# Patient Record
Sex: Female | Born: 1940 | Race: White | Hispanic: No | Marital: Married | Smoking: Never smoker
Health system: Southern US, Community
[De-identification: ages and names within clinical notes are randomized; demographics above are authoritative.]

## PROBLEM LIST (undated history)

## (undated) DIAGNOSIS — Z973 Presence of spectacles and contact lenses: Secondary | ICD-10-CM

## (undated) DIAGNOSIS — I2699 Other pulmonary embolism without acute cor pulmonale: Secondary | ICD-10-CM

## (undated) DIAGNOSIS — Z1379 Encounter for other screening for genetic and chromosomal anomalies: Secondary | ICD-10-CM

## (undated) DIAGNOSIS — I82409 Acute embolism and thrombosis of unspecified deep veins of unspecified lower extremity: Secondary | ICD-10-CM

## (undated) DIAGNOSIS — G43909 Migraine, unspecified, not intractable, without status migrainosus: Secondary | ICD-10-CM

## (undated) DIAGNOSIS — C50919 Malignant neoplasm of unspecified site of unspecified female breast: Secondary | ICD-10-CM

## (undated) DIAGNOSIS — F0390 Unspecified dementia without behavioral disturbance: Secondary | ICD-10-CM

## (undated) DIAGNOSIS — I639 Cerebral infarction, unspecified: Secondary | ICD-10-CM

## (undated) DIAGNOSIS — Z87448 Personal history of other diseases of urinary system: Secondary | ICD-10-CM

## (undated) DIAGNOSIS — K08109 Complete loss of teeth, unspecified cause, unspecified class: Secondary | ICD-10-CM

## (undated) DIAGNOSIS — B0229 Other postherpetic nervous system involvement: Secondary | ICD-10-CM

## (undated) DIAGNOSIS — I5032 Chronic diastolic (congestive) heart failure: Secondary | ICD-10-CM

## (undated) DIAGNOSIS — M14671 Charcot's joint, right ankle and foot: Secondary | ICD-10-CM

## (undated) DIAGNOSIS — G459 Transient cerebral ischemic attack, unspecified: Secondary | ICD-10-CM

## (undated) DIAGNOSIS — M1990 Unspecified osteoarthritis, unspecified site: Secondary | ICD-10-CM

## (undated) DIAGNOSIS — E119 Type 2 diabetes mellitus without complications: Secondary | ICD-10-CM

## (undated) DIAGNOSIS — R112 Nausea with vomiting, unspecified: Secondary | ICD-10-CM

## (undated) DIAGNOSIS — I2510 Atherosclerotic heart disease of native coronary artery without angina pectoris: Secondary | ICD-10-CM

## (undated) DIAGNOSIS — R51 Headache: Secondary | ICD-10-CM

## (undated) DIAGNOSIS — I509 Heart failure, unspecified: Secondary | ICD-10-CM

## (undated) DIAGNOSIS — F419 Anxiety disorder, unspecified: Secondary | ICD-10-CM

## (undated) DIAGNOSIS — G2581 Restless legs syndrome: Secondary | ICD-10-CM

## (undated) DIAGNOSIS — K219 Gastro-esophageal reflux disease without esophagitis: Secondary | ICD-10-CM

## (undated) DIAGNOSIS — K922 Gastrointestinal hemorrhage, unspecified: Secondary | ICD-10-CM

## (undated) DIAGNOSIS — G3184 Mild cognitive impairment, so stated: Secondary | ICD-10-CM

## (undated) DIAGNOSIS — E1149 Type 2 diabetes mellitus with other diabetic neurological complication: Secondary | ICD-10-CM

## (undated) DIAGNOSIS — N189 Chronic kidney disease, unspecified: Secondary | ICD-10-CM

## (undated) DIAGNOSIS — I10 Essential (primary) hypertension: Secondary | ICD-10-CM

## (undated) DIAGNOSIS — Z972 Presence of dental prosthetic device (complete) (partial): Secondary | ICD-10-CM

## (undated) DIAGNOSIS — I959 Hypotension, unspecified: Secondary | ICD-10-CM

## (undated) DIAGNOSIS — Z923 Personal history of irradiation: Secondary | ICD-10-CM

## (undated) DIAGNOSIS — Z9861 Coronary angioplasty status: Secondary | ICD-10-CM

## (undated) DIAGNOSIS — M109 Gout, unspecified: Secondary | ICD-10-CM

## (undated) DIAGNOSIS — C801 Malignant (primary) neoplasm, unspecified: Secondary | ICD-10-CM

## (undated) DIAGNOSIS — M79671 Pain in right foot: Secondary | ICD-10-CM

## (undated) DIAGNOSIS — G629 Polyneuropathy, unspecified: Secondary | ICD-10-CM

## (undated) DIAGNOSIS — E785 Hyperlipidemia, unspecified: Secondary | ICD-10-CM

## (undated) DIAGNOSIS — M79672 Pain in left foot: Secondary | ICD-10-CM

## (undated) DIAGNOSIS — N184 Chronic kidney disease, stage 4 (severe): Secondary | ICD-10-CM

## (undated) DIAGNOSIS — M14672 Charcot's joint, left ankle and foot: Secondary | ICD-10-CM

## (undated) DIAGNOSIS — D649 Anemia, unspecified: Secondary | ICD-10-CM

## (undated) HISTORY — DX: Encounter for other screening for genetic and chromosomal anomalies: Z13.79

## (undated) HISTORY — PX: CARDIAC CATHETERIZATION: SHX172

## (undated) HISTORY — PX: APPENDECTOMY: SHX54

## (undated) HISTORY — DX: Personal history of other diseases of urinary system: Z87.448

## (undated) HISTORY — PX: BLADDER SUSPENSION: SHX72

## (undated) HISTORY — DX: Hyperlipidemia, unspecified: E78.5

## (undated) HISTORY — PX: JOINT REPLACEMENT: SHX530

## (undated) HISTORY — PX: CORONARY ANGIOPLASTY: SHX604

## (undated) HISTORY — PX: CHOLECYSTECTOMY: SHX55

## (undated) HISTORY — DX: Atherosclerotic heart disease of native coronary artery without angina pectoris: Z98.61

## (undated) HISTORY — PX: DILATION AND CURETTAGE OF UTERUS: SHX78

## (undated) HISTORY — DX: Type 2 diabetes mellitus with other diabetic neurological complication: E11.49

## (undated) HISTORY — PX: ABDOMINAL HYSTERECTOMY: SHX81

## (undated) HISTORY — DX: Essential (primary) hypertension: I10

## (undated) HISTORY — DX: Anxiety disorder, unspecified: F41.9

## (undated) HISTORY — DX: Atherosclerotic heart disease of native coronary artery without angina pectoris: I25.10

## (undated) NOTE — *Deleted (*Deleted)
Speech Language Pathology Assessment and Plan  Patient Details  Name: [name] MRN: [id number] Date of Birth: [birth year]  SLP Diagnosis:    Rehab Potential:   ELOS:      {chl ip rehab slp time calculations:304100500}   Hospital Problem: Principal Problem:   Subdural hemorrhage [facility])  Past Medical History:  Past Medical History:  Diagnosis Date  . Anemia    have received iron infusions  . Anxiety   . Arthritis     osteoarthritis  . Breast cancer ([place])   . Breast cancer in female [facility])    Bilateral  . CAD S/P percutaneous coronary angioplasty 2007; March 2011   [facility] 3.0 mm 20 mm postdilated 3.6 mm in early mid LAD; status post ISR Cutting Balloon PTCA and March 11 along with PCI of distal mid lesion with a 3.0 mm 12 mm MultiLink vision BMS; the proximal stent causes jailing of SP1 and SP2 with ostial 70-80% lesions  . Cancer (HCC)    Melanoma, Squamous cell Carcinoma  . Charcot's joint of foot, right   . Charcot's joint of left foot   . Chronic diastolic CHF (congestive heart failure), NYHA class 2 ([place]) 02/28/2018  . Chronic kidney disease    stage 2   . Coronary artery disease   . Dementia ([place])    mild  . Diabetes mellitus type 2 with neurological manifestations ([place])   . Diabetes mellitus without complication ([place])   . Dyslipidemia, goal LDL below 70   . Foot pain, bilateral   . Full dentures   . Genetic testing 12/03/2016   Germline genetic testing was performed through [employer] Common Hereditary Cancers Panel + [facility] Melanoma Panel. This custom panel includes analysis of the following 51 genes: APC, ATM, AXIN2, BAP1, BARD1, BMPR1A, BRCA1, BRCA2, BRIP1, CDH1, CDK4, CDKN2A, CHEK2, CTNNA1, DICER1, EPCAM, GREM1, HOXB13, KIT, MEN1, MITF, MLH1, MSH2, MSH3, MSH6, MUTYH, NBN, NF1, NTHL1, PALB2, PDGFRA, PMS2, POLD1, POL  . GERD (gastroesophageal reflux disease)   . Gout   . Headache(784.0)   . History of hematuria    Followed by Dr. Gaynelle Arabian  .  Hypertension, essential, benign   . Migraine   . Mild cognitive impairment   . Peripheral neuropathy   . Personal history of radiation therapy   . Post herpetic neuralgia   . Restless leg syndrome   . Stroke ([place])   . TIA (transient ischemic attack)    multiple in the past  . TIA (transient ischemic attack)   . Wears glasses    Past Surgical History:  Past Surgical History:  Procedure Laterality Date  . ABDOMINAL HYSTERECTOMY    . ANKLE FUSION Right 02/22/2018   Procedure: RIGHT SUBTALAR AND TALONAVICULAR FUSION;  Surgeon: Newt Minion, MD;  Location: [place];  Service: Orthopedics;  Laterality: Right;  . APPENDECTOMY    . BLADDER SUSPENSION    . BREAST LUMPECTOMY Bilateral 2018  . BREAST LUMPECTOMY WITH RADIOACTIVE SEED AND SENTINEL LYMPH NODE BIOPSY Bilateral 12/14/2016   Procedure: BILATERAL BREAST LUMPECTOMY WITH BILATERAL  RADIOACTIVE SEED AND BILATERAL AXILLARY  SENTINEL LYMPH NODE BIOPSY ERAS PATHWAY;  Surgeon: Alphonsa Overall, MD;  Location: [place];  Service: General;  Laterality: Bilateral;  PECTORAL BLOCK  . CARDIAC CATHETERIZATION  02/24/2006   85% stenosis in the proximal portion of LAD-arrangements made for PCI on 02/25/2006  . CARDIAC CATHETERIZATION  02/25/2006   80% LAD lesion stented with a 3x66mm [place] stent resulting in reduction of 80% lesion to 0% residual  .  CARDIAC CATHETERIZATION  04/26/2006   Medical management  . CARDIAC CATHETERIZATION  06/24/2009   60-70% re-stenosis in the proximal LAD. A 3.25x15 cutting balloon, 3 inflations - 14atm-38sec, 13atm-39sec, and 12atm-40sec reduced to less than 10%. 60% stenosis of the mid/distal LAD stented with a 3x46mm Multilink stent.  Marland Kitchen CARDIAC CATHETERIZATION  07/09/2009   Medical management  . CARDIAC CATHETERIZATION    . CAROTID DOPPLER  06/24/2009   40-59% R ICA stenosis. No significant ICA stenosis noted  . CHOLECYSTECTOMY    . CORONARY ANGIOPLASTY    . DILATION AND CURETTAGE OF UTERUS    . HIP ARTHROPLASTY Left  02/10/2020   Procedure: ARTHROPLASTY BIPOLAR HIP (HEMIARTHROPLASTY) POSTERIOR LATERAL;  Surgeon: [name], MD;  Location: [place];  Service: Orthopedics;  Laterality: Left;  . JOINT REPLACEMENT Left   . LESION REMOVAL Right 03/11/2015   Procedure:  EXCISION OF RIGHT PRE TIBIAL LESION;  Surgeon: Judeth Horn, MD;  Location: [place];  Service: General;  Laterality: Right;  . MASS EXCISION Left 06/10/2014   Procedure: EXCISION LEFT LOWER LEG LESION;  Surgeon: Doreen Salvage, MD;  Location: [place];  Service: General;  Laterality: Left;  Marland Kitchen MASS EXCISION Left 09/05/2015   Procedure: EXCISION OF LEFT FOREARM  MASS;  Surgeon: Judeth Horn, MD;  Location: [place];  Service: General;  Laterality: Left;  . NM MYOVIEW LTD  10/2011   No ischemia or infarction.  Normal EF.  LOW RISK. (Short bursts of 5 beats NSVT during recovery otherwise normal)  . SHOULDER ARTHROSCOPY  10/15  . SHOULDER ARTHROSCOPY  1995   left  . TEE WITHOUT CARDIOVERSION N/A 08/03/2017   Procedure: TRANSESOPHAGEAL ECHOCARDIOGRAM (TEE);  Surgeon: Skeet Latch, MD;  Location: [place];  Service: Cardiovascular;; EF 60-65% with no wall motion normalities.  No atrial thrombus noted.  Lightly findings consistent with a lipomatous hypertrophy of the atrial septum.   . TENDON REPAIR Left 05/12/2016   Procedure: Left Anterior Tibial Tendon Reconstruction;  Surgeon: Newt Minion, MD;  Location: [place];  Service: Orthopedics;  Laterality: Left;  . TOTAL KNEE ARTHROPLASTY  2005   left  . TRANSESOPHAGEAL ECHOCARDIOGRAM  08/03/2016   : EF 60-65% no wall motion normalities.  No atrial thrombus.  Lipomatous hypertrophy of the atrial septum.  No mass noted.  . TRANSESOPHAGEAL ECHOCARDIOGRAM  07/2017   EF 60 to 65%.  No R WMA.  No atrial thrombus noted.  Lipomatous IAS.  Marland Kitchen TRANSTHORACIC ECHOCARDIOGRAM  02/25/2017   :EF 65-70%.  GR 1 DD.  No or WMA.  Moderate aortic calcification/sclerosis but  no stenosis.  . TRANSTHORACIC ECHOCARDIOGRAM  07/2017   Vigorous LV function.  EF 65-70%.  Mild diastolic dysfunction with no RWMA. GR 1 DD.  Mild aortic sclerosis/no stenosis.  Questionable right atrial mass discounted with TEE)   . 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), CAD s/p stent, chronic diastolic CHF- chronic BLE edema, peripheral neuropathy who was admitted on 02/08/20 after sustaining a fall while getting out of the shower. She had onset of left hip pain as well as scalp contusion but No LOC or CP. She was found to have acute left subcapital femoral neck fracture with displacement and angulation. CT head with subacute/remote cortical infarct right frontal  lobe with advanced atlantodental and cervical DDD.  MRI brain done and revealed thin acute right frontal lobe SDH with moderate stable small vessel disease and suspected 5 mm incidental meningioma. MRA brain ordered due to concerns of stroke and showed moderate to severe stenosis bilateral cavernous ICA, moderate focal stenosis proximal M2 L-MCA and mild/moderate stenosis L-PCA at P3/P4 junction.  [name] consulted for input and questioned if SDH present as patient without neurological symptoms and no follow up recommended. She underwent left hip bipolar arthroplasty by [name] on 11/14. Post op to be WBAT and on ASA/Plavix for stroke and DVT prophylaxis.  Post op with ABLA treated with one unit PRBC as well as acute hypoxic respiratory failure requiring venturi mask briefly, DOE, anxiety issues--on valium prn as well as urinary retention with I/O caths at [phone] cc. She was started on urecholine and foley placed 11/18.  Hypoxia felt to be due to fluid overload and treated with IV lasix urine. Respiratory status improved being weaned off oxygen.  Therapy ongoing and patient limited by dizziness, fatigue as well as inability to recall hip precautions. Rehab CM also reporting that patient had transient episodes audible hallucinations on her past two visits. Pt admitted to CIR 02/15/20 and SLP evaluation completed 02/17/20 with results as follows:  Pt   Recommend   Skilled Therapeutic Interventions          ***  SLP Assessment       Recommendations       SLP Frequency     SLP Duration  SLP Intensity  SLP Treatment/Interventions            Pain    Prior Functioning    SLP Evaluation Cognition    Comprehension   Expression   Oral Motor    Care Tool Care Tool Cognition Expression of Ideas and Wants Expression of Ideas and Wants: Some difficulty - exhibits some difficulty with expressing needs and ideas (e.g, some words or finishing thoughts) or speech is not clear   Understanding Verbal and Non-Verbal Content Understanding Verbal and Non-Verbal Content: Usually understands - understands most conversations, but misses some part/intent of message. Requires cues at times to understand   Memory/Recall Ability *first 3 days only       PMSV Assessment  PMSV Trial    Bedside Swallowing Assessment General    Oral Care Assessment   Ice Chips   Thin Liquid   Nectar Thick   Honey Thick   Puree   Solid   BSE Assessment    Short Term Goals: [id number]  Refer to Care Plan for Long Term Goals  Recommendations for other services: {RECOMMENDATIONS FOR OTHER SERVICES:3049016}  Discharge Criteria: Patient will be discharged from SLP if patient refuses treatment 3 consecutive times without medical reason, if treatment goals not met, if there is a change in medical status, if patient makes no progress towards goals or if patient is discharged from hospital.  The above assessment, treatment plan, treatment alternatives and goals were discussed and mutually agreed upon: {Assessment/Treatment Plan  Discussed/Agreed:[id number]}  Arbutus Leas 02/17/2020, 7:45 AM

## (undated) NOTE — *Deleted (*Deleted)
Physical Medicine and Rehabilitation Admission H&P    Chief Complaint  Patient presents with  . Left hip fracture with functional deficits.     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), CAD s/p stent, chronic diastolic CHF- chronic BLE edema, peripheral neuropathy who was admitted on 02/08/20 after sustaining a fall while getting out of the shower. She had onset of left hip pain as well as scalp contusion but No LOC or CP. She was found to have acute left subcapital femoral neck fracture with displacement and angulation. CT head with subacute/remote cortical infarct right frontal lobe with advanced atlantodental and cervical DDD.  MRI brain done and revealed thin acute right frontal lobe SDH with moderate stable small vessel disease and suspected 5 mm incidental meningioma. MRA brain ordered due to concerns of stroke and showed moderate to severe stenosis bilateral cavernous ICA, moderate focal stenosis proximal M2 L-MCA and mild/moderate stenosis L-PCA at P3/P4 junction.   [name] consulted for input and questioned if SDH present as patient without neurological symptoms and no follow up recommended. She underwent left hip bipolar arthroplasty by [name] on 11/14. Post op to be WBAT and on ASA/Plavix for stroke and DVT prophylaxis.  Post op with ABLA treated with one unit PRBC as well as acute hypoxic respiratory failure requiring venturi mask briefly, DOE, anxiety issues--on valium prn as well as urinary retention with I/O caths at [phone] cc. She was started on urecholine and foley placed 11/18.  Hypoxia felt to be due to fluid overload and treated with IV lasix urine. Respiratory status improved being weaned off oxygen. Therapy ongoing and patient limited by dizziness, fatigue as well as inability to recall hip precautions. Rehab CM also reporting that patient had transient episodes audible hallucinations on  her past two visits. CIR recommended due to functional decline.    Review of Systems  Constitutional: Negative for chills and fever.  HENT: Negative for hearing loss and tinnitus.   Eyes: Negative for blurred vision and double vision.  Respiratory: Positive for shortness of breath. Negative for cough and stridor.   Cardiovascular: Positive for palpitations and leg swelling. Negative for chest pain.  Gastrointestinal: Positive for abdominal pain, heartburn and nausea.  Genitourinary:       Gets up 1-2 times at nights to urinate.   Musculoskeletal: Positive for joint pain and myalgias.  Neurological: Negative for dizziness and headaches.  Psychiatric/Behavioral: Positive for memory loss (mild cognitive deficits at baseline). The patient has insomnia (Got up on and off at nights PTA. ).      Past Medical History:  Diagnosis Date  . Breast cancer ([place])   . Charcot's joint of foot, right   . Charcot's joint of left foot   . Coronary artery disease   . Diabetes mellitus without complication ([place])   . Foot pain, bilateral   . Gout   . Migraine   . Mild cognitive impairment   . Peripheral neuropathy   . Post herpetic neuralgia   . Restless leg syndrome   . TIA (transient ischemic attack)     Past Surgical History:  Procedure Laterality Date  . CARDIAC CATHETERIZATION    . CORONARY ANGIOPLASTY    . HIP ARTHROPLASTY Left 02/10/2020   Procedure: ARTHROPLASTY BIPOLAR HIP (HEMIARTHROPLASTY) POSTERIOR LATERAL;  Surgeon: [name], MD;  Location: [facility];  Service: Orthopedics;  Laterality: Left;    Family  History  Problem Relation Age of Onset  . Diabetes Mother   . Kidney disease Mother   . Diabetes Sister   . Diabetes Brother     Social History: Is married--husband manages medications. She was independent with cane or walker/wheelchair on bad days to prevent falls. She reports that she has never smoked. She has never used smokeless tobacco. She reports that she does not  drink alcohol and does not use drugs.    Allergies:  Allergies  Allergen Reactions  . Nsaids Nausea And Vomiting and Palpitations  . Reglan [Metoclopramide] Other (See Comments)    Seizures   . Tramadol Other (See Comments)    Seizures   . Lipitor [Atorvastatin] Swelling and Other (See Comments)    Pain   . Lyrica [Pregabalin] Swelling  . Urecholine [Bethanechol]     Palpitations and nausea      Medications Prior to Admission  Medication Sig Dispense Refill  . BIOTIN PO Take 1 tablet by mouth daily.    . Cholecalciferol (VITAMIN D) 50 MCG (2000 UT) tablet Take 2,000 Units by mouth daily.    . clopidogrel (PLAVIX) 75 MG tablet Take 75 mg by mouth daily.    . Coenzyme Q10 (COQ-10 PO) Take 1 tablet by mouth daily.    . colchicine 0.6 MG tablet Take 0.6 mg by mouth daily as needed (for up to 3 days for gout flare).    . CRANBERRY-CALCIUM PO Take 1 tablet by mouth daily.    . diazepam (VALIUM) 5 MG tablet Take 5 mg by mouth 3 (three) times daily as needed.    . febuxostat (ULORIC) 40 MG tablet Take 40 mg by mouth daily.    Marland Kitchen gabapentin (NEURONTIN) 300 MG capsule Take 300 mg by mouth 2 (two) times daily.     Marland Kitchen JANUVIA 25 MG tablet Take 25 mg by mouth daily.    Marland Kitchen KRILL OIL PO Take 1 capsule by mouth daily.    . Methylfol-Methylcob-Acetylcyst (CEREFOLIN NAC) 6-2-600 MG TABS Take 1 tablet by mouth in the morning.    . metolazone (ZAROXOLYN) 2.5 MG tablet Take 2.5 mg by mouth daily. 30 minutes before Bumetanide Mon. & Thurs.    . metoprolol succinate (TOPROL-XL) 25 MG 24 hr tablet Take 25 mg by mouth at bedtime.     . mirtazapine (REMERON) 15 MG tablet Take 15 mg by mouth at bedtime.     . nitroGLYCERIN (NITROLINGUAL) 0.4 MG/SPRAY spray Place 1 spray under the tongue every 5 (five) minutes x 3 doses as needed for chest pain.    . pramipexole (MIRAPEX) 0.25 MG tablet Take 0.75 mg by mouth at bedtime.     Marland Kitchen Resveratrol 250 MG CAPS Take 500 mg by mouth daily.    . rosuvastatin (CRESTOR) 20  MG tablet Take 20 mg by mouth daily.      Drug Regimen Review { DRUG REGIMEN [id number]  Home: Home Living Family/patient expects to be discharged to:: Inpatient rehab Living Arrangements: Spouse/significant other Additional Comments: Pt lives in ones story home with 3 steps to enter, has both tub-shower and walk in shower with built-in seats and grab bars. Has a RW and Cane   Functional History: Prior Function Level of Independence: Independent with assistive device(s) (pt reports furniture walking or intermittent use of cane) Comments: Pt could not drive but was able to do most other adls other than cooking bc her husband does this. Pt reports use of cane or furniture as RW does not fit  inher house  Functional Status:  Mobility: Bed Mobility Overal bed mobility: Needs Assistance Bed Mobility: Supine to Sit, Sit to Supine Supine to sit: Max assist, +2 for physical assistance Sit to supine: Total assist, +2 for physical assistance General bed mobility comments: Up in chair upon arrival. Transfers Overall transfer level: Needs assistance Equipment used: Rolling walker (2 wheeled) Transfers: Sit to/from Stand Sit to Stand: Mod assist General transfer comment: Assist to power to standing with cues for hand placement, slow to rise, cues to stand fully upright, stated dizziness but BP improved with O2 remaining stable at 1L Ambulation/Gait Ambulation/Gait assistance: Min assist Gait Distance (Feet): 3 Feet Assistive device: Rolling walker (2 wheeled) Gait Pattern/deviations: Step-through pattern, Decreased stride length General Gait Details: pt with limited lateral stepping to bed from recliner. Reports 7/10 fatigue but also stating she would like to walk to bathroom.  Gait velocity interpretation: <1.31 ft/sec, indicative of household ambulator    ADL: ADL Overall ADL's : Needs assistance/impaired Eating/Feeding: Set up, Sitting, Cueing for safety Grooming: Wash/dry hands,  Wash/dry face, Oral care, Set up, Bed level Grooming Details (indicate cue type and reason): Pt unable to stand at sink at this time. EOB only due to pain. Upper Body Bathing: Set up, Bed level, Cueing for safety Lower Body Bathing: Total assistance, +2 for physical assistance, Sitting/lateral leans, Adhering to hip precautions Lower Body Bathing Details (indicate cue type and reason): Pt with posterior hip precuations that she does not yet understand.  Unable to get into complete standing due to pain and increased heart rate.  Upper Body Dressing : Minimal assistance, Sitting, Cueing for compensatory techniques Lower Body Dressing: Total assistance, +2 for physical assistance, Adhering to hip precautions, Sit to/from stand, Cueing for compensatory techniques Lower Body Dressing Details (indicate cue type and reason): Pt requires further education with hip precuations and introduction to adaptive equipment. Toilet Transfer Details (indicate cue type and reason): attempted to stand to get to [facility]. Pt with increased HR to 126 and decreased O2 sats to 85% despite increasing O2 earlier in session to 3L. Toileting- Clothing Manipulation and Hygiene: Total assistance, +2 for physical assistance, Adhering to hip precautions, Sitting/lateral lean Functional mobility during ADLs: Moderate assistance, +2 for physical assistance, +2 for safety/equipment General ADL Comments: Pt unable to stand or complete any LE adls or toileting adls due to pain, and dizziness, stating she just doesnt feel like herself, BP more stable in session to date  Cognition: Cognition Overall Cognitive Status: No family/caregiver present to determine baseline cognitive functioning Orientation Level: Oriented X4 Cognition Arousal/Alertness: Awake/alert Behavior During Therapy: Anxious Overall Cognitive Status: No family/caregiver present to determine baseline cognitive functioning General Comments: Remains anxious with dyspnea with  mobility, also unable to state hip precuations, otherwise oriented and following commands   Blood pressure 137/88, pulse 97, temperature 98.2 F (36.8 C), temperature source Oral, resp. rate 18, height 5\' 6"  (1.676 m), weight 89.4 kg, SpO2 98 %. Physical Exam Vitals and nursing note reviewed.  Constitutional:      Appearance: Normal appearance.     Comments: Anxious female. Increased WOB with conversation and bouts of anxiety.  On 2L oxygen per [place].   Cardiovascular:     Rate and Rhythm: Tachycardia present.  Musculoskeletal:     Comments: 2+ lymphedema BLE.   Neurological:     Mental Status: She is alert.     Comments: Anxious , distracted and mildly disoriented ---occasional inappropriate comments and daughter needed to cue patient to answer  medical hx and to help calm the patient.      Results for orders placed or performed during the hospital encounter of 02/08/20 (from the past 48 hour(s))  Glucose, capillary     Status: Abnormal   Collection Time: 02/13/20  4:23 PM  Result Value Ref Range   Glucose-Capillary 161 (H) 70 - 99 mg/dL    Comment: Glucose reference range applies only to samples taken after fasting for at least 8 hours.  Glucose, capillary     Status: Abnormal   Collection Time: 02/13/20  9:31 PM  Result Value Ref Range   Glucose-Capillary 161 (H) 70 - 99 mg/dL    Comment: Glucose reference range applies only to samples taken after fasting for at least 8 hours.  Basic metabolic panel     Status: Abnormal   Collection Time: 02/14/20  1:01 AM  Result Value Ref Range   Sodium 138 135 - 145 mmol/L   Potassium 3.8 3.5 - 5.1 mmol/L   Chloride 103 98 - 111 mmol/L   CO2 25 22 - 32 mmol/L   Glucose, Bld 196 (H) 70 - 99 mg/dL    Comment: Glucose reference range applies only to samples taken after fasting for at least 8 hours.   BUN 42 (H) 8 - 23 mg/dL   Creatinine, Ser 1.45 (H) 0.44 - 1.00 mg/dL   Calcium 8.8 (L) 8.9 - 10.3 mg/dL   GFR, Estimated 36 (L) >60 mL/min     Comment: (NOTE) Calculated using the CKD-EPI Creatinine Equation (2021)    Anion gap 10 5 - 15    Comment: Performed at [facility] [address], [place], [place] [address]  CBC     Status: Abnormal   Collection Time: 02/14/20  1:01 AM  Result Value Ref Range   WBC 12.4 (H) 4.0 - 10.5 K/uL   RBC 3.53 (L) 3.87 - 5.11 MIL/uL   Hemoglobin 10.1 (L) 12.0 - 15.0 g/dL   HCT 31.3 (L) 36 - 46 %   MCV 88.7 80.0 - 100.0 fL   MCH 28.6 26.0 - 34.0 pg   MCHC 32.3 30.0 - 36.0 g/dL   RDW 15.0 11.5 - 15.5 %   Platelets 197 150 - 400 K/uL   nRBC 0.0 0.0 - 0.2 %    Comment: Performed at [facility], [place] [address] [place], [place] [address]  Magnesium     Status: Abnormal   Collection Time: 02/14/20  1:01 AM  Result Value Ref Range   Magnesium 2.5 (H) 1.7 - 2.4 mg/dL    Comment: Performed at [facility] [address], [place], [place] [address]  Glucose, capillary     Status: Abnormal   Collection Time: 02/14/20  8:19 AM  Result Value Ref Range   Glucose-Capillary 187 (H) 70 - 99 mg/dL    Comment: Glucose reference range applies only to samples taken after fasting for at least 8 hours.  Glucose, capillary     Status: Abnormal   Collection Time: 02/14/20 11:59 AM  Result Value Ref Range   Glucose-Capillary 157 (H) 70 - 99 mg/dL    Comment: Glucose reference range applies only to samples taken after fasting for at least 8 hours.  Glucose, capillary     Status: Abnormal   Collection Time: 02/14/20  4:28 PM  Result Value Ref Range   Glucose-Capillary 150 (H) 70 - 99 mg/dL    Comment: Glucose reference range applies only to samples taken  after fasting for at least 8 hours.  Glucose, capillary     Status: Abnormal   Collection Time: 02/14/20  9:25 PM  Result Value Ref Range   Glucose-Capillary 162 (H) 70 - 99 mg/dL    Comment: Glucose reference range applies only to samples taken after fasting for at least 8 hours.   Comment 1 Notify RN    Comment 2 Document in Chart    CBC     Status: Abnormal   Collection Time: 02/15/20  3:58 AM  Result Value Ref Range   WBC 12.8 (H) 4.0 - 10.5 K/uL   RBC 3.14 (L) 3.87 - 5.11 MIL/uL   Hemoglobin 8.9 (L) 12.0 - 15.0 g/dL   HCT 28.3 (L) 36 - 46 %   MCV 90.1 80.0 - 100.0 fL   MCH 28.3 26.0 - 34.0 pg   MCHC 31.4 30.0 - 36.0 g/dL   RDW 14.9 11.5 - 15.5 %   Platelets 243 150 - 400 K/uL   nRBC 0.0 0.0 - 0.2 %    Comment: Performed at [facility], [place] [address] [place], [place] [id number]  Basic metabolic panel     Status: Abnormal   Collection Time: 02/15/20  3:58 AM  Result Value Ref Range   Sodium 140 135 - 145 mmol/L   Potassium 3.4 (L) 3.5 - 5.1 mmol/L   Chloride 106 98 - 111 mmol/L   CO2 27 22 - 32 mmol/L   Glucose, Bld 180 (H) 70 - 99 mg/dL    Comment: Glucose reference range applies only to samples taken after fasting for at least 8 hours.   BUN 47 (H) 8 - 23 mg/dL   Creatinine, Ser 1.35 (H) 0.44 - 1.00 mg/dL   Calcium 8.5 (L) 8.9 - 10.3 mg/dL   GFR, Estimated 39 (L) >60 mL/min    Comment: (NOTE) Calculated using the CKD-EPI Creatinine Equation (2021)    Anion gap 7 5 - 15    Comment: Performed at [facility] [address], [place], [place] [address]  Glucose, capillary     Status: Abnormal   Collection Time: 02/15/20  7:49 AM  Result Value Ref Range   Glucose-Capillary 211 (H) 70 - 99 mg/dL    Comment: Glucose reference range applies only to samples taken after fasting for at least 8 hours.  Glucose, capillary     Status: Abnormal   Collection Time: 02/15/20 11:48 AM  Result Value Ref Range   Glucose-Capillary 164 (H) 70 - 99 mg/dL    Comment: Glucose reference range applies only to samples taken after fasting for at least 8 hours.   No results found.     Medical Problem List and Plan: 1.  *** secondary to ***  -patient may *** shower  -ELOS/Goals: *** 2.  Antithrombotics: -DVT/anticoagulation:  {VTE PROPHYLAXIS/ANTICOAGULATION - [id number]  -antiplatelet therapy: *** 3.  Neuropathy/charcot feet/Pain Management: Uses braces and gabapentin for pain.  4. Mood: LCSW to follow for evaluation and support.   -antipsychotic agents: N/A 5. Neuropsych: This patient is not fully capable of making decisions on her own behalf. 6. Skin/Wound Care: Monitor incision for healing. Continue supplements to promote wound healing.  7. Fluids/Electrolytes/Nutrition: Monitor I/O. Change glucerna to with meals.  8. Left femur fracture: WBAT with posterior hip precautions.  9. 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 SCr better but BUN with rise--> d/c Naprosyn.  Will order orthostatic vitals and monitor Lytes with serial checks. . 10.  CAD s/p PCI/stent: Monitor for symptoms with activity.  11. T2DM:  Hgb A1c-8.8.  Monitor BS ac/hs --resume Januvia and use SSI for elevated  BS.  12. Chronic diastolic CHF: Monitor for signs of overload--order flutter valve for pulmonary hygiene. Her oxygen is being weaned off today--order prn use with activity. Will check weights daily. On metoprolol, Plavix, Crestor and Bumex resumed 11/17.  13. Urinary retention/Leucocytosis: Check UA/UCS--d/c foley in am and start bladder training. Discontinue urecholine-->on Valium (to relax bladder). .  14. Acute blood loss anemia: Monitor H/H for trends--13.3-->8.8--> 8.9 15. Peripheral edema: 2+ edema-> good day for legs 11/19 per family.    ***  Bary Leriche, PA-C 02/15/2020

---
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
## 1993-03-29 HISTORY — PX: SHOULDER ARTHROSCOPY: SHX128

## 1997-07-10 ENCOUNTER — Other Ambulatory Visit: Admission: RE | Admit: 1997-07-10 | Discharge: 1997-07-10 | Payer: Self-pay | Admitting: *Deleted

## 1997-09-09 ENCOUNTER — Other Ambulatory Visit: Admission: RE | Admit: 1997-09-09 | Discharge: 1997-09-09 | Payer: Self-pay | Admitting: *Deleted

## 1998-08-01 ENCOUNTER — Encounter: Payer: Self-pay | Admitting: *Deleted

## 1998-08-01 ENCOUNTER — Ambulatory Visit (HOSPITAL_COMMUNITY): Admission: RE | Admit: 1998-08-01 | Discharge: 1998-08-01 | Payer: Self-pay | Admitting: *Deleted

## 1998-10-13 ENCOUNTER — Other Ambulatory Visit: Admission: RE | Admit: 1998-10-13 | Discharge: 1998-10-13 | Payer: Self-pay | Admitting: *Deleted

## 1998-12-09 ENCOUNTER — Emergency Department (HOSPITAL_COMMUNITY): Admission: EM | Admit: 1998-12-09 | Discharge: 1998-12-09 | Payer: Self-pay | Admitting: Emergency Medicine

## 1998-12-09 ENCOUNTER — Encounter: Payer: Self-pay | Admitting: Emergency Medicine

## 1998-12-11 ENCOUNTER — Ambulatory Visit: Admission: RE | Admit: 1998-12-11 | Discharge: 1998-12-11 | Payer: Self-pay | Admitting: *Deleted

## 1999-11-23 ENCOUNTER — Ambulatory Visit (HOSPITAL_COMMUNITY): Admission: RE | Admit: 1999-11-23 | Discharge: 1999-11-23 | Payer: Self-pay | Admitting: *Deleted

## 1999-12-19 ENCOUNTER — Emergency Department (HOSPITAL_COMMUNITY): Admission: EM | Admit: 1999-12-19 | Discharge: 1999-12-19 | Payer: Self-pay | Admitting: Emergency Medicine

## 1999-12-23 ENCOUNTER — Encounter: Payer: Self-pay | Admitting: General Surgery

## 1999-12-25 ENCOUNTER — Ambulatory Visit (HOSPITAL_COMMUNITY): Admission: RE | Admit: 1999-12-25 | Discharge: 1999-12-25 | Payer: Self-pay | Admitting: General Surgery

## 2000-01-15 ENCOUNTER — Encounter: Payer: Self-pay | Admitting: *Deleted

## 2000-01-15 ENCOUNTER — Encounter: Admission: RE | Admit: 2000-01-15 | Discharge: 2000-01-15 | Payer: Self-pay | Admitting: *Deleted

## 2000-02-02 ENCOUNTER — Ambulatory Visit (HOSPITAL_COMMUNITY): Admission: RE | Admit: 2000-02-02 | Discharge: 2000-02-02 | Payer: Self-pay | Admitting: *Deleted

## 2000-02-02 ENCOUNTER — Encounter: Payer: Self-pay | Admitting: *Deleted

## 2000-05-16 ENCOUNTER — Ambulatory Visit (HOSPITAL_COMMUNITY): Admission: RE | Admit: 2000-05-16 | Discharge: 2000-05-16 | Payer: Self-pay | Admitting: *Deleted

## 2000-11-16 ENCOUNTER — Encounter: Payer: Self-pay | Admitting: Emergency Medicine

## 2000-11-16 ENCOUNTER — Emergency Department (HOSPITAL_COMMUNITY): Admission: EM | Admit: 2000-11-16 | Discharge: 2000-11-16 | Payer: Self-pay | Admitting: Emergency Medicine

## 2000-12-05 ENCOUNTER — Ambulatory Visit (HOSPITAL_COMMUNITY): Admission: RE | Admit: 2000-12-05 | Discharge: 2000-12-05 | Payer: Self-pay | Admitting: Internal Medicine

## 2000-12-06 ENCOUNTER — Encounter: Admission: RE | Admit: 2000-12-06 | Discharge: 2000-12-06 | Payer: Self-pay | Admitting: *Deleted

## 2000-12-07 ENCOUNTER — Encounter: Admission: RE | Admit: 2000-12-07 | Discharge: 2000-12-07 | Payer: Self-pay | Admitting: *Deleted

## 2001-02-21 ENCOUNTER — Ambulatory Visit (HOSPITAL_COMMUNITY): Admission: RE | Admit: 2001-02-21 | Discharge: 2001-02-21 | Payer: Self-pay | Admitting: *Deleted

## 2001-03-14 ENCOUNTER — Encounter: Admission: RE | Admit: 2001-03-14 | Discharge: 2001-06-12 | Payer: Self-pay | Admitting: Family Medicine

## 2001-04-27 ENCOUNTER — Ambulatory Visit (HOSPITAL_BASED_OUTPATIENT_CLINIC_OR_DEPARTMENT_OTHER): Admission: RE | Admit: 2001-04-27 | Discharge: 2001-04-27 | Payer: Self-pay | Admitting: Orthopedic Surgery

## 2001-12-11 ENCOUNTER — Encounter: Admission: RE | Admit: 2001-12-11 | Discharge: 2001-12-11 | Payer: Self-pay | Admitting: *Deleted

## 2001-12-28 ENCOUNTER — Encounter: Admission: RE | Admit: 2001-12-28 | Discharge: 2001-12-28 | Payer: Self-pay | Admitting: Orthopedic Surgery

## 2001-12-28 ENCOUNTER — Encounter: Payer: Self-pay | Admitting: Orthopedic Surgery

## 2002-01-10 ENCOUNTER — Encounter: Payer: Self-pay | Admitting: Orthopedic Surgery

## 2002-01-10 ENCOUNTER — Encounter: Admission: RE | Admit: 2002-01-10 | Discharge: 2002-01-10 | Payer: Self-pay | Admitting: Orthopedic Surgery

## 2002-01-26 ENCOUNTER — Encounter: Payer: Self-pay | Admitting: Orthopedic Surgery

## 2002-01-26 ENCOUNTER — Encounter: Admission: RE | Admit: 2002-01-26 | Discharge: 2002-01-26 | Payer: Self-pay | Admitting: Orthopedic Surgery

## 2002-04-18 ENCOUNTER — Encounter: Admission: RE | Admit: 2002-04-18 | Discharge: 2002-04-18 | Payer: Self-pay | Admitting: *Deleted

## 2002-08-06 ENCOUNTER — Ambulatory Visit (HOSPITAL_COMMUNITY): Admission: RE | Admit: 2002-08-06 | Discharge: 2002-08-06 | Payer: Self-pay | Admitting: *Deleted

## 2002-08-06 ENCOUNTER — Encounter (INDEPENDENT_AMBULATORY_CARE_PROVIDER_SITE_OTHER): Payer: Self-pay | Admitting: Specialist

## 2003-03-30 HISTORY — PX: TOTAL KNEE ARTHROPLASTY: SHX125

## 2003-09-16 ENCOUNTER — Encounter: Admission: RE | Admit: 2003-09-16 | Discharge: 2003-09-16 | Payer: Self-pay | Admitting: Internal Medicine

## 2003-10-09 ENCOUNTER — Ambulatory Visit (HOSPITAL_COMMUNITY): Admission: RE | Admit: 2003-10-09 | Discharge: 2003-10-09 | Payer: Self-pay | Admitting: Internal Medicine

## 2004-05-05 ENCOUNTER — Encounter: Admission: RE | Admit: 2004-05-05 | Discharge: 2004-05-05 | Payer: Self-pay | Admitting: Neurological Surgery

## 2004-05-07 ENCOUNTER — Encounter: Admission: RE | Admit: 2004-05-07 | Discharge: 2004-05-07 | Payer: Self-pay | Admitting: Sports Medicine

## 2004-05-18 ENCOUNTER — Encounter: Admission: RE | Admit: 2004-05-18 | Discharge: 2004-05-18 | Payer: Self-pay | Admitting: Neurological Surgery

## 2004-05-21 ENCOUNTER — Ambulatory Visit: Payer: Self-pay | Admitting: Internal Medicine

## 2004-05-21 ENCOUNTER — Ambulatory Visit (HOSPITAL_BASED_OUTPATIENT_CLINIC_OR_DEPARTMENT_OTHER): Admission: RE | Admit: 2004-05-21 | Discharge: 2004-05-21 | Payer: Self-pay | Admitting: Internal Medicine

## 2004-06-11 ENCOUNTER — Ambulatory Visit (HOSPITAL_COMMUNITY): Admission: RE | Admit: 2004-06-11 | Discharge: 2004-06-11 | Payer: Self-pay | Admitting: Orthopedic Surgery

## 2004-06-11 ENCOUNTER — Ambulatory Visit (HOSPITAL_BASED_OUTPATIENT_CLINIC_OR_DEPARTMENT_OTHER): Admission: RE | Admit: 2004-06-11 | Discharge: 2004-06-11 | Payer: Self-pay | Admitting: Orthopedic Surgery

## 2004-10-14 ENCOUNTER — Ambulatory Visit (HOSPITAL_COMMUNITY): Admission: RE | Admit: 2004-10-14 | Discharge: 2004-10-14 | Payer: Self-pay | Admitting: Sports Medicine

## 2004-11-26 ENCOUNTER — Encounter: Admission: RE | Admit: 2004-11-26 | Discharge: 2004-11-26 | Payer: Self-pay | Admitting: Internal Medicine

## 2005-04-23 ENCOUNTER — Ambulatory Visit (HOSPITAL_BASED_OUTPATIENT_CLINIC_OR_DEPARTMENT_OTHER): Admission: RE | Admit: 2005-04-23 | Discharge: 2005-04-23 | Payer: Self-pay | Admitting: General Surgery

## 2005-04-23 ENCOUNTER — Encounter (INDEPENDENT_AMBULATORY_CARE_PROVIDER_SITE_OTHER): Payer: Self-pay | Admitting: *Deleted

## 2005-06-28 ENCOUNTER — Ambulatory Visit (HOSPITAL_COMMUNITY): Admission: RE | Admit: 2005-06-28 | Discharge: 2005-06-28 | Payer: Self-pay | Admitting: Hematology and Oncology

## 2005-06-28 ENCOUNTER — Encounter (INDEPENDENT_AMBULATORY_CARE_PROVIDER_SITE_OTHER): Payer: Self-pay | Admitting: Specialist

## 2005-07-06 ENCOUNTER — Ambulatory Visit (HOSPITAL_COMMUNITY): Admission: RE | Admit: 2005-07-06 | Discharge: 2005-07-06 | Payer: Self-pay | Admitting: Gastroenterology

## 2005-07-07 ENCOUNTER — Encounter: Admission: RE | Admit: 2005-07-07 | Discharge: 2005-07-07 | Payer: Self-pay | Admitting: General Surgery

## 2005-07-30 ENCOUNTER — Ambulatory Visit (HOSPITAL_BASED_OUTPATIENT_CLINIC_OR_DEPARTMENT_OTHER): Admission: RE | Admit: 2005-07-30 | Discharge: 2005-07-30 | Payer: Self-pay | Admitting: General Surgery

## 2005-07-30 ENCOUNTER — Encounter (INDEPENDENT_AMBULATORY_CARE_PROVIDER_SITE_OTHER): Payer: Self-pay | Admitting: Specialist

## 2005-10-29 IMAGING — MR MR CERVICAL SPINE W/O CM
4 of 7 series · 26 of 48 positions shown · IV contrast (agent unspecified)
Comparison: none

CLINICAL DATA: Left posterior shoulder pain radiating into the neck and posterior skull.  There is also some burning pain in the right shoulder with numbness into the right arm. 
 MRI OF THE CERVICAL SPINE WITHOUT CONTRAST:
 The current study is compared with the prior study performed at [REDACTED] on 12/07/01.  The scan extends from the upper clivus through T3-4.  
 The scan demonstrates degenerative disk disease at C3-4, C4-5, C5-6 and C6-7.  There is mild posterior spurring and bulging at both of those levels with fairly severe disk space narrowing at C6-7 and to a lesser degree at C4-5 and C5-6.  There is spurring into the lateral recesses and neural foramina bilaterally at C6-7, but the neural foramina are not severely narrowed and the appearance is essentially unchanged since the prior exam.  No discrete disk herniations in the cervical spine.  The cord is not compressed and there is no myelopathy.  There is diffuse moderate degenerative facet joint arthritis throughout the cervical spine, most severe at C3-4 and C4-5 and C7-T1 on the left and diffusely on the right to a lesser degree. 

 The scan does demonstrate a tiny central bulge at T2-3 and a small central subligamentous disk herniation at T3-4 without indentation upon the cord or nerve roots. This small herniation is more prominent than on the prior exam of 12/07/01.  The other findings have not changed.

[Series 2: T2 · sagittal · 3.0mm · 0.69mm/px · 6 of 11 slices shown (1 of 4)]
[im 1/11]
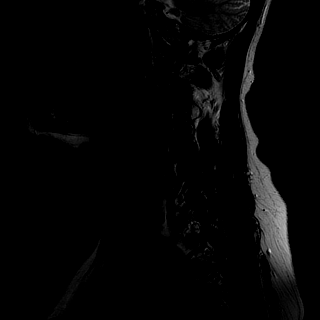
[im 3/11]
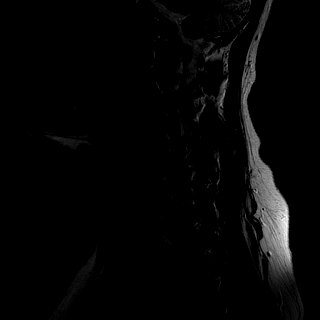
[im 5/11]
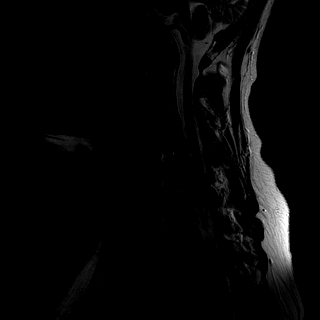
[im 7/11]
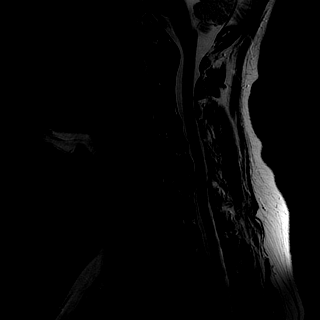
[im 9/11]
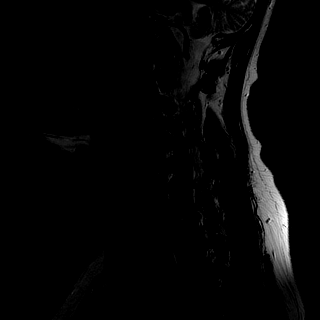
[im 11/11]
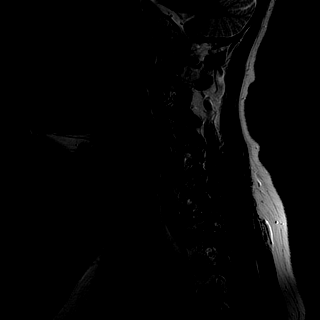

[Series 6: T2 · axial · 4.0mm · 0.39mm/px · z∈[-130,-9]mm · 11 of 20 slices shown (2 of 4)]
[im 1/20]
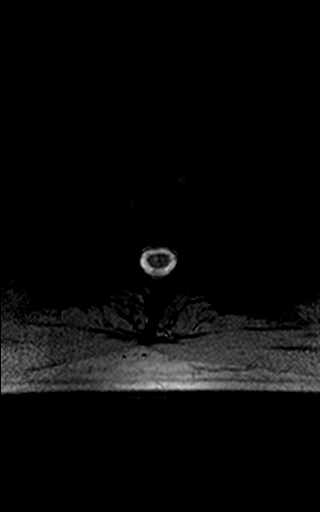
[im 2/20]
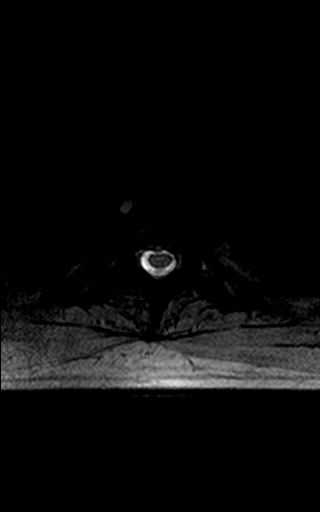
[im 4/20]
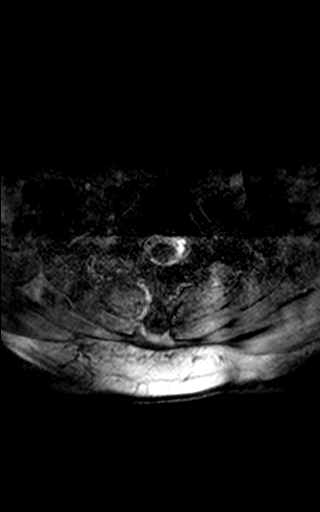
[im 6/20]
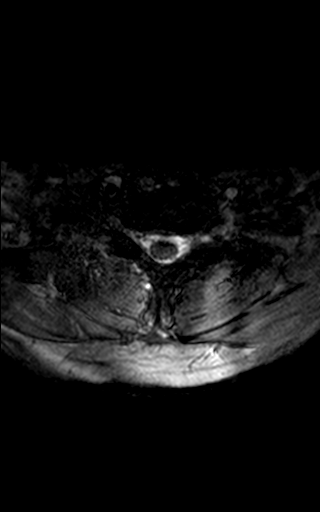
[im 8/20]
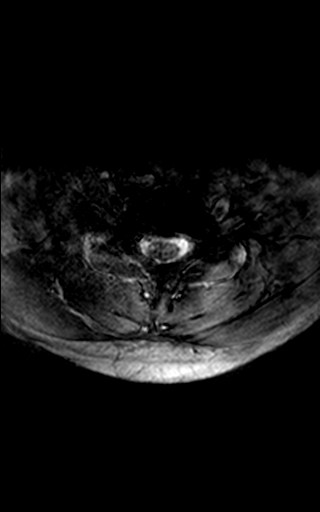
[im 10/20]
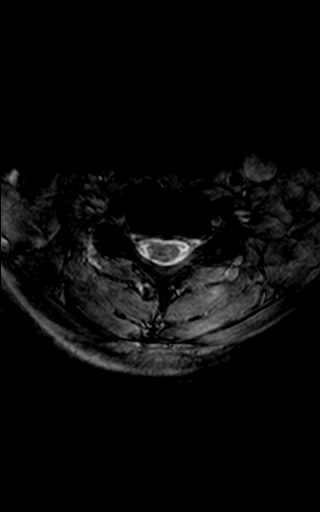
[im 12/20]
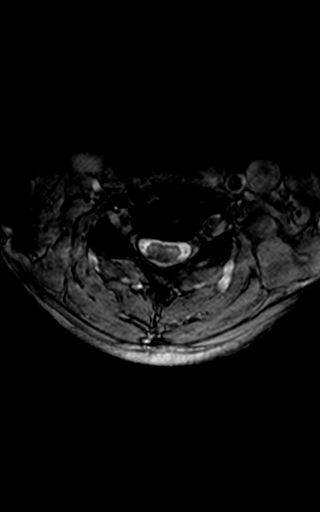
[im 14/20]
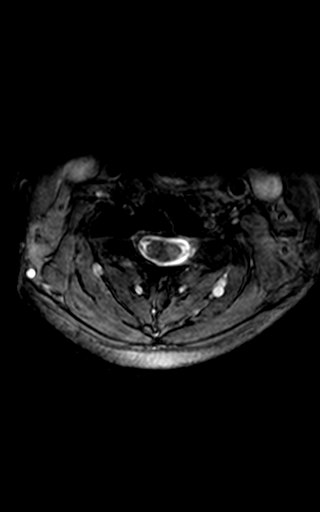
[im 16/20]
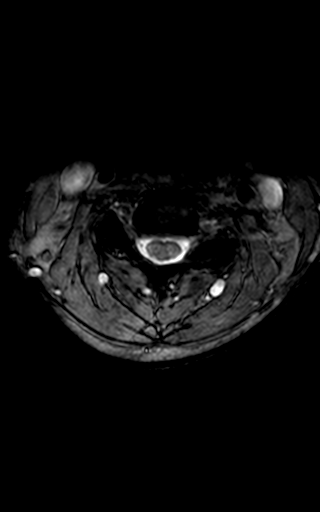
[im 18/20]
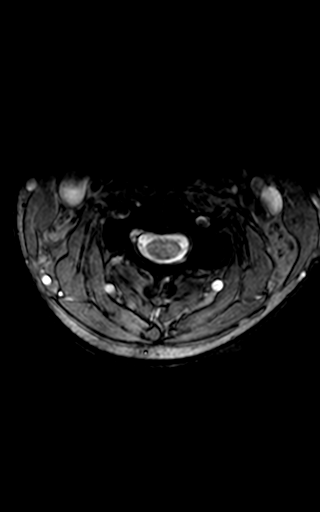
[im 20/20]
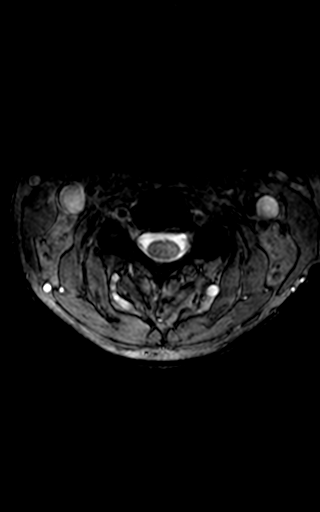

[Series 7: T2 · axial · 4.0mm · 0.39mm/px · z∈[-132,-20]mm · 7 of 20 slices shown (3 of 4)]
[im 1/20]
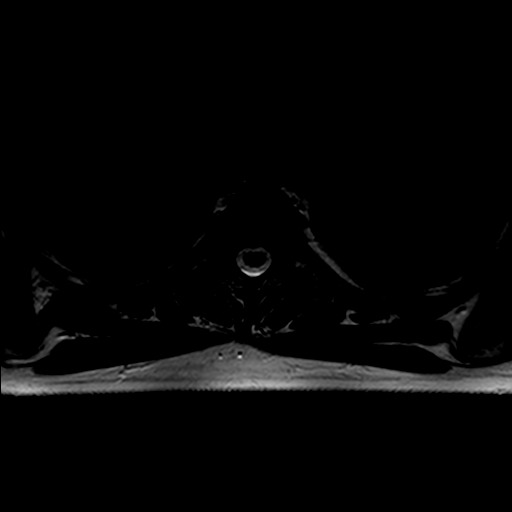
[im 2/20]
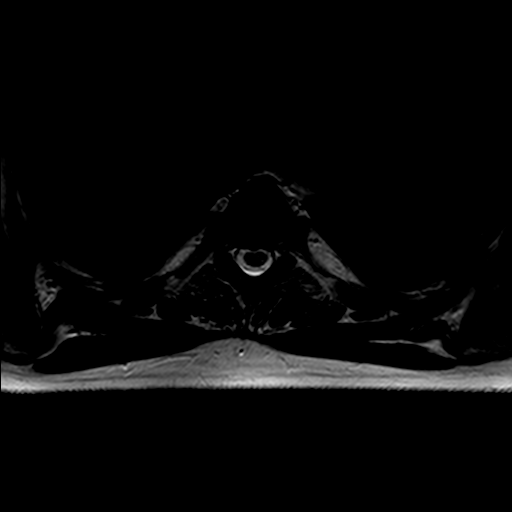
[im 4/20]
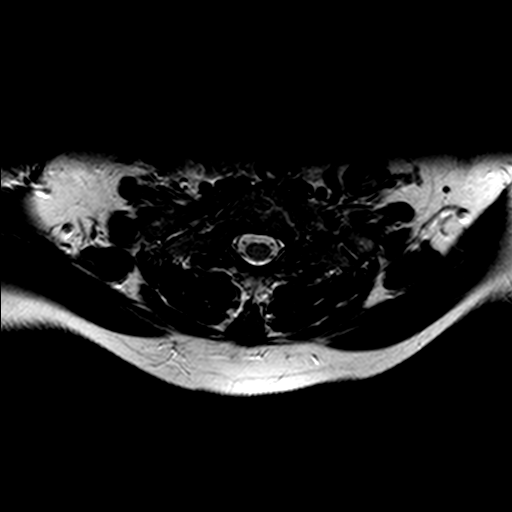
[im 6/20]
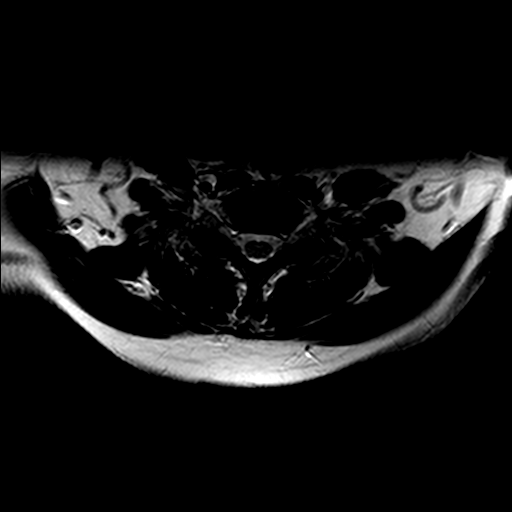
[im 8/20]
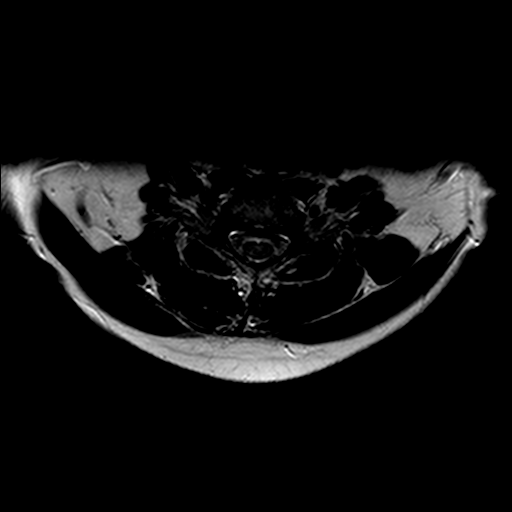
[im 10/20]
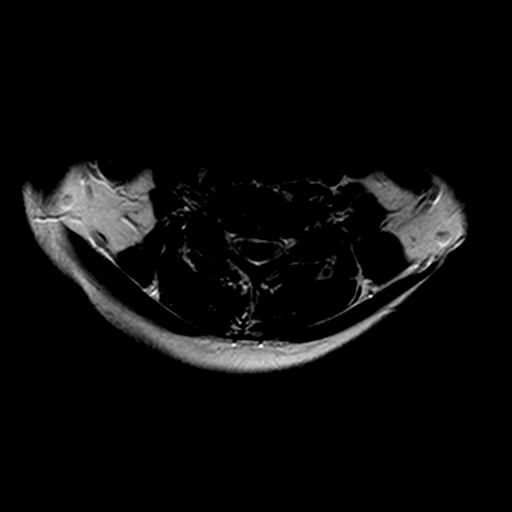
[im 18/20]
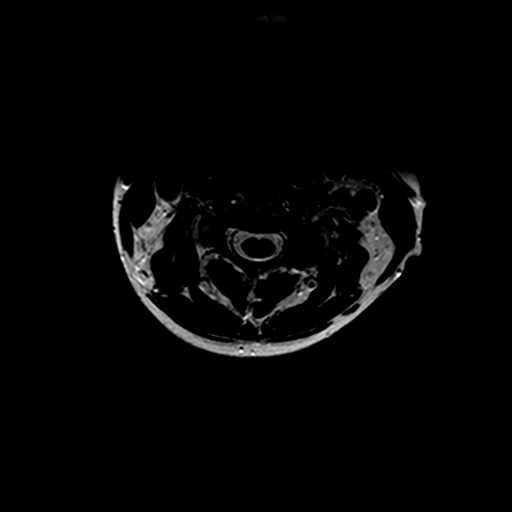

[Series 5001: T2 · sagittal · 3.0mm · 0.43mm/px · 2 of 4 slices shown (4 of 4)]
[im 1/4]
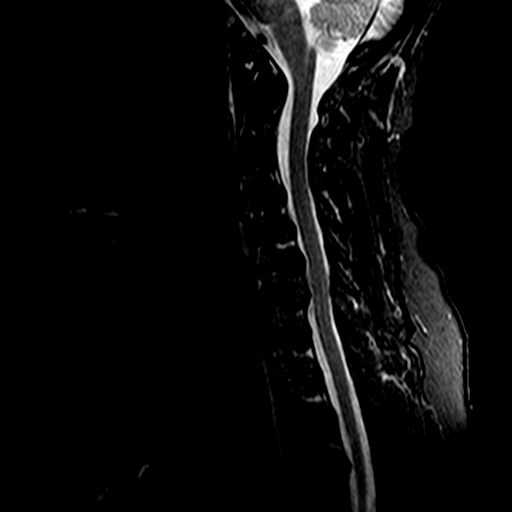
[im 4/4]
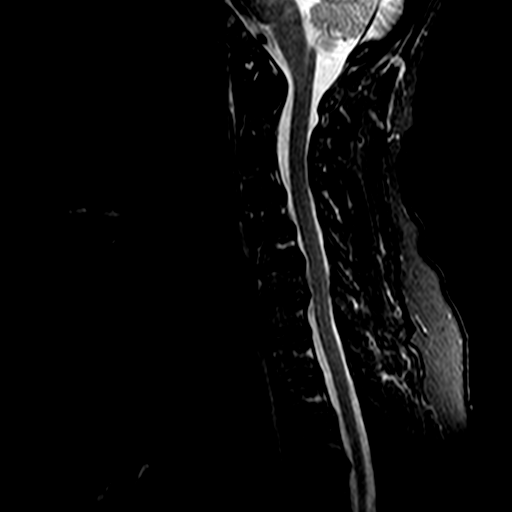

[26 of 48 positions shown; findings below may reference images not displayed]

IMPRESSION: 1.  Slight increased small central subligamentous disk herniation at T2-3 without impingement.
 2.  Chronic degenerative disk and joint disease in the mid to lower cervical spine without significant change since the prior study and without discrete nerve root impingement.  No disk herniations.   Diffuse facet joint disease.  No cord compression or myelopathy.

## 2005-11-11 IMAGING — CR DG CERVICAL SPINE 2 OR 3 VIEWS
3 series · 3 of 3 positions shown · non-contrast
Comparison: none

CLINICAL DATA: Neck pain. 
 LATERAL VIEW OF THE CERVICAL SPINE WITH FLEXION AND EXTENSION:
 The patient has accelerated degenerative disk disease C6-7 to a lesser extent C5-6.  Joint space loss is appreciated most notably at C6-7 with anterior osteophytic spurring at C5-6 and C6-7.  Alignment is stable through flexion and extension without listhesis.

[view not recorded (1 of 3)]
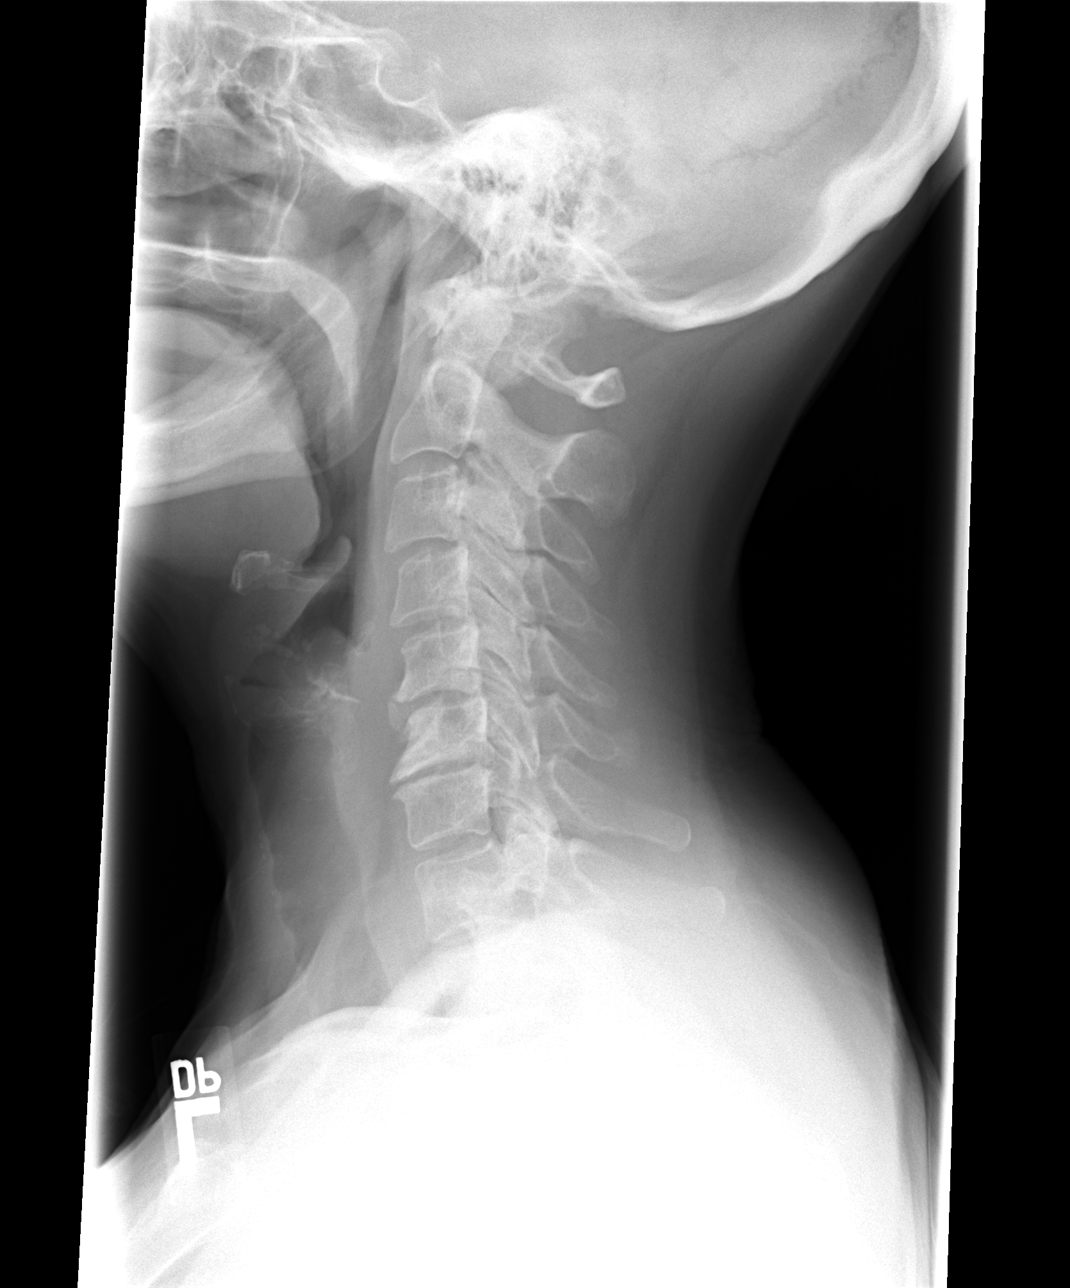

[view not recorded (2 of 3)]
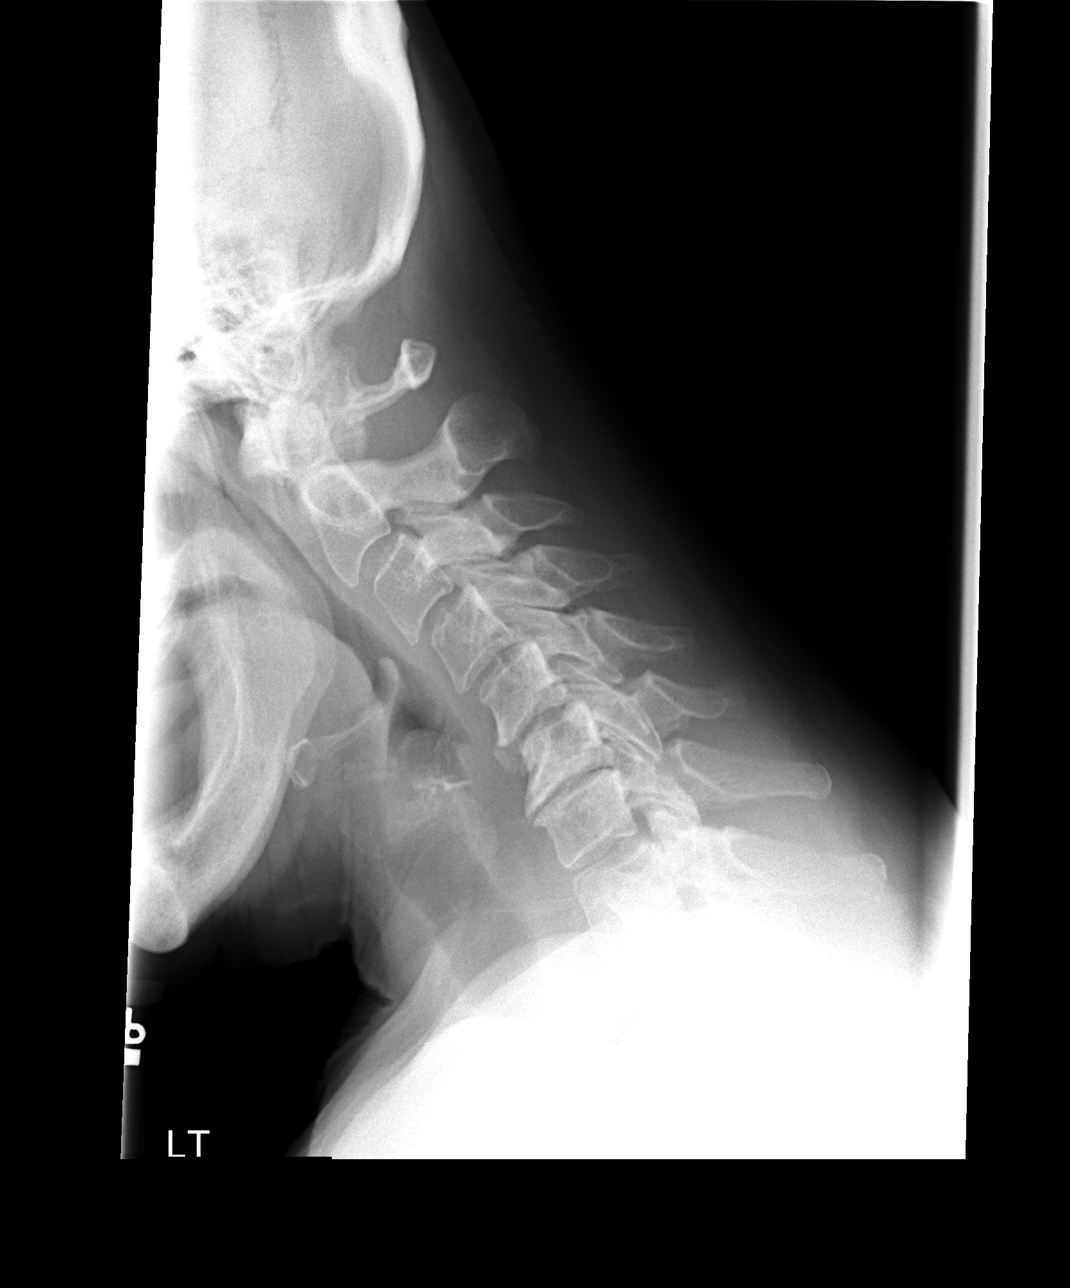

[view not recorded (3 of 3)]
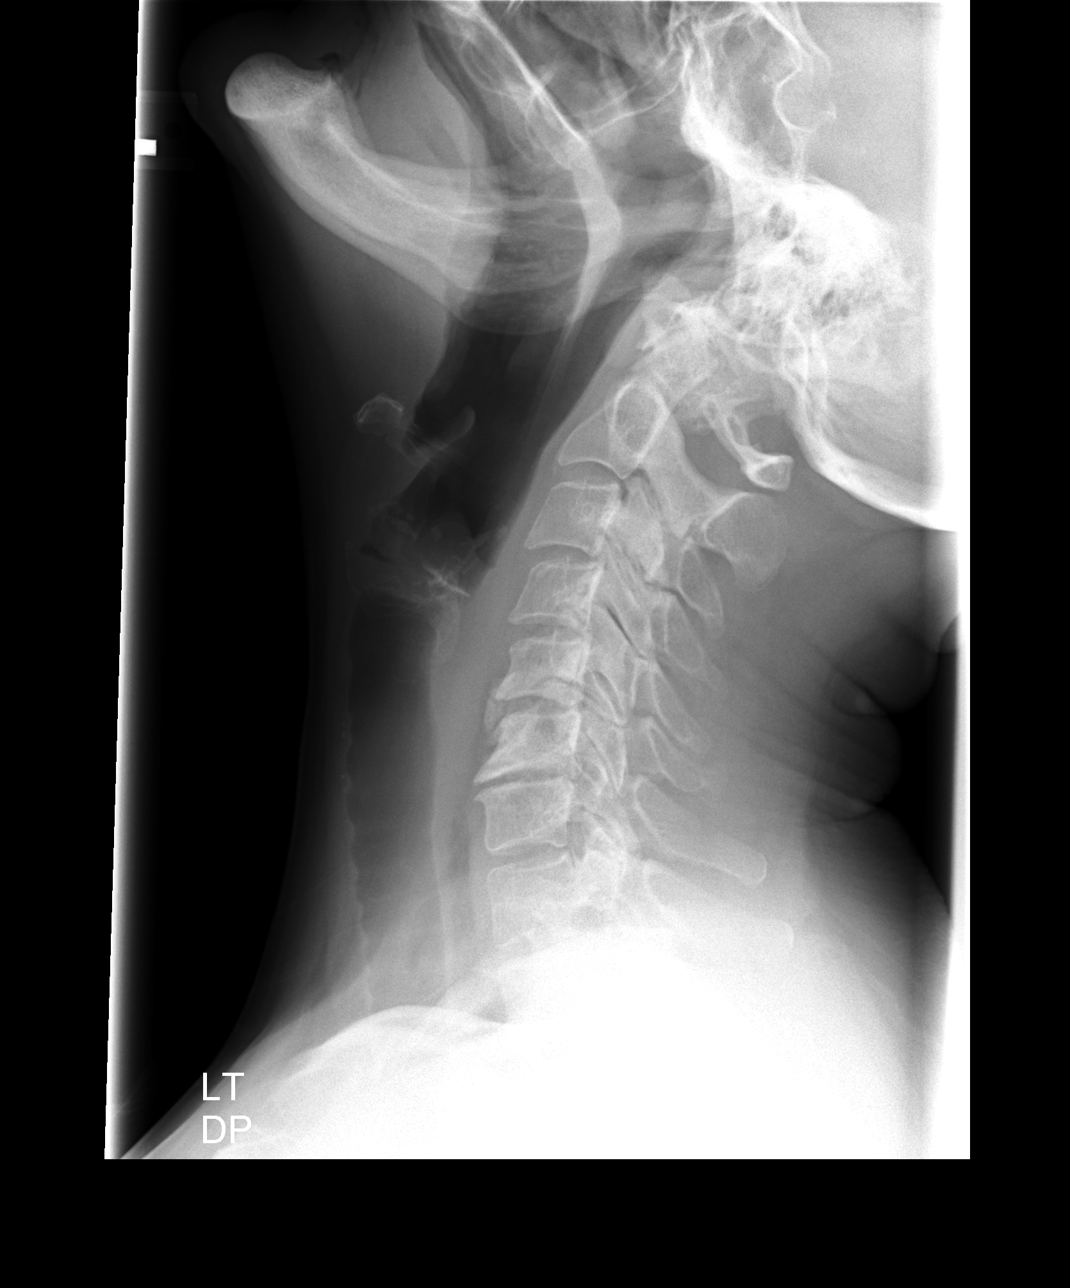

[3 of 3 positions shown; findings below may reference images not displayed]

IMPRESSION: Accelerated degenerative changes cervical spine as described.  Alignment is stable through flexion and extension.

## 2005-11-23 ENCOUNTER — Ambulatory Visit (HOSPITAL_COMMUNITY): Admission: RE | Admit: 2005-11-23 | Discharge: 2005-11-23 | Payer: Self-pay | Admitting: Internal Medicine

## 2005-11-30 ENCOUNTER — Encounter: Admission: RE | Admit: 2005-11-30 | Discharge: 2005-11-30 | Payer: Self-pay | Admitting: Internal Medicine

## 2006-02-24 ENCOUNTER — Inpatient Hospital Stay (HOSPITAL_COMMUNITY): Admission: AD | Admit: 2006-02-24 | Discharge: 2006-02-26 | Payer: Self-pay | Admitting: Cardiology

## 2006-02-24 HISTORY — PX: CARDIAC CATHETERIZATION: SHX172

## 2006-02-25 HISTORY — PX: CARDIAC CATHETERIZATION: SHX172

## 2006-03-10 ENCOUNTER — Encounter (HOSPITAL_COMMUNITY): Admission: RE | Admit: 2006-03-10 | Discharge: 2006-06-08 | Payer: Self-pay | Admitting: *Deleted

## 2006-04-25 ENCOUNTER — Inpatient Hospital Stay (HOSPITAL_COMMUNITY): Admission: EM | Admit: 2006-04-25 | Discharge: 2006-04-26 | Payer: Self-pay | Admitting: Emergency Medicine

## 2006-04-26 HISTORY — PX: CARDIAC CATHETERIZATION: SHX172

## 2006-05-05 ENCOUNTER — Ambulatory Visit (HOSPITAL_COMMUNITY): Admission: RE | Admit: 2006-05-05 | Discharge: 2006-05-05 | Payer: Self-pay | Admitting: Family Medicine

## 2006-06-09 ENCOUNTER — Encounter (HOSPITAL_COMMUNITY): Admission: RE | Admit: 2006-06-09 | Discharge: 2006-08-12 | Payer: Self-pay | Admitting: *Deleted

## 2006-08-23 ENCOUNTER — Ambulatory Visit (HOSPITAL_COMMUNITY): Admission: RE | Admit: 2006-08-23 | Discharge: 2006-08-24 | Payer: Self-pay | Admitting: Ophthalmology

## 2006-12-14 ENCOUNTER — Encounter: Admission: RE | Admit: 2006-12-14 | Discharge: 2006-12-14 | Payer: Self-pay | Admitting: Internal Medicine

## 2006-12-31 IMAGING — CR DG CHEST 2V
2 series · 2 of 2 positions shown · non-contrast
Comparison: None.

CLINICAL DATA: Pre op evaluation for melanoma. 
 CHEST ? 2 VIEW:

[w chest pa]
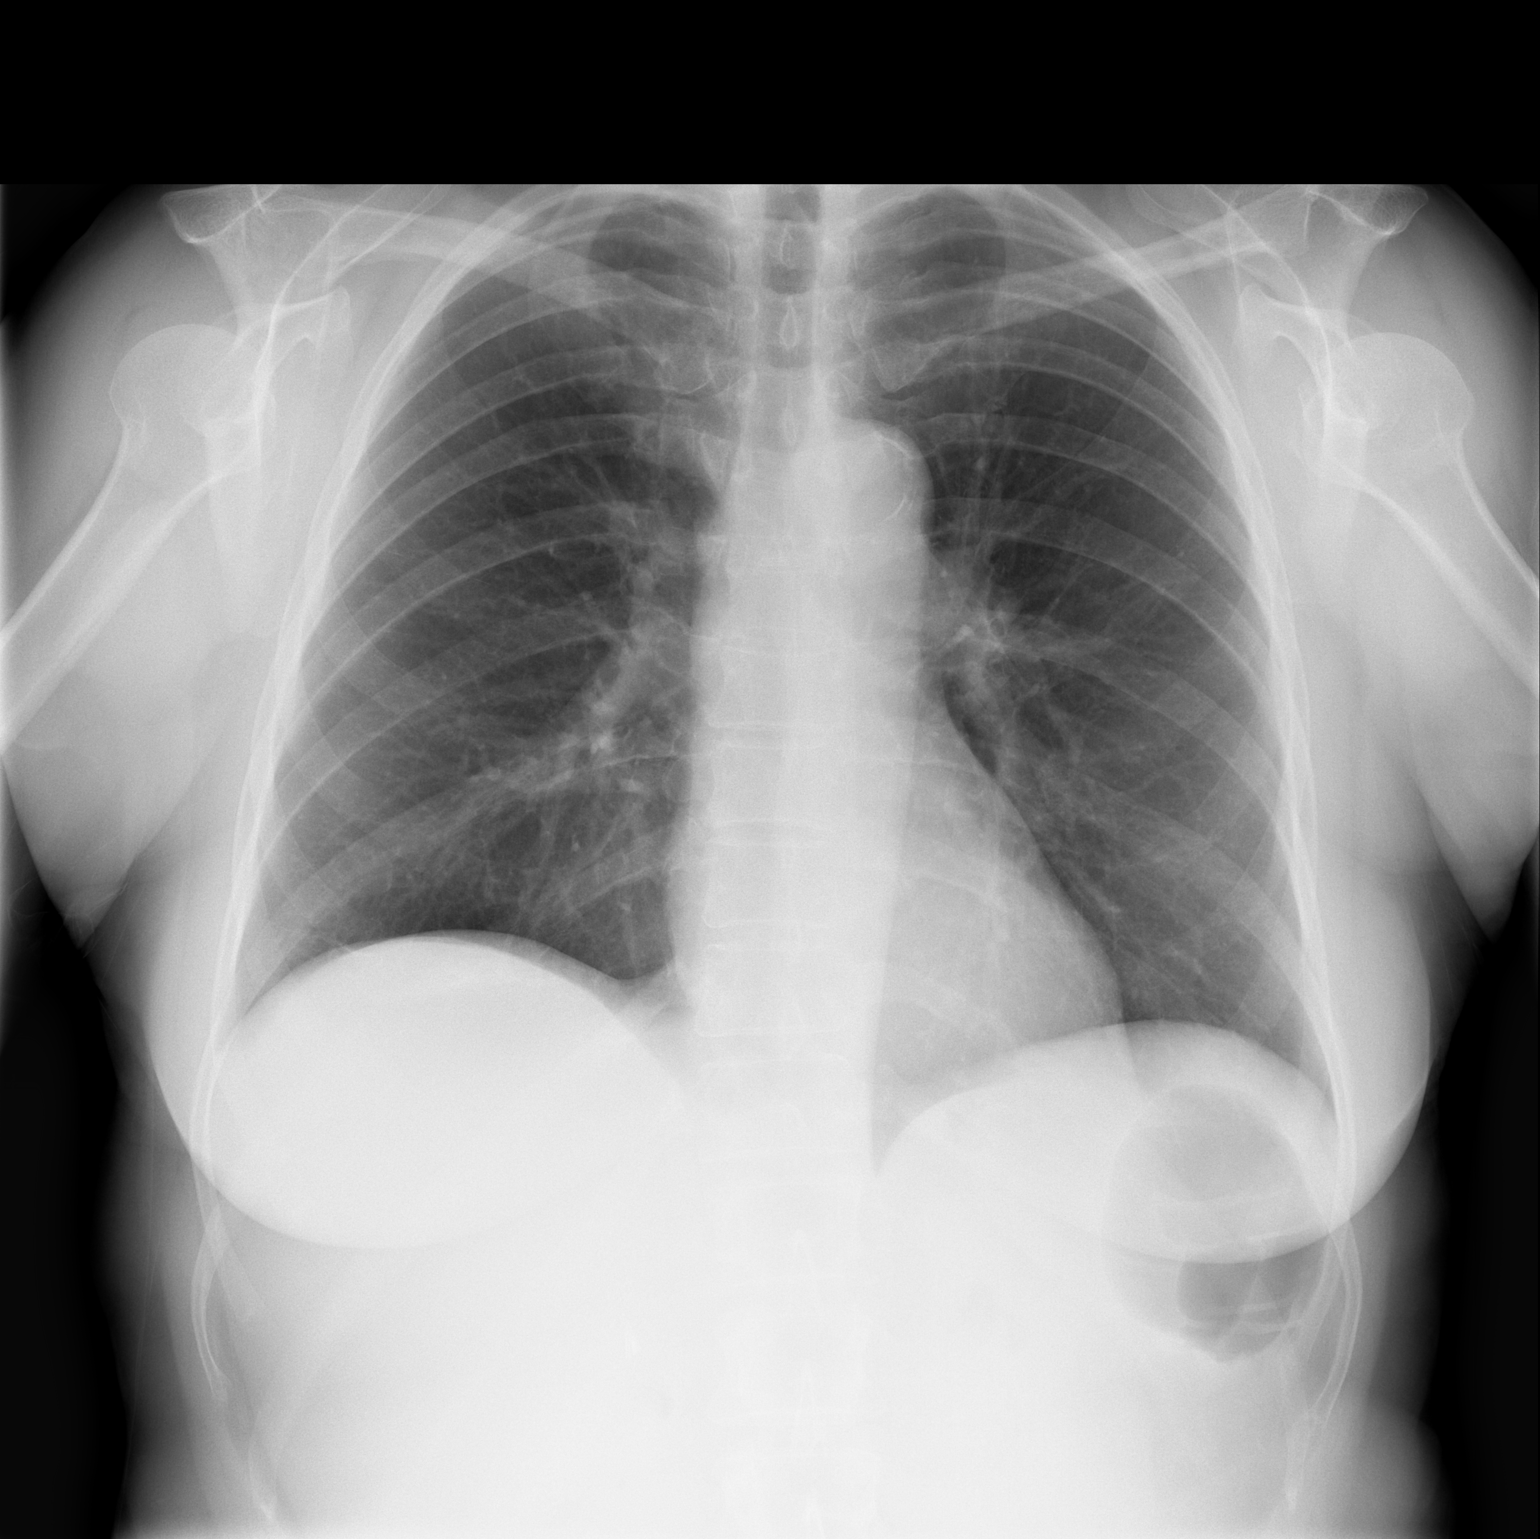

[w chest lat]
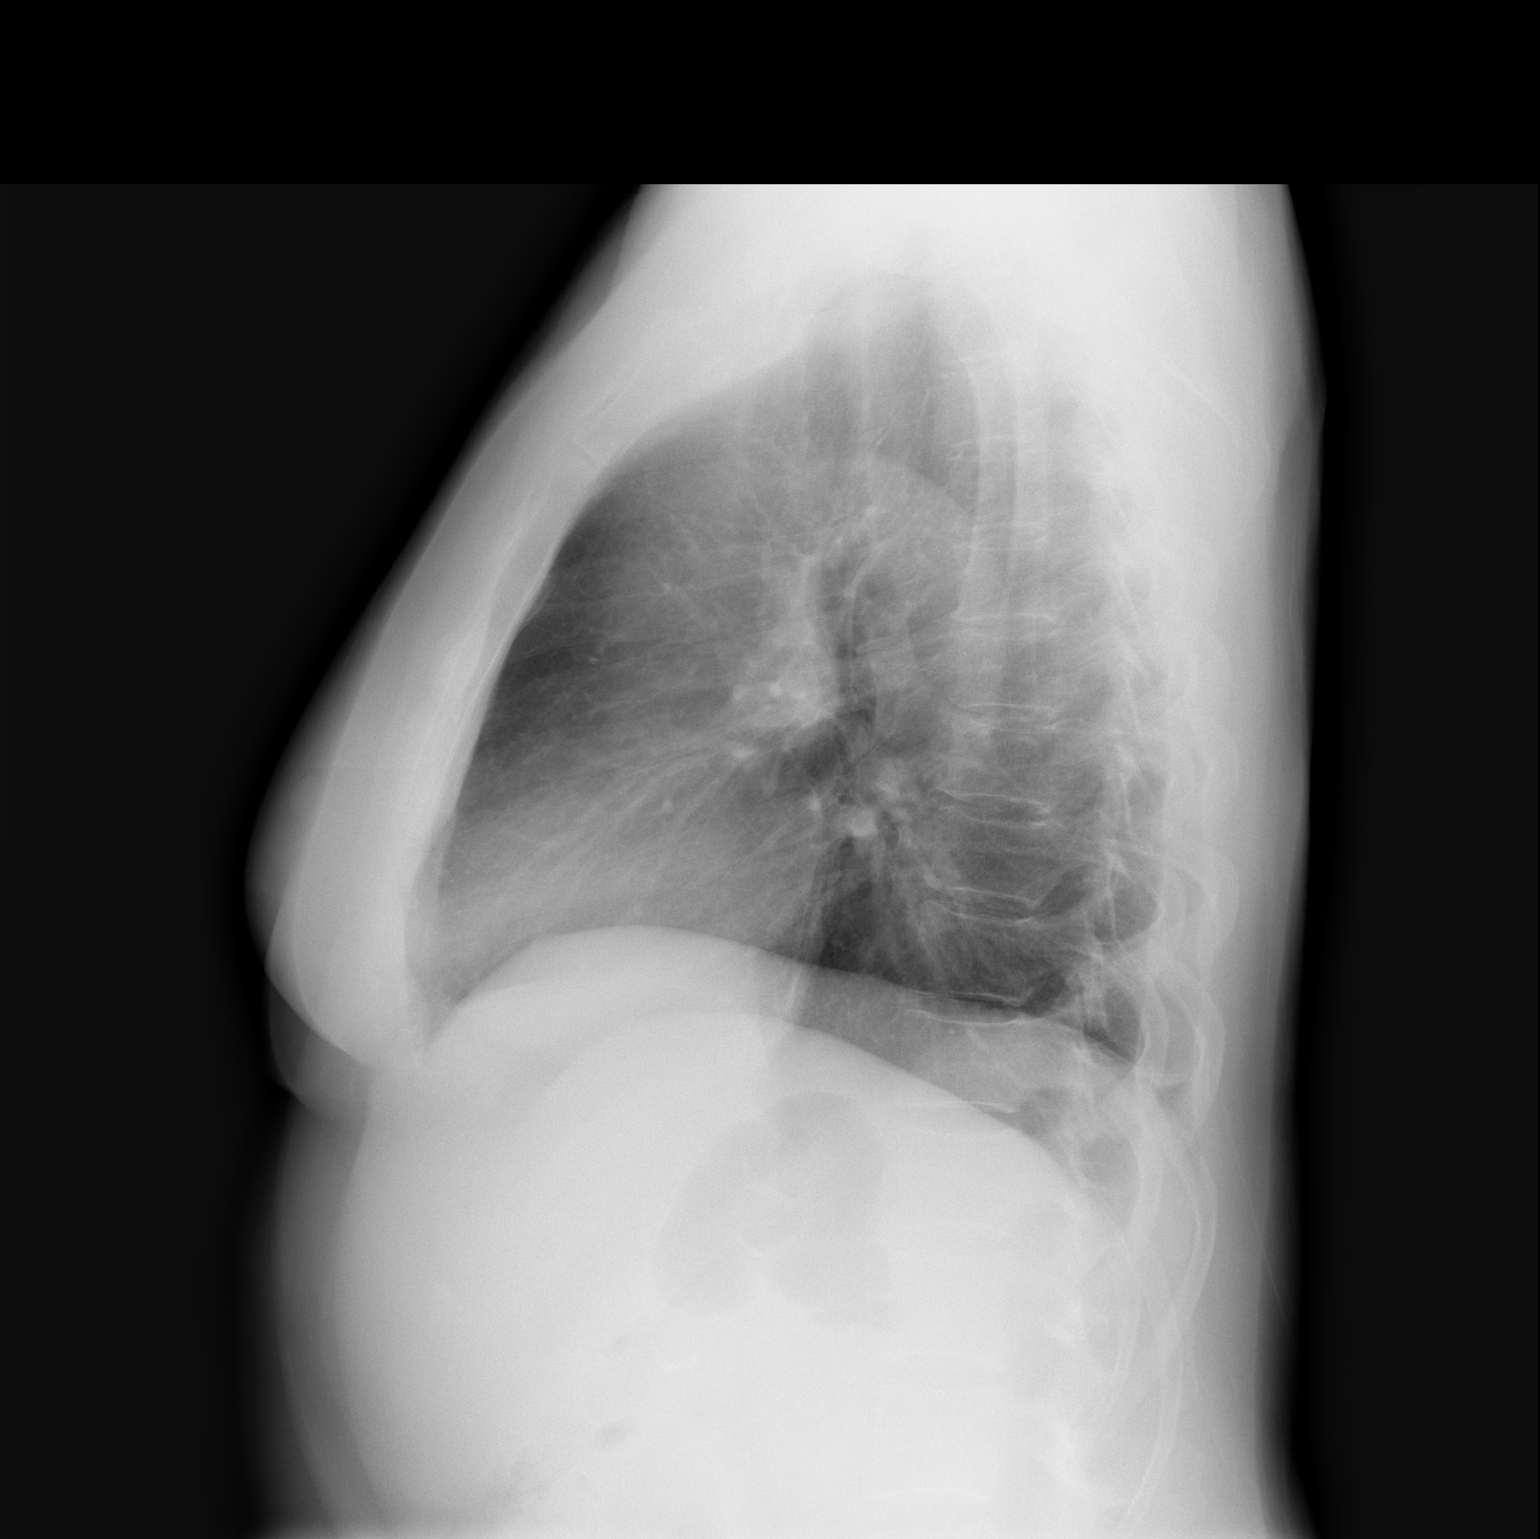

[2 of 2 positions shown; findings below may reference images not displayed]

FINDINGS: There is trace subsegmental atelectasis or linear scar at the right base.  The lungs are otherwise clear.  The cardiopericardial silhouette is within normal limits for size.  Bony structures of the imaged thorax are intact.
IMPRESSION: Trace atelectasis or scar at the right base.  Otherwise no acute findings.

## 2007-05-26 IMAGING — MG MM SCREEN MAMMOGRAM BILATERAL
5 series · 5 of 5 positions shown · non-contrast
Comparison: none

DG SCREEN MAMMOGRAM BILATERAL
Bilateral CC and MLO view(s) were taken.
Prior study comparison: September 16, 2003, bilateral screening mammogram.

SCREENING MAMMOGRAM:
The breast tissue is heterogeneously dense.  There is no dominant mass, architectural distortion or
calcification to suggest malignancy.

[R CC]
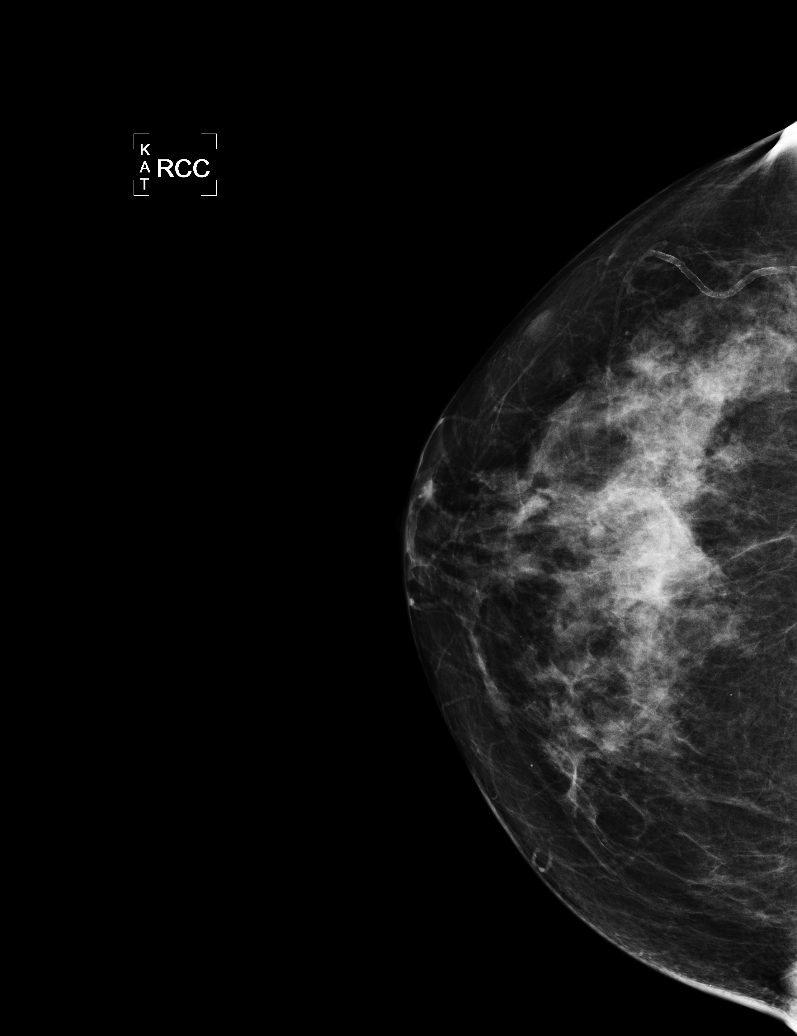

[L CC]
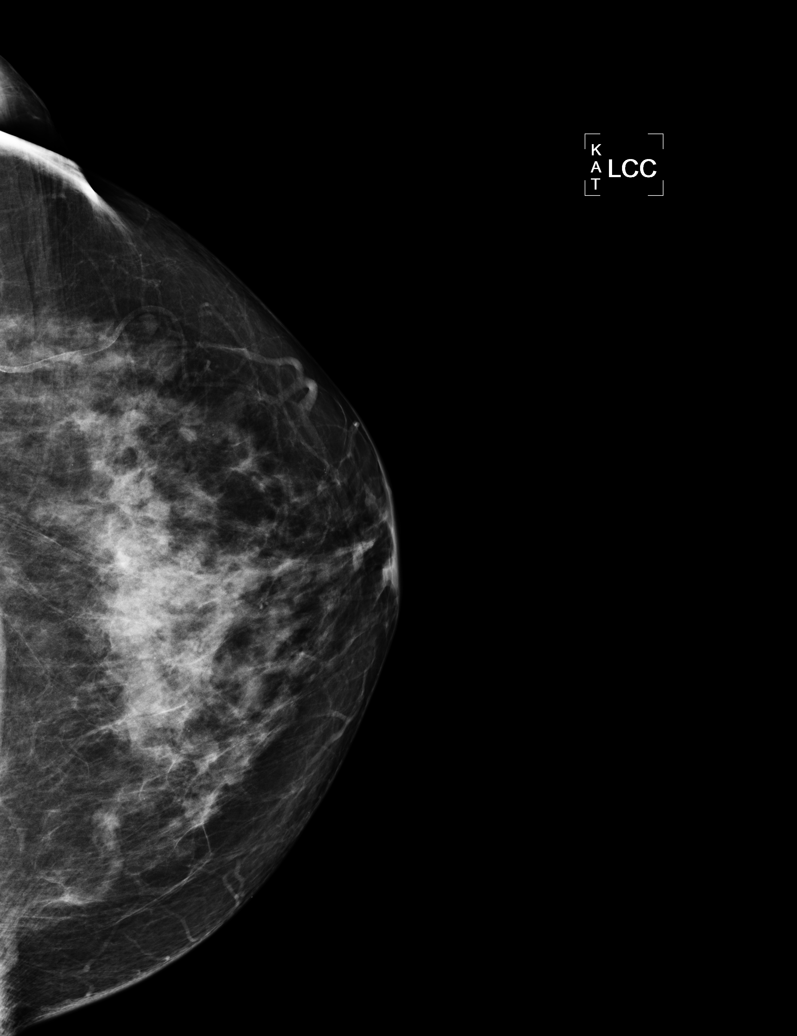

[L MLO (1 of 2)]
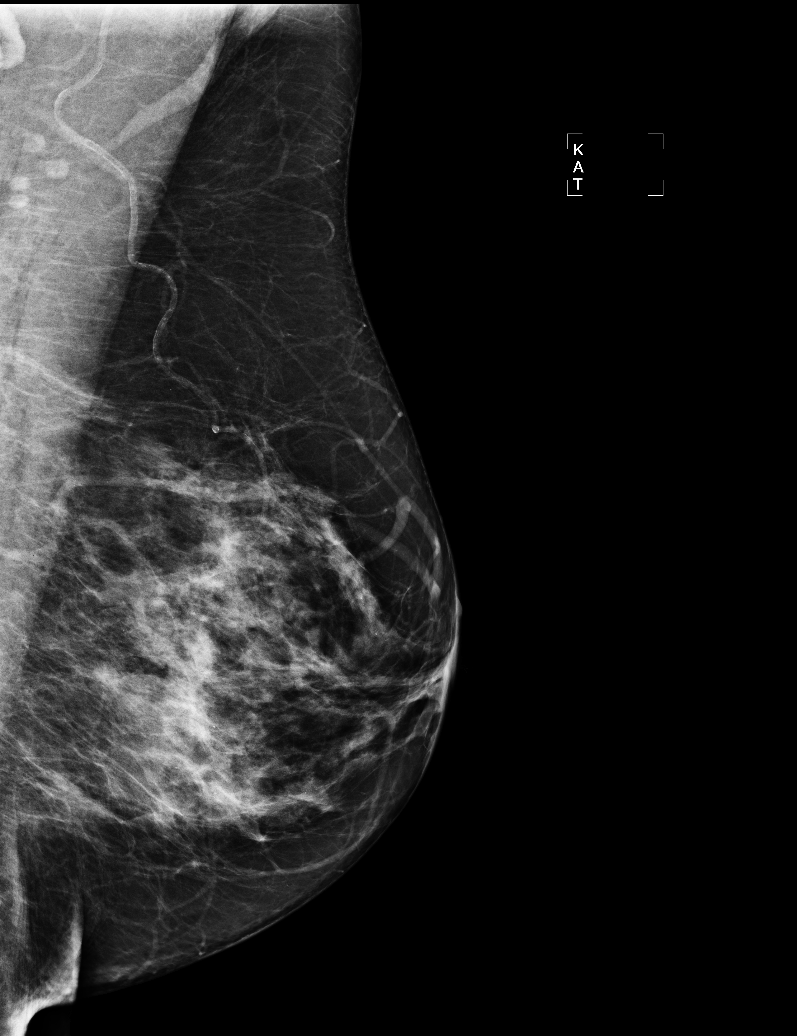

[R MLO]
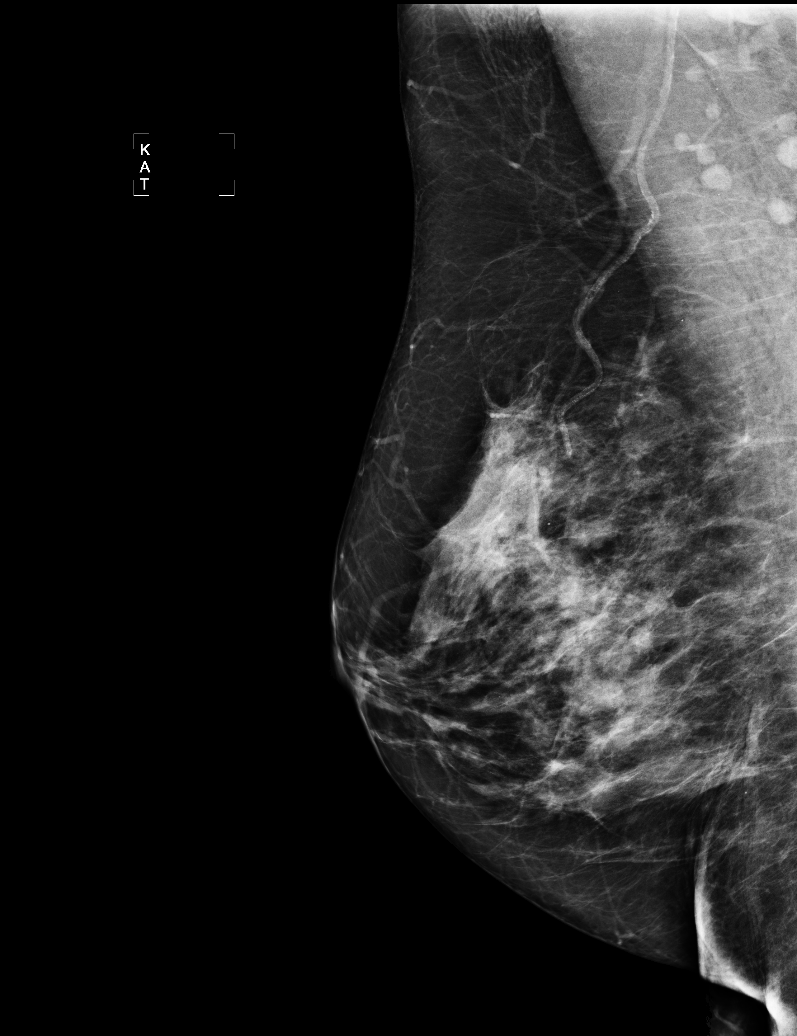

[L MLO (2 of 2)]
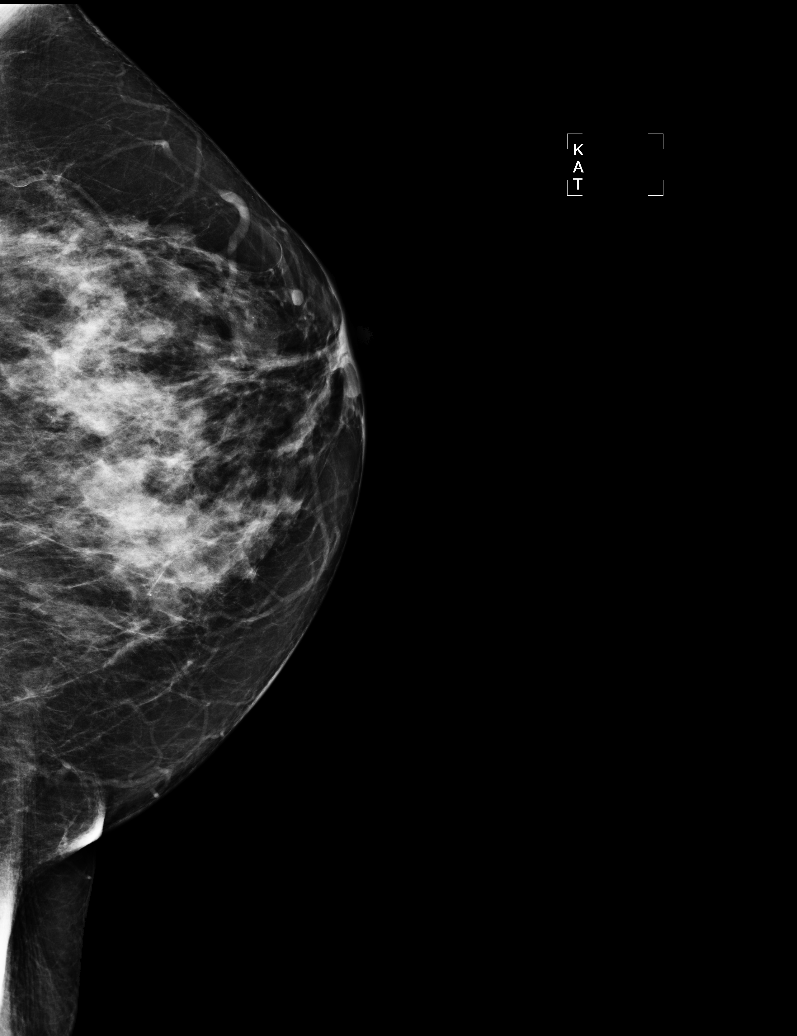

[5 of 5 positions shown; findings below may reference images not displayed]

IMPRESSION: No mammographic evidence of malignancy.  Suggest yearly screening mammography.

ASSESSMENT: Negative - BI-RADS 1

Screening mammogram in 1 year.
ANALYZED BY COMPUTER AIDED DETECTION. , THIS PROCEDURE WAS A DIGITAL MAMMOGRAM.

## 2007-06-23 ENCOUNTER — Encounter: Admission: RE | Admit: 2007-06-23 | Discharge: 2007-06-23 | Payer: Self-pay | Admitting: *Deleted

## 2007-07-05 ENCOUNTER — Encounter: Admission: RE | Admit: 2007-07-05 | Discharge: 2007-07-05 | Payer: Self-pay | Admitting: Internal Medicine

## 2007-08-20 IMAGING — CR DG CHEST 2V
2 series · 2 of 2 positions shown · non-contrast
Comparison: 11/23/05.

CLINICAL DATA: Chest pain.   Coronary artery disease.
 CHEST- 2 VIEWS - 02/24/06 AT 0423 HOURS:

[w chest pa]
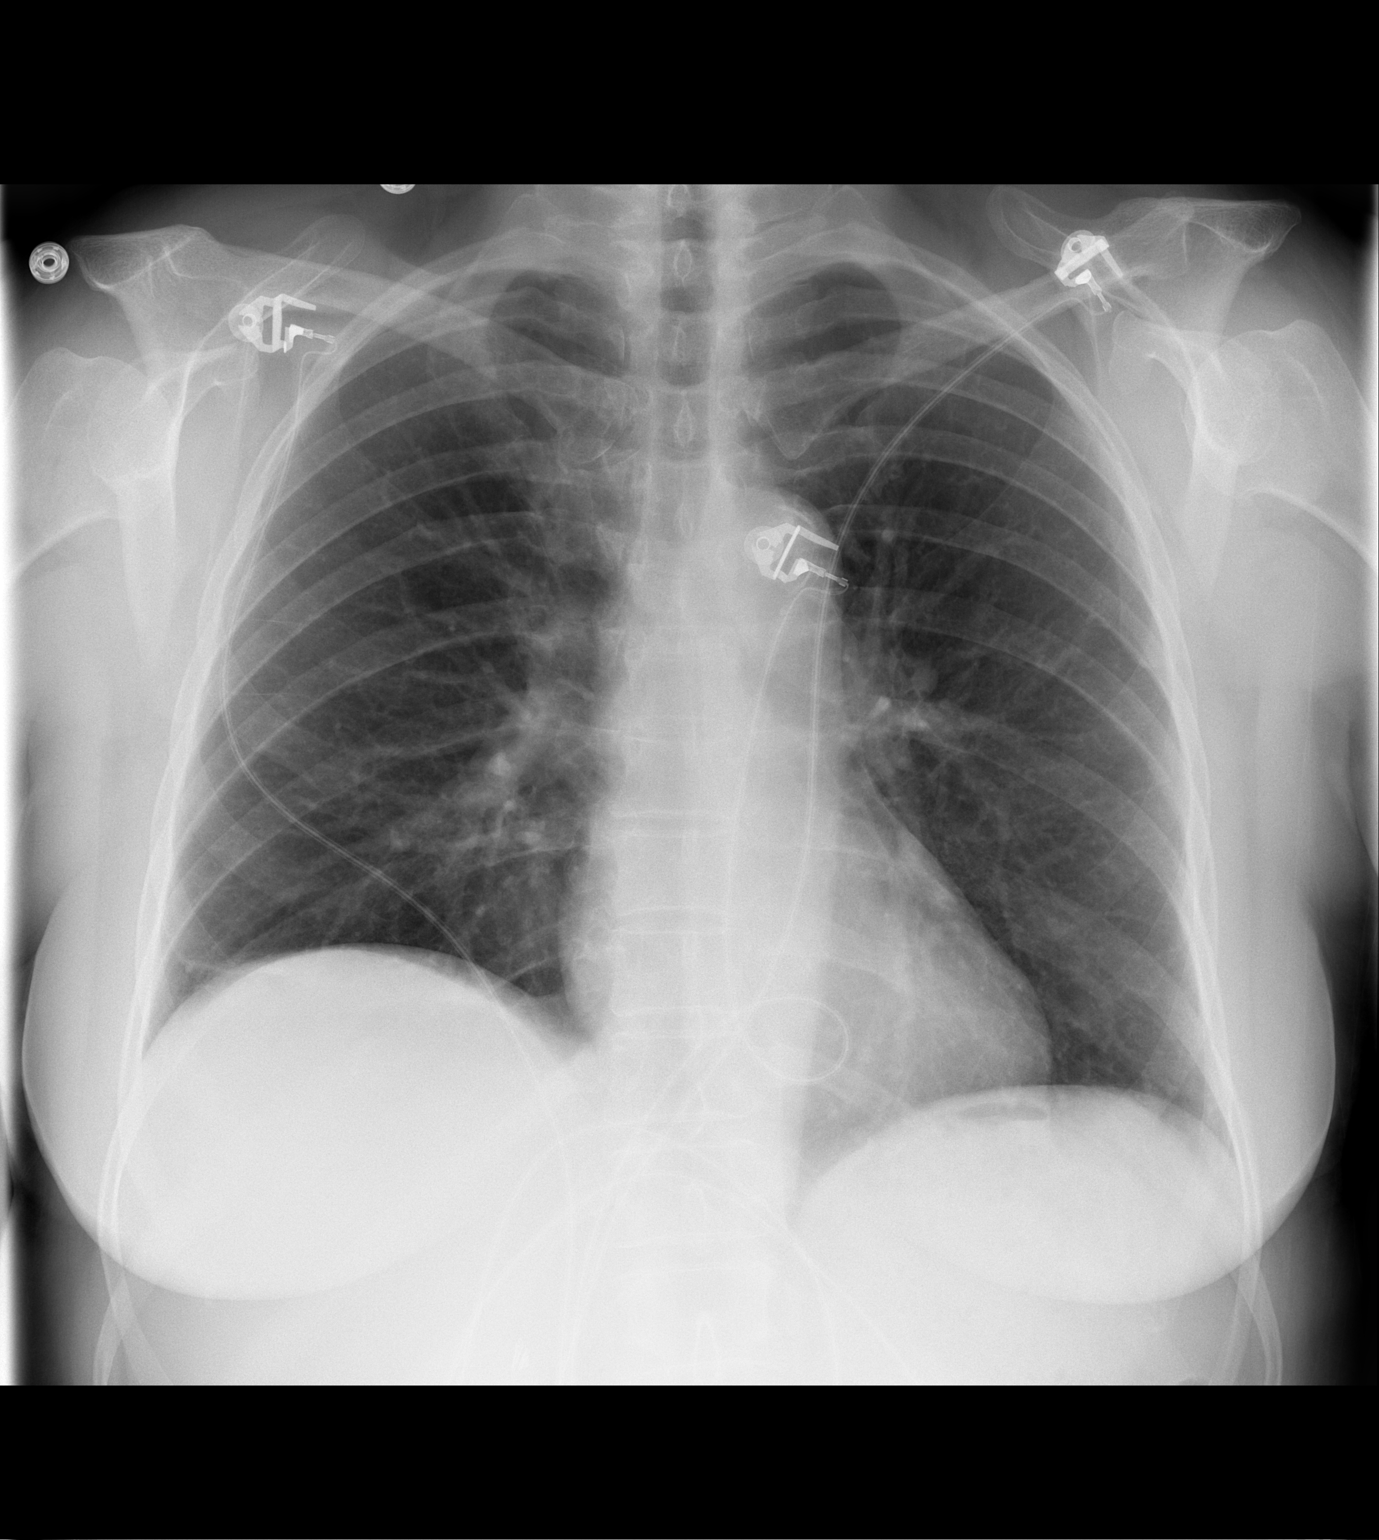

[w chest lat]
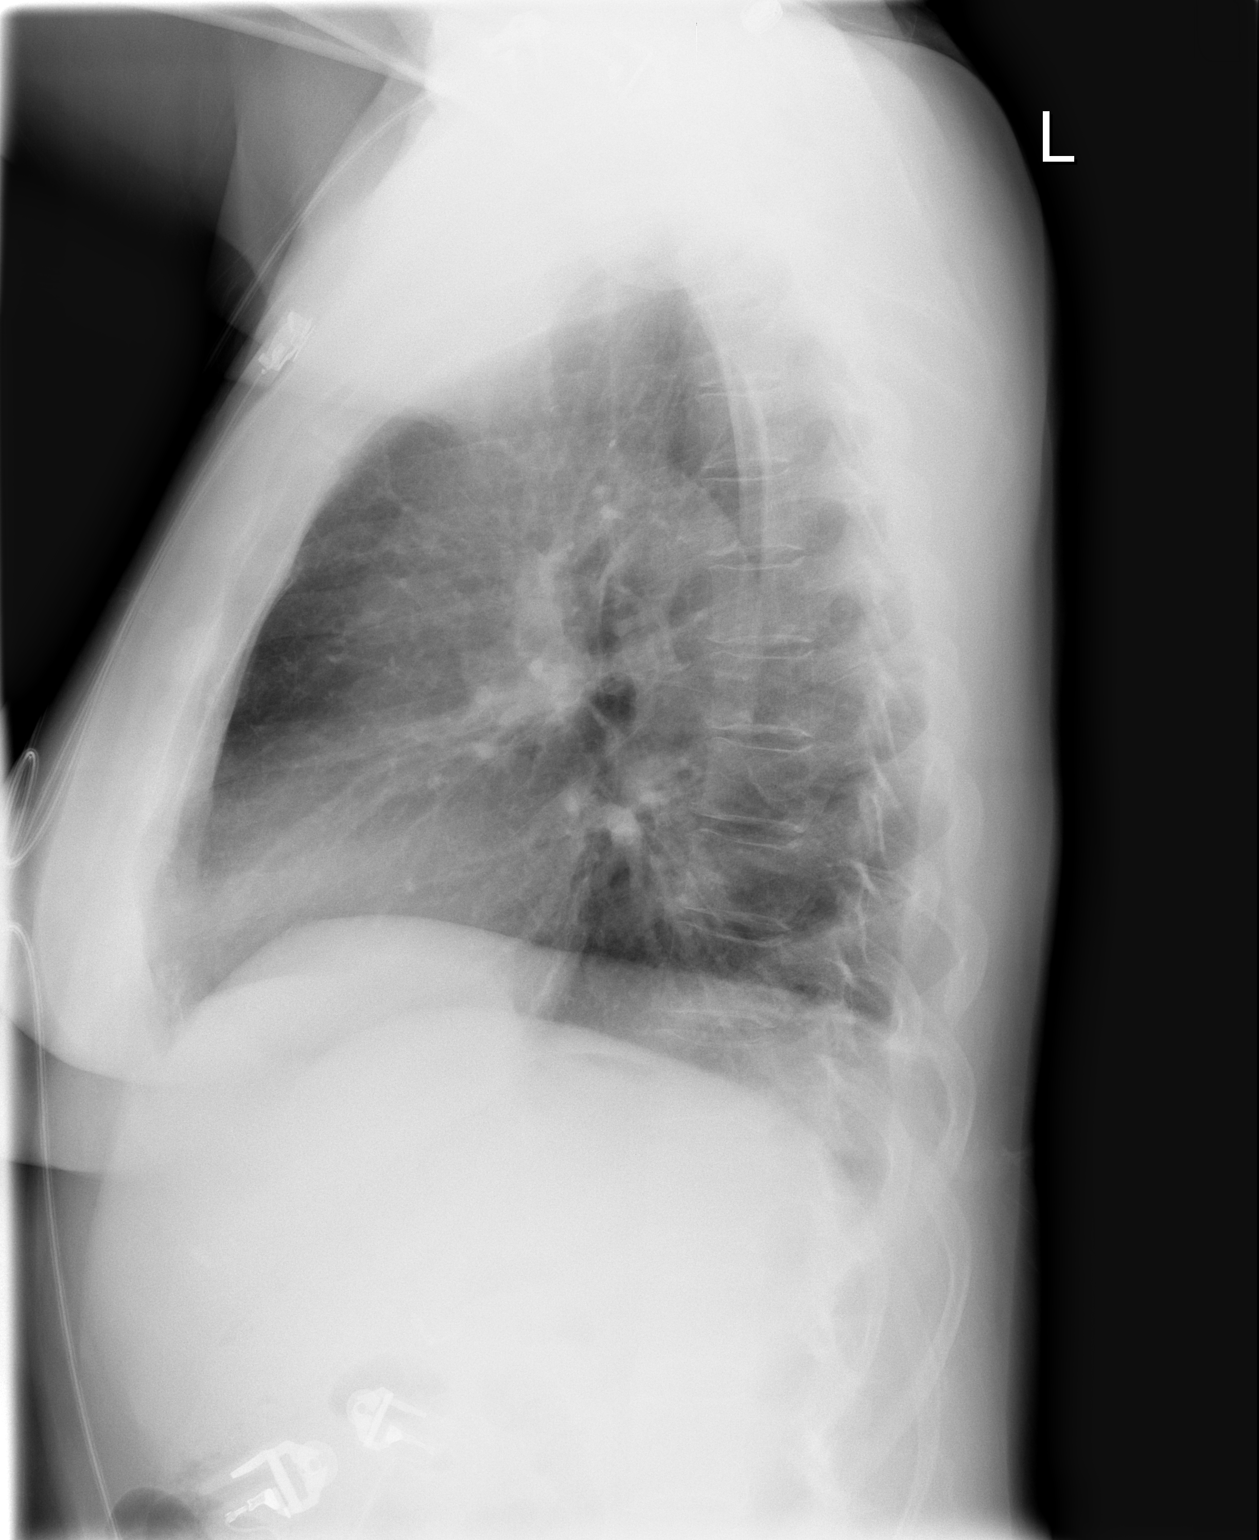

[2 of 2 positions shown; findings below may reference images not displayed]

FINDINGS: The heart is normal in size.  Bronchitic changes are stable.  Linear scarring at the right base is noted.  The lungs are otherwise clear.   No pneumothoraces or effusions are seen.
IMPRESSION: Bronchitic changes.  Right base atelectasis.

## 2007-09-13 ENCOUNTER — Ambulatory Visit (HOSPITAL_COMMUNITY): Admission: RE | Admit: 2007-09-13 | Discharge: 2007-09-13 | Payer: Self-pay | Admitting: *Deleted

## 2007-10-19 IMAGING — CR DG CHEST 1V PORT
1 series · 1 of 1 positions shown · non-contrast
Comparison: 02/24/06.

CLINICAL DATA: Chest pain. 
 PORTABLE CHEST:

[view not recorded]
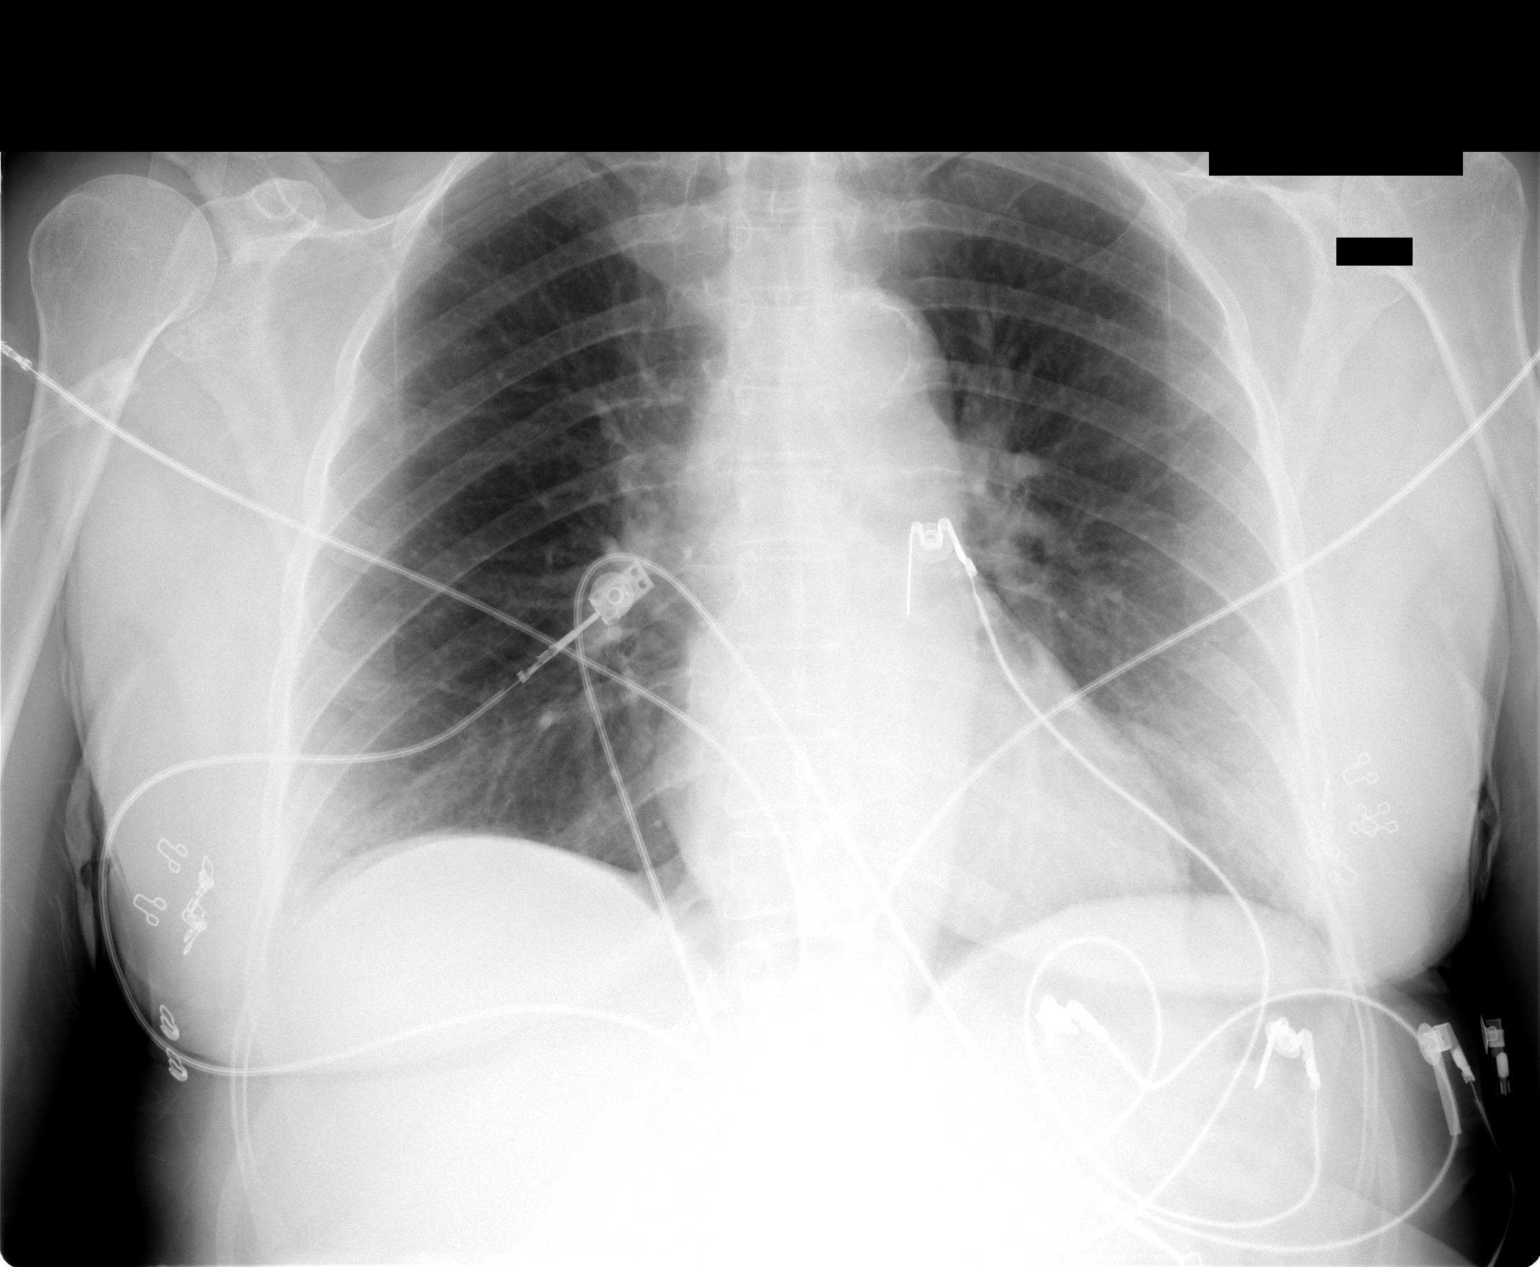

[1 of 1 positions shown; findings below may reference images not displayed]

FINDINGS: Cardiomediastinal silhouette is unremarkable.  The lungs are clear.  No evidence of pleural effusions or pneumothorax.  Visualized upper abdomen and bony thorax are within normal limits.
IMPRESSION: No evidence of acute cardiopulmonary disease.

## 2007-11-17 ENCOUNTER — Ambulatory Visit (HOSPITAL_COMMUNITY): Admission: RE | Admit: 2007-11-17 | Discharge: 2007-11-17 | Payer: Self-pay | Admitting: Internal Medicine

## 2007-11-29 ENCOUNTER — Inpatient Hospital Stay (HOSPITAL_COMMUNITY): Admission: RE | Admit: 2007-11-29 | Discharge: 2007-12-02 | Payer: Self-pay | Admitting: Orthopedic Surgery

## 2008-02-15 ENCOUNTER — Encounter: Admission: RE | Admit: 2008-02-15 | Discharge: 2008-02-15 | Payer: Self-pay | Admitting: Internal Medicine

## 2008-03-29 HISTORY — PX: WRIST ARTHROPLASTY: SHX1088

## 2008-09-26 ENCOUNTER — Encounter: Payer: Self-pay | Admitting: Cardiovascular Disease

## 2008-09-26 ENCOUNTER — Encounter (INDEPENDENT_AMBULATORY_CARE_PROVIDER_SITE_OTHER): Payer: Self-pay | Admitting: Internal Medicine

## 2008-09-26 ENCOUNTER — Inpatient Hospital Stay (HOSPITAL_COMMUNITY): Admission: EM | Admit: 2008-09-26 | Discharge: 2008-09-27 | Payer: Self-pay | Admitting: Emergency Medicine

## 2008-09-26 ENCOUNTER — Ambulatory Visit: Payer: Self-pay | Admitting: Surgery

## 2008-11-05 ENCOUNTER — Ambulatory Visit (HOSPITAL_COMMUNITY): Admission: RE | Admit: 2008-11-05 | Discharge: 2008-11-05 | Payer: Self-pay | Admitting: Family Medicine

## 2008-12-16 IMAGING — RF DG ESOPHAGUS
14 series · 20 of 22 positions shown · non-contrast
Comparison: None

CLINICAL DATA: Dysphagia

BARIUM SWALLOW / ESOPHAGRAM

[Series 1: run · 1 of 1 slices shown (1 of 14)]
[im 1/1]
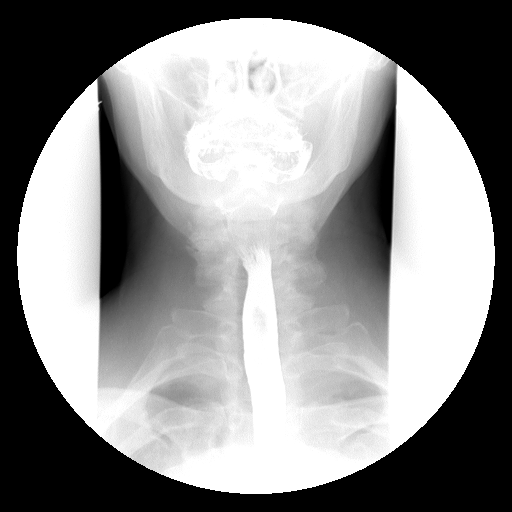

[Series 2: run · 4 of 5 slices shown (2 of 14)]
[im 1/5]
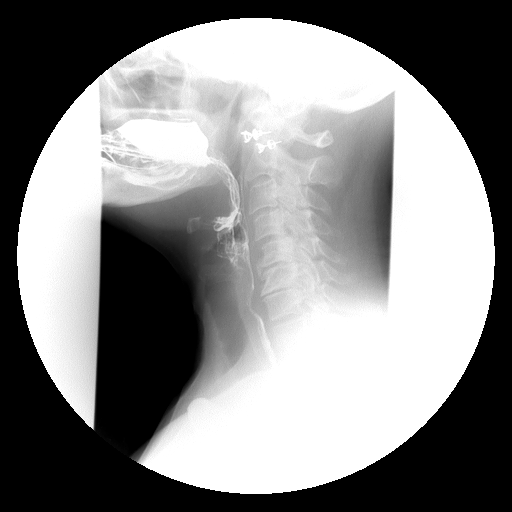
[im 2/5]
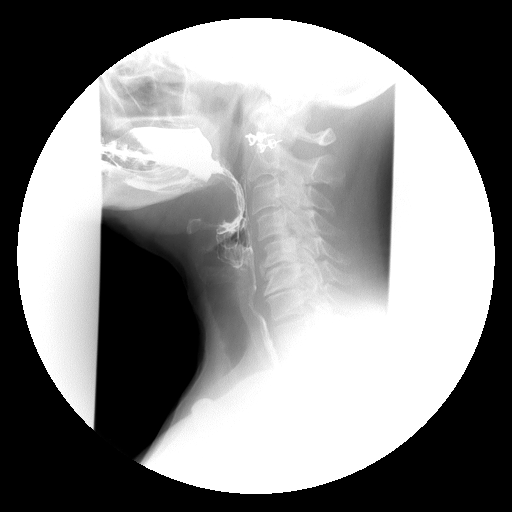
[im 3/5]
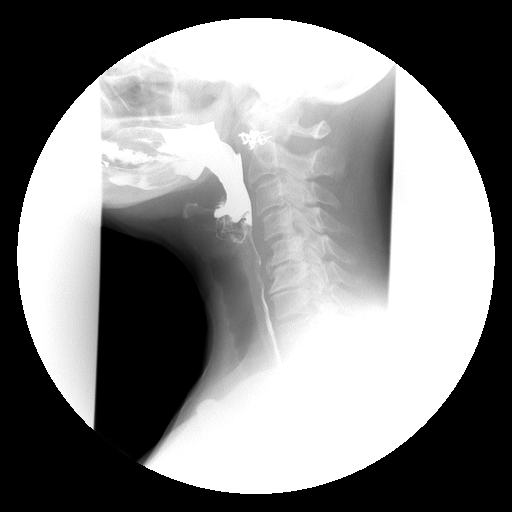
[im 4/5]
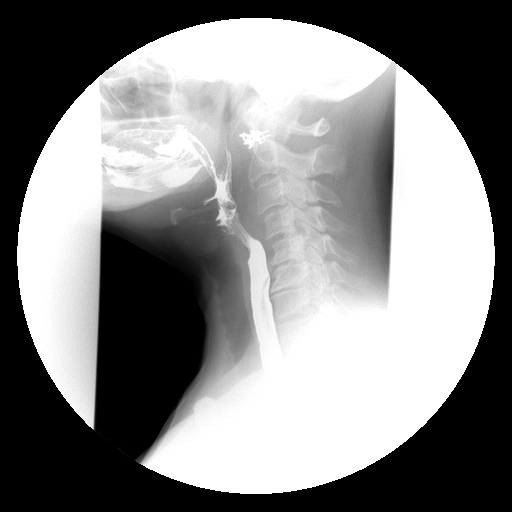

[Series 3: run · 1 of 1 slices shown (3 of 14)]
[im 1/1]
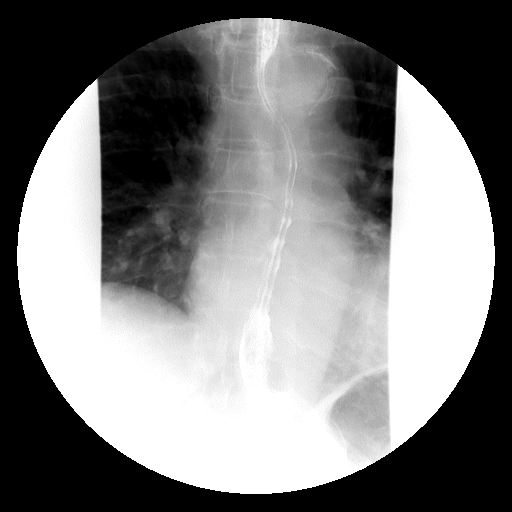

[Series 4: run · 2 of 2 slices shown (4 of 14)]
[im 1/2]
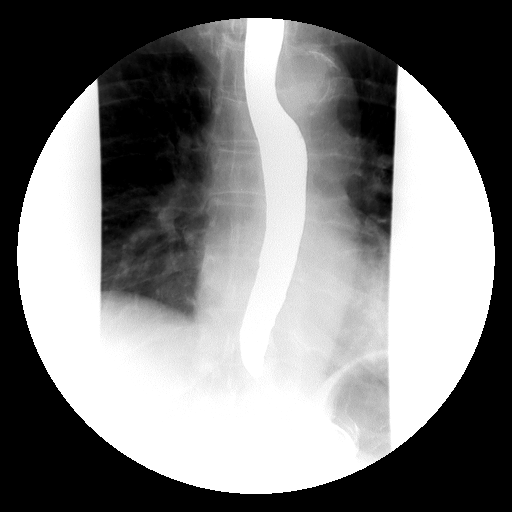
[im 2/2]
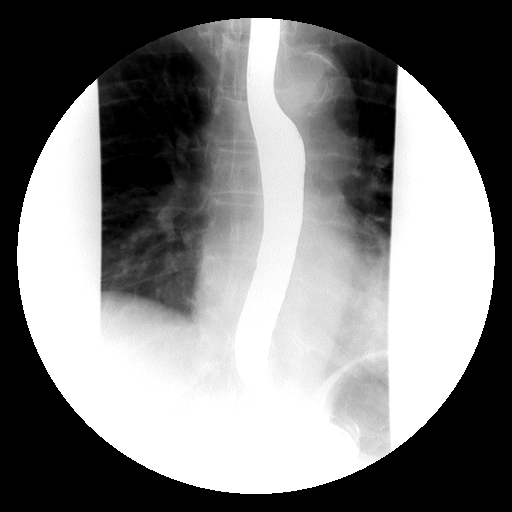

[Series 5: run · 2 of 2 slices shown (5 of 14)]
[im 1/2]
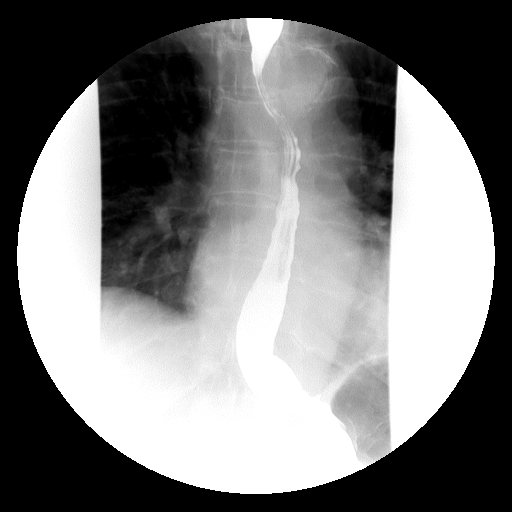
[im 2/2]
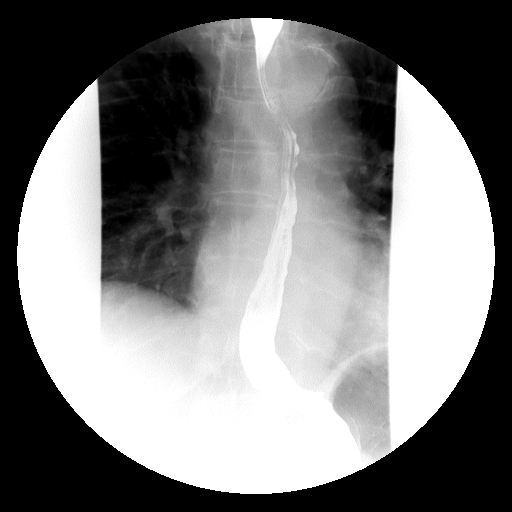

[Series 6: run · 1 of 1 slices shown (6 of 14)]
[im 1/1]
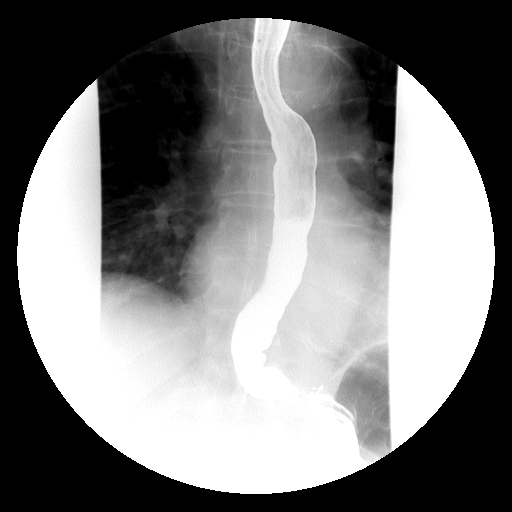

[Series 7: run · 1 of 1 slices shown (7 of 14)]
[im 1/1]
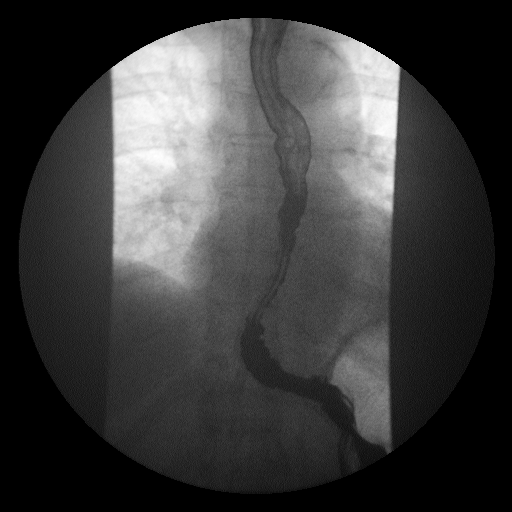

[Series 8: run · 1 of 1 slices shown (8 of 14)]
[im 1/1]
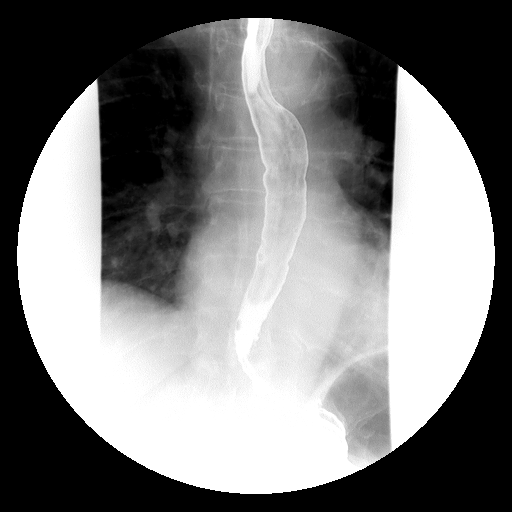

[Series 9: run · 1 of 1 slices shown (9 of 14)]
[im 1/1]
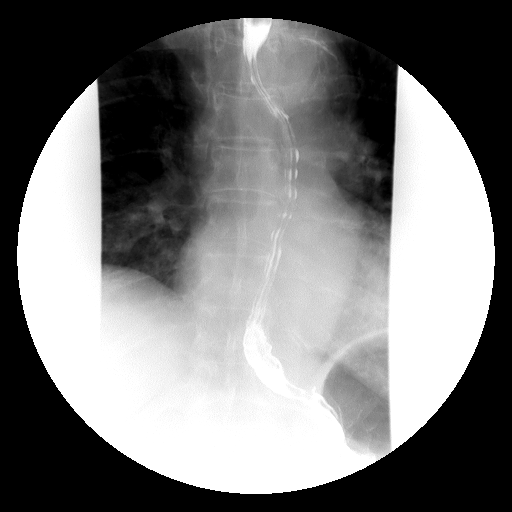

[Series 10: run · 1 of 2 slices shown (10 of 14)]
[im 1/2]
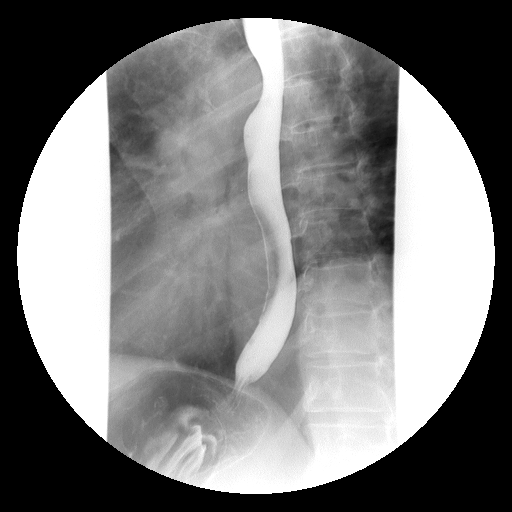

[Series 11: run · 2 of 2 slices shown (11 of 14)]
[im 1/2]
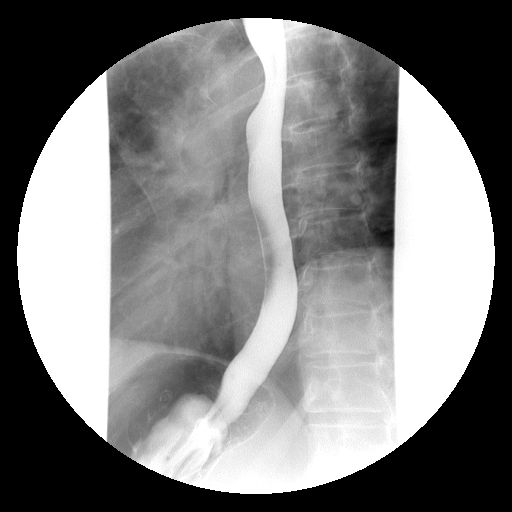
[im 2/2]
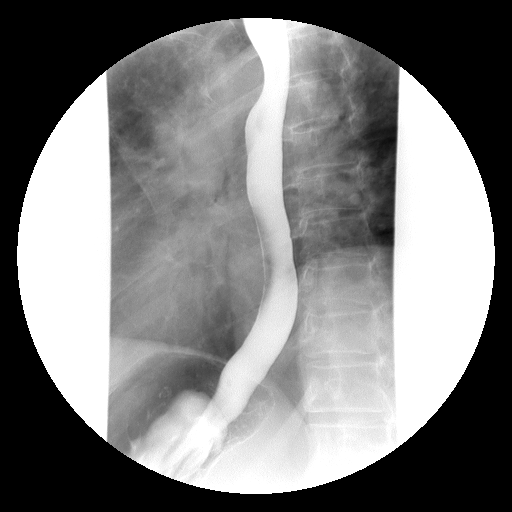

[Series 12: run · 1 of 1 slices shown (12 of 14)]
[im 1/1]
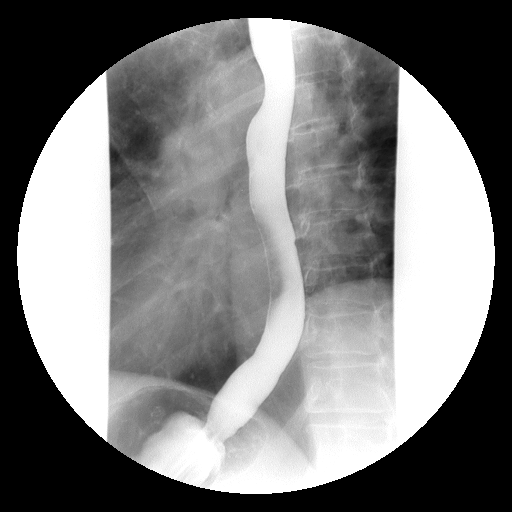

[Series 13: run · 1 of 1 slices shown (13 of 14)]
[im 1/1]
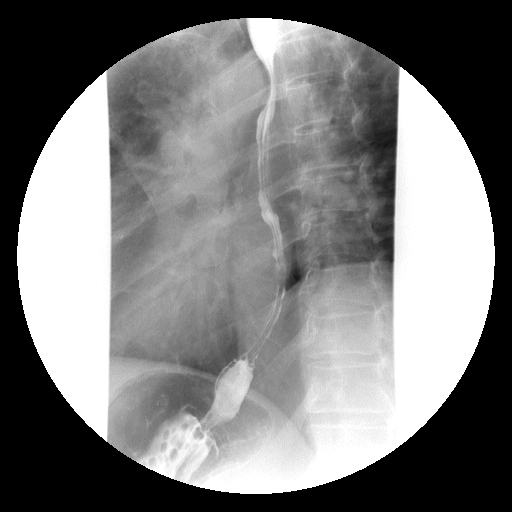

[Series 14: run · 1 of 1 slices shown (14 of 14)]
[im 1/1]
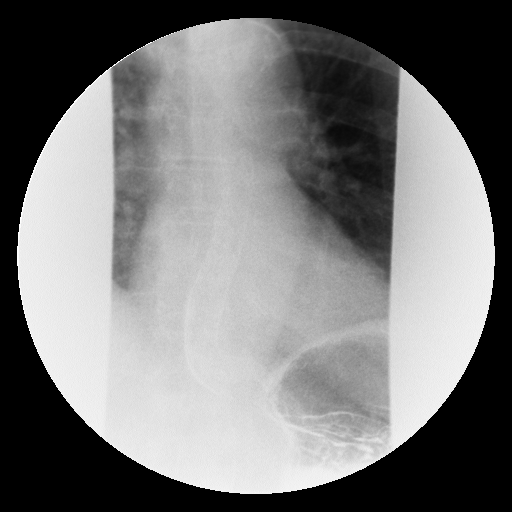

[20 of 22 positions shown; findings below may reference images not displayed]

FINDINGS: Fluoroscopic evaluation of swallowing shows mild
cricopharyngeus impression.  This was noted on prior study from
0774.  These images are not available.  There is disruption of all
peristaltic waves.  No evidence of fold thickening, stricture, or
mass.

The patient was able to swallow a 13 mm barium tablet which freely
passes into the stomach and did not reproduce her symptoms.
IMPRESSION: Nonspecific esophageal motility disorder.

No evidence of stricture or esophagitis.

## 2008-12-28 IMAGING — CT CT HEAD WO/W CM
4 of 7 series · 14 of 30 positions shown, 15 images · IV contrast (75CC OMNI 300)
Comparison: None
COMPARISON: None

CLINICAL DATA: Vertigo.  Refractory  left year and facial pain.
History of melanoma

CT HEAD WITHOUT AND WITH CONTRAST
TECHNIQUE: Contiguous axial images were obtained from the base of
the skull through the vertex without and with intravenous contrast
Contrast: 75 ml 5mnipaque-RAA IV
CLINICAL DATA: Vertigo.  Left ear facial pain.
TEMPORAL BONE CT WITH CONTRAST:
TECHNIQUE: Axial and coronal plane CT imaging of the petrous
temporal bones was performed with thin-collimation image
reconstruction after intravenous contrast administration.
Contrast:  75 ml 5mnipaque-RAA IV

[Series 3: temporal bones/iac's · axial · 0.33mm/px · z∈[-7,+6]mm · 2 of 64 slices shown]
[im 22/64  brain]
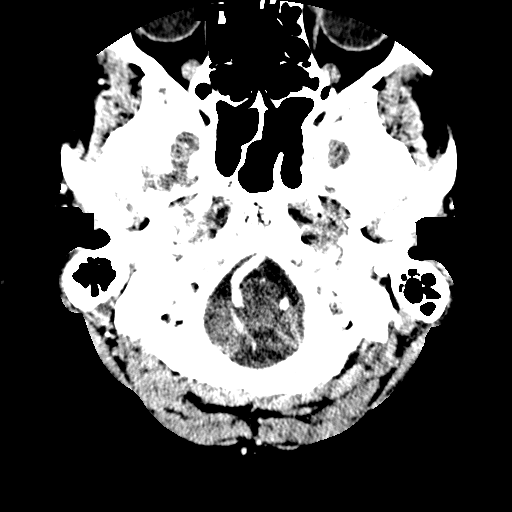
[im 43/64  brain]
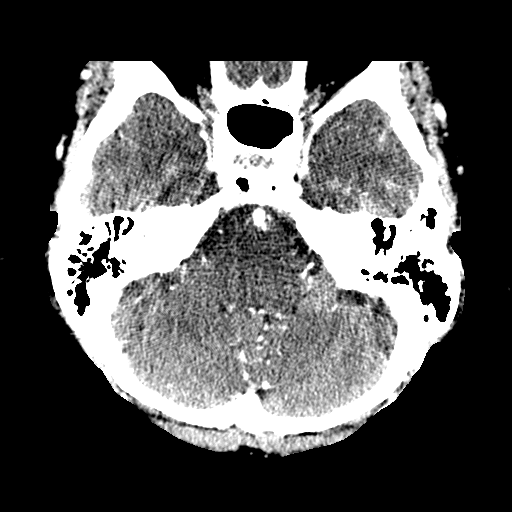

[Series 4: rt/mag/bone · axial · 0.20mm/px · z∈[-12,+11]mm · 4 of 126 slices shown]
[im 26/126  bone]
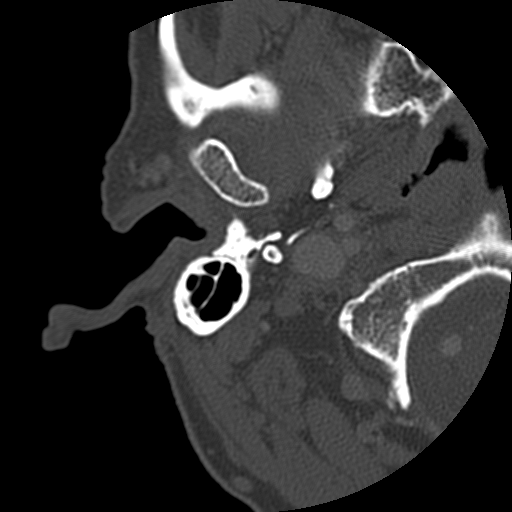
[im 51/126  bone]
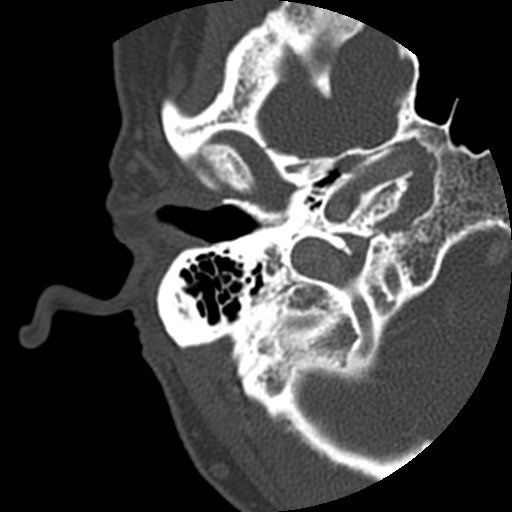
[im 76/126  bone]
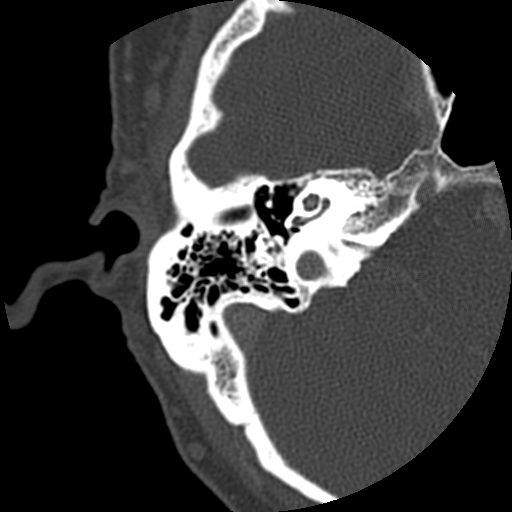
[im 101/126  bone]
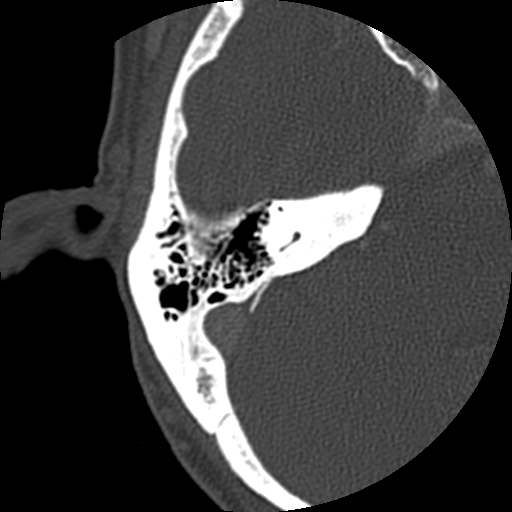

[Series 400: coronal/right · coronal · 0.20mm/px · 4 of 130 slices shown, 5 images]
[im 26/130  brain]
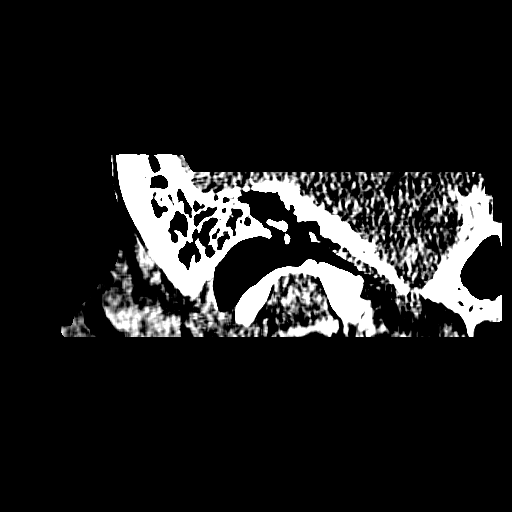
[im 26/130  bone]
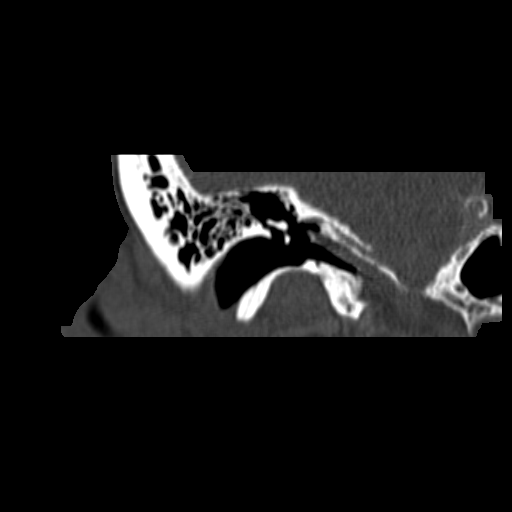
[im 52/130  brain]
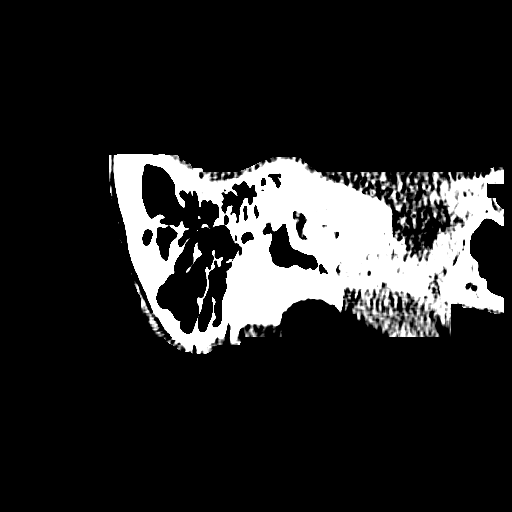
[im 78/130  brain]
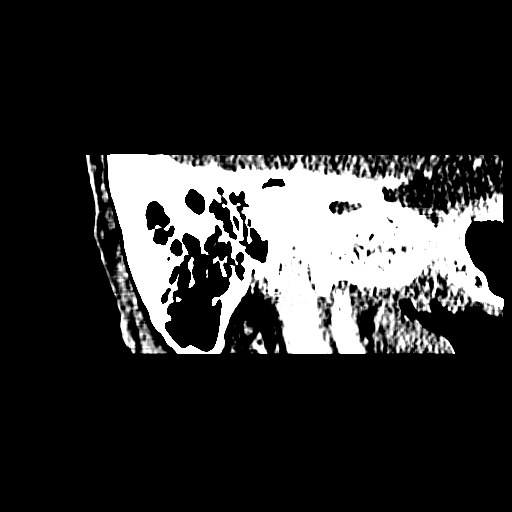
[im 104/130  brain]
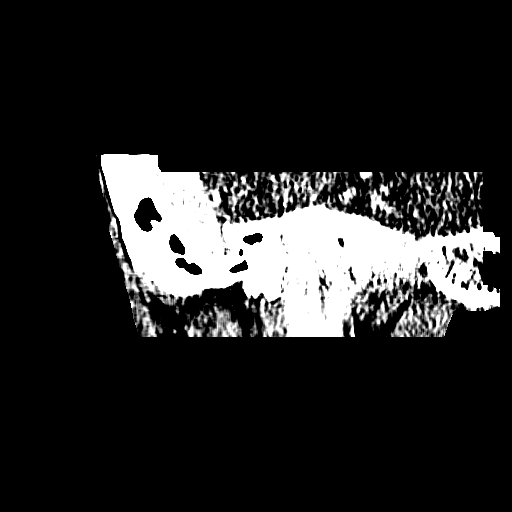

[Series 500: coronal/left · coronal · 0.20mm/px · 4 of 121 slices shown]
[im 25/121  brain]
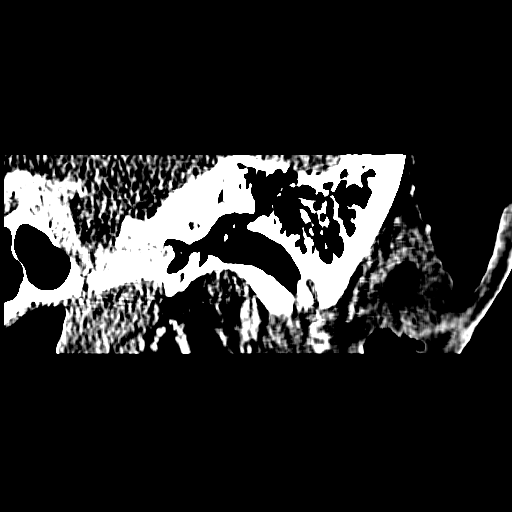
[im 49/121  brain]
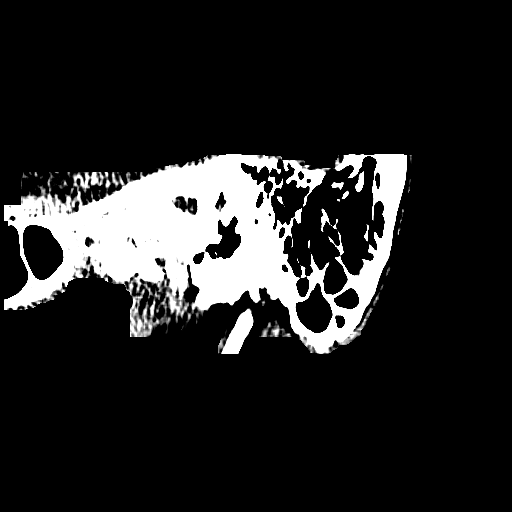
[im 73/121  brain]
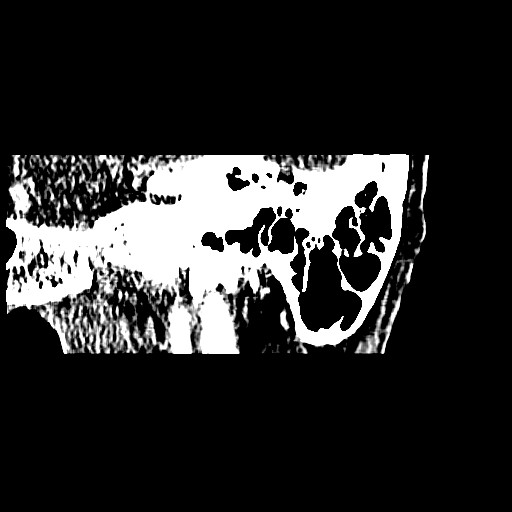
[im 97/121  brain]
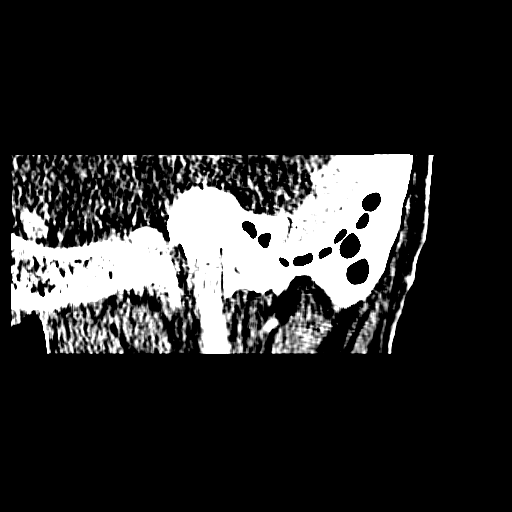

[14 of 30 positions shown; findings below may reference images not displayed]

FINDINGS: The brain has a normal appearance without evidence for
hemorrhage, acute infarction, hydrocephalus or mass lesion.  There
is no extra axial fluid collection.  The skull and paranasal
sinuses are normal.

Following contrast infusion there is a normal enhancement pattern
and no mass lesion is identified.
IMPRESSION: Normal CT of the head without and with contrast.
FINDINGS: On the right mastoid sinus is well developed and is
clear.  The ossicles and the middle ear are normal.  There is no
cholesteatoma.

On the left, the mastoid sinus is well developed and is clear.  The
middle ear and ossicles are normal.  There is no cholesteatoma.  No
mass or bony destruction is identified.
IMPRESSION: Negative CT the temporal bones.

## 2009-02-27 ENCOUNTER — Inpatient Hospital Stay (HOSPITAL_COMMUNITY): Admission: RE | Admit: 2009-02-27 | Discharge: 2009-03-03 | Payer: Self-pay | Admitting: Urology

## 2009-05-17 IMAGING — CR DG CHEST 2V
2 series · 2 of 2 positions shown · non-contrast
Comparison: 05/05/2006.

CLINICAL DATA: Degenerative disease.  Preoperative chest.

CHEST - 2 VIEW

[view not recorded (1 of 2)]
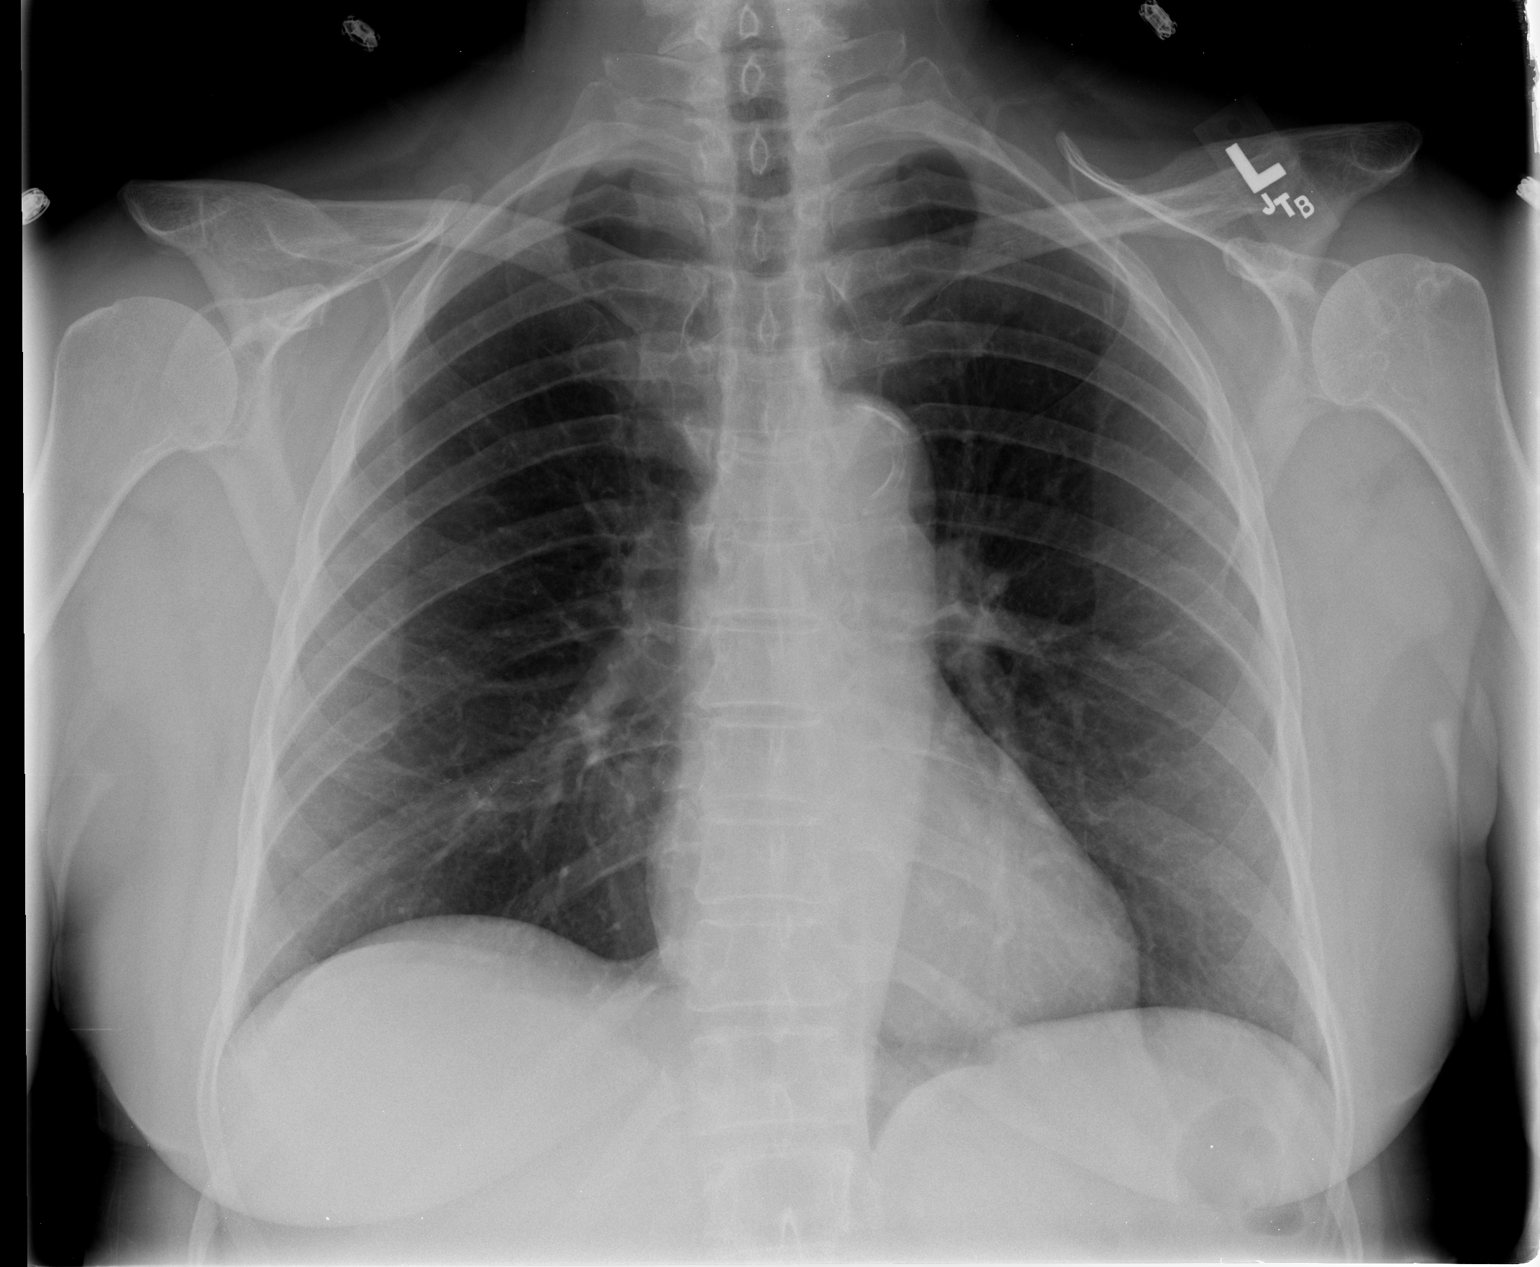

[view not recorded (2 of 2)]
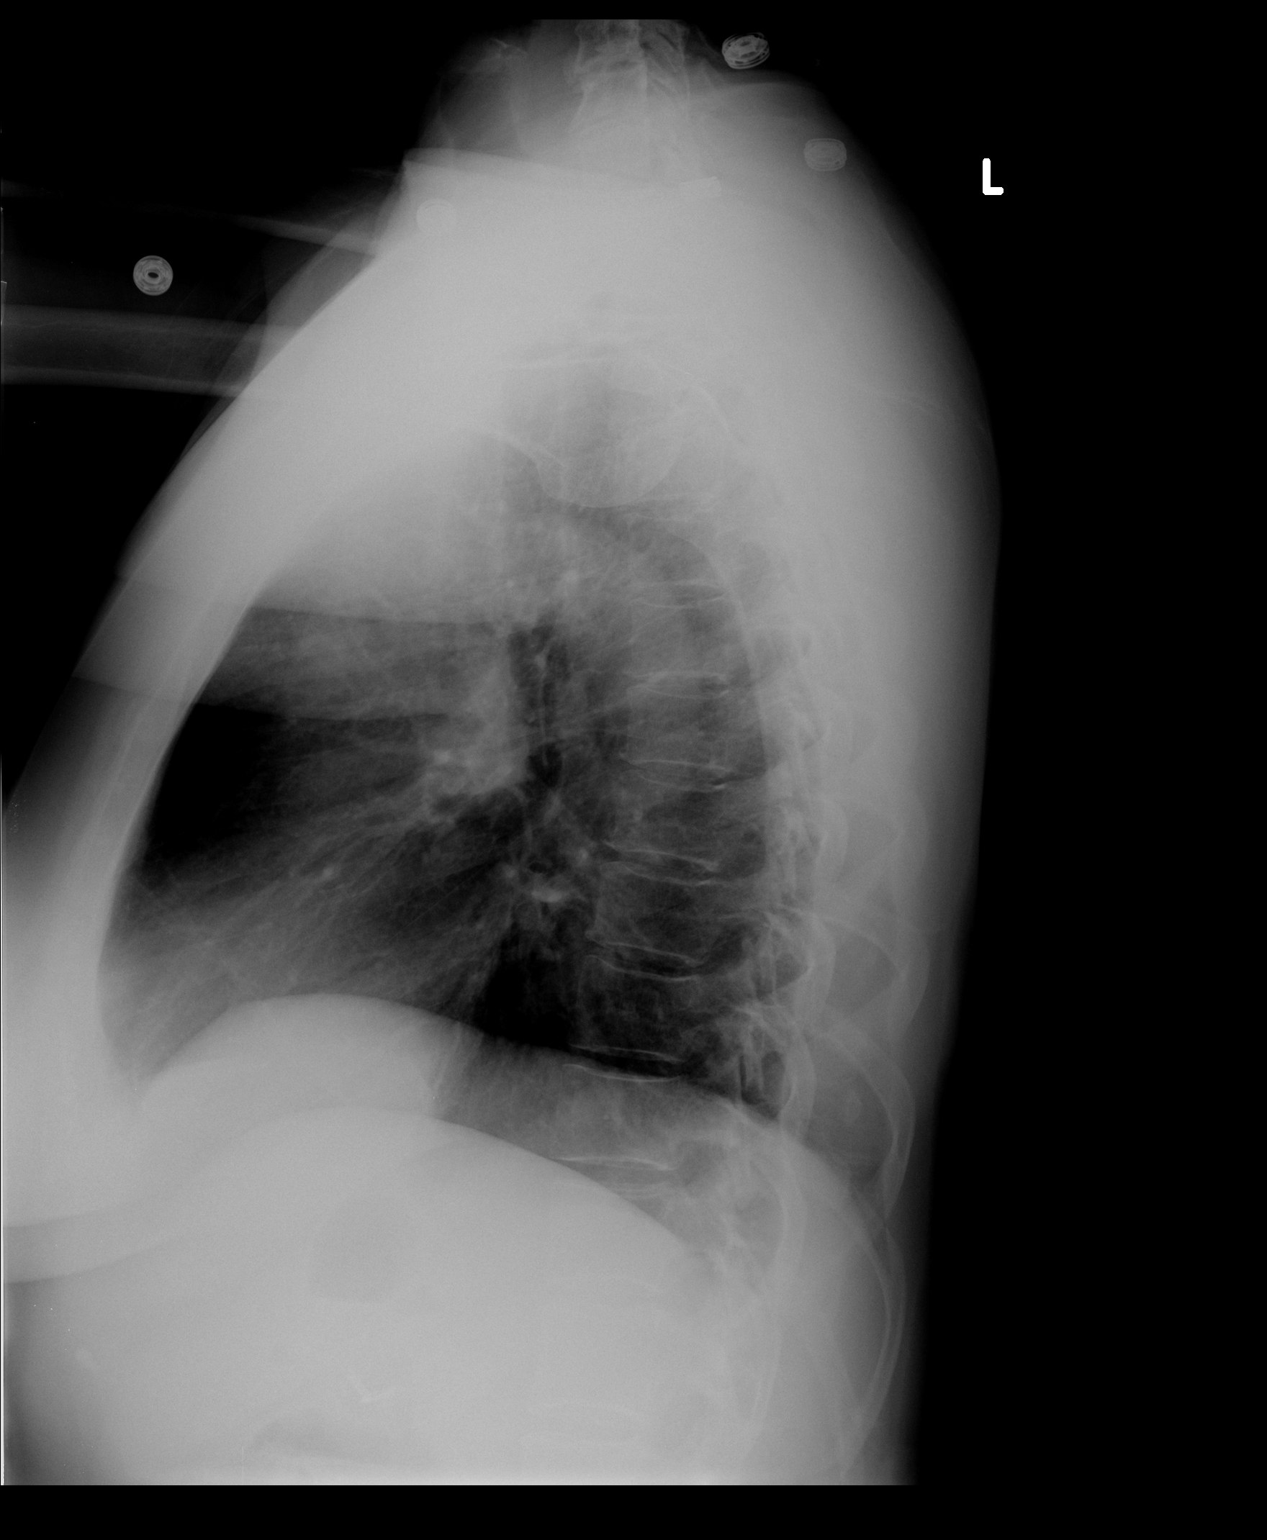

[2 of 2 positions shown; findings below may reference images not displayed]

FINDINGS: Lungs are clear.  The heart and  mediastinal structures
are normal.
IMPRESSION: No evidence for active chest disease.

## 2009-05-24 IMAGING — CR DG KNEE 1-2V PORT*L*
2 series · 2 of 2 positions shown · non-contrast
Comparison: None available.

CLINICAL DATA: Total knee replacement.

PORTABLE LEFT KNEE - 1-2 VIEW

[ap/obl knee]
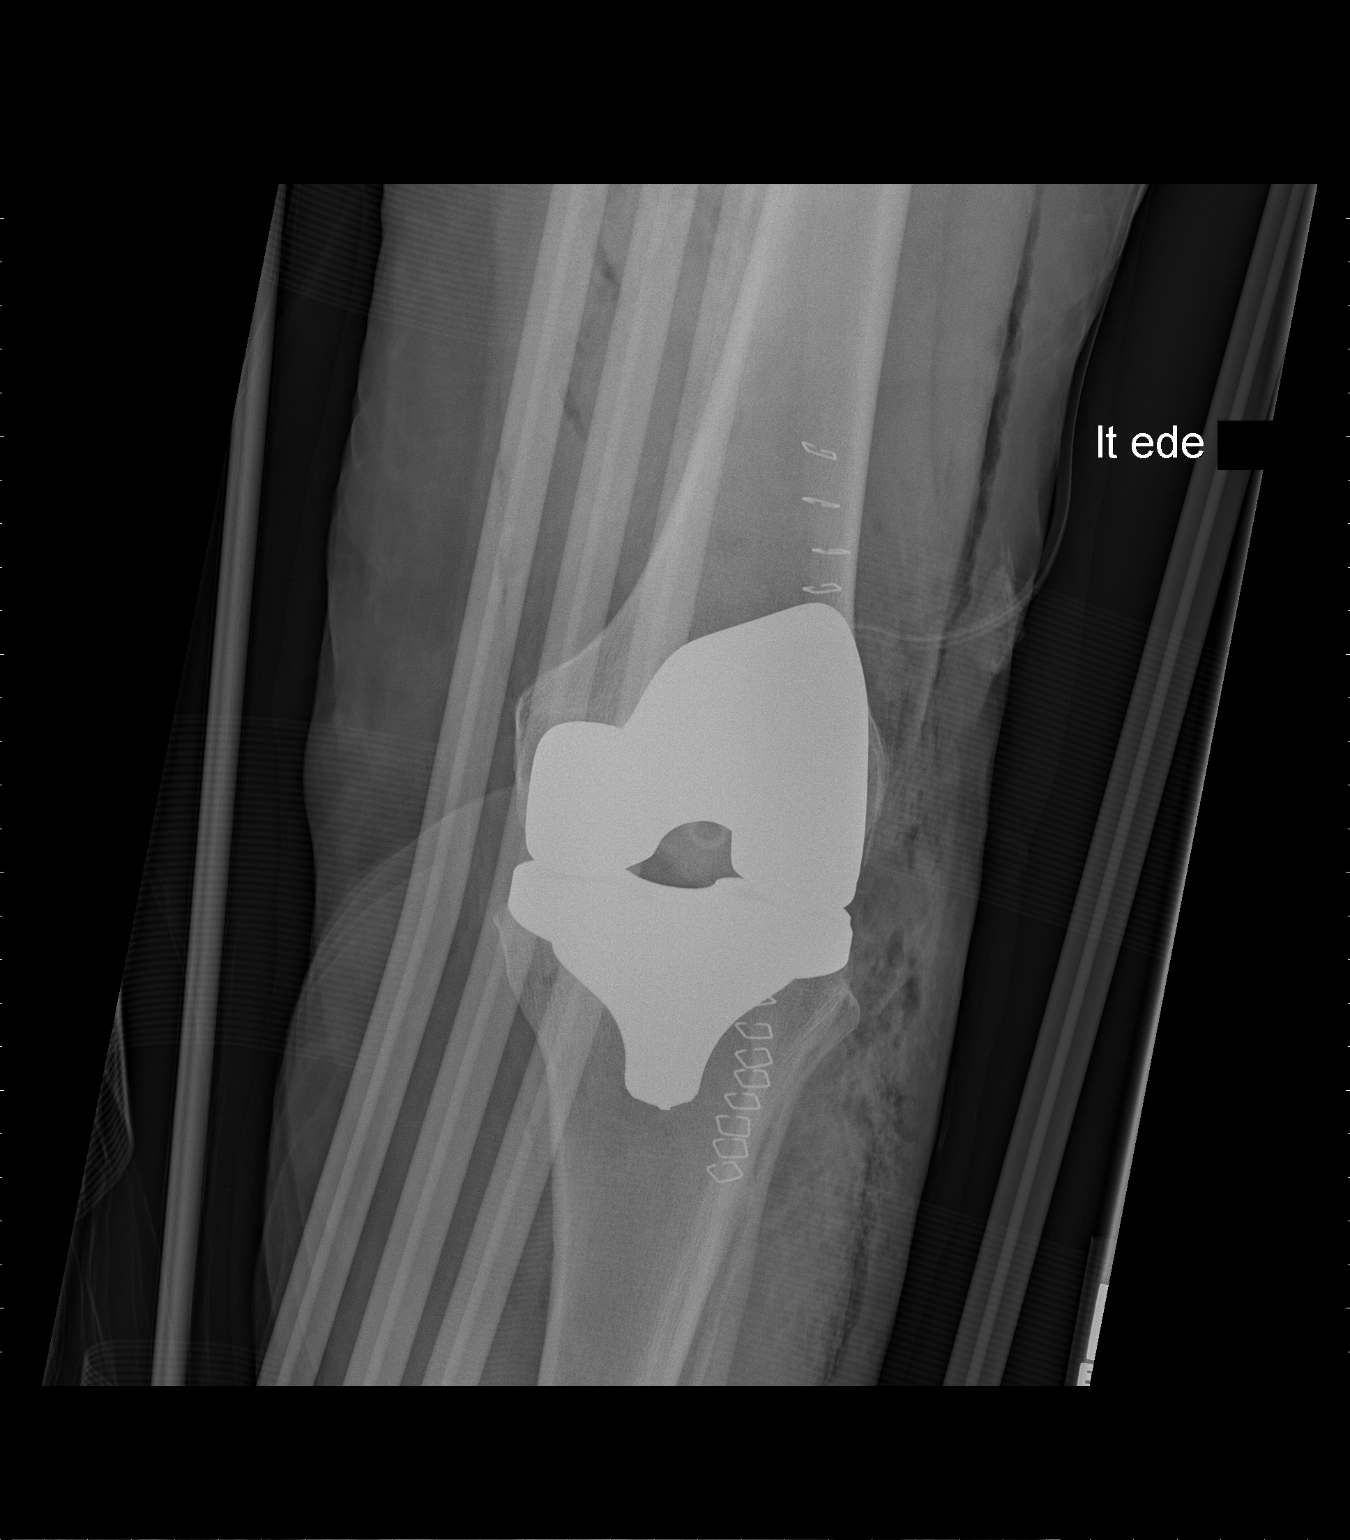

[knee lat]
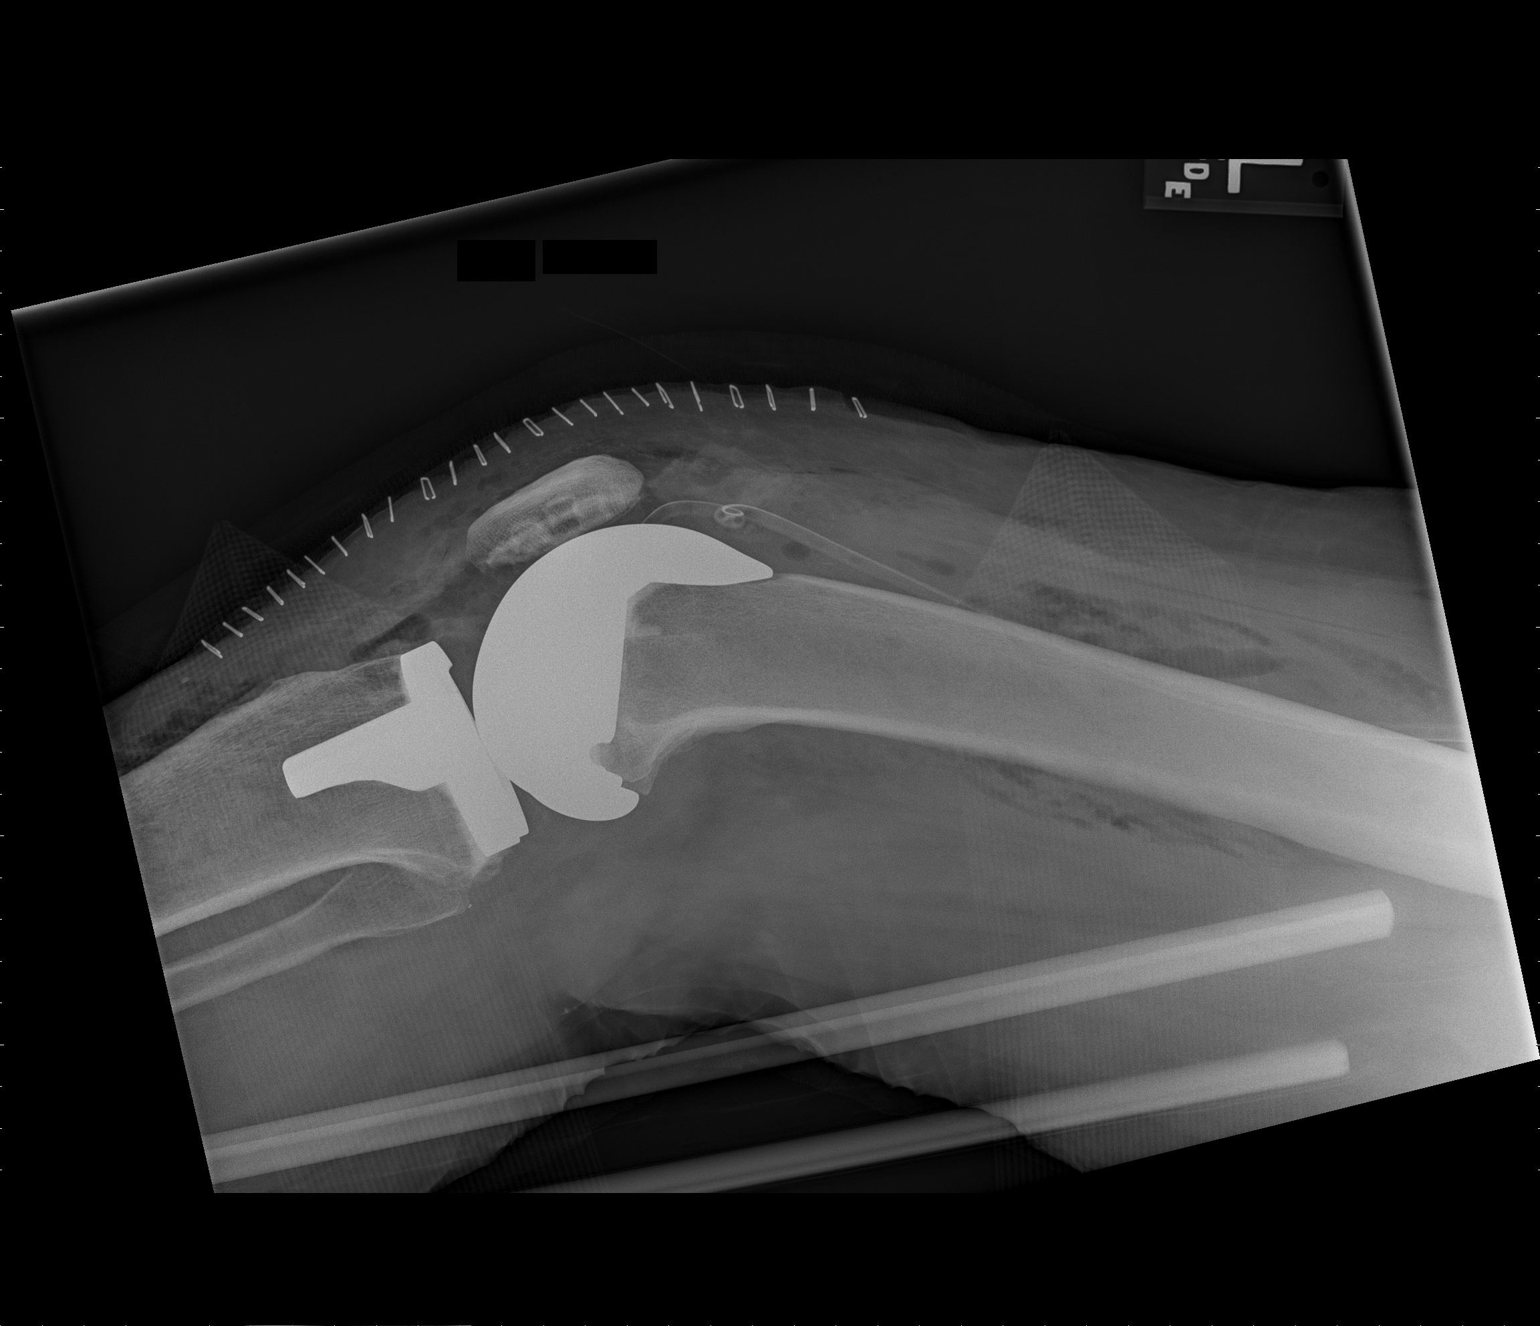

[2 of 2 positions shown; findings below may reference images not displayed]

FINDINGS: The patient has a left total knee arthroplasty.  Surgical
drain and staples are in place.  Gas and soft tissues from surgery
noted.  There is no fracture or acute finding.
IMPRESSION: Left total knee replaced without evidence of complication.

## 2009-06-23 ENCOUNTER — Inpatient Hospital Stay (HOSPITAL_COMMUNITY): Admission: EM | Admit: 2009-06-23 | Discharge: 2009-06-26 | Payer: Self-pay | Admitting: Emergency Medicine

## 2009-06-24 ENCOUNTER — Encounter (INDEPENDENT_AMBULATORY_CARE_PROVIDER_SITE_OTHER): Payer: Self-pay | Admitting: Cardiovascular Disease

## 2009-06-24 HISTORY — PX: OTHER SURGICAL HISTORY: SHX169

## 2009-06-24 HISTORY — PX: CARDIAC CATHETERIZATION: SHX172

## 2009-06-25 ENCOUNTER — Encounter (INDEPENDENT_AMBULATORY_CARE_PROVIDER_SITE_OTHER): Payer: Self-pay | Admitting: Neurology

## 2009-07-08 ENCOUNTER — Inpatient Hospital Stay (HOSPITAL_COMMUNITY): Admission: EM | Admit: 2009-07-08 | Discharge: 2009-07-10 | Payer: Self-pay | Admitting: Emergency Medicine

## 2009-07-09 HISTORY — PX: CARDIAC CATHETERIZATION: SHX172

## 2009-09-11 ENCOUNTER — Encounter (HOSPITAL_COMMUNITY): Admission: RE | Admit: 2009-09-11 | Discharge: 2009-12-10 | Payer: Self-pay | Admitting: Cardiology

## 2009-09-24 ENCOUNTER — Encounter: Admission: RE | Admit: 2009-09-24 | Discharge: 2009-09-24 | Payer: Self-pay | Admitting: Internal Medicine

## 2009-09-26 ENCOUNTER — Observation Stay (HOSPITAL_COMMUNITY): Admission: EM | Admit: 2009-09-26 | Discharge: 2009-09-27 | Payer: Self-pay | Admitting: Emergency Medicine

## 2009-11-21 ENCOUNTER — Emergency Department (HOSPITAL_COMMUNITY): Admission: EM | Admit: 2009-11-21 | Discharge: 2009-11-21 | Payer: Self-pay | Admitting: Emergency Medicine

## 2009-12-11 ENCOUNTER — Encounter (HOSPITAL_COMMUNITY): Admission: RE | Admit: 2009-12-11 | Discharge: 2009-12-26 | Payer: Self-pay | Admitting: Cardiology

## 2009-12-27 ENCOUNTER — Encounter (HOSPITAL_COMMUNITY): Admission: RE | Admit: 2009-12-27 | Discharge: 2010-02-25 | Payer: Self-pay | Admitting: Cardiology

## 2009-12-30 ENCOUNTER — Inpatient Hospital Stay (HOSPITAL_COMMUNITY): Admission: EM | Admit: 2009-12-30 | Discharge: 2010-01-01 | Payer: Self-pay | Admitting: Cardiology

## 2009-12-31 ENCOUNTER — Encounter (INDEPENDENT_AMBULATORY_CARE_PROVIDER_SITE_OTHER): Payer: Self-pay | Admitting: Cardiology

## 2010-01-20 ENCOUNTER — Encounter: Admission: RE | Admit: 2010-01-20 | Discharge: 2010-01-20 | Payer: Self-pay | Admitting: Sports Medicine

## 2010-03-22 IMAGING — CT CT HEAD W/O CM
1 series · 16 of 30 positions shown, 20 images · non-contrast
Comparison: 07/05/2007

CLINICAL DATA: Numbness, hypertension

CT HEAD WITHOUT CONTRAST
TECHNIQUE: Contiguous axial images were obtained from the base of
the skull through the vertex without contrast.

[Series 2: head_seq 4.5 h37s st · axial · 0.43mm/px · z∈[+910,+1054]mm · 16 of 36 slices shown, 20 images]
[im 2/36  brain]
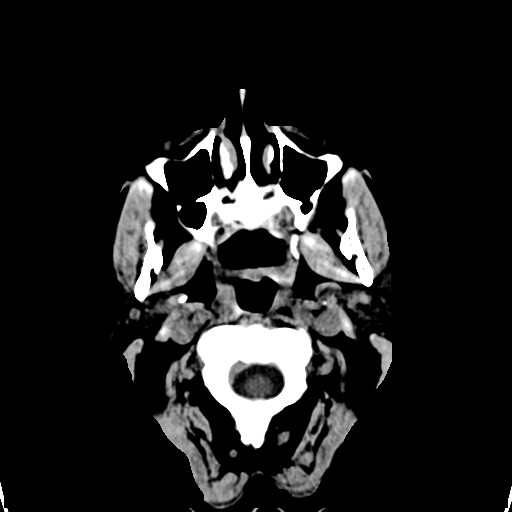
[im 2/36  bone]
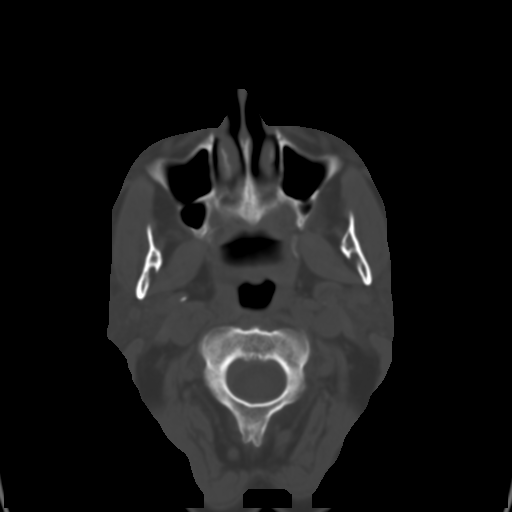
[im 4/36  brain]
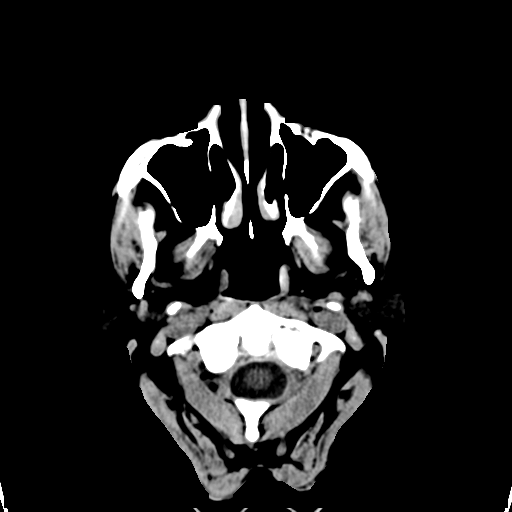
[im 7/36  brain]
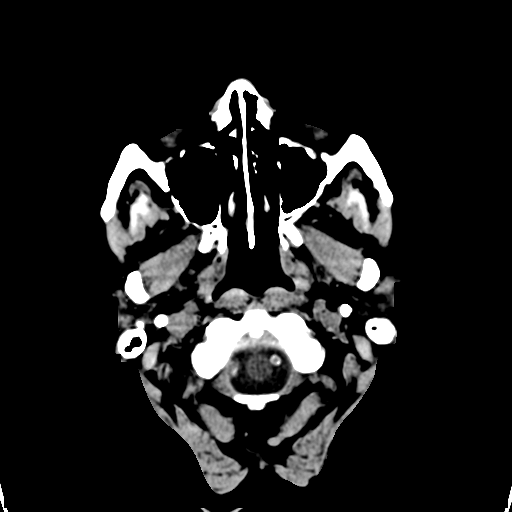
[im 9/36  brain]
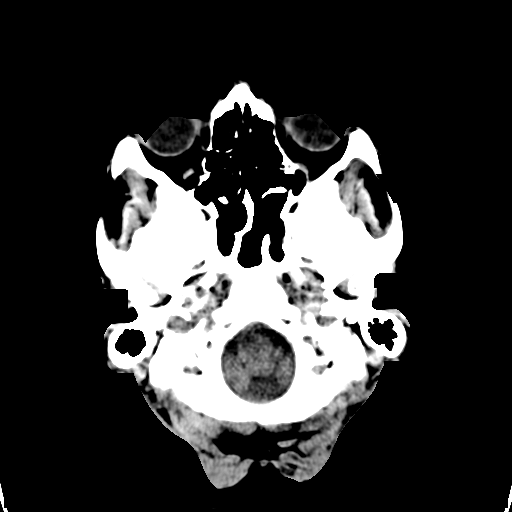
[im 10/36  brain]
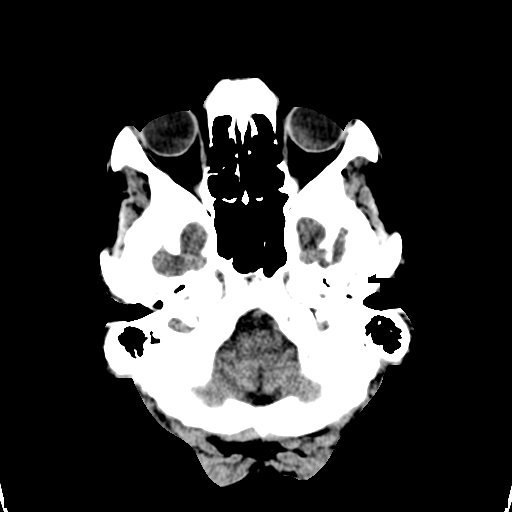
[im 10/36  bone]
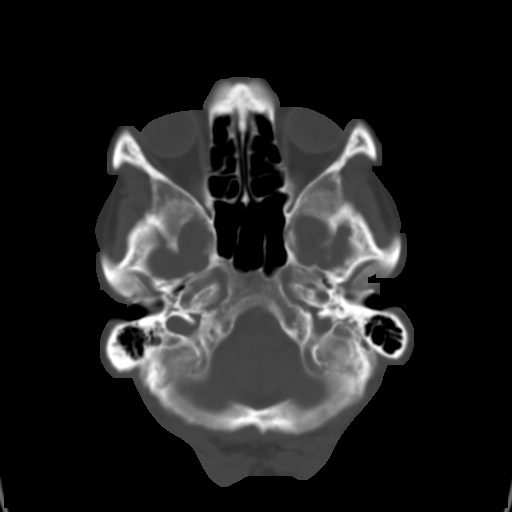
[im 13/36  brain]
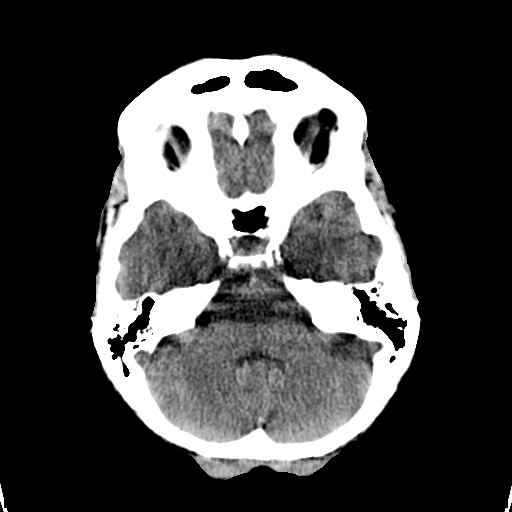
[im 15/36  brain]
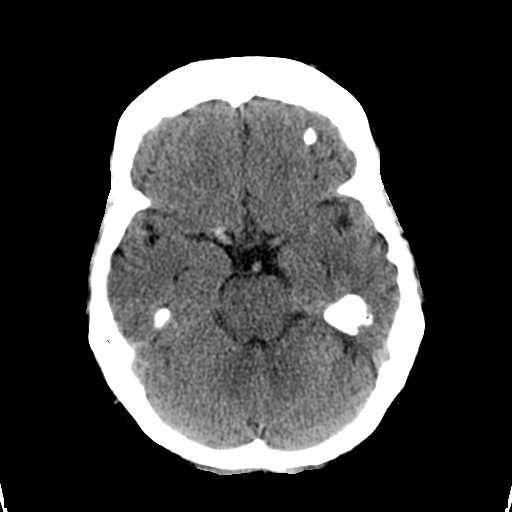
[im 17/36  brain]
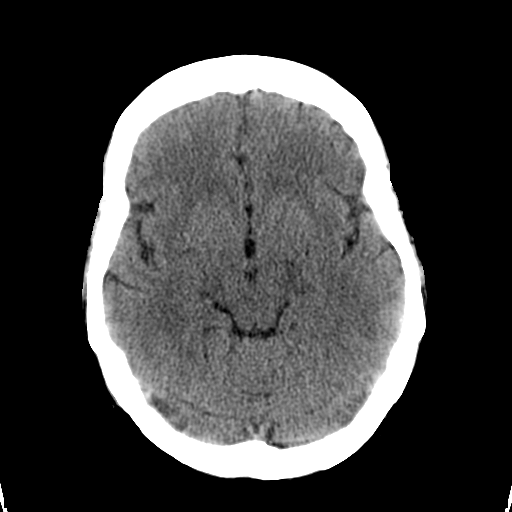
[im 19/36  brain]
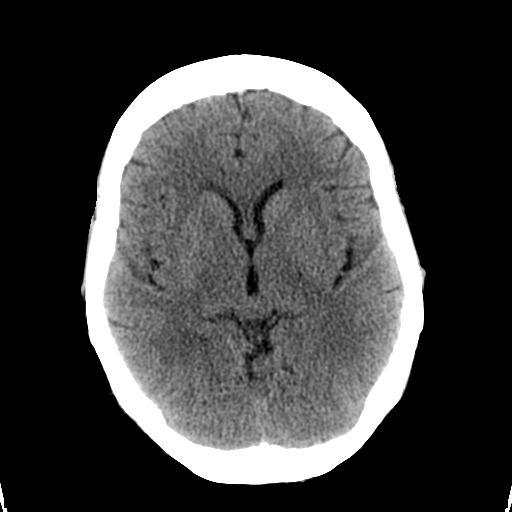
[im 19/36  bone]
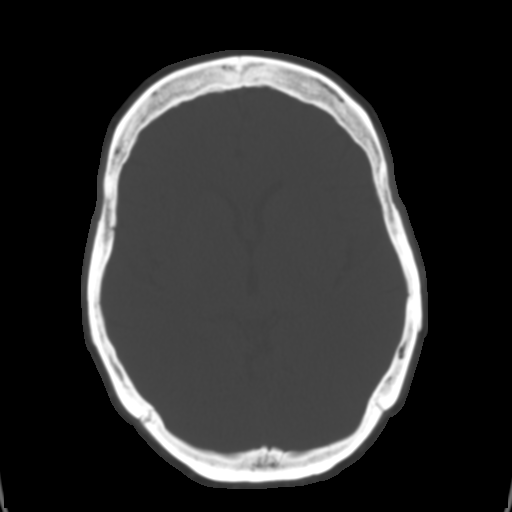
[im 21/36  brain]
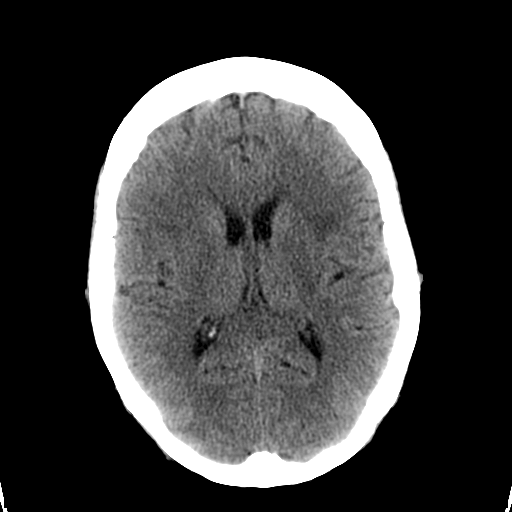
[im 23/36  brain]
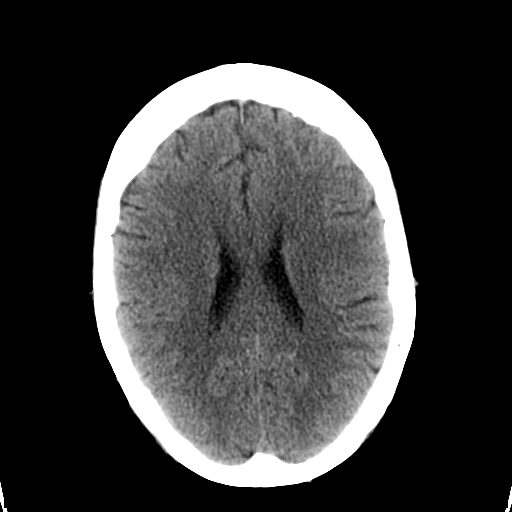
[im 26/36  brain]
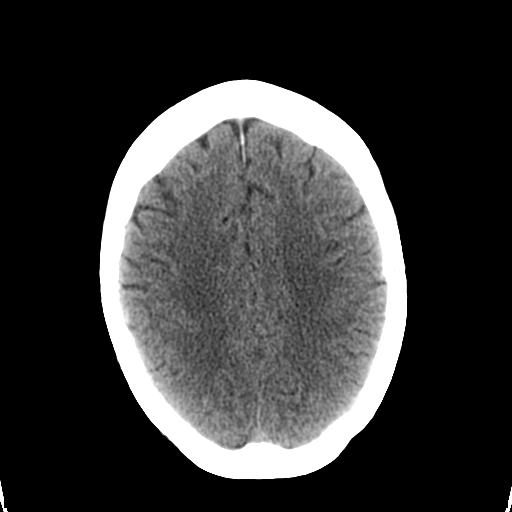
[im 27/36  brain]
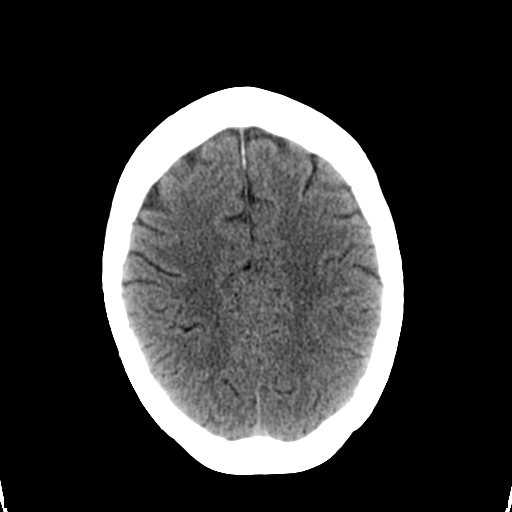
[im 27/36  bone]
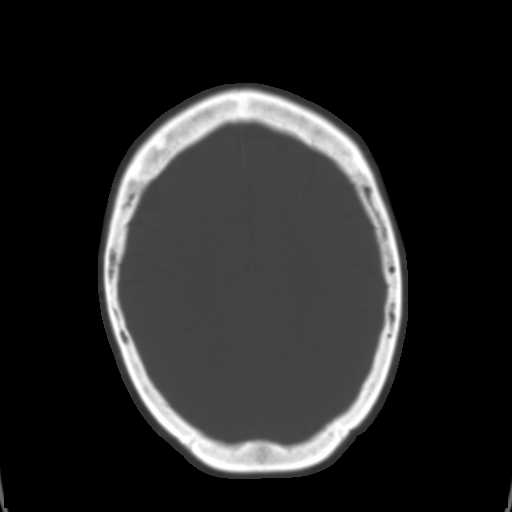
[im 29/36  brain]
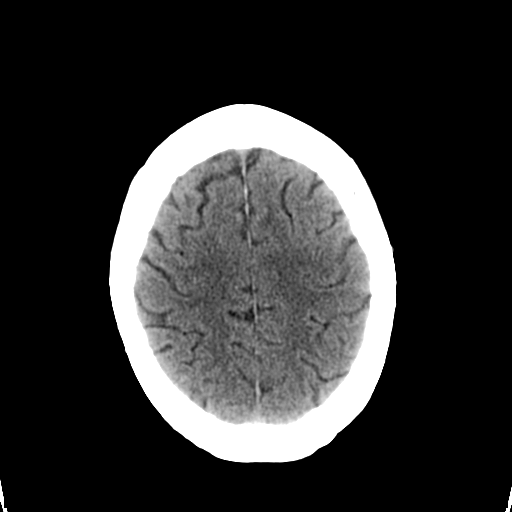
[im 32/36  brain]
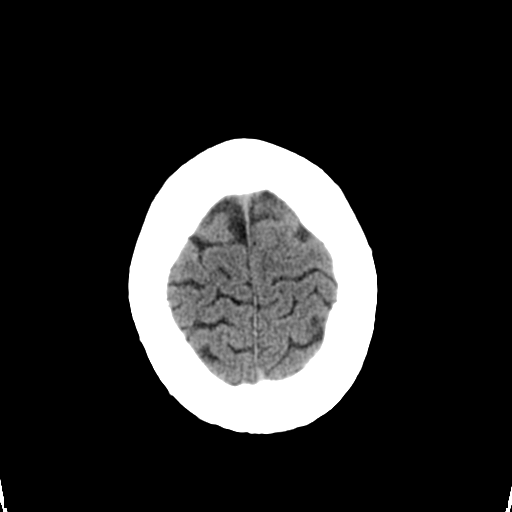
[im 34/36  brain]
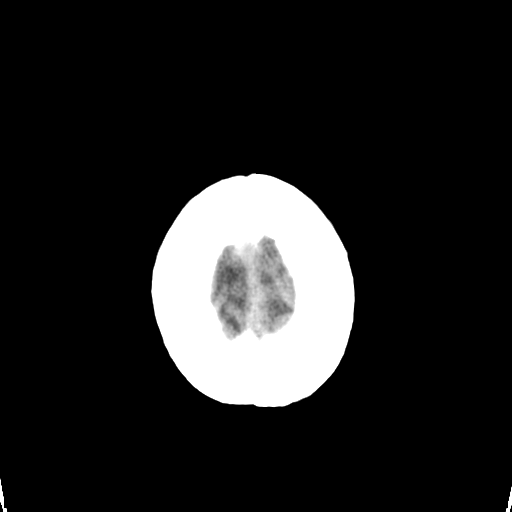

[16 of 30 positions shown; findings below may reference images not displayed]

FINDINGS: Minimal parenchymal atrophy. Patchy areas of
hypoattenuation in deep and periventricular white matter
bilaterally. Negative for acute intracranial hemorrhage, mass
lesion, acute infarction, midline shift, or mass-effect. Acute
infarct may be inapparent on noncontrast CT. Ventricles and sulci
symmetric. Bone windows demonstrate no focal lesion.
IMPRESSION: 1. Negative for bleed or other acute intracranial process.

2. Nonspecific white matter changes

## 2010-03-22 IMAGING — MR MR MRA HEAD W/O CM
17 series · 17 of 17 positions shown · IV contrast (multihance)
Comparison: 09/26/2008 CT

MRI HEAD WITHOUT AND WITH CONTRAST

CLINICAL DATA: Left facial numbness and left-sided weakness.
Onset 09/25/2008.   Hypertension.  Diabetes mellitus.  Mitral valve
disorder.  History of malignant melanoma of the leg.
Hyperlipidemia.

MRI HEAD WITHOUT AND WITH CONTRAST
MRA HEAD WITHOUT CONTRAST
MRA NECK WITHOUT AND WITH CONTRAST
TECHNIQUE: Multiplanar, multiecho pulse sequences of the brain and
surrounding structures were obtained according to standard protocol
without and with intravenous contrast.  Angiographic images of the
Circle of Willis were obtained using MRA technique without
intravenous contrast.  Angiographic images of the neck were
obtained using MRA technique without and with intravenous contrast.
Contrast: MultiHance 17 ml.

[Series 1: loc · axial · 10.0mm · 0.51mm/px · 1 of 13 slices shown]
[im 1/13]
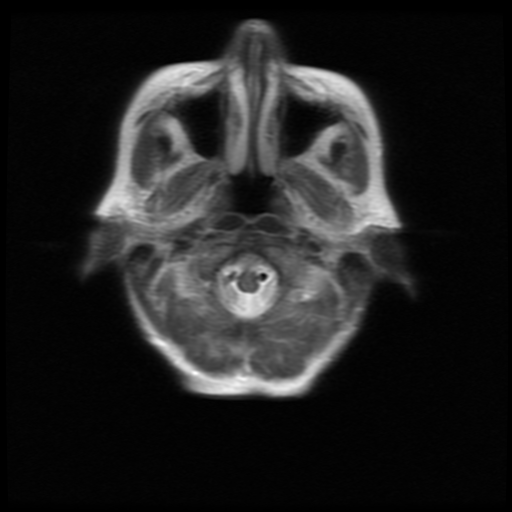

[Series 3: T1 · sagittal · 5.0mm · 0.47mm/px · 1 of 12 slices shown]
[im 1/12]
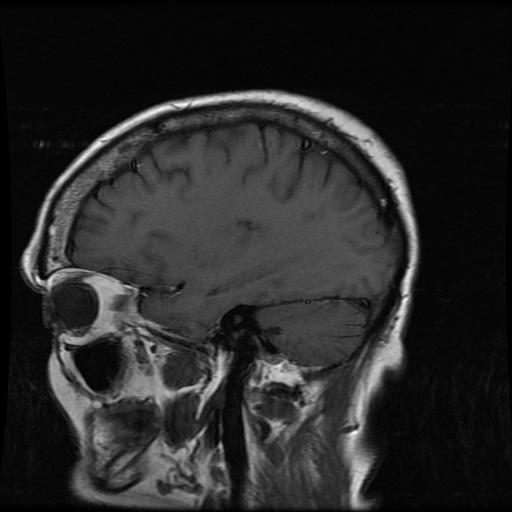

[Series 4: DWI · axial · 5.0mm · 1.09mm/px · 1 of 58 slices shown]
[im 1/58]
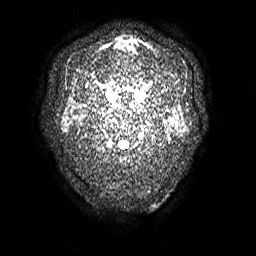

[Series 5: (id) mt fs · axial · 1.4mm · 0.39mm/px · 1 of 137 slices shown]
[im 1/137]
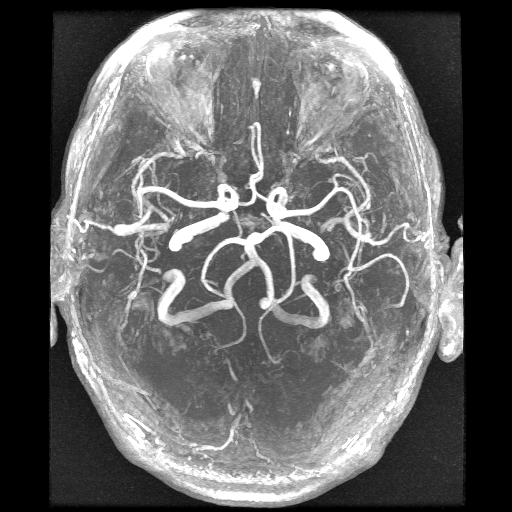

[Series 6: T2 · axial · 5.0mm · 0.47mm/px · 1 of 22 slices shown (1 of 2)]
[im 1/22]
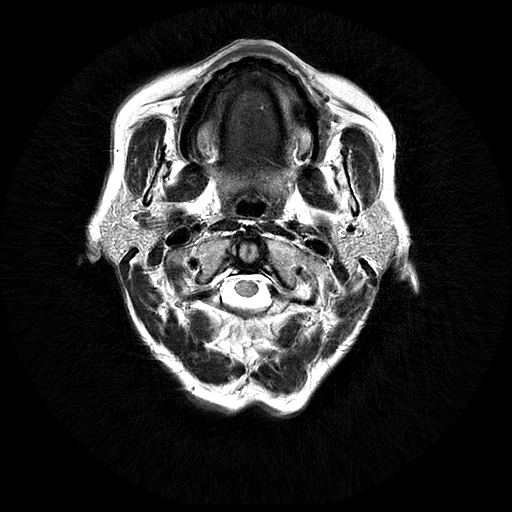

[Series 7: FLAIR · axial · 5.0mm · 0.47mm/px · 1 of 22 slices shown]
[im 1/22]
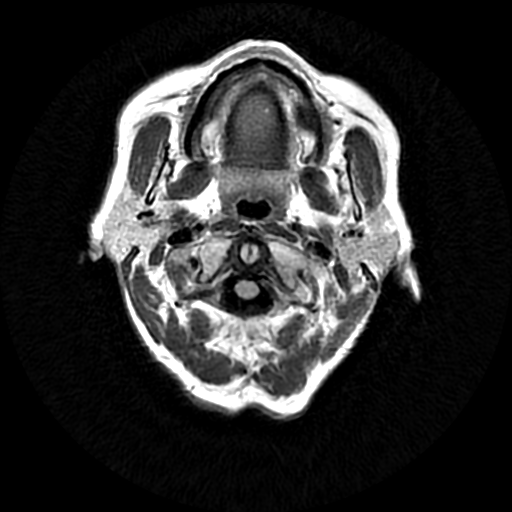

[Series 8: ax mpgr · axial · 5.0mm · 0.43mm/px · 1 of 21 slices shown]
[im 1/21]
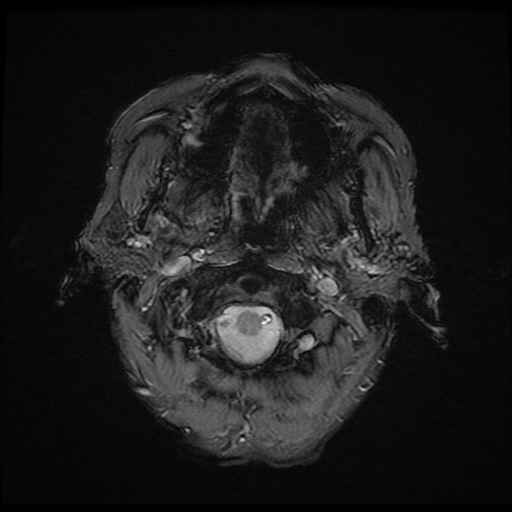

[Series 9: ax fspgr 3d · axial · non-contrast · 3.0mm · 0.86mm/px · 1 of 60 slices shown]
[im 1/60]
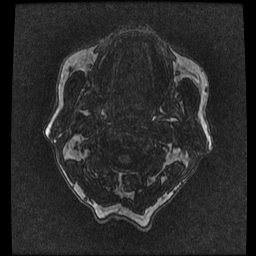

[Series 10: T2-star · axial · 5.0mm · 1.76mm/px · 1 of 41 slices shown]
[im 1/41]
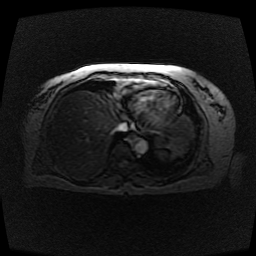

[Series 11: ax (id) · axial · 2.8mm · 0.47mm/px · 1 of 160 slices shown]
[im 1/160]
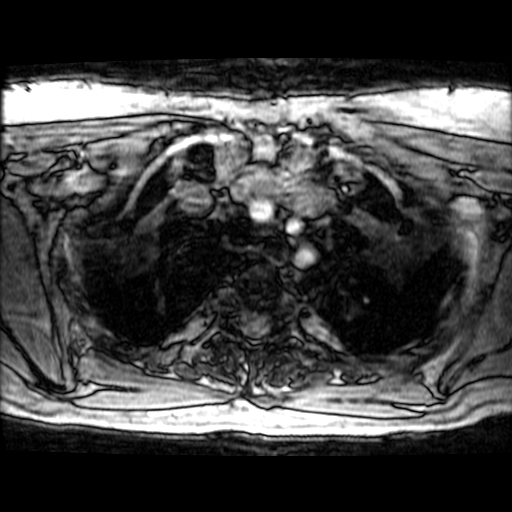

[Series 13: T2 · coronal · 5.0mm · 0.39mm/px · 1 of 24 slices shown (2 of 2)]
[im 1/24]
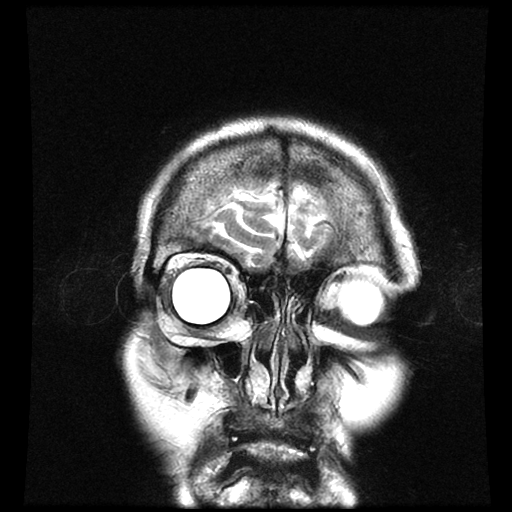

[Series 14: post ax fspgr · axial · 3.0mm · 0.43mm/px · 1 of 116 slices shown]
[im 1/116]
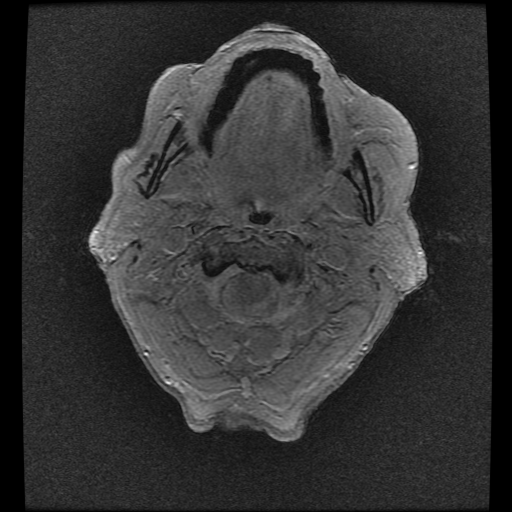

[Series 15: T1 post-contrast · coronal · 5.0mm · 0.43mm/px · 1 of 25 slices shown]
[im 1/25]
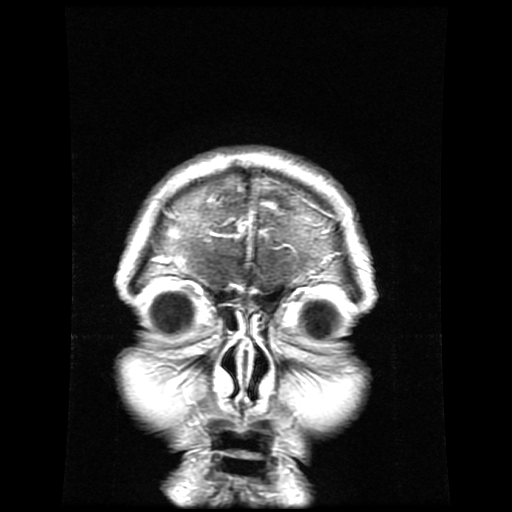

[Series 1200: cor cemra ft · coronal · 1.4mm · 0.59mm/px · 1 of 92 slices shown]
[im 1/92]
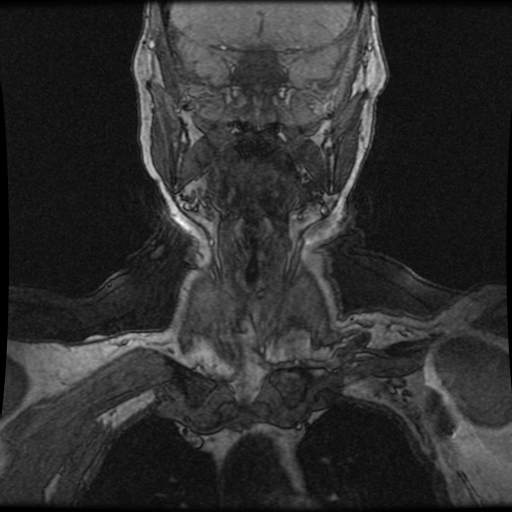

[Series 1201: ph1/cor cemra ft · coronal · 1.4mm · 0.59mm/px · 1 of 92 slices shown]
[im 1/92]
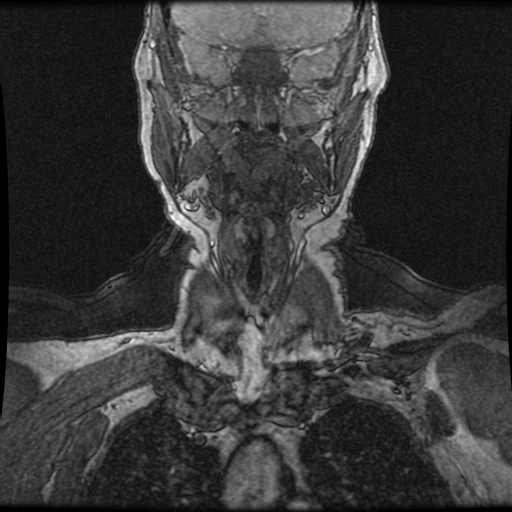

[Series 1202: ph2/cor cemra ft · coronal · 1.4mm · 0.59mm/px · 1 of 92 slices shown]
[im 1/92]
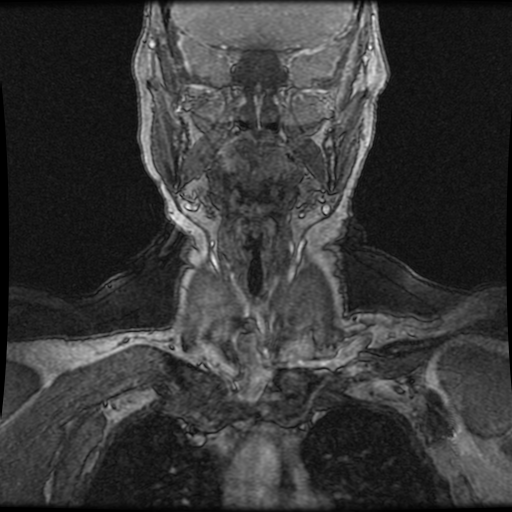

[((id)/(id)/1)-((id)/(id)/1) · coronal · 1.4mm · 0.59mm/px · 1 of 92 slices shown]
[im 1/92]
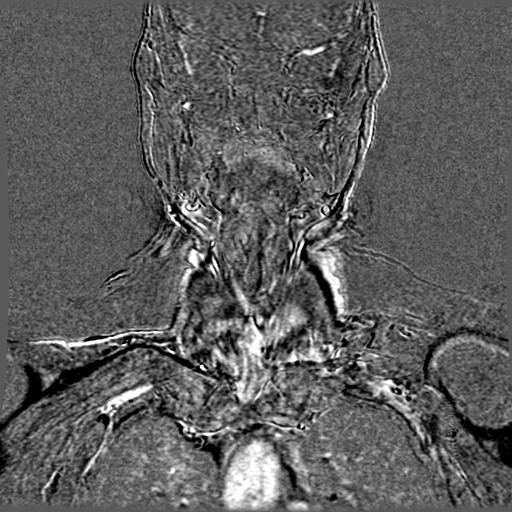

[17 of 17 positions shown; findings below may reference images not displayed]

FINDINGS: There is no evidence for acute infarction, intracranial
hemorrhage, mass lesion, hydrocephalus, or extra-axial fluid.
Mild age appropriate atrophy is present.  Chronic microvascular
ischemic change is present in the periventricular and subcortical
white matter. There is a remote subcortical infarct affecting the
left posterior frontotemporal region.  There is no evidence for
chronic hemorrhage.  Sinuses and mastoids are clear.  Post
infusion, there is no abnormal intracranial enhancement. There is
no visible metastatic disease.  Calvarium grossly intact.
IMPRESSION: Mild atrophy and small vessel disease.  No acute intracranial
findings.

MRA HEAD WITHOUT CONTRAST
FINDINGS: There is a focal 50% stenosis of the supraclinoid
internal carotid artery in the left.  The right internal carotid
artery, as well as the basilar artery, are widely patent.
Vertebrals are codominant.  No proximal stenosis of the anterior,
middle, or posterior cerebral arteries can be identified.  There is
no visible intracranial aneurysm within the limits of this
technique.  Distal MCA and PCA branches show mild irregularity
suggesting intracranial atherosclerotic disease.
IMPRESSION: Mild irregularity of the distal MCA and PCA branches suggests
intracranial atherosclerotic change.

50% stenosis, supraclinoid segment, left internal carotid artery.

 MRA NECK WITHOUT AND WITH CONTRAST
FINDINGS: Great vessels demonstrate conventional branching pattern
from the arch.  There is artifactual signal dropout affecting the
proximal left subclavian.  The other great vessels appear
unremarkable.  No definite vertebral ostial stenosis.

At the right carotid bifurcation, there is mild nonstenotic
posterior wall irregularity without ulceration or flow reducing
lesion.  Cervical internal carotid artery is widely patent.  On the
left, there is similar nonstenotic smooth posterior wall carotid
bifurcation plaque without flow reducing lesion or visible
ulceration.  The left cervical internal carotid artery is widely
patent. Both vertebral arteries are widely patent in the neck and
contribute equally to the basilar.
IMPRESSION: Nonstenotic atherosclerotic change both carotid bifurcations.

Both vertebral arteries widely patent in the neck.

## 2010-03-22 IMAGING — CR DG FOREARM 2V*R*
2 series · 2 of 2 positions shown · non-contrast
Comparison: None

CLINICAL DATA: Infection

RIGHT FOREARM - 2 VIEW

[x forearm ap right]
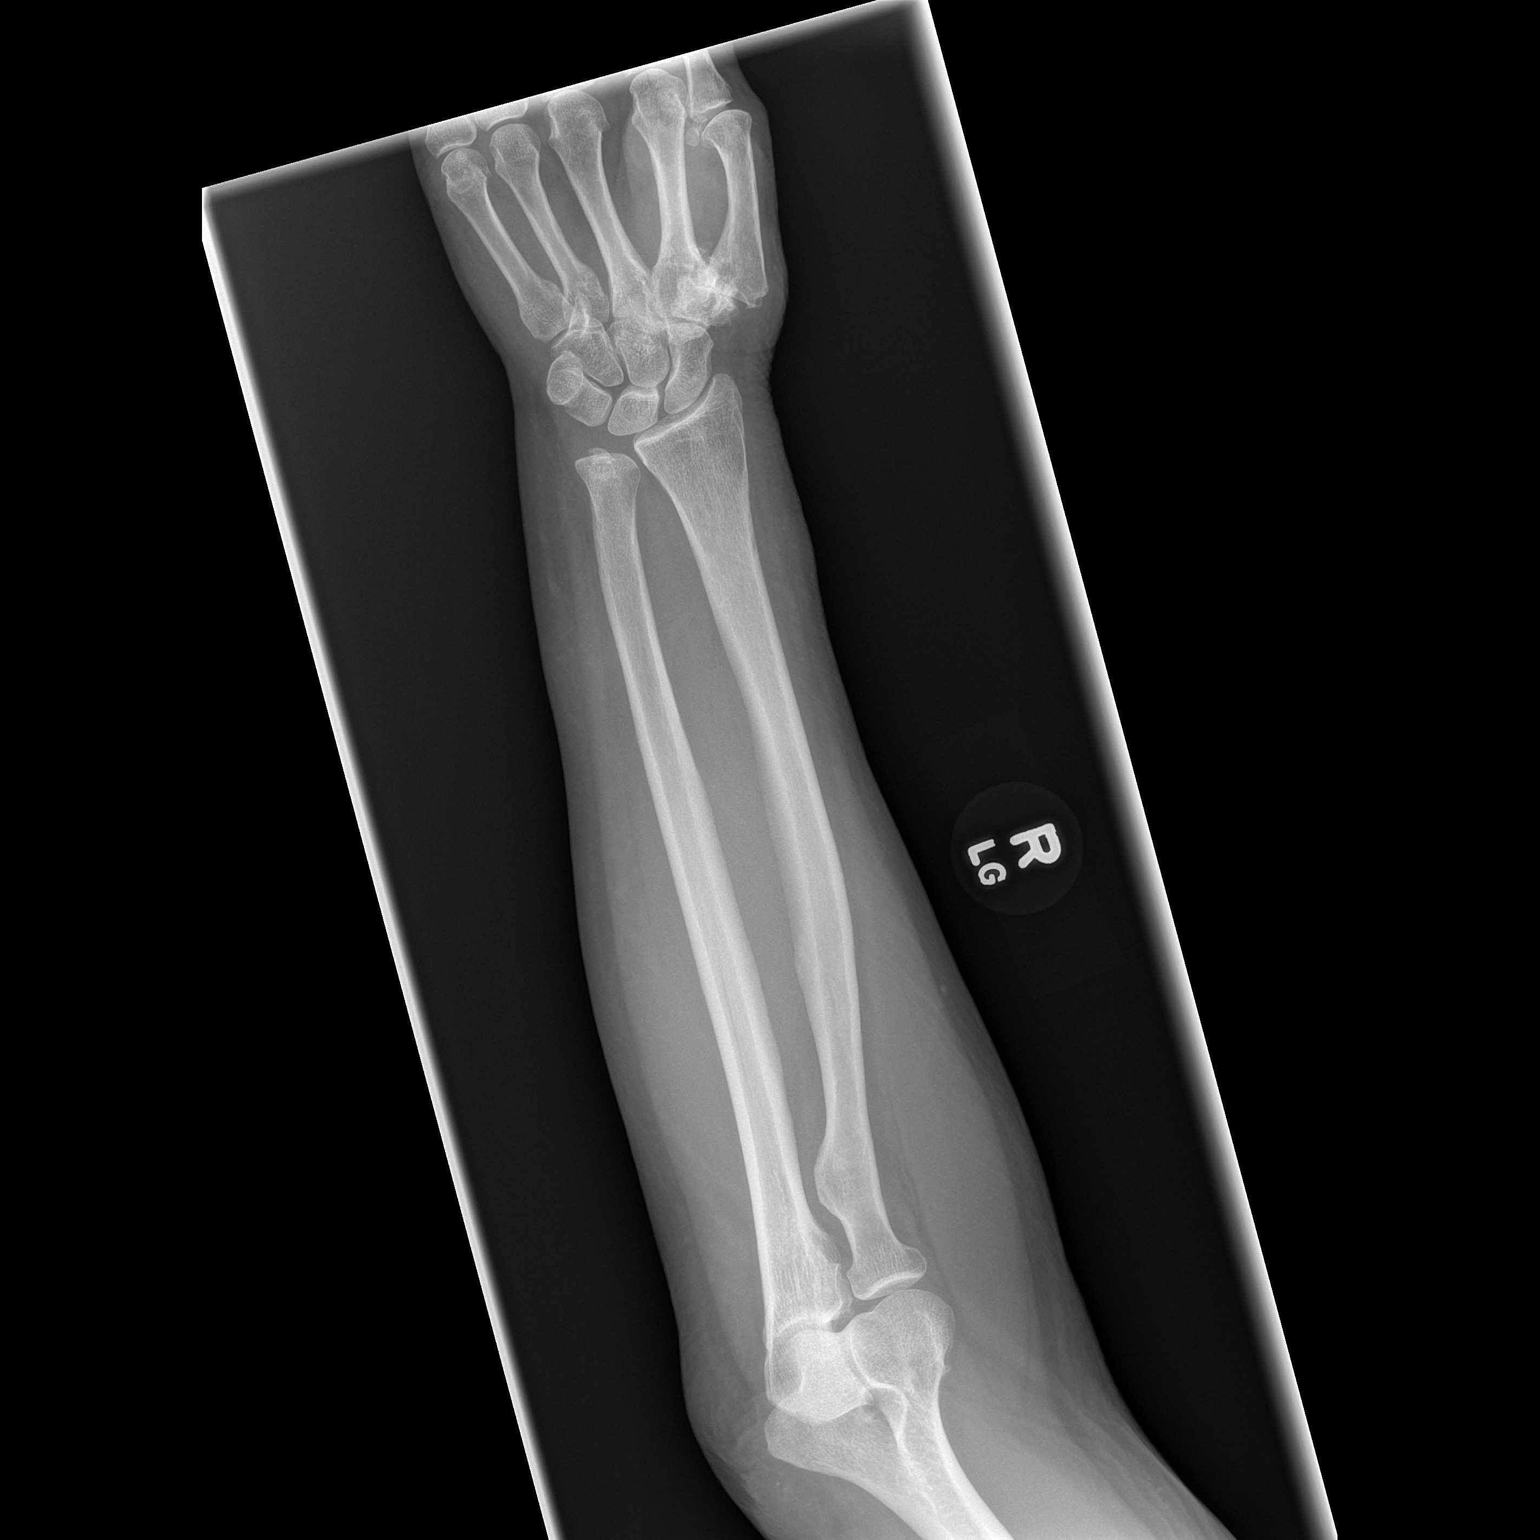

[x forearm lat right]
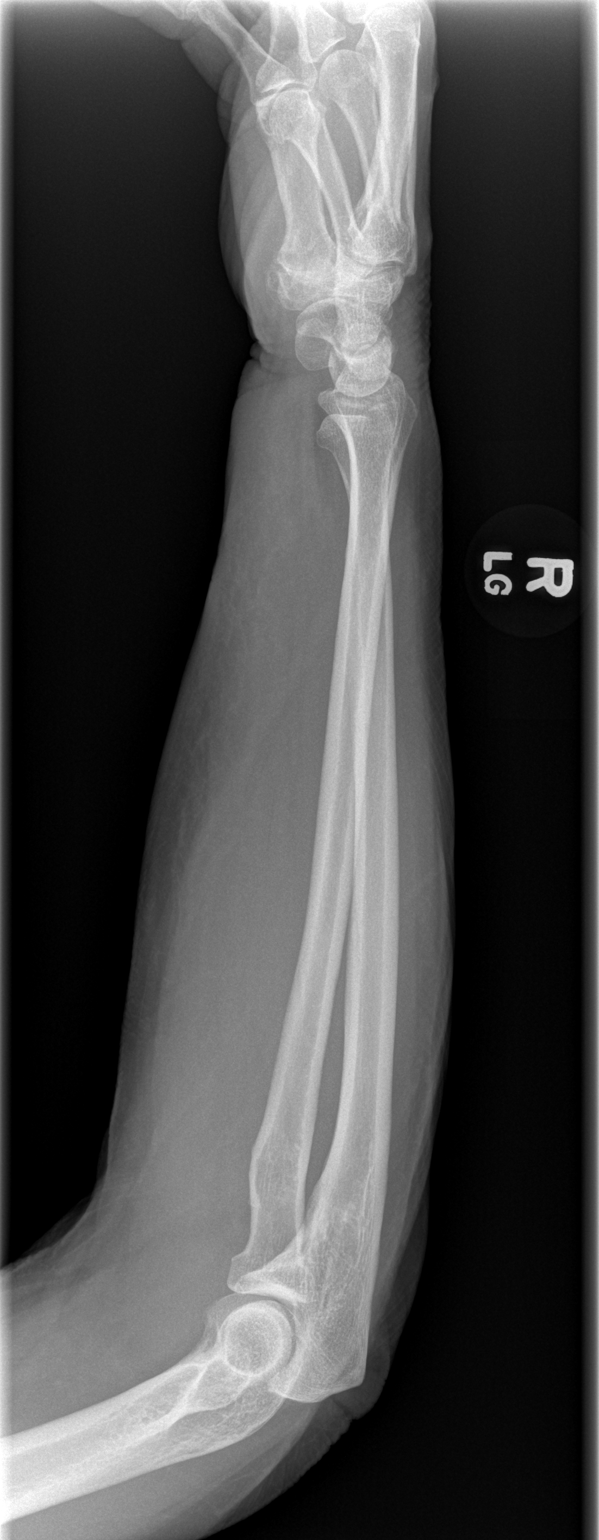

[2 of 2 positions shown; findings below may reference images not displayed]

FINDINGS: Advanced degenerative changes in the first
carpometacarpal articulation.  Negative for fracture, dislocation,
or other acute bone injury.  No focal cortical destruction.  Soft
tissues unremarkable.
IMPRESSION: 1.  Negative for acute bony abnormality.
2.  Advanced degenerative changes in the the first CMC joint

## 2010-05-01 IMAGING — CR DG CHEST 2V
2 series · 2 of 2 positions shown · non-contrast
Comparison: 09/26/2008.

CLINICAL DATA: Cough and fever.

CHEST - 2 VIEW

[view not recorded (1 of 2)]
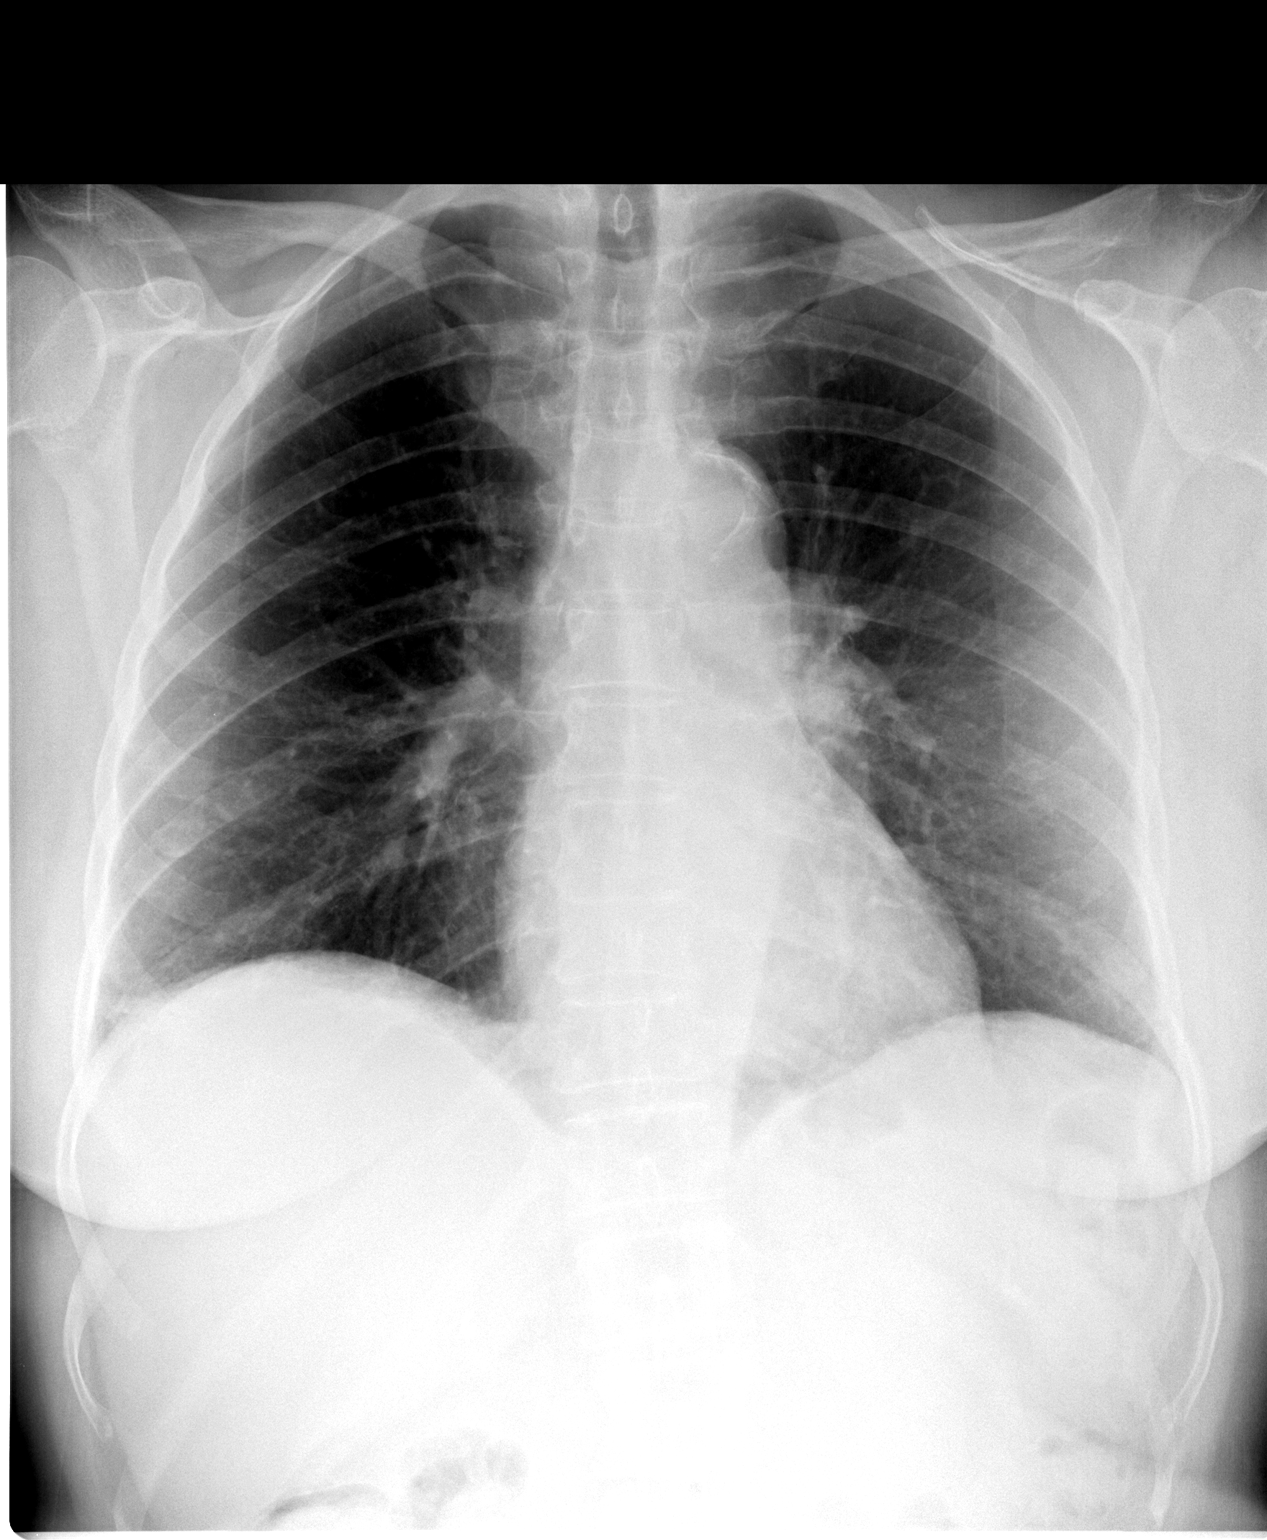

[view not recorded (2 of 2)]
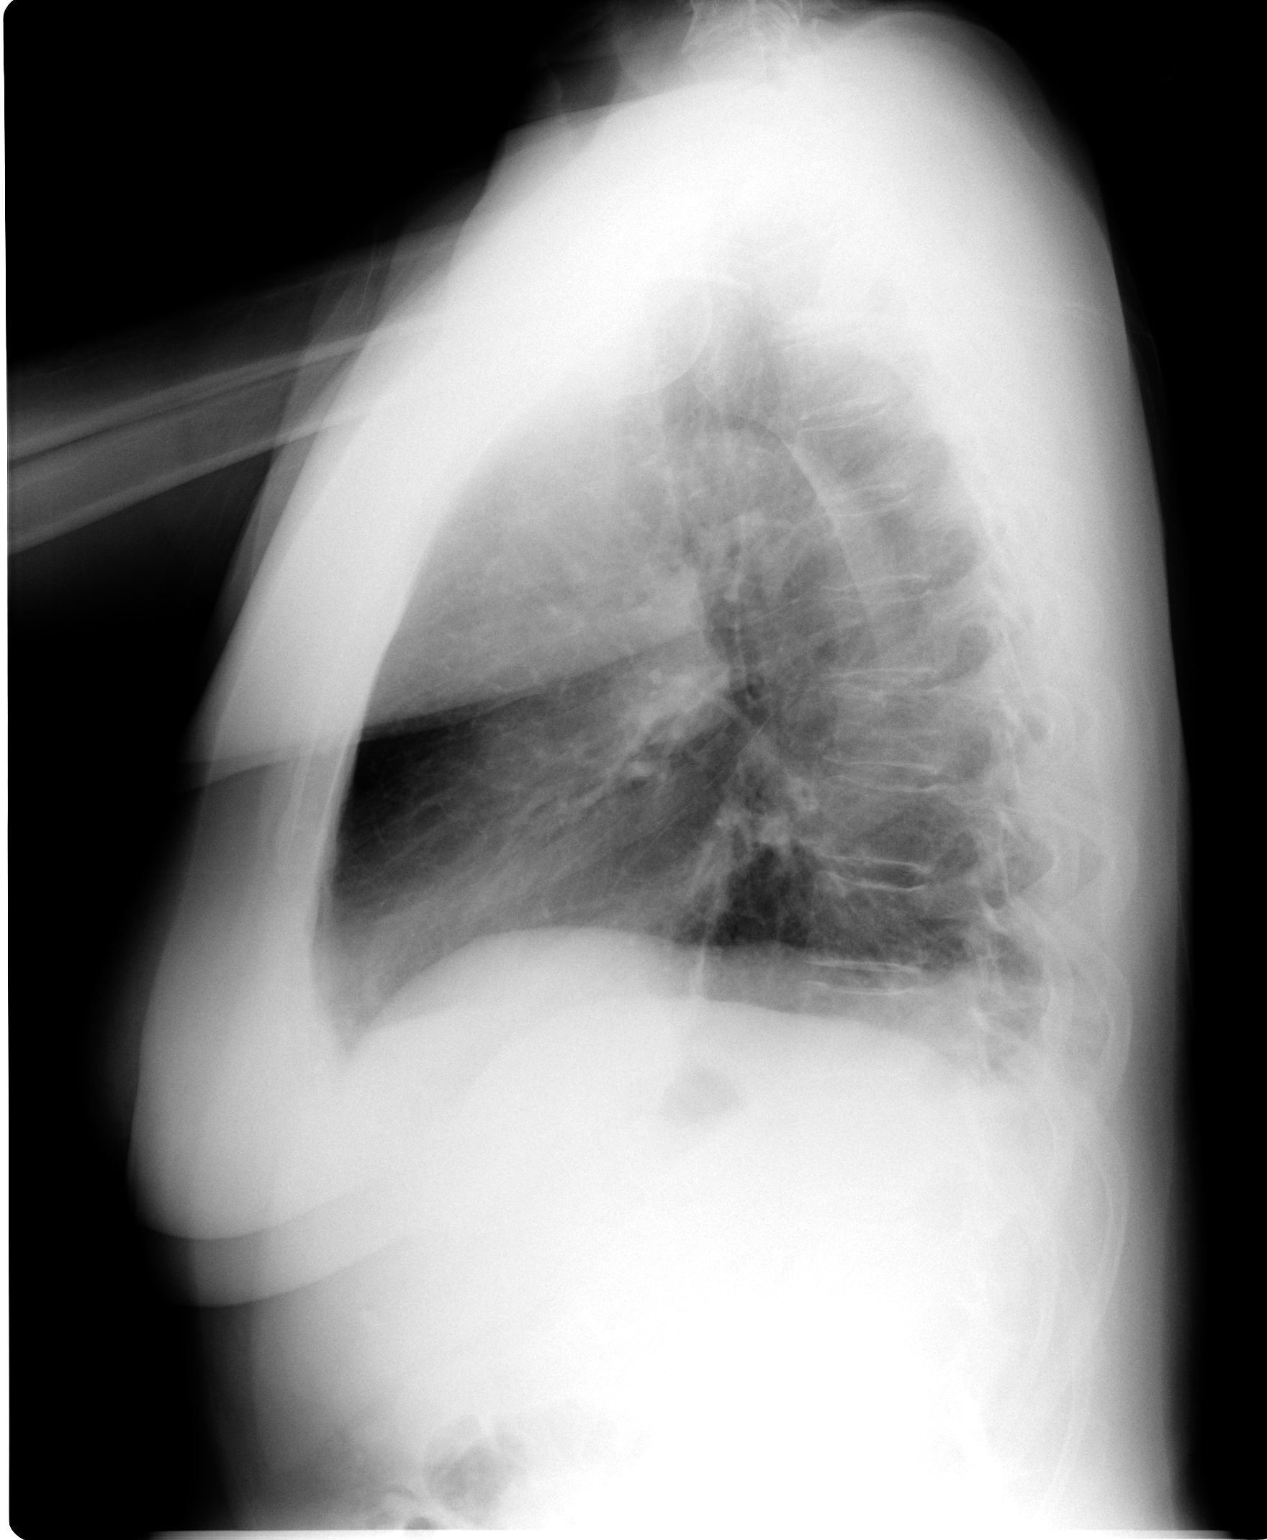

[2 of 2 positions shown; findings below may reference images not displayed]

FINDINGS: Mild patchy bibasilar airspace disease has developed
since the prior study.  This may be due to atelectasis or
pneumonia.  There is no heart failure or edema.  Small pleural
effusion on the right.
IMPRESSION: Mild bibasilar airspace disease and small pleural effusion.  This
could be due to pneumonia or atelectasis.

## 2010-06-10 LAB — URINE CULTURE
Colony Count: >=100000
Culture  Setup Time: 201110060030
Special Requests: POSITIVE

## 2010-06-10 LAB — URINALYSIS, MICROSCOPIC ONLY
Bilirubin Urine: NEGATIVE
Glucose, UA: NEGATIVE mg/dL
Ketones, ur: NEGATIVE mg/dL
Nitrite: NEGATIVE
Protein, ur: NEGATIVE mg/dL
Specific Gravity, Urine: 1.007 (ref 1.005–1.030)
Urobilinogen, UA: 0.2 mg/dL (ref 0.0–1.0)
pH: 6 (ref 5.0–8.0)

## 2010-06-10 LAB — CARDIAC PANEL(CRET KIN+CKTOT+MB+TROPI)
CK, MB: 1.2 ng/mL (ref 0.3–4.0)
CK, MB: 1.2 ng/mL (ref 0.3–4.0)
CK, MB: 1.5 ng/mL (ref 0.3–4.0)
Relative Index: INVALID (ref 0.0–2.5)
Relative Index: INVALID (ref 0.0–2.5)
Relative Index: INVALID (ref 0.0–2.5)
Total CK: 64 U/L (ref 7–177)
Total CK: 66 U/L (ref 7–177)
Total CK: 68 U/L (ref 7–177)
Troponin I: 0.01 ng/mL (ref 0.00–0.06)
Troponin I: 0.01 ng/mL (ref 0.00–0.06)
Troponin I: 0.01 ng/mL (ref 0.00–0.06)

## 2010-06-10 LAB — BASIC METABOLIC PANEL
BUN: 22 mg/dL (ref 6–23)
CO2: 29 mEq/L (ref 19–32)
Calcium: 9.6 mg/dL (ref 8.4–10.5)
Chloride: 98 mEq/L (ref 96–112)
Creatinine, Ser: 1.17 mg/dL (ref 0.4–1.2)
GFR calc Af Amer: 55 mL/min — ABNORMAL LOW (ref >60)
GFR calc non Af Amer: 46 mL/min — ABNORMAL LOW (ref >60)
Glucose, Bld: 184 mg/dL — ABNORMAL HIGH (ref 70–99)
Potassium: 3.8 mEq/L (ref 3.5–5.1)
Sodium: 135 mEq/L (ref 135–145)

## 2010-06-10 LAB — HEPATIC FUNCTION PANEL
ALT: 85 U/L — ABNORMAL HIGH (ref 0–35)
AST: 106 U/L — ABNORMAL HIGH (ref 0–37)
Albumin: 3.7 g/dL (ref 3.5–5.2)
Alkaline Phosphatase: 99 U/L (ref 39–117)
Bilirubin, Direct: <0.1 mg/dL (ref 0.0–0.3)
Total Bilirubin: 0.4 mg/dL (ref 0.3–1.2)
Total Protein: 6.6 g/dL (ref 6.0–8.3)

## 2010-06-10 LAB — GLUCOSE, CAPILLARY
Glucose-Capillary: 106 mg/dL — ABNORMAL HIGH (ref 70–99)
Glucose-Capillary: 109 mg/dL — ABNORMAL HIGH (ref 70–99)
Glucose-Capillary: 109 mg/dL — ABNORMAL HIGH (ref 70–99)
Glucose-Capillary: 120 mg/dL — ABNORMAL HIGH (ref 70–99)
Glucose-Capillary: 132 mg/dL — ABNORMAL HIGH (ref 70–99)
Glucose-Capillary: 153 mg/dL — ABNORMAL HIGH (ref 70–99)
Glucose-Capillary: 98 mg/dL (ref 70–99)
Glucose-Capillary: 99 mg/dL (ref 70–99)

## 2010-06-10 LAB — CBC
HCT: 37.9 % (ref 36.0–46.0)
Hemoglobin: 12.6 g/dL (ref 12.0–15.0)
MCH: 29.8 pg (ref 26.0–34.0)
MCHC: 33.2 g/dL (ref 30.0–36.0)
MCV: 89.6 fL (ref 78.0–100.0)
Platelets: 222 10*3/uL (ref 150–400)
RBC: 4.23 MIL/uL (ref 3.87–5.11)
RDW: 13.4 % (ref 11.5–15.5)
WBC: 7.2 10*3/uL (ref 4.0–10.5)

## 2010-06-10 LAB — LIPID PANEL
Cholesterol: 148 mg/dL (ref 0–200)
HDL: 74 mg/dL (ref >39)
LDL Cholesterol: 47 mg/dL (ref 0–99)
Total CHOL/HDL Ratio: 2 RATIO
Triglycerides: 134 mg/dL (ref <150)
VLDL: 27 mg/dL (ref 0–40)

## 2010-06-10 LAB — BRAIN NATRIURETIC PEPTIDE: Pro B Natriuretic peptide (BNP): <30 pg/mL (ref 0.0–100.0)

## 2010-06-10 LAB — TSH: TSH: 2.135 u[IU]/mL (ref 0.350–4.500)

## 2010-06-10 LAB — MAGNESIUM: Magnesium: 1.7 mg/dL (ref 1.5–2.5)

## 2010-06-10 LAB — HEMOGLOBIN A1C
Hgb A1c MFr Bld: 5.9 % — ABNORMAL HIGH (ref <5.7)
Mean Plasma Glucose: 123 mg/dL — ABNORMAL HIGH (ref <117)

## 2010-06-10 LAB — T4, FREE: Free T4: 1 ng/dL (ref 0.80–1.80)

## 2010-06-10 LAB — PHOSPHORUS: Phosphorus: 3.3 mg/dL (ref 2.3–4.6)

## 2010-06-11 LAB — GLUCOSE, CAPILLARY
Glucose-Capillary: 108 mg/dL — ABNORMAL HIGH (ref 70–99)
Glucose-Capillary: 100 mg/dL — ABNORMAL HIGH (ref 70–99)
Glucose-Capillary: 105 mg/dL — ABNORMAL HIGH (ref 70–99)
Glucose-Capillary: 97 mg/dL (ref 70–99)
Glucose-Capillary: 99 mg/dL (ref 70–99)

## 2010-06-11 LAB — DIFFERENTIAL
Basophils Absolute: 0 10*3/uL (ref 0.0–0.1)
Basophils Relative: 1 % (ref 0–1)
Eosinophils Absolute: 0.2 10*3/uL (ref 0.0–0.7)
Eosinophils Relative: 2 % (ref 0–5)
Lymphocytes Relative: 27 % (ref 12–46)
Lymphs Abs: 2.3 10*3/uL (ref 0.7–4.0)
Monocytes Absolute: 0.7 10*3/uL (ref 0.1–1.0)
Monocytes Relative: 9 % (ref 3–12)
Neutro Abs: 5.4 10*3/uL (ref 1.7–7.7)
Neutrophils Relative %: 62 % (ref 43–77)

## 2010-06-11 LAB — BASIC METABOLIC PANEL
BUN: 14 mg/dL (ref 6–23)
CO2: 27 mEq/L (ref 19–32)
Calcium: 9.5 mg/dL (ref 8.4–10.5)
Chloride: 97 mEq/L (ref 96–112)
Creatinine, Ser: 1.03 mg/dL (ref 0.4–1.2)
GFR calc Af Amer: >60 mL/min (ref >60)
GFR calc non Af Amer: 53 mL/min — ABNORMAL LOW (ref >60)
Glucose, Bld: 106 mg/dL — ABNORMAL HIGH (ref 70–99)
Potassium: 4 mEq/L (ref 3.5–5.1)
Sodium: 131 mEq/L — ABNORMAL LOW (ref 135–145)

## 2010-06-11 LAB — CBC
HCT: 34.2 % — ABNORMAL LOW (ref 36.0–46.0)
Hemoglobin: 11.7 g/dL — ABNORMAL LOW (ref 12.0–15.0)
MCH: 29.8 pg (ref 26.0–34.0)
MCHC: 34.2 g/dL (ref 30.0–36.0)
MCV: 87 fL (ref 78.0–100.0)
Platelets: 233 10*3/uL (ref 150–400)
RBC: 3.93 MIL/uL (ref 3.87–5.11)
RDW: 13.4 % (ref 11.5–15.5)
WBC: 8.7 10*3/uL (ref 4.0–10.5)

## 2010-06-14 LAB — COMPREHENSIVE METABOLIC PANEL
ALT: 13 U/L (ref 0–35)
AST: 19 U/L (ref 0–37)
Albumin: 3.6 g/dL (ref 3.5–5.2)
Alkaline Phosphatase: 80 U/L (ref 39–117)
BUN: 22 mg/dL (ref 6–23)
CO2: 31 mEq/L (ref 19–32)
Calcium: 9.6 mg/dL (ref 8.4–10.5)
Chloride: 104 mEq/L (ref 96–112)
Creatinine, Ser: 1.16 mg/dL (ref 0.4–1.2)
GFR calc Af Amer: 56 mL/min — ABNORMAL LOW (ref >60)
GFR calc non Af Amer: 46 mL/min — ABNORMAL LOW (ref >60)
Glucose, Bld: 99 mg/dL (ref 70–99)
Potassium: 4.3 mEq/L (ref 3.5–5.1)
Sodium: 141 mEq/L (ref 135–145)
Total Bilirubin: 0.4 mg/dL (ref 0.3–1.2)
Total Protein: 6.8 g/dL (ref 6.0–8.3)

## 2010-06-14 LAB — DIFFERENTIAL
Basophils Absolute: 0 10*3/uL (ref 0.0–0.1)
Basophils Relative: 0 % (ref 0–1)
Eosinophils Absolute: 0.1 10*3/uL (ref 0.0–0.7)
Eosinophils Relative: 2 % (ref 0–5)
Lymphocytes Relative: 27 % (ref 12–46)
Lymphs Abs: 1.6 10*3/uL (ref 0.7–4.0)
Monocytes Absolute: 0.5 10*3/uL (ref 0.1–1.0)
Monocytes Relative: 8 % (ref 3–12)
Neutro Abs: 3.6 10*3/uL (ref 1.7–7.7)
Neutrophils Relative %: 63 % (ref 43–77)

## 2010-06-14 LAB — GLUCOSE, CAPILLARY
Glucose-Capillary: 106 mg/dL — ABNORMAL HIGH (ref 70–99)
Glucose-Capillary: 107 mg/dL — ABNORMAL HIGH (ref 70–99)
Glucose-Capillary: 115 mg/dL — ABNORMAL HIGH (ref 70–99)
Glucose-Capillary: 120 mg/dL — ABNORMAL HIGH (ref 70–99)
Glucose-Capillary: 96 mg/dL (ref 70–99)
Glucose-Capillary: 113 mg/dL — ABNORMAL HIGH (ref 70–99)
Glucose-Capillary: 121 mg/dL — ABNORMAL HIGH (ref 70–99)
Glucose-Capillary: 326 mg/dL — ABNORMAL HIGH (ref 70–99)

## 2010-06-14 LAB — TROPONIN I
Troponin I: 0.01 ng/mL (ref 0.00–0.06)
Troponin I: 0.02 ng/mL (ref 0.00–0.06)
Troponin I: 0.05 ng/mL (ref 0.00–0.06)

## 2010-06-14 LAB — BASIC METABOLIC PANEL
BUN: 19 mg/dL (ref 6–23)
CO2: 28 mEq/L (ref 19–32)
Calcium: 9 mg/dL (ref 8.4–10.5)
Chloride: 104 mEq/L (ref 96–112)
Creatinine, Ser: 1.1 mg/dL (ref 0.4–1.2)
GFR calc Af Amer: 59 mL/min — ABNORMAL LOW (ref >60)
GFR calc non Af Amer: 49 mL/min — ABNORMAL LOW (ref >60)
Glucose, Bld: 110 mg/dL — ABNORMAL HIGH (ref 70–99)
Potassium: 4.5 mEq/L (ref 3.5–5.1)
Sodium: 137 mEq/L (ref 135–145)

## 2010-06-14 LAB — CK TOTAL AND CKMB (NOT AT ARMC)
CK, MB: 0.9 ng/mL (ref 0.3–4.0)
CK, MB: 1.1 ng/mL (ref 0.3–4.0)
CK, MB: 1.4 ng/mL (ref 0.3–4.0)
Relative Index: INVALID (ref 0.0–2.5)
Relative Index: INVALID (ref 0.0–2.5)
Relative Index: INVALID (ref 0.0–2.5)
Total CK: 56 U/L (ref 7–177)
Total CK: 69 U/L (ref 7–177)
Total CK: 81 U/L (ref 7–177)

## 2010-06-14 LAB — URINALYSIS, ROUTINE W REFLEX MICROSCOPIC
Bilirubin Urine: NEGATIVE
Glucose, UA: NEGATIVE mg/dL
Hgb urine dipstick: NEGATIVE
Ketones, ur: NEGATIVE mg/dL
Nitrite: NEGATIVE
Protein, ur: NEGATIVE mg/dL
Specific Gravity, Urine: 1.006 (ref 1.005–1.030)
Urobilinogen, UA: 0.2 mg/dL (ref 0.0–1.0)
pH: 6 (ref 5.0–8.0)

## 2010-06-14 LAB — CBC
HCT: 34.3 % — ABNORMAL LOW (ref 36.0–46.0)
HCT: 36.3 % (ref 36.0–46.0)
Hemoglobin: 11.7 g/dL — ABNORMAL LOW (ref 12.0–15.0)
Hemoglobin: 12.3 g/dL (ref 12.0–15.0)
MCH: 30.9 pg (ref 26.0–34.0)
MCH: 31.3 pg (ref 26.0–34.0)
MCHC: 33.9 g/dL (ref 30.0–36.0)
MCHC: 34.2 g/dL (ref 30.0–36.0)
MCV: 91.3 fL (ref 78.0–100.0)
MCV: 91.4 fL (ref 78.0–100.0)
Platelets: 161 10*3/uL (ref 150–400)
Platelets: 177 10*3/uL (ref 150–400)
RBC: 3.75 MIL/uL — ABNORMAL LOW (ref 3.87–5.11)
RBC: 3.98 MIL/uL (ref 3.87–5.11)
RDW: 13.9 % (ref 11.5–15.5)
RDW: 14.1 % (ref 11.5–15.5)
WBC: 4.7 10*3/uL (ref 4.0–10.5)
WBC: 5.8 10*3/uL (ref 4.0–10.5)

## 2010-06-14 LAB — BRAIN NATRIURETIC PEPTIDE: Pro B Natriuretic peptide (BNP): 73 pg/mL (ref 0.0–100.0)

## 2010-06-14 LAB — POCT I-STAT, CHEM 8
BUN: 31 mg/dL — ABNORMAL HIGH (ref 6–23)
Calcium, Ion: 1.15 mmol/L (ref 1.12–1.32)
Chloride: 102 mEq/L (ref 96–112)
Creatinine, Ser: 1.4 mg/dL — ABNORMAL HIGH (ref 0.4–1.2)
Glucose, Bld: 100 mg/dL — ABNORMAL HIGH (ref 70–99)
HCT: 38 % (ref 36.0–46.0)
Hemoglobin: 12.9 g/dL (ref 12.0–15.0)
Potassium: 4.3 mEq/L (ref 3.5–5.1)
Sodium: 132 mEq/L — ABNORMAL LOW (ref 135–145)
TCO2: 24 mmol/L (ref 0–100)

## 2010-06-14 LAB — HEMOGLOBIN A1C
Hgb A1c MFr Bld: 5.8 % — ABNORMAL HIGH (ref <5.7)
Mean Plasma Glucose: 120 mg/dL — ABNORMAL HIGH (ref <117)

## 2010-06-14 LAB — POCT CARDIAC MARKERS
CKMB, poc: <1 ng/mL — ABNORMAL LOW (ref 1.0–8.0)
Myoglobin, poc: 95.4 ng/mL (ref 12–200)
Troponin i, poc: <0.05 ng/mL (ref 0.00–0.09)

## 2010-06-14 LAB — PROTIME-INR
INR: 0.96 (ref 0.00–1.49)
Prothrombin Time: 12.7 seconds (ref 11.6–15.2)

## 2010-06-14 LAB — D-DIMER, QUANTITATIVE: D-Dimer, Quant: 0.46 ug/mL-FEU (ref 0.00–0.48)

## 2010-06-14 LAB — FOLATE: Folate: >20 ng/mL

## 2010-06-14 LAB — MAGNESIUM: Magnesium: 1.8 mg/dL (ref 1.5–2.5)

## 2010-06-14 LAB — VITAMIN B12: Vitamin B-12: 617 pg/mL (ref 211–911)

## 2010-06-14 LAB — APTT: aPTT: 30 seconds (ref 24–37)

## 2010-06-14 LAB — TSH: TSH: 1.332 u[IU]/mL (ref 0.350–4.500)

## 2010-06-17 LAB — CARDIAC PANEL(CRET KIN+CKTOT+MB+TROPI)
CK, MB: 1.4 ng/mL (ref 0.3–4.0)
CK, MB: 1.5 ng/mL (ref 0.3–4.0)
Relative Index: 0.9 (ref 0.0–2.5)
Relative Index: 1.1 (ref 0.0–2.5)
Total CK: 133 U/L (ref 7–177)
Total CK: 160 U/L (ref 7–177)
Troponin I: 0.01 ng/mL (ref 0.00–0.06)
Troponin I: 0.02 ng/mL (ref 0.00–0.06)

## 2010-06-17 LAB — COMPREHENSIVE METABOLIC PANEL
ALT: 61 U/L — ABNORMAL HIGH (ref 0–35)
AST: 125 U/L — ABNORMAL HIGH (ref 0–37)
Albumin: 3.1 g/dL — ABNORMAL LOW (ref 3.5–5.2)
Alkaline Phosphatase: 97 U/L (ref 39–117)
BUN: 11 mg/dL (ref 6–23)
CO2: 29 mEq/L (ref 19–32)
Calcium: 8.6 mg/dL (ref 8.4–10.5)
Chloride: 103 mEq/L (ref 96–112)
Creatinine, Ser: 1.04 mg/dL (ref 0.4–1.2)
GFR calc Af Amer: >60 mL/min (ref >60)
GFR calc non Af Amer: 52 mL/min — ABNORMAL LOW (ref >60)
Glucose, Bld: 95 mg/dL (ref 70–99)
Potassium: 4 mEq/L (ref 3.5–5.1)
Sodium: 138 mEq/L (ref 135–145)
Total Bilirubin: 0.6 mg/dL (ref 0.3–1.2)
Total Protein: 5.8 g/dL — ABNORMAL LOW (ref 6.0–8.3)

## 2010-06-17 LAB — CBC
HCT: 31 % — ABNORMAL LOW (ref 36.0–46.0)
HCT: 31.8 % — ABNORMAL LOW (ref 36.0–46.0)
HCT: 34.5 % — ABNORMAL LOW (ref 36.0–46.0)
Hemoglobin: 10.5 g/dL — ABNORMAL LOW (ref 12.0–15.0)
Hemoglobin: 11 g/dL — ABNORMAL LOW (ref 12.0–15.0)
Hemoglobin: 11.8 g/dL — ABNORMAL LOW (ref 12.0–15.0)
MCHC: 33.9 g/dL (ref 30.0–36.0)
MCHC: 34.3 g/dL (ref 30.0–36.0)
MCHC: 34.5 g/dL (ref 30.0–36.0)
MCV: 87.8 fL (ref 78.0–100.0)
MCV: 88.4 fL (ref 78.0–100.0)
MCV: 88.7 fL (ref 78.0–100.0)
Platelets: 148 10*3/uL — ABNORMAL LOW (ref 150–400)
Platelets: 156 10*3/uL (ref 150–400)
Platelets: 172 10*3/uL (ref 150–400)
RBC: 3.51 MIL/uL — ABNORMAL LOW (ref 3.87–5.11)
RBC: 3.62 MIL/uL — ABNORMAL LOW (ref 3.87–5.11)
RBC: 3.89 MIL/uL (ref 3.87–5.11)
RDW: 14.8 % (ref 11.5–15.5)
RDW: 15.2 % (ref 11.5–15.5)
RDW: 15.6 % — ABNORMAL HIGH (ref 11.5–15.5)
WBC: 4.4 10*3/uL (ref 4.0–10.5)
WBC: 6.7 10*3/uL (ref 4.0–10.5)
WBC: 7.9 10*3/uL (ref 4.0–10.5)

## 2010-06-17 LAB — URINALYSIS, ROUTINE W REFLEX MICROSCOPIC
Bilirubin Urine: NEGATIVE
Glucose, UA: NEGATIVE mg/dL
Ketones, ur: NEGATIVE mg/dL
Leukocytes, UA: NEGATIVE
Nitrite: NEGATIVE
Protein, ur: NEGATIVE mg/dL
Specific Gravity, Urine: 1.02 (ref 1.005–1.030)
Urobilinogen, UA: 0.2 mg/dL (ref 0.0–1.0)
pH: 6 (ref 5.0–8.0)

## 2010-06-17 LAB — DIFFERENTIAL
Basophils Absolute: 0 10*3/uL (ref 0.0–0.1)
Basophils Relative: 0 % (ref 0–1)
Eosinophils Absolute: 0.2 10*3/uL (ref 0.0–0.7)
Eosinophils Relative: 2 % (ref 0–5)
Lymphocytes Relative: 29 % (ref 12–46)
Lymphs Abs: 2.3 10*3/uL (ref 0.7–4.0)
Monocytes Absolute: 0.6 10*3/uL (ref 0.1–1.0)
Monocytes Relative: 8 % (ref 3–12)
Neutro Abs: 4.8 10*3/uL (ref 1.7–7.7)
Neutrophils Relative %: 61 % (ref 43–77)

## 2010-06-17 LAB — MRSA PCR SCREENING: MRSA by PCR: NEGATIVE

## 2010-06-17 LAB — AMYLASE: Amylase: 39 U/L (ref 0–105)

## 2010-06-17 LAB — LIPID PANEL
Cholesterol: 138 mg/dL (ref 0–200)
HDL: 65 mg/dL (ref >39)
LDL Cholesterol: 53 mg/dL (ref 0–99)
Total CHOL/HDL Ratio: 2.1 RATIO
Triglycerides: 101 mg/dL (ref <150)
VLDL: 20 mg/dL (ref 0–40)

## 2010-06-17 LAB — GLUCOSE, CAPILLARY
Glucose-Capillary: 103 mg/dL — ABNORMAL HIGH (ref 70–99)
Glucose-Capillary: 109 mg/dL — ABNORMAL HIGH (ref 70–99)
Glucose-Capillary: 117 mg/dL — ABNORMAL HIGH (ref 70–99)
Glucose-Capillary: 119 mg/dL — ABNORMAL HIGH (ref 70–99)
Glucose-Capillary: 92 mg/dL (ref 70–99)
Glucose-Capillary: 99 mg/dL (ref 70–99)

## 2010-06-17 LAB — URINE CULTURE
Colony Count: NO GROWTH
Culture: NO GROWTH
Special Requests: NEGATIVE

## 2010-06-17 LAB — POCT CARDIAC MARKERS
CKMB, poc: 1.1 ng/mL (ref 1.0–8.0)
Myoglobin, poc: 93 ng/mL (ref 12–200)
Troponin i, poc: <0.05 ng/mL (ref 0.00–0.09)

## 2010-06-17 LAB — BASIC METABOLIC PANEL
BUN: 10 mg/dL (ref 6–23)
CO2: 27 mEq/L (ref 19–32)
Calcium: 8.6 mg/dL (ref 8.4–10.5)
Chloride: 104 mEq/L (ref 96–112)
Creatinine, Ser: 0.96 mg/dL (ref 0.4–1.2)
GFR calc Af Amer: >60 mL/min (ref >60)
GFR calc non Af Amer: 57 mL/min — ABNORMAL LOW (ref >60)
Glucose, Bld: 106 mg/dL — ABNORMAL HIGH (ref 70–99)
Potassium: 3.8 mEq/L (ref 3.5–5.1)
Sodium: 136 mEq/L (ref 135–145)

## 2010-06-17 LAB — PROTIME-INR
INR: 1.04 (ref 0.00–1.49)
Prothrombin Time: 13.5 seconds (ref 11.6–15.2)

## 2010-06-17 LAB — HEPARIN LEVEL (UNFRACTIONATED): Heparin Unfractionated: 0.56 IU/mL (ref 0.30–0.70)

## 2010-06-17 LAB — TSH: TSH: 4.583 u[IU]/mL — ABNORMAL HIGH (ref 0.350–4.500)

## 2010-06-17 LAB — POCT I-STAT, CHEM 8
BUN: 14 mg/dL (ref 6–23)
Calcium, Ion: 1.19 mmol/L (ref 1.12–1.32)
Chloride: 101 mEq/L (ref 96–112)
Creatinine, Ser: 1.1 mg/dL (ref 0.4–1.2)
Glucose, Bld: 95 mg/dL (ref 70–99)
HCT: 36 % (ref 36.0–46.0)
Hemoglobin: 12.2 g/dL (ref 12.0–15.0)
Potassium: 4.1 mEq/L (ref 3.5–5.1)
Sodium: 137 mEq/L (ref 135–145)
TCO2: 29 mmol/L (ref 0–100)

## 2010-06-17 LAB — CK TOTAL AND CKMB (NOT AT ARMC)
CK, MB: 2 ng/mL (ref 0.3–4.0)
Relative Index: 0.9 (ref 0.0–2.5)
Total CK: 217 U/L — ABNORMAL HIGH (ref 7–177)

## 2010-06-17 LAB — TROPONIN I: Troponin I: 0.01 ng/mL (ref 0.00–0.06)

## 2010-06-17 LAB — LIPASE, BLOOD: Lipase: 28 U/L (ref 11–59)

## 2010-06-17 LAB — APTT: aPTT: 22 seconds — ABNORMAL LOW (ref 24–37)

## 2010-06-17 LAB — URINE MICROSCOPIC-ADD ON

## 2010-06-19 LAB — CBC
HCT: 36.3 % (ref 36.0–46.0)
HCT: 36.6 % (ref 36.0–46.0)
HCT: 41.7 % (ref 36.0–46.0)
Hemoglobin: 12.4 g/dL (ref 12.0–15.0)
Hemoglobin: 12.4 g/dL (ref 12.0–15.0)
Hemoglobin: 14 g/dL (ref 12.0–15.0)
MCHC: 33.6 g/dL (ref 30.0–36.0)
MCHC: 33.8 g/dL (ref 30.0–36.0)
MCHC: 34.1 g/dL (ref 30.0–36.0)
MCV: 87.7 fL (ref 78.0–100.0)
MCV: 87.7 fL (ref 78.0–100.0)
MCV: 88.6 fL (ref 78.0–100.0)
Platelets: 150 10*3/uL (ref 150–400)
Platelets: 160 10*3/uL (ref 150–400)
Platelets: 223 10*3/uL (ref 150–400)
RBC: 4.13 MIL/uL (ref 3.87–5.11)
RBC: 4.14 MIL/uL (ref 3.87–5.11)
RBC: 4.76 MIL/uL (ref 3.87–5.11)
RDW: 14.5 % (ref 11.5–15.5)
RDW: 14.7 % (ref 11.5–15.5)
RDW: 14.9 % (ref 11.5–15.5)
WBC: 5.4 10*3/uL (ref 4.0–10.5)
WBC: 6.3 10*3/uL (ref 4.0–10.5)
WBC: 8.1 10*3/uL (ref 4.0–10.5)

## 2010-06-19 LAB — GLUCOSE, CAPILLARY
Glucose-Capillary: 100 mg/dL — ABNORMAL HIGH (ref 70–99)
Glucose-Capillary: 109 mg/dL — ABNORMAL HIGH (ref 70–99)
Glucose-Capillary: 113 mg/dL — ABNORMAL HIGH (ref 70–99)
Glucose-Capillary: 113 mg/dL — ABNORMAL HIGH (ref 70–99)
Glucose-Capillary: 114 mg/dL — ABNORMAL HIGH (ref 70–99)
Glucose-Capillary: 116 mg/dL — ABNORMAL HIGH (ref 70–99)
Glucose-Capillary: 119 mg/dL — ABNORMAL HIGH (ref 70–99)
Glucose-Capillary: 120 mg/dL — ABNORMAL HIGH (ref 70–99)
Glucose-Capillary: 124 mg/dL — ABNORMAL HIGH (ref 70–99)
Glucose-Capillary: 127 mg/dL — ABNORMAL HIGH (ref 70–99)

## 2010-06-19 LAB — COMPREHENSIVE METABOLIC PANEL
ALT: 18 U/L (ref 0–35)
AST: 23 U/L (ref 0–37)
Albumin: 3.5 g/dL (ref 3.5–5.2)
Alkaline Phosphatase: 90 U/L (ref 39–117)
BUN: 18 mg/dL (ref 6–23)
CO2: 31 mEq/L (ref 19–32)
Calcium: 9.6 mg/dL (ref 8.4–10.5)
Chloride: 100 mEq/L (ref 96–112)
Creatinine, Ser: 0.84 mg/dL (ref 0.4–1.2)
GFR calc Af Amer: >60 mL/min (ref >60)
GFR calc non Af Amer: >60 mL/min (ref >60)
Glucose, Bld: 87 mg/dL (ref 70–99)
Potassium: 3.8 mEq/L (ref 3.5–5.1)
Sodium: 137 mEq/L (ref 135–145)
Total Bilirubin: 0.3 mg/dL (ref 0.3–1.2)
Total Protein: 7.4 g/dL (ref 6.0–8.3)

## 2010-06-19 LAB — BRAIN NATRIURETIC PEPTIDE: Pro B Natriuretic peptide (BNP): <30 pg/mL (ref 0.0–100.0)

## 2010-06-19 LAB — URINALYSIS, ROUTINE W REFLEX MICROSCOPIC
Bilirubin Urine: NEGATIVE
Glucose, UA: NEGATIVE mg/dL
Hgb urine dipstick: NEGATIVE
Ketones, ur: NEGATIVE mg/dL
Nitrite: NEGATIVE
Protein, ur: NEGATIVE mg/dL
Specific Gravity, Urine: 1.018 (ref 1.005–1.030)
Urobilinogen, UA: 0.2 mg/dL (ref 0.0–1.0)
pH: 6.5 (ref 5.0–8.0)

## 2010-06-19 LAB — URINALYSIS, MICROSCOPIC ONLY
Bilirubin Urine: NEGATIVE
Glucose, UA: NEGATIVE mg/dL
Hgb urine dipstick: NEGATIVE
Ketones, ur: NEGATIVE mg/dL
Nitrite: NEGATIVE
Protein, ur: NEGATIVE mg/dL
Specific Gravity, Urine: 1.02 (ref 1.005–1.030)
Urobilinogen, UA: 0.2 mg/dL (ref 0.0–1.0)
pH: 6 (ref 5.0–8.0)

## 2010-06-19 LAB — TROPONIN I
Troponin I: 0.02 ng/mL (ref 0.00–0.06)
Troponin I: <0.01 ng/mL (ref 0.00–0.06)

## 2010-06-19 LAB — CARDIAC PANEL(CRET KIN+CKTOT+MB+TROPI)
CK, MB: 0.7 ng/mL (ref 0.3–4.0)
CK, MB: 0.8 ng/mL (ref 0.3–4.0)
CK, MB: 1.1 ng/mL (ref 0.3–4.0)
Relative Index: INVALID (ref 0.0–2.5)
Relative Index: INVALID (ref 0.0–2.5)
Relative Index: INVALID (ref 0.0–2.5)
Total CK: 52 U/L (ref 7–177)
Total CK: 63 U/L (ref 7–177)
Total CK: 64 U/L (ref 7–177)
Troponin I: 0.01 ng/mL (ref 0.00–0.06)
Troponin I: 0.02 ng/mL (ref 0.00–0.06)
Troponin I: 0.06 ng/mL (ref 0.00–0.06)

## 2010-06-19 LAB — D-DIMER, QUANTITATIVE: D-Dimer, Quant: 0.25 ug/mL-FEU (ref 0.00–0.48)

## 2010-06-19 LAB — BASIC METABOLIC PANEL
BUN: 15 mg/dL (ref 6–23)
BUN: 18 mg/dL (ref 6–23)
BUN: 19 mg/dL (ref 6–23)
CO2: 28 mEq/L (ref 19–32)
CO2: 28 mEq/L (ref 19–32)
CO2: 28 mEq/L (ref 19–32)
Calcium: 8.6 mg/dL (ref 8.4–10.5)
Calcium: 8.8 mg/dL (ref 8.4–10.5)
Calcium: 9 mg/dL (ref 8.4–10.5)
Chloride: 103 mEq/L (ref 96–112)
Chloride: 97 mEq/L (ref 96–112)
Chloride: 99 mEq/L (ref 96–112)
Creatinine, Ser: 0.97 mg/dL (ref 0.4–1.2)
Creatinine, Ser: 1.13 mg/dL (ref 0.4–1.2)
Creatinine, Ser: 1.13 mg/dL (ref 0.4–1.2)
GFR calc Af Amer: 58 mL/min — ABNORMAL LOW (ref >60)
GFR calc Af Amer: 58 mL/min — ABNORMAL LOW (ref >60)
GFR calc Af Amer: >60 mL/min (ref >60)
GFR calc non Af Amer: 48 mL/min — ABNORMAL LOW (ref >60)
GFR calc non Af Amer: 48 mL/min — ABNORMAL LOW (ref >60)
GFR calc non Af Amer: 57 mL/min — ABNORMAL LOW (ref >60)
Glucose, Bld: 106 mg/dL — ABNORMAL HIGH (ref 70–99)
Glucose, Bld: 108 mg/dL — ABNORMAL HIGH (ref 70–99)
Glucose, Bld: 118 mg/dL — ABNORMAL HIGH (ref 70–99)
Potassium: 3.9 mEq/L (ref 3.5–5.1)
Potassium: 4 mEq/L (ref 3.5–5.1)
Potassium: 4.2 mEq/L (ref 3.5–5.1)
Sodium: 136 mEq/L (ref 135–145)
Sodium: 136 mEq/L (ref 135–145)
Sodium: 136 mEq/L (ref 135–145)

## 2010-06-19 LAB — LIPID PANEL
Cholesterol: 262 mg/dL — ABNORMAL HIGH (ref 0–200)
HDL: 49 mg/dL (ref >39)
LDL Cholesterol: UNDETERMINED mg/dL (ref 0–99)
Total CHOL/HDL Ratio: 5.3 RATIO
Triglycerides: 550 mg/dL — ABNORMAL HIGH (ref <150)
VLDL: UNDETERMINED mg/dL (ref 0–40)

## 2010-06-19 LAB — URINE CULTURE
Colony Count: NO GROWTH
Culture: NO GROWTH

## 2010-06-19 LAB — MRSA PCR SCREENING: MRSA by PCR: NEGATIVE

## 2010-06-19 LAB — T4, FREE: Free T4: 0.9 ng/dL (ref 0.80–1.80)

## 2010-06-19 LAB — HEMOGLOBIN A1C
Hgb A1c MFr Bld: 6.2 % — ABNORMAL HIGH (ref 4.6–6.1)
Mean Plasma Glucose: 131 mg/dL

## 2010-06-19 LAB — CK TOTAL AND CKMB (NOT AT ARMC)
CK, MB: 0.8 ng/mL (ref 0.3–4.0)
CK, MB: 0.9 ng/mL (ref 0.3–4.0)
Relative Index: INVALID (ref 0.0–2.5)
Relative Index: INVALID (ref 0.0–2.5)
Total CK: 64 U/L (ref 7–177)
Total CK: 67 U/L (ref 7–177)

## 2010-06-19 LAB — PROTIME-INR
INR: 0.94 (ref 0.00–1.49)
Prothrombin Time: 12.5 seconds (ref 11.6–15.2)

## 2010-06-19 LAB — MAGNESIUM: Magnesium: 2.1 mg/dL (ref 1.5–2.5)

## 2010-06-19 LAB — LIPASE, BLOOD: Lipase: 17 U/L (ref 11–59)

## 2010-06-19 LAB — TSH: TSH: 2.006 u[IU]/mL (ref 0.350–4.500)

## 2010-06-19 LAB — HEPARIN LEVEL (UNFRACTIONATED): Heparin Unfractionated: <0.1 IU/mL — ABNORMAL LOW (ref 0.30–0.70)

## 2010-06-30 LAB — GLUCOSE, CAPILLARY
Glucose-Capillary: 104 mg/dL — ABNORMAL HIGH (ref 70–99)
Glucose-Capillary: 107 mg/dL — ABNORMAL HIGH (ref 70–99)
Glucose-Capillary: 107 mg/dL — ABNORMAL HIGH (ref 70–99)
Glucose-Capillary: 107 mg/dL — ABNORMAL HIGH (ref 70–99)
Glucose-Capillary: 108 mg/dL — ABNORMAL HIGH (ref 70–99)
Glucose-Capillary: 108 mg/dL — ABNORMAL HIGH (ref 70–99)
Glucose-Capillary: 109 mg/dL — ABNORMAL HIGH (ref 70–99)
Glucose-Capillary: 111 mg/dL — ABNORMAL HIGH (ref 70–99)
Glucose-Capillary: 112 mg/dL — ABNORMAL HIGH (ref 70–99)
Glucose-Capillary: 113 mg/dL — ABNORMAL HIGH (ref 70–99)
Glucose-Capillary: 123 mg/dL — ABNORMAL HIGH (ref 70–99)
Glucose-Capillary: 129 mg/dL — ABNORMAL HIGH (ref 70–99)
Glucose-Capillary: 136 mg/dL — ABNORMAL HIGH (ref 70–99)
Glucose-Capillary: 142 mg/dL — ABNORMAL HIGH (ref 70–99)
Glucose-Capillary: 63 mg/dL — ABNORMAL LOW (ref 70–99)
Glucose-Capillary: 74 mg/dL (ref 70–99)
Glucose-Capillary: 85 mg/dL (ref 70–99)
Glucose-Capillary: 95 mg/dL (ref 70–99)
Glucose-Capillary: 96 mg/dL (ref 70–99)

## 2010-06-30 LAB — CBC
HCT: 28.7 % — ABNORMAL LOW (ref 36.0–46.0)
HCT: 32.5 % — ABNORMAL LOW (ref 36.0–46.0)
Hemoglobin: 11 g/dL — ABNORMAL LOW (ref 12.0–15.0)
Hemoglobin: 9.6 g/dL — ABNORMAL LOW (ref 12.0–15.0)
MCHC: 33.4 g/dL (ref 30.0–36.0)
MCHC: 33.8 g/dL (ref 30.0–36.0)
MCV: 88.9 fL (ref 78.0–100.0)
MCV: 90.3 fL (ref 78.0–100.0)
Platelets: 160 10*3/uL (ref 150–400)
Platelets: 177 10*3/uL (ref 150–400)
RBC: 3.17 MIL/uL — ABNORMAL LOW (ref 3.87–5.11)
RBC: 3.65 MIL/uL — ABNORMAL LOW (ref 3.87–5.11)
RDW: 14 % (ref 11.5–15.5)
RDW: 14.3 % (ref 11.5–15.5)
WBC: 12.4 10*3/uL — ABNORMAL HIGH (ref 4.0–10.5)
WBC: 9.3 10*3/uL (ref 4.0–10.5)

## 2010-06-30 LAB — DIFFERENTIAL
Basophils Absolute: 0 10*3/uL (ref 0.0–0.1)
Basophils Relative: 0 % (ref 0–1)
Eosinophils Absolute: 0 10*3/uL (ref 0.0–0.7)
Eosinophils Relative: 0 % (ref 0–5)
Lymphocytes Relative: 8 % — ABNORMAL LOW (ref 12–46)
Lymphs Abs: 1 10*3/uL (ref 0.7–4.0)
Monocytes Absolute: 0.6 10*3/uL (ref 0.1–1.0)
Monocytes Relative: 5 % (ref 3–12)
Neutro Abs: 10.8 10*3/uL — ABNORMAL HIGH (ref 1.7–7.7)
Neutrophils Relative %: 87 % — ABNORMAL HIGH (ref 43–77)

## 2010-07-01 LAB — COMPREHENSIVE METABOLIC PANEL
ALT: 16 U/L (ref 0–35)
AST: 20 U/L (ref 0–37)
Albumin: 3.6 g/dL (ref 3.5–5.2)
Alkaline Phosphatase: 81 U/L (ref 39–117)
BUN: 13 mg/dL (ref 6–23)
CO2: 32 mEq/L (ref 19–32)
Calcium: 9.5 mg/dL (ref 8.4–10.5)
Chloride: 101 mEq/L (ref 96–112)
Creatinine, Ser: 0.85 mg/dL (ref 0.4–1.2)
GFR calc Af Amer: >60 mL/min (ref >60)
GFR calc non Af Amer: >60 mL/min (ref >60)
Glucose, Bld: 88 mg/dL (ref 70–99)
Potassium: 4.8 mEq/L (ref 3.5–5.1)
Sodium: 138 mEq/L (ref 135–145)
Total Bilirubin: 0.3 mg/dL (ref 0.3–1.2)
Total Protein: 6.8 g/dL (ref 6.0–8.3)

## 2010-07-05 LAB — PROTIME-INR
INR: 1 (ref 0.00–1.49)
Prothrombin Time: 12.9 seconds (ref 11.6–15.2)

## 2010-07-05 LAB — DIFFERENTIAL
Basophils Absolute: 0 10*3/uL (ref 0.0–0.1)
Basophils Relative: 0 % (ref 0–1)
Eosinophils Absolute: 0.3 10*3/uL (ref 0.0–0.7)
Eosinophils Relative: 4 % (ref 0–5)
Lymphocytes Relative: 17 % (ref 12–46)
Lymphs Abs: 1.3 10*3/uL (ref 0.7–4.0)
Monocytes Absolute: 0.5 10*3/uL (ref 0.1–1.0)
Monocytes Relative: 7 % (ref 3–12)
Neutro Abs: 5.2 10*3/uL (ref 1.7–7.7)
Neutrophils Relative %: 72 % (ref 43–77)

## 2010-07-05 LAB — URINALYSIS, ROUTINE W REFLEX MICROSCOPIC
Bilirubin Urine: NEGATIVE
Glucose, UA: NEGATIVE mg/dL
Hgb urine dipstick: NEGATIVE
Ketones, ur: NEGATIVE mg/dL
Nitrite: NEGATIVE
Protein, ur: NEGATIVE mg/dL
Specific Gravity, Urine: 1.009 (ref 1.005–1.030)
Urobilinogen, UA: 0.2 mg/dL (ref 0.0–1.0)
pH: 7.5 (ref 5.0–8.0)

## 2010-07-05 LAB — GLUCOSE, CAPILLARY
Glucose-Capillary: 100 mg/dL — ABNORMAL HIGH (ref 70–99)
Glucose-Capillary: 104 mg/dL — ABNORMAL HIGH (ref 70–99)
Glucose-Capillary: 109 mg/dL — ABNORMAL HIGH (ref 70–99)
Glucose-Capillary: 121 mg/dL — ABNORMAL HIGH (ref 70–99)
Glucose-Capillary: 91 mg/dL (ref 70–99)
Glucose-Capillary: 94 mg/dL (ref 70–99)

## 2010-07-05 LAB — LIPID PANEL
Cholesterol: 220 mg/dL — ABNORMAL HIGH (ref 0–200)
HDL: 76 mg/dL (ref >39)
LDL Cholesterol: 106 mg/dL — ABNORMAL HIGH (ref 0–99)
Total CHOL/HDL Ratio: 2.9 RATIO
Triglycerides: 191 mg/dL — ABNORMAL HIGH (ref <150)
VLDL: 38 mg/dL (ref 0–40)

## 2010-07-05 LAB — URINE CULTURE
Colony Count: NO GROWTH
Culture: NO GROWTH

## 2010-07-05 LAB — CBC
HCT: 40.3 % (ref 36.0–46.0)
Hemoglobin: 13.4 g/dL (ref 12.0–15.0)
MCHC: 33.3 g/dL (ref 30.0–36.0)
MCV: 89.7 fL (ref 78.0–100.0)
Platelets: 191 10*3/uL (ref 150–400)
RBC: 4.5 MIL/uL (ref 3.87–5.11)
RDW: 13.8 % (ref 11.5–15.5)
WBC: 7.3 10*3/uL (ref 4.0–10.5)

## 2010-07-05 LAB — URINE MICROSCOPIC-ADD ON

## 2010-07-05 LAB — CARDIAC PANEL(CRET KIN+CKTOT+MB+TROPI)
CK, MB: 1 ng/mL (ref 0.3–4.0)
CK, MB: 1.2 ng/mL (ref 0.3–4.0)
CK, MB: 1.2 ng/mL (ref 0.3–4.0)
Relative Index: INVALID (ref 0.0–2.5)
Relative Index: INVALID (ref 0.0–2.5)
Relative Index: INVALID (ref 0.0–2.5)
Total CK: 50 U/L (ref 7–177)
Total CK: 54 U/L (ref 7–177)
Total CK: 57 U/L (ref 7–177)
Troponin I: 0.01 ng/mL (ref 0.00–0.06)
Troponin I: 0.01 ng/mL (ref 0.00–0.06)
Troponin I: 0.02 ng/mL (ref 0.00–0.06)

## 2010-07-05 LAB — COMPREHENSIVE METABOLIC PANEL
ALT: 24 U/L (ref 0–35)
AST: 31 U/L (ref 0–37)
Albumin: 3.4 g/dL — ABNORMAL LOW (ref 3.5–5.2)
Alkaline Phosphatase: 89 U/L (ref 39–117)
BUN: 15 mg/dL (ref 6–23)
CO2: 28 mEq/L (ref 19–32)
Calcium: 9 mg/dL (ref 8.4–10.5)
Chloride: 102 mEq/L (ref 96–112)
Creatinine, Ser: 1.1 mg/dL (ref 0.4–1.2)
GFR calc Af Amer: 59 mL/min — ABNORMAL LOW (ref >60)
GFR calc non Af Amer: 49 mL/min — ABNORMAL LOW (ref >60)
Glucose, Bld: 92 mg/dL (ref 70–99)
Potassium: 4 mEq/L (ref 3.5–5.1)
Sodium: 137 mEq/L (ref 135–145)
Total Bilirubin: 0.4 mg/dL (ref 0.3–1.2)
Total Protein: 6.4 g/dL (ref 6.0–8.3)

## 2010-07-05 LAB — POCT CARDIAC MARKERS
CKMB, poc: 1 ng/mL (ref 1.0–8.0)
Myoglobin, poc: 81.8 ng/mL (ref 12–200)
Troponin i, poc: <0.05 ng/mL (ref 0.00–0.09)

## 2010-07-05 LAB — HEMOGLOBIN A1C
Hgb A1c MFr Bld: 5.8 % (ref 4.6–6.1)
Mean Plasma Glucose: 120 mg/dL

## 2010-07-05 LAB — VITAMIN B12: Vitamin B-12: 406 pg/mL (ref 211–911)

## 2010-07-05 LAB — TSH: TSH: 3.03 u[IU]/mL (ref 0.350–4.500)

## 2010-07-05 LAB — BRAIN NATRIURETIC PEPTIDE: Pro B Natriuretic peptide (BNP): <30 pg/mL (ref 0.0–100.0)

## 2010-08-11 NOTE — H&P (Signed)
NAMEMARTIE, DINGUS                   ACCOUNT NO.:  [id number]   MEDICAL RECORD NO.:  [id number]          PATIENT TYPE:  EMS   LOCATION:  ED                           FACILITY:  [facility]   PHYSICIAN:  Reyne Dumas, MD       DATE OF BIRTH:  1940/10/09   DATE OF ADMISSION:  09/26/2008  DATE OF DISCHARGE:                              HISTORY & PHYSICAL   PRIMARY CARE [place]:  Dr. Sherrilee Gilles. [place].   CHIEF COMPLAINT:  Redness of her left arm and numbness of her left face.   SUBJECTIVE:  This is a 70 year old female with a history of diabetes,  hypertension, dyslipidemia, gastroesophageal reflux disease, and CAD who  presents to the ER because of redness of right upper extremity.  The  patient had surgery done on her right upper extremity yesterday by Dr.  Hulen Skains, removal of a skin cancer.  She developed some lymphangitis and  was asked to come to the ER by Dr. Hulen Skains.  She was seen by Dr. Velvet Bathe  who treated her with one dose of IV vancomycin.  While the patient was  in the ER, she started developing some itching of her scalp and numbness  of her whole face.  The patient also complained of numbness in the  dorsal aspect of her left hand.  This lasted for about 10-15 minutes.  She denied any headache, blurry vision, denied any dysphagia, unilateral  weakness or slurred speech.  The patient was evaluated for a TIA, and is  being admitted to the medical service for further evaluation.   PAST MEDICAL HISTORY:  1. Coronary artery disease, status post stent placement to her LAD in      November 2007.  2. Dyslipidemia.  3. Hypertension.  4. Anxiety.  5. Non-insulin dependent diabetes.   ALLERGIES:  1. BETA-BLOCKER.  2. LIPITOR.   Medications  celexa 20 mg po daily  benicar 20 mg po daily  metofrmin 500 mg po daily  minocycline 100 mg po daily  fish oil 1000mg  po BID  VitD  Flax seed oil   SOCIAL HISTORY:  The patient discontinued smoking in 1975.  She does not  use alcohol.  She  is married and has three children.  She is retired.   FAMILY HISTORY:  Positive for diabetes, hypertension and coronary artery  disease.  Coronary artery disease is present in her father, her mother  and her brother.   PAST SURGICAL HISTORY:  1. Cholecystectomy.  2. Total abdominal hysterectomy.  3. Bilateral salpingo-oophorectomy.  4. Multiple squamous cell carcinoma excisions.  5. Arthroscopic surgery of her right knee.  6. Status post repair of her left shoulder.  7. Removal of a melanoma.   REVIEW OF SYSTEMS:  As documented in the HPI.   PHYSICAL EXAMINATION:  VITAL SIGNS:  Blood pressure 146/83, pulse 76,  respirations 16, temperature 98.2.  GENERAL:  The patient is comfortable.  Alert and oriented in no acute  distress.  NECK:  Supple without thyromegaly or adenopathy.  CARDIOVASCULAR:  Regular rate and rhythm.  No appreciable rubs, murmurs  or gallops.  LUNGS:  Clear to auscultation bilaterally.  No wheezes, rhonchi or  crackles.  ABDOMEN:  Soft, nontender, nondistended.  EXTREMITIES:  Without clubbing, cyanosis or edema.  The patient has mild  erythema and a red streak from her incision site just distal to her  wrist all the way up to her elbow.  NEUROLOGIC:  Cranial nerves II through XII grossly intact.  The patient  has mild decrease in the sensation of her left face, otherwise motor  strength and sensory symmetric in bilateral upper and lower extremities.  No cerebellar deficits noted.  PSYCHIATRIC:  Appropriate mood and affect.   LABORATORY DATA:  EKG shows normal sinus rhythm.  INR of 1.0.  Cardiac  enzymes negative.  CBC within normal limits.BMP pending   ASSESSMENT:  1. Transient ischemic attack.  2. Lymphangitis of the right upper extremity.  3. Diabetes.  4. Hypertension.  5. Dyslipidemia.   PLAN:  The patient will be admitted to telemetry.  Given the above TIA  like symptoms, we will obtain an MRI/MRA of the brain in the morning, 2-  D echo.  We will  start the patient on aspirin.  Neuro-Cheks q.2 h.  For  her lymphangitis, we will obtain a duplex ultrasound of her right upper  extremity to rule out a DVT.  We will continue the patient on IV  antibiotics, mainly cephazolin and discontinue the vancomycin because of  the itching that the patient developed while she was in the ER.  She is  a full code.      Reyne Dumas, MD  Electronically Signed     NA/MEDQ  D:  09/26/2008  T:  09/26/2008  Job:  [id number]

## 2010-08-11 NOTE — Op Note (Signed)
NAMEVENISHA, [name]                   ACCOUNT NO.:  [id number]   MEDICAL RECORD NO.:  [id number]          PATIENT TYPE:  INP   LOCATION:  5004                         FACILITY:  [facility]   PHYSICIAN:  Ninetta Lights, M.D. DATE OF BIRTH:  1941-01-24   DATE OF PROCEDURE:  11/29/2007  DATE OF DISCHARGE:                               OPERATIVE REPORT   PREOPERATIVE DIAGNOSIS:  Left knee end-stage degenerative arthritis,  varus alignment.   POSTOPERATIVE DIAGNOSIS:  Left knee end-stage degenerative arthritis,  varus alignment.   PROCEDURE:  Left total knee replacement, Stryker triathlon prosthesis.  Modified minimally invasive approach.  Cemented pegged posterior  stabilized #4 femoral component.  Cemented #4 tibial component with 9-mm  insert, posterior stabilized.  Resurfacing 35 mm x 10 mm patellar  component.  Soft tissue balancing.   SURGEON:  Ninetta Lights, MD   ASSISTANT:  Alyson Locket. Ricard Dillon, [place], present throughout the entire case  necessary for timely completion of procedure.   ANESTHESIA:  General.   BLOOD LOSS:  Minimal.   TOURNIQUET TIME:  1 hour 15 minutes.   SPECIMENS:  None.   COUNTS:  None.   COMPLICATIONS:  None.   DRESSING:  Soft compressive with knee immobilizer.   DRAINS:  Hemovac x1.   PROCEDURE:  The patient was brought to the operating room and placed on  the operating table in supine position.  After adequate anesthesia had  been obtained, left knee examined.  Varus alignment correctable to  neutral.  Minimal flexion and contraction, flexion better than 100.  Stable ligaments.  Tourniquet was applied.  Prepped and draped in the  usual sterile fashion.  Exsanguinated with elevation Esmarch, tourniquet  inflated to 350 mmHg.  Straight incision above the patella and down to  the tibial tubercle.  Medial arthrotomy, vastus splitting, preserving  quad tendon.  Knee exposed.  Grade 4 changes throughout.  Medial capsule  released.  Remnants of menisci,  cruciate ligaments, loose bodies, and  spurs removed.  Distal femur exposed.  Intramedullary guide was placed.  Distal cut at 5 degrees of valgus resecting 10 mm.  Using epicondylar  axis, the femur was sized, prepared, cut, and fitted with a #4 femoral  component.  This gave good coverage.  I had to undercut a little in the  front of the femur in order to get the full component, but if I enlarge  it, it would have been too large medial to lateral.  I was pleased with  the fitting of the trial.  Tibia exposed.  Extramedullary guide placed.  A 3-degree posterior slope cut.  Sufficient resection for a 9-mm  component.  Sized to #4 component.  Trials were put in place, #4 on the  femur, #4 on the tibia, and with a 9-mm insert, and a capsular release  medially.  I had full extension, full flexion, good mechanical axis,  good tracking, and full motion.  Patella was then exposed.  Posterior 10  mm removed.  Size drilled and fitted with a 35-mm component.  Excellent  patellofemoral tracking with trials.  All trials were removed.  All  recess examined to be sure all spurs were removed.  Copious irrigation  with a pulse irrigating device.  Cement prepared and placed on all  components, which were firmly seated.  Polyethylene attached to the  tibia and the knee reduced.  Once the cement hardened, the knee was  reexamined.  Full extension, full flexion, good alignment, good  stability, and good patellofemoral tracking.  Wound irrigated once  again.  Hemovac placed through a separate stab wound.  Arthrotomy was  closed with #1 Vicryl.  Skin and subcutaneous tissue with Vicryl and  staples.  Knee was injected with Marcaine.  Hemovac was clamped.  Sterile compressive dressing applied.  Tourniquet was deinflated and  removed.  Knee immobilizer applied.  Anesthesia reversed.  Brought to  the recovery room.  Tolerated the surgery well.  No complications.      Ninetta Lights, M.D.  Electronically  Signed     DFM/MEDQ  D:  11/30/2007  T:  11/30/2007  Job:  [id number]

## 2010-08-11 NOTE — Op Note (Signed)
NAMEKLOI, BAZ                   ACCOUNT NO.:  [id number]   MEDICAL RECORD NO.:  [id number]          PATIENT TYPE:  OIB   LOCATION:  5735                         FACILITY:  [facility]   PHYSICIAN:  John D. Zigmund Daniel, M.D. DATE OF BIRTH:  1940/09/15   DATE OF PROCEDURE:  08/23/2006  DATE OF DISCHARGE:                               OPERATIVE REPORT   ADMISSION DIAGNOSES:  Preretinal fibrosis right eye.   PROCEDURES:  Pars plana vitrectomy with 20-gauge system, retinal  photocoagulation membrane peel on the right eye.   SURGEON:  Chrystie Nose. Zigmund Daniel, M.D.   ASSISTANT:  Deatra Ina, MA.   ANESTHESIA:  General.   DETAILS:  Usual prep and drape.  Peritomies at 8, 10 and 2 o'clock.  The  5-mm infusion port anchored into place at 8 o'clock.  _______ and the  cutter placed at 10 and 2 o'clock respectively.  The contact lens ring  anchored into place at 6 and 12 o'clock.  Provisc placed on the corneal  surface and the flat contact lens was placed.  Pars plana vitrectomy was  begun just behind the pseudophakos.  The vitrectomy was carried down to  the macular surface where wrinkles and the preretinal membrane was  encountered.  The membrane was tightly adherent to the macular region.  This was peeled off of the macula with the 27-gauge pick diamond dusted  membrane scraper and the lighted pick.  Once the membrane was totally  removed from the macular region, the disk was brought up into the mid  vitreous cavity.  The vitreous cutter was used to remove the vitreous  remnants and membrane remnants.  The vitrectomy was carried out into the  far periphery with a _______ system.  The indirect ophthalmoscope laser  was moved into place. There were 754 burns placed around the retinal  periphery, the power 1000 milliwatts, 1000 microns each and 0.07 seconds  each.  A washout procedure was performed.  The instruments were removed  from the eye.  A 9-0 nylon was used to close the sclerotomy sites.  They  were tested and found to be tight.  The conjunctiva was closed with wet-  field cautery.  Polymyxin and gentamycin were irrigated into tenon  space.  Marcaine was injected around the globe for postop pain.  Decadron 10 mg was injected into the lower subconjunctival space.  TobraDex ophthalmic ointment and a patch and shield were placed.  The  patient was awakened and taken to recovery in satisfactory condition.      Chrystie Nose. Zigmund Daniel, M.D.  Electronically Signed     JDM/MEDQ  D:  08/23/2006  T:  08/24/2006  Job:  [id number]

## 2010-08-11 NOTE — Discharge Summary (Signed)
NAMEMAKINZE, TRUMBLY                   ACCOUNT NO.:  [id number]   MEDICAL RECORD NO.:  [id number]          PATIENT TYPE:  OUT   LOCATION:  MRI                          FACILITY:  [facility]   PHYSICIAN:  Domingo Mend, M.D. DATE OF BIRTH:  1940/08/16   DATE OF ADMISSION:  09/26/2008  DATE OF DISCHARGE:  09/26/2008                               DISCHARGE SUMMARY   DISCHARGE DIAGNOSES:  1. Transient ischemic attack.  2. Hypertension.  3. Hyperlipidemia.  4. Type 2 diabetes mellitus.  5. History of coronary artery disease, status post stent placement to      the left anterior descending coronary artery in November 2007.  6. Right forearm cellulitis, following a skin lesion excision.   DISCHARGE MEDICATIONS:  1. Include doxycycline 100 mg p.o. b.i.d. times 10 days.  2. Aspirin 325 mg daily.  3. Minocycline 100 mg daily.  4. Benicar 20 mg daily.  5. Premarin 0.625 mg daily.  6. Bumex 1 mg daily.  7. Metformin 500 mg daily.  8. Celexa 20 mg daily.   DISPOSITION/FOLLOWUP:  Ms. Certo is discharged home today in stable  condition..  She has an appointment to see Dr. Judeth Horn on Tuesday,  October 01, 2008, for followup on her right forearm cellulitis.  She is also  instructed to call Dr. Sherrilee Gilles. Fusco, who is her primary care  physician, for a hospital follow-up appointment.   CONSULTATIONS:  This hospitalization, Dr. Marland Kitchen T. Hoxworth with  surgery.   IMAGES/PROCEDURES:  1. Performed during this hospitalization include a 2-D echocardiogram      on September 26, 2008, that showed mild LVH with an ejection fraction of      55% to 60%, no wall motion abnormalities.  No evidence of diastolic      dysfunction.  2. An MRI,/MRA of the brain that showed mild atrophy, with no acute      intracranial findings.  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  3. She also had a CT scan of her head on September 26, 2008, that was      negative for bleed or any other  acute intracranial process, with      nonspecific white matter changes.  4. A chest x-Funes on September 26, 2008, that showed no acute disease.  5. Carotid Dopplers which showed no ICA stenosis, with vertebrals with      antegrade flow.   HISTORY AND PHYSICAL EXAM:  For full details, please see dictation by  Dr. Reyne Dumas on September 26, 2008; but in brief, Ms. Pangelinan is a very  pleasant 70 year old Caucasian lady who two days prior to admission had  a skin lesion on her right forearm excised by Dr. Hulen Skains in the office.  She subsequently developed some surrounding erythema and edema and  called Dr. Richarda Blade office, who recommended she come to the emergency  department for further evaluation.  Dr. Excell Seltzer saw the patient in the  emergency department and ordered 1 gram of vancomycin.  About an hour  after  the infusion had started, Ms. Uddin started experiencing left face  and arm numbness and tingling, as well as some slurred speech.  Hence,  she was admitted to our service with a diagnosis of a transient ischemic  attack, for further evaluation and management.   HOSPITAL COURSE:  1  -  Transient ischemic attack:  For her transient  ischemic attack, our workup has been negative including MRI, Dopplers  and echocardiograms.  Echo and Dopplers were okay.  She has been kept on  aspirin.  She has been seen by physical and occupational therapy, who  have declared that she has no further needs.  She did have a fasting  lipid profile, and we have started her on a statin.  The goal at this  point will be aggressive risk factor modification.  2  -  Hypertension:  She has maintained good blood pressure control  while in the hospital.  3  -  Hyperlipidemia:  Per patient report, she has had side effects from  statins in the past, most specifically myalgias; however, she is willing  to try another statin, so we will try her on pravastatin, which has been  shown to have a lower side effect profile from other  statins.  She is  instructed to discontinue the pravastatin immediately if she continues  to have myalgias, and to see her primary care physician.  LFTs this  hospitalization have been within normal limits.  Fasting lipid profile  in the hospital is as follows:  Total cholesterol of 220 and  triglycerides of 191, HDL of 76 and an LDL of 106.  With her history of  coronary artery disease, I believe her LDL goal should be below 70.  4  -  Type 2 diabetes mellitus:  Has been well-controlled.  Her A1cis  5.8.  She received IV contrast.  Her metformin has been on hold.  She  has been instructed to hold it one more day after discharge.  5  -  Coronary artery disease:  Has been stable this admission.  6  -  Right forearm cellulitis:  She has received two days of Zosyn.  She will take doxycycline 100 mg twice daily for an extra 10 days, and  follow up with Dr. Hulen Skains.  7  - The rest of her chronic medical issues:  Have not been a problem  this hospitalization.   DISCHARGE VITAL SIGNS:  Upon discharge blood pressure 129/82, heart rate  70, respirations 16, O2 saturation 93% on room air, with a temperature  of 97.9 degrees.      Domingo Mend, M.D.  Electronically Signed     EH/MEDQ  D:  09/27/2008  T:  09/27/2008  Job:  [id number]   cc:   Sherrilee Gilles. Gerarda Fraction, MD  Fax: [phone]   Darene Lamer. Hoxworth, M.D.  [address], [place]  [place] [address]

## 2010-08-11 NOTE — Discharge Summary (Signed)
[name], [name]                   ACCOUNT NO.:  [id number]   MEDICAL RECORD NO.:  [id number]          PATIENT TYPE:  INP   LOCATION:  5004                         FACILITY:  [facility]   PHYSICIAN:  Ninetta Lights, M.D. DATE OF BIRTH:  10/29/1940   DATE OF ADMISSION:  11/29/2007  DATE OF DISCHARGE:  12/02/2007                               DISCHARGE SUMMARY   FINAL DIAGNOSES:  1. 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  2. Hypertension.  3. Hyperlipidemia.  4. Gastroesophageal reflux disease.  5. Coronary artery disease.   HISTORY OF PRESENT ILLNESS:  A 70 year old white female with a history  of end-stage DJD of left knee and chronic pain, presented to our office  for preop evaluation for total knee replacement.  She had progressive  worsening pain with failed response to conservative treatment.  Significant decreased in her daily activities due to the ongoing  complaint.   HOSPITAL COURSE:  On November 29, 2007, the patient was taken to the  [facility] and a left knee total knee replacement procedure  performed.   SURGEON:  Ninetta Lights, MD   ASSISTANT:  Alyson Locket. Ricard Dillon, PA-C   ANESTHESIA:  General.   TOURNIQUET TIME:  84 minutes.   One Hemovac drain placed.  There were no surgical or anesthesia  complications, and the patient was transferred to recovery in stable  condition.   On November 30, 2007, the patient doing well with good pain control.  Vitals signs stable and afebrile.  Hemoglobin 10.7 and hematocrit 31.9.  INR 1.1.  Dressing clean, dry, and intact.  Calf nontender and  neurovascularly intact.  Pharmacy protocol Coumadin started.  Discontinued morphine PCA.  PT/OT consults.  On December 01, 2007, the  patient doing very well.  No complaints.  Vital signs stable and  afebrile.  Hemoglobin 9.9 and hematocrit 29.2.  INR 1.9.  Wound looks  good and staples intact.  No drains or signs of infection.  Calf  nontender and neurovascularly intact.  Hemovac drain discontinued.  Discontinued Foley.  Saline lock IV.  On December 02, 2007, the patient  doing well with good pain control.  She states that she is ready to go  home.  No complaints.  Vitals signs stable and afebrile.  WBC 6.5,  hematocrit 28.1, hemoglobin 9.4, and platelets 154.  Sodium 135,  potassium 4.2, chloride 98, CO2 of 31, BUN 6, creatinine 1.01, and  glucose 109.  INR of 3.0. Wound looks good and staples intact.  No  drains or signs of infection.  Calf nontender and neurovascularly  intact.   CONDITION:  Good and stable.   DISPOSITION:  Discharged home.   MEDICATIONS:  1. Percocet 10/325 one-two tablets p.o. q.4-6 h. p.r.n. for pain.  2. Robaxin 500 mg 1 tablet p.o. q.6 h. p.r.n. spasms.  3. Coumadin, pharmacy protocol.   INSTRUCTIONS:  The patient will work with home health PT and OT to  improve ambulation, knee range of motion, and strengthening.  Daily  dressing changes with  4x4 gauze and tape.  Weightbearing as tolerated.  Coumadin x4 weeks postop for DVT prophylaxis.  Followup in the office,  when she is at 2 weeks postop, for recheck.  Return sooner if needed.      Alyson Locket. Velora Heckler.      Ninetta Lights, M.D.  Electronically Signed    JMO/MEDQ  D:  02/14/2008  T:  02/15/2008  Job:  [id number]

## 2010-08-14 NOTE — Procedures (Signed)
[facility]  Patient:    [name], [name]                          MRN: [id number] Proc. Date: 05/16/00 Adm. Date:  [id number] Attending:  Jim Desanctis                           Procedure Report  PROCEDURE:  Colonoscopy.  GASTROENTEROLOGIST:  Jim Desanctis, M.D.  INDICATIONS:  Colon polyps.  ANESTHESIA:  Demerol 90 mg, Versed 9 mg.  PROCEDURE IN DETAIL:  With the patient mildly sedated and in the left lateral decubitus position, the Olympus video colonoscope was inserted in the rectum and then passed under direct vision to the cecum.  The cecum was identified by ileocecal valve and appendiceal orifice, both of which were photographed. From this point, the colonoscope was slowly withdrawn, taking circumferential views of the entire colonic mucosa, stopping in the rectum which appeared normal on direct view and showed internal hemorrhoids retroflexed view.  The endoscope was straightened and withdrawn.  The patients vital signs and pulse oximetry remained stable.  The patient tolerated the procedure well with no apparent complications.  FINDINGS:  Internal hemorrhoids.  Otherwise unremarkable colonoscopic examination to the cecum.  PLAN:  Repeat examination possibly in five years. DD:  05/16/00 TD:  05/16/00 Job: [id number]

## 2010-08-14 NOTE — Discharge Summary (Signed)
[name], [name]                   ACCOUNT NO.:  [id number]   MEDICAL RECORD NO.:  [id number]          PATIENT TYPE:  INP   LOCATION:  6526                         FACILITY:  [facility]   PHYSICIAN:  Leslye Peer, MD       DATE OF BIRTH:  1940-04-14   DATE OF ADMISSION:  04/25/2006  DATE OF DISCHARGE:  04/26/2006                               DISCHARGE SUMMARY   DISCHARGE DIAGNOSIS:  1. Chest pain, no progression of coronary disease at catheterization      this admission.  2. Known coronary disease with previous left anterior descending stent      November 2007.  3. Anxiety.  4. Treated hypertension.  5. Treated dyslipidemia.  6. Non-insulin-dependent diabetes.   HOSPITAL COURSE:  The patient is a 70 year old female followed by Dr.  Mathis Bud and Dr. Gerarda Fraction.  She had an LAD stent placed in November 2007.  She was admitted through the emergency room with chest pain worrisome  for unstable angina.  She was put on heparin and nitrates.  She  definitely some anxiety overly with her symptoms.  She was taken to the  catheterization lab April 26, 2006, by Dr. Rex Kras.  Catheterization  revealed patent LAD site.  The septal perforator did have an ostial 70%  narrowing.  The circumflex and RCA were normal. LV function was normal.  Dr. Rex Kras went ahead and stopped her atenolol because she has been  having vague fatigue and shortness of breath.  The patient has requested  followup with Dr. Rex Kras.   DISCHARGE MEDICATIONS:  1. Bumex 0.5 mg once a day.  2. Coated aspirin 325 mg a day.  3. Benicar/hydrochlorothiazide 20/12.5 daily.  4. Plavix 75 mg a day.  5. Nexium 40 mg a day.  6. TriCor 145 daily.  7. Premarin 0.625 mg once a day.  8. Metformin 500 mg a day. She will hold her metformin until Friday.   LABORATORY DATA:  White count 6.0, hemoglobin 12.6, hematocrit 37,  platelet count 226. INR is 1.1.  Sodium 137, potassium 3.6, BUN 34,  creatinine 1.2. CK-MB and troponins were negative  x3.   Chest x-Dinovo showed no acute cardiopulmonary disease.   PLAN:  1. The patient be discharged in stable condition.  2. She will stop her atenolol.  3. She does want to continue taking her Bumex for lower extremity      edema, although it appeared she may be      mildly dehydrated.  We will go ahead and drop the      hydrochlorothiazide and Benicar and send her home on Benicar 20 mg.  4. Will follow up BMP as an outpatient.      Erlene Quan, P.A.    ______________________________  Leslye Peer, MD    LKK/MEDQ  D:  04/26/2006  T:  04/26/2006  Job:  [id number]   cc:   Sherrilee Gilles. Gerarda Fraction, MD

## 2010-08-14 NOTE — Op Note (Signed)
[name], [name]                   ACCOUNT NO.:  [id number]   MEDICAL RECORD NO.:  [id number]          PATIENT TYPE:  AMB   LOCATION:  [place]                          FACILITY:  [facility]   PHYSICIAN:  Ninetta Lights, M.D. DATE OF BIRTH:  [birth year]   DATE OF PROCEDURE:  06/11/2004  DATE OF DISCHARGE:                                 OPERATIVE REPORT   PREOPERATIVE DIAGNOSIS:  Diffuse tricompartmental degenerative arthritis,  with medial and lateral meniscus tear.   POSTOPERATIVE DIAGNOSIS:  Diffuse tricompartmental degenerative arthritis,  with medial and lateral meniscus tear.   OPERATIVE PROCEDURE:  Right knee exam under anesthesia, arthroscopy, with  partial medial and lateral meniscectomy, chondroplasty all three  compartments.   SURGEON:  Ninetta Lights, M.D.   ASSISTANT:  Alyson Locket. Velora Heckler.   ANESTHESIA:  Knee block with sedation.   SPECIMENS:  None.   CULTURES:  None.   COMPLICATIONS:  None.   DRESSINGS:  Soft compressive.   PROCEDURE:  The patient was brought to the operating room and placed on the  operating table in the supine position.  After adequate anesthesia had been  obtained, right knee examined.  Good motion and stability, with  patellofemoral crepitus.  Tourniquet and leg holder applied.  Leg prepped  and draped in the usual sterile fashion.  Three portals were created, one  superolateral, and one in each medial and lateral parapatellar.  Inflow  catheter induced.  Knee extended.  Arthroscope introduced.  Knee inspected.  Diffuse grade 2 and 3 chondromalacia, especially lateral facet.  Chondroplasty to a stable surface.  Tracking reasonably good.  Cruciate  ligament was intact.  Medial compartment diffuse grade 3 changes.  Moderate  amount of thinning, but no grade 4 changes.  Extensive complex tear  posterior half medial meniscus.  Numerous displacement fragments.  Almost  the entire posterior half removed down to a stable rim.  Tapered into  remaining meniscus.  All chondral fragments, uneven surfaces debrided.  Laterally, some focal grade 2 and 3 changes on the condyle and plateau, but  not as extensive as medial.  Complex tearing centrally.  The entire lateral  meniscus saucerized out to a stable rim all the way around.  Retained  about half the meniscus at completion.  All recesses examined, and all loose  fragments were removed.  Instruments and fluid removed.  Portals of the knee  injected with Marcaine.  Portals closed with 4-0 nylon.  Sterile compressive  dressing applied.  Anesthesia reversed.  Brought to the recovery room.  Tolerated surgery well.  No complications.      DFM/MEDQ  D:  06/11/2004  T:  06/11/2004  Job:  [id number]

## 2010-08-14 NOTE — Cardiovascular Report (Signed)
NAMEBRANIAH, [name]                   ACCOUNT NO.:  [id number]   MEDICAL RECORD NO.:  [id number]          PATIENT TYPE:  INP   LOCATION:  6524                         FACILITY:  [facility]   PHYSICIAN:  Bryson Dames, M.D.DATE OF BIRTH:  10-20-1940   DATE OF PROCEDURE:  02/25/2006  DATE OF DISCHARGE:                            CARDIAC CATHETERIZATION   PROCEDURE:  1. Percutaneous coronary artery stent procedure in the mid left      anterior descending.  2. Selective left coronary angiography.  3. Elective placement of AngioSeal closure into the right femoral      artery successfully done.   COMPLICATIONS:  None.   ENTRY SITE:  Right femoral.   DYE USED:  Omnipaque.   PATIENT PROFILE:  [name] is a delightful 70 year old white female who  is a medical patient of Dr. Kerin Perna and a cardiology patient of Dr.  Odette Fraction.  She has a history of hypertension, degenerative disc  disease, rheumatoid arthritis, melanoma status post excision, esophageal  stricture status post dilatation, and recent chest discomfort. Dr. Aldona Bar did a cardiac cath at the [facility] yesterday and  it showed an 80% stenosis between septal perforator branch 1 and 2 in  the LAD.  The patient was admitted to [facility] after her  outpatient cardiac cath at the [facility] and was loaded  with 600 mg of Plavix yesterday and enters the cath lab today for  elective intervention.  No complications occurred.   DESCRIPTION OF PROCEDURE:  This was accomplished with a right  percutaneous femoral artery 6-French sheath and 6-French guide catheter  which was a Cordis FL4 without sideholes.  The guidewire used was a  Forte marker wire and the stent used was a 3 x 20 mm Liberte stent (this  was the choice stent for the case because the patient has upcoming  surgery in the near future and this will allow one month of Plavix  only).   The lesion was crossed with the balloon easily  and with no resistance.  I carefully placed the stent by going into multiple projections and  giving nitroglycerin.  I then deployed the stent with the stent balloon  then switched to a Durastar by Cordis post dilatation balloon.  I used  two of those, first a 3.25 and then a 3.5, and the final result was 3.6  after deploying the 3.5 Durastar balloon to 18 atmospheres of pressure  predicting a 3.6 mm lumen diameter.  The lesion was reduced from 80% to  0% and looked pristine in an LAO cranial view and in an RAO view which  was 47 RAO 12/15 caudal.  No complications occurred.  The patient did  have chest pain with balloon inflations that when away within two  minutes of balloon deflation.   FINAL DIAGNOSIS:  Successful percutaneous stenting of the mid left  anterior descending  with a 3 Liberte non drug-eluting stent deployed to  3.6 mm diameter.   PLAN:  The patient will be released tomorrow morning and will follow up  with the excellent care Dr. Mathis Bud.           ______________________________  Bryson Dames, M.D.     WHG/MEDQ  D:  02/25/2006  T:  02/25/2006  Job:  [id number]   cc:   Leslye Peer, MD  Sherrilee Gilles. Gerarda Fraction, MD

## 2010-08-14 NOTE — H&P (Signed)
NAMELEGEND, CARBONARO                   ACCOUNT NO.:  [id number]   MEDICAL RECORD NO.:  [id number]          PATIENT TYPE:  INP   LOCATION:  1828                         FACILITY:  [facility]   PHYSICIAN:  Broadus John, MD DATE OF BIRTH:  08-Jun-1940   DATE OF ADMISSION:  04/25/2006  DATE OF DISCHARGE:                              HISTORY & PHYSICAL   [name] is a 70 year old white woman who is admitted to [facility] for further evaluation of chest pain.   The patient's history of coronary artery disease dates back to November  2007.  At that time, after presenting with a history of chest pain, she  underwent cardiac catheterization and subsequent stenting of her LAD.  Her course has been uncomplicated since then.  She has not experienced  recurrent chest pain until today.   This evening while sedentary, the patient experienced the onset of chest  pain.  This was described as a pressure in the lowest substernal and  upper epigastric region.  It radiated through to her back and to her  neck bilaterally.  It was associated with dyspnea, diaphoresis, and  nausea.  There were no exacerbating or ameliorating factors.  It  appeared not to be related to position, activity, meals, or  respirations.  She took 3 nitroglycerin sprays at home.  She was  administered several additional nitroglycerin sprays by EMS en route to  the emergency department.  Her chest pain had subsided to some extent  but has not completely resolved.  The patient believes that this chest  pain is similar in quality to that which preceded her LAD stenting.   There is no history of myocardial infarction, left ventricular  dysfunction, congestive heart failure, or arrhythmia.   The patient has a number of risk factors for coronary artery disease  including:  1. Diabetes mellitus.  2. Hypertension.  3. Dyslipidemia.  4. Family history of coronary artery disease (father, mother, and      brother).   The  patient has no history of smoking.   OTHER MEDICAL PROBLEMS:  1. Degenerative disk disease.  2. Gastroesophageal reflux.  3. An esophageal stricture.  4. She underwent excision of a melanoma in April 2007.   MEDICATIONS:  1. Aspirin 325 mg p.o. daily.  2. Atenolol 25 mg p.o. b.i.d.  3. Benicar 20/12.5 p.o. daily.  4. Plavix 75 mg p.o. daily.  5. Premarin 0.25 mg p.o. daily.  6. Nexium 40 mg p.o. daily.  7. TriCor 145 mg p.o. daily.  8. Metformin 500 mg p.o. daily.   ALLERGIES:  LIPITOR.   OPERATIONS:  1. Cholecystectomy.  2. Total abdominal hysterectomy and bilateral salpingo-oophorectomy.  3. Multiple squamous cell carcinoma excisions.  4. Arthroscopic surgery on the right knee.  5. Surgical repair of a left shoulder injured.  6. Removal of a melanoma.   SOCIAL HISTORY:  She discontinue smoking in 1975.  She does not use  alcohol.  She is married and has 3 children.  She  is retired.   FAMILY HISTORY:  Diabetes mellitus, hypertension, and coronary  artery  disease as described above.   REVIEW OF SYSTEMS:  Reveals no new problems related to her head, eyes,  ears, nose, mouth, throat, lungs, gastrointestinal system, genitourinary  system, or extremities.  There is no history of neurologic or  psychiatric disorder.  There is no history of fever, chills, or weight  loss.   PHYSICAL EXAMINATION:  VITAL SIGNS:  Blood pressure 114/73, pulse 59 and  regular, respirations 20, temperature 97.1.  GENERAL:  The patient was an elderly white woman in no discomfort.  She  was alert, oriented, appropriate, and responsive.  HEENT:  Normal.  NECK:  Without thyromegaly or adenopathy.  Carotid pulses were palpable  bilaterally and without bruits.  CARDIAC:  Revealed a normal S1 and S2.  There was no S3, S4, murmur,  rub, or click.  Cardiac rhythm is regular.  No chest wall tenderness was  noted.  LUNGS:  Clear.  ABDOMEN:  Soft and nontender.  There was no mass, hepatosplenomegaly,   bruit, distension, rebound, guarding, or rigidity.  Bowel sounds were  normal.  BREASTS/PELVIC/RECTAL:  Not performed as they were not pertinent to the  reason for acute care hospitalization.  EXTREMITIES:  Without edema, deviation, or deformity.  Radial and  dorsalis pedal pulses were palpable bilaterally.  NEUROLOGIC:  Brief screening neurologic survey was unremarkable.   The electrocardiogram revealed a sinus bradycardia.  The tracing was  otherwise normal giving no repolarization abnormalities specific for  ischemia or infarction.  The chest radiograph and laboratory work had  not yet been performed at the time of this dictation.   IMPRESSION:  1. Chest pain, rule out unstable angina.  2. Coronary artery disease, status post left anterior descending      artery percutaneous intervention on February 25, 2006.  3. Hypertension.  4. Diabetes mellitus.  5. Dyslipidemia.  6. Gastroesophageal reflux.   PLAN:  1. Telemetry.  2. Serial cardiac enzymes.  3. Aspirin.  4. Plavix.  5. Intravenous heparin.  6. Intravenous nitroglycerin.  7. Atenolol.  8. Further measures per Dr. Mathis Bud.      Broadus John, MD  Electronically Signed     MSC/MEDQ  D:  04/25/2006  T:  04/26/2006  Job:  [id number]   cc:   Leslye Peer, MD

## 2010-08-14 NOTE — Procedures (Signed)
[facility]  Patient:    [name], [name]                          MRN: [id number] Proc. Date: 02/02/00 Adm. Date:  [id number] Attending:  Jim Desanctis                           Procedure Report  PROCEDURE:  Upper endoscopy with Savary dilation.  SURGEON:  Jim Desanctis, M.D.  INDICATIONS:  Dysphagia.  ANESTHESIA:  Demerol 100 mg and Versed 9 mg were given.  DESCRIPTION OF PROCEDURE:  With the patient mildly sedated in the left lateral decubitus position in the radiology department, the Olympus videoscopic endoscope was inserted in the mouth and passed under direct vision through the esophagus.  The lower ring was seen and photographed.  We entered into the stomach.  The fundus, body, antrum, duodenum bulb, and second portion of duodenum all appeared normal.  From this point, the endoscope was slowly withdrawn, taking circumferential views of the entire duodenal mucosa until the endoscope had been pulled back in the stomach and placed in retroflexion to view the stomach from below.  The endoscope was then straightened.  Placing the tip in the antrum, a guidewire was passed.  The endoscope removed taking circumferential views of the entire gastric and esophageal mucosa.  Then, over the guidewire, the 14, 17, and 19 Savary dilators were passed with minimal resistance.  The endoscope was then reinserted after the guidewire was removed with the 19 dilator.  Just a trace amount of blood was seen in the esophagus and no other lesions noted.  The endoscope was withdrawn.  The patients vital signs and pulse oximeter remained stable.  The patient tolerated the procedure well and without apparent complications.  FINDINGS:  Ring of the esophagus, subtle, but dilated to 14, 17, and 19 Savary dilators.  PLAN:  Will await clinical response and have the patient follow up with me as an outpatient. DD:  02/02/00 TD:  02/02/00 Job: [id number]

## 2010-08-14 NOTE — Discharge Summary (Signed)
NAMEANDREAL, VANDELOO                   ACCOUNT NO.:  [id number]   MEDICAL RECORD NO.:  [id number]          PATIENT TYPE:  INP   LOCATION:  [id number]                         FACILITY:  [place]   PHYSICIAN:  Bryson Dames, M.D.DATE OF BIRTH:  12-24-1940   DATE OF ADMISSION:  02/24/2006  DATE OF DISCHARGE:  02/26/2006                               DISCHARGE SUMMARY   [name] is a 70 year old female patient of Dr. __________ who had  undergone cardiac catheterization at [facility] by Dr.  Chase Picket secondary to chest pain and positive Cardiolite test.  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  Thus, she was brought to  [facility] for PCI.  She was put on IV heparin.  When she was  transferred she was having chest pain.  An EKG was done.  She had no EKG  changes.  She was given nitroglycerine and put on an IV nitroglycerine  drip with relief.  She underwent intervention on February 25, 2006 by  Dr. Myrtice Lauth, 80% LAD stenosis.  Underwent PTCA __________  stenting with a 3.0 x 20-mm Liberty stent.  She had no problems post  intervention.  She did have a vascular closure device.  On February 26, 2006 her blood pressure was 149/68.  Temperature 98.3, respirations 20.  Her hemoglobin was 13, hematocrit 37.6, platelets 203, WBC 7.3; sodium  135, potassium 4.1, BUN 9, creatinine 1.  CK-MB were negative.  Troponin  was only mildly elevated at 0.04.   DISCHARGE MEDICATIONS:  1. Plavix 75 mg one time per day.  2. Aspirin 325 mg one time per day.  3. Crestor 5 mg one time per day.  4. Benicar HCT 20/12.5 one time per day.  5. Nexium 40 mg one time per day.  6. Diltiazem CD 120 one time per day.  7. Premarin 0.625 mg one time per day.  8. __________ 0.5 mg one time per day.  9. Nitroglycerine p.r.n. for chest pain.   She should not sit in a tub of water for 7 days.  She will do no  strenuous activity, lifting, pushing, pulling x5 days.  No driving x1  day.   She will follow up with Dr. __________  in 1 to 2 weeks.  She will  call our office on Monday for an appointment.   DISCHARGE DIAGNOSES:  1. Angina and positive Cardiolite.  2. 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 Melvern Banker on February 25, 2006.  3. Normal ejection fraction.  4. Hypertension.  5. Hyperlipidemia.      Cyndia Bent, N.P.    ______________________________  Bryson Dames, M.D.    BB/MEDQ  D:  02/26/2006  T:  02/26/2006  Job:  [id number]

## 2010-08-14 NOTE — Op Note (Signed)
[name], [name]                   ACCOUNT NO.:  [id number]   MEDICAL RECORD NO.:  [id number]          PATIENT TYPE:  AMB   LOCATION:  [place]                          FACILITY:  [facility]   PHYSICIAN:  Gwenyth Ober, M.D.    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   POSTOPERATIVE DIAGNOSIS:  Squamous cell carcinoma of the left distal  forearm, previously excised.   PROCEDURE:  Reexcision of squamous cell cancer site on the distal left  forearm.   SURGEON:  Judeth Horn, M.D.   ASSESSMENT:  None.   ANESTHESIA:  Monitored anesthesia care with Xylocaine local.   ESTIMATED BLOOD LOSS:  Less than 20 cc.   No complications.  Condition is good.   SPECIMEN SENT:  Oval piece of skin measuring 7 x 5 cm in size.   INDICATIONS FOR OPERATION:  The patient is well known to me with squamous  cell CA excised by Dr. Tonia Brooms, who now comes in for reexcision.   OPERATION:  The patient was taken to the operating room and placed on the  table in the supine position.  After an adequate amount of IV sedation was  given, her arm was placed at 90 degrees to the body and she was prepped and  draped in the usual sterile manner exposing the distal forearm on the left  side.   We marked the area of excision using a marking pen, getting at least a  centimeter margin circumferentially.  We subsequently anesthetized the  marked area using 1% Xylocaine without epinephrine and taking it into the  subcu.  We then used a 15 blade to make an oval incision around the  previously marked site and attaining hemostasis with electrocautery.  We  undermined and made flaps circumferentially and then reapproximated the skin  using interrupted simple stitches of 4-0 nylon.  There was some tension in  the mid portion of the repair; however,  it did  come together well without significant tension and no obstruction of  arterial flow.  This patient had excellent radial and ulnar pulses.  This  lesion was more on the radial aspect of the forearm distally.  Sterile  dressing was applied including antibiotic ointment.      Gwenyth Ober, M.D.  Electronically Signed     JOW/MEDQ  D:  04/23/2005  T:  04/24/2005  Job:  [id number]   cc:   Hope M. Tonia Brooms, M.D.  Fax: [phone]

## 2010-08-14 NOTE — H&P (Signed)
[place]  Patient:    [name], [name]                          MRN: [id number] Adm. Date:  [id number] Attending:  Gwenyth Ober                         History and Physical  CHIEF COMPLAINT: The patient is a 70 year old patient, whose husband I have previously operated on, who comes in with symptomatic cholelithiasis and now presents for elective laparoscopic cholecystectomy.  HISTORY OF PRESENT ILLNESS: The patient has probably had some symptoms of gallbladder disease for more than 15-20 years; however, she has been treated with gastritis and reflux disease with intermittent relief.  Most recently, however, she has had much more severe problems, having had to go to the emergency room with abdominal pain.  Her most recent ultrasound demonstrated that she had a thickened gallbladder wall with a 2 cm mass impacted in the wall and a 3 cm stone.  She has tumefication of gallbladder sludge, possibly presenting as a mass.  She now comes in for elective procedure.  PAST SURGICAL HISTORY: Hysterectomy through lower midline incision.  CURRENT MEDICATIONS:  1. Prinivil for hypertension.  2. Aspirin p.r.n.  ALLERGIES: No known drug allergies.  She is not diabetic.  SOCIAL HISTORY: She is a nonsmoker.  FAMILY HISTORY: Skin cancer of left shoulder.  Sister with cervical cancer and breast cancer.  Numerous cases of cancer on her fathers side.  Mother has diabetes.  REVIEW OF SYSTEMS: She has no diarrhea or constipation.  PHYSICAL EXAMINATION:  GENERAL: She is well-developed, well-nourished.  VITAL SIGNS: No fever.  ABDOMEN: Mild right upper quadrant tenderness with no rebound or guarding. Good bowel sounds.  CARDIAC: Regular rate and rhythm.  No murmurs, gallops, lifts, or heaves.  CHEST: Clear to auscultation.  NECK: Supple.  HEENT: No scleral icterus.  EOMI.  PERRL.  RECTAL/PELVIC: Examinations deferred.  IMPRESSION: Symptomatic  cholelithiasis with possible acute cholecystitis.  PLAN: The patient is to be taken to the operating room for elective laparoscopic cholecystectomy, possible cholangiogram depending on liver function tests; however, liver function tests on admission did not show an elevated bilirubin, alkaline phosphatase. DD:  12/25/99 TD:  12/25/99 Job: [id number]

## 2010-08-14 NOTE — Op Note (Signed)
[address] [facility]  Patient:    [name], [name] Visit Number: [id number] MRN: [id number]          Service Type: DSU Location: [facility] Attending Physician:  Meriel Flavors Dictated by:   Ninetta Lights, M.D. Proc. 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  PROCEDURE:  Left shoulder exam under anesthesia, arthroscopy, debridement of labrum and under surface of rotator cuff, acromioplasty, CA ligament release, excision of distal clavicle.  SURGEON:  Ninetta Lights, M.D.  ASSISTANT:  Aaron Edelman D. Petrarca, P.A.-C.  ANESTHESIA:  General.  ESTIMATED BLOOD LOSS:  Minimal.  SPECIMENS:  None.  CULTURES:  None.  COMPLICATIONS:  None.  DRESSING:  Self-compressive with sling.  DESCRIPTION OF PROCEDURE:  The patient was brought to the operating room and placed on the operating table in the supine position.  After adequate anesthesia had been obtained, the left shoulder was examined.  Full motion and good stability.  Placed in the beach chair position on the shoulder positioner, prepped and draped in the usual sterile fashion.  Three standard arthroscopic portals, anterior, posterior, and lateral.  Shoulder entered with a blunt obturator, distended, and inspected.  Displaced labral tear circumferentially and debrided to a stable surface.  Remaining labrum intact. Biceps tendon and biceps anchor intact.  Some mild ________ on the glenoid, but not marked.  Capsular and ligamentous structures intact.  Under surface of rotator cuff  crescent portion partial thickness tearing 50% all the way through.  Debrided.  No full thickness tears.  Cable portion well-anchored. Cannula redirected subacromially.  Very laterally placed musculature in the supraspinatus tendon, _______ into the cuff, almost at the tuberosity attachment.  Chronic impingement with down sloping of acromion and marked impingement from the distal clavicle where there was extensive periarticular spurs and the joint was exposed.  Bursa resected and top of the cuff debrided. Acromioplasty to a type 1 acromion with a shaver and high speed bur.  Lateral centimeter of clavicle resected including all periarticular spurs.  Adequacy of decompression, clavicle excision, confirmed viewing from all portals. Instruments and fluid removed.  Portals, shoulder, and bursa injected with Marcaine.  Portals closed with 4-0 nylon.  A sterile compressive dressing applied.  Anesthesia reversed.  Brought to the recovery room.  Tolerated the surgery well with no complications. Dictated by:   Ninetta Lights, M.D. Attending Physician:  Meriel Flavors DD:  04/27/01 TD:  04/28/01 Job: [id number]

## 2010-08-14 NOTE — Op Note (Signed)
NAMEPERCY, [name]                   ACCOUNT NO.:  [id number]   MEDICAL RECORD NO.:  [id number]          PATIENT TYPE:  AMB   LOCATION:  [facility]                          FACILITY:  [place]   PHYSICIAN:  Gwenyth Ober, M.D.    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   PROCEDURE:  Wide excision of left foot melanoma wall along with left groin  Lymphazurin injection and sentinel lymph node biopsy.   SURGEON:  Gwenyth Ober, M.D.   ANESTHESIA:  General endotracheal.   ESTIMATED BLOOD LOSS:  Less than 30 mL.   COMPLICATIONS:  None.   CONDITION:  Stable.   SPECIMEN:  Oval patch of skin surrounding previously excised melanoma  measuring 5 x 3 cm in size and also 3 lymph nodes which contained  Lymphazurin and radioactivity.   INDICATIONS FOR OPERATION:  The patient is a 70 year old patient with a  recently excised 0.65-mm-deep melanoma of the instep of the left foot, who  now comes in for wide excision and left groin sentinel lymph node biopsy.   FINDINGS:  The patient had 3 left groin lymph nodes which had significant  radioactivity.  The largest activity was actually in the left lymph node,  which we removed, which was above the inguinal ligament on the left side.  The melanoma primary lesion did not appear to have any primary extension  deep into the tissue and we excised down into the subcutaneous fat.   OPERATION:  The patient was taken to the operating room and placed on the  table in a supine position.  She had been injected with radioactive  isotope  prior to coming to the preoperative holding area and we injected her with 5  mL of Lymphazurin in the OR around the wound and palpated and a rubbed it in  for approximately 5 minutes.  We then prepped and draped the left foot and  left groin separately for procedure.   We marked the 1.2-mm margins wide and approximately 2- to 3-cm long, sort of  longitudinal and transverse incision lines for the excision of the foot  lesion.  We used a 10 blade to make the incision around the lesion in an  oval pattern, sort of an eye-shaped pattern, down into subcutaneous tissue  and then dissected it out using electrocautery.  Suture ligation was  necessary for some of the veins that ran into the foot, and this was done  with 3-0 Vicryl.  We used cautery also for electrocautery.  Once we had the  specimen removed, we marked the anteroinferior border of the skin using a  Vicryl suture and sent off it for permanent sections.   After attaining adequate hemostasis, we closed in 2 layers, a 3-0 Vicryl in  the subcutaneous layer and the  skin was closed using interrupted 4-0 nylon  simple sutures.   The closure at this level was not very tight, but tight enough to bring the  skin together, but not with undue tension.   We then opened up the drape at the groin level and then used the NeoProbe in  order to identify the area of radioactivity.  We then marked the line of  incision right at the left groin crease.  We made an incision using a #10  blade and then used the NeoProbe to direct our dissection to the 3 lymph  nodes, which were dissected out with Metzenbaum scissors, lymphatics being  clipped and clamped using small hemoclips.   The initial one was just behind the saphenous vein complex, medial to it and  deep.  We then got more superficial ones just superior and lateral to the  saphenous vein complex in the groin, dissecting these out and clipping the  lymphatics and tributaries with small hemoclips.  Once all 3 specimens were  sent, we irrigated with saline, obtained hemostasis using electrocautery and  then we closed in 2 layers, a deep subcutaneous  layer of 4-0 Vicryl and then  the skin was closed using running a subcuticular stitch of 4-0 Monocryl.  All needle counts, sponge counts and instrument counts were correct.  Sterile dressings were applied to both wounds.      Gwenyth Ober, M.D.  Electronically Signed     JOW/MEDQ  D:  07/30/2005  T:  07/31/2005  Job:  [id number]

## 2010-08-14 NOTE — Op Note (Signed)
NAMESUESAN, FOLZ                   ACCOUNT NO.:  [id number]   MEDICAL RECORD NO.:  [id number]          PATIENT TYPE:  AMB   LOCATION:  [place]                          FACILITY:  [place]   PHYSICIAN:  David L. Wilburn Cornelia, M.D.DATE OF BIRTH:  1940-07-22   DATE OF PROCEDURE:  04/26/2005  DATE OF DISCHARGE:  04/23/2005                                 OPERATIVE REPORT   NO DICTATION           ______________________________  Early Chars. Wilburn Cornelia, M.D.     DLS/MEDQ  D:  [id number]  T:  04/26/2005  Job:  [id number]

## 2010-08-14 NOTE — Op Note (Signed)
NAMEKELEN, BEM                   ACCOUNT NO.:  [id number]   MEDICAL RECORD NO.:  [id number]          PATIENT TYPE:  AMB   LOCATION:  [facility]                         FACILITY:  [place]   PHYSICIAN:  Waverly Ferrari, M.D.    DATE OF BIRTH:  [birth year]   DATE OF PROCEDURE:  07/06/2005  DATE OF DISCHARGE:                                 OPERATIVE REPORT   INDICATIONS:  Colon cancer screening.   ANESTHESIA:  Demerol 100 mg, Versed 10 mg, Phenergan 25 mg.   PROCEDURE:  With the patient mildly sedated in the left lateral decubitus  position, the Olympus videoscopic colonoscope was inserted in the rectum and  passed under direct vision with pressure applied until we reached the cecum.  The cecum was identified by the ileocecal valve and appendiceal orifice,  both of which were photographed.  From this point the colonoscope was slowly  withdrawn, taking circumferential views of colonic mucosa, stopping only  then in the rectum which appeared normal on direct and showed hemorrhoids on  retroflexed view.  The endoscope was straightened and withdrawn.  The  patient's vital signs and pulse oximeter remained stable.  The patient  tolerated the procedure well without apparent complications.   FINDINGS:  Unremarkable examination other than internal hemorrhoids.   PLAN:  Have patient follow-up with me in 5 years or as needed.           ______________________________  Waverly Ferrari, M.D.     GMO/MEDQ  D:  07/06/2005  T:  07/06/2005  Job:  [id number]

## 2010-08-14 NOTE — Op Note (Signed)
NAMEALLIAH, [name]                   ACCOUNT NO.:  [id number]   MEDICAL RECORD NO.:  [id number]          PATIENT TYPE:  AMB   LOCATION:  ENDO                         FACILITY:  [place]   PHYSICIAN:  Waverly Ferrari, M.D.    DATE OF BIRTH:  Aug 25, 1940   DATE OF PROCEDURE:  06/28/2005  DATE OF DISCHARGE:                                 OPERATIVE REPORT   PROCEDURE:  Upper endoscopy with dilation.   INDICATIONS:  Dysphagia with previous known stricture.   ANESTHESIA:  Fentanyl 5 mcg, Versed 10 mg, Phenergan 12.5 mg.   PROCEDURE:  With the patient mildly sedated in the left lateral decubitus  position, the Olympus videoscopic endoscope was inserted in the mouth and  passed under direct vision through the esophagus which appeared normal.  There was no evidence of Barrett's esophagus seen at this time. We entered  into the stomach, and there was some blood flecks seen in the stomach, and  some polyps were noted in the fundic folds. Duodenal bulb and second portion  duodenum were eventually seen and appeared normal.  From this point, the  endoscope was slowly withdrawn, taking circumferential views of duodenal  mucosa until the endoscope had been pulled back into the stomach and placed  in retroflexion viewing the stomach from below. The endoscope was then  straightened and pulled back to the fundus where polyps were seen,  identified and biopsied.  We then advanced the endoscope back to the antrum  and passed guidewire. The endoscope was withdrawn, taking circumferential  views of the remaining gastric and esophageal mucosa.  Subsequently, with  fluoroscopic guidance over the guidewire, we passed Savary dilators, 15 and  17, rather easily. No blood was seen on either dilator. With the latter, the  guidewire was removed.  The patient's vital signs and pulse oximeter  remained stable.  The patient tolerated the procedure well and without  complication.   FINDINGS:  Polyps of the gastric fundus,  biopsied, and dilation of the  distal esophagus to 15 and 17 Savary dilator under fluoroscopic control.   PLAN:  Await clinical response.  The patient will call me for results of  biopsy and follow-up with me as an outpatient.           ______________________________  Waverly Ferrari, M.D.     GMO/MEDQ  D:  06/28/2005  T:  06/28/2005  Job:  [id number]

## 2010-08-14 NOTE — Procedures (Signed)
[facility]  Patient:    [name], [name]                          MRN: [id number] Proc. Date: 05/26/00 Adm. Date:  [id number] Attending:  Jim Desanctis                           Procedure Report  PROCEDURE:  Colonoscopy.  INDICATIONS:  Colon polyps.  ANESTHESIA:  Demerol 90 mg, Versed 9 mg.  DESCRIPTION OF PROCEDURE:  With the patient mildly sedated in the left lateral decubitus position, the Olympus videoscopic colonoscope was inserted into the rectum and passed under direct vision to the cecum, identified the ileocecal valve and appendiceal orifice, both of which were photographed.  From this point the colonoscope was slowly withdrawn, taking circumferential views of the entire colonic mucosa stopping in the rectum which appeared normal and the rectum showed internal hemorrhoids on retroflexed view.  The endoscope was straightened and withdrawn.  The patients vital signs and pulse oximeter readings were stable.  The patient tolerated the procedure well without apparent complications.  FINDINGS:  Internal hemorrhoids, otherwise unremarkable colonoscopic examination to the cecum.  PLAN:  Repeat examination possibly in 5 years. DD:  05/16/00 TD:  05/16/00 Job: [id number]

## 2010-08-14 NOTE — Op Note (Signed)
[facility]  Patient:    [name], [name]                          MRN: [id number] Proc. Date: 12/25/99 Adm. Date:  [id number] Attending:  Gwenyth Ober CC:         Charisse Klinefelter, M.D.  Alysia Penna. Doug Sou, M.D.   Operative Report  PREOPERATIVE DIAGNOSIS:  Symptomatic cholelithiasis and possible acute cholecystitis.  POSTOPERATIVE DIAGNOSIS:  Symptomatic cholelithiasis and possible acute cholecystitis.  OPERATION PERFORMED:  Laparoscopic cholecystectomy.  SURGEON:  Judeth Horn, M.D.  ASSISTANT:  Dr. Marlou Starks  ANESTHESIA:  General endotracheal.  ESTIMATED BLOOD LOSS:  Less than 20 cc.  COMPLICATIONS:  None.  CONDITION:  Stable.  INDICATIONS FOR PROCEDURE:  The patient is a 70 year old female with abdominal pain in the right upper quadrant.  She now comes in for elective laparoscopic cholecystectomy.  FINDINGS:  The patient had evidence of acute gallbladder disease and also acute perihepatic disease with multiple adhesions between the diaphragm and the anterior abdominal wall and the liver.  In addition to the laparoscopic cholecystectomy, multiple adhesions were taken down between the liver and the anterior abdominal wall.  DESCRIPTION OF PROCEDURE:  The patient was taken to the operating room and placed on the table in supine position.  After adequate general anesthetic was administered, she was prepped and draped in the usual sterile manner exposing the midline and the right upper quadrant of the abdomen.  A #11 blade was used to make a superumbilical curvilinear incision down to the the midline fascia.  It was through this incision that a Veress needle was passed into the peritoneal cavity with the patient in reversed Trendelenburg position and the anterior abdominal wall tented up using sharp towel clips. Once the Veress needle was confirmed to be in adequate position using the saline test, carbon dioxide insufflation was instilled into  the peritoneal cavity through the Veress needle up to a maximal intra-abdominal pressure of 50 mmHg.  Once this was in the gas was in a 10-11 mm trocar and cannula were passed through the superumbilical fascia into the peritoneal cavity and confirmed to be in adequate positioning using the laparoscope with attached camera light source.  Subsequently, two right costal margin cannulas in the subxiphoid, 10-11 mm cannula were passed into the peritoneal cavity under direct vision. With all cannulas in place we could see that there were adhesions to the capsule of the liver to the anterior abdominal wall.  Without placing the patient in more Trendelenburg position these adhesions were taken down using electrocautery and scissor dissection.  Once the adhesions were taken down, the patient was placed in steeper reversed Trendelenburg position and her left side was tilted down.  The dome of the gallbladder was grasped with a ratched grasper through the lateralmost 5 mm cannula into the right upper quadrant.  Once this was done, there were adhesions to the unfundibulum of the gallbladder which were taken down using blunt dissection.  Using this technique, we were able to isolate the cystic duct and the cystic artery both of which were ____________ down for adequate length.  The cystic duct was short; however, we were able to clip it free of the common bile duct. We clipped it proximally and distally and subsequently transected the cystic duct and the cystic artery.  We then dissected the gallbladder out of its bed with minimal difficulty using a spatula electrocautery.  Once the  gallbladder was removed from the bed we were able to bring it out through the superumbilical fascia with minimal difficulty; however, we did have to crush a large stone that was contained within.  The fascia did not have to be enlarged.  We irrigated with a small amount of saline and found there to be no bilious or  bloody leakage.  We allowed all the gas to escape through the cannulas and then subsequently removed all the instruments and cannulas.  0.25% Marcaine was then injected at all the incision sites.  The superumbilical fascia was reapproximated using a figure-of-eight stitch of 0 Vicryl passed on a UR-6 needle.  The skin was closed using a running subcuticular stitch of 4-0 Vicryl.  Sponge, needle and instrument counts were correct.  A sterile dressing was applied to all wounds. DD:  12/25/99 TD:  12/25/99 Job: [id number]

## 2010-08-14 NOTE — Procedures (Signed)
NAMEJINA, [name]                   ACCOUNT NO.:  [id number]   MEDICAL RECORD NO.:  [id number]          PATIENT TYPE:  OUT   LOCATION:  SLEEP CENTER                 FACILITY:  [facility]   PHYSICIAN:  Clinton D. Annamaria Boots, M.D. DATE OF BIRTH:  1940/06/24   DATE OF STUDY:  05/21/2004                              NOCTURNAL POLYSOMNOGRAM   DATE OF STUDY:  May 21, 2004   REFERRING PHYSICIAN:  Dr. Lorene Dy   INDICATION FOR STUDY:  Hypersomnia with sleep apnea.  Epworth Sleepiness  Score 10/24, BMI 27, weight 175 pounds.   SLEEP ARCHITECTURE:  Total sleep time 320 minutes with sleep efficiency 80%.  Stage I was 5%, stage II 80%, stages III and IV were absent, REM was 15% of  total sleep time.  Sleep latency 34 minutes, REM latency 126 minutes, awake  after sleep onset 35 minutes, arousal index increased at 28.9.  She took  Ambien at bedtime.   RESPIRATORY DATA:  NPSG protocol.  Respiratory disturbance index (RDI) 9.5  obstructive events per hour indicating mild obstructive sleep apnea/hypopnea  syndrome.  She slept only supine.  REM RDI 38.   OXYGEN DATA:  Mild to moderate snoring with oxygen desaturation to a nadir  of 82% with respiratory events.  Mean oxygen saturation through the study  was 93% on room air.   CARDIAC DATA:  Normal sinus rhythm.   MOVEMENT/PARASOMNIA:  A total of 94 limb jerks were recorded of which 39  were associated with arousal or awakening for a periodic limb movement with  arousal index of 7.1 per hour which is increased.   IMPRESSION/RECOMMENDATION:  1.  Mild obstructive sleep apnea/hypopnea syndrome, respiratory disturbance      index 9.5 per hour with oxygen desaturation to 82% and mild to moderate      snoring.  2.  Periodic limb movement with arousal, 7.1 per hour.  3.  Consider return for continuous positive airway pressure titration if      history documents consequences of sleep apnea such as excessive daytime      sleepiness or hypertension.   Otherwise consider alternative therapies      including weight loss, sleeping off the flat of the back, treatment for      any nasal congestion, and possibly a trial of rapid eye movement      suppression therapy with a tricyclic antidepressant at that time.      CDY/MEDQ  D:  05/24/2004 11:30:43  T:  05/24/2004 20:53:07  Job:  [id number]

## 2010-08-14 NOTE — Op Note (Signed)
   [name], [name]                             ACCOUNT NO.:  [id number]   MEDICAL RECORD NO.:  [id number]                   PATIENT TYPE:  AMB   LOCATION:  ENDO                                 FACILITY:  [facility]   PHYSICIAN:  Waverly Ferrari, M.D.                 DATE OF BIRTH:  [birth year]   DATE OF PROCEDURE:  08/06/2002  DATE OF DISCHARGE:                                 OPERATIVE REPORT   PROCEDURE:  Upper endoscopy with biopsy.   INDICATIONS FOR PROCEDURE:  Abdominal pain.  See previous clinical notes.   ANESTHESIA:  Demerol 70, Versed 9 mg.   The patient states that her abdominal pain has lessened, become less  frequent, less severe. Proceed with endoscopy. She is still having twinges  of pain.   DESCRIPTION OF PROCEDURE:  With the patient mildly sedated in the left  lateral decubitus position, the Olympus videoscopic endoscope was inserted  in the mouth and passed under direct vision through the esophagus which  appeared normal. There was no evidence of Barrett's. We entered into the  stomach and in the fundus, there were flecks of blood, some fresh and some  older that were photographed. The fundus itself appeared normal. The body,  antrum, duodenal bulb and second portion of the duodenum were viewed. From  this point, the endoscope was slowly withdrawn taking circumferential views  of the duodenal mucosa until the endoscope was then pulled back into the  stomach, placed in retroflexion to view the stomach from below. The  endoscope was then straightened and withdrawn taking circumferential views  of the remaining gastric and esophageal mucosa stopping in the body of the  stomach where there were some changes of gastritis with a tiny amount of  redness of the underlying mucosa which was photographed and biopsied. The  patient's vital signs and pulse oximeter remained stable. The patient  tolerated the procedure well without apparent complications.   FINDINGS:  Changes of  gastritis in the body of the stomach. Await biopsy  report. The patient will call me for results and followup with me as an  outpatient.                                               Waverly Ferrari, M.D.    GMO/MEDQ  D:  08/06/2002  T:  08/06/2002  Job:  [id number]   cc:   Robyn Haber, M.D.  [address] [place]  [place] [address]  Fax: [phone]

## 2010-08-14 NOTE — Discharge Summary (Signed)
NAMEPAYSEN, HAMILTON                   ACCOUNT NO.:  [id number]   MEDICAL RECORD NO.:  [id number]          PATIENT TYPE:  INP   LOCATION:  6526                         FACILITY:  [facility]   PHYSICIAN:  Leslye Peer, MD       DATE OF BIRTH:  1940/06/03   DATE OF ADMISSION:  04/25/2006  DATE OF DISCHARGE:  04/26/2006                               DISCHARGE SUMMARY   ADDENDUM TO DISCHARGE   Instead of giving Ms. Gildner a new prescription for just plain Benicar.  We  will continue her current medications and check a followup BMP on  Monday.      Erlene Quan, P.A.    ______________________________  Leslye Peer, MD    LKK/MEDQ  D:  04/26/2006  T:  04/27/2006  Job:  [id number]

## 2010-08-14 NOTE — Cardiovascular Report (Signed)
[name], [name]                   ACCOUNT NO.:  [id number]   MEDICAL RECORD NO.:  [id number]           PATIENT TYPE:   LOCATION:                                 FACILITY:   PHYSICIAN:  Jeanella Craze. Little, M.D.      DATE OF BIRTH:   DATE OF PROCEDURE:  04/26/2006  DATE OF DISCHARGE:                            CARDIAC CATHETERIZATION   INDICATIONS FOR TEST:  This 70 year old female was admitted with chest  pain.  She had a Liberte a stent placed to her LAD, February 25, 2006.  She describes a discomfort in her chest to be comparable to what she had  had prior to stent placement.   After obtaining informed consent, the patient was prepped and draped in  the usual sterile fashion exposing the right groin.  Following local  anesthetic with 1% Xylocaine, the Seldinger technique was employed and a  5-French introducer sheath was placed in the right femoral artery.  The  left and right coronary arteriography and ventriculography in the RAO  projection was performed.   COMPLICATIONS:  None.   TOTAL CONTRAST:  80 mL.   EQUIPMENT:  5-French Judkins configuration catheters.   RESULTS:  1. Hemodynamic monitoring:  Central aortic pressure was 149/79.  Left      ventricular pressure was 142/13, and there was no aortic valve      gradient at the time of pullback.  2. Ventriculography:  Ventriculography in the RAO projection revealed      normal LV systolic function.  The ejection fraction was greater      than 55%.  The left ventricular end-diastolic pressure was 21, and      there was +1 mitral regurgitation.   CORONARY ARTERIOGRAPHY:  On fluoroscopy, a stent was noted in the  distribution of the left main.  1. Left main:  This was a short vessel and appeared to be free of      disease.  2. LAD:  There was a stent in the proximal portion of the LAD which      was widely patent.  The first diagonal came off within the stent      and there was a small second diagonal, both of which were  free of      disease.  The ongoing LAD was free of disease across the apex of      the heart.  There was a second septal perforator that was small      that had ostial 70% narrowing.  This vessel was less than 1 mm in      diameter.  3. Circumflex:  Gave rise to 2 OM vessels free of disease.  4. RCA: Gave rise to the PDA and posterior lateral branch, all of      which were free of disease.   CONCLUSION:  1. Normal left ventricular systolic function.  2. +1 mitral regurgitation.  3. Widely patent stent in the left anterior descending .   I cannot explain her complaints of chest discomfort from her cardiac  anatomy.   Because of her  shortness of breath and fatigue, I have stopped her  atenolol.  She will follow up with Dr. Mathis Bud and may benefit from low-  dose Coreg but I would give her a short interval free period off of beta  blockers to see whether or not her complaints improve.  Her discomfort  in her chest and upper neck could possibly be related to metformin,  which she has recently started on.           ______________________________  Jeanella Craze. Little, M.D.     ABL/MEDQ  D:  04/26/2006  T:  04/26/2006  Job:  [id number]   cc:   Leslye Peer, MD  Sherrilee Gilles. Gerarda Fraction, MD  Catheterization Lab  [place] Little, M.D.

## 2010-08-21 IMAGING — CR DG CHEST 2V
2 series · 2 of 2 positions shown · non-contrast
Comparison: 11/05/2008

CLINICAL DATA: Pelvic prolapse.  Preop.  Chronic cough,
hypertension.

CHEST - 2 VIEW

[w chest pa]
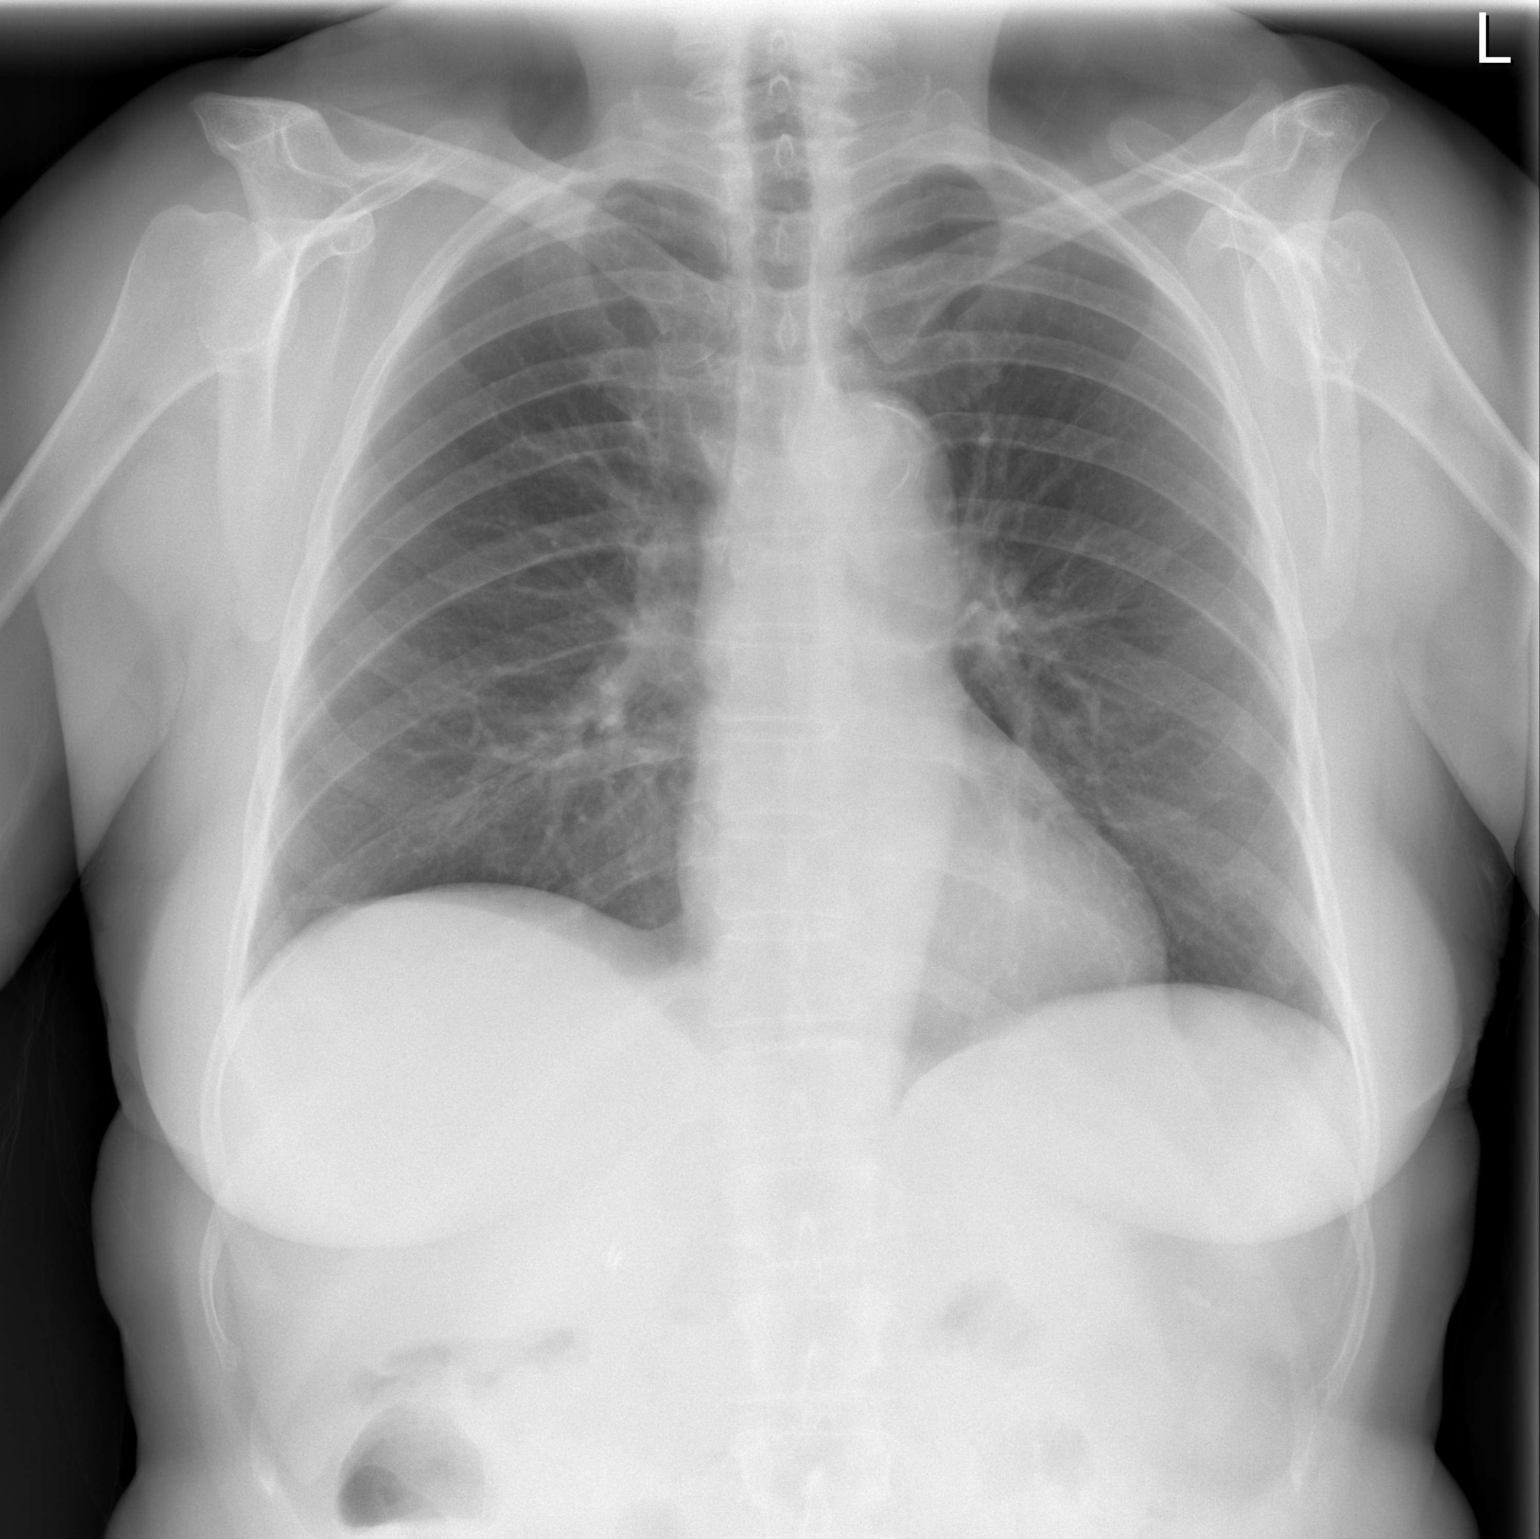

[w chest lat]
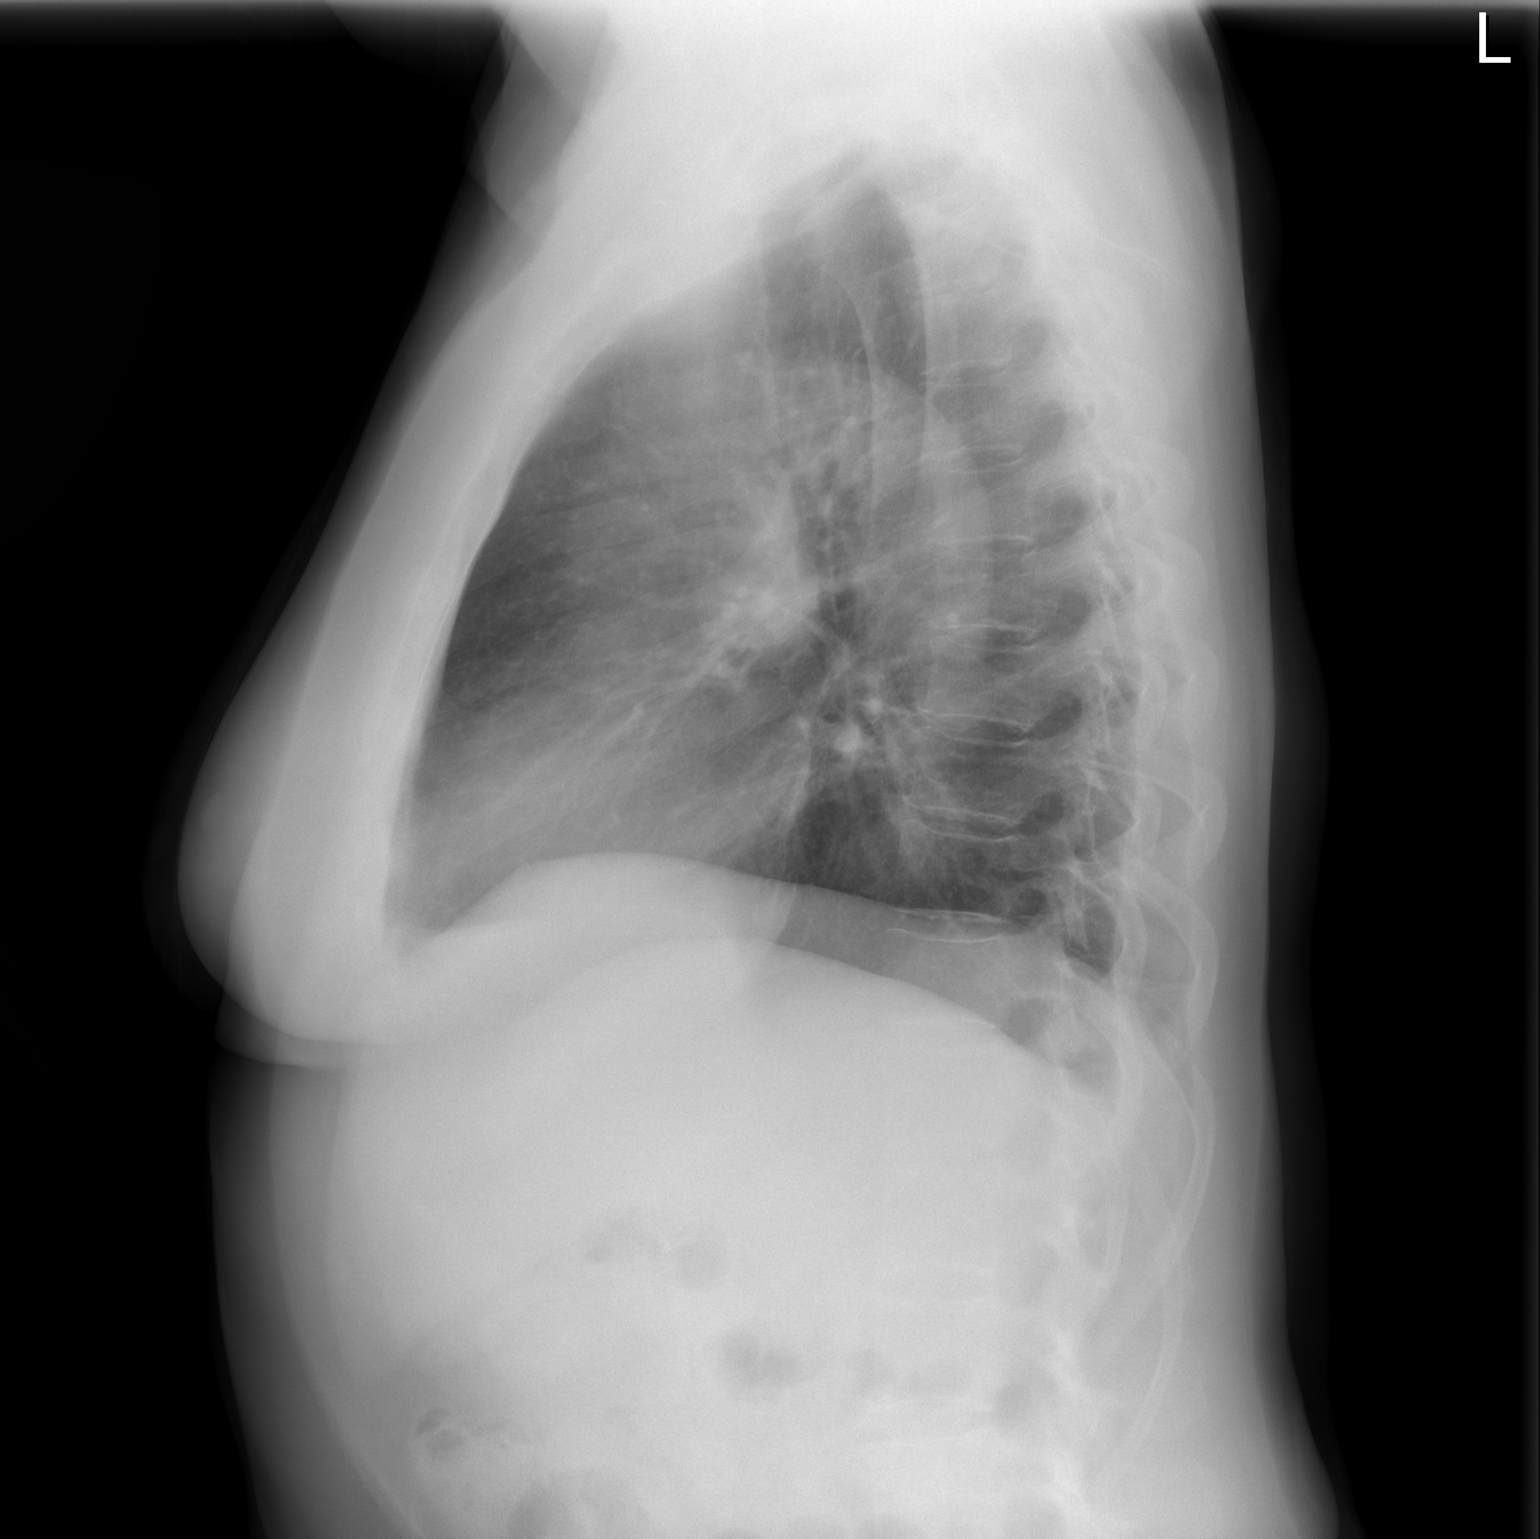

[2 of 2 positions shown; findings below may reference images not displayed]

FINDINGS: Heart and mediastinal contours are within normal limits.
No focal opacities or effusions.  No acute bony abnormality.
IMPRESSION: No active disease.

## 2010-09-02 ENCOUNTER — Other Ambulatory Visit: Payer: Self-pay | Admitting: Internal Medicine

## 2010-09-02 DIAGNOSIS — Z1231 Encounter for screening mammogram for malignant neoplasm of breast: Secondary | ICD-10-CM

## 2010-09-28 ENCOUNTER — Ambulatory Visit
Admission: RE | Admit: 2010-09-28 | Discharge: 2010-09-28 | Disposition: A | Discharge status: Home / Self Care | Payer: Medicare Other | Source: Ambulatory Visit | Attending: Internal Medicine | Admitting: Internal Medicine

## 2010-09-28 DIAGNOSIS — Z1231 Encounter for screening mammogram for malignant neoplasm of breast: Secondary | ICD-10-CM

## 2010-10-05 ENCOUNTER — Other Ambulatory Visit: Payer: Self-pay | Admitting: Gastroenterology

## 2010-12-17 IMAGING — CT CT HEAD W/O CM
1 series · 15 of 30 positions shown, 19 images · non-contrast
Comparison: MRI and CT of the brain 09/26/2008 and earlier.

CLINICAL DATA: 68-year-old female with acute onset of left
headache.  History of endarterectomy.

CT HEAD WITHOUT CONTRAST
TECHNIQUE: Contiguous axial images were obtained from the base of
the skull through the vertex without contrast.

[Series 2: head routine 4.8 h37s · axial · 0.45mm/px · z∈[+1270,+1398]mm · 15 of 30 slices shown, 19 images]
[im 2/30  brain]
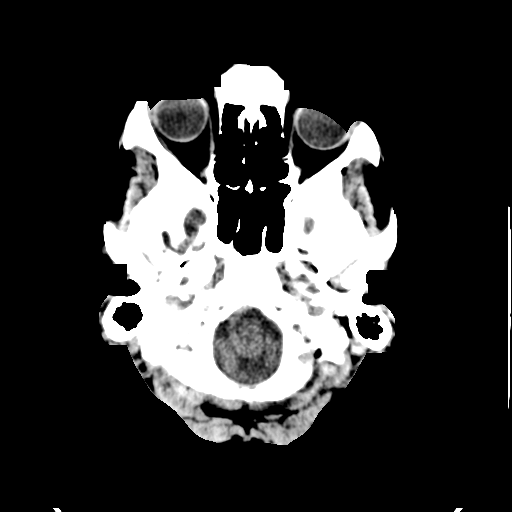
[im 2/30  bone]
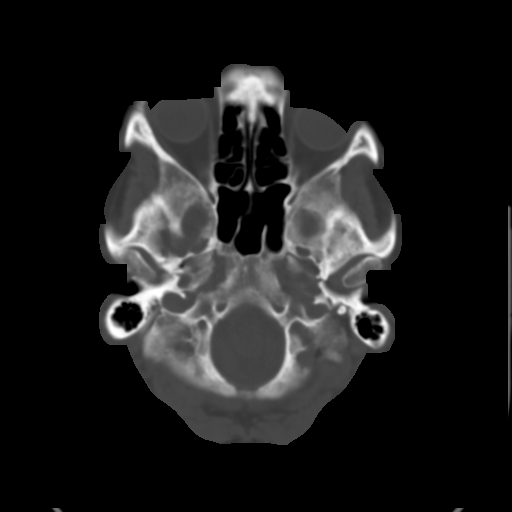
[im 4/30  brain]
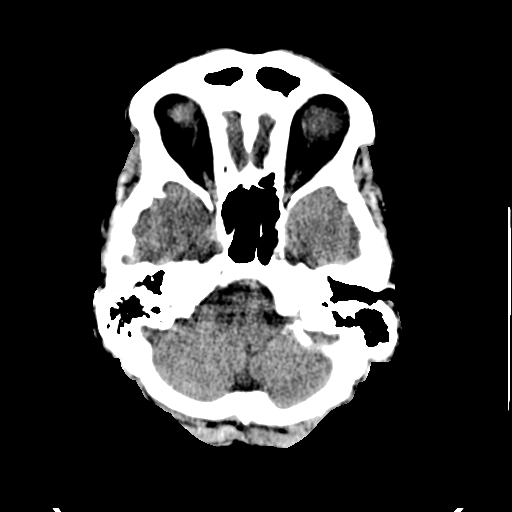
[im 6/30  brain]
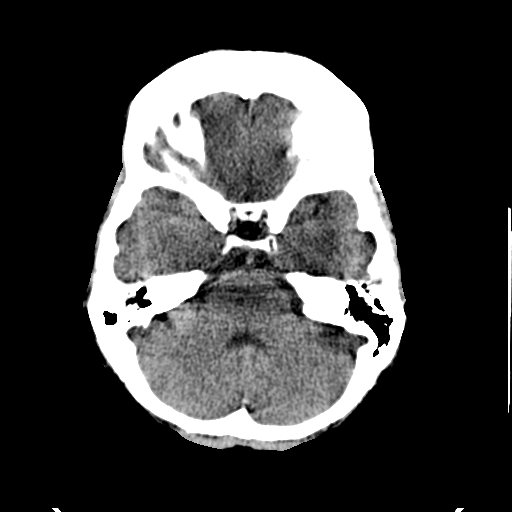
[im 8/30  brain]
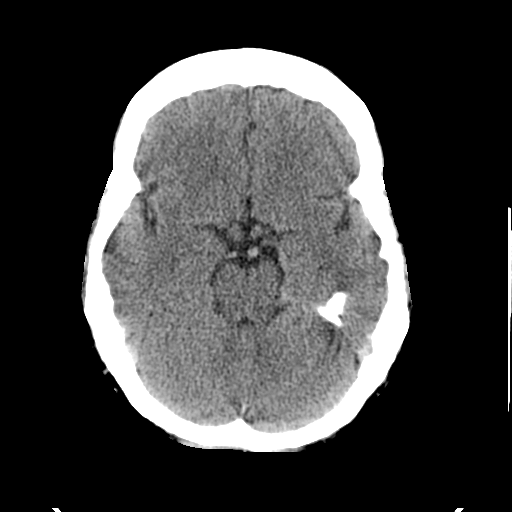
[im 10/30  brain]
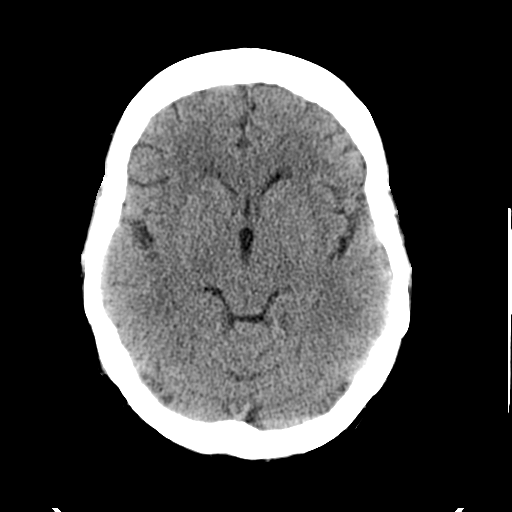
[im 10/30  bone]
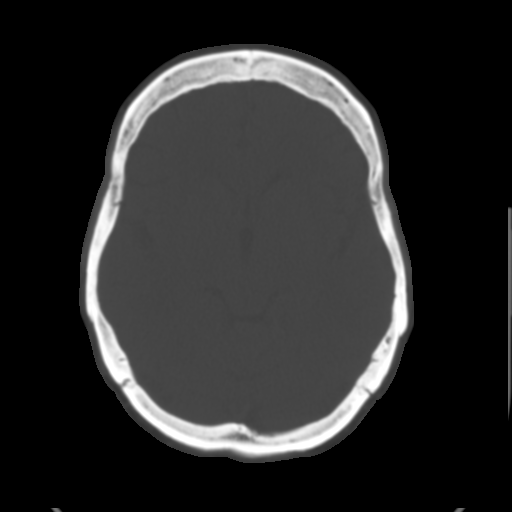
[im 12/30  brain]
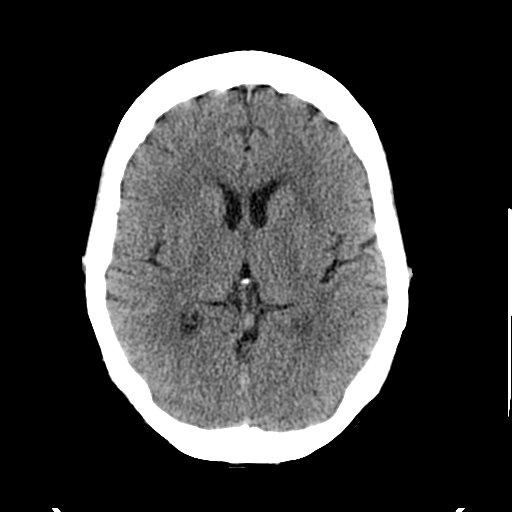
[im 14/30  brain]
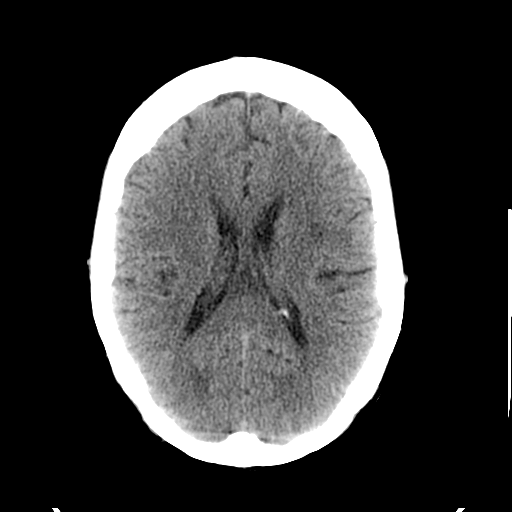
[im 16/30  brain]
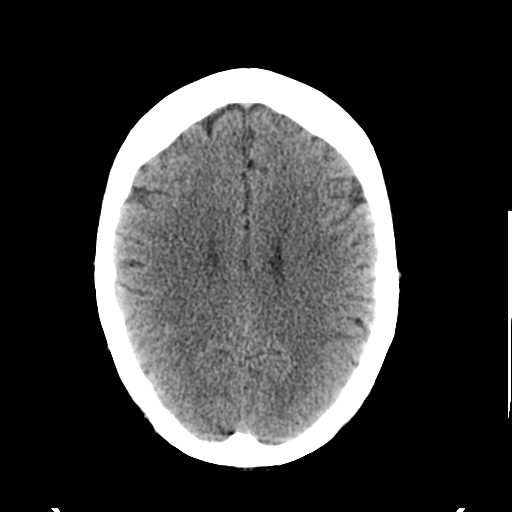
[im 17/30  brain]
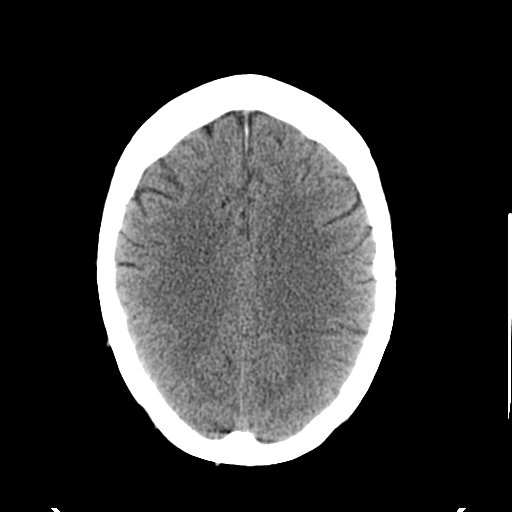
[im 17/30  bone]
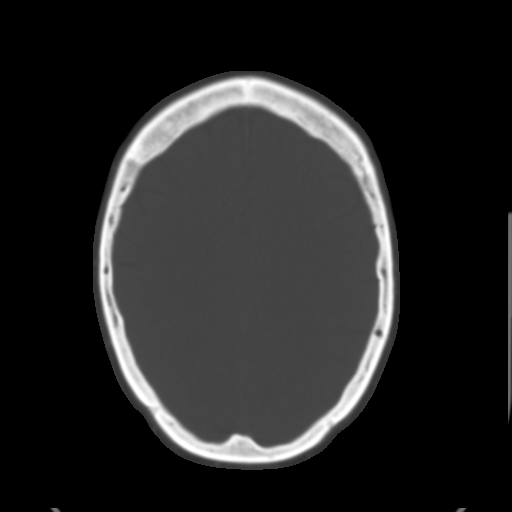
[im 19/30  brain]
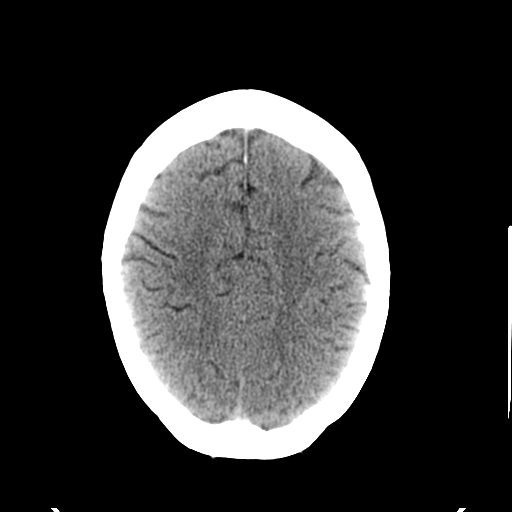
[im 21/30  brain]
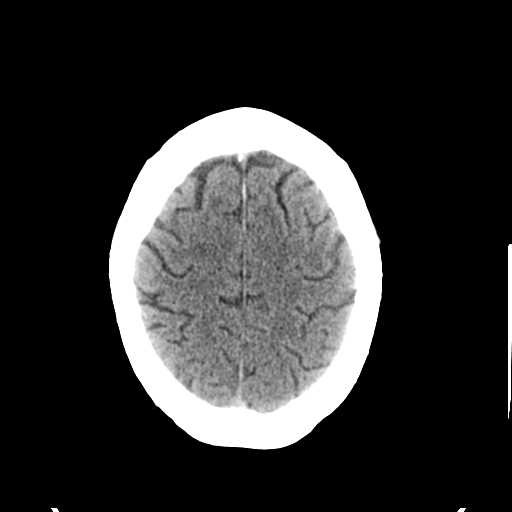
[im 23/30  brain]
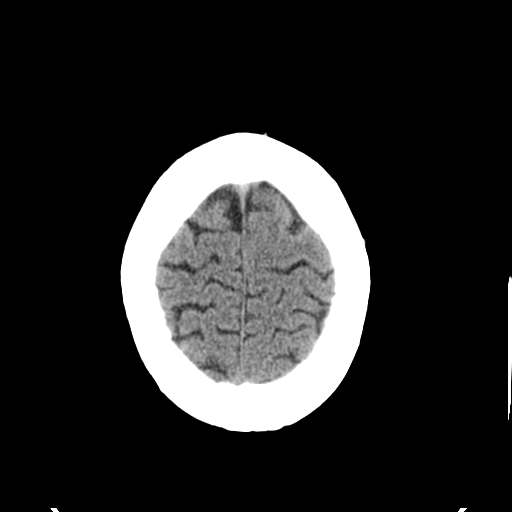
[im 25/30  brain]
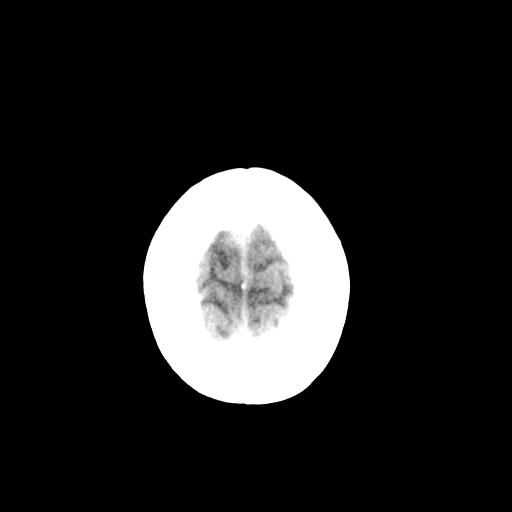
[im 25/30  bone]
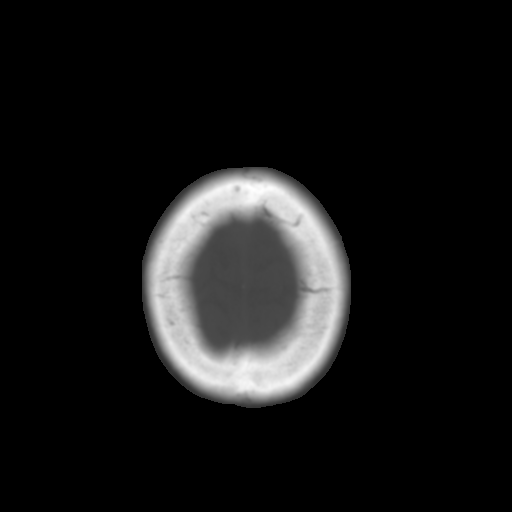
[im 27/30  brain]
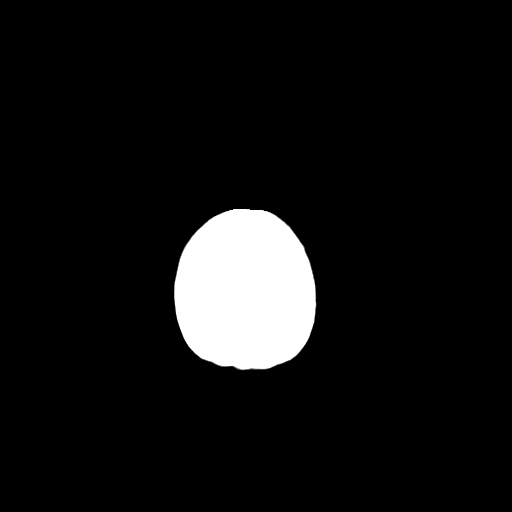
[im 29/30  brain]
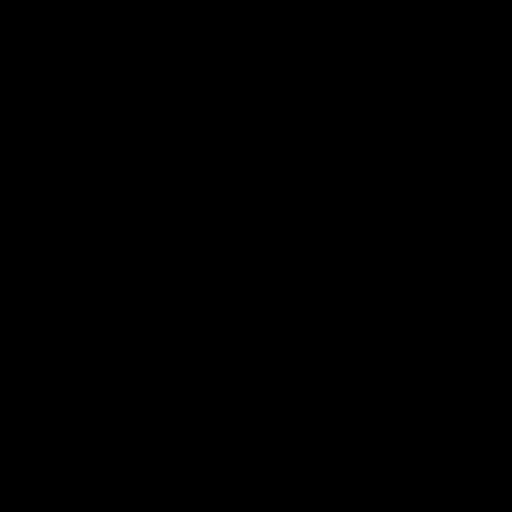

[15 of 30 positions shown; findings below may reference images not displayed]

FINDINGS: Calcified atherosclerosis at the skull base.  Visualized
orbits and scalp soft tissues are within normal limits.  Visualized
paranasal sinuses and mastoids are clear.  No acute osseous
abnormality identified.

Patchy left greater than right cerebral white matter hypodensity is
stable. Cerebral volume is within normal limits for age.
Ventricular size and configuration are within normal limits.  No
midline shift, mass effect, or evidence of mass lesion.  No acute
intracranial hemorrhage identified.  No evidence of cortically
based acute infarction identified.  No suspicious intracranial
vascular hyperdensity.
IMPRESSION: Stable noncontrast CT appearance of the brain.  Left greater than
right patchy cerebral white matter disease compatible with sequelae
of small vessel ischemia.

## 2010-12-17 IMAGING — CR DG CHEST 2V
2 series · 2 of 2 positions shown · non-contrast
Comparison: 02/25/2009

CLINICAL DATA: Chest pain/weakness

CHEST - 2 VIEW

[w chest pa]
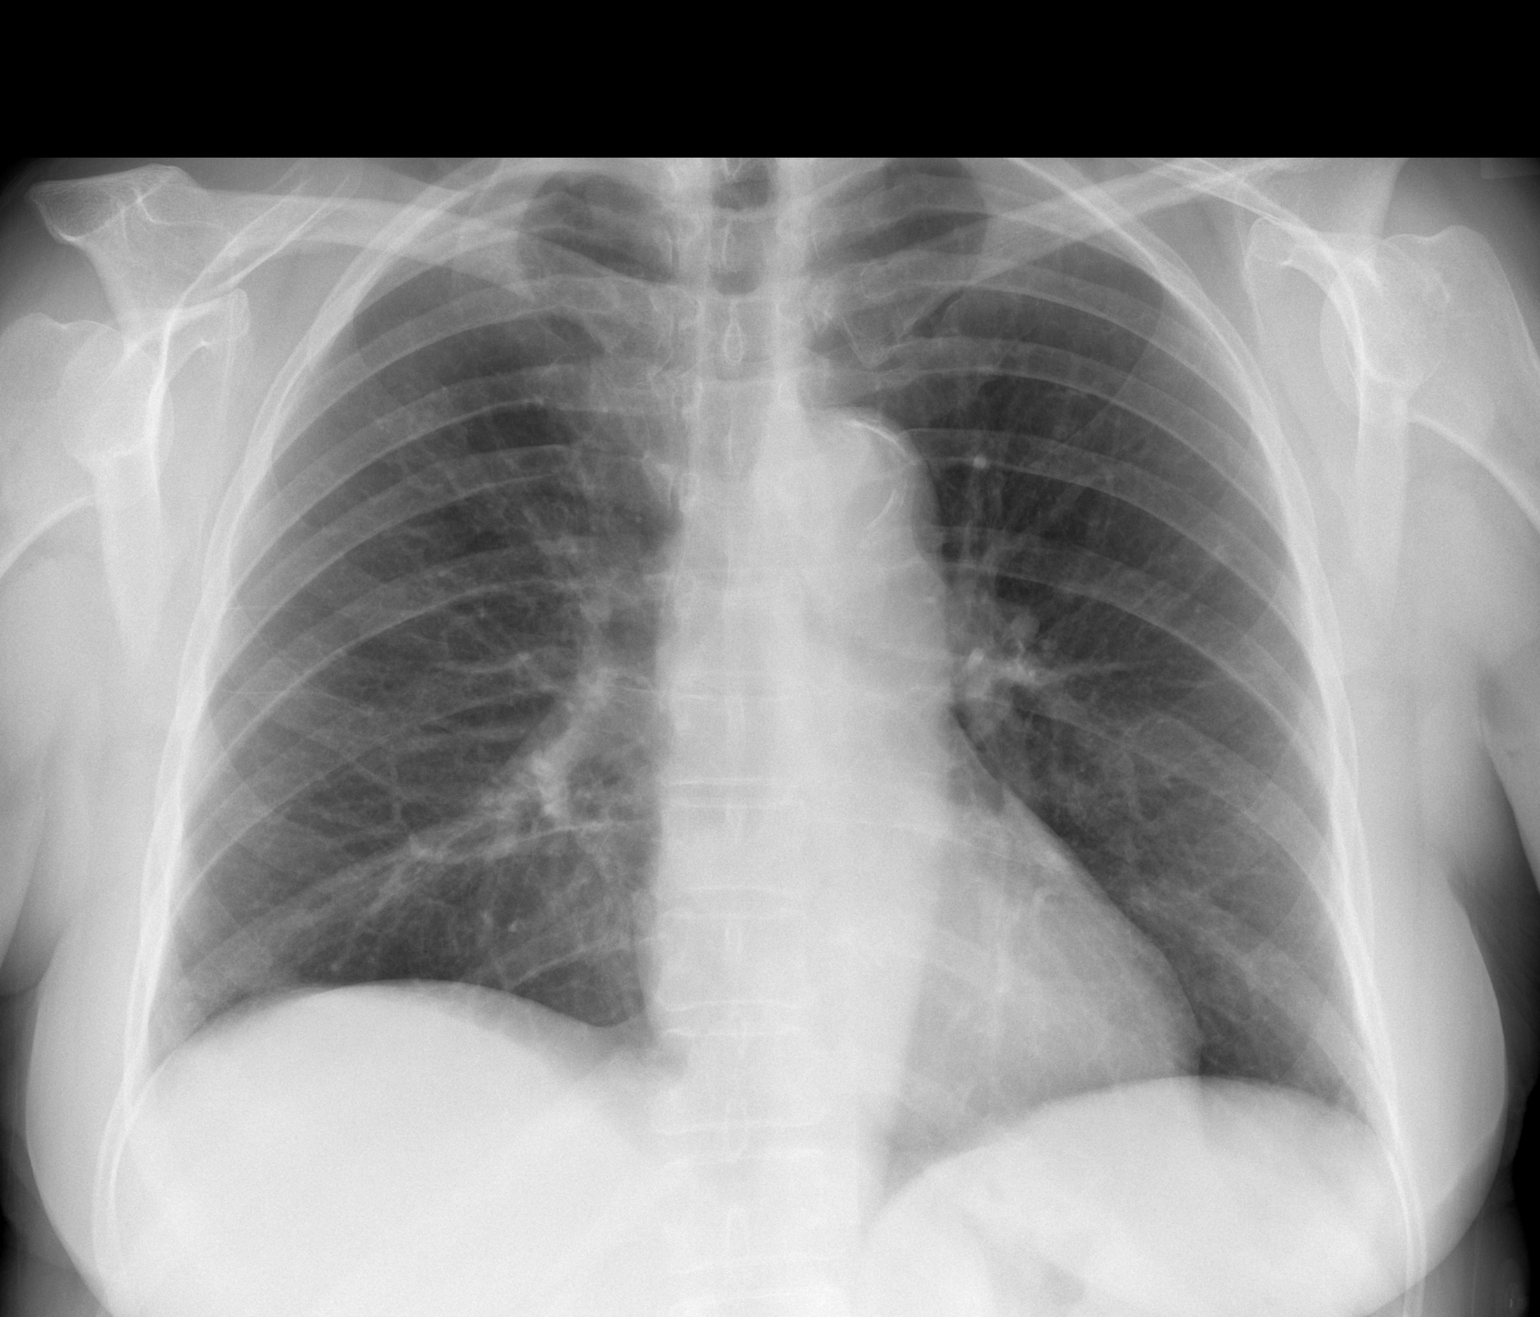

[w chest lat]
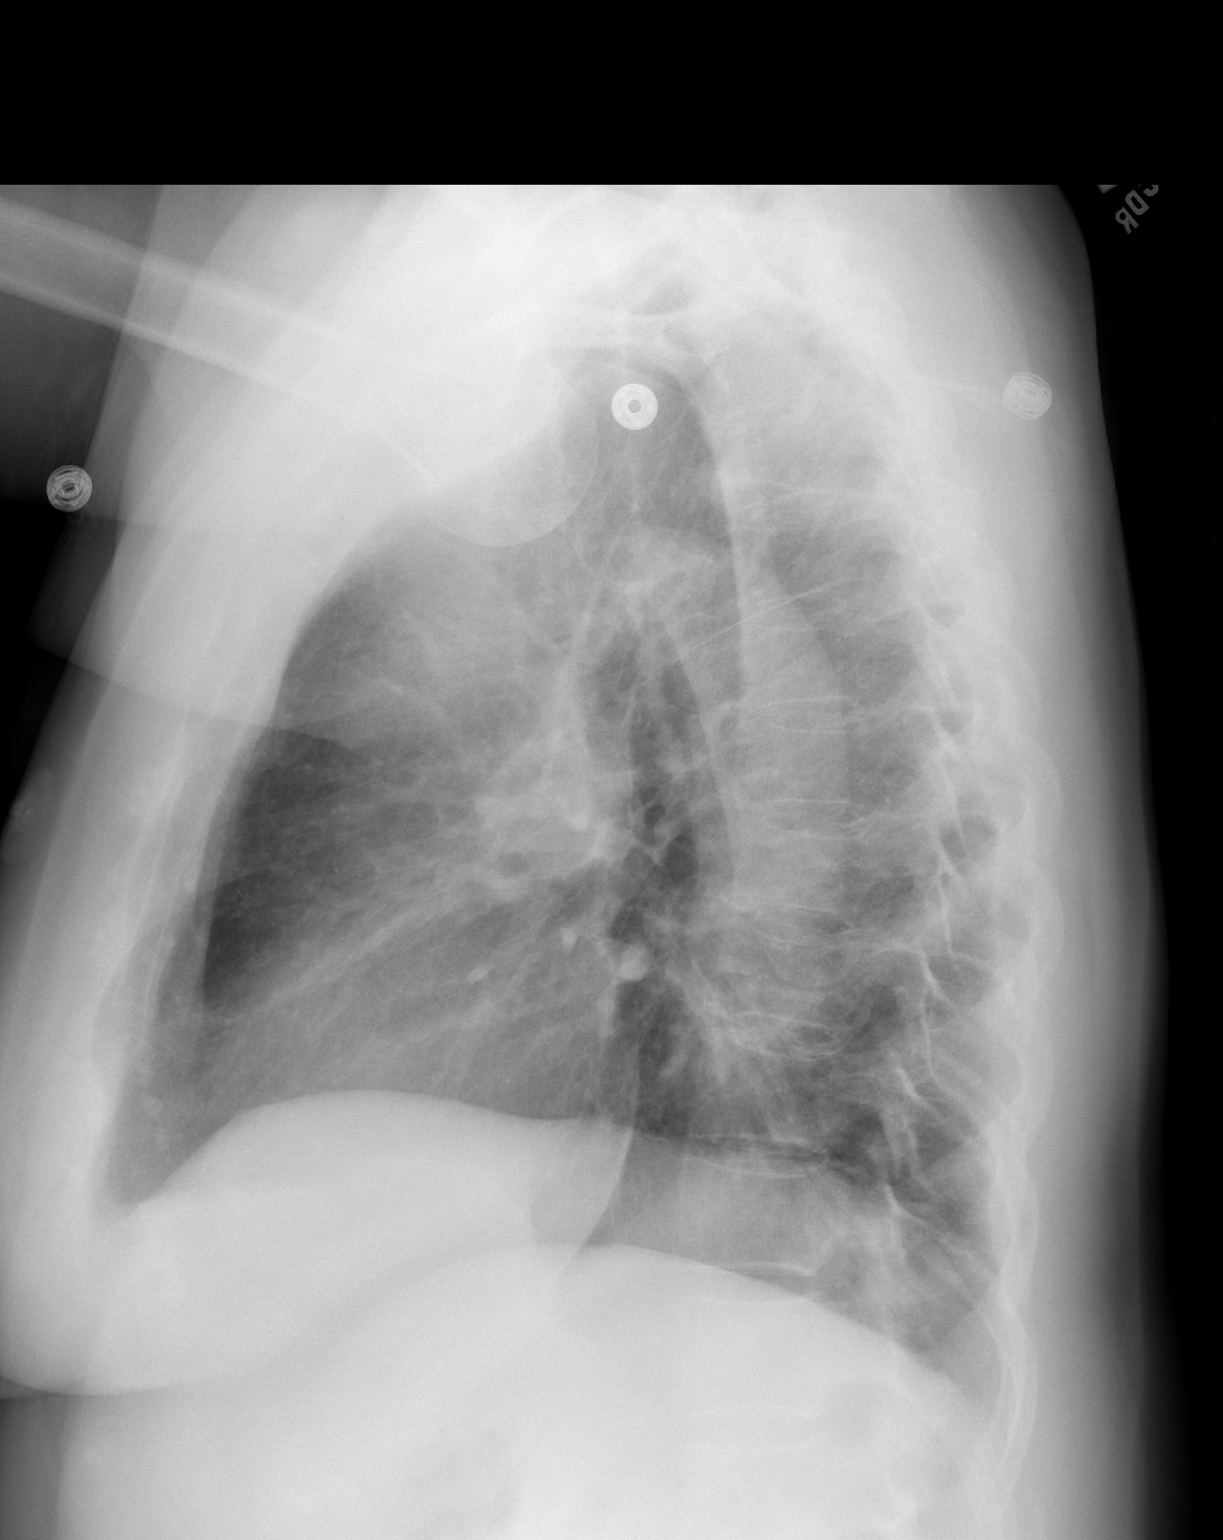

[2 of 2 positions shown; findings below may reference images not displayed]

FINDINGS: Heart and mediastinal contours normal.  There is
calcification in the transverse arch of the aorta.  Lungs clear.
No pleural fluid or osseous lesions.
IMPRESSION: No active cardiopulmonary disease.

## 2010-12-18 IMAGING — MR MR HEAD WO/W CM
13 of 20 series · 20 of 48 positions shown · IV contrast (multihance)
Comparison: CT 06/23/2009.  MRI 09/26/2008.

MRI HEAD

CLINICAL DATA: Possible stroke.  Headache.  Speech disturbance.

MRI HEAD WITHOUT AND WITH CONTRAST
MRA HEAD WITHOUT CONTRAST
MRA NECK WITHOUT AND WITH CONTRAST
TECHNIQUE: Multiplanar, multiecho pulse sequences of the brain and
surrounding structures were obtained according to standard protocol
without and with intravenous contrast.  Angiographic images of the
Circle of Willis were obtained using MRA technique without
intravenous contrast.  Angiographic images of the neck were
obtained using MRA technique without and with intravenous contrast.
Carotid stenosis measurements (when applicable) are obtained
utilizing NASCET criteria, using the distal internal carotid
diameter as the denominator.
Contrast: 18 ml Multihance IV

[Series 3: (id) mt fs · axial · 1.6mm · 0.45mm/px · z∈[-37,+69]mm · 4 of 131 slices shown]
[im 1/131]
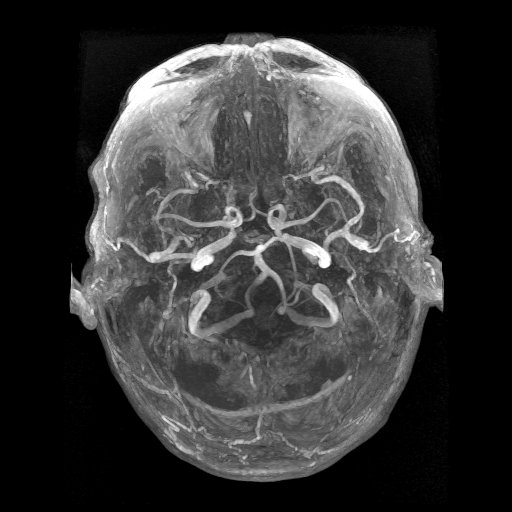
[im 44/131]
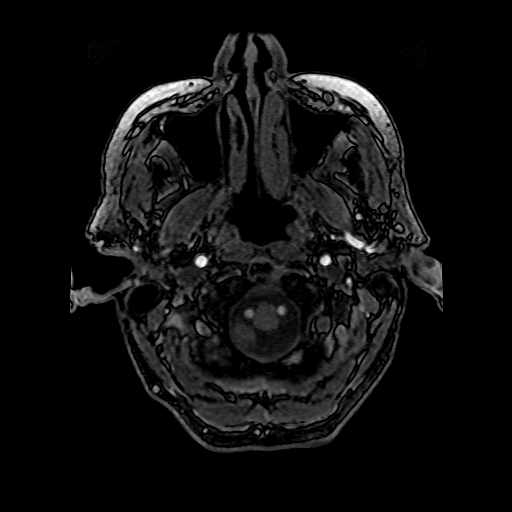
[im 87/131]
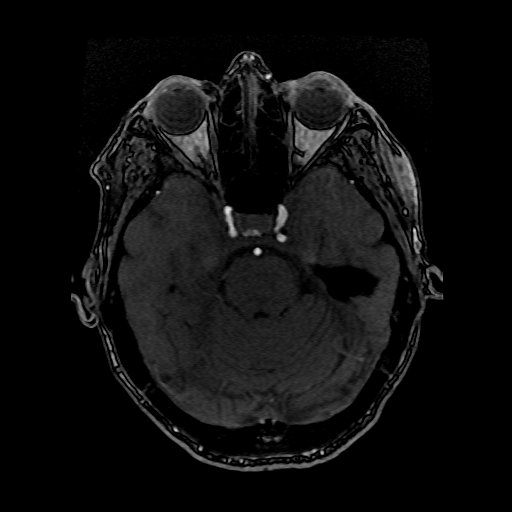
[im 131/131]
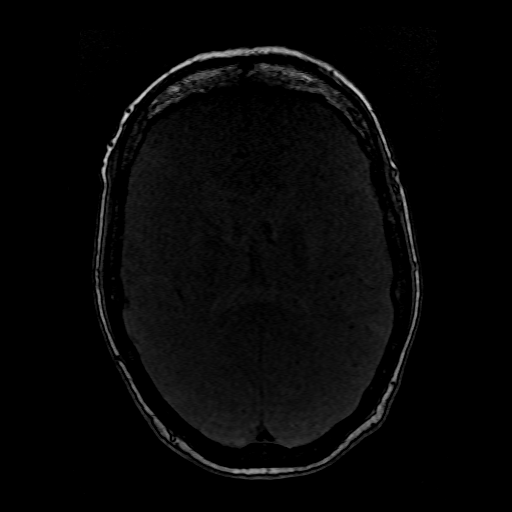

[Series 4: T1 · sagittal · 5.0mm · 0.47mm/px · 1 of 12 slices shown (1 of 2)]
[im 1/12]
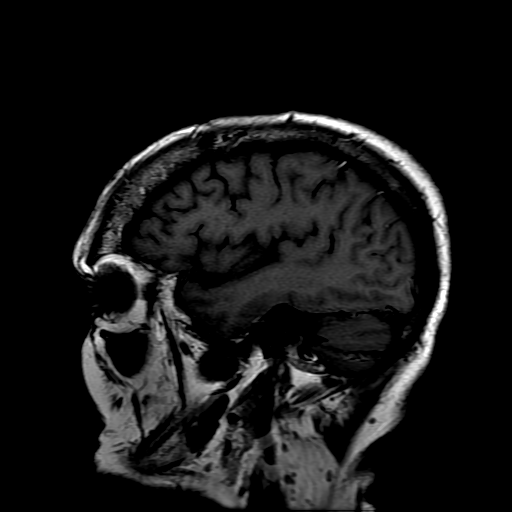

[Series 5: DWI · axial · 5.0mm · 1.09mm/px · 1 of 56 slices shown (1 of 3)]
[im 1/56]
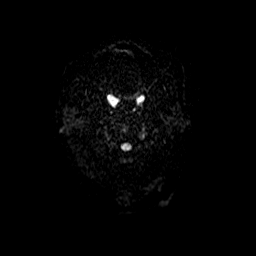

[Series 6: FLAIR · axial · 5.0mm · 0.47mm/px · 1 of 23 slices shown]
[im 1/23]
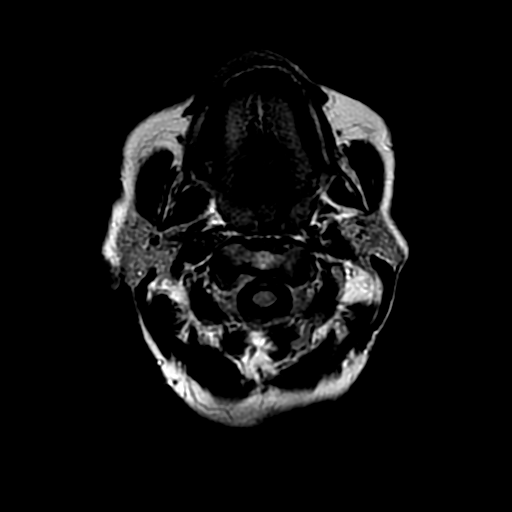

[Series 7: T2 · axial · 5.0mm · 0.43mm/px · 1 of 23 slices shown (1 of 2)]
[im 1/23]
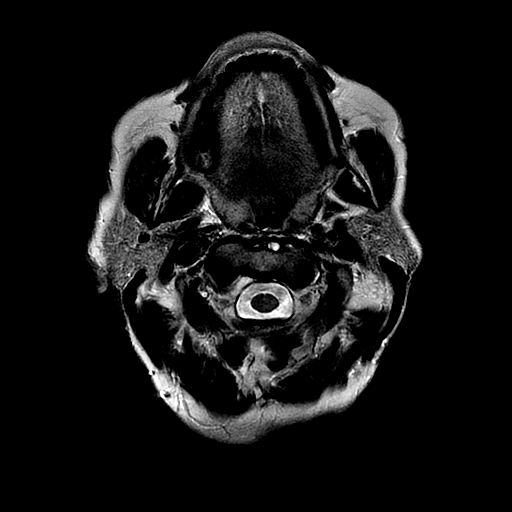

[Series 8: T2-star · axial · 5.0mm · 0.43mm/px · 1 of 23 slices shown]
[im 1/23]
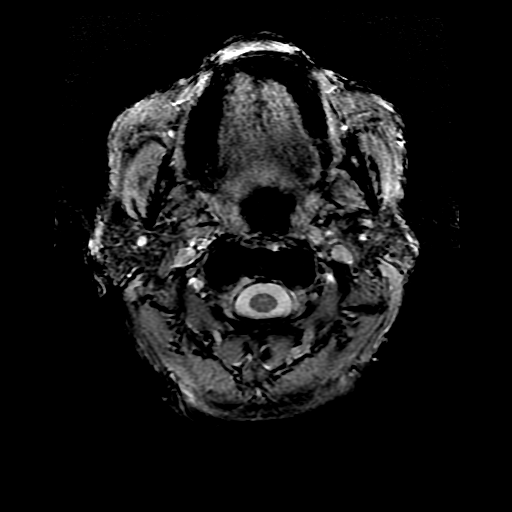

[Series 9: T2 · coronal · 5.0mm · 0.39mm/px · 1 of 24 slices shown (2 of 2)]
[im 1/24]
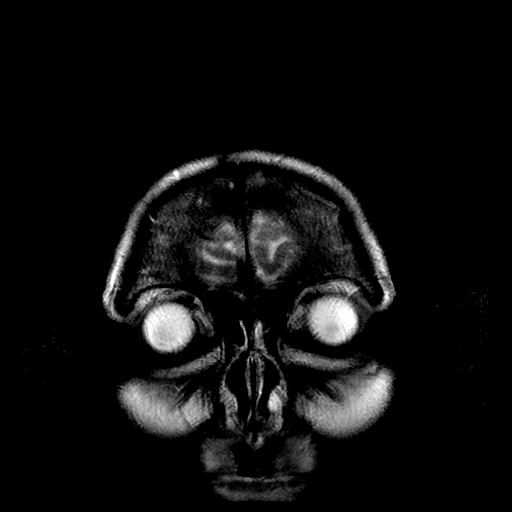

[Series 12: (id) neck · axial · 2.8mm · 0.47mm/px · z∈[-173,+44]mm · 5 of 160 slices shown]
[im 1/160]
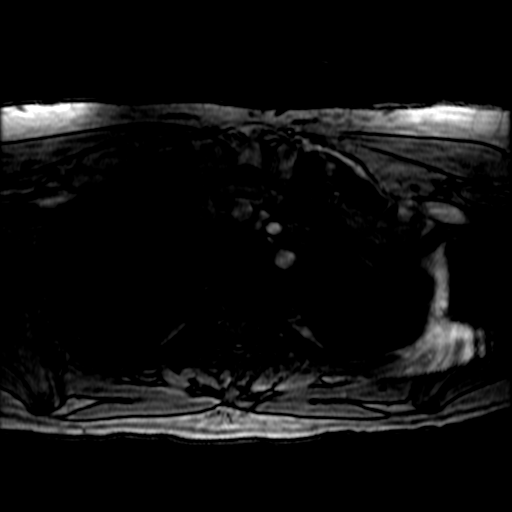
[im 40/160]
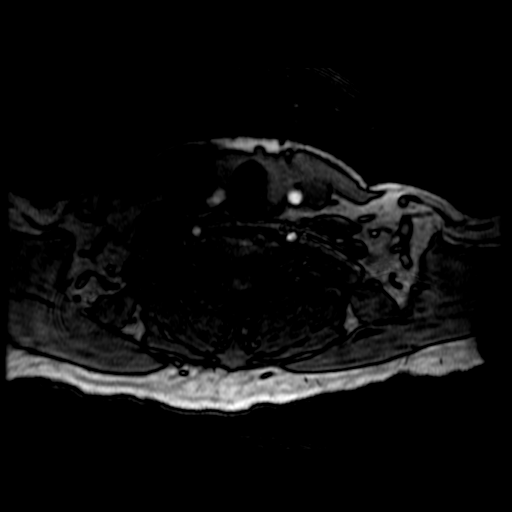
[im 80/160]
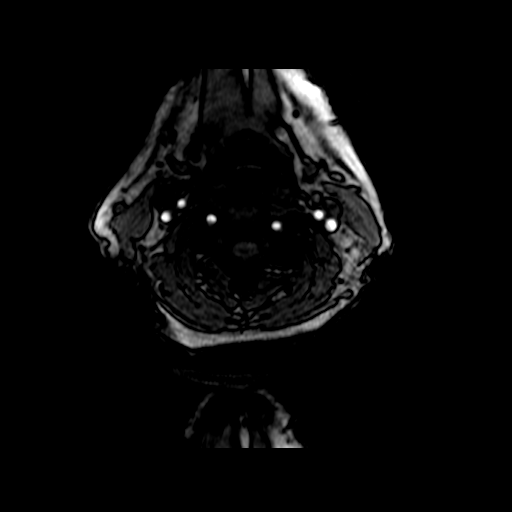
[im 120/160]
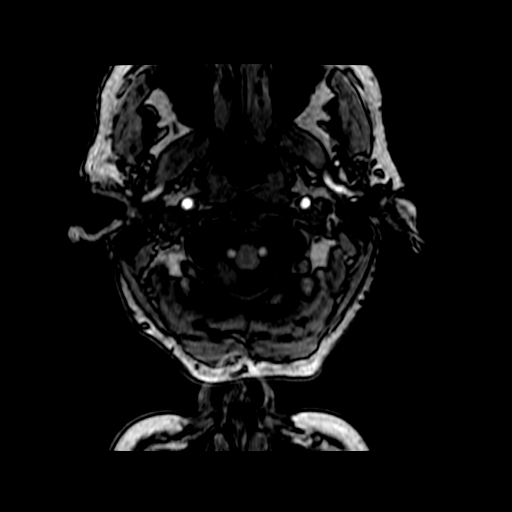
[im 160/160]
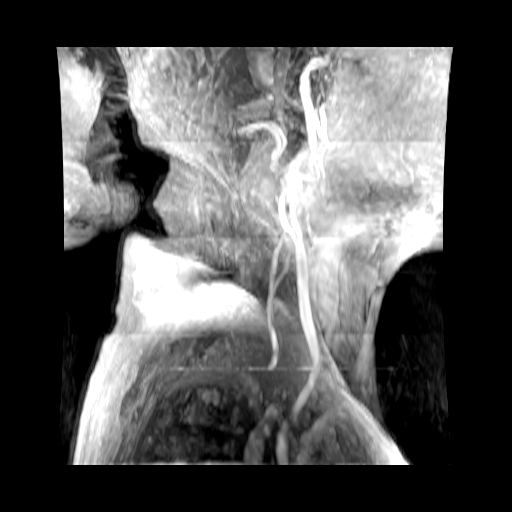

[Series 16: T1 · coronal · 5.0mm · 0.43mm/px · 1 of 26 slices shown (2 of 2)]
[im 1/26]
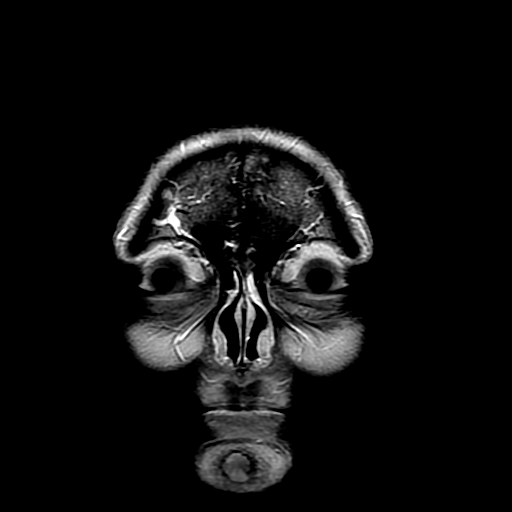

[Series 308: pjn · axial · 1.6mm · 0.45mm/px · 1 of 1 slices shown]
[im 1/1]
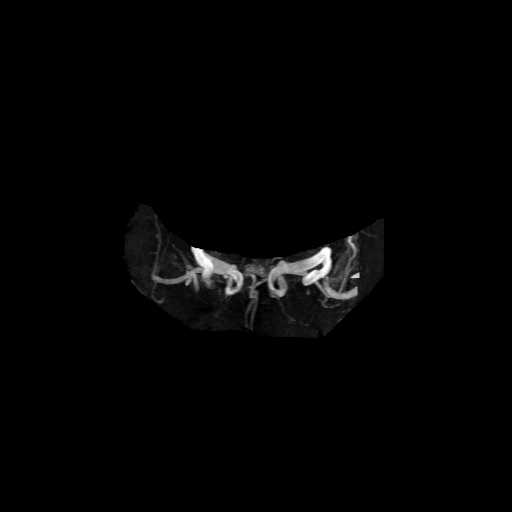

[Series 500: DWI · axial · 5.0mm · 1.09mm/px · 1 of 28 slices shown (2 of 3)]
[im 1/28]
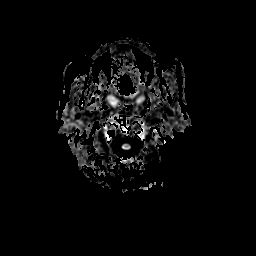

[Series 501: DWI · axial · 5.0mm · 1.09mm/px · 1 of 28 slices shown (3 of 3)]
[im 1/28]
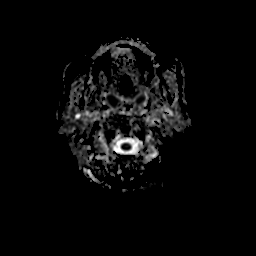

[Series 1300: cor cemra ft · coronal · 1.4mm · 0.59mm/px · 1 of 100 slices shown]
[im 1/100]
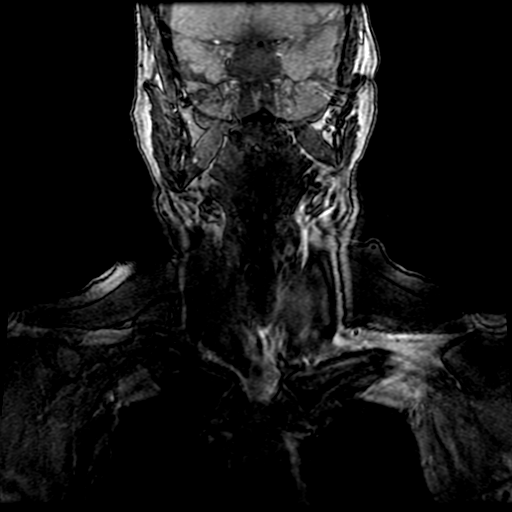

[20 of 48 positions shown; findings below may reference images not displayed]

FINDINGS: Negative for acute infarct.  Chronic ischemic changes
are present in the white matter.  The largest white matter infarct
is in the left frontal temporal lobe and is unchanged.  There is
chronic microvascular ischemia in the pons bilaterally.

Ventricle size is normal.  Negative for intracranial hemorrhage.
There is no mass or edema.  Following contrast infusion, the
enhancement pattern is normal.
IMPRESSION: No acute intracranial abnormality.

Chronic microvascular ischemia.

MRA HEAD
FINDINGS: Due to patient positioning, the intracranial branches are
not fully evaluated on this study.

Both vertebral arteries are patent to the basilar.  The basilar and
cerebellar arteries are patent.  The posterior cerebral arteries
are patent bilaterally.

There is mild stenosis of the cavernous carotid bilaterally.  The
anterior and middle cerebral arteries are patent proximally.

Negative for cerebral aneurysm.
IMPRESSION: Mild stenosis of the cavernous carotid bilaterally.  No large
vessel occlusion or stenosis.

MRA NECK
FINDINGS: Mild atherosclerotic plaque at the right internal carotid
artery origin.  This narrows the lumen by approximately 30%
diameter stenosis.  There is also mild irregularity and stenosis in
the mid cervical carotid.  This is similar to the prior MRA of
09/26/2008. There is mild stenosis of the right cavernous carotid.

The left carotid bifurcation shows mild atherosclerotic disease.
There is a smooth plaque involving the proximal external and
internal carotid artery.  The proximal left internal carotid artery
is narrowed by 15% diameter stenosis.  There is mild stenosis of
the mid cervical carotid and cavernous carotid on the left.

 Both vertebral arteries are patent to the basilar without
significant stenosis.
IMPRESSION: There is mild atherosclerotic disease of the right carotid
bifurcation without significant carotid stenosis.  The mid cervical
carotid also is irregular contains mild stenosis which is likely
due to atherosclerotic disease.  There is mild stenosis in the
right cavernous carotid.

Mild atherosclerotic disease involving the left carotid
bifurcation.  There is also mild disease in the mid cervical
carotid and the cavernous carotid on the left.

Both vertebral arteries are patent to the basilar.

## 2010-12-30 LAB — GLUCOSE, CAPILLARY
Glucose-Capillary: 111 — ABNORMAL HIGH
Glucose-Capillary: 111 — ABNORMAL HIGH
Glucose-Capillary: 114 — ABNORMAL HIGH
Glucose-Capillary: 133 — ABNORMAL HIGH
Glucose-Capillary: 135 — ABNORMAL HIGH
Glucose-Capillary: 137 — ABNORMAL HIGH
Glucose-Capillary: 147 — ABNORMAL HIGH
Glucose-Capillary: 148 — ABNORMAL HIGH
Glucose-Capillary: 149 — ABNORMAL HIGH
Glucose-Capillary: 157 — ABNORMAL HIGH

## 2010-12-30 LAB — BASIC METABOLIC PANEL
BUN: 7
BUN: 8
CO2: 29
CO2: 29
Calcium: 8.3 — ABNORMAL LOW
Calcium: 8.6
Chloride: 102
Chloride: 99
Creatinine, Ser: 0.82
Creatinine, Ser: 1
GFR calc Af Amer: >60
GFR calc Af Amer: >60
GFR calc non Af Amer: 55 — ABNORMAL LOW
GFR calc non Af Amer: >60
Glucose, Bld: 117 — ABNORMAL HIGH
Glucose, Bld: 126 — ABNORMAL HIGH
Potassium: 4.4
Potassium: 4.5
Sodium: 134 — ABNORMAL LOW
Sodium: 134 — ABNORMAL LOW

## 2010-12-30 LAB — CBC
HCT: 28.1 — ABNORMAL LOW
HCT: 29.2 — ABNORMAL LOW
HCT: 31.9 — ABNORMAL LOW
Hemoglobin: 10.7 — ABNORMAL LOW
Hemoglobin: 9.4 — ABNORMAL LOW
Hemoglobin: 9.9 — ABNORMAL LOW
MCHC: 33.3
MCHC: 33.6
MCHC: 33.9
MCV: 90.1
MCV: 91.2
MCV: 91.6
Platelets: 150
Platelets: 154
Platelets: 160
RBC: 3.06 — ABNORMAL LOW
RBC: 3.24 — ABNORMAL LOW
RBC: 3.5 — ABNORMAL LOW
RDW: 13.4
RDW: 13.4
RDW: 13.8
WBC: 6.5
WBC: 7.4
WBC: 7.8

## 2010-12-30 LAB — BASIC METABOLIC PANEL WITH GFR
BUN: 6
CO2: 31
Calcium: 8.5
Chloride: 98
Creatinine, Ser: 1.01
GFR calc non Af Amer: 54 — ABNORMAL LOW
Glucose, Bld: 109 — ABNORMAL HIGH
Potassium: 4.2
Sodium: 135

## 2010-12-30 LAB — PROTIME-INR
INR: 1.1
INR: 1.9 — ABNORMAL HIGH
INR: 3 — ABNORMAL HIGH
Prothrombin Time: 14.9
Prothrombin Time: 23.2 — ABNORMAL HIGH
Prothrombin Time: 33.1 — ABNORMAL HIGH

## 2010-12-30 LAB — TYPE AND SCREEN
ABO/RH(D): A POS
Antibody Screen: POSITIVE

## 2011-01-01 IMAGING — CR DG CHEST 1V PORT
1 series · 1 of 1 positions shown · non-contrast
Comparison: Chest x-ray of 06/23/2009

CLINICAL DATA: Chest pain, hypertension, diabetes

PORTABLE CHEST - 1 VIEW

[view not recorded]
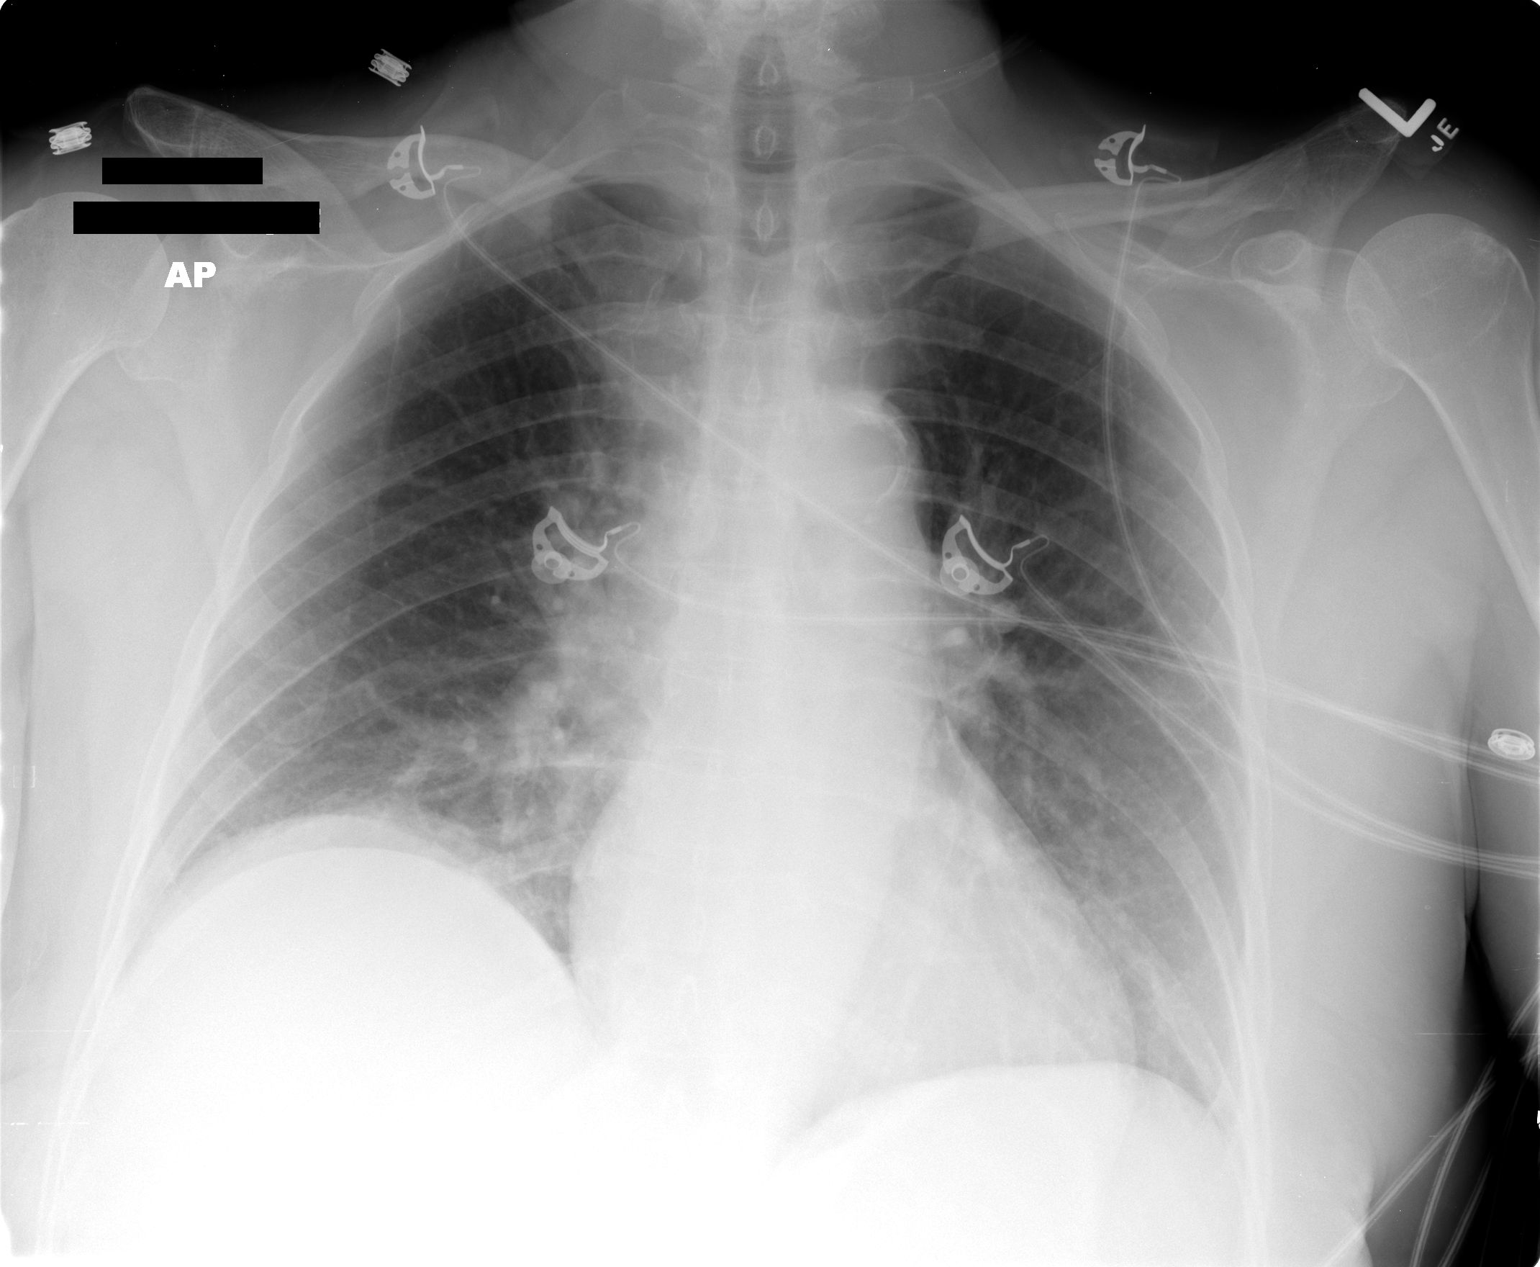

[1 of 1 positions shown; findings below may reference images not displayed]

FINDINGS: There is minimal haziness at the right lung base probably
representing respiratory motion.  Vague patchy area of pneumonia
cannot be excluded.  The left lung is clear.  The heart is within
upper limits of normal.
IMPRESSION: Haziness at the right lung base may be related to respiratory
motion with atelectasis or pneumonia difficult to exclude.
Consider repeat two-view chest x-ray if necessary clinically.

## 2011-01-19 ENCOUNTER — Other Ambulatory Visit (HOSPITAL_COMMUNITY): Payer: Self-pay | Admitting: Internal Medicine

## 2011-01-19 ENCOUNTER — Ambulatory Visit (HOSPITAL_COMMUNITY)
Admission: RE | Admit: 2011-01-19 | Discharge: 2011-01-19 | Disposition: A | Discharge status: Home / Self Care | Payer: Medicare Other | Source: Ambulatory Visit | Attending: Internal Medicine | Admitting: Internal Medicine

## 2011-01-19 DIAGNOSIS — R0602 Shortness of breath: Secondary | ICD-10-CM

## 2011-01-19 DIAGNOSIS — Z139 Encounter for screening, unspecified: Secondary | ICD-10-CM

## 2011-01-25 ENCOUNTER — Ambulatory Visit (HOSPITAL_COMMUNITY)
Admission: RE | Admit: 2011-01-25 | Discharge: 2011-01-25 | Disposition: A | Discharge status: Home / Self Care | Payer: Medicare Other | Source: Ambulatory Visit | Attending: Internal Medicine | Admitting: Internal Medicine

## 2011-01-25 DIAGNOSIS — M899 Disorder of bone, unspecified: Secondary | ICD-10-CM | POA: Insufficient documentation

## 2011-01-25 DIAGNOSIS — M949 Disorder of cartilage, unspecified: Secondary | ICD-10-CM | POA: Insufficient documentation

## 2011-01-25 DIAGNOSIS — Z13820 Encounter for screening for osteoporosis: Secondary | ICD-10-CM | POA: Insufficient documentation

## 2011-01-25 DIAGNOSIS — Z780 Asymptomatic menopausal state: Secondary | ICD-10-CM | POA: Insufficient documentation

## 2011-01-25 DIAGNOSIS — Z139 Encounter for screening, unspecified: Secondary | ICD-10-CM

## 2011-03-17 ENCOUNTER — Other Ambulatory Visit (HOSPITAL_COMMUNITY): Payer: Self-pay | Admitting: Internal Medicine

## 2011-03-17 ENCOUNTER — Ambulatory Visit (HOSPITAL_COMMUNITY)
Admission: RE | Admit: 2011-03-17 | Discharge: 2011-03-17 | Disposition: A | Discharge status: Home / Self Care | Payer: Medicare Other | Source: Ambulatory Visit | Attending: Internal Medicine | Admitting: Internal Medicine

## 2011-03-17 DIAGNOSIS — R059 Cough, unspecified: Secondary | ICD-10-CM | POA: Insufficient documentation

## 2011-03-17 DIAGNOSIS — R05 Cough: Secondary | ICD-10-CM

## 2011-03-22 IMAGING — CR DG CHEST 2V
2 series · 2 of 2 positions shown · non-contrast
Comparison: 07/08/2009.  06/23/2009.

CLINICAL DATA: History of chest pain and weakness.  History of
dizziness and hypertension.  History of diabetes and previous
tobacco smoking.  History of heart stent.

CHEST - 2 VIEW

[w chest pa]
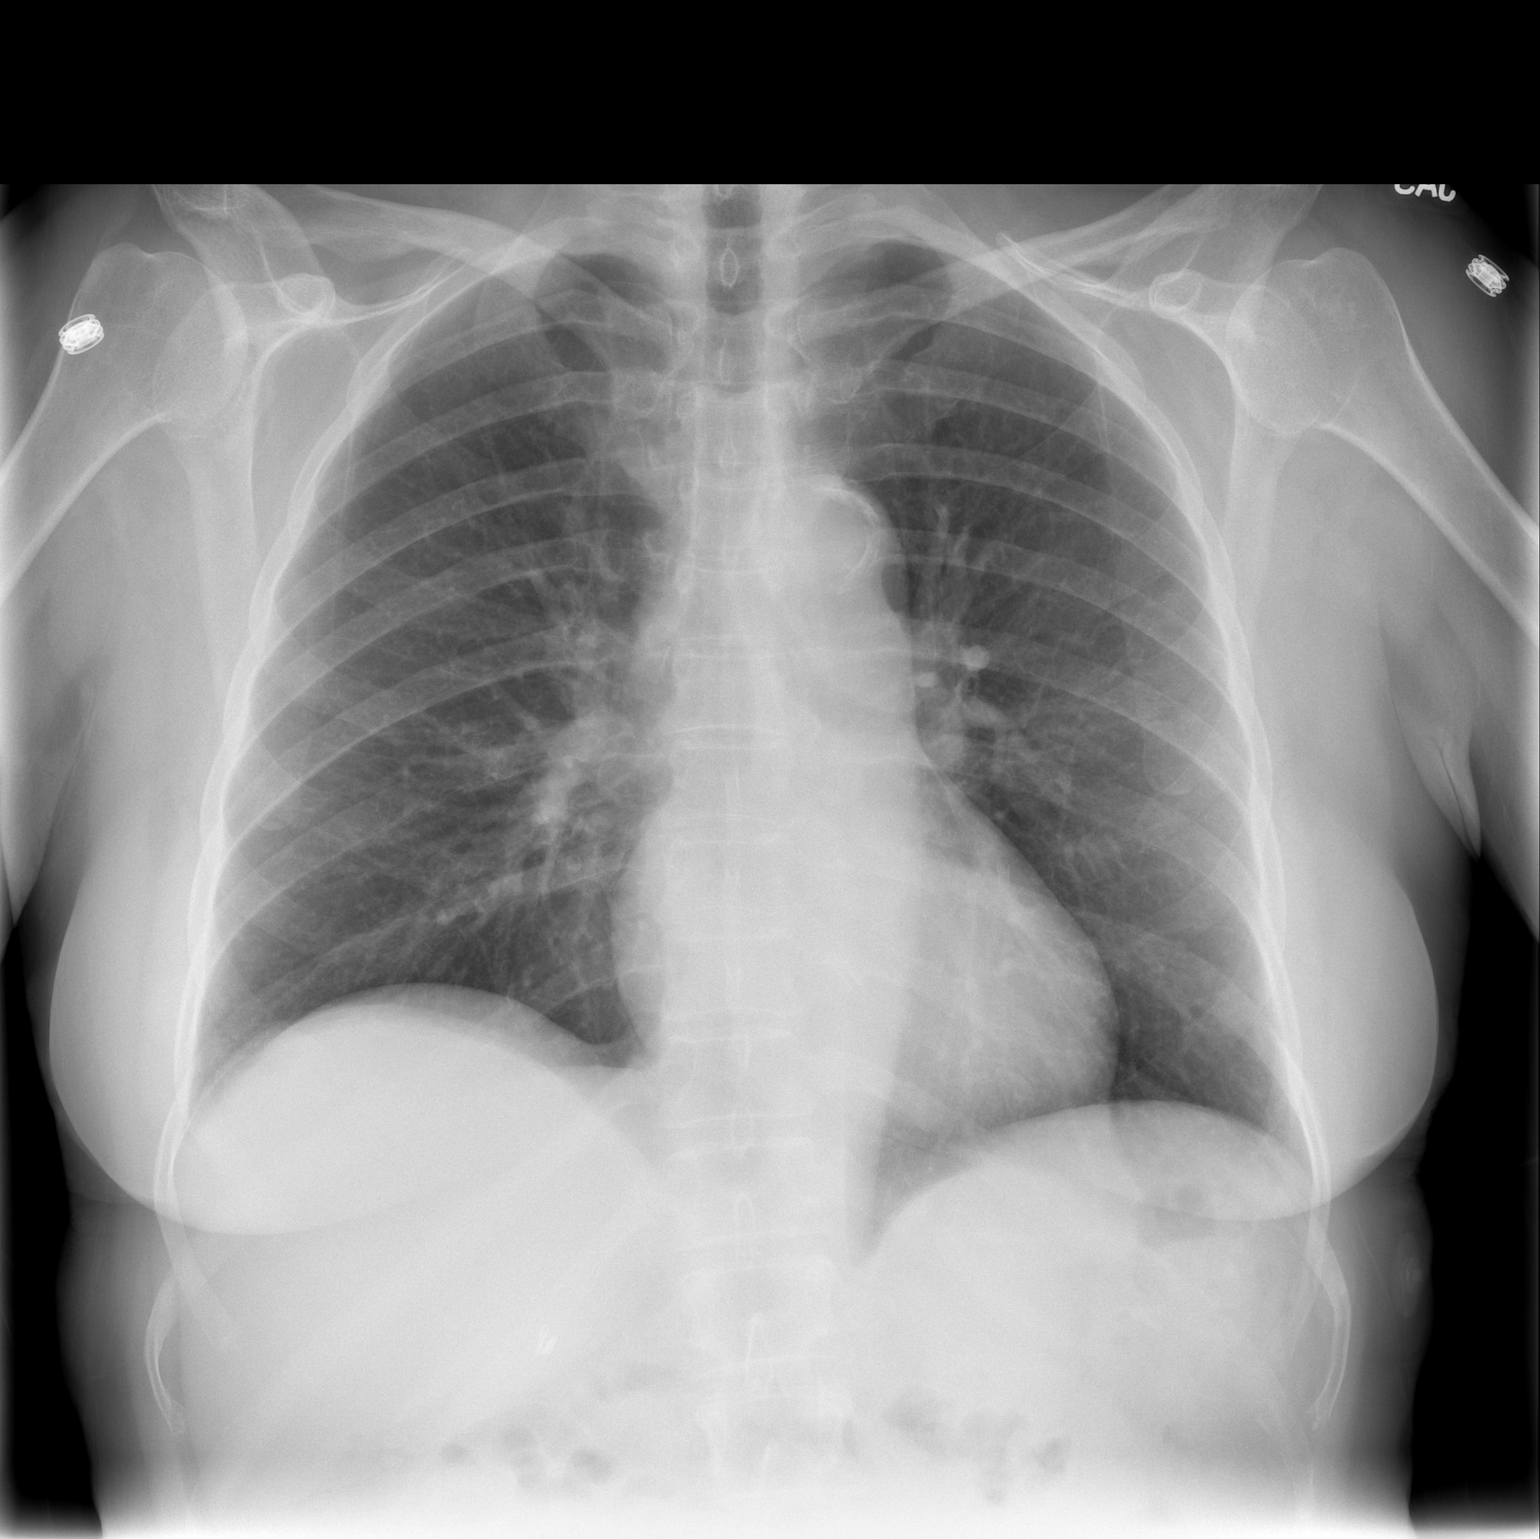

[w chest lat]
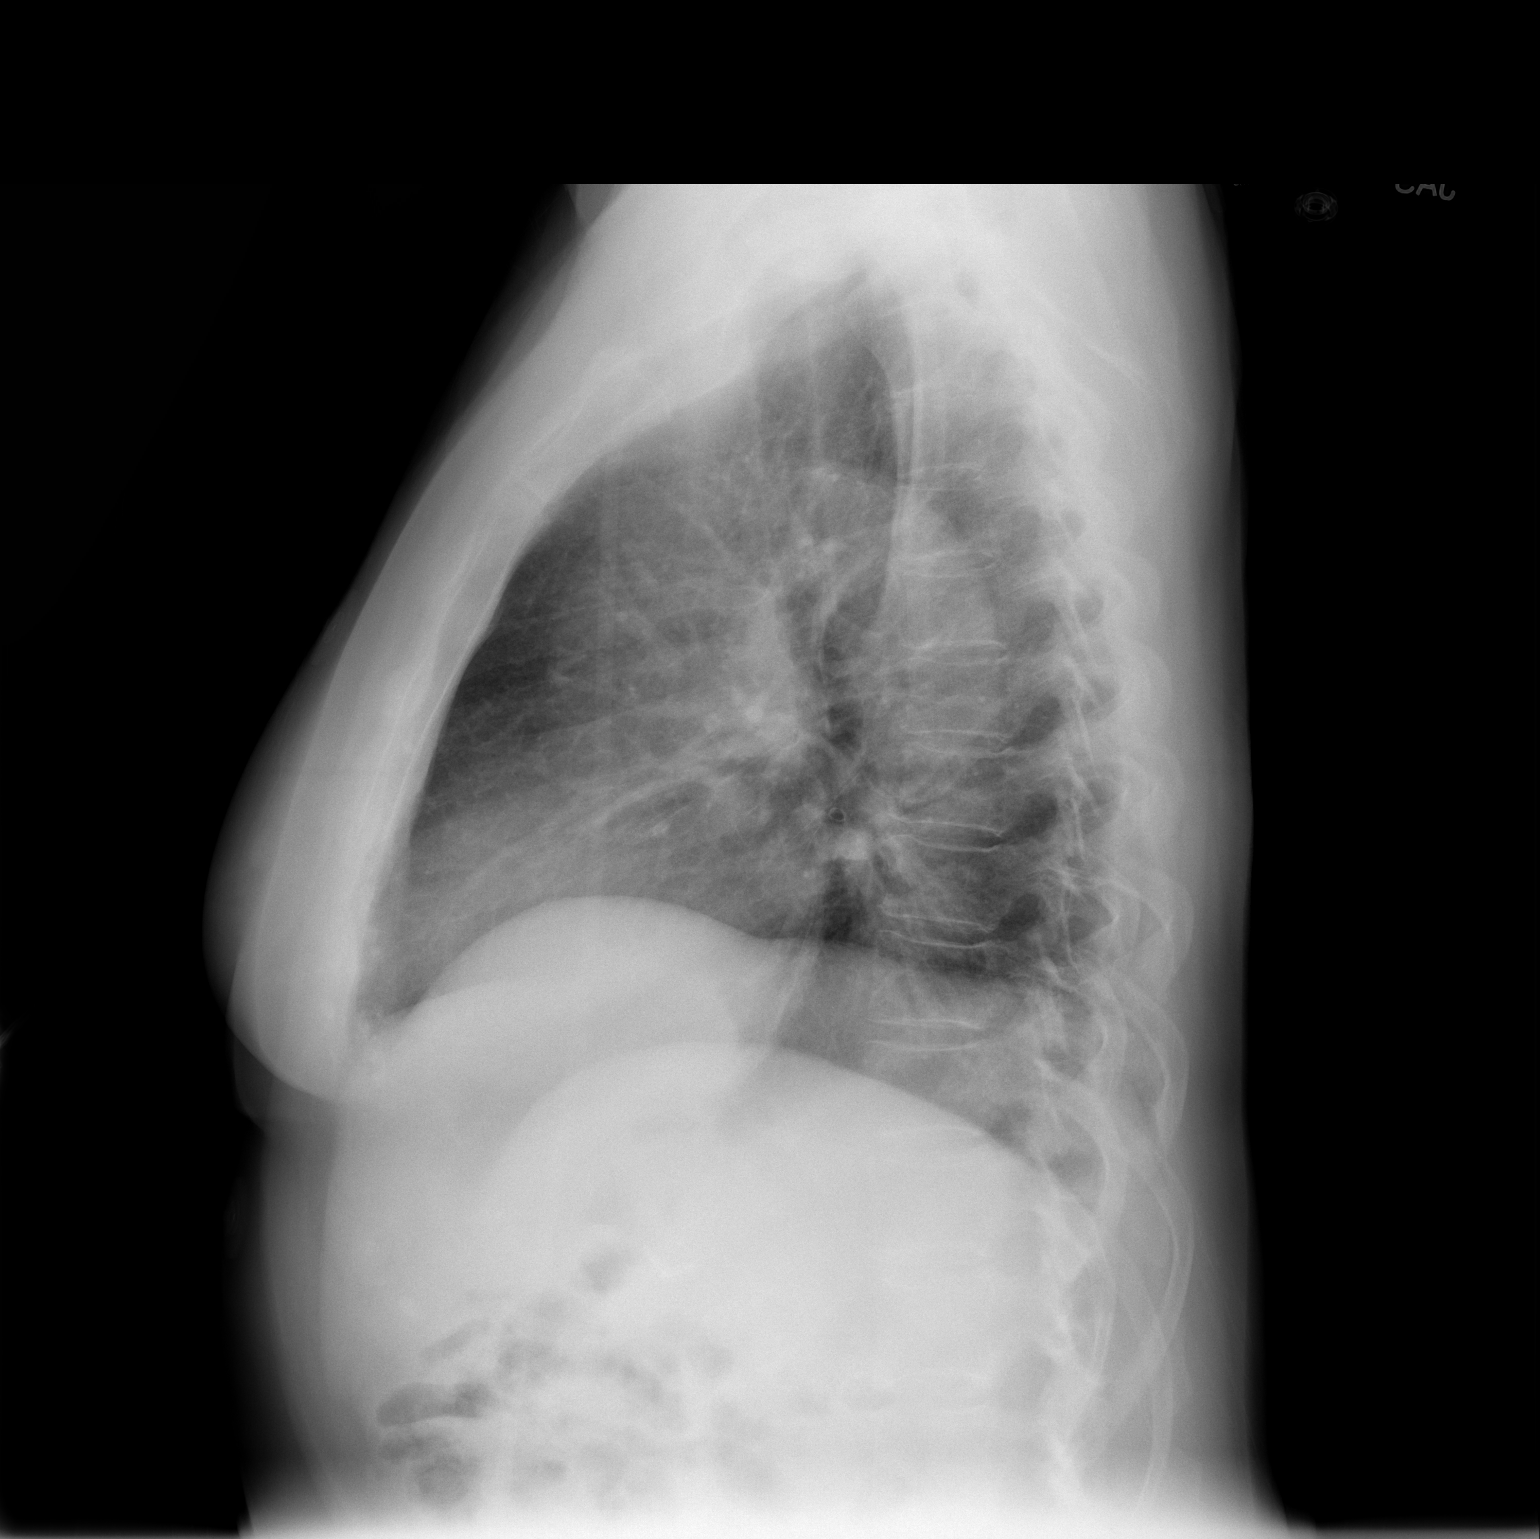

[2 of 2 positions shown; findings below may reference images not displayed]

FINDINGS: The cardiac silhouette is normal size and shape. Ectasia
and nonaneurysmal calcification of the thoracic aorta is seen. The
lungs are well aerated and free of infiltrates. No pleural
abnormality is evident. There is a mildly osteopenic appearance of
the bones.
IMPRESSION: No acute or active cardiopulmonary process is seen.

## 2011-03-22 IMAGING — MR MR MRA HEAD W/O CM
13 series · 16 of 16 positions shown · IV contrast (9     MULTIHANCE)
Comparison: 06/24/2009.  Head CT 09/26/2009.

MRI HEAD WITHOUT AND WITH CONTRAST

CLINICAL DATA: 69-year-old female with visual problems, weakness,
chest pain.

Contrast: 9 ml MultiHance.
TECHNIQUE: Multiplanar, multiecho pulse sequences of the brain and
surrounding structures were obtained according to standard protocol
without and with intravenous contrast.
TECHNIQUE: Angiographic images of the neck were obtained using MRA
technique without and with intravenous contrast.
TECHNIQUE: Angiographic images of the Circle of Willis were
obtained using MRA technique without  intravenous contrast.

[Series 3: T1 · sagittal · 5.0mm · 0.47mm/px · 1 of 12 slices shown (1 of 2)]
[im 1/12]
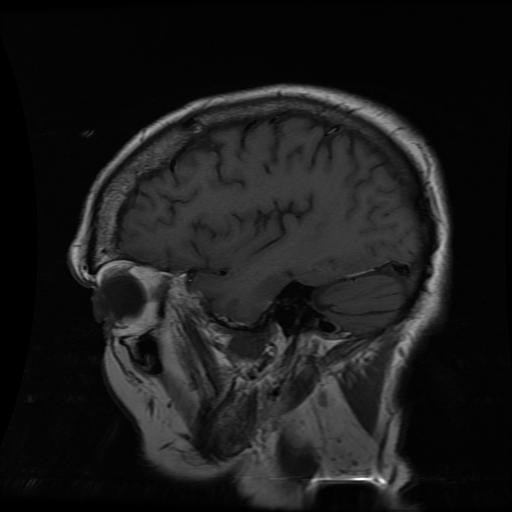

[Series 4: DWI · axial · 5.0mm · 1.09mm/px · 1 of 52 slices shown (1 of 2)]
[im 1/52]
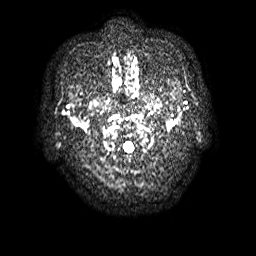

[Series 5: (id) mt fs · axial · 1.6mm · 0.45mm/px · z∈[-53,+49]mm · 2 of 130 slices shown]
[im 1/130]
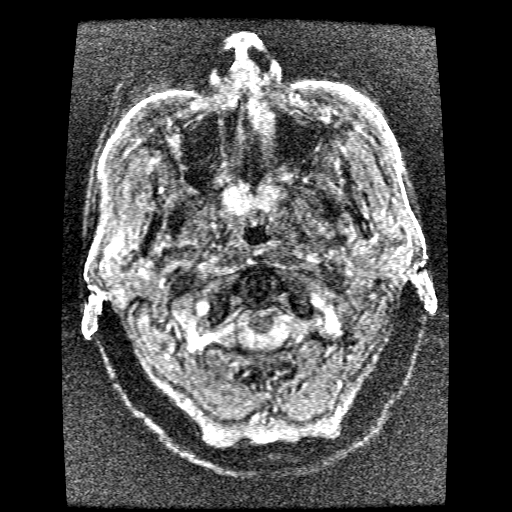
[im 130/130]
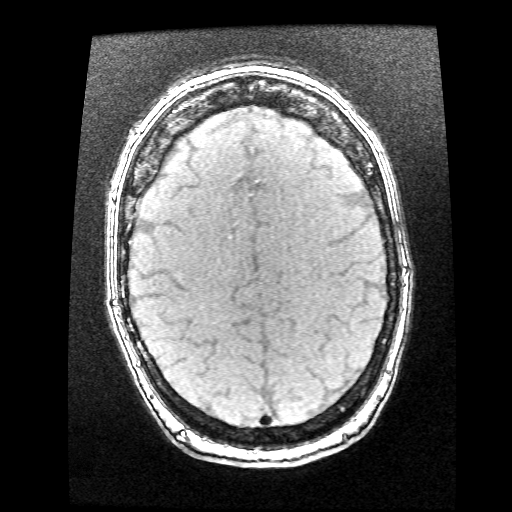

[Series 7: T2 · axial · 5.0mm · 0.47mm/px · 1 of 22 slices shown (1 of 2)]
[im 1/22]
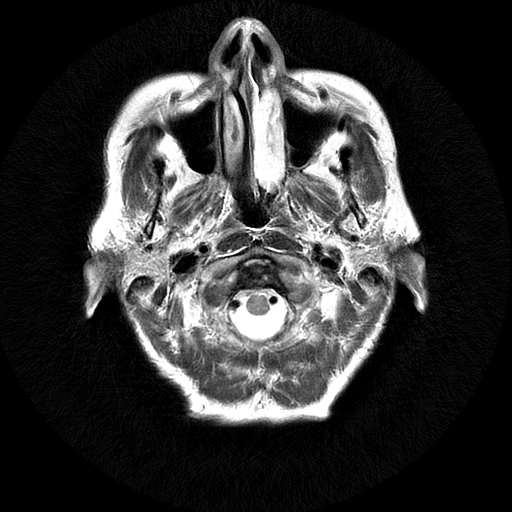

[Series 8: FLAIR · axial · 5.0mm · 0.47mm/px · 1 of 22 slices shown]
[im 1/22]
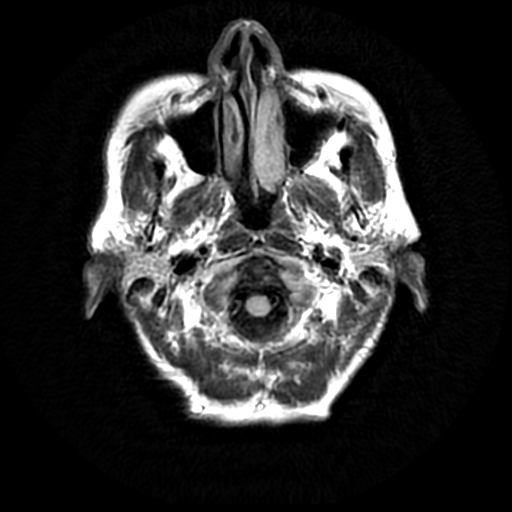

[Series 9: T2-star · axial · 5.0mm · 0.45mm/px · 1 of 25 slices shown]
[im 1/25]
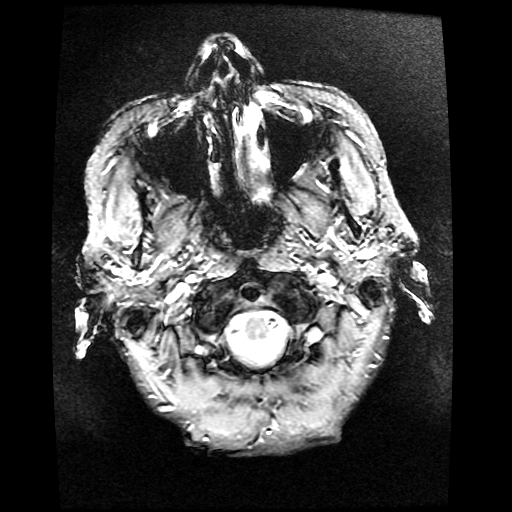

[Series 10: ax fspgr 3d · axial · non-contrast · 3.0mm · 0.43mm/px · 1 of 52 slices shown (1 of 2)]
[im 1/52]
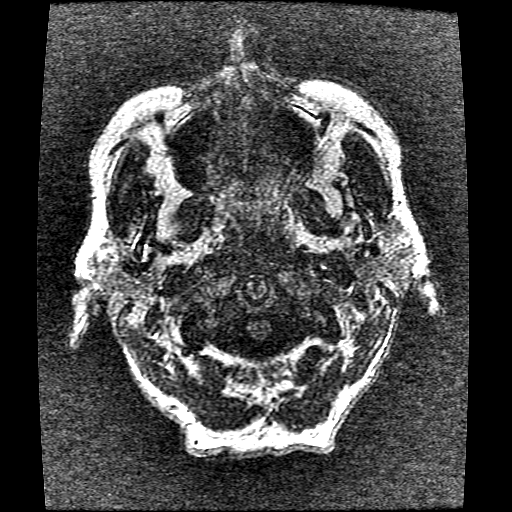

[Series 11: T2 · coronal · 5.0mm · 0.39mm/px · 1 of 28 slices shown (2 of 2)]
[im 1/28]
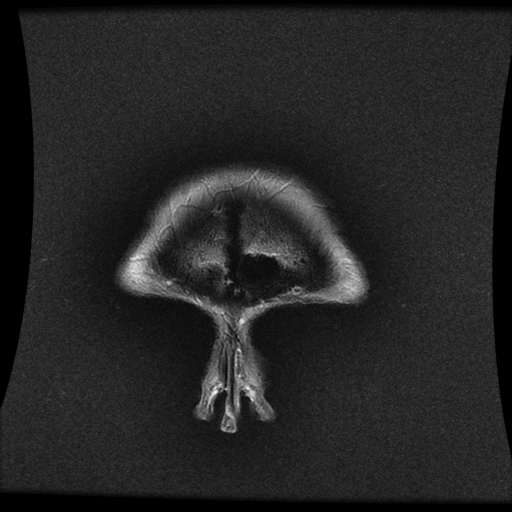

[Series 13: (id) neck · axial · 2.8mm · 0.47mm/px · z∈[-210,+15]mm · 2 of 160 slices shown]
[im 1/160]
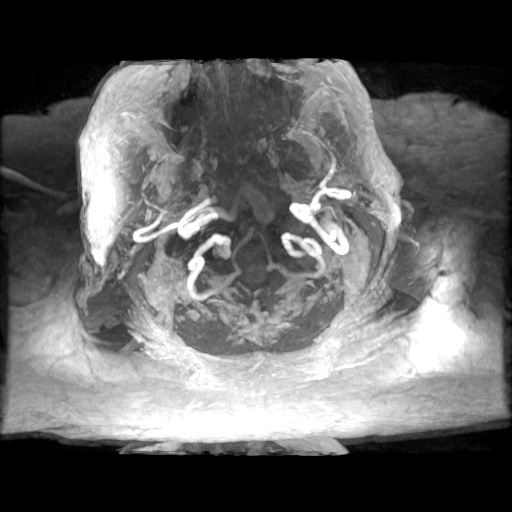
[im 160/160]
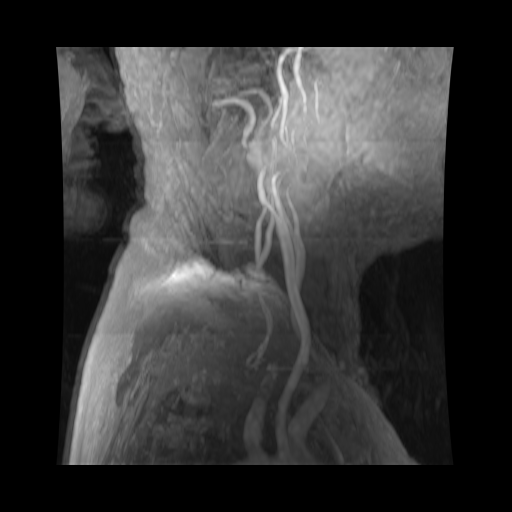

[Series 15: ax fspgr 3d · axial · 3.0mm · 0.43mm/px · 1 of 104 slices shown (2 of 2)]
[im 1/104]
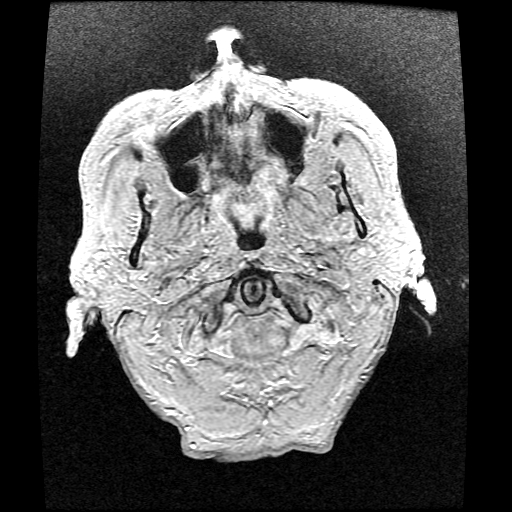

[Series 16: T1 · coronal · 5.0mm · 0.43mm/px · 1 of 26 slices shown (2 of 2)]
[im 1/26]
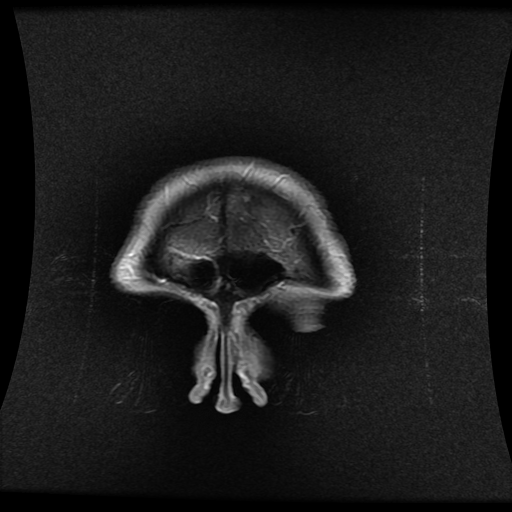

[Series 400: DWI · axial · 5.0mm · 1.09mm/px · 1 of 26 slices shown (2 of 2)]
[im 1/26]
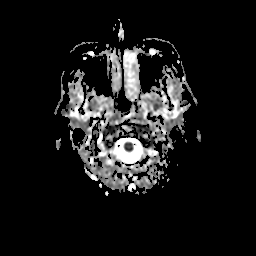

[((date))-((date)) · coronal · 1.4mm · 0.59mm/px · 2 of 100 slices shown]
[im 1/100]
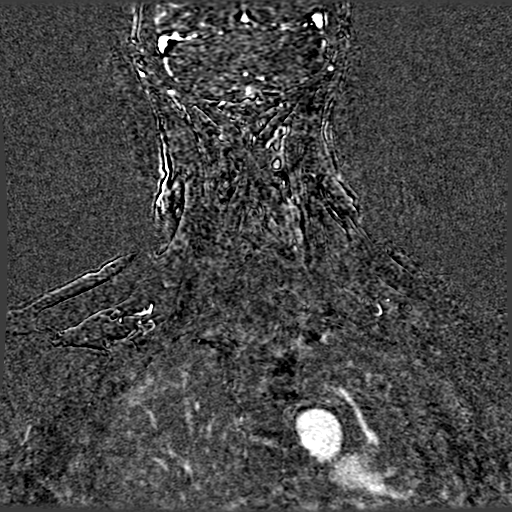
[im 100/100]
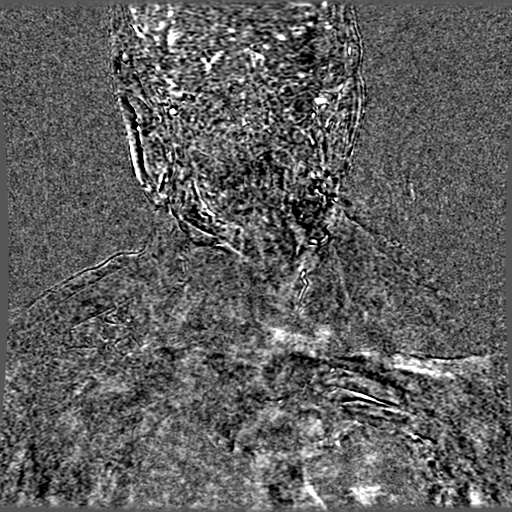

[16 of 16 positions shown; findings below may reference images not displayed]

FINDINGS: No restricted diffusion to suggest acute infarction.  No
midline shift, mass effect, evidence of mass lesion,
ventriculomegaly, extra-axial collection or acute intracranial
hemorrhage.  Cervicomedullary junction and pituitary are within
normal limits.  Major intracranial vascular flow voids are
preserved.

Stable patchy left greater than right cerebral white matter T2 and
FLAIR hyperintensity.  There has been some progression of the
similar signal abnormality in the pons.  Cerebellum and deep gray
matter nuclei remain spared. No abnormal enhancement identified.

Visualized bone marrow signal is within normal limits.  Mild
paranasal sinus inflammatory changes are increased.  Mastoids are
clear. Visualized scalp soft tissues are within normal limits.
Visualized cervical spine is normal for age.
IMPRESSION: 1. No acute intracranial abnormality.
2.  Stable to mild progression of cerebral and pontine white matter
signal abnormality compatible with small vessel ischemia.
3.  MRA findings are below.

MRA NECK WITHOUT AND WITH CONTRAST
FINDINGS: Time-of-flight images demonstrate a three-vessel arch
configuration and antegrade flow in both carotid and vertebral
arteries.  No flow gaps occur at the carotid bifurcations.

On postcontrast imaging, the bilateral common carotid artery
origins are within normal limits.  The right vertebral artery
origin is within normal limits.  There is stable tortuosity of the
left vertebral artery origin, with signal loss, where moderate to
severe stenosis cannot be excluded. Beyond this level, the
vertebral arteries are codominant and within normal limits to the
vertebrobasilar junction.

Stable right common carotid artery.  Stable appearance of the right
carotid bifurcation with mild irregularity and plaque at the right
ICA origin.  Stenosis is stable measuring 30 % with respect to the
distal vessel.  The there is moderate to severe tortuosity of the
cervical right ICA beyond this level with further irregularity
which could reflect atherosclerosis or at NHI, unchanged.

The left common carotid artery is stable and within normal limits.
The left carotid bifurcation is stable with mild plaque at the left
ICA origin resulting in stenosis of 15 % with respect to the distal
vessel.  Moderate tortuosity of the more distal cervical left ICA
is re-identified, also with a beaded appearance similar to the
right.
IMPRESSION: 1.  Stable neck MRA.
2.  Bilateral carotid bifurcation atherosclerosis without
hemodynamically significant stenosis.   Tortuosity and irregularity
of the bilateral more distal cervical ICAs, the latter could
reflect additional atherosclerosis or fibromuscular dysplasia.
3.  Severely tortuous left vertebral artery origin where stenosis
cannot be excluded.
4.  Intracranial findings are below.

MRA HEAD WITHOUT CONTRAST
FINDINGS: Antegrade flow in the posterior circulation with
codominant distal vertebral arteries.  Patent left PICA.  Probable
right AICA - PICA trunk.  Normal vertebrobasilar junction and
basilar artery.  Superior cerebellar arteries and PCA origins are
within normal limits.  Bilateral PCA branches are stable with mild
irregularity but no stenosis.  Bilateral posterior communicating
arteries are within normal limits aside from tortuosity, more so on
the left.

Antegrade flow in both ICA siphons.  Bilateral ophthalmic and
posterior communicating artery origins are within normal limits.
Carotid termini are stable within normal limits.  Anterior
communicating arteries diminutive or absent.  Other visualized ACA
branches are within normal limits.  MCA origins are within normal
limits.  There is mild irregularity throughout the visualized
bilateral MCA branches without focal stenosis or major branch
occlusion.
IMPRESSION: Stable intracranial MRA, with medium-sized vessel atherosclerosis.
No intracranial stenosis or major branch occlusion.

## 2011-03-22 IMAGING — CT CT HEAD W/O CM
1 series · 16 of 30 positions shown, 20 images · non-contrast
Comparison: 06/23/2009

CLINICAL DATA: Weakness

CT HEAD WITHOUT CONTRAST
TECHNIQUE: Contiguous axial images were obtained from the base of
the skull through the vertex without intravenous contrast.

[Series 2: head routine 4.8 h37s · axial · 0.43mm/px · z∈[-112,+16]mm · 16 of 30 slices shown, 20 images]
[im 2/30  brain]
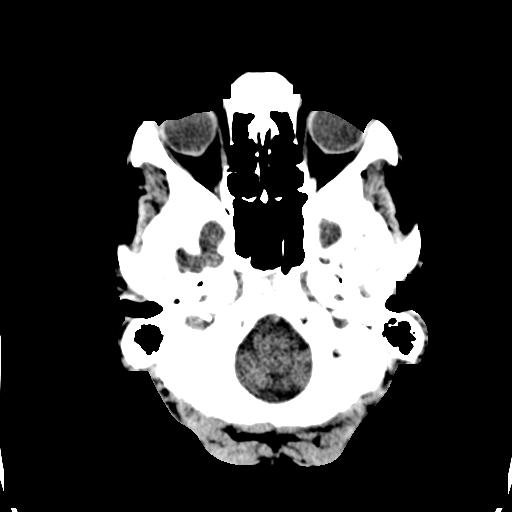
[im 2/30  bone]
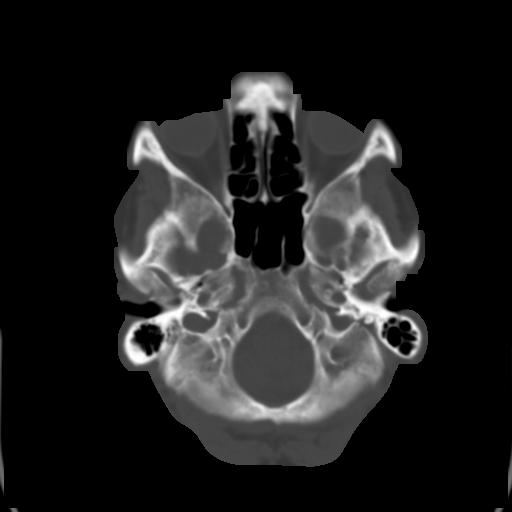
[im 4/30  brain]
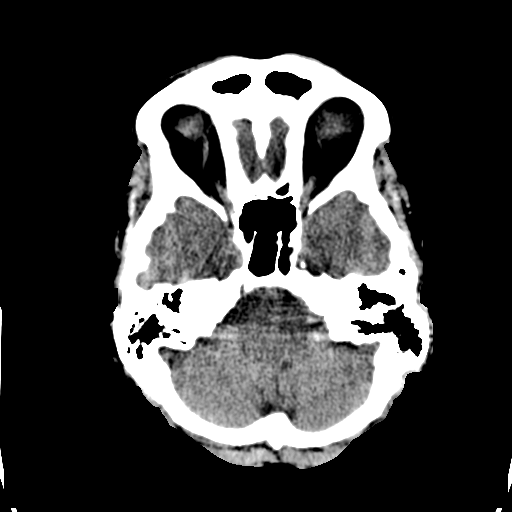
[im 6/30  brain]
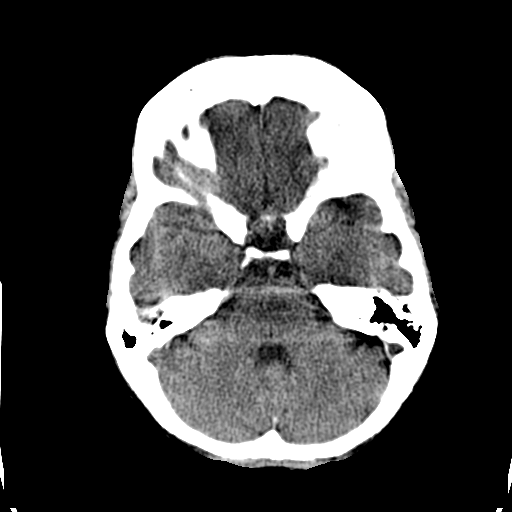
[im 8/30  brain]
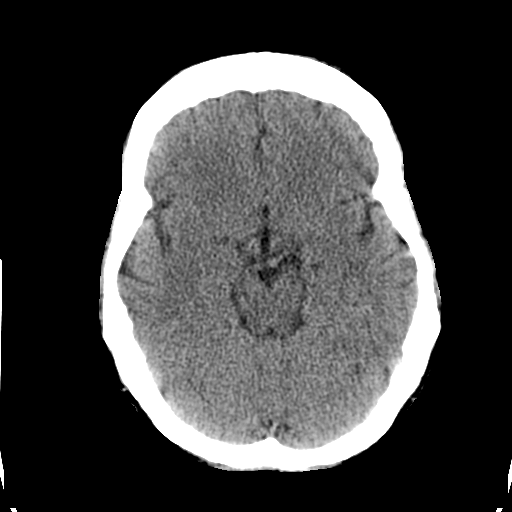
[im 9/30  brain]
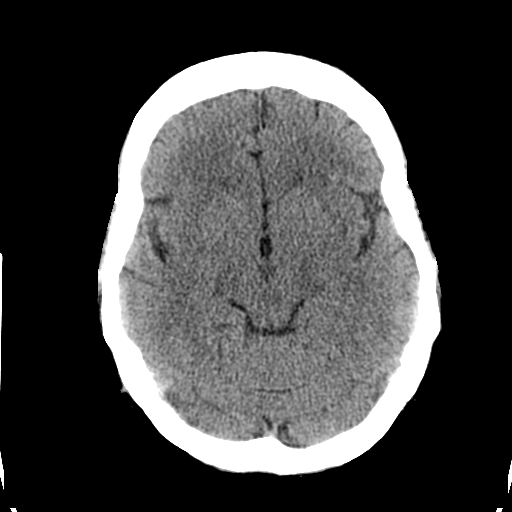
[im 9/30  bone]
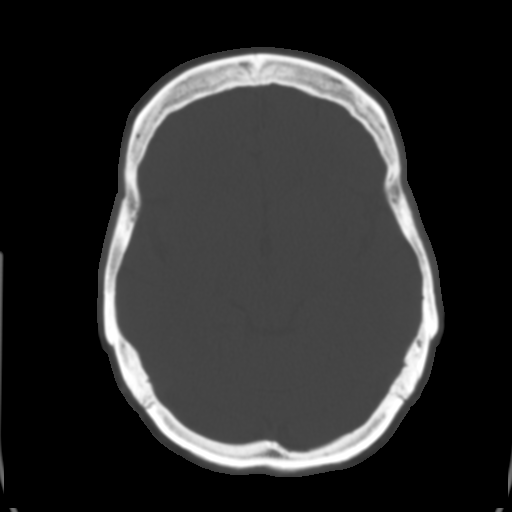
[im 11/30  brain]
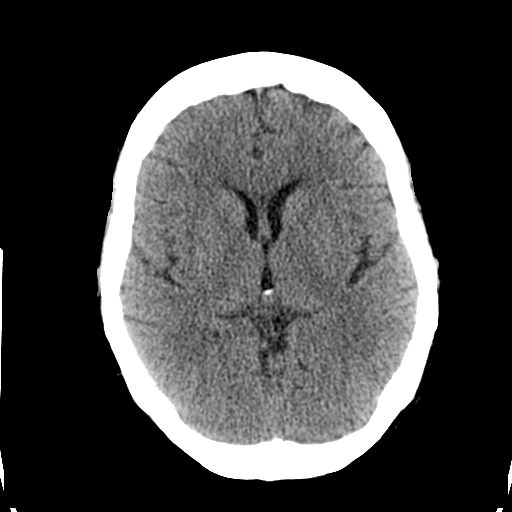
[im 13/30  brain]
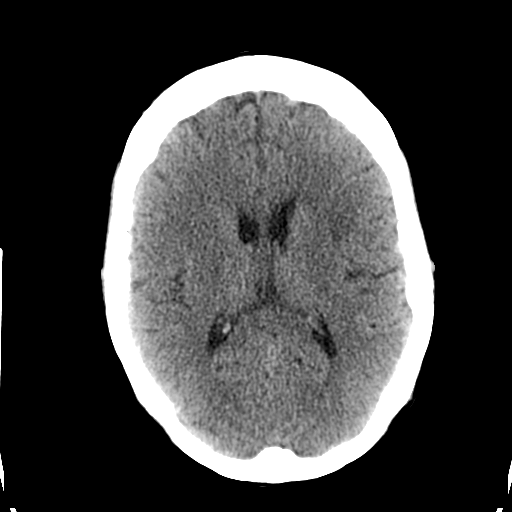
[im 15/30  brain]
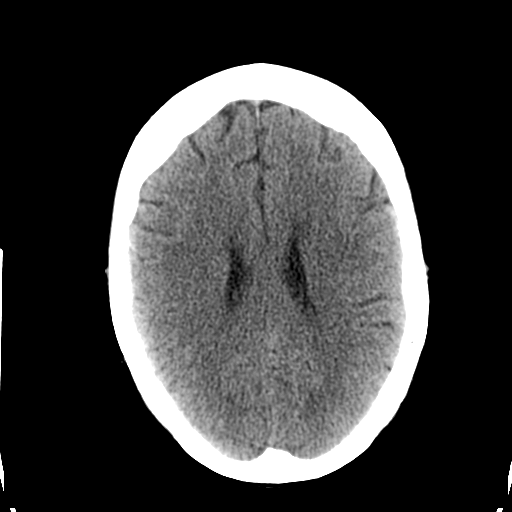
[im 16/30  brain]
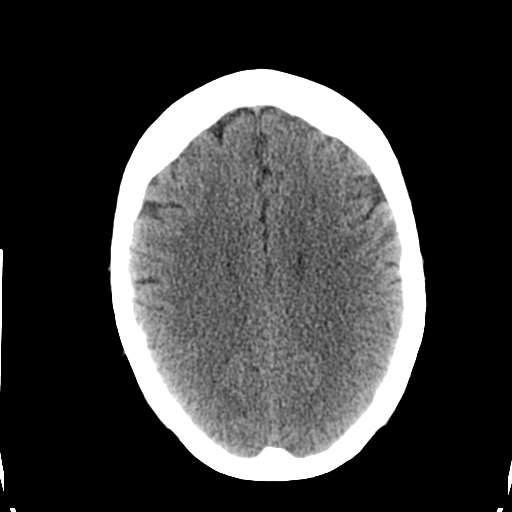
[im 16/30  bone]
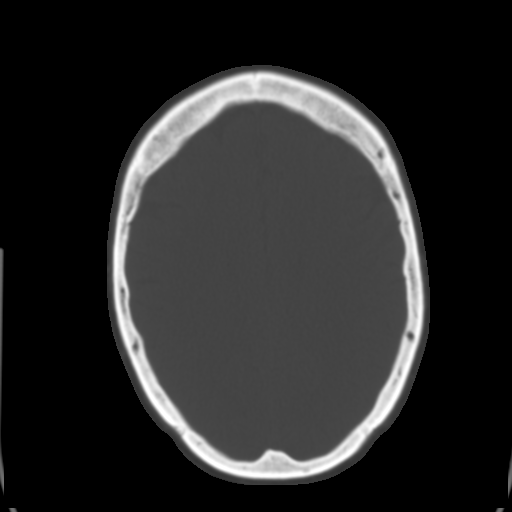
[im 18/30  brain]
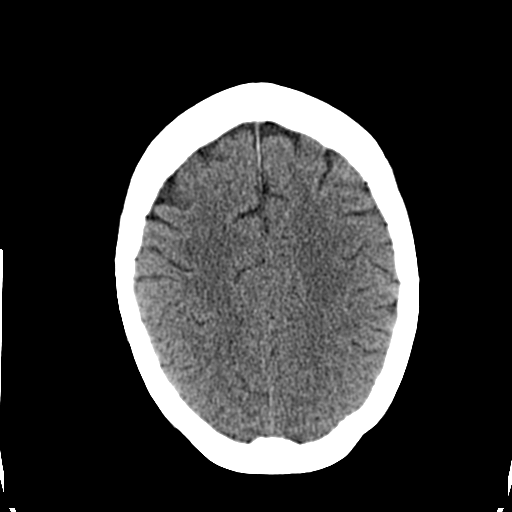
[im 20/30  brain]
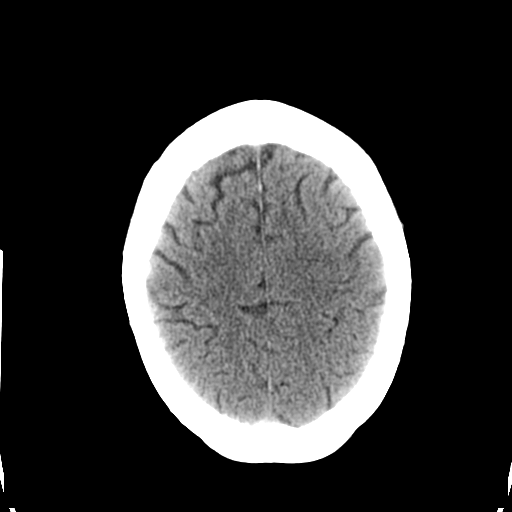
[im 22/30  brain]
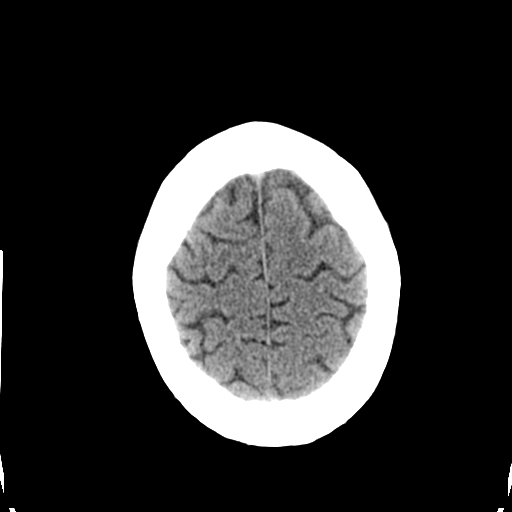
[im 23/30  brain]
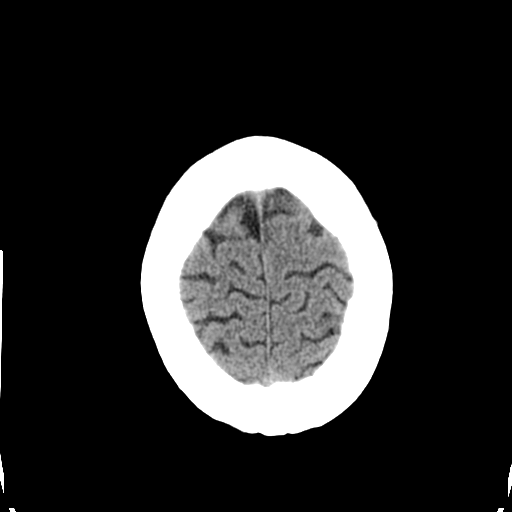
[im 23/30  bone]
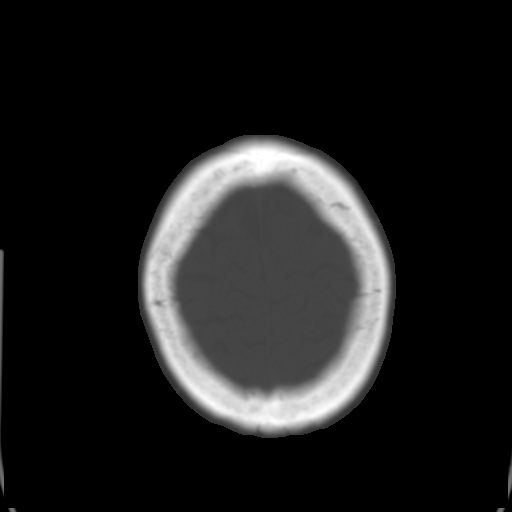
[im 25/30  brain]
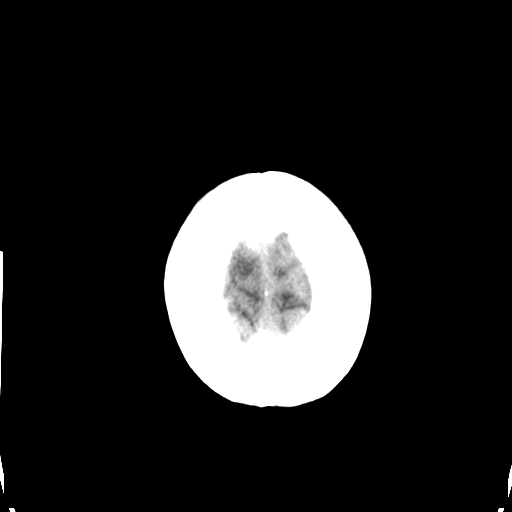
[im 27/30  brain]
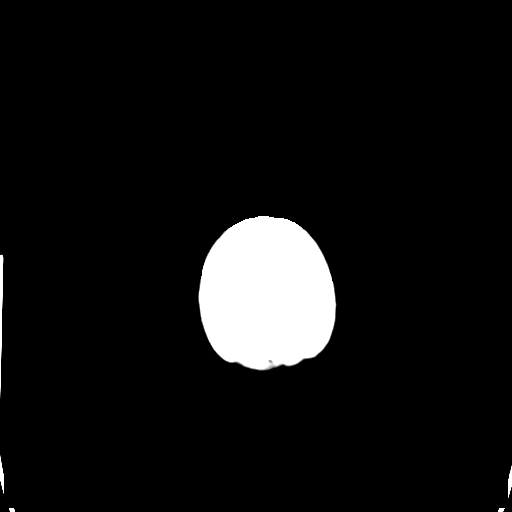
[im 29/30  brain]
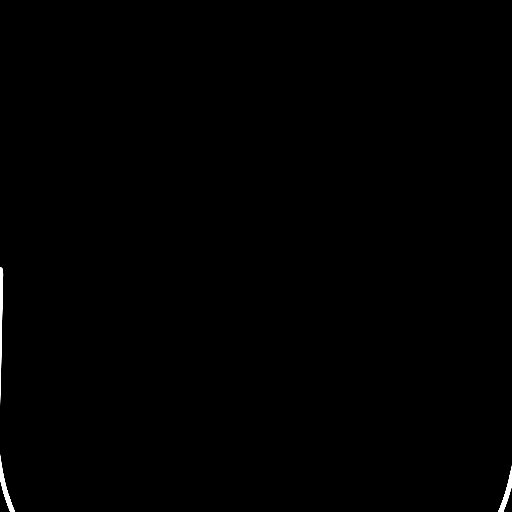

[16 of 30 positions shown; findings below may reference images not displayed]

FINDINGS: Ventricular size and CSF spaces normal.  Stable,
relatively mild, microvascular white matter disease.  No evidence
for acute infarct, hemorrhage, or mass lesion. No extra-axial fluid
collections or midline shift.  Calvarium intact.  No fluid in the
sinuses visualized.
IMPRESSION: Stable microvascular white matter disease - no acute findings.

## 2011-04-02 DIAGNOSIS — IMO0002 Reserved for concepts with insufficient information to code with codable children: Secondary | ICD-10-CM | POA: Diagnosis not present

## 2011-04-02 DIAGNOSIS — M5137 Other intervertebral disc degeneration, lumbosacral region: Secondary | ICD-10-CM | POA: Diagnosis not present

## 2011-04-05 ENCOUNTER — Other Ambulatory Visit: Payer: Self-pay | Admitting: Dermatology

## 2011-04-05 DIAGNOSIS — L570 Actinic keratosis: Secondary | ICD-10-CM | POA: Diagnosis not present

## 2011-04-05 DIAGNOSIS — D485 Neoplasm of uncertain behavior of skin: Secondary | ICD-10-CM | POA: Diagnosis not present

## 2011-04-21 DIAGNOSIS — N312 Flaccid neuropathic bladder, not elsewhere classified: Secondary | ICD-10-CM | POA: Diagnosis not present

## 2011-04-22 DIAGNOSIS — M719 Bursopathy, unspecified: Secondary | ICD-10-CM | POA: Diagnosis not present

## 2011-04-22 DIAGNOSIS — M5137 Other intervertebral disc degeneration, lumbosacral region: Secondary | ICD-10-CM | POA: Diagnosis not present

## 2011-04-22 DIAGNOSIS — M67919 Unspecified disorder of synovium and tendon, unspecified shoulder: Secondary | ICD-10-CM | POA: Diagnosis not present

## 2011-05-04 DIAGNOSIS — N390 Urinary tract infection, site not specified: Secondary | ICD-10-CM | POA: Diagnosis not present

## 2011-05-04 DIAGNOSIS — R310 Gross hematuria: Secondary | ICD-10-CM | POA: Diagnosis not present

## 2011-05-04 DIAGNOSIS — R3989 Other symptoms and signs involving the genitourinary system: Secondary | ICD-10-CM | POA: Diagnosis not present

## 2011-05-05 DIAGNOSIS — I2510 Atherosclerotic heart disease of native coronary artery without angina pectoris: Secondary | ICD-10-CM | POA: Diagnosis not present

## 2011-05-05 DIAGNOSIS — I10 Essential (primary) hypertension: Secondary | ICD-10-CM | POA: Diagnosis not present

## 2011-05-17 IMAGING — CR DG CHEST 2V
2 series · 2 of 2 positions shown · non-contrast
Comparison: Chest x-ray 09/26/2009.

CLINICAL DATA: Tingling in arms and hands.  Elevated blood
pressure.

CHEST - 2 VIEW

[w chest pa]
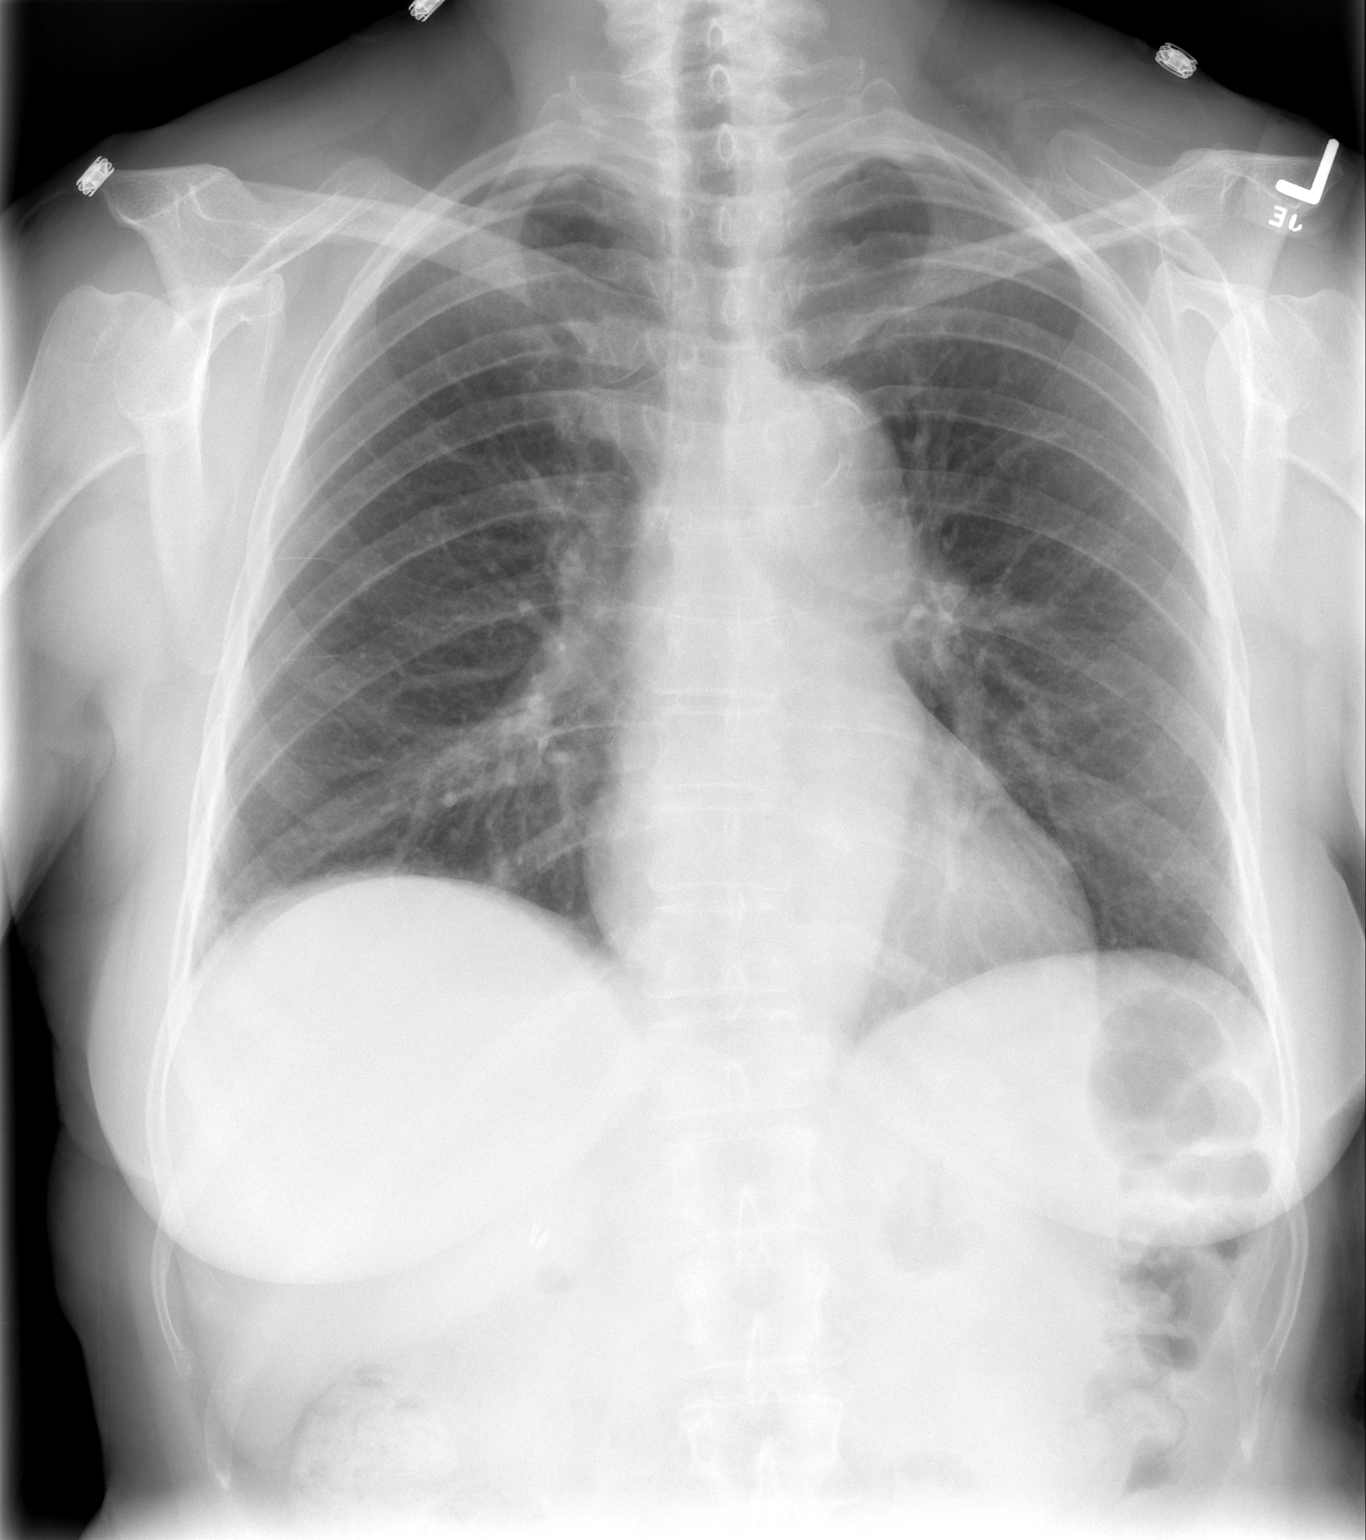

[w chest lat]
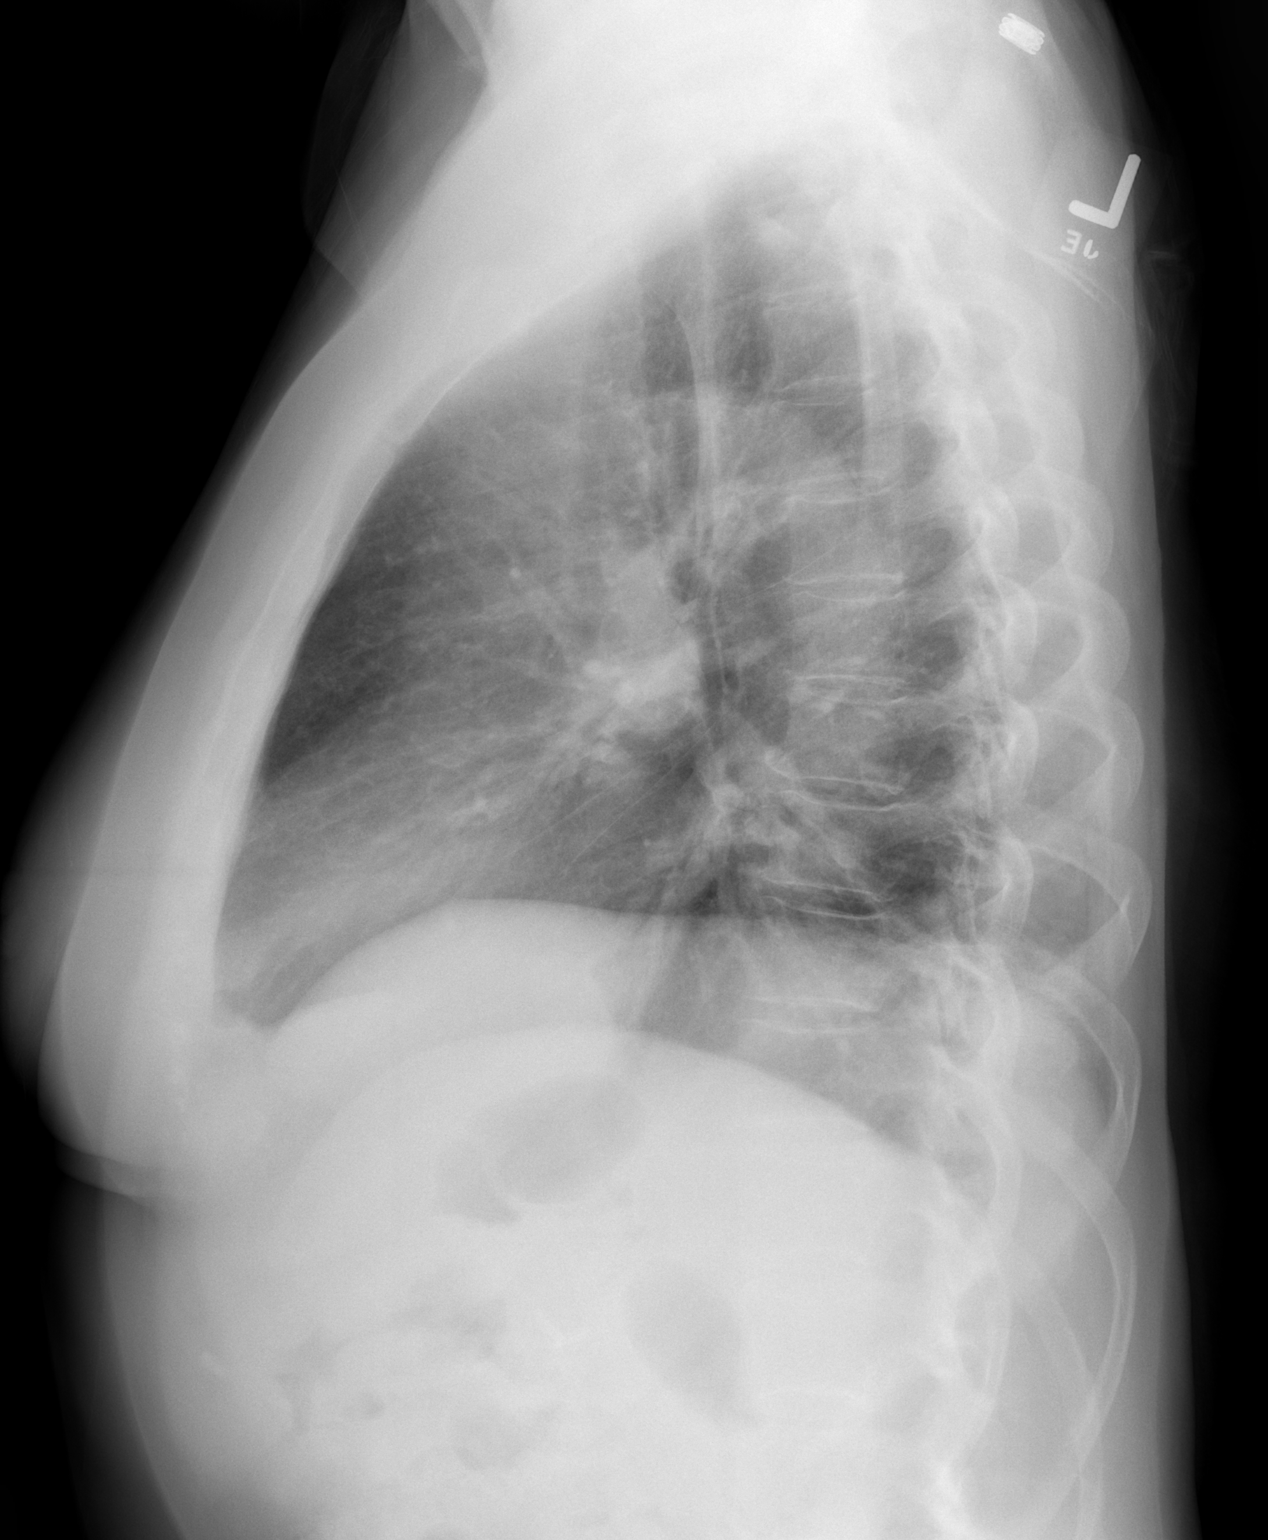

[2 of 2 positions shown; findings below may reference images not displayed]

FINDINGS: The cardiac silhouette, mediastinal and hilar contours
are within normal limits and stable.  There is tortuosity and
ectasia of the thoracic aorta.  This appears slightly more
prominent but I believe is due to slight rotation of the patient.
The prior study.  The pulmonary hila appear normal.  Lungs are
clear.  The bony thorax is intact.
IMPRESSION: No acute cardiopulmonary findings.

## 2011-05-17 IMAGING — CT CT HEAD W/O CM
1 of 2 series · 13 of 30 positions shown, 17 images · non-contrast
Comparison: MRI and CT of the head 09/26/2009.

CLINICAL DATA: Upper extremity tingling.  Posterior headache.
History of melanoma of the skin.  History of hypertension and
diabetes mellitus.

CT HEAD WITHOUT CONTRAST
TECHNIQUE: Contiguous axial images were obtained from the base of
the skull through the vertex without contrast.

[Series 2: brain · axial · 0.49mm/px · z∈[+110,+230]mm · 13 of 28 slices shown, 17 images]
[im 2/28  brain]
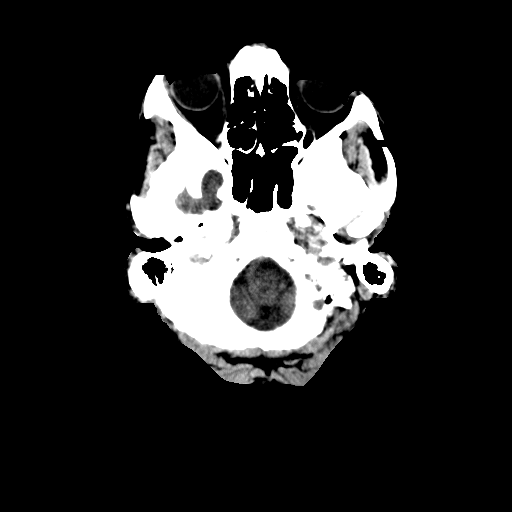
[im 2/28  bone]
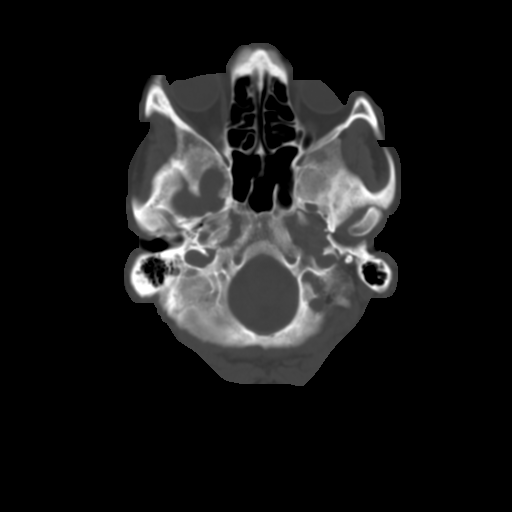
[im 4/28  brain]
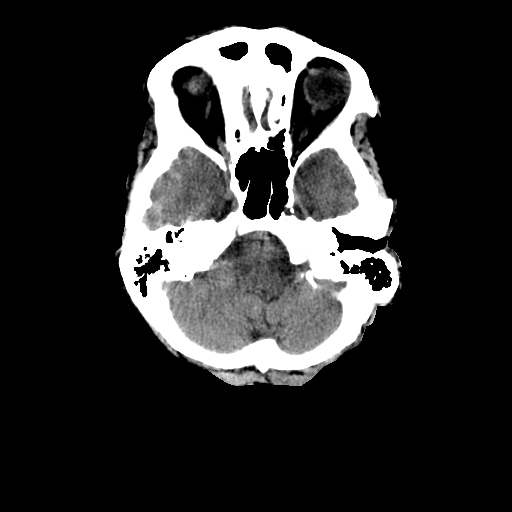
[im 6/28  brain]
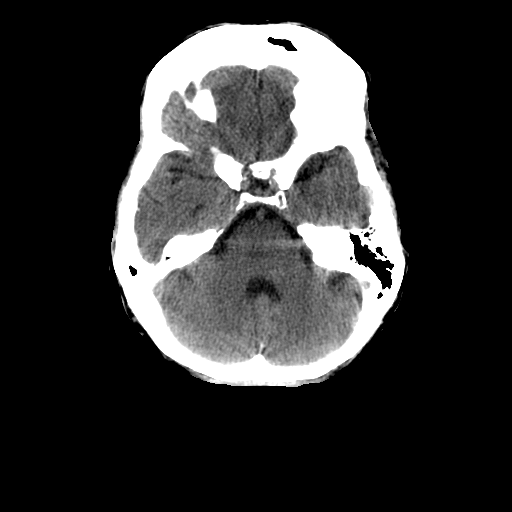
[im 8/28  brain]
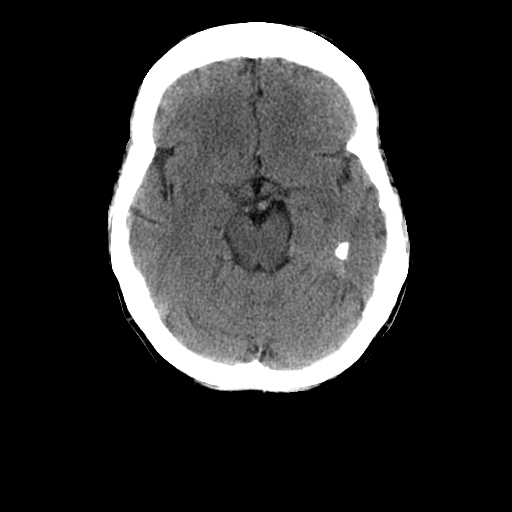
[im 10/28  brain]
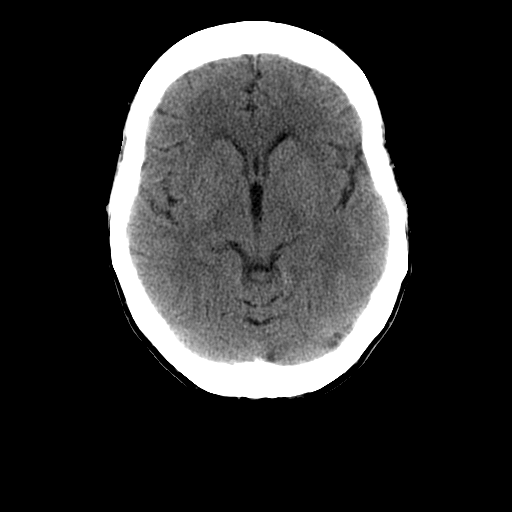
[im 10/28  bone]
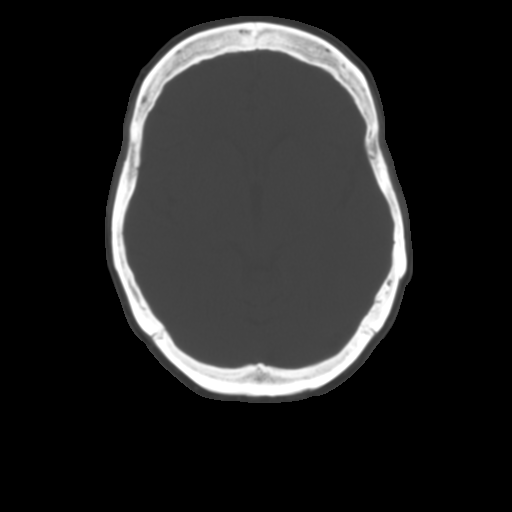
[im 12/28  brain]
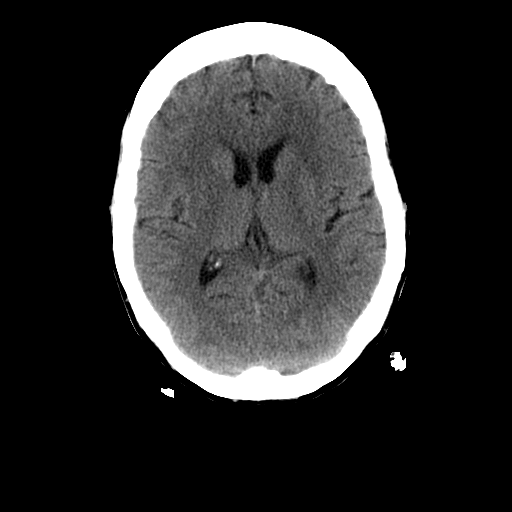
[im 14/28  brain]
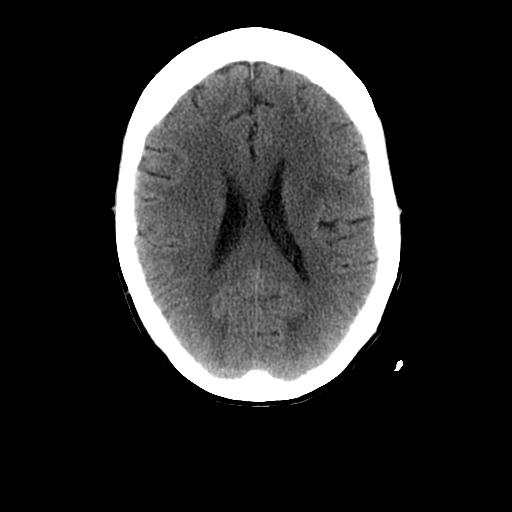
[im 16/28  brain]
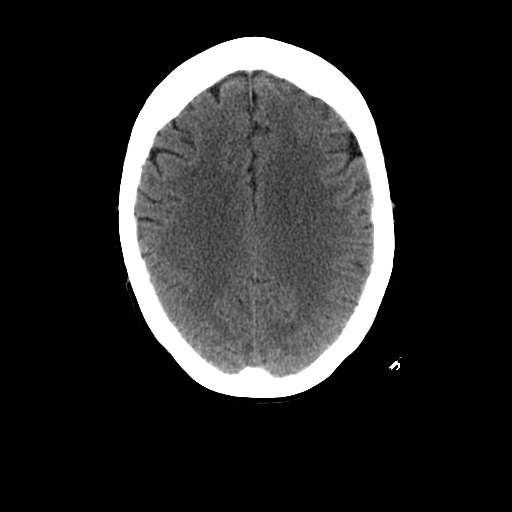
[im 18/28  brain]
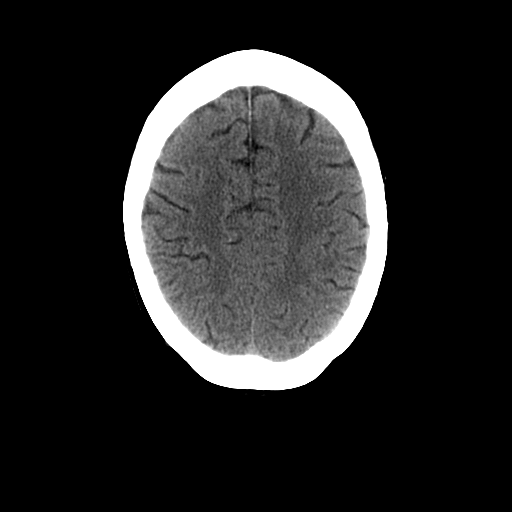
[im 18/28  bone]
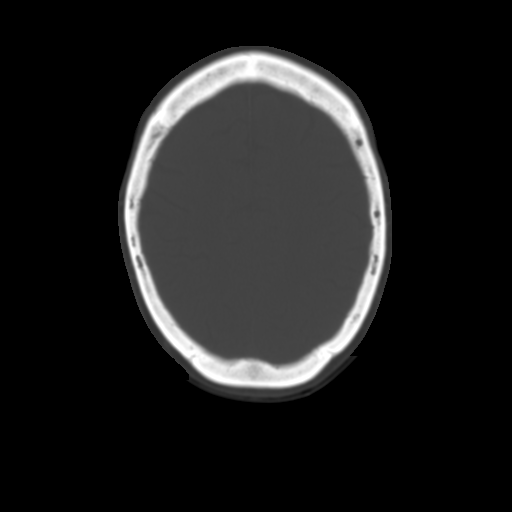
[im 20/28  brain]
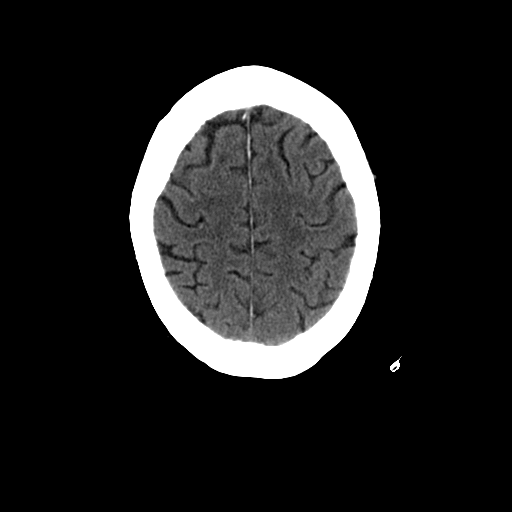
[im 22/28  brain]
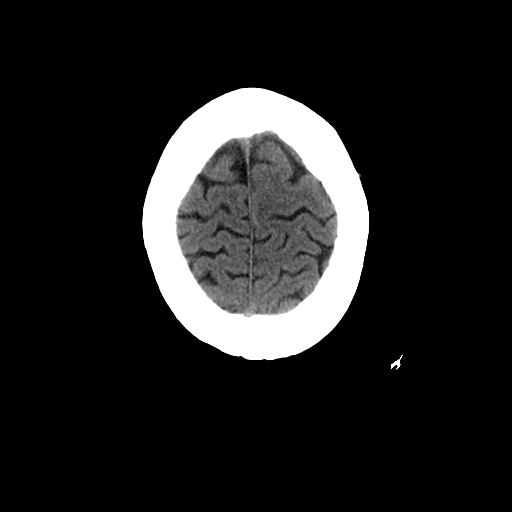
[im 24/28  brain]
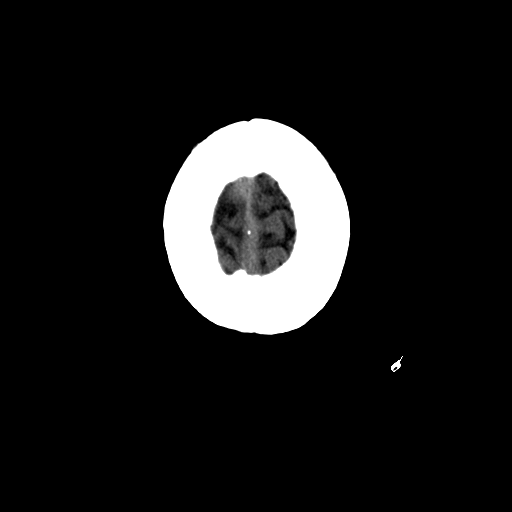
[im 26/28  brain]
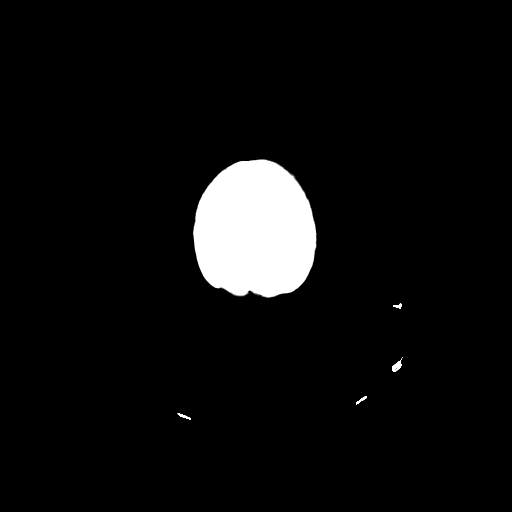
[im 26/28  bone]
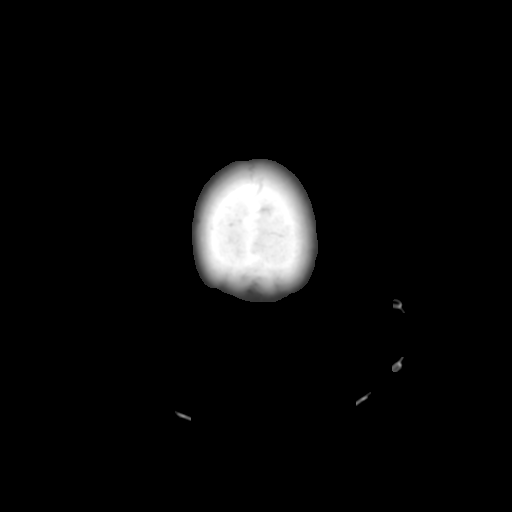

[13 of 30 positions shown; findings below may reference images not displayed]

FINDINGS: There is no evidence for acute infarction, intracranial
hemorrhage, mass lesion, hydrocephalus, or extra-axial fluid. There
is mild age appropriate atrophy.  Minimal chronic microvascular
ischemic change is observed.  There are no CT signs of proximal
vascular thrombosis.  The calvarium is intact.  There is no acute
sinus or mastoid disease.  When compared with prior recent CT and
MR, there is no significant change.
IMPRESSION: Mild atrophy and small vessel disease.  No acute intracranial
findings.  No interval change from most recent priors.

## 2011-05-19 DIAGNOSIS — R5381 Other malaise: Secondary | ICD-10-CM | POA: Diagnosis not present

## 2011-05-19 DIAGNOSIS — Z79899 Other long term (current) drug therapy: Secondary | ICD-10-CM | POA: Diagnosis not present

## 2011-05-19 DIAGNOSIS — N182 Chronic kidney disease, stage 2 (mild): Secondary | ICD-10-CM | POA: Diagnosis not present

## 2011-05-19 DIAGNOSIS — D649 Anemia, unspecified: Secondary | ICD-10-CM | POA: Diagnosis not present

## 2011-05-19 DIAGNOSIS — E782 Mixed hyperlipidemia: Secondary | ICD-10-CM | POA: Diagnosis not present

## 2011-06-07 DIAGNOSIS — E785 Hyperlipidemia, unspecified: Secondary | ICD-10-CM | POA: Diagnosis not present

## 2011-06-07 DIAGNOSIS — R5381 Other malaise: Secondary | ICD-10-CM | POA: Diagnosis not present

## 2011-06-07 DIAGNOSIS — I129 Hypertensive chronic kidney disease with stage 1 through stage 4 chronic kidney disease, or unspecified chronic kidney disease: Secondary | ICD-10-CM | POA: Diagnosis not present

## 2011-06-07 DIAGNOSIS — N182 Chronic kidney disease, stage 2 (mild): Secondary | ICD-10-CM | POA: Diagnosis not present

## 2011-06-16 DIAGNOSIS — R413 Other amnesia: Secondary | ICD-10-CM | POA: Diagnosis not present

## 2011-06-16 DIAGNOSIS — G4730 Sleep apnea, unspecified: Secondary | ICD-10-CM | POA: Diagnosis not present

## 2011-06-16 DIAGNOSIS — G40209 Localization-related (focal) (partial) symptomatic epilepsy and epileptic syndromes with complex partial seizures, not intractable, without status epilepticus: Secondary | ICD-10-CM | POA: Diagnosis not present

## 2011-06-16 DIAGNOSIS — G3184 Mild cognitive impairment, so stated: Secondary | ICD-10-CM | POA: Diagnosis not present

## 2011-06-25 IMAGING — CR DG CHEST 1V PORT
1 series · 1 of 1 positions shown · non-contrast
Comparison: 11/21/2009 and earlier.

CLINICAL DATA: 69-year-old female with chest pain.

PORTABLE CHEST - 1 VIEW

[view not recorded]
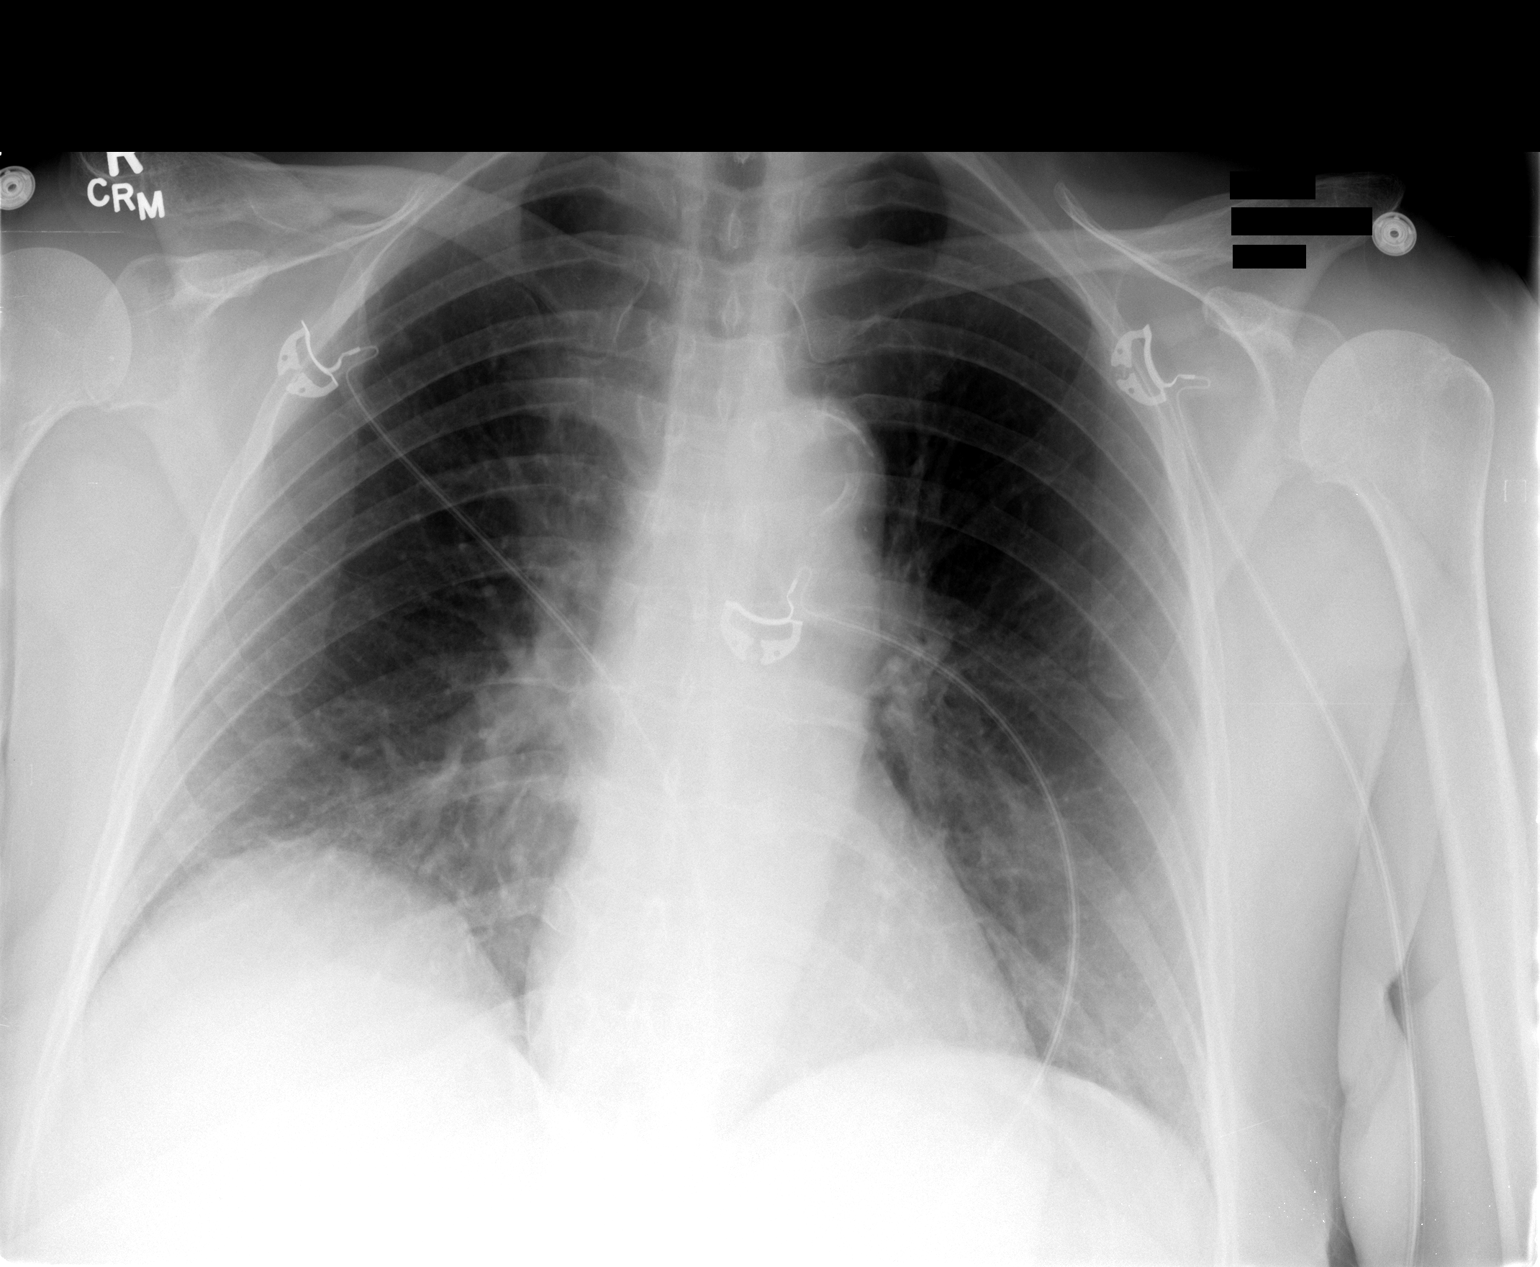

[1 of 1 positions shown; findings below may reference images not displayed]

FINDINGS: Portable upright AP view 0880 hours.  Slightly shallow
lung volumes with mild elevation of the right hemidiaphragm.
Cardiac size and mediastinal contours are within normal limits.
Visualized tracheal air column is within normal limits.  No
pneumothorax, pulmonary edema, pleural effusion or confluent
pulmonary opacity.
IMPRESSION: Low lung volumes.

## 2011-07-06 ENCOUNTER — Ambulatory Visit (INDEPENDENT_AMBULATORY_CARE_PROVIDER_SITE_OTHER): Payer: Medicare Other | Admitting: Family Medicine

## 2011-07-06 VITALS — BP 174/89 | HR 72 | Temp 97.8°F | Resp 18 | Ht 65.5 in | Wt 163.0 lb

## 2011-07-06 DIAGNOSIS — T148 Other injury of unspecified body region: Secondary | ICD-10-CM | POA: Diagnosis not present

## 2011-07-06 DIAGNOSIS — M79609 Pain in unspecified limb: Secondary | ICD-10-CM

## 2011-07-06 DIAGNOSIS — W57XXXA Bitten or stung by nonvenomous insect and other nonvenomous arthropods, initial encounter: Secondary | ICD-10-CM

## 2011-07-06 DIAGNOSIS — M79604 Pain in right leg: Secondary | ICD-10-CM

## 2011-07-06 LAB — POCT CBC
Granulocyte percent: 60.3 %G (ref 37–80)
HCT, POC: 36.2 % — AB (ref 37.7–47.9)
Hemoglobin: 11.5 g/dL — AB (ref 12.2–16.2)
Lymph, poc: 2.9 (ref 0.6–3.4)
MCH, POC: 27.6 pg (ref 27–31.2)
MCHC: 31.8 g/dL (ref 31.8–35.4)
MCV: 87 fL (ref 80–97)
MID (cbc): 0.6 (ref 0–0.9)
MPV: 10 fL (ref 0–99.8)
POC Granulocyte: 5.2 (ref 2–6.9)
POC LYMPH PERCENT: 33.3 %L (ref 10–50)
POC MID %: 6.4 %M (ref 0–12)
Platelet Count, POC: 259 10*3/uL (ref 142–424)
RBC: 4.16 M/uL (ref 4.04–5.48)
RDW, POC: 13.6 %
WBC: 8.6 10*3/uL (ref 4.6–10.2)

## 2011-07-06 MED ORDER — DOXYCYCLINE HYCLATE 100 MG PO TABS: 100.0000 mg | ORAL_TABLET | Freq: Two times a day (BID) | ORAL | Status: AC

## 2011-07-06 NOTE — Progress Notes (Signed)
  Subjective:    Patient ID: [name], female    DOB: 1940/12/29, 71 y.o.   MRN: [id number]  HPI Patient complains of 1 week history of leg discomfort  More swelling redness and pain Has been on her feet a great deal more the last 2 days  ASPVD CAD DM  No history of ulceration A1C 5.0  Tick bite sunday Review of Systems     Objective:   Physical Exam  Constitutional: She appears well-developed.  Neck: Neck supple.  Cardiovascular: Normal rate, regular rhythm and normal heart sounds.   Pulmonary/Chest: Effort normal and breath sounds normal.  Neurological: She is alert.  Skin:       Minimal erythema with induration (R) ankle/foot No ulceration Unable to palpate distal pulses though brisk refill     Results for orders placed in visit on 07/06/11  POCT CBC      Component Value Range   WBC 8.6  4.6 - 10.2 (K/uL)   Lymph, poc 2.9  0.6 - 3.4    POC LYMPH PERCENT 33.3  10 - 50 (%L)   MID (cbc) 0.6  0 - 0.9    POC MID % 6.4  0 - 12 (%M)   POC Granulocyte 5.2  2 - 6.9    Granulocyte percent 60.3  37 - 80 (%G)   RBC 4.16  4.04 - 5.48 (M/uL)   Hemoglobin 11.5 (*) 12.2 - 16.2 (g/dL)   HCT, POC 36.2 (*) 37.7 - 47.9 (%)   MCV 87.0  80 - 97 (fL)   MCH, POC 27.6  27 - 31.2 (pg)   MCHC 31.8  31.8 - 35.4 (g/dL)   RDW, POC 13.6     Platelet Count, POC 259  142 - 424 (K/uL)   MPV 10.0  0 - 99.8 (fL)      Assessment & Plan:  1) LE edema/ venous stasis 2) Tick bite 3) DM  Compression hose Elevate extremities See medications on AVS

## 2011-07-27 ENCOUNTER — Encounter (INDEPENDENT_AMBULATORY_CARE_PROVIDER_SITE_OTHER): Payer: Medicare Other | Admitting: Ophthalmology

## 2011-07-27 DIAGNOSIS — E11319 Type 2 diabetes mellitus with unspecified diabetic retinopathy without macular edema: Secondary | ICD-10-CM | POA: Diagnosis not present

## 2011-07-27 DIAGNOSIS — E1165 Type 2 diabetes mellitus with hyperglycemia: Secondary | ICD-10-CM | POA: Diagnosis not present

## 2011-07-27 DIAGNOSIS — E1139 Type 2 diabetes mellitus with other diabetic ophthalmic complication: Secondary | ICD-10-CM

## 2011-07-27 DIAGNOSIS — I10 Essential (primary) hypertension: Secondary | ICD-10-CM

## 2011-07-27 DIAGNOSIS — H43819 Vitreous degeneration, unspecified eye: Secondary | ICD-10-CM

## 2011-07-27 DIAGNOSIS — H35039 Hypertensive retinopathy, unspecified eye: Secondary | ICD-10-CM | POA: Diagnosis not present

## 2011-08-03 DIAGNOSIS — G609 Hereditary and idiopathic neuropathy, unspecified: Secondary | ICD-10-CM | POA: Diagnosis not present

## 2011-08-03 DIAGNOSIS — I10 Essential (primary) hypertension: Secondary | ICD-10-CM | POA: Diagnosis not present

## 2011-08-03 DIAGNOSIS — E7800 Pure hypercholesterolemia, unspecified: Secondary | ICD-10-CM | POA: Diagnosis not present

## 2011-08-03 DIAGNOSIS — IMO0001 Reserved for inherently not codable concepts without codable children: Secondary | ICD-10-CM | POA: Diagnosis not present

## 2011-08-31 ENCOUNTER — Other Ambulatory Visit: Payer: Self-pay | Admitting: Internal Medicine

## 2011-08-31 DIAGNOSIS — Z1231 Encounter for screening mammogram for malignant neoplasm of breast: Secondary | ICD-10-CM

## 2011-10-05 ENCOUNTER — Ambulatory Visit
Admission: RE | Admit: 2011-10-05 | Discharge: 2011-10-05 | Disposition: A | Discharge status: Home / Self Care | Payer: Medicare Other | Source: Ambulatory Visit | Attending: Internal Medicine | Admitting: Internal Medicine

## 2011-10-05 DIAGNOSIS — Z1231 Encounter for screening mammogram for malignant neoplasm of breast: Secondary | ICD-10-CM | POA: Diagnosis not present

## 2011-10-11 ENCOUNTER — Other Ambulatory Visit (HOSPITAL_COMMUNITY): Payer: Self-pay | Admitting: Internal Medicine

## 2011-10-11 ENCOUNTER — Other Ambulatory Visit: Payer: Self-pay | Admitting: Internal Medicine

## 2011-10-11 DIAGNOSIS — Z803 Family history of malignant neoplasm of breast: Secondary | ICD-10-CM

## 2011-10-13 ENCOUNTER — Other Ambulatory Visit (HOSPITAL_COMMUNITY): Payer: Medicare Other

## 2011-10-19 ENCOUNTER — Ambulatory Visit
Admission: RE | Admit: 2011-10-19 | Discharge: 2011-10-19 | Disposition: A | Discharge status: Home / Self Care | Payer: Medicare Other | Source: Ambulatory Visit | Attending: Internal Medicine | Admitting: Internal Medicine

## 2011-10-19 DIAGNOSIS — R310 Gross hematuria: Secondary | ICD-10-CM | POA: Diagnosis not present

## 2011-10-19 DIAGNOSIS — R3989 Other symptoms and signs involving the genitourinary system: Secondary | ICD-10-CM | POA: Diagnosis not present

## 2011-10-19 DIAGNOSIS — N312 Flaccid neuropathic bladder, not elsewhere classified: Secondary | ICD-10-CM | POA: Diagnosis not present

## 2011-10-19 DIAGNOSIS — Z803 Family history of malignant neoplasm of breast: Secondary | ICD-10-CM

## 2011-10-19 MED ORDER — GADOBENATE DIMEGLUMINE 529 MG/ML IV SOLN
15.0000 mL | Freq: Once | INTRAVENOUS | Status: AC | PRN
  Administered 2011-10-19: 15 mL via INTRAVENOUS

## 2011-10-28 HISTORY — PX: NM MYOVIEW LTD: HXRAD82

## 2011-11-01 DIAGNOSIS — R310 Gross hematuria: Secondary | ICD-10-CM | POA: Diagnosis not present

## 2011-11-09 DIAGNOSIS — I2510 Atherosclerotic heart disease of native coronary artery without angina pectoris: Secondary | ICD-10-CM | POA: Diagnosis not present

## 2011-11-09 DIAGNOSIS — E119 Type 2 diabetes mellitus without complications: Secondary | ICD-10-CM | POA: Diagnosis not present

## 2011-11-09 DIAGNOSIS — I10 Essential (primary) hypertension: Secondary | ICD-10-CM | POA: Diagnosis not present

## 2011-11-16 DIAGNOSIS — I2510 Atherosclerotic heart disease of native coronary artery without angina pectoris: Secondary | ICD-10-CM | POA: Diagnosis not present

## 2011-11-16 DIAGNOSIS — E119 Type 2 diabetes mellitus without complications: Secondary | ICD-10-CM | POA: Diagnosis not present

## 2011-11-16 DIAGNOSIS — R079 Chest pain, unspecified: Secondary | ICD-10-CM | POA: Diagnosis not present

## 2011-11-16 DIAGNOSIS — I10 Essential (primary) hypertension: Secondary | ICD-10-CM | POA: Diagnosis not present

## 2011-11-16 HISTORY — PX: NM MYOVIEW LTD: HXRAD82

## 2011-11-17 DIAGNOSIS — N8110 Cystocele, unspecified: Secondary | ICD-10-CM | POA: Diagnosis not present

## 2011-11-17 DIAGNOSIS — R310 Gross hematuria: Secondary | ICD-10-CM | POA: Diagnosis not present

## 2011-11-17 DIAGNOSIS — N8111 Cystocele, midline: Secondary | ICD-10-CM | POA: Diagnosis not present

## 2011-11-17 DIAGNOSIS — N312 Flaccid neuropathic bladder, not elsewhere classified: Secondary | ICD-10-CM | POA: Diagnosis not present

## 2011-12-08 DIAGNOSIS — R3989 Other symptoms and signs involving the genitourinary system: Secondary | ICD-10-CM | POA: Diagnosis not present

## 2011-12-16 DIAGNOSIS — G3184 Mild cognitive impairment, so stated: Secondary | ICD-10-CM | POA: Diagnosis not present

## 2011-12-16 DIAGNOSIS — R413 Other amnesia: Secondary | ICD-10-CM | POA: Diagnosis not present

## 2011-12-16 DIAGNOSIS — G40209 Localization-related (focal) (partial) symptomatic epilepsy and epileptic syndromes with complex partial seizures, not intractable, without status epilepticus: Secondary | ICD-10-CM | POA: Diagnosis not present

## 2011-12-27 ENCOUNTER — Other Ambulatory Visit: Payer: Self-pay | Admitting: Dermatology

## 2011-12-27 DIAGNOSIS — Z85820 Personal history of malignant melanoma of skin: Secondary | ICD-10-CM | POA: Diagnosis not present

## 2011-12-27 DIAGNOSIS — D485 Neoplasm of uncertain behavior of skin: Secondary | ICD-10-CM | POA: Diagnosis not present

## 2011-12-27 DIAGNOSIS — L609 Nail disorder, unspecified: Secondary | ICD-10-CM | POA: Diagnosis not present

## 2011-12-27 DIAGNOSIS — L719 Rosacea, unspecified: Secondary | ICD-10-CM | POA: Diagnosis not present

## 2011-12-27 DIAGNOSIS — L570 Actinic keratosis: Secondary | ICD-10-CM | POA: Diagnosis not present

## 2011-12-27 DIAGNOSIS — L821 Other seborrheic keratosis: Secondary | ICD-10-CM | POA: Diagnosis not present

## 2011-12-27 DIAGNOSIS — Z85828 Personal history of other malignant neoplasm of skin: Secondary | ICD-10-CM | POA: Diagnosis not present

## 2012-02-03 DIAGNOSIS — I10 Essential (primary) hypertension: Secondary | ICD-10-CM | POA: Diagnosis not present

## 2012-02-03 DIAGNOSIS — IMO0001 Reserved for inherently not codable concepts without codable children: Secondary | ICD-10-CM | POA: Diagnosis not present

## 2012-02-03 DIAGNOSIS — E7800 Pure hypercholesterolemia, unspecified: Secondary | ICD-10-CM | POA: Diagnosis not present

## 2012-02-03 DIAGNOSIS — E039 Hypothyroidism, unspecified: Secondary | ICD-10-CM | POA: Diagnosis not present

## 2012-02-03 DIAGNOSIS — G609 Hereditary and idiopathic neuropathy, unspecified: Secondary | ICD-10-CM | POA: Diagnosis not present

## 2012-03-01 DIAGNOSIS — L570 Actinic keratosis: Secondary | ICD-10-CM | POA: Diagnosis not present

## 2012-03-02 DIAGNOSIS — Z23 Encounter for immunization: Secondary | ICD-10-CM | POA: Diagnosis not present

## 2012-04-06 DIAGNOSIS — I2510 Atherosclerotic heart disease of native coronary artery without angina pectoris: Secondary | ICD-10-CM | POA: Diagnosis not present

## 2012-04-06 DIAGNOSIS — I10 Essential (primary) hypertension: Secondary | ICD-10-CM | POA: Diagnosis not present

## 2012-04-06 DIAGNOSIS — J019 Acute sinusitis, unspecified: Secondary | ICD-10-CM | POA: Diagnosis not present

## 2012-05-01 DIAGNOSIS — R002 Palpitations: Secondary | ICD-10-CM | POA: Diagnosis not present

## 2012-05-01 DIAGNOSIS — Z9861 Coronary angioplasty status: Secondary | ICD-10-CM | POA: Diagnosis not present

## 2012-05-01 DIAGNOSIS — R079 Chest pain, unspecified: Secondary | ICD-10-CM | POA: Diagnosis not present

## 2012-05-01 DIAGNOSIS — I2510 Atherosclerotic heart disease of native coronary artery without angina pectoris: Secondary | ICD-10-CM | POA: Diagnosis not present

## 2012-05-23 DIAGNOSIS — N312 Flaccid neuropathic bladder, not elsewhere classified: Secondary | ICD-10-CM | POA: Diagnosis not present

## 2012-06-07 DIAGNOSIS — R413 Other amnesia: Secondary | ICD-10-CM | POA: Diagnosis not present

## 2012-06-07 DIAGNOSIS — F0280 Dementia in other diseases classified elsewhere without behavioral disturbance: Secondary | ICD-10-CM | POA: Diagnosis not present

## 2012-06-15 DIAGNOSIS — N182 Chronic kidney disease, stage 2 (mild): Secondary | ICD-10-CM | POA: Diagnosis not present

## 2012-06-21 DIAGNOSIS — I129 Hypertensive chronic kidney disease with stage 1 through stage 4 chronic kidney disease, or unspecified chronic kidney disease: Secondary | ICD-10-CM | POA: Diagnosis not present

## 2012-06-21 DIAGNOSIS — D649 Anemia, unspecified: Secondary | ICD-10-CM | POA: Diagnosis not present

## 2012-06-21 DIAGNOSIS — N182 Chronic kidney disease, stage 2 (mild): Secondary | ICD-10-CM | POA: Diagnosis not present

## 2012-06-21 DIAGNOSIS — N2581 Secondary hyperparathyroidism of renal origin: Secondary | ICD-10-CM | POA: Diagnosis not present

## 2012-06-29 DIAGNOSIS — N182 Chronic kidney disease, stage 2 (mild): Secondary | ICD-10-CM | POA: Diagnosis not present

## 2012-07-09 ENCOUNTER — Emergency Department (HOSPITAL_COMMUNITY): Payer: Medicare Other

## 2012-07-09 ENCOUNTER — Encounter (HOSPITAL_COMMUNITY): Payer: Self-pay | Admitting: Emergency Medicine

## 2012-07-09 ENCOUNTER — Emergency Department (HOSPITAL_COMMUNITY)
Admission: EM | Admit: 2012-07-09 | Discharge: 2012-07-09 | Disposition: A | Discharge status: Home / Self Care | Payer: Medicare Other | Attending: Emergency Medicine | Admitting: Emergency Medicine

## 2012-07-09 DIAGNOSIS — Z8739 Personal history of other diseases of the musculoskeletal system and connective tissue: Secondary | ICD-10-CM | POA: Diagnosis not present

## 2012-07-09 DIAGNOSIS — Z8669 Personal history of other diseases of the nervous system and sense organs: Secondary | ICD-10-CM | POA: Insufficient documentation

## 2012-07-09 DIAGNOSIS — T148XXA Other injury of unspecified body region, initial encounter: Secondary | ICD-10-CM

## 2012-07-09 DIAGNOSIS — Z859 Personal history of malignant neoplasm, unspecified: Secondary | ICD-10-CM | POA: Insufficient documentation

## 2012-07-09 DIAGNOSIS — Y939 Activity, unspecified: Secondary | ICD-10-CM | POA: Insufficient documentation

## 2012-07-09 DIAGNOSIS — L03119 Cellulitis of unspecified part of limb: Secondary | ICD-10-CM | POA: Diagnosis not present

## 2012-07-09 DIAGNOSIS — Y929 Unspecified place or not applicable: Secondary | ICD-10-CM | POA: Insufficient documentation

## 2012-07-09 DIAGNOSIS — M79609 Pain in unspecified limb: Secondary | ICD-10-CM | POA: Diagnosis not present

## 2012-07-09 DIAGNOSIS — X58XXXA Exposure to other specified factors, initial encounter: Secondary | ICD-10-CM | POA: Insufficient documentation

## 2012-07-09 DIAGNOSIS — E119 Type 2 diabetes mellitus without complications: Secondary | ICD-10-CM | POA: Diagnosis not present

## 2012-07-09 DIAGNOSIS — L0390 Cellulitis, unspecified: Secondary | ICD-10-CM

## 2012-07-09 DIAGNOSIS — I10 Essential (primary) hypertension: Secondary | ICD-10-CM | POA: Insufficient documentation

## 2012-07-09 DIAGNOSIS — S99929A Unspecified injury of unspecified foot, initial encounter: Secondary | ICD-10-CM | POA: Diagnosis not present

## 2012-07-09 DIAGNOSIS — L02619 Cutaneous abscess of unspecified foot: Secondary | ICD-10-CM | POA: Insufficient documentation

## 2012-07-09 DIAGNOSIS — S9030XA Contusion of unspecified foot, initial encounter: Secondary | ICD-10-CM | POA: Insufficient documentation

## 2012-07-09 DIAGNOSIS — S8990XA Unspecified injury of unspecified lower leg, initial encounter: Secondary | ICD-10-CM | POA: Diagnosis not present

## 2012-07-09 DIAGNOSIS — Z79899 Other long term (current) drug therapy: Secondary | ICD-10-CM | POA: Insufficient documentation

## 2012-07-09 HISTORY — DX: Polyneuropathy, unspecified: G62.9

## 2012-07-09 HISTORY — DX: Unspecified osteoarthritis, unspecified site: M19.90

## 2012-07-09 HISTORY — DX: Malignant (primary) neoplasm, unspecified: C80.1

## 2012-07-09 HISTORY — DX: Type 2 diabetes mellitus without complications: E11.9

## 2012-07-09 MED ORDER — SULFAMETHOXAZOLE-TRIMETHOPRIM 800-160 MG PO TABS: 1.0000 | ORAL_TABLET | Freq: Two times a day (BID) | ORAL | Status: DC

## 2012-07-09 MED ORDER — AMOXICILLIN-POT CLAVULANATE 875-125 MG PO TABS: 1.0000 | ORAL_TABLET | Freq: Two times a day (BID) | ORAL | Status: DC

## 2012-07-09 NOTE — ED Notes (Signed)
Pt provided with water and peanut butter/crackers.

## 2012-07-09 NOTE — ED Notes (Signed)
Left foot, left big toe has ecchymosis and redness.  Deformed toenail.

## 2012-07-09 NOTE — ED Provider Notes (Signed)
History     CSN: [id number]  Arrival date & time 07/09/12  [id number]   First MD Initiated Contact with Patient 07/09/12 [phone]      Chief Complaint  Patient presents with  . Foot Pain    left foot    (Consider location/radiation/quality/duration/timing/severity/associated sxs/prior treatment) HPI Comments: Patient comes to the ER for evaluation of pain and swelling of her left foot. Patient reports that she dropped a can of green beans on her foot about a week ago. Patient reports that she takes aspirin and Plavix for heart condition. She reports that there was a lot of bruising and swelling initially. She is still having some trouble ambulating because of the pain. She had some bleeding from around the nail, but that has stopped. Patient's daughter has now become concerned because the foot is becoming slightly red and warm to the touch in addition to the swelling. She has not had any fever.  Patient is a 72 y.o. female presenting with lower extremity pain.  Foot Pain    Past Medical History  Diagnosis Date  . Diabetes mellitus without complication   . Peripheral neuropathy   . Arthritis   . Cancer   . Hypertension     Past Surgical History  Procedure Laterality Date  . Abdominal hysterectomy    . Bladder suspension    . Appendectomy    . Heart stents    . Cholecystectomy      History reviewed. No pertinent family history.  History  Substance Use Topics  . Smoking status: Never Smoker   . Smokeless tobacco: Not on file  . Alcohol Use: No    OB History   Grav Para Term Preterm Abortions TAB SAB Ect Mult Living                  Review of Systems  Constitutional: Negative for fever.  Skin: Positive for color change and wound.    Allergies  Review of patient's allergies indicates no known allergies.  Home Medications   Current Outpatient Rx  Name  Route  Sig  Dispense  Refill  . bumetanide (BUMEX) 1 MG tablet   Oral   Take 1 mg by mouth daily.         .  clopidogrel (PLAVIX) 75 MG tablet   Oral   Take 75 mg by mouth daily.         Marland Kitchen estrogens, conjugated, (PREMARIN) 0.3 MG tablet   Oral   Take 0.3 mg by mouth daily. Take daily for 21 days then do not take for 7 days.         Marland Kitchen l-methylfolate-b2-b6-b12 (CEREFOLIN) 08-27-48-5 MG TABS   Oral   Take 1 tablet by mouth daily.         . metFORMIN (GLUCOPHAGE) 500 MG tablet   Oral   Take 500 mg by mouth 2 (two) times daily with a meal.         . metroNIDAZOLE (METROCREAM) 0.75 % cream   Topical   Apply topically 2 (two) times daily.         . niacin (NIASPAN) 500 MG CR tablet   Oral   Take 500 mg by mouth at bedtime.         . nitroGLYCERIN (NITROSTAT) 0.4 MG SL tablet   Sublingual   Place 0.4 mg under the tongue every 5 (five) minutes as needed.         . rosuvastatin (CRESTOR) 10 MG tablet  Oral   Take 10 mg by mouth daily.         . Vitamin D, Cholecalciferol, 400 UNITS CHEW   Oral   Chew by mouth.           BP 150/80  Pulse 81  Temp(Src) 98.2 F (36.8 C) (Oral)  Resp 18  SpO2 96%  Physical Exam  Constitutional: She appears well-developed.  HENT:  Head: Normocephalic.  Eyes: Pupils are equal, round, and reactive to light.  Cardiovascular:  Pulses:      Dorsalis pedis pulses are 1+ on the left side.  Musculoskeletal:       Left foot: She exhibits tenderness and swelling. She exhibits no crepitus and no deformity.       Feet:    ED Course  Procedures (including critical care time)  Labs Reviewed - No data to display Dg Foot Complete Left  07/09/2012  *RADIOLOGY REPORT*  Clinical Data: Injury and pain in the left foot.  LEFT FOOT - COMPLETE 3+ VIEW  Comparison: None.  Findings: Three views of the left foot were obtained.  There is a small spur along the plantar aspect of the calcaneus. Cannot exclude soft tissue swelling along the dorsal aspect of the midfoot.  Normal alignment of the bones without acute fracture or dislocation.  IMPRESSION: No  acute bony abnormality in the left foot.   Original Report Authenticated By: Markus Daft, M.D.      Diagnoses: 1. Foot contusion 2. Cellulitis    MDM  Patient comes to the ER for evaluation of pain and swelling of the left foot after trauma. Injury occurred a week ago. She reports that the swelling and bruising has improved, but now she has noticed some redness and increased warmth across the top of the foot. Examination does reveal small scab from the original impact of the can. I suspect there is some early cellulitis associated with this. Patient's vital signs are unremarkable. I believe she is a candidate to initiate outpatient treatment, return if the redness and swelling worsen.       Orpah Greek, MD 07/09/12 [phone]

## 2012-07-09 NOTE — ED Notes (Signed)
MD at bedside. 

## 2012-07-09 NOTE — ED Notes (Signed)
Pt report to dropping a can of green beans onto left foot.  Pt c/o foot and toe pain along with "redness and warmth" on top of foot.

## 2012-07-26 ENCOUNTER — Ambulatory Visit (INDEPENDENT_AMBULATORY_CARE_PROVIDER_SITE_OTHER): Payer: Medicare Other | Admitting: Ophthalmology

## 2012-07-26 DIAGNOSIS — E1139 Type 2 diabetes mellitus with other diabetic ophthalmic complication: Secondary | ICD-10-CM

## 2012-07-26 DIAGNOSIS — H43819 Vitreous degeneration, unspecified eye: Secondary | ICD-10-CM

## 2012-07-26 DIAGNOSIS — E11319 Type 2 diabetes mellitus with unspecified diabetic retinopathy without macular edema: Secondary | ICD-10-CM

## 2012-07-26 DIAGNOSIS — I10 Essential (primary) hypertension: Secondary | ICD-10-CM | POA: Diagnosis not present

## 2012-07-26 DIAGNOSIS — H35379 Puckering of macula, unspecified eye: Secondary | ICD-10-CM | POA: Diagnosis not present

## 2012-07-26 DIAGNOSIS — H35039 Hypertensive retinopathy, unspecified eye: Secondary | ICD-10-CM

## 2012-07-26 DIAGNOSIS — E1165 Type 2 diabetes mellitus with hyperglycemia: Secondary | ICD-10-CM | POA: Diagnosis not present

## 2012-07-27 DIAGNOSIS — L0390 Cellulitis, unspecified: Secondary | ICD-10-CM | POA: Diagnosis not present

## 2012-07-27 DIAGNOSIS — IMO0001 Reserved for inherently not codable concepts without codable children: Secondary | ICD-10-CM | POA: Diagnosis not present

## 2012-07-27 DIAGNOSIS — L0291 Cutaneous abscess, unspecified: Secondary | ICD-10-CM | POA: Diagnosis not present

## 2012-07-27 DIAGNOSIS — I10 Essential (primary) hypertension: Secondary | ICD-10-CM | POA: Diagnosis not present

## 2012-08-01 DIAGNOSIS — M25579 Pain in unspecified ankle and joints of unspecified foot: Secondary | ICD-10-CM | POA: Diagnosis not present

## 2012-08-03 DIAGNOSIS — IMO0001 Reserved for inherently not codable concepts without codable children: Secondary | ICD-10-CM | POA: Diagnosis not present

## 2012-08-03 DIAGNOSIS — G609 Hereditary and idiopathic neuropathy, unspecified: Secondary | ICD-10-CM | POA: Diagnosis not present

## 2012-08-03 DIAGNOSIS — I10 Essential (primary) hypertension: Secondary | ICD-10-CM | POA: Diagnosis not present

## 2012-08-03 DIAGNOSIS — E7800 Pure hypercholesterolemia, unspecified: Secondary | ICD-10-CM | POA: Diagnosis not present

## 2012-08-31 ENCOUNTER — Other Ambulatory Visit (HOSPITAL_COMMUNITY): Payer: Self-pay | Admitting: Internal Medicine

## 2012-08-31 ENCOUNTER — Ambulatory Visit (HOSPITAL_COMMUNITY)
Admission: RE | Admit: 2012-08-31 | Discharge: 2012-08-31 | Disposition: A | Discharge status: Home / Self Care | Payer: Medicare Other | Source: Ambulatory Visit | Attending: Internal Medicine | Admitting: Internal Medicine

## 2012-08-31 DIAGNOSIS — M79609 Pain in unspecified limb: Secondary | ICD-10-CM | POA: Diagnosis not present

## 2012-08-31 DIAGNOSIS — M25569 Pain in unspecified knee: Secondary | ICD-10-CM | POA: Diagnosis not present

## 2012-08-31 DIAGNOSIS — S99929A Unspecified injury of unspecified foot, initial encounter: Secondary | ICD-10-CM | POA: Diagnosis not present

## 2012-08-31 DIAGNOSIS — S8010XA Contusion of unspecified lower leg, initial encounter: Secondary | ICD-10-CM

## 2012-08-31 DIAGNOSIS — S99919A Unspecified injury of unspecified ankle, initial encounter: Secondary | ICD-10-CM | POA: Diagnosis not present

## 2012-08-31 DIAGNOSIS — I2510 Atherosclerotic heart disease of native coronary artery without angina pectoris: Secondary | ICD-10-CM | POA: Diagnosis not present

## 2012-08-31 DIAGNOSIS — M25559 Pain in unspecified hip: Secondary | ICD-10-CM | POA: Insufficient documentation

## 2012-08-31 DIAGNOSIS — S8012XA Contusion of left lower leg, initial encounter: Secondary | ICD-10-CM

## 2012-08-31 DIAGNOSIS — T07XXXA Unspecified multiple injuries, initial encounter: Secondary | ICD-10-CM | POA: Diagnosis not present

## 2012-08-31 DIAGNOSIS — S7010XA Contusion of unspecified thigh, initial encounter: Secondary | ICD-10-CM | POA: Diagnosis not present

## 2012-08-31 DIAGNOSIS — S7000XA Contusion of unspecified hip, initial encounter: Secondary | ICD-10-CM | POA: Diagnosis not present

## 2012-08-31 DIAGNOSIS — N949 Unspecified condition associated with female genital organs and menstrual cycle: Secondary | ICD-10-CM | POA: Diagnosis not present

## 2012-08-31 DIAGNOSIS — I10 Essential (primary) hypertension: Secondary | ICD-10-CM | POA: Diagnosis not present

## 2012-08-31 DIAGNOSIS — S8990XA Unspecified injury of unspecified lower leg, initial encounter: Secondary | ICD-10-CM | POA: Diagnosis not present

## 2012-08-31 DIAGNOSIS — R609 Edema, unspecified: Secondary | ICD-10-CM | POA: Diagnosis not present

## 2012-08-31 DIAGNOSIS — Z6828 Body mass index (BMI) 28.0-28.9, adult: Secondary | ICD-10-CM | POA: Diagnosis not present

## 2012-08-31 DIAGNOSIS — W309XXA Contact with unspecified agricultural machinery, initial encounter: Secondary | ICD-10-CM | POA: Insufficient documentation

## 2012-08-31 DIAGNOSIS — W208XXA Other cause of strike by thrown, projected or falling object, initial encounter: Secondary | ICD-10-CM | POA: Insufficient documentation

## 2012-09-05 ENCOUNTER — Telehealth: Payer: Self-pay | Admitting: Cardiology

## 2012-09-05 NOTE — Telephone Encounter (Signed)
Pt needs new rx called for Nitro Spray .4 ( her old one has expired)  Please call [facility] on [address]

## 2012-09-06 ENCOUNTER — Encounter: Payer: Self-pay | Admitting: *Deleted

## 2012-09-06 ENCOUNTER — Other Ambulatory Visit: Payer: Self-pay | Admitting: *Deleted

## 2012-09-06 MED ORDER — NITROGLYCERIN 0.4 MG/SPRAY TL SOLN: 1.0000 | Status: DC | PRN

## 2012-09-09 IMAGING — CR DG CHEST 2V
2 series · 2 of 2 positions shown · non-contrast
Comparison: Chest x-ray of 01/19/2011

CLINICAL DATA: Productive cough, congestion

CHEST - 2 VIEW

[view not recorded (1 of 2)]
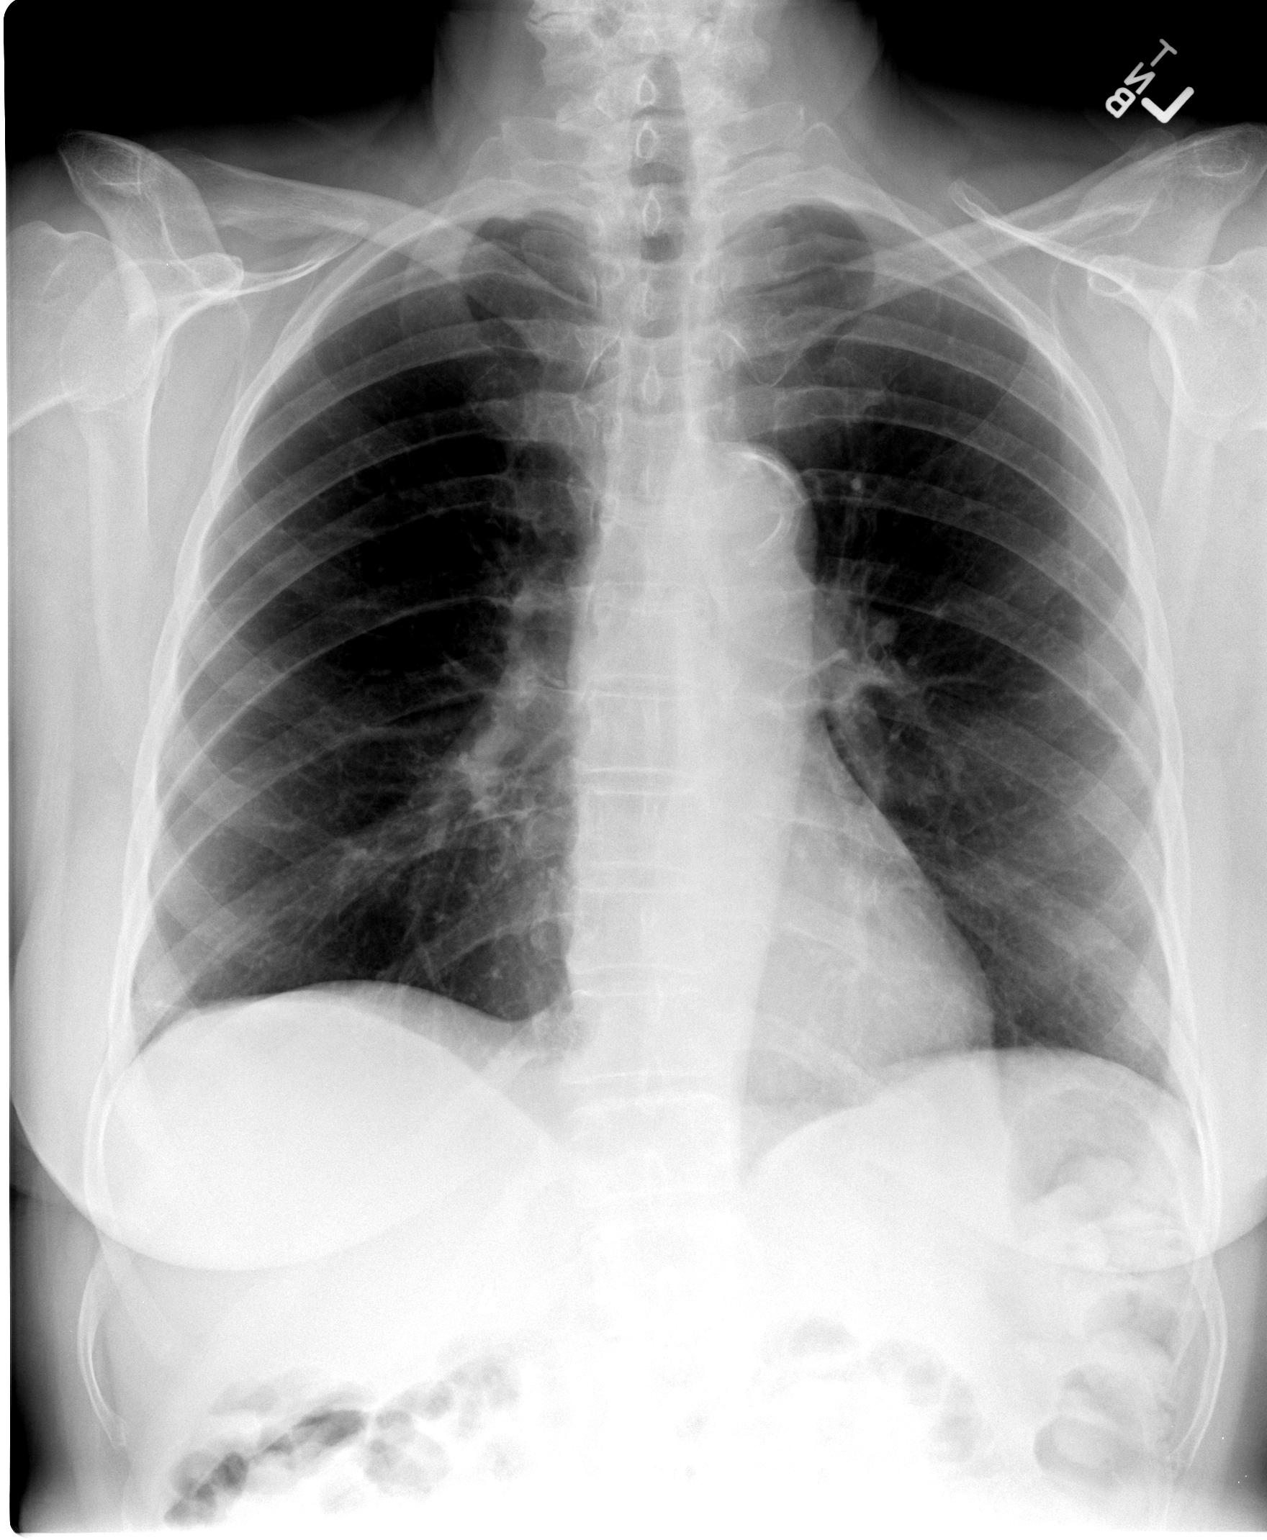

[view not recorded (2 of 2)]
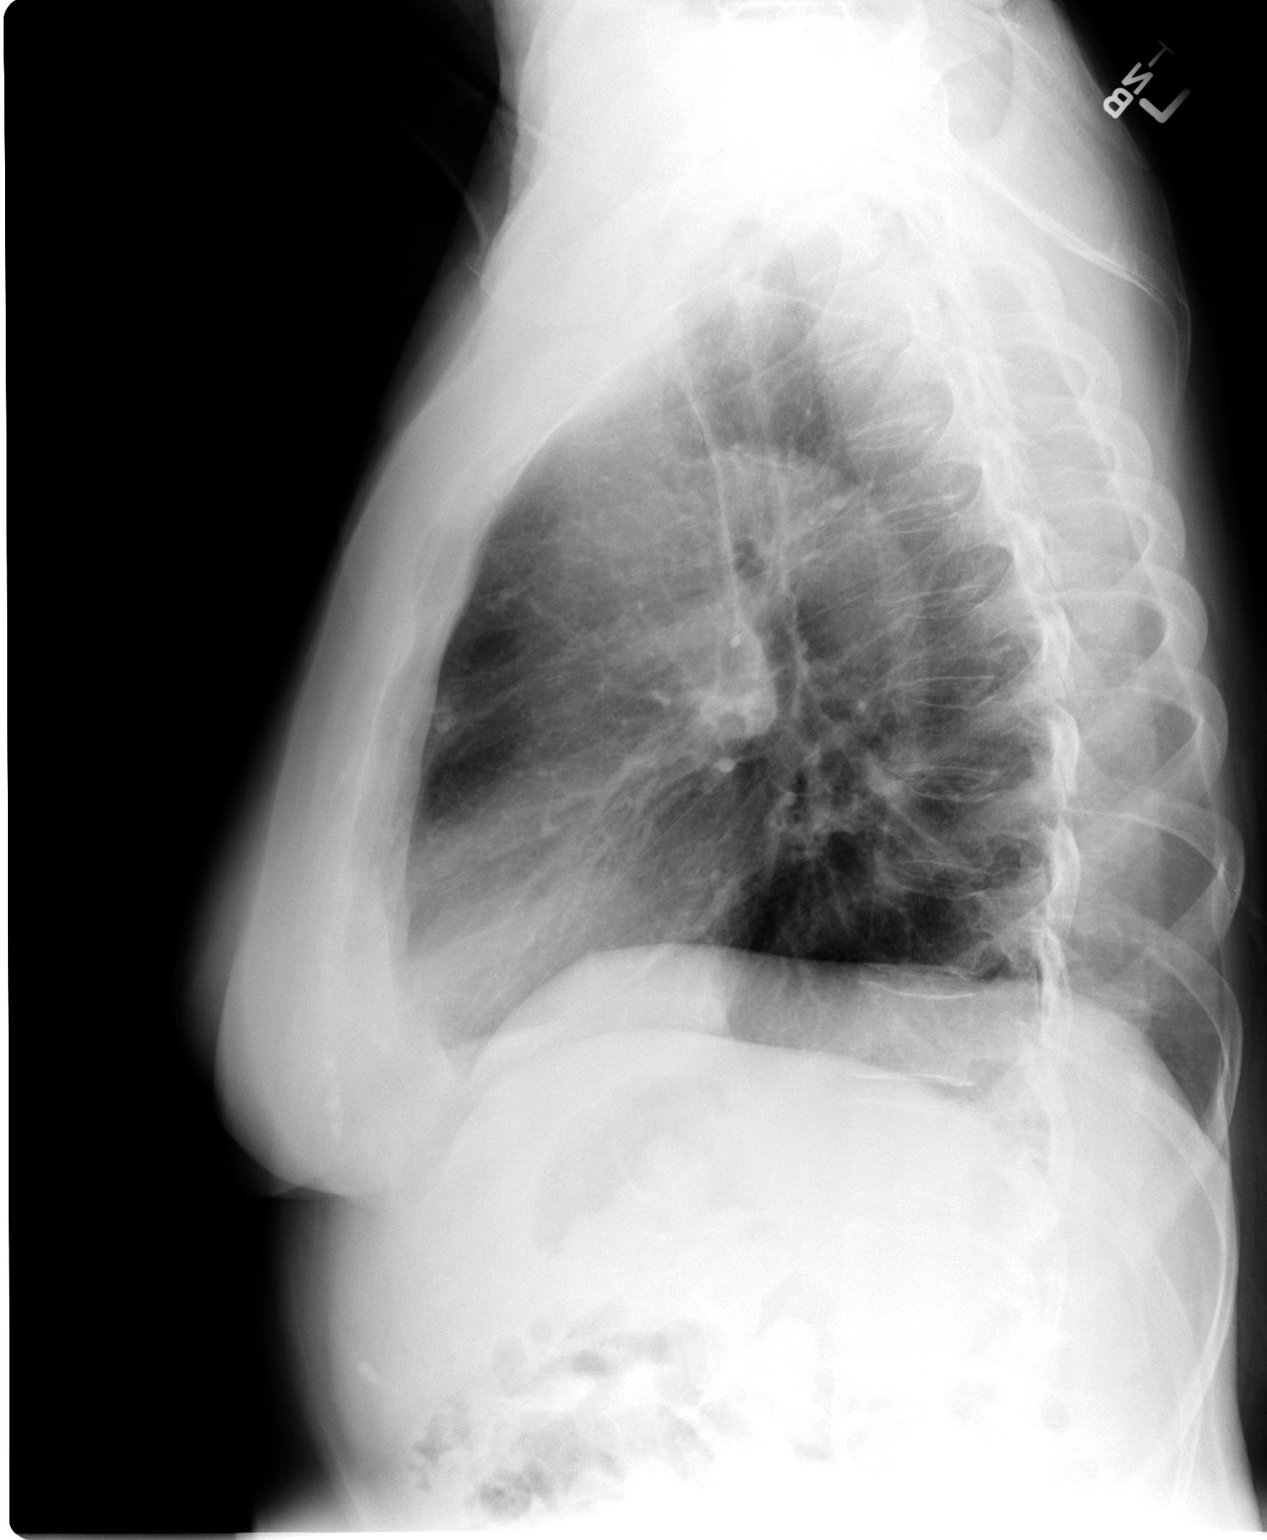

[2 of 2 positions shown; findings below may reference images not displayed]

FINDINGS: No active infiltrate or effusion is seen.  Mediastinal
contours appear normal.  The heart is within normal limits in size.
No bony abnormality is seen.
IMPRESSION: Stable chest x-ray.  No active lung disease.

## 2012-09-18 ENCOUNTER — Emergency Department (HOSPITAL_COMMUNITY)
Admission: EM | Admit: 2012-09-18 | Discharge: 2012-09-18 | Disposition: A | Discharge status: Home / Self Care | Payer: Medicare Other | Attending: Emergency Medicine | Admitting: Emergency Medicine

## 2012-09-18 DIAGNOSIS — M129 Arthropathy, unspecified: Secondary | ICD-10-CM | POA: Insufficient documentation

## 2012-09-18 DIAGNOSIS — Z7982 Long term (current) use of aspirin: Secondary | ICD-10-CM | POA: Insufficient documentation

## 2012-09-18 DIAGNOSIS — S8010XA Contusion of unspecified lower leg, initial encounter: Secondary | ICD-10-CM | POA: Diagnosis not present

## 2012-09-18 DIAGNOSIS — Z8589 Personal history of malignant neoplasm of other organs and systems: Secondary | ICD-10-CM | POA: Insufficient documentation

## 2012-09-18 DIAGNOSIS — Z9861 Coronary angioplasty status: Secondary | ICD-10-CM | POA: Diagnosis not present

## 2012-09-18 DIAGNOSIS — Z792 Long term (current) use of antibiotics: Secondary | ICD-10-CM | POA: Diagnosis not present

## 2012-09-18 DIAGNOSIS — Y9389 Activity, other specified: Secondary | ICD-10-CM | POA: Insufficient documentation

## 2012-09-18 DIAGNOSIS — G609 Hereditary and idiopathic neuropathy, unspecified: Secondary | ICD-10-CM | POA: Diagnosis not present

## 2012-09-18 DIAGNOSIS — L03119 Cellulitis of unspecified part of limb: Secondary | ICD-10-CM | POA: Insufficient documentation

## 2012-09-18 DIAGNOSIS — S8012XA Contusion of left lower leg, initial encounter: Secondary | ICD-10-CM

## 2012-09-18 DIAGNOSIS — Z7901 Long term (current) use of anticoagulants: Secondary | ICD-10-CM | POA: Insufficient documentation

## 2012-09-18 DIAGNOSIS — E119 Type 2 diabetes mellitus without complications: Secondary | ICD-10-CM | POA: Insufficient documentation

## 2012-09-18 DIAGNOSIS — L02419 Cutaneous abscess of limb, unspecified: Secondary | ICD-10-CM | POA: Diagnosis not present

## 2012-09-18 DIAGNOSIS — W28XXXA Contact with powered lawn mower, initial encounter: Secondary | ICD-10-CM | POA: Insufficient documentation

## 2012-09-18 DIAGNOSIS — Y929 Unspecified place or not applicable: Secondary | ICD-10-CM | POA: Insufficient documentation

## 2012-09-18 DIAGNOSIS — Z79899 Other long term (current) drug therapy: Secondary | ICD-10-CM | POA: Diagnosis not present

## 2012-09-18 DIAGNOSIS — I10 Essential (primary) hypertension: Secondary | ICD-10-CM | POA: Insufficient documentation

## 2012-09-18 DIAGNOSIS — L03116 Cellulitis of left lower limb: Secondary | ICD-10-CM

## 2012-09-18 MED ORDER — SULFAMETHOXAZOLE-TMP DS 800-160 MG PO TABS
1.0000 | ORAL_TABLET | Freq: Once | ORAL | Status: AC
  Administered 2012-09-18: 1 via ORAL
  Filled 2012-09-18: qty 1

## 2012-09-18 MED ORDER — OXYCODONE-ACETAMINOPHEN 5-325 MG PO TABS
1.0000 | ORAL_TABLET | Freq: Once | ORAL | Status: AC
  Administered 2012-09-18: 1 via ORAL
  Filled 2012-09-18: qty 1

## 2012-09-18 MED ORDER — OXYCODONE-ACETAMINOPHEN 5-325 MG PO TABS: 1.0000 | ORAL_TABLET | ORAL | Status: DC | PRN

## 2012-09-18 MED ORDER — SULFAMETHOXAZOLE-TRIMETHOPRIM 800-160 MG PO TABS: 1.0000 | ORAL_TABLET | Freq: Two times a day (BID) | ORAL | Status: DC

## 2012-09-18 NOTE — ED Notes (Signed)
Campos, MD at bedside.  

## 2012-09-18 NOTE — ED Notes (Signed)
Pt c/o of left leg swelling that started about 2-3 weeks ago when she had a lawn mower accident injuring her leg. Pts daughter also reports pt has fell on the way to the bathroom 1 week ago at 0200am.

## 2012-09-19 NOTE — ED Provider Notes (Signed)
History    CSN: [id number] Arrival date & time 09/18/12  1241  First MD Initiated Contact with Patient 09/18/12 1314     Chief Complaint  Patient presents with  . Left Leg Problem     The history is provided by the patient.   patient reports injuring her left lower extremity 2-3 weeks ago when she had her lawnmower rollover on her.  She initially had cellulitis of her left foot which seemed to be improving but now she's developed left anterior tibia is swelling and redness has worsened over the past several days.  She reports is a large tender mass right and 11.  The patient is on Coumadin.  She denies fevers and chills.  She reports the redness around her anterior tibia seems to be worsening.  No chest pain or shortness breath.  Symptoms are mild to moderate in severity    Past Medical History  Diagnosis Date  . Diabetes mellitus without complication   . Peripheral neuropathy   . Arthritis   . Cancer   . Hypertension    Past Surgical History  Procedure Laterality Date  . Abdominal hysterectomy    . Bladder suspension    . Appendectomy    . Heart stents    . Cholecystectomy     No family history on file. History  Substance Use Topics  . Smoking status: Never Smoker   . Smokeless tobacco: Not on file  . Alcohol Use: No   OB History   Grav Para Term Preterm Abortions TAB SAB Ect Mult Living                 Review of Systems  All other systems reviewed and are negative.    Allergies  Nsaids  Home Medications   Current Outpatient Rx  Name  Route  Sig  Dispense  Refill  . aspirin EC 325 MG tablet   Oral   Take 325 mg by mouth every evening.         . bethanechol (URECHOLINE) 25 MG tablet   Oral   Take 12.5-25 mg by mouth 2 (two) times daily. 1/2 tablet every night, and 1 tablet in the morning         . bumetanide (BUMEX) 1 MG tablet   Oral   Take 1 mg by mouth every morning.          . Calcium Carbonate-Vitamin D (CALCIUM 500 + D) 500-125 MG-UNIT  TABS   Oral   Take 1 capsule by mouth 2 (two) times daily.         . cholecalciferol (VITAMIN D) 1000 UNITS tablet   Oral   Take 2,000 Units by mouth daily.         . clopidogrel (PLAVIX) 75 MG tablet   Oral   Take 75 mg by mouth every morning.          . COD LIVER OIL PO   Oral   Take 1 tablet by mouth daily.         . diazepam (VALIUM) 5 MG tablet   Oral   Take 2.5-5 mg by mouth every 6 (six) hours as needed for anxiety. 2.5mg  every morning , and 5mg  at night before bedtime         . divalproex (DEPAKOTE) 500 MG DR tablet   Oral   Take 500 mg by mouth every morning.          . estrogens, conjugated, (PREMARIN) 0.3 MG tablet  Oral   Take 0.3 mg by mouth every evening. Take daily for 21 days then do not take for 7 days.         . Flaxseed, Linseed, (FLAX SEED OIL PO)   Oral   Take 1 tablet by mouth daily.         . isosorbide mononitrate (IMDUR) 30 MG 24 hr tablet   Oral   Take 30 mg by mouth every evening.          Marland Kitchen KRILL OIL PO   Oral   Take 1 capsule by mouth every morning.         Marland Kitchen losartan (COZAAR) 50 MG tablet   Oral   Take 50 mg by mouth every morning.         . metFORMIN (GLUCOPHAGE) 500 MG tablet   Oral   Take 500 mg by mouth 2 (two) times daily with a meal.         . metoprolol succinate (TOPROL-XL) 50 MG 24 hr tablet   Oral   Take 25 mg by mouth daily. Take with or immediately following a meal.         . metroNIDAZOLE (METROCREAM) 0.75 % cream   Topical   Apply topically 2 (two) times daily.         . Multiple Vitamins-Minerals (HAIR/SKIN/NAILS PO)   Oral   Take 1 capsule by mouth every morning.         . niacin (NIASPAN) 500 MG CR tablet   Oral   Take 500 mg by mouth at bedtime.         . nitroGLYCERIN (NITROLINGUAL) 0.4 MG/SPRAY spray   Sublingual   Place 1 spray under the tongue every 5 (five) minutes as needed for chest pain.   12 g   6   . Omega-3 Fatty Acids (FISH OIL) 1000 MG CAPS   Oral    Take 1 capsule by mouth 2 (two) times daily.          . pantoprazole (PROTONIX) 40 MG tablet   Oral   Take 40 mg by mouth 2 (two) times daily.         . rosuvastatin (CRESTOR) 10 MG tablet   Oral   Take 10 mg by mouth every evening.          . cephALEXin (KEFLEX) 500 MG capsule   Oral   Take 500 mg by mouth 2 (two) times daily. 7 day therapy course patient completed on 09/14/2012.         Marland Kitchen oxyCODONE-acetaminophen (PERCOCET/ROXICET) 5-325 MG per tablet   Oral   Take 1 tablet by mouth every 4 (four) hours as needed for pain.   20 tablet   0   . sulfamethoxazole-trimethoprim (SEPTRA DS) 800-160 MG per tablet   Oral   Take 1 tablet by mouth every 12 (twelve) hours.   14 tablet   0    BP 159/101  Pulse 70  Temp(Src) 98.1 F (36.7 C) (Oral)  Resp 16  SpO2 99% Physical Exam  Nursing note and vitals reviewed. Constitutional: She is oriented to person, place, and time. She appears well-developed and well-nourished. No distress.  HENT:  Head: Normocephalic and atraumatic.  Eyes: EOM are normal.  Neck: Normal range of motion.  Cardiovascular: Normal rate, regular rhythm and normal heart sounds.   Pulmonary/Chest: Effort normal and breath sounds normal.  Abdominal: Soft. She exhibits no distension. There is no tenderness.  Musculoskeletal: Normal range of motion.  Patient with evidence of fluctuant mass of her proximal anterior left tibia with surrounding erythema.  No obvious drainage.  Normal pulses in her left foot.  No significant swelling over left lower extremity.  Neurological: She is alert and oriented to person, place, and time.  Skin: Skin is warm and dry.  Psychiatric: She has a normal mood and affect. Judgment normal.    ED Course  Procedures (including critical care time)  INCISION AND DRAINAGE Performed by: Hoy Morn Consent: Verbal consent obtained. Risks and benefits: risks, benefits and alternatives were discussed Time out performed prior to  procedure Type: abscess Body area: Left anterior tibia Anesthesia: local infiltration Incision was made with a scalpel. Local anesthetic: lidocaine 2 % without epinephrine Anesthetic total: 4 ml Complexity: complex Blunt dissection to break up loculations Drainage: Bloody  Drainage amount: Moderate  Packing material: None  Patient tolerance: Patient tolerated the procedure well with no immediate complications.     Labs Reviewed - No data to display No results found. 1. Cellulitis of left lower extremity   2. Hematoma of left lower extremity     MDM  Redness was improved after incision and drainage of what is likely hematoma versus infected hematoma.  No frank purulence was met.  The patient will be placed on a short course of Bactrim.  Vas that the patient return the emergency department 3 days so that I can recheck her wound.  She understands to return to the ER sooner for new or worsening symptoms  Hoy Morn, MD 09/19/12 1131

## 2012-09-20 ENCOUNTER — Encounter (HOSPITAL_BASED_OUTPATIENT_CLINIC_OR_DEPARTMENT_OTHER): Payer: Medicare Other | Attending: General Surgery

## 2012-09-20 DIAGNOSIS — I2510 Atherosclerotic heart disease of native coronary artery without angina pectoris: Secondary | ICD-10-CM | POA: Insufficient documentation

## 2012-09-20 DIAGNOSIS — E119 Type 2 diabetes mellitus without complications: Secondary | ICD-10-CM | POA: Insufficient documentation

## 2012-09-20 DIAGNOSIS — I739 Peripheral vascular disease, unspecified: Secondary | ICD-10-CM | POA: Insufficient documentation

## 2012-09-20 DIAGNOSIS — X58XXXA Exposure to other specified factors, initial encounter: Secondary | ICD-10-CM | POA: Insufficient documentation

## 2012-09-20 DIAGNOSIS — S81809A Unspecified open wound, unspecified lower leg, initial encounter: Secondary | ICD-10-CM | POA: Diagnosis not present

## 2012-09-20 DIAGNOSIS — I10 Essential (primary) hypertension: Secondary | ICD-10-CM | POA: Insufficient documentation

## 2012-09-20 DIAGNOSIS — S81009A Unspecified open wound, unspecified knee, initial encounter: Secondary | ICD-10-CM | POA: Insufficient documentation

## 2012-09-20 DIAGNOSIS — Z79899 Other long term (current) drug therapy: Secondary | ICD-10-CM | POA: Diagnosis not present

## 2012-09-21 NOTE — Progress Notes (Signed)
[facility]  NAME:  [name], [name]                   ACCOUNT NO.:  [id number]  MEDICAL RECORD NO.:  [id number]      DATE OF BIRTH:  12/17/1940  PHYSICIAN:  Judene Companion, M.D.           VISIT DATE:                                  OFFICE VISIT   HISTORY OF PRESENT ILLNESS:  [name] is a 72 year old female who comes to [place] because she had an accident while mowing the lawn and struck her left leg and developed a hematoma.  Later in her doctor's office, the doctor under local anesthesia drained a hematoma, which helped the pain and swelling considerably.  PAST MEDICAL HISTORY:  She has a long history of peripheral vascular disease, coronary artery disease, and hypertension.  She is also a diabetic.  MEDICINES:  Losartan, MetroGel, metoprolol, Crestor, Cerefolin, Premarin, metformin, fish oil, calcium, aspirin, Depakote, Plavix, Nitrostat p.r.n., she also takes Bumex 1 mg a day, and isosorbide 30 mg a day.  PHYSICAL EXAMINATION:  When she came in here, we examined her and found her to be afebrile.  She had a blood pressure of 124/80, pulse 76, she weighs 175 pounds.  The wound is only about 1.5 cm long and it is draining serosanguineous fluid.  We packed it with silver alginate and wrapped her legs.  She will come back in a week.  DIAGNOSES: 1. Hematoma, left lower leg. 2. Diabetes. 3. Hypertension. 4. Coronary artery disease. 5. Peripheral vascular disease.     Judene Companion, M.D.     PP/MEDQ  D:  09/20/2012  T:  09/21/2012  Job:  [id number]

## 2012-09-27 ENCOUNTER — Ambulatory Visit: Payer: Self-pay | Admitting: Neurology

## 2012-09-27 ENCOUNTER — Encounter (HOSPITAL_BASED_OUTPATIENT_CLINIC_OR_DEPARTMENT_OTHER): Payer: Medicare Other | Attending: General Surgery

## 2012-09-27 DIAGNOSIS — S8990XA Unspecified injury of unspecified lower leg, initial encounter: Secondary | ICD-10-CM | POA: Diagnosis not present

## 2012-09-27 DIAGNOSIS — X58XXXA Exposure to other specified factors, initial encounter: Secondary | ICD-10-CM | POA: Insufficient documentation

## 2012-10-04 DIAGNOSIS — S8990XA Unspecified injury of unspecified lower leg, initial encounter: Secondary | ICD-10-CM | POA: Diagnosis not present

## 2012-10-11 DIAGNOSIS — S8990XA Unspecified injury of unspecified lower leg, initial encounter: Secondary | ICD-10-CM | POA: Diagnosis not present

## 2012-10-18 DIAGNOSIS — S8990XA Unspecified injury of unspecified lower leg, initial encounter: Secondary | ICD-10-CM | POA: Diagnosis not present

## 2012-10-25 ENCOUNTER — Ambulatory Visit (INDEPENDENT_AMBULATORY_CARE_PROVIDER_SITE_OTHER): Payer: Medicare Other | Admitting: Cardiology

## 2012-10-25 ENCOUNTER — Ambulatory Visit: Payer: Medicare Other | Admitting: Cardiology

## 2012-10-25 VITALS — BP 144/86 | HR 82 | Ht 67.0 in | Wt 178.7 lb

## 2012-10-25 DIAGNOSIS — E785 Hyperlipidemia, unspecified: Secondary | ICD-10-CM

## 2012-10-25 DIAGNOSIS — R079 Chest pain, unspecified: Secondary | ICD-10-CM

## 2012-10-25 DIAGNOSIS — F411 Generalized anxiety disorder: Secondary | ICD-10-CM

## 2012-10-25 DIAGNOSIS — F419 Anxiety disorder, unspecified: Secondary | ICD-10-CM

## 2012-10-25 DIAGNOSIS — I2510 Atherosclerotic heart disease of native coronary artery without angina pectoris: Secondary | ICD-10-CM

## 2012-10-25 DIAGNOSIS — S99929A Unspecified injury of unspecified foot, initial encounter: Secondary | ICD-10-CM | POA: Diagnosis not present

## 2012-10-25 DIAGNOSIS — I10 Essential (primary) hypertension: Secondary | ICD-10-CM

## 2012-10-25 DIAGNOSIS — Z9861 Coronary angioplasty status: Secondary | ICD-10-CM

## 2012-10-25 DIAGNOSIS — R609 Edema, unspecified: Secondary | ICD-10-CM

## 2012-10-25 DIAGNOSIS — S8990XA Unspecified injury of unspecified lower leg, initial encounter: Secondary | ICD-10-CM | POA: Diagnosis not present

## 2012-10-25 DIAGNOSIS — R600 Localized edema: Secondary | ICD-10-CM

## 2012-10-25 MED ORDER — METOPROLOL SUCCINATE ER 50 MG PO TB24: 50.0000 mg | ORAL_TABLET | Freq: Every day | ORAL | Status: DC

## 2012-10-25 MED ORDER — BUMETANIDE 1 MG PO TABS: ORAL_TABLET | ORAL | Status: DC

## 2012-10-25 MED ORDER — ISOSORBIDE MONONITRATE ER 60 MG PO TB24: 60.0000 mg | ORAL_TABLET | Freq: Every day | ORAL | Status: DC

## 2012-10-25 NOTE — Patient Instructions (Addendum)
Daily weights if weight goes up more than 3 pounds the take an extra Bumex.   Take 2 Bumex  a day for one week then weight yourself on Monday. This is your dry weight. If your weight goes up for than 3 lbs in a day take an extra Bumex.   Take Metoprolol 50 mg  then 25 mg alternating every other night for 6 nights . If you have no issues increase to 50 mg  Increase Isosorbide MN 60 mg take at noon time.  Talk to [name] about your social issues  .Your physician wants you to follow-up in 3 months [name].  You will receive a reminder letter in the mail two months in advance. If you don't receive a letter, please call our office to schedule the follow-up appointment.

## 2012-10-28 ENCOUNTER — Encounter: Payer: Self-pay | Admitting: Cardiology

## 2012-10-28 DIAGNOSIS — E785 Hyperlipidemia, unspecified: Secondary | ICD-10-CM | POA: Insufficient documentation

## 2012-10-28 DIAGNOSIS — R600 Localized edema: Secondary | ICD-10-CM | POA: Insufficient documentation

## 2012-10-28 DIAGNOSIS — F419 Anxiety disorder, unspecified: Secondary | ICD-10-CM | POA: Insufficient documentation

## 2012-10-28 DIAGNOSIS — I2510 Atherosclerotic heart disease of native coronary artery without angina pectoris: Secondary | ICD-10-CM | POA: Insufficient documentation

## 2012-10-28 DIAGNOSIS — I10 Essential (primary) hypertension: Secondary | ICD-10-CM | POA: Insufficient documentation

## 2012-10-28 DIAGNOSIS — R079 Chest pain, unspecified: Secondary | ICD-10-CM | POA: Insufficient documentation

## 2012-10-28 NOTE — Assessment & Plan Note (Signed)
On Crestor, visual and Niaspan.  Followed by Dr. Fernande Boyden

## 2012-10-28 NOTE — Assessment & Plan Note (Signed)
Has been stable according to their recordings at home.  On her current medications 1 losartan.  But the idea blood pressure her to increase her metoprolol dose.

## 2012-10-28 NOTE — Assessment & Plan Note (Signed)
I'm not sure how much this has a good her cardiac function.  EF has been fine she'll a mild grade 1 diastolic dysfunction with no PND or orthopnea.  She could be having some discomfort in her chest because of some diastolic dysfunction therefore we'll go ahead and increase her diuretic.    Plan:Take 2 Bumex  a day for one week then weight yourself on Monday. This is your dry weight. Daily weights if weight goes up more than 3 pounds the take an extra Bumex.

## 2012-10-28 NOTE — Assessment & Plan Note (Signed)
She's definitely anxious and nervous, and I think this is related to the need to address with her Primary Physician.  I spent well over 30 minutes beyond simply talking about her cardiac issues discussing the concerns of her anxieties and concerns for dementia et Ronney Asters.  We also talked about whether or not she ought to be on as SSRI medication.  She relates her problems are those of the past and is very reluctant to do that. She is even more stress than usual after her accident.

## 2012-10-28 NOTE — Assessment & Plan Note (Signed)
As per above.  Increase beta blocker dose and Imdur dose. These to be more related to palpitations than actual pains/angina.

## 2012-10-28 NOTE — Progress Notes (Signed)
Patient ID: [name], female   DOB: 1941-02-12, 72 y.o.   MRN: [id number]  PCP: Glo Herring., MD  Nephrologist: [name]  Clinic Note: Chief Complaint  Patient presents with  . 6 month visit    chest pain use NTG with relief,sob" part of it is do my condition",flipped lawn mower on herself @6 -8 weeks, unable do what she usually,goes to wound clinc   HPI: [name] is a 72 y.o. female with a PMH below who presents today for evaluation of discomfort in her chest.  She is a long-term patient of Dr. Aldona Bar who I saw for the first time back in February of this year.  At that time I made a few adjustments to her medications that seem to be relatively minor she was restarted ARB but due to insurance issues was switched to losartan.  She may also back down her metoprolol dose.  She says that since that time she will await a lot of edema.  She saw [name] who told her to double per Lasix for about a week and that helped significantly.  She is also been restarted on Benicar.  She brings in a list of her blood pressures have been fairly well controlled in the 118 120 range up to about 150 to 165 and sometimes.  Her weight to be stable in the 174-175 pound Range.  Of note:She presented emergency room at [facility] back on June 23 for a fall where she hurt her left leg.  Essentially she was riding her lawnmower and got rolled over.  She denies to get hit by the blade.  Interval History:  She's noted a little discomfort in of palpitations on Flip-flopping in her chest that come and go.  Intermittent he did decrease with nitroglycerin.  They do not occur with exertion.  A lot of this comes in relation to the fact that she is under lots and lots of social stress.  She basically is raised with some her grandkids above the the vagina and her daughter died in a car accident.  [name]  She is taking a lot of this to heart and it's really cause her to be an  under significant stress. As she's been quite incapacitated following her injuries from her lawnmower accident.  She is unable to do what she used to doing.  She has noted slightly worse edema, and feel of it falling overloaded.  Not significant enough to have symptoms of PND or orthopnea however.  She denies any true chest pain or pressure with exertion.  She denies any dyspnea with rest or exertion.  She denies any TIA or amaurosis fugax symptoms.  She does have does mild palpitation bursts but had little more frequent of late, but not overwhelming and do not lead to a sensation of lightheadedness, dizziness or syncope/near syncope. She is a little wobbly with her gait do to pain in her left knee from her injury. Despite her injuries, she has not had any significant bleeds.  Past Medical History  Diagnosis Date  . CAD S/P percutaneous coronary angioplasty 2007; March 2011    [facility] 3.0 mm 20 mm postdilated 3.6 mm in early mid LAD; status post ISR Cutting Balloon PTCA and March 11 along with PCI of distal mid lesion with a 3.0 mm 12 mm MultiLink vision BMS; the proximal stent causes jailing of SP1 and SP2 with ostial 70-80% lesions  .  Diabetes mellitus type 2 with neurological manifestations   . Peripheral neuropathy   . Hypertension, essential, benign   . Dyslipidemia, goal LDL below 70   . Cancer      unspecified  . Arthritis      osteoarthritis  . Anxiety   . History of hematuria     Followed by Dr. Gaynelle Arabian    Prior Cardiac Evaluation and Past Surgical History: Past Surgical History  Procedure Laterality Date  . Abdominal hysterectomy    . Bladder suspension    . Appendectomy    . Cholecystectomy    . Coronary stent placement  2007; March 2011    Early mid LAD: Liberte' 3.0 mm x 20 mm BMS postdilated to 3.6 --> Cutting Balloon PTCA for ISR March 11.  Disability 3.0 mm 12 mm MultiLink vision  . Cardiac catheterization  April 2011    Widely patent stents with ostial  stenosis of SP1 and SP 2  . Nm myoview ltd  August 2013    5 beats a PVC during recovery.  No skin or infarction.  Reached 5 METs   . Doppler echocardiography  October 2011    Normal LV size and function, EF 55 6%.  Grade 1 diastolic dysfunction with mild TR    Allergies  Allergen Reactions  . Nsaids     swelling    Current Outpatient Prescriptions  Medication Sig Dispense Refill  . aspirin EC 325 MG tablet Take 325 mg by mouth every evening.      . bethanechol (URECHOLINE) 25 MG tablet Take 12.5-25 mg by mouth 2 (two) times daily. 1/2 tablet every night, and 1 tablet in the morning      . bumetanide (BUMEX) 1 MG tablet Take 1 mg by mouth every morning.       . Calcium Carbonate-Vitamin D (CALCIUM 500 + D) 500-125 MG-UNIT TABS Take 1 capsule by mouth 2 (two) times daily.      . cholecalciferol (VITAMIN D) 1000 UNITS tablet Take 2,000 Units by mouth daily.      . clopidogrel (PLAVIX) 75 MG tablet Take 75 mg by mouth every morning.       . COD LIVER OIL PO Take 1 tablet by mouth daily.      . diazepam (VALIUM) 5 MG tablet Take 2.5-5 mg by mouth every 6 (six) hours as needed for anxiety. 2.5mg  every morning , and 5mg  at night before bedtime      . divalproex (DEPAKOTE) 500 MG DR tablet Take 500 mg by mouth every morning.       . estrogens, conjugated, (PREMARIN) 0.3 MG tablet Take 0.3 mg by mouth every evening. Take daily for 21 days then do not take for 7 days.      . Flaxseed, Linseed, (FLAX SEED OIL PO) Take 1 tablet by mouth daily.      . isosorbide mononitrate (IMDUR) 30 MG 24 hr tablet Take 30 mg by mouth every evening.       Marland Kitchen KRILL OIL PO Take 1 capsule by mouth every morning.      Marland Kitchen losartan (COZAAR) 50 MG tablet Take 50 mg by mouth every morning.      . metFORMIN (GLUCOPHAGE) 500 MG tablet Take 500 mg by mouth 2 (two) times daily with a meal.      . metoprolol succinate (TOPROL-XL) 50 MG 24 hr tablet Take 25 mg by mouth daily. Take with or immediately following a meal.      .  metroNIDAZOLE (METROCREAM) 0.75 % cream Apply topically 2 (two) times daily.      . Multiple Vitamins-Minerals (HAIR/SKIN/NAILS PO) Take 1 capsule by mouth every morning.      . niacin (NIASPAN) 500 MG CR tablet Take 500 mg by mouth at bedtime.      . nitroGLYCERIN (NITROLINGUAL) 0.4 MG/SPRAY spray Place 1 spray under the tongue every 5 (five) minutes as needed for chest pain.  12 g  6  . Omega-3 Fatty Acids (FISH OIL) 1000 MG CAPS Take 1 capsule by mouth 2 (two) times daily.       Marland Kitchen oxyCODONE-acetaminophen (PERCOCET/ROXICET) 5-325 MG per tablet Take 1 tablet by mouth every 4 (four) hours as needed for pain.  20 tablet  0  . pantoprazole (PROTONIX) 40 MG tablet Take 40 mg by mouth 2 (two) times daily.      . rosuvastatin (CRESTOR) 10 MG tablet Take 10 mg by mouth every evening.       . urea (CARMOL) 40 % CREA Apply topically daily.      . bumetanide (BUMEX) 1 MG tablet Take 1 to 2 tablet a day or as directed  180 tablet  3  . isosorbide mononitrate (IMDUR) 60 MG 24 hr tablet Take 1 tablet (60 mg total) by mouth daily.  90 tablet  3  . Methylfol-Methylcob-Acetylcyst (CEREFOLIN NAC) 6-2-600 MG TABS Take by mouth.      . metoprolol succinate (TOPROL-XL) 50 MG 24 hr tablet Take 1 tablet (50 mg total) by mouth daily. Take with or immediately following a meal.  90 tablet  3   No current facility-administered medications for this visit.   at time of visit patient was taken one half of the metoprolol dose daily, as well as one half of the Imdur dose  History   Social History  . Marital Status: Married    Spouse Name: N/A    Number of Children: N/A  . Years of Education: N/A   Occupational History  . Not on file.   Social History Main Topics  . Smoking status: Never Smoker   . Smokeless tobacco: Not on file  . Alcohol Use: No  . Drug Use: Not on file  . Sexually Active: Not on file   Other Topics Concern  . Not on file   Social History Narrative   She is a married mother of 17,  grandmother of 2.  Usually very active and out about the house.  But currently over last 6-8 weeks has been incapacitated.  She never smoked and does not drink alcohol.    ROS: A comprehensive Review of Systems - Negative except Pertinent positives noted above and noncardiac situation below. she has bandages and bruises all over her arms and legs most notably the left leg.  Her back hurts from a fall.  She has a significant amount of anxiety and stress going on.  Her husband is very concerned that she has some baseline dementia.  She has some memory issues and is sometimes perseverating on small things that have long since passed.  He is not sure how she managed to flip the right lower when she was only on the flat part of the lawn.     PHYSICAL EXAM BP 144/86  Pulse 82  Ht 5\' 7"  (1.702 m)  Wt 178 lb 11.2 oz (81.058 kg)  BMI 27.98 kg/m2 General appearance: alert, cooperative, appears stated age, no distress and Very anxious and almost tearful.  But otherwise healthy-appearing.  She  does have multiple bandages and the walking with a somewhat antalgic gait from her back and leg pain.  She does seem frazzled as he was before.  She is well-groomed and well-nourished. Neck: no adenopathy, no carotid bruit and no JVD Lungs: clear to auscultation bilaterally, normal percussion bilaterally and Nonlabored, good air movement Heart: regular rate and rhythm, S1, S2 normal, no murmur, click, rub or gallop and normal apical impulse Abdomen: Soft mildly tender along the right flank there was a bruise.  Nondistended, normal active bowel sounds.  No organomegaly noted. Extremities: edema 2+ bilateral lower extremity and Left knee has bandages in place also her forearms as well. Pulses: 2+ and symmetric Neurologic: Mental status: Alert, oriented, thought content appropriate, affect: increased in intensity, labile and Quite anxious, Easily distractible, tearful Cranial nerves: normal  [id number] today:  Yes Rate:82 , Rhythm:  normal sinus rhythm, with ECG  Recent Labs 08/03/12:   Total cholesterol 180, HDL 92, triglycerides 114, LDL 65.  Sodium 134, potassium 4.0, chloride 97, bicarbonate 32, BUN 16, creatinine 1.1, glucose 92, calcium 9  TSH 1.91  ASSESSMENT / PLAN: Relatively stable elderly woman.     CAD S/P percutaneous coronary angioplasty I'm not sure to make of these episodes she is having.  She does have palpitations but that usually occur at rest and are not associated with exertion.  It doesn't sound much like angina.  I think a lot of it is just anxiety related to her feeling cooped up after her injuries.  Plan: Increase beta blocker dose to 50 twice a day as these seem to be somewhat related to palpitations.  Also increase Imdur to 60 mg to take at noon as opposed to at bedtime because her symptoms are worse during the evening. She is also on aspirin Plavix as well as ARB and statin.  Chest pain at rest As per above.  Increase beta blocker dose and Imdur dose. These to be more related to palpitations than actual pains/angina.  Hypertension, essential, benign Has been stable according to their recordings at home.  On her current medications 1 losartan.  But the idea blood pressure her to increase her metoprolol dose.  Dyslipidemia, goal LDL below 70 On Crestor, visual and Niaspan.  Followed by Dr. Fernande Boyden  Anxiety She's definitely anxious and nervous, and I think this is related to the need to address with her Primary Physician.  I spent well over 30 minutes beyond simply talking about her cardiac issues discussing the concerns of her anxieties and concerns for dementia et Ronney Asters.  We also talked about whether or not she ought to be on as SSRI medication.  She relates her problems are those of the past and is very reluctant to do that. She is even more stress than usual after her accident.   Edema of both legs I'm not sure how much this has a good her cardiac function.  EF  has been fine she'll a mild grade 1 diastolic dysfunction with no PND or orthopnea.  She could be having some discomfort in her chest because of some diastolic dysfunction therefore we'll go ahead and increase her diuretic.    Plan:Take 2 Bumex  a day for one week then weight yourself on Monday. This is your dry weight. Daily weights if weight goes up more than 3 pounds the take an extra Bumex.   Total time with patient: 55 minutes Orders Placed This Encounter  Procedures  . EKG 12-Lead   Followup: 3 months  Duilio Heritage W. Ellyn Hack, M.D., M.S. [facility] [address] [place], [place]  [id number]  [phone] Pager # [phone]

## 2012-10-28 NOTE — Assessment & Plan Note (Addendum)
I'm not sure to make of these episodes she is having.  She does have palpitations but that usually occur at rest and are not associated with exertion.  It doesn't sound much like angina.  I think a lot of it is just anxiety related to her feeling cooped up after her injuries.  Plan: Increase beta blocker dose to 50 twice a day as these seem to be somewhat related to palpitations.  Also increase Imdur to 60 mg to take at noon as opposed to at bedtime because her symptoms are worse during the evening. She is also on aspirin Plavix as well as ARB and statin.

## 2012-10-31 DIAGNOSIS — M25569 Pain in unspecified knee: Secondary | ICD-10-CM | POA: Diagnosis not present

## 2012-11-01 ENCOUNTER — Encounter (HOSPITAL_BASED_OUTPATIENT_CLINIC_OR_DEPARTMENT_OTHER): Payer: Medicare Other | Attending: General Surgery

## 2012-11-01 DIAGNOSIS — S8990XA Unspecified injury of unspecified lower leg, initial encounter: Secondary | ICD-10-CM | POA: Diagnosis not present

## 2012-11-01 DIAGNOSIS — X58XXXA Exposure to other specified factors, initial encounter: Secondary | ICD-10-CM | POA: Insufficient documentation

## 2012-11-02 ENCOUNTER — Telehealth: Payer: Self-pay | Admitting: *Deleted

## 2012-11-02 NOTE — Telephone Encounter (Signed)
Spoke to patient.  Received information from [facility] concerning unavailability of Bumetandine at present time Per Dr Horald Pollen the prescription filled local instead of changing medication.  Patient was aware.She wanted a prescription called to [facility] [address]. Rx called into pharmacy --#60 bumetandine tabs.  Sig-- 1 to 2 tablets a day or as directed with 3 refill

## 2012-11-08 DIAGNOSIS — S99929A Unspecified injury of unspecified foot, initial encounter: Secondary | ICD-10-CM | POA: Diagnosis not present

## 2012-11-08 DIAGNOSIS — S8990XA Unspecified injury of unspecified lower leg, initial encounter: Secondary | ICD-10-CM | POA: Diagnosis not present

## 2012-11-22 DIAGNOSIS — S8990XA Unspecified injury of unspecified lower leg, initial encounter: Secondary | ICD-10-CM | POA: Diagnosis not present

## 2012-11-30 ENCOUNTER — Other Ambulatory Visit: Payer: Self-pay | Admitting: Cardiovascular Disease

## 2012-11-30 NOTE — Telephone Encounter (Signed)
Rx was sent to pharmacy electronically. 

## 2012-12-01 ENCOUNTER — Encounter: Payer: Self-pay | Admitting: Neurology

## 2012-12-06 ENCOUNTER — Encounter: Payer: Self-pay | Admitting: Neurology

## 2012-12-06 ENCOUNTER — Ambulatory Visit (INDEPENDENT_AMBULATORY_CARE_PROVIDER_SITE_OTHER): Payer: Medicare Other | Admitting: Neurology

## 2012-12-06 VITALS — BP 159/92 | HR 72 | Temp 98.0°F | Ht 66.0 in | Wt 178.0 lb

## 2012-12-06 DIAGNOSIS — F0390 Unspecified dementia without behavioral disturbance: Secondary | ICD-10-CM

## 2012-12-06 NOTE — Patient Instructions (Addendum)
Resume Cerefolin nac one tablet daily as patient felt better on it. I gave her samples and a prescription [facility].Follow up in 3 months with Jeani Hawking, NP

## 2012-12-06 NOTE — Progress Notes (Signed)
[facility] [address] [place]. [place] [address] [phone]       OFFICE FOLLOW-UP NOTE  Kristen. 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  Update 12/06/2012 [name] returns for f/u after last vist 06/07/2012.Marland KitchenShe has stopped cerefolin nac as insurance is not paying for it .Memory loss seems stable but she feels subjectively she was doing better on Cerefolin nac.she recently fell of a tractor in her yard and hurt her foot but is improving. She continues to have poor recent memory but is able to recall later.She decided not to do expedition 3 study upon advise from [name] her cardiologist.  ROS:   14 system review of systems is positive for no complaints  PMH:  Past Medical History  Diagnosis Date  . CAD S/P percutaneous coronary angioplasty 2007; March 2011    [facility] 3.0 mm 20 mm postdilated 3.6 mm in early mid LAD; status post ISR Cutting Balloon PTCA and March 11 along with PCI of distal mid lesion with a 3.0 mm 12 mm MultiLink vision BMS; the proximal stent causes jailing of SP1 and SP2 with ostial 70-80% lesions  . Diabetes mellitus type 2 with neurological manifestations   . Peripheral neuropathy   . Hypertension, essential, benign   . Dyslipidemia, goal LDL below 70   . Cancer      unspecified  . Arthritis      osteoarthritis  . Anxiety   . History of hematuria     Followed by Dr. Gaynelle Arabian    Social History:  History   Social History  . Marital Status: Married    Spouse Name: N/A    Number of Children: N/A  . Years of Education: N/A   Occupational History  . Not on file.   Social History Main Topics  . Smoking status: Never Smoker   . Smokeless tobacco: Not on file  . Alcohol Use: No  . Drug Use: Not on file  . Sexual Activity: Not on file   Other Topics Concern  . Not on file   Social History Narrative   She is a  married mother of 70, grandmother of 2.  Usually very active and out about the house.  But currently over last 6-8 weeks has been incapacitated.  She never smoked and does not drink alcohol.    Medications:   Current Outpatient Prescriptions on File Prior to Visit  Medication Sig Dispense Refill  . aspirin EC 325 MG tablet Take 325 mg by mouth every evening.      . bethanechol (URECHOLINE) 25 MG tablet Take 25 mg by mouth daily at 12 noon. 1/2 tablet every night, and 1 tablet in the morning      . bumetanide (BUMEX) 1 MG tablet Take 1 to 2 tablet a day or as directed  180 tablet  3  . cholecalciferol (VITAMIN D) 1000 UNITS tablet Take 2,000 Units by mouth daily.      . clopidogrel (PLAVIX) 75 MG tablet Take 75 mg by mouth every morning.       . COD LIVER OIL PO Take 1 tablet by mouth daily.      . diazepam (VALIUM) 5 MG tablet Take 2.5-5 mg by mouth 2 (two) times daily. 2.5mg  every morning , and 5mg  at night before bedtime      . divalproex (DEPAKOTE) 500 MG DR tablet Take 500  mg by mouth every morning.       . estrogens, conjugated, (PREMARIN) 0.3 MG tablet Take 0.3 mg by mouth every evening. Take daily for 21 days then do not take for 7 days.      . Flaxseed, Linseed, (FLAX SEED OIL PO) Take 1 tablet by mouth 2 (two) times daily.       Marland Kitchen KRILL OIL PO Take 1 capsule by mouth every morning.      Marland Kitchen losartan (COZAAR) 50 MG tablet Take 50 mg by mouth every morning.      . metFORMIN (GLUCOPHAGE) 500 MG tablet Take 500 mg by mouth 2 (two) times daily with a meal.      . metoprolol succinate (TOPROL-XL) 50 MG 24 hr tablet Take 1 tablet (50 mg total) by mouth daily. Take with or immediately following a meal.  90 tablet  3  . metroNIDAZOLE (METROCREAM) 0.75 % cream Apply topically 2 (two) times daily.      . Multiple Vitamins-Minerals (HAIR/SKIN/NAILS PO) Take 1 capsule by mouth every morning.      Marland Kitchen NIASPAN 500 MG CR tablet TAKE 1 TABLET AT BEDTIME  90 tablet  3  . nitroGLYCERIN (NITROLINGUAL) 0.4  MG/SPRAY spray Place 1 spray under the tongue every 5 (five) minutes as needed for chest pain.  12 g  6  . Omega-3 Fatty Acids (FISH OIL) 1000 MG CAPS Take 1 capsule by mouth 2 (two) times daily.       . pantoprazole (PROTONIX) 40 MG tablet Take 40 mg by mouth 2 (two) times daily.      . rosuvastatin (CRESTOR) 10 MG tablet Take 10 mg by mouth every evening.       . urea (CARMOL) 40 % CREA Apply topically daily.      . Methylfol-Methylcob-Acetylcyst (CEREFOLIN NAC) 6-2-600 MG TABS Take by mouth.       No current facility-administered medications on file prior to visit.    Allergies:   Allergies  Allergen Reactions  . Nsaids     swelling    Physical Exam General: well developed, well nourished, seated, in no evident distress Head: head normocephalic and atraumatic. Orohparynx benign Neck: supple with no carotid or supraclavicular bruits Cardiovascular: regular rate and rhythm, no murmurs Musculoskeletal: no deformity Skin:  no rash/petichiae Vascular:  Normal pulses all extremities Filed Vitals:   12/06/12 1435  BP: 159/92  Pulse: 72  Temp: 98 F (36.7 C)    Neurologic Exam Mental Status: Awake and fully alert. Oriented to place and time. Recent and remote memory intact. Attention span, concentration and fund of knowledge appropriate. Mood and affect appropriate. MMSE 29/30.1 deficit in recall. AFT 10. Clock Drawing 4/4. Cranial Nerves: Fundoscopic exam reveals sharp disc margins. Pupils equal, briskly reactive to light. Extraocular movements full without nystagmus. Visual fields full to confrontation. Hearing intact. Facial sensation intact. Face, tongue, palate moves normally and symmetrically.  Motor: Normal bulk and tone. Normal strength in all tested extremity muscles. Sensory.: intact to tough and pinprick and vibratory.  Coordination: Rapid alternating movements normal in all extremities. Finger-to-nose and heel-to-shin performed accurately bilaterally. Gait and Station:  Arises from chair without difficulty. Stance is normal. Gait demonstrates normal stride length and balance . Able to heel, toe and tandem walk without difficulty.  Reflexes: 1+ and symmetric. Toes downgoing.      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    PLAN: Resume Cerefolin nac one tablet daily as patient felt better  on it. I gave her samples and a prescription [facility].Follow up in 3 months with Jeani Hawking, NP

## 2012-12-13 ENCOUNTER — Encounter (HOSPITAL_BASED_OUTPATIENT_CLINIC_OR_DEPARTMENT_OTHER): Payer: Medicare Other | Attending: General Surgery

## 2012-12-13 DIAGNOSIS — X58XXXA Exposure to other specified factors, initial encounter: Secondary | ICD-10-CM | POA: Insufficient documentation

## 2012-12-13 DIAGNOSIS — S8990XA Unspecified injury of unspecified lower leg, initial encounter: Secondary | ICD-10-CM | POA: Diagnosis not present

## 2012-12-27 ENCOUNTER — Ambulatory Visit (INDEPENDENT_AMBULATORY_CARE_PROVIDER_SITE_OTHER): Payer: Medicare Other | Admitting: Cardiology

## 2012-12-27 ENCOUNTER — Encounter: Payer: Self-pay | Admitting: Cardiology

## 2012-12-27 VITALS — BP 138/68 | HR 86 | Ht 66.5 in | Wt 178.1 lb

## 2012-12-27 DIAGNOSIS — Z9861 Coronary angioplasty status: Secondary | ICD-10-CM

## 2012-12-27 DIAGNOSIS — R609 Edema, unspecified: Secondary | ICD-10-CM

## 2012-12-27 DIAGNOSIS — R079 Chest pain, unspecified: Secondary | ICD-10-CM

## 2012-12-27 DIAGNOSIS — R55 Syncope and collapse: Secondary | ICD-10-CM

## 2012-12-27 DIAGNOSIS — E785 Hyperlipidemia, unspecified: Secondary | ICD-10-CM

## 2012-12-27 DIAGNOSIS — R600 Localized edema: Secondary | ICD-10-CM

## 2012-12-27 DIAGNOSIS — I2510 Atherosclerotic heart disease of native coronary artery without angina pectoris: Secondary | ICD-10-CM

## 2012-12-27 DIAGNOSIS — Z79899 Other long term (current) drug therapy: Secondary | ICD-10-CM | POA: Diagnosis not present

## 2012-12-27 DIAGNOSIS — I10 Essential (primary) hypertension: Secondary | ICD-10-CM

## 2012-12-27 MED ORDER — LOSARTAN POTASSIUM 100 MG PO TABS: 100.0000 mg | ORAL_TABLET | Freq: Every day | ORAL | Status: DC

## 2012-12-27 MED ORDER — METOPROLOL SUCCINATE ER 50 MG PO TB24: ORAL_TABLET | ORAL | Status: DC

## 2012-12-27 NOTE — Patient Instructions (Signed)
Decrease  Metoprolol succ 1/2 tablet daily   Increase Losartan 100 mg by mouth daily. The tablet that you have now  Take 50 mg  Twice a day until  You finish with your bottle then start with the 100 mg  Daily.  TODAY WEIGHTS - TAKE  2 of your fluid pills day for 3 days then 1 and 1/2 tablets  A day  To keep your weight 4lbs lighter than todays weight. Labs on Monday--BMP  Drink Powerade instead of Gatorade  Your physician wants you to follow-up in 3 months [name].  You will receive a reminder letter in the mail two months in advance. If you don't receive a letter, please call our office to schedule the follow-up appointment.

## 2012-12-28 DIAGNOSIS — R55 Syncope and collapse: Secondary | ICD-10-CM | POA: Insufficient documentation

## 2012-12-28 DIAGNOSIS — Z23 Encounter for immunization: Secondary | ICD-10-CM | POA: Diagnosis not present

## 2012-12-28 NOTE — Assessment & Plan Note (Signed)
Is being followed by her primary care provider.  She is on Crestor and [employer] along with Niaspan.

## 2012-12-28 NOTE — Assessment & Plan Note (Signed)
I don't think this is angina this is having.  [name] will continue the Imdur dose as is.

## 2012-12-28 NOTE — Assessment & Plan Note (Signed)
She is not having symptoms that are consistent with her angina at this point.  She continues to be on aspirin plus Plavix despite her having a bare-metal stent.  I think for now we'll leave it alone and she is not having questions and concerns with her.   She is on a statin as well as her ARB and beta blocker.  During last visit we increased her Imdur to 60 mg daily.  We will keep that as it is not reduced her beta blocker dose.

## 2012-12-28 NOTE — Assessment & Plan Note (Signed)
For lack of better diagnostic term to consider these episodes as she is telling blank spells as a near syncopal spells since he almost loses consciousness.  They do not seem to be related to a tachyarrhythmia because she is not feeling any rapid heartbeats associated with it, and she is quite aware of her heart going fast.  I'm almost concerned that she could have some pauses they were not capturing.  I talked her about the potential of using a monitor or event loop recorder.  If significant worse that maybe we have to go to work.    For now him and he was actually back to back off a bit of a beta blocker back to 25 mg metoprolol and use ARB at a higher dose, because she thinks that this all occurred when she was switched from Benicar to losartan. If his symptoms do not get any better, I think we'll have to do a monitor in order to see the capture any potential pauses.

## 2012-12-28 NOTE — Progress Notes (Signed)
Patient ID: [name], female   DOB: [birth year], 72 y.o.   MRN: [id number]  PCP: Glo Herring., MD  Nephrologist: [name]  Clinic Note: Chief Complaint  Patient presents with  . 3 month visit     chest pain- 2 days ago had to spells "weak , lightheaded,shaky, just do not fell right-it starts in center of her chest, edema in legs, feet, still  going to wound center   HPI: [name] is a 72 y.o. female with a PMH below who presents today for evaluation of discomfort in her chest.  She is a long-term patient of Dr. Aldona Bar who I saw for the first time back in February of this year.  At that time I made a few adjustments to her medications that seem to be relatively minor she was restarted ARB but due to insurance issues was switched to losartan.  Last our last visit, I adjust her medications increasing her metoprolol dose to 50 mg daily to try help treat her palpitations.  I also increased her Bumex dose for couple days as he couldn't keep that down and then talked about using a sliding scale Bumex.  Interval History: She now presents for three-month followup from that time frame.  She says that her palpitations are occurring still but less frequently.  Her biggest complaint according to her husband is a swelling in the legs despite this she is remained stable in her weight from July, and a Promus today using a sliding scale Bumex dose.  According to her what bothering her more is that she still having "spells" about the 1-2 times a week where she'll have a sensation of "almost going out, but not fully. "She says they often occur shortly after she feel whatever flip-flop episodes.  She doesn't say her heart races she is not feeling any more palpitations and feels that way this itches is that she is almost like having a seizure at that moment.  She feels shaky, weak, and is often very confused.  She never actually loses consciousness because she is totally aware of these episodes occurring.   The last one she had was very bad was last weekend.  They don't happen frequently enough where she days she may develop finding on a monitor her.  She doesn't note that her chest hurts, it is simply "an empty feeling."  She has been very much out of sorts when she got back on her Cerefolin, which he is taking for her memory issues and dementia.  The CSF him a "getting her back up to her baseline.  Edema really has not gotten much worse, her husband and has. She denies any PND, orthopnea associated with it. She denies any true anginal chest pain/ pressure or dyspnea with rest or exertion.  She denies any TIA or amaurosis fugax symptoms.  Besides that his spells, she denies any sensation of lightheadedness, dizziness or syncope/near syncope. She is a little wobbly with her gait do to pain in her left knee from her injury. She denies any melena, hematochezia or hematuria.  No nosebleeds no claudication.  Past Medical History  Diagnosis Date  . CAD S/P percutaneous coronary angioplasty 2007; March 2011    [facility] 3.0 mm 20 mm postdilated 3.6 mm in early mid LAD; status post ISR Cutting Balloon PTCA and March 11 along with PCI of distal mid lesion with a 3.0 mm 12 mm MultiLink vision BMS; the proximal stent causes jailing of SP1 and  SP2 with ostial 70-80% lesions  . Diabetes mellitus type 2 with neurological manifestations   . Peripheral neuropathy   . Hypertension, essential, benign   . Dyslipidemia, goal LDL below 70   . Cancer      unspecified  . Arthritis      osteoarthritis  . Anxiety   . History of hematuria     Followed by Dr. Gaynelle Arabian    Prior Cardiac Evaluation and Past Surgical History: Past Surgical History  Procedure Laterality Date  . Abdominal hysterectomy    . Bladder suspension    . Appendectomy    . Cholecystectomy    . Nm myoview ltd  11/16/2011    5 beats a PVC during recovery.  No skin or infarction.  Reached 5 METs, EKG negative for ischemia   . Doppler  echocardiography  12/31/2009    EF 55-60%, normal LV size and function, Grade 1 diastolic dysfunction with mild TR  . Carotid doppler  06/24/2009    40-59% R ICA stenosis. No significant ICA stenosis noted  . Cardiac catheterization  02/24/2006    85% stenosis in the proximal portion of LAD-arrangements made for PCI on 02/25/2006  . Cardiac catheterization  02/25/2006    80% LAD lesion stented with a 3x74mm [facility] stent resulting in reduction of 80% lesion to 0% residual  . Cardiac catheterization  04/26/2006    Medical management  . Cardiac catheterization  06/24/2009    60-70% re-stenosis in the proximal LAD. A 3.25x15 cutting balloon, 3 inflations - 14atm-38sec, 13atm-39sec, and 12atm-40sec reduced to less than 10%. 60% stenosis of the mid/distal LAD stented with a 3x47mm Multilink stent.  . Cardiac catheterization  07/09/2009    Medical management    Allergies  Allergen Reactions  . Lipitor [Atorvastatin]   . Nsaids     swelling   Medications are reviewed in Epic:  Current Outpatient Prescriptions  Medication Sig Dispense Refill  . aspirin EC 325 MG tablet Take 325 mg by mouth every evening.      . bethanechol (URECHOLINE) 25 MG tablet Take 25 mg by mouth daily at 12 noon. 1/2 tablet every night, and 1 tablet in the morning      . bumetanide (BUMEX) 1 MG tablet Take 1 to 2 tablet a day or as directed  180 tablet  3  . Calcium Carb-Cholecalciferol (CALCIUM-VITAMIN D3) 600-400 MG-UNIT CAPS Take by mouth 2 (two) times daily.      . cholecalciferol (VITAMIN D) 1000 UNITS tablet Take 2,000 Units by mouth daily.      . clopidogrel (PLAVIX) 75 MG tablet Take 75 mg by mouth every morning.       . COD LIVER OIL PO Take 1 tablet by mouth daily.      . diazepam (VALIUM) 5 MG tablet Take 2.5-5 mg by mouth 2 (two) times daily. 2.5mg  every morning , and 5mg  at night before bedtime      . divalproex (DEPAKOTE) 500 MG DR tablet Take 500 mg by mouth every morning.       . estrogens, conjugated,  (PREMARIN) 0.3 MG tablet Take 0.3 mg by mouth every evening. Take daily for 21 days then do not take for 7 days.      . Flaxseed, Linseed, (FLAX SEED OIL PO) Take 1 tablet by mouth 2 (two) times daily.       . isosorbide mononitrate (IMDUR) 60 MG 24 hr tablet Take 60 mg by mouth. at noontime      .  KRILL OIL PO Take 1 capsule by mouth every morning.      . metFORMIN (GLUCOPHAGE) 500 MG tablet Take 500 mg by mouth 2 (two) times daily with a meal.      . Methylfol-Methylcob-Acetylcyst (CEREFOLIN NAC) 6-2-600 MG TABS Take 1 tablet by mouth daily      . metoprolol succinate (TOPROL-XL) 50 MG 24 hr tablet take 1/2 tablet by mouth daily  90 tablet  3  . metroNIDAZOLE (METROCREAM) 0.75 % cream Apply topically 2 (two) times daily.      . Multiple Vitamins-Minerals (HAIR/SKIN/NAILS PO) Take 1 capsule by mouth every morning.      Marland Kitchen NIASPAN 500 MG CR tablet TAKE 1 TABLET AT BEDTIME  90 tablet  3  . nitroGLYCERIN (NITROLINGUAL) 0.4 MG/SPRAY spray Place 1 spray under the tongue every 5 (five) minutes as needed for chest pain.  12 g  6  . Omega-3 Fatty Acids (FISH OIL) 1000 MG CAPS Take 1 capsule by mouth 2 (two) times daily.       . pantoprazole (PROTONIX) 40 MG tablet Take 40 mg by mouth 2 (two) times daily.      . rosuvastatin (CRESTOR) 10 MG tablet Take 10 mg by mouth every evening.       . urea (CARMOL) 40 % CREA Apply topically daily.      Marland Kitchen losartan (COZAAR) 50 MG tablet Take 1 tablet (50 mg total) by mouth daily.  90 tablet  3   No current facility-administered medications for this visit.    History   Social History Narrative   She is a married mother of 74, grandmother of 2.  Usually very active and out about the house.  But currently over last 6-8 weeks has been incapacitated.  She never smoked and does not drink alcohol.    ROS: A comprehensive Review of Systems - Negative except Pertinent positives noted above and noncardiac situation below. General ROS: positive for  - A general feeling of  anxiousness She has a significant amount of anxiety and stress going on.  Her husband is very concerned, and she agrees, that she has some baseline dementia.  She has some memory issues and is sometimes perseverating on small things that have long since passed.    PHYSICAL EXAM BP 138/68  Pulse 86  Ht 5' 6.5" (1.689 m)  Wt 178 lb 1.6 oz (80.786 kg)  BMI 28.32 kg/m2 General appearance: alert, cooperative, appears stated age, no distress and less anxious She is well-groomed and well-nourished. Neck: no adenopathy, no carotid bruit and no JVD Lungs: CTA B., normal percussion bilaterally and Nonlabored, good air movement Heart: RRR, S1, S2 normal, no murmur, click, rub or gallop and normal apical impulse Abdomen: Soft mildly tender along the right flank there was a bruise.  Nondistended, normal active bowel sounds.  No organomegaly noted. Extremities: edema 1+ bilateral lower extremity and Left knee has bandages in place also her forearms as well. Pulses: 2+ and symmetric Neurologic: Mental status: Alert, oriented, thought content appropriate, affect: Remains labile Cranial nerves: normal  [id number] today: Yes Rate:82 , Rhythm:  normal sinus rhythm, with ECG  Recent Labs 08/03/12:   Total cholesterol 180, HDL 92, triglycerides 114, LDL 65. --Excellent  ASSESSMENT / PLAN: Relatively stable elderly woman.     Near syncope For lack of better diagnostic term to consider these episodes as she is telling blank spells as a near syncopal spells since he almost loses consciousness.  They do not seem to be  related to a tachyarrhythmia because she is not feeling any rapid heartbeats associated with it, and she is quite aware of her heart going fast.  I'm almost concerned that she could have some pauses they were not capturing.  I talked her about the potential of using a monitor or event loop recorder.  If significant worse that maybe we have to go to work.    For now him and he was actually back  to back off a bit of a beta blocker back to 25 mg metoprolol and use ARB at a higher dose, because she thinks that this all occurred when she was switched from Benicar to losartan. If his symptoms do not get any better, I think we'll have to do a monitor in order to see the capture any potential pauses.  CAD S/P percutaneous coronary angioplasty She is not having symptoms that are consistent with her angina at this point.  She continues to be on aspirin plus Plavix despite her having a bare-metal stent.  I think for now we'll leave it alone and she is not having questions and concerns with her.   She is on a statin as well as her ARB and beta blocker.  During last visit we increased her Imdur to 60 mg daily.  We will keep that as it is not reduced her beta blocker dose.    Chest pain at rest I don't think this is angina this is having.  Jamey Reas will continue the Imdur dose as is.  Hypertension, essential, benign Relatively well-controlled.  I think she should be fine without significant flip-flop of reducing the beta blocker dose and increasing the Cozaar dose to 100 mg as it is now listed.   Dyslipidemia, goal LDL below 70 Is being followed by her primary care provider.  She is on Crestor and [employer] along with Niaspan.  Edema of both legs To my examination her edema is really not much at all worse than it was last on Sark, in fact it looks to be much better.  Since she has no PND orthopnea, I can simply see no reason for her to have edema from a cardiac standpoint.  She probably indeed does have some venous insufficiency for which I would recommend wearing compression stockings.  She is artery wearing support hose.  Since she is concerned about it I have given her the following instructions: TAKE  2 of your fluid pills day for 3 days then 1 and 1/2 tablets  A day  To keep your weight 4lbs lighter than todays weight. Labs on Monday--BMP   Total time with patient: 73minutes Orders Placed This  Encounter  Procedures  . Basic metabolic panel  . EKG 12-Lead    Scheduling Instructions:     [facility] [place]    Order Specific Question:  Where should this test be performed    Answer:  OTHER   Followup: 3 months   DAVID W. Ellyn Hack, M.D., M.S. [facility] [address] [place], [place]  [address]  [phone] Pager # [phone]

## 2012-12-28 NOTE — Assessment & Plan Note (Signed)
Relatively well-controlled.  I think she should be fine without significant flip-flop of reducing the beta blocker dose and increasing the Cozaar dose to 100 mg as it is now listed.

## 2012-12-28 NOTE — Assessment & Plan Note (Signed)
To my examination her edema is really not much at all worse than it was last on Sark, in fact it looks to be much better.  Since she has no PND orthopnea, I can simply see no reason for her to have edema from a cardiac standpoint.  She probably indeed does have some venous insufficiency for which I would recommend wearing compression stockings.  She is artery wearing support hose.  Since she is concerned about it I have given her the following instructions: TAKE  2 of your fluid pills day for 3 days then 1 and 1/2 tablets  A day  To keep your weight 4lbs lighter than todays weight. Labs on Monday--BMP

## 2012-12-29 ENCOUNTER — Other Ambulatory Visit: Payer: Self-pay | Admitting: Cardiovascular Disease

## 2012-12-29 NOTE — Telephone Encounter (Signed)
Rx was sent to pharmacy electronically. 

## 2013-01-03 ENCOUNTER — Encounter (HOSPITAL_BASED_OUTPATIENT_CLINIC_OR_DEPARTMENT_OTHER): Payer: Medicare Other | Attending: General Surgery

## 2013-01-03 ENCOUNTER — Telehealth: Payer: Self-pay | Admitting: Cardiology

## 2013-01-03 DIAGNOSIS — L97809 Non-pressure chronic ulcer of other part of unspecified lower leg with unspecified severity: Secondary | ICD-10-CM | POA: Insufficient documentation

## 2013-01-03 DIAGNOSIS — R079 Chest pain, unspecified: Secondary | ICD-10-CM | POA: Diagnosis not present

## 2013-01-03 DIAGNOSIS — Z79899 Other long term (current) drug therapy: Secondary | ICD-10-CM | POA: Diagnosis not present

## 2013-01-03 DIAGNOSIS — I2510 Atherosclerotic heart disease of native coronary artery without angina pectoris: Secondary | ICD-10-CM | POA: Diagnosis not present

## 2013-01-03 DIAGNOSIS — Z9861 Coronary angioplasty status: Secondary | ICD-10-CM | POA: Diagnosis not present

## 2013-01-03 NOTE — Telephone Encounter (Signed)
Going to the wound center today at 3:30-wants to know if she can have her lab work over at [facility]? Her lab sheet says [facility].please call asap.

## 2013-01-03 NOTE — Telephone Encounter (Signed)
Patient states she will go to [facility]

## 2013-01-04 DIAGNOSIS — L719 Rosacea, unspecified: Secondary | ICD-10-CM | POA: Diagnosis not present

## 2013-01-04 DIAGNOSIS — Z85828 Personal history of other malignant neoplasm of skin: Secondary | ICD-10-CM | POA: Diagnosis not present

## 2013-01-04 DIAGNOSIS — Z85820 Personal history of malignant melanoma of skin: Secondary | ICD-10-CM | POA: Diagnosis not present

## 2013-01-04 DIAGNOSIS — L821 Other seborrheic keratosis: Secondary | ICD-10-CM | POA: Diagnosis not present

## 2013-01-04 DIAGNOSIS — L570 Actinic keratosis: Secondary | ICD-10-CM | POA: Diagnosis not present

## 2013-01-04 LAB — BASIC METABOLIC PANEL
BUN: 14 mg/dL (ref 6–23)
CO2: 32 mEq/L (ref 19–32)
Calcium: 9.4 mg/dL (ref 8.4–10.5)
Chloride: 90 mEq/L — ABNORMAL LOW (ref 96–112)
Creat: 0.92 mg/dL (ref 0.50–1.10)
Glucose, Bld: 90 mg/dL (ref 70–99)
Potassium: 4 mEq/L (ref 3.5–5.3)
Sodium: 128 mEq/L — ABNORMAL LOW (ref 135–145)

## 2013-01-05 ENCOUNTER — Telehealth: Payer: Self-pay | Admitting: *Deleted

## 2013-01-05 ENCOUNTER — Encounter: Payer: Self-pay | Admitting: *Deleted

## 2013-01-05 DIAGNOSIS — E871 Hypo-osmolality and hyponatremia: Secondary | ICD-10-CM

## 2013-01-05 NOTE — Telephone Encounter (Signed)
Message copied by Raiford Simmonds on Fri Jan 05, 2013  4:17 PM ------      Message from: Leonie Man      Created: Thu Jan 04, 2013  9:30 AM       Sodium is a bit low - have her hold 1 day of Lasix.            Recheck next week.            Leonie Man, MD       ------

## 2013-01-05 NOTE — Telephone Encounter (Signed)
SPOKE TO  PATIENT INFORMED HER THAT DR HARDING WANTED HER HOLD lasix (FUROSEMIDE) FOR ONE DAY. SHE STATES SHE WILL START TOMORROW.  PER ORDER, HE WOULD LIKE HER TO HAVE LAB WORK (BMP) NEXT WEEK-Thursday. SHE REQUEST A COPY OF LAB TO BE SENT - MAILED AND DONE  VERBALIZED UNDERSTANDING

## 2013-01-05 NOTE — Telephone Encounter (Signed)
Labs order ,Done per order

## 2013-01-09 ENCOUNTER — Encounter (HOSPITAL_BASED_OUTPATIENT_CLINIC_OR_DEPARTMENT_OTHER): Payer: Medicare Other

## 2013-01-09 DIAGNOSIS — E871 Hypo-osmolality and hyponatremia: Secondary | ICD-10-CM | POA: Diagnosis not present

## 2013-01-09 LAB — BASIC METABOLIC PANEL
BUN: 13 mg/dL (ref 6–23)
CO2: 31 mEq/L (ref 19–32)
Calcium: 10.2 mg/dL (ref 8.4–10.5)
Chloride: 94 mEq/L — ABNORMAL LOW (ref 96–112)
Creat: 0.94 mg/dL (ref 0.50–1.10)
Glucose, Bld: 101 mg/dL — ABNORMAL HIGH (ref 70–99)
Potassium: 5.2 mEq/L (ref 3.5–5.3)
Sodium: 133 mEq/L — ABNORMAL LOW (ref 135–145)

## 2013-01-15 ENCOUNTER — Encounter: Payer: Self-pay | Admitting: Cardiology

## 2013-01-17 ENCOUNTER — Telehealth: Payer: Self-pay | Admitting: Cardiology

## 2013-01-17 ENCOUNTER — Encounter: Payer: Self-pay | Admitting: *Deleted

## 2013-01-17 NOTE — Telephone Encounter (Signed)
Spoke to patient. Result given . Verbalized understanding Mailed letter with results per patient request.

## 2013-01-17 NOTE — Telephone Encounter (Signed)
Message copied by Raiford Simmonds on Wed Jan 17, 2013  3:04 PM ------      Message from: Leonie Man      Created: Tue Jan 09, 2013  9:37 PM       Sodium level looks better.        Can return to normal regimen.            Leonie Man, MD       ------

## 2013-01-17 NOTE — Telephone Encounter (Signed)
Message sent to Trixie Dredge, RN

## 2013-01-17 NOTE — Telephone Encounter (Signed)
Need results of labwork from 14th  Have not heard from anyone  Please call

## 2013-01-24 DIAGNOSIS — L97809 Non-pressure chronic ulcer of other part of unspecified lower leg with unspecified severity: Secondary | ICD-10-CM | POA: Diagnosis not present

## 2013-02-05 DIAGNOSIS — IMO0001 Reserved for inherently not codable concepts without codable children: Secondary | ICD-10-CM | POA: Diagnosis not present

## 2013-02-05 DIAGNOSIS — I10 Essential (primary) hypertension: Secondary | ICD-10-CM | POA: Diagnosis not present

## 2013-02-05 DIAGNOSIS — G609 Hereditary and idiopathic neuropathy, unspecified: Secondary | ICD-10-CM | POA: Diagnosis not present

## 2013-02-05 DIAGNOSIS — E7800 Pure hypercholesterolemia, unspecified: Secondary | ICD-10-CM | POA: Diagnosis not present

## 2013-02-20 DIAGNOSIS — H524 Presbyopia: Secondary | ICD-10-CM | POA: Diagnosis not present

## 2013-02-20 DIAGNOSIS — E119 Type 2 diabetes mellitus without complications: Secondary | ICD-10-CM | POA: Diagnosis not present

## 2013-02-20 DIAGNOSIS — H26499 Other secondary cataract, unspecified eye: Secondary | ICD-10-CM | POA: Diagnosis not present

## 2013-02-20 DIAGNOSIS — Z961 Presence of intraocular lens: Secondary | ICD-10-CM | POA: Diagnosis not present

## 2013-03-08 ENCOUNTER — Encounter: Payer: Self-pay | Admitting: Nurse Practitioner

## 2013-03-08 ENCOUNTER — Ambulatory Visit (INDEPENDENT_AMBULATORY_CARE_PROVIDER_SITE_OTHER): Payer: Medicare Other | Admitting: Nurse Practitioner

## 2013-03-08 VITALS — BP 150/93 | HR 67 | Temp 97.2°F | Ht 66.0 in | Wt 176.0 lb

## 2013-03-08 DIAGNOSIS — G319 Degenerative disease of nervous system, unspecified: Secondary | ICD-10-CM | POA: Diagnosis not present

## 2013-03-08 NOTE — Progress Notes (Signed)
PATIENT: [name] DOB: [birth year]   REASON FOR VISIT: follow up for MCI vs. 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   Update 12/06/2012 (PS): [name] returns for f/u after last vist 06/07/2012.  She has stopped cerefolin nac as insurance is not paying for it.  Memory loss seems stable but she feels subjectively she was doing better on Cerefolin nac. She recently fell of a tractor in her yard and hurt her foot but is improving. She continues to have poor recent memory but is able to recall later.  She decided not to do expedition 3 study upon advise from [name] her cardiologist.   Update 03/08/13 ([place]):  [name] returns to office for follow up for memory.  She reports that she has some bad days and good days.  Her husband agrees that her memory deficit is short-term memory, and that it fluctuates.  He states that she is very aware of the days where she is not feeling her best; those days she stays at home and keeps to herself.  She states that she has times when she feels "like she is going to leave her body."  She denies the feeling as a feeling of panic or a feeling of something bad is about to happen.  She has felt very "swimmy-headed" for the last few months.  She denies the feeling of the room spinning or that she is in motion.  ROS:  14 system review of systems is positive for short term memory loss.  ALLERGIES: Allergies  Allergen Reactions  . Lipitor [Atorvastatin]   . Nsaids     swelling    HOME MEDICATIONS: Outpatient Prescriptions Prior to Visit  Medication Sig Dispense Refill  . aspirin EC 325 MG tablet Take 325 mg by mouth every evening.      . bethanechol (URECHOLINE) 25 MG tablet Take 25 mg by mouth daily.       . Calcium Carb-Cholecalciferol (CALCIUM-VITAMIN D3) 600-400 MG-UNIT CAPS Take by mouth 2 (two) times daily.      . cholecalciferol (VITAMIN D) 1000 UNITS tablet  Take 2,000 Units by mouth daily.      . clopidogrel (PLAVIX) 75 MG tablet TAKE 1 TABLET DAILY  90 tablet  3  . COD LIVER OIL PO Take 1 tablet by mouth daily.      . diazepam (VALIUM) 5 MG tablet Take 2.5-5 mg by mouth 2 (two) times daily. Taking 2.5 mg in the morning and 1- 5mg  at bedtime      . divalproex (DEPAKOTE) 500 MG DR tablet Take 500 mg by mouth every morning.       . estrogens, conjugated, (PREMARIN) 0.3 MG tablet Take 0.3 mg by mouth every evening.       . isosorbide mononitrate (IMDUR) 60 MG 24 hr tablet Take 60 mg by mouth. at noontime      . KRILL OIL PO Take 1 capsule by mouth every morning.      . metFORMIN (GLUCOPHAGE) 500 MG tablet Take 500 mg by mouth 2 (two) times daily with a meal.      . Methylfol-Methylcob-Acetylcyst (CEREFOLIN NAC) 6-2-600 MG TABS Take 1 tablet by mouth daily      . metoprolol succinate (TOPROL-XL) 50 MG 24 hr tablet take 1/2 tablet by mouth daily  90 tablet  3  . metroNIDAZOLE (METROCREAM) 0.75 % cream Apply topically 2 (two) times daily.      Marland Kitchen  Multiple Vitamins-Minerals (HAIR/SKIN/NAILS PO) Take 1 capsule by mouth every morning.      Marland Kitchen NIASPAN 500 MG CR tablet TAKE 1 TABLET AT BEDTIME  90 tablet  3  . nitroGLYCERIN (NITROLINGUAL) 0.4 MG/SPRAY spray Place 1 spray under the tongue every 5 (five) minutes as needed for chest pain.  12 g  6  . Omega-3 Fatty Acids (FISH OIL) 1000 MG CAPS Take 1 capsule by mouth 2 (two) times daily.       . pantoprazole (PROTONIX) 40 MG tablet Take 40 mg by mouth daily.       . rosuvastatin (CRESTOR) 10 MG tablet Take 10 mg by mouth every evening.       . urea (CARMOL) 40 % CREA Apply topically daily. prn      . bumetanide (BUMEX) 1 MG tablet Take 1 to 2 tablet a day or as directed  180 tablet  3  . losartan (COZAAR) 100 MG tablet Take 1 tablet (100 mg total) by mouth daily.  90 tablet  3  . Flaxseed, Linseed, (FLAX SEED OIL PO) Take 1 tablet by mouth 2 (two) times daily.        . bumetanide (BUMEX) 1 MG tablet    Sig:  Take 1 and 1/2 tablet daily  . losartan (COZAAR) 100 MG tablet    Sig: Take 50 mg by mouth daily.    PAST MEDICAL HISTORY: Past Medical History  Diagnosis Date  . CAD S/P percutaneous coronary angioplasty 2007; March 2011    [facility] 3.0 mm 20 mm postdilated 3.6 mm in early mid LAD; status post ISR Cutting Balloon PTCA and March 11 along with PCI of distal mid lesion with a 3.0 mm 12 mm MultiLink vision BMS; the proximal stent causes jailing of SP1 and SP2 with ostial 70-80% lesions  . Diabetes mellitus type 2 with neurological manifestations   . Peripheral neuropathy   . Hypertension, essential, benign   . Dyslipidemia, goal LDL below 70   . Cancer      unspecified  . Arthritis      osteoarthritis  . Anxiety   . History of hematuria     Followed by Dr. Gaynelle Arabian    PAST SURGICAL HISTORY: Past Surgical History  Procedure Laterality Date  . Abdominal hysterectomy    . Bladder suspension    . Appendectomy    . Cholecystectomy    . Nm myoview ltd  11/16/2011    5 beats a PVC during recovery.  No skin or infarction.  Reached 5 METs, EKG negative for ischemia   . Doppler echocardiography  12/31/2009    EF 55-60%, normal LV size and function, Grade 1 diastolic dysfunction with mild TR  . Carotid doppler  06/24/2009    40-59% R ICA stenosis. No significant ICA stenosis noted  . Cardiac catheterization  02/24/2006    85% stenosis in the proximal portion of LAD-arrangements made for PCI on 02/25/2006  . Cardiac catheterization  02/25/2006    80% LAD lesion stented with a 3x71mm Liberte stent resulting in reduction of 80% lesion to 0% residual  . Cardiac catheterization  04/26/2006    Medical management  . Cardiac catheterization  06/24/2009    60-70% re-stenosis in the proximal LAD. A 3.25x15 cutting balloon, 3 inflations - 14atm-38sec, 13atm-39sec, and 12atm-40sec reduced to less than 10%. 60% stenosis of the mid/distal LAD stented with a 3x89mm Multilink stent.  . Cardiac  catheterization  07/09/2009    Medical management  FAMILY HISTORY: Family History  Problem Relation Age of Onset  . Diabetes Mother   . Hypertension Mother   . Kidney disease Mother   . Hypertension Father   . Emphysema Father   . Heart attack Father   . Heart disease Father   . Arthritis Sister   . Diabetes Sister   . Hypertension Sister   . Hypertension Brother   . Hyperlipidemia Brother   . Diabetes Brother   . Stroke Brother   . Diabetes Sister   . Hypertension Sister   . Cancer Sister     Cervical cancer  . Hypertension Brother   . Heart attack Daughter   . Hypertension Daughter   . COPD Daughter   . Cancer Daughter     Breast cancer  . Kidney disease Daughter     Kidney mass    SOCIAL HISTORY: History   Social History  . Marital Status: Married    Spouse Name: N/A    Number of Children: N/A  . Years of Education: N/A   Occupational History  . Not on file.   Social History Main Topics  . Smoking status: Never Smoker   . Smokeless tobacco: Not on file  . Alcohol Use: No  . Drug Use: Not on file  . Sexual Activity: Not on file   Other Topics Concern  . Not on file   Social History Narrative   She is a married mother of 70, grandmother of 2.  Usually very active and out about the house.  But currently over last 6-8 weeks has been incapacitated.  She never smoked and does not drink alcohol.     PHYSICAL EXAM  Filed Vitals:   03/08/13 1426  BP: 150/93  Pulse: 67  Temp: 97.2 F (36.2 C)  Height: 5\' 6"  (1.676 m)  Weight: 176 lb (79.833 kg)   Body mass index is 28.42 kg/(m^2).  General: well developed, well nourished, seated, in no evident distress  Head: head normocephalic and atraumatic. Orohparynx benign  Neck: supple with no carotid or supraclavicular bruits  Cardiovascular: regular rate and rhythm, no murmurs  Musculoskeletal: no deformity  Skin: no rash/petichiae  Vascular: Normal pulses all extremities  Neurologic Exam  Mental  Status: Awake and fully alert. Oriented to place and time. Recent and remote memory intact. Attention span, concentration and fund of knowledge appropriate. Mood and affect appropriate. MMSE 29/30.1 deficit in orientation. AFT 14. (MMSE 29/30, AFT 10, Clock Drawing 4/4 on last visit) Cranial Nerves: Pupils equal, briskly reactive to light. Extraocular movements full without nystagmus. Visual fields full to confrontation. Hearing intact. Facial sensation intact. Face, tongue, palate moves normally and symmetrically.  Motor: Normal bulk and tone. Normal strength in all tested extremity muscles.  Sensory: intact to touch and pinprick and vibratory.  Coordination: Rapid alternating movements normal in all extremities. Finger-to-nose and heel-to-shin performed accurately bilaterally.  Gait and Station: Arises from chair without difficulty. Stance is normal. Gait demonstrates normal stride length and balance . Able to heel, toe and tandem walk without difficulty.  Reflexes: 1+ and symmetric. Toes downgoing.   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   PLAN:  Planned to enter patient in to DART study for pharmacogenetic testing, but tech was unavailable.  They may return to office one day next week to enroll. Continue Cerefolin nac one tablet daily as patient felt better on it.  [facility]. Refer for Neuropsych testing  with Dr. Valentina Shaggy. Follow up in 3-4 months with Dr. Leonie Man.   Orders Placed This Encounter  Procedures  . Ambulatory referral to Neuropsychology   Return in about 3 months (around 06/06/2013).  Philmore Pali, MSN, NP-C 03/08/2013, 3:46 PM [facility] [address], [place], [place] [address] [phone]  Note: This document was prepared with digital dictation and possible smart phrase technology. Any transcriptional errors that result from this process are  unintentional.

## 2013-03-08 NOTE — Patient Instructions (Signed)
PLAN:  Resume Cerefolin nac one tablet daily as patient felt better on it. Refer for Neuropsych testing with Dr. Valentina Shaggy, someone will call to set this up. Follow up in 3-4 months with Dr. Leonie Man.

## 2013-03-12 DIAGNOSIS — N390 Urinary tract infection, site not specified: Secondary | ICD-10-CM | POA: Diagnosis not present

## 2013-03-12 DIAGNOSIS — N3020 Other chronic cystitis without hematuria: Secondary | ICD-10-CM | POA: Diagnosis not present

## 2013-03-19 ENCOUNTER — Encounter: Payer: Self-pay | Admitting: Cardiology

## 2013-03-19 ENCOUNTER — Ambulatory Visit (INDEPENDENT_AMBULATORY_CARE_PROVIDER_SITE_OTHER): Payer: Medicare Other | Admitting: Cardiology

## 2013-03-19 VITALS — BP 128/70 | HR 71 | Ht 63.0 in | Wt 173.3 lb

## 2013-03-19 DIAGNOSIS — I10 Essential (primary) hypertension: Secondary | ICD-10-CM

## 2013-03-19 DIAGNOSIS — Z9861 Coronary angioplasty status: Secondary | ICD-10-CM | POA: Diagnosis not present

## 2013-03-19 DIAGNOSIS — I2510 Atherosclerotic heart disease of native coronary artery without angina pectoris: Secondary | ICD-10-CM | POA: Diagnosis not present

## 2013-03-19 DIAGNOSIS — R55 Syncope and collapse: Secondary | ICD-10-CM

## 2013-03-19 DIAGNOSIS — F419 Anxiety disorder, unspecified: Secondary | ICD-10-CM

## 2013-03-19 DIAGNOSIS — E785 Hyperlipidemia, unspecified: Secondary | ICD-10-CM

## 2013-03-19 DIAGNOSIS — R600 Localized edema: Secondary | ICD-10-CM

## 2013-03-19 DIAGNOSIS — R609 Edema, unspecified: Secondary | ICD-10-CM | POA: Diagnosis not present

## 2013-03-19 DIAGNOSIS — F411 Generalized anxiety disorder: Secondary | ICD-10-CM

## 2013-03-19 NOTE — Patient Instructions (Addendum)
DECREASE COZAAR TO 50 MG DAILY  DECREASE VALIUM DOSAGE TO 1/4 TABLET IN THE MORNING ,CONTINUE PM DOSE  STOP TAKING NIACIN  Your physician wants you to follow-up in 6 month Dr Harding---30 min visit  You will receive a reminder letter in the mail two months in advance. If you don't receive a letter, please call our office to schedule the follow-up appointmentP.

## 2013-03-19 NOTE — Progress Notes (Signed)
Patient ID: [name], female   DOB: Oct 31, 1940, 72 y.o.   MRN: [id number]  PCP: [name]., MD  Nephrologist: [name]  Clinic Note: Chief Complaint  Patient presents with  . 3 month visit    no chest pain -out of the usually, no sob, some edema - but doing put good    HPI: [name] is a 72 y.o. female with a PMH below who presents today for three-month followup.  She is a long-term patient of Dr. Aldona Bar. I last saw her in October, made some adjustments to her medications including going up to 100 mg of Cozaar, and making adjustments to her diuretic at back off on the beta blocker 25 mg.  Interval History: She now presents for three-month followup, and is usually doing fairly well. He does note some mild edema, but it is much improved. General lightheadedness and dizziness and this has really backed off on the Cozaar to 50 mg 100 she's doing better. Unfortunately she has been dealing with what sounds like symptoms of bronchitis significant coughing and wheezing. She denies any fevers or chills.  Does note feeling somewhat dizzy with long bouts of coughing. She has had one episode where she felt happy the standing up quickly and started to fall to, that was more related however to getting her feet thought into sheaths and actual loss of balance.  Edema really has not gotten much worse, her husband and has. She denies any PND, orthopnea associated with it. She denies any true anginal chest pain/ pressure or dyspnea with rest or exertion.  She denies any TIA or amaurosis fugax symptoms.  Besides that his spells, she denies any sensation of lightheadedness, dizziness or syncope/near syncope. She is a little wobbly with her gait do to pain in her left knee from her injury. She denies any melena, hematochezia or hematuria.  No nosebleeds no claudication.  Past Medical History  Diagnosis Date  . CAD S/P percutaneous coronary angioplasty 2007; March 2011    [facility] 3.0 mm 20  mm postdilated 3.6 mm in early mid LAD; status post ISR Cutting Balloon PTCA and March 11 along with PCI of distal mid lesion with a 3.0 mm 12 mm MultiLink vision BMS; the proximal stent causes jailing of SP1 and SP2 with ostial 70-80% lesions  . Diabetes mellitus type 2 with neurological manifestations   . Peripheral neuropathy   . Hypertension, essential, benign   . Dyslipidemia, goal LDL below 70   . Cancer      unspecified  . Arthritis      osteoarthritis  . Anxiety   . History of hematuria     Followed by Dr. Gaynelle Arabian    Prior Cardiac Evaluation and Past Surgical History: Past Surgical History  Procedure Laterality Date  . Abdominal hysterectomy    . Bladder suspension    . Appendectomy    . Cholecystectomy    . Nm myoview ltd  11/16/2011    5 beats a PVC during recovery.  No skin or infarction.  Reached 5 METs, EKG negative for ischemia   . Doppler echocardiography  12/31/2009    EF 55-60%, normal LV size and function, Grade 1 diastolic dysfunction with mild TR  . Carotid doppler  06/24/2009    40-59% R ICA stenosis. No significant ICA stenosis noted  . Cardiac catheterization  02/24/2006    85% stenosis in the proximal portion of LAD-arrangements made for PCI on 02/25/2006  . Cardiac catheterization  02/25/2006    80% LAD lesion stented with a 3x12mm Liberte stent resulting in reduction of 80% lesion to 0% residual  . Cardiac catheterization  04/26/2006    Medical management  . Cardiac catheterization  06/24/2009    60-70% re-stenosis in the proximal LAD. A 3.25x15 cutting balloon, 3 inflations - 14atm-38sec, 13atm-39sec, and 12atm-40sec reduced to less than 10%. 60% stenosis of the mid/distal LAD stented with a 3x46mm Multilink stent.  . Cardiac catheterization  07/09/2009    Medical management    Allergies  Allergen Reactions  . Lipitor [Atorvastatin]   . Nsaids     swelling   Medications are reviewed in Epic: Current Outpatient Prescriptions on File Prior to Visit    Medication Sig Dispense Refill  . aspirin EC 325 MG tablet Take 325 mg by mouth every evening.      . bethanechol (URECHOLINE) 25 MG tablet Take 25 mg by mouth daily.       . bumetanide (BUMEX) 1 MG tablet Take 1 and 1/2 tablet daily      . Calcium Carb-Cholecalciferol (CALCIUM-VITAMIN D3) 600-400 MG-UNIT CAPS Take by mouth 2 (two) times daily.      . cholecalciferol (VITAMIN D) 1000 UNITS tablet Take 2,000 Units by mouth daily.      . clopidogrel (PLAVIX) 75 MG tablet TAKE 1 TABLET DAILY  90 tablet  3  . COD LIVER OIL PO Take 1 tablet by mouth daily.      . diazepam (VALIUM) 5 MG tablet Take 2.5-5 mg by mouth 2 (two) times daily. Taking 2.5 mg in the morning and 1- 5mg  at bedtime      . divalproex (DEPAKOTE) 500 MG DR tablet Take 500 mg by mouth every morning.       . estrogens, conjugated, (PREMARIN) 0.3 MG tablet Take 0.3 mg by mouth every evening.       . Flaxseed, Linseed, (FLAX SEED OIL PO) Take 1 tablet by mouth 2 (two) times daily.       . isosorbide mononitrate (IMDUR) 60 MG 24 hr tablet Take 60 mg by mouth. at noontime      . KRILL OIL PO Take 1 capsule by mouth every morning.      . metFORMIN (GLUCOPHAGE) 500 MG tablet Take 500 mg by mouth 2 (two) times daily with a meal.      . Methylfol-Methylcob-Acetylcyst (CEREFOLIN NAC) 6-2-600 MG TABS Take 1 tablet by mouth daily      . metoprolol succinate (TOPROL-XL) 50 MG 24 hr tablet take 1/2 tablet by mouth daily  90 tablet  3  . metroNIDAZOLE (METROCREAM) 0.75 % cream Apply topically 2 (two) times daily.      . Multiple Vitamins-Minerals (HAIR/SKIN/NAILS PO) Take 1 capsule by mouth every morning.      . nitroGLYCERIN (NITROLINGUAL) 0.4 MG/SPRAY spray Place 1 spray under the tongue every 5 (five) minutes as needed for chest pain.  12 g  6  . Omega-3 Fatty Acids (FISH OIL) 1000 MG CAPS Take 1 capsule by mouth 2 (two) times daily.       . pantoprazole (PROTONIX) 40 MG tablet Take 40 mg by mouth daily.       . rosuvastatin (CRESTOR) 10 MG  tablet Take 10 mg by mouth every evening.       . urea (CARMOL) 40 % CREA Apply topically daily. prn       No current facility-administered medications on file prior to visit.    No current facility-administered medications  for this visit.    History   Social History Narrative   She is a married mother of 29, grandmother of 2.  Usually very active and out about the house.  But currently over last 6-8 weeks has been incapacitated.  She never smoked and does not drink alcohol.    ROS: A comprehensive Review of Systems - Negative except Pertinent positives noted above and noncardiac situation below. General ROS: positive for  - A general feeling of anxiousness; to get him out of bladder spasm noted, she takes medications. She has a significant amount of anxiety and stress going - but this is overall improved from before.  Her husband is very concerned, and she agrees, that she has some baseline dementia.  She has some memory issues and is sometimes perseverating on small things that have long since passed.  She takes a significant medicines early in the morning when he first wakes up in the bathroom, most notably she takes a dose of Valium for bladder spasm early the morning. Then we'll note when she first wakes up feeling very lethargic and dizzy.  PHYSICAL EXAM BP 128/70  Pulse 71  Ht 5\' 3"  (1.6 m)  Wt 173 lb 4.8 oz (78.608 kg)  BMI 30.71 kg/m2 General appearance: alert, cooperative, appears stated age, no distress and less anxious She is well-groomed and well-nourished. Neck: no adenopathy, no carotid bruit and no JVD Lungs: CTA B., normal percussion bilaterally and Nonlabored, good air movement Heart: RRR, S1, S2 normal, no murmur, click, rub or gallop and normal apical impulse Abdomen: Soft mildly tender along the right flank there was a bruise.  Nondistended, normal active bowel sounds.  No organomegaly noted. Extremities: edema 1+ bilateral lower extremity and Left knee has bandages  in place also her forearms as well. Pulses: 2+ and symmetric Neurologic: Mental status: Alert, oriented, thought content appropriate, affect: Remains labile Cranial nerves: normal  [id number] today: Yes Rate:82 , Rhythm:  normal sinus rhythm, with ECG  Recent Labs 12/31/12:   Total cholesterol 148, HDL 74, triglycerides 134, LDL 47. --Excellent  ASSESSMENT / PLAN: Relatively stable elderly woman.   Total time with patient: 77minutes answering multiple questions, and confirming medication reactions. On many occasions having to repeat myself in 2-3 times.  CAD S/P percutaneous coronary angioplasty Stable with no active anginal symptoms. Remains on aspirin reduced 81 mg along with Plavix. Mother can be stopped at any time for any procedures. She is also stable on low-dose beta blocker, statin, ARB. She also takes low dose of Imdur which seems to be helping with possible microvascular ischemia.  Hypertension, essential, benign Well-controlled. I am really happy with her being on lower dose of Cozaar to avoid any syncope type symptoms.  Edema of both legs Again this is other than that mentioned, but I did not see on exam. Went back over again sliding scale Lasix plan. This took several iterations a discussion between her husband and herself.  Near syncope No further episodes. Continue to monitor. I am happy that she is a lower dose of medications.  Although not near syncope, her morning lethargy and unsteadiness is certainly not helped by having a dose of Valium at 3 in the morning. I think that that is a relatively high dose for her to begin with. I recommended reducing the dose by half, and CT can avoid it altogether. Maybe take it before she goes to bed to avoid that spasm in the middle of night.  This issue also to  significant discussion.  Dyslipidemia, goal LDL below 70 At goal. She was asking about the Niaspan, stating that it is quite expensive. I simply think that as well as  her HDL has been pain above target level, she should be fine not being on Niaspan. We'll continue to monitor if it requires restarting.  Anxiety  Again, I think her anxiety and borderline dementia are major players with her symptoms. Her husband is a very very caring and noted caregiver. However he has his own opinions on treatment options and himself requires counseling on that medication regimens. I find myself having to discuss treatment plans with first her, and then have to readdress it with him as well. Overall this takes twice the expected time for medical counseling.   No orders of the defined types were placed in this encounter.   Followup: 3 months   DAVID W. Ellyn Hack, M.D., M.S. [facility] [address] [place], [place]  [address]  [phone] Pager # [phone]

## 2013-03-23 ENCOUNTER — Encounter: Payer: Self-pay | Admitting: Cardiology

## 2013-03-23 MED ORDER — LOSARTAN POTASSIUM 50 MG PO TABS: 50.0000 mg | ORAL_TABLET | Freq: Every day | ORAL | Status: DC

## 2013-03-23 NOTE — Assessment & Plan Note (Signed)
Stable with no active anginal symptoms. Remains on aspirin reduced 81 mg along with Plavix. Mother can be stopped at any time for any procedures. She is also stable on low-dose beta blocker, statin, ARB. She also takes low dose of Imdur which seems to be helping with possible microvascular ischemia.

## 2013-03-23 NOTE — Assessment & Plan Note (Signed)
Well-controlled. I am really happy with her being on lower dose of Cozaar to avoid any syncope type symptoms.

## 2013-03-23 NOTE — Assessment & Plan Note (Signed)
Again this is other than that mentioned, but I did not see on exam. Went back over again sliding scale Lasix plan. This took several iterations a discussion between her husband and herself.

## 2013-03-23 NOTE — Assessment & Plan Note (Signed)
At goal. She was asking about the Niaspan, stating that it is quite expensive. I simply think that as well as her HDL has been pain above target level, she should be fine not being on Niaspan. We'll continue to monitor if it requires restarting.

## 2013-03-23 NOTE — Assessment & Plan Note (Signed)
Again, I think her anxiety and borderline dementia are major players with her symptoms. Her husband is a very very caring and noted caregiver. However he has his own opinions on treatment options and himself requires counseling on that medication regimens. I find myself having to discuss treatment plans with first her, and then have to readdress it with him as well. Overall this takes twice the expected time for medical counseling.

## 2013-03-23 NOTE — Assessment & Plan Note (Addendum)
No further episodes. Continue to monitor. I am happy that she is a lower dose of medications.  Although not near syncope, her morning lethargy and unsteadiness is certainly not helped by having a dose of Valium at 3 in the morning. I think that that is a relatively high dose for her to begin with. I recommended reducing the dose by half, and CT can avoid it altogether. Maybe take it before she goes to bed to avoid that spasm in the middle of night.  This issue also to significant discussion.

## 2013-04-13 IMAGING — MR MR BREAST BILATERAL W WO CONTRAST
6 of 13 series · 24 of 48 positions shown · IV contrast (15cc multihance)
Comparison: Prior mammograms at [REDACTED]

CLINICAL DATA: Strong family history of breast cancer, daughter age
33, sister age 34, heterogeneously dense breast parenchyma.
History of melanoma.

BUN and creatinine were obtained on site at [HOSPITAL] at
[HOSPITAL]..
Results:  BUN 12 mg/dL,  Creatinine 0.9 mg/dL.
BILATERAL BREAST MRI WITH AND WITHOUT CONTRAST
TECHNIQUE: Multiplanar, multisequence MR images of both breasts
were obtained prior to and following the intravenous administration
of 15ml of Multihance.  Three dimensional images were evaluated at
the independent [employer] workstation.

[Series 2: T2 · axial · 3.0mm · 0.47mm/px · z∈[-93,+69]mm · 3 of 55 slices shown]
[im 1/55]
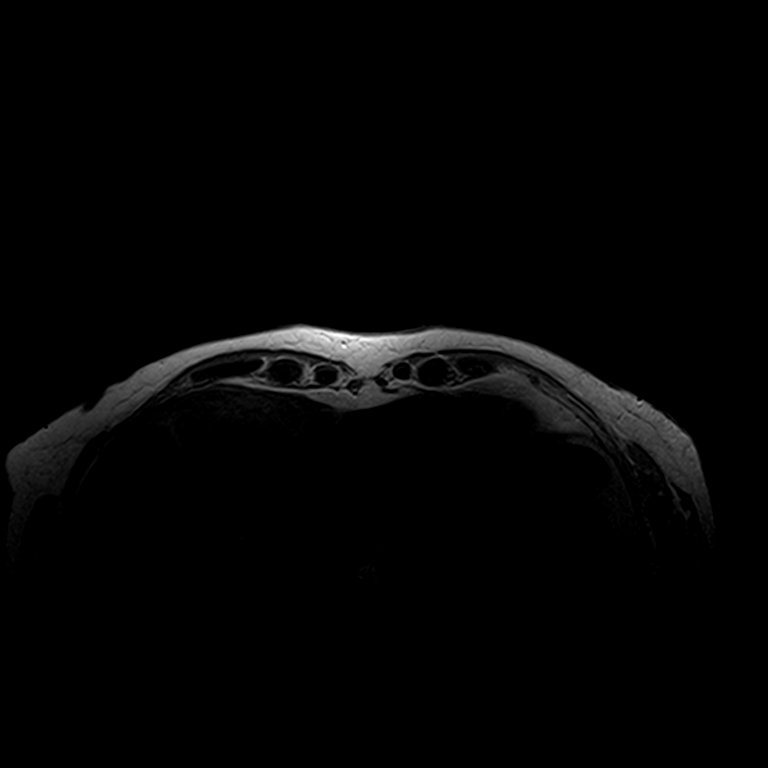
[im 28/55]
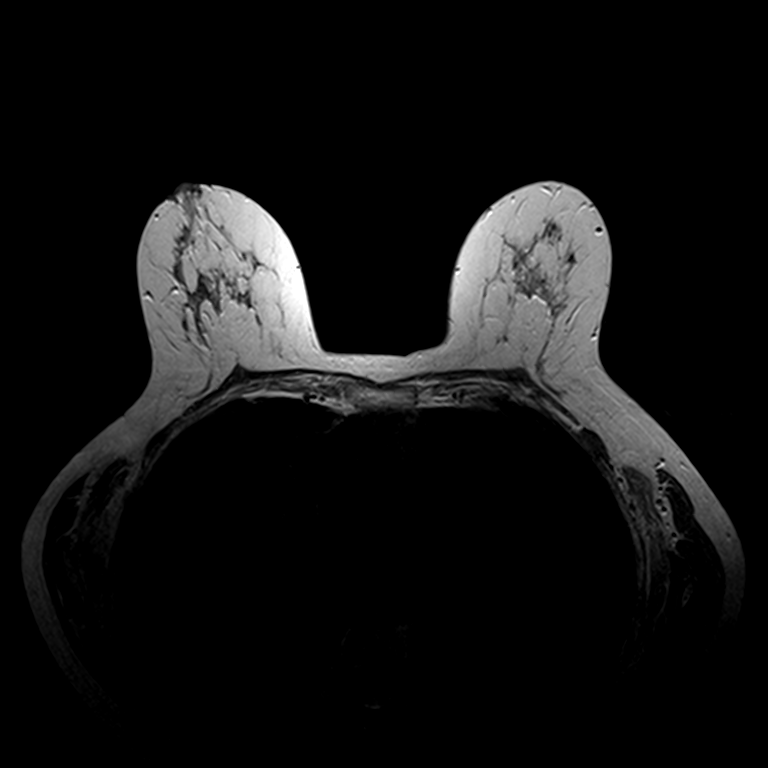
[im 55/55]
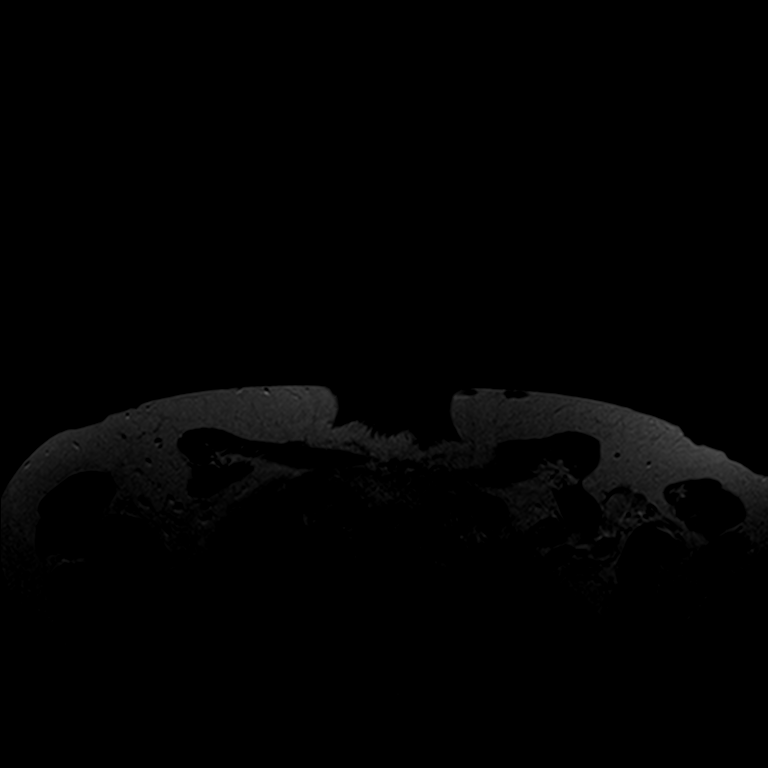

[Series 3: t2_tirm_tra ipat (a-p) · axial · 3.0mm · 0.70mm/px · z∈[-93,+69]mm · 2 of 55 slices shown]
[im 1/55]
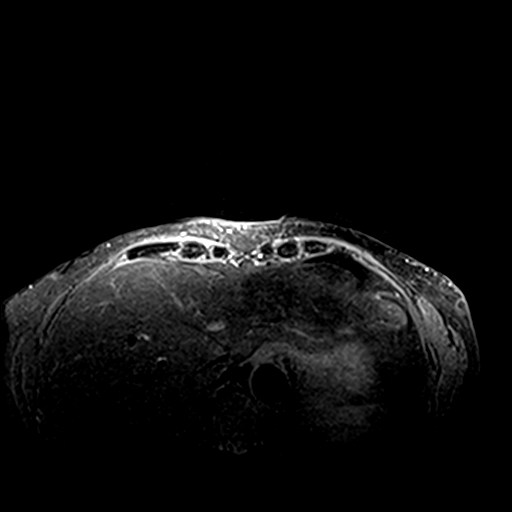
[im 55/55]
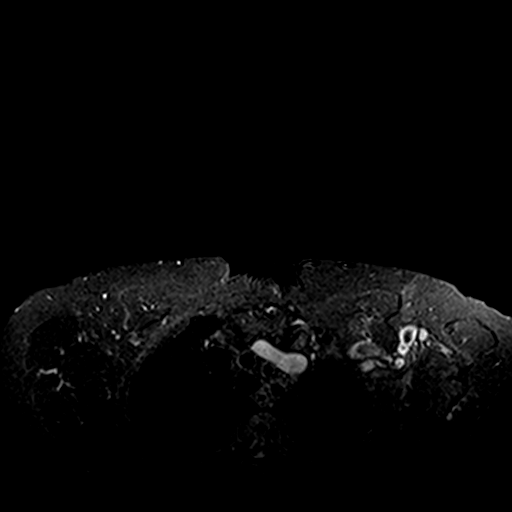

[Series 4: fl3d pre-cm no · axial · non-contrast · 1.2mm · 0.94mm/px · z∈[-97,+74]mm · 5 of 144 slices shown]
[im 1/144]
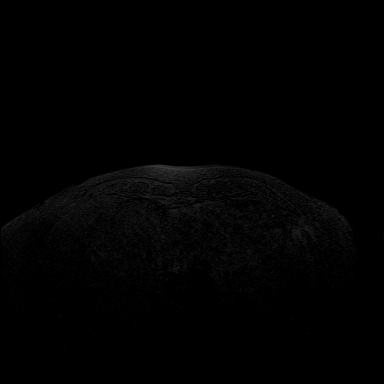
[im 36/144]
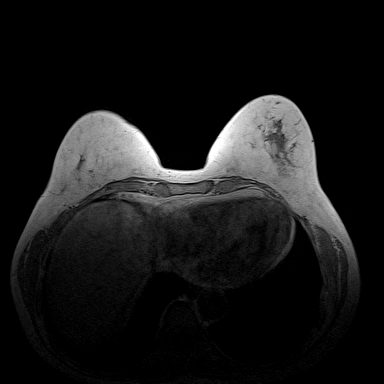
[im 72/144]
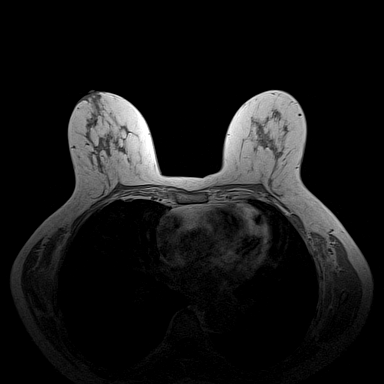
[im 108/144]
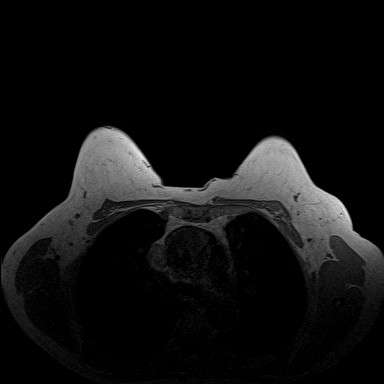
[im 144/144]
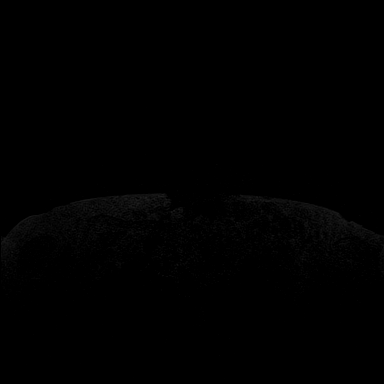

[Series 5: fl3d pre-cm · axial · non-contrast · 1.2mm · 0.94mm/px · z∈[-97,+74]mm · 5 of 144 slices shown]
[im 1/144]
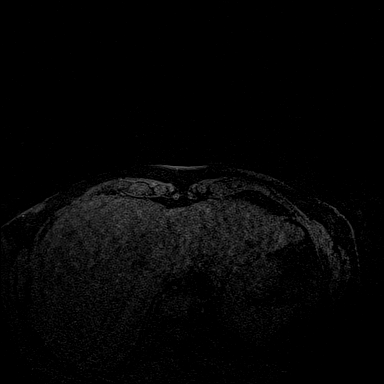
[im 36/144]
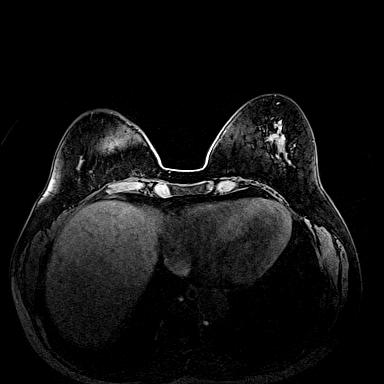
[im 72/144]
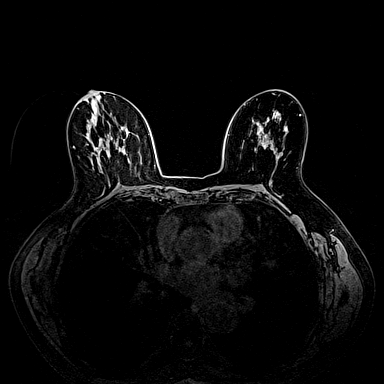
[im 108/144]
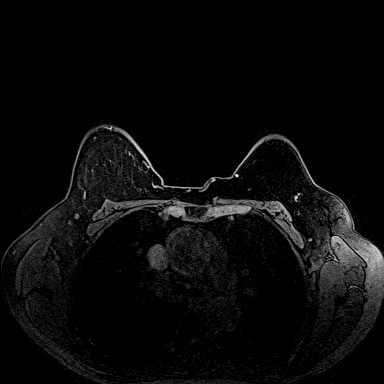
[im 144/144]
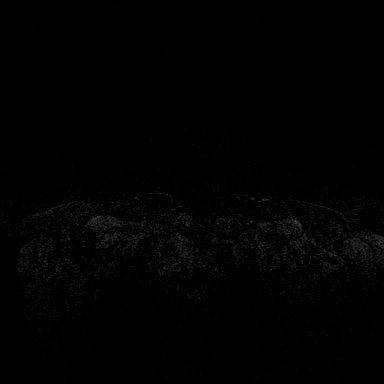

[Series 6: fl3d post immediate · axial · 1.2mm · 0.94mm/px · z∈[-97,+74]mm · 5 of 144 slices shown (1 of 2)]
[im 1/144]
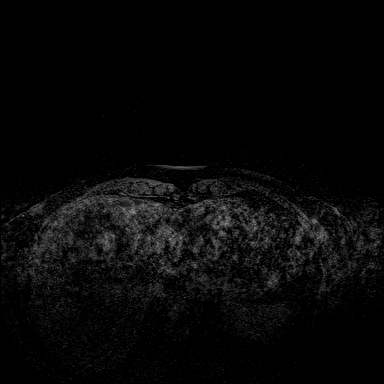
[im 36/144]
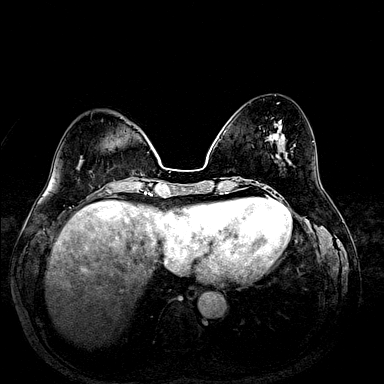
[im 72/144]
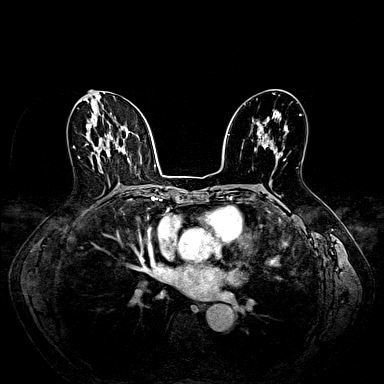
[im 108/144]
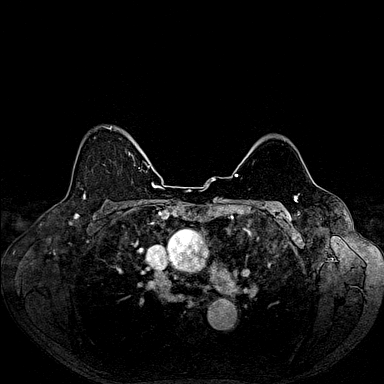
[im 144/144]
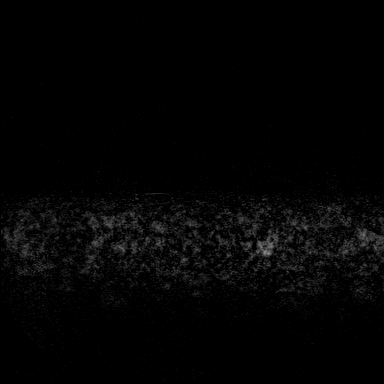

[Series 7: fl3d post immediate · axial · 1.2mm · 0.94mm/px · z∈[-97,+31]mm · 4 of 144 slices shown (2 of 2)]
[im 1/144]
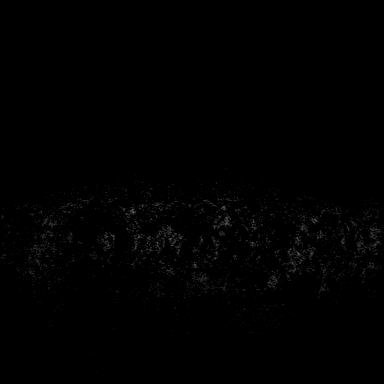
[im 36/144]
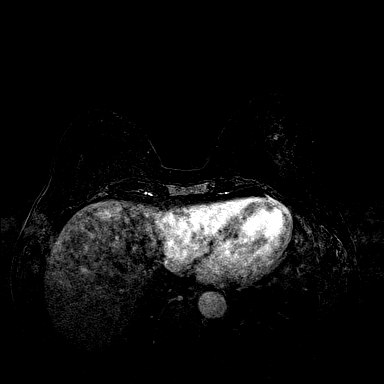
[im 72/144]
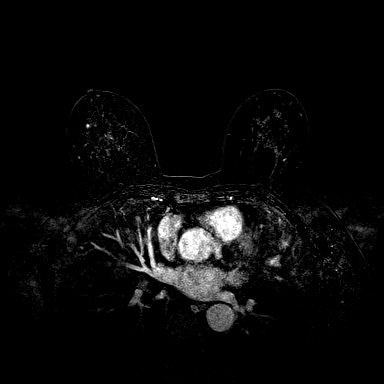
[im 108/144]
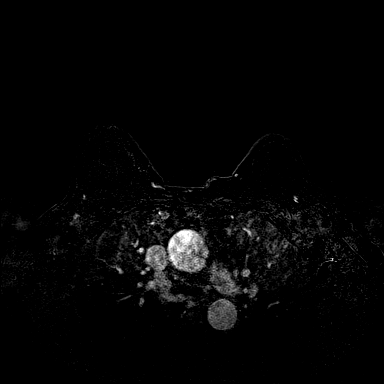

[24 of 48 positions shown; findings below may reference images not displayed]

FINDINGS: No abnormal T2-weighted hyperintensity, lymphadenopathy,
or enhancement is seen on either side.  Background enhancement
pattern is mild.
IMPRESSION: No MRI evidence for malignancy in either breast.

RECOMMENDATION:
Annual screening mammography is recommended September 2012

BI-RADS CATEGORY 1:  Negative.

THREE-DIMENSIONAL MR IMAGE RENDERING ON INDEPENDENT WORKSTATION:

Three-dimensional MR images were rendered by post-processing of the
original MR data on an independent workstation.  The three-
dimensional MR images were interpreted, and findings were reported
in the accompanying complete MRI report for this study.

## 2013-04-21 ENCOUNTER — Other Ambulatory Visit: Payer: Self-pay | Admitting: Cardiology

## 2013-04-23 NOTE — Telephone Encounter (Signed)
Rx was sent to pharmacy electronically. 

## 2013-04-26 DIAGNOSIS — F411 Generalized anxiety disorder: Secondary | ICD-10-CM | POA: Diagnosis not present

## 2013-04-26 DIAGNOSIS — Z79899 Other long term (current) drug therapy: Secondary | ICD-10-CM | POA: Diagnosis not present

## 2013-04-28 ENCOUNTER — Other Ambulatory Visit: Payer: Self-pay | Admitting: Cardiology

## 2013-04-30 NOTE — Telephone Encounter (Signed)
Rx was sent to pharmacy electronically. 

## 2013-05-22 ENCOUNTER — Encounter: Payer: Self-pay | Admitting: Nurse Practitioner

## 2013-06-18 ENCOUNTER — Other Ambulatory Visit: Payer: Self-pay | Admitting: Neurology

## 2013-06-18 NOTE — Telephone Encounter (Signed)
We have been prescribing this drug for several years.

## 2013-07-11 ENCOUNTER — Ambulatory Visit (INDEPENDENT_AMBULATORY_CARE_PROVIDER_SITE_OTHER): Payer: Medicare Other | Admitting: Nurse Practitioner

## 2013-07-11 ENCOUNTER — Encounter: Payer: Self-pay | Admitting: Nurse Practitioner

## 2013-07-11 ENCOUNTER — Encounter (INDEPENDENT_AMBULATORY_CARE_PROVIDER_SITE_OTHER): Payer: Self-pay

## 2013-07-11 VITALS — BP 170/88 | HR 66 | Ht 66.0 in | Wt 173.0 lb

## 2013-07-11 DIAGNOSIS — G319 Degenerative disease of nervous system, unspecified: Secondary | ICD-10-CM

## 2013-07-11 DIAGNOSIS — G2581 Restless legs syndrome: Secondary | ICD-10-CM | POA: Diagnosis not present

## 2013-07-11 MED ORDER — CEREFOLIN NAC 6-2-600 MG PO TABS: 1.0000 | ORAL_TABLET | Freq: Every day | ORAL | Status: DC

## 2013-07-11 MED ORDER — CEREFOLIN NAC 6-2-600 MG PO TABS
1.0000 | ORAL_TABLET | Freq: Every day | ORAL | Status: DC
Start: 2013-07-11 — End: 2013-12-29

## 2013-07-11 MED ORDER — PRAMIPEXOLE DIHYDROCHLORIDE 0.25 MG PO TABS: 0.2500 mg | ORAL_TABLET | Freq: Every day | ORAL | Status: DC

## 2013-07-11 NOTE — Patient Instructions (Signed)
I have sent in a 90 day supply to [facility] for Cerefolin NAC.  They should contact you in the next couple days.  If not, call to question about the order. I am also giving you a printed copy of the prescription, just in case.  I am giving you a prescription for Restless Legs, Called Mirapex (Pramipexole).  Take 1 tablet before bedtime.  No not take Benadryl with this medication, because it may add to the side effects of dizziness or daytime drowsiness.  Follow up in 6 months, sooner as needed.  Pramipexole tablets What is this medicine? PRAMIPEXOLE (pra mi PEX ole) is used to treat symptoms of Parkinson's disease. It is also used to treat Restless Legs Syndrome. This medicine may be used for other purposes; ask your health care provider or pharmacist if you have questions. COMMON BRAND NAME(S): Mirapex What should I tell my health care provider before I take this medicine? They need to know if you have any of these conditions: -dizzy or fainting spells -heart disease -kidney disease -low blood pressure -sleeping problems -an unusual or allergic reaction to pramipexole, other medicines, foods, dyes, or preservatives -pregnant or trying to get pregnant -breast-feeding How should I use this medicine? Take this medicine by mouth with a glass of water. Follow the directions on the prescription label. Take with food. Take your doses at regular intervals. Do not take your medicine more often than directed. Do not stop taking except on the advice of your doctor or health care professional. Talk to your pediatrician regarding the use of this medicine in children. Special care may be needed. Overdosage: If you think you have taken too much of this medicine contact a poison control center or emergency room at once. NOTE: This medicine is only for you. Do not share this medicine with others. What if I miss a dose? If you miss a dose, take it as soon as you can. If it is almost time for  your next dose, take only that dose. Do not take double or extra doses. What may interact with this medicine? -amantadine -cimetidine -diltiazem -medicines for mental problems or psychotic disturbances -medicines for sleep -metoclopramide -quinidine or quinine -ranitidine -triamterene -verapamil This list may not describe all possible interactions. Give your health care provider a list of all the medicines, herbs, non-prescription drugs, or dietary supplements you use. Also tell them if you smoke, drink alcohol, or use illegal drugs. Some items may interact with your medicine. What should I watch for while using this medicine? Visit your doctor or health care professional for regular checks on your progress. It may be several weeks or months before you feel the full effect of this medicine. Continue to take your medicine on a regular schedule. You may get drowsy or dizzy. Do not drive, use machinery, or do anything that needs mental alertness until you know how this drug affects you. Do not stand or sit up quickly, especially if you are an older patient. This reduces the risk of dizzy or fainting spells. If you find that you have sudden feelings of wanting to sleep during normal activities, like cooking, watching television, or while driving or riding in a car, you should contact your health care professional. Your mouth may get dry. Chewing sugarless gum or sucking hard candy, and drinking plenty of water may help. Contact your doctor if the problem does not go away or is severe. There have been reports of increased sexual urges or other strong urges  such as gambling while taking some medicines for Parkinson's disease. If you experience any of these urges while taking this medicine, you should report it to your health care provider as soon as possible. You should check your skin often for changes to moles and new growths while taking this medicine. Call your doctor if you notice any of these  changes. What side effects may I notice from receiving this medicine? Side effects that you should report to your doctor or health care professional as soon as possible: -confusion -double vision or other vision problems -fainting spells -falling asleep during normal activities like driving -hallucination, loss of contact with reality -mental changes -muscle pain or severe muscle weakness -uncontrollable movements of the arms, face, hands, head, mouth, shoulders, or upper body Side effects that usually do not require medical attention (report to your doctor or health care professional if they continue or are bothersome): -constipation -frequent urination -mild weakness -nausea This list may not describe all possible side effects. Call your doctor for medical advice about side effects. You may report side effects to FDA at 1-800-FDA-1088. Where should I keep my medicine? Keep out of the reach of children. Store at room temperature between 15 and 30 degrees C (59 and 86 degrees F). Protect from light. Throw away any unused medicine after the expiration date. NOTE: This sheet is a summary. It may not cover all possible information. If you have questions about this medicine, talk to your doctor, pharmacist, or health care provider.  2014, Elsevier/Gold Standard. (2008-07-02 20:31:51)    Restless Legs Syndrome Restless legs syndrome is a movement disorder. It may also be called a sensori-motor disorder.  CAUSES  No one knows what specifically causes restless legs syndrome, but it tends to run in families. It is also more common in people with low iron, in pregnancy, in people who need dialysis, and those with nerve damage (neuropathy).Some medications may make restless legs syndrome worse.Those medications include drugs to treat high blood pressure, some heart conditions, nausea, colds, allergies, and depression. SYMPTOMS Symptoms include uncomfortable sensations in the legs. These leg  sensations are worse during periods of inactivity or rest. They are also worse while sitting or lying down. Individuals that have the disorder describe sensations in the legs that feel like:  Pulling.  Drawing.  Crawling.  Worming.  Boring.  Tingling.  Pins and needles.  Prickling.  Pain. The sensations are usually accompanied by an overwhelming urge to move the legs. Sudden muscle jerks may also occur. Movement provides temporary relief from the discomfort. In rare cases, the arms may also be affected. Symptoms may interfere with going to sleep (sleep onset insomnia). Restless legs syndrome may also be related to periodic limb movement disorder (PLMD). PLMD is another more common motor disorder. It also causes interrupted sleep. The symptoms from PLMD usually occur most often when you are awake. TREATMENT  Treatment for restless legs syndrome is symptomatic. This means that the symptoms are treated.   Massage and cold compresses may provide temporary relief.  Walk, stretch, or take a cold or hot bath.  Get regular exercise and a good night's sleep.  Avoid caffeine, alcohol, nicotine, and medications that can make it worse.  Do activities that provide mental stimulation like discussions, needlework, and video games. These may be helpful if you are not able to walk or stretch. Some medications are effective in relieving the symptoms. However, many of these medications have side effects. Ask your caregiver about medications that may help  your symptoms. Correcting iron deficiency may improve symptoms for some patients. Document Released: 03/05/2002 Document Revised: 06/07/2011 Document Reviewed: 06/11/2010 [facility] Patient Information 2014 [place].

## 2013-07-11 NOTE — Progress Notes (Signed)
PATIENT: [name] DOB: Jan 07, 1941  REASON FOR VISIT: follow up for MCI vs. 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  Update 12/06/2012 ([place]): [name] returns for f/u after last vist 06/07/2012. She has stopped cerefolin nac as insurance is not paying for it. Memory loss seems stable but she feels subjectively she was doing better on Cerefolin nac. She recently fell of a tractor in her yard and hurt her foot but is improving. She continues to have poor recent memory but is able to recall later. She decided not to do expedition 3 study upon advise from [name] her cardiologist.   Update 03/08/13 ([place]): [name] returns to office for follow up for memory. She reports that she has some bad days and good days. Her husband agrees that her memory deficit is short-term memory, and that it fluctuates. He states that she is very aware of the days where she is not feeling her best; those days she stays at home and keeps to herself. She states that she has times when she feels "like she is going to leave her body." She denies the feeling as a feeling of panic or a feeling of something bad is about to happen. She has felt very "swimmy-headed" for the last few months. She denies the feeling of the room spinning or that she is in motion.   Update 07/11/13 ([place]): [name] returns for follow up for memory problems.  She has been tolerating Cerefolin NAC well, states it gives her energy and her memory is better. MMSE 28/30 today, CDT 4/4, AFT 11, GDS 1.  There is 1 point drop in MMSE since 6 months ago.  She is very bothered by restless legs at night, she states she feels like she must keep moving her legs due to a crawling sensation.  The sensation wakes her up at night also, but she denies "acting-out" dreams.  History is difficult to obtain due to her husband interjecting comments and fighting amongst  themselves.   REVIEW OF SYSTEMS: Full 14 system review of systems performed and notable only for:  Light sensitivity, leg swelling, palpitations, restless legs, Insomnia, frequent waking, joint pain, joint swelling, aching muscles, bruise easily, short term memory loss, dizziness, speech difficulty, tremors, confusion   ALLERGIES: Allergies  Allergen Reactions  . Lipitor [Atorvastatin]   . Nsaids     swelling    HOME MEDICATIONS: Outpatient Prescriptions Prior to Visit  Medication Sig Dispense Refill  . aspirin EC 325 MG tablet Take 325 mg by mouth every evening.      . bethanechol (URECHOLINE) 25 MG tablet Take 25 mg by mouth daily.       . bumetanide (BUMEX) 1 MG tablet Take 1.5 tablets (1.5 mg total) by mouth daily.  45 tablet  6  . Calcium Carb-Cholecalciferol (CALCIUM-VITAMIN D3) 600-400 MG-UNIT CAPS Take by mouth 2 (two) times daily.      . cholecalciferol (VITAMIN D) 1000 UNITS tablet Take 2,000 Units by mouth daily.      . clopidogrel (PLAVIX) 75 MG tablet TAKE 1 TABLET DAILY  90 tablet  3  . COD LIVER OIL PO Take 1 tablet by mouth daily.      . diazepam (VALIUM) 5 MG tablet Take 2.5-5 mg by mouth 2 (two) times daily. Taking 2.5 mg in the morning and 1- 5mg  at bedtime      . divalproex (DEPAKOTE  ER) 500 MG 24 hr tablet TAKE 1 TABLET DAILY  90 tablet  3  . estrogens, conjugated, (PREMARIN) 0.3 MG tablet Take 0.3 mg by mouth every evening.       . Flaxseed, Linseed, (FLAX SEED OIL PO) Take 1 tablet by mouth 2 (two) times daily.       . isosorbide mononitrate (IMDUR) 60 MG 24 hr tablet Take 60 mg by mouth. at noontime      . KRILL OIL PO Take 1 capsule by mouth every morning.      Marland Kitchen losartan (COZAAR) 50 MG tablet Take 1 tablet (50 mg total) by mouth daily.  90 tablet  3  . metFORMIN (GLUCOPHAGE) 500 MG tablet Take 500 mg by mouth 2 (two) times daily with a meal.      . metoprolol succinate (TOPROL-XL) 50 MG 24 hr tablet take 1/2 tablet by mouth daily  90 tablet  3  .  metroNIDAZOLE (METROCREAM) 0.75 % cream Apply topically 2 (two) times daily.      . Multiple Vitamins-Minerals (HAIR/SKIN/NAILS PO) Take 1 capsule by mouth every morning.      . nitroGLYCERIN (NITROLINGUAL) 0.4 MG/SPRAY spray Place 1 spray under the tongue every 5 (five) minutes as needed for chest pain.  12 g  6  . Omega-3 Fatty Acids (FISH OIL) 1000 MG CAPS Take 1 capsule by mouth 2 (two) times daily.       . pantoprazole (PROTONIX) 40 MG tablet Take 1 tablet (40 mg total) by mouth daily.  90 tablet  3  . rosuvastatin (CRESTOR) 10 MG tablet Take 10 mg by mouth every evening.       . urea (CARMOL) 40 % CREA Apply topically daily. prn      . Methylfol-Methylcob-Acetylcyst (CEREFOLIN NAC) 6-2-600 MG TABS Take 1 tablet by mouth daily       No facility-administered medications prior to visit.    PHYSICAL EXAM  Filed Vitals:   07/11/13 1111  BP: 170/88  Pulse: 66  Height: 5\' 6"  (1.676 m)  Weight: 173 lb (78.472 kg)   Body mass index is 27.94 kg/(m^2).  General: well developed, well nourished, seated, in no evident distress  Head: head normocephalic and atraumatic. Orohparynx benign  Neck: supple with no carotid or supraclavicular bruits  Cardiovascular: regular rate and rhythm, no murmurs  Musculoskeletal: no deformity  Skin: no rash/petichiae  Vascular: Normal pulses all extremities   Neurologic Exam  Mental Status: Awake and fully alert. Oriented to place and time. Recent and remote memory intact. Attention span, concentration and fund of knowledge appropriate. Mood and affect appropriate. MMSE 28/30, AFT 11, CDT 4/4, GDS 1.  (MMSE 29/30.1 deficit in orientation. AFT 14.on last visit)  Cranial Nerves: Pupils equal, briskly reactive to light. Extraocular movements full without nystagmus. Visual fields full to confrontation. Hearing intact. Facial sensation intact. Face, tongue, palate moves normally and symmetrically.  Motor: Normal bulk and tone. Normal strength in all tested  extremity muscles. Mild head tremor at rest.  Sensory: intact to touch and pinprick and vibratory.  Coordination: Rapid alternating movements normal in all extremities. Finger-to-nose and heel-to-shin performed accurately bilaterally.  Gait and Station: Arises from chair without difficulty. Stance is normal. Gait demonstrates normal stride length and balance . Able to heel, toe and tandem walk without difficulty.  Reflexes: 1+ and symmetric.  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  Likely restless  leg syndrome.  PLAN:  Continue Cerefolin nac one tablet daily as patient felt better on it. Prescription thru [facility].  Trial of Pramipexole 0.25 mg at bedtime for restless legs syndrome.  Advised not to take Benadryl with this medication and discussed possible side effects.  Discussed non-pharmacological treatments such as regular exercise, stretching leg muscles, and hot bath before bedtime. Follow up in 6 months with Dr. Leonie Man (Medicare).   Meds ordered this encounter  Medications  . Methylfol-Methylcob-Acetylcyst (CEREFOLIN NAC) 6-2-600 MG TABS    Sig: Take 1 capsule by mouth daily. Take 1 tablet by mouth daily    Dispense:  90 each    Refill:  3    Order Specific Question:  Supervising Provider    Answer:  Leonie Man, PRAMOD S [[id number]]  . pramipexole (MIRAPEX) 0.25 MG tablet    Sig: Take 1 tablet (0.25 mg total) by mouth at bedtime.    Dispense:  90 tablet    Refill:  0    Order Specific Question:  Supervising Provider    Answer:  Garvin Fila [[id number]]   Return in about 6 months (around 01/10/2014).  Philmore Pali, MSN, NP-C 07/11/2013, 1:28 PM [facility] [address] [place], [place] [address] [phone]  Note: This document was prepared with digital dictation and possible smart phrase technology. Any transcriptional errors  that result from this process are unintentional.

## 2013-07-18 DIAGNOSIS — D649 Anemia, unspecified: Secondary | ICD-10-CM | POA: Diagnosis not present

## 2013-07-18 DIAGNOSIS — N182 Chronic kidney disease, stage 2 (mild): Secondary | ICD-10-CM | POA: Diagnosis not present

## 2013-07-18 DIAGNOSIS — N2581 Secondary hyperparathyroidism of renal origin: Secondary | ICD-10-CM | POA: Diagnosis not present

## 2013-07-23 NOTE — Telephone Encounter (Signed)
Refill done by Mariann Laster, CMA on 6.11.14.

## 2013-07-26 DIAGNOSIS — N182 Chronic kidney disease, stage 2 (mild): Secondary | ICD-10-CM | POA: Diagnosis not present

## 2013-07-26 DIAGNOSIS — D649 Anemia, unspecified: Secondary | ICD-10-CM | POA: Diagnosis not present

## 2013-07-26 DIAGNOSIS — I10 Essential (primary) hypertension: Secondary | ICD-10-CM | POA: Diagnosis not present

## 2013-07-26 DIAGNOSIS — N2581 Secondary hyperparathyroidism of renal origin: Secondary | ICD-10-CM | POA: Diagnosis not present

## 2013-07-30 ENCOUNTER — Ambulatory Visit (INDEPENDENT_AMBULATORY_CARE_PROVIDER_SITE_OTHER): Payer: Self-pay | Admitting: Ophthalmology

## 2013-08-06 DIAGNOSIS — E7800 Pure hypercholesterolemia, unspecified: Secondary | ICD-10-CM | POA: Diagnosis not present

## 2013-08-06 DIAGNOSIS — G609 Hereditary and idiopathic neuropathy, unspecified: Secondary | ICD-10-CM | POA: Diagnosis not present

## 2013-08-06 DIAGNOSIS — IMO0001 Reserved for inherently not codable concepts without codable children: Secondary | ICD-10-CM | POA: Diagnosis not present

## 2013-08-06 DIAGNOSIS — I10 Essential (primary) hypertension: Secondary | ICD-10-CM | POA: Diagnosis not present

## 2013-09-05 ENCOUNTER — Ambulatory Visit (INDEPENDENT_AMBULATORY_CARE_PROVIDER_SITE_OTHER): Payer: Medicare Other | Admitting: Ophthalmology

## 2013-09-05 DIAGNOSIS — E1139 Type 2 diabetes mellitus with other diabetic ophthalmic complication: Secondary | ICD-10-CM | POA: Diagnosis not present

## 2013-09-05 DIAGNOSIS — E1165 Type 2 diabetes mellitus with hyperglycemia: Secondary | ICD-10-CM | POA: Diagnosis not present

## 2013-09-05 DIAGNOSIS — E11319 Type 2 diabetes mellitus with unspecified diabetic retinopathy without macular edema: Secondary | ICD-10-CM

## 2013-09-05 DIAGNOSIS — H35039 Hypertensive retinopathy, unspecified eye: Secondary | ICD-10-CM | POA: Diagnosis not present

## 2013-09-05 DIAGNOSIS — I10 Essential (primary) hypertension: Secondary | ICD-10-CM | POA: Diagnosis not present

## 2013-09-05 DIAGNOSIS — H43819 Vitreous degeneration, unspecified eye: Secondary | ICD-10-CM

## 2013-09-12 ENCOUNTER — Other Ambulatory Visit: Payer: Self-pay | Admitting: Cardiology

## 2013-09-12 NOTE — Telephone Encounter (Signed)
Rx was sent to pharmacy electronically. 

## 2013-09-18 ENCOUNTER — Ambulatory Visit (INDEPENDENT_AMBULATORY_CARE_PROVIDER_SITE_OTHER): Payer: Medicare Other | Admitting: Cardiology

## 2013-09-18 ENCOUNTER — Encounter: Payer: Self-pay | Admitting: Cardiology

## 2013-09-18 VITALS — BP 130/88 | HR 82 | Ht 66.0 in | Wt 172.8 lb

## 2013-09-18 DIAGNOSIS — I10 Essential (primary) hypertension: Secondary | ICD-10-CM | POA: Diagnosis not present

## 2013-09-18 DIAGNOSIS — I2510 Atherosclerotic heart disease of native coronary artery without angina pectoris: Secondary | ICD-10-CM | POA: Diagnosis not present

## 2013-09-18 DIAGNOSIS — Z9861 Coronary angioplasty status: Secondary | ICD-10-CM | POA: Diagnosis not present

## 2013-09-18 DIAGNOSIS — R609 Edema, unspecified: Secondary | ICD-10-CM | POA: Diagnosis not present

## 2013-09-18 DIAGNOSIS — E785 Hyperlipidemia, unspecified: Secondary | ICD-10-CM

## 2013-09-18 DIAGNOSIS — R55 Syncope and collapse: Secondary | ICD-10-CM

## 2013-09-18 DIAGNOSIS — R600 Localized edema: Secondary | ICD-10-CM

## 2013-09-18 NOTE — Patient Instructions (Addendum)
Your labs look great! No changes have been made in your current therapy.  Your physician wants you to follow-up in 6 months with Dr Hershal Coria min appointment. You will receive a reminder letter in the mail two months in advance. If you don't receive a letter, please call our office to schedule the follow-up appointment.

## 2013-09-20 ENCOUNTER — Other Ambulatory Visit: Payer: Self-pay | Admitting: Nurse Practitioner

## 2013-09-20 MED ORDER — NITROGLYCERIN 0.4 MG/SPRAY TL SOLN: 1.0000 | Status: DC | PRN

## 2013-09-20 NOTE — Assessment & Plan Note (Addendum)
Well-controlled on current plan with the Bumex.

## 2013-09-20 NOTE — Assessment & Plan Note (Signed)
PCI with bare metal stents. Most recent one was in March of 2011. No real anginal symptoms since then. She had a negative Myoview in August of 2013. On aspirin, statin (reduced down to 10 mg), beta blocker and ARB. Also on long-acting nitrate. Simply because she has some fatigue and just after the cath space out her medications will better as the day. I think we also need to back off on some Imdur to her status today. As long as he is not requiring sublingual nitroglycerin, she can continue him off of Imdur.

## 2013-09-20 NOTE — Assessment & Plan Note (Signed)
No further fainting spells.

## 2013-09-20 NOTE — Assessment & Plan Note (Addendum)
Relatively close to goal. Continue with Crestor and [employer]. Monitored by PCP. Level not quite as good as they were last checked. A relatively close.

## 2013-09-20 NOTE — Progress Notes (Signed)
PCP: Glo Herring., MD  Clinic Note: Chief Complaint  Patient presents with  . 6 month  visit    no chest pain --cbut chest tightness,  some sob, some edema, fatigue lethargy after taking morning medication. brought labs   to be reviewed    HPI: [name] is a 73 y.o. female with a Cardiovascular Problem List below who presents today for six-month followup he saw her in December, and she was doing relatively well..  Interval History: Since her last visit, her losartan dose was changed to Cipro 50 twice a day. She is also using her Bumex on sliding scale basis taking one half most days and then 2 tabs if necessary. She still has intermittent episodes of tightness in her chest that she has always with rest not necessarily with exertion. He has intermittent can last minutes to seconds to hours. She also notes occasional palpitations popping but nothing prolonged. No syncopal or reasonable episodes. No TIA or amaurosis fugax symptoms. No melena, hematochezia, hematuria, epistaxis. No PND, orthopnea. She is intermittent edema is well-controlled with her Bumex. She still has mild orthostatic symptoms on occasion. But less of an issue of balance achieved in the past. She still is a somewhat lethargic  in the morning after taking her medications. Overall, she seems to be stable. She was happy to announce her A1c was 5.7. She brought her her recent blood pressures. Limited in the 120/80 range but has been a couple as low as 83/63. She is very that is if the average is 120/76. Heart rates have been mostly in the 80s to 90s.   Past Medical History  Diagnosis Date  . CAD S/P percutaneous coronary angioplasty 2007; March 2011    [facility] 3.0 mm 20 mm postdilated 3.6 mm in early mid LAD; status post ISR Cutting Balloon PTCA and March 11 along with PCI of distal mid lesion with a 3.0 mm 12 mm MultiLink vision BMS; the proximal stent causes jailing of SP1 and SP2 with ostial 70-80% lesions  .  Diabetes mellitus type 2 with neurological manifestations   . Peripheral neuropathy   . Hypertension, essential, benign   . Dyslipidemia, goal LDL below 70   . Cancer      unspecified  . Arthritis      osteoarthritis  . Anxiety   . History of hematuria     Followed by Dr. Gaynelle Arabian    Prior Cardiac Evaluation and Surgical/Procedure History: Procedure Laterality Date  . Nm myoview ltd  11/16/2011    5 beats a PVC during recovery.  No skin or infarction.  Reached 5 METs, EKG negative for ischemia   . Doppler echocardiography  12/31/2009    EF 55-60%, normal LV size and function, Grade 1 diastolic dysfunction with mild TR  . Carotid doppler  06/24/2009    40-59% R ICA stenosis. No significant ICA stenosis noted  . Cardiac catheterization  02/24/2006    85% stenosis in the proximal portion of LAD-arrangements made for PCI on 02/25/2006  . Cardiac catheterization  02/25/2006    80% LAD lesion stented with a 3x15mm Liberte stent resulting in reduction of 80% lesion to 0% residual  . Cardiac catheterization  04/26/2006    Medical management  . Cardiac catheterization  06/24/2009    60-70% re-stenosis in the proximal LAD. A 3.25x15 cutting balloon, 3 inflations - 14atm-38sec, 13atm-39sec, and 12atm-40sec reduced to less than 10%. 60% stenosis of the mid/distal LAD stented with a 3x32mm Multilink stent.  Marland Kitchen  Cardiac catheterization  07/09/2009    Medical management   MEDICATIONS AND ALLERGIES REVIEWED IN EPIC No Change in Social and Family History  ROS: A comprehensive Review of Systems - Negative except Symptoms in history of present illness.  PHYSICAL EXAM BP 130/88  Pulse 82  Ht 5\' 6"  (1.676 m)  Wt 172 lb 12.8 oz (78.382 kg)  BMI 27.90 kg/m2 Gen: alert, cooperative, appears stated age, no distress and less anxious She is well-groomed and well-nourished.  Neck: no adenopathy, no carotid bruit and no JVD  Lungs: CTA B., normal percussion bilaterally and Nonlabored, good air movement    Heart: RRR, S1, S2 normal, no murmur, click, rub or gallop and normal apical impulse  Abdomen: Soft mildly tender along the right flank there was a bruise. Nondistended, normal active bowel sounds. No organomegaly noted.  Extremities: Trace bilateral lower extremity and Left knee has bandages in place also her forearms as well.  Pulses: 2+ and symmetric  Neurologic: Mental status: Alert, oriented, thought content appropriate, affect: Remains labile  Cranial nerves: normal   Adult ECG Report  Rate: 82 ;  Rhythm: normal sinus rhythm; normal EKG Recent Labs :  May 2015  sodium 134, potassium 4.6, chloride 94, bicarbonate 33, BUN 15, creatinine 1.5, glucose 97, calcium 9.5.  -She was very concerned that she was diagnosed with CKD Stage II  LFTs: Normal.  Total cholesterol 196, triglycerides 119, HDL 84, LDL 88  ASSESSMENT / PLAN: CAD S/P percutaneous coronary angioplasty PCI with bare metal stents. Most recent one was in March of 2011. No real anginal symptoms since then. She had a negative Myoview in August of 2013. On aspirin, statin (reduced down to 10 mg), beta blocker and ARB. Also on long-acting nitrate. Simply because she has some fatigue and just after the cath space out her medications will better as the day. I think we also need to back off on some Imdur to her status today. As long as he is not requiring sublingual nitroglycerin, she can continue him off of Imdur.  Hypertension, essential, benign Much better control. We'll also need to consider permissive hypertension to avoid orthostatic hypotension.  Dyslipidemia, goal LDL below 70 Relatively close to goal. Continue with Crestor and [employer]. Monitored by PCP. Level not quite as good as they were last checked. A relatively close.  Near syncope No further fainting spells.  Edema of both legs Well-controlled on current plan with the Bumex.    Orders Placed This Encounter  Procedures  . EKG 12-Lead   Meds ordered this  encounter  Medications  . losartan (COZAAR) 50 MG tablet    Sig: Take 50 mg by mouth 2 (two) times daily.  . nitroGLYCERIN (NITROLINGUAL) 0.4 MG/SPRAY spray    Sig: Place 1 spray under the tongue every 5 (five) minutes as needed for chest pain.    Dispense:  12 g    Refill:  6    Followup: 6  months  DAVID W. Ellyn Hack, M.D., M.S. Interventional Cardiologist [facility]

## 2013-09-20 NOTE — Assessment & Plan Note (Signed)
Much better control. We'll also need to consider permissive hypertension to avoid orthostatic hypotension.

## 2013-09-26 ENCOUNTER — Other Ambulatory Visit: Payer: Self-pay | Admitting: *Deleted

## 2013-09-26 MED ORDER — BUMETANIDE 1 MG PO TABS: 1.5000 mg | ORAL_TABLET | Freq: Every day | ORAL | Status: DC

## 2013-09-26 NOTE — Telephone Encounter (Signed)
Rx was sent to pharmacy electronically. 

## 2013-10-04 ENCOUNTER — Inpatient Hospital Stay (HOSPITAL_COMMUNITY)
Admission: EM | Admit: 2013-10-04 | Discharge: 2013-10-07 | DRG: 312 | Disposition: A | Discharge status: Home / Self Care | Payer: Medicare Other | Attending: Family Medicine | Admitting: Family Medicine

## 2013-10-04 ENCOUNTER — Encounter (HOSPITAL_COMMUNITY): Payer: Self-pay | Admitting: Emergency Medicine

## 2013-10-04 DIAGNOSIS — Z886 Allergy status to analgesic agent status: Secondary | ICD-10-CM

## 2013-10-04 DIAGNOSIS — Z7982 Long term (current) use of aspirin: Secondary | ICD-10-CM

## 2013-10-04 DIAGNOSIS — E785 Hyperlipidemia, unspecified: Secondary | ICD-10-CM | POA: Diagnosis present

## 2013-10-04 DIAGNOSIS — Z7902 Long term (current) use of antithrombotics/antiplatelets: Secondary | ICD-10-CM | POA: Diagnosis not present

## 2013-10-04 DIAGNOSIS — F419 Anxiety disorder, unspecified: Secondary | ICD-10-CM

## 2013-10-04 DIAGNOSIS — Z85820 Personal history of malignant melanoma of skin: Secondary | ICD-10-CM

## 2013-10-04 DIAGNOSIS — E861 Hypovolemia: Secondary | ICD-10-CM | POA: Diagnosis present

## 2013-10-04 DIAGNOSIS — N189 Chronic kidney disease, unspecified: Secondary | ICD-10-CM | POA: Diagnosis present

## 2013-10-04 DIAGNOSIS — R197 Diarrhea, unspecified: Secondary | ICD-10-CM

## 2013-10-04 DIAGNOSIS — F411 Generalized anxiety disorder: Secondary | ICD-10-CM | POA: Diagnosis present

## 2013-10-04 DIAGNOSIS — I2510 Atherosclerotic heart disease of native coronary artery without angina pectoris: Secondary | ICD-10-CM | POA: Diagnosis present

## 2013-10-04 DIAGNOSIS — Z9089 Acquired absence of other organs: Secondary | ICD-10-CM | POA: Diagnosis not present

## 2013-10-04 DIAGNOSIS — Z836 Family history of other diseases of the respiratory system: Secondary | ICD-10-CM

## 2013-10-04 DIAGNOSIS — I517 Cardiomegaly: Secondary | ICD-10-CM | POA: Diagnosis not present

## 2013-10-04 DIAGNOSIS — F0390 Unspecified dementia without behavioral disturbance: Secondary | ICD-10-CM | POA: Diagnosis present

## 2013-10-04 DIAGNOSIS — R55 Syncope and collapse: Principal | ICD-10-CM

## 2013-10-04 DIAGNOSIS — K219 Gastro-esophageal reflux disease without esophagitis: Secondary | ICD-10-CM

## 2013-10-04 DIAGNOSIS — A088 Other specified intestinal infections: Secondary | ICD-10-CM | POA: Diagnosis present

## 2013-10-04 DIAGNOSIS — Z833 Family history of diabetes mellitus: Secondary | ICD-10-CM | POA: Diagnosis not present

## 2013-10-04 DIAGNOSIS — R112 Nausea with vomiting, unspecified: Secondary | ICD-10-CM

## 2013-10-04 DIAGNOSIS — E1149 Type 2 diabetes mellitus with other diabetic neurological complication: Secondary | ICD-10-CM | POA: Diagnosis present

## 2013-10-04 DIAGNOSIS — I129 Hypertensive chronic kidney disease with stage 1 through stage 4 chronic kidney disease, or unspecified chronic kidney disease: Secondary | ICD-10-CM | POA: Diagnosis present

## 2013-10-04 DIAGNOSIS — I10 Essential (primary) hypertension: Secondary | ICD-10-CM | POA: Diagnosis not present

## 2013-10-04 DIAGNOSIS — Z8249 Family history of ischemic heart disease and other diseases of the circulatory system: Secondary | ICD-10-CM | POA: Diagnosis not present

## 2013-10-04 DIAGNOSIS — Z79899 Other long term (current) drug therapy: Secondary | ICD-10-CM | POA: Diagnosis not present

## 2013-10-04 DIAGNOSIS — E860 Dehydration: Secondary | ICD-10-CM | POA: Diagnosis present

## 2013-10-04 DIAGNOSIS — Z8049 Family history of malignant neoplasm of other genital organs: Secondary | ICD-10-CM

## 2013-10-04 DIAGNOSIS — R404 Transient alteration of awareness: Secondary | ICD-10-CM | POA: Diagnosis not present

## 2013-10-04 DIAGNOSIS — R0789 Other chest pain: Secondary | ICD-10-CM | POA: Diagnosis present

## 2013-10-04 DIAGNOSIS — Z841 Family history of disorders of kidney and ureter: Secondary | ICD-10-CM

## 2013-10-04 DIAGNOSIS — Z803 Family history of malignant neoplasm of breast: Secondary | ICD-10-CM

## 2013-10-04 DIAGNOSIS — R079 Chest pain, unspecified: Secondary | ICD-10-CM | POA: Diagnosis present

## 2013-10-04 DIAGNOSIS — Z8261 Family history of arthritis: Secondary | ICD-10-CM

## 2013-10-04 DIAGNOSIS — R600 Localized edema: Secondary | ICD-10-CM

## 2013-10-04 DIAGNOSIS — Z888 Allergy status to other drugs, medicaments and biological substances status: Secondary | ICD-10-CM

## 2013-10-04 DIAGNOSIS — E1142 Type 2 diabetes mellitus with diabetic polyneuropathy: Secondary | ICD-10-CM | POA: Diagnosis present

## 2013-10-04 DIAGNOSIS — Z8673 Personal history of transient ischemic attack (TIA), and cerebral infarction without residual deficits: Secondary | ICD-10-CM | POA: Diagnosis not present

## 2013-10-04 DIAGNOSIS — Z9861 Coronary angioplasty status: Secondary | ICD-10-CM

## 2013-10-04 DIAGNOSIS — Z823 Family history of stroke: Secondary | ICD-10-CM

## 2013-10-04 HISTORY — DX: Heart failure, unspecified: I50.9

## 2013-10-04 HISTORY — DX: Chronic kidney disease, unspecified: N18.9

## 2013-10-04 HISTORY — DX: Headache: R51

## 2013-10-04 HISTORY — DX: Cerebral infarction, unspecified: I63.9

## 2013-10-04 HISTORY — DX: Unspecified dementia, unspecified severity, without behavioral disturbance, psychotic disturbance, mood disturbance, and anxiety: F03.90

## 2013-10-04 LAB — CBC WITH DIFFERENTIAL/PLATELET
Basophils Absolute: 0 10*3/uL (ref 0.0–0.1)
Basophils Relative: 0 % (ref 0–1)
Eosinophils Absolute: 0 10*3/uL (ref 0.0–0.7)
Eosinophils Relative: 0 % (ref 0–5)
HCT: 39.6 % (ref 36.0–46.0)
Hemoglobin: 13.2 g/dL (ref 12.0–15.0)
Lymphocytes Relative: 3 % — ABNORMAL LOW (ref 12–46)
Lymphs Abs: 0.5 10*3/uL — ABNORMAL LOW (ref 0.7–4.0)
MCH: 28.6 pg (ref 26.0–34.0)
MCHC: 33.3 g/dL (ref 30.0–36.0)
MCV: 85.7 fL (ref 78.0–100.0)
Monocytes Absolute: 1.4 10*3/uL — ABNORMAL HIGH (ref 0.1–1.0)
Monocytes Relative: 8 % (ref 3–12)
Neutro Abs: 14.7 10*3/uL — ABNORMAL HIGH (ref 1.7–7.7)
Neutrophils Relative %: 89 % — ABNORMAL HIGH (ref 43–77)
Platelets: 226 10*3/uL (ref 150–400)
RBC: 4.62 MIL/uL (ref 3.87–5.11)
RDW: 15.1 % (ref 11.5–15.5)
WBC: 16.6 10*3/uL — ABNORMAL HIGH (ref 4.0–10.5)

## 2013-10-04 LAB — COMPREHENSIVE METABOLIC PANEL
ALT: 8 U/L (ref 0–35)
AST: 15 U/L (ref 0–37)
Albumin: 3.5 g/dL (ref 3.5–5.2)
Alkaline Phosphatase: 68 U/L (ref 39–117)
Anion gap: 17 — ABNORMAL HIGH (ref 5–15)
BUN: 22 mg/dL (ref 6–23)
CO2: 27 mEq/L (ref 19–32)
Calcium: 9.6 mg/dL (ref 8.4–10.5)
Chloride: 96 mEq/L (ref 96–112)
Creatinine, Ser: 1.13 mg/dL — ABNORMAL HIGH (ref 0.50–1.10)
GFR calc Af Amer: 54 mL/min — ABNORMAL LOW (ref >90)
GFR calc non Af Amer: 46 mL/min — ABNORMAL LOW (ref >90)
Glucose, Bld: 127 mg/dL — ABNORMAL HIGH (ref 70–99)
Potassium: 4.5 mEq/L (ref 3.7–5.3)
Sodium: 140 mEq/L (ref 137–147)
Total Bilirubin: 0.3 mg/dL (ref 0.3–1.2)
Total Protein: 7.2 g/dL (ref 6.0–8.3)

## 2013-10-04 LAB — CK: Total CK: 93 U/L (ref 7–177)

## 2013-10-04 LAB — TROPONIN I: Troponin I: <0.3 ng/mL (ref <0.30)

## 2013-10-04 MED ORDER — LOPERAMIDE HCL 2 MG PO CAPS
4.0000 mg | ORAL_CAPSULE | Freq: Once | ORAL | Status: AC
  Administered 2013-10-04: 4 mg via ORAL
  Filled 2013-10-04: qty 2

## 2013-10-04 MED ORDER — DIPHENHYDRAMINE HCL 50 MG/ML IJ SOLN
12.5000 mg | Freq: Once | INTRAMUSCULAR | Status: AC
  Administered 2013-10-04: 12.5 mg via INTRAVENOUS
  Filled 2013-10-04: qty 1

## 2013-10-04 MED ORDER — METOCLOPRAMIDE HCL 5 MG/ML IJ SOLN
5.0000 mg | Freq: Once | INTRAMUSCULAR | Status: AC
  Administered 2013-10-04: 5 mg via INTRAVENOUS
  Filled 2013-10-04: qty 2

## 2013-10-04 MED ORDER — ONDANSETRON HCL 4 MG/2ML IJ SOLN
4.0000 mg | Freq: Once | INTRAMUSCULAR | Status: AC
  Administered 2013-10-04: 4 mg via INTRAVENOUS
  Filled 2013-10-04: qty 2

## 2013-10-04 MED ORDER — LORAZEPAM 2 MG/ML IJ SOLN
0.5000 mg | Freq: Once | INTRAMUSCULAR | Status: AC
  Administered 2013-10-04: 0.5 mg via INTRAVENOUS
  Filled 2013-10-04: qty 1

## 2013-10-04 MED ORDER — LORAZEPAM 2 MG/ML IJ SOLN
0.5000 mg | Freq: Once | INTRAMUSCULAR | Status: AC
  Administered 2013-10-04: 0.5 mg via INTRAVENOUS

## 2013-10-04 MED ORDER — SODIUM CHLORIDE 0.9 % IV SOLN
1000.0000 mL | Freq: Once | INTRAVENOUS | Status: AC
  Administered 2013-10-05: 1000 mL via INTRAVENOUS

## 2013-10-04 MED ORDER — SODIUM CHLORIDE 0.9 % IV SOLN: 1000.0000 mL | INTRAVENOUS | Status: DC

## 2013-10-04 NOTE — ED Notes (Signed)
Per EMS PT had near syncopal episode after vomiting several time. PT also has diarrhea. PT reported some CP prior to n/v/d. Took nitro x1 at home. PT came from home. PT has IV in L AC and ~500cc NS. Lungs CTA bilat. BP 86/54 and now 134/76 most recent. Zofran 4mg  IV en route. CBG 236.

## 2013-10-04 NOTE — ED Notes (Signed)
MD at bedside.- Tomi Bamberger

## 2013-10-04 NOTE — ED Notes (Signed)
PT having paradoxical reaction to either reglan or benadryl- [facility] approved ativan for PT's TD

## 2013-10-04 NOTE — ED Provider Notes (Signed)
CSN: [id number]     Arrival date & time 10/04/13  2032 History   First MD Initiated Contact with Patient 10/04/13 2051     Chief Complaint  Patient presents with  . Emesis  . Diarrhea     (Consider location/radiation/quality/duration/timing/severity/associated sxs/prior Treatment) HPI Patient reports she felt fine this morning. She ate frosted mini wheats for breakfast. For lunch she ate a Bratwurst. She reports about 1:00 she started having nausea. She took some Emetrol which usually controls her stomach problems. However then she started having vomiting and had vomiting episodes about every 20 minutes x3 then she started having diarrhea x3. She then states she started having vomiting and diarrhea at the same time. She states she has some abdominal cramping that started up high and then it went down low and now starting up high again. She denies seeing any blood in her vomitus or diarrhea. She states her diarrhea was very watery. She states she was sweaty when she was having the vomiting and diarrhea. At one point about hour and a half prior to arrival she started having loss of vision while sitting on the toilet. Her daughter states that she passed out while sitting on the toilet and was unconscious about 30-60 seconds. She then had some confusion for about 10-15 seconds. She  did have one episode of chest pain that lasted a few minutes but was resolved with one sublingual nitroglycerin. The pain was located in the lower center of her chest and went around to her left. She has had this chest pain before. She reports she has 2 cardiac stents. She has not been around anybody else is ill. Nobody else ate the same food that she ate today. She has not been on any antibiotics recently. The only other change in activity that she had was yesterday she mowed a 12 foot steep bank with a walking push mower for the first time. She was outside in the heat for over 2 hours.  PCP [name] Cardiologist Dr  Ellyn Hack  Past Medical History  Diagnosis Date  . CAD S/P percutaneous coronary angioplasty 2007; March 2011    [facility] 3.0 mm 20 mm postdilated 3.6 mm in early mid LAD; status post ISR Cutting Balloon PTCA and March 11 along with PCI of distal mid lesion with a 3.0 mm 12 mm MultiLink vision BMS; the proximal stent causes jailing of SP1 and SP2 with ostial 70-80% lesions  . Diabetes mellitus type 2 with neurological manifestations   . Peripheral neuropathy   . Hypertension, essential, benign   . Dyslipidemia, goal LDL below 70   . Cancer      unspecified  . Arthritis      osteoarthritis  . Anxiety   . History of hematuria     Followed by Dr. Gaynelle Arabian   Past Surgical History  Procedure Laterality Date  . Abdominal hysterectomy    . Bladder suspension    . Appendectomy    . Cholecystectomy    . Nm myoview ltd  11/16/2011    5 beats a PVC during recovery.  No skin or infarction.  Reached 5 METs, EKG negative for ischemia   . Doppler echocardiography  12/31/2009    EF 55-60%, normal LV size and function, Grade 1 diastolic dysfunction with mild TR  . Carotid doppler  06/24/2009    40-59% R ICA stenosis. No significant ICA stenosis noted  . Cardiac catheterization  02/24/2006    85% stenosis in the proximal portion of  LAD-arrangements made for PCI on 02/25/2006  . Cardiac catheterization  02/25/2006    80% LAD lesion stented with a 3x59mm Liberte stent resulting in reduction of 80% lesion to 0% residual  . Cardiac catheterization  04/26/2006    Medical management  . Cardiac catheterization  06/24/2009    60-70% re-stenosis in the proximal LAD. A 3.25x15 cutting balloon, 3 inflations - 14atm-38sec, 13atm-39sec, and 12atm-40sec reduced to less than 10%. 60% stenosis of the mid/distal LAD stented with a 3x10mm Multilink stent.  . Cardiac catheterization  07/09/2009    Medical management   Family History  Problem Relation Age of Onset  . Diabetes Mother   . Hypertension Mother   .  Kidney disease Mother   . Hypertension Father   . Emphysema Father   . Heart attack Father   . Heart disease Father   . Arthritis Sister   . Diabetes Sister   . Hypertension Sister   . Hypertension Brother   . Hyperlipidemia Brother   . Diabetes Brother   . Stroke Brother   . Diabetes Sister   . Hypertension Sister   . Cancer Sister     Cervical cancer  . Hypertension Brother   . Heart attack Daughter   . Hypertension Daughter   . COPD Daughter   . Cancer Daughter     Breast cancer  . Kidney disease Daughter     Kidney mass   History  Substance Use Topics  . Smoking status: Never Smoker   . Smokeless tobacco: Not on file  . Alcohol Use: No   lives at home Lives with spouse   OB History   Grav Para Term Preterm Abortions TAB SAB Ect Mult Living                 Review of Systems  All other systems reviewed and are negative.     Allergies  Lipitor; Nsaids; Reglan; and Ultram  Home Medications   Prior to Admission medications   Medication Sig Start Date End Date Taking? Authorizing Provider  aspirin EC 325 MG tablet Take 325 mg by mouth every evening.    Historical Provider, MD  bethanechol (URECHOLINE) 25 MG tablet Take 25 mg by mouth daily.     Historical Provider, MD  bumetanide (BUMEX) 1 MG tablet Take 1.5 tablets (1.5 mg total) by mouth daily. 09/26/13   Leonie Man, MD  cholecalciferol (VITAMIN D) 1000 UNITS tablet Take 2,000 Units by mouth daily.    Historical Provider, MD  clopidogrel (PLAVIX) 75 MG tablet TAKE 1 TABLET DAILY 12/29/12   Leonie Man, MD  COD LIVER OIL PO Take 1 tablet by mouth daily.    Historical Provider, MD  diazepam (VALIUM) 5 MG tablet Take 2.5-5 mg by mouth 2 (two) times daily. Taking 2.5 mg in the morning and 1- 5mg  at bedtime    Historical Provider, MD  estrogens, conjugated, (PREMARIN) 0.3 MG tablet Take 0.3 mg by mouth every evening.     Historical Provider, MD  Flaxseed, Linseed, (FLAX SEED OIL PO) Take 1 tablet by mouth  2 (two) times daily.     Historical Provider, MD  losartan (COZAAR) 50 MG tablet Take 50 mg by mouth 2 (two) times daily. 03/19/13   Leonie Man, MD  metFORMIN (GLUCOPHAGE) 500 MG tablet Take 500 mg by mouth 2 (two) times daily with a meal.    Historical Provider, MD  Methylfol-Methylcob-Acetylcyst (CEREFOLIN NAC) 6-2-600 MG TABS Take 1 capsule by mouth  daily. Take 1 tablet by mouth daily 07/11/13   Philmore Pali, NP  Methylfol-Methylcob-Acetylcyst (CEREFOLIN NAC) 6-2-600 MG TABS Take 1 capsule by mouth daily. Take 1 tablet by mouth daily 07/11/13   Philmore Pali, NP  metroNIDAZOLE (METROCREAM) 0.75 % cream Apply topically 2 (two) times daily.    Historical Provider, MD  Multiple Vitamins-Minerals (HAIR/SKIN/NAILS PO) Take 1 capsule by mouth every morning.    Historical Provider, MD  nitroGLYCERIN (NITROLINGUAL) 0.4 MG/SPRAY spray Place 1 spray under the tongue every 5 (five) minutes as needed for chest pain.    Leonie Man, MD  pantoprazole (PROTONIX) 40 MG tablet Take 1 tablet (40 mg total) by mouth daily. 04/28/13   Leonie Man, MD  rosuvastatin (CRESTOR) 10 MG tablet Take 10 mg by mouth every evening.     Historical Provider, MD  urea (CARMOL) 40 % CREA Apply topically daily. prn    Historical Provider, MD   BP 129/76  Pulse 96  Temp(Src) 98.2 F (36.8 C) (Oral)  Resp 21  Ht 5\' 7"  (1.702 m)  Wt 171 lb (77.565 kg)  BMI 26.78 kg/m2  SpO2 97%  Vital signs normal    Physical Exam  Nursing note and vitals reviewed. Constitutional: She is oriented to person, place, and time. She appears well-developed and well-nourished.  Non-toxic appearance. She does not appear ill. No distress.  HENT:  Head: Normocephalic and atraumatic.  Right Ear: External ear normal.  Left Ear: External ear normal.  Nose: Nose normal. No mucosal edema or rhinorrhea.  Mouth/Throat: Oropharynx is clear and moist and mucous membranes are normal. No dental abscesses or uvula swelling.  Eyes: Conjunctivae and EOM  are normal. Pupils are equal, round, and reactive to light.  Neck: Normal range of motion and full passive range of motion without pain. Neck supple.  Cardiovascular: Normal rate, regular rhythm and normal heart sounds.  Exam reveals no gallop and no friction rub.   No murmur heard. Pulmonary/Chest: Effort normal and breath sounds normal. No respiratory distress. She has no wheezes. She has no rhonchi. She has no rales. She exhibits no tenderness and no crepitus.  Abdominal: Soft. Normal appearance and bowel sounds are normal. She exhibits no distension. There is no tenderness. There is no rebound and no guarding.  Musculoskeletal: Normal range of motion. She exhibits no edema and no tenderness.  Moves all extremities well.   Neurological: She is alert and oriented to person, place, and time. She has normal strength. No cranial nerve deficit.  Skin: Skin is warm, dry and intact. No rash noted. No erythema. No pallor.  Psychiatric: She has a normal mood and affect. Her speech is normal and behavior is normal. Her mood appears not anxious.    ED Course  Procedures (including critical care time)  Medications  0.9 %  sodium chloride infusion (not administered)    Followed by  0.9 %  sodium chloride infusion (not administered)  loperamide (IMODIUM) capsule 4 mg (4 mg Oral Given 10/04/13 2140)  ondansetron (ZOFRAN) injection 4 mg (4 mg Intravenous Given 10/04/13 2140)  metoCLOPramide (REGLAN) injection 5 mg (5 mg Intravenous Given 10/04/13 2227)  diphenhydrAMINE (BENADRYL) injection 12.5 mg (12.5 mg Intravenous Given 10/04/13 2228)  LORazepam (ATIVAN) injection 0.5 mg (0.5 mg Intravenous Given 10/04/13 2253)  LORazepam (ATIVAN) injection 0.5 mg (0.5 mg Intravenous Given 10/04/13 2318)   Patient was complaining of headache to the nurse. She states she can't take Tylenol "because of my kidneys". She was given  IV Reglan and Benadryl after which she was having some spasmodic jerking. She was given Ativan for  that.  2330 and went over her laboratory results with her daughter. Patient was sleeping. She however states she She does not have any more nausea. She was given Ativan for her jerking. The jerking was slowly improving. We discussed admission overnight to observation for continued IV fluids and to make sure her mild renal insufficiency improves and also for observation for her syncope.  2337 Dr. Ernestina Patches will see patient and to her admission orders.   Labs Review Results for orders placed during the hospital encounter of 10/04/13  CBC WITH DIFFERENTIAL      Result Value Ref Range   WBC 16.6 (*) 4.0 - 10.5 K/uL   RBC 4.62  3.87 - 5.11 MIL/uL   Hemoglobin 13.2  12.0 - 15.0 g/dL   HCT 39.6  36.0 - 46.0 %   MCV 85.7  78.0 - 100.0 fL   MCH 28.6  26.0 - 34.0 pg   MCHC 33.3  30.0 - 36.0 g/dL   RDW 15.1  11.5 - 15.5 %   Platelets 226  150 - 400 K/uL   Neutrophils Relative % 89 (*) 43 - 77 %   Neutro Abs 14.7 (*) 1.7 - 7.7 K/uL   Lymphocytes Relative 3 (*) 12 - 46 %   Lymphs Abs 0.5 (*) 0.7 - 4.0 K/uL   Monocytes Relative 8  3 - 12 %   Monocytes Absolute 1.4 (*) 0.1 - 1.0 K/uL   Eosinophils Relative 0  0 - 5 %   Eosinophils Absolute 0.0  0.0 - 0.7 K/uL   Basophils Relative 0  0 - 1 %   Basophils Absolute 0.0  0.0 - 0.1 K/uL  COMPREHENSIVE METABOLIC PANEL      Result Value Ref Range   Sodium 140  137 - 147 mEq/L   Potassium 4.5  3.7 - 5.3 mEq/L   Chloride 96  96 - 112 mEq/L   CO2 27  19 - 32 mEq/L   Glucose, Bld 127 (*) 70 - 99 mg/dL   BUN 22  6 - 23 mg/dL   Creatinine, Ser 1.13 (*) 0.50 - 1.10 mg/dL   Calcium 9.6  8.4 - 10.5 mg/dL   Total Protein 7.2  6.0 - 8.3 g/dL   Albumin 3.5  3.5 - 5.2 g/dL   AST 15  0 - 37 U/L   ALT 8  0 - 35 U/L   Alkaline Phosphatase 68  39 - 117 U/L   Total Bilirubin 0.3  0.3 - 1.2 mg/dL   GFR calc non Af Amer 46 (*) >90 mL/min   GFR calc Af Amer 54 (*) >90 mL/min   Anion gap 17 (*) 5 - 15  CK      Result Value Ref Range   Total CK 93  7 - 177 U/L   TROPONIN I      Result Value Ref Range   Troponin I <0.30  <0.30 ng/mL   Laboratory interpretation all normal except mild new renal insufficiency consistent with dehydration, leukocytosis consistent with history of vomiting     Imaging Review No results found.   EKG Interpretation   Date/Time:  Thursday October 04 2013 20:45:19 EDT Ventricular Rate:  91 PR Interval:  174 QRS Duration: 101 QT Interval:  380 QTC Calculation: 467 R Axis:   70 Text Interpretation:  Sinus rhythm Normal ECG No significant change since  last tracing 01 Jan 2010 Confirmed by [facility]  MD-I, Sargun Rummell ([id number]) on 10/04/2013  8:50:50 PM      MDM   Final diagnoses:  Syncope, unspecified syncope type  Nausea vomiting and diarrhea    Plan admission   Rolland Porter, MD, Alanson Aly, MD 10/04/13 [phone]

## 2013-10-05 ENCOUNTER — Inpatient Hospital Stay (HOSPITAL_COMMUNITY): Payer: Medicare Other

## 2013-10-05 ENCOUNTER — Encounter (HOSPITAL_COMMUNITY): Payer: Self-pay | Admitting: Surgery

## 2013-10-05 DIAGNOSIS — I517 Cardiomegaly: Secondary | ICD-10-CM | POA: Diagnosis not present

## 2013-10-05 DIAGNOSIS — R079 Chest pain, unspecified: Secondary | ICD-10-CM | POA: Diagnosis not present

## 2013-10-05 DIAGNOSIS — R55 Syncope and collapse: Secondary | ICD-10-CM | POA: Diagnosis not present

## 2013-10-05 DIAGNOSIS — I2510 Atherosclerotic heart disease of native coronary artery without angina pectoris: Secondary | ICD-10-CM

## 2013-10-05 DIAGNOSIS — I10 Essential (primary) hypertension: Secondary | ICD-10-CM

## 2013-10-05 DIAGNOSIS — Z9861 Coronary angioplasty status: Secondary | ICD-10-CM

## 2013-10-05 LAB — GLUCOSE, CAPILLARY
Glucose-Capillary: 105 mg/dL — ABNORMAL HIGH (ref 70–99)
Glucose-Capillary: 69 mg/dL — ABNORMAL LOW (ref 70–99)
Glucose-Capillary: 94 mg/dL (ref 70–99)
Glucose-Capillary: 105 mg/dL — ABNORMAL HIGH (ref 70–99)
Glucose-Capillary: 128 mg/dL — ABNORMAL HIGH (ref 70–99)
Glucose-Capillary: 117 mg/dL — ABNORMAL HIGH (ref 70–99)

## 2013-10-05 LAB — COMPREHENSIVE METABOLIC PANEL
ALT: 7 U/L (ref 0–35)
AST: 13 U/L (ref 0–37)
Albumin: 2.6 g/dL — ABNORMAL LOW (ref 3.5–5.2)
Alkaline Phosphatase: 50 U/L (ref 39–117)
Anion gap: 15 (ref 5–15)
BUN: 18 mg/dL (ref 6–23)
CO2: 24 mEq/L (ref 19–32)
Calcium: 8.3 mg/dL — ABNORMAL LOW (ref 8.4–10.5)
Chloride: 100 mEq/L (ref 96–112)
Creatinine, Ser: 1 mg/dL (ref 0.50–1.10)
GFR calc Af Amer: 62 mL/min — ABNORMAL LOW (ref >90)
GFR calc non Af Amer: 54 mL/min — ABNORMAL LOW (ref >90)
Glucose, Bld: 123 mg/dL — ABNORMAL HIGH (ref 70–99)
Potassium: 4.1 mEq/L (ref 3.7–5.3)
Sodium: 139 mEq/L (ref 137–147)
Total Bilirubin: <0.2 mg/dL — ABNORMAL LOW (ref 0.3–1.2)
Total Protein: 5.4 g/dL — ABNORMAL LOW (ref 6.0–8.3)

## 2013-10-05 LAB — CBC
HCT: 36 % (ref 36.0–46.0)
Hemoglobin: 11.9 g/dL — ABNORMAL LOW (ref 12.0–15.0)
MCH: 27.9 pg (ref 26.0–34.0)
MCHC: 33.1 g/dL (ref 30.0–36.0)
MCV: 84.5 fL (ref 78.0–100.0)
Platelets: 210 10*3/uL (ref 150–400)
RBC: 4.26 MIL/uL (ref 3.87–5.11)
RDW: 15.1 % (ref 11.5–15.5)
WBC: 12 10*3/uL — ABNORMAL HIGH (ref 4.0–10.5)

## 2013-10-05 LAB — CREATININE, SERUM
Creatinine, Ser: 1.07 mg/dL (ref 0.50–1.10)
GFR calc Af Amer: 57 mL/min — ABNORMAL LOW (ref >90)
GFR calc non Af Amer: 49 mL/min — ABNORMAL LOW (ref >90)

## 2013-10-05 LAB — CBC WITH DIFFERENTIAL/PLATELET
Basophils Absolute: 0 10*3/uL (ref 0.0–0.1)
Basophils Relative: 0 % (ref 0–1)
Eosinophils Absolute: 0 10*3/uL (ref 0.0–0.7)
Eosinophils Relative: 0 % (ref 0–5)
HCT: 33.4 % — ABNORMAL LOW (ref 36.0–46.0)
Hemoglobin: 10.9 g/dL — ABNORMAL LOW (ref 12.0–15.0)
Lymphocytes Relative: 6 % — ABNORMAL LOW (ref 12–46)
Lymphs Abs: 0.6 10*3/uL — ABNORMAL LOW (ref 0.7–4.0)
MCH: 27.6 pg (ref 26.0–34.0)
MCHC: 32.6 g/dL (ref 30.0–36.0)
MCV: 84.6 fL (ref 78.0–100.0)
Monocytes Absolute: 0.6 10*3/uL (ref 0.1–1.0)
Monocytes Relative: 6 % (ref 3–12)
Neutro Abs: 8.6 10*3/uL — ABNORMAL HIGH (ref 1.7–7.7)
Neutrophils Relative %: 88 % — ABNORMAL HIGH (ref 43–77)
Platelets: 208 10*3/uL (ref 150–400)
RBC: 3.95 MIL/uL (ref 3.87–5.11)
RDW: 15.1 % (ref 11.5–15.5)
WBC: 9.8 10*3/uL (ref 4.0–10.5)

## 2013-10-05 LAB — URINALYSIS, ROUTINE W REFLEX MICROSCOPIC
Bilirubin Urine: NEGATIVE
Glucose, UA: NEGATIVE mg/dL
Hgb urine dipstick: NEGATIVE
Ketones, ur: NEGATIVE mg/dL
Leukocytes, UA: NEGATIVE
Nitrite: NEGATIVE
Protein, ur: NEGATIVE mg/dL
Specific Gravity, Urine: 1.014 (ref 1.005–1.030)
Urobilinogen, UA: 0.2 mg/dL (ref 0.0–1.0)
pH: 6 (ref 5.0–8.0)

## 2013-10-05 LAB — TROPONIN I
Troponin I: <0.3 ng/mL (ref <0.30)
Troponin I: <0.3 ng/mL (ref <0.30)
Troponin I: <0.3 ng/mL (ref <0.30)

## 2013-10-05 LAB — HEMOGLOBIN A1C
Hgb A1c MFr Bld: 5.9 % — ABNORMAL HIGH (ref <5.7)
Mean Plasma Glucose: 123 mg/dL — ABNORMAL HIGH (ref <117)

## 2013-10-05 LAB — TSH: TSH: 1.92 u[IU]/mL (ref 0.350–4.500)

## 2013-10-05 LAB — PRO B NATRIURETIC PEPTIDE: Pro B Natriuretic peptide (BNP): 262.9 pg/mL (ref 0–450)

## 2013-10-05 MED ORDER — NITROGLYCERIN 0.4 MG SL SUBL: 0.4000 mg | SUBLINGUAL_TABLET | SUBLINGUAL | Status: DC | PRN

## 2013-10-05 MED ORDER — VITAMIN D3 25 MCG (1000 UNIT) PO TABS
1000.0000 [IU] | ORAL_TABLET | Freq: Every day | ORAL | Status: DC
  Administered 2013-10-05 – 2013-10-07 (×3): 1000 [IU] via ORAL
  Filled 2013-10-05 (×3): qty 1

## 2013-10-05 MED ORDER — ONDANSETRON HCL 4 MG PO TABS
4.0000 mg | ORAL_TABLET | Freq: Three times a day (TID) | ORAL | Status: DC | PRN
  Administered 2013-10-05 – 2013-10-06 (×2): 4 mg via ORAL
  Filled 2013-10-05 (×2): qty 1

## 2013-10-05 MED ORDER — ASPIRIN EC 81 MG PO TBEC
81.0000 mg | DELAYED_RELEASE_TABLET | Freq: Every day | ORAL | Status: DC
  Administered 2013-10-05 – 2013-10-07 (×3): 81 mg via ORAL
  Filled 2013-10-05 (×3): qty 1

## 2013-10-05 MED ORDER — NITROGLYCERIN 0.4 MG/SPRAY TL SOLN: 1.0000 | Status: DC | PRN

## 2013-10-05 MED ORDER — CIPROFLOXACIN IN D5W 400 MG/200ML IV SOLN
400.0000 mg | Freq: Two times a day (BID) | INTRAVENOUS | Status: DC
  Administered 2013-10-05: 400 mg via INTRAVENOUS
  Filled 2013-10-05 (×4): qty 200

## 2013-10-05 MED ORDER — SODIUM CHLORIDE 0.9 % IJ SOLN
3.0000 mL | Freq: Two times a day (BID) | INTRAMUSCULAR | Status: DC
  Administered 2013-10-05 – 2013-10-07 (×5): 3 mL via INTRAVENOUS

## 2013-10-05 MED ORDER — CIPROFLOXACIN IN D5W 400 MG/200ML IV SOLN
400.0000 mg | Freq: Two times a day (BID) | INTRAVENOUS | Status: DC
  Administered 2013-10-05 – 2013-10-06 (×2): 400 mg via INTRAVENOUS
  Filled 2013-10-05 (×3): qty 200

## 2013-10-05 MED ORDER — SODIUM CHLORIDE 0.9 % IV SOLN
INTRAVENOUS | Status: DC
  Administered 2013-10-05: 03:00:00 via INTRAVENOUS

## 2013-10-05 MED ORDER — METRONIDAZOLE IN NACL 5-0.79 MG/ML-% IV SOLN
500.0000 mg | Freq: Three times a day (TID) | INTRAVENOUS | Status: DC
  Administered 2013-10-05: 500 mg via INTRAVENOUS
  Filled 2013-10-05 (×3): qty 100

## 2013-10-05 MED ORDER — ONDANSETRON HCL 4 MG/2ML IJ SOLN: 4.0000 mg | Freq: Four times a day (QID) | INTRAMUSCULAR | Status: DC | PRN

## 2013-10-05 MED ORDER — CLOPIDOGREL BISULFATE 75 MG PO TABS
75.0000 mg | ORAL_TABLET | Freq: Every day | ORAL | Status: DC
  Administered 2013-10-05 – 2013-10-07 (×3): 75 mg via ORAL
  Filled 2013-10-05 (×3): qty 1

## 2013-10-05 MED ORDER — HEPARIN SODIUM (PORCINE) 5000 UNIT/ML IJ SOLN
5000.0000 [IU] | Freq: Three times a day (TID) | INTRAMUSCULAR | Status: DC
  Administered 2013-10-05 – 2013-10-07 (×8): 5000 [IU] via SUBCUTANEOUS
  Filled 2013-10-05 (×10): qty 1

## 2013-10-05 MED ORDER — METRONIDAZOLE IN NACL 5-0.79 MG/ML-% IV SOLN
500.0000 mg | Freq: Three times a day (TID) | INTRAVENOUS | Status: DC
  Administered 2013-10-05 – 2013-10-06 (×3): 500 mg via INTRAVENOUS
  Filled 2013-10-05 (×5): qty 100

## 2013-10-05 MED ORDER — OMEGA-3-ACID ETHYL ESTERS 1 G PO CAPS
1000.0000 mg | ORAL_CAPSULE | Freq: Every day | ORAL | Status: DC
  Administered 2013-10-05 – 2013-10-07 (×3): 1000 mg via ORAL
  Filled 2013-10-05 (×3): qty 1

## 2013-10-05 NOTE — Progress Notes (Signed)
SUBJECTIVE:  No further dizziness.  Had sharp CP last PM  OBJECTIVE:   Vitals:   Filed Vitals:   10/05/13 0101 10/05/13 0115 10/05/13 0151 10/05/13 0532  BP:  118/55 125/75 111/63  Pulse:  111 96 90  Temp: 99.9 F (37.7 C)  98.8 F (37.1 C) 98.8 F (37.1 C)  TempSrc: Oral  Oral Oral  Resp:  28 18 20   Height:   5\' 7"  (1.702 m)   Weight:   176 lb 1.6 oz (79.878 kg) 169 lb 8 oz (76.885 kg)  SpO2:  94% 94% 93%   I&O's:   Intake/Output Summary (Last 24 hours) at 10/05/13 0743 Last data filed at 10/05/13 0530  Gross per 24 hour  Intake    500 ml  Output    150 ml  Net    350 ml   TELEMETRY: Reviewed telemetry pt in NSR:     PHYSICAL EXAM General: Well developed, well nourished, in no acute distress Head: Eyes PERRLA, No xanthomas.   Normal cephalic and atramatic  Lungs:   Clear bilaterally to auscultation and percussion. Heart:   HRRR S1 S2 Pulses are 2+ & equal. Abdomen: Bowel sounds are positive, abdomen soft and non-tender without masses  Extremities:   No clubbing, cyanosis or edema.  DP +1 Neuro: Alert and oriented X 3. Psych:  Good affect, responds appropriately   LABS: Basic Metabolic Panel:  Recent Labs  10/04/13 2145 10/05/13 0107 10/05/13 0430  NA 140  --  139  K 4.5  --  4.1  CL 96  --  100  CO2 27  --  24  GLUCOSE 127*  --  123*  BUN 22  --  18  CREATININE 1.13* 1.07 1.00  CALCIUM 9.6  --  8.3*   Liver Function Tests:  Recent Labs  10/04/13 2145 10/05/13 0430  AST 15 13  ALT 8 7  ALKPHOS 68 50  BILITOT 0.3 <0.2*  PROT 7.2 5.4*  ALBUMIN 3.5 2.6*   No results found for this basename: LIPASE, AMYLASE,  in the last 72 hours CBC:  Recent Labs  10/04/13 2145 10/05/13 0107 10/05/13 0430  WBC 16.6* 12.0* 9.8  NEUTROABS 14.7*  --  8.6*  HGB 13.2 11.9* 10.9*  HCT 39.6 36.0 33.4*  MCV 85.7 84.5 84.6  PLT 226 210 208   Cardiac Enzymes:  Recent Labs  10/04/13 2145 10/05/13 0107 10/05/13 0430  CKTOTAL 93  --   --   TROPONINI  <0.30 <0.30 <0.30   BNP: No components found with this basename: POCBNP,  D-Dimer: No results found for this basename: DDIMER,  in the last 72 hours Hemoglobin A1C: No results found for this basename: HGBA1C,  in the last 72 hours Fasting Lipid Panel: No results found for this basename: CHOL, HDL, LDLCALC, TRIG, CHOLHDL, LDLDIRECT,  in the last 72 hours Thyroid Function Tests:  Recent Labs  10/05/13 0107  TSH 1.920   Anemia Panel: No results found for this basename: VITAMINB12, FOLATE, FERRITIN, TIBC, IRON, RETICCTPCT,  in the last 72 hours Coag Panel:   Lab Results  Component Value Date   INR 0.96 09/26/2009   INR 1.04 07/08/2009   INR 0.94 06/23/2009    RADIOLOGY: No results found.  Assessment and Plan:  Chest pain- atypical and chronic in pt with hx 1 vessel CAD s/p PCI of LAD in 2011 and nl stress test on 2013  Syncope - most likely orthostatic from taking an NTG spray in setting of  chronic orthostasis HX of HTN and now with low BP  DM type II  -Possible that the pt has baseline low BP ( as eluded by Dr Allison Quarry note 09/18/2013 ) , took SL NTG and then developed further vagal response with vomiting with resultant syncope. She also had been working outside in the heat the day before so she could have been dehydrated to start with.  Also need to consider with Nausea, vomiting and diarrhea that she had an acute gastroenteritis or food poisoning -Her chest pain is chronic and atypical and unchanged in quality.Cardiac markers are negative x 3.   - Bumex and Imdur stopped,  losartan decreased  - Continue Toprol XL at 25QD  - Will follow up on echo ordered by primary team     Sueanne Margarita, MD  10/05/2013  7:43 AM

## 2013-10-05 NOTE — Progress Notes (Signed)
Patient not able to void due to multiple bladder surgeries and has a bladder swing. Insertion of foley catheter performed. Inserted 73fr foley catheter with 10cc syringe sterile water. Patient tolerated well and had 500 cc of urine output from the foley. This morning patient states she feels a lot better than before. Currently complains of no pain or discomfort. Will continue to monitor patient to end of shift.

## 2013-10-05 NOTE — Progress Notes (Signed)
Shortly after being admitted orthostatic vital signs were ordered. Patient not able to perform orthostatic vital signs for she was too lethargic and felt nauseated. The daughter, who is also a CNA, was at bedside and when mentioned that we were going to do orthostatic vital signs, she immediately refused for her mother to do so after receiving the two doses of ativan in the ED. Will continue to monitor patient to end of shift.

## 2013-10-05 NOTE — Consult Note (Signed)
Cardiology Consultation Note  Patient ID: [name], MRN: [id number], DOB/AGE: 04/06/1940 73 y.o. 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  -Her chest pain is chronic and atypical and unchanged in quality. Would trend CE and monitor on tele  -Would stop bumex and Imdur, decrease losartan to 50QD and continue Toprol XL at 25QD  -Will follow up on echo ordered by primary team     73 yr old female with hx of CAD s/p LAD PCI last in 2011 , nl perfusion per stress test 10/2011 here with chest pain and syncope  HPI: pt states that she was resting after taking lunch yesterday when she had her typical episode of sharp shooting chest moving from substernal to left side of chest . She then took a SL NTG spray and went to the bathroom . There she became extremely nauseas with profuse vomitting and passed out while seated on the toilet seat . She was then brought to the ER. Over here her BP was 107/65 on arrival with HR of 86.  Per [name] note from 08/2013 pt was noted to have low BP at home and her ARB dose was decreased and there was further discussion of stopping her Imdur. She had been complaining of her chronic chest pain that was not thought to be anginal  Currently she also denies any orthopnea, PND , LE edema ,  focal weakness, syncope, bleeding diathesis , claudication , palpitation etc .  Reports medication compliance  Past Medical History  Diagnosis Date   . CAD S/P percutaneous coronary angioplasty 2007; March 2011    [facility] 3.0 mm 20 mm postdilated 3.6 mm in early mid LAD; status post ISR Cutting Balloon PTCA and March 11 along with PCI of distal mid lesion with a 3.0 mm 12 mm MultiLink vision BMS; the proximal stent causes jailing of SP1 and SP2 with ostial 70-80% lesions  . Diabetes mellitus type 2 with neurological manifestations   . Peripheral neuropathy   . Hypertension, essential, benign   . Dyslipidemia, goal LDL below 70   . Arthritis      osteoarthritis  . Anxiety   . History of hematuria     Followed by Dr. Gaynelle Arabian  . Stroke   . CHF (congestive heart failure)   . Dementia   . Chronic kidney disease   . Seizures   . Headache(784.0)   . Cancer     Melanoma, Squamous cell Carcinoma      Most Recent Cardiac Studies: .  Nm myoview ltd  11/16/2011  5 beats a PVC during recovery. No skin or infarction. Reached 5 METs, EKG negative for ischemia  .  Doppler echocardiography  12/31/2009  EF 55-60%, normal LV size and function, Grade 1 diastolic dysfunction with mild TR  .  Carotid doppler  06/24/2009  40-59% R ICA stenosis. No  significant ICA stenosis noted  .  Cardiac catheterization  02/24/2006  85% stenosis in the proximal portion of LAD-arrangements made for PCI on 02/25/2006  .  Cardiac catheterization  02/25/2006  80% LAD lesion stented with a 3x63mm [facility] stent resulting in reduction of 80% lesion to 0% residual  .  Cardiac catheterization  04/26/2006  Medical management  .  Cardiac catheterization  06/24/2009  60-70% re-stenosis in the proximal LAD. A 3.25x15 cutting balloon, 3 inflations - 14atm-38sec, 13atm-39sec, and 12atm-40sec reduced to less than 10%. 60% stenosis of the mid/distal LAD stented with a 3x40mm Multilink stent.  .  Cardiac catheterization  07/09/2009  Medical management    Stress test GXT SPECT stress test 10/2011 Nl perfusion and no ischemia on Exercise ECG      Surgical  History:  Past Surgical History  Procedure Laterality Date  . Abdominal hysterectomy    . Bladder suspension    . Appendectomy    . Cholecystectomy    . Nm myoview ltd  11/16/2011    5 beats a PVC during recovery.  No skin or infarction.  Reached 5 METs, EKG negative for ischemia   . Doppler echocardiography  12/31/2009    EF 55-60%, normal LV size and function, Grade 1 diastolic dysfunction with mild TR  . Carotid doppler  06/24/2009    40-59% R ICA stenosis. No significant ICA stenosis noted  . Cardiac catheterization  02/24/2006    85% stenosis in the proximal portion of LAD-arrangements made for PCI on 02/25/2006  . Cardiac catheterization  02/25/2006    80% LAD lesion stented with a 3x98mm [facility] stent resulting in reduction of 80% lesion to 0% residual  . Cardiac catheterization  04/26/2006    Medical management  . Cardiac catheterization  06/24/2009    60-70% re-stenosis in the proximal LAD. A 3.25x15 cutting balloon, 3 inflations - 14atm-38sec, 13atm-39sec, and 12atm-40sec reduced to less than 10%. 60% stenosis of the mid/distal LAD stented with a 3x36mm Multilink stent.  . Cardiac catheterization  07/09/2009    Medical management     Home Meds: Prior to Admission medications   Medication Sig Start Date End Date Taking? Authorizing Provider  aspirin 81 MG EC tablet Take 81 mg by mouth daily. Swallow whole.   Yes Historical Provider, MD  bethanechol (URECHOLINE) 25 MG tablet Take 25 mg by mouth daily.    Yes Historical Provider, MD  bumetanide (BUMEX) 1 MG tablet Take 1.5 tablets (1.5 mg total) by mouth daily. 09/26/13  Yes Leonie Man, MD  Calcium Carb-Cholecalciferol (CALCIUM 600 + D PO) Take 1 tablet by mouth 2 (two) times daily. Chewable   Yes Historical Provider, MD  cholecalciferol (VITAMIN D) 1000 UNITS tablet Take 1,000 Units by mouth daily.    Yes Historical Provider, MD  clopidogrel (PLAVIX) 75 MG tablet Take 75 mg by mouth daily.   Yes Historical Provider, MD  [facility]  Liver Oil 1000 MG CAPS Take 1,000 mg by mouth daily.   Yes Historical Provider, MD  CRANBERRY-VITAMIN C PO Take 1 capsule by mouth 2 (two) times daily.   Yes Historical Provider, MD  diazepam (VALIUM) 5 MG tablet Take 2.5-5 mg by mouth 2 (two) times daily. Taking 2.5 mg in the morning and 5mg  at bedtime   Yes Historical Provider, MD  divalproex (DEPAKOTE) 500 MG DR tablet Take 500 mg by mouth daily.   Yes Historical Provider, MD  estrogens, conjugated, (PREMARIN) 0.3 MG tablet Take 0.3 mg by mouth daily.  Yes Historical Provider, MD  gabapentin (NEURONTIN) 100 MG capsule Take 100 mg by mouth at bedtime.   Yes Historical Provider, MD  isosorbide mononitrate (IMDUR) 60 MG 24 hr tablet Take 60 mg by mouth daily.   Yes Historical Provider, MD  Astrid Drafts Omega-3 300 MG CAPS Take 300 mg by mouth daily.   Yes Historical Provider, MD  losartan (COZAAR) 50 MG tablet Take 50 mg by mouth 2 (two) times daily. 03/19/13  Yes Leonie Man, MD  metFORMIN (GLUCOPHAGE) 500 MG tablet Take 500 mg by mouth 2 (two) times daily with a meal.   Yes Historical Provider, MD  Methylfol-Methylcob-Acetylcyst (CEREFOLIN NAC) 6-2-600 MG TABS Take 1 capsule by mouth daily. Take 1 tablet by mouth daily 07/11/13  Yes Philmore Pali, NP  metoprolol succinate (TOPROL-XL) 25 MG 24 hr tablet Take 25 mg by mouth at bedtime.   Yes Historical Provider, MD  metroNIDAZOLE (METROCREAM) 0.75 % cream Apply 1 application topically 2 (two) times daily.    Yes Historical Provider, MD  Multiple Vitamins-Minerals (HAIR/SKIN/NAILS PO) Take 1 capsule by mouth every morning.   Yes Historical Provider, MD  niacin (NIASPAN) 500 MG CR tablet Take 500 mg by mouth at bedtime.   Yes Historical Provider, MD  nitroGLYCERIN (NITROLINGUAL) 0.4 MG/SPRAY spray Place 1 spray under the tongue every 5 (five) minutes as needed for chest pain.   Yes Leonie Man, MD  Omega-3 Fatty Acids (FISH OIL) 1200 MG CAPS Take 1,200 mg by mouth 2 (two) times daily.   Yes  Historical Provider, MD  pantoprazole (PROTONIX) 40 MG tablet Take 1 tablet (40 mg total) by mouth daily. 04/28/13  Yes Leonie Man, MD  pramipexole (MIRAPEX) 0.25 MG tablet Take 0.25 mg by mouth at bedtime.   Yes Historical Provider, MD  rosuvastatin (CRESTOR) 10 MG tablet Take 10 mg by mouth every evening.    Yes Historical Provider, MD  urea (CARMOL) 40 % CREA Apply 1 application topically daily as needed (for cracked heals). prn   Yes Historical Provider, MD    Inpatient Medications:  . aspirin EC  81 mg Oral Daily  . cholecalciferol  1,000 Units Oral Daily  . ciprofloxacin  400 mg Intravenous Q12H  . clopidogrel  75 mg Oral Daily  . heparin  5,000 Units Subcutaneous 3 times per day  . metronidazole  500 mg Intravenous Q8H  . omega-3 acid ethyl esters  1,000 mg Oral Daily  . sodium chloride  3 mL Intravenous Q12H   . sodium chloride 125 mL/hr at 10/05/13 0320    Allergies:  Allergies  Allergen Reactions  . Lipitor [Atorvastatin] Other (See Comments)    Bone and muscle pain  . Nsaids Swelling  . Reglan [Metoclopramide]     Tardive dyskinesia; paradoxical reaction not relieved by benadryl  . Ultram [Tramadol] Other (See Comments)    Pt has seizure activity with this medication    History   Social History  . Marital Status: Married    Spouse Name: N/A    Number of Children: N/A  . Years of Education: N/A   Occupational History  . Not on file.   Social History Main Topics  . Smoking status: Never Smoker   . Smokeless tobacco: Not on file  . Alcohol Use: No  . Drug Use: Not on file  . Sexual Activity: No   Other Topics Concern  . Not on file   Social History Narrative   She is a married mother  of 3, grandmother of 2.  Usually very active and out about the house.  But currently over last 6-8 weeks has been incapacitated.  She never smoked and does not drink alcohol.     Family History  Problem Relation Age of Onset  . Diabetes Mother   . Hypertension  Mother   . Kidney disease Mother   . Hypertension Father   . Emphysema Father   . Heart attack Father   . Heart disease Father   . Arthritis Sister   . Diabetes Sister   . Hypertension Sister   . Hypertension Brother   . Hyperlipidemia Brother   . Diabetes Brother   . Stroke Brother   . Diabetes Sister   . Hypertension Sister   . Cancer Sister     Cervical cancer  . Hypertension Brother   . Heart attack Daughter   . Hypertension Daughter   . COPD Daughter   . Cancer Daughter     Breast cancer  . Kidney disease Daughter     Kidney mass     Review of Systems: General: negative for chills, fever, night sweats or weight changes.  Cardiovascular: per HPI  Dermatological: negative for rash Respiratory: negative for cough or wheezing Urologic: negative for hematuria Abdominal: negative for nausea, vomiting, diarrhea, bright red blood per rectum, melena, or hematemesis Neurologic: negative for visual changes, syncope, or dizziness All other systems reviewed and are otherwise negative except as noted above.  Labs:  Recent Labs  10/04/13 2145 10/05/13 0107  CKTOTAL 93  --   TROPONINI <0.30 <0.30   Lab Results  Component Value Date   WBC 12.0* 10/05/2013   HGB 11.9* 10/05/2013   HCT 36.0 10/05/2013   MCV 84.5 10/05/2013   PLT 210 10/05/2013    Recent Labs Lab 10/04/13 2145 10/05/13 0107  NA 140  --   K 4.5  --   CL 96  --   CO2 27  --   BUN 22  --   CREATININE 1.13* 1.07  CALCIUM 9.6  --   PROT 7.2  --   BILITOT 0.3  --   ALKPHOS 68  --   ALT 8  --   AST 15  --   GLUCOSE 127*  --    Lab Results  Component Value Date   CHOL  Value: 148        ATP III CLASSIFICATION:  <200     mg/dL   Desirable  200-239  mg/dL   Borderline High  >=240    mg/dL   High        12/31/2009   HDL 74 12/31/2009   LDLCALC  Value: 47        Total Cholesterol/HDL:CHD Risk Coronary Heart Disease Risk Table                     Men   Women  1/2 Average Risk   3.4   3.3  Average Risk        5.0   4.4  2 X Average Risk   9.6   7.1  3 X Average Risk  23.4   11.0        Use the calculated Patient Ratio above and the CHD Risk Table to determine the patient's CHD Risk.        ATP III CLASSIFICATION (LDL):  <100     mg/dL   Optimal  100-129  mg/dL   Near or Above  Optimal  130-159  mg/dL   Borderline  160-189  mg/dL   High  >190     mg/dL   Very High 12/31/2009   TRIG 134 12/31/2009   Lab Results  Component Value Date   DDIMER  Value: 0.46        AT THE INHOUSE ESTABLISHED CUTOFF VALUE OF 0.48 ug/mL FEU, THIS ASSAY HAS BEEN DOCUMENTED IN THE LITERATURE TO HAVE A SENSITIVITY AND NEGATIVE PREDICTIVE VALUE OF AT LEAST 98 TO 99%.  THE TEST RESULT SHOULD BE CORRELATED WITH AN ASSESSMENT OF THE CLINICAL PROBABILITY OF DVT / VTE. 09/26/2009    Radiology/Studies:  No results found.  EKG07/11/2013 NSR   Physical Exam: Blood pressure 125/75, pulse 96, temperature 98.8 F (37.1 C), temperature source Oral, resp. rate 18, height 5\' 7"  (1.702 m), weight 79.878 kg (176 lb 1.6 oz), SpO2 94.00%. General: Well developed, well nourished, in no acute distress.  Neck: Negative for carotid bruits. JVD not elevated. Lungs: Clear bilaterally to auscultation without wheezes, rales, or rhonchi. Breathing is unlabored. Heart: RRR with S1 S2. No murmurs, rubs, or gallops appreciated. Abdomen: Soft, non-tender, non-distended with normoactive bowel sounds. No hepatomegaly. No rebound/guarding. No obvious abdominal masses. Extremities: No clubbing or cyanosis. No edema.  Distal pedal pulses are 2+ and equal bilaterally. Neuro: Alert and oriented X 3. No facial asymmetry. . Moves all extremities spontaneously. Psych:  Responds to questions appropriately with a normal affect.       Cory Roughen, A M.D  10/05/2013, 4:16 AM

## 2013-10-05 NOTE — ED Notes (Signed)
Attempted to call report

## 2013-10-05 NOTE — Progress Notes (Signed)
Utilization review completed.  

## 2013-10-05 NOTE — Progress Notes (Signed)
Patient seen and evaluated earlier this AM by my associate. Please refer to his H and P regarding assessment and plan for further details.  Per cardiology consult syncope most likely 2ary to use of ntg spray in the setting of chronic orthostasis and baseline low blood pressures.  Advance diet to diabetic diet and saline lock.  Will reassess next am  Tammy Wickliffe, Linward Foster

## 2013-10-05 NOTE — H&P (Addendum)
Hospitalist Admission History and Physical  Patient name: [name] Medical record number: [id number] Date of birth: [birth year] Age: 73 y.o. Gender: female  Primary Care Provider: Glo Herring., MD  Chief Complaint: syncope   History of Present Illness:This is a 73 y.o. year old female with significant past medical history of CAD s/p stenting, HTN, dementia presenting with syncope. Per the family, pt mowed her yard yesterday, which is something that she normally does not do. Pt has uneventful night. However, pt woke up this morning with L sided/central CP. Pt took SL NTG with resolution of CP. Approx 1 hour later, pt had reported uncontrolled nausea and vomiting and generalized malaise. Pt had witnessed syncopal episode by daughter where pt was unresponsive for approx 1 minute. Pt woke up confused. Mentation returned within 1-2 minutes. Per the daughter, EMS arrived. Pt with noted LLN BPs. IVFs started.  On presentation, tmax 99.9, HR 80s-110s. BP 123456 systolic. Satting > 92% on RA. WBC 16.6, hgb 13.2, Cr 1.13, Glu 127.  Initial trop and EKG WNL. CK 93.   Assessment and Plan:  [name] is a 73 y.o. year old female presenting with syncope   Syncope: broad ddx, though higher concern for neurocardiogenic sources. Check MRI brain and 2D ECHO. Cycle CEs. Cards consult. Hydrate pt. Orthostatics. Hold BP meds.   Leukocytosis: ? Reactive in setting of presentation vs. True infectious etiology. Noted spontaneous NBNB vomiting and diarrhea x 1 today. Pan culture. Stool studies. Cipro and flagyl for GI ppx pending (borderline temp on presentation). F/u on UA.   CAD: As above. Baby ASA. Cycle CEs. Cards consult. Hold BP meds. F/u on recs.    FEN/GI: NPO for now.  Prophylaxis: sub q heparin  Disposition: pending further evaluation  Code Status:Full Code    Patient Active Problem List   Diagnosis Date Noted  . Syncope 10/05/2013  . Near syncope 12/28/2012  . Dementia 12/06/2012   . Chest pain at rest 10/28/2012  . Anxiety 10/28/2012    Class: Acute  . Edema of both legs 10/28/2012  . CAD S/P percutaneous coronary angioplasty   . Hypertension, essential, benign   . Dyslipidemia, goal LDL below 70    Past Medical History: Past Medical History  Diagnosis Date  . CAD S/P percutaneous coronary angioplasty 2007; March 2011    [facility] 3.0 mm 20 mm postdilated 3.6 mm in early mid LAD; status post ISR Cutting Balloon PTCA and March 11 along with PCI of distal mid lesion with a 3.0 mm 12 mm MultiLink vision BMS; the proximal stent causes jailing of SP1 and SP2 with ostial 70-80% lesions  . Diabetes mellitus type 2 with neurological manifestations   . Peripheral neuropathy   . Hypertension, essential, benign   . Dyslipidemia, goal LDL below 70   . Cancer      unspecified  . Arthritis      osteoarthritis  . Anxiety   . History of hematuria     Followed by Dr. Gaynelle Arabian    Past Surgical History: Past Surgical History  Procedure Laterality Date  . Abdominal hysterectomy    . Bladder suspension    . Appendectomy    . Cholecystectomy    . Nm myoview ltd  11/16/2011    5 beats a PVC during recovery.  No skin or infarction.  Reached 5 METs, EKG negative for ischemia   . Doppler echocardiography  12/31/2009    EF 55-60%, normal LV size and function, Grade 1 diastolic  dysfunction with mild TR  . Carotid doppler  06/24/2009    40-59% R ICA stenosis. No significant ICA stenosis noted  . Cardiac catheterization  02/24/2006    85% stenosis in the proximal portion of LAD-arrangements made for PCI on 02/25/2006  . Cardiac catheterization  02/25/2006    80% LAD lesion stented with a 3x8mm [facility] stent resulting in reduction of 80% lesion to 0% residual  . Cardiac catheterization  04/26/2006    Medical management  . Cardiac catheterization  06/24/2009    60-70% re-stenosis in the proximal LAD. A 3.25x15 cutting balloon, 3 inflations - 14atm-38sec, 13atm-39sec, and  12atm-40sec reduced to less than 10%. 60% stenosis of the mid/distal LAD stented with a 3x16mm Multilink stent.  . Cardiac catheterization  07/09/2009    Medical management    Social History: History   Social History  . Marital Status: Married    Spouse Name: N/A    Number of Children: N/A  . Years of Education: N/A   Social History Main Topics  . Smoking status: Never Smoker   . Smokeless tobacco: None  . Alcohol Use: No  . Drug Use: None  . Sexual Activity: None   Other Topics Concern  . None   Social History Narrative   She is a married mother of 53, grandmother of 2.  Usually very active and out about the house.  But currently over last 6-8 weeks has been incapacitated.  She never smoked and does not drink alcohol.    Family History: Family History  Problem Relation Age of Onset  . Diabetes Mother   . Hypertension Mother   . Kidney disease Mother   . Hypertension Father   . Emphysema Father   . Heart attack Father   . Heart disease Father   . Arthritis Sister   . Diabetes Sister   . Hypertension Sister   . Hypertension Brother   . Hyperlipidemia Brother   . Diabetes Brother   . Stroke Brother   . Diabetes Sister   . Hypertension Sister   . Cancer Sister     Cervical cancer  . Hypertension Brother   . Heart attack Daughter   . Hypertension Daughter   . COPD Daughter   . Cancer Daughter     Breast cancer  . Kidney disease Daughter     Kidney mass    Allergies: Allergies  Allergen Reactions  . Lipitor [Atorvastatin] Other (See Comments)    Bone and muscle pain  . Nsaids Swelling  . Reglan [Metoclopramide]     Tardive dyskinesia; paradoxical reaction not relieved by benadryl  . Ultram [Tramadol] Other (See Comments)    Pt has seizure activity with this medication    Current Facility-Administered Medications  Medication Dose Route Frequency Provider Last Rate Last Dose  . 0.9 %  sodium chloride infusion  1,000 mL Intravenous Once Janice Norrie, MD        Followed by  . 0.9 %  sodium chloride infusion  1,000 mL Intravenous Continuous Janice Norrie, MD      . 0.9 %  sodium chloride infusion   Intravenous Continuous Shanda Howells, MD      . aspirin EC tablet 81 mg  81 mg Oral Daily Shanda Howells, MD      . cholecalciferol (VITAMIN D) tablet 1,000 Units  1,000 Units Oral Daily Shanda Howells, MD      . clopidogrel (PLAVIX) tablet 75 mg  75 mg Oral Daily Shanda Howells, MD      .  Cod Liver Oil CAPS 1,000 mg  1,000 mg Oral Daily Shanda Howells, MD      . heparin injection 5,000 Units  5,000 Units Subcutaneous 3 times per day Shanda Howells, MD      . nitroGLYCERIN (NITROLINGUAL) 0.4 MG/SPRAY spray 1 spray  1 spray Sublingual Q5 min PRN Shanda Howells, MD      . sodium chloride 0.9 % injection 3 mL  3 mL Intravenous Q12H Shanda Howells, MD       Current Outpatient Prescriptions  Medication Sig Dispense Refill  . aspirin 81 MG EC tablet Take 81 mg by mouth daily. Swallow whole.      . bethanechol (URECHOLINE) 25 MG tablet Take 25 mg by mouth daily.       . bumetanide (BUMEX) 1 MG tablet Take 1.5 tablets (1.5 mg total) by mouth daily.  135 tablet  3  . Calcium Carb-Cholecalciferol (CALCIUM 600 + D PO) Take 1 tablet by mouth 2 (two) times daily. Chewable      . cholecalciferol (VITAMIN D) 1000 UNITS tablet Take 1,000 Units by mouth daily.       . clopidogrel (PLAVIX) 75 MG tablet Take 75 mg by mouth daily.      Marland Kitchen Cod Liver Oil 1000 MG CAPS Take 1,000 mg by mouth daily.      Marland Kitchen CRANBERRY-VITAMIN C PO Take 1 capsule by mouth 2 (two) times daily.      . diazepam (VALIUM) 5 MG tablet Take 2.5-5 mg by mouth 2 (two) times daily. Taking 2.5 mg in the morning and 5mg  at bedtime      . divalproex (DEPAKOTE) 500 MG DR tablet Take 500 mg by mouth daily.      Marland Kitchen estrogens, conjugated, (PREMARIN) 0.3 MG tablet Take 0.3 mg by mouth daily.       Marland Kitchen gabapentin (NEURONTIN) 100 MG capsule Take 100 mg by mouth at bedtime.      . isosorbide mononitrate (IMDUR) 60 MG 24 hr  tablet Take 60 mg by mouth daily.      Javier Docker Oil Omega-3 300 MG CAPS Take 300 mg by mouth daily.      Marland Kitchen losartan (COZAAR) 50 MG tablet Take 50 mg by mouth 2 (two) times daily.      . metFORMIN (GLUCOPHAGE) 500 MG tablet Take 500 mg by mouth 2 (two) times daily with a meal.      . Methylfol-Methylcob-Acetylcyst (CEREFOLIN NAC) 6-2-600 MG TABS Take 1 capsule by mouth daily. Take 1 tablet by mouth daily  90 each  3  . metoprolol succinate (TOPROL-XL) 25 MG 24 hr tablet Take 25 mg by mouth at bedtime.      . metroNIDAZOLE (METROCREAM) 0.75 % cream Apply 1 application topically 2 (two) times daily.       . Multiple Vitamins-Minerals (HAIR/SKIN/NAILS PO) Take 1 capsule by mouth every morning.      . niacin (NIASPAN) 500 MG CR tablet Take 500 mg by mouth at bedtime.      . nitroGLYCERIN (NITROLINGUAL) 0.4 MG/SPRAY spray Place 1 spray under the tongue every 5 (five) minutes as needed for chest pain.      . Omega-3 Fatty Acids (FISH OIL) 1200 MG CAPS Take 1,200 mg by mouth 2 (two) times daily.      . pantoprazole (PROTONIX) 40 MG tablet Take 1 tablet (40 mg total) by mouth daily.  90 tablet  3  . pramipexole (MIRAPEX) 0.25 MG tablet Take 0.25 mg by mouth at bedtime.      Marland Kitchen  rosuvastatin (CRESTOR) 10 MG tablet Take 10 mg by mouth every evening.       . urea (CARMOL) 40 % CREA Apply 1 application topically daily as needed (for cracked heals). prn       Review Of Systems: 12 point ROS negative except as noted above in HPI.  Physical Exam: Filed Vitals:   10/05/13 0000  BP: 136/77  Pulse: 113  Temp:   Resp: 20    General: somnolent, cooperative to exam  HEENT: PERRLA and extra ocular movement intact Heart: S1, S2 normal, no murmur, rub or gallop, regular rate and rhythm Lungs: clear to auscultation, no wheezes or rales and unlabored breathing Abdomen: abdomen is soft without significant tenderness, masses, organomegaly or guarding Extremities: extremities normal, atraumatic, no cyanosis or  edema Skin:no rashes, no ecchymoses Neurology: normal without focal findings  Labs and Imaging: Lab Results  Component Value Date/Time   NA 140 10/04/2013  9:45 PM   K 4.5 10/04/2013  9:45 PM   CL 96 10/04/2013  9:45 PM   CO2 27 10/04/2013  9:45 PM   BUN 22 10/04/2013  9:45 PM   CREATININE 1.13* 10/04/2013  9:45 PM   CREATININE 0.94 01/09/2013  8:39 AM   GLUCOSE 127* 10/04/2013  9:45 PM   Lab Results  Component Value Date   WBC 16.6* 10/04/2013   HGB 13.2 10/04/2013   HCT 39.6 10/04/2013   MCV 85.7 10/04/2013   PLT 226 10/04/2013   Urinalysis  No results found.         Shanda Howells MD  Pager: [phone]

## 2013-10-05 NOTE — Progress Notes (Signed)
  Echocardiogram 2D Echocardiogram has been performed.  [name] 10/05/2013, 9:41 AM

## 2013-10-05 NOTE — ED Notes (Signed)
Called report to Rashida, Therapist, sports.

## 2013-10-05 NOTE — Progress Notes (Signed)
Patient transferred from ED via stretcher to room [phone]. Patient oriented to room and call bell system. Telemetry monitor applied to patient and nurse tech notified CMT that she is on telemetry and connected. Patient is drowsy but alert. Prior to arriving, patient received ativan and has made her sleepy but patient is alert and able to answer questions appropriately. Family at the bedside. Patient complains of no pain or discomfort when asked. Will continue to monitor patient to end of shift.

## 2013-10-06 DIAGNOSIS — R197 Diarrhea, unspecified: Secondary | ICD-10-CM

## 2013-10-06 DIAGNOSIS — R55 Syncope and collapse: Secondary | ICD-10-CM | POA: Diagnosis not present

## 2013-10-06 DIAGNOSIS — R112 Nausea with vomiting, unspecified: Secondary | ICD-10-CM

## 2013-10-06 DIAGNOSIS — K219 Gastro-esophageal reflux disease without esophagitis: Secondary | ICD-10-CM

## 2013-10-06 LAB — CLOSTRIDIUM DIFFICILE BY PCR: Toxigenic C. Difficile by PCR: NEGATIVE

## 2013-10-06 LAB — CBC WITH DIFFERENTIAL/PLATELET
Basophils Absolute: 0 10*3/uL (ref 0.0–0.1)
Basophils Relative: 0 % (ref 0–1)
Eosinophils Absolute: 0.2 10*3/uL (ref 0.0–0.7)
Eosinophils Relative: 3 % (ref 0–5)
HCT: 31.1 % — ABNORMAL LOW (ref 36.0–46.0)
Hemoglobin: 10 g/dL — ABNORMAL LOW (ref 12.0–15.0)
Lymphocytes Relative: 32 % (ref 12–46)
Lymphs Abs: 2.1 10*3/uL (ref 0.7–4.0)
MCH: 27.6 pg (ref 26.0–34.0)
MCHC: 32.2 g/dL (ref 30.0–36.0)
MCV: 85.9 fL (ref 78.0–100.0)
Monocytes Absolute: 0.5 10*3/uL (ref 0.1–1.0)
Monocytes Relative: 8 % (ref 3–12)
Neutro Abs: 3.8 10*3/uL (ref 1.7–7.7)
Neutrophils Relative %: 57 % (ref 43–77)
Platelets: 185 10*3/uL (ref 150–400)
RBC: 3.62 MIL/uL — ABNORMAL LOW (ref 3.87–5.11)
RDW: 15.3 % (ref 11.5–15.5)
WBC: 6.7 10*3/uL (ref 4.0–10.5)

## 2013-10-06 LAB — URINE CULTURE
Colony Count: NO GROWTH
Culture: NO GROWTH

## 2013-10-06 LAB — FECAL LACTOFERRIN, QUANT: Fecal Lactoferrin: POSITIVE

## 2013-10-06 LAB — GLUCOSE, CAPILLARY
Glucose-Capillary: 91 mg/dL (ref 70–99)
Glucose-Capillary: 104 mg/dL — ABNORMAL HIGH (ref 70–99)
Glucose-Capillary: 135 mg/dL — ABNORMAL HIGH (ref 70–99)

## 2013-10-06 LAB — COMPREHENSIVE METABOLIC PANEL
ALT: 7 U/L (ref 0–35)
AST: 15 U/L (ref 0–37)
Albumin: 2.6 g/dL — ABNORMAL LOW (ref 3.5–5.2)
Alkaline Phosphatase: 47 U/L (ref 39–117)
Anion gap: 12 (ref 5–15)
BUN: 11 mg/dL (ref 6–23)
CO2: 25 mEq/L (ref 19–32)
Calcium: 8.5 mg/dL (ref 8.4–10.5)
Chloride: 99 mEq/L (ref 96–112)
Creatinine, Ser: 0.99 mg/dL (ref 0.50–1.10)
GFR calc Af Amer: 63 mL/min — ABNORMAL LOW (ref >90)
GFR calc non Af Amer: 54 mL/min — ABNORMAL LOW (ref >90)
Glucose, Bld: 97 mg/dL (ref 70–99)
Potassium: 4.1 mEq/L (ref 3.7–5.3)
Sodium: 136 mEq/L — ABNORMAL LOW (ref 137–147)
Total Bilirubin: <0.2 mg/dL — ABNORMAL LOW (ref 0.3–1.2)
Total Protein: 5.5 g/dL — ABNORMAL LOW (ref 6.0–8.3)

## 2013-10-06 MED ORDER — ACETAMINOPHEN 325 MG PO TABS
650.0000 mg | ORAL_TABLET | Freq: Four times a day (QID) | ORAL | Status: DC | PRN
  Administered 2013-10-06: 650 mg via ORAL
  Filled 2013-10-06: qty 2

## 2013-10-06 MED ORDER — PRAMIPEXOLE DIHYDROCHLORIDE 0.25 MG PO TABS
0.2500 mg | ORAL_TABLET | Freq: Once | ORAL | Status: AC
  Administered 2013-10-06: 0.25 mg via ORAL
  Filled 2013-10-06 (×2): qty 1

## 2013-10-06 MED ORDER — METOPROLOL SUCCINATE ER 25 MG PO TB24
25.0000 mg | ORAL_TABLET | Freq: Every day | ORAL | Status: DC
  Administered 2013-10-06: 25 mg via ORAL
  Filled 2013-10-06 (×2): qty 1

## 2013-10-06 NOTE — Evaluation (Signed)
Physical Therapy Evaluation Patient Details Name: [name] MRN: [id number] DOB: [birth year] Today's Date: 10/06/2013   History of Present Illness  Patient is a 73 yo female admitted 10/04/13 after N/V, diarrhea, and syncopal episode.  Patient with chest pain and hypotension.  PMH:  CAD s/p stenting, HTN, dementia, CKD, DM, peripheral neuropathy, HTN.  Clinical Impression  Patient is independent with mobility and supervision for gait for safety only.  No acute PT needs identified - PT will sign off.  Encouraged patient to ambulate in hallway with husband/nursing.    Follow Up Recommendations No PT follow up;Supervision/Assistance - 24 hour    Equipment Recommendations  None recommended by PT    Recommendations for Other Services       Precautions / Restrictions Precautions Precautions: None Restrictions Weight Bearing Restrictions: No      Mobility  Bed Mobility Overal bed mobility: Independent                Transfers Overall transfer level: Needs assistance Equipment used: None Transfers: Sit to/from Stand Sit to Stand: Supervision         General transfer comment: Supervision for safety only.  Ambulation/Gait Ambulation/Gait assistance: Supervision Ambulation Distance (Feet): 200 Feet Assistive device: None Gait Pattern/deviations: WFL(Within Functional Limits) Gait velocity: WFL Gait velocity interpretation: at or above normal speed for age/gender General Gait Details: Patient with good gait pattern, speed, and balance.  Stairs            Wheelchair Mobility    Modified Rankin (Stroke Patients Only)       Balance Overall balance assessment: No apparent balance deficits (not formally assessed)                                           Pertinent Vitals/Pain     Home Living Family/patient expects to be discharged to:: Private residence Living Arrangements: Spouse/significant other Available Help at Discharge:  Family;Available 24 hours/day Type of Home: House Home Access: Stairs to enter Entrance Stairs-Rails: Psychiatric nurse of Steps: 3 Home Layout: One level Home Equipment: None      Prior Function Level of Independence: Independent               Hand Dominance   Dominant Hand: Right    Extremity/Trunk Assessment   Upper Extremity Assessment: Overall WFL for tasks assessed           Lower Extremity Assessment: Overall WFL for tasks assessed      Cervical / Trunk Assessment: Normal  Communication   Communication: No difficulties  Cognition Arousal/Alertness: Awake/alert Behavior During Therapy: WFL for tasks assessed/performed Overall Cognitive Status: Within Functional Limits for tasks assessed                      General Comments      Exercises        Assessment/Plan    PT Assessment Patent does not need any further PT services  PT Diagnosis     PT Problem List    PT Treatment Interventions     PT Goals (Current goals can be found in the Care Plan section) Acute Rehab PT Goals PT Goal Formulation: No goals set, d/c therapy    Frequency     Barriers to discharge        Co-evaluation  End of Session Equipment Utilized During Treatment: Gait belt Activity Tolerance: Patient tolerated treatment well Patient left: in bed;with call bell/phone within reach;with family/visitor present Nurse Communication: Mobility status         Time: 1650-1730 PT Time Calculation (min): 40 min   Charges:   PT Evaluation $Initial PT Evaluation Tier I: 1 Procedure PT Treatments $Gait Training: 23-37 mins   PT G Codes:          Despina Pole 10/06/2013, 5:43 PM Carita Pian. Sanjuana Kava, [place] Pager [phone]

## 2013-10-06 NOTE — Progress Notes (Signed)
Patient ID: FISHER WIECHMANN, female   DOB: Jun 20, 1940, 73 y.o.   MRN: [id number]  SUBJECTIVE:  I'm trying to feel better  OBJECTIVE:   Vitals:   Filed Vitals:   10/05/13 0900 10/05/13 1500 10/05/13 2100 10/06/13 0527  BP:  147/72  132/71  Pulse:  86  74  Temp: 97.7 F (36.5 C) 97.8 F (36.6 C) 98.1 F (36.7 C) 97.8 F (36.6 C)  TempSrc:  Oral Oral Oral  Resp:  18 18 18   Height:      Weight:      SpO2:  97% 97% 96%   I&O's:    Intake/Output Summary (Last 24 hours) at 10/06/13 0801 Last data filed at 10/06/13 0630  Gross per 24 hour  Intake    960 ml  Output   3128 ml  Net  -2168 ml   TELEMETRY: Reviewed telemetry pt in NSR:     PHYSICAL EXAM General: Well developed, well nourished, in no acute distress Head: Eyes PERRLA, No xanthomas.   Normal cephalic and atramatic  Lungs:   Clear bilaterally to auscultation and percussion. Heart:   HRRR S1 S2 Pulses are 2+ & equal. Abdomen: Bowel sounds are positive, abdomen soft and non-tender without masses  Extremities:   No clubbing, cyanosis or edema.  DP +1 Neuro: Alert and oriented X 3. Psych:  Good affect, responds appropriately   LABS: Basic Metabolic Panel:  Recent Labs  10/05/13 0430 10/06/13 0330  NA 139 136*  K 4.1 4.1  CL 100 99  CO2 24 25  GLUCOSE 123* 97  BUN 18 11  CREATININE 1.00 0.99  CALCIUM 8.3* 8.5   Liver Function Tests:  Recent Labs  10/05/13 0430 10/06/13 0330  AST 13 15  ALT 7 7  ALKPHOS 50 47  BILITOT <0.2* <0.2*  PROT 5.4* 5.5*  ALBUMIN 2.6* 2.6*   CBC:  Recent Labs  10/05/13 0430 10/06/13 0330  WBC 9.8 6.7  NEUTROABS 8.6* 3.8  HGB 10.9* 10.0*  HCT 33.4* 31.1*  MCV 84.6 85.9  PLT 208 185   Cardiac Enzymes:  Recent Labs  10/04/13 2145 10/05/13 0107 10/05/13 0430 10/05/13 1235  CKTOTAL 93  --   --   --   TROPONINI <0.30 <0.30 <0.30 <0.30   Hemoglobin A1C:  Recent Labs  10/05/13 0107  HGBA1C 5.9*   Thyroid Function Tests:  Recent Labs  10/05/13 0107  TSH  1.920   Coag Panel:   Lab Results  Component Value Date   INR 0.96 09/26/2009   INR 1.04 07/08/2009   INR 0.94 06/23/2009    RADIOLOGY: No results found.  Assessment and Plan:  Chest pain- atypical and chronic in pt with hx 1 vessel CAD s/p PCI of LAD in 2011 and nl stress test on 2013  Syncope - orthostasis and dehydration Echo is normal Telemetry normal No further cardiac w/u  Outpatient f/u with [name]   Will sign off     Jenkins Rouge, MD  10/06/2013  8:01 AM

## 2013-10-06 NOTE — Progress Notes (Signed)
[facility] PROGRESS NOTE  DEA SOUFFRANT [id number] DOB: 1940-10-17 DOA: 10/04/2013 PCP: Glo Herring., MD  Assessment/Plan:  Active Problems:   Syncope - Most likely related to hypovolemia from exertion (pt mowing lawn) as well as nausea and diarrhea which were made worse when patient took nitroglycerin.   - resolved and Orthostatic vital signs within normal limits - PT to evaluate  GERD - Most likely cause of chest discomfort - Have discussed proper use of GERD medicine at home  Chest pain - Cardiology has signed off and currently they are not recommending any further cardiac workup. -Troponins negative x3  Nausea vomiting and diarrhea - Based on history I am suspecting a viral gastroenteritis. - Stool culture as well as ova and parasites evaluation of stool pending - Will obtain C. difficile PCR - Given that index of suspicion is more related to viral gastroenteritis will discontinue antibiotics at this juncture, but should patient spike fever her white blood cell count trending up we'll consider continuing antibiotic coverage  Code Status: Full Family Communication: Discussed with spouse and patient at bedside Disposition Plan: Awaiting further workup   Consultants:  Cardiology  Procedures:  none  Antibiotics:  Cipro and flagyl>>>7/11  HPI/Subjective: Pt has no new complaints. No acute issues reported overnight.  Objective: Filed Vitals:   10/06/13 0527  BP: 132/71  Pulse: 74  Temp: 97.8 F (36.6 C)  Resp: 18    Intake/Output Summary (Last 24 hours) at 10/06/13 1200 Last data filed at 10/06/13 1000  Gross per 24 hour  Intake    603 ml  Output   3127 ml  Net  -2524 ml   Filed Weights   10/04/13 2048 10/05/13 0151 10/05/13 0532  Weight: 77.565 kg (171 lb) 79.878 kg (176 lb 1.6 oz) 76.885 kg (169 lb 8 oz)    Exam:   General:  Pt in NAD, alert and awake  Cardiovascular: RRR, no MRG  Respiratory: CTA BL, no wheezes  Abdomen: soft,  ND  Musculoskeletal: no cyanosis or clubbing   Data Reviewed: Basic Metabolic Panel:  Recent Labs Lab 10/04/13 2145 10/05/13 0107 10/05/13 0430 10/06/13 0330  NA 140  --  139 136*  K 4.5  --  4.1 4.1  CL 96  --  100 99  CO2 27  --  24 25  GLUCOSE 127*  --  123* 97  BUN 22  --  18 11  CREATININE 1.13* 1.07 1.00 0.99  CALCIUM 9.6  --  8.3* 8.5   Liver Function Tests:  Recent Labs Lab 10/04/13 2145 10/05/13 0430 10/06/13 0330  AST 15 13 15   ALT 8 7 7   ALKPHOS 68 50 47  BILITOT 0.3 <0.2* <0.2*  PROT 7.2 5.4* 5.5*  ALBUMIN 3.5 2.6* 2.6*   No results found for this basename: LIPASE, AMYLASE,  in the last 168 hours No results found for this basename: AMMONIA,  in the last 168 hours CBC:  Recent Labs Lab 10/04/13 2145 10/05/13 0107 10/05/13 0430 10/06/13 0330  WBC 16.6* 12.0* 9.8 6.7  NEUTROABS 14.7*  --  8.6* 3.8  HGB 13.2 11.9* 10.9* 10.0*  HCT 39.6 36.0 33.4* 31.1*  MCV 85.7 84.5 84.6 85.9  PLT 226 210 208 185   Cardiac Enzymes:  Recent Labs Lab 10/04/13 2145 10/05/13 0107 10/05/13 0430 10/05/13 1235  CKTOTAL 93  --   --   --   TROPONINI <0.30 <0.30 <0.30 <0.30   BNP (last 3 results)  Recent Labs  10/05/13 0107  PROBNP 262.9   CBG:  Recent Labs Lab 10/05/13 1145 10/05/13 1721 10/05/13 2106 10/06/13 0753 10/06/13 1146  GLUCAP 105* 128* 117* 91 104*    Recent Results (from the past 240 hour(s))  URINE CULTURE     Status: None   Collection Time    10/05/13 12:59 AM      Result Value Ref Range Status   Specimen Description URINE, CATHETERIZED   Final   Special Requests NONE   Final   Culture  Setup Time     Final   Value: 10/05/2013 09:15     Performed at [place]     Final   Value: NO GROWTH     Performed at [facility]   Culture     Final   Value: NO GROWTH     Performed at [facility]   Report Status 10/06/2013 FINAL   Final     Studies: Mr Brain Wo Contrast  10/05/2013    CLINICAL DATA:  Dementia.  Syncopal episode.  EXAM: MRI HEAD WITHOUT CONTRAST  TECHNIQUE: Multiplanar, multiecho pulse sequences of the brain and surrounding structures were obtained without intravenous contrast.  COMPARISON:  Head CT 11/21/2009.  MRI 09/26/2009.  FINDINGS: Diffusion imaging does not show any acute or subacute infarction. There are chronic small-vessel ischemic changes throughout the pons. No focal cerebellar insult. The cerebral hemispheres show moderate chronic small vessel ischemic changes affecting the deep and subcortical white matter. No cortical or large vessel territory infarction. No mass lesion, hemorrhage, hydrocephalus or extra-axial collection. No pituitary mass. No inflammatory sinus disease. No skull or skullbase lesion.  IMPRESSION: No acute or reversible finding. Moderate chronic appearing small vessel ischemic changes affecting the pons in the cerebral hemispheric white matter. This is slightly progressive since 2011.   Electronically Signed   By: Nelson Chimes M.D.   On: 10/05/2013 08:35    Scheduled Meds: . aspirin EC  81 mg Oral Daily  . cholecalciferol  1,000 Units Oral Daily  . ciprofloxacin  400 mg Intravenous Q12H  . clopidogrel  75 mg Oral Daily  . heparin  5,000 Units Subcutaneous 3 times per day  . metronidazole  500 mg Intravenous Q8H  . omega-3 acid ethyl esters  1,000 mg Oral Daily  . sodium chloride  3 mL Intravenous Q12H   Continuous Infusions:    Time spent: > 35 minutes    Velvet Bathe  [facility] Pager [phone]. If 7PM-7AM, please contact night-coverage at [email or web address], password [email or web address] 10/06/2013, 12:00 PM  LOS: 2 days

## 2013-10-07 DIAGNOSIS — R55 Syncope and collapse: Secondary | ICD-10-CM | POA: Diagnosis not present

## 2013-10-07 LAB — COMPREHENSIVE METABOLIC PANEL
ALT: 11 U/L (ref 0–35)
AST: 22 U/L (ref 0–37)
Albumin: 3 g/dL — ABNORMAL LOW (ref 3.5–5.2)
Alkaline Phosphatase: 56 U/L (ref 39–117)
Anion gap: 15 (ref 5–15)
BUN: 8 mg/dL (ref 6–23)
CO2: 25 mEq/L (ref 19–32)
Calcium: 9.1 mg/dL (ref 8.4–10.5)
Chloride: 101 mEq/L (ref 96–112)
Creatinine, Ser: 0.88 mg/dL (ref 0.50–1.10)
GFR calc Af Amer: 73 mL/min — ABNORMAL LOW (ref >90)
GFR calc non Af Amer: 63 mL/min — ABNORMAL LOW (ref >90)
Glucose, Bld: 97 mg/dL (ref 70–99)
Potassium: 4 mEq/L (ref 3.7–5.3)
Sodium: 141 mEq/L (ref 137–147)
Total Bilirubin: <0.2 mg/dL — ABNORMAL LOW (ref 0.3–1.2)
Total Protein: 6.4 g/dL (ref 6.0–8.3)

## 2013-10-07 LAB — CBC WITH DIFFERENTIAL/PLATELET
Basophils Absolute: 0 10*3/uL (ref 0.0–0.1)
Basophils Relative: 0 % (ref 0–1)
Eosinophils Absolute: 0.2 10*3/uL (ref 0.0–0.7)
Eosinophils Relative: 3 % (ref 0–5)
HCT: 34.5 % — ABNORMAL LOW (ref 36.0–46.0)
Hemoglobin: 11.1 g/dL — ABNORMAL LOW (ref 12.0–15.0)
Lymphocytes Relative: 32 % (ref 12–46)
Lymphs Abs: 1.9 10*3/uL (ref 0.7–4.0)
MCH: 27.4 pg (ref 26.0–34.0)
MCHC: 32.2 g/dL (ref 30.0–36.0)
MCV: 85.2 fL (ref 78.0–100.0)
Monocytes Absolute: 0.5 10*3/uL (ref 0.1–1.0)
Monocytes Relative: 8 % (ref 3–12)
Neutro Abs: 3.3 10*3/uL (ref 1.7–7.7)
Neutrophils Relative %: 57 % (ref 43–77)
Platelets: 223 10*3/uL (ref 150–400)
RBC: 4.05 MIL/uL (ref 3.87–5.11)
RDW: 15.4 % (ref 11.5–15.5)
WBC: 5.8 10*3/uL (ref 4.0–10.5)

## 2013-10-07 LAB — GLUCOSE, CAPILLARY
Glucose-Capillary: 109 mg/dL — ABNORMAL HIGH (ref 70–99)
Glucose-Capillary: 91 mg/dL (ref 70–99)
Glucose-Capillary: 103 mg/dL — ABNORMAL HIGH (ref 70–99)

## 2013-10-07 MED ORDER — LOSARTAN POTASSIUM 25 MG PO TABS: 25.0000 mg | ORAL_TABLET | Freq: Every day | ORAL | Status: DC

## 2013-10-07 NOTE — Discharge Summary (Signed)
Physician Discharge Summary  [name] [id number] DOB: [birth year] DOA: 10/04/2013  PCP: Glo Herring., MD  Admit date: 10/04/2013 Discharge date: 10/07/2013  Time spent: > 35 minutes  Recommendations for Outpatient Follow-up:  1. Please be sure to f/u with your primary care physician or cardiologist 1-2 weeks after discharge 2. Bumex held due to presumed recent hypovolemia leading to syncope 3. Decreased ARB dose  Discharge Diagnoses:  Active Problems:   Chest pain at rest   Syncope   GERD (gastroesophageal reflux disease)   Nausea vomiting and diarrhea   Discharge Condition: stable  Diet recommendation: Diabetic diet  Filed Weights   10/05/13 0151 10/05/13 0532 10/07/13 0500  Weight: 79.878 kg (176 lb 1.6 oz) 76.885 kg (169 lb 8 oz) 78.926 kg (174 lb)    History of present illness:  From original history of present illness - 73 y.o. year old female with significant past medical history of CAD s/p stenting, HTN, dementia presenting with syncope. Per the family, pt mowed her yard yesterday, which is something that she normally does not do. Pt has uneventful night. However, pt woke up this morning with L sided/central CP. Pt took SL NTG with resolution of CP. Approx 1 hour later, pt had reported uncontrolled nausea and vomiting and generalized malaise.   Hospital Course:  Active Problems:   Syncope  - Most likely related to hypovolemia from exertion (pt mowing lawn) as well as nausea and diarrhea which were made worse when patient took nitroglycerin. Nausea/vomiting/diarrhea resolved. I suspect that patient's hypovolemia secondary to a viral gastroenteritis compounded by blood pressure medications and exertion the day prior to admission. - resolved and Orthostatic vital signs within normal limits  - PT to evaluated and did not recommend any PT f/u  GERD  - Most likely cause of chest discomfort  - Have discussed proper use of GERD medicine at home   Chest pain  -  Cardiology has signed off and currently they are not recommending any further cardiac workup.  -Troponins negative x3   Nausea vomiting and diarrhea  - Based on history I am suspecting a viral gastroenteritis.  - Stool culture as well as ova and parasites evaluation of stool pending  -C. difficile PCR negative - Given that index of suspicion is more related to viral gastroenteritis will discontinue antibiotics. Patient continued to improve off of antibiotics   Procedures:   echocardiogram: Reporting EF of 55-60%   Consultations:   cardiology   Discharge Exam: Filed Vitals:   10/07/13 0500  BP: 147/79  Pulse: 72  Temp: 97.9 F (36.6 C)  Resp: 16    General:  patient in no acute distress, alert and awake  Cardiovascular:  regular rate and rhythm, no murmurs or rubs  Respiratory:  clear to auscultation bilaterally   Discharge Instructions You were cared for by a hospitalist during your hospital stay. If you have any questions about your discharge medications or the care you received while you were in the hospital after you are discharged, you can call the unit and asked to speak with the hospitalist on call if the hospitalist that took care of you is not available. Once you are discharged, your primary care physician will handle any further medical issues. Please note that NO REFILLS for any discharge medications will be authorized once you are discharged, as it is imperative that you return to your primary care physician (or establish a relationship with a primary care physician if you do not have one) for  your aftercare needs so that they can reassess your need for medications and monitor your lab values.  Discharge Instructions   Call MD for:  persistant nausea and vomiting    Complete by:  As directed      Call MD for:  temperature >100.4    Complete by:  As directed      Diet - low sodium heart healthy    Complete by:  As directed      Discharge instructions    Complete  by:  As directed   Followup with cardiologist or your primary care physician to decide when to continue Bumex     Increase activity slowly    Complete by:  As directed             Medication List    STOP taking these medications       bumetanide 1 MG tablet  Commonly known as:  BUMEX      TAKE these medications       aspirin 81 MG EC tablet  Take 81 mg by mouth daily. Swallow whole.     bethanechol 25 MG tablet  Commonly known as:  URECHOLINE  Take 25 mg by mouth daily.     CALCIUM 600 + D PO  Take 1 tablet by mouth 2 (two) times daily. Chewable     CEREFOLIN NAC 6-2-600 MG Tabs  Take 1 capsule by mouth daily. Take 1 tablet by mouth daily     cholecalciferol 1000 UNITS tablet  Commonly known as:  VITAMIN D  Take 1,000 Units by mouth daily.     clopidogrel 75 MG tablet  Commonly known as:  PLAVIX  Take 75 mg by mouth daily.     Cod Liver Oil 1000 MG Caps  Take 1,000 mg by mouth daily.     CRANBERRY-VITAMIN C PO  Take 1 capsule by mouth 2 (two) times daily.     diazepam 5 MG tablet  Commonly known as:  VALIUM  Take 2.5-5 mg by mouth 2 (two) times daily. Taking 2.5 mg in the morning and 5mg  at bedtime     divalproex 500 MG DR tablet  Commonly known as:  DEPAKOTE  Take 500 mg by mouth daily.     estrogens (conjugated) 0.3 MG tablet  Commonly known as:  PREMARIN  Take 0.3 mg by mouth daily.     Fish Oil 1200 MG Caps  Take 1,200 mg by mouth 2 (two) times daily.     gabapentin 100 MG capsule  Commonly known as:  NEURONTIN  Take 100 mg by mouth at bedtime.     HAIR/SKIN/NAILS PO  Take 1 capsule by mouth every morning.     isosorbide mononitrate 60 MG 24 hr tablet  Commonly known as:  IMDUR  Take 60 mg by mouth daily.     Krill Oil Omega-3 300 MG Caps  Take 300 mg by mouth daily.     losartan 25 MG tablet  Commonly known as:  COZAAR  Take 1 tablet (25 mg total) by mouth daily.     metFORMIN 500 MG tablet  Commonly known as:  GLUCOPHAGE  Take 500  mg by mouth 2 (two) times daily with a meal.     metoprolol succinate 25 MG 24 hr tablet  Commonly known as:  TOPROL-XL  Take 25 mg by mouth at bedtime.     metroNIDAZOLE 0.75 % cream  Commonly known as:  METROCREAM  Apply 1 application topically 2 (two)  times daily.     niacin 500 MG CR tablet  Commonly known as:  NIASPAN  Take 500 mg by mouth at bedtime.     nitroGLYCERIN 0.4 MG/SPRAY spray  Commonly known as:  NITROLINGUAL  Place 1 spray under the tongue every 5 (five) minutes as needed for chest pain.     pantoprazole 40 MG tablet  Commonly known as:  PROTONIX  Take 1 tablet (40 mg total) by mouth daily.     pramipexole 0.25 MG tablet  Commonly known as:  MIRAPEX  Take 0.25 mg by mouth at bedtime.     rosuvastatin 10 MG tablet  Commonly known as:  CRESTOR  Take 10 mg by mouth every evening.     urea 40 % Crea  Commonly known as:  CARMOL  Apply 1 application topically daily as needed (for cracked heals). prn      ASK your doctor about these medications       FLAX SEED OIL PO  Take 1 tablet by mouth 2 (two) times daily.       Allergies  Allergen Reactions  . Lipitor [Atorvastatin] Other (See Comments)    Bone and muscle pain  . Nsaids Swelling  . Reglan [Metoclopramide]     Tardive dyskinesia; paradoxical reaction not relieved by benadryl  . Ultram [Tramadol] Other (See Comments)    Pt has seizure activity with this medication      The results of significant diagnostics from this hospitalization (including imaging, microbiology, ancillary and laboratory) are listed below for reference.    Significant Diagnostic Studies: Mr Brain Wo Contrast  10/05/2013   CLINICAL DATA:  Dementia.  Syncopal episode.  EXAM: MRI HEAD WITHOUT CONTRAST  TECHNIQUE: Multiplanar, multiecho pulse sequences of the brain and surrounding structures were obtained without intravenous contrast.  COMPARISON:  Head CT 11/21/2009.  MRI 09/26/2009.  FINDINGS: Diffusion imaging does not show  any acute or subacute infarction. There are chronic small-vessel ischemic changes throughout the pons. No focal cerebellar insult. The cerebral hemispheres show moderate chronic small vessel ischemic changes affecting the deep and subcortical white matter. No cortical or large vessel territory infarction. No mass lesion, hemorrhage, hydrocephalus or extra-axial collection. No pituitary mass. No inflammatory sinus disease. No skull or skullbase lesion.  IMPRESSION: No acute or reversible finding. Moderate chronic appearing small vessel ischemic changes affecting the pons in the cerebral hemispheric white matter. This is slightly progressive since 2011.   Electronically Signed   By: Nelson Chimes M.D.   On: 10/05/2013 08:35    Microbiology: Recent Results (from the past 240 hour(s))  URINE CULTURE     Status: None   Collection Time    10/05/13 12:59 AM      Result Value Ref Range Status   Specimen Description URINE, CATHETERIZED   Final   Special Requests NONE   Final   Culture  Setup Time     Final   Value: 10/05/2013 09:15     Performed at [facility]     Final   Value: NO GROWTH     Performed at [facility]   Culture     Final   Value: NO GROWTH     Performed at [facility]   Report Status 10/06/2013 FINAL   Final  STOOL CULTURE     Status: None   Collection Time    10/05/13  5:35 PM      Result Value Ref Range Status  Specimen Description STOOL   Final   Special Requests NONE   Final   Culture     Final   Value: Culture reincubated for better growth     Performed at [facility]   Report Status PENDING   Incomplete  CLOSTRIDIUM DIFFICILE BY PCR     Status: None   Collection Time    10/06/13  4:32 PM      Result Value Ref Range Status   C difficile by pcr NEGATIVE  NEGATIVE Final     Labs: Basic Metabolic Panel:  Recent Labs Lab 10/04/13 2145 10/05/13 0107 10/05/13 0430 10/06/13 0330 10/07/13 0526  NA 140  --  139 136*  141  K 4.5  --  4.1 4.1 4.0  CL 96  --  100 99 101  CO2 27  --  24 25 25   GLUCOSE 127*  --  123* 97 97  BUN 22  --  18 11 8   CREATININE 1.13* 1.07 1.00 0.99 0.88  CALCIUM 9.6  --  8.3* 8.5 9.1   Liver Function Tests:  Recent Labs Lab 10/04/13 2145 10/05/13 0430 10/06/13 0330 10/07/13 0526  AST 15 13 15 22   ALT 8 7 7 11   ALKPHOS 68 50 47 56  BILITOT 0.3 <0.2* <0.2* <0.2*  PROT 7.2 5.4* 5.5* 6.4  ALBUMIN 3.5 2.6* 2.6* 3.0*   No results found for this basename: LIPASE, AMYLASE,  in the last 168 hours No results found for this basename: AMMONIA,  in the last 168 hours CBC:  Recent Labs Lab 10/04/13 2145 10/05/13 0107 10/05/13 0430 10/06/13 0330 10/07/13 0526  WBC 16.6* 12.0* 9.8 6.7 5.8  NEUTROABS 14.7*  --  8.6* 3.8 3.3  HGB 13.2 11.9* 10.9* 10.0* 11.1*  HCT 39.6 36.0 33.4* 31.1* 34.5*  MCV 85.7 84.5 84.6 85.9 85.2  PLT 226 210 208 185 223   Cardiac Enzymes:  Recent Labs Lab 10/04/13 2145 10/05/13 0107 10/05/13 0430 10/05/13 1235  CKTOTAL 93  --   --   --   TROPONINI <0.30 <0.30 <0.30 <0.30   BNP: BNP (last 3 results)  Recent Labs  10/05/13 0107  PROBNP 262.9   CBG:  Recent Labs Lab 10/06/13 1146 10/06/13 1653 10/06/13 2016 10/07/13 0730 10/07/13 1134  GLUCAP 104* 135* 109* 91 103*       Signed:  Diesha Rostad  [facility] 10/07/2013, 2:01 PM

## 2013-10-08 LAB — OVA AND PARASITE EXAMINATION

## 2013-10-09 LAB — STOOL CULTURE

## 2013-10-10 DIAGNOSIS — N312 Flaccid neuropathic bladder, not elsewhere classified: Secondary | ICD-10-CM | POA: Diagnosis not present

## 2013-10-10 DIAGNOSIS — N182 Chronic kidney disease, stage 2 (mild): Secondary | ICD-10-CM | POA: Diagnosis not present

## 2013-10-12 ENCOUNTER — Other Ambulatory Visit (HOSPITAL_COMMUNITY): Payer: Self-pay | Admitting: Internal Medicine

## 2013-10-12 DIAGNOSIS — Z6827 Body mass index (BMI) 27.0-27.9, adult: Secondary | ICD-10-CM | POA: Diagnosis not present

## 2013-10-12 DIAGNOSIS — M899 Disorder of bone, unspecified: Secondary | ICD-10-CM

## 2013-10-12 DIAGNOSIS — R55 Syncope and collapse: Secondary | ICD-10-CM | POA: Diagnosis not present

## 2013-10-12 DIAGNOSIS — R079 Chest pain, unspecified: Secondary | ICD-10-CM | POA: Diagnosis not present

## 2013-10-12 DIAGNOSIS — M949 Disorder of cartilage, unspecified: Principal | ICD-10-CM

## 2013-10-12 DIAGNOSIS — I2510 Atherosclerotic heart disease of native coronary artery without angina pectoris: Secondary | ICD-10-CM | POA: Diagnosis not present

## 2013-10-18 ENCOUNTER — Other Ambulatory Visit (HOSPITAL_COMMUNITY): Payer: Medicare Other

## 2013-10-22 ENCOUNTER — Other Ambulatory Visit: Payer: Self-pay | Admitting: Cardiology

## 2013-10-24 ENCOUNTER — Ambulatory Visit (HOSPITAL_COMMUNITY)
Admission: RE | Admit: 2013-10-24 | Discharge: 2013-10-24 | Disposition: A | Discharge status: Home / Self Care | Payer: Medicare Other | Source: Ambulatory Visit | Attending: Internal Medicine | Admitting: Internal Medicine

## 2013-10-24 DIAGNOSIS — Z780 Asymptomatic menopausal state: Secondary | ICD-10-CM | POA: Diagnosis not present

## 2013-10-24 DIAGNOSIS — M949 Disorder of cartilage, unspecified: Principal | ICD-10-CM

## 2013-10-24 DIAGNOSIS — M899 Disorder of bone, unspecified: Secondary | ICD-10-CM | POA: Diagnosis not present

## 2013-10-24 DIAGNOSIS — I10 Essential (primary) hypertension: Secondary | ICD-10-CM | POA: Diagnosis not present

## 2013-10-30 DIAGNOSIS — M25519 Pain in unspecified shoulder: Secondary | ICD-10-CM | POA: Diagnosis not present

## 2013-10-30 DIAGNOSIS — M5030 Other cervical disc degeneration, unspecified cervical region: Secondary | ICD-10-CM | POA: Diagnosis not present

## 2013-11-01 DIAGNOSIS — E871 Hypo-osmolality and hyponatremia: Secondary | ICD-10-CM | POA: Diagnosis not present

## 2013-11-14 ENCOUNTER — Other Ambulatory Visit: Payer: Self-pay | Admitting: Cardiology

## 2013-11-14 NOTE — Telephone Encounter (Signed)
Rx was sent to pharmacy electronically. 

## 2013-11-18 ENCOUNTER — Other Ambulatory Visit: Payer: Self-pay | Admitting: Cardiology

## 2013-11-19 NOTE — Telephone Encounter (Signed)
Rx refill sent to patient pharmacy   

## 2013-11-20 DIAGNOSIS — M76899 Other specified enthesopathies of unspecified lower limb, excluding foot: Secondary | ICD-10-CM | POA: Diagnosis not present

## 2013-11-20 DIAGNOSIS — M75120 Complete rotator cuff tear or rupture of unspecified shoulder, not specified as traumatic: Secondary | ICD-10-CM | POA: Diagnosis not present

## 2013-11-26 DIAGNOSIS — M19019 Primary osteoarthritis, unspecified shoulder: Secondary | ICD-10-CM | POA: Diagnosis not present

## 2013-11-27 DIAGNOSIS — M76899 Other specified enthesopathies of unspecified lower limb, excluding foot: Secondary | ICD-10-CM | POA: Diagnosis not present

## 2013-12-01 ENCOUNTER — Other Ambulatory Visit: Payer: Self-pay | Admitting: Nurse Practitioner

## 2013-12-10 ENCOUNTER — Encounter: Payer: Self-pay | Admitting: Cardiology

## 2013-12-10 ENCOUNTER — Ambulatory Visit (INDEPENDENT_AMBULATORY_CARE_PROVIDER_SITE_OTHER): Payer: Medicare Other | Admitting: Cardiology

## 2013-12-10 VITALS — BP 156/87 | HR 87 | Ht 67.0 in | Wt 175.4 lb

## 2013-12-10 DIAGNOSIS — F411 Generalized anxiety disorder: Secondary | ICD-10-CM

## 2013-12-10 DIAGNOSIS — E785 Hyperlipidemia, unspecified: Secondary | ICD-10-CM | POA: Diagnosis not present

## 2013-12-10 DIAGNOSIS — Z01810 Encounter for preprocedural cardiovascular examination: Secondary | ICD-10-CM

## 2013-12-10 DIAGNOSIS — I2510 Atherosclerotic heart disease of native coronary artery without angina pectoris: Secondary | ICD-10-CM

## 2013-12-10 DIAGNOSIS — F419 Anxiety disorder, unspecified: Secondary | ICD-10-CM

## 2013-12-10 DIAGNOSIS — Z9861 Coronary angioplasty status: Secondary | ICD-10-CM | POA: Diagnosis not present

## 2013-12-10 DIAGNOSIS — R55 Syncope and collapse: Secondary | ICD-10-CM

## 2013-12-10 DIAGNOSIS — R609 Edema, unspecified: Secondary | ICD-10-CM | POA: Diagnosis not present

## 2013-12-10 DIAGNOSIS — I10 Essential (primary) hypertension: Secondary | ICD-10-CM

## 2013-12-10 DIAGNOSIS — R600 Localized edema: Secondary | ICD-10-CM

## 2013-12-10 NOTE — Patient Instructions (Signed)
CARDIAC CLEARANCE I FOR RIGHT SHOULDER SURGERY STOP PLAVIX 5-7 DAYS PRIOR TO SURGERY , TAKE ASPIRIN 81 MG  DAILY  WHILE OFF PLAVIX AFTER SURGERY STOP ASPIRIN.  Your physician recommends that you keep schedule  follow-up appointment in Dec 2015.

## 2013-12-11 ENCOUNTER — Encounter: Payer: Self-pay | Admitting: Cardiology

## 2013-12-11 DIAGNOSIS — Z01810 Encounter for preprocedural cardiovascular examination: Secondary | ICD-10-CM | POA: Insufficient documentation

## 2013-12-11 NOTE — Progress Notes (Signed)
PCP: Glo Herring., MD  Clinic Note: Chief Complaint  Patient presents with  . CARDIAC CLEARANCE    RIGHT SHOULDER SURGERY , no sob , no edema, no chest pain ( nothing new)   HPI: [name] is a 73 y.o. female with a PMH below who presents today for preop evaluation for shoulder surgery.  Past Medical History  Diagnosis Date  . CAD S/P percutaneous coronary angioplasty 2007; March 2011    [facility] 3.0 mm 20 mm postdilated 3.6 mm in early mid LAD; status post ISR [place] [employer] PTCA and March 11 along with PCI of distal mid lesion with a 3.0 mm 12 mm MultiLink vision BMS; the proximal stent causes jailing of SP1 and SP2 with ostial 70-80% lesions  . Diabetes mellitus type 2 with neurological manifestations   . Peripheral neuropathy   . Hypertension, essential, benign   . Dyslipidemia, goal LDL below 70   . Arthritis      osteoarthritis  . Anxiety   . History of hematuria     Followed by Dr. Gaynelle Arabian  . Stroke   . CHF (congestive heart failure)   . Dementia   . Chronic kidney disease   . Seizures   . Headache(784.0)   . Cancer     Melanoma, Squamous cell Carcinoma   Interval History: She presents today, really the first time without any major cardiac complaints. She had an episode not long ago where she was in the hospital for dehydration and syncope in July. As the setting of having taken her blood medications and then getting profoundly and nauseated with diarrhea. She was not eating and drinking very well and therefore the combination of dehydration and antihypertensive flow to profound hypotensive syncope. Her medications were reduced at that time. She really doesn't note any of her chest discomfort episodes mom for now. She still has occasional palpitations but nothing overly significant. She does get a little short of breath with walking, but nothing more than her baseline. Is low she has been doing her sliding scale Bumex dosing, her edema has been very well  controlled as has her orthopnea and PND.  No rapid or irregular heart rates or beats. No other episodes of syncope the except for the ones mentioned. Several of her antihypertensives were held during that timeframe.  ROS: A comprehensive Review of Systems - was performed Review of Systems  Constitutional: Negative.   HENT: Negative for nosebleeds.   Eyes: Negative for blurred vision and double vision.  Respiratory: Negative for cough, hemoptysis, sputum production, shortness of breath and wheezing.   Cardiovascular: Positive for palpitations and leg swelling. Negative for chest pain, orthopnea, claudication and PND.       Swelling stable on current dose of Bumex  Gastrointestinal: Negative for blood in stool and melena.  Genitourinary: Negative for frequency.  Skin: Positive for itching.  Neurological: Positive for dizziness. Negative for tingling, tremors, sensory change, speech change, focal weakness, seizures, loss of consciousness and headaches.       Recent episode 11-year-old full syncope in the setting of diarrhea, taking her blood pressure medication and diuretic leading to profound hypotension.  All other systems reviewed and are negative.  MEDICATIONS AND ALLERGIES REVIEWED IN EPIC -- no change SOCIAL AND FAMILY HISTORY REVIEWED IN EPIC -- no change PSH REVIEWED IN EPIC - NO Change  Wt Readings from Last 3 Encounters:  12/10/13 175 lb 6.4 oz (79.561 kg)  10/07/13 174 lb (78.926 kg)  09/18/13 172 lb  12.8 oz (78.382 kg)   PHYSICAL EXAM BP 156/87  Pulse 87  Ht 5\' 7"  (1.702 m)  Wt 175 lb 6.4 oz (79.561 kg)  BMI 27.47 kg/m2 Gen: alert, cooperative, appears stated age, no distress and less anxious She is well-groomed and well-nourished -- actually , she is in the spirits that are seen her in. Lungs: CTA B., normal percussion bilaterally and Nonlabored, good air movement  Heart: RRR, S1, S2 normal, no murmur, click, rub or gallop and normal apical impulse  Abdomen: Soft mildly  tender along the right flank there was a bruise. Nondistended, normal active bowel sounds. No organomegaly noted.  Extremities: Trace bilateral lower extremity and Left knee has bandages in place also her forearms as well.  Pulses: 2+ and symmetric  Neurologic: Mental status: Alert, oriented, thought content appropriate, affect: Remains labile  Cranial nerves: normal   Adult ECG Report - not checkedy  Recent Labs:  from July hospitalization reviewed in EPIC  ASSESSMENT / PLAN: Preoperative cardiovascular examination Absence of any active heart failure or chest pain symptoms, I don't see any reason to perform any additional evaluation prior to her proceeding with shoulder surgery which is relatively low risk surgery. She has been revascularized with no signs of ischemia. She had echocardiogram done that was relatively normal.  Shoulder surgery is relatively low risk. A C-spine proceeding. She would simply need to hold Plavix 5-7 days prior to the operation. It would be best if he could take the aspirin without Plavix perioperatively, then she can just stop the aspirin postop and go back to Plavix.  LOW RISK PATIENT for LOW RISK Patient - CAD, but revascularized& not active Sx, No listed history of stroke,  non-insulin-requiring diabetic, no CHF. Risk would be less than 1%. For abnormal meds.  Edema of both legs Control of Bumex a sliding scale. Further operation to simply continue standing dose once she is stable postoperatively.  Hypertension, essential, benign Not as well-controlled as I like. However I am okay with her systolics being in the [id number] range, provided the average numbers usually below that. She is taking losartan and Toprol only. Both doses could potentially be uptitrated.  Dyslipidemia, goal LDL below 70 During previous evaluation, she was really close to goal. Continue with current regimen. Would be nice to see how she fares now she is starting to get back into a more normal  eating habit.    No orders of the defined types were placed in this encounter.   No orders of the defined types were placed in this encounter.     Followup: As scheduled in December   Leonie Man, M.D., M.S. Interventional Cardiologist   Pager # [phone]

## 2013-12-11 NOTE — Assessment & Plan Note (Signed)
Control of Bumex a sliding scale. Further operation to simply continue standing dose once she is stable postoperatively.

## 2013-12-11 NOTE — Assessment & Plan Note (Signed)
Not as well-controlled as I like. However I am okay with her systolics being in the Q000111Q range, provided the average numbers usually below that. She is taking losartan and Toprol only. Both doses could potentially be uptitrated.

## 2013-12-11 NOTE — Assessment & Plan Note (Signed)
Absence of any active heart failure or chest pain symptoms, I don't see any reason to perform any additional evaluation prior to her proceeding with shoulder surgery which is relatively low risk surgery. She has been revascularized with no signs of ischemia. She had echocardiogram done that was relatively normal.  Shoulder surgery is relatively low risk. A C-spine proceeding. She would simply need to hold Plavix 5-7 days prior to the operation. It would be best if he could take the aspirin without Plavix perioperatively, then she can just stop the aspirin postop and go back to Plavix.  LOW RISK PATIENT for LOW RISK Patient - CAD, but revascularized& not active Sx, No listed history of stroke,  non-insulin-requiring diabetic, no CHF. Risk would be less than 1%. For abnormal meds.

## 2013-12-11 NOTE — Assessment & Plan Note (Signed)
During previous evaluation, she was really close to goal. Continue with current regimen. Would be nice to see how she fares now she is starting to get back into a more normal eating habit.

## 2013-12-24 DIAGNOSIS — M25819 Other specified joint disorders, unspecified shoulder: Secondary | ICD-10-CM | POA: Diagnosis not present

## 2013-12-24 DIAGNOSIS — M67919 Unspecified disorder of synovium and tendon, unspecified shoulder: Secondary | ICD-10-CM | POA: Diagnosis not present

## 2013-12-24 DIAGNOSIS — IMO0002 Reserved for concepts with insufficient information to code with codable children: Secondary | ICD-10-CM | POA: Diagnosis not present

## 2013-12-24 DIAGNOSIS — M19019 Primary osteoarthritis, unspecified shoulder: Secondary | ICD-10-CM | POA: Diagnosis not present

## 2013-12-24 DIAGNOSIS — M75100 Unspecified rotator cuff tear or rupture of unspecified shoulder, not specified as traumatic: Secondary | ICD-10-CM | POA: Diagnosis not present

## 2013-12-24 DIAGNOSIS — M7500 Adhesive capsulitis of unspecified shoulder: Secondary | ICD-10-CM | POA: Diagnosis not present

## 2013-12-24 DIAGNOSIS — M24019 Loose body in unspecified shoulder: Secondary | ICD-10-CM | POA: Diagnosis not present

## 2013-12-24 DIAGNOSIS — G8918 Other acute postprocedural pain: Secondary | ICD-10-CM | POA: Diagnosis not present

## 2013-12-27 HISTORY — PX: SHOULDER ARTHROSCOPY: SHX128

## 2013-12-28 DIAGNOSIS — Z4789 Encounter for other orthopedic aftercare: Secondary | ICD-10-CM | POA: Diagnosis not present

## 2013-12-29 ENCOUNTER — Emergency Department (HOSPITAL_COMMUNITY): Payer: Medicare Other

## 2013-12-29 ENCOUNTER — Emergency Department (HOSPITAL_COMMUNITY)
Admission: EM | Admit: 2013-12-29 | Discharge: 2013-12-29 | Disposition: A | Discharge status: Home / Self Care | Payer: Medicare Other | Attending: Emergency Medicine | Admitting: Emergency Medicine

## 2013-12-29 ENCOUNTER — Encounter (HOSPITAL_COMMUNITY): Payer: Self-pay | Admitting: Emergency Medicine

## 2013-12-29 DIAGNOSIS — F419 Anxiety disorder, unspecified: Secondary | ICD-10-CM | POA: Insufficient documentation

## 2013-12-29 DIAGNOSIS — E1149 Type 2 diabetes mellitus with other diabetic neurological complication: Secondary | ICD-10-CM | POA: Diagnosis not present

## 2013-12-29 DIAGNOSIS — E785 Hyperlipidemia, unspecified: Secondary | ICD-10-CM | POA: Insufficient documentation

## 2013-12-29 DIAGNOSIS — Z85820 Personal history of malignant melanoma of skin: Secondary | ICD-10-CM | POA: Insufficient documentation

## 2013-12-29 DIAGNOSIS — Z9889 Other specified postprocedural states: Secondary | ICD-10-CM | POA: Insufficient documentation

## 2013-12-29 DIAGNOSIS — R55 Syncope and collapse: Secondary | ICD-10-CM

## 2013-12-29 DIAGNOSIS — I509 Heart failure, unspecified: Secondary | ICD-10-CM | POA: Insufficient documentation

## 2013-12-29 DIAGNOSIS — I2510 Atherosclerotic heart disease of native coronary artery without angina pectoris: Secondary | ICD-10-CM | POA: Diagnosis not present

## 2013-12-29 DIAGNOSIS — R404 Transient alteration of awareness: Secondary | ICD-10-CM | POA: Diagnosis not present

## 2013-12-29 DIAGNOSIS — Z79899 Other long term (current) drug therapy: Secondary | ICD-10-CM | POA: Insufficient documentation

## 2013-12-29 DIAGNOSIS — Z87448 Personal history of other diseases of urinary system: Secondary | ICD-10-CM | POA: Diagnosis not present

## 2013-12-29 DIAGNOSIS — I129 Hypertensive chronic kidney disease with stage 1 through stage 4 chronic kidney disease, or unspecified chronic kidney disease: Secondary | ICD-10-CM | POA: Insufficient documentation

## 2013-12-29 DIAGNOSIS — F0390 Unspecified dementia without behavioral disturbance: Secondary | ICD-10-CM | POA: Insufficient documentation

## 2013-12-29 DIAGNOSIS — N189 Chronic kidney disease, unspecified: Secondary | ICD-10-CM | POA: Insufficient documentation

## 2013-12-29 DIAGNOSIS — M1990 Unspecified osteoarthritis, unspecified site: Secondary | ICD-10-CM | POA: Insufficient documentation

## 2013-12-29 DIAGNOSIS — I10 Essential (primary) hypertension: Secondary | ICD-10-CM | POA: Diagnosis not present

## 2013-12-29 LAB — CBC WITH DIFFERENTIAL/PLATELET
Basophils Absolute: 0 10*3/uL (ref 0.0–0.1)
Basophils Relative: 0 % (ref 0–1)
Eosinophils Absolute: 0.1 10*3/uL (ref 0.0–0.7)
Eosinophils Relative: 1 % (ref 0–5)
HCT: 32 % — ABNORMAL LOW (ref 36.0–46.0)
Hemoglobin: 10.4 g/dL — ABNORMAL LOW (ref 12.0–15.0)
Lymphocytes Relative: 13 % (ref 12–46)
Lymphs Abs: 1 10*3/uL (ref 0.7–4.0)
MCH: 27.7 pg (ref 26.0–34.0)
MCHC: 32.5 g/dL (ref 30.0–36.0)
MCV: 85.3 fL (ref 78.0–100.0)
Monocytes Absolute: 0.5 10*3/uL (ref 0.1–1.0)
Monocytes Relative: 7 % (ref 3–12)
Neutro Abs: 6.2 10*3/uL (ref 1.7–7.7)
Neutrophils Relative %: 79 % — ABNORMAL HIGH (ref 43–77)
Platelets: 240 10*3/uL (ref 150–400)
RBC: 3.75 MIL/uL — ABNORMAL LOW (ref 3.87–5.11)
RDW: 15.7 % — ABNORMAL HIGH (ref 11.5–15.5)
WBC: 7.9 10*3/uL (ref 4.0–10.5)

## 2013-12-29 LAB — D-DIMER, QUANTITATIVE (NOT AT ARMC): D-Dimer, Quant: 0.69 ug/mL-FEU — ABNORMAL HIGH (ref 0.00–0.48)

## 2013-12-29 LAB — I-STAT CHEM 8, ED
BUN: 21 mg/dL (ref 6–23)
Calcium, Ion: 1.12 mmol/L — ABNORMAL LOW (ref 1.13–1.30)
Chloride: 101 mEq/L (ref 96–112)
Creatinine, Ser: 1.2 mg/dL — ABNORMAL HIGH (ref 0.50–1.10)
Glucose, Bld: 120 mg/dL — ABNORMAL HIGH (ref 70–99)
HCT: 31 % — ABNORMAL LOW (ref 36.0–46.0)
Hemoglobin: 10.5 g/dL — ABNORMAL LOW (ref 12.0–15.0)
Potassium: 4.1 mEq/L (ref 3.7–5.3)
Sodium: 135 mEq/L — ABNORMAL LOW (ref 137–147)
TCO2: 29 mmol/L (ref 0–100)

## 2013-12-29 MED ORDER — TECHNETIUM TC 99M DIETHYLENETRIAME-PENTAACETIC ACID: 40.0000 | Freq: Once | INTRAVENOUS | Status: DC | PRN

## 2013-12-29 MED ORDER — SODIUM CHLORIDE 0.9 % IV BOLUS (SEPSIS)
500.0000 mL | Freq: Once | INTRAVENOUS | Status: AC
  Administered 2013-12-29: 500 mL via INTRAVENOUS

## 2013-12-29 MED ORDER — TECHNETIUM TO 99M ALBUMIN AGGREGATED
6.0000 | Freq: Once | INTRAVENOUS | Status: AC | PRN
  Administered 2013-12-29: 6 via INTRAVENOUS

## 2013-12-29 NOTE — ED Notes (Signed)
Pt ambulated to bathroom without difficulty -- no dizziness, steady gait

## 2013-12-29 NOTE — ED Notes (Signed)
Pt ambulated to restroom in POD A with steady gait, NAD, denied dizziness.

## 2013-12-29 NOTE — ED Notes (Signed)
Pt denies any c/o-- requesting breakfast.

## 2013-12-29 NOTE — ED Notes (Signed)
Eating breakfast-- phone call to husband to pick her up.

## 2013-12-29 NOTE — ED Notes (Signed)
Pt d/c'd from IV, monitor, continuous pulse oximetry and blood pressure cuff; pt getting dressed to be discharged home

## 2013-12-29 NOTE — ED Provider Notes (Addendum)
CSN: [id number]     Arrival date & time 12/29/13  0028 History   First MD Initiated Contact with Patient 12/29/13 0100     Chief Complaint  Patient presents with  . Loss of Consciousness     (Consider location/radiation/quality/duration/timing/severity/associated sxs/prior Treatment) HPI Comments: 73 y.o. year old female with significant past medical history of CAD s/p stenting, HTN, dementia,diastolic CHF presenting with syncope. Pt reports that she was sitting at the dining table, and started feeling liek she might pass out. She called for help, and her husband and son noticed that patient lost control of her body, and then passed out. Episode lasted for 10 seconds. Pt had no chest pain, palpitations, but she did get nauseated and felt sick prior to the episode. The episode lasted for 10 seconds, and pt was back to baseline. Pt deneis any PE, DVT - she is on estrogen. She had a syncopal episode few weeks ago, thought to be hypovolemia/orthostatic at that time. Pt was seen by Cards and had other studies - with no yield. She just had her shoulder operated on and was started on oxycodone, which pt feels caused the sx.  Patient is a 73 y.o. female presenting with syncope. The history is provided by the patient and medical records.  Loss of Consciousness Associated symptoms: nausea   Associated symptoms: no chest pain, no confusion, no seizures, no shortness of breath and no vomiting     Past Medical History  Diagnosis Date  . CAD S/P percutaneous coronary angioplasty 2007; March 2011    [facility] 3.0 mm 20 mm postdilated 3.6 mm in early mid LAD; status post ISR Cutting Balloon PTCA and March 11 along with PCI of distal mid lesion with a 3.0 mm 12 mm MultiLink vision BMS; the proximal stent causes jailing of SP1 and SP2 with ostial 70-80% lesions  . Diabetes mellitus type 2 with neurological manifestations   . Peripheral neuropathy   . Hypertension, essential, benign   . Dyslipidemia, goal  LDL below 70   . Arthritis      osteoarthritis  . Anxiety   . History of hematuria     Followed by Dr. Gaynelle Arabian  . Stroke   . CHF (congestive heart failure)   . Dementia   . Chronic kidney disease   . Seizures   . Headache(784.0)   . Cancer     Melanoma, Squamous cell Carcinoma   Past Surgical History  Procedure Laterality Date  . Abdominal hysterectomy    . Bladder suspension    . Appendectomy    . Cholecystectomy    . Nm myoview ltd  11/16/2011    5 beats a PVC during recovery.  No skin or infarction.  Reached 5 METs, EKG negative for ischemia   . Doppler echocardiography  12/31/2009/July 2015    IsLV size & function, Gr 1 DD w/ mild TR.;; b)09/2013: 55-60%. Grade 1 DD, mild aortic sclerosis  . Carotid doppler  06/24/2009    40-59% R ICA stenosis. No significant ICA stenosis noted  . Cardiac catheterization  02/24/2006    85% stenosis in the proximal portion of LAD-arrangements made for PCI on 02/25/2006  . Cardiac catheterization  02/25/2006    80% LAD lesion stented with a 3x20mm [facility] stent resulting in reduction of 80% lesion to 0% residual  . Cardiac catheterization  04/26/2006    Medical management  . Cardiac catheterization  06/24/2009    60-70% re-stenosis in the proximal LAD. A 3.25x15 cutting balloon,  3 inflations - 14atm-38sec, 13atm-39sec, and 12atm-40sec reduced to less than 10%. 60% stenosis of the mid/distal LAD stented with a 3x43mm Multilink stent.  . Cardiac catheterization  07/09/2009    Medical management   Family History  Problem Relation Age of Onset  . Diabetes Mother   . Hypertension Mother   . Kidney disease Mother   . Hypertension Father   . Emphysema Father   . Heart attack Father   . Heart disease Father   . Arthritis Sister   . Diabetes Sister   . Hypertension Sister   . Hypertension Brother   . Hyperlipidemia Brother   . Diabetes Brother   . Stroke Brother   . Diabetes Sister   . Hypertension Sister   . Cancer Sister     Cervical  cancer  . Hypertension Brother   . Heart attack Daughter   . Hypertension Daughter   . COPD Daughter   . Cancer Daughter     Breast cancer  . Kidney disease Daughter     Kidney mass   History  Substance Use Topics  . Smoking status: Never Smoker   . Smokeless tobacco: Not on file  . Alcohol Use: No   OB History   Grav Para Term Preterm Abortions TAB SAB Ect Mult Living                 Review of Systems  Constitutional: Negative for activity change and fatigue.  HENT: Negative for facial swelling.   Respiratory: Negative for cough, shortness of breath and wheezing.   Cardiovascular: Positive for syncope. Negative for chest pain.  Gastrointestinal: Positive for nausea. Negative for vomiting, abdominal pain, diarrhea, constipation, blood in stool and abdominal distention.  Genitourinary: Negative for hematuria and difficulty urinating.  Musculoskeletal: Negative for back pain and neck pain.  Skin: Negative for color change.  Neurological: Positive for syncope. Negative for seizures and speech difficulty.  Hematological: Does not bruise/bleed easily.  Psychiatric/Behavioral: Negative for confusion.      Allergies  Lipitor; Nsaids; Reglan; and Ultram  Home Medications   Prior to Admission medications   Medication Sig Start Date End Date Taking? Authorizing Provider  Calcium Carb-Cholecalciferol (CALCIUM 600 + D PO) Take 1 tablet by mouth 2 (two) times daily. Chewable   Yes Historical Provider, MD  cholecalciferol (VITAMIN D) 1000 UNITS tablet Take 1,000 Units by mouth daily.    Yes Historical Provider, MD  clopidogrel (PLAVIX) 75 MG tablet Take 75 mg by mouth daily.   Yes Historical Provider, MD  [facility] Liver Oil 1000 MG CAPS Take 1,000 mg by mouth daily.   Yes Historical Provider, MD  CRANBERRY-VITAMIN C PO Take 1 capsule by mouth daily.    Yes Historical Provider, MD  diazepam (VALIUM) 5 MG tablet Take 2.5-5 mg by mouth 2 (two) times daily. Taking 2.5 mg in the morning and  5mg  at bedtime   Yes Historical Provider, MD  diphenhydrAMINE (BENADRYL) 25 mg capsule Take 25 mg by mouth at bedtime as needed for sleep.   Yes Historical Provider, MD  divalproex (DEPAKOTE) 500 MG DR tablet Take 500 mg by mouth daily.   Yes Historical Provider, MD  estrogens, conjugated, (PREMARIN) 0.3 MG tablet Take 0.3 mg by mouth daily.    Yes Historical Provider, MD  gabapentin (NEURONTIN) 100 MG capsule Take 100 mg by mouth at bedtime.   Yes Historical Provider, MD  isosorbide mononitrate (IMDUR) 60 MG 24 hr tablet Take 60 mg by mouth See admin instructions. *  takes daily except for on wednesdays*   Yes Historical Provider, MD  Astrid Drafts Omega-3 300 MG CAPS Take 300 mg by mouth daily.   Yes Historical Provider, MD  losartan (COZAAR) 25 MG tablet Take 25 mg by mouth daily.   Yes Historical Provider, MD  metFORMIN (GLUCOPHAGE) 500 MG tablet Take 500 mg by mouth 2 (two) times daily with a meal.   Yes Historical Provider, MD  Methylfol-Methylcob-Acetylcyst (CEREFOLIN NAC) 6-2-600 MG TABS Take 1 tablet by mouth daily.   Yes Historical Provider, MD  metoprolol succinate (TOPROL-XL) 25 MG 24 hr tablet Take 25 mg by mouth at bedtime.   Yes Historical Provider, MD  metroNIDAZOLE (METROCREAM) 0.75 % cream Apply 1 application topically 2 (two) times daily.    Yes Historical Provider, MD  Multiple Vitamins-Minerals (HAIR/SKIN/NAILS PO) Take 1 capsule by mouth every morning.   Yes Historical Provider, MD  nitroGLYCERIN (NITROLINGUAL) 0.4 MG/SPRAY spray Place 1 spray under the tongue every 5 (five) minutes as needed for chest pain.   Yes Leonie Man, MD  Omega-3 Fatty Acids (FISH OIL) 1200 MG CAPS Take 1,200 mg by mouth 2 (two) times daily.   Yes Historical Provider, MD  Oxycodone HCl 10 MG TABS Take 5 mg by mouth every 4 (four) hours as needed (for pain).   Yes Historical Provider, MD  oxyCODONE-acetaminophen (PERCOCET) 10-325 MG per tablet Take 0.5 tablets by mouth every 4 (four) hours as needed for  pain.   Yes Historical Provider, MD  pantoprazole (PROTONIX) 40 MG tablet Take 40 mg by mouth 2 (two) times daily.   Yes Historical Provider, MD  pramipexole (MIRAPEX) 0.25 MG tablet Take 0.25 mg by mouth at bedtime.   Yes Historical Provider, MD  rosuvastatin (CRESTOR) 10 MG tablet Take 10 mg by mouth every evening.    Yes Historical Provider, MD   BP 151/84  Pulse 68  Temp(Src) 98.1 F (36.7 C) (Oral)  Resp 16  SpO2 99% Physical Exam  Nursing note and vitals reviewed. Constitutional: She is oriented to person, place, and time. She appears well-developed and well-nourished.  HENT:  Head: Normocephalic and atraumatic.  Eyes: EOM are normal. Pupils are equal, round, and reactive to light.  Neck: Neck supple. No JVD present.  Cardiovascular: Normal rate, regular rhythm and normal heart sounds.   Pulmonary/Chest: Effort normal. No respiratory distress.  Abdominal: Soft. She exhibits no distension. There is no tenderness. There is no rebound and no guarding.  Neurological: She is alert and oriented to person, place, and time.  Skin: Skin is warm and dry.    ED Course  Procedures (including critical care time) Labs Review Labs Reviewed  CBC WITH DIFFERENTIAL - Abnormal; Notable for the following:    RBC 3.75 (*)    Hemoglobin 10.4 (*)    HCT 32.0 (*)    RDW 15.7 (*)    Neutrophils Relative % 79 (*)    All other components within normal limits  D-DIMER, QUANTITATIVE - Abnormal; Notable for the following:    D-Dimer, Quant 0.69 (*)    All other components within normal limits  I-STAT CHEM 8, ED - Abnormal; Notable for the following:    Sodium 135 (*)    Creatinine, Ser 1.20 (*)    Glucose, Bld 120 (*)    Calcium, Ion 1.12 (*)    Hemoglobin 10.5 (*)    HCT 31.0 (*)    All other components within normal limits  CBG MONITORING, ED    Imaging Review  No results found.   EKG Interpretation   Date/Time:  Saturday December 29 2013 00:39:28 EDT Ventricular Rate:  78 PR  Interval:  165 QRS Duration: 101 QT Interval:  401 QTC Calculation: 457 R Axis:   48 Text Interpretation:  Sinus rhythm Borderline ST depression, inferior  leads Normal intervals No significant changes since last ekg Confirmed by  Liyanna Cartwright, MD, Abigaile Rossie [id number]) on 12/29/2013 1:35:02 AM      MDM   Final diagnoses:  Syncope and collapse    Pt comes in with syncope.  Hx of CHF, diastolic. Recent cards clearance for shoulder surgery. Recent admission for syncope, with negative yield. Syncope doesn't appear to be cardiac. There is a non cardiac prodrome and appears to be more neuro-cardiogenic than anything else. However, pt is taking estrogen and had a surgery done recently. Wells is 1.5. EKG is not showing any right sided strain, and the recent echo was neg. That being said, with the risk factors for PE, we will get dimer and r/o PE. If her PE study is neg, and her cardiac monitoring is neg, she will be discharged.   Varney Biles, MD 12/29/13 [phone]   Dr. Regenia Skeeter to f/u on VQ results. Pt to see her pcp soon.  Varney Biles, MD 12/29/13 [phone]

## 2013-12-29 NOTE — Discharge Instructions (Signed)
Syncope °Syncope is a medical term for fainting or passing out. This means you lose consciousness and drop to the ground. People are generally unconscious for less than 5 minutes. You may have some muscle twitches for up to 15 seconds before waking up and returning to normal. Syncope occurs more often in older adults, but it can happen to anyone. While most causes of syncope are not dangerous, syncope can be a sign of a serious medical problem. It is important to seek medical care.  °CAUSES  °Syncope is caused by a sudden drop in blood flow to the brain. The specific cause is often not determined. Factors that can bring on syncope include: °· Taking medicines that lower blood pressure. °· Sudden changes in posture, such as standing up quickly. °· Taking more medicine than prescribed. °· Standing in one place for too long. °· Seizure disorders. °· Dehydration and excessive exposure to heat. °· Low blood sugar (hypoglycemia). °· Straining to have a bowel movement. °· Heart disease, irregular heartbeat, or other circulatory problems. °· Fear, emotional distress, seeing blood, or severe pain. °SYMPTOMS  °Right before fainting, you may: °· Feel dizzy or light-headed. °· Feel nauseous. °· See all white or all black in your field of vision. °· Have cold, clammy skin. °DIAGNOSIS  °Your health care provider will ask about your symptoms, perform a physical exam, and perform an electrocardiogram (ECG) to record the electrical activity of your heart. Your health care provider may also perform other heart or blood tests to determine the cause of your syncope which may include: °· Transthoracic echocardiogram (TTE). During echocardiography, sound waves are used to evaluate how blood flows through your heart. °· Transesophageal echocardiogram (TEE). °· Cardiac monitoring. This allows your health care provider to monitor your heart rate and rhythm in real time. °· Holter monitor. This is a portable device that records your  heartbeat and can help diagnose heart arrhythmias. It allows your health care provider to track your heart activity for several days, if needed. °· Stress tests by exercise or by giving medicine that makes the heart beat faster. °TREATMENT  °In most cases, no treatment is needed. Depending on the cause of your syncope, your health care provider may recommend changing or stopping some of your medicines. °HOME CARE INSTRUCTIONS °· Have someone stay with you until you feel stable. °· Do not drive, use machinery, or play sports until your health care provider says it is okay. °· Keep all follow-up appointments as directed by your health care provider. °· Lie down right away if you start feeling like you might faint. Breathe deeply and steadily. Wait until all the symptoms have passed. °· Drink enough fluids to keep your urine clear or pale yellow. °· If you are taking blood pressure or heart medicine, get up slowly and take several minutes to sit and then stand. This can reduce dizziness. °SEEK IMMEDIATE MEDICAL CARE IF:  °· You have a severe headache. °· You have unusual pain in the chest, abdomen, or back. °· You are bleeding from your mouth or rectum, or you have black or tarry stool. °· You have an irregular or very fast heartbeat. °· You have pain with breathing. °· You have repeated fainting or seizure-like jerking during an episode. °· You faint when sitting or lying down. °· You have confusion. °· You have trouble walking. °· You have severe weakness. °· You have vision problems. °If you fainted, call your local emergency services (911 in [place] Do not drive   yourself to the hospital.  °MAKE SURE YOU: °· Understand these instructions. °· Will watch your condition. °· Will get help right away if you are not doing well or get worse. °Document Released: 03/15/2005 Document Revised: 03/20/2013 Document Reviewed: 05/14/2011 °[employer]® Patient Information ©2015 [employer]. This information is not intended to replace  advice given to you by your health care provider. Make sure you discuss any questions you have with your health care provider. ° °

## 2013-12-29 NOTE — ED Notes (Signed)
PER EMS: pt from home, was sitting at counter, became nauseous and felt herself passing out, family helped her down safely to the ground (no head trauma noted, no fall) and pt then lost consciousness but duration unknown because her grand daughter had a seizure at the same time shortly after pt passed out. Upon EMS arrival, pt A&Ox4, denies pain but still reporting dizziness. BP-130/80, HR-80, CBG-135, 99% RA.

## 2013-12-29 NOTE — ED Notes (Signed)
Pt talking on phone and watching tv; no needs at this time

## 2013-12-29 NOTE — ED Notes (Signed)
Dr. Nanavati at bedside 

## 2013-12-29 NOTE — ED Provider Notes (Signed)
Patient care transferred to me. The patient's V./Q. scan is normal. She is low risk for pulmonary embolism based on this. She currently feels well in and ambulated down the hall without difficulty. Her workup is otherwise unremarkable and she recently was admitted for syncope. At this time she is stable for discharge.  Ephraim Hamburger, MD 12/29/13 [phone]

## 2013-12-31 LAB — CBG MONITORING, ED: Glucose-Capillary: 137 mg/dL — ABNORMAL HIGH (ref 70–99)

## 2014-01-02 IMAGING — CR DG FOOT COMPLETE 3+V*L*
3 series · 3 of 3 positions shown · non-contrast
Comparison: None.

CLINICAL DATA: Injury and pain in the left foot.

LEFT FOOT - COMPLETE 3+ VIEW

[x foot ap left]
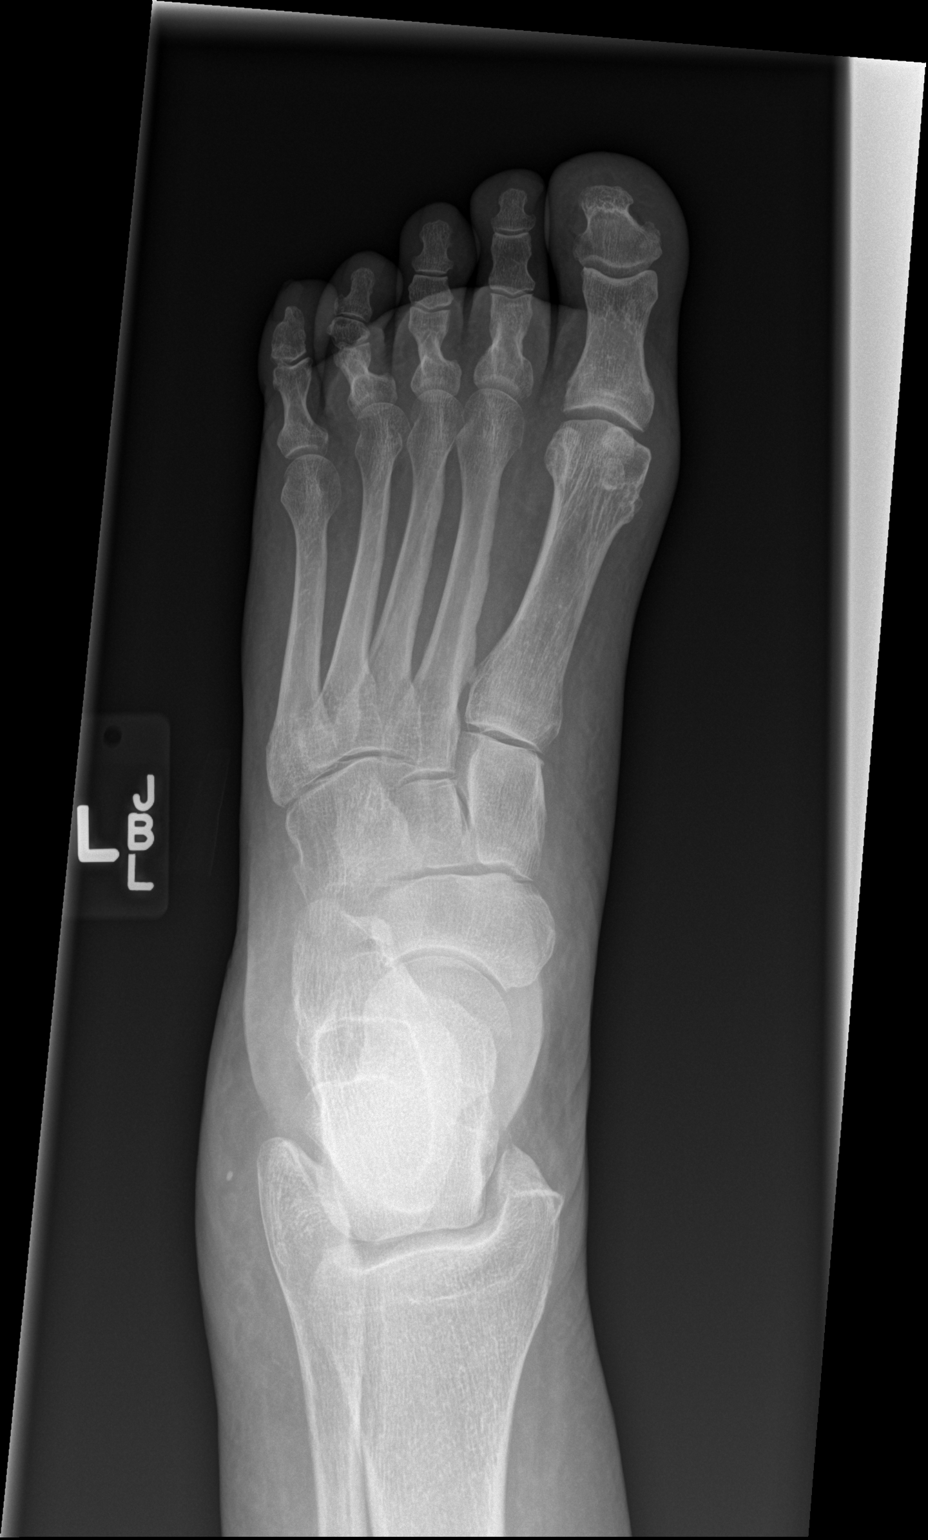

[x foot obl left]
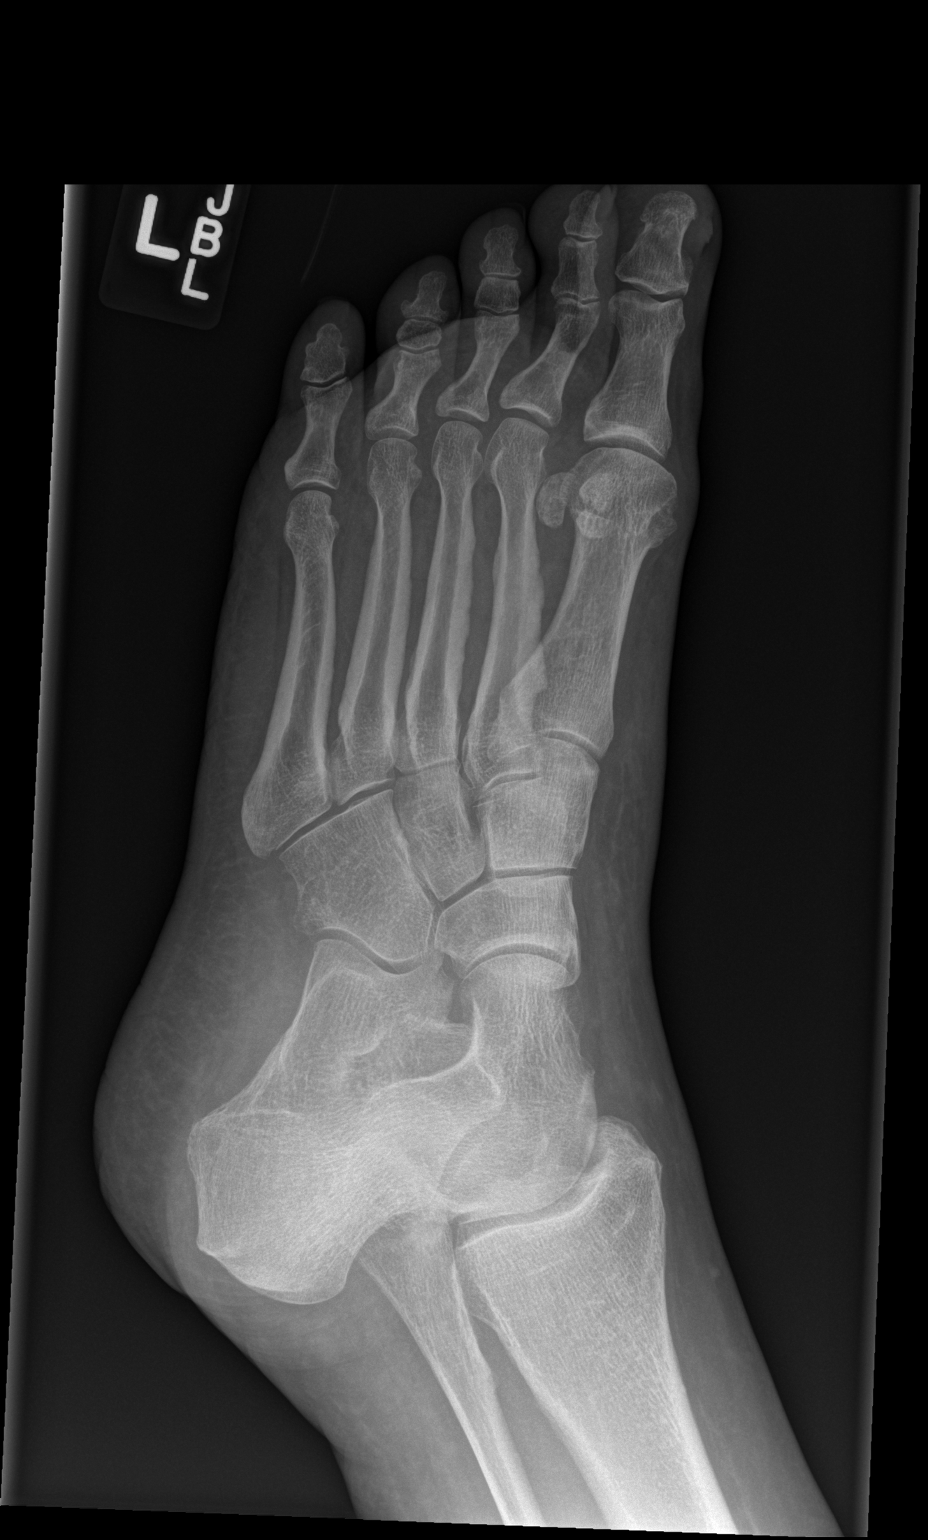

[x foot lat left]
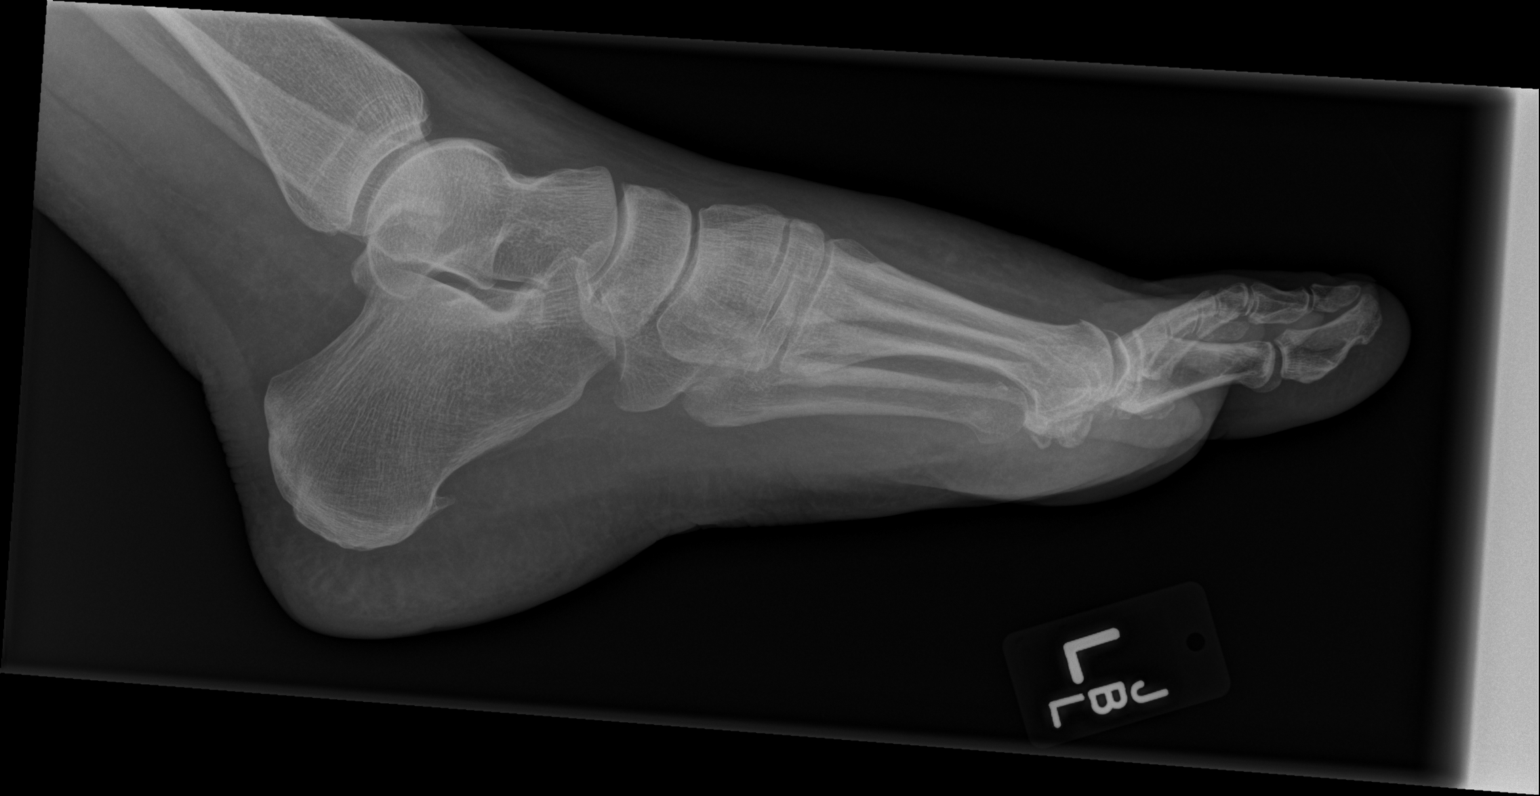

[3 of 3 positions shown; findings below may reference images not displayed]

FINDINGS: Three views of the left foot were obtained.  There is a
small spur along the plantar aspect of the calcaneus. Cannot
exclude soft tissue swelling along the dorsal aspect of the
midfoot.  Normal alignment of the bones without acute fracture or
dislocation.
IMPRESSION: No acute bony abnormality in the left foot.

## 2014-01-04 DIAGNOSIS — M25511 Pain in right shoulder: Secondary | ICD-10-CM | POA: Diagnosis not present

## 2014-01-04 DIAGNOSIS — M6281 Muscle weakness (generalized): Secondary | ICD-10-CM | POA: Diagnosis not present

## 2014-01-04 DIAGNOSIS — M25611 Stiffness of right shoulder, not elsewhere classified: Secondary | ICD-10-CM | POA: Diagnosis not present

## 2014-01-07 ENCOUNTER — Other Ambulatory Visit: Payer: Self-pay | Admitting: Dermatology

## 2014-01-07 DIAGNOSIS — L719 Rosacea, unspecified: Secondary | ICD-10-CM | POA: Diagnosis not present

## 2014-01-07 DIAGNOSIS — Z85820 Personal history of malignant melanoma of skin: Secondary | ICD-10-CM | POA: Diagnosis not present

## 2014-01-07 DIAGNOSIS — C44611 Basal cell carcinoma of skin of unspecified upper limb, including shoulder: Secondary | ICD-10-CM | POA: Diagnosis not present

## 2014-01-07 DIAGNOSIS — M25611 Stiffness of right shoulder, not elsewhere classified: Secondary | ICD-10-CM | POA: Diagnosis not present

## 2014-01-07 DIAGNOSIS — Z85828 Personal history of other malignant neoplasm of skin: Secondary | ICD-10-CM | POA: Diagnosis not present

## 2014-01-07 DIAGNOSIS — M6281 Muscle weakness (generalized): Secondary | ICD-10-CM | POA: Diagnosis not present

## 2014-01-07 DIAGNOSIS — D485 Neoplasm of uncertain behavior of skin: Secondary | ICD-10-CM | POA: Diagnosis not present

## 2014-01-07 DIAGNOSIS — L570 Actinic keratosis: Secondary | ICD-10-CM | POA: Diagnosis not present

## 2014-01-07 DIAGNOSIS — M25511 Pain in right shoulder: Secondary | ICD-10-CM | POA: Diagnosis not present

## 2014-01-10 ENCOUNTER — Encounter: Payer: Self-pay | Admitting: Neurology

## 2014-01-10 ENCOUNTER — Ambulatory Visit: Payer: Self-pay | Admitting: Neurology

## 2014-01-10 ENCOUNTER — Ambulatory Visit (INDEPENDENT_AMBULATORY_CARE_PROVIDER_SITE_OTHER): Payer: Medicare Other | Admitting: Neurology

## 2014-01-10 VITALS — BP 179/94 | HR 72 | Ht 66.25 in | Wt 177.2 lb

## 2014-01-10 DIAGNOSIS — I2510 Atherosclerotic heart disease of native coronary artery without angina pectoris: Secondary | ICD-10-CM

## 2014-01-10 DIAGNOSIS — R413 Other amnesia: Secondary | ICD-10-CM

## 2014-01-10 DIAGNOSIS — M25611 Stiffness of right shoulder, not elsewhere classified: Secondary | ICD-10-CM | POA: Diagnosis not present

## 2014-01-10 DIAGNOSIS — Z9861 Coronary angioplasty status: Secondary | ICD-10-CM

## 2014-01-10 DIAGNOSIS — M6281 Muscle weakness (generalized): Secondary | ICD-10-CM | POA: Diagnosis not present

## 2014-01-10 DIAGNOSIS — M25511 Pain in right shoulder: Secondary | ICD-10-CM | POA: Diagnosis not present

## 2014-01-10 NOTE — Patient Instructions (Addendum)
I had a long discussion the patient has regards to her mild cognitive impairment and recommend she continue Cerefolin nac and participate in mentally challenging activities. Continue Depakote for now. Return for followup in 6 months with Charlott Holler, NP or. call earlier if necessary

## 2014-01-10 NOTE — Progress Notes (Signed)
PATIENT: [name] DOB: 03-22-1941  REASON FOR VISIT: follow up for MCI vs. 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  Update 12/06/2012 (PS): [name] returns for f/u after last vist 06/07/2012. She has stopped cerefolin nac as insurance is not paying for it. Memory loss seems stable but she feels subjectively she was doing better on Cerefolin nac. She recently fell of a tractor in her yard and hurt her foot but is improving. She continues to have poor recent memory but is able to recall later. She decided not to do expedition 3 study upon advise from [name] her cardiologist.   Update 03/08/13 ([place]): [name] returns to office for follow up for memory. She reports that she has some bad days and good days. Her husband agrees that her memory deficit is short-term memory, and that it fluctuates. He states that she is very aware of the days where she is not feeling her best; those days she stays at home and keeps to herself. She states that she has times when she feels "like she is going to leave her body." She denies the feeling as a feeling of panic or a feeling of something bad is about to happen. She has felt very "swimmy-headed" for the last few months. She denies the feeling of the room spinning or that she is in motion.   Update 07/11/13 ([place]): [name] returns for follow up for memory problems.  She has been tolerating Cerefolin NAC well, states it gives her energy and her memory is better. MMSE 28/30 today, CDT 4/4, AFT 11, GDS 1.  There is 1 point drop in MMSE since 6 months ago.  She is very bothered by restless legs at night, she states she feels like she must keep moving her legs due to a crawling sensation.  The sensation wakes her up at night also, but she denies "acting-out" dreams.  History is difficult to obtain due to her husband interjecting comments and fighting amongst themselves. Update  01/10/2014 ; she returns for followup after last visit 6 months ago who. She is scheduled in about the same. She continues to have mild short-term memory difficulties unchanged. She has been taking several daily and finds it quite useful. She saw primary physician Dr. Gerarda Fraction who suggested she stop the Depakote. 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 She used to also have some light sensitivity and possibly migrainous phenomena which also seemed to be controlled on Depakote. She underwent right shoulder surgery 2 weeks ago which went well and she has no complaints. She was hospitalized briefly twice for syncopal episodes and she thought to be vasovagal related to a diarrhea episode and dehydration.  REVIEW OF SYSTEMS: Full 14 system review of systems performed and notable only for:   Memory loss, syncope, passing out, shoulder plan, diarrhea and also systems negative  ALLERGIES: Allergies  Allergen Reactions  . Lipitor [Atorvastatin] Other (See Comments)    Bone and muscle pain  . Nsaids Swelling  . Reglan [Metoclopramide]     Tardive dyskinesia; paradoxical reaction not relieved by benadryl  . Ultram [Tramadol] Other (See Comments)    Pt has seizure activity with this medication    HOME MEDICATIONS: Outpatient Prescriptions Prior to Visit  Medication Sig Dispense Refill  . Calcium Carb-Cholecalciferol (CALCIUM 600 + D PO) Take 1 tablet by mouth 2 (two)  times daily. Chewable      . cholecalciferol (VITAMIN D) 1000 UNITS tablet Take 1,000 Units by mouth daily.       . clopidogrel (PLAVIX) 75 MG tablet Take 75 mg by mouth daily.      Marland Kitchen Cod Liver Oil 1000 MG CAPS Take 1,000 mg by mouth daily.      Marland Kitchen CRANBERRY-VITAMIN C PO Take 1 capsule by mouth daily.       . diazepam (VALIUM) 5 MG tablet Take 2.5-5 mg by mouth 2 (two) times daily. Taking 2.5 mg in the morning and 5mg  at bedtime      . diphenhydrAMINE (BENADRYL) 25 mg capsule Take 25 mg by mouth at  bedtime as needed for sleep.      . divalproex (DEPAKOTE) 500 MG DR tablet Take 500 mg by mouth daily.      Marland Kitchen estrogens, conjugated, (PREMARIN) 0.3 MG tablet Take 0.3 mg by mouth daily.       Marland Kitchen gabapentin (NEURONTIN) 100 MG capsule Take 100 mg by mouth at bedtime.      . isosorbide mononitrate (IMDUR) 60 MG 24 hr tablet Take 60 mg by mouth See admin instructions. *takes daily except for on wednesdays*      . Krill Oil Omega-3 300 MG CAPS Take 300 mg by mouth daily.      . metFORMIN (GLUCOPHAGE) 500 MG tablet Take 500 mg by mouth 2 (two) times daily with a meal.      . Methylfol-Methylcob-Acetylcyst (CEREFOLIN NAC) 6-2-600 MG TABS Take 1 tablet by mouth daily.      . metoprolol succinate (TOPROL-XL) 25 MG 24 hr tablet Take 25 mg by mouth at bedtime.      . metroNIDAZOLE (METROCREAM) 0.75 % cream Apply 1 application topically 2 (two) times daily.       . Multiple Vitamins-Minerals (HAIR/SKIN/NAILS PO) Take 1 capsule by mouth every morning.      . nitroGLYCERIN (NITROLINGUAL) 0.4 MG/SPRAY spray Place 1 spray under the tongue every 5 (five) minutes as needed for chest pain.      . Omega-3 Fatty Acids (FISH OIL) 1200 MG CAPS Take 1,200 mg by mouth 2 (two) times daily.      . Oxycodone HCl 10 MG TABS Take 5 mg by mouth every 4 (four) hours as needed (for pain).      Marland Kitchen oxyCODONE-acetaminophen (PERCOCET) 10-325 MG per tablet Take 0.5 tablets by mouth every 4 (four) hours as needed for pain.      . pantoprazole (PROTONIX) 40 MG tablet Take 40 mg by mouth 2 (two) times daily.      . pramipexole (MIRAPEX) 0.25 MG tablet Take 0.25 mg by mouth at bedtime.      . rosuvastatin (CRESTOR) 10 MG tablet Take 10 mg by mouth every evening.       Marland Kitchen losartan (COZAAR) 25 MG tablet Take 25 mg by mouth daily.       No facility-administered medications prior to visit.    PHYSICAL EXAM  Filed Vitals:   01/10/14 1040  BP: 179/94  Pulse: 72  Height: 5' 6.25" (1.683 m)  Weight: 177 lb 3.2 oz (80.377 kg)   Body mass  index is 28.38 kg/(m^2).  General: Frail elderly Caucasian seated, in no evident distress  Head: head normocephalic and atraumatic. Orohparynx benign  Neck: supple with no carotid or supraclavicular bruits  Cardiovascular: regular rate and rhythm, no murmurs  Musculoskeletal: no deformity  Skin: no rash/petichiae  Vascular: Normal pulses all extremities  Neurologic Exam  Mental Status: Awake and fully alert. Oriented to place and time. Recent and remote memory intact. Attention span, concentration and fund of knowledge appropriate. Mood and affect appropriate. MMSE 30/30, AFT 11, CDT 4/4, .  (MMSE 29/30.1 deficit in orientation. AFT 14.on last visit)  Cranial Nerves: Pupils equal, briskly reactive to light. Extraocular movements full without nystagmus. Visual fields full to confrontation. Hearing intact. Facial sensation intact. Face, tongue, palate moves normally and symmetrically.  Motor: Normal bulk and tone. Normal strength in all tested extremity muscles. Mild head tremor at rest.  Sensory: intact to touch and pinprick and vibratory.  Coordination: Rapid alternating movements normal in all extremities. Finger-to-nose and heel-to-shin performed accurately bilaterally.  Gait and Station: Arises from chair without difficulty. Stance is normal. Gait demonstrates normal stride length and balance . Able to heel, toe and tandem walk without difficulty.  Reflexes: 1+ and symmetric.  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  Likely restless leg syndrome.  PLAN:   I   had a long discussion the patient has regards to her mild cognitive impairment and recommend she continue Cerefolin nac and participate in mentally challenging activities. Continue Depakote for now. Return for followup in 6 months with Charlott Holler, NP or. call earlier if necessary  Meds ordered this encounter    Medications  . Methylfol-Methylcob-Acetylcyst (CEREFOLIN NAC) 6-2-600 MG TABS    Sig: Take 1 capsule by mouth daily. Take 1 tablet by mouth daily    Dispense:  90 each    Refill:  3    Order Specific Question:  Supervising Provider    Answer:  Leonie Man, [place] S [[id number]]  . pramipexole (MIRAPEX) 0.25 MG tablet    Sig: Take 1 tablet (0.25 mg total) by mouth at bedtime.    Dispense:  90 tablet    Refill:  0    Order Specific Question:  Supervising Provider    Answer:  Garvin Fila [[id number]]   Return in about 6 months (around 07/12/2014).  Antony Contras, MD  01/10/2014, 12:46 PM [facility] [address], [place], [place] [address] [phone]  Note: This document was prepared with digital dictation and possible smart phrase technology. Any transcriptional errors that result from this process are unintentional.

## 2014-01-15 DIAGNOSIS — M6281 Muscle weakness (generalized): Secondary | ICD-10-CM | POA: Diagnosis not present

## 2014-01-15 DIAGNOSIS — M25611 Stiffness of right shoulder, not elsewhere classified: Secondary | ICD-10-CM | POA: Diagnosis not present

## 2014-01-15 DIAGNOSIS — M25511 Pain in right shoulder: Secondary | ICD-10-CM | POA: Diagnosis not present

## 2014-01-17 DIAGNOSIS — M25611 Stiffness of right shoulder, not elsewhere classified: Secondary | ICD-10-CM | POA: Diagnosis not present

## 2014-01-17 DIAGNOSIS — M25511 Pain in right shoulder: Secondary | ICD-10-CM | POA: Diagnosis not present

## 2014-01-17 DIAGNOSIS — M6281 Muscle weakness (generalized): Secondary | ICD-10-CM | POA: Diagnosis not present

## 2014-01-22 DIAGNOSIS — M6281 Muscle weakness (generalized): Secondary | ICD-10-CM | POA: Diagnosis not present

## 2014-01-22 DIAGNOSIS — M25511 Pain in right shoulder: Secondary | ICD-10-CM | POA: Diagnosis not present

## 2014-01-22 DIAGNOSIS — M25611 Stiffness of right shoulder, not elsewhere classified: Secondary | ICD-10-CM | POA: Diagnosis not present

## 2014-01-29 DIAGNOSIS — M25611 Stiffness of right shoulder, not elsewhere classified: Secondary | ICD-10-CM | POA: Diagnosis not present

## 2014-01-29 DIAGNOSIS — M6281 Muscle weakness (generalized): Secondary | ICD-10-CM | POA: Diagnosis not present

## 2014-01-29 DIAGNOSIS — M25511 Pain in right shoulder: Secondary | ICD-10-CM | POA: Diagnosis not present

## 2014-02-03 ENCOUNTER — Other Ambulatory Visit: Payer: Self-pay | Admitting: Cardiology

## 2014-02-04 DIAGNOSIS — C44619 Basal cell carcinoma of skin of left upper limb, including shoulder: Secondary | ICD-10-CM | POA: Diagnosis not present

## 2014-02-04 DIAGNOSIS — D485 Neoplasm of uncertain behavior of skin: Secondary | ICD-10-CM | POA: Diagnosis not present

## 2014-02-04 NOTE — Telephone Encounter (Signed)
E sent to pharmacy. 1 refill. appt in Dec 2015

## 2014-02-12 ENCOUNTER — Encounter: Payer: Self-pay | Admitting: Neurology

## 2014-02-12 DIAGNOSIS — M1611 Unilateral primary osteoarthritis, right hip: Secondary | ICD-10-CM | POA: Diagnosis not present

## 2014-02-15 DIAGNOSIS — M1611 Unilateral primary osteoarthritis, right hip: Secondary | ICD-10-CM | POA: Diagnosis not present

## 2014-02-24 IMAGING — CR DG PELVIS 1-2V
1 series · 1 of 1 positions shown · non-contrast
Comparison: None

CLINICAL DATA: Pelvic pain.

PELVIS - 1-2 VIEW

[view not recorded]
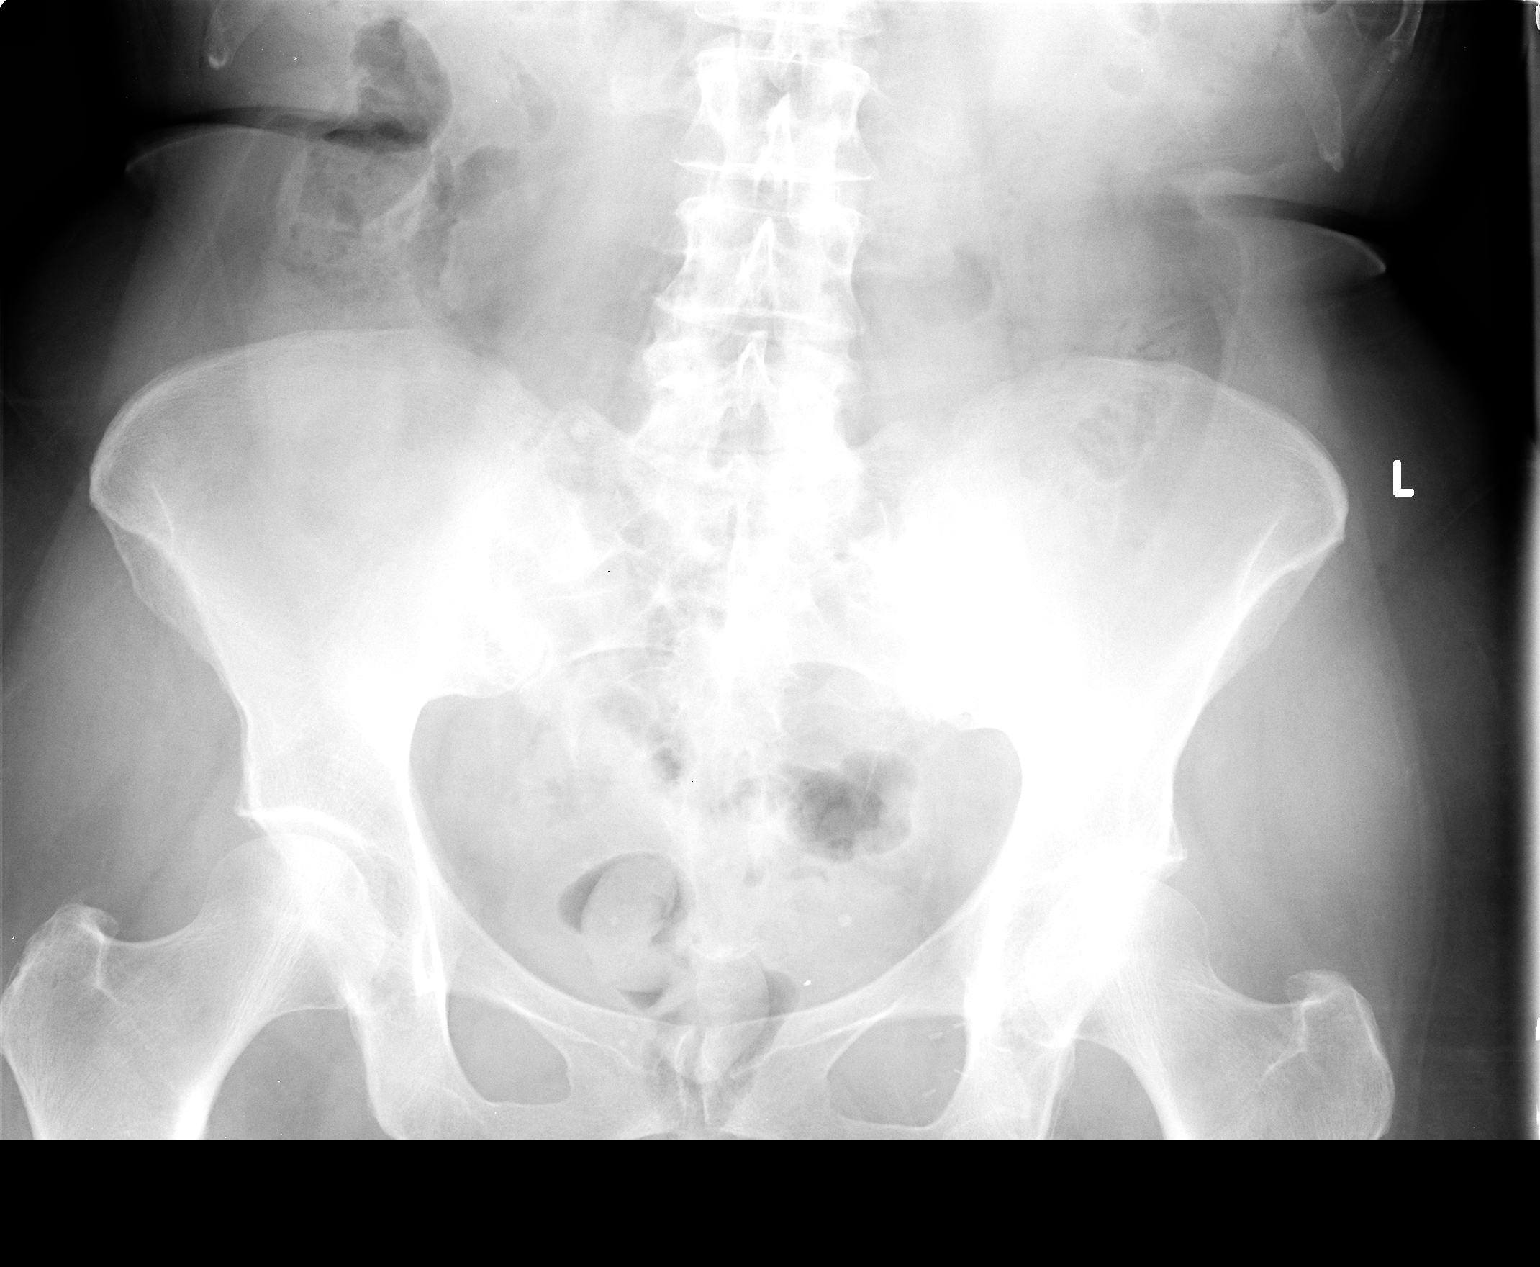

[1 of 1 positions shown; findings below may reference images not displayed]

FINDINGS: Both hips are normally located.  No hip fracture or plain
film evidence of avascular necrosis.  The pubic symphysis and SI
joints are intact.  No definite pelvic fractures.
IMPRESSION: No acute bony findings.

## 2014-02-24 IMAGING — US US EXTREM LOW VENOUS*L*
1 series · 13 of 24 positions shown · non-contrast
Comparison: Tib-fib radiographs from the same day.

CLINICAL DATA: 72-year-old female with left lower leg pain, edema
and color changes.  History of injury.  Diabetes.

LEFT LOWER EXTREMITY VENOUS DUPLEX ULTRASOUND
TECHNIQUE: Gray-scale sonography with graded compression, as well
as color Doppler and duplex ultrasound, were performed to evaluate
the deep venous system of the lower extremity from the level of the
common femoral vein through the popliteal and proximal calf veins.
Spectral Doppler was utilized to evaluate flow at rest and with
distal augmentation maneuvers.

[Series 1: us extrem low venous*left* · 0.09mm/px · 13 of 39 slices shown]
[im 1/39]
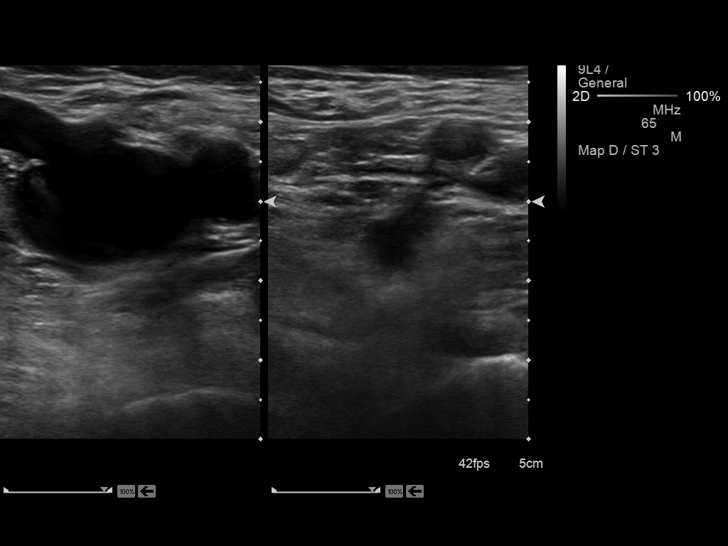
[im 4/39]
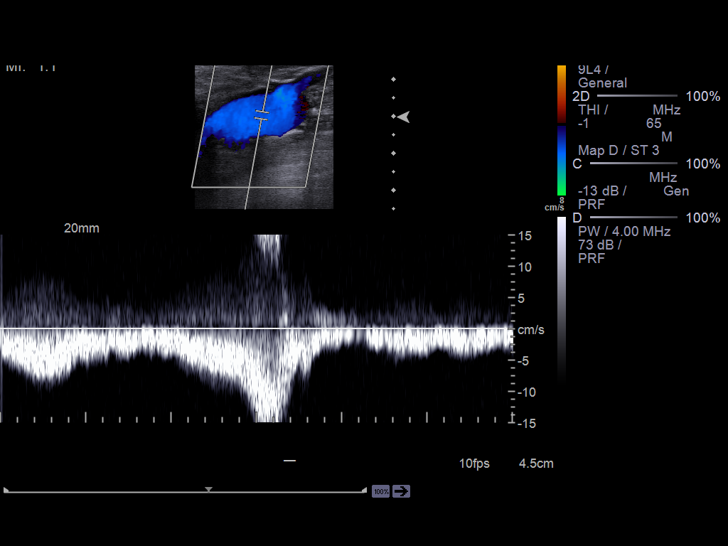
[im 7/39]
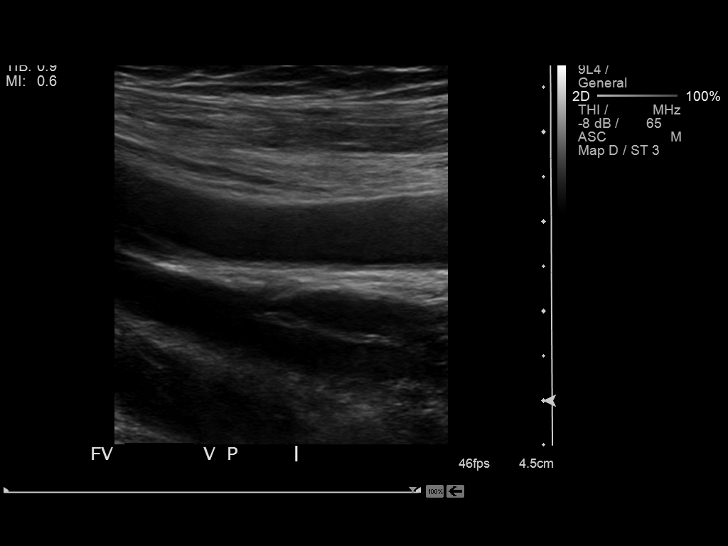
[im 10/39]
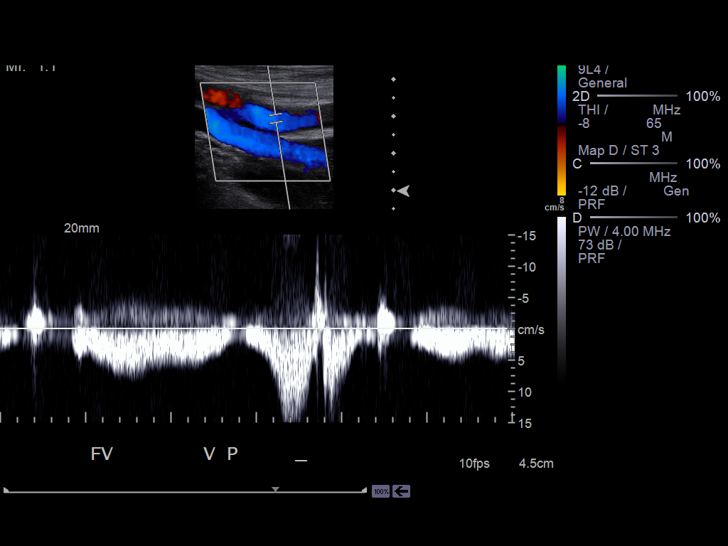
[im 14/39]
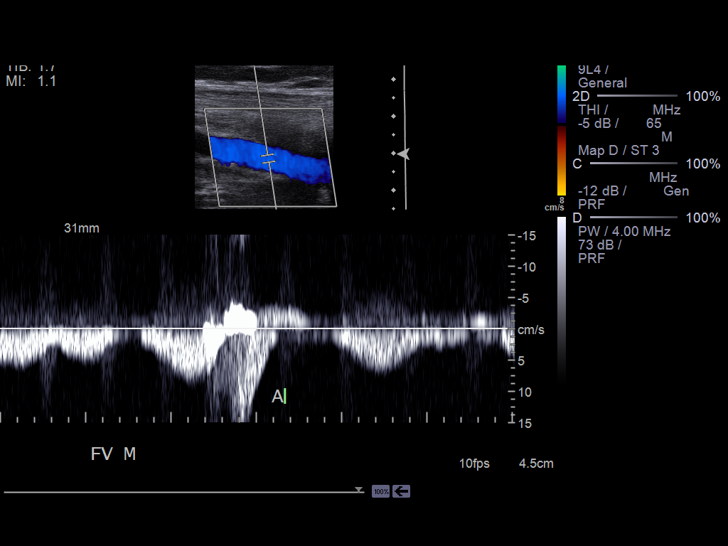
[im 17/39]
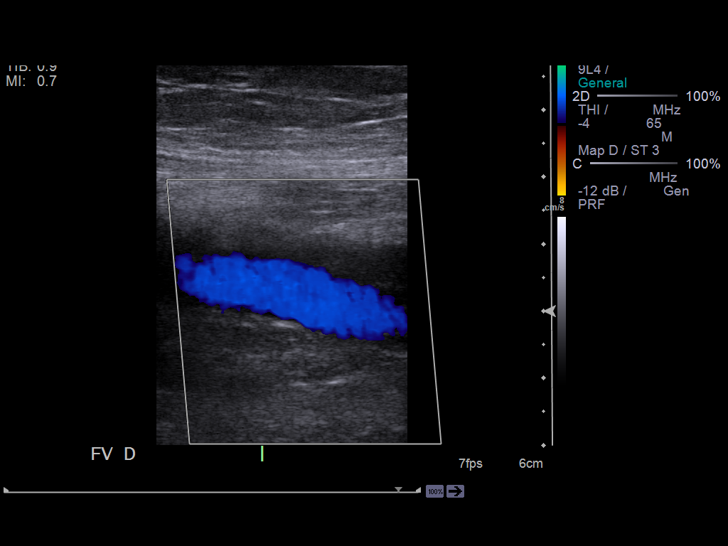
[im 20/39]
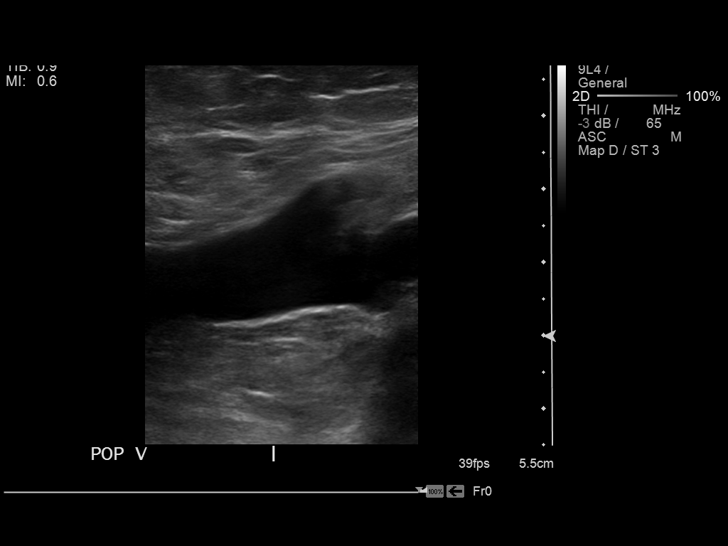
[im 22/39]
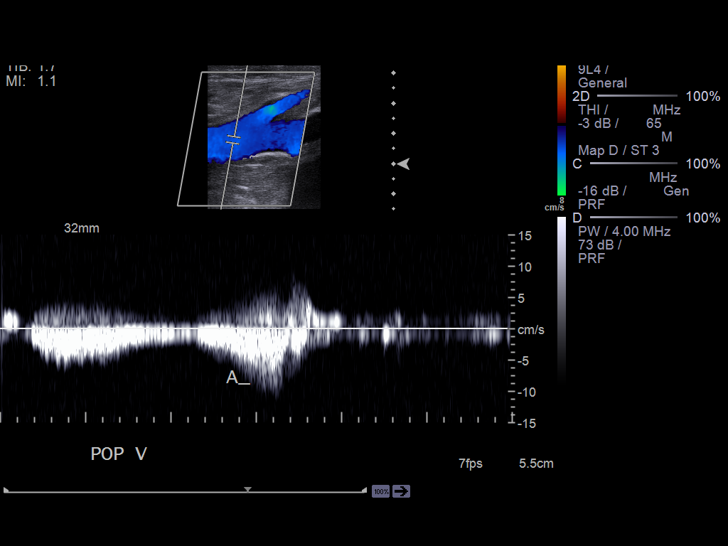
[im 25/39]
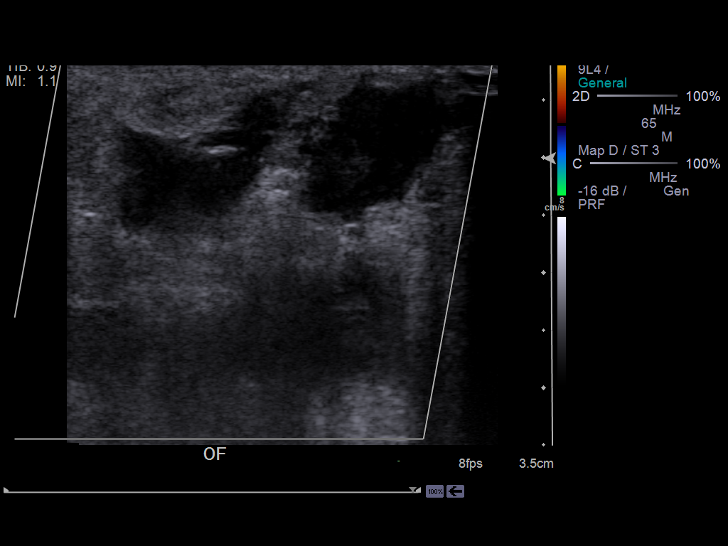
[im 29/39]
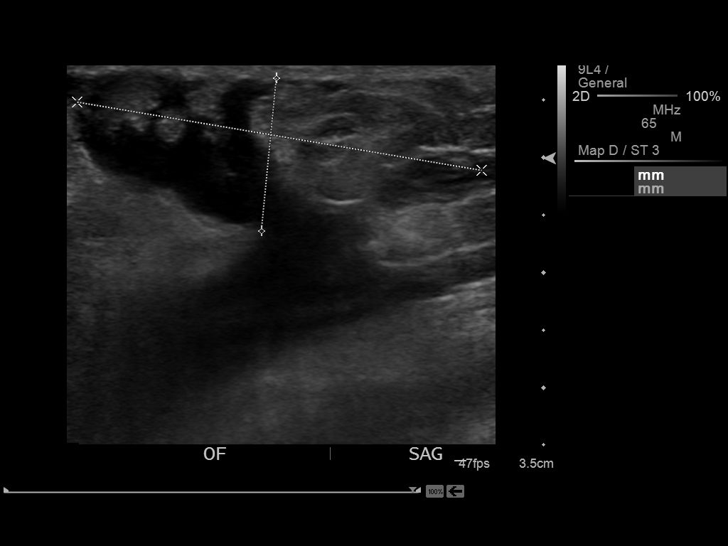
[im 32/39]
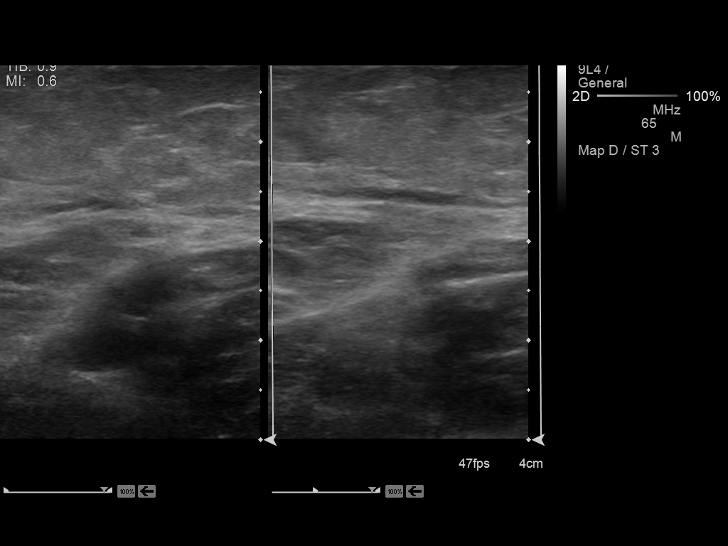
[im 35/39]
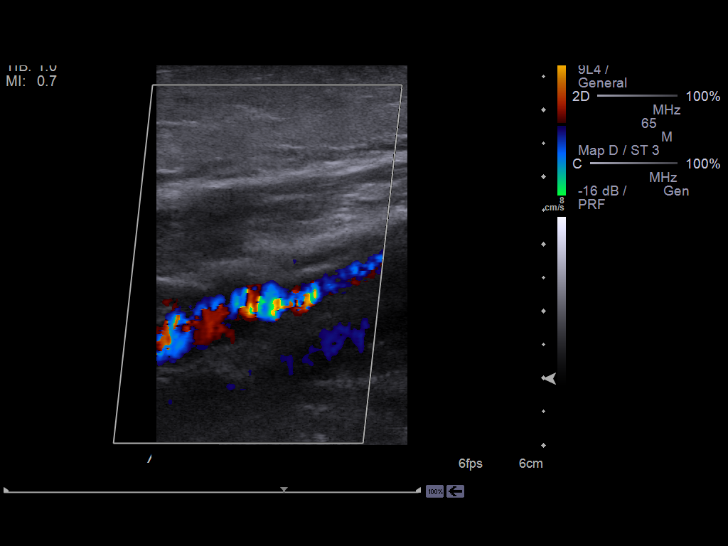
[im 39/39]
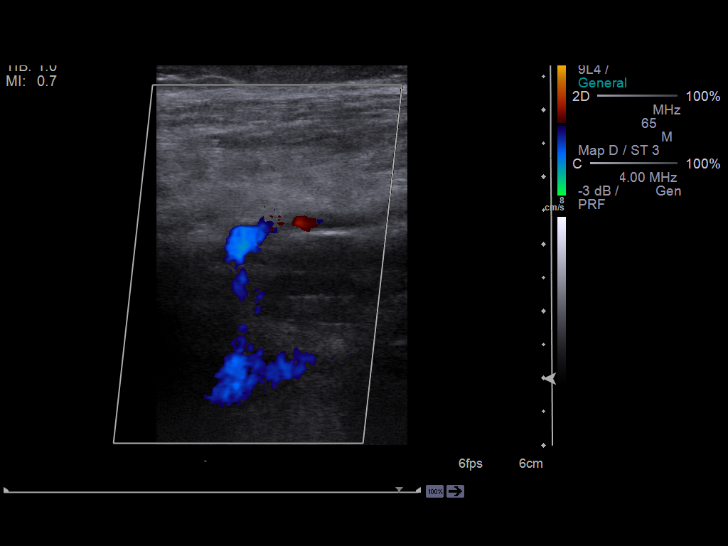

[13 of 24 positions shown; findings below may reference images not displayed]

FINDINGS: Normal compressibility of the left common femoral,
superficial femoral, and popliteal veins is demonstrated, as well
as the visualized proximal calf veins.  No filling defects to
suggest DVT on grayscale or color Doppler imaging.  Doppler
waveforms show normal direction of venous flow, normal respiratory
phasicity and response to augmentation.   Visible greater saphenous
vein is patent.

Scanning of the area of visible bruise at the left calf reveals an
irregular subcutaneous fluid collection (images 26, 27),
encompassing 3.6 x 1.3 x 2.9 cm.  This has some more heterogeneous
components and is most compatible with an evolving hematoma in this
setting.  There is no associated vascularity identified.
IMPRESSION: 1. No evidence of left lower extremity deep venous thrombosis.
2.  Complex subcutaneous collection at the left calf underlying
visible bruising most compatible with post traumatic hematoma.

## 2014-02-24 IMAGING — CR DG FEMUR 2+V*R*
4 series · 4 of 4 positions shown · non-contrast
Comparison: None.

CLINICAL DATA: Right leg pain.

RIGHT FEMUR - 2 VIEW

[view not recorded (1 of 4)]
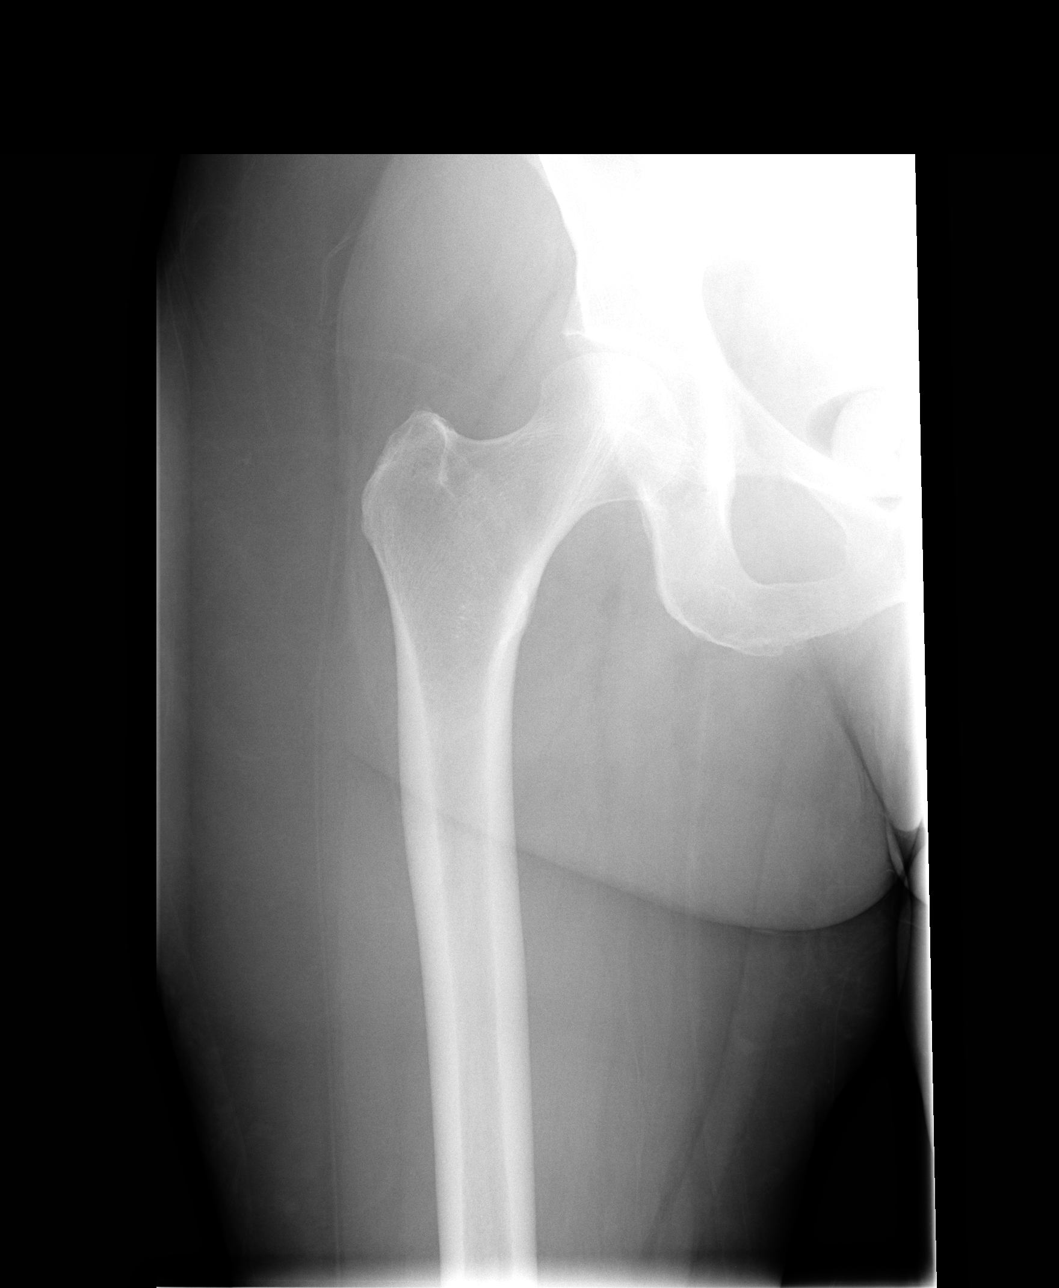

[view not recorded (2 of 4)]
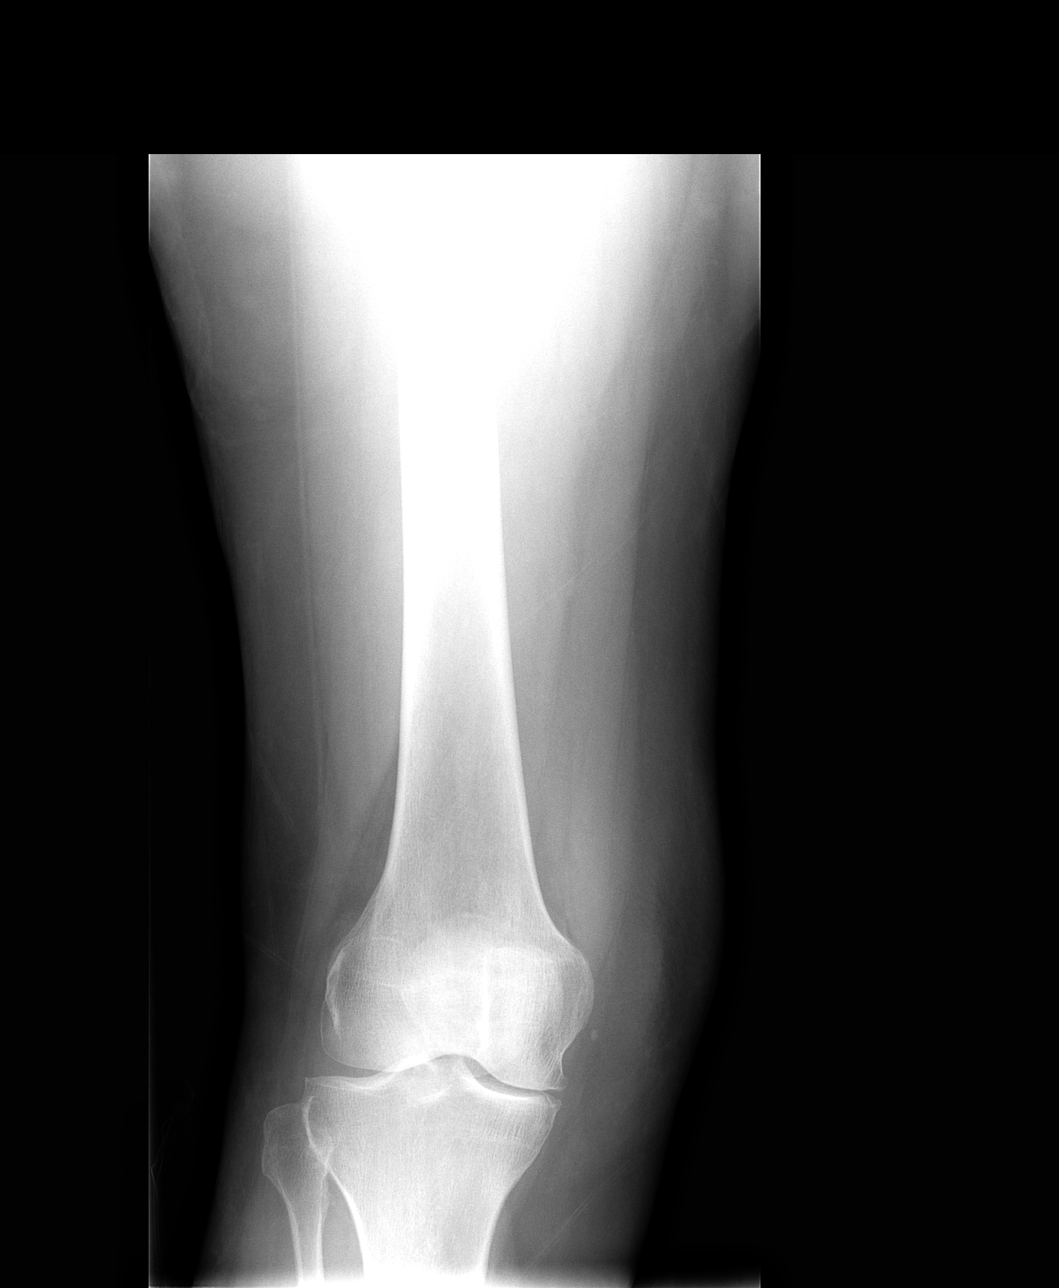

[view not recorded (3 of 4)]
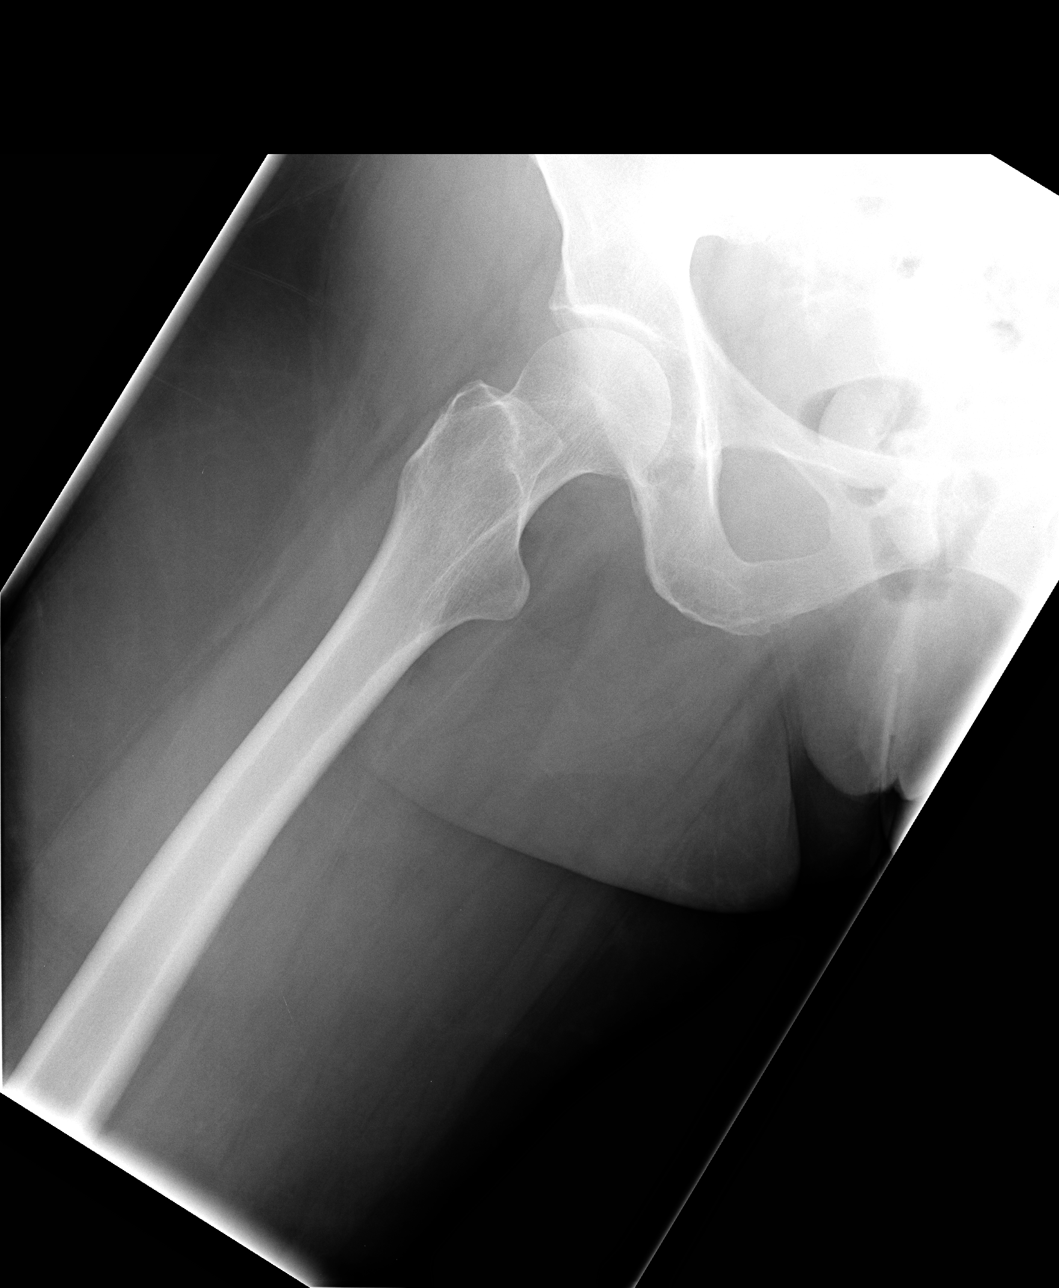

[view not recorded (4 of 4)]
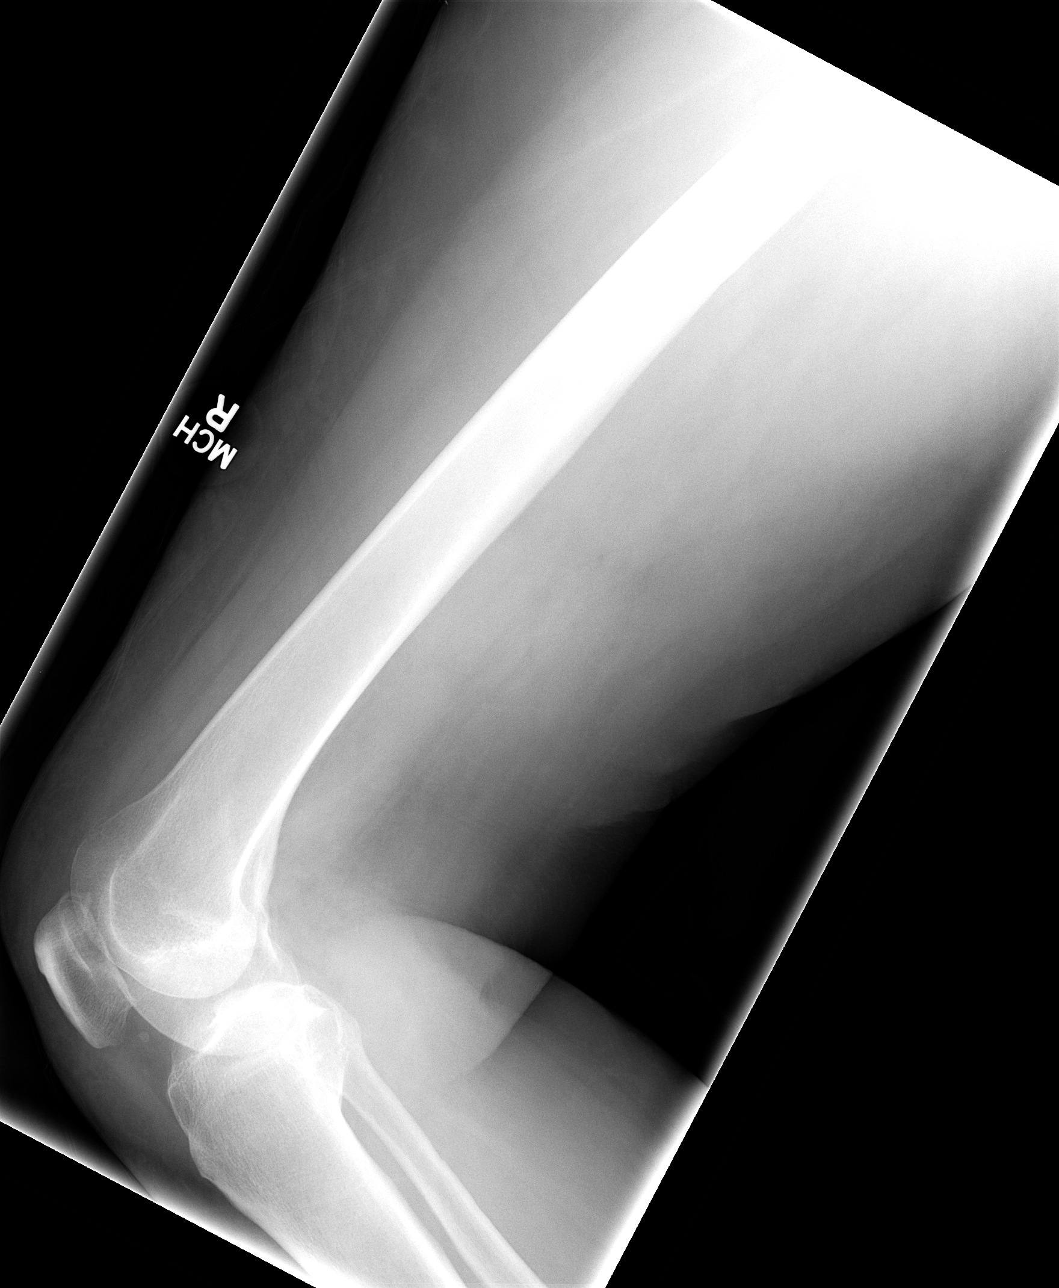

[4 of 4 positions shown; findings below may reference images not displayed]

FINDINGS: The hip and knee joints are maintained.  Mild
degenerative changes.  No acute femur fracture.
IMPRESSION: No acute bony findings.

## 2014-02-24 IMAGING — CR DG TIBIA/FIBULA 2V*R*
2 series · 2 of 2 positions shown · non-contrast
Comparison: None

CLINICAL DATA: Injured right leg.

RIGHT TIBIA AND FIBULA - 2 VIEW

[view not recorded (1 of 2)]
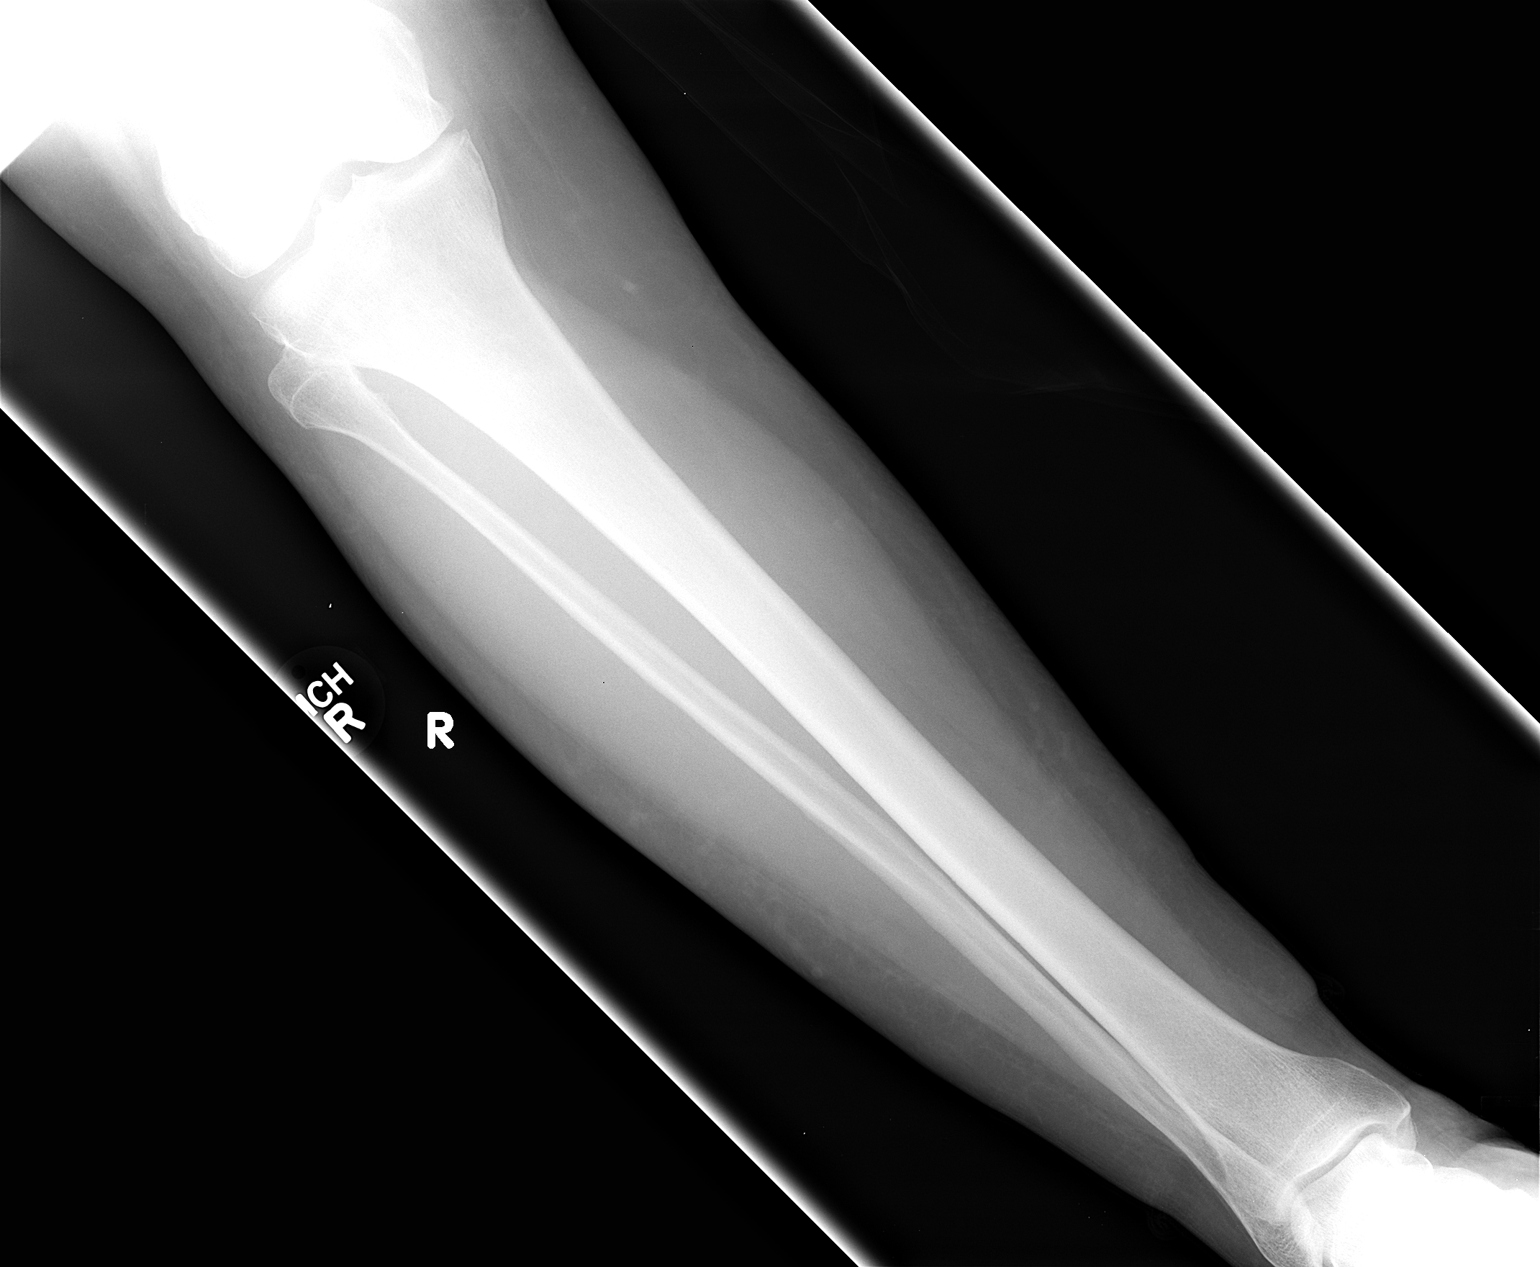

[view not recorded (2 of 2)]
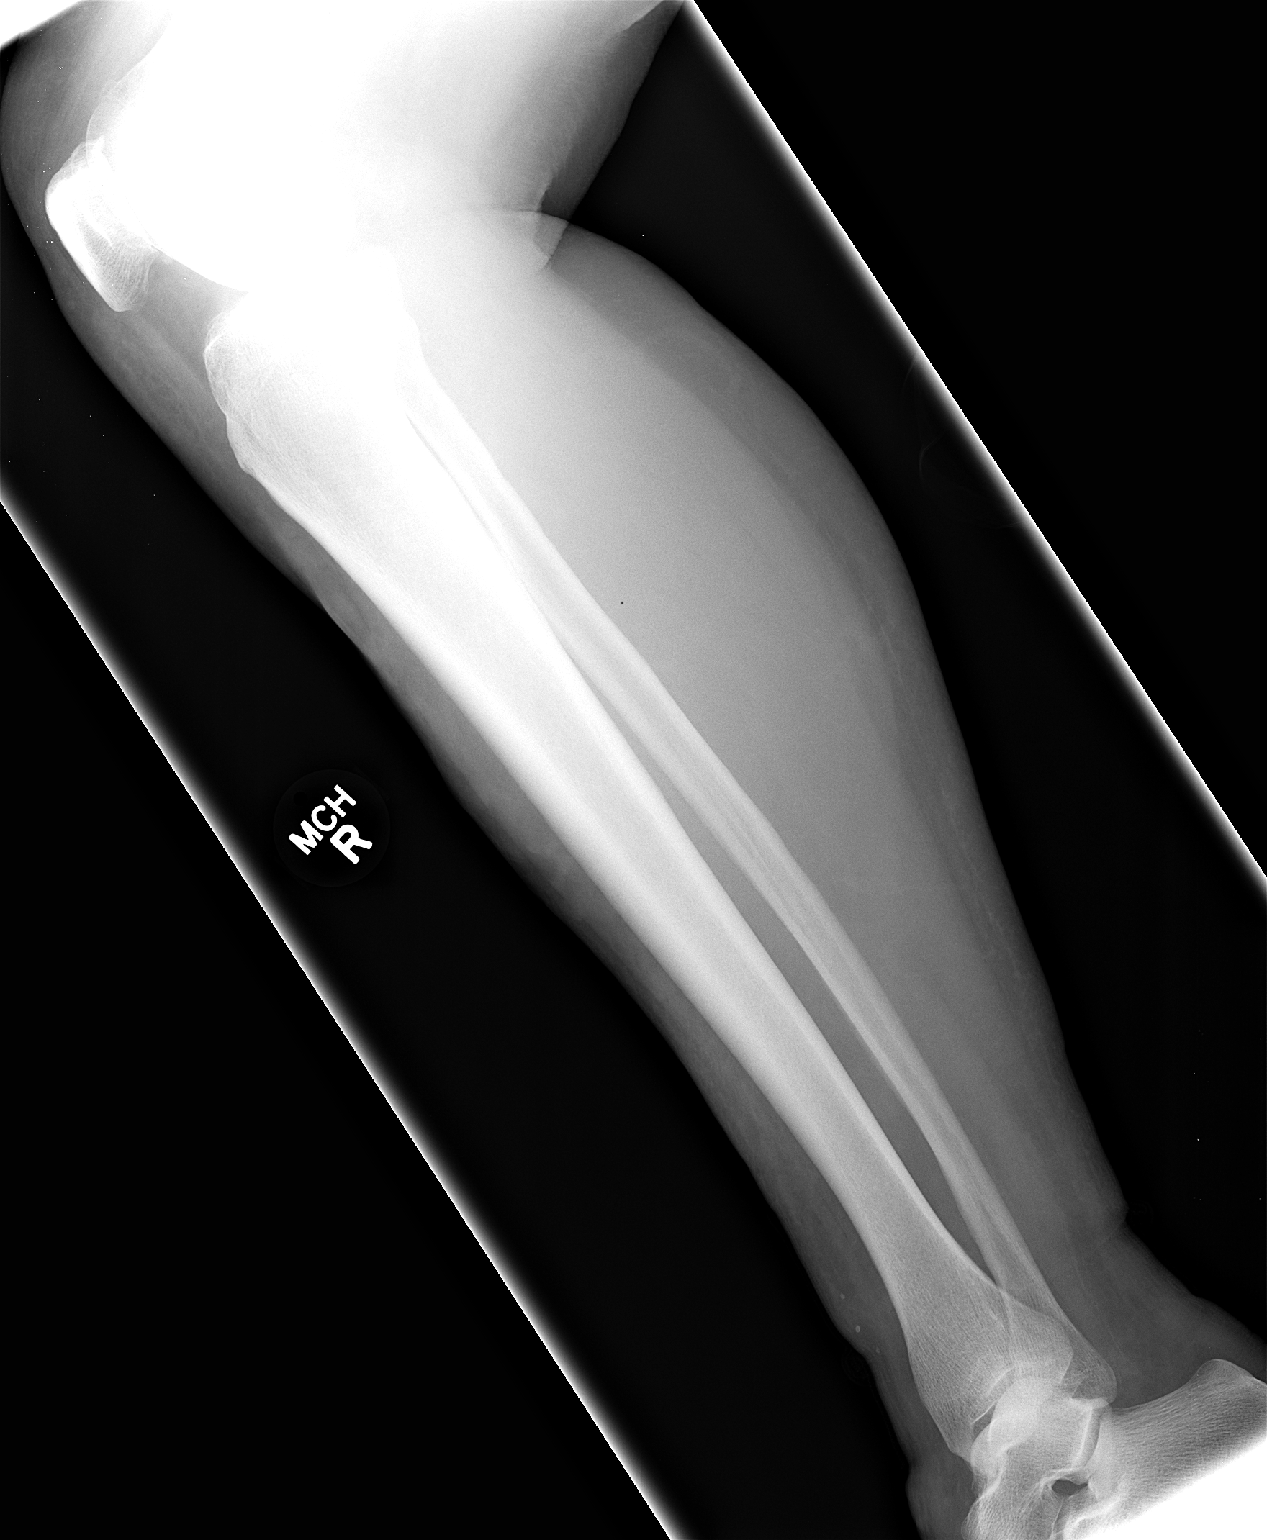

[2 of 2 positions shown; findings below may reference images not displayed]

FINDINGS: The knee and ankle joints are maintained.  No acute
fracture.  No osteochondral lesion.
IMPRESSION: No acute bony findings.

## 2014-02-24 IMAGING — CR DG TIBIA/FIBULA 2V*L*
2 series · 2 of 2 positions shown · non-contrast
Comparison: None

CLINICAL DATA: Injured left leg.

LEFT TIBIA AND FIBULA - 2 VIEW

[view not recorded (1 of 2)]
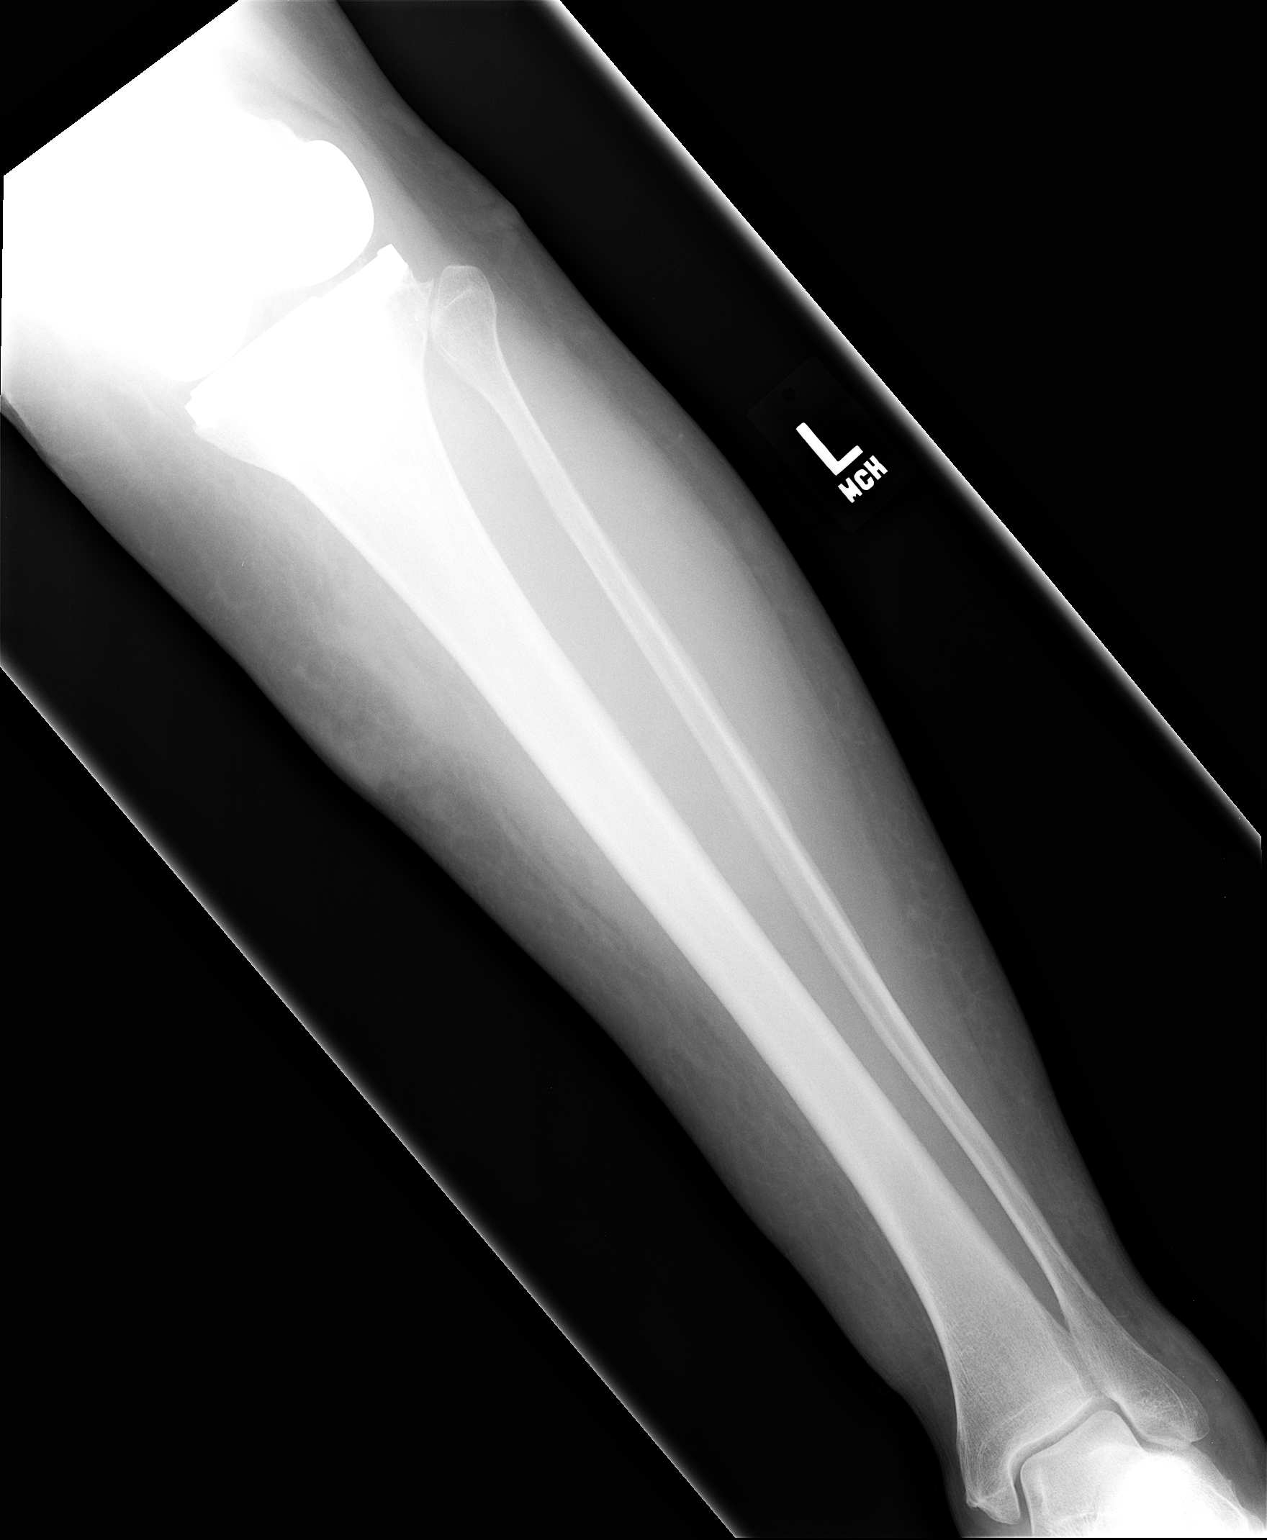

[view not recorded (2 of 2)]
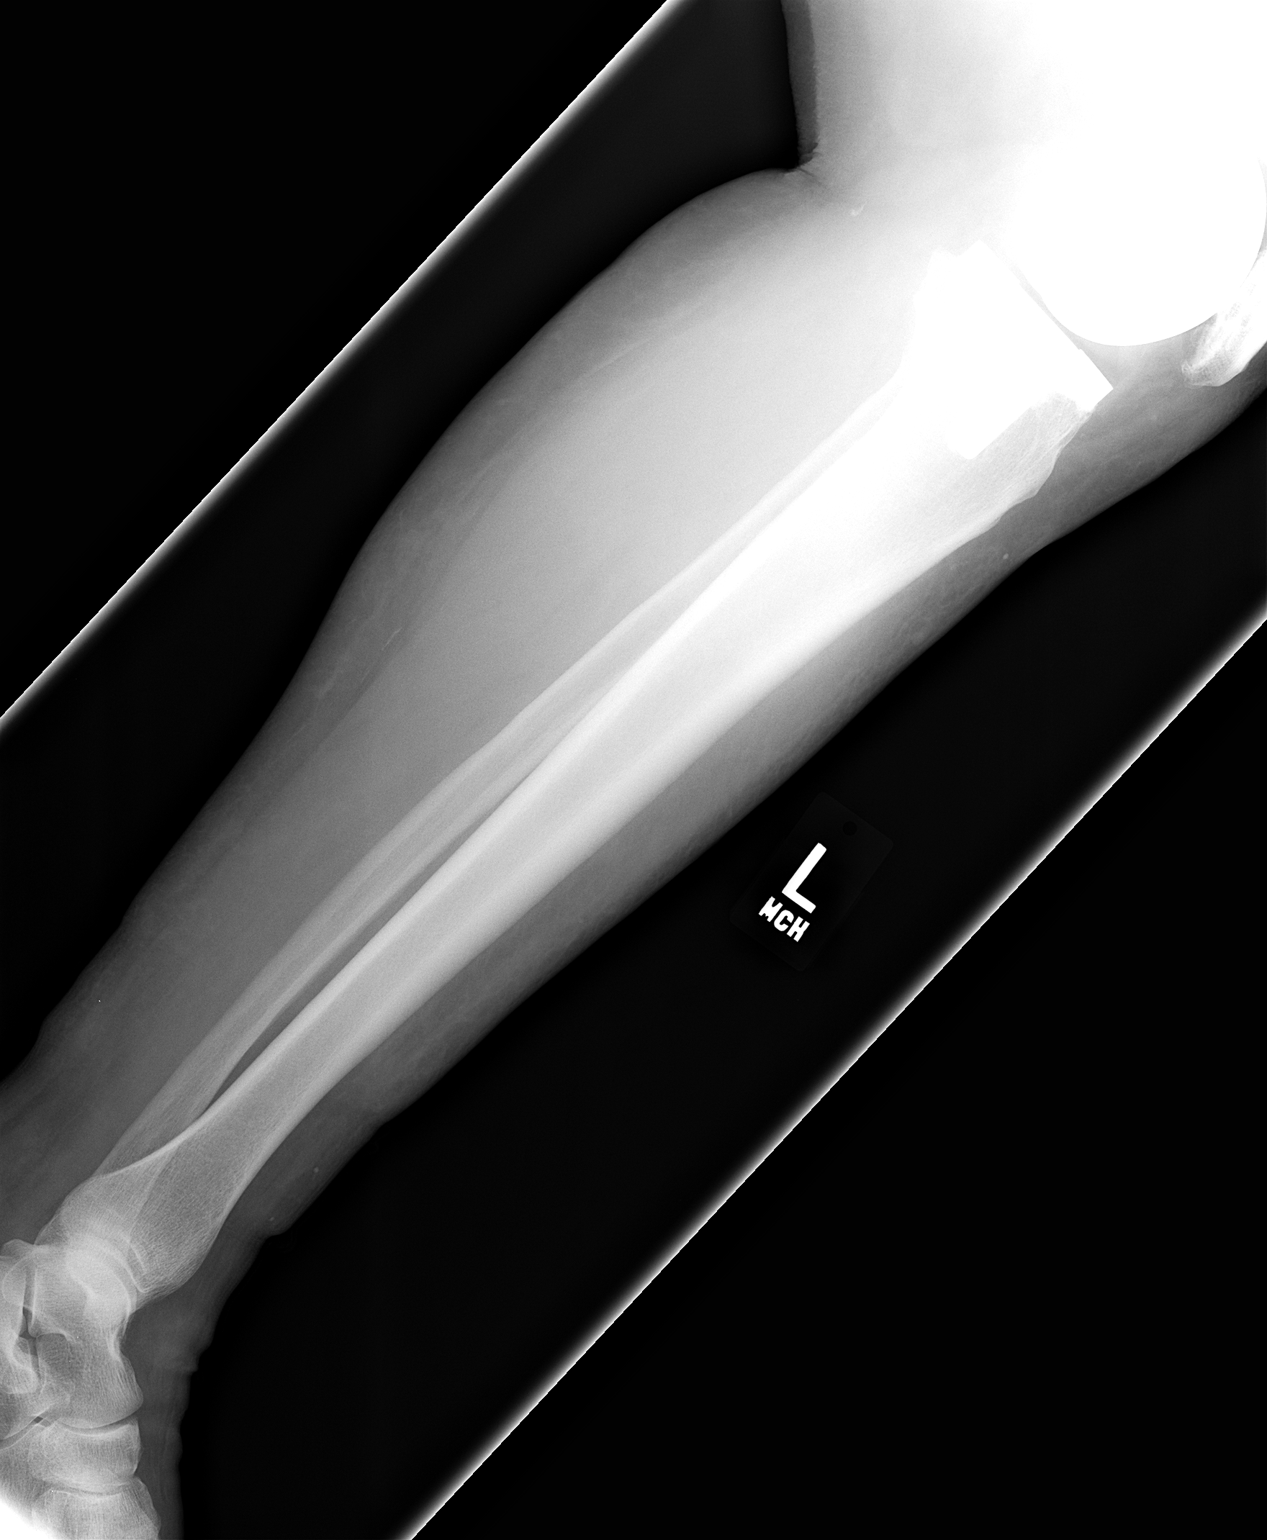

[2 of 2 positions shown; findings below may reference images not displayed]

FINDINGS: The knee and ankle joints are maintained.  The left knee
prosthesis is intact.  No acute fracture of the tibia or fibula is
identified.
IMPRESSION: No acute bony findings.

## 2014-03-01 ENCOUNTER — Other Ambulatory Visit: Payer: Self-pay | Admitting: Cardiology

## 2014-03-01 DIAGNOSIS — R5383 Other fatigue: Secondary | ICD-10-CM | POA: Diagnosis not present

## 2014-03-01 DIAGNOSIS — I10 Essential (primary) hypertension: Secondary | ICD-10-CM | POA: Diagnosis not present

## 2014-03-01 DIAGNOSIS — G609 Hereditary and idiopathic neuropathy, unspecified: Secondary | ICD-10-CM | POA: Diagnosis not present

## 2014-03-01 DIAGNOSIS — E780 Pure hypercholesterolemia: Secondary | ICD-10-CM | POA: Diagnosis not present

## 2014-03-01 DIAGNOSIS — E1165 Type 2 diabetes mellitus with hyperglycemia: Secondary | ICD-10-CM | POA: Diagnosis not present

## 2014-03-01 NOTE — Telephone Encounter (Signed)
Rx has been sent to the pharmacy electronically. ° °

## 2014-03-19 ENCOUNTER — Ambulatory Visit: Payer: Medicare Other | Admitting: Cardiology

## 2014-04-26 ENCOUNTER — Other Ambulatory Visit: Payer: Self-pay | Admitting: Nurse Practitioner

## 2014-04-26 NOTE — Telephone Encounter (Signed)
Originally prescribed by [employer]

## 2014-05-01 DIAGNOSIS — N952 Postmenopausal atrophic vaginitis: Secondary | ICD-10-CM | POA: Diagnosis not present

## 2014-05-01 DIAGNOSIS — N3020 Other chronic cystitis without hematuria: Secondary | ICD-10-CM | POA: Diagnosis not present

## 2014-05-01 DIAGNOSIS — N182 Chronic kidney disease, stage 2 (mild): Secondary | ICD-10-CM | POA: Diagnosis not present

## 2014-05-01 DIAGNOSIS — N312 Flaccid neuropathic bladder, not elsewhere classified: Secondary | ICD-10-CM | POA: Diagnosis not present

## 2014-05-02 ENCOUNTER — Telehealth: Payer: Self-pay | Admitting: Cardiology

## 2014-05-02 NOTE — Telephone Encounter (Signed)
Received records from [facility] for appointment with [name] on 06/17/14.  Records given to [facility] (medical records) for Dr Allison Quarry schedule on 06/17/14.

## 2014-05-08 ENCOUNTER — Other Ambulatory Visit: Payer: Self-pay | Admitting: Dermatology

## 2014-05-08 DIAGNOSIS — C44729 Squamous cell carcinoma of skin of left lower limb, including hip: Secondary | ICD-10-CM | POA: Diagnosis not present

## 2014-05-08 DIAGNOSIS — D485 Neoplasm of uncertain behavior of skin: Secondary | ICD-10-CM | POA: Diagnosis not present

## 2014-06-04 ENCOUNTER — Ambulatory Visit: Payer: Self-pay | Admitting: General Surgery

## 2014-06-04 DIAGNOSIS — C44729 Squamous cell carcinoma of skin of left lower limb, including hip: Secondary | ICD-10-CM | POA: Diagnosis not present

## 2014-06-06 ENCOUNTER — Encounter (HOSPITAL_BASED_OUTPATIENT_CLINIC_OR_DEPARTMENT_OTHER): Payer: Self-pay | Admitting: *Deleted

## 2014-06-06 NOTE — Progress Notes (Signed)
Pt has mild dementia, but is good historian- She has had several surgeries-cleared for shoulder surgery 10/15 by cardiology- Needs istat-had ekg 9/15 Never smoked, no active chf-dr wyatt ordered cxr-no other need to come to [place]-called his office to see if that can be waved

## 2014-06-07 ENCOUNTER — Ambulatory Visit
Admission: RE | Admit: 2014-06-07 | Discharge: 2014-06-07 | Disposition: A | Discharge status: Home / Self Care | Payer: Medicare Other | Source: Ambulatory Visit | Attending: General Surgery | Admitting: General Surgery

## 2014-06-07 ENCOUNTER — Encounter (HOSPITAL_BASED_OUTPATIENT_CLINIC_OR_DEPARTMENT_OTHER)
Admission: RE | Admit: 2014-06-07 | Discharge: 2014-06-07 | Disposition: A | Discharge status: Home / Self Care | Payer: Medicare Other | Source: Ambulatory Visit | Attending: General Surgery | Admitting: General Surgery

## 2014-06-07 DIAGNOSIS — C44729 Squamous cell carcinoma of skin of left lower limb, including hip: Secondary | ICD-10-CM | POA: Diagnosis not present

## 2014-06-07 DIAGNOSIS — K219 Gastro-esophageal reflux disease without esophagitis: Secondary | ICD-10-CM | POA: Diagnosis not present

## 2014-06-07 DIAGNOSIS — Z01818 Encounter for other preprocedural examination: Secondary | ICD-10-CM | POA: Diagnosis not present

## 2014-06-07 DIAGNOSIS — Z886 Allergy status to analgesic agent status: Secondary | ICD-10-CM | POA: Diagnosis not present

## 2014-06-07 DIAGNOSIS — E780 Pure hypercholesterolemia: Secondary | ICD-10-CM | POA: Diagnosis not present

## 2014-06-07 DIAGNOSIS — E119 Type 2 diabetes mellitus without complications: Secondary | ICD-10-CM | POA: Diagnosis not present

## 2014-06-07 DIAGNOSIS — Z888 Allergy status to other drugs, medicaments and biological substances status: Secondary | ICD-10-CM | POA: Diagnosis not present

## 2014-06-07 DIAGNOSIS — Z7901 Long term (current) use of anticoagulants: Secondary | ICD-10-CM | POA: Diagnosis not present

## 2014-06-07 DIAGNOSIS — I10 Essential (primary) hypertension: Secondary | ICD-10-CM | POA: Diagnosis not present

## 2014-06-07 DIAGNOSIS — Z9049 Acquired absence of other specified parts of digestive tract: Secondary | ICD-10-CM | POA: Diagnosis not present

## 2014-06-07 DIAGNOSIS — Z90710 Acquired absence of both cervix and uterus: Secondary | ICD-10-CM | POA: Diagnosis not present

## 2014-06-07 DIAGNOSIS — Z8673 Personal history of transient ischemic attack (TIA), and cerebral infarction without residual deficits: Secondary | ICD-10-CM | POA: Diagnosis not present

## 2014-06-07 LAB — BASIC METABOLIC PANEL
Anion gap: 11 (ref 5–15)
BUN: 12 mg/dL (ref 6–23)
CO2: 30 mmol/L (ref 19–32)
Calcium: 9.5 mg/dL (ref 8.4–10.5)
Chloride: 93 mmol/L — ABNORMAL LOW (ref 96–112)
Creatinine, Ser: 1.06 mg/dL (ref 0.50–1.10)
GFR calc Af Amer: 58 mL/min — ABNORMAL LOW (ref >90)
GFR calc non Af Amer: 50 mL/min — ABNORMAL LOW (ref >90)
Glucose, Bld: 103 mg/dL — ABNORMAL HIGH (ref 70–99)
Potassium: 4.2 mmol/L (ref 3.5–5.1)
Sodium: 134 mmol/L — ABNORMAL LOW (ref 135–145)

## 2014-06-10 ENCOUNTER — Ambulatory Visit (HOSPITAL_BASED_OUTPATIENT_CLINIC_OR_DEPARTMENT_OTHER): Payer: Medicare Other | Admitting: Anesthesiology

## 2014-06-10 ENCOUNTER — Ambulatory Visit (HOSPITAL_BASED_OUTPATIENT_CLINIC_OR_DEPARTMENT_OTHER)
Admission: RE | Admit: 2014-06-10 | Discharge: 2014-06-10 | Disposition: A | Discharge status: Home / Self Care | Payer: Medicare Other | Source: Ambulatory Visit | Attending: General Surgery | Admitting: General Surgery

## 2014-06-10 ENCOUNTER — Encounter (HOSPITAL_BASED_OUTPATIENT_CLINIC_OR_DEPARTMENT_OTHER): Payer: Self-pay | Admitting: *Deleted

## 2014-06-10 ENCOUNTER — Encounter (HOSPITAL_BASED_OUTPATIENT_CLINIC_OR_DEPARTMENT_OTHER)
Admission: RE | Disposition: A | Discharge status: Home / Self Care | Payer: Self-pay | Source: Ambulatory Visit | Attending: General Surgery

## 2014-06-10 DIAGNOSIS — Z7901 Long term (current) use of anticoagulants: Secondary | ICD-10-CM | POA: Insufficient documentation

## 2014-06-10 DIAGNOSIS — K219 Gastro-esophageal reflux disease without esophagitis: Secondary | ICD-10-CM | POA: Diagnosis not present

## 2014-06-10 DIAGNOSIS — Z90710 Acquired absence of both cervix and uterus: Secondary | ICD-10-CM | POA: Insufficient documentation

## 2014-06-10 DIAGNOSIS — I10 Essential (primary) hypertension: Secondary | ICD-10-CM | POA: Insufficient documentation

## 2014-06-10 DIAGNOSIS — Z9049 Acquired absence of other specified parts of digestive tract: Secondary | ICD-10-CM | POA: Insufficient documentation

## 2014-06-10 DIAGNOSIS — Z886 Allergy status to analgesic agent status: Secondary | ICD-10-CM | POA: Insufficient documentation

## 2014-06-10 DIAGNOSIS — C44729 Squamous cell carcinoma of skin of left lower limb, including hip: Secondary | ICD-10-CM | POA: Diagnosis not present

## 2014-06-10 DIAGNOSIS — C4492 Squamous cell carcinoma of skin, unspecified: Secondary | ICD-10-CM | POA: Diagnosis not present

## 2014-06-10 DIAGNOSIS — Z888 Allergy status to other drugs, medicaments and biological substances status: Secondary | ICD-10-CM | POA: Insufficient documentation

## 2014-06-10 DIAGNOSIS — Z8673 Personal history of transient ischemic attack (TIA), and cerebral infarction without residual deficits: Secondary | ICD-10-CM | POA: Insufficient documentation

## 2014-06-10 DIAGNOSIS — L578 Other skin changes due to chronic exposure to nonionizing radiation: Secondary | ICD-10-CM | POA: Diagnosis not present

## 2014-06-10 DIAGNOSIS — E780 Pure hypercholesterolemia: Secondary | ICD-10-CM | POA: Diagnosis not present

## 2014-06-10 DIAGNOSIS — Z01818 Encounter for other preprocedural examination: Secondary | ICD-10-CM

## 2014-06-10 DIAGNOSIS — E119 Type 2 diabetes mellitus without complications: Secondary | ICD-10-CM | POA: Diagnosis not present

## 2014-06-10 HISTORY — DX: Complete loss of teeth, unspecified cause, unspecified class: K08.109

## 2014-06-10 HISTORY — PX: MASS EXCISION: SHX2000

## 2014-06-10 HISTORY — DX: Complete loss of teeth, unspecified cause, unspecified class: Z97.2

## 2014-06-10 HISTORY — DX: Presence of spectacles and contact lenses: Z97.3

## 2014-06-10 LAB — GLUCOSE, CAPILLARY
Glucose-Capillary: 95 mg/dL (ref 70–99)
Glucose-Capillary: 94 mg/dL (ref 70–99)

## 2014-06-10 LAB — POCT HEMOGLOBIN-HEMACUE
Hemoglobin: 10.6 g/dL — ABNORMAL LOW (ref 12.0–15.0)
Hemoglobin: 10.7 g/dL — ABNORMAL LOW (ref 12.0–15.0)

## 2014-06-10 SURGERY — EXCISION MASS
Anesthesia: Monitor Anesthesia Care | Site: Leg Lower | Laterality: Left

## 2014-06-10 MED ORDER — FENTANYL CITRATE 0.05 MG/ML IJ SOLN
INTRAMUSCULAR | Status: DC | PRN
  Administered 2014-06-10 (×2): 50 ug via INTRAVENOUS

## 2014-06-10 MED ORDER — LIDOCAINE-EPINEPHRINE (PF) 1 %-1:200000 IJ SOLN
INTRAMUSCULAR | Status: AC
  Filled 2014-06-10: qty 10

## 2014-06-10 MED ORDER — FENTANYL CITRATE 0.05 MG/ML IJ SOLN: 50.0000 ug | INTRAMUSCULAR | Status: DC | PRN

## 2014-06-10 MED ORDER — POVIDONE-IODINE 10 % EX OINT
TOPICAL_OINTMENT | CUTANEOUS | Status: AC
Start: 2014-06-10 — End: 2014-06-10
  Filled 2014-06-10: qty 28.35

## 2014-06-10 MED ORDER — FENTANYL CITRATE 0.05 MG/ML IJ SOLN
INTRAMUSCULAR | Status: AC
  Filled 2014-06-10: qty 4

## 2014-06-10 MED ORDER — CEFAZOLIN SODIUM-DEXTROSE 2-3 GM-% IV SOLR
2.0000 g | INTRAVENOUS | Status: AC
  Administered 2014-06-10: 2 g via INTRAVENOUS

## 2014-06-10 MED ORDER — MIDAZOLAM HCL 2 MG/2ML IJ SOLN: 1.0000 mg | INTRAMUSCULAR | Status: DC | PRN

## 2014-06-10 MED ORDER — ONDANSETRON HCL 4 MG/2ML IJ SOLN: 4.0000 mg | Freq: Once | INTRAMUSCULAR | Status: DC | PRN

## 2014-06-10 MED ORDER — LACTATED RINGERS IV SOLN
INTRAVENOUS | Status: DC
  Administered 2014-06-10: 07:00:00 via INTRAVENOUS

## 2014-06-10 MED ORDER — FENTANYL CITRATE 0.05 MG/ML IJ SOLN: 25.0000 ug | INTRAMUSCULAR | Status: DC | PRN

## 2014-06-10 MED ORDER — LIDOCAINE HCL (CARDIAC) 20 MG/ML IV SOLN
INTRAVENOUS | Status: DC | PRN
  Administered 2014-06-10: 50 mg via INTRAVENOUS

## 2014-06-10 MED ORDER — BUPIVACAINE HCL (PF) 0.25 % IJ SOLN
INTRAMUSCULAR | Status: AC
  Filled 2014-06-10: qty 30

## 2014-06-10 MED ORDER — CEFAZOLIN SODIUM-DEXTROSE 2-3 GM-% IV SOLR
INTRAVENOUS | Status: AC
  Filled 2014-06-10: qty 50

## 2014-06-10 MED ORDER — PROPOFOL INFUSION 10 MG/ML OPTIME
INTRAVENOUS | Status: DC | PRN
  Administered 2014-06-10: 100 ug/kg/min via INTRAVENOUS

## 2014-06-10 MED ORDER — POVIDONE-IODINE 10 % EX OINT
TOPICAL_OINTMENT | CUTANEOUS | Status: DC | PRN
  Administered 2014-06-10: 1 via TOPICAL

## 2014-06-10 MED ORDER — LIDOCAINE HCL (PF) 1 % IJ SOLN
INTRAMUSCULAR | Status: AC
  Filled 2014-06-10: qty 30

## 2014-06-10 MED ORDER — MIDAZOLAM HCL 2 MG/2ML IJ SOLN
INTRAMUSCULAR | Status: AC
  Filled 2014-06-10: qty 2

## 2014-06-10 MED ORDER — SODIUM BICARBONATE 4 % IV SOLN
INTRAVENOUS | Status: AC
  Filled 2014-06-10: qty 5

## 2014-06-10 MED ORDER — BUPIVACAINE-EPINEPHRINE (PF) 0.25% -1:200000 IJ SOLN
INTRAMUSCULAR | Status: AC
Start: 2014-06-10 — End: 2014-06-10
  Filled 2014-06-10: qty 30

## 2014-06-10 MED ORDER — OXYCODONE HCL 5 MG PO TABS: 5.0000 mg | ORAL_TABLET | ORAL | Status: DC | PRN

## 2014-06-10 MED ORDER — SODIUM BICARBONATE 4 % IV SOLN
INTRAVENOUS | Status: DC | PRN
  Administered 2014-06-10: 30 mL via SUBCUTANEOUS

## 2014-06-10 MED ORDER — CHLORHEXIDINE GLUCONATE 4 % EX LIQD: 1.0000 "application " | Freq: Once | CUTANEOUS | Status: DC

## 2014-06-10 SURGICAL SUPPLY — 52 items
BLADE CLIPPER SURG (BLADE) IMPLANT
BLADE SURG 15 STRL LF DISP TIS (BLADE) ×1 IMPLANT
BLADE SURG 15 STRL SS (BLADE) ×3
BNDG ADH 5X4 AIR PERM ELC (GAUZE/BANDAGES/DRESSINGS)
BNDG COHESIVE 4X5 WHT NS (GAUZE/BANDAGES/DRESSINGS) IMPLANT
BNDG GAUZE ELAST 4 BULKY (GAUZE/BANDAGES/DRESSINGS) ×2 IMPLANT
CANISTER SUCT 1200ML W/VALVE (MISCELLANEOUS) IMPLANT
CHLORAPREP W/TINT 26ML (MISCELLANEOUS) ×3 IMPLANT
CLEANER CAUTERY TIP 5X5 PAD (MISCELLANEOUS) ×1 IMPLANT
CLOSURE WOUND 1/4X4 (GAUZE/BANDAGES/DRESSINGS)
COVER BACK TABLE 60X90IN (DRAPES) ×3 IMPLANT
COVER MAYO STAND STRL (DRAPES) ×3 IMPLANT
DECANTER SPIKE VIAL GLASS SM (MISCELLANEOUS) ×2 IMPLANT
DRAPE LAPAROTOMY T 102X78X121 (DRAPES) ×1 IMPLANT
DRAPE LAPAROTOMY T 98X78 PEDS (DRAPES) ×2 IMPLANT
DRAPE UTILITY XL STRL (DRAPES) ×4 IMPLANT
DRSG TEGADERM 4X4.75 (GAUZE/BANDAGES/DRESSINGS) ×1 IMPLANT
ELECT REM PT RETURN 9FT ADLT (ELECTROSURGICAL) ×3
ELECTRODE REM PT RTRN 9FT ADLT (ELECTROSURGICAL) ×1 IMPLANT
GLOVE BIOGEL PI IND STRL 8 (GLOVE) ×1 IMPLANT
GLOVE BIOGEL PI INDICATOR 8 (GLOVE) ×2
GLOVE ECLIPSE 6.5 STRL STRAW (GLOVE) ×2 IMPLANT
GLOVE ECLIPSE 7.5 STRL STRAW (GLOVE) ×3 IMPLANT
GLOVE EXAM NITRILE LRG STRL (GLOVE) ×2 IMPLANT
GOWN STRL REUS W/ TWL LRG LVL3 (GOWN DISPOSABLE) ×1 IMPLANT
GOWN STRL REUS W/TWL LRG LVL3 (GOWN DISPOSABLE) ×6
LIQUID BAND (GAUZE/BANDAGES/DRESSINGS) ×1 IMPLANT
NDL HYPO 25X1 1.5 SAFETY (NEEDLE) ×1 IMPLANT
NEEDLE HYPO 25X1 1.5 SAFETY (NEEDLE) ×3 IMPLANT
NS IRRIG 1000ML POUR BTL (IV SOLUTION) ×2 IMPLANT
PACK BASIN DAY SURGERY FS (CUSTOM PROCEDURE TRAY) ×3 IMPLANT
PAD CLEANER CAUTERY TIP 5X5 (MISCELLANEOUS) ×2
PENCIL BUTTON HOLSTER BLD 10FT (ELECTRODE) ×3 IMPLANT
SLEEVE SCD COMPRESS KNEE MED (MISCELLANEOUS) ×2 IMPLANT
SPONGE GAUZE 4X4 12PLY STER LF (GAUZE/BANDAGES/DRESSINGS) ×4 IMPLANT
STRIP CLOSURE SKIN 1/4X4 (GAUZE/BANDAGES/DRESSINGS) ×1 IMPLANT
SUCTION FRAZIER TIP 10 FR DISP (SUCTIONS) IMPLANT
SUT ETHILON 4 0 PS 2 18 (SUTURE) ×5 IMPLANT
SUT MON AB 4-0 PC3 18 (SUTURE) ×1 IMPLANT
SUT PROLENE 4 0 PS 2 18 (SUTURE) IMPLANT
SUT VIC AB 2-0 SH 27 (SUTURE)
SUT VIC AB 2-0 SH 27XBRD (SUTURE) ×2 IMPLANT
SUT VIC AB 4-0 SH 27 (SUTURE) ×3
SUT VIC AB 4-0 SH 27XANBCTRL (SUTURE) IMPLANT
SYR BULB 3OZ (MISCELLANEOUS) IMPLANT
SYR CONTROL 10ML LL (SYRINGE) ×3 IMPLANT
TOWEL OR 17X24 6PK STRL BLUE (TOWEL DISPOSABLE) ×4 IMPLANT
TOWEL OR NON WOVEN STRL DISP B (DISPOSABLE) ×1 IMPLANT
TUBE CONNECTING 20'X1/4 (TUBING)
TUBE CONNECTING 20X1/4 (TUBING) IMPLANT
UNDERPAD 30X30 INCONTINENT (UNDERPADS AND DIAPERS) ×2 IMPLANT
YANKAUER SUCT BULB TIP NO VENT (SUCTIONS) IMPLANT

## 2014-06-10 NOTE — Anesthesia Postprocedure Evaluation (Signed)
Anesthesia Post Note  Patient: [name]  Procedure(s) Performed: Procedure(s) (LRB): EXCISION LEFT LOWER LEG LESION (Left)  Anesthesia type: MAC  Patient location: PACU  Post pain: Pain level controlled and Adequate analgesia  Post assessment: Post-op Vital signs reviewed, Patient's Cardiovascular Status Stable and Respiratory Function Stable  Last Vitals:  Filed Vitals:   06/10/14 0859  BP: 152/87  Pulse: 77  Temp:   Resp: 20    Post vital signs: Reviewed and stable  Level of consciousness: awake, alert  and oriented  Complications: No apparent anesthesia complications

## 2014-06-10 NOTE — Transfer of Care (Signed)
Immediate Anesthesia Transfer of Care Note  Patient: [name]  Procedure(s) Performed: Procedure(s): EXCISION LEFT LOWER LEG LESION (Left)  Patient Location: PACU  Anesthesia Type:MAC  Level of Consciousness: awake, alert  and patient cooperative  Airway & Oxygen Therapy: Patient Spontanous Breathing and Patient connected to face mask oxygen  Post-op Assessment: Report given to RN and Post -op Vital signs reviewed and stable  Post vital signs: Reviewed and stable  Last Vitals:  Filed Vitals:   06/10/14 0636  BP: 131/83  Pulse: 70  Temp: 36.5 C  Resp: 16    Complications: No apparent anesthesia complications

## 2014-06-10 NOTE — Interval H&P Note (Signed)
History and Physical Interval Note: Preop CXR clear.  No changes.  Area marked.  For OR today. 06/10/2014 7:31 AM  [name]  has presented today for surgery, with the diagnosis of Squamous cell cancer  The various methods of treatment have been discussed with the patient and family. After consideration of risks, benefits and other options for treatment, the patient has consented to  Procedure(s): EXCISION LEFT LOWER LEG LESION (Left) as a surgical intervention .  The patient's history has been reviewed, patient examined, no change in status, stable for surgery.  I have reviewed the patient's chart and labs.  Questions were answered to the patient's satisfaction.     Ki Luckman, JAY

## 2014-06-10 NOTE — Anesthesia Preprocedure Evaluation (Signed)
Anesthesia Evaluation  Patient identified by MRN, date of birth, ID band Patient awake    Reviewed: Allergy & Precautions, NPO status , Patient's Chart, lab work & pertinent test results  Airway Mallampati: II   Neck ROM: full    Dental   Pulmonary          Cardiovascular hypertension, + CAD, + Cardiac Stents and +CHF  EF 55% on recent TTE.   Neuro/Psych  Headaches, Seizures -,  Anxiety  Neuromuscular disease CVA    GI/Hepatic GERD-  ,  Endo/Other  diabetes, Type 2  Renal/GU      Musculoskeletal  (+) Arthritis -,   Abdominal   Peds  Hematology   Anesthesia Other Findings   Reproductive/Obstetrics                             Anesthesia Physical Anesthesia Plan  ASA: III  Anesthesia Plan: MAC   Post-op Pain Management:    Induction: Intravenous  Airway Management Planned: Simple Face Mask  Additional Equipment:   Intra-op Plan:   Post-operative Plan:   Informed Consent: I have reviewed the patients History and Physical, chart, labs and discussed the procedure including the risks, benefits and alternatives for the proposed anesthesia with the patient or authorized representative who has indicated his/her understanding and acceptance.     Plan Discussed with: CRNA, Anesthesiologist and Surgeon  Anesthesia Plan Comments:         Anesthesia Quick Evaluation

## 2014-06-10 NOTE — H&P (Signed)
[name] 06/04/2014 11:11 AM Location: [name] Patient #: [name] DOB: 11-22-1940 Married / Language: Undefined / Race: Undefined Female  History of Present Illness [name] [name]; 06/04/2014 11:14 AM) Patient words: squamous cell LLL.  The patient is a 74 year old female    Other Problems [name], [name]; 06/04/2014 11:12 AM) Arthritis Back Pain Bladder Problems Cancer Cerebrovascular Accident Chest pain Cholelithiasis Diabetes Mellitus Gastroesophageal Reflux Disease High blood pressure Hypercholesterolemia Melanoma Oophorectomy Bilateral. Other disease, cancer, significant illness Vascular Disease  Past Surgical History [name], [name]; 06/04/2014 11:11 AM) Appendectomy Breast Biopsy Left. Cataract [name] Bilateral. Cesarean Section - 1 Colon Polyp Removal - Colonoscopy Gallbladder [name] - Laparoscopic Hysterectomy (not due to cancer) - Complete Knee [name] Left. Shoulder [name] Bilateral.  Diagnostic Studies History [name], [name]; 06/04/2014 11:11 AM) Colonoscopy 1-5 years ago Mammogram 1-3 years ago Pap Smear >5 years ago  Allergies [name], [name]; 06/04/2014 11:15 AM) Lipitor *ANTIHYPERLIPIDEMICS* NSAIDs Reglan *GASTROINTESTINAL AGENTS - MISC.* Ultram *ANALGESICS - OPIOID*  Medication History ([name], [name]; 06/04/2014 11:22 AM) Protonix (40MG  Tablet DR, Oral) Active. Diazepam (5MG  Tablet, Oral) Active. (1/2 tab in am and one at bedtime) Cerefolin NAC (6-2-600MG  Tablet, Oral) Active. Losartan Potassium (50MG  Tablet, bid Oral) Active. Bumetanide (1MG  Tablet, Oral) Active. Plavix (75MG  Tablet, Oral) Active. Depakote (500MG  Tablet DR, Oral) Active. Krill Oil Omega-3 (300MG  Capsule, Oral) Active. Biotin (10MG  Tablet, Oral) Active. MetFORMIN HCl (500MG  Tablet, Oral) Active. Isosorbide Mononitrate ER (60MG  Tablet ER 24HR, Oral) Active. Premarin (0.3MG  Tablet, Oral)  Active. Crestor (10MG  Tablet, Oral) Active. Cod Liver Oil (Oral) Active. C-500 (500MG  Tablet, Oral) Active. D3-1000 (1000UNIT Capsule, Oral) Active. Cranberry Plus Vitamin C (4200-20-3MG -MG-UNIT Capsule, Oral) Active. Gabapentin (100MG  Capsule, Oral) Active. Mirapex (0.25MG  Tablet, Oral) Active. Metoprolol Succinate ER (25MG  Tablet ER 24HR, Oral) Active. Medications Reconciled  Social History [name], [name]; 06/04/2014 11:12 AM) Caffeine use Carbonated beverages, Coffee, Tea. No alcohol use No drug use Tobacco use Never smoker.  Family History [name], [place]; 06/04/2014 11:12 AM) Arthritis Brother, Father, Sister. Breast Cancer Daughter, Sister. Cerebrovascular Accident Brother. Cervical Cancer Sister. Depression Brother, Sister. Diabetes Mellitus Brother, Mother, Sister. Heart Disease Brother, Father, Mother, Sister. Heart disease in female family member before age 70 Heart disease in female family member before age 35 Hypertension Brother, Father, Mother, Sister. Kidney Disease Mother. Migraine Headache Sister. Respiratory Condition Daughter, Father, Sister. Thyroid problems Daughter, Mother, Sister.  Pregnancy / Birth History [name], [place]; 06/04/2014 11:12 AM) Age at menarche 51 years. Contraceptive History Oral contraceptives. Maternal age 34-20 Para 3  Review of Systems ([name]; 06/04/2014 11:12 AM) General Not Present- Appetite Loss, Chills, Fatigue, Fever, Night Sweats, Weight Gain and Weight Loss. Skin Present- Change in Wart/Mole and Dryness. Not Present- Hives, Jaundice, New Lesions, Non-Healing Wounds, Rash and Ulcer. HEENT Present- Wears glasses/contact lenses. Not Present- Earache, Hearing Loss, Hoarseness, Nose Bleed, Oral Ulcers, Ringing in the Ears, Seasonal Allergies, Sinus Pain, Sore Throat, Visual Disturbances and Yellow Eyes. Breast Present- Skin Changes. Not Present- Breast Mass, Breast Pain and Nipple  Discharge. Cardiovascular Present- Swelling of Extremities. Not Present- Chest Pain, Difficulty Breathing Lying Down, Leg Cramps, Palpitations, Rapid Heart Rate and Shortness of Breath. Gastrointestinal Not Present- Abdominal Pain, Bloating, Bloody Stool, Change in Bowel Habits, Chronic diarrhea, Constipation, Difficulty Swallowing, Excessive gas, Gets full quickly at meals, Hemorrhoids, Indigestion, Nausea, Rectal Pain and Vomiting. Female Genitourinary Not Present- Frequency, Nocturia, Painful Urination, Pelvic Pain and Urgency. Musculoskeletal Present- Joint Pain and Swelling of  Extremities. Not Present- Back Pain, Joint Stiffness, Muscle Pain and Muscle Weakness. Neurological Present- Decreased Memory and Numbness. Not Present- Fainting, Headaches, Seizures, Tingling, Tremor, Trouble walking and Weakness. Psychiatric Not Present- Anxiety, Bipolar, Change in Sleep Pattern, Depression, Fearful and Frequent crying. Endocrine Not Present- Cold Intolerance, Excessive Hunger, Hair Changes, Heat Intolerance, Hot flashes and New Diabetes. Hematology Present- Easy Bruising. Not Present- Excessive bleeding, Gland problems, HIV and Persistent Infections.   Vitals ([name] [name]; 06/04/2014 11:13 AM) 06/04/2014 11:13 AM Weight: 173 lb Height: 66in Body Surface Area: 1.91 m Body Mass Index: 27.92 kg/m Temp.: 97.67F(Temporal)  Pulse: 76 (Regular)  BP: 138/74 (Sitting, Left Arm, Standard)    Physical Exam (Kristen Bulkley O. Kristen Skains MD; 06/04/2014 11:46 AM) Integumentary Problem #1 Description - Appearance: Note: circular pigmented lesion. Size - 2-D Measurement - 2.5 cm (Length) and 2.5 cm (Width). Description - Borders - The borders of the lesion are well-defined. Texture - soft. Location - Left Lower Extremity - anterior leg. Note: In the left anterolateral mid tibial area.    Assessment & Plan Kristen Rinks O. Kristen Hable MD; 06/04/2014 11:49 AM) SCC (SQUAMOUS CELL CARCINOMA), LEG, LEFT (173.72   C44.729) Impression: Previously excised SSCa of the LLE, mid tibial, anterolateral. Pigmented now and has gown back, about 2.5cm in size. Plan excision as outpatient. Stop Plavix 5 days prior to [name].

## 2014-06-10 NOTE — Discharge Instructions (Signed)

## 2014-06-10 NOTE — Op Note (Signed)
OPERATIVE REPORT  DATE OF OPERATION: 06/10/2014  PATIENT:  [name]  74 y.o. 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 After an adequate amount of IV sedation was given her left lower extremity was prepped and draped in the usual sterile manner exposing the lateral aspect of the tibial area.  A proper timeout was performed identifying the patient and the procedure to be performed. We marked the area of excision which ended up measuring approximately 7 cm long. We subsequently infiltrated the area with mixed 1% Xylocaine local with epinephrine, and Marcaine 0.25% along with sodium bicarbonate solution. Once we had infiltrated with approximately 18 mL of local anesthetic, we used a #15 blade to make the initial incision into the skin down into the subcutaneous tissue and an oval pattern. We subsequently used electrocautery to excise the tissue down to the fascia of the muscle. Electrocautery was used to obtain adequate hemostasis. Once this was done skin flaps were made circumferentially in order to loosen the repair. We then used interrupted 4-0 Vicryl sutures to reapproximate the subcutaneous tissue and the skin was reapproximated using interrupted simple stitches of 4-0 nylon. A total of 13 sutures were used.  All needle counts, sponge counts, and instrument counts were correct. A sterile dressing was applied.  PATIENT DISPOSITION:   PACU - hemodynamically stable.   Leelynn Whetsel, JAY 3/14/20168:28 AM

## 2014-06-11 ENCOUNTER — Encounter (HOSPITAL_BASED_OUTPATIENT_CLINIC_OR_DEPARTMENT_OTHER): Payer: Self-pay | Admitting: General Surgery

## 2014-06-17 ENCOUNTER — Encounter: Payer: Self-pay | Admitting: Cardiology

## 2014-06-17 ENCOUNTER — Ambulatory Visit (INDEPENDENT_AMBULATORY_CARE_PROVIDER_SITE_OTHER): Payer: Medicare Other | Admitting: Cardiology

## 2014-06-17 VITALS — BP 140/70 | HR 80 | Ht 66.0 in | Wt 171.5 lb

## 2014-06-17 DIAGNOSIS — E785 Hyperlipidemia, unspecified: Secondary | ICD-10-CM | POA: Diagnosis not present

## 2014-06-17 DIAGNOSIS — I2510 Atherosclerotic heart disease of native coronary artery without angina pectoris: Secondary | ICD-10-CM

## 2014-06-17 DIAGNOSIS — Z9861 Coronary angioplasty status: Secondary | ICD-10-CM

## 2014-06-17 DIAGNOSIS — I10 Essential (primary) hypertension: Secondary | ICD-10-CM | POA: Diagnosis not present

## 2014-06-17 DIAGNOSIS — R197 Diarrhea, unspecified: Secondary | ICD-10-CM

## 2014-06-17 DIAGNOSIS — R112 Nausea with vomiting, unspecified: Secondary | ICD-10-CM

## 2014-06-17 DIAGNOSIS — R55 Syncope and collapse: Secondary | ICD-10-CM

## 2014-06-17 DIAGNOSIS — R600 Localized edema: Secondary | ICD-10-CM

## 2014-06-17 NOTE — Progress Notes (Signed)
PCP: Glo Herring., MD  Clinic Note: Chief Complaint  Patient presents with  . ROV 6 months    had cancer removed off left leg 06/11/14, had EKG  . Shortness of Breath    patient reports that she cannot take a deep breath - its not a new problem but it is slightly worsening.   HPI: [name] is a 74 y.o. female with a PMH below who presents today for 6 month follow-up for CAD Last seen in Sept 2015 for Pre-op eval --Shoulder Sgx.. 12/2013 - negative VQ scan Skin Ca removal L Leg  Past Medical History  Diagnosis Date  . CAD S/P percutaneous coronary angioplasty 2007; March 2011    [facility] 3.0 mm 20 mm postdilated 3.6 mm in early mid LAD; status post ISR Cutting Balloon PTCA and March 11 along with PCI of distal mid lesion with a 3.0 mm 12 mm MultiLink vision BMS; the proximal stent causes jailing of SP1 and SP2 with ostial 70-80% lesions  . Diabetes mellitus type 2 with neurological manifestations   . Peripheral neuropathy   . Hypertension, essential, benign   . Dyslipidemia, goal LDL below 70   . Arthritis      osteoarthritis  . Anxiety   . History of hematuria     Followed by Dr. Gaynelle Arabian  . Stroke   . CHF (congestive heart failure)   . Dementia   . Chronic kidney disease   . Seizures   . Headache(784.0)   . Cancer     Melanoma, Squamous cell Carcinoma  . Melanoma 1998    lt foot  . Full dentures   . Wears glasses   . Skin cancer     excisions-several    Prior Cardiac Evaluation and Past Surgical History: Past Surgical History  Procedure Laterality Date  . Abdominal hysterectomy    . Bladder suspension    . Appendectomy    . Cholecystectomy    . Nm myoview ltd  11/16/2011    5 beats a PVC during recovery.  No skin or infarction.  Reached 5 METs, EKG negative for ischemia   . Doppler echocardiography  12/31/2009/July 2015    IsLV size & function, Gr 1 DD w/ mild TR.;; b)09/2013: 55-60%. Grade 1 DD, mild aortic sclerosis  . Carotid doppler  06/24/2009     40-59% R ICA stenosis. No significant ICA stenosis noted  . Cardiac catheterization  02/24/2006    85% stenosis in the proximal portion of LAD-arrangements made for PCI on 02/25/2006  . Cardiac catheterization  02/25/2006    80% LAD lesion stented with a 3x83mm [facility] stent resulting in reduction of 80% lesion to 0% residual  . Cardiac catheterization  04/26/2006    Medical management  . Cardiac catheterization  06/24/2009    60-70% re-stenosis in the proximal LAD. A 3.25x15 cutting balloon, 3 inflations - 14atm-38sec, 13atm-39sec, and 12atm-40sec reduced to less than 10%. 60% stenosis of the mid/distal LAD stented with a 3x88mm Multilink stent.  . Cardiac catheterization  07/09/2009    Medical management  . Total knee arthroplasty  2005    left  . Shoulder arthroscopy  10/15  . Shoulder arthroscopy  1995    left  . 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 Mass excision Left 06/10/2014    Procedure: EXCISION LEFT LOWER LEG LESION;  Surgeon: Doreen Salvage, MD;  Location: [facility];  Service: General;  Laterality: Left;  Interval History: She is actually doing quite well today without any real CV complaints.  Has occasional spells of "gasping for breath" but no associated CP.  Not associated with any particular activity.  Post -op 6 lb weight loss - watching diet & trying to be active/exercise.   2 spells of "passing out"  Since last visit --> 1 after feeling sick for ~ 1 week with N/V & diarrhea, the other was probably more related to stress/anxiety reaction - felt warm & flushed, like the world is closing in. Kept taking diuretic despite not feeling well.   No chest pain or shortness of breath with rest or exertion. No PND, orthopnea with stable bilateral edema -- is concerned about her diuretic dosing.Marland Kitchen  No palpitations,  No TIA/amaurosis fugax symptoms.  ROS: A comprehensive was performed. Review of Systems  Constitutional: Negative for malaise/fatigue.    Respiratory: Negative for shortness of breath and wheezing.   Gastrointestinal: Negative for heartburn, blood in stool and melena.       No further GI bug issues    Genitourinary: Negative for hematuria.  Neurological: Positive for dizziness (occasional positional). Negative for headaches.  Psychiatric/Behavioral: Negative for depression. The patient is nervous/anxious (she thinks that one of her "spells" was a panic attack).   All other systems reviewed and are negative.   Current Outpatient Prescriptions on File Prior to Visit  Medication Sig Dispense Refill  . bumetanide (BUMEX) 1 MG tablet Take 1 mg by mouth daily. Take one and half tab in AM.    . Calcium Carb-Cholecalciferol (CALCIUM 600 + D PO) Take 1 tablet by mouth 2 (two) times daily. Chewable    . cholecalciferol (VITAMIN D) 1000 UNITS tablet Take 1,000 Units by mouth daily.     Marland Kitchen Cod Liver Oil 1000 MG CAPS Take 1,000 mg by mouth daily.    Marland Kitchen CRANBERRY-VITAMIN C PO Take 1 capsule by mouth daily.     . diazepam (VALIUM) 5 MG tablet Take 2.5-5 mg by mouth 2 (two) times daily. Taking 2.5 mg in the morning and 5mg  at bedtime    . diphenhydrAMINE (BENADRYL) 25 mg capsule Take 25 mg by mouth at bedtime as needed for sleep.    . divalproex (DEPAKOTE) 500 MG DR tablet Take 500 mg by mouth daily.    Marland Kitchen estrogens, conjugated, (PREMARIN) 0.3 MG tablet Take 0.3 mg by mouth daily.     Marland Kitchen gabapentin (NEURONTIN) 100 MG capsule Take 100 mg by mouth at bedtime.    . isosorbide mononitrate (IMDUR) 60 MG 24 hr tablet Take 60 mg by mouth See admin instructions. *takes daily except for on wednesdays*    . Krill Oil Omega-3 300 MG CAPS Take 300 mg by mouth daily.    . metFORMIN (GLUCOPHAGE) 500 MG tablet Take 500 mg by mouth 2 (two) times daily with a meal.    . Methylfol-Methylcob-Acetylcyst (CEREFOLIN NAC) 6-2-600 MG TABS Take 1 tablet by mouth daily.    . metoprolol succinate (TOPROL-XL) 25 MG 24 hr tablet Take 25 mg by mouth at bedtime.    .  metroNIDAZOLE (METROCREAM) 0.75 % cream Apply 1 application topically 2 (two) times daily.     . Multiple Vitamins-Minerals (HAIR/SKIN/NAILS PO) Take 1 capsule by mouth every morning.    . nitroGLYCERIN (NITROLINGUAL) 0.4 MG/SPRAY spray Place 1 spray under the tongue every 5 (five) minutes as needed for chest pain.    . Omega-3 Fatty Acids (FISH OIL) 1200 MG CAPS Take 1,200 mg by mouth  2 (two) times daily.    Marland Kitchen oxyCODONE (OXY IR/ROXICODONE) 5 MG immediate release tablet Take 1-2 tablets (5-10 mg total) by mouth every 4 (four) hours as needed for moderate pain, severe pain or breakthrough pain. 40 tablet 0  . Oxycodone HCl 10 MG TABS Take 5 mg by mouth every 4 (four) hours as needed (for pain).    . pantoprazole (PROTONIX) 40 MG tablet TAKE 1 TABLET DAILY 90 tablet 2  . pramipexole (MIRAPEX) 0.25 MG tablet Take 0.25 mg by mouth at bedtime.    . rosuvastatin (CRESTOR) 10 MG tablet Take 10 mg by mouth every evening.      No current facility-administered medications on file prior to visit.   Allergies  Allergen Reactions  . Lipitor [Atorvastatin] Other (See Comments)    Bone and muscle pain  . Nsaids Swelling  . Reglan [Metoclopramide]     Tardive dyskinesia; paradoxical reaction not relieved by benadryl  . Tylenol [Acetaminophen]     "anything with tylenol in it" patient has Stage II Kidney Failure  . Ultram [Tramadol] Other (See Comments)    Pt has seizure activity with this medication  ]  History  Substance Use Topics  . Smoking status: Never Smoker   . Smokeless tobacco: Not on file  . Alcohol Use: No   Family History  Problem Relation Age of Onset  . Diabetes Mother   . Hypertension Mother   . Kidney disease Mother   . Hypertension Father   . Emphysema Father   . Heart attack Father   . Heart disease Father   . Arthritis Sister   . Diabetes Sister   . Hypertension Sister   . Hypertension Brother   . Hyperlipidemia Brother   . Diabetes Brother   . Stroke Brother   .  Diabetes Sister   . Hypertension Sister   . Cancer Sister     Cervical cancer  . Hypertension Brother   . Heart attack Daughter   . Hypertension Daughter   . COPD Daughter   . Cancer Daughter     Breast cancer  . Kidney disease Daughter     Kidney mass    Wt Readings from Last 3 Encounters:  06/17/14 171 lb 8 oz (77.792 kg)  06/10/14 172 lb 9.6 oz (78.291 kg)  01/10/14 177 lb 3.2 oz (80.377 kg)   PHYSICAL EXAM BP 140/70 mmHg  Pulse 80  Ht 5\' 6"  (1.676 m)  Wt 171 lb 8 oz (77.792 kg)  BMI 27.69 kg/m2 Gen: alert, cooperative, appears stated age, no distress and less anxious She is well-groomed and well-nourished -- actually , she is in the spirits that are seen her in. Lungs: CTA B., normal percussion bilaterally and Nonlabored, good air movement  Heart: RRR, S1, S2 normal, no murmur, click, rub or gallop and normal apical impulse  Abdomen: Soft mildly tender along the right flank there was a bruise. Nondistended, normal active bowel sounds. No organomegaly noted.  Extremities: Trace bilateral lower extremity and Left knee has bandages in place also her forearms as well.  Pulses: 2+ and symmetric  Neurologic: Mental status: Alert, oriented, thought content appropriate, affect: Remains labile  Cranial nerves: normal   Adult ECG Report -not done Lab Results  Component Value Date   CREATININE 1.06 06/07/2014   Lab Results  Component Value Date   HGB 10.6* 06/10/2014  reviewed  ASSESSMENT / PLAN: Problem List Items Addressed This Visit    CAD S/P percutaneous coronary angioplasty (Chronic)  PCI with BMS - no active angina. Myoview 2013 was negative.  On BB low dose, Plavix alone, ARB & Imdur; statin -- decided to stay on Imdur      Relevant Medications   losartan (COZAAR) 50 MG tablet   Dyslipidemia, goal LDL below 70 (Chronic)    On statin.  Labs followed by PCP.      Relevant Medications   losartan (COZAAR) 50 MG tablet   Edema of both legs (Chronic)     RE-itererated Bumex sliding scale for weight changes.   Also discussed support hose -- do not want to "over diurese"      Hypertension, essential, benign - Primary (Chronic)    BP stable today - within target range.  No change.   Do not want to be too aggressive.       Relevant Medications   losartan (COZAAR) 50 MG tablet   Nausea vomiting and diarrhea    Related to initial syncopal episode - see above re: holding diuretic & ARB when feeling sick & not eating.      Near syncope   Relevant Medications   losartan (COZAAR) 50 MG tablet   Syncope    I think that both episodes are explainable.  I talked to her about the importance of staying hydrated & not taking diuretics (+/- antihypertensive meds) in setting of illness.  Both spells seem vagal / neurocardiogenic mediated & not arrhythmogneic.      Relevant Medications   losartan (COZAAR) 50 MG tablet      No orders of the defined types were placed in this encounter.   Meds ordered this encounter  Medications  . clopidogrel (PLAVIX) 75 MG tablet    Sig: Take 75 mg by mouth daily.  Marland Kitchen losartan (COZAAR) 50 MG tablet    Sig: Take 50 mg by mouth 2 (two) times daily.     Followup: 6 months    HARDING, Leonie Green, M.D., M.S. Interventional Cardiologist   Pager # [phone]

## 2014-06-17 NOTE — Patient Instructions (Addendum)
Make sure if you feeling bad or hot out side hold your bumex.  No other changes with medication.  Your physician wants you to follow-up in 6 month Dr Hershal Coria min appointment  You will receive a reminder letter in the mail two months in advance. If you don't receive a letter, please call our office to schedule the follow-up appointment.

## 2014-06-19 ENCOUNTER — Encounter: Payer: Self-pay | Admitting: Cardiology

## 2014-06-19 NOTE — Assessment & Plan Note (Signed)
Related to initial syncopal episode - see above re: holding diuretic & ARB when feeling sick & not eating.

## 2014-06-19 NOTE — Assessment & Plan Note (Signed)
I think that both episodes are explainable.  I talked to her about the importance of staying hydrated & not taking diuretics (+/- antihypertensive meds) in setting of illness.  Both spells seem vagal / neurocardiogenic mediated & not arrhythmogneic.

## 2014-06-19 NOTE — Assessment & Plan Note (Signed)
RE-itererated Bumex sliding scale for weight changes.   Also discussed support hose -- do not want to "over diurese"

## 2014-06-19 NOTE — Assessment & Plan Note (Signed)
On statin.  Labs followed by PCP.

## 2014-06-19 NOTE — Assessment & Plan Note (Signed)
PCI with BMS - no active angina. Myoview 2013 was negative.  On BB low dose, Plavix alone, ARB & Imdur; statin -- decided to stay on Imdur

## 2014-06-19 NOTE — Assessment & Plan Note (Signed)
BP stable today - within target range.  No change.   Do not want to be too aggressive.

## 2014-06-27 ENCOUNTER — Other Ambulatory Visit: Payer: Self-pay | Admitting: Cardiology

## 2014-07-06 ENCOUNTER — Other Ambulatory Visit: Payer: Self-pay | Admitting: Cardiology

## 2014-07-09 ENCOUNTER — Other Ambulatory Visit: Payer: Self-pay | Admitting: Cardiology

## 2014-07-09 NOTE — Telephone Encounter (Signed)
Rx(s) sent to pharmacy electronically.  

## 2014-07-12 ENCOUNTER — Encounter: Payer: Self-pay | Admitting: Neurology

## 2014-07-12 ENCOUNTER — Ambulatory Visit (INDEPENDENT_AMBULATORY_CARE_PROVIDER_SITE_OTHER): Payer: Medicare Other | Admitting: Neurology

## 2014-07-12 ENCOUNTER — Ambulatory Visit: Payer: Self-pay | Admitting: Nurse Practitioner

## 2014-07-12 VITALS — BP 162/106 | HR 77 | Wt 174.6 lb

## 2014-07-12 DIAGNOSIS — I2510 Atherosclerotic heart disease of native coronary artery without angina pectoris: Secondary | ICD-10-CM

## 2014-07-12 DIAGNOSIS — G3184 Mild cognitive impairment, so stated: Secondary | ICD-10-CM | POA: Diagnosis not present

## 2014-07-12 DIAGNOSIS — Z9861 Coronary angioplasty status: Secondary | ICD-10-CM

## 2014-07-12 MED ORDER — PRAMIPEXOLE DIHYDROCHLORIDE 0.25 MG PO TABS: 0.5000 mg | ORAL_TABLET | Freq: Every day | ORAL | Status: DC

## 2014-07-12 NOTE — Progress Notes (Signed)
PATIENT: [name] DOB: [birth year]  REASON FOR VISIT: follow up for MCI vs. 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  Update 12/06/2012 (PS): [name] returns for f/u after last vist 06/07/2012. She has stopped cerefolin nac as insurance is not paying for it. Memory loss seems stable but she feels subjectively she was doing better on Cerefolin nac. She recently fell of a tractor in her yard and hurt her foot but is improving. She continues to have poor recent memory but is able to recall later. She decided not to do expedition 3 study upon advise from [name] her cardiologist.   Update 03/08/13 ([place]): [name] returns to office for follow up for memory. She reports that she has some bad days and good days. Her husband agrees that her memory deficit is short-term memory, and that it fluctuates. He states that she is very aware of the days where she is not feeling her best; those days she stays at home and keeps to herself. She states that she has times when she feels "like she is going to leave her body." She denies the feeling as a feeling of panic or a feeling of something bad is about to happen. She has felt very "swimmy-headed" for the last few months. She denies the feeling of the room spinning or that she is in motion.   Update 07/11/13 (LL): [name] returns for follow up for memory problems.  She has been tolerating Cerefolin NAC well, states it gives her energy and her memory is better. MMSE 28/30 today, CDT 4/4, AFT 11, GDS 1.  There is 1 point drop in MMSE since 6 months ago.  She is very bothered by restless legs at night, she states she feels like she must keep moving her legs due to a crawling sensation.  The sensation wakes her up at night also, but she denies "acting-out" dreams.  History is difficult to obtain due to her husband interjecting comments and fighting amongst themselves. Update  01/10/2014 ; she returns for followup after last visit 6 months ago who. She is scheduled in about the same. She continues to have mild short-term memory difficulties unchanged. She has been taking several daily and finds it quite useful. She saw primary physician Dr. Gerarda Fraction who suggested she stop the Depakote. 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 She used to also have some light sensitivity and possibly migrainous phenomena which also seemed to be controlled on Depakote. She underwent right shoulder surgery 2 weeks ago which went well and she has no complaints. She was hospitalized briefly twice for syncopal episodes and she thought to be vasovagal related to a diarrhea episode and dehydration. Update 07/12/2014 : She returns for follow-up after last visit 6 months ago. He is accompanied by her husband . She states some memory difficulties unchanged. She reads a lot particularly on religion and numerous. She has a difficult time remembering the day and the date but has not used good compensatory strategies for this. She remains on Cerefolin which she likes. She has noted increasing symptoms of restless legs and at times has to take 2 tablets on Mirapex which work well. She remains on Depakote as she feels it helps her headaches and vision difficulties. She states her blood pressure is normally quite high but she has some whitecoat hypertension blood pressure is elevated today  at 140/100. REVIEW OF SYSTEMS: Full 14 system review of systems performed and notable only for:   Memory loss, fatigue, light sensitivity, leg swelling, excessive thirst, restless legs, insomnia, frequent waking, difficulty urinating, frequency of urination, joint pain, neck pain, bones, moles, itching, easy bruising, dizziness, headache, weakness, decreased concentration and all other systems   negative  ALLERGIES: Allergies  Allergen Reactions  . Lipitor [Atorvastatin] Other (See Comments)      Bone and muscle pain  . Nsaids Swelling  . Reglan [Metoclopramide]     Tardive dyskinesia; paradoxical reaction not relieved by benadryl  . Tylenol [Acetaminophen]     "anything with tylenol in it" patient has Stage II Kidney Failure  . Ultram [Tramadol] Other (See Comments)    Pt has seizure activity with this medication    HOME MEDICATIONS: Outpatient Prescriptions Prior to Visit  Medication Sig Dispense Refill  . bumetanide (BUMEX) 1 MG tablet Take 1 mg by mouth daily. Take one and half tab in AM.    . Calcium Carb-Cholecalciferol (CALCIUM 600 + D PO) Take 1 tablet by mouth 2 (two) times daily. Chewable    . cholecalciferol (VITAMIN D) 1000 UNITS tablet Take 1,000 Units by mouth daily.     . clopidogrel (PLAVIX) 75 MG tablet Take 1 tablet (75 mg total) by mouth daily. 90 tablet 3  . Cod Liver Oil 1000 MG CAPS Take 1,000 mg by mouth daily.    Marland Kitchen CRANBERRY-VITAMIN C PO Take 1 capsule by mouth daily.     . diazepam (VALIUM) 5 MG tablet Take 2.5-5 mg by mouth 2 (two) times daily. Taking 2.5 mg in the morning and 5mg  at bedtime    . diphenhydrAMINE (BENADRYL) 25 mg capsule Take 25 mg by mouth at bedtime as needed for sleep.    . divalproex (DEPAKOTE) 500 MG DR tablet Take 500 mg by mouth daily.    Marland Kitchen estrogens, conjugated, (PREMARIN) 0.3 MG tablet Take 0.3 mg by mouth daily.     Marland Kitchen gabapentin (NEURONTIN) 100 MG capsule Take 100 mg by mouth at bedtime.    . isosorbide mononitrate (IMDUR) 60 MG 24 hr tablet Take 60 mg by mouth See admin instructions. *takes daily except for on wednesdays*    . Krill Oil Omega-3 300 MG CAPS Take 300 mg by mouth daily.    Marland Kitchen losartan (COZAAR) 50 MG tablet Take 50 mg by mouth 2 (two) times daily.    . metFORMIN (GLUCOPHAGE) 500 MG tablet Take 500 mg by mouth 2 (two) times daily with a meal.    . Methylfol-Methylcob-Acetylcyst (CEREFOLIN NAC) 6-2-600 MG TABS Take 1 tablet by mouth daily.    . metoprolol succinate (TOPROL-XL) 25 MG 24 hr tablet Take 25 mg by  mouth at bedtime.    . metoprolol succinate (TOPROL-XL) 50 MG 24 hr tablet TAKE ONE-HALF (1/2) TABLET DAILY 45 tablet 6  . metroNIDAZOLE (METROCREAM) 0.75 % cream Apply 1 application topically 2 (two) times daily.     . Multiple Vitamins-Minerals (HAIR/SKIN/NAILS PO) Take 1 capsule by mouth every morning.    . nitroGLYCERIN (NITROLINGUAL) 0.4 MG/SPRAY spray Place 1 spray under the tongue every 5 (five) minutes as needed for chest pain.    . Omega-3 Fatty Acids (FISH OIL) 1200 MG CAPS Take 1,200 mg by mouth 2 (two) times daily.    Marland Kitchen oxyCODONE (OXY IR/ROXICODONE) 5 MG immediate release tablet Take 1-2 tablets (5-10 mg total) by mouth every 4 (four) hours as needed for moderate pain, severe pain or  breakthrough pain. 40 tablet 0  . pantoprazole (PROTONIX) 40 MG tablet TAKE 1 TABLET DAILY 90 tablet 2  . rosuvastatin (CRESTOR) 10 MG tablet Take 10 mg by mouth every evening.     . clopidogrel (PLAVIX) 75 MG tablet Take 75 mg by mouth daily.    . isosorbide mononitrate (IMDUR) 60 MG 24 hr tablet TAKE 1 TABLET DAILY 90 tablet 3  . losartan (COZAAR) 25 MG tablet TAKE 1 TABLET DAILY 90 tablet 3  . Oxycodone HCl 10 MG TABS Take 5 mg by mouth every 4 (four) hours as needed (for pain).    . pramipexole (MIRAPEX) 0.25 MG tablet Take 0.25 mg by mouth at bedtime.     No facility-administered medications prior to visit.    PHYSICAL EXAM  Filed Vitals:   07/12/14 1100  BP: 162/106  Pulse: 77  Weight: 174 lb 9.6 oz (79.198 kg)   Body mass index is 28.19 kg/(m^2).  General: Frail elderly Caucasian seated, in no evident distress  Head: head normocephalic and atraumatic. Orohparynx benign  Neck: supple with no carotid or supraclavicular bruits  Cardiovascular: regular rate and rhythm, no murmurs  Musculoskeletal: no deformity  Skin: no rash/petichiae  Vascular: Normal pulses all extremities   Neurologic Exam  Mental Status: Awake and fully alert. Oriented to place and time. Recent and remote memory  intact. Attention span, concentration and fund of knowledge appropriate. Mood and affect appropriate. MMSE 29/30, AFT 11, CDT 4/4, .  (MMSE 30/30.1 deficit in orientation. AFT 14.on last visit)  Cranial Nerves: Pupils equal, briskly reactive to light. Extraocular movements full without nystagmus. Visual fields full to confrontation. Hearing intact. Facial sensation intact. Face, tongue, palate moves normally and symmetrically.  Motor: Normal bulk and tone. Normal strength in all tested extremity muscles. Mild head tremor at rest.  Sensory: intact to touch and pinprick and vibratory.  Coordination: Rapid alternating movements normal in all extremities. Finger-to-nose and heel-to-shin performed accurately bilaterally.  Gait and Station: Arises from chair without difficulty. Stance is normal. Gait demonstrates normal stride length and balance . Able to heel, toe and tandem walk without difficulty.  Reflexes: 1+ and symmetric.  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  Likely restless leg syndrome.  PLAN:  I had a long discussion with the patient with regards to her mild cognitive impairment which appears to be quite stable. Continue Cerefolin daily and participation in cognitively challenging task. Consider adding Resveratrol 200 mg daily for antioxidant effect and benefits with memory loss. Ideal long discussion with the patient and husband regarding ways to enhance memory and to learn compensatory strategies and answered questions. She may increase Mirapex to 0.5 mg at night to help with her restless legs  Antony Contras, MD                                                         Return in about 6 months (around 01/11/2015).  Antony Contras, MD  07/12/2014, 1:51 PM [facility] [address] [place], [place] [address] [phone]  Note:  This document was prepared with digital dictation and possible smart phrase technology. Any transcriptional errors that result from this process are unintentional.

## 2014-07-12 NOTE — Patient Instructions (Addendum)
I had a long discussion with the patient with regards to her mild cognitive impairment which appears to be quite stable. Continue Cerefolin daily and participation in cognitively challenging task. Consider adding Resveratrol 200 mg daily for antioxidant effect and benefits with memory loss. Ideal long discussion with the patient and husband regarding ways to enhance memory and to learn compensatory strategies and answered questions. She may increase Mirapex dose to 0.5 mg at night to help with her restless legs.  Memory Compensation Strategies  1. Use "WARM" strategy.  W= write it down  A= associate it  R= repeat it  M= make a mental note  2.   You can keep a Social worker.  Use a 3-ring notebook with sections for the following: calendar, important names and phone numbers,  medications, doctors' names/phone numbers, lists/reminders, and a section to journal what you did  each day.   3.    Use a calendar to write appointments down.  4.    Write yourself a schedule for the day.  This can be placed on the calendar or in a separate section of the Memory Notebook.  Keeping a  regular schedule can help memory.  5.    Use medication organizer with sections for each day or morning/evening pills.  You may need help loading it  6.    Keep a basket, or pegboard by the door.  Place items that you need to take out with you in the basket or on the pegboard.  You may also want to  include a message board for reminders.  7.    Use sticky notes.  Place sticky notes with reminders in a place where the task is performed.  For example: " turn off the  stove" placed by the stove, "lock the door" placed on the door at eye level, " take your medications" on  the bathroom mirror or by the place where you normally take your medications.  8.    Use alarms/timers.  Use while cooking to remind yourself to check on food or as a reminder to take your medicine, or as a  reminder to make a call, or as a reminder to  perform another task, etc.

## 2014-08-15 DIAGNOSIS — N182 Chronic kidney disease, stage 2 (mild): Secondary | ICD-10-CM | POA: Diagnosis not present

## 2014-08-15 DIAGNOSIS — D649 Anemia, unspecified: Secondary | ICD-10-CM | POA: Diagnosis not present

## 2014-08-17 ENCOUNTER — Other Ambulatory Visit: Payer: Self-pay | Admitting: Cardiology

## 2014-08-19 ENCOUNTER — Other Ambulatory Visit: Payer: Self-pay | Admitting: Nurse Practitioner

## 2014-08-19 NOTE — Telephone Encounter (Signed)
Rx(s) sent to pharmacy electronically.  

## 2014-08-20 DIAGNOSIS — N182 Chronic kidney disease, stage 2 (mild): Secondary | ICD-10-CM | POA: Diagnosis not present

## 2014-08-20 DIAGNOSIS — I10 Essential (primary) hypertension: Secondary | ICD-10-CM | POA: Diagnosis not present

## 2014-08-28 DIAGNOSIS — I10 Essential (primary) hypertension: Secondary | ICD-10-CM | POA: Diagnosis not present

## 2014-08-28 DIAGNOSIS — E780 Pure hypercholesterolemia: Secondary | ICD-10-CM | POA: Diagnosis not present

## 2014-08-28 DIAGNOSIS — E1165 Type 2 diabetes mellitus with hyperglycemia: Secondary | ICD-10-CM | POA: Diagnosis not present

## 2014-09-03 DIAGNOSIS — I10 Essential (primary) hypertension: Secondary | ICD-10-CM | POA: Diagnosis not present

## 2014-09-03 DIAGNOSIS — E780 Pure hypercholesterolemia: Secondary | ICD-10-CM | POA: Diagnosis not present

## 2014-09-03 DIAGNOSIS — G609 Hereditary and idiopathic neuropathy, unspecified: Secondary | ICD-10-CM | POA: Diagnosis not present

## 2014-09-03 DIAGNOSIS — E1165 Type 2 diabetes mellitus with hyperglycemia: Secondary | ICD-10-CM | POA: Diagnosis not present

## 2014-09-06 ENCOUNTER — Ambulatory Visit (INDEPENDENT_AMBULATORY_CARE_PROVIDER_SITE_OTHER): Payer: Medicare Other | Admitting: Ophthalmology

## 2014-09-06 DIAGNOSIS — E119 Type 2 diabetes mellitus without complications: Secondary | ICD-10-CM | POA: Diagnosis not present

## 2014-09-06 DIAGNOSIS — H43813 Vitreous degeneration, bilateral: Secondary | ICD-10-CM

## 2014-09-06 DIAGNOSIS — E11319 Type 2 diabetes mellitus with unspecified diabetic retinopathy without macular edema: Secondary | ICD-10-CM | POA: Diagnosis not present

## 2014-09-06 DIAGNOSIS — H35033 Hypertensive retinopathy, bilateral: Secondary | ICD-10-CM | POA: Diagnosis not present

## 2014-09-06 DIAGNOSIS — E11329 Type 2 diabetes mellitus with mild nonproliferative diabetic retinopathy without macular edema: Secondary | ICD-10-CM

## 2014-09-06 DIAGNOSIS — I10 Essential (primary) hypertension: Secondary | ICD-10-CM | POA: Diagnosis not present

## 2014-09-17 ENCOUNTER — Other Ambulatory Visit (HOSPITAL_COMMUNITY): Payer: Self-pay | Admitting: Internal Medicine

## 2014-09-17 DIAGNOSIS — K219 Gastro-esophageal reflux disease without esophagitis: Secondary | ICD-10-CM | POA: Diagnosis not present

## 2014-09-17 DIAGNOSIS — Z0000 Encounter for general adult medical examination without abnormal findings: Secondary | ICD-10-CM | POA: Diagnosis not present

## 2014-09-17 DIAGNOSIS — E538 Deficiency of other specified B group vitamins: Secondary | ICD-10-CM | POA: Diagnosis not present

## 2014-09-17 DIAGNOSIS — M8580 Other specified disorders of bone density and structure, unspecified site: Secondary | ICD-10-CM

## 2014-09-17 DIAGNOSIS — G629 Polyneuropathy, unspecified: Secondary | ICD-10-CM | POA: Diagnosis not present

## 2014-09-17 DIAGNOSIS — I10 Essential (primary) hypertension: Secondary | ICD-10-CM | POA: Diagnosis not present

## 2014-09-17 DIAGNOSIS — Z6827 Body mass index (BMI) 27.0-27.9, adult: Secondary | ICD-10-CM | POA: Diagnosis not present

## 2014-09-17 DIAGNOSIS — E119 Type 2 diabetes mellitus without complications: Secondary | ICD-10-CM | POA: Diagnosis not present

## 2014-09-17 DIAGNOSIS — E663 Overweight: Secondary | ICD-10-CM | POA: Diagnosis not present

## 2014-09-18 ENCOUNTER — Other Ambulatory Visit: Payer: Self-pay | Admitting: Internal Medicine

## 2014-09-18 DIAGNOSIS — Z1231 Encounter for screening mammogram for malignant neoplasm of breast: Secondary | ICD-10-CM

## 2014-10-03 ENCOUNTER — Other Ambulatory Visit: Payer: Self-pay | Admitting: Neurology

## 2014-10-17 ENCOUNTER — Ambulatory Visit
Admission: RE | Admit: 2014-10-17 | Discharge: 2014-10-17 | Disposition: A | Discharge status: Home / Self Care | Payer: Medicare Other | Source: Ambulatory Visit | Attending: Internal Medicine | Admitting: Internal Medicine

## 2014-10-17 DIAGNOSIS — Z1231 Encounter for screening mammogram for malignant neoplasm of breast: Secondary | ICD-10-CM

## 2014-10-28 ENCOUNTER — Ambulatory Visit (HOSPITAL_COMMUNITY)
Admission: RE | Admit: 2014-10-28 | Discharge: 2014-10-28 | Disposition: A | Discharge status: Home / Self Care | Payer: Medicare Other | Source: Ambulatory Visit | Attending: Internal Medicine | Admitting: Internal Medicine

## 2014-10-28 DIAGNOSIS — E638 Other specified nutritional deficiencies: Secondary | ICD-10-CM | POA: Diagnosis not present

## 2014-10-28 DIAGNOSIS — Z780 Asymptomatic menopausal state: Secondary | ICD-10-CM | POA: Insufficient documentation

## 2014-10-28 DIAGNOSIS — M899 Disorder of bone, unspecified: Secondary | ICD-10-CM | POA: Insufficient documentation

## 2014-10-28 DIAGNOSIS — M8580 Other specified disorders of bone density and structure, unspecified site: Secondary | ICD-10-CM

## 2014-10-28 DIAGNOSIS — M85851 Other specified disorders of bone density and structure, right thigh: Secondary | ICD-10-CM | POA: Diagnosis not present

## 2014-10-31 DIAGNOSIS — E871 Hypo-osmolality and hyponatremia: Secondary | ICD-10-CM | POA: Diagnosis not present

## 2014-11-06 ENCOUNTER — Other Ambulatory Visit: Payer: Self-pay | Admitting: Cardiology

## 2014-11-06 DIAGNOSIS — E871 Hypo-osmolality and hyponatremia: Secondary | ICD-10-CM | POA: Diagnosis not present

## 2014-11-06 NOTE — Telephone Encounter (Signed)
Rx(s) sent to pharmacy electronically.  

## 2014-11-14 DIAGNOSIS — I10 Essential (primary) hypertension: Secondary | ICD-10-CM | POA: Diagnosis not present

## 2014-11-25 DIAGNOSIS — E119 Type 2 diabetes mellitus without complications: Secondary | ICD-10-CM | POA: Diagnosis not present

## 2014-12-19 ENCOUNTER — Ambulatory Visit (INDEPENDENT_AMBULATORY_CARE_PROVIDER_SITE_OTHER): Payer: Medicare Other | Admitting: Cardiology

## 2014-12-19 ENCOUNTER — Encounter: Payer: Self-pay | Admitting: Cardiology

## 2014-12-19 VITALS — BP 110/76 | HR 85 | Ht 66.0 in | Wt 173.8 lb

## 2014-12-19 DIAGNOSIS — I2510 Atherosclerotic heart disease of native coronary artery without angina pectoris: Secondary | ICD-10-CM

## 2014-12-19 DIAGNOSIS — I10 Essential (primary) hypertension: Secondary | ICD-10-CM | POA: Diagnosis not present

## 2014-12-19 DIAGNOSIS — R600 Localized edema: Secondary | ICD-10-CM | POA: Diagnosis not present

## 2014-12-19 DIAGNOSIS — R42 Dizziness and giddiness: Secondary | ICD-10-CM | POA: Diagnosis not present

## 2014-12-19 DIAGNOSIS — E785 Hyperlipidemia, unspecified: Secondary | ICD-10-CM

## 2014-12-19 DIAGNOSIS — Z9861 Coronary angioplasty status: Secondary | ICD-10-CM | POA: Diagnosis not present

## 2014-12-19 DIAGNOSIS — R55 Syncope and collapse: Secondary | ICD-10-CM | POA: Diagnosis not present

## 2014-12-19 NOTE — Progress Notes (Signed)
PCP: Glo Herring., MD Nephrologist: [name]; Endocrinologist, Dr. Chalmers Cater; Neurologist: Dr. Leonie Man  Clinic Note: Chief Complaint  Patient presents with  . Follow-up    feels dizzy thinks it's medication  . Coronary Artery Disease  . Dizziness   HPI: AVIELLA [name] is a 74 y.o. female with a PMH below who presents today for 6 month f/u for CAD. She has progressive dementia with poor memory.  Has episodes "when she feels like she is about to leave her body".[name] was last seen in March 2016 - no real complaints  Recent Hospitalizations: None   Studies Reviewed: None  Interval History: [name] presents today with several small issues but mostly notes sensation of feeling dizzy. She describes some symptoms that are classic for orthostatic dizziness as far as changing when she goes from sitting to standing, but she also describes episodes where she feels motion either of herself oral milligrams underneath her that is not actually happening.  Once & a while - feels like can't catch her breath.  Also on 1 occasion felt some pain in her chest - was @ rest & not exertion. She notes all that on having some lower summary swelling. Usually worse in the left leg. She occasionally wears support stockings but not currently. Apparently she was noted to be hyponatremic recently was managed by her PCP. Her losartan was changed from losartan/HCTZ 100/12.5 mg that she takes one half in the morning and one half in the evening.  Cardiovascular ROS: positive for - chest pain, dyspnea on exertion, edema, rapid heart rate and - The chest pain is atypical, very infrequent rapid heart rates. Only short of breath significant exertion or fleeting episodes of "cannot Catch her breath." negative for - irregular heartbeat, orthopnea, palpitations, paroxysmal nocturnal dyspnea or shortness of breath, syncope/near syncope, TIA/amaurosis fugax symptoms.   Past Medical History  Diagnosis Date  . CAD S/P  percutaneous coronary angioplasty 2007; March 2011    [facility] 3.0 mm 20 mm postdilated 3.6 mm in early mid LAD; status post ISR Cutting Balloon PTCA and March 11 along with PCI of distal mid lesion with a 3.0 mm 12 mm MultiLink vision BMS; the proximal stent causes jailing of SP1 and SP2 with ostial 70-80% lesions  . Diabetes mellitus type 2 with neurological manifestations   . Peripheral neuropathy   . Hypertension, essential, benign   . Dyslipidemia, goal LDL below 70   . Arthritis      osteoarthritis  . Anxiety   . History of hematuria     Followed by Dr. Gaynelle Arabian  . Stroke   . CHF (congestive heart failure)   . Dementia   . Chronic kidney disease   . Seizures   . Headache(784.0)   . Cancer     Melanoma, Squamous cell Carcinoma  . Melanoma 1998    lt foot  . Full dentures   . Wears glasses   . Skin cancer     excisions-several    ROS: A comprehensive was performed. Review of Systems  Neurological: Positive for dizziness. Negative for loss of consciousness.  Psychiatric/Behavioral: Positive for memory loss. The patient is nervous/anxious.   All other systems reviewed and are negative.    Past Surgical History  Procedure Laterality Date  . Abdominal hysterectomy    . Bladder suspension    . Appendectomy    . Cholecystectomy    . Nm myoview ltd  11/16/2011    5 beats a PVC  during recovery.  No skin or infarction.  Reached 5 METs, EKG negative for ischemia   . Doppler echocardiography  12/31/2009/July 2015    IsLV size & function, Gr 1 DD w/ mild TR.;; b)09/2013: 55-60%. Grade 1 DD, mild aortic sclerosis  . Carotid doppler  06/24/2009    40-59% R ICA stenosis. No significant ICA stenosis noted  . Cardiac catheterization  02/24/2006    85% stenosis in the proximal portion of LAD-arrangements made for PCI on 02/25/2006  . Cardiac catheterization  02/25/2006    80% LAD lesion stented with a 3x63mm [facility] stent resulting in reduction of 80% lesion to 0% residual  .  Cardiac catheterization  04/26/2006    Medical management  . Cardiac catheterization  06/24/2009    60-70% re-stenosis in the proximal LAD. A 3.25x15 cutting balloon, 3 inflations - 14atm-38sec, 13atm-39sec, and 12atm-40sec reduced to less than 10%. 60% stenosis of the mid/distal LAD stented with a 3x69mm Multilink stent.  . Cardiac catheterization  07/09/2009    Medical management  . Total knee arthroplasty  2005    left  . Shoulder arthroscopy  10/15  . Shoulder arthroscopy  1995    left  . 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 Mass excision Left 06/10/2014    Procedure: EXCISION LEFT LOWER LEG LESION;  Surgeon: Doreen Salvage, MD;  Location: [facility];  Service: General;  Laterality: Left;   Allergies  Allergen Reactions  . Lipitor [Atorvastatin] Other (See Comments)    Bone and muscle pain  . Nsaids Swelling  . Reglan [Metoclopramide]     Tardive dyskinesia; paradoxical reaction not relieved by benadryl  . Tylenol [Acetaminophen] Other (See Comments)    "anything with tylenol in it" patient has Stage II Kidney Failure  . Ultram [Tramadol] Other (See Comments)    Pt has seizure activity with this medication     Prior to Admission medications   Medication Sig Start Date End Date Taking? Authorizing Provider  diazepam (VALIUM) 5 MG tablet Take 5 mg by mouth every 12 (twelve) hours as needed for anxiety (take one half tablet by mouth in the morning and one tablet at bedtime).   Yes Historical Provider, MD  divalproex (DEPAKOTE ER) 500 MG 24 hr tablet Take 500 mg by mouth daily.   Yes Historical Provider, MD  pantoprazole (PROTONIX) 40 MG tablet Take 40 mg by mouth daily.   Yes Historical Provider, MD  Calcium Carb-Cholecalciferol (CALCIUM 600 + D PO) Take 1 tablet by mouth 2 (two) times daily. Chewable    Historical Provider, MD  cholecalciferol (VITAMIN D) 1000 UNITS tablet Take 1,000 Units by mouth daily.     Historical Provider, MD  clopidogrel (PLAVIX) 75 MG  tablet Take 1 tablet (75 mg total) by mouth daily. 07/09/14   Leonie Man, MD  [facility] Liver Oil 1000 MG CAPS Take 1,000 mg by mouth daily.    Historical Provider, MD  CRANBERRY-VITAMIN C PO Take 1 capsule by mouth daily.     Historical Provider, MD  diphenhydrAMINE (BENADRYL) 25 mg capsule Take 25 mg by mouth at bedtime as needed for sleep.    Historical Provider, MD  estrogens, conjugated, (PREMARIN) 0.3 MG tablet Take 0.3 mg by mouth daily.     Historical Provider, MD  gabapentin (NEURONTIN) 100 MG capsule Take 100 mg by mouth at bedtime.    Historical Provider, MD  isosorbide mononitrate (IMDUR) 60 MG 24 hr tablet Take 60 mg  by mouth See admin instructions. *takes daily except for on wednesdays*    Historical Provider, MD  Astrid Drafts Omega-3 300 MG CAPS Take 300 mg by mouth daily.    Historical Provider, MD  losartan (COZAAR) 50 MG tablet Take 50 mg by mouth 2 (two) times daily.    Historical Provider, MD  metFORMIN (GLUCOPHAGE) 500 MG tablet Take 500 mg by mouth 2 (two) times daily with a meal.    Historical Provider, MD  Methylfol-Methylcob-Acetylcyst (CEREFOLIN NAC) 6-2-600 MG TABS Take 1 tablet by mouth daily.    Historical Provider, MD  Methylfol-Methylcob-Acetylcyst (CEREFOLIN NAC) 6-2-600 MG TABS Take one tablet by mouth one time daily 08/19/14   Garvin Fila, MD  metoprolol succinate (TOPROL-XL) 25 MG 24 hr tablet Take 25 mg by mouth at bedtime.    Historical Provider, MD  metoprolol succinate (TOPROL-XL) 50 MG 24 hr tablet TAKE ONE-HALF (1/2) TABLET DAILY 06/27/14   Leonie Man, MD  metroNIDAZOLE (METROCREAM) 0.75 % cream Apply 1 application topically 2 (two) times daily.     Historical Provider, MD  Multiple Vitamins-Minerals (HAIR/SKIN/NAILS PO) Take 1 capsule by mouth every morning.    Historical Provider, MD  nitroGLYCERIN (NITROLINGUAL) 0.4 MG/SPRAY spray Place 1 spray under the tongue every 5 (five) minutes as needed for chest pain.    Leonie Man, MD  Omega-3 Fatty Acids  (FISH OIL) 1200 MG CAPS Take 1,200 mg by mouth 2 (two) times daily.    Historical Provider, MD  oxyCODONE (OXY IR/ROXICODONE) 5 MG immediate release tablet Take 1-2 tablets (5-10 mg total) by mouth every 4 (four) hours as needed for moderate pain, severe pain or breakthrough pain. 06/10/14   Judeth Horn, MD  pramipexole (MIRAPEX) 0.25 MG tablet Take 2 tablets (0.5 mg total) by mouth at bedtime. 07/12/14   Garvin Fila, MD  rosuvastatin (CRESTOR) 10 MG tablet Take 10 mg by mouth every evening.     Historical Provider, MD     Social History   Social History  . Marital Status: Married    Spouse Name: [name]  . Number of Children: 3  . Years of Education: 11   Occupational History  . retired    Social History Main Topics  . Smoking status: Never Smoker   . Smokeless tobacco: None  . Alcohol Use: No  . Drug Use: None  . Sexual Activity: No   Other Topics Concern  . None   Social History Narrative   She is a married mother of 102, grandmother of 2.  Usually very active and out about the house.  But currently over last 6-8 weeks has been incapacitated.  She never smoked and does not drink alcohol.   Family History  Problem Relation Age of Onset  . Diabetes Mother   . Hypertension Mother   . Kidney disease Mother   . Hypertension Father   . Emphysema Father   . Heart attack Father   . Heart disease Father   . Arthritis Sister   . Diabetes Sister   . Hypertension Sister   . Hypertension Brother   . Hyperlipidemia Brother   . Diabetes Brother   . Stroke Brother   . Diabetes Sister   . Hypertension Sister   . Cancer Sister     Cervical cancer  . Hypertension Brother   . Heart attack Daughter   . Hypertension Daughter   . COPD Daughter   . Cancer Daughter     Breast cancer  .  Kidney disease Daughter     Kidney mass    Wt Readings from Last 3 Encounters:  12/19/14 173 lb 12.8 oz (78.835 kg)  07/12/14 174 lb 9.6 oz (79.198 kg)  06/17/14 171 lb 8 oz (77.792 kg)     PHYSICAL EXAM BP 110/76 mmHg  Pulse 85  Ht 5\' 6"  (1.676 m)  Wt 173 lb 12.8 oz (78.835 kg)  BMI 28.07 kg/m2 Gen: alert, cooperative, appears stated age, no distress and** Lungs: CTA B., normal percussion bilaterally and Nonlabored, good air movement  Heart: RRR, S1, S2 normal, no murmur, click, rub or gallop and normal apical impulse  Abdomen: Soft mildly tender along the right flank there was a bruise. Nondistended, normal active bowel sounds. No organomegaly noted.  Extremities: Trace bilateral lower extremity and Left knee has bandages in place also her forearms as well.  Pulses: 2+ and symmetric  Neurologic: Mental status: Alert, oriented, thought content appropriate, affect: Remains labile  Cranial nerves: normal   Adult ECG Report  Rate: 85 ;  Rhythm: normal sinus rhythm and normal axis & intervals.;   Narrative Interpretation: Normal EKG    Other studies Reviewed: Additional studies/ records that were reviewed today include:  Recent Labs: 11/14/2014  HVA Urine 2.6 0000000 hr; homovanillic Acid (24 hr urine) 1.6 mg/L; VMA Urine 2.3 mg/L, VMA (24 hr) Urine 3.8 mg/24h  P.M. Cortisol 6.7g/dL , A.M. Cortisol 23.6 g/dL ( HIGH); TSH 3.15uIU/mL  Sodium 133 (low but better), potassium 5.2, chloride 94, CO2 33 (high), BUN 19, creatinine 1.1, glucose 106, calcium 10.3   CBC: W8.0, H/H 11.7/34.1, platelet 219  Cholesterol (09/17/14): TC 206, TG 153, HDL 84, LDL 91   ASSESSMENT / PLAN: Problem List Items Addressed This Visit    Near syncope    She didn't comment on any recent fainting spells, just some dizziness that is either vertiginous in nature or potentially orthostatic.      Relevant Medications   bumetanide (BUMEX) 1 MG tablet   losartan-hydrochlorothiazide (HYZAAR) 100-12.5 MG per tablet   metoprolol succinate (TOPROL-XL) 50 MG 24 hr tablet   rosuvastatin (CRESTOR) 20 MG tablet   Essential hypertension (Chronic)    Blood pressure looks good today. With her  having some dizziness that may be related to orthostatic changes, we would allow for permissive hypertension to have systolic blood pressures in the 130-150 mmHg range.      Relevant Medications   bumetanide (BUMEX) 1 MG tablet   losartan-hydrochlorothiazide (HYZAAR) 100-12.5 MG per tablet   metoprolol succinate (TOPROL-XL) 50 MG 24 hr tablet   rosuvastatin (CRESTOR) 20 MG tablet   Edema of both legs (Chronic)    Overall pretty stable. Takes Bumex for diuretic and wears compression stockings as much she can. Discussed sliding scale dosing of diuretic      Dyslipidemia, goal LDL below 70 (Chronic)    On Crestor. Labs just checked recently (by PCP) indicates that she still needs more control of the LDL. Would consider a NMR or other advanced lipid panel to clarify HDL versus LDL particle numbers. She seems to be tolerating the Crestor current dose. If we are still trying to get the LDL down further, would consider either increasing Crestor to 40 mg versus adding Zetia.  I will defer to PCP who has dutifully checked labs frequently.      Relevant Medications   bumetanide (BUMEX) 1 MG tablet   losartan-hydrochlorothiazide (HYZAAR) 100-12.5 MG per tablet   metoprolol succinate (TOPROL-XL) 50 MG 24 hr tablet  rosuvastatin (CRESTOR) 20 MG tablet   Dizziness (Chronic)    Not really sure what to make of these episodes. Sounds awfully vertiginous, but there is some component of orthostatic symptoms. Allow for permissive hypertension. Imdur has been stopped. Monitor dose of ARB/HCTZ. In setting of recent hyponatremia, would consider possibly shifting simply to losartan at slightly lower dose to allow for mild permissive hypertension.       CAD S/P percutaneous coronary angioplasty - Primary (Chronic)    Distant history of PCI with BMS to the LAD. No active anginal symptoms. Negative Myoview in 2013. Remains stable on low-dose beta blocker. Takes Plavix without aspirin for maintenance  prophylaxis. Is now on combination ARB-HCTZ.  She now is no longer taking Imdur.      Relevant Medications   bumetanide (BUMEX) 1 MG tablet   losartan-hydrochlorothiazide (HYZAAR) 100-12.5 MG per tablet   metoprolol succinate (TOPROL-XL) 50 MG 24 hr tablet   rosuvastatin (CRESTOR) 20 MG tablet   Other Relevant Orders   EKG 12-Lead (Completed)      Current medicines are reviewed at length with the patient today. (+/- concerns) none The following changes have been made: No changes Studies Ordered:   Orders Placed This Encounter  Procedures  . EKG [id number]     Systolic B/P [id number] is okay  Ok not to take Imdur if you start having chest pain need to restart  Follow-up with Dr.Malley Hauter in 6 months     Marijayne Rauth, Leonie Green, M.D., M.S. Interventional Cardiologist   Pager # [phone]

## 2014-12-19 NOTE — Patient Instructions (Signed)
Systolic B/P [id number] is okay  Ok not to take Imdur if you start having chest pain need to restart    Your physician wants you to follow-up with Dr.Harding in 6 months. You will receive a reminder letter in the mail two months in advance. If you don't receive a letter, please call our office to schedule the follow-up appointment.

## 2014-12-24 ENCOUNTER — Encounter: Payer: Self-pay | Admitting: Cardiology

## 2014-12-24 NOTE — Assessment & Plan Note (Signed)
Not really sure what to make of these episodes. Sounds awfully vertiginous, but there is some component of orthostatic symptoms. Allow for permissive hypertension. Imdur has been stopped. Monitor dose of ARB/HCTZ. In setting of recent hyponatremia, would consider possibly shifting simply to losartan at slightly lower dose to allow for mild permissive hypertension.

## 2014-12-24 NOTE — Assessment & Plan Note (Addendum)
On Crestor. Labs just checked recently (by PCP) indicates that she still needs more control of the LDL. Would consider a NMR or other advanced lipid panel to clarify HDL versus LDL particle numbers. She seems to be tolerating the Crestor current dose. If we are still trying to get the LDL down further, would consider either increasing Crestor to 40 mg versus adding Zetia.  I will defer to PCP who has dutifully checked labs frequently.

## 2014-12-24 NOTE — Assessment & Plan Note (Signed)
Blood pressure looks good today. With her having some dizziness that may be related to orthostatic changes, we would allow for permissive hypertension to have systolic blood pressures in the 130-150 mmHg range.

## 2014-12-24 NOTE — Assessment & Plan Note (Signed)
Overall pretty stable. Takes Bumex for diuretic and wears compression stockings as much she can. Discussed sliding scale dosing of diuretic

## 2014-12-24 NOTE — Assessment & Plan Note (Signed)
She didn't comment on any recent fainting spells, just some dizziness that is either vertiginous in nature or potentially orthostatic.

## 2014-12-24 NOTE — Assessment & Plan Note (Signed)
Distant history of PCI with BMS to the LAD. No active anginal symptoms. Negative Myoview in 2013. Remains stable on low-dose beta blocker. Takes Plavix without aspirin for maintenance prophylaxis. Is now on combination ARB-HCTZ.  She now is no longer taking Imdur.

## 2015-01-08 ENCOUNTER — Ambulatory Visit (INDEPENDENT_AMBULATORY_CARE_PROVIDER_SITE_OTHER): Payer: Medicare Other | Admitting: Nurse Practitioner

## 2015-01-08 ENCOUNTER — Encounter: Payer: Self-pay | Admitting: Nurse Practitioner

## 2015-01-08 VITALS — BP 150/82 | HR 71 | Ht 65.5 in | Wt 177.0 lb

## 2015-01-08 DIAGNOSIS — Z9861 Coronary angioplasty status: Secondary | ICD-10-CM

## 2015-01-08 DIAGNOSIS — R269 Unspecified abnormalities of gait and mobility: Secondary | ICD-10-CM | POA: Diagnosis not present

## 2015-01-08 DIAGNOSIS — F0390 Unspecified dementia without behavioral disturbance: Secondary | ICD-10-CM | POA: Diagnosis not present

## 2015-01-08 DIAGNOSIS — I10 Essential (primary) hypertension: Secondary | ICD-10-CM | POA: Diagnosis not present

## 2015-01-08 DIAGNOSIS — G3184 Mild cognitive impairment, so stated: Secondary | ICD-10-CM | POA: Diagnosis not present

## 2015-01-08 DIAGNOSIS — I2510 Atherosclerotic heart disease of native coronary artery without angina pectoris: Secondary | ICD-10-CM | POA: Diagnosis not present

## 2015-01-08 MED ORDER — CEREFOLIN NAC 6-2-600 MG PO TABS: 1.0000 | ORAL_TABLET | Freq: Every day | ORAL | Status: DC

## 2015-01-08 MED ORDER — PRAMIPEXOLE DIHYDROCHLORIDE 0.25 MG PO TABS: 0.5000 mg | ORAL_TABLET | Freq: Every day | ORAL | Status: DC

## 2015-01-08 NOTE — Patient Instructions (Signed)
Continue Cerefolin at current dose Continue Mirapex at current dose Memory score is stable F/U in 6 months

## 2015-01-08 NOTE — Progress Notes (Signed)
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  Update 12/06/2012 (PS): [name] returns for f/u after last vist 06/07/2012. She has stopped cerefolin nac as insurance is not paying for it. Memory loss seems stable but she feels subjectively she was doing better on Cerefolin nac. She recently fell of a tractor in her yard and hurt her foot but is improving. She continues to have poor recent memory but is able to recall later. She decided not to do expedition 3 study upon advise from [name] her cardiologist.   Update 01/10/2014 ; she returns for followup after last visit 6 months ago who. She is scheduled in about the same. She continues to have mild short-term memory difficulties unchanged. She has been taking several daily and finds it quite useful. She saw primary physician Dr. Gerarda Fraction who suggested she stop the Depakote. 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 She used to also have some light sensitivity and possibly migrainous phenomena which also seemed to be controlled on Depakote. She underwent right shoulder surgery 2 weeks ago which went well and she has no complaints. She was hospitalized briefly twice for syncopal episodes and she thought to be vasovagal related to a diarrhea episode and dehydration. Update 07/12/2014 : She returns for follow-up after last visit 6 months ago. He is accompanied by her husband . She states some memory difficulties unchanged. She reads a lot particularly on religion. She has a difficult time remembering the day and the date but has not used good compensatory strategies for this. She remains  on Cerefolin which she likes. She has noted increasing symptoms of restless legs and at times has to take 2 tablets on Mirapex which work well. She remains on Depakote as she feels it helps her headaches and vision difficulties. She states her blood pressure is normally quite high but she has some whitecoat hypertension blood pressure is elevated today at 140/100. UPDATE 01/08/15 [name], 74 year old female returns for follow-up. 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 She reports that she has good and bad days when she is not feeling her best she stays at home and keeps to herself. She continues to cook and drive and she reports that she has not gotten lost in familiar places. Her most recent hemoglobin A1c was 5.2 Her cardiologist and her primary care Dr. Gerarda Fraction have suggested that she stop her Depakote as they felt it was causing her syncopal episodes. She claims she did that approximately 5 months ago and has not had any syncopal episodes. She had previously thought Depakote  helped her mood as well as history of migraine phenomena. She denies any recent bad headaches .She remains on Cerefolin which she feels is beneficial for her neuropathy and her memory. She is also on Mirapex for restless legs. Her blood pressure is mildly elevated in the office today 150/82  and she tells me she has been told she has kidney disease.  She returns for reevaluation   REVIEW OF SYSTEMS: Full 14 system review of systems performed and notable only for those listed, all others are neg:  Constitutional: neg  Cardiovascular: Leg swelling Ear/Nose/Throat: neg  Skin: neg Eyes: neg Respiratory: neg Gastroitestinal: neg  Hematology/Lymphatic:  Easy bruising Endocrine: neg Musculoskeletal: joint pain and swelling Allergy/Immunology: neg Neurological: Memory loss Psychiatric: neg Sleep :  Restless legs, insomnia  ALLERGIES: Allergies  Allergen Reactions  . Lipitor [Atorvastatin] Other  (See Comments)    Bone and muscle pain  . Nsaids Swelling  . Reglan [Metoclopramide]     Tardive dyskinesia; paradoxical reaction not relieved by benadryl  . Tylenol [Acetaminophen] Other (See Comments)    "anything with tylenol in it" patient has Stage II Kidney Failure  . Ultram [Tramadol] Other (See Comments)    Pt has seizure activity with this medication    HOME MEDICATIONS: Outpatient Prescriptions Prior to Visit  Medication Sig Dispense Refill  . bumetanide (BUMEX) 1 MG tablet Take 1.5 mg by mouth daily.    . Calcium Carb-Cholecalciferol (CALCIUM 600 + D PO) Take 1 tablet by mouth daily. Chewable    . cholecalciferol (VITAMIN D) 1000 UNITS tablet Take 1,000 Units by mouth daily.     . clopidogrel (PLAVIX) 75 MG tablet Take 1 tablet (75 mg total) by mouth daily. 90 tablet 3  . Cod Liver Oil 1000 MG CAPS Take 1,000 mg by mouth daily.    Marland Kitchen CRANBERRY-VITAMIN C PO Take 1 capsule by mouth daily.     . diazepam (VALIUM) 5 MG tablet Take 5 mg by mouth every 12 (twelve) hours as needed for anxiety (take one half tablet by mouth in the morning and one tablet at bedtime).    . diphenhydrAMINE (BENADRYL) 25 mg capsule Take 25 mg by mouth at bedtime as needed for sleep.    . divalproex (DEPAKOTE ER) 500 MG 24 hr tablet Take 500 mg by mouth daily.    Marland Kitchen estrogens, conjugated, (PREMARIN) 0.3 MG tablet Take 0.3 mg by mouth daily.     . Fish Oil-Krill Oil (KRILL OIL PLUS PO) Take 900 mg by mouth daily.    Marland Kitchen gabapentin (NEURONTIN) 300 MG capsule Take 300 mg by mouth at bedtime.    . isosorbide mononitrate (IMDUR) 60 MG 24 hr tablet Take 60 mg by mouth See admin instructions. *takes daily except for on wednesdays*    . losartan-hydrochlorothiazide (HYZAAR) 100-12.5 MG per tablet Take 1 tablet by mouth daily. Taking by mouth daily one half tablet in the morning and one half tablet in the evening    . metFORMIN (GLUCOPHAGE) 500 MG tablet Take 500 mg by mouth 2 (two) times daily with a meal.    .  Methylfol-Methylcob-Acetylcyst (CEREFOLIN NAC) 6-2-600 MG TABS Take one tablet by mouth one time daily 90 each 1  . metoprolol succinate (TOPROL-XL) 50 MG 24 hr tablet Take 25 mg by mouth daily. Take one half tablet by mouth at bedtime    . nitroGLYCERIN (NITROLINGUAL) 0.4 MG/SPRAY spray Place 1 spray under the tongue every 5 (five) minutes x 3 doses as needed for chest pain.     . Omega-3 Fatty Acids (FISH OIL) 1200 MG CAPS Take 1,200 mg by mouth 2 (two) times daily.    Marland Kitchen oxyCODONE (OXY IR/ROXICODONE) 5 MG immediate release tablet Take 1-2 tablets (5-10 mg total) by mouth every 4 (four) hours as needed for moderate pain, severe pain or breakthrough pain. 40 tablet 0  . pantoprazole (PROTONIX) 40 MG tablet Take 40 mg by mouth daily.    . pramipexole (MIRAPEX) 0.25 MG tablet Take 2 tablets (0.5 mg total) by mouth at bedtime. 60 tablet 5  . rosuvastatin (CRESTOR) 20  MG tablet Take 20 mg by mouth at bedtime.     No facility-administered medications prior to visit.    PAST MEDICAL HISTORY: Past Medical History  Diagnosis Date  . CAD S/P percutaneous coronary angioplasty 2007; March 2011    [facility] 3.0 mm 20 mm postdilated 3.6 mm in early mid LAD; status post ISR Cutting Balloon PTCA and March 11 along with PCI of distal mid lesion with a 3.0 mm 12 mm MultiLink vision BMS; the proximal stent causes jailing of SP1 and SP2 with ostial 70-80% lesions  . Diabetes mellitus type 2 with neurological manifestations ([place])   . Peripheral neuropathy ([place])   . Hypertension, essential, benign   . Dyslipidemia, goal LDL below 70   . Arthritis      osteoarthritis  . Anxiety   . History of hematuria     Followed by Dr. Gaynelle Arabian  . Stroke ([place])   . CHF (congestive heart failure) ([place])   . Dementia   . Chronic kidney disease   . Seizures ([place])   . Headache(784.0)   . Cancer (HCC)     Melanoma, Squamous cell Carcinoma  . Melanoma ([place]) 1998    lt foot  . Full dentures   . Wears glasses   . Skin  cancer     excisions-several    PAST SURGICAL HISTORY: Past Surgical History  Procedure Laterality Date  . Abdominal hysterectomy    . Bladder suspension    . Appendectomy    . Cholecystectomy    . Nm myoview ltd  11/16/2011    5 beats a PVC during recovery.  No skin or infarction.  Reached 5 METs, EKG negative for ischemia   . Doppler echocardiography  12/31/2009/July 2015    IsLV size & function, Gr 1 DD w/ mild TR.;; b)09/2013: 55-60%. Grade 1 DD, mild aortic sclerosis  . Carotid doppler  06/24/2009    40-59% R ICA stenosis. No significant ICA stenosis noted  . Cardiac catheterization  02/24/2006    85% stenosis in the proximal portion of LAD-arrangements made for PCI on 02/25/2006  . Cardiac catheterization  02/25/2006    80% LAD lesion stented with a 3x50mm Liberte stent resulting in reduction of 80% lesion to 0% residual  . Cardiac catheterization  04/26/2006    Medical management  . Cardiac catheterization  06/24/2009    60-70% re-stenosis in the proximal LAD. A 3.25x15 cutting balloon, 3 inflations - 14atm-38sec, 13atm-39sec, and 12atm-40sec reduced to less than 10%. 60% stenosis of the mid/distal LAD stented with a 3x80mm Multilink stent.  . Cardiac catheterization  07/09/2009    Medical management  . Total knee arthroplasty  2005    left  . Shoulder arthroscopy  10/15  . Shoulder arthroscopy  1995    left  . 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 Mass excision Left 06/10/2014    Procedure: EXCISION LEFT LOWER LEG LESION;  Surgeon: Doreen Salvage, MD;  Location: [facility];  Service: General;  Laterality: Left;    FAMILY HISTORY: Family History  Problem Relation Age of Onset  . Diabetes Mother   . Hypertension Mother   . Kidney disease Mother   . Hypertension Father   . Emphysema Father   . Heart attack Father   . Heart disease Father   . Arthritis Sister   . Diabetes Sister   . Hypertension Sister   . Hypertension Brother   . Hyperlipidemia  Brother   .  Diabetes Brother   . Stroke Brother   . Diabetes Sister   . Hypertension Sister   . Cancer Sister     Cervical cancer  . Hypertension Brother   . Heart attack Daughter   . Hypertension Daughter   . COPD Daughter   . Cancer Daughter     Breast cancer  . Kidney disease Daughter     Kidney mass    SOCIAL HISTORY: Social History   Social History  . Marital Status: Married    Spouse Name: [name]  . Number of Children: 3  . Years of Education: 11   Occupational History  . retired    Social History Main Topics  . Smoking status: Never Smoker   . Smokeless tobacco: Not on file  . Alcohol Use: No  . Drug Use: Not on file  . Sexual Activity: No   Other Topics Concern  . Not on file   Social History Narrative   She is a married mother of 79, grandmother of 2.  Usually very active and out about the house.  But currently over last 6-8 weeks has been incapacitated.  She never smoked and does not drink alcohol.     PHYSICAL EXAM  Filed Vitals:   01/08/15 0812  BP: 150/82  Pulse: 71  Height: 5' 5.5" (1.664 m)  Weight: 177 lb (80.287 kg)   Body mass index is 29 kg/(m^2). General: Frail elderly Caucasian seated, in no evident distress  Head: head normocephalic and atraumatic. Orohparynx benign  Neck: supple with no carotid or supraclavicular bruits  Cardiovascular: regular rate and rhythm, no murmurs  Musculoskeletal: no deformity  Skin: no rash/petichiae  Vascular: Normal pulses all extremities   Neurologic Exam  Mental Status: Awake and fully alert. Oriented to place and time. Recent and remote memory intact. Attention span, concentration and fund of knowledge appropriate. Mood and affect appropriate. MMSE 29/30, AFT 14, CDT 4/4,   Cranial Nerves: Pupils equal, briskly reactive to light. Extraocular movements full without nystagmus. Visual fields full to confrontation. Hearing intact. Facial sensation intact. Face, tongue, palate moves normally and  symmetrically.  Motor: Normal bulk and tone. Normal strength in all tested extremity muscles. Mild head tremor at rest.  Sensory: intact to touch , decreased pinprick to midshin decreased vibratory to ankles.  Coordination: Rapid alternating movements normal in all extremities. Finger-to-nose and heel-to-shin performed accurately bilaterally.  Gait and Station: Arises from chair without difficulty. Stance is normal. Gait demonstrates normal stride length and balance . Able to heel, toe and  unsteady with tandem walk .No assistive device Reflexes: 1+ and symmetric.  DIAGNOSTIC DATA (LABS, IMAGING, TESTING) - I reviewed patient records, labs, notes, testing and imaging myself where available.      Component Value Date/Time   NA 134* 06/07/2014 1500   K 4.2 06/07/2014 1500   CL 93* 06/07/2014 1500   CO2 30 06/07/2014 1500   GLUCOSE 103* 06/07/2014 1500   BUN 12 06/07/2014 1500   CREATININE 1.06 06/07/2014 1500   CREATININE 0.94 01/09/2013 0839   CALCIUM 9.5 06/07/2014 1500   PROT 6.4 10/07/2013 0526   ALBUMIN 3.0* 10/07/2013 0526   AST 22 10/07/2013 0526   ALT 11 10/07/2013 0526   ALKPHOS 56 10/07/2013 0526   BILITOT <0.2* 10/07/2013 0526   GFRNONAA 50* 06/07/2014 1500   GFRAA 58* 06/07/2014 1500    ASSESSMENT AND PLAN  74 y.o. 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 Likely restless leg syndrome, Peripheral neuropathy (HCC)   Dementia; Chronic kidney disease;  Headache(784.0); gait abnormality here to follow up.   Reviewed previous records Continue Cerefolin at current dose this will help your memory  Continue Mirapex at current dose this is for restless legs Continue gabapentin at current dose, this is for peripheral neuropathy and will also help her restless legs Suggested using  a cane for unsteady gait/potential for falls Memory score is stable Discussed with the  patient and husband regarding ways to enhance memory and to learn compensatory strategies and answered questions. Call for further syncopal episodes versus seizure since her Depakote was discontinued  Follow-up in 6 monthsVst time 25 min Dennie Bible, [facility], [facility], [facility] [address] [place], [place] [address] [phone]

## 2015-01-10 NOTE — Progress Notes (Signed)
I agree with the above plan 

## 2015-01-13 ENCOUNTER — Ambulatory Visit: Payer: Self-pay | Admitting: Neurology

## 2015-01-13 ENCOUNTER — Telehealth: Payer: Self-pay | Admitting: Neurology

## 2015-01-13 MED ORDER — PRAMIPEXOLE DIHYDROCHLORIDE 0.25 MG PO TABS: 0.5000 mg | ORAL_TABLET | Freq: Every day | ORAL | Status: DC

## 2015-01-13 NOTE — Telephone Encounter (Signed)
Patient's husband is calling and states that [facility] needs a corrected copy for the medication pramipexole 0.25 mg tablets.  The medication is to be taken 2 X per day so a 90 day Rx should be 180 pills.  Thanks!

## 2015-01-13 NOTE — Telephone Encounter (Signed)
Rx has been resent.  Receipt confirmed by pharmacy.

## 2015-01-16 DIAGNOSIS — D225 Melanocytic nevi of trunk: Secondary | ICD-10-CM | POA: Diagnosis not present

## 2015-01-16 DIAGNOSIS — D0439 Carcinoma in situ of skin of other parts of face: Secondary | ICD-10-CM | POA: Diagnosis not present

## 2015-01-16 DIAGNOSIS — D485 Neoplasm of uncertain behavior of skin: Secondary | ICD-10-CM | POA: Diagnosis not present

## 2015-01-16 DIAGNOSIS — Z85828 Personal history of other malignant neoplasm of skin: Secondary | ICD-10-CM | POA: Diagnosis not present

## 2015-01-16 DIAGNOSIS — L821 Other seborrheic keratosis: Secondary | ICD-10-CM | POA: Diagnosis not present

## 2015-01-16 DIAGNOSIS — D0471 Carcinoma in situ of skin of right lower limb, including hip: Secondary | ICD-10-CM | POA: Diagnosis not present

## 2015-01-16 DIAGNOSIS — L570 Actinic keratosis: Secondary | ICD-10-CM | POA: Diagnosis not present

## 2015-01-16 DIAGNOSIS — L719 Rosacea, unspecified: Secondary | ICD-10-CM | POA: Diagnosis not present

## 2015-01-16 DIAGNOSIS — D044 Carcinoma in situ of skin of scalp and neck: Secondary | ICD-10-CM | POA: Diagnosis not present

## 2015-01-16 DIAGNOSIS — D692 Other nonthrombocytopenic purpura: Secondary | ICD-10-CM | POA: Diagnosis not present

## 2015-01-16 DIAGNOSIS — Z85820 Personal history of malignant melanoma of skin: Secondary | ICD-10-CM | POA: Diagnosis not present

## 2015-01-20 ENCOUNTER — Encounter: Payer: Self-pay | Admitting: Cardiology

## 2015-01-21 ENCOUNTER — Ambulatory Visit: Payer: Self-pay | Admitting: Neurology

## 2015-01-22 DIAGNOSIS — Z23 Encounter for immunization: Secondary | ICD-10-CM | POA: Diagnosis not present

## 2015-02-03 DIAGNOSIS — J0100 Acute maxillary sinusitis, unspecified: Secondary | ICD-10-CM | POA: Diagnosis not present

## 2015-02-03 DIAGNOSIS — J209 Acute bronchitis, unspecified: Secondary | ICD-10-CM | POA: Diagnosis not present

## 2015-02-05 DIAGNOSIS — E782 Mixed hyperlipidemia: Secondary | ICD-10-CM | POA: Diagnosis not present

## 2015-02-05 DIAGNOSIS — Z1389 Encounter for screening for other disorder: Secondary | ICD-10-CM | POA: Diagnosis not present

## 2015-02-05 DIAGNOSIS — J9801 Acute bronchospasm: Secondary | ICD-10-CM | POA: Diagnosis not present

## 2015-02-05 DIAGNOSIS — Z6828 Body mass index (BMI) 28.0-28.9, adult: Secondary | ICD-10-CM | POA: Diagnosis not present

## 2015-02-05 DIAGNOSIS — K219 Gastro-esophageal reflux disease without esophagitis: Secondary | ICD-10-CM | POA: Diagnosis not present

## 2015-02-05 DIAGNOSIS — I10 Essential (primary) hypertension: Secondary | ICD-10-CM | POA: Diagnosis not present

## 2015-02-05 DIAGNOSIS — J209 Acute bronchitis, unspecified: Secondary | ICD-10-CM | POA: Diagnosis not present

## 2015-02-05 DIAGNOSIS — E663 Overweight: Secondary | ICD-10-CM | POA: Diagnosis not present

## 2015-02-18 DIAGNOSIS — C801 Malignant (primary) neoplasm, unspecified: Secondary | ICD-10-CM | POA: Diagnosis not present

## 2015-02-19 ENCOUNTER — Ambulatory Visit: Payer: Self-pay | Admitting: General Surgery

## 2015-03-03 ENCOUNTER — Telehealth: Payer: Self-pay | Admitting: Neurology

## 2015-03-03 NOTE — Telephone Encounter (Signed)
I called and spoke with Ms Shankman.  Says she was able to reach [facility], and they verified she has refills.  They will be shipping meds to her.  Says she will call us back if anything else is needed.

## 2015-03-03 NOTE — Telephone Encounter (Signed)
Pt called sts she is completely out of Methylfol-Methylcob-Acetylcyst (CEREFOLIN NAC) 6-2-600 MG TABS . She was told she would receive automatic refills. She has tried to [employer] today but with no success, she did leave msg on VM. She needs meds today, are there any samples she could get?

## 2015-03-05 ENCOUNTER — Encounter (HOSPITAL_BASED_OUTPATIENT_CLINIC_OR_DEPARTMENT_OTHER): Payer: Self-pay | Admitting: *Deleted

## 2015-03-06 ENCOUNTER — Encounter (HOSPITAL_BASED_OUTPATIENT_CLINIC_OR_DEPARTMENT_OTHER)
Admission: RE | Admit: 2015-03-06 | Discharge: 2015-03-06 | Disposition: A | Discharge status: Home / Self Care | Payer: Medicare Other | Source: Ambulatory Visit | Attending: General Surgery | Admitting: General Surgery

## 2015-03-06 DIAGNOSIS — E1165 Type 2 diabetes mellitus with hyperglycemia: Secondary | ICD-10-CM | POA: Diagnosis not present

## 2015-03-06 DIAGNOSIS — Z01818 Encounter for other preprocedural examination: Secondary | ICD-10-CM | POA: Insufficient documentation

## 2015-03-06 DIAGNOSIS — E7800 Pure hypercholesterolemia, unspecified: Secondary | ICD-10-CM | POA: Diagnosis not present

## 2015-03-06 LAB — CBC WITH DIFFERENTIAL/PLATELET
Basophils Absolute: 0 10*3/uL (ref 0.0–0.1)
Basophils Relative: 0 %
Eosinophils Absolute: 0.2 10*3/uL (ref 0.0–0.7)
Eosinophils Relative: 2 %
HCT: 37.2 % (ref 36.0–46.0)
Hemoglobin: 11.9 g/dL — ABNORMAL LOW (ref 12.0–15.0)
Lymphocytes Relative: 25 %
Lymphs Abs: 2 10*3/uL (ref 0.7–4.0)
MCH: 27.9 pg (ref 26.0–34.0)
MCHC: 32 g/dL (ref 30.0–36.0)
MCV: 87.1 fL (ref 78.0–100.0)
Monocytes Absolute: 0.6 10*3/uL (ref 0.1–1.0)
Monocytes Relative: 7 %
Neutro Abs: 5.2 10*3/uL (ref 1.7–7.7)
Neutrophils Relative %: 66 %
Platelets: 229 10*3/uL (ref 150–400)
RBC: 4.27 MIL/uL (ref 3.87–5.11)
RDW: 14.6 % (ref 11.5–15.5)
WBC: 7.9 10*3/uL (ref 4.0–10.5)

## 2015-03-10 NOTE — Anesthesia Preprocedure Evaluation (Signed)
Anesthesia Evaluation  Patient identified by MRN, date of birth, ID band Patient awake    Reviewed: Allergy & Precautions, NPO status , Patient's Chart, lab work & pertinent test results  Airway Mallampati: II  TM Distance: >3 FB Neck ROM: Full    Dental no notable dental hx.    Pulmonary neg pulmonary ROS,    Pulmonary exam normal breath sounds clear to auscultation       Cardiovascular hypertension, + CAD and + Cardiac Stents  negative cardio ROS Normal cardiovascular exam Rhythm:Regular Rate:Normal     Neuro/Psych CVA negative psych ROS   GI/Hepatic negative GI ROS, Neg liver ROS,   Endo/Other  diabetes  Renal/GU negative Renal ROS  negative genitourinary   Musculoskeletal negative musculoskeletal ROS (+)   Abdominal   Peds negative pediatric ROS (+)  Hematology negative hematology ROS (+)   Anesthesia Other Findings   Reproductive/Obstetrics negative OB ROS                             Anesthesia Physical Anesthesia Plan  ASA: III  Anesthesia Plan: MAC   Post-op Pain Management:    Induction: Intravenous  Airway Management Planned: Simple Face Mask  Additional Equipment:   Intra-op Plan:   Post-operative Plan:   Informed Consent: I have reviewed the patients History and Physical, chart, labs and discussed the procedure including the risks, benefits and alternatives for the proposed anesthesia with the patient or authorized representative who has indicated his/her understanding and acceptance.   Dental advisory given  Plan Discussed with: CRNA and Surgeon  Anesthesia Plan Comments:         Anesthesia Quick Evaluation

## 2015-03-11 ENCOUNTER — Encounter (HOSPITAL_BASED_OUTPATIENT_CLINIC_OR_DEPARTMENT_OTHER)
Admission: RE | Disposition: A | Discharge status: Home / Self Care | Payer: Self-pay | Source: Ambulatory Visit | Attending: General Surgery

## 2015-03-11 ENCOUNTER — Ambulatory Visit (HOSPITAL_BASED_OUTPATIENT_CLINIC_OR_DEPARTMENT_OTHER)
Admission: RE | Admit: 2015-03-11 | Discharge: 2015-03-11 | Disposition: A | Discharge status: Home / Self Care | Payer: Medicare Other | Source: Ambulatory Visit | Attending: General Surgery | Admitting: General Surgery

## 2015-03-11 ENCOUNTER — Ambulatory Visit (HOSPITAL_BASED_OUTPATIENT_CLINIC_OR_DEPARTMENT_OTHER): Payer: Medicare Other | Admitting: Anesthesiology

## 2015-03-11 ENCOUNTER — Encounter (HOSPITAL_BASED_OUTPATIENT_CLINIC_OR_DEPARTMENT_OTHER): Payer: Self-pay | Admitting: Certified Registered"

## 2015-03-11 DIAGNOSIS — E785 Hyperlipidemia, unspecified: Secondary | ICD-10-CM | POA: Diagnosis not present

## 2015-03-11 DIAGNOSIS — D0471 Carcinoma in situ of skin of right lower limb, including hip: Secondary | ICD-10-CM | POA: Diagnosis not present

## 2015-03-11 DIAGNOSIS — I10 Essential (primary) hypertension: Secondary | ICD-10-CM | POA: Insufficient documentation

## 2015-03-11 DIAGNOSIS — Z888 Allergy status to other drugs, medicaments and biological substances status: Secondary | ICD-10-CM | POA: Diagnosis not present

## 2015-03-11 DIAGNOSIS — G629 Polyneuropathy, unspecified: Secondary | ICD-10-CM | POA: Insufficient documentation

## 2015-03-11 DIAGNOSIS — I129 Hypertensive chronic kidney disease with stage 1 through stage 4 chronic kidney disease, or unspecified chronic kidney disease: Secondary | ICD-10-CM | POA: Insufficient documentation

## 2015-03-11 DIAGNOSIS — F0390 Unspecified dementia without behavioral disturbance: Secondary | ICD-10-CM | POA: Diagnosis not present

## 2015-03-11 DIAGNOSIS — Z79899 Other long term (current) drug therapy: Secondary | ICD-10-CM | POA: Insufficient documentation

## 2015-03-11 DIAGNOSIS — I2510 Atherosclerotic heart disease of native coronary artery without angina pectoris: Secondary | ICD-10-CM | POA: Insufficient documentation

## 2015-03-11 DIAGNOSIS — Z886 Allergy status to analgesic agent status: Secondary | ICD-10-CM | POA: Diagnosis not present

## 2015-03-11 DIAGNOSIS — K219 Gastro-esophageal reflux disease without esophagitis: Secondary | ICD-10-CM | POA: Diagnosis not present

## 2015-03-11 DIAGNOSIS — M1990 Unspecified osteoarthritis, unspecified site: Secondary | ICD-10-CM | POA: Insufficient documentation

## 2015-03-11 DIAGNOSIS — Z7902 Long term (current) use of antithrombotics/antiplatelets: Secondary | ICD-10-CM | POA: Diagnosis not present

## 2015-03-11 DIAGNOSIS — N189 Chronic kidney disease, unspecified: Secondary | ICD-10-CM | POA: Insufficient documentation

## 2015-03-11 DIAGNOSIS — Z9049 Acquired absence of other specified parts of digestive tract: Secondary | ICD-10-CM | POA: Diagnosis not present

## 2015-03-11 DIAGNOSIS — Z8673 Personal history of transient ischemic attack (TIA), and cerebral infarction without residual deficits: Secondary | ICD-10-CM | POA: Diagnosis not present

## 2015-03-11 DIAGNOSIS — E119 Type 2 diabetes mellitus without complications: Secondary | ICD-10-CM | POA: Diagnosis not present

## 2015-03-11 DIAGNOSIS — E1149 Type 2 diabetes mellitus with other diabetic neurological complication: Secondary | ICD-10-CM | POA: Diagnosis not present

## 2015-03-11 DIAGNOSIS — I509 Heart failure, unspecified: Secondary | ICD-10-CM | POA: Diagnosis not present

## 2015-03-11 DIAGNOSIS — Z90710 Acquired absence of both cervix and uterus: Secondary | ICD-10-CM | POA: Insufficient documentation

## 2015-03-11 DIAGNOSIS — F419 Anxiety disorder, unspecified: Secondary | ICD-10-CM | POA: Diagnosis not present

## 2015-03-11 DIAGNOSIS — Z85820 Personal history of malignant melanoma of skin: Secondary | ICD-10-CM | POA: Diagnosis not present

## 2015-03-11 DIAGNOSIS — C44722 Squamous cell carcinoma of skin of right lower limb, including hip: Secondary | ICD-10-CM | POA: Diagnosis not present

## 2015-03-11 DIAGNOSIS — Z955 Presence of coronary angioplasty implant and graft: Secondary | ICD-10-CM | POA: Diagnosis not present

## 2015-03-11 HISTORY — DX: Gastro-esophageal reflux disease without esophagitis: K21.9

## 2015-03-11 HISTORY — PX: LESION REMOVAL: SHX5196

## 2015-03-11 LAB — GLUCOSE, CAPILLARY
Glucose-Capillary: 104 mg/dL — ABNORMAL HIGH (ref 65–99)
Glucose-Capillary: 97 mg/dL (ref 65–99)

## 2015-03-11 SURGERY — EXCISION, LESION, LOWER EXTREMITY
Anesthesia: Monitor Anesthesia Care | Site: Leg Lower | Laterality: Right

## 2015-03-11 MED ORDER — PROPOFOL 500 MG/50ML IV EMUL
INTRAVENOUS | Status: AC
Start: 2015-03-11 — End: 2015-03-11
  Filled 2015-03-11: qty 50

## 2015-03-11 MED ORDER — POVIDONE-IODINE 10 % EX OINT
TOPICAL_OINTMENT | CUTANEOUS | Status: AC
  Filled 2015-03-11: qty 28.35

## 2015-03-11 MED ORDER — LIDOCAINE HCL (CARDIAC) 20 MG/ML IV SOLN
INTRAVENOUS | Status: AC
  Filled 2015-03-11: qty 5

## 2015-03-11 MED ORDER — OXYCODONE HCL 5 MG PO TABS: 5.0000 mg | ORAL_TABLET | Freq: Four times a day (QID) | ORAL | Status: DC | PRN

## 2015-03-11 MED ORDER — LIDOCAINE HCL (CARDIAC) 20 MG/ML IV SOLN
INTRAVENOUS | Status: DC | PRN
  Administered 2015-03-11: 60 mg via INTRAVENOUS

## 2015-03-11 MED ORDER — LACTATED RINGERS IV SOLN
INTRAVENOUS | Status: DC
  Administered 2015-03-11: 07:00:00 via INTRAVENOUS

## 2015-03-11 MED ORDER — BUPIVACAINE-EPINEPHRINE 0.25% -1:200000 IJ SOLN
INTRAMUSCULAR | Status: DC | PRN
  Administered 2015-03-11: 5 mL

## 2015-03-11 MED ORDER — GLYCOPYRROLATE 0.2 MG/ML IJ SOLN
0.2000 mg | Freq: Once | INTRAMUSCULAR | Status: DC | PRN
Start: 2015-03-11 — End: 2015-03-11

## 2015-03-11 MED ORDER — FENTANYL CITRATE (PF) 100 MCG/2ML IJ SOLN
INTRAMUSCULAR | Status: AC
  Filled 2015-03-11: qty 2

## 2015-03-11 MED ORDER — CEFAZOLIN SODIUM-DEXTROSE 2-3 GM-% IV SOLR
INTRAVENOUS | Status: AC
  Filled 2015-03-11: qty 50

## 2015-03-11 MED ORDER — CHLORHEXIDINE GLUCONATE 4 % EX LIQD: 1.0000 "application " | Freq: Once | CUTANEOUS | Status: DC

## 2015-03-11 MED ORDER — MIDAZOLAM HCL 2 MG/2ML IJ SOLN: 1.0000 mg | INTRAMUSCULAR | Status: DC | PRN

## 2015-03-11 MED ORDER — FENTANYL CITRATE (PF) 100 MCG/2ML IJ SOLN
50.0000 ug | INTRAMUSCULAR | Status: DC | PRN
  Administered 2015-03-11: 50 ug via INTRAVENOUS

## 2015-03-11 MED ORDER — ONDANSETRON HCL 4 MG/2ML IJ SOLN
INTRAMUSCULAR | Status: DC | PRN
  Administered 2015-03-11: 4 mg via INTRAVENOUS

## 2015-03-11 MED ORDER — LIDOCAINE HCL (PF) 1 % IJ SOLN
INTRAMUSCULAR | Status: AC
  Filled 2015-03-11: qty 30

## 2015-03-11 MED ORDER — BUPIVACAINE-EPINEPHRINE (PF) 0.25% -1:200000 IJ SOLN
INTRAMUSCULAR | Status: AC
  Filled 2015-03-11: qty 30

## 2015-03-11 MED ORDER — SCOPOLAMINE 1 MG/3DAYS TD PT72
1.0000 | MEDICATED_PATCH | Freq: Once | TRANSDERMAL | Status: DC
Start: 2015-03-11 — End: 2015-03-11

## 2015-03-11 MED ORDER — LIDOCAINE HCL (PF) 1 % IJ SOLN
INTRAMUSCULAR | Status: DC | PRN
  Administered 2015-03-11: 5 mL

## 2015-03-11 MED ORDER — ONDANSETRON HCL 4 MG/2ML IJ SOLN
INTRAMUSCULAR | Status: AC
  Filled 2015-03-11: qty 2

## 2015-03-11 MED ORDER — PROPOFOL 500 MG/50ML IV EMUL
INTRAVENOUS | Status: DC | PRN
  Administered 2015-03-11: 75 ug/kg/min via INTRAVENOUS

## 2015-03-11 MED ORDER — CEFAZOLIN SODIUM-DEXTROSE 2-3 GM-% IV SOLR
2.0000 g | INTRAVENOUS | Status: AC
Start: 2015-03-11 — End: 2015-03-11
  Administered 2015-03-11: 2 g via INTRAVENOUS

## 2015-03-11 SURGICAL SUPPLY — 50 items
BLADE CLIPPER SURG (BLADE) IMPLANT
BLADE SURG 15 STRL LF DISP TIS (BLADE) ×1 IMPLANT
BLADE SURG 15 STRL SS (BLADE) ×3
BNDG ADH 5X4 AIR PERM ELC (GAUZE/BANDAGES/DRESSINGS)
BNDG COHESIVE 4X5 WHT NS (GAUZE/BANDAGES/DRESSINGS) IMPLANT
BNDG GAUZE ELAST 4 BULKY (GAUZE/BANDAGES/DRESSINGS) IMPLANT
CANISTER SUCT 1200ML W/VALVE (MISCELLANEOUS) IMPLANT
CHLORAPREP W/TINT 26ML (MISCELLANEOUS) ×3 IMPLANT
CLEANER CAUTERY TIP 5X5 PAD (MISCELLANEOUS) ×1 IMPLANT
CLOSURE WOUND 1/4X4 (GAUZE/BANDAGES/DRESSINGS) ×1
COVER BACK TABLE 60X90IN (DRAPES) ×3 IMPLANT
COVER MAYO STAND STRL (DRAPES) ×3 IMPLANT
DECANTER SPIKE VIAL GLASS SM (MISCELLANEOUS) IMPLANT
DRAPE LAPAROTOMY T 102X78X121 (DRAPES) ×3 IMPLANT
DRAPE UTILITY XL STRL (DRAPES) ×6 IMPLANT
DRSG TEGADERM 4X4.75 (GAUZE/BANDAGES/DRESSINGS) ×1 IMPLANT
ELECT REM PT RETURN 9FT ADLT (ELECTROSURGICAL) ×3
ELECTRODE REM PT RTRN 9FT ADLT (ELECTROSURGICAL) ×1 IMPLANT
GLOVE BIO SURGEON STRL SZ 6.5 (GLOVE) ×1 IMPLANT
GLOVE BIO SURGEONS STRL SZ 6.5 (GLOVE) ×1
GLOVE BIOGEL PI IND STRL 7.0 (GLOVE) IMPLANT
GLOVE BIOGEL PI IND STRL 8 (GLOVE) ×1 IMPLANT
GLOVE BIOGEL PI INDICATOR 7.0 (GLOVE) ×4
GLOVE BIOGEL PI INDICATOR 8 (GLOVE) ×2
GLOVE ECLIPSE 7.5 STRL STRAW (GLOVE) ×3 IMPLANT
GOWN STRL REUS W/ TWL LRG LVL3 (GOWN DISPOSABLE) ×1 IMPLANT
GOWN STRL REUS W/TWL LRG LVL3 (GOWN DISPOSABLE) ×3
LIQUID BAND (GAUZE/BANDAGES/DRESSINGS) ×1 IMPLANT
NDL HYPO 25X1 1.5 SAFETY (NEEDLE) ×1 IMPLANT
NEEDLE HYPO 25X1 1.5 SAFETY (NEEDLE) ×3 IMPLANT
NS IRRIG 1000ML POUR BTL (IV SOLUTION) IMPLANT
PACK BASIN DAY SURGERY FS (CUSTOM PROCEDURE TRAY) ×3 IMPLANT
PAD CLEANER CAUTERY TIP 5X5 (MISCELLANEOUS) ×2
PENCIL BUTTON HOLSTER BLD 10FT (ELECTRODE) ×3 IMPLANT
STRIP CLOSURE SKIN 1/4X4 (GAUZE/BANDAGES/DRESSINGS) ×2 IMPLANT
SUCTION FRAZIER TIP 10 FR DISP (SUCTIONS) IMPLANT
SUT ETHILON 4 0 PS 2 18 (SUTURE) ×3 IMPLANT
SUT MON AB 4-0 PC3 18 (SUTURE) ×3 IMPLANT
SUT PROLENE 4 0 PS 2 18 (SUTURE) IMPLANT
SUT VIC AB 2-0 SH 27 (SUTURE)
SUT VIC AB 2-0 SH 27XBRD (SUTURE) ×2 IMPLANT
SUT VIC AB 4-0 SH 27 (SUTURE)
SUT VIC AB 4-0 SH 27XANBCTRL (SUTURE) IMPLANT
SYR BULB 3OZ (MISCELLANEOUS) ×2 IMPLANT
SYR CONTROL 10ML LL (SYRINGE) ×3 IMPLANT
TOWEL OR 17X24 6PK STRL BLUE (TOWEL DISPOSABLE) ×6 IMPLANT
TOWEL OR NON WOVEN STRL DISP B (DISPOSABLE) ×3 IMPLANT
TUBE CONNECTING 20'X1/4 (TUBING)
TUBE CONNECTING 20X1/4 (TUBING) IMPLANT
YANKAUER SUCT BULB TIP NO VENT (SUCTIONS) IMPLANT

## 2015-03-11 NOTE — Discharge Instructions (Addendum)
Squamous Cell Carcinoma Squamous cell carcinoma is a common form of skin cancer. It begins in the squamous cells in the outer layer of the skin (epidermis). It occurs most often in parts of the body that are frequently exposed to the sun, such as the face, lips, neck, arms, legs, and hands. However, this condition can occur anywhere on the body, including the inside of the mouth, sites of long-term (chronic) scarring, and the anus. If squamous cell carcinoma is treated soon enough, it rarely spreads to other areas of the body (metastasizes). If it is not treated, it can destroy nearby tissues. In rare cases, it can spread to other areas. CAUSES This condition is usually caused by exposure to ultraviolet (UV) light. UV light may come from the sun or from tanning beds. Other causes include:  Exposure to arsenic.  Exposure to radiation.  Exposure to toxic tars and oils.  Certain genetic conditions, such as xeroderma pigmentosum. RISK FACTORS This condition is more likely to develop in:  People who are older than 76 years of age.  People who have fair skin (light complexion).  People who have blonde or red hair.  People who have blue, green, or gray eyes.  People who have childhood freckling.  People who have had sun exposure over long periods of time, especially during childhood.  People who have had repeated sunburns.  People who have a weakened immune system.  People who have been exposed to certain chemicals, such as tar, soot, and arsenic.  People who have chronic inflammatory conditions.  People who have chronic infections.  People who have an HPV (human papillomavirus) infection.  People who have conditions that cause chronic scarring. These can include burn scars, chronic ulcers, heat (thermal) injuries, and radiation.  People who have had psoralen and ultraviolet A (PUVA) treatments.  People who smoke.  People who use tanning beds. SYMPTOMS This condition often  starts as small pink or brown growths on the skin. The growths have a rough surface that may feel like sandpaper. In some cases, the growths are easier to feel than to see. The growths may develop into a sore that does not heal. DIAGNOSIS This condition may be diagnosed with:  A physical exam.  Removal of a tissue sample to be examined under a microscope (biopsy). TREATMENT Treatment for this condition involves removing the cancerous tissue. The method that is used for this depends on the size and location of the tumors, as well as your overall health. Possible treatments include:  Mohs surgery. In this procedure, the cancerous skin cells are removed layer by layer until all of the tumor has been removed.  Surgical removal (excision) of the tumor. This involves removing the entire tumor and a small amount of normal skin that surrounds it.  Cryosurgery. This involves freezing the tumor with liquid nitrogen.  Plastic surgery. The tumor is removed, and healthy skin from another part of the body is used to cover the wound. This may be done for large tumors that are in areas where it is not possible to stretch the nearby skin to sew the edges of the wound together.  Radiation. This may be used for tumors on the face.  Photodynamic therapy. A chemical cream is applied to the skin, and light exposure is used to activate the chemical.  Electrodesiccation and curettage. This involves alternately scraping and burning the tumor while using an electric current to control bleeding. HOME CARE INSTRUCTIONS  Avoid unprotected sun exposure.  Do self-exams as  told by your health care provider. Look for new spots or changes in your skin.  Keep all follow-up visits as told by your health care provider. This is important.  Do not use tobacco products, including cigarettes, chewing tobacco, or e-cigarettes. If you need help quitting, ask your health care provider. PREVENTION  Avoid the sun when it is the  strongest. This is usually between 10:00 a.m. and 4:00 p.m.  When you are out in the sun, use a sunscreen that has a sun protection factor (SPF) of at least 11.  Apply sunscreen at least 30 minutes before exposure to the sun.  Reapply sunscreen every 2-4 hours while you are outside. Also reapply it after swimming and after excessive sweating.  Always wear hats, protective clothing, and UV-blocking sunglasses when you are outdoors.  Do not use tanning beds. SEEK MEDICAL CARE IF:  You notice any new growths or any changes in your skin.  You have had a squamous cell carcinoma tumor removed and you notice a new growth in the same location.  You have had surgery for this lesion on your right leg.  Leave dressing intact for two day.  RTC two weeks for suture removal.    Post Anesthesia Home Care Instructions  Activity: Get plenty of rest for the remainder of the day. A responsible adult should stay with you for 24 hours following the procedure.  For the next 24 hours, DO NOT: -Drive a car -Paediatric nurse -Drink alcoholic beverages -Take any medication unless instructed by your physician -Make any legal decisions or sign important papers.  Meals: Start with liquid foods such as gelatin or soup. Progress to regular foods as tolerated. Avoid greasy, spicy, heavy foods. If nausea and/or vomiting occur, drink only clear liquids until the nausea and/or vomiting subsides. Call your physician if vomiting continues.  Special Instructions/Symptoms: Your throat may feel dry or sore from the anesthesia or the breathing tube placed in your throat during surgery. If this causes discomfort, gargle with warm salt water. The discomfort should disappear within 24 hours.  If you had a scopolamine patch placed behind your ear for the management of post- operative nausea and/or vomiting:  1. The medication in the patch is effective for 72 hours, after which it should be removed.  Wrap patch in a  tissue and discard in the trash. Wash hands thoroughly with soap and water. 2. You may remove the patch earlier than 72 hours if you experience unpleasant side effects which may include dry mouth, dizziness or visual disturbances. 3. Avoid touching the patch. Wash your hands with soap and water after contact with the patch.

## 2015-03-11 NOTE — Anesthesia Procedure Notes (Signed)
Procedure Name: MAC Date/Time: 03/11/2015 7:34 AM Performed by: Deaunna Olarte D Pre-anesthesia Checklist: Patient identified, Emergency Drugs available, Suction available, Patient being monitored and Timeout performed Patient Re-evaluated:Patient Re-evaluated prior to inductionOxygen Delivery Method: Simple face mask

## 2015-03-11 NOTE — Transfer of Care (Signed)
Immediate Anesthesia Transfer of Care Note  Patient: [name]  Procedure(s) Performed: Procedure(s):  EXCISION OF RIGHT PRE TIBIAL LESION (Right)  Patient Location: PACU  Anesthesia Type:MAC  Level of Consciousness: awake, alert , oriented and patient cooperative  Airway & Oxygen Therapy: Patient Spontanous Breathing and Patient connected to face mask oxygen  Post-op Assessment: Report given to RN and Post -op Vital signs reviewed and stable  Post vital signs: Reviewed and stable  Last Vitals:  Filed Vitals:   03/11/15 0632 03/11/15 0800  BP: 135/87   Pulse: 78 75  Temp: 36.6 C   Resp: 16 20    Complications: No apparent anesthesia complications

## 2015-03-11 NOTE — Op Note (Signed)
OPERATIVE REPORT  DATE OF OPERATION: 03/11/2015  PATIENT:  [name]  74 y.o. female  PRE-OPERATIVE DIAGNOSIS:  squamous cell carcinoma right leg  POST-OPERATIVE DIAGNOSIS:  squamous cell carcinoma right leg  PROCEDURE:  Procedure(s):  EXCISION OF RIGHT PRE TIBIAL LESION  SURGEON:  Surgeon(s): Judeth Horn, MD  ASSISTANT: None  ANESTHESIA:   local and MAC  EBL: <20 ml  BLOOD ADMINISTERED: none  DRAINS: none   SPECIMEN:  Source of Specimen:  Excised skin pretibial area right lower extremity  COUNTS CORRECT:  YES  PROCEDURE DETAILS: The patient was taken to the operating room and placed on the table in the supine position. After an adequate amount of intravenous sedation was given, she was prepped and draped in usual sterile manner exposing the pretibial area of her right lower extremity.  A proper timeout was performed identifying the patient and the procedure to be performed. We marked the area of excision which ultimately turned out to be approximately 6-1/2-7 cm long transversely across her pretibial area.  A #15 blade was used to make an incision into the subcutaneous tissue down to the pretibial fascia. Electrocautery was used to completely excise the skin with the associated lesion. Electrocautery was also used to obtain adequate hemostasis.  Once hemostasis was obtained the wound was irrigated with saline solution then the skin was closed using interrupted simple stitches of 3-0 nylon. A total of 9 sutures were needed to close wound. A sterile dressing was applied. All counts were correct including needles, sponges and instruments.  PATIENT DISPOSITION:  PACU - hemodynamically stable.   Gurvir Schrom 12/13/20168:04 AM

## 2015-03-11 NOTE — Anesthesia Postprocedure Evaluation (Signed)
Anesthesia Post Note  Patient: [name]  Procedure(s) Performed: Procedure(s) (LRB):  EXCISION OF RIGHT PRE TIBIAL LESION (Right)  Patient location during evaluation: PACU Anesthesia Type: MAC Level of consciousness: awake and alert Pain management: pain level controlled Vital Signs Assessment: post-procedure vital signs reviewed and stable Respiratory status: spontaneous breathing and respiratory function stable Cardiovascular status: blood pressure returned to baseline and stable Postop Assessment: no signs of nausea or vomiting Anesthetic complications: no    Last Vitals:  Filed Vitals:   03/11/15 0632 03/11/15 0800  BP: 135/87   Pulse: 78 75  Temp: 36.6 C 36.4 C  Resp: 16 20    Last Pain: There were no vitals filed for this visit.               Anely Spiewak S

## 2015-03-11 NOTE — H&P (Signed)
[name] is an 74 y.o. female.   Chief Complaint: Right leg skin cancer HPI: Multiple previous resections of skin cancer including melanoma.  Now with previously biopsied lesion in the pretibial area of the RLE.  Here for re-excision today.  Past Medical History  Diagnosis Date  . CAD S/P percutaneous coronary angioplasty 2007; March 2011    [facility] 3.0 mm 20 mm postdilated 3.6 mm in early mid LAD; status post ISR Cutting Balloon PTCA and March 11 along with PCI of distal mid lesion with a 3.0 mm 12 mm MultiLink vision BMS; the proximal stent causes jailing of SP1 and SP2 with ostial 70-80% lesions  . Diabetes mellitus type 2 with neurological manifestations ([place])   . Peripheral neuropathy ([place])   . Hypertension, essential, benign   . Dyslipidemia, goal LDL below 70   . Arthritis      osteoarthritis  . Anxiety   . History of hematuria     Followed by Dr. Gaynelle Arabian  . Stroke ([place])   . CHF (congestive heart failure) ([place])   . Dementia   . Chronic kidney disease   . Headache(784.0)   . Cancer ([facility])     Melanoma, Squamous cell Carcinoma  . Melanoma ([place]) 1998    lt foot  . Full dentures   . Wears glasses   . Skin cancer     excisions-several  . Shortness of breath dyspnea     with exertion  . GERD (gastroesophageal reflux disease)     Past Surgical History  Procedure Laterality Date  . Abdominal hysterectomy    . Bladder suspension    . Appendectomy    . Cholecystectomy    . Nm myoview ltd  11/16/2011    5 beats a PVC during recovery.  No skin or infarction.  Reached 5 METs, EKG negative for ischemia   . Doppler echocardiography  12/31/2009/July 2015    IsLV size & function, Gr 1 DD w/ mild TR.;; b)09/2013: 55-60%. Grade 1 DD, mild aortic sclerosis  . Carotid doppler  06/24/2009    40-59% R ICA stenosis. No significant ICA stenosis noted  . Cardiac catheterization  02/24/2006    85% stenosis in the proximal portion of LAD-arrangements made for PCI on 02/25/2006  .  Cardiac catheterization  02/25/2006    80% LAD lesion stented with a 3x72mm [facility] stent resulting in reduction of 80% lesion to 0% residual  . Cardiac catheterization  04/26/2006    Medical management  . Cardiac catheterization  06/24/2009    60-70% re-stenosis in the proximal LAD. A 3.25x15 cutting balloon, 3 inflations - 14atm-38sec, 13atm-39sec, and 12atm-40sec reduced to less than 10%. 60% stenosis of the mid/distal LAD stented with a 3x14mm Multilink stent.  . Cardiac catheterization  07/09/2009    Medical management  . Total knee arthroplasty  2005    left  . Shoulder arthroscopy  10/15  . Shoulder arthroscopy  1995    left  . 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 Mass excision Left 06/10/2014    Procedure: EXCISION LEFT LOWER LEG LESION;  Surgeon: Doreen Salvage, MD;  Location: [facility];  Service: General;  Laterality: Left;  . Joint replacement Left     Family History  Problem Relation Age of Onset  . Diabetes Mother   . Hypertension Mother   . Kidney disease Mother   . Hypertension Father   . Emphysema Father   . Heart attack Father   .  Heart disease Father   . Arthritis Sister   . Diabetes Sister   . Hypertension Sister   . Hypertension Brother   . Hyperlipidemia Brother   . Diabetes Brother   . Stroke Brother   . Diabetes Sister   . Hypertension Sister   . Cancer Sister     Cervical cancer  . Hypertension Brother   . Heart attack Daughter   . Hypertension Daughter   . COPD Daughter   . Cancer Daughter     Breast cancer  . Kidney disease Daughter     Kidney mass   Social History:  reports that she has never smoked. She does not have any smokeless tobacco history on file. She reports that she does not drink alcohol or use illicit drugs.  Allergies:  Allergies  Allergen Reactions  . Lipitor [Atorvastatin] Other (See Comments)    Bone and muscle pain  . Nsaids Swelling  . Reglan [Metoclopramide]     Tardive dyskinesia; paradoxical  reaction not relieved by benadryl  . Tylenol [Acetaminophen] Other (See Comments)    "anything with tylenol in it" patient has Stage II Kidney Failure  . Ultram [Tramadol] Other (See Comments)    Pt has seizure activity with this medication    Medications Prior to Admission  Medication Sig Dispense Refill  . bumetanide (BUMEX) 1 MG tablet Take 1.5 mg by mouth daily.    . Calcium Carb-Cholecalciferol (CALCIUM 600 + D PO) Take 1 tablet by mouth daily. Chewable    . cholecalciferol (VITAMIN D) 1000 UNITS tablet Take 1,000 Units by mouth daily.     . clopidogrel (PLAVIX) 75 MG tablet Take 1 tablet (75 mg total) by mouth daily. 90 tablet 3  . Cod Liver Oil 1000 MG CAPS Take 1,000 mg by mouth daily.    Marland Kitchen CRANBERRY-VITAMIN C PO Take 1 capsule by mouth daily.     . diazepam (VALIUM) 5 MG tablet Take 5 mg by mouth every 12 (twelve) hours as needed for anxiety (take one half tablet by mouth in the morning and one tablet at bedtime).    . diphenhydrAMINE (BENADRYL) 25 mg capsule Take 25 mg by mouth at bedtime as needed for sleep.    Marland Kitchen estrogens, conjugated, (PREMARIN) 0.3 MG tablet Take 0.3 mg by mouth daily.     . Fish Oil-Krill Oil (KRILL OIL PLUS PO) Take 900 mg by mouth daily.    Marland Kitchen gabapentin (NEURONTIN) 300 MG capsule Take 300 mg by mouth at bedtime.    Marland Kitchen losartan-hydrochlorothiazide (HYZAAR) 100-12.5 MG per tablet Take 1 tablet by mouth daily. Taking by mouth daily one half tablet in the morning and one half tablet in the evening    . metFORMIN (GLUCOPHAGE) 500 MG tablet Take 500 mg by mouth 2 (two) times daily with a meal.    . Methylfol-Methylcob-Acetylcyst (CEREFOLIN NAC) 6-2-600 MG TABS Take 1 tablet by mouth daily. 90 each 1  . metoprolol succinate (TOPROL-XL) 50 MG 24 hr tablet Take 25 mg by mouth daily. Take one half tablet by mouth at bedtime    . Omega-3 Fatty Acids (FISH OIL) 1200 MG CAPS Take 1,200 mg by mouth 2 (two) times daily.    . pantoprazole (PROTONIX) 40 MG tablet Take 40 mg by  mouth daily.    . pramipexole (MIRAPEX) 0.25 MG tablet Take 2 tablets (0.5 mg total) by mouth at bedtime. 180 tablet 2  . rosuvastatin (CRESTOR) 20 MG tablet Take 20 mg by mouth at bedtime.    Marland Kitchen  nitroGLYCERIN (NITROLINGUAL) 0.4 MG/SPRAY spray Place 1 spray under the tongue every 5 (five) minutes x 3 doses as needed for chest pain.     Marland Kitchen oxyCODONE (OXY IR/ROXICODONE) 5 MG immediate release tablet Take 1-2 tablets (5-10 mg total) by mouth every 4 (four) hours as needed for moderate pain, severe pain or breakthrough pain. 40 tablet 0    Results for orders placed or performed during the hospital encounter of 03/11/15 (from the past 48 hour(s))  Glucose, capillary     Status: Abnormal   Collection Time: 03/11/15  6:46 AM  Result Value Ref Range   Glucose-Capillary 104 (H) 65 - 99 mg/dL   No results found.  ROS  Blood pressure 135/87, pulse 78, temperature 97.9 F (36.6 C), temperature source Oral, resp. rate 16, height 5' 5.5" (1.664 m), weight 79.833 kg (176 lb), SpO2 98 %. Physical Exam  Constitutional: She appears well-developed and well-nourished.  HENT:  Head: Normocephalic.    Eyes: Conjunctivae are normal. Pupils are equal, round, and reactive to light.  Neck: Normal range of motion. Neck supple.  Cardiovascular: Normal rate.   Respiratory: Effort normal and breath sounds normal.  Musculoskeletal:       Legs: Skin: Skin is warm and dry.  Psychiatric: She has a normal mood and affect. Her behavior is normal. Judgment and thought content normal.     Assessment/Plan Skin cancer right lower extremity  Re-excised under MAC.  [name] 03/11/2015, 7:13 AM

## 2015-03-12 ENCOUNTER — Encounter (HOSPITAL_BASED_OUTPATIENT_CLINIC_OR_DEPARTMENT_OTHER): Payer: Self-pay | Admitting: General Surgery

## 2015-03-13 DIAGNOSIS — I10 Essential (primary) hypertension: Secondary | ICD-10-CM | POA: Diagnosis not present

## 2015-03-13 DIAGNOSIS — G609 Hereditary and idiopathic neuropathy, unspecified: Secondary | ICD-10-CM | POA: Diagnosis not present

## 2015-03-13 DIAGNOSIS — E1165 Type 2 diabetes mellitus with hyperglycemia: Secondary | ICD-10-CM | POA: Diagnosis not present

## 2015-03-13 DIAGNOSIS — E7800 Pure hypercholesterolemia, unspecified: Secondary | ICD-10-CM | POA: Diagnosis not present

## 2015-03-31 IMAGING — MR MR HEAD W/O CM
9 of 10 series · 34 of 48 positions shown · non-contrast
Comparison: Head CT 11/21/2009.  MRI 09/26/2009.

CLINICAL DATA: Dementia.  Syncopal episode.

EXAM:
MRI HEAD WITHOUT CONTRAST
TECHNIQUE: Multiplanar, multiecho pulse sequences of the brain and surrounding
structures were obtained without intravenous contrast.

[Series 3: T1 · sagittal · 5.0mm · 0.47mm/px · 2 of 23 slices shown]
[im 1/23]
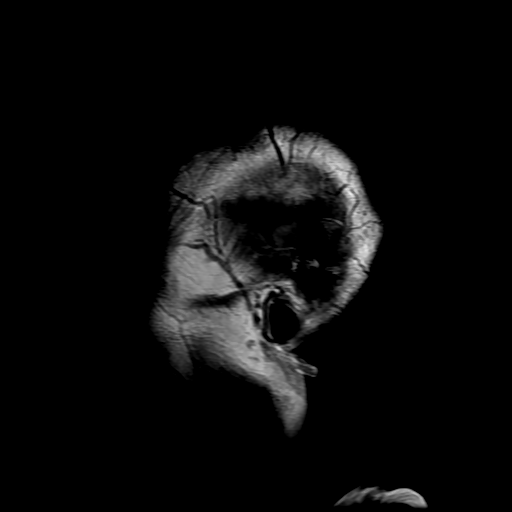
[im 23/23]
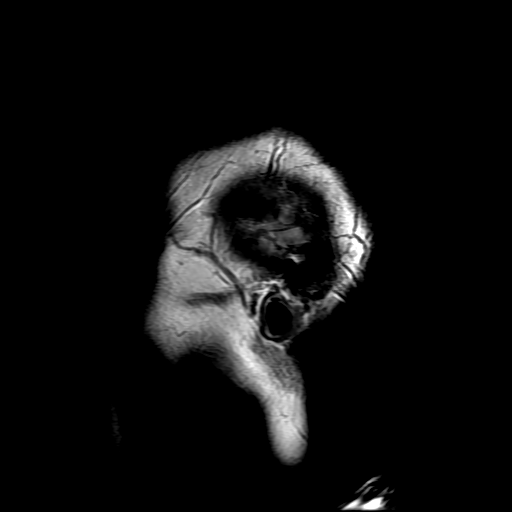

[Series 4: DWI · axial · 5.0mm · 1.09mm/px · z∈[-28,+111]mm · 6 of 58 slices shown (1 of 4)]
[im 1/58]
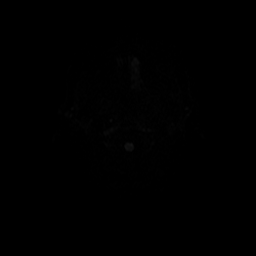
[im 12/58]
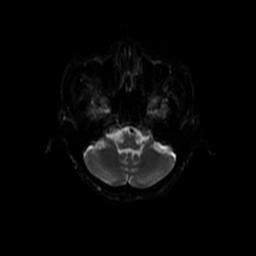
[im 23/58]
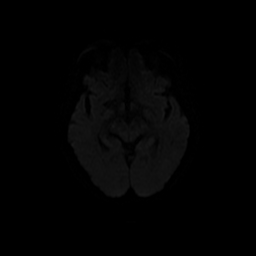
[im 35/58]
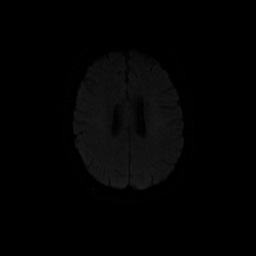
[im 46/58]
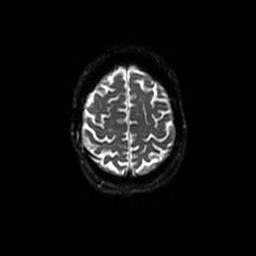
[im 58/58]
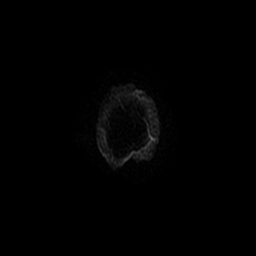

[Series 5: DWI · coronal · 5.0mm · 1.09mm/px · 8 of 64 slices shown (2 of 4)]
[im 1/64]
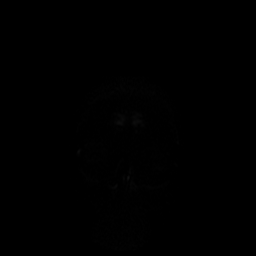
[im 10/64]
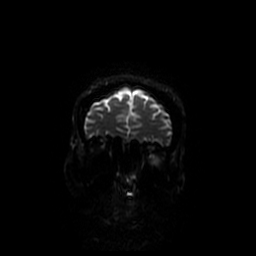
[im 19/64]
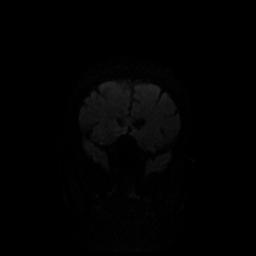
[im 28/64]
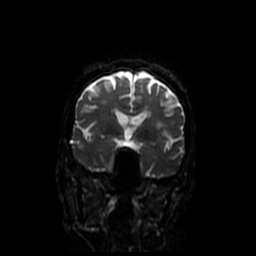
[im 37/64]
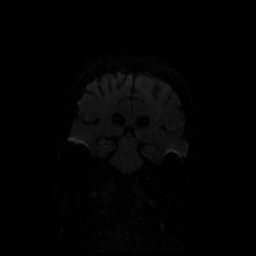
[im 46/64]
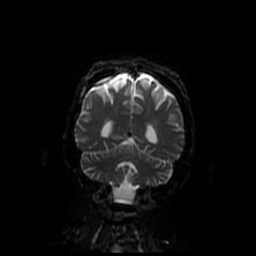
[im 55/64]
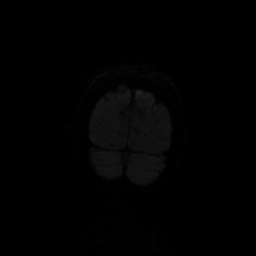
[im 64/64]
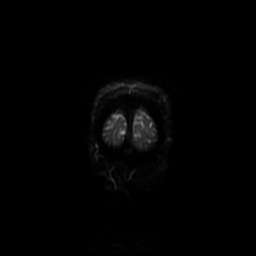

[Series 6: T2 · axial · 5.0mm · 0.43mm/px · z∈[-26,+111]mm · 3 of 24 slices shown (1 of 2)]
[im 1/24]
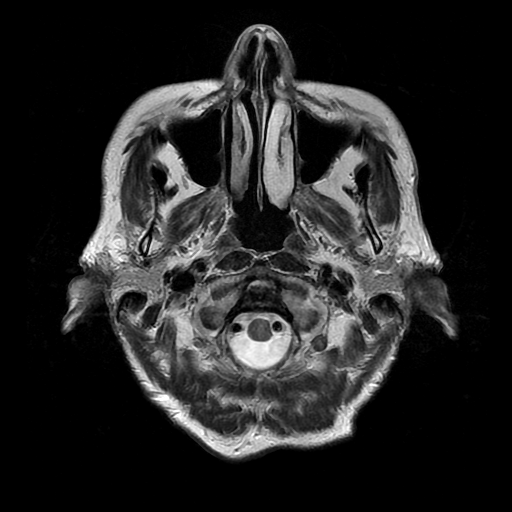
[im 12/24]
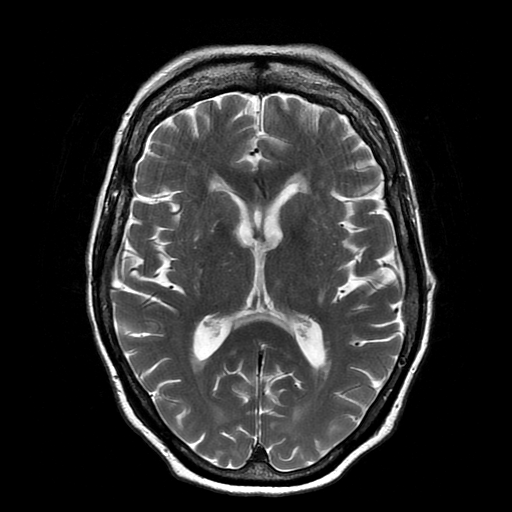
[im 24/24]
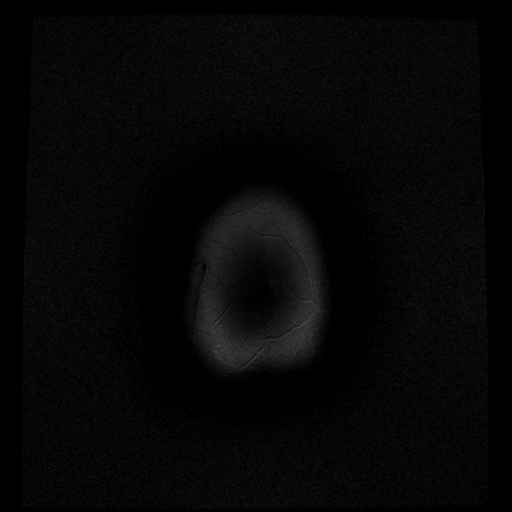

[Series 7: FLAIR · axial · 5.0mm · 0.43mm/px · z∈[-26,+111]mm · 3 of 24 slices shown]
[im 1/24]
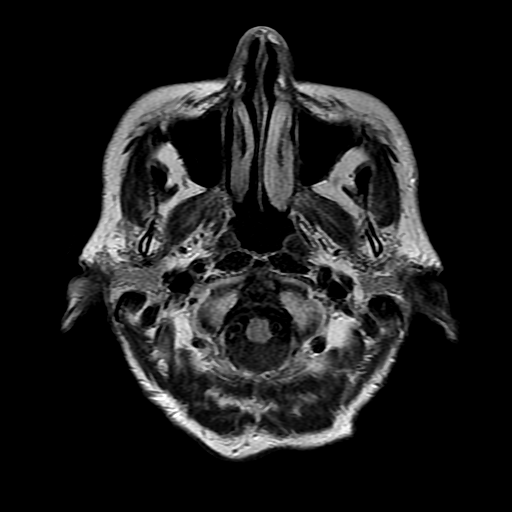
[im 12/24]
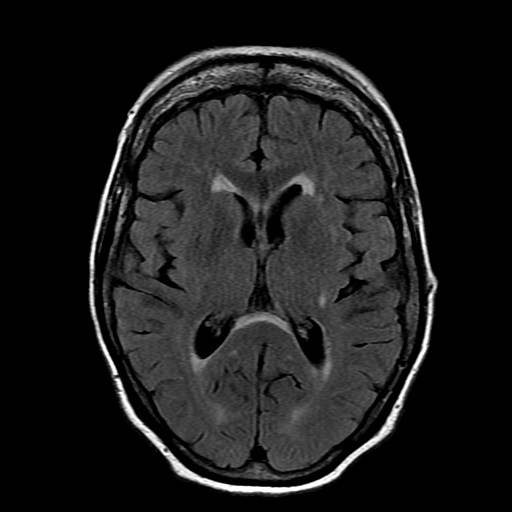
[im 24/24]
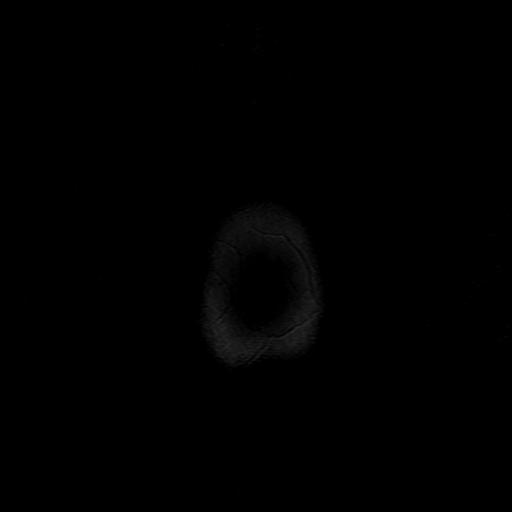

[Series 8: ax mpgr · axial · 5.0mm · 0.43mm/px · 1 of 21 slices shown]
[im 1/21]
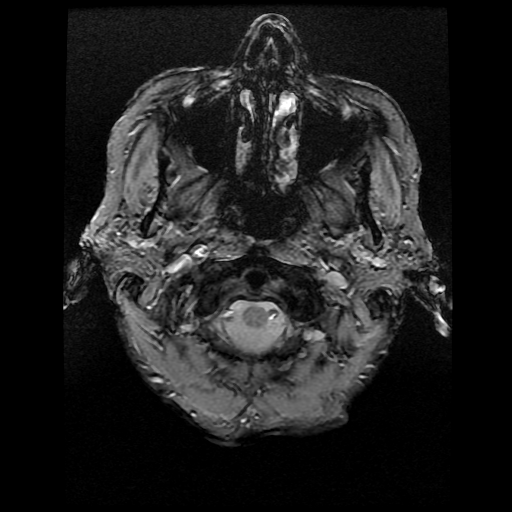

[Series 10: T2 · coronal · 5.0mm · 0.39mm/px · 3 of 25 slices shown (2 of 2)]
[im 1/25]
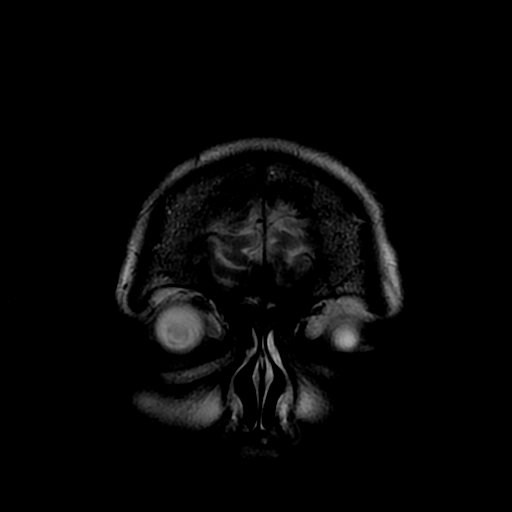
[im 13/25]
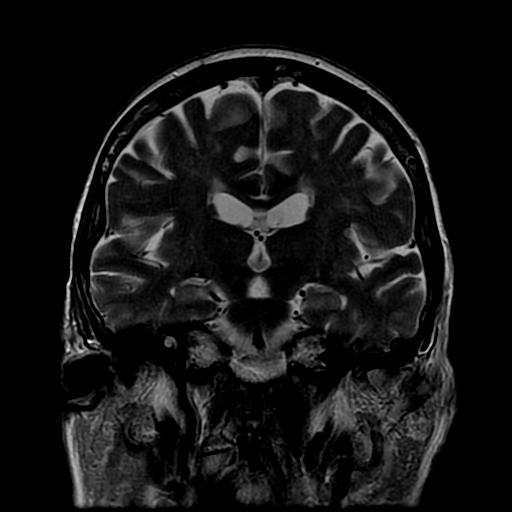
[im 25/25]
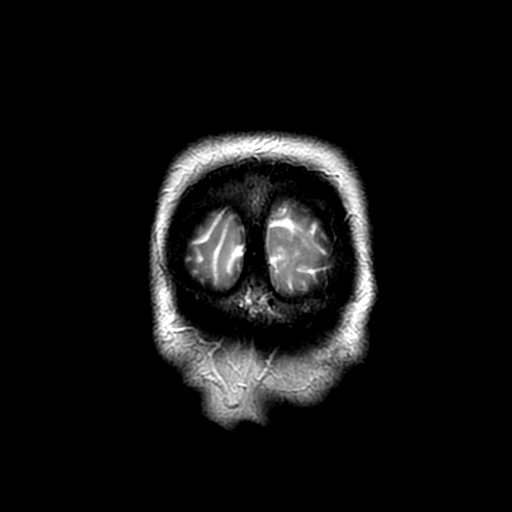

[Series 400: DWI · axial · 5.0mm · 1.09mm/px · z∈[-28,+111]mm · 4 of 29 slices shown (3 of 4)]
[im 1/29]
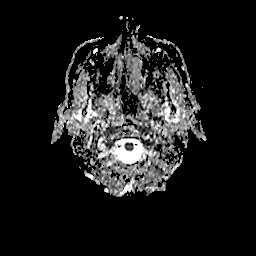
[im 10/29]
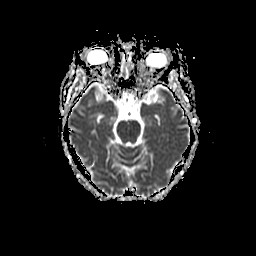
[im 19/29]
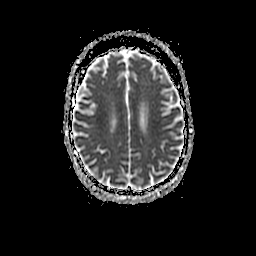
[im 29/29]
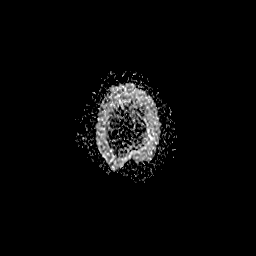

[Series 500: DWI · coronal · 5.0mm · 1.09mm/px · 4 of 32 slices shown (4 of 4)]
[im 1/32]
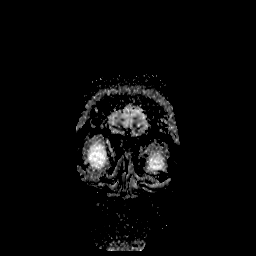
[im 11/32]
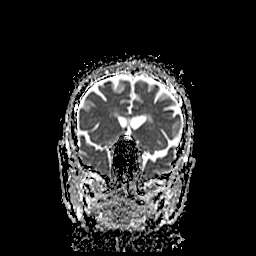
[im 21/32]
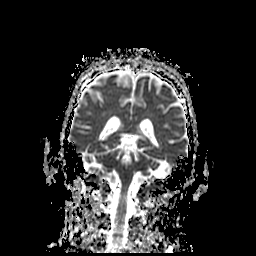
[im 32/32]
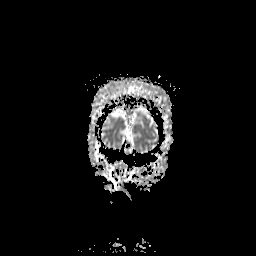

[34 of 48 positions shown; findings below may reference images not displayed]

FINDINGS: Diffusion imaging does not show any acute or subacute infarction.
There are chronic small-vessel ischemic changes throughout the pons.
No focal cerebellar insult. The cerebral hemispheres show moderate
chronic small vessel ischemic changes affecting the deep and
subcortical white matter. No cortical or large vessel territory
infarction. No mass lesion, hemorrhage, hydrocephalus or extra-axial
collection. No pituitary mass. No inflammatory sinus disease. No
skull or skullbase lesion.
IMPRESSION: No acute or reversible finding. Moderate chronic appearing small
vessel ischemic changes affecting the pons in the cerebral
hemispheric white matter. This is slightly progressive since [DATE].

## 2015-05-14 ENCOUNTER — Other Ambulatory Visit: Payer: Self-pay | Admitting: Cardiology

## 2015-05-14 NOTE — Telephone Encounter (Signed)
Rx request sent to pharmacy.  

## 2015-05-22 DIAGNOSIS — D0471 Carcinoma in situ of skin of right lower limb, including hip: Secondary | ICD-10-CM | POA: Diagnosis not present

## 2015-05-22 DIAGNOSIS — L570 Actinic keratosis: Secondary | ICD-10-CM | POA: Diagnosis not present

## 2015-05-22 DIAGNOSIS — D044 Carcinoma in situ of skin of scalp and neck: Secondary | ICD-10-CM | POA: Diagnosis not present

## 2015-05-22 DIAGNOSIS — Z23 Encounter for immunization: Secondary | ICD-10-CM | POA: Diagnosis not present

## 2015-05-22 DIAGNOSIS — L821 Other seborrheic keratosis: Secondary | ICD-10-CM | POA: Diagnosis not present

## 2015-05-22 DIAGNOSIS — D0439 Carcinoma in situ of skin of other parts of face: Secondary | ICD-10-CM | POA: Diagnosis not present

## 2015-06-04 DIAGNOSIS — N182 Chronic kidney disease, stage 2 (mild): Secondary | ICD-10-CM | POA: Diagnosis not present

## 2015-06-04 DIAGNOSIS — N3020 Other chronic cystitis without hematuria: Secondary | ICD-10-CM | POA: Diagnosis not present

## 2015-06-04 DIAGNOSIS — E1142 Type 2 diabetes mellitus with diabetic polyneuropathy: Secondary | ICD-10-CM | POA: Diagnosis not present

## 2015-06-04 DIAGNOSIS — N312 Flaccid neuropathic bladder, not elsewhere classified: Secondary | ICD-10-CM | POA: Diagnosis not present

## 2015-06-04 DIAGNOSIS — Z0000 Encounter for general adult medical examination without abnormal findings: Secondary | ICD-10-CM | POA: Diagnosis not present

## 2015-07-03 ENCOUNTER — Other Ambulatory Visit: Payer: Self-pay | Admitting: Cardiology

## 2015-07-03 NOTE — Telephone Encounter (Signed)
Rx request sent to pharmacy.  

## 2015-07-09 ENCOUNTER — Ambulatory Visit: Payer: Self-pay | Admitting: Nurse Practitioner

## 2015-07-15 ENCOUNTER — Ambulatory Visit: Payer: Self-pay | Admitting: Nurse Practitioner

## 2015-07-16 ENCOUNTER — Encounter: Payer: Self-pay | Admitting: Nurse Practitioner

## 2015-07-21 ENCOUNTER — Other Ambulatory Visit: Payer: Self-pay | Admitting: Nurse Practitioner

## 2015-07-22 DIAGNOSIS — N182 Chronic kidney disease, stage 2 (mild): Secondary | ICD-10-CM | POA: Diagnosis not present

## 2015-07-22 DIAGNOSIS — N2581 Secondary hyperparathyroidism of renal origin: Secondary | ICD-10-CM | POA: Diagnosis not present

## 2015-07-22 DIAGNOSIS — D649 Anemia, unspecified: Secondary | ICD-10-CM | POA: Diagnosis not present

## 2015-07-23 NOTE — Telephone Encounter (Signed)
Spoke to pt and made appt for tomorrow at 0830.  (husband aware).

## 2015-07-24 ENCOUNTER — Ambulatory Visit (INDEPENDENT_AMBULATORY_CARE_PROVIDER_SITE_OTHER): Payer: Medicare Other | Admitting: Nurse Practitioner

## 2015-07-24 ENCOUNTER — Encounter: Payer: Self-pay | Admitting: Nurse Practitioner

## 2015-07-24 VITALS — BP 124/78 | HR 69 | Ht 65.5 in | Wt 167.2 lb

## 2015-07-24 DIAGNOSIS — F0390 Unspecified dementia without behavioral disturbance: Secondary | ICD-10-CM | POA: Diagnosis not present

## 2015-07-24 DIAGNOSIS — G2581 Restless legs syndrome: Secondary | ICD-10-CM | POA: Diagnosis not present

## 2015-07-24 DIAGNOSIS — R269 Unspecified abnormalities of gait and mobility: Secondary | ICD-10-CM | POA: Diagnosis not present

## 2015-07-24 DIAGNOSIS — G3184 Mild cognitive impairment, so stated: Secondary | ICD-10-CM

## 2015-07-24 MED ORDER — PRAMIPEXOLE DIHYDROCHLORIDE 0.25 MG PO TABS: 0.5000 mg | ORAL_TABLET | Freq: Every day | ORAL | Status: DC

## 2015-07-24 MED ORDER — CEREFOLIN NAC 6-2-600 MG PO TABS: 1.0000 | ORAL_TABLET | Freq: Every day | ORAL | Status: DC

## 2015-07-24 NOTE — Progress Notes (Signed)
I agree with the above plan 

## 2015-07-24 NOTE — Progress Notes (Signed)
[facility]  PATIENT: [name] DOB: October 10, 1940   REASON FOR VISIT: Follow-up for mild cognitive impairment, essential hypertension, abnormality of gait, restless leg syndrome HISTORY FROM: Patient and granddaughter    HISTORY OF PRESENT ILLNESS: HISTORY: 38 year patient with mild cognitive impairment versus early dementia.  Update 12/06/2012 (PS): [name] returns for f/u after last vist 06/07/2012. She has stopped cerefolin nac as insurance is not paying for it. Memory loss seems stable but she feels subjectively she was doing better on Cerefolin nac. She recently fell of a tractor in her yard and hurt her foot but is improving. She continues to have poor recent memory but is able to recall later. She decided not to do expedition 3 study upon advise from [name] her cardiologist.   Update 01/10/2014 ; she returns for followup after last visit 6 months ago who. She is scheduled in about the same. She continues to have mild short-term memory difficulties unchanged. She has been taking several daily and finds it quite useful. She saw primary physician Dr. Gerarda Fraction who suggested she stop the Depakote. 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 She used to also have some light sensitivity and possibly migrainous phenomena which also seemed to be controlled on Depakote. She underwent right shoulder surgery 2 weeks ago which went well and she has no complaints. She was hospitalized briefly twice for syncopal episodes and she thought to be vasovagal related to a diarrhea episode and dehydration. Update 07/12/2014 : She returns for follow-up after last visit 6 months ago. He is accompanied by her husband . She states some memory difficulties unchanged. She reads a lot particularly on religion. She has a difficult time remembering the day and the date but has not used good compensatory strategies for this. She remains on Cerefolin which she likes.  She has noted increasing symptoms of restless legs and at times has to take 2 tablets on Mirapex which work well. She remains on Depakote as she feels it helps her headaches and vision difficulties. She states her blood pressure is normally quite high but she has some whitecoat hypertension blood pressure is elevated today at 140/100. UPDATE 07/24/2015 [name], 75 year old female returns for six-month follow-up. 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 She reports that she has good and bad days when she is not feeling her best she stays at home and keeps to herself. She continues to cook and drive and perform all activities of daily living. She reports that she has not gotten lost in familiar places. Her most recent hemoglobin A1c was 5.2 No further syncopal episodes since stopping Depakote . She remains on Cerefolin which she feels is beneficial for her neuropathy and her memory. She is also on Mirapex for restless legs.  She returns for reevaluation    REVIEW OF SYSTEMS: Full 14 system review of systems performed and notable only for those listed, all others are neg:  Constitutional: neg  Cardiovascular: neg Ear/Nose/Throat: neg  Skin: neg Eyes: neg Respiratory: neg Gastroitestinal: neg  Hematology/Lymphatic: neg  Endocrine: neg Musculoskeletal: Joint pain Allergy/Immunology: neg Neurological: Numbness Psychiatric: neg Sleep : Restless legs   ALLERGIES: Allergies  Allergen Reactions  . Lipitor [Atorvastatin] Other (See Comments)    Bone and muscle pain  . Nsaids Swelling  . Reglan [Metoclopramide]     Tardive dyskinesia; paradoxical reaction not relieved by benadryl  . Tylenol [Acetaminophen] Other (See Comments)    "  anything with tylenol in it" patient has Stage II Kidney Failure  . Ultram [Tramadol] Other (See Comments)    Pt has seizure activity with this medication    HOME MEDICATIONS: Outpatient Prescriptions Prior to Visit  Medication Sig  Dispense Refill  . bumetanide (BUMEX) 1 MG tablet TAKE ONE AND ONE-HALF TABLETS DAILY 135 tablet 0  . Calcium Carb-Cholecalciferol (CALCIUM 600 + D PO) Take 1 tablet by mouth daily. Chewable    . cholecalciferol (VITAMIN D) 1000 UNITS tablet Take 1,000 Units by mouth daily.     . clopidogrel (PLAVIX) 75 MG tablet Take 1 tablet (75 mg total) by mouth daily. Please schedule appointment for refills. 90 tablet 0  . Cod Liver Oil 1000 MG CAPS Take 1,000 mg by mouth daily.    Marland Kitchen CRANBERRY-VITAMIN C PO Take 1 capsule by mouth daily.     . diphenhydrAMINE (BENADRYL) 25 mg capsule Take 25 mg by mouth at bedtime as needed for sleep.    Marland Kitchen estrogens, conjugated, (PREMARIN) 0.3 MG tablet Take 0.3 mg by mouth daily.     . Fish Oil-Krill Oil (KRILL OIL PLUS PO) Take 900 mg by mouth daily.    Marland Kitchen gabapentin (NEURONTIN) 300 MG capsule Take 300 mg by mouth at bedtime.    Marland Kitchen losartan-hydrochlorothiazide (HYZAAR) 100-12.5 MG per tablet Take 1 tablet by mouth daily. Taking by mouth daily one half tablet in the morning and one half tablet in the evening    . metFORMIN (GLUCOPHAGE) 500 MG tablet Take 500 mg by mouth 2 (two) times daily with a meal.    . Methylfol-Methylcob-Acetylcyst (CEREFOLIN NAC) 6-2-600 MG TABS Take 1 tablet by mouth daily. 90 each 1  . metoprolol succinate (TOPROL-XL) 50 MG 24 hr tablet Take 25 mg by mouth daily. Take one half tablet by mouth at bedtime    . nitroGLYCERIN (NITROLINGUAL) 0.4 MG/SPRAY spray Place 1 spray under the tongue every 5 (five) minutes x 3 doses as needed for chest pain.     . Omega-3 Fatty Acids (FISH OIL) 1200 MG CAPS Take 1,200 mg by mouth 2 (two) times daily.    Marland Kitchen oxyCODONE (OXY IR/ROXICODONE) 5 MG immediate release tablet Take 1-2 tablets (5-10 mg total) by mouth every 4 (four) hours as needed for moderate pain, severe pain or breakthrough pain. 40 tablet 0  . pantoprazole (PROTONIX) 40 MG tablet Take 40 mg by mouth daily.    . pramipexole (MIRAPEX) 0.25 MG tablet Take 2  tablets (0.5 mg total) by mouth at bedtime. 180 tablet 2  . rosuvastatin (CRESTOR) 20 MG tablet Take 20 mg by mouth at bedtime.    . diazepam (VALIUM) 5 MG tablet Take 5 mg by mouth every 12 (twelve) hours as needed for anxiety (take one half tablet by mouth in the morning and one tablet at bedtime).    Marland Kitchen oxyCODONE (OXY IR/ROXICODONE) 5 MG immediate release tablet Take 1-2 tablets (5-10 mg total) by mouth every 6 (six) hours as needed for moderate pain, severe pain or breakthrough pain. 30 tablet 0   No facility-administered medications prior to visit.    PAST MEDICAL HISTORY: Past Medical History  Diagnosis Date  . CAD S/P percutaneous coronary angioplasty 2007; March 2011    [facility] 3.0 mm 20 mm postdilated 3.6 mm in early mid LAD; status post ISR Cutting Balloon PTCA and March 11 along with PCI of distal mid lesion with a 3.0 mm 12 mm MultiLink vision BMS; the proximal stent causes jailing of  SP1 and SP2 with ostial 70-80% lesions  . Diabetes mellitus type 2 with neurological manifestations ([place])   . Peripheral neuropathy ([place])   . Hypertension, essential, benign   . Dyslipidemia, goal LDL below 70   . Arthritis      osteoarthritis  . Anxiety   . History of hematuria     Followed by Dr. Gaynelle Arabian  . Stroke ([place])   . CHF (congestive heart failure) ([place])   . Dementia   . Chronic kidney disease   . Headache(784.0)   . Cancer (HCC)     Melanoma, Squamous cell Carcinoma  . Melanoma ([place]) 1998    lt foot  . Full dentures   . Wears glasses   . Skin cancer     excisions-several  . Shortness of breath dyspnea     with exertion  . GERD (gastroesophageal reflux disease)     PAST SURGICAL HISTORY: Past Surgical History  Procedure Laterality Date  . Abdominal hysterectomy    . Bladder suspension    . Appendectomy    . Cholecystectomy    . Nm myoview ltd  11/16/2011    5 beats a PVC during recovery.  No skin or infarction.  Reached 5 METs, EKG negative for ischemia   .  Doppler echocardiography  12/31/2009/July 2015    IsLV size & function, Gr 1 DD w/ mild TR.;; b)09/2013: 55-60%. Grade 1 DD, mild aortic sclerosis  . Carotid doppler  06/24/2009    40-59% R ICA stenosis. No significant ICA stenosis noted  . Cardiac catheterization  02/24/2006    85% stenosis in the proximal portion of LAD-arrangements made for PCI on 02/25/2006  . Cardiac catheterization  02/25/2006    80% LAD lesion stented with a 3x66mm Liberte stent resulting in reduction of 80% lesion to 0% residual  . Cardiac catheterization  04/26/2006    Medical management  . Cardiac catheterization  06/24/2009    60-70% re-stenosis in the proximal LAD. A 3.25x15 cutting balloon, 3 inflations - 14atm-38sec, 13atm-39sec, and 12atm-40sec reduced to less than 10%. 60% stenosis of the mid/distal LAD stented with a 3x76mm Multilink stent.  . Cardiac catheterization  07/09/2009    Medical management  . Total knee arthroplasty  2005    left  . Shoulder arthroscopy  10/15  . Shoulder arthroscopy  1995    left  . 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 Mass excision Left 06/10/2014    Procedure: EXCISION LEFT LOWER LEG LESION;  Surgeon: Doreen Salvage, MD;  Location: [facility];  Service: General;  Laterality: Left;  . Joint replacement Left   . Lesion removal Right 03/11/2015    Procedure:  EXCISION OF RIGHT PRE TIBIAL LESION;  Surgeon: Judeth Horn, MD;  Location: [facility];  Service: General;  Laterality: Right;    FAMILY HISTORY: Family History  Problem Relation Age of Onset  . Diabetes Mother   . Hypertension Mother   . Kidney disease Mother   . Hypertension Father   . Emphysema Father   . Heart attack Father   . Heart disease Father   . Arthritis Sister   . Diabetes Sister   . Hypertension Sister   . Hypertension Brother   . Hyperlipidemia Brother   . Diabetes Brother   . Stroke Brother   . Diabetes Sister   . Hypertension Sister   . Cancer Sister      Cervical cancer  . Hypertension Brother   .  Heart attack Daughter   . Hypertension Daughter   . COPD Daughter   . Cancer Daughter     Breast cancer  . Kidney disease Daughter     Kidney mass    SOCIAL HISTORY: Social History   Social History  . Marital Status: Married    Spouse Name: [name]  . Number of Children: 3  . Years of Education: 11   Occupational History  . retired    Social History Main Topics  . Smoking status: Never Smoker   . Smokeless tobacco: Not on file  . Alcohol Use: No  . Drug Use: No  . Sexual Activity: No   Other Topics Concern  . Not on file   Social History Narrative   She is a married mother of 56, grandmother of 2.  Usually very active and out about the house.  But currently over last 6-8 weeks has been incapacitated.  She never smoked and does not drink alcohol.     PHYSICAL EXAM  Filed Vitals:   07/24/15 0818  BP: 124/78  Pulse: 69  Height: 5' 5.5" (1.664 m)  Weight: 167 lb 3.2 oz (75.841 kg)   Body mass index is 27.39 kg/(m^2). General: Frail elderly Caucasian seated, in no evident distress  Head: head normocephalic and atraumatic. Orohparynx benign  Neck: supple with no carotid or supraclavicular bruits  Cardiovascular: regular rate and rhythm, no murmurs  Musculoskeletal: no deformity  Skin: no rash/petichiae  Vascular: Normal pulses all extremities   Neurologic Exam  Mental Status: Awake and fully alert. Oriented to place and time. Recent and remote memory intact. Attention span, concentration and fund of knowledge appropriate. Mood and affect appropriate. MMSE 29/30, AFT 14, CDT 4/4,  Cranial Nerves: Pupils equal, briskly reactive to light. Extraocular movements full without nystagmus. Visual fields full to confrontation. Hearing intact. Facial sensation intact. Face, tongue, palate moves normally and symmetrically.  Motor: Normal bulk and tone. Normal strength in all tested extremity muscles. Mild head tremor at rest.   Sensory: intact to touch , decreased pinprick to midshin decreased vibratory to ankles.  Coordination: Rapid alternating movements normal in all extremities. Finger-to-nose and heel-to-shin performed accurately bilaterally.  Gait and Station: Arises from chair without difficulty. Stance is normal. Gait demonstrates normal stride length and balance . Able to heel, toe and unsteady with tandem walk .No assistive device Reflexes: 1+ and symmetric.  DIAGNOSTIC DATA (LABS, IMAGING, TESTING) - I reviewed patient records, labs, notes, testing and imaging myself where available.  Lab Results  Component Value Date   WBC 7.9 03/06/2015   HGB 11.9* 03/06/2015   HCT 37.2 03/06/2015   MCV 87.1 03/06/2015   PLT 229 03/06/2015      Component Value Date/Time   NA 134* 06/07/2014 1500   K 4.2 06/07/2014 1500   CL 93* 06/07/2014 1500   CO2 30 06/07/2014 1500   GLUCOSE 103* 06/07/2014 1500   BUN 12 06/07/2014 1500   CREATININE 1.06 06/07/2014 1500   CREATININE 0.94 01/09/2013 0839   CALCIUM 9.5 06/07/2014 1500   PROT 6.4 10/07/2013 0526   ALBUMIN 3.0* 10/07/2013 0526   AST 22 10/07/2013 0526   ALT 11 10/07/2013 0526   ALKPHOS 56 10/07/2013 0526   BILITOT <0.2* 10/07/2013 0526   GFRNONAA 50* 06/07/2014 1500   GFRAA 89* 06/07/2014 1500     ASSESSMENT AND PLAN 75 y.o. 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  Likely restless leg syndrome, Peripheral neuropathy (HCC)  Chronic kidney disease; Headache(784.0); gait abnormality here to follow up.   Reviewed previous records Continue Cerefolin at current dose this will help your memory  Continue Mirapex at current dose this is for restless legs Continue gabapentin at current dose, this is for peripheral neuropathy and will also help her restless legs Suggested using a cane for unsteady gait/potential for falls Memory score is stable  Discussed with the patient  ways to enhance memory and to learn compensatory strategies and answered questions. Call for further syncopal episodes versus seizure since her Depakote was discontinued  Follow-up in 6 monthsVst time 25 min Dennie Bible, [facility], [facility], [facility] [address], [place], [place] [address] [phone]

## 2015-07-24 NOTE — Patient Instructions (Addendum)
Continue Cerefolin at current dose this will help your memory will refill Continue Mirapex at current dose this is for restless legs, will refill Continue gabapentin at current dose, this is for peripheral neuropathy and will also help her restless legs ordered by Dr. Bubba Camp Memory score is stable F/U in 6 months

## 2015-07-29 DIAGNOSIS — I10 Essential (primary) hypertension: Secondary | ICD-10-CM | POA: Diagnosis not present

## 2015-07-29 DIAGNOSIS — N182 Chronic kidney disease, stage 2 (mild): Secondary | ICD-10-CM | POA: Diagnosis not present

## 2015-08-10 ENCOUNTER — Other Ambulatory Visit: Payer: Self-pay | Admitting: Cardiology

## 2015-08-11 NOTE — Telephone Encounter (Signed)
Rx request sent to pharmacy.  

## 2015-08-26 ENCOUNTER — Ambulatory Visit: Payer: Self-pay | Admitting: General Surgery

## 2015-08-26 DIAGNOSIS — C44629 Squamous cell carcinoma of skin of left upper limb, including shoulder: Secondary | ICD-10-CM | POA: Diagnosis not present

## 2015-08-26 DIAGNOSIS — C44621 Squamous cell carcinoma of skin of unspecified upper limb, including shoulder: Secondary | ICD-10-CM | POA: Diagnosis not present

## 2015-09-01 ENCOUNTER — Encounter (HOSPITAL_BASED_OUTPATIENT_CLINIC_OR_DEPARTMENT_OTHER): Payer: Self-pay | Admitting: *Deleted

## 2015-09-02 ENCOUNTER — Other Ambulatory Visit: Payer: Self-pay | Admitting: General Surgery

## 2015-09-02 ENCOUNTER — Other Ambulatory Visit: Payer: Self-pay

## 2015-09-02 ENCOUNTER — Encounter (HOSPITAL_BASED_OUTPATIENT_CLINIC_OR_DEPARTMENT_OTHER)
Admission: RE | Admit: 2015-09-02 | Discharge: 2015-09-02 | Disposition: A | Discharge status: Home / Self Care | Payer: Medicare Other | Source: Ambulatory Visit

## 2015-09-02 ENCOUNTER — Ambulatory Visit
Admission: RE | Admit: 2015-09-02 | Discharge: 2015-09-02 | Disposition: A | Discharge status: Home / Self Care | Payer: Medicare Other | Source: Ambulatory Visit | Attending: General Surgery | Admitting: General Surgery

## 2015-09-02 DIAGNOSIS — I2510 Atherosclerotic heart disease of native coronary artery without angina pectoris: Secondary | ICD-10-CM | POA: Diagnosis not present

## 2015-09-02 DIAGNOSIS — Z79890 Hormone replacement therapy (postmenopausal): Secondary | ICD-10-CM | POA: Diagnosis not present

## 2015-09-02 DIAGNOSIS — Z79899 Other long term (current) drug therapy: Secondary | ICD-10-CM | POA: Diagnosis not present

## 2015-09-02 DIAGNOSIS — Z8673 Personal history of transient ischemic attack (TIA), and cerebral infarction without residual deficits: Secondary | ICD-10-CM | POA: Diagnosis not present

## 2015-09-02 DIAGNOSIS — Z7902 Long term (current) use of antithrombotics/antiplatelets: Secondary | ICD-10-CM | POA: Diagnosis not present

## 2015-09-02 DIAGNOSIS — Z7984 Long term (current) use of oral hypoglycemic drugs: Secondary | ICD-10-CM | POA: Diagnosis not present

## 2015-09-02 DIAGNOSIS — E119 Type 2 diabetes mellitus without complications: Secondary | ICD-10-CM | POA: Diagnosis not present

## 2015-09-02 DIAGNOSIS — Z01811 Encounter for preprocedural respiratory examination: Secondary | ICD-10-CM | POA: Diagnosis not present

## 2015-09-02 DIAGNOSIS — K219 Gastro-esophageal reflux disease without esophagitis: Secondary | ICD-10-CM | POA: Diagnosis not present

## 2015-09-02 DIAGNOSIS — R2232 Localized swelling, mass and lump, left upper limb: Secondary | ICD-10-CM

## 2015-09-02 DIAGNOSIS — E7800 Pure hypercholesterolemia, unspecified: Secondary | ICD-10-CM | POA: Diagnosis not present

## 2015-09-02 DIAGNOSIS — C44629 Squamous cell carcinoma of skin of left upper limb, including shoulder: Secondary | ICD-10-CM | POA: Diagnosis not present

## 2015-09-02 DIAGNOSIS — I10 Essential (primary) hypertension: Secondary | ICD-10-CM | POA: Diagnosis not present

## 2015-09-02 DIAGNOSIS — Z955 Presence of coronary angioplasty implant and graft: Secondary | ICD-10-CM | POA: Diagnosis not present

## 2015-09-02 LAB — BASIC METABOLIC PANEL
Anion gap: 10 (ref 5–15)
BUN: 14 mg/dL (ref 6–20)
CO2: 29 mmol/L (ref 22–32)
Calcium: 9.6 mg/dL (ref 8.9–10.3)
Chloride: 92 mmol/L — ABNORMAL LOW (ref 101–111)
Creatinine, Ser: 1.17 mg/dL — ABNORMAL HIGH (ref 0.44–1.00)
GFR calc Af Amer: 51 mL/min — ABNORMAL LOW (ref >60)
GFR calc non Af Amer: 44 mL/min — ABNORMAL LOW (ref >60)
Glucose, Bld: 105 mg/dL — ABNORMAL HIGH (ref 65–99)
Potassium: 4.1 mmol/L (ref 3.5–5.1)
Sodium: 131 mmol/L — ABNORMAL LOW (ref 135–145)

## 2015-09-02 LAB — CBC WITH DIFFERENTIAL/PLATELET
Basophils Absolute: 0 10*3/uL (ref 0.0–0.1)
Basophils Relative: 0 %
Eosinophils Absolute: 0.1 10*3/uL (ref 0.0–0.7)
Eosinophils Relative: 1 %
HCT: 34.2 % — ABNORMAL LOW (ref 36.0–46.0)
Hemoglobin: 11.1 g/dL — ABNORMAL LOW (ref 12.0–15.0)
Lymphocytes Relative: 22 %
Lymphs Abs: 1.7 10*3/uL (ref 0.7–4.0)
MCH: 26.6 pg (ref 26.0–34.0)
MCHC: 32.5 g/dL (ref 30.0–36.0)
MCV: 81.8 fL (ref 78.0–100.0)
Monocytes Absolute: 0.5 10*3/uL (ref 0.1–1.0)
Monocytes Relative: 7 %
Neutro Abs: 5.3 10*3/uL (ref 1.7–7.7)
Neutrophils Relative %: 70 %
Platelets: 238 10*3/uL (ref 150–400)
RBC: 4.18 MIL/uL (ref 3.87–5.11)
RDW: 14.7 % (ref 11.5–15.5)
WBC: 7.5 10*3/uL (ref 4.0–10.5)

## 2015-09-04 NOTE — H&P (Signed)
[name] 08/26/2015 11:31 AM Location: [facility] Patient #: [id number] DOB: 1940-12-06 Married / Language: English / Race: White Female   History of Present Illness [name] CMA; 08/26/2015 11:32 AM) Patient words: cyst arm.  The patient is a 75 year old female.   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 Other disease, cancer, significant illness Vascular Disease  Past Surgical History [name], [name]; 08/26/2015 11:32 AM) Appendectomy Breast Biopsy Left. Cataract Surgery Bilateral. Cesarean Section - 1 Colon Polyp Removal - Colonoscopy Gallbladder Surgery - Laparoscopic Hysterectomy (not due to cancer) - Complete Knee Surgery Left. Shoulder Surgery Bilateral.  Diagnostic Studies History [name], [name]; 08/26/2015 11:32 AM) Colonoscopy 1-5 years ago Mammogram 1-3 years ago Pap Smear >5 years ago  Allergies [name], CMA; 08/26/2015 11:32 AM) Reglan *GASTROINTESTINAL AGENTS - MISC.* Ultram *ANALGESICS - OPIOID* Lipitor *ANTIHYPERLIPIDEMICS* NSAIDs  Medication History [name], CMA; 08/26/2015 11:32 AM) Resveratrol (250MG  Capsule, Oral) Active. Fluorouracil (0.5% Cream, External) Active. Protonix (40MG  Tablet DR, Oral) Active. Depakote (500MG  Tablet DR, Oral) Active. Biotin (10MG  Tablet, Oral) Active. C-500 (500MG  Tablet, Oral) Active. Bumetanide (1MG  Tablet, Oral) Active. Cerefolin NAC (6-2-600MG  Tablet, Oral)  Active. Cod Liver Oil (Oral) Active. Cranberry Plus Vitamin C (4200-20-3MG -MG-UNIT Capsule, Oral) Active. Crestor (20MG  Tablet, Oral) Active. D3-1000 (1000UNIT Capsule, Oral) Active. Diazepam (5MG  Tablet, Oral) Active. (1/2 tab in am and one at bedtime) Gabapentin (300MG  Tablet, Oral) Active. Isosorbide Mononitrate ER (60MG  Tablet ER 24HR, Oral) Active. Krill Oil Omega-3 (300MG  Capsule, Oral) Active. Losartan Potassium (50MG  Tablet, bid Oral) Active. Plavix (75MG  Tablet, Oral) Active. Premarin (0.3MG  Tablet, Oral) Active. MetFORMIN HCl (500MG  Tablet, Oral) Active. Metoprolol Succinate ER (25MG  Tablet ER 24HR, Oral) Active. Mirapex (0.25MG  Tablet, Oral) Active. Medications Reconciled  Social History [name], [name]; 08/26/2015 11:32 AM) Caffeine use Carbonated beverages, Coffee, Tea. No alcohol use No drug use Tobacco use Never smoker.  Family History [name], [name]; 08/26/2015 11:32 AM) Arthritis Brother, Father, Sister. Breast Cancer Daughter, Sister. Cerebrovascular Accident Brother. Cervical Cancer Sister. Depression Brother, Sister. Diabetes Mellitus Brother, Mother, Sister. Heart Disease Brother, Father, Mother, Sister. Heart disease in female family member before age 78 Heart disease in female family member before age 30 Hypertension Brother, Father, Mother, Sister. Kidney Disease Mother. Migraine Headache Sister. Respiratory Condition Daughter, Father, Sister. Thyroid problems Daughter, Mother, Sister.  Pregnancy / Birth History [name], [name]; 08/26/2015 11:32 AM) Age at menarche 89 years. Contraceptive History Oral contraceptives. Maternal age 58-20 Para 3  Vitals ([place]. [name] CMA; 08/26/2015 11:31 AM) 08/26/2015 11:31 AM Weight: 169.5 lb Height: 66in Body Surface Area: 1.86 m Body Mass Index: 27.36 kg/m  Temp.: 97.22F(Oral)  Pulse: 77 (Regular)  BP: 124/82 (Sitting, Left Arm,  Standard)       Physical Exam (Kristen Lutterman O. Janiel Derhammer MD; 08/26/2015 12:05 PM) Integumentary Problem #1 Description - Appearance - nodule(1.0 cm nodule LUE, forearm, quickly occurinjg in the last week. Very likely squamous cell cancer). Note: Nodule seen on picture attched to chart.  Lymphatic Note: No axillary adenopathy bilaterally.     Assessment & Plan [name] O. 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 Needs immediate excision.  This patient is wll known to me, having removed multiple skin lesions and cancers.  She  Has and obvious 1-1.2 cm nodular lesion She may also have a new growth starting on her right arm near the wrist. We will not biopsy this today.  [name]. Kristen Bailiff, MD, [place] [phone] [name]

## 2015-09-05 ENCOUNTER — Encounter (HOSPITAL_BASED_OUTPATIENT_CLINIC_OR_DEPARTMENT_OTHER)
Admission: RE | Disposition: A | Discharge status: Home / Self Care | Payer: Self-pay | Source: Ambulatory Visit | Attending: General Surgery

## 2015-09-05 ENCOUNTER — Ambulatory Visit (HOSPITAL_BASED_OUTPATIENT_CLINIC_OR_DEPARTMENT_OTHER)
Admission: RE | Admit: 2015-09-05 | Discharge: 2015-09-05 | Disposition: A | Discharge status: Home / Self Care | Payer: Medicare Other | Source: Ambulatory Visit | Attending: General Surgery | Admitting: General Surgery

## 2015-09-05 ENCOUNTER — Ambulatory Visit (HOSPITAL_COMMUNITY): Payer: Medicare Other

## 2015-09-05 ENCOUNTER — Encounter (HOSPITAL_BASED_OUTPATIENT_CLINIC_OR_DEPARTMENT_OTHER): Payer: Self-pay | Admitting: *Deleted

## 2015-09-05 ENCOUNTER — Ambulatory Visit (HOSPITAL_BASED_OUTPATIENT_CLINIC_OR_DEPARTMENT_OTHER): Payer: Medicare Other | Admitting: Anesthesiology

## 2015-09-05 DIAGNOSIS — I2510 Atherosclerotic heart disease of native coronary artery without angina pectoris: Secondary | ICD-10-CM | POA: Diagnosis not present

## 2015-09-05 DIAGNOSIS — Z79899 Other long term (current) drug therapy: Secondary | ICD-10-CM | POA: Insufficient documentation

## 2015-09-05 DIAGNOSIS — C44629 Squamous cell carcinoma of skin of left upper limb, including shoulder: Secondary | ICD-10-CM | POA: Diagnosis not present

## 2015-09-05 DIAGNOSIS — Z7984 Long term (current) use of oral hypoglycemic drugs: Secondary | ICD-10-CM | POA: Insufficient documentation

## 2015-09-05 DIAGNOSIS — E7800 Pure hypercholesterolemia, unspecified: Secondary | ICD-10-CM | POA: Diagnosis not present

## 2015-09-05 DIAGNOSIS — Z79890 Hormone replacement therapy (postmenopausal): Secondary | ICD-10-CM | POA: Insufficient documentation

## 2015-09-05 DIAGNOSIS — Z01818 Encounter for other preprocedural examination: Secondary | ICD-10-CM

## 2015-09-05 DIAGNOSIS — N189 Chronic kidney disease, unspecified: Secondary | ICD-10-CM | POA: Diagnosis not present

## 2015-09-05 DIAGNOSIS — Z955 Presence of coronary angioplasty implant and graft: Secondary | ICD-10-CM | POA: Insufficient documentation

## 2015-09-05 DIAGNOSIS — I10 Essential (primary) hypertension: Secondary | ICD-10-CM | POA: Insufficient documentation

## 2015-09-05 DIAGNOSIS — Z8673 Personal history of transient ischemic attack (TIA), and cerebral infarction without residual deficits: Secondary | ICD-10-CM | POA: Insufficient documentation

## 2015-09-05 DIAGNOSIS — E119 Type 2 diabetes mellitus without complications: Secondary | ICD-10-CM | POA: Diagnosis not present

## 2015-09-05 DIAGNOSIS — I129 Hypertensive chronic kidney disease with stage 1 through stage 4 chronic kidney disease, or unspecified chronic kidney disease: Secondary | ICD-10-CM | POA: Diagnosis not present

## 2015-09-05 DIAGNOSIS — Z7902 Long term (current) use of antithrombotics/antiplatelets: Secondary | ICD-10-CM | POA: Insufficient documentation

## 2015-09-05 DIAGNOSIS — K219 Gastro-esophageal reflux disease without esophagitis: Secondary | ICD-10-CM | POA: Insufficient documentation

## 2015-09-05 HISTORY — PX: MASS EXCISION: SHX2000

## 2015-09-05 LAB — GLUCOSE, CAPILLARY
Glucose-Capillary: 102 mg/dL — ABNORMAL HIGH (ref 65–99)
Glucose-Capillary: 93 mg/dL (ref 65–99)

## 2015-09-05 SURGERY — EXCISION MASS
Anesthesia: Monitor Anesthesia Care | Laterality: Left

## 2015-09-05 MED ORDER — CEFAZOLIN SODIUM-DEXTROSE 2-4 GM/100ML-% IV SOLN
INTRAVENOUS | Status: AC
  Filled 2015-09-05: qty 100

## 2015-09-05 MED ORDER — MEPERIDINE HCL 25 MG/ML IJ SOLN: 6.2500 mg | INTRAMUSCULAR | Status: DC | PRN

## 2015-09-05 MED ORDER — FENTANYL CITRATE (PF) 100 MCG/2ML IJ SOLN
50.0000 ug | INTRAMUSCULAR | Status: DC | PRN
  Administered 2015-09-05: 100 ug via INTRAVENOUS

## 2015-09-05 MED ORDER — HYDROMORPHONE HCL 1 MG/ML IJ SOLN
0.2500 mg | INTRAMUSCULAR | Status: DC | PRN
Start: 2015-09-05 — End: 2015-09-05

## 2015-09-05 MED ORDER — MIDAZOLAM HCL 2 MG/2ML IJ SOLN
1.0000 mg | INTRAMUSCULAR | Status: DC | PRN
  Administered 2015-09-05: 2 mg via INTRAVENOUS

## 2015-09-05 MED ORDER — BUPIVACAINE HCL (PF) 0.25 % IJ SOLN
INTRAMUSCULAR | Status: AC
  Filled 2015-09-05: qty 30

## 2015-09-05 MED ORDER — CEFAZOLIN SODIUM-DEXTROSE 2-4 GM/100ML-% IV SOLN
2.0000 g | INTRAVENOUS | Status: AC
  Administered 2015-09-05: 2 g via INTRAVENOUS

## 2015-09-05 MED ORDER — LIDOCAINE-EPINEPHRINE (PF) 1 %-1:200000 IJ SOLN
INTRAMUSCULAR | Status: DC | PRN
  Administered 2015-09-05: 10 mL

## 2015-09-05 MED ORDER — LIDOCAINE-EPINEPHRINE (PF) 1 %-1:200000 IJ SOLN
INTRAMUSCULAR | Status: AC
  Filled 2015-09-05: qty 30

## 2015-09-05 MED ORDER — LIDOCAINE 2% (20 MG/ML) 5 ML SYRINGE
INTRAMUSCULAR | Status: AC
  Filled 2015-09-05: qty 5

## 2015-09-05 MED ORDER — GLYCOPYRROLATE 0.2 MG/ML IJ SOLN: 0.2000 mg | Freq: Once | INTRAMUSCULAR | Status: DC | PRN

## 2015-09-05 MED ORDER — ONDANSETRON HCL 4 MG/2ML IJ SOLN: 4.0000 mg | Freq: Once | INTRAMUSCULAR | Status: DC | PRN

## 2015-09-05 MED ORDER — SODIUM BICARBONATE 4 % IV SOLN
INTRAVENOUS | Status: AC
  Filled 2015-09-05: qty 5

## 2015-09-05 MED ORDER — LIDOCAINE HCL (PF) 1 % IJ SOLN
INTRAMUSCULAR | Status: AC
  Filled 2015-09-05: qty 30

## 2015-09-05 MED ORDER — POVIDONE-IODINE 10 % EX OINT
TOPICAL_OINTMENT | CUTANEOUS | Status: DC | PRN
  Administered 2015-09-05: 1 via TOPICAL

## 2015-09-05 MED ORDER — ONDANSETRON HCL 4 MG/2ML IJ SOLN
INTRAMUSCULAR | Status: DC | PRN
  Administered 2015-09-05: 4 mg via INTRAVENOUS

## 2015-09-05 MED ORDER — CHLORHEXIDINE GLUCONATE 4 % EX LIQD: 1.0000 "application " | Freq: Once | CUTANEOUS | Status: DC

## 2015-09-05 MED ORDER — ONDANSETRON HCL 4 MG/2ML IJ SOLN
INTRAMUSCULAR | Status: AC
  Filled 2015-09-05: qty 2

## 2015-09-05 MED ORDER — SUCCINYLCHOLINE CHLORIDE 200 MG/10ML IV SOSY
PREFILLED_SYRINGE | INTRAVENOUS | Status: AC
  Filled 2015-09-05: qty 10

## 2015-09-05 MED ORDER — MIDAZOLAM HCL 2 MG/2ML IJ SOLN
INTRAMUSCULAR | Status: AC
  Filled 2015-09-05: qty 2

## 2015-09-05 MED ORDER — FENTANYL CITRATE (PF) 100 MCG/2ML IJ SOLN
INTRAMUSCULAR | Status: AC
  Filled 2015-09-05: qty 2

## 2015-09-05 MED ORDER — ATROPINE SULFATE 1 MG/ML IJ SOLN
INTRAMUSCULAR | Status: AC
  Filled 2015-09-05: qty 1

## 2015-09-05 MED ORDER — SCOPOLAMINE 1 MG/3DAYS TD PT72: 1.0000 | MEDICATED_PATCH | Freq: Once | TRANSDERMAL | Status: DC | PRN

## 2015-09-05 MED ORDER — OXYCODONE HCL 5 MG PO TABS: 5.0000 mg | ORAL_TABLET | Freq: Four times a day (QID) | ORAL | Status: DC | PRN

## 2015-09-05 MED ORDER — PROPOFOL 500 MG/50ML IV EMUL
INTRAVENOUS | Status: DC | PRN
  Administered 2015-09-05: 50 ug/kg/min via INTRAVENOUS

## 2015-09-05 MED ORDER — POVIDONE-IODINE 10 % EX OINT
TOPICAL_OINTMENT | CUTANEOUS | Status: AC
  Filled 2015-09-05: qty 28.35

## 2015-09-05 MED ORDER — LACTATED RINGERS IV SOLN
INTRAVENOUS | Status: DC
  Administered 2015-09-05 (×2): via INTRAVENOUS

## 2015-09-05 MED ORDER — BUPIVACAINE-EPINEPHRINE (PF) 0.25% -1:200000 IJ SOLN
INTRAMUSCULAR | Status: AC
  Filled 2015-09-05: qty 30

## 2015-09-05 MED ORDER — PHENYLEPHRINE 40 MCG/ML (10ML) SYRINGE FOR IV PUSH (FOR BLOOD PRESSURE SUPPORT)
PREFILLED_SYRINGE | INTRAVENOUS | Status: AC
  Filled 2015-09-05: qty 10

## 2015-09-05 MED ORDER — EPHEDRINE 5 MG/ML INJ
INTRAVENOUS | Status: AC
  Filled 2015-09-05: qty 10

## 2015-09-05 SURGICAL SUPPLY — 53 items
BANDAGE ACE 4X5 VEL STRL LF (GAUZE/BANDAGES/DRESSINGS) ×1 IMPLANT
BLADE CLIPPER SURG (BLADE) IMPLANT
BLADE SURG 15 STRL LF DISP TIS (BLADE) ×1 IMPLANT
BLADE SURG 15 STRL SS (BLADE) ×2
BNDG ADH 5X4 AIR PERM ELC (GAUZE/BANDAGES/DRESSINGS)
BNDG COHESIVE 4X5 WHT NS (GAUZE/BANDAGES/DRESSINGS) IMPLANT
BNDG GAUZE ELAST 4 BULKY (GAUZE/BANDAGES/DRESSINGS) ×1 IMPLANT
CANISTER SUCT 1200ML W/VALVE (MISCELLANEOUS) IMPLANT
CHLORAPREP W/TINT 26ML (MISCELLANEOUS) ×2 IMPLANT
CLEANER CAUTERY TIP 5X5 PAD (MISCELLANEOUS) ×1 IMPLANT
COVER BACK TABLE 60X90IN (DRAPES) ×2 IMPLANT
COVER MAYO STAND STRL (DRAPES) ×2 IMPLANT
DECANTER SPIKE VIAL GLASS SM (MISCELLANEOUS) IMPLANT
DRAPE LAPAROTOMY T 102X78X121 (DRAPES) IMPLANT
DRAPE UTILITY XL STRL (DRAPES) ×3 IMPLANT
DRSG TEGADERM 4X4.75 (GAUZE/BANDAGES/DRESSINGS) IMPLANT
ELECT REM PT RETURN 9FT ADLT (ELECTROSURGICAL) ×2
ELECTRODE REM PT RTRN 9FT ADLT (ELECTROSURGICAL) ×1 IMPLANT
GLOVE BIOGEL PI IND STRL 7.0 (GLOVE) IMPLANT
GLOVE BIOGEL PI IND STRL 8 (GLOVE) ×1 IMPLANT
GLOVE BIOGEL PI INDICATOR 7.0 (GLOVE) ×2
GLOVE BIOGEL PI INDICATOR 8 (GLOVE) ×1
GLOVE ECLIPSE 6.5 STRL STRAW (GLOVE) ×1 IMPLANT
GLOVE ECLIPSE 7.5 STRL STRAW (GLOVE) ×2 IMPLANT
GOWN STRL REUS W/ TWL LRG LVL3 (GOWN DISPOSABLE) ×1 IMPLANT
GOWN STRL REUS W/TWL LRG LVL3 (GOWN DISPOSABLE) ×2
LIQUID BAND (GAUZE/BANDAGES/DRESSINGS) IMPLANT
NDL HYPO 25X1 1.5 SAFETY (NEEDLE) ×1 IMPLANT
NEEDLE HYPO 25X1 1.5 SAFETY (NEEDLE) ×2 IMPLANT
NS IRRIG 1000ML POUR BTL (IV SOLUTION) ×1 IMPLANT
PACK BASIN DAY SURGERY FS (CUSTOM PROCEDURE TRAY) ×2 IMPLANT
PAD CLEANER CAUTERY TIP 5X5 (MISCELLANEOUS) ×1
PENCIL BUTTON HOLSTER BLD 10FT (ELECTRODE) ×2 IMPLANT
SLEEVE SCD COMPRESS KNEE MED (MISCELLANEOUS) ×2 IMPLANT
SPONGE GAUZE 4X4 12PLY STER LF (GAUZE/BANDAGES/DRESSINGS) ×1 IMPLANT
STRIP CLOSURE SKIN 1/4X4 (GAUZE/BANDAGES/DRESSINGS) IMPLANT
SUCTION FRAZIER HANDLE 10FR (MISCELLANEOUS)
SUCTION TUBE FRAZIER 10FR DISP (MISCELLANEOUS) IMPLANT
SUT ETHILON 3 0 PS 1 (SUTURE) IMPLANT
SUT ETHILON 4 0 PS 2 18 (SUTURE) ×2 IMPLANT
SUT MON AB 4-0 PC3 18 (SUTURE) IMPLANT
SUT PROLENE 4 0 PS 2 18 (SUTURE) ×1 IMPLANT
SUT VIC AB 2-0 SH 27 (SUTURE)
SUT VIC AB 2-0 SH 27XBRD (SUTURE) IMPLANT
SUT VIC AB 4-0 SH 27 (SUTURE)
SUT VIC AB 4-0 SH 27XANBCTRL (SUTURE) IMPLANT
SYR BULB 3OZ (MISCELLANEOUS) ×1 IMPLANT
SYR CONTROL 10ML LL (SYRINGE) ×2 IMPLANT
TOWEL OR 17X24 6PK STRL BLUE (TOWEL DISPOSABLE) ×2 IMPLANT
TOWEL OR NON WOVEN STRL DISP B (DISPOSABLE) IMPLANT
TUBE CONNECTING 20X1/4 (TUBING) IMPLANT
UNDERPAD 30X30 (UNDERPADS AND DIAPERS) ×1 IMPLANT
YANKAUER SUCT BULB TIP NO VENT (SUCTIONS) IMPLANT

## 2015-09-05 NOTE — Discharge Instructions (Addendum)
Outpatient Surgery Guidelines, Adult Outpatient procedures are those for which the person having the procedure is allowed to go home the same day as the procedure. Various procedures are done on an outpatient basis. You should follow some general guidelines if you will be having an outpatient procedure. LET [facility] CARE PROVIDER KNOW ABOUT:  Any allergies you have.  All medicines you are taking, including vitamins, herbs, eye drops, creams, and over-the-counter medicines.  Previous problems you or members of your family have had with the use of anesthetics.  Any blood disorders you have.  Previous surgeries you have had.  Medical conditions you have. RISKS AND COMPLICATIONS Your health care provider will discuss possible risks and complications with you before surgery. Common risks and complications include:   Problems due to the use of anesthetics.  Blood loss and replacement (does not apply to minor surgical procedures).  Temporary increase in pain due to surgery.  Uncorrected pain or problems that the surgery was meant to correct.  Infection.  New damage. BEFORE THE PROCEDURE  Ask your health care provider about changing or stopping your regular medicines. You may need to stop taking certain medicines in the days or weeks before the procedure.  Stop smoking at least 2 weeks before surgery. This lowers your risk for complications during and after surgery. Ask your health care provider for help with this if needed.  Eat your usual meals and a light supper the day before surgery. Continue fluid intake. Do not drink alcohol.  Do not eat or drink after midnight the night before your surgery. Take your usual medicine the morning of surgery with a sip of water unless instructed otherwise. Check with your health care provider if you are unsure. This is particularly important if you take diabetes medicine.  Arrange for someone to take you home and to stay with you for 24 hours  after the procedure. Medicine given for your procedure may affect your ability to drive or to care for yourself.  Call your health care provider's office if you develop an illness or problem that may prevent you from safely having your procedure. AFTER THE PROCEDURE After surgery, you will be taken to a recovery area, where your progress will be monitored. If there are no complications, you will be allowed to go home when you are awake, stable, and taking fluids well. You may have numbness around the surgical site. Healing will take some time. You will have tenderness at the surgical site and may have some swelling and bruising. You may also have some nausea. HOME CARE INSTRUCTIONS  Do not drive for 24 hours, or as directed by your health care provider. Do not drive while taking prescription pain medicines.  Do not drink alcohol for 24 hours.  Do not make important decisions or sign legal documents for 24 hours.  You may resume a normal diet and activities as directed.  Do not lift anything heavier than 10 pounds (4.5 kg) or play contact sports until your health care provider says it is okay.  Change your bandages (dressings) as directed.  Only take over-the-counter or prescription medicines as directed by your health care provider.  Follow up with your health care provider as directed. SEEK MEDICAL CARE IF:  You have increased bleeding (more than a small spot) from the surgical site.  You have redness, swelling, or increasing pain in the wound.  You see pus coming from the wound.  You have a fever.  You notice a bad smell  coming from the wound or dressing.  You feel lightheaded or faint.  You develop a rash.  You have trouble breathing.  You develop allergies. MAKE SURE YOU:  Understand these instructions.  Will watch your condition.  Will get help right away if you are not doing well or get worse.   This information is not intended to replace advice given to you by  your health care provider. Make sure you discuss any questions you have with your health care provider.  Excision of Skin Lesions, Care After Refer to this sheet in the next few weeks. These instructions provide you with information about caring for yourself after your procedure. Your health care provider may also give you more specific instructions. Your treatment has been planned according to current medical practices, but problems sometimes occur. Call your health care provider if you have any problems or questions after your procedure. WHAT TO EXPECT AFTER THE PROCEDURE After your procedure, it is common to have pain or discomfort at the excision site. HOME CARE INSTRUCTIONS  Take over-the-counter and prescription medicines only as told by your health care provider.  Follow instructions from your health care provider about:  How to take care of your excision site. You should keep the site clean, dry, and protected for at least 48 hours.  When and how you should change your bandage (dressing).  When you should remove your dressing.  Removing whatever was used to close your excision site.  Check the excision area every day for signs of infection. Watch for:  Redness, swelling, or pain.  Fluid, blood, or pus.  For bleeding, apply gentle but firm pressure to the area using a folded towel for 20 minutes.  Avoid high-impact exercise and activities until the stitches (sutures) are removed or the area heals.  Follow instructions from your health care provider about how to minimize scarring. Avoid sun exposure until the area has healed. Scarring should lessen over time.  Keep all follow-up visits as told by your health care provider. This is important. SEEK MEDICAL CARE IF:  You have a fever.  You have redness, swelling, or pain at the excision site.  You have fluid, blood, or pus coming from the excision site.  You have ongoing bleeding at the excision site.  You have pain that  does not improve in 2-3 days after your procedure.  You notice skin irregularities or changes in sensation.   This information is not intended to replace advice given to you by your health care provider. Make sure you discuss any questions you have with your health care provider.   Document Released: 07/30/2014 Document Reviewed: 07/30/2014 Elsevier Interactive Patient Education 2016 [facility] Instructions  Activity: Get plenty of rest for the remainder of the day. A responsible adult should stay with you for 24 hours following the procedure.  For the next 24 hours, DO NOT: -Drive a car -Paediatric nurse -Drink alcoholic beverages -Take any medication unless instructed by your physician -Make any legal decisions or sign important papers.  Meals: Start with liquid foods such as gelatin or soup. Progress to regular foods as tolerated. Avoid greasy, spicy, heavy foods. If nausea and/or vomiting occur, drink only clear liquids until the nausea and/or vomiting subsides. Call your physician if vomiting continues.  Special Instructions/Symptoms: Your throat may feel dry or sore from the anesthesia or the breathing tube placed in your throat during surgery. If this causes discomfort, gargle with warm salt  water. The discomfort should disappear within 24 hours.  If you had a scopolamine patch placed behind your ear for the management of post- operative nausea and/or vomiting:  1. The medication in the patch is effective for 72 hours, after which it should be removed.  Wrap patch in a tissue and discard in the trash. Wash hands thoroughly with soap and water. 2. You may remove the patch earlier than 72 hours if you experience unpleasant side effects which may include dry mouth, dizziness or visual disturbances. 3. Avoid touching the patch. Wash your hands with soap and water after contact with the patch.

## 2015-09-05 NOTE — Op Note (Signed)
OPERATIVE REPORT  DATE OF OPERATION: 09/05/2015  PATIENT:  [name]  75 y.o. female  PRE-OPERATIVE DIAGNOSIS:  Left forearm squamous cell cancer   POST-OPERATIVE DIAGNOSIS:  Left forearm squamous cell cancer   FINDINGS:  1.0cm nodular lesion dorsal aspect left forearm.  PROCEDURE:  Procedure(s): EXCISION OF LEFT FOREARM  MASS  SURGEON:  Surgeon(s): Judeth Horn, MD  ASSISTANT: None  ANESTHESIA:   local and MAC  COMPLICATIONS:  None  EBL: <10 ml  BLOOD ADMINISTERED: none  DRAINS: none   SPECIMEN:  Source of Specimen:  Excised oval specimen measuring 8 x 2.5 cm  COUNTS CORRECT:  YES  PROCEDURE DETAILS: The patient was taken to the operating room and placed on the table in supine position. After an adequate amount of IV sedation was given with oxygen supplementation, her left eye was placed in 90 to body then prepped and draped in usual sterile manner.  A proper timeout was performed identifying the patient and procedure to be performed. The area of the excision was marked with a marking pen. She was marked diagonally and an oval pattern from superior lateral or radial to the inferior medial or ulnar area. We anesthetized the area using 1% Xylocaine with epinephrine. Using #15 blade to make the initial and skin incision in this patient is very thin and frail skin. We excised down to the fascia using electrocautery. We then reapproximated the skin using simple stitches of 4-0 nylon. Some of the sutures actually did cut through the skin because of some mild tension and the thinness of her skin. Once it was closed a sterile dressing was applied including Betadine ointment. The specimen was marked with the inferior margin of a long Prolene suture and the radial margin or lateral margin with a short Prolene suture. All needle counts, sponge counts, and instrument counts were correct.  PATIENT DISPOSITION:  PACU - hemodynamically stable.   Caryl Manas 6/9/201712:57 PM

## 2015-09-05 NOTE — Transfer of Care (Signed)
Immediate Anesthesia Transfer of Care Note  Patient: [name]  Procedure(s) Performed: Procedure(s): EXCISION OF LEFT FOREARM  MASS (Left)  Patient Location: PACU  Anesthesia Type:MAC  Level of Consciousness: awake, alert  and oriented  Airway & Oxygen Therapy: Patient Spontanous Breathing and Patient connected to face mask oxygen  Post-op Assessment: Report given to RN and Post -op Vital signs reviewed and stable  Post vital signs: Reviewed and stable  Last Vitals:  Filed Vitals:   09/05/15 1300 09/05/15 1301  BP: 113/74   Pulse: 88   Temp:    Resp: 20 14    Last Pain:  Filed Vitals:   09/05/15 1302  PainSc: 1       Patients Stated Pain Goal: 0 ([id number])  Complications: No apparent anesthesia complications

## 2015-09-05 NOTE — Anesthesia Preprocedure Evaluation (Addendum)
Anesthesia Evaluation  Patient identified by MRN, date of birth, ID band Patient awake    Reviewed: Allergy & Precautions, NPO status , Patient's Chart, lab work & pertinent test results  Airway Mallampati: I  TM Distance: >3 FB Neck ROM: Full    Dental   Pulmonary shortness of breath,    Pulmonary exam normal        Cardiovascular hypertension, Pt. on medications + CAD and + Cardiac Stents  Normal cardiovascular exam     Neuro/Psych Anxiety CVA    GI/Hepatic GERD  Medicated and Controlled,  Endo/Other  diabetes, Type 2, Oral Hypoglycemic Agents  Renal/GU CRFRenal disease     Musculoskeletal   Abdominal   Peds  Hematology   Anesthesia Other Findings   Reproductive/Obstetrics                            Anesthesia Physical Anesthesia Plan  ASA: III  Anesthesia Plan: MAC   Post-op Pain Management:    Induction: Intravenous  Airway Management Planned: Simple Face Mask  Additional Equipment:   Intra-op Plan:   Post-operative Plan:   Informed Consent: I have reviewed the patients History and Physical, chart, labs and discussed the procedure including the risks, benefits and alternatives for the proposed anesthesia with the patient or authorized representative who has indicated his/her understanding and acceptance.     Plan Discussed with: CRNA and Surgeon  Anesthesia Plan Comments:         Anesthesia Quick Evaluation

## 2015-09-05 NOTE — Anesthesia Postprocedure Evaluation (Signed)
Anesthesia Post Note  Patient: [name]  Procedure(s) Performed: Procedure(s) (LRB): EXCISION OF LEFT FOREARM  MASS (Left)  Patient location during evaluation: PACU Anesthesia Type: MAC Level of consciousness: awake and alert Pain management: pain level controlled Vital Signs Assessment: post-procedure vital signs reviewed and stable Respiratory status: spontaneous breathing, nonlabored ventilation, respiratory function stable and patient connected to nasal cannula oxygen Cardiovascular status: stable and blood pressure returned to baseline Anesthetic complications: no    Last Vitals:  Filed Vitals:   09/05/15 1315 09/05/15 1337  BP: 129/76 116/81  Pulse: 48 87  Temp:  36.7 C  Resp: 21 18    Last Pain:  Filed Vitals:   09/05/15 1338  PainSc: 0-No pain                 Cordell Coke DAVID

## 2015-09-08 ENCOUNTER — Encounter (HOSPITAL_BASED_OUTPATIENT_CLINIC_OR_DEPARTMENT_OTHER): Payer: Self-pay | Admitting: General Surgery

## 2015-09-08 ENCOUNTER — Ambulatory Visit (INDEPENDENT_AMBULATORY_CARE_PROVIDER_SITE_OTHER): Payer: Medicare Other | Admitting: Ophthalmology

## 2015-09-08 DIAGNOSIS — H43812 Vitreous degeneration, left eye: Secondary | ICD-10-CM | POA: Diagnosis not present

## 2015-09-08 DIAGNOSIS — E11319 Type 2 diabetes mellitus with unspecified diabetic retinopathy without macular edema: Secondary | ICD-10-CM

## 2015-09-08 DIAGNOSIS — I10 Essential (primary) hypertension: Secondary | ICD-10-CM | POA: Diagnosis not present

## 2015-09-08 DIAGNOSIS — E119 Type 2 diabetes mellitus without complications: Secondary | ICD-10-CM | POA: Diagnosis not present

## 2015-09-08 DIAGNOSIS — H35033 Hypertensive retinopathy, bilateral: Secondary | ICD-10-CM

## 2015-09-08 DIAGNOSIS — E113293 Type 2 diabetes mellitus with mild nonproliferative diabetic retinopathy without macular edema, bilateral: Secondary | ICD-10-CM

## 2015-09-11 DIAGNOSIS — M542 Cervicalgia: Secondary | ICD-10-CM | POA: Diagnosis not present

## 2015-09-11 DIAGNOSIS — M25511 Pain in right shoulder: Secondary | ICD-10-CM | POA: Diagnosis not present

## 2015-09-15 DIAGNOSIS — E1165 Type 2 diabetes mellitus with hyperglycemia: Secondary | ICD-10-CM | POA: Diagnosis not present

## 2015-09-15 DIAGNOSIS — E7800 Pure hypercholesterolemia, unspecified: Secondary | ICD-10-CM | POA: Diagnosis not present

## 2015-09-15 DIAGNOSIS — G609 Hereditary and idiopathic neuropathy, unspecified: Secondary | ICD-10-CM | POA: Diagnosis not present

## 2015-09-15 DIAGNOSIS — E039 Hypothyroidism, unspecified: Secondary | ICD-10-CM | POA: Diagnosis not present

## 2015-09-18 ENCOUNTER — Other Ambulatory Visit: Payer: Self-pay | Admitting: Orthopedic Surgery

## 2015-09-18 DIAGNOSIS — M5412 Radiculopathy, cervical region: Secondary | ICD-10-CM

## 2015-09-19 ENCOUNTER — Telehealth: Payer: Self-pay | Admitting: Cardiology

## 2015-09-19 NOTE — Telephone Encounter (Signed)
Faxed clearance to [facility] for patient to HOLD PLAVIX for 5 days prior to her cervical injection per Dr. Roni Bread   (p) [phone] (f) [phone]

## 2015-09-20 ENCOUNTER — Other Ambulatory Visit: Payer: Self-pay | Admitting: Cardiology

## 2015-09-22 DIAGNOSIS — I10 Essential (primary) hypertension: Secondary | ICD-10-CM | POA: Diagnosis not present

## 2015-09-22 DIAGNOSIS — E1165 Type 2 diabetes mellitus with hyperglycemia: Secondary | ICD-10-CM | POA: Diagnosis not present

## 2015-09-22 DIAGNOSIS — E7800 Pure hypercholesterolemia, unspecified: Secondary | ICD-10-CM | POA: Diagnosis not present

## 2015-09-22 DIAGNOSIS — G609 Hereditary and idiopathic neuropathy, unspecified: Secondary | ICD-10-CM | POA: Diagnosis not present

## 2015-09-22 NOTE — Telephone Encounter (Signed)
Rx request sent to pharmacy.  

## 2015-09-25 ENCOUNTER — Other Ambulatory Visit: Payer: Medicare Other

## 2015-10-01 ENCOUNTER — Other Ambulatory Visit: Payer: Self-pay | Admitting: Cardiology

## 2015-10-03 ENCOUNTER — Ambulatory Visit
Admission: RE | Admit: 2015-10-03 | Discharge: 2015-10-03 | Disposition: A | Discharge status: Home / Self Care | Payer: Medicare Other | Source: Ambulatory Visit | Attending: Orthopedic Surgery | Admitting: Orthopedic Surgery

## 2015-10-03 DIAGNOSIS — M5412 Radiculopathy, cervical region: Secondary | ICD-10-CM

## 2015-10-03 DIAGNOSIS — M47812 Spondylosis without myelopathy or radiculopathy, cervical region: Secondary | ICD-10-CM | POA: Diagnosis not present

## 2015-10-03 MED ORDER — TRIAMCINOLONE ACETONIDE 40 MG/ML IJ SUSP (RADIOLOGY)
60.0000 mg | Freq: Once | INTRAMUSCULAR | Status: AC
  Administered 2015-10-03: 60 mg via EPIDURAL

## 2015-10-03 MED ORDER — IOPAMIDOL (ISOVUE-M 300) INJECTION 61%
1.0000 mL | Freq: Once | INTRAMUSCULAR | Status: AC | PRN
  Administered 2015-10-03: 1 mL via EPIDURAL

## 2015-10-03 NOTE — Discharge Instructions (Signed)

## 2015-10-28 HISTORY — PX: TRANSTHORACIC ECHOCARDIOGRAM: SHX275

## 2015-11-01 ENCOUNTER — Other Ambulatory Visit: Payer: Self-pay | Admitting: Cardiology

## 2015-11-03 NOTE — Telephone Encounter (Signed)
Rx(s) sent to pharmacy electronically.  

## 2015-11-09 ENCOUNTER — Other Ambulatory Visit: Payer: Self-pay | Admitting: Cardiology

## 2015-11-16 ENCOUNTER — Emergency Department (HOSPITAL_COMMUNITY): Payer: Medicare Other

## 2015-11-16 ENCOUNTER — Encounter (HOSPITAL_COMMUNITY): Payer: Self-pay | Admitting: Emergency Medicine

## 2015-11-16 ENCOUNTER — Inpatient Hospital Stay (HOSPITAL_COMMUNITY)
Admission: EM | Admit: 2015-11-16 | Discharge: 2015-11-19 | DRG: 392 | Disposition: A | Discharge status: Home / Self Care | Payer: Medicare Other | Attending: Internal Medicine | Admitting: Internal Medicine

## 2015-11-16 DIAGNOSIS — G43909 Migraine, unspecified, not intractable, without status migrainosus: Secondary | ICD-10-CM | POA: Diagnosis present

## 2015-11-16 DIAGNOSIS — E876 Hypokalemia: Secondary | ICD-10-CM | POA: Diagnosis present

## 2015-11-16 DIAGNOSIS — Z96632 Presence of left artificial wrist joint: Secondary | ICD-10-CM | POA: Diagnosis present

## 2015-11-16 DIAGNOSIS — E1149 Type 2 diabetes mellitus with other diabetic neurological complication: Secondary | ICD-10-CM | POA: Diagnosis present

## 2015-11-16 DIAGNOSIS — G629 Polyneuropathy, unspecified: Secondary | ICD-10-CM | POA: Diagnosis present

## 2015-11-16 DIAGNOSIS — R197 Diarrhea, unspecified: Secondary | ICD-10-CM | POA: Diagnosis not present

## 2015-11-16 DIAGNOSIS — R112 Nausea with vomiting, unspecified: Secondary | ICD-10-CM | POA: Diagnosis not present

## 2015-11-16 DIAGNOSIS — Z96652 Presence of left artificial knee joint: Secondary | ICD-10-CM | POA: Diagnosis present

## 2015-11-16 DIAGNOSIS — E871 Hypo-osmolality and hyponatremia: Secondary | ICD-10-CM | POA: Diagnosis present

## 2015-11-16 DIAGNOSIS — F419 Anxiety disorder, unspecified: Secondary | ICD-10-CM | POA: Diagnosis present

## 2015-11-16 DIAGNOSIS — N184 Chronic kidney disease, stage 4 (severe): Secondary | ICD-10-CM | POA: Diagnosis present

## 2015-11-16 DIAGNOSIS — Z7902 Long term (current) use of antithrombotics/antiplatelets: Secondary | ICD-10-CM

## 2015-11-16 DIAGNOSIS — J9600 Acute respiratory failure, unspecified whether with hypoxia or hypercapnia: Secondary | ICD-10-CM

## 2015-11-16 DIAGNOSIS — A419 Sepsis, unspecified organism: Secondary | ICD-10-CM | POA: Diagnosis not present

## 2015-11-16 DIAGNOSIS — I2510 Atherosclerotic heart disease of native coronary artery without angina pectoris: Secondary | ICD-10-CM | POA: Diagnosis present

## 2015-11-16 DIAGNOSIS — E118 Type 2 diabetes mellitus with unspecified complications: Secondary | ICD-10-CM

## 2015-11-16 DIAGNOSIS — I130 Hypertensive heart and chronic kidney disease with heart failure and stage 1 through stage 4 chronic kidney disease, or unspecified chronic kidney disease: Secondary | ICD-10-CM | POA: Diagnosis present

## 2015-11-16 DIAGNOSIS — Z6825 Body mass index (BMI) 25.0-25.9, adult: Secondary | ICD-10-CM

## 2015-11-16 DIAGNOSIS — N182 Chronic kidney disease, stage 2 (mild): Secondary | ICD-10-CM | POA: Diagnosis present

## 2015-11-16 DIAGNOSIS — E861 Hypovolemia: Secondary | ICD-10-CM | POA: Diagnosis present

## 2015-11-16 DIAGNOSIS — N179 Acute kidney failure, unspecified: Secondary | ICD-10-CM | POA: Diagnosis present

## 2015-11-16 DIAGNOSIS — F0390 Unspecified dementia without behavioral disturbance: Secondary | ICD-10-CM | POA: Diagnosis present

## 2015-11-16 DIAGNOSIS — Z886 Allergy status to analgesic agent status: Secondary | ICD-10-CM

## 2015-11-16 DIAGNOSIS — Z888 Allergy status to other drugs, medicaments and biological substances status: Secondary | ICD-10-CM

## 2015-11-16 DIAGNOSIS — Z85820 Personal history of malignant melanoma of skin: Secondary | ICD-10-CM

## 2015-11-16 DIAGNOSIS — R109 Unspecified abdominal pain: Secondary | ICD-10-CM | POA: Diagnosis not present

## 2015-11-16 DIAGNOSIS — K529 Noninfective gastroenteritis and colitis, unspecified: Principal | ICD-10-CM | POA: Diagnosis present

## 2015-11-16 DIAGNOSIS — I5032 Chronic diastolic (congestive) heart failure: Secondary | ICD-10-CM | POA: Diagnosis present

## 2015-11-16 DIAGNOSIS — Z7984 Long term (current) use of oral hypoglycemic drugs: Secondary | ICD-10-CM

## 2015-11-16 DIAGNOSIS — E1122 Type 2 diabetes mellitus with diabetic chronic kidney disease: Secondary | ICD-10-CM | POA: Diagnosis present

## 2015-11-16 DIAGNOSIS — Z955 Presence of coronary angioplasty implant and graft: Secondary | ICD-10-CM

## 2015-11-16 DIAGNOSIS — Z79899 Other long term (current) drug therapy: Secondary | ICD-10-CM

## 2015-11-16 DIAGNOSIS — I10 Essential (primary) hypertension: Secondary | ICD-10-CM | POA: Diagnosis present

## 2015-11-16 DIAGNOSIS — R0602 Shortness of breath: Secondary | ICD-10-CM | POA: Diagnosis not present

## 2015-11-16 DIAGNOSIS — E785 Hyperlipidemia, unspecified: Secondary | ICD-10-CM | POA: Diagnosis present

## 2015-11-16 DIAGNOSIS — I361 Nonrheumatic tricuspid (valve) insufficiency: Secondary | ICD-10-CM | POA: Diagnosis not present

## 2015-11-16 DIAGNOSIS — E1165 Type 2 diabetes mellitus with hyperglycemia: Secondary | ICD-10-CM | POA: Diagnosis present

## 2015-11-16 DIAGNOSIS — Z66 Do not resuscitate: Secondary | ICD-10-CM | POA: Diagnosis present

## 2015-11-16 DIAGNOSIS — Z9861 Coronary angioplasty status: Secondary | ICD-10-CM

## 2015-11-16 DIAGNOSIS — E46 Unspecified protein-calorie malnutrition: Secondary | ICD-10-CM | POA: Diagnosis present

## 2015-11-16 DIAGNOSIS — Z823 Family history of stroke: Secondary | ICD-10-CM

## 2015-11-16 DIAGNOSIS — Z8249 Family history of ischemic heart disease and other diseases of the circulatory system: Secondary | ICD-10-CM

## 2015-11-16 DIAGNOSIS — R42 Dizziness and giddiness: Secondary | ICD-10-CM

## 2015-11-16 DIAGNOSIS — G2581 Restless legs syndrome: Secondary | ICD-10-CM | POA: Diagnosis present

## 2015-11-16 DIAGNOSIS — E860 Dehydration: Secondary | ICD-10-CM | POA: Diagnosis present

## 2015-11-16 DIAGNOSIS — N189 Chronic kidney disease, unspecified: Secondary | ICD-10-CM

## 2015-11-16 DIAGNOSIS — K219 Gastro-esophageal reflux disease without esophagitis: Secondary | ICD-10-CM | POA: Diagnosis present

## 2015-11-16 DIAGNOSIS — IMO0002 Reserved for concepts with insufficient information to code with codable children: Secondary | ICD-10-CM | POA: Diagnosis present

## 2015-11-16 DIAGNOSIS — R634 Abnormal weight loss: Secondary | ICD-10-CM | POA: Diagnosis present

## 2015-11-16 DIAGNOSIS — G3184 Mild cognitive impairment, so stated: Secondary | ICD-10-CM | POA: Diagnosis present

## 2015-11-16 HISTORY — DX: Nausea with vomiting, unspecified: R11.2

## 2015-11-16 HISTORY — DX: Transient cerebral ischemic attack, unspecified: G45.9

## 2015-11-16 LAB — COMPREHENSIVE METABOLIC PANEL
ALT: 12 U/L — ABNORMAL LOW (ref 14–54)
AST: 22 U/L (ref 15–41)
Albumin: 3.7 g/dL (ref 3.5–5.0)
Alkaline Phosphatase: 80 U/L (ref 38–126)
Anion gap: 14 (ref 5–15)
BUN: 25 mg/dL — ABNORMAL HIGH (ref 6–20)
CO2: 24 mmol/L (ref 22–32)
Calcium: 9.5 mg/dL (ref 8.9–10.3)
Chloride: 88 mmol/L — ABNORMAL LOW (ref 101–111)
Creatinine, Ser: 1.92 mg/dL — ABNORMAL HIGH (ref 0.44–1.00)
GFR calc Af Amer: 28 mL/min — ABNORMAL LOW (ref >60)
GFR calc non Af Amer: 24 mL/min — ABNORMAL LOW (ref >60)
Glucose, Bld: 130 mg/dL — ABNORMAL HIGH (ref 65–99)
Potassium: 2.3 mmol/L — CL (ref 3.5–5.1)
Sodium: 126 mmol/L — ABNORMAL LOW (ref 135–145)
Total Bilirubin: 0.7 mg/dL (ref 0.3–1.2)
Total Protein: 7.1 g/dL (ref 6.5–8.1)

## 2015-11-16 LAB — CBC WITH DIFFERENTIAL/PLATELET
Basophils Absolute: 0 10*3/uL (ref 0.0–0.1)
Basophils Relative: 0 %
Eosinophils Absolute: 0 10*3/uL (ref 0.0–0.7)
Eosinophils Relative: 0 %
HCT: 41.3 % (ref 36.0–46.0)
Hemoglobin: 13.8 g/dL (ref 12.0–15.0)
Lymphocytes Relative: 5 %
Lymphs Abs: 0.9 10*3/uL (ref 0.7–4.0)
MCH: 28.5 pg (ref 26.0–34.0)
MCHC: 33.4 g/dL (ref 30.0–36.0)
MCV: 85.2 fL (ref 78.0–100.0)
Monocytes Absolute: 0.5 10*3/uL (ref 0.1–1.0)
Monocytes Relative: 3 %
Neutro Abs: 16.7 10*3/uL — ABNORMAL HIGH (ref 1.7–7.7)
Neutrophils Relative %: 92 %
Platelets: 319 10*3/uL (ref 150–400)
RBC: 4.85 MIL/uL (ref 3.87–5.11)
RDW: 15.7 % — ABNORMAL HIGH (ref 11.5–15.5)
WBC: 18.1 10*3/uL — ABNORMAL HIGH (ref 4.0–10.5)

## 2015-11-16 LAB — CBG MONITORING, ED: Glucose-Capillary: 128 mg/dL — ABNORMAL HIGH (ref 65–99)

## 2015-11-16 LAB — I-STAT CG4 LACTIC ACID, ED: Lactic Acid, Venous: 3.05 mmol/L (ref 0.5–1.9)

## 2015-11-16 MED ORDER — SODIUM CHLORIDE 0.9 % IV SOLN
1000.0000 mL | Freq: Once | INTRAVENOUS | Status: AC
  Administered 2015-11-16: 1000 mL via INTRAVENOUS

## 2015-11-16 MED ORDER — ONDANSETRON HCL 4 MG/2ML IJ SOLN
4.0000 mg | Freq: Once | INTRAMUSCULAR | Status: AC
  Administered 2015-11-16: 4 mg via INTRAVENOUS
  Filled 2015-11-16: qty 2

## 2015-11-16 MED ORDER — SODIUM CHLORIDE 0.9 % IV SOLN
1000.0000 mL | INTRAVENOUS | Status: DC
  Administered 2015-11-16 – 2015-11-18 (×4): 1000 mL via INTRAVENOUS

## 2015-11-16 MED ORDER — SODIUM CHLORIDE 0.9 % IV BOLUS (SEPSIS)
1000.0000 mL | Freq: Once | INTRAVENOUS | Status: AC
  Administered 2015-11-17: 1000 mL via INTRAVENOUS

## 2015-11-16 MED ORDER — MORPHINE SULFATE (PF) 2 MG/ML IV SOLN
0.5000 mg | Freq: Once | INTRAVENOUS | Status: AC
  Administered 2015-11-16: 0.5 mg via INTRAVENOUS
  Filled 2015-11-16: qty 1

## 2015-11-16 MED ORDER — POTASSIUM CHLORIDE 10 MEQ/100ML IV SOLN
10.0000 meq | INTRAVENOUS | Status: AC
  Administered 2015-11-16 (×2): 10 meq via INTRAVENOUS
  Filled 2015-11-16 (×2): qty 100

## 2015-11-16 MED ORDER — DIATRIZOATE MEGLUMINE & SODIUM 66-10 % PO SOLN
ORAL | Status: AC
  Filled 2015-11-16: qty 30

## 2015-11-16 NOTE — ED Triage Notes (Signed)
Pt. presents with multiple complaints : Daughter reported pt. was found on bathroom floor this evening at home  , with emesis , diarrhea , chest and abdominal pain / generalized weakness and fatigue .

## 2015-11-16 NOTE — ED Notes (Signed)
Patient transported to CT 

## 2015-11-16 NOTE — ED Notes (Signed)
Pt reports sudden onset of N/V/D while traveling- had to pull car over to vomit on side of the road; pt also reports not usually incontinent however had an episode of bowel and urinary incontinence while in triage; pt cleaned up once moved into room D35

## 2015-11-16 NOTE — H&P (Signed)
History and Physical    [name] [id number] DOB: 1940-06-08 DOA: 11/16/2015  PCP: Glo Herring., MD Consultants:  Debbora Presto - Endocrine; [facility] - Urology; Posey Pronto - nephrology; [facility] - cardiology; Amedeo Plenty - GI; Leonie Man - neurology  Patient coming from: home - lives with husband  Chief Complaint: n/v/d - explosive  HPI: [name] is a 75 y.o. female with medical history significant of TIAs, HTN, DM, HLD, stage 2 SKD, CAD, and CHF (Echo 7/15 with preserved EF but grade 1 diastolic dysfunction) presenting with explosive n/v/d which developed acutely this afternoon.  Her brother died on Jul 21, 2022, had family viewing today in [place].  Ate very little at lunch (see below) - a few bites of lima beans and corn.  Started coming home and got very nauseated around [place] and vomited for a long time.  Food particles and a lot of yellow mucus.  Felt a little better after vomiting.  Almost home and it happened again.  After home, just a few minutes before profuse vomiting.  Felt like she was going to pass out.  Unable to even walk back to den.  Then had explosive episode of urine and diarrhea.  Has since had 5 total episodes of diarrhea including 1 at [facility].  Continues to feel a "rock and clawing in the pit of my stomach."  Some improvement since she received medication.    She does report that she has not been eating much because she hasn't been hungry for 3-6 months.  Has gone from 179 to 155 pounds in the last few months.  Feels hungry but then the smell of food makes her sick to her stomach.  Sweats when starting to eat.  No night sweats.  Rare occasions with heart palpitations.  Has occasional right-sided chest tightness, hard to catch a deep breath.  Gets anxious when this happens.  She did have one of those spells before going to [place] today - got dizzy and stumbled - but was feeling by the time of the viewing.  Also gets really bad headaches after her spells.  Has also had 3 spells with  nausea/vomiting - but never diarrhea like today.  Nausea is more chronic/regular but the vomiting is unusual.  She is quite wiped out for several days after these emesis spells.  Far less energy overall in the last few months - not able to work in the yard or do housework. BP has always been high but has been having some episodes of low BP - 115/65.  Chronic dizziness.  Intermittent memory loss - can tell on certain days when she is "not together" - she can tell that she "has not gone below 28/30" since starting seeing him.      ED Course: Diagnoses: N/V/D; hypokalemia; AKI.  Given 3L NS, KCl 10 meq; Zofran 4 mg; morphine 0.5 mg Per Dr. Jeanell Sparrow: Patient received IV fluids and Zofran and was able to stop vomiting. Workup here is significant for hypokalemia. C. difficile is pending. 1 nausea vomiting diarrhea patient improved here. CT of the abdomen shows no acute intra-abdominal pathology.  Leukocytosis at 18,000 2 hypokalemia at 2.3 patient is having IV repletion  3 AK high creatinine elevated to 1.9 2 with last creatinine 1.17 on June 6 of 2017  Review of Systems: As per HPI; otherwise 10 point review of systems reviewed and negative.   Ambulatory Status:  Has never needed a cane or walker  Past Medical History:  Diagnosis Date  . Anxiety   .  Arthritis     osteoarthritis  . CAD S/P percutaneous coronary angioplasty 2007; March 2011   [facility] 3.0 mm 20 mm postdilated 3.6 mm in early mid LAD; status post ISR Cutting Balloon PTCA and March 11 along with PCI of distal mid lesion with a 3.0 mm 12 mm MultiLink vision BMS; the proximal stent causes jailing of SP1 and SP2 with ostial 70-80% lesions  . Cancer (HCC)    Melanoma, Squamous cell Carcinoma  . CHF (congestive heart failure) ([place])   . Chronic kidney disease   . Dementia    mild  . Diabetes mellitus type 2 with neurological manifestations ([place])   . Dyslipidemia, goal LDL below 70   . Full dentures   . GERD (gastroesophageal reflux  disease)   . Headache(784.0)   . History of hematuria    Followed by Dr. Gaynelle Arabian  . Hypertension, essential, benign   . Peripheral neuropathy ([place])   . Shortness of breath dyspnea    with exertion  . TIA (transient ischemic attack)    multiple in the past  . Wears glasses     Past Surgical History:  Procedure Laterality Date  . ABDOMINAL HYSTERECTOMY    . APPENDECTOMY    . BLADDER SUSPENSION    . CARDIAC CATHETERIZATION  02/24/2006   85% stenosis in the proximal portion of LAD-arrangements made for PCI on 02/25/2006  . CARDIAC CATHETERIZATION  02/25/2006   80% LAD lesion stented with a 3x58mm Liberte stent resulting in reduction of 80% lesion to 0% residual  . CARDIAC CATHETERIZATION  04/26/2006   Medical management  . CARDIAC CATHETERIZATION  06/24/2009   60-70% re-stenosis in the proximal LAD. A 3.25x15 cutting balloon, 3 inflations - 14atm-38sec, 13atm-39sec, and 12atm-40sec reduced to less than 10%. 60% stenosis of the mid/distal LAD stented with a 3x46mm Multilink stent.  Marland Kitchen CARDIAC CATHETERIZATION  07/09/2009   Medical management  . CAROTID DOPPLER  06/24/2009   40-59% R ICA stenosis. No significant ICA stenosis noted  . CHOLECYSTECTOMY    . DOPPLER ECHOCARDIOGRAPHY  12/31/2009/July 2015   IsLV size & function, Gr 1 DD w/ mild TR.;; b)09/2013: 55-60%. Grade 1 DD, mild aortic sclerosis  . JOINT REPLACEMENT Left   . LESION REMOVAL Right 03/11/2015   Procedure:  EXCISION OF RIGHT PRE TIBIAL LESION;  Surgeon: Judeth Horn, MD;  Location: [place];  Service: General;  Laterality: Right;  . MASS EXCISION Left 06/10/2014   Procedure: EXCISION LEFT LOWER LEG LESION;  Surgeon: Doreen Salvage, MD;  Location: [place];  Service: General;  Laterality: Left;  Marland Kitchen MASS EXCISION Left 09/05/2015   Procedure: EXCISION OF LEFT FOREARM  MASS;  Surgeon: Judeth Horn, MD;  Location: [facility];  Service: General;  Laterality: Left;  . NM MYOVIEW LTD   11/16/2011   5 beats a PVC during recovery.  No skin or infarction.  Reached 5 METs, EKG negative for ischemia   . SHOULDER ARTHROSCOPY  10/15  . SHOULDER ARTHROSCOPY  1995   left  . TOTAL KNEE ARTHROPLASTY  2005   left  . WRIST ARTHROPLASTY  2010   cancer lt wrist    Social History   Social History  . Marital status: Married    Spouse name: [name]  . Number of children: 3  . Years of education: 2   Occupational History  . retired Retired   Social History Main Topics  . Smoking status: Never Smoker  . Smokeless tobacco: Never  Used  . Alcohol use No  . Drug use: No  . Sexual activity: No   Other Topics Concern  . Not on file   Social History Narrative   She is a married mother of 43, grandmother of 2.  Usually very active and out about the house.  But currently over last 6-8 weeks has been incapacitated.  She never smoked and does not drink alcohol.    Allergies  Allergen Reactions  . Lipitor [Atorvastatin] Other (See Comments)    Bone and muscle pain  . Nsaids Swelling  . Reglan [Metoclopramide]     Tardive dyskinesia; paradoxical reaction not relieved by benadryl  . Tylenol [Acetaminophen] Other (See Comments)    "anything with tylenol in it" patient has Stage II Kidney Failure  . Ultram [Tramadol] Other (See Comments)    Pt has seizure activity with this medication    Family History  Problem Relation Age of Onset  . Diabetes Mother   . Hypertension Mother   . Kidney disease Mother   . Hypertension Father   . Emphysema Father   . Heart attack Father   . Heart disease Father   . Arthritis Sister   . Diabetes Sister   . Hypertension Sister   . Hypertension Brother   . Hyperlipidemia Brother   . Diabetes Brother   . Stroke Brother   . Diabetes Sister   . Hypertension Sister   . Cancer Sister     Cervical cancer  . Hypertension Brother   . Heart attack Daughter   . Hypertension Daughter   . COPD Daughter   . Cancer Daughter     Breast cancer  .  Kidney disease Daughter     Kidney mass    Prior to Admission medications   Medication Sig Start Date End Date Taking? Authorizing Provider  Biotin w/ Vitamins C & E (HAIR SKIN & NAILS GUMMIES PO) Take 2 tablets by mouth every morning.   Yes Historical Provider, MD  bumetanide (BUMEX) 1 MG tablet Take 1.5 tablets (1.5 mg total) by mouth daily. Please schedule appointment for refills. 08/11/15  Yes Leonie Man, MD  Calcium Carb-Cholecalciferol (CALCIUM 600 + D PO) Take 1 tablet by mouth at bedtime.    Yes Historical Provider, MD  cholecalciferol (VITAMIN D) 1000 UNITS tablet Take 1,000 Units by mouth at bedtime.    Yes Historical Provider, MD  clopidogrel (PLAVIX) 75 MG tablet TAKE 1 TABLET DAILY (PLEASE SCHEDULE APPOINTMENT FOR REFILLS) Patient taking differently: TAKE 1 TABLET (75 mg) DAILY (PLEASE SCHEDULE APPOINTMENT FOR REFILLS) 10/01/15  Yes Leonie Man, MD  [facility] Liver Oil 1000 MG CAPS Take 1,000 mg by mouth at bedtime.    Yes Historical Provider, MD  CRANBERRY-VITAMIN C PO Take 1 capsule by mouth at bedtime.    Yes Historical Provider, MD  estrogens, conjugated, (PREMARIN) 0.3 MG tablet Take 0.3 mg by mouth at bedtime.    Yes Historical Provider, MD  Fish Oil-Krill Oil (KRILL OIL PLUS PO) Take 900 mg by mouth daily.   Yes Historical Provider, MD  gabapentin (NEURONTIN) 300 MG capsule Take 300 mg by mouth at bedtime.   Yes Historical Provider, MD  L-Methylfolate-B12-B6-B2 (CEREFOLIN PO) Take 1 tablet by mouth at bedtime.    Yes Historical Provider, MD  losartan-hydrochlorothiazide (HYZAAR) 100-12.5 MG per tablet Take 0.5 tablets by mouth 2 (two) times daily. Taking by mouth daily one half tablet in the morning and one half tablet in the evening  Yes Historical Provider, MD  metFORMIN (GLUCOPHAGE) 500 MG tablet Take 500 mg by mouth 2 (two) times daily with a meal.   Yes Historical Provider, MD  Methylfol-Methylcob-Acetylcyst (CEREFOLIN NAC) 6-2-600 MG TABS Take 1 tablet by mouth  daily. Patient taking differently: Take 1 tablet by mouth at bedtime.  07/24/15  Yes Dennie Bible, NP  metoprolol succinate (TOPROL XL) 50 MG 24 hr tablet Take 1/2 tablet daily. Please schedule appointment for refills. Patient taking differently: Take 25 mg by mouth at bedtime.  09/22/15  Yes Leonie Man, MD  nitroGLYCERIN (NITROLINGUAL) 0.4 MG/SPRAY spray Place 1 spray under the tongue every 5 (five) minutes x 3 doses as needed for chest pain.    Yes Leonie Man, MD  Omega-3 Fatty Acids (FISH OIL) 1200 MG CAPS Take 1,200 mg by mouth at bedtime.    Yes Historical Provider, MD  oxyCODONE (OXY IR/ROXICODONE) 5 MG immediate release tablet Take 1-2 tablets (5-10 mg total) by mouth every 6 (six) hours as needed for moderate pain, severe pain or breakthrough pain. 09/05/15  Yes Judeth Horn, MD  pantoprazole (PROTONIX) 40 MG tablet Take 1 tablet (40 mg total) by mouth daily. PLEASE CONTACT OFFICE FOR ADDITIONAL REFILLS FINAL ATTEMPT 11/03/15  Yes Leonie Man, MD  pramipexole (MIRAPEX) 0.25 MG tablet Take 2 tablets (0.5 mg total) by mouth at bedtime. 07/24/15  Yes Dennie Bible, NP  Resveratrol (RESERVAPAK PO) Take 1 tablet by mouth every evening.    Yes Historical Provider, MD  rosuvastatin (CRESTOR) 20 MG tablet Take 20 mg by mouth at bedtime.   Yes Historical Provider, MD    Physical Exam: Vitals:   11/17/15 0015 11/17/15 0030 11/17/15 0045 11/17/15 0100  BP: 102/62 (!) 86/55 90/56 94/60   Pulse: 108 106 95 99  Resp: 15 15 26 25   Temp:      TempSrc:      SpO2: 98% 98% 98% 100%  Weight:      Height:         General:  Appears calm and comfortable and is NAD; pale, weak, frail, but quite alert and conversant Eyes:  PERRL, EOMI, normal lids, iris ENT:  grossly normal hearing, lips & tongue, mmm Neck:  no LAD, masses or thyromegaly Cardiovascular: tachycardia, no m/r/g. No LE edema.  Respiratory:  CTA bilaterally, no w/r/r. Normal respiratory effort. Abdomen:  soft, ntnd,  NABS Skin:  no rash or induration seen on limited exam Musculoskeletal:  grossly normal tone BUE/BLE, good ROM, no bony abnormality Psychiatric:  grossly normal mood and affect, speech fluent and appropriate, AOx3 Neurologic:  CN 2-12 grossly intact, moves all extremities in coordinated fashion, sensation intact  Labs on Admission: I have personally reviewed following labs and imaging studies  CBC:  Recent Labs Lab 11/16/15 1927  WBC 18.1*  NEUTROABS 16.7*  HGB 13.8  HCT 41.3  MCV 85.2  PLT 99991111   Basic Metabolic Panel:  Recent Labs Lab 11/16/15 1927  NA 126*  K 2.3*  CL 88*  CO2 24  GLUCOSE 130*  BUN 25*  CREATININE 1.92*  CALCIUM 9.5   GFR: Estimated Creatinine Clearance: 23 mL/min (by C-G formula based on SCr of 1.92 mg/dL). Liver Function Tests:  Recent Labs Lab 11/16/15 1927  AST 22  ALT 12*  ALKPHOS 80  BILITOT 0.7  PROT 7.1  ALBUMIN 3.7   No results for input(s): LIPASE, AMYLASE in the last 168 hours. No results for input(s): AMMONIA in the last 168 hours. Coagulation  Profile: No results for input(s): INR, PROTIME in the last 168 hours. Cardiac Enzymes: No results for input(s): CKTOTAL, CKMB, CKMBINDEX, TROPONINI in the last 168 hours. BNP (last 3 results) No results for input(s): PROBNP in the last 8760 hours. HbA1C: No results for input(s): HGBA1C in the last 72 hours. CBG:  Recent Labs Lab 11/16/15 1914  GLUCAP 128*   Lipid Profile: No results for input(s): CHOL, HDL, LDLCALC, TRIG, CHOLHDL, LDLDIRECT in the last 72 hours. Thyroid Function Tests: No results for input(s): TSH, T4TOTAL, FREET4, T3FREE, THYROIDAB in the last 72 hours. Anemia Panel: No results for input(s): VITAMINB12, FOLATE, FERRITIN, TIBC, IRON, RETICCTPCT in the last 72 hours. Urine analysis:    Component Value Date/Time   COLORURINE YELLOW 10/05/2013 [place] 10/05/2013 0059   LABSPEC 1.014 10/05/2013 0059   PHURINE 6.0 10/05/2013 0059   GLUCOSEU  NEGATIVE 10/05/2013 0059   HGBUR NEGATIVE 10/05/2013 0059   BILIRUBINUR NEGATIVE 10/05/2013 0059   KETONESUR NEGATIVE 10/05/2013 0059   PROTEINUR NEGATIVE 10/05/2013 0059   UROBILINOGEN 0.2 10/05/2013 0059   NITRITE NEGATIVE 10/05/2013 0059   LEUKOCYTESUR NEGATIVE 10/05/2013 0059    Creatinine Clearance: Estimated Creatinine Clearance: 23 mL/min (by C-G formula based on SCr of 1.92 mg/dL).  Sepsis Labs: @LABRCNTIP (procalcitonin:4,lacticidven:4) )No results found for this or any previous visit (from the past 240 hour(s)).   Radiological Exams on Admission: Ct Abdomen Pelvis Wo Contrast  Result Date: 11/16/2015 CLINICAL DATA:  Found on bathroom floor.  Abdominal pain. EXAM: CT ABDOMEN AND PELVIS WITHOUT CONTRAST TECHNIQUE: Multidetector CT imaging of the abdomen and pelvis was performed following the standard protocol without IV contrast. COMPARISON:  11/01/2011 FINDINGS: Lower chest and abdominal wall: No acute finding. Coronary atherosclerotic calcification. Hepatobiliary: Presumed 25 mm cyst along the gallbladder fossa.Cholecystectomy. No bile duct distention. Pancreas: Unremarkable. Spleen: Unremarkable. 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 Unremarkable bladder. Stomach/Bowel: No obstruction. No appendicitis or other convincing inflammation. 10 mm fatty density structure in the sigmoid colon was not seen previously and is likely ingested capsule. Mild colonic diverticulosis. Reproductive:Hysterectomy. Possible oophorectomies. No evidence of adnexal mass. Vascular/Lymphatic: Atherosclerotic calcification. No acute vascular finding No mass or adenopathy. Other: No ascites or pneumoperitoneum. Musculoskeletal: Usual degenerative changes. No acute or aggressive finding. IMPRESSION: 1. No explanation for acute abdominal pain. 2. Chronic and postoperative findings are stable from 2013 and described above. Electronically Signed   By: Monte Fantasia M.D.    On: 11/16/2015 22:48    EKG: Independently reviewed.  NSR with rate 97; nonspecific ST changes; repeat requested  Assessment/Plan Principal Problem:   Nausea vomiting and diarrhea Active Problems:   CAD S/P percutaneous coronary angioplasty   Essential hypertension   Dyslipidemia, goal LDL below 70   Dizziness   Mild cognitive impairment   Unexplained weight loss   Sepsis (HCC)   Hyponatremia   Hypokalemia   Acute kidney injury superimposed on chronic kidney disease ([place])   N/V/D -Patient with acute onset of n/v with explosive diarrhea with multiple episodes today including witnessed episode in ER -C. Diff checked, will add on GI panel for other pathogens -Patient placed on enteric precautions for now -Unremarkable CT -Patient with elevated lactate and hypotension which seems to be improving with IVF; has received 3L and will continue to bolus prn elevated lactate (trending) and low BP -Zofran prn n/v but will not provide anti-diarrheal agent at this time -Due to uncertainty of diagnosis and the potential for ongoing worsening of  her condition, will admit to SDU for closer monitoring  Sepsis -Patient with tachycardia, elevated WBC count, elevated RR, elevated lactate, and hypotension.  No apparent source but has not had a UA.  Will do UA and urine culture.  Blood cultures already pending.  Very cognitively intact and without c/o neck stiffness, no LP at this time. -Although there is no source and this may be a diarrheal disease, with her multiple SIRS criteria will initiate broad spectrum antibiotic coverage with Vanc/Zosyn for now.  AKI on CKD -Appears to have stage 2 CKD with baseline creatinine about 1.2 -Current creatinine is 1.92 -This change is likely due to acute volume deficiency associated with rapid GI losses -Already repleted with 3L IVF and receiving ongoing MIVF with plan for boluses as needed for hypotension/elevated lactate -Will repeat BMP in  AM  Hyponatremia -Likely from acute dehydration -Volume repletion already done with 3L as per sepsis protocol and continuing with MIVF with plan for bolus prn hypotension or elevated lactate  Hypokalemia, critical -Patient with excessive GI losses -Appears to have received only 10 mEq KCl -Will give an additional 60 mEq via bolus now and also give 40 mEq PO (although GI absorption may be diminished and patient may be unable to tolerate) -Will monitor on telemetry  H/o CAD -Patient with vague description of chest discomfort as well as SOB -Her initial EKG appeared to have artifact and so will be repeated -Will trend troponin and repeat EKG in AM -Hold Bumex, continue Plavix  Unexplained weight loss -Patient with persistent and vague symptoms including anorexia and weight loss for months now -Was due to see GI as an outpatient on Tuesday but recommend inpatient evaluation in AM based on presenting symptoms -Possibly related to the large number of vitamins she takes?  These are being held  HTN -Hold anti-hypertensives due to hypotension in ER -Restart when needed -Monitor for rebound tachycardia while off BB  DM -Hold Metformin -Cover with SSI -Check A1c  Dizziness -Patient takes an abundance of vitamins - possibly related?  Will hold them  Hyperlipidemia -Continue Crestor  H/o mild cognitive impairment -Patient is incredibly cognizant at this time and is even able to tell me her MMSE score! -I do not appreciate any abnormal cognitive decline in this patient at this time, even when recently hypotensive and quite ill.  DVT prophylaxis: Lovenox  Code Status: DNR - confirmed with patient/family Family Communication: Children present at bedside throughout evaluation  Disposition Plan:  Home once clinically improved Consults called: Suggest GI in AM  Admission status: Admit to SDU    Karmen Bongo MD [facility]  If 7PM-7AM, please contact  night-coverage [email or web address] Password [email or web address]  11/17/2015, 1:31 AM

## 2015-11-16 NOTE — ED Provider Notes (Signed)
[facility] DEPT Provider Note   CSN: [id number] Arrival date & time: 11/16/15  1904     History   Chief Complaint Chief Complaint  Patient presents with  . Chest Pain  . Abdominal Pain  . Dizziness  . Weakness    HPI [name] is a 75 y.o. female.  HPI  This is a 75 year old female history of anxiety, coronary artery disease, melanoma, congestive heart failure, [facility] ED, diabetes who presents today after having onset of abdominal cramping, vomiting, and explosive diarrhea. She states that she was in her usual state of health when she went to a wake this afternoon. Returning in the car with her husband, she began having nausea and abdominal cramping. They had to pull over and she had copious vomiting. She continued to vomit. She was able to get home and began having explosive diarrhea. She is continuing to have diarrhea on arrival to the ED. There is no blood or dark matter in her vomit. It began as food and then became yellowish. The stool was loose and runny and watery. She denies any sick contacts. Other people were present with her from her family at wake. No one else became sick. She has not had any known sick contacts.  Past Medical History:  Diagnosis Date  . Anxiety   . Arthritis     osteoarthritis  . CAD S/P percutaneous coronary angioplasty 2007; March 2011   [facility] 3.0 mm 20 mm postdilated 3.6 mm in early mid LAD; status post ISR Cutting Balloon PTCA and March 11 along with PCI of distal mid lesion with a 3.0 mm 12 mm MultiLink vision BMS; the proximal stent causes jailing of SP1 and SP2 with ostial 70-80% lesions  . Cancer (HCC)    Melanoma, Squamous cell Carcinoma  . CHF (congestive heart failure) ([place])   . Chronic kidney disease   . Dementia    mild  . Diabetes mellitus type 2 with neurological manifestations ([place])   . Dyslipidemia, goal LDL below 70   . Full dentures   . GERD (gastroesophageal reflux disease)   . Headache(784.0)   . History of  hematuria    Followed by Dr. Gaynelle Arabian  . Hypertension, essential, benign   . Melanoma ([place]) 1998   lt foot  . Peripheral neuropathy ([place])   . Shortness of breath dyspnea    with exertion  . Skin cancer    excisions-several  . Stroke ([place])   . Wears glasses     Patient Active Problem List   Diagnosis Date Noted  . Restless legs syndrome 07/24/2015  . Mild cognitive impairment 01/08/2015  . Abnormality of gait 01/08/2015  . Dizziness 12/19/2014  . Preoperative cardiovascular examination 12/11/2013  . GERD (gastroesophageal reflux disease) 10/06/2013  . Nausea vomiting and diarrhea 10/06/2013  . Syncope 10/05/2013  . Near syncope 12/28/2012  . Dementia 12/06/2012  . Chest pain at rest 10/28/2012  . Anxiety 10/28/2012    Class: Acute  . Edema of both legs 10/28/2012  . CAD S/P percutaneous coronary angioplasty   . Essential hypertension   . Dyslipidemia, goal LDL below 70     Past Surgical History:  Procedure Laterality Date  . ABDOMINAL HYSTERECTOMY    . APPENDECTOMY    . BLADDER SUSPENSION    . CARDIAC CATHETERIZATION  02/24/2006   85% stenosis in the proximal portion of LAD-arrangements made for PCI on 02/25/2006  . CARDIAC CATHETERIZATION  02/25/2006   80% LAD lesion stented with a 3x95mm  L[facility] stent resulting in reduction of 80% lesion to 0% residual  . CARDIAC CATHETERIZATION  04/26/2006   Medical management  . CARDIAC CATHETERIZATION  06/24/2009   60-70% re-stenosis in the proximal LAD. A 3.25x15 cutting balloon, 3 inflations - 14atm-38sec, 13atm-39sec, and 12atm-40sec reduced to less than 10%. 60% stenosis of the mid/distal LAD stented with a 3x32mm Multilink stent.  Marland Kitchen CARDIAC CATHETERIZATION  07/09/2009   Medical management  . CAROTID DOPPLER  06/24/2009   40-59% R ICA stenosis. No significant ICA stenosis noted  . CHOLECYSTECTOMY    . DOPPLER ECHOCARDIOGRAPHY  12/31/2009/July 2015   IsLV size & function, Gr 1 DD w/ mild TR.;; b)09/2013: 55-60%. Grade 1 DD,  mild aortic sclerosis  . JOINT REPLACEMENT Left   . LESION REMOVAL Right 03/11/2015   Procedure:  EXCISION OF RIGHT PRE TIBIAL LESION;  Surgeon: Judeth Horn, MD;  Location: [facility];  Service: General;  Laterality: Right;  . MASS EXCISION Left 06/10/2014   Procedure: EXCISION LEFT LOWER LEG LESION;  Surgeon: Doreen Salvage, MD;  Location: [facility];  Service: General;  Laterality: Left;  Marland Kitchen MASS EXCISION Left 09/05/2015   Procedure: EXCISION OF LEFT FOREARM  MASS;  Surgeon: Judeth Horn, MD;  Location: [facility];  Service: General;  Laterality: Left;  . NM MYOVIEW LTD  11/16/2011   5 beats a PVC during recovery.  No skin or infarction.  Reached 5 METs, EKG negative for ischemia   . SHOULDER ARTHROSCOPY  10/15  . SHOULDER ARTHROSCOPY  1995   left  . TOTAL KNEE ARTHROPLASTY  2005   left  . WRIST ARTHROPLASTY  2010   cancer lt wrist    OB History    No data available       Home Medications    Prior to Admission medications   Medication Sig Start Date End Date Taking? Authorizing Provider  Biotin w/ Vitamins C & E (HAIR SKIN & NAILS GUMMIES PO) Take 2 tablets by mouth every morning.   Yes Historical Provider, MD  bumetanide (BUMEX) 1 MG tablet Take 1.5 tablets (1.5 mg total) by mouth daily. Please schedule appointment for refills. 08/11/15  Yes Leonie Man, MD  Calcium Carb-Cholecalciferol (CALCIUM 600 + D PO) Take 1 tablet by mouth at bedtime.    Yes Historical Provider, MD  cholecalciferol (VITAMIN D) 1000 UNITS tablet Take 1,000 Units by mouth at bedtime.    Yes Historical Provider, MD  clopidogrel (PLAVIX) 75 MG tablet TAKE 1 TABLET DAILY (PLEASE SCHEDULE APPOINTMENT FOR REFILLS) Patient taking differently: TAKE 1 TABLET (75 mg) DAILY (PLEASE SCHEDULE APPOINTMENT FOR REFILLS) 10/01/15  Yes Leonie Man, MD  [facility] Liver Oil 1000 MG CAPS Take 1,000 mg by mouth at bedtime.    Yes Historical Provider, MD  CRANBERRY-VITAMIN C PO Take 1 capsule by  mouth at bedtime.    Yes Historical Provider, MD  estrogens, conjugated, (PREMARIN) 0.3 MG tablet Take 0.3 mg by mouth at bedtime.    Yes Historical Provider, MD  Fish Oil-Krill Oil (KRILL OIL PLUS PO) Take 900 mg by mouth daily.   Yes Historical Provider, MD  gabapentin (NEURONTIN) 300 MG capsule Take 300 mg by mouth at bedtime.   Yes Historical Provider, MD  L-Methylfolate-B12-B6-B2 (CEREFOLIN PO) Take 1 tablet by mouth at bedtime.    Yes Historical Provider, MD  losartan-hydrochlorothiazide (HYZAAR) 100-12.5 MG per tablet Take 0.5 tablets by mouth 2 (two) times daily. Taking by mouth daily one half tablet  in the morning and one half tablet in the evening    Yes Historical Provider, MD  metFORMIN (GLUCOPHAGE) 500 MG tablet Take 500 mg by mouth 2 (two) times daily with a meal.   Yes Historical Provider, MD  Methylfol-Methylcob-Acetylcyst (CEREFOLIN NAC) 6-2-600 MG TABS Take 1 tablet by mouth daily. Patient taking differently: Take 1 tablet by mouth at bedtime.  07/24/15  Yes Dennie Bible, NP  metoprolol succinate (TOPROL XL) 50 MG 24 hr tablet Take 1/2 tablet daily. Please schedule appointment for refills. Patient taking differently: Take 25 mg by mouth at bedtime.  09/22/15  Yes Leonie Man, MD  nitroGLYCERIN (NITROLINGUAL) 0.4 MG/SPRAY spray Place 1 spray under the tongue every 5 (five) minutes x 3 doses as needed for chest pain.    Yes Leonie Man, MD  Omega-3 Fatty Acids (FISH OIL) 1200 MG CAPS Take 1,200 mg by mouth at bedtime.    Yes Historical Provider, MD  oxyCODONE (OXY IR/ROXICODONE) 5 MG immediate release tablet Take 1-2 tablets (5-10 mg total) by mouth every 6 (six) hours as needed for moderate pain, severe pain or breakthrough pain. 09/05/15  Yes Judeth Horn, MD  pantoprazole (PROTONIX) 40 MG tablet Take 1 tablet (40 mg total) by mouth daily. PLEASE CONTACT OFFICE FOR ADDITIONAL REFILLS FINAL ATTEMPT 11/03/15  Yes Leonie Man, MD  pramipexole (MIRAPEX) 0.25 MG tablet Take 2  tablets (0.5 mg total) by mouth at bedtime. 07/24/15  Yes Dennie Bible, NP  Resveratrol (RESERVAPAK PO) Take 1 tablet by mouth every evening.    Yes Historical Provider, MD  rosuvastatin (CRESTOR) 20 MG tablet Take 20 mg by mouth at bedtime.   Yes Historical Provider, MD    Family History Family History  Problem Relation Age of Onset  . Diabetes Mother   . Hypertension Mother   . Kidney disease Mother   . Hypertension Father   . Emphysema Father   . Heart attack Father   . Heart disease Father   . Arthritis Sister   . Diabetes Sister   . Hypertension Sister   . Hypertension Brother   . Hyperlipidemia Brother   . Diabetes Brother   . Stroke Brother   . Diabetes Sister   . Hypertension Sister   . Cancer Sister     Cervical cancer  . Hypertension Brother   . Heart attack Daughter   . Hypertension Daughter   . COPD Daughter   . Cancer Daughter     Breast cancer  . Kidney disease Daughter     Kidney mass    Social History Social History  Substance Use Topics  . Smoking status: Never Smoker  . Smokeless tobacco: Not on file  . Alcohol use No     Allergies   Lipitor [atorvastatin]; Nsaids; Reglan [metoclopramide]; Tylenol [acetaminophen]; and Ultram [tramadol]   Review of Systems Review of Systems  All other systems reviewed and are negative.    Physical Exam Updated Vital Signs BP 118/67 (BP Location: Right Arm)   Pulse 98   Temp 98.6 F (37 C) (Rectal)   Resp 18   Ht 5\' 6"  (1.676 m)   Wt 70.8 kg   SpO2 100%   BMI 25.18 kg/m   Physical Exam  Constitutional: She appears well-developed and well-nourished. She appears distressed.  HENT:  Head: Normocephalic and atraumatic.  Right Ear: External ear normal.  Left Ear: External ear normal.  Eyes: Pupils are equal, round, and reactive to light.  Neck: Normal range  of motion. Neck supple.  Cardiovascular: Normal rate, regular rhythm and normal heart sounds.   Pulmonary/Chest: Effort normal.    Abdominal: Soft. Bowel sounds are normal. There is tenderness.    Mild diffuse tenderness palpation most notable in epigastrium  Musculoskeletal: Normal range of motion.  Neurological: She is alert.  Skin: Skin is warm. Capillary refill takes less than 2 seconds.  Psychiatric: She has a normal mood and affect.  Nursing note and vitals reviewed.    ED Treatments / Results  Labs (all labs ordered are listed, but only abnormal results are displayed) Labs Reviewed  CBC WITH DIFFERENTIAL/PLATELET - Abnormal; Notable for the following:       Result Value   WBC 18.1 (*)    RDW 15.7 (*)    Neutro Abs 16.7 (*)    All other components within normal limits  COMPREHENSIVE METABOLIC PANEL - Abnormal; Notable for the following:    Sodium 126 (*)    Potassium 2.3 (*)    Chloride 88 (*)    Glucose, Bld 130 (*)    BUN 25 (*)    Creatinine, Ser 1.92 (*)    ALT 12 (*)    GFR calc non Af Amer 24 (*)    GFR calc Af Amer 28 (*)    All other components within normal limits  CBG MONITORING, ED - Abnormal; Notable for the following:    Glucose-Capillary 128 (*)    All other components within normal limits  I-STAT CG4 LACTIC ACID, ED - Abnormal; Notable for the following:    Lactic Acid, Venous 3.05 (*)    All other components within normal limits  CULTURE, BLOOD (ROUTINE X 2)  CULTURE, BLOOD (ROUTINE X 2)  C DIFFICILE QUICK SCREEN W PCR REFLEX  URINALYSIS, ROUTINE W REFLEX MICROSCOPIC (NOT AT [facility])  I-STAT CG4 LACTIC ACID, ED    EKG  EKG Interpretation  Date/Time:  Sunday November 16 2015 19:09:14 EDT Ventricular Rate:  97 PR Interval:  204 QRS Duration: 108 QT Interval:  380 QTC Calculation: 482 R Axis:   60 Text Interpretation:  Normal sinus rhythm Cannot rule out Anterior infarct , age undetermined Abnormal ECG Confirmed by Leet MD, Kym Scannell [phone]) on 11/16/2015 9:11:16 PM       Radiology Ct Abdomen Pelvis Wo Contrast  Result Date: 11/16/2015 CLINICAL DATA:  Found on  bathroom floor.  Abdominal pain. EXAM: CT ABDOMEN AND PELVIS WITHOUT CONTRAST TECHNIQUE: Multidetector CT imaging of the abdomen and pelvis was performed following the standard protocol without IV contrast. COMPARISON:  11/01/2011 FINDINGS: Lower chest and abdominal wall: No acute finding. Coronary atherosclerotic calcification. Hepatobiliary: Presumed 25 mm cyst along the gallbladder fossa.Cholecystectomy. No bile duct distention. Pancreas: Unremarkable. Spleen: Unremarkable. 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 Unremarkable bladder. Stomach/Bowel: No obstruction. No appendicitis or other convincing inflammation. 10 mm fatty density structure in the sigmoid colon was not seen previously and is likely ingested capsule. Mild colonic diverticulosis. Reproductive:Hysterectomy. Possible oophorectomies. No evidence of adnexal mass. Vascular/Lymphatic: Atherosclerotic calcification. No acute vascular finding No mass or adenopathy. Other: No ascites or pneumoperitoneum. Musculoskeletal: Usual degenerative changes. No acute or aggressive finding. IMPRESSION: 1. No explanation for acute abdominal pain. 2. Chronic and postoperative findings are stable from 2013 and described above. Electronically Signed   By: Monte Fantasia M.D.   On: 11/16/2015 22:48    Procedures Procedures (including critical care time)  Medications Ordered in ED Medications  0.9 %  sodium chloride infusion (  1,000 mLs Intravenous New Bag/Given 11/16/15 2055)    Followed by  0.9 %  sodium chloride infusion (1,000 mLs Intravenous New Bag/Given 11/16/15 2055)    Followed by  0.9 %  sodium chloride infusion (1,000 mLs Intravenous New Bag/Given 11/16/15 2156)  potassium chloride 10 mEq in 100 mL IVPB (10 mEq Intravenous New Bag/Given 11/16/15 2119)  diatrizoate meglumine-sodium (GASTROGRAFIN) 66-10 % solution (not administered)  ondansetron (ZOFRAN) injection 4 mg (4 mg Intravenous Given 11/16/15 1935)    morphine 2 MG/ML injection 0.5 mg (0.5 mg Intravenous Given 11/16/15 2149)     Initial Impression / Assessment and Plan / ED Course  I have reviewed the triage vital signs and the nursing notes.  Pertinent labs & imaging results that were available during my care of the patient were reviewed by me and considered in my medical decision making (see chart for details).  Clinical Course    Patient received IV fluids and Zofran and was able to stop vomiting. Workup here is significant for hypokalemia. C. difficile is pending. 1 nausea vomiting diarrhea patient improved here. CT of the abdomen shows no acute intra-abdominal pathology.  Leukocytosis at 18,000 2 hypokalemia at 2.3 patient is having IV repletion  3 AK high creatinine elevated to 1.9 2 with last creatinine 1.17 on June 6 of 2017 Final Clinical Impressions(s) / ED Diagnoses   Final diagnoses:  Nausea vomiting and diarrhea  Hypokalemia  AKI (acute kidney injury) [facility])    New Prescriptions New Prescriptions   No medications on file     Pattricia Boss, MD 11/20/15 1242

## 2015-11-17 ENCOUNTER — Inpatient Hospital Stay (HOSPITAL_COMMUNITY): Payer: Medicare Other

## 2015-11-17 ENCOUNTER — Encounter (HOSPITAL_COMMUNITY): Payer: Self-pay | Admitting: Internal Medicine

## 2015-11-17 DIAGNOSIS — R197 Diarrhea, unspecified: Secondary | ICD-10-CM

## 2015-11-17 DIAGNOSIS — R112 Nausea with vomiting, unspecified: Secondary | ICD-10-CM

## 2015-11-17 DIAGNOSIS — R634 Abnormal weight loss: Secondary | ICD-10-CM | POA: Diagnosis present

## 2015-11-17 DIAGNOSIS — N184 Chronic kidney disease, stage 4 (severe): Secondary | ICD-10-CM | POA: Diagnosis present

## 2015-11-17 DIAGNOSIS — E876 Hypokalemia: Secondary | ICD-10-CM | POA: Diagnosis present

## 2015-11-17 DIAGNOSIS — I361 Nonrheumatic tricuspid (valve) insufficiency: Secondary | ICD-10-CM

## 2015-11-17 DIAGNOSIS — E871 Hypo-osmolality and hyponatremia: Secondary | ICD-10-CM | POA: Diagnosis present

## 2015-11-17 DIAGNOSIS — A419 Sepsis, unspecified organism: Secondary | ICD-10-CM | POA: Diagnosis present

## 2015-11-17 LAB — CBC
HCT: 27 % — ABNORMAL LOW (ref 36.0–46.0)
Hemoglobin: 8.9 g/dL — ABNORMAL LOW (ref 12.0–15.0)
MCH: 28.3 pg (ref 26.0–34.0)
MCHC: 33 g/dL (ref 30.0–36.0)
MCV: 85.7 fL (ref 78.0–100.0)
Platelets: 180 10*3/uL (ref 150–400)
RBC: 3.15 MIL/uL — ABNORMAL LOW (ref 3.87–5.11)
RDW: 16.1 % — ABNORMAL HIGH (ref 11.5–15.5)
WBC: 11.1 10*3/uL — ABNORMAL HIGH (ref 4.0–10.5)

## 2015-11-17 LAB — ECHOCARDIOGRAM COMPLETE
FS: 26 % — AB (ref 28–44)
Height: 66 in
IVS/LV PW RATIO, ED: 0.92
LA ID, A-P, ES: 30 mm
LA diam end sys: 30 mm
LA diam index: 1.6 cm/m2
LA vol A4C: 55.4 ml
LA vol index: 28 mL/m2
LA vol: 52.3 mL
LV PW d: 8.81 mm — AB (ref 0.6–1.1)
LVOT area: 3.14 cm2
LVOT diameter: 20 mm
Lateral S' vel: 12.3 cm/s
RV sys press: 37 mmHg
Reg peak vel: 293 cm/s
TAPSE: 22.8 mm
TR max vel: 293 cm/s
Weight: 2740.76 oz

## 2015-11-17 LAB — URINALYSIS, ROUTINE W REFLEX MICROSCOPIC
Bilirubin Urine: NEGATIVE
Glucose, UA: NEGATIVE mg/dL
Hgb urine dipstick: NEGATIVE
Ketones, ur: NEGATIVE mg/dL
Leukocytes, UA: NEGATIVE
Nitrite: NEGATIVE
Protein, ur: NEGATIVE mg/dL
Specific Gravity, Urine: 1.012 (ref 1.005–1.030)
pH: 5.5 (ref 5.0–8.0)

## 2015-11-17 LAB — BASIC METABOLIC PANEL
Anion gap: 6 (ref 5–15)
BUN: 21 mg/dL — ABNORMAL HIGH (ref 6–20)
CO2: 20 mmol/L — ABNORMAL LOW (ref 22–32)
Calcium: 6.2 mg/dL — CL (ref 8.9–10.3)
Chloride: 108 mmol/L (ref 101–111)
Creatinine, Ser: 1.36 mg/dL — ABNORMAL HIGH (ref 0.44–1.00)
GFR calc Af Amer: 42 mL/min — ABNORMAL LOW (ref >60)
GFR calc non Af Amer: 36 mL/min — ABNORMAL LOW (ref >60)
Glucose, Bld: 122 mg/dL — ABNORMAL HIGH (ref 65–99)
Potassium: 3.2 mmol/L — ABNORMAL LOW (ref 3.5–5.1)
Sodium: 134 mmol/L — ABNORMAL LOW (ref 135–145)

## 2015-11-17 LAB — PROCALCITONIN: Procalcitonin: 4.38 ng/mL

## 2015-11-17 LAB — TROPONIN I
Troponin I: <0.03 ng/mL (ref <0.03)
Troponin I: <0.03 ng/mL (ref <0.03)
Troponin I: <0.03 ng/mL (ref <0.03)

## 2015-11-17 LAB — GASTROINTESTINAL PANEL BY PCR, STOOL (REPLACES STOOL CULTURE)

## 2015-11-17 LAB — APTT: aPTT: 27 seconds (ref 24–36)

## 2015-11-17 LAB — GLUCOSE, CAPILLARY
Glucose-Capillary: 99 mg/dL (ref 65–99)
Glucose-Capillary: 84 mg/dL (ref 65–99)
Glucose-Capillary: 85 mg/dL (ref 65–99)
Glucose-Capillary: 145 mg/dL — ABNORMAL HIGH (ref 65–99)
Glucose-Capillary: 153 mg/dL — ABNORMAL HIGH (ref 65–99)

## 2015-11-17 LAB — PROTIME-INR
INR: 1.12
Prothrombin Time: 14.5 seconds (ref 11.4–15.2)

## 2015-11-17 LAB — CBG MONITORING, ED: Glucose-Capillary: 122 mg/dL — ABNORMAL HIGH (ref 65–99)

## 2015-11-17 LAB — I-STAT CG4 LACTIC ACID, ED: Lactic Acid, Venous: 2.95 mmol/L (ref 0.5–1.9)

## 2015-11-17 LAB — C DIFFICILE QUICK SCREEN W PCR REFLEX
C Diff antigen: NEGATIVE
C Diff interpretation: NOT DETECTED
C Diff toxin: NEGATIVE

## 2015-11-17 LAB — MRSA PCR SCREENING: MRSA by PCR: NEGATIVE

## 2015-11-17 LAB — LACTIC ACID, PLASMA: Lactic Acid, Venous: 1.7 mmol/L (ref 0.5–1.9)

## 2015-11-17 MED ORDER — PIPERACILLIN-TAZOBACTAM 3.375 G IVPB 30 MIN
3.3750 g | Freq: Once | INTRAVENOUS | Status: AC
  Administered 2015-11-17: 3.375 g via INTRAVENOUS
  Filled 2015-11-17: qty 50

## 2015-11-17 MED ORDER — VANCOMYCIN HCL 10 G IV SOLR
1250.0000 mg | Freq: Once | INTRAVENOUS | Status: AC
  Administered 2015-11-17: 1250 mg via INTRAVENOUS
  Filled 2015-11-17: qty 1250

## 2015-11-17 MED ORDER — PANTOPRAZOLE SODIUM 40 MG PO TBEC
40.0000 mg | DELAYED_RELEASE_TABLET | Freq: Every day | ORAL | Status: DC
  Administered 2015-11-17 – 2015-11-18 (×2): 40 mg via ORAL
  Filled 2015-11-17 (×2): qty 1

## 2015-11-17 MED ORDER — ENOXAPARIN SODIUM 30 MG/0.3ML ~~LOC~~ SOLN
30.0000 mg | Freq: Every day | SUBCUTANEOUS | Status: DC
  Filled 2015-11-17: qty 0.3

## 2015-11-17 MED ORDER — ONDANSETRON HCL 4 MG/2ML IJ SOLN: 4.0000 mg | Freq: Four times a day (QID) | INTRAMUSCULAR | Status: DC | PRN

## 2015-11-17 MED ORDER — GABAPENTIN 300 MG PO CAPS
300.0000 mg | ORAL_CAPSULE | Freq: Every day | ORAL | Status: DC
  Administered 2015-11-17 (×2): 300 mg via ORAL
  Filled 2015-11-17 (×3): qty 1

## 2015-11-17 MED ORDER — ONDANSETRON HCL 4 MG PO TABS: 4.0000 mg | ORAL_TABLET | Freq: Four times a day (QID) | ORAL | Status: DC | PRN

## 2015-11-17 MED ORDER — SODIUM CHLORIDE 0.9 % IV BOLUS (SEPSIS)
1000.0000 mL | Freq: Once | INTRAVENOUS | Status: AC
  Administered 2015-11-17: 1000 mL via INTRAVENOUS

## 2015-11-17 MED ORDER — CLOPIDOGREL BISULFATE 75 MG PO TABS
75.0000 mg | ORAL_TABLET | Freq: Every day | ORAL | Status: DC
  Administered 2015-11-17 – 2015-11-18 (×2): 75 mg via ORAL
  Filled 2015-11-17 (×2): qty 1

## 2015-11-17 MED ORDER — VANCOMYCIN HCL IN DEXTROSE 1-5 GM/200ML-% IV SOLN: 1000.0000 mg | Freq: Once | INTRAVENOUS | Status: DC

## 2015-11-17 MED ORDER — SODIUM CHLORIDE 0.9% FLUSH
3.0000 mL | Freq: Two times a day (BID) | INTRAVENOUS | Status: DC
  Administered 2015-11-17 – 2015-11-18 (×2): 3 mL via INTRAVENOUS

## 2015-11-17 MED ORDER — PIPERACILLIN-TAZOBACTAM 3.375 G IVPB
3.3750 g | Freq: Three times a day (TID) | INTRAVENOUS | Status: DC
  Administered 2015-11-17 – 2015-11-18 (×5): 3.375 g via INTRAVENOUS
  Filled 2015-11-17 (×6): qty 50

## 2015-11-17 MED ORDER — INSULIN ASPART 100 UNIT/ML ~~LOC~~ SOLN
0.0000 [IU] | SUBCUTANEOUS | Status: DC
  Administered 2015-11-17: 1 [IU] via SUBCUTANEOUS
  Filled 2015-11-17: qty 1

## 2015-11-17 MED ORDER — SODIUM CHLORIDE 0.9 % IV SOLN
INTRAVENOUS | Status: DC
  Administered 2015-11-18: 10 mL/h via INTRAVENOUS

## 2015-11-17 MED ORDER — INSULIN ASPART 100 UNIT/ML ~~LOC~~ SOLN
0.0000 [IU] | Freq: Three times a day (TID) | SUBCUTANEOUS | Status: DC
Start: 2015-11-17 — End: 2015-11-18
  Administered 2015-11-17 – 2015-11-18 (×2): 1 [IU] via SUBCUTANEOUS

## 2015-11-17 MED ORDER — METOPROLOL SUCCINATE ER 25 MG PO TB24
25.0000 mg | ORAL_TABLET | Freq: Every day | ORAL | Status: DC
  Administered 2015-11-17: 25 mg via ORAL
  Filled 2015-11-17 (×2): qty 1

## 2015-11-17 MED ORDER — MORPHINE SULFATE (PF) 2 MG/ML IV SOLN: 2.0000 mg | INTRAVENOUS | Status: DC | PRN

## 2015-11-17 MED ORDER — VANCOMYCIN HCL IN DEXTROSE 1-5 GM/200ML-% IV SOLN: 1000.0000 mg | INTRAVENOUS | Status: DC

## 2015-11-17 MED ORDER — BOOST / RESOURCE BREEZE PO LIQD
1.0000 | Freq: Three times a day (TID) | ORAL | Status: DC
  Administered 2015-11-17: 237 mL via ORAL
  Administered 2015-11-17 – 2015-11-19 (×5): 1 via ORAL

## 2015-11-17 MED ORDER — POTASSIUM CHLORIDE 10 MEQ/100ML IV SOLN
10.0000 meq | INTRAVENOUS | Status: AC
  Administered 2015-11-17 (×6): 10 meq via INTRAVENOUS
  Filled 2015-11-17 (×5): qty 100

## 2015-11-17 MED ORDER — POTASSIUM CHLORIDE CRYS ER 20 MEQ PO TBCR
40.0000 meq | EXTENDED_RELEASE_TABLET | Freq: Once | ORAL | Status: AC
  Administered 2015-11-17: 40 meq via ORAL
  Filled 2015-11-17: qty 2

## 2015-11-17 MED ORDER — PHENYLEPHRINE HCL 10 MG/ML IJ SOLN
30.0000 ug/min | INTRAMUSCULAR | Status: DC
  Filled 2015-11-17: qty 1

## 2015-11-17 MED ORDER — ACETAMINOPHEN 650 MG RE SUPP: 650.0000 mg | Freq: Four times a day (QID) | RECTAL | Status: DC | PRN

## 2015-11-17 MED ORDER — ACETAMINOPHEN 325 MG PO TABS
650.0000 mg | ORAL_TABLET | Freq: Four times a day (QID) | ORAL | Status: DC | PRN
  Administered 2015-11-17 – 2015-11-18 (×3): 650 mg via ORAL
  Filled 2015-11-17 (×3): qty 2

## 2015-11-17 MED ORDER — SODIUM CHLORIDE 0.9 % IV SOLN
1.0000 g | Freq: Once | INTRAVENOUS | Status: AC
  Administered 2015-11-17: 1 g via INTRAVENOUS
  Filled 2015-11-17: qty 10

## 2015-11-17 MED ORDER — PRAMIPEXOLE DIHYDROCHLORIDE 0.25 MG PO TABS
0.5000 mg | ORAL_TABLET | Freq: Every day | ORAL | Status: DC
  Administered 2015-11-17 (×2): 0.5 mg via ORAL
  Filled 2015-11-17 (×3): qty 2

## 2015-11-17 MED ORDER — ROSUVASTATIN CALCIUM 20 MG PO TABS
20.0000 mg | ORAL_TABLET | Freq: Every day | ORAL | Status: DC
  Administered 2015-11-17: 20 mg via ORAL
  Filled 2015-11-17 (×2): qty 1

## 2015-11-17 MED ORDER — ENOXAPARIN SODIUM 40 MG/0.4ML ~~LOC~~ SOLN
40.0000 mg | Freq: Every day | SUBCUTANEOUS | Status: DC
  Administered 2015-11-17 – 2015-11-19 (×3): 40 mg via SUBCUTANEOUS
  Filled 2015-11-17 (×3): qty 0.4

## 2015-11-17 NOTE — Progress Notes (Signed)
Pt c/o inability to void, and bladder fullness; bladder scan >975 ml; Dr Titus Mould notified; order received to place foley catheter; foley placed with return of 1235 ml yellow malodorous urine.  Lenor Coffin, RN

## 2015-11-17 NOTE — Progress Notes (Signed)
eLink Physician-Brief Progress Note Patient Name: [name] DOB: [birth year] MRN: [id number]   Date of Service  11/17/2015  HPI/Events of Note  No output for hours H/o obstruction  eICU Interventions  foley     Intervention Category Minor Interventions: Routine modifications to care plan (e.g. PRN medications for pain, fever)  Raylene Miyamoto. 11/17/2015, 3:21 PM

## 2015-11-17 NOTE — Progress Notes (Signed)
Pharmacy Antibiotic Note  [name] is a 75 y.o. female admitted on 11/16/2015 with N/V/D, possible sepsis.  Pharmacy has been consulted for Vancomycin and Zosyn  dosing.  Plan: Vancomycin 1250  mg IV now, then 1000 mg IV q48h  Zosyn 3.375 g IV q8h   Height: 5\' 6"  (167.6 cm) Weight: 156 lb (70.8 kg) IBW/kg (Calculated) : 59.3  Temp (24hrs), Avg:98.6 F (37 C), Min:98.6 F (37 C), Max:98.6 F (37 C)   Recent Labs Lab 11/16/15 1927 11/16/15 2111 11/17/15 0050  WBC 18.1*  --   --   CREATININE 1.92*  --   --   LATICACIDVEN  --  3.05* 2.95*    Estimated Creatinine Clearance: 23 mL/min (by C-G formula based on SCr of 1.92 mg/dL).    Allergies  Allergen Reactions  . Lipitor [Atorvastatin] Other (See Comments)    Bone and muscle pain  . Nsaids Swelling  . Reglan [Metoclopramide]     Tardive dyskinesia; paradoxical reaction not relieved by benadryl  . Tylenol [Acetaminophen] Other (See Comments)    "anything with tylenol in it" patient has Stage II Kidney Failure  . Ultram [Tramadol] Other (See Comments)    Pt has seizure activity with this medication    Caryl Pina 11/17/2015 1:20 AM

## 2015-11-17 NOTE — ED Notes (Signed)
Attempted report 

## 2015-11-17 NOTE — Progress Notes (Signed)
Patient remains hypotensive and is currently on her 5th liter of IVF. Current BP is 80/50 with MAP 61. Pulse has improved and she is no longer tachycardiac so this may simply be a redistribution issue with expected normalization in BP soon. However, given concern for possible sepsis without early antibiotics and slow improvement of hypotension, patient may benefit from pressors.   EKG reviewed and not concerning for acute ischemia (reviewed with Dr. Laneta Simmers). Troponin negative, UA negative, procalcitonin is elevated, repeat lactate due about 0400. Will place PCCM consult for possible need for pressors - patient discussed with Dr. Elsworth Soho.  Carlyon Shadow, M.D.

## 2015-11-17 NOTE — ED Notes (Signed)
MD at bedside. 

## 2015-11-17 NOTE — Consult Note (Addendum)
PULMONARY / CRITICAL CARE MEDICINE   Name: [name] MRN: [id number] DOB: 15-Feb-1941    ADMISSION DATE:  11/16/2015 CONSULTATION DATE:  11/17/15  REFERRING MD:  Thomasenia Bottoms MD  CHIEF COMPLAINT:  Abdominal pain, nausea, vomiting, diarrhea.  HISTORY OF PRESENT ILLNESS:   [name] is an 75 year old with past medical history of coronary artery disease s/p stents, diastolic heart failure, chronic kidney disease, dementia, diabetes. She was admitted on 8/20 with acute onset of nausea, vomiting, diarrhea since afternoon. She had been present at the funeral viewing for her brother and ate a little there. On way to her home she developed explosive GI symptoms. She's had 5 episodes of diarrhea to date. She reports that everyone else who ate the same food do not have the symptoms.  In ED she was found to be in AKI, hyperkalemic, hypotensive even after 5 L of fluid. Current blood pressure is 86/49 (SBP at baseline is 130-140s). Elevated LA on admission. She has received 3 runs of K, zofran and morphine. PCCM called for admission.  PAST MEDICAL HISTORY :  She  has a past medical history of Anxiety; Arthritis; CAD S/P percutaneous coronary angioplasty (2007; March 2011); Cancer [facility]); CHF (congestive heart failure) ([place]); Chronic kidney disease; Dementia; Diabetes mellitus type 2 with neurological manifestations ([place]); Dyslipidemia, goal LDL below 70; Full dentures; GERD (gastroesophageal reflux disease); Headache(784.0); History of hematuria; Hypertension, essential, benign; Peripheral neuropathy ([place]); Shortness of breath dyspnea; TIA (transient ischemic attack); and Wears glasses.  PAST SURGICAL HISTORY: She  has a past surgical history that includes Abdominal hysterectomy; Bladder suspension; Appendectomy; Cholecystectomy; NM MYOVIEW LTD (11/16/2011); doppler echocardiography (12/31/2009/July 2015); CAROTID DOPPLER (06/24/2009); Cardiac catheterization (02/24/2006); Cardiac catheterization (02/25/2006);  Cardiac catheterization (04/26/2006); Cardiac catheterization (06/24/2009); Cardiac catheterization (07/09/2009); Total knee arthroplasty (2005); Shoulder arthroscopy (10/15); Shoulder arthroscopy (1995); Wrist Arthroplasty (2010); Mass excision (Left, 06/10/2014); Joint replacement (Left); Lesion removal (Right, 03/11/2015); and Mass excision (Left, 09/05/2015).  Allergies  Allergen Reactions  . Lipitor [Atorvastatin] Other (See Comments)    Bone and muscle pain  . Nsaids Swelling  . Reglan [Metoclopramide]     Tardive dyskinesia; paradoxical reaction not relieved by benadryl  . Tylenol [Acetaminophen] Other (See Comments)    "anything with tylenol in it" patient has Stage II Kidney Failure  . Ultram [Tramadol] Other (See Comments)    Pt has seizure activity with this medication    No current facility-administered medications on file prior to encounter.    Current Outpatient Prescriptions on File Prior to Encounter  Medication Sig  . bumetanide (BUMEX) 1 MG tablet Take 1.5 tablets (1.5 mg total) by mouth daily. Please schedule appointment for refills.  . Calcium Carb-Cholecalciferol (CALCIUM 600 + D PO) Take 1 tablet by mouth at bedtime.   . cholecalciferol (VITAMIN D) 1000 UNITS tablet Take 1,000 Units by mouth at bedtime.   . clopidogrel (PLAVIX) 75 MG tablet TAKE 1 TABLET DAILY (PLEASE SCHEDULE APPOINTMENT FOR REFILLS) (Patient taking differently: TAKE 1 TABLET (75 mg) DAILY (PLEASE SCHEDULE APPOINTMENT FOR REFILLS))  . Cod Liver Oil 1000 MG CAPS Take 1,000 mg by mouth at bedtime.   Marland Kitchen CRANBERRY-VITAMIN C PO Take 1 capsule by mouth at bedtime.   Marland Kitchen estrogens, conjugated, (PREMARIN) 0.3 MG tablet Take 0.3 mg by mouth at bedtime.   . Fish Oil-Krill Oil (KRILL OIL PLUS PO) Take 900 mg by mouth daily.  Marland Kitchen gabapentin (NEURONTIN) 300 MG capsule Take 300 mg by mouth at bedtime.  Marland Kitchen L-Methylfolate-B12-B6-B2 (CEREFOLIN PO) Take 1 tablet  by mouth at bedtime.   Marland Kitchen losartan-hydrochlorothiazide (HYZAAR)  100-12.5 MG per tablet Take 0.5 tablets by mouth 2 (two) times daily. Taking by mouth daily one half tablet in the morning and one half tablet in the evening   . metFORMIN (GLUCOPHAGE) 500 MG tablet Take 500 mg by mouth 2 (two) times daily with a meal.  . Methylfol-Methylcob-Acetylcyst (CEREFOLIN NAC) 6-2-600 MG TABS Take 1 tablet by mouth daily. (Patient taking differently: Take 1 tablet by mouth at bedtime. )  . metoprolol succinate (TOPROL XL) 50 MG 24 hr tablet Take 1/2 tablet daily. Please schedule appointment for refills. (Patient taking differently: Take 25 mg by mouth at bedtime. )  . nitroGLYCERIN (NITROLINGUAL) 0.4 MG/SPRAY spray Place 1 spray under the tongue every 5 (five) minutes x 3 doses as needed for chest pain.   . Omega-3 Fatty Acids (FISH OIL) 1200 MG CAPS Take 1,200 mg by mouth at bedtime.   Marland Kitchen oxyCODONE (OXY IR/ROXICODONE) 5 MG immediate release tablet Take 1-2 tablets (5-10 mg total) by mouth every 6 (six) hours as needed for moderate pain, severe pain or breakthrough pain.  . pantoprazole (PROTONIX) 40 MG tablet Take 1 tablet (40 mg total) by mouth daily. PLEASE CONTACT OFFICE FOR ADDITIONAL REFILLS FINAL ATTEMPT  . pramipexole (MIRAPEX) 0.25 MG tablet Take 2 tablets (0.5 mg total) by mouth at bedtime.  Marland Kitchen Resveratrol (RESERVAPAK PO) Take 1 tablet by mouth every evening.   . rosuvastatin (CRESTOR) 20 MG tablet Take 20 mg by mouth at bedtime.    FAMILY HISTORY:  Her indicated that her mother is deceased. She indicated that her father is deceased. She indicated that both of her sisters are alive. She indicated that both of her brothers are alive. She indicated that both of her daughters are alive.    SOCIAL HISTORY: She  reports that she has never smoked. She has never used smokeless tobacco. She reports that she does not drink alcohol or use drugs.  REVIEW OF SYSTEMS:   Cramping abdominal pain, nausea, vomiting, diarrhea. Loss of appetite for the past 3 months with 24 lbs  weight loss. Denies any dyspnea, cough, wheezing, sputum production. Denies any chest pain, palpitation. Denies any fevers, chills. All other review of systems is negative  SUBJECTIVE:   VITAL SIGNS: BP (!) 86/49   Pulse 93   Temp 98.6 F (37 C) (Rectal)   Resp 23   Ht 5\' 6"  (1.676 m)   Wt 156 lb (70.8 kg)   SpO2 100%   BMI 25.18 kg/m   HEMODYNAMICS:    VENTILATOR SETTINGS:    INTAKE / OUTPUT: No intake/output data recorded.  PHYSICAL EXAMINATION: General:  Comfortable, no distress Neuro:  No focal deficits HEENT: Edentulous, dry mucus membranes, no thyromegaly, JVD Cardiovascular:  RRR, no MRG, No edema Lungs:  Clear, no wheeze or crackles Abdomen:  Soft, + BS Musculoskeletal:  Normal tone and bulk Skin:  Intact  LABS:  BMET  Recent Labs Lab 11/16/15 1927  NA 126*  K 2.3*  CL 88*  CO2 24  BUN 25*  CREATININE 1.92*  GLUCOSE 130*    Electrolytes  Recent Labs Lab 11/16/15 1927  CALCIUM 9.5    CBC  Recent Labs Lab 11/16/15 1927  WBC 18.1*  HGB 13.8  HCT 41.3  PLT 319    Coag's  Recent Labs Lab 11/17/15 0140  APTT 27  INR 1.12    Sepsis Markers  Recent Labs Lab 11/16/15 2111 11/17/15 0050 11/17/15 0140  LATICACIDVEN  3.05* 2.95*  --   PROCALCITON  --   --  4.38    ABG No results for input(s): PHART, PCO2ART, PO2ART in the last 168 hours.  Liver Enzymes  Recent Labs Lab 11/16/15 1927  AST 22  ALT 12*  ALKPHOS 80  BILITOT 0.7  ALBUMIN 3.7    Cardiac Enzymes  Recent Labs Lab 11/17/15 0140  TROPONINI <0.03    Glucose  Recent Labs Lab 11/16/15 1914  GLUCAP 128*    Imaging Ct Abdomen Pelvis Wo Contrast  Result Date: 11/16/2015 CLINICAL DATA:  Found on bathroom floor.  Abdominal pain. EXAM: CT ABDOMEN AND PELVIS WITHOUT CONTRAST TECHNIQUE: Multidetector CT imaging of the abdomen and pelvis was performed following the standard protocol without IV contrast. COMPARISON:  11/01/2011 FINDINGS: Lower chest  and abdominal wall: No acute finding. Coronary atherosclerotic calcification. Hepatobiliary: Presumed 25 mm cyst along the gallbladder fossa.Cholecystectomy. No bile duct distention. Pancreas: Unremarkable. Spleen: Unremarkable. 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 Unremarkable bladder. Stomach/Bowel: No obstruction. No appendicitis or other convincing inflammation. 10 mm fatty density structure in the sigmoid colon was not seen previously and is likely ingested capsule. Mild colonic diverticulosis. Reproductive:Hysterectomy. Possible oophorectomies. No evidence of adnexal mass. Vascular/Lymphatic: Atherosclerotic calcification. No acute vascular finding No mass or adenopathy. Other: No ascites or pneumoperitoneum. Musculoskeletal: Usual degenerative changes. No acute or aggressive finding. IMPRESSION: 1. No explanation for acute abdominal pain. 2. Chronic and postoperative findings are stable from 2013 and described above. Electronically Signed   By: Monte Fantasia M.D.   On: 11/16/2015 22:48   STUDIES:  CT abd 8/20 > No acute abnormality  CULTURES: Bcx X 2 8/20> Ucx 8/20>  ANTIBIOTICS: Vanco 8/20 > Zosyn 8/20 >  SIGNIFICANT EVENTS:   LINES/TUBES:   DISCUSSION: 75 year old with acute onset of explosive nausea, vomiting, diarrhea, abdominal pain. This is associated with acute kidney injury, hyperkalemia, dehydration, elevated LA. Symptoms suggestive of sepsis from gastroenteritis. CT abdomen is reassuring.  Her blood pressure is still low after 5 lt fluid bolus. She still appears dry on exam. We will admit to the ICU and continue fluid resuscitation but may need pressors initation  ASSESSMENT / PLAN:  PULMONARY A: Stable P:   Supplemental O2 Chest X Pollet.  CARDIOVASCULAR A:  Shock Dehydration Elevated LA H/O CAD, diastolic heart failure P:  Continue fluids 1 more Lt NS now and then 125 cc/hr Start pressors if no improvement  soon.  Check cortisol, echo Trend LA Continue plavix Hold diuretics  RENAL A:   AKI Hypokalemia P:   Monitor urine output and Cr Replete K  GASTROINTESTINAL A:   N/V/D Gastroenteritis P:   Check stool panel C diff  HEMATOLOGIC A:   Leukocytosis from sepsis P:  Monitor  INFECTIOUS A:   Sepsis from GI source P:   Continue vanco, zosyn Follow cultures and Pct  ENDOCRINE A:   Stable P:     NEUROLOGIC A:   Mild dementia P:   Monotpr   FAMILY  - Updates: Pt and daughter updated at bedside - Inter-disciplinary family meet or Palliative Care meeting due by:  8/28  Critical care time- 35 mins.  Marshell Garfinkel MD [facility] Pager [phone] If no answer or after 3pm call: [phone] 11/17/2015, 4:13 AM

## 2015-11-17 NOTE — Progress Notes (Signed)
  Echocardiogram 2D Echocardiogram has been performed.  [name] 11/17/2015, 10:05 AM

## 2015-11-17 NOTE — Progress Notes (Signed)
Pt's c.diff and GI panels are both negative; Shelda Jakes in IP notified; Enteric precautions d/c'd; family and patient notified.  Lenor Coffin, RN

## 2015-11-18 DIAGNOSIS — G43009 Migraine without aura, not intractable, without status migrainosus: Secondary | ICD-10-CM

## 2015-11-18 DIAGNOSIS — G43909 Migraine, unspecified, not intractable, without status migrainosus: Secondary | ICD-10-CM | POA: Diagnosis present

## 2015-11-18 DIAGNOSIS — A419 Sepsis, unspecified organism: Secondary | ICD-10-CM

## 2015-11-18 DIAGNOSIS — E118 Type 2 diabetes mellitus with unspecified complications: Secondary | ICD-10-CM

## 2015-11-18 DIAGNOSIS — IMO0002 Reserved for concepts with insufficient information to code with codable children: Secondary | ICD-10-CM | POA: Diagnosis present

## 2015-11-18 DIAGNOSIS — N189 Chronic kidney disease, unspecified: Secondary | ICD-10-CM | POA: Diagnosis present

## 2015-11-18 DIAGNOSIS — E1165 Type 2 diabetes mellitus with hyperglycemia: Secondary | ICD-10-CM | POA: Diagnosis present

## 2015-11-18 DIAGNOSIS — R197 Diarrhea, unspecified: Secondary | ICD-10-CM | POA: Diagnosis present

## 2015-11-18 DIAGNOSIS — N179 Acute kidney failure, unspecified: Secondary | ICD-10-CM | POA: Diagnosis present

## 2015-11-18 LAB — BASIC METABOLIC PANEL
Anion gap: 7 (ref 5–15)
BUN: 11 mg/dL (ref 6–20)
CO2: 22 mmol/L (ref 22–32)
Calcium: 8 mg/dL — ABNORMAL LOW (ref 8.9–10.3)
Chloride: 110 mmol/L (ref 101–111)
Creatinine, Ser: 1.25 mg/dL — ABNORMAL HIGH (ref 0.44–1.00)
GFR calc Af Amer: 47 mL/min — ABNORMAL LOW (ref >60)
GFR calc non Af Amer: 40 mL/min — ABNORMAL LOW (ref >60)
Glucose, Bld: 105 mg/dL — ABNORMAL HIGH (ref 65–99)
Potassium: 4 mmol/L (ref 3.5–5.1)
Sodium: 139 mmol/L (ref 135–145)

## 2015-11-18 LAB — GLUCOSE, CAPILLARY
Glucose-Capillary: 95 mg/dL (ref 65–99)
Glucose-Capillary: 134 mg/dL — ABNORMAL HIGH (ref 65–99)
Glucose-Capillary: 117 mg/dL — ABNORMAL HIGH (ref 65–99)
Glucose-Capillary: 177 mg/dL — ABNORMAL HIGH (ref 65–99)

## 2015-11-18 LAB — CBC
HCT: 30.5 % — ABNORMAL LOW (ref 36.0–46.0)
Hemoglobin: 9.6 g/dL — ABNORMAL LOW (ref 12.0–15.0)
MCH: 27.4 pg (ref 26.0–34.0)
MCHC: 31.5 g/dL (ref 30.0–36.0)
MCV: 87.1 fL (ref 78.0–100.0)
Platelets: 204 10*3/uL (ref 150–400)
RBC: 3.5 MIL/uL — ABNORMAL LOW (ref 3.87–5.11)
RDW: 16.7 % — ABNORMAL HIGH (ref 11.5–15.5)
WBC: 7.4 10*3/uL (ref 4.0–10.5)

## 2015-11-18 LAB — URINE CULTURE: Culture: 50000 — AB

## 2015-11-18 LAB — PROCALCITONIN: Procalcitonin: 3.75 ng/mL

## 2015-11-18 LAB — HEMOGLOBIN A1C
Hgb A1c MFr Bld: 6 % — ABNORMAL HIGH (ref 4.8–5.6)
Mean Plasma Glucose: 126 mg/dL

## 2015-11-18 LAB — CALCIUM, IONIZED: Calcium, Ionized, Serum: 4.1 mg/dL — ABNORMAL LOW (ref 4.5–5.6)

## 2015-11-18 MED ORDER — SUMATRIPTAN SUCCINATE 6 MG/0.5ML ~~LOC~~ SOLN
6.0000 mg | Freq: Once | SUBCUTANEOUS | Status: AC
  Administered 2015-11-18: 6 mg via SUBCUTANEOUS
  Filled 2015-11-18: qty 0.5

## 2015-11-18 MED ORDER — PRAMIPEXOLE DIHYDROCHLORIDE 0.25 MG PO TABS
0.5000 mg | ORAL_TABLET | Freq: Once | ORAL | Status: AC
  Administered 2015-11-18: 0.5 mg via ORAL
  Filled 2015-11-18: qty 2

## 2015-11-18 MED ORDER — ONDANSETRON HCL 4 MG/2ML IJ SOLN: 4.0000 mg | Freq: Four times a day (QID) | INTRAMUSCULAR | Status: DC | PRN

## 2015-11-18 MED ORDER — CLOPIDOGREL BISULFATE 75 MG PO TABS
75.0000 mg | ORAL_TABLET | Freq: Every day | ORAL | Status: DC
  Administered 2015-11-19: 75 mg via ORAL
  Filled 2015-11-18: qty 1

## 2015-11-18 MED ORDER — LOPERAMIDE HCL 2 MG PO CAPS
2.0000 mg | ORAL_CAPSULE | ORAL | Status: DC | PRN
  Filled 2015-11-18: qty 1

## 2015-11-18 MED ORDER — GABAPENTIN 300 MG PO CAPS
300.0000 mg | ORAL_CAPSULE | Freq: Once | ORAL | Status: AC
  Administered 2015-11-18: 300 mg via ORAL
  Filled 2015-11-18: qty 1

## 2015-11-18 MED ORDER — INSULIN ASPART 100 UNIT/ML ~~LOC~~ SOLN: 0.0000 [IU] | Freq: Three times a day (TID) | SUBCUTANEOUS | Status: DC

## 2015-11-18 MED ORDER — ACETAMINOPHEN 325 MG PO TABS
650.0000 mg | ORAL_TABLET | ORAL | Status: DC | PRN
  Administered 2015-11-18: 650 mg via ORAL
  Filled 2015-11-18: qty 2

## 2015-11-18 MED ORDER — METOPROLOL SUCCINATE ER 25 MG PO TB24
25.0000 mg | ORAL_TABLET | Freq: Every day | ORAL | Status: DC
  Administered 2015-11-18: 25 mg via ORAL
  Filled 2015-11-18: qty 1

## 2015-11-18 MED ORDER — LOPERAMIDE HCL 2 MG PO CAPS
4.0000 mg | ORAL_CAPSULE | Freq: Once | ORAL | Status: AC
  Administered 2015-11-18: 4 mg via ORAL
  Filled 2015-11-18: qty 2

## 2015-11-18 NOTE — Progress Notes (Signed)
Pt transferred from 65M this evening. Alert,oriented and able to voice needs. No concerns expressed at this time

## 2015-11-18 NOTE — Care Management Note (Signed)
Case Management Note  Patient Details  Name: [name] MRN: [id number] Date of Birth: [birth year]  Subjective/Objective:    Pt admitted with angioedema                Action/Plan:  PTA from home with husband independent.  No CM needs identified at this time.  CM will continue to follow for discharge needs   Expected Discharge Date:                  Expected Discharge Plan:  Home/Self Care  In-House Referral:     Discharge planning Services  CM Consult  Post Acute Care Choice:    Choice offered to:     DME Arranged:    DME Agency:     HH Arranged:    HH Agency:     Status of Service:  In process, will continue to follow  If discussed at Long Length of Stay Meetings, dates discussed:    Additional Comments:  Maryclare Labrador, RN 11/18/2015, 1:59 PM

## 2015-11-18 NOTE — Progress Notes (Signed)
Initial Nutrition Assessment  DOCUMENTATION CODES:   Severe malnutrition in context of acute illness/injury  INTERVENTION:  Boost Breeze po TID, each supplement provides 250 kcal and 9 grams of protein. Once diet advanced recommend assessing for appropriateness of Ensure Enlive.   Snacks TID between meals. Discussed importance of adequate protein and calories in meals and snacks (more appropriate once diet advanced and post-discharge).  RD to continue to follow.   NUTRITION DIAGNOSIS:   Malnutrition (Severe) related to acute illness (N/V, diarrhea) as evidenced by energy intake < or equal to 50% for > or equal to 5 days, moderate depletions of muscle mass, moderate depletion of body fat, moderate to severe fluid accumulation, 8 percent weight loss over 2 months.  GOAL:   Patient will meet greater than or equal to 90% of their needs  MONITOR:   PO intake, Supplement acceptance, Diet advancement  REASON FOR ASSESSMENT:   Malnutrition Screening Tool    ASSESSMENT:   75 y.o. female with medical history significant of TIAs, HTN, DM, HLD, stage 2 SKD, CAD, and CHF (Echo 7/15 with preserved EF but grade 1 diastolic dysfunction) presenting with explosive n/v/d which developed acutely afternoon of 8/20. Found to be in AKI, hyperkalemic, hypotensive even after 5 L of fluid.    Assessment completed with patient and her husband. She reports UBW of 179 lbs, and that she has unintentionally lost weight over 2-2.5 months and is now 156 lbs. Per chart she was 169 lbs on 09/05/15 and had lost 5.9 kg (8% body weight) over 2 months. Second weight measurement completed 8/21 is 77.7 kg, likely representing significant fluid resuscitation and not patient's actual body weight. She reports during this time she was eating </= 50% of usual intake due to poor appetite, N/V, and diarrhea. Some days she would take a nutrition supplement (Boost/Glucerna) if intake was low. She follows a CHO modified diet at home  for DM. She has been drinking the [employer], but only one had been coming each day. She reports she is able to drink TID.  Meal Completion: 100% of clear liquid trays  Medications reviewed and include: Novolog sliding scale TID with meals, pantoprazole, NS @ 125 ml/hr.  Labs reviewed: CBG 84-153 past 24 hrs, ionized calcium 4.1 (now s/p 1 g calcium gluconate), HgbA1c 6.0 8/21.  Nutrition-Focused physical exam completed. Findings are moderate fat depletion, moderate muscle depletion, and moderate edema.   Discussed plan with RN. Unsure when diet will be advanced past clear liquids.   Diet Order:  Diet clear liquid Room service appropriate? Yes; Fluid consistency: Thin  Skin:  Reviewed, no issues  Last BM:  11/17/2015  Height:   Ht Readings from Last 1 Encounters:  11/16/15 5\' 6"  (1.676 m)    Weight:   Wt Readings from Last 1 Encounters:  11/17/15 171 lb 4.8 oz (77.7 kg)    Ideal Body Weight:  59.1 kg  BMI:  Body mass index is 27.65 kg/m.  Estimated Nutritional Needs:   Kcal:  1500-1900  Protein:  85-105 grams  Fluid:  >/= 1.5 L/day  EDUCATION NEEDS:   Education needs addressed (Small, frequent meals. Adequate protein and calories for meals and snacks once diet advanced.)  Willey Blade, MS, RD, LDN

## 2015-11-18 NOTE — Progress Notes (Signed)
[name]  [id number] DOB: December 28, 1940 DOA: 11/16/2015 PCP: Glo Herring., MD   Brief Narrative:  75 y.o. 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  Her brother died on 08-Jul-2022, had family viewing today in [place].  Ate very little at lunch (see below) - a few bites of lima beans and corn.  Started coming home and got very nauseated around [place] and vomited for a long time.  Food particles and a lot of yellow mucus.  Felt a little better after vomiting.  Almost home and it happened again.  After home, just a few minutes before profuse vomiting.  Felt like she was going to pass out.  Unable to even walk back to den.  Then had explosive episode of urine and diarrhea.  Has since had 5 total episodes of diarrhea including 1 at [facility].  Continues to feel a "rock and clawing in the pit of my stomach."  Some improvement since she received medication.    She does report that she has not been eating much because she hasn't been hungry for 3-6 months.  Has gone from 179 to 155 pounds in the last few months.  Feels hungry but then the smell of food makes her sick to her stomach.  Sweats when starting to eat.  No night sweats.  Rare occasions with heart palpitations.  Has occasional right-sided chest tightness, hard to catch a deep breath.  Gets anxious when this happens.  She did have one of those spells before going to [place] today - got dizzy and stumbled - but was feeling by the time of the viewing.  Also gets really bad headaches after her spells.  Has also had 3 spells with nausea/vomiting - but never diarrhea like today.  Nausea is more chronic/regular but the vomiting is  unusual.  She is quite wiped out for several days after these emesis spells.  Far less energy overall in the last few months - not able to work in the yard or do housework. BP has always been high but has been having some episodes of low BP - 115/65.  Chronic dizziness.  Intermittent memory loss - can tell on certain days when she is "not together" - she can tell that she "has not gone below 28/30" since starting seeing him.    Subjective: 8/22 A/O 4, sitting in chair comfortably except for has a migraine headache. States in the past used to have severe headaches especially when husband was deployed but has not had any recently. NOTE patient's brother just died on 2022-07-08. Patient now has photophobia with headache centered across forehead.   Assessment & Plan:   Principal Problem:   Nausea vomiting and diarrhea Active Problems:   CAD S/P percutaneous coronary angioplasty   Essential hypertension   Dyslipidemia, goal LDL below 70   Dizziness   Mild cognitive impairment   Unexplained weight loss   Sepsis, unspecified organism (HCC)   Hyponatremia   Hypokalemia   Acute kidney injury superimposed on chronic kidney disease (HCC)   Diarrhea   Acute on chronic kidney failure ([place])   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 (HCC)   Headache, migraine   N/V/D -Patient with acute onset  of n/v with explosive diarrhea with multiple episodes today including witnessed episode in ER -C. Diff checked, will add on GI panel for other pathogens -Patient placed on enteric precautions for now -Unremarkable CT -Patient with elevated lactate and hypotension which seems to be improving with IVF; has received 3L and will continue to bolus prn elevated lactate (trending) and low BP -Zofran prn n/v but will not provide anti-diarrheal agent at this time  Sepsis unspecified organism/GI source? -Continue vancomycin and Zosyn -Patient with tachycardia, elevated WBC count, elevated RR, elevated  lactate, and hypotension.  No apparent source but has not had a UA.  Will do UA and urine culture.  Blood cultures already pending.  Very cognitively intact and without c/o neck stiffness, no LP at this time. -Although there is no source and this may be a diarrheal disease, with her multiple SIRS criteria will initiate broad spectrum antibiotic coverage with Vanc/Zosyn for now.  Diarrhea -Stool cultures negative start Imodium  AKI on CKD(Baseline Cr~ 1.2) Lab Results  Component Value Date   CREATININE 1.25 (H) 11/18/2015   CREATININE 1.36 (H) 11/17/2015   CREATININE 1.92 (H) 11/16/2015  -improved: Near baseline  Hyponatremia -Likely from acute dehydration -Resolved  Hypokalemia, critical -Patient with excessive GI losses -Resolved  CAD  -Patient with vague description of chest discomfort as well as SOB -Her initial EKG appeared to have artifact and so will be repeated -Troponin 3 negative -Plavix 75 mg daily   Unexplained weight loss -Patient with persistent and vague symptoms including anorexia and weight loss for months now -Was due to see GI as an outpatient on Tuesday, upon discharge can be seen as outpatient for workup. CT abdomen pelvis nondiagnostic for abdominal pain.  -Possibly related to the large number of vitamins she takes?  These are being held  HTN -Toprol 25 mg QHS  DM type II controlled with, complications -[id number] Hemoglobin A1c = 6  -Sensitive SSI  Migraine -Most likely stress-related secondary to illness and brothers recent death. -Sumatriptan 6 mg injection 1  Hyperlipidemia -Continue Crestor  H/o mild cognitive impairment -Must be extremely mild as patient very conversant.   DVT prophylaxis: Lovenox Code Status: Per chart review Dr.Jennifer Yates, MD on 11/17/2015, patient establish as DO NOT RESUSCITATE Family Communication: None Disposition Plan: Resolution sepsis   Consultants:  [facility] M  Procedures/Significant Events:  CT  abd 8/20 > No acute abnormality  Cultures 8/20 blood right/left  AC NGTD  8/20 C. difficile negative 8/20 1 GI panel negative 8/21 urine pending 8/21 MRSA by PCR negative   Antimicrobials: Vanco 8/20 > Zosyn 8/20 >   Devices    LINES / TUBES:      Continuous Infusions:     Objective: Vitals:   11/18/15 1600 11/18/15 1700 11/18/15 1800 11/18/15 1839  BP: (!) 153/75 (!) 150/98 (!) 167/78 (!) 159/79  Pulse: 78 83 66 78  Resp: (!) 26 (!) 27 (!) 22 20  Temp:    98.3 F (36.8 C)  TempSrc:    Oral  SpO2: 99% 99% 100% 98%  Weight:    79.8 kg (176 lb)  Height:        Intake/Output Summary (Last 24 hours) at 11/18/15 2141 Last data filed at 11/18/15 1800  Gross per 24 hour  Intake             3955 ml  Output             3150 ml  Net  805 ml   Filed Weights   11/16/15 1940 11/17/15 0630 11/18/15 1839  Weight: 70.8 kg (156 lb) 77.7 kg (171 lb 4.8 oz) 79.8 kg (176 lb)    Examination:  General: A/O 4, positive acute distress secondary to migraine, No acute respiratory distress Eyes: negative scleral hemorrhage, negative anisocoria, negative icterus, positive photophobia ENT: Negative Runny nose, negative gingival bleeding, Neck:  Negative scars, masses, torticollis, lymphadenopathy, JVD Lungs: Clear to auscultation bilaterally without wheezes or crackles Cardiovascular: Regular rate and rhythm without murmur gallop or rub normal S1 and S2 Abdomen: negative abdominal pain, nondistended, positive soft, bowel sounds, no rebound, no ascites, no appreciable mass Extremities: No significant cyanosis, clubbing, or edema bilateral lower extremities Skin: Negative rashes, lesions, ulcers Psychiatric:  Negative depression, negative anxiety, negative fatigue, negative mania  Central nervous system:  Cranial nerves II through XII intact, tongue/uvula midline, all extremities muscle strength 5/5, sensation intact throughout, negative dysarthria, negative expressive  aphasia, negative receptive aphasia.  .     Data Reviewed: Care during the described time interval was provided by me .  I have reviewed this patient's available data, including medical history, events of note, physical examination, and all test results as part of my evaluation. I have personally reviewed and interpreted all radiology studies.  CBC:  Recent Labs Lab 11/16/15 1927 11/17/15 0354 11/18/15 0323  WBC 18.1* 11.1* 7.4  NEUTROABS 16.7*  --   --   HGB 13.8 8.9* 9.6*  HCT 41.3 27.0* 30.5*  MCV 85.2 85.7 87.1  PLT 319 180 0000000   Basic Metabolic Panel:  Recent Labs Lab 11/16/15 1927 11/17/15 0354 11/18/15 0323  NA 126* 134* 139  K 2.3* 3.2* 4.0  CL 88* 108 110  CO2 24 20* 22  GLUCOSE 130* 122* 105*  BUN 25* 21* 11  CREATININE 1.92* 1.36* 1.25*  CALCIUM 9.5 6.2* 8.0*   GFR: Estimated Creatinine Clearance: 40.2 mL/min (by C-G formula based on SCr of 1.25 mg/dL). Liver Function Tests:  Recent Labs Lab 11/16/15 1927  AST 22  ALT 12*  ALKPHOS 80  BILITOT 0.7  PROT 7.1  ALBUMIN 3.7   No results for input(s): LIPASE, AMYLASE in the last 168 hours. No results for input(s): AMMONIA in the last 168 hours. Coagulation Profile:  Recent Labs Lab 11/17/15 0140  INR 1.12   Cardiac Enzymes:  Recent Labs Lab 11/17/15 0140 11/17/15 0354 11/17/15 1343  TROPONINI <0.03 <0.03 <0.03   BNP (last 3 results) No results for input(s): PROBNP in the last 8760 hours. HbA1C:  Recent Labs  11/17/15 0140  HGBA1C 6.0*   CBG:  Recent Labs Lab 11/17/15 1812 11/17/15 2205 11/18/15 0844 11/18/15 1321 11/18/15 1813  GLUCAP 145* 153* 95 134* 117*   Lipid Profile: No results for input(s): CHOL, HDL, LDLCALC, TRIG, CHOLHDL, LDLDIRECT in the last 72 hours. Thyroid Function Tests: No results for input(s): TSH, T4TOTAL, FREET4, T3FREE, THYROIDAB in the last 72 hours. Anemia Panel: No results for input(s): VITAMINB12, FOLATE, FERRITIN, TIBC, IRON, RETICCTPCT in  the last 72 hours. Urine analysis:    Component Value Date/Time   COLORURINE YELLOW 11/17/2015 0209   APPEARANCEUR CLEAR 11/17/2015 0209   LABSPEC 1.012 11/17/2015 0209   PHURINE 5.5 11/17/2015 0209   GLUCOSEU NEGATIVE 11/17/2015 0209   HGBUR NEGATIVE 11/17/2015 0209   BILIRUBINUR NEGATIVE 11/17/2015 0209   KETONESUR NEGATIVE 11/17/2015 0209   PROTEINUR NEGATIVE 11/17/2015 0209   UROBILINOGEN 0.2 10/05/2013 0059   NITRITE NEGATIVE 11/17/2015 0209   LEUKOCYTESUR NEGATIVE  11/17/2015 0209   Sepsis Labs: @LABRCNTIP (procalcitonin:4,lacticidven:4)  ) Recent Results (from the past 240 hour(s))  Blood culture (routine x 2)     Status: None (Preliminary result)   Collection Time: 11/16/15  8:50 PM  Result Value Ref Range Status   Specimen Description BLOOD RIGHT ANTECUBITAL  Final   Special Requests BOTTLES DRAWN AEROBIC AND ANAEROBIC 5CC   Final   Culture NO GROWTH 2 DAYS  Final   Report Status PENDING  Incomplete  Blood culture (routine x 2)     Status: None (Preliminary result)   Collection Time: 11/16/15  8:55 PM  Result Value Ref Range Status   Specimen Description BLOOD LEFT ANTECUBITAL  Final   Special Requests BOTTLES DRAWN AEROBIC AND ANAEROBIC 5CC   Final   Culture NO GROWTH 2 DAYS  Final   Report Status PENDING  Incomplete  C difficile quick scan w PCR reflex     Status: None   Collection Time: 11/16/15  8:58 PM  Result Value Ref Range Status   C Diff antigen NEGATIVE NEGATIVE Final   C Diff toxin NEGATIVE NEGATIVE Final   C Diff interpretation No C. difficile detected.  Final  Culture, Urine     Status: Abnormal   Collection Time: 11/17/15  2:09 AM  Result Value Ref Range Status   Specimen Description URINE, CATHETERIZED  Final   Special Requests NONE  Final   Culture (A)  Final    50,000 COLONIES/mL LACTOBACILLUS SPECIES Standardized susceptibility testing for this organism is not available.    Report Status 11/18/2015 FINAL  Final  MRSA PCR Screening      Status: None   Collection Time: 11/17/15  8:46 AM  Result Value Ref Range Status   MRSA by PCR NEGATIVE NEGATIVE Final    Comment:        The GeneXpert MRSA Assay (FDA approved for NASAL specimens only), is one component of a comprehensive MRSA colonization surveillance program. It is not intended to diagnose MRSA infection nor to guide or monitor treatment for MRSA infections.   Gastrointestinal Panel by PCR , Stool     Status: None   Collection Time: 11/17/15 10:00 AM  Result Value Ref Range Status   Campylobacter species NOT DETECTED NOT DETECTED Final   Plesimonas shigelloides NOT DETECTED NOT DETECTED Final   Salmonella species NOT DETECTED NOT DETECTED Final   Yersinia enterocolitica NOT DETECTED NOT DETECTED Final   Vibrio species NOT DETECTED NOT DETECTED Final   Vibrio cholerae NOT DETECTED NOT DETECTED Final   Enteroaggregative E coli (EAEC) NOT DETECTED NOT DETECTED Final   Enteropathogenic E coli (EPEC) NOT DETECTED NOT DETECTED Final   Enterotoxigenic E coli (ETEC) NOT DETECTED NOT DETECTED Final   Shiga like toxin producing E coli (STEC) NOT DETECTED NOT DETECTED Final   E. coli O157 NOT DETECTED NOT DETECTED Final   Shigella/Enteroinvasive E coli (EIEC) NOT DETECTED NOT DETECTED Final   Cryptosporidium NOT DETECTED NOT DETECTED Final   Cyclospora cayetanensis NOT DETECTED NOT DETECTED Final   Entamoeba histolytica NOT DETECTED NOT DETECTED Final   Giardia lamblia NOT DETECTED NOT DETECTED Final   Adenovirus F40/41 NOT DETECTED NOT DETECTED Final   Astrovirus NOT DETECTED NOT DETECTED Final   Norovirus GI/GII NOT DETECTED NOT DETECTED Final   Rotavirus A NOT DETECTED NOT DETECTED Final   Sapovirus (I, II, IV, and V) NOT DETECTED NOT DETECTED Final         Radiology Studies: Ct Abdomen Pelvis Wo Contrast  Result Date: 11/16/2015 CLINICAL DATA:  Found on bathroom floor.  Abdominal pain. EXAM: CT ABDOMEN AND PELVIS WITHOUT CONTRAST TECHNIQUE: Multidetector  CT imaging of the abdomen and pelvis was performed following the standard protocol without IV contrast. COMPARISON:  11/01/2011 FINDINGS: Lower chest and abdominal wall: No acute finding. Coronary atherosclerotic calcification. Hepatobiliary: Presumed 25 mm cyst along the gallbladder fossa.Cholecystectomy. No bile duct distention. Pancreas: Unremarkable. Spleen: Unremarkable. 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 Unremarkable bladder. Stomach/Bowel: No obstruction. No appendicitis or other convincing inflammation. 10 mm fatty density structure in the sigmoid colon was not seen previously and is likely ingested capsule. Mild colonic diverticulosis. Reproductive:Hysterectomy. Possible oophorectomies. No evidence of adnexal mass. Vascular/Lymphatic: Atherosclerotic calcification. No acute vascular finding No mass or adenopathy. Other: No ascites or pneumoperitoneum. Musculoskeletal: Usual degenerative changes. No acute or aggressive finding. IMPRESSION: 1. No explanation for acute abdominal pain. 2. Chronic and postoperative findings are stable from 2013 and described above. Electronically Signed   By: Monte Fantasia M.D.   On: 11/16/2015 22:48   Dg Chest Port 1 View  Result Date: 11/17/2015 CLINICAL DATA:  Acute respiratory failure. Shortness of breath tonight. EXAM: PORTABLE CHEST 1 VIEW COMPARISON:  Radiographs 09/02/2015. Included lung bases from CT abdomen/pelvis 5 hours prior. FINDINGS: Elevation of the right hemidiaphragm with adjacent atelectasis. Minimal atelectasis at the left lung base. The heart is normal in size. There is atherosclerosis of the thoracic aorta. No pulmonary edema, pleural effusion or pneumothorax. Osseous structures appear intact. IMPRESSION: Elevation right hemidiaphragm with adjacent atelectasis. Atherosclerosis of the thoracic aorta. Electronically Signed   By: Jeb Levering M.D.   On: 11/17/2015 04:41        Scheduled  Meds: . clopidogrel  75 mg Oral Daily  . enoxaparin (LOVENOX) injection  40 mg Subcutaneous Daily  . feeding supplement  1 Container Oral TID BM  . [START ON 11/19/2015] insulin aspart  0-9 Units Subcutaneous TID WC  . metoprolol succinate  25 mg Oral QHS   Continuous Infusions:     LOS: 2 days    Time spent: 40 minutes    Walter Min, Geraldo Docker, MD [facility] Pager [phone]   If 7PM-7AM, please contact night-coverage [email or web address] Password [place] [facility] 11/18/2015, 9:41 PM

## 2015-11-19 LAB — GLUCOSE, CAPILLARY
Glucose-Capillary: 97 mg/dL (ref 65–99)
Glucose-Capillary: 93 mg/dL (ref 65–99)

## 2015-11-19 MED ORDER — LOSARTAN POTASSIUM-HCTZ 100-12.5 MG PO TABS: 0.5000 | ORAL_TABLET | Freq: Two times a day (BID) | ORAL | Status: DC

## 2015-11-19 MED ORDER — MENTHOL 3 MG MT LOZG: 1.0000 | LOZENGE | OROMUCOSAL | Status: DC | PRN

## 2015-11-19 MED ORDER — METOPROLOL SUCCINATE ER 25 MG PO TB24: 25.0000 mg | ORAL_TABLET | Freq: Every day | ORAL | Status: DC

## 2015-11-19 NOTE — Care Management Important Message (Signed)
Important Message  Patient Details  Name: [name] MRN: [id number] Date of Birth: 1940-04-25   Medicare Important Message Given:  Yes    [name] [address] 11/19/2015, 11:55 AM

## 2015-11-19 NOTE — Discharge Summary (Signed)
Physician Discharge Summary  SHAWNDRA SLAYBACK [id number] DOB: [birth year] DOA: 11/16/2015  PCP: Glo Herring., MD  Admit date: 11/16/2015 Discharge date: 11/19/2015  Time spent: 45 minutes  Recommendations for Outpatient Follow-up:  1. PCP Dr.Fusco in 1 week 2. 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)   Headache, migraine   Discharge Condition: stable  Diet recommendation: heart healthy  Filed Weights   11/16/15 1940 11/17/15 0630 11/18/15 1839  Weight: 70.8 kg (156 lb) 77.7 kg (171 lb 4.8 oz) 79.8 kg (176 lb)    History of present illness:  75 y.o.WF PMHx Anxiety,Dementia TIAs, HTN,CAD S/P percutaneous coronary angioplasty (2007; March 2011);  DM Type 2 controlled with complication (neuropathy), HLD, CKD stage 2 , CAD native artery, and Chronic Diastolic CHF (Echo 123456 with preserved EF but grade 1 diastolic dysfunction)   Hospital Course:  N/V/Diarrhea -due to viral gastroenteritis, associated with hypovolemia and hypotension -C diff and GI pathogen panel negative -CT unremarkable -improved with empiric abx, aggressive fluid resuscitation -now resolved  Sepsis  -suspected on admission, but ruled out  AKI on CKD(Baseline Cr~ 1.2) -improved: Near baseline  Hyponatremia -Likely from acute dehydration -Resolved  Hypokalemia, critical -Patient with excessive GI losses -Resolved  CAD  -stable -Troponin 3  negative -Plavix 75 mg daily  Unexplained weight loss -Patient with persistent and vague symptoms including anorexia and weight loss for >3 months now -Was due to see GI as an outpatient on Tuesday, upon discharge can be seen as outpatient for workup. CT abdomen pelvis nondiagnostic for abdominal pain.  -advised to FU with Dr.Magod   HTN -Toprol 25 mg QHS  DM type II controlled with, complications -[id number] Hemoglobin A1c = 6  -Sensitive SSI  Hyperlipidemia -Continue Crestor  H/o mild cognitive impairment -Must be extremely mild as patient very conversant   Consultations:  PCCM  Discharge Exam: Vitals:   11/19/15 0553 11/19/15 1252  BP: (!) 144/82 (!) 154/88  Pulse: 81 70  Resp: 18 18  Temp: 98.4 F (36.9 C) 97.5 F (36.4 C)    General: AAOx3 Cardiovascular: S1S2/RRR Respiratory: CTAB  Discharge Instructions   Discharge Instructions    Diet - low sodium heart healthy    Complete by:  As directed   Diet Carb Modified    Complete by:  As directed   Increase activity slowly    Complete by:  As directed     Current Discharge Medication List    CONTINUE these medications which have CHANGED   Details  losartan-hydrochlorothiazide (HYZAAR) 100-12.5 MG tablet Take 0.5 tablets by mouth 2 (two) times daily. Taking by mouth daily one half tablet in the morning and one half tablet in the evening    metoprolol succinate (TOPROL-XL) 25 MG 24 hr tablet Take 1 tablet (25 mg total) by mouth at bedtime.      CONTINUE these medications which have NOT CHANGED   Details  Biotin w/ Vitamins C & E (  HAIR SKIN & NAILS GUMMIES PO) Take 2 tablets by mouth every morning.    bumetanide (BUMEX) 1 MG tablet Take 1.5 tablets (1.5 mg total) by mouth daily. Please schedule appointment for refills. Qty: 135 tablet, Refills: 0    Calcium Carb-Cholecalciferol (CALCIUM 600 + D PO) Take 1 tablet by mouth at bedtime.     cholecalciferol (VITAMIN D) 1000 UNITS tablet Take 1,000 Units by  mouth at bedtime.     clopidogrel (PLAVIX) 75 MG tablet TAKE 1 TABLET DAILY (PLEASE SCHEDULE APPOINTMENT FOR REFILLS) Qty: 90 tablet, Refills: 2    Cod Liver Oil 1000 MG CAPS Take 1,000 mg by mouth at bedtime.     CRANBERRY-VITAMIN C PO Take 1 capsule by mouth at bedtime.     estrogens, conjugated, (PREMARIN) 0.3 MG tablet Take 0.3 mg by mouth at bedtime.     Fish Oil-Krill Oil (KRILL OIL PLUS PO) Take 900 mg by mouth daily.    gabapentin (NEURONTIN) 300 MG capsule Take 300 mg by mouth at bedtime.    L-Methylfolate-B12-B6-B2 (CEREFOLIN PO) Take 1 tablet by mouth at bedtime.     metFORMIN (GLUCOPHAGE) 500 MG tablet Take 500 mg by mouth 2 (two) times daily with a meal.    nitroGLYCERIN (NITROLINGUAL) 0.4 MG/SPRAY spray Place 1 spray under the tongue every 5 (five) minutes x 3 doses as needed for chest pain.     Omega-3 Fatty Acids (FISH OIL) 1200 MG CAPS Take 1,200 mg by mouth at bedtime.     oxyCODONE (OXY IR/ROXICODONE) 5 MG immediate release tablet Take 1-2 tablets (5-10 mg total) by mouth every 6 (six) hours as needed for moderate pain, severe pain or breakthrough pain. Qty: 40 tablet, Refills: 0    pantoprazole (PROTONIX) 40 MG tablet Take 1 tablet (40 mg total) by mouth daily. PLEASE CONTACT OFFICE FOR ADDITIONAL REFILLS FINAL ATTEMPT Qty: 90 tablet, Refills: 0    pramipexole (MIRAPEX) 0.25 MG tablet Take 2 tablets (0.5 mg total) by mouth at bedtime. Qty: 180 tablet, Refills: 3    Resveratrol (RESERVAPAK PO) Take 1 tablet by mouth every evening.     rosuvastatin (CRESTOR) 20 MG tablet Take 20 mg by mouth at bedtime.      STOP taking these medications     Methylfol-Methylcob-Acetylcyst (CEREFOLIN NAC) 6-2-600 MG TABS        Allergies  Allergen Reactions  . Lipitor [Atorvastatin] Other (See Comments)    Bone and muscle pain  . Nsaids Swelling  . Reglan [Metoclopramide]     Tardive dyskinesia; paradoxical reaction not relieved by benadryl  . Tylenol [Acetaminophen]  Other (See Comments)    "anything with tylenol in it" patient has Stage II Kidney Failure  . Ultram [Tramadol] Other (See Comments)    Pt has seizure activity with this medication   Follow-up Information    FUSCO,LAWRENCE J., MD. Schedule an appointment as soon as possible for a visit in 1 week(s).   Specialty:  Internal Medicine Contact information: [address] [place] [id number] [phone]        [facility] E, MD. Schedule an appointment as soon as possible for a visit in 2 week(s).   Specialty:  Gastroenterology Contact information: [id number] [place] [address] [phone]            The results of significant diagnostics from this hospitalization (including imaging, microbiology, ancillary and laboratory) are listed below for reference.    Significant Diagnostic Studies: Ct Abdomen Pelvis Wo Contrast  Result Date: 11/16/2015 CLINICAL DATA:  Found on bathroom floor.  Abdominal pain. EXAM: CT ABDOMEN AND PELVIS WITHOUT CONTRAST TECHNIQUE: Multidetector CT imaging of the abdomen and pelvis was performed following the standard protocol without IV contrast. COMPARISON:  11/01/2011 FINDINGS: Lower chest and abdominal wall: No acute finding. Coronary atherosclerotic calcification. Hepatobiliary: Presumed 25 mm cyst along the gallbladder fossa.Cholecystectomy. No bile duct distention. Pancreas: Unremarkable. Spleen: Unremarkable. 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 Unremarkable bladder. Stomach/Bowel: No obstruction. No appendicitis or other convincing inflammation. 10 mm fatty density structure in the sigmoid colon was not seen previously and is likely ingested capsule. Mild colonic diverticulosis. Reproductive:Hysterectomy. Possible oophorectomies. No evidence of adnexal mass. Vascular/Lymphatic: Atherosclerotic calcification. No acute vascular finding No mass or adenopathy. Other: No ascites or  pneumoperitoneum. Musculoskeletal: Usual degenerative changes. No acute or aggressive finding. IMPRESSION: 1. No explanation for acute abdominal pain. 2. Chronic and postoperative findings are stable from 2013 and described above. Electronically Signed   By: Monte Fantasia M.D.   On: 11/16/2015 22:48   Dg Chest Port 1 View  Result Date: 11/17/2015 CLINICAL DATA:  Acute respiratory failure. Shortness of breath tonight. EXAM: PORTABLE CHEST 1 VIEW COMPARISON:  Radiographs 09/02/2015. Included lung bases from CT abdomen/pelvis 5 hours prior. FINDINGS: Elevation of the right hemidiaphragm with adjacent atelectasis. Minimal atelectasis at the left lung base. The heart is normal in size. There is atherosclerosis of the thoracic aorta. No pulmonary edema, pleural effusion or pneumothorax. Osseous structures appear intact. IMPRESSION: Elevation right hemidiaphragm with adjacent atelectasis. Atherosclerosis of the thoracic aorta. Electronically Signed   By: Jeb Levering M.D.   On: 11/17/2015 04:41    Microbiology: Recent Results (from the past 240 hour(s))  Blood culture (routine x 2)     Status: None (Preliminary result)   Collection Time: 11/16/15  8:50 PM  Result Value Ref Range Status   Specimen Description BLOOD RIGHT ANTECUBITAL  Final   Special Requests BOTTLES DRAWN AEROBIC AND ANAEROBIC 5CC   Final   Culture NO GROWTH 3 DAYS  Final   Report Status PENDING  Incomplete  Blood culture (routine x 2)     Status: None (Preliminary result)   Collection Time: 11/16/15  8:55 PM  Result Value Ref Range Status   Specimen Description BLOOD LEFT ANTECUBITAL  Final   Special Requests BOTTLES DRAWN AEROBIC AND ANAEROBIC 5CC   Final   Culture NO GROWTH 3 DAYS  Final   Report Status PENDING  Incomplete  C difficile quick scan w PCR reflex     Status: None   Collection Time: 11/16/15  8:58 PM  Result Value Ref Range Status   C Diff antigen NEGATIVE NEGATIVE Final   C Diff toxin NEGATIVE NEGATIVE Final    C Diff interpretation No C. difficile detected.  Final  Culture, Urine     Status: Abnormal   Collection Time: 11/17/15  2:09 AM  Result Value Ref Range Status   Specimen Description URINE, CATHETERIZED  Final   Special Requests NONE  Final   Culture (A)  Final    50,000 COLONIES/mL LACTOBACILLUS SPECIES Standardized susceptibility testing for this organism is not available.    Report Status 11/18/2015 FINAL  Final  MRSA PCR Screening     Status: None   Collection Time: 11/17/15  8:46 AM  Result Value Ref Range Status   MRSA by PCR NEGATIVE NEGATIVE Final    Comment:        The GeneXpert  MRSA Assay (FDA approved for NASAL specimens only), is one component of a comprehensive MRSA colonization surveillance program. It is not intended to diagnose MRSA infection nor to guide or monitor treatment for MRSA infections.   Gastrointestinal Panel by PCR , Stool     Status: None   Collection Time: 11/17/15 10:00 AM  Result Value Ref Range Status   Campylobacter species NOT DETECTED NOT DETECTED Final   Plesimonas shigelloides NOT DETECTED NOT DETECTED Final   Salmonella species NOT DETECTED NOT DETECTED Final   Yersinia enterocolitica NOT DETECTED NOT DETECTED Final   Vibrio species NOT DETECTED NOT DETECTED Final   Vibrio cholerae NOT DETECTED NOT DETECTED Final   Enteroaggregative E coli (EAEC) NOT DETECTED NOT DETECTED Final   Enteropathogenic E coli (EPEC) NOT DETECTED NOT DETECTED Final   Enterotoxigenic E coli (ETEC) NOT DETECTED NOT DETECTED Final   Shiga like toxin producing E coli (STEC) NOT DETECTED NOT DETECTED Final   E. coli O157 NOT DETECTED NOT DETECTED Final   Shigella/Enteroinvasive E coli (EIEC) NOT DETECTED NOT DETECTED Final   Cryptosporidium NOT DETECTED NOT DETECTED Final   Cyclospora cayetanensis NOT DETECTED NOT DETECTED Final   Entamoeba histolytica NOT DETECTED NOT DETECTED Final   Giardia lamblia NOT DETECTED NOT DETECTED Final   Adenovirus F40/41 NOT  DETECTED NOT DETECTED Final   Astrovirus NOT DETECTED NOT DETECTED Final   Norovirus GI/GII NOT DETECTED NOT DETECTED Final   Rotavirus A NOT DETECTED NOT DETECTED Final   Sapovirus (I, II, IV, and V) NOT DETECTED NOT DETECTED Final     Labs: Basic Metabolic Panel:  Recent Labs Lab 11/16/15 1927 11/17/15 0354 11/18/15 0323  NA 126* 134* 139  K 2.3* 3.2* 4.0  CL 88* 108 110  CO2 24 20* 22  GLUCOSE 130* 122* 105*  BUN 25* 21* 11  CREATININE 1.92* 1.36* 1.25*  CALCIUM 9.5 6.2* 8.0*   Liver Function Tests:  Recent Labs Lab 11/16/15 1927  AST 22  ALT 12*  ALKPHOS 80  BILITOT 0.7  PROT 7.1  ALBUMIN 3.7   No results for input(s): LIPASE, AMYLASE in the last 168 hours. No results for input(s): AMMONIA in the last 168 hours. CBC:  Recent Labs Lab 11/16/15 1927 11/17/15 0354 11/18/15 0323  WBC 18.1* 11.1* 7.4  NEUTROABS 16.7*  --   --   HGB 13.8 8.9* 9.6*  HCT 41.3 27.0* 30.5*  MCV 85.2 85.7 87.1  PLT 319 180 204   Cardiac Enzymes:  Recent Labs Lab 11/17/15 0140 11/17/15 0354 11/17/15 1343  TROPONINI <0.03 <0.03 <0.03   BNP: BNP (last 3 results) No results for input(s): BNP in the last 8760 hours.  ProBNP (last 3 results) No results for input(s): PROBNP in the last 8760 hours.  CBG:  Recent Labs Lab 11/18/15 1321 11/18/15 1813 11/18/15 2145 11/19/15 0758 11/19/15 1210  GLUCAP 134* 117* 177* 97 93       Signed:  Deep Bonawitz MD.  [facility] 11/19/2015, 3:42 PM

## 2015-11-19 NOTE — Progress Notes (Signed)
Went over discharge education with pt before discharge with pt verbalizing understanding the teaching content. Pt left unit in stable condition with daughter who provided transportation

## 2015-11-21 LAB — CULTURE, BLOOD (ROUTINE X 2)
Culture: NO GROWTH
Culture: NO GROWTH

## 2015-11-25 ENCOUNTER — Other Ambulatory Visit: Payer: Self-pay | Admitting: Internal Medicine

## 2015-11-25 DIAGNOSIS — N183 Chronic kidney disease, stage 3 (moderate): Secondary | ICD-10-CM | POA: Diagnosis not present

## 2015-11-25 DIAGNOSIS — R51 Headache: Principal | ICD-10-CM

## 2015-11-25 DIAGNOSIS — I959 Hypotension, unspecified: Secondary | ICD-10-CM | POA: Diagnosis not present

## 2015-11-25 DIAGNOSIS — R519 Headache, unspecified: Secondary | ICD-10-CM

## 2015-11-25 DIAGNOSIS — Z6825 Body mass index (BMI) 25.0-25.9, adult: Secondary | ICD-10-CM | POA: Diagnosis not present

## 2015-11-25 DIAGNOSIS — Z1389 Encounter for screening for other disorder: Secondary | ICD-10-CM | POA: Diagnosis not present

## 2015-11-25 DIAGNOSIS — I10 Essential (primary) hypertension: Secondary | ICD-10-CM | POA: Diagnosis not present

## 2015-11-25 DIAGNOSIS — E860 Dehydration: Secondary | ICD-10-CM | POA: Diagnosis not present

## 2015-11-25 DIAGNOSIS — E1165 Type 2 diabetes mellitus with hyperglycemia: Secondary | ICD-10-CM | POA: Diagnosis not present

## 2015-11-25 DIAGNOSIS — E663 Overweight: Secondary | ICD-10-CM | POA: Diagnosis not present

## 2015-11-25 DIAGNOSIS — E871 Hypo-osmolality and hyponatremia: Secondary | ICD-10-CM | POA: Diagnosis not present

## 2015-11-25 DIAGNOSIS — R1110 Vomiting, unspecified: Secondary | ICD-10-CM | POA: Diagnosis not present

## 2015-11-25 DIAGNOSIS — E876 Hypokalemia: Secondary | ICD-10-CM | POA: Diagnosis not present

## 2015-11-28 ENCOUNTER — Ambulatory Visit
Admission: RE | Admit: 2015-11-28 | Discharge: 2015-11-28 | Disposition: A | Discharge status: Home / Self Care | Payer: Medicare Other | Source: Ambulatory Visit | Attending: Internal Medicine | Admitting: Internal Medicine

## 2015-11-28 DIAGNOSIS — R51 Headache: Principal | ICD-10-CM

## 2015-11-28 DIAGNOSIS — R42 Dizziness and giddiness: Secondary | ICD-10-CM | POA: Diagnosis not present

## 2015-11-28 DIAGNOSIS — R519 Headache, unspecified: Secondary | ICD-10-CM

## 2015-12-01 IMAGING — CR DG CHEST 2V
2 series · 2 of 2 positions shown · non-contrast
Comparison: None.

CLINICAL DATA: Preoperative evaluation for skin cancer lesion left
lower leg

EXAM:
CHEST  2 VIEW

[view not recorded (1 of 2)]
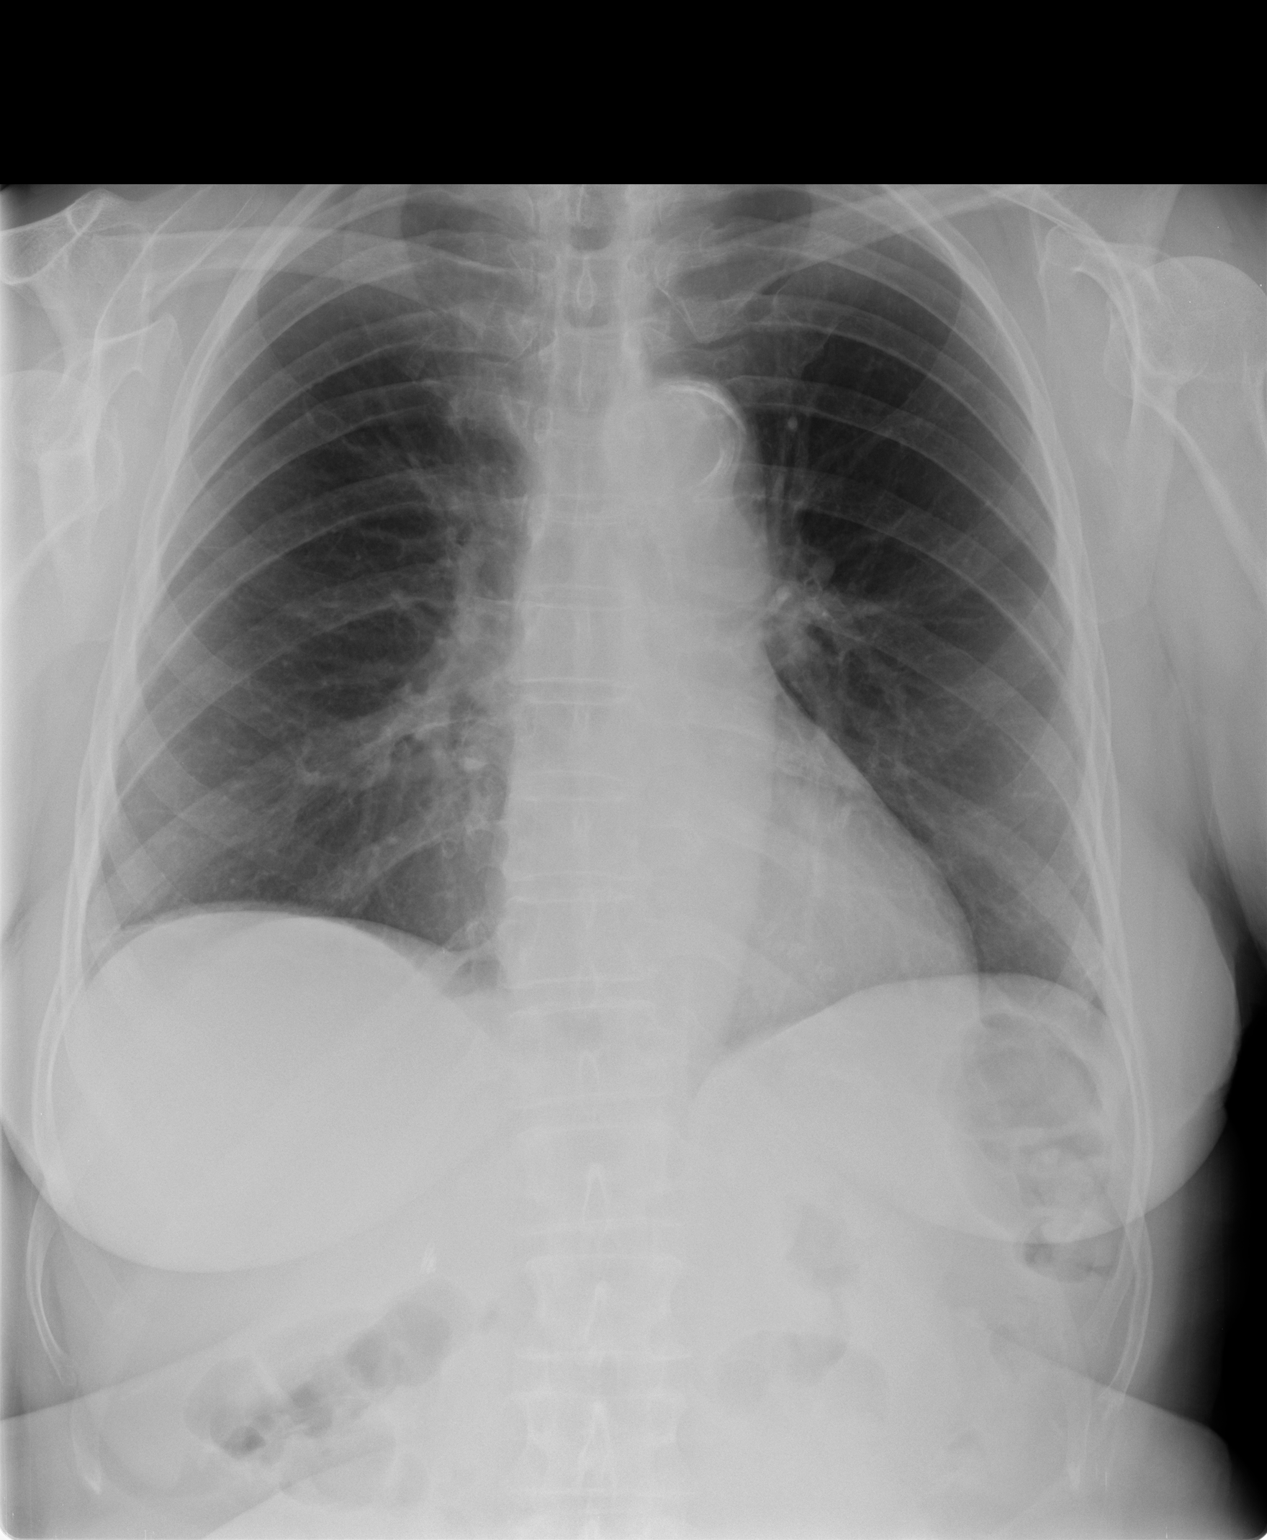

[view not recorded (2 of 2)]
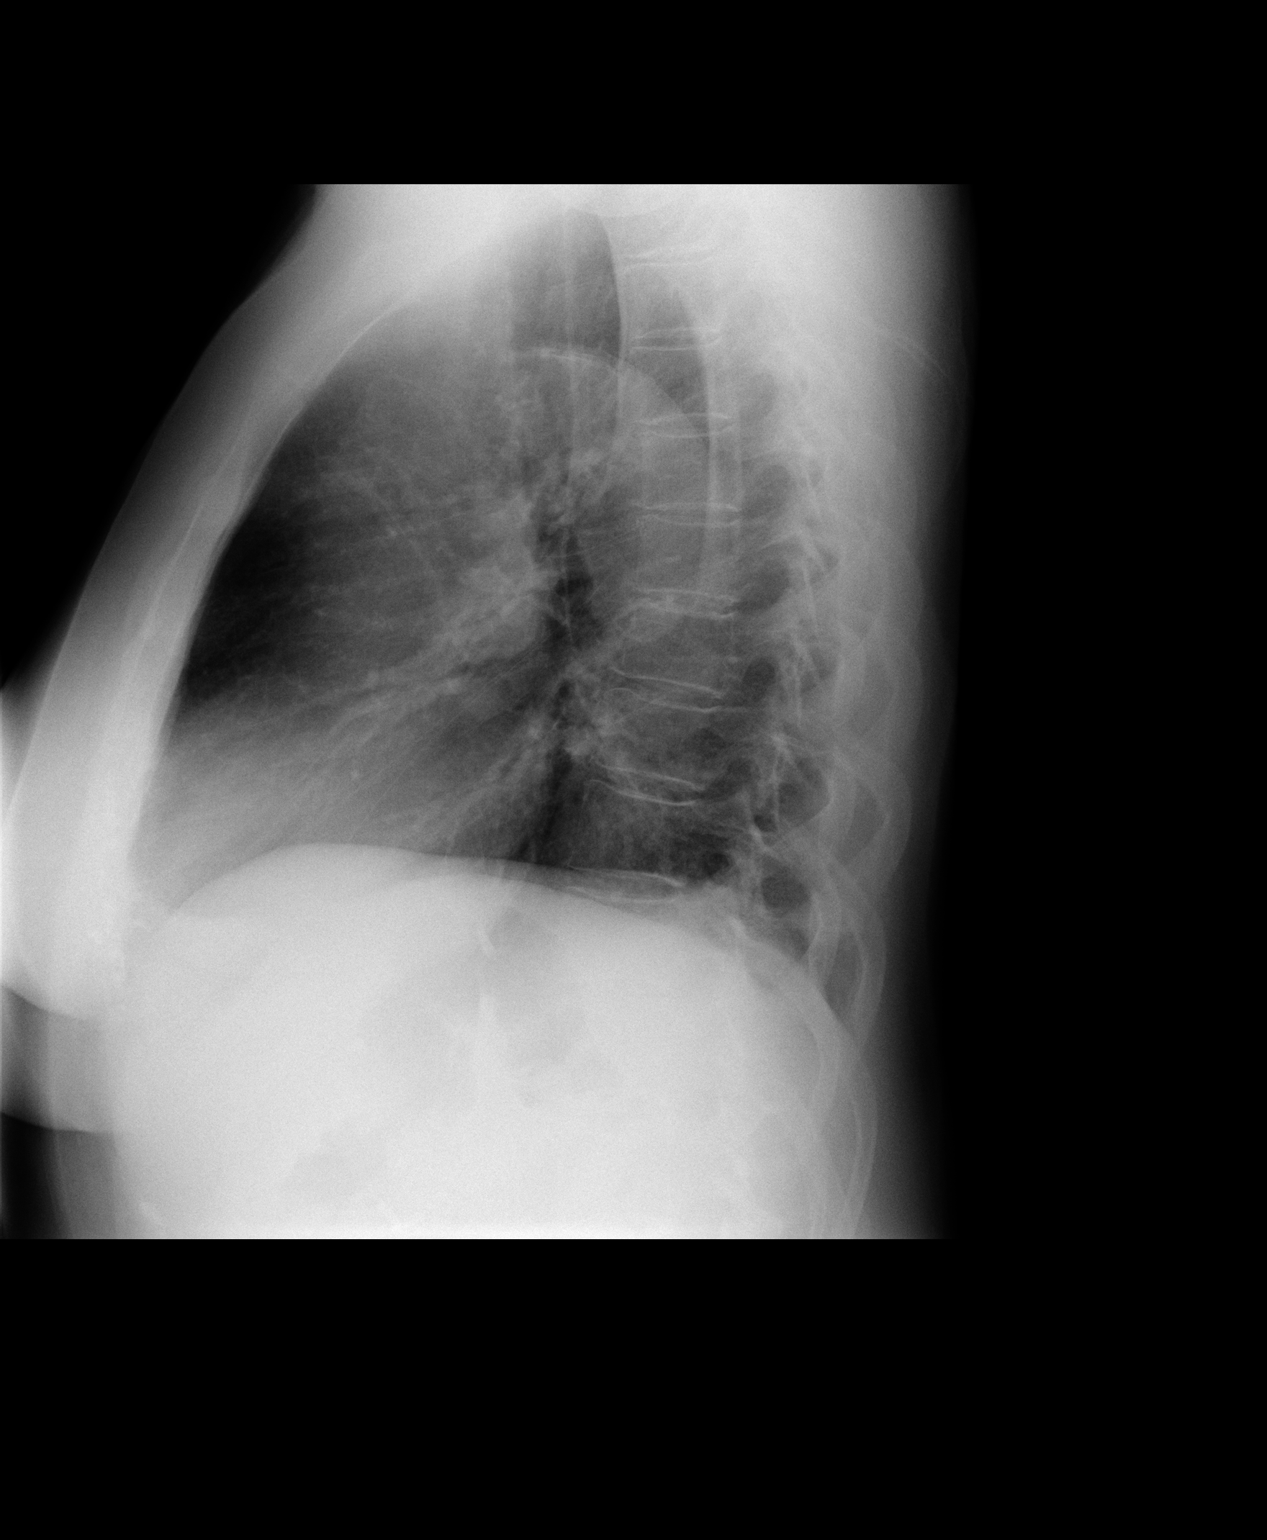

[2 of 2 positions shown; findings below may reference images not displayed]

FINDINGS: Heart size and vascular pattern normal. Calcified aortic arch. Lungs
clear. Bony thorax intact.
IMPRESSION: No active cardiopulmonary disease.

## 2015-12-02 DIAGNOSIS — E538 Deficiency of other specified B group vitamins: Secondary | ICD-10-CM | POA: Diagnosis not present

## 2015-12-02 DIAGNOSIS — Z6825 Body mass index (BMI) 25.0-25.9, adult: Secondary | ICD-10-CM | POA: Diagnosis not present

## 2015-12-02 DIAGNOSIS — E663 Overweight: Secondary | ICD-10-CM | POA: Diagnosis not present

## 2015-12-02 DIAGNOSIS — Z1389 Encounter for screening for other disorder: Secondary | ICD-10-CM | POA: Diagnosis not present

## 2015-12-02 DIAGNOSIS — E1165 Type 2 diabetes mellitus with hyperglycemia: Secondary | ICD-10-CM | POA: Diagnosis not present

## 2015-12-04 DIAGNOSIS — R112 Nausea with vomiting, unspecified: Secondary | ICD-10-CM | POA: Diagnosis not present

## 2015-12-04 DIAGNOSIS — R634 Abnormal weight loss: Secondary | ICD-10-CM | POA: Diagnosis not present

## 2015-12-04 DIAGNOSIS — D500 Iron deficiency anemia secondary to blood loss (chronic): Secondary | ICD-10-CM | POA: Diagnosis not present

## 2015-12-11 DIAGNOSIS — D631 Anemia in chronic kidney disease: Secondary | ICD-10-CM | POA: Diagnosis not present

## 2015-12-11 DIAGNOSIS — N2581 Secondary hyperparathyroidism of renal origin: Secondary | ICD-10-CM | POA: Diagnosis not present

## 2015-12-11 DIAGNOSIS — N182 Chronic kidney disease, stage 2 (mild): Secondary | ICD-10-CM | POA: Diagnosis not present

## 2015-12-16 DIAGNOSIS — N8111 Cystocele, midline: Secondary | ICD-10-CM | POA: Diagnosis not present

## 2015-12-16 DIAGNOSIS — N3020 Other chronic cystitis without hematuria: Secondary | ICD-10-CM | POA: Diagnosis not present

## 2015-12-16 DIAGNOSIS — N312 Flaccid neuropathic bladder, not elsewhere classified: Secondary | ICD-10-CM | POA: Diagnosis not present

## 2015-12-16 DIAGNOSIS — N816 Rectocele: Secondary | ICD-10-CM | POA: Diagnosis not present

## 2015-12-17 DIAGNOSIS — N182 Chronic kidney disease, stage 2 (mild): Secondary | ICD-10-CM | POA: Diagnosis not present

## 2015-12-17 DIAGNOSIS — D631 Anemia in chronic kidney disease: Secondary | ICD-10-CM | POA: Diagnosis not present

## 2015-12-17 DIAGNOSIS — N2581 Secondary hyperparathyroidism of renal origin: Secondary | ICD-10-CM | POA: Diagnosis not present

## 2015-12-17 DIAGNOSIS — I10 Essential (primary) hypertension: Secondary | ICD-10-CM | POA: Diagnosis not present

## 2015-12-22 ENCOUNTER — Other Ambulatory Visit: Payer: Self-pay | Admitting: Cardiology

## 2015-12-22 NOTE — Telephone Encounter (Signed)
Rx request sent to pharmacy.  

## 2016-01-06 DIAGNOSIS — N182 Chronic kidney disease, stage 2 (mild): Secondary | ICD-10-CM | POA: Diagnosis not present

## 2016-01-06 DIAGNOSIS — M84375A Stress fracture, left foot, initial encounter for fracture: Secondary | ICD-10-CM | POA: Diagnosis not present

## 2016-01-08 ENCOUNTER — Other Ambulatory Visit: Payer: Self-pay | Admitting: Nurse Practitioner

## 2016-01-21 DIAGNOSIS — M810 Age-related osteoporosis without current pathological fracture: Secondary | ICD-10-CM | POA: Diagnosis not present

## 2016-01-21 DIAGNOSIS — S93492A Sprain of other ligament of left ankle, initial encounter: Secondary | ICD-10-CM | POA: Diagnosis not present

## 2016-01-21 DIAGNOSIS — M84375D Stress fracture, left foot, subsequent encounter for fracture with routine healing: Secondary | ICD-10-CM | POA: Diagnosis not present

## 2016-01-22 ENCOUNTER — Encounter: Payer: Self-pay | Admitting: Nurse Practitioner

## 2016-01-22 ENCOUNTER — Ambulatory Visit (INDEPENDENT_AMBULATORY_CARE_PROVIDER_SITE_OTHER): Payer: Medicare Other | Admitting: Nurse Practitioner

## 2016-01-22 VITALS — BP 164/90 | HR 96 | Ht 66.0 in | Wt 163.0 lb

## 2016-01-22 DIAGNOSIS — G2581 Restless legs syndrome: Secondary | ICD-10-CM | POA: Diagnosis not present

## 2016-01-22 DIAGNOSIS — I10 Essential (primary) hypertension: Secondary | ICD-10-CM

## 2016-01-22 DIAGNOSIS — G3184 Mild cognitive impairment, so stated: Secondary | ICD-10-CM | POA: Diagnosis not present

## 2016-01-22 DIAGNOSIS — G629 Polyneuropathy, unspecified: Secondary | ICD-10-CM | POA: Diagnosis not present

## 2016-01-22 DIAGNOSIS — R269 Unspecified abnormalities of gait and mobility: Secondary | ICD-10-CM

## 2016-01-22 MED ORDER — PRAMIPEXOLE DIHYDROCHLORIDE 0.25 MG PO TABS: ORAL_TABLET | ORAL | 1 refills | Status: DC

## 2016-01-22 NOTE — Patient Instructions (Addendum)
Continue Cerefolin at current dose  Increase Mirapex to 1 tab (0.25mg ) at 4 pm and 2 tabs at bedtime for restless will refill Continue gabapentin at current dose, this is for peripheral neuropathy  Be careful with ambulation Memory score is stable  30/30.  Follow up in 6 months next with Leonie Man

## 2016-01-22 NOTE — Progress Notes (Signed)
I agree with the above plan 

## 2016-01-22 NOTE — Progress Notes (Signed)
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  Update 12/06/2012 (PS): [name] returns for f/u after last vist 06/07/2012. She has stopped cerefolin nac as insurance is not paying for it. Memory loss seems stable but she feels subjectively she was doing better on Cerefolin nac. She recently fell of a tractor in her yard and hurt her foot but is improving. She continues to have poor recent memory but is able to recall later. She decided not to do expedition 3 study upon advise from [name] her cardiologist.   Update 01/10/2014 ; she returns for followup after last visit 6 months ago who. She is scheduled in about the same. She continues to have mild short-term memory difficulties unchanged. She has been taking several daily and finds it quite useful. She saw primary physician Dr. Gerarda Fraction who suggested she stop the Depakote. 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 She used to also have some light sensitivity and possibly migrainous phenomena which also seemed to be controlled on Depakote. She underwent right shoulder surgery 2 weeks ago which went well and she has no complaints. She was hospitalized briefly twice for syncopal episodes and she thought to be vasovagal related to a diarrhea episode and dehydration. Update 07/12/2014 : She returns for follow-up after last visit 6 months ago. He is accompanied by her husband . She states some memory difficulties unchanged. She reads a lot particularly on religion. She has a difficult time remembering the day and the date but has not used good compensatory strategies for this. She remains on Cerefolin which she likes. She  has noted increasing symptoms of restless legs and at times has to take 2 tablets on Mirapex which work well. She remains on Depakote as she feels it helps her headaches and vision difficulties. She states her blood pressure is normally quite high but she has some whitecoat hypertension blood pressure is elevated today at 140/100. UPDATE 04/27/2017CM Ms. Koehne, 75 year old female returns for six-month follow-up. 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 She reports that she has good and bad days when she is not feeling her best she stays at home and keeps to herself. She continues to cook and drive and perform all activities of daily living. She reports that she has not gotten lost in familiar places. Her most recent hemoglobin A1c was 5.2 No further syncopal episodes since stopping Depakote . She remains on Cerefolin which she feels is beneficial for her neuropathy and her memory. She is also on Mirapex for restless legs.  She returns for reevaluation  UPDATE 01/22/16 CM Ms. Bouvier, 75 year old female returns for follow-up she has a history of mild cognitive impairment and memory score is stable today at 30/30. She remains on Cerefolin. She continues to cook and perform activities of daily living. She claims her blood sugars are in good control.She has restless leg syndrome which has gotten mildly worse in the left leg worse since she now has a stress fracture to her left foot and is in a boot. She had a fall at home. She has not had further syncopal episodes since stopping Depakote last year. She returns for reevaluation  REVIEW OF SYSTEMS: Full 14  system review of systems performed and notable only for those listed, all others are neg:  Constitutional: neg  Cardiovascular: Leg swelling Ear/Nose/Throat: neg  Skin: neg Eyes: neg Respiratory: neg Gastroitestinal: Urgency Hematology/Lymphatic: Easy bruising Endocrine: neg Musculoskeletal: Joint pain Allergy/Immunology:  neg Neurological: Weakness tremors Psychiatric: Anxiety Sleep : Restless legs   ALLERGIES: Allergies  Allergen Reactions  . Lipitor [Atorvastatin] Other (See Comments)    Bone and muscle pain  . Nsaids Swelling  . Reglan [Metoclopramide]     Tardive dyskinesia; paradoxical reaction not relieved by benadryl  . Tylenol [Acetaminophen] Other (See Comments)    "anything with tylenol in it" patient has Stage II Kidney Failure  . Ultram [Tramadol] Other (See Comments)    Pt has seizure activity with this medication    HOME MEDICATIONS: Outpatient Medications Prior to Visit  Medication Sig Dispense Refill  . Biotin w/ Vitamins C & E (HAIR SKIN & NAILS GUMMIES PO) Take 2 tablets by mouth every morning.    . bumetanide (BUMEX) 1 MG tablet Take 1.5 tablets (1.5 mg total) by mouth daily. Please schedule appointment for refills. 135 tablet 0  . Calcium Carb-Cholecalciferol (CALCIUM 600 + D PO) Take 1 tablet by mouth at bedtime.     . cholecalciferol (VITAMIN D) 1000 UNITS tablet Take 1,000 Units by mouth at bedtime.     . clopidogrel (PLAVIX) 75 MG tablet TAKE 1 TABLET DAILY (PLEASE SCHEDULE APPOINTMENT FOR REFILLS) (Patient taking differently: TAKE 1 TABLET (75 mg) DAILY (PLEASE SCHEDULE APPOINTMENT FOR REFILLS)) 90 tablet 2  . Cod Liver Oil 1000 MG CAPS Take 1,000 mg by mouth at bedtime.     Marland Kitchen CRANBERRY-VITAMIN C PO Take 1 capsule by mouth at bedtime.     Marland Kitchen estrogens, conjugated, (PREMARIN) 0.3 MG tablet Take 0.3 mg by mouth at bedtime.     . Fish Oil-Krill Oil (KRILL OIL PLUS PO) Take 900 mg by mouth daily.    Marland Kitchen gabapentin (NEURONTIN) 300 MG capsule Take 300 mg by mouth at bedtime.    Marland Kitchen L-Methylfolate-B12-B6-B2 (CEREFOLIN PO) Take 1 tablet by mouth at bedtime.     Marland Kitchen losartan-hydrochlorothiazide (HYZAAR) 100-12.5 MG tablet Take 0.5 tablets by mouth 2 (two) times daily. Taking by mouth daily one half tablet in the morning and one half tablet in the evening    . metFORMIN (GLUCOPHAGE) 500 MG  tablet Take 500 mg by mouth 2 (two) times daily with a meal.    . Methylfol-Methylcob-Acetylcyst (CEREFOLIN NAC) 6-2-600 MG TABS TAKE 1 TABLET BY MOUTH DAILY. 90 each 1  . metoprolol succinate (TOPROL XL) 50 MG 24 hr tablet Take 1/2 tablet by mouth daily. Please schedule appointment for refills. 45 tablet 0  . metoprolol succinate (TOPROL-XL) 25 MG 24 hr tablet Take 1 tablet (25 mg total) by mouth at bedtime.    . nitroGLYCERIN (NITROLINGUAL) 0.4 MG/SPRAY spray Place 1 spray under the tongue every 5 (five) minutes x 3 doses as needed for chest pain.     . Omega-3 Fatty Acids (FISH OIL) 1200 MG CAPS Take 1,200 mg by mouth at bedtime.     Marland Kitchen oxyCODONE (OXY IR/ROXICODONE) 5 MG immediate release tablet Take 1-2 tablets (5-10 mg total) by mouth every 6 (six) hours as needed for moderate pain, severe pain or breakthrough pain. 40 tablet 0  . pantoprazole (PROTONIX) 40 MG tablet Take 1 tablet (40 mg total) by mouth daily. PLEASE CONTACT OFFICE FOR ADDITIONAL REFILLS FINAL ATTEMPT 90 tablet 0  . pramipexole (MIRAPEX) 0.25 MG  tablet Take 2 tablets (0.5 mg total) by mouth at bedtime. 180 tablet 3  . Resveratrol (RESERVAPAK PO) Take 1 tablet by mouth every evening.     . rosuvastatin (CRESTOR) 20 MG tablet Take 20 mg by mouth at bedtime.     No facility-administered medications prior to visit.     PAST MEDICAL HISTORY: Past Medical History:  Diagnosis Date  . Anxiety   . Arthritis     osteoarthritis  . CAD S/P percutaneous coronary angioplasty 2007; March 2011   [facility] 3.0 mm 20 mm postdilated 3.6 mm in early mid LAD; status post ISR Cutting Balloon PTCA and March 11 along with PCI of distal mid lesion with a 3.0 mm 12 mm MultiLink vision BMS; the proximal stent causes jailing of SP1 and SP2 with ostial 70-80% lesions  . Cancer (HCC)    Melanoma, Squamous cell Carcinoma  . CHF (congestive heart failure) ([place])   . Chronic kidney disease   . Dementia    mild  . Diabetes mellitus type 2 with  neurological manifestations ([place])   . Dyslipidemia, goal LDL below 70   . Full dentures   . GERD (gastroesophageal reflux disease)   . Headache(784.0)   . History of hematuria    Followed by Dr. Gaynelle Arabian  . Hypertension, essential, benign   . Nausea & vomiting 10/2015  . Peripheral neuropathy ([place])   . Shortness of breath dyspnea    with exertion  . TIA (transient ischemic attack)    multiple in the past  . Wears glasses     PAST SURGICAL HISTORY: Past Surgical History:  Procedure Laterality Date  . ABDOMINAL HYSTERECTOMY    . APPENDECTOMY    . BLADDER SUSPENSION    . CARDIAC CATHETERIZATION  02/24/2006   85% stenosis in the proximal portion of LAD-arrangements made for PCI on 02/25/2006  . CARDIAC CATHETERIZATION  02/25/2006   80% LAD lesion stented with a 3x71mm Liberte stent resulting in reduction of 80% lesion to 0% residual  . CARDIAC CATHETERIZATION  04/26/2006   Medical management  . CARDIAC CATHETERIZATION  06/24/2009   60-70% re-stenosis in the proximal LAD. A 3.25x15 cutting balloon, 3 inflations - 14atm-38sec, 13atm-39sec, and 12atm-40sec reduced to less than 10%. 60% stenosis of the mid/distal LAD stented with a 3x79mm Multilink stent.  Marland Kitchen CARDIAC CATHETERIZATION  07/09/2009   Medical management  . CAROTID DOPPLER  06/24/2009   40-59% R ICA stenosis. No significant ICA stenosis noted  . CHOLECYSTECTOMY    . DOPPLER ECHOCARDIOGRAPHY  12/31/2009/July 2015   IsLV size & function, Gr 1 DD w/ mild TR.;; b)09/2013: 55-60%. Grade 1 DD, mild aortic sclerosis  . JOINT REPLACEMENT Left   . LESION REMOVAL Right 03/11/2015   Procedure:  EXCISION OF RIGHT PRE TIBIAL LESION;  Surgeon: Judeth Horn, MD;  Location: [facility];  Service: General;  Laterality: Right;  . MASS EXCISION Left 06/10/2014   Procedure: EXCISION LEFT LOWER LEG LESION;  Surgeon: Doreen Salvage, MD;  Location: [facility];  Service: General;  Laterality: Left;  Marland Kitchen MASS EXCISION Left  09/05/2015   Procedure: EXCISION OF LEFT FOREARM  MASS;  Surgeon: Judeth Horn, MD;  Location: [facility];  Service: General;  Laterality: Left;  . NM MYOVIEW LTD  11/16/2011   5 beats a PVC during recovery.  No skin or infarction.  Reached 5 METs, EKG negative for ischemia   . SHOULDER ARTHROSCOPY  10/15  . SHOULDER ARTHROSCOPY  1995  left  . TOTAL KNEE ARTHROPLASTY  2005   left  . WRIST ARTHROPLASTY  2010   cancer lt wrist    FAMILY HISTORY: Family History  Problem Relation Age of Onset  . Diabetes Mother   . Hypertension Mother   . Kidney disease Mother   . Hypertension Father   . Emphysema Father   . Heart attack Father   . Heart disease Father   . Arthritis Sister   . Diabetes Sister   . Hypertension Sister   . Hypertension Brother   . Hyperlipidemia Brother   . Diabetes Brother   . Stroke Brother   . Diabetes Sister   . Hypertension Sister   . Cancer Sister     Cervical cancer  . Hypertension Brother   . Heart attack Daughter   . Hypertension Daughter   . COPD Daughter   . Cancer Daughter     Breast cancer  . Kidney disease Daughter     Kidney mass    SOCIAL HISTORY: Social History   Social History  . Marital status: Married    Spouse name: [name]  . Number of children: 3  . Years of education: 2   Occupational History  . retired Retired   Social History Main Topics  . Smoking status: Never Smoker  . Smokeless tobacco: Never Used  . Alcohol use No  . Drug use: No  . Sexual activity: No   Other Topics Concern  . Not on file   Social History Narrative   She is a married mother of 39, grandmother of 2.  Usually very active and out about the house.  But currently over last 6-8 weeks has been incapacitated.  She never smoked and does not drink alcohol.     PHYSICAL EXAM  Vitals:   01/22/16 0858  Weight: 163 lb (73.9 kg)  Height: 5\' 6"  (1.676 m)   Body mass index is 26.31 kg/m. General: Frail elderly Caucasian seated, in no  evident distress  Head: head normocephalic and atraumatic. Orohparynx benign  Neck: supple with no carotid  bruits  Cardiovascular: regular rate and rhythm, no murmurs  Musculoskeletal: no deformity Boot to left lower extremity due to stress fracture Skin: no rash/petichiae   Neurologic Exam  Mental Status: Awake and fully alert. Oriented to place and time. Recent and remote memory intact. Attention span, concentration and fund of knowledge appropriate. Mood and affect appropriate. MMSE 30, AFT 12, CDT 4/4,  Cranial Nerves: Pupils equal, briskly reactive to light. Extraocular movements full without nystagmus. Visual fields full to confrontation. Hearing intact. Facial sensation intact. Face, tongue, palate moves normally and symmetrically.  Motor: Normal bulk and tone. Normal strength in all tested extremity muscles. Mild head tremor at rest.  Sensory: intact to touch , decreased pinprick to midshin decreased vibratory to ankles.  Coordination: Rapid alternating movements normal in all extremities. Finger-to-nose and heel-to-shin performed accurately bilaterally.  Gait and Station: Arises from chair without difficulty. Stance is wide based.. Gait demonstrates normal stride length and balance . Unable to heel, toe or  tandem walk .No assistive device left lower extremity boot in place for stress fracture Reflexes: 1+ and symmetric.  DIAGNOSTIC DATA (LABS, IMAGING, TESTING) - I reviewed patient records, labs, notes, testing and imaging myself where available.  Lab Results  Component Value Date   WBC 7.4 11/18/2015   HGB 9.6 (L) 11/18/2015   HCT 30.5 (L) 11/18/2015   MCV 87.1 11/18/2015   PLT 204 11/18/2015  Component Value Date/Time   NA 139 11/18/2015 0323   K 4.0 11/18/2015 0323   CL 110 11/18/2015 0323   CO2 22 11/18/2015 0323   GLUCOSE 105 (H) 11/18/2015 0323   BUN 11 11/18/2015 0323   CREATININE 1.25 (H) 11/18/2015 0323   CREATININE 0.94 01/09/2013 0839    CALCIUM 8.0 (L) 11/18/2015 0323   PROT 7.1 11/16/2015 1927   ALBUMIN 3.7 11/16/2015 1927   AST 22 11/16/2015 1927   ALT 12 (L) 11/16/2015 1927   ALKPHOS 80 11/16/2015 1927   BILITOT 0.7 11/16/2015 1927   GFRNONAA 40 (L) 11/18/2015 0323   GFRAA 23 (L) 11/18/2015 0323     ASSESSMENT AND PLAN 75 y.o. 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 Likely restless leg syndrome, Peripheral neuropathy (HCC)  Chronic kidney disease; Headache(784.0); gait abnormality here to follow up.   PLAN: Continue Cerefolin at current dose  Increase Mirapex to 1 tab (0.25mg ) at 4 pm and 2 tabs at bedtime for restless will refill Continue gabapentin at current dose, this is for peripheral neuropathy  Be careful with ambulation Memory score is stable  30/30.  Follow up in 6 months next with Sloan Leiter, [facility], [facility], [facility] [address] [place], [place] [address] [phone]

## 2016-01-23 ENCOUNTER — Ambulatory Visit: Payer: Self-pay | Admitting: Nurse Practitioner

## 2016-01-26 DIAGNOSIS — M8589 Other specified disorders of bone density and structure, multiple sites: Secondary | ICD-10-CM | POA: Diagnosis not present

## 2016-02-01 ENCOUNTER — Other Ambulatory Visit: Payer: Self-pay | Admitting: Cardiology

## 2016-02-13 DIAGNOSIS — M810 Age-related osteoporosis without current pathological fracture: Secondary | ICD-10-CM | POA: Diagnosis not present

## 2016-02-13 DIAGNOSIS — M84375D Stress fracture, left foot, subsequent encounter for fracture with routine healing: Secondary | ICD-10-CM | POA: Diagnosis not present

## 2016-02-13 DIAGNOSIS — E559 Vitamin D deficiency, unspecified: Secondary | ICD-10-CM | POA: Diagnosis not present

## 2016-02-13 DIAGNOSIS — R5383 Other fatigue: Secondary | ICD-10-CM | POA: Diagnosis not present

## 2016-02-20 DIAGNOSIS — J040 Acute laryngitis: Secondary | ICD-10-CM | POA: Diagnosis not present

## 2016-02-20 DIAGNOSIS — J0100 Acute maxillary sinusitis, unspecified: Secondary | ICD-10-CM | POA: Diagnosis not present

## 2016-02-20 DIAGNOSIS — J209 Acute bronchitis, unspecified: Secondary | ICD-10-CM | POA: Diagnosis not present

## 2016-02-23 DIAGNOSIS — M79672 Pain in left foot: Secondary | ICD-10-CM | POA: Diagnosis not present

## 2016-02-26 DIAGNOSIS — S86212A Strain of muscle(s) and tendon(s) of anterior muscle group at lower leg level, left leg, initial encounter: Secondary | ICD-10-CM | POA: Diagnosis not present

## 2016-02-26 DIAGNOSIS — Z23 Encounter for immunization: Secondary | ICD-10-CM | POA: Diagnosis not present

## 2016-03-08 DIAGNOSIS — M25572 Pain in left ankle and joints of left foot: Secondary | ICD-10-CM | POA: Diagnosis not present

## 2016-03-08 DIAGNOSIS — G8929 Other chronic pain: Secondary | ICD-10-CM | POA: Diagnosis not present

## 2016-03-08 DIAGNOSIS — S86219A Strain of muscle(s) and tendon(s) of anterior muscle group at lower leg level, unspecified leg, initial encounter: Secondary | ICD-10-CM | POA: Diagnosis not present

## 2016-03-11 DIAGNOSIS — E1165 Type 2 diabetes mellitus with hyperglycemia: Secondary | ICD-10-CM | POA: Diagnosis not present

## 2016-03-11 DIAGNOSIS — E7800 Pure hypercholesterolemia, unspecified: Secondary | ICD-10-CM | POA: Diagnosis not present

## 2016-03-18 DIAGNOSIS — I10 Essential (primary) hypertension: Secondary | ICD-10-CM | POA: Diagnosis not present

## 2016-03-18 DIAGNOSIS — E1165 Type 2 diabetes mellitus with hyperglycemia: Secondary | ICD-10-CM | POA: Diagnosis not present

## 2016-03-18 DIAGNOSIS — G609 Hereditary and idiopathic neuropathy, unspecified: Secondary | ICD-10-CM | POA: Diagnosis not present

## 2016-03-18 DIAGNOSIS — E7800 Pure hypercholesterolemia, unspecified: Secondary | ICD-10-CM | POA: Diagnosis not present

## 2016-03-21 ENCOUNTER — Other Ambulatory Visit: Payer: Self-pay | Admitting: Cardiology

## 2016-03-23 NOTE — Telephone Encounter (Signed)
REFILL 

## 2016-03-29 HISTORY — PX: BREAST LUMPECTOMY: SHX2

## 2016-04-05 DIAGNOSIS — N182 Chronic kidney disease, stage 2 (mild): Secondary | ICD-10-CM | POA: Diagnosis not present

## 2016-04-05 DIAGNOSIS — N2581 Secondary hyperparathyroidism of renal origin: Secondary | ICD-10-CM | POA: Diagnosis not present

## 2016-04-05 DIAGNOSIS — D631 Anemia in chronic kidney disease: Secondary | ICD-10-CM | POA: Diagnosis not present

## 2016-04-11 IMAGING — MG MM SCREEN MAMMOGRAM BILATERAL
4 series · 4 of 4 positions shown · non-contrast
Comparison: Previous exam(s).

CLINICAL DATA: Screening.

EXAM:
DIGITAL SCREENING BILATERAL MAMMOGRAM WITH CAD

[R CC]
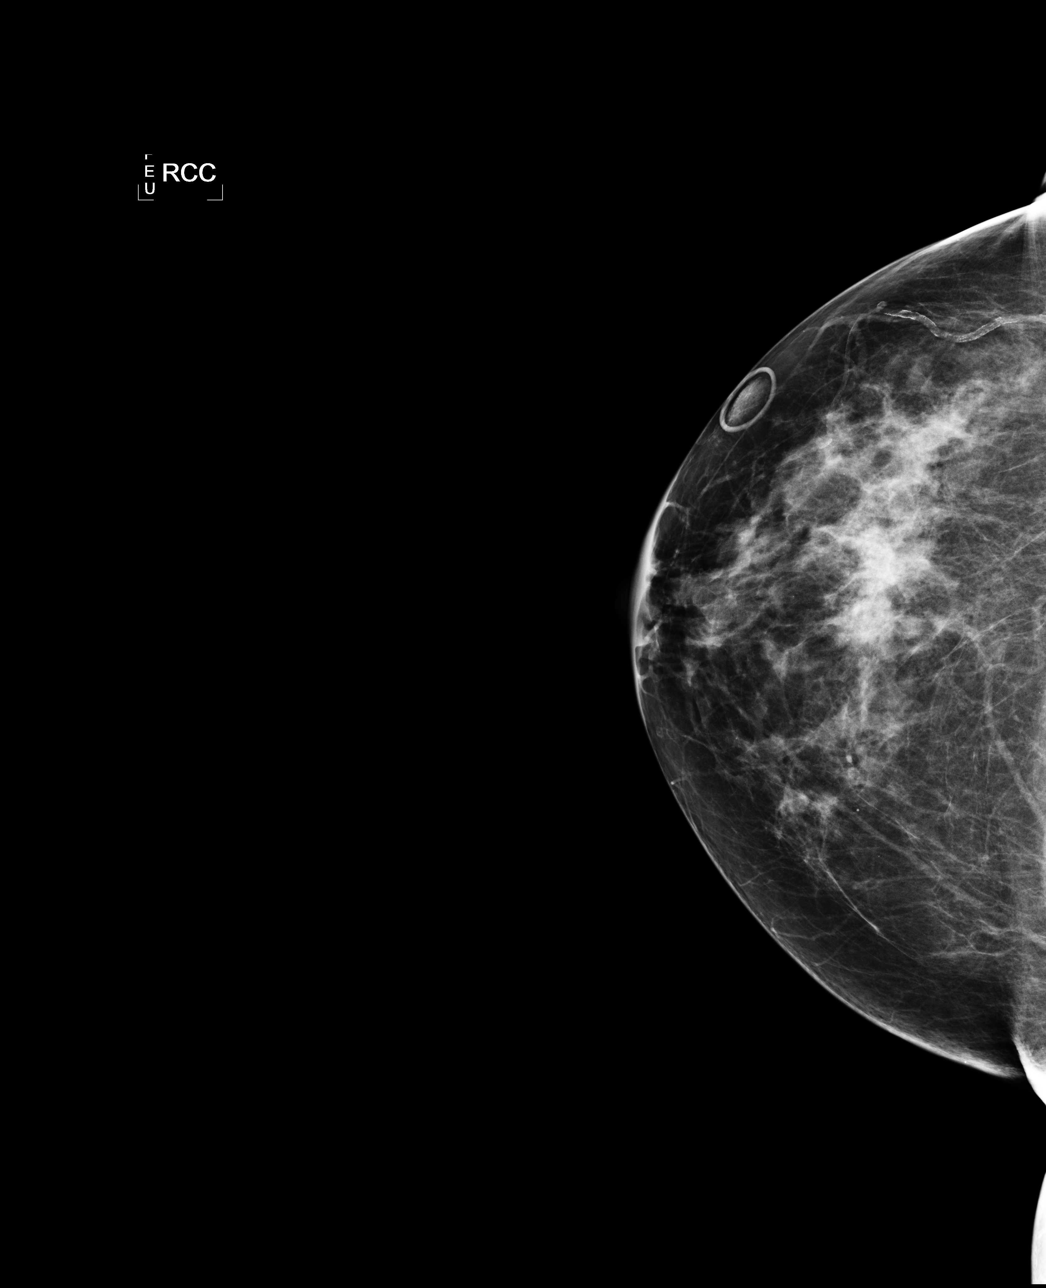

[L CC]
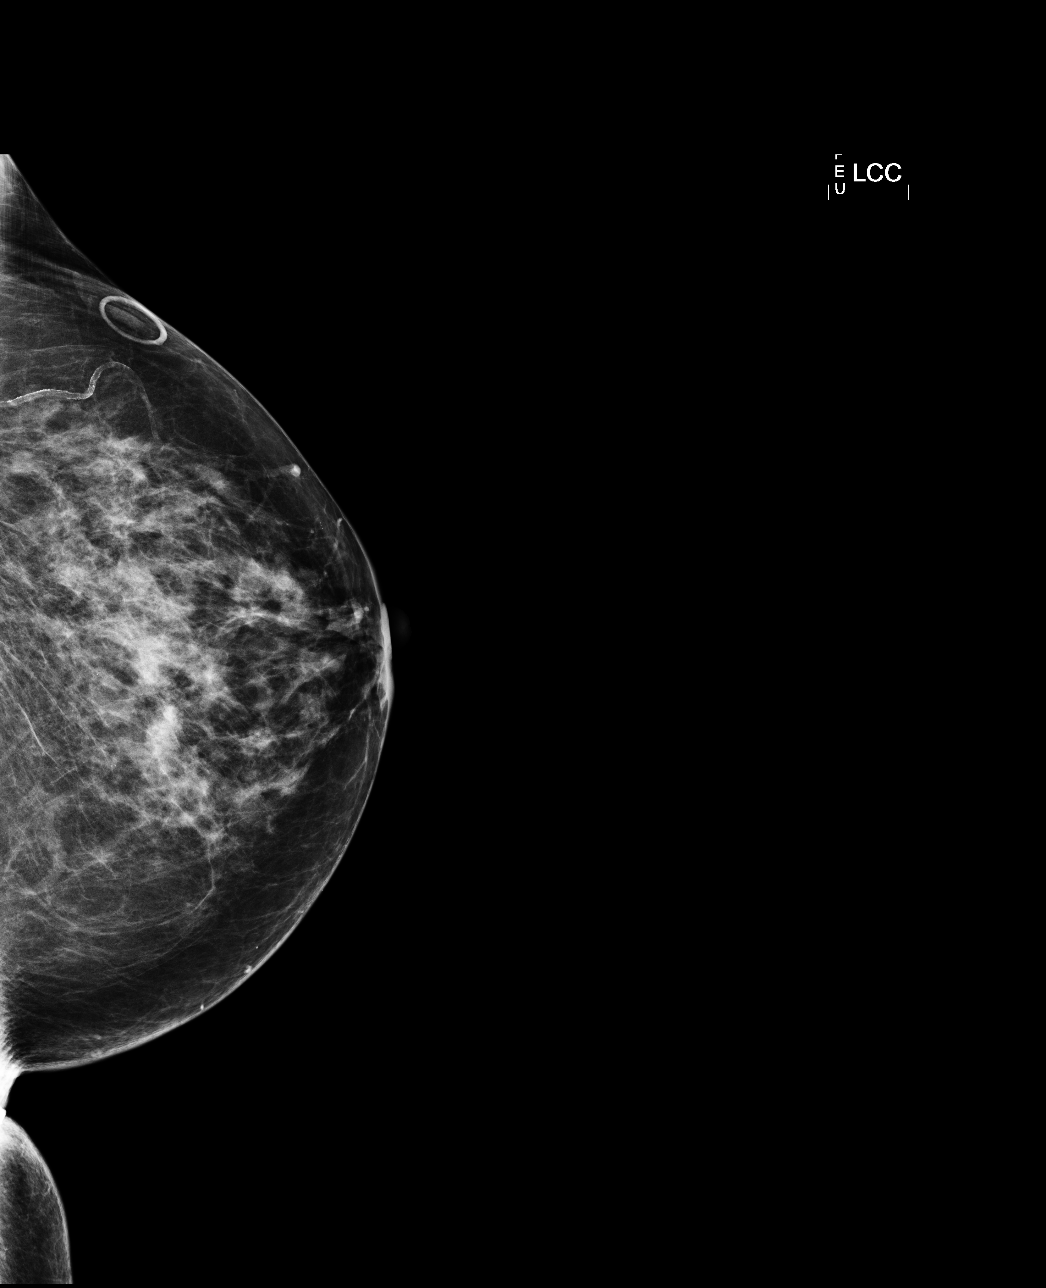

[L MLO]
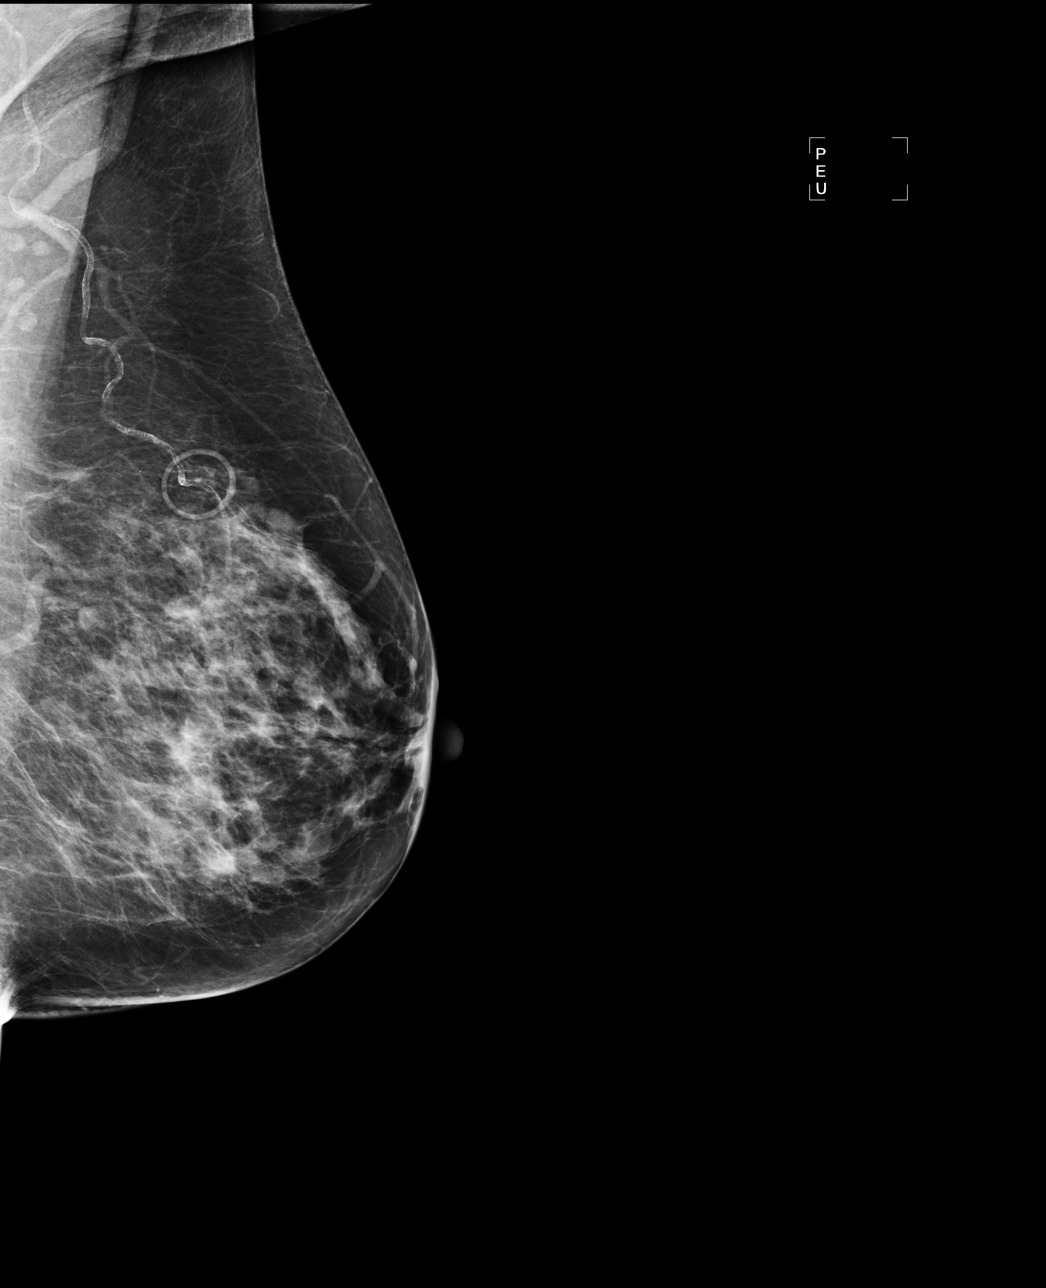

[R MLO]
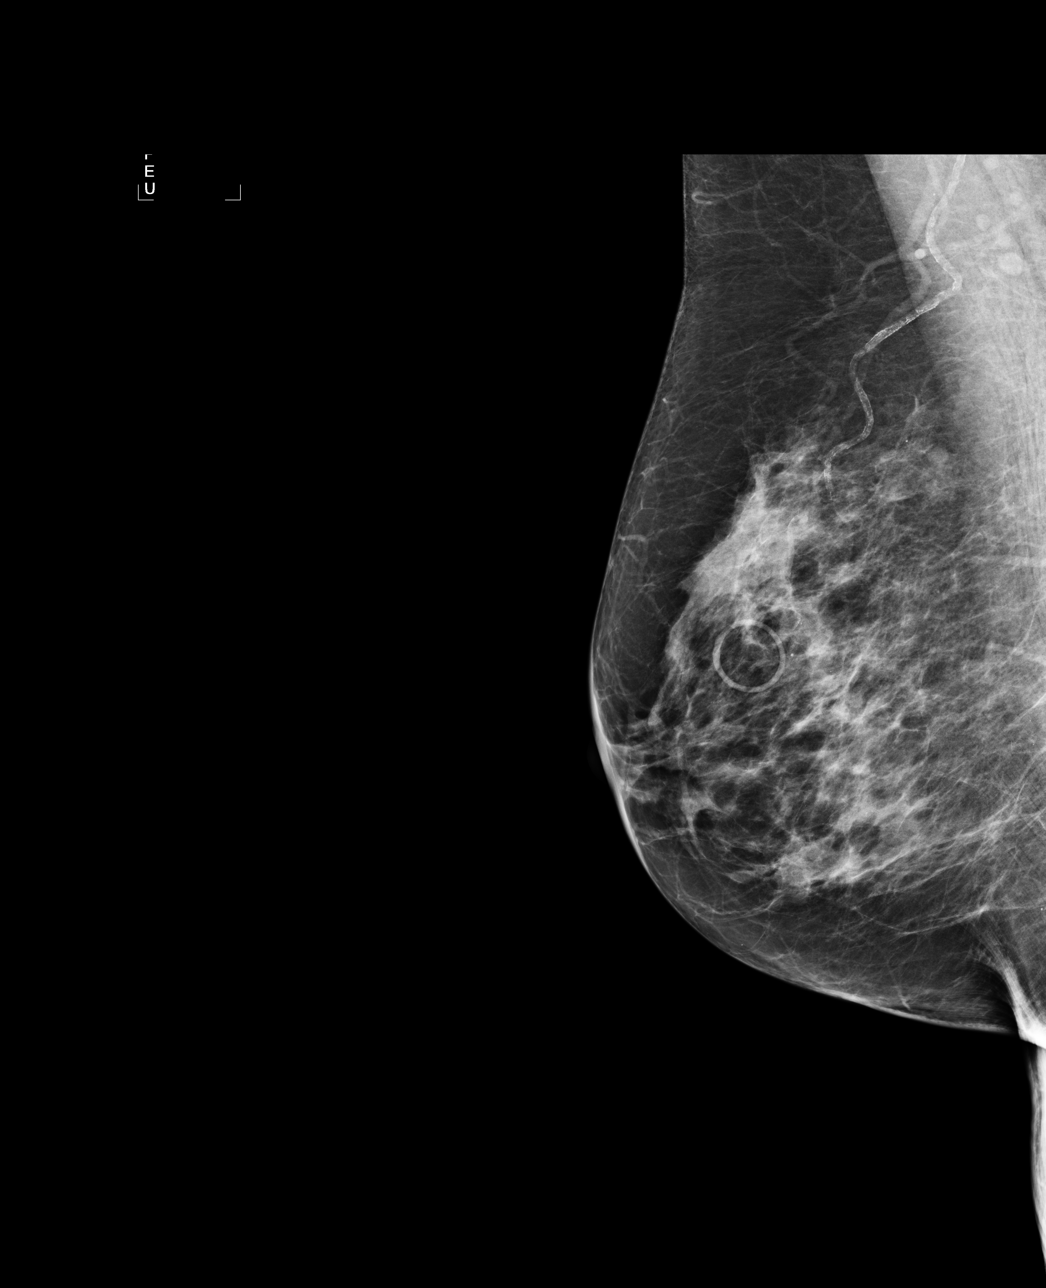

[4 of 4 positions shown; findings below may reference images not displayed]

ACR Breast Density Category c: The breast tissue is heterogeneously
dense, which may obscure small masses.
FINDINGS: There has been no significant interval change and there are no
findings suspicious for malignancy. Images were processed with CAD.
IMPRESSION: No mammographic evidence of malignancy. A result letter of this
screening mammogram will be mailed directly to the patient.

RECOMMENDATION:
Screening mammogram in one year. (Code:CK-A-TS5)

BI-RADS CATEGORY  1: Negative.

## 2016-04-19 ENCOUNTER — Ambulatory Visit (INDEPENDENT_AMBULATORY_CARE_PROVIDER_SITE_OTHER): Payer: Self-pay | Admitting: Orthopedic Surgery

## 2016-04-19 DIAGNOSIS — N2581 Secondary hyperparathyroidism of renal origin: Secondary | ICD-10-CM | POA: Diagnosis not present

## 2016-04-19 DIAGNOSIS — D631 Anemia in chronic kidney disease: Secondary | ICD-10-CM | POA: Diagnosis not present

## 2016-04-19 DIAGNOSIS — N183 Chronic kidney disease, stage 3 (moderate): Secondary | ICD-10-CM | POA: Diagnosis not present

## 2016-04-19 DIAGNOSIS — I10 Essential (primary) hypertension: Secondary | ICD-10-CM | POA: Diagnosis not present

## 2016-04-20 ENCOUNTER — Ambulatory Visit (INDEPENDENT_AMBULATORY_CARE_PROVIDER_SITE_OTHER): Payer: Medicare Other | Admitting: Orthopedic Surgery

## 2016-04-20 ENCOUNTER — Ambulatory Visit (INDEPENDENT_AMBULATORY_CARE_PROVIDER_SITE_OTHER): Payer: Medicare Other

## 2016-04-20 VITALS — Ht 66.0 in | Wt 163.0 lb

## 2016-04-20 DIAGNOSIS — M79672 Pain in left foot: Secondary | ICD-10-CM

## 2016-04-20 DIAGNOSIS — S86212A Strain of muscle(s) and tendon(s) of anterior muscle group at lower leg level, left leg, initial encounter: Secondary | ICD-10-CM | POA: Diagnosis not present

## 2016-04-20 NOTE — Progress Notes (Signed)
Office Visit Note   Patient: [name]           Date of Birth: 1940/07/26           MRN: [id number] Visit Date: 04/20/2016              Requested by: Redmond School, MD [address] [place], [place] [address] PCP: Glo Herring., MD  Chief Complaint  Patient presents with  . Left Foot - Pain    HPI: Pt states that she had an acute onset of left foot pain back in October. Pt states that she was not able to walk on her foot and after xrays and an MRI she was found to have a stress fracture and a torn tendon in her foot. Patient states that Dr. Doran Durand that is the one that evaluated her did not want to do any type of surgical correction because she is diabetic and wanted instead to have her have a brace made for her shoe. The patient states that " I am not ready to sit down and wear a brace for the rest of my life" the patient is seeking a second opinion and to discuss surgical corection in the office today. Autumn L Forrest, RMA  Patient states she cannot go through life wearing a brace. Patient ALSO complains of cramping in her legs she does take a fluid pill she states her hemoglobin A1c is 5.6 under good control.  Assessment & Plan: Visit Diagnoses:  1. Traumatic rupture of left anterior tibial tendon, initial encounter   2. Pain in left foot     Plan: Discussed the treatment options would be to use a double upright brace versus surgical reconstruction of the anterior tibial tendon. Discussed that with her diabetes she is an increased risk of infection increased risk of the wound not healing increased risk of the tendon re-rupturing and potential for a transtibial amputation. Patient states that she cannot go through life in a brace and cannot go through life with a foot drop and wishes to proceed with surgical intervention for reconstruction of the anterior tibial tendon on the left. Recommended coconut water to see if this would resolve some of her cramping.  Follow-Up  Instructions: Return in about 2 weeks (around 05/04/2016).   Ortho Exam Examination patient is alert oriented no adenopathy well-dressed normal affect normal respiratory effort she does have a foot drop on the left with ambulation. She states she has foot drop at home. She has a palpable defect of the anterior tibial tendon. Her MRI scan is reviewed which also shows a rupture of the anterior tibial tendon. She has no plantar ulcers no venous stasis ulcers she has a palpable dorsalis pedis pulse.  Imaging: Xr Foot Complete Left  Result Date: 04/20/2016 Three-view radiographs of the left foot shows no bony abnormalities of medication of the digital vessels. There is a little bit of calcification over the talar neck which may be part of an avulsion from the anterior tibial tendon.   Orders:  Orders Placed This Encounter  Procedures  . XR Foot Complete Left   No orders of the defined types were placed in this encounter.    Procedures: No procedures performed  Clinical Data: No additional findings.  Subjective: Review of Systems  Objective: Vital Signs: Ht 5\' 6"  (1.676 m)   Wt 163 lb (73.9 kg)   BMI 26.31 kg/m   Specialty Comments:  No specialty comments available.  PMFS History: Patient Active Problem List  Diagnosis Date Noted  . Traumatic rupture of left anterior tibial tendon 04/20/2016  . Diarrhea   . Acute on chronic kidney failure ([place])   . 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 ([place])   . Headache, migraine   . Unexplained weight loss 11/17/2015  . Sepsis, unspecified organism ([place]) 11/17/2015  . Hyponatremia 11/17/2015  . Hypokalemia 11/17/2015  . Acute kidney injury superimposed on chronic kidney disease ([place]) 11/17/2015  . Restless legs syndrome 07/24/2015  . Mild cognitive impairment 01/08/2015  . Abnormality of gait 01/08/2015  . Dizziness 12/19/2014  . Preoperative cardiovascular examination 12/11/2013  . GERD (gastroesophageal reflux  disease) 10/06/2013  . Nausea vomiting and diarrhea 10/06/2013  . Syncope 10/05/2013  . Dementia 12/06/2012  . Chest pain at rest 10/28/2012  . Anxiety 10/28/2012    Class: Acute  . Edema of both legs 10/28/2012  . CAD S/P percutaneous coronary angioplasty   . Essential hypertension   . Dyslipidemia, goal LDL below 70    Past Medical History:  Diagnosis Date  . Anxiety   . Arthritis     osteoarthritis  . CAD S/P percutaneous coronary angioplasty 2007; March 2011   [facility] 3.0 mm 20 mm postdilated 3.6 mm in early mid LAD; status post ISR Cutting Balloon PTCA and March 11 along with PCI of distal mid lesion with a 3.0 mm 12 mm MultiLink vision BMS; the proximal stent causes jailing of SP1 and SP2 with ostial 70-80% lesions  . Cancer (HCC)    Melanoma, Squamous cell Carcinoma  . CHF (congestive heart failure) ([place])   . Chronic kidney disease   . Dementia    mild  . Diabetes mellitus type 2 with neurological manifestations ([place])   . Dyslipidemia, goal LDL below 70   . Full dentures   . GERD (gastroesophageal reflux disease)   . Headache(784.0)   . History of hematuria    Followed by Dr. Gaynelle Arabian  . Hypertension, essential, benign   . Nausea & vomiting 10/2015  . Peripheral neuropathy ([place])   . Shortness of breath dyspnea    with exertion  . TIA (transient ischemic attack)    multiple in the past  . Wears glasses     Family History  Problem Relation Age of Onset  . Diabetes Mother   . Hypertension Mother   . Kidney disease Mother   . Hypertension Father   . Emphysema Father   . Heart attack Father   . Heart disease Father   . Arthritis Sister   . Diabetes Sister   . Hypertension Sister   . Hypertension Brother   . Hyperlipidemia Brother   . Diabetes Brother   . Stroke Brother   . Diabetes Sister   . Hypertension Sister   . Cancer Sister     Cervical cancer  . Hypertension Brother   . Heart attack Daughter   . Hypertension Daughter   . COPD Daughter     . Cancer Daughter     Breast cancer  . Kidney disease Daughter     Kidney mass    Past Surgical History:  Procedure Laterality Date  . ABDOMINAL HYSTERECTOMY    . APPENDECTOMY    . BLADDER SUSPENSION    . CARDIAC CATHETERIZATION  02/24/2006   85% stenosis in the proximal portion of LAD-arrangements made for PCI on 02/25/2006  . CARDIAC CATHETERIZATION  02/25/2006   80% LAD lesion stented with a 3x31mm Liberte stent resulting in reduction of 80% lesion to 0%  residual  . CARDIAC CATHETERIZATION  04/26/2006   Medical management  . CARDIAC CATHETERIZATION  06/24/2009   60-70% re-stenosis in the proximal LAD. A 3.25x15 cutting balloon, 3 inflations - 14atm-38sec, 13atm-39sec, and 12atm-40sec reduced to less than 10%. 60% stenosis of the mid/distal LAD stented with a 3x51mm Multilink stent.  Marland Kitchen CARDIAC CATHETERIZATION  07/09/2009   Medical management  . CAROTID DOPPLER  06/24/2009   40-59% R ICA stenosis. No significant ICA stenosis noted  . CHOLECYSTECTOMY    . DOPPLER ECHOCARDIOGRAPHY  12/31/2009/July 2015   IsLV size & function, Gr 1 DD w/ mild TR.;; b)09/2013: 55-60%. Grade 1 DD, mild aortic sclerosis  . JOINT REPLACEMENT Left   . LESION REMOVAL Right 03/11/2015   Procedure:  EXCISION OF RIGHT PRE TIBIAL LESION;  Surgeon: Judeth Horn, MD;  Location: [facility];  Service: General;  Laterality: Right;  . MASS EXCISION Left 06/10/2014   Procedure: EXCISION LEFT LOWER LEG LESION;  Surgeon: Doreen Salvage, MD;  Location: [facility];  Service: General;  Laterality: Left;  Marland Kitchen MASS EXCISION Left 09/05/2015   Procedure: EXCISION OF LEFT FOREARM  MASS;  Surgeon: Judeth Horn, MD;  Location: [facility];  Service: General;  Laterality: Left;  . NM MYOVIEW LTD  11/16/2011   5 beats a PVC during recovery.  No skin or infarction.  Reached 5 METs, EKG negative for ischemia   . SHOULDER ARTHROSCOPY  10/15  . SHOULDER ARTHROSCOPY  1995   left  . TOTAL KNEE ARTHROPLASTY   2005   left  . WRIST ARTHROPLASTY  2010   cancer lt wrist   Social History   Occupational History  . retired Retired   Social History Main Topics  . Smoking status: Never Smoker  . Smokeless tobacco: Never Used  . Alcohol use No  . Drug use: No  . Sexual activity: No

## 2016-04-21 ENCOUNTER — Other Ambulatory Visit (INDEPENDENT_AMBULATORY_CARE_PROVIDER_SITE_OTHER): Payer: Self-pay | Admitting: Family

## 2016-05-01 ENCOUNTER — Other Ambulatory Visit: Payer: Self-pay | Admitting: Cardiology

## 2016-05-06 ENCOUNTER — Encounter (HOSPITAL_COMMUNITY)
Admission: RE | Admit: 2016-05-06 | Discharge: 2016-05-06 | Disposition: A | Discharge status: Home / Self Care | Payer: Medicare Other | Source: Ambulatory Visit | Attending: Orthopedic Surgery | Admitting: Orthopedic Surgery

## 2016-05-06 ENCOUNTER — Encounter (HOSPITAL_COMMUNITY): Payer: Self-pay

## 2016-05-06 DIAGNOSIS — Z955 Presence of coronary angioplasty implant and graft: Secondary | ICD-10-CM | POA: Insufficient documentation

## 2016-05-06 DIAGNOSIS — I2510 Atherosclerotic heart disease of native coronary artery without angina pectoris: Secondary | ICD-10-CM | POA: Insufficient documentation

## 2016-05-06 DIAGNOSIS — G3184 Mild cognitive impairment, so stated: Secondary | ICD-10-CM | POA: Insufficient documentation

## 2016-05-06 DIAGNOSIS — Z01812 Encounter for preprocedural laboratory examination: Secondary | ICD-10-CM | POA: Insufficient documentation

## 2016-05-06 DIAGNOSIS — Z85820 Personal history of malignant melanoma of skin: Secondary | ICD-10-CM | POA: Insufficient documentation

## 2016-05-06 DIAGNOSIS — S86212A Strain of muscle(s) and tendon(s) of anterior muscle group at lower leg level, left leg, initial encounter: Secondary | ICD-10-CM | POA: Insufficient documentation

## 2016-05-06 DIAGNOSIS — X58XXXA Exposure to other specified factors, initial encounter: Secondary | ICD-10-CM | POA: Insufficient documentation

## 2016-05-06 DIAGNOSIS — Z79899 Other long term (current) drug therapy: Secondary | ICD-10-CM | POA: Insufficient documentation

## 2016-05-06 DIAGNOSIS — I509 Heart failure, unspecified: Secondary | ICD-10-CM | POA: Diagnosis not present

## 2016-05-06 DIAGNOSIS — Z7902 Long term (current) use of antithrombotics/antiplatelets: Secondary | ICD-10-CM | POA: Insufficient documentation

## 2016-05-06 DIAGNOSIS — E785 Hyperlipidemia, unspecified: Secondary | ICD-10-CM | POA: Insufficient documentation

## 2016-05-06 DIAGNOSIS — Z7984 Long term (current) use of oral hypoglycemic drugs: Secondary | ICD-10-CM | POA: Insufficient documentation

## 2016-05-06 DIAGNOSIS — Z8673 Personal history of transient ischemic attack (TIA), and cerebral infarction without residual deficits: Secondary | ICD-10-CM | POA: Insufficient documentation

## 2016-05-06 DIAGNOSIS — I110 Hypertensive heart disease with heart failure: Secondary | ICD-10-CM | POA: Insufficient documentation

## 2016-05-06 DIAGNOSIS — Z01818 Encounter for other preprocedural examination: Secondary | ICD-10-CM | POA: Insufficient documentation

## 2016-05-06 DIAGNOSIS — E119 Type 2 diabetes mellitus without complications: Secondary | ICD-10-CM | POA: Insufficient documentation

## 2016-05-06 LAB — CBC
HCT: 39 % (ref 36.0–46.0)
Hemoglobin: 12.5 g/dL (ref 12.0–15.0)
MCH: 29.1 pg (ref 26.0–34.0)
MCHC: 32.1 g/dL (ref 30.0–36.0)
MCV: 90.7 fL (ref 78.0–100.0)
Platelets: 196 10*3/uL (ref 150–400)
RBC: 4.3 MIL/uL (ref 3.87–5.11)
RDW: 14.7 % (ref 11.5–15.5)
WBC: 8.9 10*3/uL (ref 4.0–10.5)

## 2016-05-06 LAB — BASIC METABOLIC PANEL
Anion gap: 12 (ref 5–15)
BUN: 13 mg/dL (ref 6–20)
CO2: 25 mmol/L (ref 22–32)
Calcium: 9.9 mg/dL (ref 8.9–10.3)
Chloride: 100 mmol/L — ABNORMAL LOW (ref 101–111)
Creatinine, Ser: 1.42 mg/dL — ABNORMAL HIGH (ref 0.44–1.00)
GFR calc Af Amer: 40 mL/min — ABNORMAL LOW (ref >60)
GFR calc non Af Amer: 35 mL/min — ABNORMAL LOW (ref >60)
Glucose, Bld: 104 mg/dL — ABNORMAL HIGH (ref 65–99)
Potassium: 5.1 mmol/L (ref 3.5–5.1)
Sodium: 137 mmol/L (ref 135–145)

## 2016-05-06 LAB — GLUCOSE, CAPILLARY: Glucose-Capillary: 99 mg/dL (ref 65–99)

## 2016-05-06 NOTE — Pre-Procedure Instructions (Signed)
[name]  05/06/2016      [facility] [place] [address] Phone: [phone] Fax: [phone]  [facility] [place], [place] [address] Phone: [phone] Fax: [phone]  [place], [place] [address] [place] [address] Phone: [phone] Fax: [phone]    Your procedure is scheduled on Wed, Feb 14 @ 8:30 AM  Report to [facility] Admitting at 6:30 AM  Call this number if you have problems the morning of surgery:  [phone]   Remember:  Do not eat food or drink liquids after midnight.    Take these medicines the morning of surgery with A SIP OF WATER Pantoprazole(Protonix) and Toprol.             Stop taking your Fish Oil along with any Vitamins or Herbal Medications. No Goody's,BC's,Aleve,Advil,Motrin,or Ibuprofen.            Stop plavix per Dr. Sharol Given     How to Manage Your Diabetes Before and After Surgery  Why is it important to control my blood sugar before and after surgery? . Improving blood sugar levels before and after surgery helps healing and can limit problems. . A way of improving blood sugar control is eating a healthy diet by: o  Eating less sugar and carbohydrates o  Increasing activity/exercise o  Talking with your doctor about reaching your blood sugar goals . High blood sugars (greater than 180 mg/dL) can raise your risk of infections and slow your recovery, so you will need to focus on controlling your diabetes during the weeks before surgery. . Make sure that the doctor who takes care of your diabetes knows about your planned surgery including the date and location.  How do I manage my blood sugar before surgery? . Check your blood sugar at least 4 times a day, starting 2 days before  surgery, to make sure that the level is not too high or low. o Check your blood sugar the morning of your surgery when you wake up and every 2 hours until you get to the Short Stay unit. . If your blood sugar is less than 70 mg/dL, you will need to treat for low blood sugar: o Do not take insulin. o Treat a low blood sugar (less than 70 mg/dL) with  cup of clear juice (cranberry or apple), 4 glucose tablets, OR glucose gel. o Recheck blood sugar in 15 minutes after treatment (to make sure it is greater than 70 mg/dL). If your blood sugar is not greater than 70 mg/dL on recheck, call [phone] for further instructions. . Report your blood sugar to the short stay nurse when you get to Short Stay.  . If you are admitted to the hospital after surgery: o Your blood sugar will be checked by the staff and you will probably be given insulin after surgery (instead of oral diabetes medicines) to make sure you have good blood sugar levels. o The goal for blood sugar control after surgery is 80-180 mg/dL.      WHAT DO I DO ABOUT MY DIABETES MEDICATION?   Marland Kitchen Do not take oral diabetes medicines (pills) the morning of surgery.     Do not wear jewelry, make-up or nail polish.  Do not  wear lotions, powders,  perfumes, or deoderant.  Do not shave 48 hours prior to surgery.    Do not bring valuables to the hospital.  [facility] is not responsible for any belongings or valuables.  Contacts, dentures or bridgework may not be worn into surgery.  Leave your suitcase in the car.  After surgery it may be brought to your room.  For patients admitted to the hospital, discharge time will be determined by your treatment team.  Patients discharged the day of surgery will not be allowed to drive home.    [facility]  Before surgery, you can play an important role.  Because skin is not sterile, your skin needs to be as free of germs as possible.  You can reduce the  number of germs on you skin by washing with CHG (chlorahexidine gluconate) soap before surgery.  CHG is an antiseptic cleaner which kills germs and bonds with the skin to continue killing germs even after washing.  Please DO NOT use if you have an allergy to CHG or antibacterial soaps.  If your skin becomes reddened/irritated stop using the CHG and inform your nurse when you arrive at Short Stay.  Do not shave (including legs and underarms) for at least 48 hours prior to the first CHG shower.  You may shave your face.  Please follow these instructions carefully:   1.  Shower with CHG Soap the night before surgery and the                                morning of Surgery.  2.  If you choose to wash your hair, wash your hair first as usual with your       normal shampoo.  3.  After you shampoo, rinse your hair and body thoroughly to remove the                      Shampoo.  4.  Use CHG as you would any other liquid soap.  You can apply chg directly       to the skin and wash gently with scrungie or a clean washcloth.  5.  Apply the CHG Soap to your body ONLY FROM THE NECK DOWN.        Do not use on open wounds or open sores.  Avoid contact with your eyes,       ears, mouth and genitals (private parts).  Wash genitals (private parts)       with your normal soap.  6.  Wash thoroughly, paying special attention to the area where your surgery        will be performed.  7.  Thoroughly rinse your body with warm water from the neck down.  8.  DO NOT shower/wash with your normal soap after using and rinsing off       the CHG Soap.  9.  Pat yourself dry with a clean towel.            10.  Wear clean pajamas.            11.  Place clean sheets on your bed the night of your first shower and do not        sleep with pets.  Day of Surgery  Do not apply any lotions/deoderants the morning of surgery.  Please wear clean clothes to the hospital/surgery center.  Please read over the following fact sheets that  you were given. Pain Booklet, Coughing and Deep Breathing and Surgical Site Infection Prevention

## 2016-05-06 NOTE — Progress Notes (Addendum)
PCP: Dr. Gerarda Fraction @ [facility] in [place] Cardiology: Dr. Glenetta Hew Nephrology: [name] Endrocrinology: Dr. Jacelyn Pi  Dr. Sharol Given had not instructed pt. On plavix. Called office, spoke to cheryl, she discussed with Dr. Sharol Given, he stated to continue plavix but to hold it day of surgery. Pt. And spouse informed.  Fasting sugars 100-110

## 2016-05-07 LAB — HEMOGLOBIN A1C
Hgb A1c MFr Bld: 5.8 % — ABNORMAL HIGH (ref 4.8–5.6)
Mean Plasma Glucose: 120 mg/dL

## 2016-05-07 NOTE — Progress Notes (Signed)
Anesthesia Chart Review:  Pt is a 76 year old female scheduled for L anterior tibial tendon reconstruction on 05/12/2016 with Meridee Score, MD.   - PCP is Redmond School, MD.  - Neurologist is Antony Contras, MD.  - Endocrinologist is Jacelyn Pi, MD - Cardiologist is Glenetta Hew, MD, last office visit 12/19/14; f/u in 6 months recommended  PMH includes:  CAD (s/p BMS to proximal LAD 2007; cutting balloon to LAD in stent restenosis, stent to distal LAD 05/2009), CHF, HTN, DM, hyperlipidemia, multiple TIAs, melanoma, mild cognitive impairment. Never smoker. BMI 28  Medications include: bumex, plavix, losartan, metformin, crestor, toprol XL. Pt to hold plavix DOS.   Preoperative labs reviewed.  HbA1c 5.8, glucose 104  CXR 09/02/15: No active cardiopulmonary disease.  EKG 11/17/15: sinus rhythm  Echo 11/17/15:  - Left ventricle: The cavity size was normal. Wall thickness was increased in a pattern of mild LVH. Systolic function was normal. The estimated ejection fraction was in the range of 55% to 60%. Wall motion was normal; there were no regional wall motion abnormalities. The study is not technically sufficient to allow evaluation of LV diastolic function. - Mitral valve: Mildly thickened leaflets . There was trivial regurgitation. - Left atrium: The atrium was normal in size. - Tricuspid valve: There was mild regurgitation. - Pulmonary arteries: PA peak pressure: 37 mm Hg (S). - Inferior vena cava: The vessel was dilated. The respirophasic diameter changes were blunted (< 50%), consistent with elevated central venous pressure. - Pericardium, extracardiac: There was no pericardial effusion. Apical echo free space overlying the apical RV, suggestive of probably fat pad.  Nuclear stress test 11/16/11: Normal myocardial perfusion study. This is a low risk scan. EF 70%.  Cardiac cath 06/30/09:  1. Widely patent proximal and mid LAD stents with evidence for 70% ostial narrowing in the small first  septal perforating artery and 80% ostial narrowing in the second septal perforating artery which arose from the proximal LAD stented segment.  2. Normal LCx system. 3. 20% narrowing in the RCA.  Reviewed case with Dr. Kalman Shan. If no changes, I anticipate pt can proceed with surgery as scheduled.   Willeen Cass, FNP-BC [facility] Short Stay Surgical Center/Anesthesiology Phone: [phone] 05/07/2016 4:15 PM

## 2016-05-11 ENCOUNTER — Encounter (HOSPITAL_COMMUNITY): Payer: Self-pay | Admitting: Anesthesiology

## 2016-05-11 NOTE — Anesthesia Preprocedure Evaluation (Addendum)
Anesthesia Evaluation  Patient identified by MRN, date of birth, ID band Patient awake    Reviewed: Allergy & Precautions, NPO status , Patient's Chart, lab work & pertinent test results, reviewed documented beta blocker date and time   Airway Mallampati: II  TM Distance: >3 FB Neck ROM: Full    Dental no notable dental hx. (+) Upper Dentures, Lower Dentures   Pulmonary neg pulmonary ROS,    Pulmonary exam normal breath sounds clear to auscultation       Cardiovascular hypertension, Pt. on medications and Pt. on home beta blockers + CAD, + Cardiac Stents and +CHF  Normal cardiovascular exam Rhythm:Regular Rate:Normal     Neuro/Psych  Headaches, PSYCHIATRIC DISORDERS Anxiety Diabetic peripheral neuropathy Dementia with mild cognitive imparment TIA Neuromuscular disease    GI/Hepatic Neg liver ROS, GERD  Medicated and Controlled,  Endo/Other  diabetes, Poorly Controlled, Type 2, Oral Hypoglycemic AgentsDyslipidemia  Renal/GU Renal InsufficiencyRenal disease   Hx/o hematuria     Musculoskeletal  (+) Arthritis , Osteoarthritis,  Traumatic rupture of left anterior tibial tendon   Abdominal   Peds  Hematology negative hematology ROS (+)   Anesthesia Other Findings   Reproductive/Obstetrics                            Anesthesia Physical Anesthesia Plan  ASA: III  Anesthesia Plan: General   Post-op Pain Management:    Induction: Intravenous  Airway Management Planned: Oral ETT  Additional Equipment:   Intra-op Plan:   Post-operative Plan: Extubation in OR  Informed Consent: I have reviewed the patients History and Physical, chart, labs and discussed the procedure including the risks, benefits and alternatives for the proposed anesthesia with the patient or authorized representative who has indicated his/her understanding and acceptance.     Plan Discussed with: CRNA,  Anesthesiologist and Surgeon  Anesthesia Plan Comments:        Anesthesia Quick Evaluation

## 2016-05-12 ENCOUNTER — Ambulatory Visit (HOSPITAL_COMMUNITY)
Admission: RE | Admit: 2016-05-12 | Discharge: 2016-05-12 | Disposition: A | Discharge status: Home / Self Care | Payer: Medicare Other | Source: Ambulatory Visit | Attending: Orthopedic Surgery | Admitting: Orthopedic Surgery

## 2016-05-12 ENCOUNTER — Ambulatory Visit (HOSPITAL_COMMUNITY): Payer: Medicare Other | Admitting: Anesthesiology

## 2016-05-12 ENCOUNTER — Encounter (HOSPITAL_COMMUNITY): Payer: Self-pay | Admitting: *Deleted

## 2016-05-12 ENCOUNTER — Ambulatory Visit (HOSPITAL_COMMUNITY): Payer: Medicare Other | Admitting: Emergency Medicine

## 2016-05-12 ENCOUNTER — Encounter (HOSPITAL_COMMUNITY)
Admission: RE | Disposition: A | Discharge status: Home / Self Care | Payer: Self-pay | Source: Ambulatory Visit | Attending: Orthopedic Surgery

## 2016-05-12 DIAGNOSIS — X58XXXA Exposure to other specified factors, initial encounter: Secondary | ICD-10-CM | POA: Insufficient documentation

## 2016-05-12 DIAGNOSIS — Z7984 Long term (current) use of oral hypoglycemic drugs: Secondary | ICD-10-CM | POA: Insufficient documentation

## 2016-05-12 DIAGNOSIS — S86212A Strain of muscle(s) and tendon(s) of anterior muscle group at lower leg level, left leg, initial encounter: Secondary | ICD-10-CM | POA: Diagnosis not present

## 2016-05-12 DIAGNOSIS — I130 Hypertensive heart and chronic kidney disease with heart failure and stage 1 through stage 4 chronic kidney disease, or unspecified chronic kidney disease: Secondary | ICD-10-CM | POA: Diagnosis not present

## 2016-05-12 DIAGNOSIS — Z96652 Presence of left artificial knee joint: Secondary | ICD-10-CM | POA: Insufficient documentation

## 2016-05-12 DIAGNOSIS — Z85820 Personal history of malignant melanoma of skin: Secondary | ICD-10-CM | POA: Diagnosis not present

## 2016-05-12 DIAGNOSIS — K219 Gastro-esophageal reflux disease without esophagitis: Secondary | ICD-10-CM | POA: Diagnosis not present

## 2016-05-12 DIAGNOSIS — S96812A Strain of other specified muscles and tendons at ankle and foot level, left foot, initial encounter: Secondary | ICD-10-CM | POA: Diagnosis not present

## 2016-05-12 DIAGNOSIS — Z955 Presence of coronary angioplasty implant and graft: Secondary | ICD-10-CM | POA: Insufficient documentation

## 2016-05-12 DIAGNOSIS — Z7902 Long term (current) use of antithrombotics/antiplatelets: Secondary | ICD-10-CM | POA: Diagnosis not present

## 2016-05-12 DIAGNOSIS — E1122 Type 2 diabetes mellitus with diabetic chronic kidney disease: Secondary | ICD-10-CM | POA: Insufficient documentation

## 2016-05-12 DIAGNOSIS — Z96632 Presence of left artificial wrist joint: Secondary | ICD-10-CM | POA: Insufficient documentation

## 2016-05-12 DIAGNOSIS — Z79899 Other long term (current) drug therapy: Secondary | ICD-10-CM | POA: Diagnosis not present

## 2016-05-12 DIAGNOSIS — E785 Hyperlipidemia, unspecified: Secondary | ICD-10-CM | POA: Insufficient documentation

## 2016-05-12 DIAGNOSIS — S96912S Strain of unspecified muscle and tendon at ankle and foot level, left foot, sequela: Secondary | ICD-10-CM | POA: Diagnosis not present

## 2016-05-12 DIAGNOSIS — E1140 Type 2 diabetes mellitus with diabetic neuropathy, unspecified: Secondary | ICD-10-CM | POA: Diagnosis not present

## 2016-05-12 DIAGNOSIS — Z79890 Hormone replacement therapy (postmenopausal): Secondary | ICD-10-CM | POA: Insufficient documentation

## 2016-05-12 DIAGNOSIS — Z8673 Personal history of transient ischemic attack (TIA), and cerebral infarction without residual deficits: Secondary | ICD-10-CM | POA: Insufficient documentation

## 2016-05-12 DIAGNOSIS — I2510 Atherosclerotic heart disease of native coronary artery without angina pectoris: Secondary | ICD-10-CM | POA: Insufficient documentation

## 2016-05-12 DIAGNOSIS — I509 Heart failure, unspecified: Secondary | ICD-10-CM | POA: Insufficient documentation

## 2016-05-12 DIAGNOSIS — G8918 Other acute postprocedural pain: Secondary | ICD-10-CM | POA: Diagnosis not present

## 2016-05-12 DIAGNOSIS — F0390 Unspecified dementia without behavioral disturbance: Secondary | ICD-10-CM | POA: Insufficient documentation

## 2016-05-12 DIAGNOSIS — N189 Chronic kidney disease, unspecified: Secondary | ICD-10-CM | POA: Insufficient documentation

## 2016-05-12 HISTORY — PX: TENDON REPAIR: SHX5111

## 2016-05-12 LAB — GLUCOSE, CAPILLARY
Glucose-Capillary: 105 mg/dL — ABNORMAL HIGH (ref 65–99)
Glucose-Capillary: 152 mg/dL — ABNORMAL HIGH (ref 65–99)

## 2016-05-12 SURGERY — TENDON REPAIR
Anesthesia: General | Site: Ankle | Laterality: Left

## 2016-05-12 MED ORDER — MEPERIDINE HCL 25 MG/ML IJ SOLN: 6.2500 mg | INTRAMUSCULAR | Status: DC | PRN

## 2016-05-12 MED ORDER — METOCLOPRAMIDE HCL 5 MG/ML IJ SOLN: 10.0000 mg | Freq: Once | INTRAMUSCULAR | Status: DC | PRN

## 2016-05-12 MED ORDER — FENTANYL CITRATE (PF) 100 MCG/2ML IJ SOLN
INTRAMUSCULAR | Status: AC
  Filled 2016-05-12: qty 4

## 2016-05-12 MED ORDER — PROPOFOL 10 MG/ML IV BOLUS
INTRAVENOUS | Status: DC | PRN
  Administered 2016-05-12: 120 mg via INTRAVENOUS

## 2016-05-12 MED ORDER — LACTATED RINGERS IV SOLN
INTRAVENOUS | Status: DC | PRN
  Administered 2016-05-12: 08:00:00 via INTRAVENOUS

## 2016-05-12 MED ORDER — FENTANYL CITRATE (PF) 100 MCG/2ML IJ SOLN
INTRAMUSCULAR | Status: DC | PRN
  Administered 2016-05-12: 100 ug via INTRAVENOUS
  Administered 2016-05-12 (×2): 50 ug via INTRAVENOUS

## 2016-05-12 MED ORDER — LIDOCAINE HCL (CARDIAC) 20 MG/ML IV SOLN
INTRAVENOUS | Status: DC | PRN
  Administered 2016-05-12: 60 mg via INTRAVENOUS

## 2016-05-12 MED ORDER — CEFAZOLIN SODIUM-DEXTROSE 2-4 GM/100ML-% IV SOLN
INTRAVENOUS | Status: AC
  Filled 2016-05-12: qty 100

## 2016-05-12 MED ORDER — FENTANYL CITRATE (PF) 100 MCG/2ML IJ SOLN
INTRAMUSCULAR | Status: AC
  Filled 2016-05-12: qty 2

## 2016-05-12 MED ORDER — EPHEDRINE SULFATE 50 MG/ML IJ SOLN
INTRAMUSCULAR | Status: DC | PRN
  Administered 2016-05-12: 15 mg via INTRAVENOUS
  Administered 2016-05-12: 10 mg via INTRAVENOUS

## 2016-05-12 MED ORDER — BUPIVACAINE-EPINEPHRINE (PF) 0.5% -1:200000 IJ SOLN
INTRAMUSCULAR | Status: DC | PRN
  Administered 2016-05-12: 30 mL via PERINEURAL

## 2016-05-12 MED ORDER — CHLORHEXIDINE GLUCONATE 4 % EX LIQD: 60.0000 mL | Freq: Once | CUTANEOUS | Status: DC

## 2016-05-12 MED ORDER — OXYCODONE HCL 5 MG PO TABS: 5.0000 mg | ORAL_TABLET | Freq: Four times a day (QID) | ORAL | 0 refills | Status: DC | PRN

## 2016-05-12 MED ORDER — LIDOCAINE 2% (20 MG/ML) 5 ML SYRINGE
INTRAMUSCULAR | Status: AC
  Filled 2016-05-12: qty 5

## 2016-05-12 MED ORDER — FENTANYL CITRATE (PF) 100 MCG/2ML IJ SOLN
25.0000 ug | INTRAMUSCULAR | Status: DC | PRN
  Administered 2016-05-12: 25 ug via INTRAVENOUS

## 2016-05-12 MED ORDER — ONDANSETRON HCL 4 MG/2ML IJ SOLN
INTRAMUSCULAR | Status: DC | PRN
Start: 2016-05-12 — End: 2016-05-12
  Administered 2016-05-12: 4 mg via INTRAVENOUS

## 2016-05-12 MED ORDER — 0.9 % SODIUM CHLORIDE (POUR BTL) OPTIME
TOPICAL | Status: DC | PRN
  Administered 2016-05-12: 1000 mL

## 2016-05-12 MED ORDER — PROPOFOL 10 MG/ML IV BOLUS
INTRAVENOUS | Status: AC
  Filled 2016-05-12: qty 20

## 2016-05-12 MED ORDER — CEFAZOLIN SODIUM-DEXTROSE 2-4 GM/100ML-% IV SOLN
2.0000 g | INTRAVENOUS | Status: AC
  Administered 2016-05-12: 2 g via INTRAVENOUS

## 2016-05-12 MED ORDER — EPHEDRINE 5 MG/ML INJ
INTRAVENOUS | Status: AC
Start: 2016-05-12 — End: 2016-05-12
  Filled 2016-05-12: qty 10

## 2016-05-12 SURGICAL SUPPLY — 46 items
BNDG CMPR 9X4 STRL LF SNTH (GAUZE/BANDAGES/DRESSINGS) ×1
BNDG COHESIVE 6X5 TAN STRL LF (GAUZE/BANDAGES/DRESSINGS) ×2 IMPLANT
BNDG ESMARK 4X9 LF (GAUZE/BANDAGES/DRESSINGS) ×3 IMPLANT
BNDG GAUZE ELAST 4 BULKY (GAUZE/BANDAGES/DRESSINGS) ×2 IMPLANT
CORDS BIPOLAR (ELECTRODE) ×3 IMPLANT
COVER SURGICAL LIGHT HANDLE (MISCELLANEOUS) ×6 IMPLANT
CUFF TOURNIQUET SINGLE 18IN (TOURNIQUET CUFF) ×3 IMPLANT
CUFF TOURNIQUET SINGLE 24IN (TOURNIQUET CUFF) IMPLANT
DRAPE U-SHAPE 47X51 STRL (DRAPES) ×3 IMPLANT
DRSG ADAPTIC 3X8 NADH LF (GAUZE/BANDAGES/DRESSINGS) ×2 IMPLANT
DRSG PAD ABDOMINAL 8X10 ST (GAUZE/BANDAGES/DRESSINGS) ×2 IMPLANT
DURAPREP 26ML APPLICATOR (WOUND CARE) ×3 IMPLANT
ELECT REM PT RETURN 9FT ADLT (ELECTROSURGICAL) ×3
ELECTRODE REM PT RTRN 9FT ADLT (ELECTROSURGICAL) ×1 IMPLANT
GAUZE SPONGE 4X4 12PLY STRL (GAUZE/BANDAGES/DRESSINGS) IMPLANT
GLOVE BIOGEL PI IND STRL 9 (GLOVE) ×1 IMPLANT
GLOVE BIOGEL PI INDICATOR 9 (GLOVE) ×2
GLOVE SURG ORTHO 9.0 STRL STRW (GLOVE) ×3 IMPLANT
GOWN STRL REUS W/ TWL XL LVL3 (GOWN DISPOSABLE) ×2 IMPLANT
GOWN STRL REUS W/TWL XL LVL3 (GOWN DISPOSABLE) ×6
KIT BASIN OR (CUSTOM PROCEDURE TRAY) ×3 IMPLANT
KIT ROOM TURNOVER OR (KITS) ×3 IMPLANT
NDL 1/2 CIR CATGUT .05X1.09 (NEEDLE) IMPLANT
NDL HYPO 25GX1X1/2 BEV (NEEDLE) IMPLANT
NEEDLE 1/2 CIR CATGUT .05X1.09 (NEEDLE) ×3 IMPLANT
NEEDLE HYPO 25GX1X1/2 BEV (NEEDLE) IMPLANT
NS IRRIG 1000ML POUR BTL (IV SOLUTION) ×3 IMPLANT
PACK ORTHO EXTREMITY (CUSTOM PROCEDURE TRAY) ×3 IMPLANT
PAD ARMBOARD 7.5X6 YLW CONV (MISCELLANEOUS) ×6 IMPLANT
PAD CAST 4YDX4 CTTN HI CHSV (CAST SUPPLIES) IMPLANT
PADDING CAST ABS 4INX4YD NS (CAST SUPPLIES) ×2
PADDING CAST ABS COTTON 4X4 ST (CAST SUPPLIES) IMPLANT
PADDING CAST COTTON 4X4 STRL (CAST SUPPLIES) ×3
SPLINT PLASTER CAST XFAST 5X30 (CAST SUPPLIES) IMPLANT
SPLINT PLASTER XFAST SET 5X30 (CAST SUPPLIES) ×2
SPONGE GAUZE 4X4 12PLY STER LF (GAUZE/BANDAGES/DRESSINGS) ×2 IMPLANT
SUT ETHILON 2 0 PSLX (SUTURE) IMPLANT
SUT FIBERWIRE #2 38 T-5 BLUE (SUTURE) ×9
SUT VIC AB 2-0 FS1 27 (SUTURE) IMPLANT
SUTURE FIBERWR #2 38 T-5 BLUE (SUTURE) IMPLANT
SYR CONTROL 10ML LL (SYRINGE) IMPLANT
TOWEL OR 17X24 6PK STRL BLUE (TOWEL DISPOSABLE) ×3 IMPLANT
TOWEL OR 17X26 10 PK STRL BLUE (TOWEL DISPOSABLE) ×3 IMPLANT
TUBE CONNECTING 12'X1/4 (SUCTIONS)
TUBE CONNECTING 12X1/4 (SUCTIONS) IMPLANT
WATER STERILE IRR 1000ML POUR (IV SOLUTION) ×3 IMPLANT

## 2016-05-12 NOTE — Anesthesia Procedure Notes (Addendum)
Anesthesia Regional Block:  Adductor canal block  Pre-Anesthetic Checklist: ,, timeout performed, Correct Patient, Correct Site, Correct Laterality, Correct Procedure, Correct Position, site marked, Risks and benefits discussed,  Surgical consent,  Pre-op evaluation,  At surgeon's request and post-op pain management  Laterality: Left  Prep: chloraprep       Needles:  Injection technique: Single-shot  Needle Type: Echogenic Stimulator Needle     Needle Length: 9cm 9 cm Needle Gauge: 21 and 21 G    Additional Needles: Adductor canal block Narrative:  Start time: 05/12/2016 8:05 AM End time: 05/12/2016 8:10 AM Injection made incrementally with aspirations every 5 mL.  Performed by: Personally  Anesthesiologist: Josephine Igo  Additional Notes: Timeout performed. Patient sedated. Relevant anatomy ID'd with [place]. Incremental injection of local anesthetic with frequent aspiration. Patient tolerated procedure well.

## 2016-05-12 NOTE — Anesthesia Procedure Notes (Signed)
Procedure Name: LMA Insertion Date/Time: 05/12/2016 8:26 AM Performed by: Lance Coon Pre-anesthesia Checklist: Patient identified, Emergency Drugs available, Suction available, Timeout performed and Patient being monitored Patient Re-evaluated:Patient Re-evaluated prior to inductionOxygen Delivery Method: Circle system utilized Preoxygenation: Pre-oxygenation with 100% oxygen Intubation Type: IV induction LMA: LMA inserted LMA Size: 4.0 Number of attempts: 1 Placement Confirmation: breath sounds checked- equal and bilateral Tube secured with: Tape Dental Injury: Teeth and Oropharynx as per pre-operative assessment

## 2016-05-12 NOTE — Transfer of Care (Signed)
Immediate Anesthesia Transfer of Care Note  Patient: [name]  Procedure(s) Performed: Procedure(s): Left Anterior Tibial Tendon Reconstruction (Left)  Patient Location: PACU  Anesthesia Type:General and GA combined with regional for post-op pain  Level of Consciousness: awake, alert , oriented and patient cooperative  Airway & Oxygen Therapy: Patient Spontanous Breathing  Post-op Assessment: Report given to RN and Post -op Vital signs reviewed and stable  Post vital signs: Reviewed and stable  Last Vitals:  Vitals:   05/12/16 0908 05/12/16 0909  BP: 131/75   Pulse:  (!) 102  Resp:  18  Temp:      Last Pain:  Vitals:   05/12/16 0717  TempSrc: Oral  PainSc:       Patients Stated Pain Goal: 2 (45/85/92 9244)  Complications: No apparent anesthesia complications

## 2016-05-12 NOTE — Anesthesia Postprocedure Evaluation (Signed)
Anesthesia Post Note  Patient: NALLELI LARGENT  Procedure(s) Performed: Procedure(s) (LRB): Left Anterior Tibial Tendon Reconstruction (Left)  Patient location during evaluation: PACU Anesthesia Type: General and Regional Level of consciousness: awake and alert and oriented Pain management: pain level controlled Vital Signs Assessment: post-procedure vital signs reviewed and stable Respiratory status: spontaneous breathing, nonlabored ventilation and respiratory function stable Cardiovascular status: blood pressure returned to baseline and stable Postop Assessment: no signs of nausea or vomiting Anesthetic complications: no       Last Vitals:  Vitals:   05/12/16 0930 05/12/16 0938  BP:  (!) 145/87  Pulse: 92 88  Resp: 17 16  Temp: 36.1 C     Last Pain:  Vitals:   05/12/16 0938  TempSrc:   PainSc: 0-No pain                 Chihiro Frey A.

## 2016-05-12 NOTE — Op Note (Signed)
05/12/2016  9:07 AM  PATIENT:  [name]    PRE-OPERATIVE DIAGNOSIS:  Torn Anterior Tibial Tendon Left Ankle  POST-OPERATIVE DIAGNOSIS:  Same  PROCEDURE:  Left Anterior Tibial Tendon Reconstruction  SURGEON:  Newt Minion, MD  PHYSICIAN ASSISTANT:None ANESTHESIA:   General  PREOPERATIVE INDICATIONS:  [name] is a  76 y.o. female with a diagnosis of Torn Anterior Tibial Tendon Left Ankle who failed conservative measures and elected for surgical management.    The risks benefits and alternatives were discussed with the patient preoperatively including but not limited to the risks of infection, bleeding, nerve injury, cardiopulmonary complications, the need for revision surgery, among others, and the patient was willing to proceed.  OPERATIVE IMPLANTS: None  OPERATIVE FINDINGS: Calcified degenerative tear of the anterior tibial tendon  OPERATIVE PROCEDURE: Patient brought the operating room and underwent a general anesthetic. After adequate levels anesthesia obtained patient's left lower extremity was prepped using DuraPrep draped into a sterile field a timeout was called. A dorsal incision was made over the anterior tibial tendon. Blunt dissection was carried down to the extensor sheath. This was incised. Patient had a large calcified degenerative tear of the anterior tibial tendon. This section was cut out approximately 2 cm was removed. Using #2 FiberWire this was woven in a Krakw technique through the proximal and distal stumps and with the foot dorsiflexed the tendon was repaired. This was then reinforced with a another FiberWire suture using Krakw technique. Patient had good repair had dorsiflexion past neutral. The wound was irrigated with normal saline. The incision was closed using 2-0 nylon. A posterior splint was applied with the patient's ankle at 90. Patient was extubated taken the PACU in stable condition for discharge to home.

## 2016-05-12 NOTE — Progress Notes (Signed)
Orthopedic Tech Progress Note Patient Details:  [name] December 10, 1940 [id number]  Ortho Devices Type of Ortho Device: Postop shoe/boot Ortho Device/Splint Location: lle Ortho Device/Splint Interventions: Application   Fumi Guadron 05/12/2016, 9:35 AM

## 2016-05-12 NOTE — H&P (Signed)
[name] is an 76 y.o. female.   Chief Complaint: Foot drop left. HPI: Patient is a 76 year old woman with diabetic insensate neuropathy status post traumatic rupture of her left anterior tibial tendon who has failed conservative treatment with bracing.  Past Medical History:  Diagnosis Date  . Anxiety   . Arthritis     osteoarthritis  . CAD S/P percutaneous coronary angioplasty 2007; March 2011   [facility] 3.0 mm 20 mm postdilated 3.6 mm in early mid LAD; status post ISR Cutting Balloon PTCA and March 11 along with PCI of distal mid lesion with a 3.0 mm 12 mm MultiLink vision BMS; the proximal stent causes jailing of SP1 and SP2 with ostial 70-80% lesions  . Cancer (HCC)    Melanoma, Squamous cell Carcinoma  . CHF (congestive heart failure) ([place])   . Chronic kidney disease   . Dementia    mild  . Diabetes mellitus type 2 with neurological manifestations ([place])   . Dyslipidemia, goal LDL below 70   . Full dentures   . GERD (gastroesophageal reflux disease)   . Headache(784.0)   . History of hematuria    Followed by Dr. Gaynelle Arabian  . Hypertension, essential, benign   . Nausea & vomiting 10/2015  . Peripheral neuropathy ([place])   . TIA (transient ischemic attack)    multiple in the past  . Wears glasses     Past Surgical History:  Procedure Laterality Date  . ABDOMINAL HYSTERECTOMY    . APPENDECTOMY    . BLADDER SUSPENSION    . CARDIAC CATHETERIZATION  02/24/2006   85% stenosis in the proximal portion of LAD-arrangements made for PCI on 02/25/2006  . CARDIAC CATHETERIZATION  02/25/2006   80% LAD lesion stented with a 3x69mm Liberte stent resulting in reduction of 80% lesion to 0% residual  . CARDIAC CATHETERIZATION  04/26/2006   Medical management  . CARDIAC CATHETERIZATION  06/24/2009   60-70% re-stenosis in the proximal LAD. A 3.25x15 cutting balloon, 3 inflations - 14atm-38sec, 13atm-39sec, and 12atm-40sec reduced to less than 10%. 60% stenosis of the mid/distal LAD  stented with a 3x98mm Multilink stent.  Marland Kitchen CARDIAC CATHETERIZATION  07/09/2009   Medical management  . CAROTID DOPPLER  06/24/2009   40-59% R ICA stenosis. No significant ICA stenosis noted  . CHOLECYSTECTOMY    . DOPPLER ECHOCARDIOGRAPHY  12/31/2009/July 2015   IsLV size & function, Gr 1 DD w/ mild TR.;; b)09/2013: 55-60%. Grade 1 DD, mild aortic sclerosis  . JOINT REPLACEMENT Left   . LESION REMOVAL Right 03/11/2015   Procedure:  EXCISION OF RIGHT PRE TIBIAL LESION;  Surgeon: Judeth Horn, MD;  Location: [facility] [place];  Service: General;  Laterality: Right;  . MASS EXCISION Left 06/10/2014   Procedure: EXCISION LEFT LOWER LEG LESION;  Surgeon: Doreen Salvage, MD;  Location: [facility];  Service: General;  Laterality: Left;  Marland Kitchen MASS EXCISION Left 09/05/2015   Procedure: EXCISION OF LEFT FOREARM  MASS;  Surgeon: Judeth Horn, MD;  Location: [facility];  Service: General;  Laterality: Left;  . NM MYOVIEW LTD  11/16/2011   5 beats a PVC during recovery.  No skin or infarction.  Reached 5 METs, EKG negative for ischemia   . SHOULDER ARTHROSCOPY  10/15  . SHOULDER ARTHROSCOPY  1995   left  . TOTAL KNEE ARTHROPLASTY  2005   left  . WRIST ARTHROPLASTY  2010   cancer lt wrist    Family History  Problem Relation  Age of Onset  . Diabetes Mother   . Hypertension Mother   . Kidney disease Mother   . Hypertension Father   . Emphysema Father   . Heart attack Father   . Heart disease Father   . Arthritis Sister   . Diabetes Sister   . Hypertension Sister   . Hypertension Brother   . Hyperlipidemia Brother   . Diabetes Brother   . Stroke Brother   . Diabetes Sister   . Hypertension Sister   . Cancer Sister     Cervical cancer  . Hypertension Brother   . Heart attack Daughter   . Hypertension Daughter   . COPD Daughter   . Cancer Daughter     Breast cancer  . Kidney disease Daughter     Kidney mass   Social History:  reports that she has never smoked.  She has never used smokeless tobacco. She reports that she does not drink alcohol or use drugs.  Allergies:  Allergies  Allergen Reactions  . Lipitor [Atorvastatin] Other (See Comments)    Bone and muscle pain  . Nsaids Swelling  . Reglan [Metoclopramide]     Tardive dyskinesia; paradoxical reaction not relieved by benadryl  . Tylenol [Acetaminophen] Other (See Comments)    "anything with tylenol in it" patient has Stage II Kidney Failure  . Ultram [Tramadol] Other (See Comments)    Pt has seizure activity with this medication    Medications Prior to Admission  Medication Sig Dispense Refill  . Biotin w/ Vitamins C & E (HAIR SKIN & NAILS GUMMIES PO) Take 2 tablets by mouth daily.     . bumetanide (BUMEX) 1 MG tablet Take 1.5 tablets (1.5 mg total) by mouth daily. Please schedule appointment for refills. (Patient taking differently: Take 2 mg by mouth daily. Please schedule appointment for refills.) 135 tablet 0  . Calcium Carb-Cholecalciferol (CALCIUM 600 + D PO) Take 1 tablet by mouth at bedtime.     . Cholecalciferol ([employer] D3) 2000 units CAPS Take 2,000 Units by mouth every evening.    . clopidogrel (PLAVIX) 75 MG tablet TAKE 1 TABLET DAILY (PLEASE SCHEDULE APPOINTMENT FOR REFILLS) (Patient taking differently: TAKE 1 TABLET (75 mg) DAILY (PLEASE SCHEDULE APPOINTMENT FOR REFILLS)) 90 tablet 2  . COD LIVER OIL PO Take 415 mg by mouth daily.    Marland Kitchen CRANBERRY PO Take 1 capsule by mouth daily.    Marland Kitchen estrogens, conjugated, (PREMARIN) 0.3 MG tablet Take 0.3 mg by mouth at bedtime.     . Fish Oil-Krill Oil (KRILL OIL PLUS PO) Take 1 capsule by mouth daily.     Marland Kitchen gabapentin (NEURONTIN) 300 MG capsule Take 600 mg by mouth at bedtime.     . Iron-FA-B Cmp-C-Biot-Probiotic (FUSION PLUS) CAPS takes 1 tablet daily  9  . losartan (COZAAR) 100 MG tablet take 1 tablet daily  3  . Melatonin 5 MG CAPS Take 5 mg by mouth at bedtime.    . metFORMIN (GLUCOPHAGE) 500 MG tablet Take 500 mg by mouth 2 (two) times  daily with a meal.    . Methylfol-Methylcob-Acetylcyst (CEREFOLIN NAC) 6-2-600 MG TABS TAKE 1 TABLET BY MOUTH DAILY. 90 each 1  . Omega-3 Fatty Acids (FISH OIL) 1200 MG CAPS Take 1,200 mg by mouth daily.     . pantoprazole (PROTONIX) 40 MG tablet TAKE 1 TABLET DAILY (CONTACT OFFICE FOR ADDITIONAL REFILLS, FINAL ATTEMPT) 90 tablet 0  . pramipexole (MIRAPEX) 0.25 MG tablet Take .25 mg at 4pm and  2 tabs (0.5mg )at bedtime (Patient taking differently: Take 0.25-0.5 mg by mouth 3 (three) times daily. Take .25 mg at 4pm and 2 tabs (0.5mg )at bedtime) 270 tablet 1  . Resveratrol (RESERVAPAK PO) Take 1 tablet by mouth every evening.     . rosuvastatin (CRESTOR) 20 MG tablet Take 20 mg by mouth at bedtime.    . TOPROL XL 50 MG 24 hr tablet TAKE ONE-HALF (1/2) TABLET DAILY (SCHEDULE APPOINTMENT FOR REFILLS) 45 tablet 0  . nitroGLYCERIN (NITROLINGUAL) 0.4 MG/SPRAY spray Place 1 spray under the tongue every 5 (five) minutes x 3 doses as needed for chest pain.     Marland Kitchen oxyCODONE (OXY IR/ROXICODONE) 5 MG immediate release tablet Take 1-2 tablets (5-10 mg total) by mouth every 6 (six) hours as needed for moderate pain, severe pain or breakthrough pain. (Patient not taking: Reported on 05/03/2016) 40 tablet 0    No results found for this or any previous visit (from the past 48 hour(s)). No results found.  Review of Systems  All other systems reviewed and are negative.   Blood pressure (!) 150/85, pulse 68, resp. rate 20, height 5\' 6"  (1.676 m), weight 172 lb (78 kg), SpO2 100 %. Physical Exam  Examination patient is alert and oriented 9 of the well-dressed normal affect normal respiratory effort she has a good dorsalis pedis pulse she has no active dorsiflexion of the ankle there is a palpable defect of the anterior tibial tendon at the level of the insertion. Assessment/Plan Assessment: Diabetic insensate neuropathy with anterior tibial tendon rupture.  Plan: We'll plan for anterior tibial tendon reconstruction.  Risk and benefits were discussed including infection neurovascular injury pain rerupture potential for amputation. Patient states she understands wish to proceed at this time.  Newt Minion, MD 05/12/2016, 7:07 AM

## 2016-05-13 ENCOUNTER — Encounter (HOSPITAL_COMMUNITY): Payer: Self-pay | Admitting: Orthopedic Surgery

## 2016-05-19 ENCOUNTER — Ambulatory Visit (INDEPENDENT_AMBULATORY_CARE_PROVIDER_SITE_OTHER): Payer: Medicare Other | Admitting: Orthopedic Surgery

## 2016-05-19 DIAGNOSIS — S86212D Strain of muscle(s) and tendon(s) of anterior muscle group at lower leg level, left leg, subsequent encounter: Secondary | ICD-10-CM

## 2016-05-19 NOTE — Progress Notes (Signed)
Office Visit Note   Patient: [name]           Date of Birth: 1940/10/16           MRN: [id number] Visit Date: 05/19/2016              Requested by: Redmond School, MD [address] [place], [place] [address] PCP: Glo Herring., MD   Assessment & Plan: Visit Diagnoses:  1. Traumatic rupture of left anterior tibial tendon, subsequent encounter     Plan: Continue protected weightbearing we will place her in a fracture boot continue with elevation follow-up in 1 week to harvest the sutures.  Follow-Up Instructions: Return in about 1 week (around 05/26/2016).   Orders:  No orders of the defined types were placed in this encounter.  No orders of the defined types were placed in this encounter.     Procedures: No procedures performed   Clinical Data: No additional findings.   Subjective: Chief Complaint  Patient presents with  . Left Leg - Routine Post Op    Patient returns after left anterior tibial reconstruction 05/12/2016.  She is one week post op. She states that she is doing well. Sutures are intact. She denies weightbearing. She is taking Oxycodone as needed for pain.     Review of Systems   Objective: Vital Signs: There were no vitals taken for this visit.  Physical Exam examination the incision is well-healed there is some swelling in the foot and ankle. Her foot is plantar grade. There is no redness no cellulitis no signs of infection.  Ortho Exam  Specialty Comments:  No specialty comments available.  Imaging: No results found.   PMFS History: Patient Active Problem List   Diagnosis Date Noted  . Tendon tear, ankle, left, sequela   . Traumatic rupture of left anterior tibial tendon 04/20/2016  . Diarrhea   . Acute on chronic kidney failure ([place])   . 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 Headache, migraine   . Unexplained weight loss 11/17/2015  . Sepsis, unspecified organism ([place]) 11/17/2015  .  Hyponatremia 11/17/2015  . Hypokalemia 11/17/2015  . Acute kidney injury superimposed on chronic kidney disease ([place]) 11/17/2015  . Restless legs syndrome 07/24/2015  . Mild cognitive impairment 01/08/2015  . Abnormality of gait 01/08/2015  . Dizziness 12/19/2014  . Preoperative cardiovascular examination 12/11/2013  . GERD (gastroesophageal reflux disease) 10/06/2013  . Nausea vomiting and diarrhea 10/06/2013  . Syncope 10/05/2013  . Dementia 12/06/2012  . Chest pain at rest 10/28/2012  . Anxiety 10/28/2012    Class: Acute  . Edema of both legs 10/28/2012  . CAD S/P percutaneous coronary angioplasty   . Essential hypertension   . Dyslipidemia, goal LDL below 70    Past Medical History:  Diagnosis Date  . Anxiety   . Arthritis     osteoarthritis  . CAD S/P percutaneous coronary angioplasty 2007; March 2011   [facility] 3.0 mm 20 mm postdilated 3.6 mm in early mid LAD; status post ISR Cutting Balloon PTCA and March 11 along with PCI of distal mid lesion with a 3.0 mm 12 mm MultiLink vision BMS; the proximal stent causes jailing of SP1 and SP2 with ostial 70-80% lesions  . Cancer (HCC)    Melanoma, Squamous cell Carcinoma  . CHF (congestive heart failure) ([place])   . Chronic kidney disease   . Dementia    mild  . Diabetes mellitus type 2 with neurological manifestations (  HCC)   . Dyslipidemia, goal LDL below 70   . Full dentures   . GERD (gastroesophageal reflux disease)   . Headache(784.0)   . History of hematuria    Followed by Dr. Gaynelle Arabian  . Hypertension, essential, benign   . Nausea & vomiting 10/2015  . Peripheral neuropathy ([place])   . TIA (transient ischemic attack)    multiple in the past  . Wears glasses     Family History  Problem Relation Age of Onset  . Diabetes Mother   . Hypertension Mother   . Kidney disease Mother   . Hypertension Father   . Emphysema Father   . Heart attack Father   . Heart disease Father   . Arthritis Sister   . Diabetes  Sister   . Hypertension Sister   . Hypertension Brother   . Hyperlipidemia Brother   . Diabetes Brother   . Stroke Brother   . Diabetes Sister   . Hypertension Sister   . Cancer Sister     Cervical cancer  . Hypertension Brother   . Heart attack Daughter   . Hypertension Daughter   . COPD Daughter   . Cancer Daughter     Breast cancer  . Kidney disease Daughter     Kidney mass    Past Surgical History:  Procedure Laterality Date  . ABDOMINAL HYSTERECTOMY    . APPENDECTOMY    . BLADDER SUSPENSION    . CARDIAC CATHETERIZATION  02/24/2006   85% stenosis in the proximal portion of LAD-arrangements made for PCI on 02/25/2006  . CARDIAC CATHETERIZATION  02/25/2006   80% LAD lesion stented with a 3x88mm Liberte stent resulting in reduction of 80% lesion to 0% residual  . CARDIAC CATHETERIZATION  04/26/2006   Medical management  . CARDIAC CATHETERIZATION  06/24/2009   60-70% re-stenosis in the proximal LAD. A 3.25x15 cutting balloon, 3 inflations - 14atm-38sec, 13atm-39sec, and 12atm-40sec reduced to less than 10%. 60% stenosis of the mid/distal LAD stented with a 3x11mm Multilink stent.  Marland Kitchen CARDIAC CATHETERIZATION  07/09/2009   Medical management  . CAROTID DOPPLER  06/24/2009   40-59% R ICA stenosis. No significant ICA stenosis noted  . CHOLECYSTECTOMY    . DOPPLER ECHOCARDIOGRAPHY  12/31/2009/July 2015   IsLV size & function, Gr 1 DD w/ mild TR.;; b)09/2013: 55-60%. Grade 1 DD, mild aortic sclerosis  . JOINT REPLACEMENT Left   . LESION REMOVAL Right 03/11/2015   Procedure:  EXCISION OF RIGHT PRE TIBIAL LESION;  Surgeon: Judeth Horn, MD;  Location: [facility];  Service: General;  Laterality: Right;  . MASS EXCISION Left 06/10/2014   Procedure: EXCISION LEFT LOWER LEG LESION;  Surgeon: Doreen Salvage, MD;  Location: [facility];  Service: General;  Laterality: Left;  Marland Kitchen MASS EXCISION Left 09/05/2015   Procedure: EXCISION OF LEFT FOREARM  MASS;  Surgeon: Judeth Horn,  MD;  Location: [facility];  Service: General;  Laterality: Left;  . NM MYOVIEW LTD  11/16/2011   5 beats a PVC during recovery.  No skin or infarction.  Reached 5 METs, EKG negative for ischemia   . SHOULDER ARTHROSCOPY  10/15  . SHOULDER ARTHROSCOPY  1995   left  . TENDON REPAIR Left 05/12/2016   Procedure: Left Anterior Tibial Tendon Reconstruction;  Surgeon: Newt Minion, MD;  Location: [facility];  Service: Orthopedics;  Laterality: Left;  . TOTAL KNEE ARTHROPLASTY  2005   left  . WRIST ARTHROPLASTY  2010  cancer lt wrist   Social History   Occupational History  . retired Retired   Social History Main Topics  . Smoking status: Never Smoker  . Smokeless tobacco: Never Used  . Alcohol use No  . Drug use: No  . Sexual activity: No

## 2016-05-24 DIAGNOSIS — B029 Zoster without complications: Secondary | ICD-10-CM | POA: Diagnosis not present

## 2016-05-26 ENCOUNTER — Ambulatory Visit (INDEPENDENT_AMBULATORY_CARE_PROVIDER_SITE_OTHER): Payer: Medicare Other | Admitting: Orthopedic Surgery

## 2016-05-26 DIAGNOSIS — E1165 Type 2 diabetes mellitus with hyperglycemia: Secondary | ICD-10-CM

## 2016-05-26 DIAGNOSIS — S96912S Strain of unspecified muscle and tendon at ankle and foot level, left foot, sequela: Secondary | ICD-10-CM

## 2016-05-26 DIAGNOSIS — E118 Type 2 diabetes mellitus with unspecified complications: Secondary | ICD-10-CM

## 2016-05-26 DIAGNOSIS — S86212S Strain of muscle(s) and tendon(s) of anterior muscle group at lower leg level, left leg, sequela: Secondary | ICD-10-CM

## 2016-05-26 NOTE — Progress Notes (Signed)
Office Visit Note   Patient: [name]           Date of Birth: [birth year]           MRN: [id number] Visit Date: 05/26/2016              Requested by: Redmond School, MD [address] [place], [place] [address] PCP: Glo Herring., MD   Assessment & Plan: Visit Diagnoses:  1. Tendon tear, ankle, left, sequela   2. Traumatic rupture of left anterior tibial tendon, sequela   3. 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 They will continue with daily wound cleansing and dry dressings. Continue fracture boot continue nonweightbearing. Follow-up in 2 more weeks.  Follow-Up Instructions: Return in about 2 weeks (around 06/09/2016).   Orders:  No orders of the defined types were placed in this encounter.  No orders of the defined types were placed in this encounter.     Procedures: No procedures performed   Clinical Data: No additional findings.   Subjective: Chief Complaint  Patient presents with  . Left Ankle - Routine Post Op    Post-op follow up for left anterior tibial tendon reconstruction on 05/12/16.  Patient is doing well and wearing CAM boot, she is non-weight bearing.  Incision looks good, no drainage.  Stitches are intact.  Patient takes oxycodone as needed for pain.  Patient is here today with shingles.  Is taking gabapentin 3 times a day for her shingles. States this is helping control her neuropathic pain.    Review of Systems  Constitutional: Negative for chills and fever.  Skin: Positive for color change and rash.     Objective: Vital Signs: There were no vitals taken for this visit.  Physical Exam Right ankle incision anteriorly is well-healed. Sutures harvested today without incident. There is minimal swelling. No erythema and no odor no drainage no sign of infection. Ankle is nontender to palpation.  Ortho Exam  Specialty  Comments:  No specialty comments available.  Imaging: No results found.   PMFS History: Patient Active Problem List   Diagnosis Date Noted  . Tendon tear, ankle, left, sequela   . Traumatic rupture of left anterior tibial tendon 04/20/2016  . Diarrhea   . Acute on chronic kidney failure ([place])   . 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 Headache, migraine   . Unexplained weight loss 11/17/2015  . Sepsis, unspecified organism ([place]) 11/17/2015  . Hyponatremia 11/17/2015  . Hypokalemia 11/17/2015  . Acute kidney injury superimposed on chronic kidney disease ([place]) 11/17/2015  . Restless legs syndrome 07/24/2015  . Mild cognitive impairment 01/08/2015  . Abnormality of gait 01/08/2015  . Dizziness 12/19/2014  . Preoperative cardiovascular examination 12/11/2013  . GERD (gastroesophageal reflux disease) 10/06/2013  . Nausea vomiting and diarrhea 10/06/2013  . Syncope 10/05/2013  . Dementia 12/06/2012  . Chest pain at rest 10/28/2012  . Anxiety 10/28/2012    Class: Acute  . Edema of both legs 10/28/2012  . CAD S/P percutaneous coronary angioplasty   . Essential hypertension   . Dyslipidemia, goal LDL below 70    Past Medical History:  Diagnosis Date  . Anxiety   . Arthritis     osteoarthritis  . CAD S/P percutaneous coronary angioplasty 2007; March 2011   [facility] 3.0 mm 20 mm postdilated 3.6 mm in early mid LAD; status post  ISR Cutting Balloon PTCA and March 11 along with PCI of distal mid lesion with a 3.0 mm 12 mm MultiLink vision BMS; the proximal stent causes jailing of SP1 and SP2 with ostial 70-80% lesions  . Cancer (HCC)    Melanoma, Squamous cell Carcinoma  . CHF (congestive heart failure) ([place])   . Chronic kidney disease   . Dementia    mild  . Diabetes mellitus type 2 with neurological manifestations ([place])   . Dyslipidemia, goal LDL below 70   . Full dentures   . GERD (gastroesophageal reflux disease)   . Headache(784.0)   .  History of hematuria    Followed by Dr. Gaynelle Arabian  . Hypertension, essential, benign   . Nausea & vomiting 10/2015  . Peripheral neuropathy ([place])   . TIA (transient ischemic attack)    multiple in the past  . Wears glasses     Family History  Problem Relation Age of Onset  . Diabetes Mother   . Hypertension Mother   . Kidney disease Mother   . Hypertension Father   . Emphysema Father   . Heart attack Father   . Heart disease Father   . Arthritis Sister   . Diabetes Sister   . Hypertension Sister   . Hypertension Brother   . Hyperlipidemia Brother   . Diabetes Brother   . Stroke Brother   . Diabetes Sister   . Hypertension Sister   . Cancer Sister     Cervical cancer  . Hypertension Brother   . Heart attack Daughter   . Hypertension Daughter   . COPD Daughter   . Cancer Daughter     Breast cancer  . Kidney disease Daughter     Kidney mass    Past Surgical History:  Procedure Laterality Date  . ABDOMINAL HYSTERECTOMY    . APPENDECTOMY    . BLADDER SUSPENSION    . CARDIAC CATHETERIZATION  02/24/2006   85% stenosis in the proximal portion of LAD-arrangements made for PCI on 02/25/2006  . CARDIAC CATHETERIZATION  02/25/2006   80% LAD lesion stented with a 3x53mm Liberte stent resulting in reduction of 80% lesion to 0% residual  . CARDIAC CATHETERIZATION  04/26/2006   Medical management  . CARDIAC CATHETERIZATION  06/24/2009   60-70% re-stenosis in the proximal LAD. A 3.25x15 cutting balloon, 3 inflations - 14atm-38sec, 13atm-39sec, and 12atm-40sec reduced to less than 10%. 60% stenosis of the mid/distal LAD stented with a 3x18mm Multilink stent.  Marland Kitchen CARDIAC CATHETERIZATION  07/09/2009   Medical management  . CAROTID DOPPLER  06/24/2009   40-59% R ICA stenosis. No significant ICA stenosis noted  . CHOLECYSTECTOMY    . DOPPLER ECHOCARDIOGRAPHY  12/31/2009/July 2015   IsLV size & function, Gr 1 DD w/ mild TR.;; b)09/2013: 55-60%. Grade 1 DD, mild aortic sclerosis  . JOINT  REPLACEMENT Left   . LESION REMOVAL Right 03/11/2015   Procedure:  EXCISION OF RIGHT PRE TIBIAL LESION;  Surgeon: Judeth Horn, MD;  Location: [facility];  Service: General;  Laterality: Right;  . MASS EXCISION Left 06/10/2014   Procedure: EXCISION LEFT LOWER LEG LESION;  Surgeon: Doreen Salvage, MD;  Location: [facility];  Service: General;  Laterality: Left;  Marland Kitchen MASS EXCISION Left 09/05/2015   Procedure: EXCISION OF LEFT FOREARM  MASS;  Surgeon: Judeth Horn, MD;  Location: [facility];  Service: General;  Laterality: Left;  . NM MYOVIEW LTD  11/16/2011   5 beats a PVC during  recovery.  No skin or infarction.  Reached 5 METs, EKG negative for ischemia   . SHOULDER ARTHROSCOPY  10/15  . SHOULDER ARTHROSCOPY  1995   left  . TENDON REPAIR Left 05/12/2016   Procedure: Left Anterior Tibial Tendon Reconstruction;  Surgeon: Newt Minion, MD;  Location: [facility];  Service: Orthopedics;  Laterality: Left;  . TOTAL KNEE ARTHROPLASTY  2005   left  . WRIST ARTHROPLASTY  2010   cancer lt wrist   Social History   Occupational History  . retired Retired   Social History Main Topics  . Smoking status: Never Smoker  . Smokeless tobacco: Never Used  . Alcohol use No  . Drug use: No  . Sexual activity: No

## 2016-06-06 DIAGNOSIS — B029 Zoster without complications: Secondary | ICD-10-CM | POA: Diagnosis not present

## 2016-06-14 ENCOUNTER — Encounter (INDEPENDENT_AMBULATORY_CARE_PROVIDER_SITE_OTHER): Payer: Self-pay | Admitting: Orthopedic Surgery

## 2016-06-14 ENCOUNTER — Ambulatory Visit (INDEPENDENT_AMBULATORY_CARE_PROVIDER_SITE_OTHER): Payer: Medicare Other | Admitting: Orthopedic Surgery

## 2016-06-14 DIAGNOSIS — S96912S Strain of unspecified muscle and tendon at ankle and foot level, left foot, sequela: Secondary | ICD-10-CM

## 2016-06-14 NOTE — Progress Notes (Signed)
Office Visit Note   Patient: [name]           Date of Birth: 1940/12/20           MRN: [id number] Visit Date: 06/14/2016              Requested by: Redmond School, MD [address] [place], [place] [address] PCP: Glo Herring., MD  Chief Complaint  Patient presents with  . Left Ankle - Follow-up    HPI: Post-op follow up for left anterior tibial tendon reconstruction on 05/12/16.  Patient is wearing CAM boot, she is non-weight bearing. There is redness over dorsal part of foot, and feels warmth in it on occasion. She complains of greater pain from her shingles, which she is taking gabapentin tid. Maxcine Ham, RT    Assessment & Plan: Visit Diagnoses:  1. Tendon tear, ankle, left, sequela     Plan: Patient may advance to weightbearing as tolerated in a fracture boot she is to wear her compression stockings. Follow up in 4 weeks at which time we'll advance her to regular shoewear.  Follow-Up Instructions: Return in about 4 weeks (around 07/12/2016).   Ortho Exam Examination patient has good dorsiflexion of the ankle with good continuity of the anterior tibial tendon reconstruction. Patient is asymptomatic has no complaints she does have venous stasis swelling in both legs. There is no redness no synovitis no signs of infection the incision is well-healed. ROS: Complete review of systems negative except as mentioned in history of present illness. Imaging: No results found.  Labs: Lab Results  Component Value Date   HGBA1C 5.8 (H) 05/06/2016   HGBA1C 6.0 (H) 11/17/2015   HGBA1C 5.9 (H) 10/05/2013   REPTSTATUS 11/18/2015 FINAL 11/17/2015   CULT (A) 11/17/2015    50,000 COLONIES/mL LACTOBACILLUS SPECIES Standardized susceptibility testing for this organism is not available.    LABORGA ESCHERICHIA COLI 12/31/2009    Orders:  No orders of the defined types were placed in this encounter.  No orders of the defined types were placed in this  encounter.    Procedures: No procedures performed  Clinical Data: No additional findings.  Subjective: Review of Systems  Objective: Vital Signs: There were no vitals taken for this visit.  Specialty Comments:  No specialty comments available.  PMFS History: Patient Active Problem List   Diagnosis Date Noted  . Tendon tear, ankle, left, sequela   . Traumatic rupture of left anterior tibial tendon 04/20/2016  . Diarrhea   . Acute on chronic kidney failure ([place])   . 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 Headache, migraine   . Unexplained weight loss 11/17/2015  . Sepsis, unspecified organism ([place]) 11/17/2015  . Hyponatremia 11/17/2015  . Hypokalemia 11/17/2015  . Acute kidney injury superimposed on chronic kidney disease ([place]) 11/17/2015  . Restless legs syndrome 07/24/2015  . Mild cognitive impairment 01/08/2015  . Abnormality of gait 01/08/2015  . Dizziness 12/19/2014  . Preoperative cardiovascular examination 12/11/2013  . GERD (gastroesophageal reflux disease) 10/06/2013  . Nausea vomiting and diarrhea 10/06/2013  . Syncope 10/05/2013  . Dementia 12/06/2012  . Chest pain at rest 10/28/2012  . Anxiety 10/28/2012    Class: Acute  . Edema of both legs 10/28/2012  . CAD S/P percutaneous coronary angioplasty   . Essential hypertension   . Dyslipidemia, goal LDL below 70    Past Medical History:  Diagnosis Date  . Anxiety   . Arthritis  osteoarthritis  . CAD S/P percutaneous coronary angioplasty 2007; March 2011   [facility] 3.0 mm 20 mm postdilated 3.6 mm in early mid LAD; status post ISR Cutting Balloon PTCA and March 11 along with PCI of distal mid lesion with a 3.0 mm 12 mm MultiLink vision BMS; the proximal stent causes jailing of SP1 and SP2 with ostial 70-80% lesions  . Cancer (HCC)    Melanoma, Squamous cell Carcinoma  . CHF (congestive heart failure) ([place])   . Chronic kidney disease   . Dementia    mild  . Diabetes  mellitus type 2 with neurological manifestations ([place])   . Dyslipidemia, goal LDL below 70   . Full dentures   . GERD (gastroesophageal reflux disease)   . Headache(784.0)   . History of hematuria    Followed by Dr. Gaynelle Arabian  . Hypertension, essential, benign   . Nausea & vomiting 10/2015  . Peripheral neuropathy ([place])   . TIA (transient ischemic attack)    multiple in the past  . Wears glasses     Family History  Problem Relation Age of Onset  . Diabetes Mother   . Hypertension Mother   . Kidney disease Mother   . Hypertension Father   . Emphysema Father   . Heart attack Father   . Heart disease Father   . Arthritis Sister   . Diabetes Sister   . Hypertension Sister   . Hypertension Brother   . Hyperlipidemia Brother   . Diabetes Brother   . Stroke Brother   . Diabetes Sister   . Hypertension Sister   . Cancer Sister     Cervical cancer  . Hypertension Brother   . Heart attack Daughter   . Hypertension Daughter   . COPD Daughter   . Cancer Daughter     Breast cancer  . Kidney disease Daughter     Kidney mass    Past Surgical History:  Procedure Laterality Date  . ABDOMINAL HYSTERECTOMY    . APPENDECTOMY    . BLADDER SUSPENSION    . CARDIAC CATHETERIZATION  02/24/2006   85% stenosis in the proximal portion of LAD-arrangements made for PCI on 02/25/2006  . CARDIAC CATHETERIZATION  02/25/2006   80% LAD lesion stented with a 3x10mm Liberte stent resulting in reduction of 80% lesion to 0% residual  . CARDIAC CATHETERIZATION  04/26/2006   Medical management  . CARDIAC CATHETERIZATION  06/24/2009   60-70% re-stenosis in the proximal LAD. A 3.25x15 cutting balloon, 3 inflations - 14atm-38sec, 13atm-39sec, and 12atm-40sec reduced to less than 10%. 60% stenosis of the mid/distal LAD stented with a 3x57mm Multilink stent.  Marland Kitchen CARDIAC CATHETERIZATION  07/09/2009   Medical management  . CAROTID DOPPLER  06/24/2009   40-59% R ICA stenosis. No significant ICA stenosis noted   . CHOLECYSTECTOMY    . DOPPLER ECHOCARDIOGRAPHY  12/31/2009/July 2015   IsLV size & function, Gr 1 DD w/ mild TR.;; b)09/2013: 55-60%. Grade 1 DD, mild aortic sclerosis  . JOINT REPLACEMENT Left   . LESION REMOVAL Right 03/11/2015   Procedure:  EXCISION OF RIGHT PRE TIBIAL LESION;  Surgeon: Judeth Horn, MD;  Location: [facility];  Service: General;  Laterality: Right;  . MASS EXCISION Left 06/10/2014   Procedure: EXCISION LEFT LOWER LEG LESION;  Surgeon: Doreen Salvage, MD;  Location: [facility];  Service: General;  Laterality: Left;  Marland Kitchen MASS EXCISION Left 09/05/2015   Procedure: EXCISION OF LEFT FOREARM  MASS;  Surgeon: Jeneen Rinks  Hulen Skains, MD;  Location: [facility];  Service: General;  Laterality: Left;  . NM MYOVIEW LTD  11/16/2011   5 beats a PVC during recovery.  No skin or infarction.  Reached 5 METs, EKG negative for ischemia   . SHOULDER ARTHROSCOPY  10/15  . SHOULDER ARTHROSCOPY  1995   left  . TENDON REPAIR Left 05/12/2016   Procedure: Left Anterior Tibial Tendon Reconstruction;  Surgeon: Newt Minion, MD;  Location: [facility];  Service: Orthopedics;  Laterality: Left;  . TOTAL KNEE ARTHROPLASTY  2005   left  . WRIST ARTHROPLASTY  2010   cancer lt wrist   Social History   Occupational History  . retired Retired   Social History Main Topics  . Smoking status: Never Smoker  . Smokeless tobacco: Never Used  . Alcohol use No  . Drug use: No  . Sexual activity: No

## 2016-06-19 ENCOUNTER — Other Ambulatory Visit: Payer: Self-pay | Admitting: Cardiology

## 2016-06-21 NOTE — Telephone Encounter (Signed)
Rx(s) sent to pharmacy electronically.  

## 2016-06-24 DIAGNOSIS — E119 Type 2 diabetes mellitus without complications: Secondary | ICD-10-CM | POA: Diagnosis not present

## 2016-06-24 DIAGNOSIS — M069 Rheumatoid arthritis, unspecified: Secondary | ICD-10-CM | POA: Diagnosis not present

## 2016-06-24 DIAGNOSIS — Z6827 Body mass index (BMI) 27.0-27.9, adult: Secondary | ICD-10-CM | POA: Diagnosis not present

## 2016-06-24 DIAGNOSIS — B0229 Other postherpetic nervous system involvement: Secondary | ICD-10-CM | POA: Diagnosis not present

## 2016-06-24 DIAGNOSIS — L708 Other acne: Secondary | ICD-10-CM | POA: Diagnosis not present

## 2016-06-24 DIAGNOSIS — F419 Anxiety disorder, unspecified: Secondary | ICD-10-CM | POA: Diagnosis not present

## 2016-06-24 DIAGNOSIS — E663 Overweight: Secondary | ICD-10-CM | POA: Diagnosis not present

## 2016-06-24 DIAGNOSIS — B029 Zoster without complications: Secondary | ICD-10-CM | POA: Diagnosis not present

## 2016-06-24 DIAGNOSIS — Z1389 Encounter for screening for other disorder: Secondary | ICD-10-CM | POA: Diagnosis not present

## 2016-06-24 DIAGNOSIS — I10 Essential (primary) hypertension: Secondary | ICD-10-CM | POA: Diagnosis not present

## 2016-06-24 DIAGNOSIS — K219 Gastro-esophageal reflux disease without esophagitis: Secondary | ICD-10-CM | POA: Diagnosis not present

## 2016-06-27 ENCOUNTER — Other Ambulatory Visit: Payer: Self-pay | Admitting: Cardiology

## 2016-06-28 ENCOUNTER — Ambulatory Visit (HOSPITAL_COMMUNITY): Payer: Medicare Other

## 2016-06-28 ENCOUNTER — Ambulatory Visit (HOSPITAL_COMMUNITY)
Admission: RE | Admit: 2016-06-28 | Discharge: 2016-06-28 | Disposition: A | Discharge status: Home / Self Care | Payer: Medicare Other | Source: Ambulatory Visit | Attending: Internal Medicine | Admitting: Internal Medicine

## 2016-06-28 ENCOUNTER — Other Ambulatory Visit (HOSPITAL_COMMUNITY): Payer: Self-pay | Admitting: Internal Medicine

## 2016-06-28 DIAGNOSIS — R410 Disorientation, unspecified: Secondary | ICD-10-CM

## 2016-06-28 DIAGNOSIS — R4182 Altered mental status, unspecified: Secondary | ICD-10-CM

## 2016-06-28 DIAGNOSIS — I998 Other disorder of circulatory system: Secondary | ICD-10-CM | POA: Insufficient documentation

## 2016-06-28 DIAGNOSIS — S0990XA Unspecified injury of head, initial encounter: Secondary | ICD-10-CM | POA: Diagnosis not present

## 2016-06-28 DIAGNOSIS — W19XXXA Unspecified fall, initial encounter: Secondary | ICD-10-CM | POA: Insufficient documentation

## 2016-06-28 MED ORDER — CLOPIDOGREL BISULFATE 75 MG PO TABS: 75.0000 mg | ORAL_TABLET | Freq: Every day | ORAL | 0 refills | Status: DC

## 2016-07-12 ENCOUNTER — Ambulatory Visit (INDEPENDENT_AMBULATORY_CARE_PROVIDER_SITE_OTHER): Payer: Medicare Other

## 2016-07-12 ENCOUNTER — Ambulatory Visit (INDEPENDENT_AMBULATORY_CARE_PROVIDER_SITE_OTHER): Payer: Medicare Other | Admitting: Family

## 2016-07-12 ENCOUNTER — Encounter (INDEPENDENT_AMBULATORY_CARE_PROVIDER_SITE_OTHER): Payer: Self-pay | Admitting: Family

## 2016-07-12 ENCOUNTER — Ambulatory Visit (INDEPENDENT_AMBULATORY_CARE_PROVIDER_SITE_OTHER): Payer: Medicare Other | Admitting: Orthopedic Surgery

## 2016-07-12 DIAGNOSIS — M25572 Pain in left ankle and joints of left foot: Secondary | ICD-10-CM

## 2016-07-12 DIAGNOSIS — S96912S Strain of unspecified muscle and tendon at ankle and foot level, left foot, sequela: Secondary | ICD-10-CM

## 2016-07-12 NOTE — Progress Notes (Signed)
Post-Op Visit Note   Patient: [name]           Date of Birth: 02-01-1941           MRN: [id number] Visit Date: 07/12/2016 PCP: Glo Herring, MD  Chief Complaint:  Chief Complaint  Patient presents with  . Left Leg - Follow-up    Wearing cam boot, states leg is tired.  She has the shingles.    HPI:  The patient is a 76 year old woman who presents today 2 months status post repair of the anterior tibial tendon on the left. This has been in a cam walker full weightbearing for the last month. She states she feels that she is doing well from the surgery however complains of pain beneath the fifth metatarsal base on the left. States it's tender and sore with ambulation.    Ortho Exam The incision is well-healed there is no erythema no open areas no rednesssign of infection. Does have tenderness to the lateral foot and the base of the fifth metatarsal. Dorsiflexion just to neutral.  Visit Diagnoses:  1. Tendon tear, ankle, left, sequela   2. Pain of joint of left ankle and foot     Plan: Reassurance provided. Discussed the importance of heel cord stretching. This is demonstrated for her today. She'll resume regular shoewear and slowly get back to activities as tolerated.  Follow-Up Instructions: No Follow-up on file.   Imaging: No results found.  Orders:  Orders Placed This Encounter  Procedures  . XR Foot Complete Left   No orders of the defined types were placed in this encounter.    PMFS History: Patient Active Problem List   Diagnosis Date Noted  . Tendon tear, ankle, left, sequela   . Traumatic rupture of left anterior tibial tendon 04/20/2016  . Diarrhea   . Acute on chronic kidney failure ([place])   . 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 Headache, migraine   . Unexplained weight loss 11/17/2015  . Sepsis, unspecified organism ([place]) 11/17/2015  . Hyponatremia 11/17/2015  . Hypokalemia 11/17/2015  . Acute kidney injury  superimposed on chronic kidney disease ([place]) 11/17/2015  . Restless legs syndrome 07/24/2015  . Mild cognitive impairment 01/08/2015  . Abnormality of gait 01/08/2015  . Dizziness 12/19/2014  . Preoperative cardiovascular examination 12/11/2013  . GERD (gastroesophageal reflux disease) 10/06/2013  . Nausea vomiting and diarrhea 10/06/2013  . Syncope 10/05/2013  . Dementia 12/06/2012  . Chest pain at rest 10/28/2012  . Anxiety 10/28/2012    Class: Acute  . Edema of both legs 10/28/2012  . CAD S/P percutaneous coronary angioplasty   . Essential hypertension   . Dyslipidemia, goal LDL below 70    Past Medical History:  Diagnosis Date  . Anxiety   . Arthritis     osteoarthritis  . CAD S/P percutaneous coronary angioplasty 2007; March 2011   [facility] 3.0 mm 20 mm postdilated 3.6 mm in early mid LAD; status post ISR Cutting Balloon PTCA and March 11 along with PCI of distal mid lesion with a 3.0 mm 12 mm MultiLink vision BMS; the proximal stent causes jailing of SP1 and SP2 with ostial 70-80% lesions  . Cancer (HCC)    Melanoma, Squamous cell Carcinoma  . CHF (congestive heart failure) ([place])   . Chronic kidney disease   . Dementia    mild  . Diabetes mellitus type 2 with neurological manifestations ([place])   . Dyslipidemia, goal LDL below 70   .  Full dentures   . GERD (gastroesophageal reflux disease)   . Headache(784.0)   . History of hematuria    Followed by Dr. Gaynelle Arabian  . Hypertension, essential, benign   . Nausea & vomiting 10/2015  . Peripheral neuropathy   . TIA (transient ischemic attack)    multiple in the past  . Wears glasses     Family History  Problem Relation Age of Onset  . Diabetes Mother   . Hypertension Mother   . Kidney disease Mother   . Hypertension Father   . Emphysema Father   . Heart attack Father   . Heart disease Father   . Arthritis Sister   . Diabetes Sister   . Hypertension Sister   . Hypertension Brother   . Hyperlipidemia Brother    . Diabetes Brother   . Stroke Brother   . Diabetes Sister   . Hypertension Sister   . Cancer Sister     Cervical cancer  . Hypertension Brother   . Heart attack Daughter   . Hypertension Daughter   . COPD Daughter   . Cancer Daughter     Breast cancer  . Kidney disease Daughter     Kidney mass    Past Surgical History:  Procedure Laterality Date  . ABDOMINAL HYSTERECTOMY    . APPENDECTOMY    . BLADDER SUSPENSION    . CARDIAC CATHETERIZATION  02/24/2006   85% stenosis in the proximal portion of LAD-arrangements made for PCI on 02/25/2006  . CARDIAC CATHETERIZATION  02/25/2006   80% LAD lesion stented with a 3x42mm Liberte stent resulting in reduction of 80% lesion to 0% residual  . CARDIAC CATHETERIZATION  04/26/2006   Medical management  . CARDIAC CATHETERIZATION  06/24/2009   60-70% re-stenosis in the proximal LAD. A 3.25x15 cutting balloon, 3 inflations - 14atm-38sec, 13atm-39sec, and 12atm-40sec reduced to less than 10%. 60% stenosis of the mid/distal LAD stented with a 3x63mm Multilink stent.  Marland Kitchen CARDIAC CATHETERIZATION  07/09/2009   Medical management  . CAROTID DOPPLER  06/24/2009   40-59% R ICA stenosis. No significant ICA stenosis noted  . CHOLECYSTECTOMY    . DOPPLER ECHOCARDIOGRAPHY  12/31/2009/July 2015   IsLV size & function, Gr 1 DD w/ mild TR.;; b)09/2013: 55-60%. Grade 1 DD, mild aortic sclerosis  . JOINT REPLACEMENT Left   . LESION REMOVAL Right 03/11/2015   Procedure:  EXCISION OF RIGHT PRE TIBIAL LESION;  Surgeon: Judeth Horn, MD;  Location: [facility];  Service: General;  Laterality: Right;  . MASS EXCISION Left 06/10/2014   Procedure: EXCISION LEFT LOWER LEG LESION;  Surgeon: Doreen Salvage, MD;  Location: [facility];  Service: General;  Laterality: Left;  Marland Kitchen MASS EXCISION Left 09/05/2015   Procedure: EXCISION OF LEFT FOREARM  MASS;  Surgeon: Judeth Horn, MD;  Location: [facility];  Service: General;  Laterality: Left;  . NM  MYOVIEW LTD  11/16/2011   5 beats a PVC during recovery.  No skin or infarction.  Reached 5 METs, EKG negative for ischemia   . SHOULDER ARTHROSCOPY  10/15  . SHOULDER ARTHROSCOPY  1995   left  . TENDON REPAIR Left 05/12/2016   Procedure: Left Anterior Tibial Tendon Reconstruction;  Surgeon: Newt Minion, MD;  Location: [facility];  Service: Orthopedics;  Laterality: Left;  . TOTAL KNEE ARTHROPLASTY  2005   left  . WRIST ARTHROPLASTY  2010   cancer lt wrist   Social History   Occupational History  .  retired Retired   Social History Main Topics  . Smoking status: Never Smoker  . Smokeless tobacco: Never Used  . Alcohol use No  . Drug use: No  . Sexual activity: No

## 2016-07-15 ENCOUNTER — Other Ambulatory Visit: Payer: Self-pay | Admitting: Nurse Practitioner

## 2016-07-15 NOTE — Telephone Encounter (Signed)
°*  STAT* If patient is at the pharmacy, call can be transferred to refill team.   1. Which medications need to be refilled? (please list name of each medication and dose if known) Metoprolol 2. Which pharmacy/location (including street and city if local pharmacy) is medication to be sent to? [facility]  3. Do they need a 30 day or 90 day supply? 90 and refills

## 2016-07-16 MED ORDER — METOPROLOL SUCCINATE ER 50 MG PO TB24: ORAL_TABLET | ORAL | 0 refills | Status: DC

## 2016-07-16 NOTE — Telephone Encounter (Signed)
Rx(s) sent to pharmacy electronically.  

## 2016-07-18 ENCOUNTER — Other Ambulatory Visit: Payer: Self-pay | Admitting: Nurse Practitioner

## 2016-07-20 ENCOUNTER — Ambulatory Visit (INDEPENDENT_AMBULATORY_CARE_PROVIDER_SITE_OTHER): Payer: Medicare Other | Admitting: Cardiology

## 2016-07-20 ENCOUNTER — Encounter: Payer: Self-pay | Admitting: Cardiology

## 2016-07-20 VITALS — BP 112/71 | HR 83 | Ht 66.5 in | Wt 163.6 lb

## 2016-07-20 DIAGNOSIS — R55 Syncope and collapse: Secondary | ICD-10-CM

## 2016-07-20 DIAGNOSIS — Z9861 Coronary angioplasty status: Secondary | ICD-10-CM | POA: Diagnosis not present

## 2016-07-20 DIAGNOSIS — R600 Localized edema: Secondary | ICD-10-CM | POA: Diagnosis not present

## 2016-07-20 DIAGNOSIS — R42 Dizziness and giddiness: Secondary | ICD-10-CM

## 2016-07-20 DIAGNOSIS — I2510 Atherosclerotic heart disease of native coronary artery without angina pectoris: Secondary | ICD-10-CM

## 2016-07-20 DIAGNOSIS — I10 Essential (primary) hypertension: Secondary | ICD-10-CM | POA: Diagnosis not present

## 2016-07-20 DIAGNOSIS — E785 Hyperlipidemia, unspecified: Secondary | ICD-10-CM

## 2016-07-20 MED ORDER — METOPROLOL SUCCINATE ER 50 MG PO TB24: ORAL_TABLET | ORAL | 3 refills | Status: DC

## 2016-07-20 NOTE — Assessment & Plan Note (Signed)
Visit history of BMS PCI to the LAD and follow-up PTCA of in-stent restenosis back in 2011. She does not have any active anginal symptoms at this time. She had a relatively negative Myoview in 2013. She is on Plavix for prophylaxis. She takes losartan (in the a.m.) without the HCTZ now along with Toprol in the evening.. No longer taking the Imdur. Has not required nitroglycerin, however we will simply refill it.

## 2016-07-20 NOTE — Progress Notes (Signed)
PCP: Glo Herring, MD  Clinic Note: Chief Complaint  Patient presents with  . Follow-up    CAD, diastolic heart failure/dysfunction  . Dizziness    occasionally.unsteaded.     HPI: [name] is a 76 y.o. female with a PMH below who presents today for 18+ month f/u for CAD associated with dementia/memory loss.  History of BMS PCI to the LAD in 2006 -> postdilated (restenosis in 2011)  [name] was last seen on December 2016  Recent Hospitalizations:   Feb 2018 - ankle surgery --> followed by Shingles R arm / axilla  Aug 2017 - for GI illness  Studies Reviewed:   2-D echo August 2017: EF 55-60 %. Mild LVH. Mild pulmonary hypertension. Normal valves.   Interval History: [name] presents today for delayed follow-up and is actually been doing relatively well from a cardiac standpoint. She has had ankle surgery recently and the hospitalization back in the fall for found nausea it was thought to be a GI bug. She still intermittently has some of her dizzy spells but they're not that frequent, and she has not had any near syncope. They discussed her come out of the blue not associated with any rapid irregular heartbeats palpitations. She has not noted any chest tightness or pressure with rest or exertion, still has these fleeting episodes of dry catch her breath. She does notice any exertional dyspnea. Minimal lower extremity edema.  No real palpitations or syncope/near syncope. Only very brief dizziness.  No PND or orthopnea. No TIA/amaurosis fugax symptoms. No melena, hematochezia, hematuria, or epstaxis. No claudication.  ROS: A comprehensive was performed. Review of Systems  Constitutional: Negative for malaise/fatigue.  HENT: Positive for hearing loss. Negative for congestion and nosebleeds.   Respiratory: Positive for shortness of breath (Brief fleeting spells).   Cardiovascular:       Per history of present illness  Gastrointestinal: Negative for abdominal pain,  constipation and diarrhea.  Genitourinary: Negative for frequency and urgency.  Musculoskeletal: Positive for joint pain (Recovering from her left ankle surgery.).  Skin: Negative.   Neurological: Positive for dizziness (Same spells always). Negative for focal weakness, seizures and loss of consciousness.  Psychiatric/Behavioral: Negative for depression and memory loss. The patient is nervous/anxious.   All other systems reviewed and are negative.   Past Medical History:  Diagnosis Date  . Anxiety   . Arthritis     osteoarthritis  . CAD S/P percutaneous coronary angioplasty 2007; March 2011   [facility] 3.0 mm 20 mm postdilated 3.6 mm in early mid LAD; status post ISR Cutting Balloon PTCA and March 11 along with PCI of distal mid lesion with a 3.0 mm 12 mm MultiLink vision BMS; the proximal stent causes jailing of SP1 and SP2 with ostial 70-80% lesions  . Cancer (HCC)    Melanoma, Squamous cell Carcinoma  . CHF (congestive heart failure) ([place])   . Chronic kidney disease   . Dementia    mild  . Diabetes mellitus type 2 with neurological manifestations ([place])   . Dyslipidemia, goal LDL below 70   . Full dentures   . GERD (gastroesophageal reflux disease)   . Headache(784.0)   . History of hematuria    Followed by Dr. Gaynelle Arabian  . Hypertension, essential, benign   . Nausea & vomiting 10/2015  . Peripheral neuropathy   . TIA (transient ischemic attack)    multiple in the past  . Wears glasses     Past Surgical History:  Procedure Laterality Date  . ABDOMINAL HYSTERECTOMY    . APPENDECTOMY    . BLADDER SUSPENSION    . CARDIAC CATHETERIZATION  02/24/2006   85% stenosis in the proximal portion of LAD-arrangements made for PCI on 02/25/2006  . CARDIAC CATHETERIZATION  02/25/2006   80% LAD lesion stented with a 3x73mm [facility] stent resulting in reduction of 80% lesion to 0% residual  . CARDIAC CATHETERIZATION  04/26/2006   Medical management  . CARDIAC CATHETERIZATION   06/24/2009   60-70% re-stenosis in the proximal LAD. A 3.25x15 cutting balloon, 3 inflations - 14atm-38sec, 13atm-39sec, and 12atm-40sec reduced to less than 10%. 60% stenosis of the mid/distal LAD stented with a 3x70mm Multilink stent.  Marland Kitchen CARDIAC CATHETERIZATION  07/09/2009   Medical management  . CAROTID DOPPLER  06/24/2009   40-59% R ICA stenosis. No significant ICA stenosis noted  . CHOLECYSTECTOMY    . JOINT REPLACEMENT Left   . LESION REMOVAL Right 03/11/2015   Procedure:  EXCISION OF RIGHT PRE TIBIAL LESION;  Surgeon: Judeth Horn, MD;  Location: [place] [facility];  Service: General;  Laterality: Right;  . MASS EXCISION Left 06/10/2014   Procedure: EXCISION LEFT LOWER LEG LESION;  Surgeon: Doreen Salvage, MD;  Location: [facility];  Service: General;  Laterality: Left;  Marland Kitchen MASS EXCISION Left 09/05/2015   Procedure: EXCISION OF LEFT FOREARM  MASS;  Surgeon: Judeth Horn, MD;  Location: [facility];  Service: General;  Laterality: Left;  . NM MYOVIEW LTD  11/16/2011   5 beats a PVC during recovery.  No skin or infarction.  Reached 5 METs, EKG negative for ischemia   . SHOULDER ARTHROSCOPY  10/15  . SHOULDER ARTHROSCOPY  1995   left  . TENDON REPAIR Left 05/12/2016   Procedure: Left Anterior Tibial Tendon Reconstruction;  Surgeon: Newt Minion, MD;  Location: [facility];  Service: Orthopedics;  Laterality: Left;  . TOTAL KNEE ARTHROPLASTY  2005   left  . TRANSTHORACIC ECHOCARDIOGRAM  10/2015   EF 55-60 %. Mild LVH. Mild pulmonary hypertension. Normal valves.  . TRANSTHORACIC ECHOCARDIOGRAM  12/31/2009/July 2015   IsLV size & function, Gr 1 DD w/ mild TR.;; b)09/2013: 55-60%. Grade 1 DD, mild aortic sclerosis  . WRIST ARTHROPLASTY  2010   cancer lt wrist    Current Meds  Medication Sig  . acyclovir (ZOVIRAX) 800 MG tablet   . Biotin w/ Vitamins C & E (HAIR SKIN & NAILS GUMMIES PO) Take 2 tablets by mouth daily.   . bumetanide (BUMEX) 1 MG tablet Take 1.5 tablets  (1.5 mg total) by mouth daily. Please schedule appointment for refills. (Patient taking differently: Take 2 mg by mouth daily. Please schedule appointment for refills.)  . Calcium Carb-Cholecalciferol (CALCIUM 600 + D PO) Take 1 tablet by mouth at bedtime.   . Cholecalciferol ([employer] D3) 2000 units CAPS Take 2,000 Units by mouth every evening.  . clopidogrel (PLAVIX) 75 MG tablet Take 1 tablet (75 mg total) by mouth daily. NO FURTHER REFILL SWILL BE AUTHORIZED UNTIL APPT IS SCHEDULED [phone]  . COD LIVER OIL PO Take 415 mg by mouth daily.  Marland Kitchen CRANBERRY PO Take 1 capsule by mouth daily.  Marland Kitchen estrogens, conjugated, (PREMARIN) 0.3 MG tablet Take 0.3 mg by mouth at bedtime.   . Fish Oil-Krill Oil (KRILL OIL PLUS PO) Take 1 capsule by mouth daily.   Marland Kitchen gabapentin (NEURONTIN) 300 MG capsule Take 600 mg by mouth at bedtime.   . Iron-FA-B Cmp-C-Biot-Probiotic (FUSION  PLUS) CAPS takes 1 tablet daily  . losartan (COZAAR) 100 MG tablet take 1 tablet daily  . Melatonin 5 MG CAPS Take 5 mg by mouth at bedtime.  . metFORMIN (GLUCOPHAGE) 500 MG tablet Take 500 mg by mouth 2 (two) times daily with a meal.  . Methylfol-Methylcob-Acetylcyst (CEREFOLIN NAC) 6-2-600 MG TABS TAKE 1 CAPLET BY MOUTH ONCE DAILY.  . metoprolol succinate (TOPROL XL) 50 MG 24 hr tablet Take 0.5 tablet (25 mg total) by mouth daily.  . nitroGLYCERIN (NITROLINGUAL) 0.4 MG/SPRAY spray Place 1 spray under the tongue every 5 (five) minutes x 3 doses as needed for chest pain.   . Omega-3 Fatty Acids (FISH OIL) 1200 MG CAPS Take 1,200 mg by mouth daily.   . pantoprazole (PROTONIX) 40 MG tablet TAKE 1 TABLET DAILY (CONTACT OFFICE FOR ADDITIONAL REFILLS, FINAL ATTEMPT)  . pramipexole (MIRAPEX) 0.25 MG tablet Take .25 mg at 4pm and 2 tabs (0.5mg )at bedtime (Patient taking differently: Take 0.25-0.5 mg by mouth 3 (three) times daily. Take .25 mg at 4pm and 2 tabs (0.5mg )at bedtime)  . Resveratrol (RESERVAPAK PO) Take 1 tablet by mouth every evening.   .  rosuvastatin (CRESTOR) 20 MG tablet Take 20 mg by mouth at bedtime.  . [DISCONTINUED] LORazepam (ATIVAN) 0.5 MG tablet   . [DISCONTINUED] metoprolol succinate (TOPROL XL) 50 MG 24 hr tablet Take 0.5 tablet (25 mg total) by mouth daily. MUST KEEP APPOINTMENT 07/20/16 WITH DR Braxtyn Bojarski FOR FUTURE REFILLS  . [DISCONTINUED] ondansetron (ZOFRAN) 4 MG tablet   . [DISCONTINUED] oxyCODONE (OXY IR/ROXICODONE) 5 MG immediate release tablet Take 1-2 tablets (5-10 mg total) by mouth every 6 (six) hours as needed for moderate pain, severe pain or breakthrough pain.    Allergies  Allergen Reactions  . Lipitor [Atorvastatin] Other (See Comments)    Bone and muscle pain  . Nsaids Swelling  . Reglan [Metoclopramide]     Tardive dyskinesia; paradoxical reaction not relieved by benadryl  . Tylenol [Acetaminophen] Other (See Comments)    "anything with tylenol in it" patient has Stage II Kidney Failure  . Ultram [Tramadol] Other (See Comments)    Pt has seizure activity with this medication    Social History   Social History  . Marital status: Married    Spouse name: [name]  . Number of children: 3  . Years of education: 69   Occupational History  . retired Retired   Social History Main Topics  . Smoking status: Never Smoker  . Smokeless tobacco: Never Used  . Alcohol use No  . Drug use: No  . Sexual activity: No   Other Topics Concern  . None   Social History Narrative   She is a married mother of 85, grandmother of 2.  Usually very active and out about the house.  But currently over last 6-8 weeks has been incapacitated.  She never smoked and does not drink alcohol.    family history includes Arthritis in her sister; COPD in her daughter; Cancer in her daughter and sister; Diabetes in her brother, mother, sister, and sister; Emphysema in her father; Heart attack in her daughter and father; Heart disease in her father; Hyperlipidemia in her brother; Hypertension in her brother, brother,  daughter, father, mother, sister, and sister; Kidney disease in her daughter and mother; Stroke in her brother.  Wt Readings from Last 3 Encounters:  07/20/16 163 lb 9.6 oz (74.2 kg)  05/12/16 172 lb (78 kg)  05/06/16 172 lb 9.6 oz (78.3 kg)  PHYSICAL EXAM BP 112/71   Pulse 83   Ht 5' 6.5" (1.689 m)   Wt 163 lb 9.6 oz (74.2 kg)   BMI 26.01 kg/m  General appearance: alert, cooperative, appears stated age, no distress and Well-nourished, well-groomed HEENT: [place]/AT, EOMI, MMM, anicteric sclera Neck: no adenopathy, no carotid bruit and no JVD Lungs: clear to auscultation bilaterally, normal percussion bilaterally and non-labored Heart: regular rate and rhythm, S1& S2 normal, no murmur, click, rub or gallop; nondisplaced PMI Abdomen: soft, non-tender; bowel sounds normal; no masses,  no organomegaly; no HJR Extremities: extremities normal, atraumatic, no cyanosis, and edema - trace. Left ankle appears to be healing well. Pulses: 2+ and symmetric;  Skin: mobility and turgor normal, no evidence of bleeding or bruising, no lesions noted, temperature normal and vascularity normal Neurologic: Mental status: Alert, oriented, thought content appropriate; pleasant mood and affect    Adult ECG Report  Rate: 83 ;  Rhythm: normal sinus rhythm and Normal axis, intervals and durations;   Narrative Interpretation: Normal EKG   Other studies Reviewed: Additional studies/ records that were reviewed today include:  Recent Labs:  Na/ - followed by PCP.   ASSESSMENT / PLAN: Problem List Items Addressed This Visit    CAD S/P percutaneous coronary angioplasty - Primary (Chronic)    Visit history of BMS PCI to the LAD and follow-up PTCA of in-stent restenosis back in 2011. She does not have any active anginal symptoms at this time. She had a relatively negative Myoview in 2013. She is on Plavix for prophylaxis. She takes losartan (in the a.m.) without the HCTZ now along with Toprol in the  evening.. No longer taking the Imdur. Has not required nitroglycerin, however we will simply refill it.      Relevant Medications   metoprolol succinate (TOPROL XL) 50 MG 24 hr tablet   Other Relevant Orders   EKG 12-Lead   Dizziness (Chronic)   Relevant Orders   EKG 12-Lead   Dyslipidemia, goal LDL below 70 (Chronic)    She is monitored by her PCP. I have not seen any recent labs. She is on Crestor at a stable dose.      Relevant Medications   metoprolol succinate (TOPROL XL) 50 MG 24 hr tablet   Edema of both legs (Chronic)    Relatively well-controlled with Bumex and compression stockings.      Essential hypertension (Chronic)    Blood pressure looks pretty good on current medications. I think some of her spells are related to some orthostatic dizziness. If she has the spells that just recommend that she reduce her dose of losartan. She is a little low for her target blood pressure and lipid disease this morning. However she for target blood pressure range closer to 130-140 mmHg.      Relevant Medications   metoprolol succinate (TOPROL XL) 50 MG 24 hr tablet   Syncope (Chronic)    No further episodes. She still has some slight dizzy spells. As result, we reviewed., We will need to consider backing off on her Activities.      Relevant Medications   metoprolol succinate (TOPROL XL) 50 MG 24 hr tablet   Other Relevant Orders   EKG 12-Lead      Current medicines are reviewed at length with the patient today. (+/- concerns) None The following changes have been made: None  Patient Instructions   NO CHANGE WITH CURRENT MEDICATIONS   IF YOU HAVE ONE YOUR SPELLS - IT IS OKAY- TO TAKE  1/2 TABLET  OF LOSARTAN FOR 2-3 DAYS THEN RETURN TO WHOLE TABLET THEREAFTER.    Your physician wants you to follow-up in [place]. You will receive a reminder letter in the mail two months in advance. If you don't receive a letter, please call our office to schedule the  follow-up appointment.    If you need a refill on your cardiac medications before your next appointment, please call your pharmacy.    Studies Ordered:   Orders Placed This Encounter  Procedures  . EKG 12-Lead      Glenetta Hew, M.D., M.S. Interventional Cardiologist   Pager # [phone] Phone # [phone] [address] [place], [place] [address]

## 2016-07-20 NOTE — Assessment & Plan Note (Addendum)
She is monitored by her PCP. I have not seen any recent labs. She is on Crestor at a stable dose.

## 2016-07-20 NOTE — Assessment & Plan Note (Signed)
Relatively well-controlled with Bumex and compression stockings.

## 2016-07-20 NOTE — Assessment & Plan Note (Signed)
Blood pressure looks pretty good on current medications. I think some of her spells are related to some orthostatic dizziness. If she has the spells that just recommend that she reduce her dose of losartan. She is a little low for her target blood pressure and lipid disease this morning. However she for target blood pressure range closer to 130-140 mmHg.

## 2016-07-20 NOTE — Assessment & Plan Note (Signed)
No further episodes. She still has some slight dizzy spells. As result, we reviewed., We will need to consider backing off on her Activities.

## 2016-07-20 NOTE — Patient Instructions (Signed)
NO CHANGE WITH CURRENT MEDICATIONS   IF YOU HAVE ONE YOUR SPELLS - IT IS OKAY- TO TAKE 1/2 TABLET  OF LOSARTAN FOR 2-3 DAYS THEN RETURN TO WHOLE TABLET THEREAFTER.    Your physician wants you to follow-up in [place]. You will receive a reminder letter in the mail two months in advance. If you don't receive a letter, please call our office to schedule the follow-up appointment.    If you need a refill on your cardiac medications before your next appointment, please call your pharmacy.

## 2016-07-22 ENCOUNTER — Ambulatory Visit: Payer: Self-pay | Admitting: Neurology

## 2016-07-22 ENCOUNTER — Other Ambulatory Visit: Payer: Self-pay | Admitting: Nurse Practitioner

## 2016-07-22 MED ORDER — PRAMIPEXOLE DIHYDROCHLORIDE 0.25 MG PO TABS: ORAL_TABLET | ORAL | 5 refills | Status: DC

## 2016-07-23 ENCOUNTER — Other Ambulatory Visit: Payer: Self-pay | Admitting: Cardiology

## 2016-07-23 MED ORDER — CLOPIDOGREL BISULFATE 75 MG PO TABS: 75.0000 mg | ORAL_TABLET | Freq: Every day | ORAL | 3 refills | Status: DC

## 2016-07-23 NOTE — Telephone Encounter (Signed)
Rx(s) sent to pharmacy electronically.  

## 2016-07-23 NOTE — Telephone Encounter (Signed)
New Message     *STAT* If patient is at the pharmacy, call can be transferred to refill team.   1. Which medications need to be refilled? (please list name of each medication and dose if known) Plavix   2. Which pharmacy/location (including street and city if local pharmacy) is medication to be sent to [facility]   3. Do they need a 30 day or 90 day supply?  90 with 3 refill   Needs authorization sent over to them pt was just in 07/20/16

## 2016-08-03 HISTORY — PX: TRANSESOPHAGEAL ECHOCARDIOGRAM: SHX273

## 2016-08-06 ENCOUNTER — Other Ambulatory Visit: Payer: Self-pay | Admitting: *Deleted

## 2016-08-06 MED ORDER — PRAMIPEXOLE DIHYDROCHLORIDE 0.25 MG PO TABS: ORAL_TABLET | ORAL | 1 refills | Status: DC

## 2016-08-06 NOTE — Telephone Encounter (Signed)
Received fax for [facility]x [facility]

## 2016-08-12 DIAGNOSIS — D631 Anemia in chronic kidney disease: Secondary | ICD-10-CM | POA: Diagnosis not present

## 2016-08-12 DIAGNOSIS — N182 Chronic kidney disease, stage 2 (mild): Secondary | ICD-10-CM | POA: Diagnosis not present

## 2016-08-12 DIAGNOSIS — N2581 Secondary hyperparathyroidism of renal origin: Secondary | ICD-10-CM | POA: Diagnosis not present

## 2016-08-20 DIAGNOSIS — D631 Anemia in chronic kidney disease: Secondary | ICD-10-CM | POA: Diagnosis not present

## 2016-08-20 DIAGNOSIS — N182 Chronic kidney disease, stage 2 (mild): Secondary | ICD-10-CM | POA: Diagnosis not present

## 2016-08-20 DIAGNOSIS — I10 Essential (primary) hypertension: Secondary | ICD-10-CM | POA: Diagnosis not present

## 2016-08-20 DIAGNOSIS — N2581 Secondary hyperparathyroidism of renal origin: Secondary | ICD-10-CM | POA: Diagnosis not present

## 2016-08-31 DIAGNOSIS — M069 Rheumatoid arthritis, unspecified: Secondary | ICD-10-CM | POA: Diagnosis not present

## 2016-08-31 DIAGNOSIS — Z6826 Body mass index (BMI) 26.0-26.9, adult: Secondary | ICD-10-CM | POA: Diagnosis not present

## 2016-08-31 DIAGNOSIS — K219 Gastro-esophageal reflux disease without esophagitis: Secondary | ICD-10-CM | POA: Diagnosis not present

## 2016-08-31 DIAGNOSIS — E119 Type 2 diabetes mellitus without complications: Secondary | ICD-10-CM | POA: Diagnosis not present

## 2016-08-31 DIAGNOSIS — G894 Chronic pain syndrome: Secondary | ICD-10-CM | POA: Diagnosis not present

## 2016-08-31 DIAGNOSIS — I10 Essential (primary) hypertension: Secondary | ICD-10-CM | POA: Diagnosis not present

## 2016-08-31 DIAGNOSIS — B0229 Other postherpetic nervous system involvement: Secondary | ICD-10-CM | POA: Diagnosis not present

## 2016-09-01 ENCOUNTER — Other Ambulatory Visit: Payer: Self-pay | Admitting: Internal Medicine

## 2016-09-01 DIAGNOSIS — Z1231 Encounter for screening mammogram for malignant neoplasm of breast: Secondary | ICD-10-CM

## 2016-09-07 DIAGNOSIS — N182 Chronic kidney disease, stage 2 (mild): Secondary | ICD-10-CM | POA: Diagnosis not present

## 2016-09-10 ENCOUNTER — Ambulatory Visit (INDEPENDENT_AMBULATORY_CARE_PROVIDER_SITE_OTHER): Payer: Medicare Other | Admitting: Ophthalmology

## 2016-09-13 DIAGNOSIS — E1165 Type 2 diabetes mellitus with hyperglycemia: Secondary | ICD-10-CM | POA: Diagnosis not present

## 2016-09-13 DIAGNOSIS — E7800 Pure hypercholesterolemia, unspecified: Secondary | ICD-10-CM | POA: Diagnosis not present

## 2016-09-20 DIAGNOSIS — E7800 Pure hypercholesterolemia, unspecified: Secondary | ICD-10-CM | POA: Diagnosis not present

## 2016-09-20 DIAGNOSIS — I10 Essential (primary) hypertension: Secondary | ICD-10-CM | POA: Diagnosis not present

## 2016-09-20 DIAGNOSIS — G609 Hereditary and idiopathic neuropathy, unspecified: Secondary | ICD-10-CM | POA: Diagnosis not present

## 2016-09-20 DIAGNOSIS — E1165 Type 2 diabetes mellitus with hyperglycemia: Secondary | ICD-10-CM | POA: Diagnosis not present

## 2016-10-12 ENCOUNTER — Ambulatory Visit
Admission: RE | Admit: 2016-10-12 | Discharge: 2016-10-12 | Disposition: A | Discharge status: Home / Self Care | Payer: Medicare Other | Source: Ambulatory Visit | Attending: Internal Medicine | Admitting: Internal Medicine

## 2016-10-12 DIAGNOSIS — Z1231 Encounter for screening mammogram for malignant neoplasm of breast: Secondary | ICD-10-CM

## 2016-10-13 ENCOUNTER — Other Ambulatory Visit: Payer: Self-pay | Admitting: Internal Medicine

## 2016-10-13 DIAGNOSIS — R928 Other abnormal and inconclusive findings on diagnostic imaging of breast: Secondary | ICD-10-CM

## 2016-10-13 DIAGNOSIS — N312 Flaccid neuropathic bladder, not elsewhere classified: Secondary | ICD-10-CM | POA: Diagnosis not present

## 2016-10-20 ENCOUNTER — Ambulatory Visit
Admission: RE | Admit: 2016-10-20 | Discharge: 2016-10-20 | Disposition: A | Discharge status: Home / Self Care | Payer: Medicare Other | Source: Ambulatory Visit | Attending: Internal Medicine | Admitting: Internal Medicine

## 2016-10-20 ENCOUNTER — Other Ambulatory Visit: Payer: Self-pay | Admitting: Internal Medicine

## 2016-10-20 ENCOUNTER — Ambulatory Visit: Payer: Medicare Other

## 2016-10-20 DIAGNOSIS — R928 Other abnormal and inconclusive findings on diagnostic imaging of breast: Secondary | ICD-10-CM

## 2016-10-20 DIAGNOSIS — N6310 Unspecified lump in the right breast, unspecified quadrant: Secondary | ICD-10-CM

## 2016-10-20 DIAGNOSIS — C50411 Malignant neoplasm of upper-outer quadrant of right female breast: Secondary | ICD-10-CM | POA: Diagnosis not present

## 2016-10-20 DIAGNOSIS — N6311 Unspecified lump in the right breast, upper outer quadrant: Secondary | ICD-10-CM | POA: Diagnosis not present

## 2016-10-20 DIAGNOSIS — R922 Inconclusive mammogram: Secondary | ICD-10-CM | POA: Diagnosis not present

## 2016-10-28 ENCOUNTER — Other Ambulatory Visit: Payer: Self-pay | Admitting: Surgery

## 2016-10-28 DIAGNOSIS — Z170 Estrogen receptor positive status [ER+]: Principal | ICD-10-CM

## 2016-10-28 DIAGNOSIS — C50911 Malignant neoplasm of unspecified site of right female breast: Secondary | ICD-10-CM

## 2016-10-28 DIAGNOSIS — Z7901 Long term (current) use of anticoagulants: Secondary | ICD-10-CM | POA: Diagnosis not present

## 2016-10-29 ENCOUNTER — Telehealth: Payer: Self-pay | Admitting: Cardiology

## 2016-10-29 NOTE — Telephone Encounter (Signed)
° °  [facility] Pre-operative Risk Assessment    Request for surgical clearance:  1. What type of surgery is being performed? "breast cancer surgery"  2. When is this surgery scheduled? Not yet   3. Are there any medications that need to be held prior to surgery and how long? Hold Plavix for 5 days prior  4. Name of physician performing surgery? Dr. Alphonsa Overall  5. What is your office phone and fax number? Office [phone] // fax # [phone]  Patient also would like follow-up call  [name] 10/29/2016, 9:36 AM  _________________________________________________________________   (provider comments below)

## 2016-11-01 ENCOUNTER — Ambulatory Visit
Admission: RE | Admit: 2016-11-01 | Discharge: 2016-11-01 | Disposition: A | Discharge status: Home / Self Care | Payer: Medicare Other | Source: Ambulatory Visit | Attending: Surgery | Admitting: Surgery

## 2016-11-01 DIAGNOSIS — Z853 Personal history of malignant neoplasm of breast: Secondary | ICD-10-CM | POA: Diagnosis not present

## 2016-11-01 DIAGNOSIS — Z170 Estrogen receptor positive status [ER+]: Principal | ICD-10-CM

## 2016-11-01 DIAGNOSIS — C50911 Malignant neoplasm of unspecified site of right female breast: Secondary | ICD-10-CM

## 2016-11-01 MED ORDER — GADOBENATE DIMEGLUMINE 529 MG/ML IV SOLN
8.0000 mL | Freq: Once | INTRAVENOUS | Status: AC | PRN
  Administered 2016-11-01: 8 mL via INTRAVENOUS

## 2016-11-01 NOTE — Telephone Encounter (Signed)
Clearance routed to MD in-basket & via [facility] fax

## 2016-11-01 NOTE — Telephone Encounter (Signed)
Reviewed on paper chart. Patient does have history of TIA, but no active coronary disease or heart failure disease. She has diabetes, nondominant normal renal function. Breast surgery would be a low risk surgery. She would be a low risk patient for low risk surgery. Would be okay to hold Plavix and aspirin 5-7 days pre-op.  Glenetta Hew, MD

## 2016-11-04 ENCOUNTER — Other Ambulatory Visit (HOSPITAL_COMMUNITY): Payer: Self-pay | Admitting: Internal Medicine

## 2016-11-04 ENCOUNTER — Ambulatory Visit (HOSPITAL_COMMUNITY)
Admission: RE | Admit: 2016-11-04 | Discharge: 2016-11-04 | Disposition: A | Discharge status: Home / Self Care | Payer: Medicare Other | Source: Ambulatory Visit | Attending: Internal Medicine | Admitting: Internal Medicine

## 2016-11-04 ENCOUNTER — Encounter: Payer: Self-pay | Admitting: Radiation Oncology

## 2016-11-04 ENCOUNTER — Other Ambulatory Visit: Payer: Self-pay | Admitting: Surgery

## 2016-11-04 DIAGNOSIS — L0390 Cellulitis, unspecified: Secondary | ICD-10-CM | POA: Diagnosis not present

## 2016-11-04 DIAGNOSIS — R928 Other abnormal and inconclusive findings on diagnostic imaging of breast: Secondary | ICD-10-CM

## 2016-11-04 DIAGNOSIS — E1140 Type 2 diabetes mellitus with diabetic neuropathy, unspecified: Secondary | ICD-10-CM | POA: Diagnosis not present

## 2016-11-04 DIAGNOSIS — Z6826 Body mass index (BMI) 26.0-26.9, adult: Secondary | ICD-10-CM | POA: Diagnosis not present

## 2016-11-04 DIAGNOSIS — L97521 Non-pressure chronic ulcer of other part of left foot limited to breakdown of skin: Secondary | ICD-10-CM | POA: Insufficient documentation

## 2016-11-04 DIAGNOSIS — I10 Essential (primary) hypertension: Secondary | ICD-10-CM | POA: Diagnosis not present

## 2016-11-04 DIAGNOSIS — K219 Gastro-esophageal reflux disease without esophagitis: Secondary | ICD-10-CM | POA: Diagnosis not present

## 2016-11-04 DIAGNOSIS — M7989 Other specified soft tissue disorders: Secondary | ICD-10-CM | POA: Diagnosis not present

## 2016-11-04 DIAGNOSIS — L03116 Cellulitis of left lower limb: Secondary | ICD-10-CM | POA: Diagnosis not present

## 2016-11-04 DIAGNOSIS — N183 Chronic kidney disease, stage 3 (moderate): Secondary | ICD-10-CM | POA: Diagnosis not present

## 2016-11-08 ENCOUNTER — Encounter (INDEPENDENT_AMBULATORY_CARE_PROVIDER_SITE_OTHER): Payer: Self-pay | Admitting: Family

## 2016-11-08 ENCOUNTER — Ambulatory Visit (INDEPENDENT_AMBULATORY_CARE_PROVIDER_SITE_OTHER): Payer: Medicare Other | Admitting: Family

## 2016-11-08 VITALS — Ht 66.0 in | Wt 163.0 lb

## 2016-11-08 DIAGNOSIS — Z981 Arthrodesis status: Secondary | ICD-10-CM | POA: Diagnosis not present

## 2016-11-08 DIAGNOSIS — M7989 Other specified soft tissue disorders: Secondary | ICD-10-CM

## 2016-11-08 DIAGNOSIS — M79672 Pain in left foot: Secondary | ICD-10-CM | POA: Diagnosis not present

## 2016-11-08 DIAGNOSIS — I2510 Atherosclerotic heart disease of native coronary artery without angina pectoris: Secondary | ICD-10-CM

## 2016-11-08 DIAGNOSIS — Z9861 Coronary angioplasty status: Secondary | ICD-10-CM

## 2016-11-08 NOTE — Progress Notes (Addendum)
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 Microscopic Comment The carcinoma appears grade 1. Prognostic markers will be ordered. Dr. Lyndon Code has reviewed the case. The case was called to [facility] on 10/21/2016.  Receptor Status: ER(100%), PR (100%), Her2-neu (negative), Ki-(5%)  Did patient present with symptoms (if so, please note symptoms) or was this found on screening mammography?: screening mammogram  Past/Anticipated interventions by surgeon, if any:Dr. Misty Stanley  Past/Anticipated interventions by medical oncology, if any: Chemotherapy   Lymphedema issues, if any:  None Pain issues, if any:  Pain under her right arm Swelling in left foot.  SAFETY ISSUES:  Prior radiation? no  Pacemaker/ICD? no  Is the patient on methotrexate? no  Current Complaints / other details:  Patient has a family history of breast cancer in her sister and daughter.  She will have an MRI   Biopsy on 11/11/16. Sister and daughter with breast cancer Patient states that she has 2 stainless stints in her heart.  Allergies:Lipitor,NSAIDS,,Reglan,Ultram

## 2016-11-08 NOTE — Progress Notes (Signed)
Office Visit Note   Patient: [name]           Date of Birth: 1940-09-06           MRN: [id number] Visit Date: 11/08/2016              Requested by: [facility] [address], [place], [place] [id number] PCP: [facility], MD  Chief Complaint  Patient presents with  . Left Ankle - Follow-up    Ankle pain and tendon tear      HPI: The patient is 76 year old woman who is seen today for evaluation of swelling and pain to her left foot. She is status post fusion of the left ankle.   Last week at the end of the week noticed swelling 7 onset to her foot this is painful it was red and hot she went to the emergency department in [place] radiographs of her left foot are concerning for osteomyelitis. She was prescribed ciprofloxacin as well as clindamycin  Of note she has a recent breast cancer diagnosis and is to begin having treatment later this week.  Assessment & Plan: Visit Diagnoses:  1. Pain in left foot   2. H/O ankle fusion   3. Foot swelling     Plan: We'll follow-up in office following MRI of the left foot to rule out osteomyelitis. Complete Cipro and clindamycin as ordered.  Follow-Up Instructions: Return in about 1 week (around 11/15/2016) for mri review.   Ortho Exam  Patient is alert, oriented, no adenopathy, well-dressed, normal affect, normal respiratory effort. On examination of the left foot she has moderate swelling this is pitting. There is no redness today no cellulitis no open ulcers no drainage. Is tender along the fifth metatarsal shaft and base.  Imaging: No results found. No images are attached to the encounter.  Labs: Lab Results  Component Value Date   HGBA1C 5.8 (H) 05/06/2016   HGBA1C 6.0 (H) 11/17/2015   HGBA1C 5.9 (H) 10/05/2013   REPTSTATUS 11/18/2015 FINAL 11/17/2015   CULT (A) 11/17/2015    50,000 COLONIES/mL LACTOBACILLUS SPECIES Standardized susceptibility testing for this organism is not available.    [place] 12/31/2009    Orders:  Orders Placed This Encounter  Procedures  . MR Foot Left w/o contrast   No orders of the defined types were placed in this encounter.    Procedures: No procedures performed  Clinical Data: No additional findings.  ROS:  All other systems negative, except as noted in the HPI. Review of Systems  Constitutional: Negative for chills and fever.  Cardiovascular: Positive for leg swelling.  Musculoskeletal: Positive for arthralgias and joint swelling.  Skin: Negative for color change and wound.    Objective: Vital Signs: Ht 5\' 6"  (1.676 m)   Wt 163 lb (73.9 kg)   BMI 26.31 kg/m   Specialty Comments:  No specialty comments available.  PMFS History: Patient Active Problem List   Diagnosis Date Noted  . Tendon tear, ankle, left, sequela   . Traumatic rupture of left anterior tibial tendon 04/20/2016  . Acute on chronic kidney failure ([place])   . 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 ([place])   . Headache, migraine   . Unexplained weight loss 11/17/2015  . Acute kidney injury superimposed on chronic kidney disease ([place]) 11/17/2015  . Restless legs syndrome 07/24/2015  . Mild cognitive impairment 01/08/2015  . Abnormality of gait 01/08/2015  . Dizziness 12/19/2014  . Preoperative cardiovascular examination 12/11/2013  .  GERD (gastroesophageal reflux disease) 10/06/2013  . Syncope 10/05/2013  . Dementia 12/06/2012  . Chest pain at rest 10/28/2012  . Anxiety 10/28/2012    Class: Acute  . Edema of both legs 10/28/2012  . CAD S/P percutaneous coronary angioplasty   . Essential hypertension   . Dyslipidemia, goal LDL below 70    Past Medical History:  Diagnosis Date  . Anxiety   . Arthritis     osteoarthritis  . CAD S/P percutaneous coronary angioplasty 2007; March 2011   [facility] 3.0 mm 20 mm postdilated 3.6 mm in early mid LAD; status post ISR Cutting Balloon PTCA and March 11 along with PCI of distal mid  lesion with a 3.0 mm 12 mm MultiLink vision BMS; the proximal stent causes jailing of SP1 and SP2 with ostial 70-80% lesions  . Cancer (HCC)    Melanoma, Squamous cell Carcinoma  . CHF (congestive heart failure) ([place])   . Chronic kidney disease   . Dementia    mild  . Diabetes mellitus type 2 with neurological manifestations ([place])   . Dyslipidemia, goal LDL below 70   . Full dentures   . GERD (gastroesophageal reflux disease)   . Headache(784.0)   . History of hematuria    Followed by Dr. Gaynelle Arabian  . Hypertension, essential, benign   . Nausea & vomiting 10/2015  . Peripheral neuropathy   . TIA (transient ischemic attack)    multiple in the past  . Wears glasses     Family History  Problem Relation Age of Onset  . Diabetes Mother   . Hypertension Mother   . Kidney disease Mother   . Hypertension Father   . Emphysema Father   . Heart attack Father   . Heart disease Father   . Arthritis Sister   . Diabetes Sister   . Hypertension Sister   . Breast cancer Sister   . Hypertension Brother   . Hyperlipidemia Brother   . Diabetes Brother   . Stroke Brother   . Diabetes Sister   . Hypertension Sister   . Cancer Sister        Cervical cancer  . Hypertension Brother   . Heart attack Daughter   . Hypertension Daughter   . COPD Daughter   . Breast cancer Daughter   . Cancer Daughter        Breast cancer  . Kidney disease Daughter        Kidney mass    Past Surgical History:  Procedure Laterality Date  . ABDOMINAL HYSTERECTOMY    . APPENDECTOMY    . BLADDER SUSPENSION    . CARDIAC CATHETERIZATION  02/24/2006   85% stenosis in the proximal portion of LAD-arrangements made for PCI on 02/25/2006  . CARDIAC CATHETERIZATION  02/25/2006   80% LAD lesion stented with a 3x60mm [facility] stent resulting in reduction of 80% lesion to 0% residual  . CARDIAC CATHETERIZATION  04/26/2006   Medical management  . CARDIAC CATHETERIZATION  06/24/2009   60-70% re-stenosis in the proximal  LAD. A 3.25x15 cutting balloon, 3 inflations - 14atm-38sec, 13atm-39sec, and 12atm-40sec reduced to less than 10%. 60% stenosis of the mid/distal LAD stented with a 3x68mm Multilink stent.  [facility] Kitchen CARDIAC CATHETERIZATION  07/09/2009   Medical management  . CAROTID DOPPLER  06/24/2009   40-59% R ICA stenosis. No significant ICA stenosis noted  . CHOLECYSTECTOMY    . JOINT REPLACEMENT Left   . LESION REMOVAL Right 03/11/2015   Procedure:  EXCISION OF RIGHT  PRE TIBIAL LESION;  Surgeon: Judeth Horn, MD;  Location: [facility];  Service: General;  Laterality: Right;  . MASS EXCISION Left 06/10/2014   Procedure: EXCISION LEFT LOWER LEG LESION;  Surgeon: Doreen Salvage, MD;  Location: [facility];  Service: General;  Laterality: Left;  [facility] Kitchen MASS EXCISION Left 09/05/2015   Procedure: EXCISION OF LEFT FOREARM  MASS;  Surgeon: Judeth Horn, MD;  Location: [facility];  Service: General;  Laterality: Left;  . NM MYOVIEW LTD  11/16/2011   5 beats a PVC during recovery.  No skin or infarction.  Reached 5 METs, EKG negative for ischemia   . SHOULDER ARTHROSCOPY  10/15  . SHOULDER ARTHROSCOPY  1995   left  . TENDON REPAIR Left 05/12/2016   Procedure: Left Anterior Tibial Tendon Reconstruction;  Surgeon: Newt Minion, MD;  Location: [facility];  Service: Orthopedics;  Laterality: Left;  . TOTAL KNEE ARTHROPLASTY  2005   left  . TRANSTHORACIC ECHOCARDIOGRAM  10/2015   EF 55-60 %. Mild LVH. Mild pulmonary hypertension. Normal valves.  . TRANSTHORACIC ECHOCARDIOGRAM  12/31/2009/July 2015   IsLV size & function, Gr 1 DD w/ mild TR.;; b)09/2013: 55-60%. Grade 1 DD, mild aortic sclerosis  . WRIST ARTHROPLASTY  2010   cancer lt wrist   Social History   Occupational History  . retired Retired   Social History Main Topics  . Smoking status: Never Smoker  . Smokeless tobacco: Never Used  . Alcohol use No  . Drug use: No  . Sexual activity: No

## 2016-11-09 ENCOUNTER — Encounter: Payer: Self-pay | Admitting: Radiation Oncology

## 2016-11-09 ENCOUNTER — Telehealth: Payer: Self-pay | Admitting: Hematology

## 2016-11-09 ENCOUNTER — Ambulatory Visit: Payer: Medicare Other | Admitting: Neurology

## 2016-11-09 ENCOUNTER — Ambulatory Visit
Admission: RE | Admit: 2016-11-09 | Discharge: 2016-11-09 | Disposition: A | Discharge status: Home / Self Care | Payer: Medicare Other | Source: Ambulatory Visit | Attending: Radiation Oncology | Admitting: Radiation Oncology

## 2016-11-09 ENCOUNTER — Encounter: Payer: Self-pay | Admitting: Hematology

## 2016-11-09 VITALS — BP 163/96 | HR 72 | Temp 97.9°F | Resp 20 | Wt 167.2 lb

## 2016-11-09 DIAGNOSIS — C50211 Malignant neoplasm of upper-inner quadrant of right female breast: Secondary | ICD-10-CM | POA: Diagnosis not present

## 2016-11-09 DIAGNOSIS — Z809 Family history of malignant neoplasm, unspecified: Secondary | ICD-10-CM

## 2016-11-09 DIAGNOSIS — B0229 Other postherpetic nervous system involvement: Secondary | ICD-10-CM

## 2016-11-09 DIAGNOSIS — Z803 Family history of malignant neoplasm of breast: Secondary | ICD-10-CM | POA: Insufficient documentation

## 2016-11-09 DIAGNOSIS — N6320 Unspecified lump in the left breast, unspecified quadrant: Secondary | ICD-10-CM | POA: Diagnosis not present

## 2016-11-09 DIAGNOSIS — Z510 Encounter for antineoplastic radiation therapy: Secondary | ICD-10-CM | POA: Insufficient documentation

## 2016-11-09 DIAGNOSIS — C50412 Malignant neoplasm of upper-outer quadrant of left female breast: Secondary | ICD-10-CM | POA: Insufficient documentation

## 2016-11-09 DIAGNOSIS — Z170 Estrogen receptor positive status [ER+]: Secondary | ICD-10-CM | POA: Diagnosis not present

## 2016-11-09 DIAGNOSIS — IMO0001 Reserved for inherently not codable concepts without codable children: Secondary | ICD-10-CM

## 2016-11-09 DIAGNOSIS — T814XXS Infection following a procedure, sequela: Secondary | ICD-10-CM

## 2016-11-09 NOTE — Progress Notes (Signed)
Please see the Nurse Progress Note in the MD Initial Consult Encounter for this patient. 

## 2016-11-09 NOTE — Progress Notes (Signed)
Radiation Oncology         [phone] ________________________________  Name: [name]        MRN: [id number]  Date of Service: 11/09/2016 DOB: May 15, 1940  CC:Redmond School, MD  Alphonsa Overall, MD     REFERRING PHYSICIAN: Alphonsa Overall, MD   DIAGNOSIS: The encounter diagnosis was Malignant 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, estrogen receptor positive ([place]).   HISTORY OF PRESENT ILLNESS: [name] is a 76 y.o. female seen at the request of Dr. Lucia Gaskins for a new diagnosis of right breast cancer. The patient was seen for screening mammography on 10/12/16 which revealed a possible distortion of the right breast. Diagnostic mammogram and ultrasound on 10/20/16 were performed. At 11:30, a lesion was noted measuring 9 x 9 x 8 mm with internal vascularity, and the right axilla was negative for adenopathy. A biopsy that day revealed a grade 1, ER/PR positive HER2 negative, Ki67 5% invasive ductal carcinoma. She also underwent MRI of bilateral breasts on  11/01/16 revealing a 15 x 8 x 11 mm lesion in the site of her known malignancy, as well as a 6 mm lesion in the upper outer quadrant of the left breast. She has met with Dr. Lucia Gaskins and has plans to proceed with a left breast biopsy on Thursday, and most likely a lumpectomy and sentinel node assessment. She's not been set up yet with medical oncology.     PREVIOUS RADIATION THERAPY: No   PAST MEDICAL HISTORY:  Past Medical History:  Diagnosis Date  . Anxiety   . Arthritis     osteoarthritis  . CAD S/P percutaneous coronary angioplasty 2007; March 2011   [employer] 3.0 mm 20 mm postdilated 3.6 mm in early mid LAD; status post ISR Cutting Balloon PTCA and March 11 along with PCI of distal mid lesion with a 3.0 mm 12 mm MultiLink vision BMS; the proximal stent causes jailing of SP1 and SP2 with ostial 70-80% lesions  . Cancer (HCC)    Melanoma, Squamous cell Carcinoma  . CHF (congestive heart failure) ([place])   .  Chronic kidney disease   . Dementia    mild  . Diabetes mellitus type 2 with neurological manifestations ([place])   . Dyslipidemia, goal LDL below 70   . Full dentures   . GERD (gastroesophageal reflux disease)   . Headache(784.0)   . History of hematuria    Followed by Dr. Gaynelle Arabian  . Hypertension, essential, benign   . Nausea & vomiting 10/2015  . Peripheral neuropathy   . TIA (transient ischemic attack)    multiple in the past  . Wears glasses        PAST SURGICAL HISTORY: Past Surgical History:  Procedure Laterality Date  . ABDOMINAL HYSTERECTOMY    . APPENDECTOMY    . BLADDER SUSPENSION    . CARDIAC CATHETERIZATION  02/24/2006   85% stenosis in the proximal portion of LAD-arrangements made for PCI on 02/25/2006  . CARDIAC CATHETERIZATION  02/25/2006   80% LAD lesion stented with a 3x71m Liberte stent resulting in reduction of 80% lesion to 0% residual  . CARDIAC CATHETERIZATION  04/26/2006   Medical management  . CARDIAC CATHETERIZATION  06/24/2009   60-70% re-stenosis in the proximal LAD. A 3.25x15 cutting balloon, 3 inflations - 14atm-38sec, 13atm-39sec, and 12atm-40sec reduced to less than 10%. 60% stenosis of the mid/distal LAD stented with a 3x14mMultilink stent.  . Marland KitchenARDIAC CATHETERIZATION  07/09/2009   Medical management  .  CAROTID DOPPLER  06/24/2009   40-59% R ICA stenosis. No significant ICA stenosis noted  . CHOLECYSTECTOMY    . JOINT REPLACEMENT Left   . LESION REMOVAL Right 03/11/2015   Procedure:  EXCISION OF RIGHT PRE TIBIAL LESION;  Surgeon: Jimmye Norman, MD;  Location: [facility];  Service: General;  Laterality: Right;  . MASS EXCISION Left 06/10/2014   Procedure: EXCISION LEFT LOWER LEG LESION;  Surgeon: Frederik Schmidt, MD;  Location: [facility];  Service: General;  Laterality: Left;  Marland Kitchen MASS EXCISION Left 09/05/2015   Procedure: EXCISION OF LEFT FOREARM  MASS;  Surgeon: Jimmye Norman, MD;  Location: [facility];   Service: General;  Laterality: Left;  . NM MYOVIEW LTD  11/16/2011   5 beats a PVC during recovery.  No skin or infarction.  Reached 5 METs, EKG negative for ischemia   . SHOULDER ARTHROSCOPY  10/15  . SHOULDER ARTHROSCOPY  1995   left  . TENDON REPAIR Left 05/12/2016   Procedure: Left Anterior Tibial Tendon Reconstruction;  Surgeon: Nadara Mustard, MD;  Location: [facility];  Service: Orthopedics;  Laterality: Left;  . TOTAL KNEE ARTHROPLASTY  2005   left  . TRANSTHORACIC ECHOCARDIOGRAM  10/2015   EF 55-60 %. Mild LVH. Mild pulmonary hypertension. Normal valves.  . TRANSTHORACIC ECHOCARDIOGRAM  12/31/2009/July 2015   IsLV size & function, Gr 1 DD w/ mild TR.;; b)09/2013: 55-60%. Grade 1 DD, mild aortic sclerosis  . WRIST ARTHROPLASTY  2010   cancer lt wrist     FAMILY HISTORY:  Family History  Problem Relation Age of Onset  . Diabetes Mother   . Hypertension Mother   . Kidney disease Mother   . Hypertension Father   . Emphysema Father   . Heart attack Father   . Heart disease Father   . Arthritis Sister   . Diabetes Sister   . Hypertension Sister   . Breast cancer Sister   . Hypertension Brother   . Hyperlipidemia Brother   . Diabetes Brother   . Stroke Brother   . Diabetes Sister   . Hypertension Sister   . Cervical cancer Sister   . Hypertension Brother   . Heart attack Daughter   . Hypertension Daughter   . COPD Daughter   . Breast cancer Daughter   . Kidney disease Daughter        Kidney mass  . Breast cancer Daughter      SOCIAL HISTORY:  reports that she has never smoked. She has never used smokeless tobacco. She reports that she does not drink alcohol or use drugs.  The patient lives in [place]. She is married.   ALLERGIES: Lipitor [atorvastatin]; Nsaids; Reglan [metoclopramide]; Tylenol [acetaminophen]; and Ultram [tramadol]   MEDICATIONS:  Current Outpatient Prescriptions  Medication Sig Dispense Refill  . Biotin w/ Vitamins C & E (HAIR SKIN & NAILS  GUMMIES PO) Take 2 tablets by mouth daily.     . bumetanide (BUMEX) 1 MG tablet Take 1.5 tablets (1.5 mg total) by mouth daily. Please schedule appointment for refills. (Patient taking differently: Take 2 mg by mouth daily. Please schedule appointment for refills.) 135 tablet 0  . Calcium Carb-Cholecalciferol (CALCIUM 600 + D PO) Take 1 tablet by mouth at bedtime.     . Cholecalciferol ([employer] D3) 2000 units CAPS Take 2,000 Units by mouth every evening.    . COD LIVER OIL PO Take 415 mg by mouth daily.    Marland Kitchen CRANBERRY  PO Take 1 capsule by mouth daily.    . Fish Oil-Krill Oil (KRILL OIL PLUS PO) Take 1 capsule by mouth daily.     Marland Kitchen gabapentin (NEURONTIN) 300 MG capsule Take 600 mg by mouth at bedtime.     . Iron-FA-B Cmp-C-Biot-Probiotic (FUSION PLUS) CAPS takes 1 tablet daily  9  . losartan (COZAAR) 100 MG tablet take 1 tablet daily  3  . Melatonin 5 MG CAPS Take 5 mg by mouth at bedtime.    . Methylfol-Methylcob-Acetylcyst (CEREFOLIN NAC) 6-2-600 MG TABS TAKE 1 CAPLET BY MOUTH ONCE DAILY. 90 each 1  . metoprolol succinate (TOPROL XL) 50 MG 24 hr tablet Take 0.5 tablet (25 mg total) by mouth daily. 45 tablet 3  . nitroGLYCERIN (NITROLINGUAL) 0.4 MG/SPRAY spray Place 1 spray under the tongue every 5 (five) minutes x 3 doses as needed for chest pain.     . pantoprazole (PROTONIX) 40 MG tablet TAKE 1 TABLET DAILY (CONTACT OFFICE FOR ADDITIONAL REFILLS, FINAL ATTEMPT) 90 tablet 0  . pramipexole (MIRAPEX) 0.25 MG tablet Take .25 mg at 4pm and 2 tabs (0.'5mg'$ )at bedtime 270 tablet 1  . Resveratrol (RESERVAPAK PO) Take 1 tablet by mouth every evening.     . rosuvastatin (CRESTOR) 20 MG tablet Take 20 mg by mouth at bedtime.    . clopidogrel (PLAVIX) 75 MG tablet Take 1 tablet (75 mg total) by mouth daily. (Patient not taking: Reported on 11/09/2016) 90 tablet 3   No current facility-administered medications for this encounter.      REVIEW OF SYSTEMS: On review of systems, the patient reports that she is  doing well overall. She has been emotionally labile since stopping estrogen therapy. She reports she has significant pain with right axillary pain as a result of postherpetic neuralgia. She denies any chest pain, shortness of breath, cough, fevers, chills, night sweats, unintended weight changes. She denies any bowel or bladder disturbances, and denies abdominal pain, nausea or vomiting. She's in the process of recovering from cellulitis of her left foot following surgery that was performed in February. She denies any new musculoskeletal or joint aches or pains. A complete review of systems is obtained and is otherwise negative.     PHYSICAL EXAM:  Wt Readings from Last 3 Encounters:  11/09/16 167 lb 4 oz (75.9 kg)  11/08/16 163 lb (73.9 kg)  07/20/16 163 lb 9.6 oz (74.2 kg)   Temp Readings from Last 3 Encounters:  11/09/16 97.9 F (36.6 C) (Oral)  05/12/16 97 F (36.1 C)  05/06/16 97.7 F (36.5 C)   BP Readings from Last 3 Encounters:  11/09/16 (!) 163/96  07/20/16 112/71  05/12/16 (!) 145/87   Pulse Readings from Last 3 Encounters:  11/09/16 72  07/20/16 83  05/12/16 88   Pain Assessment Pain Score: 5  Pain Loc:  (underarm)/10  In general this is a well appearing caucasian female in no acute distress. She is alert and oriented x4 and appropriate throughout the examination. HEENT reveals that the patient is normocephalic, atraumatic. EOMs are intact. PERRLA. Skin is intact without any evidence of gross lesions. Cardiovascular exam reveals a regular rate and rhythm, no clicks rubs or murmurs are auscultated. Chest is clear to auscultation bilaterally. Lymphatic assessment is performed and does not reveal any adenopathy in the cervical, supraclavicular, axillary, or inguinal chains. Bilateral breast exam is performed and she has tenderness to palpation of the later chest wall along the contour of the upper outer quadrant of the right breast.  No palpable masses are noted. A seborrheic  keratosis is seen in the right breast inferior to the nipple. No palpable masses are noted in the left breast. No nipple bleeding or discharge is noted. Abdomen has active bowel sounds in all quadrants and is intact. The abdomen is soft, non tender, non distended. Lower extremities are negative for pretibial pitting edema, deep calf tenderness, cyanosis or clubbing. The left ankle is notable for resolving cellulitis. The skin is not hot to touch, no punctate changes are noted. The erythema is not as bright as the patient describes this to previously be.   ECOG =1  0 - Asymptomatic (Fully active, able to carry on all predisease activities without restriction)  1 - Symptomatic but completely ambulatory (Restricted in physically strenuous activity but ambulatory and able to carry out work of a light or sedentary nature. For example, light housework, office work)  2 - Symptomatic, <50% in bed during the day (Ambulatory and capable of all self care but unable to carry out any work activities. Up and about more than 50% of waking hours)  3 - Symptomatic, >50% in bed, but not bedbound (Capable of only limited self-care, confined to bed or chair 50% or more of waking hours)  4 - Bedbound (Completely disabled. Cannot carry on any self-care. Totally confined to bed or chair)  5 - Death   Eustace Pen MM, Creech RH, Tormey DC, et al. [phone]). "Toxicity and response criteria of the [facility]". [place] Oncol. 5 (6): 649-55    LABORATORY DATA:  Lab Results  Component Value Date   WBC 8.9 05/06/2016   HGB 12.5 05/06/2016   HCT 39.0 05/06/2016   MCV 90.7 05/06/2016   PLT 196 05/06/2016   Lab Results  Component Value Date   NA 137 05/06/2016   K 5.1 05/06/2016   CL 100 (L) 05/06/2016   CO2 25 05/06/2016   Lab Results  Component Value Date   ALT 12 (L) 11/16/2015   AST 22 11/16/2015   ALKPHOS 80 11/16/2015   BILITOT 0.7 11/16/2015      RADIOGRAPHY: Mr Breast Bilateral  W Wo Contrast  Result Date: 11/01/2016 CLINICAL DATA:  76 year old female with history of recently diagnosed grade 1 invasive ductal carcinoma of the right breast at 11:30. The patient has family history of breast cancer in her sister at age 79 and her daughter at age 43. LABS:  Creatinine was obtained on site at [facility] at [address] Results: Creatinine 1.4 mg/dL with GFR of 36. EXAM: BILATERAL BREAST MRI WITH AND WITHOUT CONTRAST TECHNIQUE: Multiplanar, multisequence MR images of both breasts were obtained prior to and following the intravenous administration of 8 ml of MultiHance. THREE-DIMENSIONAL MR IMAGE RENDERING ON INDEPENDENT WORKSTATION: Three-dimensional MR images were rendered by post-processing of the original MR data on an independent workstation. The three-dimensional MR images were interpreted, and findings are reported in the following complete MRI report for this study. Three dimensional images were evaluated at the independent DynaCad workstation COMPARISON:  Previous exam(s). FINDINGS: Breast composition: c. Heterogeneous fibroglandular tissue. Background parenchymal enhancement: Mild. Right breast: In the upper-outer quadrant of the right breast, middle depth there is an enhancing mass with internal susceptibility indicating the biopsy marking clip from the patient's recent biopsy. This biopsied known malignancy measures 1.5 x 0.8 x 1.1 cm. Left breast: In the upper-outer quadrant of the left breast, middle to posterior depth there is a 6 mm enhancing mass. Lymph nodes: No  abnormal appearing lymph nodes. Ancillary findings:  None. IMPRESSION: 1. There is a 1.5 cm biopsy proven malignancy in the upper slightly outer quadrant of the right breast. No additional sites of disease are identified. 2. There is an indeterminate 6 mm enhancing mass in the upper-outer left breast. RECOMMENDATION: MRI guided biopsy is recommended for the left breast mass. BI-RADS CATEGORY  4:  Suspicious. Electronically Signed   By: Ammie Ferrier M.D.   On: 11/01/2016 16:27   Dg Foot Complete Left  Result Date: 11/04/2016 CLINICAL DATA:  Puncture wound under the great toe with swelling and redness EXAM: LEFT FOOT - COMPLETE 3+ VIEW COMPARISON:  MRI of the left foot of 02/23/2016 FINDINGS: On the frontal view there is cortical erosion and focal demineralization of the distal portion of the proximal phalanx of the left great toe, very suspicious for active osteomyelitis with adjacent soft tissue swelling. No other acute abnormality is seen. Joint spaces appear normal for age. IMPRESSION: There is cortical erosion and focal demineralization of the distal portion of the proximal phalanx of the left great toe very suspicious for osteomyelitis. Electronically Signed   By: Ivar Drape M.D.   On: 11/04/2016 17:14   US Breast Ltd Uni Right Inc Axilla  Result Date: 10/20/2016 CLINICAL DATA:  Patient returns after screening study for evaluation of possible right breast distortion. The patient's daughter and sister were diagnosed with breast cancer. EXAM: 2D DIGITAL DIAGNOSTIC RIGHT MAMMOGRAM WITH CAD AND ADJUNCT TOMO ULTRASOUND RIGHT BREAST COMPARISON:  10/12/2016 and earlier ACR Breast Density Category c: The breast tissue is heterogeneously dense, which may obscure small masses. FINDINGS: Additional 2-D and 3-D images are performed. These images demonstrate persistent distortion in the upper-outer quadrant of the right breast. Mammographic images were processed with CAD. On physical exam, I palpate soft focal thickening in the 11 o'clock location of the right breast. I palpate no axillary adenopathy. Targeted ultrasound is performed, showing an irregular hypoechoic mass with posterior acoustic shadowing in the 11:30 o'clock location of the right breast 4 cm from the nipple which measures 0.9 x 0.9 x 0.8 cm. There is associated internal vascularity. Evaluation of the axilla is negative for adenopathy.  IMPRESSION: 1. Persistent area of distortion corresponds to a vaguely palpable irregular mass and is suspicious for malignancy. 2. No adenopathy by ultrasound. RECOMMENDATION: Ultrasound-guided core biopsy of mass in the 11:30 o'clock location of the right breast. I have discussed the findings and recommendations with the patient. Results were also provided in writing at the conclusion of the visit. If applicable, a reminder letter will be sent to the patient regarding the next appointment. BI-RADS CATEGORY  4: Suspicious. Electronically Signed   By: Nolon Nations M.D.   On: 10/20/2016 09:00   Mm Diag Breast Tomo Uni Right  Result Date: 10/20/2016 CLINICAL DATA:  Patient returns after screening study for evaluation of possible right breast distortion. The patient's daughter and sister were diagnosed with breast cancer. EXAM: 2D DIGITAL DIAGNOSTIC RIGHT MAMMOGRAM WITH CAD AND ADJUNCT TOMO ULTRASOUND RIGHT BREAST COMPARISON:  10/12/2016 and earlier ACR Breast Density Category c: The breast tissue is heterogeneously dense, which may obscure small masses. FINDINGS: Additional 2-D and 3-D images are performed. These images demonstrate persistent distortion in the upper-outer quadrant of the right breast. Mammographic images were processed with CAD. On physical exam, I palpate soft focal thickening in the 11 o'clock location of the right breast. I palpate no axillary adenopathy. Targeted ultrasound is performed, showing an irregular hypoechoic mass  with posterior acoustic shadowing in the 11:30 o'clock location of the right breast 4 cm from the nipple which measures 0.9 x 0.9 x 0.8 cm. There is associated internal vascularity. Evaluation of the axilla is negative for adenopathy. IMPRESSION: 1. Persistent area of distortion corresponds to a vaguely palpable irregular mass and is suspicious for malignancy. 2. No adenopathy by ultrasound. RECOMMENDATION: Ultrasound-guided core biopsy of mass in the 11:30 o'clock  location of the right breast. I have discussed the findings and recommendations with the patient. Results were also provided in writing at the conclusion of the visit. If applicable, a reminder letter will be sent to the patient regarding the next appointment. BI-RADS CATEGORY  4: Suspicious. Electronically Signed   By: Nolon Nations M.D.   On: 10/20/2016 09:00   Mm Screening Breast Tomo Bilateral  Result Date: 10/12/2016 CLINICAL DATA:  Screening. EXAM: 2D DIGITAL SCREENING BILATERAL MAMMOGRAM WITH CAD AND ADJUNCT TOMO COMPARISON:  Previous exam(s). ACR Breast Density Category b: There are scattered areas of fibroglandular density. FINDINGS: In the right breast, possible distortion warrants further evaluation. In the left breast, no findings suspicious for malignancy. Images were processed with CAD. IMPRESSION: Further evaluation is suggested for possible distortion in the right breast. RECOMMENDATION: Diagnostic mammogram and possibly ultrasound of the right breast. (Code:[id number]) The patient will be contacted regarding the findings, and additional imaging will be scheduled. BI-RADS CATEGORY  0: Incomplete. Need additional imaging evaluation and/or prior mammograms for comparison. Electronically Signed   By: Marin Olp M.D.   On: 10/12/2016 16:41   Mm Clip Placement Right  Result Date: 10/20/2016 CLINICAL DATA:  Post ultrasound-guided core needle biopsy of right breast 11:30 o'clock mass. EXAM: DIAGNOSTIC RIGHT MAMMOGRAM POST ULTRASOUND BIOPSY COMPARISON:  Previous exam(s). FINDINGS: Mammographic images were obtained following ultrasound guided biopsy of right breast 11:30 o'clock mass. Two-view mammography demonstrates presence of ribbon shaped marker within the biopsy site in appropriate radiographic position. IMPRESSION: Successful placement of ribbon shaped marker within the right breast, post ultrasound-guided core needle biopsy. Final Assessment: Post Procedure Mammograms for Marker Placement  Electronically Signed   By: Fidela Salisbury M.D.   On: 10/20/2016 10:27   [place] Rt Breast Bx W Loc Dev 1st Lesion Img Bx Spec US Guide  Addendum Date: 10/21/2016   ADDENDUM REPORT: 10/21/2016 13:56 ADDENDUM: Pathology revealed GRADE I INVASIVE DUCTAL CARCINOMA of the Right breast, 11:30 o'clock. This was found to be concordant by Dr. Fidela Salisbury. Pathology results were discussed with the patient by telephone. The patient reported doing well after the biopsy with tenderness at the site. Post biopsy instructions and care were reviewed and questions were answered. The patient was encouraged to call [facility] for any additional concerns. Surgical consultation has been arranged with Dr. Alphonsa Overall at [facility], per patient request, on October 28, 2016. Pathology results reported by Terie Purser, RN on 10/21/2016. Electronically Signed   By: Fidela Salisbury M.D.   On: 10/21/2016 13:56   Result Date: 10/21/2016 CLINICAL DATA:  Right breast 11 o'clock mass. EXAM: ULTRASOUND GUIDED RIGHT BREAST CORE NEEDLE BIOPSY COMPARISON:  Previous exam(s). FINDINGS: I met with the patient and we discussed the procedure of ultrasound-guided biopsy, including benefits and alternatives. We discussed the high likelihood of a successful procedure. We discussed the risks of the procedure, including infection, bleeding, tissue injury, clip migration, and inadequate sampling. Informed written consent was given. The usual time-out protocol was performed immediately prior to the procedure. Lesion quadrant: Upper  outer quadrant. Using sterile technique and 1% Lidocaine as local anesthetic, under direct ultrasound visualization, a 14 gauge spring-loaded device was used to perform biopsy of right breast 11 o'clock mass using a medial approach. At the conclusion of the procedure a ribbon shaped tissue marker clip was deployed into the biopsy cavity. Follow up 2 view mammogram was performed  and dictated separately. IMPRESSION: Ultrasound guided biopsy of right breast. No apparent complications. Electronically Signed: By: Fidela Salisbury M.D. On: 10/20/2016 10:14       IMPRESSION/PLAN: 1. Stage IA, [id number], ER/PR positive, grade 1, invasive ductal carcinoma of the right breast with left breast mass also noted on diagnostic imaging. Dr. Lisbeth Renshaw discusses the pathology findings and reviews the nature of invasive breast disease. Recommendations are to proceed with biopsy of the left breast prior to making plans for right lumpectomy with sentinel mapping. She will meet with medical oncology as well. Provided that chemotherapy is not indicated, the patient's course would then be followed by external radiotherapy to the breast followed by antiestrogen therapy. We discussed the risks, benefits, short, and long term effects of radiotherapy, and the patient is interested in proceeding. Dr. Lisbeth Renshaw discusses the delivery and logistics of radiotherapy. He would offer a course of 4 or 6 1/2 weeks and this will depend on her final pathology findings. We will see her back about 2 weeks after surgery to move forward with the simulation and planning process and anticipate starting radiotherapy about 4 weeks after surgery.  2. Possible genetic predisposition to malignancy. We discussed her family history and reviewed the options for referral to genetics. She is interested in a referral to discuss options of testing. 3. Right postherpetic neuralgia.  She continues Neurontin and will follow up with her PCP for this. 4. Delayed postop cellulitis in the left lower extremity. The patient is recovering from this with oral antibiotics and will follow up with her orthopedic surgeon.  The above documentation reflects my direct findings during this shared patient visit. Please see the separate note by Dr. Lisbeth Renshaw on this date for the remainder of the patient's plan of care.    Carola Rhine, PAC

## 2016-11-09 NOTE — Telephone Encounter (Signed)
Appt has been scheduled for the pt to see [name] on 8/23 at 11am. Letter mailed to the pt

## 2016-11-10 ENCOUNTER — Ambulatory Visit
Admission: RE | Admit: 2016-11-10 | Discharge: 2016-11-10 | Disposition: A | Discharge status: Home / Self Care | Payer: Medicare Other | Source: Ambulatory Visit | Attending: Family | Admitting: Family

## 2016-11-10 ENCOUNTER — Telehealth: Payer: Self-pay | Admitting: *Deleted

## 2016-11-10 ENCOUNTER — Other Ambulatory Visit: Payer: Medicare Other

## 2016-11-10 DIAGNOSIS — M79672 Pain in left foot: Secondary | ICD-10-CM

## 2016-11-10 DIAGNOSIS — M7989 Other specified soft tissue disorders: Secondary | ICD-10-CM | POA: Diagnosis not present

## 2016-11-10 NOTE — Telephone Encounter (Signed)
CALLED PATIENT TO INFORM OF GENETICS APPT. WITH ANNA VILLA ON 11-16-16 @ 11 AM AND HER APPT. WITH DR. FENG ON 11-18-16 @ 11 AM, SPOKE WITH PATIENT AND SHE IS AWARE OF THESE APPTS.

## 2016-11-11 ENCOUNTER — Ambulatory Visit
Admission: RE | Admit: 2016-11-11 | Discharge: 2016-11-11 | Disposition: A | Discharge status: Home / Self Care | Payer: Medicare Other | Source: Ambulatory Visit | Attending: Surgery | Admitting: Surgery

## 2016-11-11 ENCOUNTER — Other Ambulatory Visit (INDEPENDENT_AMBULATORY_CARE_PROVIDER_SITE_OTHER): Payer: Self-pay | Admitting: Family

## 2016-11-11 ENCOUNTER — Ambulatory Visit (INDEPENDENT_AMBULATORY_CARE_PROVIDER_SITE_OTHER): Payer: Medicare Other | Admitting: Family

## 2016-11-11 DIAGNOSIS — M79672 Pain in left foot: Secondary | ICD-10-CM

## 2016-11-11 DIAGNOSIS — R928 Other abnormal and inconclusive findings on diagnostic imaging of breast: Secondary | ICD-10-CM

## 2016-11-11 DIAGNOSIS — C50412 Malignant neoplasm of upper-outer quadrant of left female breast: Secondary | ICD-10-CM | POA: Diagnosis not present

## 2016-11-11 DIAGNOSIS — N6321 Unspecified lump in the left breast, upper outer quadrant: Secondary | ICD-10-CM | POA: Diagnosis not present

## 2016-11-11 DIAGNOSIS — M25572 Pain in left ankle and joints of left foot: Secondary | ICD-10-CM

## 2016-11-11 MED ORDER — GADOBENATE DIMEGLUMINE 529 MG/ML IV SOLN
8.0000 mL | Freq: Once | INTRAVENOUS | Status: AC | PRN
  Administered 2016-11-11: 8 mL via INTRAVENOUS

## 2016-11-12 ENCOUNTER — Other Ambulatory Visit (INDEPENDENT_AMBULATORY_CARE_PROVIDER_SITE_OTHER): Payer: Self-pay | Admitting: Family

## 2016-11-12 LAB — URIC ACID: Uric Acid, Serum: 7.9 mg/dL — ABNORMAL HIGH (ref 2.5–7.0)

## 2016-11-12 MED ORDER — ALLOPURINOL 100 MG PO TABS: 100.0000 mg | ORAL_TABLET | Freq: Every day | ORAL | 3 refills | Status: DC

## 2016-11-13 ENCOUNTER — Emergency Department (HOSPITAL_COMMUNITY)
Admission: EM | Admit: 2016-11-13 | Discharge: 2016-11-13 | Disposition: A | Discharge status: Home / Self Care | Payer: Medicare Other | Attending: Emergency Medicine | Admitting: Emergency Medicine

## 2016-11-13 ENCOUNTER — Encounter (HOSPITAL_COMMUNITY): Payer: Self-pay

## 2016-11-13 DIAGNOSIS — T7840XA Allergy, unspecified, initial encounter: Secondary | ICD-10-CM | POA: Diagnosis not present

## 2016-11-13 DIAGNOSIS — R21 Rash and other nonspecific skin eruption: Secondary | ICD-10-CM

## 2016-11-13 LAB — COMPREHENSIVE METABOLIC PANEL
ALT: 19 U/L (ref 14–54)
AST: 25 U/L (ref 15–41)
Albumin: 3.8 g/dL (ref 3.5–5.0)
Alkaline Phosphatase: 68 U/L (ref 38–126)
Anion gap: 9 (ref 5–15)
BUN: 23 mg/dL — ABNORMAL HIGH (ref 6–20)
CO2: 25 mmol/L (ref 22–32)
Calcium: 9.7 mg/dL (ref 8.9–10.3)
Chloride: 103 mmol/L (ref 101–111)
Creatinine, Ser: 1.87 mg/dL — ABNORMAL HIGH (ref 0.44–1.00)
GFR calc Af Amer: 29 mL/min — ABNORMAL LOW (ref >60)
GFR calc non Af Amer: 25 mL/min — ABNORMAL LOW (ref >60)
Glucose, Bld: 111 mg/dL — ABNORMAL HIGH (ref 65–99)
Potassium: 4.3 mmol/L (ref 3.5–5.1)
Sodium: 137 mmol/L (ref 135–145)
Total Bilirubin: 0.3 mg/dL (ref 0.3–1.2)
Total Protein: 7.1 g/dL (ref 6.5–8.1)

## 2016-11-13 LAB — CBC WITH DIFFERENTIAL/PLATELET
Basophils Absolute: 0 10*3/uL (ref 0.0–0.1)
Basophils Relative: 0 %
Eosinophils Absolute: 0.3 10*3/uL (ref 0.0–0.7)
Eosinophils Relative: 3 %
HCT: 35.6 % — ABNORMAL LOW (ref 36.0–46.0)
Hemoglobin: 12.2 g/dL (ref 12.0–15.0)
Lymphocytes Relative: 24 %
Lymphs Abs: 2.2 10*3/uL (ref 0.7–4.0)
MCH: 30.8 pg (ref 26.0–34.0)
MCHC: 34.3 g/dL (ref 30.0–36.0)
MCV: 89.9 fL (ref 78.0–100.0)
Monocytes Absolute: 0.6 10*3/uL (ref 0.1–1.0)
Monocytes Relative: 7 %
Neutro Abs: 5.9 10*3/uL (ref 1.7–7.7)
Neutrophils Relative %: 66 %
Platelets: 242 10*3/uL (ref 150–400)
RBC: 3.96 MIL/uL (ref 3.87–5.11)
RDW: 14.1 % (ref 11.5–15.5)
WBC: 9 10*3/uL (ref 4.0–10.5)

## 2016-11-13 LAB — I-STAT TROPONIN, ED: Troponin i, poc: 0 ng/mL (ref 0.00–0.08)

## 2016-11-13 MED ORDER — METHYLPREDNISOLONE SODIUM SUCC 125 MG IJ SOLR
125.0000 mg | Freq: Once | INTRAMUSCULAR | Status: AC
  Administered 2016-11-13: 125 mg via INTRAVENOUS
  Filled 2016-11-13: qty 2

## 2016-11-13 MED ORDER — DIPHENHYDRAMINE HCL 50 MG/ML IJ SOLN
25.0000 mg | Freq: Once | INTRAMUSCULAR | Status: AC
  Administered 2016-11-13: 25 mg via INTRAVENOUS
  Filled 2016-11-13: qty 1

## 2016-11-13 MED ORDER — DIPHENHYDRAMINE HCL 25 MG PO TABS: 25.0000 mg | ORAL_TABLET | Freq: Four times a day (QID) | ORAL | 0 refills | Status: DC

## 2016-11-13 MED ORDER — FAMOTIDINE IN NACL 20-0.9 MG/50ML-% IV SOLN
20.0000 mg | Freq: Once | INTRAVENOUS | Status: AC
  Administered 2016-11-13: 20 mg via INTRAVENOUS
  Filled 2016-11-13: qty 50

## 2016-11-13 MED ORDER — SODIUM CHLORIDE 0.9 % IV BOLUS (SEPSIS)
1000.0000 mL | Freq: Once | INTRAVENOUS | Status: AC
  Administered 2016-11-13: 1000 mL via INTRAVENOUS

## 2016-11-13 MED ORDER — PREDNISONE 50 MG PO TABS: 50.0000 mg | ORAL_TABLET | Freq: Every day | ORAL | 0 refills | Status: DC

## 2016-11-13 NOTE — Discharge Instructions (Signed)
Medications: Prednisone, Benadryl  Treatment: Take prednisone as prescribed for 5 days. This medication will most likely elevate your blood sugar. Take Benadryl every 6 hours for the next 3-4 days.  Follow-up: Please follow-up with your primary care provider in 2-3 days if your symptoms are not improving. Please return to the emergency department if you develop any new or worsening symptoms including swelling of your lips, tongue, throat, shortness of breath, intractable vomiting, or any other concerning symptoms.

## 2016-11-13 NOTE — ED Triage Notes (Addendum)
Pt states having an MRI on 11/11/16. Pt states rash broke out yesterday, and has spread over body. Pt recently diagnosed with breast cancer. Pt reports itchiness. Pt denies shortness of breath, dyspnea. Pt denies pain

## 2016-11-13 NOTE — ED Provider Notes (Signed)
[facility] DEPT Provider Note   CSN: [id number] Arrival date & time: 11/13/16  1821     History   Chief Complaint Chief Complaint  Patient presents with  . Rash    HPI [name] is a 76 y.o. female with history of CVA, CHF, diabetes, GERD, recent history of breast cancer who presents with rash. Patient had 2 contrasted MRIs in the past 2 days for evaluation and biopsy of breast masses. Patient developed rash yesterday just around her breast and has spread throughout her body. The rash is itchy. She reports taking Benadryl without relief. Patient also reports a sensation in her esophagus since eating strawberries over a cake 3 days ago that cause her to have a pain across her chest. Patient along her has chest pain, but has the sensation that when she swallows her that is not going the right place and has trouble getting following down. She has had to have esophageal stretching in the past. Patient denies any fevers, chest pain, shortness of breath, abdominal pain, nausea, vomiting, urinary symptoms, change in bowel movements, swelling of her lips, tongue, throat.  HPI  Past Medical History:  Diagnosis Date  . Anxiety   . Arthritis     osteoarthritis  . CAD S/P percutaneous coronary angioplasty 2007; March 2011   [employer] 3.0 mm 20 mm postdilated 3.6 mm in early mid LAD; status post ISR Cutting Balloon PTCA and March 11 along with PCI of distal mid lesion with a 3.0 mm 12 mm MultiLink vision BMS; the proximal stent causes jailing of SP1 and SP2 with ostial 70-80% lesions  . Cancer (HCC)    Melanoma, Squamous cell Carcinoma  . CHF (congestive heart failure) ([place])   . Chronic kidney disease   . Dementia    mild  . Diabetes mellitus type 2 with neurological manifestations ([place])   . Dyslipidemia, goal LDL below 70   . Full dentures   . GERD (gastroesophageal reflux disease)   . Headache(784.0)   . History of hematuria    Followed by Dr. Gaynelle Arabian  . Hypertension,  essential, benign   . Nausea & vomiting 10/2015  . Peripheral neuropathy   . TIA (transient ischemic attack)    multiple in the past  . Wears glasses     Patient Active Problem List   Diagnosis Date Noted  . Malignant 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, estrogen receptor positive ([place]) 11/09/2016  . Tendon tear, ankle, left, sequela   . Traumatic rupture of left anterior tibial tendon 04/20/2016  . Acute on chronic kidney failure ([place])   . 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 ([place])   . Headache, migraine   . Unexplained weight loss 11/17/2015  . Acute kidney injury superimposed on chronic kidney disease ([place]) 11/17/2015  . Restless legs syndrome 07/24/2015  . Mild cognitive impairment 01/08/2015  . Abnormality of gait 01/08/2015  . Dizziness 12/19/2014  . Preoperative cardiovascular examination 12/11/2013  . GERD (gastroesophageal reflux disease) 10/06/2013  . Syncope 10/05/2013  . Dementia 12/06/2012  . Chest pain at rest 10/28/2012  . Anxiety 10/28/2012    Class: Acute  . Edema of both legs 10/28/2012  . CAD S/P percutaneous coronary angioplasty   . Essential hypertension   . Dyslipidemia, goal LDL below 70     Past Surgical History:  Procedure Laterality Date  . ABDOMINAL HYSTERECTOMY    . APPENDECTOMY    . BLADDER SUSPENSION    . CARDIAC CATHETERIZATION  02/24/2006   85% stenosis in the proximal portion of LAD-arrangements made for PCI on 02/25/2006  . CARDIAC CATHETERIZATION  02/25/2006   80% LAD lesion stented with a 3x65mm [employer] stent resulting in reduction of 80% lesion to 0% residual  . CARDIAC CATHETERIZATION  04/26/2006   Medical management  . CARDIAC CATHETERIZATION  06/24/2009   60-70% re-stenosis in the proximal LAD. A 3.25x15 cutting balloon, 3 inflations - 14atm-38sec, 13atm-39sec, and 12atm-40sec reduced to less than 10%. 60% stenosis of the mid/distal LAD stented with a 3x32mm Multilink stent.  Marland Kitchen CARDIAC  CATHETERIZATION  07/09/2009   Medical management  . CAROTID DOPPLER  06/24/2009   40-59% R ICA stenosis. No significant ICA stenosis noted  . CHOLECYSTECTOMY    . JOINT REPLACEMENT Left   . LESION REMOVAL Right 03/11/2015   Procedure:  EXCISION OF RIGHT PRE TIBIAL LESION;  Surgeon: Judeth Horn, MD;  Location: [facility];  Service: General;  Laterality: Right;  . MASS EXCISION Left 06/10/2014   Procedure: EXCISION LEFT LOWER LEG LESION;  Surgeon: Doreen Salvage, MD;  Location: [facility];  Service: General;  Laterality: Left;  Marland Kitchen MASS EXCISION Left 09/05/2015   Procedure: EXCISION OF LEFT FOREARM  MASS;  Surgeon: Judeth Horn, MD;  [facility];  Service: General;  Laterality: Left;  . NM MYOVIEW LTD  11/16/2011   5 beats a PVC during recovery.  No skin or infarction.  Reached 5 METs, EKG negative for ischemia   . SHOULDER ARTHROSCOPY  10/15  . SHOULDER ARTHROSCOPY  1995   left  . TENDON REPAIR Left 05/12/2016   Procedure: Left Anterior Tibial Tendon Reconstruction;  Surgeon: Newt Minion, MD;  Location: [facility];  Service: Orthopedics;  Laterality: Left;  . TOTAL KNEE ARTHROPLASTY  2005   left  . TRANSTHORACIC ECHOCARDIOGRAM  10/2015   EF 55-60 %. Mild LVH. Mild pulmonary hypertension. Normal valves.  . TRANSTHORACIC ECHOCARDIOGRAM  12/31/2009/July 2015   IsLV size & function, Gr 1 DD w/ mild TR.;; b)09/2013: 55-60%. Grade 1 DD, mild aortic sclerosis  . WRIST ARTHROPLASTY  2010   cancer lt wrist    OB History    No data available       Home Medications    Prior to Admission medications   Medication Sig Start Date End Date Taking? Authorizing Provider  Biotin w/ Vitamins C & E (HAIR SKIN & NAILS GUMMIES PO) Take 2 tablets by mouth daily.    Yes [provider]  bumetanide (BUMEX) 2 MG tablet Take 2 mg by mouth daily. 08/26/16  Yes [provider]  busPIRone (BUSPAR) 7.5 MG tablet Take 7.5 mg by mouth 2 (two) times daily.  11/05/16  Yes [provider]  Calcium Carb-Cholecalciferol (CALCIUM 600 + D PO) Take 1 tablet by mouth at bedtime.    Yes [provider]  Cholecalciferol 2000 units CAPS Take 2,000 Units by mouth daily.   Yes [provider]  COD LIVER OIL PO Take 415 mg by mouth daily.   Yes [provider]  CRANBERRY PO Take 1 capsule by mouth daily.   Yes [provider]  diphenhydrAMINE (BENADRYL) 50 MG tablet Take 50 mg by mouth at bedtime as needed for itching.   Yes [provider]  Fish Oil-Krill Oil (KRILL OIL PLUS PO) Take 1 capsule by mouth daily.    Yes [provider]  gabapentin (NEURONTIN) 300 MG capsule Take 600 mg by mouth at bedtime.  Yes [provider]  Iron-FA-B Cmp-C-Biot-Probiotic (FUSION PLUS) CAPS takes 1 tablet daily 01/08/16  Yes [provider]  losartan (COZAAR) 100 MG tablet take 1 tablet daily 12/16/15  Yes [provider]  Melatonin 5 MG CAPS Take 5 mg by mouth at bedtime.   Yes [provider]  Methylfol-Methylcob-Acetylcyst (CEREFOLIN NAC) 6-2-600 MG TABS TAKE 1 CAPLET BY MOUTH ONCE DAILY. 07/15/16  Yes Garvin Fila, MD  metoprolol succinate (TOPROL XL) 50 MG 24 hr tablet Take 0.5 tablet (25 mg total) by mouth daily. 07/20/16  Yes Leonie Man, MD  pantoprazole (PROTONIX) 40 MG tablet TAKE 1 TABLET DAILY (CONTACT OFFICE FOR ADDITIONAL REFILLS, FINAL ATTEMPT) 02/02/16  Yes Leonie Man, MD  pramipexole (MIRAPEX) 0.25 MG tablet Take .25 mg at 4pm and 2 tabs (0.5mg )at bedtime 08/06/16  Yes Dennie Bible, NP  Resveratrol (RESERVAPAK PO) Take 1 tablet by mouth every evening.    Yes [provider]  rosuvastatin (CRESTOR) 20 MG tablet Take 20 mg by mouth at bedtime.   Yes [provider]  allopurinol (ZYLOPRIM) 100 MG tablet Take 1 tablet (100 mg total) by mouth daily. 11/12/16   Suzan Slick, NP  bumetanide (BUMEX) 1 MG tablet Take 1.5 tablets (1.5 mg  total) by mouth daily. Please schedule appointment for refills. Patient not taking: Reported on 11/13/2016 08/11/15   Leonie Man, MD  ciprofloxacin (CIPRO) 500 MG tablet Take 500 mg by mouth 2 (two) times daily. 11/04/16   [provider]  clindamycin (CLEOCIN) 300 MG capsule Take 300 mg by mouth 4 (four) times daily. 11/04/16   [provider]  clopidogrel (PLAVIX) 75 MG tablet Take 1 tablet (75 mg total) by mouth daily. Patient not taking: Reported on 11/09/2016 07/23/16   Leonie Man, MD  diphenhydrAMINE (BENADRYL) 25 MG tablet Take 1 tablet (25 mg total) by mouth every 6 (six) hours. 11/13/16   Paeton Latouche, Bea Graff, PA-C  LORazepam (ATIVAN) 0.5 MG tablet Take 0.5 mg by mouth 4 (four) times daily. 11/03/16   [provider]  nitroGLYCERIN (NITROLINGUAL) 0.4 MG/SPRAY spray Place 1 spray under the tongue every 5 (five) minutes x 3 doses as needed for chest pain.     Leonie Man, MD  predniSONE (DELTASONE) 50 MG tablet Take 1 tablet (50 mg total) by mouth daily with breakfast. 11/13/16   Frederica Kuster, PA-C    Family History Family History  Problem Relation Age of Onset  . Diabetes Mother   . Hypertension Mother   . Kidney disease Mother   . Hypertension Father   . Emphysema Father   . Heart attack Father   . Heart disease Father   . Arthritis Sister   . Diabetes Sister   . Hypertension Sister   . Breast cancer Sister   . Hypertension Brother   . Hyperlipidemia Brother   . Diabetes Brother   . Stroke Brother   . Diabetes Sister   . Hypertension Sister   . Cervical cancer Sister   . Hypertension Brother   . Heart attack Daughter   . Hypertension Daughter   . COPD Daughter   . Breast cancer Daughter   . Kidney disease Daughter        Kidney mass  . Breast cancer Daughter     Social History Social History  Substance Use Topics  . Smoking status: Never Smoker  . Smokeless tobacco: Never Used  . Alcohol use No  Allergies   Lipitor  [atorvastatin]; Nsaids; Reglan [metoclopramide]; and Ultram [tramadol]   Review of Systems Review of Systems  Constitutional: Negative for chills and fever.  HENT: Negative for facial swelling and sore throat.   Respiratory: Negative for shortness of breath.   Cardiovascular: Negative for chest pain.  Gastrointestinal: Negative for abdominal pain, nausea and vomiting.  Genitourinary: Negative for dysuria.  Musculoskeletal: Negative for back pain.  Skin: Positive for rash. Negative for wound.  Neurological: Negative for headaches.  Psychiatric/Behavioral: The patient is not nervous/anxious.      Physical Exam Updated Vital Signs BP (!) 160/92   Pulse 81   Temp 97.7 F (36.5 C) (Oral)   Resp 19   Ht 5' 6.5" (1.689 m)   Wt 75.8 kg (167 lb)   SpO2 95%   BMI 26.55 kg/m   Physical Exam  Constitutional: She appears well-developed and well-nourished. No distress.  HENT:  Head: Normocephalic and atraumatic.  Mouth/Throat: Oropharynx is clear and moist. No oropharyngeal exudate.  Eyes: Pupils are equal, round, and reactive to light. Conjunctivae are normal. Right eye exhibits no discharge. Left eye exhibits no discharge. No scleral icterus.  Neck: Normal range of motion. Neck supple. No thyromegaly present.  Cardiovascular: Normal rate, regular rhythm, normal heart sounds and intact distal pulses.  Exam reveals no gallop and no friction rub.   No murmur heard. Pulmonary/Chest: Effort normal and breath sounds normal. No stridor. No respiratory distress. She has no wheezes. She has no rales.  Abdominal: Soft. Bowel sounds are normal. She exhibits no distension. There is no tenderness. There is no rebound and no guarding.  Musculoskeletal: She exhibits no edema.  Lymphadenopathy:    She has no cervical adenopathy.  Neurological: She is alert. Coordination normal.  Skin: Skin is warm and dry. No rash noted. She is not diaphoretic. No pallor.  Blanching, erythematous, nontender rash  worse over bilateral breasts, but spreading diffusely throughout the body  Psychiatric: She has a normal mood and affect.  Nursing note and vitals reviewed.      ED Treatments / Results  Labs (all labs ordered are listed, but only abnormal results are displayed) Labs Reviewed  CBC WITH DIFFERENTIAL/PLATELET - Abnormal; Notable for the following:       Result Value   HCT 35.6 (*)    All other components within normal limits  COMPREHENSIVE METABOLIC PANEL - Abnormal; Notable for the following:    Glucose, Bld 111 (*)    BUN 23 (*)    Creatinine, Ser 1.87 (*)    GFR calc non Af Amer 25 (*)    GFR calc Af Amer 29 (*)    All other components within normal limits  I-STAT TROPONIN, ED    EKG  EKG Interpretation None       Radiology No results found.  Procedures Procedures (including critical care time)  Medications Ordered in ED Medications  diphenhydrAMINE (BENADRYL) injection 25 mg (25 mg Intravenous Given 11/13/16 1958)  famotidine (PEPCID) IVPB 20 mg premix (0 mg Intravenous Stopped 11/13/16 2029)  methylPREDNISolone sodium succinate (SOLU-MEDROL) 125 mg/2 mL injection 125 mg (125 mg Intravenous Given 11/13/16 1958)  sodium chloride 0.9 % bolus 1,000 mL (0 mLs Intravenous Stopped 11/13/16 2145)     Initial Impression / Assessment and Plan / ED Course  I have reviewed the triage vital signs and the nursing notes.  Pertinent labs & imaging results that were available during my care of the patient were reviewed by me and considered  in my medical decision making (see chart for details).     Patient with rash after MRI contrast. Rash is allergic appearing. CBC, troponin unremarkable. CMP shows BUN 23, creatinine 1.7. Patient has had elevations of these in the past. Some improvement with Solu-Medrol, Benadryl, Pepcid, fluids in the ED. No signs of SJS or TEN or other emergent cause. No swelling of the lips, tongue, throat. We'll discharge home with 5 day burst of prednisone  as well as ongoing Benadryl. Follow-up to PCP for recheck. Return precautions discussed. Patient understands and agrees with plan. Patient vitals stable throughout ED course and discharged in satisfactory condition. Patient also evaluated by Dr. Stark Jock who guided the patient's management and agrees the plan.  Final Clinical Impressions(s) / ED Diagnoses   Final diagnoses:  Allergic reaction, initial encounter  Rash    New Prescriptions Discharge Medication List as of 11/13/2016  9:21 PM    START taking these medications   Details  !! diphenhydrAMINE (BENADRYL) 25 MG tablet Take 1 tablet (25 mg total) by mouth every 6 (six) hours., Starting Sat 11/13/2016, Print    predniSONE (DELTASONE) 50 MG tablet Take 1 tablet (50 mg total) by mouth daily with breakfast., Starting Sat 11/13/2016, Print     !! - Potential duplicate medications found. Please discuss with provider.       Frederica Kuster, PA-C 11/13/16 2251    Veryl Speak, MD 11/13/16 2329

## 2016-11-15 ENCOUNTER — Other Ambulatory Visit (INDEPENDENT_AMBULATORY_CARE_PROVIDER_SITE_OTHER): Payer: Self-pay | Admitting: Orthopedic Surgery

## 2016-11-15 ENCOUNTER — Ambulatory Visit (INDEPENDENT_AMBULATORY_CARE_PROVIDER_SITE_OTHER): Payer: Medicare Other | Admitting: Orthopedic Surgery

## 2016-11-15 ENCOUNTER — Encounter (INDEPENDENT_AMBULATORY_CARE_PROVIDER_SITE_OTHER): Payer: Self-pay | Admitting: Orthopedic Surgery

## 2016-11-15 DIAGNOSIS — M1A0720 Idiopathic chronic gout, left ankle and foot, without tophus (tophi): Secondary | ICD-10-CM

## 2016-11-15 DIAGNOSIS — Z9861 Coronary angioplasty status: Secondary | ICD-10-CM | POA: Diagnosis not present

## 2016-11-15 DIAGNOSIS — I2510 Atherosclerotic heart disease of native coronary artery without angina pectoris: Secondary | ICD-10-CM

## 2016-11-15 DIAGNOSIS — S86212S Strain of muscle(s) and tendon(s) of anterior muscle group at lower leg level, left leg, sequela: Secondary | ICD-10-CM

## 2016-11-15 MED ORDER — ALLOPURINOL 100 MG PO TABS: 100.0000 mg | ORAL_TABLET | Freq: Every day | ORAL | 3 refills | Status: DC

## 2016-11-15 MED ORDER — COLCHICINE 0.6 MG PO CAPS: 0.6000 mg | ORAL_CAPSULE | Freq: Every day | ORAL | 3 refills | Status: DC

## 2016-11-15 MED ORDER — COLCHICINE 0.6 MG PO TABS: ORAL_TABLET | ORAL | 5 refills | Status: DC

## 2016-11-15 MED ORDER — ALLOPURINOL 100 MG PO TABS: 100.0000 mg | ORAL_TABLET | Freq: Every day | ORAL | 0 refills | Status: DC

## 2016-11-15 MED ORDER — ALLOPURINOL 100 MG PO TABS: 100.0000 mg | ORAL_TABLET | Freq: Every day | ORAL | 6 refills | Status: DC

## 2016-11-15 NOTE — Progress Notes (Signed)
Office Visit Note   Patient: [name]           Date of Birth: 13-Sep-1940           MRN: [id number] Visit Date: 11/15/2016              Requested by: Red[address], [address] [place], [place] [address] PCP: Redmond School, MD  Chief Complaint  Patient presents with  . Left Foot - Pain      HPI: Patient is a 76 year old woman who presents in follow-up for gout left great toe MTP joint. Patient's uric acid was 7.9 the prescription was sent to her ongoing pharmacy and patient states that she would like [facility] to [facility]. Patient states she recently has had an MRI scan. Patient states she's recently been diagnosed with bilateral breast cancer. Patient was initially placed on 2 antibiotics including Cipro and clindamycin. She has stopped these medications but did develop a rash which she states is resolving. She did go to the hospital for the rash.  Assessment & Plan: Visit Diagnoses:  1. Traumatic rupture of left anterior tibial tendon, sequela   2. Chronic idiopathic gout involving toe of left foot without tophus     Plan: We will have her start with colchicine 0.6 mg a discontinuing her symptoms resolved and she will take allopurinol 100 mg twice a day. 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  Follow-Up Instructions: Return in about 3 months (around 02/15/2017).   Ortho Exam  Patient is alert, oriented, no adenopathy, well-dressed, normal affect, normal respiratory effort. On examination patient has active plantarflexion and dorsiflexion the left. She does have a slight foot drop on the left with ambulation. Patient has slight weakness with dorsiflexion but is not interested in any type of surgical intervention. There is no acute Changes to the great toe. No redness no cellulitis she does have venous stasis swelling bilaterally.  Imaging: No results found. No images are attached to the  encounter.  Labs: Lab Results  Component Value Date   HGBA1C 5.8 (H) 05/06/2016   HGBA1C 6.0 (H) 11/17/2015   HGBA1C 5.9 (H) 10/05/2013   LABURIC 7.9 (H) 11/11/2016   REPTSTATUS 11/18/2015 FINAL 11/17/2015   CULT (A) 11/17/2015    50,000 COLONIES/mL LACTOBACILLUS SPECIES Standardized susceptibility testing for this organism is not available.    LABORGA ESCHERICHIA COLI 12/31/2009    Orders:  No orders of the defined types were placed in this encounter.  Meds ordered this encounter  Medications  . allopurinol (ZYLOPRIM) 100 MG tablet    Sig: Take 1 tablet (100 mg total) by mouth daily.    Dispense:  30 tablet    Refill:  3     Procedures: No procedures performed  Clinical Data: No additional findings.  ROS:  All other systems negative, except as noted in the HPI. Review of Systems  Objective: Vital Signs: There were no vitals taken for this visit.  Specialty Comments:  No specialty comments available.  PMFS History: Patient Active Problem List   Diagnosis Date Noted  . Chronic idiopathic gout involving toe of left foot without tophus 11/15/2016  . Malignant 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, estrogen receptor positive ([place]) 11/09/2016  . Tendon tear, ankle, left, sequela   . Traumatic rupture of left anterior tibial tendon 04/20/2016  . Acute on chronic kidney failure ([place])   . Uncontrolled type 2 diabetes mellitus  with complication ([place])   . Headache, migraine   . Unexplained weight loss 11/17/2015  . Acute kidney injury superimposed on chronic kidney disease ([place]) 11/17/2015  . Restless legs syndrome 07/24/2015  . Mild cognitive impairment 01/08/2015  . Abnormality of gait 01/08/2015  . Dizziness 12/19/2014  . Preoperative cardiovascular examination 12/11/2013  . GERD (gastroesophageal reflux disease) 10/06/2013  . Syncope 10/05/2013  . Dementia 12/06/2012  . Chest pain at rest 10/28/2012  . Anxiety 10/28/2012    Class:  Acute  . Edema of both legs 10/28/2012  . CAD S/P percutaneous coronary angioplasty   . Essential hypertension   . Dyslipidemia, goal LDL below 70    Past Medical History:  Diagnosis Date  . Anxiety   . Arthritis     osteoarthritis  . CAD S/P percutaneous coronary angioplasty 2007; March 2011   [facility] [facility] 3.0 mm 20 mm postdilated 3.6 mm in early mid LAD; status post ISR Cutting Balloon PTCA and March 11 along with PCI of distal mid lesion with a 3.0 mm 12 mm MultiLink vision BMS; the proximal stent causes jailing of SP1 and SP2 with ostial 70-80% lesions  . Cancer (HCC)    Melanoma, Squamous cell Carcinoma  . CHF (congestive heart failure) ([place])   . Chronic kidney disease   . Dementia    mild  . Diabetes mellitus type 2 with neurological manifestations ([place])   . Dyslipidemia, goal LDL below 70   . Full dentures   . GERD (gastroesophageal reflux disease)   . Headache(784.0)   . History of hematuria    Followed by Dr. Gaynelle Arabian  . Hypertension, essential, benign   . Nausea & vomiting 10/2015  . Peripheral neuropathy   . TIA (transient ischemic attack)    multiple in the past  . Wears glasses     Family History  Problem Relation Age of Onset  . Diabetes Mother   . Hypertension Mother   . Kidney disease Mother   . Hypertension Father   . Emphysema Father   . Heart attack Father   . Heart disease Father   . Arthritis Sister   . Diabetes Sister   . Hypertension Sister   . Breast cancer Sister   . Hypertension Brother   . Hyperlipidemia Brother   . Diabetes Brother   . Stroke Brother   . Diabetes Sister   . Hypertension Sister   . Cervical cancer Sister   . Hypertension Brother   . Heart attack Daughter   . Hypertension Daughter   . COPD Daughter   . Breast cancer Daughter   . Kidney disease Daughter        Kidney mass  . Breast cancer Daughter     Past Surgical History:  Procedure Laterality Date  . ABDOMINAL HYSTERECTOMY    . APPENDECTOMY    . BLADDER  SUSPENSION    . CARDIAC CATHETERIZATION  02/24/2006   85% stenosis in the proximal portion of LAD-arrangements made for PCI on 02/25/2006  . CARDIAC CATHETERIZATION  02/25/2006   80% LAD lesion stented with a 3x10mm [employer] stent resulting in reduction of 80% lesion to 0% residual  . CARDIAC CATHETERIZATION  04/26/2006   Medical management  . CARDIAC CATHETERIZATION  06/24/2009   60-70% re-stenosis in the proximal LAD. A 3.25x15 cutting balloon, 3 inflations - 14atm-38sec, 13atm-39sec, and 12atm-40sec reduced to less than 10%. 60% stenosis of the mid/distal LAD stented with a 3x15mm Multilink stent.  Marland Kitchen CARDIAC CATHETERIZATION  07/09/2009  Medical management  . CAROTID DOPPLER  06/24/2009   40-59% R ICA stenosis. No significant ICA stenosis noted  . CHOLECYSTECTOMY    . JOINT REPLACEMENT Left   . LESION REMOVAL Right 03/11/2015   Procedure:  EXCISION OF RIGHT PRE TIBIAL LESION;  Surgeon: Judeth Horn, MD;  Location: [facility];  Service: General;  Laterality: Right;  . MASS EXCISION Left 06/10/2014   Procedure: EXCISION LEFT LOWER LEG LESION;  Surgeon: Doreen Salvage, MD;  Location: [facility];  Service: General;  Laterality: Left;  Marland Kitchen MASS EXCISION Left 09/05/2015   Procedure: EXCISION OF LEFT FOREARM  MASS;  Surgeon: Judeth Horn, MD;  Location: [facility];  Service: General;  Laterality: Left;  . NM MYOVIEW LTD  11/16/2011   5 beats a PVC during recovery.  No skin or infarction.  Reached 5 METs, EKG negative for ischemia   . SHOULDER ARTHROSCOPY  10/15  . SHOULDER ARTHROSCOPY  1995   left  . TENDON REPAIR Left 05/12/2016   Procedure: Left Anterior Tibial Tendon Reconstruction;  Surgeon: Newt Minion, MD;  Location: [facility];  Service: Orthopedics;  Laterality: Left;  . TOTAL KNEE ARTHROPLASTY  2005   left  . TRANSTHORACIC ECHOCARDIOGRAM  10/2015   EF 55-60 %. Mild LVH. Mild pulmonary hypertension. Normal valves.  . TRANSTHORACIC ECHOCARDIOGRAM   12/31/2009/July 2015   IsLV size & function, Gr 1 DD w/ mild TR.;; b)09/2013: 55-60%. Grade 1 DD, mild aortic sclerosis  . WRIST ARTHROPLASTY  2010   cancer lt wrist   Social History   Occupational History  . retired Retired   Social History Main Topics  . Smoking status: Never Smoker  . Smokeless tobacco: Never Used  . Alcohol use No  . Drug use: No  . Sexual activity: No

## 2016-11-15 NOTE — Addendum Note (Signed)
Addended by: Maxcine Ham on: 11/15/2016 01:48 PM   Modules accepted: Orders

## 2016-11-15 NOTE — Addendum Note (Signed)
Addended by: Dondra Prader R on: 11/15/2016 01:33 PM   Modules accepted: Orders

## 2016-11-15 NOTE — Telephone Encounter (Signed)
Patient called asking for a few month supply of her medication for gout sent to her pharmacy. If you have any questions just give [name] a call at [phone]

## 2016-11-15 NOTE — Addendum Note (Signed)
Addended by: Maxcine Ham on: 11/15/2016 01:32 PM   Modules accepted: Orders

## 2016-11-16 ENCOUNTER — Ambulatory Visit (HOSPITAL_BASED_OUTPATIENT_CLINIC_OR_DEPARTMENT_OTHER): Payer: Medicare Other | Admitting: Genetics

## 2016-11-16 ENCOUNTER — Other Ambulatory Visit: Payer: Self-pay | Admitting: Surgery

## 2016-11-16 ENCOUNTER — Encounter: Payer: Self-pay | Admitting: Genetics

## 2016-11-16 ENCOUNTER — Other Ambulatory Visit: Payer: Medicare Other

## 2016-11-16 DIAGNOSIS — Z803 Family history of malignant neoplasm of breast: Secondary | ICD-10-CM

## 2016-11-16 DIAGNOSIS — C50412 Malignant neoplasm of upper-outer quadrant of left female breast: Secondary | ICD-10-CM

## 2016-11-16 DIAGNOSIS — Z809 Family history of malignant neoplasm, unspecified: Secondary | ICD-10-CM

## 2016-11-16 DIAGNOSIS — Z800 Family history of malignant neoplasm of digestive organs: Secondary | ICD-10-CM | POA: Diagnosis not present

## 2016-11-16 DIAGNOSIS — Z85820 Personal history of malignant melanoma of skin: Secondary | ICD-10-CM | POA: Diagnosis not present

## 2016-11-16 DIAGNOSIS — Z8589 Personal history of malignant neoplasm of other organs and systems: Secondary | ICD-10-CM | POA: Diagnosis not present

## 2016-11-16 DIAGNOSIS — Z170 Estrogen receptor positive status [ER+]: Principal | ICD-10-CM

## 2016-11-16 DIAGNOSIS — C50912 Malignant neoplasm of unspecified site of left female breast: Secondary | ICD-10-CM

## 2016-11-16 DIAGNOSIS — C50911 Malignant neoplasm of unspecified site of right female breast: Secondary | ICD-10-CM | POA: Diagnosis not present

## 2016-11-16 DIAGNOSIS — Z806 Family history of leukemia: Secondary | ICD-10-CM | POA: Diagnosis not present

## 2016-11-16 DIAGNOSIS — Z808 Family history of malignant neoplasm of other organs or systems: Secondary | ICD-10-CM

## 2016-11-16 DIAGNOSIS — C50411 Malignant neoplasm of upper-outer quadrant of right female breast: Secondary | ICD-10-CM

## 2016-11-16 NOTE — Telephone Encounter (Signed)
I called and spoke with patient yesterday. She advised that per her insurance she can only receive a month supply to [facility] on [address], the long [facility] to [facility]. This was sent into both pharmacies. I called and spoke with pharmacy to explain why multiple prescriptions were sent in.

## 2016-11-16 NOTE — Progress Notes (Signed)
REFERRING PROVIDER: Hayden Pedro, PA-C [place], [place] [address]  PRIMARY PROVIDER:  Redmond School, MD  PRIMARY REASON FOR VISIT:  1. Bilateral malignant neoplasm of breast in female, estrogen receptor positive, unspecified site of breast ([place])   2. Family history of breast cancer   3. Family history of cancer      HISTORY OF PRESENT ILLNESS:   [name], a 76 y.o. female, was seen for a [facility] cancer genetics consultation at the request of Shona Simpson, PA-C due to a personal and family history of cancer.  [name] presents to clinic today to discuss the possibility of a hereditary predisposition to cancer, genetic testing, and to further clarify her future cancer risks, as well as potential cancer risks for family members. She was accompanied to her appointment by her husband.  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 In August 2018, she was found to have invasive ductal carcinoma of the left breast as well. Her treatment for both breast cancers is pending. 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 In August 2018, she was found to have invasive ductal carcinoma of the left breast as well. Her treatment for both breast cancers is pending. 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 [name] health history is significant for hysterectomy and bilateral salpingo-oophorectomy by age 60 due to an ectopic pregnancy and ovarian cyst. 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 She reports that she used Premarin from the time of this surgery until the time of her breast cancer diagnosis.  Past Medical History:  Diagnosis Date  .  Anxiety   . Arthritis     osteoarthritis  . CAD S/P percutaneous coronary angioplasty 2007; March 2011   [employer]' EMS 3.0 mm 20 mm postdilated 3.6 mm in early mid LAD; status post ISR Cutting Balloon PTCA and March 11 along with PCI of distal mid lesion with a 3.0 mm 12 mm MultiLink vision BMS; the proximal stent causes jailing of SP1 and SP2 with ostial 70-80% lesions  . Cancer (HCC)    Melanoma, Squamous cell Carcinoma  . CHF (congestive heart failure) ([place])   . Chronic kidney disease   . Dementia    mild  . Diabetes mellitus type 2 with neurological manifestations ([place])   . Dyslipidemia, goal LDL below 70   . Full dentures   . GERD (gastroesophageal reflux disease)   . Headache(784.0)   . History of hematuria    Followed by Dr. Gaynelle Arabian  . Hypertension, essential, benign   . Nausea & vomiting 10/2015  . Peripheral neuropathy   . TIA (transient ischemic attack)    multiple in the past  . Wears glasses     Past Surgical History:  Procedure Laterality Date  . ABDOMINAL HYSTERECTOMY    . APPENDECTOMY    . BLADDER SUSPENSION    . CARDIAC CATHETERIZATION  02/24/2006   85% stenosis in the proximal portion of LAD-arrangements made for PCI on 02/25/2006  . CARDIAC CATHETERIZATION  02/25/2006   80% LAD lesion stented with a 3x3m Liberte stent resulting in reduction of 80% lesion to  0% residual  . CARDIAC CATHETERIZATION  04/26/2006   Medical management  . CARDIAC CATHETERIZATION  06/24/2009   60-70% re-stenosis in the proximal LAD. A 3.25x15 cutting balloon, 3 inflations - 14atm-38sec, 13atm-39sec, and 12atm-40sec reduced to less than 10%. 60% stenosis of the mid/distal LAD stented with a 3x58m Multilink stent.  .Marland KitchenCARDIAC CATHETERIZATION  07/09/2009   Medical management  . CAROTID DOPPLER  06/24/2009   40-59% R ICA stenosis. No significant ICA stenosis noted  . CHOLECYSTECTOMY    . JOINT REPLACEMENT Left   . LESION REMOVAL Right 03/11/2015   Procedure:  EXCISION OF RIGHT PRE  TIBIAL LESION;  Surgeon: JJudeth Horn MD;  Location: [facility]  Service: General;  Laterality: Right;  . MASS EXCISION Left 06/10/2014   Procedure: EXCISION LEFT LOWER LEG LESION;  Surgeon: JDoreen Salvage MD;  [facility]  Service: General;  Laterality: Left;  .Marland KitchenMASS EXCISION Left 09/05/2015   Procedure: EXCISION OF LEFT FOREARM  MASS;  Surgeon: JJudeth Horn MD;  [facility]  Service: General;  Laterality: Left;  . NM MYOVIEW LTD  11/16/2011   5 beats a PVC during recovery.  No skin or infarction.  Reached 5 METs, EKG negative for ischemia   . SHOULDER ARTHROSCOPY  10/15  . SHOULDER ARTHROSCOPY  1995   left  . TENDON REPAIR Left 05/12/2016   Procedure: Left Anterior Tibial Tendon Reconstruction;  Surgeon: MNewt Minion MD;  Location: [facility]  Service: Orthopedics;  Laterality: Left;  . TOTAL KNEE ARTHROPLASTY  2005   left  . TRANSTHORACIC ECHOCARDIOGRAM  10/2015   EF 55-60 %. Mild LVH. Mild pulmonary hypertension. Normal valves.  . TRANSTHORACIC ECHOCARDIOGRAM  12/31/2009/July 2015   IsLV size & function, Gr 1 DD w/ mild TR.;; b)09/2013: 55-60%. Grade 1 DD, mild aortic sclerosis  . WRIST ARTHROPLASTY  2010   cancer lt wrist    Social History   Social History  . Marital status: Married    Spouse name: JSonia Side . Number of children: 3  . Years of education: 159  Occupational History  . retired Retired   Social History Main Topics  . Smoking status: Never Smoker  . Smokeless tobacco: Never Used  . Alcohol use No  . Drug use: No  . Sexual activity: No   Other Topics Concern  . Not on file   Social History Narrative   She is a married mother of 375 grandmother of 2.  Usually very active and out about the house.  But currently over last 6-8 weeks has been incapacitated.  She never smoked and does not drink alcohol.     FAMILY HISTORY:  We obtained a detailed, 4-generation family history.  Significant diagnoses are listed  below: Family History  Problem Relation Age of Onset  . Diabetes Mother   . Hypertension Mother   . Kidney disease Mother   . Hypertension Father   . Emphysema Father   . Heart attack Father   . Heart disease Father   . Arthritis Sister   . Diabetes Sister   . Hypertension Sister   . Breast cancer Sister 344      treated with mastectomy  . Cervical cancer Sister 261 . Hypertension Brother   . Hyperlipidemia Brother   . Diabetes Brother   . Stroke Brother   . Diabetes Sister   . Hypertension Sister   . Stomach cancer Sister 634 . Hypertension Brother   .  ALS Brother        d.62s  . Breast cancer Daughter 45       triple negative treated with lumpectomy, chemo, radiation  . Heart attack Daughter 81       d.45  . COPD Daughter   . Cancer Paternal Aunt        unspecified type  . Cancer Paternal Uncle        unspecified type  . Cancer Paternal Uncle        unspecified type   [name] had three daughters. Two are deceased. Her daughter, Mariann Laster, died at age 66 from a heart attack and also had a history of COPD. Her daughter, Hoyle Sauer, died at age 34 in a car accident. Hoyle Sauer had a daughter, [name], who [name] raised as her own daughter and is now 33. [name] third daughter, Levada Dy, was diagnosed with breast cancer at age 72. Her cancer is reported to have been triple negative and was treated with lumpectomy, chemotherapy, and radiation. Levada Dy is currently 81 and is reportedly doing well.   [name] has two brothers and two sisters. Her sister, Lelon Frohlich, had cervical cancer in her 57s and breast cancer in her late-20s or early-30s. Her breast cancer was treated with mastectomy. [name] reports that Lelon Frohlich did have bone metastases from her breast cancer, but was recently told that she has no evidence of disease and responded well to a pill. Lelon Frohlich has a son and daughter with unspecified forms of cancer. [name] other sister, Bethena Roys, had "stomach" cancer that [name] reports was "on the inside  of her intestine" in her late-60s. Bethena Roys is currently 64 and is doing well, though [name]. Icenhower reports that Bethena Roys has to puree all of her food. [name]. [place] brother, Fritz Pickerel, recently died of ALS in his early-60s. Her other brother, Laverna Peace, is 53 without cancers. Jimmy's daughter has an unspecified form of cancer.   [name]. Solem's mother died at 67 from a heart attack. Her mother had "tons" of siblings and [name]. Buttler reports that many died before she was born. [name] does not know of any specific cancers in these family members. Both maternal grandparents are deceased, but ages at death and cause of death are not known.  [name]. Climer's father died at 34 from a heart attack. Her father also had many siblings, but she does not know the exact number. She reports two uncles and an aunt died from unspecified forms of cancer. One of the uncles had 57 children and all but one died of cancer. Both of [name] paternal grandparents are deceased, but ages at death and cause of death are not known.   [name] is unaware of previous family history of genetic testing for hereditary cancer risks. Patient's maternal ancestors are of English/Scotch-Irish descent, and paternal ancestors are of [place] descent. There is no reported Ashkenazi Jewish ancestry. There is no known consanguinity.  GENETIC COUNSELING ASSESSMENT: YAZMEEN [name] is a 76 y.o. female with a personal and family history which is somewhat suggestive of a hereditary cancer syndrome and predisposition to cancer. We, therefore, discussed and recommended the following at today's visit.   DISCUSSION: We reviewed the characteristics, features and inheritance patterns of hereditary cancer syndromes. We also discussed genetic testing, including the appropriate family members to test, the process of testing, insurance coverage and turn-around-time for results. We discussed the implications of a negative, positive and/or variant of uncertain significant result. In order to get genetic test  results in a timely manner so that [name]. Rollo can use these genetic test results for surgical decisions, we recommended [name]. Weitzel pursue genetic testing for the 9-gene High/Moderate STAT Breast Risk Panel. If this test is negative, we then recommend [name]. Lerew pursue reflex genetic testing to the Common Hereditary Cancers Panel. [facility]'s Common Hereditary Cancers Panel includes analysis of the following 46 genes: APC, ATM, AXIN2, BARD1, BMPR1A, BRCA1, BRCA2, BRIP1, CDH1, CDKN2A, CHEK2, CTNNA1, DICER1, EPCAM, GREM1, HOXB13, KIT, MEN1, MLH1, MSH2, MSH3, MSH6, MUTYH, NBN, NF1, NTHL1, PALB2, PDGFRA, PMS2, POLD1, POLE, PTEN, RAD50, RAD51C, RAD51D, SDHA, SDHB, SDHC, SDHD, SMAD4, SMARCA4, STK11, TP53, TSC1, TSC2, and VHL.   Based on [name]. Herdt's personal and family history of cancer, she meets medical criteria for genetic testing. Despite that she meets criteria, she may still have an out of pocket cost. We discussed that if her out of pocket cost for testing is over $100, the laboratory will call and confirm whether she wants to proceed with testing.  If the out of pocket cost of testing is less than $100 she will be billed by the genetic testing laboratory. Because [name]. Walrath expressed concern regarding financial stressors, I will ask the lab to contact her regarding any out of pocket expense.  PLAN: After considering the risks, benefits, and limitations, [name]. Henle  provided informed consent to pursue genetic testing and the blood sample was sent to [facility] for analysis of the 9-gene High/Moderate STAT Breast Risk Panel with reflex to the 46-gene Common Hereditary Cancers Panel. Results should be available within approximately 2 weeks' time, at which point they will be disclosed by telephone to [name]. Reddish, as will any additional recommendations warranted by these results. This information will also be available in Epic.   Lastly, we encouraged [name]. Szczesniak to remain in contact with cancer genetics annually so that we can  continuously update the family history and inform her of any changes in cancer genetics and testing that may be of benefit for this family.   [name].  Depaoli questions were answered to her satisfaction today. Our contact information was provided should additional questions or concerns arise. Thank you for the referral and allowing [place] to share in the care of your patient.   Mal Misty, [name], [facility] Certified Naval [email or web address] phone: [phone]  The patient was seen for a total of 45 minutes in face-to-face genetic counseling.    _______________________________________________________________________ For Office Staff:  Number of people involved in session: 2 Was an Intern/ student involved with case: yes

## 2016-11-18 ENCOUNTER — Encounter: Payer: Self-pay | Admitting: *Deleted

## 2016-11-18 ENCOUNTER — Ambulatory Visit (HOSPITAL_BASED_OUTPATIENT_CLINIC_OR_DEPARTMENT_OTHER): Payer: Medicare Other | Admitting: Hematology

## 2016-11-18 ENCOUNTER — Encounter: Payer: Self-pay | Admitting: Hematology

## 2016-11-18 VITALS — BP 157/87 | HR 92 | Temp 98.3°F | Resp 20 | Ht 66.5 in | Wt 173.0 lb

## 2016-11-18 DIAGNOSIS — I2510 Atherosclerotic heart disease of native coronary artery without angina pectoris: Secondary | ICD-10-CM | POA: Diagnosis not present

## 2016-11-18 DIAGNOSIS — Z170 Estrogen receptor positive status [ER+]: Secondary | ICD-10-CM

## 2016-11-18 DIAGNOSIS — C50412 Malignant neoplasm of upper-outer quadrant of left female breast: Secondary | ICD-10-CM | POA: Diagnosis not present

## 2016-11-18 DIAGNOSIS — C50211 Malignant neoplasm of upper-inner quadrant of right female breast: Secondary | ICD-10-CM

## 2016-11-18 DIAGNOSIS — E119 Type 2 diabetes mellitus without complications: Secondary | ICD-10-CM | POA: Diagnosis not present

## 2016-11-18 DIAGNOSIS — I10 Essential (primary) hypertension: Secondary | ICD-10-CM | POA: Diagnosis not present

## 2016-11-18 NOTE — Progress Notes (Signed)
[place]  [phone] Fax:([phone] [place] Note   Patient Care Team: Redmond School, MD as PCP - General (Internal Medicine) Elmarie Shiley, MD as Consulting Physician (Nephrology) Leonie Man, MD as Consulting Physician (Cardiology) 11/18/2016  REFERRED BY: Dr. Lucia Gaskins  CHIEF COMPLAINTS/PURPOSE OF CONSULTATION:  Bilateral invasive breast cancer  Oncology History   Cancer Staging Malignant 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, estrogen receptor positive ([place]) Staging form: Breast, AJCC 8th Edition - Clinical: Stage IA (cT1c, cN0, cM0, G1, ER: Positive, PR: Positive, HER2: Negative) - Unsigned       Malignant 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, estrogen receptor positive ([place])   10/20/2016 Initial Biopsy    Diagnosis Breast, right, needle core biopsy, 11:30 o'clock - INVASIVE DUCTAL CARCINOMA,      10/20/2016 Initial Diagnosis    Malignant 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, estrogen receptor positive ([place])      10/20/2016 Receptors her2    Estrogen Receptor: 100%, POSITIVE, STRONG STAINING INTENSITY Progesterone Receptor: 100%, POSITIVE, STRONG STAINING INTENSITY Proliferation Marker Ki67: 5% HER2 NEGATIVE       10/20/2016 Mammogram    Diagnostic mammogram showed persistent distortion corresponds to a vaguely palpable irregular mass and is suspicious for malignancy.Targeted ultrasound is performed, showing an irregular hypoechoic mass with posterior acoustic shadowing in the 11:30 o'clock location of the right breast 4 cm from the nipple which measures 0.9 x 0.9 x0.8 cm. There is associated internal vascularity. Evaluation of the axilla is negative for adenopathy.      11/01/2016 Imaging    B/l BREAST MRI: IMPRESSION: 1. There is a 1.5 cm biopsy proven malignancy in the upper slightly outer quadrant of the right breast. No additional sites of  disease are identified.  2. There is an indeterminate 6 mm enhancing mass in the upper-outer left breast.      11/11/2016 Pathology Results    Diagnosis Breast, left, needle core biopsy, upper outer - INVASIVE DUCTAL CARCINOMA. - ATYPICAL LOBULAR HYPERPLASIA. - FIBROCYSTIC CHANGES AND PSEUDOANGIOMATOUS STROMAL HYPERPLASIA ([place]).      11/11/2016 Receptors her2     Estrogen Receptor: 100%, POSITIVE, STRONG STAINING INTENSITY Progesterone Receptor: 10%, POSITIVE, WEAK STAINING INTENSITY Proliferation Marker Ki67: 2% HER2 NEGATIVE        HISTORY OF PRESENTING ILLNESS:  [name] 76 y.o. female is here because of newly diagnosed bilateral breast cancer. She reports to not feeling the lumps herself and cancer was found on screening mammogram followed by MR. 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 In August of 2018, she was also found to have invasive ductal carcinoma in the right breast as well. 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 She has a family history of breast cancer including her sister and daughter, both in their early 95s.   She began taking estrogen at 27 following a hysterectomy and bilateral salpingo-oophorectomy due to an ectopic pregnancy and ovarian cyst. 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   She is very active at home as she cleans her house and mows her own line. Per her husband, she is "very hyper and has trouble with her balance." She is otherwise well at home.    MEDICAL HISTORY:  Past Medical History:  Diagnosis Date  . Anxiety   . Arthritis     osteoarthritis  .  CAD S/P percutaneous coronary angioplasty 2007; March 2011   [facility] 3.0 mm 20 mm postdilated 3.6 mm in early mid LAD; status post ISR Cutting Balloon PTCA and March 11 along with PCI of distal mid lesion with a 3.0 mm 12 mm MultiLink vision BMS; the proximal stent  causes jailing of SP1 and SP2 with ostial 70-80% lesions  . Cancer (HCC)    Melanoma, Squamous cell Carcinoma  . CHF (congestive heart failure) ([place])   . Chronic kidney disease   . Dementia    mild  . Diabetes mellitus type 2 with neurological manifestations ([place])   . Dyslipidemia, goal LDL below 70   . Full dentures   . GERD (gastroesophageal reflux disease)   . Headache(784.0)   . History of hematuria    Followed by Dr. Gaynelle Arabian  . Hypertension, essential, benign   . Nausea & vomiting 10/2015  . Peripheral neuropathy   . TIA (transient ischemic attack)    multiple in the past  . Wears glasses     SURGICAL HISTORY: Past Surgical History:  Procedure Laterality Date  . ABDOMINAL HYSTERECTOMY    . APPENDECTOMY    . BLADDER SUSPENSION    . CARDIAC CATHETERIZATION  02/24/2006   85% stenosis in the proximal portion of LAD-arrangements made for PCI on 02/25/2006  . CARDIAC CATHETERIZATION  02/25/2006   80% LAD lesion stented with a 3x50m Liberte stent resulting in reduction of 80% lesion to 0% residual  . CARDIAC CATHETERIZATION  04/26/2006   Medical management  . CARDIAC CATHETERIZATION  06/24/2009   60-70% re-stenosis in the proximal LAD. A 3.25x15 cutting balloon, 3 inflations - 14atm-38sec, 13atm-39sec, and 12atm-40sec reduced to less than 10%. 60% stenosis of the mid/distal LAD stented with a 3x174mMultilink stent.  . Marland KitchenARDIAC CATHETERIZATION  07/09/2009   Medical management  . CAROTID DOPPLER  06/24/2009   40-59% R ICA stenosis. No significant ICA stenosis noted  . CHOLECYSTECTOMY    . JOINT REPLACEMENT Left   . LESION REMOVAL Right 03/11/2015   Procedure:  EXCISION OF RIGHT PRE TIBIAL LESION;  Surgeon: JaJudeth HornMD;  [facility];  Laterality: Right;  . MASS EXCISION Left 06/10/2014   Procedure: EXCISION LEFT LOWER LEG LESION;  Surgeon: JaDoreen SalvageMD;  [facility]: General;  Laterality: Left;   . Marland KitchenASS EXCISION Left 09/05/2015   Procedure: EXCISION OF LEFT FOREARM  MASS;  Surgeon: JaJudeth HornMD;  [facility] Service: General;  Laterality: Left;  . NM MYOVIEW LTD  11/16/2011   5 beats a PVC during recovery.  No skin or infarction.  Reached 5 METs, EKG negative for ischemia   . SHOULDER ARTHROSCOPY  10/15  . SHOULDER ARTHROSCOPY  1995   left  . TENDON REPAIR Left 05/12/2016   Procedure: Left Anterior Tibial Tendon Reconstruction;  Surgeon: MaNewt MinionMD;  [facility] Service: Orthopedics;  Laterality: Left;  . TOTAL KNEE ARTHROPLASTY  2005   left  . TRANSTHORACIC ECHOCARDIOGRAM  10/2015   EF 55-60 %. Mild LVH. Mild pulmonary hypertension. Normal valves.  . TRANSTHORACIC ECHOCARDIOGRAM  12/31/2009/July 2015   IsLV size & function, Gr 1 DD w/ mild TR.;; b)09/2013: 55-60%. Grade 1 DD, mild aortic sclerosis  . WRIST ARTHROPLASTY  2010   cancer lt wrist    SOCIAL HISTORY: Social History   Social History  . Marital status: Married    Spouse name:  [name]  . Number of children: 3  . Years of education: 75   Occupational History  . retired Retired   Social History Main Topics  . Smoking status: Never Smoker  . Smokeless tobacco: Never Used  . Alcohol use No  . Drug use: No  . Sexual activity: No   Other Topics Concern  . Not on file   Social History Narrative   She is a married mother of 38, grandmother of 2.  Usually very active and out about the house.  But currently over last 6-8 weeks has been incapacitated.  She never smoked and does not drink alcohol.    FAMILY HISTORY: Family History  Problem Relation Age of Onset  . Diabetes Mother   . Hypertension Mother   . Kidney disease Mother   . Hypertension Father   . Emphysema Father   . Heart attack Father   . Heart disease Father   . Arthritis Sister   . Diabetes Sister   . Hypertension Sister   . Breast cancer Sister 68       treated with mastectomy  . Cervical cancer Sister  50  . Hypertension Brother   . Hyperlipidemia Brother   . Diabetes Brother   . Stroke Brother   . Diabetes Sister   . Hypertension Sister   . Stomach cancer Sister 1  . Hypertension Brother   . ALS Brother        d.62s  . Breast cancer Daughter 22       triple negative treated with lumpectomy, chemo, radiation  . Heart attack Daughter 27       d.45  . COPD Daughter   . Cancer Paternal Aunt        unspecified type  . Cancer Paternal Uncle        unspecified type  . Cancer Paternal Uncle        unspecified type    ALLERGIES:  is allergic to lipitor [atorvastatin]; nsaids; reglan [metoclopramide]; and ultram [tramadol].  MEDICATIONS:  Current Outpatient Prescriptions  Medication Sig Dispense Refill  . allopurinol (ZYLOPRIM) 100 MG tablet Take 1 tablet (100 mg total) by mouth daily. 30 tablet 6  . Biotin w/ Vitamins C & E (HAIR SKIN & NAILS GUMMIES PO) Take 2 tablets by mouth daily.     . bumetanide (BUMEX) 2 MG tablet Take 2 mg by mouth daily.    . busPIRone (BUSPAR) 7.5 MG tablet Take 7.5 mg by mouth 2 (two) times daily.    . Calcium Carb-Cholecalciferol (CALCIUM 600 + D PO) Take 1 tablet by mouth at bedtime.     . Cholecalciferol 2000 units CAPS Take 2,000 Units by mouth daily.    . COD LIVER OIL PO Take 415 mg by mouth daily.    . colchicine 0.6 MG tablet Take one tablet daily during acute gout attack for 3 days, discontinue after this. Resume in the event of another acute gout flare up. 30 tablet 5  . CRANBERRY PO Take 1 capsule by mouth daily.    . diphenhydrAMINE (BENADRYL) 25 MG tablet Take 1 tablet (25 mg total) by mouth every 6 (six) hours. 20 tablet 0  . diphenhydrAMINE (BENADRYL) 50 MG tablet Take 50 mg by mouth at bedtime as needed for itching.    . Fish Oil-Krill Oil (KRILL OIL PLUS PO) Take 1 capsule by mouth daily.     Marland Kitchen gabapentin (NEURONTIN) 300 MG capsule Take 600 mg by mouth at bedtime.     Marland Kitchen  Iron-FA-B Cmp-C-Biot-Probiotic (FUSION PLUS) CAPS takes 1 tablet  daily  9  . LORazepam (ATIVAN) 0.5 MG tablet Take 0.5 mg by mouth 4 (four) times daily.    Marland Kitchen losartan (COZAAR) 100 MG tablet take 1 tablet daily  3  . Melatonin 5 MG CAPS Take 5 mg by mouth at bedtime.    . Methylfol-Methylcob-Acetylcyst (CEREFOLIN NAC) 6-2-600 MG TABS TAKE 1 CAPLET BY MOUTH ONCE DAILY. 90 each 1  . metoprolol succinate (TOPROL XL) 50 MG 24 hr tablet Take 0.5 tablet (25 mg total) by mouth daily. 45 tablet 3  . pantoprazole (PROTONIX) 40 MG tablet TAKE 1 TABLET DAILY (CONTACT OFFICE FOR ADDITIONAL REFILLS, FINAL ATTEMPT) 90 tablet 0  . pramipexole (MIRAPEX) 0.25 MG tablet Take .25 mg at 4pm and 2 tabs (0.'5mg'$ )at bedtime 270 tablet 1  . predniSONE (DELTASONE) 50 MG tablet Take 1 tablet (50 mg total) by mouth daily with breakfast. 5 tablet 0  . Resveratrol (RESERVAPAK PO) Take 1 tablet by mouth every evening.     . rosuvastatin (CRESTOR) 20 MG tablet Take 20 mg by mouth at bedtime.    . clopidogrel (PLAVIX) 75 MG tablet Take 1 tablet (75 mg total) by mouth daily. (Patient not taking: Reported on 11/09/2016) 90 tablet 3  . nitroGLYCERIN (NITROLINGUAL) 0.4 MG/SPRAY spray Place 1 spray under the tongue every 5 (five) minutes x 3 doses as needed for chest pain.      No current facility-administered medications for this visit.     REVIEW OF SYSTEMS:   Constitutional: Denies fevers, chills or abnormal night sweats Eyes: Denies blurriness of vision, double vision or watery eyes Ears, nose, mouth, throat, and face: Denies mucositis or sore throat Respiratory: Denies cough, dyspnea or wheezes Cardiovascular: Denies palpitation, chest discomfort or lower extremity swelling Gastrointestinal:  Denies nausea, heartburn or change in bowel habits Skin: Denies abnormal skin rashes Lymphatics: Denies new lymphadenopathy or easy bruising Neurological:Denies numbness, tingling or new weaknesses Behavioral/Psych: Mood is stable, no new changes  All other systems were reviewed with the patient and  are negative.  PHYSICAL EXAMINATION:  ECOG PERFORMANCE STATUS: 0 - Asymptomatic  Vitals:   11/18/16 1117  BP: (!) 157/87  Pulse: 92  Resp: 20  Temp: 98.3 F (36.8 C)  SpO2: 96%   Filed Weights   11/18/16 1117  Weight: 173 lb (78.5 kg)    GENERAL:alert, no distress and comfortable SKIN: skin color, texture, turgor are normal, no rashes or significant lesions EYES: normal, conjunctiva are pink and non-injected, sclera clear OROPHARYNX:no exudate, no erythema and lips, buccal mucosa, and tongue normal  NECK: supple, thyroid normal size, non-tender, without nodularity LYMPH:  no palpable lymphadenopathy in the cervical, axillary or inguinal LUNGS: clear to auscultation and percussion with normal breathing effort HEART: regular rate & rhythm and no murmurs and no lower extremity edema ABDOMEN:abdomen soft, non-tender and normal bowel sounds Musculoskeletal:no cyanosis of digits and no clubbing  PSYCH: alert & oriented x 3 with fluent speech NEURO: no focal motor/sensory deficits Breasts: Breast inspection showed them to be symmetrical with no nipple discharge. Palpation of the breasts and axilla revealed no obvious mass that I could appreciate.  LABORATORY DATA:  I have reviewed the data as listed CBC Latest Ref Rng & Units 11/13/2016 05/06/2016 11/18/2015  WBC 4.0 - 10.5 K/uL 9.0 8.9 7.4  Hemoglobin 12.0 - 15.0 g/dL 12.2 12.5 9.6(L)  Hematocrit 36.0 - 46.0 % 35.6(L) 39.0 30.5(L)  Platelets 150 - 400 K/uL 242 196 204  CMP Latest Ref Rng & Units 11/13/2016 05/06/2016 11/18/2015  Glucose 65 - 99 mg/dL 111(H) 104(H) 105(H)  BUN 6 - 20 mg/dL 23(H) 13 11  Creatinine 0.44 - 1.00 mg/dL 1.87(H) 1.42(H) 1.25(H)  Sodium 135 - 145 mmol/L 137 137 139  Potassium 3.5 - 5.1 mmol/L 4.3 5.1 4.0  Chloride 101 - 111 mmol/L 103 100(L) 110  CO2 22 - 32 mmol/L '25 25 22  '$ Calcium 8.9 - 10.3 mg/dL 9.7 9.9 8.0(L)  Total Protein 6.5 - 8.1 g/dL 7.1 - -  Total Bilirubin 0.3 - 1.2 mg/dL 0.3 - -  Alkaline  Phos 38 - 126 U/L 68 - -  AST 15 - 41 U/L 25 - -  ALT 14 - 54 U/L 19 - -    PATHOLOGY Diagnosis 11/11/2016 Breast, left, needle core biopsy, upper outer - INVASIVE MAMMARY CARCINOMA. - ATYPICAL LOBULAR HYPERPLASIA. - FIBROCYSTIC CHANGES AND PSEUDOANGIOMATOUS STROMAL HYPERPLASIA ([place]).  Diagnosis 10/10/2016 Breast, right, needle core biopsy, 11:30 o'clock - INVASIVE DUCTAL CARCINOMA, SEE COMMENT.  Estrogen Receptor: 100%, POSITIVE, STRONG STAINING INTENSITY Progesterone Receptor: 100%, POSITIVE, STRONG STAINING INTENSITY Proliferation Marker Ki67: 5% HER2 - NEGATIVE  Diagnosis 09/05/15 Skin , Left forearm - INVASIVE WELL DIFFERENTIATED SQUAMOUS CELL CARCINOMA, MEASURING 0.6 CM IN GREATEST DIMENSION. - SOLAR ELASTOSIS IS PRESENT. - MARGINS ARE NEGATIVE. - SEE ONCOLOGY TEMPLATE.  RADIOGRAPHIC STUDIES: I have personally reviewed the radiological images as listed and agreed with the findings in the report. Mr Foot Left W/o Contrast  Result Date: 11/10/2016 CLINICAL DATA:  Left foot infection.  Evaluate for osteomyelitis. EXAM: MRI OF THE LEFT FOOT WITHOUT CONTRAST TECHNIQUE: Multiplanar, multisequence MR imaging of the left foot was performed. No intravenous contrast was administered. COMPARISON:  Left foot x-rays dated November 04, 2016. Left foot MRI dated February 23, 2016. FINDINGS: Bones/Joint/Cartilage There is a juxta-articular erosion along the medial aspect of the first proximal phalanx head without significant surrounding edema. No focal marrow replacing lesion. No suspicious marrow signal abnormality. Mild degenerative changes at the first MTP joint with associated small effusion. Mild midfoot degenerative changes. Ligaments The Lisfranc ligament is intact. Muscles and Tendons The visualized flexor and extensor tendons are intact. Soft tissues Mild dorsal forefoot soft tissue swelling. No drainable fluid collection. IMPRESSION: 1. No evidence of osteomyelitis. 2. Small juxta-articular  erosion at the medial aspect of the first proximal phalanx head, as seen on prior plain film, is nonspecific, but could be seen in gout. This was present on prior MRI from November 2017. 3. Mild dorsal forefoot soft tissue swelling, as can be seen in cellulitis. No drainable fluid collection. Electronically Signed   By: Titus Dubin M.D.   On: 11/10/2016 17:05   Mr Breast Bilateral W Wo Contrast  Result Date: 11/01/2016 CLINICAL DATA:  76 year old female with history of recently diagnosed grade 1 invasive ductal carcinoma of the right breast at 11:30. The patient has family history of breast cancer in her sister at age 35 and her daughter at age 72. LABS:  Creatinine was obtained on site at [place] at [address] Results: Creatinine 1.4 mg/dL with GFR of 36. EXAM: BILATERAL BREAST MRI WITH AND WITHOUT CONTRAST TECHNIQUE: Multiplanar, multisequence MR images of both breasts were obtained prior to and following the intravenous administration of 8 ml of MultiHance. THREE-DIMENSIONAL MR IMAGE RENDERING ON INDEPENDENT WORKSTATION: Three-dimensional MR images were rendered by post-processing of the original MR data on an independent workstation. The three-dimensional MR images were interpreted, and findings are reported in  the following complete MRI report for this study. Three dimensional images were evaluated at the independent DynaCad workstation COMPARISON:  Previous exam(s). FINDINGS: Breast composition: c. Heterogeneous fibroglandular tissue. Background parenchymal enhancement: Mild. Right breast: In the upper-outer quadrant of the right breast, middle depth there is an enhancing mass with internal susceptibility indicating the biopsy marking clip from the patient's recent biopsy. This biopsied known malignancy measures 1.5 x 0.8 x 1.1 cm. Left breast: In the upper-outer quadrant of the left breast, middle to posterior depth there is a 6 mm enhancing mass. Lymph nodes: No abnormal appearing  lymph nodes. Ancillary findings:  None. IMPRESSION: 1. There is a 1.5 cm biopsy proven malignancy in the upper slightly outer quadrant of the right breast. No additional sites of disease are identified. 2. There is an indeterminate 6 mm enhancing mass in the upper-outer left breast. RECOMMENDATION: MRI guided biopsy is recommended for the left breast mass. BI-RADS CATEGORY  4: Suspicious. Electronically Signed   By: Ammie Ferrier M.D.   On: 11/01/2016 16:27   Dg Foot Complete Left  Result Date: 11/04/2016 CLINICAL DATA:  Puncture wound under the great toe with swelling and redness EXAM: LEFT FOOT - COMPLETE 3+ VIEW COMPARISON:  MRI of the left foot of 02/23/2016 FINDINGS: On the frontal view there is cortical erosion and focal demineralization of the distal portion of the proximal phalanx of the left great toe, very suspicious for active osteomyelitis with adjacent soft tissue swelling. No other acute abnormality is seen. Joint spaces appear normal for age. IMPRESSION: There is cortical erosion and focal demineralization of the distal portion of the proximal phalanx of the left great toe very suspicious for osteomyelitis. Electronically Signed   By: Ivar Drape M.D.   On: 11/04/2016 17:14   US Breast Ltd Uni Right Inc Axilla  Result Date: 10/20/2016 CLINICAL DATA:  Patient returns after screening study for evaluation of possible right breast distortion. The patient's daughter and sister were diagnosed with breast cancer. EXAM: 2D DIGITAL DIAGNOSTIC RIGHT MAMMOGRAM WITH CAD AND ADJUNCT TOMO ULTRASOUND RIGHT BREAST COMPARISON:  10/12/2016 and earlier ACR Breast Density Category c: The breast tissue is heterogeneously dense, which may obscure small masses. FINDINGS: Additional 2-D and 3-D images are performed. These images demonstrate persistent distortion in the upper-outer quadrant of the right breast. Mammographic images were processed with CAD. On physical exam, I palpate soft focal thickening in the 11  o'clock location of the right breast. I palpate no axillary adenopathy. Targeted ultrasound is performed, showing an irregular hypoechoic mass with posterior acoustic shadowing in the 11:30 o'clock location of the right breast 4 cm from the nipple which measures 0.9 x 0.9 x 0.8 cm. There is associated internal vascularity. Evaluation of the axilla is negative for adenopathy. IMPRESSION: 1. Persistent area of distortion corresponds to a vaguely palpable irregular mass and is suspicious for malignancy. 2. No adenopathy by ultrasound. RECOMMENDATION: Ultrasound-guided core biopsy of mass in the 11:30 o'clock location of the right breast. I have discussed the findings and recommendations with the patient. Results were also provided in writing at the conclusion of the visit. If applicable, a reminder letter will be sent to the patient regarding the next appointment. BI-RADS CATEGORY  4: Suspicious. Electronically Signed   By: Nolon Nations M.D.   On: 10/20/2016 09:00   Mm Diag Breast Tomo Uni Right  Result Date: 10/20/2016 CLINICAL DATA:  Patient returns after screening study for evaluation of possible right breast distortion. The patient's daughter and sister  were diagnosed with breast cancer. EXAM: 2D DIGITAL DIAGNOSTIC RIGHT MAMMOGRAM WITH CAD AND ADJUNCT TOMO ULTRASOUND RIGHT BREAST COMPARISON:  10/12/2016 and earlier ACR Breast Density Category c: The breast tissue is heterogeneously dense, which may obscure small masses. FINDINGS: Additional 2-D and 3-D images are performed. These images demonstrate persistent distortion in the upper-outer quadrant of the right breast. Mammographic images were processed with CAD. On physical exam, I palpate soft focal thickening in the 11 o'clock location of the right breast. I palpate no axillary adenopathy. Targeted ultrasound is performed, showing an irregular hypoechoic mass with posterior acoustic shadowing in the 11:30 o'clock location of the right breast 4 cm from the  nipple which measures 0.9 x 0.9 x 0.8 cm. There is associated internal vascularity. Evaluation of the axilla is negative for adenopathy. IMPRESSION: 1. Persistent area of distortion corresponds to a vaguely palpable irregular mass and is suspicious for malignancy. 2. No adenopathy by ultrasound. RECOMMENDATION: Ultrasound-guided core biopsy of mass in the 11:30 o'clock location of the right breast. I have discussed the findings and recommendations with the patient. Results were also provided in writing at the conclusion of the visit. If applicable, a reminder letter will be sent to the patient regarding the next appointment. BI-RADS CATEGORY  4: Suspicious. Electronically Signed   By: Nolon Nations M.D.   On: 10/20/2016 09:00   Mm Clip Placement Left  Result Date: 11/11/2016 CLINICAL DATA:  Post MRI guided biopsy of an enhancing mass in the upper-outer left breast. EXAM: DIAGNOSTIC LEFT MAMMOGRAM POST MRI BIOPSY COMPARISON:  Previous exam(s). FINDINGS: Mammographic images were obtained following MRI guided biopsy of an enhancing mass in the upper-outer left breast. A dumbbell shaped biopsy marking clip is present and located at the site of the biopsied enhancing mass in the upper-outer left breast. IMPRESSION: Dumbbell shaped biopsy marking clip at site of biopsied enhancing mass in the upper-outer left breast. Final Assessment: Post Procedure Mammograms for Marker Placement Electronically Signed   By: Everlean Alstrom M.D.   On: 11/11/2016 08:56   Mm Clip Placement Right  Result Date: 10/20/2016 CLINICAL DATA:  Post ultrasound-guided core needle biopsy of right breast 11:30 o'clock mass. EXAM: DIAGNOSTIC RIGHT MAMMOGRAM POST ULTRASOUND BIOPSY COMPARISON:  Previous exam(s). FINDINGS: Mammographic images were obtained following ultrasound guided biopsy of right breast 11:30 o'clock mass. Two-view mammography demonstrates presence of ribbon shaped marker within the biopsy site in appropriate radiographic  position. IMPRESSION: Successful placement of ribbon shaped marker within the right breast, post ultrasound-guided core needle biopsy. Final Assessment: Post Procedure Mammograms for Marker Placement Electronically Signed   By: Fidela Salisbury M.D.   On: 10/20/2016 10:27   Mr Aundra Millet Breast Bx Johnella Moloney Dev 1st Lesion Image Bx Spec Mr Guide  Result Date: 11/11/2016 CLINICAL DATA:  76 year old female with recently diagnosed right breast cancer presents for MRI guided biopsy of an enhancing mass in the upper-outer left breast seen on recent MRI dated 11/01/2016. EXAM: MRI GUIDED CORE NEEDLE BIOPSY OF THE LEFT BREAST TECHNIQUE: Multiplanar, multisequence MR imaging of the left breast was performed both before and after administration of intravenous contrast. CONTRAST:  74m MULTIHANCE GADOBENATE DIMEGLUMINE 529 MG/ML IV SOLN COMPARISON:  Previous exams. FINDINGS: I met with the patient, and we discussed the procedure of MRI guided biopsy, including risks, benefits, and alternatives. Specifically, we discussed the risks of infection, bleeding, tissue injury, clip migration, and inadequate sampling. Informed, written consent was given. The usual time out protocol was performed immediately prior to the procedure.  Using sterile technique, 1% Lidocaine, MRI guidance, and a 9 gauge vacuum assisted device, biopsy was performed of the enhancing mass in the upper-outer left breast using a lateral to medial approach. At the conclusion of the procedure, a dumbbell shaped tissue marker clip was deployed into the biopsy cavity. Follow-up 2-view mammogram was performed and dictated separately. IMPRESSION: MRI guided biopsy of the enhancing mass in the upper-outer left breast. No apparent complications. Electronically Signed   By: Everlean Alstrom M.D.   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 This was found to be concordant by Dr. Fidela Salisbury. Pathology results were discussed with the patient by telephone. The patient reported doing well after the biopsy with tenderness at the site. Post biopsy instructions and care were reviewed and questions were answered. The patient was encouraged to call [facility] for any additional concerns. Surgical consultation has been arranged with Dr. Alphonsa Overall at [facility], per patient request, on October 28, 2016. Pathology results reported by Terie Purser, RN on 10/21/2016. Electronically Signed   By: Fidela Salisbury M.D.   On: 10/21/2016 13:56   Result Date: 10/21/2016 CLINICAL DATA:  Right breast 11 o'clock mass. EXAM: ULTRASOUND GUIDED RIGHT BREAST CORE NEEDLE BIOPSY COMPARISON:  Previous exam(s). FINDINGS: I met with the patient and we discussed the procedure of ultrasound-guided biopsy, including benefits and alternatives. We discussed the high likelihood of a successful procedure. We discussed the risks of the procedure, including infection, bleeding, tissue injury, clip migration, and inadequate sampling. Informed written consent was given. The usual time-out protocol was performed immediately prior to the procedure. Lesion quadrant: Upper outer quadrant. Using sterile technique and 1% Lidocaine as local anesthetic, under direct ultrasound visualization, a 14 gauge spring-loaded device was used to perform biopsy of right breast 11 o'clock mass using a medial approach. At the conclusion of the procedure a ribbon shaped tissue marker clip was deployed into the biopsy cavity. Follow up 2 view mammogram was performed and dictated separately. IMPRESSION: Ultrasound guided biopsy of right breast. No apparent complications. Electronically Signed: By: Fidela Salisbury M.D. On: 10/20/2016 10:14    ASSESSMENT & PLAN: 76 y.o. postmenopausal woman, with  past medical history of coronary artery disease, hypertension, diabetes, CKD, presented with screening discovered bilateral breast cancer.  1.  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  -Giving the small size of post tumor, she would be a candidate for bilateral lumpectomy and sentinel lymph node biopsy. Given her advanced age, bilateral disease, I think is probably okay to do lymph node biopsy on right side only, to avoid lymph edema.  -We discussed risk of cancer recurrence after count. Surgical resection.  --I recommend a Oncotype Dx test on the right surgical sample and we'll make a decision about adjuvant chemotherapy based on the Oncotype result. Written material of this test was given to her. Due to her advanced age and multiple comorbidities, she may not be great candidate for chemotherapy, however patient is interested in the test results for information. -If her surgical sentinel lymph node node positive, I  recommend mammaprint for further risk stratification and guide adjuvant chemotherapy. -Giving the strong ER and PR expression in her postmenopausal status, I recommend adjuvant endocrine therapy with aromatase inhibitor for a total of 5-10 years to reduce the risk of cancer recurrence. Potential benefits and side effects were discussed with patient and she is interested. -She was also seen by radiation oncologist Dr. Lisbeth Renshaw last week.  -We also discussed the breast cancer surveillance after her surgery. She will continue annual screening mammogram, self exam, and a routine office visit with lab and exam with [place]. -I encouraged her to have healthy diet and exercise regularly.   2. Genetics -Due to her family history of breast cancer, she was referred to  genetics. she underwent genetic testing yesterday, results pending - If her BRCA mutations comes back positive, patient is willing to undergo b/l mastectomy.    3. HTN, DM, CAD -She'll continue medication and follow-up with her primary care physician   PLAN - genetic testing results pending -Patient is going to have bilateral lumpectomy, and possible sentinel lymph node biopsy with Dr. Lucia Gaskins soon  - oncocyte testing on the larger (or more aggressive type) surgical breast tumor if >1.0cm -Pt met our breast cancer navigator Dawn after her visit with me. -I plan to see her back after surgery, or after radiation if Oncotype shows low risk disease.  No orders of the defined types were placed in this encounter.   All questions were answered. The patient knows to call the clinic with any problems, questions or concerns. I spent 55 minutes counseling the patient face to face. The total time spent in the appointment was 60 minutes and more than 50% was on counseling.  This document serves as a record of services personally performed by Truitt Merle, MD. It was created on her behalf by Brandt Loosen, a trained medical scribe. The creation of this record is based on the scribe's personal observations and the provider's statements to them. This document has been checked and approved by the attending provider.     Truitt Merle, MD 11/18/2016

## 2016-11-19 ENCOUNTER — Encounter: Payer: Self-pay | Admitting: Hematology

## 2016-11-19 ENCOUNTER — Other Ambulatory Visit: Payer: Self-pay | Admitting: Surgery

## 2016-11-19 DIAGNOSIS — Z170 Estrogen receptor positive status [ER+]: Principal | ICD-10-CM

## 2016-11-19 DIAGNOSIS — C50411 Malignant neoplasm of upper-outer quadrant of right female breast: Secondary | ICD-10-CM

## 2016-11-19 DIAGNOSIS — C50412 Malignant neoplasm of upper-outer quadrant of left female breast: Secondary | ICD-10-CM

## 2016-11-22 ENCOUNTER — Telehealth: Payer: Self-pay | Admitting: Hematology

## 2016-11-22 NOTE — Telephone Encounter (Signed)
No 8/23 los 

## 2016-11-23 ENCOUNTER — Encounter: Payer: Self-pay | Admitting: Neurology

## 2016-11-23 ENCOUNTER — Ambulatory Visit (INDEPENDENT_AMBULATORY_CARE_PROVIDER_SITE_OTHER): Payer: Medicare Other | Admitting: Neurology

## 2016-11-23 DIAGNOSIS — B0229 Other postherpetic nervous system involvement: Secondary | ICD-10-CM | POA: Diagnosis not present

## 2016-11-23 DIAGNOSIS — I2510 Atherosclerotic heart disease of native coronary artery without angina pectoris: Secondary | ICD-10-CM

## 2016-11-23 DIAGNOSIS — Z9861 Coronary angioplasty status: Secondary | ICD-10-CM

## 2016-11-23 MED ORDER — TOPIRAMATE 25 MG PO TABS: 25.0000 mg | ORAL_TABLET | Freq: Two times a day (BID) | ORAL | 0 refills | Status: DC

## 2016-11-23 MED ORDER — TOPIRAMATE 25 MG PO TABS: 25.0000 mg | ORAL_TABLET | Freq: Two times a day (BID) | ORAL | 3 refills | Status: DC

## 2016-11-23 NOTE — Patient Instructions (Signed)
I had a long discussion with the patient and husband regarding her mild cognitive impairment which appears to be stable. Continue Cerefolin NAC and increased participation in  cognitively challenging activities like solving crossword puzzles, playing sudoku and bridge. 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 Recommend trial of Topamax 25 mg twice daily to be increased as tolerated. Continue gabapentin 600 mg at night. She may use   oxycodone  As needed but I advised her to limit the dose to avoid side effects. She will return for follow-up in the future in 3 months with my nurse practitioner  or call earlier if necessary.  Postherpetic Neuralgia Postherpetic neuralgia (PHN) is nerve pain that occurs after a shingles infection. Shingles is a painful rash that appears on one side of the body, usually on your trunk or face. Shingles is caused by the varicella-zoster virus. This is the same virus that causes chickenpox. In people who have had chickenpox, the virus can resurface years later and cause shingles. You may have PHN if you continue to have pain for 3 months after your shingles rash has gone away. PHN appears in the same area where you had the shingles rash. For most people, PHN goes away within 1 year. Getting a vaccination for shingles can prevent PHN. This vaccine is recommended for people older than 50. It may prevent shingles and may also lower your risk of PHN if you do get shingles. What are the causes? PHN is caused by damage to your nerves from the varicella-zoster virus. This damage makes your nerves overly sensitive. What increases the risk? Aging is the biggest risk factor for developing PHN. Most people who get PHN are older than 53. Other risk factors include:  Having very bad pain before your shingles rash starts.  Having a very bad rash.  Having shingles in the nerve that supplies your face and eye (trigeminal nerve).  What are the signs  or symptoms? Pain is the main symptom of PHN. The pain is often very bad and may be described as stabbing, burning, or feeling like an electric shock. The pain may come and go or may be there all the time. Pain may be triggered by light touches on the skin or changes in temperature. You may have itching along with the pain. How is this diagnosed? Your health care provider may diagnose PHN based on your symptoms and your history of shingles. Lab studies and other diagnostic tests are usually not needed. How is this treated? There is no cure for PHN. Treatment for PHN will focus on pain relief. Over-the-counter pain relievers do not usually relieve PHN pain. You may need to work with a pain specialist. Treatment may include:  Antidepressant medicines to help with pain and improve sleep.  Antiseizure medicines to relieve nerve pain.  Strong pain relievers (opioids).  A numbing patch worn on the skin (lidocaine patch).  Follow these instructions at home: It may take a long time to recover from PHN. Work closely with your health care provider, and have a good support system at home.  Take all medicines as directed by your health care provider.  Wear loose, comfortable clothing.  Cover sensitive areas with a dressing to reduce friction from clothing rubbing on the area.  If cold does not make your pain worse, try applying a cool compress or cooling gel pack to the area.  Talk to your health care provider if you feel depressed or desperate. Living with long-term pain can be  depressing.  Contact a health care provider if:  Your medicine is not helping.  You are struggling to manage your pain at home. This information is not intended to replace advice given to you by your health care provider. Make sure you discuss any questions you have with your health care provider. Document Released: 06/05/2002 Document Revised: 08/21/2015 Document Reviewed: 03/06/2013 Elsevier Interactive Patient  Education  [employer].

## 2016-11-24 ENCOUNTER — Telehealth: Payer: Self-pay | Admitting: Neurology

## 2016-11-24 ENCOUNTER — Other Ambulatory Visit: Payer: Self-pay

## 2016-11-24 MED ORDER — TOPIRAMATE 25 MG PO TABS: 25.0000 mg | ORAL_TABLET | Freq: Two times a day (BID) | ORAL | 1 refills | Status: DC

## 2016-11-24 NOTE — Telephone Encounter (Signed)
Pt's husband called said he rec'd a call from [facility] and was advised they had rec'd RX for topiramate (TOPAMAX) 25 MG tablet but RX was not written as pt needs it to be.  It should be #90 with 3 refills. Please resend to [facility]

## 2016-11-24 NOTE — Telephone Encounter (Signed)
Refill done for patient until next appt time, did 90 days and one refill. This is a new medication for patient can only do 1 refill. Pt will be evaluated again by Dr. Leonie Man in 02/2017.

## 2016-11-24 NOTE — Progress Notes (Signed)
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  Update 12/06/2012 (PS): [name] returns for f/u after last vist 06/07/2012. She has stopped cerefolin nac as insurance is not paying for it. Memory loss seems stable but she feels subjectively she was doing better on Cerefolin nac. She recently fell of a tractor in her yard and hurt her foot but is improving. She continues to have poor recent memory but is able to recall later. She decided not to do expedition 3 study upon advise from [name] her cardiologist.   Update 01/10/2014 ; she returns for followup after last visit 6 months ago who. She is scheduled in about the same. She continues to have mild short-term memory difficulties unchanged. She has been taking several daily and finds it quite useful. She saw primary physician Dr. Gerarda Fraction who suggested she stop the Depakote. 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 She used to also have some light sensitivity and possibly migrainous phenomena which also seemed to be controlled on Depakote. She underwent right shoulder surgery 2 weeks ago which went well and she has no complaints. She was hospitalized briefly twice for syncopal episodes and she thought to be vasovagal related to a diarrhea episode and dehydration. Update 07/12/2014 : She returns for follow-up after last visit 6 months ago. He is accompanied by her husband . She states some memory difficulties unchanged. She reads a lot particularly on religion. She has a difficult time remembering the day and the date but has not used good compensatory strategies for this. She remains on Cerefolin which she likes. She  has noted increasing symptoms of restless legs and at times has to take 2 tablets on Mirapex which work well. She remains on Depakote as she feels it helps her headaches and vision difficulties. She states her blood pressure is normally quite high but she has some whitecoat hypertension blood pressure is elevated today at 140/100. UPDATE 04/27/2017CM Kristen. [name], 76 year old female returns for six-month follow-up. 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 She reports that she has good and bad days when she is not feeling her best she stays at home and keeps to herself. She continues to cook and drive and perform all activities of daily living. She reports that she has not gotten lost in familiar places. Her most recent hemoglobin A1c was 5.2 No further syncopal episodes since stopping Depakote . She remains on Cerefolin which she feels is beneficial for her neuropathy and her memory. She is also on Mirapex for restless legs.  She returns for reevaluation  UPDATE 01/22/16 CM Kristen. [name], 76 year old female returns for follow-up she has a history of mild cognitive impairment and memory score is stable today at 30/30. She remains on Cerefolin. She continues to cook and perform activities of daily living. She claims her blood sugars are in good control.She has restless leg syndrome which has gotten mildly worse in the left leg worse since she now has a stress fracture to her left foot and is in a boot. She had a fall at home. She has not had further syncopal episodes since stopping Depakote last year. She returns for reevaluation Update 11/23/2016 : She returns for  follow-up after last visit 10 months ago. She states her memory difficulties remain about unchanged however she's had an episode of shingles in February this year followed by severe postherpetic neuralgic pain which still persists. She has daily almost constant pain and she has sensitivity to touch beneath her right shoulder and upper  back. She has been taking gabapentin at night and during the day she takes oxycodone which does take the edge off but makes her sleepy and drowsy. She has not tried other medication like Topamax and Lyrica. She continues to take senna fallen and does participate in cognitively challenging activities and feels some memory difficulties are unchanged. On the Mini-Mental status exam today she scored 26/30 with deficits in orientation and attention. She was able to name 11 animals with 4 legs. Clock drawing was 4/4. REVIEW OF SYSTEMS: Full 14 system review of systems performed and notable only for those listed, all others are neg:   Fatigue, excessive sweating, excessive thirst, leg swelling, restless leg, insomnia, frequent waking, daytime sleepiness, bladder urgency, joint pain and swelling, skin moles, easy bruising and bleeding, dizziness, headache, weakness, agitation, depression, nervousness and anxiety and all other systems negative  ALLERGIES: Allergies  Allergen Reactions  . Lipitor [Atorvastatin] Other (See Comments)    Bone and muscle pain  . Nsaids Swelling  . Reglan [Metoclopramide]     Tardive dyskinesia; paradoxical reaction not relieved by benadryl  . Ultram [Tramadol] Other (See Comments)    Pt has seizure activity with this medication    HOME MEDICATIONS: Outpatient Medications Prior to Visit  Medication Sig Dispense Refill  . allopurinol (ZYLOPRIM) 100 MG tablet Take 1 tablet (100 mg total) by mouth daily. 30 tablet 6  . Biotin w/ Vitamins C & E (HAIR SKIN & NAILS GUMMIES PO) Take 2 tablets by mouth daily.     . bumetanide (BUMEX) 2 MG tablet Take 2 mg by mouth daily.    . busPIRone (BUSPAR) 7.5 MG tablet Take 7.5 mg by mouth 2 (two) times daily.    . Calcium Carb-Cholecalciferol (CALCIUM 600 + D PO) Take 1 tablet by mouth at bedtime.     . Cholecalciferol 2000 units CAPS Take 2,000 Units by mouth daily.    . COD LIVER OIL PO Take 415 mg by mouth daily.    . colchicine 0.6 MG  tablet Take one tablet daily during acute gout attack for 3 days, discontinue after this. Resume in the event of another acute gout flare up. 30 tablet 5  . CRANBERRY PO Take 1 capsule by mouth daily.    . Fish Oil-Krill Oil (KRILL OIL PLUS PO) Take 1 capsule by mouth daily.     Marland Kitchen gabapentin (NEURONTIN) 300 MG capsule Take 600 mg by mouth at bedtime.     . Iron-FA-B Cmp-C-Biot-Probiotic (FUSION PLUS) CAPS takes 1 tablet daily  9  . LORazepam (ATIVAN) 0.5 MG tablet Take 0.5 mg by mouth 4 (four) times daily.    Marland Kitchen losartan (COZAAR) 100 MG tablet take 1 tablet daily  3  . Melatonin 5 MG CAPS Take 5 mg by mouth at bedtime.    . Methylfol-Methylcob-Acetylcyst (CEREFOLIN NAC) 6-2-600 MG TABS TAKE 1 CAPLET BY MOUTH ONCE DAILY. 90 each 1  . metoprolol succinate (TOPROL XL) 50 MG 24 hr tablet Take 0.5 tablet (25 mg total) by mouth daily. 45 tablet 3  . nitroGLYCERIN (NITROLINGUAL) 0.4 MG/SPRAY spray Place 1 spray under the tongue every 5 (five) minutes x 3 doses as needed for chest  pain.     . pantoprazole (PROTONIX) 40 MG tablet TAKE 1 TABLET DAILY (CONTACT OFFICE FOR ADDITIONAL REFILLS, FINAL ATTEMPT) 90 tablet 0  . pramipexole (MIRAPEX) 0.25 MG tablet Take .25 mg at 4pm and 2 tabs (0.5mg )at bedtime 270 tablet 1  . Resveratrol (RESERVAPAK PO) Take 1 tablet by mouth every evening.     . rosuvastatin (CRESTOR) 20 MG tablet Take 20 mg by mouth at bedtime.    . clopidogrel (PLAVIX) 75 MG tablet Take 1 tablet (75 mg total) by mouth daily. (Patient not taking: Reported on 11/23/2016) 90 tablet 3  . diphenhydrAMINE (BENADRYL) 25 MG tablet Take 1 tablet (25 mg total) by mouth every 6 (six) hours. (Patient not taking: Reported on 11/23/2016) 20 tablet 0  . diphenhydrAMINE (BENADRYL) 50 MG tablet Take 50 mg by mouth at bedtime as needed for itching.    . predniSONE (DELTASONE) 50 MG tablet Take 1 tablet (50 mg total) by mouth daily with breakfast. (Patient not taking: Reported on 11/23/2016) 5 tablet 0   No  facility-administered medications prior to visit.     PAST MEDICAL HISTORY: Past Medical History:  Diagnosis Date  . Anxiety   . Arthritis     osteoarthritis  . CAD S/P percutaneous coronary angioplasty 2007; March 2011   [facility] 3.0 mm 20 mm postdilated 3.6 mm in early mid LAD; status post ISR Cutting Balloon PTCA and March 11 along with PCI of distal mid lesion with a 3.0 mm 12 mm MultiLink vision BMS; the proximal stent causes jailing of SP1 and SP2 with ostial 70-80% lesions  . Cancer (HCC)    Melanoma, Squamous cell Carcinoma  . CHF (congestive heart failure) ([place])   . Chronic kidney disease   . Dementia    mild  . Diabetes mellitus type 2 with neurological manifestations ([place])   . Dyslipidemia, goal LDL below 70   . Full dentures   . GERD (gastroesophageal reflux disease)   . Headache(784.0)   . History of hematuria    Followed by Dr. Gaynelle Arabian  . Hypertension, essential, benign   . Nausea & vomiting 10/2015  . Peripheral neuropathy   . Stroke ([place])   . TIA (transient ischemic attack)    multiple in the past  . Wears glasses     PAST SURGICAL HISTORY: Past Surgical History:  Procedure Laterality Date  . ABDOMINAL HYSTERECTOMY    . APPENDECTOMY    . BLADDER SUSPENSION    . CARDIAC CATHETERIZATION  02/24/2006   85% stenosis in the proximal portion of LAD-arrangements made for PCI on 02/25/2006  . CARDIAC CATHETERIZATION  02/25/2006   80% LAD lesion stented with a 3x61mm Liberte stent resulting in reduction of 80% lesion to 0% residual  . CARDIAC CATHETERIZATION  04/26/2006   Medical management  . CARDIAC CATHETERIZATION  06/24/2009   60-70% re-stenosis in the proximal LAD. A 3.25x15 cutting balloon, 3 inflations - 14atm-38sec, 13atm-39sec, and 12atm-40sec reduced to less than 10%. 60% stenosis of the mid/distal LAD stented with a 3x46mm Multilink stent.  Marland Kitchen CARDIAC CATHETERIZATION  07/09/2009   Medical management  . CAROTID DOPPLER  06/24/2009   40-59% R ICA  stenosis. No significant ICA stenosis noted  . CHOLECYSTECTOMY    . JOINT REPLACEMENT Left   . LESION REMOVAL Right 03/11/2015   Procedure:  EXCISION OF RIGHT PRE TIBIAL LESION;  Surgeon: Judeth Horn, MD;  Location: [facility];  Service: General;  Laterality: Right;  . MASS EXCISION Left 06/10/2014  Procedure: EXCISION LEFT LOWER LEG LESION;  Surgeon: Doreen Salvage, MD;  Location: [facility];  Service: General;  Laterality: Left;  Marland Kitchen MASS EXCISION Left 09/05/2015   Procedure: EXCISION OF LEFT FOREARM  MASS;  Surgeon: Judeth Horn, MD;  Location: [facility];  Service: General;  Laterality: Left;  . NM MYOVIEW LTD  11/16/2011   5 beats a PVC during recovery.  No skin or infarction.  Reached 5 METs, EKG negative for ischemia   . SHOULDER ARTHROSCOPY  10/15  . SHOULDER ARTHROSCOPY  1995   left  . TENDON REPAIR Left 05/12/2016   Procedure: Left Anterior Tibial Tendon Reconstruction;  Surgeon: Newt Minion, MD;  Location: [facility];  Service: Orthopedics;  Laterality: Left;  . TOTAL KNEE ARTHROPLASTY  2005   left  . TRANSTHORACIC ECHOCARDIOGRAM  10/2015   EF 55-60 %. Mild LVH. Mild pulmonary hypertension. Normal valves.  . TRANSTHORACIC ECHOCARDIOGRAM  12/31/2009/July 2015   IsLV size & function, Gr 1 DD w/ mild TR.;; b)09/2013: 55-60%. Grade 1 DD, mild aortic sclerosis  . WRIST ARTHROPLASTY  2010   cancer lt wrist    FAMILY HISTORY: Family History  Problem Relation Age of Onset  . Diabetes Mother   . Hypertension Mother   . Kidney disease Mother   . Hypertension Father   . Emphysema Father   . Heart attack Father   . Heart disease Father   . Arthritis Sister   . Diabetes Sister   . Hypertension Sister   . Breast cancer Sister 70       treated with mastectomy  . Cervical cancer Sister 21  . Hypertension Brother   . Hyperlipidemia Brother   . Diabetes Brother   . Stroke Brother   . Diabetes Sister   . Hypertension Sister   . Stomach cancer Sister  61  . Hypertension Brother   . ALS Brother        d.62s  . Breast cancer Daughter 79       triple negative treated with lumpectomy, chemo, radiation  . Heart attack Daughter 36       d.45  . COPD Daughter   . Cancer Paternal Aunt        unspecified type  . Cancer Paternal Uncle        unspecified type  . Cancer Paternal Uncle        unspecified type    SOCIAL HISTORY: Social History   Social History  . Marital status: Married    Spouse name: [name]  . Number of children: 3  . Years of education: 44   Occupational History  . retired Retired   Social History Main Topics  . Smoking status: Never Smoker  . Smokeless tobacco: Never Used  . Alcohol use No  . Drug use: No  . Sexual activity: No   Other Topics Concern  . Not on file   Social History Narrative   She is a married mother of 78, grandmother of 2.  Usually very active and out about the house.  But currently over last 6-8 weeks has been incapacitated.  She never smoked and does not drink alcohol.     PHYSICAL EXAM  There were no vitals filed for this visit. There is no height or weight on file to calculate BMI. General: Frail elderly Caucasian seated, in no evident distress  Head: head normocephalic and atraumatic.   Neck: supple with no carotid  bruits  Cardiovascular: regular rate and  rhythm, no murmurs  Musculoskeletal: no deformity  e Skin: no rash/petichiae   Neurologic Exam  Mental Status: Awake and fully alert. Oriented to place and time. Recent and remote memory intact. Attention span, concentration and fund of knowledge appropriate. Mood and affect appropriate.  MMSE 26/30 with deficits in orientation and attention. Animal Naming 11. Clock drawing 4/4.  Cranial Nerves: Pupils equal, briskly reactive to light. Extraocular movements full without nystagmus. Visual fields full to confrontation. Hearing intact. Facial sensation intact. Face, tongue, palate moves normally and symmetrically.  Motor:  Normal bulk and tone. Normal strength in all tested extremity muscles. Mild head tremor at rest.  Sensory: intact to touch , decreased pinprick to midshin decreased vibratory to ankles.  Coordination: Rapid alternating movements normal in all extremities. Finger-to-nose and heel-to-shin performed accurately bilaterally.  Gait and Station: Arises from chair without difficulty. Stance is wide based.. Gait demonstrates normal stride length and balance . Unable to heel, toe or  tandem walk .No assistive device left lower extremity boot in place for stress fracture Reflexes: 1+ and symmetric.  DIAGNOSTIC DATA (LABS, IMAGING, TESTING) - I reviewed patient records, labs, notes, testing and imaging myself where available.  Lab Results  Component Value Date   WBC 9.0 11/13/2016   HGB 12.2 11/13/2016   HCT 35.6 (L) 11/13/2016   MCV 89.9 11/13/2016   PLT 242 11/13/2016      Component Value Date/Time   NA 137 11/13/2016 1929   K 4.3 11/13/2016 1929   CL 103 11/13/2016 1929   CO2 25 11/13/2016 1929   GLUCOSE 111 (H) 11/13/2016 1929   BUN 23 (H) 11/13/2016 1929   CREATININE 1.87 (H) 11/13/2016 1929   CREATININE 0.94 01/09/2013 0839   CALCIUM 9.7 11/13/2016 1929   PROT 7.1 11/13/2016 1929   ALBUMIN 3.8 11/13/2016 1929   AST 25 11/13/2016 1929   ALT 19 11/13/2016 1929   ALKPHOS 68 11/13/2016 1929   BILITOT 0.3 11/13/2016 1929   GFRNONAA 25 (L) 11/13/2016 1929   GFRAA 29 (L) 11/13/2016 1929     ASSESSMENT AND PLANI  76 y.o. 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 Likely restless leg syndrome, Peripheral neuropathy (HCC)  Chronic kidney disease; Headache(784.0); gait abnormality . New complains of postherpetic neuralgia following shingles in February 2018 PLAN:  had a long discussion with the patient and husband regarding her mild cognitive impairment which appears to be stable.  Continue Cerefolin NAC and increased participation in  cognitively challenging activities like solving crossword puzzles, playing sudoku and bridge. 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 Recommend trial of Topamax 25 mg twice daily to be increased as tolerated. Continue gabapentin 600 mg at night. She may use   oxycodone  As needed but I advised her to limit the dose to avoid side effects. Greater than 50% time during this 30 minute visit was spent on counseling and coordination of care about her postherpetic neuralgic pain mild cognitive impairment and answering questions She will return for follow-up in the future in 3 months with my nurse practitioner  or call earlier if necessary.   Antony Contras, MD [facility] [address] [place], [place] [address] [phone]

## 2016-11-26 ENCOUNTER — Telehealth: Payer: Self-pay | Admitting: Genetics

## 2016-11-26 NOTE — Telephone Encounter (Signed)
Reviewed that the 9-gene high/moderate breast risk STAT panel performed by [facility] was negative for mutations. Will reflex to remaining genes included on [facility]'s 46-gene Common Hereditary Cancer panel. Will call patient when remaining results are available. Final risk assessment and documentation will be made at that time. A portion of her STAT panel results are included below for reference.  

## 2016-12-03 ENCOUNTER — Encounter: Payer: Self-pay | Admitting: Genetics

## 2016-12-03 ENCOUNTER — Ambulatory Visit: Payer: Self-pay | Admitting: Genetics

## 2016-12-03 ENCOUNTER — Telehealth: Payer: Self-pay | Admitting: Genetics

## 2016-12-03 DIAGNOSIS — Z1379 Encounter for other screening for genetic and chromosomal anomalies: Secondary | ICD-10-CM

## 2016-12-03 HISTORY — DX: Encounter for other screening for genetic and chromosomal anomalies: Z13.79

## 2016-12-03 NOTE — Telephone Encounter (Signed)
Reviewed that germline genetic testing revealed no pathogenic mutations. This is considered to be a negative result. Testing was performed through [facility]'s Common Hereditary Cancers Panel + [facility]'s Melanoma Panel. This custom panel includes analysis of the following 51 genes: APC, ATM, AXIN2, BAP1, BARD1, BMPR1A, BRCA1, BRCA2, BRIP1, CDH1, CDK4, CDKN2A, CHEK2, CTNNA1, DICER1, EPCAM, GREM1, HOXB13, KIT, MEN1, MITF, MLH1, MSH2, MSH3, MSH6, MUTYH, NBN, NF1, NTHL1, PALB2, PDGFRA, PMS2, POLD1, POLE, POT1, PTEN, RAD50, RAD51C, RAD51D, RB1, SDHA, SDHB, SDHC, SDHD, SMAD4, SMARCA4, STK11, TP53, TSC1, TSC2, and VHL.  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 Discussed that this VUS should not change clinical management.  For more detailed discussion, please see genetic counseling documentation from 12/03/2016. Result report dated 12/03/2016.

## 2016-12-03 NOTE — Progress Notes (Signed)
HPI: [name] was previously seen in the [facility] on 11/16/2016 due to a personal and family history of cancer and concerns regarding a hereditary predisposition to cancer. Please refer to our prior cancer genetics clinic note for more information regarding [name]'s medical, social and family histories, and our assessment and recommendations, at the time. [name] recent genetic test results were disclosed to her, as were recommendations warranted by these results. These results and recommendations are discussed in more detail below.  CANCER HISTORY:  In July 2018, at the age of 76, 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 In August 2018, she was found to have invasive ductal carcinoma of the left breast as well. Her treatment for both breast cancers is pending. 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 [name]'s health history is significant for hysterectomy and bilateral salpingo-oophorectomy by age 68 due to an ectopic pregnancy and ovarian cyst. 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 She reports that she used Premarin from the time of this surgery until the time of her breast cancer diagnosis.  Oncology History   Cancer Staging Malignant 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, estrogen receptor positive ([place]) Staging form: Breast, AJCC 8th Edition - Clinical: Stage IA (cT1c, cN0, cM0, G1, ER: Positive, PR: Positive, HER2: Negative) - Unsigned       Malignant 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, estrogen receptor positive ([place])   10/20/2016 Initial Biopsy    Diagnosis Breast, right, needle core biopsy, 11:30 o'clock - INVASIVE DUCTAL CARCINOMA,      10/20/2016 Initial Diagnosis    Malignant 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, estrogen receptor positive ([place])      10/20/2016 Receptors her2    Estrogen Receptor: 100%, POSITIVE, STRONG STAINING INTENSITY Progesterone Receptor: 100%, POSITIVE, STRONG STAINING INTENSITY Proliferation Marker Ki67: 5% HER2 NEGATIVE       10/20/2016 Mammogram    Diagnostic mammogram showed persistent distortion corresponds to a vaguely palpable irregular mass and is suspicious for malignancy.Targeted ultrasound is performed, showing an irregular hypoechoic mass with posterior acoustic shadowing in the 11:30 o'clock location of the right breast 4 cm from the nipple which measures 0.9 x 0.9 x0.8 cm. There is associated internal vascularity. Evaluation of the axilla is negative for adenopathy.      11/01/2016 Imaging    B/l BREAST MRI: IMPRESSION: 1. There is a 1.5 cm biopsy proven malignancy in the upper slightly outer quadrant of the right breast. No additional sites of disease are identified.  2. There is an indeterminate 6 mm enhancing mass in the upper-outer left breast.      11/11/2016 Pathology Results    Diagnosis Breast, left, needle core biopsy, upper outer - INVASIVE DUCTAL CARCINOMA. - ATYPICAL LOBULAR HYPERPLASIA. - FIBROCYSTIC CHANGES AND PSEUDOANGIOMATOUS STROMAL HYPERPLASIA ([place]).      11/11/2016 Receptors her2     Estrogen Receptor: 100%, POSITIVE, STRONG STAINING INTENSITY Progesterone Receptor: 10%, POSITIVE, WEAK STAINING INTENSITY Proliferation Marker Ki67: 2% HER2 NEGATIVE       12/03/2016 Genetic Testing    Germline genetic testing was performed through [facility]'s Common Hereditary Cancers Panel + [facility]'s Melanoma Panel. This custom panel includes analysis of the following 51 genes: APC, ATM, AXIN2, BAP1, BARD1, BMPR1A, BRCA1, BRCA2, BRIP1, CDH1, CDK4, CDKN2A, CHEK2, CTNNA1, DICER1, EPCAM, GREM1, HOXB13, KIT,  MEN1, MITF, MLH1, MSH2, MSH3, MSH6, MUTYH, NBN, NF1, NTHL1, PALB2, PDGFRA, PMS2, POLD1, POLE, POT1, PTEN, RAD50, RAD51C, RAD51D, RB1, SDHA, SDHB, SDHC, SDHD, SMAD4, SMARCA4, STK11, TP53,  TSC1, TSC2, and VHL.  A variant of uncertain significance (VUS) was noted in BARD1.         FAMILY HISTORY:  We obtained a detailed, 4-generation family history.  Significant diagnoses are listed below: Family History  Problem Relation Age of Onset  . Diabetes Mother   . Hypertension Mother   . Kidney disease Mother   . Hypertension Father   . Emphysema Father   . Heart attack Father   . Heart disease Father   . Arthritis Sister   . Diabetes Sister   . Hypertension Sister   . Breast cancer Sister 40       treated with mastectomy  . Cervical cancer Sister 18  . Hypertension Brother   . Hyperlipidemia Brother   . Diabetes Brother   . Stroke Brother   . Diabetes Sister   . Hypertension Sister   . Stomach cancer Sister 62  . Hypertension Brother   . ALS Brother        d.62s  . Breast cancer Daughter 44       triple negative treated with lumpectomy, chemo, radiation  . Heart attack Daughter 41       d.45  . COPD Daughter   . Cancer Paternal Aunt        unspecified type  . Cancer Paternal Uncle        unspecified type  . Cancer Paternal Uncle        unspecified type   [name] had three daughters. Two are deceased. Her daughter, [name], died at age 17 from a heart attack and also had a history of COPD. Her daughter, [name], died at age 68 in a car accident. [name] had a daughter, [name], who [name] raised as her own daughter and is now 30. [name] third daughter, [name], was diagnosed with breast cancer at age 92. Her cancer is reported to have been triple negative and was treated with lumpectomy, chemotherapy, and radiation. [name] is currently 28 and is reportedly doing well.   [name] has two brothers and two sisters. Her sister, [name], had cervical cancer in her 4s and breast cancer in her late-20s or early-30s. Her breast cancer was treated with mastectomy. [name] reports that [name] did have bone metastases from her breast cancer, but was recently told that she  has no evidence of disease and responded well to a pill. [name] has a son and daughter with unspecified forms of cancer. [name] other sister, Bethena Roys, had "stomach" cancer that [name] reports was "on the inside of her intestine" in her late-60s. Bethena Roys is currently 35 and is doing well, though [name] reports that Bethena Roys has to puree all of her food. [name] brother, [name], recently died of ALS in his early-60s. Her other brother, [name], is 3 without cancers. Jimmy's daughter has an unspecified form of cancer.   [name]'s mother died at 38 from a heart attack. Her mother had "tons" of siblings and [name] reports that many died before she was born. [name] does not know of any specific cancers in these family members. Both maternal grandparents are deceased, but ages at death and cause of death are not known.  [name]'s father died at 31 from a heart attack. Her father also had many siblings, but  she does not know the exact number. She reports two uncles and an aunt died from unspecified forms of cancer. One of the uncles had 71 children and all but one died of cancer. Both of [name]'s paternal grandparents are deceased, but ages at death and cause of death are not known.   Ms. Kahler is unaware of previous family history of genetic testing for hereditary cancer risks. Patient's maternal ancestors are of English/Scotch-Irish descent, and paternal ancestors are of [place] descent. There is no reported Ashkenazi Jewish ancestry. There is no known consanguinity.  GENETIC TEST RESULTS: Genetic testing performed through a custom panel that combined [facility]'s Common Hereditary Cancers Panel and [facility]'s Melanoma Panel reported out on 12/03/2016 showed no pathogenic mutations. This custom panel includes analysis of the following 51 genes: APC, ATM, AXIN2, BAP1, BARD1, BMPR1A, BRCA1, BRCA2, BRIP1, CDH1, CDK4, CDKN2A, CHEK2, CTNNA1, DICER1, EPCAM, GREM1, HOXB13, KIT, MEN1, MITF, MLH1, MSH2, MSH3, MSH6, MUTYH, NBN, NF1,  NTHL1, PALB2, PDGFRA, PMS2, POLD1, POLE, POT1, PTEN, RAD50, RAD51C, RAD51D, RB1, SDHA, SDHB, SDHC, SDHD, SMAD4, SMARCA4, STK11, TP53, TSC1, TSC2, and VHL.  A variant of uncertain significance (VUS) called BARD1 c.716T>A ([id number]) was noted. At this time, it is unknown if this variant is associated with increased cancer risk or if this is a normal finding, but most variants such as this get reclassified to being inconsequential. It should not be used to make medical management decisions. With time, we suspect the lab will determine the significance of this variant, if any. If we do learn more about it, we will try to contact Ms. Skiff to discuss it further. However, it is important to stay in touch with [place] periodically and keep the address and phone number up to date.  The test report will be scanned into EPIC and will be located under the Molecular Pathology section of the Results Review tab.A portion of the result report is included below for reference.       We discussed with Ms. Full that since the current genetic testing is not perfect, it is possible there may be a gene mutation in one of these genes that current testing cannot detect, but that chance is small. We also discussed, that it is possible that another gene that has not yet been discovered, or that we have not yet tested, is responsible for the cancer diagnoses in the family. Therefore, important to remain in touch with cancer genetics in the future so that we can continue to offer Ms. Kloth the most up to date genetic testing.   CANCER SCREENING RECOMMENDATIONS: This result indicates that it is unlikely Ms. Anand has an increased risk for a future cancer due to a mutation in one of these genes. Because no causative or actionable mutations were identified, it is recommended she continue to follow the cancer management and screening guidelines provided by her oncology and primary healthcare providers.   RECOMMENDATIONS FOR FAMILY  MEMBERS: Women in this family might be at some increased risk of developing breast cancer, over the general population risk, simply due to the family history of cancer. We recommended women in this family have a yearly mammogram beginning at age 24, or 91 years younger than the earliest onset of cancer, an annual clinical breast exam, and perform monthly breast self-exams. Women in this family should also have a gynecological exam as recommended by their primary provider. All family members should have a colonoscopy by age 39.  Based on Ms. Pulver's family history, we recommended her  daughter [name], who was diagnosed with breast cancer at age 29, have genetic counseling and testing if she has not done so already. Genetic testing is also indicated for Ms. Jennings's sister, [name], who was diagnosed with breast cancer in her late-20s to early-30s. Other family members may also be candidates for genetic testing and should consult their physicians and/or a genetic counselor if they are interested in learning more about their risks and options. Ms. Koeller will let us know if we can be of any assistance in coordinating genetic counseling and/or testing forfamily members.  FOLLOW-UP: Lastly, we discussed with Ms. Steely that cancer genetics is a rapidly advancing field and it is possible that new genetic tests will be appropriate for her and/or her family members in the future. We encouraged her to remain in contact with cancer genetics on an annual basis so we can update her personal and family histories and let her know of advances in cancer genetics that may benefit this family.   Our contact number was provided. Ms. Barrell questions were answered to her satisfaction, and she knows she is welcome to call us at anytime with additional questions or concerns.   Mal Misty, MS, [facility] Certified Naval [email or web address]

## 2016-12-06 NOTE — Pre-Procedure Instructions (Signed)
[name]  12/06/2016      [facility] [place], [place] [address] Phone: [phone] Fax: [phone]  [facility] [place], [place] [address] [place] [address] Phone: [phone] Fax: [phone]  [facility] [place], [place] [address] [place] [address] Phone: [phone] Fax: [phone]    Your procedure is scheduled on Tuesday, December 14, 2016  Report to [facility] Admitting Entrance "A" at 5:30 A.M.   Call this number if you have problems the morning of surgery:  [phone]   Remember:  Do not eat food or drink liquids after midnight.  Follow your doctor's instruction for taking your Plavix.   Take these medicines the morning of surgery with A SIP OF WATER: Allopurinol (ZYLOPRIM), BusPIRone (BUSPAR), Metoprolol succinate (TOPROL XL), Pantoprazole (PROTONIX), Topiramate (TOPAMAX). If needed NitroGLYCERIN (NITROLINGUAL) for chest pain (notify the nurse if you had to take this medicine).  7 days before surgery stop taking all Aspirins, Vitamins, Fish oils, and Herbal medications. Also stop all NSAIDS i.e. Advil, Motrin, Aleve, Anaprox, Naproxen, BC and Goody Powders.  How to Manage Your Diabetes Before and After Surgery  Why is it important to control my blood sugar before and after surgery? . Improving blood sugar levels before and after surgery helps healing and can limit problems. . A way of improving blood sugar control is eating a healthy diet by: o  Eating less sugar and carbohydrates o  Increasing activity/exercise o  Talking with your doctor about reaching your blood sugar goals . High blood sugars (greater than 180 mg/dL) can raise your risk of infections and slow your recovery, so you will need to focus on controlling your diabetes  during the weeks before surgery. . Make sure that the doctor who takes care of your diabetes knows about your planned surgery including the date and location.  How do I manage my blood sugar before surgery? . Check your blood sugar at least 4 times a day, starting 2 days before surgery, to make sure that the level is not too high or low. o Check your blood sugar the morning of your surgery when you wake up and every 2 hours until you get to the Short Stay unit. . If your blood sugar is less than 70 mg/dL, you will need to treat for low blood sugar: o Do not take insulin. o Treat a low blood sugar (less than 70 mg/dL) with  cup of clear juice (cranberry or apple), 4 glucose tablets, OR glucose gel. o Recheck blood sugar in 15 minutes after treatment (to make sure it is greater than 70 mg/dL). If your blood sugar is not greater than 70 mg/dL on recheck, call [phone] for further instructions. . Report your blood sugar to the short stay nurse when you get to Short Stay.  . If you are admitted to the hospital after surgery: o Your blood sugar will be checked by the staff and you will probably be given insulin after surgery (instead of oral diabetes medicines) to make sure you have good blood sugar levels. o The goal for blood sugar control after surgery is 80-180 mg/dL.   Do not wear jewelry, make-up or nail polish.  Do not wear lotions, powders, or perfumes, or deodorant.  Do  not shave 48 hours prior to surgery.    Do not bring valuables to the hospital.  [facility] is not responsible for any belongings or valuables.  Contacts, dentures or bridgework may not be worn into surgery.  Leave your suitcase in the car.  After surgery it may be brought to your room.  For patients admitted to the hospital, discharge time will be determined by your treatment team.  Patients discharged the day of surgery will not be allowed to drive home.   Special instructions:    [facility]- Preparing For  Surgery  Before surgery, you can play an important role. Because skin is not sterile, your skin needs to be as free of germs as possible. You can reduce the number of germs on your skin by washing with CHG (chlorahexidine gluconate) Soap before surgery.  CHG is an antiseptic cleaner which kills germs and bonds with the skin to continue killing germs even after washing.  Please do not use if you have an allergy to CHG or antibacterial soaps. If your skin becomes reddened/irritated stop using the CHG.  Do not shave (including legs and underarms) for at least 48 hours prior to first CHG shower. It is OK to shave your face.  Please follow these instructions carefully.   1. Shower the NIGHT BEFORE SURGERY and the MORNING OF SURGERY with CHG.   2. If you chose to wash your hair, wash your hair first as usual with your normal shampoo.  3. After you shampoo, rinse your hair and body thoroughly to remove the shampoo.  4. Use CHG as you would any other liquid soap. You can apply CHG directly to the skin and wash gently with a scrungie or a clean washcloth.   5. Apply the CHG Soap to your body ONLY FROM THE NECK DOWN.  Do not use on open wounds or open sores. Avoid contact with your eyes, ears, mouth and genitals (private parts). Wash genitals (private parts) with your normal soap.  6. Wash thoroughly, paying special attention to the area where your surgery will be performed.  7. Thoroughly rinse your body with warm water from the neck down.  8. DO NOT shower/wash with your normal soap after using and rinsing off the CHG Soap.  9. Pat yourself dry with a CLEAN TOWEL.   10. Wear CLEAN PAJAMAS   11. Place CLEAN SHEETS on your bed the night of your first shower and DO NOT SLEEP WITH PETS.  Day of Surgery: Do not apply any deodorants/lotions. Please wear clean clothes to the hospital/surgery center.    Please read over the following fact sheets that you were given. Pain Booklet, Coughing and Deep  Breathing and Surgical Site Infection Prevention

## 2016-12-07 ENCOUNTER — Encounter (HOSPITAL_COMMUNITY)
Admission: RE | Admit: 2016-12-07 | Discharge: 2016-12-07 | Disposition: A | Discharge status: Home / Self Care | Payer: Medicare Other | Source: Ambulatory Visit | Attending: Surgery | Admitting: Surgery

## 2016-12-07 ENCOUNTER — Encounter (HOSPITAL_COMMUNITY): Payer: Self-pay

## 2016-12-07 DIAGNOSIS — C50411 Malignant neoplasm of upper-outer quadrant of right female breast: Secondary | ICD-10-CM | POA: Diagnosis not present

## 2016-12-07 DIAGNOSIS — Z79899 Other long term (current) drug therapy: Secondary | ICD-10-CM | POA: Diagnosis not present

## 2016-12-07 DIAGNOSIS — Z170 Estrogen receptor positive status [ER+]: Secondary | ICD-10-CM | POA: Diagnosis not present

## 2016-12-07 DIAGNOSIS — Z7902 Long term (current) use of antithrombotics/antiplatelets: Secondary | ICD-10-CM | POA: Insufficient documentation

## 2016-12-07 DIAGNOSIS — Z01818 Encounter for other preprocedural examination: Secondary | ICD-10-CM | POA: Diagnosis not present

## 2016-12-07 DIAGNOSIS — C50412 Malignant neoplasm of upper-outer quadrant of left female breast: Secondary | ICD-10-CM | POA: Insufficient documentation

## 2016-12-07 HISTORY — DX: Malignant neoplasm of unspecified site of unspecified female breast: C50.919

## 2016-12-07 LAB — CBC
HCT: 38.5 % (ref 36.0–46.0)
Hemoglobin: 12.6 g/dL (ref 12.0–15.0)
MCH: 30.1 pg (ref 26.0–34.0)
MCHC: 32.7 g/dL (ref 30.0–36.0)
MCV: 91.9 fL (ref 78.0–100.0)
Platelets: 207 10*3/uL (ref 150–400)
RBC: 4.19 MIL/uL (ref 3.87–5.11)
RDW: 14.6 % (ref 11.5–15.5)
WBC: 6.4 10*3/uL (ref 4.0–10.5)

## 2016-12-07 LAB — HEMOGLOBIN A1C
Hgb A1c MFr Bld: 5.7 % — ABNORMAL HIGH (ref 4.8–5.6)
Mean Plasma Glucose: 116.89 mg/dL

## 2016-12-07 LAB — BASIC METABOLIC PANEL
Anion gap: 9 (ref 5–15)
BUN: 23 mg/dL — ABNORMAL HIGH (ref 6–20)
CO2: 25 mmol/L (ref 22–32)
Calcium: 10 mg/dL (ref 8.9–10.3)
Chloride: 105 mmol/L (ref 101–111)
Creatinine, Ser: 1.51 mg/dL — ABNORMAL HIGH (ref 0.44–1.00)
GFR calc Af Amer: 37 mL/min — ABNORMAL LOW (ref >60)
GFR calc non Af Amer: 32 mL/min — ABNORMAL LOW (ref >60)
Glucose, Bld: 119 mg/dL — ABNORMAL HIGH (ref 65–99)
Potassium: 4.9 mmol/L (ref 3.5–5.1)
Sodium: 139 mmol/L (ref 135–145)

## 2016-12-07 LAB — GLUCOSE, CAPILLARY: Glucose-Capillary: 108 mg/dL — ABNORMAL HIGH (ref 65–99)

## 2016-12-07 NOTE — Progress Notes (Addendum)
[facility]  Cardiologist - Dr. Dr. Tomma Rakers  Chest x-[place] - Denies  EKG - 11/14/16 (E)  Stress Test - 11/16/11 (E)  ECHO - 11/17/15 (E)  Cardiac Cath - 2007 & 2011 Stent x2  Sleep Study - Yes- Negative CPAP - None  Fasting Blood Sugar - 100- Today 108 Checks Blood Sugar __1___ times a day Pt controls BG with diet.  Chart will be given to anesthesia for review due to cardiac history. Pt sts she has stopped taking Plavix for 3 weeks for the up coming procedure. Pt given 8oz. Water for ERAS to drink by 0330 DOS.  Pt denies having chest pain, sob, or fever at this time. All instructions explained to the pt, with a verbal understanding of the material. Pt agrees to go over the instructions while at home for a better understanding. The opportunity to ask questions was provided.

## 2016-12-08 NOTE — Progress Notes (Signed)
Anesthesia Chart Review:  Pt is a 76 year old female scheduled for B breast lumpectomy with B radioactive seed and B axillary sentinel lymph node biopsy on 12/14/2016 with Alphonsa Overall, MD  - PCP is Redmond School, MD.  - Neurologist is Antony Contras, MD.  - Endocrinologist is Jacelyn Pi, MD - Cardiologist is Glenetta Hew, MD who cleared pt for surgery.  - Nephrologist is Elmarie Shiley, MD - Oncologist is Truitt Merle, MD  PMH includes:  CAD (s/p BMS to proximal LAD 2007; cutting balloon to LAD in stent restenosis, stent to distal LAD 05/2009), CHF, HTN, DM, hyperlipidemia, multiple TIAs, melanoma, mild cognitive impairment, GERD. Never smoker. BMI 28. S/p L anterior tibial tendon reconstruction 05/12/16.  Medications include: bumex, plavix, losartan, metformin, metoprolol, crestor. Pt to hold plavix and ASA 5-7 days before surgery  BP (!) 153/94   Pulse 82   Temp 36.8 C (Oral)   Resp 18   Ht 5' 6.5" (1.689 m)   Wt 175 lb 5 oz (79.5 kg)   SpO2 99%   BMI 27.87 kg/m    Preoperative labs reviewed.  HbA1c 5.7, glucose 119  EKG 11/13/16: sinus rhythm. Prolonged PR interval  Echo 11/17/15:  - Left ventricle: The cavity size was normal. Wall thickness wasincreased in a pattern of mild LVH. Systolic function was normal.The estimated ejection fraction was in the range of 55% to 60%.Wall motion was normal; there were no regional wall motionabnormalities. The study is not technically sufficient to allowevaluation of LV diastolic function. - Mitral valve: Mildly thickened leaflets . There was trivial regurgitation. - Left atrium: The atrium was normal in size. - Tricuspid valve: There was mild regurgitation. - Pulmonary arteries: PA peak pressure: 37 mm Hg (S). - Inferior vena cava: The vessel was dilated. The respirophasicdiameter changes were blunted (<50%), consistent with elevatedcentral venous pressure. - Pericardium, extracardiac: There was no pericardial effusion.Apical echo free  space overlying the apical RV, suggestive ofprobably fat pad.  Nuclear stress test 11/16/11: Normal myocardial perfusion study. This is a low risk scan. EF 70%.  Cardiac cath 06/30/09:  1. Widely patent proximal and mid LAD stents with evidence for 70% ostial narrowing in the small first septal perforating artery and 80% ostial narrowing in the second septal perforating artery which arose from the proximal LAD stented segment.  2. Normal LCx system. 3. 20% narrowing in the RCA.  If no changes, I anticipate pt can proceed with surgery as scheduled.   Willeen Cass, FNP-BC [facility] Short Stay Surgical Center/Anesthesiology Phone: [phone] 12/08/2016 3:35 PM

## 2016-12-13 ENCOUNTER — Ambulatory Visit
Admission: RE | Admit: 2016-12-13 | Discharge: 2016-12-13 | Disposition: A | Discharge status: Home / Self Care | Payer: Medicare Other | Source: Ambulatory Visit | Attending: Surgery | Admitting: Surgery

## 2016-12-13 DIAGNOSIS — C50411 Malignant neoplasm of upper-outer quadrant of right female breast: Secondary | ICD-10-CM

## 2016-12-13 DIAGNOSIS — Z170 Estrogen receptor positive status [ER+]: Principal | ICD-10-CM

## 2016-12-13 DIAGNOSIS — C50412 Malignant neoplasm of upper-outer quadrant of left female breast: Secondary | ICD-10-CM

## 2016-12-13 DIAGNOSIS — R928 Other abnormal and inconclusive findings on diagnostic imaging of breast: Secondary | ICD-10-CM | POA: Diagnosis not present

## 2016-12-13 NOTE — H&P (Signed)
[name]  Location: [facility] Patient #: [id number] DOB: 04/05/1940 Married / Language: English / Race: White Female  History of Present Illness   The patient is a 76 year old female who presents with a complaint of right breast cancer.   The PCP is Dr. Selena Batten  The patient was referred by Dr. Keturah Barre. Thomas Hoff.   Comes with husband, daughter, Levada Dy, and granddaughter, [name].  The patient had not had a mammogram. She has a family history of breast cancer, her sister and daughter had breast cancer. She has not noticed anything in her breast than usual. She is on chronic Premarin she's been on since a hysterectomy in the 1970s.  Mamm[facility]: [facility] - 0.9 cm mass in the 11:30 o'clock location of the right breast, Breast Density - C, no axillary adenopathy. Her last mammogram prior to this one was 3 years ago. Biopsy: 10/20/2016 ([id number]) - IDC, grade 1, ER - 100%, PR 100%, Ki67 - 5%, Her2Neu - neg Family history of breast or ovarian cancer: Sister and daughter had breast cancer On hormone therapy: She is on Premarin - she will stop this  I discussed the options for breast cancer treatment with the patient. Treatment of breast cancer includes medical oncology and radiation oncology. I discussed the surgical options of lumpectomy vs. mastectomy. If mastectomy, there is the possibility of reconstruction. I discussed the options of lymph node biopsy. The treatment plan depends on the pathologic staging of the tumor and the patient's personal wishes. The risks of surgery include, but are not limited to, bleeding, infection, the need for further surgery, and nerve injury. The patient has been given literature on the treatment of breast cancer.  Plan: 1) Cardiac clearance, 2) MRI of breast, 3) Medical and radiation oncology, 4) Genetics  Past Medical History: 1. 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  Cardiology Followed by Dr. Roni Bread BMS PCI to the LAD and follow-up PTCA of in-stent restenosis back in 2011. Relatively negative Myoview in 2013. 3. On Plavix 4. HTN 5. Hypercholesterolemia 6. Left anterior tibial tendon tear - M. 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 Left total knee repair - 2005 - D. Murphy 8. Urethral sling for urinary incontinence - 2010 Tannenbaum 9. 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 Leonie Man 10. DM - off all meds Followed by Dr. Chalmers Cater 11. Total hysterectomy - 1972  Social History: Married Daughter - Lynne Leader - had breast cancer, triple negative (Had Streck/Rubin/Murray) Granddaughter - Dickey Gave - had genetic test, negative  She had 13 pregnancies - only 3 children - all girls. One daughter died at age 82 in a car accident (she raised her daughter - who is now 25), one daughter died of a massive MI at age 36. So she only has one living daughter.  Allergies (April Staton, CMA; 10/28/2016 8:32 AM) Lipitor *ANTIHYPERLIPIDEMICS*  NSAIDs  Reglan *GASTROINTESTINAL AGENTS - MISC.*  Ultram *ANALGESICS - OPIOID*   Medication History (April Staton, CMA; 10/28/2016 8:33 AM) Resveratrol (250MG Capsule, Oral) Active. Fluorouracil (0.5% Cream, External) Active. Protonix (40MG Tablet DR, Oral) Active. Depakote (500MG Tablet DR, Oral) Active. Biotin (10MG Tablet, Oral) Active. C-500 (500MG Tablet, Oral) Active. Bumetanide (1MG Tablet, Oral) Active. Cerefolin NAC (6-2-600MG Tablet, Oral) Active. Cod Liver Oil (Oral) Active. Cranberry Plus Vitamin C (4200-20-3MG-MG-UNIT Capsule, Oral) Active. Crestor (20MG Tablet, Oral) Active. D3-1000 (1000UNIT Capsule, Oral) Active. Diazepam (5MG Tablet, Oral) Active. (1/2 tab  in am and one at bedtime) Gabapentin (300MG Tablet, Oral) Active. Isosorbide Mononitrate ER (60MG Tablet ER 24HR, Oral) Active. Krill Oil  Omega-3 (300MG Capsule, Oral) Active. Losartan Potassium (50MG Tablet, bid Oral) Active. Plavix (75MG Tablet, Oral) Active. Premarin (0.3MG Tablet, Oral) Active. MetFORMIN HCl (500MG Tablet, Oral) Active. Metoprolol Succinate ER (25MG Tablet ER 24HR, Oral) Active. Mirapex (0.25MG Tablet, Oral) Active. Medications Reconciled  Vitals (April Staton CMA; 10/28/2016 8:33 AM) 10/28/2016 8:33 AM Weight: 165.5 lb Height: 66in Body Surface Area: 1.85 m Body Mass Index: 26.71 kg/m  Temp.: 97.12F(Oral)  Pulse: 70 (Regular)  BP: 138/84 (Sitting, Left Arm, Standard)   Physical Exam  General: alert and generally healthy appearing. Skin: Inspection and palpation of the skin unremarkable.  Eyes: Conjunctivae white, pupils equal. Face, ears, nose, mouth, and throat: Face - normal. Normal ears and nose. Lips and teeth normal.  Neck: Supple. No mass. Trachea midline. No thyroid mass. Lymph Nodes: No supraclavicular or cervical adenopathy. No axillary adenopathy. No inguinal adenopathy.  Lungs: Normal respiratory effort. Clear to auscultation and symmetric breath sounds. Cardiovascular: Regular rate and rythm. Normal auscultation of the heart. No murmur or rub.  Abdomen: Soft. No mass. Liver and spleen not palpable. No tenderness. No hernia. Normal bowel sounds. No abdominal scars. Rectal: Not done.  Musculoskeletal/extremities: Normal gait. Good strength and ROM in upper and lower extremities.  Neurologic: Grossly intact to motor and sensory function.  Psychiatric: Has normal mood and affect. Judgement and insight appear normal.  Assessment & Plan  1.  MALIGNANT NEOPLASM OF RIGHT BREAST, STAGE 1, ESTROGEN RECEPTOR POSITIVE (C50.911)  Story: M[facility] - 0.9 cm mass in the 11:30 o'clock location of the right breast, Breast Density - C, no axillary adenopathy  Biopsy: 10/20/2016 ([id number]) - IDC, grade 1, ER - 100%, PR 100%, Ki67 - 5%, Her2Neu -  neg  Plan    1) Cardiac clearance  Addendum Note(Briceson Broadwater H. Lucia Gaskins MD; 11/02/2016 12:29 PM)  Note from [name] - No further cardiac tests and he wrote that we can hold the Aspirin/Plavix for 5-7 days.   2) MRI of breast    MRI on 11/01/2016:     1. There is a 1.5 cm biopsy proven malignancy in the upper slightly outer quadrant of the right breast. No additional sites of disease are identified.     2. There is an indeterminate 6 mm enhancing mass in the upper-outer left breast.   3) Medical and radiation oncology - She saw [name]   4) Genetics    Addendum Note(Reola Buckles H. Lucia Gaskins MD; 12/09/2016 8:19 AM)    Her genetics were negative.  Plan:  Bilateral lumpectomy and bilateral  SLNBx  2.  ANTICOAGULATED (Z79.01)  Impression: On Plavix  To hold for 5 days preop.  3.  Left breast cancer  She had an MRI guided biopsy of the left breast on 11/11/2016 ([id number]) - this showed IDC, ER - 100%, PR - 10%, Ki67 - 2%, and Her2Neu - negative.  4. SCCa removed by Dr. Hulen Skains - 09/13/2015 5. Cardiology Followed by Dr. Roni Bread BMS PCI to the LAD and follow-up PTCA of in-stent restenosis back in 2011. Relatively negative Myoview in 2013.  6. HTN 7. Hypercholesterolemia 8. Left anterior tibial tendon tear - M. 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 Left total knee repair - 2005 - D. Murphy 10. Urethral sling for urinary incontinence - 2010  Tannenbaum 11. History of seizures  to strobe light  Sees Dr. Leonie Man 12. DM - off all meds  Followed by Dr. Chalmers Cater 13.Has an infected diabetic foot ulcer. She is seeding Dr. Sharol Given.       He thinks that she has gout  Alphonsa Overall, MD, [facility] Pager: [phone] Office phone:  [phone]

## 2016-12-13 NOTE — Anesthesia Preprocedure Evaluation (Addendum)
Anesthesia Evaluation  Patient identified by MRN, date of birth, ID band Patient awake    Reviewed: Allergy & Precautions, NPO status , Patient's Chart, lab work & pertinent test results  History of Anesthesia Complications Negative for: history of anesthetic complications  Airway Mallampati: II  TM Distance: >3 FB Neck ROM: Full    Dental no notable dental hx. (+) Upper Dentures, Lower Dentures, Dental Advidsory Given   Pulmonary neg pulmonary ROS,    Pulmonary exam normal        Cardiovascular hypertension, Pt. on medications and Pt. 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 TIA Neuromuscular disease    GI/Hepatic Neg liver ROS, GERD  Medicated and Controlled,  Endo/Other  diabetes, Poorly Controlled, Type 2, Oral Hypoglycemic AgentsDyslipidemia  Renal/GU Renal InsufficiencyRenal disease   Hx/o hematuria     Musculoskeletal  (+) Arthritis , Osteoarthritis,  Traumatic rupture of left anterior tibial tendon   Abdominal   Peds  Hematology negative hematology ROS (+)   Anesthesia Other Findings   Reproductive/Obstetrics                           Anesthesia Physical  Anesthesia Plan  ASA: III  Anesthesia Plan: General   Post-op Pain Management: GA combined w/ Regional for post-op pain   Induction: Intravenous  PONV Risk Score and Plan: 3 and Ondansetron, Dexamethasone and Diphenhydramine  Airway Management Planned: Oral ETT  Additional Equipment:   Intra-op Plan:   Post-operative Plan: Extubation in OR  Informed Consent: I have reviewed the patients History and Physical, chart, labs and discussed the procedure including the risks, benefits and alternatives for the proposed anesthesia with the patient or  authorized representative who has indicated his/her understanding and acceptance.   Dental advisory given and Dental Advisory Given  Plan Discussed with: Anesthesiologist, CRNA and Surgeon  Anesthesia Plan Comments:       Anesthesia Quick Evaluation

## 2016-12-14 ENCOUNTER — Ambulatory Visit (HOSPITAL_COMMUNITY): Payer: Medicare Other | Admitting: Anesthesiology

## 2016-12-14 ENCOUNTER — Encounter (HOSPITAL_COMMUNITY)
Admission: RE | Disposition: A | Discharge status: Home / Self Care | Payer: Self-pay | Source: Ambulatory Visit | Attending: Surgery

## 2016-12-14 ENCOUNTER — Encounter (HOSPITAL_COMMUNITY)
Admission: RE | Admit: 2016-12-14 | Discharge: 2016-12-14 | Disposition: A | Discharge status: Home / Self Care | Payer: Medicare Other | Source: Ambulatory Visit | Attending: Surgery | Admitting: Surgery

## 2016-12-14 ENCOUNTER — Ambulatory Visit
Admission: RE | Admit: 2016-12-14 | Discharge: 2016-12-14 | Disposition: A | Discharge status: Home / Self Care | Payer: Medicare Other | Source: Ambulatory Visit | Attending: Surgery | Admitting: Surgery

## 2016-12-14 ENCOUNTER — Encounter (HOSPITAL_COMMUNITY): Payer: Self-pay | Admitting: *Deleted

## 2016-12-14 ENCOUNTER — Ambulatory Visit (HOSPITAL_COMMUNITY)
Admission: RE | Admit: 2016-12-14 | Discharge: 2016-12-14 | Disposition: A | Discharge status: Home / Self Care | Payer: Medicare Other | Source: Ambulatory Visit | Attending: Surgery | Admitting: Surgery

## 2016-12-14 ENCOUNTER — Ambulatory Visit (HOSPITAL_COMMUNITY): Payer: Medicare Other | Admitting: Emergency Medicine

## 2016-12-14 DIAGNOSIS — Z79890 Hormone replacement therapy (postmenopausal): Secondary | ICD-10-CM | POA: Diagnosis not present

## 2016-12-14 DIAGNOSIS — E119 Type 2 diabetes mellitus without complications: Secondary | ICD-10-CM | POA: Diagnosis not present

## 2016-12-14 DIAGNOSIS — C50412 Malignant neoplasm of upper-outer quadrant of left female breast: Secondary | ICD-10-CM | POA: Diagnosis not present

## 2016-12-14 DIAGNOSIS — E7800 Pure hypercholesterolemia, unspecified: Secondary | ICD-10-CM | POA: Insufficient documentation

## 2016-12-14 DIAGNOSIS — I2510 Atherosclerotic heart disease of native coronary artery without angina pectoris: Secondary | ICD-10-CM | POA: Diagnosis not present

## 2016-12-14 DIAGNOSIS — E1165 Type 2 diabetes mellitus with hyperglycemia: Secondary | ICD-10-CM | POA: Diagnosis not present

## 2016-12-14 DIAGNOSIS — K219 Gastro-esophageal reflux disease without esophagitis: Secondary | ICD-10-CM | POA: Diagnosis not present

## 2016-12-14 DIAGNOSIS — Z7902 Long term (current) use of antithrombotics/antiplatelets: Secondary | ICD-10-CM | POA: Insufficient documentation

## 2016-12-14 DIAGNOSIS — Z955 Presence of coronary angioplasty implant and graft: Secondary | ICD-10-CM | POA: Insufficient documentation

## 2016-12-14 DIAGNOSIS — R569 Unspecified convulsions: Secondary | ICD-10-CM | POA: Insufficient documentation

## 2016-12-14 DIAGNOSIS — Z803 Family history of malignant neoplasm of breast: Secondary | ICD-10-CM | POA: Diagnosis not present

## 2016-12-14 DIAGNOSIS — E1142 Type 2 diabetes mellitus with diabetic polyneuropathy: Secondary | ICD-10-CM | POA: Insufficient documentation

## 2016-12-14 DIAGNOSIS — Z90710 Acquired absence of both cervix and uterus: Secondary | ICD-10-CM | POA: Insufficient documentation

## 2016-12-14 DIAGNOSIS — I10 Essential (primary) hypertension: Secondary | ICD-10-CM | POA: Diagnosis not present

## 2016-12-14 DIAGNOSIS — Z8041 Family history of malignant neoplasm of ovary: Secondary | ICD-10-CM | POA: Diagnosis not present

## 2016-12-14 DIAGNOSIS — C50912 Malignant neoplasm of unspecified site of left female breast: Secondary | ICD-10-CM | POA: Insufficient documentation

## 2016-12-14 DIAGNOSIS — L97909 Non-pressure chronic ulcer of unspecified part of unspecified lower leg with unspecified severity: Secondary | ICD-10-CM | POA: Diagnosis not present

## 2016-12-14 DIAGNOSIS — Z8673 Personal history of transient ischemic attack (TIA), and cerebral infarction without residual deficits: Secondary | ICD-10-CM | POA: Insufficient documentation

## 2016-12-14 DIAGNOSIS — R928 Other abnormal and inconclusive findings on diagnostic imaging of breast: Secondary | ICD-10-CM | POA: Diagnosis not present

## 2016-12-14 DIAGNOSIS — Z7984 Long term (current) use of oral hypoglycemic drugs: Secondary | ICD-10-CM | POA: Insufficient documentation

## 2016-12-14 DIAGNOSIS — C50411 Malignant neoplasm of upper-outer quadrant of right female breast: Secondary | ICD-10-CM | POA: Insufficient documentation

## 2016-12-14 DIAGNOSIS — C50911 Malignant neoplasm of unspecified site of right female breast: Secondary | ICD-10-CM | POA: Diagnosis present

## 2016-12-14 DIAGNOSIS — Z79899 Other long term (current) drug therapy: Secondary | ICD-10-CM | POA: Insufficient documentation

## 2016-12-14 DIAGNOSIS — Z170 Estrogen receptor positive status [ER+]: Principal | ICD-10-CM

## 2016-12-14 DIAGNOSIS — E11621 Type 2 diabetes mellitus with foot ulcer: Secondary | ICD-10-CM | POA: Insufficient documentation

## 2016-12-14 DIAGNOSIS — F0390 Unspecified dementia without behavioral disturbance: Secondary | ICD-10-CM | POA: Insufficient documentation

## 2016-12-14 DIAGNOSIS — C50211 Malignant neoplasm of upper-inner quadrant of right female breast: Secondary | ICD-10-CM | POA: Diagnosis not present

## 2016-12-14 DIAGNOSIS — G8918 Other acute postprocedural pain: Secondary | ICD-10-CM | POA: Diagnosis not present

## 2016-12-14 HISTORY — PX: BREAST LUMPECTOMY WITH RADIOACTIVE SEED AND SENTINEL LYMPH NODE BIOPSY: SHX6550

## 2016-12-14 LAB — GLUCOSE, CAPILLARY
Glucose-Capillary: 98 mg/dL (ref 65–99)
Glucose-Capillary: 127 mg/dL — ABNORMAL HIGH (ref 65–99)

## 2016-12-14 SURGERY — BREAST LUMPECTOMY WITH RADIOACTIVE SEED AND SENTINEL LYMPH NODE BIOPSY
Anesthesia: General | Site: Breast | Laterality: Bilateral

## 2016-12-14 MED ORDER — DEXAMETHASONE SODIUM PHOSPHATE 10 MG/ML IJ SOLN
INTRAMUSCULAR | Status: DC | PRN
  Administered 2016-12-14: 10 mg via INTRAVENOUS

## 2016-12-14 MED ORDER — DIPHENHYDRAMINE HCL 50 MG/ML IJ SOLN
INTRAMUSCULAR | Status: DC | PRN
  Administered 2016-12-14: 12.5 mg via INTRAVENOUS

## 2016-12-14 MED ORDER — ACETAMINOPHEN 500 MG PO TABS
1000.0000 mg | ORAL_TABLET | ORAL | Status: AC
  Administered 2016-12-14: 1000 mg via ORAL

## 2016-12-14 MED ORDER — MIDAZOLAM HCL 2 MG/2ML IJ SOLN
INTRAMUSCULAR | Status: AC
  Filled 2016-12-14: qty 2

## 2016-12-14 MED ORDER — MIDAZOLAM HCL 5 MG/5ML IJ SOLN
INTRAMUSCULAR | Status: DC | PRN
  Administered 2016-12-14: 2 mg via INTRAVENOUS

## 2016-12-14 MED ORDER — PHENYLEPHRINE HCL 10 MG/ML IJ SOLN
INTRAVENOUS | Status: DC | PRN
  Administered 2016-12-14: 50 ug/min via INTRAVENOUS

## 2016-12-14 MED ORDER — TECHNETIUM TC 99M SULFUR COLLOID FILTERED
1.0000 | Freq: Once | INTRAVENOUS | Status: AC | PRN
  Administered 2016-12-14: 1 via INTRADERMAL

## 2016-12-14 MED ORDER — BUPIVACAINE-EPINEPHRINE (PF) 0.25% -1:200000 IJ SOLN
INTRAMUSCULAR | Status: AC
  Filled 2016-12-14: qty 30

## 2016-12-14 MED ORDER — FENTANYL CITRATE (PF) 250 MCG/5ML IJ SOLN
INTRAMUSCULAR | Status: AC
  Filled 2016-12-14: qty 5

## 2016-12-14 MED ORDER — ONDANSETRON HCL 4 MG/2ML IJ SOLN
INTRAMUSCULAR | Status: DC | PRN
  Administered 2016-12-14: 4 mg via INTRAVENOUS

## 2016-12-14 MED ORDER — METHYLENE BLUE 0.5 % INJ SOLN
INTRAVENOUS | Status: AC
  Filled 2016-12-14: qty 10

## 2016-12-14 MED ORDER — PROPOFOL 10 MG/ML IV BOLUS
INTRAVENOUS | Status: DC | PRN
Start: 2016-12-14 — End: 2016-12-14
  Administered 2016-12-14: 150 mg via INTRAVENOUS

## 2016-12-14 MED ORDER — GABAPENTIN 300 MG PO CAPS
ORAL_CAPSULE | ORAL | Status: AC
  Administered 2016-12-14: 300 mg via ORAL
  Filled 2016-12-14: qty 1

## 2016-12-14 MED ORDER — CEFAZOLIN SODIUM-DEXTROSE 2-4 GM/100ML-% IV SOLN
2.0000 g | INTRAVENOUS | Status: AC
  Administered 2016-12-14: 2 g via INTRAVENOUS

## 2016-12-14 MED ORDER — PROMETHAZINE HCL 25 MG/ML IJ SOLN: 6.2500 mg | INTRAMUSCULAR | Status: DC | PRN

## 2016-12-14 MED ORDER — BUPIVACAINE-EPINEPHRINE 0.25% -1:200000 IJ SOLN
INTRAMUSCULAR | Status: DC | PRN
  Administered 2016-12-14: 30 mL

## 2016-12-14 MED ORDER — PROPOFOL 10 MG/ML IV BOLUS
INTRAVENOUS | Status: AC
  Filled 2016-12-14: qty 20

## 2016-12-14 MED ORDER — CEFAZOLIN SODIUM-DEXTROSE 2-4 GM/100ML-% IV SOLN
INTRAVENOUS | Status: AC
  Filled 2016-12-14: qty 100

## 2016-12-14 MED ORDER — SODIUM CHLORIDE 0.9 % IJ SOLN
INTRAVENOUS | Status: DC | PRN
  Administered 2016-12-14: 1 mL

## 2016-12-14 MED ORDER — GABAPENTIN 300 MG PO CAPS
300.0000 mg | ORAL_CAPSULE | ORAL | Status: AC
  Administered 2016-12-14: 300 mg via ORAL

## 2016-12-14 MED ORDER — HYDROCODONE-ACETAMINOPHEN 5-325 MG PO TABS: 1.0000 | ORAL_TABLET | Freq: Four times a day (QID) | ORAL | 0 refills | Status: DC | PRN

## 2016-12-14 MED ORDER — ROCURONIUM BROMIDE 100 MG/10ML IV SOLN
INTRAVENOUS | Status: DC | PRN
  Administered 2016-12-14: 60 mg via INTRAVENOUS

## 2016-12-14 MED ORDER — 0.9 % SODIUM CHLORIDE (POUR BTL) OPTIME
TOPICAL | Status: DC | PRN
  Administered 2016-12-14: 1000 mL

## 2016-12-14 MED ORDER — PHENYLEPHRINE HCL 10 MG/ML IJ SOLN
INTRAMUSCULAR | Status: DC | PRN
  Administered 2016-12-14 (×2): 80 ug via INTRAVENOUS

## 2016-12-14 MED ORDER — BUPIVACAINE-EPINEPHRINE (PF) 0.25% -1:200000 IJ SOLN
INTRAMUSCULAR | Status: DC | PRN
  Administered 2016-12-14 (×2): 150 mg

## 2016-12-14 MED ORDER — HYDROMORPHONE HCL 1 MG/ML IJ SOLN: 0.2500 mg | INTRAMUSCULAR | Status: DC | PRN

## 2016-12-14 MED ORDER — SUGAMMADEX SODIUM 200 MG/2ML IV SOLN
INTRAVENOUS | Status: DC | PRN
  Administered 2016-12-14: 160 mg via INTRAVENOUS

## 2016-12-14 MED ORDER — ACETAMINOPHEN 500 MG PO TABS
ORAL_TABLET | ORAL | Status: AC
  Administered 2016-12-14: 1000 mg via ORAL
  Filled 2016-12-14: qty 2

## 2016-12-14 MED ORDER — LACTATED RINGERS IV SOLN
INTRAVENOUS | Status: DC | PRN
  Administered 2016-12-14 (×2): via INTRAVENOUS

## 2016-12-14 MED ORDER — CHLORHEXIDINE GLUCONATE CLOTH 2 % EX PADS: 6.0000 | MEDICATED_PAD | Freq: Once | CUTANEOUS | Status: DC

## 2016-12-14 MED ORDER — FENTANYL CITRATE (PF) 100 MCG/2ML IJ SOLN
INTRAMUSCULAR | Status: DC | PRN
  Administered 2016-12-14 (×2): 50 ug via INTRAVENOUS
  Administered 2016-12-14: 150 ug via INTRAVENOUS

## 2016-12-14 SURGICAL SUPPLY — 49 items
ADH SKN CLS APL DERMABOND .7 (GAUZE/BANDAGES/DRESSINGS) ×4
BINDER BREAST LRG (GAUZE/BANDAGES/DRESSINGS) IMPLANT
BINDER BREAST XLRG (GAUZE/BANDAGES/DRESSINGS) ×4 IMPLANT
BLADE SURG 15 STRL LF DISP TIS (BLADE) ×1 IMPLANT
BLADE SURG 15 STRL SS (BLADE) ×6
CANISTER SUCT 3000ML PPV (MISCELLANEOUS) ×3 IMPLANT
CHLORAPREP W/TINT 26ML (MISCELLANEOUS) ×3 IMPLANT
CLIP TI MEDIUM LARGE 6 (CLIP) ×2 IMPLANT
CLIP VESOCCLUDE SM WIDE 6/CT (CLIP) ×5 IMPLANT
COVER PROBE W GEL 5X96 (DRAPES) ×3 IMPLANT
COVER SURGICAL LIGHT HANDLE (MISCELLANEOUS) ×3 IMPLANT
DERMABOND ADVANCED (GAUZE/BANDAGES/DRESSINGS) ×8
DERMABOND ADVANCED .7 DNX12 (GAUZE/BANDAGES/DRESSINGS) ×1 IMPLANT
DEVICE DUBIN SPECIMEN MAMMOGRA (MISCELLANEOUS) ×5 IMPLANT
DRAPE CHEST BREAST 15X10 FENES (DRAPES) ×3 IMPLANT
DRAPE UTILITY XL STRL (DRAPES) ×3 IMPLANT
ELECT COATED BLADE 2.86 ST (ELECTRODE) ×3 IMPLANT
ELECT REM PT RETURN 9FT ADLT (ELECTROSURGICAL) ×3
ELECTRODE REM PT RTRN 9FT ADLT (ELECTROSURGICAL) ×1 IMPLANT
GAUZE SPONGE 4X4 12PLY STRL (GAUZE/BANDAGES/DRESSINGS) ×3 IMPLANT
GAUZE SPONGE 4X4 12PLY STRL LF (GAUZE/BANDAGES/DRESSINGS) ×2 IMPLANT
GLOVE ECLIPSE 6.5 STRL STRAW (GLOVE) ×4 IMPLANT
GLOVE SURG SIGNA 7.5 PF LTX (GLOVE) ×8 IMPLANT
GOWN STRL REUS W/ TWL LRG LVL3 (GOWN DISPOSABLE) ×1 IMPLANT
GOWN STRL REUS W/ TWL XL LVL3 (GOWN DISPOSABLE) ×1 IMPLANT
GOWN STRL REUS W/TWL LRG LVL3 (GOWN DISPOSABLE) ×3
GOWN STRL REUS W/TWL XL LVL3 (GOWN DISPOSABLE) ×3
KIT BASIN OR (CUSTOM PROCEDURE TRAY) ×3 IMPLANT
KIT MARKER MARGIN INK (KITS) ×3 IMPLANT
NDL FILTER BLUNT 18X1 1/2 (NEEDLE) IMPLANT
NDL HYPO 25GX1X1/2 BEV (NEEDLE) ×1 IMPLANT
NDL SAFETY ECLIPSE 18X1.5 (NEEDLE) IMPLANT
NEEDLE FILTER BLUNT 18X 1/2SAF (NEEDLE) ×2
NEEDLE FILTER BLUNT 18X1 1/2 (NEEDLE) ×1 IMPLANT
NEEDLE HYPO 18GX1.5 SHARP (NEEDLE)
NEEDLE HYPO 25GX1X1/2 BEV (NEEDLE) ×6 IMPLANT
NS IRRIG 1000ML POUR BTL (IV SOLUTION) ×3 IMPLANT
PACK SURGICAL SETUP 50X90 (CUSTOM PROCEDURE TRAY) ×3 IMPLANT
PENCIL BUTTON HOLSTER BLD 10FT (ELECTRODE) ×3 IMPLANT
SPONGE LAP 18X18 X RAY DECT (DISPOSABLE) ×3 IMPLANT
SUT MNCRL AB 4-0 PS2 18 (SUTURE) ×7 IMPLANT
SUT VIC AB 3-0 SH 8-18 (SUTURE) ×5 IMPLANT
SYR BULB 3OZ (MISCELLANEOUS) ×3 IMPLANT
SYR CONTROL 10ML LL (SYRINGE) ×5 IMPLANT
TOWEL OR 17X24 6PK STRL BLUE (TOWEL DISPOSABLE) ×3 IMPLANT
TOWEL OR 17X26 10 PK STRL BLUE (TOWEL DISPOSABLE) ×3 IMPLANT
TUBE CONNECTING 12'X1/4 (SUCTIONS) ×1
TUBE CONNECTING 12X1/4 (SUCTIONS) ×2 IMPLANT
YANKAUER SUCT BULB TIP NO VENT (SUCTIONS) ×3 IMPLANT

## 2016-12-14 NOTE — Discharge Instructions (Signed)
[facility] - DISCHARGE INSTRUCTIONS TO PATIENT  Activity:  Driving - May drive in 2 or 3 days, if doing well   Lifting - No lifting more than 15 pounds for 7 days, then no limit  Wound Care:   Leave bandage and binder on for 2 days, then you may remove the binder and shower.  Diet:  As tolerated  Follow up appointment:  Call Dr. Pollie Friar office [facility]) at [phone] for an appointment in 2 to 3 weeks.  Medications and dosages:  Resume your home medications.  You have a prescription for:  Vicodin  Call Dr. Lucia Gaskins or his office  [phone]) if you have:  Temperature greater than 100.4,  Persistent nausea and vomiting,  Severe uncontrolled pain,  Redness, tenderness, or signs of infection (pain, swelling, redness, odor or green/yellow discharge around the site),  Difficulty breathing, headache or visual disturbances,  Any other questions or concerns you may have after discharge.  In an emergency, call 911 or go to an Emergency Department at a nearby hospital.

## 2016-12-14 NOTE — Anesthesia Procedure Notes (Signed)
Procedure Name: Intubation Date/Time: 12/14/2016 7:42 AM Performed by: Neldon Newport Pre-anesthesia Checklist: Timeout performed, Patient being monitored, Suction available, Emergency Drugs available and Patient identified Patient Re-evaluated:Patient Re-evaluated prior to induction Oxygen Delivery Method: Circle system utilized Preoxygenation: Pre-oxygenation with 100% oxygen Induction Type: IV induction Ventilation: Mask ventilation without difficulty and Oral airway inserted - appropriate to patient size Laryngoscope Size: Mac and 4 Grade View: Grade I Tube type: Oral Tube size: 7.0 mm Number of attempts: 1 Placement Confirmation: breath sounds checked- equal and bilateral,  positive ETCO2 and ETT inserted through vocal cords under direct vision Secured at: 21 cm Tube secured with: Tape Dental Injury: Teeth and Oropharynx as per pre-operative assessment

## 2016-12-14 NOTE — Anesthesia Postprocedure Evaluation (Signed)
Anesthesia Post Note  Patient: [name]  Procedure(s) Performed: Procedure(s) (LRB): BILATERAL BREAST LUMPECTOMY WITH BILATERAL  RADIOACTIVE SEED AND BILATERAL AXILLARY  SENTINEL LYMPH NODE BIOPSY ERAS PATHWAY (Bilateral)     Patient location during evaluation: PACU Anesthesia Type: General Level of consciousness: sedated Pain management: pain level controlled Vital Signs Assessment: post-procedure vital signs reviewed and stable Respiratory status: spontaneous breathing and respiratory function stable Cardiovascular status: stable Postop Assessment: no apparent nausea or vomiting Anesthetic complications: no    Last Vitals:  Vitals:   12/14/16 1031 12/14/16 1045  BP: (!) 169/83   Pulse: 77   Resp: (!) 21   Temp:  36.5 C  SpO2: 100%     Last Pain:  Vitals:   12/14/16 1111  TempSrc:   PainSc: 0-No pain                 Andre Gallego DANIEL

## 2016-12-14 NOTE — Op Note (Signed)
12/14/2016  9:38 AM  PATIENT:  [name] DOB: 1940/05/24 MRN: [id number]  PREOP DIAGNOSIS:   Bilateral breast cancers  POSTOP DIAGNOSIS:    Right breast cancer, 11:30 o'clock position (T1, N0), Left breast cancer, 1:00 o'clock position (T1,N0)  PROCEDURE:   Procedure(s): BILATERAL BREAST LUMPECTOMY WITH BILATERAL  RADIOACTIVE SEED AND BILATERAL AXILLARY  SENTINEL LYMPH NODE BIOPSY ERAS PATHWAY, Injection of peri areolar area of breast with methylene blue in right breast only (1.0 cc), bilateral deep sentinel lymph node biopsy [single incision on the right, two incisions on the left]  SURGEON:   Alphonsa Overall, M.D.  ANESTHESIA:   general  Anesthesiologist: Duane Boston, MD CRNA: Neldon Newport, CRNA; Theodoro Grist M, CRNA  General  EBL:  100  ml  DRAINS:  none   LOCAL MEDICATIONS USED:   Bilateral pectoral block by anesthesia  SPECIMEN:   Right breast lumpectomy (6 color paint), right axillary node biopsy (not blue and not hot), left breast lumpectomy (6 color paint), left axillary sentinel lymph node biopsy (counts 2,300, background 5)  COUNTS CORRECT:  YES  INDICATIONS FOR PROCEDURE:  [name] is a 76 y.o. (DOB: 07/19/1940) white female whose primary care physician is Redmond School, MD and comes for bilateral breast lumpectomy and bilateral axillary sentinel lymph node biopsy.   She has a 0.9 cm mass at the 11:30 o'clock position of the right breaast which on biopsy showed an IDC.  She has a 0.6 cm mass at the 1:00 o'clock position of the left breast (seen on MRI) which on biopsy showed IDC.  She has seen Drs. Burr Medico and [place] for oncology.   The options for breast cancer treatment have been discussed with the patient. She elected to proceed with lumpectomy and axillary sentinel lymph node.     The indications and potential complications of surgery were explained to the patient. Potential complications include, but are not limited to, bleeding, infection, the need for  further surgery, and nerve injury.     She had a three I131 seeds placed on 12/13/2016 in her breasts, one in the right breast and two in the left breast,  at [facility].  The seed is in the 11:30 o'clock position of the right breast.  There are 2 seeds is in the 1:00 o'clock position of the left breast.   In the holding area, both her areola was injected with 1 millicurie of Technitium Sulfur Colloid.  OPERATIVE NOTE:   The patient was taken to room # 8 at [facility] where she underwent a general anesthesia  supervised by Anesthesiologist: Duane Boston, MD CRNA: Neldon Newport, CRNA; Lavell Luster, CRNA. Both her breast and axilla were prepped with ChloraPrep and sterilely draped.    A time-out and the surgical check list was reviewed.    I injected about 0.5 mL of 40% methylene blue around her right areola (there was no hot area appreciated with the Neoprobe in the right axilla).   I did the first lumpectomy on the left side, because there were two seeds in the left breast, at the 1:00 o'clock position of the left breast.   I used the Neoprobe to identify the I131 seed.  I tried to excise an area around the tumor of at least 1 cm.    I excised this block of breast tissue approximately 3 cm by 4 cm  in diameter.  I took the dissection down to the chest wall.   I painted  the lumpectomy specimen with the 6 color paint kit and did a specimen mammogram which confirmed the mass, the clip, and both seed were all in the right position in the specimen.  The specimen was sent to pathology who called back to confirm that they have the seed and the specimen.   I did the lumpectomy on the right side with the seed at the 11:30 o'clock position of the right breast.   I used the Neoprobe to identify the I131 seed.  I tried to excise an area around the tumor of at least 1 cm.    I excised this block of breast tissue approximately 4 cm by 4 cm  in diameter.  I took the dissection down to the chest  wall.  I painted the lumpectomy specimen with the 6 color paint kit and did a specimen mammogram which confirmed the mass, the clip, and both seed were all in the right position in the specimen.  The specimen was sent to pathology who called back to confirm that they have the seed and the specimen.   I then started the right deep axillary sentinel lymph node biopsy. I went through the right breast lumpectomy wound to get to the axilla.   I could find neither a hot area in the axilla or any blue dye in the axilla.  I removed a block of the axilla that I think contained a lymph node just lateral to the pectoralis muscle.  I checked her internal mammary nodes and supraclavicular nodes with the neoprobe and found no other hot area. The right axillary node was then sent to pathology.    I then did the left deep axillary sentinel lymph node biopsy. I made a separate incision, in that the node was posterior.   The counts of the node were 2,300, with the back ground as 5.  I checked her internal mammary nodes and supraclavicular nodes with the neoprobe and found no other hot area. The left axillary node was then sent to pathology.    I then irrigated the wounds with saline. She had bilateral pectoral blocks by anesthesia, so I could add no more local medicine.  I placed 4 clips to mark biopsy cavity on both sides, at 12, 3, 6, and 9 o'clock.  I then closed all the wounds in layers using 3-0 Vicryl sutures for the deep layer. At the skin, I closed the incisions with a 4-0 Monocryl suture. The incisions were then painted with Dermabond.  She had gauze place over the wounds and placed in a breast binder.   So she has one incision on the right (in the breast), and 2 incisions on the left (one in the breast and one in the axilla).   The patient tolerated the procedure well, was transported to the recovery room in good condition. Sponge and needle count were correct at the end of the case.   Final pathology is  pending.   Alphonsa Overall, MD, [facility] Surgery Pager: [phone] Office phone:  [phone]

## 2016-12-14 NOTE — Anesthesia Procedure Notes (Signed)
Anesthesia Regional Block: Pectoralis block   Pre-Anesthetic Checklist: ,, timeout performed, Correct Patient, Correct Site, Correct Laterality, Correct Procedure, Correct Position, site marked, Risks and benefits discussed,  Surgical consent,  Pre-op evaluation,  At surgeon's request and post-op pain management  Laterality: Left  Prep: chloraprep       Needles:  Injection technique: Single-shot  Needle Type: Echogenic Stimulator Needle     Needle Length: 10cm  Needle Gauge: 21     Additional Needles:   Narrative:  Start time: 12/14/2016 7:04 AM End time: 12/14/2016 7:14 AM Injection made incrementally with aspirations every 5 mL.  Performed by: Personally

## 2016-12-14 NOTE — Anesthesia Procedure Notes (Signed)
Anesthesia Regional Block: Pectoralis block   Pre-Anesthetic Checklist: ,, timeout performed, Correct Patient, Correct Site, Correct Laterality, Correct Procedure, Correct Position, site marked, Risks and benefits discussed,  Surgical consent,  Pre-op evaluation,  At surgeon's request and post-op pain management  Laterality: Right  Prep: chloraprep       Needles:  Injection technique: Single-shot  Needle Type: Echogenic Stimulator Needle     Needle Length: 10cm  Needle Gauge: 21     Additional Needles:   Narrative:  Start time: 12/14/2016 6:51 AM End time: 12/14/2016 7:01 AM Injection made incrementally with aspirations every 5 mL.  Performed by: Personally

## 2016-12-14 NOTE — Transfer of Care (Signed)
Immediate Anesthesia Transfer of Care Note  Patient: [name]  Procedure(s) Performed: Procedure(s) with comments: BILATERAL BREAST LUMPECTOMY WITH BILATERAL  RADIOACTIVE SEED AND BILATERAL AXILLARY  SENTINEL LYMPH NODE BIOPSY ERAS PATHWAY (Bilateral) - PECTORAL BLOCK  Patient Location: PACU  Anesthesia Type:General  Level of Consciousness: sedated  Airway & Oxygen Therapy: Patient Spontanous Breathing and Patient connected to face mask oxygen  Post-op Assessment: Report given to RN, Post -op Vital signs reviewed and stable and Patient moving all extremities X 4  Post vital signs: Reviewed and stable  Last Vitals:  Vitals:   12/14/16 0549  BP: (!) 151/74  Pulse: 76  Resp: 18  Temp: 36.7 C  SpO2: 100%    Last Pain:  Vitals:   12/14/16 0603  TempSrc:   PainSc: 0-No pain      Patients Stated Pain Goal: 3 (53/29/92 4268)  Complications: No apparent anesthesia complications

## 2016-12-14 NOTE — Interval H&P Note (Signed)
History and Physical Interval Note:  12/14/2016 7:21 AM  [name]  has presented today for surgery, with the diagnosis of Bilateral breast cancers  The various methods of treatment have been discussed with the patient and family.   There are two seeds on the left.  Her husband and daughter are here with her.  After consideration of risks, benefits and other options for treatment, the patient has consented to  Procedure(s) with comments: BILATERAL BREAST LUMPECTOMY WITH BILATERAL  RADIOACTIVE SEED AND BILATERAL AXILLARY  SENTINEL [place] (Bilateral) - PECTORAL BLOCK as a surgical intervention .  The patient's history has been reviewed, patient examined, no change in status, stable for surgery.  I have reviewed the patient's chart and labs.  Questions were answered to the patient's satisfaction.     Cherysh Epperly H

## 2016-12-15 ENCOUNTER — Encounter (HOSPITAL_COMMUNITY): Payer: Self-pay | Admitting: Surgery

## 2016-12-16 ENCOUNTER — Telehealth: Payer: Self-pay | Admitting: *Deleted

## 2016-12-16 NOTE — Telephone Encounter (Signed)
Received order for oncotype testing. Requisition sent to pathology 

## 2016-12-21 DIAGNOSIS — I10 Essential (primary) hypertension: Secondary | ICD-10-CM | POA: Diagnosis not present

## 2016-12-21 DIAGNOSIS — N183 Chronic kidney disease, stage 3 (moderate): Secondary | ICD-10-CM | POA: Diagnosis not present

## 2016-12-21 DIAGNOSIS — D631 Anemia in chronic kidney disease: Secondary | ICD-10-CM | POA: Diagnosis not present

## 2016-12-21 DIAGNOSIS — N2581 Secondary hyperparathyroidism of renal origin: Secondary | ICD-10-CM | POA: Diagnosis not present

## 2016-12-23 DIAGNOSIS — E7800 Pure hypercholesterolemia, unspecified: Secondary | ICD-10-CM | POA: Diagnosis not present

## 2016-12-23 DIAGNOSIS — I10 Essential (primary) hypertension: Secondary | ICD-10-CM | POA: Diagnosis not present

## 2016-12-23 DIAGNOSIS — E1165 Type 2 diabetes mellitus with hyperglycemia: Secondary | ICD-10-CM | POA: Diagnosis not present

## 2016-12-23 DIAGNOSIS — Z23 Encounter for immunization: Secondary | ICD-10-CM | POA: Diagnosis not present

## 2016-12-23 DIAGNOSIS — G609 Hereditary and idiopathic neuropathy, unspecified: Secondary | ICD-10-CM | POA: Diagnosis not present

## 2016-12-27 ENCOUNTER — Telehealth: Payer: Self-pay | Admitting: *Deleted

## 2016-12-27 ENCOUNTER — Encounter: Payer: Self-pay | Admitting: Hematology

## 2016-12-27 DIAGNOSIS — Z170 Estrogen receptor positive status [ER+]: Secondary | ICD-10-CM

## 2016-12-27 DIAGNOSIS — L03032 Cellulitis of left toe: Secondary | ICD-10-CM | POA: Diagnosis not present

## 2016-12-27 DIAGNOSIS — C50211 Malignant neoplasm of upper-inner quadrant of right female breast: Secondary | ICD-10-CM

## 2016-12-27 DIAGNOSIS — M79675 Pain in left toe(s): Secondary | ICD-10-CM | POA: Diagnosis not present

## 2016-12-27 DIAGNOSIS — E1143 Type 2 diabetes mellitus with diabetic autonomic (poly)neuropathy: Secondary | ICD-10-CM | POA: Diagnosis not present

## 2016-12-27 NOTE — Telephone Encounter (Signed)
Received oncotype score of 16. Physician team notified. Left vm for pt to return call to discuss results and next steps. Contact information provided.

## 2016-12-29 NOTE — Progress Notes (Signed)
Diagnosis 12/14/2016 1. Breast, lumpectomy, Left w/seed - INVASIVE DUCTAL CARCINOMA, 1 CM. - ATYPICAL LOBULAR HYPERPLASIA ([place]). - PREVIOUS BIOPSY SITE. - INVASIVE CARCINOMA 0.2 CM FROM LATERAL MARGIN. - ATYPICA LOBULAR HYPERPLASIA LESS THAN 0.1 CM FROM LATERAL MARGIN. 2. Breast, lumpectomy, Right w/seed - INVASIVE DUCTAL CARCINOMA, 1.1 CM. - FIBROCYSTIC CHANGES. - PREVIOUS BIOPSY SITE. - INVASIVE CARCINOMA 1.1 CM FROM POSTERIOR MARGIN. 3. Lymph node, sentinel, biopsy, Right Axillary Contents - BENIGN FIBROADIPOSE TISSUE. - NO LYMPH NODE TISSUE OR MALIGNANCY. 4. Lymph node, sentinel, biopsy, Left Axillary - ONE BENIGN LYMPH NODE (0/1).  PREOP DIAGNOSIS:   Bilateral breast cancers   POSTOP DIAGNOSIS:    Right breast cancer, 11:30 o'clock position (T1, N0), Left breast cancer, 1:00 o'clock position (T1,N0)  PROCEDURE:   Procedure(s): BILATERAL BREAST LUMPECTOMY WITH BILATERAL  RADIOACTIVE SEED AND BILATERAL AXILLARY  SENTINEL LYMPH NODE BIOPSY ERAS PATHWAY, Injection of peri areolar area of breast with methylene blue in right breast only (1.0 cc), bilateral deep sentinel lymph node biopsy [single incision on the right, two incisions on the left]  SURGEON:   [name], M.D.  ANESTHESIA:   general             Anesthesiologist: Kristen Boston, MD CRNA: Kristen Newport, CRNA; Kristen Luster, CRNA             General  Follow up New consult last seen 11/09/16 Location of Breast Cancer:Right Breast Histology per Pathology Report  Receptor Status: ER(-100%), PR (100%), Her2-neu - neg), Ki-(-5%) 10/20/2016  Did patient present with symptoms (if so, please note symptoms) or was this found on screening mammography?: Found on Mammogram  Past/Anticipated interventions by surgeon, if any:Dr. Lucia [name] 12/13/2016 Plan: 1) Cardiac clearance, 2) MRI of breast, 3) Medical and radiation oncology, 4) Genetics  Bun 23  Creatine 151  12-07-2016  Past/Anticipated interventions by medical  oncology, if any: Dr. Burr [name] last appt 11/18/2016  Lymphedema issues, if any:   States that she has lymphedema from  shingles under her right arm.    Pain issues, if any:   Denies any pain in the breast area.  SAFETY ISSUES:  Prior radiation NO  Pacemaker/ICD Have 2 stainless steel stints  Possible current pregnancy? No   Is the patient on methotrexate?  No  Current Complaints / other details:  The patients sister and daughter both had breast cancer.  Patient has 2 stainless steel stints in her heart.    12/20/2016 Quantitative Single Gene Report ER 8.7 Positive, PR 5.4 Negative  Her2 9.6 Negative   Vitals:   12/30/16 1248  BP: (!) 132/92  Pulse: 77  Resp: 20  Temp: 98.2 F (36.8 C)  TempSrc: Oral  SpO2: 100%  Weight: 183 lb 4 oz (83.1 kg)   Wt Readings from Last 3 Encounters:  12/30/16 183 lb 4 oz (83.1 kg)  12/14/16 175 lb (79.4 kg)  12/07/16 175 lb 5 oz (79.5 kg)    Kristen Sayer, LPN     32/03/2246,25:00 AM

## 2016-12-30 ENCOUNTER — Encounter: Payer: Self-pay | Admitting: Radiation Oncology

## 2016-12-30 ENCOUNTER — Ambulatory Visit
Admission: RE | Admit: 2016-12-30 | Discharge: 2016-12-30 | Disposition: A | Discharge status: Home / Self Care | Payer: Medicare Other | Source: Ambulatory Visit | Attending: Radiation Oncology | Admitting: Radiation Oncology

## 2016-12-30 VITALS — BP 132/92 | HR 77 | Temp 98.2°F | Resp 20 | Wt 183.2 lb

## 2016-12-30 DIAGNOSIS — C50211 Malignant neoplasm of upper-inner quadrant of right female breast: Secondary | ICD-10-CM

## 2016-12-30 DIAGNOSIS — Z170 Estrogen receptor positive status [ER+]: Secondary | ICD-10-CM | POA: Diagnosis not present

## 2016-12-30 DIAGNOSIS — Z510 Encounter for antineoplastic radiation therapy: Secondary | ICD-10-CM | POA: Diagnosis not present

## 2016-12-30 DIAGNOSIS — C50412 Malignant neoplasm of upper-outer quadrant of left female breast: Secondary | ICD-10-CM

## 2016-12-30 DIAGNOSIS — Z9889 Other specified postprocedural states: Secondary | ICD-10-CM | POA: Diagnosis not present

## 2016-12-30 DIAGNOSIS — Z803 Family history of malignant neoplasm of breast: Secondary | ICD-10-CM | POA: Diagnosis not present

## 2016-12-30 NOTE — Progress Notes (Signed)
Radiation Oncology         [phone] ________________________________  Name: [name]        MRN: [id number]  Date of Service: 12/30/2016 DOB: 02-21-1941  CC:Redmond School, MD  Alphonsa Overall, MD     REFERRING PHYSICIAN: Alphonsa Overall, MD   DIAGNOSIS: The encounter diagnosis was Malignant 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, estrogen receptor positive ([place]).   HISTORY OF PRESENT ILLNESS: [name] is a 76 y.o. female originally seen at the request of Dr. Lucia Gaskins for a new diagnosis of right breast cancer. The patient was seen for screening mammography on 10/12/16 which revealed a possible distortion of the right breast. Diagnostic mammogram and ultrasound on 10/20/16 were performed. At 11:30, a lesion was noted measuring 9 x 9 x 8 mm with internal vascularity, and the right axilla was negative for adenopathy. A biopsy that day revealed a grade 1, ER/PR positive HER2 negative, Ki67 5% invasive ductal carcinoma. She also underwent MRI of bilateral breasts on  11/01/16 revealing a 15 x 8 x 11 mm lesion in the site of her known malignancy, as well as a 6 mm lesion in the upper outer quadrant of the left breast. She did have a biopsy of the left breast on 11/11/16 confirming invasive mammary carcinoma with atypical lobular hyperplasia, ER/PR positive, HER 2 negative Ki 67 was 2%. She underwent bilateral lumpectomies on 12/14/16 which revealed a grade 1 invasive ductal carcinoma of each breast, on the left, 1.1 cm, and 1 cm on the right. The left sentinel node was negative and there was no nodal tissue on the right axillary specimen but clinically this had been negative. Her margins were negative as well. Her oncotype score was 16 and no need for chemo is anticipated. She comes today to discuss options of radiotherapy. She has also met with genetic counseling and has a variant of undetermined clinical significance in the BARD gene.   PREVIOUS RADIATION THERAPY: No   PAST  MEDICAL HISTORY:  Past Medical History:  Diagnosis Date  . Anxiety   . Arthritis     osteoarthritis  . Breast cancer in female [facility])    Bilateral  . CAD S/P percutaneous coronary angioplasty 2007; March 2011   [facility] [facility] 3.0 mm 20 mm postdilated 3.6 mm in early mid LAD; status post ISR Cutting Balloon PTCA and March 11 along with PCI of distal mid lesion with a 3.0 mm 12 mm MultiLink vision BMS; the proximal stent causes jailing of SP1 and SP2 with ostial 70-80% lesions  . Cancer (HCC)    Melanoma, Squamous cell Carcinoma  . CHF (congestive heart failure) ([place])   . Chronic kidney disease    stage 2   . Dementia    mild  . Diabetes mellitus type 2 with neurological manifestations ([place])   . Dyslipidemia, goal LDL below 70   . Full dentures   . Genetic testing 12/03/2016   Germline genetic testing was performed through [facility] Common Hereditary Cancers Panel + [facility] Melanoma Panel. This custom panel includes analysis of the following 51 genes: APC, ATM, AXIN2, BAP1, BARD1, BMPR1A, BRCA1, BRCA2, BRIP1, CDH1, CDK4, CDKN2A, CHEK2, CTNNA1, DICER1, EPCAM, GREM1, HOXB13, KIT, MEN1, MITF, MLH1, MSH2, MSH3, MSH6, MUTYH, NBN, NF1, NTHL1, PALB2, PDGFRA, PMS2, POLD1, POL  . GERD (gastroesophageal reflux disease)   . Headache(784.0)   . History of hematuria    Followed by Dr. Gaynelle Arabian  . Hypertension, essential, benign   . Nausea &  vomiting 10/2015  . Peripheral neuropathy   . Stroke ([place])   . TIA (transient ischemic attack)    multiple in the past  . Wears glasses        PAST SURGICAL HISTORY: Past Surgical History:  Procedure Laterality Date  . ABDOMINAL HYSTERECTOMY    . APPENDECTOMY    . BLADDER SUSPENSION    . BREAST LUMPECTOMY WITH RADIOACTIVE SEED AND SENTINEL LYMPH NODE BIOPSY Bilateral 12/14/2016   Procedure: BILATERAL BREAST LUMPECTOMY WITH BILATERAL  RADIOACTIVE SEED AND BILATERAL AXILLARY  SENTINEL LYMPH NODE BIOPSY ERAS PATHWAY;  Surgeon: Alphonsa Overall, MD;  Location:  [facility];  Service: General;  Laterality: Bilateral;  PECTORAL BLOCK  . CARDIAC CATHETERIZATION  02/24/2006   85% stenosis in the proximal portion of LAD-arrangements made for PCI on 02/25/2006  . CARDIAC CATHETERIZATION  02/25/2006   80% LAD lesion stented with a 3x71m Liberte stent resulting in reduction of 80% lesion to 0% residual  . CARDIAC CATHETERIZATION  04/26/2006   Medical management  . CARDIAC CATHETERIZATION  06/24/2009   60-70% re-stenosis in the proximal LAD. A 3.25x15 cutting balloon, 3 inflations - 14atm-38sec, 13atm-39sec, and 12atm-40sec reduced to less than 10%. 60% stenosis of the mid/distal LAD stented with a 3x192mMultilink stent.  . Marland KitchenARDIAC CATHETERIZATION  07/09/2009   Medical management  . CAROTID DOPPLER  06/24/2009   40-59% R ICA stenosis. No significant ICA stenosis noted  . CHOLECYSTECTOMY    . DILATION AND CURETTAGE OF UTERUS    . JOINT REPLACEMENT Left   . LESION REMOVAL Right 03/11/2015   Procedure:  EXCISION OF RIGHT PRE TIBIAL LESION;  Surgeon: JaJudeth HornMD;  [facility] Service: General;  Laterality: Right;  . MASS EXCISION Left 06/10/2014   Procedure: EXCISION LEFT LOWER LEG LESION;  Surgeon: JaDoreen SalvageMD;  [facility] Service: General;  Laterality: Left;  . Marland KitchenASS EXCISION Left 09/05/2015   Procedure: EXCISION OF LEFT FOREARM  MASS;  Surgeon: JaJudeth HornMD;  [facility] Service: General;  Laterality: Left;  . NM MYOVIEW LTD  11/16/2011   5 beats a PVC during recovery.  No skin or infarction.  Reached 5 METs, EKG negative for ischemia   . SHOULDER ARTHROSCOPY  10/15  . SHOULDER ARTHROSCOPY  1995   left  . TENDON REPAIR Left 05/12/2016   Procedure: Left Anterior Tibial Tendon Reconstruction;  Surgeon: MaNewt MinionMD;  Location: [facility] Service: Orthopedics;  Laterality: Left;  . TOTAL KNEE ARTHROPLASTY  2005   left  . TRANSTHORACIC ECHOCARDIOGRAM  10/2015   EF 55-60 %. Mild  LVH. Mild pulmonary hypertension. Normal valves.  . TRANSTHORACIC ECHOCARDIOGRAM  12/31/2009/July 2015   IsLV size & function, Gr 1 DD w/ mild TR.;; b)09/2013: 55-60%. Grade 1 DD, mild aortic sclerosis  . WRIST ARTHROPLASTY  2010   cancer lt wrist     FAMILY HISTORY:  Family History  Problem Relation Age of Onset  . Diabetes Mother   . Hypertension Mother   . Kidney disease Mother   . Hypertension Father   . Emphysema Father   . Heart attack Father   . Heart disease Father   . Arthritis Sister   . Diabetes Sister   . Hypertension Sister   . Breast cancer Sister 3021     treated with mastectomy  . Cervical cancer Sister 2033. Hypertension Brother   . Hyperlipidemia Brother   .  Diabetes Brother   . Stroke Brother   . Diabetes Sister   . Hypertension Sister   . Stomach cancer Sister 49  . Hypertension Brother   . ALS Brother        d.62s  . Breast cancer Daughter 63       triple negative treated with lumpectomy, chemo, radiation  . Heart attack Daughter 8       d.45  . COPD Daughter   . Cancer Paternal Aunt        unspecified type  . Cancer Paternal Uncle        unspecified type  . Cancer Paternal Uncle        unspecified type     SOCIAL HISTORY:  reports that she has never smoked. She has never used smokeless tobacco. She reports that she does not drink alcohol or use drugs.  The patient lives in [place]. She is married. Her husband is a retired Education officer, environmental.   ALLERGIES: Lipitor [atorvastatin]; Nsaids; Reglan [metoclopramide]; and Ultram [tramadol]   MEDICATIONS:  Current Outpatient Prescriptions  Medication Sig Dispense Refill  . allopurinol (ZYLOPRIM) 100 MG tablet Take 1 tablet (100 mg total) by mouth daily. 30 tablet 6  . Biotin w/ Vitamins C & E (HAIR SKIN & NAILS GUMMIES PO) Take 2 tablets by mouth daily.     . bumetanide (BUMEX) 1 MG tablet Take 2 mg by mouth daily.     . busPIRone (BUSPAR) 7.5 MG tablet Take 7.5 mg by mouth 2 (two) times daily.    .  Calcium Carb-Cholecalciferol (CALCIUM 600 + D PO) Take 1 tablet by mouth at bedtime.     . Cholecalciferol 2000 units CAPS Take 2,000 Units by mouth daily.    . clopidogrel (PLAVIX) 75 MG tablet Take 1 tablet (75 mg total) by mouth daily. 90 tablet 3  . COD LIVER OIL PO Take 415 mg by mouth daily.    . colchicine 0.6 MG tablet Take one tablet daily during acute gout attack for 3 days, discontinue after this. Resume in the event of another acute gout flare up. 30 tablet 5  . CRANBERRY PO Take 1 capsule by mouth daily.    . diphenhydrAMINE (BENADRYL) 50 MG capsule Take 50 mg by mouth at bedtime.    . Fish Oil-Krill Oil (KRILL OIL PLUS PO) Take 1 capsule by mouth daily.     Marland Kitchen gabapentin (NEURONTIN) 300 MG capsule Take 600 mg by mouth at bedtime.     Marland Kitchen HYDROcodone-acetaminophen (NORCO/VICODIN) 5-325 MG tablet Take 1-2 tablets by mouth every 6 (six) hours as needed for moderate pain. 20 tablet 0  . Iron-FA-B Cmp-C-Biot-Probiotic (FUSION PLUS) CAPS takes 1 tablet daily  9  . LORazepam (ATIVAN) 0.5 MG tablet Take 0.5 mg by mouth 4 (four) times daily.    Marland Kitchen losartan (COZAAR) 100 MG tablet take 1 tablet daily  3  . Melatonin 5 MG CAPS Take 5 mg by mouth at bedtime.    . Methylfol-Methylcob-Acetylcyst (CEREFOLIN NAC) 6-2-600 MG TABS TAKE 1 CAPLET BY MOUTH ONCE DAILY. 90 each 1  . metoprolol succinate (TOPROL XL) 50 MG 24 hr tablet Take 0.5 tablet (25 mg total) by mouth daily. 45 tablet 3  . nitroGLYCERIN (NITROLINGUAL) 0.4 MG/SPRAY spray Place 1 spray under the tongue every 5 (five) minutes x 3 doses as needed for chest pain.     . pantoprazole (PROTONIX) 40 MG tablet TAKE 1 TABLET DAILY (CONTACT OFFICE FOR ADDITIONAL REFILLS, FINAL ATTEMPT) 90  tablet 0  . pramipexole (MIRAPEX) 0.25 MG tablet Take .25 mg at 4pm and 2 tabs (0.'5mg'$ )at bedtime 270 tablet 1  . pregabalin (LYRICA) 75 MG capsule Take 75 mg by mouth 2 (two) times daily.    Marland Kitchen Resveratrol (RESERVAPAK PO) Take 1 tablet by mouth every evening.     .  rosuvastatin (CRESTOR) 20 MG tablet Take 20 mg by mouth at bedtime.    . topiramate (TOPAMAX) 25 MG tablet Take 1 tablet (25 mg total) by mouth 2 (two) times daily. 180 tablet 1   No current facility-administered medications for this encounter.      REVIEW OF SYSTEMS: On review of systems, the patient reports that she is doing well overall. She's recovering well since her surgery. She denies any chest pain, shortness of breath, cough, fevers, chills, night sweats, unintended weight changes. She denies any bowel or bladder disturbances, and denies abdominal pain, nausea or vomiting. She denies any new musculoskeletal or joint aches or pains. A complete review of systems is obtained and is otherwise negative.     PHYSICAL EXAM:  Wt Readings from Last 3 Encounters:  12/30/16 183 lb 4 oz (83.1 kg)  12/14/16 175 lb (79.4 kg)  12/07/16 175 lb 5 oz (79.5 kg)   Temp Readings from Last 3 Encounters:  12/30/16 98.2 F (36.8 C) (Oral)  12/14/16 97.7 F (36.5 C)  12/07/16 98.3 F (36.8 C) (Oral)   BP Readings from Last 3 Encounters:  12/30/16 (!) 132/92  12/14/16 (!) 169/83  12/07/16 (!) 153/94   Pulse Readings from Last 3 Encounters:  12/30/16 77  12/14/16 77  12/07/16 82   Pain Assessment Pain Score: 0-No pain/10  In general this is a well appearing caucasian female in no acute distress. She's alert and oriented x4 and appropriate throughout the examination. Cardiopulmonary assessment is negative for acute distress and she exhibits normal effort. Bilateral lumpectomy sites are intact without erythema or cellulitic changes. The left axillary incision site is well healed as well. No edema of either upper extremity is noted.   ECOG =0  0 - Asymptomatic (Fully active, able to carry on all predisease activities without restriction)  1 - Symptomatic but completely ambulatory (Restricted in physically strenuous activity but ambulatory and able to carry out work of a light or sedentary  nature. For example, light housework, office work)  2 - Symptomatic, <50% in bed during the day (Ambulatory and capable of all self care but unable to carry out any work activities. Up and about more than 50% of waking hours)  3 - Symptomatic, >50% in bed, but not bedbound (Capable of only limited self-care, confined to bed or chair 50% or more of waking hours)  4 - Bedbound (Completely disabled. Cannot carry on any self-care. Totally confined to bed or chair)  5 - Death   Eustace Pen MM, Creech RH, Tormey DC, et al. [phone]). "Toxicity and response criteria of the [facility]". [place] Oncol. 5 (6): 649-55    LABORATORY DATA:  Lab Results  Component Value Date   WBC 6.4 12/07/2016   HGB 12.6 12/07/2016   HCT 38.5 12/07/2016   MCV 91.9 12/07/2016   PLT 207 12/07/2016   Lab Results  Component Value Date   NA 139 12/07/2016   K 4.9 12/07/2016   CL 105 12/07/2016   CO2 25 12/07/2016   Lab Results  Component Value Date   ALT 19 11/13/2016   AST 25 11/13/2016   ALKPHOS  68 11/13/2016   BILITOT 0.3 11/13/2016      RADIOGRAPHY: Mm Breast Surgical Specimen  Result Date: 12/14/2016 CLINICAL DATA:  Patient status post right lumpectomy. EXAM: SPECIMEN RADIOGRAPH OF THE RIGHT BREAST COMPARISON:  Previous exam(s). FINDINGS: Status post excision of the right breast. The radioactive seed and biopsy marker clip are present, completely intact, and were marked for pathology. IMPRESSION: Specimen radiograph of the right breast. Electronically Signed   By: Lovey Newcomer M.D.   On: 12/14/2016 08:48   Mm Breast Surgical Specimen  Result Date: 12/14/2016 CLINICAL DATA:  Specimen radiograph status post left breast lumpectomy. EXAM: SPECIMEN RADIOGRAPH OF THE LEFT BREAST COMPARISON:  Previous exam(s). FINDINGS: Status post excision of the left breast. The 2 radioactive seeds and biopsy marker clip are present, completely intact, and were marked for pathology. These findings were  communicated with the OR at 8:24 a.m. IMPRESSION: Specimen radiograph of the left breast. Electronically Signed   By: Ammie Ferrier M.D.   On: 12/14/2016 08:25   Mm Lt Radioactive Seed Loc Mammo Guide  Result Date: 12/13/2016 CLINICAL DATA:  Patient for preoperative localization prior to left breast lumpectomy. EXAM: MAMMOGRAPHIC GUIDED RADIOACTIVE SEED LOCALIZATION OF THE LEFT BREAST COMPARISON:  Previous exam(s). FINDINGS: Patient presents for radioactive seed localization prior to left lumpectomy. I met with the patient and we discussed the procedure of seed localization including benefits and alternatives. We discussed the high likelihood of a successful procedure. We discussed the risks of the procedure including infection, bleeding, tissue injury and further surgery. We discussed the low dose of radioactivity involved in the procedure. Informed, written consent was given. The usual time-out protocol was performed immediately prior to the procedure. Seed 1: Using mammographic guidance, sterile technique, 1% lidocaine and an I-125 radioactive seed, dumbbell-shaped marking clip was localized using a cranial approach. The follow-up mammogram images confirm the seed to be located 1.7 cm cranial to the biopsy marking clip on the true lateral view. Note that the initially placed radioactive seed migrated approximately 1.7 cm cranial to the biopsy marking clip on the true lateral view. Given the location of the initially placed seed , it was determined, in discussion with Dr. Lucia Gaskins, that a second seed should be placed at the biopsy marking clip. Seed 2: Using mammographic guidance, sterile technique, 1% lidocaine and an I-125 radioactive seed, dumbbell-shaped marking clip was localized using a cranial approach. The follow-up mammogram images confirm the seed in the expected location and were marked for Dr. Lucia Gaskins. Follow-up survey of the patient confirms presence of the radioactive seeds. Seed 1: Order number  of I-125 seed:  [id number]. Total activity:  6.270 millicuries  Reference Date: 12/02/2016 Seed 2: Order number of I-125 seed:  [id number]. Total activity:  2.993 millicuries  Reference Date: 12/02/2016 The patient tolerated the procedure well and was released from the [facility]. She was given instructions regarding seed removal. IMPRESSION: Radioactive seed localization left breast. Note there are two seeds within the left breast as the first seed migrated cranially from the biopsy marking clip. The second seed is located at the appropriate position at the biopsy marking clip. This was discussed with Dr. Lucia Gaskins at the time of the procedure 1:30 p.m. on 12/13/2016. Electronically Signed   By: Lovey Newcomer M.D.   On: 12/13/2016 14:06   Mm Rt Radioactive Seed Loc Mammo Guide  Result Date: 12/13/2016 CLINICAL DATA:  Patient for preoperative localization prior to right lumpectomy. EXAM: MAMMOGRAPHIC GUIDED RADIOACTIVE SEED LOCALIZATION OF THE RIGHT BREAST  COMPARISON:  Previous exam(s). FINDINGS: Patient presents for radioactive seed localization prior to right lumpectomy. I met with the patient and we discussed the procedure of seed localization including benefits and alternatives. We discussed the high likelihood of a successful procedure. We discussed the risks of the procedure including infection, bleeding, tissue injury and further surgery. We discussed the low dose of radioactivity involved in the procedure. Informed, written consent was given. The usual time-out protocol was performed immediately prior to the procedure. Using mammographic guidance, sterile technique, 1% lidocaine and an I-125 radioactive seed, mass and ribbon shaped marking clip were localized using a cranial approach. The follow-up mammogram images confirm the seed in the expected location and were marked for Dr. Lucia Gaskins. Follow-up survey of the patient confirms presence of the radioactive seed. Order number of I-125 seed:  [id number]. Total  activity:  7.341 millicuries  Reference Date: 12/02/2016 The patient tolerated the procedure well and was released from the [facility]. She was given instructions regarding seed removal. IMPRESSION: Radioactive seed localization right breast. No apparent complications. Electronically Signed   By: Lovey Newcomer M.D.   On: 12/13/2016 14:07       IMPRESSION/PLAN: 1. Stage IA, pT1cN0M0, ER/PR positive, grade 1, invasive ductal carcinoma of the right breast, and Stage IA, pT1bN0M0 grade 1 invasive ductal carcinoma of the left breast. Her oncotype score does not indicate a role for chemotherapy and the patient's course would now be followed by external radiotherapy to bilateral breasts followed by antiestrogen therapy. We discussed the risks, benefits, short, and long term effects of radiotherapy, and the patient is interested in proceeding. Dr. Lisbeth Renshaw discusses the delivery and logistics of radiotherapy. He would offer a course of 4 weeks with high right tangents to cover the lack of nodal sampling, and to the left with deep inspiration breath hold to avoid exposure to the heart. Written consent is obtained and placed in the chart, a copy was provided to the patient. She will return next Friday for simulation.   In a visit lasting 25 minutes, greater than 50% of the time was spent face to face discussing her pathology and logistics of her treatment, and coordinating the patient's care.  The above documentation reflects my direct findings during this shared patient visit. Please see the separate note by Dr. Lisbeth Renshaw on this date for the remainder of the patient's plan of care.    Carola Rhine, PAC

## 2016-12-31 ENCOUNTER — Encounter (HOSPITAL_COMMUNITY): Payer: Self-pay

## 2017-01-07 ENCOUNTER — Ambulatory Visit
Admission: RE | Admit: 2017-01-07 | Discharge: 2017-01-07 | Disposition: A | Discharge status: Home / Self Care | Payer: Medicare Other | Source: Ambulatory Visit | Attending: Radiation Oncology | Admitting: Radiation Oncology

## 2017-01-07 DIAGNOSIS — Z803 Family history of malignant neoplasm of breast: Secondary | ICD-10-CM | POA: Diagnosis not present

## 2017-01-07 DIAGNOSIS — Z170 Estrogen receptor positive status [ER+]: Secondary | ICD-10-CM | POA: Diagnosis not present

## 2017-01-07 DIAGNOSIS — C50412 Malignant neoplasm of upper-outer quadrant of left female breast: Secondary | ICD-10-CM

## 2017-01-07 DIAGNOSIS — C50211 Malignant neoplasm of upper-inner quadrant of right female breast: Secondary | ICD-10-CM

## 2017-01-07 DIAGNOSIS — Z510 Encounter for antineoplastic radiation therapy: Secondary | ICD-10-CM | POA: Diagnosis not present

## 2017-01-11 ENCOUNTER — Telehealth: Payer: Self-pay | Admitting: Hematology

## 2017-01-11 DIAGNOSIS — L03032 Cellulitis of left toe: Secondary | ICD-10-CM | POA: Diagnosis not present

## 2017-01-11 DIAGNOSIS — S90222A Contusion of left lesser toe(s) with damage to nail, initial encounter: Secondary | ICD-10-CM | POA: Diagnosis not present

## 2017-01-11 DIAGNOSIS — E1143 Type 2 diabetes mellitus with diabetic autonomic (poly)neuropathy: Secondary | ICD-10-CM | POA: Diagnosis not present

## 2017-01-11 NOTE — Telephone Encounter (Signed)
Patient is scheduled per 10/12 scheduled message.

## 2017-01-14 ENCOUNTER — Ambulatory Visit: Payer: Medicare Other | Admitting: Radiation Oncology

## 2017-01-14 ENCOUNTER — Ambulatory Visit: Payer: Medicare Other

## 2017-01-14 DIAGNOSIS — C50412 Malignant neoplasm of upper-outer quadrant of left female breast: Secondary | ICD-10-CM | POA: Diagnosis not present

## 2017-01-14 DIAGNOSIS — Z170 Estrogen receptor positive status [ER+]: Secondary | ICD-10-CM | POA: Diagnosis not present

## 2017-01-14 DIAGNOSIS — C50211 Malignant neoplasm of upper-inner quadrant of right female breast: Secondary | ICD-10-CM | POA: Diagnosis not present

## 2017-01-14 DIAGNOSIS — Z510 Encounter for antineoplastic radiation therapy: Secondary | ICD-10-CM | POA: Diagnosis not present

## 2017-01-14 DIAGNOSIS — Z803 Family history of malignant neoplasm of breast: Secondary | ICD-10-CM | POA: Diagnosis not present

## 2017-01-16 ENCOUNTER — Other Ambulatory Visit: Payer: Self-pay | Admitting: Nurse Practitioner

## 2017-01-17 ENCOUNTER — Ambulatory Visit
Admission: RE | Admit: 2017-01-17 | Discharge: 2017-01-17 | Disposition: A | Discharge status: Home / Self Care | Payer: Medicare Other | Source: Ambulatory Visit | Attending: Radiation Oncology | Admitting: Radiation Oncology

## 2017-01-17 DIAGNOSIS — Z170 Estrogen receptor positive status [ER+]: Principal | ICD-10-CM

## 2017-01-17 DIAGNOSIS — C50412 Malignant neoplasm of upper-outer quadrant of left female breast: Secondary | ICD-10-CM

## 2017-01-17 DIAGNOSIS — Z803 Family history of malignant neoplasm of breast: Secondary | ICD-10-CM | POA: Diagnosis not present

## 2017-01-17 DIAGNOSIS — C50211 Malignant neoplasm of upper-inner quadrant of right female breast: Secondary | ICD-10-CM | POA: Diagnosis not present

## 2017-01-17 DIAGNOSIS — Z510 Encounter for antineoplastic radiation therapy: Secondary | ICD-10-CM | POA: Diagnosis not present

## 2017-01-17 MED ORDER — RADIAPLEXRX EX GEL
Freq: Once | CUTANEOUS | Status: AC
  Administered 2017-01-17: 12:00:00 via TOPICAL

## 2017-01-17 MED ORDER — ALRA NON-METALLIC DEODORANT (RAD-ONC)
1.0000 "application " | Freq: Once | TOPICAL | Status: AC
  Administered 2017-01-17: 1 via TOPICAL

## 2017-01-18 ENCOUNTER — Ambulatory Visit
Admission: RE | Admit: 2017-01-18 | Discharge: 2017-01-18 | Disposition: A | Discharge status: Home / Self Care | Payer: Medicare Other | Source: Ambulatory Visit | Attending: Radiation Oncology | Admitting: Radiation Oncology

## 2017-01-18 DIAGNOSIS — L03032 Cellulitis of left toe: Secondary | ICD-10-CM | POA: Diagnosis not present

## 2017-01-18 DIAGNOSIS — Z510 Encounter for antineoplastic radiation therapy: Secondary | ICD-10-CM | POA: Diagnosis not present

## 2017-01-18 DIAGNOSIS — Z803 Family history of malignant neoplasm of breast: Secondary | ICD-10-CM | POA: Diagnosis not present

## 2017-01-18 DIAGNOSIS — C50412 Malignant neoplasm of upper-outer quadrant of left female breast: Secondary | ICD-10-CM | POA: Diagnosis not present

## 2017-01-18 DIAGNOSIS — Z170 Estrogen receptor positive status [ER+]: Secondary | ICD-10-CM | POA: Diagnosis not present

## 2017-01-18 DIAGNOSIS — C50211 Malignant neoplasm of upper-inner quadrant of right female breast: Secondary | ICD-10-CM | POA: Diagnosis not present

## 2017-01-19 ENCOUNTER — Ambulatory Visit
Admission: RE | Admit: 2017-01-19 | Discharge: 2017-01-19 | Disposition: A | Discharge status: Home / Self Care | Payer: Medicare Other | Source: Ambulatory Visit | Attending: Radiation Oncology | Admitting: Radiation Oncology

## 2017-01-19 DIAGNOSIS — C50211 Malignant neoplasm of upper-inner quadrant of right female breast: Secondary | ICD-10-CM | POA: Diagnosis not present

## 2017-01-19 DIAGNOSIS — Z803 Family history of malignant neoplasm of breast: Secondary | ICD-10-CM | POA: Diagnosis not present

## 2017-01-19 DIAGNOSIS — Z510 Encounter for antineoplastic radiation therapy: Secondary | ICD-10-CM | POA: Diagnosis not present

## 2017-01-19 DIAGNOSIS — Z170 Estrogen receptor positive status [ER+]: Secondary | ICD-10-CM | POA: Diagnosis not present

## 2017-01-19 DIAGNOSIS — C50412 Malignant neoplasm of upper-outer quadrant of left female breast: Secondary | ICD-10-CM | POA: Diagnosis not present

## 2017-01-20 ENCOUNTER — Ambulatory Visit
Admission: RE | Admit: 2017-01-20 | Discharge: 2017-01-20 | Disposition: A | Discharge status: Home / Self Care | Payer: Medicare Other | Source: Ambulatory Visit | Attending: Radiation Oncology | Admitting: Radiation Oncology

## 2017-01-20 DIAGNOSIS — Z510 Encounter for antineoplastic radiation therapy: Secondary | ICD-10-CM | POA: Diagnosis not present

## 2017-01-20 DIAGNOSIS — Z803 Family history of malignant neoplasm of breast: Secondary | ICD-10-CM | POA: Diagnosis not present

## 2017-01-20 DIAGNOSIS — C50412 Malignant neoplasm of upper-outer quadrant of left female breast: Secondary | ICD-10-CM | POA: Diagnosis not present

## 2017-01-20 DIAGNOSIS — C50211 Malignant neoplasm of upper-inner quadrant of right female breast: Secondary | ICD-10-CM | POA: Diagnosis not present

## 2017-01-20 DIAGNOSIS — Z170 Estrogen receptor positive status [ER+]: Secondary | ICD-10-CM | POA: Diagnosis not present

## 2017-01-21 ENCOUNTER — Ambulatory Visit
Admission: RE | Admit: 2017-01-21 | Discharge: 2017-01-21 | Disposition: A | Discharge status: Home / Self Care | Payer: Medicare Other | Source: Ambulatory Visit | Attending: Radiation Oncology | Admitting: Radiation Oncology

## 2017-01-21 DIAGNOSIS — Z510 Encounter for antineoplastic radiation therapy: Secondary | ICD-10-CM | POA: Diagnosis not present

## 2017-01-21 DIAGNOSIS — C50211 Malignant neoplasm of upper-inner quadrant of right female breast: Secondary | ICD-10-CM | POA: Diagnosis not present

## 2017-01-21 DIAGNOSIS — Z803 Family history of malignant neoplasm of breast: Secondary | ICD-10-CM | POA: Diagnosis not present

## 2017-01-21 DIAGNOSIS — C50412 Malignant neoplasm of upper-outer quadrant of left female breast: Secondary | ICD-10-CM | POA: Diagnosis not present

## 2017-01-21 DIAGNOSIS — Z170 Estrogen receptor positive status [ER+]: Secondary | ICD-10-CM | POA: Diagnosis not present

## 2017-01-24 ENCOUNTER — Ambulatory Visit
Admission: RE | Admit: 2017-01-24 | Discharge: 2017-01-24 | Disposition: A | Discharge status: Home / Self Care | Payer: Medicare Other | Source: Ambulatory Visit | Attending: Radiation Oncology | Admitting: Radiation Oncology

## 2017-01-24 DIAGNOSIS — C50211 Malignant neoplasm of upper-inner quadrant of right female breast: Secondary | ICD-10-CM | POA: Diagnosis not present

## 2017-01-24 DIAGNOSIS — Z803 Family history of malignant neoplasm of breast: Secondary | ICD-10-CM | POA: Diagnosis not present

## 2017-01-24 DIAGNOSIS — Z510 Encounter for antineoplastic radiation therapy: Secondary | ICD-10-CM | POA: Diagnosis not present

## 2017-01-24 DIAGNOSIS — C50412 Malignant neoplasm of upper-outer quadrant of left female breast: Secondary | ICD-10-CM | POA: Diagnosis not present

## 2017-01-24 DIAGNOSIS — Z170 Estrogen receptor positive status [ER+]: Secondary | ICD-10-CM | POA: Diagnosis not present

## 2017-01-25 ENCOUNTER — Ambulatory Visit
Admission: RE | Admit: 2017-01-25 | Discharge: 2017-01-25 | Disposition: A | Discharge status: Home / Self Care | Payer: Medicare Other | Source: Ambulatory Visit | Attending: Radiation Oncology | Admitting: Radiation Oncology

## 2017-01-25 DIAGNOSIS — C50412 Malignant neoplasm of upper-outer quadrant of left female breast: Secondary | ICD-10-CM

## 2017-01-25 DIAGNOSIS — Z510 Encounter for antineoplastic radiation therapy: Secondary | ICD-10-CM | POA: Diagnosis not present

## 2017-01-25 DIAGNOSIS — Z803 Family history of malignant neoplasm of breast: Secondary | ICD-10-CM | POA: Diagnosis not present

## 2017-01-25 DIAGNOSIS — Z170 Estrogen receptor positive status [ER+]: Secondary | ICD-10-CM | POA: Diagnosis not present

## 2017-01-25 DIAGNOSIS — C50211 Malignant neoplasm of upper-inner quadrant of right female breast: Secondary | ICD-10-CM | POA: Diagnosis not present

## 2017-01-25 MED ORDER — RADIAPLEXRX EX GEL
Freq: Once | CUTANEOUS | Status: AC
  Administered 2017-01-25: 11:00:00 via TOPICAL

## 2017-01-26 ENCOUNTER — Ambulatory Visit
Admission: RE | Admit: 2017-01-26 | Discharge: 2017-01-26 | Disposition: A | Discharge status: Home / Self Care | Payer: Medicare Other | Source: Ambulatory Visit | Attending: Radiation Oncology | Admitting: Radiation Oncology

## 2017-01-26 DIAGNOSIS — Z170 Estrogen receptor positive status [ER+]: Secondary | ICD-10-CM | POA: Diagnosis not present

## 2017-01-26 DIAGNOSIS — Z510 Encounter for antineoplastic radiation therapy: Secondary | ICD-10-CM | POA: Diagnosis not present

## 2017-01-26 DIAGNOSIS — C50412 Malignant neoplasm of upper-outer quadrant of left female breast: Secondary | ICD-10-CM | POA: Diagnosis not present

## 2017-01-26 DIAGNOSIS — Z803 Family history of malignant neoplasm of breast: Secondary | ICD-10-CM | POA: Diagnosis not present

## 2017-01-26 DIAGNOSIS — C50211 Malignant neoplasm of upper-inner quadrant of right female breast: Secondary | ICD-10-CM | POA: Diagnosis not present

## 2017-01-27 ENCOUNTER — Ambulatory Visit
Admission: RE | Admit: 2017-01-27 | Discharge: 2017-01-27 | Disposition: A | Discharge status: Home / Self Care | Payer: Medicare Other | Source: Ambulatory Visit | Attending: Radiation Oncology | Admitting: Radiation Oncology

## 2017-01-27 DIAGNOSIS — C50412 Malignant neoplasm of upper-outer quadrant of left female breast: Secondary | ICD-10-CM | POA: Diagnosis not present

## 2017-01-27 DIAGNOSIS — C50211 Malignant neoplasm of upper-inner quadrant of right female breast: Secondary | ICD-10-CM | POA: Diagnosis not present

## 2017-01-27 DIAGNOSIS — Z803 Family history of malignant neoplasm of breast: Secondary | ICD-10-CM | POA: Diagnosis not present

## 2017-01-27 DIAGNOSIS — Z170 Estrogen receptor positive status [ER+]: Secondary | ICD-10-CM | POA: Diagnosis not present

## 2017-01-27 DIAGNOSIS — Z510 Encounter for antineoplastic radiation therapy: Secondary | ICD-10-CM | POA: Diagnosis not present

## 2017-01-28 ENCOUNTER — Ambulatory Visit
Admission: RE | Admit: 2017-01-28 | Discharge: 2017-01-28 | Disposition: A | Discharge status: Home / Self Care | Payer: Medicare Other | Source: Ambulatory Visit | Attending: Radiation Oncology | Admitting: Radiation Oncology

## 2017-01-28 DIAGNOSIS — C50211 Malignant neoplasm of upper-inner quadrant of right female breast: Secondary | ICD-10-CM | POA: Diagnosis not present

## 2017-01-28 DIAGNOSIS — Z803 Family history of malignant neoplasm of breast: Secondary | ICD-10-CM | POA: Diagnosis not present

## 2017-01-28 DIAGNOSIS — Z510 Encounter for antineoplastic radiation therapy: Secondary | ICD-10-CM | POA: Diagnosis not present

## 2017-01-28 DIAGNOSIS — Z170 Estrogen receptor positive status [ER+]: Secondary | ICD-10-CM | POA: Diagnosis not present

## 2017-01-28 DIAGNOSIS — C50412 Malignant neoplasm of upper-outer quadrant of left female breast: Secondary | ICD-10-CM | POA: Diagnosis not present

## 2017-01-31 ENCOUNTER — Ambulatory Visit
Admission: RE | Admit: 2017-01-31 | Discharge: 2017-01-31 | Disposition: A | Discharge status: Home / Self Care | Payer: Medicare Other | Source: Ambulatory Visit | Attending: Radiation Oncology | Admitting: Radiation Oncology

## 2017-01-31 DIAGNOSIS — C50412 Malignant neoplasm of upper-outer quadrant of left female breast: Secondary | ICD-10-CM | POA: Diagnosis not present

## 2017-01-31 DIAGNOSIS — C50211 Malignant neoplasm of upper-inner quadrant of right female breast: Secondary | ICD-10-CM | POA: Diagnosis not present

## 2017-01-31 DIAGNOSIS — Z510 Encounter for antineoplastic radiation therapy: Secondary | ICD-10-CM | POA: Diagnosis not present

## 2017-01-31 DIAGNOSIS — Z170 Estrogen receptor positive status [ER+]: Secondary | ICD-10-CM | POA: Diagnosis not present

## 2017-01-31 DIAGNOSIS — Z803 Family history of malignant neoplasm of breast: Secondary | ICD-10-CM | POA: Diagnosis not present

## 2017-02-01 ENCOUNTER — Ambulatory Visit
Admission: RE | Admit: 2017-02-01 | Discharge: 2017-02-01 | Disposition: A | Discharge status: Home / Self Care | Payer: Medicare Other | Source: Ambulatory Visit | Attending: Radiation Oncology | Admitting: Radiation Oncology

## 2017-02-01 DIAGNOSIS — C50211 Malignant neoplasm of upper-inner quadrant of right female breast: Secondary | ICD-10-CM | POA: Diagnosis not present

## 2017-02-01 DIAGNOSIS — Z170 Estrogen receptor positive status [ER+]: Secondary | ICD-10-CM | POA: Diagnosis not present

## 2017-02-01 DIAGNOSIS — C50412 Malignant neoplasm of upper-outer quadrant of left female breast: Secondary | ICD-10-CM | POA: Diagnosis not present

## 2017-02-01 DIAGNOSIS — Z803 Family history of malignant neoplasm of breast: Secondary | ICD-10-CM | POA: Diagnosis not present

## 2017-02-01 DIAGNOSIS — Z510 Encounter for antineoplastic radiation therapy: Secondary | ICD-10-CM | POA: Diagnosis not present

## 2017-02-02 ENCOUNTER — Ambulatory Visit
Admission: RE | Admit: 2017-02-02 | Discharge: 2017-02-02 | Disposition: A | Discharge status: Home / Self Care | Payer: Medicare Other | Source: Ambulatory Visit | Attending: Radiation Oncology | Admitting: Radiation Oncology

## 2017-02-02 DIAGNOSIS — C50412 Malignant neoplasm of upper-outer quadrant of left female breast: Secondary | ICD-10-CM | POA: Diagnosis not present

## 2017-02-02 DIAGNOSIS — Z170 Estrogen receptor positive status [ER+]: Secondary | ICD-10-CM | POA: Diagnosis not present

## 2017-02-02 DIAGNOSIS — C50211 Malignant neoplasm of upper-inner quadrant of right female breast: Secondary | ICD-10-CM | POA: Diagnosis not present

## 2017-02-02 DIAGNOSIS — Z803 Family history of malignant neoplasm of breast: Secondary | ICD-10-CM | POA: Diagnosis not present

## 2017-02-02 DIAGNOSIS — Z510 Encounter for antineoplastic radiation therapy: Secondary | ICD-10-CM | POA: Diagnosis not present

## 2017-02-03 ENCOUNTER — Ambulatory Visit
Admission: RE | Admit: 2017-02-03 | Discharge: 2017-02-03 | Disposition: A | Discharge status: Home / Self Care | Payer: Medicare Other | Source: Ambulatory Visit | Attending: Radiation Oncology | Admitting: Radiation Oncology

## 2017-02-03 DIAGNOSIS — Z170 Estrogen receptor positive status [ER+]: Secondary | ICD-10-CM | POA: Diagnosis not present

## 2017-02-03 DIAGNOSIS — C50211 Malignant neoplasm of upper-inner quadrant of right female breast: Secondary | ICD-10-CM | POA: Diagnosis not present

## 2017-02-03 DIAGNOSIS — C50412 Malignant neoplasm of upper-outer quadrant of left female breast: Secondary | ICD-10-CM | POA: Diagnosis not present

## 2017-02-03 DIAGNOSIS — Z510 Encounter for antineoplastic radiation therapy: Secondary | ICD-10-CM | POA: Diagnosis not present

## 2017-02-03 DIAGNOSIS — Z803 Family history of malignant neoplasm of breast: Secondary | ICD-10-CM | POA: Diagnosis not present

## 2017-02-04 ENCOUNTER — Ambulatory Visit
Admission: RE | Admit: 2017-02-04 | Discharge: 2017-02-04 | Disposition: A | Discharge status: Home / Self Care | Payer: Medicare Other | Source: Ambulatory Visit | Attending: Radiation Oncology | Admitting: Radiation Oncology

## 2017-02-04 DIAGNOSIS — C50211 Malignant neoplasm of upper-inner quadrant of right female breast: Secondary | ICD-10-CM | POA: Diagnosis not present

## 2017-02-04 DIAGNOSIS — C50412 Malignant neoplasm of upper-outer quadrant of left female breast: Secondary | ICD-10-CM | POA: Diagnosis not present

## 2017-02-04 DIAGNOSIS — Z510 Encounter for antineoplastic radiation therapy: Secondary | ICD-10-CM | POA: Diagnosis not present

## 2017-02-04 DIAGNOSIS — Z803 Family history of malignant neoplasm of breast: Secondary | ICD-10-CM | POA: Diagnosis not present

## 2017-02-04 DIAGNOSIS — Z170 Estrogen receptor positive status [ER+]: Secondary | ICD-10-CM | POA: Diagnosis not present

## 2017-02-04 NOTE — Progress Notes (Signed)
[facility]  [phone] [phone]  Clinic Follow Up Note   Patient Care Team: Redmond School, MD as PCP - General (Internal Medicine) Elmarie Shiley, MD as Consulting Physician (Nephrology) Leonie Man, MD as Consulting Physician (Cardiology) Alphonsa Overall, MD as Consulting Physician (General Surgery) Kyung Rudd, MD as Consulting Physician (Radiation Oncology) Truitt Merle, MD as Consulting Physician (Hematology) 02/07/2017   CHIEF COMPLAINTS:  Bilateral invasive breast cancer  Oncology History   Cancer Staging Malignant 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, estrogen receptor positive ([place]) Staging form: Breast, AJCC 8th Edition - Clinical: Stage IA (cT1c, cN0, cM0, G1, ER: Positive, PR: Positive, HER2: Negative) - Unsigned       Malignant 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, estrogen receptor positive ([place])   10/20/2016 Initial Biopsy    Diagnosis Breast, right, needle core biopsy, 11:30 o'clock - INVASIVE DUCTAL CARCINOMA,      10/20/2016 Initial Diagnosis    Malignant 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, estrogen receptor positive ([place])      10/20/2016 Receptors her2    Estrogen Receptor: 100%, POSITIVE, STRONG STAINING INTENSITY Progesterone Receptor: 100%, POSITIVE, STRONG STAINING INTENSITY Proliferation Marker Ki67: 5% HER2 NEGATIVE       10/20/2016 Mammogram    Diagnostic mammogram showed persistent distortion corresponds to a vaguely palpable irregular mass and is suspicious for malignancy.Targeted ultrasound is performed, showing an irregular hypoechoic mass with posterior acoustic shadowing in the 11:30 o'clock location of the right breast 4 cm from the nipple which measures 0.9 x 0.9 x0.8 cm. There is associated internal vascularity. Evaluation of the axilla is negative for adenopathy.      11/01/2016 Imaging    B/l BREAST MRI: IMPRESSION: 1. There is  a 1.5 cm biopsy proven malignancy in the upper slightly outer quadrant of the right breast. No additional sites of disease are identified.  2. There is an indeterminate 6 mm enhancing mass in the upper-outer left breast.      11/11/2016 Pathology Results    Diagnosis Breast, left, needle core biopsy, upper outer - INVASIVE DUCTAL CARCINOMA. - ATYPICAL LOBULAR HYPERPLASIA. - FIBROCYSTIC CHANGES AND PSEUDOANGIOMATOUS STROMAL HYPERPLASIA ([place]).      11/11/2016 Receptors her2     Estrogen Receptor: 100%, POSITIVE, STRONG STAINING INTENSITY Progesterone Receptor: 10%, POSITIVE, WEAK STAINING INTENSITY Proliferation Marker Ki67: 2% HER2 NEGATIVE       12/03/2016 Genetic Testing    Germline genetic testing was performed through [facility]'s Common Hereditary Cancers Panel + [facility]'s Melanoma Panel. This custom panel includes analysis of the following 51 genes: APC, ATM, AXIN2, BAP1, BARD1, BMPR1A, BRCA1, BRCA2, BRIP1, CDH1, CDK4, CDKN2A, CHEK2, CTNNA1, DICER1, EPCAM, GREM1, HOXB13, KIT, MEN1, MITF, MLH1, MSH2, MSH3, MSH6, MUTYH, NBN, NF1, NTHL1, PALB2, PDGFRA, PMS2, POLD1, POLE, POT1, PTEN, RAD50, RAD51C, RAD51D, RB1, SDHA, SDHB, SDHC, SDHD, SMAD4, SMARCA4, STK11, TP53, TSC1, TSC2, and VHL.  A variant of uncertain significance (VUS) was noted in BARD1.       12/14/2016 Miscellaneous    Oncotype Recurrence score of 16 10-year risk of distant recurrence with Tamoxifen alone is 10%      12/14/2016 Surgery    BILATERAL BREAST LUMPECTOMY WITH BILATERAL  RADIOACTIVE SEED AND BILATERAL AXILLARY  SENTINEL LYMPH NODE BIOPSY ERAS PATHWAY by Dr. Lucia Gaskins on 12/14/16      12/14/2016 Pathology Results    Diagnosis 12/14/16 1. Breast, lumpectomy, Left w/seed - INVASIVE DUCTAL CARCINOMA, 1 CM. - ATYPICAL LOBULAR HYPERPLASIA ([place]). -  PREVIOUS BIOPSY SITE. - INVASIVE CARCINOMA 0.2 CM FROM LATERAL MARGIN. - ATYPICA LOBULAR HYPERPLASIA LESS THAN 0.1 CM FROM LATERAL MARGIN. 2. Breast, lumpectomy, Right  w/seed - INVASIVE DUCTAL CARCINOMA, 1.1 CM. - FIBROCYSTIC CHANGES. - PREVIOUS BIOPSY SITE. - INVASIVE CARCINOMA 1.1 CM FROM POSTERIOR MARGIN. 3. Lymph node, sentinel, biopsy, Right Axillary Contents - BENIGN FIBROADIPOSE TISSUE. - NO LYMPH NODE TISSUE OR MALIGNANCY. 4. Lymph node, sentinel, biopsy, Left Axillary - ONE BENIGN LYMPH NODE (0/1).       01/17/2017 -  Radiation Therapy    Radiation with Dr. Lisbeth Renshaw starting on 01/17/17, plan to complete on 02/11/17            HISTORY OF PRESENTING ILLNESS:  [name] 76 y.o. female is here because of newly diagnosed bilateral breast cancer. She reports to not feeling the lumps herself and cancer was found on screening mammogram followed by MR. 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 In August of 2018, she was also found to have invasive ductal carcinoma in the right breast as well. 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 She has a family history of breast cancer including her sister and daughter, both in their early 65s.   She began taking estrogen at 36 following a hysterectomy and bilateral salpingo-oophorectomy due to an ectopic pregnancy and ovarian cyst. 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   She is very active at home as she cleans her house and mows her own line. Per her husband, she is "very hyper and has trouble with her balance." She is otherwise well at home.   CURRENT THERAPY: Radiation with Dr. Lisbeth Renshaw started on 01/17/17, plan to complete on 02/11/17 Exemestane to start in 2-3 weeks    INTERVAL HISTORY:  [name] is here for a follow post bilateral lumpectomy. She presents to the clinic today accompanied by her husband.  She notes she is almost done with radiation and she notes redness and slight soreness of her breasts. She uses cream regularly at night. She denies any other side effects from  radiation, she is overall tolerating well.  Since stopping hormonal replacements she has not experienced hot flashes. Her A1c is 5.7 as she is diabetic. Her cholesterol and triglycerides are not good and she recently had normal lab with quinol coq10. She does have significant arthritis. She notes to having issues with her [facility] scripts and her insurance.     MEDICAL HISTORY:  Past Medical History:  Diagnosis Date  . Anxiety   . Arthritis     osteoarthritis  . Breast cancer in female [facility])    Bilateral  . CAD S/P percutaneous coronary angioplasty 2007; March 2011   [employer] 3.0 mm 20 mm postdilated 3.6 mm in early mid LAD; status post ISR Cutting Balloon PTCA and March 11 along with PCI of distal mid lesion with a 3.0 mm 12 mm MultiLink vision BMS; the proximal stent causes jailing of SP1 and SP2 with ostial 70-80% lesions  . Cancer (HCC)    Melanoma, Squamous cell Carcinoma  . CHF (congestive heart failure) ([place])   . Chronic kidney disease    stage 2   . Dementia    mild  . Diabetes mellitus type 2 with neurological manifestations ([place])   . Dyslipidemia, goal LDL below 70   . Full dentures   . Genetic testing 12/03/2016   Germline  genetic testing was performed through [facility]'s Common Hereditary Cancers Panel + [facility]'s Melanoma Panel. This custom panel includes analysis of the following 51 genes: APC, ATM, AXIN2, BAP1, BARD1, BMPR1A, BRCA1, BRCA2, BRIP1, CDH1, CDK4, CDKN2A, CHEK2, CTNNA1, DICER1, EPCAM, GREM1, HOXB13, KIT, MEN1, MITF, MLH1, MSH2, MSH3, MSH6, MUTYH, NBN, NF1, NTHL1, PALB2, PDGFRA, PMS2, POLD1, POL  . GERD (gastroesophageal reflux disease)   . Headache(784.0)   . History of hematuria    Followed by Dr. Gaynelle Arabian  . Hypertension, essential, benign   . Nausea & vomiting 10/2015  . Peripheral neuropathy   . Stroke ([place])   . TIA (transient ischemic attack)    multiple in the past  . Wears glasses     SURGICAL HISTORY: Past Surgical History:  Procedure  Laterality Date  . ABDOMINAL HYSTERECTOMY    . APPENDECTOMY    . BLADDER SUSPENSION    . CARDIAC CATHETERIZATION  02/24/2006   85% stenosis in the proximal portion of LAD-arrangements made for PCI on 02/25/2006  . CARDIAC CATHETERIZATION  02/25/2006   80% LAD lesion stented with a 3x57m Liberte stent resulting in reduction of 80% lesion to 0% residual  . CARDIAC CATHETERIZATION  04/26/2006   Medical management  . CARDIAC CATHETERIZATION  06/24/2009   60-70% re-stenosis in the proximal LAD. A 3.25x15 cutting balloon, 3 inflations - 14atm-38sec, 13atm-39sec, and 12atm-40sec reduced to less than 10%. 60% stenosis of the mid/distal LAD stented with a 3x171mMultilink stent.  . Marland KitchenARDIAC CATHETERIZATION  07/09/2009   Medical management  . CAROTID DOPPLER  06/24/2009   40-59% R ICA stenosis. No significant ICA stenosis noted  . CHOLECYSTECTOMY    . DILATION AND CURETTAGE OF UTERUS    . JOINT REPLACEMENT Left   . NM MYOVIEW LTD  11/16/2011   5 beats a PVC during recovery.  No skin or infarction.  Reached 5 METs, EKG negative for ischemia   . SHOULDER ARTHROSCOPY  10/15  . SHOULDER ARTHROSCOPY  1995   left  . TOTAL KNEE ARTHROPLASTY  2005   left  . TRANSTHORACIC ECHOCARDIOGRAM  10/2015   EF 55-60 %. Mild LVH. Mild pulmonary hypertension. Normal valves.  . TRANSTHORACIC ECHOCARDIOGRAM  12/31/2009/July 2015   IsLV size & function, Gr 1 DD w/ mild TR.;; b)09/2013: 55-60%. Grade 1 DD, mild aortic sclerosis  . WRIST ARTHROPLASTY  2010   cancer lt wrist    SOCIAL HISTORY: Social History   Socioeconomic History  . Marital status: Married    Spouse name: JeSonia Side. Number of children: 3  . Years of education: 1166. Highest education level: Not on file  Social Needs  . Financial resource strain: Not on file  . Food insecurity - worry: Not on file  . Food insecurity - inability: Not on file  . Transportation needs - medical: Not on file  . Transportation needs - non-medical: Not on file    Occupational History  . Occupation: retired    EmFish farm managerRETIRED  Tobacco Use  . Smoking status: Never Smoker  . Smokeless tobacco: Never Used  Substance and Sexual Activity  . Alcohol use: No  . Drug use: No  . Sexual activity: No  Other Topics Concern  . Not on file  Social History Narrative   She is a married mother of 3,51grandmother of 2.  Usually very active and out about the house.  But currently over last 6-8 weeks has been incapacitated.  She never smoked and does not drink alcohol.  FAMILY HISTORY: Family History  Problem Relation Age of Onset  . Diabetes Mother   . Hypertension Mother   . Kidney disease Mother   . Hypertension Father   . Emphysema Father   . Heart attack Father   . Heart disease Father   . Arthritis Sister   . Diabetes Sister   . Hypertension Sister   . Breast cancer Sister 20       treated with mastectomy  . Cervical cancer Sister 61  . Hypertension Brother   . Hyperlipidemia Brother   . Diabetes Brother   . Stroke Brother   . Diabetes Sister   . Hypertension Sister   . Stomach cancer Sister 22  . Hypertension Brother   . ALS Brother        d.62s  . Breast cancer Daughter 84       triple negative treated with lumpectomy, chemo, radiation  . Heart attack Daughter 60       d.45  . COPD Daughter   . Cancer Paternal Aunt        unspecified type  . Cancer Paternal Uncle        unspecified type  . Cancer Paternal Uncle        unspecified type    ALLERGIES:  is allergic to lipitor [atorvastatin]; nsaids; reglan [metoclopramide]; and ultram [tramadol].  MEDICATIONS:  Current Outpatient Medications  Medication Sig Dispense Refill  . allopurinol (ZYLOPRIM) 100 MG tablet Take 1 tablet (100 mg total) by mouth daily. 30 tablet 6  . Biotin w/ Vitamins C & E (HAIR SKIN & NAILS GUMMIES PO) Take 2 tablets by mouth daily.     . bumetanide (BUMEX) 1 MG tablet Take 2 mg by mouth daily.     . busPIRone (BUSPAR) 7.5 MG tablet Take 7.5 mg by  mouth 2 (two) times daily.    . Calcium Carb-Cholecalciferol (CALCIUM 600 + D PO) Take 1 tablet by mouth at bedtime.     . Cholecalciferol 2000 units CAPS Take 2,000 Units by mouth daily.    . clopidogrel (PLAVIX) 75 MG tablet Take 1 tablet (75 mg total) by mouth daily. 90 tablet 3  . COD LIVER OIL PO Take 415 mg by mouth daily.    . colchicine 0.6 MG tablet Take one tablet daily during acute gout attack for 3 days, discontinue after this. Resume in the event of another acute gout flare up. 30 tablet 5  . CRANBERRY PO Take 1 capsule by mouth daily.    . diphenhydrAMINE (BENADRYL) 50 MG capsule Take 50 mg by mouth at bedtime.    . Fish Oil-Krill Oil (KRILL OIL PLUS PO) Take 1 capsule by mouth daily.     Marland Kitchen gabapentin (NEURONTIN) 300 MG capsule Take 600 mg by mouth at bedtime.     Marland Kitchen HYDROcodone-acetaminophen (NORCO/VICODIN) 5-325 MG tablet Take 1-2 tablets by mouth every 6 (six) hours as needed for moderate pain. 20 tablet 0  . Iron-FA-B Cmp-C-Biot-Probiotic (FUSION PLUS) CAPS takes 1 tablet daily  9  . LORazepam (ATIVAN) 0.5 MG tablet Take 0.5 mg every 8 (eight) hours as needed by mouth.     . losartan (COZAAR) 100 MG tablet take 1 tablet daily  3  . Melatonin 5 MG CAPS Take 5 mg by mouth at bedtime.    . Methylfol-Methylcob-Acetylcyst (CEREFOLIN NAC) 6-2-600 MG TABS TAKE 1 CAPLET BY MOUTH ONCE DAILY. 90 each 1  . metoprolol succinate (TOPROL XL) 50 MG 24 hr tablet Take 0.5 tablet (  25 mg total) by mouth daily. 45 tablet 3  . pantoprazole (PROTONIX) 40 MG tablet TAKE 1 TABLET DAILY (CONTACT OFFICE FOR ADDITIONAL REFILLS, FINAL ATTEMPT) 90 tablet 0  . pramipexole (MIRAPEX) 0.25 MG tablet TAKE 1 TABLET AT 4 P.M. AND 2 TABLETS (0.5 MG) AT BEDTIME 270 tablet 0  . Resveratrol (RESERVAPAK PO) Take 1 tablet by mouth every evening.     . rosuvastatin (CRESTOR) 20 MG tablet Take 20 mg by mouth at bedtime.    . topiramate (TOPAMAX) 25 MG tablet Take 1 tablet (25 mg total) by mouth 2 (two) times daily. 180  tablet 1  . exemestane (AROMASIN) 25 MG tablet Take 1 tablet (25 mg total) daily after breakfast by mouth. 90 tablet 3  . nitroGLYCERIN (NITROLINGUAL) 0.4 MG/SPRAY spray Place 1 spray under the tongue every 5 (five) minutes x 3 doses as needed for chest pain.     . non-metallic deodorant Jethro Poling) MISC Apply 1 application topically daily as needed.    . Wound Cleansers (RADIAPLEX EX) Apply topically.     No current facility-administered medications for this visit.     REVIEW OF SYSTEMS:   Constitutional: Denies fevers, chills or abnormal night sweats Eyes: Denies blurriness of vision, double vision or watery eyes Ears, nose, mouth, throat, and face: Denies mucositis or sore throat Respiratory: Denies cough, dyspnea or wheezes Cardiovascular: Denies palpitation, chest discomfort or lower extremity swelling Gastrointestinal:  Denies nausea, heartburn or change in bowel habits Skin: Denies abnormal skin rashes Breast: (+) soreness and redness of breasts from radiation  Lymphatics: Denies new lymphadenopathy or easy bruising MSK: (+) Arthritis  Neurological:Denies numbness, tingling or new weaknesses Behavioral/Psych: Mood is stable, no new changes  All other systems were reviewed with the patient and are negative.  PHYSICAL EXAMINATION:  ECOG PERFORMANCE STATUS: 0 - Asymptomatic  Vitals:   02/07/17 1323  BP: 132/79  Pulse: 97  Resp: 18  Temp: 97.8 F (36.6 C)  SpO2: 100%   Filed Weights   02/07/17 1323  Weight: 180 lb 4.8 oz (81.8 kg)    GENERAL:alert, no distress and comfortable SKIN: skin color, texture, turgor are normal, no rashes or significant lesions EYES: normal, conjunctiva are pink and non-injected, sclera clear OROPHARYNX:no exudate, no erythema and lips, buccal mucosa, and tongue normal  NECK: supple, thyroid normal size, non-tender, without nodularity LYMPH:  no palpable lymphadenopathy in the cervical, axillary or inguinal LUNGS: clear to auscultation and  percussion with normal breathing effort HEART: regular rate & rhythm and no murmurs and no lower extremity edema ABDOMEN:abdomen soft, non-tender and normal bowel sounds Musculoskeletal:no cyanosis of digits and no clubbing  PSYCH: alert & oriented x 3 with fluent speech NEURO: no focal motor/sensory deficits Breasts: Breast inspection showed them to be symmetrical with no nipple discharge. Palpation of the breasts and axilla revealed no obvious mass that I could appreciate.  LABORATORY DATA:  I have reviewed the data as listed CBC Latest Ref Rng & Units 12/07/2016 11/13/2016 05/06/2016  WBC 4.0 - 10.5 K/uL 6.4 9.0 8.9  Hemoglobin 12.0 - 15.0 g/dL 12.6 12.2 12.5  Hematocrit 36.0 - 46.0 % 38.5 35.6(L) 39.0  Platelets 150 - 400 K/uL 207 242 196   CMP Latest Ref Rng & Units 12/07/2016 11/13/2016 05/06/2016  Glucose 65 - 99 mg/dL 119(H) 111(H) 104(H)  BUN 6 - 20 mg/dL 23(H) 23(H) 13  Creatinine 0.44 - 1.00 mg/dL 1.51(H) 1.87(H) 1.42(H)  Sodium 135 - 145 mmol/L 139 137 137  Potassium 3.5 -  5.1 mmol/L 4.9 4.3 5.1  Chloride 101 - 111 mmol/L 105 103 100(L)  CO2 22 - 32 mmol/L '25 25 25  '$ Calcium 8.9 - 10.3 mg/dL 10.0 9.7 9.9  Total Protein 6.5 - 8.1 g/dL - 7.1 -  Total Bilirubin 0.3 - 1.2 mg/dL - 0.3 -  Alkaline Phos 38 - 126 U/L - 68 -  AST 15 - 41 U/L - 25 -  ALT 14 - 54 U/L - 19 -    PATHOLOGY  Diagnosis 12/14/16 1. Breast, lumpectomy, Left w/seed - INVASIVE DUCTAL CARCINOMA, 1 CM. - ATYPICAL LOBULAR HYPERPLASIA ([place]). - PREVIOUS BIOPSY SITE. - INVASIVE CARCINOMA 0.2 CM FROM LATERAL MARGIN. - ATYPICA LOBULAR HYPERPLASIA LESS THAN 0.1 CM FROM LATERAL MARGIN. 2. Breast, lumpectomy, Right w/seed - INVASIVE DUCTAL CARCINOMA, 1.1 CM. - FIBROCYSTIC CHANGES. - PREVIOUS BIOPSY SITE. - INVASIVE CARCINOMA 1.1 CM FROM POSTERIOR MARGIN. 3. Lymph node, sentinel, biopsy, Right Axillary Contents - BENIGN FIBROADIPOSE TISSUE. - NO LYMPH NODE TISSUE OR MALIGNANCY. 4. Lymph node, sentinel, biopsy,  Left Axillary - ONE BENIGN LYMPH NODE (0/1). Microscopic Comment 1. BREAST, INVASIVE TUMOR Procedure: Localized lumpectomy and one sentinel lymph node. Laterality: Left breast. Tumor Size: 1 cm. Histologic Type: Ductal. Grade: I. Tubular Differentiation: 1. Nuclear Pleomorphism: 2. Mitotic Count: 1. Ductal Carcinoma in Situ (DCIS): N/A. Extent of Tumor: Skin: N/A. Nipple: N/A. Skeletal muscle: N/A. 1 of 4 FINAL for [place], Emmerson G ([id number]) Microscopic Comment(continued) Margins: Free of tumor. Invasive carcinoma, distance from closest margin: 0.3 cm from lateral margin. DCIS, distance from closest margin: N/A. Regional Lymph Nodes: Number of Lymph Nodes Examined: 1. Number of Sentinel Lymph Nodes Examined: 1. Lymph Nodes with Macrometastases: 0. Lymph Nodes with Micrometastases: 0. Lymph Nodes with Isolated Tumor Cells: 0. Breast Prognostic Profile: Case [id number]. Estrogen Receptor: 100%, positive, strong staining. Progesterone Receptor: 10%, positive, weak staining. Her2: Negative, ratio 1.54. Ki-67: 2%. Best tumor block for sendout testing: 1E. Pathologic Stage Classification (pTNM, AJCC 8th Edition): Primary Tumor (pT): pT1b. Regional Lymph Nodes (pN): pN0. Distant Metastases (pM): pMX. 2. BREAST, INVASIVE TUMOR Procedure: Localized lumpectomy. Laterality: Right breast. Tumor Size: 1.1 cm. Histologic Type: Ductal. Grade: I. Tubular Differentiation: 1. Nuclear Pleomorphism: 2. Mitotic Count: 1. Ductal Carcinoma in Situ (DCIS): N/A. Extent of Tumor: Skin: N/A. Nipple: N/A. Skeletal muscle: N/A. Margins: Free of tumor. Invasive carcinoma, distance from closest margin: 1.1 cm from posterior margin. DCIS, distance from closest margin: N/A. Regional Lymph Nodes: Number of Lymph Nodes Examined: 0. Number of Sentinel Lymph Nodes Examined: 0. Lymph Nodes with Macrometastases: N/A. Lymph Nodes with Micrometastases: N/A. Lymph Nodes with Isolated Tumor Cells:  N/A. Breast Prognostic Profile: Case [id number]. Estrogen Receptor: 100%, positive, strong staining. Progesterone Receptor: 100%, positive, strong staining. Her2: Negative, ratio 1.28. Ki-67: 5%. Best tumor block for sendout testing: 68F. 2 of 4 FINAL for Suski, Kyrstal G ([id number]) Microscopic Comment(continued) Pathologic Stage Classification (pTNM, AJCC 8th Edition): Primary Tumor (pT): pT1c. Regional Lymph Nodes (pN): pNX. Distant Metastases (pM): pMX.    Diagnosis 11/11/2016 Breast, left, needle core biopsy, upper outer - INVASIVE MAMMARY CARCINOMA. - ATYPICAL LOBULAR HYPERPLASIA. - FIBROCYSTIC CHANGES AND PSEUDOANGIOMATOUS STROMAL HYPERPLASIA ([place]).  Diagnosis 10/10/2016 Breast, right, needle core biopsy, 11:30 o'clock - INVASIVE DUCTAL CARCINOMA, SEE COMMENT.  Estrogen Receptor: 100%, POSITIVE, STRONG STAINING INTENSITY Progesterone Receptor: 100%, POSITIVE, STRONG STAINING INTENSITY Proliferation Marker Ki67: 5% HER2 - NEGATIVE  Diagnosis 09/05/15 Skin , Left forearm - INVASIVE WELL DIFFERENTIATED SQUAMOUS CELL CARCINOMA, MEASURING 0.6 CM IN GREATEST DIMENSION. - SOLAR ELASTOSIS  IS PRESENT. - MARGINS ARE NEGATIVE. - SEE ONCOLOGY TEMPLATE.    Oncotype 12/14/16    RADIOGRAPHIC STUDIES: I have personally reviewed the radiological images as listed and agreed with the findings in the report. No results found.    Diagnostic mammogram on 10/20/16  showed persistent distortion corresponds to a vaguely palpable irregular mass and is suspicious for malignancy.Targeted ultrasound is performed, showing an irregular hypoechoic mass with posterior acoustic shadowing in the 11:30 o'clock location of the right breast 4 cm from the nipple which measures 0.9 x 0.9 x0.8 cm. There is associated internal vascularity. Evaluation of the axilla is negative for adenopathy.   Bone Density Scan 10/28/14 ASSESSMENT: BMD as determined from Femur Neck Right is 0.818 g/cm2 with a T-Score of  -1.6. This patient is considered osteopenic according to [facility]) criteria.Compared with the prior study on 10/24/2013, the BMD of the lumbar spine and right femoral neck shows no statistically significant change. Compared with the prior study on 11/17/2007, the BMD of the left forearmshows no statistically significant change. (L-3-4 was excluded due to advanced degenerative changes.)(Patient does not meet criteria for FRAX assessment.)   ASSESSMENT & PLAN: 76 y.o. postmenopausal woman, with past medical history of coronary artery disease, hypertension, diabetes, CKD, presented with screening discovered bilateral breast cancer.  1.  Bilateral breast ductal carcinoma, malignant neoplasm of upper inner quadrant of right breast, pT1cN0M0 and upper outer quadrant of left breast, pT1bN0M0, stage IA, ER+/PR+/HER2- - I discussed her mammogram, ultrasound, breast MRI, and 2 biopsy results with patient in details.  -She underwent Bilateral Lumpectomy and bilateral SLNB by Dr. Lucia Gaskins on 12/14/16.  I reviewed her surgical pathology results with her in details, surgical margins were negative.  - Her Oncotype results returned low risk, recurrence score 16, which predicts 10-year of distant recurrence 10% with tamoxifen, she will not need chemotherapy.  -She started radiation with Dr. Lisbeth Renshaw on 01/17/17 and plan to complete on 02/11/17, tolerating well.  --Given the strong ER and PR positivity, I do recommend adjuvant aromatase inhibitor to reduce her risk of cancer recurrence,  The potential benefit and side effects, which includes but not limited to, hot flash, skin and vaginal dryness, metabolic changes ( increased blood glucose, cholesterol, weight, etc.), slightly in increased risk of cardiovascular disease, cataracts, muscular and joint discomfort, osteopenia and osteoporosis, etc, were discussed with her in great details. She is interested, and we'll start after she completes  radiation. -I suggest exemestane due to her arthritis. If her exemestane co-pay is too high we can try anastrozole next.  She can start in 2 weeks. -F/u in 2 months    2. Genetics -Due to her family history of breast cancer, she was referred to genetics. she underwent genetic testing   -She is positive for variant of uncertain significance identified in [place]. Negative for other gene mutations.   3. HTN, DM, CAD -She'll continue medication and follow-up with her primary care physician -DM controlled   4. Arthritis  -We discussed her trying exemestane and if she cannot tolerate joint pain we can switch her to another AI.   5. Osteopenia  -2016 bone density, right femur shows osteopenia  -Next Bone Density scan at breast center in 02/2017    PLAN -Lab and f/u in 2 months  -DEXA in one month at [facility]  -Complete radiation on 02/11/17 -she will start exemestane in 2-3 weeks, prescription was called in today   Orders Placed This Encounter  Procedures  . DG  Bone Density    Standing Status:   Future    Standing Expiration Date:   02/07/2018    Order Specific Question:   Reason for Exam (SYMPTOM  OR DIAGNOSIS REQUIRED)    Answer:   screening    Order Specific Question:   Preferred imaging location?    Answer:   [facility]    All questions were answered. The patient knows to call the clinic with any problems, questions or concerns. I spent 25 minutes counseling the patient face to face. The total time spent in the appointment was 30 minutes and more than 50% was on counseling.  This document serves as a record of services personally performed by Truitt Merle, MD. It was created on her behalf by Joslyn Devon, a trained medical scribe. The creation of this record is based on the scribe's personal observations and the provider's statements to them.    I have reviewed the above documentation for accuracy and completeness, and I agree with the above.     Truitt Merle, MD 02/07/2017

## 2017-02-07 ENCOUNTER — Ambulatory Visit
Admission: RE | Admit: 2017-02-07 | Discharge: 2017-02-07 | Disposition: A | Discharge status: Home / Self Care | Payer: Medicare Other | Source: Ambulatory Visit | Attending: Radiation Oncology | Admitting: Radiation Oncology

## 2017-02-07 ENCOUNTER — Encounter: Payer: Self-pay | Admitting: Hematology

## 2017-02-07 ENCOUNTER — Ambulatory Visit (HOSPITAL_BASED_OUTPATIENT_CLINIC_OR_DEPARTMENT_OTHER): Payer: Medicare Other | Admitting: Hematology

## 2017-02-07 VITALS — BP 132/79 | HR 97 | Temp 97.8°F | Resp 18 | Ht 66.5 in | Wt 180.3 lb

## 2017-02-07 DIAGNOSIS — E119 Type 2 diabetes mellitus without complications: Secondary | ICD-10-CM | POA: Diagnosis not present

## 2017-02-07 DIAGNOSIS — C50911 Malignant neoplasm of unspecified site of right female breast: Secondary | ICD-10-CM | POA: Insufficient documentation

## 2017-02-07 DIAGNOSIS — M8580 Other specified disorders of bone density and structure, unspecified site: Secondary | ICD-10-CM | POA: Diagnosis not present

## 2017-02-07 DIAGNOSIS — Z170 Estrogen receptor positive status [ER+]: Secondary | ICD-10-CM | POA: Diagnosis not present

## 2017-02-07 DIAGNOSIS — C50412 Malignant neoplasm of upper-outer quadrant of left female breast: Secondary | ICD-10-CM | POA: Diagnosis not present

## 2017-02-07 DIAGNOSIS — C50211 Malignant neoplasm of upper-inner quadrant of right female breast: Secondary | ICD-10-CM | POA: Diagnosis not present

## 2017-02-07 DIAGNOSIS — Z510 Encounter for antineoplastic radiation therapy: Secondary | ICD-10-CM | POA: Insufficient documentation

## 2017-02-07 DIAGNOSIS — Z803 Family history of malignant neoplasm of breast: Secondary | ICD-10-CM | POA: Diagnosis not present

## 2017-02-07 DIAGNOSIS — I10 Essential (primary) hypertension: Secondary | ICD-10-CM | POA: Diagnosis not present

## 2017-02-07 DIAGNOSIS — E2839 Other primary ovarian failure: Secondary | ICD-10-CM

## 2017-02-07 DIAGNOSIS — M1990 Unspecified osteoarthritis, unspecified site: Secondary | ICD-10-CM

## 2017-02-07 MED ORDER — EXEMESTANE 25 MG PO TABS: 25.0000 mg | ORAL_TABLET | Freq: Every day | ORAL | 3 refills | Status: DC

## 2017-02-08 ENCOUNTER — Ambulatory Visit
Admission: RE | Admit: 2017-02-08 | Discharge: 2017-02-08 | Disposition: A | Discharge status: Home / Self Care | Payer: Medicare Other | Source: Ambulatory Visit | Attending: Radiation Oncology | Admitting: Radiation Oncology

## 2017-02-08 DIAGNOSIS — C50412 Malignant neoplasm of upper-outer quadrant of left female breast: Secondary | ICD-10-CM

## 2017-02-08 DIAGNOSIS — Z170 Estrogen receptor positive status [ER+]: Secondary | ICD-10-CM | POA: Diagnosis not present

## 2017-02-08 DIAGNOSIS — Z803 Family history of malignant neoplasm of breast: Secondary | ICD-10-CM | POA: Diagnosis not present

## 2017-02-08 DIAGNOSIS — C50911 Malignant neoplasm of unspecified site of right female breast: Secondary | ICD-10-CM | POA: Diagnosis not present

## 2017-02-08 DIAGNOSIS — C50211 Malignant neoplasm of upper-inner quadrant of right female breast: Secondary | ICD-10-CM | POA: Diagnosis not present

## 2017-02-08 DIAGNOSIS — Z510 Encounter for antineoplastic radiation therapy: Secondary | ICD-10-CM | POA: Diagnosis not present

## 2017-02-08 MED ORDER — RADIAPLEXRX EX GEL
Freq: Once | CUTANEOUS | Status: AC
  Administered 2017-02-08: 14:00:00 via TOPICAL

## 2017-02-09 ENCOUNTER — Ambulatory Visit
Admission: RE | Admit: 2017-02-09 | Discharge: 2017-02-09 | Disposition: A | Discharge status: Home / Self Care | Payer: Medicare Other | Source: Ambulatory Visit | Attending: Radiation Oncology | Admitting: Radiation Oncology

## 2017-02-09 DIAGNOSIS — Z510 Encounter for antineoplastic radiation therapy: Secondary | ICD-10-CM | POA: Diagnosis not present

## 2017-02-09 DIAGNOSIS — C50911 Malignant neoplasm of unspecified site of right female breast: Secondary | ICD-10-CM | POA: Diagnosis not present

## 2017-02-09 NOTE — Progress Notes (Signed)
  Radiation Oncology         [phone] ________________________________  Name: [name] MRN: [id number]  Date: 01/07/2017  DOB: 18-May-1940  Optical Surface Tracking Plan:  Since intensity modulated radiotherapy (IMRT) and 3D conformal radiation treatment methods are predicated on accurate and precise positioning for treatment, intrafraction motion monitoring is medically necessary to ensure accurate and safe treatment delivery.  The ability to quantify intrafraction motion without excessive ionizing radiation dose can only be performed with optical surface tracking. Accordingly, surface imaging offers the opportunity to obtain 3D measurements of patient position throughout IMRT and 3D treatments without excessive radiation exposure.  I am ordering optical surface tracking for this patient's upcoming course of radiotherapy. ________________________________  Kyung Rudd, MD 02/09/2017 12:01 PM    Reference:   Ursula Alert, J, et al. Surface imaging-based analysis of intrafraction motion for breast radiotherapy patients.Journal of [place], n. 6, nov. 2014. ISSN [id number].   Available at: <http://www.jacmp.org/index.php/jacmp/article/view/4957>.

## 2017-02-09 NOTE — Progress Notes (Signed)
  Radiation Oncology         [phone] ________________________________  Name: [name] MRN: [id number]  Date: 01/07/2017  DOB: June 02, 1940  SIMULATION AND TREATMENT PLANNING NOTE  DIAGNOSIS:     ICD-10-CM   1. Malignant 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, estrogen receptor positive ([place]) C50.211    Z17.0   2. Malignant neoplasm of upper-outer quadrant of left breast in female, estrogen receptor positive ([place]) C50.412    Z17.0      Site:  Bilateral breasts  NARRATIVE:  The patient was brought to the [facility].  Identity was confirmed.  All relevant records and images related to the planned course of therapy were reviewed.   Written consent to proceed with treatment was confirmed which was freely given after reviewing the details related to the planned course of therapy had been reviewed with the patient.  Then, the patient was set-up in a stable reproducible  supine position for radiation therapy.  CT images were obtained.  Surface markings were placed.    Medically necessary complex treatment device(s) for immobilization:  Vac-lock bag.   The CT images were loaded into the planning software.  Then the target and avoidance structures were contoured.  Treatment planning then occurred.  The radiation prescription was entered and confirmed.  A total of 4 complex treatment devices were fabricated which relate to the designed radiation treatment fields. A reduced field technique will also be used to improve homogeneity of the plan.  Each of these customized fields/ complex treatment devices will be used on a daily basis during the radiation course. I have requested : 3D Simulation  I have requested a DVH of the following structures: target, lungs, heart.   PLAN:  The patient will receive 42.5 Gy in 17 fractions initially.  The patient will then receive a 7.5 Gy boost to each lumpectomy cavity.  The total dose delivered to each breast will therefore  be 50 Gy.  ________________________________   [name], MD, PhD

## 2017-02-10 ENCOUNTER — Ambulatory Visit
Admission: RE | Admit: 2017-02-10 | Discharge: 2017-02-10 | Disposition: A | Discharge status: Home / Self Care | Payer: Medicare Other | Source: Ambulatory Visit | Attending: Radiation Oncology | Admitting: Radiation Oncology

## 2017-02-10 DIAGNOSIS — Z510 Encounter for antineoplastic radiation therapy: Secondary | ICD-10-CM | POA: Diagnosis not present

## 2017-02-10 DIAGNOSIS — C50911 Malignant neoplasm of unspecified site of right female breast: Secondary | ICD-10-CM | POA: Diagnosis not present

## 2017-02-11 ENCOUNTER — Ambulatory Visit
Admission: RE | Admit: 2017-02-11 | Discharge: 2017-02-11 | Disposition: A | Discharge status: Home / Self Care | Payer: Medicare Other | Source: Ambulatory Visit | Attending: Radiation Oncology | Admitting: Radiation Oncology

## 2017-02-11 ENCOUNTER — Encounter: Payer: Self-pay | Admitting: *Deleted

## 2017-02-11 ENCOUNTER — Encounter: Payer: Self-pay | Admitting: Radiation Oncology

## 2017-02-11 DIAGNOSIS — C50211 Malignant neoplasm of upper-inner quadrant of right female breast: Secondary | ICD-10-CM | POA: Diagnosis not present

## 2017-02-11 DIAGNOSIS — C50412 Malignant neoplasm of upper-outer quadrant of left female breast: Secondary | ICD-10-CM | POA: Diagnosis not present

## 2017-02-11 DIAGNOSIS — Z510 Encounter for antineoplastic radiation therapy: Secondary | ICD-10-CM | POA: Diagnosis not present

## 2017-02-11 DIAGNOSIS — C50911 Malignant neoplasm of unspecified site of right female breast: Secondary | ICD-10-CM | POA: Diagnosis not present

## 2017-02-12 ENCOUNTER — Telehealth: Payer: Self-pay | Admitting: Hematology

## 2017-02-12 NOTE — Telephone Encounter (Signed)
Scheduled appt per 11/12 and sch emssage 11/16 - sent reminder letter in the mail with appt date and time.

## 2017-02-14 ENCOUNTER — Ambulatory Visit (INDEPENDENT_AMBULATORY_CARE_PROVIDER_SITE_OTHER): Payer: Medicare Other | Admitting: Orthopedic Surgery

## 2017-02-14 ENCOUNTER — Encounter (INDEPENDENT_AMBULATORY_CARE_PROVIDER_SITE_OTHER): Payer: Self-pay | Admitting: Orthopedic Surgery

## 2017-02-14 VITALS — Ht 66.0 in | Wt 180.0 lb

## 2017-02-14 DIAGNOSIS — S86212S Strain of muscle(s) and tendon(s) of anterior muscle group at lower leg level, left leg, sequela: Secondary | ICD-10-CM

## 2017-02-14 DIAGNOSIS — I2510 Atherosclerotic heart disease of native coronary artery without angina pectoris: Secondary | ICD-10-CM

## 2017-02-14 DIAGNOSIS — Z9861 Coronary angioplasty status: Secondary | ICD-10-CM | POA: Diagnosis not present

## 2017-02-14 NOTE — Progress Notes (Signed)
Office Visit Note   Patient: [name]           Date of Birth: 08-31-1940           MRN: [id number] Visit Date: 02/14/2017              Requested by: [address], [place], [place] [address] PCP: Redmond School, MD  Chief Complaint  Patient presents with  . Left Foot - Follow-up    Uric acid 7.9 11/11/16      HPI: Patient is a 76 year old woman who presents in follow-up for traumatic rupture anterior tibial tendon on the left.  Patient is status post excision of the left great toenail secondary to paronychial infection she also has completed 1 month radiation therapy for her breast cancer.  She has taken allopurinol once a day her previous uric acid was 7.9.  Assessment & Plan: Visit Diagnoses:  1. 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  Patient states that she does not want to use a brace.  Will follow up for the left great toenail she may need a further ablation.  Follow-Up Instructions: Return in about 3 months (around 05/17/2017).   Ortho Exam  Patient is alert, oriented, no adenopathy, well-dressed, normal affect, normal respiratory effort. Examination patient has foot drop on the left.  She has no plantar ulcers or calluses.  She has some mild tenderness to palpation across the forefoot secondary to her foot drop.  The left great toenail excision shows no signs of infection there is some residual nail over the medial border.  Imaging: No results found. No images are attached to the encounter.  Labs: Lab Results  Component Value Date   HGBA1C 5.7 (H) 12/07/2016   HGBA1C 5.8 (H) 05/06/2016   HGBA1C 6.0 (H) 11/17/2015   LABURIC 7.9 (H) 11/11/2016   REPTSTATUS 11/18/2015 FINAL 11/17/2015   CULT (A) 11/17/2015    50,000 COLONIES/mL LACTOBACILLUS SPECIES Standardized susceptibility testing for this organism is not available.    LABORGA ESCHERICHIA  COLI 12/31/2009    Orders:  No orders of the defined types were placed in this encounter.  No orders of the defined types were placed in this encounter.    Procedures: No procedures performed  Clinical Data: No additional findings.  ROS:  All other systems negative, except as noted in the HPI. Review of Systems  Objective: Vital Signs: Ht '5\' 6"'$  (1.676 m)   Wt 180 lb (81.6 kg)   BMI 29.05 kg/m   Specialty Comments:  No specialty comments available.  PMFS History: Patient Active Problem List   Diagnosis Date Noted  . Genetic testing 12/03/2016  . 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 Malignant neoplasm of upper-outer quadrant of left breast in female, estrogen receptor positive ([place]) 11/19/2016  . Chronic idiopathic gout involving toe of left foot without tophus 11/15/2016  . Malignant 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, estrogen receptor positive ([place]) 11/09/2016  . Tendon tear, ankle, left, sequela   . Traumatic rupture of left anterior tibial tendon 04/20/2016  . Acute on chronic kidney failure ([place])   . 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 Headache, migraine   . Unexplained weight loss 11/17/2015  . Acute kidney injury superimposed on chronic kidney disease ([place]) 11/17/2015  . Restless legs syndrome 07/24/2015  . Mild cognitive impairment 01/08/2015  .  Abnormality of gait 01/08/2015  . Dizziness 12/19/2014  . Preoperative cardiovascular examination 12/11/2013  . GERD (gastroesophageal reflux disease) 10/06/2013  . Syncope 10/05/2013  . Dementia 12/06/2012  . Chest pain at rest 10/28/2012  . Anxiety 10/28/2012    Class: Acute  . Edema of both legs 10/28/2012  . CAD S/P percutaneous coronary angioplasty   . Essential hypertension   . Dyslipidemia, goal LDL below 70    Past Medical History:  Diagnosis Date  . Anxiety   . Arthritis     osteoarthritis  . Breast cancer in female [facility])    Bilateral  .  CAD S/P percutaneous coronary angioplasty 2007; March 2011   Liberte' EMS 3.0 mm 20 mm postdilated 3.6 mm in early mid LAD; status post ISR Cutting Balloon PTCA and March 11 along with PCI of distal mid lesion with a 3.0 mm 12 mm MultiLink vision BMS; the proximal stent causes jailing of SP1 and SP2 with ostial 70-80% lesions  . Cancer (HCC)    Melanoma, Squamous cell Carcinoma  . CHF (congestive heart failure) ([place])   . Chronic kidney disease    stage 2   . Dementia    mild  . Diabetes mellitus type 2 with neurological manifestations ([place])   . Dyslipidemia, goal LDL below 70   . Full dentures   . Genetic testing 12/03/2016   Germline genetic testing was performed through [facility]'s Common Hereditary Cancers Panel + [facility]'s Melanoma Panel. This custom panel includes analysis of the following 51 genes: APC, ATM, AXIN2, BAP1, BARD1, BMPR1A, BRCA1, BRCA2, BRIP1, CDH1, CDK4, CDKN2A, CHEK2, CTNNA1, DICER1, EPCAM, GREM1, HOXB13, KIT, MEN1, MITF, MLH1, MSH2, MSH3, MSH6, MUTYH, NBN, NF1, NTHL1, PALB2, PDGFRA, PMS2, POLD1, POL  . GERD (gastroesophageal reflux disease)   . Headache(784.0)   . History of hematuria    Followed by Dr. Gaynelle Arabian  . Hypertension, essential, benign   . Nausea & vomiting 10/2015  . Peripheral neuropathy   . Stroke ([place])   . TIA (transient ischemic attack)    multiple in the past  . Wears glasses     Family History  Problem Relation Age of Onset  . Diabetes Mother   . Hypertension Mother   . Kidney disease Mother   . Hypertension Father   . Emphysema Father   . Heart attack Father   . Heart disease Father   . Arthritis Sister   . Diabetes Sister   . Hypertension Sister   . Breast cancer Sister 108       treated with mastectomy  . Cervical cancer Sister 27  . Hypertension Brother   . Hyperlipidemia Brother   . Diabetes Brother   . Stroke Brother   . Diabetes Sister   . Hypertension Sister   . Stomach cancer Sister 89  . Hypertension Brother   . ALS  Brother        d.62s  . Breast cancer Daughter 83       triple negative treated with lumpectomy, chemo, radiation  . Heart attack Daughter 64       d.45  . COPD Daughter   . Cancer Paternal Aunt        unspecified type  . Cancer Paternal Uncle        unspecified type  . Cancer Paternal Uncle        unspecified type    Past Surgical History:  Procedure Laterality Date  . ABDOMINAL HYSTERECTOMY    . APPENDECTOMY    . BILATERAL  BREAST LUMPECTOMY WITH BILATERAL  RADIOACTIVE SEED AND BILATERAL AXILLARY  SENTINEL LYMPH NODE BIOPSY ERAS PATHWAY Bilateral 12/14/2016   Performed by Alphonsa Overall, MD at [facility]  . BLADDER SUSPENSION    . CARDIAC CATHETERIZATION  02/24/2006   85% stenosis in the proximal portion of LAD-arrangements made for PCI on 02/25/2006  . CARDIAC CATHETERIZATION  02/25/2006   80% LAD lesion stented with a 3x39m Liberte stent resulting in reduction of 80% lesion to 0% residual  . CARDIAC CATHETERIZATION  04/26/2006   Medical management  . CARDIAC CATHETERIZATION  06/24/2009   60-70% re-stenosis in the proximal LAD. A 3.25x15 cutting balloon, 3 inflations - 14atm-38sec, 13atm-39sec, and 12atm-40sec reduced to less than 10%. 60% stenosis of the mid/distal LAD stented with a 3x125mMultilink stent.  . Marland KitchenARDIAC CATHETERIZATION  07/09/2009   Medical management  . CAROTID DOPPLER  06/24/2009   40-59% R ICA stenosis. No significant ICA stenosis noted  . CHOLECYSTECTOMY    . DILATION AND CURETTAGE OF UTERUS    . EXCISION LEFT LOWER LEG LESION Left 06/10/2014   [facility]. EXCISION OF LEFT FOREARM  MASS Left 09/05/2015   [facility]. EXCISION OF RIGHT PRE TIBIAL LESION Right 03/11/2015   [facility]. JOINT REPLACEMENT Left   . Left Anterior Tibial Tendon Reconstruction Left 05/12/2016   [facility]formed [facility] DuNewt Min[facility]MD [facility] [place]  MYOVIEW LTD  11/16/2011   5 beats a PVC during recovery.  No skin or infarction.  Reached 5 METs, EKG negative for ischemia   . SHOULDER ARTHROSCOPY  10/15  . SHOULDER ARTHROSCOPY  1995   left  . TOTAL KNEE ARTHROPLASTY  2005   left  . TRANSTHORACIC ECHOCARDIOGRAM  10/2015   EF 55-60 %. Mild LVH. Mild pulmonary hypertension. Normal valves.  . TRANSTHORACIC ECHOCARDIOGRAM  12/31/2009/July 2015   IsLV size & function, Gr 1 DD w/ mild TR.;; b)09/2013: 55-60%. Grade 1 DD, mild aortic sclerosis  . WRIST ARTHROPLASTY  2010   cancer lt wrist   Social History   Occupational History  . Occupation: retired    EmFish farm managerRETIRED  Tobacco Use  . Smoking status: Never Smoker  . Smokeless tobacco: Never Used  Substance and Sexual Activity  . Alcohol use: No  . Drug use: No  . Sexual activity: No

## 2017-02-15 NOTE — Progress Notes (Signed)
  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     Indication for treatment:  Curative       Radiation treatment dates:   01/17/2017 to 02/11/2017  Site/dose:    1. The Left breast was treated to 42.5 Gy in 17 fractions of 2.5 Gy. 2. The Right breast was treated to 42.5 Gy in 17 fractions of 2.5 Gy. 3. The Right breast was boosted to 7.5 Gy in 3 fractions of 2.5 Gy. 4. The Left breast was boosted to 7.5 Gy in 3 fractions of 2.5 Gy.  Beams/energy:    1. 3D // 10X, 6X 2. 3D // 10X, 6X 3. En face electron // 15E 4. En face electron // 12E, 9E  Narrative: The patient tolerated radiation treatment relatively well.   She developed erythema and hyperpigmentation in the treatment area without moist desquamation. She had no major concerns or complaints with treatment. Continuing to use skin cream as directed.   Plan: The patient has completed radiation treatment. The patient will return to radiation oncology clinic for routine followup in one month. I advised them to call or return sooner if they have any questions or concerns related to their recovery or treatment.  ------------------------------------------------  [name], MD, PhD  This document serves as a record of services personally performed by Kyung Rudd, MD. It was created on his behalf by Arlyce Harman, a trained medical scribe. The creation of this record is based on the scribe's personal observations and the provider's statements to them. This document has been checked and approved by the attending provider.

## 2017-02-22 ENCOUNTER — Telehealth: Payer: Self-pay | Admitting: *Deleted

## 2017-02-22 NOTE — Telephone Encounter (Signed)
"  [place] for my wife.  She was given aromasin with no exact start date.  Should labs be drawn before starting this medicine instead of January?  Who is Ebony Hail she is to see Christmas Eve?  She does not see Dr, Burr Medico until January."  Reviewed 02-07-2017 plan.  Provided Central Scheduling Radiology number to schedule Bone Density.   Call transferred for further clarification of bone density and Aromasine start date per [facility] request.   Routing call information to collaborative nurse and provider for review.  Further patient communication through collaborative nurse.

## 2017-02-22 NOTE — Telephone Encounter (Signed)
Talked with pt's husband, Sanks & informed to get Bone Density 11/29 as ordered & scheduled & once results are seen, Dr Burr Medico will let her know to start aromasin.  Instructed to call next week if he hasn't heard anything.  [place] expressed understanding.

## 2017-02-24 ENCOUNTER — Ambulatory Visit
Admission: RE | Admit: 2017-02-24 | Discharge: 2017-02-24 | Disposition: A | Discharge status: Home / Self Care | Payer: Medicare Other | Source: Ambulatory Visit | Attending: Hematology | Admitting: Hematology

## 2017-02-24 DIAGNOSIS — Z780 Asymptomatic menopausal state: Secondary | ICD-10-CM | POA: Diagnosis not present

## 2017-02-24 DIAGNOSIS — E2839 Other primary ovarian failure: Secondary | ICD-10-CM

## 2017-02-24 DIAGNOSIS — M85852 Other specified disorders of bone density and structure, left thigh: Secondary | ICD-10-CM | POA: Diagnosis not present

## 2017-02-25 HISTORY — PX: TRANSTHORACIC ECHOCARDIOGRAM: SHX275

## 2017-02-25 IMAGING — CR DG CHEST 2V
2 series · 2 of 2 positions shown · non-contrast
Comparison: 06/07/2014 and prior chest radiographs

CLINICAL DATA: Preoperative respiratory examination for left
forearm surgery.

EXAM:
CHEST  2 VIEW

[w chest pa]
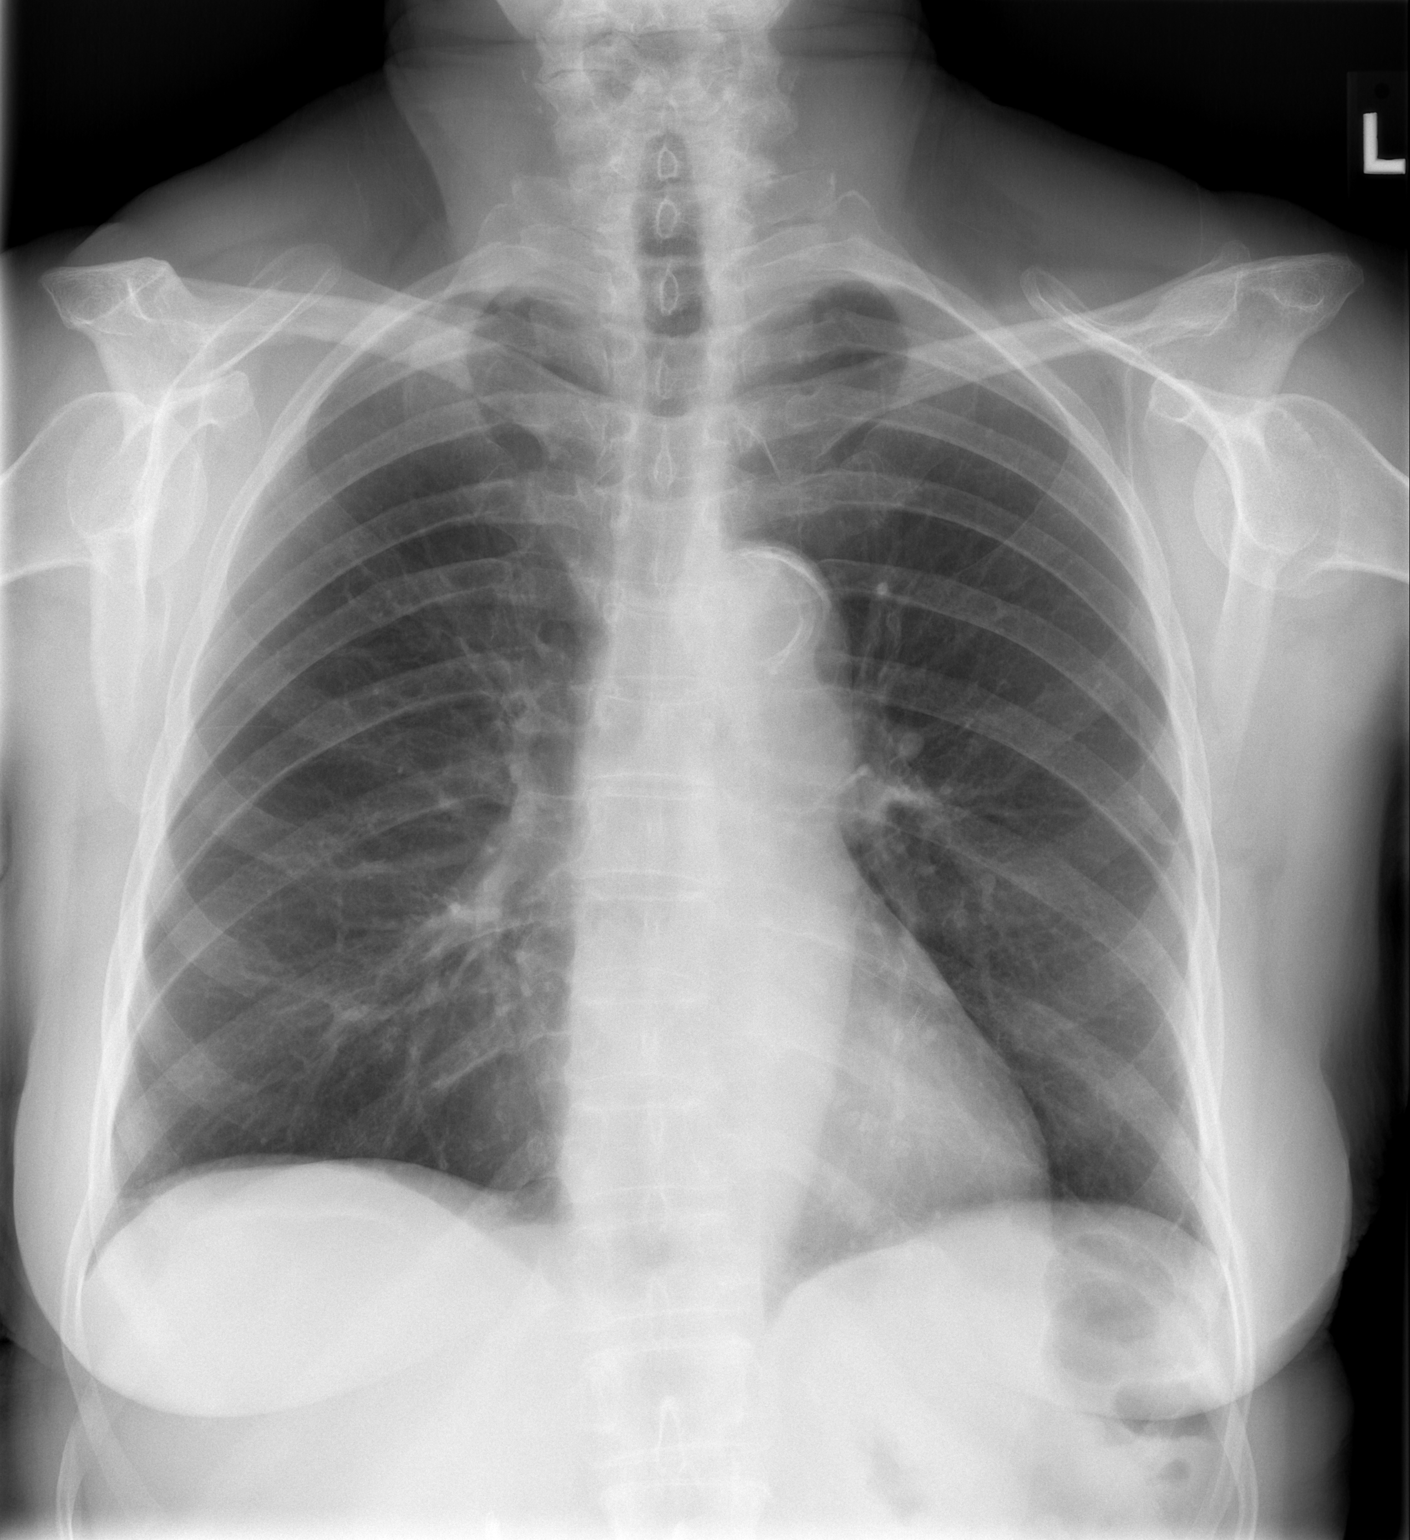

[w chest lat]
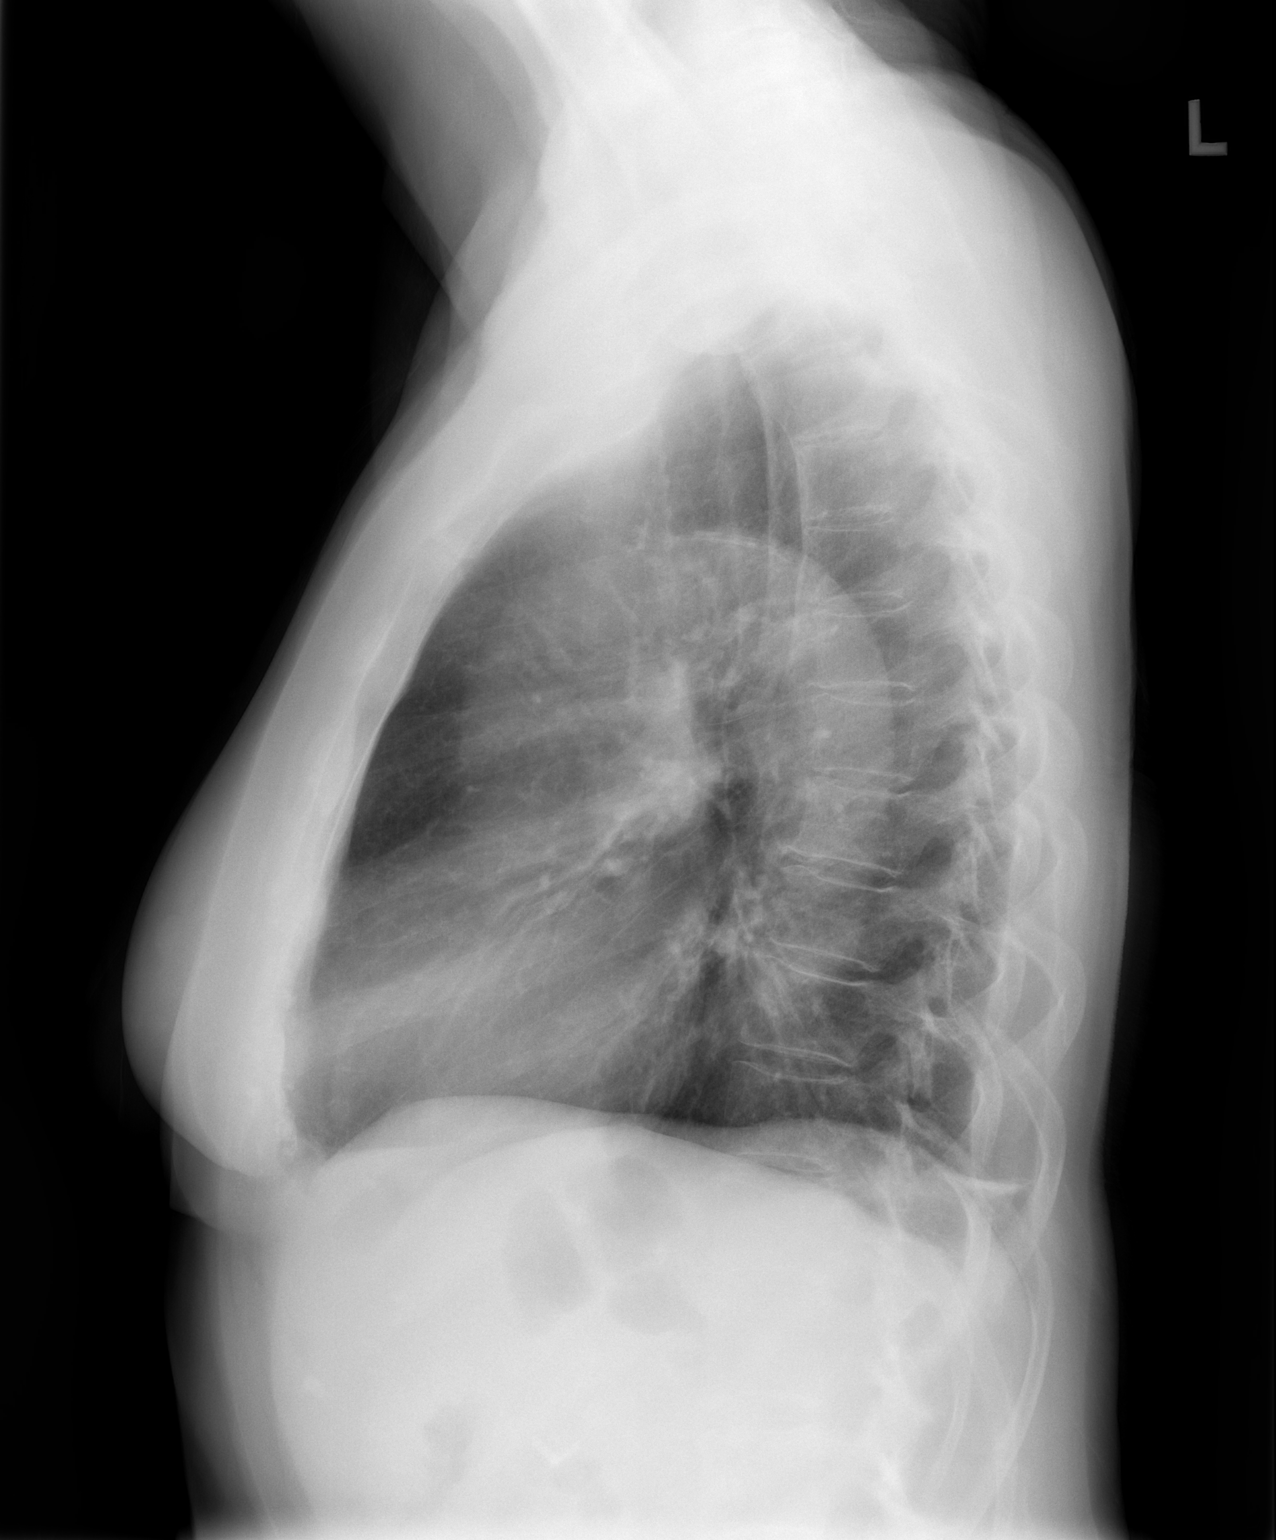

[2 of 2 positions shown; findings below may reference images not displayed]

FINDINGS: The cardiomediastinal silhouette is unremarkable.

Aortic atherosclerotic calcifications noted.

There is no evidence of focal airspace disease, pulmonary edema,
suspicious pulmonary nodule/mass, pleural effusion, or pneumothorax.
No acute bony abnormalities are identified.
IMPRESSION: No active cardiopulmonary disease.

## 2017-03-01 ENCOUNTER — Encounter (INDEPENDENT_AMBULATORY_CARE_PROVIDER_SITE_OTHER): Payer: Medicare Other | Admitting: Ophthalmology

## 2017-03-01 DIAGNOSIS — I10 Essential (primary) hypertension: Secondary | ICD-10-CM

## 2017-03-01 DIAGNOSIS — E11319 Type 2 diabetes mellitus with unspecified diabetic retinopathy without macular edema: Secondary | ICD-10-CM | POA: Diagnosis not present

## 2017-03-01 DIAGNOSIS — H35341 Macular cyst, hole, or pseudohole, right eye: Secondary | ICD-10-CM | POA: Diagnosis not present

## 2017-03-01 DIAGNOSIS — H43812 Vitreous degeneration, left eye: Secondary | ICD-10-CM | POA: Diagnosis not present

## 2017-03-01 DIAGNOSIS — E119 Type 2 diabetes mellitus without complications: Secondary | ICD-10-CM | POA: Diagnosis not present

## 2017-03-01 DIAGNOSIS — E113293 Type 2 diabetes mellitus with mild nonproliferative diabetic retinopathy without macular edema, bilateral: Secondary | ICD-10-CM

## 2017-03-01 DIAGNOSIS — H35033 Hypertensive retinopathy, bilateral: Secondary | ICD-10-CM

## 2017-03-03 ENCOUNTER — Encounter: Payer: Self-pay | Admitting: Neurology

## 2017-03-03 ENCOUNTER — Telehealth: Payer: Self-pay | Admitting: *Deleted

## 2017-03-03 ENCOUNTER — Ambulatory Visit (INDEPENDENT_AMBULATORY_CARE_PROVIDER_SITE_OTHER): Payer: Medicare Other | Admitting: Neurology

## 2017-03-03 VITALS — BP 141/91 | HR 73 | Wt 177.4 lb

## 2017-03-03 DIAGNOSIS — B0229 Other postherpetic nervous system involvement: Secondary | ICD-10-CM

## 2017-03-03 DIAGNOSIS — I2510 Atherosclerotic heart disease of native coronary artery without angina pectoris: Secondary | ICD-10-CM

## 2017-03-03 DIAGNOSIS — Z9861 Coronary angioplasty status: Secondary | ICD-10-CM

## 2017-03-03 MED ORDER — TOPIRAMATE 50 MG PO TABS: 25.0000 mg | ORAL_TABLET | Freq: Two times a day (BID) | ORAL | 3 refills | Status: DC

## 2017-03-03 NOTE — Telephone Encounter (Signed)
Received call from husband Angelize Ryce wanting to know when to start Aromasin.  Pt had bone density done on 02/24/17. [name] wanted to know if pt should have labs prior to starting Aromasin.  Pt has follow up appt with [name]  04/14/17. Pt's   Phone     [phone].

## 2017-03-03 NOTE — Progress Notes (Signed)
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  Update 12/06/2012 (PS): [name] returns for f/u after last vist 06/07/2012. She has stopped cerefolin nac as insurance is not paying for it. Memory loss seems stable but she feels subjectively she was doing better on Cerefolin nac. She recently fell of a tractor in her yard and hurt her foot but is improving. She continues to have poor recent memory but is able to recall later. She decided not to do expedition 3 study upon advise from [name] her cardiologist.   Update 01/10/2014 ; she returns for followup after last visit 6 months ago who. She is scheduled in about the same. She continues to have mild short-term memory difficulties unchanged. She has been taking several daily and finds it quite useful. She saw primary physician Dr. Gerarda Fraction who suggested she stop the Depakote. 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 She used to also have some light sensitivity and possibly migrainous phenomena which also seemed to be controlled on Depakote. She underwent right shoulder surgery 2 weeks ago which went well and she has no complaints. She was hospitalized briefly twice for syncopal episodes and she thought to be vasovagal related to a diarrhea episode and dehydration. Update 07/12/2014 : She returns for follow-up after last visit 6 months ago. He is accompanied by her husband . She states some memory difficulties unchanged. She reads a lot particularly on religion. She has a difficult time remembering the day and the date but has not used good compensatory strategies for this. She remains on Cerefolin which she likes. She  has noted increasing symptoms of restless legs and at times has to take 2 tablets on Mirapex which work well. She remains on Depakote as she feels it helps her headaches and vision difficulties. She states her blood pressure is normally quite high but she has some whitecoat hypertension blood pressure is elevated today at 140/100. UPDATE 04/27/2017CM Kristen. [name], 76 year old female returns for six-month follow-up. 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 She reports that she has good and bad days when she is not feeling her best she stays at home and keeps to herself. She continues to cook and drive and perform all activities of daily living. She reports that she has not gotten lost in familiar places. Her most recent hemoglobin A1c was 5.2 No further syncopal episodes since stopping Depakote . She remains on Cerefolin which she feels is beneficial for her neuropathy and her memory. She is also on Mirapex for restless legs.  She returns for reevaluation  UPDATE 01/22/16 CM Kristen. [name], 76 year old female returns for follow-up she has a history of mild cognitive impairment and memory score is stable today at 30/30. She remains on Cerefolin. She continues to cook and perform activities of daily living. She claims her blood sugars are in good control.She has restless leg syndrome which has gotten mildly worse in the left leg worse since she now has a stress fracture to her left foot and is in a boot. She had a fall at home. She has not had further syncopal episodes since stopping Depakote last year. She returns for reevaluation Update 11/23/2016 : She returns for  follow-up after last visit 10 months ago. She states her memory difficulties remain about unchanged however she's had an episode of shingles in February this year followed by severe postherpetic neuralgic pain which still persists. She has daily almost constant pain and she has sensitivity to touch beneath her right shoulder and upper  back. She has been taking gabapentin at night and during the day she takes oxycodone which does take the edge off but makes her sleepy and drowsy. She has not tried other medication like Topamax and Lyrica. She continues to take senna fallen and does participate in cognitively challenging activities and feels some memory difficulties are unchanged. On the Mini-Mental status exam today she scored 26/30 with deficits in orientation and attention. She was able to name 11 animals with 4 legs. Clock drawing was 4/4. Update 03/03/2017 : She returns for follow-up after last visit 3 months ago. She still has significant postherpetic neuralgic pain. She feels Topamax seems to have helped but she is only on 25 mg twice daily which is tolerating well without side effects. She continues to take gabapentin 300 and the morning and 6 and at night as well. She did tried Lyrica in the past which was helping but she developed significant leg swelling and had to discontinue it. She has subsequently had bilateral mastectomy for breast cancer on 9/18 518 and has finished 2 weeks out of a 4 week course of radiation treatment. She states her blood pressure is well controlled and today it is 141/91. She's been watching her diet and her sugars are well controlled on last hemoglobin A1c was 5.4. She states her mild memory difficulties appeared to be unchanged. She does participate in regular activities for cognitively challenging tasks like solving crossword puzzles sudoku. She also takes CerefolinNAC REVIEW OF SYSTEMS: Full 14 system review of systems performed and notable only for those listed, all others are neg:    Loss of vision and blurred vision, leg swelling, palpitations, frequent waking, daytime sleepiness, insomnia, restless legs, difficulty urinating, incontinence of bladder, decreased urine, joint and back pain, easy bruising, dizziness, headache, numbness, weakness, agitation, depression, nervousness and anxiety and all other  systems negative and all other systems negative  ALLERGIES: Allergies  Allergen Reactions  . Lipitor [Atorvastatin] Other (See Comments)    Bone and muscle pain  . Lyrica [Pregabalin]     Weight gain, extremity swelling  . Nsaids Swelling  . Reglan [Metoclopramide]     Tardive dyskinesia; paradoxical reaction not relieved by benadryl  . Ultram [Tramadol] Other (See Comments)    Pt has seizure activity with this medication    HOME MEDICATIONS: Outpatient Medications Prior to Visit  Medication Sig Dispense Refill  . allopurinol (ZYLOPRIM) 100 MG tablet Take 1 tablet (100 mg total) by mouth daily. 30 tablet 6  . Biotin w/ Vitamins C & E (HAIR SKIN & NAILS GUMMIES PO) Take 2 tablets by mouth daily.     . Biotin w/ Vitamins C & E (HAIR/SKIN/NAILS PO) Take by mouth.    . bumetanide (BUMEX) 1 MG tablet Take 2 mg by mouth daily.     . busPIRone (BUSPAR) 7.5 MG tablet Take 7.5 mg by mouth 2 (two) times daily.    . Calcium Carb-Cholecalciferol (CALCIUM 600 + D PO) Take 1 tablet by mouth at bedtime.     . Cholecalciferol 2000 units CAPS Take 2,000 Units by mouth daily.    . clopidogrel (PLAVIX) 75 MG tablet Take 1 tablet (75 mg total) by mouth  daily. 90 tablet 3  . COD LIVER OIL PO Take 415 mg by mouth daily.    . colchicine 0.6 MG tablet Take one tablet daily during acute gout attack for 3 days, discontinue after this. Resume in the event of another acute gout flare up. 30 tablet 5  . CRANBERRY PO Take 1 capsule by mouth daily.    . diphenhydrAMINE (BENADRYL) 50 MG capsule Take 50 mg by mouth at bedtime.    . Fish Oil-Krill Oil (KRILL OIL PLUS PO) Take 1 capsule by mouth daily.     Marland Kitchen gabapentin (NEURONTIN) 300 MG capsule Take 600 mg by mouth at bedtime.     Marland Kitchen HYDROcodone-acetaminophen (NORCO/VICODIN) 5-325 MG tablet Take 1-2 tablets by mouth every 6 (six) hours as needed for moderate pain. 20 tablet 0  . Iron-FA-B Cmp-C-Biot-Probiotic (FUSION PLUS) CAPS takes 1 tablet daily  9  . losartan  (COZAAR) 100 MG tablet take 1 tablet daily  3  . Melatonin 5 MG CAPS Take 5 mg by mouth at bedtime.    . Methylfol-Methylcob-Acetylcyst (CEREFOLIN NAC) 6-2-600 MG TABS TAKE 1 CAPLET BY MOUTH ONCE DAILY. 90 each 1  . metoprolol succinate (TOPROL XL) 50 MG 24 hr tablet Take 0.5 tablet (25 mg total) by mouth daily. 45 tablet 3  . nitroGLYCERIN (NITROLINGUAL) 0.4 MG/SPRAY spray Place 1 spray under the tongue every 5 (five) minutes x 3 doses as needed for chest pain.     . non-metallic deodorant Jethro Poling) MISC Apply 1 application topically daily as needed.    . pantoprazole (PROTONIX) 40 MG tablet TAKE 1 TABLET DAILY (CONTACT OFFICE FOR ADDITIONAL REFILLS, FINAL ATTEMPT) 90 tablet 0  . pramipexole (MIRAPEX) 0.25 MG tablet TAKE 1 TABLET AT 4 P.M. AND 2 TABLETS (0.5 MG) AT BEDTIME 270 tablet 0  . Resveratrol (RESERVAPAK PO) Take 1 tablet by mouth every evening.     . rosuvastatin (CRESTOR) 20 MG tablet Take 20 mg by mouth at bedtime.    . Wound Cleansers (RADIAPLEX EX) Apply topically.    . topiramate (TOPAMAX) 25 MG tablet Take 1 tablet (25 mg total) by mouth 2 (two) times daily. 180 tablet 1  . exemestane (AROMASIN) 25 MG tablet Take 1 tablet (25 mg total) daily after breakfast by mouth. (Patient not taking: Reported on 03/03/2017) 90 tablet 3  . LORazepam (ATIVAN) 0.5 MG tablet Take 0.5 mg every 8 (eight) hours as needed by mouth.      No facility-administered medications prior to visit.     PAST MEDICAL HISTORY: Past Medical History:  Diagnosis Date  . Anxiety   . Arthritis     osteoarthritis  . Breast cancer in female [facility])    Bilateral  . CAD S/P percutaneous coronary angioplasty 2007; March 2011   [facility] 3.0 mm 20 mm postdilated 3.6 mm in early mid LAD; status post ISR Cutting Balloon PTCA and March 11 along with PCI of distal mid lesion with a 3.0 mm 12 mm MultiLink vision BMS; the proximal stent causes jailing of SP1 and SP2 with ostial 70-80% lesions  . Cancer (HCC)    Melanoma,  Squamous cell Carcinoma  . CHF (congestive heart failure) ([place])   . Chronic kidney disease    stage 2   . Dementia    mild  . Diabetes mellitus type 2 with neurological manifestations ([place])   . Dyslipidemia, goal LDL below 70   . Full dentures   . Genetic testing 12/03/2016   Germline genetic testing was performed  through [facility] Common Hereditary Cancers Panel + [facility]'s Melanoma Panel. This custom panel includes analysis of the following 51 genes: APC, ATM, AXIN2, BAP1, BARD1, BMPR1A, BRCA1, BRCA2, BRIP1, CDH1, CDK4, CDKN2A, CHEK2, CTNNA1, DICER1, EPCAM, GREM1, HOXB13, KIT, MEN1, MITF, MLH1, MSH2, MSH3, MSH6, MUTYH, NBN, NF1, NTHL1, PALB2, PDGFRA, PMS2, POLD1, POL  . GERD (gastroesophageal reflux disease)   . Headache(784.0)   . History of hematuria    Followed by Dr. Gaynelle Arabian  . Hypertension, essential, benign   . Nausea & vomiting 10/2015  . Peripheral neuropathy   . Stroke ([place])   . TIA (transient ischemic attack)    multiple in the past  . Wears glasses     PAST SURGICAL HISTORY: Past Surgical History:  Procedure Laterality Date  . ABDOMINAL HYSTERECTOMY    . APPENDECTOMY    . BLADDER SUSPENSION    . BREAST LUMPECTOMY WITH RADIOACTIVE SEED AND SENTINEL LYMPH NODE BIOPSY Bilateral 12/14/2016   Procedure: BILATERAL BREAST LUMPECTOMY WITH BILATERAL  RADIOACTIVE SEED AND BILATERAL AXILLARY  SENTINEL LYMPH NODE BIOPSY ERAS PATHWAY;  Surgeon: Alphonsa Overall, MD;  Location: [facility];  Service: General;  Laterality: Bilateral;  PECTORAL BLOCK  . CARDIAC CATHETERIZATION  02/24/2006   85% stenosis in the proximal portion of LAD-arrangements made for PCI on 02/25/2006  . CARDIAC CATHETERIZATION  02/25/2006   80% LAD lesion stented with a 3x7m [employer] stent resulting in reduction of 80% lesion to 0% residual  . CARDIAC CATHETERIZATION  04/26/2006   Medical management  . CARDIAC CATHETERIZATION  06/24/2009   60-70% re-stenosis in the proximal LAD. A 3.25x15 cutting balloon, 3 inflations  - 14atm-38sec, 13atm-39sec, and 12atm-40sec reduced to less than 10%. 60% stenosis of the mid/distal LAD stented with a 3x153mMultilink stent.  . Marland KitchenARDIAC CATHETERIZATION  07/09/2009   Medical management  . CAROTID DOPPLER  06/24/2009   40-59% R ICA stenosis. No significant ICA stenosis noted  . CHOLECYSTECTOMY    . DILATION AND CURETTAGE OF UTERUS    . JOINT REPLACEMENT Left   . LESION REMOVAL Right 03/11/2015   Procedure:  EXCISION OF RIGHT PRE TIBIAL LESION;  Surgeon: JaJudeth HornMD;  [place] [facility] Service: General;  Laterality: Right;  . MASS EXCISION Left 06/10/2014   Procedure: EXCISION LEFT LOWER LEG LESION;  Surgeon: JaDoreen SalvageMD;  [place] [facility]: [facility];  Laterality: Left;  . Marland KitchenASS EXCISION Left 09/05/2015   Procedure: EXCISION OF LEFT FOREARM  MASS;  Surgeon: JaJudeth HornMD;  [facility] Service: General;  Laterality: Left;  . NM MYOVIEW LTD  11/16/2011   5 beats a PVC during recovery.  No skin or infarction.  Reached 5 METs, EKG negative for ischemia   . SHOULDER ARTHROSCOPY  10/15  . SHOULDER ARTHROSCOPY  1995   left  . TENDON REPAIR Left 05/12/2016   Procedure: Left Anterior Tibial Tendon Reconstruction;  Surgeon: MaNewt MinionMD;  [facility] Service: Orthopedics;  Laterality: Left;  . TOTAL KNEE ARTHROPLASTY  2005   left  . TRANSTHORACIC ECHOCARDIOGRAM  10/2015   EF 55-60 %. Mild LVH. Mild pulmonary hypertension. Normal valves.  . TRANSTHORACIC ECHOCARDIOGRAM  12/31/2009/July 2015   IsLV size & function, Gr 1 DD w/ mild TR.;; b)09/2013: 55-60%. Grade 1 DD, mild aortic sclerosis  . WRIST ARTHROPLASTY  2010   cancer lt wrist    FAMILY HISTORY: Family History  Problem Relation Age of Onset  . Diabetes Mother   .  Hypertension Mother   . Kidney disease Mother   . Hypertension Father   . Emphysema Father   . Heart attack Father   . Heart disease Father   . Arthritis Sister   .  Diabetes Sister   . Hypertension Sister   . Breast cancer Sister 47       treated with mastectomy  . Cervical cancer Sister 68  . Hypertension Brother   . Hyperlipidemia Brother   . Diabetes Brother   . Stroke Brother   . Diabetes Sister   . Hypertension Sister   . Stomach cancer Sister 31  . Hypertension Brother   . ALS Brother        d.62s  . Breast cancer Daughter 65       triple negative treated with lumpectomy, chemo, radiation  . Heart attack Daughter 45       d.45  . COPD Daughter   . Cancer Paternal Aunt        unspecified type  . Cancer Paternal Uncle        unspecified type  . Cancer Paternal Uncle        unspecified type    SOCIAL HISTORY: Social History   Socioeconomic History  . Marital status: Married    Spouse name: [name]  . Number of children: 3  . Years of education: 35  . Highest education level: Not on file  Social Needs  . Financial resource strain: Not on file  . Food insecurity - worry: Not on file  . Food insecurity - inability: Not on file  . Transportation needs - medical: Not on file  . Transportation needs - non-medical: Not on file  Occupational History  . Occupation: retired    Fish farm manager: RETIRED  Tobacco Use  . Smoking status: Never Smoker  . Smokeless tobacco: Never Used  Substance and Sexual Activity  . Alcohol use: No  . Drug use: No  . Sexual activity: No  Other Topics Concern  . Not on file  Social History Narrative   She is a married mother of 27, grandmother of 2.  Usually very active and out about the house.  But currently over last 6-8 weeks has been incapacitated.  She never smoked and does not drink alcohol.     PHYSICAL EXAM  Vitals:   03/03/17 0840  BP: (!) 141/91  Pulse: 73  Weight: 177 lb 6.4 oz (80.5 kg)   Body mass index is 28.63 kg/m. General: Frail elderly Caucasian seated, in no evident distress  Head: head normocephalic and atraumatic.   Neck: supple with no carotid  bruits  Cardiovascular:  regular rate and rhythm, no murmurs  Musculoskeletal: no deformity  e Skin: no rash/petichiae   Neurologic Exam  Mental Status: Awake and fully alert. Oriented to place and time. Recent and remote memory intact. Attention span, concentration and fund of knowledge appropriate. Mood and affect appropriate.  MMSE 26/30 with deficits in orientation and attention. Animal Naming 11. Clock drawing 4/4.  Cranial Nerves: Pupils equal, briskly reactive to light. Extraocular movements full without nystagmus. Visual fields full to confrontation. Hearing intact. Facial sensation intact. Face, tongue, palate moves normally and symmetrically.  Motor: Normal bulk and tone. Normal strength in all tested extremity muscles. Mild head tremor at rest.  Sensory: intact to touch , decreased pinprick to midshin decreased vibratory to ankles.  Coordination: Rapid alternating movements normal in all extremities. Finger-to-nose and heel-to-shin performed accurately bilaterally.  Gait and Station: Arises from chair without  difficulty. Stance is wide based.. Gait demonstrates normal stride length and balance . Unable to heel, toe or  tandem walk .No assistive device left lower extremity boot in place for stress fracture Reflexes: 1+ and symmetric.  DIAGNOSTIC DATA (LABS, IMAGING, TESTING) - I reviewed patient records, labs, notes, testing and imaging myself where available.  Lab Results  Component Value Date   WBC 6.4 12/07/2016   HGB 12.6 12/07/2016   HCT 38.5 12/07/2016   MCV 91.9 12/07/2016   PLT 207 12/07/2016      Component Value Date/Time   NA 139 12/07/2016 0904   K 4.9 12/07/2016 0904   CL 105 12/07/2016 0904   CO2 25 12/07/2016 0904   GLUCOSE 119 (H) 12/07/2016 0904   BUN 23 (H) 12/07/2016 0904   CREATININE 1.51 (H) 12/07/2016 0904   CREATININE 0.94 01/09/2013 0839   CALCIUM 10.0 12/07/2016 0904   PROT 7.1 11/13/2016 1929   ALBUMIN 3.8 11/13/2016 1929   AST 25 11/13/2016 1929   ALT 19  11/13/2016 1929   ALKPHOS 68 11/13/2016 1929   BILITOT 0.3 11/13/2016 1929   GFRNONAA 32 (L) 12/07/2016 0904   GFRAA 37 (L) 12/07/2016 0904     ASSESSMENT AND PLANI  76 y.o. 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 Likely restless leg syndrome, Peripheral neuropathy (HCC)  Chronic kidney disease; Headache(784.0); gait abnormality . Postherpetic neuralgia following shingles in February 2018 PLAN:  I had a long discussion with the patient and husband regarding her post herpetic neuralgic pain which appears to have shown partial response to Topamax. I recommend increasing the dose to 25 mg in the morning and 50 mg at night for 1 week and then to 50 mg twice daily as tolerated. Continue gabapentin at the present dose. She was also advised to continue   participation in  cognitively challenging activities like solving crossword puzzles, playing sudoku and bridge.  She returns for follow-up in 6 months with nurse practitioner call earlier if necessary.Greater than 50% time during this 25 minute visit was spent on counseling and coordination of care about her postherpetic neuralgic pain mild cognitive impairment and answering questions Garvin Fila, MD [facility] [address], [place], [place] [address] [phone]

## 2017-03-03 NOTE — Patient Instructions (Signed)
I had a long discussion with the patient and husband regarding her post herpetic neuralgic pain which appears to have shown partial response to Topamax. I recommend increasing the dose to 25 mg in the morning and 50 mg at night for 1 week and then to 50 mg twice daily as tolerated. Continue gabapentin at the present dose. She was also advised to continue   participation in  cognitively challenging activities like solving crossword puzzles, playing sudoku and bridge.  She returns for follow-up in 6 months with nurse practitioner call earlier if necessary

## 2017-03-04 NOTE — Telephone Encounter (Signed)
Spoke with pt today, and informed pt re: 1.  Bone density results showed Osteopenia.  Reinforced with pt to take Calcium with Vit D as instructed. 2.  Start  Exemestane  Now - as per Dr. Ernestina Penna instructions.  Pt voiced understanding and stated she will start in the am      03/05/17.

## 2017-03-09 ENCOUNTER — Ambulatory Visit (INDEPENDENT_AMBULATORY_CARE_PROVIDER_SITE_OTHER): Payer: Medicare Other | Admitting: Orthopedic Surgery

## 2017-03-09 ENCOUNTER — Ambulatory Visit (INDEPENDENT_AMBULATORY_CARE_PROVIDER_SITE_OTHER): Payer: Medicare Other

## 2017-03-09 ENCOUNTER — Encounter (INDEPENDENT_AMBULATORY_CARE_PROVIDER_SITE_OTHER): Payer: Self-pay | Admitting: Orthopedic Surgery

## 2017-03-09 DIAGNOSIS — L03032 Cellulitis of left toe: Secondary | ICD-10-CM

## 2017-03-09 DIAGNOSIS — L02612 Cutaneous abscess of left foot: Secondary | ICD-10-CM | POA: Diagnosis not present

## 2017-03-09 DIAGNOSIS — Z9861 Coronary angioplasty status: Secondary | ICD-10-CM

## 2017-03-09 DIAGNOSIS — M79672 Pain in left foot: Secondary | ICD-10-CM

## 2017-03-09 DIAGNOSIS — I2510 Atherosclerotic heart disease of native coronary artery without angina pectoris: Secondary | ICD-10-CM

## 2017-03-09 MED ORDER — DOXYCYCLINE HYCLATE 100 MG PO TABS: 100.0000 mg | ORAL_TABLET | Freq: Two times a day (BID) | ORAL | 0 refills | Status: DC

## 2017-03-09 NOTE — Progress Notes (Signed)
Office Visit Note   Patient: [name]           Date of Birth: [birth year]           MRN: [id number] Visit Date: 03/09/2017              Requested by: Redmond School, [place], [place] [address] PCP: Redmond School, MD  Chief Complaint  Patient presents with  . Left Foot - Follow-up      HPI: The patient is a 76 year old woman seen today for concern of infection to the left great toe with redness extending over dorsum of foot. Has been having some nausea and chills.   Assessment & Plan: Visit Diagnoses:  1. Pain in left foot   2. Abscess or cellulitis of toe, left     Plan: Provided prescription for doxycycline. Follow up in 1 week. Daily dial soap cleansing and antibacterial ointment dressing changes.   Follow-Up Instructions: Return in about 1 week (around 03/16/2017).   Ortho Exam  Patient is alert, oriented, no adenopathy, well-dressed, normal affect, normal respiratory effort. To the medial border of left great toe has fluctuant area with some ecchymosis. Debrided with a 10 blade knife back to viable tissue. No purulence expressed. Does have cellulitis to toe and over dorsum of forefoot. Underlying ulcer is 15 mm in diameter. Is just 2 mm deep.   Imaging: No results found. No images are attached to the encounter.  Labs: Lab Results  Component Value Date   HGBA1C 5.7 (H) 12/07/2016   HGBA1C 5.8 (H) 05/06/2016   HGBA1C 6.0 (H) 11/17/2015   LABURIC 7.9 (H) 11/11/2016   REPTSTATUS 11/18/2015 FINAL 11/17/2015   CULT (A) 11/17/2015    50,000 COLONIES/mL LACTOBACILLUS SPECIES Standardized susceptibility testing for this organism is not available.    LABORGA ESCHERICHIA COLI 12/31/2009    _0 (HGBA1)@  There is no height or weight on file to calculate BMI.  Orders:  Orders Placed This Encounter  Procedures  . XR Foot Complete Left   No orders of the defined types were placed in this encounter.    Procedures: No  procedures performed  Clinical Data: No additional findings.  ROS:  All other systems negative, except as noted in the HPI. Review of Systems  Constitutional: Negative for chills and fever.  Cardiovascular: Negative for leg swelling.  Skin: Positive for color change and wound.    Objective: Vital Signs: There were no vitals taken for this visit.  Specialty Comments:  No specialty comments available.  PMFS History: Patient Active Problem List   Diagnosis Date Noted  . Genetic testing 12/03/2016  . 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 Malignant neoplasm of upper-outer quadrant of left breast in female, estrogen receptor positive ([place]) 11/19/2016  . Chronic idiopathic gout involving toe of left foot without tophus 11/15/2016  . Malignant 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, estrogen receptor positive ([place]) 11/09/2016  . Tendon tear, ankle, left, sequela   . Traumatic rupture of left anterior tibial tendon 04/20/2016  . Acute on chronic kidney failure ([place])   . 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 Headache, migraine   . Unexplained weight loss 11/17/2015  . Acute kidney injury superimposed on chronic kidney disease ([place]) 11/17/2015  . Restless legs syndrome 07/24/2015  . Mild cognitive impairment 01/08/2015  . Abnormality of gait 01/08/2015  . Dizziness 12/19/2014  . Preoperative cardiovascular examination 12/11/2013  . GERD (gastroesophageal reflux  disease) 10/06/2013  . Syncope 10/05/2013  . Dementia 12/06/2012  . Chest pain at rest 10/28/2012  . Anxiety 10/28/2012    Class: Acute  . Edema of both legs 10/28/2012  . CAD S/P percutaneous coronary angioplasty   . Essential hypertension   . Dyslipidemia, goal LDL below 70    Past Medical History:  Diagnosis Date  . Anxiety   . Arthritis     osteoarthritis  . Breast cancer in female [facility])    Bilateral  . CAD S/P percutaneous coronary angioplasty 2007; March 2011     [employer] 3.0 mm 20 mm postdilated 3.6 mm in early mid LAD; status post ISR Cutting Balloon PTCA and March 11 along with PCI of distal mid lesion with a 3.0 mm 12 mm MultiLink vision BMS; the proximal stent causes jailing of SP1 and SP2 with ostial 70-80% lesions  . Cancer (HCC)    Melanoma, Squamous cell Carcinoma  . CHF (congestive heart failure) ([place])   . Chronic kidney disease    stage 2   . Dementia    mild  . Diabetes mellitus type 2 with neurological manifestations ([place])   . Dyslipidemia, goal LDL below 70   . Full dentures   . Genetic testing 12/03/2016   Germline genetic testing was performed through [employer]'s Common Hereditary Cancers Panel + [facility]'s Melanoma Panel. This custom panel includes analysis of the following 51 genes: APC, ATM, AXIN2, BAP1, BARD1, BMPR1A, BRCA1, BRCA2, BRIP1, CDH1, CDK4, CDKN2A, CHEK2, CTNNA1, DICER1, EPCAM, GREM1, HOXB13, KIT, MEN1, MITF, MLH1, MSH2, MSH3, MSH6, MUTYH, NBN, NF1, NTHL1, PALB2, PDGFRA, PMS2, POLD1, POL  . GERD (gastroesophageal reflux disease)   . Headache(784.0)   . History of hematuria    Followed by Dr. Gaynelle Arabian  . Hypertension, essential, benign   . Nausea & vomiting 10/2015  . Peripheral neuropathy   . Stroke ([place])   . TIA (transient ischemic attack)    multiple in the past  . Wears glasses     Family History  Problem Relation Age of Onset  . Diabetes Mother   . Hypertension Mother   . Kidney disease Mother   . Hypertension Father   . Emphysema Father   . Heart attack Father   . Heart disease Father   . Arthritis Sister   . Diabetes Sister   . Hypertension Sister   . Breast cancer Sister 72       treated with mastectomy  . Cervical cancer Sister 34  . Hypertension Brother   . Hyperlipidemia Brother   . Diabetes Brother   . Stroke Brother   . Diabetes Sister   . Hypertension Sister   . Stomach cancer Sister 52  . Hypertension Brother   . ALS Brother        d.62s  . Breast cancer Daughter 87        triple negative treated with lumpectomy, chemo, radiation  . Heart attack Daughter 47       d.45  . COPD Daughter   . Cancer Paternal Aunt        unspecified type  . Cancer Paternal Uncle        unspecified type  . Cancer Paternal Uncle        unspecified type    Past Surgical History:  Procedure Laterality Date  . ABDOMINAL HYSTERECTOMY    . APPENDECTOMY    . BLADDER SUSPENSION    . BREAST LUMPECTOMY WITH RADIOACTIVE SEED AND SENTINEL LYMPH NODE BIOPSY Bilateral 12/14/2016  Procedure: BILATERAL BREAST LUMPECTOMY WITH BILATERAL  RADIOACTIVE SEED AND BILATERAL AXILLARY  SENTINEL LYMPH NODE BIOPSY ERAS PATHWAY;  Surgeon: Alphonsa Overall, MD;  Location: [place];  Service: General;  Laterality: Bilateral;  PECTORAL BLOCK  . CARDIAC CATHETERIZATION  02/24/2006   85% stenosis in the proximal portion of LAD-arrangements made for PCI on 02/25/2006  . CARDIAC CATHETERIZATION  02/25/2006   80% LAD lesion stented with a 3x42m Liberte stent resulting in reduction of 80% lesion to 0% residual  . CARDIAC CATHETERIZATION  04/26/2006   Medical management  . CARDIAC CATHETERIZATION  06/24/2009   60-70% re-stenosis in the proximal LAD. A 3.25x15 cutting balloon, 3 inflations - 14atm-38sec, 13atm-39sec, and 12atm-40sec reduced to less than 10%. 60% stenosis of the mid/distal LAD stented with a 3x141mMultilink stent.  . Marland KitchenARDIAC CATHETERIZATION  07/09/2009   Medical management  . CAROTID DOPPLER  06/24/2009   40-59% R ICA stenosis. No significant ICA stenosis noted  . CHOLECYSTECTOMY    . DILATION AND CURETTAGE OF UTERUS    . JOINT REPLACEMENT Left   . LESION REMOVAL Right 03/11/2015   Procedure:  EXCISION OF RIGHT PRE TIBIAL LESION;  Surgeon: JaJudeth HornMD;  [place] [facility] Service: General;  Laterality: Right;  . MASS EXCISION Left 06/10/2014   Procedure: EXCISION LEFT LOWER LEG LESION;  Surgeon: JaDoreen SalvageMD;  [place] [facility] Service: General;  Laterality:  Left;  . Marland KitchenASS EXCISION Left 09/05/2015   Procedure: EXCISION OF LEFT FOREARM  MASS;  Surgeon: JaJudeth HornMD;  [place] [facility] Service: General;  Laterality: Left;  . NM MYOVIEW LTD  11/16/2011   5 beats a PVC during recovery.  No skin or infarction.  Reached 5 METs, EKG negative for ischemia   . SHOULDER ARTHROSCOPY  10/15  . SHOULDER ARTHROSCOPY  1995   left  . TENDON REPAIR Left 05/12/2016   Procedure: Left Anterior Tibial Tendon Reconstruction;  Surgeon: MaNewt MinionMD;  [place] [facility] Service: Orthopedics;  Laterality: Left;  . TOTAL KNEE ARTHROPLASTY  2005   left  . TRANSTHORACIC ECHOCARDIOGRAM  10/2015   EF 55-60 %. Mild LVH. Mild pulmonary hypertension. Normal valves.  . TRANSTHORACIC ECHOCARDIOGRAM  12/31/2009/July 2015   IsLV size & function, Gr 1 DD w/ mild TR.;; b)09/2013: 55-60%. Grade 1 DD, mild aortic sclerosis  . WRIST ARTHROPLASTY  2010   cancer lt wrist   Social History   Occupational History  . Occupation: retired    EmFish farm managerRETIRED  Tobacco Use  . Smoking status: Never Smoker  . Smokeless tobacco: Never Used  Substance and Sexual Activity  . Alcohol use: No  . Drug use: No  . Sexual activity: No

## 2017-03-17 ENCOUNTER — Encounter (INDEPENDENT_AMBULATORY_CARE_PROVIDER_SITE_OTHER): Payer: Self-pay | Admitting: Family

## 2017-03-17 ENCOUNTER — Ambulatory Visit (INDEPENDENT_AMBULATORY_CARE_PROVIDER_SITE_OTHER): Payer: Medicare Other | Admitting: Family

## 2017-03-17 DIAGNOSIS — M6702 Short Achilles tendon (acquired), left ankle: Secondary | ICD-10-CM

## 2017-03-17 DIAGNOSIS — Z9861 Coronary angioplasty status: Secondary | ICD-10-CM

## 2017-03-17 DIAGNOSIS — L03116 Cellulitis of left lower limb: Secondary | ICD-10-CM

## 2017-03-17 DIAGNOSIS — I2510 Atherosclerotic heart disease of native coronary artery without angina pectoris: Secondary | ICD-10-CM

## 2017-03-17 NOTE — Progress Notes (Signed)
Office Visit Note   Patient: [name]           Date of Birth: October 15, 1940           MRN: [id number] Visit Date: 03/17/2017              Requested by: [address], [place], [place] [address] PCP: Redmond School, MD  Chief Complaint  Patient presents with  . Left Foot - Follow-up      HPI: The patient is a 76 year old woman seen today in follow for abscess and cellulitis to left great toe. Is completing a course of Doxycycline. Spouse reports 48 hours in to abx saw remarkable improvement in wound.  Today is also complaining of pain over dorsum of foot along medial column.   Assessment & Plan: Visit Diagnoses:  1. Heel cord tightness, left   2. Cellulitis of left lower extremity     Plan: continue with daily foot checks. Work on heel cord stretching which was demonstrated to patient. She understands exercises and will work on this. Follow up in office in 3 months.  Follow-Up Instructions: No Follow-up on file.   Ortho Exam  Patient is alert, oriented, no adenopathy, well-dressed, normal affect, normal respiratory effort. To the medial border of left great toe has well healed ulceration. No erythema or open area. No sign of infecton. no bony tenderness to foot. Does have heel cord tightness with dorsiflexion 20 degrees shy of neutral. No achilles tenderness.   Imaging: No results found. No images are attached to the encounter.  Labs: Lab Results  Component Value Date   HGBA1C 5.7 (H) 12/07/2016   HGBA1C 5.8 (H) 05/06/2016   HGBA1C 6.0 (H) 11/17/2015   LABURIC 7.9 (H) 11/11/2016   REPTSTATUS 11/18/2015 FINAL 11/17/2015   CULT (A) 11/17/2015    50,000 COLONIES/mL LACTOBACILLUS SPECIES Standardized susceptibility testing for this organism is not available.    LABORGA ESCHERICHIA COLI 12/31/2009    '@LABSALLVALUES'$ (HGBA1)@  There is no height or weight on file to calculate BMI.  Orders:  No orders of the defined types were placed in  this encounter.  No orders of the defined types were placed in this encounter.    Procedures: No procedures performed  Clinical Data: No additional findings.  ROS:  All other systems negative, except as noted in the HPI. Review of Systems  Constitutional: Negative for chills and fever.  Cardiovascular: Negative for leg swelling.  Skin: Positive for color change and wound.    Objective: Vital Signs: There were no vitals taken for this visit.  Specialty Comments:  No specialty comments available.  PMFS History: Patient Active Problem List   Diagnosis Date Noted  . Genetic testing 12/03/2016  . 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 Malignant neoplasm of upper-outer quadrant of left breast in female, estrogen receptor positive ([place]) 11/19/2016  . Chronic idiopathic gout involving toe of left foot without tophus 11/15/2016  . Malignant 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, estrogen receptor positive ([place]) 11/09/2016  . Tendon tear, ankle, left, sequela   . Traumatic rupture of left anterior tibial tendon 04/20/2016  . Acute on chronic kidney failure ([place])   . 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 Headache, migraine   . Unexplained weight loss 11/17/2015  . Acute kidney injury superimposed on chronic kidney disease ([place]) 11/17/2015  . Restless legs syndrome 07/24/2015  . Mild cognitive impairment 01/08/2015  . Abnormality of gait  01/08/2015  . Dizziness 12/19/2014  . Preoperative cardiovascular examination 12/11/2013  . GERD (gastroesophageal reflux disease) 10/06/2013  . Syncope 10/05/2013  . Dementia 12/06/2012  . Chest pain at rest 10/28/2012  . Anxiety 10/28/2012    Class: Acute  . Edema of both legs 10/28/2012  . CAD S/P percutaneous coronary angioplasty   . Essential hypertension   . Dyslipidemia, goal LDL below 70    Past Medical History:  Diagnosis Date  . Anxiety   . Arthritis     osteoarthritis  .  Breast cancer in female [facility])    Bilateral  . CAD S/P percutaneous coronary angioplasty 2007; March 2011   [facility] [facility] 3.0 mm 20 mm postdilated 3.6 mm in early mid LAD; status post ISR Cutting Balloon PTCA and March 11 along with PCI of distal mid lesion with a 3.0 mm 12 mm MultiLink vision BMS; the proximal stent causes jailing of SP1 and SP2 with ostial 70-80% lesions  . Cancer (HCC)    Melanoma, Squamous cell Carcinoma  . CHF (congestive heart failure) ([place])   . Chronic kidney disease    stage 2   . Dementia    mild  . Diabetes mellitus type 2 with neurological manifestations ([place])   . Dyslipidemia, goal LDL below 70   . Full dentures   . Genetic testing 12/03/2016   Germline genetic testing was performed through [employer]'s Common Hereditary Cancers Panel + [employer]'s Melanoma Panel. This custom panel includes analysis of the following 51 genes: APC, ATM, AXIN2, BAP1, BARD1, BMPR1A, BRCA1, BRCA2, BRIP1, CDH1, CDK4, CDKN2A, CHEK2, CTNNA1, DICER1, EPCAM, GREM1, HOXB13, KIT, MEN1, MITF, MLH1, MSH2, MSH3, MSH6, MUTYH, NBN, NF1, NTHL1, PALB2, PDGFRA, PMS2, POLD1, POL  . GERD (gastroesophageal reflux disease)   . Headache(784.0)   . History of hematuria    Followed by Dr. Gaynelle Arabian  . Hypertension, essential, benign   . Nausea & vomiting 10/2015  . Peripheral neuropathy   . Stroke ([place])   . TIA (transient ischemic attack)    multiple in the past  . Wears glasses     Family History  Problem Relation Age of Onset  . Diabetes Mother   . Hypertension Mother   . Kidney disease Mother   . Hypertension Father   . Emphysema Father   . Heart attack Father   . Heart disease Father   . Arthritis Sister   . Diabetes Sister   . Hypertension Sister   . Breast cancer Sister 65       treated with mastectomy  . Cervical cancer Sister 70  . Hypertension Brother   . Hyperlipidemia Brother   . Diabetes Brother   . Stroke Brother   . Diabetes Sister   . Hypertension Sister   . Stomach cancer  Sister 15  . Hypertension Brother   . ALS Brother        d.62s  . Breast cancer Daughter 80       triple negative treated with lumpectomy, chemo, radiation  . Heart attack Daughter 39       d.45  . COPD Daughter   . Cancer Paternal Aunt        unspecified type  . Cancer Paternal Uncle        unspecified type  . Cancer Paternal Uncle        unspecified type    Past Surgical History:  Procedure Laterality Date  . ABDOMINAL HYSTERECTOMY    . APPENDECTOMY    . BLADDER SUSPENSION    .  BREAST LUMPECTOMY WITH RADIOACTIVE SEED AND SENTINEL LYMPH NODE BIOPSY Bilateral 12/14/2016   Procedure: BILATERAL BREAST LUMPECTOMY WITH BILATERAL  RADIOACTIVE SEED AND BILATERAL AXILLARY  SENTINEL LYMPH NODE BIOPSY ERAS PATHWAY;  Surgeon: Alphonsa Overall, MD;  Location: [place];  Service: General;  Laterality: Bilateral;  PECTORAL BLOCK  . CARDIAC CATHETERIZATION  02/24/2006   85% stenosis in the proximal portion of LAD-arrangements made for PCI on 02/25/2006  . CARDIAC CATHETERIZATION  02/25/2006   80% LAD lesion stented with a 3x52m Liberte stent resulting in reduction of 80% lesion to 0% residual  . CARDIAC CATHETERIZATION  04/26/2006   Medical management  . CARDIAC CATHETERIZATION  06/24/2009   60-70% re-stenosis in the proximal LAD. A 3.25x15 cutting balloon, 3 inflations - 14atm-38sec, 13atm-39sec, and 12atm-40sec reduced to less than 10%. 60% stenosis of the mid/distal LAD stented with a 3x120mMultilink stent.  . Marland KitchenARDIAC CATHETERIZATION  07/09/2009   Medical management  . CAROTID DOPPLER  06/24/2009   40-59% R ICA stenosis. No significant ICA stenosis noted  . CHOLECYSTECTOMY    . DILATION AND CURETTAGE OF UTERUS    . JOINT REPLACEMENT Left   . LESION REMOVAL Right 03/11/2015   Procedure:  EXCISION OF RIGHT PRE TIBIAL LESION;  Surgeon: JaJudeth HornMD;  [place] Service: General;  Laterality: Right;  . MASS EXCISION Left 06/10/2014   Procedure: EXCISION LEFT LOWER LEG  LESION;  Surgeon: JaDoreen SalvageMD;  [place] Service: General;  Laterality: Left;  . Marland KitchenASS EXCISION Left 09/05/2015   Procedure: EXCISION OF LEFT FOREARM  MASS;  Surgeon: JaJudeth HornMD;  [place] Service: General;  Laterality: Left;  . NM MYOVIEW LTD  11/16/2011   5 beats a PVC during recovery.  No skin or infarction.  Reached 5 METs, EKG negative for ischemia   . SHOULDER ARTHROSCOPY  10/15  . SHOULDER ARTHROSCOPY  1995   left  . TENDON REPAIR Left 05/12/2016   Procedure: Left Anterior Tibial Tendon Reconstruction;  Surgeon: MaNewt MinionMD;  [place]: [place] Service: Orthopedics;  Laterality: Left;  . TOTAL KNEE ARTHROPLASTY  2005   left  . TRANSTHORACIC ECHOCARDIOGRAM  10/2015   EF 55-60 %. Mild LVH. Mild pulmonary hypertension. Normal valves.  . TRANSTHORACIC ECHOCARDIOGRAM  12/31/2009/July 2015   IsLV size & function, Gr 1 DD w/ mild TR.;; b)09/2013: 55-60%. Grade 1 DD, mild aortic sclerosis  . WRIST ARTHROPLASTY  2010   cancer lt wrist   Social History   Occupational History  . Occupation: retired    EmFish farm managerRETIRED  Tobacco Use  . Smoking status: Never Smoker  . Smokeless tobacco: Never Used  Substance and Sexual Activity  . Alcohol use: No  . Drug use: No  . Sexual activity: No

## 2017-03-18 ENCOUNTER — Telehealth: Payer: Self-pay | Admitting: *Deleted

## 2017-03-18 NOTE — Telephone Encounter (Signed)
Called patient to ask about moving fu appt. up from 2:30 pm to 10 am on 03-21-17, patient agreed to the new appt. time

## 2017-03-21 ENCOUNTER — Ambulatory Visit
Admission: RE | Admit: 2017-03-21 | Discharge: 2017-03-21 | Disposition: A | Discharge status: Home / Self Care | Payer: Medicare Other | Source: Ambulatory Visit | Attending: Radiation Oncology | Admitting: Radiation Oncology

## 2017-03-21 ENCOUNTER — Encounter: Payer: Self-pay | Admitting: Radiation Oncology

## 2017-03-21 ENCOUNTER — Other Ambulatory Visit: Payer: Self-pay

## 2017-03-21 VITALS — BP 120/82 | HR 79 | Temp 97.7°F | Resp 18 | Ht 66.0 in | Wt 174.4 lb

## 2017-03-21 DIAGNOSIS — Z923 Personal history of irradiation: Secondary | ICD-10-CM | POA: Diagnosis not present

## 2017-03-21 DIAGNOSIS — Z79899 Other long term (current) drug therapy: Secondary | ICD-10-CM | POA: Diagnosis not present

## 2017-03-21 DIAGNOSIS — C50912 Malignant neoplasm of unspecified site of left female breast: Secondary | ICD-10-CM | POA: Insufficient documentation

## 2017-03-21 DIAGNOSIS — I959 Hypotension, unspecified: Secondary | ICD-10-CM | POA: Insufficient documentation

## 2017-03-21 DIAGNOSIS — Z170 Estrogen receptor positive status [ER+]: Secondary | ICD-10-CM

## 2017-03-21 DIAGNOSIS — Z888 Allergy status to other drugs, medicaments and biological substances status: Secondary | ICD-10-CM | POA: Insufficient documentation

## 2017-03-21 DIAGNOSIS — B0229 Other postherpetic nervous system involvement: Secondary | ICD-10-CM | POA: Diagnosis not present

## 2017-03-21 DIAGNOSIS — Z79811 Long term (current) use of aromatase inhibitors: Secondary | ICD-10-CM | POA: Diagnosis not present

## 2017-03-21 DIAGNOSIS — R42 Dizziness and giddiness: Secondary | ICD-10-CM | POA: Diagnosis not present

## 2017-03-21 DIAGNOSIS — C50911 Malignant neoplasm of unspecified site of right female breast: Secondary | ICD-10-CM | POA: Diagnosis not present

## 2017-03-21 DIAGNOSIS — Z886 Allergy status to analgesic agent status: Secondary | ICD-10-CM | POA: Diagnosis not present

## 2017-03-21 DIAGNOSIS — Z7902 Long term (current) use of antithrombotics/antiplatelets: Secondary | ICD-10-CM | POA: Insufficient documentation

## 2017-03-21 DIAGNOSIS — C50211 Malignant neoplasm of upper-inner quadrant of right female breast: Secondary | ICD-10-CM

## 2017-03-21 DIAGNOSIS — C50412 Malignant neoplasm of upper-outer quadrant of left female breast: Secondary | ICD-10-CM

## 2017-03-21 NOTE — Progress Notes (Signed)
Radiation Oncology         [phone] ________________________________  Name: [name] MRN: [id number]  Date of Service: 03/21/2017 DOB: January 21, 1941  Post Treatment Note  CC: Kristen School, MD  Kristen Overall, MD  Diagnosis:  Stage IA,pT1cN0M0, ER/PR positive, grade 1, invasive ductal carcinoma of the right breast, and Stage IA, pT1bN0M0 grade 1 invasive ductal carcinoma of theleft breast.  Interval Since Last Radiation: 6 weeks   01/17/2017 to 02/11/2017: 1. The Left breast was treated to 42.5 Gy in 17 fractions of 2.5 Gy. 2. The Right breast was treated to 42.5 Gy in 17 fractions of 2.5 Gy. 3. The Right breast was boosted to 7.5 Gy in 3 fractions of 2.5 Gy. 4. The Left breast was boosted to 7.5 Gy in 3 fractions of 2.5 Gy.   Narrative:  The patient returns today for routine follow-up.  She tolerated radiotherapy well without significant desquamation.                           On review of systems, the patient states he has been feeling somewhat dizzy lately, and notes these episodes at different times of the day, she states that this morning she woke up and took her blood pressure after feeling this way and her pressure was 70/30.  She reports that she has not had any syncopal episodes but has had near syncope multiple times a week.  She denies any recent changes in her medication other than tapering of her topamax that she uses for her postherpetic neuralgia though this modification was made over 2 weeks ago.  No other complaints of acute visual or neurologic symptoms are noted.  She states that her visual acuity is gradually worsened and is in need of having another eye exam.  No other complaints are verbalized.  ALLERGIES:  is allergic to lipitor [atorvastatin]; lyrica [pregabalin]; nsaids; reglan [metoclopramide]; and ultram [tramadol].  Meds: Current Outpatient Medications  Medication Sig Dispense Refill  . allopurinol (ZYLOPRIM) 100 MG tablet Take 1 tablet (100 mg  total) by mouth daily. 30 tablet 6  . Biotin w/ Vitamins C & E (HAIR SKIN & NAILS GUMMIES PO) Take 2 tablets by mouth daily.     . Biotin w/ Vitamins C & E (HAIR/SKIN/NAILS PO) Take by mouth.    . bumetanide (BUMEX) 1 MG tablet Take 2 mg by mouth daily.     . busPIRone (BUSPAR) 7.5 MG tablet Take 7.5 mg by mouth 2 (two) times daily.    . Calcium Carb-Cholecalciferol (CALCIUM 600 + D PO) Take 1 tablet by mouth at bedtime.     . Cholecalciferol 2000 units CAPS Take 2,000 Units by mouth daily.    . clopidogrel (PLAVIX) 75 MG tablet Take 1 tablet (75 mg total) by mouth daily. 90 tablet 3  . COD LIVER OIL PO Take 415 mg by mouth daily.    . colchicine 0.6 MG tablet Take one tablet daily during acute gout attack for 3 days, discontinue after this. Resume in the event of another acute gout flare up. 30 tablet 5  . CRANBERRY PO Take 1 capsule by mouth daily.    . diphenhydrAMINE (BENADRYL) 50 MG capsule Take 50 mg by mouth at bedtime.    Marland Kitchen doxycycline (VIBRA-TABS) 100 MG tablet Take 1 tablet (100 mg total) by mouth 2 (two) times daily. 28 tablet 0  . exemestane (AROMASIN) 25 MG tablet Take 1 tablet (25 mg total) daily  after breakfast by mouth. 90 tablet 3  . Fish Oil-Krill Oil (KRILL OIL PLUS PO) Take 1 capsule by mouth daily.     Marland Kitchen gabapentin (NEURONTIN) 300 MG capsule Take 600 mg by mouth at bedtime.     . Iron-FA-B Cmp-C-Biot-Probiotic (FUSION PLUS) CAPS takes 1 tablet daily  9  . losartan (COZAAR) 100 MG tablet take 1 tablet daily  3  . Methylfol-Methylcob-Acetylcyst (CEREFOLIN NAC) 6-2-600 MG TABS TAKE 1 CAPLET BY MOUTH ONCE DAILY. 90 each 1  . metoprolol succinate (TOPROL XL) 50 MG 24 hr tablet Take 0.5 tablet (25 mg total) by mouth daily. 45 tablet 3  . pantoprazole (PROTONIX) 40 MG tablet TAKE 1 TABLET DAILY (CONTACT OFFICE FOR ADDITIONAL REFILLS, FINAL ATTEMPT) 90 tablet 0  . pramipexole (MIRAPEX) 0.25 MG tablet TAKE 1 TABLET AT 4 P.M. AND 2 TABLETS (0.5 MG) AT BEDTIME 270 tablet 0  .  Resveratrol (RESERVAPAK PO) Take 1 tablet by mouth every evening.     . rosuvastatin (CRESTOR) 20 MG tablet Take 20 mg by mouth at bedtime.    . topiramate (TOPAMAX) 50 MG tablet Take 0.5 tablets (25 mg total) by mouth 2 (two) times daily. 1803 tablet 3  . HYDROcodone-acetaminophen (NORCO/VICODIN) 5-325 MG tablet Take 1-2 tablets by mouth every 6 (six) hours as needed for moderate pain. (Patient not taking: Reported on 03/21/2017) 20 tablet 0  . Melatonin 5 MG CAPS Take 5 mg by mouth at bedtime.    . nitroGLYCERIN (NITROLINGUAL) 0.4 MG/SPRAY spray Place 1 spray under the tongue every 5 (five) minutes x 3 doses as needed for chest pain.      No current facility-administered medications for this encounter.     Physical Findings:  height is 5\' 6"  (1.676 m) and weight is 174 lb 6.4 oz (79.1 kg). Her oral temperature is 97.7 F (36.5 C). Her blood pressure is 120/82 and her pulse is 79. Her respiration is 18 and oxygen saturation is 100%.  Pain Assessment Pain Score: 0-No pain/10 In general this is a well appearing Caucasian female in no acute distress in no acute distress. She's alert and oriented x4 and appropriate throughout the examination. Cardiopulmonary assessment is negative for acute distress and she exhibits normal effort.  Bilateral breast exam is performed and reveals mild hyperpigmentation of bilateral breasts without evidence of desquamation.  Neurologically she appears to be intact grossly.   Lab Findings: Lab Results  Component Value Date   WBC 6.4 12/07/2016   HGB 12.6 12/07/2016   HCT 38.5 12/07/2016   MCV 91.9 12/07/2016   PLT 207 12/07/2016     Radiographic Findings: Dg Bone Density  Result Date: 02/24/2017 EXAM: DUAL X-Gren ABSORPTIOMETRY (DXA) FOR BONE MINERAL DENSITY IMPRESSION: Referring Physician:  Truitt [name] PATIENT: Name: [name] Patient ID: [id number] Birth Date: Mar 25, 1941 Height: 65.0 in. Sex: Female Measured: 02/24/2017 Weight: 174.6 lbs. Indications:  Advanced Age, Bilateral Ovariectomy (65.51), Breast Cancer History, Caucasian, Dementia (290.0), Estrogen Deficient, Gabapentin, Hysterectomy, Postmenopausal, Topamax Fractures: None Treatments: Calcium (E943.0), Vitamin D (E933.5) ASSESSMENT: The BMD measured at Femur Neck Left is 0.810 g/cm2 with a T-score of -1.6. This patient is considered OSTEOPENIC according to [facility]) criteria. L-3 and L-4 were excluded due to degenerative changes. There has been no statistically significant change in BMD of Left hip since prior exam dated 12/06/2000. Spine was not compared to prior study due to exclusion of vertebral bodies on current exam. Site Region Measured Date Measured Age YA T-score BMD Significant  CHANGE DualFemur Neck Left 1/24/2017    78.5         -1.6    0.810 g/cm2 AP Spine  L1-L2     02[age]24/2017    78.5         -0.9    1.061 g/cm2 World Health Organization [facility]) criteria for post-menopausal, Caucasian Women: Normal       T-score at or above -1 SD Osteopenia   T-score between -1 and -2.5 SD Osteoporosis T-score at or below -2.5 SD RECOMMENDATION: [place] recommends that FDA-approved medical therapies be considered in postmenopausal women and men age 55 or older with a: 1. Hip or vertebral (clinical or morphometric) fracture. 2. T-score of <-2.5 at the spine or hip. 3. Ten-year fracture probability by FRAX of 3% or greater for hip fracture or 20% or greater for major osteoporotic fracture. All treatment decisions require clinical judgment and consideration of individual patient factors, including patient preferences, co-morbidities, previous drug use, risk factors not captured in the FRAX model (e.g. falls, vitamin D deficiency, increased bone turnover, interval significant decline in bone density) and possible under - or over-estimation of fracture risk by FRAX. All patients should ensure an adequate intake of dietary calcium (1200 mg/d) and vitamin D (800 IU  daily) unless contraindicated. FOLLOW-UP: People with diagnosed cases of osteoporosis or at high risk for fracture should have regular bone mineral density tests. For patients eligible for Medicare, routine testing is allowed once every 2 years. The testing frequency can be increased to one year for patients who have rapidly progressing disease, those who are receiving or discontinuing medical therapy to restore bone mass, or have additional risk factors. I have reviewed this report, and agree with the above findings. Mark A. Thornton Papas, M.D.  Radiology FRAX* 10-year Probability of Fracture Based on femoral neck BMD: DualFemur (Left) Major Osteoporotic Fracture: 13.2% Hip Fracture:                3.2% Population:                  [place] (Caucasian) Risk Factors:                None *FRAX is a Materials engineer of the [employer] for Metabolic Bone Disease, a World Pharmacologist (WHO) [facility]. ASSESSMENT: The probability of a major osteoporotic fracture is 13.2 % within the next ten years. The probability of hip fracture is  3.2  % within the next 10 years. I have reviewed this report and agree with the above findings. Mark A. Thornton Papas, M.D. [facility] Radiology Electronically Signed   By: Lavonia Dana M.D.   On: 02/24/2017 10:40    Impression/Plan: 1. 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  The patient is doing well at this point, and we discussed skin care, as well as the importance of sunscreen to the sites where she is received radiation.  We would be happy to see her back as needed moving forward, otherwise she will continue with her antiestrogen therapy in the adjuvant setting and see [name] for follow-up in January 2019. 2. Survivorship.  The patient is scheduled to meet with survivorship clinic in February.  We discussed the  importance of these visits, and encouraged her to keep her appointment. 3. Hypotension and dizziness.  I encouraged the patient to follow-up with her cardiologist to discuss  modifying her metoprolol to see if this could help her symptoms.  She is in agreement with this as well.       Carola Rhine, PAC

## 2017-03-28 IMAGING — XA DG INJECT/[PERSON_NAME] INC NEEDLE/CATH/PLC EPI/CERV/THOR W/IMG
1 series · 1 of 1 positions shown · non-contrast
Comparison: none

CLINICAL DATA: Cervical spondylosis without myelopathy. Several
year improvement following previous cervical epidural procedures on
the LEFT. Pain now is in the RIGHT shoulder and arm.

[Series 1: ortho standard · 1 of 1 slices shown]
[im 1/1]
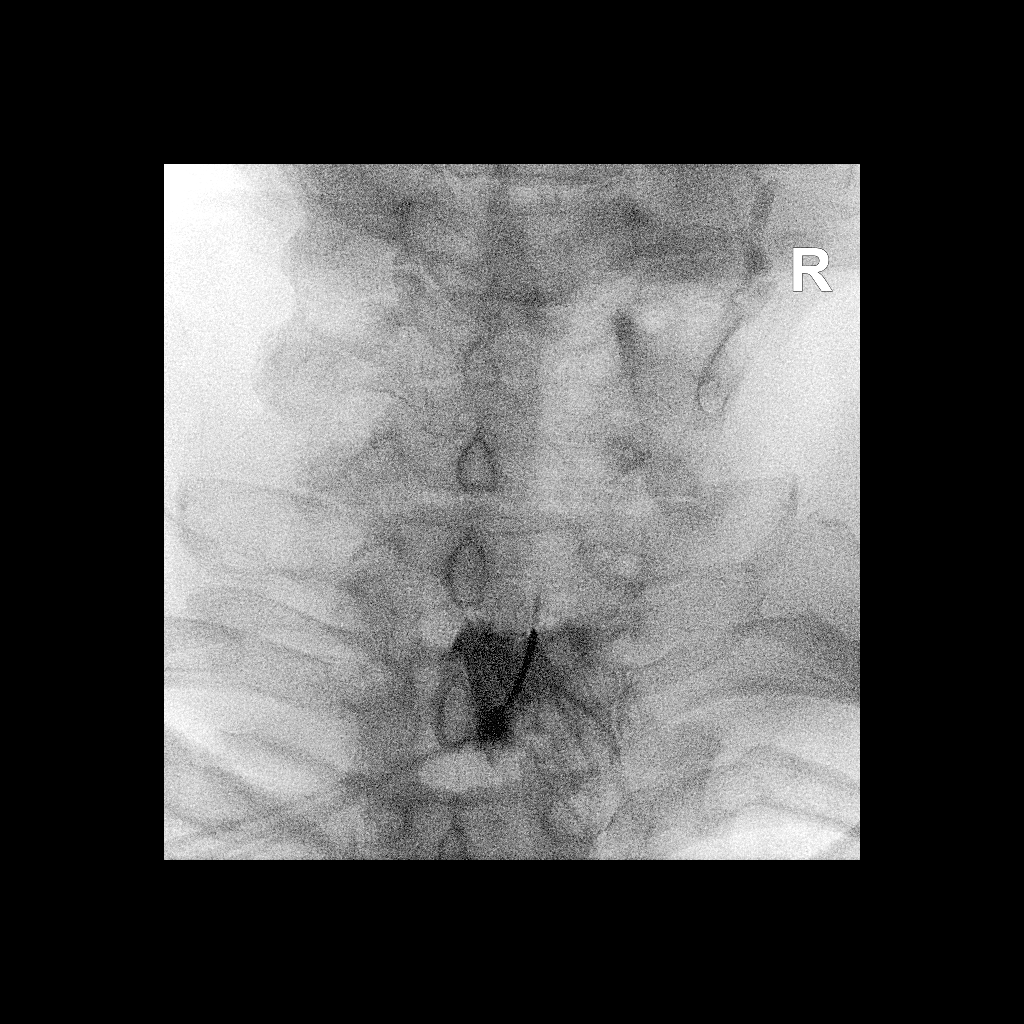

[1 of 1 positions shown; findings below may reference images not displayed]

FLUOROSCOPY TIME:  24 seconds corresponding to a Dose Area Product
of 7.96 ?Gy*m2

PROCEDURE:
Informed written consent was obtained.  Time-out was performed.

CERVICAL EPIDURAL INJECTION

Appropriate skin entry site was chosen, cleansed with Betadine, and
anesthetized with 1% lidocaine. An interlaminar approach was
performed on the RIGHT at T1-T2 . A 20 gauge epidural needle was
advanced using loss-of-resistance technique.

DIAGNOSTIC EPIDURAL INJECTION

Injection of Isovue-M 300 shows a good epidural pattern with spread
above and below the level of needle placement, primarily on the
RIGHT. No vascular opacification is seen.

THERAPEUTIC EPIDURAL INJECTION

1.5 ml of Kenalog 40 mixed with 1 ml of 1% Lidocaine and 2 ml of
normal saline were then instilled. The procedure was well-tolerated,
and the patient was discharged thirty minutes following the
injection in good condition.
IMPRESSION: Technically successful first epidural injection on the RIGHT at
T1-T2.

## 2017-04-03 ENCOUNTER — Telehealth: Payer: Self-pay | Admitting: Hematology

## 2017-04-03 NOTE — Telephone Encounter (Signed)
S/w pt advised of appt chg from 1/17 to 1/15 @ 3pm.

## 2017-04-08 ENCOUNTER — Other Ambulatory Visit: Payer: Self-pay | Admitting: Hematology

## 2017-04-08 DIAGNOSIS — M8580 Other specified disorders of bone density and structure, unspecified site: Secondary | ICD-10-CM

## 2017-04-08 DIAGNOSIS — Z1321 Encounter for screening for nutritional disorder: Secondary | ICD-10-CM

## 2017-04-08 DIAGNOSIS — C50211 Malignant neoplasm of upper-inner quadrant of right female breast: Secondary | ICD-10-CM

## 2017-04-08 DIAGNOSIS — Z170 Estrogen receptor positive status [ER+]: Secondary | ICD-10-CM

## 2017-04-11 NOTE — Progress Notes (Signed)
[place]  Telephone:([phone]) [phone] Fax:([phone]) [phone]  Clinic Follow Up Note   Patient Care Team: Redmond School, MD as PCP - General (Internal Medicine) Elmarie Shiley, MD as Consulting Physician (Nephrology) Leonie Man, MD as Consulting Physician (Cardiology) Alphonsa Overall, MD as Consulting Physician (General Surgery) Kyung Rudd, MD as Consulting Physician (Radiation Oncology) Truitt Merle, MD as Consulting Physician (Hematology) 04/12/2017   CHIEF COMPLAINTS:  Bilateral invasive breast cancer  Oncology History   Cancer Staging Malignant 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, estrogen receptor positive ([place]) Staging form: Breast, AJCC 8th Edition - Clinical: Stage IA (cT1c, cN0, cM0, G1, ER: Positive, PR: Positive, HER2: Negative) - Unsigned       Malignant 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, estrogen receptor positive ([place])   10/20/2016 Initial Biopsy    Diagnosis Breast, right, needle core biopsy, 11:30 o'clock - INVASIVE DUCTAL CARCINOMA,      10/20/2016 Initial Diagnosis    Malignant 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, estrogen receptor positive ([place])      10/20/2016 Receptors her2    Estrogen Receptor: 100%, POSITIVE, STRONG STAINING INTENSITY Progesterone Receptor: 100%, POSITIVE, STRONG STAINING INTENSITY Proliferation Marker Ki67: 5% HER2 NEGATIVE       10/20/2016 Mammogram    Diagnostic mammogram showed persistent distortion corresponds to a vaguely palpable irregular mass and is suspicious for malignancy.Targeted ultrasound is performed, showing an irregular hypoechoic mass with posterior acoustic shadowing in the 11:30 o'clock location of the right breast 4 cm from the nipple which measures 0.9 x 0.9 x0.8 cm. There is associated internal vascularity. Evaluation of the axilla is negative for adenopathy.      11/01/2016 Imaging    B/l BREAST MRI: IMPRESSION: 1. There is  a 1.5 cm biopsy proven malignancy in the upper slightly outer quadrant of the right breast. No additional sites of disease are identified.  2. There is an indeterminate 6 mm enhancing mass in the upper-outer left breast.      11/11/2016 Pathology Results    Diagnosis Breast, left, needle core biopsy, upper outer - INVASIVE DUCTAL CARCINOMA. - ATYPICAL LOBULAR HYPERPLASIA. - FIBROCYSTIC CHANGES AND PSEUDOANGIOMATOUS STROMAL HYPERPLASIA ([place]).      11/11/2016 Receptors her2     Estrogen Receptor: 100%, POSITIVE, STRONG STAINING INTENSITY Progesterone Receptor: 10%, POSITIVE, WEAK STAINING INTENSITY Proliferation Marker Ki67: 2% HER2 NEGATIVE       12/03/2016 Genetic Testing    Germline genetic testing was performed through [employer]'s Common Hereditary Cancers Panel + [facility]'s Melanoma Panel. This custom panel includes analysis of the following 51 genes: APC, ATM, AXIN2, BAP1, BARD1, BMPR1A, BRCA1, BRCA2, BRIP1, CDH1, CDK4, CDKN2A, CHEK2, CTNNA1, DICER1, EPCAM, GREM1, HOXB13, KIT, MEN1, MITF, MLH1, MSH2, MSH3, MSH6, MUTYH, NBN, NF1, NTHL1, PALB2, PDGFRA, PMS2, POLD1, POLE, POT1, PTEN, RAD50, RAD51C, RAD51D, RB1, SDHA, SDHB, SDHC, SDHD, SMAD4, SMARCA4, STK11, TP53, TSC1, TSC2, and VHL.  A variant of uncertain significance (VUS) was noted in BARD1.       12/14/2016 Miscellaneous    Oncotype Recurrence score of 16 10-year risk of distant recurrence with Tamoxifen alone is 10%      12/14/2016 Surgery    BILATERAL BREAST LUMPECTOMY WITH BILATERAL  RADIOACTIVE SEED AND BILATERAL AXILLARY  SENTINEL LYMPH NODE BIOPSY ERAS PATHWAY by Dr. Lucia Gaskins on 12/14/16      12/14/2016 Pathology Results    Diagnosis 12/14/16 1. Breast, lumpectomy, Left w/seed - INVASIVE DUCTAL CARCINOMA, 1 CM. - ATYPICAL LOBULAR HYPERPLASIA ([place]). -  PREVIOUS BIOPSY SITE. - INVASIVE CARCINOMA 0.2 CM FROM LATERAL MARGIN. - ATYPICA LOBULAR HYPERPLASIA LESS THAN 0.1 CM FROM LATERAL MARGIN. 2. Breast, lumpectomy, Right  w/seed - INVASIVE DUCTAL CARCINOMA, 1.1 CM. - FIBROCYSTIC CHANGES. - PREVIOUS BIOPSY SITE. - INVASIVE CARCINOMA 1.1 CM FROM POSTERIOR MARGIN. 3. Lymph node, sentinel, biopsy, Right Axillary Contents - BENIGN FIBROADIPOSE TISSUE. - NO LYMPH NODE TISSUE OR MALIGNANCY. 4. Lymph node, sentinel, biopsy, Left Axillary - ONE BENIGN LYMPH NODE (0/1).       01/17/2017 -  Radiation Therapy    Radiation with Dr. Lisbeth Renshaw starting on 01/17/17, plan to complete on 02/11/17            HISTORY OF PRESENTING ILLNESS:  Raiyah G Shewell 77 y.o. female is here because of newly diagnosed bilateral breast cancer. She reports to not feeling the lumps herself and cancer was found on screening mammogram followed by MR. 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 In August of 2018, she was also found to have invasive ductal carcinoma in the right breast as well. 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 She has a family history of breast cancer including her sister and daughter, both in their early 37s.   She began taking estrogen at 52 following a hysterectomy and bilateral salpingo-oophorectomy due to an ectopic pregnancy and ovarian cyst. 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   She is very active at home as she cleans her house and mows her own line. Per her husband, she is "very hyper and has trouble with her balance." She is otherwise well at home.   CURRENT THERAPY: Radiation with Dr. Lisbeth Renshaw started on 01/17/17, completed on 02/11/17. Started Exemestane week of 03/07/17  INTERVAL HISTORY:  DEVRA STARE is here for a follow up. She reports she is doing well overall. She completed her radiation with Dr. Lisbeth Renshaw on 02/11/17. She feels that she is getting her strength back after completing radiation. She notes that she has been experiencing some dizziness and brain fog that occurs daily. Her husband  reports that after she wakes up from a nap there is a slight absence of presence. She takes losartin and metoprolol for her HTN, but she has been on these medications for quite some time. She reports that she had a fall last week due to her dizziness and has some bruises on her elbows and knees. She sees Dr. Leonie Man (neurologist) and he diagnosed her with a cognitive disorder. She has shingles on the back of her right shoulder that does bother her. She takes 50 mg of Topamax and and 600 mg Neurontin daily. She is complaint with her Exemestane and reports no issues with this medication.   On review of systems, pt denies headaches Pertinent positives are listed and detailed within the above HPI.    MEDICAL HISTORY:  Past Medical History:  Diagnosis Date  . Anxiety   . Arthritis     osteoarthritis  . Breast cancer in female [facility])    Bilateral  . CAD S/P percutaneous coronary angioplasty 2007; March 2011   [employer] 3.0 mm 20 mm postdilated 3.6 mm in early mid LAD; status post ISR Cutting Balloon PTCA and March 11 along with PCI of distal mid lesion with a 3.0 mm 12 mm MultiLink vision BMS; the proximal stent causes jailing of SP1 and SP2 with ostial 70-80% lesions  . Cancer ([place])  Melanoma, Squamous cell Carcinoma  . CHF (congestive heart failure) ([place])   . Chronic kidney disease    stage 2   . Dementia    mild  . Diabetes mellitus type 2 with neurological manifestations ([place])   . Dyslipidemia, goal LDL below 70   . Full dentures   . Genetic testing 12/03/2016   Germline genetic testing was performed through [employer]'s Common Hereditary Cancers Panel + [facility]'s Melanoma Panel. This custom panel includes analysis of the following 51 genes: APC, ATM, AXIN2, BAP1, BARD1, BMPR1A, BRCA1, BRCA2, BRIP1, CDH1, CDK4, CDKN2A, CHEK2, CTNNA1, DICER1, EPCAM, GREM1, HOXB13, KIT, MEN1, MITF, MLH1, MSH2, MSH3, MSH6, MUTYH, NBN, NF1, NTHL1, PALB2, PDGFRA, PMS2, POLD1, POL  . GERD (gastroesophageal reflux  disease)   . Headache(784.0)   . History of hematuria    Followed by Dr. Gaynelle Arabian  . Hypertension, essential, benign   . Nausea & vomiting 10/2015  . Peripheral neuropathy   . Stroke ([place])   . TIA (transient ischemic attack)    multiple in the past  . Wears glasses     SURGICAL HISTORY: Past Surgical History:  Procedure Laterality Date  . ABDOMINAL HYSTERECTOMY    . APPENDECTOMY    . BLADDER SUSPENSION    . BREAST LUMPECTOMY WITH RADIOACTIVE SEED AND SENTINEL LYMPH NODE BIOPSY Bilateral 12/14/2016   Procedure: BILATERAL BREAST LUMPECTOMY WITH BILATERAL  RADIOACTIVE SEED AND BILATERAL AXILLARY  SENTINEL LYMPH NODE BIOPSY ERAS PATHWAY;  Surgeon: Alphonsa Overall, MD;  Location: [facility];  Service: General;  Laterality: Bilateral;  PECTORAL BLOCK  . CARDIAC CATHETERIZATION  02/24/2006   85% stenosis in the proximal portion of LAD-arrangements made for PCI on 02/25/2006  . CARDIAC CATHETERIZATION  02/25/2006   80% LAD lesion stented with a 3x86m Liberte stent resulting in reduction of 80% lesion to 0% residual  . CARDIAC CATHETERIZATION  04/26/2006   Medical management  . CARDIAC CATHETERIZATION  06/24/2009   60-70% re-stenosis in the proximal LAD. A 3.25x15 cutting balloon, 3 inflations - 14atm-38sec, 13atm-39sec, and 12atm-40sec reduced to less than 10%. 60% stenosis of the mid/distal LAD stented with a 3x146mMultilink stent.  . Marland KitchenARDIAC CATHETERIZATION  07/09/2009   Medical management  . CAROTID DOPPLER  06/24/2009   40-59% R ICA stenosis. No significant ICA stenosis noted  . CHOLECYSTECTOMY    . DILATION AND CURETTAGE OF UTERUS    . JOINT REPLACEMENT Left   . LESION REMOVAL Right 03/11/2015   Procedure:  EXCISION OF RIGHT PRE TIBIAL LESION;  Surgeon: JaJudeth HornMD;  [facility] Service: General;  Laterality: Right;  . MASS EXCISION Left 06/10/2014   Procedure: EXCISION LEFT LOWER LEG LESION;  Surgeon: JaDoreen SalvageMD;  [facility]  Service: General;  Laterality: Left;  . Marland KitchenASS EXCISION Left 09/05/2015   Procedure: EXCISION OF LEFT FOREARM  MASS;  Surgeon: JaJudeth HornMD;  [facility] Service: General;  Laterality: Left;  . NM MYOVIEW LTD  11/16/2011   5 beats a PVC during recovery.  No skin or infarction.  Reached 5 METs, EKG negative for ischemia   . SHOULDER ARTHROSCOPY  10/15  . SHOULDER ARTHROSCOPY  1995   left  . TENDON REPAIR Left 05/12/2016   Procedure: Left Anterior Tibial Tendon Reconstruction;  Surgeon: MaNewt MinionMD;  [facility] Service: Orthopedics;  Laterality: Left;  . TOTAL KNEE ARTHROPLASTY  2005   left  . TRANSTHORACIC ECHOCARDIOGRAM  10/2015  EF 55-60 %. Mild LVH. Mild pulmonary hypertension. Normal valves.  . TRANSTHORACIC ECHOCARDIOGRAM  12/31/2009/July 2015   IsLV size & function, Gr 1 DD w/ mild TR.;; b)09/2013: 55-60%. Grade 1 DD, mild aortic sclerosis  . WRIST ARTHROPLASTY  2010   cancer lt wrist    SOCIAL HISTORY: Social History   Socioeconomic History  . Marital status: Married    Spouse name: [name]  . Number of children: 3  . Years of education: 31  . Highest education level: Not on file  Social Needs  . Financial resource strain: Not on file  . Food insecurity - worry: Not on file  . Food insecurity - inability: Not on file  . Transportation needs - medical: Not on file  . Transportation needs - non-medical: Not on file  Occupational History  . Occupation: retired    Fish farm manager: RETIRED  Tobacco Use  . Smoking status: Never Smoker  . Smokeless tobacco: Never Used  Substance and Sexual Activity  . Alcohol use: No  . Drug use: No  . Sexual activity: No  Other Topics Concern  . Not on file  Social History Narrative   She is a married mother of 62, grandmother of 2.  Usually very active and out about the house.  But currently over last 6-8 weeks has been incapacitated.  She never smoked and does not drink alcohol.    FAMILY HISTORY: Family  History  Problem Relation Age of Onset  . Diabetes Mother   . Hypertension Mother   . Kidney disease Mother   . Hypertension Father   . Emphysema Father   . Heart attack Father   . Heart disease Father   . Arthritis Sister   . Diabetes Sister   . Hypertension Sister   . Breast cancer Sister 62       treated with mastectomy  . Cervical cancer Sister 72  . Hypertension Brother   . Hyperlipidemia Brother   . Diabetes Brother   . Stroke Brother   . Diabetes Sister   . Hypertension Sister   . Stomach cancer Sister 21  . Hypertension Brother   . ALS Brother        d.62s  . Breast cancer Daughter 52       triple negative treated with lumpectomy, chemo, radiation  . Heart attack Daughter 108       d.45  . COPD Daughter   . Cancer Paternal Aunt        unspecified type  . Cancer Paternal Uncle        unspecified type  . Cancer Paternal Uncle        unspecified type    ALLERGIES:  is allergic to lipitor [atorvastatin]; lyrica [pregabalin]; nsaids; reglan [metoclopramide]; and ultram [tramadol].  MEDICATIONS:  Current Outpatient Medications  Medication Sig Dispense Refill  . allopurinol (ZYLOPRIM) 100 MG tablet Take 1 tablet (100 mg total) by mouth daily. 30 tablet 6  . Biotin w/ Vitamins C & E (HAIR SKIN & NAILS GUMMIES PO) Take 2 tablets by mouth daily.     . bumetanide (BUMEX) 1 MG tablet Take 2 mg by mouth daily.     . busPIRone (BUSPAR) 7.5 MG tablet Take 7.5 mg by mouth at bedtime.     . Calcium Carb-Cholecalciferol (CALCIUM 600 + D PO) Take 1 tablet by mouth at bedtime.     . Cholecalciferol 2000 units CAPS Take 2,000 Units by mouth daily.    . clopidogrel (PLAVIX) 75 MG  tablet Take 1 tablet (75 mg total) by mouth daily. 90 tablet 3  . COD LIVER OIL PO Take 415 mg by mouth daily.    Marland Kitchen CRANBERRY PO Take 1 capsule by mouth daily.    Marland Kitchen exemestane (AROMASIN) 25 MG tablet Take 1 tablet (25 mg total) daily after breakfast by mouth. 90 tablet 3  . Fish Oil-Krill Oil (KRILL OIL  PLUS PO) Take 1 capsule by mouth daily.     Marland Kitchen gabapentin (NEURONTIN) 300 MG capsule Take 600 mg by mouth at bedtime.     . Iron-FA-B Cmp-C-Biot-Probiotic (FUSION PLUS) CAPS takes 1 tablet daily  9  . losartan (COZAAR) 100 MG tablet take 1 tablet daily  3  . Melatonin 5 MG CAPS Take 5 mg by mouth at bedtime.    . Methylfol-Methylcob-Acetylcyst (CEREFOLIN NAC) 6-2-600 MG TABS TAKE 1 CAPLET BY MOUTH ONCE DAILY. 90 each 1  . metoprolol succinate (TOPROL XL) 50 MG 24 hr tablet Take 0.5 tablet (25 mg total) by mouth daily. 45 tablet 3  . pantoprazole (PROTONIX) 40 MG tablet TAKE 1 TABLET DAILY (CONTACT OFFICE FOR ADDITIONAL REFILLS, FINAL ATTEMPT) 90 tablet 0  . pramipexole (MIRAPEX) 0.25 MG tablet TAKE 1 TABLET AT 4 P.M. AND 2 TABLETS (0.5 MG) AT BEDTIME 270 tablet 0  . Resveratrol (RESERVAPAK PO) Take 1 tablet by mouth every evening.     . rosuvastatin (CRESTOR) 20 MG tablet Take 20 mg by mouth at bedtime.    . topiramate (TOPAMAX) 50 MG tablet Take 0.5 tablets (25 mg total) by mouth 2 (two) times daily. (Patient taking differently: Take 50 mg by mouth 2 (two) times daily. ) 1803 tablet 3  . colchicine 0.6 MG tablet Take one tablet daily during acute gout attack for 3 days, discontinue after this. Resume in the event of another acute gout flare up. (Patient not taking: Reported on 04/12/2017) 30 tablet 5  . diphenhydrAMINE (BENADRYL) 50 MG capsule Take 50 mg by mouth at bedtime.    Marland Kitchen HYDROcodone-acetaminophen (NORCO/VICODIN) 5-325 MG tablet Take 1-2 tablets by mouth every 6 (six) hours as needed for moderate pain. (Patient not taking: Reported on 03/21/2017) 20 tablet 0  . nitroGLYCERIN (NITROLINGUAL) 0.4 MG/SPRAY spray Place 1 spray under the tongue every 5 (five) minutes x 3 doses as needed for chest pain.      No current facility-administered medications for this visit.     REVIEW OF SYSTEMS:   Constitutional: Denies fevers, chills or abnormal night sweats Eyes: Denies blurriness of vision,  double vision or watery eyes Ears, nose, mouth, throat, and face: Denies mucositis or sore throat Respiratory: Denies cough, dyspnea or wheezes Cardiovascular: Denies palpitation, chest discomfort or lower extremity swelling Gastrointestinal:  Denies nausea, heartburn or change in bowel habits Skin: Denies abnormal skin rashes Breast: (+) soreness and redness of breasts from radiation  Lymphatics: Denies new lymphadenopathy or easy bruising MSK: (+) Arthritis  Neurological:Denies numbness, tingling or new weaknesses (+) dizziness Behavioral/Psych: Mood is stable, no new changes  All other systems were reviewed with the patient and are negative.  PHYSICAL EXAMINATION:  ECOG PERFORMANCE STATUS: 0 - Asymptomatic  Vitals:   04/12/17 1529  BP: (!) 187/89  Pulse: 73  Resp: 20  Temp: 97.9 F (36.6 C)  SpO2: 100%   Filed Weights   04/12/17 1529  Weight: 178 lb (80.7 kg)    GENERAL:alert, no distress and comfortable SKIN: skin color, texture, turgor are normal, no rashes or significant lesions EYES: normal, conjunctiva are  pink and non-injected, sclera clear OROPHARYNX:no exudate, no erythema and lips, buccal mucosa, and tongue normal  NECK: supple, thyroid normal size, non-tender, without nodularity LYMPH:  no palpable lymphadenopathy in the cervical, axillary or inguinal LUNGS: clear to auscultation and percussion with normal breathing effort HEART: regular rate & rhythm and no murmurs and no lower extremity edema ABDOMEN:abdomen soft, non-tender and normal bowel sounds Musculoskeletal:no cyanosis of digits and no clubbing  PSYCH: alert & oriented x 3 with fluent speech NEURO: no focal motor/sensory deficits Breasts: Breast inspection showed them to be symmetrical with no nipple discharge. Palpation of the breasts and axilla revealed no obvious mass that I could appreciate.  LABORATORY DATA:  I have reviewed the data as listed CBC Latest Ref Rng & Units 04/12/2017 12/07/2016  11/13/2016  WBC 4.0 - 10.5 K/uL - 6.4 9.0  Hemoglobin 12.0 - 15.0 g/dL - 12.6 12.2  Hematocrit 34.8 - 46.6 % 38.0 38.5 35.6(L)  Platelets 150 - 400 K/uL - 207 242   CMP Latest Ref Rng & Units 04/12/2017 12/07/2016 11/13/2016  Glucose 70 - 140 mg/dL 111 119(H) 111(H)  BUN 7 - 26 mg/dL 15 23(H) 23(H)  Creatinine 0.44 - 1.00 mg/dL - 1.51(H) 1.87(H)  Sodium 136 - 145 mmol/L 141 139 137  Potassium 3.3 - 4.7 mmol/L 3.6 4.9 4.3  Chloride 98 - 109 mmol/L 106 105 103  CO2 22 - 29 mmol/L _0 Calcium 8.4 - 10.4 mg/dL 9.8 10.0 9.7  Total Protein 6.4 - 8.3 g/dL 7.0 - 7.1  Total Bilirubin 0.2 - 1.2 mg/dL 0.3 - 0.3  Alkaline Phos 40 - 150 U/L 86 - 68  AST 5 - 34 U/L 28 - 25  ALT 0 - 55 U/L 30 - 19    PATHOLOGY  Diagnosis 12/14/16 1. Breast, lumpectomy, Left w/seed - INVASIVE DUCTAL CARCINOMA, 1 CM. - ATYPICAL LOBULAR HYPERPLASIA ([place]). - PREVIOUS BIOPSY SITE. - INVASIVE CARCINOMA 0.2 CM FROM LATERAL MARGIN. - ATYPICA LOBULAR HYPERPLASIA LESS THAN 0.1 CM FROM LATERAL MARGIN. 2. Breast, lumpectomy, Right w/seed - INVASIVE DUCTAL CARCINOMA, 1.1 CM. - FIBROCYSTIC CHANGES. - PREVIOUS BIOPSY SITE. - INVASIVE CARCINOMA 1.1 CM FROM POSTERIOR MARGIN. 3. Lymph node, sentinel, biopsy, Right Axillary Contents - BENIGN FIBROADIPOSE TISSUE. - NO LYMPH NODE TISSUE OR MALIGNANCY. 4. Lymph node, sentinel, biopsy, Left Axillary - ONE BENIGN LYMPH NODE (0/1). Microscopic Comment 1. BREAST, INVASIVE TUMOR Procedure: Localized lumpectomy and one sentinel lymph node. Laterality: Left breast. Tumor Size: 1 cm. Histologic Type: Ductal. Grade: I. Tubular Differentiation: 1. Nuclear Pleomorphism: 2. Mitotic Count: 1. Ductal Carcinoma in Situ (DCIS): N/A. Extent of Tumor: Skin: N/A. Nipple: N/A. Skeletal muscle: N/A. 1 of 4 FINAL for Branham, Lucynda G ([id number]) Microscopic Comment(continued) Margins: Free of tumor. Invasive carcinoma, distance from closest margin: 0.3 cm from lateral margin. DCIS,  distance from closest margin: N/A. Regional Lymph Nodes: Number of Lymph Nodes Examined: 1. Number of Sentinel Lymph Nodes Examined: 1. Lymph Nodes with Macrometastases: 0. Lymph Nodes with Micrometastases: 0. Lymph Nodes with Isolated Tumor Cells: 0. Breast Prognostic Profile: Case [id number]. Estrogen Receptor: 100%, positive, strong staining. Progesterone Receptor: 10%, positive, weak staining. Her2: Negative, ratio 1.54. Ki-67: 2%. Best tumor block for sendout testing: 1E. Pathologic Stage Classification (pTNM, AJCC 8th Edition): Primary Tumor (pT): pT1b. Regional Lymph Nodes (pN): pN0. Distant Metastases (pM): pMX. 2. BREAST, INVASIVE TUMOR Procedure: Localized lumpectomy. Laterality: Right breast. Tumor Size: 1.1 cm. Histologic Type: Ductal. Grade: I. Tubular Differentiation: 1. Nuclear Pleomorphism: 2. Mitotic Count:  1. Ductal Carcinoma in Situ (DCIS): N/A. Extent of Tumor: Skin: N/A. Nipple: N/A. Skeletal muscle: N/A. Margins: Free of tumor. Invasive carcinoma, distance from closest margin: 1.1 cm from posterior margin. DCIS, distance from closest margin: N/A. Regional Lymph Nodes: Number of Lymph Nodes Examined: 0. Number of Sentinel Lymph Nodes Examined: 0. Lymph Nodes with Macrometastases: N/A. Lymph Nodes with Micrometastases: N/A. Lymph Nodes with Isolated Tumor Cells: N/A. Breast Prognostic Profile: Case [id number]. Estrogen Receptor: 100%, positive, strong staining. Progesterone Receptor: 100%, positive, strong staining. Her2: Negative, ratio 1.28. Ki-67: 5%. Best tumor block for sendout testing: 18F. 2 of 4 FINAL for Giangrande, Makyiah G ([id number]) Microscopic Comment(continued) Pathologic Stage Classification (pTNM, AJCC 8th Edition): Primary Tumor (pT): pT1c. Regional Lymph Nodes (pN): pNX. Distant Metastases (pM): pMX.    Diagnosis 11/11/2016 Breast, left, needle core biopsy, upper outer - INVASIVE MAMMARY CARCINOMA. - ATYPICAL LOBULAR  HYPERPLASIA. - FIBROCYSTIC CHANGES AND PSEUDOANGIOMATOUS STROMAL HYPERPLASIA ([place]).  Diagnosis 10/10/2016 Breast, right, needle core biopsy, 11:30 o'clock - INVASIVE DUCTAL CARCINOMA, SEE COMMENT.  Estrogen Receptor: 100%, POSITIVE, STRONG STAINING INTENSITY Progesterone Receptor: 100%, POSITIVE, STRONG STAINING INTENSITY Proliferation Marker Ki67: 5% HER2 - NEGATIVE  Diagnosis 09/05/15 Skin , Left forearm - INVASIVE WELL DIFFERENTIATED SQUAMOUS CELL CARCINOMA, MEASURING 0.6 CM IN GREATEST DIMENSION. - SOLAR ELASTOSIS IS PRESENT. - MARGINS ARE NEGATIVE. - SEE ONCOLOGY TEMPLATE.    Oncotype 12/14/16    RADIOGRAPHIC STUDIES: I have personally reviewed the radiological images as listed and agreed with the findings in the report. No results found.   Bone Density Scan: 02/24/17 ASSESSMENT: The BMD measured at Femur Neck Left is 0.810 g/cm2 with a T-score of -1.6.  Diagnostic mammogram on 10/20/16  showed persistent distortion corresponds to a vaguely palpable irregular mass and is suspicious for malignancy.Targeted ultrasound is performed, showing an irregular hypoechoic mass with posterior acoustic shadowing in the 11:30 o'clock location of the right breast 4 cm from the nipple which measures 0.9 x 0.9 x0.8 cm. There is associated internal vascularity. Evaluation of the axilla is negative for adenopathy.   Bone Density Scan 10/28/14 ASSESSMENT: BMD as determined from Femur Neck Right is 0.818 g/cm2 with a T-Score of -1.6.   ASSESSMENT & PLAN: 77 y.o. postmenopausal woman, with past medical history of coronary artery disease, hypertension, diabetes, CKD, presented with screening discovered bilateral breast cancer.  1.  Bilateral breast ductal carcinoma, malignant neoplasm of upper inner quadrant of right breast, pT1cN0M0 and upper outer quadrant of left breast, pT1bN0M0, stage IA, ER+/PR+/HER2- - I discussed her mammogram, ultrasound, breast MRI, and 2 biopsy results with patient  in details.  -She underwent Bilateral Lumpectomy and bilateral SLNB by Dr. Lucia Gaskins on 12/14/16.  I reviewed her surgical pathology results with her in details, surgical margins were negative.  - Her Oncotype results returned low risk, recurrence score 16, which predicts 10-year of distant recurrence 10% with tamoxifen, she will not need chemotherapy.  -She started radiation with Dr. Lisbeth Renshaw on 01/17/17 and plan to complete on 02/11/17, tolerating well.  -Given the strong ER and PR positivity, I do recommend adjuvant aromatase inhibitor to reduce her risk of cancer recurrence,  The potential benefit and side effects, which includes but not limited to, hot flash, skin and vaginal dryness, metabolic changes ( increased blood glucose, cholesterol, weight, etc.), slightly in increased risk of cardiovascular disease, cataracts, muscular and joint discomfort, osteopenia and osteoporosis, etc, were discussed with her in great details. She is interested, and we'll start after she completes radiation. -I suggest exemestane  due to her arthritis. If her exemestane co-pay is too high we can try anastrozole next. She can start in 2 weeks. -Started Exemestane in 02/2017. She is doing well with no side effects other than dizziness. I do not thin her dizziness is related to exemestane, but if it persistent, I suggest her to stop exemestane for 2-4 weeks to see if her symptom resolve -F/[facility] in 1 month [facility] and with me in 4 months.   2. Genetics -Due to her family history of breast cancer, she was referred to genetics. she underwent genetic testing   -She is positive for variant of uncertain significance identified in [place]. Negative for other gene mutations.   3. HTN, DM, CAD -She'll continue medication and follow-up with her primary care physician -DM controlled   4. Arthritis  -We discussed her trying exemestane and if she cannot tolerate joint pain we can switch her to another AI.   5.  Osteopenia  -2016 bone density, right femur shows osteopenia  -DEXA from 02/24/17 results with a T score of -1.6  6. CKD, Stage III - Her Cr is 1.56 today (04/12/17), which is stable compared to other results -She will continue to follow up with [name]  7. Dizziness -This has been persistent daily for months. I suggested she stop Topamax, Neurontin, or Exemestane to see if stopping 1 of these medications can resolve her dizziness. She has decided to try stopping the Topamax. I suggested she reduce this to 25 mg at first for 1 week and 0 mg for 1 week. -Will monitor and she will continue to follow up with her neurologist, Dr. Leonie Man    PLAN Lab and f/u in 4 months Reduce topamax to 25 mg for 1 week and to 0 mg for 1 week   No orders of the defined types were placed in this encounter.   All questions were answered. The patient knows to call the clinic with any problems, questions or concerns. I spent 20 minutes counseling the patient face to face. The total time spent in the appointment was 25 minutes and more than 50% was on counseling.  This document serves as a record of services personally performed by Truitt Merle, MD. It was created on her behalf by Theresia Bough, a trained medical scribe. The creation of this record is based on the scribe's personal observations and the provider's statements to them.   I have reviewed the above documentation for accuracy and completeness, and I agree with the above.     Truitt Merle, MD 04/12/2017

## 2017-04-12 ENCOUNTER — Inpatient Hospital Stay: Payer: Medicare Other | Attending: Hematology

## 2017-04-12 ENCOUNTER — Inpatient Hospital Stay (HOSPITAL_BASED_OUTPATIENT_CLINIC_OR_DEPARTMENT_OTHER): Payer: Medicare Other | Admitting: Hematology

## 2017-04-12 ENCOUNTER — Telehealth: Payer: Self-pay | Admitting: Hematology

## 2017-04-12 VITALS — BP 187/89 | HR 73 | Temp 97.9°F | Resp 20 | Wt 178.0 lb

## 2017-04-12 DIAGNOSIS — Z170 Estrogen receptor positive status [ER+]: Secondary | ICD-10-CM | POA: Diagnosis not present

## 2017-04-12 DIAGNOSIS — Z90710 Acquired absence of both cervix and uterus: Secondary | ICD-10-CM

## 2017-04-12 DIAGNOSIS — E1140 Type 2 diabetes mellitus with diabetic neuropathy, unspecified: Secondary | ICD-10-CM | POA: Diagnosis not present

## 2017-04-12 DIAGNOSIS — Z90722 Acquired absence of ovaries, bilateral: Secondary | ICD-10-CM

## 2017-04-12 DIAGNOSIS — Z85820 Personal history of malignant melanoma of skin: Secondary | ICD-10-CM

## 2017-04-12 DIAGNOSIS — F419 Anxiety disorder, unspecified: Secondary | ICD-10-CM | POA: Insufficient documentation

## 2017-04-12 DIAGNOSIS — Z923 Personal history of irradiation: Secondary | ICD-10-CM

## 2017-04-12 DIAGNOSIS — K219 Gastro-esophageal reflux disease without esophagitis: Secondary | ICD-10-CM

## 2017-04-12 DIAGNOSIS — Z9049 Acquired absence of other specified parts of digestive tract: Secondary | ICD-10-CM

## 2017-04-12 DIAGNOSIS — Z800 Family history of malignant neoplasm of digestive organs: Secondary | ICD-10-CM | POA: Insufficient documentation

## 2017-04-12 DIAGNOSIS — E785 Hyperlipidemia, unspecified: Secondary | ICD-10-CM | POA: Insufficient documentation

## 2017-04-12 DIAGNOSIS — I2510 Atherosclerotic heart disease of native coronary artery without angina pectoris: Secondary | ICD-10-CM | POA: Diagnosis not present

## 2017-04-12 DIAGNOSIS — Z1501 Genetic susceptibility to malignant neoplasm of breast: Secondary | ICD-10-CM | POA: Insufficient documentation

## 2017-04-12 DIAGNOSIS — I509 Heart failure, unspecified: Secondary | ICD-10-CM | POA: Insufficient documentation

## 2017-04-12 DIAGNOSIS — M1990 Unspecified osteoarthritis, unspecified site: Secondary | ICD-10-CM | POA: Insufficient documentation

## 2017-04-12 DIAGNOSIS — Z85828 Personal history of other malignant neoplasm of skin: Secondary | ICD-10-CM | POA: Diagnosis not present

## 2017-04-12 DIAGNOSIS — F0390 Unspecified dementia without behavioral disturbance: Secondary | ICD-10-CM | POA: Insufficient documentation

## 2017-04-12 DIAGNOSIS — B029 Zoster without complications: Secondary | ICD-10-CM | POA: Diagnosis not present

## 2017-04-12 DIAGNOSIS — Z809 Family history of malignant neoplasm, unspecified: Secondary | ICD-10-CM | POA: Insufficient documentation

## 2017-04-12 DIAGNOSIS — Z79899 Other long term (current) drug therapy: Secondary | ICD-10-CM | POA: Insufficient documentation

## 2017-04-12 DIAGNOSIS — Z79811 Long term (current) use of aromatase inhibitors: Secondary | ICD-10-CM

## 2017-04-12 DIAGNOSIS — C50211 Malignant neoplasm of upper-inner quadrant of right female breast: Secondary | ICD-10-CM

## 2017-04-12 DIAGNOSIS — N182 Chronic kidney disease, stage 2 (mild): Secondary | ICD-10-CM | POA: Insufficient documentation

## 2017-04-12 DIAGNOSIS — Z803 Family history of malignant neoplasm of breast: Secondary | ICD-10-CM | POA: Insufficient documentation

## 2017-04-12 DIAGNOSIS — Z8673 Personal history of transient ischemic attack (TIA), and cerebral infarction without residual deficits: Secondary | ICD-10-CM | POA: Diagnosis not present

## 2017-04-12 DIAGNOSIS — C50412 Malignant neoplasm of upper-outer quadrant of left female breast: Secondary | ICD-10-CM | POA: Insufficient documentation

## 2017-04-12 DIAGNOSIS — M8580 Other specified disorders of bone density and structure, unspecified site: Secondary | ICD-10-CM | POA: Diagnosis not present

## 2017-04-12 DIAGNOSIS — I129 Hypertensive chronic kidney disease with stage 1 through stage 4 chronic kidney disease, or unspecified chronic kidney disease: Secondary | ICD-10-CM | POA: Insufficient documentation

## 2017-04-12 LAB — CMP (CANCER CENTER ONLY)
ALT: 30 U/L (ref 0–55)
AST: 28 U/L (ref 5–34)
Albumin: 3.7 g/dL (ref 3.5–5.0)
Alkaline Phosphatase: 86 U/L (ref 40–150)
Anion gap: 10 (ref 3–11)
BUN: 15 mg/dL (ref 7–26)
CO2: 25 mmol/L (ref 22–29)
Calcium: 9.8 mg/dL (ref 8.4–10.4)
Chloride: 106 mmol/L (ref 98–109)
Creatinine: 1.56 mg/dL — ABNORMAL HIGH (ref 0.60–1.10)
GFR, Est AFR Am: 36 mL/min — ABNORMAL LOW (ref >60)
GFR, Estimated: 31 mL/min — ABNORMAL LOW (ref >60)
Glucose, Bld: 111 mg/dL (ref 70–140)
Potassium: 3.6 mmol/L (ref 3.3–4.7)
Sodium: 141 mmol/L (ref 136–145)
Total Bilirubin: 0.3 mg/dL (ref 0.2–1.2)
Total Protein: 7 g/dL (ref 6.4–8.3)

## 2017-04-12 LAB — CBC WITH DIFFERENTIAL (CANCER CENTER ONLY)
Basophils Absolute: 0 10*3/uL (ref 0.0–0.1)
Basophils Relative: 1 %
Eosinophils Absolute: 0.2 10*3/uL (ref 0.0–0.5)
Eosinophils Relative: 2 %
HCT: 38 % (ref 34.8–46.6)
Hemoglobin: 12.4 g/dL (ref 11.6–15.9)
Lymphocytes Relative: 17 %
Lymphs Abs: 1.1 10*3/uL (ref 0.9–3.3)
MCH: 30.2 pg (ref 25.1–34.0)
MCHC: 32.7 g/dL (ref 31.5–36.0)
MCV: 92.4 fL (ref 79.5–101.0)
Monocytes Absolute: 0.4 10*3/uL (ref 0.1–0.9)
Monocytes Relative: 6 %
Neutro Abs: 4.7 10*3/uL (ref 1.5–6.5)
Neutrophils Relative %: 74 %
Platelet Count: 193 10*3/uL (ref 145–400)
RBC: 4.11 MIL/uL (ref 3.70–5.45)
RDW: 15.5 % (ref 11.2–16.1)
WBC Count: 6.3 10*3/uL (ref 3.9–10.3)

## 2017-04-12 NOTE — Telephone Encounter (Signed)
Gave avs and calendar for may  °

## 2017-04-14 ENCOUNTER — Other Ambulatory Visit: Payer: Medicare Other

## 2017-04-14 ENCOUNTER — Encounter: Payer: Self-pay | Admitting: Hematology

## 2017-04-14 ENCOUNTER — Ambulatory Visit: Payer: Medicare Other | Admitting: Hematology

## 2017-04-14 DIAGNOSIS — N312 Flaccid neuropathic bladder, not elsewhere classified: Secondary | ICD-10-CM | POA: Diagnosis not present

## 2017-04-18 ENCOUNTER — Other Ambulatory Visit: Payer: Self-pay | Admitting: Neurology

## 2017-04-22 ENCOUNTER — Encounter (HOSPITAL_COMMUNITY): Payer: Self-pay

## 2017-04-22 DIAGNOSIS — H1130 Conjunctival hemorrhage, unspecified eye: Secondary | ICD-10-CM | POA: Diagnosis not present

## 2017-04-22 DIAGNOSIS — I130 Hypertensive heart and chronic kidney disease with heart failure and stage 1 through stage 4 chronic kidney disease, or unspecified chronic kidney disease: Secondary | ICD-10-CM | POA: Diagnosis not present

## 2017-04-22 DIAGNOSIS — E1149 Type 2 diabetes mellitus with other diabetic neurological complication: Secondary | ICD-10-CM | POA: Insufficient documentation

## 2017-04-22 DIAGNOSIS — Z96652 Presence of left artificial knee joint: Secondary | ICD-10-CM | POA: Insufficient documentation

## 2017-04-22 DIAGNOSIS — Z85828 Personal history of other malignant neoplasm of skin: Secondary | ICD-10-CM | POA: Insufficient documentation

## 2017-04-22 DIAGNOSIS — N182 Chronic kidney disease, stage 2 (mild): Secondary | ICD-10-CM | POA: Diagnosis not present

## 2017-04-22 DIAGNOSIS — Z853 Personal history of malignant neoplasm of breast: Secondary | ICD-10-CM | POA: Insufficient documentation

## 2017-04-22 DIAGNOSIS — Z7902 Long term (current) use of antithrombotics/antiplatelets: Secondary | ICD-10-CM | POA: Insufficient documentation

## 2017-04-22 DIAGNOSIS — Z79899 Other long term (current) drug therapy: Secondary | ICD-10-CM | POA: Insufficient documentation

## 2017-04-22 DIAGNOSIS — E1122 Type 2 diabetes mellitus with diabetic chronic kidney disease: Secondary | ICD-10-CM | POA: Insufficient documentation

## 2017-04-22 DIAGNOSIS — R51 Headache: Secondary | ICD-10-CM | POA: Diagnosis not present

## 2017-04-22 DIAGNOSIS — I10 Essential (primary) hypertension: Secondary | ICD-10-CM | POA: Diagnosis not present

## 2017-04-22 DIAGNOSIS — B309 Viral conjunctivitis, unspecified: Secondary | ICD-10-CM | POA: Diagnosis not present

## 2017-04-22 DIAGNOSIS — Z96632 Presence of left artificial wrist joint: Secondary | ICD-10-CM | POA: Insufficient documentation

## 2017-04-22 DIAGNOSIS — I509 Heart failure, unspecified: Secondary | ICD-10-CM | POA: Insufficient documentation

## 2017-04-22 DIAGNOSIS — H579 Unspecified disorder of eye and adnexa: Secondary | ICD-10-CM | POA: Diagnosis not present

## 2017-04-22 DIAGNOSIS — I2510 Atherosclerotic heart disease of native coronary artery without angina pectoris: Secondary | ICD-10-CM | POA: Insufficient documentation

## 2017-04-22 MED ORDER — FLUORESCEIN SODIUM 1 MG OP STRP
1.0000 | ORAL_STRIP | Freq: Once | OPHTHALMIC | Status: DC
  Filled 2017-04-22: qty 1

## 2017-04-22 MED ORDER — TETRACAINE HCL 0.5 % OP SOLN
2.0000 [drp] | Freq: Once | OPHTHALMIC | Status: AC
  Administered 2017-04-23: 2 [drp] via OPHTHALMIC
  Filled 2017-04-22: qty 4

## 2017-04-22 NOTE — ED Triage Notes (Signed)
Pt states that this afternoon her L eye became really red and painful, states blood was coming from her eye, headache and high BP.

## 2017-04-23 ENCOUNTER — Emergency Department (HOSPITAL_COMMUNITY): Payer: Medicare Other

## 2017-04-23 ENCOUNTER — Telehealth: Payer: Self-pay | Admitting: Neurology

## 2017-04-23 ENCOUNTER — Emergency Department (HOSPITAL_COMMUNITY)
Admission: EM | Admit: 2017-04-23 | Discharge: 2017-04-23 | Disposition: A | Discharge status: Home / Self Care | Payer: Medicare Other | Attending: Emergency Medicine | Admitting: Emergency Medicine

## 2017-04-23 DIAGNOSIS — B309 Viral conjunctivitis, unspecified: Secondary | ICD-10-CM

## 2017-04-23 DIAGNOSIS — R519 Headache, unspecified: Secondary | ICD-10-CM

## 2017-04-23 DIAGNOSIS — R51 Headache: Secondary | ICD-10-CM

## 2017-04-23 MED ORDER — HYDROCODONE-ACETAMINOPHEN 5-325 MG PO TABS
1.0000 | ORAL_TABLET | Freq: Once | ORAL | Status: AC
  Administered 2017-04-23: 1 via ORAL
  Filled 2017-04-23: qty 1

## 2017-04-23 NOTE — ED Notes (Signed)
ED Provider at bedside. 

## 2017-04-23 NOTE — ED Notes (Signed)
Visual acuity performed Pt did not have her glasses.

## 2017-04-23 NOTE — Telephone Encounter (Signed)
I called and left a message.   [name] called back. The patient is in the [facility] ER, She began having bleeding from the left eye. She has had an elevation of the blood pressure and some pain behind the eye.  They are in the ER, I told them to stay there for an evaluation.

## 2017-04-24 NOTE — ED Provider Notes (Signed)
[facility] EMERGENCY DEPARTMENT Provider Note   CSN: [id number] Arrival date & time: 04/22/17  1954     History   Chief Complaint Chief Complaint  Patient presents with  . Eye Problem  . Hypertension    HPI [name] is a 77 y.o. female.  HPI Patient is a 77 year old female on Plavix who presents to the emergency department with left eye redness and bleeding from her left eye.  No injury or trauma.  She reports some headache at this time.  No crusting of her left eyelids.  No recent sick contacts.  Symptoms are mild to moderate in severity.  No weakness of her arms or legs.  Reports mild left-sided headache at this time.  No significant change in her vision.  No fevers or chills.   Past Medical History:  Diagnosis Date  . Anxiety   . Arthritis     osteoarthritis  . Breast cancer in female [facility])    Bilateral  . CAD S/P percutaneous coronary angioplasty 2007; March 2011   [employer] 3.0 mm 20 mm postdilated 3.6 mm in early mid LAD; status post ISR Cutting Balloon PTCA and March 11 along with PCI of distal mid lesion with a 3.0 mm 12 mm MultiLink vision BMS; the proximal stent causes jailing of SP1 and SP2 with ostial 70-80% lesions  . Cancer (HCC)    Melanoma, Squamous cell Carcinoma  . CHF (congestive heart failure) ([place])   . Chronic kidney disease    stage 2   . Dementia    mild  . Diabetes mellitus type 2 with neurological manifestations ([place])   . Dyslipidemia, goal LDL below 70   . Full dentures   . Genetic testing 12/03/2016   Germline genetic testing was performed through [employer]'s Common Hereditary Cancers Panel + [employer]'s Melanoma Panel. This custom panel includes analysis of the following 51 genes: APC, ATM, AXIN2, BAP1, BARD1, BMPR1A, BRCA1, BRCA2, BRIP1, CDH1, CDK4, CDKN2A, CHEK2, CTNNA1, DICER1, EPCAM, GREM1, HOXB13, KIT, MEN1, MITF, MLH1, MSH2, MSH3, MSH6, MUTYH, NBN, NF1, NTHL1, PALB2, PDGFRA, PMS2, POLD1, POL  . GERD (gastroesophageal  reflux disease)   . Headache(784.0)   . History of hematuria    Followed by Dr. Gaynelle Arabian  . Hypertension, essential, benign   . Nausea & vomiting 10/2015  . Peripheral neuropathy   . Stroke ([place])   . TIA (transient ischemic attack)    multiple in the past  . Wears glasses     Patient Active Problem List   Diagnosis Date Noted  . Genetic testing 12/03/2016  . 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 Malignant neoplasm of upper-outer quadrant of left breast in female, estrogen receptor positive ([place]) 11/19/2016  . Chronic idiopathic gout involving toe of left foot without tophus 11/15/2016  . Malignant 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, estrogen receptor positive ([place]) 11/09/2016  . Tendon tear, ankle, left, sequela   . Traumatic rupture of left anterior tibial tendon 04/20/2016  . Acute on chronic kidney failure ([place])   . 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 ([place])   . Headache, migraine   . Unexplained weight loss 11/17/2015  . Acute kidney injury superimposed on chronic kidney disease ([place]) 11/17/2015  . Restless legs syndrome 07/24/2015  . Mild cognitive impairment 01/08/2015  . Abnormality of gait 01/08/2015  . Dizziness 12/19/2014  . Preoperative cardiovascular examination 12/11/2013  . GERD (gastroesophageal reflux disease) 10/06/2013  . Syncope 10/05/2013  . Dementia 12/06/2012  . Chest  pain at rest 10/28/2012  . Anxiety 10/28/2012    Class: Acute  . Edema of both legs 10/28/2012  . CAD S/P percutaneous coronary angioplasty   . Essential hypertension   . Dyslipidemia, goal LDL below 70     Past Surgical History:  Procedure Laterality Date  . ABDOMINAL HYSTERECTOMY    . APPENDECTOMY    . BLADDER SUSPENSION    . BREAST LUMPECTOMY WITH RADIOACTIVE SEED AND SENTINEL LYMPH NODE BIOPSY Bilateral 12/14/2016   Procedure: BILATERAL BREAST LUMPECTOMY WITH BILATERAL  RADIOACTIVE SEED AND BILATERAL AXILLARY  SENTINEL LYMPH NODE  BIOPSY ERAS PATHWAY;  Surgeon: Alphonsa Overall, MD;  Location: [facility];  Service: General;  Laterality: Bilateral;  PECTORAL BLOCK  . CARDIAC CATHETERIZATION  02/24/2006   85% stenosis in the proximal portion of LAD-arrangements made for PCI on 02/25/2006  . CARDIAC CATHETERIZATION  02/25/2006   80% LAD lesion stented with a 3x12m Liberte stent resulting in reduction of 80% lesion to 0% residual  . CARDIAC CATHETERIZATION  04/26/2006   Medical management  . CARDIAC CATHETERIZATION  06/24/2009   60-70% re-stenosis in the proximal LAD. A 3.25x15 cutting balloon, 3 inflations - 14atm-38sec, 13atm-39sec, and 12atm-40sec reduced to less than 10%. 60% stenosis of the mid/distal LAD stented with a 3x160mMultilink stent.  . Marland KitchenARDIAC CATHETERIZATION  07/09/2009   Medical management  . CAROTID DOPPLER  06/24/2009   40-59% R ICA stenosis. No significant ICA stenosis noted  . CHOLECYSTECTOMY    . DILATION AND CURETTAGE OF UTERUS    . JOINT REPLACEMENT Left   . LESION REMOVAL Right 03/11/2015   Procedure:  EXCISION OF RIGHT PRE TIBIAL LESION;  Surgeon: JaJudeth HornMD;  [place] Service: General;  Laterality: Right;  . MASS EXCISION Left 06/10/2014   Procedure: EXCISION LEFT LOWER LEG LESION;  Surgeon: JaDoreen SalvageMD;  [place] Service: General;  Laterality: Left;  . Marland KitchenASS EXCISION Left 09/05/2015   Procedure: EXCISION OF LEFT FOREARM  MASS;  Surgeon: JaJudeth HornMD;  [place] [facility] Service: General;  Laterality: Left;  . NM MYOVIEW LTD  11/16/2011   5 beats a PVC during recovery.  No skin or infarction.  Reached 5 METs, EKG negative for ischemia   . SHOULDER ARTHROSCOPY  10/15  . SHOULDER ARTHROSCOPY  1995   left  . TENDON REPAIR Left 05/12/2016   Procedure: Left Anterior Tibial Tendon Reconstruction;  Surgeon: MaNewt MinionMD;  [place] [facility] Service: Orthopedics;  Laterality: Left;  . TOTAL KNEE ARTHROPLASTY  2005   left  .  TRANSTHORACIC ECHOCARDIOGRAM  10/2015   EF 55-60 %. Mild LVH. Mild pulmonary hypertension. Normal valves.  . TRANSTHORACIC ECHOCARDIOGRAM  12/31/2009/July 2015   IsLV size & function, Gr 1 DD w/ mild TR.;; b)09/2013: 55-60%. Grade 1 DD, mild aortic sclerosis  . WRIST ARTHROPLASTY  2010   cancer lt wrist    OB History    No data available       Home Medications    Prior to Admission medications   Medication Sig Start Date End Date Taking? Authorizing Provider  allopurinol (ZYLOPRIM) 100 MG tablet Take 1 tablet (100 mg total) by mouth daily. 11/15/16  Yes DuNewt MinionMD  Biotin w/ Vitamins C & E (HAIR SKIN & NAILS GUMMIES PO) Take 2 tablets by mouth daily.    Yes [provider]  bumetanide (BUMEX) 1 MG tablet Take 2 mg by  mouth daily.  08/26/16  Yes [provider]  busPIRone (BUSPAR) 7.5 MG tablet Take 7.5 mg by mouth at bedtime.  11/05/16  Yes [provider]  Calcium Carb-Cholecalciferol (CALCIUM 600 + D PO) Take 1 tablet by mouth at bedtime.    Yes [provider]  Cholecalciferol 2000 units CAPS Take 2,000 Units by mouth daily.   Yes [provider]  clopidogrel (PLAVIX) 75 MG tablet Take 1 tablet (75 mg total) by mouth daily. 07/23/16  Yes Leonie Man, MD  COD LIVER OIL PO Take 415 mg by mouth daily.   Yes [provider]  CRANBERRY PO Take 1 capsule by mouth daily.   Yes [provider]  diphenhydrAMINE (BENADRYL) 50 MG capsule Take 50 mg by mouth at bedtime.   Yes [provider]  exemestane (AROMASIN) 25 MG tablet Take 1 tablet (25 mg total) daily after breakfast by mouth. 02/07/17  Yes Truitt Merle, MD  Fish Oil-Krill Oil (KRILL OIL PLUS PO) Take 1 capsule by mouth daily.    Yes [provider]  gabapentin (NEURONTIN) 300 MG capsule Take 600 mg by mouth at bedtime.    Yes [provider]  Iron-FA-B Cmp-C-Biot-Probiotic (FUSION PLUS) CAPS takes 1 tablet daily 01/08/16  Yes [provider]  losartan (COZAAR) 100 MG tablet take 1 tablet daily 12/16/15  Yes [provider]  Melatonin 5 MG CAPS Take 5 mg by mouth at bedtime.   Yes [provider]  Methylfol-Methylcob-Acetylcyst (CEREFOLIN NAC) 6-2-600 MG TABS TAKE 1 CAPLET BY MOUTH ONCE DAILY. 07/15/16  Yes Garvin Fila, MD  metoprolol succinate (TOPROL XL) 50 MG 24 hr tablet Take 0.5 tablet (25 mg total) by mouth daily. 07/20/16  Yes Leonie Man, MD  nitroGLYCERIN (NITROLINGUAL) 0.4 MG/SPRAY spray Place 1 spray under the tongue every 5 (five) minutes x 3 doses as needed for chest pain.    Yes Leonie Man, MD  pantoprazole (PROTONIX) 40 MG tablet TAKE 1 TABLET DAILY (CONTACT OFFICE FOR ADDITIONAL REFILLS, FINAL ATTEMPT) 02/02/16  Yes Leonie Man, MD  pramipexole (MIRAPEX) 0.25 MG tablet TAKE 1 TABLET AT 4:00 P.M. AND 2 TABLETS (0.5 MG) AT BEDTIME 04/18/17  Yes Garvin Fila, MD  Resveratrol (RESERVAPAK PO) Take 1 tablet by mouth every evening.    Yes [provider]  rosuvastatin (CRESTOR) 20 MG tablet Take 20 mg by mouth at bedtime.   Yes [provider]  topiramate (TOPAMAX) 50 MG tablet Take 0.5 tablets (25 mg total) by mouth 2 (two) times daily. Patient taking differently: Take 50 mg by mouth 2 (two) times daily.  03/03/17  Yes Garvin Fila, MD  colchicine 0.6 MG tablet Take one tablet daily during acute gout attack for 3 days, discontinue after this. Resume in the event of another acute gout flare up. Patient not taking: Reported on 04/12/2017 11/15/16   Newt Minion, MD  HYDROcodone-acetaminophen (NORCO/VICODIN) 5-325 MG tablet Take 1-2 tablets by mouth every 6 (six) hours as needed for moderate pain. Patient not taking: Reported on 03/21/2017 12/14/16   Alphonsa Overall, MD    Family History Family History  Problem Relation Age of Onset  . Diabetes Mother   . Hypertension Mother   . Kidney disease Mother   . Hypertension Father   . Emphysema Father   . Heart  attack Father   . Heart disease Father   . Arthritis Sister   . Diabetes Sister   . Hypertension Sister   .  Breast cancer Sister 67       treated with mastectomy  . Cervical cancer Sister 78  . Hypertension Brother   . Hyperlipidemia Brother   . Diabetes Brother   . Stroke Brother   . Diabetes Sister   . Hypertension Sister   . Stomach cancer Sister 23  . Hypertension Brother   . ALS Brother        d.62s  . Breast cancer Daughter 80       triple negative treated with lumpectomy, chemo, radiation  . Heart attack Daughter 13       d.45  . COPD Daughter   . Cancer Paternal Aunt        unspecified type  . Cancer Paternal Uncle        unspecified type  . Cancer Paternal Uncle        unspecified type    Social History Social History   Tobacco Use  . Smoking status: Never Smoker  . Smokeless tobacco: Never Used  Substance Use Topics  . Alcohol use: No  . Drug use: No     Allergies   Lipitor [atorvastatin]; Lyrica [pregabalin]; Nsaids; Reglan [metoclopramide]; and Ultram [tramadol]   Review of Systems Review of Systems  All other systems reviewed and are negative.    Physical Exam Updated Vital Signs BP 138/72   Pulse 92   Temp 97.8 F (36.6 C)   Resp 16   Ht 5' 7" (1.702 m)   Wt 80.7 kg (178 lb)   SpO2 99%   BMI 27.88 kg/m   Physical Exam  Constitutional: She is oriented to person, place, and time. She appears well-developed and well-nourished. No distress.  HENT:  Head: Normocephalic and atraumatic.  Eyes: EOM are normal. Pupils are equal, round, and reactive to light.  Complete injection of the left conjunctival region without active bleeding.  Area appears consistent with a hemorrhagic conjunctivitis.  Neck: Normal range of motion.  Cardiovascular: Normal rate, regular rhythm and normal heart sounds.  Pulmonary/Chest: Effort normal and breath sounds normal.  Abdominal: Soft. She exhibits no distension. There is no tenderness.  Musculoskeletal:  Normal range of motion.  Neurological: She is alert and oriented to person, place, and time.  5/5 strength in major muscle groups of  bilateral upper and lower extremities. Speech normal. No facial asymetry.   Skin: Skin is warm and dry.  Psychiatric: She has a normal mood and affect. Judgment normal.  Nursing note and vitals reviewed.    ED Treatments / Results  Labs (all labs ordered are listed, but only abnormal results are displayed) Labs Reviewed - No data to display  EKG  EKG Interpretation None       Radiology Ct Head Wo Contrast  Result Date: 04/23/2017 CLINICAL DATA:  Initial evaluation for acute severe headache, pain and redness at left eye. EXAM: CT HEAD AND ORBITS WITHOUT CONTRAST TECHNIQUE: Contiguous axial images were obtained from the base of the skull through the vertex without contrast. Multidetector CT imaging of the orbits was performed using the standard protocol without intravenous contrast. COMPARISON:  Prior CT from 06/28/2016. FINDINGS: CT HEAD FINDINGS Brain: Generalized age-related cerebral atrophy. Scattered patchy hypodensity within the periventricular and deep white matter both cerebral hemispheres, nonspecific, but most like related chronic small vessel ischemic disease. Changes are mild to moderate in nature. No acute intracranial hemorrhage. No acute large vessel territory infarct. No mass lesion, midline shift or mass effect. No hydrocephalus. No extra-axial fluid collection. Vascular: No  asymmetric hyperdense vessel. Calcified atherosclerosis noted at the skull base. Skull: Scalp soft tissues and calvarium within normal limits. Other: Paranasal sinuses are clear. CT ORBITS FINDINGS Orbits: Globes symmetric in size and appearance with normal morphology bilaterally. Patient status post cataract extraction bilaterally. Optic nerves symmetric and normal. Extra-ocular muscles within normal limits. Intraconal and extraconal fat well-maintained. Superior orbital  veins symmetric and normal. No abnormality at the orbital apices. Cavernous sinus grossly unremarkable. Lacrimal glands normal. No acute abnormality identified. Visualized sinuses: Clear. Soft tissues: Periorbital soft tissues within normal limits. IMPRESSION: CT BRAIN IMPRESSION: 1. No acute intracranial abnormality. 2. Age-related cerebral atrophy with mild to moderate chronic small vessel ischemic disease. CT ORBIT IMPRESSION: 1. No acute abnormality about the globes or orbital soft tissues. 2. Sequelae of prior bilateral cataract extraction. Electronically Signed   By: Jeannine Boga M.D.   On: 04/23/2017 04:12   Ct Orbits Wo Contrast  Result Date: 04/23/2017 CLINICAL DATA:  Initial evaluation for acute severe headache, pain and redness at left eye. EXAM: CT HEAD AND ORBITS WITHOUT CONTRAST TECHNIQUE: Contiguous axial images were obtained from the base of the skull through the vertex without contrast. Multidetector CT imaging of the orbits was performed using the standard protocol without intravenous contrast. COMPARISON:  Prior CT from 06/28/2016. FINDINGS: CT HEAD FINDINGS Brain: Generalized age-related cerebral atrophy. Scattered patchy hypodensity within the periventricular and deep white matter both cerebral hemispheres, nonspecific, but most like related chronic small vessel ischemic disease. Changes are mild to moderate in nature. No acute intracranial hemorrhage. No acute large vessel territory infarct. No mass lesion, midline shift or mass effect. No hydrocephalus. No extra-axial fluid collection. Vascular: No asymmetric hyperdense vessel. Calcified atherosclerosis noted at the skull base. Skull: Scalp soft tissues and calvarium within normal limits. Other: Paranasal sinuses are clear. CT ORBITS FINDINGS Orbits: Globes symmetric in size and appearance with normal morphology bilaterally. Patient status post cataract extraction bilaterally. Optic nerves symmetric and normal. Extra-ocular muscles  within normal limits. Intraconal and extraconal fat well-maintained. Superior orbital veins symmetric and normal. No abnormality at the orbital apices. Cavernous sinus grossly unremarkable. Lacrimal glands normal. No acute abnormality identified. Visualized sinuses: Clear. Soft tissues: Periorbital soft tissues within normal limits. IMPRESSION: CT BRAIN IMPRESSION: 1. No acute intracranial abnormality. 2. Age-related cerebral atrophy with mild to moderate chronic small vessel ischemic disease. CT ORBIT IMPRESSION: 1. No acute abnormality about the globes or orbital soft tissues. 2. Sequelae of prior bilateral cataract extraction. Electronically Signed   By: Jeannine Boga M.D.   On: 04/23/2017 04:12    Procedures Procedures (including critical care time)  Medications Ordered in ED Medications  tetracaine (PONTOCAINE) 0.5 % ophthalmic solution 2 drop (2 drops Left Eye Given by Other 04/23/17 0255)  HYDROcodone-acetaminophen (NORCO/VICODIN) 5-325 MG per tablet 1 tablet (1 tablet Oral Given 04/23/17 0601)     Initial Impression / Assessment and Plan / ED Course  I have reviewed the triage vital signs and the nursing notes.  Pertinent labs & imaging results that were available during my care of the patient were reviewed by me and considered in my medical decision making (see chart for details).     CT imaging without abnormality.  Headache and symptoms have improved.  Area appears consistent with hemorrhagic conjunctivitis.  Primary care follow-up.  Patient understands return the ER for new or worsening symptoms.  Final Clinical Impressions(s) / ED Diagnoses   Final diagnoses:  Acute nonintractable headache, unspecified headache type  Acute viral conjunctivitis of left  eye    ED Discharge Orders    None       Jola Schmidt, MD 04/24/17 [phone]

## 2017-04-26 DIAGNOSIS — N2581 Secondary hyperparathyroidism of renal origin: Secondary | ICD-10-CM | POA: Diagnosis not present

## 2017-04-26 DIAGNOSIS — I129 Hypertensive chronic kidney disease with stage 1 through stage 4 chronic kidney disease, or unspecified chronic kidney disease: Secondary | ICD-10-CM | POA: Diagnosis not present

## 2017-04-26 DIAGNOSIS — N183 Chronic kidney disease, stage 3 (moderate): Secondary | ICD-10-CM | POA: Diagnosis not present

## 2017-04-26 DIAGNOSIS — D631 Anemia in chronic kidney disease: Secondary | ICD-10-CM | POA: Diagnosis not present

## 2017-04-28 DIAGNOSIS — Z6828 Body mass index (BMI) 28.0-28.9, adult: Secondary | ICD-10-CM | POA: Diagnosis not present

## 2017-04-28 DIAGNOSIS — Z1389 Encounter for screening for other disorder: Secondary | ICD-10-CM | POA: Diagnosis not present

## 2017-04-28 DIAGNOSIS — E663 Overweight: Secondary | ICD-10-CM | POA: Diagnosis not present

## 2017-04-28 DIAGNOSIS — J069 Acute upper respiratory infection, unspecified: Secondary | ICD-10-CM | POA: Diagnosis not present

## 2017-05-04 ENCOUNTER — Telehealth: Payer: Self-pay

## 2017-05-04 DIAGNOSIS — J42 Unspecified chronic bronchitis: Secondary | ICD-10-CM | POA: Diagnosis not present

## 2017-05-04 DIAGNOSIS — E1140 Type 2 diabetes mellitus with diabetic neuropathy, unspecified: Secondary | ICD-10-CM | POA: Diagnosis not present

## 2017-05-04 DIAGNOSIS — G4700 Insomnia, unspecified: Secondary | ICD-10-CM | POA: Diagnosis not present

## 2017-05-04 DIAGNOSIS — R946 Abnormal results of thyroid function studies: Secondary | ICD-10-CM | POA: Diagnosis not present

## 2017-05-04 DIAGNOSIS — Z6828 Body mass index (BMI) 28.0-28.9, adult: Secondary | ICD-10-CM | POA: Diagnosis not present

## 2017-05-04 DIAGNOSIS — Z0001 Encounter for general adult medical examination with abnormal findings: Secondary | ICD-10-CM | POA: Diagnosis not present

## 2017-05-04 DIAGNOSIS — L97525 Non-pressure chronic ulcer of other part of left foot with muscle involvement without evidence of necrosis: Secondary | ICD-10-CM | POA: Diagnosis not present

## 2017-05-04 DIAGNOSIS — E785 Hyperlipidemia, unspecified: Secondary | ICD-10-CM | POA: Diagnosis not present

## 2017-05-04 DIAGNOSIS — R201 Hypoesthesia of skin: Secondary | ICD-10-CM | POA: Diagnosis not present

## 2017-05-04 DIAGNOSIS — L03116 Cellulitis of left lower limb: Secondary | ICD-10-CM | POA: Diagnosis not present

## 2017-05-04 DIAGNOSIS — L84 Corns and callosities: Secondary | ICD-10-CM | POA: Diagnosis not present

## 2017-05-04 DIAGNOSIS — M1990 Unspecified osteoarthritis, unspecified site: Secondary | ICD-10-CM | POA: Diagnosis not present

## 2017-05-04 DIAGNOSIS — Z1389 Encounter for screening for other disorder: Secondary | ICD-10-CM | POA: Diagnosis not present

## 2017-05-04 NOTE — Telephone Encounter (Signed)
Left VM for patient and  confirmed appt with Wilber Bihari, NP for cancer survivorship care plan visit.  Cyndia Bent RN

## 2017-05-06 ENCOUNTER — Other Ambulatory Visit: Payer: Self-pay | Admitting: Neurology

## 2017-05-08 DIAGNOSIS — S99921A Unspecified injury of right foot, initial encounter: Secondary | ICD-10-CM | POA: Diagnosis not present

## 2017-05-11 IMAGING — CT CT ABD-PELV W/O CM
2 of 4 series · 12 of 46 positions shown, 14 images · non-contrast
Comparison: 11/01/2011

CLINICAL DATA: Found on bathroom floor.  Abdominal pain.

EXAM:
CT ABDOMEN AND PELVIS WITHOUT CONTRAST
TECHNIQUE: Multidetector CT imaging of the abdomen and pelvis was performed
following the standard protocol without IV contrast.

[Series 201: routine, idose (2) · axial · 0.67mm/px · z∈[-420,-35]mm · 9 of 93 slices shown, 11 images]
[im 8/93  soft-tissue]
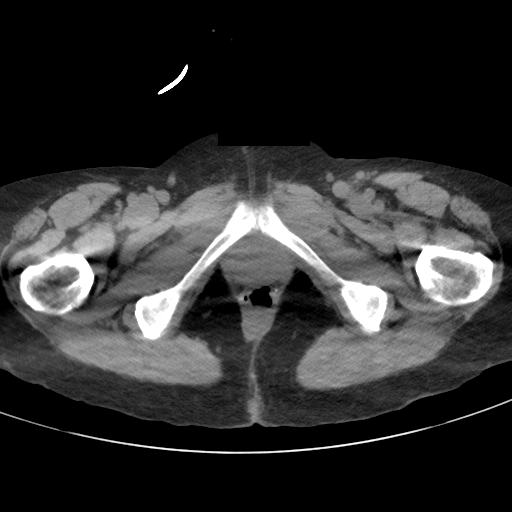
[im 8/93  bone]
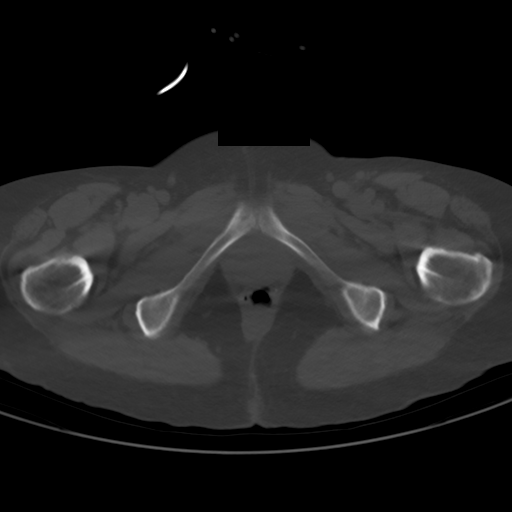
[im 19/93  soft-tissue]
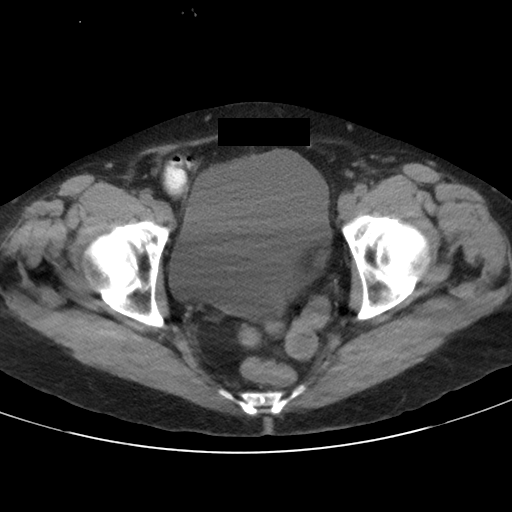
[im 26/93  soft-tissue]
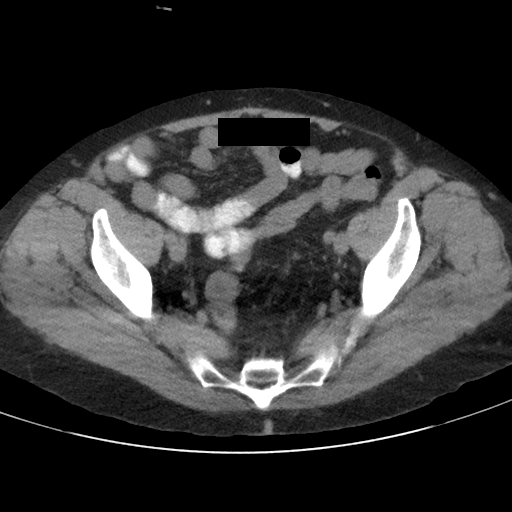
[im 37/93  soft-tissue]
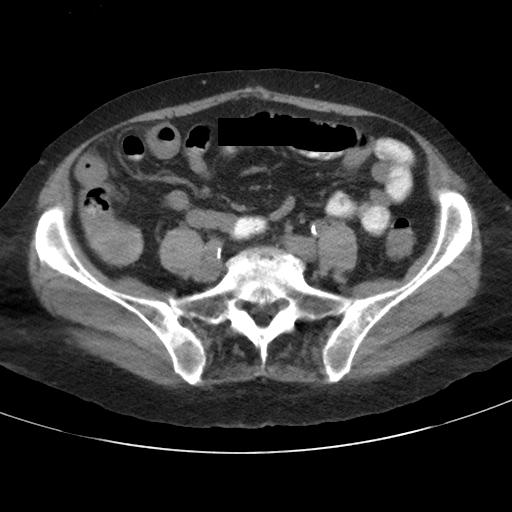
[im 48/93  soft-tissue]
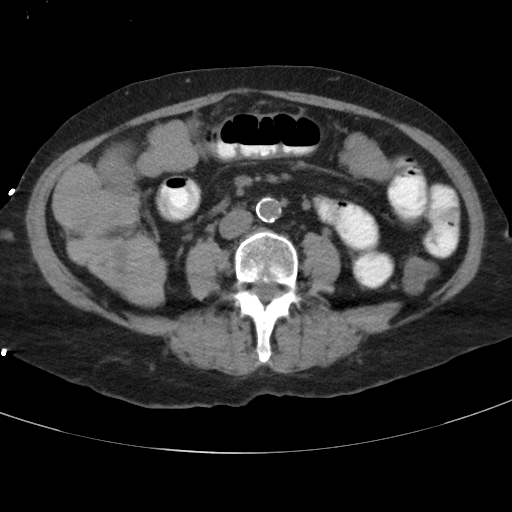
[im 56/93  soft-tissue]
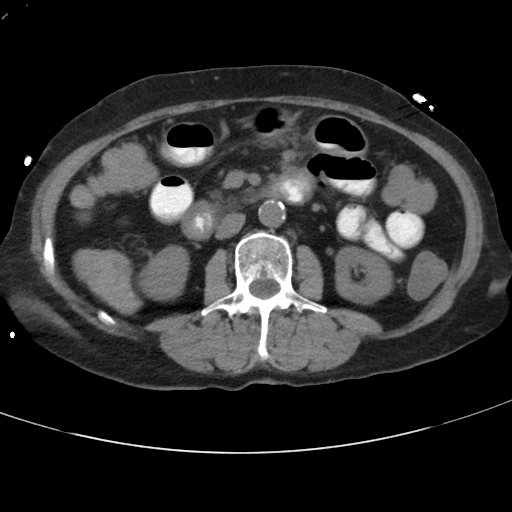
[im 67/93  soft-tissue]
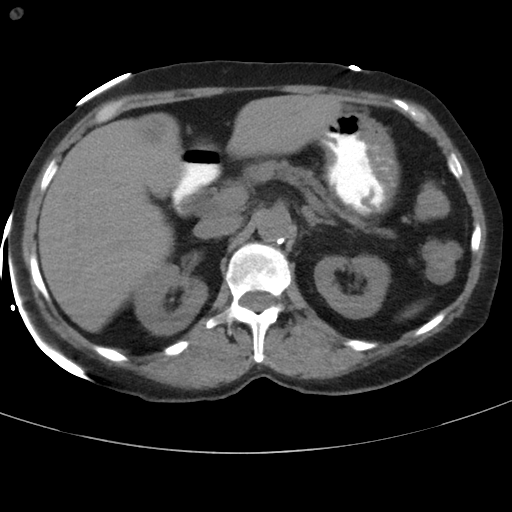
[im 78/93  soft-tissue]
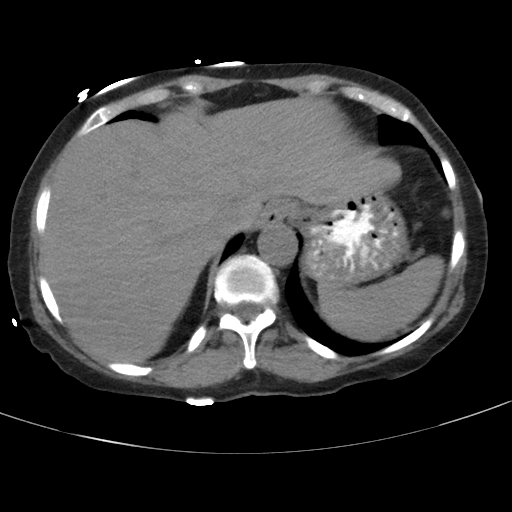
[im 85/93  soft-tissue]
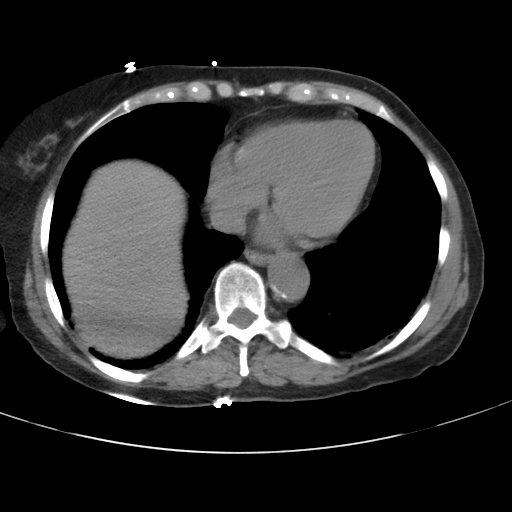
[im 85/93  bone]
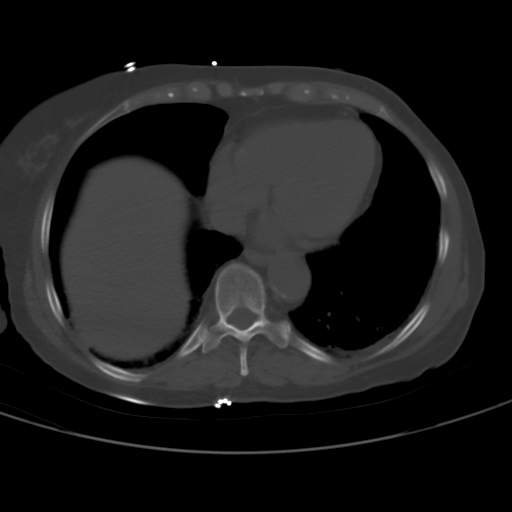

[Series 203: coronals, idose (2) · coronal · 0.45mm/px · 3 of 113 slices shown]
[im 38/113  soft-tissue]
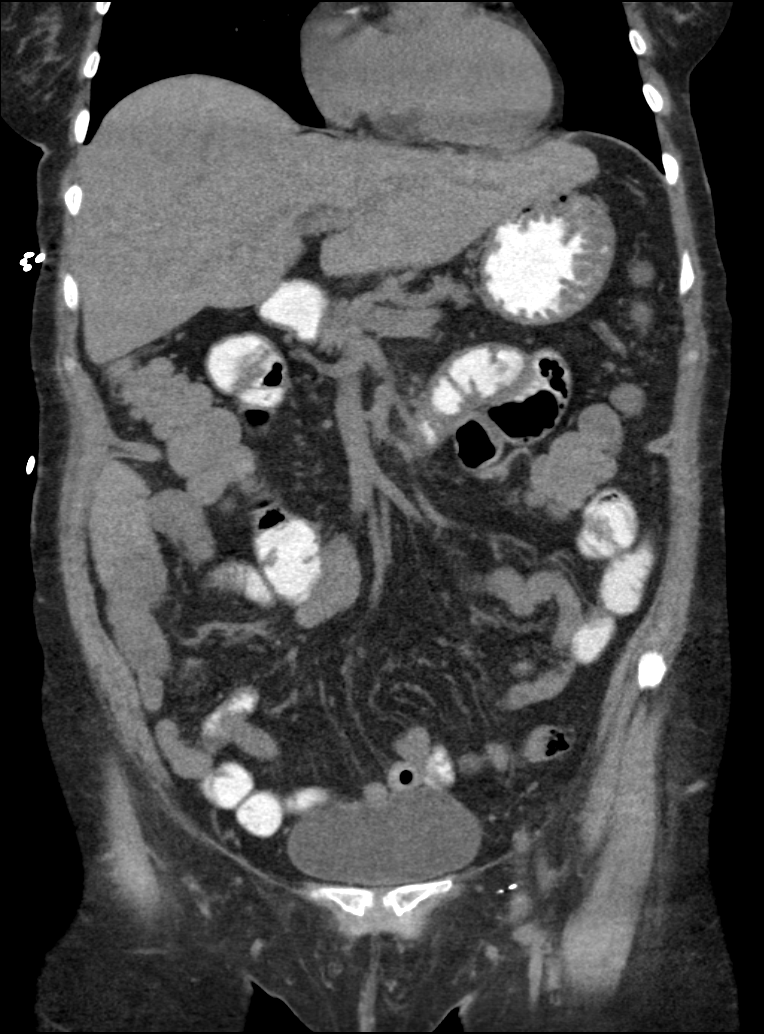
[im 50/113  soft-tissue]
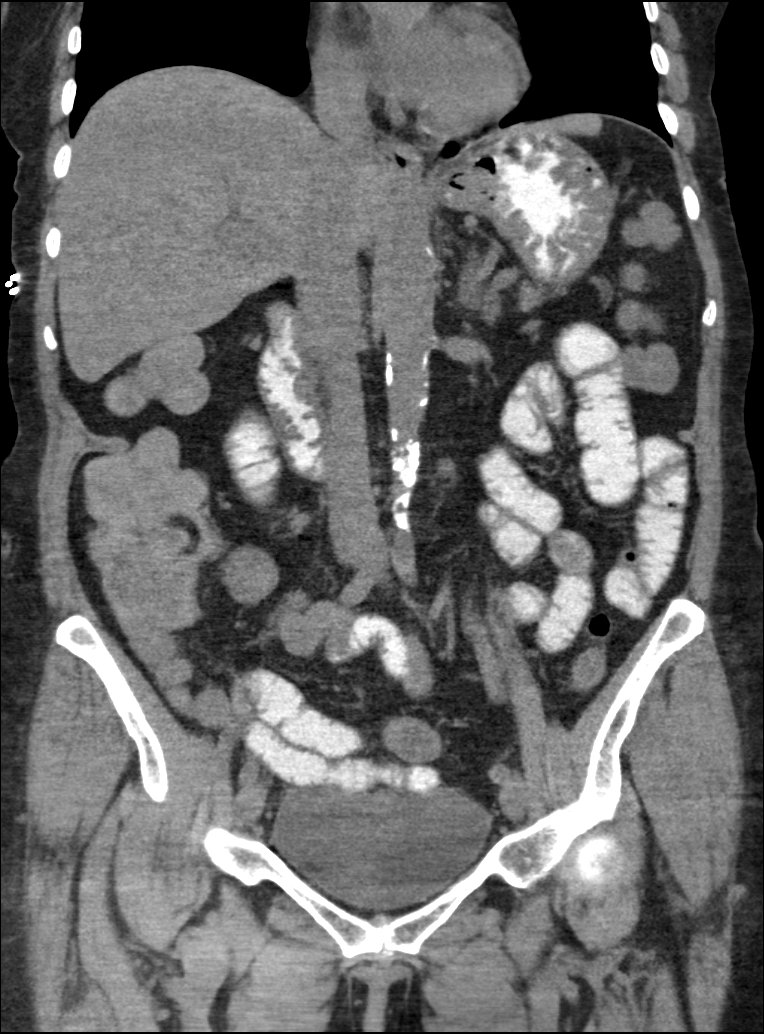
[im 63/113  soft-tissue]
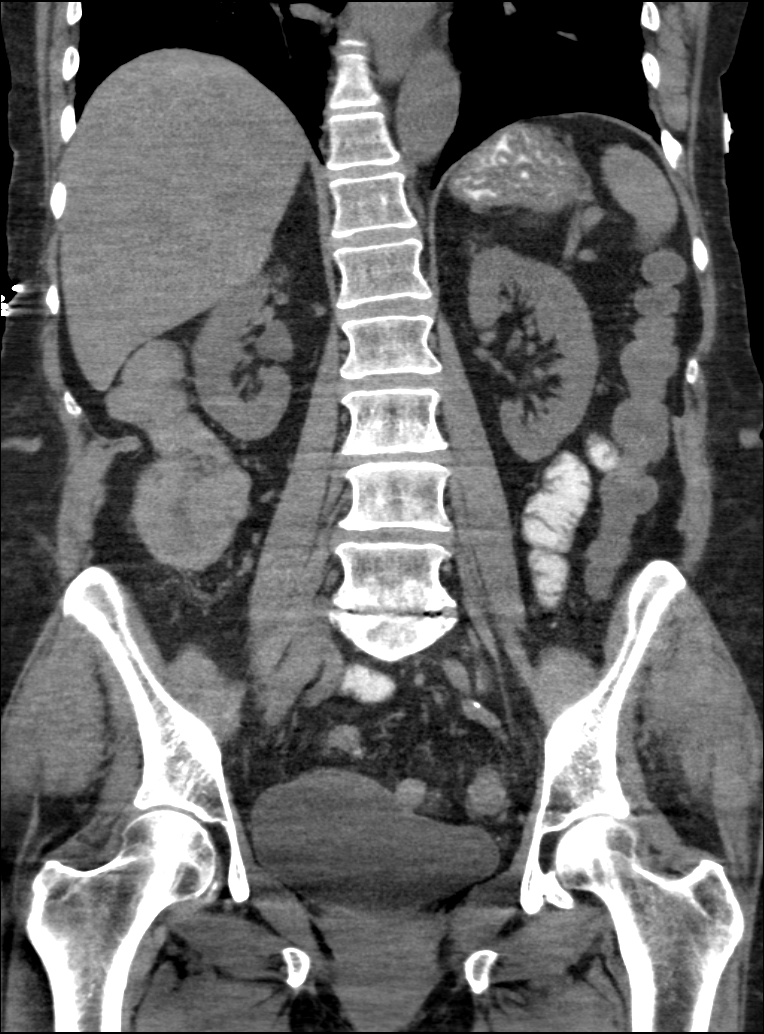

[12 of 46 positions shown; findings below may reference images not displayed]

FINDINGS: Lower chest and abdominal wall: No acute finding. Coronary
atherosclerotic calcification.

Hepatobiliary: Presumed 25 mm cyst along the gallbladder
fossa.Cholecystectomy. No bile duct distention.

Pancreas: Unremarkable.

Spleen: Unremarkable.

Adrenals/Urinary Tract: Known 28 mm left adrenal adenoma No
hydronephrosis or stone. Unremarkable bladder.

Stomach/Bowel: No obstruction. No appendicitis or other convincing
inflammation. 10 mm fatty density structure in the sigmoid colon was
not seen previously and is likely ingested capsule. Mild colonic
diverticulosis.

Reproductive:Hysterectomy. Possible oophorectomies. No evidence of
adnexal mass.

Vascular/Lymphatic: Atherosclerotic calcification. No acute vascular
finding No mass or adenopathy.

Other: No ascites or pneumoperitoneum.

Musculoskeletal: Usual degenerative changes. No acute or aggressive
finding.
IMPRESSION: 1. No explanation for acute abdominal pain.
2. Chronic and postoperative findings are stable from 7405 and
described above.

## 2017-05-17 ENCOUNTER — Inpatient Hospital Stay: Payer: Medicare Other | Attending: Hematology | Admitting: Adult Health

## 2017-05-17 ENCOUNTER — Ambulatory Visit (INDEPENDENT_AMBULATORY_CARE_PROVIDER_SITE_OTHER): Payer: Medicare Other | Admitting: Orthopedic Surgery

## 2017-05-17 ENCOUNTER — Encounter: Payer: Self-pay | Admitting: Adult Health

## 2017-05-17 VITALS — BP 148/84 | HR 84 | Temp 97.6°F | Resp 20 | Ht 67.0 in | Wt 175.7 lb

## 2017-05-17 DIAGNOSIS — Z79811 Long term (current) use of aromatase inhibitors: Secondary | ICD-10-CM | POA: Insufficient documentation

## 2017-05-17 DIAGNOSIS — M8580 Other specified disorders of bone density and structure, unspecified site: Secondary | ICD-10-CM | POA: Insufficient documentation

## 2017-05-17 DIAGNOSIS — Z8673 Personal history of transient ischemic attack (TIA), and cerebral infarction without residual deficits: Secondary | ICD-10-CM | POA: Diagnosis not present

## 2017-05-17 DIAGNOSIS — C50412 Malignant neoplasm of upper-outer quadrant of left female breast: Secondary | ICD-10-CM

## 2017-05-17 DIAGNOSIS — M129 Arthropathy, unspecified: Secondary | ICD-10-CM | POA: Diagnosis not present

## 2017-05-17 DIAGNOSIS — Z85820 Personal history of malignant melanoma of skin: Secondary | ICD-10-CM | POA: Diagnosis not present

## 2017-05-17 DIAGNOSIS — I2510 Atherosclerotic heart disease of native coronary artery without angina pectoris: Secondary | ICD-10-CM | POA: Diagnosis not present

## 2017-05-17 DIAGNOSIS — G629 Polyneuropathy, unspecified: Secondary | ICD-10-CM

## 2017-05-17 DIAGNOSIS — F419 Anxiety disorder, unspecified: Secondary | ICD-10-CM | POA: Diagnosis not present

## 2017-05-17 DIAGNOSIS — C50211 Malignant neoplasm of upper-inner quadrant of right female breast: Secondary | ICD-10-CM | POA: Diagnosis not present

## 2017-05-17 DIAGNOSIS — E1140 Type 2 diabetes mellitus with diabetic neuropathy, unspecified: Secondary | ICD-10-CM

## 2017-05-17 DIAGNOSIS — Z79899 Other long term (current) drug therapy: Secondary | ICD-10-CM | POA: Diagnosis not present

## 2017-05-17 DIAGNOSIS — Z809 Family history of malignant neoplasm, unspecified: Secondary | ICD-10-CM | POA: Insufficient documentation

## 2017-05-17 DIAGNOSIS — Z803 Family history of malignant neoplasm of breast: Secondary | ICD-10-CM | POA: Diagnosis not present

## 2017-05-17 DIAGNOSIS — K219 Gastro-esophageal reflux disease without esophagitis: Secondary | ICD-10-CM | POA: Diagnosis not present

## 2017-05-17 DIAGNOSIS — E785 Hyperlipidemia, unspecified: Secondary | ICD-10-CM | POA: Insufficient documentation

## 2017-05-17 DIAGNOSIS — M1990 Unspecified osteoarthritis, unspecified site: Secondary | ICD-10-CM | POA: Diagnosis not present

## 2017-05-17 DIAGNOSIS — Z170 Estrogen receptor positive status [ER+]: Secondary | ICD-10-CM | POA: Insufficient documentation

## 2017-05-17 DIAGNOSIS — I509 Heart failure, unspecified: Secondary | ICD-10-CM | POA: Insufficient documentation

## 2017-05-17 DIAGNOSIS — I129 Hypertensive chronic kidney disease with stage 1 through stage 4 chronic kidney disease, or unspecified chronic kidney disease: Secondary | ICD-10-CM | POA: Diagnosis not present

## 2017-05-17 NOTE — Progress Notes (Signed)
CLINIC:  Survivorship   REASON FOR VISIT:  Routine follow-up post-treatment for a recent history of breast cancer.  BRIEF ONCOLOGIC HISTORY:  Oncology History   Cancer Staging Malignant 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, estrogen receptor positive ([place]) Staging form: Breast, AJCC 8th Edition - Clinical: Stage IA (cT1c, cN0, cM0, G1, ER: Positive, PR: Positive, HER2: Negative) - Unsigned       Malignant 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, estrogen receptor positive ([place])   10/20/2016 Initial Biopsy    Diagnosis Breast, right, needle core biopsy, 11:30 o'clock - INVASIVE DUCTAL CARCINOMA,      10/20/2016 Initial Diagnosis    Malignant 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, estrogen receptor positive ([place])      10/20/2016 Receptors her2    Estrogen Receptor: 100%, POSITIVE, STRONG STAINING INTENSITY Progesterone Receptor: 100%, POSITIVE, STRONG STAINING INTENSITY Proliferation Marker Ki67: 5% HER2 NEGATIVE       10/20/2016 Mammogram    Diagnostic mammogram showed persistent distortion corresponds to a vaguely palpable irregular mass and is suspicious for malignancy.Targeted ultrasound is performed, showing an irregular hypoechoic mass with posterior acoustic shadowing in the 11:30 o'clock location of the right breast 4 cm from the nipple which measures 0.9 x 0.9 x0.8 cm. There is associated internal vascularity. Evaluation of the axilla is negative for adenopathy.      11/01/2016 Imaging    B/l BREAST MRI: IMPRESSION: 1. There is a 1.5 cm biopsy proven malignancy in the upper slightly outer quadrant of the right breast. No additional sites of disease are identified.  2. There is an indeterminate 6 mm enhancing mass in the upper-outer left breast.      11/11/2016 Pathology Results    Diagnosis Breast, left, needle core biopsy, upper outer - INVASIVE DUCTAL CARCINOMA. - ATYPICAL LOBULAR  HYPERPLASIA. - FIBROCYSTIC CHANGES AND PSEUDOANGIOMATOUS STROMAL HYPERPLASIA ([place]).      11/11/2016 Receptors her2     Estrogen Receptor: 100%, POSITIVE, STRONG STAINING INTENSITY Progesterone Receptor: 10%, POSITIVE, WEAK STAINING INTENSITY Proliferation Marker Ki67: 2% HER2 NEGATIVE       12/03/2016 Genetic Testing    Germline genetic testing was performed through [employer]'s Common Hereditary Cancers Panel + [facility]'s Melanoma Panel. This custom panel includes analysis of the following 51 genes: APC, ATM, AXIN2, BAP1, BARD1, BMPR1A, BRCA1, BRCA2, BRIP1, CDH1, CDK4, CDKN2A, CHEK2, CTNNA1, DICER1, EPCAM, GREM1, HOXB13, KIT, MEN1, MITF, MLH1, MSH2, MSH3, MSH6, MUTYH, NBN, NF1, NTHL1, PALB2, PDGFRA, PMS2, POLD1, POLE, POT1, PTEN, RAD50, RAD51C, RAD51D, RB1, SDHA, SDHB, SDHC, SDHD, SMAD4, SMARCA4, STK11, TP53, TSC1, TSC2, and VHL.  A variant of uncertain significance (VUS) was noted in BARD1.       12/14/2016 Miscellaneous    Oncotype Recurrence score of 16 10-year risk of distant recurrence with Tamoxifen alone is 10%      12/14/2016 Surgery    BILATERAL BREAST LUMPECTOMY WITH BILATERAL  RADIOACTIVE SEED AND BILATERAL AXILLARY  SENTINEL LYMPH NODE BIOPSY ERAS PATHWAY by Dr. Lucia Gaskins on 12/14/16      12/14/2016 Pathology Results    Diagnosis 12/14/16 1. Breast, lumpectomy, Left w/seed - INVASIVE DUCTAL CARCINOMA, 1 CM. - ATYPICAL LOBULAR HYPERPLASIA ([place]). - PREVIOUS BIOPSY SITE. - INVASIVE CARCINOMA 0.2 CM FROM LATERAL MARGIN. - ATYPICA LOBULAR HYPERPLASIA LESS THAN 0.1 CM FROM LATERAL MARGIN. 2. Breast, lumpectomy, Right w/seed - INVASIVE DUCTAL CARCINOMA, 1.1 CM. - FIBROCYSTIC CHANGES. - PREVIOUS BIOPSY SITE. - INVASIVE CARCINOMA 1.1 CM FROM POSTERIOR MARGIN. 3. Lymph node,  sentinel, biopsy, Right Axillary Contents - BENIGN FIBROADIPOSE TISSUE. - NO LYMPH NODE TISSUE OR MALIGNANCY. 4. Lymph node, sentinel, biopsy, Left Axillary - ONE BENIGN LYMPH NODE (0/1).       01/17/2017  - 02/11/2017 Radiation Therapy    1. The Left breast was treated to 42.5 Gy in 17 fractions of 2.5 Gy.  2. The Right breast was treated to 42.5 Gy in 17 fractions of 2.5 Gy.  3. The Right breast was boosted to 7.5 Gy in 3 fractions of 2.5 Gy.  4. The Left breast was boosted to 7.5 Gy in 3 fractions of 2.5 Gy.          02/2017 -  Anti-estrogen oral therapy    Exemestane daily       INTERVAL HISTORY:  [name] presents to the [facility] today for our initial meeting to review her survivorship care plan detailing her treatment course for breast cancer, as well as monitoring long-term side effects of that treatment, education regarding health maintenance, screening, and overall wellness and health promotion.     Overall, [name] reports feeling quite well.  She is taking Exemestane daily and tells me that she has not had any side effects that she can tell.  She has mild joint aches and pains from arthritis, but otherwise is feeling well and without any concerns.    [name] is concerned over some right breast pain, this has been present ever since she had surgery.  She notes it is also in the same area where she dealt with shingles last year.  She describes this pain as a hot discomfort.  REVIEW OF SYSTEMS:  Review of Systems  Constitutional: Negative for appetite change, chills, fatigue and fever.  HENT:   Negative for hearing loss, lump/mass and trouble swallowing.   Eyes: Negative for eye problems and icterus.  Respiratory: Negative for chest tightness, cough and shortness of breath.   Cardiovascular: Negative for chest pain, leg swelling and palpitations.  Gastrointestinal: Negative for abdominal distention, abdominal pain, constipation, diarrhea, nausea and vomiting.  Endocrine: Negative for hot flashes.  Musculoskeletal: Negative for arthralgias.  Skin: Negative for itching and rash.  Neurological: Negative for dizziness, extremity weakness, headaches, numbness and seizures.   Hematological: Negative for adenopathy. Does not bruise/bleed easily.  Psychiatric/Behavioral: Negative for depression. The patient is not nervous/anxious.   Breast: Denies any new nodularity, masses, tenderness, nipple changes, or nipple discharge.      ONCOLOGY TREATMENT TEAM:  1. Surgeon:  Dr. Lucia Gaskins at [facility] 2. Medical Oncologist: [name]  3. Radiation Oncologist: Dr. Lisbeth Renshaw    PAST MEDICAL/SURGICAL HISTORY:  Past Medical History:  Diagnosis Date  . Anxiety   . Arthritis     osteoarthritis  . Breast cancer in female [facility])    Bilateral  . CAD S/P percutaneous coronary angioplasty 2007; March 2011   [employer] 3.0 mm 20 mm postdilated 3.6 mm in early mid LAD; status post ISR Cutting Balloon PTCA and March 11 along with PCI of distal mid lesion with a 3.0 mm 12 mm MultiLink vision BMS; the proximal stent causes jailing of SP1 and SP2 with ostial 70-80% lesions  . Cancer (HCC)    Melanoma, Squamous cell Carcinoma  . CHF (congestive heart failure) ([place])   . Chronic kidney disease    stage 2   . Dementia    mild  . Diabetes mellitus type 2 with neurological manifestations ([place])   . Dyslipidemia, goal LDL below 70   .  Full dentures   . Genetic testing 12/03/2016   Germline genetic testing was performed through [employer]'s Common Hereditary Cancers Panel + [facility]'s Melanoma Panel. This custom panel includes analysis of the following 51 genes: APC, ATM, AXIN2, BAP1, BARD1, BMPR1A, BRCA1, BRCA2, BRIP1, CDH1, CDK4, CDKN2A, CHEK2, CTNNA1, DICER1, EPCAM, GREM1, HOXB13, KIT, MEN1, MITF, MLH1, MSH2, MSH3, MSH6, MUTYH, NBN, NF1, NTHL1, PALB2, PDGFRA, PMS2, POLD1, POL  . GERD (gastroesophageal reflux disease)   . Headache(784.0)   . History of hematuria    Followed by Dr. Gaynelle Arabian  . Hypertension, essential, benign   . Nausea & vomiting 10/2015  . Peripheral neuropathy   . Stroke ([place])   . TIA (transient ischemic attack)    multiple in the past  . Wears glasses     Past Surgical History:  Procedure Laterality Date  . ABDOMINAL HYSTERECTOMY    . APPENDECTOMY    . BLADDER SUSPENSION    . BREAST LUMPECTOMY WITH RADIOACTIVE SEED AND SENTINEL LYMPH NODE BIOPSY Bilateral 12/14/2016   Procedure: BILATERAL BREAST LUMPECTOMY WITH BILATERAL  RADIOACTIVE SEED AND BILATERAL AXILLARY  SENTINEL LYMPH NODE BIOPSY ERAS PATHWAY;  Surgeon: Alphonsa Overall, MD;  Location: [facility];  Service: General;  Laterality: Bilateral;  PECTORAL BLOCK  . CARDIAC CATHETERIZATION  02/24/2006   85% stenosis in the proximal portion of LAD-arrangements made for PCI on 02/25/2006  . CARDIAC CATHETERIZATION  02/25/2006   80% LAD lesion stented with a 3x91m Liberte stent resulting in reduction of 80% lesion to 0% residual  . CARDIAC CATHETERIZATION  04/26/2006   Medical management  . CARDIAC CATHETERIZATION  06/24/2009   60-70% re-stenosis in the proximal LAD. A 3.25x15 cutting balloon, 3 inflations - 14atm-38sec, 13atm-39sec, and 12atm-40sec reduced to less than 10%. 60% stenosis of the mid/distal LAD stented with a 3x157mMultilink stent.  . Marland KitchenARDIAC CATHETERIZATION  07/09/2009   Medical management  . CAROTID DOPPLER  06/24/2009   40-59% R ICA stenosis. No significant ICA stenosis noted  . CHOLECYSTECTOMY    . DILATION AND CURETTAGE OF UTERUS    . JOINT REPLACEMENT Left   . LESION REMOVAL Right 03/11/2015   Procedure:  EXCISION OF RIGHT PRE TIBIAL LESION;  Surgeon: JaJudeth HornMD;  [facility] [facility] Service: General;  Laterality: Right;  . MASS EXCISION Left 06/10/2014   Procedure: EXCISION LEFT LOWER LEG LESION;  Surgeon: JaDoreen SalvageMD;  [place] [facility] Service: General;  Laterality: Left;  . Marland KitchenASS EXCISION Left 09/05/2015   Procedure: EXCISION OF LEFT FOREARM  MASS;  Surgeon: JaJudeth HornMD;  [place] [facility] Service: General;  Laterality: Left;  . NM MYOVIEW LTD  11/16/2011   5 beats a PVC during recovery.  No skin or  infarction.  Reached 5 METs, EKG negative for ischemia   . SHOULDER ARTHROSCOPY  10/15  . SHOULDER ARTHROSCOPY  1995   left  . TENDON REPAIR Left 05/12/2016   Procedure: Left Anterior Tibial Tendon Reconstruction;  Surgeon: MaNewt MinionMD;  [place] [facility] Service: Orthopedics;  Laterality: Left;  . TOTAL KNEE ARTHROPLASTY  2005   left  . TRANSTHORACIC ECHOCARDIOGRAM  10/2015   EF 55-60 %. Mild LVH. Mild pulmonary hypertension. Normal valves.  . TRANSTHORACIC ECHOCARDIOGRAM  12/31/2009/July 2015   IsLV size & function, Gr 1 DD w/ mild TR.;; b)09/2013: 55-60%. Grade 1 DD, mild aortic sclerosis  . WRIST ARTHROPLASTY  2010   cancer lt wrist     ALLERGIES:  Allergies  Allergen Reactions  . Lipitor [Atorvastatin] Other (See Comments)    Bone and muscle pain  . Lyrica [Pregabalin]     Weight gain, extremity swelling  . Nsaids Swelling  . Reglan [Metoclopramide]     Tardive dyskinesia; paradoxical reaction not relieved by benadryl  . Ultram [Tramadol] Other (See Comments)    Pt has seizure activity with this medication     CURRENT MEDICATIONS:  Outpatient Encounter Medications as of 05/17/2017  Medication Sig  . allopurinol (ZYLOPRIM) 100 MG tablet Take 1 tablet (100 mg total) by mouth daily.  . Biotin w/ Vitamins C & E (HAIR SKIN & NAILS GUMMIES PO) Take 2 tablets by mouth daily.   . bumetanide (BUMEX) 1 MG tablet Take 2 mg by mouth daily.   . busPIRone (BUSPAR) 7.5 MG tablet Take 7.5 mg by mouth at bedtime.   . Calcium Carb-Cholecalciferol (CALCIUM 600 + D PO) Take 1 tablet by mouth at bedtime.   . Cholecalciferol 2000 units CAPS Take 2,000 Units by mouth daily.  . clopidogrel (PLAVIX) 75 MG tablet Take 1 tablet (75 mg total) by mouth daily.  . COD LIVER OIL PO Take 415 mg by mouth daily.  . colchicine 0.6 MG tablet Take one tablet daily during acute gout attack for 3 days, discontinue after this. Resume in the event of another acute gout flare up.  . CRANBERRY PO Take 1  capsule by mouth daily.  . diphenhydrAMINE (BENADRYL) 50 MG capsule Take 50 mg by mouth at bedtime.  Marland Kitchen exemestane (AROMASIN) 25 MG tablet Take 1 tablet (25 mg total) daily after breakfast by mouth.  . Fish Oil-Krill Oil (KRILL OIL PLUS PO) Take 1 capsule by mouth daily.   Marland Kitchen gabapentin (NEURONTIN) 300 MG capsule Take 600 mg by mouth at bedtime.   . Iron-FA-B Cmp-C-Biot-Probiotic (FUSION PLUS) CAPS takes 1 tablet daily  . losartan (COZAAR) 100 MG tablet take 1 tablet daily  . Melatonin 5 MG CAPS Take 5 mg by mouth at bedtime.  . Methylfol-Methylcob-Acetylcyst (CEREFOLIN NAC) 6-2-600 MG TABS TAKE 1 CAPLET BY MOUTH ONCE DAILY.  . metoprolol succinate (TOPROL XL) 50 MG 24 hr tablet Take 0.5 tablet (25 mg total) by mouth daily.  . nitroGLYCERIN (NITROLINGUAL) 0.4 MG/SPRAY spray Place 1 spray under the tongue every 5 (five) minutes x 3 doses as needed for chest pain.   . pantoprazole (PROTONIX) 40 MG tablet TAKE 1 TABLET DAILY (CONTACT OFFICE FOR ADDITIONAL REFILLS, FINAL ATTEMPT)  . pramipexole (MIRAPEX) 0.25 MG tablet TAKE 1 TABLET AT 4:00 P.M. AND 2 TABLETS (0.5 MG) AT BEDTIME  . Resveratrol (RESERVAPAK PO) Take 1 tablet by mouth every evening.   . rosuvastatin (CRESTOR) 20 MG tablet Take 20 mg by mouth at bedtime.  . [DISCONTINUED] HYDROcodone-acetaminophen (NORCO/VICODIN) 5-325 MG tablet Take 1-2 tablets by mouth every 6 (six) hours as needed for moderate pain. (Patient not taking: Reported on 03/21/2017)  . [DISCONTINUED] topiramate (TOPAMAX) 50 MG tablet Take 0.5 tablets (25 mg total) by mouth 2 (two) times daily. (Patient not taking: Reported on 05/17/2017)   No facility-administered encounter medications on file as of 05/17/2017.      ONCOLOGIC FAMILY HISTORY:  Family History  Problem Relation Age of Onset  . Diabetes Mother   . Hypertension Mother   . Kidney disease Mother   . Hypertension Father   . Emphysema Father   . Heart attack Father   . Heart disease Father   . Arthritis  Sister   . Diabetes  Sister   . Hypertension Sister   . Breast cancer Sister 37       treated with mastectomy  . Cervical cancer Sister 29  . Hypertension Brother   . Hyperlipidemia Brother   . Diabetes Brother   . Stroke Brother   . Diabetes Sister   . Hypertension Sister   . Stomach cancer Sister 67  . Hypertension Brother   . ALS Brother        d.62s  . Breast cancer Daughter 33       triple negative treated with lumpectomy, chemo, radiation  . Heart attack Daughter 2       d.45  . COPD Daughter   . Cancer Paternal Aunt        unspecified type  . Cancer Paternal Uncle        unspecified type  . Cancer Paternal Uncle        unspecified type     SOCIAL HISTORY:  GATHA MCNULTY is married and lives with her husband in [place], [place].  She has one child who is living in [place], one grandchild that they raised as their own in [place].  Ms. Salser is currently retired.    She denies any current or history of tobacco, alcohol, or illicit drug use.     PHYSICAL EXAMINATION:  Vital Signs:   Vitals:   05/17/17 1413  BP: (!) 148/84  Pulse: 84  Resp: 20  Temp: 97.6 F (36.4 C)  SpO2: 100%   Filed Weights   05/17/17 1413  Weight: 175 lb 11.2 oz (79.7 kg)   General: Well-nourished, well-appearing female in no acute distress.  She is accompanied by her husband today.   HEENT: Head is normocephalic.  Pupils equal and reactive to light. Conjunctivae clear without exudate.  Sclerae anicteric. Oral mucosa is pink, moist.  Oropharynx is pink without lesions or erythema.  Lymph: No cervical, supraclavicular, or infraclavicular lymphadenopathy noted on palpation.  Cardiovascular: Regular rate and rhythm.Marland Kitchen Respiratory: Clear to auscultation bilaterally. Chest expansion symmetric; breathing non-labored.  Breasts: right breast s/p lumpectomy, mild amt of scar tissue present, no nodules, masses or swelling in breast noted, left breast s/p lumpectomy, mild amt of scar tissue  present, no nodule, no mass, no skin/nipple change GI: Abdomen soft and round; non-tender, non-distended. Bowel sounds normoactive.  GU: Deferred.  Neuro: No focal deficits. Steady gait.  Psych: Mood and affect normal and appropriate for situation.  Extremities: No edema. MSK: No focal spinal tenderness to palpation.  Full range of motion in bilateral upper extremities Skin: Warm and dry.  LABORATORY DATA:  None for this visit.  DIAGNOSTIC IMAGING:  None for this visit.      ASSESSMENT AND PLAN:  Ms.. [name] is a pleasant 77 y.o. 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  She presents to the Survivorship Clinic for our initial meeting and routine follow-up post-completion of treatment for breast cancer.    1. Stage IA bilateral breast cancer:  [name] is continuing to recover from definitive treatment for breast cancer. She will follow-up with her medical oncologist, [name] in 07/2017 with history and physical exam per surveillance protocol.  She will continue her anti-estrogen therapy with Exemestane. Thus far, she is tolerating the Exemestane well, with minimal side effects.Today, a comprehensive survivorship care plan and treatment summary was reviewed with the patient  today detailing her breast cancer diagnosis, treatment course, potential late/long-term effects of treatment, appropriate follow-up care with recommendations for the future, and patient education resources.  A copy of this summary, along with a letter will be sent to the patient's primary care provider via mail/fax/In Basket message after today's visit.    2. Breast pain: This is most consistent with post herpetic neuralgia.  She is going to f/u with Dr. Lucia Gaskins next month.  Should her breast change at all, she will let us know.  Her exam is  benign today.   3. Bone health:  Given [name]'s age/history of breast cancer and her current treatment regimen including anti-estrogen therapy with Exemestane, she is at risk for bone demineralization.  Her last DEXA scan was 02/24/2017, which showed osteopenia with a t score of -1.6 in the left femur. In the meantime, she was encouraged to increase her consumption of foods rich in calcium, as well as increase her weight-bearing activities.  She was given education on specific activities to promote bone health.  4. Cancer screening:  Due to [name]'s history and her age, she should receive screening for skin cancers, colon cancer, and gynecologic cancers.  The information and recommendations are listed on the patient's comprehensive care plan/treatment summary and were reviewed in detail with the patient.    5. Health maintenance and wellness promotion: [name] was encouraged to consume 5-7 servings of fruits and vegetables per day. We reviewed the "Nutrition Rainbow" handout, as well as the handout "Take Control of Your Health and Reduce Your Cancer Risk" from the [place].  She was also encouraged to engage in moderate to vigorous exercise for 30 minutes per day most days of the week. We discussed the [employer] fitness program, which is designed for cancer survivors to help them become more physically fit after cancer treatments.  She was instructed to limit her alcohol consumption and continue to abstain from tobacco use.     6. Support services/counseling: It is not uncommon for this period of the patient's cancer care trajectory to be one of many emotions and stressors.  We discussed an opportunity for her to participate in the next session of [facility] ("Finding Your New Normal") support group series designed for patients after they have completed treatment.   [name] was encouraged to take advantage of our many other support services programs, support groups, and/or counseling in  coping with her new life as a cancer survivor after completing anti-cancer treatment.  She was offered support today through active listening and expressive supportive counseling.  She was given information regarding our available services and encouraged to contact me with any questions or for help enrolling in any of our support group/programs.    Dispo:   -Return to cancer center in 07/2017 for follow up with [name]  -Mammogram due in 09/2017 -Follow up with Dr. Lucia Gaskins in 05/2017 -She is welcome to return back to the Survivorship Clinic at any time; no additional follow-up needed at this time.  -Consider referral back to survivorship as a long-term survivor for continued surveillance  A total of (30) minutes of face-to-face time was spent with this patient with greater than 50% of that time in counseling and care-coordination.   Gardenia Phlegm, NP Survivorship Program [facility] [phone]   Note: PRIMARY CARE PROVIDER [employer], [place] [phone]

## 2017-05-18 ENCOUNTER — Telehealth: Payer: Self-pay | Admitting: Adult Health

## 2017-05-18 NOTE — Telephone Encounter (Signed)
Per 2/19 no los

## 2017-05-23 IMAGING — CT CT HEAD W/O CM
4 series · 17 of 47 positions shown, 19 images · non-contrast
Comparison: Brain MR of 10/05/2013.  Head CT of 11/21/2009

CLINICAL DATA: Dizziness, lethargic, recent weakness that caused
her to fall she states she did not hit head, fell in the bathroom
11-17-15, early stages of dementia, hx of TIA several years " head
feels funny", hx of melanoma and squamous cell ...*comment was
truncated*

EXAM:
CT HEAD WITHOUT CONTRAST
TECHNIQUE: Contiguous axial images were obtained from the base of the skull
through the vertex without intravenous contrast.

[Series 3: head bone · axial · 0.49mm/px · z∈[+4,+36]mm · 3 of 63 slices shown]
[im 7/63  bone]
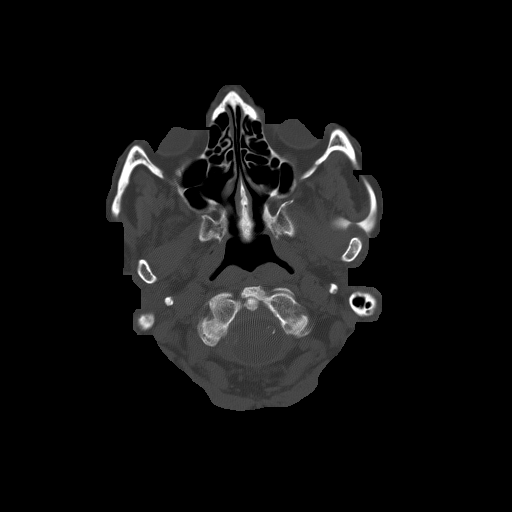
[im 14/63  bone]
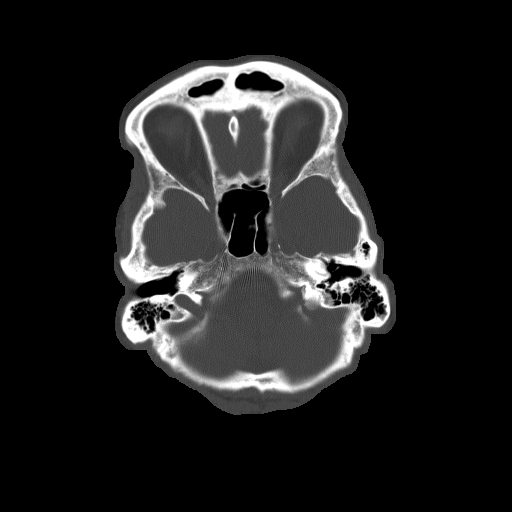
[im 20/63  bone]
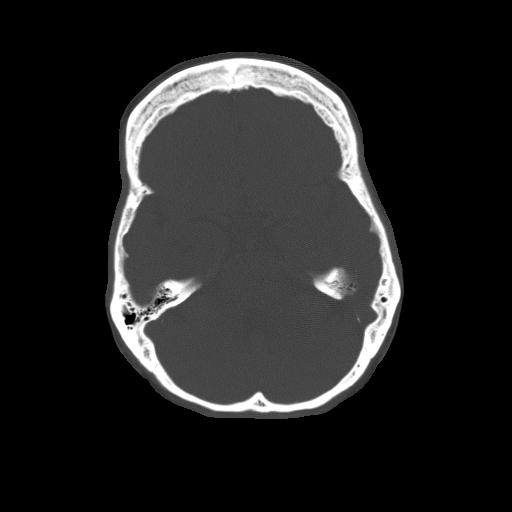

[Series 32: 3d filtered head w/o · axial · non-contrast · 0.49mm/px · z∈[+4,+124]mm · 8 of 32 slices shown, 10 images]
[im 4/32  brain]
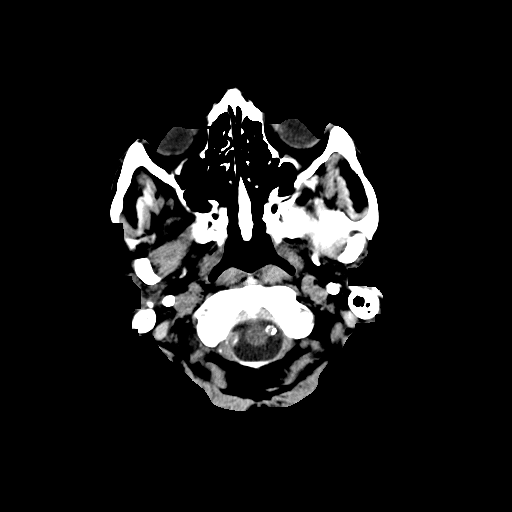
[im 4/32  bone]
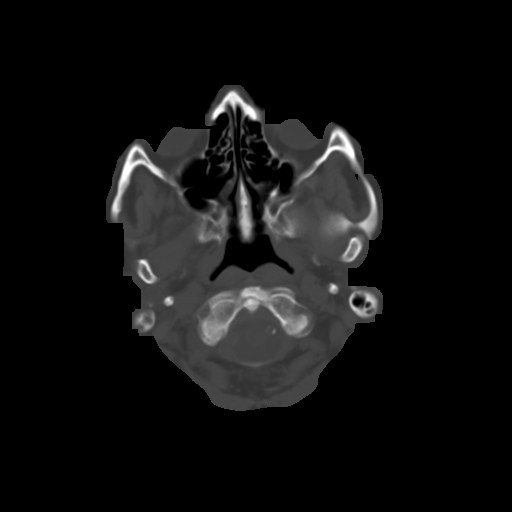
[im 7/32  brain]
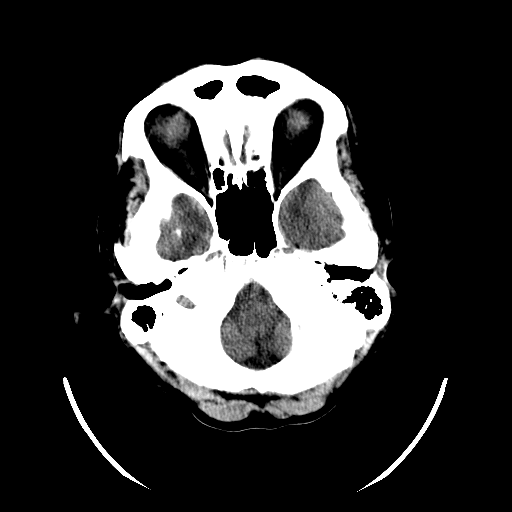
[im 11/32  brain]
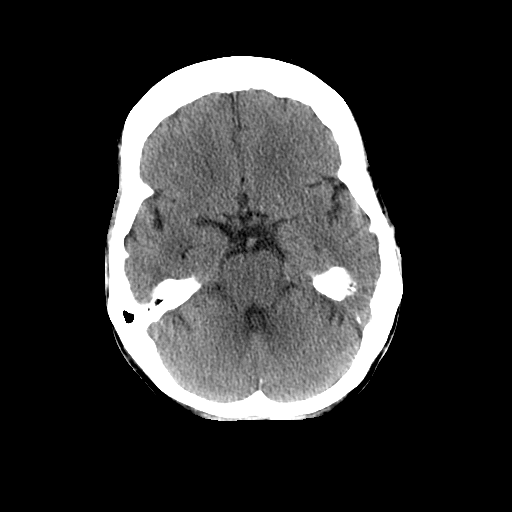
[im 14/32  brain]
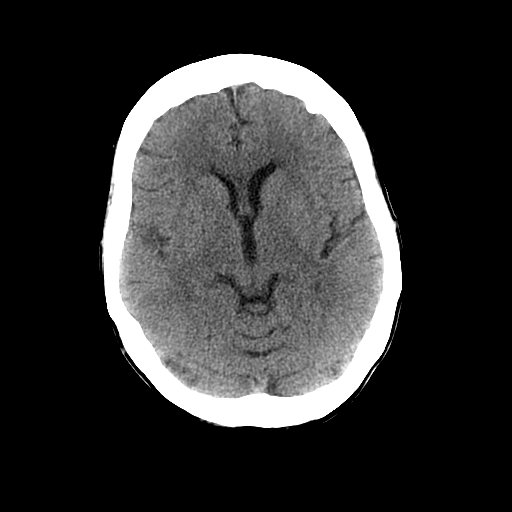
[im 18/32  brain]
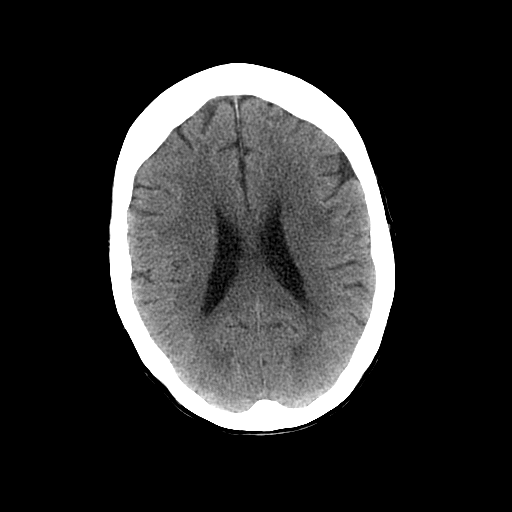
[im 18/32  bone]
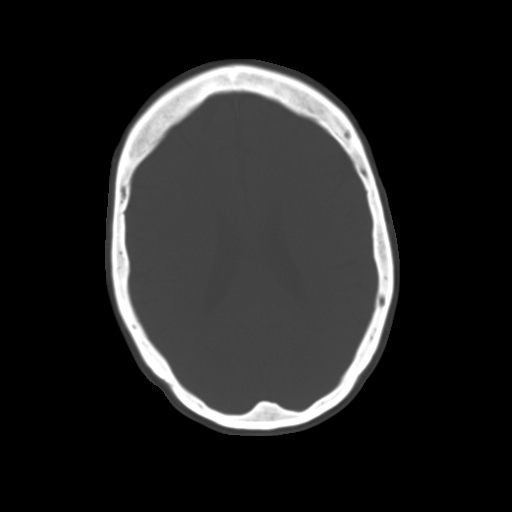
[im 21/32  brain]
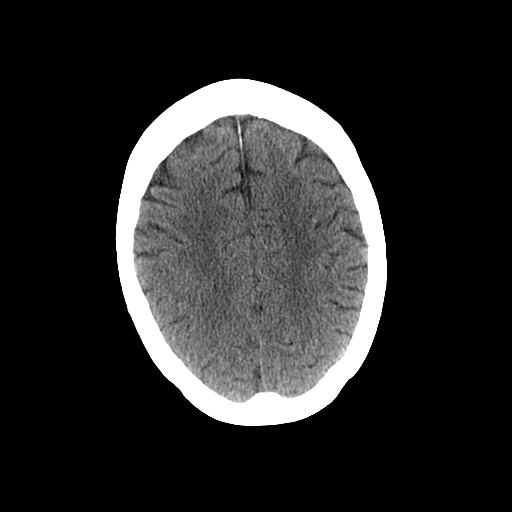
[im 25/32  brain]
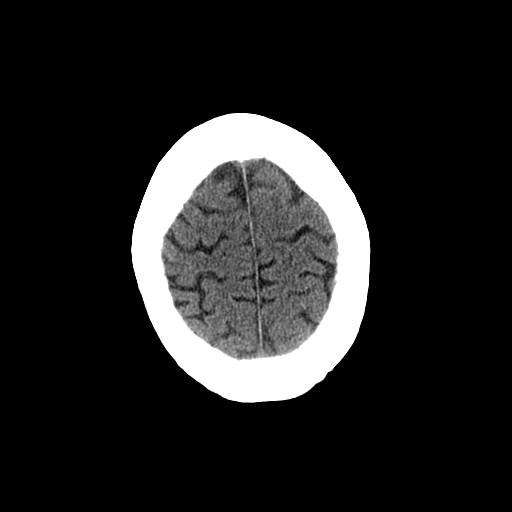
[im 28/32  brain]
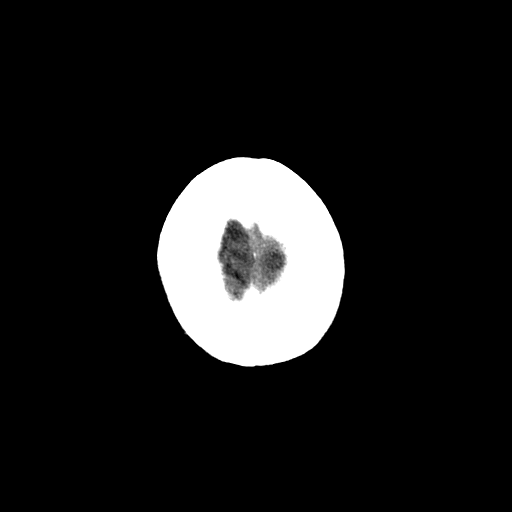

[Series 601: coronal brain · coronal · 0.49mm/px · 3 of 65 slices shown]
[im 22/65  brain]
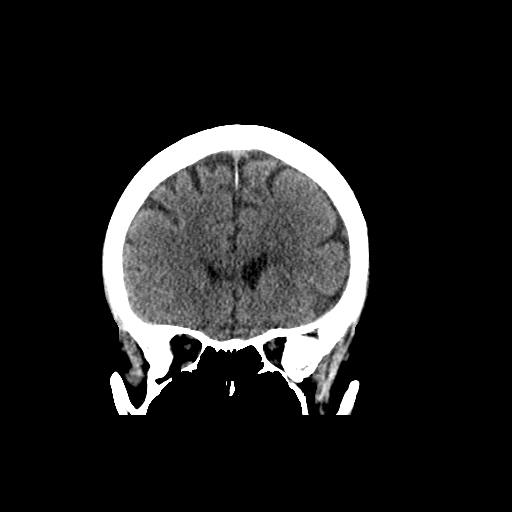
[im 29/65  brain]
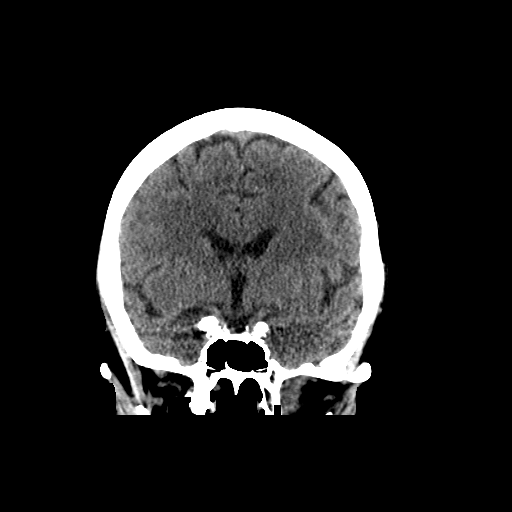
[im 36/65  brain]
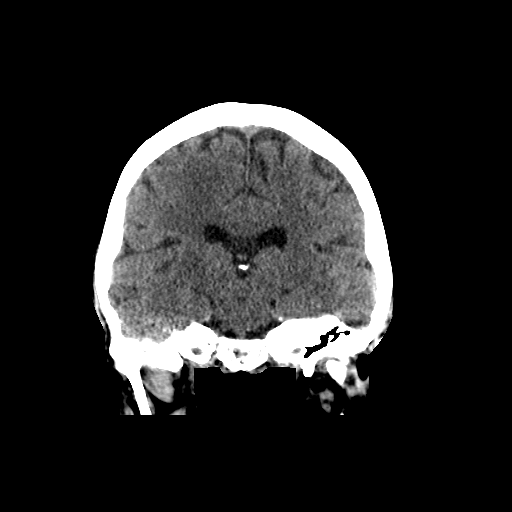

[Series 602: sagittal brain · sagittal · 0.49mm/px · 3 of 63 slices shown]
[im 21/63  brain]
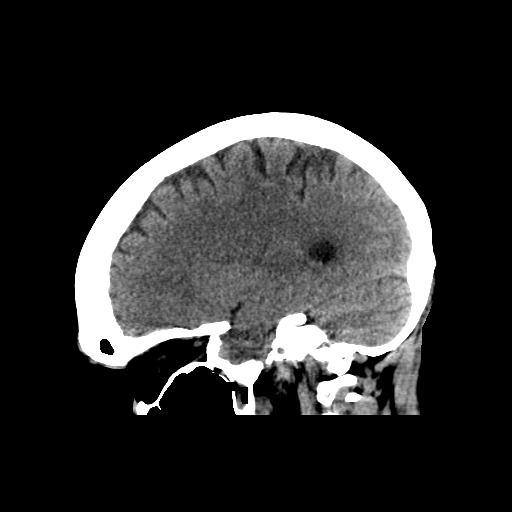
[im 32/63  brain]
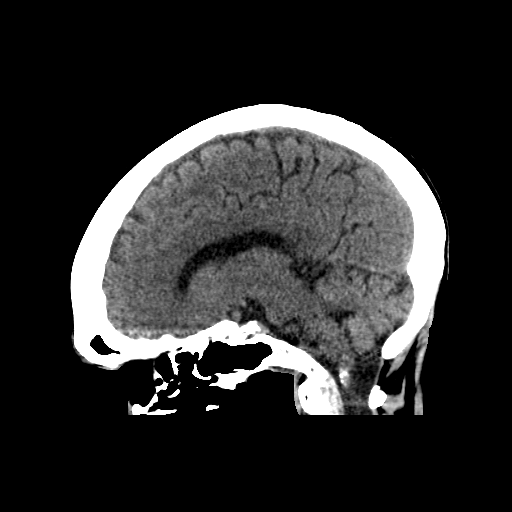
[im 42/63  brain]
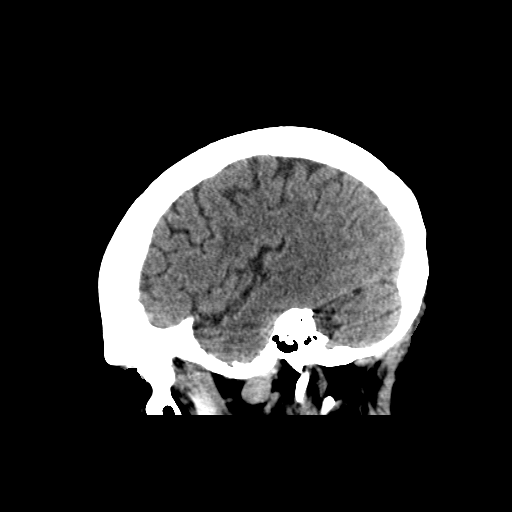

[17 of 47 positions shown; findings below may reference images not displayed]

FINDINGS: Brain: Moderate low density in the periventricular white matter
likely related to small vessel disease. No mass lesion, hemorrhage,
hydrocephalus, acute infarct, intra-axial, or extra-axial fluid
collection.

Vascular: Calcified atherosclerosis in bilateral vertebral arteries
and the intracranial carotid arteries.

Skull: Hyperostosis frontalis interna.

Sinuses/Orbits: Normal orbits and globes. Clear paranasal sinuses
and mastoid air cells.

Other: None
IMPRESSION: 1.  No acute intracranial abnormality.
2. Moderate small vessel ischemic change.

## 2017-06-01 DIAGNOSIS — Z6829 Body mass index (BMI) 29.0-29.9, adult: Secondary | ICD-10-CM | POA: Diagnosis not present

## 2017-06-01 DIAGNOSIS — K219 Gastro-esophageal reflux disease without esophagitis: Secondary | ICD-10-CM | POA: Diagnosis not present

## 2017-06-01 DIAGNOSIS — E119 Type 2 diabetes mellitus without complications: Secondary | ICD-10-CM | POA: Diagnosis not present

## 2017-06-01 DIAGNOSIS — M1990 Unspecified osteoarthritis, unspecified site: Secondary | ICD-10-CM | POA: Diagnosis not present

## 2017-06-01 DIAGNOSIS — G4700 Insomnia, unspecified: Secondary | ICD-10-CM | POA: Diagnosis not present

## 2017-06-01 DIAGNOSIS — F329 Major depressive disorder, single episode, unspecified: Secondary | ICD-10-CM | POA: Diagnosis not present

## 2017-06-01 DIAGNOSIS — F419 Anxiety disorder, unspecified: Secondary | ICD-10-CM | POA: Diagnosis not present

## 2017-06-01 DIAGNOSIS — R300 Dysuria: Secondary | ICD-10-CM | POA: Diagnosis not present

## 2017-06-01 DIAGNOSIS — N390 Urinary tract infection, site not specified: Secondary | ICD-10-CM | POA: Diagnosis not present

## 2017-06-01 DIAGNOSIS — E663 Overweight: Secondary | ICD-10-CM | POA: Diagnosis not present

## 2017-06-01 DIAGNOSIS — I10 Essential (primary) hypertension: Secondary | ICD-10-CM | POA: Diagnosis not present

## 2017-06-09 ENCOUNTER — Ambulatory Visit (HOSPITAL_COMMUNITY)
Admission: RE | Admit: 2017-06-09 | Discharge: 2017-06-09 | Disposition: A | Discharge status: Home / Self Care | Payer: Medicare Other | Source: Ambulatory Visit | Attending: Family Medicine | Admitting: Family Medicine

## 2017-06-09 ENCOUNTER — Other Ambulatory Visit (HOSPITAL_COMMUNITY): Payer: Self-pay | Admitting: Family Medicine

## 2017-06-09 DIAGNOSIS — J984 Other disorders of lung: Secondary | ICD-10-CM | POA: Diagnosis not present

## 2017-06-09 DIAGNOSIS — R0602 Shortness of breath: Secondary | ICD-10-CM | POA: Diagnosis not present

## 2017-06-09 DIAGNOSIS — J069 Acute upper respiratory infection, unspecified: Secondary | ICD-10-CM | POA: Diagnosis not present

## 2017-06-09 DIAGNOSIS — E6609 Other obesity due to excess calories: Secondary | ICD-10-CM | POA: Diagnosis not present

## 2017-06-09 DIAGNOSIS — E119 Type 2 diabetes mellitus without complications: Secondary | ICD-10-CM | POA: Diagnosis not present

## 2017-06-09 DIAGNOSIS — I509 Heart failure, unspecified: Secondary | ICD-10-CM | POA: Diagnosis not present

## 2017-06-09 DIAGNOSIS — K219 Gastro-esophageal reflux disease without esophagitis: Secondary | ICD-10-CM | POA: Diagnosis not present

## 2017-06-09 DIAGNOSIS — R05 Cough: Secondary | ICD-10-CM | POA: Diagnosis not present

## 2017-06-09 DIAGNOSIS — I10 Essential (primary) hypertension: Secondary | ICD-10-CM | POA: Diagnosis not present

## 2017-06-09 DIAGNOSIS — M0689 Other specified rheumatoid arthritis, multiple sites: Secondary | ICD-10-CM | POA: Diagnosis not present

## 2017-06-09 DIAGNOSIS — I2510 Atherosclerotic heart disease of native coronary artery without angina pectoris: Secondary | ICD-10-CM | POA: Diagnosis not present

## 2017-06-09 DIAGNOSIS — Z6830 Body mass index (BMI) 30.0-30.9, adult: Secondary | ICD-10-CM | POA: Diagnosis not present

## 2017-06-09 DIAGNOSIS — N183 Chronic kidney disease, stage 3 (moderate): Secondary | ICD-10-CM | POA: Diagnosis not present

## 2017-06-16 ENCOUNTER — Ambulatory Visit (INDEPENDENT_AMBULATORY_CARE_PROVIDER_SITE_OTHER): Payer: Medicare Other | Admitting: Orthopedic Surgery

## 2017-06-17 ENCOUNTER — Ambulatory Visit (INDEPENDENT_AMBULATORY_CARE_PROVIDER_SITE_OTHER): Payer: Medicare Other | Admitting: Orthopedic Surgery

## 2017-06-21 DIAGNOSIS — Z1389 Encounter for screening for other disorder: Secondary | ICD-10-CM | POA: Diagnosis not present

## 2017-06-21 DIAGNOSIS — K219 Gastro-esophageal reflux disease without esophagitis: Secondary | ICD-10-CM | POA: Diagnosis not present

## 2017-06-21 DIAGNOSIS — I10 Essential (primary) hypertension: Secondary | ICD-10-CM | POA: Diagnosis not present

## 2017-06-21 DIAGNOSIS — Z6829 Body mass index (BMI) 29.0-29.9, adult: Secondary | ICD-10-CM | POA: Diagnosis not present

## 2017-06-21 DIAGNOSIS — E663 Overweight: Secondary | ICD-10-CM | POA: Diagnosis not present

## 2017-06-21 DIAGNOSIS — G4700 Insomnia, unspecified: Secondary | ICD-10-CM | POA: Diagnosis not present

## 2017-06-21 DIAGNOSIS — M069 Rheumatoid arthritis, unspecified: Secondary | ICD-10-CM | POA: Diagnosis not present

## 2017-06-21 DIAGNOSIS — E11621 Type 2 diabetes mellitus with foot ulcer: Secondary | ICD-10-CM | POA: Diagnosis not present

## 2017-06-22 DIAGNOSIS — C50911 Malignant neoplasm of unspecified site of right female breast: Secondary | ICD-10-CM | POA: Diagnosis not present

## 2017-06-22 DIAGNOSIS — Z170 Estrogen receptor positive status [ER+]: Secondary | ICD-10-CM | POA: Diagnosis not present

## 2017-06-22 DIAGNOSIS — C50912 Malignant neoplasm of unspecified site of left female breast: Secondary | ICD-10-CM | POA: Diagnosis not present

## 2017-06-22 DIAGNOSIS — Z7901 Long term (current) use of anticoagulants: Secondary | ICD-10-CM | POA: Diagnosis not present

## 2017-06-28 DIAGNOSIS — E7800 Pure hypercholesterolemia, unspecified: Secondary | ICD-10-CM | POA: Diagnosis not present

## 2017-06-28 DIAGNOSIS — E1165 Type 2 diabetes mellitus with hyperglycemia: Secondary | ICD-10-CM | POA: Diagnosis not present

## 2017-06-28 DIAGNOSIS — I10 Essential (primary) hypertension: Secondary | ICD-10-CM | POA: Diagnosis not present

## 2017-06-28 DIAGNOSIS — N189 Chronic kidney disease, unspecified: Secondary | ICD-10-CM | POA: Diagnosis not present

## 2017-06-28 DIAGNOSIS — G609 Hereditary and idiopathic neuropathy, unspecified: Secondary | ICD-10-CM | POA: Diagnosis not present

## 2017-07-05 ENCOUNTER — Telehealth: Payer: Self-pay | Admitting: Neurology

## 2017-07-05 ENCOUNTER — Other Ambulatory Visit: Payer: Self-pay

## 2017-07-05 MED ORDER — CEREFOLIN NAC 6-2-600 MG PO TABS: ORAL_TABLET | ORAL | 3 refills | Status: DC

## 2017-07-05 NOTE — Telephone Encounter (Signed)
Rn call patient about her cerefolin rx. Rn stated no request was receive from [facility] via computer , or paper fax. RN stated a refill will be done. PT appreciate the call, and verbalized understand. PT will call if [facility] does not receive the refill on cerefolin.

## 2017-07-05 NOTE — Telephone Encounter (Signed)
Pt calling re: her Methylfol-Methylcob-Acetylcyst (CEREFOLIN NAC) 6-2-600 MG TABS she states she has been on this medication for a while. [facility]  (which is who pt gets the medication thru) states they have reached out to Dr Leonie Man twice for a refill with no response.  Pt states she very much needs this medication.  Please call pt.  [facility]'s # is [phone] Rx # [id number]

## 2017-07-07 ENCOUNTER — Other Ambulatory Visit: Payer: Self-pay | Admitting: Cardiology

## 2017-07-07 NOTE — Telephone Encounter (Signed)
REFILL 

## 2017-07-14 DIAGNOSIS — N312 Flaccid neuropathic bladder, not elsewhere classified: Secondary | ICD-10-CM | POA: Diagnosis not present

## 2017-07-14 DIAGNOSIS — N139 Obstructive and reflux uropathy, unspecified: Secondary | ICD-10-CM | POA: Diagnosis not present

## 2017-07-14 DIAGNOSIS — N3000 Acute cystitis without hematuria: Secondary | ICD-10-CM | POA: Diagnosis not present

## 2017-07-19 ENCOUNTER — Other Ambulatory Visit: Payer: Self-pay | Admitting: Cardiology

## 2017-07-26 ENCOUNTER — Telehealth: Payer: Self-pay | Admitting: Hematology

## 2017-07-26 NOTE — Telephone Encounter (Signed)
Called pt re appts being moved due to yf being out - left vm for pt re appts.

## 2017-07-27 HISTORY — PX: TRANSESOPHAGEAL ECHOCARDIOGRAM: SHX273

## 2017-07-27 HISTORY — PX: TRANSTHORACIC ECHOCARDIOGRAM: SHX275

## 2017-07-28 ENCOUNTER — Telehealth: Payer: Self-pay | Admitting: Cardiology

## 2017-07-28 ENCOUNTER — Other Ambulatory Visit: Payer: Self-pay

## 2017-07-28 DIAGNOSIS — Z79899 Other long term (current) drug therapy: Secondary | ICD-10-CM

## 2017-07-28 DIAGNOSIS — R600 Localized edema: Secondary | ICD-10-CM

## 2017-07-28 NOTE — Telephone Encounter (Signed)
Spoke with pt and advised of [name] recommendation. Pt verbalized understanding.

## 2017-07-28 NOTE — Telephone Encounter (Signed)
Pt's greatgranddaughter is calling and stating pt is having leg swelling.  Pt c/o swelling: STAT is pt has developed SOB within 24 hours  1) How much weight have you gained and in what time span? Don't knw  2) If swelling, where is the swelling located? Lower legs and feet and ankles  3) Are you currently taking a fluid pill? yes  4) Are you currently SOB? no  5) Do you have a log of your daily weights (if so, list)? no  6) Have you gained 3 pounds in a day or 5 pounds in a week? Not sure          7)Have you traveled recently? No

## 2017-07-28 NOTE — Telephone Encounter (Signed)
Spoke with Pt who reports she has been experiencing bilaterally leg swelling up to her knee X 3 weeks that caused difficult walking. Pt states 3 weeks ago she weighed 172-174 and this morning her weight is up 190. She c/o of SOB with exertions.  She also reports she is wearing her compression hose, taking Bumex as prescribed along with elevating her legs. She also reports difficult urinating(slow stream) and having to be catheterized.   Appointment made for 08/03/17 at 9 am with [name]. Routing to MD for further recommendation.

## 2017-07-28 NOTE — Telephone Encounter (Signed)
Double up Bumex (4mg ) x Fri-Mon & check Chem panel Monday 5/6.  If Wgt still up & not going down after 2 d of increased dose, may need IV diuretic.  I have not seen her in a year.     Glenetta Hew, MD

## 2017-08-01 ENCOUNTER — Telehealth: Payer: Self-pay

## 2017-08-01 ENCOUNTER — Other Ambulatory Visit: Payer: Self-pay

## 2017-08-01 ENCOUNTER — Inpatient Hospital Stay (HOSPITAL_COMMUNITY)
Admission: EM | Admit: 2017-08-01 | Discharge: 2017-08-03 | DRG: 291 | Disposition: A | Discharge status: Home / Self Care | Payer: Medicare Other | Attending: Family Medicine | Admitting: Family Medicine

## 2017-08-01 ENCOUNTER — Emergency Department (HOSPITAL_COMMUNITY): Payer: Medicare Other

## 2017-08-01 ENCOUNTER — Encounter (HOSPITAL_COMMUNITY): Payer: Self-pay | Admitting: Emergency Medicine

## 2017-08-01 DIAGNOSIS — K219 Gastro-esophageal reflux disease without esophagitis: Secondary | ICD-10-CM | POA: Diagnosis present

## 2017-08-01 DIAGNOSIS — Z853 Personal history of malignant neoplasm of breast: Secondary | ICD-10-CM | POA: Diagnosis not present

## 2017-08-01 DIAGNOSIS — E1122 Type 2 diabetes mellitus with diabetic chronic kidney disease: Secondary | ICD-10-CM | POA: Diagnosis present

## 2017-08-01 DIAGNOSIS — I130 Hypertensive heart and chronic kidney disease with heart failure and stage 1 through stage 4 chronic kidney disease, or unspecified chronic kidney disease: Secondary | ICD-10-CM | POA: Diagnosis not present

## 2017-08-01 DIAGNOSIS — M545 Low back pain, unspecified: Secondary | ICD-10-CM

## 2017-08-01 DIAGNOSIS — N179 Acute kidney failure, unspecified: Secondary | ICD-10-CM | POA: Diagnosis present

## 2017-08-01 DIAGNOSIS — Z8673 Personal history of transient ischemic attack (TIA), and cerebral infarction without residual deficits: Secondary | ICD-10-CM | POA: Diagnosis not present

## 2017-08-01 DIAGNOSIS — R5383 Other fatigue: Secondary | ICD-10-CM | POA: Diagnosis not present

## 2017-08-01 DIAGNOSIS — D151 Benign neoplasm of heart: Secondary | ICD-10-CM | POA: Diagnosis not present

## 2017-08-01 DIAGNOSIS — Z8349 Family history of other endocrine, nutritional and metabolic diseases: Secondary | ICD-10-CM

## 2017-08-01 DIAGNOSIS — F419 Anxiety disorder, unspecified: Secondary | ICD-10-CM | POA: Diagnosis present

## 2017-08-01 DIAGNOSIS — E1142 Type 2 diabetes mellitus with diabetic polyneuropathy: Secondary | ICD-10-CM | POA: Diagnosis present

## 2017-08-01 DIAGNOSIS — Z7902 Long term (current) use of antithrombotics/antiplatelets: Secondary | ICD-10-CM

## 2017-08-01 DIAGNOSIS — Z170 Estrogen receptor positive status [ER+]: Secondary | ICD-10-CM | POA: Diagnosis not present

## 2017-08-01 DIAGNOSIS — M48061 Spinal stenosis, lumbar region without neurogenic claudication: Secondary | ICD-10-CM | POA: Diagnosis not present

## 2017-08-01 DIAGNOSIS — F0390 Unspecified dementia without behavioral disturbance: Secondary | ICD-10-CM | POA: Diagnosis present

## 2017-08-01 DIAGNOSIS — Z8261 Family history of arthritis: Secondary | ICD-10-CM

## 2017-08-01 DIAGNOSIS — E118 Type 2 diabetes mellitus with unspecified complications: Secondary | ICD-10-CM | POA: Diagnosis not present

## 2017-08-01 DIAGNOSIS — Z8249 Family history of ischemic heart disease and other diseases of the circulatory system: Secondary | ICD-10-CM

## 2017-08-01 DIAGNOSIS — I2510 Atherosclerotic heart disease of native coronary artery without angina pectoris: Secondary | ICD-10-CM | POA: Diagnosis not present

## 2017-08-01 DIAGNOSIS — Z9861 Coronary angioplasty status: Secondary | ICD-10-CM

## 2017-08-01 DIAGNOSIS — E785 Hyperlipidemia, unspecified: Secondary | ICD-10-CM | POA: Diagnosis present

## 2017-08-01 DIAGNOSIS — N184 Chronic kidney disease, stage 4 (severe): Secondary | ICD-10-CM | POA: Diagnosis present

## 2017-08-01 DIAGNOSIS — C50911 Malignant neoplasm of unspecified site of right female breast: Secondary | ICD-10-CM | POA: Diagnosis present

## 2017-08-01 DIAGNOSIS — Z800 Family history of malignant neoplasm of digestive organs: Secondary | ICD-10-CM

## 2017-08-01 DIAGNOSIS — R600 Localized edema: Secondary | ICD-10-CM | POA: Diagnosis not present

## 2017-08-01 DIAGNOSIS — E1149 Type 2 diabetes mellitus with other diabetic neurological complication: Secondary | ICD-10-CM | POA: Diagnosis present

## 2017-08-01 DIAGNOSIS — I509 Heart failure, unspecified: Secondary | ICD-10-CM

## 2017-08-01 DIAGNOSIS — I5189 Other ill-defined heart diseases: Secondary | ICD-10-CM

## 2017-08-01 DIAGNOSIS — Z8049 Family history of malignant neoplasm of other genital organs: Secondary | ICD-10-CM

## 2017-08-01 DIAGNOSIS — G8929 Other chronic pain: Secondary | ICD-10-CM | POA: Diagnosis not present

## 2017-08-01 DIAGNOSIS — Z66 Do not resuscitate: Secondary | ICD-10-CM | POA: Diagnosis present

## 2017-08-01 DIAGNOSIS — I519 Heart disease, unspecified: Secondary | ICD-10-CM | POA: Diagnosis not present

## 2017-08-01 DIAGNOSIS — Z886 Allergy status to analgesic agent status: Secondary | ICD-10-CM

## 2017-08-01 DIAGNOSIS — Z885 Allergy status to narcotic agent status: Secondary | ICD-10-CM

## 2017-08-01 DIAGNOSIS — Z888 Allergy status to other drugs, medicaments and biological substances status: Secondary | ICD-10-CM

## 2017-08-01 DIAGNOSIS — I110 Hypertensive heart disease with heart failure: Secondary | ICD-10-CM | POA: Diagnosis not present

## 2017-08-01 DIAGNOSIS — Z841 Family history of disorders of kidney and ureter: Secondary | ICD-10-CM

## 2017-08-01 DIAGNOSIS — M1990 Unspecified osteoarthritis, unspecified site: Secondary | ICD-10-CM | POA: Diagnosis present

## 2017-08-01 DIAGNOSIS — IMO0002 Reserved for concepts with insufficient information to code with codable children: Secondary | ICD-10-CM | POA: Diagnosis present

## 2017-08-01 DIAGNOSIS — Z90710 Acquired absence of both cervix and uterus: Secondary | ICD-10-CM | POA: Diagnosis not present

## 2017-08-01 DIAGNOSIS — Z823 Family history of stroke: Secondary | ICD-10-CM

## 2017-08-01 DIAGNOSIS — Z833 Family history of diabetes mellitus: Secondary | ICD-10-CM | POA: Diagnosis not present

## 2017-08-01 DIAGNOSIS — Z79899 Other long term (current) drug therapy: Secondary | ICD-10-CM

## 2017-08-01 DIAGNOSIS — R0789 Other chest pain: Secondary | ICD-10-CM | POA: Diagnosis not present

## 2017-08-01 DIAGNOSIS — M7989 Other specified soft tissue disorders: Secondary | ICD-10-CM | POA: Diagnosis not present

## 2017-08-01 DIAGNOSIS — E1165 Type 2 diabetes mellitus with hyperglycemia: Secondary | ICD-10-CM | POA: Diagnosis not present

## 2017-08-01 DIAGNOSIS — Z85820 Personal history of malignant melanoma of skin: Secondary | ICD-10-CM

## 2017-08-01 DIAGNOSIS — Z79811 Long term (current) use of aromatase inhibitors: Secondary | ICD-10-CM

## 2017-08-01 DIAGNOSIS — I10 Essential (primary) hypertension: Secondary | ICD-10-CM | POA: Diagnosis present

## 2017-08-01 DIAGNOSIS — I5030 Unspecified diastolic (congestive) heart failure: Secondary | ICD-10-CM | POA: Diagnosis not present

## 2017-08-01 DIAGNOSIS — I5033 Acute on chronic diastolic (congestive) heart failure: Secondary | ICD-10-CM | POA: Diagnosis not present

## 2017-08-01 DIAGNOSIS — I5032 Chronic diastolic (congestive) heart failure: Secondary | ICD-10-CM

## 2017-08-01 LAB — BASIC METABOLIC PANEL
Anion gap: 12 (ref 5–15)
BUN: 25 mg/dL — ABNORMAL HIGH (ref 6–20)
CO2: 27 mmol/L (ref 22–32)
Calcium: 10 mg/dL (ref 8.9–10.3)
Chloride: 97 mmol/L — ABNORMAL LOW (ref 101–111)
Creatinine, Ser: 1.94 mg/dL — ABNORMAL HIGH (ref 0.44–1.00)
GFR calc Af Amer: 27 mL/min — ABNORMAL LOW (ref >60)
GFR calc non Af Amer: 24 mL/min — ABNORMAL LOW (ref >60)
Glucose, Bld: 220 mg/dL — ABNORMAL HIGH (ref 65–99)
Potassium: 4.1 mmol/L (ref 3.5–5.1)
Sodium: 136 mmol/L (ref 135–145)

## 2017-08-01 LAB — I-STAT TROPONIN, ED: Troponin i, poc: 0 ng/mL (ref 0.00–0.08)

## 2017-08-01 LAB — CBC
HCT: 37.6 % (ref 36.0–46.0)
Hemoglobin: 12.1 g/dL (ref 12.0–15.0)
MCH: 30.2 pg (ref 26.0–34.0)
MCHC: 32.2 g/dL (ref 30.0–36.0)
MCV: 93.8 fL (ref 78.0–100.0)
Platelets: 274 10*3/uL (ref 150–400)
RBC: 4.01 MIL/uL (ref 3.87–5.11)
RDW: 14.5 % (ref 11.5–15.5)
WBC: 8.5 10*3/uL (ref 4.0–10.5)

## 2017-08-01 LAB — BRAIN NATRIURETIC PEPTIDE: B Natriuretic Peptide: 14.6 pg/mL (ref 0.0–100.0)

## 2017-08-01 MED ORDER — DIAZEPAM 5 MG PO TABS: 5.0000 mg | ORAL_TABLET | Freq: Every day | ORAL | Status: DC | PRN

## 2017-08-01 MED ORDER — FUROSEMIDE 10 MG/ML IJ SOLN
40.0000 mg | Freq: Once | INTRAMUSCULAR | Status: AC
  Administered 2017-08-01: 40 mg via INTRAVENOUS
  Filled 2017-08-01: qty 4

## 2017-08-01 MED ORDER — ROSUVASTATIN CALCIUM 20 MG PO TABS
20.0000 mg | ORAL_TABLET | Freq: Every day | ORAL | Status: DC
  Administered 2017-08-02: 20 mg via ORAL
  Filled 2017-08-01: qty 1

## 2017-08-01 MED ORDER — PANTOPRAZOLE SODIUM 40 MG PO TBEC
40.0000 mg | DELAYED_RELEASE_TABLET | Freq: Every day | ORAL | Status: DC
  Administered 2017-08-02 – 2017-08-03 (×2): 40 mg via ORAL
  Filled 2017-08-01 (×2): qty 1

## 2017-08-01 MED ORDER — HEPARIN SODIUM (PORCINE) 5000 UNIT/ML IJ SOLN
5000.0000 [IU] | Freq: Three times a day (TID) | INTRAMUSCULAR | Status: DC
  Administered 2017-08-02 – 2017-08-03 (×4): 5000 [IU] via SUBCUTANEOUS
  Filled 2017-08-01 (×4): qty 1

## 2017-08-01 MED ORDER — PRAMIPEXOLE DIHYDROCHLORIDE 0.25 MG PO TABS
0.2500 mg | ORAL_TABLET | ORAL | Status: DC
  Administered 2017-08-02: 0.25 mg via ORAL
  Filled 2017-08-01 (×2): qty 1

## 2017-08-01 MED ORDER — SODIUM CHLORIDE 0.9 % IV SOLN: 250.0000 mL | INTRAVENOUS | Status: DC | PRN

## 2017-08-01 MED ORDER — SODIUM CHLORIDE 0.9% FLUSH
3.0000 mL | Freq: Two times a day (BID) | INTRAVENOUS | Status: DC
  Administered 2017-08-02 – 2017-08-03 (×4): 3 mL via INTRAVENOUS

## 2017-08-01 MED ORDER — SODIUM CHLORIDE 0.9% FLUSH: 3.0000 mL | INTRAVENOUS | Status: DC | PRN

## 2017-08-01 MED ORDER — DIPHENHYDRAMINE HCL 25 MG PO CAPS
50.0000 mg | ORAL_CAPSULE | Freq: Every evening | ORAL | Status: DC | PRN
  Administered 2017-08-02: 50 mg via ORAL
  Filled 2017-08-01: qty 2

## 2017-08-01 MED ORDER — INSULIN ASPART 100 UNIT/ML ~~LOC~~ SOLN
0.0000 [IU] | Freq: Three times a day (TID) | SUBCUTANEOUS | Status: DC
  Administered 2017-08-02 – 2017-08-03 (×2): 1 [IU] via SUBCUTANEOUS

## 2017-08-01 MED ORDER — CLOPIDOGREL BISULFATE 75 MG PO TABS
75.0000 mg | ORAL_TABLET | Freq: Every day | ORAL | Status: DC
  Administered 2017-08-02 – 2017-08-03 (×2): 75 mg via ORAL
  Filled 2017-08-01 (×2): qty 1

## 2017-08-01 MED ORDER — GABAPENTIN 100 MG PO CAPS
200.0000 mg | ORAL_CAPSULE | Freq: Every day | ORAL | Status: DC
  Administered 2017-08-02 (×2): 200 mg via ORAL
  Filled 2017-08-01 (×2): qty 2

## 2017-08-01 MED ORDER — BUSPIRONE HCL 5 MG PO TABS
7.5000 mg | ORAL_TABLET | Freq: Every day | ORAL | Status: DC
  Administered 2017-08-02 (×2): 7.5 mg via ORAL
  Filled 2017-08-01: qty 2
  Filled 2017-08-01: qty 1

## 2017-08-01 MED ORDER — ALLOPURINOL 100 MG PO TABS
100.0000 mg | ORAL_TABLET | Freq: Every day | ORAL | Status: DC
Start: 2017-08-02 — End: 2017-08-03
  Administered 2017-08-02 – 2017-08-03 (×2): 100 mg via ORAL
  Filled 2017-08-01 (×3): qty 1

## 2017-08-01 MED ORDER — VITAMIN D 1000 UNITS PO TABS
2000.0000 [IU] | ORAL_TABLET | Freq: Every day | ORAL | Status: DC
Start: 2017-08-02 — End: 2017-08-03
  Administered 2017-08-02 – 2017-08-03 (×2): 2000 [IU] via ORAL
  Filled 2017-08-01 (×2): qty 2

## 2017-08-01 MED ORDER — METOPROLOL SUCCINATE ER 25 MG PO TB24
25.0000 mg | ORAL_TABLET | Freq: Every day | ORAL | Status: DC
  Administered 2017-08-02 – 2017-08-03 (×2): 25 mg via ORAL
  Filled 2017-08-01 (×3): qty 1

## 2017-08-01 MED ORDER — EXEMESTANE 25 MG PO TABS
25.0000 mg | ORAL_TABLET | Freq: Every day | ORAL | Status: DC
  Administered 2017-08-02 – 2017-08-03 (×2): 25 mg via ORAL
  Filled 2017-08-01 (×3): qty 1

## 2017-08-01 MED ORDER — ACETAMINOPHEN 325 MG PO TABS
650.0000 mg | ORAL_TABLET | ORAL | Status: DC | PRN
  Administered 2017-08-02: 650 mg via ORAL
  Filled 2017-08-01: qty 2

## 2017-08-01 MED ORDER — INSULIN ASPART 100 UNIT/ML ~~LOC~~ SOLN: 0.0000 [IU] | Freq: Every day | SUBCUTANEOUS | Status: DC

## 2017-08-01 MED ORDER — ONDANSETRON HCL 4 MG/2ML IJ SOLN: 4.0000 mg | Freq: Four times a day (QID) | INTRAMUSCULAR | Status: DC | PRN

## 2017-08-01 MED ORDER — FUROSEMIDE 10 MG/ML IJ SOLN
40.0000 mg | Freq: Two times a day (BID) | INTRAMUSCULAR | Status: DC
  Administered 2017-08-02 – 2017-08-03 (×3): 40 mg via INTRAVENOUS
  Filled 2017-08-01 (×3): qty 4

## 2017-08-01 NOTE — ED Triage Notes (Signed)
Pt sent by MD Ellyn Hack for admission through the ED for CHF exacerbation with bilateral edema. Pt upset that she had to come through the ED. significant edema noted to legs with weeping and redness. Pt endorses fatigue and shortness of breath. No chest pain.

## 2017-08-01 NOTE — Telephone Encounter (Signed)
Pt came in for a nurse visit to further evaluate her bilateral leg edema. Per [name] pt was to increase Bumex x 4 days and if weight continue to increase then to report to hospital for IV diuretic. Pt verbalized understanding.

## 2017-08-01 NOTE — ED Notes (Signed)
Provider at bedside

## 2017-08-01 NOTE — ED Provider Notes (Signed)
[place] EMERGENCY DEPARTMENT Provider Note   CSN: [id number] Arrival date & time: 08/01/17  1242     History   Chief Complaint Chief Complaint  Patient presents with  . Congestive Heart Failure  . Leg Swelling    HPI [name] is a 77 y.o. female.  HPI Patient is a 77 year old female with history of CKD, CHF with preserved EF, diabetes, and hypertension, who presents with bilateral leg swelling for the last 4 weeks.  She reports that she is about 25 pounds over her dry weight.  The swelling has been worse over the last week.  She spoke to her cardiologist and has increased her Bumex for the last 4 days.  She reports very little improvement.  She denies any chest pain or shortness of breath, however she does report getting extremely fatigued with any exertion.  She reports that she went to her cardiologist office this morning for a blood draw and was told by the nurse to come here for diuresis.  She continues to make urine but has to self cath at home.  She denies any fever, chills, cough, or other infectious symptoms.  Past Medical History:  Diagnosis Date  . Anxiety   . Arthritis     osteoarthritis  . Breast cancer in female [facility])    Bilateral  . CAD S/P percutaneous coronary angioplasty 2007; March 2011   [employer] 3.0 mm 20 mm postdilated 3.6 mm in early mid LAD; status post ISR Cutting Balloon PTCA and March 11 along with PCI of distal mid lesion with a 3.0 mm 12 mm MultiLink vision BMS; the proximal stent causes jailing of SP1 and SP2 with ostial 70-80% lesions  . Cancer (HCC)    Melanoma, Squamous cell Carcinoma  . CHF (congestive heart failure) ([place])   . Chronic kidney disease    stage 2   . Dementia    mild  . Diabetes mellitus type 2 with neurological manifestations ([place])   . Dyslipidemia, goal LDL below 70   . Full dentures   . Genetic testing 12/03/2016   Germline genetic testing was performed through [employer] Common Hereditary Cancers  Panel + [employer] Melanoma Panel. This custom panel includes analysis of the following 51 genes: APC, ATM, AXIN2, BAP1, BARD1, BMPR1A, BRCA1, BRCA2, BRIP1, CDH1, CDK4, CDKN2A, CHEK2, CTNNA1, DICER1, EPCAM, GREM1, HOXB13, KIT, MEN1, MITF, MLH1, MSH2, MSH3, MSH6, MUTYH, NBN, NF1, NTHL1, PALB2, PDGFRA, PMS2, POLD1, POL  . GERD (gastroesophageal reflux disease)   . Headache(784.0)   . History of hematuria    Followed by Dr. Gaynelle Arabian  . Hypertension, essential, benign   . Nausea & vomiting 10/2015  . Peripheral neuropathy   . Stroke ([place])   . TIA (transient ischemic attack)    multiple in the past  . Wears glasses     Patient Active Problem List   Diagnosis Date Noted  . Acute on chronic diastolic CHF (congestive heart failure) ([place]) 08/01/2017  . Genetic testing 12/03/2016  . 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 Malignant neoplasm of upper-outer quadrant of left breast in female, estrogen receptor positive ([place]) 11/19/2016  . Chronic idiopathic gout involving toe of left foot without tophus 11/15/2016  . Malignant 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, estrogen receptor positive ([place]) 11/09/2016  . Tendon tear, ankle, left, sequela   . Traumatic rupture of left anterior tibial tendon 04/20/2016  . Acute on chronic kidney failure ([place])   . Uncontrolled type 2 diabetes mellitus with  complication ([place])   . Headache, migraine   . Unexplained weight loss 11/17/2015  . Acute kidney injury superimposed on chronic kidney disease ([place]) 11/17/2015  . Restless legs syndrome 07/24/2015  . Mild cognitive impairment 01/08/2015  . Abnormality of gait 01/08/2015  . Dizziness 12/19/2014  . Preoperative cardiovascular examination 12/11/2013  . GERD (gastroesophageal reflux disease) 10/06/2013  . Syncope 10/05/2013  . Dementia 12/06/2012  . Chest pain at rest 10/28/2012  . Anxiety 10/28/2012    Class: Acute  . Edema of both legs 10/28/2012  . CAD S/P percutaneous coronary  angioplasty   . Essential hypertension   . Dyslipidemia, goal LDL below 70     Past Surgical History:  Procedure Laterality Date  . ABDOMINAL HYSTERECTOMY    . APPENDECTOMY    . BLADDER SUSPENSION    . BREAST LUMPECTOMY WITH RADIOACTIVE SEED AND SENTINEL LYMPH NODE BIOPSY Bilateral 12/14/2016   Procedure: BILATERAL BREAST LUMPECTOMY WITH BILATERAL  RADIOACTIVE SEED AND BILATERAL AXILLARY  SENTINEL LYMPH NODE BIOPSY ERAS PATHWAY;  Surgeon: Alphonsa Overall, MD;  Location: [facility];  Service: General;  Laterality: Bilateral;  PECTORAL BLOCK  . CARDIAC CATHETERIZATION  02/24/2006   85% stenosis in the proximal portion of LAD-arrangements made for PCI on 02/25/2006  . CARDIAC CATHETERIZATION  02/25/2006   80% LAD lesion stented with a 3x57m Liberte stent resulting in reduction of 80% lesion to 0% residual  . CARDIAC CATHETERIZATION  04/26/2006   Medical management  . CARDIAC CATHETERIZATION  06/24/2009   60-70% re-stenosis in the proximal LAD. A 3.25x15 cutting balloon, 3 inflations - 14atm-38sec, 13atm-39sec, and 12atm-40sec reduced to less than 10%. 60% stenosis of the mid/distal LAD stented with a 3x177mMultilink stent.  . Marland KitchenARDIAC CATHETERIZATION  07/09/2009   Medical management  . CAROTID DOPPLER  06/24/2009   40-59% R ICA stenosis. No significant ICA stenosis noted  . CHOLECYSTECTOMY    . DILATION AND CURETTAGE OF UTERUS    . JOINT REPLACEMENT Left   . LESION REMOVAL Right 03/11/2015   Procedure:  EXCISION OF RIGHT PRE TIBIAL LESION;  Surgeon: JaJudeth HornMD;  [place] [facility] Service: General;  Laterality: Right;  . MASS EXCISION Left 06/10/2014   Procedure: EXCISION LEFT LOWER LEG LESION;  Surgeon: JaDoreen SalvageMD;  [place] Service: General;  Laterality: Left;  . Marland KitchenASS EXCISION Left 09/05/2015   Procedure: EXCISION OF LEFT FOREARM  MASS;  Surgeon: JaJudeth HornMD;  [place] [facility] Service: General;  Laterality: Left;  . NM  MYOVIEW LTD  11/16/2011   5 beats a PVC during recovery.  No skin or infarction.  Reached 5 METs, EKG negative for ischemia   . SHOULDER ARTHROSCOPY  10/15  . SHOULDER ARTHROSCOPY  1995   left  . TENDON REPAIR Left 05/12/2016   Procedure: Left Anterior Tibial Tendon Reconstruction;  Surgeon: MaNewt MinionMD;  [place] [facility] Service: Orthopedics;  Laterality: Left;  . TOTAL KNEE ARTHROPLASTY  2005   left  . TRANSTHORACIC ECHOCARDIOGRAM  10/2015   EF 55-60 %. Mild LVH. Mild pulmonary hypertension. Normal valves.  . TRANSTHORACIC ECHOCARDIOGRAM  12/31/2009/July 2015   IsLV size & function, Gr 1 DD w/ mild TR.;; b)09/2013: 55-60%. Grade 1 DD, mild aortic sclerosis  . WRIST ARTHROPLASTY  2010   cancer lt wrist     OB History   None      Home Medications    Prior to Admission medications  Medication Sig Start Date End Date Taking? Authorizing Provider  allopurinol (ZYLOPRIM) 100 MG tablet Take 1 tablet (100 mg total) by mouth daily. 11/15/16  Yes Newt Minion, MD  Biotin w/ Vitamins C & E (HAIR SKIN & NAILS GUMMIES PO) Take 2 tablets by mouth daily.    Yes [provider]  bumetanide (BUMEX) 2 MG tablet Take 2 mg by mouth daily.  08/26/16  Yes [provider]  busPIRone (BUSPAR) 7.5 MG tablet Take 7.5 mg by mouth at bedtime.  11/05/16  Yes [provider]  Calcium Carb-Cholecalciferol (CALCIUM 600 + D PO) Take 1 tablet by mouth at bedtime.    Yes [provider]  Cholecalciferol 2000 units CAPS Take 2,000 Units by mouth daily.   Yes [provider]  clopidogrel (PLAVIX) 75 MG tablet Take 1 tablet (75 mg total) by mouth daily. Due for f/u appt, please call office 07/19/17  Yes Leonie Man, MD  COD LIVER OIL PO Take 415 mg by mouth daily.   Yes [provider]  colchicine 0.6 MG tablet Take one tablet daily during acute gout attack for 3 days, discontinue after this. Resume in the event of another acute gout flare up. 11/15/16  Yes  Newt Minion, MD  CRANBERRY PO Take 1 capsule by mouth daily.   Yes [provider]  diazepam (VALIUM) 5 MG tablet Take 5 mg by mouth daily as needed for anxiety. 07/20/17  Yes [provider]  diphenhydrAMINE (BENADRYL) 50 MG capsule Take 50 mg by mouth at bedtime as needed for sleep.    Yes [provider]  exemestane (AROMASIN) 25 MG tablet Take 1 tablet (25 mg total) daily after breakfast by mouth. 02/07/17  Yes Truitt Merle, MD  Fish Oil-Krill Oil (KRILL OIL PLUS PO) Take 1 capsule by mouth daily.    Yes [provider]  gabapentin (NEURONTIN) 300 MG capsule Take 600 mg by mouth at bedtime.    Yes [provider]  Iron-FA-B Cmp-C-Biot-Probiotic (FUSION PLUS) CAPS takes 1 tablet daily 01/08/16  Yes [provider]  losartan (COZAAR) 100 MG tablet take 1 tablet daily 12/16/15  Yes [provider]  Melatonin 5 MG CAPS Take 5 mg by mouth at bedtime as needed (sleep).    Yes [provider]  Methylfol-Methylcob-Acetylcyst (CEREFOLIN NAC) 6-2-600 MG TABS TAKE 1 CAPLET BY MOUTH ONCE DAILY. 07/05/17  Yes Garvin Fila, MD  metoprolol succinate (TOPROL XL) 50 MG 24 hr tablet TAKE ONE-HALF (1/2) TABLET DAILY 07/07/17  Yes Leonie Man, MD  nitroGLYCERIN (NITROLINGUAL) 0.4 MG/SPRAY spray Place 1 spray under the tongue every 5 (five) minutes x 3 doses as needed for chest pain.    Yes Leonie Man, MD  pantoprazole (PROTONIX) 40 MG tablet TAKE 1 TABLET DAILY (CONTACT OFFICE FOR ADDITIONAL REFILLS, FINAL ATTEMPT) 02/02/16  Yes Leonie Man, MD  pramipexole (MIRAPEX) 0.25 MG tablet TAKE 1 TABLET AT 4:00 P.M. AND 2 TABLETS (0.5 MG) AT BEDTIME 04/18/17  Yes Garvin Fila, MD  Resveratrol (RESERVAPAK PO) Take 1 tablet by mouth every evening.    Yes [provider]  rosuvastatin (CRESTOR) 20 MG tablet Take 20 mg by mouth at bedtime.   Yes [provider]    Family History Family History  Problem Relation Age of  Onset  . Diabetes Mother   . Hypertension Mother   . Kidney disease Mother   . Hypertension Father   . Emphysema Father   .  Heart attack Father   . Heart disease Father   . Arthritis Sister   . Diabetes Sister   . Hypertension Sister   . Breast cancer Sister 6       treated with mastectomy  . Cervical cancer Sister 92  . Hypertension Brother   . Hyperlipidemia Brother   . Diabetes Brother   . Stroke Brother   . Diabetes Sister   . Hypertension Sister   . Stomach cancer Sister 57  . Hypertension Brother   . ALS Brother        d.62s  . Breast cancer Daughter 81       triple negative treated with lumpectomy, chemo, radiation  . Heart attack Daughter 76       d.45  . COPD Daughter   . Cancer Paternal Aunt        unspecified type  . Cancer Paternal Uncle        unspecified type  . Cancer Paternal Uncle        unspecified type    Social History Social History   Tobacco Use  . Smoking status: Never Smoker  . Smokeless tobacco: Never Used  Substance Use Topics  . Alcohol use: No  . Drug use: No     Allergies   Lipitor [atorvastatin]; Lyrica [pregabalin]; Nsaids; Reglan [metoclopramide]; and Ultram [tramadol]   Review of Systems Review of Systems  Constitutional: Positive for fatigue. Negative for chills and fever.  HENT: Negative for ear pain and sore throat.   Eyes: Negative for pain and visual disturbance.  Respiratory: Negative for cough and shortness of breath.   Cardiovascular: Positive for leg swelling. Negative for chest pain and palpitations.  Gastrointestinal: Negative for abdominal pain and vomiting.  Genitourinary: Negative for dysuria and hematuria.  Musculoskeletal: Negative for arthralgias and back pain.  Skin: Negative for color change and rash.  Neurological: Negative for seizures and syncope.  All other systems reviewed and are negative.    Physical Exam Updated Vital Signs BP 106/60   Pulse 93   Temp 98.1 F (36.7 C) (Oral)   Resp  (!) 26   SpO2 97%   Physical Exam  Constitutional: She appears well-developed and well-nourished. No distress.  HENT:  Head: Normocephalic and atraumatic.  Eyes: Conjunctivae are normal.  Neck: Neck supple.  Cardiovascular: Normal rate and regular rhythm.  No murmur heard. Pulmonary/Chest: Effort normal and breath sounds normal. No respiratory distress.  Abdominal: Soft. There is no tenderness.  Musculoskeletal: She exhibits edema. She exhibits no tenderness.  2+ pitting edema to bilateral lower extremities from the feet up to the knee.  The edema is symmetric.  There is no significant tenderness.  Neurological: She is alert.  Skin: Skin is warm and dry. There is erythema.  There is erythema to bilateral lower extremities from the ankle to the midcalf.  These are consistent with chronic venous stasis changes.  Psychiatric: She has a normal mood and affect.  Nursing note and vitals reviewed.    ED Treatments / Results  Labs (all labs ordered are listed, but only abnormal results are displayed) Labs Reviewed  BASIC METABOLIC PANEL - Abnormal; Notable for the following components:      Result Value   Chloride 97 (*)    Glucose, Bld 220 (*)    BUN 25 (*)    Creatinine, Ser 1.94 (*)    GFR calc non Af Amer 24 (*)    GFR calc Af Amer 27 (*)  All other components within normal limits  CBC  BRAIN NATRIURETIC PEPTIDE  BASIC METABOLIC PANEL  I-STAT TROPONIN, ED    EKG EKG Interpretation  Date/Time:  Monday Aug 01 2017 13:34:18 EDT Ventricular Rate:  86 PR Interval:  180 QRS Duration: 96 QT Interval:  376 QTC Calculation: 449 R Axis:   57 Text Interpretation:  Normal sinus rhythm Cannot rule out Anterior infarct , age undetermined Abnormal ECG since last tracing no significant change Confirmed by Noemi Chapel [phone]) on 08/01/2017 9:19:30 PM   Radiology Dg Chest 2 View  Result Date: 08/01/2017 CLINICAL DATA:  Fatigue and leg swelling for 1 day. Stage III renal failure.  EXAM: CHEST - 2 VIEW COMPARISON:  06/09/2017 FINDINGS: Cardiomediastinal silhouette is normal. Mediastinal contours appear intact. Tortuosity and calcific atherosclerotic disease of the aorta. There is no evidence of focal airspace consolidation, pleural effusion or pneumothorax. Osseous structures are without acute abnormality. Soft tissues are grossly normal. IMPRESSION: No active cardiopulmonary disease. Electronically Signed   By: Fidela Salisbury M.D.   On: 08/01/2017 14:11    Procedures Procedures (including critical care time)  Medications Ordered in ED Medications  allopurinol (ZYLOPRIM) tablet 100 mg (has no administration in time range)  busPIRone (BUSPAR) tablet 7.5 mg (has no administration in time range)  Cholecalciferol CAPS 2,000 Units (has no administration in time range)  clopidogrel (PLAVIX) tablet 75 mg (has no administration in time range)  diazepam (VALIUM) tablet 5 mg (has no administration in time range)  diphenhydrAMINE (BENADRYL) capsule 50 mg (has no administration in time range)  exemestane (AROMASIN) tablet 25 mg (has no administration in time range)  gabapentin (NEURONTIN) capsule 200 mg (has no administration in time range)  metoprolol succinate (TOPROL-XL) 24 hr tablet 25 mg (has no administration in time range)  pantoprazole (PROTONIX) EC tablet 40 mg (has no administration in time range)  pramipexole (MIRAPEX) tablet 0.25 mg (has no administration in time range)  rosuvastatin (CRESTOR) tablet 20 mg (has no administration in time range)  sodium chloride flush (NS) 0.9 % injection 3 mL (has no administration in time range)  sodium chloride flush (NS) 0.9 % injection 3 mL (has no administration in time range)  0.9 %  sodium chloride infusion (has no administration in time range)  acetaminophen (TYLENOL) tablet 650 mg (has no administration in time range)  ondansetron (ZOFRAN) injection 4 mg (has no administration in time range)  heparin injection 5,000 Units  (has no administration in time range)  furosemide (LASIX) injection 40 mg (has no administration in time range)  insulin aspart (novoLOG) injection 0-9 Units (has no administration in time range)  insulin aspart (novoLOG) injection 0-5 Units (has no administration in time range)  furosemide (LASIX) injection 40 mg (40 mg Intravenous Given 08/01/17 2211)     Initial Impression / Assessment and Plan / ED Course  I have reviewed the triage vital signs and the nursing notes.  Pertinent labs & imaging results that were available during my care of the patient were reviewed by me and considered in my medical decision making (see chart for details).     Patient is a 77 year old female who presents with 4 weeks of worsening lower extremity edema.  She has increased her p.o. diuretic recently with minimal response.  She is having fatigue with exertion but denies any significant chest pain or shortness of breath.  Her labs are notable for mildly increased creatinine from her recent baseline.  She is still within her overall baseline range, however  this may represent some recent intravascular depletion.  Chest x-Kleinpeter also unremarkable.  She does have significant peripheral edema.  I discussed outpatient versus inpatient management with the patient and her family.  They feel very uncomfortable going home at this point given that she has failed to improve significantly with her increased diuretic at home.  Given that she is 25 pounds over her dry weight with the clinical findings here, we will admit her to hospitalist for further management.  I have given her a dose of IV Lasix here.  Foley catheter placed.  Final Clinical Impressions(s) / ED Diagnoses   Final diagnoses:  Acute on chronic congestive heart failure, unspecified heart failure type [facility])    ED Discharge Orders    None       Clifton James, MD 08/01/17 Matt Holmes    Noemi Chapel, MD 08/03/17 [phone]

## 2017-08-01 NOTE — ED Provider Notes (Signed)
The patient is a 77 year old female who presents with a chief complaint of increased leg swelling and mild shortness of breath.  She was seen at her cardiologist's office and was told to come for IV diuresis as she has been taking increased amounts of diuretics without significant improvement.  On exam the patient does not fact have bilateral lower extremity edema.  I do not see any signs of redness induration or warmth of the legs.  Her lungs have very subtle rales but overall she is in no distress, speaks in full sentences.  Heart exam is unremarkable, no murmurs.  Abdomen soft and nontender.  I saw and evaluated the patient, reviewed the resident's note and I agree with the findings and plan.   EKG Interpretation  Date/Time:  Monday Aug 01 2017 13:34:18 EDT Ventricular Rate:  86 PR Interval:  180 QRS Duration: 96 QT Interval:  376 QTC Calculation: 449 R Axis:   57 Text Interpretation:  Normal sinus rhythm Cannot rule out Anterior infarct , age undetermined Abnormal ECG since last tracing no significant change Confirmed by Noemi Chapel [phone]) on 08/01/2017 9:19:30 PM       I personally interpreted the EKG as well as the resident and agree with the interpretation on the resident's chart.  Final diagnoses:  Acute on chronic congestive heart failure, unspecified heart failure type [facility])      Noemi Chapel, MD 08/03/17 [phone]

## 2017-08-01 NOTE — H&P (Signed)
History and Physical    TERRY ABILA VZD:[id number] DOB: 01-Oct-1940 DOA: 08/01/2017  PCP: Redmond School, MD   Patient coming from: Home  Chief Complaint: Bilateral leg edema, DOE  HPI: [name] is a 77 y.o. female with medical history significant for coronary artery disease, type 2 diabetes mellitus, and chronic diastolic CHF, presenting to the emergency department for evaluation of weight gain, bilateral lower extremity swelling, and exertional dyspnea symptoms have been progressive over.  Recent weeks, and particularly over the past 1 week.  She had her diuretic increased by her cardiologist several days ago, but symptoms have continued to progress despite this.  She denies any chest pain or palpitations and denies fevers or chills.  She reports symmetric bilateral lower extremity swelling with recent bulla formation and serous weeping.  ED Course: Upon arrival to the ED, patient is found to be afebrile, saturating adequately on room air, and with vitals otherwise stable.  EKG features a sinus rhythm and chest x-Faucett is negative for acute cardiopulmonary disease.  Chemistry panel is notable for creatinine 1.97, up from 1.56 in January.  CBC is unremarkable, troponin is undetectable, and BNP is normal.  Patient was given 40 mg IV Lasix in the ED, has begun to diurese, and will be admitted for ongoing evaluation and management of suspected acute on chronic diastolic CHF.  Review of Systems:  All other systems reviewed and apart from HPI, are negative.  Past Medical History:  Diagnosis Date  . Anxiety   . Arthritis     osteoarthritis  . Breast cancer in female [facility])    Bilateral  . CAD S/P percutaneous coronary angioplasty 2007; March 2011   [facility] [facility] 3.0 mm 20 mm postdilated 3.6 mm in early mid LAD; status post ISR Cutting Balloon PTCA and March 11 along with PCI of distal mid lesion with a 3.0 mm 12 mm MultiLink vision BMS; the proximal stent causes jailing of SP1 and SP2 with ostial  70-80% lesions  . Cancer (HCC)    Melanoma, Squamous cell Carcinoma  . CHF (congestive heart failure) ([place])   . Chronic kidney disease    stage 2   . Dementia    mild  . Diabetes mellitus type 2 with neurological manifestations ([place])   . Dyslipidemia, goal LDL below 70   . Full dentures   . Genetic testing 12/03/2016   Germline genetic testing was performed through [employer] Common Hereditary Cancers Panel + [facility]'s Melanoma Panel. This custom panel includes analysis of the following 51 genes: APC, ATM, AXIN2, BAP1, BARD1, BMPR1A, BRCA1, BRCA2, BRIP1, CDH1, CDK4, CDKN2A, CHEK2, CTNNA1, DICER1, EPCAM, GREM1, HOXB13, KIT, MEN1, MITF, MLH1, MSH2, MSH3, MSH6, MUTYH, NBN, NF1, NTHL1, PALB2, PDGFRA, PMS2, POLD1, POL  . GERD (gastroesophageal reflux disease)   . Headache(784.0)   . History of hematuria    Followed by Dr. Gaynelle Arabian  . Hypertension, essential, benign   . Nausea & vomiting 10/2015  . Peripheral neuropathy   . Stroke ([place])   . TIA (transient ischemic attack)    multiple in the past  . Wears glasses     Past Surgical History:  Procedure Laterality Date  . ABDOMINAL HYSTERECTOMY    . APPENDECTOMY    . BLADDER SUSPENSION    . BREAST LUMPECTOMY WITH RADIOACTIVE SEED AND SENTINEL LYMPH NODE BIOPSY Bilateral 12/14/2016   Procedure: BILATERAL BREAST LUMPECTOMY WITH BILATERAL  RADIOACTIVE SEED AND BILATERAL AXILLARY  SENTINEL LYMPH NODE BIOPSY ERAS PATHWAY;  Surgeon: Alphonsa Overall, MD;  Location:  [facility] OR;  Service: General;  Laterality: Bilateral;  PECTORAL BLOCK  . CARDIAC CATHETERIZATION  02/24/2006   85% stenosis in the proximal portion of LAD-arrangements made for PCI on 02/25/2006  . CARDIAC CATHETERIZATION  02/25/2006   80% LAD lesion stented with a 3x41m Liberte stent resulting in reduction of 80% lesion to 0% residual  . CARDIAC CATHETERIZATION  04/26/2006   Medical management  . CARDIAC CATHETERIZATION  06/24/2009   60-70% re-stenosis in the proximal LAD. A 3.25x15 cutting  balloon, 3 inflations - 14atm-38sec, 13atm-39sec, and 12atm-40sec reduced to less than 10%. 60% stenosis of the mid/distal LAD stented with a 3x146mMultilink stent.  . Marland KitchenARDIAC CATHETERIZATION  07/09/2009   Medical management  . CAROTID DOPPLER  06/24/2009   40-59% R ICA stenosis. No significant ICA stenosis noted  . CHOLECYSTECTOMY    . DILATION AND CURETTAGE OF UTERUS    . JOINT REPLACEMENT Left   . LESION REMOVAL Right 03/11/2015   Procedure:  EXCISION OF RIGHT PRE TIBIAL LESION;  Surgeon: JaJudeth HornMD;  [place] Service: General;  Laterality: Right;  . MASS EXCISION Left 06/10/2014   Procedure: EXCISION LEFT LOWER LEG LESION;  Surgeon: JaDoreen SalvageMD;  [place] [facility] Service: General;  Laterality: Left;  . Marland KitchenASS EXCISION Left 09/05/2015   Procedure: EXCISION OF LEFT FOREARM  MASS;  Surgeon: JaJudeth HornMD;  [place] Service: General;  Laterality: Left;  . NM MYOVIEW LTD  11/16/2011   5 beats a PVC during recovery.  No skin or infarction.  Reached 5 METs, EKG negative for ischemia   . SHOULDER ARTHROSCOPY  10/15  . SHOULDER ARTHROSCOPY  1995   left  . TENDON REPAIR Left 05/12/2016   Procedure: Left Anterior Tibial Tendon Reconstruction;  Surgeon: MaNewt MinionMD;  [place] Service: Orthopedics;  Laterality: Left;  . TOTAL KNEE ARTHROPLASTY  2005   left  . TRANSTHORACIC ECHOCARDIOGRAM  10/2015   EF 55-60 %. Mild LVH. Mild pulmonary hypertension. Normal valves.  . TRANSTHORACIC ECHOCARDIOGRAM  12/31/2009/July 2015   IsLV size & function, Gr 1 DD w/ mild TR.;; b)09/2013: 55-60%. Grade 1 DD, mild aortic sclerosis  . WRIST ARTHROPLASTY  2010   cancer lt wrist     reports that she has never smoked. She has never used smokeless tobacco. She reports that she does not drink alcohol or use drugs.  Allergies  Allergen Reactions  . Lipitor [Atorvastatin] Other (See Comments)    Bone and muscle pain  .  Lyrica [Pregabalin]     Weight gain, extremity swelling  . Nsaids Swelling  . Reglan [Metoclopramide]     Tardive dyskinesia; paradoxical reaction not relieved by benadryl  . Ultram [Tramadol] Other (See Comments)    Pt has seizure activity with this medication    Family History  Problem Relation Age of Onset  . Diabetes Mother   . Hypertension Mother   . Kidney disease Mother   . Hypertension Father   . Emphysema Father   . Heart attack Father   . Heart disease Father   . Arthritis Sister   . Diabetes Sister   . Hypertension Sister   . Breast cancer Sister 3088     treated with mastectomy  . Cervical cancer Sister 2076. Hypertension Brother   . Hyperlipidemia Brother   . Diabetes Brother   . Stroke Brother   . Diabetes Sister   .  Hypertension Sister   . Stomach cancer Sister 79  . Hypertension Brother   . ALS Brother        d.62s  . Breast cancer Daughter 80       triple negative treated with lumpectomy, chemo, radiation  . Heart attack Daughter 40       d.45  . COPD Daughter   . Cancer Paternal Aunt        unspecified type  . Cancer Paternal Uncle        unspecified type  . Cancer Paternal Uncle        unspecified type     Prior to Admission medications   Medication Sig Start Date End Date Taking? Authorizing Provider  allopurinol (ZYLOPRIM) 100 MG tablet Take 1 tablet (100 mg total) by mouth daily. 11/15/16  Yes Newt Minion, MD  Biotin w/ Vitamins C & E (HAIR SKIN & NAILS GUMMIES PO) Take 2 tablets by mouth daily.    Yes [provider]  bumetanide (BUMEX) 2 MG tablet Take 2 mg by mouth daily.  08/26/16  Yes [provider]  busPIRone (BUSPAR) 7.5 MG tablet Take 7.5 mg by mouth at bedtime.  11/05/16  Yes [provider]  Calcium Carb-Cholecalciferol (CALCIUM 600 + D PO) Take 1 tablet by mouth at bedtime.    Yes [provider]  Cholecalciferol 2000 units CAPS Take 2,000 Units by mouth daily.   Yes [provider]    clopidogrel (PLAVIX) 75 MG tablet Take 1 tablet (75 mg total) by mouth daily. Due for f/u appt, please call office 07/19/17  Yes Leonie Man, MD  COD LIVER OIL PO Take 415 mg by mouth daily.   Yes [provider]  colchicine 0.6 MG tablet Take one tablet daily during acute gout attack for 3 days, discontinue after this. Resume in the event of another acute gout flare up. 11/15/16  Yes Newt Minion, MD  CRANBERRY PO Take 1 capsule by mouth daily.   Yes [provider]  diazepam (VALIUM) 5 MG tablet Take 5 mg by mouth daily as needed for anxiety. 07/20/17  Yes [provider]  diphenhydrAMINE (BENADRYL) 50 MG capsule Take 50 mg by mouth at bedtime as needed for sleep.    Yes [provider]  exemestane (AROMASIN) 25 MG tablet Take 1 tablet (25 mg total) daily after breakfast by mouth. 02/07/17  Yes Truitt Merle, MD  Fish Oil-Krill Oil (KRILL OIL PLUS PO) Take 1 capsule by mouth daily.    Yes [provider]  gabapentin (NEURONTIN) 300 MG capsule Take 600 mg by mouth at bedtime.    Yes [provider]  Iron-FA-B Cmp-C-Biot-Probiotic (FUSION PLUS) CAPS takes 1 tablet daily 01/08/16  Yes [provider]  losartan (COZAAR) 100 MG tablet take 1 tablet daily 12/16/15  Yes [provider]  Melatonin 5 MG CAPS Take 5 mg by mouth at bedtime as needed (sleep).    Yes [provider]  Methylfol-Methylcob-Acetylcyst (CEREFOLIN NAC) 6-2-600 MG TABS TAKE 1 CAPLET BY MOUTH ONCE DAILY. 07/05/17  Yes Garvin Fila, MD  metoprolol succinate (TOPROL XL) 50 MG 24 hr tablet TAKE ONE-HALF (1/2) TABLET DAILY 07/07/17  Yes Leonie Man, MD  nitroGLYCERIN (NITROLINGUAL) 0.4 MG/SPRAY spray Place 1 spray under the tongue every 5 (five) minutes x 3 doses as needed for chest pain.    Yes Leonie Man, MD  pantoprazole (PROTONIX) 40 MG tablet TAKE 1 TABLET DAILY (CONTACT OFFICE  FOR ADDITIONAL REFILLS, FINAL ATTEMPT) 02/02/16  Yes Leonie Man, MD  pramipexole (MIRAPEX) 0.25 MG tablet TAKE 1 TABLET AT 4:00 P.M. AND 2 TABLETS (0.5 MG) AT BEDTIME 04/18/17  Yes Garvin Fila, MD  Resveratrol (RESERVAPAK PO) Take 1 tablet by mouth every evening.    Yes [provider]  rosuvastatin (CRESTOR) 20 MG tablet Take 20 mg by mouth at bedtime.   Yes [provider]    Physical Exam: Vitals:   08/01/17 1324 08/01/17 1606 08/01/17 2130 08/01/17 2230  BP: (!) 122/52 137/83 (!) 154/95 106/60  Pulse: 85 83 94 93  Resp: '18 16 17 '$ (!) 26  Temp: 97.7 F (36.5 C) 98.1 F (36.7 C)    TempSrc: Oral Oral    SpO2: 100% 98% 100% 97%      Constitutional: NAD, calm, comfortable Eyes: PERTLA, lids and conjunctivae normal ENMT: Mucous membranes are moist. Posterior pharynx clear of any exudate or lesions.   Neck: normal, supple, no masses, no thyromegaly Respiratory: clear to auscultation bilaterally. No accessory muscle use.  Cardiovascular: S1 & S2 heard, regular rate and rhythm. Pretibial pitting edema bilaterally. Abdomen: No distension, no tenderness, no masses palpated. Bowel sounds normal.  Musculoskeletal: no clubbing / cyanosis. No joint deformity upper and lower extremities.    Skin: no significant rashes, lesions, ulcers. Warm, dry, well-perfused. Neurologic: CN 2-12 grossly intact. Sensation intact. Strength 5/5 in all 4 limbs.  Psychiatric: Alert and oriented x 3. Calm, cooperative.     Labs on Admission: I have personally reviewed following labs and imaging studies  CBC: Recent Labs  Lab 08/01/17 1329  WBC 8.5  HGB 12.1  HCT 37.6  MCV 93.8  PLT 314   Basic Metabolic Panel: Recent Labs  Lab 08/01/17 1329  NA 136  K 4.1  CL 97*  CO2 27  GLUCOSE 220*  BUN 25*  CREATININE 1.94*  CALCIUM 10.0   GFR: CrCl cannot be calculated (Unknown ideal weight.). Liver Function Tests: No results for input(s): AST, ALT, ALKPHOS, BILITOT, PROT, ALBUMIN in the last 168 hours. No results for input(s): LIPASE,  AMYLASE in the last 168 hours. No results for input(s): AMMONIA in the last 168 hours. Coagulation Profile: No results for input(s): INR, PROTIME in the last 168 hours. Cardiac Enzymes: No results for input(s): CKTOTAL, CKMB, CKMBINDEX, TROPONINI in the last 168 hours. BNP (last 3 results) No results for input(s): PROBNP in the last 8760 hours. HbA1C: No results for input(s): HGBA1C in the last 72 hours. CBG: No results for input(s): GLUCAP in the last 168 hours. Lipid Profile: No results for input(s): CHOL, HDL, LDLCALC, TRIG, CHOLHDL, LDLDIRECT in the last 72 hours. Thyroid Function Tests: No results for input(s): TSH, T4TOTAL, FREET4, T3FREE, THYROIDAB in the last 72 hours. Anemia Panel: No results for input(s): VITAMINB12, FOLATE, FERRITIN, TIBC, IRON, RETICCTPCT in the last 72 hours. Urine analysis:    Component Value Date/Time   COLORURINE YELLOW 11/17/2015 0209   APPEARANCEUR CLEAR 11/17/2015 0209   LABSPEC 1.012 11/17/2015 0209   PHURINE 5.5 11/17/2015 0209   GLUCOSEU NEGATIVE 11/17/2015 0209   HGBUR NEGATIVE 11/17/2015 0209   BILIRUBINUR NEGATIVE 11/17/2015 0209   KETONESUR NEGATIVE 11/17/2015 0209   PROTEINUR NEGATIVE 11/17/2015 0209   UROBILINOGEN 0.2 10/05/2013 0059   NITRITE NEGATIVE 11/17/2015 0209   LEUKOCYTESUR NEGATIVE 11/17/2015 0209   Sepsis Labs: '@LABRCNTIP'$ (procalcitonin:4,lacticidven:4) )No results found for this or any previous visit (from the past 240 hour(s)).   Radiological Exams on Admission: Dg  Chest 2 View  Result Date: 08/01/2017 CLINICAL DATA:  Fatigue and leg swelling for 1 day. Stage III renal failure. EXAM: CHEST - 2 VIEW COMPARISON:  06/09/2017 FINDINGS: Cardiomediastinal silhouette is normal. Mediastinal contours appear intact. Tortuosity and calcific atherosclerotic disease of the aorta. There is no evidence of focal airspace consolidation, pleural effusion or pneumothorax. Osseous structures are without acute abnormality. Soft tissues are  grossly normal. IMPRESSION: No active cardiopulmonary disease. Electronically Signed   By: Fidela Salisbury M.D.   On: 08/01/2017 14:11    EKG: Independently reviewed. Sinus rhythm.   Assessment/Plan   1. Acute on chronic diastolic CHF  - Presents with progressive leg swelling, DOE, and wt gain despite recent increase in diuretics as outpatient - Treated with Lasix 40 mg IV in ED and is diuresing well   - Continue diuresis with Lasix 40 mg IV q12h, SLIV, follow daily wt and I/O's, update echo, continue beta-blocker, hold ARB given increased creatinine    2. CKD stage III-IV  - SCr is 1.97 on admission, up from 1.56 in January 2019  - She is hypervolemic on admission and undergoing diuresis  - Follow daily chem panel with diuresis   3. Type II DM  - A1c was 5.7% in September '18, serum glucose 220 on admission  - Currently diet-controlled  - Follow CBG's and use SSI with Novolog while in hospital   4. CAD - No anginal complaints  - Continue Plavix and beta-blocker, hold ARB initially while following renal function     DVT prophylaxis: sq heparin  Code Status: DNR  Family Communication: Daughter updated at bedside Consults called: None Admission status: Inpatient    Vianne Bulls, MD [facility] Pager [phone]  If 7PM-7AM, please contact night-coverage [email or web address] Password [email or web address]  08/01/2017, 11:26 PM

## 2017-08-02 ENCOUNTER — Telehealth: Payer: Self-pay | Admitting: Cardiology

## 2017-08-02 ENCOUNTER — Encounter (HOSPITAL_COMMUNITY): Payer: Self-pay | Admitting: Cardiology

## 2017-08-02 ENCOUNTER — Other Ambulatory Visit: Payer: Self-pay

## 2017-08-02 ENCOUNTER — Inpatient Hospital Stay (HOSPITAL_COMMUNITY): Payer: Medicare Other

## 2017-08-02 DIAGNOSIS — M545 Low back pain: Secondary | ICD-10-CM

## 2017-08-02 DIAGNOSIS — G8929 Other chronic pain: Secondary | ICD-10-CM

## 2017-08-02 DIAGNOSIS — I5030 Unspecified diastolic (congestive) heart failure: Secondary | ICD-10-CM

## 2017-08-02 LAB — COMPREHENSIVE METABOLIC PANEL
ALT: 19 IU/L (ref 0–32)
AST: 24 IU/L (ref 0–40)
Albumin/Globulin Ratio: 2 (ref 1.2–2.2)
Albumin: 4.7 g/dL (ref 3.5–4.8)
Alkaline Phosphatase: 105 IU/L (ref 39–117)
BUN/Creatinine Ratio: 14 (ref 12–28)
BUN: 25 mg/dL (ref 8–27)
Bilirubin Total: 0.2 mg/dL (ref 0.0–1.2)
CO2: 26 mmol/L (ref 20–29)
Calcium: 10.3 mg/dL (ref 8.7–10.3)
Chloride: 96 mmol/L (ref 96–106)
Creatinine, Ser: 1.82 mg/dL — ABNORMAL HIGH (ref 0.57–1.00)
GFR calc Af Amer: 30 mL/min/{1.73_m2} — ABNORMAL LOW (ref >59)
GFR calc non Af Amer: 26 mL/min/{1.73_m2} — ABNORMAL LOW (ref >59)
Globulin, Total: 2.4 g/dL (ref 1.5–4.5)
Glucose: 102 mg/dL — ABNORMAL HIGH (ref 65–99)
Potassium: 5 mmol/L (ref 3.5–5.2)
Sodium: 140 mmol/L (ref 134–144)
Total Protein: 7.1 g/dL (ref 6.0–8.5)

## 2017-08-02 LAB — GLUCOSE, CAPILLARY
Glucose-Capillary: 118 mg/dL — ABNORMAL HIGH (ref 65–99)
Glucose-Capillary: 118 mg/dL — ABNORMAL HIGH (ref 65–99)
Glucose-Capillary: 103 mg/dL — ABNORMAL HIGH (ref 65–99)

## 2017-08-02 LAB — BASIC METABOLIC PANEL
Anion gap: 10 (ref 5–15)
BUN: 26 mg/dL — ABNORMAL HIGH (ref 6–20)
CO2: 28 mmol/L (ref 22–32)
Calcium: 9.5 mg/dL (ref 8.9–10.3)
Chloride: 101 mmol/L (ref 101–111)
Creatinine, Ser: 1.8 mg/dL — ABNORMAL HIGH (ref 0.44–1.00)
GFR calc Af Amer: 30 mL/min — ABNORMAL LOW (ref >60)
GFR calc non Af Amer: 26 mL/min — ABNORMAL LOW (ref >60)
Glucose, Bld: 110 mg/dL — ABNORMAL HIGH (ref 65–99)
Potassium: 3.6 mmol/L (ref 3.5–5.1)
Sodium: 139 mmol/L (ref 135–145)

## 2017-08-02 LAB — ECHOCARDIOGRAM COMPLETE
Height: 66 in
Weight: 2907.2 oz

## 2017-08-02 LAB — CBG MONITORING, ED
Glucose-Capillary: 124 mg/dL — ABNORMAL HIGH (ref 65–99)
Glucose-Capillary: 172 mg/dL — ABNORMAL HIGH (ref 65–99)

## 2017-08-02 MED ORDER — DIAZEPAM 5 MG PO TABS
5.0000 mg | ORAL_TABLET | Freq: Every day | ORAL | Status: DC | PRN
  Administered 2017-08-02: 5 mg via ORAL
  Filled 2017-08-02: qty 1

## 2017-08-02 MED ORDER — SODIUM CHLORIDE 0.9 % IV SOLN
INTRAVENOUS | Status: DC
  Administered 2017-08-03: 06:00:00 via INTRAVENOUS

## 2017-08-02 MED ORDER — PRAMIPEXOLE DIHYDROCHLORIDE 0.25 MG PO TABS
0.5000 mg | ORAL_TABLET | Freq: Every day | ORAL | Status: DC
  Administered 2017-08-02 (×2): 0.5 mg via ORAL
  Filled 2017-08-02 (×2): qty 2

## 2017-08-02 NOTE — Progress Notes (Addendum)
PROGRESS NOTE    [name]  LDJ:[id number] DOB: Oct 07, 1940 DOA: 08/01/2017 PCP: Redmond School, MD   Brief Narrative: [name] is a 77 y.o. Female with a history of CAD status post PCI, type 2 diabetes, chronic diastolic heart failure, breast cancer.  Patient presented with worsening lower extremity edema and development of dyspnea on exertion.   Assessment & Plan:   Principal Problem:   Acute on chronic diastolic CHF (congestive heart failure) (HCC) Active Problems:   CAD S/P percutaneous coronary angioplasty   Essential hypertension   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 (HCC)   Acute on chronic diastolic heart failure Patient with symptoms of lower extremity edema in addition to dyspnea on exertion.  She is on room air.  Echocardiogram obtained and was significant for increased echogenicity which was concerning for possible mass.  Echo otherwise stable.  -Continue IV Lasix 40 mg twice daily -Daily weights and strict in and outs  Acute kidney injury on CKD stage III Baseline creatinine of 1.5.  Up to 1.8 on admission with a peak of 1.9, currently trending down with diuresis.  Abnormal echocardiogram Concerning for possible mass.  Discussed with patient. -Consulted cardiology for Transesophageal Echocardiogram -N.p.o. at midnight  Breast cancer Patient currently on treatment for right breast cancer, estrogen receptor positive.  Patient sees [name] as an outpatient.  Currently on exemestane.  CAD status post PCI -Continue Plavix  Back pain Likely muscular.  No point tenderness.  No radiation or weakness. -Lumbar x-Niehoff -Adjusted diazepam to include for muscle spasms as needed  ?Diabetes mellitus, type 2 Highest hemoglobin A1C of 6.2%. Not on treatment. Appears that, at most, she is pre-diabetic. Last hemoglobin A1C of 5.7%. -Continue SSI   DVT prophylaxis: Heparin subcutaneous Code Status:   Code Status: DNR Family Communication: None at  bedside Disposition Plan: Discharge home likely in 24 hours after TEE and improvement of fluid status   Consultants:   Cardiology  Procedures:   Transthoracic Echocardiogram (5/7) Study Conclusions  - Left ventricle: The cavity size was normal. Wall thickness was   increased in a pattern of mild LVH. Systolic function was   vigorous. The estimated ejection fraction was in the range of 65%   to 70%. Wall motion was normal; there were no regional wall   motion abnormalities. Doppler parameters are consistent with   abnormal left ventricular relaxation (grade 1 diastolic   dysfunction).  Impressions:  - Vigorous LV systolic function; mild LVH; mild diastolic   dysfunction; echodensity in right atrium likely atrial wall;   suggest TEE to R/O mass.  Antimicrobials:  None    Subjective: No dyspnea. No chest pain.  Objective: Vitals:   08/02/17 0700 08/02/17 0826 08/02/17 1202 08/02/17 1402  BP: 120/76 (!) 149/82 105/63   Pulse: 87 92 88 94  Resp: (!) 25 20 20    Temp:      TempSrc:      SpO2: 92% 98% 96% 97%  Weight:  82.4 kg (181 lb 11.2 oz)    Height:  5\' 6"  (1.676 m)      Intake/Output Summary (Last 24 hours) at 08/02/2017 1632 Last data filed at 08/02/2017 1428 Gross per 24 hour  Intake 483 ml  Output 3900 ml  Net -3417 ml   Filed Weights   08/02/17 0826  Weight: 82.4 kg (181 lb 11.2 oz)    Examination:  General exam: Appears calm and comfortable Respiratory system: Clear to auscultation. Respiratory effort normal. Cardiovascular system:  S1 & S2 heard, RRR. No murmurs, rubs, gallops or clicks. Gastrointestinal system: Abdomen is nondistended, soft and nontender. No organomegaly or masses felt. Normal bowel sounds heard. Central nervous system: Alert and oriented. No focal neurological deficits. Musculoskeletal: Significant bilateral LE edema. No calf tenderness. No midline back tenderness Skin: No cyanosis. No rashes Psychiatry: Judgement and insight  appear normal. Mood & affect appropriate.     Data Reviewed: I have personally reviewed following labs and imaging studies  CBC: Recent Labs  Lab 08/01/17 1329  WBC 8.5  HGB 12.1  HCT 37.6  MCV 93.8  PLT 532   Basic Metabolic Panel: Recent Labs  Lab 08/01/17 1143 08/01/17 1329 08/02/17 0513  NA 140 136 139  K 5.0 4.1 3.6  CL 96 97* 101  CO2 26 27 28   GLUCOSE 102* 220* 110*  BUN 25 25* 26*  CREATININE 1.82* 1.94* 1.80*  CALCIUM 10.3 10.0 9.5   GFR: Estimated Creatinine Clearance: 27.9 mL/min (A) (by C-G formula based on SCr of 1.8 mg/dL (H)). Liver Function Tests: Recent Labs  Lab 08/01/17 1143  AST 24  ALT 19  ALKPHOS 105  BILITOT 0.2  PROT 7.1  ALBUMIN 4.7   No results for input(s): LIPASE, AMYLASE in the last 168 hours. No results for input(s): AMMONIA in the last 168 hours. Coagulation Profile: No results for input(s): INR, PROTIME in the last 168 hours. Cardiac Enzymes: No results for input(s): CKTOTAL, CKMB, CKMBINDEX, TROPONINI in the last 168 hours. BNP (last 3 results) No results for input(s): PROBNP in the last 8760 hours. HbA1C: No results for input(s): HGBA1C in the last 72 hours. CBG: Recent Labs  Lab 08/02/17 0004 08/02/17 0713 08/02/17 1132  GLUCAP 172* 124* 118*   Lipid Profile: No results for input(s): CHOL, HDL, LDLCALC, TRIG, CHOLHDL, LDLDIRECT in the last 72 hours. Thyroid Function Tests: No results for input(s): TSH, T4TOTAL, FREET4, T3FREE, THYROIDAB in the last 72 hours. Anemia Panel: No results for input(s): VITAMINB12, FOLATE, FERRITIN, TIBC, IRON, RETICCTPCT in the last 72 hours. Sepsis Labs: No results for input(s): PROCALCITON, LATICACIDVEN in the last 168 hours.  No results found for this or any previous visit (from the past 240 hour(s)).       Radiology Studies: Dg Chest 2 View  Result Date: 08/01/2017 CLINICAL DATA:  Fatigue and leg swelling for 1 day. Stage III renal failure. EXAM: CHEST - 2 VIEW COMPARISON:   06/09/2017 FINDINGS: Cardiomediastinal silhouette is normal. Mediastinal contours appear intact. Tortuosity and calcific atherosclerotic disease of the aorta. There is no evidence of focal airspace consolidation, pleural effusion or pneumothorax. Osseous structures are without acute abnormality. Soft tissues are grossly normal. IMPRESSION: No active cardiopulmonary disease. Electronically Signed   By: Fidela Salisbury M.D.   On: 08/01/2017 14:11   Dg Lumbar Spine 2-3 Views  Result Date: 08/02/2017 CLINICAL DATA:  Bilateral flank pain since mastectomies. EXAM: LUMBAR SPINE - 2-3 VIEW COMPARISON:  Coronal and sagittal reconstructed images through the lumbar spine from an abdominal and pelvic CT scan of November 16, 2015 FINDINGS: The lumbar vertebral bodies are preserved in height. The disc space heights are reasonably well-maintained with exception of L5-S1 where there is moderate narrowing with a vacuum phenomenon. The pedicles and transverse processes are intact. There is minimal anterolisthesis of L4 with respect L5 amounting to approximately 4 mm. There is calcification in the wall of the aortic arch. 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 No compression fracture. Minimal anterolisthesis of L4 with respect to  L5. The observed soft tissues of the abdomen are unremarkable. Electronically Signed   By: David  Martinique M.D.   On: 08/02/2017 15:16        Scheduled Meds: . allopurinol  100 mg Oral Daily  . busPIRone  7.5 mg Oral QHS  . cholecalciferol  2,000 Units Oral Daily  . clopidogrel  75 mg Oral Daily  . exemestane  25 mg Oral QPC breakfast  . furosemide  40 mg Intravenous Q12H  . gabapentin  200 mg Oral QHS  . heparin  5,000 Units Subcutaneous Q8H  . insulin aspart  0-5 Units Subcutaneous QHS  . insulin aspart  0-9 Units Subcutaneous TID WC  . metoprolol succinate  25 mg Oral Daily  . pantoprazole  40 mg Oral Daily  . pramipexole  0.25 mg Oral Q24H  . pramipexole  0.5 mg  Oral QHS  . rosuvastatin  20 mg Oral q1800  . sodium chloride flush  3 mL Intravenous Q12H   Continuous Infusions: . sodium chloride       LOS: 1 day     Cordelia Poche, MD [facility] 08/02/2017, 4:32 PM Pager: [phone]  If 7PM-7AM, please contact night-coverage [email or web address] 08/02/2017, 4:32 PM

## 2017-08-02 NOTE — Progress Notes (Signed)
    [facility] has been requested to perform a transesophageal echocardiogram on [name] for stroke.  After careful review of history and examination, the risks and benefits of transesophageal echocardiogram have been explained including risks of esophageal damage, perforation (1:10,000 risk), bleeding, pharyngeal hematoma as well as other potential complications associated with conscious sedation including aspiration, arrhythmia, respiratory failure and death. Alternatives to treatment were discussed, questions were answered. Patient is willing to proceed.   She is scheduled for TEE tomorrow 08/03/17 at 9:00AM with Dr. Oval Linsey. NPO at [place] please.  [place], [place]  08/02/2017 6:05 PM

## 2017-08-02 NOTE — Evaluation (Signed)
Physical Therapy Evaluation/Discharge Patient Details Name: [name] MRN: [id number] DOB: [birth year] Today's Date: 08/02/2017   History of Present Illness  77 yo admitted with weight gain, SOB, LE edema- acute on chronic CHF. PMHx: CAD, DM, and chronic diastolic CHF, breast CA, CKD, dementia, HTN, neuropathy  Clinical Impression  Pt pleasant and cooperative with PT and expressed desire to go home. Pt demonstrated ability to safely ambulate and ascend/descend stairs Mod I. PT recommending home D/C with OPPT follow up for balance deficits noted with standing balance with eyes closed and with tandem standing. Pt also educated regarding self care and avoiding activities such as getting on ladder to clean gutters in order to reduce fall risk. Dietary education also provided regarding limiting fluid and sodium intake. Pt verbalized understanding of all education and is agreeable. Pt has no other acute needs.    Follow Up Recommendations Outpatient PT;Supervision for mobility/OOB    Equipment Recommendations  None recommended by PT    Recommendations for Other Services       Precautions / Restrictions        Mobility  Bed Mobility Overal bed mobility: Modified Independent                Transfers Overall transfer level: Modified independent                  Ambulation/Gait Ambulation/Gait assistance: Modified independent (Device/Increase time) Ambulation Distance (Feet): 400 Feet Assistive device: None Gait Pattern/deviations: Step-through pattern;Decreased stride length   Gait velocity interpretation: 1.31 - 2.62 ft/sec, indicative of limited community ambulator General Gait Details: pt with forward trunk with gait but stable gait with long hall ambulation  Stairs Stairs: Yes Stairs assistance: Supervision Stair Management: Alternating pattern;Two rails Number of Stairs: 4 General stair comments: pt able to perform stairs with rail   Wheelchair Mobility     Modified Rankin (Stroke Patients Only)       Balance Overall balance assessment: Needs assistance   Sitting balance-Leahy Scale: Good       Standing balance-Leahy Scale: Good       Tandem Stance - Right Leg: 0 Tandem Stance - Left Leg: 0   Rhomberg - Eyes Closed: 4 High level balance activites: Head turns;Sudden stops;Turns;Other (comment)(high and low speed ambulation) High Level Balance Comments: Pt demonstrated ability to ambulate without LOB with head turns, changes of direction, ambulating at high and low speeds, and with sudden stops. Pt demonstrated mild decrease in gait speed with activities requiring head movement and when talking.              Pertinent Vitals/Pain Pain Assessment: 0-10 Pain Score: 3  Pain Location: bil feet Pain Descriptors / Indicators: Aching;Sore Pain Intervention(s): Limited activity within patient's tolerance    Home Living Family/patient expects to be discharged to:: Private residence Living Arrangements: Spouse/significant other Available Help at Discharge: Family;Available 24 hours/day Type of Home: House Home Access: Stairs to enter Entrance Stairs-Rails: Psychiatric nurse of Steps: 4 Home Layout: One level Home Equipment: None      Prior Function Level of Independence: Independent         Comments: drives when she feels well     Hand Dominance        Extremity/Trunk Assessment   Upper Extremity Assessment Upper Extremity Assessment: Overall WFL for tasks assessed    Lower Extremity Assessment Lower Extremity Assessment: Overall WFL for tasks assessed(bil LE edema)    Cervical / Trunk Assessment Cervical / Trunk Assessment: Normal  Communication   Communication: No difficulties  Cognition Arousal/Alertness: Awake/alert Behavior During Therapy: WFL for tasks assessed/performed Overall Cognitive Status: Within Functional Limits for tasks assessed                                         General Comments      Exercises     Assessment/Plan    PT Assessment All further PT needs can be met in the next venue of care  PT Problem List Decreased balance;Decreased activity tolerance;Decreased knowledge of use of DME       PT Treatment Interventions      PT Goals (Current goals can be found in the Care Plan section)  Acute Rehab PT Goals Patient Stated Goal: return home PT Goal Formulation: All assessment and education complete, DC therapy    Frequency     Barriers to discharge        Co-evaluation               AM-PAC PT "6 Clicks" Daily Activity  Outcome Measure Difficulty turning over in bed (including adjusting bedclothes, sheets and blankets)?: None Difficulty moving from lying on back to sitting on the side of the bed? : None Difficulty sitting down on and standing up from a chair with arms (e.g., wheelchair, bedside commode, etc,.)?: None Help needed moving to and from a bed to chair (including a wheelchair)?: None Help needed walking in hospital room?: A Little Help needed climbing 3-5 steps with a railing? : None 6 Click Score: 23    End of Session Equipment Utilized During Treatment: Gait belt Activity Tolerance: Patient tolerated treatment well Patient left: in chair;with call bell/phone within reach Nurse Communication: Mobility status PT Visit Diagnosis: Other abnormalities of gait and mobility (R26.89)    Time: 8916-9450 PT Time Calculation (min) (ACUTE ONLY): 27 min   Charges:   PT Evaluation $PT Eval Moderate Complexity: 1 Mod     PT G Codes:        Gabe Shebra Muldrow, SPT  [employer] 08/02/2017, 2:35 PM

## 2017-08-02 NOTE — Telephone Encounter (Signed)
Left message to call back -- on voice and answer machine -- patient is currently in ER TO BE ADMITTTED

## 2017-08-02 NOTE — Progress Notes (Signed)
  Echocardiogram 2D Echocardiogram has been performed.  Jannett Celestine 08/02/2017, 10:13 AM

## 2017-08-02 NOTE — Progress Notes (Signed)
Pt refused bed alarm, despite being educated on the importance of it.

## 2017-08-02 NOTE — Telephone Encounter (Signed)
New message  Pt husband verbalzied that he is calling for RN  To give an update on his wife

## 2017-08-03 ENCOUNTER — Inpatient Hospital Stay (HOSPITAL_COMMUNITY): Payer: Medicare Other

## 2017-08-03 ENCOUNTER — Encounter (HOSPITAL_COMMUNITY): Payer: Self-pay | Admitting: Cardiovascular Disease

## 2017-08-03 ENCOUNTER — Ambulatory Visit: Payer: Medicare Other | Admitting: Cardiology

## 2017-08-03 ENCOUNTER — Encounter (HOSPITAL_COMMUNITY)
Admission: EM | Disposition: A | Discharge status: Home / Self Care | Payer: Self-pay | Source: Home / Self Care | Attending: Family Medicine

## 2017-08-03 DIAGNOSIS — Z9861 Coronary angioplasty status: Secondary | ICD-10-CM

## 2017-08-03 DIAGNOSIS — D151 Benign neoplasm of heart: Secondary | ICD-10-CM

## 2017-08-03 DIAGNOSIS — I519 Heart disease, unspecified: Secondary | ICD-10-CM

## 2017-08-03 DIAGNOSIS — E1165 Type 2 diabetes mellitus with hyperglycemia: Secondary | ICD-10-CM

## 2017-08-03 DIAGNOSIS — I5189 Other ill-defined heart diseases: Secondary | ICD-10-CM

## 2017-08-03 DIAGNOSIS — E118 Type 2 diabetes mellitus with unspecified complications: Secondary | ICD-10-CM

## 2017-08-03 DIAGNOSIS — I10 Essential (primary) hypertension: Secondary | ICD-10-CM

## 2017-08-03 DIAGNOSIS — I5033 Acute on chronic diastolic (congestive) heart failure: Secondary | ICD-10-CM

## 2017-08-03 DIAGNOSIS — I2510 Atherosclerotic heart disease of native coronary artery without angina pectoris: Secondary | ICD-10-CM

## 2017-08-03 HISTORY — PX: TEE WITHOUT CARDIOVERSION: SHX5443

## 2017-08-03 LAB — PROTIME-INR
INR: 1.04
Prothrombin Time: 13.5 seconds (ref 11.4–15.2)

## 2017-08-03 LAB — BASIC METABOLIC PANEL
Anion gap: 11 (ref 5–15)
BUN: 27 mg/dL — ABNORMAL HIGH (ref 6–20)
CO2: 28 mmol/L (ref 22–32)
Calcium: 9.9 mg/dL (ref 8.9–10.3)
Chloride: 101 mmol/L (ref 101–111)
Creatinine, Ser: 1.75 mg/dL — ABNORMAL HIGH (ref 0.44–1.00)
GFR calc Af Amer: 31 mL/min — ABNORMAL LOW (ref >60)
GFR calc non Af Amer: 27 mL/min — ABNORMAL LOW (ref >60)
Glucose, Bld: 119 mg/dL — ABNORMAL HIGH (ref 65–99)
Potassium: 4 mmol/L (ref 3.5–5.1)
Sodium: 140 mmol/L (ref 135–145)

## 2017-08-03 LAB — GLUCOSE, CAPILLARY
Glucose-Capillary: 118 mg/dL — ABNORMAL HIGH (ref 65–99)
Glucose-Capillary: 149 mg/dL — ABNORMAL HIGH (ref 65–99)

## 2017-08-03 SURGERY — ECHOCARDIOGRAM, TRANSESOPHAGEAL
Anesthesia: Moderate Sedation

## 2017-08-03 MED ORDER — MIDAZOLAM HCL 10 MG/2ML IJ SOLN
INTRAMUSCULAR | Status: DC | PRN
  Administered 2017-08-03: 1 mg via INTRAVENOUS
  Administered 2017-08-03 (×2): 2 mg via INTRAVENOUS
  Administered 2017-08-03: 1 mg via INTRAVENOUS

## 2017-08-03 MED ORDER — BUTAMBEN-TETRACAINE-BENZOCAINE 2-2-14 % EX AERO
INHALATION_SPRAY | CUTANEOUS | Status: DC | PRN
  Administered 2017-08-03: 2 via TOPICAL

## 2017-08-03 MED ORDER — FENTANYL CITRATE (PF) 100 MCG/2ML IJ SOLN
INTRAMUSCULAR | Status: AC
  Filled 2017-08-03: qty 2

## 2017-08-03 MED ORDER — DIPHENHYDRAMINE HCL 50 MG/ML IJ SOLN
INTRAMUSCULAR | Status: DC | PRN
  Administered 2017-08-03: 25 mg via INTRAVENOUS

## 2017-08-03 MED ORDER — FENTANYL CITRATE (PF) 100 MCG/2ML IJ SOLN
INTRAMUSCULAR | Status: DC | PRN
  Administered 2017-08-03 (×2): 25 ug via INTRAVENOUS
  Administered 2017-08-03 (×2): 12.5 ug via INTRAVENOUS

## 2017-08-03 MED ORDER — MIDAZOLAM HCL 5 MG/ML IJ SOLN
INTRAMUSCULAR | Status: AC
  Filled 2017-08-03: qty 2

## 2017-08-03 NOTE — Progress Notes (Signed)
Patient ambulated in hallway independently (RN standby). Foley removed per orders.

## 2017-08-03 NOTE — Care Management Note (Signed)
Case Management Note  Patient Details  Name: [name] MRN: [id number] Date of Birth: 12-Jul-1940  Subjective/Objective:   CHF                Action/Plan: Patient lives at home with family; PCP: Redmond School, MD; has private insurance with Medicare; No DME, she states that she is very active at home and is refusing Outpatient PT; patient states " I don't need it." CM will continue to follow for progression of care.  Expected Discharge Date:  08/05/17               Expected Discharge Plan:  Home/Self Care  In-House Referral:   [facility]  Discharge planning Services  CM Consult  Status of Service:  In process, will continue to follow  Sherrilyn Rist [phone] 08/03/2017, 11:14 AM

## 2017-08-03 NOTE — Discharge Summary (Signed)
Physician Discharge Summary  [name]:[id number] DOB: 05-Oct-1940 DOA: 08/01/2017  PCP: Redmond School, MD  Admit date: 08/01/2017 Discharge date: 08/03/2017  Admitted From: Home  Disposition:  Home   Recommendations for Outpatient Follow-up:  1. Follow up with PCP in 1-2 weeks 2. Please obtain BMP/CBC in one week  Home Health: None  Equipment/Devices: None  Discharge Condition: Good  CODE STATUS: DO NOT RESUSCITATE Diet recommendation: Cardiac  Brief/Interim Summary: [name] is a 77 y.o. Female with a history of CAD status post PCI, type 2 diabetes, chronic diastolic heart failure, breast cancer.  Patient presented with worsening lower extremity edema and development of dyspnea on exertion, found to have CHF failed outpatient oral Bumex and Lasix, started on IV Lasix.     Acute on chronic diastolic CHF Start on IV Lasix, diuresed 5 L, leg swelling resolved, dyspnea on exertion resolved.  Echocardiogram showed EF 65%.  Discharge weight 180 lbs.  Abnormal echo There is question of an increased echogenicity concerning for mass.  TEE was performed in follow-up and showed no mass.    Chronic kidney disease stage IV Baseline Cr 1.5-1.8, follows with [name].  Stable with diuresis.  History of breast cancer Exemestane continued    Discharge Diagnoses:  Principal Problem:   Acute on chronic diastolic CHF (congestive heart failure) (HCC) Active Problems:   CAD S/P percutaneous coronary angioplasty   Essential hypertension   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 (HCC)   Right atrial mass    Discharge Instructions  Discharge Instructions    (HEART FAILURE PATIENTS) Call MD:  Anytime you have any of the following symptoms: 1) 3 pound weight gain in 24 hours or 5 pounds in 1 week 2) shortness of breath, with or without a dry hacking cough 3) swelling in the hands, feet or stomach 4) if you have to sleep on extra pillows at night in order to breathe.    Complete by:  As directed    Diet - low sodium heart healthy   Complete by:  As directed    Discharge instructions   Complete by:  As directed    From Dr. Loleta Books: You were admitted for leg swelling that was from congestive heart failure. This was treated with high dose IV Lasix, and the fluid was removed.   You should resume your home Bumex once daily Limit your sodium intake to less than 2000 mg daily. Limit your fluid intake to less than 2L daily (meaning, do not drink more than 1/2 gallon of water, soda, juice, crystal light, etc, in a day)  WEIGH YOURSELF EVERY DAY If your weight increases by more than 5 lbs from your "dry weight" (the weight when you leave the hospital), call your primary care doctor immediately  Resume your home medicines. If you are unsure about losartan, call Dr. Serita Grit office to confirm if you are supposed to take Losartan before restarting.   Heart Failure patients record your daily weight using the same scale at the same time of day   Complete by:  As directed    Increase activity slowly   Complete by:  As directed    STOP any activity that causes chest pain, shortness of breath, dizziness, sweating, or exessive weakness   Complete by:  As directed      Allergies as of 08/03/2017      Reactions   Lipitor [atorvastatin] Other (See Comments)   Bone and muscle pain   Lyrica [pregabalin]  Weight gain, extremity swelling   Nsaids Swelling   Reglan [metoclopramide]    Tardive dyskinesia; paradoxical reaction not relieved by benadryl   Ultram [tramadol] Other (See Comments)   Pt has seizure activity with this medication      Medication List    STOP taking these medications   colchicine 0.6 MG tablet   nitroGLYCERIN 0.4 MG/SPRAY spray Commonly known as:  NITROLINGUAL     TAKE these medications   allopurinol 100 MG tablet Commonly known as:  ZYLOPRIM Take 1 tablet (100 mg total) by mouth daily.   bumetanide 2 MG tablet Commonly known as:   BUMEX Take 2 mg by mouth daily.   busPIRone 7.5 MG tablet Commonly known as:  BUSPAR Take 7.5 mg by mouth at bedtime.   CALCIUM 600 + D PO Take 1 tablet by mouth at bedtime.   CEREFOLIN NAC 6-2-600 MG Tabs TAKE 1 CAPLET BY MOUTH ONCE DAILY. Notes to patient:  08/04/17   Cholecalciferol 2000 units Caps Take 2,000 Units by mouth daily.   clopidogrel 75 MG tablet Commonly known as:  PLAVIX Take 1 tablet (75 mg total) by mouth daily. Due for f/u appt, please call office   COD LIVER OIL PO Take 415 mg by mouth daily.   CRANBERRY PO Take 1 capsule by mouth daily.   diazepam 5 MG tablet Commonly known as:  VALIUM Take 5 mg by mouth daily as needed for anxiety.   diphenhydrAMINE 50 MG capsule Commonly known as:  BENADRYL Take 50 mg by mouth at bedtime as needed for sleep.   exemestane 25 MG tablet Commonly known as:  AROMASIN Take 1 tablet (25 mg total) daily after breakfast by mouth.   FUSION PLUS Caps takes 1 tablet daily   gabapentin 300 MG capsule Commonly known as:  NEURONTIN Take 600 mg by mouth at bedtime.   HAIR SKIN & NAILS GUMMIES PO Take 2 tablets by mouth daily.   KRILL OIL PLUS PO Take 1 capsule by mouth daily.   losartan 100 MG tablet Commonly known as:  COZAAR take 1 tablet daily   Melatonin 5 MG Caps Take 5 mg by mouth at bedtime as needed (sleep).   metoprolol succinate 50 MG 24 hr tablet Commonly known as:  TOPROL XL TAKE ONE-HALF (1/2) TABLET DAILY   pantoprazole 40 MG tablet Commonly known as:  PROTONIX TAKE 1 TABLET DAILY (CONTACT OFFICE FOR ADDITIONAL REFILLS, FINAL ATTEMPT)   pramipexole 0.25 MG tablet Commonly known as:  MIRAPEX TAKE 1 TABLET AT 4:00 P.M. AND 2 TABLETS (0.5 MG) AT BEDTIME   RESERVAPAK PO Take 1 tablet by mouth every evening.   rosuvastatin 20 MG tablet Commonly known as:  CRESTOR Take 20 mg by mouth at bedtime.       Allergies  Allergen Reactions  . Lipitor [Atorvastatin] Other (See Comments)    Bone  and muscle pain  . Lyrica [Pregabalin]     Weight gain, extremity swelling  . Nsaids Swelling  . Reglan [Metoclopramide]     Tardive dyskinesia; paradoxical reaction not relieved by benadryl  . Ultram [Tramadol] Other (See Comments)    Pt has seizure activity with this medication    Consultations:  Cardiology   Procedures/Studies: Dg Chest 2 View  Result Date: 08/01/2017 CLINICAL DATA:  Fatigue and leg swelling for 1 day. Stage III renal failure. EXAM: CHEST - 2 VIEW COMPARISON:  06/09/2017 FINDINGS: Cardiomediastinal silhouette is normal. Mediastinal contours appear intact. Tortuosity and calcific atherosclerotic disease of the  aorta. There is no evidence of focal airspace consolidation, pleural effusion or pneumothorax. Osseous structures are without acute abnormality. Soft tissues are grossly normal. IMPRESSION: No active cardiopulmonary disease. Electronically Signed   By: Fidela Salisbury M.D.   On: 08/01/2017 14:11   Dg Lumbar Spine 2-3 Views  Result Date: 08/02/2017 CLINICAL DATA:  Bilateral flank pain since mastectomies. EXAM: LUMBAR SPINE - 2-3 VIEW COMPARISON:  Coronal and sagittal reconstructed images through the lumbar spine from an abdominal and pelvic CT scan of November 16, 2015 FINDINGS: The lumbar vertebral bodies are preserved in height. The disc space heights are reasonably well-maintained with exception of L5-S1 where there is moderate narrowing with a vacuum phenomenon. The pedicles and transverse processes are intact. There is minimal anterolisthesis of L4 with respect L5 amounting to approximately 4 mm. There is calcification in the wall of the aortic arch. 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 No compression fracture. Minimal anterolisthesis of L4 with respect to L5. The observed soft tissues of the abdomen are unremarkable. Electronically Signed   By: David  Martinique M.D.   On: 08/02/2017 15:16   Echocardiogram 5/7 Study Conclusions  - Left  ventricle: The cavity size was normal. Wall thickness was   increased in a pattern of mild LVH. Systolic function was   vigorous. The estimated ejection fraction was in the range of 65%   to 70%. Wall motion was normal; there were no regional wall   motion abnormalities. Doppler parameters are consistent with   abnormal left ventricular relaxation (grade 1 diastolic   dysfunction).  Impressions:  - Vigorous LV systolic function; mild LVH; mild diastolic   dysfunction; echodensity in right atrium likely atrial wall;   suggest TEE to R/O mass.   TEE 5/8 Study Conclusions  - Left ventricle: Systolic function was normal. The estimated   ejection fraction was in the range of 60% to 65%. Wall motion was   normal; there were no regional wall motion abnormalities. - Left atrium: No evidence of thrombus in the atrial cavity or   appendage. No evidence of thrombus in the atrial cavity or   appendage. - Right atrium: No evidence of thrombus in the atrial cavity or   appendage. - Atrial septum: There was increased thickness of the septum,   consistent with lipomatous hypertrophy. No defect or patent   foramen ovale was identified.    Subjective: Feels well.  Ambulated with nursing without dyspnea or chest discomfort.  Leg swelling is minimal.  No orthopnea or PND.  No cough, sputum.  Discharge Exam: Vitals:   08/03/17 1051 08/03/17 1245  BP: 116/72 116/72  Pulse: 75 75  Resp: 18 18  Temp: 98.1 F (36.7 C) 98.1 F (36.7 C)  SpO2: 98% 98%   Vitals:   08/03/17 1011 08/03/17 1021 08/03/17 1051 08/03/17 1245  BP: (!) 133/58 112/86 116/72 116/72  Pulse: 79 78 75 75  Resp: 20 14 18 18   Temp: 98.6 F (37 C)  98.1 F (36.7 C) 98.1 F (36.7 C)  TempSrc: Oral  Oral Oral  SpO2: 99% 99% 98% 98%  Weight:      Height:        General: Pt is alert, awake, not in acute distress, sitting in chair, conversational Cardiovascular: RRR, S1/S2 +, no rubs, no gallops Respiratory: CTA  bilaterally, no wheezing, no rhonchi Abdominal: Soft, NT, ND, bowel sounds + Extremities: no pitting edema at all, no cyanosis    The results of significant diagnostics from this  hospitalization (including imaging, microbiology, ancillary and laboratory) are listed below for reference.     Microbiology: No results found for this or any previous visit (from the past 240 hour(s)).   Labs: BNP (last 3 results) Recent Labs    08/01/17 1329  BNP 06.2   Basic Metabolic Panel: Recent Labs  Lab 08/01/17 1143 08/01/17 1329 08/02/17 0513 08/03/17 0540  NA 140 136 139 140  K 5.0 4.1 3.6 4.0  CL 96 97* 101 101  CO2 26 27 28 28   GLUCOSE 102* 220* 110* 119*  BUN 25 25* 26* 27*  CREATININE 1.82* 1.94* 1.80* 1.75*  CALCIUM 10.3 10.0 9.5 9.9   Liver Function Tests: Recent Labs  Lab 08/01/17 1143  AST 24  ALT 19  ALKPHOS 105  BILITOT 0.2  PROT 7.1  ALBUMIN 4.7   No results for input(s): LIPASE, AMYLASE in the last 168 hours. No results for input(s): AMMONIA in the last 168 hours. CBC: Recent Labs  Lab 08/01/17 1329  WBC 8.5  HGB 12.1  HCT 37.6  MCV 93.8  PLT 274   Cardiac Enzymes: No results for input(s): CKTOTAL, CKMB, CKMBINDEX, TROPONINI in the last 168 hours. BNP: Invalid input(s): POCBNP CBG: Recent Labs  Lab 08/02/17 1132 08/02/17 1649 08/02/17 2240 08/03/17 0738 08/03/17 1213  GLUCAP 118* 118* 103* 118* 149*   D-Dimer No results for input(s): DDIMER in the last 72 hours. Hgb A1c No results for input(s): HGBA1C in the last 72 hours. Lipid Profile No results for input(s): CHOL, HDL, LDLCALC, TRIG, CHOLHDL, LDLDIRECT in the last 72 hours. Thyroid function studies No results for input(s): TSH, T4TOTAL, T3FREE, THYROIDAB in the last 72 hours.  Invalid input(s): FREET3 Anemia work up No results for input(s): VITAMINB12, FOLATE, FERRITIN, TIBC, IRON, RETICCTPCT in the last 72 hours. Urinalysis    Component Value Date/Time   COLORURINE YELLOW  11/17/2015 0209   APPEARANCEUR CLEAR 11/17/2015 0209   LABSPEC 1.012 11/17/2015 0209   PHURINE 5.5 11/17/2015 0209   GLUCOSEU NEGATIVE 11/17/2015 0209   HGBUR NEGATIVE 11/17/2015 0209   BILIRUBINUR NEGATIVE 11/17/2015 0209   KETONESUR NEGATIVE 11/17/2015 0209   PROTEINUR NEGATIVE 11/17/2015 0209   UROBILINOGEN 0.2 10/05/2013 0059   NITRITE NEGATIVE 11/17/2015 0209   LEUKOCYTESUR NEGATIVE 11/17/2015 0209   Sepsis Labs Invalid input(s): PROCALCITONIN,  WBC,  LACTICIDVEN Microbiology No results found for this or any previous visit (from the past 240 hour(s)).   Time coordinating discharge: 35 minutes  SIGNED:   Edwin Dada, MD  [facility] 08/03/2017, 5:24 PM

## 2017-08-03 NOTE — Progress Notes (Signed)
Patient returned from TEE.  Alert and oriented. VSS.  Patient has call bell and phone in reach. Family at bedside. Lunch ordered.

## 2017-08-03 NOTE — Progress Notes (Signed)
Patient and family given discharge instructions and all questions answered.   

## 2017-08-03 NOTE — H&P (Signed)
[name] is a 77 y.o. female who has presented today for surgery, with the diagnosis of right atrial mass. The various methods of treatment have been discussed with the patient and family. After consideration of risks, benefits and other options for treatment, the patient has consented to Procedure(s): TRANSESOPHAGEAL ECHOCARDIOGRAM (TEE) (N/A) as a surgical intervention . The patient's history has been reviewed, patient examined, no change in status, stable for surgery. I have reviewed the patient's chart and labs. Questions were answered to the patient's satisfaction.   Jonothan Heberle C. Oval Linsey, MD, [facility]  08/03/2017 9:28 AM

## 2017-08-03 NOTE — CV Procedure (Signed)
Brief TEE Note  LVEF 60-65% No LA/LAA thrombus or mass Mild TR and PR.  Trivial MR. No RA masses. There is lipomatous hypertrophy of the interatrial septum. No ASD/PFO. There is a prominent ridge of tissue extending from the Coumadin [facility] that accounts for the findings seen on TTE.  No evidence of mass or thrombus.  For additional details see full report.  During this procedure the patient is administered a total of Versed 6 mg, Fentanyl 75 mcg, and Benadryl 25mg   to achieve and maintain moderate conscious sedation.  The patient's heart rate, blood pressure, and oxygen saturation are monitored continuously during the procedure. The period of conscious sedation is 33 minutes, of which I was present face-to-face 100% of this time.  Royale Swamy C. Oval Linsey, MD, [facility] 08/03/2017 10:05 AM

## 2017-08-03 NOTE — Progress Notes (Signed)
  Echocardiogram Echocardiogram Transesophageal has been performed.  [name] M 08/03/2017, 10:29 AM

## 2017-08-04 NOTE — Telephone Encounter (Signed)
Spoke with pt, she is home from the hospital now. She wanted dr harding to look over the labs and information to see if anything else is needed. Explained to the patient she is due for a yearly check up and follow up was made for her in June. She would like dr harding to look over the hospital information to see if she needs to do anything prior to her follow up appointment. currently she has swelling in her feet and legs. She is following up with her medical doctor. Will forward to dr harding to review and advise.

## 2017-08-05 ENCOUNTER — Encounter (HOSPITAL_COMMUNITY): Payer: Self-pay | Admitting: Cardiovascular Disease

## 2017-08-05 DIAGNOSIS — N390 Urinary tract infection, site not specified: Secondary | ICD-10-CM | POA: Diagnosis not present

## 2017-08-05 DIAGNOSIS — E119 Type 2 diabetes mellitus without complications: Secondary | ICD-10-CM | POA: Diagnosis not present

## 2017-08-05 DIAGNOSIS — Z6829 Body mass index (BMI) 29.0-29.9, adult: Secondary | ICD-10-CM | POA: Diagnosis not present

## 2017-08-05 DIAGNOSIS — I10 Essential (primary) hypertension: Secondary | ICD-10-CM | POA: Diagnosis not present

## 2017-08-05 DIAGNOSIS — E663 Overweight: Secondary | ICD-10-CM | POA: Diagnosis not present

## 2017-08-05 DIAGNOSIS — R300 Dysuria: Secondary | ICD-10-CM | POA: Diagnosis not present

## 2017-08-08 ENCOUNTER — Other Ambulatory Visit: Payer: Self-pay

## 2017-08-08 DIAGNOSIS — E663 Overweight: Secondary | ICD-10-CM | POA: Diagnosis not present

## 2017-08-08 DIAGNOSIS — N390 Urinary tract infection, site not specified: Secondary | ICD-10-CM | POA: Diagnosis not present

## 2017-08-08 DIAGNOSIS — Z6829 Body mass index (BMI) 29.0-29.9, adult: Secondary | ICD-10-CM | POA: Diagnosis not present

## 2017-08-08 NOTE — Telephone Encounter (Signed)
Was discharged home -- will review.  She probably should have Cards f/u scheduled with me or APP.  Glenetta Hew, MD

## 2017-08-08 NOTE — Patient Outreach (Signed)
[facility]) Care Management  08/08/2017  UMI MAINOR [birth year] [id number]     EMMI-HF RED ON EMMI ALERT Day # 3 Date: 08/06/17 Red Alert Reason: "Weight? 200 lbs"   Outreach attempt # 1 to patient. Spoke with patient who voices she is doing fairly well but still is tired a lot. Reviewed and addressed red alert with patient. Patient is monitoring and recording weight daily. Patient able to review weight log with RN CM. Weight on 08/06/17 was 184 lbs, yesterday's wight was 183 lbs and her weight this morning was 185.5 lbs. Patient able to verbalize weight increase and the need to adjust meds. She reports that she saw PCP on last week and was advised to "double up" on diuretic if she gains 2-3lbs. Patient reports that she took an extra dose of med this morning. She states that she has issues with urinary retention at times and has to be catherized by her spouse prn. She reports that she has not "felt" the need to do so lately. She states she is adhering to diet and fluid restrictions. She denies any new and/or worsening edema and SOB. She is aware of s/s of worsening condition and when to seek medical attention. Patient has BP monitor in the home and BP and HR have been WNL. She states that she is currently being treated for a UTI as well. Patient voiced that she is in the process of finding a new PCP. She states that she wants to find one that is more local and closer to her home. She has been given a "booklet" of MDs and plans to review the MD names and make a decision soon. No issues with meds and transportation reported. She denies any RN CM needs or concerns at this time. Advised patient that they would continue to get automated EMMI- HF post discharge calls to assess how they are doing following recent hospitalization and will receive a call from a nurse if any of their responses were abnormal. Patient voiced understanding and was appreciative of f/u call.       Plan: RN  CM will close case as no further interventions needed at this time.   Enzo Montgomery, RN,BSN,CCM [facility] Telephonic Care Management Coordinator Direct Phone: [phone] Toll Free: [phone] Fax: [phone]

## 2017-08-09 ENCOUNTER — Other Ambulatory Visit: Payer: Self-pay

## 2017-08-09 NOTE — Patient Outreach (Signed)
[facility]) Care Management  08/09/2017  [name] 1940-07-19 [id number]     EMMI-HF RED ON EMMI ALERT Day # 5 Date: 08/08/17 Red Alert Reason: "Weight? 185 lbs"   Red on EMMI dashboard received. No outreach call warranted to patient at this time. RN CM addressed issue on previous call.       Plan: RN CM will close case as no further interventions needed at this time.    Enzo Montgomery, RN,BSN,CCM [facility] Telephonic Care Management Coordinator Direct Phone: [phone] Toll Free: [phone] Fax: [phone]

## 2017-08-10 ENCOUNTER — Ambulatory Visit: Payer: Medicare Other | Admitting: Hematology

## 2017-08-10 ENCOUNTER — Other Ambulatory Visit: Payer: Medicare Other

## 2017-08-15 ENCOUNTER — Other Ambulatory Visit: Payer: Self-pay

## 2017-08-15 ENCOUNTER — Inpatient Hospital Stay (HOSPITAL_BASED_OUTPATIENT_CLINIC_OR_DEPARTMENT_OTHER): Payer: Medicare Other | Admitting: Hematology

## 2017-08-15 ENCOUNTER — Inpatient Hospital Stay: Payer: Medicare Other | Attending: Hematology

## 2017-08-15 ENCOUNTER — Telehealth: Payer: Self-pay

## 2017-08-15 ENCOUNTER — Encounter: Payer: Self-pay | Admitting: Hematology

## 2017-08-15 VITALS — BP 137/85 | HR 77 | Temp 97.8°F | Resp 19 | Ht 66.0 in | Wt 184.7 lb

## 2017-08-15 DIAGNOSIS — G629 Polyneuropathy, unspecified: Secondary | ICD-10-CM | POA: Diagnosis not present

## 2017-08-15 DIAGNOSIS — Z170 Estrogen receptor positive status [ER+]: Secondary | ICD-10-CM | POA: Diagnosis not present

## 2017-08-15 DIAGNOSIS — Z85828 Personal history of other malignant neoplasm of skin: Secondary | ICD-10-CM | POA: Diagnosis not present

## 2017-08-15 DIAGNOSIS — I509 Heart failure, unspecified: Secondary | ICD-10-CM | POA: Insufficient documentation

## 2017-08-15 DIAGNOSIS — Z8673 Personal history of transient ischemic attack (TIA), and cerebral infarction without residual deficits: Secondary | ICD-10-CM

## 2017-08-15 DIAGNOSIS — C50412 Malignant neoplasm of upper-outer quadrant of left female breast: Secondary | ICD-10-CM | POA: Diagnosis not present

## 2017-08-15 DIAGNOSIS — Z90710 Acquired absence of both cervix and uterus: Secondary | ICD-10-CM

## 2017-08-15 DIAGNOSIS — Z79899 Other long term (current) drug therapy: Secondary | ICD-10-CM | POA: Diagnosis not present

## 2017-08-15 DIAGNOSIS — Z90722 Acquired absence of ovaries, bilateral: Secondary | ICD-10-CM | POA: Insufficient documentation

## 2017-08-15 DIAGNOSIS — I2510 Atherosclerotic heart disease of native coronary artery without angina pectoris: Secondary | ICD-10-CM

## 2017-08-15 DIAGNOSIS — M1990 Unspecified osteoarthritis, unspecified site: Secondary | ICD-10-CM | POA: Diagnosis not present

## 2017-08-15 DIAGNOSIS — M7989 Other specified soft tissue disorders: Secondary | ICD-10-CM

## 2017-08-15 DIAGNOSIS — E119 Type 2 diabetes mellitus without complications: Secondary | ICD-10-CM | POA: Diagnosis not present

## 2017-08-15 DIAGNOSIS — Z85820 Personal history of malignant melanoma of skin: Secondary | ICD-10-CM

## 2017-08-15 DIAGNOSIS — C50211 Malignant neoplasm of upper-inner quadrant of right female breast: Secondary | ICD-10-CM

## 2017-08-15 DIAGNOSIS — F419 Anxiety disorder, unspecified: Secondary | ICD-10-CM

## 2017-08-15 DIAGNOSIS — I10 Essential (primary) hypertension: Secondary | ICD-10-CM | POA: Diagnosis not present

## 2017-08-15 DIAGNOSIS — Z809 Family history of malignant neoplasm, unspecified: Secondary | ICD-10-CM | POA: Diagnosis not present

## 2017-08-15 DIAGNOSIS — C50811 Malignant neoplasm of overlapping sites of right female breast: Secondary | ICD-10-CM

## 2017-08-15 DIAGNOSIS — F0390 Unspecified dementia without behavioral disturbance: Secondary | ICD-10-CM | POA: Diagnosis not present

## 2017-08-15 DIAGNOSIS — E785 Hyperlipidemia, unspecified: Secondary | ICD-10-CM

## 2017-08-15 LAB — CMP (CANCER CENTER ONLY)
ALT: 19 U/L (ref 0–55)
AST: 22 U/L (ref 5–34)
Albumin: 3.8 g/dL (ref 3.5–5.0)
Alkaline Phosphatase: 92 U/L (ref 40–150)
Anion gap: 11 (ref 3–11)
BUN: 57 mg/dL — ABNORMAL HIGH (ref 7–26)
CO2: 26 mmol/L (ref 22–29)
Calcium: 10 mg/dL (ref 8.4–10.4)
Chloride: 105 mmol/L (ref 98–109)
Creatinine: 2.22 mg/dL — ABNORMAL HIGH (ref 0.60–1.10)
GFR, Est AFR Am: 23 mL/min — ABNORMAL LOW (ref >60)
GFR, Estimated: 20 mL/min — ABNORMAL LOW (ref >60)
Glucose, Bld: 92 mg/dL (ref 70–140)
Potassium: 4 mmol/L (ref 3.5–5.1)
Sodium: 142 mmol/L (ref 136–145)
Total Bilirubin: 0.3 mg/dL (ref 0.2–1.2)
Total Protein: 7.1 g/dL (ref 6.4–8.3)

## 2017-08-15 LAB — CBC WITH DIFFERENTIAL (CANCER CENTER ONLY)
Basophils Absolute: 0 10*3/uL (ref 0.0–0.1)
Basophils Relative: 1 %
Eosinophils Absolute: 0.2 10*3/uL (ref 0.0–0.5)
Eosinophils Relative: 3 %
HCT: 35.8 % (ref 34.8–46.6)
Hemoglobin: 11.9 g/dL (ref 11.6–15.9)
Lymphocytes Relative: 17 %
Lymphs Abs: 1.2 10*3/uL (ref 0.9–3.3)
MCH: 30.5 pg (ref 25.1–34.0)
MCHC: 33.1 g/dL (ref 31.5–36.0)
MCV: 92.1 fL (ref 79.5–101.0)
Monocytes Absolute: 0.5 10*3/uL (ref 0.1–0.9)
Monocytes Relative: 8 %
Neutro Abs: 5 10*3/uL (ref 1.5–6.5)
Neutrophils Relative %: 71 %
Platelet Count: 211 10*3/uL (ref 145–400)
RBC: 3.89 MIL/uL (ref 3.70–5.45)
RDW: 15 % — ABNORMAL HIGH (ref 11.2–14.5)
WBC Count: 6.9 10*3/uL (ref 3.9–10.3)

## 2017-08-15 NOTE — Patient Outreach (Signed)
[facility]) Care Management  08/15/2017  [name] 1940-08-16 [id number]     EMMI-HF RED ON EMMI ALERT Day # 9 Date: 08/12/17 Red Alert Reason: "New/worsening problems? Yes  New swelling? Yes"   Outreach attempt # 1 to patient. No answer at present. RN CM left HIPAA compliant voicemail message for patient.         Plan: RN CM will make outreach attempt to patient.    Enzo Montgomery, RN,BSN,CCM [facility] Telephonic Care Management Coordinator Direct Phone: [phone] Toll Free: [phone] Fax: [phone]

## 2017-08-15 NOTE — Telephone Encounter (Signed)
Printed avs and calender of upcoming appointment. Per 5/20 los Patient will contact [facility] when she return home. Scheduling que

## 2017-08-15 NOTE — Patient Outreach (Signed)
[facility]) Care Management  08/15/2017  [name] [birth year] [id number]   EMMI-HF RED ON EMMI ALERT Day # 9 Date: 08/12/17 Red Alert Reason: "New/worsening problems? Yes  New swelling? Yes"   Voicemail message received from patient. Return call placed to patient. Patient immediately became tearful during call and voiced that things were not going well. She reports that she is having multiple symptoms and issues. She is just returning home from oncology appt. She states that she continues to have ongoing swelling in her legs and "can't get rid of them." She voices that her BP continues to drop and she keeps "having passing out spells." She voices that her BP normally runs in the 140s but lately it has been in the 32Y systolic and she is symptomatic. She is taking Losaratan and Metoprolol which she feels is causing some of her issues. She voices that she was being treated for a UTI and that her "urine is still cloudy." She is able to voice that her blood sugars are controlled at present and ranging in the low 100s. Blood sugar this am was 116. 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 Patient voices that she is taking over 20 meds and has a lot of meds that are expensive. She reports that her husband helps her manage her meds. RN CM discussed [facility] services with patient. She was glad to accept any help and support that she is able to receive.    Plan: RN CM will refer to [facility] community nurse for further in home eval/assessment of care needs and symptom mgmt. RN CM will send [place] referral for polypharmacy med review and possible med assistance.    Enzo Montgomery, RN,BSN,CCM [facility] Telephonic Care Management Coordinator Direct Phone: [phone] Toll Free: [phone] Fax: [phone]

## 2017-08-15 NOTE — Progress Notes (Signed)
[place]  [phone]) [phone] Fax:([phone]) [phone]  Clinic Follow Up Note   Patient Care Team: Redmond School, MD as PCP - General (Internal Medicine) Elmarie Shiley, MD as Consulting Physician (Nephrology) Leonie Man, MD as Consulting Physician (Cardiology) Alphonsa Overall, MD as Consulting Physician (General Surgery) Kyung Rudd, MD as Consulting Physician (Radiation Oncology) Truitt Merle, MD as Consulting Physician (Hematology) Delice Bison, Charlestine Massed, NP as Nurse Practitioner (Hematology and Oncology) Tania Ade, Tomasa Blase, RN as [facility]   Date of Service:  08/15/2017   CHIEF COMPLAINTS:  F/u for Bilateral invasive breast cancer  Oncology History   Cancer Staging Malignant 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, estrogen receptor positive ([place]) Staging form: Breast, AJCC 8th Edition - Clinical: Stage IA (cT1c, cN0, cM0, G1, ER: Positive, PR: Positive, HER2: Negative) - Unsigned       Malignant 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, estrogen receptor positive ([place])   10/20/2016 Initial Biopsy    Diagnosis Breast, right, needle core biopsy, 11:30 o'clock - INVASIVE DUCTAL CARCINOMA,      10/20/2016 Initial Diagnosis    Malignant 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, estrogen receptor positive ([place])      10/20/2016 Receptors her2    Estrogen Receptor: 100%, POSITIVE, STRONG STAINING INTENSITY Progesterone Receptor: 100%, POSITIVE, STRONG STAINING INTENSITY Proliferation Marker Ki67: 5% HER2 NEGATIVE       10/20/2016 Mammogram    Diagnostic mammogram showed persistent distortion corresponds to a vaguely palpable irregular mass and is suspicious for malignancy.Targeted ultrasound is performed, showing an irregular hypoechoic mass with posterior acoustic shadowing in the 11:30 o'clock location of the right breast 4 cm from the nipple which measures  0.9 x 0.9 x0.8 cm. There is associated internal vascularity. Evaluation of the axilla is negative for adenopathy.      11/01/2016 Imaging    B/l BREAST MRI: IMPRESSION: 1. There is a 1.5 cm biopsy proven malignancy in the upper slightly outer quadrant of the right breast. No additional sites of disease are identified.  2. There is an indeterminate 6 mm enhancing mass in the upper-outer left breast.      11/11/2016 Pathology Results    Diagnosis Breast, left, needle core biopsy, upper outer - INVASIVE DUCTAL CARCINOMA. - ATYPICAL LOBULAR HYPERPLASIA. - FIBROCYSTIC CHANGES AND PSEUDOANGIOMATOUS STROMAL HYPERPLASIA ([place]).      11/11/2016 Receptors her2     Estrogen Receptor: 100%, POSITIVE, STRONG STAINING INTENSITY Progesterone Receptor: 10%, POSITIVE, WEAK STAINING INTENSITY Proliferation Marker Ki67: 2% HER2 NEGATIVE       12/03/2016 Genetic Testing    Germline genetic testing was performed through [facility]'s Common Hereditary Cancers Panel + [facility]'s Melanoma Panel. This custom panel includes analysis of the following 51 genes: APC, ATM, AXIN2, BAP1, BARD1, BMPR1A, BRCA1, BRCA2, BRIP1, CDH1, CDK4, CDKN2A, CHEK2, CTNNA1, DICER1, EPCAM, GREM1, HOXB13, KIT, MEN1, MITF, MLH1, MSH2, MSH3, MSH6, MUTYH, NBN, NF1, NTHL1, PALB2, PDGFRA, PMS2, POLD1, POLE, POT1, PTEN, RAD50, RAD51C, RAD51D, RB1, SDHA, SDHB, SDHC, SDHD, SMAD4, SMARCA4, STK11, TP53, TSC1, TSC2, and VHL.  A variant of uncertain significance (VUS) was noted in BARD1.       12/14/2016 Miscellaneous    Oncotype Recurrence score of 16 10-year risk of distant recurrence with Tamoxifen alone is 10%      12/14/2016 Surgery    BILATERAL BREAST LUMPECTOMY WITH BILATERAL  RADIOACTIVE SEED AND BILATERAL AXILLARY  SENTINEL LYMPH NODE BIOPSY ERAS PATHWAY by Dr. Lucia Gaskins on 12/14/16  12/14/2016 Pathology Results    Diagnosis 12/14/16 1. Breast, lumpectomy, Left w/seed - INVASIVE DUCTAL CARCINOMA, 1 CM. - ATYPICAL LOBULAR  HYPERPLASIA ([place]). - PREVIOUS BIOPSY SITE. - INVASIVE CARCINOMA 0.2 CM FROM LATERAL MARGIN. - ATYPICA LOBULAR HYPERPLASIA LESS THAN 0.1 CM FROM LATERAL MARGIN. 2. Breast, lumpectomy, Right w/seed - INVASIVE DUCTAL CARCINOMA, 1.1 CM. - FIBROCYSTIC CHANGES. - PREVIOUS BIOPSY SITE. - INVASIVE CARCINOMA 1.1 CM FROM POSTERIOR MARGIN. 3. Lymph node, sentinel, biopsy, Right Axillary Contents - BENIGN FIBROADIPOSE TISSUE. - NO LYMPH NODE TISSUE OR MALIGNANCY. 4. Lymph node, sentinel, biopsy, Left Axillary - ONE BENIGN LYMPH NODE (0/1).       01/17/2017 - 02/11/2017 Radiation Therapy    1. The Left breast was treated to 42.5 Gy in 17 fractions of 2.5 Gy.  2. The Right breast was treated to 42.5 Gy in 17 fractions of 2.5 Gy.  3. The Right breast was boosted to 7.5 Gy in 3 fractions of 2.5 Gy.  4. The Left breast was boosted to 7.5 Gy in 3 fractions of 2.5 Gy.          02/2017 -  Anti-estrogen oral therapy    Exemestane daily       HISTORY OF PRESENTING ILLNESS:  [name] 77 y.o. female is here because of newly diagnosed bilateral breast cancer. She reports to not feeling the lumps herself and cancer was found on screening mammogram followed by MR. 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 In August of 2018, she was also found to have invasive ductal carcinoma in the right breast as well. 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 She has a family history of breast cancer including her sister and daughter, both in their early 55s.   She began taking estrogen at 59 following a hysterectomy and bilateral salpingo-oophorectomy due to an ectopic pregnancy and ovarian cyst. 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   She is very active at home as she cleans her house and mows her own line. Per her husband, she is "very hyper and has trouble with her balance." She is otherwise  well at home.   CURRENT THERAPY:  Adjuvant Exemestane started on 03/07/17  INTERVAL HISTORY:   [name] is here for a follow up for her bilateral breast cancer. She presents to the clinic today accompanied by her husband. She notes she went to ED on 08/01/17 for congestive heart failure. She notes she did her feet surgery but wound still has not healed. She wear footies and sandals. She brought a log of her BG and BP. She feels emotional and overwhelmed with the inconsistency in her levels. She plans to see [name] in 09/21/17.    On review of symptoms, pt notes she has significant ankle and feet swelling bilaterally. She notes she is tired majority of the time but still gets work done. She notes yesterday her BP was low and felt like she was going to pass out.     MEDICAL HISTORY:  Past Medical History:  Diagnosis Date  . Anxiety   . Arthritis     osteoarthritis  . Breast cancer in female [facility])    Bilateral  . CAD S/P percutaneous coronary angioplasty 2007; March 2011   [employer] 3.0 mm 20 mm postdilated 3.6 mm in early mid LAD; status post ISR Cutting Balloon PTCA and March 11 along with  PCI of distal mid lesion with a 3.0 mm 12 mm MultiLink vision BMS; the proximal stent causes jailing of SP1 and SP2 with ostial 70-80% lesions  . Cancer (HCC)    Melanoma, Squamous cell Carcinoma  . CHF (congestive heart failure) ([place])   . Chronic kidney disease    stage 2   . Dementia    mild  . Diabetes mellitus type 2 with neurological manifestations ([place])   . Dyslipidemia, goal LDL below 70   . Full dentures   . Genetic testing 12/03/2016   Germline genetic testing was performed through [facility]'s Common Hereditary Cancers Panel + [facility]'s Melanoma Panel. This custom panel includes analysis of the following 51 genes: APC, ATM, AXIN2, BAP1, BARD1, BMPR1A, BRCA1, BRCA2, BRIP1, CDH1, CDK4, CDKN2A, CHEK2, CTNNA1, DICER1, EPCAM, GREM1, HOXB13, KIT, MEN1, MITF, MLH1, MSH2, MSH3, MSH6, MUTYH,  NBN, NF1, NTHL1, PALB2, PDGFRA, PMS2, POLD1, POL  . GERD (gastroesophageal reflux disease)   . Headache(784.0)   . History of hematuria    Followed by Dr. Gaynelle Arabian  . Hypertension, essential, benign   . Nausea & vomiting 10/2015  . Peripheral neuropathy   . Stroke ([place])   . TIA (transient ischemic attack)    multiple in the past  . Wears glasses     SURGICAL HISTORY: Past Surgical History:  Procedure Laterality Date  . ABDOMINAL HYSTERECTOMY    . APPENDECTOMY    . BLADDER SUSPENSION    . BREAST LUMPECTOMY WITH RADIOACTIVE SEED AND SENTINEL LYMPH NODE BIOPSY Bilateral 12/14/2016   Procedure: BILATERAL BREAST LUMPECTOMY WITH BILATERAL  RADIOACTIVE SEED AND BILATERAL AXILLARY  SENTINEL LYMPH NODE BIOPSY ERAS PATHWAY;  Surgeon: Alphonsa Overall, MD;  Location: [facility];  Service: General;  Laterality: Bilateral;  PECTORAL BLOCK  . CARDIAC CATHETERIZATION  02/24/2006   85% stenosis in the proximal portion of LAD-arrangements made for PCI on 02/25/2006  . CARDIAC CATHETERIZATION  02/25/2006   80% LAD lesion stented with a 3x33m Liberte stent resulting in reduction of 80% lesion to 0% residual  . CARDIAC CATHETERIZATION  04/26/2006   Medical management  . CARDIAC CATHETERIZATION  06/24/2009   60-70% re-stenosis in the proximal LAD. A 3.25x15 cutting balloon, 3 inflations - 14atm-38sec, 13atm-39sec, and 12atm-40sec reduced to less than 10%. 60% stenosis of the mid/distal LAD stented with a 3x113mMultilink stent.  . Marland KitchenARDIAC CATHETERIZATION  07/09/2009   Medical management  . CAROTID DOPPLER  06/24/2009   40-59% R ICA stenosis. No significant ICA stenosis noted  . CHOLECYSTECTOMY    . DILATION AND CURETTAGE OF UTERUS    . JOINT REPLACEMENT Left   . LESION REMOVAL Right 03/11/2015   Procedure:  EXCISION OF RIGHT PRE TIBIAL LESION;  Surgeon: JaJudeth HornMD;  [facility] Service: General;  Laterality: Right;  . MASS EXCISION Left 06/10/2014   Procedure: EXCISION LEFT  LOWER LEG LESION;  Surgeon: JaDoreen SalvageMD;  Location: [facility] Service: General;  Laterality: Left;  . Marland KitchenASS EXCISION Left 09/05/2015   Procedure: EXCISION OF LEFT FOREARM  MASS;  Surgeon: JaJudeth HornMD;  [facility] Service: General;  Laterality: Left;  . NM MYOVIEW LTD  11/16/2011   5 beats a PVC during recovery.  No skin or infarction.  Reached 5 METs, EKG negative for ischemia   . SHOULDER ARTHROSCOPY  10/15  . SHOULDER ARTHROSCOPY  1995   left  . TEE WITHOUT CARDIOVERSION N/A 08/03/2017   Procedure: TRANSESOPHAGEAL ECHOCARDIOGRAM (TEE);  Surgeon: Skeet Latch, MD;  Location: [place];  Service: Cardiovascular;  Laterality: N/A;  . TENDON REPAIR Left 05/12/2016   Procedure: Left Anterior Tibial Tendon Reconstruction;  Surgeon: Newt Minion, MD;  Location: [facility];  Service: Orthopedics;  Laterality: Left;  . TOTAL KNEE ARTHROPLASTY  2005   left  . TRANSTHORACIC ECHOCARDIOGRAM  10/2015   EF 55-60 %. Mild LVH. Mild pulmonary hypertension. Normal valves.  . TRANSTHORACIC ECHOCARDIOGRAM  12/31/2009/July 2015   IsLV size & function, Gr 1 DD w/ mild TR.;; b)09/2013: 55-60%. Grade 1 DD, mild aortic sclerosis  . WRIST ARTHROPLASTY  2010   cancer lt wrist    SOCIAL HISTORY: Social History   Socioeconomic History  . Marital status: Married    Spouse name: [name]  . Number of children: 3  . Years of education: 29  . Highest education level: Not on file  Occupational History  . Occupation: retired    Fish farm manager: RETIRED  Social Needs  . Financial resource strain: Not on file  . Food insecurity:    Worry: Not on file    Inability: Not on file  . Transportation needs:    Medical: Not on file    Non-medical: Not on file  Tobacco Use  . Smoking status: Never Smoker  . Smokeless tobacco: Never Used  Substance and Sexual Activity  . Alcohol use: No  . Drug use: No  . Sexual activity: Never  Lifestyle  . Physical activity:    Days per  week: Not on file    Minutes per session: Not on file  . Stress: Not on file  Relationships  . Social connections:    Talks on phone: Not on file    Gets together: Not on file    Attends religious service: Not on file    Active member of club or organization: Not on file    Attends meetings of clubs or organizations: Not on file    Relationship status: Not on file  . Intimate partner violence:    Fear of current or ex partner: Not on file    Emotionally abused: Not on file    Physically abused: Not on file    Forced sexual activity: Not on file  Other Topics Concern  . Not on file  Social History Narrative   She is a married mother of 19, grandmother of 2.  Usually very active and out about the house.  But currently over last 6-8 weeks has been incapacitated.  She never smoked and does not drink alcohol.    FAMILY HISTORY: Family History  Problem Relation Age of Onset  . Diabetes Mother   . Hypertension Mother   . Kidney disease Mother   . Hypertension Father   . Emphysema Father   . Heart attack Father   . Heart disease Father   . Arthritis Sister   . Diabetes Sister   . Hypertension Sister   . Breast cancer Sister 32       treated with mastectomy  . Cervical cancer Sister 54  . Hypertension Brother   . Hyperlipidemia Brother   . Diabetes Brother   . Stroke Brother   . Diabetes Sister   . Hypertension Sister   . Stomach cancer Sister 36  . Hypertension Brother   . ALS Brother        d.62s  . Breast cancer Daughter 20       triple negative treated with lumpectomy, chemo, radiation  . Heart attack Daughter  45       d.45  . COPD Daughter   . Cancer Paternal Aunt        unspecified type  . Cancer Paternal Uncle        unspecified type  . Cancer Paternal Uncle        unspecified type    ALLERGIES:  is allergic to lipitor [atorvastatin]; lyrica [pregabalin]; nsaids; reglan [metoclopramide]; and ultram [tramadol].  MEDICATIONS:  Current Outpatient Medications    Medication Sig Dispense Refill  . allopurinol (ZYLOPRIM) 100 MG tablet Take 1 tablet (100 mg total) by mouth daily. 30 tablet 6  . Biotin w/ Vitamins C & E (HAIR SKIN & NAILS GUMMIES PO) Take 2 tablets by mouth daily.     . Calcium Carb-Cholecalciferol (CALCIUM 600 + D PO) Take 1 tablet by mouth at bedtime.     . Cholecalciferol 2000 units CAPS Take 2,000 Units by mouth daily.    . clopidogrel (PLAVIX) 75 MG tablet Take 1 tablet (75 mg total) by mouth daily. Due for f/u appt, please call office 90 tablet 0  . COD LIVER OIL PO Take 415 mg by mouth daily.    Marland Kitchen CRANBERRY PO Take 1 capsule by mouth daily.    . diazepam (VALIUM) 5 MG tablet Take 5 mg by mouth daily as needed for anxiety.    . diphenhydrAMINE (BENADRYL) 50 MG capsule Take 50 mg by mouth at bedtime as needed for sleep.     Marland Kitchen exemestane (AROMASIN) 25 MG tablet Take 1 tablet (25 mg total) daily after breakfast by mouth. 90 tablet 3  . Fish Oil-Krill Oil (KRILL OIL PLUS PO) Take 1 capsule by mouth daily.     Marland Kitchen gabapentin (NEURONTIN) 300 MG capsule Take 600 mg by mouth at bedtime.     . Iron-FA-B Cmp-C-Biot-Probiotic (FUSION PLUS) CAPS takes 1 tablet daily  9  . losartan (COZAAR) 100 MG tablet take 1 tablet daily  3  . Melatonin 5 MG CAPS Take 5 mg by mouth at bedtime as needed (sleep).     . metFORMIN (GLUCOPHAGE) 500 MG tablet Take by mouth 2 (two) times daily with a meal.    . Methylfol-Methylcob-Acetylcyst (CEREFOLIN NAC) 6-2-600 MG TABS TAKE 1 CAPLET BY MOUTH ONCE DAILY. 90 each 3  . metoprolol succinate (TOPROL XL) 50 MG 24 hr tablet TAKE ONE-HALF (1/2) TABLET DAILY 45 tablet 0  . mirtazapine (REMERON) 15 MG tablet Take 15 mg by mouth at bedtime.    . pantoprazole (PROTONIX) 40 MG tablet TAKE 1 TABLET DAILY (CONTACT OFFICE FOR ADDITIONAL REFILLS, FINAL ATTEMPT) 90 tablet 0  . pramipexole (MIRAPEX) 0.25 MG tablet TAKE 1 TABLET AT 4:00 P.M. AND 2 TABLETS (0.5 MG) AT BEDTIME 270 tablet 1  . Resveratrol (RESERVAPAK PO) Take 1 tablet  by mouth every evening.     . rosuvastatin (CRESTOR) 20 MG tablet Take 20 mg by mouth at bedtime.     No current facility-administered medications for this visit.     REVIEW OF SYSTEMS:   Constitutional: Denies fevers, chills or abnormal night sweats (+) fatigue, weakness Eyes: Denies blurriness of vision, double vision or watery eyes Ears, nose, mouth, throat, and face: Denies mucositis or sore throat Respiratory: Denies cough, dyspnea or wheezes Cardiovascular: Denies palpitation, chest discomfort (+) significant lower extremity swelling to lower leg to feet B/l Gastrointestinal:  Denies nausea, heartburn or change in bowel habits Skin: Denies abnormal skin rashes Breast: (+) soreness in right breast form surgery   Lymphatics:  Denies new lymphadenopathy or easy bruising MSK: (+) Arthritis  Neurological:Denies numbness, tingling or new weaknesses  Behavioral/Psych: Mood is stable, no new changes  All other systems were reviewed with the patient and are negative.  PHYSICAL EXAMINATION:  ECOG PERFORMANCE STATUS: 1  Vitals:   08/15/17 1115  BP: 137/85  Pulse: 77  Resp: 19  Temp: 97.8 F (36.6 C)  SpO2: 99%   Filed Weights   08/15/17 1115  Weight: 184 lb 11.2 oz (83.8 kg)    GENERAL:alert, no distress and comfortable SKIN: skin color, texture, turgor are normal, no rashes or significant lesions EYES: normal, conjunctiva are pink and non-injected, sclera clear OROPHARYNX:no exudate, no erythema and lips, buccal mucosa, and tongue normal  NECK: supple, thyroid normal size, non-tender, without nodularity LYMPH:  no palpable lymphadenopathy in the cervical, axillary or inguinal LUNGS: clear to auscultation and percussion with normal breathing effort HEART: regular rate & rhythm and no murmurs, (+) b/l pitting edema is on her feet and lower extremity ABDOMEN:abdomen soft, non-tender and normal bowel sounds Musculoskeletal:no cyanosis of digits and no clubbing  PSYCH: alert &  oriented x 3 with fluent speech NEURO: no focal motor/sensory deficits Breasts: Breast inspection showed them to be symmetrical with no nipple discharge. S/p Bilateral breast lumpectomy: (+) Palpation of the breasts and axilla revealed no obvious mass that I could appreciate. Surgical scars healed well.   LABORATORY DATA:  I have reviewed the data as listed CBC Latest Ref Rng & Units 08/15/2017 08/01/2017 04/12/2017  WBC 3.9 - 10.3 K/uL 6.9 8.5 6.3  Hemoglobin 11.6 - 15.9 g/dL 11.9 12.1 12.4  Hematocrit 34.8 - 46.6 % 35.8 37.6 38.0  Platelets 145 - 400 K/uL 211 274 193   CMP Latest Ref Rng & Units 08/15/2017 08/03/2017 08/02/2017  Glucose 70 - 140 mg/dL 92 119(H) 110(H)  BUN 7 - 26 mg/dL 57(H) 27(H) 26(H)  Creatinine 0.60 - 1.10 mg/dL 2.22(H) 1.75(H) 1.80(H)  Sodium 136 - 145 mmol/L 142 140 139  Potassium 3.5 - 5.1 mmol/L 4.0 4.0 3.6  Chloride 98 - 109 mmol/L 105 101 101  CO2 22 - 29 mmol/L '26 28 28  '$ Calcium 8.4 - 10.4 mg/dL 10.0 9.9 9.5  Total Protein 6.4 - 8.3 g/dL 7.1 - -  Total Bilirubin 0.2 - 1.2 mg/dL 0.3 - -  Alkaline Phos 40 - 150 U/L 92 - -  AST 5 - 34 U/L 22 - -  ALT 0 - 55 U/L 19 - -    PATHOLOGY  Diagnosis 12/14/16 1. Breast, lumpectomy, Left w/seed - INVASIVE DUCTAL CARCINOMA, 1 CM. - ATYPICAL LOBULAR HYPERPLASIA ([place]). - PREVIOUS BIOPSY SITE. - INVASIVE CARCINOMA 0.2 CM FROM LATERAL MARGIN. - ATYPICA LOBULAR HYPERPLASIA LESS THAN 0.1 CM FROM LATERAL MARGIN. 2. Breast, lumpectomy, Right w/seed - INVASIVE DUCTAL CARCINOMA, 1.1 CM. - FIBROCYSTIC CHANGES. - PREVIOUS BIOPSY SITE. - INVASIVE CARCINOMA 1.1 CM FROM POSTERIOR MARGIN. 3. Lymph node, sentinel, biopsy, Right Axillary Contents - BENIGN FIBROADIPOSE TISSUE. - NO LYMPH NODE TISSUE OR MALIGNANCY. 4. Lymph node, sentinel, biopsy, Left Axillary - ONE BENIGN LYMPH NODE (0/1). Microscopic Comment 1. BREAST, INVASIVE TUMOR Procedure: Localized lumpectomy and one sentinel lymph node. Laterality: Left breast. Tumor  Size: 1 cm. Histologic Type: Ductal. Grade: I. Tubular Differentiation: 1. Nuclear Pleomorphism: 2. Mitotic Count: 1. Ductal Carcinoma in Situ (DCIS): N/A. Extent of Tumor: Skin: N/A. Nipple: N/A. Skeletal muscle: N/A. 1 of 4 FINAL for Kohl[place]yer, Irasema G ([id number]) Microscopic Comment(continued) Margins: Free of tumor. Invasive carcinoma, distance  from closest margin: 0.3 cm from lateral margin. DCIS, distance from closest margin: N/A. Regional Lymph Nodes: Number of Lymph Nodes Examined: 1. Number of Sentinel Lymph Nodes Examined: 1. Lymph Nodes with Macrometastases: 0. Lymph Nodes with Micrometastases: 0. Lymph Nodes with Isolated Tumor Cells: 0. Breast Prognostic Profile: Case [id number]. Estrogen Receptor: 100%, positive, strong staining. Progesterone Receptor: 10%, positive, weak staining. Her2: Negative, ratio 1.54. Ki-67: 2%. Best tumor block for sendout testing: 1E. Pathologic Stage Classification (pTNM, AJCC 8th Edition): Primary Tumor (pT): pT1b. Regional Lymph Nodes (pN): pN0. Distant Metastases (pM): pMX. 2. BREAST, INVASIVE TUMOR Procedure: Localized lumpectomy. Laterality: Right breast. Tumor Size: 1.1 cm. Histologic Type: Ductal. Grade: I. Tubular Differentiation: 1. Nuclear Pleomorphism: 2. Mitotic Count: 1. Ductal Carcinoma in Situ (DCIS): N/A. Extent of Tumor: Skin: N/A. Nipple: N/A. Skeletal muscle: N/A. Margins: Free of tumor. Invasive carcinoma, distance from closest margin: 1.1 cm from posterior margin. DCIS, distance from closest margin: N/A. Regional Lymph Nodes: Number of Lymph Nodes Examined: 0. Number of Sentinel Lymph Nodes Examined: 0. Lymph Nodes with Macrometastases: N/A. Lymph Nodes with Micrometastases: N/A. Lymph Nodes with Isolated Tumor Cells: N/A. Breast Prognostic Profile: Case [id number]. Estrogen Receptor: 100%, positive, strong staining. Progesterone Receptor: 100%, positive, strong staining. Her2: Negative, ratio  1.28. Ki-67: 5%. Best tumor block for sendout testing: 2F. 2 of 4 FINAL for [place], Yekaterina G ([id number]) Microscopic Comment(continued) Pathologic Stage Classification (pTNM, AJCC 8th Edition): Primary Tumor (pT): pT1c. Regional Lymph Nodes (pN): pNX. Distant Metastases (pM): pMX.    Diagnosis 11/11/2016 Breast, left, needle core biopsy, upper outer - INVASIVE MAMMARY CARCINOMA. - ATYPICAL LOBULAR HYPERPLASIA. - FIBROCYSTIC CHANGES AND PSEUDOANGIOMATOUS STROMAL HYPERPLASIA ([place]).  Diagnosis 10/10/2016 Breast, right, needle core biopsy, 11:30 o'clock - INVASIVE DUCTAL CARCINOMA, SEE COMMENT.  Estrogen Receptor: 100%, POSITIVE, STRONG STAINING INTENSITY Progesterone Receptor: 100%, POSITIVE, STRONG STAINING INTENSITY Proliferation Marker Ki67: 5% HER2 - NEGATIVE  Diagnosis 09/05/15 Skin , Left forearm - INVASIVE WELL DIFFERENTIATED SQUAMOUS CELL CARCINOMA, MEASURING 0.6 CM IN GREATEST DIMENSION. - SOLAR ELASTOSIS IS PRESENT. - MARGINS ARE NEGATIVE. - SEE ONCOLOGY TEMPLATE.    Oncotype 12/14/16    RADIOGRAPHIC STUDIES: I have personally reviewed the radiological images as listed and agreed with the findings in the report. Dg Chest 2 View  Result Date: 08/01/2017 CLINICAL DATA:  Fatigue and leg swelling for 1 day. Stage III renal failure. EXAM: CHEST - 2 VIEW COMPARISON:  06/09/2017 FINDINGS: Cardiomediastinal silhouette is normal. Mediastinal contours appear intact. Tortuosity and calcific atherosclerotic disease of the aorta. There is no evidence of focal airspace consolidation, pleural effusion or pneumothorax. Osseous structures are without acute abnormality. Soft tissues are grossly normal. IMPRESSION: No active cardiopulmonary disease. Electronically Signed   By: Fidela Salisbury M.D.   On: 08/01/2017 14:11   Dg Lumbar Spine 2-3 Views  Result Date: 08/02/2017 CLINICAL DATA:  Bilateral flank pain since mastectomies. EXAM: LUMBAR SPINE - 2-3 VIEW COMPARISON:  Coronal and  sagittal reconstructed images through the lumbar spine from an abdominal and pelvic CT scan of November 16, 2015 FINDINGS: The lumbar vertebral bodies are preserved in height. The disc space heights are reasonably well-maintained with exception of L5-S1 where there is moderate narrowing with a vacuum phenomenon. The pedicles and transverse processes are intact. There is minimal anterolisthesis of L4 with respect L5 amounting to approximately 4 mm. There is calcification in the wall of the aortic arch. 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 No compression fracture. Minimal anterolisthesis of L4 with respect to L5. The observed  soft tissues of the abdomen are unremarkable. Electronically Signed   By: David  Martinique M.D.   On: 08/02/2017 15:16     Bone Density Scan: 02/24/17 ASSESSMENT: The BMD measured at Femur Neck Left is 0.810 g/cm2 with a T-score of -1.6.  Diagnostic mammogram on 10/20/16  showed persistent distortion corresponds to a vaguely palpable irregular mass and is suspicious for malignancy.Targeted ultrasound is performed, showing an irregular hypoechoic mass with posterior acoustic shadowing in the 11:30 o'clock location of the right breast 4 cm from the nipple which measures 0.9 x 0.9 x0.8 cm. There is associated internal vascularity. Evaluation of the axilla is negative for adenopathy.   Bone Density Scan 10/28/14 ASSESSMENT: BMD as determined from Femur Neck Right is 0.818 g/cm2 with a T-Score of -1.6.   ASSESSMENT & PLAN: 77 y.o. postmenopausal woman, with past medical history of coronary artery disease, hypertension, diabetes, CKD, presented with screening discovered bilateral breast cancer.  1.  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  -She underwent Bilateral Lumpectomy and bilateral SLNB by Dr. Lucia Gaskins on 12/14/16.  I reviewed her surgical pathology results with her in details, surgical margins were negative.  - Her Oncotype results returned low risk, recurrence score 16, which predicts 10-year of distant recurrence 10% with tamoxifen, she will not need chemotherapy.  -She started radiation with Dr. Lisbeth Renshaw on 01/17/17 - 02/11/17, tolerated well.  -Given the strong ER and PR positivity, I do recommend adjuvant aromatase inhibitor to reduce her risk of cancer recurrence. The potential benefit and side effects were discussed with her in great details. She is interested, and we'll start after she completes radiation. -I suggest exemestane due to her arthritis. If her exemestane co-pay is too high we can try anastrozole next.  -Started Exemestane in 02/2017. She is doing well with no side effects -I discussed focusing on heart and kidney issues with her physicians. I encouraged her to remain active but make sure she is not on her feet for too long and to not overdue it. When she sits she should elevate her feet.  -I suggest she continue low salt diet and suggest she eat more fresh fruit and to drink about 1 L water a day.  -From a breast cancer standpoint she is clinically doing well. Lab reviewed, her CBC and CMP are within normal limits except her Cr at 2.22 and Bun at 57. Her physical exam was unremarkable. There is no clinical concern for recurrence -Continue Exemestane. I discussed if her other health issues worsen she is fine to stop her exemestane given her early stage breast cancer.  -I will order her first surveillance mammogram for 09/2017.  -F/u in 6 months or sooner if needed. She will see Dr. Lucia Gaskins in 3 months    2. Genetics -Due to her family history of breast cancer, she was referred to genetics. she underwent genetic testing   -She is positive for variant of uncertain significance identified in [place].  Negative for other gene mutations.   3. HTN, DM, CAD and CHF with LE edema  -She'll continue medication and follow-up with her primary care physician -DM controlled  -She is on losartan by nephologist ([name]), metoprolol by her cardiologist ([name]) and on doubled Bumex by her PCP (Dr. Gerarda Fraction) -I advised when her  systolic BP drops below 95 she should hold one of her BP medications. -I advised her to speak with PCP or cardiologist to adjust medications. I will copy my note to her physicians. She should see [name] soon.   4. CKD, Stage IV -She will continue to follow up with [name] -CR at 2.22 and BUN at 34 today (08/15/17), based on this she has Stage IV CKD  5. Arthritis  -We discussed her trying exemestane and if she cannot tolerate joint pain we can switch her to another AI.   6. Osteopenia  -2016 bone density, right femur shows osteopenia  -DEXA from 02/24/17 results with a T score of -1.6 -continue calcium and vit D   PLAN -Continue Exemestane  -Lab and f/u in 6 months   -I will copy her PCP Dr. Gerarda Fraction, and cardiologist [name], and nephrologist [name], regarding her intermittent hypotension, worsening creatinine.  I suggest patient to follow-up with them to manage her diuretics and blood pressure medication.   Orders Placed This Encounter  Procedures  . MM DIAG BREAST TOMO BILATERAL    Standing Status:   Future    Standing Expiration Date:   08/16/2018    Order Specific Question:   Reason for Exam (SYMPTOM  OR DIAGNOSIS REQUIRED)    Answer:   screening    Order Specific Question:   Preferred imaging location?    Answer:   [facility]    All questions were answered. The patient knows to call the clinic with any problems, questions or concerns. I spent 20 minutes counseling the patient face to face. The total time spent in the appointment was 25 minutes and more than 50% was on counseling.  This document serves as a record of services personally  performed by Truitt Merle, MD. It was created on her behalf by Joslyn Devon, a trained medical scribe. The creation of this record is based on the scribe's personal observations and the provider's statements to them.    I have reviewed the above documentation for accuracy and completeness, and I agree with the above.       Truitt Merle, MD 08/15/2017

## 2017-08-16 ENCOUNTER — Telehealth: Payer: Self-pay | Admitting: Cardiology

## 2017-08-16 ENCOUNTER — Other Ambulatory Visit: Payer: Self-pay | Admitting: *Deleted

## 2017-08-16 NOTE — Telephone Encounter (Signed)
Spoke with Maudie Mercury from [facility] who report pt was seen by her pcp on 5/10 and told if she gains 3 lbs to double her Bumex. She reports pt dry weight is 181.5 and on 5/11 pt was 184 so she increased her dose. She has currently been taking a double dose since 5/11. She states her weight as of today is 183.5. She denies any SOB but reports she has bilateral leg edema. Routing to MD for recommendation.

## 2017-08-16 NOTE — Patient Outreach (Signed)
[place] [facility]) Care Management  08/16/2017  [name] [birth year] [id number]   Transition of care call EMMI red :   5/20 Received referral from telephonic care management  Patient with hospital admission 5/6-5/8 PMH: includes but not limited to CKD, Diabetes, HTN, Heart Failure, breast cancer.    Successful outreach call to patient , explained reason for the call., HIPAA information verified.  Patient discussed she is active at home , exercises, does her own housework, yard work using [employer], she does the cooking. She discussed having supportive husband and granddaughter.   Patient states her husband prepares her medication but she know what to take, she then placed her husband on the phone to review patient medications.  Husband discussed patient discharge weight being 181.5, patient had office visit with PCP on 5/10 and asked PCP what to do if weight goes up by 2 to 3 pounds and states he said double up on bumex, Husband states patient weight on 5/11 was 184 and they started taking Bumex 2 mg 2 tablet daily and has continued that. Patient reports she does not use salt in foods.  Patient discussed she continues with lower leg edema, has not increased, today's weight is 183.5, she denies increase in shortness of breath.   Patient discussed other medical conditions , of having spells at times where she feels as if she may pass out, she has had this problem for a few years, when it occurs her husband checks her blood pressure and sometimes reading is in 80/52, patient denies having episode this week, recent blood pressure 132/74. Patient reports she has has diabetes that is usually controlled and this am blood sugar 116.   Assessment : Patient is agreeable to [facility] community care management follow , explained transition of care program.   Plan Placed call to [name] office, spoke with nurse Delrae Rend to explain patient concern with lower leg edema, continuing bumex  at double dose per PCP and patient interest in earlier appointment with [name] , next visit scheduled 6/26.  Will follow patient for transition of care , next call in a week.  Will send PCP barrier letter.    [facility] CM Care Plan Problem One     Most Recent Value  Care Plan Problem One  Recent hospital admission related to Heart failure   Role Documenting the Problem One  Care Management [place] for Problem One  Active  [facility] Long Term Goal   Patient will not experience a hospital admission in the next 90 days   [facility] Long Term Goal Start Date  08/16/17  Interventions for Problem One Long Term Goal  Discuss current clinical state, reviewed importance of daily lifestyle behavior, following low salt diet, weighing daily, taking medications as prescribed, and being as active as tolerated.   [facility] CM Short Term Goal #1   Over the next 30 days patient with continue to weigh daily and keep a record   [facility] CM Short Term Goal #1 Start Date  08/17/17  Interventions for Short Term Goal #1  Discussed benefits of daily weights to identify sudden weight changes, reviewed best time of day to weigh    [facility] CM Short Term Goal #2   Over the next 30 days patient will be able to report at 3 symptoms of heart failure and action plan zone   [facility] CM Short Term Goal #2 Start Date  08/17/17  Interventions for Short Term Goal #2  Discussed  yellow zone symptoms of heart failure.       [name], RN, [place] Management Coordinator  [phone]- Mobile [phone]- Toll Free Main Office

## 2017-08-16 NOTE — Telephone Encounter (Signed)
Recommend moderate wgt support hose for LE Edema -- Continue with Bumex standing with sliding scale.  Glenetta Hew

## 2017-08-17 ENCOUNTER — Encounter: Payer: Self-pay | Admitting: *Deleted

## 2017-08-17 ENCOUNTER — Other Ambulatory Visit: Payer: Self-pay | Admitting: *Deleted

## 2017-08-17 NOTE — Telephone Encounter (Signed)
SPOKE TO PATIENT .  INSTRUCTION GIVEN . PATIENT STATES SHE IS WEARING SUPPORT STOCKINGS AS WELL AS KEEPING FEET ELEVATED.   CONTINUE TO DO SLIDING SCALE UNTIL  RETURN TO DRY WEIGHT. SHE STATES   WEIGHT  RANGES STARTING TODAY AND LOOKING EACH DAY -[phone], 183.5,185.5,  PATIENT VERBALIZED UNDERSTANDING AND APPOINTMENT MOVED TO 09/01/17 AT 10:20 AM.  RN ALSO NOTIFIED KIM RN WITH [facility]

## 2017-08-17 NOTE — Patient Outreach (Signed)
[place] [facility]) Care Management  08/17/2017  [name] 1940/05/15 [id number]  Received message today  regarding  EMMI red alert dated 5/20. Alerts : new swelling , new problems and tired.  Placed call to patient regarding EMMI red alerts from 5/20, person answering phone states patient is not available.  Initial call was placed to patient on 5/21 discussed symptoms and concerns at that time and follow up call to PCP to address .  Plan  Will plan follow up call , on next week for transition of care.    [name], RN, [place] Management Coordinator  [phone]- Mobile [phone]- Toll Free Main Office

## 2017-08-18 ENCOUNTER — Other Ambulatory Visit: Payer: Self-pay | Admitting: Pharmacist

## 2017-08-18 NOTE — Patient Outreach (Signed)
[place] [facility]) Care Management  08/18/2017  [name] 1940-12-03 [id number]  77 year old female referred to [place] Management by EMMI red alert  [place] services requested for medication management and medication assistance.  PMHx includes, but not limited to, bilateral breast cancer, anxiety, CAD s/p stents, congestive diastolic heart failure (EF 60-65%), chronic kidney disease stage IV, diabetes mellitus type 2 with neurological manifestations, HTN, HLD, and history of TIAs. Patient recently hospitalized for acute on chronic heart failure.   Unsuccessful call attempt #1 to Ms. Ava Welke today.  Voicemail was full, and I was unable to leave a message.   Plan: I will outreach Ms. Ghazarian again early next week.  Unsuccessful outreach letter already mailed to patient on 08/15/2017.   Ralene Bathe, PharmD, [place] [phone]

## 2017-08-23 ENCOUNTER — Other Ambulatory Visit: Payer: Self-pay | Admitting: *Deleted

## 2017-08-23 ENCOUNTER — Encounter: Payer: Self-pay | Admitting: *Deleted

## 2017-08-23 NOTE — Patient Outreach (Signed)
[place] [facility]) Care Management  08/23/2017  [name] 07-16-1940 [id number]   Transition of care call  EMMI red alert : 5/26 , new swelling, shortness of breath  Successful outreach call to patient, HIPAA verified.  Patient discussed since she completed radiation therapy in Feb, related to breath cancer , she finds it harder to get back her energy and strength back., but she is trying, discussed getting exhausted easily and shortness of breath after activities and takes her a while to recuperate. .  Patient dicussed her weight today was 182.5 and remained in the 182-  184 range for the last 3 days.  Patient discussed she has continued swelling in lower legs,she is wearing compression hose and has not seen much change, states swelling comes and goes. Patient discussed limiting salt, does not add to foods, and limiting fluids to 1 liter as recommended by she states MD office.  Patient discussed her blood pressure reading last night was 92/52, checked after returning from trip to [facility] has not checked on this morning yet, that reading was  which a little lower than she normally runs , she denies having recent  episode of feeling faint.  Patient states she continues to take medication as prescribed by MD with help of her husband organizing it.   Patient discussed office visit with Kidney doctor on tomorrow.    Plan Will plan telephone outreach call in the next week as part of transition of care and discuss scheduling home visit.    [name], RN, [place] Management Coordinator  [phone]- Mobile [phone]- Toll Free Main Office

## 2017-08-24 DIAGNOSIS — N2581 Secondary hyperparathyroidism of renal origin: Secondary | ICD-10-CM | POA: Diagnosis not present

## 2017-08-24 DIAGNOSIS — D631 Anemia in chronic kidney disease: Secondary | ICD-10-CM | POA: Diagnosis not present

## 2017-08-24 DIAGNOSIS — I129 Hypertensive chronic kidney disease with stage 1 through stage 4 chronic kidney disease, or unspecified chronic kidney disease: Secondary | ICD-10-CM | POA: Diagnosis not present

## 2017-08-24 DIAGNOSIS — N184 Chronic kidney disease, stage 4 (severe): Secondary | ICD-10-CM | POA: Diagnosis not present

## 2017-08-25 ENCOUNTER — Other Ambulatory Visit: Payer: Self-pay

## 2017-08-25 NOTE — Patient Outreach (Signed)
Transition of care/ red emmi Today's Vitals   08/25/17 1440  Weight: 186 lb 8 oz (84.6 kg)    Placed call to patient who reports weight is up to 186.5 today from yesterday at 183.0.  Patient reports she saw [name] yesterday and was prescribed metolazone 2.5 mg and increased bumex for diuresis at home. Patient reports she took metolazone at 9am with no urine output. Reports husband cathed her an got out 350 cc of urin for the whole day.  Patient reports swelling and concern about decreased urinary output.  Patient reports new fluid restriction of 1.5 liters per day.  PLAN: reviewed concern with patient. Patient states she will call [name] and inform of weight gain and low urinary output. Will plan follow up call tomorrow.  Tomasa Rand, RN, BSN, CEN [facility] [employer] [phone]

## 2017-08-26 ENCOUNTER — Other Ambulatory Visit: Payer: Self-pay

## 2017-08-26 NOTE — Patient Outreach (Signed)
Transition of care/ red emmi:  Placed call to patient who reports she spoke with [name] about decreased urinary output. States that [name] gave her instructions and medications adjustments.  Patient reports today weight is unchanged and swelling is unchanged. Reports she had been able to void 1093 cc of urine today. Reports burning on urination and pain. Reports she has called primary MD office and requested an antibiotic for UTI.    Today's Vitals   08/26/17 1357  Weight: 186 lb 9.6 oz (84.6 kg)    PLAN: reviewed plan with patient. She is awaiting a call back from primary MD about UTI and she states if she does not hear from MD she will call [name] back and notify him. Denied needing any assistance with care coordinations.  Will update primary RN and patient will be contacted next week for transition of care.  Tomasa Rand, RN, BSN, CEN [facility] [employer] [phone]

## 2017-08-29 ENCOUNTER — Other Ambulatory Visit: Payer: Self-pay | Admitting: *Deleted

## 2017-08-29 NOTE — Patient Outreach (Addendum)
[place] [facility]) Care Management  08/29/2017  Jeanita Carneiro Zillmer Nov 12, 1940 [id number]   EMMI red alert  5/31 ? New swelling? Yes, worsening sob? Yes 6/1 ? Worsening sob, Yes   Placed call to patient , HIPAA verified . Patient reports that she is doing really well, states her feet can finally touch the floor, Patient reports decreased in swelling in legs and feet. Patient states she does have some shortness of breath but it has not increased it is her usual.  Patient states her usual weight is 179 lbs at Nephrologist office so she is only 1 pound above that.  Patient discussed her recent visit to Dr.Patel, Nephrologist and medication changes of stopping Lorsartan and adding metolazone and continuing bumex  2 tablet twice daily.  Patient states she is keeping a record of her urine output, weights, and blood pressures. Patient discusses fluid limit of 1.5 liters . Patient reports her husband last in and out her on Saturday and obtained 700 ml, and she has been voiding her usual since then.  Vitals:   08/29/17 1328  Weight: 180 lb (81.6 kg)    Patient states he has contacted PCP regarding possible UTI and she is now taking antibiotics and pyridium and feeling better.   Patient discussed upcoming appointments with cardiology and neurology on this week.  Patient is agreeable [facility] care manager initial home visit on this week   Plan Will schedule initial home visit on this week. '   [name], RN, [place] Management Coordinator  [phone]- Mobile [phone]- [place]

## 2017-08-30 ENCOUNTER — Ambulatory Visit: Payer: Self-pay | Admitting: *Deleted

## 2017-08-30 ENCOUNTER — Other Ambulatory Visit: Payer: Self-pay | Admitting: *Deleted

## 2017-08-30 ENCOUNTER — Telehealth: Payer: Self-pay | Admitting: Cardiology

## 2017-08-30 NOTE — Patient Outreach (Addendum)
[facility]) Care Management   08/30/2017  [name] [birth year] [id number]  [name] is an 77 y.o. female    5/20 Received referral from telephonic care management  Patient with hospital admission 5/6-5/8 Dx: heart failure . PMH: includes but not limited to CKD, Diabetes, HTN, Heart Failure, breast cancer.    Initial transition of care home visit .   Subjective:  Patient discussed continued problem with leg swelling, reports decreased swelling in the morning and as the day goes on swelling increases.  Patient reports increased swelling in legs slows her down with mobility,discussed she has to take her time getting up.  Patient discussed her weight is down to 181 lbs  but concerned since she completed prescribed days of Bumex twice daily and metolazone her weight may increase. She voiced looking forward to cardiology office visit on this week for further update on heart failure management related to medications or other recommendations .  Patient discussed that she is currently on antibiotic, Cipro and pyridium as prescribed by PCP, she is concerned with recurrent UTI over the last 3 months.     Objective:  BP 112/70 (BP Location: Right Arm, Patient Position: Sitting, Cuff Size: Normal)   Pulse 77   Resp 18   Ht 1.689 m (5' 6.5")   Wt 181 lb (82.1 kg)   SpO2 94%   BMI 28.78 kg/m    Review of Systems  Constitutional: Negative.   HENT: Negative.   Eyes: Negative.   Respiratory: Negative.   Cardiovascular: Positive for leg swelling.       Right leg greater than left, more swelling in ankle area   Gastrointestinal: Negative.   Genitourinary:       PRN In and out catheterization   Musculoskeletal: Negative.   Skin: Negative.   Endo/Heme/Allergies: Bruises/bleeds easily.  Psychiatric/Behavioral: Negative.     Physical Exam  Constitutional: She is oriented to person, place, and time. She appears well-developed and well-nourished.  Cardiovascular: Normal  rate, normal heart sounds and intact distal pulses.  Respiratory: Effort normal and breath sounds normal.  GI: Soft. Bowel sounds are normal.  Neurological: She is alert and oriented to person, place, and time.  Skin: Skin is warm and dry.  Psychiatric: She has a normal mood and affect. Her behavior is normal. Judgment and thought content normal.    Encounter Medications:   Outpatient Encounter Medications as of 08/30/2017  Medication Sig  . allopurinol (ZYLOPRIM) 100 MG tablet Take 1 tablet (100 mg total) by mouth daily.  . bumetanide (BUMEX) 2 MG tablet Take 2 mg by mouth daily. Patient currently taking double dose ( 4 mg daily )  . Calcium Carb-Cholecalciferol (CALCIUM 600 + D PO) Take 1 tablet by mouth at bedtime.   . clopidogrel (PLAVIX) 75 MG tablet Take 1 tablet (75 mg total) by mouth daily. Due for f/u appt, please call office  . CRANBERRY PO Take 1 capsule by mouth daily.  . diazepam (VALIUM) 5 MG tablet Take 5 mg by mouth daily as needed for anxiety.  Marland Kitchen exemestane (AROMASIN) 25 MG tablet Take 1 tablet (25 mg total) daily after breakfast by mouth.  . Fish Oil-Krill Oil (KRILL OIL PLUS PO) Take 1 capsule by mouth daily.   Marland Kitchen gabapentin (NEURONTIN) 300 MG capsule Take 600 mg by mouth at bedtime.   . Iron-FA-B Cmp-C-Biot-Probiotic (FUSION PLUS) CAPS takes 1 tablet daily  . metFORMIN (GLUCOPHAGE) 500 MG tablet Take by mouth 2 (two) times daily  with a meal.  . Methylfol-Methylcob-Acetylcyst (CEREFOLIN NAC) 6-2-600 MG TABS TAKE 1 CAPLET BY MOUTH ONCE DAILY.  . metoprolol succinate (TOPROL XL) 50 MG 24 hr tablet TAKE ONE-HALF (1/2) TABLET DAILY  . mirtazapine (REMERON) 15 MG tablet Take 15 mg by mouth at bedtime. 11/2 tablet at bedtime  . pramipexole (MIRAPEX) 0.25 MG tablet TAKE 1 TABLET AT 4:00 P.M. AND 2 TABLETS (0.5 MG) AT BEDTIME  . rosuvastatin (CRESTOR) 20 MG tablet Take 20 mg by mouth at bedtime.  Marland Kitchen Ubiquinol (QUNOL COQ10/UBIQUINOL/MEGA) 100 MG CAPS Take 1 tablet by mouth daily.  CoQ10, Qunol  . Biotin w/ Vitamins C & E (HAIR SKIN & NAILS GUMMIES PO) Take 2 tablets by mouth daily.   . Cholecalciferol 2000 units CAPS Take 2,000 Units by mouth daily.  . COD LIVER OIL PO Take 415 mg by mouth daily.  . diphenhydrAMINE (BENADRYL) 50 MG capsule Take 50 mg by mouth at bedtime as needed for sleep.   Marland Kitchen losartan (COZAAR) 100 MG tablet take 1 tablet daily  . Melatonin 5 MG CAPS Take 5 mg by mouth at bedtime as needed (sleep).   . pantoprazole (PROTONIX) 40 MG tablet TAKE 1 TABLET DAILY (CONTACT OFFICE FOR ADDITIONAL REFILLS, FINAL ATTEMPT) (Patient not taking: Reported on 08/30/2017)  . Resveratrol (RESERVAPAK PO) Take 1 tablet by mouth every evening.    No facility-administered encounter medications on file as of 08/30/2017.     Functional Status:   In your present state of health, do you have any difficulty performing the following activities: 08/16/2017 08/02/2017  Hearing? N N  Vision? N N  Difficulty concentrating or making decisions? N N  Walking or climbing stairs? Y Y  Dressing or bathing? N N  Doing errands, shopping? N N  Comment husband helps -  Conservation officer, nature and eating ? N -  Using the Toilet? N -  In the past six months, have you accidently leaked urine? N -  Do you have problems with loss of bowel control? N -  Managing your Medications? Y -  Comment husband helps  -  Managing your Finances? N -  Housekeeping or managing your Housekeeping? N -  Some recent data might be hidden    Fall/Depression Screening:    Fall Risk  08/23/2017 08/15/2017 03/21/2017  Falls in the past year? Yes Yes No  Number falls in past yr: 1 1 -  Injury with Fall? No - -  Risk for fall due to : Impaired balance/gait;History of fall(s) History of fall(s);Impaired balance/gait;Impaired mobility -  Follow up Falls evaluation completed Falls evaluation completed -   PHQ 2/9 Scores 08/23/2017 03/21/2017  PHQ - 2 Score 1 1    Assessment:  Initial home visit   CHF- continued concern  with lower legs swelling, patient not consistently wearing support hose . Patient is adhering to fluid restriction, and not cooking or adding salt to food , reinforced take out food, like the [place] food she had last night has increased sodium.  Patient urine output over 2 liters daily for the last 3 days,husband in and out cath's patient prn usually about once weekly. Husband keeping a written record of weights, output , blood pressures.  Will benefit from continued education and support of Heart failure education.  Fall risk - high , fall safety prevention reviewed, changing positions slowly.    Plan:  Reviewed welcome packet  Reinforced action plan on increased weight gain of 2 pounds in a day or 5 in a  week.  Will continue weekly transition of care call,  Will send PCP visit note.   [facility] CM Care Plan Problem One     Most Recent Value  Care Plan Problem One  Recent hospital admission related to Heart failure   Role Documenting the Problem One  Care Management [place] for Problem One  Active  [facility] Long Term Goal   Patient will not experience a hospital admission in the next 90 days   [facility] Long Term Goal Start Date  08/16/17  Interventions for Problem One Long Term Goal  Home visit completed, Reinforced action plan to notify MD of weight gain of 2 to 3 pounds in a day, increased swelling,/shortness of breath. Reviewed daily fluid restriction , and sodiium limit with review of label.    [facility] CM Short Term Goal #1   Over the next 30 days patient with continue to weigh daily and keep a record   [facility] CM Short Term Goal #1 Start Date  08/17/17  Interventions for Short Term Goal #1  Reviewed daily weight record, praised patient and husband with excellent documentation.   [facility] CM Short Term Goal #2   Over the next 30 days patient will be able to report at 3 symptoms of heart failure and action plan zone   [facility] CM Short Term Goal #2 Start Date  08/17/17  Interventions for Short Term Goal  #2  Provided Living with heart failure handout, and reviewed heart failure zones.   [facility] CM Short Term Goal #3  Over the next 30 days patient will be able to report decreased swelling in legs   [facility] CM Short Term Goal #3 Start Date  08/30/17  Interventions for Short Tern Goal #3  Discussed keeping feet elevated while sitting during the day, discussed elevating legs above level of heart and rationale , encouraged wearing support hose to help with controlling swelling .         [name], RN, [place] Management Coordinator  [phone]- Mobile [phone]- Toll Free Main Office

## 2017-08-30 NOTE — Telephone Encounter (Signed)
Received records from [facility] on 08/30/17, Appt 09/01/17 @ 10:20AM. [place]

## 2017-08-31 NOTE — Progress Notes (Signed)
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  Update 12/06/2012 (PS): [name] returns for f/u after last vist 06/07/2012. She has stopped cerefolin nac as insurance is not paying for it. Memory loss seems stable but she feels subjectively she was doing better on Cerefolin nac. She recently fell of a tractor in her yard and hurt her foot but is improving. She continues to have poor recent memory but is able to recall later. She decided not to do expedition 3 study upon advise from [name] her cardiologist.   Update 01/10/2014 ; she returns for followup after last visit 6 months ago who. She is scheduled in about the same. She continues to have mild short-term memory difficulties unchanged. She has been taking several daily and finds it quite useful. She saw primary physician Dr. Gerarda Fraction who suggested she stop the Depakote. 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 She used to also have some light sensitivity and possibly migrainous phenomena which also seemed to be controlled on Depakote. She underwent right shoulder surgery 2 weeks ago which went well and she has no complaints. She was hospitalized briefly twice for syncopal episodes and she thought to be vasovagal related to a diarrhea episode and dehydration. Update 07/12/2014 : She returns for follow-up after last visit 6 months ago. He is accompanied by her husband . She states some memory difficulties unchanged. She reads a lot particularly on religion. She has a difficult time remembering the day and the date but has not used good compensatory strategies for this. She remains on Cerefolin which she likes. She has noted increasing symptoms of restless  legs and at times has to take 2 tablets on Mirapex which work well. She remains on Depakote as she feels it helps her headaches and vision difficulties. She states her blood pressure is normally quite high but she has some whitecoat hypertension blood pressure is elevated today at 140/100. UPDATE 04/27/2017CM Kristen. [name], 77 year old female returns for six-month follow-up. 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 She reports that she has good and bad days when she is not feeling her best she stays at home and keeps to herself. She continues to cook and drive and perform all activities of daily living. She reports that she has not gotten lost in familiar places. Her most recent hemoglobin A1c was 5.2 No further syncopal episodes since stopping Depakote . She remains on Cerefolin which she feels is beneficial for her neuropathy and her memory. She is also on Mirapex for restless legs.  She returns for reevaluation  UPDATE 01/22/16 CM Kristen. [name], 77 year old female returns for follow-up she has a history of mild cognitive impairment and memory score is stable today at 30/30. She remains on Cerefolin. She continues to cook and perform activities of daily living. She claims her blood sugars are in good control.She has restless leg syndrome which has gotten mildly worse in the left leg worse since she now has a stress fracture to her left foot and is in a boot. She had a fall at home. She has not had further syncopal episodes since stopping Depakote last year. She returns for reevaluation Update 11/23/2016 : She returns for follow-up after last visit 10 months  ago. She states her memory difficulties remain about unchanged however she's had an episode of shingles in February this year followed by severe postherpetic neuralgic pain which still persists. She has daily almost constant pain and she has sensitivity to touch beneath her right shoulder and upper back. She has been taking gabapentin at  night and during the day she takes oxycodone which does take the edge off but makes her sleepy and drowsy. She has not tried other medication like Topamax and Lyrica. She continues to take senna fallen and does participate in cognitively challenging activities and feels some memory difficulties are unchanged. On the Mini-Mental status exam today she scored 26/30 with deficits in orientation and attention. She was able to name 11 animals with 4 legs. Clock drawing was 4/4. Update 03/03/2017 : She returns for follow-up after last visit 3 months ago. She still has significant postherpetic neuralgic pain. She feels Topamax seems to have helped but she is only on 25 mg twice daily which is tolerating well without side effects. She continues to take gabapentin 300 and the morning and 6 and at night as well. She did tried Lyrica in the past which was helping but she developed significant leg swelling and had to discontinue it. She has subsequently had bilateral mastectomy for breast cancer on 9/18 518 and has finished 2 weeks out of a 4 week course of radiation treatment. She states her blood pressure is well controlled and today it is 141/91. She's been watching her diet and her sugars are well controlled on last hemoglobin A1c was 5.4. She states her mild memory difficulties appeared to be unchanged. She does participate in regular activities for cognitively challenging tasks like solving crossword puzzles sudoku. She also takes CerefolinNAC UPDATE 6/6/2019CM Kristen. [name], 77 year old female returns for follow-up with continued significant postherpetic neuralgia pain.  Her Topamax was increased at her last visit but it was not been beneficial so she stopped the medication.  She remains on gabapentin 600 mg at night.  She is failed Lyrica in the past which caused significant leg swelling.  She had an admission 2 weeks for congestive  heart failure.  She is due to follow-up with her cardiologist today.  Blood pressure in the  office today well controlled at 122/80.  CBG this morning 96, most recent hemoglobin A1c 5.4.  Her memory difficulties are unchanged and her MMSE is stable.  She continues to participate in activities for cognitively challenging task. ADLs 6/6. IADLs 7/8.  She returns for reevaluation  REVIEW OF SYSTEMS: Full 14 system review of systems performed and notable only for those listed, all others are neg:  Constitutional: neg  Cardiovascular: neg Ear/Nose/Throat: neg  Skin: neg Eyes: neg Respiratory: neg Gastroitestinal: neg  Hematology/Lymphatic: neg  Endocrine: neg Musculoskeletal:neg Allergy/Immunology: neg Neurological: neg Psychiatric: neg Sleep : neg   ALLERGIES: Allergies  Allergen Reactions  . Lipitor [Atorvastatin] Other (See Comments)    Bone and muscle pain  . Lyrica [Pregabalin]     Weight gain, extremity swelling  . Nsaids Swelling  . Reglan [Metoclopramide]     Tardive dyskinesia; paradoxical reaction not relieved by benadryl  . Ultram [Tramadol] Other (See Comments)    Pt has seizure activity with this medication    HOME MEDICATIONS: Outpatient Medications Prior to Visit  Medication Sig Dispense Refill  . allopurinol (ZYLOPRIM) 100 MG tablet Take 1 tablet (100 mg total) by mouth daily. 30 tablet 6  . Biotin w/ Vitamins C & E (HAIR SKIN &  NAILS GUMMIES PO) Take 2 tablets by mouth daily.     . bumetanide (BUMEX) 2 MG tablet Take 2 mg by mouth daily. Patient currently taking double dose ( 4 mg daily )    . Calcium Carb-Cholecalciferol (CALCIUM 600 + D PO) Take 1 tablet by mouth at bedtime.     . Cholecalciferol 2000 units CAPS Take 2,000 Units by mouth daily.    . ciprofloxacin (CIPRO) 500 MG tablet completes today 09/01/17  0  . clopidogrel (PLAVIX) 75 MG tablet Take 1 tablet (75 mg total) by mouth daily. Due for f/u appt, please call office 90 tablet 0  . COD LIVER OIL PO Take 415 mg by mouth daily.    Marland Kitchen CRANBERRY PO Take 1 capsule by mouth daily.    . diazepam  (VALIUM) 5 MG tablet Take 5 mg by mouth daily as needed for anxiety.    . diphenhydrAMINE (BENADRYL) 50 MG capsule Take 50 mg by mouth at bedtime as needed for sleep.     Marland Kitchen exemestane (AROMASIN) 25 MG tablet Take 1 tablet (25 mg total) daily after breakfast by mouth. 90 tablet 3  . Fish Oil-Krill Oil (KRILL OIL PLUS PO) Take 1 capsule by mouth daily.     Marland Kitchen gabapentin (NEURONTIN) 300 MG capsule Take 600 mg by mouth at bedtime.     . Iron-FA-B Cmp-C-Biot-Probiotic (FUSION PLUS) CAPS takes 1 tablet daily  9  . Melatonin 5 MG CAPS Take 5 mg by mouth at bedtime as needed (sleep).     . metFORMIN (GLUCOPHAGE) 500 MG tablet Take by mouth 2 (two) times daily with a meal.    . Methylfol-Methylcob-Acetylcyst (CEREFOLIN NAC) 6-2-600 MG TABS TAKE 1 CAPLET BY MOUTH ONCE DAILY. 90 each 3  . metoprolol succinate (TOPROL XL) 50 MG 24 hr tablet TAKE ONE-HALF (1/2) TABLET DAILY 45 tablet 0  . mirtazapine (REMERON) 15 MG tablet Take 15 mg by mouth at bedtime. 11/2 tablet at bedtime    . pantoprazole (PROTONIX) 40 MG tablet TAKE 1 TABLET DAILY (CONTACT OFFICE FOR ADDITIONAL REFILLS, FINAL ATTEMPT) 90 tablet 0  . pramipexole (MIRAPEX) 0.25 MG tablet TAKE 1 TABLET AT 4:00 P.M. AND 2 TABLETS (0.5 MG) AT BEDTIME 270 tablet 1  . Resveratrol (RESERVAPAK PO) Take 1 tablet by mouth every evening.     . rosuvastatin (CRESTOR) 20 MG tablet Take 20 mg by mouth at bedtime.    Marland Kitchen Ubiquinol (QUNOL COQ10/UBIQUINOL/MEGA) 100 MG CAPS Take 1 tablet by mouth daily. CoQ10, Qunol    . losartan (COZAAR) 100 MG tablet take 1 tablet daily  3   No facility-administered medications prior to visit.     PAST MEDICAL HISTORY: Past Medical History:  Diagnosis Date  . Anxiety   . Arthritis     osteoarthritis  . Breast cancer in female [facility])    Bilateral  . CAD S/P percutaneous coronary angioplasty 2007; March 2011   [facility] 3.0 mm 20 mm postdilated 3.6 mm in early mid LAD; status post ISR Cutting Balloon PTCA and March 11 along with  PCI of distal mid lesion with a 3.0 mm 12 mm MultiLink vision BMS; the proximal stent causes jailing of SP1 and SP2 with ostial 70-80% lesions  . Cancer (HCC)    Melanoma, Squamous cell Carcinoma  . CHF (congestive heart failure) ([place])   . Chronic kidney disease    stage 2   . Dementia    mild  . Diabetes mellitus type 2 with neurological manifestations ([employer])   .  Dyslipidemia, goal LDL below 70   . Full dentures   . Genetic testing 12/03/2016   Germline genetic testing was performed through [facility] Common Hereditary Cancers Panel + [facility]'s Melanoma Panel. This custom panel includes analysis of the following 51 genes: APC, ATM, AXIN2, BAP1, BARD1, BMPR1A, BRCA1, BRCA2, BRIP1, CDH1, CDK4, CDKN2A, CHEK2, CTNNA1, DICER1, EPCAM, GREM1, HOXB13, KIT, MEN1, MITF, MLH1, MSH2, MSH3, MSH6, MUTYH, NBN, NF1, NTHL1, PALB2, PDGFRA, PMS2, POLD1, POL  . GERD (gastroesophageal reflux disease)   . Headache(784.0)   . History of hematuria    Followed by Dr. Gaynelle Arabian  . Hypertension, essential, benign   . Nausea & vomiting 10/2015  . Peripheral neuropathy   . Stroke ([place])   . TIA (transient ischemic attack)    multiple in the past  . Wears glasses     PAST SURGICAL HISTORY: Past Surgical History:  Procedure Laterality Date  . ABDOMINAL HYSTERECTOMY    . APPENDECTOMY    . BLADDER SUSPENSION    . BREAST LUMPECTOMY WITH RADIOACTIVE SEED AND SENTINEL LYMPH NODE BIOPSY Bilateral 12/14/2016   Procedure: BILATERAL BREAST LUMPECTOMY WITH BILATERAL  RADIOACTIVE SEED AND BILATERAL AXILLARY  SENTINEL LYMPH NODE BIOPSY ERAS PATHWAY;  Surgeon: Alphonsa Overall, MD;  Location: [facility];  Service: General;  Laterality: Bilateral;  PECTORAL BLOCK  . CARDIAC CATHETERIZATION  02/24/2006   85% stenosis in the proximal portion of LAD-arrangements made for PCI on 02/25/2006  . CARDIAC CATHETERIZATION  02/25/2006   80% LAD lesion stented with a 3x24m [facility]te stent resulting in reduction of 80% lesion to 0% residual  .  CARDIAC CATHETERIZATION  04/26/2006   Medical management  . CARDIAC CATHETERIZATION  06/24/2009   60-70% re-stenosis in the proximal LAD. A 3.25x15 cutting balloon, 3 inflations - 14atm-38sec, 13atm-39sec, and 12atm-40sec reduced to less than 10%. 60% stenosis of the mid/distal LAD stented with a 3x129mMultilink stent.  . Marland KitchenARDIAC CATHETERIZATION  07/09/2009   Medical management  . CAROTID DOPPLER  06/24/2009   40-59% R ICA stenosis. No significant ICA stenosis noted  . CHOLECYSTECTOMY    . DILATION AND CURETTAGE OF UTERUS    . JOINT REPLACEMENT Left   . LESION REMOVAL Right 03/11/2015   Procedure:  EXCISION OF RIGHT PRE TIBIAL LESION;  Surgeon: JaJudeth HornMD;  [place] [facility] Service: General;  Laterality: Right;  . MASS EXCISION Left 06/10/2014   Procedure: EXCISION LEFT LOWER LEG LESION;  Surgeon: JaDoreen SalvageMD;  [place] Service: General;  Laterality: Left;  . Marland KitchenASS EXCISION Left 09/05/2015   Procedure: EXCISION OF LEFT FOREARM  MASS;  Surgeon: JaJudeth HornMD;  [place] Service: General;  Laterality: Left;  . NM MYOVIEW LTD  11/16/2011   5 beats a PVC during recovery.  No skin or infarction.  Reached 5 METs, EKG negative for ischemia   . SHOULDER ARTHROSCOPY  10/15  . SHOULDER ARTHROSCOPY  1995   left  . TEE WITHOUT CARDIOVERSION N/A 08/03/2017   Procedure: TRANSESOPHAGEAL ECHOCARDIOGRAM (TEE);  Surgeon: RaSkeet LatchMD;  [place] [facility] Service: Cardiovascular;  Laterality: N/A;  . TENDON REPAIR Left 05/12/2016   Procedure: Left Anterior Tibial Tendon Reconstruction;  Surgeon: MaNewt MinionMD;  [place] [facility] Service: Orthopedics;  Laterality: Left;  . TOTAL KNEE ARTHROPLASTY  2005   left  . TRANSTHORACIC ECHOCARDIOGRAM  10/2015   EF 55-60 %. Mild LVH. Mild pulmonary hypertension. Normal valves.  . TRANSTHORACIC ECHOCARDIOGRAM  12/31/2009/July 2015   IsLV size & function, Gr 1 DD w/ mild  TR.;; b)09/2013: 55-60%. Grade 1 DD, mild aortic sclerosis  . WRIST ARTHROPLASTY  2010   cancer lt wrist    FAMILY HISTORY: Family History  Problem Relation Age of Onset  . Diabetes Mother   . Hypertension Mother   . Kidney disease Mother   . Hypertension Father   . Emphysema Father   . Heart attack Father   . Heart disease Father   . Arthritis Sister   . Diabetes Sister   . Hypertension Sister   . Breast cancer Sister 37       treated with mastectomy  . Cervical cancer Sister 57  . Hypertension Brother   . Hyperlipidemia Brother   . Diabetes Brother   . Stroke Brother   . Diabetes Sister   . Hypertension Sister   . Stomach cancer Sister 81  . Hypertension Brother   . ALS Brother        d.62s  . Breast cancer Daughter 63       triple negative treated with lumpectomy, chemo, radiation  . Heart attack Daughter 35       d.45  . COPD Daughter   . Cancer Paternal Aunt        unspecified type  . Cancer Paternal Uncle        unspecified type  . Cancer Paternal Uncle        unspecified type    SOCIAL HISTORY: Social History   Socioeconomic History  . Marital status: Married    Spouse name: [name]  . Number of children: 3  . Years of education: 76  . Highest education level: Not on file  Occupational History  . Occupation: retired    Fish farm manager: RETIRED  Social Needs  . Financial resource strain: Not on file  . Food insecurity:    Worry: Not on file    Inability: Not on file  . Transportation needs:    Medical: Not on file    Non-medical: Not on file  Tobacco Use  . Smoking status: Never Smoker  . Smokeless tobacco: Never Used  Substance and Sexual Activity  . Alcohol use: No  . Drug use: No  . Sexual activity: Never  Lifestyle  . Physical activity:    Days per week: Not on file    Minutes per session: Not on file  . Stress: Not on file  Relationships  . Social connections:    Talks on phone: Not on file    Gets together: Not on file    Attends  religious service: Not on file    Active member of club or organization: Not on file    Attends meetings of clubs or organizations: Not on file    Relationship status: Not on file  . Intimate partner violence:    Fear of current or ex partner: Not on file    Emotionally abused: Not on file    Physically abused: Not on file    Forced sexual activity: Not on file  Other Topics Concern  . Not on file  Social History Narrative   She is a married mother of 74, grandmother of 2.  Usually very active and out about the house.  But currently over last 6-8 weeks has been incapacitated.  She never smoked and does not drink alcohol.   10 th grade     PHYSICAL EXAM  Vitals:   09/01/17 0809  BP: 122/80  Pulse: 85  Weight: 183 lb 6.4 oz (83.2 kg)  Height: _0  (1.676 m)   Body mass index is 29.6 kg/m.  Generalized: Well developed, obese female in no acute distress  Head: normocephalic and atraumatic,. Oropharynx benign  Neck: Supple,  Musculoskeletal: No deformity Skin pedal edema   Neurological examination   Mentation: Alert MMSE 27/30 Last 26/30. AFT 15 Clock drawing 4/4   Follows all commands speech and language fluent.   Cranial nerve II-XII: Pupils were equal round reactive to light extraocular movements were full, visual field were full on confrontational test. Facial sensation and strength were normal. hearing was intact to finger rubbing bilaterally. Uvula tongue midline. head turning and shoulder shrug were normal and symmetric.Tongue protrusion into cheek strength was normal. Motor: normal bulk and tone, full strength in the BUE, BLE, mild head tremor noted Sensory: Decreased pinprick to mid shin decreased vibratory to toes   Coordination: finger-nose-finger, heel-to-shin bilaterally, no dysmetria Reflexes: 1+ upper lower and symmetric plantar responses were flexor bilaterally. Gait and Station: Rising up from seated position without assistance, stooped posture ambulated a  short distance in the hall unable to heel toe or tandem no assistive device   DIAGNOSTIC DATA (LABS, IMAGING, TESTING) - I reviewed patient records, labs, notes, testing and imaging myself where available.  Lab Results  Component Value Date   WBC 6.9 08/15/2017   HGB 11.9 08/15/2017   HCT 35.8 08/15/2017   MCV 92.1 08/15/2017   PLT 211 08/15/2017      Component Value Date/Time   NA 142 08/15/2017 1037   NA 140 08/01/2017 1143   K 4.0 08/15/2017 1037   CL 105 08/15/2017 1037   CO2 26 08/15/2017 1037   GLUCOSE 92 08/15/2017 1037   BUN 57 (H) 08/15/2017 1037   BUN 25 08/01/2017 1143   CREATININE 2.22 (H) 08/15/2017 1037   CREATININE 0.94 01/09/2013 0839   CALCIUM 10.0 08/15/2017 1037   PROT 7.1 08/15/2017 1037   PROT 7.1 08/01/2017 1143   ALBUMIN 3.8 08/15/2017 1037   ALBUMIN 4.7 08/01/2017 1143   AST 22 08/15/2017 1037   ALT 19 08/15/2017 1037   ALKPHOS 92 08/15/2017 1037   BILITOT 0.3 08/15/2017 1037   GFRNONAA 20 (L) 08/15/2017 1037   GFRAA 23 (L) 08/15/2017 1037   Lab Results  Component Value Date   CHOL  12/31/2009    148        ATP III CLASSIFICATION:  <200     mg/dL   Desirable  200-239  mg/dL   Borderline High  >=240    mg/dL   High          HDL 74 12/31/2009   LDLCALC  12/31/2009    47        Total Cholesterol/HDL:CHD Risk Coronary Heart Disease Risk Table                     Men   Women  1/2 Average Risk   3.4   3.3  Average Risk       5.0   4.4  2 X Average Risk   9.6   7.1  3 X Average Risk  23.4   11.0        Use the calculated Patient Ratio above and the CHD Risk Table to determine the patient's CHD Risk.        ATP III CLASSIFICATION (LDL):  <100     mg/dL   Optimal  100-129  mg/dL   Near or Above                    Optimal  130-159  mg/dL   Borderline  160-189  mg/dL   High  >190     mg/dL   Very High   TRIG 134 12/31/2009   CHOLHDL 2.0 12/31/2009   Lab Results  Component Value Date   HGBA1C 5.7 (H) 12/07/2016   Lab Results    Component Value Date   [id number] 817 09/26/2009   Lab Results  Component Value Date   TSH 1.920 10/05/2013      ASSESSMENT AND PLAN    77y.o. 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 Likely restless leg syndrome, Peripheral neuropathy (HCC) Chronic kidney disease; Headache(784.0); gait abnormality . Postherpetic neuralgia following shingles in February 2018.  Admission to the hospital 2 weeks ago for congestive heart failure   PLAN: Increase Gabapentin to 3 times daily if needed Continue cerefolin Patient failed lyrica and Topamax Continue to participate in memory stimulation activities Memory score is stable Follow up 6 to 8 months   Dennie Bible, [facility], [facility], [facility] [address], [place], [place] [address] [phone]

## 2017-09-01 ENCOUNTER — Encounter: Payer: Self-pay | Admitting: Nurse Practitioner

## 2017-09-01 ENCOUNTER — Ambulatory Visit (INDEPENDENT_AMBULATORY_CARE_PROVIDER_SITE_OTHER): Payer: Medicare Other | Admitting: Nurse Practitioner

## 2017-09-01 ENCOUNTER — Telehealth: Payer: Self-pay | Admitting: *Deleted

## 2017-09-01 ENCOUNTER — Other Ambulatory Visit: Payer: Self-pay | Admitting: *Deleted

## 2017-09-01 ENCOUNTER — Encounter: Payer: Self-pay | Admitting: Cardiology

## 2017-09-01 ENCOUNTER — Ambulatory Visit (INDEPENDENT_AMBULATORY_CARE_PROVIDER_SITE_OTHER): Payer: Medicare Other | Admitting: Cardiology

## 2017-09-01 VITALS — BP 140/72 | HR 82 | Ht 66.0 in | Wt 182.0 lb

## 2017-09-01 VITALS — BP 122/80 | HR 85 | Ht 66.0 in | Wt 183.4 lb

## 2017-09-01 DIAGNOSIS — G3184 Mild cognitive impairment, so stated: Secondary | ICD-10-CM

## 2017-09-01 DIAGNOSIS — I2510 Atherosclerotic heart disease of native coronary artery without angina pectoris: Secondary | ICD-10-CM

## 2017-09-01 DIAGNOSIS — R600 Localized edema: Secondary | ICD-10-CM | POA: Diagnosis not present

## 2017-09-01 DIAGNOSIS — I10 Essential (primary) hypertension: Secondary | ICD-10-CM

## 2017-09-01 DIAGNOSIS — R42 Dizziness and giddiness: Secondary | ICD-10-CM | POA: Diagnosis not present

## 2017-09-01 DIAGNOSIS — E785 Hyperlipidemia, unspecified: Secondary | ICD-10-CM

## 2017-09-01 DIAGNOSIS — I5032 Chronic diastolic (congestive) heart failure: Secondary | ICD-10-CM

## 2017-09-01 DIAGNOSIS — Z9861 Coronary angioplasty status: Secondary | ICD-10-CM

## 2017-09-01 DIAGNOSIS — B0229 Other postherpetic nervous system involvement: Secondary | ICD-10-CM | POA: Diagnosis not present

## 2017-09-01 MED ORDER — METOLAZONE 2.5 MG PO TABS: ORAL_TABLET | ORAL | 3 refills | Status: DC

## 2017-09-01 MED ORDER — GABAPENTIN 300 MG PO CAPS: 300.0000 mg | ORAL_CAPSULE | Freq: Two times a day (BID) | ORAL | 1 refills | Status: DC

## 2017-09-01 NOTE — Patient Instructions (Signed)
Increase Gabapentin to 3 times daily if needed Patient failed lyrica and Topamax Continue to participate in memory stimulation activities Memory score is stable Follow up 6 to 8 months

## 2017-09-01 NOTE — Patient Outreach (Signed)
[facility]) Care Management  09/01/2017  Iysha Mishkin Follett [birth year] [id number]   EMMI red alert flag,  New swelling: yes New or worsening symptoms : yes New swelling : yes  Successful outreach call to patient , to discuss red flag alert call .  Patient discussed no new worsening of symptoms , swelling down, and she is no worse.  Patient discussed some difficulty with the automated phone call at times.   Patient discussed her office visits with neurology, and received a good report memory stable.  Patient discussed Cardiology visit and new orders for Metolazone, and Bumex instructions as well as wearing support hose. Patient discussed new goal weight of 178.   Plan  Will plan follow up call in the next week as part of transition of care, patient will continue monitoring weights, output .   [name], RN, [place] Management Coordinator  [phone]- Mobile [phone]- Toll Free Main Office

## 2017-09-01 NOTE — Patient Instructions (Addendum)
Medication instructions  Dry weight should be 178 lbs  Take SECOND DOSE  OF BUMEX  AT  2 PM--DAILY   continue Bumex sliding scale Zaroxolyn 2.5 mg  30 min  before morning Bumex  mon- wed- fri. UNTIL WEIGHT  RETURNS TO 178 LBS  THEN  TAKE ZAROXOLYN 2.5 MG 30 MIN BEFORE MORNING DOSE OF BUMEX.      Your physician wants you to follow-up in 4 months with DR HARDING. You will receive a reminder letter in the mail two months in advance. If you don't receive a letter, please call our office to schedule the follow-up appointment.   If you need a refill on your cardiac medications before your next appointment, please call your pharmacy.

## 2017-09-01 NOTE — Progress Notes (Signed)
PCP: Redmond School, MD  Clinic Note: Chief Complaint  Patient presents with  . Follow-up    14 months / hospital f/u  . Headache  . Shortness of Breath  . Edema    HPI: [name] is a 77 y.o. female with a PMH below who presents today for a mass.  Delayed annual follow-up also hospital follow-up with history of CAD-PCI, chronic diastolic heart failure with ankles with swelling. --Wears compression stockings.  [name] just stopped her losartan b/c low BP.   CAD history: History of BMS PCI to the LAD in 2006 --had post dilation PTCA for ISR in 2011 -otherwise stable since then.  Nonischemic Myoview in August 2013 (did have a short burst of 5 beats NSVT) read as LOW RISK. --No ischemia or infarction.  She has been also dealing with right breast cancer.[name] was last seen on July 20, 2016 --this was a delayed follow-up as well.  She was doing fine from a cardiac standpoint.  No anginal symptoms.  No heart failure symptoms.  A few palpitations but nothing significant.  Recent Hospitalizations:   May 6-8, 2019 -reportedly acute diastolic heart failure --manifested as worsening lower extremity edema and exertional dyspnea -->failed outpatient Bumex/Lasix.  Treated with IV Lasix.  Diuresed 5 L.  Echo relatively normal but had a TEE done to evaluate a possible cardiac mass.  Stable chronic kidney disease.  Studies Personally Reviewed - (if available, images/films reviewed: From Epic Chart or Care Everywhere)  TRANSTHORACIC ECHO Aug 02, 2017: Vigorous LV function.  EF 65 and 70%.  Mild diastolic dysfunction no regional wall motion normality.  Questionable right atrial mass.GR 1 DD.  Mild aortic sclerosis/no stenosis  TRANSESOPHAGEAL ECHO Aug 03, 2017: EF 60-65% no wall motion normalities.  No atrial thrombus.  Lipomatous hypertrophy of the atrial septum.  No mass noted.  Interval History: [name] returns today overall doing fairly well.  Her blood pressures been running a little  bit low and her renal function was little bit off so her ARB was discontinued.  She still notes having some lower extremity swelling, and is not fully back to what had usually been her dry weight which is closer to 175 pound range. She has been reluctant to wear support stockings, because of discomfort and difficulty putting them on.  Despite having edema, she is not having any PND orthopnea.  She is able to walk  around without any significant dyspnea.  She has some balance issues and that he is occasionally dizzy.  Denies any chest tightness or pressure with rest or exertion. She always has some intermittent palpitations, but no rapid irregular heartbeats.  Her husband keeps track of her urine output etc., as she is currently requiring intermittent self-catheterization (that he usually does). --At her last nephrology visit she was started on metolazone (with the intention of this being a daily dose)-as well as Bumex 2 mg tabs 3 tabs daily.  Apparently, there is no refill done for metolazone, and she was not sure whether she should continue taking it..  This was in conjunction with stopping her losartan.  Since stopping the losartan, she does note that her dizziness is improved and her energy level is improved. No syncope or near syncope, TIA or amaurosis fugax.  No claudication.  No melena, hematochezia, hematuria, or epstaxis.  ROS: A comprehensive was performed. Review of Systems  Constitutional: Positive for malaise/fatigue (Gradually building back her strength from last hospital stay). Negative for chills  and fever.  HENT: Negative for congestion and nosebleeds.   Respiratory: Positive for shortness of breath (No more than baseline). Negative for cough.   Gastrointestinal: Negative for abdominal pain and heartburn.  Genitourinary: Negative for dysuria and hematuria.       Still has to do in and out caths  Musculoskeletal: Positive for joint pain.  Neurological: Positive for dizziness (Rare  now) and headaches.  Psychiatric/Behavioral: Positive for memory loss. The patient is nervous/anxious.   All other systems reviewed and are negative.    I have reviewed and (if needed) personally updated the patient's problem list, medications, allergies, past medical and surgical history, social and family history.   Past Medical History:  Diagnosis Date  . Anxiety   . Arthritis     osteoarthritis  . Breast cancer in female [facility])    Bilateral  . CAD S/P percutaneous coronary angioplasty 2007; March 2011   [employer] 3.0 mm 20 mm postdilated 3.6 mm in early mid LAD; status post ISR Cutting Balloon PTCA and March 11 along with PCI of distal mid lesion with a 3.0 mm 12 mm MultiLink vision BMS; the proximal stent causes jailing of SP1 and SP2 with ostial 70-80% lesions  . Cancer (HCC)    Melanoma, Squamous cell Carcinoma  . CHF (congestive heart failure) ([place])   . Chronic kidney disease    stage 2   . Dementia    mild  . Diabetes mellitus type 2 with neurological manifestations ([place])   . Dyslipidemia, goal LDL below 70   . Full dentures   . Genetic testing 12/03/2016   Germline genetic testing was performed through [employer] Common Hereditary Cancers Panel + [facility] Melanoma Panel. This custom panel includes analysis of the following 51 genes: APC, ATM, AXIN2, BAP1, BARD1, BMPR1A, BRCA1, BRCA2, BRIP1, CDH1, CDK4, CDKN2A, CHEK2, CTNNA1, DICER1, EPCAM, GREM1, HOXB13, KIT, MEN1, MITF, MLH1, MSH2, MSH3, MSH6, MUTYH, NBN, NF1, NTHL1, PALB2, PDGFRA, PMS2, POLD1, POL  . GERD (gastroesophageal reflux disease)   . Headache(784.0)   . History of hematuria    Followed by Dr. Gaynelle Arabian  . Hypertension, essential, benign   . Nausea & vomiting 10/2015  . Peripheral neuropathy   . Stroke ([place])   . TIA (transient ischemic attack)    multiple in the past  . Wears glasses     Past Surgical History:  Procedure Laterality Date  . ABDOMINAL HYSTERECTOMY    . APPENDECTOMY    . BLADDER  SUSPENSION    . BREAST LUMPECTOMY WITH RADIOACTIVE SEED AND SENTINEL LYMPH NODE BIOPSY Bilateral 12/14/2016   Procedure: BILATERAL BREAST LUMPECTOMY WITH BILATERAL  RADIOACTIVE SEED AND BILATERAL AXILLARY  SENTINEL LYMPH NODE BIOPSY ERAS PATHWAY;  Surgeon: Alphonsa Overall, MD;  Location: [place];  Service: General;  Laterality: Bilateral;  PECTORAL BLOCK  . CARDIAC CATHETERIZATION  02/24/2006   85% stenosis in the proximal portion of LAD-arrangements made for PCI on 02/25/2006  . CARDIAC CATHETERIZATION  02/25/2006   80% LAD lesion stented with a 3x11m Liberte stent resulting in reduction of 80% lesion to 0% residual  . CARDIAC CATHETERIZATION  04/26/2006   Medical management  . CARDIAC CATHETERIZATION  06/24/2009   60-70% re-stenosis in the proximal LAD. A 3.25x15 cutting balloon, 3 inflations - 14atm-38sec, 13atm-39sec, and 12atm-40sec reduced to less than 10%. 60% stenosis of the mid/distal LAD stented with a 3x143mMultilink stent.  . Marland KitchenARDIAC CATHETERIZATION  07/09/2009   Medical management  . CAROTID DOPPLER  06/24/2009   40-59% R ICA  stenosis. No significant ICA stenosis noted  . CHOLECYSTECTOMY    . DILATION AND CURETTAGE OF UTERUS    . JOINT REPLACEMENT Left   . LESION REMOVAL Right 03/11/2015   Procedure:  EXCISION OF RIGHT PRE TIBIAL LESION;  Surgeon: Judeth Horn, MD;  Location: [place];  Service: General;  Laterality: Right;  . MASS EXCISION Left 06/10/2014   Procedure: EXCISION LEFT LOWER LEG LESION;  Surgeon: Doreen Salvage, MD;  Location: [place];  Service: General;  Laterality: Left;  Marland Kitchen MASS EXCISION Left 09/05/2015   Procedure: EXCISION OF LEFT FOREARM  MASS;  Surgeon: Judeth Horn, MD;  Location: [place];  Service: General;  Laterality: Left;  . NM MYOVIEW LTD  11/16/2011   5 beats a PVC during recovery.  No skin or infarction.  Reached 5 METs, EKG negative for ischemia   . NM MYOVIEW LTD  10/2011   No ischemia or infarction.  Normal EF.   LOW RISK. (Short bursts of 5 beats NSVT during recovery otherwise normal)  . SHOULDER ARTHROSCOPY  10/15  . SHOULDER ARTHROSCOPY  1995   left  . TEE WITHOUT CARDIOVERSION N/A 08/03/2017   Procedure: TRANSESOPHAGEAL ECHOCARDIOGRAM (TEE);  Surgeon: Skeet Latch, MD;  Location: [place];  Service: Cardiovascular;; EF 60-65% with no wall motion normalities.  No atrial thrombus noted.  Lightly findings consistent with a lipomatous hypertrophy of the atrial septum.   . TENDON REPAIR Left 05/12/2016   Procedure: Left Anterior Tibial Tendon Reconstruction;  Surgeon: Newt Minion, MD;  Location: [place];  Service: Orthopedics;  Laterality: Left;  . TOTAL KNEE ARTHROPLASTY  2005   left  . TRANSTHORACIC ECHOCARDIOGRAM  10/2015   EF 55-60 %. Mild LVH. Mild pulmonary hypertension. Normal valves.  . TRANSTHORACIC ECHOCARDIOGRAM  07/2017   Vigorous LV function.  EF 65-70%.  Mild diastolic dysfunction with no RWMA. GR 1 DD.  Mild aortic sclerosis/no stenosis.  Questionable right atrial mass discounted with TEE)   . WRIST ARTHROPLASTY  2010   cancer lt wrist    Current Meds  Medication Sig  . allopurinol (ZYLOPRIM) 100 MG tablet Take 1 tablet (100 mg total) by mouth daily.  . bumetanide (BUMEX) 2 MG tablet Take 2 mg by mouth daily. Patient currently taking double dose ( 4 mg daily )  . Calcium Carb-Cholecalciferol (CALCIUM 600 + D PO) Take 1 tablet by mouth at bedtime.   . Cholecalciferol 2000 units CAPS Take 2,000 Units by mouth daily.  . ciprofloxacin (CIPRO) 500 MG tablet completes today 09/01/17  . clopidogrel (PLAVIX) 75 MG tablet Take 1 tablet (75 mg total) by mouth daily. Due for f/u appt, please call office  . COD LIVER OIL PO Take 415 mg by mouth daily.  Marland Kitchen CRANBERRY PO Take 1 capsule by mouth daily.  . diazepam (VALIUM) 5 MG tablet Take 5 mg by mouth daily as needed for anxiety.  . diphenhydrAMINE (BENADRYL) 50 MG capsule Take 50 mg by mouth at bedtime as needed for sleep.   Marland Kitchen exemestane  (AROMASIN) 25 MG tablet Take 1 tablet (25 mg total) daily after breakfast by mouth.  . Fish Oil-Krill Oil (KRILL OIL PLUS PO) Take 1 capsule by mouth daily.   Marland Kitchen gabapentin (NEURONTIN) 300 MG capsule Take 1 capsule (300 mg total) by mouth 2 (two) times daily. Take 1 during the day if needed and 2 at night  . Iron-FA-B Cmp-C-Biot-Probiotic (FUSION PLUS) CAPS takes 1 tablet daily  . Melatonin 5  MG CAPS Take 5 mg by mouth at bedtime as needed (sleep).   . metFORMIN (GLUCOPHAGE) 500 MG tablet Take by mouth 2 (two) times daily with a meal.  . Methylfol-Methylcob-Acetylcyst (CEREFOLIN NAC) 6-2-600 MG TABS TAKE 1 CAPLET BY MOUTH ONCE DAILY.  . metoprolol succinate (TOPROL XL) 50 MG 24 hr tablet TAKE ONE-HALF (1/2) TABLET DAILY  . mirtazapine (REMERON) 15 MG tablet Take 15 mg by mouth at bedtime. 11/2 tablet at bedtime  . pantoprazole (PROTONIX) 40 MG tablet TAKE 1 TABLET DAILY (CONTACT OFFICE FOR ADDITIONAL REFILLS, FINAL ATTEMPT)  . pramipexole (MIRAPEX) 0.25 MG tablet TAKE 1 TABLET AT 4:00 P.M. AND 2 TABLETS (0.5 MG) AT BEDTIME  . Resveratrol (RESERVAPAK PO) Take 1 tablet by mouth every evening.   . rosuvastatin (CRESTOR) 20 MG tablet Take 20 mg by mouth at bedtime.  Marland Kitchen Ubiquinol (QUNOL COQ10/UBIQUINOL/MEGA) 100 MG CAPS Take 1 tablet by mouth daily. CoQ10, Qunol    Allergies  Allergen Reactions  . Lipitor [Atorvastatin] Other (See Comments)    Bone and muscle pain  . Lyrica [Pregabalin]     Weight gain, extremity swelling  . Nsaids Swelling  . Reglan [Metoclopramide]     Tardive dyskinesia; paradoxical reaction not relieved by benadryl  . Ultram [Tramadol] Other (See Comments)    Pt has seizure activity with this medication    Social History   Tobacco Use  . Smoking status: Never Smoker  . Smokeless tobacco: Never Used  Substance Use Topics  . Alcohol use: No  . Drug use: No   Social History   Social History Narrative   She is a married mother of 53, grandmother of 2.  Usually very  active and out about the house.  But currently over last 6-8 weeks has been incapacitated.  She never smoked and does not drink alcohol.   10 th grade  DNR=--   family history includes ALS in her brother; Arthritis in her sister; Breast cancer (age of onset: 12) in her sister; Breast cancer (age of onset: 75) in her daughter; COPD in her daughter; Cancer in her paternal aunt, paternal uncle, and paternal uncle; Cervical cancer (age of onset: 72) in her sister; Diabetes in her brother, mother, sister, and sister; Emphysema in her father; Heart attack in her father; Heart attack (age of onset: 46) in her daughter; Heart disease in her father; Hyperlipidemia in her brother; Hypertension in her brother, brother, father, mother, sister, and sister; Kidney disease in her mother; Stomach cancer (age of onset: 69) in her sister; Stroke in her brother.  Wt Readings from Last 3 Encounters:  09/01/17 182 lb (82.6 kg)  09/01/17 183 lb 6.4 oz (83.2 kg)  08/30/17 181 lb (82.1 kg)    PHYSICAL EXAM BP 140/72 (BP Location: Right Arm, Patient Position: Sitting, Cuff Size: Normal)   Pulse 82   Ht _0  (1.676 m)   Wt 182 lb (82.6 kg)   BMI 29.38 kg/m  Physical Exam  Constitutional: She is oriented to person, place, and time. She appears well-developed and well-nourished. No distress.  Borderline obese.  Otherwise healthy-appearing.  Well-groomed  HENT:  Head: Normocephalic and atraumatic.  Neck: Normal range of motion. Neck supple. No hepatojugular reflux and no JVD present. Carotid bruit is not present.  Cardiovascular: Normal rate, regular rhythm, normal heart sounds, intact distal pulses and normal pulses.  No extrasystoles are present. PMI is not displaced. Exam reveals no gallop and no friction rub.  No murmur heard. Pulmonary/Chest: Effort normal  and breath sounds normal. No respiratory distress. She has no wheezes. She has no rales. She exhibits tenderness (Costochondral tenderness).  Abdominal:  Soft. Bowel sounds are normal. She exhibits no distension. There is no tenderness. There is no rebound.  Musculoskeletal: Normal range of motion. She exhibits edema (1+ bilateral LE).  Neurological: She is alert and oriented to person, place, and time.  Still seems a little frazzled and anxious  Psychiatric: She has a normal mood and affect. Judgment and thought content normal.     Adult ECG Report  Rate: 82 ;  Rhythm: normal sinus rhythm and Cannot rule out inferior MI, age undetermined.  Also borderline criteria for anterior MI, age undetermined.  No change.; normal axis, intervals and durations  Narrative Interpretation: No real change.   Other studies Reviewed: Additional studies/ records that were reviewed today include:  Recent Labs:   From May 04, 2017: Total cholesterol 151, triglycerides 163, HDL 46, LDL 72.  BUN/creatinine 57/2.22.  TSH 3.4.  A1c 5.4. Lab Results  Component Value Date   CREATININE 2.22 (H) 08/15/2017   BUN 57 (H) 08/15/2017   NA 142 08/15/2017   K 4.0 08/15/2017   CL 105 08/15/2017   CO2 26 08/15/2017     ASSESSMENT / PLAN: Problem List Items Addressed This Visit    Essential hypertension (Chronic)    Blood pressure looks good off of ARB.  I like this target range to avoid dizziness and fatigue.      Relevant Medications   metolazone (ZAROXOLYN) 2.5 MG tablet   Dyslipidemia, goal LDL below 70 (Chronic)      She continues to be on Crestor. Labs look pretty good.  I would not push them any further given her age and comorbidities.  Continue current dose of Crestor.      Relevant Medications   metolazone (ZAROXOLYN) 2.5 MG tablet   Dizziness (Chronic)    Better since stopping ARB.  Allowing for mild permissive hypertension.      Chronic diastolic heart failure (HCC) - Primary (Chronic)    Chronic diastolic heart failure is a harsh diagnosis.  She has edema but really without any PND orthopnea.  I suspect this is more related to venous stasis  than anything else.  Continue to recommend support stockings, would do above the knee.  Adjust dry weight down to 178 pounds. Recommended that she take her second dose of Bumex earlier in the day as opposed in the evening (roughly 2 PM). Continue with plan sliding scale Bumex based on weight gain with the intention of dropping the weight down from current weight of 182 pounds down to 178 pounds.  Until weight is down 178 pounds, she will use the Zaroxolyn that was prescribed by her nephrologist (2.5 mg) 30 minutes prior to Bumex for Monday Wednesday Friday and then as needed for additional weight gain.       Relevant Medications   metolazone (ZAROXOLYN) 2.5 MG tablet   Other Relevant Orders   EKG 12-Lead (Completed)   CAD S/P percutaneous coronary angioplasty (Chronic)    Distant history of BMS PCI to the LAD with follow-up PTCA for ISR in 2011.  No active angina symptoms.  Negative Myoview in 2013. She is on Plavix which I think we can probably have her take a little less strictly.  She can hold for bruises and potentially take every other day. She is on standing dose of Toprol but no longer on ARB.  Also no longer on Imdur as  she did not derive any benefit. Also no longer on HCTZ since she is now on Bumex.  No further evaluation at this time.      Relevant Medications   metolazone (ZAROXOLYN) 2.5 MG tablet   Other Relevant Orders   EKG 12-Lead (Completed)   Bilateral lower extremity edema (Chronic)    I really do suspect that this is more related to venous stasis.  Continue compression stockings up to thigh-high to avoid discomfort.  Otherwise continue Bumex and Zaroxolyn as noted.          I spent a total of 30 minutes with the patient and chart review. >  50% of the time was spent in direct patient consultation.   Current medicines are reviewed at length with the patient today.  (+/- concerns) n/a The following changes have been made:  see below  Patient Instructions    Medication instructions  Dry weight should be 178 lbs  Take SECOND DOSE  OF BUMEX  AT  2 PM--DAILY   continue Bumex sliding scale Zaroxolyn 2.5 mg  30 min  before morning Bumex  mon- wed- fri. UNTIL WEIGHT  RETURNS TO 178 LBS  THEN  TAKE ZAROXOLYN 2.5 MG 30 MIN BEFORE MORNING DOSE OF BUMEX.      Your physician wants you to follow-up in 4 months with DR HARDING. You will receive a reminder letter in the mail two months in advance. If you don't receive a letter, please call our office to schedule the follow-up appointment.   If you need a refill on your cardiac medications before your next appointment, please call your pharmacy.     Studies Ordered:   Orders Placed This Encounter  Procedures  . EKG 12-Lead      Glenetta Hew, M.D., M.S. Interventional Cardiologist   Pager # [phone] Phone # [phone] [address]. [place],  [address]   Thank you for choosing [facility]!!

## 2017-09-02 ENCOUNTER — Other Ambulatory Visit: Payer: Self-pay | Admitting: *Deleted

## 2017-09-02 NOTE — Patient Outreach (Signed)
[facility]) Care Management  09/02/2017  [name] [birth year] [id number]   EMMI red flag  New or worsening Problems : patient answered yes  Successful outreach call to patient , HIPAA verified. Patient reports no worsening problems ,she questions if she is entering information correctly over the phone,  reports no increased in swelling , no weight increased , today's 181lbs, no shortness of breath. Patient reports wearing her support hose during day and off at night.     Plan  Will continue weekly transition of care outreaches, next  Call in a week.  Reinforced adherence to fluid restriction of 1.5 liters and sodium restrictions    [name], RN, [place] Management Coordinator  [phone]- Mobile [phone]- [place] Office

## 2017-09-02 NOTE — Progress Notes (Signed)
I agree with the above plan 

## 2017-09-02 NOTE — Telephone Encounter (Signed)
OPEN ERROR

## 2017-09-06 ENCOUNTER — Encounter: Payer: Self-pay | Admitting: Cardiology

## 2017-09-06 ENCOUNTER — Other Ambulatory Visit: Payer: Self-pay | Admitting: *Deleted

## 2017-09-06 NOTE — Assessment & Plan Note (Signed)
Blood pressure looks good off of ARB.  I like this target range to avoid dizziness and fatigue.

## 2017-09-06 NOTE — Assessment & Plan Note (Signed)
I really do suspect that this is more related to venous stasis.  Continue compression stockings up to thigh-high to avoid discomfort.  Otherwise continue Bumex and Zaroxolyn as noted.

## 2017-09-06 NOTE — Assessment & Plan Note (Signed)
She continues to be on Crestor. Labs look pretty good.  I would not push them any further given her age and comorbidities.  Continue current dose of Crestor.

## 2017-09-06 NOTE — Patient Outreach (Signed)
[name]) Care Management  09/06/2017  [name] [birth year] [id number]   EMMI red flag  Lost interest in things ? Patient answer yes.    Successful outreach call to patient, HIPAA verified. Explained to patient reason for the call and EMMI red flag regarding lost interest in things questions, patient reports that she is doing okay states she still has interest in doing things just not able to do activity as she did.   Patient reports she is doing well, as far as swelling in her legs it continues to stay down as long as wearing support hose. Patient reports she continues to adhere to her fluid restriction. She reports she is urinating well , weight is 180.5.   Patient reports having some difficulty coughing up thick white sputum, no fever no cold symptoms, denies shortness of breath.   Patient discussed per Dr. Bubba Camp endocrinologist instructed her to stop taking metformin and call in her daily blood sugar and ride on her exercise bike 10 minutes daily, she reports her blood sugars have ranged in 90-124 daily.    Plan Will continue with weekly transition of care calls, next call in a week.    [name], RN, [place] Management Coordinator  [phone]- Mobile [phone]- Toll Free Main Office

## 2017-09-06 NOTE — Assessment & Plan Note (Signed)
Chronic diastolic heart failure is a harsh diagnosis.  She has edema but really without any PND orthopnea.  I suspect this is more related to venous stasis than anything else.  Continue to recommend support stockings, would do above the knee.  Adjust dry weight down to 178 pounds. Recommended that she take her second dose of Bumex earlier in the day as opposed in the evening (roughly 2 PM). Continue with plan sliding scale Bumex based on weight gain with the intention of dropping the weight down from current weight of 182 pounds down to 178 pounds.  Until weight is down 178 pounds, she will use the Zaroxolyn that was prescribed by her nephrologist (2.5 mg) 30 minutes prior to Bumex for Monday Wednesday Friday and then as needed for additional weight gain.

## 2017-09-06 NOTE — Assessment & Plan Note (Signed)
Better since stopping ARB.  Allowing for mild permissive hypertension.

## 2017-09-06 NOTE — Assessment & Plan Note (Addendum)
Distant history of BMS PCI to the LAD with follow-up PTCA for ISR in 2011.  No active angina symptoms.  Negative Myoview in 2013. She is on Plavix which I think we can probably have her take a little less strictly.  She can hold for bruises and potentially take every other day. She is on standing dose of Toprol but no longer on ARB.  Also no longer on Imdur as she did not derive any benefit. Also no longer on HCTZ since she is now on Bumex.  No further evaluation at this time.

## 2017-09-07 ENCOUNTER — Ambulatory Visit: Payer: Self-pay | Admitting: *Deleted

## 2017-09-13 ENCOUNTER — Other Ambulatory Visit: Payer: Self-pay | Admitting: *Deleted

## 2017-09-13 ENCOUNTER — Other Ambulatory Visit: Payer: Self-pay | Admitting: Cardiology

## 2017-09-13 NOTE — Patient Outreach (Signed)
Patient triggered Red on [facility] Heart Failure Dashboard, notification sent to:  [name], RN

## 2017-09-13 NOTE — Patient Outreach (Signed)
[facility]) Care Management  09/13/2017  [name] 01-31-1941 [id number]   EMMI red alert New/worsening problem Yes Recorded weight 185  Telephone assessment   Successful outreach call to patient, reports that she is doing good, discussed reason for the call related to [facility] program. Patient reports her weight was 182.5, no sudden weight increases.  She reports that she continues to have swelling in her legs it is not any worse, states she has found that is she doesn't wear the stockings her legs will swell.  Patient reports she continues to take her medication regimen as prescribed. Wife discussed she is resting well at night.  Plan  Will plan follow up final transition of care call in the next 2 days    [name], RN, [place] Management Coordinator  [phone]- Mobile [phone]- [place]

## 2017-09-15 ENCOUNTER — Other Ambulatory Visit: Payer: Self-pay | Admitting: *Deleted

## 2017-09-15 NOTE — Patient Outreach (Signed)
[facility]) Care Management  09/15/2017  WILNA PENNIE [birth year] [id number]  Final transition of care call   /20 Received referral from telephonic care management  Patient with hospital admission 5/6-5/8, Dx Acute on Chronic heart failure  PMH: includes but not limited to CKD, Diabetes, HTN, Heart Failure, breast cancer  Successful outreach call to patient,HIPAA verified. Patient reports that she is good on today.  Today weight: 180 lbs, goal weight 179, denies increases shortness of breath or increase in shortness of breath.  Patient reports her feet remain puffy if she does wear support hose that she wears daily.  Patient reports she had one of her episodes on Sunday when she felt weak and blood pressure drop to 94/57-84, denies fall states after resting she felt better and able to go out shopping.   Patient discussed having cloudy urine noted when voiding, she will have her husband cath her today to check on residual and plans to notify PCP for office visit to evaluate for UTI, discussed her recent treatment with antibiotics.   Patient agreeable to follow up visit in next month, she has completed transition of care program, will continue to follow in complex care management for chronic condition of heart failure   Plan Will plan home visit in the next month. Reinforced with patient notifying PCP regarding symptoms of UTI for sooner visit and evaluation to avoid admission.    [name], RN, [employer] Management Coordinator  [phone]- Mobile [phone]- Toll Free Main Office

## 2017-09-16 ENCOUNTER — Telehealth (INDEPENDENT_AMBULATORY_CARE_PROVIDER_SITE_OTHER): Payer: Self-pay | Admitting: Orthopedic Surgery

## 2017-09-16 NOTE — Telephone Encounter (Signed)
Please call pt back and offer the first available appt

## 2017-09-16 NOTE — Telephone Encounter (Signed)
Patients husband called wanting to make his wife an appointment to be seen so she can get a refill on her medication. He states she's very sick and needs to be seen as soon as possible. CB # [phone]

## 2017-09-21 ENCOUNTER — Ambulatory Visit: Payer: Medicare Other | Admitting: Cardiology

## 2017-10-04 DIAGNOSIS — N184 Chronic kidney disease, stage 4 (severe): Secondary | ICD-10-CM | POA: Diagnosis not present

## 2017-10-05 ENCOUNTER — Other Ambulatory Visit: Payer: Self-pay | Admitting: *Deleted

## 2017-10-05 NOTE — Patient Outreach (Signed)
[name]) Care Management   10/05/2017  [name] September 03, 1940 [id number]  [name] is an 77 y.o. female  Subjective:   [name] states she slept well on last night and has a little more energy on today.   Patient discussed swelling in her legs has improved since wearing compression hose regularly.   She voiced  concern with diabetes and possibility of having to start insulin after metformin discontinued due to affects on kidney per patient.  Patient discussed having a slip to the floor recently, while walking in compression hose without shoes, denies injury.  Patient complaint of having back discomfort after doing cleaning activity in home, report she had this comfort prior to slip to floor.  Patient reports that she has not had any further episodes of passing out.  Patient shared that her urine is cloudy and she is planning to make an appointment with [facility], report she husband in and out caths her about twice weekly as needed.    Objective:  BP 102/60 (BP Location: Right Arm, Patient Position: Sitting, Cuff Size: Normal)   Pulse 92   Resp 18   Wt 180 lb (81.6 kg)   SpO2 98%   BMI 29.05 kg/m  Review of Systems  Constitutional: Negative.   HENT: Negative.   Eyes: Negative.   Respiratory: Negative.   Cardiovascular: Positive for leg swelling.       Wearing compression hose  Gastrointestinal: Negative.   Genitourinary: Negative.   Musculoskeletal: Negative.   Skin: Negative.   Neurological: Negative.   Endo/Heme/Allergies: Negative.   Psychiatric/Behavioral: Negative.     Physical Exam  Constitutional: She is oriented to person, place, and time. She appears well-developed and well-nourished.  Cardiovascular: Normal rate and normal heart sounds.  Respiratory: Effort normal.  GI: Soft.  Neurological: She is alert and oriented to person, place, and time.  Skin: Skin is warm and dry.  Psychiatric: She has a normal mood and affect. Her  behavior is normal. Judgment and thought content normal.    Encounter Medications:   Outpatient Encounter Medications as of 10/05/2017  Medication Sig Note  . allopurinol (ZYLOPRIM) 100 MG tablet Take 1 tablet (100 mg total) by mouth daily.   . bumetanide (BUMEX) 2 MG tablet Take 2 mg by mouth daily. Patient currently taking double dose ( 4 mg daily )   . Calcium Carb-Cholecalciferol (CALCIUM 600 + D PO) Take 1 tablet by mouth at bedtime.    . Cholecalciferol 2000 units CAPS Take 2,000 Units by mouth daily.   . ciprofloxacin (CIPRO) 500 MG tablet completes today 09/01/17   . clopidogrel (PLAVIX) 75 MG tablet Take 1 tablet (75 mg total) by mouth daily. Due for f/u appt, please call office   . COD LIVER OIL PO Take 415 mg by mouth daily.   Marland Kitchen CRANBERRY PO Take 1 capsule by mouth daily.   . diazepam (VALIUM) 5 MG tablet Take 5 mg by mouth daily as needed for anxiety.   . diphenhydrAMINE (BENADRYL) 50 MG capsule Take 50 mg by mouth at bedtime as needed for sleep.    Marland Kitchen exemestane (AROMASIN) 25 MG tablet Take 1 tablet (25 mg total) daily after breakfast by mouth.   . Fish Oil-Krill Oil (KRILL OIL PLUS PO) Take 1 capsule by mouth daily.    Marland Kitchen gabapentin (NEURONTIN) 300 MG capsule Take 1 capsule (300 mg total) by mouth 2 (two) times daily. Take 1 during the day if needed and 2  at night   . Iron-FA-B Cmp-C-Biot-Probiotic (FUSION PLUS) CAPS takes 1 tablet daily   . Melatonin 5 MG CAPS Take 5 mg by mouth at bedtime as needed (sleep).    . metFORMIN (GLUCOPHAGE) 500 MG tablet Take by mouth 2 (two) times daily with a meal.   . Methylfol-Methylcob-Acetylcyst (CEREFOLIN NAC) 6-2-600 MG TABS TAKE 1 CAPLET BY MOUTH ONCE DAILY. 09/01/2017: 09/01/17 must come from [employer]  . metolazone (ZAROXOLYN) 2.5 MG tablet Take 30 min before the morning dose of bumex as directed daily.   . metoprolol succinate (TOPROL XL) 50 MG 24 hr tablet TAKE ONE-HALF (1/2) TABLET DAILY   . mirtazapine (REMERON) 15 MG tablet Take 15 mg by  mouth at bedtime. 11/2 tablet at bedtime   . pantoprazole (PROTONIX) 40 MG tablet TAKE 1 TABLET DAILY (CONTACT OFFICE FOR ADDITIONAL REFILLS, FINAL ATTEMPT)   . pramipexole (MIRAPEX) 0.25 MG tablet TAKE 1 TABLET AT 4:00 P.M. AND 2 TABLETS (0.5 MG) AT BEDTIME 09/01/2017: 09/01/17 must come from [employer] [facility]  . Resveratrol (RESERVAPAK PO) Take 1 tablet by mouth every evening.    . rosuvastatin (CRESTOR) 20 MG tablet Take 20 mg by mouth at bedtime.   Marland Kitchen Ubiquinol (QUNOL COQ10/UBIQUINOL/MEGA) 100 MG CAPS Take 1 tablet by mouth daily. CoQ10, Qunol    No facility-administered encounter medications on file as of 10/05/2017.     Functional Status:   In your present state of health, do you have any difficulty performing the following activities: 08/16/2017 08/02/2017  Hearing? N N  Vision? N N  Difficulty concentrating or making decisions? N N  Walking or climbing stairs? Y Y  Dressing or bathing? N N  Doing errands, shopping? N N  Comment husband helps -  Conservation officer, nature and eating ? N -  Using the Toilet? N -  In the past six months, have you accidently leaked urine? N -  Do you have problems with loss of bowel control? N -  Managing your Medications? Y -  Comment husband helps  -  Managing your Finances? N -  Housekeeping or managing your Housekeeping? N -  Some recent data might be hidden    Fall/Depression Screening:    Fall Risk  09/01/2017 08/23/2017 08/15/2017  Falls in the past year? Yes Yes Yes  Number falls in past yr: 2 or more 1 1  Injury with Fall? No No -  Risk for fall due to : Impaired balance/gait Impaired balance/gait;History of fall(s) History of fall(s);Impaired balance/gait;Impaired mobility  Follow up - Falls evaluation completed Falls evaluation completed   PHQ 2/9 Scores 08/23/2017 03/21/2017  PHQ - 2 Score 1 1    Assessment:  Routine home visit, patient husband present.  Heart failure- weigh staying 180 range, no sudden weight gains or increase in sob. Consistently  monitoring weights, taking medications as prescribed. Patient adhering to low sodium diet and 2000 ml fluid restriction . Urine output as 2500 - 300  Leg swelling- decreased since wearing compression hose on regular basis   Diabetes- monitoring 3 times  weekly highest reading 170, 7 day average reading 141, no low readings, today CBG 111.  Recent Alc 5.4 .Has upcoming follow up visit with endocrinology to discuss treatment plan.   During visit, patient received phone call from [place] office reporting patient recent lab result showed her potassium level was low and prescription to be called in and patient for repeat lab on Friday. Patient inquired about other lab concerns and states her creatinine was a  little more elevated, patient became a little upset about this, and discussing her kidney function problem.  Plan:  Reviewed fall prevention measures, reinforced wearing non slip sock/shoes  Encouraged making follow up appointment with urologist.  Reinforced completing potassium as prescribed.  Will plan follow up call in the next 3 weeks. Will send visit note to PCP.   THN CM Care Plan Problem One     Most Recent Value  Care Plan Problem One  Recent hospital admission related to Heart failure   Role Documenting the Problem One  Care Management [place] for Problem One  Active  THN Long Term Goal   Patient will not experience a hospital admission in the next 90 days   THN Long Term Goal Start Date  08/16/17  Interventions for Problem One Long Term Goal  Home visit completed, reinforeced continuing to monitor weights, notify MD of sudden weight gains, swelling , reviewed living with heart failure , handout reinforced adhering to fluid and salt limtis of 2 liters/2 gm.  THN CM Short Term Goal #1   Over the next 30 days patient with continue to weigh daily and keep a record   THN CM Short Term Goal #1 Start Date  08/17/17  [facility] CM Short Term Goal #1 Met Date  09/01/17  THN CM  Short Term Goal #2   Over the next 30 days patient will be able to report at 3 symptoms of heart failure and action plan zone   [facility] CM Short Term Goal #2 Start Date  08/17/17  [facility] CM Short Term Goal #2 Met Date  09/06/17  THN CM Short Term Goal #3  Over the next 30 days patient will be able to report decreased swelling in legs   THN CM Short Term Goal #3 Start Date  08/30/17  [facility] CM Short Term Goal #3 Met Date  10/06/17 [late entry for met date ]  [facility] CM Short Term Goal #4  Patient will be able to report increased knowledge of low salt meal planning diet over the next 30 days   THN CM Short Term Goal #4 Start Date  10/06/17  Interventions for Short Term Goal #4  Reviewed nutriiton label and sodium limits, reviewed rationale for limiting salt in diet.       [name], RN, [place] Management Coordinator  [phone] Mobile [phone]- Toll Free Main Office

## 2017-10-07 DIAGNOSIS — E876 Hypokalemia: Secondary | ICD-10-CM | POA: Diagnosis not present

## 2017-10-10 DIAGNOSIS — E876 Hypokalemia: Secondary | ICD-10-CM | POA: Diagnosis not present

## 2017-10-12 ENCOUNTER — Other Ambulatory Visit: Payer: Self-pay | Admitting: Pharmacist

## 2017-10-12 DIAGNOSIS — N3020 Other chronic cystitis without hematuria: Secondary | ICD-10-CM | POA: Diagnosis not present

## 2017-10-12 DIAGNOSIS — N3000 Acute cystitis without hematuria: Secondary | ICD-10-CM | POA: Diagnosis not present

## 2017-10-12 DIAGNOSIS — N139 Obstructive and reflux uropathy, unspecified: Secondary | ICD-10-CM | POA: Diagnosis not present

## 2017-10-12 DIAGNOSIS — N312 Flaccid neuropathic bladder, not elsewhere classified: Secondary | ICD-10-CM | POA: Diagnosis not present

## 2017-10-12 NOTE — Patient Outreach (Signed)
[facility]) Care Management  10/12/2017  AI SONNENFELD 14-Dec-1940 [id number]  Patient referred to [place] for EMMI red alert related to medication assistance.    Patient did not respond to initial contact attempt from [place].  Spoke with [facility] RN who is actively involved with patient's care regarding pharmacy needs.  [facility] RN unaware of any current medication assistance needs or any medication concerns.  Per review of medication list, patient is not taking any brand name prescription medications that would be Tier 3 or not covered by insurance.   [facility] pharmacy will close case at this time.  I am happy to assist in the future as needed.   Ralene Bathe, PharmD, [place] [phone]

## 2017-10-14 ENCOUNTER — Ambulatory Visit
Admission: RE | Admit: 2017-10-14 | Discharge: 2017-10-14 | Disposition: A | Discharge status: Home / Self Care | Payer: Medicare Other | Source: Ambulatory Visit | Attending: Hematology | Admitting: Hematology

## 2017-10-14 DIAGNOSIS — C50211 Malignant neoplasm of upper-inner quadrant of right female breast: Secondary | ICD-10-CM

## 2017-10-14 DIAGNOSIS — R928 Other abnormal and inconclusive findings on diagnostic imaging of breast: Secondary | ICD-10-CM | POA: Diagnosis not present

## 2017-10-14 DIAGNOSIS — Z170 Estrogen receptor positive status [ER+]: Secondary | ICD-10-CM

## 2017-10-14 DIAGNOSIS — Z853 Personal history of malignant neoplasm of breast: Secondary | ICD-10-CM | POA: Diagnosis not present

## 2017-10-14 DIAGNOSIS — C50412 Malignant neoplasm of upper-outer quadrant of left female breast: Secondary | ICD-10-CM

## 2017-10-14 HISTORY — DX: Malignant neoplasm of unspecified site of unspecified female breast: C50.919

## 2017-10-15 ENCOUNTER — Other Ambulatory Visit: Payer: Self-pay | Admitting: Neurology

## 2017-10-17 ENCOUNTER — Other Ambulatory Visit: Payer: Self-pay

## 2017-10-17 MED ORDER — PRAMIPEXOLE DIHYDROCHLORIDE 0.25 MG PO TABS: ORAL_TABLET | ORAL | 2 refills | Status: DC

## 2017-10-18 ENCOUNTER — Other Ambulatory Visit: Payer: Self-pay | Admitting: Cardiology

## 2017-10-19 NOTE — Telephone Encounter (Signed)
Rx sent to pharmacy   

## 2017-10-26 ENCOUNTER — Other Ambulatory Visit: Payer: Self-pay | Admitting: *Deleted

## 2017-10-26 NOTE — Patient Outreach (Signed)
[facility]) Care Management  [place]  10/26/2017   [name] 06-Nov-1940 [id number]   Telephone assessment   5/20 Received referral from telephonic care management  Patient with hospital admission 5/6-5/8, Dx Acute on Chronic heart failure  PMH: includes but not limited to CKD, Diabetes, HTN, Heart Failure, breast cancer   Subjective:  Successful outreach call to patient, HIPAA verified. Patient discussed that she is doing really well, she discussed having a little more energy since her potassium level has increased.  Patient discussed that she continues to rest and then do a little work around the house. Patient discussed that she is tolerating walking on treadmill about 10 minutes each day.   Patient discussed that she still has swelling in her legs that goes and comes but is much better since wearing support hose.  Patient discussed that her weights are staying in the range of 177- 179, denies shortness of breath .   Patient discussed recent visit to urologist and she is completing antibiotic for UTI. Patient reports that her husband in and out caths her about 2 times a week.   Patient reports her blood sugars are staying in the range of 124 to 188, she has follow up visit doctor about restarting medication.    Encounter Medications:  Outpatient Encounter Medications as of 10/26/2017  Medication Sig Note  . allopurinol (ZYLOPRIM) 100 MG tablet Take 1 tablet (100 mg total) by mouth daily.   . bumetanide (BUMEX) 2 MG tablet Take 2 mg by mouth daily. Patient currently taking double dose ( 4 mg daily )   . Calcium Carb-Cholecalciferol (CALCIUM 600 + D PO) Take 1 tablet by mouth at bedtime.    . Cholecalciferol 2000 units CAPS Take 2,000 Units by mouth daily.   . ciprofloxacin (CIPRO) 500 MG tablet completes today 09/01/17   . clopidogrel (PLAVIX) 75 MG tablet Take 1 tablet (75 mg total) by mouth daily.   . COD LIVER OIL PO Take 415 mg by mouth daily.   Marland Kitchen  CRANBERRY PO Take 1 capsule by mouth daily.   . diazepam (VALIUM) 5 MG tablet Take 5 mg by mouth daily as needed for anxiety.   . diphenhydrAMINE (BENADRYL) 50 MG capsule Take 50 mg by mouth at bedtime as needed for sleep.    Marland Kitchen exemestane (AROMASIN) 25 MG tablet Take 1 tablet (25 mg total) daily after breakfast by mouth.   . Fish Oil-Krill Oil (KRILL OIL PLUS PO) Take 1 capsule by mouth daily.    Marland Kitchen gabapentin (NEURONTIN) 300 MG capsule Take 1 capsule (300 mg total) by mouth 2 (two) times daily. Take 1 during the day if needed and 2 at night   . Iron-FA-B Cmp-C-Biot-Probiotic (FUSION PLUS) CAPS takes 1 tablet daily   . Melatonin 5 MG CAPS Take 5 mg by mouth at bedtime as needed (sleep).    . metFORMIN (GLUCOPHAGE) 500 MG tablet Take by mouth 2 (two) times daily with a meal.   . Methylfol-Methylcob-Acetylcyst (CEREFOLIN NAC) 6-2-600 MG TABS TAKE 1 CAPLET BY MOUTH ONCE DAILY. 09/01/2017: 09/01/17 must come from [employer]  . metolazone (ZAROXOLYN) 2.5 MG tablet Take 30 min before the morning dose of bumex as directed daily. 10/05/2017: Patient reports taking Monday, Wednesday and Friday   . metoprolol succinate (TOPROL XL) 50 MG 24 hr tablet TAKE ONE-HALF (1/2) TABLET DAILY   . mirtazapine (REMERON) 15 MG tablet Take 15 mg by mouth at bedtime. 11/2 tablet at bedtime   .  pantoprazole (PROTONIX) 40 MG tablet TAKE 1 TABLET DAILY (CONTACT OFFICE FOR ADDITIONAL REFILLS, FINAL ATTEMPT)   . pramipexole (MIRAPEX) 0.25 MG tablet TAKE 1 TABLET AT 4:00 P.M. AND 2 TABLETS (0.5 MG) AT BEDTIME   . Resveratrol (RESERVAPAK PO) Take 1 tablet by mouth every evening.    . rosuvastatin (CRESTOR) 20 MG tablet Take 20 mg by mouth at bedtime.   Marland Kitchen Ubiquinol (QUNOL COQ10/UBIQUINOL/MEGA) 100 MG CAPS Take 1 tablet by mouth daily. CoQ10, Qunol    No facility-administered encounter medications on file as of 10/26/2017.     Functional Status:  In your present state of health, do you have any difficulty performing the following  activities: 08/16/2017 08/02/2017  Hearing? N N  Vision? N N  Difficulty concentrating or making decisions? N N  Walking or climbing stairs? Y Y  Dressing or bathing? N N  Doing errands, shopping? N N  Comment husband helps -  Conservation officer, nature and eating ? N -  Using the Toilet? N -  In the past six months, have you accidently leaked urine? N -  Do you have problems with loss of bowel control? N -  Managing your Medications? Y -  Comment husband helps  -  Managing your Finances? N -  Housekeeping or managing your Housekeeping? N -  Some recent data might be hidden    Fall/Depression Screening: Fall Risk  09/01/2017 08/23/2017 08/15/2017  Falls in the past year? Yes Yes Yes  Number falls in past yr: 2 or more 1 1  Injury with Fall? No No -  Risk for fall due to : Impaired balance/gait Impaired balance/gait;History of fall(s) History of fall(s);Impaired balance/gait;Impaired mobility  Follow up - Falls evaluation completed Falls evaluation completed   PHQ 2/9 Scores 08/23/2017 03/21/2017  PHQ - 2 Score 1 1    Assessment:  Heart failure - weights stable, no sudden weight gains , identifies being in green zone , tolerating increased activity with resting between activities.  Diabetes- continuing with monitoring has follow up visit in next week with endocrinologist.  UTI- completing antibiotics and has visit with new urologist in next month.  Plan:  Will plan follow up 90 day visit in the next month.   [name], RN, [place] Management Coordinator  [phone]- Mobile [phone]- Toll Free Main Office

## 2017-11-01 DIAGNOSIS — E1165 Type 2 diabetes mellitus with hyperglycemia: Secondary | ICD-10-CM | POA: Diagnosis not present

## 2017-11-01 DIAGNOSIS — N189 Chronic kidney disease, unspecified: Secondary | ICD-10-CM | POA: Diagnosis not present

## 2017-11-01 DIAGNOSIS — G609 Hereditary and idiopathic neuropathy, unspecified: Secondary | ICD-10-CM | POA: Diagnosis not present

## 2017-11-01 DIAGNOSIS — E7800 Pure hypercholesterolemia, unspecified: Secondary | ICD-10-CM | POA: Diagnosis not present

## 2017-11-01 DIAGNOSIS — I10 Essential (primary) hypertension: Secondary | ICD-10-CM | POA: Diagnosis not present

## 2017-11-15 ENCOUNTER — Encounter: Payer: Self-pay | Admitting: *Deleted

## 2017-11-15 ENCOUNTER — Other Ambulatory Visit: Payer: Self-pay | Admitting: *Deleted

## 2017-11-15 ENCOUNTER — Telehealth (INDEPENDENT_AMBULATORY_CARE_PROVIDER_SITE_OTHER): Payer: Self-pay | Admitting: Orthopedic Surgery

## 2017-11-15 NOTE — Patient Outreach (Signed)
[facility]) Care Management   11/15/2017  [name] Apr 26, 1940 [id number]  KENT BRAUNSCHWEIG is an 77 y.o. female   Routine home visit   Subjective:  Patient discussed she had one of her episodes of  weak feeling and low blood pressure 87/52  it has been a while, it takes me a while to feel better after. Patient states she was rushing around getting things done in the house. 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    Patient discussed that  she has been started on insulin and her blood sugars have been all over the place, 90 to 200, and she is not used to having blood sugars in range of 200 reports she usually runs 98 to 115.    Objective:  BP 108/60 (BP Location: Right Arm, Patient Position: Sitting, Cuff Size: Normal)   Pulse 83   Ht 1.676 m ('5\' 6"'$ )   Wt 180 lb (81.6 kg)   SpO2 97%   BMI 29.05 kg/m  Review of Systems  Constitutional: Negative.   HENT: Negative.   Eyes: Negative.   Respiratory: Negative.   Cardiovascular: Positive for leg swelling.  Gastrointestinal: Negative.   Genitourinary: Negative.        In and out cath prn   Musculoskeletal: Negative.   Skin: Negative.   Neurological: Positive for dizziness.  Endo/Heme/Allergies: Bruises/bleeds easily.  Psychiatric/Behavioral: Positive for memory loss. The patient is nervous/anxious.     Physical Exam  Constitutional: She is oriented to person, place, and time. She appears well-developed and well-nourished.  Cardiovascular: Normal rate and normal heart sounds.  Respiratory: Effort normal and breath sounds normal.  GI: Soft. Bowel sounds are normal.  Neurological: She is alert and oriented to person, place, and time.  Skin: Skin is warm and dry.  Psychiatric: She has a normal mood and affect. Her behavior is normal. Judgment and thought content normal.    Encounter Medications:    Outpatient Encounter Medications as of 11/15/2017  Medication Sig Note  . allopurinol (ZYLOPRIM) 100 MG tablet Take 1 tablet (100 mg total) by mouth daily.   . bumetanide (BUMEX) 2 MG tablet Take 2 mg by mouth daily. Patient currently taking double dose ( 4 mg daily )   . Calcium Carb-Cholecalciferol (CALCIUM 600 + D PO) Take 1 tablet by mouth at bedtime.    . Cholecalciferol 2000 units CAPS Take 2,000 Units by mouth daily.   . ciprofloxacin (CIPRO) 500 MG tablet completes today 09/01/17   . clopidogrel (PLAVIX) 75 MG tablet Take 1 tablet (75 mg total) by mouth daily.   . COD LIVER OIL PO Take 415 mg by mouth daily.   Marland Kitchen CRANBERRY PO Take 1 capsule by mouth daily.   . diazepam (VALIUM) 5 MG tablet Take 5 mg by mouth daily as needed for anxiety.   . diphenhydrAMINE (BENADRYL) 50 MG capsule Take 50 mg by mouth at bedtime as needed for sleep.    Marland Kitchen exemestane (AROMASIN) 25 MG tablet Take 1 tablet (25 mg total) daily after breakfast by mouth.   . Fish Oil-Krill Oil (KRILL OIL PLUS PO) Take 1 capsule by mouth daily.    Marland Kitchen gabapentin (NEURONTIN) 300 MG capsule Take 1 capsule (300 mg total) by mouth 2 (two) times daily. Take 1 during the day if needed and 2 at night   . Iron-FA-B Cmp-C-Biot-Probiotic (  FUSION PLUS) CAPS takes 1 tablet daily   . Melatonin 5 MG CAPS Take 5 mg by mouth at bedtime as needed (sleep).    . metFORMIN (GLUCOPHAGE) 500 MG tablet Take by mouth 2 (two) times daily with a meal.   . Methylfol-Methylcob-Acetylcyst (CEREFOLIN NAC) 6-2-600 MG TABS TAKE 1 CAPLET BY MOUTH ONCE DAILY. 09/01/2017: 09/01/17 must come from [employer]  . metolazone (ZAROXOLYN) 2.5 MG tablet Take 30 min before the morning dose of bumex as directed daily. 10/05/2017: Patient reports taking Monday, Wednesday and Friday   . metoprolol succinate (TOPROL XL) 50 MG 24 hr tablet TAKE ONE-HALF (1/2) TABLET DAILY   . mirtazapine (REMERON) 15 MG tablet Take 15 mg by mouth at bedtime. 11/2 tablet at bedtime   . pantoprazole  (PROTONIX) 40 MG tablet TAKE 1 TABLET DAILY (CONTACT OFFICE FOR ADDITIONAL REFILLS, FINAL ATTEMPT)   . pramipexole (MIRAPEX) 0.25 MG tablet TAKE 1 TABLET AT 4:00 P.M. AND 2 TABLETS (0.5 MG) AT BEDTIME   . Resveratrol (RESERVAPAK PO) Take 1 tablet by mouth every evening.    . rosuvastatin (CRESTOR) 20 MG tablet Take 20 mg by mouth at bedtime.   Marland Kitchen Ubiquinol (QUNOL COQ10/UBIQUINOL/MEGA) 100 MG CAPS Take 1 tablet by mouth daily. CoQ10, Qunol    No facility-administered encounter medications on file as of 11/15/2017.     Functional Status:   In your present state of health, do you have any difficulty performing the following activities: 08/16/2017 08/02/2017  Hearing? N N  Vision? N N  Difficulty concentrating or making decisions? N N  Walking or climbing stairs? Y Y  Dressing or bathing? N N  Doing errands, shopping? N N  Comment husband helps -  Conservation officer, nature and eating ? N -  Using the Toilet? N -  In the past six months, have you accidently leaked urine? N -  Do you have problems with loss of bowel control? N -  Managing your Medications? Y -  Comment husband helps  -  Managing your Finances? N -  Housekeeping or managing your Housekeeping? N -  Some recent data might be hidden    Fall/Depression Screening:    Fall Risk  09/01/2017 08/23/2017 08/15/2017  Falls in the past year? Yes Yes Yes  Number falls in past yr: 2 or more 1 1  Injury with Fall? No No -  Risk for fall due to : Impaired balance/gait Impaired balance/gait;History of fall(s) History of fall(s);Impaired balance/gait;Impaired mobility  Follow up - Falls evaluation completed Falls evaluation completed   PHQ 2/9 Scores 08/23/2017 03/21/2017  PHQ - 2 Score 1 1    Assessment:  Routine home visit   Heart failure - weight staying in 178 to 180 range, today's weight 180, reports goal weight is 178, reports she does not maintain that weight, and continues metolazone as prescribed.  Leg swelling improved, with wearing support  hose. Patient adhering to low sodium diet of 2000 mg and 1500 to 2000 ml fluid restriction. Patient husband keeps a meticulous record of patient daily weight, blood pressures, output.   Diabetes- now on insulin, patient  husband  Administering insulin .   14 day blood sugar range 161, today's reading 119.  No low blood sugar levels less than 70. Review of low blood sugar symptoms and treatment plan , patient able to state, emphasized recheck. Checking blood sugars 3 to 4 times a day. Patient has attended nutrition classes in the past and declines need to attend at this time.  CKD- Follow up with [name] in the next 3 weeks.   Medications - patient husband manages patient medication organization , need refill on zyloprim , will call Dr.Duda office.    Plan:  Care planning and goal setting at visit  Reinforced with patient/husband to notify MD of increase in recurrent episodes of low blood pressure episodes.  Will send EMMI on diabetic meal planning  Will send PCP visit note.  Will continue to follow patient for complex care management for chronic conditions of Diabetes and heart failure .  Will plan home visit in the next month   [facility] CM Care Plan Problem One     Most Recent Value  Care Plan Problem One  Recent hospital admission related to Heart failure   Role Documenting the Problem One  Care Management [place] for Problem One  Not Active  [facility] Long Term Goal   Patient will not experience a hospital admission in the next 90 days   [facility] Long Term Goal Start Date  08/16/17  [facility] Long Term Goal Met Date  11/15/17  [facility] CM Short Term Goal #1   Over the next 30 days patient with continue to weigh daily and keep a record   [facility] CM Short Term Goal #1 Start Date  08/17/17  [facility] CM Short Term Goal #1 Met Date  09/01/17  [facility] CM Short Term Goal #2   Over the next 30 days patient will be able to report at 3 symptoms of heart failure and action plan zone   [facility] CM Short Term Goal #2  Start Date  08/17/17  [facility] CM Short Term Goal #2 Met Date  09/06/17  [facility] CM Short Term Goal #3  Over the next 30 days patient will be able to report decreased swelling in legs   [facility] CM Short Term Goal #3 Start Date  08/30/17  [facility] CM Short Term Goal #3 Met Date  10/06/17 [late entry for met date ]  [facility] CM Short Term Goal #4  Patient will be able to report increased knowledge of low salt meal planning diet over the next 30 days   [facility] CM Short Term Goal #4 Start Date  10/06/17  Interventions for Short Term Goal #4  Reinforced adherence to low salt diet , discuss label reading and daily limits of salt .     [facility] CM Care Plan Problem Two     Most Recent Value  Care Plan Problem Two  Knowledge related to Diabetes, new to insulin   Role Documenting the Problem Two  Care Management Coordinator  Care Plan for Problem Two  Active  Interventions for Problem Two Long Term Goal   Reviewed new insulin orders, discussed recent A1c results and ranges in relation to blood sugars , normal and goal ranges   [facility] Long Term Goal  Patient will be able to report increase in knowledge of Diabetes self care managment over the next 60 days   [facility] Long Term Goal Start Date  11/15/17  [facility] CM Short Term Goal #1   Patient will report increased knowledge of snack food options over the next 30 days   [facility] CM Short Term Goal #1 Start Date  11/15/17  Interventions for Short Term Goal #2   will send patient EMMI on meal planning and her interest in snack options .  [facility] CM Short Term Goal #2   Patient will report participating in self administering insulin over the next 30 days  [facility] CM Short Term Goal #2 Start Date  11/15/17  Interventions for Short Term Goal #2  Reviewed steps on using insulin pen, from handout they have from MD office noted supplies on hand, encouraged patient regarding starting with one step at a time.        [name], RN, [facility] Management Coordinator  [phone]-  Mobile [phone]- Toll Free Main Office

## 2017-11-15 NOTE — Telephone Encounter (Signed)
Can you please call. Pt needs office visit for follow up and lab work prior to refill. appt for next week please.

## 2017-11-15 NOTE — Telephone Encounter (Signed)
Patient left a voicemail requesting rx refill on allopurinol. Patients # [phone] or [phone]

## 2017-11-16 NOTE — Telephone Encounter (Signed)
Dr. Gavin Potters only appt was for 8/29. Patient asks if you could refill the allopurinol until then. I did explain that you wanted her to be seen and have lab work. If you can, her new pharmacy is [facility] on [address]. Patients # [phone]

## 2017-11-17 ENCOUNTER — Other Ambulatory Visit (INDEPENDENT_AMBULATORY_CARE_PROVIDER_SITE_OTHER): Payer: Self-pay

## 2017-11-17 MED ORDER — ALLOPURINOL 100 MG PO TABS: 100.0000 mg | ORAL_TABLET | Freq: Two times a day (BID) | ORAL | 0 refills | Status: DC

## 2017-11-17 NOTE — Telephone Encounter (Signed)
I called pt to advise that rx has been faxed to pharm. To call with questions otherwise will see next week.

## 2017-11-24 ENCOUNTER — Encounter (INDEPENDENT_AMBULATORY_CARE_PROVIDER_SITE_OTHER): Payer: Self-pay | Admitting: Orthopedic Surgery

## 2017-11-24 ENCOUNTER — Ambulatory Visit (INDEPENDENT_AMBULATORY_CARE_PROVIDER_SITE_OTHER): Payer: Medicare Other | Admitting: Physician Assistant

## 2017-11-24 ENCOUNTER — Ambulatory Visit (INDEPENDENT_AMBULATORY_CARE_PROVIDER_SITE_OTHER): Payer: Self-pay

## 2017-11-24 DIAGNOSIS — M79672 Pain in left foot: Secondary | ICD-10-CM | POA: Diagnosis not present

## 2017-11-24 DIAGNOSIS — Z9861 Coronary angioplasty status: Secondary | ICD-10-CM

## 2017-11-24 DIAGNOSIS — I2510 Atherosclerotic heart disease of native coronary artery without angina pectoris: Secondary | ICD-10-CM

## 2017-11-24 DIAGNOSIS — M6702 Short Achilles tendon (acquired), left ankle: Secondary | ICD-10-CM | POA: Diagnosis not present

## 2017-11-24 DIAGNOSIS — M79671 Pain in right foot: Secondary | ICD-10-CM | POA: Diagnosis not present

## 2017-11-24 DIAGNOSIS — M1A0720 Idiopathic chronic gout, left ankle and foot, without tophus (tophi): Secondary | ICD-10-CM

## 2017-11-24 NOTE — Progress Notes (Deleted)
Office Visit Note   Patient: [name]           Date of Birth: 06/08/1940           MRN: [id number] Visit Date: 11/24/2017              Requested by: [address], [address], [place] [id number] PCP: [employer] [employer], MD  Chief Complaint  Patient presents with  . Right Foot - Pain  . Left Foot - Pain      HPI: ***  Assessment & Plan: Visit Diagnoses:  1. Bilateral foot pain     Plan: ***  Follow-Up Instructions: No follow-ups on file.   Ortho Exam  Patient is alert, oriented, no adenopathy, well-dressed, normal affect, normal respiratory effort. ***  Imaging: No results found. No images are attached to the encounter.  Labs: Lab Results  Component Value Date   HGBA1C 5.7 (H) 12/07/2016   HGBA1C 5.8 (H) 05/06/2016   HGBA1C 6.0 (H) 11/17/2015   LABURIC 7.9 (H) 11/11/2016   REPTSTATUS 11/18/2015 FINAL 11/17/2015   CULT (A) 11/17/2015    50,000 COLONIES/mL LACTOBACILLUS SPECIES Standardized susceptibility testing for this organism is not available.    LABORGA ESCHERICHIA COLI 12/31/2009     Lab Results  Component Value Date   ALBUMIN 3.8 08/15/2017   ALBUMIN 4.7 08/01/2017   ALBUMIN 3.7 04/12/2017   LABURIC 7.9 (H) 11/11/2016    There is no height or weight on file to calculate BMI.  Orders:  Orders Placed This Encounter  Procedures  . XR Foot Complete Left  . XR Foot Complete Right   No orders of the defined types were placed in this encounter.    Procedures: No procedures performed  Clinical Data: No additional findings.  ROS:  All other systems negative, except as noted in the HPI. Review of Systems  Objective: Vital Signs: There were no vitals taken for this visit.  Specialty Comments:  No specialty comments available.  PMFS History: Patient Active Problem List   Diagnosis Date Noted  . Right atrial mass   . Chronic diastolic heart failure ([place]) 08/01/2017  . Genetic testing 12/03/2016  .  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 Malignant neoplasm of upper-outer quadrant of left breast in female, estrogen receptor positive ([place]) 11/19/2016  . Chronic idiopathic gout involving toe of left foot without tophus 11/15/2016  . Malignant 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, estrogen receptor positive ([place]) 11/09/2016  . Tendon tear, ankle, left, sequela   . Traumatic rupture of left anterior tibial tendon 04/20/2016  . Acute on chronic kidney failure ([place])   . 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 ([place])   . Headache, migraine   . Unexplained weight loss 11/17/2015  . Acute kidney injury superimposed on chronic kidney disease ([employer]) 11/17/2015  . Restless legs syndrome 07/24/2015  . Mild cognitive impairment 01/08/2015  . Abnormality of gait 01/08/2015  . Dizziness 12/19/2014  . Preoperative cardiovascular examination 12/11/2013  . GERD (gastroesophageal reflux disease) 10/06/2013  . Syncope 10/05/2013  . Dementia 12/06/2012  . Anxiety 10/28/2012    Class: Acute  . Bilateral lower extremity edema 10/28/2012  . CAD S/P percutaneous coronary angioplasty   . Essential hypertension   . Dyslipidemia, goal LDL below 70    Past Medical History:  Diagnosis Date  . Anxiety   . Arthritis     osteoarthritis  . Breast cancer ([place])   . Breast cancer in female [facility])  Bilateral  . CAD S/P percutaneous coronary angioplasty 2007; March 2011   [employer]' EMS 3.0 mm 20 mm postdilated 3.6 mm in early mid LAD; status post ISR Cutting Balloon PTCA and March 11 along with PCI of distal mid lesion with a 3.0 mm 12 mm MultiLink vision BMS; the proximal stent causes jailing of SP1 and SP2 with ostial 70-80% lesions  . Cancer (HCC)    Melanoma, Squamous cell Carcinoma  . CHF (congestive heart failure) ([place])   . Chronic kidney disease    stage 2   . Dementia    mild  . Diabetes mellitus type 2 with neurological manifestations ([place])   . Dyslipidemia, goal  LDL below 70   . Full dentures   . Genetic testing 12/03/2016   Germline genetic testing was performed through [facility]'s Common Hereditary Cancers Panel + [facility]'s Melanoma Panel. This custom panel includes analysis of the following 51 genes: APC, ATM, AXIN2, BAP1, BARD1, BMPR1A, BRCA1, BRCA2, BRIP1, CDH1, CDK4, CDKN2A, CHEK2, CTNNA1, DICER1, EPCAM, GREM1, HOXB13, KIT, MEN1, MITF, MLH1, MSH2, MSH3, MSH6, MUTYH, NBN, NF1, NTHL1, PALB2, PDGFRA, PMS2, POLD1, POL  . GERD (gastroesophageal reflux disease)   . Headache(784.0)   . History of hematuria    Followed by Dr. Gaynelle Arabian  . Hypertension, essential, benign   . Nausea & vomiting 10/2015  . Peripheral neuropathy   . Stroke ([place])   . TIA (transient ischemic attack)    multiple in the past  . Wears glasses     Family History  Problem Relation Age of Onset  . Diabetes Mother   . Hypertension Mother   . Kidney disease Mother   . Hypertension Father   . Emphysema Father   . Heart attack Father   . Heart disease Father   . Arthritis Sister   . Diabetes Sister   . Hypertension Sister   . Breast cancer Sister 50       treated with mastectomy  . Cervical cancer Sister 36  . Hypertension Brother   . Hyperlipidemia Brother   . Diabetes Brother   . Stroke Brother   . Diabetes Sister   . Hypertension Sister   . Stomach cancer Sister 33  . Hypertension Brother   . ALS Brother        d.62s  . Breast cancer Daughter 16       triple negative treated with lumpectomy, chemo, radiation  . Heart attack Daughter 39       d.45  . COPD Daughter   . Cancer Paternal Aunt        unspecified type  . Cancer Paternal Uncle        unspecified type  . Cancer Paternal Uncle        unspecified type    Past Surgical History:  Procedure Laterality Date  . ABDOMINAL HYSTERECTOMY    . APPENDECTOMY    . BLADDER SUSPENSION    . BREAST LUMPECTOMY Bilateral 2018  . BREAST LUMPECTOMY WITH RADIOACTIVE SEED AND SENTINEL LYMPH NODE BIOPSY Bilateral  12/14/2016   Procedure: BILATERAL BREAST LUMPECTOMY WITH BILATERAL  RADIOACTIVE SEED AND BILATERAL AXILLARY  SENTINEL LYMPH NODE BIOPSY ERAS PATHWAY;  Surgeon: Alphonsa Overall, MD;  Location: [place];  Service: General;  Laterality: Bilateral;  PECTORAL BLOCK  . CARDIAC CATHETERIZATION  02/24/2006   85% stenosis in the proximal portion of LAD-arrangements made for PCI on 02/25/2006  . CARDIAC CATHETERIZATION  02/25/2006   80% LAD lesion stented with a 3x55m Liberte stent resulting in reduction  of 80% lesion to 0% residual  . CARDIAC CATHETERIZATION  04/26/2006   Medical management  . CARDIAC CATHETERIZATION  06/24/2009   60-70% re-stenosis in the proximal LAD. A 3.25x15 cutting balloon, 3 inflations - 14atm-38sec, 13atm-39sec, and 12atm-40sec reduced to less than 10%. 60% stenosis of the mid/distal LAD stented with a 3x32m Multilink stent.  .Marland KitchenCARDIAC CATHETERIZATION  07/09/2009   Medical management  . CAROTID DOPPLER  06/24/2009   40-59% R ICA stenosis. No significant ICA stenosis noted  . CHOLECYSTECTOMY    . DILATION AND CURETTAGE OF UTERUS    . JOINT REPLACEMENT Left   . LESION REMOVAL Right 03/11/2015   Procedure:  EXCISION OF RIGHT PRE TIBIAL LESION;  Surgeon: JJudeth Horn MD;  [place]  Service: General;  Laterality: Right;  . MASS EXCISION Left 06/10/2014   Procedure: EXCISION LEFT LOWER LEG LESION;  Surgeon: JDoreen Salvage MD;  Location[place] [facility]  Service: General;  Laterality: Left;  .Marland KitchenMASS EXCISION Left 09/05/2015   Procedure: EXCISION OF LEFT FOREARM  MASS;  Surgeon: JJudeth Horn MD;  [place] [facility]  Service: General;  Laterality: Left;  . NM MYOVIEW LTD  11/16/2011   5 beats a PVC during recovery.  No skin or infarction.  Reached 5 METs, EKG negative for ischemia   . NM MYOVIEW LTD  10/2011   No ischemia or infarction.  Normal EF.  LOW RISK. (Short bursts of 5 beats NSVT during recovery otherwise normal)  . SHOULDER  ARTHROSCOPY  10/15  . SHOULDER ARTHROSCOPY  1995   left  . TEE WITHOUT CARDIOVERSION N/A 08/03/2017   Procedure: TRANSESOPHAGEAL ECHOCARDIOGRAM (TEE);  Surgeon: RSkeet Latch MD;  [place] [facility]  Service: Cardiovascular;; EF 60-65% with no wall motion normalities.  No atrial thrombus noted.  Lightly findings consistent with a lipomatous hypertrophy of the atrial septum.   . TENDON REPAIR Left 05/12/2016   Procedure: Left Anterior Tibial Tendon Reconstruction;  Surgeon: MNewt Minion MD;  Location: [facility]  Service: Orthopedics;  Laterality: Left;  . TOTAL KNEE ARTHROPLASTY  2005   left  . TRANSTHORACIC ECHOCARDIOGRAM  10/2015   EF 55-60 %. Mild LVH. Mild pulmonary hypertension. Normal valves.  . TRANSTHORACIC ECHOCARDIOGRAM  07/2017   Vigorous LV function.  EF 65-70%.  Mild diastolic dysfunction with no RWMA. GR 1 DD.  Mild aortic sclerosis/no stenosis.  Questionable right atrial mass discounted with TEE)   . WRIST ARTHROPLASTY  2010   cancer lt wrist   Social History   Occupational History  . Occupation: retired    EFish farm manager RETIRED  Tobacco Use  . Smoking status: Never Smoker  . Smokeless tobacco: Never Used  Substance and Sexual Activity  . Alcohol use: No  . Drug use: No  . Sexual activity: Never

## 2017-11-25 ENCOUNTER — Telehealth (INDEPENDENT_AMBULATORY_CARE_PROVIDER_SITE_OTHER): Payer: Self-pay

## 2017-11-25 ENCOUNTER — Encounter (INDEPENDENT_AMBULATORY_CARE_PROVIDER_SITE_OTHER): Payer: Self-pay | Admitting: Physician Assistant

## 2017-11-25 LAB — EXTRA LAV TOP TUBE

## 2017-11-25 LAB — URIC ACID: Uric Acid, Serum: 14.4 mg/dL — ABNORMAL HIGH (ref 2.5–7.0)

## 2017-11-25 MED ORDER — ALLOPURINOL 100 MG PO TABS: 100.0000 mg | ORAL_TABLET | Freq: Every day | ORAL | 0 refills | Status: DC

## 2017-11-25 NOTE — Telephone Encounter (Signed)
[facility] called with critical labs for patient:  Uric Acid: 14.4  Please advise

## 2017-11-25 NOTE — Telephone Encounter (Signed)
I tried to call pt. , no answer. She already knows she has gout. Already on Tx, .

## 2017-11-25 NOTE — Progress Notes (Signed)
Office Visit Note   Patient: [name]           Date of Birth: 1940-05-26           MRN: [id number] Visit Date: 11/24/2017              Requested by: Redmond School, [place], [place] [address] PCP: Redmond School, MD   Assessment & Plan: Visit Diagnoses:  1. Bilateral foot pain   2. Heel cord tightness, left   3. Chronic idiopathic gout involving toe of left foot without tophus     Plan: We will have the patient take colchicine 0.6 mg twice daily for next 3 days, then resume allopurinol 100 mg but only once daily given her significant renal disease.  [facility]  She should continue her compression stockings bilateral lower extremities except for bathing or showering.  She will follow-up here in 4 weeks.  Follow-Up Instructions: Return in about 4 weeks (around 12/22/2017).   Orders:  Orders Placed This Encounter  Procedures  . XR Foot Complete Left  . XR Foot Complete Right  . Uric acid  . EXTRA LAV TOP TUBE   No orders of the defined types were placed in this encounter.     Procedures: No procedures performed   Clinical Data: No additional findings.   Subjective: Chief Complaint  Patient presents with  . Right Foot - Pain  . 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  She comes in today with her husband.  Her last uric acid level was noted to be 7.9.  We did recheck her uric acid level today and results are pending.  She reports that she has been thrown off a lot over the past several months.  She was hospitalized with heart failure and diagnosed with stage IV chronic kidney disease and is continuing to have difficulties with fluid balance.  He does have peripheral neuropathy as well in the stocking distribution and from her diabetes and is now on insulin.  She normally  wears compression stockings at home did not wear them today as she knew we would want to check her legs.  She reports intermittent episodes of redness about her great toes and second toes as well as her ankle at times.  She reports that with everything she has been through she is just been having a lot of difficulty ambulating and feels the pain in her feet makes this significantly worse.  She has limited ability to ambulate and stand to do household tasks due to the discomfort.  She had been on allopurinol to suppress her suspected gout but is currently out of allopurinol.  She has not recently taken any colchicine.  She is on gabapentin for neuropathic pain.  She feels like the right foot is worse than the left foot at times. Review of Systems   Objective: Vital Signs: There were no vitals taken for this visit.  Physical Exam Patient is a elderly female who is alert oriented and appropriate.  She is experiencing some emotional distress and frustration with her lack of mobility and stamina following her recent hospitalization for heart failure.  She has limited ability to ambulate and reports only being able to stand for short periods at home. Ortho Exam Examination of bilateral feet show some redness about the great toe  and tenderness to palpation.  He also has tenderness about the right lateral foot and ankle.  She has pitting edema of both feet and ankles.  She has no ulcerations.  She has good pedal pulses bilaterally.  She was in a wheelchair today and we did not have her ambulate. Specialty Comments:  No specialty comments available.  Imaging: No results found.   PMFS History: Patient Active Problem List   Diagnosis Date Noted  . Right atrial mass   . Chronic diastolic heart failure ([place]) 08/01/2017  . Genetic testing 12/03/2016  . 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 Malignant neoplasm of upper-outer quadrant of left breast in female, estrogen receptor positive ([place]) 11/19/2016  .  Chronic idiopathic gout involving toe of left foot without tophus 11/15/2016  . Malignant 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, estrogen receptor positive ([place]) 11/09/2016  . Tendon tear, ankle, left, sequela   . Traumatic rupture of left anterior tibial tendon 04/20/2016  . Acute on chronic kidney failure ([place])   . 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 Headache, migraine   . Unexplained weight loss 11/17/2015  . Acute kidney injury superimposed on chronic kidney disease ([place]) 11/17/2015  . Restless legs syndrome 07/24/2015  . Mild cognitive impairment 01/08/2015  . Abnormality of gait 01/08/2015  . Dizziness 12/19/2014  . Preoperative cardiovascular examination 12/11/2013  . GERD (gastroesophageal reflux disease) 10/06/2013  . Syncope 10/05/2013  . Dementia 12/06/2012  . Anxiety 10/28/2012    Class: Acute  . Bilateral lower extremity edema 10/28/2012  . CAD S/P percutaneous coronary angioplasty   . Essential hypertension   . Dyslipidemia, goal LDL below 70    Past Medical History:  Diagnosis Date  . Anxiety   . Arthritis     osteoarthritis  . Breast cancer ([place])   . Breast cancer in female [facility])    Bilateral  . CAD S/P percutaneous coronary angioplasty 2007; March 2011   [employer] 3.0 mm 20 mm postdilated 3.6 mm in early mid LAD; status post ISR Cutting Balloon PTCA and March 11 along with PCI of distal mid lesion with a 3.0 mm 12 mm MultiLink vision BMS; the proximal stent causes jailing of SP1 and SP2 with ostial 70-80% lesions  . Cancer (HCC)    Melanoma, Squamous cell Carcinoma  . CHF (congestive heart failure) ([place])   . Chronic kidney disease    stage 2   . Dementia    mild  . Diabetes mellitus type 2 with neurological manifestations ([place])   . Dyslipidemia, goal LDL below 70   . Full dentures   . Genetic testing 12/03/2016   Germline genetic testing was performed through [employer] Common Hereditary Cancers Panel +  [employer]'s Melanoma Panel. This custom panel includes analysis of the following 51 genes: APC, ATM, AXIN2, BAP1, BARD1, BMPR1A, BRCA1, BRCA2, BRIP1, CDH1, CDK4, CDKN2A, CHEK2, CTNNA1, DICER1, EPCAM, GREM1, HOXB13, KIT, MEN1, MITF, MLH1, MSH2, MSH3, MSH6, MUTYH, NBN, NF1, NTHL1, PALB2, PDGFRA, PMS2, POLD1, POL  . GERD (gastroesophageal reflux disease)   . Headache(784.0)   . History of hematuria    Followed by Dr. Gaynelle Arabian  . Hypertension, essential, benign   . Nausea & vomiting 10/2015  . Peripheral neuropathy   . Stroke ([place])   . TIA (transient ischemic attack)    multiple in the past  . Wears glasses     Family History  Problem Relation Age of Onset  . Diabetes Mother   . Hypertension  Mother   . Kidney disease Mother   . Hypertension Father   . Emphysema Father   . Heart attack Father   . Heart disease Father   . Arthritis Sister   . Diabetes Sister   . Hypertension Sister   . Breast cancer Sister 55       treated with mastectomy  . Cervical cancer Sister 39  . Hypertension Brother   . Hyperlipidemia Brother   . Diabetes Brother   . Stroke Brother   . Diabetes Sister   . Hypertension Sister   . Stomach cancer Sister 58  . Hypertension Brother   . ALS Brother        d.62s  . Breast cancer Daughter 57       triple negative treated with lumpectomy, chemo, radiation  . Heart attack Daughter 59       d.45  . COPD Daughter   . Cancer Paternal Aunt        unspecified type  . Cancer Paternal Uncle        unspecified type  . Cancer Paternal Uncle        unspecified type    Past Surgical History:  Procedure Laterality Date  . ABDOMINAL HYSTERECTOMY    . APPENDECTOMY    . BLADDER SUSPENSION    . BREAST LUMPECTOMY Bilateral 2018  . BREAST LUMPECTOMY WITH RADIOACTIVE SEED AND SENTINEL LYMPH NODE BIOPSY Bilateral 12/14/2016   Procedure: BILATERAL BREAST LUMPECTOMY WITH BILATERAL  RADIOACTIVE SEED AND BILATERAL AXILLARY  SENTINEL LYMPH NODE BIOPSY ERAS PATHWAY;  Surgeon:  Alphonsa Overall, MD;  Location: [facility];  Service: General;  Laterality: Bilateral;  PECTORAL BLOCK  . CARDIAC CATHETERIZATION  02/24/2006   85% stenosis in the proximal portion of LAD-arrangements made for PCI on 02/25/2006  . CARDIAC CATHETERIZATION  02/25/2006   80% LAD lesion stented with a 3x8m Liberte stent resulting in reduction of 80% lesion to 0% residual  . CARDIAC CATHETERIZATION  04/26/2006   Medical management  . CARDIAC CATHETERIZATION  06/24/2009   60-70% re-stenosis in the proximal LAD. A 3.25x15 cutting balloon, 3 inflations - 14atm-38sec, 13atm-39sec, and 12atm-40sec reduced to less than 10%. 60% stenosis of the mid/distal LAD stented with a 3x133mMultilink stent.  . Marland KitchenARDIAC CATHETERIZATION  07/09/2009   Medical management  . CAROTID DOPPLER  06/24/2009   40-59% R ICA stenosis. No significant ICA stenosis noted  . CHOLECYSTECTOMY    . DILATION AND CURETTAGE OF UTERUS    . JOINT REPLACEMENT Left   . LESION REMOVAL Right 03/11/2015   Procedure:  EXCISION OF RIGHT PRE TIBIAL LESION;  Surgeon: JaJudeth HornMD;  [place] Service: General;  Laterality: Right;  . MASS EXCISION Left 06/10/2014   Procedure: EXCISION LEFT LOWER LEG LESION;  Surgeon: JaDoreen SalvageMD;  [place] Service: General;  Laterality: Left;  . Marland KitchenASS EXCISION Left 09/05/2015   Procedure: EXCISION OF LEFT FOREARM  MASS;  Surgeon: JaJudeth HornMD;  [place] [facility] Service: General;  Laterality: Left;  . NM MYOVIEW LTD  11/16/2011   5 beats a PVC during recovery.  No skin or infarction.  Reached 5 METs, EKG negative for ischemia   . NM MYOVIEW LTD  10/2011   No ischemia or infarction.  Normal EF.  LOW RISK. (Short bursts of 5 beats NSVT during recovery otherwise normal)  . SHOULDER ARTHROSCOPY  10/15  . SHOULDER ARTHROSCOPY  1995   left  .  TEE WITHOUT CARDIOVERSION N/A 08/03/2017   Procedure: TRANSESOPHAGEAL ECHOCARDIOGRAM (TEE);  Surgeon:  Skeet Latch, MD;  Location: [facility];  Service: Cardiovascular;; EF 60-65% with no wall motion normalities.  No atrial thrombus noted.  Lightly findings consistent with a lipomatous hypertrophy of the atrial septum.   . TENDON REPAIR Left 05/12/2016   Procedure: Left Anterior Tibial Tendon Reconstruction;  Surgeon: Newt Minion, MD;  Location: [facility];  Service: Orthopedics;  Laterality: Left;  . TOTAL KNEE ARTHROPLASTY  2005   left  . TRANSTHORACIC ECHOCARDIOGRAM  10/2015   EF 55-60 %. Mild LVH. Mild pulmonary hypertension. Normal valves.  . TRANSTHORACIC ECHOCARDIOGRAM  07/2017   Vigorous LV function.  EF 65-70%.  Mild diastolic dysfunction with no RWMA. GR 1 DD.  Mild aortic sclerosis/no stenosis.  Questionable right atrial mass discounted with TEE)   . WRIST ARTHROPLASTY  2010   cancer lt wrist   Social History   Occupational History  . Occupation: retired    Fish farm manager: RETIRED  Tobacco Use  . Smoking status: Never Smoker  . Smokeless tobacco: Never Used  Substance and Sexual Activity  . Alcohol use: No  . Drug use: No  . Sexual activity: Never

## 2017-11-29 NOTE — Telephone Encounter (Signed)
Pt is a CKD and has an uric acid of 14.4 is on allopurinol 100 mg 1 x qd any other change in treatment.

## 2017-11-30 ENCOUNTER — Encounter: Payer: Self-pay | Admitting: Cardiology

## 2017-11-30 DIAGNOSIS — H26491 Other secondary cataract, right eye: Secondary | ICD-10-CM | POA: Diagnosis not present

## 2017-11-30 DIAGNOSIS — H5213 Myopia, bilateral: Secondary | ICD-10-CM | POA: Diagnosis not present

## 2017-11-30 DIAGNOSIS — D631 Anemia in chronic kidney disease: Secondary | ICD-10-CM | POA: Diagnosis not present

## 2017-11-30 DIAGNOSIS — H524 Presbyopia: Secondary | ICD-10-CM | POA: Diagnosis not present

## 2017-11-30 DIAGNOSIS — E119 Type 2 diabetes mellitus without complications: Secondary | ICD-10-CM | POA: Diagnosis not present

## 2017-11-30 DIAGNOSIS — N184 Chronic kidney disease, stage 4 (severe): Secondary | ICD-10-CM | POA: Diagnosis not present

## 2017-11-30 DIAGNOSIS — N2581 Secondary hyperparathyroidism of renal origin: Secondary | ICD-10-CM | POA: Diagnosis not present

## 2017-11-30 DIAGNOSIS — H52223 Regular astigmatism, bilateral: Secondary | ICD-10-CM | POA: Diagnosis not present

## 2017-12-01 DIAGNOSIS — Z23 Encounter for immunization: Secondary | ICD-10-CM | POA: Diagnosis not present

## 2017-12-01 DIAGNOSIS — I10 Essential (primary) hypertension: Secondary | ICD-10-CM | POA: Diagnosis not present

## 2017-12-01 DIAGNOSIS — N312 Flaccid neuropathic bladder, not elsewhere classified: Secondary | ICD-10-CM | POA: Diagnosis not present

## 2017-12-01 DIAGNOSIS — N3020 Other chronic cystitis without hematuria: Secondary | ICD-10-CM | POA: Diagnosis not present

## 2017-12-01 DIAGNOSIS — N184 Chronic kidney disease, stage 4 (severe): Secondary | ICD-10-CM | POA: Diagnosis not present

## 2017-12-01 DIAGNOSIS — N189 Chronic kidney disease, unspecified: Secondary | ICD-10-CM | POA: Diagnosis not present

## 2017-12-01 DIAGNOSIS — E1165 Type 2 diabetes mellitus with hyperglycemia: Secondary | ICD-10-CM | POA: Diagnosis not present

## 2017-12-05 DIAGNOSIS — E876 Hypokalemia: Secondary | ICD-10-CM | POA: Diagnosis not present

## 2017-12-05 DIAGNOSIS — D631 Anemia in chronic kidney disease: Secondary | ICD-10-CM | POA: Diagnosis not present

## 2017-12-05 DIAGNOSIS — N184 Chronic kidney disease, stage 4 (severe): Secondary | ICD-10-CM | POA: Diagnosis not present

## 2017-12-05 DIAGNOSIS — N2581 Secondary hyperparathyroidism of renal origin: Secondary | ICD-10-CM | POA: Diagnosis not present

## 2017-12-05 DIAGNOSIS — I129 Hypertensive chronic kidney disease with stage 1 through stage 4 chronic kidney disease, or unspecified chronic kidney disease: Secondary | ICD-10-CM | POA: Diagnosis not present

## 2017-12-07 ENCOUNTER — Telehealth: Payer: Self-pay | Admitting: Cardiology

## 2017-12-07 NOTE — Telephone Encounter (Signed)
Received records from [facility] on 12/07/17, Appt 01/02/18 @ 8:00AM. [place]

## 2017-12-09 DIAGNOSIS — E876 Hypokalemia: Secondary | ICD-10-CM | POA: Diagnosis not present

## 2017-12-13 ENCOUNTER — Other Ambulatory Visit: Payer: Self-pay | Admitting: *Deleted

## 2017-12-13 DIAGNOSIS — E876 Hypokalemia: Secondary | ICD-10-CM | POA: Diagnosis not present

## 2017-12-13 NOTE — Patient Outreach (Signed)
[facility]) Care Management   12/13/2017  [name] [birth year] [id number]  [name] is an 77 y.o. female  Subjective:  Complaint of not sleeping due to left hip pain . Patient discussed that she has already been up and mopped floor this morning .   Patient discussed recent problem with low potassium reports received call from renal office Saturday reports level in 2 range and instructions to increase to potassium 2 twice daily and have lab check on today.   Patient discussed being happy that swelling in her legs has decreased .  Patient voiced concern regarding her blood sugar being all over the place, occasional reading of 280. Patient discussed being frustrated by her A1c now being 7 when it was 5.4 in May. Patient discussed recent adjustment insulin .     Objective:  BP 122/78 (BP Location: Right Arm, Patient Position: Sitting, Cuff Size: Normal)   Pulse (!) 105   Resp 18   Wt 177 lb (80.3 kg)   SpO2 97%   BMI 28.57 kg/m  Review of Systems  Constitutional: Negative.   HENT: Negative.   Eyes: Negative.   Respiratory: Negative.   Cardiovascular: Positive for leg swelling.  Gastrointestinal: Negative.   Genitourinary: Negative.   Musculoskeletal: Positive for joint pain.  Skin: Negative.   Neurological: Negative.   Endo/Heme/Allergies: Negative.   Psychiatric/Behavioral: Negative.     Physical Exam  Constitutional: She is oriented to person, place, and time. She appears well-developed and well-nourished.  Cardiovascular: Normal rate and normal heart sounds.  Respiratory: Effort normal and breath sounds normal.  GI: Soft.  Neurological: She is alert and oriented to person, place, and time.  Skin: Skin is warm and dry.  Psychiatric: She has a normal mood and affect. Her behavior is normal. Judgment and thought content normal.    Encounter Medications:   Outpatient Encounter Medications as of 12/13/2017  Medication Sig Note  . allopurinol  (ZYLOPRIM) 100 MG tablet Take 1 tablet (100 mg total) by mouth daily.   . bumetanide (BUMEX) 2 MG tablet Take 2 mg by mouth daily. Patient currently taking 2 tablets in am and 2 in pm   . Calcium Carb-Cholecalciferol (CALCIUM 600 + D PO) Take 1 tablet by mouth at bedtime.    . Cholecalciferol 2000 units CAPS Take 2,000 Units by mouth daily.   . ciprofloxacin (CIPRO) 500 MG tablet completes today 09/01/17   . clopidogrel (PLAVIX) 75 MG tablet Take 1 tablet (75 mg total) by mouth daily.   . COD LIVER OIL PO Take 415 mg by mouth daily.   Marland Kitchen CRANBERRY PO Take 1 capsule by mouth daily.   . diazepam (VALIUM) 5 MG tablet Take 5 mg by mouth daily as needed for anxiety.   . diphenhydrAMINE (BENADRYL) 50 MG capsule Take 50 mg by mouth at bedtime as needed for sleep.    Marland Kitchen exemestane (AROMASIN) 25 MG tablet Take 1 tablet (25 mg total) daily after breakfast by mouth.   . Fish Oil-Krill Oil (KRILL OIL PLUS PO) Take 1 capsule by mouth daily.    Marland Kitchen gabapentin (NEURONTIN) 300 MG capsule Take 1 capsule (300 mg total) by mouth 2 (two) times daily. Take 1 during the day if needed and 2 at night 11/15/2017: Taking 2 at bedtime only   . insulin NPH Human (HUMULIN N,NOVOLIN N) 100 UNIT/ML injection Inject 15-18 Units into the skin 2 (two) times daily before a meal. 18 units in the am and 15  units in the pm   . Iron-FA-B Cmp-C-Biot-Probiotic (FUSION PLUS) CAPS takes 1 tablet daily   . Melatonin 5 MG CAPS Take 5 mg by mouth at bedtime as needed (sleep).    . metFORMIN (GLUCOPHAGE) 500 MG tablet Take by mouth 2 (two) times daily with a meal.   . Methylfol-Methylcob-Acetylcyst (CEREFOLIN NAC) 6-2-600 MG TABS TAKE 1 CAPLET BY MOUTH ONCE DAILY. 09/01/2017: 09/01/17 must come from [facility]  . metolazone (ZAROXOLYN) 2.5 MG tablet Take 30 min before the morning dose of bumex as directed daily. 10/05/2017: Patient reports taking Monday, Wednesday and Friday   . metoprolol succinate (TOPROL XL) 50 MG 24 hr tablet TAKE ONE-HALF (1/2)  TABLET DAILY   . mirtazapine (REMERON) 15 MG tablet Take 15 mg by mouth at bedtime. 11/2 tablet at bedtime   . pantoprazole (PROTONIX) 40 MG tablet TAKE 1 TABLET DAILY (CONTACT OFFICE FOR ADDITIONAL REFILLS, FINAL ATTEMPT)   . potassium chloride SA (K-DUR,KLOR-CON) 20 MEQ tablet Take 20 mEq by mouth daily.   . pramipexole (MIRAPEX) 0.25 MG tablet TAKE 1 TABLET AT 4:00 P.M. AND 2 TABLETS (0.5 MG) AT BEDTIME   . Resveratrol (RESERVAPAK PO) Take 1 tablet by mouth every evening.    . rosuvastatin (CRESTOR) 20 MG tablet Take 20 mg by mouth at bedtime.   Marland Kitchen Ubiquinol (QUNOL COQ10/UBIQUINOL/MEGA) 100 MG CAPS Take 1 tablet by mouth daily. CoQ10, Qunol    No facility-administered encounter medications on file as of 12/13/2017.     Functional Status:   In your present state of health, do you have any difficulty performing the following activities: 08/16/2017 08/02/2017  Hearing? N N  Vision? N N  Difficulty concentrating or making decisions? N N  Walking or climbing stairs? Y Y  Dressing or bathing? N N  Doing errands, shopping? N N  Comment husband helps -  Conservation officer, nature and eating ? N -  Using the Toilet? N -  In the past six months, have you accidently leaked urine? N -  Do you have problems with loss of bowel control? N -  Managing your Medications? Y -  Comment husband helps  -  Managing your Finances? N -  Housekeeping or managing your Housekeeping? N -  Some recent data might be hidden    Fall/Depression Screening:    Fall Risk  09/01/2017 08/23/2017 08/15/2017  Falls in the past year? Yes Yes Yes  Number falls in past yr: 2 or more 1 1  Injury with Fall? No No -  Risk for fall due to : Impaired balance/gait Impaired balance/gait;History of fall(s) History of fall(s);Impaired balance/gait;Impaired mobility  Follow up - Falls evaluation completed Falls evaluation completed   PHQ 2/9 Scores 08/23/2017 03/21/2017  PHQ - 2 Score 1 1    Assessment:  Routine home visit , patient husband  present .   Heart Failure - decrease swelling in legs able to wears shoes without problems, she continues to wear support hose. .Monitoring weights, daily , today's reading is 177, no sudden weight increases, recent changes in bumex by nephrologist.   Diabetes- 7 day average  Is 174, today's reading 155,   blood sugar range has been 115- 270, no low blood sugar readings. Husband assisting patient with insulin injections . Patient discussed making changes in diet, working on trying to eat meals throughout the day.    Chronic kidney disease - consistent follow up with nephrology.  Blood pressure - records reviewed remains in the within normal ranges, fewer episodes  of low blood pressure improved after rest.   Patients' husband keeps a meticulous record of patient weights, blood pressures, blood sugars, helps with managing medication and all medical appointments.   Plan:  Will plan follow up visit with patient in the next month.  Encouraged patient to notify MD of unresolved pain of left hip .  Will send PCP visit note, involvement letter for quarterly update.    [name], RN, [place] Management Coordinator  [phone]- Mobile [phone]- Toll Free Main Office

## 2017-12-14 ENCOUNTER — Other Ambulatory Visit (HOSPITAL_COMMUNITY): Payer: Self-pay

## 2017-12-15 ENCOUNTER — Ambulatory Visit (HOSPITAL_COMMUNITY)
Admission: RE | Admit: 2017-12-15 | Discharge: 2017-12-15 | Disposition: A | Discharge status: Home / Self Care | Payer: Medicare Other | Source: Ambulatory Visit | Attending: Nephrology | Admitting: Nephrology

## 2017-12-15 DIAGNOSIS — D631 Anemia in chronic kidney disease: Secondary | ICD-10-CM | POA: Insufficient documentation

## 2017-12-15 DIAGNOSIS — N189 Chronic kidney disease, unspecified: Secondary | ICD-10-CM | POA: Diagnosis not present

## 2017-12-15 DIAGNOSIS — M7062 Trochanteric bursitis, left hip: Secondary | ICD-10-CM | POA: Diagnosis not present

## 2017-12-15 DIAGNOSIS — M545 Low back pain: Secondary | ICD-10-CM | POA: Diagnosis not present

## 2017-12-15 MED ORDER — SODIUM CHLORIDE 0.9 % IV SOLN
510.0000 mg | INTRAVENOUS | Status: DC
  Administered 2017-12-15: 510 mg via INTRAVENOUS
  Filled 2017-12-15: qty 17

## 2017-12-15 NOTE — Discharge Instructions (Signed)

## 2017-12-16 DIAGNOSIS — E876 Hypokalemia: Secondary | ICD-10-CM | POA: Diagnosis not present

## 2017-12-22 ENCOUNTER — Encounter (INDEPENDENT_AMBULATORY_CARE_PROVIDER_SITE_OTHER): Payer: Self-pay | Admitting: Physician Assistant

## 2017-12-22 ENCOUNTER — Ambulatory Visit (HOSPITAL_COMMUNITY)
Admission: RE | Admit: 2017-12-22 | Discharge: 2017-12-22 | Disposition: A | Discharge status: Home / Self Care | Payer: Medicare Other | Source: Ambulatory Visit | Attending: Nephrology | Admitting: Nephrology

## 2017-12-22 ENCOUNTER — Ambulatory Visit (INDEPENDENT_AMBULATORY_CARE_PROVIDER_SITE_OTHER): Payer: Medicare Other | Admitting: Physician Assistant

## 2017-12-22 VITALS — Ht 66.5 in | Wt 179.0 lb

## 2017-12-22 DIAGNOSIS — Z981 Arthrodesis status: Secondary | ICD-10-CM

## 2017-12-22 DIAGNOSIS — N189 Chronic kidney disease, unspecified: Secondary | ICD-10-CM | POA: Diagnosis not present

## 2017-12-22 DIAGNOSIS — D631 Anemia in chronic kidney disease: Secondary | ICD-10-CM | POA: Diagnosis not present

## 2017-12-22 DIAGNOSIS — M1A0720 Idiopathic chronic gout, left ankle and foot, without tophus (tophi): Secondary | ICD-10-CM

## 2017-12-22 DIAGNOSIS — E1142 Type 2 diabetes mellitus with diabetic polyneuropathy: Secondary | ICD-10-CM

## 2017-12-22 DIAGNOSIS — S86212S Strain of muscle(s) and tendon(s) of anterior muscle group at lower leg level, left leg, sequela: Secondary | ICD-10-CM | POA: Diagnosis not present

## 2017-12-22 IMAGING — CT CT HEAD W/O CM
4 series · 17 of 47 positions shown, 19 images · non-contrast
Comparison: 11/28/2015 head CT.

CLINICAL DATA: Altered mental status. Disorientation. Fall with
head injury.

EXAM:
CT HEAD WITHOUT CONTRAST
TECHNIQUE: Contiguous axial images were obtained from the base of the skull
through the vertex without intravenous contrast.

[Series 2: head trauma wo · axial · 0.41mm/px · z∈[+1458,+1563]mm · 7 of 29 slices shown, 9 images]
[im 4/29  brain]
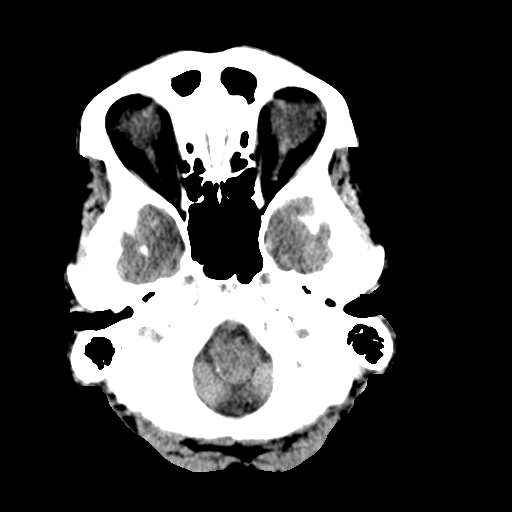
[im 4/29  bone]
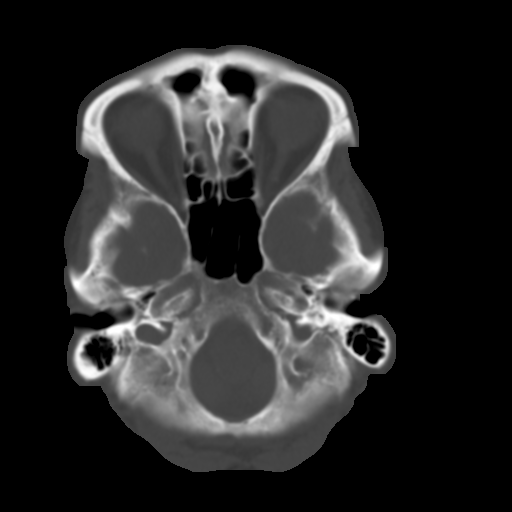
[im 8/29  brain]
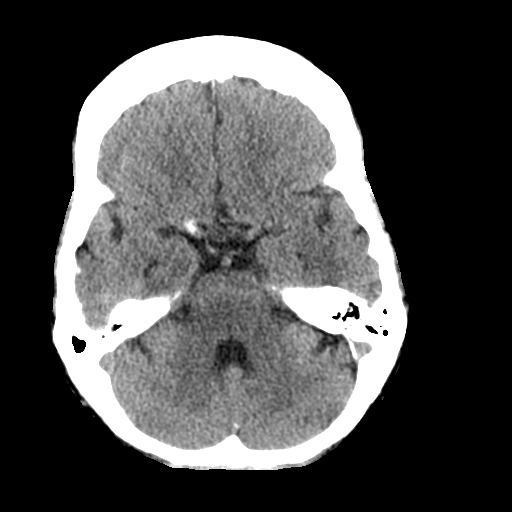
[im 11/29  brain]
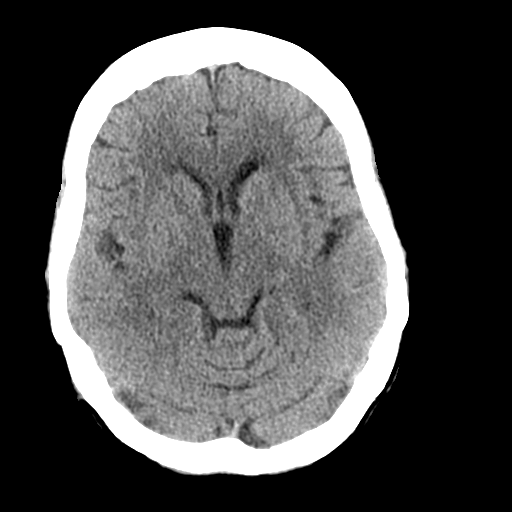
[im 15/29  brain]
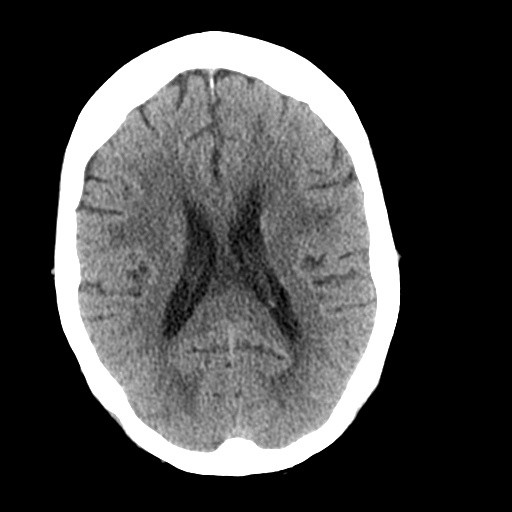
[im 18/29  brain]
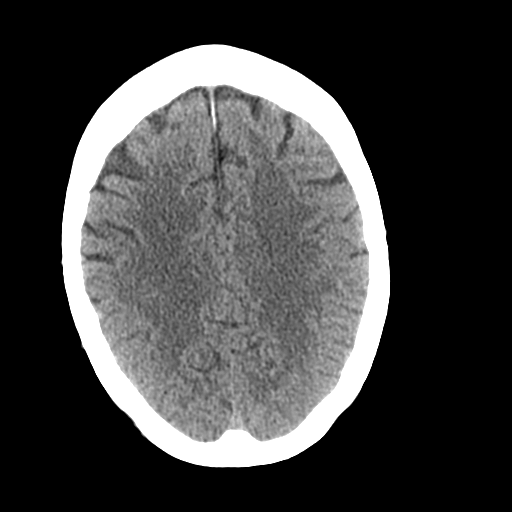
[im 18/29  bone]
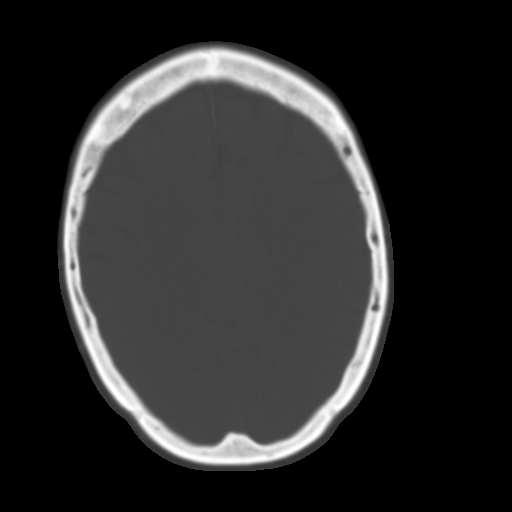
[im 22/29  brain]
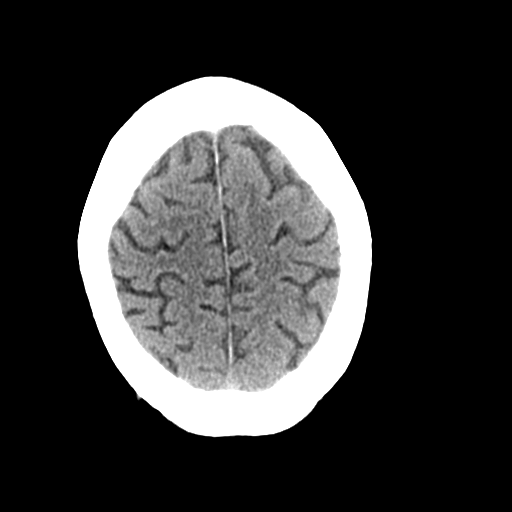
[im 25/29  brain]
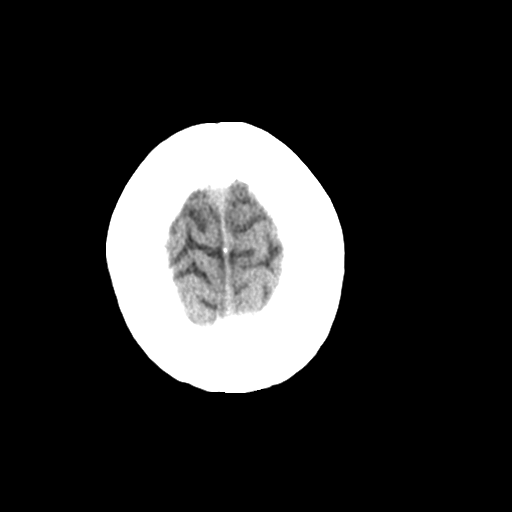

[Series 3: head bone · axial · 0.41mm/px · z∈[+1457,+1505]mm · 4 of 72 slices shown]
[im 8/72  bone]
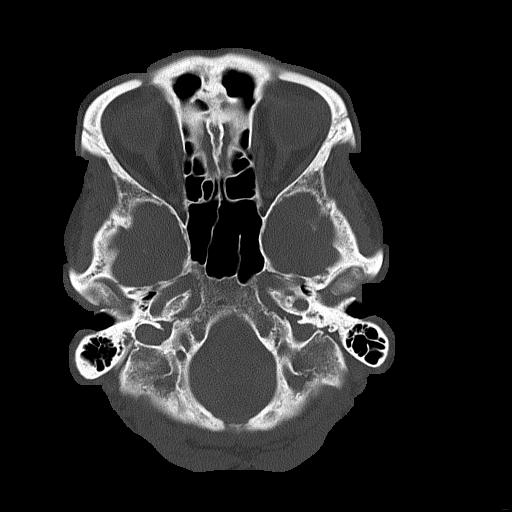
[im 15/72  bone]
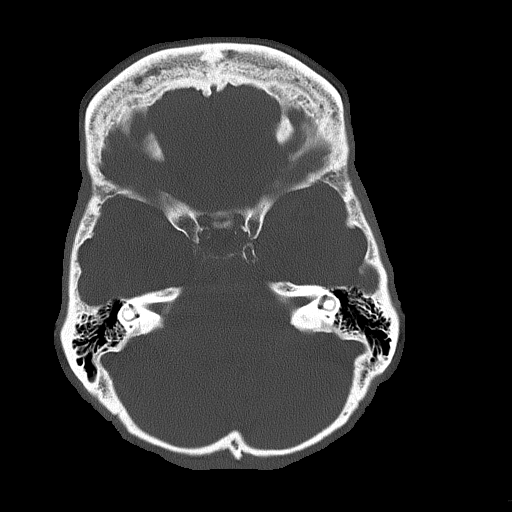
[im 22/72  bone]
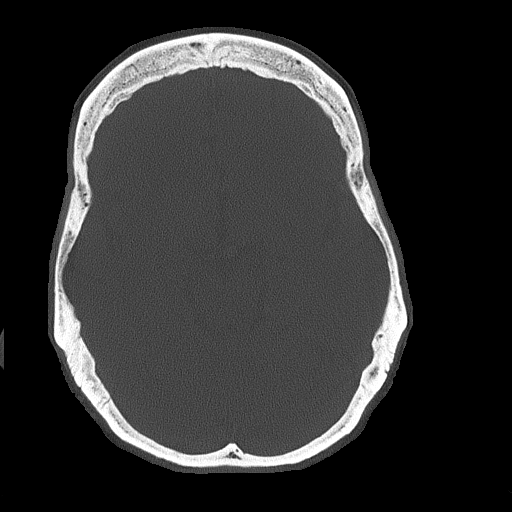
[im 32/72  bone]
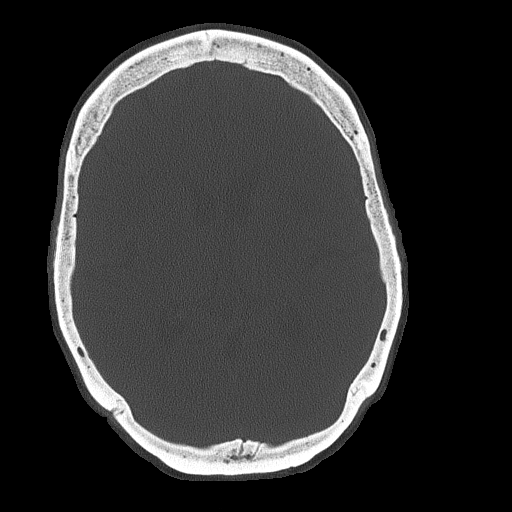

[Series 4: coronal soft tissue · coronal · 0.30mm/px · 3 of 67 slices shown]
[im 23/67  brain]
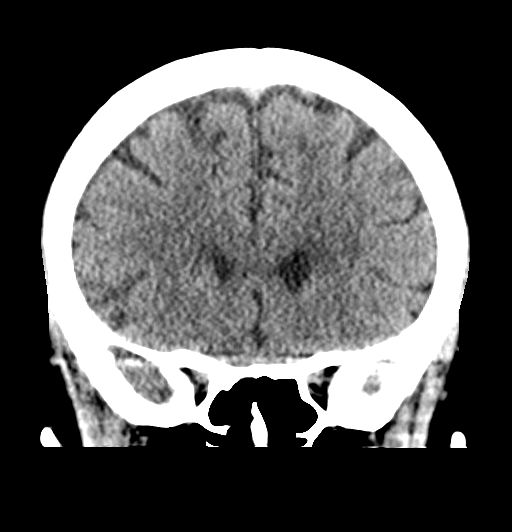
[im 30/67  brain]
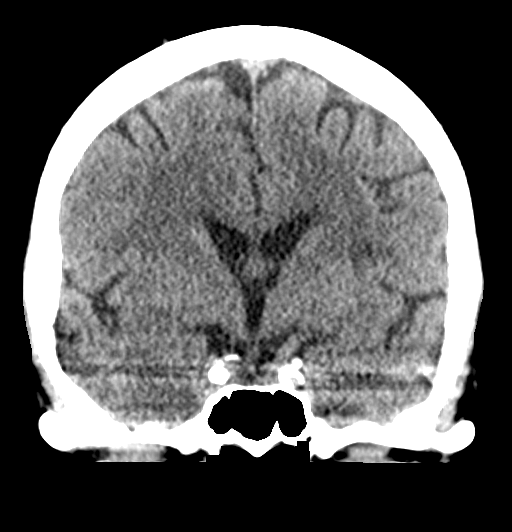
[im 37/67  brain]
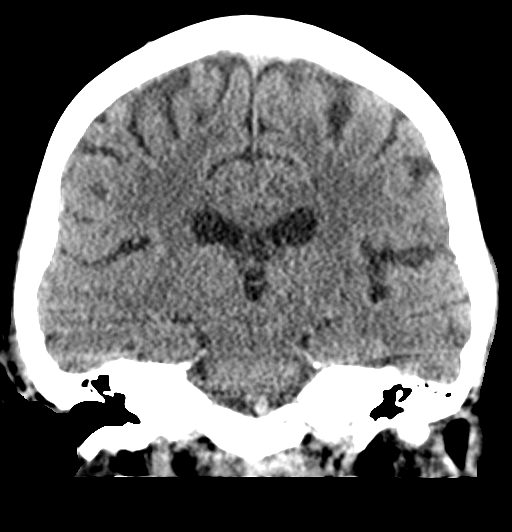

[Series 5: sagittal soft tissue · sagittal · 0.33mm/px · 3 of 52 slices shown]
[im 18/52  brain]
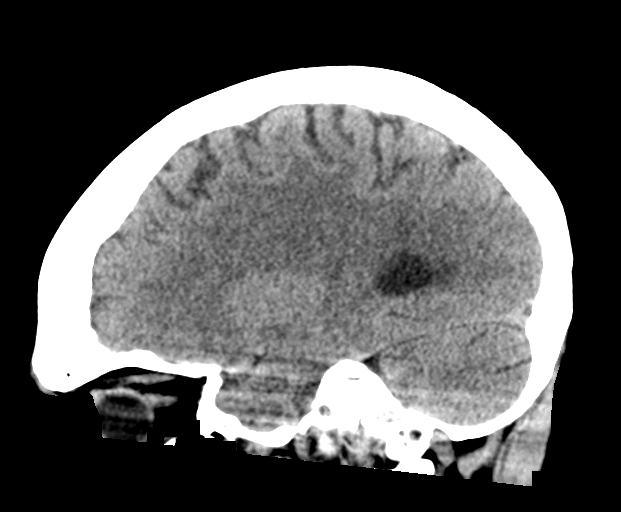
[im 26/52  brain]
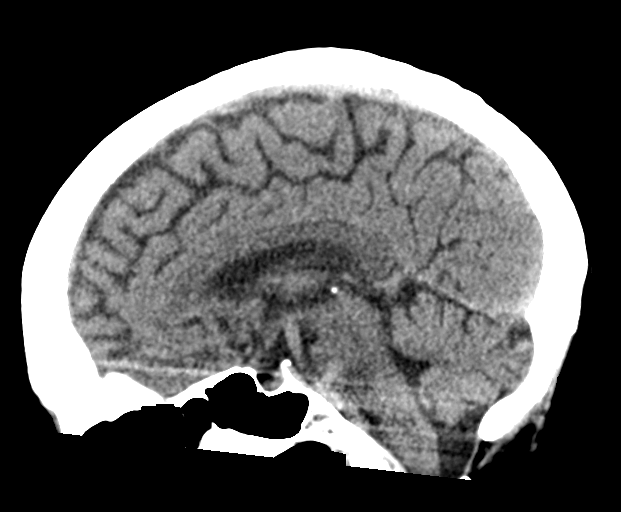
[im 35/52  brain]
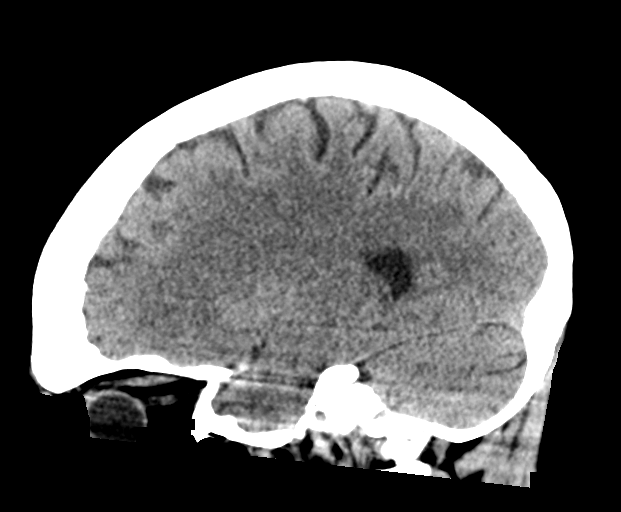

[17 of 47 positions shown; findings below may reference images not displayed]

FINDINGS: Brain: No evidence of parenchymal hemorrhage or extra-axial fluid
collection. No mass lesion, mass effect, or midline shift. No CT
evidence of acute infarction. Nonspecific mild subcortical and
periventricular white matter hypodensity, most in keeping with
chronic small vessel ischemic change. Cerebral volume is age
appropriate. No ventriculomegaly.

Vascular: Intracranial atherosclerosis.  No acute abnormality.

Skull: No evidence of calvarial fracture.

Sinuses/Orbits: The visualized paranasal sinuses are essentially
clear.

Other:  The mastoid air cells are unopacified.
IMPRESSION: 1. No evidence of acute intracranial abnormality. No evidence of
calvarial fracture.
2. Mild chronic small vessel ischemia.

## 2017-12-22 MED ORDER — SODIUM CHLORIDE 0.9 % IV SOLN
510.0000 mg | INTRAVENOUS | Status: DC
  Administered 2017-12-22: 510 mg via INTRAVENOUS
  Filled 2017-12-22: qty 17

## 2017-12-23 ENCOUNTER — Encounter (INDEPENDENT_AMBULATORY_CARE_PROVIDER_SITE_OTHER): Payer: Self-pay | Admitting: Physician Assistant

## 2017-12-23 LAB — URIC ACID: Uric Acid, Serum: 8.7 mg/dL — ABNORMAL HIGH (ref 2.5–7.0)

## 2017-12-23 LAB — EXTRA LAV TOP TUBE

## 2017-12-23 MED ORDER — COLCHICINE 0.6 MG PO TABS: 0.6000 mg | ORAL_TABLET | Freq: Every day | ORAL | 1 refills | Status: DC

## 2017-12-23 NOTE — Progress Notes (Signed)
Office Visit Note   Patient: [name]           Date of Birth: 17-Aug-1940           MRN: [id number] Visit Date: 12/22/2017              Requested by: [facility] [address], [place] [place], [place] [address] PCP: Redmond School, MD  Chief Complaint  Patient presents with  . Right Foot - Pain, Follow-up      HPI: Patient is a 77 year old female here with her husband who presents for follow-up for bilateral foot pain.  She has more pain on the right foot today.  She went to see [name] last week with some complaints of hip pain and may have had trochanteric bursitis as she received a steroid shot into the lateral aspect of the left hip.  She does report following this her blood sugars did get up over 400 but they are much better nail.  She has multiple issues going on including type 2 diabetes mellitus, and she was recently taken off of metformin due to worsening of her chronic kidney disease and started on insulin injections.  She reports that her blood sugar has been quotation all over the place" following this change.  Her previous uric acid prior to this visit was 14.4 and she had been started on allopurinol 100 mg daily, a lower dose due to her kidney disease.  She is previously taken some colchicine on an intermittent basis due to acute flares of her gout particularly in her feet which appears to be a major limiting factor with her mobility at this point.  We discussed that she can only take brief courses of colchicine to try to dampen down the symptoms and then continue on her allopurinol.  We did recheck an uric acid level this visit level this visit, and it was improved to 8.7.  She reports that she went to the [facility] and has orthotics and shoes on order and is going back there tomorrow to obtain the shoes.  We will see how she does with these orthotics.  She does report that she is feeling like she is walking on the inside of her right foot in particular nail.  She  is on gabapentin to 300 mg tablets at bedtime according to the husband but still has significant pain control issues.  Assessment & Plan: Visit Diagnoses:  1. Idiopathic chronic gout of left foot without tophus   2. Traumatic rupture of left anterior tibial tendon, sequela   3. H/O ankle fusion   4. Type 2 diabetes mellitus with diabetic polyneuropathy, unspecified whether long term insulin use ([place])   5. Chronic kidney disease, unspecified CKD stage     Plan: Recommend she continue on allopurinol 100 mg daily.  We did reorder some colchicine for the patient to use on a brief, i.e. 3-day course as needed for acute flares.  She is going to go ahead and follow-up with the [facility] for her orthotics and shoes but if she continues to have difficulty with walking or feeling like she is walking on the inside of her right foot in particular we may need to order custom shoes, orthotics and bracing to improve this.  She will follow-up here in 4 weeks.  Follow-Up Instructions: Return in about 4 weeks (around 01/19/2018).   Ortho Exam  Patient is alert, oriented, no adenopathy, well-dressed, tearful affect, normal respiratory effort. Patient is an elderly  female who is somewhat distraught over continued foot pain.  She is tearful during the exam.  She ambulates with an antalgic appearing gait especially on the right and appears to have increased pronator insufficiency with some pressure over the medial foot over the proximal metatarsal first metatarsal on the right.  She has pitting edema of both lower extremities.  She has no ulcers.  No signs of cellulitis.  No signs of acute current flare of gout.  Imaging: No results found. No images are attached to the encounter.  Labs: Lab Results  Component Value Date   HGBA1C 5.7 (H) 12/07/2016   HGBA1C 5.8 (H) 05/06/2016   HGBA1C 6.0 (H) 11/17/2015   LABURIC 8.7 (H) 12/22/2017   LABURIC 14.4 (H) 11/24/2017   LABURIC 7.9 (H) 11/11/2016    REPTSTATUS 11/18/2015 FINAL 11/17/2015   CULT (A) 11/17/2015    50,000 COLONIES/mL LACTOBACILLUS SPECIES Standardized susceptibility testing for this organism is not available.    LABORGA ESCHERICHIA COLI 12/31/2009     Lab Results  Component Value Date   ALBUMIN 3.8 08/15/2017   ALBUMIN 4.7 08/01/2017   ALBUMIN 3.7 04/12/2017   LABURIC 8.7 (H) 12/22/2017   LABURIC 14.4 (H) 11/24/2017   LABURIC 7.9 (H) 11/11/2016    Body mass index is 28.46 kg/m.  Orders:  Orders Placed This Encounter  Procedures  . Uric acid  . EXTRA LAV TOP TUBE   No orders of the defined types were placed in this encounter.    Procedures: No procedures performed  Clinical Data: No additional findings.  ROS:  All other systems negative, except as noted in the HPI. Review of Systems  Objective: Vital Signs: Ht 5' 6.5" (1.689 m)   Wt 179 lb (81.2 kg)   BMI 28.46 kg/m   Specialty Comments:  No specialty comments available.  PMFS History: Patient Active Problem List   Diagnosis Date Noted  . Right atrial mass   . Chronic diastolic heart failure ([place]) 08/01/2017  . Genetic testing 12/03/2016  . 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 Malignant neoplasm of upper-outer quadrant of left breast in female, estrogen receptor positive ([place]) 11/19/2016  . Chronic idiopathic gout involving toe of left foot without tophus 11/15/2016  . Malignant 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, estrogen receptor positive ([place]) 11/09/2016  . Tendon tear, ankle, left, sequela   . Traumatic rupture of left anterior tibial tendon 04/20/2016  . Acute on chronic kidney failure ([place])   . 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 ([place])   . Headache, migraine   . Unexplained weight loss 11/17/2015  . Acute kidney injury superimposed on chronic kidney disease ([place]) 11/17/2015  . Restless legs syndrome 07/24/2015  . Mild cognitive impairment 01/08/2015  . Abnormality of gait  01/08/2015  . Dizziness 12/19/2014  . Preoperative cardiovascular examination 12/11/2013  . GERD (gastroesophageal reflux disease) 10/06/2013  . Syncope 10/05/2013  . Dementia 12/06/2012  . Anxiety 10/28/2012    Class: Acute  . Bilateral lower extremity edema 10/28/2012  . CAD S/P percutaneous coronary angioplasty   . Essential hypertension   . Dyslipidemia, goal LDL below 70    Past Medical History:  Diagnosis Date  . Anxiety   . Arthritis     osteoarthritis  . Breast cancer ([place])   . Breast cancer in female [facility])    Bilateral  . CAD S/P percutaneous coronary angioplasty 2007; March 2011   [facility]' EMS 3.0 mm 20 mm postdilated 3.6 mm in early mid LAD;  status post ISR Cutting Balloon PTCA and March 11 along with PCI of distal mid lesion with a 3.0 mm 12 mm MultiLink vision BMS; the proximal stent causes jailing of SP1 and SP2 with ostial 70-80% lesions  . Cancer (HCC)    Melanoma, Squamous cell Carcinoma  . CHF (congestive heart failure) ([place])   . Chronic kidney disease    stage 2   . Dementia    mild  . Diabetes mellitus type 2 with neurological manifestations ([place])   . Dyslipidemia, goal LDL below 70   . Full dentures   . Genetic testing 12/03/2016   Germline genetic testing was performed through [facility] Common Hereditary Cancers Panel + [facility] Melanoma Panel. This custom panel includes analysis of the following 51 genes: APC, ATM, AXIN2, BAP1, BARD1, BMPR1A, BRCA1, BRCA2, BRIP1, CDH1, CDK4, CDKN2A, CHEK2, CTNNA1, DICER1, EPCAM, GREM1, HOXB13, KIT, MEN1, MITF, MLH1, MSH2, MSH3, MSH6, MUTYH, NBN, NF1, NTHL1, PALB2, PDGFRA, PMS2, POLD1, POL  . GERD (gastroesophageal reflux disease)   . Headache(784.0)   . History of hematuria    Followed by Dr. Gaynelle Arabian  . Hypertension, essential, benign   . Nausea & vomiting 10/2015  . Peripheral neuropathy   . Stroke ([place])   . TIA (transient ischemic attack)    multiple in the past  . Wears glasses     Family History  Problem  Relation Age of Onset  . Diabetes Mother   . Hypertension Mother   . Kidney disease Mother   . Hypertension Father   . Emphysema Father   . Heart attack Father   . Heart disease Father   . Arthritis Sister   . Diabetes Sister   . Hypertension Sister   . Breast cancer Sister 65       treated with mastectomy  . Cervical cancer Sister 31  . Hypertension Brother   . Hyperlipidemia Brother   . Diabetes Brother   . Stroke Brother   . Diabetes Sister   . Hypertension Sister   . Stomach cancer Sister 43  . Hypertension Brother   . ALS Brother        d.62s  . Breast cancer Daughter 103       triple negative treated with lumpectomy, chemo, radiation  . Heart attack Daughter 58       d.45  . COPD Daughter   . Cancer Paternal Aunt        unspecified type  . Cancer Paternal Uncle        unspecified type  . Cancer Paternal Uncle        unspecified type    Past Surgical History:  Procedure Laterality Date  . ABDOMINAL HYSTERECTOMY    . APPENDECTOMY    . BLADDER SUSPENSION    . BREAST LUMPECTOMY Bilateral 2018  . BREAST LUMPECTOMY WITH RADIOACTIVE SEED AND SENTINEL LYMPH NODE BIOPSY Bilateral 12/14/2016   Procedure: BILATERAL BREAST LUMPECTOMY WITH BILATERAL  RADIOACTIVE SEED AND BILATERAL AXILLARY  SENTINEL LYMPH NODE BIOPSY ERAS PATHWAY;  Surgeon: Alphonsa Overall, MD;  Location: [facility];  Service: General;  Laterality: Bilateral;  PECTORAL BLOCK  . CARDIAC CATHETERIZATION  02/24/2006   85% stenosis in the proximal portion of LAD-arrangements made for PCI on 02/25/2006  . CARDIAC CATHETERIZATION  02/25/2006   80% LAD lesion stented with a 3x41m Liberte stent resulting in reduction of 80% lesion to 0% residual  . CARDIAC CATHETERIZATION  04/26/2006   Medical management  . CARDIAC CATHETERIZATION  06/24/2009   60-70% re-stenosis in  the proximal LAD. A 3.25x15 cutting balloon, 3 inflations - 14atm-38sec, 13atm-39sec, and 12atm-40sec reduced to less than 10%. 60% stenosis of the mid/distal  LAD stented with a 3x53m Multilink stent.  .Marland KitchenCARDIAC CATHETERIZATION  07/09/2009   Medical management  . CAROTID DOPPLER  06/24/2009   40-59% R ICA stenosis. No significant ICA stenosis noted  . CHOLECYSTECTOMY    . DILATION AND CURETTAGE OF UTERUS    . JOINT REPLACEMENT Left   . LESION REMOVAL Right 03/11/2015   Procedure:  EXCISION OF RIGHT PRE TIBIAL LESION;  Surgeon: JJudeth Horn MD;  [facility] [facility]  Service: General;  Laterality: Right;  . MASS EXCISION Left 06/10/2014   Procedure: EXCISION LEFT LOWER LEG LESION;  Surgeon: JDoreen Salvage MD;  [facility] [facility]  Service: General;  Laterality: Left;  .Marland KitchenMASS EXCISION Left 09/05/2015   Procedure: EXCISION OF LEFT FOREARM  MASS;  Surgeon: JJudeth Horn MD;  [facility]  Service: General;  Laterality: Left;  . NM MYOVIEW LTD  11/16/2011   5 beats a PVC during recovery.  No skin or infarction.  Reached 5 METs, EKG negative for ischemia   . NM MYOVIEW LTD  10/2011   No ischemia or infarction.  Normal EF.  LOW RISK. (Short bursts of 5 beats NSVT during recovery otherwise normal)  . SHOULDER ARTHROSCOPY  10/15  . SHOULDER ARTHROSCOPY  1995   left  . TEE WITHOUT CARDIOVERSION N/A 08/03/2017   Procedure: TRANSESOPHAGEAL ECHOCARDIOGRAM (TEE);  Surgeon: RSkeet Latch MD;  [facility] [facility]  Service: Cardiovascular;; EF 60-65% with no wall motion normalities.  No atrial thrombus noted.  Lightly findings consistent with a lipomatous hypertrophy of the atrial septum.   . TENDON REPAIR Left 05/12/2016   Procedure: Left Anterior Tibial Tendon Reconstruction;  Surgeon: MNewt Minion MD;  Location: [facility]  Service: Orthopedics;  Laterality: Left;  . TOTAL KNEE ARTHROPLASTY  2005   left  . TRANSTHORACIC ECHOCARDIOGRAM  10/2015   EF 55-60 %. Mild LVH. Mild pulmonary hypertension. Normal valves.  . TRANSTHORACIC ECHOCARDIOGRAM  07/2017   Vigorous LV function.  EF 65-70%.  Mild diastolic  dysfunction with no RWMA. GR 1 DD.  Mild aortic sclerosis/no stenosis.  Questionable right atrial mass discounted with TEE)   . WRIST ARTHROPLASTY  2010   cancer lt wrist   Social History   Occupational History  . Occupation: retired    EFish farm manager RETIRED  Tobacco Use  . Smoking status: Never Smoker  . Smokeless tobacco: Never Used  Substance and Sexual Activity  . Alcohol use: No  . Drug use: No  . Sexual activity: Never

## 2017-12-26 ENCOUNTER — Other Ambulatory Visit (INDEPENDENT_AMBULATORY_CARE_PROVIDER_SITE_OTHER): Payer: Self-pay

## 2017-12-26 DIAGNOSIS — M1A0720 Idiopathic chronic gout, left ankle and foot, without tophus (tophi): Secondary | ICD-10-CM

## 2017-12-26 MED ORDER — ALLOPURINOL 100 MG PO TABS: 100.0000 mg | ORAL_TABLET | Freq: Two times a day (BID) | ORAL | 0 refills | Status: DC

## 2017-12-29 DIAGNOSIS — E7800 Pure hypercholesterolemia, unspecified: Secondary | ICD-10-CM | POA: Diagnosis not present

## 2017-12-29 DIAGNOSIS — E1165 Type 2 diabetes mellitus with hyperglycemia: Secondary | ICD-10-CM | POA: Diagnosis not present

## 2017-12-29 DIAGNOSIS — G609 Hereditary and idiopathic neuropathy, unspecified: Secondary | ICD-10-CM | POA: Diagnosis not present

## 2017-12-29 DIAGNOSIS — I10 Essential (primary) hypertension: Secondary | ICD-10-CM | POA: Diagnosis not present

## 2017-12-30 ENCOUNTER — Ambulatory Visit (INDEPENDENT_AMBULATORY_CARE_PROVIDER_SITE_OTHER): Payer: Medicare Other | Admitting: Physician Assistant

## 2018-01-02 ENCOUNTER — Encounter: Payer: Self-pay | Admitting: Cardiology

## 2018-01-02 ENCOUNTER — Ambulatory Visit (INDEPENDENT_AMBULATORY_CARE_PROVIDER_SITE_OTHER): Payer: Medicare Other | Admitting: Cardiology

## 2018-01-02 VITALS — BP 112/68 | HR 87 | Ht 66.0 in | Wt 185.0 lb

## 2018-01-02 DIAGNOSIS — I10 Essential (primary) hypertension: Secondary | ICD-10-CM

## 2018-01-02 DIAGNOSIS — E785 Hyperlipidemia, unspecified: Secondary | ICD-10-CM | POA: Diagnosis not present

## 2018-01-02 DIAGNOSIS — R42 Dizziness and giddiness: Secondary | ICD-10-CM

## 2018-01-02 DIAGNOSIS — I2510 Atherosclerotic heart disease of native coronary artery without angina pectoris: Secondary | ICD-10-CM | POA: Diagnosis not present

## 2018-01-02 DIAGNOSIS — Z9861 Coronary angioplasty status: Secondary | ICD-10-CM

## 2018-01-02 DIAGNOSIS — N183 Chronic kidney disease, stage 3 unspecified: Secondary | ICD-10-CM

## 2018-01-02 DIAGNOSIS — I5032 Chronic diastolic (congestive) heart failure: Secondary | ICD-10-CM | POA: Diagnosis not present

## 2018-01-02 NOTE — Patient Instructions (Addendum)
Medication Instructions: no  No changes in current medications If you need a refill on your cardiac medications before your next appointment, please call your pharmacy.   Lab work: No labs needed If you have labs (blood work) drawn today and your tests are completely normal, you will receive your results only by: Marland Kitchen MyChart Message (if you have MyChart) OR . A paper copy in the mail If you have any lab test that is abnormal or we need to change your treatment, we will call you to review the results.  Testing/Procedures: No test needed  Follow-Up: At [facility], you and your health needs are our priority.  As part of our continuing mission to provide you with exceptional heart care, we have created designated Provider Care Teams.  These Care Teams include your primary Cardiologist (physician) and Advanced Practice Providers (APPs -  Physician Assistants and Nurse Practitioners) who all work together to provide you with the care you need, when you need it. You will need a follow up appointment in 12 months.  Please call our office 2 months in advance to schedule this appointment.  You may see [name]  or one of the following Advanced Practice Providers on your designated Care Team:   Rosaria Ferries, PA-C . Jory Sims, DNP, ANP  Any Other Special Instructions Will Be Listed Below (If Applicable).  IF BLOOD PRESSURES  DECREASE - GO LAY DOWN AND DRINK A BOTTLE OF WATER

## 2018-01-02 NOTE — Progress Notes (Signed)
PCP: Redmond School, MD Nephrologist: [name]  Clinic Note: Chief Complaint  Patient presents with  . Follow-up    Mostly notes being borderline hypotensive to notably hypotensive.  . Coronary Artery Disease    HPI: [name] is a 77 y.o. female with a PMH notable for CAD and PCI as well as chronic diastolic heart failure (ankle swelling) who presents today for 69-monthfollow-up    CAD history: History of BMS PCI to the LAD in 2006 --had post dilation PTCA for ISR in 2011 -otherwise stable since then.  Nonischemic Myoview in August 2013 (did have a short burst of 5 beats NSVT) read as LOW RISK. --No ischemia or infarction.  She has been also dealing with right breast cancer..[name] was last seen on September 01, 2017--for delayed annual follow-up.  She returned from a cardiology standpoint blood pressures.  Slight decrease in renal function.  Mild lower extremity edema --not quite back to 175 lb dry weight.  No PND orthopnea or anginal symptoms. Still requires intermittent self-catheterization.  Has been started on metolazone by nephrologist along with Bumex '2mg'$  tab (3 tab daily).  Losartan was stopped for hypotension.  Adjusted dry wgt to 178 lb.  Recent Hospitalizations:   n/a  Studies Personally Reviewed - (if available, images/films reviewed: From Epic Chart or Care Everywhere)  none  Interval History: PSharlottereturns today still doing well.  ~ 1 time per weeek has an episode of low BP in 80s-90s.  BP levels have otherwise been excellent. Edema is relatively controlled. -- still wears support stockings --her diuretic regimen with metolazone and Bumex has been controlled by her nephrologist..  However, she still does not have any PND orthopnea.  The biggest issue with her is that she remains dizzy and has poor balance.  She does not do the treadmill anymore.  She used to ride a stationary bike much more than she does now, but of late has night not even been doing that. She  is been having some issues with her tendons in her feet and ankles and is seeing Dr. DKoleen Distance she has not had any chest tightness or pressure with rest or exertion besides her random episodes of sharp twinging type symptoms. Heart rate seems to be relatively stable with no rapid irregular heartbeats or palpitations.  She is again here with her husband who seems to do most of the answering for her and contradicts her answers.  He keeps track of her blood pressures, urine output etc..  She still has to do intermittent self-catheterization. Despite having occasional flip-flop skipping beats but no rapid irregular heartbeats palpitations.  No syncope or near syncope.  She does feel quite lightheaded and dizzy when her blood pressure is low but has not felt as though she will pass out.  No TIA or amaurosis fugax symptoms.  No melena, hematochezia, hematuria, or epstaxis.  ROS: A comprehensive was performed. Review of Systems  Constitutional: Positive for malaise/fatigue (It just takes her quite a bit of time to recover from any setback including to simple falls.). Negative for chills and fever.  HENT: Negative for congestion and nosebleeds.   Respiratory: Positive for shortness of breath (No more than baseline). Negative for cough.   Gastrointestinal: Negative for abdominal pain and heartburn.  Genitourinary: Negative for dysuria and hematuria.       Still has to do in and out caths  Musculoskeletal: Positive for falls and joint pain. Negative for myalgias.  Neurological: Positive for dizziness (Rare now), weakness (Mostly related to balance, both legs just feel weak) and headaches.  Psychiatric/Behavioral: Positive for memory loss. The patient is nervous/anxious.   All other systems reviewed and are negative.   I have reviewed and (if needed) personally updated the patient's problem list, medications, allergies, past medical and surgical history, social and family history.   Past  Medical History:  Diagnosis Date  . Anxiety   . Arthritis     osteoarthritis  . Breast cancer ([place])   . Breast cancer in female [facility])    Bilateral  . CAD S/P percutaneous coronary angioplasty 2007; March 2011   [employer] 3.0 mm 20 mm postdilated 3.6 mm in early mid LAD; status post ISR Cutting Balloon PTCA and March 11 along with PCI of distal mid lesion with a 3.0 mm 12 mm MultiLink vision BMS; the proximal stent causes jailing of SP1 and SP2 with ostial 70-80% lesions  . Cancer (HCC)    Melanoma, Squamous cell Carcinoma  . CHF (congestive heart failure) ([place])   . Chronic kidney disease    stage 2   . Dementia ([place])    mild  . Diabetes mellitus type 2 with neurological manifestations ([place])   . Dyslipidemia, goal LDL below 70   . Full dentures   . Genetic testing 12/03/2016   Germline genetic testing was performed through [facility] Common Hereditary Cancers Panel + [facility] Melanoma Panel. This custom panel includes analysis of the following 51 genes: APC, ATM, AXIN2, BAP1, BARD1, BMPR1A, BRCA1, BRCA2, BRIP1, CDH1, CDK4, CDKN2A, CHEK2, CTNNA1, DICER1, EPCAM, GREM1, HOXB13, KIT, MEN1, MITF, MLH1, MSH2, MSH3, MSH6, MUTYH, NBN, NF1, NTHL1, PALB2, PDGFRA, PMS2, POLD1, POL  . GERD (gastroesophageal reflux disease)   . Headache(784.0)   . History of hematuria    Followed by Dr. Gaynelle Arabian  . Hypertension, essential, benign   . Nausea & vomiting 10/2015  . Peripheral neuropathy   . Stroke ([place])   . TIA (transient ischemic attack)    multiple in the past  . Wears glasses     Past Surgical History:  Procedure Laterality Date  . ABDOMINAL HYSTERECTOMY    . APPENDECTOMY    . BLADDER SUSPENSION    . BREAST LUMPECTOMY Bilateral 2018  . BREAST LUMPECTOMY WITH RADIOACTIVE SEED AND SENTINEL LYMPH NODE BIOPSY Bilateral 12/14/2016   Procedure: BILATERAL BREAST LUMPECTOMY WITH BILATERAL  RADIOACTIVE SEED AND BILATERAL AXILLARY  SENTINEL LYMPH NODE BIOPSY ERAS PATHWAY;  Surgeon: Alphonsa Overall,  MD;  Location: [facility];  Service: General;  Laterality: Bilateral;  PECTORAL BLOCK  . CARDIAC CATHETERIZATION  02/24/2006   85% stenosis in the proximal portion of LAD-arrangements made for PCI on 02/25/2006  . CARDIAC CATHETERIZATION  02/25/2006   80% LAD lesion stented with a 3x27m Liberte stent resulting in reduction of 80% lesion to 0% residual  . CARDIAC CATHETERIZATION  04/26/2006   Medical management  . CARDIAC CATHETERIZATION  06/24/2009   60-70% re-stenosis in the proximal LAD. A 3.25x15 cutting balloon, 3 inflations - 14atm-38sec, 13atm-39sec, and 12atm-40sec reduced to less than 10%. 60% stenosis of the mid/distal LAD stented with a 3x17mMultilink stent.  . Marland KitchenARDIAC CATHETERIZATION  07/09/2009   Medical management  . CAROTID DOPPLER  06/24/2009   40-59% R ICA stenosis. No significant ICA stenosis noted  . CHOLECYSTECTOMY    . DILATION AND CURETTAGE OF UTERUS    . JOINT REPLACEMENT Left   . LESION REMOVAL Right 03/11/2015   Procedure:  EXCISION OF RIGHT  PRE TIBIAL LESION;  Surgeon: Judeth Horn, MD;  Location: [facility];  Service: General;  Laterality: Right;  . MASS EXCISION Left 06/10/2014   Procedure: EXCISION LEFT LOWER LEG LESION;  Surgeon: Doreen Salvage, MD;  Location: [facility];  Service: General;  Laterality: Left;  Marland Kitchen MASS EXCISION Left 09/05/2015   Procedure: EXCISION OF LEFT FOREARM  MASS;  Surgeon: Judeth Horn, MD;  Location: [facility];  Service: General;  Laterality: Left;  . NM MYOVIEW LTD  11/16/2011   5 beats a PVC during recovery.  No skin or infarction.  Reached 5 METs, EKG negative for ischemia   . NM MYOVIEW LTD  10/2011   No ischemia or infarction.  Normal EF.  LOW RISK. (Short bursts of 5 beats NSVT during recovery otherwise normal)  . SHOULDER ARTHROSCOPY  10/15  . SHOULDER ARTHROSCOPY  1995   left  . TEE WITHOUT CARDIOVERSION N/A 08/03/2017   Procedure: TRANSESOPHAGEAL ECHOCARDIOGRAM (TEE);  Surgeon: Skeet Latch,  MD;  Location: [facility];  Service: Cardiovascular;; EF 60-65% with no wall motion normalities.  No atrial thrombus noted.  Lightly findings consistent with a lipomatous hypertrophy of the atrial septum.   . TENDON REPAIR Left 05/12/2016   Procedure: Left Anterior Tibial Tendon Reconstruction;  Surgeon: Newt Minion, MD;  Location: [facility];  Service: Orthopedics;  Laterality: Left;  . TOTAL KNEE ARTHROPLASTY  2005   left  . TRANSESOPHAGEAL ECHOCARDIOGRAM  08/03/2016   : EF 60-65% no wall motion normalities.  No atrial thrombus.  Lipomatous hypertrophy of the atrial septum.  No mass noted.  . TRANSTHORACIC ECHOCARDIOGRAM  10/2015   EF 55-60 %. Mild LVH. Mild pulmonary hypertension. Normal valves.  . TRANSTHORACIC ECHOCARDIOGRAM  07/2017   Vigorous LV function.  EF 65-70%.  Mild diastolic dysfunction with no RWMA. GR 1 DD.  Mild aortic sclerosis/no stenosis.  Questionable right atrial mass discounted with TEE)   . WRIST ARTHROPLASTY  2010   cancer lt wrist     TRANSESOPHAGEAL ECHO Aug 03, 2017: EF 60-65% no wall motion normalities.  No atrial thrombus.  Lipomatous hypertrophy of the atrial septum.  No mass noted.  Current Meds  Medication Sig  . allopurinol (ZYLOPRIM) 100 MG tablet Take 1 tablet (100 mg total) by mouth 2 (two) times daily.  . bumetanide (BUMEX) 2 MG tablet Take 2 mg by mouth 2 (two) times daily. Patient currently taking 2 tablets in am and 1 in pm  . Calcium Carb-Cholecalciferol (CALCIUM 600 + D PO) Take 1 tablet by mouth at bedtime.   . Cholecalciferol 2000 units CAPS Take 2,000 Units by mouth daily.  . clopidogrel (PLAVIX) 75 MG tablet Take 1 tablet (75 mg total) by mouth daily.  . COD LIVER OIL PO Take 415 mg by mouth daily.  . colchicine 0.6 MG tablet Take 1 tablet (0.6 mg total) by mouth daily.  Marland Kitchen CRANBERRY PO Take 1 capsule by mouth daily.  . diazepam (VALIUM) 5 MG tablet Take 5 mg by mouth daily as needed for anxiety.  . diphenhydrAMINE (BENADRYL) 50 MG capsule Take  50 mg by mouth at bedtime as needed for sleep.   Marland Kitchen exemestane (AROMASIN) 25 MG tablet Take 1 tablet (25 mg total) daily after breakfast by mouth.  . Fish Oil-Krill Oil (KRILL OIL PLUS PO) Take 1 capsule by mouth daily.   Marland Kitchen gabapentin (NEURONTIN) 300 MG capsule Take 1 capsule (300 mg total) by mouth 2 (two) times daily. Take 1  during the day if needed and 2 at night  . insulin NPH Human (HUMULIN N,NOVOLIN N) 100 UNIT/ML injection Inject 15-18 Units into the skin 2 (two) times daily before a meal. 20 units am and 20 units PM  . Iron-FA-B Cmp-C-Biot-Probiotic (FUSION PLUS) CAPS takes 1 tablet daily  . Melatonin 5 MG CAPS Take 5 mg by mouth at bedtime as needed (sleep).   . Methylfol-Methylcob-Acetylcyst (CEREFOLIN NAC) 6-2-600 MG TABS TAKE 1 CAPLET BY MOUTH ONCE DAILY.  . metolazone (ZAROXOLYN) 2.5 MG tablet Take 30 min before the morning dose of bumex as directed daily. (Patient taking differently: Take 2.5 mg by mouth. Take 30 min before the morning dose of bumex as directed daily. Monday and Thursday)  . metoprolol succinate (TOPROL XL) 50 MG 24 hr tablet TAKE ONE-HALF (1/2) TABLET DAILY  . mirtazapine (REMERON) 15 MG tablet Take 15 mg by mouth at bedtime. 11/2 tablet at bedtime  . pantoprazole (PROTONIX) 40 MG tablet TAKE 1 TABLET DAILY (CONTACT OFFICE FOR ADDITIONAL REFILLS, FINAL ATTEMPT)  . potassium chloride SA (K-DUR,KLOR-CON) 20 MEQ tablet Take 40 mEq by mouth 2 (two) times daily.   . pramipexole (MIRAPEX) 0.25 MG tablet TAKE 1 TABLET AT 4:00 P.M. AND 2 TABLETS (0.5 MG) AT BEDTIME  . Resveratrol (RESERVAPAK PO) Take 1 tablet by mouth every evening.   . rosuvastatin (CRESTOR) 20 MG tablet Take 20 mg by mouth at bedtime.  Marland Kitchen Ubiquinol (QUNOL COQ10/UBIQUINOL/MEGA) 100 MG CAPS Take 1 tablet by mouth daily. CoQ10, Qunol    Allergies  Allergen Reactions  . Lipitor [Atorvastatin] Other (See Comments)    Bone and muscle pain  . Lyrica [Pregabalin]     Weight gain, extremity swelling  . Nsaids  Swelling  . Reglan [Metoclopramide]     Tardive dyskinesia; paradoxical reaction not relieved by benadryl  . Ultram [Tramadol] Other (See Comments)    Pt has seizure activity with this medication    Social History   Tobacco Use  . Smoking status: Never Smoker  . Smokeless tobacco: Never Used  Substance Use Topics  . Alcohol use: No  . Drug use: No   Social History   Social History Narrative   She is a married mother of 21, grandmother of 2.  Usually very active and out about the house.  But currently over last 6-8 weeks has been incapacitated.  She never smoked and does not drink alcohol.   10 th grade  DNR=--   family history includes ALS in her brother; Arthritis in her sister; Breast cancer (age of onset: 65) in her sister; Breast cancer (age of onset: 24) in her daughter; COPD in her daughter; Cancer in her paternal aunt, paternal uncle, and paternal uncle; Cervical cancer (age of onset: 25) in her sister; Diabetes in her brother, mother, sister, and sister; Emphysema in her father; Heart attack in her father; Heart attack (age of onset: 31) in her daughter; Heart disease in her father; Hyperlipidemia in her brother; Hypertension in her brother, brother, father, mother, sister, and sister; Kidney disease in her mother; Stomach cancer (age of onset: 5) in her sister; Stroke in her brother.  Wt Readings from Last 3 Encounters:  01/02/18 185 lb (83.9 kg)  12/22/17 180 lb (81.6 kg)  12/22/17 179 lb (81.2 kg)    PHYSICAL EXAM BP 112/68   Pulse 87   Ht '5\' 6"'$  (1.676 m)   Wt 185 lb (83.9 kg)   BMI 29.86 kg/m  Physical Exam  Constitutional: She is  oriented to person, place, and time. She appears well-developed and well-nourished. No distress.  Borderline obese.  Otherwise healthy-appearing.  Well-groomed  HENT:  Head: Normocephalic and atraumatic.  Neck: Normal range of motion. Neck supple. No hepatojugular reflux and no JVD present. Carotid bruit is not present.    Cardiovascular: Normal rate, regular rhythm, normal heart sounds, intact distal pulses and normal pulses.  No extrasystoles are present. PMI is not displaced. Exam reveals no gallop and no friction rub.  No murmur heard. Pulmonary/Chest: Effort normal and breath sounds normal. No respiratory distress. She has no wheezes. She has no rales. She exhibits tenderness (Costochondral tenderness -less prominent today.).  Abdominal: Soft. Bowel sounds are normal. She exhibits no distension. There is no tenderness. There is no rebound.  Musculoskeletal: Normal range of motion. She exhibits edema (1+ bilateral LE).  Neurological: She is alert and oriented to person, place, and time.  Still seems a little frazzled and anxious  Psychiatric: She has a normal mood and affect. Her behavior is normal. Judgment and thought content normal.  She actually seems much less anxious and nervous today.  She does get a little bit flustered during the interview.  Vitals reviewed.    Adult ECG Report Not checked  Other studies Reviewed: Additional studies/ records that were reviewed today include:  Recent Labs:   From (K [facility]) May 04, 2017: Total cholesterol 151, triglycerides 163, HDL 46, LDL 72.  BUN/creatinine 57/2.22.  TSH 3.4.  A1c 5.4. Lab Results  Component Value Date   CREATININE 2.22 (H) 08/15/2017   BUN 57 (H) 08/15/2017   NA 142 08/15/2017   K 4.0 08/15/2017   CL 105 08/15/2017   CO2 26 08/15/2017   --From nephrology clinic visit December 07, 2017: BUN/creatinine 47/2.71.  Albumin 4.5.  Glucose 249.  Potassium 2.2. ->  She was placed on increased dose of potassium supplementation.  ASSESSMENT / PLAN: Problem List Items Addressed This Visit    CAD S/P percutaneous coronary angioplasty - Primary (Chronic)    Distant history of bare-metal stent PCI to the LAD with PTCA for in-stent restenosis in 2011.  Really has not had true anginal symptoms since.  Myoview in 2013 was negative for ischemia. She  is on Plavix, beta-blocker and statin.  --Okay to hold Plavix for procedures.      Chronic diastolic heart failure (HCC) (Chronic)    Weekly, do not think that her edema is related to heart failure think is more related to venous insufficiency.  She is not having PND and orthopnea type symptoms.  Continue diuretic regimen per nephrology. Not on ACE inhibitor/ARB because of renal insufficiency. In fact were can probably try lower capital bit more permissive hypertension.      CKD (chronic kidney disease) stage 3, GFR 30-59 ml/min (HCC) (Chronic)   Dizziness (Chronic)    Overall this seems to be better with allowing for more blood pressure.  Other than being on metoprolol, I do not see a lot of medicines.  We may eventually have to wean off the Toprol.  This case, we would allow for permissive hypertension.      Dyslipidemia, goal LDL below 70 (Chronic)    LDL is pretty much at goal back in February on current dose of Crestor.  At this stage, given her age and comorbidities, I would not push it any further.  Continue Crestor at current dose.      Essential hypertension (Chronic)    Interestingly, she seems to be doing better  no longer on ARB.  I would be more inclined allow her to have higher blood pressure than she currently has.  I told her if she is feeling lightheaded or dizzy, she can hold the dose of metoprolol.  Otherwise I think we will be happy with her current blood pressure medicine regimen.  She has not had any profound hypotension spells that she has not been able to actually just counteract by sitting down.  --When she does have an episode of hypotension, she needs to sit or lie down and drink some water.          I spent a total of 30 minutes with the patient and chart review. >  50% of the time was spent in direct patient consultation.   Current medicines are reviewed at length with the patient today.  (+/- concerns) n/a The following changes have been made:  see  below  Patient Instructions  Medication Instructions: no  No changes in current medications If you need a refill on your cardiac medications before your next appointment, please call your pharmacy.   Lab work: No labs needed If you have labs (blood work) drawn today and your tests are completely normal, you will receive your results only by: Marland Kitchen MyChart Message (if you have MyChart) OR . A paper copy in the mail If you have any lab test that is abnormal or we need to change your treatment, we will call you to review the results.  Testing/Procedures: No test needed  Follow-Up: At [facility], you and your health needs are our priority.  As part of our continuing mission to provide you with exceptional heart care, we have created designated Provider Care Teams.  These Care Teams include your primary Cardiologist (physician) and Advanced Practice Providers (APPs -  Physician Assistants and Nurse Practitioners) who all work together to provide you with the care you need, when you need it. You will need a follow up appointment in 12 months.  Please call our office 2 months in advance to schedule this appointment.  You may see [name]  or one of the following Advanced Practice Providers on your designated Care Team:   Rosaria Ferries, PA-C . Jory Sims, DNP, ANP  Any Other Special Instructions Will Be Listed Below (If Applicable).  IF BLOOD PRESSURES  DECREASE - GO LAY DOWN AND DRINK A BOTTLE OF WATER   Studies Ordered:   No orders of the defined types were placed in this encounter.     Glenetta Hew, M.D., M.S. Interventional Cardiologist   Pager # [phone] Phone # [phone] [address]. [place], [place] [address]   Thank you for choosing Heartcare at [facility]!!

## 2018-01-03 ENCOUNTER — Ambulatory Visit (INDEPENDENT_AMBULATORY_CARE_PROVIDER_SITE_OTHER): Payer: Medicare Other | Admitting: Orthopedic Surgery

## 2018-01-03 ENCOUNTER — Encounter: Payer: Self-pay | Admitting: Cardiology

## 2018-01-03 ENCOUNTER — Ambulatory Visit (INDEPENDENT_AMBULATORY_CARE_PROVIDER_SITE_OTHER): Payer: Medicare Other

## 2018-01-03 ENCOUNTER — Encounter (INDEPENDENT_AMBULATORY_CARE_PROVIDER_SITE_OTHER): Payer: Self-pay | Admitting: Orthopedic Surgery

## 2018-01-03 DIAGNOSIS — Z9861 Coronary angioplasty status: Secondary | ICD-10-CM

## 2018-01-03 DIAGNOSIS — M25571 Pain in right ankle and joints of right foot: Secondary | ICD-10-CM

## 2018-01-03 DIAGNOSIS — I2510 Atherosclerotic heart disease of native coronary artery without angina pectoris: Secondary | ICD-10-CM | POA: Diagnosis not present

## 2018-01-03 DIAGNOSIS — E11610 Type 2 diabetes mellitus with diabetic neuropathic arthropathy: Secondary | ICD-10-CM | POA: Diagnosis not present

## 2018-01-03 MED ORDER — OXYCODONE-ACETAMINOPHEN 5-325 MG PO TABS: 1.0000 | ORAL_TABLET | ORAL | 0 refills | Status: DC | PRN

## 2018-01-03 MED ORDER — ALLOPURINOL 100 MG PO TABS: 100.0000 mg | ORAL_TABLET | Freq: Two times a day (BID) | ORAL | 3 refills | Status: DC

## 2018-01-03 NOTE — Assessment & Plan Note (Signed)
Interestingly, she seems to be doing better no longer on ARB.  I would be more inclined allow her to have higher blood pressure than she currently has.  I told her if she is feeling lightheaded or dizzy, she can hold the dose of metoprolol.  Otherwise I think we will be happy with her current blood pressure medicine regimen.  She has not had any profound hypotension spells that she has not been able to actually just counteract by sitting down.  --When she does have an episode of hypotension, she needs to sit or lie down and drink some water.

## 2018-01-03 NOTE — Assessment & Plan Note (Signed)
Distant history of bare-metal stent PCI to the LAD with PTCA for in-stent restenosis in 2011.  Really has not had true anginal symptoms since.  Myoview in 2013 was negative for ischemia. She is on Plavix, beta-blocker and statin.  --Okay to hold Plavix for procedures.

## 2018-01-03 NOTE — Progress Notes (Signed)
Office Visit Note   Patient: [name]           Date of Birth: 09/23/1940           MRN: [id number] Visit Date: 01/03/2018              Requested by: Redmond School, [place], [place] [address] PCP: Red[place] School, MD  Chief Complaint  Patient presents with  . Right Foot - Pain      HPI: Patient is a 77 year old woman with diabetes end-stage renal disease gout who is currently on allopurinol twice a day.  Patient states that she felt an acute pop while walking in her right foot.  Patient states that after this acute pop she had increased swelling and pain.  Assessment & Plan: Visit Diagnoses:  1. Pain in right ankle and joints of right foot     Plan: Recommend patient take her Neurontin 3 times a day instead of 2 with night and she can take another at night if needed she is taking 300 mg tablets.  Urine  Follow-Up Instructions: No follow-ups on file.   Ortho Exam  Patient is alert, oriented, no adenopathy, well-dressed, normal affect, normal respiratory effort. Examination patient has a good dorsalis pedis pulse she does have pronation and valgus of the forefoot her husband states this is been going on for 3 months.  She does have instability and pain to palpation across the midfoot.  She has a good dorsalis pedis pulse she does have increased venous stasis swelling with brawny skin color changes and no open ulcers.  Patient states she is currently on the allopurinol twice a day.  Imaging: No results found. No images are attached to the encounter.  Labs: Lab Results  Component Value Date   HGBA1C 5.7 (H) 12/07/2016   HGBA1C 5.8 (H) 05/06/2016   HGBA1C 6.0 (H) 11/17/2015   LABURIC 8.7 (H) 12/22/2017   LABURIC 14.4 (H) 11/24/2017   LABURIC 7.9 (H) 11/11/2016   REPTSTATUS 11/18/2015 FINAL 11/17/2015   CULT (A) 11/17/2015    50,000 COLONIES/mL LACTOBACILLUS SPECIES Standardized susceptibility testing for this organism is not available.    LABORGA ESCHERICHIA COLI 12/31/2009     Lab Results  Component Value Date   ALBUMIN 3.8 08/15/2017   ALBUMIN 4.7 08/01/2017   ALBUMIN 3.7 04/12/2017   LABURIC 8.7 (H) 12/22/2017   LABURIC 14.4 (H) 11/24/2017   LABURIC 7.9 (H) 11/11/2016    There is no height or weight on file to calculate BMI.  Orders:  Orders Placed This Encounter  Procedures  . XR Foot Complete Right  . XR Ankle Complete Right   No orders of the defined types were placed in this encounter.    Procedures: No procedures performed  Clinical Data: No additional findings.  ROS:  All other systems negative, except as noted in the HPI. Review of Systems  Objective: Vital Signs: There were no vitals taken for this visit.  Specialty Comments:  No specialty comments available.  PMFS History: Patient Active Problem List   Diagnosis Date Noted  . Right atrial mass   . Chronic diastolic heart failure ([place]) 08/01/2017  . Genetic testing 12/03/2016  . 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 Malignant neoplasm of upper-outer quadrant of left breast in female, estrogen receptor positive ([place]) 11/19/2016  . Chronic idiopathic gout involving toe of left foot without tophus 11/15/2016  . Malignant 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, estrogen receptor positive ([place]) 11/09/2016  .  Tendon tear, ankle, left, sequela   . Traumatic rupture of left anterior tibial tendon 04/20/2016  . Acute on chronic kidney failure ([place])   . 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 ([place])   . Headache, migraine   . Unexplained weight loss 11/17/2015  . CKD (chronic kidney disease) stage 3, GFR 30-59 ml/min (HCC) 11/17/2015  . Restless legs syndrome 07/24/2015  . Mild cognitive impairment 01/08/2015  . Abnormality of gait 01/08/2015  . Dizziness 12/19/2014  . Preoperative cardiovascular examination 12/11/2013  . GERD (gastroesophageal reflux disease) 10/06/2013  . Syncope 10/05/2013  .  Dementia ([place]) 12/06/2012  . Anxiety 10/28/2012    Class: Acute  . Bilateral lower extremity edema 10/28/2012  . CAD S/P percutaneous coronary angioplasty   . Essential hypertension   . Dyslipidemia, goal LDL below 70    Past Medical History:  Diagnosis Date  . Anxiety   . Arthritis     osteoarthritis  . Breast cancer ([place])   . Breast cancer in female [facility])    Bilateral  . CAD S/P percutaneous coronary angioplasty 2007; March 2011   [employer] 3.0 mm 20 mm postdilated 3.6 mm in early mid LAD; status post ISR Cutting Balloon PTCA and March 11 along with PCI of distal mid lesion with a 3.0 mm 12 mm MultiLink vision BMS; the proximal stent causes jailing of SP1 and SP2 with ostial 70-80% lesions  . Cancer (HCC)    Melanoma, Squamous cell Carcinoma  . CHF (congestive heart failure) ([place])   . Chronic kidney disease    stage 2   . Dementia ([place])    mild  . Diabetes mellitus type 2 with neurological manifestations ([place])   . Dyslipidemia, goal LDL below 70   . Full dentures   . Genetic testing 12/03/2016   Germline genetic testing was performed through [facility]'s Common Hereditary Cancers Panel + [facility]'s Melanoma Panel. This custom panel includes analysis of the following 51 genes: APC, ATM, AXIN2, BAP1, BARD1, BMPR1A, BRCA1, BRCA2, BRIP1, CDH1, CDK4, CDKN2A, CHEK2, CTNNA1, DICER1, EPCAM, GREM1, HOXB13, KIT, MEN1, MITF, MLH1, MSH2, MSH3, MSH6, MUTYH, NBN, NF1, NTHL1, PALB2, PDGFRA, PMS2, POLD1, POL  . GERD (gastroesophageal reflux disease)   . Headache(784.0)   . History of hematuria    Followed by Dr. Gaynelle Arabian  . Hypertension, essential, benign   . Nausea & vomiting 10/2015  . Peripheral neuropathy   . Stroke ([place])   . TIA (transient ischemic attack)    multiple in the past  . Wears glasses     Family History  Problem Relation Age of Onset  . Diabetes Mother   . Hypertension Mother   . Kidney disease Mother   . Hypertension Father   . Emphysema Father   . Heart attack  Father   . Heart disease Father   . Arthritis Sister   . Diabetes Sister   . Hypertension Sister   . Breast cancer Sister 52       treated with mastectomy  . Cervical cancer Sister 63  . Hypertension Brother   . Hyperlipidemia Brother   . Diabetes Brother   . Stroke Brother   . Diabetes Sister   . Hypertension Sister   . Stomach cancer Sister 40  . Hypertension Brother   . ALS Brother        d.62s  . Breast cancer Daughter 76       triple negative treated with lumpectomy, chemo, radiation  . Heart attack Daughter 60  d.10  . COPD Daughter   . Cancer Paternal Aunt        unspecified type  . Cancer Paternal Uncle        unspecified type  . Cancer Paternal Uncle        unspecified type    Past Surgical History:  Procedure Laterality Date  . ABDOMINAL HYSTERECTOMY    . APPENDECTOMY    . BLADDER SUSPENSION    . BREAST LUMPECTOMY Bilateral 2018  . BREAST LUMPECTOMY WITH RADIOACTIVE SEED AND SENTINEL LYMPH NODE BIOPSY Bilateral 12/14/2016   Procedure: BILATERAL BREAST LUMPECTOMY WITH BILATERAL  RADIOACTIVE SEED AND BILATERAL AXILLARY  SENTINEL LYMPH NODE BIOPSY ERAS PATHWAY;  Surgeon: Alphonsa Overall, MD;  Location: [facility];  Service: General;  Laterality: Bilateral;  PECTORAL BLOCK  . CARDIAC CATHETERIZATION  02/24/2006   85% stenosis in the proximal portion of LAD-arrangements made for PCI on 02/25/2006  . CARDIAC CATHETERIZATION  02/25/2006   80% LAD lesion stented with a 3x75m Liberte stent resulting in reduction of 80% lesion to 0% residual  . CARDIAC CATHETERIZATION  04/26/2006   Medical management  . CARDIAC CATHETERIZATION  06/24/2009   60-70% re-stenosis in the proximal LAD. A 3.25x15 cutting balloon, 3 inflations - 14atm-38sec, 13atm-39sec, and 12atm-40sec reduced to less than 10%. 60% stenosis of the mid/distal LAD stented with a 3x128mMultilink stent.  . Marland KitchenARDIAC CATHETERIZATION  07/09/2009   Medical management  . CAROTID DOPPLER  06/24/2009   40-59% R ICA stenosis.  No significant ICA stenosis noted  . CHOLECYSTECTOMY    . DILATION AND CURETTAGE OF UTERUS    . JOINT REPLACEMENT Left   . LESION REMOVAL Right 03/11/2015   Procedure:  EXCISION OF RIGHT PRE TIBIAL LESION;  Surgeon: JaJudeth HornMD;  [place] [facility] Service: General;  Laterality: Right;  . MASS EXCISION Left 06/10/2014   Procedure: EXCISION LEFT LOWER LEG LESION;  Surgeon: JaDoreen SalvageMD;  [facility] Service: General;  Laterality: Left;  . Marland KitchenASS EXCISION Left 09/05/2015   Procedure: EXCISION OF LEFT FOREARM  MASS;  Surgeon: JaJudeth HornMD;  [facility] Service: General;  Laterality: Left;  . NM MYOVIEW LTD  11/16/2011   5 beats a PVC during recovery.  No skin or infarction.  Reached 5 METs, EKG negative for ischemia   . NM MYOVIEW LTD  10/2011   No ischemia or infarction.  Normal EF.  LOW RISK. (Short bursts of 5 beats NSVT during recovery otherwise normal)  . SHOULDER ARTHROSCOPY  10/15  . SHOULDER ARTHROSCOPY  1995   left  . TEE WITHOUT CARDIOVERSION N/A 08/03/2017   Procedure: TRANSESOPHAGEAL ECHOCARDIOGRAM (TEE);  Surgeon: RaSkeet LatchMD;  [facility] Service: Cardiovascular;; EF 60-65% with no wall motion normalities.  No atrial thrombus noted.  Lightly findings consistent with a lipomatous hypertrophy of the atrial septum.   . TENDON REPAIR Left 05/12/2016   Procedure: Left Anterior Tibial Tendon Reconstruction;  Surgeon: MaNewt MinionMD;  [facility] Service: Orthopedics;  Laterality: Left;  . TOTAL KNEE ARTHROPLASTY  2005   left  . TRANSTHORACIC ECHOCARDIOGRAM  10/2015   EF 55-60 %. Mild LVH. Mild pulmonary hypertension. Normal valves.  . TRANSTHORACIC ECHOCARDIOGRAM  07/2017   Vigorous LV function.  EF 65-70%.  Mild diastolic dysfunction with no RWMA. GR 1 DD.  Mild aortic sclerosis/no stenosis.  Questionable right atrial mass discounted with TEE)   . WRIST ARTHROPLASTY  2010   cancer  lt wrist   Social History   Occupational History  . Occupation: retired    Fish farm manager: RETIRED  Tobacco Use  . Smoking status: Never Smoker  . Smokeless tobacco: Never Used  Substance and Sexual Activity  . Alcohol use: No  . Drug use: No  . Sexual activity: Never

## 2018-01-03 NOTE — Assessment & Plan Note (Signed)
LDL is pretty much at goal back in February on current dose of Crestor.  At this stage, given her age and comorbidities, I would not push it any further.  Continue Crestor at current dose.

## 2018-01-03 NOTE — Assessment & Plan Note (Signed)
Overall this seems to be better with allowing for more blood pressure.  Other than being on metoprolol, I do not see a lot of medicines.  We may eventually have to wean off the Toprol.  This case, we would allow for permissive hypertension.

## 2018-01-03 NOTE — Assessment & Plan Note (Signed)
Weekly, do not think that her edema is related to heart failure think is more related to venous insufficiency.  She is not having PND and orthopnea type symptoms.  Continue diuretic regimen per nephrology. Not on ACE inhibitor/ARB because of renal insufficiency. In fact were can probably try lower capital bit more permissive hypertension.

## 2018-01-05 DIAGNOSIS — E7800 Pure hypercholesterolemia, unspecified: Secondary | ICD-10-CM | POA: Diagnosis not present

## 2018-01-05 DIAGNOSIS — N189 Chronic kidney disease, unspecified: Secondary | ICD-10-CM | POA: Diagnosis not present

## 2018-01-05 DIAGNOSIS — E1165 Type 2 diabetes mellitus with hyperglycemia: Secondary | ICD-10-CM | POA: Diagnosis not present

## 2018-01-05 DIAGNOSIS — G609 Hereditary and idiopathic neuropathy, unspecified: Secondary | ICD-10-CM | POA: Diagnosis not present

## 2018-01-05 DIAGNOSIS — I10 Essential (primary) hypertension: Secondary | ICD-10-CM | POA: Diagnosis not present

## 2018-01-15 ENCOUNTER — Other Ambulatory Visit: Payer: Self-pay | Admitting: Hematology

## 2018-01-16 ENCOUNTER — Other Ambulatory Visit: Payer: Self-pay | Admitting: *Deleted

## 2018-01-16 NOTE — Patient Outreach (Signed)
[facility]) Care Management   01/16/2018  [name] Jun 05, 1940 [id number]  [name] is an 77 y.o. female  Subjective:  Patient states that she has fallen apart since last visit . Patient discussed problem with right foot turning inward,states she had recent visit to orthopedic MD and now wearing support brace/boot, complaint of right hip pain now.   Patient discussed recent visit to cardiology office states no changes to medication or plan   Patient discussed she has attended follow up visit with kidney doctor and endocrinologist.   Objective:  BP 110/60 (BP Location: Right Arm, Patient Position: Sitting, Cuff Size: Normal)   Pulse 98   Resp 18   Wt 183 lb (83 kg)   SpO2 98%   BMI 29.54 kg/m  Review of Systems  Constitutional: Negative.   HENT: Negative.   Eyes: Negative.   Respiratory: Negative.   Cardiovascular: Negative.   Gastrointestinal: Negative.   Genitourinary: Negative.   Musculoskeletal: Positive for joint pain.  Skin: Negative.   Neurological: Negative.   Endo/Heme/Allergies: Negative.   Psychiatric/Behavioral: Negative.     Physical Exam  Constitutional: She is oriented to person, place, and time. She appears well-developed and well-nourished.  Cardiovascular: Normal rate and normal heart sounds.  Respiratory: Effort normal and breath sounds normal.  GI: Soft. Bowel sounds are normal.  Neurological: She is alert and oriented to person, place, and time.  Skin: Skin is warm and dry.     Psychiatric: She has a normal mood and affect. Her behavior is normal. Judgment and thought content normal.    Encounter Medications:   Outpatient Encounter Medications as of 01/16/2018  Medication Sig Note  . allopurinol (ZYLOPRIM) 100 MG tablet Take 1 tablet (100 mg total) by mouth 2 (two) times daily.   Marland Kitchen allopurinol (ZYLOPRIM) 100 MG tablet Take 1 tablet (100 mg total) by mouth 2 (two) times daily.   . bumetanide (BUMEX) 2 MG tablet Take 2 mg  by mouth 2 (two) times daily. Patient currently taking 2 tablets in am and 1 in pm   . Calcium Carb-Cholecalciferol (CALCIUM 600 + D PO) Take 1 tablet by mouth at bedtime.    . Cholecalciferol 2000 units CAPS Take 2,000 Units by mouth daily.   . clopidogrel (PLAVIX) 75 MG tablet Take 1 tablet (75 mg total) by mouth daily.   . COD LIVER OIL PO Take 415 mg by mouth daily.   . colchicine 0.6 MG tablet Take 1 tablet (0.6 mg total) by mouth daily.   Marland Kitchen CRANBERRY PO Take 1 capsule by mouth daily.   . diazepam (VALIUM) 5 MG tablet Take 5 mg by mouth daily as needed for anxiety.   . diphenhydrAMINE (BENADRYL) 50 MG capsule Take 50 mg by mouth at bedtime as needed for sleep.    Marland Kitchen exemestane (AROMASIN) 25 MG tablet TAKE 1 TABLET DAILY AFTER BREAKFAST   . Fish Oil-Krill Oil (KRILL OIL PLUS PO) Take 1 capsule by mouth daily.    Marland Kitchen gabapentin (NEURONTIN) 300 MG capsule Take 1 capsule (300 mg total) by mouth 2 (two) times daily. Take 1 during the day if needed and 2 at night 11/15/2017: Taking 2 at bedtime only   . insulin NPH Human (HUMULIN N,NOVOLIN N) 100 UNIT/ML injection Inject 15-18 Units into the skin 2 (two) times daily before a meal. 20 units am and 20 units PM 12/13/2017: Taking 20 units in am and 20 units in pm  . Iron-FA-B Cmp-C-Biot-Probiotic (FUSION PLUS)  CAPS takes 1 tablet daily   . Melatonin 5 MG CAPS Take 5 mg by mouth at bedtime as needed (sleep).    . Methylfol-Methylcob-Acetylcyst (CEREFOLIN NAC) 6-2-600 MG TABS TAKE 1 CAPLET BY MOUTH ONCE DAILY. 09/01/2017: 09/01/17 must come from [employer]  . metolazone (ZAROXOLYN) 2.5 MG tablet Take 30 min before the morning dose of bumex as directed daily. (Patient taking differently: Take 2.5 mg by mouth. Take 30 min before the morning dose of bumex as directed daily. Monday and Thursday)   . metoprolol succinate (TOPROL XL) 50 MG 24 hr tablet TAKE ONE-HALF (1/2) TABLET DAILY   . mirtazapine (REMERON) 15 MG tablet Take 15 mg by mouth at bedtime. 11/2 tablet  at bedtime   . oxyCODONE-acetaminophen (PERCOCET/ROXICET) 5-325 MG tablet Take 1 tablet by mouth every 4 (four) hours as needed.   . pantoprazole (PROTONIX) 40 MG tablet TAKE 1 TABLET DAILY (CONTACT OFFICE FOR ADDITIONAL REFILLS, FINAL ATTEMPT)   . potassium chloride SA (K-DUR,KLOR-CON) 20 MEQ tablet Take 40 mEq by mouth 2 (two) times daily.    . pramipexole (MIRAPEX) 0.25 MG tablet TAKE 1 TABLET AT 4:00 P.M. AND 2 TABLETS (0.5 MG) AT BEDTIME   . Resveratrol (RESERVAPAK PO) Take 1 tablet by mouth every evening.    . rosuvastatin (CRESTOR) 20 MG tablet Take 20 mg by mouth at bedtime.   Marland Kitchen Ubiquinol (QUNOL COQ10/UBIQUINOL/MEGA) 100 MG CAPS Take 1 tablet by mouth daily. CoQ10, Qunol    No facility-administered encounter medications on file as of 01/16/2018.     Functional Status:   In your present state of health, do you have any difficulty performing the following activities: 08/16/2017 08/02/2017  Hearing? N N  Vision? N N  Difficulty concentrating or making decisions? N N  Walking or climbing stairs? Y Y  Dressing or bathing? N N  Doing errands, shopping? N N  Comment husband helps -  Conservation officer, nature and eating ? N -  Using the Toilet? N -  In the past six months, have you accidently leaked urine? N -  Do you have problems with loss of bowel control? N -  Managing your Medications? Y -  Comment husband helps  -  Managing your Finances? N -  Housekeeping or managing your Housekeeping? N -  Some recent data might be hidden    Fall/Depression Screening:    Fall Risk  09/01/2017 08/23/2017 08/15/2017  Falls in the past year? Yes Yes Yes  Number falls in past yr: 2 or more 1 1  Injury with Fall? No No -  Risk for fall due to : Impaired balance/gait Impaired balance/gait;History of fall(s) History of fall(s);Impaired balance/gait;Impaired mobility  Follow up - Falls evaluation completed Falls evaluation completed   PHQ 2/9 Scores 08/23/2017 03/21/2017  PHQ - 2 Score 1 1    Assessment:   Routine home visit , husband present . Husband keeps a very thorough record of patient daily weights, blood sugar, and new concerns.   Heart failure - continuing daily weighs and recording,  today's weight 183 no sudden weight gain. denies shortness of breath.  Diabetes- Patient reports recent A1c result ,is still over  7, home blood sugar ranges have been 93- 225. Patient denies low blood sugar episodes, reports improvement with managing diet. Blood sugar this am 136. Patient wishes to know exact recent A1c result, placed call to Dr. Suzette Battiest office to inquire , able to leave a message for return call.  Right foot pain- follow up with  orthopedic on this week, concerned regarding  progress and next steps regarding left foot. Spouse to discuss brace causing irritation and blister on inner foot. Patient limiting mobility , husband supportive able to assist with house keeping and granddaughter helps patient with a shower. Compliant of hip discomfort since wearing support boot to right leg, patient to discuss concerns at  office visit on this week  Plan  Plan follow up call in the next 2 weeks  Will continue diabetes education and support .  Provided Diabetes education book.  Reinforced Heart education  Fall safety precautions reviewed.   THN CM Care Plan Problem One     Most Recent Value  Care Plan Problem One  Recent hospital admission related to Heart failure   Role Documenting the Problem One  Care Management Coordinator  Care Plan for Problem One  Not Active  THN Long Term Goal   Patient will not experience a hospital admission in the next 90 days   THN Long Term Goal Start Date  08/16/17  [facility] Long Term Goal Met Date  11/15/17  THN CM Short Term Goal #1   Over the next 30 days patient with continue to weigh daily and keep a record   THN CM Short Term Goal #1 Start Date  08/17/17  [facility] CM Short Term Goal #1 Met Date  09/01/17  THN CM Short Term Goal #2   Over the next 30 days patient will be able  to report at 3 symptoms of heart failure and action plan zone   [facility] CM Short Term Goal #2 Start Date  08/17/17  [facility] CM Short Term Goal #2 Met Date  09/06/17  THN CM Short Term Goal #3  Over the next 30 days patient will be able to report decreased swelling in legs   THN CM Short Term Goal #3 Start Date  08/30/17  [facility] CM Short Term Goal #3 Met Date  10/06/17 [late entry for met date ]  [facility] CM Short Term Goal #4  Patient will be able to report increased knowledge of low salt meal planning diet over the next 30 days   THN CM Short Term Goal #4 Start Date  10/06/17  [facility] CM Short Term Goal #4 Met Date  11/15/17    [facility] CM Care Plan Problem Two     Most Recent Value  Care Plan Problem Two  Knowledge related to Diabetes, new to insulin   Role Documenting the Problem Two  Care Management Coordinator  Care Plan for Problem Two  Active  Interventions for Problem Two Long Term Goal   Reinforced continued documention of blood sugars, provided Diabetes book, review of carbohydrate choices and portion size, review of plate method.   THN Long Term Goal  Patient will be able to report increase in knowledge of Diabetes self care managment over the next 90 days  [goal extended. ]  THN Long Term Goal Start Date  11/15/17  [facility] CM Short Term Goal #1   Patient will report increased knowledge of snack food options over the next 30 days   THN CM Short Term Goal #1 Start Date  11/15/17  [facility] CM Short Term Goal #1 Met Date   01/16/18  THN CM Short Term Goal #2   Patient will report participating in self administering insulin over the next 30 days   [facility] CM Short Term Goal #2 Start Date  11/15/17      [name], RN, [place] Management  Coordinator  [phone]- Mobile [phone]- [place]

## 2018-01-18 ENCOUNTER — Other Ambulatory Visit: Payer: Self-pay | Admitting: Cardiology

## 2018-01-19 ENCOUNTER — Ambulatory Visit (INDEPENDENT_AMBULATORY_CARE_PROVIDER_SITE_OTHER): Payer: Medicare Other | Admitting: Orthopedic Surgery

## 2018-01-19 ENCOUNTER — Other Ambulatory Visit (INDEPENDENT_AMBULATORY_CARE_PROVIDER_SITE_OTHER): Payer: Self-pay

## 2018-01-19 ENCOUNTER — Encounter (INDEPENDENT_AMBULATORY_CARE_PROVIDER_SITE_OTHER): Payer: Self-pay | Admitting: Physician Assistant

## 2018-01-19 VITALS — Ht 66.0 in | Wt 183.0 lb

## 2018-01-19 DIAGNOSIS — Z9861 Coronary angioplasty status: Secondary | ICD-10-CM

## 2018-01-19 DIAGNOSIS — M1A0720 Idiopathic chronic gout, left ankle and foot, without tophus (tophi): Secondary | ICD-10-CM

## 2018-01-19 DIAGNOSIS — I2510 Atherosclerotic heart disease of native coronary artery without angina pectoris: Secondary | ICD-10-CM

## 2018-01-19 DIAGNOSIS — M25571 Pain in right ankle and joints of right foot: Secondary | ICD-10-CM | POA: Diagnosis not present

## 2018-01-19 MED ORDER — COLCHICINE 0.6 MG PO TABS: 0.6000 mg | ORAL_TABLET | Freq: Every day | ORAL | 3 refills | Status: DC

## 2018-01-20 ENCOUNTER — Encounter (INDEPENDENT_AMBULATORY_CARE_PROVIDER_SITE_OTHER): Payer: Self-pay | Admitting: Orthopedic Surgery

## 2018-01-20 DIAGNOSIS — Z170 Estrogen receptor positive status [ER+]: Secondary | ICD-10-CM | POA: Diagnosis not present

## 2018-01-20 DIAGNOSIS — Z7901 Long term (current) use of anticoagulants: Secondary | ICD-10-CM | POA: Diagnosis not present

## 2018-01-20 DIAGNOSIS — C50912 Malignant neoplasm of unspecified site of left female breast: Secondary | ICD-10-CM | POA: Diagnosis not present

## 2018-01-20 DIAGNOSIS — C50911 Malignant neoplasm of unspecified site of right female breast: Secondary | ICD-10-CM | POA: Diagnosis not present

## 2018-01-20 NOTE — Progress Notes (Signed)
Office Visit Note   Patient: [name]           Date of Birth: 13-Dec-1940           MRN: [id number] Visit Date: 01/19/2018              Requested by: Redmond [address], [address] [place], [place] [address] PCP: [place] School, MD  Chief Complaint  Patient presents with  . Right Ankle - Pain    Uric acid 8.7 12/22/17      HPI: Patient is a 77 year old woman who presents in follow-up complaining of bilateral foot pain.  She states she has stage IV kidney disease she states that she hurts all over her foot globally including the foot ankle and calf.  She is on allopurinol 100 mg twice a day she states she has not been taking the colchicine.  Assessment & Plan: Visit Diagnoses:  1. Pain in right ankle and joints of right foot   2. Chronic idiopathic gout involving toe of left foot without tophus     Plan: Recommend that she take the colchicine once a day.  Her last uric acid was 8.7.  Uric acid level was drawn today we will revise  her medicines as needed and repeat uric acid follow-up.  Recommended compression stockings.  Follow-Up Instructions: Return in about 4 weeks (around 02/16/2018).   Ortho Exam  Patient is alert, oriented, no adenopathy, well-dressed, normal affect, normal respiratory effort. Examination patient is globally tender around the foot ankle and calf to palpation there is no redness no cellulitis there is no pain with attempted passive range of motion.  Patient does have a palpable soft tissue mass along the course of the greater saphenous vein at the ankle.  Patient is on Plavix for anticoagulation.  Her calf is 39 cm in circumference.  Imaging: No results found. No images are attached to the encounter.  Labs: Lab Results  Component Value Date   HGBA1C 5.7 (H) 12/07/2016   HGBA1C 5.8 (H) 05/06/2016   HGBA1C 6.0 (H) 11/17/2015   LABURIC 8.7 (H) 12/22/2017   LABURIC 14.4 (H) 11/24/2017   LABURIC 7.9 (H) 11/11/2016   REPTSTATUS 11/18/2015  FINAL 11/17/2015   CULT (A) 11/17/2015    50,000 COLONIES/mL LACTOBACILLUS SPECIES Standardized susceptibility testing for this organism is not available.    LABORGA ESCHERICHIA COLI 12/31/2009     Lab Results  Component Value Date   ALBUMIN 3.8 08/15/2017   ALBUMIN 4.7 08/01/2017   ALBUMIN 3.7 04/12/2017   LABURIC 8.7 (H) 12/22/2017   LABURIC 14.4 (H) 11/24/2017   LABURIC 7.9 (H) 11/11/2016    Body mass index is 29.54 kg/m.  Orders:  Orders Placed This Encounter  Procedures  . Uric acid   No orders of the defined types were placed in this encounter.    Procedures: No procedures performed  Clinical Data: No additional findings.  ROS:  All other systems negative, except as noted in the HPI. Review of Systems  Objective: Vital Signs: Ht 5' 6" (1.676 m)   Wt 183 lb (83 kg)   BMI 29.54 kg/m   Specialty Comments:  No specialty comments available.  PMFS History: Patient Active Problem List   Diagnosis Date Noted  . Right atrial mass   . Chronic diastolic heart failure ([place]) 08/01/2017  . Genetic testing 12/03/2016  . 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 Malignant neoplasm of upper-outer quadrant of left breast in female, estrogen receptor positive ([place]) 11/19/2016  .  Chronic idiopathic gout involving toe of left foot without tophus 11/15/2016  . Malignant 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, estrogen receptor positive ([place]) 11/09/2016  . Tendon tear, ankle, left, sequela   . Traumatic rupture of left anterior tibial tendon 04/20/2016  . Acute on chronic kidney failure ([place])   . 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 Headache, migraine   . Unexplained weight loss 11/17/2015  . CKD (chronic kidney disease) stage 3, GFR 30-59 ml/min (HCC) 11/17/2015  . Restless legs syndrome 07/24/2015  . Mild cognitive impairment 01/08/2015  . Abnormality of gait 01/08/2015  . Dizziness 12/19/2014  . Preoperative  cardiovascular examination 12/11/2013  . GERD (gastroesophageal reflux disease) 10/06/2013  . Syncope 10/05/2013  . Dementia ([place]) 12/06/2012  . Anxiety 10/28/2012    Class: Acute  . Bilateral lower extremity edema 10/28/2012  . CAD S/P percutaneous coronary angioplasty   . Essential hypertension   . Dyslipidemia, goal LDL below 70    Past Medical History:  Diagnosis Date  . Anxiety   . Arthritis     osteoarthritis  . Breast cancer ([place])   . Breast cancer in female [facility])    Bilateral  . CAD S/P percutaneous coronary angioplasty 2007; March 2011   [employer] 3.0 mm 20 mm postdilated 3.6 mm in early mid LAD; status post ISR Cutting Balloon PTCA and March 11 along with PCI of distal mid lesion with a 3.0 mm 12 mm MultiLink vision BMS; the proximal stent causes jailing of SP1 and SP2 with ostial 70-80% lesions  . Cancer (HCC)    Melanoma, Squamous cell Carcinoma  . CHF (congestive heart failure) ([place])   . Chronic kidney disease    stage 2   . Dementia ([place])    mild  . Diabetes mellitus type 2 with neurological manifestations ([place])   . Dyslipidemia, goal LDL below 70   . Full dentures   . Genetic testing 12/03/2016   Germline genetic testing was performed through [employer]'s Common Hereditary Cancers Panel + [employer]'s Melanoma Panel. This custom panel includes analysis of the following 51 genes: APC, ATM, AXIN2, BAP1, BARD1, BMPR1A, BRCA1, BRCA2, BRIP1, CDH1, CDK4, CDKN2A, CHEK2, CTNNA1, DICER1, EPCAM, GREM1, HOXB13, KIT, MEN1, MITF, MLH1, MSH2, MSH3, MSH6, MUTYH, NBN, NF1, NTHL1, PALB2, PDGFRA, PMS2, POLD1, POL  . GERD (gastroesophageal reflux disease)   . Headache(784.0)   . History of hematuria    Followed by Dr. Gaynelle Arabian  . Hypertension, essential, benign   . Nausea & vomiting 10/2015  . Peripheral neuropathy   . Stroke ([place])   . TIA (transient ischemic attack)    multiple in the past  . Wears glasses     Family History  Problem Relation Age of Onset  . Diabetes Mother     . Hypertension Mother   . Kidney disease Mother   . Hypertension Father   . Emphysema Father   . Heart attack Father   . Heart disease Father   . Arthritis Sister   . Diabetes Sister   . Hypertension Sister   . Breast cancer Sister 19       treated with mastectomy  . Cervical cancer Sister 74  . Hypertension Brother   . Hyperlipidemia Brother   . Diabetes Brother   . Stroke Brother   . Diabetes Sister   . Hypertension Sister   . Stomach cancer Sister 71  . Hypertension Brother   . ALS Brother  d.62s  . Breast cancer Daughter 87       triple negative treated with lumpectomy, chemo, radiation  . Heart attack Daughter 24       d.45  . COPD Daughter   . Cancer Paternal Aunt        unspecified type  . Cancer Paternal Uncle        unspecified type  . Cancer Paternal Uncle        unspecified type    Past Surgical History:  Procedure Laterality Date  . ABDOMINAL HYSTERECTOMY    . APPENDECTOMY    . BLADDER SUSPENSION    . BREAST LUMPECTOMY Bilateral 2018  . BREAST LUMPECTOMY WITH RADIOACTIVE SEED AND SENTINEL LYMPH NODE BIOPSY Bilateral 12/14/2016   Procedure: BILATERAL BREAST LUMPECTOMY WITH BILATERAL  RADIOACTIVE SEED AND BILATERAL AXILLARY  SENTINEL LYMPH NODE BIOPSY ERAS PATHWAY;  Surgeon: Alphonsa Overall, MD;  Location: [place];  Service: General;  Laterality: Bilateral;  PECTORAL BLOCK  . CARDIAC CATHETERIZATION  02/24/2006   85% stenosis in the proximal portion of LAD-arrangements made for PCI on 02/25/2006  . CARDIAC CATHETERIZATION  02/25/2006   80% LAD lesion stented with a 3x25m Liberte stent resulting in reduction of 80% lesion to 0% residual  . CARDIAC CATHETERIZATION  04/26/2006   Medical management  . CARDIAC CATHETERIZATION  06/24/2009   60-70% re-stenosis in the proximal LAD. A 3.25x15 cutting balloon, 3 inflations - 14atm-38sec, 13atm-39sec, and 12atm-40sec reduced to less than 10%. 60% stenosis of the mid/distal LAD stented with a 3x163mMultilink stent.   . Marland KitchenARDIAC CATHETERIZATION  07/09/2009   Medical management  . CAROTID DOPPLER  06/24/2009   40-59% R ICA stenosis. No significant ICA stenosis noted  . CHOLECYSTECTOMY    . DILATION AND CURETTAGE OF UTERUS    . JOINT REPLACEMENT Left   . LESION REMOVAL Right 03/11/2015   Procedure:  EXCISION OF RIGHT PRE TIBIAL LESION;  Surgeon: JaJudeth HornMD;  [place] [facility] Service: General;  Laterality: Right;  . MASS EXCISION Left 06/10/2014   Procedure: EXCISION LEFT LOWER LEG LESION;  Surgeon: JaDoreen SalvageMD;  [facility] Service: General;  Laterality: Left;  . Marland KitchenASS EXCISION Left 09/05/2015   Procedure: EXCISION OF LEFT FOREARM  MASS;  Surgeon: JaJudeth HornMD;  [place] [facility] Service: General;  Laterality: Left;  . NM MYOVIEW LTD  11/16/2011   5 beats a PVC during recovery.  No skin or infarction.  Reached 5 METs, EKG negative for ischemia   . NM MYOVIEW LTD  10/2011   No ischemia or infarction.  Normal EF.  LOW RISK. (Short bursts of 5 beats NSVT during recovery otherwise normal)  . SHOULDER ARTHROSCOPY  10/15  . SHOULDER ARTHROSCOPY  1995   left  . TEE WITHOUT CARDIOVERSION N/A 08/03/2017   Procedure: TRANSESOPHAGEAL ECHOCARDIOGRAM (TEE);  Surgeon: RaSkeet LatchMD;  [place] [facility] Service: Cardiovascular;; EF 60-65% with no wall motion normalities.  No atrial thrombus noted.  Lightly findings consistent with a lipomatous hypertrophy of the atrial septum.   . TENDON REPAIR Left 05/12/2016   Procedure: Left Anterior Tibial Tendon Reconstruction;  Surgeon: MaNewt MinionMD;  [place] [facility] Service: Orthopedics;  Laterality: Left;  . TOTAL KNEE ARTHROPLASTY  2005   left  . TRANSESOPHAGEAL ECHOCARDIOGRAM  08/03/2016   : EF 60-65% no wall motion normalities.  No atrial thrombus.  Lipomatous hypertrophy of the atrial septum.  No  mass noted.  . TRANSTHORACIC ECHOCARDIOGRAM  10/2015   EF 55-60 %. Mild LVH. Mild  pulmonary hypertension. Normal valves.  . TRANSTHORACIC ECHOCARDIOGRAM  07/2017   Vigorous LV function.  EF 65-70%.  Mild diastolic dysfunction with no RWMA. GR 1 DD.  Mild aortic sclerosis/no stenosis.  Questionable right atrial mass discounted with TEE)   . WRIST ARTHROPLASTY  2010   cancer lt wrist   Social History   Occupational History  . Occupation: retired    Fish farm manager: RETIRED  Tobacco Use  . Smoking status: Never Smoker  . Smokeless tobacco: Never Used  Substance and Sexual Activity  . Alcohol use: No  . Drug use: No  . Sexual activity: Never

## 2018-01-21 LAB — URIC ACID: Uric Acid, Serum: 9.1 mg/dL — ABNORMAL HIGH (ref 2.5–7.0)

## 2018-01-23 ENCOUNTER — Telehealth (INDEPENDENT_AMBULATORY_CARE_PROVIDER_SITE_OTHER): Payer: Self-pay

## 2018-01-23 NOTE — Telephone Encounter (Signed)
-----   Message from Newt Minion, MD sent at 01/23/2018  8:58 AM EDT ----- Uric acid 9.1, need to increase her meds, would increase allopurinol to 200mg  bid

## 2018-01-23 NOTE — Telephone Encounter (Signed)
Called pt to advise. Will call with any questions.

## 2018-01-30 ENCOUNTER — Other Ambulatory Visit: Payer: Self-pay | Admitting: *Deleted

## 2018-01-30 NOTE — Patient Outreach (Signed)
[facility]) Care Management  01/30/2018  DEAJA RIZO 1940-06-20 [id number]    Telephone follow up call  Patient with hospital admission 5/6-5/8, Dx Acute on Chronic heart failure   PMH: includes but not limited to CKD, Diabetes, HTN, Heart Failure, breast cancer, gout elevated uric acid level    Unsuccessful outreach call to patient on home and mobile number, no answer able to leave a HIPAA compliant message for return call.    Plan Will await return call and if no response, Will plan call attempt #2 in the next 4 business days    [name], RN, [place] Management Coordinator  [phone]- Mobile [phone]- [place]

## 2018-02-02 ENCOUNTER — Other Ambulatory Visit: Payer: Self-pay | Admitting: *Deleted

## 2018-02-02 NOTE — Patient Outreach (Signed)
[facility]) Care Management  02/02/2018  GENELLA BAS 03/21/1941 [id number]  Telephone assessment  Unsuccessful return call to patient, patient left return call voicemail message from outreach on 11/4.    Plan  Will await return call ,  If no response will plan return call in next 4 business days, will send unsuccessful outreach letter.       [name], RN, [place] Management Coordinator  [phone]- Mobile [phone]- Toll Free Main Office

## 2018-02-07 ENCOUNTER — Other Ambulatory Visit: Payer: Self-pay | Admitting: *Deleted

## 2018-02-07 NOTE — Patient Outreach (Signed)
[facility]) Care Management  [place]  02/07/2018   [name] [birth year] [id number]  Patient with hospital admission 5/6-5/8, Dx Acute on Chronic heart failure PMH: includes but not limited to CKD, Diabetes, HTN, Heart Failure, breast cancer, gout elevated uric acid level  Right ankle pain, chronic idiopathic gout,   Subjective:   Patient report that she is holding her own, she reports using a cane now. Patient discussed having a second fall in the last 2 weeks. Patient states she was not dizzy when she fell, one fall was related to having on slippery socks, and the other fall she trips on container of water in the kitchen. Patient discussed the boot she wears on her right foot is heavy.  Patient states she doesn't feel like she hurt herself but, she is being more careful and will follow up with Dr.Duda at scheduled visit on next week. Patient states that she did not notify MD of her recent falls.     Encounter Medications:  Outpatient Encounter Medications as of 02/07/2018  Medication Sig Note  . allopurinol (ZYLOPRIM) 100 MG tablet Take 1 tablet (100 mg total) by mouth 2 (two) times daily.   Marland Kitchen allopurinol (ZYLOPRIM) 100 MG tablet Take 1 tablet (100 mg total) by mouth 2 (two) times daily.   . bumetanide (BUMEX) 2 MG tablet Take 2 mg by mouth 2 (two) times daily. Patient currently taking 2 tablets in am and 1 in pm   . Calcium Carb-Cholecalciferol (CALCIUM 600 + D PO) Take 1 tablet by mouth at bedtime.    . Cholecalciferol 2000 units CAPS Take 2,000 Units by mouth daily.   . clopidogrel (PLAVIX) 75 MG tablet TAKE 1 TABLET DAILY   . COD LIVER OIL PO Take 415 mg by mouth daily.   . colchicine 0.6 MG tablet Take 1 tablet (0.6 mg total) by mouth daily. PRN gout flare   . CRANBERRY PO Take 1 capsule by mouth daily.   . diazepam (VALIUM) 5 MG tablet Take 5 mg by mouth daily as needed for anxiety.   . diphenhydrAMINE (BENADRYL) 50 MG capsule Take 50 mg by  mouth at bedtime as needed for sleep.    Marland Kitchen exemestane (AROMASIN) 25 MG tablet TAKE 1 TABLET DAILY AFTER BREAKFAST   . Fish Oil-Krill Oil (KRILL OIL PLUS PO) Take 1 capsule by mouth daily.    Marland Kitchen gabapentin (NEURONTIN) 300 MG capsule Take 1 capsule (300 mg total) by mouth 2 (two) times daily. Take 1 during the day if needed and 2 at night (Patient taking differently: Take 300 mg by mouth 3 (three) times daily. Taking one 3 times a day)   . insulin NPH Human (HUMULIN N,NOVOLIN N) 100 UNIT/ML injection Inject 20-25 Units into the skin 2 (two) times daily before a meal. 20 units am and 25 units PM 01/16/2018: 20 units in am and 25 in pm   . Iron-FA-B Cmp-C-Biot-Probiotic (FUSION PLUS) CAPS takes 1 tablet daily   . Melatonin 5 MG CAPS Take 5 mg by mouth at bedtime as needed (sleep).    . Methylfol-Methylcob-Acetylcyst (CEREFOLIN NAC) 6-2-600 MG TABS TAKE 1 CAPLET BY MOUTH ONCE DAILY. 09/01/2017: 09/01/17 must come from [employer]  . metolazone (ZAROXOLYN) 2.5 MG tablet Take 30 min before the morning dose of bumex as directed daily. (Patient taking differently: Take 2.5 mg by mouth. Take 30 min before the morning dose of bumex as directed daily. Monday and Thursday)   . metoprolol succinate (TOPROL  XL) 50 MG 24 hr tablet TAKE ONE-HALF (1/2) TABLET DAILY   . mirtazapine (REMERON) 15 MG tablet Take 15 mg by mouth at bedtime. 11/2 tablet at bedtime   . oxyCODONE-acetaminophen (PERCOCET/ROXICET) 5-325 MG tablet Take 1 tablet by mouth every 4 (four) hours as needed.   . pantoprazole (PROTONIX) 40 MG tablet TAKE 1 TABLET DAILY (CONTACT OFFICE FOR ADDITIONAL REFILLS, FINAL ATTEMPT)   . potassium chloride SA (K-DUR,KLOR-CON) 20 MEQ tablet Take 40 mEq by mouth 2 (two) times daily.    . pramipexole (MIRAPEX) 0.25 MG tablet TAKE 1 TABLET AT 4:00 P.M. AND 2 TABLETS (0.5 MG) AT BEDTIME   . Resveratrol (RESERVAPAK PO) Take 1 tablet by mouth every evening.    . rosuvastatin (CRESTOR) 20 MG tablet Take 20 mg by mouth at  bedtime.   Marland Kitchen Ubiquinol (QUNOL COQ10/UBIQUINOL/MEGA) 100 MG CAPS Take 1 tablet by mouth daily. CoQ10, Qunol    No facility-administered encounter medications on file as of 02/07/2018.     Functional Status:  In your present state of health, do you have any difficulty performing the following activities: 08/16/2017 08/02/2017  Hearing? N N  Vision? N N  Difficulty concentrating or making decisions? N N  Walking or climbing stairs? Y Y  Dressing or bathing? N N  Doing errands, shopping? N N  Comment husband helps -  Conservation officer, nature and eating ? N -  Using the Toilet? N -  In the past six months, have you accidently leaked urine? N -  Do you have problems with loss of bowel control? N -  Managing your Medications? Y -  Comment husband helps  -  Managing your Finances? N -  Housekeeping or managing your Housekeeping? N -  Some recent data might be hidden    Fall/Depression Screening: Fall Risk  09/01/2017 08/23/2017 08/15/2017  Falls in the past year? Yes Yes Yes  Number falls in past yr: 2 or more 1 1  Injury with Fall? No No -  Risk for fall due to : Impaired balance/gait Impaired balance/gait;History of fall(s) History of fall(s);Impaired balance/gait;Impaired mobility  Follow up - Falls evaluation completed Falls evaluation completed   PHQ 2/9 Scores 08/23/2017 03/21/2017  PHQ - 2 Score 1 1    Assessment:   Heart failure : patient weight up to 188, she believes that it is related to decreased activity due to right foot discomfort  . Denies shortness of breath , leg swelling remains down with some swelling at ankles, review of worsening symptoms of Heart failure yellow zone.  Diabetes  Patient reports blood sugars range has been 135 to 200, states she continues to follow a balanced diet , including protein in meals .  Patient recent A1c was 7.4 on 10/3. Patient denies having symptoms of low blood sugar episodes she is able to states symptoms of low blood sugar and how to treat,    Fall risk Aldean Jewett /right foot pain  High fall risk, continue to wear brace/book to right foot. 2 recent falls. Reports no acute injury , has orthopedic follow up on next week will discuss with MD. Will benefit from education on fall prevention. Patient reports not have shoes on just her footies, that may be slippery.     Plan:  Will continue to follow for complex care management of chronic condition, fall risk . Review plan of care with patient centered goals .  Will send EMMI education on preventing falls in the elderly.  Will plan follow up call  in the next 2 weeks.    THN CM Care Plan Problem One     Most Recent Value  Care Plan Problem One  Knowledge deficient  related to self care management of Heart failure   Role Documenting the Problem One  Care Management [place] for Problem One  Active  [facility] Long Term Goal   Patient will verbalize plan of action plan for yellow zone in the next 31 days  [facility] ]  THN Long Term Goal Start Date  02/07/18  Interventions for Problem One Long Term Goal  Reviewed patient importance of notifying MD  of worsening symptom and stressed importance of notifying MD of worsening of symptoms to avoid hospital admisison   [facility] CM Short Term Goal #1   Patient will continue to weigh daily and keep a record over the next 28 days   THN CM Short Term Goal #1 Start Date  02/07/18  Interventions for Short Term Goal #1  Discussed importance of keeping track of weighs, continue to keep a record , reviewed best time of day to weigh.   THN CM Short Term Goal #2   Over the next 30 days patient will be able to verbalize 2 symptoms of CHF worsening symptoms   THN CM Short Term Goal #2 Start Date  02/07/18  Interventions for Short Term Goal #2  Reviewed yellow zone symptoms of increased shortness of breath , swelling, weight gain of 3 pounds in a day and 5 in a week .     THN CM Care Plan Problem Two     Most Recent Value  Care Plan for Problem Two   Active  Interventions for Problem Two Long Term Goal   Review of current clinical state, discussed with patient recent blood sugar reading , and teachback review how to treat low blood sugar.   THN Long Term Goal  Patient will be able to report increase in knowledge of Diabetes self care managment over the next 90 days   THN Long Term Goal Start Date  11/15/17  [facility] CM Short Term Goal #1   Patient will report increased knowledge of snack food options over the next 30 days   THN CM Short Term Goal #1 Start Date  11/15/17  [facility] CM Short Term Goal #1 Met Date   01/16/18    [facility] CM Care Plan Problem Three     Most Recent Value  Care Plan Problem Three  High fall risk related to recent report of falls   Role Documenting the Problem Three  Care Management Coordinator  Care Plan for Problem Three  Active  THN CM Short Term Goal #1   Patient will verbalize not falls in the next 30 days   THN CM Short Term Goal #1 Start Date  02/07/18  Interventions for Short Term Goal #1  Discussed with patient fall prevention measures, wearing supportive foot wear, non slip socks, changing positions slowly when rising , stand a few seconds before starting to walk., discussed keeping frequent used items nearby.   THN CM Short Term Goal #2   Patient will be able to reports 2 actions to take after having a fall over the next 28 days   THN CM Short Term Goal #2 Start Date  02/07/18  Interventions for Short Term Goal #2  Discussed actions to take after having a fall, call for assist up if needed, notify MD of injury, discomfort related to fall, seek medical attention,. Discussed  using sturdy futuniture to use for getting up from the floor as able.       [name], RN, [place] Management Coordinator  [phone]- Mobile [phone]- Toll Free Main Office

## 2018-02-13 ENCOUNTER — Inpatient Hospital Stay: Payer: Medicare Other

## 2018-02-13 ENCOUNTER — Inpatient Hospital Stay: Payer: Medicare Other | Attending: Hematology | Admitting: Hematology

## 2018-02-13 ENCOUNTER — Encounter: Payer: Self-pay | Admitting: Hematology

## 2018-02-13 VITALS — BP 132/75 | HR 95 | Temp 97.7°F | Resp 18 | Ht 66.0 in | Wt 189.3 lb

## 2018-02-13 DIAGNOSIS — Z923 Personal history of irradiation: Secondary | ICD-10-CM | POA: Insufficient documentation

## 2018-02-13 DIAGNOSIS — Z79899 Other long term (current) drug therapy: Secondary | ICD-10-CM | POA: Insufficient documentation

## 2018-02-13 DIAGNOSIS — I509 Heart failure, unspecified: Secondary | ICD-10-CM | POA: Diagnosis not present

## 2018-02-13 DIAGNOSIS — Z79811 Long term (current) use of aromatase inhibitors: Secondary | ICD-10-CM | POA: Insufficient documentation

## 2018-02-13 DIAGNOSIS — E785 Hyperlipidemia, unspecified: Secondary | ICD-10-CM | POA: Insufficient documentation

## 2018-02-13 DIAGNOSIS — G629 Polyneuropathy, unspecified: Secondary | ICD-10-CM | POA: Diagnosis not present

## 2018-02-13 DIAGNOSIS — Z90722 Acquired absence of ovaries, bilateral: Secondary | ICD-10-CM | POA: Insufficient documentation

## 2018-02-13 DIAGNOSIS — Z800 Family history of malignant neoplasm of digestive organs: Secondary | ICD-10-CM | POA: Diagnosis not present

## 2018-02-13 DIAGNOSIS — C50211 Malignant neoplasm of upper-inner quadrant of right female breast: Secondary | ICD-10-CM | POA: Insufficient documentation

## 2018-02-13 DIAGNOSIS — Z85820 Personal history of malignant melanoma of skin: Secondary | ICD-10-CM | POA: Diagnosis not present

## 2018-02-13 DIAGNOSIS — C50412 Malignant neoplasm of upper-outer quadrant of left female breast: Secondary | ICD-10-CM | POA: Insufficient documentation

## 2018-02-13 DIAGNOSIS — N184 Chronic kidney disease, stage 4 (severe): Secondary | ICD-10-CM | POA: Insufficient documentation

## 2018-02-13 DIAGNOSIS — I129 Hypertensive chronic kidney disease with stage 1 through stage 4 chronic kidney disease, or unspecified chronic kidney disease: Secondary | ICD-10-CM

## 2018-02-13 DIAGNOSIS — F419 Anxiety disorder, unspecified: Secondary | ICD-10-CM | POA: Diagnosis not present

## 2018-02-13 DIAGNOSIS — E1165 Type 2 diabetes mellitus with hyperglycemia: Secondary | ICD-10-CM

## 2018-02-13 DIAGNOSIS — I2510 Atherosclerotic heart disease of native coronary artery without angina pectoris: Secondary | ICD-10-CM | POA: Insufficient documentation

## 2018-02-13 DIAGNOSIS — E1140 Type 2 diabetes mellitus with diabetic neuropathy, unspecified: Secondary | ICD-10-CM | POA: Insufficient documentation

## 2018-02-13 DIAGNOSIS — M129 Arthropathy, unspecified: Secondary | ICD-10-CM | POA: Diagnosis not present

## 2018-02-13 DIAGNOSIS — R600 Localized edema: Secondary | ICD-10-CM | POA: Diagnosis not present

## 2018-02-13 DIAGNOSIS — Z803 Family history of malignant neoplasm of breast: Secondary | ICD-10-CM | POA: Diagnosis not present

## 2018-02-13 DIAGNOSIS — Z170 Estrogen receptor positive status [ER+]: Secondary | ICD-10-CM | POA: Diagnosis not present

## 2018-02-13 DIAGNOSIS — E118 Type 2 diabetes mellitus with unspecified complications: Secondary | ICD-10-CM

## 2018-02-13 DIAGNOSIS — IMO0002 Reserved for concepts with insufficient information to code with codable children: Secondary | ICD-10-CM

## 2018-02-13 DIAGNOSIS — Z90710 Acquired absence of both cervix and uterus: Secondary | ICD-10-CM | POA: Insufficient documentation

## 2018-02-13 LAB — CMP (CANCER CENTER ONLY)
ALT: 20 U/L (ref 0–44)
AST: 22 U/L (ref 15–41)
Albumin: 3.8 g/dL (ref 3.5–5.0)
Alkaline Phosphatase: 104 U/L (ref 38–126)
Anion gap: 12 (ref 5–15)
BUN: 47 mg/dL — ABNORMAL HIGH (ref 8–23)
CO2: 32 mmol/L (ref 22–32)
Calcium: 10.2 mg/dL (ref 8.9–10.3)
Chloride: 100 mmol/L (ref 98–111)
Creatinine: 2.45 mg/dL — ABNORMAL HIGH (ref 0.44–1.00)
GFR, Est AFR Am: 20 mL/min — ABNORMAL LOW (ref >60)
GFR, Estimated: 18 mL/min — ABNORMAL LOW (ref >60)
Glucose, Bld: 157 mg/dL — ABNORMAL HIGH (ref 70–99)
Potassium: 3.6 mmol/L (ref 3.5–5.1)
Sodium: 144 mmol/L (ref 135–145)
Total Bilirubin: 0.4 mg/dL (ref 0.3–1.2)
Total Protein: 7.4 g/dL (ref 6.5–8.1)

## 2018-02-13 LAB — CBC WITH DIFFERENTIAL (CANCER CENTER ONLY)
Abs Immature Granulocytes: 0.03 10*3/uL (ref 0.00–0.07)
Basophils Absolute: 0.1 10*3/uL (ref 0.0–0.1)
Basophils Relative: 1 %
Eosinophils Absolute: 0.3 10*3/uL (ref 0.0–0.5)
Eosinophils Relative: 3 %
HCT: 39.2 % (ref 36.0–46.0)
Hemoglobin: 12.6 g/dL (ref 12.0–15.0)
Immature Granulocytes: 0 %
Lymphocytes Relative: 21 %
Lymphs Abs: 1.5 10*3/uL (ref 0.7–4.0)
MCH: 30.1 pg (ref 26.0–34.0)
MCHC: 32.1 g/dL (ref 30.0–36.0)
MCV: 93.8 fL (ref 80.0–100.0)
Monocytes Absolute: 0.6 10*3/uL (ref 0.1–1.0)
Monocytes Relative: 8 %
Neutro Abs: 5 10*3/uL (ref 1.7–7.7)
Neutrophils Relative %: 67 %
Platelet Count: 195 10*3/uL (ref 150–400)
RBC: 4.18 MIL/uL (ref 3.87–5.11)
RDW: 17.4 % — ABNORMAL HIGH (ref 11.5–15.5)
WBC Count: 7.4 10*3/uL (ref 4.0–10.5)
nRBC: 0 % (ref 0.0–0.2)

## 2018-02-13 NOTE — Progress Notes (Signed)
[name]  [phone] [phone]  Clinic Follow Up Note   Patient Care Team: Kristen School, MD as PCP - General (Internal Medicine) Kristen Man, MD as PCP - Cardiology (Cardiology) Kristen Shiley, MD as Consulting Physician (Nephrology) Kristen Man, MD as Consulting Physician (Cardiology) Kristen Overall, MD as Consulting Physician (General Surgery) Kristen Rudd, MD as Consulting Physician (Radiation Oncology) Kristen Merle, MD as Consulting Physician (Hematology) [name], Kristen Massed, NP as Nurse Practitioner (Hematology and Oncology) Kristen Feller, RN as [facility]   Date of Service:  02/13/2018   CHIEF COMPLAINTS:  F/u for Bilateral invasive breast cancer  Oncology History   Cancer Staging Malignant 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, estrogen receptor positive ([place]) Staging form: Breast, AJCC 8th Edition - Clinical: Stage IA (cT1c, cN0, cM0, G1, ER: Positive, PR: Positive, HER2: Negative) - Unsigned       Malignant 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, estrogen receptor positive ([place])   10/20/2016 Initial Biopsy    Diagnosis Breast, right, needle core biopsy, 11:30 o'clock - INVASIVE DUCTAL CARCINOMA,    10/20/2016 Initial Diagnosis    Malignant 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, estrogen receptor positive ([place])    10/20/2016 Receptors her2    Estrogen Receptor: 100%, POSITIVE, STRONG STAINING INTENSITY Progesterone Receptor: 100%, POSITIVE, STRONG STAINING INTENSITY Proliferation Marker Ki67: 5% HER2 NEGATIVE     10/20/2016 Mammogram    Diagnostic mammogram showed persistent distortion corresponds to a vaguely palpable irregular mass and is suspicious for malignancy.Targeted ultrasound is performed, showing an irregular hypoechoic mass with posterior acoustic shadowing in the 11:30 o'clock location of the right breast  4 cm from the nipple which measures 0.9 x 0.9 x0.8 cm. There is associated internal vascularity. Evaluation of the axilla is negative for adenopathy.    11/01/2016 Imaging    B/l BREAST MRI: IMPRESSION: 1. There is a 1.5 cm biopsy proven malignancy in the upper slightly outer quadrant of the right breast. No additional sites of disease are identified.  2. There is an indeterminate 6 mm enhancing mass in the upper-outer left breast.    11/11/2016 Pathology Results    Diagnosis Breast, left, needle core biopsy, upper outer - INVASIVE DUCTAL CARCINOMA. - ATYPICAL LOBULAR HYPERPLASIA. - FIBROCYSTIC CHANGES AND PSEUDOANGIOMATOUS STROMAL HYPERPLASIA ([place]).    11/11/2016 Receptors her2     Estrogen Receptor: 100%, POSITIVE, STRONG STAINING INTENSITY Progesterone Receptor: 10%, POSITIVE, WEAK STAINING INTENSITY Proliferation Marker Ki67: 2% HER2 NEGATIVE     12/03/2016 Genetic Testing    Germline genetic testing was performed through [employer]'s Common Hereditary Cancers Panel + [facility]'s Melanoma Panel. This custom panel includes analysis of the following 51 genes: APC, ATM, AXIN2, BAP1, BARD1, BMPR1A, BRCA1, BRCA2, BRIP1, CDH1, CDK4, CDKN2A, CHEK2, CTNNA1, DICER1, EPCAM, GREM1, HOXB13, KIT, MEN1, MITF, MLH1, MSH2, MSH3, MSH6, MUTYH, NBN, NF1, NTHL1, PALB2, PDGFRA, PMS2, POLD1, POLE, POT1, PTEN, RAD50, RAD51C, RAD51D, RB1, SDHA, SDHB, SDHC, SDHD, SMAD4, SMARCA4, STK11, TP53, TSC1, TSC2, and VHL.  A variant of uncertain significance (VUS) was noted in BARD1.     12/14/2016 Miscellaneous    Oncotype Recurrence score of 16 10-year risk of distant recurrence with Tamoxifen alone is 10%    12/14/2016 Surgery    BILATERAL BREAST LUMPECTOMY WITH BILATERAL  RADIOACTIVE SEED AND BILATERAL AXILLARY  SENTINEL LYMPH NODE BIOPSY ERAS PATHWAY by Dr. Lucia [name] on 12/14/16    12/14/2016 Pathology Results    Diagnosis  12/14/16 1. Breast, lumpectomy, Left w/seed - INVASIVE DUCTAL CARCINOMA, 1 CM. - ATYPICAL  LOBULAR HYPERPLASIA ([facility]). - PREVIOUS BIOPSY SITE. - INVASIVE CARCINOMA 0.2 CM FROM LATERAL MARGIN. - ATYPICA LOBULAR HYPERPLASIA LESS THAN 0.1 CM FROM LATERAL MARGIN. 2. Breast, lumpectomy, Right w/seed - INVASIVE DUCTAL CARCINOMA, 1.1 CM. - FIBROCYSTIC CHANGES. - PREVIOUS BIOPSY SITE. - INVASIVE CARCINOMA 1.1 CM FROM POSTERIOR MARGIN. 3. Lymph node, sentinel, biopsy, Right Axillary Contents - BENIGN FIBROADIPOSE TISSUE. - NO LYMPH NODE TISSUE OR MALIGNANCY. 4. Lymph node, sentinel, biopsy, Left Axillary - ONE BENIGN LYMPH NODE (0/1).     01/17/2017 - 02/11/2017 Radiation Therapy    1. The Left breast was treated to 42.5 Gy in 17 fractions of 2.5 Gy.  2. The Right breast was treated to 42.5 Gy in 17 fractions of 2.5 Gy.  3. The Right breast was boosted to 7.5 Gy in 3 fractions of 2.5 Gy.  4. The Left breast was boosted to 7.5 Gy in 3 fractions of 2.5 Gy.        02/2017 -  Anti-estrogen oral therapy    Exemestane daily     HISTORY OF PRESENTING ILLNESS:  [name] 77 y.o. female is here because of newly diagnosed bilateral breast cancer. She reports to not feeling the lumps herself and cancer was found on screening mammogram followed by MR. 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 In August of 2018, she was also found to have invasive ductal carcinoma in the right breast as well. 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 She has a family history of breast cancer including her sister and daughter, both in their early 70s.   She began taking estrogen at 27 following a hysterectomy and bilateral salpingo-oophorectomy due to an ectopic pregnancy and ovarian cyst. 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   She is very active at home as she cleans her house and mows her own line. Per her husband, she is "very hyper and has trouble with her balance." She is otherwise well  at home.   CURRENT THERAPY:  Adjuvant Exemestane started on 03/07/17  INTERVAL HISTORY:   [name] is here for a follow up for her bilateral breast cancer. She presents to the clinic today accompanied by her husband.  She has had a significant pain in her right foot, for which she has been wearing special boots, which is quite heavy.  She also reports lateral lower extremities edema which is chronic, and left knee pain.  She has been quite sedentary due to the above issue, able to move around at home with cane.  She feels she has some balance issue, no recent fall at home.  She also reports mild cognitive dysfunction, no significant memory loss, she is seeing a neurologist.  She also follows up with her cardiologist and nephrologist.  She is tolerating exemestane well, she does not think her above symptoms are related to exemestane, no significant hot flash or other side effects.   MEDICAL HISTORY:  Past Medical History:  Diagnosis Date  . Anxiety   . Arthritis     osteoarthritis  . Breast cancer ([place])   . Breast cancer in female [facility])    Bilateral  . CAD S/P percutaneous coronary angioplasty 2007; March 2011   [facility] 3.0 mm 20 mm postdilated 3.6 mm in early mid LAD; status post ISR Cutting  Balloon PTCA and March 11 along with PCI of distal mid lesion with a 3.0 mm 12 mm MultiLink vision BMS; the proximal stent causes jailing of SP1 and SP2 with ostial 70-80% lesions  . Cancer (HCC)    Melanoma, Squamous cell Carcinoma  . CHF (congestive heart failure) ([place])   . Chronic kidney disease    stage 2   . Dementia ([place])    mild  . Diabetes mellitus type 2 with neurological manifestations ([place])   . Dyslipidemia, goal LDL below 70   . Full dentures   . Genetic testing 12/03/2016   Germline genetic testing was performed through [employer]'s Common Hereditary Cancers Panel + [facility]'s Melanoma Panel. This custom panel includes analysis of the following 51 genes: APC, ATM, AXIN2, BAP1, BARD1,  BMPR1A, BRCA1, BRCA2, BRIP1, CDH1, CDK4, CDKN2A, CHEK2, CTNNA1, DICER1, EPCAM, GREM1, HOXB13, KIT, MEN1, MITF, MLH1, MSH2, MSH3, MSH6, MUTYH, NBN, NF1, NTHL1, PALB2, PDGFRA, PMS2, POLD1, POL  . GERD (gastroesophageal reflux disease)   . Headache(784.0)   . History of hematuria    Followed by Dr. Gaynelle Arabian  . Hypertension, essential, benign   . Nausea & vomiting 10/2015  . Peripheral neuropathy   . Stroke ([place])   . TIA (transient ischemic attack)    multiple in the past  . Wears glasses     SURGICAL HISTORY: Past Surgical History:  Procedure Laterality Date  . ABDOMINAL HYSTERECTOMY    . APPENDECTOMY    . BLADDER SUSPENSION    . BREAST LUMPECTOMY Bilateral 2018  . BREAST LUMPECTOMY WITH RADIOACTIVE SEED AND SENTINEL LYMPH NODE BIOPSY Bilateral 12/14/2016   Procedure: BILATERAL BREAST LUMPECTOMY WITH BILATERAL  RADIOACTIVE SEED AND BILATERAL AXILLARY  SENTINEL LYMPH NODE BIOPSY ERAS PATHWAY;  Surgeon: Kristen Overall, MD;  Location: [facility];  Service: General;  Laterality: Bilateral;  PECTORAL BLOCK  . CARDIAC CATHETERIZATION  02/24/2006   85% stenosis in the proximal portion of LAD-arrangements made for PCI on 02/25/2006  . CARDIAC CATHETERIZATION  02/25/2006   80% LAD lesion stented with a 3x90m [facility] stent resulting in reduction of 80% lesion to 0% residual  . CARDIAC CATHETERIZATION  04/26/2006   Medical management  . CARDIAC CATHETERIZATION  06/24/2009   60-70% re-stenosis in the proximal LAD. A 3.25x15 cutting balloon, 3 inflations - 14atm-38sec, 13atm-39sec, and 12atm-40sec reduced to less than 10%. 60% stenosis of the mid/distal LAD stented with a [id number] stent.  . Marland KitchenARDIAC CATHETERIZATION  07/09/2009   Medical management  . CAROTID DOPPLER  06/24/2009   40-59% R ICA stenosis. No significant ICA stenosis noted  . CHOLECYSTECTOMY    . DILATION AND CURETTAGE OF UTERUS    . JOINT REPLACEMENT Left   . LESION REMOVAL Right 03/11/2015   Procedure:  EXCISION OF RIGHT PRE  TIBIAL LESION;  Surgeon: JaJudeth HornMD;  [facility] Service: General;  Laterality: Right;  . MASS EXCISION Left 06/10/2014   Procedure: EXCISION LEFT LOWER LEG LESION;  Surgeon: JaDoreen SalvageMD;  [facility] Service: General;  Laterality: Left;  . Marland KitchenASS EXCISION Left 09/05/2015   Procedure: EXCISION OF LEFT FOREARM  MASS;  Surgeon: JaJudeth HornMD;  [facility] Service: General;  Laterality: Left;  . NM MYOVIEW LTD  11/16/2011   5 beats a PVC during recovery.  No skin or infarction.  Reached 5 METs, EKG negative for ischemia   . NM MYOVIEW LTD  10/2011   No ischemia or infarction.  Normal EF.  LOW RISK. (Short bursts of 5 beats NSVT during recovery otherwise normal)  . SHOULDER ARTHROSCOPY  10/15  . SHOULDER ARTHROSCOPY  1995   left  . TEE WITHOUT CARDIOVERSION N/A 08/03/2017   Procedure: TRANSESOPHAGEAL ECHOCARDIOGRAM (TEE);  Surgeon: Skeet Latch, MD;  Location: [facility];  Service: Cardiovascular;; EF 60-65% with no wall motion normalities.  No atrial thrombus noted.  Lightly findings consistent with a lipomatous hypertrophy of the atrial septum.   . TENDON REPAIR Left 05/12/2016   Procedure: Left Anterior Tibial Tendon Reconstruction;  Surgeon: Newt Minion, MD;  Location: [facility];  Service: Orthopedics;  Laterality: Left;  . TOTAL KNEE ARTHROPLASTY  2005   left  . TRANSESOPHAGEAL ECHOCARDIOGRAM  08/03/2016   : EF 60-65% no wall motion normalities.  No atrial thrombus.  Lipomatous hypertrophy of the atrial septum.  No mass noted.  . TRANSTHORACIC ECHOCARDIOGRAM  10/2015   EF 55-60 %. Mild LVH. Mild pulmonary hypertension. Normal valves.  . TRANSTHORACIC ECHOCARDIOGRAM  07/2017   Vigorous LV function.  EF 65-70%.  Mild diastolic dysfunction with no RWMA. GR 1 DD.  Mild aortic sclerosis/no stenosis.  Questionable right atrial mass discounted with TEE)   . WRIST ARTHROPLASTY  2010   cancer lt wrist    SOCIAL  HISTORY: Social History   Socioeconomic History  . Marital status: Married    Spouse name: [name]  . Number of children: 3  . Years of education: 43  . Highest education level: Not on file  Occupational History  . Occupation: retired    Fish farm manager: RETIRED  Social Needs  . Financial resource strain: Not on file  . Food insecurity:    Worry: Not on file    Inability: Not on file  . Transportation needs:    Medical: Not on file    Non-medical: Not on file  Tobacco Use  . Smoking status: Never Smoker  . Smokeless tobacco: Never Used  Substance and Sexual Activity  . Alcohol use: No  . Drug use: No  . Sexual activity: Never  Lifestyle  . Physical activity:    Days per week: Not on file    Minutes per session: Not on file  . Stress: Not on file  Relationships  . Social connections:    Talks on phone: Not on file    Gets together: Not on file    Attends religious service: Not on file    Active member of club or organization: Not on file    Attends meetings of clubs or organizations: Not on file    Relationship status: Not on file  . Intimate partner violence:    Fear of current or ex partner: Not on file    Emotionally abused: Not on file    Physically abused: Not on file    Forced sexual activity: Not on file  Other Topics Concern  . Not on file  Social History Narrative   She is a married mother of 79, grandmother of 2.  Usually very active and out about the house.  But currently over last 6-8 weeks has been incapacitated.  She never smoked and does not drink alcohol.   10 th grade    FAMILY HISTORY: Family History  Problem Relation Age of Onset  . Diabetes Mother   . Hypertension Mother   . Kidney disease Mother   . Hypertension Father   . Emphysema Father   . Heart attack Father   . Heart disease Father   .  Arthritis Sister   . Diabetes Sister   . Hypertension Sister   . Breast cancer Sister 5       treated with mastectomy  . Cervical cancer Sister 72  .  Hypertension Brother   . Hyperlipidemia Brother   . Diabetes Brother   . Stroke Brother   . Diabetes Sister   . Hypertension Sister   . Stomach cancer Sister 36  . Hypertension Brother   . ALS Brother        d.62s  . Breast cancer Daughter 58       triple negative treated with lumpectomy, chemo, radiation  . Heart attack Daughter 34       d.45  . COPD Daughter   . Cancer Paternal Aunt        unspecified type  . Cancer Paternal Uncle        unspecified type  . Cancer Paternal Uncle        unspecified type    ALLERGIES:  is allergic to lipitor [atorvastatin]; lyrica [pregabalin]; nsaids; reglan [metoclopramide]; and ultram [tramadol].  MEDICATIONS:  Current Outpatient Medications  Medication Sig Dispense Refill  . allopurinol (ZYLOPRIM) 100 MG tablet Take 1 tablet (100 mg total) by mouth 2 (two) times daily. 30 tablet 0  . allopurinol (ZYLOPRIM) 100 MG tablet Take 1 tablet (100 mg total) by mouth 2 (two) times daily. 60 tablet 3  . bumetanide (BUMEX) 2 MG tablet Take 2 mg by mouth 2 (two) times daily. Patient currently taking 2 tablets in am and 1 in pm    . Calcium Carb-Cholecalciferol (CALCIUM 600 + D PO) Take 1 tablet by mouth at bedtime.     . Cholecalciferol 2000 units CAPS Take 2,000 Units by mouth daily.    . clopidogrel (PLAVIX) 75 MG tablet TAKE 1 TABLET DAILY 90 tablet 4  . COD LIVER OIL PO Take 415 mg by mouth daily.    . colchicine 0.6 MG tablet Take 1 tablet (0.6 mg total) by mouth daily. PRN gout flare (Patient taking differently: Take 0.6 mg by mouth daily. PRN gout flare for 3 days) 90 tablet 3  . CRANBERRY PO Take 1 capsule by mouth daily.    . diazepam (VALIUM) 5 MG tablet Take 5 mg by mouth daily as needed for anxiety.    . diphenhydrAMINE (BENADRYL) 50 MG capsule Take 50 mg by mouth at bedtime as needed for sleep.     Marland Kitchen exemestane (AROMASIN) 25 MG tablet TAKE 1 TABLET DAILY AFTER BREAKFAST 90 tablet 4  . Fish Oil-Krill Oil (KRILL OIL PLUS PO) Take 1 capsule  by mouth daily.     Marland Kitchen gabapentin (NEURONTIN) 300 MG capsule Take 1 capsule (300 mg total) by mouth 2 (two) times daily. Take 1 during the day if needed and 2 at night (Patient taking differently: Take 300 mg by mouth 3 (three) times daily. Taking one 3 times a day) 270 capsule 1  . insulin NPH Human (HUMULIN N,NOVOLIN N) 100 UNIT/ML injection Inject 20-25 Units into the skin 2 (two) times daily before a meal. 20 units am and 25 units PM    . Iron-FA-B Cmp-C-Biot-Probiotic (FUSION PLUS) CAPS takes 1 tablet daily  9  . Melatonin 5 MG CAPS Take 5 mg by mouth at bedtime as needed (sleep).     . Methylfol-Methylcob-Acetylcyst (CEREFOLIN NAC) 6-2-600 MG TABS TAKE 1 CAPLET BY MOUTH ONCE DAILY. 90 each 3  . metolazone (ZAROXOLYN) 2.5 MG tablet Take 30 min before the morning  dose of bumex as directed daily. (Patient taking differently: Take 2.5 mg by mouth. Take 30 min before the morning dose of bumex as directed daily. Monday and Thursday) 90 tablet 3  . metoprolol succinate (TOPROL XL) 50 MG 24 hr tablet TAKE ONE-HALF (1/2) TABLET DAILY 45 tablet 3  . mirtazapine (REMERON) 15 MG tablet Take 15 mg by mouth at bedtime. 11/2 tablet at bedtime    . oxyCODONE-acetaminophen (PERCOCET/ROXICET) 5-325 MG tablet Take 1 tablet by mouth every 4 (four) hours as needed. 40 tablet 0  . pantoprazole (PROTONIX) 40 MG tablet TAKE 1 TABLET DAILY (CONTACT OFFICE FOR ADDITIONAL REFILLS, FINAL ATTEMPT) 90 tablet 0  . potassium chloride SA (K-DUR,KLOR-CON) 20 MEQ tablet Take 40 mEq by mouth 2 (two) times daily.     . pramipexole (MIRAPEX) 0.25 MG tablet TAKE 1 TABLET AT 4:00 P.M. AND 2 TABLETS (0.5 MG) AT BEDTIME 270 tablet 2  . Resveratrol (RESERVAPAK PO) Take 1 tablet by mouth every evening.     . rosuvastatin (CRESTOR) 20 MG tablet Take 20 mg by mouth at bedtime.    Marland Kitchen Ubiquinol (QUNOL COQ10/UBIQUINOL/MEGA) 100 MG CAPS Take 1 tablet by mouth daily. CoQ10, Qunol     No current facility-administered medications for this visit.       REVIEW OF SYSTEMS:   Constitutional: Denies fevers, chills or abnormal night sweats (+) fatigue, weakness Eyes: Denies blurriness of vision, double vision or watery eyes Ears, nose, mouth, throat, and face: Denies mucositis or sore throat Respiratory: Denies cough, dyspnea or wheezes Cardiovascular: Denies palpitation, chest discomfort (+) significant lower extremity swelling to lower leg to feet B/l Gastrointestinal:  Denies nausea, heartburn or change in bowel habits Skin: Denies abnormal skin rashes Breast: (+) soreness in right breast form surgery   Lymphatics: Denies new lymphadenopathy or easy bruising MSK: (+) Arthritis  Neurological:Denies numbness, tingling or new weaknesses  Behavioral/Psych: Mood is stable, no new changes  All other systems were reviewed with the patient and are negative.  PHYSICAL EXAMINATION:  ECOG PERFORMANCE STATUS: 3  Vitals:   02/13/18 1141 02/13/18 1216  BP: 132/75   Pulse: 95   Resp: 18   Temp: 97.7 F (36.5 C)   SpO2: (!) 83% 100%   Filed Weights   02/13/18 1141  Weight: 189 lb 4.8 oz (85.9 kg)    GENERAL:alert, no distress and comfortable SKIN: skin color, texture, turgor are normal, no rashes or significant lesions EYES: normal, conjunctiva are pink and non-injected, sclera clear OROPHARYNX:no exudate, no erythema and lips, buccal mucosa, and tongue normal  NECK: supple, thyroid normal size, non-tender, without nodularity LYMPH:  no palpable lymphadenopathy in the cervical, axillary or inguinal LUNGS: clear to auscultation and percussion with normal breathing effort HEART: regular rate & rhythm and no murmurs, (+) b/l pitting edema is on her feet and lower extremity ABDOMEN:abdomen soft, non-tender and normal bowel sounds Musculoskeletal:no cyanosis of digits and no clubbing  PSYCH: alert & oriented x 3 with fluent speech NEURO: no focal motor/sensory deficits Breasts: Breast inspection showed them to be symmetrical with no  nipple discharge. S/p Bilateral breast lumpectomy: (+) Palpation of the breasts and axilla revealed no obvious mass that I could appreciate. Surgical scars healed well.   LABORATORY DATA:  I have reviewed the data as listed CBC Latest Ref Rng & Units 02/13/2018 08/15/2017 08/01/2017  WBC 4.0 - 10.5 K/uL 7.4 6.9 8.5  Hemoglobin 12.0 - 15.0 g/dL 12.6 11.9 12.1  Hematocrit 36.0 - 46.0 % 39.2 35.8 37.6  Platelets 150 - 400 K/uL 195 211 274   CMP Latest Ref Rng & Units 02/13/2018 08/15/2017 08/03/2017  Glucose 70 - 99 mg/dL 157(H) 92 119(H)  BUN 8 - 23 mg/dL 47(H) 57(H) 27(H)  Creatinine 0.44 - 1.00 mg/dL 2.45(H) 2.22(H) 1.75(H)  Sodium 135 - 145 mmol/L 144 142 140  Potassium 3.5 - 5.1 mmol/L 3.6 4.0 4.0  Chloride 98 - 111 mmol/L 100 105 101  CO2 22 - 32 mmol/L 32 26 28  Calcium 8.9 - 10.3 mg/dL 10.2 10.0 9.9  Total Protein 6.5 - 8.1 g/dL 7.4 7.1 -  Total Bilirubin 0.3 - 1.2 mg/dL 0.4 0.3 -  Alkaline Phos 38 - 126 U/L 104 92 -  AST 15 - 41 U/L 22 22 -  ALT 0 - 44 U/L 20 19 -    PATHOLOGY  Diagnosis 12/14/16 1. Breast, lumpectomy, Left w/seed - INVASIVE DUCTAL CARCINOMA, 1 CM. - ATYPICAL LOBULAR HYPERPLASIA ([place]). - PREVIOUS BIOPSY SITE. - INVASIVE CARCINOMA 0.2 CM FROM LATERAL MARGIN. - ATYPICA LOBULAR HYPERPLASIA LESS THAN 0.1 CM FROM LATERAL MARGIN. 2. Breast, lumpectomy, Right w/seed - INVASIVE DUCTAL CARCINOMA, 1.1 CM. - FIBROCYSTIC CHANGES. - PREVIOUS BIOPSY SITE. - INVASIVE CARCINOMA 1.1 CM FROM POSTERIOR MARGIN. 3. Lymph node, sentinel, biopsy, Right Axillary Contents - BENIGN FIBROADIPOSE TISSUE. - NO LYMPH NODE TISSUE OR MALIGNANCY. 4. Lymph node, sentinel, biopsy, Left Axillary - ONE BENIGN LYMPH NODE (0/1). Microscopic Comment 1. BREAST, INVASIVE TUMOR Procedure: Localized lumpectomy and one sentinel lymph node. Laterality: Left breast. Tumor Size: 1 cm. Histologic Type: Ductal. Grade: I. Tubular Differentiation: 1. Nuclear Pleomorphism: 2. Mitotic Count:  1. Ductal Carcinoma in Situ (DCIS): N/A. Extent of Tumor: Skin: N/A. Nipple: N/A. Skeletal muscle: N/A. 1 of 4 FINAL for Severs, Avalene G ([id number]) Microscopic Comment(continued) Margins: Free of tumor. Invasive carcinoma, distance from closest margin: 0.3 cm from lateral margin. DCIS, distance from closest margin: N/A. Regional Lymph Nodes: Number of Lymph Nodes Examined: 1. Number of Sentinel Lymph Nodes Examined: 1. Lymph Nodes with Macrometastases: 0. Lymph Nodes with Micrometastases: 0. Lymph Nodes with Isolated Tumor Cells: 0. Breast Prognostic Profile: Case [id number]. Estrogen Receptor: 100%, positive, strong staining. Progesterone Receptor: 10%, positive, weak staining. Her2: Negative, ratio 1.54. Ki-67: 2%. Best tumor block for sendout testing: 1E. Pathologic Stage Classification (pTNM, AJCC 8th Edition): Primary Tumor (pT): pT1b. Regional Lymph Nodes (pN): pN0. Distant Metastases (pM): pMX. 2. BREAST, INVASIVE TUMOR Procedure: Localized lumpectomy. Laterality: Right breast. Tumor Size: 1.1 cm. Histologic Type: Ductal. Grade: I. Tubular Differentiation: 1. Nuclear Pleomorphism: 2. Mitotic Count: 1. Ductal Carcinoma in Situ (DCIS): N/A. Extent of Tumor: Skin: N/A. Nipple: N/A. Skeletal muscle: N/A. Margins: Free of tumor. Invasive carcinoma, distance from closest margin: 1.1 cm from posterior margin. DCIS, distance from closest margin: N/A. Regional Lymph Nodes: Number of Lymph Nodes Examined: 0. Number of Sentinel Lymph Nodes Examined: 0. Lymph Nodes with Macrometastases: N/A. Lymph Nodes with Micrometastases: N/A. Lymph Nodes with Isolated Tumor Cells: N/A. Breast Prognostic Profile: Case [id number]. Estrogen Receptor: 100%, positive, strong staining. Progesterone Receptor: 100%, positive, strong staining. Her2: Negative, ratio 1.28. Ki-67: 5%. Best tumor block for sendout testing: 64F. 2 of 4 FINAL for Deane, Miasha G ([id number]) Microscopic  Comment(continued) Pathologic Stage Classification (pTNM, AJCC 8th Edition): Primary Tumor (pT): pT1c. Regional Lymph Nodes (pN): pNX. Distant Metastases (pM): pMX.    Diagnosis 11/11/2016 Breast, left, needle core biopsy, upper outer - INVASIVE MAMMARY CARCINOMA. - ATYPICAL LOBULAR HYPERPLASIA. - FIBROCYSTIC CHANGES AND PSEUDOANGIOMATOUS STROMAL HYPERPLASIA ([place]).  Diagnosis  10/10/2016 Breast, right, needle core biopsy, 11:30 o'clock - INVASIVE DUCTAL CARCINOMA, SEE COMMENT.  Estrogen Receptor: 100%, POSITIVE, STRONG STAINING INTENSITY Progesterone Receptor: 100%, POSITIVE, STRONG STAINING INTENSITY Proliferation Marker Ki67: 5% HER2 - NEGATIVE  Diagnosis 09/05/15 Skin , Left forearm - INVASIVE WELL DIFFERENTIATED SQUAMOUS CELL CARCINOMA, MEASURING 0.6 CM IN GREATEST DIMENSION. - SOLAR ELASTOSIS IS PRESENT. - MARGINS ARE NEGATIVE. - SEE ONCOLOGY TEMPLATE.    Oncotype 12/14/16    RADIOGRAPHIC STUDIES: I have personally reviewed the radiological images as listed and agreed with the findings in the report. No results found.   Bone Density Scan: 02/24/17 ASSESSMENT: The BMD measured at Femur Neck Left is 0.810 g/cm2 with a T-score of -1.6.  Diagnostic mammogram on 10/20/16  showed persistent distortion corresponds to a vaguely palpable irregular mass and is suspicious for malignancy.Targeted ultrasound is performed, showing an irregular hypoechoic mass with posterior acoustic shadowing in the 11:30 o'clock location of the right breast 4 cm from the nipple which measures 0.9 x 0.9 x0.8 cm. There is associated internal vascularity. Evaluation of the axilla is negative for adenopathy.   Bone Density Scan 10/28/14 ASSESSMENT: BMD as determined from Femur Neck Right is 0.818 g/cm2 with a T-Score of -1.6.   ASSESSMENT & PLAN: 77 y.o. postmenopausal woman, with past medical history of coronary artery disease, hypertension, diabetes, CKD, presented with screening discovered  bilateral breast cancer.  1.  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  -She underwent Bilateral Lumpectomy and bilateral SLNB by Dr. Lucia [name] on 12/14/16.  I reviewed her surgical pathology results with her in details, surgical margins were negative.  - Her Oncotype results returned low risk, recurrence score 16, which predicts 10-year of distant recurrence 10% with tamoxifen, she will not need chemotherapy.  -She completed radiation with Dr. Lisbeth Renshaw on 01/17/17 - 02/11/17, tolerated well.  -Started Exemestane in 02/2017. She is doing well with no side effects -She does have multiple medical comorbidities, and is not very mobile due to the leg edema, right foot pain, etc., her symptoms are not particularly concerning for cancer recurrence. -Lab reviewed, exam was unremarkable, no clinical concern for recurrence. -Continue exemestane, will f/u in 6 months    2. Genetics -Due to her family history of breast cancer, she was referred to genetics. she underwent genetic testing   -She is positive for variant of uncertain significance identified in [place]. Negative for other gene mutations.   3. HTN, DM, CAD and CHF with LE edema  -She'll continue medication and follow-up with her primary care physician -DM controlled  -continue f/u with nephrologist [name], and her cardiologist [name]  4. CKD, Stage IV -She will continue to follow up with [name] -CR at 2.45 today, [name] stable   5. Arthritis, balance issue -worsening right foot pain,. Probably related to her DM -she wears a special boots, f/u with her podiatrist -I recommend her to have physical therapy to improve her balance and mobility, refer her home physical therapy.  6. Osteopenia    -2016 bone density, right femur shows osteopenia  -DEXA from 02/24/17 results with a T score of -1.6 -continue calcium and vit D   PLAN -Continue Exemestane  -Lab and f/u in 6 months   -I will refer her to home physical therapy for balance and  mobility issues -She will continue follow-up with other specialist.   Orders Placed This Encounter  Procedures  . Ambulatory referral to Physical Therapy    Referral Priority:   Routine    Referral Type:   Physical Medicine    Referral Reason:   Specialty Services Required    Requested Specialty:   Physical Therapy    Number of Visits Requested:   1    All questions were answered. The patient knows to call the clinic with any problems, questions or concerns. I spent 20 minutes counseling the patient face to face. The total time spent in the appointment was 25 minutes and more than 50% was on counseling.   Kristen Merle, MD 02/13/2018

## 2018-02-14 ENCOUNTER — Other Ambulatory Visit: Payer: Self-pay | Admitting: *Deleted

## 2018-02-14 NOTE — Patient Outreach (Signed)
[facility]) Care Management  02/14/2018  [name] [birth year] [id number]   Telephone assessment   Incoming call from the patient she discussed receiving education material, reports that she does better with reviewing information ,she discussed her mild cognitive deficit diagnosis. Reassured patient that I will be able to review education with her.    Patient also discussed her visit to Dr.Feng  For follow up  on history of breast cancer. on yesterday she discussed her recent problems with falls and balance and he has ordered home health therapy.  Patient was asking if [facility] had therapist, discussed with patient that I noted Dr.Feng has office has placed order for referral to home health and to anticipate phone call from an agency.   Diabetes  Patient discussed her concern regarding blood sugars ranging 150 to 200's and recent A1c being 7.0 and she is used to her A1c being in th 5 range. Patient also discussed concern regarding how this is affecting  her chronic kidney disease .Discussed with patient notifying Dr.Balen regarding her concern and arrange for a sooner. Patient discussed watching her diet, taking her medications and trying to control her diabetes.   Right Foot joint problem /Fall Risk  Patient discussed follow up appointment with Dr.Duda on this week, encourage to discuss her concern regarding right foot wearing brace , problems with balance, and plans regarding problems with right foot. Marland Kitchen  Appointments  Patient has appointment with Dr.Duda 11/21 Patient has appointment with [name] , for chronic kidney disease in the next week.    Plan  Will plan follow up call with patient in the next week, regarding start of home health physical therapy.  Review of EMMI on preventing falls. Continue education and support of chronic conditions    THN CM Care Plan Problem One     Most Recent Value  Care Plan Problem One  Knowledge deficient  related to self care  management of Heart failure   Role Documenting the Problem One  Care Management [place] for Problem One  Active  [facility] Long Term Goal   Patient will verbalize plan of action plan for yellow zone in the next 31 days  [reestablished ]  THN Long Term Goal Start Date  02/07/18  Interventions for Problem One Long Term Goal  Discussed current clinical symptoms and reinforced notifying MD of worsening symptoms and notifying MD of sudden weight gain of 3 pounds in a day ,5 in a week and increased swelling or shortness of breath .   [facility]N CM Short Term Goal #1   Patient will continue to weigh daily and keep a record over the next 28 days   THN CM Short Term Goal #1 Start Date  02/07/18  Interventions for Short Term Goal #1  Positive reinforcement of daily weighing   THN CM Short Term Goal #2   Over the next 30 days patient will be able to verbalize 2 symptoms of CHF worsening symptoms   THN CM Short Term Goal #2 Start Date  02/07/18  Interventions for Short Term Goal #2  Discussed symptoms of worsening heart failure symptoms     THN CM Care Plan Problem Two     Most Recent Value  Care Plan Problem Two  Knowledge related to Diabetes, new to insulin   Role Documenting the Problem Two  Care Management [place] for Problem Two  Active  Interventions for Problem Two Long Term Goal   Advised patient  notifying MD of consitent elevated blood sugars of 200, encouraged to arrange sooner appointment with Dr.Balen to discuss blood sugar concerns   THN Long Term Goal  Patient will be able to report increase in knowledge of Diabetes self care managment over the next 90 days   THN Long Term Goal Start Date  11/15/17  [facility] CM Short Term Goal #1   Patient will report increased knowledge of snack food options over the next 30 days   THN CM Short Term Goal #1 Start Date  11/15/17  [facility] CM Short Term Goal #1 Met Date   01/16/18  [facility] CM Short Term Goal #2   Patient will be able to report continuing  to  monitoring blood sugars at least 2 times a day over the next 30 days  THN CM Short Term Goal #2 Start Date  02/14/18  Interventions for Short Term Goal #2  Discussed with patient recent blood sugar reading and continuing to keep a record of reading to take to specialist appointment     [facility] CM Care Plan Problem Three     Most Recent Value  Care Plan Problem Three  High fall risk related to recent report of falls   Role Documenting the Problem Three  Care Management [place] for Problem Three  Active  THN CM Short Term Goal #1   Patient will verbalize not falls in the next 30 days   THN CM Short Term Goal #1 Start Date  02/07/18  Interventions for Short Term Goal #1  Reviewed current clinical state, reinforced keeping upcoming MD appointment with orthopedist and wriitng down questions concern to discuss at visit.    THN CM Short Term Goal #2   Patient will be able to reports 2 actions to take after having a fall over the next 28 days   THN CM Short Term Goal #2 Start Date  02/07/18  Interventions for Short Term Goal #2  Review EMMI on preventing falls, continuing to use her cane.   THN CM Short Term Goal #3   Patient will be able to report being active with home heath physical therapy in the next 14 days   THN  CM Short Term Goal #3 Start Date  02/14/18  Interventions for Short Term Goal #3  Will plan follow up call patient in the next 5 days to follow up on home health , discussed with patient it usually takes about 72 hours from time of referral.       [name], RN, [place] Management Coordinator  [phone]- Mobile [phone]- [place]

## 2018-02-16 ENCOUNTER — Other Ambulatory Visit: Payer: Self-pay | Admitting: *Deleted

## 2018-02-16 ENCOUNTER — Ambulatory Visit (INDEPENDENT_AMBULATORY_CARE_PROVIDER_SITE_OTHER): Payer: Medicare Other | Admitting: Orthopedic Surgery

## 2018-02-16 ENCOUNTER — Encounter (INDEPENDENT_AMBULATORY_CARE_PROVIDER_SITE_OTHER): Payer: Self-pay | Admitting: Orthopedic Surgery

## 2018-02-16 ENCOUNTER — Telehealth: Payer: Self-pay | Admitting: Hematology

## 2018-02-16 VITALS — Ht 66.0 in | Wt 189.3 lb

## 2018-02-16 DIAGNOSIS — M76821 Posterior tibial tendinitis, right leg: Secondary | ICD-10-CM | POA: Diagnosis not present

## 2018-02-16 DIAGNOSIS — M25571 Pain in right ankle and joints of right foot: Secondary | ICD-10-CM

## 2018-02-16 DIAGNOSIS — I2510 Atherosclerotic heart disease of native coronary artery without angina pectoris: Secondary | ICD-10-CM

## 2018-02-16 DIAGNOSIS — Z9861 Coronary angioplasty status: Secondary | ICD-10-CM | POA: Diagnosis not present

## 2018-02-16 NOTE — Progress Notes (Signed)
Office Visit Note   Patient: [name]           Date of Birth: Oct 10, 1940           MRN: [id number] Visit Date: 02/16/2018              Requested by: [address], [address], [place] [address] PCP: Redmond School, MD  Chief Complaint  Patient presents with  . Right Foot - Follow-up  . Left Foot - Follow-up      HPI: Patient is a 77 year old woman with gout and posterior tibial tendon on the right.  Patient is undergone prolonged conservative care with fracture boots for the right lower extremity and still has impingement pain in the sinus Tarsi and still has pain over the posterior tibial tendon.  Patient now has to walk with a cane she states she is fallen twice.  For her gout she is currently on allopurinol 100 mg twice a day and patient states she is taking colchicine about 1-2 times a week.  Patient complains of hip pain with wearing the fracture boot.  She states that her hemoglobin A1c is now above 7.  She states her doctor recommended she eat potato a day.  Most recent uric acid was 8.7 uric acid repeated today.  Assessment & Plan: Visit Diagnoses:  1. Pain in right ankle and joints of right foot   2. Posterior tibial tendinitis, right leg     Plan: Discussed with the patient continued conservative therapy versus surgical intervention.  Discussed the risks of infection wound not healing bone nonhealing persistent pain need for additional surgery.  Patient states she understands she states she cannot go on living like this and wishes to proceed with surgery.  Will plan for a talonavicular and subtalar fusion.  She will be nonweightbearing for 1 month weightbearing as tolerated in a fracture boot for a month.  Follow-Up Instructions: Return in about 2 weeks (around 03/02/2018).   Ortho Exam  Patient is alert, oriented, no adenopathy, well-dressed, normal affect, normal respiratory effort. Examination patient has good pulses she has a pronated valgus  hindfoot which is getting worse.  She has skin breakdown from the posterior tibial tendon insufficiency medially.  She has tenderness to palpation in the sinus Tarsi she has tenderness to palpation along the posterior tibial tendon she cannot do a single limb heel raise she has a positive too many toes sign.  She has a good dorsalis pedis pulse.  She does have venous stasis swelling.  Imaging: No results found. No images are attached to the encounter.  Labs: Lab Results  Component Value Date   HGBA1C 5.7 (H) 12/07/2016   HGBA1C 5.8 (H) 05/06/2016   HGBA1C 6.0 (H) 11/17/2015   LABURIC 9.1 (H) 01/19/2018   LABURIC 8.7 (H) 12/22/2017   LABURIC 14.4 (H) 11/24/2017   REPTSTATUS 11/18/2015 FINAL 11/17/2015   CULT (A) 11/17/2015    50,000 COLONIES/mL LACTOBACILLUS SPECIES Standardized susceptibility testing for this organism is not available.    LABORGA ESCHERICHIA COLI 12/31/2009     Lab Results  Component Value Date   ALBUMIN 3.8 02/13/2018   ALBUMIN 3.8 08/15/2017   ALBUMIN 4.7 08/01/2017   LABURIC 9.1 (H) 01/19/2018   LABURIC 8.7 (H) 12/22/2017   LABURIC 14.4 (H) 11/24/2017    Body mass index is 30.55 kg/m.  Orders:  Orders Placed This Encounter  Procedures  . Uric acid   No orders of the defined types were placed in  this encounter.    Procedures: No procedures performed  Clinical Data: No additional findings.  ROS:  All other systems negative, except as noted in the HPI. Review of Systems  Objective: Vital Signs: Ht '5\' 6"'$  (1.676 m)   Wt 189 lb 4.8 oz (85.9 kg)   BMI 30.55 kg/m   Specialty Comments:  No specialty comments available.  PMFS History: Patient Active Problem List   Diagnosis Date Noted  . Right atrial mass   . Chronic diastolic heart failure ([place]) 08/01/2017  . Genetic testing 12/03/2016  . 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 Malignant neoplasm of upper-outer quadrant of left breast in female, estrogen receptor positive ([place])  11/19/2016  . Chronic idiopathic gout involving toe of left foot without tophus 11/15/2016  . Malignant 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, estrogen receptor positive ([place]) 11/09/2016  . Tendon tear, ankle, left, sequela   . Traumatic rupture of left anterior tibial tendon 04/20/2016  . Acute on chronic kidney failure ([place])   . 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 ([place])   . Headache, migraine   . Unexplained weight loss 11/17/2015  . CKD (chronic kidney disease) stage 3, GFR 30-59 ml/min (HCC) 11/17/2015  . Restless legs syndrome 07/24/2015  . Mild cognitive impairment 01/08/2015  . Abnormality of gait 01/08/2015  . Dizziness 12/19/2014  . Preoperative cardiovascular examination 12/11/2013  . GERD (gastroesophageal reflux disease) 10/06/2013  . Syncope 10/05/2013  . Dementia ([place]) 12/06/2012  . Anxiety 10/28/2012    Class: Acute  . Bilateral lower extremity edema 10/28/2012  . CAD S/P percutaneous coronary angioplasty   . Essential hypertension   . Dyslipidemia, goal LDL below 70    Past Medical History:  Diagnosis Date  . Anxiety   . Arthritis     osteoarthritis  . Breast cancer ([place])   . Breast cancer in female [facility])    Bilateral  . CAD S/P percutaneous coronary angioplasty 2007; March 2011   [employer] EMS 3.0 mm 20 mm postdilated 3.6 mm in early mid LAD; status post ISR Cutting Balloon PTCA and March 11 along with PCI of distal mid lesion with a 3.0 mm 12 mm MultiLink vision BMS; the proximal stent causes jailing of SP1 and SP2 with ostial 70-80% lesions  . Cancer (HCC)    Melanoma, Squamous cell Carcinoma  . CHF (congestive heart failure) ([place])   . Chronic kidney disease    stage 2   . Dementia ([place])    mild  . Diabetes mellitus type 2 with neurological manifestations ([place])   . Dyslipidemia, goal LDL below 70   . Full dentures   . Genetic testing 12/03/2016   Germline genetic testing was performed through [employer] Common  Hereditary Cancers Panel + [employer]'s Melanoma Panel. This custom panel includes analysis of the following 51 genes: APC, ATM, AXIN2, BAP1, BARD1, BMPR1A, BRCA1, BRCA2, BRIP1, CDH1, CDK4, CDKN2A, CHEK2, CTNNA1, DICER1, EPCAM, GREM1, HOXB13, KIT, MEN1, MITF, MLH1, MSH2, MSH3, MSH6, MUTYH, NBN, NF1, NTHL1, PALB2, PDGFRA, PMS2, POLD1, POL  . GERD (gastroesophageal reflux disease)   . Headache(784.0)   . History of hematuria    Followed by Dr. Gaynelle Arabian  . Hypertension, essential, benign   . Nausea & vomiting 10/2015  . Peripheral neuropathy   . Stroke ([place])   . TIA (transient ischemic attack)    multiple in the past  . Wears glasses     Family History  Problem Relation Age of Onset  . Diabetes Mother   .  Hypertension Mother   . Kidney disease Mother   . Hypertension Father   . Emphysema Father   . Heart attack Father   . Heart disease Father   . Arthritis Sister   . Diabetes Sister   . Hypertension Sister   . Breast cancer Sister 63       treated with mastectomy  . Cervical cancer Sister 40  . Hypertension Brother   . Hyperlipidemia Brother   . Diabetes Brother   . Stroke Brother   . Diabetes Sister   . Hypertension Sister   . Stomach cancer Sister 93  . Hypertension Brother   . ALS Brother        d.62s  . Breast cancer Daughter 55       triple negative treated with lumpectomy, chemo, radiation  . Heart attack Daughter 55       d.45  . COPD Daughter   . Cancer Paternal Aunt        unspecified type  . Cancer Paternal Uncle        unspecified type  . Cancer Paternal Uncle        unspecified type    Past Surgical History:  Procedure Laterality Date  . ABDOMINAL HYSTERECTOMY    . APPENDECTOMY    . BLADDER SUSPENSION    . BREAST LUMPECTOMY Bilateral 2018  . BREAST LUMPECTOMY WITH RADIOACTIVE SEED AND SENTINEL LYMPH NODE BIOPSY Bilateral 12/14/2016   Procedure: BILATERAL BREAST LUMPECTOMY WITH BILATERAL  RADIOACTIVE SEED AND BILATERAL AXILLARY  SENTINEL LYMPH NODE  BIOPSY ERAS PATHWAY;  Surgeon: Alphonsa Overall, MD;  Location: [facility];  Service: General;  Laterality: Bilateral;  PECTORAL BLOCK  . CARDIAC CATHETERIZATION  02/24/2006   85% stenosis in the proximal portion of LAD-arrangements made for PCI on 02/25/2006  . CARDIAC CATHETERIZATION  02/25/2006   80% LAD lesion stented with a 3x71m Liberte stent resulting in reduction of 80% lesion to 0% residual  . CARDIAC CATHETERIZATION  04/26/2006   Medical management  . CARDIAC CATHETERIZATION  06/24/2009   60-70% re-stenosis in the proximal LAD. A 3.25x15 cutting balloon, 3 inflations - 14atm-38sec, 13atm-39sec, and 12atm-40sec reduced to less than 10%. 60% stenosis of the mid/distal LAD stented with a 3x113mMultilink stent.  . Marland KitchenARDIAC CATHETERIZATION  07/09/2009   Medical management  . CAROTID DOPPLER  06/24/2009   40-59% R ICA stenosis. No significant ICA stenosis noted  . CHOLECYSTECTOMY    . DILATION AND CURETTAGE OF UTERUS    . JOINT REPLACEMENT Left   . LESION REMOVAL Right 03/11/2015   Procedure:  EXCISION OF RIGHT PRE TIBIAL LESION;  Surgeon: JaJudeth HornMD;  [place] Service: General;  Laterality: Right;  . MASS EXCISION Left 06/10/2014   Procedure: EXCISION LEFT LOWER LEG LESION;  Surgeon: JaDoreen SalvageMD;  [place] Service: General;  Laterality: Left;  . Marland KitchenASS EXCISION Left 09/05/2015   Procedure: EXCISION OF LEFT FOREARM  MASS;  Surgeon: JaJudeth HornMD;  [place] Service: General;  Laterality: Left;  . NM MYOVIEW LTD  11/16/2011   5 beats a PVC during recovery.  No skin or infarction.  Reached 5 METs, EKG negative for ischemia   . NM MYOVIEW LTD  10/2011   No ischemia or infarction.  Normal EF.  LOW RISK. (Short bursts of 5 beats NSVT during recovery otherwise normal)  . SHOULDER ARTHROSCOPY  10/15  . SHOULDER ARTHROSCOPY  1995  left  . TEE WITHOUT CARDIOVERSION N/A 08/03/2017   Procedure: TRANSESOPHAGEAL  ECHOCARDIOGRAM (TEE);  Surgeon: Skeet Latch, MD;  Location: [facility];  Service: Cardiovascular;; EF 60-65% with no wall motion normalities.  No atrial thrombus noted.  Lightly findings consistent with a lipomatous hypertrophy of the atrial septum.   . TENDON REPAIR Left 05/12/2016   Procedure: Left Anterior Tibial Tendon Reconstruction;  Surgeon: Newt Minion, MD;  Location: [place];  Service: Orthopedics;  Laterality: Left;  . TOTAL KNEE ARTHROPLASTY  2005   left  . TRANSESOPHAGEAL ECHOCARDIOGRAM  08/03/2016   : EF 60-65% no wall motion normalities.  No atrial thrombus.  Lipomatous hypertrophy of the atrial septum.  No mass noted.  . TRANSTHORACIC ECHOCARDIOGRAM  10/2015   EF 55-60 %. Mild LVH. Mild pulmonary hypertension. Normal valves.  . TRANSTHORACIC ECHOCARDIOGRAM  07/2017   Vigorous LV function.  EF 65-70%.  Mild diastolic dysfunction with no RWMA. GR 1 DD.  Mild aortic sclerosis/no stenosis.  Questionable right atrial mass discounted with TEE)   . WRIST ARTHROPLASTY  2010   cancer lt wrist   Social History   Occupational History  . Occupation: retired    Fish farm manager: RETIRED  Tobacco Use  . Smoking status: Never Smoker  . Smokeless tobacco: Never Used  Substance and Sexual Activity  . Alcohol use: No  . Drug use: No  . Sexual activity: Never

## 2018-02-16 NOTE — Telephone Encounter (Signed)
Mailed pt calendar  °

## 2018-02-16 NOTE — Patient Outreach (Signed)
[facility]) Care Management  02/16/2018  [name] 02/01/1941 [id number]  Telephone assessment    Patient with hospital admission 5/6-5/8, Dx Acute on Chronic heart failure PMH: includes but not limited to CKD, Diabetes, HTN, Heart Failure, breast cancer, gout elevated uric acid level  Right ankle pain, chronic idiopathic gout,    Incoming call from patient , she called to update me after her visit appointment with Dr.Duda. Patient reports that she is having surgery to right foot on 11/26 an plan for overnight stay . Patient states that it is not working with her wearing the boot, continued foot pain and being off balance, she wants to be able to get back to being active as tolerated. Patient states family has arranged a wheelchair for her and a walker.   Patient has office visit with Dr.Patel on Monday and plans to discuss her medication option for diabetes, she discussed blood sugar  running better while on metformin,  voiced understanding why medication was discontinued because of her kidney function.  She plans to follow up with Dr.Balen also regarding blood sugar running in the 200 to 300's.   Plan  Will follow up with patient after surgery by telephone , will monitor disposition.   [name], RN, [place] Management Coordinator  [phone]- Mobile [phone]- Toll Free Main Office

## 2018-02-17 LAB — URIC ACID: Uric Acid, Serum: 10.2 mg/dL — ABNORMAL HIGH (ref 2.5–7.0)

## 2018-02-20 ENCOUNTER — Ambulatory Visit (INDEPENDENT_AMBULATORY_CARE_PROVIDER_SITE_OTHER): Payer: Self-pay | Admitting: Physician Assistant

## 2018-02-20 ENCOUNTER — Ambulatory Visit: Payer: Self-pay | Admitting: *Deleted

## 2018-02-20 ENCOUNTER — Encounter (HOSPITAL_COMMUNITY): Payer: Self-pay

## 2018-02-20 DIAGNOSIS — N2581 Secondary hyperparathyroidism of renal origin: Secondary | ICD-10-CM | POA: Diagnosis not present

## 2018-02-20 DIAGNOSIS — N184 Chronic kidney disease, stage 4 (severe): Secondary | ICD-10-CM | POA: Diagnosis not present

## 2018-02-20 DIAGNOSIS — D631 Anemia in chronic kidney disease: Secondary | ICD-10-CM | POA: Diagnosis not present

## 2018-02-20 DIAGNOSIS — I129 Hypertensive chronic kidney disease with stage 1 through stage 4 chronic kidney disease, or unspecified chronic kidney disease: Secondary | ICD-10-CM | POA: Diagnosis not present

## 2018-02-22 ENCOUNTER — Encounter (HOSPITAL_COMMUNITY)
Admission: RE | Disposition: A | Discharge status: Home / Self Care | Payer: Self-pay | Source: Ambulatory Visit | Attending: Orthopedic Surgery

## 2018-02-22 ENCOUNTER — Ambulatory Visit (HOSPITAL_COMMUNITY)
Admission: RE | Admit: 2018-02-22 | Discharge: 2018-02-23 | Disposition: A | Discharge status: Home / Self Care | Payer: Medicare Other | Source: Ambulatory Visit | Attending: Orthopedic Surgery | Admitting: Orthopedic Surgery

## 2018-02-22 ENCOUNTER — Ambulatory Visit (HOSPITAL_COMMUNITY): Payer: Medicare Other | Admitting: Registered Nurse

## 2018-02-22 ENCOUNTER — Encounter (HOSPITAL_COMMUNITY): Payer: Self-pay

## 2018-02-22 ENCOUNTER — Other Ambulatory Visit: Payer: Self-pay

## 2018-02-22 DIAGNOSIS — E1122 Type 2 diabetes mellitus with diabetic chronic kidney disease: Secondary | ICD-10-CM | POA: Diagnosis not present

## 2018-02-22 DIAGNOSIS — K219 Gastro-esophageal reflux disease without esophagitis: Secondary | ICD-10-CM | POA: Insufficient documentation

## 2018-02-22 DIAGNOSIS — Z955 Presence of coronary angioplasty implant and graft: Secondary | ICD-10-CM | POA: Insufficient documentation

## 2018-02-22 DIAGNOSIS — C50911 Malignant neoplasm of unspecified site of right female breast: Secondary | ICD-10-CM | POA: Insufficient documentation

## 2018-02-22 DIAGNOSIS — M109 Gout, unspecified: Secondary | ICD-10-CM | POA: Diagnosis not present

## 2018-02-22 DIAGNOSIS — Z7901 Long term (current) use of anticoagulants: Secondary | ICD-10-CM | POA: Diagnosis not present

## 2018-02-22 DIAGNOSIS — I509 Heart failure, unspecified: Secondary | ICD-10-CM | POA: Insufficient documentation

## 2018-02-22 DIAGNOSIS — N189 Chronic kidney disease, unspecified: Secondary | ICD-10-CM | POA: Insufficient documentation

## 2018-02-22 DIAGNOSIS — Z888 Allergy status to other drugs, medicaments and biological substances status: Secondary | ICD-10-CM | POA: Insufficient documentation

## 2018-02-22 DIAGNOSIS — Z794 Long term (current) use of insulin: Secondary | ICD-10-CM | POA: Insufficient documentation

## 2018-02-22 DIAGNOSIS — E1140 Type 2 diabetes mellitus with diabetic neuropathy, unspecified: Secondary | ICD-10-CM | POA: Insufficient documentation

## 2018-02-22 DIAGNOSIS — Z79899 Other long term (current) drug therapy: Secondary | ICD-10-CM | POA: Insufficient documentation

## 2018-02-22 DIAGNOSIS — F0390 Unspecified dementia without behavioral disturbance: Secondary | ICD-10-CM | POA: Insufficient documentation

## 2018-02-22 DIAGNOSIS — I2510 Atherosclerotic heart disease of native coronary artery without angina pectoris: Secondary | ICD-10-CM | POA: Diagnosis not present

## 2018-02-22 DIAGNOSIS — I130 Hypertensive heart and chronic kidney disease with heart failure and stage 1 through stage 4 chronic kidney disease, or unspecified chronic kidney disease: Secondary | ICD-10-CM | POA: Insufficient documentation

## 2018-02-22 DIAGNOSIS — N182 Chronic kidney disease, stage 2 (mild): Secondary | ICD-10-CM | POA: Insufficient documentation

## 2018-02-22 DIAGNOSIS — Z85820 Personal history of malignant melanoma of skin: Secondary | ICD-10-CM | POA: Insufficient documentation

## 2018-02-22 DIAGNOSIS — Z79891 Long term (current) use of opiate analgesic: Secondary | ICD-10-CM | POA: Insufficient documentation

## 2018-02-22 DIAGNOSIS — C50912 Malignant neoplasm of unspecified site of left female breast: Secondary | ICD-10-CM | POA: Insufficient documentation

## 2018-02-22 DIAGNOSIS — Z885 Allergy status to narcotic agent status: Secondary | ICD-10-CM | POA: Insufficient documentation

## 2018-02-22 DIAGNOSIS — M1990 Unspecified osteoarthritis, unspecified site: Secondary | ICD-10-CM | POA: Insufficient documentation

## 2018-02-22 DIAGNOSIS — M76821 Posterior tibial tendinitis, right leg: Secondary | ICD-10-CM | POA: Diagnosis not present

## 2018-02-22 DIAGNOSIS — Z886 Allergy status to analgesic agent status: Secondary | ICD-10-CM | POA: Insufficient documentation

## 2018-02-22 DIAGNOSIS — M67971 Unspecified disorder of synovium and tendon, right ankle and foot: Secondary | ICD-10-CM | POA: Diagnosis not present

## 2018-02-22 DIAGNOSIS — F419 Anxiety disorder, unspecified: Secondary | ICD-10-CM | POA: Insufficient documentation

## 2018-02-22 DIAGNOSIS — Z8673 Personal history of transient ischemic attack (TIA), and cerebral infarction without residual deficits: Secondary | ICD-10-CM | POA: Insufficient documentation

## 2018-02-22 DIAGNOSIS — Z79811 Long term (current) use of aromatase inhibitors: Secondary | ICD-10-CM | POA: Insufficient documentation

## 2018-02-22 DIAGNOSIS — E785 Hyperlipidemia, unspecified: Secondary | ICD-10-CM | POA: Insufficient documentation

## 2018-02-22 HISTORY — PX: ANKLE FUSION: SHX5718

## 2018-02-22 HISTORY — DX: Anemia, unspecified: D64.9

## 2018-02-22 LAB — GLUCOSE, CAPILLARY
Glucose-Capillary: 112 mg/dL — ABNORMAL HIGH (ref 70–99)
Glucose-Capillary: 116 mg/dL — ABNORMAL HIGH (ref 70–99)
Glucose-Capillary: 121 mg/dL — ABNORMAL HIGH (ref 70–99)
Glucose-Capillary: 137 mg/dL — ABNORMAL HIGH (ref 70–99)
Glucose-Capillary: 146 mg/dL — ABNORMAL HIGH (ref 70–99)

## 2018-02-22 LAB — HEMOGLOBIN A1C
Hgb A1c MFr Bld: 7.1 % — ABNORMAL HIGH (ref 4.8–5.6)
Mean Plasma Glucose: 157.07 mg/dL

## 2018-02-22 SURGERY — ANKLE FUSION
Anesthesia: General | Laterality: Right

## 2018-02-22 MED ORDER — GABAPENTIN 300 MG PO CAPS
300.0000 mg | ORAL_CAPSULE | Freq: Three times a day (TID) | ORAL | Status: DC
  Administered 2018-02-22 – 2018-02-23 (×3): 300 mg via ORAL
  Filled 2018-02-22 (×3): qty 1

## 2018-02-22 MED ORDER — CHLORHEXIDINE GLUCONATE 4 % EX LIQD: 60.0000 mL | Freq: Once | CUTANEOUS | Status: DC

## 2018-02-22 MED ORDER — CLOPIDOGREL BISULFATE 75 MG PO TABS
75.0000 mg | ORAL_TABLET | Freq: Every day | ORAL | Status: DC
  Administered 2018-02-23: 75 mg via ORAL
  Filled 2018-02-22: qty 1

## 2018-02-22 MED ORDER — ONDANSETRON HCL 4 MG/2ML IJ SOLN: 4.0000 mg | Freq: Once | INTRAMUSCULAR | Status: DC | PRN

## 2018-02-22 MED ORDER — FENTANYL CITRATE (PF) 250 MCG/5ML IJ SOLN
INTRAMUSCULAR | Status: AC
  Filled 2018-02-22: qty 5

## 2018-02-22 MED ORDER — METOPROLOL SUCCINATE ER 25 MG PO TB24
25.0000 mg | ORAL_TABLET | Freq: Every evening | ORAL | Status: DC
  Filled 2018-02-22: qty 1

## 2018-02-22 MED ORDER — ACETAMINOPHEN 325 MG PO TABS
325.0000 mg | ORAL_TABLET | Freq: Four times a day (QID) | ORAL | Status: DC | PRN
  Administered 2018-02-22: 650 mg via ORAL
  Filled 2018-02-22: qty 2

## 2018-02-22 MED ORDER — PROPOFOL 10 MG/ML IV BOLUS
INTRAVENOUS | Status: AC
  Filled 2018-02-22: qty 20

## 2018-02-22 MED ORDER — PANTOPRAZOLE SODIUM 40 MG PO TBEC
40.0000 mg | DELAYED_RELEASE_TABLET | Freq: Every day | ORAL | Status: DC
  Administered 2018-02-23: 40 mg via ORAL
  Filled 2018-02-22: qty 1

## 2018-02-22 MED ORDER — MIRTAZAPINE 15 MG PO TABS
22.5000 mg | ORAL_TABLET | Freq: Every day | ORAL | Status: DC
  Administered 2018-02-22: 22.5 mg via ORAL
  Filled 2018-02-22: qty 2

## 2018-02-22 MED ORDER — METHOCARBAMOL 1000 MG/10ML IJ SOLN
500.0000 mg | Freq: Four times a day (QID) | INTRAVENOUS | Status: DC | PRN
  Filled 2018-02-22: qty 5

## 2018-02-22 MED ORDER — 0.9 % SODIUM CHLORIDE (POUR BTL) OPTIME
TOPICAL | Status: DC | PRN
  Administered 2018-02-22: 1000 mL

## 2018-02-22 MED ORDER — OXYCODONE HCL 5 MG PO TABS
5.0000 mg | ORAL_TABLET | Freq: Once | ORAL | Status: AC | PRN
  Administered 2018-02-22: 5 mg via ORAL

## 2018-02-22 MED ORDER — POLYETHYLENE GLYCOL 3350 17 G PO PACK: 17.0000 g | PACK | Freq: Every day | ORAL | Status: DC | PRN

## 2018-02-22 MED ORDER — PRAMIPEXOLE DIHYDROCHLORIDE 0.125 MG PO TABS
0.5000 mg | ORAL_TABLET | Freq: Every day | ORAL | Status: DC
  Administered 2018-02-22: 0.5 mg via ORAL
  Filled 2018-02-22 (×2): qty 4

## 2018-02-22 MED ORDER — INSULIN ASPART 100 UNIT/ML ~~LOC~~ SOLN
4.0000 [IU] | Freq: Three times a day (TID) | SUBCUTANEOUS | Status: DC
  Administered 2018-02-23 (×2): 4 [IU] via SUBCUTANEOUS

## 2018-02-22 MED ORDER — LIDOCAINE 2% (20 MG/ML) 5 ML SYRINGE
INTRAMUSCULAR | Status: DC | PRN
  Administered 2018-02-22: 60 mg via INTRAVENOUS

## 2018-02-22 MED ORDER — ONDANSETRON HCL 4 MG/2ML IJ SOLN
INTRAMUSCULAR | Status: AC
  Filled 2018-02-22: qty 2

## 2018-02-22 MED ORDER — MAGNESIUM CITRATE PO SOLN: 1.0000 | Freq: Once | ORAL | Status: DC | PRN

## 2018-02-22 MED ORDER — MIDAZOLAM HCL 2 MG/2ML IJ SOLN
INTRAMUSCULAR | Status: AC
  Filled 2018-02-22: qty 2

## 2018-02-22 MED ORDER — ONDANSETRON HCL 4 MG/2ML IJ SOLN
INTRAMUSCULAR | Status: DC | PRN
  Administered 2018-02-22: 4 mg via INTRAVENOUS

## 2018-02-22 MED ORDER — INSULIN ASPART 100 UNIT/ML ~~LOC~~ SOLN
0.0000 [IU] | Freq: Three times a day (TID) | SUBCUTANEOUS | Status: DC
  Administered 2018-02-22 – 2018-02-23 (×2): 2 [IU] via SUBCUTANEOUS

## 2018-02-22 MED ORDER — DIAZEPAM 5 MG PO TABS: 5.0000 mg | ORAL_TABLET | Freq: Two times a day (BID) | ORAL | Status: DC | PRN

## 2018-02-22 MED ORDER — ONDANSETRON HCL 4 MG/2ML IJ SOLN: 4.0000 mg | Freq: Four times a day (QID) | INTRAMUSCULAR | Status: DC | PRN

## 2018-02-22 MED ORDER — PHENYLEPHRINE HCL 10 MG/ML IJ SOLN
INTRAMUSCULAR | Status: DC | PRN
  Administered 2018-02-22 (×2): 80 ug via INTRAVENOUS

## 2018-02-22 MED ORDER — SODIUM CHLORIDE 0.9 % IV SOLN
INTRAVENOUS | Status: DC | PRN
  Administered 2018-02-22: 50 ug/min via INTRAVENOUS

## 2018-02-22 MED ORDER — BISACODYL 10 MG RE SUPP: 10.0000 mg | Freq: Every day | RECTAL | Status: DC | PRN

## 2018-02-22 MED ORDER — NITROGLYCERIN 0.4 MG/SPRAY TL SOLN
1.0000 | Status: DC | PRN
  Filled 2018-02-22: qty 12

## 2018-02-22 MED ORDER — CEFAZOLIN SODIUM-DEXTROSE 2-4 GM/100ML-% IV SOLN
2.0000 g | INTRAVENOUS | Status: AC
  Administered 2018-02-22: 2 g via INTRAVENOUS
  Filled 2018-02-22: qty 100

## 2018-02-22 MED ORDER — BUMETANIDE 2 MG PO TABS
4.0000 mg | ORAL_TABLET | Freq: Every day | ORAL | Status: DC
  Administered 2018-02-23: 4 mg via ORAL
  Filled 2018-02-22: qty 2

## 2018-02-22 MED ORDER — OXYCODONE HCL 5 MG PO TABS
5.0000 mg | ORAL_TABLET | ORAL | Status: DC | PRN
  Administered 2018-02-22: 5 mg via ORAL
  Administered 2018-02-22: 10 mg via ORAL
  Administered 2018-02-23: 5 mg via ORAL
  Filled 2018-02-22 (×2): qty 1
  Filled 2018-02-22: qty 2

## 2018-02-22 MED ORDER — ONDANSETRON HCL 4 MG PO TABS: 4.0000 mg | ORAL_TABLET | Freq: Four times a day (QID) | ORAL | Status: DC | PRN

## 2018-02-22 MED ORDER — CEFAZOLIN SODIUM-DEXTROSE 1-4 GM/50ML-% IV SOLN
1.0000 g | Freq: Two times a day (BID) | INTRAVENOUS | Status: AC
  Administered 2018-02-22: 1 g via INTRAVENOUS
  Filled 2018-02-22: qty 50

## 2018-02-22 MED ORDER — SODIUM CHLORIDE 0.9 % IV SOLN
INTRAVENOUS | Status: DC | PRN
  Administered 2018-02-22: 10:00:00 via INTRAVENOUS

## 2018-02-22 MED ORDER — BUMETANIDE 2 MG PO TABS
2.0000 mg | ORAL_TABLET | ORAL | Status: DC
  Administered 2018-02-22: 2 mg via ORAL
  Filled 2018-02-22 (×3): qty 1

## 2018-02-22 MED ORDER — EXEMESTANE 25 MG PO TABS
25.0000 mg | ORAL_TABLET | Freq: Every day | ORAL | Status: DC
  Administered 2018-02-22 – 2018-02-23 (×2): 25 mg via ORAL
  Filled 2018-02-22 (×2): qty 1

## 2018-02-22 MED ORDER — DEXAMETHASONE SODIUM PHOSPHATE 10 MG/ML IJ SOLN
INTRAMUSCULAR | Status: AC
  Filled 2018-02-22: qty 1

## 2018-02-22 MED ORDER — SODIUM CHLORIDE 0.9 % IV SOLN
INTRAVENOUS | Status: DC
  Administered 2018-02-22: 16:00:00 via INTRAVENOUS

## 2018-02-22 MED ORDER — ALLOPURINOL 100 MG PO TABS
100.0000 mg | ORAL_TABLET | Freq: Two times a day (BID) | ORAL | Status: DC
  Administered 2018-02-22 – 2018-02-23 (×2): 100 mg via ORAL
  Filled 2018-02-22 (×2): qty 1

## 2018-02-22 MED ORDER — ROSUVASTATIN CALCIUM 10 MG PO TABS
20.0000 mg | ORAL_TABLET | Freq: Every day | ORAL | Status: DC
  Administered 2018-02-22: 20 mg via ORAL
  Filled 2018-02-22: qty 2

## 2018-02-22 MED ORDER — COLCHICINE 0.6 MG PO TABS: 0.6000 mg | ORAL_TABLET | Freq: Every day | ORAL | Status: DC | PRN

## 2018-02-22 MED ORDER — LIDOCAINE 2% (20 MG/ML) 5 ML SYRINGE
INTRAMUSCULAR | Status: AC
  Filled 2018-02-22: qty 5

## 2018-02-22 MED ORDER — METOCLOPRAMIDE HCL 5 MG PO TABS: 5.0000 mg | ORAL_TABLET | Freq: Three times a day (TID) | ORAL | Status: DC | PRN

## 2018-02-22 MED ORDER — CEREFOLIN NAC 6-2-600 MG PO TABS: 1.0000 | ORAL_TABLET | Freq: Every day | ORAL | Status: DC

## 2018-02-22 MED ORDER — DOCUSATE SODIUM 100 MG PO CAPS
100.0000 mg | ORAL_CAPSULE | Freq: Two times a day (BID) | ORAL | Status: DC
  Administered 2018-02-22 – 2018-02-23 (×2): 100 mg via ORAL
  Filled 2018-02-22 (×2): qty 1

## 2018-02-22 MED ORDER — METOCLOPRAMIDE HCL 5 MG/ML IJ SOLN: 5.0000 mg | Freq: Three times a day (TID) | INTRAMUSCULAR | Status: DC | PRN

## 2018-02-22 MED ORDER — PROPOFOL 10 MG/ML IV BOLUS
INTRAVENOUS | Status: DC | PRN
  Administered 2018-02-22: 100 mg via INTRAVENOUS

## 2018-02-22 MED ORDER — FENTANYL CITRATE (PF) 100 MCG/2ML IJ SOLN
25.0000 ug | INTRAMUSCULAR | Status: DC | PRN
  Administered 2018-02-22 (×2): 25 ug via INTRAVENOUS
  Administered 2018-02-22: 50 ug via INTRAVENOUS

## 2018-02-22 MED ORDER — EPHEDRINE SULFATE 50 MG/ML IJ SOLN
INTRAMUSCULAR | Status: DC | PRN
  Administered 2018-02-22: 2.5 mg via INTRAVENOUS
  Administered 2018-02-22: 5 mg via INTRAVENOUS

## 2018-02-22 MED ORDER — FENTANYL CITRATE (PF) 250 MCG/5ML IJ SOLN
INTRAMUSCULAR | Status: DC | PRN
  Administered 2018-02-22: 50 ug via INTRAVENOUS
  Administered 2018-02-22: 25 ug via INTRAVENOUS
  Administered 2018-02-22: 50 ug via INTRAVENOUS

## 2018-02-22 MED ORDER — PRAMIPEXOLE DIHYDROCHLORIDE 0.125 MG PO TABS
0.2500 mg | ORAL_TABLET | ORAL | Status: DC
  Administered 2018-02-22: 0.25 mg via ORAL

## 2018-02-22 MED ORDER — POTASSIUM CHLORIDE CRYS ER 20 MEQ PO TBCR
40.0000 meq | EXTENDED_RELEASE_TABLET | Freq: Two times a day (BID) | ORAL | Status: DC
  Administered 2018-02-22 – 2018-02-23 (×3): 40 meq via ORAL
  Filled 2018-02-22 (×3): qty 2

## 2018-02-22 MED ORDER — PROPOFOL 1000 MG/100ML IV EMUL
INTRAVENOUS | Status: AC
  Filled 2018-02-22: qty 500

## 2018-02-22 MED ORDER — OXYCODONE HCL 5 MG PO TABS
ORAL_TABLET | ORAL | Status: AC
  Filled 2018-02-22: qty 1

## 2018-02-22 MED ORDER — OXYCODONE HCL 5 MG PO TABS: 10.0000 mg | ORAL_TABLET | ORAL | Status: DC | PRN

## 2018-02-22 MED ORDER — METHOCARBAMOL 500 MG PO TABS
500.0000 mg | ORAL_TABLET | Freq: Four times a day (QID) | ORAL | Status: DC | PRN
  Administered 2018-02-22 – 2018-02-23 (×2): 500 mg via ORAL
  Filled 2018-02-22 (×2): qty 1

## 2018-02-22 MED ORDER — OXYCODONE HCL 5 MG/5ML PO SOLN: 5.0000 mg | Freq: Once | ORAL | Status: AC | PRN

## 2018-02-22 MED ORDER — FENTANYL CITRATE (PF) 100 MCG/2ML IJ SOLN
INTRAMUSCULAR | Status: AC
  Filled 2018-02-22: qty 2

## 2018-02-22 MED ORDER — HYDROMORPHONE HCL 1 MG/ML IJ SOLN: 0.5000 mg | INTRAMUSCULAR | Status: DC | PRN

## 2018-02-22 SURGICAL SUPPLY — 62 items
BANDAGE ACE 4X5 VEL STRL LF (GAUZE/BANDAGES/DRESSINGS) ×2 IMPLANT
BANDAGE ESMARK 6X9 LF (GAUZE/BANDAGES/DRESSINGS) ×1 IMPLANT
BIT DRILL KIT 2.65MM (BIT) IMPLANT
BLADE AVERAGE 25MMX9MM (BLADE) ×1
BLADE AVERAGE 25X9 (BLADE) ×2 IMPLANT
BLADE SURG 10 STRL SS (BLADE) IMPLANT
BNDG CMPR 9X6 STRL LF SNTH (GAUZE/BANDAGES/DRESSINGS) ×1
BNDG CMPR MED 10X6 ELC LF (GAUZE/BANDAGES/DRESSINGS) ×1
BNDG COHESIVE 4X5 TAN STRL (GAUZE/BANDAGES/DRESSINGS) ×3 IMPLANT
BNDG ELASTIC 6X10 VLCR STRL LF (GAUZE/BANDAGES/DRESSINGS) ×2 IMPLANT
BNDG ESMARK 6X9 LF (GAUZE/BANDAGES/DRESSINGS) ×3
BNDG GAUZE ELAST 4 BULKY (GAUZE/BANDAGES/DRESSINGS) ×4 IMPLANT
BUR EGG ELITE 4.0 (BURR) IMPLANT
BUR EGG ELITE 4.0MM (BURR)
COVER MAYO STAND STRL (DRAPES) IMPLANT
COVER SURGICAL LIGHT HANDLE (MISCELLANEOUS) ×6 IMPLANT
COVER WAND RF STERILE (DRAPES) ×3 IMPLANT
DRAPE OEC MINIVIEW 54X84 (DRAPES) ×2 IMPLANT
DRAPE U-SHAPE 47X51 STRL (DRAPES) ×3 IMPLANT
DRESSING PREVENA PLUS CUSTOM (GAUZE/BANDAGES/DRESSINGS) IMPLANT
DRILL KIT 2.65MM (BIT) ×3
DRSG ADAPTIC 3X8 NADH LF (GAUZE/BANDAGES/DRESSINGS) ×3 IMPLANT
DRSG PREVENA PLUS CUSTOM (GAUZE/BANDAGES/DRESSINGS) ×3
DURAPREP 26ML APPLICATOR (WOUND CARE) ×3 IMPLANT
ELECT REM PT RETURN 9FT ADLT (ELECTROSURGICAL) ×3
ELECTRODE REM PT RTRN 9FT ADLT (ELECTROSURGICAL) ×1 IMPLANT
GAUZE SPONGE 4X4 12PLY STRL (GAUZE/BANDAGES/DRESSINGS) ×3 IMPLANT
GLOVE BIOGEL M 6.5 STRL (GLOVE) ×2 IMPLANT
GLOVE BIOGEL M 7.0 STRL (GLOVE) ×2 IMPLANT
GLOVE BIOGEL M 9.0 STRL STRW (GLOVE) ×2 IMPLANT
GLOVE BIOGEL PI IND STRL 9 (GLOVE) ×1 IMPLANT
GLOVE BIOGEL PI INDICATOR 9 (GLOVE) ×2
GLOVE SS BIOGEL STRL SZ 9 (GLOVE) IMPLANT
GLOVE SUPERSENSE BIOGEL SZ 9 (GLOVE) ×4
GLOVE SURG ORTHO 9.0 STRL STRW (GLOVE) ×3 IMPLANT
GOWN STRL REUS W/ TWL LRG LVL3 (GOWN DISPOSABLE) ×1 IMPLANT
GOWN STRL REUS W/ TWL XL LVL3 (GOWN DISPOSABLE) ×1 IMPLANT
GOWN STRL REUS W/TWL LRG LVL3 (GOWN DISPOSABLE) ×9
GOWN STRL REUS W/TWL XL LVL3 (GOWN DISPOSABLE) ×3
GUIDEWIRE NONTHRD 300X2.8 (WIRE) ×4 IMPLANT
IMPL SPEED TITAN 20X20X20 (Orthopedic Implant) IMPLANT
IMPLANT SPEED TITAN 20X20X20 (Orthopedic Implant) ×6 IMPLANT
KIT BASIN OR (CUSTOM PROCEDURE TRAY) ×3 IMPLANT
KIT DRSG PREVENA PLUS 7DAY 125 (MISCELLANEOUS) ×2 IMPLANT
KIT TURNOVER KIT B (KITS) ×3 IMPLANT
NS IRRIG 1000ML POUR BTL (IV SOLUTION) ×3 IMPLANT
PACK ORTHO EXTREMITY (CUSTOM PROCEDURE TRAY) ×3 IMPLANT
PAD ARMBOARD 7.5X6 YLW CONV (MISCELLANEOUS) ×6 IMPLANT
PAD CAST 4YDX4 CTTN HI CHSV (CAST SUPPLIES) IMPLANT
PADDING CAST COTTON 4X4 STRL (CAST SUPPLIES) ×3
PUTTY BONE DBX 5CC MIX (Putty) ×2 IMPLANT
SCREW 6.5X60MM (Screw) ×2 IMPLANT
SCREW 6.5X70MM (Screw) ×2 IMPLANT
SPONGE LAP 18X18 X RAY DECT (DISPOSABLE) ×3 IMPLANT
SUCTION FRAZIER HANDLE 10FR (MISCELLANEOUS) ×2
SUCTION TUBE FRAZIER 10FR DISP (MISCELLANEOUS) ×1 IMPLANT
SUT ETHILON 2 0 PSLX (SUTURE) ×9 IMPLANT
TOWEL OR 17X24 6PK STRL BLUE (TOWEL DISPOSABLE) ×3 IMPLANT
TOWEL OR 17X26 10 PK STRL BLUE (TOWEL DISPOSABLE) ×3 IMPLANT
TUBE CONNECTING 12'X1/4 (SUCTIONS) ×1
TUBE CONNECTING 12X1/4 (SUCTIONS) ×2 IMPLANT
WATER STERILE IRR 1000ML POUR (IV SOLUTION) ×3 IMPLANT

## 2018-02-22 NOTE — Progress Notes (Signed)
Orthopedic Tech Progress Note Patient Details:  [name] 1940/05/29 [id number]  Ortho Devices Type of Ortho Device: CAM walker Ortho Device/Splint Location: delivered to room Ortho Device/Splint Interventions: Renard Matter 02/22/2018, 5:53 PM

## 2018-02-22 NOTE — H&P (Signed)
[name] is an 77 y.o. female.   Chief Complaint: Painful pronated valgus right foot. 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  Patient is undergone prolonged conservative care with fracture boots for the right lower extremity and still has impingement pain in the sinus Tarsi and still has pain over the posterior tibial tendon.  Patient now has to walk with a cane she states she is fallen twice.  For her gout she is currently on allopurinol 100 mg twice a day and patient states she is taking colchicine about 1-2 times a week.  Patient complains of hip pain with wearing the fracture boot.  She states that her hemoglobin A1c is now above 7.  She states her doctor recommended she eat potato a day.  Most recent uric acid was 8.7 uric acid repeated today.  Past Medical History:  Diagnosis Date  . Anemia    have received iron infusions  . Anxiety   . Arthritis     osteoarthritis  . Breast cancer ([place])   . Breast cancer in female [facility])    Bilateral  . CAD S/P percutaneous coronary angioplasty 2007; March 2011   [facility] 3.0 mm 20 mm postdilated 3.6 mm in early mid LAD; status post ISR Cutting Balloon PTCA and March 11 along with PCI of distal mid lesion with a 3.0 mm 12 mm MultiLink vision BMS; the proximal stent causes jailing of SP1 and SP2 with ostial 70-80% lesions  . Cancer (HCC)    Melanoma, Squamous cell Carcinoma  . CHF (congestive heart failure) ([place])   . Chronic kidney disease    stage 2   . Dementia ([place])    mild  . Diabetes mellitus type 2 with neurological manifestations ([place])   . Dyslipidemia, goal LDL below 70   . Full dentures   . Genetic testing 12/03/2016   Germline genetic testing was performed through [facility] Common Hereditary Cancers Panel + [facility] Melanoma Panel. This custom panel includes analysis of the following 51 genes: APC, ATM, AXIN2, BAP1, BARD1, BMPR1A, BRCA1, BRCA2, BRIP1, CDH1, CDK4, CDKN2A, CHEK2, CTNNA1,  DICER1, EPCAM, GREM1, HOXB13, KIT, MEN1, MITF, MLH1, MSH2, MSH3, MSH6, MUTYH, NBN, NF1, NTHL1, PALB2, PDGFRA, PMS2, POLD1, POL  . GERD (gastroesophageal reflux disease)   . Headache(784.0)   . History of hematuria    Followed by Dr. Gaynelle Arabian  . Hypertension, essential, benign   . Nausea & vomiting 10/2015  . Peripheral neuropathy   . Stroke ([place])   . TIA (transient ischemic attack)    multiple in the past  . Wears glasses     Past Surgical History:  Procedure Laterality Date  . ABDOMINAL HYSTERECTOMY    . APPENDECTOMY    . BLADDER SUSPENSION    . BREAST LUMPECTOMY Bilateral 2018  . BREAST LUMPECTOMY WITH RADIOACTIVE SEED AND SENTINEL LYMPH NODE BIOPSY Bilateral 12/14/2016   Procedure: BILATERAL BREAST LUMPECTOMY WITH BILATERAL  RADIOACTIVE SEED AND BILATERAL AXILLARY  SENTINEL LYMPH NODE BIOPSY ERAS PATHWAY;  Surgeon: Alphonsa Overall, MD;  Location: [facility];  Service: General;  Laterality: Bilateral;  PECTORAL BLOCK  . CARDIAC CATHETERIZATION  02/24/2006   85% stenosis in the proximal portion of LAD-arrangements made for PCI on 02/25/2006  . CARDIAC CATHETERIZATION  02/25/2006   80% LAD lesion stented with a 3x34m Liberte stent resulting in reduction of 80% lesion to 0% residual  . CARDIAC CATHETERIZATION  04/26/2006   Medical management  . CARDIAC CATHETERIZATION  06/24/2009  60-70% re-stenosis in the proximal LAD. A 3.25x15 cutting balloon, 3 inflations - 14atm-38sec, 13atm-39sec, and 12atm-40sec reduced to less than 10%. 60% stenosis of the mid/distal LAD stented with a 3x30m Multilink stent.  .Marland KitchenCARDIAC CATHETERIZATION  07/09/2009   Medical management  . CAROTID DOPPLER  06/24/2009   40-59% R ICA stenosis. No significant ICA stenosis noted  . CHOLECYSTECTOMY    . DILATION AND CURETTAGE OF UTERUS    . JOINT REPLACEMENT Left   . LESION REMOVAL Right 03/11/2015   Procedure:  EXCISION OF RIGHT PRE TIBIAL LESION;  Surgeon: JJudeth Horn MD;  [place] [facility]   Service: General;  Laterality: Right;  . MASS EXCISION Left 06/10/2014   Procedure: EXCISION LEFT LOWER LEG LESION;  Surgeon: JDoreen Salvage MD;  [place] [facility]  Service: General;  Laterality: Left;  .Marland KitchenMASS EXCISION Left 09/05/2015   Procedure: EXCISION OF LEFT FOREARM  MASS;  Surgeon: JJudeth Horn MD;  [place] [facility]  Service: General;  Laterality: Left;  . NM MYOVIEW LTD  11/16/2011   5 beats a PVC during recovery.  No skin or infarction.  Reached 5 METs, EKG negative for ischemia   . NM MYOVIEW LTD  10/2011   No ischemia or infarction.  Normal EF.  LOW RISK. (Short bursts of 5 beats NSVT during recovery otherwise normal)  . SHOULDER ARTHROSCOPY  10/15  . SHOULDER ARTHROSCOPY  1995   left  . TEE WITHOUT CARDIOVERSION N/A 08/03/2017   Procedure: TRANSESOPHAGEAL ECHOCARDIOGRAM (TEE);  Surgeon: RSkeet Latch MD;  [place]  Service: Cardiovascular;; EF 60-65% with no wall motion normalities.  No atrial thrombus noted.  Lightly findings consistent with a lipomatous hypertrophy of the atrial septum.   . TENDON REPAIR Left 05/12/2016   Procedure: Left Anterior Tibial Tendon Reconstruction;  Surgeon: MNewt Minion MD;  [place]  Service: Orthopedics;  Laterality: Left;  . TOTAL KNEE ARTHROPLASTY  2005   left  . TRANSESOPHAGEAL ECHOCARDIOGRAM  08/03/2016   : EF 60-65% no wall motion normalities.  No atrial thrombus.  Lipomatous hypertrophy of the atrial septum.  No mass noted.  . TRANSTHORACIC ECHOCARDIOGRAM  10/2015   EF 55-60 %. Mild LVH. Mild pulmonary hypertension. Normal valves.  . TRANSTHORACIC ECHOCARDIOGRAM  07/2017   Vigorous LV function.  EF 65-70%.  Mild diastolic dysfunction with no RWMA. GR 1 DD.  Mild aortic sclerosis/no stenosis.  Questionable right atrial mass discounted with TEE)   . WRIST ARTHROPLASTY  2010   cancer lt wrist    Family History  Problem Relation Age of Onset  . Diabetes Mother   . Hypertension  Mother   . Kidney disease Mother   . Hypertension Father   . Emphysema Father   . Heart attack Father   . Heart disease Father   . Arthritis Sister   . Diabetes Sister   . Hypertension Sister   . Breast cancer Sister 348      treated with mastectomy  . Cervical cancer Sister 231 . Hypertension Brother   . Hyperlipidemia Brother   . Diabetes Brother   . Stroke Brother   . Diabetes Sister   . Hypertension Sister   . Stomach cancer Sister 680 . Hypertension Brother   . ALS Brother        d.62s  . Breast cancer Daughter 422      triple negative treated with lumpectomy, chemo, radiation  . Heart attack  Daughter 57       d.45  . COPD Daughter   . Cancer Paternal Aunt        unspecified type  . Cancer Paternal Uncle        unspecified type  . Cancer Paternal Uncle        unspecified type   Social History:  reports that she has never smoked. She has never used smokeless tobacco. She reports that she does not drink alcohol or use drugs.  Allergies:  Allergies  Allergen Reactions  . Ultram [Tramadol] Other (See Comments)    Pt has seizure activity with this medication  . Lipitor [Atorvastatin] Other (See Comments)    Bone and muscle pain  . Lyrica [Pregabalin] Other (See Comments)    Weight gain, extremity swelling  . Reglan [Metoclopramide] Other (See Comments)    Tardive dyskinesia; paradoxical reaction not relieved by benadryl  . Nsaids Swelling    SWELLING REACTION UNSPECIFIED     No medications prior to admission.    No results found for this or any previous visit (from the past 48 hour(s)). No results found.  Review of Systems  All other systems reviewed and are negative.   There were no vitals taken for this visit. Physical Exam  Patient is alert, oriented, no adenopathy, well-dressed, normal affect, normal respiratory effort. Examination patient has good pulses she has a pronated valgus hindfoot which is getting worse.  She has skin breakdown from the  posterior tibial tendon insufficiency medially.  She has tenderness to palpation in the sinus Tarsi she has tenderness to palpation along the posterior tibial tendon she cannot do a single limb heel raise she has a positive too many toes sign.  She has a good dorsalis pedis pulse.  She does have venous stasis swelling. Assessment/Plan 1. Pain in right ankle and joints of right foot   2. Posterior tibial tendinitis, right leg     Plan: Discussed with the patient continued conservative therapy versus surgical intervention.  Discussed the risks of infection wound not healing bone nonhealing persistent pain need for additional surgery.  Patient states she understands she states she cannot go on living like this and wishes to proceed with surgery.  Will plan for a talonavicular and subtalar fusion.     Newt Minion, MD 02/22/2018, 6:37 AM

## 2018-02-22 NOTE — Anesthesia Postprocedure Evaluation (Signed)
Anesthesia Post Note  Patient: [name]  Procedure(s) Performed: RIGHT SUBTALAR AND TALONAVICULAR FUSION (Right )     Patient location during evaluation: PACU Anesthesia Type: General Level of consciousness: awake and alert Pain management: pain level controlled Vital Signs Assessment: post-procedure vital signs reviewed and stable Respiratory status: spontaneous breathing, nonlabored ventilation, respiratory function stable and patient connected to nasal cannula oxygen Cardiovascular status: blood pressure returned to baseline and stable Postop Assessment: no apparent nausea or vomiting Anesthetic complications: no    Last Vitals:  Vitals:   02/22/18 1218 02/22/18 1233  BP: 105/62 97/60  Pulse: 83 84  Resp: 18 18  Temp:    SpO2: 98% 97%    Last Pain:  Vitals:   02/22/18 1223  TempSrc:   PainSc: [name]

## 2018-02-22 NOTE — Op Note (Signed)
02/22/2018  11:33 AM  PATIENT:  [name]    PRE-OPERATIVE DIAGNOSIS:  Right Posteior Tibial Tendon Insufficiency  POST-OPERATIVE DIAGNOSIS:  Same  PROCEDURE:  RIGHT SUBTALAR AND TALONAVICULAR FUSION   SURGEON:  Newt Minion, MD  PHYSICIAN ASSISTANT:None ANESTHESIA:   General  PREOPERATIVE INDICATIONS:  [name] is a  77 y.o. female with a diagnosis of Right Posteior Tibial Tendon Insufficiency who failed conservative measures and elected for surgical management.    The risks benefits and alternatives were discussed with the patient preoperatively including but not limited to the risks of infection, bleeding, nerve injury, cardiopulmonary complications, the need for revision surgery, among others, and the patient was willing to proceed.  OPERATIVE IMPLANTS: 20 x 20 mm staples x2, 6.5 headless cannulated screws 60 and 70 mm.  @ENCIMAGES @  OPERATIVE FINDINGS: C-arm fluoroscopy shows stable alignment in both AP and lateral planes.  OPERATIVE PROCEDURE: Patient was brought to the operating room and underwent general anesthetic.  After adequate levels anesthesia were obtained patient's right lower extremity was prepped using DuraPrep draped into a sterile field a timeout was called.  AThe tendons and neurovascular bundles were protected and a bur was used to debride the posterior facet this was debrided back to bleeding viable subchondral bone. oblique incision was made over the sinus Tarsi this was carried down to the subtalar joint tension was then focused on the talonavicular joint.  A dorsal incision was made between the anterior tibial tendon and the EHL.  This was carried sharply down to bone medial lateral retractors were placed and the electrical bur was used to debride the talonavicular joint.  The talonavicular joint was then reduced and the foot was taken out of the pronation and valgus with neutral alignment through the medial column this was then stabilized with 2 staples.   Attention was then focused on the subtalar joint.  2 guidewires were inserted to stabilize the subtalar joint and the 2 screws were inserted 60 and 70 mm in length.  C-arm fluoroscopy verified alignment there was good range of motion after the hardware was inserted.  The wounds were irrigated with normal saline electrocautery was used for hemostasis the areas of compression were filled with 5 cc of demineralized bone matrix and putty.  The incisions were closed using 2-0 nylon.  A Praveena wound VAC was applied.  This had a good suction fit compression wrap was then applied for the metatarsal heads to the tibial tubercle due to the venous swelling.  Patient was extubated taken to PACU in stable condition.   DISCHARGE PLANNING:  Antibiotic duration: Antibiotics for 24 hours  Weightbearing: Nonweightbearing on the right  Pain medication: Opioid pathway  Dressing care/ Wound VAC: Wound VAC for 1 week  Ambulatory devices: Walker  Discharge to: 23-hour observation discharge to home.  Follow-up: In the office 1 week post operative.

## 2018-02-22 NOTE — Progress Notes (Signed)
PHARMACY NOTE:  ANTIMICROBIAL RENAL DOSAGE ADJUSTMENT  Current antimicrobial regimen includes a mismatch between antimicrobial dosage and estimated renal function.  As per policy approved by the Pharmacy & Therapeutics and Medical Executive Committees, the antimicrobial dosage will be adjusted accordingly.  Current antimicrobial dosage:  Ancef 1gm IV Q6H x 3 doses  Indication: surgical prophylaxis  Renal Function:  Estimated Creatinine Clearance: 20.6 mL/min (A) (by C-G formula based on SCr of 2.45 mg/dL (H)). []      On intermittent HD, scheduled: []      On CRRT    Antimicrobial dosage has been changed to:  Ancef 1gm IV Q12H x 1 dose, 12 hours post pre-op dose, will last ~24 hrs post AET  Additional comments:   Riyan Haile D. Mina Marble, PharmD, BCPS, [place] 02/22/2018, 3:03 PM

## 2018-02-22 NOTE — Transfer of Care (Signed)
Immediate Anesthesia Transfer of Care Note  Patient: [name]  Procedure(s) Performed: RIGHT SUBTALAR AND TALONAVICULAR FUSION (Right )  Patient Location: PACU  Anesthesia Type:General  Level of Consciousness: awake, drowsy and patient cooperative  Airway & Oxygen Therapy: Patient Spontanous Breathing and Patient connected to nasal cannula oxygen  Post-op Assessment: Report given to RN and Post -op Vital signs reviewed and stable  Post vital signs: Reviewed and stable  Last Vitals:  Vitals Value Taken Time  BP 115/68 02/22/2018 11:33 AM  Temp    Pulse 88 02/22/2018 11:34 AM  Resp 19 02/22/2018 11:34 AM  SpO2 97 % 02/22/2018 11:34 AM  Vitals shown include unvalidated device data.  Last Pain:  Vitals:   02/22/18 0740  TempSrc:   PainSc: 3       Patients Stated Pain Goal: 2 (70/01/74 9449)  Complications: No apparent anesthesia complications

## 2018-02-22 NOTE — Plan of Care (Signed)

## 2018-02-22 NOTE — Anesthesia Procedure Notes (Signed)
Procedure Name: LMA Insertion Date/Time: 02/22/2018 10:30 AM Performed by: Jearld Pies, CRNA Pre-anesthesia Checklist: Patient identified, Emergency Drugs available, Suction available and Patient being monitored Patient Re-evaluated:Patient Re-evaluated prior to induction Oxygen Delivery Method: Circle System Utilized Preoxygenation: Pre-oxygenation with 100% oxygen Induction Type: IV induction Ventilation: Mask ventilation without difficulty LMA: LMA inserted LMA Size: 4.0 Number of attempts: 1 Airway Equipment and Method: Bite block Placement Confirmation: positive ETCO2 Tube secured with: Tape Dental Injury: Teeth and Oropharynx as per pre-operative assessment

## 2018-02-22 NOTE — Anesthesia Preprocedure Evaluation (Addendum)
Anesthesia Evaluation  Patient identified by MRN, date of birth, ID band Patient awake    Reviewed: Allergy & Precautions, NPO status , Patient's Chart, lab work & pertinent test results  History of Anesthesia Complications Negative for: history of anesthetic complications  Airway Mallampati: II  TM Distance: >3 FB Neck ROM: Full    Dental  (+) Edentulous Upper, Edentulous Lower   Pulmonary neg pulmonary ROS,    breath sounds clear to auscultation       Cardiovascular hypertension, Pt. on medications and Pt. on home beta blockers + CAD and + Cardiac Stents   Rhythm:Regular Rate:Normal   '19 TEE - EF 60% to 65%. No valvulopathy reported.    Neuro/Psych  Headaches, PSYCHIATRIC DISORDERS Anxiety Dementia TIA Neuromuscular disease (peripheral neuropathy)    GI/Hepatic Neg liver ROS, GERD  Controlled,  Endo/Other  diabetes, Insulin Dependent Obesity   Renal/GU CRFRenal disease     Musculoskeletal  (+) Arthritis ,   Abdominal   Peds  Hematology negative hematology ROS (+)   Anesthesia Other Findings   Reproductive/Obstetrics                            Anesthesia Physical Anesthesia Plan  ASA: III  Anesthesia Plan: General   Post-op Pain Management:    Induction: Intravenous  PONV Risk Score and Plan: 4 or greater and Treatment may vary due to age or medical condition and Ondansetron  Airway Management Planned: LMA  Additional Equipment: None  Intra-op Plan:   Post-operative Plan: Extubation in OR  Informed Consent: I have reviewed the patients History and Physical, chart, labs and discussed the procedure including the risks, benefits and alternatives for the proposed anesthesia with the patient or authorized representative who has indicated his/her understanding and acceptance.   Dental advisory given  Plan Discussed with: CRNA and Anesthesiologist  Anesthesia Plan  Comments:        Anesthesia Quick Evaluation

## 2018-02-23 LAB — GLUCOSE, CAPILLARY
Glucose-Capillary: 131 mg/dL — ABNORMAL HIGH (ref 70–99)
Glucose-Capillary: 119 mg/dL — ABNORMAL HIGH (ref 70–99)

## 2018-02-23 MED ORDER — OXYCODONE-ACETAMINOPHEN 5-325 MG PO TABS: 1.0000 | ORAL_TABLET | ORAL | 0 refills | Status: DC | PRN

## 2018-02-23 NOTE — Progress Notes (Signed)
Patient suffers from subtalar, talonavicular fusion with right lower extremity nonweightbearing status which impairs their ability to perform daily activities like ADL's and mobility in the home.  A walker alone will not resolve the issues with performing activities of daily living. A wheelchair will allow patient to safely perform daily activities.  The patient can self propel in the home or has a caregiver who can provide assistance.     Ellamae Sia, PT, DPT Acute Rehabilitation Services Pager [phone] Office [phone]

## 2018-02-23 NOTE — Care Management Note (Signed)
Case Management Note Marvetta Gibbons RN, BSN Transitions of Care - RN Case Manager [phone]  Patient Details  Name: [name] MRN: [id number] Date of Birth: October 25, 1940  Subjective/Objective:    Pt admitted s/p RIGHT SUBTALAR AND TALONAVICULAR FUSION on 02/22/2018                  Action/Plan: PTA pt lived at home, recommendations for [facility] and DME, order placed for w/c and HHPT- spoke with pt at bedside- for [facility] choice- per pt she has used [place] in the past and wants to use them again- explained they are now [facility] which pt understands and still wants to use them for [facility] needs- referral called to [place] with [facility]- referral accepted. Call made to [facility] for DME need of w/c- provided w/c from storage room and CM delivered to room. Pt will also go home with Prevena wound VAC- bedside RN to take care of changing canister on vac prior to discharge.   Expected Discharge Date:  02/23/18               Expected Discharge Plan:  [place]  In-House Referral:     Discharge planning Services  CM Consult  Post Acute Care Choice:  Durable Medical Equipment, Home Health Choice offered to:  Patient  DME Arranged:  Wheelchair manual DME Agency:  [place]:  PT [place]:  [facility]  Status of Service:  Completed, signed off  If discussed at [employer] of Stay Meetings, dates discussed:    Discharge Disposition: home/home health   Additional Comments:  Dawayne Patricia, RN 02/23/2018, 1:32 PM 5N [employer] Coverage [phone]

## 2018-02-23 NOTE — Discharge Summary (Signed)
Discharge Diagnoses:  Active Problems:   Posterior tibial tendinitis, right leg   Posterior tibial tendinitis of right leg   Surgeries: Procedure(s): RIGHT SUBTALAR AND TALONAVICULAR FUSION on 02/22/2018    Consultants:   Discharged Condition: Improved  Hospital Course: [name] is an 77 y.o. female who was admitted 02/22/2018 with a chief complaint of pronated valgus forefoot, with a final diagnosis of Right Posteior Tibial Tendon Insufficiency.  Patient was brought to the operating room on 02/22/2018 and underwent Procedure(s): RIGHT SUBTALAR AND TALONAVICULAR FUSION.    Patient was given perioperative antibiotics:  Anti-infectives (From admission, onward)   Start     Dose/Rate Route Frequency Ordered Stop   02/22/18 2230  ceFAZolin (ANCEF) IVPB 1 g/50 mL premix     1 g 100 mL/hr over 30 Minutes Intravenous Every 12 hours 02/22/18 1443 02/22/18 2247   02/22/18 0730  ceFAZolin (ANCEF) IVPB 2g/100 mL premix     2 g 200 mL/hr over 30 Minutes Intravenous On call to O.R. 02/22/18 1829 02/22/18 1041    .  Patient was given sequential compression devices, early ambulation, and aspirin for DVT prophylaxis.  Recent vital signs:  Patient Vitals for the past 24 hrs:  BP Temp Temp src Pulse Resp SpO2  02/23/18 0334 120/74 98.4 F (36.9 C) Oral 94 16 98 %  02/22/18 2028 100/61 - - (!) 114 16 91 %  02/22/18 2025 - (!) 102.5 F (39.2 C) Oral - - -  02/22/18 1932 (!) 93/45 (!) 101.2 F (38.4 C) Oral (!) 115 14 92 %  02/22/18 1635 112/62 98.7 F (37.1 C) Oral (!) 108 18 98 %  02/22/18 1451 (!) 144/65 98 F (36.7 C) Oral 99 17 92 %  02/22/18 1350 97/82 - - - - -  02/22/18 1248 (!) 100/55 - - 82 18 99 %  02/22/18 1233 97/60 - - 84 18 97 %  02/22/18 1218 105/62 - - 87 18 99 %  02/22/18 1203 103/65 - - 84 18 96 %  02/22/18 1148 111/67 - - 85 20 98 %  02/22/18 1133 115/68 97.7 F (36.5 C) - 87 13 97 %  .  Recent laboratory studies: No results found.  Discharge Medications:    Allergies as of 02/23/2018      Reactions   Ultram [tramadol] Other (See Comments)   Pt has seizure activity with this medication   Lipitor [atorvastatin] Other (See Comments)   Bone and muscle pain   Lyrica [pregabalin] Other (See Comments)   Weight gain, extremity swelling   Reglan [metoclopramide] Other (See Comments)   Tardive dyskinesia; paradoxical reaction not relieved by benadryl   Nsaids Swelling   SWELLING REACTION UNSPECIFIED       Medication List    TAKE these medications   allopurinol 100 MG tablet Commonly known as:  ZYLOPRIM Take 1 tablet (100 mg total) by mouth 2 (two) times daily.   bumetanide 2 MG tablet Commonly known as:  BUMEX Take 2-4 mg by mouth See admin instructions. Take 2 tablets (4 mg) by mouth in the morning, take 1 tablet (2 mg) at 2pm, & take 1 tablet (2 mg) by mouth with supper.   CALCIUM 600+D3 600-800 MG-UNIT Tabs Generic drug:  Calcium Carb-Cholecalciferol Take 1 tablet by mouth daily.   CEREFOLIN NAC 6-2-600 MG Tabs TAKE 1 CAPLET BY MOUTH ONCE DAILY. What changed:    how much to take  how to take this  when to take this  additional instructions  clopidogrel 75 MG tablet Commonly known as:  PLAVIX TAKE 1 TABLET DAILY   COD LIVER OIL PO Take 415 mg by mouth daily.   colchicine 0.6 MG tablet Take 1 tablet (0.6 mg total) by mouth daily. PRN gout flare What changed:    when to take this  reasons to take this  additional instructions   Cranberry 500 MG Tabs Take 500 mg by mouth every evening.   diazepam 5 MG tablet Commonly known as:  VALIUM Take 5 mg by mouth 2 (two) times daily as needed for anxiety.   exemestane 25 MG tablet Commonly known as:  AROMASIN TAKE 1 TABLET DAILY AFTER BREAKFAST What changed:  See the new instructions.   gabapentin 300 MG capsule Commonly known as:  NEURONTIN Take 1 capsule (300 mg total) by mouth 2 (two) times daily. Take 1 during the day if needed and 2 at night What changed:     when to take this  additional instructions   insulin NPH Human 100 UNIT/ML injection Commonly known as:  HUMULIN N,NOVOLIN N Inject 20-25 Units into the skin See admin instructions. Inject 20 units in the morning & inject 25 units after supper.   Krill Oil 500 MG Caps Take 500 mg by mouth daily.   metolazone 2.5 MG tablet Commonly known as:  ZAROXOLYN Take 30 min before the morning dose of bumex as directed daily. What changed:    how much to take  how to take this  when to take this  additional instructions   metoprolol succinate 50 MG 24 hr tablet Commonly known as:  TOPROL-XL TAKE ONE-HALF (1/2) TABLET DAILY What changed:    how much to take  how to take this  when to take this  additional instructions   mirtazapine 15 MG tablet Commonly known as:  REMERON Take 22.5 mg by mouth at bedtime.   NITROMIST 400 MCG/SPRAY Aers Generic drug:  Nitroglycerin Place 1 spray onto the tongue every 5 (five) minutes x 3 doses as needed (chest pain.).   oxyCODONE-acetaminophen 5-325 MG tablet Commonly known as:  PERCOCET/ROXICET Take 1 tablet by mouth every 4 (four) hours as needed. What changed:  reasons to take this   pantoprazole 40 MG tablet Commonly known as:  PROTONIX TAKE 1 TABLET DAILY (CONTACT OFFICE FOR ADDITIONAL REFILLS, FINAL ATTEMPT) What changed:  See the new instructions.   potassium chloride SA 20 MEQ tablet Commonly known as:  K-DUR,KLOR-CON Take 40 mEq by mouth 2 (two) times daily.   pramipexole 0.25 MG tablet Commonly known as:  MIRAPEX TAKE 1 TABLET AT 4:00 P.M. AND 2 TABLETS (0.5 MG) AT BEDTIME What changed:    how much to take  how to take this  when to take this   QUNOL COQ10/UBIQUINOL/MEGA 100 MG Caps Generic drug:  Ubiquinol Take 100 mg by mouth every evening. CoQ10, Qunol   RESERVAPAK PO Take 1 tablet by mouth every evening.   rosuvastatin 20 MG tablet Commonly known as:  CRESTOR Take 20 mg by mouth daily with supper.             Discharge Care Instructions  (From admission, onward)         Start     Ordered   02/23/18 0000  Non weight bearing    Question Answer Comment  Laterality right   Extremity Lower      02/23/18 0849          Diagnostic Studies: No results found.  Patient benefited maximally  from their hospital stay and there were no complications.     Disposition:  Discharge Instructions    Negative Pressure Wound Therapy - Incisional   Complete by:  As directed    Attach a new wound VAC canister to the Praveena pump.  Show patient how to  plug the unit in to maintain the charge.   Non weight bearing   Complete by:  As directed    Laterality:  right   Extremity:  Lower     Follow-up Information    Newt Minion, MD In 1 week.   Specialty:  Orthopedic Surgery Contact information: [address] [place] [address] [phone]            Signed: Newt Minion 02/23/2018, 8:49 AM

## 2018-02-23 NOTE — Evaluation (Addendum)
Physical Therapy Evaluation Patient Details Name: [name] MRN: [id number] DOB: 03-Jan-1941 Today's Date: 02/23/2018   History of Present Illness  Patient is a 77 y/o female who presents with right posterior tib tendon insufficiency now s/p subtalar talonavicular fusion. PMH includes chronic CHF, CAD, DM, breast ca, CKD, dementia, HTN, neuropathy.,   Clinical Impression  Patient presents with pain, impaired memory, post surgical deficits and decreased mobility secondary to NWB status RLE and above surgery. Pt lives at home with spouse and daughter (CNA) will be staying with her as well. Daughter plans on bumping pt up stairs in w/c- encouraged her to get temporary ramp for easier access. Pt tolerated bed mobility and squat pivot transfer to chair with Mod A and cues to adhere to NWB status RLE. Anticipate pt will do better with drop arm w/c for transfers. Education re: elevation, edema control, exercises, safety at home, ramp for access, assist with transfers, CAM boot, WB status etc. Will follow acutely to maximize independence and mobility prior to return home.     Follow Up Recommendations Home health PT;Supervision for mobility/OOB    Equipment Recommendations  Rolling walker with 5" wheels;Wheelchair (measurements PT);Wheelchair cushion (measurements PT);3in1 (PT)(w/c need to have elevating leg rests)    Recommendations for Other Services       Precautions / Restrictions Precautions Precautions: Fall Required Braces or Orthoses: Other Brace Other Brace: CAM boot RLE Restrictions Weight Bearing Restrictions: Yes RLE Weight Bearing: Non weight bearing      Mobility  Bed Mobility Overal bed mobility: Needs Assistance Bed Mobility: Supine to Sit     Supine to sit: Min guard;HOB elevated     General bed mobility comments: Able to get to EOB without assist; use of rail.   Transfers Overall transfer level: Needs assistance Equipment used: Rolling walker (2  wheeled);None Transfers: Sit to/from [employer] Sit to Stand: Mod assist   Squat pivot transfers: Mod assist     General transfer comment: Attempted standing with RW for support however pt requires Mod A to power up, cues for hand placement/technique. Then performed squat pivot transfer towards chair with Mod A while maintaining NWB through RLE. 2/4 DOE. Cues for breathing. Sp02 all over the place poor reading. Left with SP02 in 90s on RA.  Ambulation/Gait             General Gait Details: Deferred  Stairs            Wheelchair Mobility    Modified Rankin (Stroke Patients Only)       Balance Overall balance assessment: Needs assistance;History of Falls Sitting-balance support: Feet supported;No upper extremity supported Sitting balance-Leahy Scale: Fair     Standing balance support: During functional activity;Bilateral upper extremity supported Standing balance-Leahy Scale: Poor Standing balance comment: Requires UE and external support for standing balance due to NWB RLE.                             Pertinent Vitals/Pain Pain Assessment: Faces Faces Pain Scale: Hurts little more Pain Location: RLE Pain Descriptors / Indicators: Sore;Aching Pain Intervention(s): Monitored during session;Repositioned;Limited activity within patient's tolerance;RN gave pain meds during session    [place] expects to be discharged to:: Private residence Living Arrangements: Spouse/significant other;Children Available Help at Discharge: Family;Available 24 hours/day Type of Home: House Home Access: Stairs to enter Entrance Stairs-Rails: Psychiatric nurse of Steps: 4 Home Layout: One level Home Equipment: Environmental consultant -  4 wheels      Prior Function Level of Independence: Needs assistance   Gait / Transfers Assistance Needed: Has been doing well with mobility up until a few weeks ago, borrowed rollator from church.  reports multiple falls at home since foot collapsed.  ADL's / Homemaking Assistance Needed: Does her own ADLs., Spouse.daughter do IADls.  Comments: Prior to a few weeks ago, very active, cooking/cleaning, getting on tractor     Hand Dominance   Dominant Hand: Right    Extremity/Trunk Assessment   Upper Extremity Assessment Upper Extremity Assessment: Defer to OT evaluation    Lower Extremity Assessment Lower Extremity Assessment: RLE deficits/detail;Generalized weakness RLE Deficits / Details: swelling present in toes; able to assist lifting RLE off bed RLE Sensation: decreased light touch       Communication   Communication: No difficulties  Cognition Arousal/Alertness: Awake/alert Behavior During Therapy: WFL for tasks assessed/performed Overall Cognitive Status: Impaired/Different from baseline Area of Impairment: Safety/judgement;Memory                     Memory: Decreased short-term memory   Safety/Judgement: Decreased awareness of safety     General Comments: Question safety awareness? Also with impaired memory- not able to recall NWB within session      General Comments General comments (skin integrity, edema, etc.): Daughter present during session- CNA.    Exercises General Exercises - Lower Extremity Ankle Circles/Pumps: Right;10 reps;Supine(toe) Quad Sets: Right;10 reps;Supine   Assessment/Plan    PT Assessment Patient needs continued PT services  PT Problem List Decreased strength;Decreased mobility;Decreased safety awareness;Decreased range of motion;Decreased knowledge of precautions;Decreased activity tolerance;Cardiopulmonary status limiting activity;Decreased balance;Pain;Impaired sensation;Decreased knowledge of use of DME;Decreased cognition       PT Treatment Interventions DME instruction;Functional mobility training;Balance training;Patient/family education;Wheelchair mobility training;Gait training;Therapeutic  activities;Therapeutic exercise    PT Goals (Current goals can be found in the Care Plan section)  Acute Rehab PT Goals Patient Stated Goal: to get home for thanksgiving PT Goal Formulation: With patient/family Time For Goal Achievement: 03/09/18 Potential to Achieve Goals: Good    Frequency Min 3X/week   Barriers to discharge Inaccessible home environment;Decreased caregiver support stairs; spouse unable to assist physically.    Co-evaluation               AM-PAC PT "6 Clicks" Mobility  Outcome Measure Help needed turning from your back to your side while in a flat bed without using bedrails?: A Little Help needed moving from lying on your back to sitting on the side of a flat bed without using bedrails?: A Little Help needed moving to and from a bed to a chair (including a wheelchair)?: A Lot Help needed standing up from a chair using your arms (e.g., wheelchair or bedside chair)?: A Lot Help needed to walk in hospital room?: Total Help needed climbing 3-5 steps with a railing? : Total 6 Click Score: 12    End of Session Equipment Utilized During Treatment: Gait belt Activity Tolerance: Patient tolerated treatment well Patient left: in chair;with call bell/phone within reach;with family/visitor present Nurse Communication: Mobility status PT Visit Diagnosis: Pain;Muscle weakness (generalized) (M62.81);Difficulty in walking, not elsewhere classified (R26.2);History of falling (Z91.81) Pain - Right/Left: Right Pain - part of body: Ankle and joints of foot    Time: 5852-7782 PT Time Calculation (min) (ACUTE ONLY): 30 min   Charges:   PT Evaluation $PT Eval Moderate Complexity: 1 Mod PT Treatments $Therapeutic Activity: 8-22 mins  Wray Kearns, PT, DPT Acute Rehabilitation Services Pager [phone] Office [phone]      [place] 02/23/2018, 10:19 AM

## 2018-02-24 ENCOUNTER — Other Ambulatory Visit: Payer: Self-pay | Admitting: *Deleted

## 2018-02-24 ENCOUNTER — Emergency Department (HOSPITAL_COMMUNITY): Payer: Medicare Other

## 2018-02-24 ENCOUNTER — Encounter (HOSPITAL_COMMUNITY): Payer: Self-pay | Admitting: Orthopedic Surgery

## 2018-02-24 ENCOUNTER — Inpatient Hospital Stay (HOSPITAL_COMMUNITY)
Admission: EM | Admit: 2018-02-24 | Discharge: 2018-03-02 | DRG: 871 | Disposition: A | Discharge status: Skilled Nursing Facility | Payer: Medicare Other | Attending: Internal Medicine | Admitting: Internal Medicine

## 2018-02-24 ENCOUNTER — Other Ambulatory Visit: Payer: Self-pay

## 2018-02-24 DIAGNOSIS — E860 Dehydration: Secondary | ICD-10-CM | POA: Diagnosis present

## 2018-02-24 DIAGNOSIS — N179 Acute kidney failure, unspecified: Secondary | ICD-10-CM | POA: Diagnosis not present

## 2018-02-24 DIAGNOSIS — I2510 Atherosclerotic heart disease of native coronary artery without angina pectoris: Secondary | ICD-10-CM

## 2018-02-24 DIAGNOSIS — E118 Type 2 diabetes mellitus with unspecified complications: Secondary | ICD-10-CM

## 2018-02-24 DIAGNOSIS — E1165 Type 2 diabetes mellitus with hyperglycemia: Secondary | ICD-10-CM

## 2018-02-24 DIAGNOSIS — I10 Essential (primary) hypertension: Secondary | ICD-10-CM | POA: Diagnosis present

## 2018-02-24 DIAGNOSIS — Z7902 Long term (current) use of antithrombotics/antiplatelets: Secondary | ICD-10-CM

## 2018-02-24 DIAGNOSIS — A419 Sepsis, unspecified organism: Secondary | ICD-10-CM | POA: Diagnosis not present

## 2018-02-24 DIAGNOSIS — R0902 Hypoxemia: Secondary | ICD-10-CM | POA: Diagnosis not present

## 2018-02-24 DIAGNOSIS — F0390 Unspecified dementia without behavioral disturbance: Secondary | ICD-10-CM | POA: Diagnosis present

## 2018-02-24 DIAGNOSIS — N184 Chronic kidney disease, stage 4 (severe): Secondary | ICD-10-CM | POA: Diagnosis not present

## 2018-02-24 DIAGNOSIS — R7989 Other specified abnormal findings of blood chemistry: Secondary | ICD-10-CM | POA: Diagnosis not present

## 2018-02-24 DIAGNOSIS — F05 Delirium due to known physiological condition: Secondary | ICD-10-CM | POA: Diagnosis not present

## 2018-02-24 DIAGNOSIS — R000 Tachycardia, unspecified: Secondary | ICD-10-CM | POA: Diagnosis not present

## 2018-02-24 DIAGNOSIS — E1142 Type 2 diabetes mellitus with diabetic polyneuropathy: Secondary | ICD-10-CM | POA: Diagnosis present

## 2018-02-24 DIAGNOSIS — E1122 Type 2 diabetes mellitus with diabetic chronic kidney disease: Secondary | ICD-10-CM | POA: Diagnosis present

## 2018-02-24 DIAGNOSIS — Z853 Personal history of malignant neoplasm of breast: Secondary | ICD-10-CM

## 2018-02-24 DIAGNOSIS — T502X5A Adverse effect of carbonic-anhydrase inhibitors, benzothiadiazides and other diuretics, initial encounter: Secondary | ICD-10-CM | POA: Diagnosis present

## 2018-02-24 DIAGNOSIS — K219 Gastro-esophageal reflux disease without esophagitis: Secondary | ICD-10-CM | POA: Diagnosis present

## 2018-02-24 DIAGNOSIS — Z8673 Personal history of transient ischemic attack (TIA), and cerebral infarction without residual deficits: Secondary | ICD-10-CM

## 2018-02-24 DIAGNOSIS — C50211 Malignant neoplasm of upper-inner quadrant of right female breast: Secondary | ICD-10-CM

## 2018-02-24 DIAGNOSIS — Z85820 Personal history of malignant melanoma of skin: Secondary | ICD-10-CM

## 2018-02-24 DIAGNOSIS — Z79899 Other long term (current) drug therapy: Secondary | ICD-10-CM

## 2018-02-24 DIAGNOSIS — IMO0002 Reserved for concepts with insufficient information to code with codable children: Secondary | ICD-10-CM | POA: Diagnosis present

## 2018-02-24 DIAGNOSIS — C50412 Malignant neoplasm of upper-outer quadrant of left female breast: Secondary | ICD-10-CM

## 2018-02-24 DIAGNOSIS — I5033 Acute on chronic diastolic (congestive) heart failure: Secondary | ICD-10-CM | POA: Diagnosis present

## 2018-02-24 DIAGNOSIS — Z9861 Coronary angioplasty status: Secondary | ICD-10-CM

## 2018-02-24 DIAGNOSIS — Z170 Estrogen receptor positive status [ER+]: Secondary | ICD-10-CM

## 2018-02-24 DIAGNOSIS — Z96652 Presence of left artificial knee joint: Secondary | ICD-10-CM | POA: Diagnosis present

## 2018-02-24 DIAGNOSIS — E785 Hyperlipidemia, unspecified: Secondary | ICD-10-CM | POA: Diagnosis not present

## 2018-02-24 DIAGNOSIS — E876 Hypokalemia: Secondary | ICD-10-CM | POA: Diagnosis not present

## 2018-02-24 DIAGNOSIS — Z794 Long term (current) use of insulin: Secondary | ICD-10-CM

## 2018-02-24 DIAGNOSIS — I130 Hypertensive heart and chronic kidney disease with heart failure and stage 1 through stage 4 chronic kidney disease, or unspecified chronic kidney disease: Secondary | ICD-10-CM | POA: Diagnosis present

## 2018-02-24 DIAGNOSIS — N183 Chronic kidney disease, stage 3 (moderate): Secondary | ICD-10-CM

## 2018-02-24 DIAGNOSIS — Z90710 Acquired absence of both cervix and uterus: Secondary | ICD-10-CM

## 2018-02-24 DIAGNOSIS — M109 Gout, unspecified: Secondary | ICD-10-CM | POA: Diagnosis present

## 2018-02-24 DIAGNOSIS — Z955 Presence of coronary angioplasty implant and graft: Secondary | ICD-10-CM

## 2018-02-24 DIAGNOSIS — N189 Chronic kidney disease, unspecified: Secondary | ICD-10-CM

## 2018-02-24 DIAGNOSIS — Z9221 Personal history of antineoplastic chemotherapy: Secondary | ICD-10-CM

## 2018-02-24 DIAGNOSIS — Z981 Arthrodesis status: Secondary | ICD-10-CM

## 2018-02-24 DIAGNOSIS — I959 Hypotension, unspecified: Secondary | ICD-10-CM

## 2018-02-24 DIAGNOSIS — R509 Fever, unspecified: Secondary | ICD-10-CM

## 2018-02-24 DIAGNOSIS — Z79811 Long term (current) use of aromatase inhibitors: Secondary | ICD-10-CM

## 2018-02-24 DIAGNOSIS — Z833 Family history of diabetes mellitus: Secondary | ICD-10-CM

## 2018-02-24 DIAGNOSIS — I5032 Chronic diastolic (congestive) heart failure: Secondary | ICD-10-CM | POA: Diagnosis not present

## 2018-02-24 DIAGNOSIS — R0602 Shortness of breath: Secondary | ICD-10-CM | POA: Diagnosis not present

## 2018-02-24 DIAGNOSIS — R339 Retention of urine, unspecified: Secondary | ICD-10-CM | POA: Diagnosis present

## 2018-02-24 HISTORY — DX: Chronic kidney disease, stage 4 (severe): N18.4

## 2018-02-24 LAB — CBC WITH DIFFERENTIAL/PLATELET
Abs Immature Granulocytes: 0.05 10*3/uL (ref 0.00–0.07)
Basophils Absolute: 0.1 10*3/uL (ref 0.0–0.1)
Basophils Relative: 0 %
Eosinophils Absolute: 0.3 10*3/uL (ref 0.0–0.5)
Eosinophils Relative: 3 %
HCT: 36 % (ref 36.0–46.0)
Hemoglobin: 11.5 g/dL — ABNORMAL LOW (ref 12.0–15.0)
Immature Granulocytes: 0 %
Lymphocytes Relative: 12 %
Lymphs Abs: 1.6 10*3/uL (ref 0.7–4.0)
MCH: 30.1 pg (ref 26.0–34.0)
MCHC: 31.9 g/dL (ref 30.0–36.0)
MCV: 94.2 fL (ref 80.0–100.0)
Monocytes Absolute: 0.9 10*3/uL (ref 0.1–1.0)
Monocytes Relative: 7 %
Neutro Abs: 10.5 10*3/uL — ABNORMAL HIGH (ref 1.7–7.7)
Neutrophils Relative %: 78 %
Platelets: 182 10*3/uL (ref 150–400)
RBC: 3.82 MIL/uL — ABNORMAL LOW (ref 3.87–5.11)
RDW: 17.4 % — ABNORMAL HIGH (ref 11.5–15.5)
WBC: 13.4 10*3/uL — ABNORMAL HIGH (ref 4.0–10.5)
nRBC: 0 % (ref 0.0–0.2)

## 2018-02-24 LAB — COMPREHENSIVE METABOLIC PANEL
ALT: 10 U/L (ref 0–44)
AST: 41 U/L (ref 15–41)
Albumin: 3.1 g/dL — ABNORMAL LOW (ref 3.5–5.0)
Alkaline Phosphatase: 74 U/L (ref 38–126)
Anion gap: 13 (ref 5–15)
BUN: 51 mg/dL — ABNORMAL HIGH (ref 8–23)
CO2: 33 mmol/L — ABNORMAL HIGH (ref 22–32)
Calcium: 9.3 mg/dL (ref 8.9–10.3)
Chloride: 91 mmol/L — ABNORMAL LOW (ref 98–111)
Creatinine, Ser: 2.92 mg/dL — ABNORMAL HIGH (ref 0.44–1.00)
GFR calc Af Amer: 17 mL/min — ABNORMAL LOW (ref >60)
GFR calc non Af Amer: 15 mL/min — ABNORMAL LOW (ref >60)
Glucose, Bld: 136 mg/dL — ABNORMAL HIGH (ref 70–99)
Potassium: 2.2 mmol/L — CL (ref 3.5–5.1)
Sodium: 137 mmol/L (ref 135–145)
Total Bilirubin: 0.6 mg/dL (ref 0.3–1.2)
Total Protein: 6.6 g/dL (ref 6.5–8.1)

## 2018-02-24 LAB — APTT: aPTT: 30 seconds (ref 24–36)

## 2018-02-24 LAB — PROTIME-INR
INR: 1.28
Prothrombin Time: 15.9 seconds — ABNORMAL HIGH (ref 11.4–15.2)

## 2018-02-24 LAB — GLUCOSE, CAPILLARY: Glucose-Capillary: 121 mg/dL — ABNORMAL HIGH (ref 70–99)

## 2018-02-24 LAB — URINALYSIS, ROUTINE W REFLEX MICROSCOPIC
Bacteria, UA: NONE SEEN
Bilirubin Urine: NEGATIVE
Glucose, UA: NEGATIVE mg/dL
Ketones, ur: NEGATIVE mg/dL
Nitrite: NEGATIVE
Protein, ur: NEGATIVE mg/dL
Specific Gravity, Urine: 1.006 (ref 1.005–1.030)
pH: 6 (ref 5.0–8.0)

## 2018-02-24 LAB — PROCALCITONIN: Procalcitonin: 1.33 ng/mL

## 2018-02-24 LAB — TROPONIN I: Troponin I: 0.05 ng/mL (ref <0.03)

## 2018-02-24 LAB — LACTIC ACID, PLASMA: Lactic Acid, Venous: 1.1 mmol/L (ref 0.5–1.9)

## 2018-02-24 LAB — I-STAT CG4 LACTIC ACID, ED: Lactic Acid, Venous: 1.21 mmol/L (ref 0.5–1.9)

## 2018-02-24 LAB — MAGNESIUM: Magnesium: 1.6 mg/dL — ABNORMAL LOW (ref 1.7–2.4)

## 2018-02-24 MED ORDER — FENTANYL CITRATE (PF) 100 MCG/2ML IJ SOLN: 50.0000 ug | INTRAMUSCULAR | Status: DC | PRN

## 2018-02-24 MED ORDER — ONDANSETRON HCL 4 MG/2ML IJ SOLN: 4.0000 mg | Freq: Four times a day (QID) | INTRAMUSCULAR | Status: DC | PRN

## 2018-02-24 MED ORDER — INSULIN NPH (HUMAN) (ISOPHANE) 100 UNIT/ML ~~LOC~~ SUSP
25.0000 [IU] | Freq: Every day | SUBCUTANEOUS | Status: DC
  Administered 2018-02-24 – 2018-02-28 (×5): 25 [IU] via SUBCUTANEOUS
  Filled 2018-02-24 (×2): qty 10

## 2018-02-24 MED ORDER — SODIUM CHLORIDE 0.9 % IV SOLN
2.0000 g | Freq: Once | INTRAVENOUS | Status: AC
  Administered 2018-02-24: 2 g via INTRAVENOUS
  Filled 2018-02-24: qty 2

## 2018-02-24 MED ORDER — EXEMESTANE 25 MG PO TABS
25.0000 mg | ORAL_TABLET | Freq: Every day | ORAL | Status: DC
  Administered 2018-02-25 – 2018-03-02 (×6): 25 mg via ORAL
  Filled 2018-02-24 (×6): qty 1

## 2018-02-24 MED ORDER — SODIUM CHLORIDE 0.9 % IV SOLN
INTRAVENOUS | Status: AC
  Administered 2018-02-24: 75 mL via INTRAVENOUS

## 2018-02-24 MED ORDER — VANCOMYCIN HCL IN DEXTROSE 1-5 GM/200ML-% IV SOLN
1000.0000 mg | INTRAVENOUS | Status: DC
  Administered 2018-02-26: 1000 mg via INTRAVENOUS
  Filled 2018-02-24: qty 200

## 2018-02-24 MED ORDER — ENOXAPARIN SODIUM 30 MG/0.3ML ~~LOC~~ SOLN
30.0000 mg | SUBCUTANEOUS | Status: DC
  Administered 2018-02-24: 30 mg via SUBCUTANEOUS
  Filled 2018-02-24: qty 0.3

## 2018-02-24 MED ORDER — SODIUM CHLORIDE 0.9 % IV SOLN
1.0000 g | INTRAVENOUS | Status: DC
  Administered 2018-02-25 – 2018-02-28 (×4): 1 g via INTRAVENOUS
  Filled 2018-02-24 (×4): qty 1

## 2018-02-24 MED ORDER — POTASSIUM CHLORIDE 10 MEQ/100ML IV SOLN
10.0000 meq | INTRAVENOUS | Status: AC
  Administered 2018-02-24: 10 meq via INTRAVENOUS
  Filled 2018-02-24: qty 100

## 2018-02-24 MED ORDER — PANTOPRAZOLE SODIUM 40 MG PO TBEC
40.0000 mg | DELAYED_RELEASE_TABLET | Freq: Every day | ORAL | Status: DC
  Administered 2018-02-25 – 2018-03-02 (×6): 40 mg via ORAL
  Filled 2018-02-24 (×6): qty 1

## 2018-02-24 MED ORDER — PRAMIPEXOLE DIHYDROCHLORIDE 0.25 MG PO TABS
0.2500 mg | ORAL_TABLET | Freq: Every day | ORAL | Status: DC
  Administered 2018-02-25 – 2018-03-01 (×5): 0.25 mg via ORAL
  Filled 2018-02-24 (×6): qty 1

## 2018-02-24 MED ORDER — PRAMIPEXOLE DIHYDROCHLORIDE 0.25 MG PO TABS
0.2500 mg | ORAL_TABLET | Freq: Every day | ORAL | Status: DC
  Filled 2018-02-24: qty 1

## 2018-02-24 MED ORDER — PRAMIPEXOLE DIHYDROCHLORIDE 0.25 MG PO TABS
0.5000 mg | ORAL_TABLET | Freq: Every day | ORAL | Status: DC
  Filled 2018-02-24: qty 2

## 2018-02-24 MED ORDER — ACETAMINOPHEN 325 MG PO TABS
650.0000 mg | ORAL_TABLET | Freq: Four times a day (QID) | ORAL | Status: DC | PRN
  Administered 2018-02-24 – 2018-02-27 (×4): 650 mg via ORAL
  Filled 2018-02-24 (×5): qty 2

## 2018-02-24 MED ORDER — OXYCODONE-ACETAMINOPHEN 5-325 MG PO TABS: 1.0000 | ORAL_TABLET | ORAL | Status: DC | PRN

## 2018-02-24 MED ORDER — MIRTAZAPINE 7.5 MG PO TABS
22.5000 mg | ORAL_TABLET | Freq: Every day | ORAL | Status: DC
  Administered 2018-02-24: 22.5 mg via ORAL
  Filled 2018-02-24: qty 3

## 2018-02-24 MED ORDER — SENNA 8.6 MG PO TABS
1.0000 | ORAL_TABLET | Freq: Two times a day (BID) | ORAL | Status: DC
  Administered 2018-02-24 – 2018-03-01 (×10): 8.6 mg via ORAL
  Filled 2018-02-24 (×12): qty 1

## 2018-02-24 MED ORDER — ROSUVASTATIN CALCIUM 20 MG PO TABS
20.0000 mg | ORAL_TABLET | Freq: Every day | ORAL | Status: DC
  Administered 2018-02-25 – 2018-03-02 (×6): 20 mg via ORAL
  Filled 2018-02-24 (×6): qty 1

## 2018-02-24 MED ORDER — SODIUM CHLORIDE 0.9 % IV BOLUS
500.0000 mL | Freq: Once | INTRAVENOUS | Status: AC
  Administered 2018-02-24: 500 mL via INTRAVENOUS

## 2018-02-24 MED ORDER — GABAPENTIN 300 MG PO CAPS
300.0000 mg | ORAL_CAPSULE | Freq: Three times a day (TID) | ORAL | Status: DC
  Administered 2018-02-24 – 2018-03-02 (×18): 300 mg via ORAL
  Filled 2018-02-24 (×18): qty 1

## 2018-02-24 MED ORDER — ACETAMINOPHEN 650 MG RE SUPP: 650.0000 mg | Freq: Four times a day (QID) | RECTAL | Status: DC | PRN

## 2018-02-24 MED ORDER — PRAMIPEXOLE DIHYDROCHLORIDE 0.25 MG PO TABS
0.5000 mg | ORAL_TABLET | Freq: Every day | ORAL | Status: DC
  Administered 2018-02-25 – 2018-03-01 (×5): 0.5 mg via ORAL
  Filled 2018-02-24 (×6): qty 2

## 2018-02-24 MED ORDER — VANCOMYCIN HCL IN DEXTROSE 1-5 GM/200ML-% IV SOLN
1000.0000 mg | Freq: Once | INTRAVENOUS | Status: AC
  Administered 2018-02-24: 1000 mg via INTRAVENOUS
  Filled 2018-02-24: qty 200

## 2018-02-24 MED ORDER — POTASSIUM CHLORIDE CRYS ER 20 MEQ PO TBCR
40.0000 meq | EXTENDED_RELEASE_TABLET | Freq: Once | ORAL | Status: AC
  Administered 2018-02-24: 40 meq via ORAL
  Filled 2018-02-24: qty 2

## 2018-02-24 MED ORDER — ONDANSETRON HCL 4 MG PO TABS: 4.0000 mg | ORAL_TABLET | Freq: Four times a day (QID) | ORAL | Status: DC | PRN

## 2018-02-24 MED ORDER — INSULIN ASPART 100 UNIT/ML ~~LOC~~ SOLN
0.0000 [IU] | SUBCUTANEOUS | Status: DC
  Administered 2018-02-25: 1 [IU] via SUBCUTANEOUS

## 2018-02-24 MED ORDER — METRONIDAZOLE IN NACL 5-0.79 MG/ML-% IV SOLN
500.0000 mg | Freq: Three times a day (TID) | INTRAVENOUS | Status: AC
  Administered 2018-02-24 – 2018-02-25 (×4): 500 mg via INTRAVENOUS
  Filled 2018-02-24 (×4): qty 100

## 2018-02-24 MED ORDER — ALLOPURINOL 100 MG PO TABS
100.0000 mg | ORAL_TABLET | Freq: Two times a day (BID) | ORAL | Status: DC
  Administered 2018-02-24 – 2018-03-02 (×12): 100 mg via ORAL
  Filled 2018-02-24 (×12): qty 1

## 2018-02-24 MED ORDER — INSULIN NPH (HUMAN) (ISOPHANE) 100 UNIT/ML ~~LOC~~ SUSP
20.0000 [IU] | Freq: Every day | SUBCUTANEOUS | Status: DC
  Administered 2018-02-25 – 2018-03-02 (×6): 20 [IU] via SUBCUTANEOUS
  Filled 2018-02-24: qty 10

## 2018-02-24 NOTE — Patient Outreach (Signed)
[facility]) Care Management  02/24/2018  SAKIA SCHRIMPF 08/01/1940 [id number] This encounter was created in error - please disregard.

## 2018-02-24 NOTE — Patient Outreach (Addendum)
[facility]) Care Management  02/24/2018  [name] 07-15-1940 [id number]   Telephone assessment   Patient with hospital admission 5/6-5/8, Dx Acute on Chronic heart failure PMH: includes but not limited to CKD, Diabetes, HTN, Heart Failure, breast cancer, gout elevated uric acid level  Right ankle pain, chronic idiopathic gout, 11/27 Right subtalar and talonavicular fusion .   Transition of care by PCP office of Dr.Fusco   Scheduled outreach call to patient, he daughter Levada Dy answers the phone, HIPAA confirmed.  Daughter states patient just left by EMS to [facility] , she reports that they have been having difficulty keeping patient oxygen saturations up and it was recommended that she be evaluated at hospital .    Plan  Will will follow progress and disposition plans for next follow up .    [name], RN, [place] Management Coordinator  [phone]- Mobile [phone]- Toll Free Main Office

## 2018-02-24 NOTE — Progress Notes (Signed)
Pharmacy Antibiotic Note  [name] is a 77 y.o. female admitted on 02/24/2018 with cellulitis and possible sepsis. WBC elevated 13, Cr elevated 2.9 from BL 2.2 Crcl 20ml/min, BP soft, febrile Tm 100.8. Pharmacy has been consulted for vancomycin and cefepime dosing.  Plan: Cefepiem 2gm x1 iv then 1gm iv q24h Vancomycin 1gm iv q48h  Height: 5\' 6"  (167.6 cm) Weight: 191 lb 5.8 oz (86.8 kg) IBW/kg (Calculated) : 59.3  Temp (24hrs), Avg:99.4 F (37.4 C), Min:98.6 F (37 C), Max:100.8 F (38.2 C)  Recent Labs  Lab 02/24/18 1457 02/24/18 1510  WBC 13.4*  --   CREATININE 2.92*  --   LATICACIDVEN  --  1.21    Estimated Creatinine Clearance: 17.3 mL/min (A) (by C-G formula based on SCr of 2.92 mg/dL (H)).    Allergies  Allergen Reactions  . Ultram [Tramadol] Other (See Comments)    Pt has seizure activity with this medication  . Lipitor [Atorvastatin] Other (See Comments)    Bone and muscle pain  . Lyrica [Pregabalin] Other (See Comments)    Weight gain, extremity swelling  . Reglan [Metoclopramide] Other (See Comments)    Tardive dyskinesia; paradoxical reaction not relieved by benadryl  . Nsaids Swelling    SWELLING REACTION UNSPECIFIED     Antimicrobials this admission:   Dose adjustments this admission:  Microbiology results:   Bonnita Nasuti Pharm.D. CPP, BCPS Clinical Pharmacist [phone] 02/24/2018 9:19 PM

## 2018-02-24 NOTE — Progress Notes (Signed)
CRITICAL VALUE ALERT  Critical Value: trop 0.05   Date & Time Notied: 02/24/18 2200  Provider Notified: X. Blount NP paged   Orders Received/Actions taken:

## 2018-02-24 NOTE — ED Provider Notes (Addendum)
[facility] EMERGENCY DEPARTMENT Provider Note   CSN: [id number] Arrival date & time: 02/24/18  1431     History   Chief Complaint No chief complaint on file.   HPI [name] is a 77 y.o. female.  Patient is a 77 year old female who had surgery recently on her foot.  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  She was discharged from the hospital yesterday.  Her family states that she has had low oxygen levels since that time.  Apparently they have a pulse oximeter at home and have noted that her oxygen levels have been in the upper 80s.  She does not report any shortness of breath.  No cough or chest congestion.  No chest pain.  She has been having fevers up to 103.  She denies any increased pain in her foot.  She does have a wound VAC in place.  She is not on antibiotics.  No urinary symptoms.  No abdominal pain.     Past Medical History:  Diagnosis Date  . Anemia    have received iron infusions  . Anxiety   . Arthritis     osteoarthritis  . Breast cancer ([place])   . Breast cancer in female [facility])    Bilateral  . CAD S/P percutaneous coronary angioplasty 2007; March 2011   [facility] [facility] 3.0 mm 20 mm postdilated 3.6 mm in early mid LAD; status post ISR Cutting Balloon PTCA and March 11 along with PCI of distal mid lesion with a 3.0 mm 12 mm MultiLink vision BMS; the proximal stent causes jailing of SP1 and SP2 with ostial 70-80% lesions  . Cancer (HCC)    Melanoma, Squamous cell Carcinoma  . CHF (congestive heart failure) ([place])   . Chronic kidney disease    stage 2   . Dementia ([place])    mild  . Diabetes mellitus type 2 with neurological manifestations ([place])   . Dyslipidemia, goal LDL below 70   . Full dentures   . Genetic testing 12/03/2016   Germline genetic testing was performed through [employer]'s Common Hereditary Cancers Panel + [facility]'s Melanoma Panel. This custom panel includes analysis of the following 51 genes: APC, ATM, AXIN2, BAP1,  BARD1, BMPR1A, BRCA1, BRCA2, BRIP1, CDH1, CDK4, CDKN2A, CHEK2, CTNNA1, DICER1, EPCAM, GREM1, HOXB13, KIT, MEN1, MITF, MLH1, MSH2, MSH3, MSH6, MUTYH, NBN, NF1, NTHL1, PALB2, PDGFRA, PMS2, POLD1, POL  . GERD (gastroesophageal reflux disease)   . Headache(784.0)   . History of hematuria    Followed by Dr. Gaynelle Arabian  . Hypertension, essential, benign   . Nausea & vomiting 10/2015  . Peripheral neuropathy   . Stroke ([place])   . TIA (transient ischemic attack)    multiple in the past  . Wears glasses     Patient Active Problem List   Diagnosis Date Noted  . Posterior tibial tendinitis of right leg 02/22/2018  . Posterior tibial tendinitis, right leg   . Right atrial mass   . Chronic diastolic heart failure ([place]) 08/01/2017  . Genetic testing 12/03/2016  . 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 Malignant neoplasm of upper-outer quadrant of left breast in female, estrogen receptor positive ([place]) 11/19/2016  . Chronic idiopathic gout involving toe of left foot without tophus 11/15/2016  . Malignant 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, estrogen receptor positive ([place]) 11/09/2016  . Tendon tear, ankle, left, sequela   . Traumatic rupture of left anterior tibial tendon 04/20/2016  . Acute on chronic kidney failure ([place])   .  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 ([place])   . Headache, migraine   . Unexplained weight loss 11/17/2015  . CKD (chronic kidney disease) stage 3, GFR 30-59 ml/min (HCC) 11/17/2015  . Restless legs syndrome 07/24/2015  . Mild cognitive impairment 01/08/2015  . Abnormality of gait 01/08/2015  . Dizziness 12/19/2014  . Preoperative cardiovascular examination 12/11/2013  . GERD (gastroesophageal reflux disease) 10/06/2013  . Syncope 10/05/2013  . Dementia ([place]) 12/06/2012  . Anxiety 10/28/2012    Class: Acute  . Bilateral lower extremity edema 10/28/2012  . CAD S/P percutaneous coronary angioplasty   . Essential hypertension   .  Dyslipidemia, goal LDL below 70     Past Surgical History:  Procedure Laterality Date  . ABDOMINAL HYSTERECTOMY    . ANKLE FUSION Right 02/22/2018   Procedure: RIGHT SUBTALAR AND TALONAVICULAR FUSION;  Surgeon: Newt Minion, MD;  Location: [facility];  Service: Orthopedics;  Laterality: Right;  . APPENDECTOMY    . BLADDER SUSPENSION    . BREAST LUMPECTOMY Bilateral 2018  . BREAST LUMPECTOMY WITH RADIOACTIVE SEED AND SENTINEL LYMPH NODE BIOPSY Bilateral 12/14/2016   Procedure: BILATERAL BREAST LUMPECTOMY WITH BILATERAL  RADIOACTIVE SEED AND BILATERAL AXILLARY  SENTINEL LYMPH NODE BIOPSY ERAS PATHWAY;  Surgeon: Alphonsa Overall, MD;  Location: [facility];  Service: General;  Laterality: Bilateral;  PECTORAL BLOCK  . CARDIAC CATHETERIZATION  02/24/2006   85% stenosis in the proximal portion of LAD-arrangements made for PCI on 02/25/2006  . CARDIAC CATHETERIZATION  02/25/2006   80% LAD lesion stented with a 3x34m Liberte stent resulting in reduction of 80% lesion to 0% residual  . CARDIAC CATHETERIZATION  04/26/2006   Medical management  . CARDIAC CATHETERIZATION  06/24/2009   60-70% re-stenosis in the proximal LAD. A 3.25x15 cutting balloon, 3 inflations - 14atm-38sec, 13atm-39sec, and 12atm-40sec reduced to less than 10%. 60% stenosis of the mid/distal LAD stented with a 3x122mMultilink stent.  . Marland KitchenARDIAC CATHETERIZATION  07/09/2009   Medical management  . CAROTID DOPPLER  06/24/2009   40-59% R ICA stenosis. No significant ICA stenosis noted  . CHOLECYSTECTOMY    . DILATION AND CURETTAGE OF UTERUS    . JOINT REPLACEMENT Left   . LESION REMOVAL Right 03/11/2015   Procedure:  EXCISION OF RIGHT PRE TIBIAL LESION;  Surgeon: JaJudeth HornMD;  [facility] Service: General;  Laterality: Right;  . MASS EXCISION Left 06/10/2014   Procedure: EXCISION LEFT LOWER LEG LESION;  Surgeon: JaDoreen SalvageMD;  [facility] Service: General;  Laterality: Left;  . Marland KitchenASS  EXCISION Left 09/05/2015   Procedure: EXCISION OF LEFT FOREARM  MASS;  Surgeon: JaJudeth HornMD;  [place] [facility] Service: General;  Laterality: Left;  . NM MYOVIEW LTD  11/16/2011   5 beats a PVC during recovery.  No skin or infarction.  Reached 5 METs, EKG negative for ischemia   . NM MYOVIEW LTD  10/2011   No ischemia or infarction.  Normal EF.  LOW RISK. (Short bursts of 5 beats NSVT during recovery otherwise normal)  . SHOULDER ARTHROSCOPY  10/15  . SHOULDER ARTHROSCOPY  1995   left  . TEE WITHOUT CARDIOVERSION N/A 08/03/2017   Procedure: TRANSESOPHAGEAL ECHOCARDIOGRAM (TEE);  Surgeon: RaSkeet LatchMD;  [facility] Service: Cardiovascular;; EF 60-65% with no wall motion normalities.  No atrial thrombus noted.  Lightly findings consistent with a lipomatous hypertrophy of the atrial septum.   . TENDON  REPAIR Left 05/12/2016   Procedure: Left Anterior Tibial Tendon Reconstruction;  Surgeon: Newt Minion, MD;  Location: [facility];  Service: Orthopedics;  Laterality: Left;  . TOTAL KNEE ARTHROPLASTY  2005   left  . TRANSESOPHAGEAL ECHOCARDIOGRAM  08/03/2016   : EF 60-65% no wall motion normalities.  No atrial thrombus.  Lipomatous hypertrophy of the atrial septum.  No mass noted.  . TRANSTHORACIC ECHOCARDIOGRAM  10/2015   EF 55-60 %. Mild LVH. Mild pulmonary hypertension. Normal valves.  . TRANSTHORACIC ECHOCARDIOGRAM  07/2017   Vigorous LV function.  EF 65-70%.  Mild diastolic dysfunction with no RWMA. GR 1 DD.  Mild aortic sclerosis/no stenosis.  Questionable right atrial mass discounted with TEE)   . WRIST ARTHROPLASTY  2010   cancer lt wrist     OB History   None      Home Medications    Prior to Admission medications   Medication Sig Start Date End Date Taking? Authorizing Provider  allopurinol (ZYLOPRIM) 100 MG tablet Take 1 tablet (100 mg total) by mouth 2 (two) times daily. 01/03/18  Yes Newt Minion, MD  bumetanide (BUMEX) 2 MG tablet Take  2-4 mg by mouth See admin instructions. Take 2 tablets (4 mg) by mouth in the morning, take 1 tablet (2 mg) at 2pm, & take 1 tablet (2 mg) by mouth with supper.   Yes [provider]  Calcium Carb-Cholecalciferol (CALCIUM 600+D3) 600-800 MG-UNIT TABS Take 1 tablet by mouth daily.   Yes [provider]  clopidogrel (PLAVIX) 75 MG tablet TAKE 1 TABLET DAILY Patient taking differently: Take 75 mg by mouth daily.  01/18/18  Yes Leonie Man, MD  COD LIVER OIL PO Take 415 mg by mouth daily.   Yes [provider]  colchicine 0.6 MG tablet Take 1 tablet (0.6 mg total) by mouth daily. PRN gout flare Patient taking differently: Take 0.6 mg by mouth daily as needed (for foot pain/gout flare up x 3 days then discontinue).  01/19/18  Yes Newt Minion, MD  Cranberry 500 MG TABS Take 500 mg by mouth every evening.   Yes [provider]  diazepam (VALIUM) 5 MG tablet Take 5 mg by mouth 2 (two) times daily as needed for anxiety.  07/20/17  Yes [provider]  exemestane (AROMASIN) 25 MG tablet TAKE 1 TABLET DAILY AFTER BREAKFAST Patient taking differently: Take 25 mg by mouth daily.  01/16/18  Yes Truitt Merle, MD  gabapentin (NEURONTIN) 300 MG capsule Take 1 capsule (300 mg total) by mouth 2 (two) times daily. Take 1 during the day if needed and 2 at night Patient taking differently: Take 300 mg by mouth 3 (three) times daily. Morning, lunch, & bedtime. 09/01/17  Yes Dennie Bible, NP  insulin NPH Human (HUMULIN N,NOVOLIN N) 100 UNIT/ML injection Inject 20-25 Units into the skin See admin instructions. Inject 20 units in the morning & inject 25 units after supper.   Yes [provider]  Javier Docker Oil 500 MG CAPS Take 500 mg by mouth daily.   Yes [provider]  Methylfol-Methylcob-Acetylcyst (CEREFOLIN NAC) 6-2-600 MG TABS TAKE 1 CAPLET BY MOUTH ONCE DAILY. Patient taking differently: Take 1 tablet by mouth daily.  07/05/17  Yes Garvin Fila, MD    metolazone (ZAROXOLYN) 2.5 MG tablet Take 30 min before the morning dose of bumex as directed daily. Patient taking differently: Take 2.5 mg by mouth See admin instructions. Take 30 min before the morning dose of  bumex on Mondays and Thursdays twice a day 09/01/17  Yes Leonie Man, MD  metoprolol succinate (TOPROL XL) 50 MG 24 hr tablet TAKE ONE-HALF (1/2) TABLET DAILY Patient taking differently: Take 25 mg by mouth every evening.  09/13/17  Yes Leonie Man, MD  mirtazapine (REMERON) 15 MG tablet Take 22.5 mg by mouth at bedtime.    Yes [provider]  Nitroglycerin (NITROMIST) 400 MCG/SPRAY AERS Place 1 spray onto the tongue every 5 (five) minutes x 3 doses as needed (chest pain.).   Yes [provider]  oxyCODONE-acetaminophen (PERCOCET/ROXICET) 5-325 MG tablet Take 1 tablet by mouth every 4 (four) hours as needed. 02/23/18  Yes Newt Minion, MD  pantoprazole (PROTONIX) 40 MG tablet TAKE 1 TABLET DAILY (CONTACT OFFICE FOR ADDITIONAL REFILLS, FINAL ATTEMPT) Patient taking differently: Take 40 mg by mouth daily before breakfast.  02/02/16  Yes Leonie Man, MD  potassium chloride SA (K-DUR,KLOR-CON) 20 MEQ tablet Take 40 mEq by mouth 2 (two) times daily.    Yes [provider]  pramipexole (MIRAPEX) 0.25 MG tablet TAKE 1 TABLET AT 4:00 P.M. AND 2 TABLETS (0.5 MG) AT BEDTIME Patient taking differently: Take 0.25-0.5 mg by mouth See admin instructions. TAKE 0.25  AT 4:00 P.M. AND   (0.5 MG) AT BEDTIME 10/17/17  Yes Garvin Fila, MD  Resveratrol (RESERVAPAK PO) Take 1 tablet by mouth every evening.    Yes [provider]  rosuvastatin (CRESTOR) 20 MG tablet Take 20 mg by mouth daily with supper.    Yes [provider]  Ubiquinol (QUNOL COQ10/UBIQUINOL/MEGA) 100 MG CAPS Take 100 mg by mouth every evening. CoQ10, Qunol    Yes [provider]    Family History Family History  Problem Relation Age of Onset  . Diabetes Mother   .  Hypertension Mother   . Kidney disease Mother   . Hypertension Father   . Emphysema Father   . Heart attack Father   . Heart disease Father   . Arthritis Sister   . Diabetes Sister   . Hypertension Sister   . Breast cancer Sister 54       treated with mastectomy  . Cervical cancer Sister 78  . Hypertension Brother   . Hyperlipidemia Brother   . Diabetes Brother   . Stroke Brother   . Diabetes Sister   . Hypertension Sister   . Stomach cancer Sister 33  . Hypertension Brother   . ALS Brother        d.62s  . Breast cancer Daughter 70       triple negative treated with lumpectomy, chemo, radiation  . Heart attack Daughter 103       d.45  . COPD Daughter   . Cancer Paternal Aunt        unspecified type  . Cancer Paternal Uncle        unspecified type  . Cancer Paternal Uncle        unspecified type    Social History Social History   Tobacco Use  . Smoking status: Never Smoker  . Smokeless tobacco: Never Used  Substance Use Topics  . Alcohol use: No  . Drug use: No     Allergies   Ultram [tramadol]; Lipitor [atorvastatin]; Lyrica [pregabalin]; Reglan [metoclopramide]; and Nsaids   Review of Systems Review of Systems  Constitutional: Positive for fatigue and fever. Negative for chills and diaphoresis.  HENT: Negative for congestion, rhinorrhea and sneezing.   Eyes: Negative.  Respiratory: Negative for cough, chest tightness and shortness of breath.        Hypoxia  Cardiovascular: Negative for chest pain and leg swelling.  Gastrointestinal: Negative for abdominal pain, blood in stool, diarrhea, nausea and vomiting.  Genitourinary: Negative for difficulty urinating, flank pain, frequency and hematuria.  Musculoskeletal: Negative for arthralgias and back pain.  Skin: Negative for rash.  Neurological: Negative for dizziness, speech difficulty, weakness, numbness and headaches.     Physical Exam Updated Vital Signs BP (!) 143/78   Pulse (!) 103   Temp 98.9  F (37.2 C) (Rectal)   Resp 19   SpO2 96%   Physical Exam  Constitutional: She is oriented to person, place, and time. She appears well-developed and well-nourished.  HENT:  Head: Normocephalic and atraumatic.  Eyes: Pupils are equal, round, and reactive to light.  Neck: Normal range of motion. Neck supple.  Cardiovascular: Normal rate, regular rhythm and normal heart sounds.  Pulmonary/Chest: Effort normal and breath sounds normal. No respiratory distress. She has no wheezes. She has no rales. She exhibits no tenderness.  Abdominal: Soft. Bowel sounds are normal. There is no tenderness. There is no rebound and no guarding.  Musculoskeletal: Normal range of motion. She exhibits edema.  Wound vac on right foot.  Mild swelling to lower leg.  No significant warmth or erythema  Lymphadenopathy:    She has no cervical adenopathy.  Neurological: She is alert and oriented to person, place, and time.  Skin: Skin is warm and dry. No rash noted.  Psychiatric: She has a normal mood and affect.     ED Treatments / Results  Labs (all labs ordered are listed, but only abnormal results are displayed) Labs Reviewed  COMPREHENSIVE METABOLIC PANEL - Abnormal; Notable for the following components:      Result Value   Potassium 2.2 (*)    Chloride 91 (*)    CO2 33 (*)    Glucose, Bld 136 (*)    BUN 51 (*)    Creatinine, Ser 2.92 (*)    Albumin 3.1 (*)    GFR calc non Af Amer 15 (*)    GFR calc Af Amer 17 (*)    All other components within normal limits  CBC WITH DIFFERENTIAL/PLATELET - Abnormal; Notable for the following components:   WBC 13.4 (*)    RBC 3.82 (*)    Hemoglobin 11.5 (*)    RDW 17.4 (*)    Neutro Abs 10.5 (*)    All other components within normal limits  URINALYSIS, ROUTINE W REFLEX MICROSCOPIC  I-STAT CG4 LACTIC ACID, ED    EKG EKG Interpretation  Date/Time:  Friday February 24 2018 18:53:07 EST Ventricular Rate:  107 PR Interval:    QRS Duration: 91 QT  Interval:  367 QTC Calculation: 490 R Axis:   51 Text Interpretation:  Sinus tachycardia Inferior infarct, old SINCE LAST TRACING HEART RATE HAS INCREASED Confirmed by Malvin Johns [phone]) on 02/24/2018 7:01:25 PM   Radiology Dg Chest 2 View  Result Date: 02/24/2018 CLINICAL DATA:  Episode of oxygen desaturation today. EXAM: CHEST - 2 VIEW COMPARISON:  PA and lateral chest 08/01/2017. FINDINGS: The lungs are clear. Bosselation of the right hemidiaphragm is noted. No pneumothorax or pleural effusion. Heart size is normal. Aortic atherosclerosis is seen. No acute or focal bony abnormality. IMPRESSION: No acute disease. Electronically Signed   By: Inge Rise M.D.   On: 02/24/2018 18:00    Procedures Procedures (including critical care time)  Medications Ordered in ED Medications  potassium chloride 10 mEq in 100 mL IVPB (0 mEq Intravenous Stopped 02/24/18 1736)  potassium chloride SA (K-DUR,KLOR-CON) CR tablet 40 mEq (40 mEq Oral Given 02/24/18 1611)     Initial Impression / Assessment and Plan / ED Course  I have reviewed the triage vital signs and the nursing notes.  Pertinent labs & imaging results that were available during my care of the patient were reviewed by me and considered in my medical decision making (see chart for details).     Patient is a 77 year old female who presents with hypoxia with oxygen saturations in the upper 80s.  She does not report any shortness of breath.  She has a little bit of cough.  Her chest x-Sassi is negative without suggestions of pneumonia or pneumothorax.  There is no edema.  She is fairly high risk for PEs.  Were unable to do a CT scan of her chest due to her creatinine.  A VQ scan has been ordered but we are unable to do that tonight.  We will go ahead and get her admitted and do the VQ scan tomorrow.  She also reports a fever at home.  She does not have a fever here.  Her rectal temp is normal.  She did have a temperature of 102  yesterday on discharge from the hospital per the discharge note.  I do not see any suggestions of infection although her urine is still pending.  Her wound does not appear to be infected.  Her chest x-[place] is clear.  Her white count is minimally elevated.  Of note she does have a markedly low potassium.  She was given both oral and IV potassium replacement.  I have spoken to Dr. Roel Cluck who will admit the patient for further treatment.  Final Clinical Impressions(s) / ED Diagnoses   Final diagnoses:  Hypokalemia  Hypoxia  Febrile illness    ED Discharge Orders    None       Malvin Johns, MD 02/24/18 1840    Malvin Johns, MD 02/24/18 1906

## 2018-02-24 NOTE — ED Notes (Signed)
Pt's temp 100.8 oral.  Dr. Roel Cluck made aware

## 2018-02-24 NOTE — ED Triage Notes (Addendum)
Per EMS- pt post op from Thursday had surgery to right foot. Wound vac in place. EMS was called out for O2 sats in 80s noted by PT. EMS reports sats between 90s-96 % on room air. Pt has not eaten today, did take insulin. CBG 174. Pt family concerned she is fighting an infection. HR 103. Temp normal orally. Pt family report she was febrile at home. She took percocet and fever improved. Family reports highest temp at home was 102. She denies any shortness of breath or chest pain.

## 2018-02-24 NOTE — ED Notes (Signed)
Pt to xray at this time.

## 2018-02-25 ENCOUNTER — Inpatient Hospital Stay (HOSPITAL_COMMUNITY): Payer: Medicare Other

## 2018-02-25 ENCOUNTER — Encounter (HOSPITAL_COMMUNITY): Payer: Self-pay | Admitting: *Deleted

## 2018-02-25 ENCOUNTER — Observation Stay (HOSPITAL_COMMUNITY): Payer: Medicare Other

## 2018-02-25 DIAGNOSIS — K219 Gastro-esophageal reflux disease without esophagitis: Secondary | ICD-10-CM | POA: Diagnosis not present

## 2018-02-25 DIAGNOSIS — R609 Edema, unspecified: Secondary | ICD-10-CM

## 2018-02-25 DIAGNOSIS — R0902 Hypoxemia: Secondary | ICD-10-CM | POA: Diagnosis not present

## 2018-02-25 DIAGNOSIS — I130 Hypertensive heart and chronic kidney disease with heart failure and stage 1 through stage 4 chronic kidney disease, or unspecified chronic kidney disease: Secondary | ICD-10-CM | POA: Diagnosis not present

## 2018-02-25 DIAGNOSIS — F05 Delirium due to known physiological condition: Secondary | ICD-10-CM | POA: Diagnosis not present

## 2018-02-25 DIAGNOSIS — Z7902 Long term (current) use of antithrombotics/antiplatelets: Secondary | ICD-10-CM | POA: Diagnosis not present

## 2018-02-25 DIAGNOSIS — Z90710 Acquired absence of both cervix and uterus: Secondary | ICD-10-CM | POA: Diagnosis not present

## 2018-02-25 DIAGNOSIS — R7989 Other specified abnormal findings of blood chemistry: Secondary | ICD-10-CM | POA: Diagnosis not present

## 2018-02-25 DIAGNOSIS — I10 Essential (primary) hypertension: Secondary | ICD-10-CM | POA: Diagnosis not present

## 2018-02-25 DIAGNOSIS — R0602 Shortness of breath: Secondary | ICD-10-CM | POA: Diagnosis not present

## 2018-02-25 DIAGNOSIS — I2510 Atherosclerotic heart disease of native coronary artery without angina pectoris: Secondary | ICD-10-CM | POA: Diagnosis present

## 2018-02-25 DIAGNOSIS — M79671 Pain in right foot: Secondary | ICD-10-CM | POA: Diagnosis not present

## 2018-02-25 DIAGNOSIS — I5030 Unspecified diastolic (congestive) heart failure: Secondary | ICD-10-CM

## 2018-02-25 DIAGNOSIS — N189 Chronic kidney disease, unspecified: Secondary | ICD-10-CM

## 2018-02-25 DIAGNOSIS — R6520 Severe sepsis without septic shock: Secondary | ICD-10-CM

## 2018-02-25 DIAGNOSIS — Z9861 Coronary angioplasty status: Secondary | ICD-10-CM | POA: Diagnosis not present

## 2018-02-25 DIAGNOSIS — N184 Chronic kidney disease, stage 4 (severe): Secondary | ICD-10-CM

## 2018-02-25 DIAGNOSIS — Z8673 Personal history of transient ischemic attack (TIA), and cerebral infarction without residual deficits: Secondary | ICD-10-CM | POA: Diagnosis not present

## 2018-02-25 DIAGNOSIS — Z981 Arthrodesis status: Secondary | ICD-10-CM | POA: Diagnosis not present

## 2018-02-25 DIAGNOSIS — E1165 Type 2 diabetes mellitus with hyperglycemia: Secondary | ICD-10-CM | POA: Diagnosis not present

## 2018-02-25 DIAGNOSIS — Z7401 Bed confinement status: Secondary | ICD-10-CM | POA: Diagnosis not present

## 2018-02-25 DIAGNOSIS — C50211 Malignant neoplasm of upper-inner quadrant of right female breast: Secondary | ICD-10-CM | POA: Diagnosis not present

## 2018-02-25 DIAGNOSIS — R339 Retention of urine, unspecified: Secondary | ICD-10-CM | POA: Diagnosis not present

## 2018-02-25 DIAGNOSIS — E876 Hypokalemia: Secondary | ICD-10-CM | POA: Diagnosis not present

## 2018-02-25 DIAGNOSIS — E1122 Type 2 diabetes mellitus with diabetic chronic kidney disease: Secondary | ICD-10-CM | POA: Diagnosis present

## 2018-02-25 DIAGNOSIS — Z794 Long term (current) use of insulin: Secondary | ICD-10-CM | POA: Diagnosis not present

## 2018-02-25 DIAGNOSIS — E1142 Type 2 diabetes mellitus with diabetic polyneuropathy: Secondary | ICD-10-CM | POA: Diagnosis present

## 2018-02-25 DIAGNOSIS — I5033 Acute on chronic diastolic (congestive) heart failure: Secondary | ICD-10-CM | POA: Diagnosis not present

## 2018-02-25 DIAGNOSIS — Z170 Estrogen receptor positive status [ER+]: Secondary | ICD-10-CM | POA: Diagnosis not present

## 2018-02-25 DIAGNOSIS — M2550 Pain in unspecified joint: Secondary | ICD-10-CM | POA: Diagnosis not present

## 2018-02-25 DIAGNOSIS — E785 Hyperlipidemia, unspecified: Secondary | ICD-10-CM | POA: Diagnosis present

## 2018-02-25 DIAGNOSIS — Z955 Presence of coronary angioplasty implant and graft: Secondary | ICD-10-CM | POA: Diagnosis not present

## 2018-02-25 DIAGNOSIS — E118 Type 2 diabetes mellitus with unspecified complications: Secondary | ICD-10-CM | POA: Diagnosis not present

## 2018-02-25 DIAGNOSIS — Z79899 Other long term (current) drug therapy: Secondary | ICD-10-CM | POA: Diagnosis not present

## 2018-02-25 DIAGNOSIS — Z833 Family history of diabetes mellitus: Secondary | ICD-10-CM | POA: Diagnosis not present

## 2018-02-25 DIAGNOSIS — I5032 Chronic diastolic (congestive) heart failure: Secondary | ICD-10-CM | POA: Diagnosis not present

## 2018-02-25 DIAGNOSIS — N179 Acute kidney failure, unspecified: Secondary | ICD-10-CM | POA: Diagnosis not present

## 2018-02-25 DIAGNOSIS — C50412 Malignant neoplasm of upper-outer quadrant of left female breast: Secondary | ICD-10-CM | POA: Diagnosis not present

## 2018-02-25 DIAGNOSIS — I959 Hypotension, unspecified: Secondary | ICD-10-CM | POA: Diagnosis not present

## 2018-02-25 DIAGNOSIS — M109 Gout, unspecified: Secondary | ICD-10-CM | POA: Diagnosis not present

## 2018-02-25 DIAGNOSIS — Z96652 Presence of left artificial knee joint: Secondary | ICD-10-CM | POA: Diagnosis not present

## 2018-02-25 DIAGNOSIS — A419 Sepsis, unspecified organism: Secondary | ICD-10-CM | POA: Diagnosis not present

## 2018-02-25 DIAGNOSIS — F0390 Unspecified dementia without behavioral disturbance: Secondary | ICD-10-CM | POA: Diagnosis not present

## 2018-02-25 DIAGNOSIS — Z85820 Personal history of malignant melanoma of skin: Secondary | ICD-10-CM | POA: Diagnosis not present

## 2018-02-25 DIAGNOSIS — Z853 Personal history of malignant neoplasm of breast: Secondary | ICD-10-CM | POA: Diagnosis not present

## 2018-02-25 LAB — CBC WITH DIFFERENTIAL/PLATELET
Abs Immature Granulocytes: 0.05 10*3/uL (ref 0.00–0.07)
Basophils Absolute: 0 10*3/uL (ref 0.0–0.1)
Basophils Relative: 0 %
Eosinophils Absolute: 0.4 10*3/uL (ref 0.0–0.5)
Eosinophils Relative: 5 %
HCT: 30.2 % — ABNORMAL LOW (ref 36.0–46.0)
Hemoglobin: 9.5 g/dL — ABNORMAL LOW (ref 12.0–15.0)
Immature Granulocytes: 1 %
Lymphocytes Relative: 13 %
Lymphs Abs: 1.2 10*3/uL (ref 0.7–4.0)
MCH: 29.6 pg (ref 26.0–34.0)
MCHC: 31.5 g/dL (ref 30.0–36.0)
MCV: 94.1 fL (ref 80.0–100.0)
Monocytes Absolute: 0.6 10*3/uL (ref 0.1–1.0)
Monocytes Relative: 7 %
Neutro Abs: 6.8 10*3/uL (ref 1.7–7.7)
Neutrophils Relative %: 74 %
Platelets: 163 10*3/uL (ref 150–400)
RBC: 3.21 MIL/uL — ABNORMAL LOW (ref 3.87–5.11)
RDW: 17.2 % — ABNORMAL HIGH (ref 11.5–15.5)
WBC: 9 10*3/uL (ref 4.0–10.5)
nRBC: 0 % (ref 0.0–0.2)

## 2018-02-25 LAB — BASIC METABOLIC PANEL
Anion gap: 12 (ref 5–15)
Anion gap: 11 (ref 5–15)
BUN: 46 mg/dL — ABNORMAL HIGH (ref 8–23)
BUN: 38 mg/dL — ABNORMAL HIGH (ref 8–23)
CO2: 29 mmol/L (ref 22–32)
CO2: 29 mmol/L (ref 22–32)
Calcium: 8.4 mg/dL — ABNORMAL LOW (ref 8.9–10.3)
Calcium: 8.9 mg/dL (ref 8.9–10.3)
Chloride: 97 mmol/L — ABNORMAL LOW (ref 98–111)
Chloride: 98 mmol/L (ref 98–111)
Creatinine, Ser: 2.55 mg/dL — ABNORMAL HIGH (ref 0.44–1.00)
Creatinine, Ser: 2.33 mg/dL — ABNORMAL HIGH (ref 0.44–1.00)
GFR calc Af Amer: 20 mL/min — ABNORMAL LOW (ref >60)
GFR calc Af Amer: 22 mL/min — ABNORMAL LOW (ref >60)
GFR calc non Af Amer: 17 mL/min — ABNORMAL LOW (ref >60)
GFR calc non Af Amer: 19 mL/min — ABNORMAL LOW (ref >60)
Glucose, Bld: 128 mg/dL — ABNORMAL HIGH (ref 70–99)
Glucose, Bld: 182 mg/dL — ABNORMAL HIGH (ref 70–99)
Potassium: 2.1 mmol/L — CL (ref 3.5–5.1)
Potassium: 2.9 mmol/L — ABNORMAL LOW (ref 3.5–5.1)
Sodium: 138 mmol/L (ref 135–145)
Sodium: 138 mmol/L (ref 135–145)

## 2018-02-25 LAB — CBC
HCT: 35.1 % — ABNORMAL LOW (ref 36.0–46.0)
Hemoglobin: 11 g/dL — ABNORMAL LOW (ref 12.0–15.0)
MCH: 29.3 pg (ref 26.0–34.0)
MCHC: 31.3 g/dL (ref 30.0–36.0)
MCV: 93.6 fL (ref 80.0–100.0)
Platelets: 166 10*3/uL (ref 150–400)
RBC: 3.75 MIL/uL — ABNORMAL LOW (ref 3.87–5.11)
RDW: 17.2 % — ABNORMAL HIGH (ref 11.5–15.5)
WBC: 10.4 10*3/uL (ref 4.0–10.5)
nRBC: 0 % (ref 0.0–0.2)

## 2018-02-25 LAB — GLUCOSE, CAPILLARY
Glucose-Capillary: 114 mg/dL — ABNORMAL HIGH (ref 70–99)
Glucose-Capillary: 121 mg/dL — ABNORMAL HIGH (ref 70–99)
Glucose-Capillary: 126 mg/dL — ABNORMAL HIGH (ref 70–99)
Glucose-Capillary: 113 mg/dL — ABNORMAL HIGH (ref 70–99)
Glucose-Capillary: 106 mg/dL — ABNORMAL HIGH (ref 70–99)
Glucose-Capillary: 216 mg/dL — ABNORMAL HIGH (ref 70–99)
Glucose-Capillary: 108 mg/dL — ABNORMAL HIGH (ref 70–99)
Glucose-Capillary: 130 mg/dL — ABNORMAL HIGH (ref 70–99)

## 2018-02-25 LAB — MRSA PCR SCREENING: MRSA by PCR: NEGATIVE

## 2018-02-25 LAB — COMPREHENSIVE METABOLIC PANEL
ALT: 10 U/L (ref 0–44)
AST: 35 U/L (ref 15–41)
Albumin: 2.7 g/dL — ABNORMAL LOW (ref 3.5–5.0)
Alkaline Phosphatase: 65 U/L (ref 38–126)
Anion gap: 15 (ref 5–15)
BUN: 42 mg/dL — ABNORMAL HIGH (ref 8–23)
CO2: 28 mmol/L (ref 22–32)
Calcium: 9.1 mg/dL (ref 8.9–10.3)
Chloride: 96 mmol/L — ABNORMAL LOW (ref 98–111)
Creatinine, Ser: 2.48 mg/dL — ABNORMAL HIGH (ref 0.44–1.00)
GFR calc Af Amer: 21 mL/min — ABNORMAL LOW (ref >60)
GFR calc non Af Amer: 18 mL/min — ABNORMAL LOW (ref >60)
Glucose, Bld: 123 mg/dL — ABNORMAL HIGH (ref 70–99)
Potassium: 2.5 mmol/L — CL (ref 3.5–5.1)
Sodium: 139 mmol/L (ref 135–145)
Total Bilirubin: 0.6 mg/dL (ref 0.3–1.2)
Total Protein: 6 g/dL — ABNORMAL LOW (ref 6.5–8.1)

## 2018-02-25 LAB — URIC ACID: Uric Acid, Serum: 9 mg/dL — ABNORMAL HIGH (ref 2.5–7.1)

## 2018-02-25 LAB — ECHOCARDIOGRAM COMPLETE
Height: 66 in
Weight: 3061.75 oz

## 2018-02-25 LAB — MAGNESIUM: Magnesium: 1.8 mg/dL (ref 1.7–2.4)

## 2018-02-25 LAB — TSH: TSH: 2.908 u[IU]/mL (ref 0.350–4.500)

## 2018-02-25 LAB — TROPONIN I
Troponin I: 0.04 ng/mL (ref <0.03)
Troponin I: 0.03 ng/mL (ref <0.03)

## 2018-02-25 LAB — CREATININE, URINE, RANDOM: Creatinine, Urine: 26.51 mg/dL

## 2018-02-25 LAB — LACTIC ACID, PLASMA: Lactic Acid, Venous: 0.8 mmol/L (ref 0.5–1.9)

## 2018-02-25 LAB — PHOSPHORUS: Phosphorus: 3 mg/dL (ref 2.5–4.6)

## 2018-02-25 LAB — HEMOGLOBIN A1C
Hgb A1c MFr Bld: 7.1 % — ABNORMAL HIGH (ref 4.8–5.6)
Mean Plasma Glucose: 157.07 mg/dL

## 2018-02-25 LAB — SODIUM, URINE, RANDOM: Sodium, Ur: 84 mmol/L

## 2018-02-25 MED ORDER — TECHNETIUM TO 99M ALBUMIN AGGREGATED
4.1800 | Freq: Once | INTRAVENOUS | Status: AC | PRN
  Administered 2018-02-25: 4.18 via INTRAVENOUS

## 2018-02-25 MED ORDER — POTASSIUM CHLORIDE CRYS ER 20 MEQ PO TBCR
40.0000 meq | EXTENDED_RELEASE_TABLET | Freq: Two times a day (BID) | ORAL | Status: DC
  Administered 2018-02-25: 40 meq via ORAL
  Filled 2018-02-25: qty 2

## 2018-02-25 MED ORDER — METRONIDAZOLE 500 MG PO TABS
500.0000 mg | ORAL_TABLET | Freq: Three times a day (TID) | ORAL | Status: DC
  Administered 2018-02-26 – 2018-03-02 (×14): 500 mg via ORAL
  Filled 2018-02-25 (×14): qty 1

## 2018-02-25 MED ORDER — HEPARIN SODIUM (PORCINE) 5000 UNIT/ML IJ SOLN
5000.0000 [IU] | Freq: Three times a day (TID) | INTRAMUSCULAR | Status: DC
  Administered 2018-02-25 – 2018-03-02 (×17): 5000 [IU] via SUBCUTANEOUS
  Filled 2018-02-25 (×17): qty 1

## 2018-02-25 MED ORDER — INSULIN ASPART 100 UNIT/ML ~~LOC~~ SOLN
0.0000 [IU] | Freq: Three times a day (TID) | SUBCUTANEOUS | Status: DC
  Administered 2018-02-25: 3 [IU] via SUBCUTANEOUS
  Administered 2018-02-26: 2 [IU] via SUBCUTANEOUS
  Administered 2018-02-26 (×2): 1 [IU] via SUBCUTANEOUS
  Administered 2018-02-27 – 2018-02-28 (×3): 2 [IU] via SUBCUTANEOUS
  Administered 2018-03-02: 1 [IU] via SUBCUTANEOUS

## 2018-02-25 MED ORDER — MAGNESIUM SULFATE IN D5W 1-5 GM/100ML-% IV SOLN
1.0000 g | Freq: Once | INTRAVENOUS | Status: AC
  Administered 2018-02-25: 1 g via INTRAVENOUS
  Filled 2018-02-25: qty 100

## 2018-02-25 MED ORDER — SODIUM CHLORIDE 0.9 % IV SOLN
INTRAVENOUS | Status: DC
  Administered 2018-02-26 – 2018-02-27 (×3): via INTRAVENOUS

## 2018-02-25 MED ORDER — MAGNESIUM SULFATE 2 GM/50ML IV SOLN
2.0000 g | Freq: Once | INTRAVENOUS | Status: AC
  Administered 2018-02-25: 2 g via INTRAVENOUS
  Filled 2018-02-25: qty 50

## 2018-02-25 MED ORDER — POTASSIUM CHLORIDE 20 MEQ PO PACK
40.0000 meq | PACK | ORAL | Status: AC
  Administered 2018-02-25 (×2): 40 meq via ORAL
  Filled 2018-02-25 (×2): qty 2

## 2018-02-25 MED ORDER — SODIUM CHLORIDE 0.9 % IV BOLUS
500.0000 mL | Freq: Once | INTRAVENOUS | Status: AC
  Administered 2018-02-25: 500 mL via INTRAVENOUS

## 2018-02-25 MED ORDER — POTASSIUM CHLORIDE 20 MEQ PO PACK
40.0000 meq | PACK | ORAL | Status: AC
  Administered 2018-02-25 – 2018-02-26 (×2): 40 meq via ORAL
  Filled 2018-02-25 (×2): qty 2

## 2018-02-25 MED ORDER — TECHNETIUM TC 99M DIETHYLENETRIAME-PENTAACETIC ACID
31.2000 | Freq: Once | INTRAVENOUS | Status: AC | PRN
  Administered 2018-02-25: 31.2 via RESPIRATORY_TRACT

## 2018-02-25 NOTE — Progress Notes (Signed)
   Subjective:  Patient reports pain as mild.   Admitted last night for concern of sepsis.  Objective:   VITALS:   Vitals:   02/25/18 0545 02/25/18 0600 02/25/18 0630 02/25/18 0645  BP: 111/68 (!) 99/54 (!) 97/54 101/63  Pulse: 91 87 91 90  Resp: (!) 22 (!) 21 (!) 24 (!) 24  Temp:   98.2 F (36.8 C)   TempSrc:   Oral   SpO2: 100% 100% 99% 100%  Weight:      Height:        Right ankle/foot - incisions c/d/i - scant bloody drainage from incision - no cellulitis, fluctuance, induration - foot wwp   Lab Results  Component Value Date   WBC 9.0 02/25/2018   HGB 9.5 (L) 02/25/2018   HCT 30.2 (L) 02/25/2018   MCV 94.1 02/25/2018   PLT 163 02/25/2018     Assessment/Plan:    4 days postop  - post surgical sites are unremarkable for infection, low suspicion for source of sepsis - dry dressings reapplied - elevate at all times - NWB RLE - up with PT when able  Eduard Roux 02/25/2018, 8:16 AM [phone]

## 2018-02-25 NOTE — Plan of Care (Signed)

## 2018-02-25 NOTE — Progress Notes (Signed)
VASCULAR LAB PRELIMINARY  PRELIMINARY  PRELIMINARY  PRELIMINARY  Bilateral lower extremity venous duplex completed.    Preliminary report:  There is no DVT or SVT noted in the bilateral lower extremities.   Kinzlee Selvy, RVT 02/25/2018, 11:11 AM

## 2018-02-25 NOTE — H&P (Addendum)
  [name] MRN:[id number] DOB: 01/10/1941 DOA: 02/24/2018     PCP: Fusco, Lawrence, MD   Outpatient Specialists:  CARDS:   Dr. Harding Endocrine Dr Balan NEphrology:   Dr. Patel NEurology     Dr. Sethi    Oncology  Dr. Feng   Orthopedics Dr. Duda Patient arrived to ER on 02/24/18 at 1431  Patient coming from: home Lives With family    Chief Complaint:  Right foot pain HPI: [name] is a 77 y.o. female with medical history significant of gout, Dm 2, CKD, CHF, hx of Breast ca in remission on PO Chemo, hx of Melanoma, Dementia, CVA/TIA, peripheral  neuropathy    Presented with  Sever right foot pain subjective fevers ever since discharge to home yesterday. Her fevers have been doing worse improved with percocet Decreased fluid intake Sp subtalar, talonavicular fusion with right lower extremity on 02/22/2018 Currently on wound vac plan to follow up in 7 days Patient reports severe foot pain, No cough no chest pain no SOB No blood per rectum She have had urinary retention  Reports decreased po intake   Regarding pertinent Chronic problems:CAD history  BMS PCI to the LAD in 2006   Nonischemic Myoview in August 2013  While in ER: Noted to be tachycardic and hypotensive  The following Work up has been ordered so far:  Orders Placed This Encounter  Procedures  . Culture, blood (x 2)  . MRSA PCR Screening  . DG Chest 2 View  . NM PULMONARY VENT AND PERF (V/Q Scan)  . Comprehensive metabolic panel  . CBC with Differential  . Urinalysis, Routine w reflex microscopic  . Lactic acid, plasma  . Procalcitonin  . Protime-INR  . APTT  . Magnesium  . Troponin I - Now Then Q6H  . Hemoglobin A1c  . Magnesium  . Phosphorus  . TSH  . Comprehensive metabolic panel  . CBC  . Glucose, capillary  . Diet NPO time specified  . Saline Lock IV, Maintain IV access  . Check Rectal Temperature  . Remove dressing  . Patient may eat/drink  . In and Out Cath  . Cardiac  monitoring  . If lactate (lactic acid) >2, verify repeat lactic acid order has been placed.  . Document vital signs within 1-hour of fluid bolus completion and notify provider of bolus completion  . Vital signs  . Vital signs  . Refer to Sidebar Report for: Sepsis Bundle ED/IP  . STAT CBG when hypoglycemia is suspected. If treated, recheck every 15 minutes after each treatment until CBG >/= 70 mg/dl  . Refer to Hypoglycemia Protocol Sidebar Report for treatment of CBG < 70 mg/dl  . Vital signs  . Notify physician  . Up with assistance  . If patient diabetic or glucose greater than 140 notify physician for Sliding Scale Insulin Orders  . May go off telemetry for tests/procedures  . Oral care per nursing protocol  . Initiate Oral Care Protocol  . Initiate Carrier Fluid Protocol  . RN may order General Admission PRN Orders utilizing "General Admission PRN medications" (through manage orders) for the following patient needs: allergy symptoms (Claritin), cold sores (Carmex), cough (Robitussin DM), eye irritation (Liquifilm Tears), hemorrhoids (Tucks), indigestion (Maalox), minor skin irritation (Hydrocortisone Cream), muscle pain (Ben Gay), nose irritation (saline nasal spray) and sore throat (Chloraseptic spray).  . Patient has an active order for admit to inpatient/place in observation  . Cardiac Monitoring - Continuous Indefinite  . Full code  .   Consult to hospitalist  . Pharmacy Consult  . ceFEPIme (MAXIPIME) per pharmacy consult  . vancomycin per pharmacy consult  . OT eval and treat  . PT eval and treat  . Pulse oximetry check with vital signs  . Oxygen therapy Mode or (Route): Nasal cannula; Liters Per Minute: 2; Keep 02 saturation: greater than 92 %  . Incentive spirometry  . ED EKG  . EKG 12-Lead  . EKG 12-Lead  . EKG 12-Lead  . Place in observation (patient's expected length of stay will be less than 2 midnights)  . Place in observation (patient's expected length of  stay will be less than 2 midnights)    Following Medications were ordered in ER: Medications  potassium chloride 10 mEq in 100 mL IVPB (0 mEq Intravenous Stopped 02/24/18 1736)  metroNIDAZOLE (FLAGYL) IVPB 500 mg (500 mg Intravenous New Bag/Given 02/24/18 2151)  vancomycin (VANCOCIN) IVPB 1000 mg/200 mL premix (1,000 mg Intravenous New Bag/Given 02/24/18 2349)  fentaNYL (SUBLIMAZE) injection 50 mcg (has no administration in time range)  allopurinol (ZYLOPRIM) tablet 100 mg (100 mg Oral Given 02/24/18 2156)  oxyCODONE-acetaminophen (PERCOCET/ROXICET) 5-325 MG per tablet 1 tablet (has no administration in time range)  exemestane (AROMASIN) tablet 25 mg (has no administration in time range)  rosuvastatin (CRESTOR) tablet 20 mg (has no administration in time range)  mirtazapine (REMERON) tablet 22.5 mg (22.5 mg Oral Given 02/24/18 2156)  insulin NPH Human (HUMULIN N,NOVOLIN N) injection 20 Units (has no administration in time range)  pantoprazole (PROTONIX) EC tablet 40 mg (has no administration in time range)  gabapentin (NEURONTIN) capsule 300 mg (300 mg Oral Given 02/24/18 2156)  acetaminophen (TYLENOL) tablet 650 mg (650 mg Oral Given 02/24/18 2156)    Or  acetaminophen (TYLENOL) suppository 650 mg ( Rectal See Alternative 02/24/18 2156)  ondansetron (ZOFRAN) tablet 4 mg (has no administration in time range)    Or  ondansetron (ZOFRAN) injection 4 mg (has no administration in time range)  insulin aspart (novoLOG) injection 0-9 Units (0 Units Subcutaneous Not Given 02/24/18 2200)  enoxaparin (LOVENOX) injection 30 mg (30 mg Subcutaneous Given 02/24/18 2156)  0.9 %  sodium chloride infusion (75 mLs Intravenous New Bag/Given 02/24/18 2145)  senna (SENOKOT) tablet 8.6 mg (8.6 mg Oral Given 02/24/18 2156)  insulin NPH Human (HUMULIN N,NOVOLIN N) injection 25 Units (25 Units Subcutaneous Given 02/24/18 2226)  ceFEPIme (MAXIPIME) 1 g in sodium chloride 0.9 % 100 mL IVPB (has no administration in  time range)  vancomycin (VANCOCIN) IVPB 1000 mg/200 mL premix (has no administration in time range)  sodium chloride 0.9 % bolus 500 mL (500 mLs Intravenous New Bag/Given 02/24/18 2348)  pramipexole (MIRAPEX) tablet 0.5 mg (has no administration in time range)  pramipexole (MIRAPEX) tablet 0.25 mg (has no administration in time range)  potassium chloride SA (K-DUR,KLOR-CON) CR tablet 40 mEq (40 mEq Oral Given 02/24/18 1611)  ceFEPIme (MAXIPIME) 2 g in sodium chloride 0.9 % 100 mL IVPB (2 g Intravenous New Bag/Given 02/24/18 2311)  sodium chloride 0.9 % bolus 500 mL (0 mLs Intravenous Stopped 02/24/18 2141)    Significant initial  Findings: Abnormal Labs Reviewed  COMPREHENSIVE METABOLIC PANEL - Abnormal; Notable for the following components:      Result Value   Potassium 2.2 (*)    Chloride 91 (*)    CO2 33 (*)    Glucose, Bld 136 (*)    BUN 51 (*)    Creatinine, Ser 2.92 (*)    Albumin 3.1 (*)      GFR calc non Af Amer 15 (*)    GFR calc Af Amer 17 (*)    All other components within normal limits  CBC WITH DIFFERENTIAL/PLATELET - Abnormal; Notable for the following components:   WBC 13.4 (*)    RBC 3.82 (*)    Hemoglobin 11.5 (*)    RDW 17.4 (*)    Neutro Abs 10.5 (*)    All other components within normal limits  URINALYSIS, ROUTINE W REFLEX MICROSCOPIC - Abnormal; Notable for the following components:   Color, Urine STRAW (*)    Hgb urine dipstick MODERATE (*)    Leukocytes, UA TRACE (*)    All other components within normal limits  MAGNESIUM - Abnormal; Notable for the following components:   Magnesium 1.6 (*)    All other components within normal limits  TROPONIN I - Abnormal; Notable for the following components:   Troponin I 0.05 (*)    All other components within normal limits  PROTIME-INR - Abnormal; Notable for the following components:   Prothrombin Time 15.9 (*)    All other components within normal limits  GLUCOSE, CAPILLARY - Abnormal; Notable for the following  components:   Glucose-Capillary 121 (*)    All other components within normal limits    Lactic Acid, Venous    Component Value Date/Time   LATICACIDVEN 1.1 02/24/2018 2112    Na 137 K 2.2  Cr   Up from baseline see below Lab Results  Component Value Date   CREATININE 2.92 (H) 02/24/2018   CREATININE 2.45 (H) 02/13/2018   CREATININE 2.22 (H) 08/15/2017      WBC   Up to 13.4  HG/HCT   Down  from baseline see below    Component Value Date/Time   HGB 11.5 (L) 02/24/2018 1457   HGB 12.6 02/13/2018 1114   HCT 36.0 02/24/2018 1457      Troponin (Point of Care Test) No results for input(s): TROPIPOC in the last 72 hours.    BNP (last 3 results) Recent Labs    08/01/17 1329  BNP 14.6    ProBNP (last 3 results) No results for input(s): PROBNP in the last 8760 hours.    UA  no evidence of UTI     CXR - NON acute     ECG:  Personally reviewed by me showing: HR : 107 Rhythm:  Sinus tachycardia     no evidence of ischemic changes QTC 490     ED Triage Vitals  Enc Vitals Group     BP 02/24/18 1436 (!) 143/78     Pulse Rate 02/24/18 1436 (!) 103     Resp 02/24/18 1436 18     Temp 02/24/18 1436 98.6 F (37 C)     Temp Source 02/24/18 1436 Oral     SpO2 02/24/18 1436 96 %     Weight --      Height --      Head Circumference --      Peak Flow --      Pain Score 02/24/18 1434 2     Pain Loc --      Pain Edu? --      Excl. in [place]? --   TMAX(24)@       Latest  Blood pressure (!) 143/78, pulse (!) 103, temperature 98.9 F (37.2 C), temperature source Rectal, resp. rate 19, SpO2 96 %.   Hospitalist was called for admission for possible SIRS possible source being right foot wound status post recent  operative intervention   Review of Systems:    Pertinent positives include: Fevers, chills,   fatigue, right foot pain  loss of appetite Constitutional:  No weight loss, night sweats, weight loss  HEENT:  No headaches, Difficulty swallowing,Tooth/dental  problems,Sore throat,  No sneezing, itching, ear ache, nasal congestion, post nasal drip,  Cardio-vascular:  No chest pain, Orthopnea, PND, anasarca, dizziness, palpitations.no Bilateral lower extremity swelling  GI:  No heartburn, indigestion, abdominal pain, nausea, vomiting, diarrhea, change in bowel habits,, melena, blood in stool, hematemesis Resp:  no shortness of breath at rest. No dyspnea on exertion, No excess mucus, no productive cough, No non-productive cough, No coughing up of blood.No change in color of mucus.No wheezing. Skin:  no rash or lesions. No jaundice GU:  no dysuria, change in color of urine, no urgency or frequency. No straining to urinate.  No flank pain.  Musculoskeletal:  No joint pain or no joint swelling. No decreased range of motion. No back pain.  Psych:  No change in mood or affect. No depression or anxiety. No memory loss.  Neuro: no localizing neurological complaints, no tingling, no weakness, no double vision, no gait abnormality, no slurred speech, no confusion  All systems reviewed and apart from [place] all are negative  Past Medical History:   Past Medical History:  Diagnosis Date  . Anemia    have received iron infusions  . Anxiety   . Arthritis     osteoarthritis  . Breast cancer ([place])   . Breast cancer in female [facility])    Bilateral  . CAD S/P percutaneous coronary angioplasty 2007; March 2011   [employer] 3.0 mm 20 mm postdilated 3.6 mm in early mid LAD; status post ISR Cutting Balloon PTCA and March 11 along with PCI of distal mid lesion with a 3.0 mm 12 mm MultiLink vision BMS; the proximal stent causes jailing of SP1 and SP2 with ostial 70-80% lesions  . Cancer (HCC)    Melanoma, Squamous cell Carcinoma  . CHF (congestive heart failure) ([place])   . Chronic kidney disease    stage 2   . Dementia ([place])    mild  . Diabetes mellitus type 2 with neurological manifestations ([place])   . Dyslipidemia, goal LDL below 70   . Full dentures   .  Genetic testing 12/03/2016   Germline genetic testing was performed through [facility] Common Hereditary Cancers Panel + [facility] Melanoma Panel. This custom panel includes analysis of the following 51 genes: APC, ATM, AXIN2, BAP1, BARD1, BMPR1A, BRCA1, BRCA2, BRIP1, CDH1, CDK4, CDKN2A, CHEK2, CTNNA1, DICER1, EPCAM, GREM1, HOXB13, KIT, MEN1, MITF, MLH1, MSH2, MSH3, MSH6, MUTYH, NBN, NF1, NTHL1, PALB2, PDGFRA, PMS2, POLD1, POL  . GERD (gastroesophageal reflux disease)   . Headache(784.0)   . History of hematuria    Followed by Dr. Gaynelle Arabian  . Hypertension, essential, benign   . Nausea & vomiting 10/2015  . Peripheral neuropathy   . Stroke ([place])   . TIA (transient ischemic attack)    multiple in the past  . Wears glasses       Past Surgical History:  Procedure Laterality Date  . ABDOMINAL HYSTERECTOMY    . ANKLE FUSION Right 02/22/2018   Procedure: RIGHT SUBTALAR AND TALONAVICULAR FUSION;  Surgeon: Newt Minion, MD;  Location: [facility];  Service: Orthopedics;  Laterality: Right;  . APPENDECTOMY    . BLADDER SUSPENSION    . BREAST LUMPECTOMY Bilateral 2018  . BREAST LUMPECTOMY WITH RADIOACTIVE SEED AND SENTINEL LYMPH NODE BIOPSY  Bilateral 12/14/2016   Procedure: BILATERAL BREAST LUMPECTOMY WITH BILATERAL  RADIOACTIVE SEED AND BILATERAL AXILLARY  SENTINEL LYMPH NODE BIOPSY ERAS PATHWAY;  Surgeon: Newman, David, MD;  Location: [facility];  Service: General;  Laterality: Bilateral;  PECTORAL BLOCK  . CARDIAC CATHETERIZATION  02/24/2006   85% stenosis in the proximal portion of LAD-arrangements made for PCI on 02/25/2006  . CARDIAC CATHETERIZATION  02/25/2006   80% LAD lesion stented with a 3x20mm [employer] stent resulting in reduction of 80% lesion to 0% residual  . CARDIAC CATHETERIZATION  04/26/2006   Medical management  . CARDIAC CATHETERIZATION  06/24/2009   60-70% re-stenosis in the proximal LAD. A 3.25x15 cutting balloon, 3 inflations - 14atm-38sec, 13atm-39sec, and 12atm-40sec reduced to less  than 10%. 60% stenosis of the mid/distal LAD stented with a 3x12mm Multilink stent.  . CARDIAC CATHETERIZATION  07/09/2009   Medical management  . CAROTID DOPPLER  06/24/2009   40-59% R ICA stenosis. No significant ICA stenosis noted  . CHOLECYSTECTOMY    . DILATION AND CURETTAGE OF UTERUS    . JOINT REPLACEMENT Left   . LESION REMOVAL Right 03/11/2015   Procedure:  EXCISION OF RIGHT PRE TIBIAL LESION;  Surgeon: James Wyatt, MD;  Location: [facility];  Service: General;  Laterality: Right;  . MASS EXCISION Left 06/10/2014   Procedure: EXCISION LEFT LOWER LEG LESION;  Surgeon: Jay Wyatt, MD;  Location: [facility];  Service: General;  Laterality: Left;  . MASS EXCISION Left 09/05/2015   Procedure: EXCISION OF LEFT FOREARM  MASS;  Surgeon: James Wyatt, MD;  Location: [facility];  Service: General;  Laterality: Left;  . NM MYOVIEW LTD  11/16/2011   5 beats a PVC during recovery.  No skin or infarction.  Reached 5 METs, EKG negative for ischemia   . NM MYOVIEW LTD  10/2011   No ischemia or infarction.  Normal EF.  LOW RISK. (Short bursts of 5 beats NSVT during recovery otherwise normal)  . SHOULDER ARTHROSCOPY  10/15  . SHOULDER ARTHROSCOPY  1995   left  . TEE WITHOUT CARDIOVERSION N/A 08/03/2017   Procedure: TRANSESOPHAGEAL ECHOCARDIOGRAM (TEE);  Surgeon: [place], Tiffany, MD;  Location: [facility];  Service: Cardiovascular;; EF 60-65% with no wall motion normalities.  No atrial thrombus noted.  Lightly findings consistent with a lipomatous hypertrophy of the atrial septum.   . TENDON REPAIR Left 05/12/2016   Procedure: Left Anterior Tibial Tendon Reconstruction;  Surgeon: Marcus Duda V, MD;  Location: [facility];  Service: Orthopedics;  Laterality: Left;  . TOTAL KNEE ARTHROPLASTY  2005   left  . TRANSESOPHAGEAL ECHOCARDIOGRAM  08/03/2016   : EF 60-65% no wall motion normalities.  No atrial thrombus.  Lipomatous hypertrophy of the atrial septum.  No mass  noted.  . TRANSTHORACIC ECHOCARDIOGRAM  10/2015   EF 55-60 %. Mild LVH. Mild pulmonary hypertension. Normal valves.  . TRANSTHORACIC ECHOCARDIOGRAM  07/2017   Vigorous LV function.  EF 65-70%.  Mild diastolic dysfunction with no RWMA. GR 1 DD.  Mild aortic sclerosis/no stenosis.  Questionable right atrial mass discounted with TEE)   . WRIST ARTHROPLASTY  2010   cancer lt wrist    Social History:  Ambulatory cane,      reports that she has never smoked. She has never used smokeless tobacco. She reports that she does not drink alcohol or use drugs.     Family History:   Family History  Problem Relation Age of Onset  . Diabetes   Mother   . Hypertension Mother   . Kidney disease Mother   . Hypertension Father   . Emphysema Father   . Heart attack Father   . Heart disease Father   . Arthritis Sister   . Diabetes Sister   . Hypertension Sister   . Breast cancer Sister 30       treated with mastectomy  . Cervical cancer Sister 20  . Hypertension Brother   . Hyperlipidemia Brother   . Diabetes Brother   . Stroke Brother   . Diabetes Sister   . Hypertension Sister   . Stomach cancer Sister 68  . Hypertension Brother   . ALS Brother        d.62s  . Breast cancer Daughter 40       triple negative treated with lumpectomy, chemo, radiation  . Heart attack Daughter 45       d.45  . COPD Daughter   . Cancer Paternal Aunt        unspecified type  . Cancer Paternal Uncle        unspecified type  . Cancer Paternal Uncle        unspecified type    Allergies: Allergies  Allergen Reactions  . Ultram [Tramadol] Other (See Comments)    Pt has seizure activity with this medication  . Lipitor [Atorvastatin] Other (See Comments)    Bone and muscle pain  . Lyrica [Pregabalin] Other (See Comments)    Weight gain, extremity swelling  . Reglan [Metoclopramide] Other (See Comments)    Tardive dyskinesia; paradoxical reaction not relieved by benadryl  . Nsaids Swelling    SWELLING  REACTION UNSPECIFIED      Prior to Admission medications   Medication Sig Start Date End Date Taking? Authorizing Provider  allopurinol (ZYLOPRIM) 100 MG tablet Take 1 tablet (100 mg total) by mouth 2 (two) times daily. 01/03/18  Yes Duda, Marcus V, MD  bumetanide (BUMEX) 2 MG tablet Take 2-4 mg by mouth See admin instructions. Take 2 tablets (4 mg) by mouth in the morning, take 1 tablet (2 mg) at 2pm, & take 1 tablet (2 mg) by mouth with supper.   Yes [provider]  Calcium Carb-Cholecalciferol (CALCIUM 600+D3) 600-800 MG-UNIT TABS Take 1 tablet by mouth daily.   Yes [provider]  clopidogrel (PLAVIX) 75 MG tablet TAKE 1 TABLET DAILY Patient taking differently: Take 75 mg by mouth daily.  01/18/18  Yes Harding, David W, MD  COD LIVER OIL PO Take 415 mg by mouth daily.   Yes [provider]  colchicine 0.6 MG tablet Take 1 tablet (0.6 mg total) by mouth daily. PRN gout flare Patient taking differently: Take 0.6 mg by mouth daily as needed (for foot pain/gout flare up x 3 days then discontinue).  01/19/18  Yes Duda, Marcus V, MD  Cranberry 500 MG TABS Take 500 mg by mouth every evening.   Yes [provider]  diazepam (VALIUM) 5 MG tablet Take 5 mg by mouth 2 (two) times daily as needed for anxiety.  07/20/17  Yes [provider]  exemestane (AROMASIN) 25 MG tablet TAKE 1 TABLET DAILY AFTER BREAKFAST Patient taking differently: Take 25 mg by mouth daily.  01/16/18  Yes Feng, Yan, MD  gabapentin (NEURONTIN) 300 MG capsule Take 1 capsule (300 mg total) by mouth 2 (two) times daily. Take 1 during the day if needed and 2 at night Patient taking differently: Take 300 mg by mouth 3 (three) times daily.   Morning, lunch, & bedtime. 09/01/17  Yes Dennie Bible, NP  insulin NPH Human (HUMULIN N,NOVOLIN N) 100 UNIT/ML injection Inject 20-25 Units into the skin See admin instructions. Inject 20 units in the morning & inject 25 units after supper.   Yes  [provider]  Javier Docker Oil 500 MG CAPS Take 500 mg by mouth daily.   Yes [provider]  Methylfol-Methylcob-Acetylcyst (CEREFOLIN NAC) 6-2-600 MG TABS TAKE 1 CAPLET BY MOUTH ONCE DAILY. Patient taking differently: Take 1 tablet by mouth daily.  07/05/17  Yes Garvin Fila, MD  metolazone (ZAROXOLYN) 2.5 MG tablet Take 30 min before the morning dose of bumex as directed daily. Patient taking differently: Take 2.5 mg by mouth See admin instructions. Take 30 min before the morning dose of bumex on Mondays and Thursdays twice a day 09/01/17  Yes Leonie Man, MD  metoprolol succinate (TOPROL XL) 50 MG 24 hr tablet TAKE ONE-HALF (1/2) TABLET DAILY Patient taking differently: Take 25 mg by mouth every evening.  09/13/17  Yes Leonie Man, MD  mirtazapine (REMERON) 15 MG tablet Take 22.5 mg by mouth at bedtime.    Yes [provider]  Nitroglycerin (NITROMIST) 400 MCG/SPRAY AERS Place 1 spray onto the tongue every 5 (five) minutes x 3 doses as needed (chest pain.).   Yes [provider]  oxyCODONE-acetaminophen (PERCOCET/ROXICET) 5-325 MG tablet Take 1 tablet by mouth every 4 (four) hours as needed. 02/23/18  Yes Newt Minion, MD  pantoprazole (PROTONIX) 40 MG tablet TAKE 1 TABLET DAILY (CONTACT OFFICE FOR ADDITIONAL REFILLS, FINAL ATTEMPT) Patient taking differently: Take 40 mg by mouth daily before breakfast.  02/02/16  Yes Leonie Man, MD  potassium chloride SA (K-DUR,KLOR-CON) 20 MEQ tablet Take 40 mEq by mouth 2 (two) times daily.    Yes [provider]  pramipexole (MIRAPEX) 0.25 MG tablet TAKE 1 TABLET AT 4:00 P.M. AND 2 TABLETS (0.5 MG) AT BEDTIME Patient taking differently: Take 0.25-0.5 mg by mouth See admin instructions. TAKE 0.25  AT 4:00 P.M. AND   (0.5 MG) AT BEDTIME 10/17/17  Yes Garvin Fila, MD  Resveratrol (RESERVAPAK PO) Take 1 tablet by mouth every evening.    Yes [provider]  rosuvastatin (CRESTOR) 20 MG tablet  Take 20 mg by mouth daily with supper.    Yes [provider]  Ubiquinol (QUNOL COQ10/UBIQUINOL/MEGA) 100 MG CAPS Take 100 mg by mouth every evening. CoQ10, Qunol    Yes [provider]   Physical Exam: Blood pressure (!) 143/78, pulse (!) 103, temperature 98.9 F (37.2 C), temperature source Rectal, resp. rate 19, SpO2 96 %. 1. General:  in No Acute distress   Chronically ill  acutely ill -appearing 2. Psychological: Alert and  Oriented to self and place 3. Head/ENT:    Dry Mucous Membranes                          Head Non traumatic, neck supple                            Poor Dentition 4. SKIN:   decreased Skin turgor,  Skin clean Dry and intact no rash 5. Heart: Regular rate and rhythm systolic  Murmur, no Rub or gallop 6. Lungs:   no wheezes or crackles   7. Abdomen: Soft, non-tender, Non distended     bowel sounds present 8. Lower extremities:  no clubbing, cyanosis, or  Edema to foot with dressing and wound VAC in place 9. Neurologically Grossly intact, moving all 4 extremities equally   10. MSK: Normal range of motion   LABS:     Recent Labs  Lab 02/24/18 1457  WBC 13.4*  NEUTROABS 10.5*  HGB 11.5*  HCT 36.0  MCV 94.2  PLT 182   Basic Metabolic Panel: Recent Labs  Lab 02/24/18 1457 02/24/18 1958  NA 137  --   K 2.2*  --   CL 91*  --   CO2 33*  --   GLUCOSE 136*  --   BUN 51*  --   CREATININE 2.92*  --   CALCIUM 9.3  --   MG  --  1.6*      Recent Labs  Lab 02/24/18 1457  AST 41  ALT 10  ALKPHOS 74  BILITOT 0.6  PROT 6.6  ALBUMIN 3.1*   No results for input(s): LIPASE, AMYLASE in the last 168 hours. No results for input(s): AMMONIA in the last 168 hours.    HbA1C: Recent Labs    02/22/18 0740  HGBA1C 7.1*   CBG: Recent Labs  Lab 02/22/18 1624 02/22/18 2104 02/23/18 0623 02/23/18 1126 02/24/18 2111  GLUCAP 137* 146* 131* 119* 121*      Urine analysis:    Component Value Date/Time   COLORURINE STRAW (A)  02/24/2018 1457   APPEARANCEUR CLEAR 02/24/2018 1457   LABSPEC 1.006 02/24/2018 1457   PHURINE 6.0 02/24/2018 1457   GLUCOSEU NEGATIVE 02/24/2018 1457   HGBUR MODERATE (A) 02/24/2018 1457   BILIRUBINUR NEGATIVE 02/24/2018 1457   KETONESUR NEGATIVE 02/24/2018 1457   PROTEINUR NEGATIVE 02/24/2018 1457   UROBILINOGEN 0.2 10/05/2013 0059   NITRITE NEGATIVE 02/24/2018 1457   LEUKOCYTESUR TRACE (A) 02/24/2018 1457       Cultures:    Component Value Date/Time   SDES URINE, CATHETERIZED 11/17/2015 0209   SPECREQUEST NONE 11/17/2015 0209   CULT (A) 11/17/2015 0209    50,000 COLONIES/mL LACTOBACILLUS SPECIES Standardized susceptibility testing for this organism is not available.    REPTSTATUS 11/18/2015 FINAL 11/17/2015 0209     Radiological Exams on Admission: Dg Chest 2 View  Result Date: 02/24/2018 CLINICAL DATA:  Episode of oxygen desaturation today. EXAM: CHEST - 2 VIEW COMPARISON:  PA and lateral chest 08/01/2017. FINDINGS: The lungs are clear. Bosselation of the right hemidiaphragm is noted. No pneumothorax or pleural effusion. Heart size is normal. Aortic atherosclerosis is seen. No acute or focal bony abnormality. IMPRESSION: No acute disease. Electronically Signed   By: Thomas  Dalessio M.D.   On: 02/24/2018 18:00    Chart has been reviewed    Assessment/Plan   77 y.o. female with medical history significant of gout, Dm 2, CKD, CHF, hx of Breast ca in remission on PO Chemo, hx of Melanoma, Dementia, CVA/TIA, peripheral  neuropathy  Admitted for  possible SIRS possible source being right foot wound status post recent operative intervention  Present on Admission: . Sepsis (HCC) most likely source being right foot wound at this point chest x-Koppen unremarkable no respiratory complaints UA unremarkable patient is reporting significant right foot pain.  Obtain lactic acid obtain blood cultures continue broad-spectrum antibiotics check MRSA discussed with orthopedics will see  patient in consult . Hypokalemia - will replace and repeat in AM,  check magnesium level and replace as needed  . Urinary retention -patient states she occasionally has to be catheterized at home with monitor for any evidence of   urinary retention in and out cath as needed . Essential hypertension given soft blood pressures we will hold home medications resume when able to tolerate . Dyslipidemia, goal LDL below 70 on Crestor we will continue  . CKD (chronic kidney disease) stage 3, GFR 30-59 ml/min (HCC) somewhat above baseline likely secondary to dehydration we will rehydrate gently avoid nephrotoxic medications . Chronic diastolic heart failure (HCC) - currently appears to be slightly on the dry side, hold home diuretics for tonight and restart when appears euvolemic, carefuly follow fluid status and Cr  . Dementia (HCC) chronic expect some degree of sundowning while hospitalized monitor for any evidence of delirium . GERD (gastroesophageal reflux disease) chronic and stable . Acute on chronic kidney failure (HCC) likely secondary to decreased p.o. intake will rehydrate and monitor fluid status . 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 Elevated troponin no active chest pain or shortness of breath given tachycardia and PE is on differential but unable to obtain CT of the chest secondary to chronic kidney disease will order VQ scan to be done in a.m. for demand ischemia if continues to trend downward benefit from cardiology consult order echogram to evaluate for wall motion normality check TSH . Hypomagnesemia we will replace and monitor History of CAD -hold Plavix for tonight patient had recently restarted for 1 dose she may need further surgical intervention  History of breast cancer continue home medications need to follow-up with  oncology as an outpatient Other plan as per orders.  DVT prophylaxis:   Lovenox     Code Status:  FULL CODE  as per patient  I had personally discussed CODE STATUS with patient and family   Family Communication:   Family  at  Bedside  plan of care was discussed with   Daughter  Disposition Plan:    likely will need placement for rehabilitation                    Would benefit from PT/OT eval prior to DC  Ordered                                       Consults called:  Orthopedics Dr. XU aware To notify Dr. Duda in a.m. the patient has been admitted will hold Plavix for tonight but okay to give Lovenox for DVT prophylaxis  Admission status:    Obs    Level of care           SDU tele indefinitely please discontinue once patient no longer qualifies         02/25/2018, 12:04 AM    [facility]  Pager [phone]   after 2 AM please page floor coverage PA If 7AM-7PM, please contact the day team taking care of the patient  [email or web address]  Password [email or web address]  

## 2018-02-25 NOTE — Progress Notes (Signed)
PT Cancellation Note  Patient Details Name: [name] MRN: [id number] DOB: 1940/12/20   Cancelled Treatment:    Reason Eval/Treat Not Completed: Patient at procedure or test/unavailable, nuc med   Duncan Dull 02/25/2018, 10:04 AM

## 2018-02-25 NOTE — Progress Notes (Addendum)
   02/25/18 1520  Vitals  BP (!) 85/56  MAP (mmHg) 66  Pulse Rate (!) 106  ECG Heart Rate (!) 106  Resp 18  Oxygen Therapy  SpO2 96 %  notified dr Grandville Silos, pt states feeling dizzy and feel like passing out.  Lasted a few minutes but now feeling okay. Will continue to monitor

## 2018-02-25 NOTE — Progress Notes (Addendum)
PROGRESS NOTE    [name]  UVO:[id number] DOB: Nov 12, 1940 DOA: 02/24/2018 PCP: Redmond School, MD    Brief Narrative:  Patient is a pleasant 77 year old female history of chronic kidney disease stage IV, gout, type 2 diabetes, history of breast cancer in remission on oral chemotherapy, prior history of CVA/TIA, peripheral neuropathy status post recent subtalar, talonavicular fusion of right lower extremity on 02/22/2018 who was discharged home on 02/23/2018 presented back to the ED on 02/24/2018 with subjective fevers, severe right foot pain, decreased oral intake.  Patient noted on admission to have sepsis physiology with temp of 100.8, tachycardia, hypotension.  Patient also with severe hypokalemia, acute on chronic kidney disease stage IV, hypomagnesemia and some hypoxia noted.  Patient admitted to stepdown unit, blood cultures obtained, orthopedic consulted and patient placed empirically on IV antibiotics.   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  Patient noted to have systolic blood pressure drop as low as 68/40.  Blood cultures obtained.  Chest  x-Mikus negative for any acute infiltrate.  Urinalysis unremarkable.  Check urine cultures.  VQ scan ordered to rule out PE.  Check lower extremity Dopplers.  Patient seen in consultation by orthopedics who have a very low suspicion for a foot infection.  Continue IV fluids.  Incentive spirometry.  Continue empiric IV antibiotics of vancomycin, cefepime and Flagyl.  Follow.  2.  Hypotension Questionable etiology.  May be secondary to volume depletion in the setting of diuretics of Bumex and metolazone versus infectious etiology.  Chest x-Heroux negative for any acute infiltrate.  Urinalysis unremarkable.  Blood cultures pending.  Check urine cultures.  Patient has been assessed by orthopedics who have a low suspicion for foot infection at this time.  Blood pressure responding to IV fluids.  Check lower extremity Dopplers.  VQ scan pending to rule out PE.  Check a random cortisol level in the morning.  TSH within normal limits at 2.908.  Continue empiric IV antibiotics of vancomycin, cefepime and Flagyl.  Follow.  3.  Acute renal failure on chronic kidney disease stage IV Patient with a baseline creatinine anywhere from 1.5-2.  Creatinine on admission was 2.92.  Urinalysis nitrite negative, trace leukocytes, negative protein.  Check a urine sodium and a urine creatinine.  Continue gentle hydration with IV fluids.  Hold patient's metolazone and Bumex.  Will likely need to discontinue metolazone on discharge and may need to decrease Bumex to half home dose with close outpatient follow-up with nephrology.  Follow urine output.  4.  Urinary retention Patient with a history of urinary retention.  Patient states she does self catheterizations at home as needed. I and O cath every 6 hours as needed.  Follow.  5.  Elevated troponin Troponins minimally elevated and slowly trending down.  Likely secondary to problem #1.  Patient denies any overt chest pain.  VQ scan pending to rule out PE.  EKG on admission with a sinus  tachycardia with no ischemic changes noted.  Highly doubt if this is ACS.  Will monitor for now.  6.  Hypokalemia/hypomagnesemia Patient with severe hypokalemia on admission with a potassium level of 2.2.  Magnesium of 1.6.  Likely secondary to diuretics of Bumex and metolazone.  Potassium this morning is 2.5.  Magnesium of 1.8.  Will replete with potassium supplementation 40 mEq every 4 hours x 2 doses.  Magnesium 2 g IV x1 with goal to keep magnesium greater than 2.  Follow.  7.  Dehydration Gentle hydration with IV fluids.  Follow.  8.  Hypertension Continue to hold antihypertensive medications.  9.  Dyslipidemia Continue statin.  10.  Chronic diastolic heart failure Patient on the dry side.  Patient noted to be hypotensive on presentation with blood pressure dropping as low as 68/40.  Patient on IV fluids monitor fluid status closely.  11.  Dementia Stable.  Patient alert and oriented answering questions appropriately.  Monitor for sundowning.  12.  Gastroesophageal reflux disease Stable.  Continue PPI.  13.  Type 2 diabetes mellitus with complications Hemoglobin A1c 7.1.  Place on a carb modified diet.  Continue home regimen of NPH 20 units in the morning and 25 units at bedtime.  Continue sliding scale insulin.  Change CBGs to before meals and at bedtime.  14.  History of gout Stable.  Continue home regimen allopurinol.  15.  History of coronary artery disease Plavix held on admission due to concern of possible surgical intervention.  Would likely resume Plavix tomorrow.  16.  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 Sharol Given Patient has been seen in consultation by orthopedics who has a low suspicion of a foot infection.  Per orthopedics nonweightbearing right lower extremity.  Up with PT when able.  Per orthopedics.  17.  History of breast cancer Continue Aromasin.  Outpatient  follow-up with oncology.  Patient requires inpatient admission as patient presented with concern for sepsis physiology with fevers, tachycardia, hypotension with systolic blood pressure of 68/40 requiring ongoing IV fluid resuscitation as well as ongoing empiric IV antibiotics for concern for sepsis.  Patient also with severe hypokalemia and at increased risk for arrhythmias and deterioration requiring replacement.  DVT prophylaxis: Heparin. Code Status: Full Family Communication: Updated patient and daughters at bedside. Disposition Plan: Likely back home when clinically improved, resolution of hypotension and afebrile x48 hours.   Consultants:   Orthopedics: Dr. Erlinda Hong 02/25/2018  Procedures:   Chest x-Prather 02/24/2018  VQ scan pending 02/25/2018  Antimicrobials:  IV cefepime 02/24/2018  IV Flagyl 02/24/2018  IV vancomycin 02/24/2018   Subjective: In bed.  Getting ready to go for VQ scan.  Denies any chest pain.  No shortness of breath.  No cough.  Mild right foot pain.  Objective: Vitals:   02/25/18 0715 02/25/18 0800 02/25/18 0900 02/25/18 0905  BP: 120/70 128/70 108/74 125/64  Pulse: 91 (!) 102 (!) 105 (!) 109  Resp: (!) 22 17 (!) 27 (!) 22  Temp:  98.5 F (36.9 C)    TempSrc:  Oral    SpO2: 100% 100% 100% 100%  Weight:      Height:        Intake/Output Summary (  Last 24 hours) at 02/25/2018 1001 Last data filed at 02/25/2018 0900 Gross per 24 hour  Intake 2371.25 ml  Output 3075 ml  Net -703.75 ml   Filed Weights   02/24/18 2105  Weight: 86.8 kg    Examination:  General exam: Appears calm and comfortable  Respiratory system: Clear to auscultation. Respiratory effort normal. Cardiovascular system: Tachycardia.  No JVD, murmurs, rubs, gallops or clicks.  Trace to 1+ right lower extremity edema.  Gastrointestinal system: Abdomen is nondistended, soft and nontender. No organomegaly or masses felt. Normal bowel sounds heard. Central nervous system: Alert and  oriented. No focal neurological deficits. Extremities: Trace to 1+ right lower extremity edema.  Right foot wrapped in bandage.  Skin: No rashes, lesions or ulcers Psychiatry: Judgement and insight appear normal. Mood & affect appropriate.     Data Reviewed: I have personally reviewed following labs and imaging studies  CBC: Recent Labs  Lab 02/24/18 1457 02/25/18 0212 02/25/18 0725  WBC 13.4* 9.0 10.4  NEUTROABS 10.5* 6.8  --   HGB 11.5* 9.5* 11.0*  HCT 36.0 30.2* 35.1*  MCV 94.2 94.1 93.6  PLT 182 163 177   Basic Metabolic Panel: Recent Labs  Lab 02/24/18 1457 02/24/18 1958 02/25/18 0212 02/25/18 0725  NA 137  --  138 139  K 2.2*  --  2.1* 2.5*  CL 91*  --  97* 96*  CO2 33*  --  29 28  GLUCOSE 136*  --  128* 123*  BUN 51*  --  46* 42*  CREATININE 2.92*  --  2.55* 2.48*  CALCIUM 9.3  --  8.4* 9.1  MG  --  1.6*  --  1.8  PHOS  --   --   --  3.0   GFR: Estimated Creatinine Clearance: 20.4 mL/min (A) (by C-G formula based on SCr of 2.48 mg/dL (H)). Liver Function Tests: Recent Labs  Lab 02/24/18 1457 02/25/18 0725  AST 41 35  ALT 10 10  ALKPHOS 74 65  BILITOT 0.6 0.6  PROT 6.6 6.0*  ALBUMIN 3.1* 2.7*   No results for input(s): LIPASE, AMYLASE in the last 168 hours. No results for input(s): AMMONIA in the last 168 hours. Coagulation Profile: Recent Labs  Lab 02/24/18 2112  INR 1.28   Cardiac Enzymes: Recent Labs  Lab 02/24/18 2027 02/25/18 0212 02/25/18 0725  TROPONINI 0.05* 0.04* 0.03*   BNP (last 3 results) No results for input(s): PROBNP in the last 8760 hours. HbA1C: Recent Labs    02/24/18 2112  HGBA1C 7.1*   CBG: Recent Labs  Lab 02/24/18 2111 02/24/18 2335 02/25/18 0340 02/25/18 0724 02/25/18 0755  GLUCAP 121* 121* 114* 126* 113*   Lipid Profile: No results for input(s): CHOL, HDL, LDLCALC, TRIG, CHOLHDL, LDLDIRECT in the last 72 hours. Thyroid Function Tests: Recent Labs    02/25/18 0212  TSH 2.908   Anemia  Panel: No results for input(s): VITAMINB12, FOLATE, FERRITIN, TIBC, IRON, RETICCTPCT in the last 72 hours. Sepsis Labs: Recent Labs  Lab 02/24/18 1510 02/24/18 2112 02/24/18 2346  PROCALCITON  --  1.33  --   LATICACIDVEN 1.21 1.1 0.8    Recent Results (from the past 240 hour(s))  MRSA PCR Screening     Status: None   Collection Time: 02/24/18  9:06 PM  Result Value Ref Range Status   MRSA by PCR NEGATIVE NEGATIVE Final    Comment:        The GeneXpert MRSA Assay (FDA approved for NASAL specimens only),  is one component of a comprehensive MRSA colonization surveillance program. It is not intended to diagnose MRSA infection nor to guide or monitor treatment for MRSA infections. Performed at [facility], [place] [address] [place], [place] [address]   Culture, blood (x 2)     Status: None (Preliminary result)   Collection Time: 02/24/18  9:10 PM  Result Value Ref Range Status   Specimen Description BLOOD RIGHT HAND  Final   Special Requests   Final    BOTTLES DRAWN AEROBIC AND ANAEROBIC Blood Culture results may not be optimal due to an excessive volume of blood received in culture bottles   Culture   Final    NO GROWTH < 12 HOURS Performed at [facility] [address] [place], [place] [address]    Report Status PENDING  Incomplete  Culture, blood (x 2)     Status: None (Preliminary result)   Collection Time: 02/24/18  9:21 PM  Result Value Ref Range Status   Specimen Description BLOOD RIGHT HAND  Final   Special Requests   Final    BOTTLES DRAWN AEROBIC ONLY Blood Culture adequate volume   Culture   Final    NO GROWTH < 12 HOURS Performed at [facility], [place] [address] [place],  [address]    Report Status PENDING  Incomplete         Radiology Studies: Dg Chest 2 View  Result Date: 02/24/2018 CLINICAL DATA:  Episode of oxygen desaturation today. EXAM: CHEST - 2 VIEW COMPARISON:  PA and lateral chest 08/01/2017. FINDINGS: The lungs  are clear. Bosselation of the right hemidiaphragm is noted. No pneumothorax or pleural effusion. Heart size is normal. Aortic atherosclerosis is seen. No acute or focal bony abnormality. IMPRESSION: No acute disease. Electronically Signed   By: Inge Rise M.D.   On: 02/24/2018 18:00        Scheduled Meds: . allopurinol  100 mg Oral BID  . exemestane  25 mg Oral Daily  . gabapentin  300 mg Oral TID  . heparin injection (subcutaneous)  5,000 Units Subcutaneous Q8H  . insulin aspart  0-9 Units Subcutaneous TID WC  . insulin NPH Human  20 Units Subcutaneous Q breakfast  . insulin NPH Human  25 Units Subcutaneous QHS  . pantoprazole  40 mg Oral QAC breakfast  . potassium chloride  40 mEq Oral Q4H  . pramipexole  0.25 mg Oral QHS  . pramipexole  0.5 mg Oral Daily  . rosuvastatin  20 mg Oral Q supper  . senna  1 tablet Oral BID   Continuous Infusions: . sodium chloride 100 mL/hr at 02/25/18 0839  . ceFEPime (MAXIPIME) IV    . magnesium sulfate 1 - 4 g bolus IVPB    . metronidazole 500 mg (02/25/18 0508)  . [START ON 02/26/2018] vancomycin       LOS: 0 days    Time spent: 40 minutes    Irine Seal, MD [facility] Pager [phone]  If 7PM-7AM, please contact night-coverage [email or web address] Password [email or web address] 02/25/2018, 10:01 AM

## 2018-02-25 NOTE — Progress Notes (Signed)
Pt having icecream, then appears to suddenly zone out, ?syncopal episode , became alert in a about 3 secs, states she's fine. Observation continues, relatives are with her.

## 2018-02-25 NOTE — Progress Notes (Signed)
CRITICAL VALUE ALERT  Critical Value: K+ 2.1  Date & Time Notied:02/25/18 415    Provider Notified: X. Blount NP  Orders Received/Actions taken: Order placed See [facility]

## 2018-02-25 NOTE — Progress Notes (Signed)
Patient ID: [name], female   DOB: [birth year], 77 y.o.   MRN: [id number]  Patient was getting VQ scan, I have ordered a uric acid, patient has a history of gout

## 2018-02-25 NOTE — Progress Notes (Addendum)
CRITICAL VALUE ALERT  Critical Value:  K+2.5, trop 0.03  Date & Time Notied:  02/25/2018 0902  Provider Notified: Dr. Cline Cools  Orders Received/Actions taken: new orders rec'd for potassium, will continue to monitor.

## 2018-02-25 NOTE — Progress Notes (Signed)
2330 RN was made aware the pt bp's had dropped in to the 90 - 80's / 50's with MAP in the low 60's. Pt is resting in bed is lethargic but easily aroused. Pt mental status is at baseline. MD made aware and gave 554ml bolus orders. RN followed orders. Will continue to monitor pt.  0030 Pt bp has not improved MD paged again and gave a repeat bolus order. MD came pt bedside and assessed pt. Will continue to monitor pt 0345 Pt voided 225 ml via purewick a post void bladder scan was completed per order and pt had 944 ml's in bladder. MD notified and gave order for an in and out cath. In and out cath done per protocol pt's output was 1330ml's MD made aware. Pt is resting comfortably with daughter at bedside. Will continue to monitor.

## 2018-02-26 LAB — CBC WITH DIFFERENTIAL/PLATELET
Abs Immature Granulocytes: 0.03 10*3/uL (ref 0.00–0.07)
Basophils Absolute: 0 10*3/uL (ref 0.0–0.1)
Basophils Relative: 1 %
Eosinophils Absolute: 0.4 10*3/uL (ref 0.0–0.5)
Eosinophils Relative: 5 %
HCT: 31.9 % — ABNORMAL LOW (ref 36.0–46.0)
Hemoglobin: 9.7 g/dL — ABNORMAL LOW (ref 12.0–15.0)
Immature Granulocytes: 0 %
Lymphocytes Relative: 12 %
Lymphs Abs: 1 10*3/uL (ref 0.7–4.0)
MCH: 29 pg (ref 26.0–34.0)
MCHC: 30.4 g/dL (ref 30.0–36.0)
MCV: 95.2 fL (ref 80.0–100.0)
Monocytes Absolute: 0.8 10*3/uL (ref 0.1–1.0)
Monocytes Relative: 9 %
Neutro Abs: 6.3 10*3/uL (ref 1.7–7.7)
Neutrophils Relative %: 73 %
Platelets: 196 10*3/uL (ref 150–400)
RBC: 3.35 MIL/uL — ABNORMAL LOW (ref 3.87–5.11)
RDW: 17.5 % — ABNORMAL HIGH (ref 11.5–15.5)
WBC: 8.6 10*3/uL (ref 4.0–10.5)
nRBC: 0 % (ref 0.0–0.2)

## 2018-02-26 LAB — URINE CULTURE: Culture: NO GROWTH

## 2018-02-26 LAB — GLUCOSE, CAPILLARY
Glucose-Capillary: 125 mg/dL — ABNORMAL HIGH (ref 70–99)
Glucose-Capillary: 188 mg/dL — ABNORMAL HIGH (ref 70–99)
Glucose-Capillary: 147 mg/dL — ABNORMAL HIGH (ref 70–99)
Glucose-Capillary: 165 mg/dL — ABNORMAL HIGH (ref 70–99)

## 2018-02-26 LAB — BASIC METABOLIC PANEL
Anion gap: 10 (ref 5–15)
BUN: 36 mg/dL — ABNORMAL HIGH (ref 8–23)
CO2: 26 mmol/L (ref 22–32)
Calcium: 8.8 mg/dL — ABNORMAL LOW (ref 8.9–10.3)
Chloride: 102 mmol/L (ref 98–111)
Creatinine, Ser: 2.19 mg/dL — ABNORMAL HIGH (ref 0.44–1.00)
GFR calc Af Amer: 24 mL/min — ABNORMAL LOW (ref >60)
GFR calc non Af Amer: 21 mL/min — ABNORMAL LOW (ref >60)
Glucose, Bld: 138 mg/dL — ABNORMAL HIGH (ref 70–99)
Potassium: 3.4 mmol/L — ABNORMAL LOW (ref 3.5–5.1)
Sodium: 138 mmol/L (ref 135–145)

## 2018-02-26 LAB — MAGNESIUM: Magnesium: 2.2 mg/dL (ref 1.7–2.4)

## 2018-02-26 LAB — CORTISOL: Cortisol, Plasma: 14.4 ug/dL

## 2018-02-26 MED ORDER — CLOPIDOGREL BISULFATE 75 MG PO TABS
75.0000 mg | ORAL_TABLET | Freq: Every day | ORAL | Status: DC
  Administered 2018-02-26 – 2018-03-02 (×5): 75 mg via ORAL
  Filled 2018-02-26 (×5): qty 1

## 2018-02-26 MED ORDER — METHOCARBAMOL 500 MG PO TABS
500.0000 mg | ORAL_TABLET | Freq: Four times a day (QID) | ORAL | Status: DC | PRN
  Filled 2018-02-26: qty 1

## 2018-02-26 MED ORDER — POTASSIUM CHLORIDE 20 MEQ/15ML (10%) PO SOLN
40.0000 meq | Freq: Once | ORAL | Status: AC
  Administered 2018-02-26: 40 meq via ORAL
  Filled 2018-02-26: qty 30

## 2018-02-26 MED ORDER — OXYCODONE-ACETAMINOPHEN 5-325 MG PO TABS
1.0000 | ORAL_TABLET | ORAL | Status: DC | PRN
  Administered 2018-02-26 – 2018-03-02 (×5): 1 via ORAL
  Filled 2018-02-26 (×5): qty 1

## 2018-02-26 NOTE — Progress Notes (Signed)
PROGRESS NOTE    [name]  HOZ:[id number] DOB: 03-Sep-1940 DOA: 02/24/2018 PCP: Redmond School, MD    Brief Narrative:  Patient is a pleasant 77 year old female history of chronic kidney disease stage IV, gout, type 2 diabetes, history of breast cancer in remission on oral chemotherapy, prior history of CVA/TIA, peripheral neuropathy status post recent subtalar, talonavicular fusion of right lower extremity on 02/22/2018 who was discharged home on 02/23/2018 presented back to the ED on 02/24/2018 with subjective fevers, severe right foot pain, decreased oral intake.  Patient noted on admission to have sepsis physiology with temp of 100.8, tachycardia, hypotension.  Patient also with severe hypokalemia, acute on chronic kidney disease stage IV, hypomagnesemia and some hypoxia noted.  Patient admitted to stepdown unit, blood cultures obtained, orthopedic consulted and patient placed empirically on IV antibiotics.   Assessment & Plan:   Principal Problem:   Sepsis ([place]) Active Problems:   Hypokalemia   Hypomagnesemia   CAD S/P percutaneous coronary angioplasty   Essential hypertension   Dyslipidemia, goal LDL below 70   Dementia (HCC)   GERD (gastroesophageal reflux disease)   CKD (chronic kidney disease) stage 4, GFR 15-29 ml/min (HCC)   Acute kidney injury superimposed on CKD ([place])   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 (HCC)   Malignant 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, estrogen receptor positive ([place])   Malignant neoplasm of upper-outer quadrant of left breast in female, estrogen receptor positive (HCC)   Chronic diastolic heart failure ([place])   Urinary retention   Elevated troponin  #1 rule out sepsis Patient presented with subjective fevers, significant right foot pain post recent surgery 02/22/2018, tachycardia and hypotension.  Patient noted to have systolic blood pressure drop as low as 68/40.  Blood cultures obtained.  Chest  x-Czaja negative for any acute infiltrate.  Urinalysis unremarkable.  Check urine cultures.  VQ scan ordered to rule out PE.  Check lower extremity Dopplers.  Patient seen in consultation by orthopedics who have a very low suspicion for a foot infection.  Continue IV fluids.  Incentive spirometry.  Continue empiric IV antibiotics of vancomycin, cefepime and Flagyl.  Follow.  2.  Hypotension Questionable etiology.  May be secondary to volume depletion in the setting of diuretics of Bumex and metolazone versus infectious etiology.  Chest x-Vaca negative for any acute infiltrate.  Urinalysis unremarkable.  Blood cultures pending.  Urine cultures negative. Patient has been assessed by orthopedics who have a low suspicion for foot infection at this time.  Blood pressure responding to IV fluids.  Lower extremity Dopplers negative for DVT.  VQ scan negative for PE.  Random cortisol at 14.4.  TSH within normal limits at 2.908.  Continue empiric IV antibiotics of vancomycin, cefepime and Flagyl.  If patient continues to spike fevers may need to image her right foot.  Follow.  3.  Acute renal failure on chronic kidney disease stage IV Patient with a baseline creatinine anywhere from 1.5-2.  Creatinine on admission was 2.92.  Urinalysis nitrite negative, trace leukocytes, negative protein.  Renal function slowly trending down with hydration.  Creatinine at 2.19.  Continue hydration with IV fluids. Hold patient's metolazone and Bumex.  Will likely need to discontinue metolazone on discharge and may need to decrease Bumex to half home dose with close outpatient follow-up with nephrology.  Follow urine output.  4.  Urinary retention Patient with a history of urinary retention.  Patient states she does self catheterizations at home as needed.  Continue I and O cath every 6 hours as needed.  Follow.  5.  Elevated troponin Troponins minimally elevated and slowly trending down.  Likely secondary to problem #1.  Patient  denies any overt chest pain.  VQ scan negative for PE.  EKG on admission with a sinus tachycardia with no ischemic changes noted.  Highly doubt if this is ACS.  Will monitor for now.  6.  Hypokalemia/hypomagnesemia Patient with severe hypokalemia on admission with a potassium level of 2.2.  Magnesium of 1.6.  Likely secondary to diuretics of Bumex and metolazone.  Potassium this morning is 3.4.  Magnesium of 2.2.  Will replete with potassium supplementation 40 mEq x1.  Follow.  7.  Dehydration Gentle hydration with IV fluids.  Follow.  8.  Hypertension Continue to hold antihypertensive medications/diuretics.  9.  Dyslipidemia Continue statin.  10.  Chronic diastolic heart failure Patient on the dry side.  Patient noted to be hypotensive on presentation with blood pressure dropping as low as 80/47.Marland Kitchen  Patient on IV fluids monitor fluid status closely.  Patient's diuretics have been discontinued.  11.  Dementia Stable.  Patient alert and oriented answering questions appropriately.  Monitor for sundowning.  12.  Gastroesophageal reflux disease Continue PPI.   13.  Type 2 diabetes mellitus with complications Hemoglobin A1c 7.1.  Continue carb modified diet.  CBG of 125 this morning. Continue home regimen of NPH 20 units in the morning and 25 units at bedtime.  Continue sliding scale insulin.   14.  History of gout Stable.  Continue home regimen allopurinol.  15.  History of coronary artery disease Plavix held on admission due to concern of possible surgical intervention.  Resume Plavix.   16.  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 Sharol Given Patient has been seen in consultation by orthopedics who has a low suspicion of a foot infection.  Per orthopedics nonweightbearing right lower extremity.  Up with PT when able.  Per orthopedics.  17.  History of breast cancer Continue Aromasin.   Outpatient follow-up with oncology.  Patient requires inpatient admission as patient presented with concern for sepsis physiology with fevers, tachycardia, hypotension with systolic blood pressure of 68/40 requiring ongoing IV fluid resuscitation as well as ongoing empiric IV antibiotics for concern for sepsis.  Patient also with severe hypokalemia and at increased risk for arrhythmias and deterioration requiring replacement.  DVT prophylaxis: Heparin. Code Status: Full Family Communication: Updated patient and daughter at bedside. Disposition Plan: Likely back home when clinically improved, resolution of hypotension and afebrile x48 hours.   Consultants:   Orthopedics: Dr. Erlinda Hong 02/25/2018/Dr. Sharol Given 02/25/2018  Procedures:   Chest x-Leyva 02/24/2018  VQ scan 02/25/2018  Lower extremity Dopplers 02/25/2018  Antimicrobials:  IV cefepime 02/24/2018  IV Flagyl 02/24/2018  IV vancomycin 02/24/2018   Subjective: Patient in bed.  States she is feeling better.  Complaining of some right foot pain however states not similar to her gout pain.  No chest pain.  No shortness of breath.  Blood pressure improving with hydration.    Objective: Vitals:   02/25/18 1930 02/25/18 2200 02/26/18 0730 02/26/18 0800  BP: (!) 92/53  (!) 109/58 (!) 116/56  Pulse: 89  92 (!) 102  Resp: (!) 23  16 12   Temp:  98.5 F (36.9 C) 98.5 F (36.9 C)   TempSrc:  Axillary Oral   SpO2: 100%  100% 100%  Weight:      Height:  Intake/Output Summary (Last 24 hours) at 02/26/2018 1058 Last data filed at 02/26/2018 0753 Gross per 24 hour  Intake 2960 ml  Output 2600 ml  Net 360 ml   Filed Weights   02/24/18 2105  Weight: 86.8 kg    Examination:  General exam: NAD. Respiratory system: Lungs clear to auscultation bilaterally anterior lung fields.  No murmurs rubs or gallops.  Respiratory effort normal. Cardiovascular system: Tachycardia.  No JVD, murmurs, rubs, gallops or clicks.  Trace to  right  lower extremity edema.  Gastrointestinal system: Abdomen is soft, nontender, nondistended, positive bowel sounds.  No rebound.  No guarding. Central nervous system: Alert and oriented. No focal neurological deficits. Extremities: Trace right lower extremity edema.  Right foot wrapped in bandage.  Skin: No rashes, lesions or ulcers Psychiatry: Judgement and insight appear normal. Mood & affect appropriate.     Data Reviewed: I have personally reviewed following labs and imaging studies  CBC: Recent Labs  Lab 02/24/18 1457 02/25/18 0212 02/25/18 0725 02/26/18 0236  WBC 13.4* 9.0 10.4 8.6  NEUTROABS 10.5* 6.8  --  6.3  HGB 11.5* 9.5* 11.0* 9.7*  HCT 36.0 30.2* 35.1* 31.9*  MCV 94.2 94.1 93.6 95.2  PLT 182 163 166 007   Basic Metabolic Panel: Recent Labs  Lab 02/24/18 1457 02/24/18 1958 02/25/18 0212 02/25/18 0725 02/25/18 1704 02/26/18 0236  NA 137  --  138 139 138 138  K 2.2*  --  2.1* 2.5* 2.9* 3.4*  CL 91*  --  97* 96* 98 102  CO2 33*  --  29 28 29 26   GLUCOSE 136*  --  128* 123* 182* 138*  BUN 51*  --  46* 42* 38* 36*  CREATININE 2.92*  --  2.55* 2.48* 2.33* 2.19*  CALCIUM 9.3  --  8.4* 9.1 8.9 8.8*  MG  --  1.6*  --  1.8  --  2.2  PHOS  --   --   --  3.0  --   --    GFR: Estimated Creatinine Clearance: 23.1 mL/min (A) (by C-G formula based on SCr of 2.19 mg/dL (H)). Liver Function Tests: Recent Labs  Lab 02/24/18 1457 02/25/18 0725  AST 41 35  ALT 10 10  ALKPHOS 74 65  BILITOT 0.6 0.6  PROT 6.6 6.0*  ALBUMIN 3.1* 2.7*   No results for input(s): LIPASE, AMYLASE in the last 168 hours. No results for input(s): AMMONIA in the last 168 hours. Coagulation Profile: Recent Labs  Lab 02/24/18 2112  INR 1.28   Cardiac Enzymes: Recent Labs  Lab 02/24/18 2027 02/25/18 0212 02/25/18 0725  TROPONINI 0.05* 0.04* 0.03*   BNP (last 3 results) No results for input(s): PROBNP in the last 8760 hours. HbA1C: Recent Labs    02/24/18 2112  HGBA1C 7.1*    CBG: Recent Labs  Lab 02/25/18 1200 02/25/18 1610 02/25/18 2118 02/25/18 2314 02/26/18 0748  GLUCAP 106* 216* 108* 130* 125*   Lipid Profile: No results for input(s): CHOL, HDL, LDLCALC, TRIG, CHOLHDL, LDLDIRECT in the last 72 hours. Thyroid Function Tests: Recent Labs    02/25/18 0212  TSH 2.908   Anemia Panel: No results for input(s): VITAMINB12, FOLATE, FERRITIN, TIBC, IRON, RETICCTPCT in the last 72 hours. Sepsis Labs: Recent Labs  Lab 02/24/18 1510 02/24/18 2112 02/24/18 2346  PROCALCITON  --  1.33  --   LATICACIDVEN 1.21 1.1 0.8    Recent Results (from the past 240 hour(s))  MRSA PCR Screening  Status: None   Collection Time: 02/24/18  9:06 PM  Result Value Ref Range Status   MRSA by PCR NEGATIVE NEGATIVE Final    Comment:        The GeneXpert MRSA Assay (FDA approved for NASAL specimens only), is one component of a comprehensive MRSA colonization surveillance program. It is not intended to diagnose MRSA infection nor to guide or monitor treatment for MRSA infections. Performed at [facility], [place] [address], [place], [place] [address]   Culture, blood (x 2)     Status: None (Preliminary result)   Collection Time: 02/24/18  9:10 PM  Result Value Ref Range Status   Specimen Description BLOOD RIGHT HAND  Final   Special Requests   Final    BOTTLES DRAWN AEROBIC AND ANAEROBIC Blood Culture results may not be optimal due to an excessive volume of blood received in culture bottles   Culture   Final    NO GROWTH 2 DAYS Performed at [facility], [place] [address], [place], [place] [address]    Report Status PENDING  Incomplete  Culture, blood (x 2)     Status: None (Preliminary result)   Collection Time: 02/24/18  9:21 PM  Result Value Ref Range Status   Specimen Description BLOOD RIGHT HAND  Final   Special Requests   Final    BOTTLES DRAWN AEROBIC ONLY Blood Culture adequate volume   Culture   Final    NO GROWTH 2 DAYS Performed at  [facility], [place] [address], [place], [place] [address]    Report Status PENDING  Incomplete  Urine Culture     Status: None   Collection Time: 02/25/18  3:15 PM  Result Value Ref Range Status   Specimen Description URINE, RANDOM  Final   Special Requests NONE  Final   Culture   Final    NO GROWTH Performed at [facility], [address] [place],  [address]    Report Status 02/26/2018 FINAL  Final         Radiology Studies: Dg Chest 2 View  Result Date: 02/24/2018 CLINICAL DATA:  Episode of oxygen desaturation today. EXAM: CHEST - 2 VIEW COMPARISON:  PA and lateral chest 08/01/2017. FINDINGS: The lungs are clear. Bosselation of the right hemidiaphragm is noted. No pneumothorax or pleural effusion. Heart size is normal. Aortic atherosclerosis is seen. No acute or focal bony abnormality. IMPRESSION: No acute disease. Electronically Signed   By: Inge Rise M.D.   On: 02/24/2018 18:00   Nm Pulmonary Vent And Perf (v/q Scan)  Result Date: 02/25/2018 CLINICAL DATA:  Clinical concern for pulmonary embolus. Low oxygen saturation. EXAM: NUCLEAR MEDICINE VENTILATION - PERFUSION LUNG SCAN TECHNIQUE: Ventilation images were obtained in multiple projections using inhaled aerosol Tc-30m DTPA. Perfusion images were obtained in multiple projections after intravenous injection of Tc-59m MAA. RADIOPHARMACEUTICALS:  31.2 mCi of Tc-26m DTPA aerosol inhalation and 4.2 mCi Tc34m MAA IV COMPARISON:  Chest radiograph 02/24/2017 FINDINGS: Ventilation: No focal ventilation defect. Mild clumping of the radiotracer in the central airways. Perfusion: No wedge shaped peripheral perfusion defects to suggest acute pulmonary embolism. IMPRESSION: No evidence of pulmonary embolus. Mild clumping of the inhaled radiotracer, suggestive of an element of COPD. Electronically Signed   By: Fidela Salisbury M.D.   On: 02/25/2018 11:20   Vas [place] Lower Extremity Venous (dvt)  Result Date:  02/25/2018  Lower Venous Study Indications: Pain.  Risk Factors: Sepsis. Post op right subtalar  and talonavicular fusion 02/22/18. Limitations: Pitting edema. Comparison Study: Prior negative study done 08/31/12, is available for                   comparison. Performing Technologist: Sharion Dove RVS  Examination Guidelines: A complete evaluation includes B-mode imaging, spectral Doppler, color Doppler, and power Doppler as needed of all accessible portions of each vessel. Bilateral testing is considered an integral part of a complete examination. Limited examinations for reoccurring indications may be performed as noted.  Right Venous Findings: +---------+---------------+---------+-----------+----------+----------+          CompressibilityPhasicitySpontaneityPropertiesSummary    +---------+---------------+---------+-----------+----------+----------+ CFV      Full           Yes      Yes                             +---------+---------------+---------+-----------+----------+----------+ SFJ      Full                                                    +---------+---------------+---------+-----------+----------+----------+ FV Prox  Full                                                    +---------+---------------+---------+-----------+----------+----------+ FV Mid   Full                                                    +---------+---------------+---------+-----------+----------+----------+ FV Distal               Yes      Yes                  visualized +---------+---------------+---------+-----------+----------+----------+ PFV      Full                                                    +---------+---------------+---------+-----------+----------+----------+ POP                     Yes      Yes                             +---------+---------------+---------+-----------+----------+----------+ PTV      Full                                                     +---------+---------------+---------+-----------+----------+----------+ PERO     Full                                                    +---------+---------------+---------+-----------+----------+----------+  Left Venous Findings: +---------+---------------+---------+-----------+----------+----------+          CompressibilityPhasicitySpontaneityPropertiesSummary    +---------+---------------+---------+-----------+----------+----------+ CFV      Full           Yes      Yes                             +---------+---------------+---------+-----------+----------+----------+ SFJ      Full                                                    +---------+---------------+---------+-----------+----------+----------+ FV Prox  Full                                                    +---------+---------------+---------+-----------+----------+----------+ FV Mid   Full                                                    +---------+---------------+---------+-----------+----------+----------+ FV Distal               Yes      Yes                  visualized +---------+---------------+---------+-----------+----------+----------+ PFV      Full                                                    +---------+---------------+---------+-----------+----------+----------+ POP      Full           Yes      Yes                             +---------+---------------+---------+-----------+----------+----------+ PTV      Full                                                    +---------+---------------+---------+-----------+----------+----------+ PERO     Full                                                    +---------+---------------+---------+-----------+----------+----------+    Summary: Right: There is no evidence of deep vein thrombosis in the lower extremity. However, portions of this examination were limited- see technologist comments above. Left: There is no  evidence of deep vein thrombosis in the lower extremity. However, portions of this examination were limited- see technologist comments above.  *See table(s) above for measurements and observations. Electronically signed by Servando Snare MD on 02/25/2018 at 1:51:18 PM.    Final         Scheduled Meds: .  allopurinol  100 mg Oral BID  . clopidogrel  75 mg Oral Daily  . exemestane  25 mg Oral Daily  . gabapentin  300 mg Oral TID  . heparin injection (subcutaneous)  5,000 Units Subcutaneous Q8H  . insulin aspart  0-9 Units Subcutaneous TID WC  . insulin NPH Human  20 Units Subcutaneous Q breakfast  . insulin NPH Human  25 Units Subcutaneous QHS  . metroNIDAZOLE  500 mg Oral Q8H  . pantoprazole  40 mg Oral QAC breakfast  . pramipexole  0.25 mg Oral QHS  . pramipexole  0.5 mg Oral Daily  . rosuvastatin  20 mg Oral Q supper  . senna  1 tablet Oral BID   Continuous Infusions: . sodium chloride 100 mL/hr at 02/26/18 0602  . ceFEPime (MAXIPIME) IV 1 g (02/25/18 2214)  . vancomycin       LOS: 1 day    Time spent: 40 minutes    Irine Seal, MD [facility] Pager [phone]  If 7PM-7AM, please contact night-coverage [email or web address] Password [email or web address] 02/26/2018, 10:58 AM

## 2018-02-26 NOTE — Evaluation (Signed)
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 PMH includes chronic CHF, CAD, DM, breast ca, CKD, dementia, HTN, neuropathy.,   Clinical Impression  Orders received for PT evaluation. Patient demonstrates deficits in functional mobility as indicated below. Will benefit from continued skilled PT to address deficits and maximize function. Will see as indicated and progress as tolerated.  Recommend return home with HHPT and use of w/c for safe mobility.    Follow Up Recommendations Home health PT;Supervision for mobility/OOB    Equipment Recommendations  None recommended by PT    Recommendations for Other Services       Precautions / Restrictions Precautions Precautions: Fall Required Braces or Orthoses: Other Brace Other Brace: CAM boot RLE(not donned this session) Restrictions Weight Bearing Restrictions: Yes RLE Weight Bearing: Non weight bearing      Mobility  Bed Mobility Overal bed mobility: Needs Assistance Bed Mobility: Supine to Sit     Supine to sit: Min guard;HOB elevated     General bed mobility comments: Able to get to EOB without assist; use of rail. Increased time and effort noted  Transfers Overall transfer level: Needs assistance   Transfers: Lateral/Scoot Transfers          Lateral/Scoot Transfers: Min assist General transfer comment: min assist for safety, performed in 3 incremental movements utilizing chuck pad for assist.   Ambulation/Gait             General Gait Details: Deferred  Stairs            Wheelchair Mobility    Modified Rankin (Stroke Patients Only)       Balance Overall balance assessment: Needs assistance;History of Falls Sitting-balance support: Feet supported;No upper extremity  supported Sitting balance-Leahy Scale: Fair                                       Pertinent Vitals/Pain Pain Assessment: Faces Faces Pain Scale: Hurts even more Pain Location: RLE Pain Descriptors / Indicators: Sore;Aching Pain Intervention(s): Monitored during session    Home Living Family/patient expects to be discharged to:: Private residence Living Arrangements: Spouse/significant other;Children Available Help at Discharge: Family;Available 24 hours/day Type of Home: House Home Access: Stairs to enter Entrance Stairs-Rails: Psychiatric nurse of Steps: 4 Home Layout: One level Home Equipment: Walker - 4 wheels;Wheelchair - manual      Prior Function Level of Independence: Needs assistance         Comments: Prior to a few weeks ago, very active, cooking/cleaning, getting on tractor     Hand Dominance   Dominant Hand: Right    Extremity/Trunk Assessment        Lower Extremity Assessment Lower Extremity Assessment: RLE deficits/detail RLE Deficits / Details: wound on back of heel RLE Sensation: decreased light touch       Communication   Communication: No difficulties  Cognition Arousal/Alertness: Awake/alert Behavior During Therapy: WFL for tasks assessed/performed Overall Cognitive Status: Impaired/Different from baseline Area of Impairment: Safety/judgement;Memory                               General Comments: patient slightly tangential and impulsive at times, cues for  safety with mobility      General Comments      Exercises General Exercises - Lower Extremity Ankle Circles/Pumps: Right;10 reps;Supine Long Arc Quad: AROM;Both;5 reps   Assessment/Plan    PT Assessment Patient needs continued PT services  PT Problem List Decreased strength;Decreased mobility;Decreased safety awareness;Decreased range of motion;Decreased knowledge of precautions;Decreased activity tolerance;Cardiopulmonary status  limiting activity;Decreased balance;Pain;Impaired sensation;Decreased knowledge of use of DME;Decreased cognition       PT Treatment Interventions DME instruction;Functional mobility training;Balance training;Patient/family education;Wheelchair mobility training;Gait training;Therapeutic activities;Therapeutic exercise    PT Goals (Current goals can be found in the Care Plan section)  Acute Rehab PT Goals Patient Stated Goal: to get home for thanksgiving PT Goal Formulation: With patient/family Time For Goal Achievement: 03/09/18 Potential to Achieve Goals: Good    Frequency Min 3X/week   Barriers to discharge Inaccessible home environment;Decreased caregiver support      Co-evaluation               AM-PAC PT "6 Clicks" Mobility  Outcome Measure Help needed turning from your back to your side while in a flat bed without using bedrails?: None Help needed moving from lying on your back to sitting on the side of a flat bed without using bedrails?: None Help needed moving to and from a bed to a chair (including a wheelchair)?: A Little Help needed standing up from a chair using your arms (e.g., wheelchair or bedside chair)?: A Lot Help needed to walk in hospital room?: Total Help needed climbing 3-5 steps with a railing? : Total 6 Click Score: 15    End of Session Equipment Utilized During Treatment: Gait belt Activity Tolerance: Patient tolerated treatment well Patient left: in chair;with call bell/phone within reach;with family/visitor present Nurse Communication: Mobility status PT Visit Diagnosis: Pain;Muscle weakness (generalized) (M62.81);Difficulty in walking, not elsewhere classified (R26.2);History of falling (Z91.81) Pain - Right/Left: Right Pain - part of body: Ankle and joints of foot    Time: 5329-9242 PT Time Calculation (min) (ACUTE ONLY): 21 min   Charges:   PT Evaluation $PT Eval Moderate Complexity: 1 Mod          Alben Deeds, PT DPT  Board  Certified Neurologic Specialist Acute Rehabilitation Services Pager [phone] Office [phone]   Duncan Dull 02/26/2018, 9:50 AM

## 2018-02-27 DIAGNOSIS — R600 Localized edema: Secondary | ICD-10-CM

## 2018-02-27 LAB — CBC WITH DIFFERENTIAL/PLATELET
Abs Immature Granulocytes: 0.04 10*3/uL (ref 0.00–0.07)
Basophils Absolute: 0 10*3/uL (ref 0.0–0.1)
Basophils Relative: 0 %
Eosinophils Absolute: 0.4 10*3/uL (ref 0.0–0.5)
Eosinophils Relative: 4 %
HCT: 29.6 % — ABNORMAL LOW (ref 36.0–46.0)
Hemoglobin: 9.3 g/dL — ABNORMAL LOW (ref 12.0–15.0)
Immature Granulocytes: 0 %
Lymphocytes Relative: 13 %
Lymphs Abs: 1.2 10*3/uL (ref 0.7–4.0)
MCH: 29.5 pg (ref 26.0–34.0)
MCHC: 31.4 g/dL (ref 30.0–36.0)
MCV: 94 fL (ref 80.0–100.0)
Monocytes Absolute: 0.7 10*3/uL (ref 0.1–1.0)
Monocytes Relative: 8 %
Neutro Abs: 6.9 10*3/uL (ref 1.7–7.7)
Neutrophils Relative %: 75 %
Platelets: 199 10*3/uL (ref 150–400)
RBC: 3.15 MIL/uL — ABNORMAL LOW (ref 3.87–5.11)
RDW: 17.4 % — ABNORMAL HIGH (ref 11.5–15.5)
WBC: 9.2 10*3/uL (ref 4.0–10.5)
nRBC: 0 % (ref 0.0–0.2)

## 2018-02-27 LAB — RENAL FUNCTION PANEL
Albumin: 2.3 g/dL — ABNORMAL LOW (ref 3.5–5.0)
Anion gap: 5 (ref 5–15)
BUN: 28 mg/dL — ABNORMAL HIGH (ref 8–23)
CO2: 32 mmol/L (ref 22–32)
Calcium: 8.6 mg/dL — ABNORMAL LOW (ref 8.9–10.3)
Chloride: 101 mmol/L (ref 98–111)
Creatinine, Ser: 1.98 mg/dL — ABNORMAL HIGH (ref 0.44–1.00)
GFR calc Af Amer: 27 mL/min — ABNORMAL LOW (ref >60)
GFR calc non Af Amer: 23 mL/min — ABNORMAL LOW (ref >60)
Glucose, Bld: 155 mg/dL — ABNORMAL HIGH (ref 70–99)
Phosphorus: 1.7 mg/dL — ABNORMAL LOW (ref 2.5–4.6)
Potassium: 2.8 mmol/L — ABNORMAL LOW (ref 3.5–5.1)
Sodium: 138 mmol/L (ref 135–145)

## 2018-02-27 LAB — GLUCOSE, CAPILLARY
Glucose-Capillary: 137 mg/dL — ABNORMAL HIGH (ref 70–99)
Glucose-Capillary: 189 mg/dL — ABNORMAL HIGH (ref 70–99)
Glucose-Capillary: 147 mg/dL — ABNORMAL HIGH (ref 70–99)
Glucose-Capillary: 139 mg/dL — ABNORMAL HIGH (ref 70–99)
Glucose-Capillary: 143 mg/dL — ABNORMAL HIGH (ref 70–99)

## 2018-02-27 MED ORDER — POTASSIUM CHLORIDE CRYS ER 20 MEQ PO TBCR
40.0000 meq | EXTENDED_RELEASE_TABLET | Freq: Two times a day (BID) | ORAL | Status: DC
  Administered 2018-02-28 – 2018-03-02 (×5): 40 meq via ORAL
  Filled 2018-02-27 (×5): qty 2

## 2018-02-27 MED ORDER — METOPROLOL SUCCINATE ER 25 MG PO TB24
25.0000 mg | ORAL_TABLET | Freq: Every evening | ORAL | Status: DC
  Filled 2018-02-27: qty 1

## 2018-02-27 MED ORDER — POTASSIUM PHOSPHATES 15 MMOLE/5ML IV SOLN
30.0000 mmol | Freq: Once | INTRAVENOUS | Status: AC
  Administered 2018-02-27: 30 mmol via INTRAVENOUS
  Filled 2018-02-27: qty 10

## 2018-02-27 MED ORDER — DIAZEPAM 5 MG PO TABS: 5.0000 mg | ORAL_TABLET | Freq: Two times a day (BID) | ORAL | Status: DC | PRN

## 2018-02-27 MED ORDER — POTASSIUM CHLORIDE 20 MEQ PO PACK
40.0000 meq | PACK | ORAL | Status: AC
  Administered 2018-02-27 (×2): 40 meq via ORAL
  Filled 2018-02-27 (×2): qty 2

## 2018-02-27 MED ORDER — MAGNESIUM SULFATE 2 GM/50ML IV SOLN
2.0000 g | Freq: Once | INTRAVENOUS | Status: AC
  Administered 2018-02-27: 2 g via INTRAVENOUS
  Filled 2018-02-27 (×2): qty 50

## 2018-02-27 MED ORDER — METOPROLOL SUCCINATE ER 25 MG PO TB24: 25.0000 mg | ORAL_TABLET | Freq: Every evening | ORAL | Status: DC

## 2018-02-27 MED ORDER — METOPROLOL SUCCINATE ER 25 MG PO TB24
25.0000 mg | ORAL_TABLET | Freq: Every evening | ORAL | Status: DC
Start: 2018-02-27 — End: 2018-02-28
  Administered 2018-02-27: 25 mg via ORAL
  Filled 2018-02-27: qty 1

## 2018-02-27 MED ORDER — BUMETANIDE 2 MG PO TABS
2.0000 mg | ORAL_TABLET | Freq: Two times a day (BID) | ORAL | Status: DC
  Administered 2018-02-28 – 2018-03-02 (×4): 2 mg via ORAL
  Filled 2018-02-27 (×6): qty 1

## 2018-02-27 MED ORDER — UBIQUINOL 100 MG PO CAPS: 100.0000 mg | ORAL_CAPSULE | Freq: Every evening | ORAL | Status: DC

## 2018-02-27 NOTE — Consult Note (Signed)
[facility] Nurse wound consult note Reason for Consult:Spoke with Dr Sharol Given regarding request for bilateral Profore wraps to manage edema. Has surgical woundto right heel, right lateral and dorsal foot.  All are intact suture lines.  NO erythema, or purulence noted. Some dried bloody effluent on the dressing noted. Bilateral legs are edematous and firm.  Intact skin except surgical wounds.  Wound type: Surgical wounds Pressure Injury POA: NA Measurement: Suture line:  4 cm right dorsal foot. /2 cm right lateral foot and 2 cm right heel.  Wound VFI:EPPIRJ line with approximated edges, although significant edema to legs and feet present.  Drainage (amount, consistency, odor) bleeding from suture lines Periwound:edema, generalized to bilateral legs from feet to knee.  Dressing procedure/placement/frequency: Cleanse suture lines to right foot and heel with NS. Apply nonadherent silicone contact layer.  Three layer Profore to bilateral lower legs. Change weekly. Patient informed to follow up with Dr Sharol Given after discharge for ongoing wound care and edema management.  Will not follow at this time.  Please re-consult if needed.  Domenic Moras MSN, RN, FNP-BC [facility] Wound, Ostomy, Continence Nurse Pager [phone]

## 2018-02-27 NOTE — Evaluation (Signed)
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 PMH includes chronic CHF, CAD, DM, breast ca, CKD, dementia, HTN, neuropathy.,    Clinical Impression   This 77 yo female admitted with above presents to acute OT with [place] RLE and generalized deconditioning thus affecting her safety and independence with basic ADLs--pta pt was independent to Mod I. She will benefit from acute OT with follow up OT at SNF.    Follow Up Recommendations  SNF;Supervision/Assistance - 24 hour    Equipment Recommendations  Other (comment)(TBD next venue)       Precautions / Restrictions Precautions Precautions: Fall Required Braces or Orthoses: Other Brace Other Brace: CAM boot RLE--now that they have wrapped her legs it is better if she only has it on for transfers (tight) Restrictions Weight Bearing Restrictions: Yes RLE Weight Bearing: Non weight bearing      Mobility Bed Mobility Overal bed mobility: Needs Assistance Bed Mobility: Sit to Supine       Sit to supine: Min assist   General bed mobility comments: for LEs and increased time, HOB flat,, use of rail  Transfers Overall transfer level: Needs assistance   Transfers: Lateral/Scoot Transfers          Lateral/Scoot Transfers: Mod assist;+2 physical assistance General transfer comment: pt had been sitting up in recliner for 5+hours and slightly uphill for transfer back--these two things may have accounted for increased need for A    Balance Overall balance assessment: Needs assistance;History of Falls Sitting-balance support: Feet supported;No upper extremity supported Sitting balance-Leahy Scale: Good     Standing balance support: During functional activity;Bilateral upper extremity supported                                ADL either performed or assessed with clinical judgement   ADL Overall ADL's : Needs assistance/impaired Eating/Feeding: Independent;Sitting   Grooming: Set up;Sitting   Upper Body Bathing: Set up;Sitting   Lower Body Bathing: Moderate assistance;Sitting/lateral leans   Upper Body Dressing : Set up;Sitting   Lower Body Dressing: Moderate assistance;Sitting/lateral leans   Toilet Transfer: Moderate assistance;+2 for physical assistance Toilet Transfer Details (indicate cue type and reason): lateral scoot recliner to bed going to pt's left Toileting- Clothing Manipulation and Hygiene: Moderate assistance;Sitting/lateral lean               Vision Patient Visual Report: No change from baseline              Pertinent Vitals/Pain Pain Assessment: 0-10 Pain Score: 3  Pain Location: RLE Pain Descriptors / Indicators: Sore;Aching;Throbbing(when in a dependent position) Pain Intervention(s): Monitored during session;Repositioned     Hand Dominance Right   Extremity/Trunk Assessment Upper Extremity Assessment Upper Extremity Assessment: Generalized weakness           Communication Communication Communication: No difficulties   Cognition Arousal/Alertness: Awake/alert Behavior During Therapy: WFL for tasks assessed/performed Overall Cognitive Status: Within Functional Limits for tasks assessed                                                Home Living Family/patient expects to  be discharged to:: Skilled nursing facility                                        Prior Functioning/Environment          Comments: pt and husband report that to get into house family had to lift her up steps in a transport chair and then had to lift her from transport chair into bed        OT Problem List: Decreased strength;Decreased range of motion;Impaired balance (sitting and/or standing);Pain;Obesity       OT Treatment/Interventions: Self-care/ADL training;Balance training;DME and/or AE instruction;Patient/family education;Therapeutic exercise    OT Goals(Current goals can be found in the care plan section) Acute Rehab OT Goals Patient Stated Goal: to go to rehab then home OT Goal Formulation: With patient Time For Goal Achievement: 03/13/18 Potential to Achieve Goals: Good  OT Frequency: Min 2X/week              AM-PAC OT "6 Clicks" Daily Activity     Outcome Measure Help from another person eating meals?: None Help from another person taking care of personal grooming?: A Little Help from another person toileting, which includes using toliet, bedpan, or urinal?: A Lot Help from another person bathing (including washing, rinsing, drying)?: A Lot Help from another person to put on and taking off regular upper body clothing?: A Little Help from another person to put on and taking off regular lower body clothing?: A Lot 6 Click Score: 16   End of Session Nurse Communication: (RN A with getting pt back to bed )  Activity Tolerance: Patient tolerated treatment well Patient left: in bed;with call bell/phone within reach;with bed alarm set  OT Visit Diagnosis: Other abnormalities of gait and mobility (R26.89);Pain Pain - Right/Left: Right Pain - part of body: Leg                Time: 8756-4332 OT Time Calculation (min): 38 min Charges:  OT General Charges $OT Visit: 1 Visit OT Evaluation $OT Eval Moderate Complexity: 1 Mod OT Treatments $Self Care/Home Management : 23-37 mins  [name], OTR/L Acute [employer] Pager [phone] Office [phone]     [name] 02/27/2018, 5:37 PM

## 2018-02-27 NOTE — Progress Notes (Signed)
Patient ID: [name], female   DOB: 12-23-1940, 77 y.o.   MRN: [id number] Patient is status post talonavicular and subtalar fusion on the right.  Her uric acid was 9.0 discussed that we will need to address this at follow-up in the office.  Patient does have increased venous swelling in both lower extremities will have a knotted.  Dressing applied to the right foot wounds and have the wound ostomy continence nursing provide a Profore wrap to both lower extremities.  Discussed the importance of elevation and nonweightbearing on the right.

## 2018-02-27 NOTE — Progress Notes (Signed)
PROGRESS NOTE    [name]  BPZ:[id number] DOB: 1940/05/22 DOA: 02/24/2018 PCP: Redmond School, MD    Brief Narrative:  Patient is a pleasant 77 year old female history of chronic kidney disease stage IV, gout, type 2 diabetes, history of breast cancer in remission on oral chemotherapy, prior history of CVA/TIA, peripheral neuropathy status post recent subtalar, talonavicular fusion of right lower extremity on 02/22/2018 who was discharged home on 02/23/2018 presented back to the ED on 02/24/2018 with subjective fevers, severe right foot pain, decreased oral intake.  Patient noted on admission to have sepsis physiology with temp of 100.8, tachycardia, hypotension.  Patient also with severe hypokalemia, acute on chronic kidney disease stage IV, hypomagnesemia and some hypoxia noted.  Patient admitted to stepdown unit, blood cultures obtained, orthopedic consulted and patient placed empirically on IV antibiotics.   Assessment & Plan:   Principal Problem:   Sepsis ([place]) Active Problems:   Hypokalemia   Hypomagnesemia   CAD S/P percutaneous coronary angioplasty   Essential hypertension   Dyslipidemia, goal LDL below 70   Dementia (HCC)   GERD (gastroesophageal reflux disease)   CKD (chronic kidney disease) stage 4, GFR 15-29 ml/min (HCC)   Acute kidney injury superimposed on CKD ([place])   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 (HCC)   Malignant 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, estrogen receptor positive ([place])   Malignant neoplasm of upper-outer quadrant of left breast in female, estrogen receptor positive (HCC)   Chronic diastolic heart failure ([place])   Urinary retention   Elevated troponin  #1 rule out sepsis Patient presented with subjective fevers, significant right foot pain post recent surgery 02/22/2018, tachycardia and hypotension.  Patient noted to have systolic blood pressure drop as low as 68/40.  Blood cultures obtained with no  growth to date.  Chest x-Bale negative for any acute infiltrate.  Urinalysis unremarkable.  Urine cultures negative. VQ scan negative for PE.  Lower extremity Dopplers negative. Patient seen in consultation by orthopedics who have a very low suspicion for a foot infection.  Saline lock IV fluids.  Continue incentive spirometry.  Continue empiric IV cefepime and Flagyl.  Discontinue IV vancomycin.  Could likely narrow down antibiotics tomorrow and treat empirically.   2.  Hypotension Questionable etiology.  May be secondary to volume depletion in the setting of diuretics of Bumex and metolazone versus infectious etiology.  Patient states has had times at home where his systolic blood pressures has been in the 60s and has felt symptomatic with dizziness and the feeling that she was leaving.  Chest x-Rathman negative for any acute infiltrate.  Urinalysis unremarkable.  Blood cultures pending with no growth today.  Urine cultures negative. Patient has been assessed by orthopedics who have a low suspicion for foot infection at this time.  Blood pressure responding to IV fluids.  Lower extremity Dopplers negative for DVT.  VQ scan negative for PE.  Random cortisol at 14.4.  TSH within normal limits at 2.908.  Continue empiric IV antibiotics of cefepime and Flagyl.  Discontinue IV vancomycin.  Patient noted to have a low-grade temp however currently afebrile.  Systolic blood pressures now in the low 100s.  Saline lock IV fluids as patient is starting to develop some lower extremity edema.  Patient currently asymptomatic.  If blood pressure drops further and patient becomes symptomatic on her Bumex, may need to start on midodrine 5 mg p.o. 3 times daily.  Follow for now.    3.  Acute  renal failure on chronic kidney disease stage IV Patient with a baseline creatinine anywhere from 1.5-2.  Creatinine on admission was 2.92.  Urinalysis nitrite negative, trace leukocytes, negative protein.  Renal function slowly trending down  with hydration.  Creatinine at 1.98 today 02/27/2018.  Good urine output 3.250 L over the past 24 hours.  Saline lock IV fluids as patient started to develop lower extremity edema.  Continue to hold metolazone.  Resume Bumex 2 mg p.o. twice daily tomorrow 02/28/2018.  Discussed with patient's nephrologist [name].   4.  Urinary retention Patient with a history of urinary retention.  Patient states she does self catheterizations at home as needed.  Continue I and O cath every 6 hours as needed.  Follow.  5.  Elevated troponin Troponins minimally elevated and slowly trending down.  Likely secondary to problem #1.  Patient denies any overt chest pain.  VQ scan negative for PE.  EKG on admission with a sinus tachycardia with no ischemic changes noted.  Highly doubt if this is ACS.  Will monitor for now.  6.  Hypokalemia/hypomagnesemia Patient with severe hypokalemia on admission with a potassium level of 2.2.  Magnesium of 1.6.  Likely secondary to diuretics of Bumex and metolazone.  Potassium this morning is 2.8.  Replete.  Resume home regimen of potassium supplementation tomorrow 02/28/2018.  Magnesium level at 1.7.  We will give magnesium sulfate 2 g IV x1.  Follow.  7.  Dehydration Saline lock IV fluids.  Follow.  8.  Hypertension Continue to hold antihypertensive medications/diuretics.  Saline lock IV fluids.  Will resume Bumex 2 mg p.o. twice daily in the morning.  Follow.  9.  Dyslipidemia Continue statin.  10.  Chronic diastolic heart failure Patient was on the dry side on admission.  Patient noted to be hypotensive on presentation with blood pressure dropping as low as 80/47.  Patient now developing some lower extremity edema and some bibasilar crackles however denies any shortness of breath and asymptomatic.  Saline lock IV fluids.  Start Bumex 2 mg p.o. twice daily tomorrow 02/28/2018.  11.  Dementia Stable.  Patient alert and oriented answering questions appropriately.  Monitor for  sundowning.  12.  Gastroesophageal reflux disease Continue PPI.   13.  Type 2 diabetes mellitus with complications Hemoglobin A1c 7.1.  Continue carb modified diet.  CBG of 137 this morning. Continue home regimen of NPH 20 units in the morning and 25 units at bedtime.  Continue sliding scale insulin.   14.  History of gout Stable.  Continue home regimen allopurinol.  15.  History of coronary artery disease Plavix held on admission due to concern of possible surgical intervention.  Plavix has been resumed.   16.  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 Sharol Given Patient has been seen in consultation by orthopedics who has a low suspicion of a foot infection.  Per orthopedics nonweightbearing right lower extremity.  Up with PT when able.  Per orthopedics.  17.  History of breast cancer Continue Aromasin.  Outpatient follow-up with oncology.  18.  Lower extremity edema Patient now with increasing lower extremity edema.  Saline lock IV fluids.  Patient was on Bumex and metolazone prior to admission however due to hypotension Bumex and metolazone was held.  Case discussed with patient's nephrologist, [name] and will discontinue IV fluids at this time.  Start Bumex 2 mg p.o. twice daily tomorrow 02/28/2018.  Orthopedics has ordered  Profore leg wraps.  Patient requires inpatient admission as patient presented with concern for sepsis physiology with fevers, tachycardia, hypotension with systolic blood pressure of 68/40 requiring ongoing IV fluid resuscitation as well as ongoing empiric IV antibiotics for concern for sepsis.  Patient also with severe hypokalemia and at increased risk for arrhythmias and deterioration requiring replacement.  DVT prophylaxis: Heparin. Code Status: Full Family Communication: Updated patient.  No family at bedside.  Disposition Plan: Likely back home when clinically  improved, resolution of hypotension and afebrile x48 hours.   Consultants:   Orthopedics: Dr. Erlinda Hong 02/25/2018/Dr. Sharol Given 02/25/2018  Procedures:   Chest x-M[place]in 02/24/2018  VQ scan 02/25/2018  Lower extremity Dopplers 02/25/2018  Antimicrobials:  IV cefepime 02/24/2018  IV Flagyl 02/24/2018  IV vancomycin 02/24/2018>>>>>> 02/27/2018   Subjective: Patient sitting up in chair.  Feeling better.  States right foot pain is intermittent and managed on current pain regimen.  Feels lower extremity swelling is coming back.  Denies any shortness of breath.  Denies any chest pain.  Patient states she has had bouts of low blood pressure at home with systolics going as low as the 60s and higher systolic blood pressure of 105.    Objective: Vitals:   02/27/18 0200 02/27/18 0300 02/27/18 0400 02/27/18 0800  BP: 114/63 (!) 113/56 130/66   Pulse: 98 (!) 103 (!) 110   Resp: (!) 33 (!) 36 18   Temp:   98.3 F (36.8 C) 98.3 F (36.8 C)  TempSrc:    Oral  SpO2: 93% 90% 94%   Weight:      Height:        Intake/Output Summary (Last 24 hours) at 02/27/2018 0923 Last data filed at 02/27/2018 0923 Gross per 24 hour  Intake 1771.29 ml  Output 2250 ml  Net -478.71 ml   Filed Weights   02/24/18 2105  Weight: 86.8 kg    Examination:  General exam: NAD. Respiratory system: Some bibasilar crackles.  No rhonchi.  No wheezing.  Normal respiratory effort.   Cardiovascular system: Regular rate rhythm no murmurs rubs or gallops.  No JVD.  Bilateral lower extremity edema right greater than left.  Right lower extremity edema 2+.  Gastrointestinal system: Abdomen is nontender, nondistended, soft, positive bowel sounds.  No rebound.  No guarding.  Central nervous system: Alert and oriented. No focal neurological deficits. Extremities: Bilateral lower extremity edema right greater than left.  Right foot wrapped in bandage.  Trace right lower extremity edema.  Right foot wrapped in bandage.  2+ right lower  extremity edema. Skin: No rashes, lesions or ulcers Psychiatry: Judgement and insight appear normal. Mood & affect appropriate.     Data Reviewed: I have personally reviewed following labs and imaging studies  CBC: Recent Labs  Lab 02/24/18 1457 02/25/18 0212 02/25/18 0725 02/26/18 0236 02/27/18 0205  WBC 13.4* 9.0 10.4 8.6 9.2  NEUTROABS 10.5* 6.8  --  6.3 6.9  HGB 11.5* 9.5* 11.0* 9.7* 9.3*  HCT 36.0 30.2* 35.1* 31.9* 29.6*  MCV 94.2 94.1 93.6 95.2 94.0  PLT 182 163 166 196 300   Basic Metabolic Panel: Recent Labs  Lab 02/24/18 1958 02/25/18 0212 02/25/18 0725 02/25/18 1704 02/26/18 0236 02/27/18 0205  NA  --  138 139 138 138 138  K  --  2.1* 2.5* 2.9* 3.4* 2.8*  CL  --  97* 96* 98 102 101  CO2  --  29 28 29 26  32  GLUCOSE  --  128* 123* 182* 138* 155*  BUN  --  46* 42* 38* 36* 28*  CREATININE  --  2.55* 2.48* 2.33* 2.19* 1.98*  CALCIUM  --  8.4* 9.1 8.9 8.8* 8.6*  MG 1.6*  --  1.8  --  2.2  --   PHOS  --   --  3.0  --   --  1.7*   GFR: Estimated Creatinine Clearance: 25.6 mL/min (A) (by C-G formula based on SCr of 1.98 mg/dL (H)). Liver Function Tests: Recent Labs  Lab 02/24/18 1457 02/25/18 0725 02/27/18 0205  AST 41 35  --   ALT 10 10  --   ALKPHOS 74 65  --   BILITOT 0.6 0.6  --   PROT 6.6 6.0*  --   ALBUMIN 3.1* 2.7* 2.3*   No results for input(s): LIPASE, AMYLASE in the last 168 hours. No results for input(s): AMMONIA in the last 168 hours. Coagulation Profile: Recent Labs  Lab 02/24/18 2112  INR 1.28   Cardiac Enzymes: Recent Labs  Lab 02/24/18 2027 02/25/18 0212 02/25/18 0725  TROPONINI 0.05* 0.04* 0.03*   BNP (last 3 results) No results for input(s): PROBNP in the last 8760 hours. HbA1C: Recent Labs    02/24/18 2112  HGBA1C 7.1*   CBG: Recent Labs  Lab 02/26/18 0748 02/26/18 1223 02/26/18 1731 02/26/18 2142 02/27/18 0813  GLUCAP 125* 188* 147* 165* 137*   Lipid Profile: No results for input(s): CHOL, HDL, LDLCALC,  TRIG, CHOLHDL, LDLDIRECT in the last 72 hours. Thyroid Function Tests: Recent Labs    02/25/18 0212  TSH 2.908   Anemia Panel: No results for input(s): VITAMINB12, FOLATE, FERRITIN, TIBC, IRON, RETICCTPCT in the last 72 hours. Sepsis Labs: Recent Labs  Lab 02/24/18 1510 02/24/18 2112 02/24/18 2346  PROCALCITON  --  1.33  --   LATICACIDVEN 1.21 1.1 0.8    Recent Results (from the past 240 hour(s))  MRSA PCR Screening     Status: None   Collection Time: 02/24/18  9:06 PM  Result Value Ref Range Status   MRSA by PCR NEGATIVE NEGATIVE Final    Comment:        The GeneXpert MRSA Assay (FDA approved for NASAL specimens only), is one component of a comprehensive MRSA colonization surveillance program. It is not intended to diagnose MRSA infection nor to guide or monitor treatment for MRSA infections. Performed at [facility], [place] [address] [place], [place] [address]   Culture, blood (x 2)     Status: None (Preliminary result)   Collection Time: 02/24/18  9:10 PM  Result Value Ref Range Status   Specimen Description BLOOD RIGHT HAND  Final   Special Requests   Final    BOTTLES DRAWN AEROBIC AND ANAEROBIC Blood Culture results may not be optimal due to an excessive volume of blood received in culture bottles   Culture   Final    NO GROWTH 3 DAYS Performed at [facility], [place] [address] [place], [place] [address]    Report Status PENDING  Incomplete  Culture, blood (x 2)     Status: None (Preliminary result)   Collection Time: 02/24/18  9:21 PM  Result Value Ref Range Status   Specimen Description BLOOD RIGHT HAND  Final   Special Requests   Final    BOTTLES DRAWN AEROBIC ONLY Blood Culture adequate volume   Culture   Final    NO GROWTH 3 DAYS Performed at [facility], [place] [address] [place], [place] [address]  Report Status PENDING  Incomplete  Urine Culture     Status: None   Collection Time: 02/25/18  3:15 PM  Result Value Ref Range  Status   Specimen Description URINE, RANDOM  Final   Special Requests NONE  Final   Culture   Final    NO GROWTH Performed at [facility], [address] [place], [place] [address]    Report Status 02/26/2018 FINAL  Final         Radiology Studies: Nm Pulmonary Vent And Perf (v/q Scan)  Result Date: 02/25/2018 CLINICAL DATA:  Clinical concern for pulmonary embolus. Low oxygen saturation. EXAM: NUCLEAR MEDICINE VENTILATION - PERFUSION LUNG SCAN TECHNIQUE: Ventilation images were obtained in multiple projections using inhaled aerosol Tc-64m DTPA. Perfusion images were obtained in multiple projections after intravenous injection of Tc-61m MAA. RADIOPHARMACEUTICALS:  31.2 mCi of Tc-44m DTPA aerosol inhalation and 4.2 mCi Tc68m MAA IV COMPARISON:  Chest radiograph 02/24/2017 FINDINGS: Ventilation: No focal ventilation defect. Mild clumping of the radiotracer in the central airways. Perfusion: No wedge shaped peripheral perfusion defects to suggest acute pulmonary embolism. IMPRESSION: No evidence of pulmonary embolus. Mild clumping of the inhaled radiotracer, suggestive of an element of COPD. Electronically Signed   By: Fidela Salisbury M.D.   On: 02/25/2018 11:20   Vas [place] Lower Extremity Venous (dvt)  Result Date: 02/25/2018  Lower Venous Study Indications: Pain.  Risk Factors: Sepsis. Post op right subtalar and talonavicular fusion 02/22/18. Limitations: Pitting edema. Comparison Study: Prior negative study done 08/31/12, is available for                   comparison. Performing Technologist: Sharion Dove RVS  Examination Guidelines: A complete evaluation includes B-mode imaging, spectral Doppler, color Doppler, and power Doppler as needed of all accessible portions of each vessel. Bilateral testing is considered an integral part of a complete examination. Limited examinations for reoccurring indications may be performed as noted.  Right Venous Findings:  +---------+---------------+---------+-----------+----------+----------+          CompressibilityPhasicitySpontaneityPropertiesSummary    +---------+---------------+---------+-----------+----------+----------+ CFV      Full           Yes      Yes                             +---------+---------------+---------+-----------+----------+----------+ SFJ      Full                                                    +---------+---------------+---------+-----------+----------+----------+ FV Prox  Full                                                    +---------+---------------+---------+-----------+----------+----------+ FV Mid   Full                                                    +---------+---------------+---------+-----------+----------+----------+ FV Distal               Yes  Yes                  visualized +---------+---------------+---------+-----------+----------+----------+ PFV      Full                                                    +---------+---------------+---------+-----------+----------+----------+ POP                     Yes      Yes                             +---------+---------------+---------+-----------+----------+----------+ PTV      Full                                                    +---------+---------------+---------+-----------+----------+----------+ PERO     Full                                                    +---------+---------------+---------+-----------+----------+----------+  Left Venous Findings: +---------+---------------+---------+-----------+----------+----------+          CompressibilityPhasicitySpontaneityPropertiesSummary    +---------+---------------+---------+-----------+----------+----------+ CFV      Full           Yes      Yes                             +---------+---------------+---------+-----------+----------+----------+ SFJ      Full                                                     +---------+---------------+---------+-----------+----------+----------+ FV Prox  Full                                                    +---------+---------------+---------+-----------+----------+----------+ FV Mid   Full                                                    +---------+---------------+---------+-----------+----------+----------+ FV Distal               Yes      Yes                  visualized +---------+---------------+---------+-----------+----------+----------+ PFV      Full                                                    +---------+---------------+---------+-----------+----------+----------+ POP  Full           Yes      Yes                             +---------+---------------+---------+-----------+----------+----------+ PTV      Full                                                    +---------+---------------+---------+-----------+----------+----------+ PERO     Full                                                    +---------+---------------+---------+-----------+----------+----------+    Summary: Right: There is no evidence of deep vein thrombosis in the lower extremity. However, portions of this examination were limited- see technologist comments above. Left: There is no evidence of deep vein thrombosis in the lower extremity. However, portions of this examination were limited- see technologist comments above.  *See table(s) above for measurements and observations. Electronically signed by Servando Snare MD on 02/25/2018 at 1:51:18 PM.    Final         Scheduled Meds: . allopurinol  100 mg Oral BID  . clopidogrel  75 mg Oral Daily  . exemestane  25 mg Oral Daily  . gabapentin  300 mg Oral TID  . heparin injection (subcutaneous)  5,000 Units Subcutaneous Q8H  . insulin aspart  0-9 Units Subcutaneous TID WC  . insulin NPH Human  20 Units Subcutaneous Q breakfast  . insulin NPH Human  25 Units Subcutaneous QHS   . metoprolol succinate  25 mg Oral QPM  . metroNIDAZOLE  500 mg Oral Q8H  . pantoprazole  40 mg Oral QAC breakfast  . potassium chloride  40 mEq Oral Q4H  . [START ON 02/28/2018] potassium chloride SA  40 mEq Oral BID  . pramipexole  0.25 mg Oral QHS  . pramipexole  0.5 mg Oral Daily  . rosuvastatin  20 mg Oral Q supper  . senna  1 tablet Oral BID   Continuous Infusions: . sodium chloride 50 mL/hr at 02/27/18 0811  . ceFEPime (MAXIPIME) IV 1 g (02/26/18 2123)  . potassium PHOSPHATE IVPB (in mmol)       LOS: 2 days    Time spent: 40 minutes    Irine Seal, MD [facility] Pager [phone]  If 7PM-7AM, please contact night-coverage [email or web address] Password [facility] 02/27/2018, 9:23 AM

## 2018-02-27 NOTE — Consult Note (Signed)
   [facility] CM Inpatient Consult   02/27/2018  [name] Feb 21, 1941 [id number]    Patient is active with [facility] program. She is followed by [facility]. Please see chart review tab then encounters for patient outreach details.   Sent notification to inpatient RNCM to make aware [facility] is active.  Will continue to follow for disposition plans and progression.   Marthenia Rolling, MSN-Ed, RN,BSN [facility] Liaison [phone]

## 2018-02-27 NOTE — Progress Notes (Signed)
Pharmacy Antibiotic Note  GISSELLA [name] is a 77 y.o. female admitted on 02/24/2018 with cellulitis and possible sepsis.  WBC now WNL, Cr trending down to 1.98 (CrCl ~25 mL/min). Afebrile. Vancomycin discontinued 12/2. Currently on day #4 of antibiotics. No growth on cx.  Plan: Cefepime 1 gm IV q24h Metronidazole 500 mg IV q8h Follow up plan for further de-escalation  Height: 5\' 6"  (167.6 cm) Weight: 191 lb 5.8 oz (86.8 kg) IBW/kg (Calculated) : 59.3  Temp (24hrs), Avg:99 F (37.2 C), Min:98.3 F (36.8 C), Max:100.4 F (38 C)  Recent Labs  Lab 02/24/18 1457 02/24/18 1510 02/24/18 2112 02/24/18 2346 02/25/18 0212 02/25/18 0725 02/25/18 1704 02/26/18 0236 02/27/18 0205  WBC 13.4*  --   --   --  9.0 10.4  --  8.6 9.2  CREATININE 2.92*  --   --   --  2.55* 2.48* 2.33* 2.19* 1.98*  LATICACIDVEN  --  1.21 1.1 0.8  --   --   --   --   --     Estimated Creatinine Clearance: 25.6 mL/min (A) (by C-G formula based on SCr of 1.98 mg/dL (H)).    Allergies  Allergen Reactions  . Ultram [Tramadol] Other (See Comments)    Pt has seizure activity with this medication  . Lipitor [Atorvastatin] Other (See Comments)    Bone and muscle pain  . Lyrica [Pregabalin] Other (See Comments)    Weight gain, extremity swelling  . Reglan [Metoclopramide] Other (See Comments)    Tardive dyskinesia; paradoxical reaction not relieved by benadryl  . Nsaids Swelling    SWELLING REACTION UNSPECIFIED     Antimicrobials this admission: Cefepime 11/29 >> Vanc 11/29 >> 12/1 Flagyl 11/29 >>  Dose adjustments this admission: N/A  Microbiology results: 11/29 Bcx: Ngtd 11/30 Bcx: NG 11/29 mrsa: negative  [name], PharmD, BCCCP Clinical Pharmacist  Pager: [phone] Phone: [phone] 02/27/2018 1:23 PM

## 2018-02-28 ENCOUNTER — Inpatient Hospital Stay (HOSPITAL_COMMUNITY): Payer: Medicare Other

## 2018-02-28 DIAGNOSIS — R0602 Shortness of breath: Secondary | ICD-10-CM

## 2018-02-28 DIAGNOSIS — I5032 Chronic diastolic (congestive) heart failure: Secondary | ICD-10-CM | POA: Diagnosis not present

## 2018-02-28 DIAGNOSIS — I5033 Acute on chronic diastolic (congestive) heart failure: Secondary | ICD-10-CM

## 2018-02-28 DIAGNOSIS — I959 Hypotension, unspecified: Secondary | ICD-10-CM

## 2018-02-28 HISTORY — DX: Chronic diastolic (congestive) heart failure: I50.32

## 2018-02-28 LAB — GLUCOSE, CAPILLARY
Glucose-Capillary: 86 mg/dL (ref 70–99)
Glucose-Capillary: 174 mg/dL — ABNORMAL HIGH (ref 70–99)
Glucose-Capillary: 173 mg/dL — ABNORMAL HIGH (ref 70–99)
Glucose-Capillary: 129 mg/dL — ABNORMAL HIGH (ref 70–99)

## 2018-02-28 LAB — BASIC METABOLIC PANEL
Anion gap: 8 (ref 5–15)
BUN: 23 mg/dL (ref 8–23)
CO2: 28 mmol/L (ref 22–32)
Calcium: 8.9 mg/dL (ref 8.9–10.3)
Chloride: 101 mmol/L (ref 98–111)
Creatinine, Ser: 1.8 mg/dL — ABNORMAL HIGH (ref 0.44–1.00)
GFR calc Af Amer: 30 mL/min — ABNORMAL LOW (ref >60)
GFR calc non Af Amer: 26 mL/min — ABNORMAL LOW (ref >60)
Glucose, Bld: 108 mg/dL — ABNORMAL HIGH (ref 70–99)
Potassium: 3.2 mmol/L — ABNORMAL LOW (ref 3.5–5.1)
Sodium: 137 mmol/L (ref 135–145)

## 2018-02-28 LAB — MAGNESIUM: Magnesium: 2.2 mg/dL (ref 1.7–2.4)

## 2018-02-28 LAB — PHOSPHORUS: Phosphorus: 2.8 mg/dL (ref 2.5–4.6)

## 2018-02-28 MED ORDER — SODIUM CHLORIDE 0.9 % IV BOLUS: 500.0000 mL | Freq: Once | INTRAVENOUS | Status: DC

## 2018-02-28 MED ORDER — MIDODRINE HCL 5 MG PO TABS
5.0000 mg | ORAL_TABLET | Freq: Three times a day (TID) | ORAL | Status: DC
  Administered 2018-02-28 – 2018-03-02 (×8): 5 mg via ORAL
  Filled 2018-02-28 (×8): qty 1

## 2018-02-28 MED ORDER — METOPROLOL SUCCINATE ER 25 MG PO TB24
12.5000 mg | ORAL_TABLET | Freq: Every evening | ORAL | Status: DC
  Administered 2018-02-28 – 2018-03-02 (×3): 12.5 mg via ORAL
  Filled 2018-02-28 (×3): qty 1

## 2018-02-28 MED ORDER — POTASSIUM CHLORIDE CRYS ER 20 MEQ PO TBCR
40.0000 meq | EXTENDED_RELEASE_TABLET | Freq: Once | ORAL | Status: AC
  Administered 2018-02-28: 40 meq via ORAL
  Filled 2018-02-28: qty 2

## 2018-02-28 NOTE — Progress Notes (Signed)
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 PMH includes chronic CHF, CAD, DM, breast ca, CKD, dementia, HTN, neuropathy.,     PT Comments    Patient seen for activity progression. Continues to require increased physical assist for all aspects of mobility and at this time patient is requiring 2 persons assist and remains limited by weakness and fatigue. Given current status, feel as though patient would be better suited for post acute rehabilitation at SNF to maximize potential for recovery and facilitate safety with mobility.  Patient in agreement.  Follow Up Recommendations  SNF;Supervision for mobility/OOB     Equipment Recommendations  None recommended by PT    Recommendations for Other Services       Precautions / Restrictions Precautions Precautions: Fall Required Braces or Orthoses: Other Brace Other Brace: CAM boot RLE--now that they have wrapped her legs it is better if she only has it on for transfers (tight) Restrictions Weight Bearing Restrictions: Yes RLE Weight Bearing: Non weight bearing    Mobility  Bed Mobility Overal bed mobility: Needs Assistance Bed Mobility: Supine to Sit;Sit to Supine     Supine to sit: Min guard;HOB elevated Sit to supine: Min assist   General bed mobility comments: increased time and effort to perform. min assist to elevate LEs back to bed and reposition  Transfers Overall transfer level: Needs assistance Equipment used: Rolling walker (2 wheeled);None Transfers: Sit to/from Stand Sit to Stand: Mod assist;+2 physical assistance         General transfer comment: +2 moderate assist to power up to standing with use of gait belt, increased time and effort with cues for positioning when coming to upright.  Assist to stabilize RW in standing with posterior bias from patient  Ambulation/Gait             General Gait Details: Deferred   Stairs             Wheelchair Mobility    Modified Rankin (Stroke Patients Only)       Balance Overall balance assessment: Needs assistance;History of Falls Sitting-balance support: Feet supported;No upper extremity supported Sitting balance-Leahy Scale: Good     Standing balance support: During functional activity;Bilateral upper extremity supported Standing balance-Leahy Scale: Poor Standing balance comment: Requires UE and external support for standing balance due to NWB RLE.                            Cognition Arousal/Alertness: Awake/alert Behavior During Therapy: WFL for tasks assessed/performed Overall Cognitive Status: Within Functional Limits for tasks assessed Area of Impairment: Safety/judgement;Memory                     Memory: Decreased short-term memory   Safety/Judgement: Decreased awareness of safety     General Comments: patient pleasent and eager to progerss today      Exercises General Exercises - Lower Extremity Quad Sets: Right;10 reps;Supine Long Arc Quad: AROM;Both;5 reps    General Comments        Pertinent Vitals/Pain Pain Assessment: 0-10 Faces Pain Scale: Hurts even more Pain Location: RLE Pain Descriptors / Indicators: Sore;Aching;Throbbing(dependent position) Pain Intervention(s): Monitored during session    Home Living  Prior Function            PT Goals (current goals can now be found in the care plan section) Acute Rehab PT Goals Patient Stated Goal: to go to rehab then home PT Goal Formulation: With patient/family Time For Goal Achievement: 03/09/18 Potential to Achieve Goals: Good Progress towards PT goals: Progressing toward goals    Frequency    Min 2X/week      PT Plan Discharge plan needs to be updated     Co-evaluation              AM-PAC PT "6 Clicks" Mobility   Outcome Measure  Help needed turning from your back to your side while in a flat bed without using bedrails?: None Help needed moving from lying on your back to sitting on the side of a flat bed without using bedrails?: A Little Help needed moving to and from a bed to a chair (including a wheelchair)?: A Little Help needed standing up from a chair using your arms (e.g., wheelchair or bedside chair)?: A Lot Help needed to walk in hospital room?: Total Help needed climbing 3-5 steps with a railing? : Total 6 Click Score: 14    End of Session Equipment Utilized During Treatment: Gait belt Activity Tolerance: Patient tolerated treatment well Patient left: in bed;with call bell/phone within reach;with family/visitor present Nurse Communication: Mobility status PT Visit Diagnosis: Pain;Muscle weakness (generalized) (M62.81);Difficulty in walking, not elsewhere classified (R26.2);History of falling (Z91.81) Pain - Right/Left: Right Pain - part of body: Ankle and joints of foot     Time: 9924-2683 PT Time Calculation (min) (ACUTE ONLY): 20 min  Charges:  $Therapeutic Activity: 8-22 mins                     Alben Deeds, PT DPT  Board Certified Neurologic Specialist [place] Pager [phone] Office [place] 02/28/2018, 3:52 PM

## 2018-02-28 NOTE — Progress Notes (Signed)
Shift event note: Notified by RN at approx 0215 regarding pt c/o feeling SOB like she can't catch her breath (w/o overt resp distress). Though somewhat tachypnic at times 02 sats remain WNL but RN reports faint crackles in lower bases. Orders placed for stat PCXR. Findings c/w mild vascular congestion. At bedside sometime later pt noted resting w/ eyes closed. She awakens easily and is oriented x 3. Skin w/d. She reports feeling of SOB resolved. Believes it could have been anxiety which she has experienced in the past. Discussed findings on her xray and hesitancy to treat w/ her diuretics d/t low BP (which has improved from 90's/40-50's to 110/68. HR in the 80's, and regular, RR 15-24 and 02 sats remain 96-100% 0n 2L [place]. Currently BBS CTA. Long discussion w/ pt and spouse regarding her feelings that she needs diuretics and hopes that her "kidney doctor" and Dr Grandville Silos will discuss this issue today. Pt and spouse are very knowledgeable regarding her health issues and medications. They relate how pleased they have been with her care since admission.  Assessment/Plan: 1. Tachypnea w/o hypoxia: CXR findings c/w mild pulmonary edema. Perhaps a component of anxiety as pt has a hx. Pt appears quite comfortable now and is not currently tachypnic. VSS and BP improving. Will defer use of diuretics at this time as plan per MD note is to restart her Bumex today. Pt and spouse very appreciative of findings and update. Will continue to monitor closely on SDU.  Belenda Cruise P.Rajon Bisig, NP-C [facility] Pager [phone]  CRITICAL CARE Performed by: Jeryl Columbia   Total critical care time: 60 minutes  Critical care time was exclusive of separately billable procedures and treating other patients.  Critical care was necessary to treat or prevent imminent or life-threatening deterioration.  Critical care was time spent personally by me on the following activities: development of treatment plan with patient  and/or surrogate as well as nursing, discussions with consultants, evaluation of patient's response to treatment, examination of patient, obtaining history from patient or surrogate, ordering and performing treatments and interventions, ordering and review of laboratory studies, ordering and review of radiographic studies, pulse oximetry and re-evaluation of patient's condition.

## 2018-02-28 NOTE — Progress Notes (Addendum)
PROGRESS NOTE    [name]  FMB:[id number] DOB: Sep 07, 1940 DOA: 02/24/2018 PCP: Redmond School, MD    Brief Narrative:  Patient is a pleasant 77 year old female history of chronic kidney disease stage IV, gout, type 2 diabetes, history of breast cancer in remission on oral chemotherapy, prior history of CVA/TIA, peripheral neuropathy status post recent subtalar, talonavicular fusion of right lower extremity on 02/22/2018 who was discharged home on 02/23/2018 presented back to the ED on 02/24/2018 with subjective fevers, severe right foot pain, decreased oral intake.  Patient noted on admission to have sepsis physiology with temp of 100.8, tachycardia, hypotension.  Patient also with severe hypokalemia, acute on chronic kidney disease stage IV, hypomagnesemia and some hypoxia noted.  Patient admitted to stepdown unit, blood cultures obtained, orthopedic consulted and patient placed empirically on IV antibiotics.   Assessment & Plan:   Principal Problem:   Sepsis ([place]) Active Problems:   Acute on chronic diastolic CHF (congestive heart failure) (HCC)   Hypokalemia   Hypomagnesemia   CAD S/P percutaneous coronary angioplasty   Essential hypertension   Dyslipidemia, goal LDL below 70   Dementia (HCC)   GERD (gastroesophageal reflux disease)   CKD (chronic kidney disease) stage 4, GFR 15-29 ml/min (HCC)   Acute kidney injury superimposed on CKD ([place])   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 (HCC)   Malignant 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, estrogen receptor positive ([place])   Malignant neoplasm of upper-outer quadrant of left breast in female, estrogen receptor positive (HCC)   Chronic diastolic heart failure (HCC)   Urinary retention   Elevated troponin   SOB (shortness of breath)   Hypotension  #1 rule out sepsis Patient presented with subjective fevers, significant right foot pain post recent surgery 02/22/2018, tachycardia and  hypotension.  Patient noted to have systolic blood pressure drop as low as 68/40.  Blood cultures obtained with no growth to date.  Chest x-[place] negative for any acute infiltrate.  Urinalysis unremarkable.  Urine cultures negative. VQ scan negative for PE.  Lower extremity Dopplers negative. Patient seen in consultation by orthopedics who have a very low suspicion for a foot infection.  Saline lock IV fluids.  Continue incentive spirometry.  Continue empiric IV cefepime and Flagyl.  Discontinued IV vancomycin.  If patient remains afebrile tomorrow and cultures remain negative could likely transition to oral antibiotics and treat empirically for 5 to 7 days.   2.  Hypotension Questionable etiology.  May be secondary to volume depletion in the setting of diuretics of Bumex and metolazone versus infectious etiology.  Patient states has had times at home where his systolic blood pressures has been in the 60s and has felt symptomatic with dizziness and the feeling that she was leaving.  Chest x-[place] negative for any acute infiltrate.  Urinalysis unremarkable.  Blood cultures pending with no growth today.  Urine cultures negative. Patient has been assessed by orthopedics who have a low suspicion for foot infection at this time.  Blood pressure responded to IV fluids initially on admission.  Lower extremity Dopplers negative for DVT.  VQ scan negative for PE.  Random cortisol at 14.4.  TSH within normal limits at 2.908.  Continue empiric IV antibiotics of cefepime and Flagyl.  Discontinued IV vancomycin.  Patient noted to have a low-grade temp on 02/26/2018, however currently afebrile.  Systolic blood pressures now in the low 90s.  Patient now in volume overload and IV fluids were saline locked on 02/27/2018.  Systolic blood pressures in the 90s.  Patient to be started on Bumex today due to volume overload.  Will start patient on midodrine 5 mg p.o. 3 times daily.  Follow.   3.  Acute renal failure on chronic kidney  disease stage IV Patient with a baseline creatinine anywhere from 1.5-2.  Creatinine on admission was 2.92.  Urinalysis nitrite negative, trace leukocytes, negative protein.  Renal function slowly trending down with hydration.  Creatinine at 1.80 today 02/28/2018.  Good urine output 2.275 L over the past 24 hours.  IV fluids were saline locked on 02/27/2018 as patient was developing lower extremity edema.  Continue to hold metolazone.  Resume Bumex 2 mg p.o. twice daily today 02/28/2018.  Discussed with patient's nephrologist [name].   4.  Urinary retention Patient with a history of urinary retention.  Patient states she does self catheterizations at home as needed.  Continue I and O cath every 6 hours as needed.  Follow.  5.  Elevated troponin Troponins minimally elevated and slowly trending down.  Likely secondary to problem #1.  Patient denies any overt chest pain.  VQ scan negative for PE.  EKG on admission with a sinus tachycardia with no ischemic changes noted.  2D echo with LVH, EF 65 to 70%, no wall motion abnormalities, grade 1 diastolic dysfunction.  Highly doubt if this is ACS.  Will monitor for now.  6.  Hypokalemia/hypomagnesemia Patient with severe hypokalemia on admission with a potassium level of 2.2.  Magnesium of 1.6.  Likely secondary to diuretics of Bumex and metolazone.  Potassium this morning is 3.2.  Magnesium is 2.2.  Will resume patient's home regimen of potassium supplementation today.  Give extra dose of potassium 40 mEq p.o. x1.  Patient to be started on Bumex today.  7.  Dehydration Saline lock IV fluids.  Follow.  8.  Hypertension Patient noted to be hypotensive.  Patient's antihypertensive medications were held on admission.  Patient now with volume overload.  Patient with systolic blood pressure of 90.  IV fluids have been saline locked.  Patient will be started on Bumex 2 mg p.o. twice daily due to volume overload as patient is symptomatic.  Decrease Toprol-XL to 12.5  mg daily.  Start midodrine 5 mg 3 times daily due to low blood pressure.   9.  Dyslipidemia Continue statin.  10.  Acute on chronic diastolic heart failure Patient was on the dry side on admission.  Patient noted to be hypotensive on presentation with blood pressure dropping as low as 80/47.  Patient now with lower extremity edema and bibasilar crackles with episode of shortness of breath overnight.  Chest x-Ciancio consistent with vascular congestion.  IV fluids were saline locked on 02/27/2018.  Will start patient on Bumex 2 mg p.o. twice daily today 02/28/2018.  Start patient on midodrine secondary to hypotension in order to be able to diurese patient adequately.  Discussed with patient's nephrologist, [name].   11.  Dementia Stable.  Patient alert and oriented answering questions appropriately.  Monitor for sundowning.  12.  Gastroesophageal reflux disease Continue PPI.   13.  Type 2 diabetes mellitus with complications Hemoglobin A1c 7.1.  Continue carb modified diet.  CBG of 86 this morning. Continue home regimen of NPH 20 units in the morning and 25 units at bedtime.  Continue sliding scale insulin.   14.  History of gout Continue allopurinol.    15.  History of coronary artery disease Continue Plavix.  Plavix held on  admission due to concern of possible surgical intervention.  Plavix has been resumed.   16.  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 Sharol Given Patient has been seen in consultation by orthopedics who has a low suspicion of a foot infection.  Per orthopedics nonweightbearing right lower extremity.  Up with PT when able.  Per orthopedics.  17.  History of breast cancer Continue Aromasin.  Outpatient follow-up with oncology.  18.  Lower extremity edema Patient now with increasing lower extremity edema.  IV fluids were saline locked on 02/27/2018.  Patient was on Bumex and  metolazone prior to admission however due to hypotension Bumex and metolazone was held.  Case discussed with patient's nephrologist, [name] and IV fluids were discontinued 02/27/2018.  Will start patient on Bumex 2 mg p.o. twice daily today 02/28/2018.  Will place on midodrine due to hypotension.  Orthopedics has ordered Profore leg wraps.  Patient requires inpatient admission as patient presented with concern for sepsis physiology with fevers, tachycardia, hypotension with systolic blood pressure of 68/40 requiring ongoing IV fluid resuscitation as well as ongoing empiric IV antibiotics for concern for sepsis.  Patient also with severe hypokalemia and at increased risk for arrhythmias and deterioration requiring replacement.  Patient now in acute on chronic CHF exacerbation with systolic blood pressures in the 90s.  DVT prophylaxis: Heparin. Code Status: Full Family Communication: Updated patient and daughter at bedside. Disposition Plan: Skilled nursing facility when medically improved and clinically stable.    Consultants:   Orthopedics: Dr. Erlinda Hong 02/25/2018/DrSharol Given 02/25/2018  Curbside nephrology: [name]  Procedures:   Chest x-[place] 02/24/2018  VQ scan 02/25/2018  Lower extremity Dopplers 02/25/2018  2D echo 02/25/2018  Antimicrobials:  IV cefepime 02/24/2018  IV Flagyl 02/24/2018  IV vancomycin 02/24/2018>>>>>> 02/27/2018   Subjective: Patient noted to have a bout of shortness of breath overnight.  Noted to have systolic blood pressures in the 90s.  Chest x-[place]jo consistent with vascular congestion.  Patient sitting on bedside commode states she is feeling better this morning and feels part of her shortness of breath may have been related to anxiety.  Patient with some complaints of dizziness and lightheadedness.  Afebrile.  Objective: Vitals:   02/28/18 0100 02/28/18 0238 02/28/18 0400 02/28/18 0909  BP: (!) 99/57  107/62 (!) 90/53  Pulse: 85  85   Resp: (!) 22  (!) 33     Temp: 99.6 F (37.6 C) 98.7 F (37.1 C) 98.8 F (37.1 C) 98.5 F (36.9 C)  TempSrc: Oral Oral Oral Oral  SpO2: 96%  96%   Weight:      Height:        Intake/Output Summary (Last 24 hours) at 02/28/2018 0947 Last data filed at 02/28/2018 0700 Gross per 24 hour  Intake 100 ml  Output 2275 ml  Net -2175 ml   Filed Weights   02/24/18 2105  Weight: 86.8 kg    Examination:  General exam: NAD. Respiratory system: Diffuse crackles.  No rhonchi.  No wheezing.  Normal respiratory effort.  Speaking in full sentences. Cardiovascular system: RRR no murmurs rubs or gallops.  No JVD.  2+ bilateral lower extremity edema right greater than left.  Right foot in boot.  Bilateral legs are wrapped in profore.  Gastrointestinal system: Abdomen is soft, nontender, nondistended, positive bowel sounds.  No rebound.  No guarding.   Central nervous system: Alert and oriented. No focal neurological deficits. Extremities: 2+ bilateral lower  extremity edema right greater than left.  Right foot wrapped in bandage.  Bilateral legs wrapped in Profore.  Skin: No rashes, lesions or ulcers Psychiatry: Judgement and insight appear normal. Mood & affect appropriate.     Data Reviewed: I have personally reviewed following labs and imaging studies  CBC: Recent Labs  Lab 02/24/18 1457 02/25/18 0212 02/25/18 0725 02/26/18 0236 02/27/18 0205  WBC 13.4* 9.0 10.4 8.6 9.2  NEUTROABS 10.5* 6.8  --  6.3 6.9  HGB 11.5* 9.5* 11.0* 9.7* 9.3*  HCT 36.0 30.2* 35.1* 31.9* 29.6*  MCV 94.2 94.1 93.6 95.2 94.0  PLT 182 163 166 196 017   Basic Metabolic Panel: Recent Labs  Lab 02/24/18 1958  02/25/18 0725 02/25/18 1704 02/26/18 0236 02/27/18 0205 02/28/18 0300  NA  --    < > 139 138 138 138 137  K  --    < > 2.5* 2.9* 3.4* 2.8* 3.2*  CL  --    < > 96* 98 102 101 101  CO2  --    < > 28 29 26  32 28  GLUCOSE  --    < > 123* 182* 138* 155* 108*  BUN  --    < > 42* 38* 36* 28* 23  CREATININE  --    < > 2.48*  2.33* 2.19* 1.98* 1.80*  CALCIUM  --    < > 9.1 8.9 8.8* 8.6* 8.9  MG 1.6*  --  1.8  --  2.2  --  2.2  PHOS  --   --  3.0  --   --  1.7* 2.8   < > = values in this interval not displayed.   GFR: Estimated Creatinine Clearance: 28.1 mL/min (A) (by C-G formula based on SCr of 1.8 mg/dL (H)). Liver Function Tests: Recent Labs  Lab 02/24/18 1457 02/25/18 0725 02/27/18 0205  AST 41 35  --   ALT 10 10  --   ALKPHOS 74 65  --   BILITOT 0.6 0.6  --   PROT 6.6 6.0*  --   ALBUMIN 3.1* 2.7* 2.3*   No results for input(s): LIPASE, AMYLASE in the last 168 hours. No results for input(s): AMMONIA in the last 168 hours. Coagulation Profile: Recent Labs  Lab 02/24/18 2112  INR 1.28   Cardiac Enzymes: Recent Labs  Lab 02/24/18 2027 02/25/18 0212 02/25/18 0725  TROPONINI 0.05* 0.04* 0.03*   BNP (last 3 results) No results for input(s): PROBNP in the last 8760 hours. HbA1C: No results for input(s): HGBA1C in the last 72 hours. CBG: Recent Labs  Lab 02/27/18 1200 02/27/18 1751 02/27/18 1930 02/27/18 2122 02/28/18 0808  GLUCAP 189* 147* 139* 143* 86   Lipid Profile: No results for input(s): CHOL, HDL, LDLCALC, TRIG, CHOLHDL, LDLDIRECT in the last 72 hours. Thyroid Function Tests: No results for input(s): TSH, T4TOTAL, FREET4, T3FREE, THYROIDAB in the last 72 hours. Anemia Panel: No results for input(s): VITAMINB12, FOLATE, FERRITIN, TIBC, IRON, RETICCTPCT in the last 72 hours. Sepsis Labs: Recent Labs  Lab 02/24/18 1510 02/24/18 2112 02/24/18 2346  PROCALCITON  --  1.33  --   LATICACIDVEN 1.21 1.1 0.8    Recent Results (from the past 240 hour(s))  MRSA PCR Screening     Status: None   Collection Time: 02/24/18  9:06 PM  Result Value Ref Range Status   MRSA by PCR NEGATIVE NEGATIVE Final    Comment:        The GeneXpert MRSA Assay (FDA approved  for NASAL specimens only), is one component of a comprehensive MRSA colonization surveillance program. It is  not intended to diagnose MRSA infection nor to guide or monitor treatment for MRSA infections. Performed at [facility], [place] [address], [place], [place] [address]   Culture, blood (x 2)     Status: None (Preliminary result)   Collection Time: 02/24/18  9:10 PM  Result Value Ref Range Status   Specimen Description BLOOD RIGHT HAND  Final   Special Requests   Final    BOTTLES DRAWN AEROBIC AND ANAEROBIC Blood Culture results may not be optimal due to an excessive volume of blood received in culture bottles   Culture   Final    NO GROWTH 3 DAYS Performed at [facility], [place] [address], [place], [place] [address]    Report Status PENDING  Incomplete  Culture, blood (x 2)     Status: None (Preliminary result)   Collection Time: 02/24/18  9:21 PM  Result Value Ref Range Status   Specimen Description BLOOD RIGHT HAND  Final   Special Requests   Final    BOTTLES DRAWN AEROBIC ONLY Blood Culture adequate volume   Culture   Final    NO GROWTH 3 DAYS Performed at [facility], [place] [address], [place], [place] [address]    Report Status PENDING  Incomplete  Urine Culture     Status: None   Collection Time: 02/25/18  3:15 PM  Result Value Ref Range Status   Specimen Description URINE, RANDOM  Final   Special Requests NONE  Final   Culture   Final    NO GROWTH Performed at [facility], [address], [place], [place] [address]    Report Status 02/26/2018 FINAL  Final         Radiology Studies: Dg Chest Port 1 View  Result Date: 02/28/2018 CLINICAL DATA:  Shortness of breath EXAM: PORTABLE CHEST 1 VIEW COMPARISON:  02/24/2018 FINDINGS: Borderline heart size. Bibasilar opacities, likely atelectasis. Aortic atherosclerosis. Mild vascular congestion. No effusions or acute bony abnormality. IMPRESSION: Borderline heart size.  Mild vascular congestion. Bibasilar atelectasis. Electronically Signed   By: Rolm Baptise M.D.   On: 02/28/2018 02:37         Scheduled Meds: . allopurinol  100 mg Oral BID  . bumetanide  2 mg Oral BID  . clopidogrel  75 mg Oral Daily  . exemestane  25 mg Oral Daily  . gabapentin  300 mg Oral TID  . heparin injection (subcutaneous)  5,000 Units Subcutaneous Q8H  . insulin aspart  0-9 Units Subcutaneous TID WC  . insulin NPH Human  20 Units Subcutaneous Q breakfast  . insulin NPH Human  25 Units Subcutaneous QHS  . metoprolol succinate  12.5 mg Oral QPM  . metroNIDAZOLE  500 mg Oral Q8H  . midodrine  5 mg Oral TID WC  . pantoprazole  40 mg Oral QAC breakfast  . potassium chloride SA  40 mEq Oral BID  . potassium chloride  40 mEq Oral Once  . pramipexole  0.25 mg Oral QHS  . pramipexole  0.5 mg Oral Daily  . rosuvastatin  20 mg Oral Q supper  . senna  1 tablet Oral BID   Continuous Infusions: . ceFEPime (MAXIPIME) IV Stopped (02/27/18 2359)     LOS: 3 days    Time spent: 40 minutes    Irine Seal, MD [facility] Pager [phone]  If 7PM-7AM, please  contact night-coverage [email or web address] Password [facility] 02/28/2018, 9:47 AM

## 2018-03-01 LAB — CBC WITH DIFFERENTIAL/PLATELET
Abs Immature Granulocytes: 0.05 10*3/uL (ref 0.00–0.07)
Basophils Absolute: 0.1 10*3/uL (ref 0.0–0.1)
Basophils Relative: 1 %
Eosinophils Absolute: 0.4 10*3/uL (ref 0.0–0.5)
Eosinophils Relative: 4 %
HCT: 30.1 % — ABNORMAL LOW (ref 36.0–46.0)
Hemoglobin: 9.6 g/dL — ABNORMAL LOW (ref 12.0–15.0)
Immature Granulocytes: 1 %
Lymphocytes Relative: 14 %
Lymphs Abs: 1.4 10*3/uL (ref 0.7–4.0)
MCH: 29.9 pg (ref 26.0–34.0)
MCHC: 31.9 g/dL (ref 30.0–36.0)
MCV: 93.8 fL (ref 80.0–100.0)
Monocytes Absolute: 0.7 10*3/uL (ref 0.1–1.0)
Monocytes Relative: 7 %
Neutro Abs: 7.6 10*3/uL (ref 1.7–7.7)
Neutrophils Relative %: 73 %
Platelets: 248 10*3/uL (ref 150–400)
RBC: 3.21 MIL/uL — ABNORMAL LOW (ref 3.87–5.11)
RDW: 17.4 % — ABNORMAL HIGH (ref 11.5–15.5)
WBC: 10.1 10*3/uL (ref 4.0–10.5)
nRBC: 0 % (ref 0.0–0.2)

## 2018-03-01 LAB — CULTURE, BLOOD (ROUTINE X 2)
Culture: NO GROWTH
Culture: NO GROWTH
Special Requests: ADEQUATE

## 2018-03-01 LAB — GLUCOSE, CAPILLARY
Glucose-Capillary: 80 mg/dL (ref 70–99)
Glucose-Capillary: 77 mg/dL (ref 70–99)
Glucose-Capillary: 94 mg/dL (ref 70–99)
Glucose-Capillary: 141 mg/dL — ABNORMAL HIGH (ref 70–99)

## 2018-03-01 LAB — RENAL FUNCTION PANEL
Albumin: 2.3 g/dL — ABNORMAL LOW (ref 3.5–5.0)
Anion gap: 11 (ref 5–15)
BUN: 23 mg/dL (ref 8–23)
CO2: 27 mmol/L (ref 22–32)
Calcium: 9.2 mg/dL (ref 8.9–10.3)
Chloride: 102 mmol/L (ref 98–111)
Creatinine, Ser: 1.9 mg/dL — ABNORMAL HIGH (ref 0.44–1.00)
GFR calc Af Amer: 29 mL/min — ABNORMAL LOW (ref >60)
GFR calc non Af Amer: 25 mL/min — ABNORMAL LOW (ref >60)
Glucose, Bld: 80 mg/dL (ref 70–99)
Phosphorus: 2.7 mg/dL (ref 2.5–4.6)
Potassium: 4 mmol/L (ref 3.5–5.1)
Sodium: 140 mmol/L (ref 135–145)

## 2018-03-01 MED ORDER — CEPHALEXIN 250 MG PO CAPS
250.0000 mg | ORAL_CAPSULE | Freq: Three times a day (TID) | ORAL | Status: DC
  Administered 2018-03-01 – 2018-03-02 (×4): 250 mg via ORAL
  Filled 2018-03-01 (×5): qty 1

## 2018-03-01 NOTE — NC FL2 (Signed)
[place] LEVEL OF CARE SCREENING TOOL     IDENTIFICATION  Patient Name: [name] Birthdate: Feb 08, 1941 Sex: female Admission Date (Current Location): 02/24/2018  [facility] and [place] Number:  Herbalist and Address:  [facility] [place], [place] [address] [place], [place] [address]      Provider Number: [id number]  Attending Physician Name and Address:  Dessa Phi, DO  Relative Name and Phone Number:       Current Level of Care: Hospital Recommended Level of Care: [place] Prior Approval Number:    Date Approved/Denied:   PASRR Number: Manual Review  Discharge Plan: SNF    Current Diagnoses: Patient Active Problem List   Diagnosis Date Noted  . Acute on chronic diastolic CHF (congestive heart failure) ([place]) 02/28/2018  . SOB (shortness of breath)   . Hypotension   . Elevated troponin 02/25/2018  . Hypomagnesemia 02/25/2018  . Hypokalemia 02/24/2018  . Urinary retention 02/24/2018  . Sepsis ([place]) 02/24/2018  . Posterior tibial tendinitis of right leg 02/22/2018  . Posterior tibial tendinitis, right leg   . Right atrial mass   . Chronic diastolic heart failure ([place]) 08/01/2017  . Genetic testing 12/03/2016  . 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 Malignant neoplasm of upper-outer quadrant of left breast in female, estrogen receptor positive ([place]) 11/19/2016  . Chronic idiopathic gout involving toe of left foot without tophus 11/15/2016  . Malignant 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, estrogen receptor positive ([place]) 11/09/2016  . Tendon tear, ankle, left, sequela   . Traumatic rupture of left anterior tibial tendon 04/20/2016  . Acute kidney injury superimposed on CKD ([place])   . 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 ([place])   . Headache, migraine   . Unexplained weight loss 11/17/2015  . 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  Restless legs syndrome 07/24/2015  . Mild cognitive impairment 01/08/2015  . Abnormality of gait 01/08/2015  . Dizziness 12/19/2014  . Preoperative cardiovascular examination 12/11/2013  . GERD (gastroesophageal reflux disease) 10/06/2013  . Syncope 10/05/2013  . Dementia ([place]) 12/06/2012  . Anxiety 10/28/2012    Class: Acute  . Bilateral lower extremity edema 10/28/2012  . CAD S/P percutaneous coronary angioplasty   . Essential hypertension   . Dyslipidemia, goal LDL below 70     Orientation RESPIRATION BLADDER Height & Weight     Self, Time, Situation, Place  Normal Continent, Indwelling catheter Weight: 191 lb 5.8 oz (86.8 kg) Height:  5\' 6"  (167.6 cm)  BEHAVIORAL SYMPTOMS/MOOD NEUROLOGICAL BOWEL NUTRITION STATUS  (None) (Dementia) Continent Diet(Carb modified)  AMBULATORY STATUS COMMUNICATION OF NEEDS Skin   Extensive Assist Verbally Surgical wounds                       Personal Care Assistance Level of Assistance  Bathing, Feeding, Dressing Bathing Assistance: Limited assistance Feeding assistance: Independent Dressing Assistance: Limited assistance     Functional Limitations Info  Sight, Hearing, Speech Sight Info: Adequate Hearing Info: Adequate Speech Info: Adequate    SPECIAL CARE FACTORS FREQUENCY  PT (By licensed PT), Blood pressure, OT (By licensed OT)     PT Frequency: 5 x week OT Frequency: 5 x week            Contractures Contractures Info: Not present    Additional Factors Info  Code Status, Allergies Code Status Info: Full Allergies Info: Ultram (Tramadol), Lipitor (  Atorvastatin), Lyrica (Pregabalin), Reglan (Metoclopramide), Nsaids.           Current Medications (03/01/2018):  This is the current hospital active medication list Current Facility-Administered Medications  Medication Dose Route Frequency Provider Last Rate Last Dose  . acetaminophen (TYLENOL) tablet 650 mg  650 mg Oral Q6H PRN Toy Baker, MD   650 mg at  02/27/18 1302   Or  . acetaminophen (TYLENOL) suppository 650 mg  650 mg Rectal Q6H PRN Doutova, Anastassia, MD      . allopurinol (ZYLOPRIM) tablet 100 mg  100 mg Oral BID Doutova, Anastassia, MD   100 mg at 03/01/18 1145  . bumetanide (BUMEX) tablet 2 mg  2 mg Oral BID Eugenie Filler, MD   2 mg at 02/28/18 1659  . cephALEXin (KEFLEX) capsule 250 mg  250 mg Oral Q8H Dessa Phi, DO      . clopidogrel (PLAVIX) tablet 75 mg  75 mg Oral Daily Eugenie Filler, MD   75 mg at 03/01/18 1144  . diazepam (VALIUM) tablet 5 mg  5 mg Oral BID PRN Eugenie Filler, MD      . exemestane (AROMASIN) tablet 25 mg  25 mg Oral Daily Toy Baker, MD   25 mg at 02/28/18 0912  . gabapentin (NEURONTIN) capsule 300 mg  300 mg Oral TID Toy Baker, MD   300 mg at 03/01/18 1144  . heparin injection 5,000 Units  5,000 Units Subcutaneous Q8H Eugenie Filler, MD   5,000 Units at 03/01/18 0556  . insulin aspart (novoLOG) injection 0-9 Units  0-9 Units Subcutaneous TID WC Eugenie Filler, MD   2 Units at 02/28/18 1753  . insulin NPH Human (HUMULIN N,NOVOLIN N) injection 20 Units  20 Units Subcutaneous Q breakfast Toy Baker, MD   20 Units at 03/01/18 0809  . insulin NPH Human (HUMULIN N,NOVOLIN N) injection 25 Units  25 Units Subcutaneous QHS Toy Baker, MD   25 Units at 02/28/18 2157  . methocarbamol (ROBAXIN) tablet 500 mg  500 mg Oral Q6H PRN Eugenie Filler, MD      . metoprolol succinate (TOPROL-XL) 24 hr tablet 12.5 mg  12.5 mg Oral QPM Eugenie Filler, MD   12.5 mg at 02/28/18 1205  . metroNIDAZOLE (FLAGYL) tablet 500 mg  500 mg Oral Q8H Eugenie Filler, MD   500 mg at 03/01/18 0555  . midodrine (PROAMATINE) tablet 5 mg  5 mg Oral TID WC Eugenie Filler, MD   5 mg at 03/01/18 1144  . ondansetron (ZOFRAN) tablet 4 mg  4 mg Oral Q6H PRN Doutova, Anastassia, MD       Or  . ondansetron (ZOFRAN) injection 4 mg  4 mg Intravenous Q6H PRN Doutova, Anastassia, MD       . oxyCODONE-acetaminophen (PERCOCET/ROXICET) 5-325 MG per tablet 1 tablet  1 tablet Oral Q4H PRN Eugenie Filler, MD   1 tablet at 03/01/18 0558  . pantoprazole (PROTONIX) EC tablet 40 mg  40 mg Oral QAC breakfast Toy Baker, MD   40 mg at 03/01/18 0809  . potassium chloride SA (K-DUR,KLOR-CON) CR tablet 40 mEq  40 mEq Oral BID Eugenie Filler, MD   40 mEq at 03/01/18 1145  . pramipexole (MIRAPEX) tablet 0.25 mg  0.25 mg Oral QHS Doutova, Anastassia, MD   0.25 mg at 02/28/18 2153  . pramipexole (MIRAPEX) tablet 0.5 mg  0.5 mg Oral Daily Doutova, Anastassia, MD   0.5 mg at 02/28/18 1701  .  rosuvastatin (CRESTOR) tablet 20 mg  20 mg Oral Q supper Toy Baker, MD   20 mg at 02/28/18 1700  . senna (SENOKOT) tablet 8.6 mg  1 tablet Oral BID Toy Baker, MD   8.6 mg at 03/01/18 1144     Discharge Medications: Please see discharge summary for a list of discharge medications.  Relevant Imaging Results:  Relevant Lab Results:   Additional Information SS#: [id number]  Candie Chroman, LCSW

## 2018-03-01 NOTE — Clinical Social Work Note (Signed)
[facility] is able to offer patient a bed. Daughter has been notified and will update patient and patient's husband. Uploaded requested documents into [place] [facility] for PASARR review.  Dayton Scrape, [place]

## 2018-03-01 NOTE — Consult Note (Signed)
   [facility] CM Inpatient Consult   03/01/2018  [name] Jul 17, 1940 [id number]    Spoke with Mrs. Stoneman at bedside. She is active with [place] Management program. She reports the plan will be for SNF at discharge. Discussed that writer will make [facility] LCSW referral for follow up while at SNF. She is agreeable to this.  Will update [facility] RNCM of disposition plans.   Marthenia Rolling, MSN-Ed, RN,BSN [facility] Liaison [phone]

## 2018-03-01 NOTE — Clinical Social Work Note (Signed)
Clinical Social Work Assessment  Patient Details  Name: [name] MRN: [id number] Date of Birth: Aug 12, 1940  Date of referral:  03/01/18               Reason for consult:  Facility Placement, Discharge Planning                Permission sought to share information with:  Facility Sport and exercise psychologist, Family Supports Permission granted to share information::  Yes, Verbal Permission Granted  Name::     Chief Executive Officer::  SNF's  Relationship::  Daughter  Contact Information:  [phone]  Housing/Transportation Living arrangements for the past 2 months:  Single Family Home Source of Information:  Patient, Medical Team, Adult Children Patient Interpreter Needed:  None Criminal Activity/Legal Involvement Pertinent to Current Situation/Hospitalization:  No - Comment as needed Significant Relationships:  Adult Children, Spouse Lives with:  Spouse Do you feel safe going back to the place where you live?  Yes Need for family participation in patient care:  Yes (Comment)  Care giving concerns:  PT recommending SNF once medically stable for discharge.   Social Worker assessment / plan:  CSW met with patient. Daughter, [name], at bedside. CSW introduced role and explained that PT recommendations would be discussed. Patient and her daughter agreeable to SNF placement. Reviewed SNF list. First preference is [facility], second preference is [facility], and third preference is [facility] (a friend had gotten rehab there in the past). Admissions coordinator will review referral and notify CSW of decision. PASARR under manual review. Patient cannot go to SNF until PASARR obtained. No further concerns. CSW encouraged patient and her daughter to contact CSW as needed. CSW will continue to follow patient and her daughter for support and facilitate discharge to SNF once medically stable. Patient's daughter will update her father.  Employment status:  Retired Forensic scientist:   Medicare PT Recommendations:  [place] / Referral to community resources:  [place]  Patient/Family's Response to care:  Patient and her daughter agreeable to SNF placement. Patient's family supportive and involved in patient's care. Patient and her daughter appreciated social work intervention.  Patient/Family's Understanding of and Emotional Response to Diagnosis, Current Treatment, and Prognosis:  Patient and her daughter have a good understanding of the reason for admission and her need for rehab prior to returning home. Patient appears happy with hospital care.  Emotional Assessment Appearance:  Appears stated age Attitude/Demeanor/Rapport:  Engaged, Gracious Affect (typically observed):  Accepting, Appropriate, Calm, Pleasant Orientation:  Oriented to Self, Oriented to Place, Oriented to  Time, Oriented to Situation Alcohol / Substance use:  Never Used Psych involvement (Current and /or in the community):  No (Comment)  Discharge Needs  Concerns to be addressed:  Care Coordination Readmission within the last 30 days:  No Current discharge risk:  Dependent with Mobility Barriers to Discharge:  Awaiting State Approval ([place]), Continued Medical Work up   Candie Chroman, LCSW 03/01/2018, 12:29 PM

## 2018-03-01 NOTE — Plan of Care (Signed)
  Problem: Clinical Measurements: Goal: Will remain free from infection Outcome: Progressing Note:  Antibiotics given as ordered.

## 2018-03-01 NOTE — Clinical Social Work Placement (Signed)
   CLINICAL SOCIAL WORK PLACEMENT  NOTE  Date:  03/01/2018  Patient Details  Name: [name] MRN: [id number] Date of Birth: 10/17/1940  Clinical Social Work is seeking post-discharge placement for this patient at the [place] level of care (*CSW will initial, date and re-position this form in  chart as items are completed):      Patient/family provided with [place] Work Department's list of facilities offering this level of care within the geographic area requested by the patient (or if unable, by the patient's family).  Yes   Patient/family informed of their freedom to choose among providers that offer the needed level of care, that participate in Medicare, Medicaid or managed care program needed by the patient, have an available bed and are willing to accept the patient.      Patient/family informed of [facility]'s ownership interest in [facility] and [facility], as well as of the fact that they are under no obligation to receive care at these facilities.  PASRR submitted to EDS on 03/01/18     PASRR number received on       Existing PASRR number confirmed on       FL2 transmitted to all facilities in geographic area requested by pt/family on 03/01/18     FL2 transmitted to all facilities within larger geographic area on       Patient informed that his/her managed care company has contracts with or will negotiate with certain facilities, including the following:            Patient/family informed of bed offers received.  Patient chooses bed at       Physician recommends and patient chooses bed at      Patient to be transferred to   on  .  Patient to be transferred to facility by       Patient family notified on   of transfer.  Name of family member notified:        PHYSICIAN Please sign FL2     Additional Comment:    _______________________________________________ Candie Chroman, LCSW 03/01/2018, 12:33 PM

## 2018-03-01 NOTE — Plan of Care (Signed)
  Problem: Health Behavior/Discharge Planning: Goal: Ability to manage health-related needs will improve Outcome: Progressing   Problem: Clinical Measurements: Goal: Ability to maintain clinical measurements within normal limits will improve Outcome: Progressing Goal: Will remain free from infection Outcome: Progressing Goal: Diagnostic test results will improve Outcome: Progressing Goal: Respiratory complications will improve Outcome: Progressing Goal: Cardiovascular complication will be avoided Outcome: Progressing   Problem: Activity: Goal: Risk for activity intolerance will decrease Outcome: Progressing   Problem: Nutrition: Goal: Adequate nutrition will be maintained Outcome: Progressing   Problem: Coping: Goal: Level of anxiety will decrease Outcome: Progressing   Problem: Elimination: Goal: Will not experience complications related to bowel motility Outcome: Progressing Goal: Will not experience complications related to urinary retention Outcome: Progressing   Problem: Skin Integrity: Goal: Risk for impaired skin integrity will decrease Outcome: Progressing

## 2018-03-01 NOTE — Progress Notes (Signed)
PROGRESS NOTE    [name]  EQA:[id number] DOB: 08-25-1940 DOA: 02/24/2018 PCP: Redmond School, MD     Brief Narrative:  [name] is a 77 year-old female with history of chronic kidney disease stage IV, gout, type 2 diabetes, history of breast cancer in remission on oral chemotherapy, prior history of CVA/TIA, peripheral neuropathy status post recent subtalar, talonavicular fusion of right lower extremity on 02/22/2018 who was discharged home on 02/23/2018 presented back to the ED on 02/24/2018 with subjective fevers, severe right foot pain, decreased oral intake.  Patient noted at time of admission to have sepsis physiology with temp of 100.8, tachycardia, hypotension.  Patient also with severe hypokalemia, acute on chronic kidney disease stage IV, hypomagnesemia and some hypoxia noted.  Patient admitted to stepdown unit, blood cultures obtained, orthopedic consulted and patient placed empirically on IV antibiotics.  New events last 24 hours / Subjective: No new events overnight.  Patient sitting in bed, eating breakfast.  Denies any chest pain, shortness of breath, nausea, vomiting.  Pain in her right foot has been well controlled overall.  Afebrile overnight.  Assessment & Plan:   Principal Problem:   Sepsis ([place]) Active Problems:   CAD S/P percutaneous coronary angioplasty   Essential hypertension   Dyslipidemia, goal LDL below 70   Dementia (HCC)   GERD (gastroesophageal reflux disease)   CKD (chronic kidney disease) stage 4, GFR 15-29 ml/min (HCC)   Acute kidney injury superimposed on CKD ([place])   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 (HCC)   Malignant 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, estrogen receptor positive ([place])   Malignant neoplasm of upper-outer quadrant of left breast in female, estrogen receptor positive (HCC)   Chronic diastolic heart failure (HCC)   Hypokalemia   Urinary retention   Elevated troponin    Hypomagnesemia   SOB (shortness of breath)   Hypotension   Acute on chronic diastolic CHF (congestive heart failure) ([place])   SIRS Patient presented with subjective fevers, significant right foot pain post recent surgery 02/22/2018, tachycardia and hypotension.  Patient noted to have systolic blood pressure drop as low as 68/40.  Blood cultures obtained with no growth to date.  Chest x-Baller negative for any acute infiltrate.  Urinalysis unremarkable.  Urine cultures negative. VQ scan negative for PE.  Lower extremity Dopplers negative. Patient seen in consultation by orthopedics who have a very low suspicion for a foot infection. She has been afebrile overnight; will transition antibiotics to PO flagyl and keflex to finish empiric treatment 5 days.   Hypotension Questionable etiology.  May be secondary to volume depletion in the setting of diuretics of Bumex and metolazone versus infectious etiology.  Patient states has had times at home where systolic blood pressures has been in the 60s and has felt symptomatic with dizziness and the feeling that she was leaning. Random cortisol at 14.4.  TSH within normal limits at 2.908. Patient started on Bumex due to volume overload.  Also started patient on midodrine 5 mg p.o. 3 times daily. BP improved today 128/77   Acute renal failure on chronic kidney disease stage IV -Patient with a baseline creatinine anywhere from 1.5-2.  Creatinine on admission was 2.92.  Urinalysis nitrite negative, trace leukocytes, negative protein.  Renal function slowly trending down to baseline level.  Dr. Grandville Silos discussed with patient's nephrologist [name].    Urinary retention Patient with a history of urinary retention.  Patient states she does self catheterizations at home  as needed.  Continue I and O cath every 6 hours as needed.    Elevated troponin Troponins minimally elevated and slowly trending down.  Likely secondary to problem #1.  Patient denies any overt chest  pain.  VQ scan negative for PE.  EKG on admission with a sinus tachycardia with no ischemic changes noted.  2D echo with LVH, EF 65 to 70%, no wall motion abnormalities, grade 1 diastolic dysfunction.  Highly doubt if this is ACS.   Hypertension Patient noted to be hypotensive on admission.  Patient's antihypertensive medications were held on admission.  Patient now with volume overload.  Patient will be started on Bumex 2 mg p.o. twice daily due to volume overload as patient is symptomatic.  Decrease Toprol-XL to 12.5 mg daily.  Start midodrine 5 mg 3 times daily due to low blood pressure.   Dyslipidemia Continue statin  Acute on chronic diastolic heart failure Patient was on the dry side on admission.  Patient noted to be hypotensive on presentation with blood pressure dropping as low as 80/47.  Patient now with lower extremity edema and bibasilar crackles with episode of shortness of breath overnight.  Chest x-Springs consistent with vascular congestion.  IV fluids were saline locked on 02/27/2018.  Will start patient on Bumex 2 mg p.o. twice daily 02/28/2018.  Start patient on midodrine secondary to hypotension in order to be able to diurese patient adequately.  Dementia Stable.  Patient alert and oriented answering questions appropriately.  Monitor for sundowning.  Gastroesophageal reflux disease Continue PPI.   Type 2 diabetes mellitus with complications Hemoglobin A1c 7.1.  Continue carb modified diet. Continue home regimen of NPH 20 units in the morning and 25 units at bedtime.  Continue sliding scale insulin.   History of gout Continue allopurinol  History of coronary artery disease Continue Plavix.  Plavix held on admission due to concern of possible surgical intervention.  Plavix has been resumed.   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 Sharol Given Patient has been seen in  consultation by orthopedics who has a low suspicion of a foot infection.  Per orthopedics nonweightbearing right lower extremity.  Up with PT when able.  Per orthopedics.  History of breast cancer Continue Aromasin.  Outpatient follow-up with oncology.   DVT prophylaxis: Heparin Code Status: Full Family Communication: Great-grand daughter at bedside  Disposition Plan: SNF once placement available and PASRR obtained.    Consultants:   Orthopedics  Procedures:   None   Antimicrobials:  Anti-infectives (From admission, onward)   Start     Dose/Rate Route Frequency Ordered Stop   03/01/18 1000  cephALEXin (KEFLEX) capsule 250 mg     250 mg Oral Every 8 hours 03/01/18 0928     02/26/18 2200  vancomycin (VANCOCIN) IVPB 1000 mg/200 mL premix  Status:  Discontinued     1,000 mg 200 mL/hr over 60 Minutes Intravenous Every 48 hours 02/24/18 2116 02/27/18 0802   02/26/18 0600  metroNIDAZOLE (FLAGYL) tablet 500 mg     500 mg Oral Every 8 hours 02/25/18 1549     02/25/18 2200  ceFEPIme (MAXIPIME) 1 g in sodium chloride 0.9 % 100 mL IVPB  Status:  Discontinued     1 g 200 mL/hr over 30 Minutes Intravenous Every 24 hours 02/24/18 2116 03/01/18 0928   02/24/18 2115  ceFEPIme (MAXIPIME) 2 g in sodium chloride 0.9 % 100 mL IVPB     2 g  200 mL/hr over 30 Minutes Intravenous  Once 02/24/18 2103 02/24/18 2341   02/24/18 2115  metroNIDAZOLE (FLAGYL) IVPB 500 mg     500 mg 100 mL/hr over 60 Minutes Intravenous Every 8 hours 02/24/18 2103 02/25/18 2205   02/24/18 2115  vancomycin (VANCOCIN) IVPB 1000 mg/200 mL premix     1,000 mg 200 mL/hr over 60 Minutes Intravenous  Once 02/24/18 2103 02/25/18 0049        Objective: Vitals:   03/01/18 0400 03/01/18 0743 03/01/18 0800 03/01/18 1241  BP: 99/61 97/78 114/72 128/77  Pulse: 71 71 71 75  Resp: 18     Temp: 98.5 F (36.9 C) 98 F (36.7 C) 98 F (36.7 C) 98 F (36.7 C)  TempSrc: Oral Oral Oral Oral  SpO2: 91% 93% 94% 100%  Weight:       Height:        Intake/Output Summary (Last 24 hours) at 03/01/2018 1353 Last data filed at 03/01/2018 0612 Gross per 24 hour  Intake -  Output 3050 ml  Net -3050 ml   Filed Weights   02/24/18 2105  Weight: 86.8 kg    Examination:  General exam: Appears calm and comfortable  Respiratory system: Clear to auscultation. Respiratory effort normal. Cardiovascular system: S1 & S2 heard, RRR. No JVD, murmurs, rubs, gallops or clicks. No pedal edema. Gastrointestinal system: Abdomen is nondistended, soft and nontender. No organomegaly or masses felt. Normal bowel sounds heard. Central nervous system: Alert and oriented. No focal neurological deficits. Extremities: +Right leg in cast  Skin: No rashes, lesions or ulcers Psychiatry: Judgement and insight appear normal. Mood & affect appropriate.   Data Reviewed: I have personally reviewed following labs and imaging studies  CBC: Recent Labs  Lab 02/24/18 1457 02/25/18 0212 02/25/18 0725 02/26/18 0236 02/27/18 0205 03/01/18 0213  WBC 13.4* 9.0 10.4 8.6 9.2 10.1  NEUTROABS 10.5* 6.8  --  6.3 6.9 7.6  HGB 11.5* 9.5* 11.0* 9.7* 9.3* 9.6*  HCT 36.0 30.2* 35.1* 31.9* 29.6* 30.1*  MCV 94.2 94.1 93.6 95.2 94.0 93.8  PLT 182 163 166 196 199 209   Basic Metabolic Panel: Recent Labs  Lab 02/24/18 1958  02/25/18 0725 02/25/18 1704 02/26/18 0236 02/27/18 0205 02/28/18 0300 03/01/18 0213  NA  --    < > 139 138 138 138 137 140  K  --    < > 2.5* 2.9* 3.4* 2.8* 3.2* 4.0  CL  --    < > 96* 98 102 101 101 102  CO2  --    < > 28 29 26  32 28 27  GLUCOSE  --    < > 123* 182* 138* 155* 108* 80  BUN  --    < > 42* 38* 36* 28* 23 23  CREATININE  --    < > 2.48* 2.33* 2.19* 1.98* 1.80* 1.90*  CALCIUM  --    < > 9.1 8.9 8.8* 8.6* 8.9 9.2  MG 1.6*  --  1.8  --  2.2  --  2.2  --   PHOS  --   --  3.0  --   --  1.7* 2.8 2.7   < > = values in this interval not displayed.   GFR: Estimated Creatinine Clearance: 26.6 mL/min (A) (by C-G formula  based on SCr of 1.9 mg/dL (H)). Liver Function Tests: Recent Labs  Lab 02/24/18 1457 02/25/18 0725 02/27/18 0205 03/01/18 0213  AST 41 35  --   --   ALT  10 10  --   --   ALKPHOS 74 65  --   --   BILITOT 0.6 0.6  --   --   PROT 6.6 6.0*  --   --   ALBUMIN 3.1* 2.7* 2.3* 2.3*   No results for input(s): LIPASE, AMYLASE in the last 168 hours. No results for input(s): AMMONIA in the last 168 hours. Coagulation Profile: Recent Labs  Lab 02/24/18 2112  INR 1.28   Cardiac Enzymes: Recent Labs  Lab 02/24/18 2027 02/25/18 0212 02/25/18 0725  TROPONINI 0.05* 0.04* 0.03*   BNP (last 3 results) No results for input(s): PROBNP in the last 8760 hours. HbA1C: No results for input(s): HGBA1C in the last 72 hours. CBG: Recent Labs  Lab 02/28/18 1155 02/28/18 1712 02/28/18 2134 03/01/18 0746 03/01/18 1240  GLUCAP 174* 173* 129* 80 77   Lipid Profile: No results for input(s): CHOL, HDL, LDLCALC, TRIG, CHOLHDL, LDLDIRECT in the last 72 hours. Thyroid Function Tests: No results for input(s): TSH, T4TOTAL, FREET4, T3FREE, THYROIDAB in the last 72 hours. Anemia Panel: No results for input(s): VITAMINB12, FOLATE, FERRITIN, TIBC, IRON, RETICCTPCT in the last 72 hours. Sepsis Labs: Recent Labs  Lab 02/24/18 1510 02/24/18 2112 02/24/18 2346  PROCALCITON  --  1.33  --   LATICACIDVEN 1.21 1.1 0.8    Recent Results (from the past 240 hour(s))  MRSA PCR Screening     Status: None   Collection Time: 02/24/18  9:06 PM  Result Value Ref Range Status   MRSA by PCR NEGATIVE NEGATIVE Final    Comment:        The GeneXpert MRSA Assay (FDA approved for NASAL specimens only), is one component of a comprehensive MRSA colonization surveillance program. It is not intended to diagnose MRSA infection nor to guide or monitor treatment for MRSA infections. Performed at [facility], [place] [address] [place], [place] [address]   Culture, blood (x 2)     Status: None   Collection  Time: 02/24/18  9:10 PM  Result Value Ref Range Status   Specimen Description BLOOD RIGHT HAND  Final   Special Requests   Final    BOTTLES DRAWN AEROBIC AND ANAEROBIC Blood Culture results may not be optimal due to an excessive volume of blood received in culture bottles   Culture   Final    NO GROWTH 5 DAYS Performed at [facility], [place] [address], [place], [place] [address]    Report Status 03/01/2018 FINAL  Final  Culture, blood (x 2)     Status: None   Collection Time: 02/24/18  9:21 PM  Result Value Ref Range Status   Specimen Description BLOOD RIGHT HAND  Final   Special Requests   Final    BOTTLES DRAWN AEROBIC ONLY Blood Culture adequate volume   Culture   Final    NO GROWTH 5 DAYS Performed at [facility], [place] [address], [place], [place] [address]    Report Status 03/01/2018 FINAL  Final  Urine Culture     Status: None   Collection Time: 02/25/18  3:15 PM  Result Value Ref Range Status   Specimen Description URINE, RANDOM  Final   Special Requests NONE  Final   Culture   Final    NO GROWTH Performed at [facility], [place] [address] [place],  [address]    Report Status 02/26/2018 FINAL  Final       Radiology Studies: Dg Chest  Port 1 View  Result Date: 02/28/2018 CLINICAL DATA:  Shortness of breath EXAM: PORTABLE CHEST 1 VIEW COMPARISON:  02/24/2018 FINDINGS: Borderline heart size. Bibasilar opacities, likely atelectasis. Aortic atherosclerosis. Mild vascular congestion. No effusions or acute bony abnormality. IMPRESSION: Borderline heart size.  Mild vascular congestion. Bibasilar atelectasis. Electronically Signed   By: Rolm Baptise M.D.   On: 02/28/2018 02:37      Scheduled Meds: . allopurinol  100 mg Oral BID  . bumetanide  2 mg Oral BID  . cephALEXin  250 mg Oral Q8H  . clopidogrel  75 mg Oral Daily  . exemestane  25 mg Oral Daily  . gabapentin  300 mg Oral TID  . heparin injection (subcutaneous)  5,000 Units Subcutaneous Q8H   . insulin aspart  0-9 Units Subcutaneous TID WC  . insulin NPH Human  20 Units Subcutaneous Q breakfast  . insulin NPH Human  25 Units Subcutaneous QHS  . metoprolol succinate  12.5 mg Oral QPM  . metroNIDAZOLE  500 mg Oral Q8H  . midodrine  5 mg Oral TID WC  . pantoprazole  40 mg Oral QAC breakfast  . potassium chloride SA  40 mEq Oral BID  . pramipexole  0.25 mg Oral QHS  . pramipexole  0.5 mg Oral Daily  . rosuvastatin  20 mg Oral Q supper  . senna  1 tablet Oral BID   Continuous Infusions:   LOS: 4 days    Time spent: 45 minutes   Dessa Phi, DO [facility] [email or web address] Password [email or web address] 03/01/2018, 1:53 PM

## 2018-03-02 ENCOUNTER — Other Ambulatory Visit: Payer: Self-pay | Admitting: *Deleted

## 2018-03-02 DIAGNOSIS — Z9861 Coronary angioplasty status: Secondary | ICD-10-CM | POA: Diagnosis not present

## 2018-03-02 DIAGNOSIS — M79671 Pain in right foot: Secondary | ICD-10-CM | POA: Diagnosis not present

## 2018-03-02 DIAGNOSIS — E1121 Type 2 diabetes mellitus with diabetic nephropathy: Secondary | ICD-10-CM | POA: Diagnosis not present

## 2018-03-02 DIAGNOSIS — R600 Localized edema: Secondary | ICD-10-CM | POA: Diagnosis not present

## 2018-03-02 DIAGNOSIS — N179 Acute kidney failure, unspecified: Secondary | ICD-10-CM | POA: Diagnosis not present

## 2018-03-02 DIAGNOSIS — Z79899 Other long term (current) drug therapy: Secondary | ICD-10-CM | POA: Diagnosis not present

## 2018-03-02 DIAGNOSIS — A4189 Other specified sepsis: Secondary | ICD-10-CM | POA: Diagnosis not present

## 2018-03-02 DIAGNOSIS — I10 Essential (primary) hypertension: Secondary | ICD-10-CM | POA: Diagnosis not present

## 2018-03-02 DIAGNOSIS — I2510 Atherosclerotic heart disease of native coronary artery without angina pectoris: Secondary | ICD-10-CM | POA: Diagnosis not present

## 2018-03-02 DIAGNOSIS — R7989 Other specified abnormal findings of blood chemistry: Secondary | ICD-10-CM | POA: Diagnosis not present

## 2018-03-02 DIAGNOSIS — E785 Hyperlipidemia, unspecified: Secondary | ICD-10-CM | POA: Diagnosis not present

## 2018-03-02 DIAGNOSIS — Z7902 Long term (current) use of antithrombotics/antiplatelets: Secondary | ICD-10-CM | POA: Diagnosis not present

## 2018-03-02 DIAGNOSIS — Z96652 Presence of left artificial knee joint: Secondary | ICD-10-CM | POA: Diagnosis not present

## 2018-03-02 DIAGNOSIS — R072 Precordial pain: Secondary | ICD-10-CM | POA: Diagnosis not present

## 2018-03-02 DIAGNOSIS — I5032 Chronic diastolic (congestive) heart failure: Secondary | ICD-10-CM | POA: Diagnosis not present

## 2018-03-02 DIAGNOSIS — Z7401 Bed confinement status: Secondary | ICD-10-CM | POA: Diagnosis not present

## 2018-03-02 DIAGNOSIS — E1122 Type 2 diabetes mellitus with diabetic chronic kidney disease: Secondary | ICD-10-CM | POA: Diagnosis not present

## 2018-03-02 DIAGNOSIS — K219 Gastro-esophageal reflux disease without esophagitis: Secondary | ICD-10-CM | POA: Diagnosis not present

## 2018-03-02 DIAGNOSIS — R0789 Other chest pain: Secondary | ICD-10-CM | POA: Diagnosis not present

## 2018-03-02 DIAGNOSIS — I130 Hypertensive heart and chronic kidney disease with heart failure and stage 1 through stage 4 chronic kidney disease, or unspecified chronic kidney disease: Secondary | ICD-10-CM | POA: Diagnosis not present

## 2018-03-02 DIAGNOSIS — M2550 Pain in unspecified joint: Secondary | ICD-10-CM | POA: Diagnosis not present

## 2018-03-02 DIAGNOSIS — Z794 Long term (current) use of insulin: Secondary | ICD-10-CM | POA: Diagnosis not present

## 2018-03-02 DIAGNOSIS — Z85828 Personal history of other malignant neoplasm of skin: Secondary | ICD-10-CM | POA: Diagnosis not present

## 2018-03-02 DIAGNOSIS — E876 Hypokalemia: Secondary | ICD-10-CM | POA: Diagnosis not present

## 2018-03-02 DIAGNOSIS — N184 Chronic kidney disease, stage 4 (severe): Secondary | ICD-10-CM | POA: Diagnosis not present

## 2018-03-02 DIAGNOSIS — F0390 Unspecified dementia without behavioral disturbance: Secondary | ICD-10-CM | POA: Diagnosis not present

## 2018-03-02 DIAGNOSIS — C50412 Malignant neoplasm of upper-outer quadrant of left female breast: Secondary | ICD-10-CM | POA: Diagnosis not present

## 2018-03-02 DIAGNOSIS — I959 Hypotension, unspecified: Secondary | ICD-10-CM | POA: Diagnosis not present

## 2018-03-02 DIAGNOSIS — R079 Chest pain, unspecified: Secondary | ICD-10-CM | POA: Diagnosis not present

## 2018-03-02 DIAGNOSIS — E118 Type 2 diabetes mellitus with unspecified complications: Secondary | ICD-10-CM | POA: Diagnosis not present

## 2018-03-02 DIAGNOSIS — R000 Tachycardia, unspecified: Secondary | ICD-10-CM | POA: Diagnosis not present

## 2018-03-02 DIAGNOSIS — Z170 Estrogen receptor positive status [ER+]: Secondary | ICD-10-CM | POA: Diagnosis not present

## 2018-03-02 DIAGNOSIS — R0902 Hypoxemia: Secondary | ICD-10-CM | POA: Diagnosis not present

## 2018-03-02 DIAGNOSIS — E1165 Type 2 diabetes mellitus with hyperglycemia: Secondary | ICD-10-CM | POA: Diagnosis not present

## 2018-03-02 DIAGNOSIS — Z853 Personal history of malignant neoplasm of breast: Secondary | ICD-10-CM | POA: Diagnosis not present

## 2018-03-02 DIAGNOSIS — A419 Sepsis, unspecified organism: Secondary | ICD-10-CM | POA: Diagnosis not present

## 2018-03-02 DIAGNOSIS — Z981 Arthrodesis status: Secondary | ICD-10-CM | POA: Diagnosis not present

## 2018-03-02 DIAGNOSIS — C50211 Malignant neoplasm of upper-inner quadrant of right female breast: Secondary | ICD-10-CM | POA: Diagnosis not present

## 2018-03-02 DIAGNOSIS — M109 Gout, unspecified: Secondary | ICD-10-CM | POA: Diagnosis not present

## 2018-03-02 DIAGNOSIS — R0602 Shortness of breath: Secondary | ICD-10-CM | POA: Diagnosis not present

## 2018-03-02 DIAGNOSIS — R339 Retention of urine, unspecified: Secondary | ICD-10-CM | POA: Diagnosis not present

## 2018-03-02 DIAGNOSIS — Z96632 Presence of left artificial wrist joint: Secondary | ICD-10-CM | POA: Diagnosis not present

## 2018-03-02 DIAGNOSIS — R6520 Severe sepsis without septic shock: Secondary | ICD-10-CM | POA: Diagnosis not present

## 2018-03-02 DIAGNOSIS — M25571 Pain in right ankle and joints of right foot: Secondary | ICD-10-CM | POA: Diagnosis not present

## 2018-03-02 DIAGNOSIS — I5033 Acute on chronic diastolic (congestive) heart failure: Secondary | ICD-10-CM | POA: Diagnosis not present

## 2018-03-02 LAB — BASIC METABOLIC PANEL
Anion gap: 9 (ref 5–15)
BUN: 25 mg/dL — ABNORMAL HIGH (ref 8–23)
CO2: 29 mmol/L (ref 22–32)
Calcium: 9.3 mg/dL (ref 8.9–10.3)
Chloride: 99 mmol/L (ref 98–111)
Creatinine, Ser: 1.83 mg/dL — ABNORMAL HIGH (ref 0.44–1.00)
GFR calc Af Amer: 30 mL/min — ABNORMAL LOW (ref >60)
GFR calc non Af Amer: 26 mL/min — ABNORMAL LOW (ref >60)
Glucose, Bld: 121 mg/dL — ABNORMAL HIGH (ref 70–99)
Potassium: 4.9 mmol/L (ref 3.5–5.1)
Sodium: 137 mmol/L (ref 135–145)

## 2018-03-02 LAB — GLUCOSE, CAPILLARY
Glucose-Capillary: 122 mg/dL — ABNORMAL HIGH (ref 70–99)
Glucose-Capillary: 107 mg/dL — ABNORMAL HIGH (ref 70–99)
Glucose-Capillary: 83 mg/dL (ref 70–99)

## 2018-03-02 MED ORDER — DIAZEPAM 5 MG PO TABS: 5.0000 mg | ORAL_TABLET | Freq: Two times a day (BID) | ORAL | 0 refills | Status: DC | PRN

## 2018-03-02 MED ORDER — BUMETANIDE 2 MG PO TABS: 2.0000 mg | ORAL_TABLET | Freq: Two times a day (BID) | ORAL | 0 refills | Status: DC

## 2018-03-02 MED ORDER — METOPROLOL SUCCINATE ER 25 MG PO TB24: 12.5000 mg | ORAL_TABLET | Freq: Every evening | ORAL | 0 refills | Status: DC

## 2018-03-02 MED ORDER — OXYCODONE-ACETAMINOPHEN 5-325 MG PO TABS: 1.0000 | ORAL_TABLET | ORAL | 0 refills | Status: AC | PRN

## 2018-03-02 MED ORDER — MIDODRINE HCL 5 MG PO TABS: 5.0000 mg | ORAL_TABLET | Freq: Three times a day (TID) | ORAL | 0 refills | Status: DC

## 2018-03-02 NOTE — Clinical Social Work Placement (Signed)
   CLINICAL SOCIAL WORK PLACEMENT  NOTE  Date:  03/02/2018  Patient Details  Name: [name] MRN: [id number] Date of Birth: Jan 12, 1941  Clinical Social Work is seeking post-discharge placement for this patient at the [place] level of care (*CSW will initial, date and re-position this form in  chart as items are completed):      Patient/family provided with [place] Work Department's list of facilities offering this level of care within the geographic area requested by the patient (or if unable, by the patient's family).  Yes   Patient/family informed of their freedom to choose among providers that offer the needed level of care, that participate in Medicare, Medicaid or managed care program needed by the patient, have an available bed and are willing to accept the patient.      Patient/family informed of [facility]'s ownership interest in [facility] and [facility], as well as of the fact that they are under no obligation to receive care at these facilities.  PASRR submitted to EDS on 03/01/18     PASRR number received on 03/02/18     Existing PASRR number confirmed on       FL2 transmitted to all facilities in geographic area requested by pt/family on 03/01/18     FL2 transmitted to all facilities within larger geographic area on       Patient informed that his/her managed care company has contracts with or will negotiate with certain facilities, including the following:        Yes   Patient/family informed of bed offers received.  Patient chooses bed at [place] [place]     Physician recommends and patient chooses bed at      Patient to be transferred to [facility] on 03/02/18.  Patient to be transferred to facility by [facility]     Patient family notified on 03/02/18 of transfer.  Name of family member notified:  Quaneshia Wareing     PHYSICIAN Please prepare prescriptions     Additional Comment:     _______________________________________________ Candie Chroman, LCSW 03/02/2018, 12:51 PM

## 2018-03-02 NOTE — Clinical Social Work Note (Signed)
CSW facilitated patient discharge including contacting patient family (spoke with husband earlier and daughter at bedside now) and facility to confirm patient discharge plans. Clinical information faxed to facility and family agreeable with plan. CSW arranged ambulance transport via [facility] to [facility]. RN to call report prior to discharge [phone] Room 210).  CSW will sign off for now as social work intervention is no longer needed. Please consult [place] again if new needs arise.  Dayton Scrape, [place]

## 2018-03-02 NOTE — Discharge Summary (Signed)
Physician Discharge Summary  KEISY STRICKLER FXT:[id number] DOB: 31-May-1940 DOA: 02/24/2018  PCP: Redmond School, MD  Admit date: 02/24/2018 Discharge date: 03/02/2018  Admitted From: Home Disposition:  SNF   Recommendations for Outpatient Follow-up:  1. Follow up with PCP in 1 week 2. Follow up with Dr. Sharol Given   Discharge Condition: Stable CODE STATUS: Full  Diet recommendation: Carb modified   Restrictions: Non weightbearing on RLE   Brief/Interim Summary: [name] is a 77 year-old female with history of chronic kidney disease stage IV, gout, type 2 diabetes, history of breast cancer in remission on oral chemotherapy, prior history of CVA/TIA, peripheral neuropathy status post recent subtalar, talonavicular fusion of right lower extremity on 02/22/2018 who was discharged home on 02/23/2018 presented back to the ED on 02/24/2018 with subjective fevers, severe right foot pain, decreased oral intake. Patient noted at time of admission to have sepsis physiology with temp of 100.8, tachycardia, hypotension. Patient also with severe hypokalemia, acute on chronic kidney disease stage IV, hypomagnesemia and some hypoxia noted. Patient admitted to stepdown unit, blood cultures obtained, orthopedic consulted and patient placed empirically on IV antibiotics. Patient seen in consultation by orthopedics who have a very low suspicion for a foot infection. She continued to improve with antibiotics, IVF. Due to fluid overload, bumex was restarted and she was also started on midodrine for hypotension.   Discharge Diagnoses:  Principal Problem:   Sepsis ([place]) Active Problems:   CAD S/P percutaneous coronary angioplasty   Essential hypertension   Dyslipidemia, goal LDL below 70   Dementia (HCC)   GERD (gastroesophageal reflux disease)   CKD (chronic kidney disease) stage 4, GFR 15-29 ml/min (HCC)   Acute kidney injury superimposed on CKD ([place])   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  (HCC)   Malignant 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, estrogen receptor positive ([place])   Malignant neoplasm of upper-outer quadrant of left breast in female, estrogen receptor positive (HCC)   Chronic diastolic heart failure (HCC)   Hypokalemia   Urinary retention   Elevated troponin   Hypomagnesemia   SOB (shortness of breath)   Hypotension   Acute on chronic diastolic CHF (congestive heart failure) ([place])  SIRS Patient presented with subjective fevers, significant right foot pain post recent surgery 02/22/2018, tachycardia and hypotension. Patient noted to have systolic blood pressure drop as low as 68/40. Blood cultures obtained with no growth to date. Chest x-Mckissic negative for any acute infiltrate. Urinalysis unremarkable. Urine cultures negative. VQ scan negative for PE. Lower extremity Dopplers negative. Patient seen in consultation by orthopedics who have a very low suspicion for a foot infection. She has been afebrile overnight; completed antibiotic treatment.   Hypotension Questionable etiology. May be secondary to volume depletion in the setting of diuretics of Bumex and metolazone versus infectious etiology. Patient states has had times at home where systolic blood pressures has been in the 60s and has felt symptomatic with dizziness and the feeling that she was leaning.Random cortisol at 14.4. TSH within normal limits at 2.908. Patient started on Bumex due to volume overload. Also started patient on midodrine 5 mg p.o. 3 times daily. BP improved and stable.   Acute renal failure on chronic kidney disease stage IV Patient with a baseline creatinine anywhere from 1.5-2. Creatinine on admission was 2.92. Urinalysis nitrite negative, trace leukocytes, negative protein. Renal function slowly trending down to baseline level and has been stable. Dr. Grandville Silos discussed with patient's nephrologist [name].  Urinary retention Patient with a history  of urinary retention. Patient states she does self catheterizations at home as needed. Continue I and O cath every 6 hours as needed.   Elevated troponin Troponins minimally elevated and slowly trending down. Likely secondary to problem #1. Patient denies any overt chest pain. VQ scan negative for PE. EKG on admission with a sinus tachycardia with no ischemic changes noted.2D echo with LVH, EF 65 to 70%, no wall motion abnormalities, grade 1 diastolic dysfunction. Highly doubt if this is ACS.   Hypertension Patient noted to be hypotensive on admission. Patient's antihypertensive medications were held on admission. Patient now with volume overload. Patient will be started on Bumex 2 mg p.o. twice daily due to volume overload as patient is symptomatic. Decrease Toprol-XL to 12.5 mg daily. Start midodrine 5 mg 3 times daily due to low blood pressure.   Dyslipidemia Continue statin  Acute on chronic diastolic heart failure Patient was on the dry side on admission. Patient noted to be hypotensive on presentation with blood pressure dropping as low as 80/47. Patient now with lower extremity edema and bibasilar crackles with episode of shortness of breath overnight. Chest x-Blow consistent with vascular congestion. IV fluids were saline locked on 02/27/2018. Will start patient on Bumex 2 mg p.o. twice daily 02/28/2018. Start patient on midodrine secondary to hypotension in order to be able to diurese patient adequately.  Dementia Stable. Patient alert and oriented answering questions appropriately. Monitor for sundowning.  Gastroesophageal reflux disease Continue PPI.   Type 2 diabetes mellitus with complications Hemoglobin A1c 7.1. Continue carb modified diet. Continue home regimen of NPH 20 units in the morning and 25 units at bedtime.   History of gout Continue allopurinol  History of coronary artery disease Continue Plavix.Plavix held on admission due to  concern of possible surgical intervention. Plavix has been resumed.   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 Sharol Given Patient has been seen in consultation by orthopedics who has a low suspicion of a foot infection. Per orthopedics nonweightbearing right lower extremity. Up with PT when able. Per orthopedics.  History of breast cancer Continue Aromasin. Outpatient follow-up with oncology.   Discharge Instructions  Discharge Instructions    Call MD for:  difficulty breathing, headache or visual disturbances   Complete by:  As directed    Call MD for:  extreme fatigue   Complete by:  As directed    Call MD for:  hives   Complete by:  As directed    Call MD for:  persistant dizziness or light-headedness   Complete by:  As directed    Call MD for:  persistant nausea and vomiting   Complete by:  As directed    Call MD for:  redness, tenderness, or signs of infection (pain, swelling, redness, odor or green/yellow discharge around incision site)   Complete by:  As directed    Call MD for:  severe uncontrolled pain   Complete by:  As directed    Call MD for:  temperature >100.4   Complete by:  As directed    Diet Carb Modified   Complete by:  As directed    Discharge instructions   Complete by:  As directed    You were cared for by a hospitalist during your hospital stay. If you have any questions about your discharge medications or the care you received while you were in the hospital after you are discharged, you  can call the unit and ask to speak with the hospitalist on call if the hospitalist that took care of you is not available. Once you are discharged, your primary care physician will handle any further medical issues. Please note that NO REFILLS for any discharge medications will be authorized once you are discharged, as it is imperative that you return to your primary care  physician (or establish a relationship with a primary care physician if you do not have one) for your aftercare needs so that they can reassess your need for medications and monitor your lab values.   Increase activity slowly   Complete by:  As directed    Other Restrictions   Complete by:  As directed    Nonweightbearing RLE     Allergies as of 03/02/2018      Reactions   Ultram [tramadol] Other (See Comments)   Pt has seizure activity with this medication   Lipitor [atorvastatin] Other (See Comments)   Bone and muscle pain   Lyrica [pregabalin] Other (See Comments)   Weight gain, extremity swelling   Reglan [metoclopramide] Other (See Comments)   Tardive dyskinesia; paradoxical reaction not relieved by benadryl   Nsaids Swelling   SWELLING REACTION UNSPECIFIED       Medication List    STOP taking these medications   metolazone 2.5 MG tablet Commonly known as:  ZAROXOLYN   mirtazapine 15 MG tablet Commonly known as:  REMERON     TAKE these medications   allopurinol 100 MG tablet Commonly known as:  ZYLOPRIM Take 1 tablet (100 mg total) by mouth 2 (two) times daily.   bumetanide 2 MG tablet Commonly known as:  BUMEX Take 1 tablet (2 mg total) by mouth 2 (two) times daily. What changed:    how much to take  when to take this  additional instructions   CALCIUM 600+D3 600-800 MG-UNIT Tabs Generic drug:  Calcium Carb-Cholecalciferol Take 1 tablet by mouth daily.   CEREFOLIN NAC 6-2-600 MG Tabs TAKE 1 CAPLET BY MOUTH ONCE DAILY. What changed:    how much to take  how to take this  when to take this  additional instructions   clopidogrel 75 MG tablet Commonly known as:  PLAVIX TAKE 1 TABLET DAILY   COD LIVER OIL PO Take 415 mg by mouth daily.   colchicine 0.6 MG tablet Take 1 tablet (0.6 mg total) by mouth daily. PRN gout flare What changed:    when to take this  reasons to take this  additional instructions   Cranberry 500 MG Tabs Take 500  mg by mouth every evening.   diazepam 5 MG tablet Commonly known as:  VALIUM Take 1 tablet (5 mg total) by mouth 2 (two) times daily as needed for anxiety.   exemestane 25 MG tablet Commonly known as:  AROMASIN TAKE 1 TABLET DAILY AFTER BREAKFAST What changed:  See the new instructions.   gabapentin 300 MG capsule Commonly known as:  NEURONTIN Take 1 capsule (300 mg total) by mouth 2 (two) times daily. Take 1 during the day if needed and 2 at night What changed:    when to take this  additional instructions   insulin NPH Human 100 UNIT/ML injection Commonly known as:  HUMULIN N,NOVOLIN N Inject 20-25 Units into the skin See admin instructions. Inject 20 units in the morning & inject 25 units after supper.   Krill Oil 500 MG Caps Take 500 mg by mouth daily.   metoprolol succinate 25 MG  24 hr tablet Commonly known as:  TOPROL-XL Take 0.5 tablets (12.5 mg total) by mouth every evening. What changed:    medication strength  how much to take  how to take this  when to take this  additional instructions   midodrine 5 MG tablet Commonly known as:  PROAMATINE Take 1 tablet (5 mg total) by mouth 3 (three) times daily with meals.   NITROMIST 400 MCG/SPRAY Aers Generic drug:  Nitroglycerin Place 1 spray onto the tongue every 5 (five) minutes x 3 doses as needed (chest pain.).   oxyCODONE-acetaminophen 5-325 MG tablet Commonly known as:  PERCOCET/ROXICET Take 1 tablet by mouth every 4 (four) hours as needed for up to 5 days.   pantoprazole 40 MG tablet Commonly known as:  PROTONIX TAKE 1 TABLET DAILY (CONTACT OFFICE FOR ADDITIONAL REFILLS, FINAL ATTEMPT) What changed:  See the new instructions.   potassium chloride SA 20 MEQ tablet Commonly known as:  K-DUR,KLOR-CON Take 40 mEq by mouth 2 (two) times daily.   pramipexole 0.25 MG tablet Commonly known as:  MIRAPEX TAKE 1 TABLET AT 4:00 P.M. AND 2 TABLETS (0.5 MG) AT BEDTIME What changed:    how much to  take  how to take this  when to take this  additional instructions   QUNOL COQ10/UBIQUINOL/MEGA 100 MG Caps Generic drug:  Ubiquinol Take 100 mg by mouth every evening. CoQ10, Qunol   RESERVAPAK PO Take 1 tablet by mouth every evening.   rosuvastatin 20 MG tablet Commonly known as:  CRESTOR Take 20 mg by mouth daily with supper.       Contact information for follow-up providers    Redmond School, MD. Schedule an appointment as soon as possible for a visit in 1 week(s).   Specialty:  Internal Medicine Contact information: [address] [place] [address] [phone]        Newt Minion, MD .   Specialty:  Orthopedic Surgery Contact information: [place] [address] [phone]            Contact information for after-discharge care    Destination    [facility] Preferred SNF .   Service:  Skilled Nursing Contact information: [place] [phone]                 Allergies  Allergen Reactions  . Ultram [Tramadol] Other (See Comments)    Pt has seizure activity with this medication  . Lipitor [Atorvastatin] Other (See Comments)    Bone and muscle pain  . Lyrica [Pregabalin] Other (See Comments)    Weight gain, extremity swelling  . Reglan [Metoclopramide] Other (See Comments)    Tardive dyskinesia; paradoxical reaction not relieved by benadryl  . Nsaids Swelling    SWELLING REACTION UNSPECIFIED      Procedures/Studies: Dg Chest 2 View  Result Date: 02/24/2018 CLINICAL DATA:  Episode of oxygen desaturation today. EXAM: CHEST - 2 VIEW COMPARISON:  PA and lateral chest 08/01/2017. FINDINGS: The lungs are clear. Bosselation of the right hemidiaphragm is noted. No pneumothorax or pleural effusion. Heart size is normal. Aortic atherosclerosis is seen. No acute or focal bony abnormality. IMPRESSION: No acute disease. Electronically Signed   By:  Inge Rise M.D.   On: 02/24/2018 18:00   Nm Pulmonary Vent And Perf (v/q Scan)  Result Date: 02/25/2018 CLINICAL DATA:  Clinical concern for pulmonary embolus. Low oxygen saturation. EXAM: NUCLEAR MEDICINE VENTILATION - PERFUSION LUNG SCAN TECHNIQUE: Ventilation  images were obtained in multiple projections using inhaled aerosol Tc-33m DTPA. Perfusion images were obtained in multiple projections after intravenous injection of Tc-75m MAA. RADIOPHARMACEUTICALS:  31.2 mCi of Tc-48m DTPA aerosol inhalation and 4.2 mCi Tc37m MAA IV COMPARISON:  Chest radiograph 02/24/2017 FINDINGS: Ventilation: No focal ventilation defect. Mild clumping of the radiotracer in the central airways. Perfusion: No wedge shaped peripheral perfusion defects to suggest acute pulmonary embolism. IMPRESSION: No evidence of pulmonary embolus. Mild clumping of the inhaled radiotracer, suggestive of an element of COPD. Electronically Signed   By: Fidela Salisbury M.D.   On: 02/25/2018 11:20   Dg Chest Port 1 View  Result Date: 02/28/2018 CLINICAL DATA:  Shortness of breath EXAM: PORTABLE CHEST 1 VIEW COMPARISON:  02/24/2018 FINDINGS: Borderline heart size. Bibasilar opacities, likely atelectasis. Aortic atherosclerosis. Mild vascular congestion. No effusions or acute bony abnormality. IMPRESSION: Borderline heart size.  Mild vascular congestion. Bibasilar atelectasis. Electronically Signed   By: Rolm Baptise M.D.   On: 02/28/2018 02:37   Vas [place] Lower Extremity Venous (dvt)  Result Date: 02/25/2018  Lower Venous Study Indications: Pain.  Risk Factors: Sepsis. Post op right subtalar and talonavicular fusion 02/22/18. Limitations: Pitting edema. Comparison Study: Prior negative study done 08/31/12, is available for                   comparison. Performing Technologist: Sharion Dove RVS  Examination Guidelines: A complete evaluation includes B-mode imaging, spectral Doppler, color Doppler, and power Doppler as needed of all  accessible portions of each vessel. Bilateral testing is considered an integral part of a complete examination. Limited examinations for reoccurring indications may be performed as noted.  Right Venous Findings: +---------+---------------+---------+-----------+----------+----------+          CompressibilityPhasicitySpontaneityPropertiesSummary    +---------+---------------+---------+-----------+----------+----------+ CFV      Full           Yes      Yes                             +---------+---------------+---------+-----------+----------+----------+ SFJ      Full                                                    +---------+---------------+---------+-----------+----------+----------+ FV Prox  Full                                                    +---------+---------------+---------+-----------+----------+----------+ FV Mid   Full                                                    +---------+---------------+---------+-----------+----------+----------+ FV Distal               Yes      Yes                  visualized +---------+---------------+---------+-----------+----------+----------+ PFV      Full                                                    +---------+---------------+---------+-----------+----------+----------+  POP                     Yes      Yes                             +---------+---------------+---------+-----------+----------+----------+ PTV      Full                                                    +---------+---------------+---------+-----------+----------+----------+ PERO     Full                                                    +---------+---------------+---------+-----------+----------+----------+  Left Venous Findings: +---------+---------------+---------+-----------+----------+----------+          CompressibilityPhasicitySpontaneityPropertiesSummary     +---------+---------------+---------+-----------+----------+----------+ CFV      Full           Yes      Yes                             +---------+---------------+---------+-----------+----------+----------+ SFJ      Full                                                    +---------+---------------+---------+-----------+----------+----------+ FV Prox  Full                                                    +---------+---------------+---------+-----------+----------+----------+ FV Mid   Full                                                    +---------+---------------+---------+-----------+----------+----------+ FV Distal               Yes      Yes                  visualized +---------+---------------+---------+-----------+----------+----------+ PFV      Full                                                    +---------+---------------+---------+-----------+----------+----------+ POP      Full           Yes      Yes                             +---------+---------------+---------+-----------+----------+----------+ PTV      Full                                                    +---------+---------------+---------+-----------+----------+----------+  PERO     Full                                                    +---------+---------------+---------+-----------+----------+----------+    Summary: Right: There is no evidence of deep vein thrombosis in the lower extremity. However, portions of this examination were limited- see technologist comments above. Left: There is no evidence of deep vein thrombosis in the lower extremity. However, portions of this examination were limited- see technologist comments above.  *See table(s) above for measurements and observations. Electronically signed by Servando Snare MD on 02/25/2018 at 1:51:18 PM.    Final       Discharge Exam: Vitals:   03/02/18 0745 03/02/18 1205  BP: 124/74   Pulse: 80   Resp: 15   Temp:  98.1 F (36.7 C) 97.8 F (36.6 C)  SpO2: 95%     General: Pt is alert, awake, not in acute distress Cardiovascular: RRR, S1/S2 +, no rubs, no gallops Respiratory: CTA bilaterally, no wheezing, no rhonchi Abdominal: Soft, NT, ND, bowel sounds + Extremities: no edema, no cyanosis, RLE in cast     The results of significant diagnostics from this hospitalization (including imaging, microbiology, ancillary and laboratory) are listed below for reference.     Microbiology: Recent Results (from the past 240 hour(s))  MRSA PCR Screening     Status: None   Collection Time: 02/24/18  9:06 PM  Result Value Ref Range Status   MRSA by PCR NEGATIVE NEGATIVE Final    Comment:        The GeneXpert MRSA Assay (FDA approved for NASAL specimens only), is one component of a comprehensive MRSA colonization surveillance program. It is not intended to diagnose MRSA infection nor to guide or monitor treatment for MRSA infections. Performed at [facility], [place] [address] [place], [place] [address]   Culture, blood (x 2)     Status: None   Collection Time: 02/24/18  9:10 PM  Result Value Ref Range Status   Specimen Description BLOOD RIGHT HAND  Final   Special Requests   Final    BOTTLES DRAWN AEROBIC AND ANAEROBIC Blood Culture results may not be optimal due to an excessive volume of blood received in culture bottles   Culture   Final    NO GROWTH 5 DAYS Performed at [facility], [place] [address] [place], [place] [address]    Report Status 03/01/2018 FINAL  Final  Culture, blood (x 2)     Status: None   Collection Time: 02/24/18  9:21 PM  Result Value Ref Range Status   Specimen Description BLOOD RIGHT HAND  Final   Special Requests   Final    BOTTLES DRAWN AEROBIC ONLY Blood Culture adequate volume   Culture   Final    NO GROWTH 5 DAYS Performed at [facility], [place] [address], [place], [place] [address]    Report Status 03/01/2018 FINAL  Final  Urine Culture      Status: None   Collection Time: 02/25/18  3:15 PM  Result Value Ref Range Status   Specimen Description URINE, RANDOM  Final   Special Requests NONE  Final   Culture   Final    NO GROWTH Performed at [facility], [place] [address], [place], [place] [address]  Report Status 02/26/2018 FINAL  Final     Labs: BNP (last 3 results) Recent Labs    08/01/17 1329  BNP 66.5   Basic Metabolic Panel: Recent Labs  Lab 02/24/18 1958  02/25/18 0725  02/26/18 0236 02/27/18 0205 02/28/18 0300 03/01/18 0213 03/02/18 0320  NA  --    < > 139   < > 138 138 137 140 137  K  --    < > 2.5*   < > 3.4* 2.8* 3.2* 4.0 4.9  CL  --    < > 96*   < > 102 101 101 102 99  CO2  --    < > 28   < > 26 32 28 27 29   GLUCOSE  --    < > 123*   < > 138* 155* 108* 80 121*  BUN  --    < > 42*   < > 36* 28* 23 23 25*  CREATININE  --    < > 2.48*   < > 2.19* 1.98* 1.80* 1.90* 1.83*  CALCIUM  --    < > 9.1   < > 8.8* 8.6* 8.9 9.2 9.3  MG 1.6*  --  1.8  --  2.2  --  2.2  --   --   PHOS  --   --  3.0  --   --  1.7* 2.8 2.7  --    < > = values in this interval not displayed.   Liver Function Tests: Recent Labs  Lab 02/24/18 1457 02/25/18 0725 02/27/18 0205 03/01/18 0213  AST 41 35  --   --   ALT 10 10  --   --   ALKPHOS 74 65  --   --   BILITOT 0.6 0.6  --   --   PROT 6.6 6.0*  --   --   ALBUMIN 3.1* 2.7* 2.3* 2.3*   No results for input(s): LIPASE, AMYLASE in the last 168 hours. No results for input(s): AMMONIA in the last 168 hours. CBC: Recent Labs  Lab 02/24/18 1457 02/25/18 0212 02/25/18 0725 02/26/18 0236 02/27/18 0205 03/01/18 0213  WBC 13.4* 9.0 10.4 8.6 9.2 10.1  NEUTROABS 10.5* 6.8  --  6.3 6.9 7.6  HGB 11.5* 9.5* 11.0* 9.7* 9.3* 9.6*  HCT 36.0 30.2* 35.1* 31.9* 29.6* 30.1*  MCV 94.2 94.1 93.6 95.2 94.0 93.8  PLT 182 163 166 196 199 248   Cardiac Enzymes: Recent Labs  Lab 02/24/18 2027 02/25/18 0212 02/25/18 0725  TROPONINI 0.05* 0.04* 0.03*   BNP: Invalid input(s):  POCBNP CBG: Recent Labs  Lab 03/01/18 1240 03/01/18 1709 03/01/18 2127 03/02/18 0809 03/02/18 1150  GLUCAP 77 94 141* 122* 107*   D-Dimer No results for input(s): DDIMER in the last 72 hours. Hgb A1c No results for input(s): HGBA1C in the last 72 hours. Lipid Profile No results for input(s): CHOL, HDL, LDLCALC, TRIG, CHOLHDL, LDLDIRECT in the last 72 hours. Thyroid function studies No results for input(s): TSH, T4TOTAL, T3FREE, THYROIDAB in the last 72 hours.  Invalid input(s): FREET3 Anemia work up No results for input(s): VITAMINB12, FOLATE, FERRITIN, TIBC, IRON, RETICCTPCT in the last 72 hours. Urinalysis    Component Value Date/Time   COLORURINE STRAW (A) 02/24/2018 [place] 02/24/2018 1457   LABSPEC 1.006 02/24/2018 1457   PHURINE 6.0 02/24/2018 1457   GLUCOSEU NEGATIVE 02/24/2018 1457   HGBUR MODERATE (A) 02/24/2018 [place] 02/24/2018 1457   KETONESUR  NEGATIVE 02/24/2018 1457   PROTEINUR NEGATIVE 02/24/2018 1457   UROBILINOGEN 0.2 10/05/2013 0059   NITRITE NEGATIVE 02/24/2018 1457   LEUKOCYTESUR TRACE (A) 02/24/2018 1457   Sepsis Labs Invalid input(s): PROCALCITONIN,  WBC,  LACTICIDVEN Microbiology Recent Results (from the past 240 hour(s))  MRSA PCR Screening     Status: None   Collection Time: 02/24/18  9:06 PM  Result Value Ref Range Status   MRSA by PCR NEGATIVE NEGATIVE Final    Comment:        The GeneXpert MRSA Assay (FDA approved for NASAL specimens only), is one component of a comprehensive MRSA colonization surveillance program. It is not intended to diagnose MRSA infection nor to guide or monitor treatment for MRSA infections. Performed at [facility], [place] [address] [place], [place] [address]   Culture, blood (x 2)     Status: None   Collection Time: 02/24/18  9:10 PM  Result Value Ref Range Status   Specimen Description BLOOD RIGHT HAND  Final   Special Requests   Final    BOTTLES DRAWN  AEROBIC AND ANAEROBIC Blood Culture results may not be optimal due to an excessive volume of blood received in culture bottles   Culture   Final    NO GROWTH 5 DAYS Performed at [facility], [place] [address], [place], [place] [address]    Report Status 03/01/2018 FINAL  Final  Culture, blood (x 2)     Status: None   Collection Time: 02/24/18  9:21 PM  Result Value Ref Range Status   Specimen Description BLOOD RIGHT HAND  Final   Special Requests   Final    BOTTLES DRAWN AEROBIC ONLY Blood Culture adequate volume   Culture   Final    NO GROWTH 5 DAYS Performed at [facility], [place] [address] [place], [place] [address]    Report Status 03/01/2018 FINAL  Final  Urine Culture     Status: None   Collection Time: 02/25/18  3:15 PM  Result Value Ref Range Status   Specimen Description URINE, RANDOM  Final   Special Requests NONE  Final   Culture   Final    NO GROWTH Performed at [facility], [place] [address], [place], [place] [address]    Report Status 02/26/2018 FINAL  Final     Patient was seen and examined on the day of discharge and was found to be in stable condition. Time coordinating discharge: 45 minutes including assessment and coordination of care, as well as examination of the patient.   SIGNED:  Dessa Phi, DO [facility] Pager [phone]  If 7PM-7AM, please contact night-coverage [email or web address] Password [facility] 03/02/2018, 12:39 PM

## 2018-03-02 NOTE — Clinical Social Work Note (Addendum)
PASARR still under review.  Dayton Scrape, [place] [phone]  11:55 am PASARR obtained: [id number]. Expires 1/4. Sent message to MD to notify.  Dayton Scrape, [place]

## 2018-03-02 NOTE — Progress Notes (Signed)
Discharged [place] [facility] [place] by [facility] ambulance, belongings taken home by family. Report given to staff. Transfer paper endorsed to ambulance staff

## 2018-03-02 NOTE — Consult Note (Signed)
   [facility] CM Inpatient Consult   03/02/2018  SAKINAH ROSAMOND 1940-08-11 [id number]    [facility] hospital liaison chart review follow up.  Noted Mrs. Portal will discharge to [facility] today.   Will make referral to [facility] LCSW. Mrs. Sword was active with [facility] prior to hospitalization.    Marthenia Rolling, MSN-Ed, RN,BSN [facility] Liaison [phone]

## 2018-03-02 NOTE — Care Management Important Message (Signed)
Important Message  Patient Details  Name: [name] MRN: [id number] Date of Birth: 10-12-1940   Medicare Important Message Given:  Yes    Demarcus Thielke P Dante 03/02/2018, 1:01 PM

## 2018-03-02 NOTE — Progress Notes (Signed)
Physical Therapy Treatment Patient Details Name: [name] MRN: [id number] DOB: 12/08/1940 Today's Date: 03/02/2018    History of Present Illness Patient admitted with sepsis. Pt underwent recent subtalar talonavicular fusion due to rt posterior tibial tendon insufficiency. PMH includes chronic CHF, CAD, DM, breast ca, CKD, dementia, HTN, neuropathy.,     PT Comments    Pt making slow, steady progress. Continue to recommend ST-SNF prior to return home.   Follow Up Recommendations  SNF;Supervision for mobility/OOB     Equipment Recommendations  None recommended by PT    Recommendations for Other Services       Precautions / Restrictions Precautions Precautions: Fall Required Braces or Orthoses: Other Brace Other Brace: CAM boot RLE--now that they have wrapped her legs it is better if she only has it on for transfers (tight) Restrictions Weight Bearing Restrictions: Yes RLE Weight Bearing: Non weight bearing    Mobility  Bed Mobility Overal bed mobility: Needs Assistance Bed Mobility: Supine to Sit     Supine to sit: HOB elevated;Min assist     General bed mobility comments: Assist to elevate trunk into sitting  Transfers Overall transfer level: Needs assistance Equipment used: Rolling walker (2 wheeled);None Transfers: Sit to/from [employer] Sit to Stand: Mod assist;+2 physical assistance   Squat pivot transfers: +2 physical assistance;Mod assist     General transfer comment: Assist to bring hips up into standing. Bed to bsc without assist device and required assist to bring hips up and to pivot as well as assist to keep NWB. Used walker for bsc to recliner pivot.   Ambulation/Gait             General Gait Details: Stood but unable to [employer] (Stroke Patients Only)       Balance Overall balance assessment: Needs assistance;History of Falls Sitting-balance support:  Feet supported;No upper extremity supported Sitting balance-Leahy Scale: Good     Standing balance support: During functional activity;Bilateral upper extremity supported Standing balance-Leahy Scale: Poor Standing balance comment: walker and +2 min assist for static standing                            Cognition Arousal/Alertness: Awake/alert Behavior During Therapy: WFL for tasks assessed/performed Overall Cognitive Status: Within Functional Limits for tasks assessed Area of Impairment: Safety/judgement;Memory                     Memory: Decreased short-term memory   Safety/Judgement: Decreased awareness of safety            Exercises      General Comments        Pertinent Vitals/Pain Pain Assessment: Faces Faces Pain Scale: Hurts little more Pain Location: RLE Pain Descriptors / Indicators: Sore;Aching;Throbbing Pain Intervention(s): Monitored during session;Repositioned    Home Living                      Prior Function            PT Goals (current goals can now be found in the care plan section) Progress towards PT goals: Progressing toward goals    Frequency    Min 2X/week      PT Plan Current plan remains appropriate    Co-evaluation  AM-PAC PT "6 Clicks" Mobility   Outcome Measure  Help needed turning from your back to your side while in a flat bed without using bedrails?: None Help needed moving from lying on your back to sitting on the side of a flat bed without using bedrails?: A Little Help needed moving to and from a bed to a chair (including a wheelchair)?: A Lot Help needed standing up from a chair using your arms (e.g., wheelchair or bedside chair)?: A Lot Help needed to walk in hospital room?: Total Help needed climbing 3-5 steps with a railing? : Total 6 Click Score: 13    End of Session Equipment Utilized During Treatment: Gait belt Activity Tolerance: Patient tolerated treatment  well Patient left: with call bell/phone within reach;with family/visitor present;in chair Nurse Communication: Mobility status PT Visit Diagnosis: Pain;Muscle weakness (generalized) (M62.81);Difficulty in walking, not elsewhere classified (R26.2);History of falling (Z91.81) Pain - Right/Left: Right Pain - part of body: Ankle and joints of foot     Time: 0910-0932 PT Time Calculation (min) (ACUTE ONLY): 22 min  Charges:  $Therapeutic Activity: 8-22 mins                     [place] Pager [phone] Office [place] 03/02/2018, 10:54 AM

## 2018-03-02 NOTE — Patient Outreach (Signed)
[facility]) Care Management  03/02/2018  CARISA BACKHAUS 04-22-1940 [id number]   Care Coordination   Patient with inpatient admission at [facility] 11/29, and for discharge to SNF on 12/5  In basket communications with Marthenia Rolling , [facility] .  Plan  Will close to complex care management nursing program.  Will await communication with [facility] LCSW at SNF disposition.   [name], RN, [place] Management Coordinator  [phone]- Mobile [phone]- Toll Free Main Office

## 2018-03-04 ENCOUNTER — Emergency Department (HOSPITAL_COMMUNITY)
Admission: EM | Admit: 2018-03-04 | Discharge: 2018-03-04 | Disposition: A | Discharge status: Home / Self Care | Payer: Medicare Other | Attending: Emergency Medicine | Admitting: Emergency Medicine

## 2018-03-04 ENCOUNTER — Encounter (HOSPITAL_COMMUNITY): Payer: Self-pay | Admitting: Emergency Medicine

## 2018-03-04 ENCOUNTER — Emergency Department (HOSPITAL_COMMUNITY): Payer: Medicare Other

## 2018-03-04 DIAGNOSIS — E1122 Type 2 diabetes mellitus with diabetic chronic kidney disease: Secondary | ICD-10-CM | POA: Insufficient documentation

## 2018-03-04 DIAGNOSIS — Z79899 Other long term (current) drug therapy: Secondary | ICD-10-CM | POA: Insufficient documentation

## 2018-03-04 DIAGNOSIS — I130 Hypertensive heart and chronic kidney disease with heart failure and stage 1 through stage 4 chronic kidney disease, or unspecified chronic kidney disease: Secondary | ICD-10-CM | POA: Diagnosis not present

## 2018-03-04 DIAGNOSIS — Z96632 Presence of left artificial wrist joint: Secondary | ICD-10-CM | POA: Insufficient documentation

## 2018-03-04 DIAGNOSIS — Z85828 Personal history of other malignant neoplasm of skin: Secondary | ICD-10-CM | POA: Insufficient documentation

## 2018-03-04 DIAGNOSIS — N184 Chronic kidney disease, stage 4 (severe): Secondary | ICD-10-CM | POA: Insufficient documentation

## 2018-03-04 DIAGNOSIS — R072 Precordial pain: Secondary | ICD-10-CM | POA: Diagnosis not present

## 2018-03-04 DIAGNOSIS — R079 Chest pain, unspecified: Secondary | ICD-10-CM | POA: Diagnosis not present

## 2018-03-04 DIAGNOSIS — I5033 Acute on chronic diastolic (congestive) heart failure: Secondary | ICD-10-CM | POA: Insufficient documentation

## 2018-03-04 DIAGNOSIS — Z794 Long term (current) use of insulin: Secondary | ICD-10-CM | POA: Insufficient documentation

## 2018-03-04 DIAGNOSIS — Z7902 Long term (current) use of antithrombotics/antiplatelets: Secondary | ICD-10-CM | POA: Insufficient documentation

## 2018-03-04 DIAGNOSIS — Z96652 Presence of left artificial knee joint: Secondary | ICD-10-CM | POA: Insufficient documentation

## 2018-03-04 DIAGNOSIS — Z853 Personal history of malignant neoplasm of breast: Secondary | ICD-10-CM | POA: Insufficient documentation

## 2018-03-04 DIAGNOSIS — F0390 Unspecified dementia without behavioral disturbance: Secondary | ICD-10-CM | POA: Insufficient documentation

## 2018-03-04 DIAGNOSIS — I2510 Atherosclerotic heart disease of native coronary artery without angina pectoris: Secondary | ICD-10-CM | POA: Diagnosis not present

## 2018-03-04 LAB — CBC
HCT: 36.8 % (ref 36.0–46.0)
Hemoglobin: 11.3 g/dL — ABNORMAL LOW (ref 12.0–15.0)
MCH: 30 pg (ref 26.0–34.0)
MCHC: 30.7 g/dL (ref 30.0–36.0)
MCV: 97.6 fL (ref 80.0–100.0)
Platelets: 400 10*3/uL (ref 150–400)
RBC: 3.77 MIL/uL — ABNORMAL LOW (ref 3.87–5.11)
RDW: 17.4 % — ABNORMAL HIGH (ref 11.5–15.5)
WBC: 9.1 10*3/uL (ref 4.0–10.5)
nRBC: 0 % (ref 0.0–0.2)

## 2018-03-04 LAB — BASIC METABOLIC PANEL
Anion gap: 13 (ref 5–15)
BUN: 20 mg/dL (ref 8–23)
CO2: 26 mmol/L (ref 22–32)
Calcium: 9.6 mg/dL (ref 8.9–10.3)
Chloride: 100 mmol/L (ref 98–111)
Creatinine, Ser: 1.7 mg/dL — ABNORMAL HIGH (ref 0.44–1.00)
GFR calc Af Amer: 33 mL/min — ABNORMAL LOW (ref >60)
GFR calc non Af Amer: 28 mL/min — ABNORMAL LOW (ref >60)
Glucose, Bld: 106 mg/dL — ABNORMAL HIGH (ref 70–99)
Potassium: 4.2 mmol/L (ref 3.5–5.1)
Sodium: 139 mmol/L (ref 135–145)

## 2018-03-04 LAB — I-STAT TROPONIN, ED
Troponin i, poc: 0.01 ng/mL (ref 0.00–0.08)
Troponin i, poc: 0 ng/mL (ref 0.00–0.08)

## 2018-03-04 NOTE — ED Provider Notes (Signed)
[facility]ERGENCY DEPARTMENT Provider Note   CSN: [id number] Arrival date & time: 03/04/18  1350     History   Chief Complaint Chief Complaint  Patient presents with  . Chest Pain    HPI [name] is a 77 y.o. female.  Patient with hx cad, presents with onset mid chest pain at rest this AM. Pain was constant, dull, moderate, non radiating, not pleuritic. Pain is currently resolved. Lasted a couple hours. Denies other recent cp or recent exertional chest pain. No unusual doe. Denies associated nv, diaphoresis or sob. No heartburn. Denies cough or uri symptoms. No fever or chills. No new leg pain or swelling.   The history is provided by the patient and the EMS personnel.  Chest Pain   Pertinent negatives include no abdominal pain, no back pain, no fever, no headaches, no shortness of breath and no vomiting.    Past Medical History:  Diagnosis Date  . Anemia    have received iron infusions  . Anxiety   . Arthritis     osteoarthritis  . Breast cancer ([place])   . Breast cancer in female [facility])    Bilateral  . CAD S/P percutaneous coronary angioplasty 2007; March 2011   [facility] 3.0 mm 20 mm postdilated 3.6 mm in early mid LAD; status post ISR Cutting Balloon PTCA and March 11 along with PCI of distal mid lesion with a 3.0 mm 12 mm MultiLink vision BMS; the proximal stent causes jailing of SP1 and SP2 with ostial 70-80% lesions  . Cancer (HCC)    Melanoma, Squamous cell Carcinoma  . CHF (congestive heart failure) ([place])   . Chronic kidney disease    stage 2   . 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 Dementia ([place])    mild  . Diabetes mellitus type 2 with neurological manifestations ([place])   . Dyslipidemia, goal LDL below 70   . Full dentures   . Genetic testing 12/03/2016   Germline genetic testing was performed through [employer]'s Common Hereditary Cancers Panel + [employer]'s Melanoma Panel. This custom panel includes  analysis of the following 51 genes: APC, ATM, AXIN2, BAP1, BARD1, BMPR1A, BRCA1, BRCA2, BRIP1, CDH1, CDK4, CDKN2A, CHEK2, CTNNA1, DICER1, EPCAM, GREM1, HOXB13, KIT, MEN1, MITF, MLH1, MSH2, MSH3, MSH6, MUTYH, NBN, NF1, NTHL1, PALB2, PDGFRA, PMS2, POLD1, POL  . GERD (gastroesophageal reflux disease)   . Headache(784.0)   . History of hematuria    Followed by Dr. Gaynelle Arabian  . Hypertension, essential, benign   . Nausea & vomiting 10/2015  . Peripheral neuropathy   . Stroke ([place])   . TIA (transient ischemic attack)    multiple in the past  . Wears glasses     Patient Active Problem List   Diagnosis Date Noted  . Acute on chronic diastolic CHF (congestive heart failure) ([place]) 02/28/2018  . SOB (shortness of breath)   . Hypotension   . Elevated troponin 02/25/2018  . Hypomagnesemia 02/25/2018  . Hypokalemia 02/24/2018  . Urinary retention 02/24/2018  . Sepsis ([place]) 02/24/2018  . Posterior tibial tendinitis of right leg 02/22/2018  . Posterior tibial tendinitis, right leg   . Right atrial mass   . Chronic diastolic heart failure ([place]) 08/01/2017  . Genetic testing 12/03/2016  . 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 Malignant neoplasm of upper-outer quadrant of left breast in female, estrogen receptor positive ([place]) 11/19/2016  . Chronic idiopathic gout involving toe of left foot without tophus 11/15/2016  .  Malignant 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, estrogen receptor positive ([place]) 11/09/2016  . Tendon tear, ankle, left, sequela   . Traumatic rupture of left anterior tibial tendon 04/20/2016  . Acute kidney injury superimposed on CKD ([place])   . 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 ([place])   . Headache, migraine   . Unexplained weight loss 11/17/2015  . 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 Restless legs syndrome 07/24/2015  . Mild cognitive impairment 01/08/2015  . Abnormality of gait 01/08/2015  .  Dizziness 12/19/2014  . Preoperative cardiovascular examination 12/11/2013  . GERD (gastroesophageal reflux disease) 10/06/2013  . Syncope 10/05/2013  . Dementia ([place]) 12/06/2012  . Anxiety 10/28/2012    Class: Acute  . Bilateral lower extremity edema 10/28/2012  . CAD S/P percutaneous coronary angioplasty   . Essential hypertension   . Dyslipidemia, goal LDL below 70     Past Surgical History:  Procedure Laterality Date  . ABDOMINAL HYSTERECTOMY    . ANKLE FUSION Right 02/22/2018   Procedure: RIGHT SUBTALAR AND TALONAVICULAR FUSION;  Surgeon: Newt Minion, MD;  Location: [facility];  Service: Orthopedics;  Laterality: Right;  . APPENDECTOMY    . BLADDER SUSPENSION    . BREAST LUMPECTOMY Bilateral 2018  . BREAST LUMPECTOMY WITH RADIOACTIVE SEED AND SENTINEL LYMPH NODE BIOPSY Bilateral 12/14/2016   Procedure: BILATERAL BREAST LUMPECTOMY WITH BILATERAL  RADIOACTIVE SEED AND BILATERAL AXILLARY  SENTINEL LYMPH NODE BIOPSY ERAS PATHWAY;  Surgeon: Alphonsa Overall, MD;  Location: [facility];  Service: General;  Laterality: Bilateral;  PECTORAL BLOCK  . CARDIAC CATHETERIZATION  02/24/2006   85% stenosis in the proximal portion of LAD-arrangements made for PCI on 02/25/2006  . CARDIAC CATHETERIZATION  02/25/2006   80% LAD lesion stented with a 3x48m [facility] stent resulting in reduction of 80% lesion to 0% residual  . CARDIAC CATHETERIZATION  04/26/2006   Medical management  . CARDIAC CATHETERIZATION  06/24/2009   60-70% re-stenosis in the proximal LAD. A 3.25x15 cutting balloon, 3 inflations - 14atm-38sec, 13atm-39sec, and 12atm-40sec reduced to less than 10%. 60% stenosis of the mid/distal LAD stented with a 3x124mMultilink stent.  . Marland KitchenARDIAC CATHETERIZATION  07/09/2009   Medical management  . CAROTID DOPPLER  06/24/2009   40-59% R ICA stenosis. No significant ICA stenosis noted  . CHOLECYSTECTOMY    . DILATION AND CURETTAGE OF UTERUS    . JOINT REPLACEMENT Left   . LESION REMOVAL Right 03/11/2015    Procedure:  EXCISION OF RIGHT PRE TIBIAL LESION;  Surgeon: JaJudeth HornMD;  [facility] Service: General;  Laterality: Right;  . MASS EXCISION Left 06/10/2014   Procedure: EXCISION LEFT LOWER LEG LESION;  Surgeon: JaDoreen SalvageMD;  [place] [facility] Service: General;  Laterality: Left;  . Marland KitchenASS EXCISION Left 09/05/2015   Procedure: EXCISION OF LEFT FOREARM  MASS;  Surgeon: JaJudeth HornMD;  [place] [facility] Service: General;  Laterality: Left;  . NM MYOVIEW LTD  11/16/2011   5 beats a PVC during recovery.  No skin or infarction.  Reached 5 METs, EKG negative for ischemia   . NM MYOVIEW LTD  10/2011   No ischemia or infarction.  Normal EF.  LOW RISK. (Short bursts of 5 beats NSVT during recovery otherwise normal)  . SHOULDER ARTHROSCOPY  10/15  . SHOULDER ARTHROSCOPY  1995   left  . TEE WITHOUT CARDIOVERSION N/A 08/03/2017  Procedure: TRANSESOPHAGEAL ECHOCARDIOGRAM (TEE);  Surgeon: Skeet Latch, MD;  Location: [facility];  Service: Cardiovascular;; EF 60-65% with no wall motion normalities.  No atrial thrombus noted.  Lightly findings consistent with a lipomatous hypertrophy of the atrial septum.   . TENDON REPAIR Left 05/12/2016   Procedure: Left Anterior Tibial Tendon Reconstruction;  Surgeon: Newt Minion, MD;  Location: [facility];  Service: Orthopedics;  Laterality: Left;  . TOTAL KNEE ARTHROPLASTY  2005   left  . TRANSESOPHAGEAL ECHOCARDIOGRAM  08/03/2016   : EF 60-65% no wall motion normalities.  No atrial thrombus.  Lipomatous hypertrophy of the atrial septum.  No mass noted.  . TRANSTHORACIC ECHOCARDIOGRAM  10/2015   EF 55-60 %. Mild LVH. Mild pulmonary hypertension. Normal valves.  . TRANSTHORACIC ECHOCARDIOGRAM  07/2017   Vigorous LV function.  EF 65-70%.  Mild diastolic dysfunction with no RWMA. GR 1 DD.  Mild aortic sclerosis/no stenosis.  Questionable right atrial mass discounted with TEE)   . WRIST ARTHROPLASTY  2010     cancer lt wrist     OB History   None      Home Medications    Prior to Admission medications   Medication Sig Start Date End Date Taking? Authorizing Provider  allopurinol (ZYLOPRIM) 100 MG tablet Take 1 tablet (100 mg total) by mouth 2 (two) times daily. 01/03/18   Newt Minion, MD  bumetanide (BUMEX) 2 MG tablet Take 1 tablet (2 mg total) by mouth 2 (two) times daily. 03/02/18   Dessa Phi, DO  Calcium Carb-Cholecalciferol (CALCIUM 600+D3) 600-800 MG-UNIT TABS Take 1 tablet by mouth daily.    [provider]  clopidogrel (PLAVIX) 75 MG tablet TAKE 1 TABLET DAILY Patient taking differently: Take 75 mg by mouth daily.  01/18/18   Leonie Man, MD  COD LIVER OIL PO Take 415 mg by mouth daily.    [provider]  colchicine 0.6 MG tablet Take 1 tablet (0.6 mg total) by mouth daily. PRN gout flare Patient taking differently: Take 0.6 mg by mouth daily as needed (for foot pain/gout flare up x 3 days then discontinue).  01/19/18   Newt Minion, MD  Cranberry 500 MG TABS Take 500 mg by mouth every evening.    [provider]  diazepam (VALIUM) 5 MG tablet Take 1 tablet (5 mg total) by mouth 2 (two) times daily as needed for anxiety. 03/02/18   Dessa Phi, DO  exemestane (AROMASIN) 25 MG tablet TAKE 1 TABLET DAILY AFTER BREAKFAST Patient taking differently: Take 25 mg by mouth daily.  01/16/18   Truitt Merle, MD  gabapentin (NEURONTIN) 300 MG capsule Take 1 capsule (300 mg total) by mouth 2 (two) times daily. Take 1 during the day if needed and 2 at night Patient taking differently: Take 300 mg by mouth 3 (three) times daily. Morning, lunch, & bedtime. 09/01/17   Dennie Bible, NP  insulin NPH Human (HUMULIN N,NOVOLIN N) 100 UNIT/ML injection Inject 20-25 Units into the skin See admin instructions. Inject 20 units in the morning & inject 25 units after supper.    [provider]  Javier Docker Oil 500 MG CAPS Take 500 mg by mouth daily.     [provider]  Methylfol-Methylcob-Acetylcyst (CEREFOLIN NAC) 6-2-600 MG TABS TAKE 1 CAPLET BY MOUTH ONCE DAILY. Patient taking differently: Take 1 tablet by mouth daily.  07/05/17   Garvin Fila, MD  metoprolol succinate (TOPROL-XL) 25 MG 24 hr tablet Take 0.5 tablets (12.5 mg  total) by mouth every evening. 03/02/18   Dessa Phi, DO  midodrine (PROAMATINE) 5 MG tablet Take 1 tablet (5 mg total) by mouth 3 (three) times daily with meals. 03/02/18   Dessa Phi, DO  Nitroglycerin (NITROMIST) 400 MCG/SPRAY AERS Place 1 spray onto the tongue every 5 (five) minutes x 3 doses as needed (chest pain.).    [provider]  oxyCODONE-acetaminophen (PERCOCET/ROXICET) 5-325 MG tablet Take 1 tablet by mouth every 4 (four) hours as needed for up to 5 days. 03/02/18 03/07/18  Dessa Phi, DO  pantoprazole (PROTONIX) 40 MG tablet TAKE 1 TABLET DAILY (CONTACT OFFICE FOR ADDITIONAL REFILLS, FINAL ATTEMPT) Patient taking differently: Take 40 mg by mouth daily before breakfast.  02/02/16   Leonie Man, MD  potassium chloride SA (K-DUR,KLOR-CON) 20 MEQ tablet Take 40 mEq by mouth 2 (two) times daily.     [provider]  pramipexole (MIRAPEX) 0.25 MG tablet TAKE 1 TABLET AT 4:00 P.M. AND 2 TABLETS (0.5 MG) AT BEDTIME Patient taking differently: Take 0.25-0.5 mg by mouth See admin instructions. TAKE 0.25  AT 4:00 P.M. AND   (0.5 MG) AT BEDTIME 10/17/17   Garvin Fila, MD  Resveratrol (RESERVAPAK PO) Take 1 tablet by mouth every evening.     [provider]  rosuvastatin (CRESTOR) 20 MG tablet Take 20 mg by mouth daily with supper.     [provider]  Ubiquinol (QUNOL COQ10/UBIQUINOL/MEGA) 100 MG CAPS Take 100 mg by mouth every evening. CoQ10, Qunol     [provider]    Family History Family History  Problem Relation Age of Onset  . Diabetes Mother   . Hypertension Mother   . Kidney disease Mother   . Hypertension Father   . Emphysema Father    . Heart attack Father   . Heart disease Father   . Arthritis Sister   . Diabetes Sister   . Hypertension Sister   . Breast cancer Sister 74       treated with mastectomy  . Cervical cancer Sister 62  . Hypertension Brother   . Hyperlipidemia Brother   . Diabetes Brother   . Stroke Brother   . Diabetes Sister   . Hypertension Sister   . Stomach cancer Sister 47  . Hypertension Brother   . ALS Brother        d.62s  . Breast cancer Daughter 35       triple negative treated with lumpectomy, chemo, radiation  . Heart attack Daughter 50       d.45  . COPD Daughter   . Cancer Paternal Aunt        unspecified type  . Cancer Paternal Uncle        unspecified type  . Cancer Paternal Uncle        unspecified type    Social History Social History   Tobacco Use  . Smoking status: Never Smoker  . Smokeless tobacco: Never Used  Substance Use Topics  . Alcohol use: No  . Drug use: No     Allergies   Ultram [tramadol]; Lipitor [atorvastatin]; Lyrica [pregabalin]; Reglan [metoclopramide]; and Nsaids   Review of Systems Review of Systems  Constitutional: Negative for fever.  HENT: Negative for sore throat.   Eyes: Negative for redness.  Respiratory: Negative for shortness of breath.   Cardiovascular: Positive for chest pain.  Gastrointestinal: Negative for abdominal pain and vomiting.  Genitourinary: Negative for flank pain.  Musculoskeletal: Negative for back pain and  neck pain.  Skin: Negative for rash.  Neurological: Negative for headaches.  Hematological: Does not bruise/bleed easily.  Psychiatric/Behavioral: Negative for confusion.     Physical Exam Updated Vital Signs BP 117/64   Pulse 85   Temp 97.7 F (36.5 C) (Oral)   Resp 15   Ht 1.676 m ('5\' 6"'$ )   Wt 86.8 kg   SpO2 94%   BMI 30.89 kg/m   Physical Exam  Constitutional: She appears well-developed and well-nourished.  HENT:  Mouth/Throat: Oropharynx is clear and moist.  Eyes: Conjunctivae are  normal. No scleral icterus.  Neck: Neck supple. No tracheal deviation present.  Cardiovascular: Normal rate, regular rhythm, normal heart sounds and intact distal pulses. Exam reveals no gallop and no friction rub.  No murmur heard. Pulmonary/Chest: Effort normal and breath sounds normal. No respiratory distress. She exhibits no tenderness.  Abdominal: Soft. Normal appearance and bowel sounds are normal. She exhibits no distension and no mass. There is no tenderness. There is no guarding.  Musculoskeletal: She exhibits no tenderness.  No asymmetric leg swelling, pain or tenderness.   Neurological: She is alert.  Skin: Skin is warm and dry. No rash noted.  Psychiatric: She has a normal mood and affect.  Nursing note and vitals reviewed.    ED Treatments / Results  Labs (all labs ordered are listed, but only abnormal results are displayed) Results for orders placed or performed during the hospital encounter of 03/04/18  CBC  Result Value Ref Range   WBC 9.1 4.0 - 10.5 K/uL   RBC 3.77 (L) 3.87 - 5.11 MIL/uL   Hemoglobin 11.3 (L) 12.0 - 15.0 g/dL   HCT 36.8 36.0 - 46.0 %   MCV 97.6 80.0 - 100.0 fL   MCH 30.0 26.0 - 34.0 pg   MCHC 30.7 30.0 - 36.0 g/dL   RDW 17.4 (H) 11.5 - 15.5 %   Platelets 400 150 - 400 K/uL   nRBC 0.0 0.0 - 0.2 %  I-stat troponin, ED  Result Value Ref Range   Troponin i, poc 0.01 0.00 - 0.08 ng/mL   Comment 3           Dg Chest 2 View  Result Date: 02/24/2018 CLINICAL DATA:  Episode of oxygen desaturation today. EXAM: CHEST - 2 VIEW COMPARISON:  PA and lateral chest 08/01/2017. FINDINGS: The lungs are clear. Bosselation of the right hemidiaphragm is noted. No pneumothorax or pleural effusion. Heart size is normal. Aortic atherosclerosis is seen. No acute or focal bony abnormality. IMPRESSION: No acute disease. Electronically Signed   By: Inge Rise M.D.   On: 02/24/2018 18:00   Nm Pulmonary Vent And Perf (v/q Scan)  Result Date: 02/25/2018 CLINICAL  DATA:  Clinical concern for pulmonary embolus. Low oxygen saturation. EXAM: NUCLEAR MEDICINE VENTILATION - PERFUSION LUNG SCAN TECHNIQUE: Ventilation images were obtained in multiple projections using inhaled aerosol Tc-63mDTPA. Perfusion images were obtained in multiple projections after intravenous injection of Tc-926mAA. RADIOPHARMACEUTICALS:  31.2 mCi of Tc-9965mPA aerosol inhalation and 4.2 mCi Tc99m26m IV COMPARISON:  Chest radiograph 02/24/2017 FINDINGS: Ventilation: No focal ventilation defect. Mild clumping of the radiotracer in the central airways. Perfusion: No wedge shaped peripheral perfusion defects to suggest acute pulmonary embolism. IMPRESSION: No evidence of pulmonary embolus. Mild clumping of the inhaled radiotracer, suggestive of an element of COPD. Electronically Signed   By: DobrFidela Salisbury.   On: 02/25/2018 11:20   Dg Chest Port 1 View  Result Date: 03/04/2018 CLINICAL DATA:  Acute onset left-sided chest pain radiating to back. EXAM: PORTABLE CHEST 1 VIEW COMPARISON:  02/28/2018 FINDINGS: The heart size and mediastinal contours are within normal limits. Aortic atherosclerosis. Both lungs are clear. The visualized skeletal structures are unremarkable. IMPRESSION: No active disease. Electronically Signed   By: Earle Gell M.D.   On: 03/04/2018 14:36   Dg Chest Port 1 View  Result Date: 02/28/2018 CLINICAL DATA:  Shortness of breath EXAM: PORTABLE CHEST 1 VIEW COMPARISON:  02/24/2018 FINDINGS: Borderline heart size. Bibasilar opacities, likely atelectasis. Aortic atherosclerosis. Mild vascular congestion. No effusions or acute bony abnormality. IMPRESSION: Borderline heart size.  Mild vascular congestion. Bibasilar atelectasis. Electronically Signed   By: Rolm Baptise M.D.   On: 02/28/2018 02:37   Vas [place] Lower Extremity Venous (dvt)  Result Date: 02/25/2018  Lower Venous Study Indications: Pain.  Risk Factors: Sepsis. Post op right subtalar and talonavicular fusion  02/22/18. Limitations: Pitting edema. Comparison Study: Prior negative study done 08/31/12, is available for                   comparison. Performing Technologist: Sharion Dove RVS  Examination Guidelines: A complete evaluation includes B-mode imaging, spectral Doppler, color Doppler, and power Doppler as needed of all accessible portions of each vessel. Bilateral testing is considered an integral part of a complete examination. Limited examinations for reoccurring indications may be performed as noted.  Right Venous Findings: +---------+---------------+---------+-----------+----------+----------+          CompressibilityPhasicitySpontaneityPropertiesSummary    +---------+---------------+---------+-----------+----------+----------+ CFV      Full           Yes      Yes                             +---------+---------------+---------+-----------+----------+----------+ SFJ      Full                                                    +---------+---------------+---------+-----------+----------+----------+ FV Prox  Full                                                    +---------+---------------+---------+-----------+----------+----------+ FV Mid   Full                                                    +---------+---------------+---------+-----------+----------+----------+ FV Distal               Yes      Yes                  visualized +---------+---------------+---------+-----------+----------+----------+ PFV      Full                                                    +---------+---------------+---------+-----------+----------+----------+ POP  Yes      Yes                             +---------+---------------+---------+-----------+----------+----------+ PTV      Full                                                    +---------+---------------+---------+-----------+----------+----------+ PERO     Full                                                     +---------+---------------+---------+-----------+----------+----------+  Left Venous Findings: +---------+---------------+---------+-----------+----------+----------+          CompressibilityPhasicitySpontaneityPropertiesSummary    +---------+---------------+---------+-----------+----------+----------+ CFV      Full           Yes      Yes                             +---------+---------------+---------+-----------+----------+----------+ SFJ      Full                                                    +---------+---------------+---------+-----------+----------+----------+ FV Prox  Full                                                    +---------+---------------+---------+-----------+----------+----------+ FV Mid   Full                                                    +---------+---------------+---------+-----------+----------+----------+ FV Distal               Yes      Yes                  visualized +---------+---------------+---------+-----------+----------+----------+ PFV      Full                                                    +---------+---------------+---------+-----------+----------+----------+ POP      Full           Yes      Yes                             +---------+---------------+---------+-----------+----------+----------+ PTV      Full                                                    +---------+---------------+---------+-----------+----------+----------+  PERO     Full                                                    +---------+---------------+---------+-----------+----------+----------+    Summary: Right: There is no evidence of deep vein thrombosis in the lower extremity. However, portions of this examination were limited- see technologist comments above. Left: There is no evidence of deep vein thrombosis in the lower extremity. However, portions of this examination were limited- see technologist  comments above.  *See table(s) above for measurements and observations. Electronically signed by Servando Snare MD on 02/25/2018 at 1:51:18 PM.    Final     EKG EKG Interpretation  Date/Time:  Saturday March 04 2018 13:55:04 EST Ventricular Rate:  86 PR Interval:    QRS Duration: 98 QT Interval:  392 QTC Calculation: 469 R Axis:   28 Text Interpretation:  Sinus rhythm Nonspecific ST abnormality Confirmed by Lajean Saver [phone]) on 03/04/2018 2:05:07 PM   Radiology Dg Chest Port 1 View  Result Date: 03/04/2018 CLINICAL DATA:  Acute onset left-sided chest pain radiating to back. EXAM: PORTABLE CHEST 1 VIEW COMPARISON:  02/28/2018 FINDINGS: The heart size and mediastinal contours are within normal limits. Aortic atherosclerosis. Both lungs are clear. The visualized skeletal structures are unremarkable. IMPRESSION: No active disease. Electronically Signed   By: Earle Gell M.D.   On: 03/04/2018 14:36    Procedures Procedures (including critical care time)  Medications Ordered in ED Medications - No data to display   Initial Impression / Assessment and Plan / ED Course  I have reviewed the triage vital signs and the nursing notes.  Pertinent labs & imaging results that were available during my care of the patient were reviewed by me and considered in my medical decision making (see chart for details).  Iv ns. Labs. Ecg. Cxr.  Reviewed nursing notes and prior charts for additional history.   cxr reviewed - no pna.  Labs reviewed - initial trop normal.  Pt remains chest pain free. Plan for delta troponin.   Signed out to Dr Jacinto Reap Sabra Heck - if delta trop neg/not increasing, and no recurrent chest pain, rec outpt f/u w cardiology this week. If increasing/pos will require admission.    Final Clinical Impressions(s) / ED Diagnoses   Final diagnoses:  None    ED Discharge Orders    None       Lajean Saver, MD 03/04/18 1458

## 2018-03-04 NOTE — ED Triage Notes (Signed)
Pt arrives via EMS from [facility] with reports of sudden onset central and left chest pain that radiated to back. 8/10 pain, nitro given and pain decreased to 4/10. 324 ASA and 2 more nitro given. Pain now 1/10. 4 mg zofran given by EMS

## 2018-03-04 NOTE — Discharge Instructions (Addendum)
It was our pleasure to provide your ER care today - we hope that you feel better.  Follow up with cardiologist in the coming week - call office Monday AM to arrange appointment.  Return to ER if worse, recurrent or persistent chest pain, trouble breathing, other concern.

## 2018-03-04 NOTE — ED Provider Notes (Signed)
Second troponin was negative, as per the plan from Dr. Delice Lesch on the patient is stable for discharge.  Thankfully her testing has been totally unremarkable, she is very stable, she feels better, her blood pressure is normal, she is not tachypneic on my exam and her oxygen is in the mid 90% range.  Her labs have been overall unremarkable and her creatinine seems to be baseline.     Noemi Chapel, MD 03/04/18 [phone]

## 2018-03-05 DIAGNOSIS — N184 Chronic kidney disease, stage 4 (severe): Secondary | ICD-10-CM | POA: Diagnosis not present

## 2018-03-05 DIAGNOSIS — R0789 Other chest pain: Secondary | ICD-10-CM | POA: Diagnosis not present

## 2018-03-05 DIAGNOSIS — M79671 Pain in right foot: Secondary | ICD-10-CM | POA: Diagnosis not present

## 2018-03-05 DIAGNOSIS — E1121 Type 2 diabetes mellitus with diabetic nephropathy: Secondary | ICD-10-CM | POA: Diagnosis not present

## 2018-03-05 DIAGNOSIS — I5032 Chronic diastolic (congestive) heart failure: Secondary | ICD-10-CM | POA: Diagnosis not present

## 2018-03-05 DIAGNOSIS — A4189 Other specified sepsis: Secondary | ICD-10-CM | POA: Diagnosis not present

## 2018-03-06 ENCOUNTER — Encounter (INDEPENDENT_AMBULATORY_CARE_PROVIDER_SITE_OTHER): Payer: Medicare Other | Admitting: Ophthalmology

## 2018-03-08 ENCOUNTER — Ambulatory Visit (INDEPENDENT_AMBULATORY_CARE_PROVIDER_SITE_OTHER): Payer: Self-pay

## 2018-03-08 ENCOUNTER — Encounter (INDEPENDENT_AMBULATORY_CARE_PROVIDER_SITE_OTHER): Payer: Self-pay | Admitting: Physician Assistant

## 2018-03-08 ENCOUNTER — Ambulatory Visit (INDEPENDENT_AMBULATORY_CARE_PROVIDER_SITE_OTHER): Payer: No Typology Code available for payment source | Admitting: Physician Assistant

## 2018-03-08 VITALS — Ht 66.0 in | Wt 191.0 lb

## 2018-03-08 DIAGNOSIS — Z981 Arthrodesis status: Secondary | ICD-10-CM

## 2018-03-08 DIAGNOSIS — R600 Localized edema: Secondary | ICD-10-CM

## 2018-03-08 DIAGNOSIS — M25571 Pain in right ankle and joints of right foot: Secondary | ICD-10-CM

## 2018-03-08 NOTE — Progress Notes (Signed)
Office Visit Note   Patient: [name]           Date of Birth: [birth year]           MRN: [id number] Visit Date: 03/08/2018              Requested by: Redmond School, [place], [place] [address] PCP: Redmond School, MD  Chief Complaint  Patient presents with  . Right Ankle - Routine Post Op    02/22/18 right subtalar and talonavicular fusion       HPI: The patient is a 77 year old woman here with her husband for postoperative follow-up following right subtalar and talonavicular fusion for right posterior tibial tendon insufficiency on 02/22/2018.  She did well overnight and was discharged but developed significant bilateral lower extremity edema, and worsening renal failure and hypotension and was readmitted from 11/29 through 03/02/2018.  She was then discharged to [facility] skilled nursing facility for rehabilitation.  She did go back to the emergency room again on 03/04/2018 with chest pain and work-up at this time was negative. She initially had the VAC dressing on after surgery and this was removed during her hospitalization.  She has been nonweightbearing over the right lower extremity . She reports she has been working with the physical therapist at the nursing facility nonweightbearing over the right lower extremity utilizing a walker.  She reports she is sleeping very poorly as they have her on a very small dose of Remeron and would like this increased to her usual home dose.  She also reports very poor p.o. intake.   Assessment & Plan: Visit Diagnoses:  1. Status post ankle fusion   2. Pain in right ankle and joints of right foot     Plan: We are going to rewrap bilateral lower extremities with Unna paste and Dynaflex multilayer compression wraps.  She should elevate the extremities as much as possible and continue nonweightbearing on the right foot.  She needs Glucerna or similar protein supplement twice daily.  She requested an increase in Remeron to 15  mg 1.5 tablets at bedtime and this request was written on her orders to the nursing home along with all the above.  She will follow-up in approximately 1 week or sooner should she have difficulties in the interim.  Follow-Up Instructions: Return in about 8 days (around 03/16/2018).   Ortho Exam  Patient is alert, oriented, no adenopathy, well-dressed, normal affect, normal respiratory effort. Bilateral lower extremity compression wraps were removed.  Her edema is improved but she still has moderate edema residually especially of the right lower extremity foot and ankle.  The incisions are intact and sutures are in place.  There is no signs of cellulitis or infection currently.  She is able to wiggle her toes and neurovascularly intact distally.  Imaging: Xr Ankle Complete Right  Result Date: 03/08/2018 Right foot- Good position and alignment of subtalar and talonavicular joints with internal fixation in place.   No images are attached to the encounter.  Labs: Lab Results  Component Value Date   HGBA1C 7.1 (H) 02/24/2018   HGBA1C 7.1 (H) 02/22/2018   HGBA1C 5.7 (H) 12/07/2016   LABURIC 9.0 (H) 02/25/2018   LABURIC 10.2 (H) 02/16/2018   LABURIC 9.1 (H) 01/19/2018   REPTSTATUS 02/26/2018 FINAL 02/25/2018   CULT  02/25/2018    NO GROWTH Performed at [facility], [place] [address] [place], [place] [address]    [place] 12/31/2009  Lab Results  Component Value Date   ALBUMIN 2.3 (L) 03/01/2018   ALBUMIN 2.3 (L) 02/27/2018   ALBUMIN 2.7 (L) 02/25/2018   LABURIC 9.0 (H) 02/25/2018   LABURIC 10.2 (H) 02/16/2018   LABURIC 9.1 (H) 01/19/2018    Body mass index is 30.83 kg/m.  Orders:  Orders Placed This Encounter  Procedures  . XR Ankle Complete Right   No orders of the defined types were placed in this encounter.    Procedures: No procedures performed  Clinical Data: No additional findings.  ROS:  All other systems negative, except as  noted in the HPI. Review of Systems  Objective: Vital Signs: Ht '5\' 6"'$  (1.676 m)   Wt 191 lb (86.6 kg)   BMI 30.83 kg/m   Specialty Comments:  No specialty comments available.  PMFS History: Patient Active Problem List   Diagnosis Date Noted  . Acute on chronic diastolic CHF (congestive heart failure) ([place]) 02/28/2018  . SOB (shortness of breath)   . Hypotension   . Elevated troponin 02/25/2018  . Hypomagnesemia 02/25/2018  . Hypokalemia 02/24/2018  . Urinary retention 02/24/2018  . Sepsis ([place]) 02/24/2018  . Posterior tibial tendinitis of right leg 02/22/2018  . Posterior tibial tendinitis, right leg   . Right atrial mass   . Chronic diastolic heart failure ([place]) 08/01/2017  . Genetic testing 12/03/2016  . 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 Malignant neoplasm of upper-outer quadrant of left breast in female, estrogen receptor positive ([place]) 11/19/2016  . Chronic idiopathic gout involving toe of left foot without tophus 11/15/2016  . Malignant 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, estrogen receptor positive ([place]) 11/09/2016  . Tendon tear, ankle, left, sequela   . Traumatic rupture of left anterior tibial tendon 04/20/2016  . Acute kidney injury superimposed on CKD ([place])   . 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 Headache, migraine   . Unexplained weight loss 11/17/2015  . 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 Restless legs syndrome 07/24/2015  . Mild cognitive impairment 01/08/2015  . Abnormality of gait 01/08/2015  . Dizziness 12/19/2014  . Preoperative cardiovascular examination 12/11/2013  . GERD (gastroesophageal reflux disease) 10/06/2013  . Syncope 10/05/2013  . Dementia ([place]) 12/06/2012  . Anxiety 10/28/2012    Class: Acute  . Bilateral lower extremity edema 10/28/2012  . CAD S/P percutaneous coronary angioplasty   . Essential hypertension   . Dyslipidemia, goal LDL below  70    Past Medical History:  Diagnosis Date  . Anemia    have received iron infusions  . Anxiety   . Arthritis     osteoarthritis  . Breast cancer ([place])   . Breast cancer in female [facility])    Bilateral  . CAD S/P percutaneous coronary angioplasty 2007; March 2011   [facility] 3.0 mm 20 mm postdilated 3.6 mm in early mid LAD; status post ISR Cutting Balloon PTCA and March 11 along with PCI of distal mid lesion with a 3.0 mm 12 mm MultiLink vision BMS; the proximal stent causes jailing of SP1 and SP2 with ostial 70-80% lesions  . Cancer (HCC)    Melanoma, Squamous cell Carcinoma  . CHF (congestive heart failure) ([place])   . Chronic kidney disease    stage 2   . 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 Dementia ([place])    mild  . Diabetes mellitus type 2 with neurological manifestations (  HCC)   . Dyslipidemia, goal LDL below 70   . Full dentures   . Genetic testing 12/03/2016   Germline genetic testing was performed through [facility]'s Common Hereditary Cancers Panel + [facility]'s Melanoma Panel. This custom panel includes analysis of the following 51 genes: APC, ATM, AXIN2, BAP1, BARD1, BMPR1A, BRCA1, BRCA2, BRIP1, CDH1, CDK4, CDKN2A, CHEK2, CTNNA1, DICER1, EPCAM, GREM1, HOXB13, KIT, MEN1, MITF, MLH1, MSH2, MSH3, MSH6, MUTYH, NBN, NF1, NTHL1, PALB2, PDGFRA, PMS2, POLD1, POL  . GERD (gastroesophageal reflux disease)   . Headache(784.0)   . History of hematuria    Followed by Dr. Gaynelle Arabian  . Hypertension, essential, benign   . Nausea & vomiting 10/2015  . Peripheral neuropathy   . Stroke ([place])   . TIA (transient ischemic attack)    multiple in the past  . Wears glasses     Family History  Problem Relation Age of Onset  . Diabetes Mother   . Hypertension Mother   . Kidney disease Mother   . Hypertension Father   . Emphysema Father   . Heart attack Father   . Heart disease Father   . Arthritis Sister   . Diabetes Sister   . Hypertension Sister   .  Breast cancer Sister 65       treated with mastectomy  . Cervical cancer Sister 16  . Hypertension Brother   . Hyperlipidemia Brother   . Diabetes Brother   . Stroke Brother   . Diabetes Sister   . Hypertension Sister   . Stomach cancer Sister 41  . Hypertension Brother   . ALS Brother        d.62s  . Breast cancer Daughter 77       triple negative treated with lumpectomy, chemo, radiation  . Heart attack Daughter 26       d.45  . COPD Daughter   . Cancer Paternal Aunt        unspecified type  . Cancer Paternal Uncle        unspecified type  . Cancer Paternal Uncle        unspecified type    Past Surgical History:  Procedure Laterality Date  . ABDOMINAL HYSTERECTOMY    . ANKLE FUSION Right 02/22/2018   Procedure: RIGHT SUBTALAR AND TALONAVICULAR FUSION;  Surgeon: Newt Minion, MD;  Location: [facility];  Service: Orthopedics;  Laterality: Right;  . APPENDECTOMY    . BLADDER SUSPENSION    . BREAST LUMPECTOMY Bilateral 2018  . BREAST LUMPECTOMY WITH RADIOACTIVE SEED AND SENTINEL LYMPH NODE BIOPSY Bilateral 12/14/2016   Procedure: BILATERAL BREAST LUMPECTOMY WITH BILATERAL  RADIOACTIVE SEED AND BILATERAL AXILLARY  SENTINEL LYMPH NODE BIOPSY ERAS PATHWAY;  Surgeon: Alphonsa Overall, MD;  Location: [facility];  Service: General;  Laterality: Bilateral;  PECTORAL BLOCK  . CARDIAC CATHETERIZATION  02/24/2006   85% stenosis in the proximal portion of LAD-arrangements made for PCI on 02/25/2006  . CARDIAC CATHETERIZATION  02/25/2006   80% LAD lesion stented with a 3x62m Liberte stent resulting in reduction of 80% lesion to 0% residual  . CARDIAC CATHETERIZATION  04/26/2006   Medical management  . CARDIAC CATHETERIZATION  06/24/2009   60-70% re-stenosis in the proximal LAD. A 3.25x15 cutting balloon, 3 inflations - 14atm-38sec, 13atm-39sec, and 12atm-40sec reduced to less than 10%. 60% stenosis of the mid/distal LAD stented with a 3x137mMultilink stent.  . Marland KitchenARDIAC CATHETERIZATION  07/09/2009    Medical management  . CAROTID DOPPLER  06/24/2009   40-59% R ICA stenosis.  No significant ICA stenosis noted  . CHOLECYSTECTOMY    . DILATION AND CURETTAGE OF UTERUS    . JOINT REPLACEMENT Left   . LESION REMOVAL Right 03/11/2015   Procedure:  EXCISION OF RIGHT PRE TIBIAL LESION;  Surgeon: Judeth Horn, MD;  Location: [facility];  Service: General;  Laterality: Right;  . MASS EXCISION Left 06/10/2014   Procedure: EXCISION LEFT LOWER LEG LESION;  Surgeon: Doreen Salvage, MD;  Location: [facility];  Service: General;  Laterality: Left;  Marland Kitchen MASS EXCISION Left 09/05/2015   Procedure: EXCISION OF LEFT FOREARM  MASS;  Surgeon: Judeth Horn, MD;  Location: [facility];  Service: General;  Laterality: Left;  . NM MYOVIEW LTD  11/16/2011   5 beats a PVC during recovery.  No skin or infarction.  Reached 5 METs, EKG negative for ischemia   . NM MYOVIEW LTD  10/2011   No ischemia or infarction.  Normal EF.  LOW RISK. (Short bursts of 5 beats NSVT during recovery otherwise normal)  . SHOULDER ARTHROSCOPY  10/15  . SHOULDER ARTHROSCOPY  1995   left  . TEE WITHOUT CARDIOVERSION N/A 08/03/2017   Procedure: TRANSESOPHAGEAL ECHOCARDIOGRAM (TEE);  Surgeon: Skeet Latch, MD;  Location: [facility];  Service: Cardiovascular;; EF 60-65% with no wall motion normalities.  No atrial thrombus noted.  Lightly findings consistent with a lipomatous hypertrophy of the atrial septum.   . TENDON REPAIR Left 05/12/2016   Procedure: Left Anterior Tibial Tendon Reconstruction;  Surgeon: Newt Minion, MD;  Location: [facility];  Service: Orthopedics;  Laterality: Left;  . TOTAL KNEE ARTHROPLASTY  2005   left  . TRANSESOPHAGEAL ECHOCARDIOGRAM  08/03/2016   : EF 60-65% no wall motion normalities.  No atrial thrombus.  Lipomatous hypertrophy of the atrial septum.  No mass noted.  . TRANSTHORACIC ECHOCARDIOGRAM  10/2015   EF 55-60 %. Mild LVH. Mild pulmonary hypertension. Normal valves.  .  TRANSTHORACIC ECHOCARDIOGRAM  07/2017   Vigorous LV function.  EF 65-70%.  Mild diastolic dysfunction with no RWMA. GR 1 DD.  Mild aortic sclerosis/no stenosis.  Questionable right atrial mass discounted with TEE)   . WRIST ARTHROPLASTY  2010   cancer lt wrist   Social History   Occupational History  . Occupation: retired    Fish farm manager: RETIRED  Tobacco Use  . Smoking status: Never Smoker  . Smokeless tobacco: Never Used  Substance and Sexual Activity  . Alcohol use: No  . Drug use: No  . Sexual activity: Never

## 2018-03-15 ENCOUNTER — Other Ambulatory Visit: Payer: Self-pay | Admitting: *Deleted

## 2018-03-15 NOTE — Patient Outreach (Signed)
[facility]) Care Management  03/15/2018  [name] [birth year] [id number]   CSW made contact with SNF rep today who indicates no dc date has been set. "pt has 4 steps to enter into her home" so they continue to work on PT progression for this goal.  Pt plans to return home with spouse once released.   CSW will plan a call or visit to SNF in the next 5-7 days.     Eduard Clos, MSW, [place] Worker  [place] [phone]

## 2018-03-15 NOTE — Patient Outreach (Addendum)
[facility]) Care Management  03/15/2018  [name] 1940/07/27 [id number]   Met with d/c planner Ebony Hail at [facility] prior to visiting this patient .  Ebony Hail made me aware this patient's discharge plan is to stay at [facility] facility through Christmas Eve but wants to have a pass to go home then come back once holidays are over. They are continuing to try to work out the details.  Discharge planner is aware patient is involved with [facility] Care Management. Recent surgical appointment prolonged patient's NWB status to right foot for approx 4 more weeks.  Discharge planner states patient has a very supportive and involved family.  Met with patient at the bedside.  Patient was resting comfortably in the recliner. Patient states she feels she has been making progress with therapy.  Spoke about her discharge plan and needs.  Patient stated her biggest need was to have a ramp built onto her home.  We discussed different options and I let her know I would ask [facility] SW if she had any resources surrounding this.   Patient denies need for transportation and she has her medications delivered and she fills her own pill Environmental education officer.   Patient gave verbal permission to speak with her spouse [name] [phone] and her cell number is [phone].  Patient is active with [facility] Management services and is being followed by [facility] and [facility] LCSW.   Made [name] UM Team Member [facility] aware patient is active with [facility] CM.  Plan to [facility] LCSW to make her aware of patient's need for a ramp.   Rutherford Limerick RN, BSN [place] Acute Care Coordinator [phone]) Business Mobile [phone]) Toll free office

## 2018-03-16 ENCOUNTER — Encounter (INDEPENDENT_AMBULATORY_CARE_PROVIDER_SITE_OTHER): Payer: Self-pay | Admitting: Physician Assistant

## 2018-03-16 ENCOUNTER — Ambulatory Visit (INDEPENDENT_AMBULATORY_CARE_PROVIDER_SITE_OTHER): Payer: Medicare Other | Admitting: Physician Assistant

## 2018-03-16 ENCOUNTER — Ambulatory Visit (INDEPENDENT_AMBULATORY_CARE_PROVIDER_SITE_OTHER): Payer: Medicare Other

## 2018-03-16 VITALS — Ht 66.0 in | Wt 191.0 lb

## 2018-03-16 DIAGNOSIS — Z981 Arthrodesis status: Secondary | ICD-10-CM

## 2018-03-16 DIAGNOSIS — N189 Chronic kidney disease, unspecified: Secondary | ICD-10-CM

## 2018-03-16 DIAGNOSIS — E1142 Type 2 diabetes mellitus with diabetic polyneuropathy: Secondary | ICD-10-CM

## 2018-03-17 ENCOUNTER — Encounter (INDEPENDENT_AMBULATORY_CARE_PROVIDER_SITE_OTHER): Payer: Self-pay | Admitting: Physician Assistant

## 2018-03-17 NOTE — Progress Notes (Signed)
Office Visit Note   Patient: [name]           Date of Birth: 12-08-1940           MRN: [id number] Visit Date: 03/16/2018              Requested by: Redmond School, [place], [place] [address] PCP: [place] [facility], MD  Chief Complaint  Patient presents with  . Left Leg - Pain, Follow-up  . Right Leg - Pain, Follow-up      HPI: The patient is a 77 year old woman here with her husband for postoperative follow-up following right subtalar and talonavicular fusion for right posterior tibial tendon insufficiency on 02/22/2018.  She remains at [facility] facility for rehabilitation. She is nonweightbearing over the right leg.  She reports continued improvement in her pain level and edema.  Assessment & Plan: Visit Diagnoses:  1. Status post ankle fusion   2. Type 2 diabetes mellitus with diabetic polyneuropathy, unspecified whether long term insulin use ([place])   3. Chronic kidney disease, unspecified CKD stage     Plan: Sutures were harvested this visit.  She should continue to be nonweightbearing in her fracture boot and can use either the higher boot or the lower profile boot per her preference.  She has been cleared to begin some movement of the ankle.  She is going to resume wearing her Vive compression sock.  She will follow-up here in 3 weeks.  Follow-Up Instructions: Return in about 3 weeks (around 04/06/2018).   Ortho Exam  Patient is alert, oriented, no adenopathy, well-dressed, normal affect, normal respiratory effort. The right ankle and foot incisions are healing well and sutures were removed this visit.  She has mild localized edema about the ankle.  Overall her edema is much improved.  She is neurovascularly intact distally.  Imaging: No results found. No images are attached to the encounter.  Labs: Lab Results  Component Value Date   HGBA1C 7.1 (H) 02/24/2018   HGBA1C 7.1 (H) 02/22/2018   HGBA1C 5.7 (H) 12/07/2016   LABURIC 9.0 (H)  02/25/2018   LABURIC 10.2 (H) 02/16/2018   LABURIC 9.1 (H) 01/19/2018   REPTSTATUS 02/26/2018 FINAL 02/25/2018   CULT  02/25/2018    NO GROWTH Performed at [facility], [place] [address] [place], [place] [address]    LABORGA ESCHERICHIA COLI 12/31/2009     Lab Results  Component Value Date   ALBUMIN 2.3 (L) 03/01/2018   ALBUMIN 2.3 (L) 02/27/2018   ALBUMIN 2.7 (L) 02/25/2018   LABURIC 9.0 (H) 02/25/2018   LABURIC 10.2 (H) 02/16/2018   LABURIC 9.1 (H) 01/19/2018    Body mass index is 30.83 kg/m.  Orders:  Orders Placed This Encounter  Procedures  . XR Foot Complete Right   No orders of the defined types were placed in this encounter.    Procedures: No procedures performed  Clinical Data: No additional findings.  ROS:  All other systems negative, except as noted in the HPI. Review of Systems  Objective: Vital Signs: Ht _0  (1.676 m)   Wt 191 lb (86.6 kg)   BMI 30.83 kg/m   Specialty Comments:  No specialty comments available.  PMFS History: Patient Active Problem List   Diagnosis Date Noted  . Acute on chronic diastolic CHF (congestive heart failure) ([place]) 02/28/2018  . SOB (shortness of breath)   . Hypotension   . Elevated troponin 02/25/2018  . Hypomagnesemia 02/25/2018  . Hypokalemia 02/24/2018  .  Urinary retention 02/24/2018  . Sepsis ([place]) 02/24/2018  . Posterior tibial tendinitis of right leg 02/22/2018  . Posterior tibial tendinitis, right leg   . Right atrial mass   . Chronic diastolic heart failure ([place]) 08/01/2017  . Genetic testing 12/03/2016  . 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 Malignant neoplasm of upper-outer quadrant of left breast in female, estrogen receptor positive ([place]) 11/19/2016  . Chronic idiopathic gout involving toe of left foot without tophus 11/15/2016  . Malignant 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, estrogen receptor positive ([place]) 11/09/2016  . Tendon tear, ankle, left, sequela   .  Traumatic rupture of left anterior tibial tendon 04/20/2016  . Acute kidney injury superimposed on CKD ([place])   . 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 ([place])   . Headache, migraine   . Unexplained weight loss 11/17/2015  . 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 Restless legs syndrome 07/24/2015  . Mild cognitive impairment 01/08/2015  . Abnormality of gait 01/08/2015  . Dizziness 12/19/2014  . Preoperative cardiovascular examination 12/11/2013  . GERD (gastroesophageal reflux disease) 10/06/2013  . Syncope 10/05/2013  . Dementia ([place]) 12/06/2012  . Anxiety 10/28/2012    Class: Acute  . Bilateral lower extremity edema 10/28/2012  . CAD S/P percutaneous coronary angioplasty   . Essential hypertension   . Dyslipidemia, goal LDL below 70    Past Medical History:  Diagnosis Date  . Anemia    have received iron infusions  . Anxiety   . Arthritis     osteoarthritis  . Breast cancer ([place])   . Breast cancer in female [facility])    Bilateral  . CAD S/P percutaneous coronary angioplasty 2007; March 2011   [employer] 3.0 mm 20 mm postdilated 3.6 mm in early mid LAD; status post ISR Cutting Balloon PTCA and March 11 along with PCI of distal mid lesion with a 3.0 mm 12 mm MultiLink vision BMS; the proximal stent causes jailing of SP1 and SP2 with ostial 70-80% lesions  . Cancer (HCC)    Melanoma, Squamous cell Carcinoma  . CHF (congestive heart failure) ([place])   . Chronic kidney disease    stage 2   . 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 Dementia ([place])    mild  . Diabetes mellitus type 2 with neurological manifestations ([place])   . Dyslipidemia, goal LDL below 70   . Full dentures   . Genetic testing 12/03/2016   Germline genetic testing was performed through [employer]'s Common Hereditary Cancers Panel + [employer]'s Melanoma Panel. This custom panel includes analysis of the following 51 genes: APC, ATM,  AXIN2, BAP1, BARD1, BMPR1A, BRCA1, BRCA2, BRIP1, CDH1, CDK4, CDKN2A, CHEK2, CTNNA1, DICER1, EPCAM, GREM1, HOXB13, KIT, MEN1, MITF, MLH1, MSH2, MSH3, MSH6, MUTYH, NBN, NF1, NTHL1, PALB2, PDGFRA, PMS2, POLD1, POL  . GERD (gastroesophageal reflux disease)   . Headache(784.0)   . History of hematuria    Followed by Dr. Gaynelle Arabian  . Hypertension, essential, benign   . Nausea & vomiting 10/2015  . Peripheral neuropathy   . Stroke ([place])   . TIA (transient ischemic attack)    multiple in the past  . Wears glasses     Family History  Problem Relation Age of Onset  . Diabetes Mother   . Hypertension Mother   . Kidney disease Mother   . Hypertension Father   . Emphysema Father   . Heart attack Father   .  Heart disease Father   . Arthritis Sister   . Diabetes Sister   . Hypertension Sister   . Breast cancer Sister 50       treated with mastectomy  . Cervical cancer Sister 26  . Hypertension Brother   . Hyperlipidemia Brother   . Diabetes Brother   . Stroke Brother   . Diabetes Sister   . Hypertension Sister   . Stomach cancer Sister 51  . Hypertension Brother   . ALS Brother        d.62s  . Breast cancer Daughter 83       triple negative treated with lumpectomy, chemo, radiation  . Heart attack Daughter 35       d.45  . COPD Daughter   . Cancer Paternal Aunt        unspecified type  . Cancer Paternal Uncle        unspecified type  . Cancer Paternal Uncle        unspecified type    Past Surgical History:  Procedure Laterality Date  . ABDOMINAL HYSTERECTOMY    . ANKLE FUSION Right 02/22/2018   Procedure: RIGHT SUBTALAR AND TALONAVICULAR FUSION;  Surgeon: Newt Minion, MD;  Location: [facility];  Service: Orthopedics;  Laterality: Right;  . APPENDECTOMY    . BLADDER SUSPENSION    . BREAST LUMPECTOMY Bilateral 2018  . BREAST LUMPECTOMY WITH RADIOACTIVE SEED AND SENTINEL LYMPH NODE BIOPSY Bilateral 12/14/2016   Procedure: BILATERAL BREAST LUMPECTOMY WITH BILATERAL  RADIOACTIVE  SEED AND BILATERAL AXILLARY  SENTINEL LYMPH NODE BIOPSY ERAS PATHWAY;  Surgeon: Alphonsa Overall, MD;  Location: [facility];  Service: General;  Laterality: Bilateral;  PECTORAL BLOCK  . CARDIAC CATHETERIZATION  02/24/2006   85% stenosis in the proximal portion of LAD-arrangements made for PCI on 02/25/2006  . CARDIAC CATHETERIZATION  02/25/2006   80% LAD lesion stented with a 3x8m Liberte stent resulting in reduction of 80% lesion to 0% residual  . CARDIAC CATHETERIZATION  04/26/2006   Medical management  . CARDIAC CATHETERIZATION  06/24/2009   60-70% re-stenosis in the proximal LAD. A 3.25x15 cutting balloon, 3 inflations - 14atm-38sec, 13atm-39sec, and 12atm-40sec reduced to less than 10%. 60% stenosis of the mid/distal LAD stented with a 3x136mMultilink stent.  . Marland KitchenARDIAC CATHETERIZATION  07/09/2009   Medical management  . CAROTID DOPPLER  06/24/2009   40-59% R ICA stenosis. No significant ICA stenosis noted  . CHOLECYSTECTOMY    . DILATION AND CURETTAGE OF UTERUS    . JOINT REPLACEMENT Left   . LESION REMOVAL Right 03/11/2015   Procedure:  EXCISION OF RIGHT PRE TIBIAL LESION;  Surgeon: JaJudeth HornMD;  [facility] Service: General;  Laterality: Right;  . MASS EXCISION Left 06/10/2014   Procedure: EXCISION LEFT LOWER LEG LESION;  Surgeon: JaDoreen SalvageMD;  [facility] Service: General;  Laterality: Left;  . Marland KitchenASS EXCISION Left 09/05/2015   Procedure: EXCISION OF LEFT FOREARM  MASS;  Surgeon: JaJudeth HornMD;  [facility] Service: General;  Laterality: Left;  . NM MYOVIEW LTD  11/16/2011   5 beats a PVC during recovery.  No skin or infarction.  Reached 5 METs, EKG negative for ischemia   . NM MYOVIEW LTD  10/2011   No ischemia or infarction.  Normal EF.  LOW RISK. (Short bursts of 5 beats NSVT during recovery otherwise normal)  . SHOULDER ARTHROSCOPY  10/15  . SHOULDER ARTHROSCOPY  1995   left  . TEE WITHOUT CARDIOVERSION  N/A 08/03/2017   Procedure: TRANSESOPHAGEAL ECHOCARDIOGRAM (TEE);  Surgeon: Skeet Latch, MD;  Location: [facility];  Service: Cardiovascular;; EF 60-65% with no wall motion normalities.  No atrial thrombus noted.  Lightly findings consistent with a lipomatous hypertrophy of the atrial septum.   . TENDON REPAIR Left 05/12/2016   Procedure: Left Anterior Tibial Tendon Reconstruction;  Surgeon: Newt Minion, MD;  Location: [facility];  Service: Orthopedics;  Laterality: Left;  . TOTAL KNEE ARTHROPLASTY  2005   left  . TRANSESOPHAGEAL ECHOCARDIOGRAM  08/03/2016   : EF 60-65% no wall motion normalities.  No atrial thrombus.  Lipomatous hypertrophy of the atrial septum.  No mass noted.  . TRANSTHORACIC ECHOCARDIOGRAM  10/2015   EF 55-60 %. Mild LVH. Mild pulmonary hypertension. Normal valves.  . TRANSTHORACIC ECHOCARDIOGRAM  07/2017   Vigorous LV function.  EF 65-70%.  Mild diastolic dysfunction with no RWMA. GR 1 DD.  Mild aortic sclerosis/no stenosis.  Questionable right atrial mass discounted with TEE)   . WRIST ARTHROPLASTY  2010   cancer lt wrist   Social History   Occupational History  . Occupation: retired    Fish farm manager: RETIRED  Tobacco Use  . Smoking status: Never Smoker  . Smokeless tobacco: Never Used  Substance and Sexual Activity  . Alcohol use: No  . Drug use: No  . Sexual activity: Never

## 2018-03-20 ENCOUNTER — Telehealth (INDEPENDENT_AMBULATORY_CARE_PROVIDER_SITE_OTHER): Payer: Self-pay | Admitting: Orthopedic Surgery

## 2018-03-20 NOTE — Telephone Encounter (Signed)
Patient called left VM she wanted to go home for surgery. No call back number provider on VM I do know patient is currently at [facility] nursing facility.

## 2018-03-20 NOTE — Telephone Encounter (Signed)
I called pt on her mobile number and advised that Dr. Sharol Given does not make the decision to d/c from SNF> if physical therapy feels that she is ok then they will make that decision provided that she has the support and DME that she will need to be safe at home ( which she does) from Dr. Jess Barters standpoint ok to go home as long as therapy feels that she will be safe to do so. Will call with any questions.

## 2018-03-23 ENCOUNTER — Other Ambulatory Visit: Payer: Self-pay | Admitting: *Deleted

## 2018-03-23 DIAGNOSIS — C50411 Malignant neoplasm of upper-outer quadrant of right female breast: Secondary | ICD-10-CM | POA: Diagnosis not present

## 2018-03-23 DIAGNOSIS — F0390 Unspecified dementia without behavioral disturbance: Secondary | ICD-10-CM | POA: Diagnosis not present

## 2018-03-23 DIAGNOSIS — I5030 Unspecified diastolic (congestive) heart failure: Secondary | ICD-10-CM | POA: Diagnosis not present

## 2018-03-23 DIAGNOSIS — Z4789 Encounter for other orthopedic aftercare: Secondary | ICD-10-CM | POA: Diagnosis not present

## 2018-03-23 DIAGNOSIS — E1122 Type 2 diabetes mellitus with diabetic chronic kidney disease: Secondary | ICD-10-CM | POA: Diagnosis not present

## 2018-03-23 DIAGNOSIS — I130 Hypertensive heart and chronic kidney disease with heart failure and stage 1 through stage 4 chronic kidney disease, or unspecified chronic kidney disease: Secondary | ICD-10-CM | POA: Diagnosis not present

## 2018-03-23 NOTE — Patient Outreach (Signed)
[facility]) Care Management  03/24/2018  [name] 07-24-1940 [id number]  Transition of care call  EMMI red alert call: Read DC papers and scheduled follow up patient answers No.   Patient followed by Post acute care coordinator while in rehab at [facility], [place].   Received EMMI red alert message on today,to follow up on discharge to home.   Patient PMHx includes not not limited to : Chronic Diastolic heart failure, Diabetes, CKA, Hypertension, gout, mild cognitive disorder. Right breast CA.  Patient with recent admission to [facility] on 11/27 for right subtalar and talonavicular fusion  Noted readmission 11/29- 12/5 for sepsis, right severe foot pain , hypotension .   Outreach call to patient she discussed being happy to be back at home and feeling much better, patient discussed surgery and hospital admissions.   Patient discussed doing well at home,reports that she is tolerating transferring from wheelchair to chair and commode and shower chair. Patient reports wearing short boot at all times when out to bed.  Patient discussed she does have some swelling in lower legs but it is improved, she is now wearing knee hi compression by Dr. Sharol Given.  Diabetes Patient discussed her blood sugar this am 90 and reading day before 128-130.  She discussed that her blood sugar have rarely been over 200 while in rehab.  Heart Failure  Patient reports her weight being 182.1, denies increase in swelling or shortness of breath . Reviewed worsening symptoms of heart failure to notify MD of .   Social Patient lives at home with her spouse, has supportive daughters . Patient reports spouse and daughter are able to transfer patient to car for appointments . Patient has a wheelchair that she uses in the home to pedal around . Patient discussed that she will have a ramp built by a school in the community and it will be completed by 12/1/. Patient reports she was visited by [facility] home  [facility] for initial visit and will have PT and OT services.   Medications  Patient spouse helps with organizing medications, they deny cost concerns. Patient was recently discharged from hospital and all medications have been reviewed.  Appointments  Patient reports that she plans to transfer to Dr. Sharlett Iles as PCP she discussed it with him while in rehab, she has left an appointment with office  Patient has follow up appointment with Dr.Duda on 1/9  Discussed with patient transition of care follow up after discharge and she is agreeable.   Plan  Will schedule  transition of care home visit in the next week.   [facility] CM Care Plan Problem One     Most Recent Value  Care Plan Problem One  Risk for readmission recent discharge from SNF related to ankle fusion surgery   Role Documenting the Problem One  Care Management [place] for Problem One  Active  [facility] Long Term Goal   Patient will not experience a hospital readmission in the next 31 days   [facility] Long Term Goal Start Date  03/23/18  Interventions for Problem One Long Term Goal  Reviewed discharge instructions with spouse , advised regarding taking medications per discharge instruction. Scheduled home visit .   [facility] CM Short Term Goal #1   Patient will reports attending post discharge visit the 14 days   [facility] CM Short Term Goal #1 Start Date  03/23/18  Interventions for Short Term Goal #1  Discussed importance on follow through with scheduling appointment ,  verified transporation ability .   [facility] CM Short Term Goal #2   Over the next 30 days patient will be able to verbalize improvement in mobility   [facility] CM Short Term Goal #2 Start Date  03/24/18  Interventions for Short Term Goal #2  Advised regarding adherence to limitaitons in weight bearing ,participaiton in ankle exercises as recommended by MD and physical therapy       [name], RN, [place] Management Coordinator  [phone]-  Mobile [phone]- [place]

## 2018-03-24 DIAGNOSIS — E1122 Type 2 diabetes mellitus with diabetic chronic kidney disease: Secondary | ICD-10-CM | POA: Diagnosis not present

## 2018-03-24 DIAGNOSIS — I5030 Unspecified diastolic (congestive) heart failure: Secondary | ICD-10-CM | POA: Diagnosis not present

## 2018-03-24 DIAGNOSIS — Z4789 Encounter for other orthopedic aftercare: Secondary | ICD-10-CM | POA: Diagnosis not present

## 2018-03-24 DIAGNOSIS — C50411 Malignant neoplasm of upper-outer quadrant of right female breast: Secondary | ICD-10-CM | POA: Diagnosis not present

## 2018-03-24 DIAGNOSIS — F0390 Unspecified dementia without behavioral disturbance: Secondary | ICD-10-CM | POA: Diagnosis not present

## 2018-03-24 DIAGNOSIS — I130 Hypertensive heart and chronic kidney disease with heart failure and stage 1 through stage 4 chronic kidney disease, or unspecified chronic kidney disease: Secondary | ICD-10-CM | POA: Diagnosis not present

## 2018-03-27 ENCOUNTER — Other Ambulatory Visit: Payer: Self-pay | Admitting: *Deleted

## 2018-03-27 DIAGNOSIS — F0390 Unspecified dementia without behavioral disturbance: Secondary | ICD-10-CM | POA: Diagnosis not present

## 2018-03-27 DIAGNOSIS — I5030 Unspecified diastolic (congestive) heart failure: Secondary | ICD-10-CM | POA: Diagnosis not present

## 2018-03-27 DIAGNOSIS — I130 Hypertensive heart and chronic kidney disease with heart failure and stage 1 through stage 4 chronic kidney disease, or unspecified chronic kidney disease: Secondary | ICD-10-CM | POA: Diagnosis not present

## 2018-03-27 DIAGNOSIS — C50411 Malignant neoplasm of upper-outer quadrant of right female breast: Secondary | ICD-10-CM | POA: Diagnosis not present

## 2018-03-27 DIAGNOSIS — E1122 Type 2 diabetes mellitus with diabetic chronic kidney disease: Secondary | ICD-10-CM | POA: Diagnosis not present

## 2018-03-27 DIAGNOSIS — Z4789 Encounter for other orthopedic aftercare: Secondary | ICD-10-CM | POA: Diagnosis not present

## 2018-03-27 NOTE — Patient Outreach (Signed)
[facility]) Care Management  03/27/2018  [name] 14-Oct-1940 [id number]   CSW spoke with pt today by phone who reports she was released from [facility] SNF on 12/24.  "A great Christmas gift to go home". She reports HH, DME and RX have all been arranged and received. She has good support at home from her husband and extended family. "I am use to doing so much on my own that this is the hardest thing I have ever dealt with ".  She is motivated and hopeful to continue to recover and get back to doing the activities she has enjoyed (includoing mowing, cooking, cleaning) as previously was doing.  Pt is awaiting a call back from Dr Shon Baton office for hopeful new PCP appointment. She plans to call again 03/30/2018 if no return call is received by then.  CSW spoke with [facility] RNCM, Landis Martins, and will follow up next week for further assessment of CSW needs.  None identified at this time.   Eduard Clos, MSW, [place] Worker  [place] [phone]

## 2018-03-31 ENCOUNTER — Other Ambulatory Visit: Payer: Self-pay | Admitting: *Deleted

## 2018-03-31 ENCOUNTER — Encounter: Payer: Self-pay | Admitting: *Deleted

## 2018-03-31 DIAGNOSIS — F0390 Unspecified dementia without behavioral disturbance: Secondary | ICD-10-CM | POA: Diagnosis not present

## 2018-03-31 DIAGNOSIS — I130 Hypertensive heart and chronic kidney disease with heart failure and stage 1 through stage 4 chronic kidney disease, or unspecified chronic kidney disease: Secondary | ICD-10-CM | POA: Diagnosis not present

## 2018-03-31 DIAGNOSIS — Z4789 Encounter for other orthopedic aftercare: Secondary | ICD-10-CM | POA: Diagnosis not present

## 2018-03-31 DIAGNOSIS — I5030 Unspecified diastolic (congestive) heart failure: Secondary | ICD-10-CM | POA: Diagnosis not present

## 2018-03-31 DIAGNOSIS — E1122 Type 2 diabetes mellitus with diabetic chronic kidney disease: Secondary | ICD-10-CM | POA: Diagnosis not present

## 2018-03-31 DIAGNOSIS — C50411 Malignant neoplasm of upper-outer quadrant of right female breast: Secondary | ICD-10-CM | POA: Diagnosis not present

## 2018-03-31 NOTE — Patient Outreach (Signed)
[facility]) Care Management   03/31/2018  SAHARA FUJIMOTO 02/02/1941 [id number]  KARITA DRALLE is an 78 y.o. female  Subjective:  Patient reports feeling tired after therapy session on this morning .  Patient discussed her last visit with therapy will be on Monday.   Objective:  BP 124/80 (BP Location: Right Arm, Patient Position: Sitting, Cuff Size: Normal)   Pulse 80   Resp 18   Ht 1.676 m (5\' 6" )   Wt 182 lb (82.6 kg) Comment: patient report  SpO2 98%   BMI 29.38 kg/m  Review of Systems  Constitutional: Negative.   HENT: Negative.   Eyes: Negative.   Respiratory: Negative.   Cardiovascular: Positive for leg swelling.       Right leg  Gastrointestinal: Negative.   Genitourinary: Negative.   Musculoskeletal: Positive for joint pain.  Skin: Negative.   Neurological: Negative.   Endo/Heme/Allergies: Negative.   Psychiatric/Behavioral: Negative.     Physical Exam  Constitutional: She is oriented to person, place, and time. She appears well-developed and well-nourished.  Cardiovascular: Normal rate and normal heart sounds.  Respiratory: Effort normal and breath sounds normal.  GI: Soft.  Neurological: She is alert and oriented to person, place, and time.  Skin: Skin is warm and dry.  Psychiatric: She has a normal mood and affect. Her behavior is normal. Judgment and thought content normal.    Encounter Medications:   Outpatient Encounter Medications as of 03/31/2018  Medication Sig Note  . allopurinol (ZYLOPRIM) 100 MG tablet Take 1 tablet (100 mg total) by mouth 2 (two) times daily.   . bumetanide (BUMEX) 2 MG tablet Take 1 tablet (2 mg total) by mouth 2 (two) times daily.   . clopidogrel (PLAVIX) 75 MG tablet TAKE 1 TABLET DAILY (Patient taking differently: Take 75 mg by mouth daily. )   . COD LIVER OIL PO Take 415 mg by mouth daily.   . colchicine 0.6 MG tablet Take 1 tablet (0.6 mg total) by mouth daily. PRN gout flare (Patient taking differently: Take  0.6 mg by mouth daily as needed (for foot pain/gout flare up x 3 days then discontinue). ) 03/23/2018: Has on hand  . Cranberry 500 MG TABS Take 500 mg by mouth every evening.   . diazepam (VALIUM) 5 MG tablet Take 1 tablet (5 mg total) by mouth 2 (two) times daily as needed for anxiety.   Marland Kitchen exemestane (AROMASIN) 25 MG tablet TAKE 1 TABLET DAILY AFTER BREAKFAST (Patient taking differently: Take 25 mg by mouth daily. )   . gabapentin (NEURONTIN) 300 MG capsule Take 1 capsule (300 mg total) by mouth 2 (two) times daily. Take 1 during the day if needed and 2 at night (Patient taking differently: Take 300 mg by mouth 2 (two) times daily. )   . gabapentin (NEURONTIN) 300 MG capsule Take 300-600 mg by mouth daily as needed (nerve pain).   . insulin NPH Human (HUMULIN N,NOVOLIN N) 100 UNIT/ML injection Inject 20-25 Units into the skin See admin instructions. Inject 20 units in the morning & inject 25 units after supper. 02/24/2018: Took 20 units 02/24/18  . Krill Oil 500 MG CAPS Take 500 mg by mouth daily.   . Methylfol-Methylcob-Acetylcyst (CEREFOLIN NAC) 6-2-600 MG TABS TAKE 1 CAPLET BY MOUTH ONCE DAILY. (Patient taking differently: Take 1 tablet by mouth daily. )   . metolazone (ZAROXOLYN) 2.5 MG tablet Take 2.5 mg by mouth daily. Taking on Monday and Thursday 30 minutes before bumex.   Marland Kitchen  metoprolol succinate (TOPROL-XL) 25 MG 24 hr tablet Take 0.5 tablets (12.5 mg total) by mouth every evening. 03/31/2018: Taking 25 mg daily, 1/2 of 50 mg tablet   . mirtazapine (REMERON) 15 MG tablet Take 15 mg by mouth at bedtime. Taking 1 1/2 tablet at bedtime   . nitroGLYCERIN (NITROSTAT) 0.4 MG SL tablet Place 0.4 mg under the tongue every 5 (five) minutes as needed for chest pain. 03/23/2018: Has on hand  . oxycodone-acetaminophen (PERCOCET) 2.5-325 MG tablet Take 1 tablet by mouth every 4 (four) hours as needed for pain.   . pantoprazole (PROTONIX) 40 MG tablet TAKE 1 TABLET DAILY (CONTACT OFFICE FOR ADDITIONAL  REFILLS, FINAL ATTEMPT) (Patient taking differently: Take 40 mg by mouth daily before breakfast. )   . potassium chloride SA (K-DUR,KLOR-CON) 20 MEQ tablet Take 40 mEq by mouth 2 (two) times daily.    . pramipexole (MIRAPEX) 0.25 MG tablet TAKE 1 TABLET AT 4:00 P.M. AND 2 TABLETS (0.5 MG) AT BEDTIME (Patient taking differently: Take 0.25-0.5 mg by mouth See admin instructions. TAKE 1 TABLET AT 4:00 P.M. AND 2 TABLETS (0.5 MG) AT BEDTIME)   . rosuvastatin (CRESTOR) 20 MG tablet Take 20 mg by mouth daily with supper.    Marland Kitchen Ubiquinol (QUNOL COQ10/UBIQUINOL/MEGA) 100 MG CAPS Take 100 mg by mouth every evening. CoQ10, Qunol    . Calcium Carb-Cholecalciferol (CALCIUM 600+D3) 600-800 MG-UNIT TABS Take 1 tablet by mouth daily.   . midodrine (PROAMATINE) 5 MG tablet Take 1 tablet (5 mg total) by mouth 3 (three) times daily with meals. (Patient not taking: Reported on 03/23/2018)   . sennosides-docusate sodium (SENOKOT-S) 8.6-50 MG tablet Take 1 tablet by mouth daily as needed for constipation.    No facility-administered encounter medications on file as of 03/31/2018.     Functional Status:   In your present state of health, do you have any difficulty performing the following activities: 03/31/2018 02/24/2018  Hearing? N Y  Vision? N N  Difficulty concentrating or making decisions? N N  Walking or climbing stairs? Y Y  Comment no weight bearing right leg -  Dressing or bathing? [name]  Comment daughter assist with shower -  Doing errands, shopping? Y Y  Comment husband assist  -  Conservation officer, nature and eating ? Y -  Comment husband prepares meals -  Using the Toilet? N -  In the past six months, have you accidently leaked urine? N -  Do you have problems with loss of bowel control? N -  Managing your Medications? Y -  Comment husband assist  -  Managing your Finances? Y -  Comment husband assist  -  Housekeeping or managing your Housekeeping? Y -  Comment husband assist  -  Some recent data might be  hidden    Fall/Depression Screening:    Fall Risk  03/31/2018 09/01/2017 08/23/2017  Falls in the past year? 1 Yes Yes  Number falls in past yr: 0 2 or more 1  Injury with Fall? 1 No No  Risk for fall due to : History of fall(s);Impaired balance/gait;Impaired mobility Impaired balance/gait Impaired balance/gait;History of fall(s)  Follow up Falls evaluation completed;Falls prevention discussed - Falls evaluation completed   PHQ 2/9 Scores 03/31/2018 08/23/2017 03/21/2017  PHQ - 2 Score 0 1 1    Assessment:  Routine home visit   Right ankle fusion surgery Progressing with home therapy with limits in weight bearing, wearing compression hose and has short boot to right foot.  Patient completing  exercises as recommended.  Tolerating personal  at home with assist, pedals around home in wheelchair helping with some chores as able. . Patient drinks 1 glucerna a day.  Patient has all equipment needs wheelchair, bedside commode, walker.    Heart failure  Unable to bear weight to stand for daily weights yet due restrictions since surgery.  Review of worsening heart failure symptoms . Denies shortness of breath, no increase in swelling, continues to take medications as prescribed.  Patient limiting salt in diet to 2 grams per day and fluid to less than 2 lites.   Diabetes  Checking blood sugars twice daily, readings 89-135, with occasional 200 in the evening.  No low blood sugar episodes, patient and wife able to verbalize treating low blood sugar protocol.  Patient has completed eye exam in June 2019 at Dr. Sabra Heck and up to date on flu shot.   Appointments Patient to  arrange follow up with PCP for post hospital visit Patient has follow up with Dr.Duda 1/9  Plan: Will send PCP visit note Will continue weekly transition of care outreaches.    [facility] CM Care Plan Problem One     Most Recent Value  Care Plan Problem One  Risk for readmission recent discharge from SNF related to ankle fusion  surgery   Role Documenting the Problem One  Care Management [place] for Problem One  Active  [facility] Long Term Goal   Patient will not experience a hospital readmission in the next 60 days   [facility] Long Term Goal Start Date  03/23/18  Interventions for Problem One Long Term Goal  Home visit completed,   [facility] CM Short Term Goal #1   Patient will report attending all medical appointments over the next 30 days   [facility] CM Short Term Goal #1 Start Date  03/23/18  Interventions for Short Term Goal #1  Encouraged regarding scheduling post discharge visit with current PCP until able to establish with Dr.Paterson, discussed next visit with Dr. Sharol Given , family to provide transportation.   [facility] CM Short Term Goal #2   Over the next 30 days patient will be able to verbalize improvement in mobility   [facility] CM Short Term Goal #2 Start Date  03/24/18  Interventions for Short Term Goal #2  Reviewed current limits in weight bearing, encouraged continuing with exercise plan provided by PT   [facility] CM Short Term Goal #3  Over the next 30 days patient will report decrease swelling in right leg over the next 30 days   [facility] CM Short Term Goal #3 Start Date  03/31/18  Interventions for Short Tern Goal #3  Discussed with patient , elevating legs  while sitting in chair on and off during the day,       [name], RN, [place] Management Coordinator  [phone]- Mobile [phone]- [place]

## 2018-04-03 DIAGNOSIS — E1122 Type 2 diabetes mellitus with diabetic chronic kidney disease: Secondary | ICD-10-CM | POA: Diagnosis not present

## 2018-04-03 DIAGNOSIS — F0390 Unspecified dementia without behavioral disturbance: Secondary | ICD-10-CM | POA: Diagnosis not present

## 2018-04-03 DIAGNOSIS — I5030 Unspecified diastolic (congestive) heart failure: Secondary | ICD-10-CM | POA: Diagnosis not present

## 2018-04-03 DIAGNOSIS — C50411 Malignant neoplasm of upper-outer quadrant of right female breast: Secondary | ICD-10-CM | POA: Diagnosis not present

## 2018-04-03 DIAGNOSIS — Z4789 Encounter for other orthopedic aftercare: Secondary | ICD-10-CM | POA: Diagnosis not present

## 2018-04-03 DIAGNOSIS — I130 Hypertensive heart and chronic kidney disease with heart failure and stage 1 through stage 4 chronic kidney disease, or unspecified chronic kidney disease: Secondary | ICD-10-CM | POA: Diagnosis not present

## 2018-04-04 NOTE — Progress Notes (Signed)
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  Update 12/06/2012 (PS): [name] returns for f/u after last vist 06/07/2012. She has stopped cerefolin nac as insurance is not paying for it. Memory loss seems stable but she feels subjectively she was doing better on Cerefolin nac. She recently fell of a tractor in her yard and hurt her foot but is improving. She continues to have poor recent memory but is able to recall later. She decided not to do expedition 3 study upon advise from [name] her cardiologist.   Update 01/10/2014 ; she returns for followup after last visit 6 months ago who. She is scheduled in about the same. She continues to have mild short-term memory difficulties unchanged. She has been taking several daily and finds it quite useful. She saw primary physician Dr. Gerarda Fraction who suggested she stop the Depakote. 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 She used to also have some light sensitivity and possibly migrainous phenomena which also seemed to be controlled on Depakote. She underwent right shoulder surgery 2 weeks ago which went well and she has no complaints. She was hospitalized briefly twice for syncopal episodes and she thought to be vasovagal related to a diarrhea episode and dehydration. Update 07/12/2014 : She returns for follow-up after last visit 6 months ago. He is accompanied by her husband . She states some memory difficulties unchanged. She reads a lot particularly on religion. She has a difficult time remembering the day and the date but has not used good compensatory strategies for this. She remains on Cerefolin which she  likes. She has noted increasing symptoms of restless legs and at times has to take 2 tablets on Mirapex which work well. She remains on Depakote as she feels it helps her headaches and vision difficulties. She states her blood pressure is normally quite high but she has some whitecoat hypertension blood pressure is elevated today at 140/100. UPDATE 04/27/2017[name], 78 year old female returns for six-month follow-up. 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 She reports that she has good and bad days when she is not feeling her best she stays at home and keeps to herself. She continues to cook and drive and perform all activities of daily living. She reports that she has not gotten lost in familiar places. Her most recent hemoglobin A1c was 5.2 No further syncopal episodes since stopping Depakote . She remains on Cerefolin which she feels is beneficial for her neuropathy and her memory. She is also on Mirapex for restless legs.  She returns for reevaluation  UPDATE 01/22/16 [name], 78 year old female returns for follow-up she has a history of mild cognitive impairment and memory score is stable today at 30/30. She remains on Cerefolin. She continues to cook and perform activities of daily living. She claims her blood sugars are in good control.She has restless leg syndrome which has gotten mildly worse in the left leg worse since she now has a stress fracture to her left foot and is in a boot. She had a fall at home. She has not had further syncopal episodes since stopping Depakote last year. She returns for reevaluation Update 11/23/2016 :  She returns for follow-up after last visit 10 months ago. She states her memory difficulties remain about unchanged however she's had an episode of shingles in February this year followed by severe postherpetic neuralgic pain which still persists. She has daily almost constant pain and she has sensitivity to touch beneath her right shoulder  and upper back. She has been taking gabapentin at night and during the day she takes oxycodone which does take the edge off but makes her sleepy and drowsy. She has not tried other medication like Topamax and Lyrica. She continues to take senna fallen and does participate in cognitively challenging activities and feels some memory difficulties are unchanged. On the Mini-Mental status exam today she scored 26/30 with deficits in orientation and attention. She was able to name 11 animals with 4 legs. Clock drawing was 4/4. Update 03/03/2017 : She returns for follow-up after last visit 3 months ago. She still has significant postherpetic neuralgic pain. She feels Topamax seems to have helped but she is only on 25 mg twice daily which is tolerating well without side effects. She continues to take gabapentin 300 and the morning and 6 and at night as well. She did tried Lyrica in the past which was helping but she developed significant leg swelling and had to discontinue it. She has subsequently had bilateral mastectomy for breast cancer on 9/18 518 and has finished 2 weeks out of a 4 week course of radiation treatment. She states her blood pressure is well controlled and today it is 141/91. She's been watching her diet and her sugars are well controlled on last hemoglobin A1c was 5.4. She states her mild memory difficulties appeared to be unchanged. She does participate in regular activities for cognitively challenging tasks like solving crossword puzzles sudoku. She also takes CerefolinNAC UPDATE 6/6/2019[name], 78 year old female returns for follow-up with continued significant postherpetic neuralgia pain.  Her Topamax was increased at her last visit but it was not been beneficial so she stopped the medication.  She remains on gabapentin 600 mg at night.  She is failed Lyrica in the past which caused significant leg swelling.  She had an admission 2 weeks for congestive  heart failure.  She is due to follow-up  with her cardiologist today.  Blood pressure in the office today well controlled at 122/80.  CBG this morning 96, most recent hemoglobin A1c 5.4.  Her memory difficulties are unchanged and her MMSE is stable.  She continues to participate in activities for cognitively challenging task. ADLs 6/6. IADLs 7/8.  She returns for reevaluation UPDATE 1/9/2020[name], 78 year old female returns for follow-up with postherpetic neuralgia pain controlled with gabapentin 3 tablets daily.  She also has history of mild cognitive impairment and new complaint today of daytime drowsiness.  She had recent surgery on her ankle.  In November by Dr.Duda. Right subtalar and Talonavicular fusion.  She continues to be nonweightbearing and has a boot on the right.  She has had ER admission for hypokalemia and precordial chest pain since last seen.  She has a history of congestive heart failure.  Blood pressure in the office today 131/80.  She has recently had some changes to her insulin with better control.  Most recent hemoglobin A1c 7.1 on 02/22/2018.  Her mild cognitive impairment is stable.  She has 10 grade education.  She takes Cerefolin.  ADLs 6, IADLs 5 out of 8.  ESS score 12.  She returns for reevaluation   REVIEW OF SYSTEMS: Full 14  system review of systems performed and notable only for those listed, all others are neg:  Constitutional: Fatigue Cardiovascular: neg Ear/Nose/Throat: neg  Skin: neg Eyes: neg Respiratory: neg Gastroitestinal: neg  Hematology/Lymphatic: Bruising easy Endocrine: neg Musculoskeletal: None weightbearing due to right foot surgery Allergy/Immunology: neg Neurological: Mild cognitive impairment,  Psychiatric: Depression anxiety Sleep : Restless leg, daytime sleepiness   ALLERGIES: Allergies  Allergen Reactions  . Ultram [Tramadol] Other (See Comments)    Pt has seizure activity with this medication  . Lipitor [Atorvastatin] Other (See Comments)    Bone and muscle pain  .  Lyrica [Pregabalin] Other (See Comments)    Weight gain, extremity swelling  . Reglan [Metoclopramide] Other (See Comments)    Tardive dyskinesia; paradoxical reaction not relieved by benadryl  . Nsaids Swelling    SWELLING REACTION UNSPECIFIED     HOME MEDICATIONS: Outpatient Medications Prior to Visit  Medication Sig Dispense Refill  . allopurinol (ZYLOPRIM) 100 MG tablet Take 1 tablet (100 mg total) by mouth 2 (two) times daily. 60 tablet 3  . bumetanide (BUMEX) 2 MG tablet Take 1 tablet (2 mg total) by mouth 2 (two) times daily. 60 tablet 0  . Calcium Carb-Cholecalciferol (CALCIUM 600+D3) 600-800 MG-UNIT TABS Take 1 tablet by mouth daily.    . clopidogrel (PLAVIX) 75 MG tablet TAKE 1 TABLET DAILY (Patient taking differently: Take 75 mg by mouth daily. ) 90 tablet 4  . COD LIVER OIL PO Take 415 mg by mouth daily.    . colchicine 0.6 MG tablet Take 1 tablet (0.6 mg total) by mouth daily. PRN gout flare (Patient taking differently: Take 0.6 mg by mouth daily as needed (for foot pain/gout flare up x 3 days then discontinue). ) 90 tablet 3  . Cranberry 500 MG TABS Take 500 mg by mouth every evening.    . diazepam (VALIUM) 5 MG tablet Take 1 tablet (5 mg total) by mouth 2 (two) times daily as needed for anxiety. 10 tablet 0  . exemestane (AROMASIN) 25 MG tablet TAKE 1 TABLET DAILY AFTER BREAKFAST (Patient taking differently: Take 25 mg by mouth daily. ) 90 tablet 4  . gabapentin (NEURONTIN) 300 MG capsule Take 1 capsule (300 mg total) by mouth 2 (two) times daily. Take 1 during the day if needed and 2 at night (Patient taking differently: Take 300 mg by mouth 2 (two) times daily. ) 270 capsule 1  . gabapentin (NEURONTIN) 300 MG capsule Take 300-600 mg by mouth daily as needed (nerve pain).    . insulin NPH Human (HUMULIN N,NOVOLIN N) 100 UNIT/ML injection Inject 20-25 Units into the skin See admin instructions. Inject 20 units in the morning & inject 25 units after supper.    Javier Docker Oil 500 MG  CAPS Take 500 mg by mouth daily.    . Methylfol-Methylcob-Acetylcyst (CEREFOLIN NAC) 6-2-600 MG TABS TAKE 1 CAPLET BY MOUTH ONCE DAILY. (Patient taking differently: Take 1 tablet by mouth daily. ) 90 each 3  . metolazone (ZAROXOLYN) 2.5 MG tablet Take 2.5 mg by mouth daily. Taking on Monday and Thursday 30 minutes before bumex.    . metoprolol succinate (TOPROL-XL) 25 MG 24 hr tablet Take 0.5 tablets (12.5 mg total) by mouth every evening. 30 tablet 0  . mirtazapine (REMERON) 15 MG tablet Take 15 mg by mouth at bedtime. Taking 1 1/2 tablet at bedtime    . nitroGLYCERIN (NITROSTAT) 0.4 MG SL tablet Place 0.4 mg under the tongue every 5 (five) minutes as needed for  chest pain.    Marland Kitchen oxycodone-acetaminophen (PERCOCET) 2.5-325 MG tablet Take 1 tablet by mouth every 4 (four) hours as needed for pain.    . pantoprazole (PROTONIX) 40 MG tablet TAKE 1 TABLET DAILY (CONTACT OFFICE FOR ADDITIONAL REFILLS, FINAL ATTEMPT) (Patient taking differently: Take 40 mg by mouth daily before breakfast. ) 90 tablet 0  . potassium chloride SA (K-DUR,KLOR-CON) 20 MEQ tablet Take 40 mEq by mouth 2 (two) times daily.     . pramipexole (MIRAPEX) 0.25 MG tablet TAKE 1 TABLET AT 4:00 P.M. AND 2 TABLETS (0.5 MG) AT BEDTIME (Patient taking differently: Take 0.25-0.5 mg by mouth See admin instructions. TAKE 1 TABLET AT 4:00 P.M. AND 2 TABLETS (0.5 MG) AT BEDTIME) 270 tablet 2  . rosuvastatin (CRESTOR) 20 MG tablet Take 20 mg by mouth daily with supper.     Marland Kitchen Ubiquinol (QUNOL COQ10/UBIQUINOL/MEGA) 100 MG CAPS Take 100 mg by mouth every evening. CoQ10, Qunol     . midodrine (PROAMATINE) 5 MG tablet Take 1 tablet (5 mg total) by mouth 3 (three) times daily with meals. (Patient not taking: Reported on 03/23/2018) 90 tablet 0  . sennosides-docusate sodium (SENOKOT-S) 8.6-50 MG tablet Take 1 tablet by mouth daily as needed for constipation.     No facility-administered medications prior to visit.     PAST MEDICAL HISTORY: Past Medical  History:  Diagnosis Date  . Anemia    have received iron infusions  . Anxiety   . Arthritis     osteoarthritis  . Breast cancer ([place])   . Breast cancer in female [facility])    Bilateral  . CAD S/P percutaneous coronary angioplasty 2007; March 2011   [facility] 3.0 mm 20 mm postdilated 3.6 mm in early mid LAD; status post ISR Cutting Balloon PTCA and March 11 along with PCI of distal mid lesion with a 3.0 mm 12 mm MultiLink vision BMS; the proximal stent causes jailing of SP1 and SP2 with ostial 70-80% lesions  . Cancer (HCC)    Melanoma, Squamous cell Carcinoma  . CHF (congestive heart failure) ([place])   . Chronic kidney disease    stage 2   . 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 Dementia ([place])    mild  . Diabetes mellitus type 2 with neurological manifestations ([place])   . Dyslipidemia, goal LDL below 70   . Full dentures   . Genetic testing 12/03/2016   Germline genetic testing was performed through [facility] Common Hereditary Cancers Panel + [facility] Melanoma Panel. This custom panel includes analysis of the following 51 genes: APC, ATM, AXIN2, BAP1, BARD1, BMPR1A, BRCA1, BRCA2, BRIP1, CDH1, CDK4, CDKN2A, CHEK2, CTNNA1, DICER1, EPCAM, GREM1, HOXB13, KIT, MEN1, MITF, MLH1, MSH2, MSH3, MSH6, MUTYH, NBN, NF1, NTHL1, PALB2, PDGFRA, PMS2, POLD1, POL  . GERD (gastroesophageal reflux disease)   . Headache(784.0)   . History of hematuria    Followed by Dr. Gaynelle Arabian  . Hypertension, essential, benign   . Nausea & vomiting 10/2015  . Peripheral neuropathy   . Stroke ([place])   . TIA (transient ischemic attack)    multiple in the past  . Wears glasses     PAST SURGICAL HISTORY: Past Surgical History:  Procedure Laterality Date  . ABDOMINAL HYSTERECTOMY    . ANKLE FUSION Right 02/22/2018   Procedure: RIGHT SUBTALAR AND TALONAVICULAR FUSION;  Surgeon: Newt Minion, MD;  Location: [place];  Service: Orthopedics;  Laterality: Right;  . APPENDECTOMY    .  BLADDER SUSPENSION    . BREAST LUMPECTOMY Bilateral 2018  . BREAST LUMPECTOMY WITH RADIOACTIVE SEED AND SENTINEL LYMPH NODE BIOPSY Bilateral 12/14/2016   Procedure: BILATERAL BREAST LUMPECTOMY WITH BILATERAL  RADIOACTIVE SEED AND BILATERAL AXILLARY  SENTINEL LYMPH NODE BIOPSY ERAS PATHWAY;  Surgeon: Alphonsa Overall, MD;  Location: [place];  Service: General;  Laterality: Bilateral;  PECTORAL BLOCK  . CARDIAC CATHETERIZATION  02/24/2006   85% stenosis in the proximal portion of LAD-arrangements made for PCI on 02/25/2006  . CARDIAC CATHETERIZATION  02/25/2006   80% LAD lesion stented with a 3x81m Liberte stent resulting in reduction of 80% lesion to 0% residual  . CARDIAC CATHETERIZATION  04/26/2006   Medical management  . CARDIAC CATHETERIZATION  06/24/2009   60-70% re-stenosis in the proximal LAD. A 3.25x15 cutting balloon, 3 inflations - 14atm-38sec, 13atm-39sec, and 12atm-40sec reduced to less than 10%. 60% stenosis of the mid/distal LAD stented with a 3x133mMultilink stent.  . Marland KitchenARDIAC CATHETERIZATION  07/09/2009   Medical management  . CAROTID DOPPLER  06/24/2009   40-59% R ICA stenosis. No significant ICA stenosis noted  . CHOLECYSTECTOMY    . DILATION AND CURETTAGE OF UTERUS    . JOINT REPLACEMENT Left   . LESION REMOVAL Right 03/11/2015   Procedure:  EXCISION OF RIGHT PRE TIBIAL LESION;  Surgeon: JaJudeth HornMD;  [facility] Service: General;  Laterality: Right;  . MASS EXCISION Left 06/10/2014   Procedure: EXCISION LEFT LOWER LEG LESION;  Surgeon: JaDoreen SalvageMD;  [place] [facility] Service: General;  Laterality: Left;  . Marland KitchenASS EXCISION Left 09/05/2015   Procedure: EXCISION OF LEFT FOREARM  MASS;  Surgeon: JaJudeth HornMD;  [place] Service: General;  Laterality: Left;  . NM MYOVIEW LTD  11/16/2011   5 beats a PVC during recovery.  No skin or infarction.  Reached 5 METs, EKG negative for ischemia   . NM MYOVIEW LTD   10/2011   No ischemia or infarction.  Normal EF.  LOW RISK. (Short bursts of 5 beats NSVT during recovery otherwise normal)  . SHOULDER ARTHROSCOPY  10/15  . SHOULDER ARTHROSCOPY  1995   left  . TEE WITHOUT CARDIOVERSION N/A 08/03/2017   Procedure: TRANSESOPHAGEAL ECHOCARDIOGRAM (TEE);  Surgeon: RaSkeet LatchMD;  [facility] [facility] Service: Cardiovascular;; EF 60-65% with no wall motion normalities.  No atrial thrombus noted.  Lightly findings consistent with a lipomatous hypertrophy of the atrial septum.   . TENDON REPAIR Left 05/12/2016   Procedure: Left Anterior Tibial Tendon Reconstruction;  Surgeon: MaNewt MinionMD;  [place] [facility] Service: Orthopedics;  Laterality: Left;  . TOTAL KNEE ARTHROPLASTY  2005   left  . TRANSESOPHAGEAL ECHOCARDIOGRAM  08/03/2016   : EF 60-65% no wall motion normalities.  No atrial thrombus.  Lipomatous hypertrophy of the atrial septum.  No mass noted.  . TRANSTHORACIC ECHOCARDIOGRAM  10/2015   EF 55-60 %. Mild LVH. Mild pulmonary hypertension. Normal valves.  . TRANSTHORACIC ECHOCARDIOGRAM  07/2017   Vigorous LV function.  EF 65-70%.  Mild diastolic dysfunction with no RWMA. GR 1 DD.  Mild aortic sclerosis/no stenosis.  Questionable right atrial mass discounted with TEE)   . WRIST ARTHROPLASTY  2010   cancer lt wrist    FAMILY HISTORY: Family History  Problem Relation Age of Onset  . Diabetes Mother   . Hypertension Mother   . Kidney disease Mother   . Hypertension Father   .  Emphysema Father   . Heart attack Father   . Heart disease Father   . Arthritis Sister   . Diabetes Sister   . Hypertension Sister   . Breast cancer Sister 57       treated with mastectomy  . Cervical cancer Sister 59  . Hypertension Brother   . Hyperlipidemia Brother   . Diabetes Brother   . Stroke Brother   . Diabetes Sister   . Hypertension Sister   . Stomach cancer Sister 24  . Hypertension Brother   . ALS Brother        d.62s  . Breast cancer  Daughter 13       triple negative treated with lumpectomy, chemo, radiation  . Heart attack Daughter 61       d.45  . COPD Daughter   . Cancer Paternal Aunt        unspecified type  . Cancer Paternal Uncle        unspecified type  . Cancer Paternal Uncle        unspecified type    SOCIAL HISTORY: Social History   Socioeconomic History  . Marital status: Married    Spouse name: [name]  . Number of children: 3  . Years of education: 54  . Highest education level: Not on file  Occupational History  . Occupation: retired    Fish farm manager: RETIRED  Social Needs  . Financial resource strain: Not on file  . Food insecurity:    Worry: Not on file    Inability: Not on file  . Transportation needs:    Medical: Not on file    Non-medical: Not on file  Tobacco Use  . Smoking status: Never Smoker  . Smokeless tobacco: Never Used  Substance and Sexual Activity  . Alcohol use: No  . Drug use: No  . Sexual activity: Never  Lifestyle  . Physical activity:    Days per week: Not on file    Minutes per session: Not on file  . Stress: Not on file  Relationships  . Social connections:    Talks on phone: Not on file    Gets together: Not on file    Attends religious service: Not on file    Active member of club or organization: Not on file    Attends meetings of clubs or organizations: Not on file    Relationship status: Not on file  . Intimate partner violence:    Fear of current or ex partner: Not on file    Emotionally abused: Not on file    Physically abused: Not on file    Forced sexual activity: Not on file  Other Topics Concern  . Not on file  Social History Narrative   She is a married mother of 75, grandmother of 2.  Usually very active and out about the house.  But currently over last 6-8 weeks has been incapacitated.  She never smoked and does not drink alcohol.   10 th grade     PHYSICAL EXAM  Vitals:   04/06/18 0750  BP: 131/80  Pulse: 78   There is no height  or weight on file to calculate BMI.  Generalized: Well developed, obese female in no acute distress  Head: normocephalic and atraumatic,. Oropharynx benign  Neck: Supple,  Musculoskeletal: No deformity, Skin pedal edema   Neurological examination   Mentation: Alert AFT 12. Clock drawing 4/4. Follows all commands speech and language fluent.  MMSE - Mini Mental State Exam  04/06/2018 09/01/2017 11/23/2016  Orientation to time _0 Orientation to Place _1 Registration _2 Attention/ Calculation _3 Recall _4 Language- name 2 objects _5 Language- repeat _6 Language- follow 3 step command _7 Language- read & follow direction _8 Write a sentence _9 Copy design _10 Total score _11 Cranial nerve II-XII: Pupils were equal round reactive to light extraocular movements were full, visual field were full on confrontational test. Facial sensation and strength were normal. hearing was intact to finger rubbing bilaterally. Uvula tongue midline. head turning and shoulder shrug were normal and symmetric.Tongue protrusion into cheek strength was normal. Motor: normal bulk and tone, full strength in the BUE, BLE, except right LE. Boot in place non weight bearing. mild head tremor noted Sensory: Decreased pinprick to mid shin decreased vibratory to toes   Coordination: finger-nose-finger,  no dysmetria Reflexes: 1+ upper lower and symmetric plantar responses were flexor bilaterally. Gait and Station: Not ambulated in wheelchair DIAGNOSTIC DATA (LABS, IMAGING, TESTING) - I reviewed patient records, labs, notes, testing and imaging myself where available.  Lab Results  Component Value Date   WBC 9.1 03/04/2018   HGB 11.3 (L) 03/04/2018   HCT 36.8 03/04/2018   MCV 97.6 03/04/2018   PLT 400 03/04/2018      Component Value Date/Time   NA 139 03/04/2018 1426   NA 140 08/01/2017 1143   K 4.2 03/04/2018 1426   CL 100 03/04/2018 1426   CO2 26 03/04/2018 1426     GLUCOSE 106 (H) 03/04/2018 1426   BUN 20 03/04/2018 1426   BUN 25 08/01/2017 1143   CREATININE 1.70 (H) 03/04/2018 1426   CREATININE 2.45 (H) 02/13/2018 1114   CREATININE 0.94 01/09/2013 0839   CALCIUM 9.6 03/04/2018 1426   PROT 6.0 (L) 02/25/2018 0725   PROT 7.1 08/01/2017 1143   ALBUMIN 2.3 (L) 03/01/2018 0213   ALBUMIN 4.7 08/01/2017 1143   AST 35 02/25/2018 0725   AST 22 02/13/2018 1114   ALT 10 02/25/2018 0725   ALT 20 02/13/2018 1114   ALKPHOS 65 02/25/2018 0725   BILITOT 0.6 02/25/2018 0725   BILITOT 0.4 02/13/2018 1114   GFRNONAA 28 (L) 03/04/2018 1426   GFRNONAA 18 (L) 02/13/2018 1114   GFRAA 33 (L) 03/04/2018 1426   GFRAA 20 (L) 02/13/2018 1114   Lab Results  Component Value Date   CHOL  12/31/2009    148        ATP III CLASSIFICATION:  <200     mg/dL   Desirable  200-239  mg/dL   Borderline High  >=240    mg/dL   High          HDL 74 12/31/2009   LDLCALC  12/31/2009    47        Total Cholesterol/HDL:CHD Risk Coronary Heart Disease Risk Table                     Men   Women  1/2 Average Risk   3.4   3.3  Average Risk       5.0   4.4  2 X Average Risk   9.6   7.1  3 X Average Risk  23.4   11.0        Use the calculated Patient Ratio above  and the CHD Risk Table to determine the patient's CHD Risk.        ATP III CLASSIFICATION (LDL):  <100     mg/dL   Optimal  100-129  mg/dL   Near or Above                    Optimal  130-159  mg/dL   Borderline  160-189  mg/dL   High  >190     mg/dL   Very High   TRIG 134 12/31/2009   CHOLHDL 2.0 12/31/2009   Lab Results  Component Value Date   HGBA1C 7.1 (H) 02/24/2018   Lab Results  Component Value Date   VITAMINB12 617 09/26/2009   Lab Results  Component Value Date   TSH 2.908 02/25/2018      ASSESSMENT AND PLAN 78y.o. 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 Likely restless leg syndrome,  Peripheral neuropathy (HCC) Chronic kidney disease; Headache(784.0); gait abnormality . Postherpetic neuralgia following shingles in February 2018.  Patient failed lyrica and Topamax.  New complaint of daytime drowsiness. ESS 12.The patient is a current patient of Dr. Leonie Man who is out of the office today . This note is sent to the work in doctor.      PLAN: Continue Gabapentin 3 tabs daily Continue cerefolin Will order sleep evaluation for daytime drowsiness Continue to participate in memory stimulation activities Memory score is stable Follow up 6 to 8 months  I explained in particular the risks and ramifications of untreated moderate to severe OSA, especially with respect to cardiovascular disease  including congestive heart failure, difficult to treat hypertension, cardiac arrhythmias, or stroke. Even type 2 diabetes has, in part, been linked to untreated OSA. Symptoms of untreated OSA include daytime sleepiness, memory problems, mood irritability and mood disorder such as depression and anxiety, lack of energy, as well as recurrent headaches, especially morning headaches. We talked about trying to maintain a healthy lifestyle in general, as well as the importance of weight control. I encouraged the patient to eat healthy, exercise daily and keep well hydrated, to keep a scheduled bedtime and wake time routine, to not skip any meals and eat healthy snacks in between meals I spent 45 min  in total face to face time with the patient more than 50% of which was spent counseling and coordination of care, reviewing test results reviewing medications , hospital records and discussing and reviewing the diagnosis of postherpetic neuralgia mild cognitive impairment and new complaints of daytime drowsiness and untreated sleep apnea and further treatment options.  Multiple questions were answered, Dennie Bible, [facility], [facility], APRN  [facility] [address], [place], [place]  [address] [phone]

## 2018-04-05 ENCOUNTER — Other Ambulatory Visit: Payer: Self-pay | Admitting: *Deleted

## 2018-04-05 ENCOUNTER — Ambulatory Visit: Payer: Medicare Other | Admitting: Nurse Practitioner

## 2018-04-05 NOTE — Patient Outreach (Signed)
[place] [facility]) Care Management  04/05/2018  ARLAYNE LIGGINS [birth year] [id number]   CSW spoke with pt by phone today. She plans to see Dr Sharol Given tomorrow and is eager for follow up. She complains of pain to her "good leg" and hopes she can continue with her recovery/rehabilitation.  She denies any concerns or needs from CSW.  CSW will close referral and advise [facility] team and PCP.   Eduard Clos, MSW, [place] Worker  [place] [phone]

## 2018-04-06 ENCOUNTER — Encounter: Payer: Self-pay | Admitting: Nurse Practitioner

## 2018-04-06 ENCOUNTER — Ambulatory Visit (INDEPENDENT_AMBULATORY_CARE_PROVIDER_SITE_OTHER): Payer: Medicare Other | Admitting: Nurse Practitioner

## 2018-04-06 ENCOUNTER — Ambulatory Visit (INDEPENDENT_AMBULATORY_CARE_PROVIDER_SITE_OTHER): Payer: Medicare Other

## 2018-04-06 ENCOUNTER — Encounter (INDEPENDENT_AMBULATORY_CARE_PROVIDER_SITE_OTHER): Payer: Self-pay | Admitting: Orthopedic Surgery

## 2018-04-06 ENCOUNTER — Ambulatory Visit (INDEPENDENT_AMBULATORY_CARE_PROVIDER_SITE_OTHER): Payer: Medicare Other | Admitting: Orthopedic Surgery

## 2018-04-06 VITALS — BP 131/80 | HR 78

## 2018-04-06 VITALS — Ht 66.0 in | Wt 182.0 lb

## 2018-04-06 DIAGNOSIS — B0229 Other postherpetic nervous system involvement: Secondary | ICD-10-CM | POA: Diagnosis not present

## 2018-04-06 DIAGNOSIS — R400 Somnolence: Secondary | ICD-10-CM | POA: Diagnosis not present

## 2018-04-06 DIAGNOSIS — G3184 Mild cognitive impairment, so stated: Secondary | ICD-10-CM

## 2018-04-06 DIAGNOSIS — Z981 Arthrodesis status: Secondary | ICD-10-CM | POA: Diagnosis not present

## 2018-04-06 MED ORDER — GABAPENTIN 300 MG PO CAPS: 300.0000 mg | ORAL_CAPSULE | Freq: Two times a day (BID) | ORAL | 1 refills | Status: DC

## 2018-04-06 NOTE — Patient Instructions (Addendum)
  Continue Gabapentin 3 tabs daily Continue cerefolin Will order sleep evaluation for daytime drowsiness Continue to participate in memory stimulation activities Memory score is stable Follow up 6 to 8 months  I explained in particular the risks and ramifications of untreated moderate to severe OSA, especially with respect to cardiovascular disease  including congestive heart failure, difficult to treat hypertension, cardiac arrhythmias, or stroke. Even type 2 diabetes has, in part, been linked to untreated OSA. Symptoms of untreated OSA include daytime sleepiness, memory problems, mood irritability and mood disorder such as depression and anxiety, lack of energy, as well as recurrent headaches, especially morning headaches. We talked about trying to maintain a healthy lifestyle in general, as well as the importance of weight control.

## 2018-04-06 NOTE — Progress Notes (Signed)
made any corrections needed, and agree with history, physical, neuro exam,assessment and plan as stated.     Antonia Ahern, MD [facility]   

## 2018-04-07 ENCOUNTER — Other Ambulatory Visit: Payer: Self-pay | Admitting: *Deleted

## 2018-04-07 ENCOUNTER — Telehealth (INDEPENDENT_AMBULATORY_CARE_PROVIDER_SITE_OTHER): Payer: Self-pay | Admitting: Orthopedic Surgery

## 2018-04-07 NOTE — Telephone Encounter (Signed)
Ben -(PT) with [facility] called advised he spoke with patient's spouse and was told patient's weight status had changed. Ben asked if Dr Sharol Given want him to see the patient a couple more times. The number to contact Suezanne Jacquet is  [phone]

## 2018-04-07 NOTE — Patient Outreach (Signed)
[place] [facility]) Care Management  04/07/2018  [name] [birth year] [id number]   Transition of care call    Patient PMHx includes not not limited to : Chronic Diastolic heart failure, Diabetes, CKA, Hypertension, gout, mild cognitive disorder. Right breast CA.   Patient with recent admission to [facility] on 11/27 for right subtalar and talonavicular fusion  Noted readmission 11/29- 12/5 for sepsis, right severe foot pain , hypotension .  Patient discharged from [facility] on 12/24.   Successful outreach call to patient, she reports doing fine with everything going on.  Recent right ankle fusion surgery Patient reports visit of Dr.Duda on this week and states boot to right foot was removed and she was given the okay to begin walking, using her walker.  Patient reports that she is doing well with walking in home able to completed shower without assistance, husband/daughter  is present for stand by assist if needed.  She has been instructed to wear Dr.Duda compression hose, she has one pair and plans to order another.  Patient anticipates home health therapy evaluation on today for possible end of services, she feels that she is doing well. Patient reports 0/10 pain in right foot, but 4/10 in left knee area which she attributes to putting all weight on that side while non weight bearing on the right.  Diabetes Continues monitoring blood sugar daily with reading <150 range mostly reports one reading of 200 Heart Failure Patient has not tried standing yet to balance on scales, but she work toward this family present. Reports being in the green zone, some swelling in lower legs but not increased.   Patient discussed having symptoms of UTI, burning with urination , slightly cloudy urine, denies fever, discussed importance of MD follow up , with urine specimen and evaluation for need of antibiotic. Patient discussed her history of bladder problems and recurrent UTI. Patient  states that she will notify PCP or her urologist of symptoms.  Appointments Discussed PCP post discharge visit, patient attempting to establish with Dr. Philip Aspen if not able to she again agrees to follow up with Dr.Fusco.   Plan Will continue with weekly transition of care outreaches  [facility] CM Care Plan Problem One     Most Recent Value  Care Plan Problem One  Risk for readmission recent discharge from SNF related to ankle fusion surgery   Role Documenting the Problem One  Care Management [place] for Problem One  Active  [facility] Long Term Goal   Patient will not experience a hospital readmission in the next 60 days   THN Long Term Goal Start Date  03/23/18  Interventions for Problem One Long Term Goal  Reviewed current clinical state, advised notifying MD of current UTI symptoms to address with appropriate intervention to prevent readmission .   THN CM Short Term Goal #1   Patient will report attending all medical appointments over the next 30 days   THN CM Short Term Goal #1 Start Date  03/23/18  Interventions for Short Term Goal #1  Discussed recent orthopedic visit, Reinforced importance of follow up with PCP post discharge, offered assistance with scheduling visit   THN CM Short Term Goal #2   Over the next 30 days patient will be able to verbalize improvement in mobility   [place] CM Short Term Goal #2 Start Date  03/24/18  Interventions for Short Term Goal #2  Reviewed progress with mobiliy, reinforced safety and use of walker , encouraged to  continue exerecise taught by [facility] PT   THN CM Short Term Goal #3  Over the next 30 days patient will report decrease swelling in right leg over the next 30 days   THN CM Short Term Goal #3 Start Date  03/31/18  Interventions for Short Tern Goal #3  patient with wearing support hose as prescribed , elevating legs while sitting through out the day.       [name], RN, [facility] Management Coordinator  [phone]-  Mobile [phone]- Toll Free Main Office

## 2018-04-09 ENCOUNTER — Encounter (INDEPENDENT_AMBULATORY_CARE_PROVIDER_SITE_OTHER): Payer: Self-pay | Admitting: Orthopedic Surgery

## 2018-04-09 NOTE — Progress Notes (Signed)
Office Visit Note   Patient: CHATTIE GREESON           Date of Birth: Feb 03, 1941           MRN: [id number] Visit Date: 04/06/2018              Requested by: Redmond School, [place], [place] [address] PCP: Redmond School, MD  Chief Complaint  Patient presents with  . Right Ankle - Routine Post Op    Subtalar & Talonavicular Fusion 02/22/18      HPI: Patient is 6 weeks status post subtalar and talonavicular fusion.  She is weightbearing as tolerated with a walker and a fracture boot.  Patient states she has been having some left knee pain due to her changing gait.  She complains of increased swelling in both lower extremities.  Patient is on allopurinol and colchicine for her gout.  Assessment & Plan: Visit Diagnoses:  1. Status post ankle fusion     Plan: Patient will continue with weightbearing as tolerated.  With the massive swelling in both legs recommended following up with her medical doctor to evaluate her cardiac and renal function.  Will need to repeat her uric acid at follow-up.  Follow-Up Instructions: Return in about 4 weeks (around 05/04/2018).   Ortho Exam  Patient is alert, oriented, no adenopathy, well-dressed, normal affect, normal respiratory effort. Examination patient has increased swelling in both legs her right calf is 40 cm in circumference with pitting edema.  There is no redness no cellulitis no signs of infection.  Her foot is plantigrade.  Imaging: No results found. No images are attached to the encounter.  Labs: Lab Results  Component Value Date   HGBA1C 7.1 (H) 02/24/2018   HGBA1C 7.1 (H) 02/22/2018   HGBA1C 5.7 (H) 12/07/2016   LABURIC 9.0 (H) 02/25/2018   LABURIC 10.2 (H) 02/16/2018   LABURIC 9.1 (H) 01/19/2018   REPTSTATUS 02/26/2018 FINAL 02/25/2018   CULT  02/25/2018    NO GROWTH Performed at [facility], [place] [address], [place], [place] [address]    LABORGA ESCHERICHIA COLI 12/31/2009     Lab Results   Component Value Date   ALBUMIN 2.3 (L) 03/01/2018   ALBUMIN 2.3 (L) 02/27/2018   ALBUMIN 2.7 (L) 02/25/2018   LABURIC 9.0 (H) 02/25/2018   LABURIC 10.2 (H) 02/16/2018   LABURIC 9.1 (H) 01/19/2018    Body mass index is 29.38 kg/m.  Orders:  Orders Placed This Encounter  Procedures  . XR Ankle Complete Right   No orders of the defined types were placed in this encounter.    Procedures: No procedures performed  Clinical Data: No additional findings.  ROS:  All other systems negative, except as noted in the HPI. Review of Systems  Objective: Vital Signs: Ht 5' 6" (1.676 m)   Wt 182 lb (82.6 kg)   BMI 29.38 kg/m   Specialty Comments:  No specialty comments available.  PMFS History: Patient Active Problem List   Diagnosis Date Noted  . Somnolence, daytime 04/06/2018  . Acute on chronic diastolic CHF (congestive heart failure) ([place]) 02/28/2018  . SOB (shortness of breath)   . Hypotension   . Elevated troponin 02/25/2018  . Hypomagnesemia 02/25/2018  . Hypokalemia 02/24/2018  . Urinary retention 02/24/2018  . Sepsis ([place]) 02/24/2018  . Posterior tibial tendinitis of right leg 02/22/2018  . Posterior tibial tendinitis, right leg   . Right atrial mass   . Chronic diastolic heart failure (  [place]) 08/01/2017  . Genetic testing 12/03/2016  . 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 Malignant neoplasm of upper-outer quadrant of left breast in female, estrogen receptor positive ([place]) 11/19/2016  . Chronic idiopathic gout involving toe of left foot without tophus 11/15/2016  . Malignant 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, estrogen receptor positive ([place]) 11/09/2016  . Tendon tear, ankle, left, sequela   . Traumatic rupture of left anterior tibial tendon 04/20/2016  . Acute kidney injury superimposed on CKD ([place])   . 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 ([place])   . Headache, migraine   . Unexplained weight loss 11/17/2015  . CKD  (chronic kidney disease) stage 4, GFR 15-29 ml/min (HCC) 11/17/2015  . Restless legs syndrome 07/24/2015  . Mild cognitive impairment 01/08/2015  . Abnormality of gait 01/08/2015  . Dizziness 12/19/2014  . Preoperative cardiovascular examination 12/11/2013  . GERD (gastroesophageal reflux disease) 10/06/2013  . Syncope 10/05/2013  . Anxiety 10/28/2012    Class: Acute  . Bilateral lower extremity edema 10/28/2012  . CAD S/P percutaneous coronary angioplasty   . Essential hypertension   . Dyslipidemia, goal LDL below 70    Past Medical History:  Diagnosis Date  . Anemia    have received iron infusions  . Anxiety   . Arthritis     osteoarthritis  . Breast cancer ([place])   . Breast cancer in female [facility])    Bilateral  . CAD S/P percutaneous coronary angioplasty 2007; March 2011   Liberte' [employer] 3.0 mm 20 mm postdilated 3.6 mm in early mid LAD; status post ISR Cutting Balloon PTCA and March 11 along with PCI of distal mid lesion with a 3.0 mm 12 mm MultiLink vision BMS; the proximal stent causes jailing of SP1 and SP2 with ostial 70-80% lesions  . Cancer (HCC)    Melanoma, Squamous cell Carcinoma  . CHF (congestive heart failure) ([place])   . Chronic kidney disease    stage 2   . 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 Dementia ([place])    mild  . Diabetes mellitus type 2 with neurological manifestations ([place])   . Dyslipidemia, goal LDL below 70   . Full dentures   . Genetic testing 12/03/2016   Germline genetic testing was performed through [employer]'s Common Hereditary Cancers Panel + [employer]'s Melanoma Panel. This custom panel includes analysis of the following 51 genes: APC, ATM, AXIN2, BAP1, BARD1, BMPR1A, BRCA1, BRCA2, BRIP1, CDH1, CDK4, CDKN2A, CHEK2, CTNNA1, DICER1, EPCAM, GREM1, HOXB13, KIT, MEN1, MITF, MLH1, MSH2, MSH3, MSH6, MUTYH, NBN, NF1, NTHL1, PALB2, PDGFRA, PMS2, POLD1, POL  . GERD (gastroesophageal reflux disease)   . Headache(784.0)   .  History of hematuria    Followed by Dr. Gaynelle Arabian  . Hypertension, essential, benign   . Nausea & vomiting 10/2015  . Peripheral neuropathy   . Stroke ([place])   . TIA (transient ischemic attack)    multiple in the past  . Wears glasses     Family History  Problem Relation Age of Onset  . Diabetes Mother   . Hypertension Mother   . Kidney disease Mother   . Hypertension Father   . Emphysema Father   . Heart attack Father   . Heart disease Father   . Arthritis Sister   . Diabetes Sister   . Hypertension Sister   . Breast cancer Sister 55       treated with mastectomy  . Cervical cancer Sister 63  .  Hypertension Brother   . Hyperlipidemia Brother   . Diabetes Brother   . Stroke Brother   . Diabetes Sister   . Hypertension Sister   . Stomach cancer Sister 72  . Hypertension Brother   . ALS Brother        d.62s  . Breast cancer Daughter 52       triple negative treated with lumpectomy, chemo, radiation  . Heart attack Daughter 4       d.45  . COPD Daughter   . Cancer Paternal Aunt        unspecified type  . Cancer Paternal Uncle        unspecified type  . Cancer Paternal Uncle        unspecified type    Past Surgical History:  Procedure Laterality Date  . ABDOMINAL HYSTERECTOMY    . ANKLE FUSION Right 02/22/2018   Procedure: RIGHT SUBTALAR AND TALONAVICULAR FUSION;  Surgeon: Newt Minion, MD;  Location: [place];  Service: Orthopedics;  Laterality: Right;  . APPENDECTOMY    . BLADDER SUSPENSION    . BREAST LUMPECTOMY Bilateral 2018  . BREAST LUMPECTOMY WITH RADIOACTIVE SEED AND SENTINEL LYMPH NODE BIOPSY Bilateral 12/14/2016   Procedure: BILATERAL BREAST LUMPECTOMY WITH BILATERAL  RADIOACTIVE SEED AND BILATERAL AXILLARY  SENTINEL LYMPH NODE BIOPSY ERAS PATHWAY;  Surgeon: Alphonsa Overall, MD;  Location: [place];  Service: General;  Laterality: Bilateral;  PECTORAL BLOCK  . CARDIAC CATHETERIZATION  02/24/2006   85% stenosis in the proximal portion of LAD-arrangements made  for PCI on 02/25/2006  . CARDIAC CATHETERIZATION  02/25/2006   80% LAD lesion stented with a 3x66m Liberte stent resulting in reduction of 80% lesion to 0% residual  . CARDIAC CATHETERIZATION  04/26/2006   Medical management  . CARDIAC CATHETERIZATION  06/24/2009   60-70% re-stenosis in the proximal LAD. A 3.25x15 cutting balloon, 3 inflations - 14atm-38sec, 13atm-39sec, and 12atm-40sec reduced to less than 10%. 60% stenosis of the mid/distal LAD stented with a 3x133mMultilink stent.  . Marland KitchenARDIAC CATHETERIZATION  07/09/2009   Medical management  . CAROTID DOPPLER  06/24/2009   40-59% R ICA stenosis. No significant ICA stenosis noted  . CHOLECYSTECTOMY    . DILATION AND CURETTAGE OF UTERUS    . JOINT REPLACEMENT Left   . LESION REMOVAL Right 03/11/2015   Procedure:  EXCISION OF RIGHT PRE TIBIAL LESION;  Surgeon: JaJudeth HornMD;  [place] Service: General;  Laterality: Right;  . MASS EXCISION Left 06/10/2014   Procedure: EXCISION LEFT LOWER LEG LESION;  Surgeon: JaDoreen SalvageMD;  [place] Service: General;  Laterality: Left;  . Marland KitchenASS EXCISION Left 09/05/2015   Procedure: EXCISION OF LEFT FOREARM  MASS;  Surgeon: JaJudeth HornMD;  [place] [facility] Service: General;  Laterality: Left;  . NM MYOVIEW LTD  11/16/2011   5 beats a PVC during recovery.  No skin or infarction.  Reached 5 METs, EKG negative for ischemia   . NM MYOVIEW LTD  10/2011   No ischemia or infarction.  Normal EF.  LOW RISK. (Short bursts of 5 beats NSVT during recovery otherwise normal)  . SHOULDER ARTHROSCOPY  10/15  . SHOULDER ARTHROSCOPY  1995   left  . TEE WITHOUT CARDIOVERSION N/A 08/03/2017   Procedure: TRANSESOPHAGEAL ECHOCARDIOGRAM (TEE);  Surgeon: RaSkeet LatchMD;  [place] [facility] Service: Cardiovascular;; EF 60-65% with no wall motion normalities.  No atrial thrombus noted.  Lightly findings consistent with a lipomatous hypertrophy of  the atrial septum.   . TENDON REPAIR Left 05/12/2016   Procedure: Left Anterior Tibial Tendon Reconstruction;  Surgeon: Newt Minion, MD;  Location: [place];  Service: Orthopedics;  Laterality: Left;  . TOTAL KNEE ARTHROPLASTY  2005   left  . TRANSESOPHAGEAL ECHOCARDIOGRAM  08/03/2016   : EF 60-65% no wall motion normalities.  No atrial thrombus.  Lipomatous hypertrophy of the atrial septum.  No mass noted.  . TRANSTHORACIC ECHOCARDIOGRAM  10/2015   EF 55-60 %. Mild LVH. Mild pulmonary hypertension. Normal valves.  . TRANSTHORACIC ECHOCARDIOGRAM  07/2017   Vigorous LV function.  EF 65-70%.  Mild diastolic dysfunction with no RWMA. GR 1 DD.  Mild aortic sclerosis/no stenosis.  Questionable right atrial mass discounted with TEE)   . WRIST ARTHROPLASTY  2010   cancer lt wrist   Social History   Occupational History  . Occupation: retired    Fish farm manager: RETIRED  Tobacco Use  . Smoking status: Never Smoker  . Smokeless tobacco: Never Used  Substance and Sexual Activity  . Alcohol use: No  . Drug use: No  . Sexual activity: Never

## 2018-04-10 ENCOUNTER — Telehealth (INDEPENDENT_AMBULATORY_CARE_PROVIDER_SITE_OTHER): Payer: Self-pay

## 2018-04-10 NOTE — Telephone Encounter (Signed)
I called and spoke to [place] (PT) with [facility] and agreed to continue home health with patient for 2 times a week for the next 6 weeks and he will report a follow-up on her weight bearing status then, per Dr Sharol Given.

## 2018-04-10 NOTE — Telephone Encounter (Signed)
I called and spoke to [place] (PT) with [facility] and agreed to continue home health with patient for 2 times a week for the next 6 weeks and he will report a follow-up on her weight bearing status then, per Dr Sharol Given.

## 2018-04-11 DIAGNOSIS — E1122 Type 2 diabetes mellitus with diabetic chronic kidney disease: Secondary | ICD-10-CM | POA: Diagnosis not present

## 2018-04-11 DIAGNOSIS — I5030 Unspecified diastolic (congestive) heart failure: Secondary | ICD-10-CM | POA: Diagnosis not present

## 2018-04-11 DIAGNOSIS — Z4789 Encounter for other orthopedic aftercare: Secondary | ICD-10-CM | POA: Diagnosis not present

## 2018-04-11 DIAGNOSIS — I130 Hypertensive heart and chronic kidney disease with heart failure and stage 1 through stage 4 chronic kidney disease, or unspecified chronic kidney disease: Secondary | ICD-10-CM | POA: Diagnosis not present

## 2018-04-11 DIAGNOSIS — C50411 Malignant neoplasm of upper-outer quadrant of right female breast: Secondary | ICD-10-CM | POA: Diagnosis not present

## 2018-04-11 DIAGNOSIS — F0390 Unspecified dementia without behavioral disturbance: Secondary | ICD-10-CM | POA: Diagnosis not present

## 2018-04-13 ENCOUNTER — Other Ambulatory Visit: Payer: Self-pay | Admitting: *Deleted

## 2018-04-13 NOTE — Patient Outreach (Signed)
[name]) Care Management  04/13/2018  [name] 1941/03/04 [id number]   Transition of care call   Patient PMHx includes not not limited to : Chronic Diastolic heart failure, Diabetes, CKA, Hypertension, gout, mild cognitive disorder. Right breast CA,   Patient with recent admission to [facility] on 11/27 for right subtalar and talonavicular fusion  Noted readmission 11/29- 12/5 for sepsis, right severe foot pain , hypotension .  Patient discharged from [facility] on 12/24.   Successful outreach call to patient , she complains of feeling tired today, complaining of right foot pain and left hip discomfort.  Patient discussed that she has taken her pain medication and resting in bed with her feet elevated .  Patient continues to wear her support hose and reports decreased swelling in lower legs.  Patient discussed home physical therapy visit on this week and good information for progressing with mobility.  Patient continues to tolerate moving around home, using walker, on in wheelchair.  Patient reports still too wobbly with balancing on scale to monitor weight. She reports continuing to limit salt and maintain less than 2 liter fluid limit.   Appointments Patient has not scheduled  post discharge visit with PCP yet as previously encouraged, she states that she was waiting to hear back from  [name] whether they would take her on as a new patient. Discussed with patient if they are able to take her on as a patient, it may be a delay in her initial visit and it would be best to see her established PCP, patient agrees to contact Dr.Fusco.  Offered to call [name] regarding patient being established , spoke with [facility] that will follow up on Dr. Philip Aspen being able to accept . Return call to patient to update her on conversation.  Received return call form [name] at [name] and they will not be able to accept as patient , Dr.Paterson  schedule booked  Return call to patient to notify.  . Encouraged patient to speak with [name] if additional concerns. She agreed to contact Dr.Fusco [name] today for an appointment in next week.    Plan  Will continue with weekly transition of care calls, explained to patient coworker with make contact with her over the next 2 weeks.  Reinforced  with patient importance of follow up with MD after discharge.  [name] CM Care Plan Problem One     Most Recent Value  Care Plan Problem One  Risk for readmission recent discharge from SNF related to ankle fusion surgery   Role Documenting the Problem One  Care Management [place] for Problem One  Active  [facility] Long Term Goal   Patient will not experience a hospital readmission in the next 60 days   THN Long Term Goal Start Date  03/23/18  Interventions for Problem One Long Term Goal  Discussed current clinical state, reinforced prn medications as needed for pain control to help with activity tolerance.   THN CM Short Term Goal #1   Patient will report attending all medical appointments over the next 30 days   THN CM Short Term Goal #1 Start Date  03/23/18  Interventions for Short Term Goal #1  Reinforced importance of medical follow up after discharge from SNF, Urged patient regarding scheduling PCP visit , offered to call,   THN CM Short Term Goal #2   Over the next 30 days patient will be able to verbalize improvement in mobility  THN CM Short Term Goal #2 Start Date  03/24/18 [goal extended due to progress ]  Interventions for Short Term Goal #2  Encouraged patient with safety with mobility , reinforced continued exercise plan per home health therapy   THN CM Short Term Goal #3  Over the next 30 days patient will report decrease swelling in right leg over the next 30 days   THN CM Short Term Goal #3 Start Date  03/31/18  Interventions for Short Tern Goal #3  Reviewed best positon when elevating legs above level of heart to reduce  swelling , continue ot wear support hose.       [name], RN, [place] Management Coordinator  [phone]- Mobile [phone]- Toll Free Main [name]

## 2018-04-15 IMAGING — MG MM CLIP PLACEMENT
2 series · 2 of 2 positions shown · non-contrast
Comparison: Previous exam(s).

CLINICAL DATA: Post ultrasound-guided core needle biopsy of right
breast 11:30 o'clock mass.

EXAM:
DIAGNOSTIC RIGHT MAMMOGRAM POST ULTRASOUND BIOPSY

[R ML]
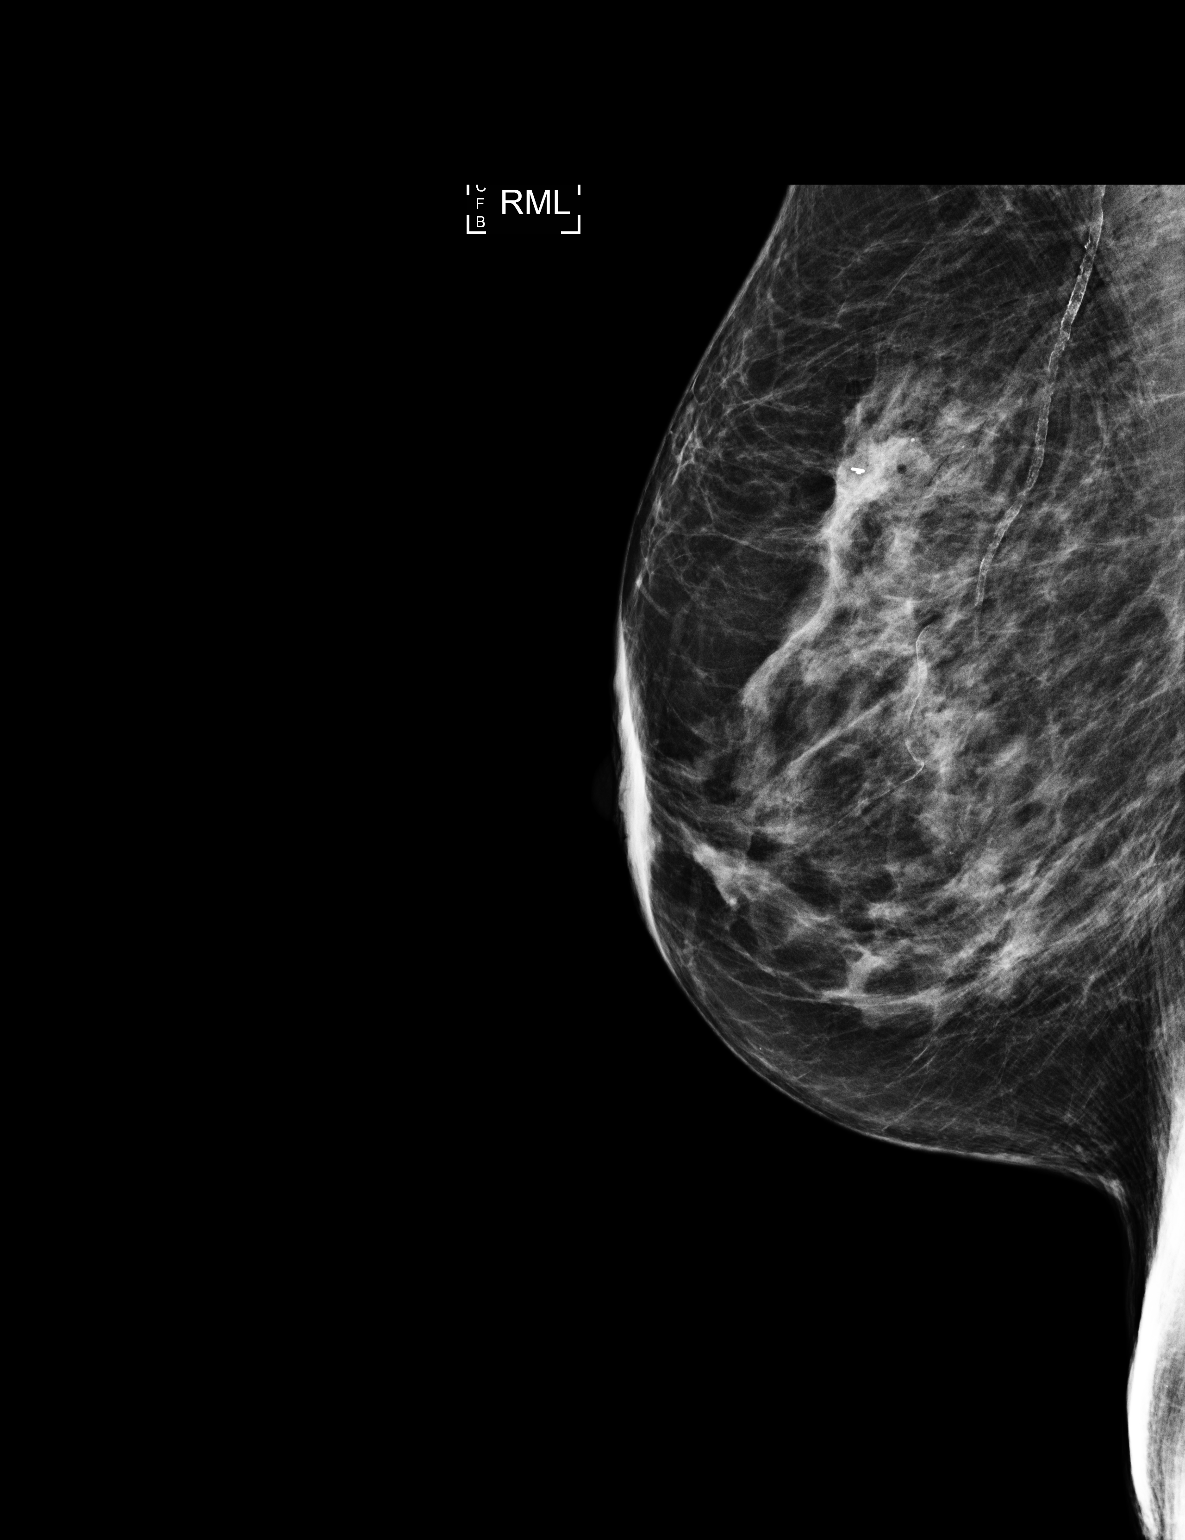

[R CC]
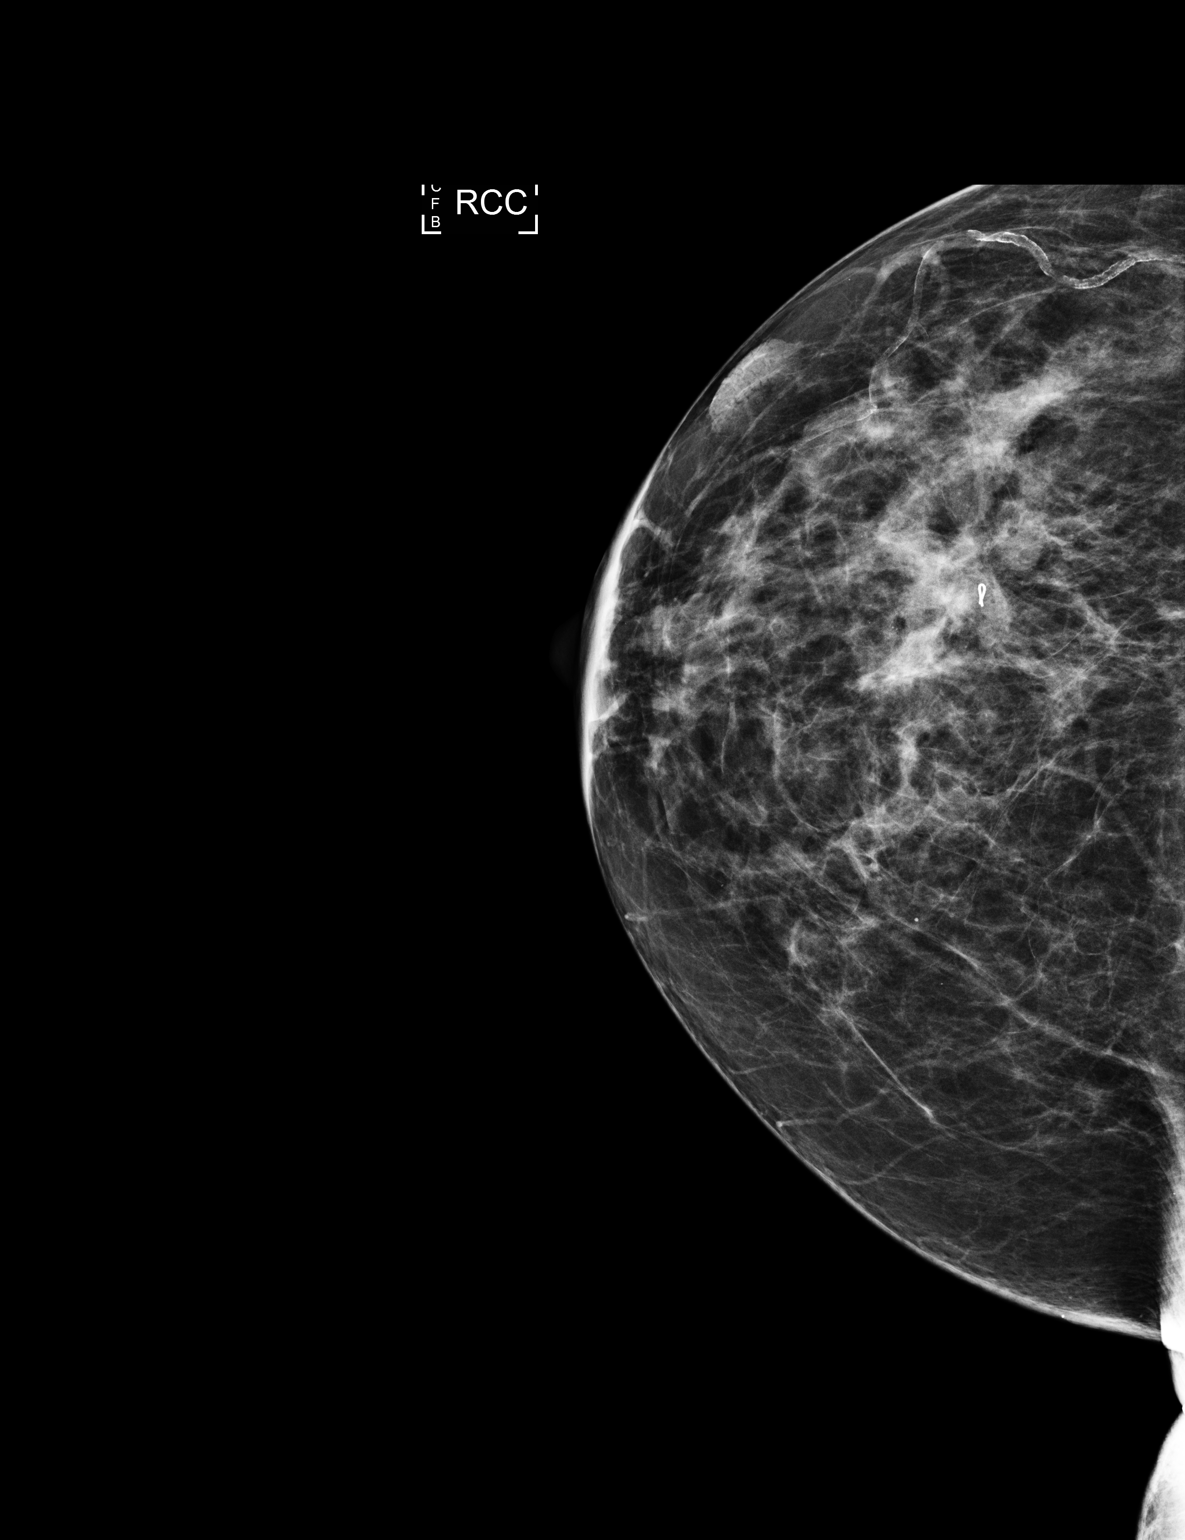

[2 of 2 positions shown; findings below may reference images not displayed]

FINDINGS: Mammographic images were obtained following ultrasound guided biopsy
of right breast 11:30 o'clock mass. Two-view mammography
demonstrates presence of ribbon shaped marker within the biopsy site
in appropriate radiographic position.
IMPRESSION: Successful placement of ribbon shaped marker within the right
breast, post ultrasound-guided core needle biopsy.

Final Assessment: Post Procedure Mammograms for Marker Placement

## 2018-04-15 IMAGING — MG 2D DIGITAL DIAGNOSTIC UNILATERAL RIGHT MAMMOGRAM WITH CAD AND AD
6 series · 6 of 14 positions shown · non-contrast
Comparison: 10/12/2016 and earlier

CLINICAL DATA: Patient returns after screening study for evaluation
of possible right breast distortion. The patient's daughter and
sister were diagnosed with breast cancer.

EXAM:
2D DIGITAL DIAGNOSTIC RIGHT MAMMOGRAM WITH CAD AND ADJUNCT TOMO
ULTRASOUND RIGHT BREAST

[R CC]
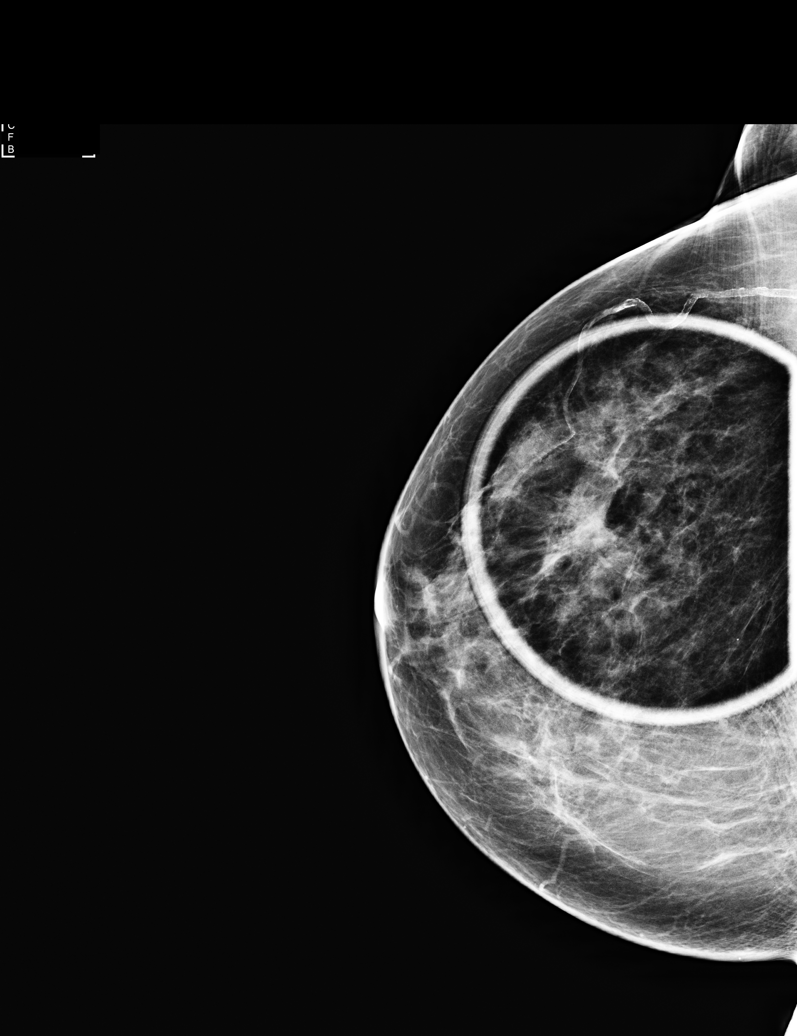

[R CC synth-2D]
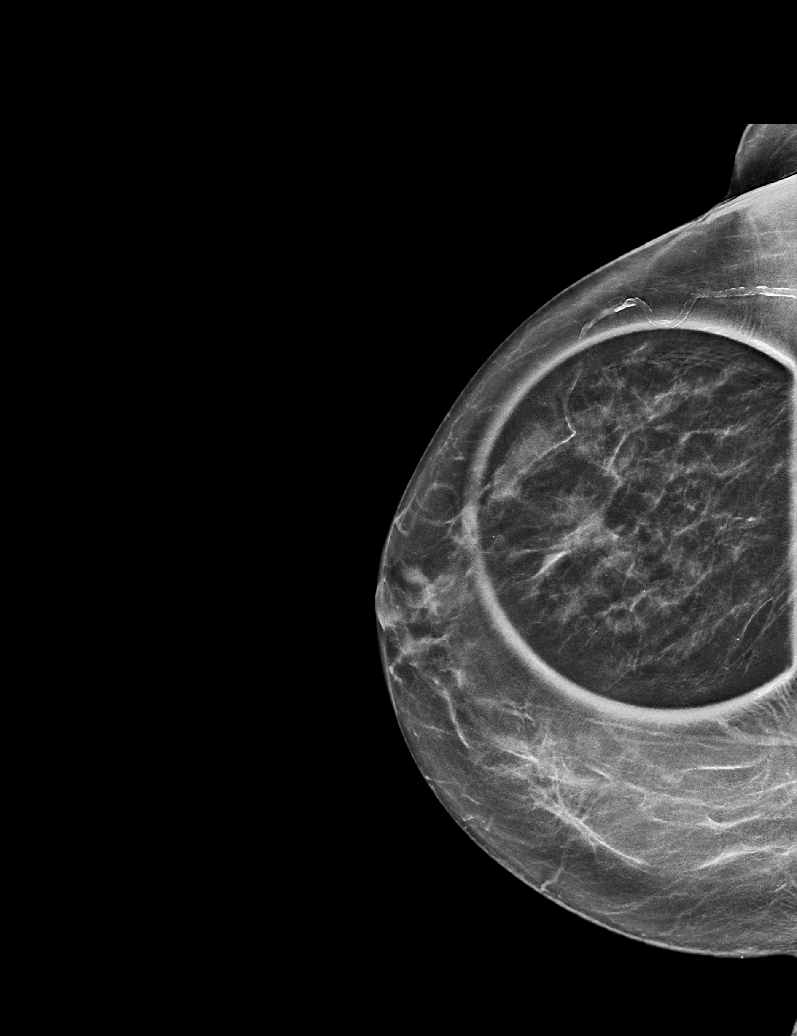

[R MLO]
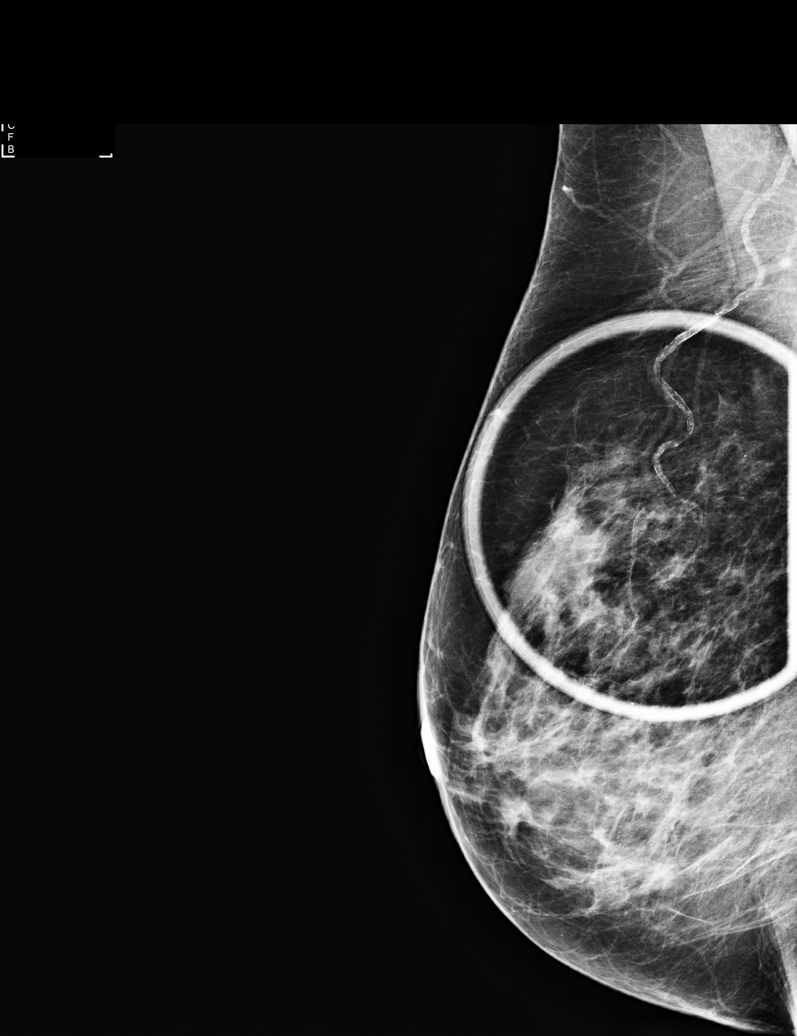

[R MLO synth-2D]
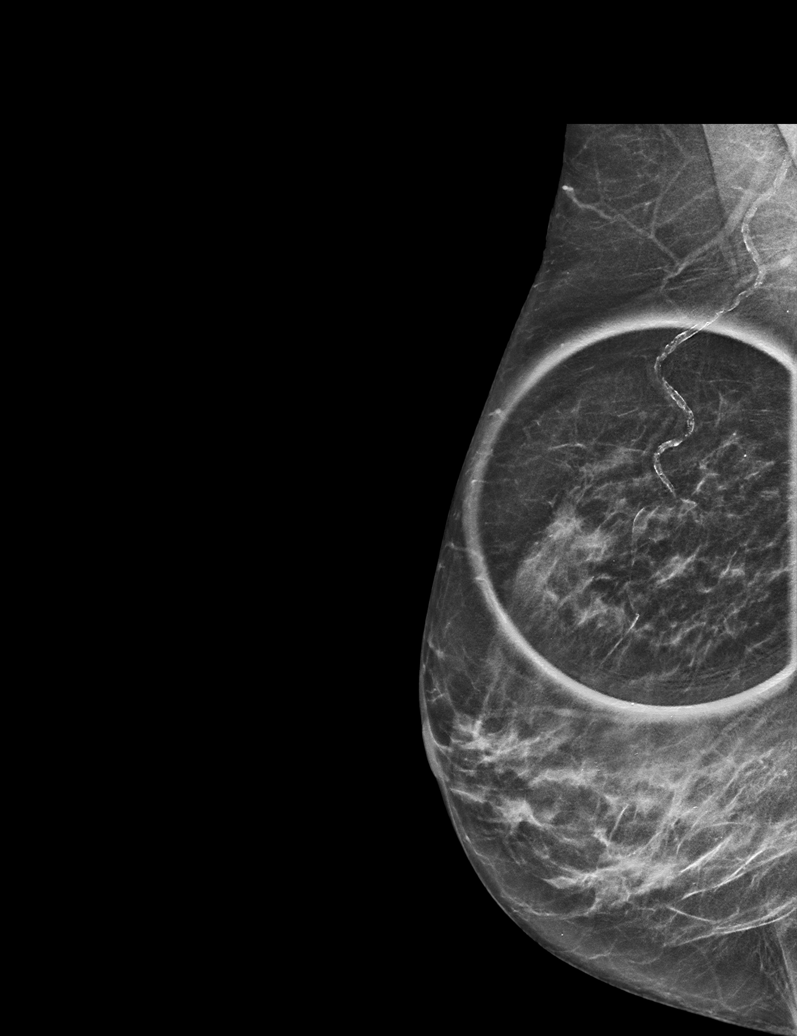

[R CC tomo · tomo slice 28/55.0]
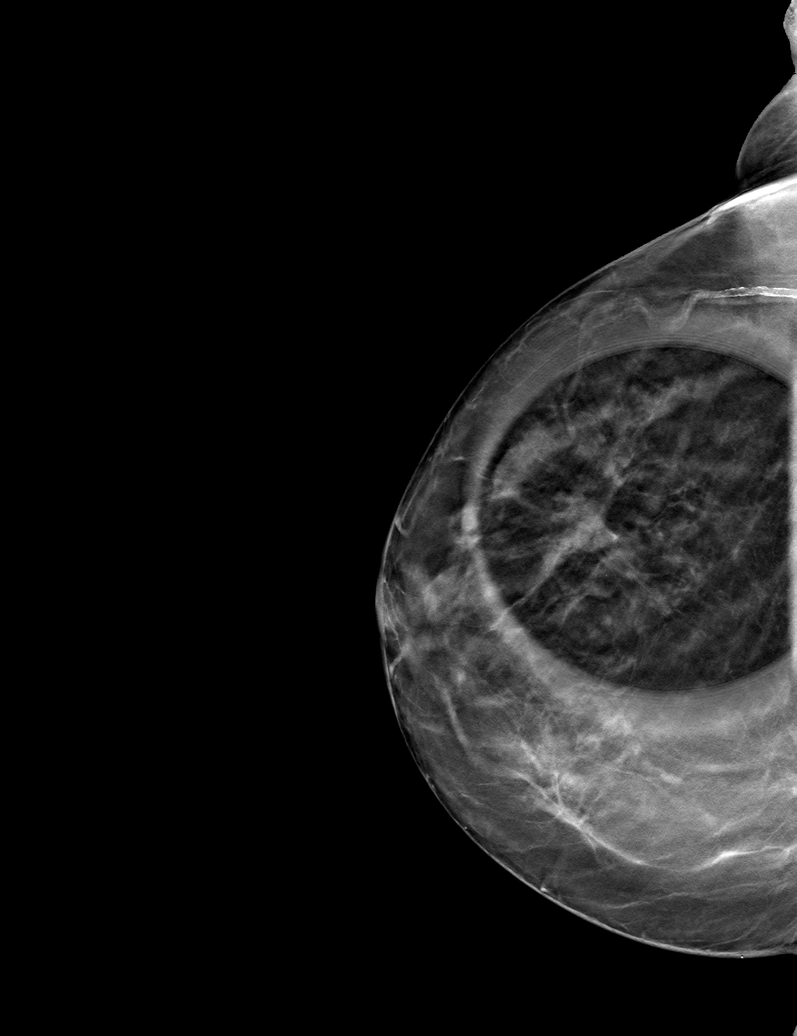

[R MLO tomo · tomo slice 27/53.0]
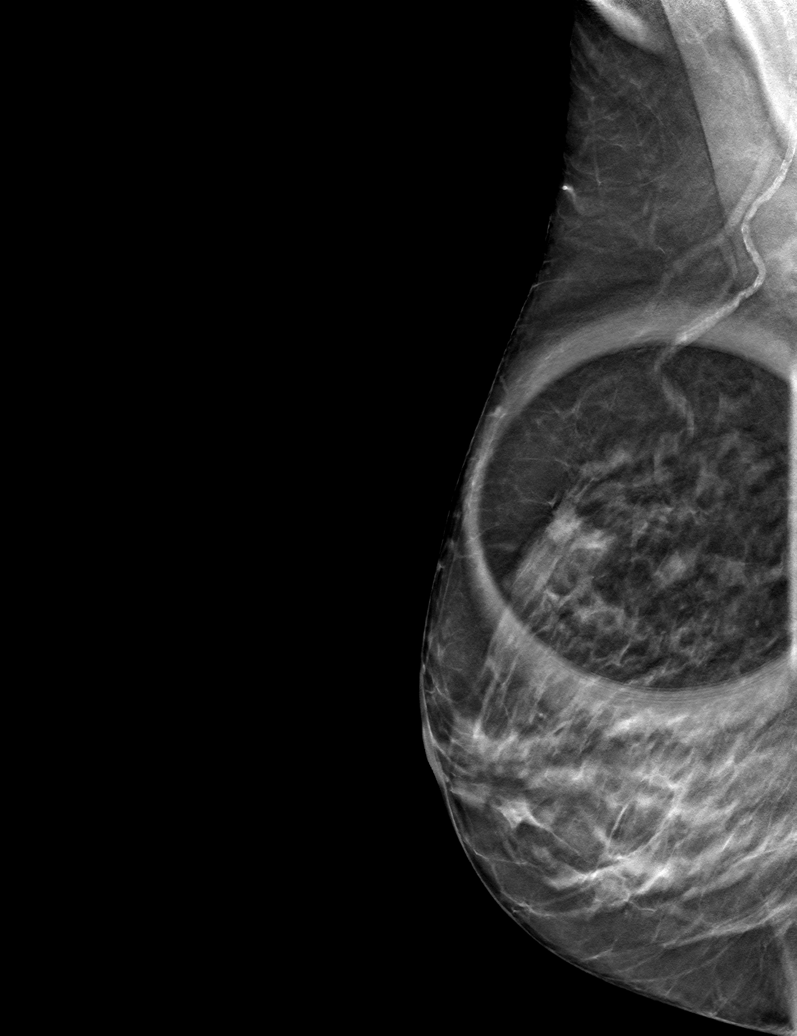

[6 of 14 positions shown; findings below may reference images not displayed]

ACR Breast Density Category c: The breast tissue is heterogeneously
dense, which may obscure small masses.
FINDINGS: Additional 2-D and 3-D images are performed. These images
demonstrate persistent distortion in the upper-outer quadrant of the
right breast.

Mammographic images were processed with CAD.

On physical exam, I palpate soft focal thickening in the 11 o'clock
location of the right breast. I palpate no axillary adenopathy.

Targeted ultrasound is performed, showing an irregular hypoechoic
mass with posterior acoustic shadowing in the 11:30 o'clock location
of the right breast 4 cm from the nipple which measures 0.9 x 0.9 x
0.8 cm. There is associated internal vascularity. Evaluation of the
axilla is negative for adenopathy.
IMPRESSION: 1. Persistent area of distortion corresponds to a vaguely palpable
irregular mass and is suspicious for malignancy.
2. No adenopathy by ultrasound.

RECOMMENDATION:
Ultrasound-guided core biopsy of mass in the 11:30 o'clock location
of the right breast.

I have discussed the findings and recommendations with the patient.
Results were also provided in writing at the conclusion of the
visit. If applicable, a reminder letter will be sent to the patient
regarding the next appointment.

BI-RADS CATEGORY  4: Suspicious.

## 2018-04-18 DIAGNOSIS — Z4789 Encounter for other orthopedic aftercare: Secondary | ICD-10-CM | POA: Diagnosis not present

## 2018-04-18 DIAGNOSIS — E1122 Type 2 diabetes mellitus with diabetic chronic kidney disease: Secondary | ICD-10-CM | POA: Diagnosis not present

## 2018-04-18 DIAGNOSIS — I130 Hypertensive heart and chronic kidney disease with heart failure and stage 1 through stage 4 chronic kidney disease, or unspecified chronic kidney disease: Secondary | ICD-10-CM | POA: Diagnosis not present

## 2018-04-18 DIAGNOSIS — F0390 Unspecified dementia without behavioral disturbance: Secondary | ICD-10-CM | POA: Diagnosis not present

## 2018-04-18 DIAGNOSIS — I5030 Unspecified diastolic (congestive) heart failure: Secondary | ICD-10-CM | POA: Diagnosis not present

## 2018-04-18 DIAGNOSIS — C50411 Malignant neoplasm of upper-outer quadrant of right female breast: Secondary | ICD-10-CM | POA: Diagnosis not present

## 2018-04-19 ENCOUNTER — Other Ambulatory Visit: Payer: Self-pay

## 2018-04-19 NOTE — Patient Outreach (Signed)
Transition of care: Covering for Landis Martins RN  Placed call to patient who reports that she is having a bad day. Reports increased swelling and pain to her legs. Right worse than left.  Reports she is miserable today. Reports she is laying in the bed with her feet propped up on pillows.  Reports she has been walking with a walker.  Patient states that she does not believe her foot is infected.   Patient reports recent CBG readings of 90-125.  Continues to take her medications as prescribed.   Continues to not be able to weigh at this time.  PLAN: encouraged patient to take her pain medications.  Encouraged patient to keep her follow up appointment for tomorrow.  Encouraged patient to call me for any concerns.  Tomasa Rand, RN, BSN, CEN [facility] [employer] [phone]

## 2018-04-20 DIAGNOSIS — I130 Hypertensive heart and chronic kidney disease with heart failure and stage 1 through stage 4 chronic kidney disease, or unspecified chronic kidney disease: Secondary | ICD-10-CM | POA: Diagnosis not present

## 2018-04-20 DIAGNOSIS — M069 Rheumatoid arthritis, unspecified: Secondary | ICD-10-CM | POA: Diagnosis not present

## 2018-04-20 DIAGNOSIS — I2510 Atherosclerotic heart disease of native coronary artery without angina pectoris: Secondary | ICD-10-CM | POA: Diagnosis not present

## 2018-04-20 DIAGNOSIS — E1122 Type 2 diabetes mellitus with diabetic chronic kidney disease: Secondary | ICD-10-CM | POA: Diagnosis not present

## 2018-04-20 DIAGNOSIS — F0390 Unspecified dementia without behavioral disturbance: Secondary | ICD-10-CM | POA: Diagnosis not present

## 2018-04-20 DIAGNOSIS — N184 Chronic kidney disease, stage 4 (severe): Secondary | ICD-10-CM | POA: Diagnosis not present

## 2018-04-20 DIAGNOSIS — I509 Heart failure, unspecified: Secondary | ICD-10-CM | POA: Diagnosis not present

## 2018-04-20 DIAGNOSIS — E118 Type 2 diabetes mellitus with unspecified complications: Secondary | ICD-10-CM | POA: Diagnosis not present

## 2018-04-20 DIAGNOSIS — I5030 Unspecified diastolic (congestive) heart failure: Secondary | ICD-10-CM | POA: Diagnosis not present

## 2018-04-20 DIAGNOSIS — Z6830 Body mass index (BMI) 30.0-30.9, adult: Secondary | ICD-10-CM | POA: Diagnosis not present

## 2018-04-20 DIAGNOSIS — K219 Gastro-esophageal reflux disease without esophagitis: Secondary | ICD-10-CM | POA: Diagnosis not present

## 2018-04-20 DIAGNOSIS — I10 Essential (primary) hypertension: Secondary | ICD-10-CM | POA: Diagnosis not present

## 2018-04-20 DIAGNOSIS — C50411 Malignant neoplasm of upper-outer quadrant of right female breast: Secondary | ICD-10-CM | POA: Diagnosis not present

## 2018-04-20 DIAGNOSIS — S99911S Unspecified injury of right ankle, sequela: Secondary | ICD-10-CM | POA: Diagnosis not present

## 2018-04-20 DIAGNOSIS — Z4789 Encounter for other orthopedic aftercare: Secondary | ICD-10-CM | POA: Diagnosis not present

## 2018-04-24 DIAGNOSIS — I5030 Unspecified diastolic (congestive) heart failure: Secondary | ICD-10-CM | POA: Diagnosis not present

## 2018-04-24 DIAGNOSIS — Z4789 Encounter for other orthopedic aftercare: Secondary | ICD-10-CM | POA: Diagnosis not present

## 2018-04-24 DIAGNOSIS — C50411 Malignant neoplasm of upper-outer quadrant of right female breast: Secondary | ICD-10-CM | POA: Diagnosis not present

## 2018-04-24 DIAGNOSIS — I130 Hypertensive heart and chronic kidney disease with heart failure and stage 1 through stage 4 chronic kidney disease, or unspecified chronic kidney disease: Secondary | ICD-10-CM | POA: Diagnosis not present

## 2018-04-24 DIAGNOSIS — E1122 Type 2 diabetes mellitus with diabetic chronic kidney disease: Secondary | ICD-10-CM | POA: Diagnosis not present

## 2018-04-24 DIAGNOSIS — F0390 Unspecified dementia without behavioral disturbance: Secondary | ICD-10-CM | POA: Diagnosis not present

## 2018-04-26 ENCOUNTER — Other Ambulatory Visit: Payer: Self-pay

## 2018-04-26 DIAGNOSIS — F0390 Unspecified dementia without behavioral disturbance: Secondary | ICD-10-CM | POA: Diagnosis not present

## 2018-04-26 DIAGNOSIS — C50411 Malignant neoplasm of upper-outer quadrant of right female breast: Secondary | ICD-10-CM | POA: Diagnosis not present

## 2018-04-26 DIAGNOSIS — E1122 Type 2 diabetes mellitus with diabetic chronic kidney disease: Secondary | ICD-10-CM | POA: Diagnosis not present

## 2018-04-26 DIAGNOSIS — I130 Hypertensive heart and chronic kidney disease with heart failure and stage 1 through stage 4 chronic kidney disease, or unspecified chronic kidney disease: Secondary | ICD-10-CM | POA: Diagnosis not present

## 2018-04-26 DIAGNOSIS — Z4789 Encounter for other orthopedic aftercare: Secondary | ICD-10-CM | POA: Diagnosis not present

## 2018-04-26 DIAGNOSIS — I5030 Unspecified diastolic (congestive) heart failure: Secondary | ICD-10-CM | POA: Diagnosis not present

## 2018-04-26 NOTE — Patient Outreach (Signed)
Transition of care:  Placed call to patient for transition of care. No answer. Left a message requesting a call back,  PLAN: will call back tomorrow.  Tomasa Rand, RN, BSN, CEN [facility] [employer] [phone]

## 2018-04-27 ENCOUNTER — Other Ambulatory Visit: Payer: Self-pay

## 2018-04-27 NOTE — Patient Outreach (Signed)
Transition of care:  Placed call to patient.  No answer. Left message requesting a call back.  PLAN: will mail letter and call again in 3 business days.  Tomasa Rand, RN, BSN, CEN [facility] [employer] [phone]

## 2018-04-28 ENCOUNTER — Other Ambulatory Visit: Payer: Self-pay

## 2018-04-28 ENCOUNTER — Other Ambulatory Visit (INDEPENDENT_AMBULATORY_CARE_PROVIDER_SITE_OTHER): Payer: Self-pay | Admitting: Physician Assistant

## 2018-04-28 ENCOUNTER — Telehealth (INDEPENDENT_AMBULATORY_CARE_PROVIDER_SITE_OTHER): Payer: Self-pay | Admitting: Orthopedic Surgery

## 2018-04-28 ENCOUNTER — Telehealth (INDEPENDENT_AMBULATORY_CARE_PROVIDER_SITE_OTHER): Payer: Self-pay

## 2018-04-28 MED ORDER — OXYCODONE-ACETAMINOPHEN 2.5-325 MG PO TABS: 1.0000 | ORAL_TABLET | Freq: Four times a day (QID) | ORAL | 0 refills | Status: DC | PRN

## 2018-04-28 NOTE — Telephone Encounter (Signed)
Merrilee Seashore from [employer] called to advise that the pt rx for percocet 2.5/325 is not in stock and wanted to know if it would be ok to changed to 5/325. Ok per [place] to call and advise of change. Pharm has been made aware.

## 2018-04-28 NOTE — Telephone Encounter (Signed)
Please advise. Pt last refill was 02/23/18 Qty #30 for 5 days. Thank you.

## 2018-04-28 NOTE — Telephone Encounter (Signed)
PT husband came into the office requesting a refill on oxycodone.

## 2018-04-28 NOTE — Telephone Encounter (Signed)
Pt requested It to be sent to [facility] on [address]

## 2018-04-28 NOTE — Patient Outreach (Signed)
Transition of care: Voicemail left from patient requesting a return call. Returned call and no answer. Left a message requesting patient call back.  PLAN: will attempt again.  Tomasa Rand, RN, BSN, CEN [facility] [employer] [phone]

## 2018-04-28 NOTE — Telephone Encounter (Signed)
Sent to pharmacy 

## 2018-04-28 NOTE — Telephone Encounter (Signed)
Pt was called and notified of Rx sent to pharmacy.

## 2018-04-30 IMAGING — DX DG FOOT COMPLETE 3+V*L*
3 series · 3 of 3 positions shown · non-contrast
Comparison: MRI of the left foot of 02/23/2016

CLINICAL DATA: Puncture wound under the great toe with swelling and
redness

EXAM:
LEFT FOOT - COMPLETE 3+ VIEW

[foot ap]
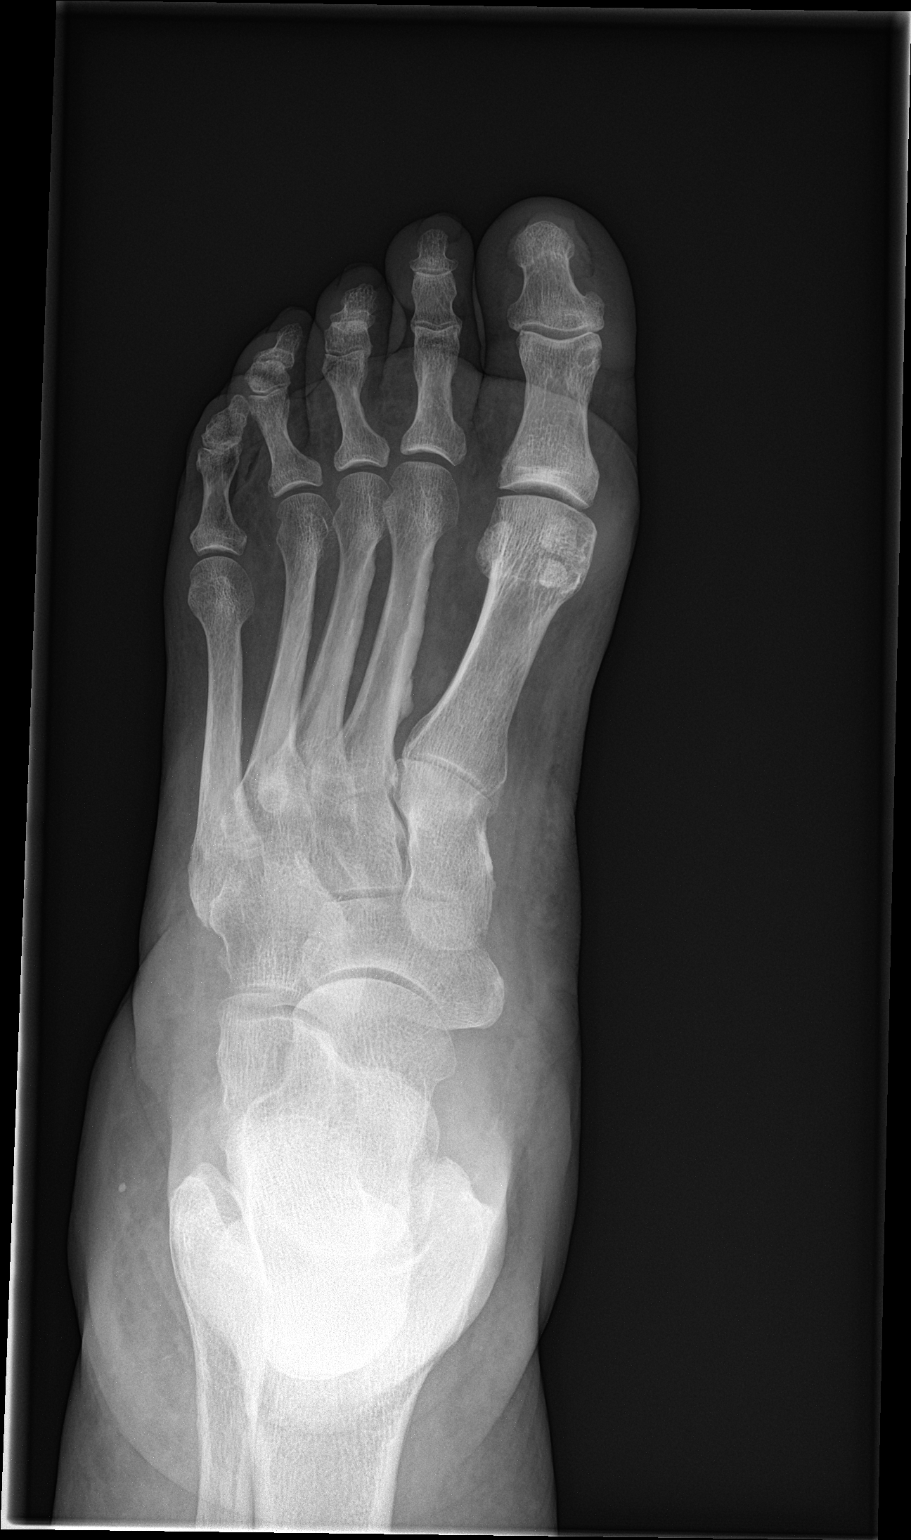

[foot obl]
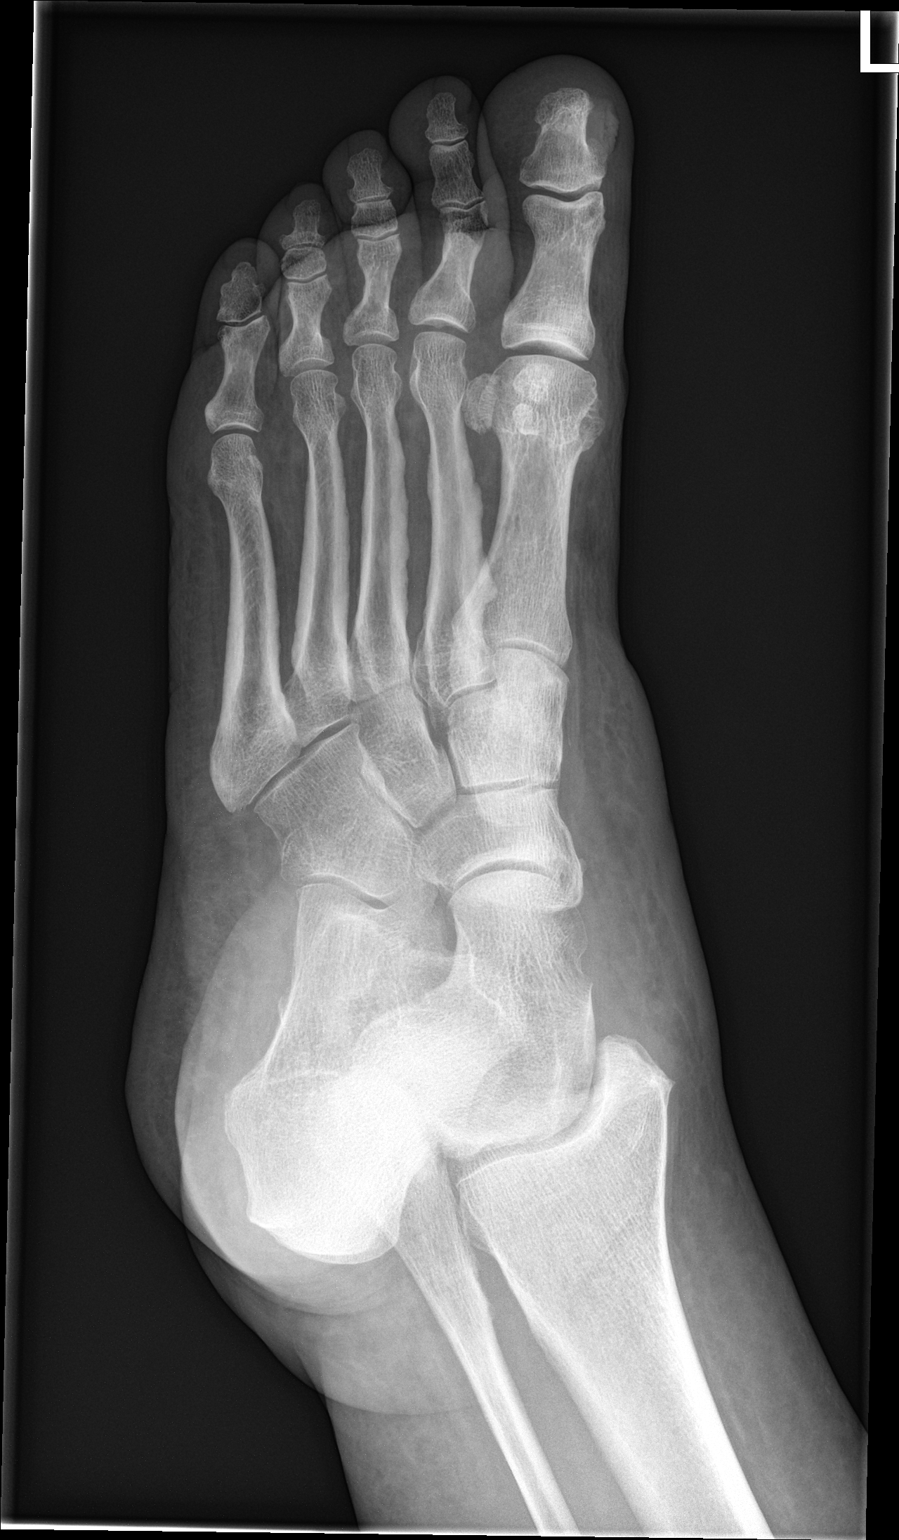

[foot lat]
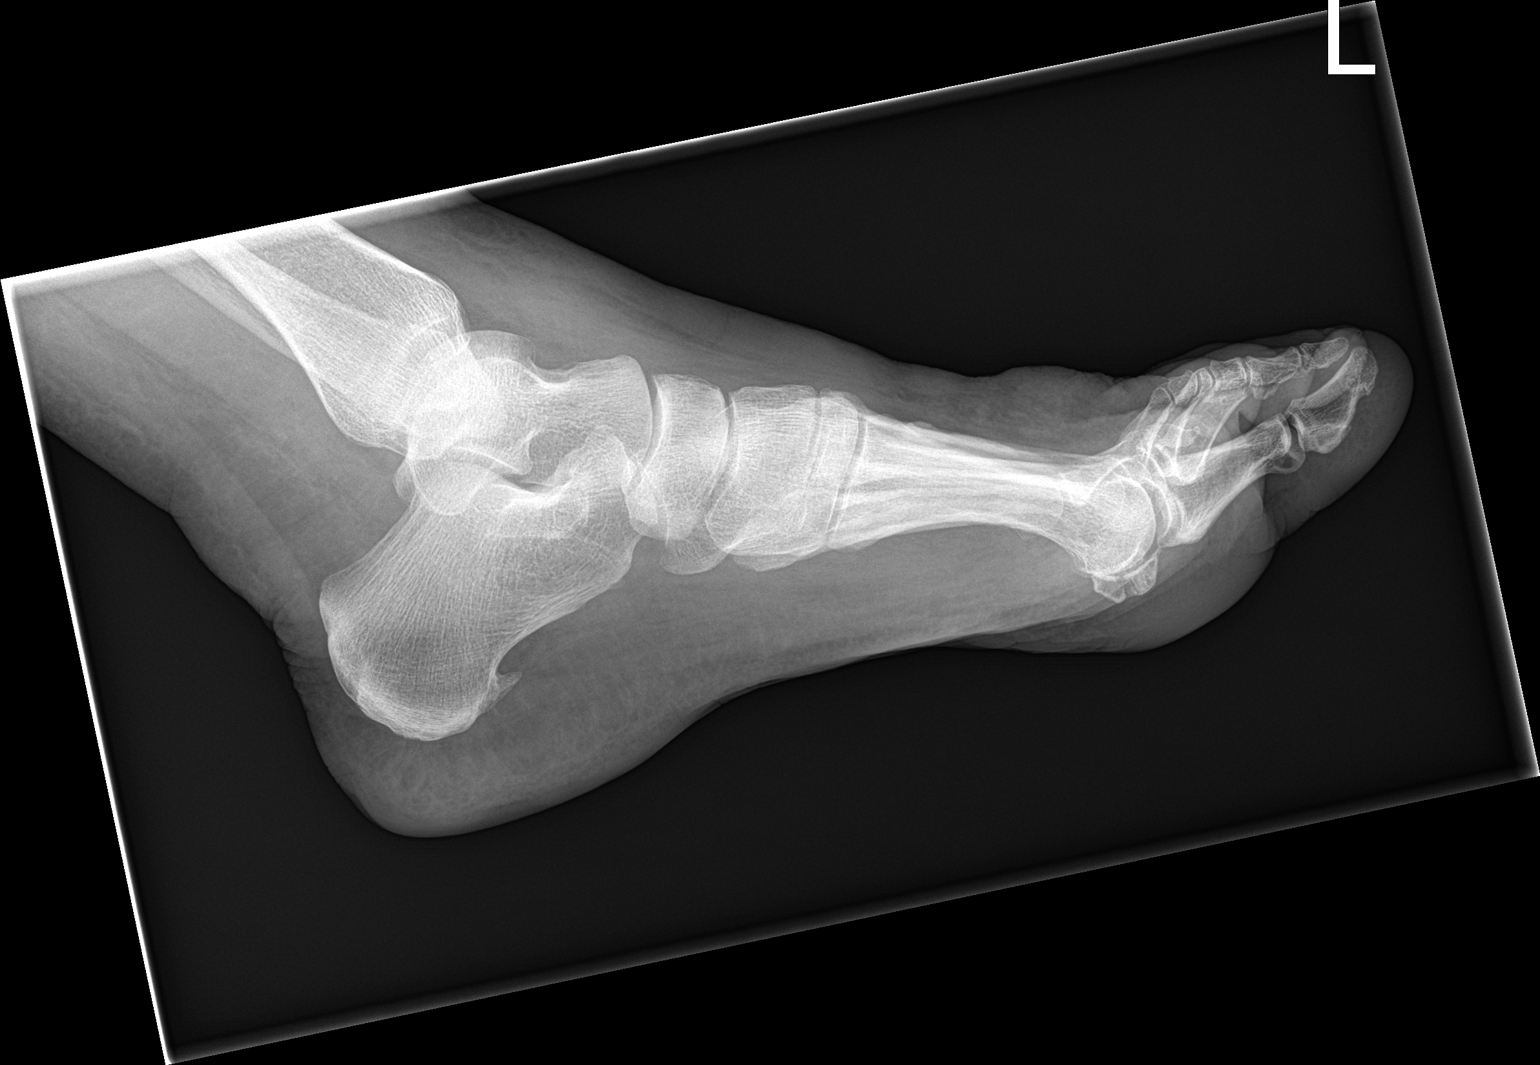

[3 of 3 positions shown; findings below may reference images not displayed]

FINDINGS: On the frontal view there is cortical erosion and focal
demineralization of the distal portion of the proximal phalanx of
the left great toe, very suspicious for active osteomyelitis with
adjacent soft tissue swelling. No other acute abnormality is seen.
Joint spaces appear normal for age.
IMPRESSION: There is cortical erosion and focal demineralization of the distal
portion of the proximal phalanx of the left great toe very
suspicious for osteomyelitis.

## 2018-05-01 ENCOUNTER — Telehealth (INDEPENDENT_AMBULATORY_CARE_PROVIDER_SITE_OTHER): Payer: Self-pay | Admitting: Orthopedic Surgery

## 2018-05-01 DIAGNOSIS — C50411 Malignant neoplasm of upper-outer quadrant of right female breast: Secondary | ICD-10-CM | POA: Diagnosis not present

## 2018-05-01 DIAGNOSIS — E1122 Type 2 diabetes mellitus with diabetic chronic kidney disease: Secondary | ICD-10-CM | POA: Diagnosis not present

## 2018-05-01 DIAGNOSIS — F0390 Unspecified dementia without behavioral disturbance: Secondary | ICD-10-CM | POA: Diagnosis not present

## 2018-05-01 DIAGNOSIS — I130 Hypertensive heart and chronic kidney disease with heart failure and stage 1 through stage 4 chronic kidney disease, or unspecified chronic kidney disease: Secondary | ICD-10-CM | POA: Diagnosis not present

## 2018-05-01 DIAGNOSIS — I5030 Unspecified diastolic (congestive) heart failure: Secondary | ICD-10-CM | POA: Diagnosis not present

## 2018-05-01 DIAGNOSIS — Z4789 Encounter for other orthopedic aftercare: Secondary | ICD-10-CM | POA: Diagnosis not present

## 2018-05-01 NOTE — Telephone Encounter (Signed)
Corey Harold with [facility] called stating that he was out at the patient's house and the patient reported to him that she has had several falls over the past week.  No injuries, just some swelling that fluid on the knee.  CB#[phone].  Thank you.

## 2018-05-02 ENCOUNTER — Other Ambulatory Visit: Payer: Self-pay

## 2018-05-02 NOTE — Telephone Encounter (Signed)
Pt has an appt for follow up on Thursday will hold message and address at office visit.

## 2018-05-02 NOTE — Patient Outreach (Signed)
Transition of care:  Placed call to patient who answered and identified herself. Patient reports that she is doing "terrible"  Reports 5 falls in the last week. Reports she does not know why. Reports that her therapist reported to MD.  Patient reports feeling weak. Reviewed symptoms and patient denies symptoms except weak legs.  Reports she does not think she has a UTI. Reports she finished her antibiotics.  States she was able to get herself into her wheel chair today for the first time in 1 week.    PLAN: encouraged patient to see primary MD. Liana Gerold to call and make her an appointment and she said she would herself.  Patient reports her doctor is in [place] and it is a long way.  Reports she has follow up planned with Dr. Sharol Given later this week and with the nephrologist next week.   Will update assigned case manager to follow up with patient in 1 week. Encouraged patient to see MD.  Tomasa Rand, RN, BSN, CEN [facility] [phone]

## 2018-05-04 ENCOUNTER — Ambulatory Visit (INDEPENDENT_AMBULATORY_CARE_PROVIDER_SITE_OTHER): Payer: Medicare Other

## 2018-05-04 ENCOUNTER — Ambulatory Visit (INDEPENDENT_AMBULATORY_CARE_PROVIDER_SITE_OTHER): Payer: Medicare Other | Admitting: Physician Assistant

## 2018-05-04 ENCOUNTER — Encounter (INDEPENDENT_AMBULATORY_CARE_PROVIDER_SITE_OTHER): Payer: Self-pay | Admitting: Physician Assistant

## 2018-05-04 VITALS — Ht 66.0 in | Wt 182.0 lb

## 2018-05-04 DIAGNOSIS — M25562 Pain in left knee: Secondary | ICD-10-CM

## 2018-05-04 DIAGNOSIS — M1A0720 Idiopathic chronic gout, left ankle and foot, without tophus (tophi): Secondary | ICD-10-CM

## 2018-05-04 DIAGNOSIS — M25561 Pain in right knee: Secondary | ICD-10-CM

## 2018-05-04 NOTE — Progress Notes (Signed)
Office Visit Note   Patient: [name]           Date of Birth: 1940-12-19           MRN: [id number] Visit Date: 05/04/2018              Requested by: [address], [place],  [id number] PCP: [place] [facility], MD  Chief Complaint  Patient presents with  . Right Ankle - Routine Post Op    02/22/18 right subtalar and talonavicular fusion   . Right Knee - Pain  . Left Knee - Pain      HPI: The patient is a 78 year old woman here with her husband for postoperative follow-up following a right subtalar and talonavicular fusion on 02/22/2018.  She comes in today reporting that over the past several weeks she has had several falls without injury.  On further questioning of the patient she reports that she feels like she just gets weak after she stands and walks for a little bit with her walker and she has lowered herself down to her knees and then crawled around on the floor and has had to call family members to come and get her up again.  She has been working with home health physical therapy but reports that they may be leaving and discharging her soon.  She reports that she is having trouble doing basic activities of daily living.  She does report that she has been on Aromasin for her history of breast cancer and that her oncologist told her that she might not be able to tolerate this medication well and she questions if this might be the source of some of her fatigue.  She is going to go back and try to see the oncologist before her usual scheduled annual appointment.  She also reports she is not sleeping well, but then her husband comments that she is also napping some during the daytime.  She has been on allopurinol 100 mg twice daily for her gout and her last uric acid was 9.0 in November and we have redrawn another uric acid level today to see if this is contributing to some of her symptoms in her legs but she generally says they are just weak.  She has no back  pain.  She has no new bowel or bladder symptoms.  Assessment & Plan: Visit Diagnoses:  1. Acute pain of right knee   2. Acute pain of left knee   3. Idiopathic chronic gout of left foot without tophus     Plan: Would recommend the patient continue to work with physical therapy for range of motion strengthening progression with her gait for several more weeks.  Also recommend a home health aide for activities of daily living and [facility] for this.  Have also recommended the patient follow-up with oncology sooner rather than later to discuss her Aromasin therapy.  I am not sure if this is contributing to her symptoms or not.  She does have more edema today and we discussed trying to elevate more and to wear better compression stockings.  She is in some compression stockings today but they do not appear compressive enough.  Have recommended she follow back up here in 4 weeks with Dr. Sharol Given.  Follow-Up Instructions: Return in about 4 weeks (around 06/01/2018) for Dr. Sharol Given.   Ortho Exam  Patient is alert, oriented, no adenopathy, well-dressed, normal affect, normal respiratory effort. Examination of bilateral  knees shows good range of motion.  There are no contusions or abrasions over the knees.  She has good strength throughout both lower extremities.  She has good EHL strength.  Good knee flexion extension good hip flexors abduction and good adduction strength. Her right ankle/foot incisions are well healed and without signs of infection or cellulitis. There is bilateral lower leg edema despite some compression and we discussed going back into her silver compression socks. She has good pedal pulses.   Imaging: Xr Knee 1-2 Views Left  Result Date: 05/04/2018 S/P left total knee arthroplasty with good alignment and no signs of lucency about the components.   Xr Knee 1-2 Views Right  Result Date: 05/04/2018 Right knee with moderate arthritic changes especially  medially with joint space narrowing and subchondral sclerosis. No fractures.   No images are attached to the encounter.  Labs: Lab Results  Component Value Date   HGBA1C 7.1 (H) 02/24/2018   HGBA1C 7.1 (H) 02/22/2018   HGBA1C 5.7 (H) 12/07/2016   LABURIC 9.0 (H) 02/25/2018   LABURIC 10.2 (H) 02/16/2018   LABURIC 9.1 (H) 01/19/2018   REPTSTATUS 02/26/2018 FINAL 02/25/2018   CULT  02/25/2018    NO GROWTH Performed at [facility], [place] [address] [place], [place] [address]    LABORGA ESCHERICHIA COLI 12/31/2009     Lab Results  Component Value Date   ALBUMIN 2.3 (L) 03/01/2018   ALBUMIN 2.3 (L) 02/27/2018   ALBUMIN 2.7 (L) 02/25/2018   LABURIC 9.0 (H) 02/25/2018   LABURIC 10.2 (H) 02/16/2018   LABURIC 9.1 (H) 01/19/2018    Body mass index is 29.38 kg/m.  Orders:  Orders Placed This Encounter  Procedures  . XR Knee 1-2 Views Right  . XR Knee 1-2 Views Left  . Uric acid   No orders of the defined types were placed in this encounter.    Procedures: No procedures performed  Clinical Data: No additional findings.  ROS:  All other systems negative, except as noted in the HPI. Review of Systems  Objective: Vital Signs: Ht '5\' 6"'$  (1.676 m)   Wt 182 lb (82.6 kg)   BMI 29.38 kg/m   Specialty Comments:  No specialty comments available.  PMFS History: Patient Active Problem List   Diagnosis Date Noted  . Somnolence, daytime 04/06/2018  . Acute on chronic diastolic CHF (congestive heart failure) ([place]) 02/28/2018  . SOB (shortness of breath)   . Hypotension   . Elevated troponin 02/25/2018  . Hypomagnesemia 02/25/2018  . Hypokalemia 02/24/2018  . Urinary retention 02/24/2018  . Sepsis ([place]) 02/24/2018  . Posterior tibial tendinitis of right leg 02/22/2018  . Posterior tibial tendinitis, right leg   . Right atrial mass   . Chronic diastolic heart failure ([place]) 08/01/2017  . Genetic testing 12/03/2016  . 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  Malignant neoplasm of upper-outer quadrant of left breast in female, estrogen receptor positive ([place]) 11/19/2016  . Chronic idiopathic gout involving toe of left foot without tophus 11/15/2016  . Malignant 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, estrogen receptor positive ([place]) 11/09/2016  . Tendon tear, ankle, left, sequela   . Traumatic rupture of left anterior tibial tendon 04/20/2016  . Acute kidney injury superimposed on CKD ([place])   . 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 Headache, migraine   . Unexplained weight loss 11/17/2015  . 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 Restless  legs syndrome 07/24/2015  . Mild cognitive impairment 01/08/2015  . Abnormality of gait 01/08/2015  . Dizziness 12/19/2014  . Preoperative cardiovascular examination 12/11/2013  . GERD (gastroesophageal reflux disease) 10/06/2013  . Syncope 10/05/2013  . Anxiety 10/28/2012    Class: Acute  . Bilateral lower extremity edema 10/28/2012  . CAD S/P percutaneous coronary angioplasty   . Essential hypertension   . Dyslipidemia, goal LDL below 70    Past Medical History:  Diagnosis Date  . Anemia    have received iron infusions  . Anxiety   . Arthritis     osteoarthritis  . Breast cancer ([place])   . Breast cancer in female [facility])    Bilateral  . CAD S/P percutaneous coronary angioplasty 2007; March 2011   [employer] 3.0 mm 20 mm postdilated 3.6 mm in early mid LAD; status post ISR Cutting Balloon PTCA and March 11 along with PCI of distal mid lesion with a 3.0 mm 12 mm MultiLink vision BMS; the proximal stent causes jailing of SP1 and SP2 with ostial 70-80% lesions  . Cancer (HCC)    Melanoma, Squamous cell Carcinoma  . CHF (congestive heart failure) ([place])   . Chronic kidney disease    stage 2   . 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 Dementia ([place])    mild  . Diabetes mellitus type  2 with neurological manifestations ([place])   . Dyslipidemia, goal LDL below 70   . Full dentures   . Genetic testing 12/03/2016   Germline genetic testing was performed through [facility] Common Hereditary Cancers Panel + [facility]'s Melanoma Panel. This custom panel includes analysis of the following 51 genes: APC, ATM, AXIN2, BAP1, BARD1, BMPR1A, BRCA1, BRCA2, BRIP1, CDH1, CDK4, CDKN2A, CHEK2, CTNNA1, DICER1, EPCAM, GREM1, HOXB13, KIT, MEN1, MITF, MLH1, MSH2, MSH3, MSH6, MUTYH, NBN, NF1, NTHL1, PALB2, PDGFRA, PMS2, POLD1, POL  . GERD (gastroesophageal reflux disease)   . Headache(784.0)   . History of hematuria    Followed by Dr. Gaynelle Arabian  . Hypertension, essential, benign   . Nausea & vomiting 10/2015  . Peripheral neuropathy   . Stroke ([place])   . TIA (transient ischemic attack)    multiple in the past  . Wears glasses     Family History  Problem Relation Age of Onset  . Diabetes Mother   . Hypertension Mother   . Kidney disease Mother   . Hypertension Father   . Emphysema Father   . Heart attack Father   . Heart disease Father   . Arthritis Sister   . Diabetes Sister   . Hypertension Sister   . Breast cancer Sister 8       treated with mastectomy  . Cervical cancer Sister 49  . Hypertension Brother   . Hyperlipidemia Brother   . Diabetes Brother   . Stroke Brother   . Diabetes Sister   . Hypertension Sister   . Stomach cancer Sister 79  . Hypertension Brother   . ALS Brother        d.62s  . Breast cancer Daughter 12       triple negative treated with lumpectomy, chemo, radiation  . Heart attack Daughter 10       d.45  . COPD Daughter   . Cancer Paternal Aunt        unspecified type  . Cancer Paternal Uncle        unspecified type  . Cancer Paternal Uncle  unspecified type    Past Surgical History:  Procedure Laterality Date  . ABDOMINAL HYSTERECTOMY    . ANKLE FUSION Right 02/22/2018   Procedure: RIGHT SUBTALAR AND TALONAVICULAR FUSION;  Surgeon: Newt Minion, MD;  Location: [facility];  Service: Orthopedics;  Laterality: Right;  . APPENDECTOMY    . BLADDER SUSPENSION    . BREAST LUMPECTOMY Bilateral 2018  . BREAST LUMPECTOMY WITH RADIOACTIVE SEED AND SENTINEL LYMPH NODE BIOPSY Bilateral 12/14/2016   Procedure: BILATERAL BREAST LUMPECTOMY WITH BILATERAL  RADIOACTIVE SEED AND BILATERAL AXILLARY  SENTINEL LYMPH NODE BIOPSY ERAS PATHWAY;  Surgeon: Alphonsa Overall, MD;  Location: [facility];  Service: General;  Laterality: Bilateral;  PECTORAL BLOCK  . CARDIAC CATHETERIZATION  02/24/2006   85% stenosis in the proximal portion of LAD-arrangements made for PCI on 02/25/2006  . CARDIAC CATHETERIZATION  02/25/2006   80% LAD lesion stented with a 3x28m Liberte stent resulting in reduction of 80% lesion to 0% residual  . CARDIAC CATHETERIZATION  04/26/2006   Medical management  . CARDIAC CATHETERIZATION  06/24/2009   60-70% re-stenosis in the proximal LAD. A 3.25x15 cutting balloon, 3 inflations - 14atm-38sec, 13atm-39sec, and 12atm-40sec reduced to less than 10%. 60% stenosis of the mid/distal LAD stented with a 3x144mMultilink stent.  . Marland KitchenARDIAC CATHETERIZATION  07/09/2009   Medical management  . CAROTID DOPPLER  06/24/2009   40-59% R ICA stenosis. No significant ICA stenosis noted  . CHOLECYSTECTOMY    . DILATION AND CURETTAGE OF UTERUS    . JOINT REPLACEMENT Left   . LESION REMOVAL Right 03/11/2015   Procedure:  EXCISION OF RIGHT PRE TIBIAL LESION;  Surgeon: JaJudeth HornMD;  [facility] Service: General;  Laterality: Right;  . MASS EXCISION Left 06/10/2014   Procedure: EXCISION LEFT LOWER LEG LESION;  Surgeon: JaDoreen SalvageMD;  [place] [facility] Service: General;  Laterality: Left;  . Marland KitchenASS EXCISION Left 09/05/2015   Procedure: EXCISION OF LEFT FOREARM  MASS;  Surgeon: JaJudeth HornMD;  [facility] Service: General;  Laterality: Left;  . NM MYOVIEW LTD  11/16/2011   5 beats a PVC during  recovery.  No skin or infarction.  Reached 5 METs, EKG negative for ischemia   . NM MYOVIEW LTD  10/2011   No ischemia or infarction.  Normal EF.  LOW RISK. (Short bursts of 5 beats NSVT during recovery otherwise normal)  . SHOULDER ARTHROSCOPY  10/15  . SHOULDER ARTHROSCOPY  1995   left  . TEE WITHOUT CARDIOVERSION N/A 08/03/2017   Procedure: TRANSESOPHAGEAL ECHOCARDIOGRAM (TEE);  Surgeon: RaSkeet LatchMD;  [place] Service: Cardiovascular;; EF 60-65% with no wall motion normalities.  No atrial thrombus noted.  Lightly findings consistent with a lipomatous hypertrophy of the atrial septum.   . TENDON REPAIR Left 05/12/2016   Procedure: Left Anterior Tibial Tendon Reconstruction;  Surgeon: MaNewt MinionMD;  Location: [facility] Service: Orthopedics;  Laterality: Left;  . TOTAL KNEE ARTHROPLASTY  2005   left  . TRANSESOPHAGEAL ECHOCARDIOGRAM  08/03/2016   : EF 60-65% no wall motion normalities.  No atrial thrombus.  Lipomatous hypertrophy of the atrial septum.  No mass noted.  . TRANSTHORACIC ECHOCARDIOGRAM  10/2015   EF 55-60 %. Mild LVH. Mild pulmonary hypertension. Normal valves.  . TRANSTHORACIC ECHOCARDIOGRAM  07/2017   Vigorous LV function.  EF 65-70%.  Mild diastolic dysfunction with no RWMA. GR 1 DD.  Mild aortic  sclerosis/no stenosis.  Questionable right atrial mass discounted with TEE)   . WRIST ARTHROPLASTY  2010   cancer lt wrist   Social History   Occupational History  . Occupation: retired    Fish farm manager: RETIRED  Tobacco Use  . Smoking status: Never Smoker  . Smokeless tobacco: Never Used  Substance and Sexual Activity  . Alcohol use: No  . Drug use: No  . Sexual activity: Never

## 2018-05-04 NOTE — Telephone Encounter (Signed)
This was addressed while in office today. Will extend physical therapy for this pt and try to add a home health aid several times a week.

## 2018-05-05 ENCOUNTER — Telehealth (INDEPENDENT_AMBULATORY_CARE_PROVIDER_SITE_OTHER): Payer: Self-pay

## 2018-05-05 LAB — URIC ACID: Uric Acid, Serum: 7.3 mg/dL — ABNORMAL HIGH (ref 2.5–7.0)

## 2018-05-05 NOTE — Telephone Encounter (Signed)
I called patient and discuss her results with her and she understood.

## 2018-05-06 IMAGING — MR MR FOOT*L* W/O CM
4 of 5 series · 27 of 40 positions shown · non-contrast
Comparison: Left foot x-rays dated November 04, 2016. Left foot MRI
dated February 23, 2016.

CLINICAL DATA: Left foot infection.  Evaluate for osteomyelitis.

EXAM:
MRI OF THE LEFT FOOT WITHOUT CONTRAST
TECHNIQUE: Multiplanar, multisequence MR imaging of the left foot was
performed. No intravenous contrast was administered.

[Series 3: T1 · coronal · 4.0mm · 0.47mm/px · 8 of 27 slices shown (1 of 2)]
[im 1/27]
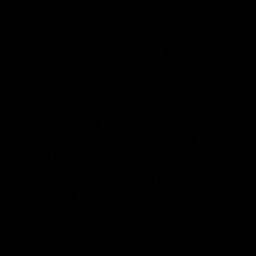
[im 4/27]
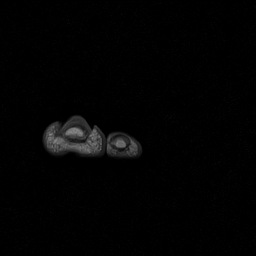
[im 8/27]
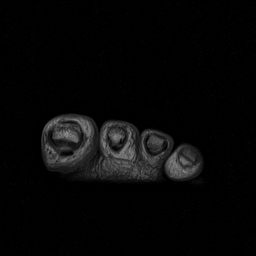
[im 12/27]
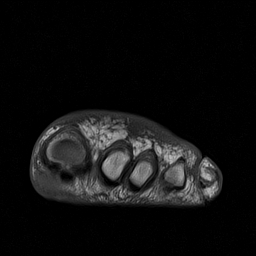
[im 15/27]
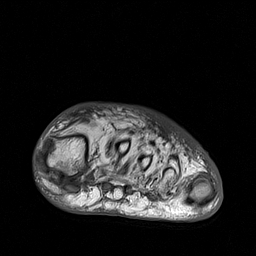
[im 19/27]
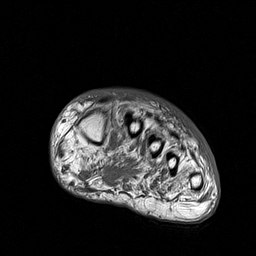
[im 23/27]
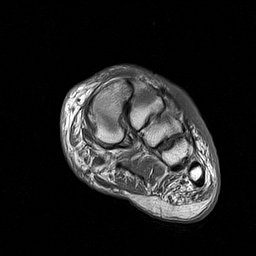
[im 27/27]
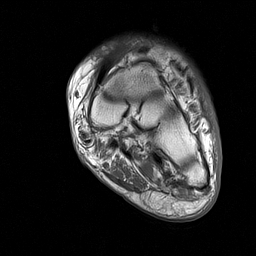

[Series 4: T2 fat-sat · coronal · 4.0mm · 0.23mm/px · 8 of 27 slices shown]
[im 1/27]
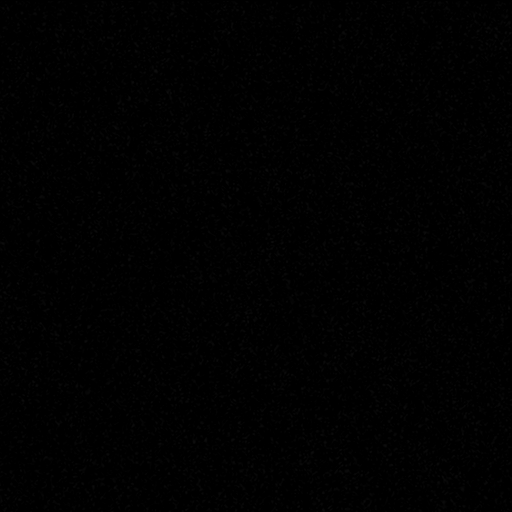
[im 4/27]
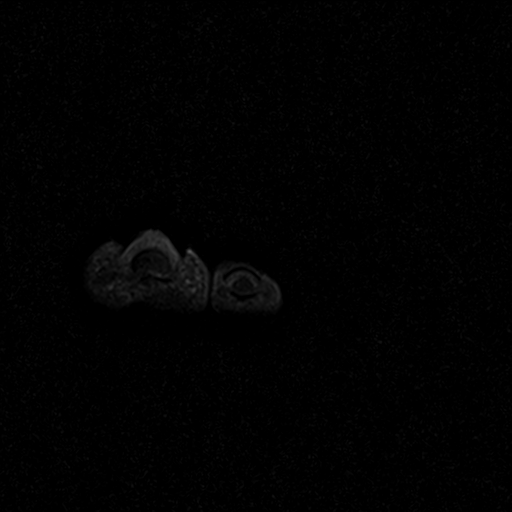
[im 8/27]
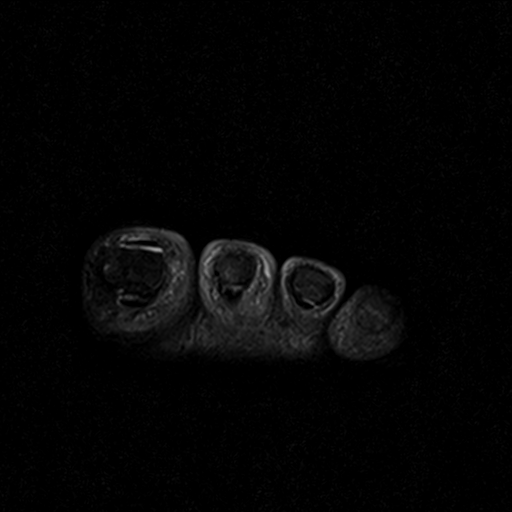
[im 12/27]
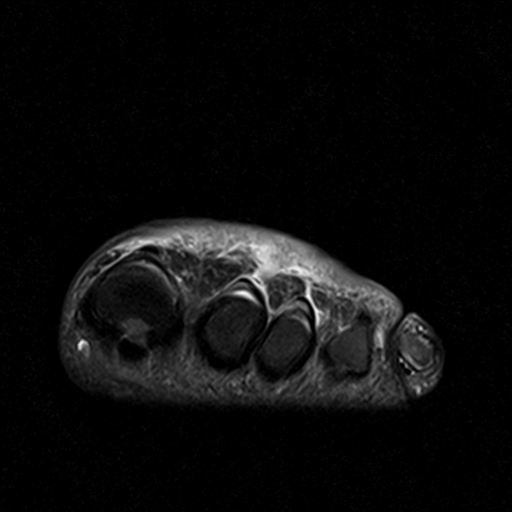
[im 15/27]
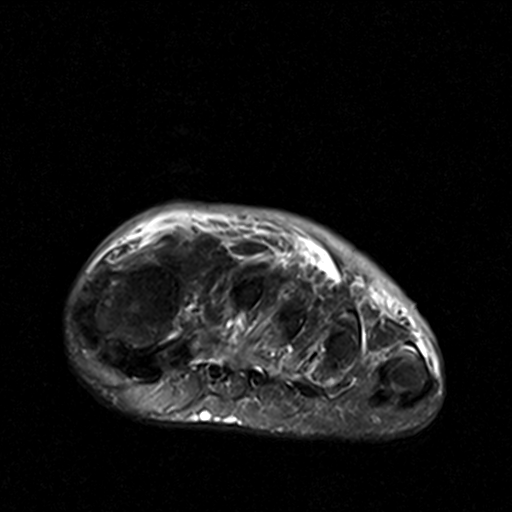
[im 19/27]
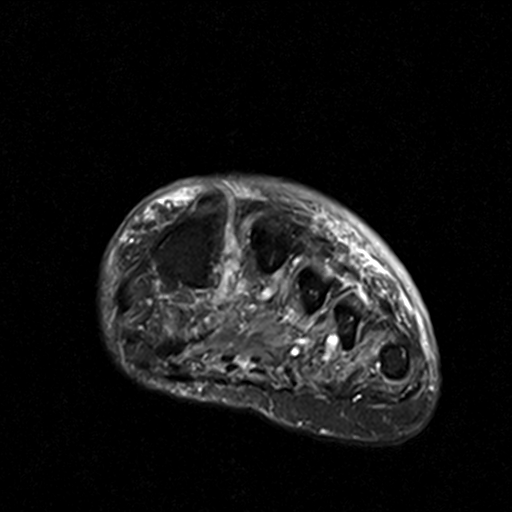
[im 23/27]
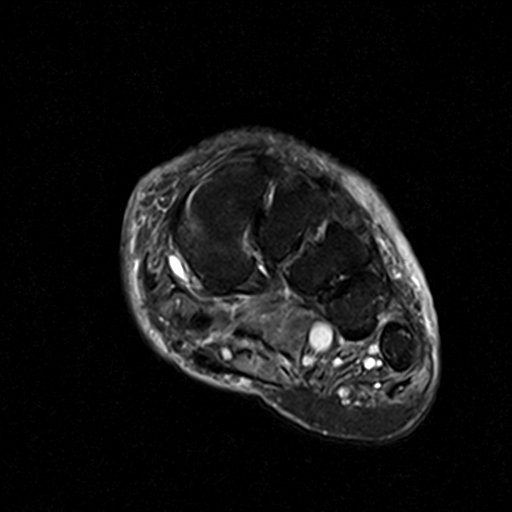
[im 27/27]
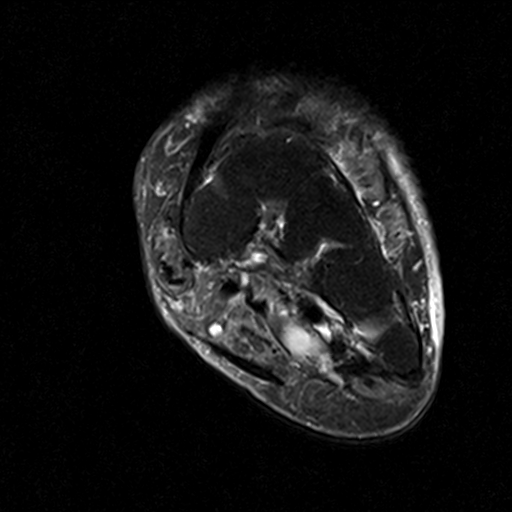

[Series 5: T1 · axial · 2.5mm · 0.35mm/px · z∈[-76,-16]mm · 8 of 28 slices shown (2 of 2)]
[im 1/28]
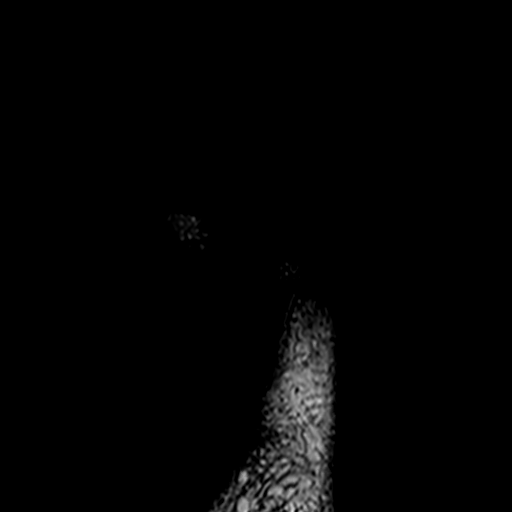
[im 4/28]
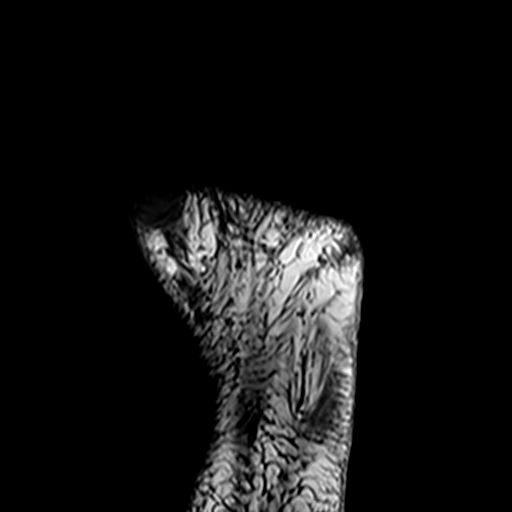
[im 8/28]
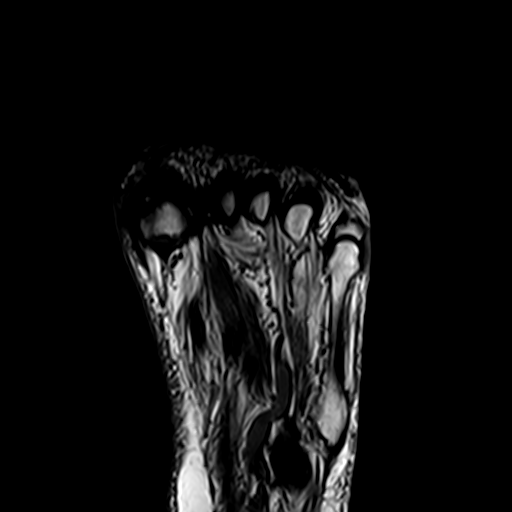
[im 12/28]
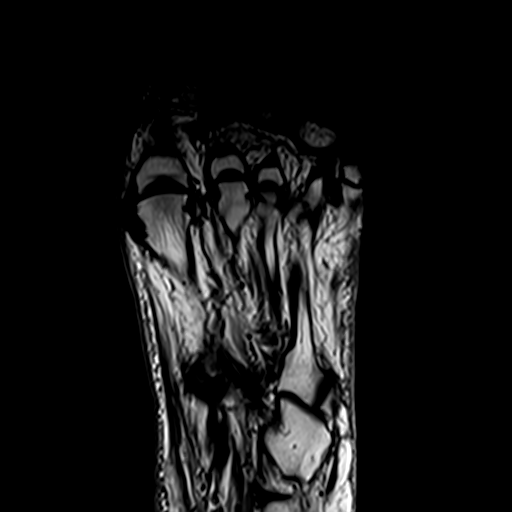
[im 16/28]
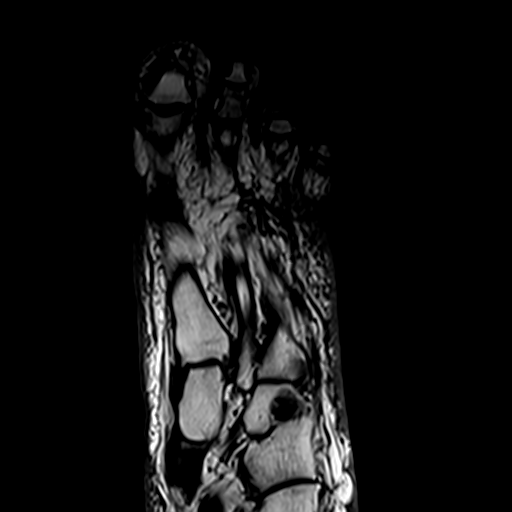
[im 20/28]
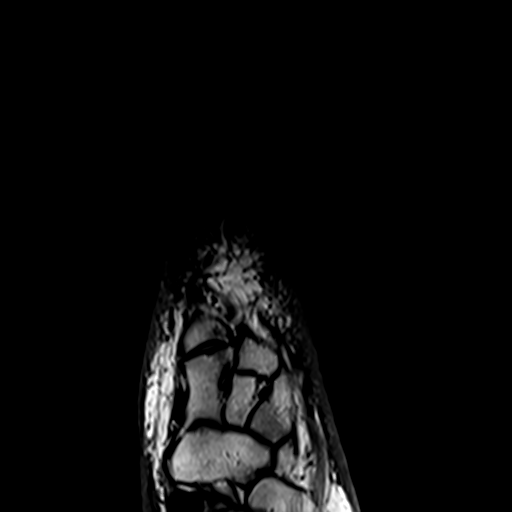
[im 24/28]
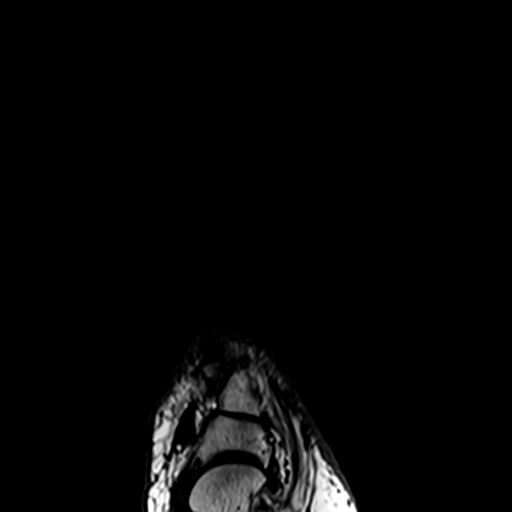
[im 28/28]
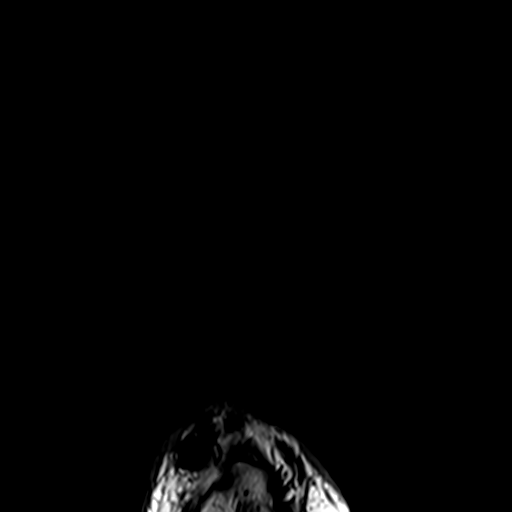

[Series 6: STIR · axial · 2.2mm · 0.59mm/px · z∈[-63,-22]mm · 3 of 28 slices shown]
[im 4/28]
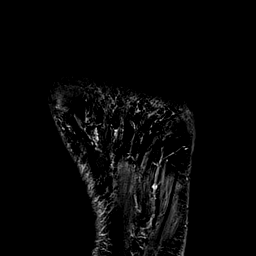
[im 16/28]
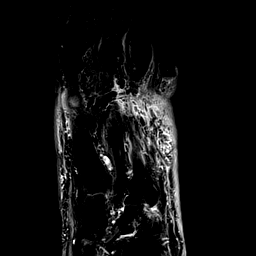
[im 24/28]
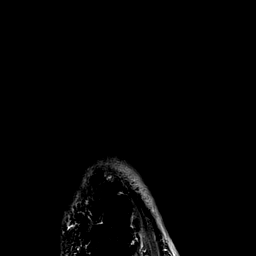

[27 of 40 positions shown; findings below may reference images not displayed]

FINDINGS: Bones/Joint/Cartilage

There is a juxta-articular erosion along the medial aspect of the
first proximal phalanx head without significant surrounding edema.
No focal marrow replacing lesion. No suspicious marrow signal
abnormality.

Mild degenerative changes at the first MTP joint with associated
small effusion. Mild midfoot degenerative changes.

Ligaments

The Lisfranc ligament is intact.

Muscles and Tendons

The visualized flexor and extensor tendons are intact.

Soft tissues

Mild dorsal forefoot soft tissue swelling. No drainable fluid
collection.
IMPRESSION: 1. No evidence of osteomyelitis.
2. Small juxta-articular erosion at the medial aspect of the first
proximal phalanx head, as seen on prior plain film, is nonspecific,
but could be seen in gout. This was present on prior MRI from
January 2016. Mild dorsal forefoot soft tissue swelling, as can be seen in
cellulitis. No drainable fluid collection.

## 2018-05-07 IMAGING — MR MR BREAST BIOPSY
10 of 13 series · 33 of 48 positions shown · IV contrast (8ml Multihance)
Comparison: Previous exams.

ADDENDUM:
Pathology revealed INVASIVE MAMMARY CARCINOMA, ATYPICAL LOBULAR
HYPERPLASIA, FIBROCYSTIC CHANGES AND PSEUDOANGIOMATOUS STROMAL
HYPERPLASIA (PASH) of the Left breast, upper outer. This was found
to be concordant by Dr. Jeeanah Tedim. Pathology results were
discussed with the patient by telephone. The patient reported doing
well after the biopsy with tenderness at the site. Post biopsy
instructions and care were reviewed and questions were answered. The
patient was encouraged to call [REDACTED] for any additional concerns. The patient has a recent
diagnosis of right breast cancer and should follow her outlined
treatment plan. Pathology results were communicated to Dr. Eke
Atici via [REDACTED] message on November 12, 2016.

Pathology results reported by Artis Noland, RN on 11/12/2016.
CLINICAL DATA: 76-year-old female with recently diagnosed right
breast cancer presents for MRI guided biopsy of an enhancing mass in
the upper-outer left breast seen on recent MRI dated 11/01/2016.
EXAM:
MRI GUIDED CORE NEEDLE BIOPSY OF THE LEFT BREAST
TECHNIQUE: Multiplanar, multisequence MR imaging of the left breast was
performed both before and after administration of intravenous
contrast.
CONTRAST:  8mL MULTIHANCE GADOBENATE DIMEGLUMINE 529 MG/ML IV SOLN

[Series 3: axial pre-cm · axial · non-contrast · 1.3mm · 0.73mm/px · z∈[-74,+112]mm · 3 of 144 slices shown]
[im 1/144]
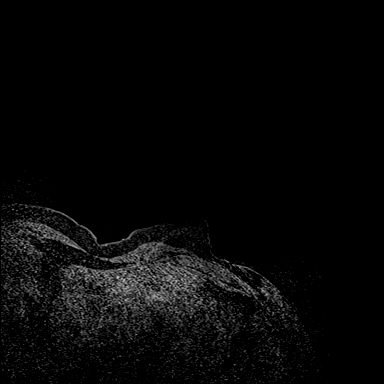
[im 72/144]
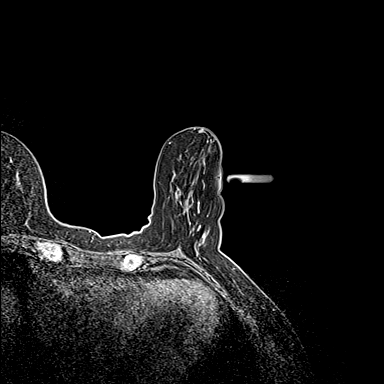
[im 144/144]
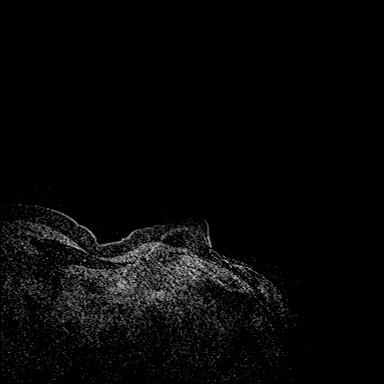

[Series 4: axial pre-cm_s3_(id) · axial · non-contrast · 1.3mm · 0.73mm/px · z∈[-74,+112]mm · 3 of 144 slices shown]
[im 1/144]
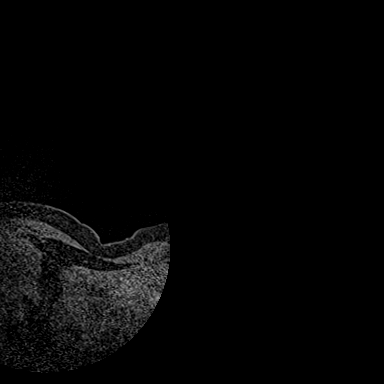
[im 72/144]
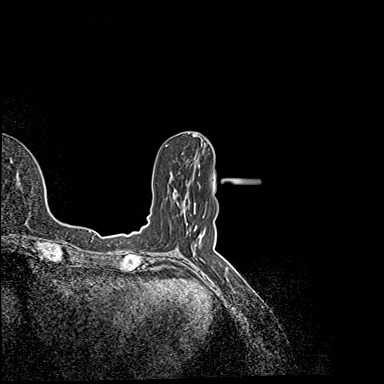
[im 144/144]
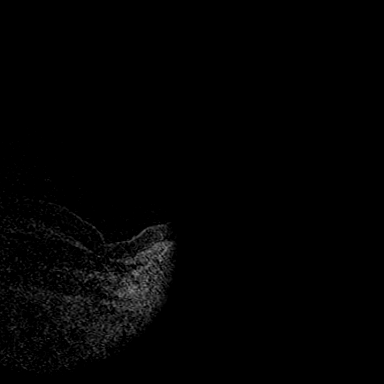

[Series 5: axial post 20 · axial · 1.3mm · 0.73mm/px · z∈[-74,+112]mm · 3 of 144 slices shown (1 of 3)]
[im 1/144]
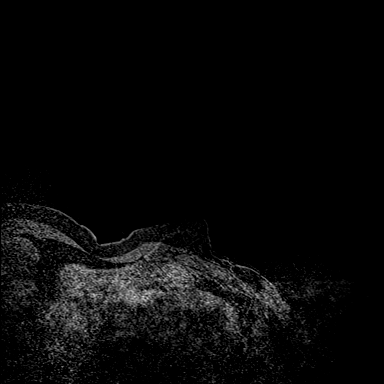
[im 72/144]
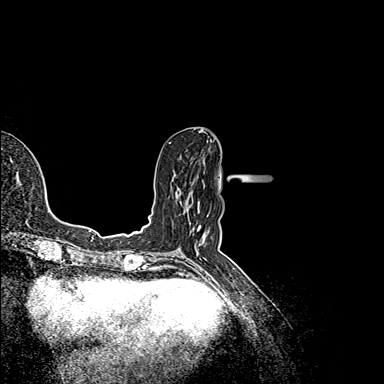
[im 144/144]
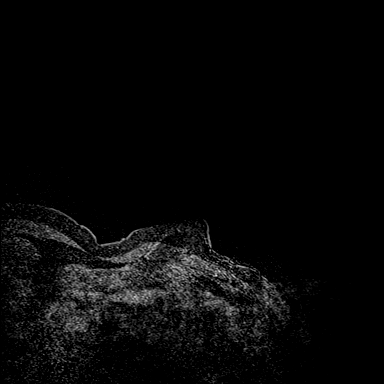

[Series 6: axial post 20 · axial · 1.3mm · 0.73mm/px · z∈[-74,+112]mm · 3 of 144 slices shown (2 of 3)]
[im 1/144]
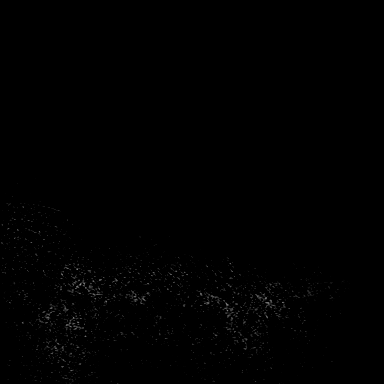
[im 72/144]
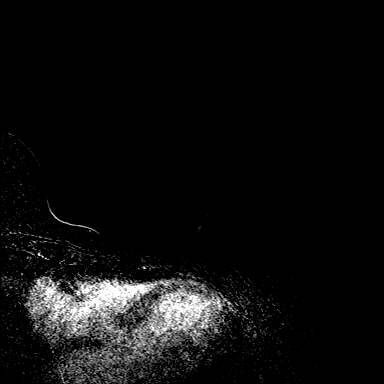
[im 144/144]
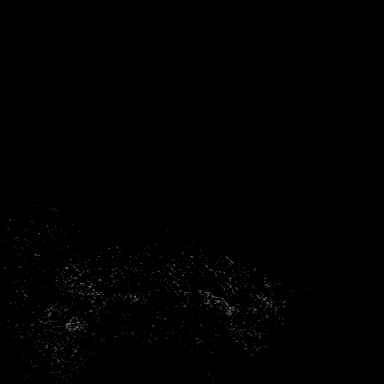

[Series 7: axial post 20 · axial · 1.3mm · 0.73mm/px · z∈[-74,+112]mm · 4 of 144 slices shown (3 of 3)]
[im 1/144]
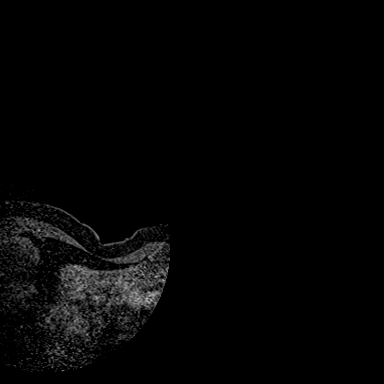
[im 48/144]
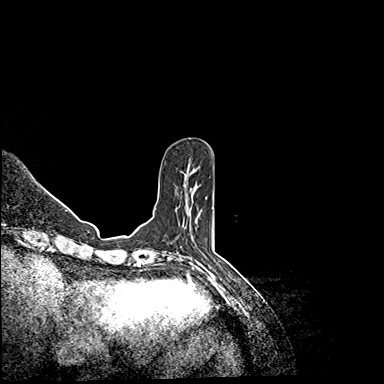
[im 96/144]
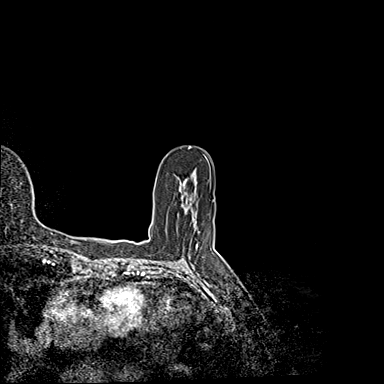
[im 144/144]
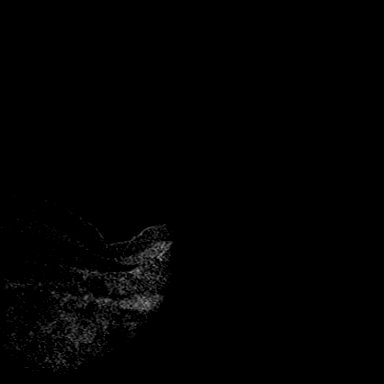

[Series 8: axial post 3 · axial · 1.3mm · 0.73mm/px · z∈[-74,+112]mm · 4 of 144 slices shown (1 of 3)]
[im 1/144]
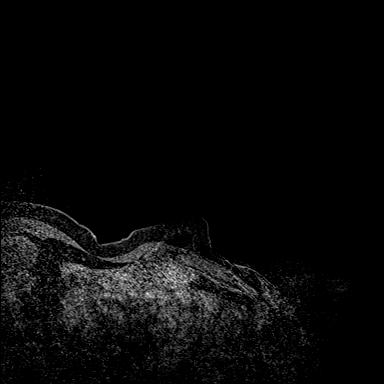
[im 48/144]
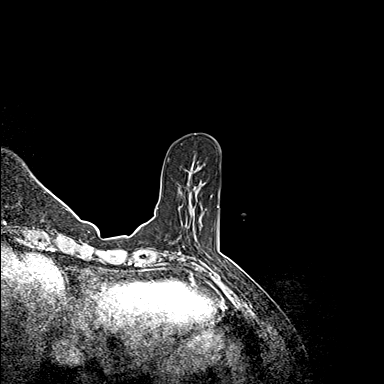
[im 96/144]
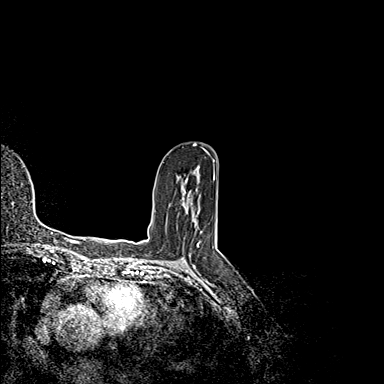
[im 144/144]
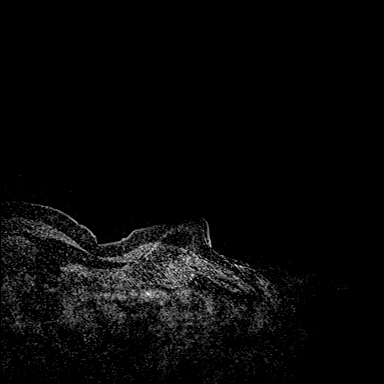

[Series 9: axial post 3 · axial · 1.3mm · 0.73mm/px · z∈[-74,+112]mm · 4 of 144 slices shown (2 of 3)]
[im 1/144]
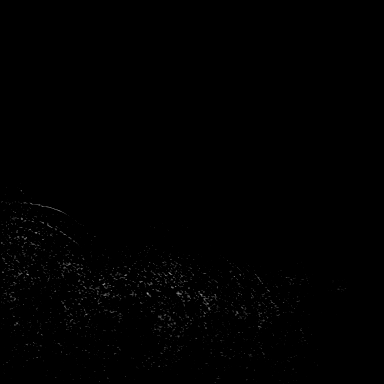
[im 48/144]
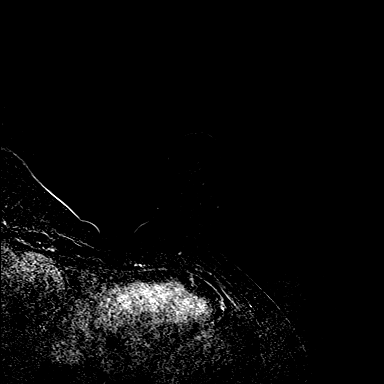
[im 96/144]
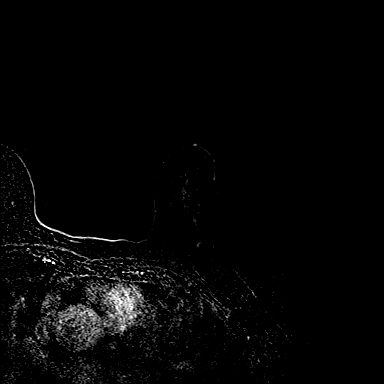
[im 144/144]
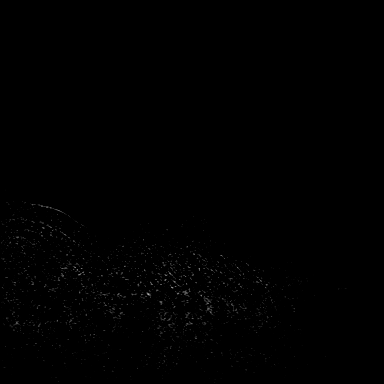

[Series 10: axial post 3 · axial · 1.3mm · 0.73mm/px · z∈[-74,+112]mm · 4 of 144 slices shown (3 of 3)]
[im 1/144]
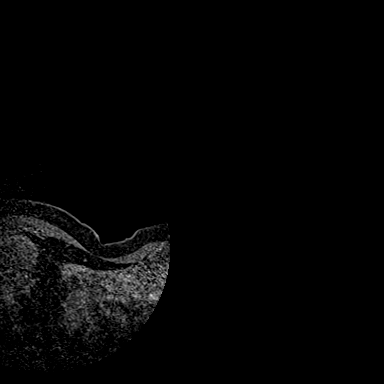
[im 48/144]
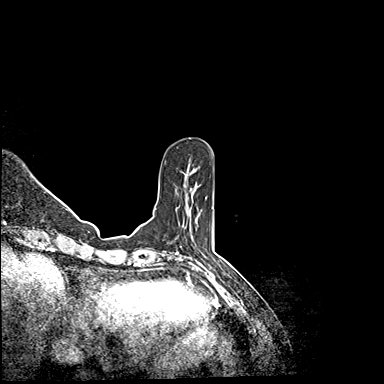
[im 96/144]
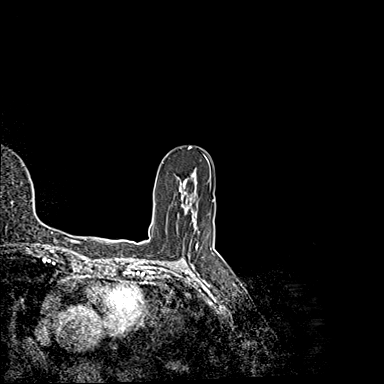
[im 144/144]
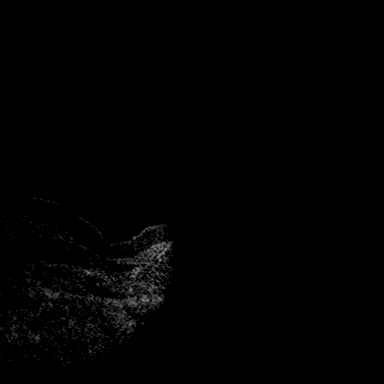

[Series 11: axial confirmation · axial · 1.3mm · 0.73mm/px · z∈[-74,+112]mm · 4 of 144 slices shown]
[im 1/144]
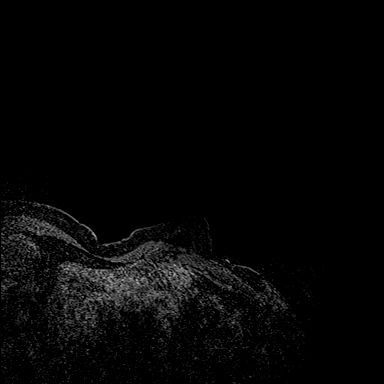
[im 48/144]
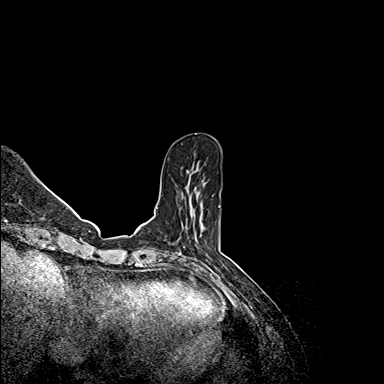
[im 96/144]
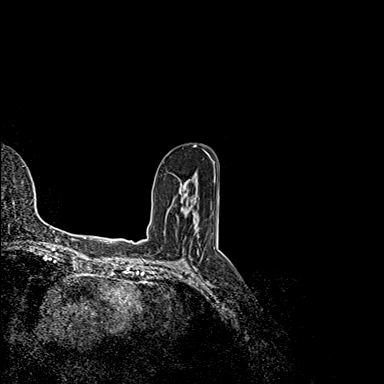
[im 144/144]
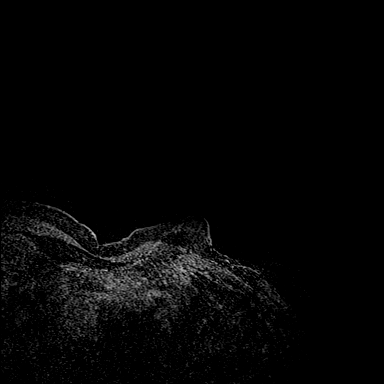

[Series 12: axial confirmation_sub · axial · 1.3mm · 0.73mm/px · 1 of 144 slices shown]
[im 1/144]
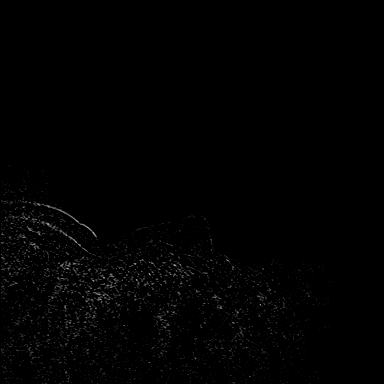

[33 of 48 positions shown; findings below may reference images not displayed]

FINDINGS: I met with the patient, and we discussed the procedure of MRI guided
biopsy, including risks, benefits, and alternatives. Specifically,
we discussed the risks of infection, bleeding, tissue injury, clip
migration, and inadequate sampling. Informed, written consent was
given. The usual time out protocol was performed immediately prior
to the procedure.

Using sterile technique, 1% Lidocaine, MRI guidance, and a 9 gauge
vacuum assisted device, biopsy was performed of the enhancing mass
in the upper-outer left breast using a lateral to medial approach.
At the conclusion of the procedure, a dumbbell shaped tissue marker
clip was deployed into the biopsy cavity. Follow-up 2-view mammogram
was performed and dictated separately.
IMPRESSION: MRI guided biopsy of the enhancing mass in the upper-outer left
breast. No apparent complications.

## 2018-05-08 ENCOUNTER — Telehealth (INDEPENDENT_AMBULATORY_CARE_PROVIDER_SITE_OTHER): Payer: Self-pay | Admitting: Orthopedic Surgery

## 2018-05-08 DIAGNOSIS — C50411 Malignant neoplasm of upper-outer quadrant of right female breast: Secondary | ICD-10-CM | POA: Diagnosis not present

## 2018-05-08 DIAGNOSIS — I130 Hypertensive heart and chronic kidney disease with heart failure and stage 1 through stage 4 chronic kidney disease, or unspecified chronic kidney disease: Secondary | ICD-10-CM | POA: Diagnosis not present

## 2018-05-08 DIAGNOSIS — I5030 Unspecified diastolic (congestive) heart failure: Secondary | ICD-10-CM | POA: Diagnosis not present

## 2018-05-08 DIAGNOSIS — F0390 Unspecified dementia without behavioral disturbance: Secondary | ICD-10-CM | POA: Diagnosis not present

## 2018-05-08 DIAGNOSIS — Z4789 Encounter for other orthopedic aftercare: Secondary | ICD-10-CM | POA: Diagnosis not present

## 2018-05-08 DIAGNOSIS — E1122 Type 2 diabetes mellitus with diabetic chronic kidney disease: Secondary | ICD-10-CM | POA: Diagnosis not present

## 2018-05-08 NOTE — Telephone Encounter (Signed)
[facility]  [phone]  [place] left on the [place] called from [facility] wanted to inform Dr.Duda that the patient fell yesterday moving from the recliner to the wheelchair. [name] is concerned that the patient is unable to extend and experiencing locking and wobbling of the knees.

## 2018-05-08 NOTE — Telephone Encounter (Signed)
Called and lm on vm to advise that pt should follow up with Dr. Sharol Given this week Wednesday or Thursday.

## 2018-05-08 NOTE — Telephone Encounter (Signed)
Next appt is not until 06/01/18 do you want me to have her come in this week for eval? Not sure what to offer.

## 2018-05-08 NOTE — Telephone Encounter (Signed)
Could offer follow up on Wednesday or Thursday when Dr. Sharol Given here

## 2018-05-09 ENCOUNTER — Other Ambulatory Visit: Payer: Self-pay | Admitting: *Deleted

## 2018-05-09 DIAGNOSIS — C50411 Malignant neoplasm of upper-outer quadrant of right female breast: Secondary | ICD-10-CM | POA: Diagnosis not present

## 2018-05-09 DIAGNOSIS — Z4789 Encounter for other orthopedic aftercare: Secondary | ICD-10-CM | POA: Diagnosis not present

## 2018-05-09 DIAGNOSIS — I5030 Unspecified diastolic (congestive) heart failure: Secondary | ICD-10-CM | POA: Diagnosis not present

## 2018-05-09 DIAGNOSIS — E1122 Type 2 diabetes mellitus with diabetic chronic kidney disease: Secondary | ICD-10-CM | POA: Diagnosis not present

## 2018-05-09 DIAGNOSIS — F0390 Unspecified dementia without behavioral disturbance: Secondary | ICD-10-CM | POA: Diagnosis not present

## 2018-05-09 DIAGNOSIS — I130 Hypertensive heart and chronic kidney disease with heart failure and stage 1 through stage 4 chronic kidney disease, or unspecified chronic kidney disease: Secondary | ICD-10-CM | POA: Diagnosis not present

## 2018-05-09 NOTE — Patient Outreach (Signed)
[place] [facility]) Care Management  05/09/2018  [name] 17-Apr-1940 [id number]   Telephone follow up   Successful outreach call to patient  She discussed having falls, then states that it is not really falls, states that her knees just collapse under her when she stands. Report no injury with fall occurrences.  Discussed follow up with Dr.Duda, she states she followed up with him on last week, knee xray was done and no problems noted. Patient declined follow up with Dr.Duda again on this week.  Patient discussed PT visit on yesterday and plan for again this week. Patient states she plans to have someone with her when she stands.  Patient states that the swelling in her legs is contributing to falls, she is focusing on wearing her specialized socks and elevating legs.  Patient now has [facility] bath aide that visited today and plan for again this week , patient states that helped her on today.   Heart Failure Patient denies shortness of breath, continues to have swelling in her legs. Patient continues to limit salt in diet to less than 2 grams, and fluid intake to less than 2 liters. Patient reports taking medication as prescribed. Patient is still not able to weigh on a regular basis due to mobility. Reviewed with teachback , Heart failure worsening symptoms and action plan.   Appointments.  Patient has completed visit at PCP office in the last week, prescribed antibiotic for UTI , reports she has completed Cipro and UTI cleared. Patient has PCP visit in the next week Patient has labs with nephrology in the next week and visit with MD the next week.  Patient continues to look into PCP closer to her home, and has new patient paperwork to complete for Primary care at  [facility] .  Plan  Will plan follow up call in the next 2 weeks  Reinforced patient to notify MD of reoccurrence of falls, Fall prevention education discussed .  Patient will attend all scheduled medical  appointments.   THN CM Care Plan Problem One     Most Recent Value  Care Plan Problem One  Risk for readmission recent discharge from SNF related to ankle fusion surgery   Role Documenting the Problem One  Care Management [place] for Problem One  Active  [facility] Long Term Goal   Patient will not experience a hospital readmission in the next 60 days   THN Long Term Goal Start Date  03/23/18  Interventions for Problem One Long Term Goal  Discussed current clinical state, reinforced notifying MD of recurrent falls, or concerns.   THN CM Short Term Goal #1   Patient will report attending all medical appointments over the next 30 days   THN CM Short Term Goal #1 Start Date  03/23/18  [facility] CM Short Term Goal #1 Met Date  05/02/18  THN CM Short Term Goal #2   Over the next 30 days patient will be able to verbalize improvement in mobility   THN CM Short Term Goal #2 Start Date  05/02/18 Sudie Grumbling restarted due to increased number of falls.]  Interventions for Short Term Goal #2  Discussed continued participation in home therapy.  THN CM Short Term Goal #3  Over the next 30 days patient will report decrease swelling in right leg over the next 30 days  [goal reactivated ]  THN CM Short Term Goal #3 Start Date  05/09/18  Interventions for Short Tern Goal #3  Encouraged patient  elevating legs while sitting during the day, reinforced wearing specialized stocking by Dr.Duda.   THN CM Short Term Goal #4  Patient will be able to report decrease in falls over the next 30 days   THN CM Short Term Goal #4 Start Date  05/09/18  Interventions for Short Term Goal #4  Discussed fall prevention measures, advised standing a few minutes prior to starting to walk, enouraged to have someone nearby when starting to walk,       [name], RN, [place] Management Coordinator  [phone]- Mobile [phone]- [place] .

## 2018-05-10 DIAGNOSIS — E1122 Type 2 diabetes mellitus with diabetic chronic kidney disease: Secondary | ICD-10-CM | POA: Diagnosis not present

## 2018-05-10 DIAGNOSIS — F0390 Unspecified dementia without behavioral disturbance: Secondary | ICD-10-CM | POA: Diagnosis not present

## 2018-05-10 DIAGNOSIS — I5030 Unspecified diastolic (congestive) heart failure: Secondary | ICD-10-CM | POA: Diagnosis not present

## 2018-05-10 DIAGNOSIS — Z4789 Encounter for other orthopedic aftercare: Secondary | ICD-10-CM | POA: Diagnosis not present

## 2018-05-10 DIAGNOSIS — I130 Hypertensive heart and chronic kidney disease with heart failure and stage 1 through stage 4 chronic kidney disease, or unspecified chronic kidney disease: Secondary | ICD-10-CM | POA: Diagnosis not present

## 2018-05-10 DIAGNOSIS — C50411 Malignant neoplasm of upper-outer quadrant of right female breast: Secondary | ICD-10-CM | POA: Diagnosis not present

## 2018-05-15 DIAGNOSIS — E1122 Type 2 diabetes mellitus with diabetic chronic kidney disease: Secondary | ICD-10-CM | POA: Diagnosis not present

## 2018-05-15 DIAGNOSIS — C50411 Malignant neoplasm of upper-outer quadrant of right female breast: Secondary | ICD-10-CM | POA: Diagnosis not present

## 2018-05-15 DIAGNOSIS — F0390 Unspecified dementia without behavioral disturbance: Secondary | ICD-10-CM | POA: Diagnosis not present

## 2018-05-15 DIAGNOSIS — Z4789 Encounter for other orthopedic aftercare: Secondary | ICD-10-CM | POA: Diagnosis not present

## 2018-05-15 DIAGNOSIS — I130 Hypertensive heart and chronic kidney disease with heart failure and stage 1 through stage 4 chronic kidney disease, or unspecified chronic kidney disease: Secondary | ICD-10-CM | POA: Diagnosis not present

## 2018-05-15 DIAGNOSIS — I5030 Unspecified diastolic (congestive) heart failure: Secondary | ICD-10-CM | POA: Diagnosis not present

## 2018-05-16 DIAGNOSIS — N184 Chronic kidney disease, stage 4 (severe): Secondary | ICD-10-CM | POA: Diagnosis not present

## 2018-05-16 DIAGNOSIS — I10 Essential (primary) hypertension: Secondary | ICD-10-CM | POA: Diagnosis not present

## 2018-05-16 DIAGNOSIS — E1165 Type 2 diabetes mellitus with hyperglycemia: Secondary | ICD-10-CM | POA: Diagnosis not present

## 2018-05-16 DIAGNOSIS — E7800 Pure hypercholesterolemia, unspecified: Secondary | ICD-10-CM | POA: Diagnosis not present

## 2018-05-16 DIAGNOSIS — G609 Hereditary and idiopathic neuropathy, unspecified: Secondary | ICD-10-CM | POA: Diagnosis not present

## 2018-05-16 DIAGNOSIS — N189 Chronic kidney disease, unspecified: Secondary | ICD-10-CM | POA: Diagnosis not present

## 2018-05-17 DIAGNOSIS — Z4789 Encounter for other orthopedic aftercare: Secondary | ICD-10-CM | POA: Diagnosis not present

## 2018-05-17 DIAGNOSIS — F0390 Unspecified dementia without behavioral disturbance: Secondary | ICD-10-CM | POA: Diagnosis not present

## 2018-05-17 DIAGNOSIS — I5030 Unspecified diastolic (congestive) heart failure: Secondary | ICD-10-CM | POA: Diagnosis not present

## 2018-05-17 DIAGNOSIS — I130 Hypertensive heart and chronic kidney disease with heart failure and stage 1 through stage 4 chronic kidney disease, or unspecified chronic kidney disease: Secondary | ICD-10-CM | POA: Diagnosis not present

## 2018-05-17 DIAGNOSIS — E1122 Type 2 diabetes mellitus with diabetic chronic kidney disease: Secondary | ICD-10-CM | POA: Diagnosis not present

## 2018-05-17 DIAGNOSIS — C50411 Malignant neoplasm of upper-outer quadrant of right female breast: Secondary | ICD-10-CM | POA: Diagnosis not present

## 2018-05-18 ENCOUNTER — Institutional Professional Consult (permissible substitution): Payer: Medicare Other | Admitting: Neurology

## 2018-05-18 DIAGNOSIS — I130 Hypertensive heart and chronic kidney disease with heart failure and stage 1 through stage 4 chronic kidney disease, or unspecified chronic kidney disease: Secondary | ICD-10-CM | POA: Diagnosis not present

## 2018-05-18 DIAGNOSIS — C50411 Malignant neoplasm of upper-outer quadrant of right female breast: Secondary | ICD-10-CM | POA: Diagnosis not present

## 2018-05-18 DIAGNOSIS — E1122 Type 2 diabetes mellitus with diabetic chronic kidney disease: Secondary | ICD-10-CM | POA: Diagnosis not present

## 2018-05-18 DIAGNOSIS — F0390 Unspecified dementia without behavioral disturbance: Secondary | ICD-10-CM | POA: Diagnosis not present

## 2018-05-18 DIAGNOSIS — Z4789 Encounter for other orthopedic aftercare: Secondary | ICD-10-CM | POA: Diagnosis not present

## 2018-05-18 DIAGNOSIS — I5030 Unspecified diastolic (congestive) heart failure: Secondary | ICD-10-CM | POA: Diagnosis not present

## 2018-05-19 DIAGNOSIS — I2510 Atherosclerotic heart disease of native coronary artery without angina pectoris: Secondary | ICD-10-CM | POA: Diagnosis not present

## 2018-05-19 DIAGNOSIS — Z1389 Encounter for screening for other disorder: Secondary | ICD-10-CM | POA: Diagnosis not present

## 2018-05-19 DIAGNOSIS — Z6829 Body mass index (BMI) 29.0-29.9, adult: Secondary | ICD-10-CM | POA: Diagnosis not present

## 2018-05-19 DIAGNOSIS — Z0001 Encounter for general adult medical examination with abnormal findings: Secondary | ICD-10-CM | POA: Diagnosis not present

## 2018-05-19 DIAGNOSIS — M1990 Unspecified osteoarthritis, unspecified site: Secondary | ICD-10-CM | POA: Diagnosis not present

## 2018-05-19 DIAGNOSIS — I509 Heart failure, unspecified: Secondary | ICD-10-CM | POA: Diagnosis not present

## 2018-05-19 DIAGNOSIS — E1142 Type 2 diabetes mellitus with diabetic polyneuropathy: Secondary | ICD-10-CM | POA: Diagnosis not present

## 2018-05-19 DIAGNOSIS — E663 Overweight: Secondary | ICD-10-CM | POA: Diagnosis not present

## 2018-05-19 DIAGNOSIS — I10 Essential (primary) hypertension: Secondary | ICD-10-CM | POA: Diagnosis not present

## 2018-05-22 DIAGNOSIS — B0223 Postherpetic polyneuropathy: Secondary | ICD-10-CM | POA: Diagnosis not present

## 2018-05-22 DIAGNOSIS — E1122 Type 2 diabetes mellitus with diabetic chronic kidney disease: Secondary | ICD-10-CM | POA: Diagnosis not present

## 2018-05-22 DIAGNOSIS — M10071 Idiopathic gout, right ankle and foot: Secondary | ICD-10-CM | POA: Diagnosis not present

## 2018-05-22 DIAGNOSIS — Z4789 Encounter for other orthopedic aftercare: Secondary | ICD-10-CM | POA: Diagnosis not present

## 2018-05-22 DIAGNOSIS — R2689 Other abnormalities of gait and mobility: Secondary | ICD-10-CM | POA: Diagnosis not present

## 2018-05-22 DIAGNOSIS — N183 Chronic kidney disease, stage 3 (moderate): Secondary | ICD-10-CM | POA: Diagnosis not present

## 2018-05-22 DIAGNOSIS — C50411 Malignant neoplasm of upper-outer quadrant of right female breast: Secondary | ICD-10-CM | POA: Diagnosis not present

## 2018-05-22 DIAGNOSIS — E1165 Type 2 diabetes mellitus with hyperglycemia: Secondary | ICD-10-CM | POA: Diagnosis not present

## 2018-05-22 DIAGNOSIS — Z9181 History of falling: Secondary | ICD-10-CM | POA: Diagnosis not present

## 2018-05-22 DIAGNOSIS — Z794 Long term (current) use of insulin: Secondary | ICD-10-CM | POA: Diagnosis not present

## 2018-05-22 DIAGNOSIS — Z981 Arthrodesis status: Secondary | ICD-10-CM | POA: Diagnosis not present

## 2018-05-22 DIAGNOSIS — F0390 Unspecified dementia without behavioral disturbance: Secondary | ICD-10-CM | POA: Diagnosis not present

## 2018-05-22 DIAGNOSIS — I5030 Unspecified diastolic (congestive) heart failure: Secondary | ICD-10-CM | POA: Diagnosis not present

## 2018-05-22 DIAGNOSIS — I130 Hypertensive heart and chronic kidney disease with heart failure and stage 1 through stage 4 chronic kidney disease, or unspecified chronic kidney disease: Secondary | ICD-10-CM | POA: Diagnosis not present

## 2018-05-23 DIAGNOSIS — E1122 Type 2 diabetes mellitus with diabetic chronic kidney disease: Secondary | ICD-10-CM | POA: Diagnosis not present

## 2018-05-23 DIAGNOSIS — F0390 Unspecified dementia without behavioral disturbance: Secondary | ICD-10-CM | POA: Diagnosis not present

## 2018-05-23 DIAGNOSIS — I129 Hypertensive chronic kidney disease with stage 1 through stage 4 chronic kidney disease, or unspecified chronic kidney disease: Secondary | ICD-10-CM | POA: Diagnosis not present

## 2018-05-23 DIAGNOSIS — C50411 Malignant neoplasm of upper-outer quadrant of right female breast: Secondary | ICD-10-CM | POA: Diagnosis not present

## 2018-05-23 DIAGNOSIS — Z4789 Encounter for other orthopedic aftercare: Secondary | ICD-10-CM | POA: Diagnosis not present

## 2018-05-23 DIAGNOSIS — N2581 Secondary hyperparathyroidism of renal origin: Secondary | ICD-10-CM | POA: Diagnosis not present

## 2018-05-23 DIAGNOSIS — I5030 Unspecified diastolic (congestive) heart failure: Secondary | ICD-10-CM | POA: Diagnosis not present

## 2018-05-23 DIAGNOSIS — I130 Hypertensive heart and chronic kidney disease with heart failure and stage 1 through stage 4 chronic kidney disease, or unspecified chronic kidney disease: Secondary | ICD-10-CM | POA: Diagnosis not present

## 2018-05-23 DIAGNOSIS — D631 Anemia in chronic kidney disease: Secondary | ICD-10-CM | POA: Diagnosis not present

## 2018-05-23 DIAGNOSIS — N184 Chronic kidney disease, stage 4 (severe): Secondary | ICD-10-CM | POA: Diagnosis not present

## 2018-05-24 ENCOUNTER — Other Ambulatory Visit: Payer: Self-pay | Admitting: *Deleted

## 2018-05-24 NOTE — Patient Outreach (Signed)
[facility]) Care Management  05/24/2018  [name] March 20, 1941 [id number]  Telephone follow up call  Patient PMHx includes not not limited to : Chronic Diastolic heart failure, Diabetes, CKA, Hypertension, gout, mild cognitive disorder. Right breast CA,  Patient with recent admission to [facility] on 11/27 for right subtalar and talonavicular fusion  Noted readmission 11/29- 12/5 for sepsis, right severe foot pain , hypotension .  Patient discharged from [facility] on 12/24.  Successful outreach call to patient, she discussed feeling some better and have received some good news at recent MD visits.   Patient discussed recent visit to PCP and reports that her thyroid levels was off and a prescription has been called in .   She further discussed : Right ankle surgery/fall history  - Patient discussed being able to put her regular tennis shoes on today  Denies recent falls, making good progress with home health therapy, next visit on tomorrow and has been approved for additional 4 weeks . Patient reports using her walker more than mobility by pedaling in  wheelchair. Patient discussed being able to do ADL's with the help of her husband.  Diabetes- recent visit with Dr.Balen,A1c 6, adjustments in humulin N insulin to 20 units twice daily and sliding scale humalog for blood sugar over 151 , patient has not required sliding scale since new orders. Patient reports blood sugar was 93 on today,continues to monitor twice daily and keeping good records.Patient reports continuing to focus on eating a balanced diet  Heart failure  Patient reports decrease in swelling of lower legs, wearing compression hose. She denies increase of shortness of breath or sudden weight gain , as yellow symptoms of Heart failure reviewed. Patient continues to limits salt in diet to less than 2 grams  and fluids to less than 2 liters per day.   Appointments Patient continues to consider transition to  PCP closer to home at [facility] , she hasn't completed new patient paperwork from the office or scheduled a visit yet. Patient has follow up visit with Dr.Duda on  3/5.  Plan  Will plan follow up call in the next month and consider to health coach for continued education and support of chronic conditions.   THN CM Care Plan Problem One     Most Recent Value  Care Plan Problem One  Risk for readmission recent discharge from SNF related to ankle fusion surgery   Role Documenting the Problem One  Care Management [place] for Problem One  Active  [facility] Long Term Goal   Patient will not experience a hospital readmission in the next 60 days   THN Long Term Goal Start Date  03/23/18  Interventions for Problem One Long Term Goal  Reviewed current clinical state,  reinforced worsening yellow zone symptoms of heart failure and when to call, encouraged to notify MD of recurrent falls, decrease in moblity.  THN CM Short Term Goal #1   Patient will report attending all medical appointments over the next 30 days   THN CM Short Term Goal #1 Start Date  03/23/18  [facility] CM Short Term Goal #1 Met Date  05/02/18  THN CM Short Term Goal #2   Over the next 30 days patient will be able to verbalize improvement in mobility   [facility] CM Short Term Goal #2 Start Date  05/02/18 Sudie Grumbling restarted due to increased number of falls.]  THN CM Short Term Goal #2 Met Date  05/24/18  THN CM Short Term Goal #3  Over the next 30 days patient will report decrease swelling in right leg over the next 30 days  [goal reactivated ]  THN CM Short Term Goal #3 Start Date  05/09/18  Interventions for Short Tern Goal #3  Reviewed progress swelling reinforced continuing to elevate legs during the day, and wearing compression hose   THN CM Short Term Goal #4  Patient will be able to report decrease in  falls over the next 30 days   THN CM Short Term Goal #4 Start Date  05/09/18  Interventions for Short Term Goal #4  Reviewed fall precautions with teachback      [name], RN, [place] Management Coordinator  [phone]- Mobile [phone]- [place]

## 2018-05-25 DIAGNOSIS — F0390 Unspecified dementia without behavioral disturbance: Secondary | ICD-10-CM | POA: Diagnosis not present

## 2018-05-25 DIAGNOSIS — I130 Hypertensive heart and chronic kidney disease with heart failure and stage 1 through stage 4 chronic kidney disease, or unspecified chronic kidney disease: Secondary | ICD-10-CM | POA: Diagnosis not present

## 2018-05-25 DIAGNOSIS — E1122 Type 2 diabetes mellitus with diabetic chronic kidney disease: Secondary | ICD-10-CM | POA: Diagnosis not present

## 2018-05-25 DIAGNOSIS — Z4789 Encounter for other orthopedic aftercare: Secondary | ICD-10-CM | POA: Diagnosis not present

## 2018-05-25 DIAGNOSIS — I5030 Unspecified diastolic (congestive) heart failure: Secondary | ICD-10-CM | POA: Diagnosis not present

## 2018-05-25 DIAGNOSIS — C50411 Malignant neoplasm of upper-outer quadrant of right female breast: Secondary | ICD-10-CM | POA: Diagnosis not present

## 2018-05-31 DIAGNOSIS — E1122 Type 2 diabetes mellitus with diabetic chronic kidney disease: Secondary | ICD-10-CM | POA: Diagnosis not present

## 2018-05-31 DIAGNOSIS — I130 Hypertensive heart and chronic kidney disease with heart failure and stage 1 through stage 4 chronic kidney disease, or unspecified chronic kidney disease: Secondary | ICD-10-CM | POA: Diagnosis not present

## 2018-05-31 DIAGNOSIS — F0390 Unspecified dementia without behavioral disturbance: Secondary | ICD-10-CM | POA: Diagnosis not present

## 2018-05-31 DIAGNOSIS — C50411 Malignant neoplasm of upper-outer quadrant of right female breast: Secondary | ICD-10-CM | POA: Diagnosis not present

## 2018-05-31 DIAGNOSIS — Z4789 Encounter for other orthopedic aftercare: Secondary | ICD-10-CM | POA: Diagnosis not present

## 2018-05-31 DIAGNOSIS — I5030 Unspecified diastolic (congestive) heart failure: Secondary | ICD-10-CM | POA: Diagnosis not present

## 2018-06-01 ENCOUNTER — Encounter (INDEPENDENT_AMBULATORY_CARE_PROVIDER_SITE_OTHER): Payer: Self-pay | Admitting: Physician Assistant

## 2018-06-01 ENCOUNTER — Ambulatory Visit (INDEPENDENT_AMBULATORY_CARE_PROVIDER_SITE_OTHER): Payer: Medicare Other | Admitting: Physician Assistant

## 2018-06-01 VITALS — Ht 66.0 in | Wt 182.0 lb

## 2018-06-01 DIAGNOSIS — Z981 Arthrodesis status: Secondary | ICD-10-CM

## 2018-06-01 DIAGNOSIS — R531 Weakness: Secondary | ICD-10-CM

## 2018-06-01 DIAGNOSIS — E1142 Type 2 diabetes mellitus with diabetic polyneuropathy: Secondary | ICD-10-CM

## 2018-06-01 DIAGNOSIS — M1A0720 Idiopathic chronic gout, left ankle and foot, without tophus (tophi): Secondary | ICD-10-CM

## 2018-06-01 DIAGNOSIS — M25562 Pain in left knee: Secondary | ICD-10-CM

## 2018-06-01 DIAGNOSIS — Z96652 Presence of left artificial knee joint: Secondary | ICD-10-CM

## 2018-06-01 DIAGNOSIS — N189 Chronic kidney disease, unspecified: Secondary | ICD-10-CM

## 2018-06-01 MED ORDER — FEBUXOSTAT 40 MG PO TABS: 40.0000 mg | ORAL_TABLET | Freq: Every day | ORAL | Status: DC

## 2018-06-01 MED ORDER — OXYCODONE-ACETAMINOPHEN 2.5-325 MG PO TABS: 1.0000 | ORAL_TABLET | Freq: Four times a day (QID) | ORAL | 0 refills | Status: DC | PRN

## 2018-06-01 NOTE — Progress Notes (Signed)
Office Visit Note   Patient: [name]           Date of Birth: Dec 04, 1940           MRN: [id number] Visit Date: 06/01/2018              Requested by: Redmond [address], [place], [place] [address] PCP: Redmond School, MD  Chief Complaint  Patient presents with  . Right Foot - Follow-up    02/22/18 right subtalar and talonavicular fusion   . Left Knee - Pain      HPI: The patient is a 78 year old female here with her husband for postoperative follow-up following right subtalar and talonavicular fusion on 02/22/2018.  She has been working with physical therapy at home and does report that this is improved her status and she has been more ambulatory with a walker and not following as much.  She is wearing a regular shoe and a silver compression stocking.  She reports less swelling over the right ankle.  She reports that she is been improving with regards to her kidneys and she has been drinking some coconut water which is been helping with her potassium levels.  She was recently also started on Synthroid as she is hypothyroid. Her main issue today is pain in her left knee.  She does have a knee replacement on the left side.  She thinks that as she was favoring the right side she was putting too much weight on the left side and was causing problems with her knee.  Her husband did increase her gabapentin some but this has not changed her knee pain.  She is worried that something might be wrong with the knee. We also discussed that her uric acid level is still 7.3 which is elevated despite allopurinol 100 mg p.o. twice daily.  This was discussed with Dr. Sharol Given and working to start Uloric 40 mg daily as she is not fully responded to the allopurinol.  Assessment & Plan: Visit Diagnoses:  1. Idiopathic chronic gout of left foot without tophus   2. Status post ankle fusion   3. Total knee replacement status, left   4. Acute pain of left knee   5. Type 2 diabetes mellitus  with diabetic polyneuropathy, unspecified whether long term insulin use ([place])   6. Chronic kidney disease, unspecified CKD stage   7. Weakness generalized     Plan: Counseled the patient to continue to work with physical therapy for overall strengthening and conditioning.  Counseled the patient that her previous radiographs of the left total knee show excellent position alignment and no evidence of radiolucency about the knee replacement.  We feel some of her knee pain is related to her generalized weakness and she needs to continue to work on strengthening.  Some of her pain may also be related to her gouty arthropathy and will switch to Uloric as she has not fully responded to allopurinol.  We also discussed avoiding animal protein is much as possible and supplementing with a whey protein or similar supplements to increase her protein level.  She will follow-up here in 4 weeks or sooner should she have difficulties in the interim.  Follow-Up Instructions: Return in about 4 weeks (around 06/29/2018).   Ortho Exam  Patient is alert, oriented, no adenopathy, well-dressed, normal affect, normal respiratory effort. The patient's right ankle incisions are healing well without hypertrophic scar.  There is minimal localized edema about the ankle.  She has good pedal  pulses.  There are no signs of cellulitis or infection.  The left knee shows a well-healed total knee replacement scar.  She does have good passive range of motion but actively has weakness in the quadriceps muscle still.  She is neurovascularly intact distally.  She is ambulatory with a walker with minimally antalgic appearing gait.  Imaging: No results found. No images are attached to the encounter.  Labs: Lab Results  Component Value Date   HGBA1C 7.1 (H) 02/24/2018   HGBA1C 7.1 (H) 02/22/2018   HGBA1C 5.7 (H) 12/07/2016   LABURIC 7.3 (H) 05/04/2018   LABURIC 9.0 (H) 02/25/2018   LABURIC 10.2 (H) 02/16/2018   REPTSTATUS 02/26/2018  FINAL 02/25/2018   CULT  02/25/2018    NO GROWTH Performed at [facility], [place] [address], [place], [place] [address]    LABORGA ESCHERICHIA COLI 12/31/2009     Lab Results  Component Value Date   ALBUMIN 2.3 (L) 03/01/2018   ALBUMIN 2.3 (L) 02/27/2018   ALBUMIN 2.7 (L) 02/25/2018   LABURIC 7.3 (H) 05/04/2018   LABURIC 9.0 (H) 02/25/2018   LABURIC 10.2 (H) 02/16/2018    Body mass index is 29.38 kg/m.  Orders:  No orders of the defined types were placed in this encounter.  Meds ordered this encounter  Medications  . DISCONTD: febuxostat (ULORIC) tablet 40 mg  . DISCONTD: oxycodone-acetaminophen (PERCOCET) 2.5-325 MG tablet    Sig: Take 1 tablet by mouth every 6 (six) hours as needed for pain.    Dispense:  28 tablet    Refill:  0  . oxyCODONE-acetaminophen (PERCOCET/ROXICET) 5-325 MG tablet    Sig: Take 1 tablet by mouth every 6 (six) hours as needed for severe pain.    Dispense:  28 tablet    Refill:  0  . febuxostat (ULORIC) 40 MG tablet    Sig: Take 1 tablet (40 mg total) by mouth daily.    Dispense:  30 tablet    Refill:  3    Patient's uric acid still elevated despite allopurinol for many months     Procedures: No procedures performed  Clinical Data: No additional findings.  ROS:  All other systems negative, except as noted in the HPI. Review of Systems  Objective: Vital Signs: Ht '5\' 6"'$  (1.676 m)   Wt 182 lb (82.6 kg)   BMI 29.38 kg/m   Specialty Comments:  No specialty comments available.  PMFS History: Patient Active Problem List   Diagnosis Date Noted  . Somnolence, daytime 04/06/2018  . Acute on chronic diastolic CHF (congestive heart failure) ([place]) 02/28/2018  . SOB (shortness of breath)   . Hypotension   . Elevated troponin 02/25/2018  . Hypomagnesemia 02/25/2018  . Hypokalemia 02/24/2018  . Urinary retention 02/24/2018  . Sepsis ([place]) 02/24/2018  . Posterior tibial tendinitis of right leg 02/22/2018  . Posterior tibial  tendinitis, right leg   . Right atrial mass   . Chronic diastolic heart failure ([place]) 08/01/2017  . Genetic testing 12/03/2016  . 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 Malignant neoplasm of upper-outer quadrant of left breast in female, estrogen receptor positive ([place]) 11/19/2016  . Chronic idiopathic gout involving toe of left foot without tophus 11/15/2016  . Malignant 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, estrogen receptor positive ([place]) 11/09/2016  . Tendon tear, ankle, left, sequela   . Traumatic rupture of left anterior tibial tendon 04/20/2016  . Acute kidney injury superimposed on CKD ([place])   . Uncontrolled type  2 diabetes mellitus with complication ([place])   . Headache, migraine   . Unexplained weight loss 11/17/2015  . 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 Restless legs syndrome 07/24/2015  . Mild cognitive impairment 01/08/2015  . Abnormality of gait 01/08/2015  . Dizziness 12/19/2014  . Preoperative cardiovascular examination 12/11/2013  . GERD (gastroesophageal reflux disease) 10/06/2013  . Syncope 10/05/2013  . Anxiety 10/28/2012    Class: Acute  . Bilateral lower extremity edema 10/28/2012  . CAD S/P percutaneous coronary angioplasty   . Essential hypertension   . Dyslipidemia, goal LDL below 70    Past Medical History:  Diagnosis Date  . Anemia    have received iron infusions  . Anxiety   . Arthritis     osteoarthritis  . Breast cancer ([place])   . Breast cancer in female [facility])    Bilateral  . CAD S/P percutaneous coronary angioplasty 2007; March 2011   [employer] 3.0 mm 20 mm postdilated 3.6 mm in early mid LAD; status post ISR Cutting Balloon PTCA and March 11 along with PCI of distal mid lesion with a 3.0 mm 12 mm MultiLink vision BMS; the proximal stent causes jailing of SP1 and SP2 with ostial 70-80% lesions  . Cancer (HCC)    Melanoma, Squamous cell Carcinoma  . CHF (congestive heart failure) ([place])    . Chronic kidney disease    stage 2   . 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 Dementia ([employer])    mild  . Diabetes mellitus type 2 with neurological manifestations ([place])   . Dyslipidemia, goal LDL below 70   . Full dentures   . Genetic testing 12/03/2016   Germline genetic testing was performed through [facility]'s Common Hereditary Cancers Panel + [facility]'s Melanoma Panel. This custom panel includes analysis of the following 51 genes: APC, ATM, AXIN2, BAP1, BARD1, BMPR1A, BRCA1, BRCA2, BRIP1, CDH1, CDK4, CDKN2A, CHEK2, CTNNA1, DICER1, EPCAM, GREM1, HOXB13, KIT, MEN1, MITF, MLH1, MSH2, MSH3, MSH6, MUTYH, NBN, NF1, NTHL1, PALB2, PDGFRA, PMS2, POLD1, POL  . GERD (gastroesophageal reflux disease)   . Headache(784.0)   . History of hematuria    Followed by Dr. Gaynelle Arabian  . Hypertension, essential, benign   . Nausea & vomiting 10/2015  . Peripheral neuropathy   . Stroke ([place])   . TIA (transient ischemic attack)    multiple in the past  . Wears glasses     Family History  Problem Relation Age of Onset  . Diabetes Mother   . Hypertension Mother   . Kidney disease Mother   . Hypertension Father   . Emphysema Father   . Heart attack Father   . Heart disease Father   . Arthritis Sister   . Diabetes Sister   . Hypertension Sister   . Breast cancer Sister 47       treated with mastectomy  . Cervical cancer Sister 44  . Hypertension Brother   . Hyperlipidemia Brother   . Diabetes Brother   . Stroke Brother   . Diabetes Sister   . Hypertension Sister   . Stomach cancer Sister 17  . Hypertension Brother   . ALS Brother        d.62s  . Breast cancer Daughter 70       triple negative treated with lumpectomy, chemo, radiation  . Heart attack Daughter 8       d.45  . COPD Daughter   . Cancer  Paternal Aunt        unspecified type  . Cancer Paternal Uncle        unspecified type  . Cancer Paternal Uncle        unspecified type    Past Surgical  History:  Procedure Laterality Date  . ABDOMINAL HYSTERECTOMY    . ANKLE FUSION Right 02/22/2018   Procedure: RIGHT SUBTALAR AND TALONAVICULAR FUSION;  Surgeon: Newt Minion, MD;  Location: [facility];  Service: Orthopedics;  Laterality: Right;  . APPENDECTOMY    . BLADDER SUSPENSION    . BREAST LUMPECTOMY Bilateral 2018  . BREAST LUMPECTOMY WITH RADIOACTIVE SEED AND SENTINEL LYMPH NODE BIOPSY Bilateral 12/14/2016   Procedure: BILATERAL BREAST LUMPECTOMY WITH BILATERAL  RADIOACTIVE SEED AND BILATERAL AXILLARY  SENTINEL LYMPH NODE BIOPSY ERAS PATHWAY;  Surgeon: Alphonsa Overall, MD;  Location: [facility];  Service: General;  Laterality: Bilateral;  PECTORAL BLOCK  . CARDIAC CATHETERIZATION  02/24/2006   85% stenosis in the proximal portion of LAD-arrangements made for PCI on 02/25/2006  . CARDIAC CATHETERIZATION  02/25/2006   80% LAD lesion stented with a 3x31m Liberte stent resulting in reduction of 80% lesion to 0% residual  . CARDIAC CATHETERIZATION  04/26/2006   Medical management  . CARDIAC CATHETERIZATION  06/24/2009   60-70% re-stenosis in the proximal LAD. A 3.25x15 cutting balloon, 3 inflations - 14atm-38sec, 13atm-39sec, and 12atm-40sec reduced to less than 10%. 60% stenosis of the mid/distal LAD stented with a 3x145mMultilink stent.  . Marland KitchenARDIAC CATHETERIZATION  07/09/2009   Medical management  . CAROTID DOPPLER  06/24/2009   40-59% R ICA stenosis. No significant ICA stenosis noted  . CHOLECYSTECTOMY    . DILATION AND CURETTAGE OF UTERUS    . JOINT REPLACEMENT Left   . LESION REMOVAL Right 03/11/2015   Procedure:  EXCISION OF RIGHT PRE TIBIAL LESION;  Surgeon: JaJudeth HornMD;  [place] Service: General;  Laterality: Right;  . MASS EXCISION Left 06/10/2014   Procedure: EXCISION LEFT LOWER LEG LESION;  Surgeon: JaDoreen SalvageMD;  [place] [facility] Service: General;  Laterality: Left;  . Marland KitchenASS EXCISION Left 09/05/2015   Procedure: EXCISION OF LEFT  FOREARM  MASS;  Surgeon: JaJudeth HornMD;  [place] Service: General;  Laterality: Left;  . NM MYOVIEW LTD  11/16/2011   5 beats a PVC during recovery.  No skin or infarction.  Reached 5 METs, EKG negative for ischemia   . NM MYOVIEW LTD  10/2011   No ischemia or infarction.  Normal EF.  LOW RISK. (Short bursts of 5 beats NSVT during recovery otherwise normal)  . SHOULDER ARTHROSCOPY  10/15  . SHOULDER ARTHROSCOPY  1995   left  . TEE WITHOUT CARDIOVERSION N/A 08/03/2017   Procedure: TRANSESOPHAGEAL ECHOCARDIOGRAM (TEE);  Surgeon: RaSkeet LatchMD;  [place] [facility] Service: Cardiovascular;; EF 60-65% with no wall motion normalities.  No atrial thrombus noted.  Lightly findings consistent with a lipomatous hypertrophy of the atrial septum.   . TENDON REPAIR Left 05/12/2016   Procedure: Left Anterior Tibial Tendon Reconstruction;  Surgeon: MaNewt MinionMD;  [place] [facility] Service: Orthopedics;  Laterality: Left;  . TOTAL KNEE ARTHROPLASTY  2005   left  . TRANSESOPHAGEAL ECHOCARDIOGRAM  08/03/2016   : EF 60-65% no wall motion normalities.  No atrial thrombus.  Lipomatous hypertrophy of the atrial septum.  No mass noted.  . TRANSTHORACIC ECHOCARDIOGRAM  10/2015  EF 55-60 %. Mild LVH. Mild pulmonary hypertension. Normal valves.  . TRANSTHORACIC ECHOCARDIOGRAM  07/2017   Vigorous LV function.  EF 65-70%.  Mild diastolic dysfunction with no RWMA. GR 1 DD.  Mild aortic sclerosis/no stenosis.  Questionable right atrial mass discounted with TEE)   . WRIST ARTHROPLASTY  2010   cancer lt wrist   Social History   Occupational History  . Occupation: retired    Fish farm manager: RETIRED  Tobacco Use  . Smoking status: Never Smoker  . Smokeless tobacco: Never Used  Substance and Sexual Activity  . Alcohol use: No  . Drug use: No  . Sexual activity: Never

## 2018-06-02 ENCOUNTER — Encounter (INDEPENDENT_AMBULATORY_CARE_PROVIDER_SITE_OTHER): Payer: Self-pay | Admitting: Physician Assistant

## 2018-06-02 ENCOUNTER — Telehealth (INDEPENDENT_AMBULATORY_CARE_PROVIDER_SITE_OTHER): Payer: Self-pay

## 2018-06-02 DIAGNOSIS — Z4789 Encounter for other orthopedic aftercare: Secondary | ICD-10-CM | POA: Diagnosis not present

## 2018-06-02 DIAGNOSIS — F0390 Unspecified dementia without behavioral disturbance: Secondary | ICD-10-CM | POA: Diagnosis not present

## 2018-06-02 DIAGNOSIS — I5030 Unspecified diastolic (congestive) heart failure: Secondary | ICD-10-CM | POA: Diagnosis not present

## 2018-06-02 DIAGNOSIS — E1122 Type 2 diabetes mellitus with diabetic chronic kidney disease: Secondary | ICD-10-CM | POA: Diagnosis not present

## 2018-06-02 DIAGNOSIS — I130 Hypertensive heart and chronic kidney disease with heart failure and stage 1 through stage 4 chronic kidney disease, or unspecified chronic kidney disease: Secondary | ICD-10-CM | POA: Diagnosis not present

## 2018-06-02 DIAGNOSIS — C50411 Malignant neoplasm of upper-outer quadrant of right female breast: Secondary | ICD-10-CM | POA: Diagnosis not present

## 2018-06-02 MED ORDER — OXYCODONE-ACETAMINOPHEN 5-325 MG PO TABS: 1.0000 | ORAL_TABLET | Freq: Four times a day (QID) | ORAL | 0 refills | Status: DC | PRN

## 2018-06-02 MED ORDER — FEBUXOSTAT 40 MG PO TABS: 40.0000 mg | ORAL_TABLET | Freq: Every day | ORAL | 3 refills | Status: DC

## 2018-06-02 NOTE — Telephone Encounter (Signed)
Both sent and discussed with the husband.

## 2018-06-02 NOTE — Telephone Encounter (Signed)
Please see below. thanks

## 2018-06-02 NOTE — Telephone Encounter (Signed)
Patients husband called stating that patient was supposed to get a prescription for uloric yesterday at [facility] but neither of their pharmacy's have record of this. [facility].  Also the rx for the pain medication, the pharmacy does not carry. Oxy 2.5mg  was written but pharmacy will have to special order this. The husband stated they need this fixed today because she has been in severe pain since yesterday. He is asking for oxy 5mg  to be sent to [facility] pharmacy on file.  Contact number for husband is [phone]

## 2018-06-05 DIAGNOSIS — Z4789 Encounter for other orthopedic aftercare: Secondary | ICD-10-CM | POA: Diagnosis not present

## 2018-06-05 DIAGNOSIS — F0390 Unspecified dementia without behavioral disturbance: Secondary | ICD-10-CM | POA: Diagnosis not present

## 2018-06-05 DIAGNOSIS — I5030 Unspecified diastolic (congestive) heart failure: Secondary | ICD-10-CM | POA: Diagnosis not present

## 2018-06-05 DIAGNOSIS — C50411 Malignant neoplasm of upper-outer quadrant of right female breast: Secondary | ICD-10-CM | POA: Diagnosis not present

## 2018-06-05 DIAGNOSIS — I130 Hypertensive heart and chronic kidney disease with heart failure and stage 1 through stage 4 chronic kidney disease, or unspecified chronic kidney disease: Secondary | ICD-10-CM | POA: Diagnosis not present

## 2018-06-05 DIAGNOSIS — E1122 Type 2 diabetes mellitus with diabetic chronic kidney disease: Secondary | ICD-10-CM | POA: Diagnosis not present

## 2018-06-07 DIAGNOSIS — Z4789 Encounter for other orthopedic aftercare: Secondary | ICD-10-CM | POA: Diagnosis not present

## 2018-06-07 DIAGNOSIS — I5030 Unspecified diastolic (congestive) heart failure: Secondary | ICD-10-CM | POA: Diagnosis not present

## 2018-06-07 DIAGNOSIS — C50411 Malignant neoplasm of upper-outer quadrant of right female breast: Secondary | ICD-10-CM | POA: Diagnosis not present

## 2018-06-07 DIAGNOSIS — E1122 Type 2 diabetes mellitus with diabetic chronic kidney disease: Secondary | ICD-10-CM | POA: Diagnosis not present

## 2018-06-07 DIAGNOSIS — F0390 Unspecified dementia without behavioral disturbance: Secondary | ICD-10-CM | POA: Diagnosis not present

## 2018-06-07 DIAGNOSIS — I130 Hypertensive heart and chronic kidney disease with heart failure and stage 1 through stage 4 chronic kidney disease, or unspecified chronic kidney disease: Secondary | ICD-10-CM | POA: Diagnosis not present

## 2018-06-09 IMAGING — MG BREAST SURGICAL SPECIMEN
2 series · 2 of 2 positions shown · non-contrast
Comparison: Previous exam(s).

CLINICAL DATA: Specimen radiograph status post left breast
lumpectomy.

EXAM:
SPECIMEN RADIOGRAPH OF THE LEFT BREAST

[R]
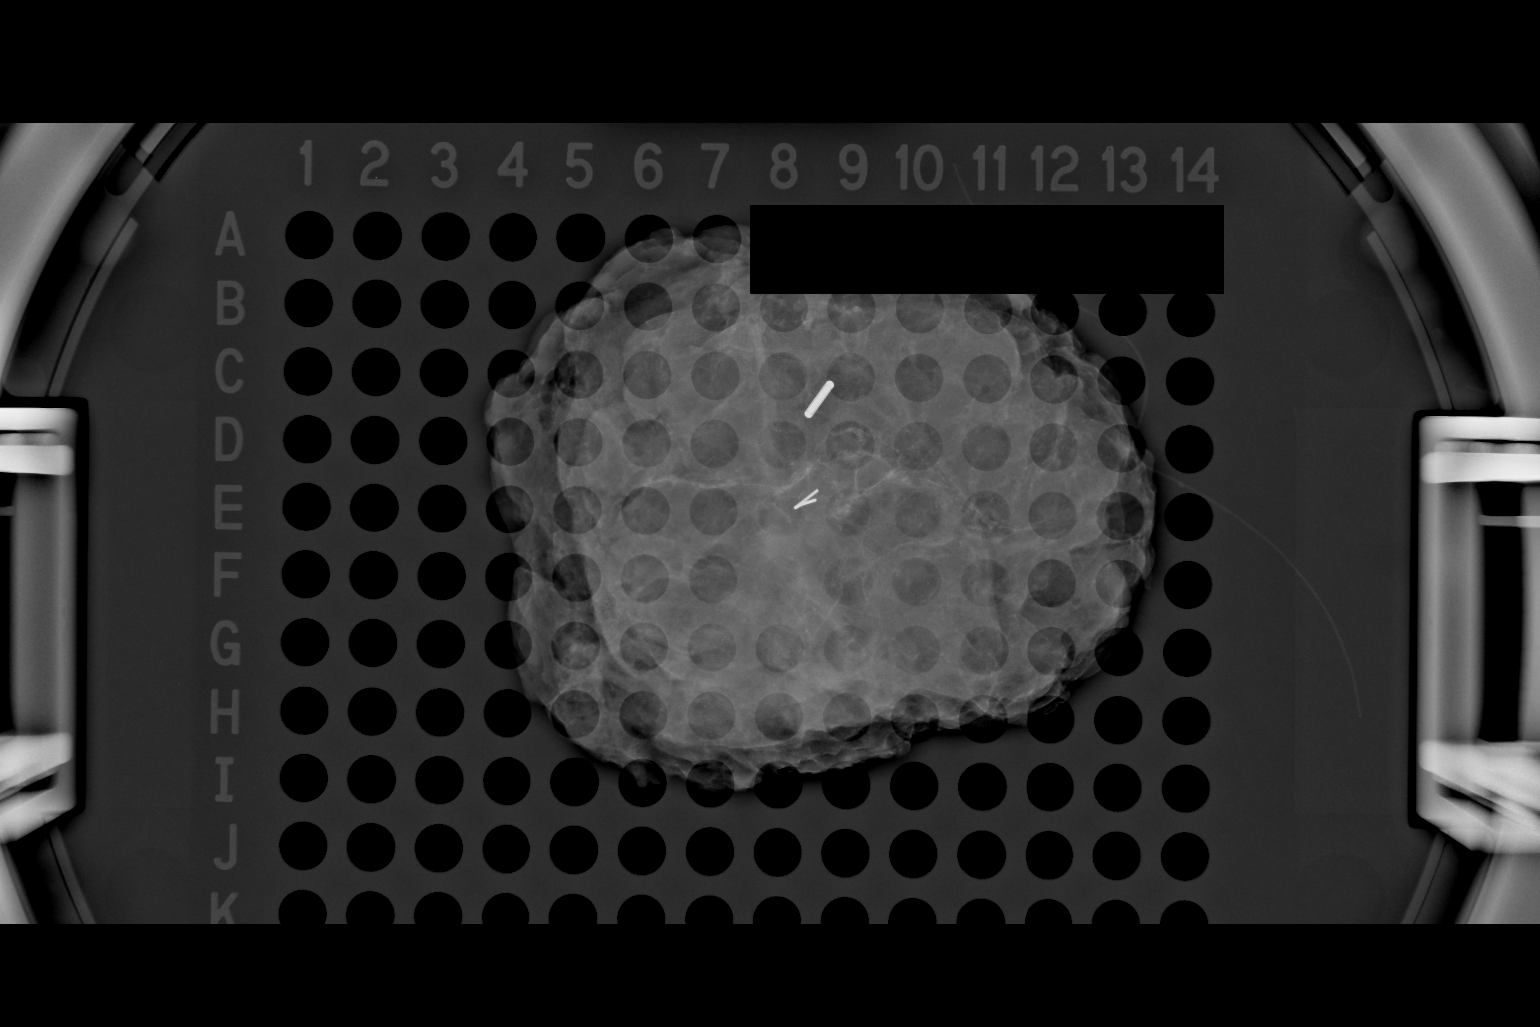

[L]
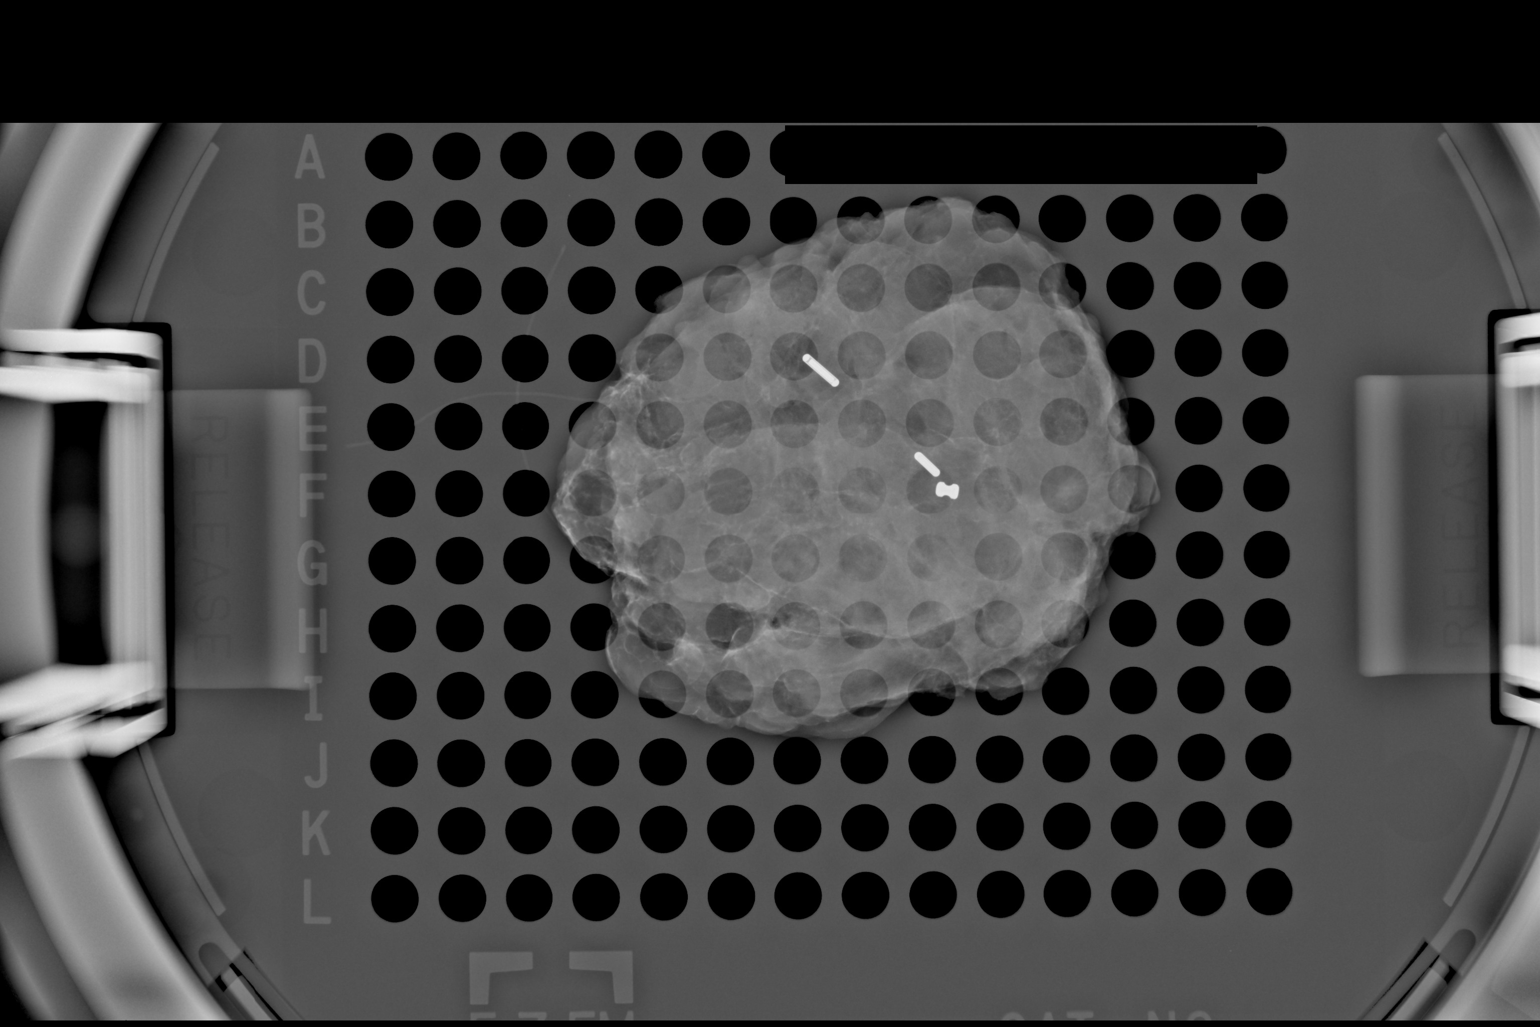

[2 of 2 positions shown; findings below may reference images not displayed]

FINDINGS: Status post excision of the left breast. The 2 radioactive seeds and
biopsy marker clip are present, completely intact, and were marked
for pathology. These findings were communicated with the OR at [DATE]
a.m..
IMPRESSION: Specimen radiograph of the left breast.

## 2018-06-12 DIAGNOSIS — F0390 Unspecified dementia without behavioral disturbance: Secondary | ICD-10-CM | POA: Diagnosis not present

## 2018-06-12 DIAGNOSIS — I130 Hypertensive heart and chronic kidney disease with heart failure and stage 1 through stage 4 chronic kidney disease, or unspecified chronic kidney disease: Secondary | ICD-10-CM | POA: Diagnosis not present

## 2018-06-12 DIAGNOSIS — C50411 Malignant neoplasm of upper-outer quadrant of right female breast: Secondary | ICD-10-CM | POA: Diagnosis not present

## 2018-06-12 DIAGNOSIS — E1122 Type 2 diabetes mellitus with diabetic chronic kidney disease: Secondary | ICD-10-CM | POA: Diagnosis not present

## 2018-06-12 DIAGNOSIS — Z4789 Encounter for other orthopedic aftercare: Secondary | ICD-10-CM | POA: Diagnosis not present

## 2018-06-12 DIAGNOSIS — I5030 Unspecified diastolic (congestive) heart failure: Secondary | ICD-10-CM | POA: Diagnosis not present

## 2018-06-14 DIAGNOSIS — Z4789 Encounter for other orthopedic aftercare: Secondary | ICD-10-CM | POA: Diagnosis not present

## 2018-06-14 DIAGNOSIS — I5030 Unspecified diastolic (congestive) heart failure: Secondary | ICD-10-CM | POA: Diagnosis not present

## 2018-06-14 DIAGNOSIS — F0390 Unspecified dementia without behavioral disturbance: Secondary | ICD-10-CM | POA: Diagnosis not present

## 2018-06-14 DIAGNOSIS — I130 Hypertensive heart and chronic kidney disease with heart failure and stage 1 through stage 4 chronic kidney disease, or unspecified chronic kidney disease: Secondary | ICD-10-CM | POA: Diagnosis not present

## 2018-06-14 DIAGNOSIS — C50411 Malignant neoplasm of upper-outer quadrant of right female breast: Secondary | ICD-10-CM | POA: Diagnosis not present

## 2018-06-14 DIAGNOSIS — E1122 Type 2 diabetes mellitus with diabetic chronic kidney disease: Secondary | ICD-10-CM | POA: Diagnosis not present

## 2018-06-19 ENCOUNTER — Telehealth: Payer: Self-pay | Admitting: Neurology

## 2018-06-19 NOTE — Telephone Encounter (Signed)
If we are set up for virtual visit by Wednesday we can, otherwise we can delay by a few days if needed.

## 2018-06-19 NOTE — Telephone Encounter (Signed)
Pt would like to know if they can have a Virtual appt. Pt would like the cell phone number tried first if no answer than try the house phone. Please advise.

## 2018-06-20 NOTE — Telephone Encounter (Signed)
Due to current COVID 19 pandemic, our office is severely reducing in office visits for at least the next 2 weeks, in order to minimize the risk to our patients and healthcare providers.   I called pt, offered her a telephone visit with Dr. Rexene Alberts for her sleep consult tomorrow. Pt declined, report that she just had surgery on her foot and does not feel up to It. Pt asked for the appt to be delayed until 07/04/2018. This appt was moved per pt request and she understands that she will get a phone call from me to discuss her appt the day before. Pt verbalized understanding of new appt date and time.

## 2018-06-21 ENCOUNTER — Institutional Professional Consult (permissible substitution): Payer: Medicare Other | Admitting: Neurology

## 2018-06-22 ENCOUNTER — Other Ambulatory Visit: Payer: Self-pay

## 2018-06-22 ENCOUNTER — Other Ambulatory Visit: Payer: Self-pay | Admitting: *Deleted

## 2018-06-22 NOTE — Patient Outreach (Signed)
[facility]) Care Management  06/22/2018  [name] Oct 02, 1940 [id number]     Telephone assessment  Case closure    Patient PMHx includes not not limited to : Chronic Diastolic heart failure, Diabetes, CKA, Hypertension, gout, mild cognitive disorder. Right breast CA,  Patient with recent admission to [facility] on 11/27 for right subtalar and talonavicular fusion  Noted readmission 11/29- 12/5 for sepsis, right severe foot pain , hypotension .  Patient discharged from [facility] on 12/24.  Cardiology : Dr . Ellyn Hack  Nephrology: Dr.Patel Endocrinology: Dr. Bubba Camp Orthopedic; Dr.Duda Neurologist: Dr. Rexene Alberts   Outreach call to patient , she discussed doing much better.   Patient discussed further regarding her conditions .  Right foot ankle fusion surgery/left knee pain Patient has completed home health PT, she continues to work on home exercise plan. Improvement in pain control. Patient reports  discomfort in left knee and recently changed from allopurinal to uloric.  Patient following up with Dr.Duda as recommended.  She discussed drinking whey protein shake daily as recommended, she did check with [name] , nephrologist before starting this.  Fall risk Patient denies recent falls, using rolling walker. Has family support with taking a shower, her grandaughter that lives with them as well her spouse .  Diabetes Patient reports blood sugars are running wonderful continues monitoring blood sugars twice daily, ranging 80 to 150, has not required sliding scale insulin. She continues to focus on diet, balanced meals.she reports  Recent A1c 6, noted 7.1 on 11/29.  Patient follows up with Dr. Bubba Camp as scheduled . Heart Failure  Patient is now able to balance on scales for daily weights, husband continues to help with keeping a written record.  Today's weight is 172, this is in range of usual weights of 173 to 175. Patient able to identify worsening symptoms of  sudden weight gain, of 2 pounds in a day , 5 in a week shortness of breath or increase swelling reports being in green zone. Patient continues to limit fluids to less than 2 liters a day and limiting salt to less than 2 grams. Patient spouse continues to monitor blood pressures daily.   Reports next visit with [name] is in about 7 months and they contact her when due.  Chronic kidney disease Continues follow up with [name] ,she discussed recent improvement in lab results, reports still having stage 4 but improved number. Social  Patient reports having good support at home , with her spouse and granddaugther, they have good food supply, husband/family able to shop . Patient discussed having all medicines available.  Appointments Patient communicating with medical team and has changed some routine follow up visits such as with urologist to later and plans to have some visits by telephone . She as rescheduled planned sleep study by neurology.   Patient has decided to continue with up with Dr. Gerarda Fraction for now instead of seeking PCP closer to her home, she has new patient paperwork to complete for Primary care at [facility] but will hold off for now.   Discussed patient progress over the last 3 months patient care goal met. Patient denies new concerns at this time, discussed [facility] telephonic health coach program as next step for continued management of chronic conditions, patient agreeable. Patient has [facility],and my  contact number is new concerns arise sooner.   Plan Will close to [facility] complex care management and transition to health coach for disease management of chronic condition of heart  failure.  Will send PCP discipline closure letter .     [name], RN, [place] Management Coordinator  [phone]- Mobile [phone]- Toll Free Main Office

## 2018-06-23 ENCOUNTER — Other Ambulatory Visit: Payer: Self-pay | Admitting: *Deleted

## 2018-06-27 ENCOUNTER — Telehealth (INDEPENDENT_AMBULATORY_CARE_PROVIDER_SITE_OTHER): Payer: Self-pay

## 2018-06-27 DIAGNOSIS — Z6830 Body mass index (BMI) 30.0-30.9, adult: Secondary | ICD-10-CM | POA: Diagnosis not present

## 2018-06-27 DIAGNOSIS — E039 Hypothyroidism, unspecified: Secondary | ICD-10-CM | POA: Diagnosis not present

## 2018-06-27 DIAGNOSIS — E119 Type 2 diabetes mellitus without complications: Secondary | ICD-10-CM | POA: Diagnosis not present

## 2018-06-27 DIAGNOSIS — R600 Localized edema: Secondary | ICD-10-CM | POA: Diagnosis not present

## 2018-06-27 DIAGNOSIS — E063 Autoimmune thyroiditis: Secondary | ICD-10-CM | POA: Diagnosis not present

## 2018-06-27 DIAGNOSIS — I10 Essential (primary) hypertension: Secondary | ICD-10-CM | POA: Diagnosis not present

## 2018-06-27 DIAGNOSIS — N189 Chronic kidney disease, unspecified: Secondary | ICD-10-CM | POA: Diagnosis not present

## 2018-06-27 NOTE — Telephone Encounter (Signed)
I called and sw pt and she states that she did go to [facility] today for an appt. She states that she did not go into the office that the doctor came to her car. NO to all other questions.

## 2018-06-28 ENCOUNTER — Ambulatory Visit (INDEPENDENT_AMBULATORY_CARE_PROVIDER_SITE_OTHER): Payer: Medicare Other | Admitting: Family

## 2018-06-28 ENCOUNTER — Encounter (INDEPENDENT_AMBULATORY_CARE_PROVIDER_SITE_OTHER): Payer: Self-pay | Admitting: Family

## 2018-06-28 ENCOUNTER — Other Ambulatory Visit: Payer: Self-pay

## 2018-06-28 ENCOUNTER — Ambulatory Visit (INDEPENDENT_AMBULATORY_CARE_PROVIDER_SITE_OTHER): Payer: Self-pay

## 2018-06-28 VITALS — Ht 66.0 in | Wt 182.0 lb

## 2018-06-28 DIAGNOSIS — M79672 Pain in left foot: Secondary | ICD-10-CM | POA: Diagnosis not present

## 2018-06-28 DIAGNOSIS — M76822 Posterior tibial tendinitis, left leg: Secondary | ICD-10-CM | POA: Diagnosis not present

## 2018-06-28 NOTE — Progress Notes (Signed)
Office Visit Note   Patient: [name]           Date of Birth: [birth year]           MRN: [id number] Visit Date: 06/28/2018              Requested by: [address], [place], [place] [address] PCP: [facility], MD  Chief Complaint  Patient presents with  . Left Foot - Pain      HPI: The patient is a 78 year old woman who presents today complaining of left foot pain.  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  She is concerned about the same thing has happened to her right foot.  She is status post right subtalar and talonavicular fusion November 27 of last year.  She is ambulating with a walker today in regular shoewear.  She does have custom orthotics.  Complains of intermittent swelling concerned that her right foot continues to evert further and is looking deformed.  However she says this is not painful.  The pain to her left foot has been gradual predominantly at the base of the first metatarsal has a callus on the plantar surface this is tender.  She states she typically is barefoot in the home however she knows that she should be wearing arch supports around-the-clock.  Me her care is complicated by a complex medical history.  She has stage IV chronic kidney disease, double breast cancer, congestive heart failure with lower extremity edema right worse than left.  Generalized weakness knee pain she is status post left total knee replacement.  States her left knee goes out from time to time weakness in her left quad has had several falls related to this.  Assessment & Plan: Visit Diagnoses:  1. Posterior tibial tendonitis, left   2. Pain in left foot     Plan:   Follow-Up Instructions: Return in about 29 days (around 07/27/2018).   Ortho Exam  Patient is alert, oriented, no adenopathy, well-dressed, normal affect, normal respiratory effort. On examination of the left foot she has 1+ pitting edema to the left lower  extremity no erythema.  There is callus buildup beneath the base of the first metatarsal there is no open ulcer no erythema.  No sign of infection.  She is unable to perform a single limb heel raise on the left.  Tender to medial cuniform and base of first. Imaging: No results found. No images are attached to the encounter.  Labs: Lab Results  Component Value Date   HGBA1C 7.1 (H) 02/24/2018   HGBA1C 7.1 (H) 02/22/2018   HGBA1C 5.7 (H) 12/07/2016   LABURIC 7.3 (H) 05/04/2018   LABURIC 9.0 (H) 02/25/2018   LABURIC 10.2 (H) 02/16/2018   REPTSTATUS 02/26/2018 FINAL 02/25/2018   CULT  02/25/2018    NO GROWTH Performed at [facility], [place] [address], [place], [place] [address]    LABORGA ESCHERICHIA COLI 12/31/2009     Lab Results  Component Value Date   ALBUMIN 2.3 (L) 03/01/2018   ALBUMIN 2.3 (L) 02/27/2018   ALBUMIN 2.7 (L) 02/25/2018   LABURIC 7.3 (H) 05/04/2018   LABURIC 9.0 (H) 02/25/2018   LABURIC 10.2 (H) 02/16/2018    Body mass index is 29.38 kg/m.  Orders:  Orders Placed This Encounter  Procedures  . XR Foot Complete Left   No orders of the defined types were placed in this encounter.  Procedures: No procedures performed  Clinical Data: No additional findings.  ROS:  All other systems negative, except as noted in the HPI. Review of Systems  Constitutional: Negative for chills and fever.  Musculoskeletal: Positive for arthralgias, gait problem and joint swelling.    Objective: Vital Signs: Ht _0  (1.676 m)   Wt 182 lb (82.6 kg)   BMI 29.38 kg/m   Specialty Comments:  No specialty comments available.  PMFS History: Patient Active Problem List   Diagnosis Date Noted  . Somnolence, daytime 04/06/2018  . Acute on chronic diastolic CHF (congestive heart failure) ([place]) 02/28/2018  . SOB (shortness of breath)   . Hypotension   . Elevated troponin 02/25/2018  . Hypomagnesemia 02/25/2018  . Hypokalemia 02/24/2018  . Urinary retention  02/24/2018  . Sepsis ([place]) 02/24/2018  . Posterior tibial tendinitis of right leg 02/22/2018  . Posterior tibial tendinitis, right leg   . Right atrial mass   . Chronic diastolic heart failure ([place]) 08/01/2017  . Genetic testing 12/03/2016  . 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 Malignant neoplasm of upper-outer quadrant of left breast in female, estrogen receptor positive ([place]) 11/19/2016  . Chronic idiopathic gout involving toe of left foot without tophus 11/15/2016  . Malignant 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, estrogen receptor positive ([place]) 11/09/2016  . Tendon tear, ankle, left, sequela   . Traumatic rupture of left anterior tibial tendon 04/20/2016  . Acute kidney injury superimposed on CKD ([place])   . 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 ([place])   . Headache, migraine   . Unexplained weight loss 11/17/2015  . 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 Restless legs syndrome 07/24/2015  . Mild cognitive impairment 01/08/2015  . Abnormality of gait 01/08/2015  . Dizziness 12/19/2014  . Preoperative cardiovascular examination 12/11/2013  . GERD (gastroesophageal reflux disease) 10/06/2013  . Syncope 10/05/2013  . Anxiety 10/28/2012    Class: Acute  . Bilateral lower extremity edema 10/28/2012  . CAD S/P percutaneous coronary angioplasty   . Essential hypertension   . Dyslipidemia, goal LDL below 70    Past Medical History:  Diagnosis Date  . Anemia    have received iron infusions  . Anxiety   . Arthritis     osteoarthritis  . Breast cancer ([place])   . Breast cancer in female [facility])    Bilateral  . CAD S/P percutaneous coronary angioplasty 2007; March 2011   [employer] 3.0 mm 20 mm postdilated 3.6 mm in early mid LAD; status post ISR Cutting Balloon PTCA and March 11 along with PCI of distal mid lesion with a 3.0 mm 12 mm MultiLink vision BMS; the proximal stent causes jailing of SP1 and SP2 with  ostial 70-80% lesions  . Cancer (HCC)    Melanoma, Squamous cell Carcinoma  . CHF (congestive heart failure) ([place])   . Chronic kidney disease    stage 2   . 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 Dementia ([place])    mild  . Diabetes mellitus type 2 with neurological manifestations ([place])   . Dyslipidemia, goal LDL below 70   . Full dentures   . Genetic testing 12/03/2016   Germline genetic testing was performed through [facility] Common Hereditary Cancers Panel + [facility]'s Melanoma Panel. This custom panel includes analysis of the following 51 genes: APC, ATM, AXIN2, BAP1, BARD1, BMPR1A, BRCA1, BRCA2, BRIP1, CDH1, CDK4, CDKN2A, CHEK2, CTNNA1, DICER1, EPCAM,  GREM1, HOXB13, KIT, MEN1, MITF, MLH1, MSH2, MSH3, MSH6, MUTYH, NBN, NF1, NTHL1, PALB2, PDGFRA, PMS2, POLD1, POL  . GERD (gastroesophageal reflux disease)   . Headache(784.0)   . History of hematuria    Followed by Dr. Gaynelle Arabian  . Hypertension, essential, benign   . Nausea & vomiting 10/2015  . Peripheral neuropathy   . Stroke ([place])   . TIA (transient ischemic attack)    multiple in the past  . Wears glasses     Family History  Problem Relation Age of Onset  . Diabetes Mother   . Hypertension Mother   . Kidney disease Mother   . Hypertension Father   . Emphysema Father   . Heart attack Father   . Heart disease Father   . Arthritis Sister   . Diabetes Sister   . Hypertension Sister   . Breast cancer Sister 59       treated with mastectomy  . Cervical cancer Sister 74  . Hypertension Brother   . Hyperlipidemia Brother   . Diabetes Brother   . Stroke Brother   . Diabetes Sister   . Hypertension Sister   . Stomach cancer Sister 40  . Hypertension Brother   . ALS Brother        d.62s  . Breast cancer Daughter 94       triple negative treated with lumpectomy, chemo, radiation  . Heart attack Daughter 65       d.45  . COPD Daughter   . Cancer Paternal Aunt        unspecified type  .  Cancer Paternal Uncle        unspecified type  . Cancer Paternal Uncle        unspecified type    Past Surgical History:  Procedure Laterality Date  . ABDOMINAL HYSTERECTOMY    . ANKLE FUSION Right 02/22/2018   Procedure: RIGHT SUBTALAR AND TALONAVICULAR FUSION;  Surgeon: Newt Minion, MD;  Location: [facility];  Service: Orthopedics;  Laterality: Right;  . APPENDECTOMY    . BLADDER SUSPENSION    . BREAST LUMPECTOMY Bilateral 2018  . BREAST LUMPECTOMY WITH RADIOACTIVE SEED AND SENTINEL LYMPH NODE BIOPSY Bilateral 12/14/2016   Procedure: BILATERAL BREAST LUMPECTOMY WITH BILATERAL  RADIOACTIVE SEED AND BILATERAL AXILLARY  SENTINEL LYMPH NODE BIOPSY ERAS PATHWAY;  Surgeon: Alphonsa Overall, MD;  Location: [facility];  Service: General;  Laterality: Bilateral;  PECTORAL BLOCK  . CARDIAC CATHETERIZATION  02/24/2006   85% stenosis in the proximal portion of LAD-arrangements made for PCI on 02/25/2006  . CARDIAC CATHETERIZATION  02/25/2006   80% LAD lesion stented with a 3x42m Liberte stent resulting in reduction of 80% lesion to 0% residual  . CARDIAC CATHETERIZATION  04/26/2006   Medical management  . CARDIAC CATHETERIZATION  06/24/2009   60-70% re-stenosis in the proximal LAD. A 3.25x15 cutting balloon, 3 inflations - 14atm-38sec, 13atm-39sec, and 12atm-40sec reduced to less than 10%. 60% stenosis of the mid/distal LAD stented with a 3x117mMultilink stent.  . Marland KitchenARDIAC CATHETERIZATION  07/09/2009   Medical management  . CAROTID DOPPLER  06/24/2009   40-59% R ICA stenosis. No significant ICA stenosis noted  . CHOLECYSTECTOMY    . DILATION AND CURETTAGE OF UTERUS    . JOINT REPLACEMENT Left   . LESION REMOVAL Right 03/11/2015   Procedure:  EXCISION OF RIGHT PRE TIBIAL LESION;  Surgeon: JaJudeth HornMD;  [facility] [facility] Service: General;  Laterality: Right;  . MASS EXCISION Left 06/10/2014  Procedure: EXCISION LEFT LOWER LEG LESION;  Surgeon: Doreen Salvage, MD;  Location: [facility];  Service: General;  Laterality: Left;  Marland Kitchen MASS EXCISION Left 09/05/2015   Procedure: EXCISION OF LEFT FOREARM  MASS;  Surgeon: Judeth Horn, MD;  Location: [facility];  Service: General;  Laterality: Left;  . NM MYOVIEW LTD  11/16/2011   5 beats a PVC during recovery.  No skin or infarction.  Reached 5 METs, EKG negative for ischemia   . NM MYOVIEW LTD  10/2011   No ischemia or infarction.  Normal EF.  LOW RISK. (Short bursts of 5 beats NSVT during recovery otherwise normal)  . SHOULDER ARTHROSCOPY  10/15  . SHOULDER ARTHROSCOPY  1995   left  . TEE WITHOUT CARDIOVERSION N/A 08/03/2017   Procedure: TRANSESOPHAGEAL ECHOCARDIOGRAM (TEE);  Surgeon: Skeet Latch, MD;  Location: [facility];  Service: Cardiovascular;; EF 60-65% with no wall motion normalities.  No atrial thrombus noted.  Lightly findings consistent with a lipomatous hypertrophy of the atrial septum.   . TENDON REPAIR Left 05/12/2016   Procedure: Left Anterior Tibial Tendon Reconstruction;  Surgeon: Newt Minion, MD;  Location: [facility];  Service: Orthopedics;  Laterality: Left;  . TOTAL KNEE ARTHROPLASTY  2005   left  . TRANSESOPHAGEAL ECHOCARDIOGRAM  08/03/2016   : EF 60-65% no wall motion normalities.  No atrial thrombus.  Lipomatous hypertrophy of the atrial septum.  No mass noted.  . TRANSTHORACIC ECHOCARDIOGRAM  10/2015   EF 55-60 %. Mild LVH. Mild pulmonary hypertension. Normal valves.  . TRANSTHORACIC ECHOCARDIOGRAM  07/2017   Vigorous LV function.  EF 65-70%.  Mild diastolic dysfunction with no RWMA. GR 1 DD.  Mild aortic sclerosis/no stenosis.  Questionable right atrial mass discounted with TEE)   . WRIST ARTHROPLASTY  2010   cancer lt wrist   Social History   Occupational History  . Occupation: retired    Fish farm manager: RETIRED  Tobacco Use  . Smoking status: Never Smoker  . Smokeless tobacco: Never Used  Substance and Sexual Activity  . Alcohol use: No  . Drug use: No  . Sexual activity:  Never

## 2018-06-29 ENCOUNTER — Ambulatory Visit (INDEPENDENT_AMBULATORY_CARE_PROVIDER_SITE_OTHER): Payer: Medicare Other | Admitting: Orthopedic Surgery

## 2018-06-29 LAB — URIC ACID: Uric Acid, Serum: 3.7 mg/dL (ref 2.5–7.0)

## 2018-06-29 NOTE — Telephone Encounter (Signed)
I called pt to discuss her appt on Tuesday. No answer, left a message asking her to call me back.

## 2018-07-03 NOTE — Telephone Encounter (Signed)
I called pt. Telephone visits are not recommended for new patients at this time. I explained this to her. She is agreeable to delaying her appt until 08/31/2018 at 11:00am. Pt verbalized understanding of new appt date and time.

## 2018-07-04 ENCOUNTER — Institutional Professional Consult (permissible substitution): Payer: Self-pay | Admitting: Neurology

## 2018-07-14 ENCOUNTER — Other Ambulatory Visit: Payer: Self-pay | Admitting: Neurology

## 2018-07-17 ENCOUNTER — Other Ambulatory Visit: Payer: Self-pay

## 2018-07-17 MED ORDER — PRAMIPEXOLE DIHYDROCHLORIDE 0.25 MG PO TABS: 0.2500 mg | ORAL_TABLET | ORAL | 1 refills | Status: DC

## 2018-07-24 ENCOUNTER — Telehealth: Payer: Self-pay | Admitting: Nurse Practitioner

## 2018-07-24 ENCOUNTER — Other Ambulatory Visit: Payer: Self-pay

## 2018-07-24 ENCOUNTER — Other Ambulatory Visit: Payer: Self-pay | Admitting: *Deleted

## 2018-07-24 MED ORDER — CEREFOLIN NAC 6-2-600 MG PO TABS: ORAL_TABLET | ORAL | 3 refills | Status: DC

## 2018-07-24 NOTE — Telephone Encounter (Signed)
Pt has called to inform she is in need of a refill on her Methylfol-Methylcob-Acetylcyst (CEREFOLIN NAC) [address] [place]

## 2018-07-24 NOTE — Telephone Encounter (Signed)
Medication sent to brand direct. Pt will have to call brand direct on shipment of medications.

## 2018-07-24 NOTE — Patient Outreach (Signed)
[facility]) Care Management  07/24/2018   [name] 1940/08/30 [id number]  RN Health Coach telephone call to patient.  [employer] compliance verified. Per patient she is doing better. Patient stated that she has a lot of swelling in lower extremities. Per patient she elevates lower extremities on a wedge and she wears compression hose. Per patient she puts them on in the morning but legs still swell. Patient is weighing and taking medications as per ordered.  Patient uses a cane. Per patient she doesn't have to use the walker as much any more. Patient has very good support system. Husband and daughter helps patient.  Patient has agreed to follow up outreach calls.     Current Medications:  Current Outpatient Medications  Medication Sig Dispense Refill  . bumetanide (BUMEX) 2 MG tablet Take 1 tablet (2 mg total) by mouth 2 (two) times daily. 60 tablet 0  . Calcium Carb-Cholecalciferol (CALCIUM 600+D3) 600-800 MG-UNIT TABS Take 1 tablet by mouth daily.    . clopidogrel (PLAVIX) 75 MG tablet TAKE 1 TABLET DAILY (Patient taking differently: Take 75 mg by mouth daily. ) 90 tablet 4  . COD LIVER OIL PO Take 415 mg by mouth daily.    . colchicine 0.6 MG tablet Take 1 tablet (0.6 mg total) by mouth daily. PRN gout flare (Patient taking differently: Take 0.6 mg by mouth daily as needed (for foot pain/gout flare up x 3 days then discontinue). ) 90 tablet 3  . Cranberry 500 MG TABS Take 500 mg by mouth every evening.    . diazepam (VALIUM) 5 MG tablet Take 1 tablet (5 mg total) by mouth 2 (two) times daily as needed for anxiety. 10 tablet 0  . exemestane (AROMASIN) 25 MG tablet TAKE 1 TABLET DAILY AFTER BREAKFAST (Patient taking differently: Take 25 mg by mouth daily. ) 90 tablet 4  . febuxostat (ULORIC) 40 MG tablet Take 1 tablet (40 mg total) by mouth daily. 30 tablet 3  . gabapentin (NEURONTIN) 300 MG capsule Take 300-600 mg by mouth daily as needed (nerve pain).    Marland Kitchen gabapentin  (NEURONTIN) 300 MG capsule Take 1 capsule (300 mg total) by mouth 2 (two) times daily. Take 1 during the day if needed and 2 at night 270 capsule 1  . insulin NPH Human (HUMULIN N,NOVOLIN N) 100 UNIT/ML injection Inject 20-25 Units into the skin See admin instructions. Inject 20 units in the morning & inject 25 units after supper.    Javier Docker Oil 500 MG CAPS Take 500 mg by mouth daily.    . Methylfol-Methylcob-Acetylcyst (CEREFOLIN NAC) 6-2-600 MG TABS TAKE 1 CAPLET BY MOUTH ONCE DAILY. 90 each 3  . metolazone (ZAROXOLYN) 2.5 MG tablet Take 2.5 mg by mouth daily. Taking on Monday and Thursday 30 minutes before bumex.    . metoprolol succinate (TOPROL-XL) 25 MG 24 hr tablet Take 0.5 tablets (12.5 mg total) by mouth every evening. 30 tablet 0  . midodrine (PROAMATINE) 5 MG tablet Take 1 tablet (5 mg total) by mouth 3 (three) times daily with meals. 90 tablet 0  . mirtazapine (REMERON) 15 MG tablet Take 15 mg by mouth at bedtime. Taking 1 1/2 tablet at bedtime    . nitroGLYCERIN (NITROSTAT) 0.4 MG SL tablet Place 0.4 mg under the tongue every 5 (five) minutes as needed for chest pain.    Marland Kitchen oxyCODONE-acetaminophen (PERCOCET/ROXICET) 5-325 MG tablet Take 1 tablet by mouth every 6 (six) hours as needed for severe pain. [place]  tablet 0  . pantoprazole (PROTONIX) 40 MG tablet TAKE 1 TABLET DAILY (CONTACT OFFICE FOR ADDITIONAL REFILLS, FINAL ATTEMPT) (Patient taking differently: Take 40 mg by mouth daily before breakfast. ) 90 tablet 0  . potassium chloride SA (K-DUR,KLOR-CON) 20 MEQ tablet Take 40 mEq by mouth 2 (two) times daily.     . pramipexole (MIRAPEX) 0.25 MG tablet Take 1-2 tablets (0.25-0.5 mg total) by mouth See admin instructions. TAKE 1 TABLET AT 4:00 P.M. AND 2 TABLETS (0.5 MG) AT BEDTIME 270 tablet 1  . rosuvastatin (CRESTOR) 20 MG tablet Take 20 mg by mouth daily with supper.     . sennosides-docusate sodium (SENOKOT-S) 8.6-50 MG tablet Take 1 tablet by mouth daily as needed for constipation.    Marland Kitchen  Ubiquinol (QUNOL COQ10/UBIQUINOL/MEGA) 100 MG CAPS Take 100 mg by mouth every evening. CoQ10, Qunol      No current facility-administered medications for this visit.     Functional Status:  In your present state of health, do you have any difficulty performing the following activities: 07/24/2018 03/31/2018  Hearing? N N  Vision? N N  Difficulty concentrating or making decisions? N N  Walking or climbing stairs? Y Y  Comment gait unsteady/ patient has neuropathy no weight bearing right leg  Dressing or bathing? [name]  Comment daughter assists with showering daughter assist with shower  Doing errands, shopping? Y Y  Comment Husband and daughters does all  errands husband Diplomatic Services operational officer and eating ? Y Y  Comment husbamd assists husband prepares meals  Using the Toilet? N N  In the past six months, have you accidently leaked urine? N N  Do you have problems with loss of bowel control? N N  Managing your Medications? Y Y  Comment Husband sets up medications husband assist   Managing your Finances? Y Y  Comment Husband takes care of husband assist   Housekeeping or managing your Housekeeping? Y Y  Comment Husband assists husband assist   Some recent data might be hidden    Fall/Depression Screening: Fall Risk  07/24/2018 04/06/2018 03/31/2018  Falls in the past year? 0 1 1  Number falls in past yr: - 1 0  Injury with Fall? - 1 1  Risk for fall due to : Impaired balance/gait;Impaired mobility Impaired mobility;Impaired balance/gait History of fall(s);Impaired balance/gait;Impaired mobility  Follow up - - Falls evaluation completed;Falls prevention discussed   PHQ 2/9 Scores 07/24/2018 03/31/2018 08/23/2017 03/21/2017  PHQ - 2 Score 0 0 1 1   THN CM Care Plan Problem One     Most Recent Value  Care Plan Problem One  Knowledge Deficit in Self Management of Congestive Heart Failure  Role Documenting the Problem One  [place] for Problem One  Active  THN Long Term Goal    Patient will notr have any readmissions within the next 90 days due to Congestive Heart Failure  THN Long Term Goal Start Date  07/24/18  Interventions for Problem One Long Term Goal  RN discussed Congestive Heart Failure and action plan. RN reiterated wearing compreesion hose. RN reiterated medication adherence. RN will follow up with further discussion .  THN CM Short Term Goal #1   Patient will have verbalize CHF exacerbation awareness within the next 30 days  THN CM Short Term Goal #1 Start Date  07/24/18  Interventions for Short Term Goal #1  RN discussed chf Symptoms.RN sent educational material on CHF exacerbation . RN will follow up  with further discussion  THN CM Short Term Goal #2   Patient will follow up on all maintenance appointments within the next 30 days  THN CM Short Term Goal #2 Start Date  07/24/18  Interventions for Short Term Goal #2  RN discussed appointments that have been cancelled to reschedule or follow up. RN will follow up for further discussion.      Assessment:  Patient is weighing and documenting Patient Fasting blood sugar is 93 Patient is monitoring sodium intake Patient has swelling in lower extremities Patient is elevating extremities Patient does have some balance issues but no recent falls  Plan:  RN discussed CHF action plan and zones RN sent patient Glucerna coupons RN sent patient educational material on CHF exacerbation RN sent barriers letter and assessment to physician RN will follow up within the month of July  Pascual Mantel [place] [facility] [phone]

## 2018-07-26 ENCOUNTER — Ambulatory Visit (INDEPENDENT_AMBULATORY_CARE_PROVIDER_SITE_OTHER): Payer: Medicare Other | Admitting: Family

## 2018-07-27 ENCOUNTER — Ambulatory Visit (INDEPENDENT_AMBULATORY_CARE_PROVIDER_SITE_OTHER): Payer: Medicare Other | Admitting: Orthopedic Surgery

## 2018-08-03 ENCOUNTER — Other Ambulatory Visit: Payer: Self-pay

## 2018-08-03 ENCOUNTER — Encounter: Payer: Self-pay | Admitting: Orthopedic Surgery

## 2018-08-03 ENCOUNTER — Ambulatory Visit (INDEPENDENT_AMBULATORY_CARE_PROVIDER_SITE_OTHER): Payer: Medicare Other | Admitting: Orthopedic Surgery

## 2018-08-03 VITALS — Ht 66.0 in | Wt 182.0 lb

## 2018-08-03 DIAGNOSIS — M76822 Posterior tibial tendinitis, left leg: Secondary | ICD-10-CM

## 2018-08-03 DIAGNOSIS — R2242 Localized swelling, mass and lump, left lower limb: Secondary | ICD-10-CM | POA: Diagnosis not present

## 2018-08-03 DIAGNOSIS — M79672 Pain in left foot: Secondary | ICD-10-CM

## 2018-08-03 DIAGNOSIS — M25571 Pain in right ankle and joints of right foot: Secondary | ICD-10-CM | POA: Diagnosis not present

## 2018-08-03 NOTE — Progress Notes (Signed)
Office Visit Note   Patient: [name]           Date of Birth: 14-Aug-1940           MRN: [id number] Visit Date: 08/03/2018              Requested by: Redmond School, [place], [place] [id number] PCP: Redmond School, MD  Chief Complaint  Patient presents with   Left Foot - Follow-up   Right Foot - Follow-up    02/22/18 subtalar and talonavicular fusion       HPI: Patient is a 78 year old woman who presents for evaluation of both feet she is status post subtalar talonavicular fusion on the right in 2019.  Patient feels like the toes are turning out.  She is also concerned that she may have increasing deformity of the left lower extremity.  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  Patient states she has to manually lift her left leg to bring it to centerline.  Assessment & Plan: Visit Diagnoses:  1. Pain in left foot   2. Posterior tibial tendonitis, left   3. Pain in right ankle and joints of right foot     Plan: Patient was given a prescription for physical therapy to help with her lower extremity strength and improve her abductor strength on the left.  Recommended that she follow-up with dermatology for excision of the soft tissue mass on the left leg.  Patient states that she has had other masses that were positive for cancer.  Recommended that she continue her compression stockings.  Follow-Up Instructions: Return if symptoms worsen or fail to improve.   Ortho Exam  Patient is alert, oriented, no adenopathy, well-dressed, normal affect, normal respiratory effort. On examination patient has weakness with hip abduction on the left.  She has a negative straight leg raise and no weakness with hip flexion which is symmetric bilaterally no weakness in plantarflexion or dorsiflexion bilaterally.  Patient does have venous stasis changes but no venous ulcers  she does have a soft tissue ulcer over the anterior aspect of the left leg which is concerning for squamous cell CA.  Patient states she has had other lesions which were positive for skin cancer.  Patient's both feet are stable with no increasing pronation or valgus in either foot her feet are plantigrade.  Imaging: No results found. No images are attached to the encounter.  Labs: Lab Results  Component Value Date   HGBA1C 7.1 (H) 02/24/2018   HGBA1C 7.1 (H) 02/22/2018   HGBA1C 5.7 (H) 12/07/2016   LABURIC 3.7 06/28/2018   LABURIC 7.3 (H) 05/04/2018   LABURIC 9.0 (H) 02/25/2018   REPTSTATUS 02/26/2018 FINAL 02/25/2018   CULT  02/25/2018    NO GROWTH Performed at [facility], [place] [address], [place], [place] [address]    LABORGA ESCHERICHIA COLI 12/31/2009     Lab Results  Component Value Date   ALBUMIN 2.3 (L) 03/01/2018   ALBUMIN 2.3 (L) 02/27/2018   ALBUMIN 2.7 (L) 02/25/2018   LABURIC 3.7 06/28/2018   LABURIC 7.3 (H) 05/04/2018   LABURIC 9.0 (H) 02/25/2018    Body mass index is 29.38 kg/m.  Orders:  No orders of the defined types were placed in this encounter.  No orders of the defined types were placed in this encounter.    Procedures: No procedures performed  Clinical Data: No additional findings.  ROS:  All other systems negative, except as noted in the HPI. Review of Systems  Objective: Vital Signs: Ht '5\' 6"'$  (1.676 m)    Wt 182 lb (82.6 kg)    BMI 29.38 kg/m   Specialty Comments:  No specialty comments available.  PMFS History: Patient Active Problem List   Diagnosis Date Noted   Somnolence, daytime 04/06/2018   Acute on chronic diastolic CHF (congestive heart failure) ([place]) 02/28/2018   SOB (shortness of breath)    Hypotension    Elevated troponin 02/25/2018   Hypomagnesemia 02/25/2018   Hypokalemia 02/24/2018   Urinary retention 02/24/2018   Sepsis ([place]) 02/24/2018   Posterior tibial tendinitis of right leg 02/22/2018     Posterior tibial tendinitis, right leg    Right atrial mass    Chronic diastolic heart failure ([place]) 08/01/2017   Genetic testing 12/03/2016   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   Malignant neoplasm of upper-outer quadrant of left breast in female, estrogen receptor positive ([place]) 11/19/2016   Chronic idiopathic gout involving toe of left foot without tophus 11/15/2016   Malignant 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, estrogen receptor positive ([place]) 11/09/2016   Tendon tear, ankle, left, sequela    Traumatic rupture of left anterior tibial tendon 04/20/2016   Acute kidney injury superimposed on CKD ([place])    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 (HCC)    Headache, migraine    Unexplained weight loss 11/17/2015   CKD (chronic kidney disease) stage 4, GFR 15-29 ml/min (HCC) 11/17/2015   Restless legs syndrome 07/24/2015   Mild cognitive impairment 01/08/2015   Abnormality of gait 01/08/2015   Dizziness 12/19/2014   Preoperative cardiovascular examination 12/11/2013   GERD (gastroesophageal reflux disease) 10/06/2013   Syncope 10/05/2013   Anxiety 10/28/2012    Class: Acute   Bilateral lower extremity edema 10/28/2012   CAD S/P percutaneous coronary angioplasty    Essential hypertension    Dyslipidemia, goal LDL below 70    Past Medical History:  Diagnosis Date   Anemia    have received iron infusions   Anxiety    Arthritis     osteoarthritis   Breast cancer ([place])    Breast cancer in female [facility])    Bilateral   CAD S/P percutaneous coronary angioplasty 2007; March 2011   [employer] 3.0 mm 20 mm postdilated 3.6 mm in early mid LAD; status post ISR Cutting Balloon PTCA and March 11 along with PCI of distal mid lesion with a 3.0 mm 12 mm MultiLink vision BMS; the proximal stent causes jailing of SP1 and SP2 with ostial 70-80% lesions   Cancer ([place])    Melanoma, Squamous cell Carcinoma   CHF (congestive  heart failure) (HCC)    Chronic kidney disease    stage 2    CKD (chronic kidney disease) stage 4, GFR 15-29 ml/min ([place]) 11/17/2015   Dementia ([place])    mild   Diabetes mellitus type 2 with neurological manifestations ([place])    Dyslipidemia, goal LDL below 70    Full dentures    Genetic testing 12/03/2016   Germline genetic testing was performed through [employer]'s Common Hereditary Cancers Panel + [employer]'s Melanoma Panel. This custom panel includes analysis of the following 51 genes: APC, ATM, AXIN2, BAP1, BARD1, BMPR1A, BRCA1, BRCA2, BRIP1, CDH1, CDK4, CDKN2A, CHEK2, CTNNA1, DICER1, EPCAM, GREM1, HOXB13, KIT, MEN1, MITF, MLH1, MSH2, MSH3, MSH6, MUTYH, NBN, NF1, NTHL1, PALB2, PDGFRA, PMS2, POLD1, POL   GERD (  gastroesophageal reflux disease)    Headache(784.0)    History of hematuria    Followed by Dr. Gaynelle Arabian   Hypertension, essential, benign    Nausea & vomiting 10/2015   Peripheral neuropathy    Stroke ([place])    TIA (transient ischemic attack)    multiple in the past   Wears glasses     Family History  Problem Relation Age of Onset   Diabetes Mother    Hypertension Mother    Kidney disease Mother    Hypertension Father    Emphysema Father    Heart attack Father    Heart disease Father    Arthritis Sister    Diabetes Sister    Hypertension Sister    Breast cancer Sister 72       treated with mastectomy   Cervical cancer Sister 4   Hypertension Brother    Hyperlipidemia Brother    Diabetes Brother    Stroke Brother    Diabetes Sister    Hypertension Sister    Stomach cancer Sister 72   Hypertension Brother    ALS Brother        d.62s   Breast cancer Daughter 30       triple negative treated with lumpectomy, chemo, radiation   Heart attack Daughter 77       d.45   COPD Daughter    Cancer Paternal Aunt        unspecified type   Cancer Paternal Uncle        unspecified type   Cancer Paternal Uncle        unspecified type     Past Surgical History:  Procedure Laterality Date   ABDOMINAL HYSTERECTOMY     ANKLE FUSION Right 02/22/2018   Procedure: RIGHT SUBTALAR AND TALONAVICULAR FUSION;  Surgeon: Newt Minion, MD;  Location: [place];  Service: Orthopedics;  Laterality: Right;   APPENDECTOMY     BLADDER SUSPENSION     BREAST LUMPECTOMY Bilateral 2018   BREAST LUMPECTOMY WITH RADIOACTIVE SEED AND SENTINEL LYMPH NODE BIOPSY Bilateral 12/14/2016   Procedure: BILATERAL BREAST LUMPECTOMY WITH BILATERAL  RADIOACTIVE SEED AND BILATERAL AXILLARY  SENTINEL LYMPH NODE BIOPSY ERAS PATHWAY;  Surgeon: Alphonsa Overall, MD;  Location: [place];  Service: General;  Laterality: Bilateral;  [place]  02/24/2006   85% stenosis in the proximal portion of LAD-arrangements made for PCI on 02/25/2006   CARDIAC CATHETERIZATION  02/25/2006   80% LAD lesion stented with a 3x40m [employer]te stent resulting in reduction of 80% lesion to 0% residual   CARDIAC CATHETERIZATION  04/26/2006   Medical management   CARDIAC CATHETERIZATION  06/24/2009   60-70% re-stenosis in the proximal LAD. A 3.25x15 cutting balloon, 3 inflations - 14atm-38sec, 13atm-39sec, and 12atm-40sec reduced to less than 10%. 60% stenosis of the mid/distal LAD stented with a 3x12mMultilink stent.   CARDIAC CATHETERIZATION  07/09/2009   Medical management   CAROTID DOPPLER  06/24/2009   40-59% R ICA stenosis. No significant ICA stenosis noted   CHOLECYSTECTOMY     DILATION AND CURETTAGE OF UTERUS     JOINT REPLACEMENT Left    LESION REMOVAL Right 03/11/2015   Procedure:  EXCISION OF RIGHT PRE TIBIAL LESION;  Surgeon: JaJudeth HornMD;  [place] Service: General;  Laterality: Right;   MASS EXCISION Left 06/10/2014   Procedure: EXCISION LEFT LOWER LEG LESION;  Surgeon: JaDoreen SalvageMD;  [place] Service:  General;  Laterality: Left;   MASS EXCISION Left 09/05/2015   Procedure:  EXCISION OF LEFT FOREARM  MASS;  Surgeon: Judeth Horn, MD;  Location: [place];  Service: General;  Laterality: Left;   NM MYOVIEW LTD  11/16/2011   5 beats a PVC during recovery.  No skin or infarction.  Reached 5 METs, EKG negative for ischemia    NM MYOVIEW LTD  10/2011   No ischemia or infarction.  Normal EF.  LOW RISK. (Short bursts of 5 beats NSVT during recovery otherwise normal)   SHOULDER ARTHROSCOPY  10/15   SHOULDER ARTHROSCOPY  1995   left   TEE WITHOUT CARDIOVERSION N/A 08/03/2017   Procedure: TRANSESOPHAGEAL ECHOCARDIOGRAM (TEE);  Surgeon: Skeet Latch, MD;  Location: [place];  Service: Cardiovascular;; EF 60-65% with no wall motion normalities.  No atrial thrombus noted.  Lightly findings consistent with a lipomatous hypertrophy of the atrial septum.    TENDON REPAIR Left 05/12/2016   Procedure: Left Anterior Tibial Tendon Reconstruction;  Surgeon: Newt Minion, MD;  Location: [place];  Service: Orthopedics;  Laterality: Left;   TOTAL KNEE ARTHROPLASTY  2005   left   TRANSESOPHAGEAL ECHOCARDIOGRAM  08/03/2016   : EF 60-65% no wall motion normalities.  No atrial thrombus.  Lipomatous hypertrophy of the atrial septum.  No mass noted.   TRANSTHORACIC ECHOCARDIOGRAM  10/2015   EF 55-60 %. Mild LVH. Mild pulmonary hypertension. Normal valves.   TRANSTHORACIC ECHOCARDIOGRAM  07/2017   Vigorous LV function.  EF 65-70%.  Mild diastolic dysfunction with no RWMA. GR 1 DD.  Mild aortic sclerosis/no stenosis.  Questionable right atrial mass discounted with TEE)    WRIST ARTHROPLASTY  2010   cancer lt wrist   Social History   Occupational History   Occupation: retired    Fish farm manager: RETIRED  Tobacco Use   Smoking status: Never Smoker   Smokeless tobacco: Never Used  Substance and Sexual Activity   Alcohol use: No   Drug use: No   Sexual activity: Never

## 2018-08-07 DIAGNOSIS — N312 Flaccid neuropathic bladder, not elsewhere classified: Secondary | ICD-10-CM | POA: Diagnosis not present

## 2018-08-07 DIAGNOSIS — R8279 Other abnormal findings on microbiological examination of urine: Secondary | ICD-10-CM | POA: Diagnosis not present

## 2018-08-07 DIAGNOSIS — N3000 Acute cystitis without hematuria: Secondary | ICD-10-CM | POA: Diagnosis not present

## 2018-08-09 ENCOUNTER — Other Ambulatory Visit: Payer: Self-pay | Admitting: Cardiology

## 2018-08-10 ENCOUNTER — Telehealth: Payer: Self-pay | Admitting: Hematology

## 2018-08-10 NOTE — Telephone Encounter (Signed)
Per sch msg, called patient about 5/18 appt. Changed to phone visit per patients request. Ok to call home or mobile.

## 2018-08-11 NOTE — Progress Notes (Signed)
[name]   [phone] [phone]   Clinic Follow up Note   Patient Care Team: Redmond School, MD as PCP - General (Internal Medicine) Leonie Man, MD as PCP - Cardiology (Cardiology) Elmarie Shiley, MD as Consulting Physician (Nephrology) Leonie Man, MD as Consulting Physician (Cardiology) Alphonsa Overall, MD as Consulting Physician (General Surgery) Kyung Rudd, MD as Consulting Physician (Radiation Oncology) Truitt Merle, MD as Consulting Physician (Hematology) Delice Bison, Charlestine Massed, NP as Nurse Practitioner (Hematology and Oncology) Pleasant, Eppie Gibson, RN as [employer]   I connected with [name] on 08/14/2018 at 11:30 AM EDT by telephone visit and verified that I am speaking with the correct person using two identifiers.  I discussed the limitations, risks, security and privacy concerns of performing an evaluation and management service by telephone and the availability of in person appointments. I also discussed with the patient that there may be a patient responsible charge related to this service. The patient expressed understanding and agreed to proceed.   Patient's location:  Her home  Provider's location:  My Office   CHIEF COMPLAINT: F/u for Bilateral invasive breast cancer  SUMMARY OF ONCOLOGIC HISTORY: Oncology History   Cancer Staging Malignant 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, estrogen receptor positive ([place]) Staging form: Breast, AJCC 8th Edition - Clinical: Stage IA (cT1c, cN0, cM0, G1, ER: Positive, PR: Positive, HER2: Negative) - Unsigned       Malignant 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, estrogen receptor positive ([place])   10/20/2016 Initial Biopsy    Diagnosis Breast, right, needle core biopsy, 11:30 o'clock - INVASIVE DUCTAL CARCINOMA,    10/20/2016 Initial Diagnosis    Malignant neoplasm of upper-inner quadrant of right breast  in female, estrogen receptor positive ([place])    10/20/2016 Receptors her2    Estrogen Receptor: 100%, POSITIVE, STRONG STAINING INTENSITY Progesterone Receptor: 100%, POSITIVE, STRONG STAINING INTENSITY Proliferation Marker Ki67: 5% HER2 NEGATIVE     10/20/2016 Mammogram    Diagnostic mammogram showed persistent distortion corresponds to a vaguely palpable irregular mass and is suspicious for malignancy.Targeted ultrasound is performed, showing an irregular hypoechoic mass with posterior acoustic shadowing in the 11:30 o'clock location of the right breast 4 cm from the nipple which measures 0.9 x 0.9 x0.8 cm. There is associated internal vascularity. Evaluation of the axilla is negative for adenopathy.    11/01/2016 Imaging    B/l BREAST MRI: IMPRESSION: 1. There is a 1.5 cm biopsy proven malignancy in the upper slightly outer quadrant of the right breast. No additional sites of disease are identified.  2. There is an indeterminate 6 mm enhancing mass in the upper-outer left breast.    11/11/2016 Pathology Results    Diagnosis Breast, left, needle core biopsy, upper outer - INVASIVE DUCTAL CARCINOMA. - ATYPICAL LOBULAR HYPERPLASIA. - FIBROCYSTIC CHANGES AND PSEUDOANGIOMATOUS STROMAL HYPERPLASIA ([place]).    11/11/2016 Receptors her2     Estrogen Receptor: 100%, POSITIVE, STRONG STAINING INTENSITY Progesterone Receptor: 10%, POSITIVE, WEAK STAINING INTENSITY Proliferation Marker Ki67: 2% HER2 NEGATIVE     12/03/2016 Genetic Testing    Germline genetic testing was performed through [employer]'s Common Hereditary Cancers Panel + [facility]'s Melanoma Panel. This custom panel includes analysis of the following 51 genes: APC, ATM, AXIN2, BAP1, BARD1, BMPR1A, BRCA1, BRCA2, BRIP1, CDH1, CDK4, CDKN2A, CHEK2, CTNNA1, DICER1, EPCAM, GREM1, HOXB13, KIT, MEN1, MITF, MLH1, MSH2, MSH3, MSH6, MUTYH, NBN, NF1, NTHL1, PALB2, PDGFRA, PMS2, POLD1, POLE, POT1,  PTEN, RAD50, RAD51C, RAD51D, RB1, SDHA, SDHB, SDHC,  SDHD, SMAD4, SMARCA4, STK11, TP53, TSC1, TSC2, and VHL.  A variant of uncertain significance (VUS) was noted in BARD1.     12/14/2016 Miscellaneous    Oncotype Recurrence score of 16 10-year risk of distant recurrence with Tamoxifen alone is 10%    12/14/2016 Surgery    BILATERAL BREAST LUMPECTOMY WITH BILATERAL  RADIOACTIVE SEED AND BILATERAL AXILLARY  SENTINEL LYMPH NODE BIOPSY ERAS PATHWAY by Dr. Lucia Gaskins on 12/14/16    12/14/2016 Pathology Results    Diagnosis 12/14/16 1. Breast, lumpectomy, Left w/seed - INVASIVE DUCTAL CARCINOMA, 1 CM. - ATYPICAL LOBULAR HYPERPLASIA ([place]). - PREVIOUS BIOPSY SITE. - INVASIVE CARCINOMA 0.2 CM FROM LATERAL MARGIN. - ATYPICA LOBULAR HYPERPLASIA LESS THAN 0.1 CM FROM LATERAL MARGIN. 2. Breast, lumpectomy, Right w/seed - INVASIVE DUCTAL CARCINOMA, 1.1 CM. - FIBROCYSTIC CHANGES. - PREVIOUS BIOPSY SITE. - INVASIVE CARCINOMA 1.1 CM FROM POSTERIOR MARGIN. 3. Lymph node, sentinel, biopsy, Right Axillary Contents - BENIGN FIBROADIPOSE TISSUE. - NO LYMPH NODE TISSUE OR MALIGNANCY. 4. Lymph node, sentinel, biopsy, Left Axillary - ONE BENIGN LYMPH NODE (0/1).     01/17/2017 - 02/11/2017 Radiation Therapy    1. The Left breast was treated to 42.5 Gy in 17 fractions of 2.5 Gy.  2. The Right breast was treated to 42.5 Gy in 17 fractions of 2.5 Gy.  3. The Right breast was boosted to 7.5 Gy in 3 fractions of 2.5 Gy.  4. The Left breast was boosted to 7.5 Gy in 3 fractions of 2.5 Gy.        02/2017 -  Anti-estrogen oral therapy    Exemestane daily      CURRENT THERAPY:  Adjuvant Exemestane started on 03/07/17  INTERVAL HISTORY:  [name] is here for a follow up of b/l breast cancer. She was able to identify herself by birth date.  She notes she had right foot surgery in 01/2018 and had sepsis. She went back to hospital for that.  Since then she is having a hard time and not being able to walk. She has been trying to ambulate but her left foot is  developing a spot as well. She saw her orthopedic surgeon who notes posterior tibial tendonitis. She has completed PT already and plans to get added to diabetic association to get diabetic shoes.   She notes she is still taking Exemestane. She notes being dizzy most days and not sure of cause. She held exemestane for 10 days with no change so she restarted. She notes her BP has been dropping. She will have near syncope. She has followed up with cardiologist who notes this is effects on  The heart form radiation treatment. She notes she continues to take her BP medication, '25mg'$  metoprolol.     REVIEW OF SYSTEMS:   Constitutional: Denies fevers, chills or abnormal weight loss (+) occasional dizziness, near syncope  Eyes: Denies blurriness of vision Ears, nose, mouth, throat, and face: Denies mucositis or sore throat Respiratory: Denies cough, dyspnea or wheezes Cardiovascular: Denies palpitation, chest discomfort or lower extremity swelling Gastrointestinal:  Denies nausea, heartburn or change in bowel habits Skin: Denies abnormal skin rashes MKS: (+) trouble ambulating due to right lower leg pain and diabetic foot Lymphatics: Denies new lymphadenopathy or easy bruising Neurological:Denies numbness, tingling or new weaknesses Behavioral/Psych: Mood is stable, no new changes  All other systems were reviewed with the patient and are negative.  MEDICAL HISTORY:  Past Medical History:  Diagnosis Date  .  Anemia    have received iron infusions  . Anxiety   . Arthritis     osteoarthritis  . Breast cancer ([place])   . Breast cancer in female [facility])    Bilateral  . CAD S/P percutaneous coronary angioplasty 2007; March 2011   [facility] 3.0 mm 20 mm postdilated 3.6 mm in early mid LAD; status post ISR Cutting Balloon PTCA and March 11 along with PCI of distal mid lesion with a 3.0 mm 12 mm MultiLink vision BMS; the proximal stent causes jailing of SP1 and SP2 with ostial 70-80% lesions  . Cancer  (HCC)    Melanoma, Squamous cell Carcinoma  . CHF (congestive heart failure) ([place])   . Chronic [name] disease    stage 2   . CKD (chronic [name] disease) stage 4, GFR 15-29 ml/min (HCC) 11/17/2015  . Dementia ([place])    mild  . Diabetes mellitus type 2 with neurological manifestations ([place])   . Dyslipidemia, goal LDL below 70   . Full dentures   . Genetic testing 12/03/2016   Germline genetic testing was performed through [facility]'s Common Hereditary Cancers Panel + [facility]'s Melanoma Panel. This custom panel includes analysis of the following 51 genes: APC, ATM, AXIN2, BAP1, BARD1, BMPR1A, BRCA1, BRCA2, BRIP1, CDH1, CDK4, CDKN2A, CHEK2, CTNNA1, DICER1, EPCAM, GREM1, HOXB13, KIT, MEN1, MITF, MLH1, MSH2, MSH3, MSH6, MUTYH, NBN, NF1, NTHL1, PALB2, PDGFRA, PMS2, POLD1, POL  . GERD (gastroesophageal reflux disease)   . Headache(784.0)   . History of hematuria    Followed by Dr. Gaynelle Arabian  . Hypertension, essential, benign   . Nausea & vomiting 10/2015  . Peripheral neuropathy   . Stroke ([place])   . TIA (transient ischemic attack)    multiple in the past  . Wears glasses     SURGICAL HISTORY: Past Surgical History:  Procedure Laterality Date  . ABDOMINAL HYSTERECTOMY    . ANKLE FUSION Right 02/22/2018   Procedure: RIGHT SUBTALAR AND TALONAVICULAR FUSION;  Surgeon: Newt Minion, MD;  Location: [facility];  Service: Orthopedics;  Laterality: Right;  . APPENDECTOMY    . BLADDER SUSPENSION    . BREAST LUMPECTOMY Bilateral 2018  . BREAST LUMPECTOMY WITH RADIOACTIVE SEED AND SENTINEL LYMPH NODE BIOPSY Bilateral 12/14/2016   Procedure: BILATERAL BREAST LUMPECTOMY WITH BILATERAL  RADIOACTIVE SEED AND BILATERAL AXILLARY  SENTINEL LYMPH NODE BIOPSY ERAS PATHWAY;  Surgeon: Alphonsa Overall, MD;  Location: [facility];  Service: General;  Laterality: Bilateral;  PECTORAL BLOCK  . CARDIAC CATHETERIZATION  02/24/2006   85% stenosis in the proximal portion of LAD-arrangements made for PCI on 02/25/2006  . CARDIAC  CATHETERIZATION  02/25/2006   80% LAD lesion stented with a 3x70m [employer] stent resulting in reduction of 80% lesion to 0% residual  . CARDIAC CATHETERIZATION  04/26/2006   Medical management  . CARDIAC CATHETERIZATION  06/24/2009   60-70% re-stenosis in the proximal LAD. A 3.25x15 cutting balloon, 3 inflations - 14atm-38sec, 13atm-39sec, and 12atm-40sec reduced to less than 10%. 60% stenosis of the mid/distal LAD stented with a 3x126mMultilink stent.  . Marland KitchenARDIAC CATHETERIZATION  07/09/2009   Medical management  . CAROTID DOPPLER  06/24/2009   40-59% R ICA stenosis. No significant ICA stenosis noted  . CHOLECYSTECTOMY    . DILATION AND CURETTAGE OF UTERUS    . JOINT REPLACEMENT Left   . LESION REMOVAL Right 03/11/2015   Procedure:  EXCISION OF RIGHT PRE TIBIAL LESION;  Surgeon: JaJudeth HornMD;  [place] [facility] Service: General;  Laterality: Right;  . MASS EXCISION Left 06/10/2014   Procedure: EXCISION LEFT LOWER LEG LESION;  Surgeon: Doreen Salvage, MD;  Location: [facility];  Service: General;  Laterality: Left;  Marland Kitchen MASS EXCISION Left 09/05/2015   Procedure: EXCISION OF LEFT FOREARM  MASS;  Surgeon: Judeth Horn, MD;  Location: [facility];  Service: General;  Laterality: Left;  . NM MYOVIEW LTD  11/16/2011   5 beats a PVC during recovery.  No skin or infarction.  Reached 5 METs, EKG negative for ischemia   . NM MYOVIEW LTD  10/2011   No ischemia or infarction.  Normal EF.  LOW RISK. (Short bursts of 5 beats NSVT during recovery otherwise normal)  . SHOULDER ARTHROSCOPY  10/15  . SHOULDER ARTHROSCOPY  1995   left  . TEE WITHOUT CARDIOVERSION N/A 08/03/2017   Procedure: TRANSESOPHAGEAL ECHOCARDIOGRAM (TEE);  Surgeon: Skeet Latch, MD;  Location: [facility];  Service: Cardiovascular;; EF 60-65% with no wall motion normalities.  No atrial thrombus noted.  Lightly findings consistent with a lipomatous hypertrophy of the atrial septum.   . TENDON  REPAIR Left 05/12/2016   Procedure: Left Anterior Tibial Tendon Reconstruction;  Surgeon: Newt Minion, MD;  Location: [facility];  Service: Orthopedics;  Laterality: Left;  . TOTAL KNEE ARTHROPLASTY  2005   left  . TRANSESOPHAGEAL ECHOCARDIOGRAM  08/03/2016   : EF 60-65% no wall motion normalities.  No atrial thrombus.  Lipomatous hypertrophy of the atrial septum.  No mass noted.  . TRANSTHORACIC ECHOCARDIOGRAM  10/2015   EF 55-60 %. Mild LVH. Mild pulmonary hypertension. Normal valves.  . TRANSTHORACIC ECHOCARDIOGRAM  07/2017   Vigorous LV function.  EF 65-70%.  Mild diastolic dysfunction with no RWMA. GR 1 DD.  Mild aortic sclerosis/no stenosis.  Questionable right atrial mass discounted with TEE)   . WRIST ARTHROPLASTY  2010   cancer lt wrist    I have reviewed the social history and family history with the patient and they are unchanged from previous note.  ALLERGIES:  is allergic to ultram [tramadol]; lipitor [atorvastatin]; lyrica [pregabalin]; reglan [metoclopramide]; and nsaids.  MEDICATIONS:  Current Outpatient Medications  Medication Sig Dispense Refill  . bumetanide (BUMEX) 2 MG tablet Take 1 tablet (2 mg total) by mouth 2 (two) times daily. 60 tablet 0  . Calcium Carb-Cholecalciferol (CALCIUM 600+D3) 600-800 MG-UNIT TABS Take 1 tablet by mouth daily.    . clopidogrel (PLAVIX) 75 MG tablet TAKE 1 TABLET DAILY (Patient taking differently: Take 75 mg by mouth daily. ) 90 tablet 4  . COD LIVER OIL PO Take 415 mg by mouth daily.    . colchicine 0.6 MG tablet Take 1 tablet (0.6 mg total) by mouth daily. PRN gout flare (Patient taking differently: Take 0.6 mg by mouth daily as needed (for foot pain/gout flare up x 3 days then discontinue). ) 90 tablet 3  . Cranberry 500 MG TABS Take 500 mg by mouth every evening.    . diazepam (VALIUM) 5 MG tablet Take 1 tablet (5 mg total) by mouth 2 (two) times daily as needed for anxiety. 10 tablet 0  . exemestane (AROMASIN) 25 MG tablet TAKE 1 TABLET  DAILY AFTER BREAKFAST (Patient taking differently: Take 25 mg by mouth daily. ) 90 tablet 4  . febuxostat (ULORIC) 40 MG tablet Take 1 tablet (40 mg total) by mouth daily. 30 tablet 3  . gabapentin (NEURONTIN) 300 MG capsule Take 300-600 mg by mouth daily as needed (nerve pain).    Marland Kitchen  gabapentin (NEURONTIN) 300 MG capsule Take 1 capsule (300 mg total) by mouth 2 (two) times daily. Take 1 during the day if needed and 2 at night 270 capsule 1  . insulin NPH Human (HUMULIN N,NOVOLIN N) 100 UNIT/ML injection Inject 20-25 Units into the skin See admin instructions. Inject 20 units in the morning & inject 25 units after supper.    Javier Docker Oil 500 MG CAPS Take 500 mg by mouth daily.    . Methylfol-Methylcob-Acetylcyst (CEREFOLIN NAC) 6-2-600 MG TABS TAKE 1 CAPLET BY MOUTH ONCE DAILY. 90 each 3  . metolazone (ZAROXOLYN) 2.5 MG tablet TAKE 1 TABLET DAILY 30 MINUTES BEFORE THE MORNING DOSE OF BUMEX AS DIRECTED 90 tablet 1  . metoprolol succinate (TOPROL-XL) 25 MG 24 hr tablet Take 0.5 tablets (12.5 mg total) by mouth every evening. 30 tablet 0  . midodrine (PROAMATINE) 5 MG tablet Take 1 tablet (5 mg total) by mouth 3 (three) times daily with meals. 90 tablet 0  . mirtazapine (REMERON) 15 MG tablet Take 15 mg by mouth at bedtime. Taking 1 1/2 tablet at bedtime    . nitroGLYCERIN (NITROSTAT) 0.4 MG SL tablet Place 0.4 mg under the tongue every 5 (five) minutes as needed for chest pain.    Marland Kitchen oxyCODONE-acetaminophen (PERCOCET/ROXICET) 5-325 MG tablet Take 1 tablet by mouth every 6 (six) hours as needed for severe pain. 28 tablet 0  . pantoprazole (PROTONIX) 40 MG tablet TAKE 1 TABLET DAILY (CONTACT OFFICE FOR ADDITIONAL REFILLS, FINAL ATTEMPT) (Patient taking differently: Take 40 mg by mouth daily before breakfast. ) 90 tablet 0  . potassium chloride SA (K-DUR,KLOR-CON) 20 MEQ tablet Take 40 mEq by mouth 2 (two) times daily.     . pramipexole (MIRAPEX) 0.25 MG tablet Take 1-2 tablets (0.25-0.5 mg total) by mouth  See admin instructions. TAKE 1 TABLET AT 4:00 P.M. AND 2 TABLETS (0.5 MG) AT BEDTIME 270 tablet 1  . rosuvastatin (CRESTOR) 20 MG tablet Take 20 mg by mouth daily with supper.     . sennosides-docusate sodium (SENOKOT-S) 8.6-50 MG tablet Take 1 tablet by mouth daily as needed for constipation.    Marland Kitchen Ubiquinol (QUNOL COQ10/UBIQUINOL/MEGA) 100 MG CAPS Take 100 mg by mouth every evening. CoQ10, Qunol      No current facility-administered medications for this visit.     PHYSICAL EXAMINATION: ECOG PERFORMANCE STATUS: 3 - Symptomatic, >50% confined to bed  No vitals taken today, Exam not performed today  LABORATORY DATA:  I have reviewed the data as listed CBC Latest Ref Rng & Units 03/04/2018 03/01/2018 02/27/2018  WBC 4.0 - 10.5 K/uL 9.1 10.1 9.2  Hemoglobin 12.0 - 15.0 g/dL 11.3(L) 9.6(L) 9.3(L)  Hematocrit 36.0 - 46.0 % 36.8 30.1(L) 29.6(L)  Platelets 150 - 400 K/uL 400 248 199     CMP Latest Ref Rng & Units 03/04/2018 03/02/2018 03/01/2018  Glucose 70 - 99 mg/dL 106(H) 121(H) 80  BUN 8 - 23 mg/dL 20 25(H) 23  Creatinine 0.44 - 1.00 mg/dL 1.70(H) 1.83(H) 1.90(H)  Sodium 135 - 145 mmol/L 139 137 140  Potassium 3.5 - 5.1 mmol/L 4.2 4.9 4.0  Chloride 98 - 111 mmol/L 100 99 102  CO2 22 - 32 mmol/L '26 29 27  '$ Calcium 8.9 - 10.3 mg/dL 9.6 9.3 9.2  Total Protein 6.5 - 8.1 g/dL - - -  Total Bilirubin 0.3 - 1.2 mg/dL - - -  Alkaline Phos 38 - 126 U/L - - -  AST 15 - 41 U/L - - -  ALT 0 - 44 U/L - - -      RADIOGRAPHIC STUDIES: I have personally reviewed the radiological images as listed and agreed with the findings in the report. No results found.   ASSESSMENT & PLAN:  [name] is a 78 y.o. female with   1. 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 She is s/p b/l breast lumpectomy and has adjuvant radiation.  - Her  Oncotype results returned low risk, recurrence score 16, which predicts 10-year of distant recurrence 10% with tamoxifen, she will not need chemotherapy.  -She started antiestrogen therapy with Exemestane in 02/2017. She is tolerating well with no side effects -She does have multiple medical comorbidities, and is not very mobile due to LE tendonitis and diabetic foot. She does have recent dizziness, which are not likely related to her exemestane.  -From a breast cancer standpoint she is clinically doing well. There is no concern for recurrence.  -Next mammogram in 3-4 months  -Continue exemestane, will f/u in 3-4 months   2. Genetics -She is positive for variant of uncertain significance identified in [place]. Negative for other gene mutations.   3. HTN, DM, CAD and CHF with LE edema  -She'll continue medication and follow-up with her primary care physician -DM controlled  -continue f/u with nephrologist [name], and her cardiologist [name]  4. CKD, Stage IV -She will continue to follow up with [name]  5. Arthritis, balance issue  -Probably related to her DM -She underwent right foot surgery in 01/2018. She had sepsis afterward. She has completed PT.  -She notes having difficulty ambulating since then and developing diabetic foot of left foot recently. She may need another surgery.  -She will continue to follow up with her podiatrist and orthopedic surgeon.   6. Osteopenia  -2016 bone density, right femur shows osteopenia  -DEXA from 02/24/17 results with a T score of -1.6 -continue calcium and vit D -Next DEXA in 01/2019   7. Dizziness  -She has had occasional dizziness with near syncope that do no last -She held Exemestane for 10 days with no change.  -Her BP will drop but not for long. She has not stopped her HTN medications.  -She will continue to follow up with her PCP and Cardiologist.     PLAN -Continue Exemestane  -Lab and f/u in 3-4 months   -Mammogram  in 3 months    No problem-specific Assessment & Plan notes found for this encounter.   Orders Placed This Encounter  Procedures  . MM DIAG BREAST TOMO BILATERAL    Standing Status:   Future    Standing Expiration Date:   08/14/2019    Order Specific Question:   Reason for Exam (SYMPTOM  OR DIAGNOSIS REQUIRED)    Answer:   screening    Order Specific Question:   Preferred imaging location?    Answer:   [facility]   I discussed the assessment and treatment plan with the patient. The patient was provided an opportunity to ask questions and all were answered. The patient agreed with the plan and demonstrated an understanding of the instructions.  The patient was advised to call back or seek an in-person evaluation if the symptoms worsen or if the condition fails to improve as anticipated.  I provided 15 minutes of non face-to-face telephone visit time during this encounter,  and > 50% was spent counseling as documented under my assessment & plan.    Truitt Merle, MD 08/14/2018   I, Joslyn Devon, am acting as scribe for Truitt Merle, MD.   I have reviewed the above documentation for accuracy and completeness, and I agree with the above.

## 2018-08-14 ENCOUNTER — Inpatient Hospital Stay: Payer: Medicare Other | Attending: Hematology | Admitting: Hematology

## 2018-08-14 ENCOUNTER — Telehealth: Payer: Self-pay | Admitting: Hematology

## 2018-08-14 ENCOUNTER — Encounter: Payer: Self-pay | Admitting: Hematology

## 2018-08-14 ENCOUNTER — Inpatient Hospital Stay: Payer: Medicare Other

## 2018-08-14 DIAGNOSIS — C50211 Malignant neoplasm of upper-inner quadrant of right female breast: Secondary | ICD-10-CM

## 2018-08-14 DIAGNOSIS — Z170 Estrogen receptor positive status [ER+]: Secondary | ICD-10-CM

## 2018-08-14 DIAGNOSIS — C50412 Malignant neoplasm of upper-outer quadrant of left female breast: Secondary | ICD-10-CM

## 2018-08-14 NOTE — Telephone Encounter (Signed)
Scheduled appt per 5/18 los. ° °A calendar will be mailed out. °

## 2018-08-31 ENCOUNTER — Institutional Professional Consult (permissible substitution): Payer: Self-pay | Admitting: Neurology

## 2018-09-07 DIAGNOSIS — N184 Chronic kidney disease, stage 4 (severe): Secondary | ICD-10-CM | POA: Diagnosis not present

## 2018-09-12 DIAGNOSIS — E876 Hypokalemia: Secondary | ICD-10-CM | POA: Diagnosis not present

## 2018-09-14 DIAGNOSIS — N184 Chronic kidney disease, stage 4 (severe): Secondary | ICD-10-CM | POA: Diagnosis not present

## 2018-09-14 DIAGNOSIS — I129 Hypertensive chronic kidney disease with stage 1 through stage 4 chronic kidney disease, or unspecified chronic kidney disease: Secondary | ICD-10-CM | POA: Diagnosis not present

## 2018-09-14 DIAGNOSIS — N2581 Secondary hyperparathyroidism of renal origin: Secondary | ICD-10-CM | POA: Diagnosis not present

## 2018-09-14 DIAGNOSIS — D631 Anemia in chronic kidney disease: Secondary | ICD-10-CM | POA: Diagnosis not present

## 2018-09-25 ENCOUNTER — Other Ambulatory Visit (HOSPITAL_COMMUNITY): Payer: Self-pay | Admitting: *Deleted

## 2018-09-26 ENCOUNTER — Ambulatory Visit (HOSPITAL_COMMUNITY)
Admission: RE | Admit: 2018-09-26 | Discharge: 2018-09-26 | Disposition: A | Discharge status: Home / Self Care | Payer: Medicare Other | Source: Ambulatory Visit | Attending: Nephrology | Admitting: Nephrology

## 2018-09-26 ENCOUNTER — Other Ambulatory Visit: Payer: Self-pay

## 2018-09-26 DIAGNOSIS — D631 Anemia in chronic kidney disease: Secondary | ICD-10-CM | POA: Insufficient documentation

## 2018-09-26 MED ORDER — SODIUM CHLORIDE 0.9 % IV SOLN
510.0000 mg | INTRAVENOUS | Status: DC
  Administered 2018-09-26: 510 mg via INTRAVENOUS
  Filled 2018-09-26: qty 510

## 2018-09-26 NOTE — Discharge Instructions (Signed)

## 2018-10-02 ENCOUNTER — Institutional Professional Consult (permissible substitution): Payer: Medicare Other | Admitting: Neurology

## 2018-10-03 ENCOUNTER — Other Ambulatory Visit: Payer: Self-pay

## 2018-10-03 ENCOUNTER — Ambulatory Visit (HOSPITAL_COMMUNITY)
Admission: RE | Admit: 2018-10-03 | Discharge: 2018-10-03 | Disposition: A | Discharge status: Home / Self Care | Payer: Medicare Other | Source: Ambulatory Visit | Attending: Nephrology | Admitting: Nephrology

## 2018-10-03 DIAGNOSIS — D631 Anemia in chronic kidney disease: Secondary | ICD-10-CM | POA: Insufficient documentation

## 2018-10-03 DIAGNOSIS — N189 Chronic kidney disease, unspecified: Secondary | ICD-10-CM | POA: Diagnosis not present

## 2018-10-03 MED ORDER — SODIUM CHLORIDE 0.9 % IV SOLN
510.0000 mg | INTRAVENOUS | Status: DC
  Administered 2018-10-03: 510 mg via INTRAVENOUS
  Filled 2018-10-03: qty 17

## 2018-10-04 ENCOUNTER — Encounter (HOSPITAL_COMMUNITY): Payer: Self-pay

## 2018-10-04 ENCOUNTER — Emergency Department (HOSPITAL_COMMUNITY)
Admission: EM | Admit: 2018-10-04 | Discharge: 2018-10-05 | Disposition: A | Discharge status: Home / Self Care | Payer: Medicare Other | Attending: Emergency Medicine | Admitting: Emergency Medicine

## 2018-10-04 ENCOUNTER — Other Ambulatory Visit: Payer: Self-pay

## 2018-10-04 ENCOUNTER — Emergency Department (HOSPITAL_COMMUNITY): Payer: Medicare Other

## 2018-10-04 DIAGNOSIS — R51 Headache: Secondary | ICD-10-CM | POA: Diagnosis not present

## 2018-10-04 DIAGNOSIS — M5481 Occipital neuralgia: Secondary | ICD-10-CM | POA: Diagnosis not present

## 2018-10-04 DIAGNOSIS — Z20828 Contact with and (suspected) exposure to other viral communicable diseases: Secondary | ICD-10-CM | POA: Diagnosis not present

## 2018-10-04 DIAGNOSIS — I130 Hypertensive heart and chronic kidney disease with heart failure and stage 1 through stage 4 chronic kidney disease, or unspecified chronic kidney disease: Secondary | ICD-10-CM | POA: Diagnosis not present

## 2018-10-04 DIAGNOSIS — N3000 Acute cystitis without hematuria: Secondary | ICD-10-CM | POA: Diagnosis not present

## 2018-10-04 DIAGNOSIS — Z7901 Long term (current) use of anticoagulants: Secondary | ICD-10-CM | POA: Insufficient documentation

## 2018-10-04 DIAGNOSIS — Z794 Long term (current) use of insulin: Secondary | ICD-10-CM | POA: Insufficient documentation

## 2018-10-04 DIAGNOSIS — Z79899 Other long term (current) drug therapy: Secondary | ICD-10-CM | POA: Diagnosis not present

## 2018-10-04 DIAGNOSIS — I2510 Atherosclerotic heart disease of native coronary artery without angina pectoris: Secondary | ICD-10-CM | POA: Diagnosis not present

## 2018-10-04 DIAGNOSIS — I6782 Cerebral ischemia: Secondary | ICD-10-CM | POA: Diagnosis not present

## 2018-10-04 DIAGNOSIS — I509 Heart failure, unspecified: Secondary | ICD-10-CM | POA: Insufficient documentation

## 2018-10-04 DIAGNOSIS — I959 Hypotension, unspecified: Secondary | ICD-10-CM | POA: Diagnosis not present

## 2018-10-04 DIAGNOSIS — Z853 Personal history of malignant neoplasm of breast: Secondary | ICD-10-CM | POA: Insufficient documentation

## 2018-10-04 DIAGNOSIS — N184 Chronic kidney disease, stage 4 (severe): Secondary | ICD-10-CM | POA: Insufficient documentation

## 2018-10-04 DIAGNOSIS — J9811 Atelectasis: Secondary | ICD-10-CM | POA: Diagnosis not present

## 2018-10-04 DIAGNOSIS — R5381 Other malaise: Secondary | ICD-10-CM | POA: Diagnosis not present

## 2018-10-04 DIAGNOSIS — E1122 Type 2 diabetes mellitus with diabetic chronic kidney disease: Secondary | ICD-10-CM | POA: Diagnosis not present

## 2018-10-04 DIAGNOSIS — R0902 Hypoxemia: Secondary | ICD-10-CM | POA: Diagnosis not present

## 2018-10-04 DIAGNOSIS — R52 Pain, unspecified: Secondary | ICD-10-CM | POA: Diagnosis not present

## 2018-10-04 DIAGNOSIS — E876 Hypokalemia: Secondary | ICD-10-CM

## 2018-10-04 DIAGNOSIS — R4781 Slurred speech: Secondary | ICD-10-CM | POA: Diagnosis not present

## 2018-10-04 LAB — CBC WITH DIFFERENTIAL/PLATELET
Abs Immature Granulocytes: 0.07 10*3/uL (ref 0.00–0.07)
Basophils Absolute: 0.1 10*3/uL (ref 0.0–0.1)
Basophils Relative: 1 %
Eosinophils Absolute: 0.2 10*3/uL (ref 0.0–0.5)
Eosinophils Relative: 2 %
HCT: 38.1 % (ref 36.0–46.0)
Hemoglobin: 12.1 g/dL (ref 12.0–15.0)
Immature Granulocytes: 1 %
Lymphocytes Relative: 17 %
Lymphs Abs: 1.7 10*3/uL (ref 0.7–4.0)
MCH: 29.2 pg (ref 26.0–34.0)
MCHC: 31.8 g/dL (ref 30.0–36.0)
MCV: 92 fL (ref 80.0–100.0)
Monocytes Absolute: 0.8 10*3/uL (ref 0.1–1.0)
Monocytes Relative: 8 %
Neutro Abs: 7.2 10*3/uL (ref 1.7–7.7)
Neutrophils Relative %: 71 %
Platelets: 259 10*3/uL (ref 150–400)
RBC: 4.14 MIL/uL (ref 3.87–5.11)
RDW: 16.3 % — ABNORMAL HIGH (ref 11.5–15.5)
WBC: 10 10*3/uL (ref 4.0–10.5)
nRBC: 0 % (ref 0.0–0.2)

## 2018-10-04 LAB — COMPREHENSIVE METABOLIC PANEL
ALT: 17 U/L (ref 0–44)
AST: 24 U/L (ref 15–41)
Albumin: 3.5 g/dL (ref 3.5–5.0)
Alkaline Phosphatase: 95 U/L (ref 38–126)
Anion gap: 13 (ref 5–15)
BUN: 42 mg/dL — ABNORMAL HIGH (ref 8–23)
CO2: 27 mmol/L (ref 22–32)
Calcium: 9.5 mg/dL (ref 8.9–10.3)
Chloride: 96 mmol/L — ABNORMAL LOW (ref 98–111)
Creatinine, Ser: 1.89 mg/dL — ABNORMAL HIGH (ref 0.44–1.00)
GFR calc Af Amer: 29 mL/min — ABNORMAL LOW (ref >60)
GFR calc non Af Amer: 25 mL/min — ABNORMAL LOW (ref >60)
Glucose, Bld: 159 mg/dL — ABNORMAL HIGH (ref 70–99)
Potassium: 3 mmol/L — ABNORMAL LOW (ref 3.5–5.1)
Sodium: 136 mmol/L (ref 135–145)
Total Bilirubin: 0.8 mg/dL (ref 0.3–1.2)
Total Protein: 6.5 g/dL (ref 6.5–8.1)

## 2018-10-04 LAB — URINALYSIS, ROUTINE W REFLEX MICROSCOPIC
Bilirubin Urine: NEGATIVE
Glucose, UA: NEGATIVE mg/dL
Hgb urine dipstick: NEGATIVE
Ketones, ur: NEGATIVE mg/dL
Nitrite: POSITIVE — AB
Protein, ur: NEGATIVE mg/dL
Specific Gravity, Urine: 1.008 (ref 1.005–1.030)
WBC, UA: >50 WBC/hpf — ABNORMAL HIGH (ref 0–5)
pH: 6 (ref 5.0–8.0)

## 2018-10-04 LAB — LACTIC ACID, PLASMA: Lactic Acid, Venous: 1.3 mmol/L (ref 0.5–1.9)

## 2018-10-04 LAB — MAGNESIUM: Magnesium: 2 mg/dL (ref 1.7–2.4)

## 2018-10-04 LAB — SARS CORONAVIRUS 2 BY RT PCR (HOSPITAL ORDER, PERFORMED IN ~~LOC~~ HOSPITAL LAB): SARS Coronavirus 2: NEGATIVE

## 2018-10-04 LAB — CBG MONITORING, ED: Glucose-Capillary: 155 mg/dL — ABNORMAL HIGH (ref 70–99)

## 2018-10-04 MED ORDER — POTASSIUM CHLORIDE 10 MEQ/100ML IV SOLN
10.0000 meq | Freq: Once | INTRAVENOUS | Status: AC
  Administered 2018-10-04: 10 meq via INTRAVENOUS
  Filled 2018-10-04: qty 100

## 2018-10-04 MED ORDER — CEPHALEXIN 500 MG PO CAPS: 500.0000 mg | ORAL_CAPSULE | Freq: Four times a day (QID) | ORAL | 0 refills | Status: DC

## 2018-10-04 MED ORDER — POTASSIUM CHLORIDE CRYS ER 20 MEQ PO TBCR: 80.0000 meq | EXTENDED_RELEASE_TABLET | Freq: Two times a day (BID) | ORAL | 0 refills | Status: DC

## 2018-10-04 MED ORDER — MAGNESIUM SULFATE 2 GM/50ML IV SOLN
2.0000 g | Freq: Once | INTRAVENOUS | Status: AC
  Administered 2018-10-04: 2 g via INTRAVENOUS
  Filled 2018-10-04: qty 50

## 2018-10-04 MED ORDER — POTASSIUM CHLORIDE CRYS ER 20 MEQ PO TBCR
40.0000 meq | EXTENDED_RELEASE_TABLET | Freq: Once | ORAL | Status: AC
  Administered 2018-10-04: 40 meq via ORAL
  Filled 2018-10-04: qty 2

## 2018-10-04 MED ORDER — POTASSIUM CHLORIDE CRYS ER 20 MEQ PO TBCR: 40.0000 meq | EXTENDED_RELEASE_TABLET | Freq: Two times a day (BID) | ORAL | Status: DC

## 2018-10-04 MED ORDER — LACTATED RINGERS IV BOLUS
500.0000 mL | Freq: Once | INTRAVENOUS | Status: AC
  Administered 2018-10-04: 500 mL via INTRAVENOUS

## 2018-10-04 MED ORDER — SODIUM CHLORIDE 0.9 % IV SOLN
1.0000 g | Freq: Once | INTRAVENOUS | Status: AC
  Administered 2018-10-05: 1 g via INTRAVENOUS
  Filled 2018-10-04: qty 10

## 2018-10-04 NOTE — ED Notes (Signed)
Daughter-Angie [phone], Brett Canales [phone]

## 2018-10-04 NOTE — ED Notes (Signed)
Bladder scan showed 612ml. Going to straight cath pt, pt said husband has to straight cath her sometimes

## 2018-10-04 NOTE — ED Triage Notes (Signed)
1740 Pt semi-responsive, when became responsive had slurred speech and AMS of what she did last night. Pt c/o HA and weakness a couple of days. On a blood thinner plavix. Per EMS their stroke scale was neg.

## 2018-10-04 NOTE — ED Notes (Signed)
PT informed urine specimen needs to be collected. PT placed on Purewick.

## 2018-10-04 NOTE — ED Notes (Signed)
Patient transported to CT 

## 2018-10-05 ENCOUNTER — Ambulatory Visit: Payer: Medicare Other | Admitting: Adult Health

## 2018-10-05 DIAGNOSIS — N3000 Acute cystitis without hematuria: Secondary | ICD-10-CM | POA: Diagnosis not present

## 2018-10-05 NOTE — ED Provider Notes (Signed)
  Physical Exam  BP 120/79   Pulse 78   Temp 98.1 F (36.7 C) (Oral)   Resp 19   Ht 5\' 6"  (1.676 m)   Wt 83.9 kg   SpO2 98%   BMI 29.86 kg/m   Physical Exam  ED Course/Procedures     Procedures  MDM  Patient was discharged by Dr. Dayna Barker.  She has UTI.  Work-up otherwise was negative.       Varney Biles, MD 10/05/18 [phone]

## 2018-10-06 LAB — URINE CULTURE: Culture: >=100000 — AB

## 2018-10-07 ENCOUNTER — Telehealth (HOSPITAL_COMMUNITY): Payer: Self-pay | Admitting: Pharmacist

## 2018-10-07 ENCOUNTER — Telehealth (HOSPITAL_BASED_OUTPATIENT_CLINIC_OR_DEPARTMENT_OTHER): Payer: Self-pay | Admitting: Emergency Medicine

## 2018-10-07 NOTE — [place] Notes (Signed)
ED Antimicrobial Stewardship Positive Culture Follow Up   [name] is an 78 y.o. female who presented to [facility] on (Not on file) with a chief complaint of No chief complaint on file.   Recent Results (from the past 720 hour(s))  SARS Coronavirus 2 (CEPHEID - Performed in [name] hospital lab), Hosp Order     Status: None   Collection Time: 10/04/18  8:50 PM   Specimen: Nasopharyngeal Swab  Result Value Ref Range Status   SARS Coronavirus 2 NEGATIVE NEGATIVE Final    Comment: (NOTE) If result is NEGATIVE SARS-CoV-2 target nucleic acids are NOT DETECTED. The SARS-CoV-2 RNA is generally detectable in upper and lower  respiratory specimens during the acute phase of infection. The lowest  concentration of SARS-CoV-2 viral copies this assay can detect is 250  copies / mL. A negative result does not preclude SARS-CoV-2 infection  and should not be used as the sole basis for treatment or other  patient management decisions.  A negative result may occur with  improper specimen collection / handling, submission of specimen other  than nasopharyngeal swab, presence of viral mutation(s) within the  areas targeted by this assay, and inadequate number of viral copies  (<250 copies / mL). A negative result must be combined with clinical  observations, patient history, and epidemiological information. If result is POSITIVE SARS-CoV-2 target nucleic acids are DETECTED. The SARS-CoV-2 RNA is generally detectable in upper and lower  respiratory specimens dur ing the acute phase of infection.  Positive  results are indicative of active infection with SARS-CoV-2.  Clinical  correlation with patient history and other diagnostic information is  necessary to determine patient infection status.  Positive results do  not rule out bacterial infection or co-infection with other viruses. If result is PRESUMPTIVE POSTIVE SARS-CoV-2 nucleic acids MAY BE PRESENT.   A presumptive positive result was  obtained on the submitted specimen  and confirmed on repeat testing.  While 2019 novel coronavirus  (SARS-CoV-2) nucleic acids may be present in the submitted sample  additional confirmatory testing may be necessary for epidemiological  and / or clinical management purposes  to differentiate between  SARS-CoV-2 and other Sarbecovirus currently known to infect humans.  If clinically indicated additional testing with an alternate test  methodology [phone]) is advised. The SARS-CoV-2 RNA is generally  detectable in upper and lower respiratory sp ecimens during the acute  phase of infection. The expected result is Negative. Fact Sheet for Patients:  StrictlyIdeas.no Fact Sheet for Healthcare Providers: BankingDealers.co.za This test is not yet approved or cleared by the [place] FDA and has been authorized for detection and/or diagnosis of SARS-CoV-2 by FDA under an Emergency Use Authorization (EUA).  This EUA will remain in effect (meaning this test can be used) for the duration of the COVID-19 declaration under Section 564(b)(1) of the Act, 21 U.S.C. section 360bbb-3(b)(1), unless the authorization is terminated or revoked sooner. Performed at [facility], [place] [address], [place], [place] [address]   Urine culture     Status: Abnormal   Collection Time: 10/04/18 10:52 PM   Specimen: Urine, Catheterized  Result Value Ref Range Status   Specimen Description URINE, CATHETERIZED  Final   Special Requests   Final    NONE Performed at [facility], [place] [address] [place], [place] [address]    Culture >=100,000 COLONIES/mL CITROBACTER AMALONATICUS (A)  Final   Report Status 10/06/2018 FINAL  Final   Organism ID, Bacteria  CITROBACTER AMALONATICUS (A)  Final      Susceptibility   Citrobacter amalonaticus - MIC*    CEFAZOLIN >=64 RESISTANT Resistant     CEFTRIAXONE <=1 SENSITIVE Sensitive     CIPROFLOXACIN <=0.25 SENSITIVE  Sensitive     GENTAMICIN <=1 SENSITIVE Sensitive     IMIPENEM <=0.25 SENSITIVE Sensitive     NITROFURANTOIN 64 INTERMEDIATE Intermediate     TRIMETH/SULFA <=20 SENSITIVE Sensitive     PIP/TAZO <=4 SENSITIVE Sensitive     * >=100,000 COLONIES/mL CITROBACTER AMALONATICUS    [x]  Treated with Cephalexin, organism resistant to prescribed antimicrobial []  Patient discharged originally without antimicrobial agent and treatment is now indicated  New antibiotic prescription: Bactrim DS 1 tab po BID x 3 days *Stop Cephalexin  ED Provider: S. Petrucelli, PA-C   [name] 10/07/2018, 9:13 AM Clinical Pharmacist Monday - Friday phone -  [phone] Saturday - Sunday phone - [phone]

## 2018-10-08 NOTE — Telephone Encounter (Signed)
Post ED Visit - Positive Culture Follow-up: Successful Patient Follow-Up  Culture assessed and recommendations reviewed by:  []  Elenor Quinones, Pharm.D. []  Heide Guile, Pharm.D., BCPS AQ-ID []  Parks Neptune, Pharm.D., BCPS []  Alycia Rossetti, Pharm.D., BCPS []  [place], Pharm.D., BCPS, AAHIVP []  Legrand Como, Pharm.D., BCPS, AAHIVP []  Salome Arnt, PharmD, BCPS []  Johnnette Gourd, PharmD, BCPS [x]  Hughes Better, PharmD, BCPS []  Leeroy Cha, PharmD  Positive urine culture  []  Patient discharged without antimicrobial prescription and treatment is now indicated [x]  Organism is resistant to prescribed ED discharge antimicrobial []  Patient with positive blood cultures  Changes discussed with ED provider: Kennith Maes PA New antibiotic prescription: Bactrim DS one tab PO BID x three days  Unsuccessful attempts to contact patient by phone. Letter sent to address on file.   [name] [name] 10/08/2018, 4:05 PM

## 2018-10-09 ENCOUNTER — Telehealth: Payer: Self-pay

## 2018-10-09 NOTE — Telephone Encounter (Signed)
Post ED Visit - Positive Culture Follow-up: Successful Patient Follow-Up  Culture assessed and recommendations reviewed by:  []  Elenor Quinones, Pharm.D. []  Heide Guile, Pharm.D., BCPS AQ-ID []  Parks Neptune, Pharm.D., BCPS []  Alycia Rossetti, Pharm.D., BCPS []  [place], Pharm.D., BCPS, AAHIVP []  Legrand Como, Pharm.D., BCPS, AAHIVP []  Salome Arnt, PharmD, BCPS []  Johnnette Gourd, PharmD, BCPS []  Hughes Better, PharmD, BCPS []  Leeroy Cha, PharmD Pharm D Positive urine culture  []  Patient discharged without antimicrobial prescription and treatment is now indicated [x]  Organism is resistant to prescribed ED discharge antimicrobial []  Patient with positive blood cultures  Changes discussed with ED provider: Kennith Maes PA New antibiotic prescription Bactrim DS 1 PO BId x 3 days  Called to [facility] [phone]  Contacted patient, date 10/09/2018, time 1008   [name], Carolynn Comment 10/09/2018, 10:06 AM

## 2018-10-22 NOTE — ED Provider Notes (Signed)
[facility] EMERGENCY DEPARTMENT Provider Note   CSN: [id number] Arrival date & time: 10/04/18  1925     History   Chief Complaint Chief Complaint  Patient presents with   Headache    HPI [name] is a 78 y.o. female.      Headache Pain location:  Generalized Quality:  Dull Radiates to:  Does not radiate Timing:  Constant Chronicity:  New Similar to prior headaches: yes   Relieved by:  None tried Worsened by:  Nothing Ineffective treatments:  None tried   Past Medical History:  Diagnosis Date   Anemia    have received iron infusions   Anxiety    Arthritis     osteoarthritis   Breast cancer ([place])    Breast cancer in female [facility])    Bilateral   CAD S/P percutaneous coronary angioplasty 2007; March 2011   [facility] [facility] 3.0 mm 20 mm postdilated 3.6 mm in early mid LAD; status post ISR Cutting Balloon PTCA and March 11 along with PCI of distal mid lesion with a 3.0 mm 12 mm MultiLink vision BMS; the proximal stent causes jailing of SP1 and SP2 with ostial 70-80% lesions   Cancer (HCC)    Melanoma, Squamous cell Carcinoma   CHF (congestive heart failure) (HCC)    Chronic kidney disease    stage 2    CKD (chronic kidney disease) stage 4, GFR 15-29 ml/min (HCC) 11/17/2015   Dementia ([place])    mild   Diabetes mellitus type 2 with neurological manifestations ([place])    Diabetes mellitus without complication (HCC)    Dyslipidemia, goal LDL below 70    Full dentures    Genetic testing 12/03/2016   Germline genetic testing was performed through [employer]'s Common Hereditary Cancers Panel + [facility]'s Melanoma Panel. This custom panel includes analysis of the following 51 genes: APC, ATM, AXIN2, BAP1, BARD1, BMPR1A, BRCA1, BRCA2, BRIP1, CDH1, CDK4, CDKN2A, CHEK2, CTNNA1, DICER1, EPCAM, GREM1, HOXB13, KIT, MEN1, MITF, MLH1, MSH2, MSH3, MSH6, MUTYH, NBN, NF1, NTHL1, PALB2, PDGFRA, PMS2, POLD1, POL   GERD (gastroesophageal reflux disease)     Headache(784.0)    History of hematuria    Followed by Dr. Gaynelle Arabian   Hypertension, essential, benign    Nausea & vomiting 10/2015   Peripheral neuropathy    Stroke [facility])    TIA (transient ischemic attack)    multiple in the past   Wears glasses     Patient Active Problem List   Diagnosis Date Noted   Somnolence, daytime 04/06/2018   Acute on chronic diastolic CHF (congestive heart failure) ([place]) 02/28/2018   SOB (shortness of breath)    Hypotension    Elevated troponin 02/25/2018   Hypomagnesemia 02/25/2018   Hypokalemia 02/24/2018   Urinary retention 02/24/2018   Sepsis ([place]) 02/24/2018   Posterior tibial tendinitis of right leg 02/22/2018   Posterior tibial tendinitis, right leg    Right atrial mass    Chronic diastolic heart failure ([place]) 08/01/2017   Genetic testing 12/03/2016   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   Malignant neoplasm of upper-outer quadrant of left breast in female, estrogen receptor positive ([place]) 11/19/2016   Chronic idiopathic gout involving toe of left foot without tophus 11/15/2016   Malignant 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, estrogen receptor positive ([place]) 11/09/2016   Tendon tear, ankle, left, sequela    Traumatic rupture of left anterior tibial tendon 04/20/2016   Acute kidney injury superimposed on CKD ([place])    Uncontrolled  type 2 diabetes mellitus with complication (HCC)    Headache, migraine    Unexplained weight loss 11/17/2015   CKD (chronic kidney disease) stage 4, GFR 15-29 ml/min (HCC) 11/17/2015   Restless legs syndrome 07/24/2015   Mild cognitive impairment 01/08/2015   Abnormality of gait 01/08/2015   Dizziness 12/19/2014   Preoperative cardiovascular examination 12/11/2013   GERD (gastroesophageal reflux disease) 10/06/2013   Syncope 10/05/2013   Anxiety 10/28/2012    Class: Acute   Bilateral lower extremity edema 10/28/2012   CAD S/P percutaneous coronary  angioplasty    Essential hypertension    Dyslipidemia, goal LDL below 70     Past Surgical History:  Procedure Laterality Date   ABDOMINAL HYSTERECTOMY     ANKLE FUSION Right 02/22/2018   Procedure: RIGHT SUBTALAR AND TALONAVICULAR FUSION;  Surgeon: Newt Minion, MD;  Location: [facility];  Service: Orthopedics;  Laterality: Right;   APPENDECTOMY     BLADDER SUSPENSION     BREAST LUMPECTOMY Bilateral 2018   BREAST LUMPECTOMY WITH RADIOACTIVE SEED AND SENTINEL LYMPH NODE BIOPSY Bilateral 12/14/2016   Procedure: BILATERAL BREAST LUMPECTOMY WITH BILATERAL  RADIOACTIVE SEED AND BILATERAL AXILLARY  SENTINEL LYMPH NODE BIOPSY ERAS PATHWAY;  Surgeon: Alphonsa Overall, MD;  Location: [facility];  Service: General;  Laterality: Bilateral;  [place]  02/24/2006   85% stenosis in the proximal portion of LAD-arrangements made for PCI on 02/25/2006   CARDIAC CATHETERIZATION  02/25/2006   80% LAD lesion stented with a 3x16m Liberte stent resulting in reduction of 80% lesion to 0% residual   CARDIAC CATHETERIZATION  04/26/2006   Medical management   CARDIAC CATHETERIZATION  06/24/2009   60-70% re-stenosis in the proximal LAD. A 3.25x15 cutting balloon, 3 inflations - 14atm-38sec, 13atm-39sec, and 12atm-40sec reduced to less than 10%. 60% stenosis of the mid/distal LAD stented with a 3x192mMultilink stent.   CARDIAC CATHETERIZATION  07/09/2009   Medical management   CAROTID DOPPLER  06/24/2009   40-59% R ICA stenosis. No significant ICA stenosis noted   CHOLECYSTECTOMY     DILATION AND CURETTAGE OF UTERUS     JOINT REPLACEMENT Left    LESION REMOVAL Right 03/11/2015   Procedure:  EXCISION OF RIGHT PRE TIBIAL LESION;  Surgeon: JaJudeth HornMD;  [place] Service: General;  Laterality: Right;   MASS EXCISION Left 06/10/2014   Procedure: EXCISION LEFT LOWER LEG LESION;  Surgeon: JaDoreen SalvageMD;  [place]  Service: General;  Laterality: Left;   MASS EXCISION Left 09/05/2015   Procedure: EXCISION OF LEFT FOREARM  MASS;  Surgeon: JaJudeth HornMD;  [place] [facility] Service: General;  Laterality: Left;   NM MYOVIEW LTD  11/16/2011   5 beats a PVC during recovery.  No skin or infarction.  Reached 5 METs, EKG negative for ischemia    NM MYOVIEW LTD  10/2011   No ischemia or infarction.  Normal EF.  LOW RISK. (Short bursts of 5 beats NSVT during recovery otherwise normal)   SHOULDER ARTHROSCOPY  10/15   SHOULDER ARTHROSCOPY  1995   left   TEE WITHOUT CARDIOVERSION N/A 08/03/2017   Procedure: TRANSESOPHAGEAL ECHOCARDIOGRAM (TEE);  Surgeon: RaSkeet LatchMD;  [place] Service: Cardiovascular;; EF 60-65% with no wall motion normalities.  No atrial thrombus noted.  Lightly findings consistent with a lipomatous hypertrophy of the atrial septum.    TENDON REPAIR Left 05/12/2016   Procedure:  Left Anterior Tibial Tendon Reconstruction;  Surgeon: Newt Minion, MD;  Location: [facility];  Service: Orthopedics;  Laterality: Left;   TOTAL KNEE ARTHROPLASTY  2005   left   TRANSESOPHAGEAL ECHOCARDIOGRAM  08/03/2016   : EF 60-65% no wall motion normalities.  No atrial thrombus.  Lipomatous hypertrophy of the atrial septum.  No mass noted.   TRANSTHORACIC ECHOCARDIOGRAM  10/2015   EF 55-60 %. Mild LVH. Mild pulmonary hypertension. Normal valves.   TRANSTHORACIC ECHOCARDIOGRAM  07/2017   Vigorous LV function.  EF 65-70%.  Mild diastolic dysfunction with no RWMA. GR 1 DD.  Mild aortic sclerosis/no stenosis.  Questionable right atrial mass discounted with TEE)    WRIST ARTHROPLASTY  2010   cancer lt wrist     OB History   No obstetric history on file.      Home Medications    Prior to Admission medications   Medication Sig Start Date End Date Taking? Authorizing Provider  bumetanide (BUMEX) 2 MG tablet Take 1 tablet (2 mg total) by mouth 2 (two) times daily.  03/02/18   Dessa Phi, DO  Calcium Carb-Cholecalciferol (CALCIUM 600+D3) 600-800 MG-UNIT TABS Take 1 tablet by mouth daily.    [provider]  cephALEXin (KEFLEX) 500 MG capsule Take 1 capsule (500 mg total) by mouth 4 (four) times daily. 10/04/18   Mackinley Kiehn, Corene Cornea, MD  clopidogrel (PLAVIX) 75 MG tablet TAKE 1 TABLET DAILY Patient taking differently: Take 75 mg by mouth daily.  01/18/18   Leonie Man, MD  COD LIVER OIL PO Take 415 mg by mouth daily.    [provider]  colchicine 0.6 MG tablet Take 1 tablet (0.6 mg total) by mouth daily. PRN gout flare Patient taking differently: Take 0.6 mg by mouth daily as needed (for foot pain/gout flare up x 3 days then discontinue).  01/19/18   Newt Minion, MD  Cranberry 500 MG TABS Take 500 mg by mouth every evening.    [provider]  diazepam (VALIUM) 5 MG tablet Take 1 tablet (5 mg total) by mouth 2 (two) times daily as needed for anxiety. 03/02/18   Dessa Phi, DO  exemestane (AROMASIN) 25 MG tablet TAKE 1 TABLET DAILY AFTER BREAKFAST Patient taking differently: Take 25 mg by mouth daily.  01/16/18   Truitt Merle, MD  febuxostat (ULORIC) 40 MG tablet Take 1 tablet (40 mg total) by mouth daily. 06/02/18   Rayburn, Neta Mends, PA-C  gabapentin (NEURONTIN) 300 MG capsule Take 300-600 mg by mouth daily as needed (nerve pain).    [provider]  gabapentin (NEURONTIN) 300 MG capsule Take 1 capsule (300 mg total) by mouth 2 (two) times daily. Take 1 during the day if needed and 2 at night 04/06/18   Dennie Bible, NP  insulin NPH Human (HUMULIN N,NOVOLIN N) 100 UNIT/ML injection Inject 20-25 Units into the skin See admin instructions. Inject 20 units in the morning & inject 25 units after supper.    [provider]  Javier Docker Oil 500 MG CAPS Take 500 mg by mouth daily.    [provider]  Methylfol-Methylcob-Acetylcyst (CEREFOLIN NAC) 6-2-600 MG TABS TAKE 1 CAPLET BY MOUTH ONCE DAILY. 07/24/18    Garvin Fila, MD  metolazone (ZAROXOLYN) 2.5 MG tablet TAKE 1 TABLET DAILY 30 MINUTES BEFORE THE MORNING DOSE OF BUMEX AS DIRECTED 08/09/18   Leonie Man, MD  metoprolol succinate (TOPROL-XL) 25 MG 24 hr tablet Take 0.5 tablets (12.5 mg total) by mouth  every evening. 03/02/18   Dessa Phi, DO  midodrine (PROAMATINE) 5 MG tablet Take 1 tablet (5 mg total) by mouth 3 (three) times daily with meals. 03/02/18   Dessa Phi, DO  mirtazapine (REMERON) 15 MG tablet Take 15 mg by mouth at bedtime. Taking 1 1/2 tablet at bedtime    [provider]  nitroGLYCERIN (NITROSTAT) 0.4 MG SL tablet Place 0.4 mg under the tongue every 5 (five) minutes as needed for chest pain.    [provider]  oxyCODONE-acetaminophen (PERCOCET/ROXICET) 5-325 MG tablet Take 1 tablet by mouth every 6 (six) hours as needed for severe pain. 06/02/18   Rayburn, Neta Mends, PA-C  pantoprazole (PROTONIX) 40 MG tablet TAKE 1 TABLET DAILY (CONTACT OFFICE FOR ADDITIONAL REFILLS, FINAL ATTEMPT) Patient taking differently: Take 40 mg by mouth daily before breakfast.  02/02/16   Leonie Man, MD  potassium chloride SA (K-DUR) 20 MEQ tablet Take 4 tablets (80 mEq total) by mouth 2 (two) times daily for 7 days. Then return to previous dosing 10/04/18 10/11/18  Adriel Kessen, Corene Cornea, MD  pramipexole (MIRAPEX) 0.25 MG tablet Take 1-2 tablets (0.25-0.5 mg total) by mouth See admin instructions. TAKE 1 TABLET AT 4:00 P.M. AND 2 TABLETS (0.5 MG) AT BEDTIME 07/17/18   Garvin Fila, MD  rosuvastatin (CRESTOR) 20 MG tablet Take 20 mg by mouth daily with supper.     [provider]  sennosides-docusate sodium (SENOKOT-S) 8.6-50 MG tablet Take 1 tablet by mouth daily as needed for constipation.    [provider]  Ubiquinol (QUNOL COQ10/UBIQUINOL/MEGA) 100 MG CAPS Take 100 mg by mouth every evening. CoQ10, Qunol     [provider]    Family History Family History  Problem Relation Age of Onset    Diabetes Mother    Hypertension Mother    Kidney disease Mother    Hypertension Father    Emphysema Father    Heart attack Father    Heart disease Father    Arthritis Sister    Diabetes Sister    Hypertension Sister    Breast cancer Sister 46       treated with mastectomy   Cervical cancer Sister 17   Hypertension Brother    Hyperlipidemia Brother    Diabetes Brother    Stroke Brother    Diabetes Sister    Hypertension Sister    Stomach cancer Sister 50   Hypertension Brother    ALS Brother        d.62s   Breast cancer Daughter 42       triple negative treated with lumpectomy, chemo, radiation   Heart attack Daughter 5       d.45   COPD Daughter    Cancer Paternal Aunt        unspecified type   Cancer Paternal Uncle        unspecified type   Cancer Paternal Uncle        unspecified type    Social History Social History   Tobacco Use   Smoking status: Never Smoker   Smokeless tobacco: Never Used  Substance Use Topics   Alcohol use: No   Drug use: No     Allergies   Ultram [tramadol], Lipitor [atorvastatin], Lyrica [pregabalin], Reglan [metoclopramide], and Nsaids   Review of Systems Review of Systems  Neurological: Positive for headaches.  All other systems reviewed and are negative.    Physical Exam Updated Vital Signs BP 120/79    Pulse 78  Temp 98.1 F (36.7 C) (Oral)    Resp 19    Ht _0  (1.676 m)    Wt 83.9 kg    SpO2 98%    BMI 29.86 kg/m   Physical Exam Vitals signs and nursing note reviewed.  Constitutional:      Appearance: She is well-developed.  HENT:     Head: Normocephalic and atraumatic.  Eyes:     General: No visual field deficit. Neck:     Musculoskeletal: Normal range of motion.  Cardiovascular:     Rate and Rhythm: Normal rate and regular rhythm.  Pulmonary:     Effort: No respiratory distress.     Breath sounds: No stridor.  Abdominal:     General: There is no distension.    Musculoskeletal: Normal range of motion.  Skin:    General: Skin is warm and dry.  Neurological:     Mental Status: She is alert.     GCS: GCS eye subscore is 4. GCS verbal subscore is 5. GCS motor subscore is 6.     Cranial Nerves: No cranial nerve deficit, dysarthria or facial asymmetry.     Motor: No weakness.     Coordination: Coordination normal.     Gait: Gait normal.     Deep Tendon Reflexes: Reflexes normal.  Psychiatric:        Mood and Affect: Mood normal.      ED Treatments / Results  Labs (all labs ordered are listed, but only abnormal results are displayed) Labs Reviewed  URINE CULTURE - Abnormal; Notable for the following components:      Result Value   Culture >=100,000 COLONIES/mL CITROBACTER AMALONATICUS (*)    Organism ID, Bacteria CITROBACTER AMALONATICUS (*)    All other components within normal limits  CBC WITH DIFFERENTIAL/PLATELET - Abnormal; Notable for the following components:   RDW 16.3 (*)    All other components within normal limits  COMPREHENSIVE METABOLIC PANEL - Abnormal; Notable for the following components:   Potassium 3.0 (*)    Chloride 96 (*)    Glucose, Bld 159 (*)    BUN 42 (*)    Creatinine, Ser 1.89 (*)    GFR calc non Af Amer 25 (*)    GFR calc Af Amer 29 (*)    All other components within normal limits  URINALYSIS, ROUTINE W REFLEX MICROSCOPIC - Abnormal; Notable for the following components:   APPearance CLOUDY (*)    Nitrite POSITIVE (*)    Leukocytes,Ua LARGE (*)    WBC, UA >50 (*)    Bacteria, UA MANY (*)    All other components within normal limits  CBG MONITORING, ED - Abnormal; Notable for the following components:   Glucose-Capillary 155 (*)    All other components within normal limits  SARS CORONAVIRUS 2 (HOSPITAL ORDER, [facility])  LACTIC ACID, PLASMA  MAGNESIUM    EKG EKG Interpretation  Date/Time:  Wednesday October 04 2018 20:14:10 EDT Ventricular Rate:  70 PR Interval:    QRS  Duration: 106 QT Interval:  402 QTC Calculation: 434 R Axis:   48 Text Interpretation:  Sinus rhythm Ventricular premature complex Borderline prolonged PR interval No significant change was found Confirmed by Jola Schmidt [phone]) on 10/05/2018 11:45:07 PM   Radiology No results found.  Procedures Procedures (including critical care time)  Medications Ordered in ED Medications  lactated ringers bolus 500 mL (0 mLs Intravenous Stopped 10/04/18 2205)  potassium chloride SA (K-DUR) CR  tablet 40 mEq (40 mEq Oral Given 10/04/18 2206)  potassium chloride 10 mEq in 100 mL IVPB (0 mEq Intravenous Stopped 10/04/18 2314)  magnesium sulfate IVPB 2 g 50 mL (0 g Intravenous Stopped 10/04/18 2304)  cefTRIAXone (ROCEPHIN) 1 g in sodium chloride 0.9 % 100 mL IVPB (0 g Intravenous Stopped 10/05/18 0056)     Initial Impression / Assessment and Plan / ED Course  I have reviewed the triage vital signs and the nursing notes.  Pertinent labs & imaging results that were available during my care of the patient were reviewed by me and considered in my medical decision making (see chart for details).        Headache. On blood thinners. No evidence of bleed/mass or tempral arteritis as cause. Found to have UTI. Culture sent abx started. Low K as well, repleted and will  continue at home.   Final Clinical Impressions(s) / ED Diagnoses   Final diagnoses:  Acute cystitis without hematuria  Hypokalemia    ED Discharge Orders         Ordered    cephALEXin (KEFLEX) 500 MG capsule  4 times daily,   Status:  Discontinued     10/04/18 2356    potassium chloride SA (K-DUR) 20 MEQ tablet  2 times daily     10/04/18 2357    cephALEXin (KEFLEX) 500 MG capsule  4 times daily     10/04/18 2357           Domanic Matusek, Corene Cornea, MD 10/22/18 2309

## 2018-10-23 ENCOUNTER — Ambulatory Visit: Payer: Self-pay | Admitting: *Deleted

## 2018-10-24 ENCOUNTER — Other Ambulatory Visit: Payer: Self-pay | Admitting: *Deleted

## 2018-10-24 NOTE — Patient Outreach (Signed)
[facility]) Care Management  10/24/2018  DIERDRE MCCALIP [birth year] [phone]   RN Health Coach attempted follow up outreach call to patient.  Patient was unavailable. HIPPA compliance voicemail message left with return callback number.  Plan: RN will call patient again within 30 days.  [facility] Care Management [phone]

## 2018-10-25 ENCOUNTER — Telehealth: Payer: Self-pay | Admitting: Orthopedic Surgery

## 2018-10-25 ENCOUNTER — Other Ambulatory Visit: Payer: Self-pay | Admitting: *Deleted

## 2018-10-25 ENCOUNTER — Telehealth: Payer: Self-pay | Admitting: Neurology

## 2018-10-25 ENCOUNTER — Other Ambulatory Visit: Payer: Self-pay

## 2018-10-25 DIAGNOSIS — M1A0720 Idiopathic chronic gout, left ankle and foot, without tophus (tophi): Secondary | ICD-10-CM

## 2018-10-25 MED ORDER — FEBUXOSTAT 40 MG PO TABS: 40.0000 mg | ORAL_TABLET | Freq: Every day | ORAL | 3 refills | Status: DC

## 2018-10-25 MED ORDER — PRAMIPEXOLE DIHYDROCHLORIDE 0.25 MG PO TABS: 0.2500 mg | ORAL_TABLET | ORAL | 1 refills | Status: DC

## 2018-10-25 NOTE — Telephone Encounter (Signed)
Can you enter rx for uloric for this pt [facility] please?

## 2018-10-25 NOTE — Telephone Encounter (Signed)
[facility].

## 2018-10-25 NOTE — Telephone Encounter (Signed)
Patient spouse [name] called stated that patient needs a refill of Febuxostat that needs to go to [facility] [phone].  Please call patient to advise  [phone]

## 2018-10-25 NOTE — Patient Outreach (Signed)
[facility]) Care Management  10/25/2018   [name] 07-14-1940 [id number]  [place] received return telephone call from patient.  [employer] compliance verified.  Per patient she is doing good. She is trying to eat healthy. She is using little to no salt. She has had some shingles recently that has caused pain. Patient stated she is weighing and it is better that it has been in years. She has not had any recent falls even though she is have some balance problems. Patient has agreed to follow up outreach calls.    Current Medications:  Current Outpatient Medications  Medication Sig Dispense Refill  . bumetanide (BUMEX) 2 MG tablet Take 1 tablet (2 mg total) by mouth 2 (two) times daily. 60 tablet 0  . Calcium Carb-Cholecalciferol (CALCIUM 600+D3) 600-800 MG-UNIT TABS Take 1 tablet by mouth daily.    . cephALEXin (KEFLEX) 500 MG capsule Take 1 capsule (500 mg total) by mouth 4 (four) times daily. 28 capsule 0  . clopidogrel (PLAVIX) 75 MG tablet TAKE 1 TABLET DAILY (Patient taking differently: Take 75 mg by mouth daily. ) 90 tablet 4  . COD LIVER OIL PO Take 415 mg by mouth daily.    . colchicine 0.6 MG tablet Take 1 tablet (0.6 mg total) by mouth daily. PRN gout flare (Patient taking differently: Take 0.6 mg by mouth daily as needed (for foot pain/gout flare up x 3 days then discontinue). ) 90 tablet 3  . Cranberry 500 MG TABS Take 500 mg by mouth every evening.    . diazepam (VALIUM) 5 MG tablet Take 1 tablet (5 mg total) by mouth 2 (two) times daily as needed for anxiety. 10 tablet 0  . exemestane (AROMASIN) 25 MG tablet TAKE 1 TABLET DAILY AFTER BREAKFAST (Patient taking differently: Take 25 mg by mouth daily. ) 90 tablet 4  . febuxostat (ULORIC) 40 MG tablet Take 1 tablet (40 mg total) by mouth daily. 30 tablet 3  . gabapentin (NEURONTIN) 300 MG capsule Take 300-600 mg by mouth daily as needed (nerve pain).    Marland Kitchen gabapentin (NEURONTIN) 300 MG capsule Take 1 capsule  (300 mg total) by mouth 2 (two) times daily. Take 1 during the day if needed and 2 at night 270 capsule 1  . insulin NPH Human (HUMULIN N,NOVOLIN N) 100 UNIT/ML injection Inject 20-25 Units into the skin See admin instructions. Inject 20 units in the morning & inject 25 units after supper.    Javier Docker Oil 500 MG CAPS Take 500 mg by mouth daily.    . Methylfol-Methylcob-Acetylcyst (CEREFOLIN NAC) 6-2-600 MG TABS TAKE 1 CAPLET BY MOUTH ONCE DAILY. 90 each 3  . metolazone (ZAROXOLYN) 2.5 MG tablet TAKE 1 TABLET DAILY 30 MINUTES BEFORE THE MORNING DOSE OF BUMEX AS DIRECTED 90 tablet 1  . metoprolol succinate (TOPROL-XL) 25 MG 24 hr tablet Take 0.5 tablets (12.5 mg total) by mouth every evening. 30 tablet 0  . midodrine (PROAMATINE) 5 MG tablet Take 1 tablet (5 mg total) by mouth 3 (three) times daily with meals. 90 tablet 0  . mirtazapine (REMERON) 15 MG tablet Take 15 mg by mouth at bedtime. Taking 1 1/2 tablet at bedtime    . nitroGLYCERIN (NITROSTAT) 0.4 MG SL tablet Place 0.4 mg under the tongue every 5 (five) minutes as needed for chest pain.    Marland Kitchen oxyCODONE-acetaminophen (PERCOCET/ROXICET) 5-325 MG tablet Take 1 tablet by mouth every 6 (six) hours as needed for severe pain. 28 tablet  0  . pantoprazole (PROTONIX) 40 MG tablet TAKE 1 TABLET DAILY (CONTACT OFFICE FOR ADDITIONAL REFILLS, FINAL ATTEMPT) (Patient taking differently: Take 40 mg by mouth daily before breakfast. ) 90 tablet 0  . potassium chloride SA (K-DUR) 20 MEQ tablet Take 4 tablets (80 mEq total) by mouth 2 (two) times daily for 7 days. Then return to previous dosing 56 tablet 0  . pramipexole (MIRAPEX) 0.25 MG tablet Take 1-2 tablets (0.25-0.5 mg total) by mouth See admin instructions. TAKE 1 TABLET AT 4:00 P.M. AND 2 TABLETS (0.5 MG) AT BEDTIME 270 tablet 1  . rosuvastatin (CRESTOR) 20 MG tablet Take 20 mg by mouth daily with supper.     . sennosides-docusate sodium (SENOKOT-S) 8.6-50 MG tablet Take 1 tablet by mouth daily as needed for  constipation.    Marland Kitchen Ubiquinol (QUNOL COQ10/UBIQUINOL/MEGA) 100 MG CAPS Take 100 mg by mouth every evening. CoQ10, Qunol      No current facility-administered medications for this visit.     Functional Status:  In your present state of health, do you have any difficulty performing the following activities: 10/25/2018 07/24/2018  Hearing? N N  Vision? N N  Difficulty concentrating or making decisions? Y N  Comment Patient does have some short term memory loss -  Walking or climbing stairs? Y Y  Comment Uses a cane at all times gait unsteady/ patient has neuropathy  Dressing or bathing? Y Y  Comment Patient husban assists with bathing and washing hair daughter assists with showering  Doing errands, shopping? Y Y  Comment Husband assists with all Husband and daughters does all  errands  Preparing Food and eating ? Y Y  Comment Husband fixes patient her meals husbamd assists  Using the Toilet? N N  In the past six months, have you accidently leaked urine? N N  Do you have problems with loss of bowel control? N N  Managing your Medications? Y Y  Comment Husband fixes medications for patient due to patient memory impairment Husband sets up medications  Managing your Finances? [name]  Comment Husband caregiver Husband takes care of  Housekeeping or managing your Housekeeping? [name]  Comment husband manages Husband assists  Some recent data might be hidden    Fall/Depression Screening: Fall Risk  10/25/2018 07/24/2018 04/06/2018  Falls in the past year? 1 0 1  Number falls in past yr: 1 - 1  Comment this patient has had 3 falls within the last 3 months - -  Injury with Fall? 0 - 1  Risk for fall due to : History of fall(s);Impaired balance/gait;Impaired mobility Impaired balance/gait;Impaired mobility Impaired mobility;Impaired balance/gait  Follow up Falls evaluation completed;Education provided;Falls prevention discussed - -   PHQ 2/9 Scores 10/25/2018 07/24/2018 03/31/2018 08/23/2017 03/21/2017   PHQ - 2 Score 0 0 0 1 1   [facility] CM Care Plan Problem One     Most Recent Value  Care Plan Problem One  Knowledge Deficit in Self Management of Congestive Heart Failure  Role Documenting the Problem One  [place] for Problem One  Active  [facility] Long Term Goal   Patient will follow up with health maintenance within the next 90 days  [facility] Long Term Goal Start Date  10/25/18      Assessment:  Patient is doing good She is weighing daily Eating healthy very little to no salt in foods Fresh and frozen vegetables Patient has adequate medication and foods during pandemic No recent falls Plan:  Patient will continue to weigh daily Patient will continue to take medications as directed RN discussed Health Maintenance Patient will follow up with Health Maintenance care RN will follow up outreach within the month of October  Akaysha Cobern [place] [facility] [phone]

## 2018-10-25 NOTE — Telephone Encounter (Signed)
Pt is needing a refill on her pramipexole (MIRAPEX) 0.25 MG tablet sent to [facility]. Please do not call the [facility] on [address]. They do not need to be informed only [facility] needs to be called.

## 2018-10-26 ENCOUNTER — Other Ambulatory Visit: Payer: Self-pay

## 2018-10-26 ENCOUNTER — Ambulatory Visit
Admission: RE | Admit: 2018-10-26 | Discharge: 2018-10-26 | Disposition: A | Discharge status: Home / Self Care | Payer: Medicare Other | Source: Ambulatory Visit | Attending: Hematology | Admitting: Hematology

## 2018-10-26 DIAGNOSIS — C50412 Malignant neoplasm of upper-outer quadrant of left female breast: Secondary | ICD-10-CM

## 2018-10-26 DIAGNOSIS — C50211 Malignant neoplasm of upper-inner quadrant of right female breast: Secondary | ICD-10-CM

## 2018-10-26 DIAGNOSIS — Z170 Estrogen receptor positive status [ER+]: Secondary | ICD-10-CM

## 2018-10-26 DIAGNOSIS — R921 Mammographic calcification found on diagnostic imaging of breast: Secondary | ICD-10-CM | POA: Diagnosis not present

## 2018-10-26 DIAGNOSIS — E876 Hypokalemia: Secondary | ICD-10-CM | POA: Diagnosis not present

## 2018-10-26 HISTORY — DX: Personal history of irradiation: Z92.3

## 2018-11-14 DIAGNOSIS — E1165 Type 2 diabetes mellitus with hyperglycemia: Secondary | ICD-10-CM | POA: Diagnosis not present

## 2018-11-14 DIAGNOSIS — I10 Essential (primary) hypertension: Secondary | ICD-10-CM | POA: Diagnosis not present

## 2018-11-14 DIAGNOSIS — N189 Chronic kidney disease, unspecified: Secondary | ICD-10-CM | POA: Diagnosis not present

## 2018-11-14 DIAGNOSIS — E7800 Pure hypercholesterolemia, unspecified: Secondary | ICD-10-CM | POA: Diagnosis not present

## 2018-11-14 DIAGNOSIS — G609 Hereditary and idiopathic neuropathy, unspecified: Secondary | ICD-10-CM | POA: Diagnosis not present

## 2018-11-27 DIAGNOSIS — E782 Mixed hyperlipidemia: Secondary | ICD-10-CM | POA: Diagnosis not present

## 2018-11-27 DIAGNOSIS — I10 Essential (primary) hypertension: Secondary | ICD-10-CM | POA: Diagnosis not present

## 2018-11-27 DIAGNOSIS — E119 Type 2 diabetes mellitus without complications: Secondary | ICD-10-CM | POA: Diagnosis not present

## 2018-11-27 DIAGNOSIS — N183 Chronic kidney disease, stage 3 (moderate): Secondary | ICD-10-CM | POA: Diagnosis not present

## 2018-11-28 NOTE — Progress Notes (Signed)
[place]   [phone] [phone]   Clinic Follow up Note   Patient Care Team: Redmond School, MD as PCP - General (Internal Medicine) Leonie Man, MD as PCP - Cardiology (Cardiology) Elmarie Shiley, MD as Consulting Physician (Nephrology) Leonie Man, MD as Consulting Physician (Cardiology) Alphonsa Overall, MD as Consulting Physician (General Surgery) Kyung Rudd, MD as Consulting Physician (Radiation Oncology) Truitt Merle, MD as Consulting Physician (Hematology) Delice Bison Charlestine Massed, NP as Nurse Practitioner (Hematology and Oncology) Pleasant, Eppie Gibson, RN as [place] Management 11/29/2018  I connected with [name] on 11/29/18 at 11:15 AM EDT by telephone visit and verified that I am speaking with the correct person using two identifiers.   I discussed the limitations, risks, security and privacy concerns of performing an evaluation and management service by telemedicine and the availability of in-person appointments. I also discussed with the patient that there may be a patient responsible charge related to this service. The patient expressed understanding and agreed to proceed.   Other persons participating in the visit and their role in the encounter: Spouse, [name]  Patients location: Home  Providers location: [facility] office   Chief Complaint: Follow-up bilateral breast cancer   SUMMARY OF ONCOLOGIC HISTORY: Oncology History Overview Note  Cancer Staging Malignant 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, estrogen receptor positive ([place]) Staging form: Breast, AJCC 8th Edition - Clinical: Stage IA (cT1c, cN0, cM0, G1, ER: Positive, PR: Positive, HER2: Negative) - Unsigned     Malignant 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, estrogen receptor positive ([place])  10/20/2016 Initial Biopsy   Diagnosis Breast, right, needle core biopsy, 11:30 o'clock - INVASIVE DUCTAL  CARCINOMA,   10/20/2016 Initial Diagnosis   Malignant 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, estrogen receptor positive ([place])   10/20/2016 Receptors her2   Estrogen Receptor: 100%, POSITIVE, STRONG STAINING INTENSITY Progesterone Receptor: 100%, POSITIVE, STRONG STAINING INTENSITY Proliferation Marker Ki67: 5% HER2 NEGATIVE    10/20/2016 Mammogram   Diagnostic mammogram showed persistent distortion corresponds to a vaguely palpable irregular mass and is suspicious for malignancy.Targeted ultrasound is performed, showing an irregular hypoechoic mass with posterior acoustic shadowing in the 11:30 o'clock location of the right breast 4 cm from the nipple which measures 0.9 x 0.9 x0.8 cm. There is associated internal vascularity. Evaluation of the axilla is negative for adenopathy.   11/01/2016 Imaging   B/l BREAST MRI: IMPRESSION: 1. There is a 1.5 cm biopsy proven malignancy in the upper slightly outer quadrant of the right breast. No additional sites of disease are identified.  2. There is an indeterminate 6 mm enhancing mass in the upper-outer left breast.   11/11/2016 Pathology Results   Diagnosis Breast, left, needle core biopsy, upper outer - INVASIVE DUCTAL CARCINOMA. - ATYPICAL LOBULAR HYPERPLASIA. - FIBROCYSTIC CHANGES AND PSEUDOANGIOMATOUS STROMAL HYPERPLASIA ([place]).   11/11/2016 Receptors her2    Estrogen Receptor: 100%, POSITIVE, STRONG STAINING INTENSITY Progesterone Receptor: 10%, POSITIVE, WEAK STAINING INTENSITY Proliferation Marker Ki67: 2% HER2 NEGATIVE    12/03/2016 Genetic Testing   Germline genetic testing was performed through [employer]'s Common Hereditary Cancers Panel + [employer]'s Melanoma Panel. This custom panel includes analysis of the following 51 genes: APC, ATM, AXIN2, BAP1, BARD1, BMPR1A, BRCA1, BRCA2, BRIP1, CDH1, CDK4, CDKN2A, CHEK2, CTNNA1, DICER1, EPCAM, GREM1, HOXB13, KIT, MEN1, MITF, MLH1, MSH2, MSH3, MSH6, MUTYH, NBN, NF1, NTHL1,  PALB2, PDGFRA, PMS2, POLD1, POLE, POT1, PTEN, RAD50, RAD51C, RAD51D, RB1,  SDHA, SDHB, SDHC, SDHD, SMAD4, SMARCA4, STK11, TP53, TSC1, TSC2, and VHL.  A variant of uncertain significance (VUS) was noted in BARD1.    12/14/2016 Miscellaneous   Oncotype Recurrence score of 16 10-year risk of distant recurrence with Tamoxifen alone is 10%   12/14/2016 Surgery   BILATERAL BREAST LUMPECTOMY WITH BILATERAL  RADIOACTIVE SEED AND BILATERAL AXILLARY  SENTINEL LYMPH NODE BIOPSY ERAS PATHWAY by Dr. Lucia Gaskins on 12/14/16   12/14/2016 Pathology Results   Diagnosis 12/14/16 1. Breast, lumpectomy, Left w/seed - INVASIVE DUCTAL CARCINOMA, 1 CM. - ATYPICAL LOBULAR HYPERPLASIA ([place]). - PREVIOUS BIOPSY SITE. - INVASIVE CARCINOMA 0.2 CM FROM LATERAL MARGIN. - ATYPICA LOBULAR HYPERPLASIA LESS THAN 0.1 CM FROM LATERAL MARGIN. 2. Breast, lumpectomy, Right w/seed - INVASIVE DUCTAL CARCINOMA, 1.1 CM. - FIBROCYSTIC CHANGES. - PREVIOUS BIOPSY SITE. - INVASIVE CARCINOMA 1.1 CM FROM POSTERIOR MARGIN. 3. Lymph node, sentinel, biopsy, Right Axillary Contents - BENIGN FIBROADIPOSE TISSUE. - NO LYMPH NODE TISSUE OR MALIGNANCY. 4. Lymph node, sentinel, biopsy, Left Axillary - ONE BENIGN LYMPH NODE (0/1).    01/17/2017 - 02/11/2017 Radiation Therapy   1. The Left breast was treated to 42.5 Gy in 17 fractions of 2.5 Gy.  2. The Right breast was treated to 42.5 Gy in 17 fractions of 2.5 Gy.  3. The Right breast was boosted to 7.5 Gy in 3 fractions of 2.5 Gy.  4. The Left breast was boosted to 7.5 Gy in 3 fractions of 2.5 Gy.       02/2017 -  Anti-estrogen oral therapy   Exemestane daily     CURRENT THERAPY: Adjuvant Exemestane started on 03/07/17  INTERVAL HISTORY: [name] presents for virtual/phone visit for follow-up bilateral breast cancer.  On 08/14/2018.  She had a mammogram on 10/26/2018 which was negative.  Breast density category C. She is doing well. Her feet are hurting, says she can feel metal piece in her  heel. Her feet are also "turned out." Knees are also sore from arthritis. She has wheelchair, walker, and cane she uses at times for assistance. Husband [name] also helps her, he was on today's call. She tries to maintain activity and keeps her house together. Elevates legs for intermittent feet swelling. She has occasional right axillary and breast pain with limited ROM. She notes this has been there since she had shingles at same time as breast cancer diagnosis. Denies breast lump or nipple change. Denies hot flash. She has low appetite at baseline, on Mirtazapine. Denies fever, chills, chest pain, dyspnea, change in bowel habits, bleeding. Has a dry intermittent cough.    MEDICAL HISTORY:  Past Medical History:  Diagnosis Date   Anemia    have received iron infusions   Anxiety    Arthritis     osteoarthritis   Breast cancer ([place])    Breast cancer in female [facility])    Bilateral   CAD S/P percutaneous coronary angioplasty 2007; March 2011   [employer] 3.0 mm 20 mm postdilated 3.6 mm in early mid LAD; status post ISR Cutting Balloon PTCA and March 11 along with PCI of distal mid lesion with a 3.0 mm 12 mm MultiLink vision BMS; the proximal stent causes jailing of SP1 and SP2 with ostial 70-80% lesions   Cancer (HCC)    Melanoma, Squamous cell Carcinoma   CHF (congestive heart failure) (HCC)    Chronic kidney disease    stage 2    CKD (chronic kidney disease) stage 4, GFR 15-29 ml/min ([place]) 11/17/2015  Dementia ([place])    mild   Diabetes mellitus type 2 with neurological manifestations ([place])    Diabetes mellitus without complication ([place])    Dyslipidemia, goal LDL below 70    Full dentures    Genetic testing 12/03/2016   Germline genetic testing was performed through [employer]'s Common Hereditary Cancers Panel + [employer]'s Melanoma Panel. This custom panel includes analysis of the following 51 genes: APC, ATM, AXIN2, BAP1, BARD1, BMPR1A, BRCA1, BRCA2, BRIP1, CDH1, CDK4, CDKN2A,  CHEK2, CTNNA1, DICER1, EPCAM, GREM1, HOXB13, KIT, MEN1, MITF, MLH1, MSH2, MSH3, MSH6, MUTYH, NBN, NF1, NTHL1, PALB2, PDGFRA, PMS2, POLD1, POL   GERD (gastroesophageal reflux disease)    Headache(784.0)    History of hematuria    Followed by Dr. Gaynelle Arabian   Hypertension, essential, benign    Nausea & vomiting 10/2015   Peripheral neuropathy    Personal history of radiation therapy    Stroke ([place])    TIA (transient ischemic attack)    multiple in the past   Wears glasses     SURGICAL HISTORY: Past Surgical History:  Procedure Laterality Date   ABDOMINAL HYSTERECTOMY     ANKLE FUSION Right 02/22/2018   Procedure: RIGHT SUBTALAR AND TALONAVICULAR FUSION;  Surgeon: Newt Minion, MD;  Location: [place];  Service: Orthopedics;  Laterality: Right;   APPENDECTOMY     BLADDER SUSPENSION     BREAST LUMPECTOMY Bilateral 2018   BREAST LUMPECTOMY WITH RADIOACTIVE SEED AND SENTINEL LYMPH NODE BIOPSY Bilateral 12/14/2016   Procedure: BILATERAL BREAST LUMPECTOMY WITH BILATERAL  RADIOACTIVE SEED AND BILATERAL AXILLARY  SENTINEL LYMPH NODE BIOPSY ERAS PATHWAY;  Surgeon: Alphonsa Overall, MD;  Location: [place];  Service: General;  Laterality: Bilateral;  [place]  02/24/2006   85% stenosis in the proximal portion of LAD-arrangements made for PCI on 02/25/2006   CARDIAC CATHETERIZATION  02/25/2006   80% LAD lesion stented with a 3x48m Liberte stent resulting in reduction of 80% lesion to 0% residual   CARDIAC CATHETERIZATION  04/26/2006   Medical management   CARDIAC CATHETERIZATION  06/24/2009   60-70% re-stenosis in the proximal LAD. A 3.25x15 cutting balloon, 3 inflations - 14atm-38sec, 13atm-39sec, and 12atm-40sec reduced to less than 10%. 60% stenosis of the mid/distal LAD stented with a 3x178mMultilink stent.   CARDIAC CATHETERIZATION  07/09/2009   Medical management   CAROTID DOPPLER  06/24/2009   40-59% R ICA stenosis. No significant ICA  stenosis noted   CHOLECYSTECTOMY     DILATION AND CURETTAGE OF UTERUS     JOINT REPLACEMENT Left    LESION REMOVAL Right 03/11/2015   Procedure:  EXCISION OF RIGHT PRE TIBIAL LESION;  Surgeon: JaJudeth HornMD;  [facility] Service: General;  Laterality: Right;   MASS EXCISION Left 06/10/2014   Procedure: EXCISION LEFT LOWER LEG LESION;  Surgeon: JaDoreen SalvageMD;  [facility] Service: General;  Laterality: Left;   MASS EXCISION Left 09/05/2015   Procedure: EXCISION OF LEFT FOREARM  MASS;  Surgeon: JaJudeth HornMD;  [facility] Service: General;  Laterality: Left;   NM MYOVIEW LTD  11/16/2011   5 beats a PVC during recovery.  No skin or infarction.  Reached 5 METs, EKG negative for ischemia    NM MYOVIEW LTD  10/2011   No ischemia or infarction.  Normal EF.  LOW RISK. (Short bursts of 5 beats NSVT during recovery otherwise normal)   SHOULDER ARTHROSCOPY  10/15   SHOULDER ARTHROSCOPY  1995   left   TEE WITHOUT CARDIOVERSION N/A 08/03/2017   Procedure: TRANSESOPHAGEAL ECHOCARDIOGRAM (TEE);  Surgeon: Skeet Latch, MD;  Location: [place];  Service: Cardiovascular;; EF 60-65% with no wall motion normalities.  No atrial thrombus noted.  Lightly findings consistent with a lipomatous hypertrophy of the atrial septum.    TENDON REPAIR Left 05/12/2016   Procedure: Left Anterior Tibial Tendon Reconstruction;  Surgeon: Newt Minion, MD;  Location: [place];  Service: Orthopedics;  Laterality: Left;   TOTAL KNEE ARTHROPLASTY  2005   left   TRANSESOPHAGEAL ECHOCARDIOGRAM  08/03/2016   : EF 60-65% no wall motion normalities.  No atrial thrombus.  Lipomatous hypertrophy of the atrial septum.  No mass noted.   TRANSTHORACIC ECHOCARDIOGRAM  10/2015   EF 55-60 %. Mild LVH. Mild pulmonary hypertension. Normal valves.   TRANSTHORACIC ECHOCARDIOGRAM  07/2017   Vigorous LV function.  EF 65-70%.  Mild diastolic dysfunction  with no RWMA. GR 1 DD.  Mild aortic sclerosis/no stenosis.  Questionable right atrial mass discounted with TEE)    WRIST ARTHROPLASTY  2010   cancer lt wrist    I have reviewed the social history and family history with the patient and they are unchanged from previous note.  ALLERGIES:  is allergic to ultram [tramadol]; lipitor [atorvastatin]; lyrica [pregabalin]; reglan [metoclopramide]; and nsaids.  MEDICATIONS:  Current Outpatient Medications  Medication Sig Dispense Refill   bumetanide (BUMEX) 2 MG tablet Take 1 tablet (2 mg total) by mouth 2 (two) times daily. 60 tablet 0   Calcium Carb-Cholecalciferol (CALCIUM 600+D3) 600-800 MG-UNIT TABS Take 1 tablet by mouth daily.     clopidogrel (PLAVIX) 75 MG tablet TAKE 1 TABLET DAILY (Patient taking differently: Take 75 mg by mouth daily. ) 90 tablet 4   COD LIVER OIL PO Take 415 mg by mouth daily.     colchicine 0.6 MG tablet Take 1 tablet (0.6 mg total) by mouth daily. PRN gout flare (Patient taking differently: Take 0.6 mg by mouth daily as needed (for foot pain/gout flare up x 3 days then discontinue). ) 90 tablet 3   Cranberry 500 MG TABS Take 500 mg by mouth every evening.     diazepam (VALIUM) 5 MG tablet Take 1 tablet (5 mg total) by mouth 2 (two) times daily as needed for anxiety. 10 tablet 0   exemestane (AROMASIN) 25 MG tablet TAKE 1 TABLET DAILY AFTER BREAKFAST (Patient taking differently: Take 25 mg by mouth daily. ) 90 tablet 4   febuxostat (ULORIC) 40 MG tablet Take 1 tablet (40 mg total) by mouth daily. 90 tablet 3   gabapentin (NEURONTIN) 300 MG capsule Take 300-600 mg by mouth daily as needed (nerve pain).     gabapentin (NEURONTIN) 300 MG capsule Take 1 capsule (300 mg total) by mouth 2 (two) times daily. Take 1 during the day if needed and 2 at night 270 capsule 1   insulin NPH Human (HUMULIN N,NOVOLIN N) 100 UNIT/ML injection Inject 20-25 Units into the skin See admin instructions. Inject 20 units in the morning  & inject 25 units after supper.     Krill Oil 500 MG CAPS Take 500 mg by mouth daily.     Methylfol-Methylcob-Acetylcyst (CEREFOLIN NAC) 6-2-600 MG TABS TAKE 1 CAPLET BY MOUTH ONCE DAILY. 90 each 3   metolazone (ZAROXOLYN) 2.5 MG tablet TAKE 1 TABLET DAILY 30 MINUTES BEFORE THE MORNING DOSE OF BUMEX AS DIRECTED 90 tablet 1   metoprolol  succinate (TOPROL-XL) 25 MG 24 hr tablet Take 0.5 tablets (12.5 mg total) by mouth every evening. 30 tablet 0   midodrine (PROAMATINE) 5 MG tablet Take 1 tablet (5 mg total) by mouth 3 (three) times daily with meals. 90 tablet 0   mirtazapine (REMERON) 15 MG tablet Take 15 mg by mouth at bedtime. Taking 1 1/2 tablet at bedtime     pantoprazole (PROTONIX) 40 MG tablet TAKE 1 TABLET DAILY (CONTACT OFFICE FOR ADDITIONAL REFILLS, FINAL ATTEMPT) (Patient taking differently: Take 40 mg by mouth daily before breakfast. ) 90 tablet 0   potassium chloride SA (K-DUR) 20 MEQ tablet Take 4 tablets (80 mEq total) by mouth 2 (two) times daily for 7 days. Then return to previous dosing 56 tablet 0   pramipexole (MIRAPEX) 0.25 MG tablet Take 1-2 tablets (0.25-0.5 mg total) by mouth See admin instructions. TAKE 1 TABLET AT 4:00 P.M. AND 2 TABLETS (0.5 MG) AT BEDTIME 270 tablet 1   rosuvastatin (CRESTOR) 20 MG tablet Take 20 mg by mouth daily with supper.      cephALEXin (KEFLEX) 500 MG capsule Take 1 capsule (500 mg total) by mouth 4 (four) times daily. 28 capsule 0   nitroGLYCERIN (NITROSTAT) 0.4 MG SL tablet Place 0.4 mg under the tongue every 5 (five) minutes as needed for chest pain.     oxyCODONE-acetaminophen (PERCOCET/ROXICET) 5-325 MG tablet Take 1 tablet by mouth every 6 (six) hours as needed for severe pain. 28 tablet 0   sennosides-docusate sodium (SENOKOT-S) 8.6-50 MG tablet Take 1 tablet by mouth daily as needed for constipation.     Ubiquinol (QUNOL COQ10/UBIQUINOL/MEGA) 100 MG CAPS Take 100 mg by mouth every evening. CoQ10, Qunol      No current  facility-administered medications for this visit.     PHYSICAL EXAMINATION: No vitals for this visit.  Patient appears well over the phone. Speech is intact and non pressured. Mood and affect appear normal.    LABORATORY DATA:  No labs for this visit    RADIOGRAPHIC STUDIES: I have personally reviewed the radiological images as listed and agreed with the findings in the report. No results found.   ASSESSMENT & PLAN: GINIA RUDELL is a 78 y.o. 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 She is s/p b/l breast lumpectomy and has adjuvant radiation.  - Her Oncotype results returned low risk, recurrence score 16, which predicts 10-year of distant recurrence 10% with tamoxifen, she did not need chemotherapy.  -She started antiestrogen therapy with Exemestane in 02/2017. She is tolerating well overall -mammogram 09/2018 negative for malignancy  2. Genetics -She is positive for variant of uncertain significance identified in [place]. Negative for other gene mutations.   3. HTN, DM, CAD and CHF with LE edema  -She'll continue medication and follow-up with her primary care physician -per patient last HgA1c 6.1, BG range 85 - >200 -continue f/u withnephrologist [name], and her cardiologist [name]  4. CKD, Stage IV -f/u [name]   5. Arthritis,balance issue  -Probably related to her DM -She underwent right foot surgery in 01/2018. She had sepsis afterward. She has completed PT.  -uses assistive devices -continue f/u with ortho   6. Osteopenia  -2016 bone density, right femur shows osteopenia  -DEXA from 02/24/17 results with a T score of -1.6 -continues calcium and vitamin D -  Next DEXA in 01/2019, order placed today  Disposition: Ms. Trussell appears well. She had a mammogram on 09/2018 which  was negative for malignancy. She continues exemestane, which she tolerates well. She has foot and knee pain, related to arthritis and other factors. Pain is manageable overall, no other significant [name] effects from AI. She will continue. She is due repeat DEXA in 01/2019, I placed the order today. She is hesitant to come in due to [place]. I reviewed the importance of routine breast exam. Will schedule a lab and f/u with breast exam in 3-4 months. She may wish to convert to phone visit again if COVID19 situation is not under control. She knows to call in the interim if she has any new concerns especially related to her breasts.    Orders Placed This Encounter  Procedures   DG Bone Density    MCR/ [employer] Pf: 02/24/17'@bcg'$  Wt:  Pt advised to stop cali- multi vitms 48 hrs prior     Standing Status:   Future    Standing Expiration Date:   11/29/2019    Order Specific Question:   Reason for Exam (SYMPTOM  OR DIAGNOSIS REQUIRED)    Answer:   osteopenia    Order Specific Question:   Preferred imaging location?    Answer:   [facility]   All questions were answered. The patient knows to call the clinic with any problems, questions or concerns. No barriers to learning was detected. I spent 20 minutes non-face to face time in today's encounter and more than 50% was on counseling and review of test results     Alla Feeling, NP 11/29/18

## 2018-11-29 ENCOUNTER — Telehealth: Payer: Self-pay | Admitting: Nurse Practitioner

## 2018-11-29 ENCOUNTER — Inpatient Hospital Stay: Payer: Medicare Other | Attending: Hematology | Admitting: Nurse Practitioner

## 2018-11-29 ENCOUNTER — Inpatient Hospital Stay: Payer: Medicare Other

## 2018-11-29 ENCOUNTER — Encounter: Payer: Self-pay | Admitting: Nurse Practitioner

## 2018-11-29 ENCOUNTER — Other Ambulatory Visit: Payer: Self-pay

## 2018-11-29 DIAGNOSIS — E2839 Other primary ovarian failure: Secondary | ICD-10-CM | POA: Diagnosis not present

## 2018-11-29 DIAGNOSIS — M8580 Other specified disorders of bone density and structure, unspecified site: Secondary | ICD-10-CM

## 2018-11-29 DIAGNOSIS — C50412 Malignant neoplasm of upper-outer quadrant of left female breast: Secondary | ICD-10-CM | POA: Diagnosis not present

## 2018-11-29 DIAGNOSIS — Z170 Estrogen receptor positive status [ER+]: Secondary | ICD-10-CM | POA: Diagnosis not present

## 2018-11-29 NOTE — Telephone Encounter (Signed)
Scheduled appt per 9/2 los.  Sent a staff messgae to get a calendar mailed out.

## 2018-12-03 IMAGING — DX DG CHEST 2V
2 series · 2 of 2 positions shown · non-contrast
Comparison: Chest x-ray 04/18/2015, 09/02/2015, 06/07/2014.

CLINICAL DATA: Productive cough. Congestion. Shortness of breath.
Lower extremity swelling. History of breast cancer.

EXAM:
CHEST - 2 VIEW

[chest pa]
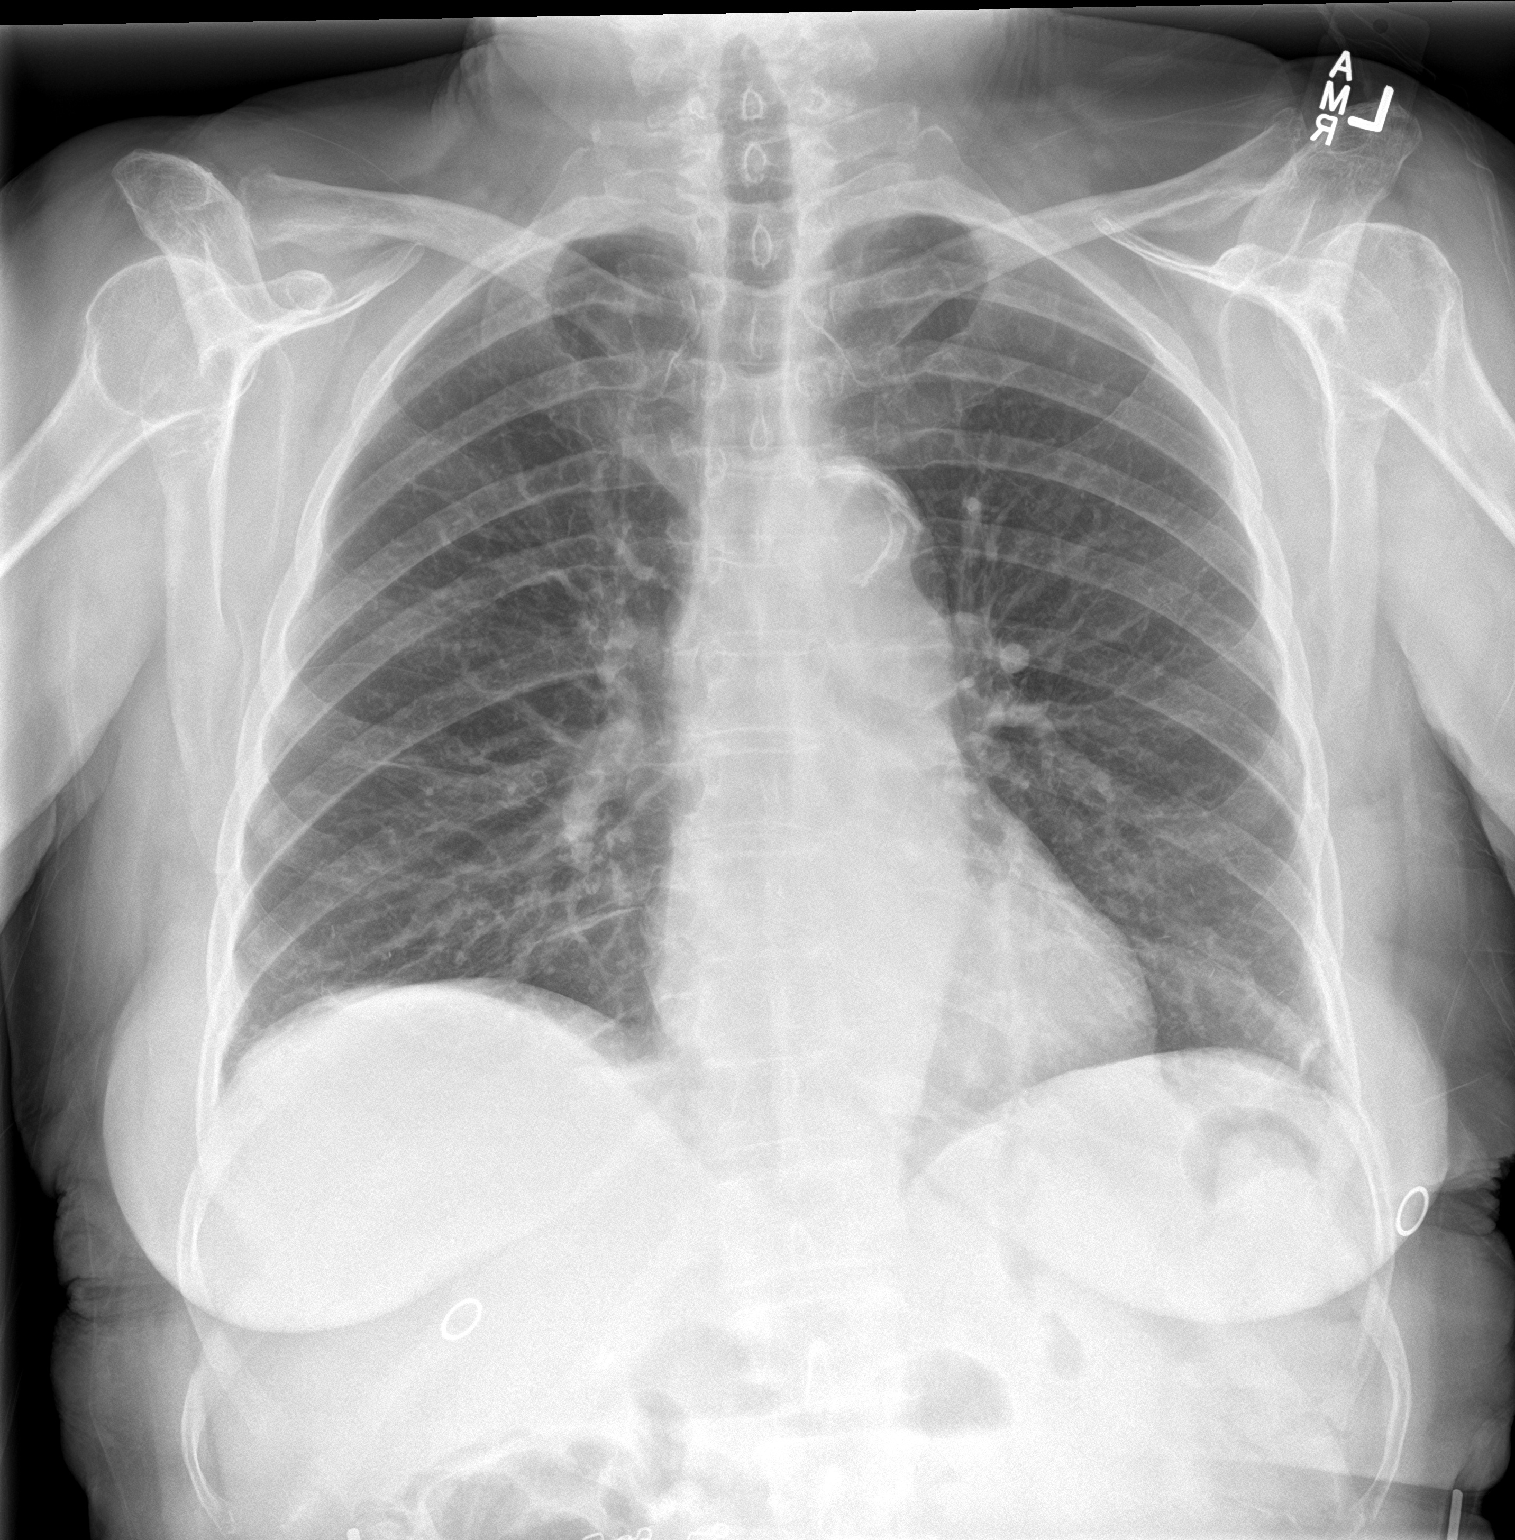

[chest lat]
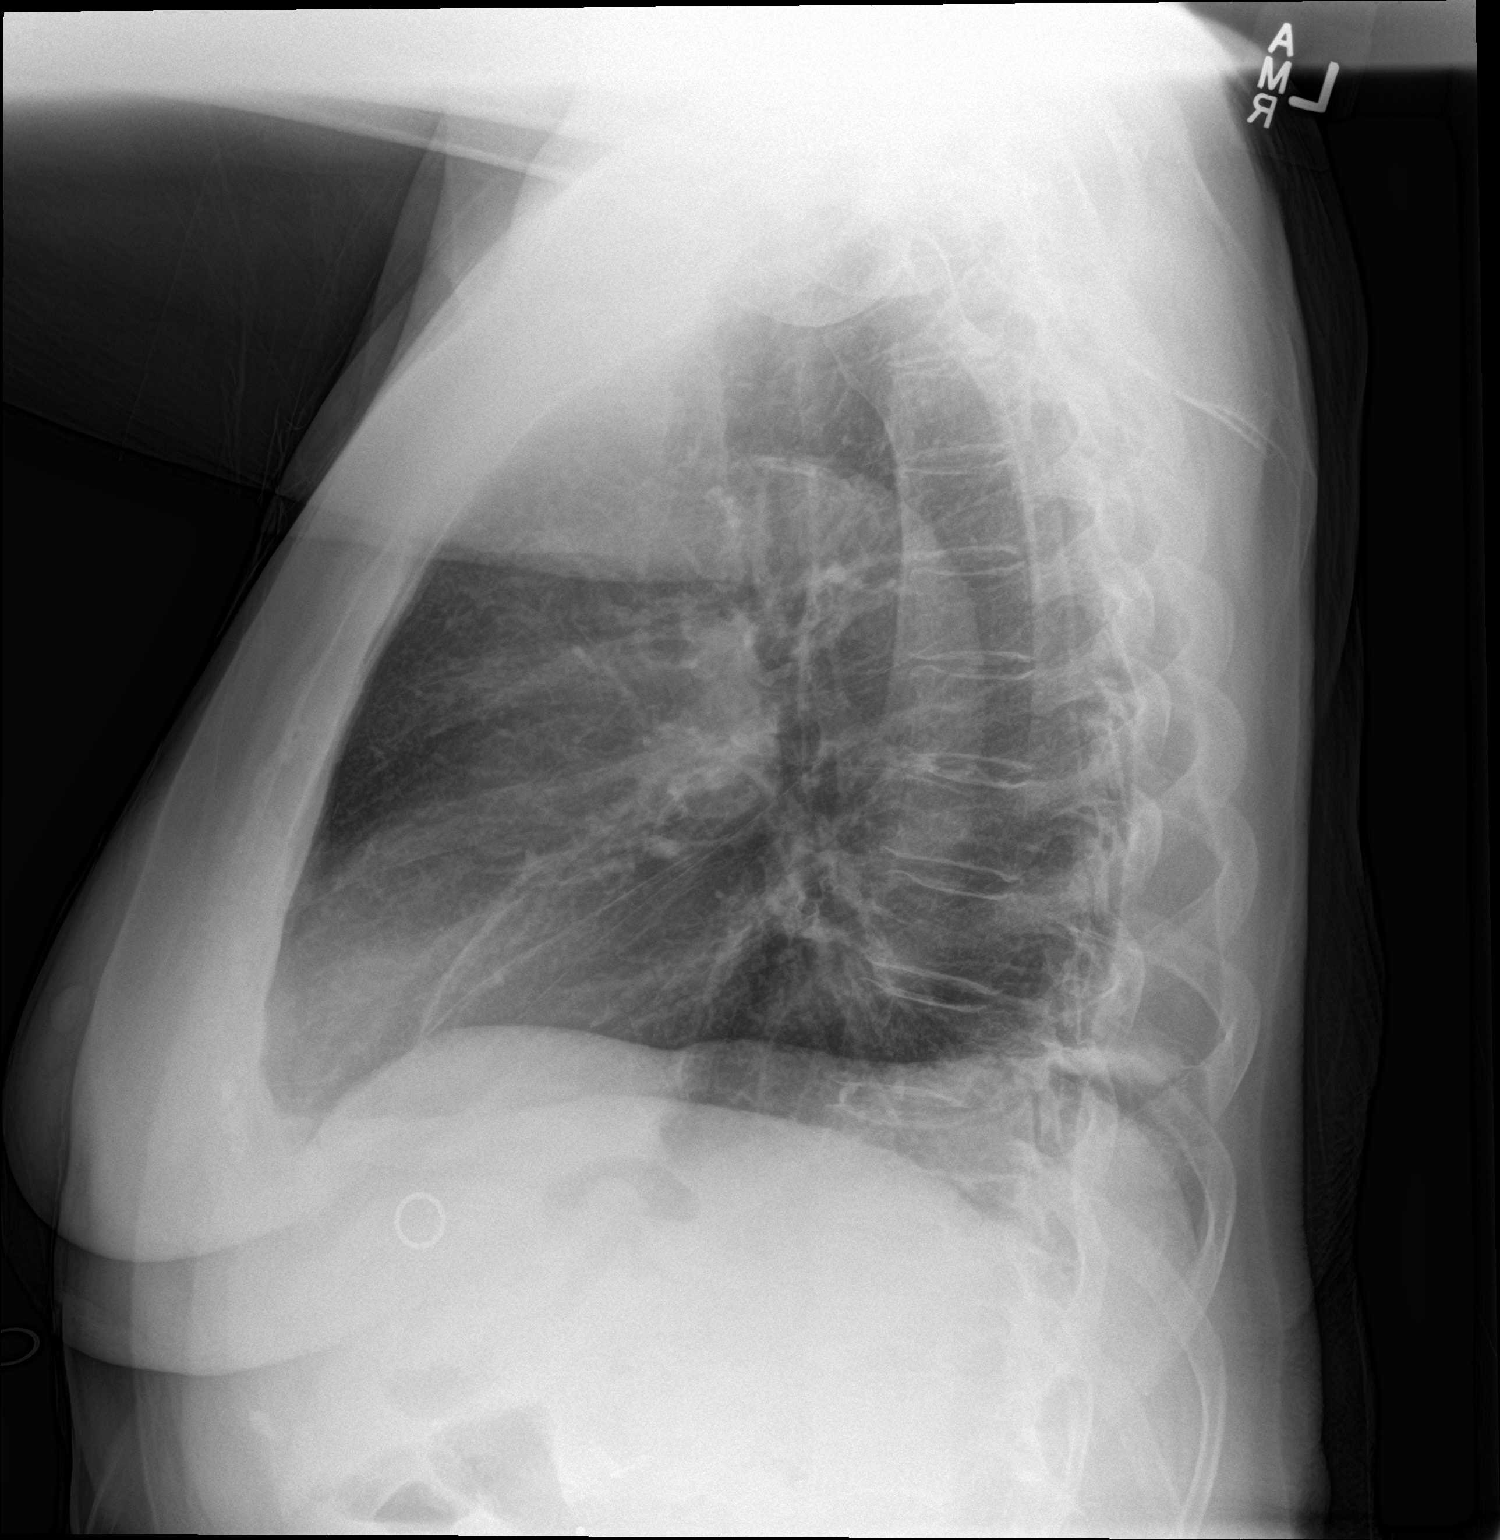

[2 of 2 positions shown; findings below may reference images not displayed]

FINDINGS: Mediastinum and hilar structures are normal. Heart size normal. Left
base pleural-parenchymal scarring again noted. Degenerative changes
scoliosis thoracic spine. Postsurgical changes right upper quadrant.
IMPRESSION: Left base pleural-parenchymal scarring again noted. No acute
cardiopulmonary disease. Stable chest from prior exams

## 2018-12-21 ENCOUNTER — Other Ambulatory Visit: Payer: Self-pay | Admitting: Cardiology

## 2018-12-21 NOTE — Telephone Encounter (Signed)
New Message   *STAT* If patient is at the pharmacy, call can be transferred to refill team.   1. Which medications need to be refilled? (please list name of each medication and dose if known)  metoprolol succinate (TOPROL-XL) 25 MG 24 hr tablet 2. Which pharmacy/location (including street and city if local pharmacy) is medication to be sent to? [facility] [place], [place]  3. Do they need a 30 day or 90 day supply? 90 day

## 2018-12-26 ENCOUNTER — Telehealth: Payer: Self-pay | Admitting: Cardiology

## 2018-12-26 MED ORDER — METOPROLOL SUCCINATE ER 25 MG PO TB24: 12.5000 mg | ORAL_TABLET | Freq: Every evening | ORAL | 2 refills | Status: DC

## 2018-12-26 MED ORDER — METOPROLOL SUCCINATE ER 25 MG PO TB24: 12.5000 mg | ORAL_TABLET | Freq: Every evening | ORAL | 1 refills | Status: DC

## 2018-12-26 NOTE — Telephone Encounter (Signed)
New Message    *STAT* If patient is at the pharmacy, call can be transferred to refill team.   1. Which medications need to be refilled? (please list name of each medication and dose if known) metoprolol succinate (TOPROL-XL) 25 MG 24 hr tablet  2. Which pharmacy/location (including street and city if local pharmacy) is medication to be sent to? [facility] [place], [place]  3. Do they need a 30 day or 90 day supply? 90 day

## 2018-12-26 NOTE — Telephone Encounter (Addendum)
Duplicate request. Rx ordinally sent to [facility] then [facility] Left message for patient stating both rx's have been sent per her request and for her to contact the office with any questions or concerns.

## 2018-12-26 NOTE — Telephone Encounter (Signed)
Left message on patients spouse, [name] Hagin's voicemail informing them of rx being sent to pharmacy. Advised to contact office with any questions or concerns.

## 2018-12-26 NOTE — Addendum Note (Signed)
Addended by: Ulice Brilliant T on: 12/26/2018 03:30 PM   Modules accepted: Orders

## 2018-12-27 ENCOUNTER — Telehealth: Payer: Self-pay | Admitting: Cardiology

## 2018-12-27 ENCOUNTER — Other Ambulatory Visit (INDEPENDENT_AMBULATORY_CARE_PROVIDER_SITE_OTHER): Payer: Self-pay | Admitting: Orthopedic Surgery

## 2018-12-27 DIAGNOSIS — I509 Heart failure, unspecified: Secondary | ICD-10-CM | POA: Diagnosis not present

## 2018-12-27 DIAGNOSIS — E782 Mixed hyperlipidemia: Secondary | ICD-10-CM | POA: Diagnosis not present

## 2018-12-27 DIAGNOSIS — K219 Gastro-esophageal reflux disease without esophagitis: Secondary | ICD-10-CM | POA: Diagnosis not present

## 2018-12-27 DIAGNOSIS — M1A0720 Idiopathic chronic gout, left ankle and foot, without tophus (tophi): Secondary | ICD-10-CM

## 2018-12-27 DIAGNOSIS — I10 Essential (primary) hypertension: Secondary | ICD-10-CM | POA: Diagnosis not present

## 2018-12-27 MED ORDER — METOPROLOL SUCCINATE ER 50 MG PO TB24: ORAL_TABLET | ORAL | 3 refills | Status: DC

## 2018-12-27 NOTE — Telephone Encounter (Signed)
That sounds fine.  If only she is taking is the half dose.  That is all she is going to take.  Dr. husband will continue to take care of her.  Glenetta Hew, MD

## 2018-12-27 NOTE — Telephone Encounter (Signed)
Follow Up:   Please call, concerning pt's Metoprolol.He said it was not correct at the Pharmacy.

## 2018-12-27 NOTE — Telephone Encounter (Signed)
Spoke with husband and he has been giving his wife Toprol 50 mg 1/2 tablet daily since last visit. Per husband patient is doing well on current dose and never made change after d/c from hospital in 02/2018 Advised to continue current medication and start to monitor your blood pressure several times a week, call back if any low blood pressure. [facility] and [facility] Toprol 50 mg 1/2 daily and cancelled old Rx. Will forward to [name] for review

## 2019-01-04 ENCOUNTER — Ambulatory Visit: Payer: Medicare Other | Admitting: Adult Health

## 2019-01-04 DIAGNOSIS — N184 Chronic kidney disease, stage 4 (severe): Secondary | ICD-10-CM | POA: Diagnosis not present

## 2019-01-09 DIAGNOSIS — D631 Anemia in chronic kidney disease: Secondary | ICD-10-CM | POA: Diagnosis not present

## 2019-01-09 DIAGNOSIS — N2581 Secondary hyperparathyroidism of renal origin: Secondary | ICD-10-CM | POA: Diagnosis not present

## 2019-01-09 DIAGNOSIS — I129 Hypertensive chronic kidney disease with stage 1 through stage 4 chronic kidney disease, or unspecified chronic kidney disease: Secondary | ICD-10-CM | POA: Diagnosis not present

## 2019-01-09 DIAGNOSIS — N184 Chronic kidney disease, stage 4 (severe): Secondary | ICD-10-CM | POA: Diagnosis not present

## 2019-01-10 ENCOUNTER — Ambulatory Visit (INDEPENDENT_AMBULATORY_CARE_PROVIDER_SITE_OTHER): Payer: Medicare Other | Admitting: Cardiology

## 2019-01-10 ENCOUNTER — Encounter: Payer: Self-pay | Admitting: Cardiology

## 2019-01-10 ENCOUNTER — Other Ambulatory Visit: Payer: Self-pay

## 2019-01-10 VITALS — BP 118/76 | HR 73 | Temp 97.0°F | Ht 66.0 in | Wt 189.0 lb

## 2019-01-10 DIAGNOSIS — I5032 Chronic diastolic (congestive) heart failure: Secondary | ICD-10-CM

## 2019-01-10 DIAGNOSIS — I2510 Atherosclerotic heart disease of native coronary artery without angina pectoris: Secondary | ICD-10-CM

## 2019-01-10 DIAGNOSIS — I10 Essential (primary) hypertension: Secondary | ICD-10-CM

## 2019-01-10 DIAGNOSIS — E785 Hyperlipidemia, unspecified: Secondary | ICD-10-CM

## 2019-01-10 DIAGNOSIS — Z9861 Coronary angioplasty status: Secondary | ICD-10-CM | POA: Diagnosis not present

## 2019-01-10 DIAGNOSIS — R55 Syncope and collapse: Secondary | ICD-10-CM

## 2019-01-10 MED ORDER — METOPROLOL SUCCINATE ER 25 MG PO TB24: 12.5000 mg | ORAL_TABLET | Freq: Every day | ORAL | 3 refills | Status: DC

## 2019-01-10 NOTE — Progress Notes (Signed)
PCP: Redmond School, MD  Nephrology: [name]  Clinic Note: Chief Complaint  Patient presents with   Follow-up    12 months.   Coronary Artery Disease    No angina   Congestive Heart Failure    Diastolic   HPI:   [name] is a 78 y.o. 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 --> Started to have more brittle diabetes complicated with neuropathy leading to extensive foot and ankle issues.   CAD history: History of BMS PCI to the LAD in 2006 --had post dilation PTCA for ISR in 2011 -otherwise stable since then.  Nonischemic Myoview in August 2013 (did have a short burst of 5 beats NSVT) read as LOW RISK. --No ischemia or infarction  She has some chronic diastolic dysfunction/CHF but has had had issues with labile BP with near syncope due to orthostatic symptoms (husband doses her medications & adjusts without orders).   [name] was last seen on 01/02/2018 -no med changes.  She noted one episode where she had low blood pressures with systolic pressures in the 80s.  Otherwise blood pressures were doing okay. -->  Is on standing dose of metolazone and Bumex managed by nephrologist.  Noted to have significant balance issues with dizziness.  Original plan had been to try to wean off of Toprol --> she restarted at one half of 50 mg tablets twice daily because of palpitations, and anxiety.  Also elevated blood pressures.  --> Nephrology has stopped calcium supplementation and is adjusting vitamin D supplementation.  They have also stopped Metformin for worsening renal function.  Recent Hospitalizations: _0  ER visit #29 2019 for hypokalemia hypoxia.  Seen again on 03/04/2018 for precordial chest pain  Right subtalar-talonavicular fusion 02/22/2018  She has had several Feraheme infusions for anemia.  ER visit  July 2020: UTI.  Started antibiotics and converted for sensitivities.  Reviewed  CV studies:   The following studies were reviewed today: (if available, images/films reviewed: From Epic Chart or Care Everywhere)  2D Echo Aug 02, 2017: Normal EF.  EF 65 to 70%.  Mild LVH.  "GR 1 DD.  (TEE ordered to rule out left atrial mass  TEE 08/03/2017: EF 60 to 65%.  No R WMA.  No atrial thrombus noted.  Lipomatous IAS.  2D Echo February 25, 2018:EF 65-70%.  GR 1 DD.  No or WMA.  Moderate aortic calcification/sclerosis but no stenosis.   Interval History:   [name] as usual comes in today with her husband.  She tries to tell a story, but he oftentimes conflicts.  Basically the only real potential cardiac or chest symptom that she has is about a month ago she had left upper chest discomfort that was sharp squeeze up in the left upper chest radiating to her shoulder that was sort of associated with shortness of breath this discomfort worsened with deep breathing.  Other than that really has not any chest pain or pressure with rest or exertion.  She does not really do much exertion because of her significant ankle/foot conditions that have limited her walking with a walker.  She does say that her balance is a little bit better but she is still bothered by peripheral neuropathy.  She notes having pretty low levels of energy and feels like she may not be  eating as healthy as he should. Cardiovascular review of symptoms: Cardiovascular ROS: positive for - chest pain, dyspnea on exertion, edema, irregular heartbeat, palpitations and Really does not do much to exert herself therefore not much exertional dyspnea.  Swelling is pretty well controlled.  Notes mostly low energy.  One episode of chest pain negative for - orthopnea, paroxysmal nocturnal dyspnea, rapid heart rate or Syncope/near syncope, TIA/amaurosis fugax.  Claudication  The patient does have symptoms concerning for COVID-19 infection (fever, chills, cough,  or new shortness of breath).  The patient is practicing social distancing. ++ Masking.  Not - Groceries/shopping. Not Working   REVIEWED OF SYSTEMS   ROS: A comprehensive was performed. Review of Systems  Constitutional: Positive for malaise/fatigue (Chest baseline fatigue.  Had gotten somewhat better but now with all of her other issues, she does get sleep, and is just generally tired).  HENT: Negative for congestion and nosebleeds.   Respiratory: Negative for cough, shortness of breath and wheezing.   Cardiovascular: Positive for leg swelling (Stable puffy ankles, related to venous stasis and neuropathy.).  Gastrointestinal: Negative for blood in stool, heartburn and melena.  Genitourinary: Negative for hematuria.  Musculoskeletal: Positive for back pain and joint pain (Ankles and feet). Negative for falls (No recent falls, but has had several falls over the last year).  Neurological: Positive for dizziness (Very unsteady gait.  Not just dizzy but poor balance and feet do not support her with peripheral neuropathy.), tingling (Bilateral ankle and feet as well as hand peripheral neuropathy) and weakness (Bilateral legs).  Psychiatric/Behavioral: Positive for depression and memory loss. The patient is nervous/anxious.        She actually seems to be relatively stable from a psychological standpoint today.  In pretty good spirits.   Essentially relatively stable review of symptoms  I have reviewed and (if needed) personally updated the patient's problem list, medications, allergies, past medical and surgical history, social and family history.   PAST MEDICAL HISTORY   Past Medical History:  Diagnosis Date   Anemia    have received iron infusions   Anxiety    Arthritis     osteoarthritis   Breast cancer in female [facility])    Bilateral   CAD S/P percutaneous coronary angioplasty 2007; March 2011   [employer] 3.0 mm 20 mm postdilated 3.6 mm in early mid LAD; status post ISR Cutting  Balloon PTCA and March 11 along with PCI of distal mid lesion with a 3.0 mm 12 mm MultiLink vision BMS; the proximal stent causes jailing of SP1 and SP2 with ostial 70-80% lesions   Cancer ([place])    Melanoma, Squamous cell Carcinoma   Chronic diastolic CHF (congestive heart failure), NYHA class 2 ([place]) 02/28/2018   Chronic kidney disease    stage 2    Dementia (HCC)    mild   Diabetes mellitus type 2 with neurological manifestations ([place])    Dyslipidemia, goal LDL below 70    Full dentures    Genetic testing 12/03/2016   Germline genetic testing was performed through [employer]'s Common Hereditary Cancers Panel + [facility]'s Melanoma Panel. This custom panel includes analysis of the following 51 genes: APC, ATM, AXIN2, BAP1, BARD1, BMPR1A, BRCA1, BRCA2, BRIP1, CDH1, CDK4, CDKN2A, CHEK2, CTNNA1, DICER1, EPCAM, GREM1, HOXB13, KIT, MEN1, MITF, MLH1, MSH2, MSH3, MSH6, MUTYH, NBN, NF1, NTHL1, PALB2, PDGFRA, PMS2, POLD1, POL   GERD (gastroesophageal reflux disease)    Headache(784.0)    History of hematuria    Followed by Dr. Gaynelle Arabian  Hypertension, essential, benign    Peripheral neuropathy    Personal history of radiation therapy    Stroke ([place])    TIA (transient ischemic attack)    multiple in the past   Wears glasses      PAST SURGICAL HISTORY   Past Surgical History:  Procedure Laterality Date   ABDOMINAL HYSTERECTOMY     ANKLE FUSION Right 02/22/2018   Procedure: RIGHT SUBTALAR AND TALONAVICULAR FUSION;  Surgeon: Newt Minion, MD;  Location: [place];  Service: Orthopedics;  Laterality: Right;   APPENDECTOMY     BLADDER SUSPENSION     BREAST LUMPECTOMY Bilateral 2018   BREAST LUMPECTOMY WITH RADIOACTIVE SEED AND SENTINEL LYMPH NODE BIOPSY Bilateral 12/14/2016   Procedure: BILATERAL BREAST LUMPECTOMY WITH BILATERAL  RADIOACTIVE SEED AND BILATERAL AXILLARY  SENTINEL LYMPH NODE BIOPSY ERAS PATHWAY;  Surgeon: Alphonsa Overall, MD;  Location: [place];  Service: General;   Laterality: Bilateral;  [place]  02/24/2006   85% stenosis in the proximal portion of LAD-arrangements made for PCI on 02/25/2006   CARDIAC CATHETERIZATION  02/25/2006   80% LAD lesion stented with a 3x27m Liberte stent resulting in reduction of 80% lesion to 0% residual   CARDIAC CATHETERIZATION  04/26/2006   Medical management   CARDIAC CATHETERIZATION  06/24/2009   60-70% re-stenosis in the proximal LAD. A 3.25x15 cutting balloon, 3 inflations - 14atm-38sec, 13atm-39sec, and 12atm-40sec reduced to less than 10%. 60% stenosis of the mid/distal LAD stented with a 3x131mMultilink stent.   CARDIAC CATHETERIZATION  07/09/2009   Medical management   CAROTID DOPPLER  06/24/2009   40-59% R ICA stenosis. No significant ICA stenosis noted   CHOLECYSTECTOMY     DILATION AND CURETTAGE OF UTERUS     JOINT REPLACEMENT Left    LESION REMOVAL Right 03/11/2015   Procedure:  EXCISION OF RIGHT PRE TIBIAL LESION;  Surgeon: JaJudeth HornMD;  [place] Service: General;  Laterality: Right;   MASS EXCISION Left 06/10/2014   Procedure: EXCISION LEFT LOWER LEG LESION;  Surgeon: JaDoreen SalvageMD;  [place] Service: [place];  Laterality: Left;   MASS EXCISION Left 09/05/2015   Procedure: EXCISION OF LEFT FOREARM  MASS;  Surgeon: JaJudeth HornMD;  [place] [facility] Service: General;  Laterality: Left;   NM MYOVIEW LTD  11/16/2011   5 beats a PVC during recovery.  No skin or infarction.  Reached 5 METs, EKG negative for ischemia    NM MYOVIEW LTD  10/2011   No ischemia or infarction.  Normal EF.  LOW RISK. (Short bursts of 5 beats NSVT during recovery otherwise normal)   SHOULDER ARTHROSCOPY  10/15   SHOULDER ARTHROSCOPY  1995   left   TEE WITHOUT CARDIOVERSION N/A 08/03/2017   Procedure: TRANSESOPHAGEAL ECHOCARDIOGRAM (TEE);  Surgeon: RaSkeet LatchMD;  [place] [facility] Service:  Cardiovascular;; EF 60-65% with no wall motion normalities.  No atrial thrombus noted.  Lightly findings consistent with a lipomatous hypertrophy of the atrial septum.    TENDON REPAIR Left 05/12/2016   Procedure: Left Anterior Tibial Tendon Reconstruction;  Surgeon: MaNewt MinionMD;  [place] [facility] Service: Orthopedics;  Laterality: Left;   TOTAL KNEE ARTHROPLASTY  2005   left   TRANSESOPHAGEAL ECHOCARDIOGRAM  08/03/2016   : EF 60-65% no wall motion normalities.  No atrial thrombus.  Lipomatous hypertrophy of the atrial septum.  No mass noted.  TRANSTHORACIC ECHOCARDIOGRAM  10/2015   EF 55-60 %. Mild LVH. Mild pulmonary hypertension. Normal valves.   TRANSTHORACIC ECHOCARDIOGRAM  07/2017   Vigorous LV function.  EF 65-70%.  Mild diastolic dysfunction with no RWMA. GR 1 DD.  Mild aortic sclerosis/no stenosis.  Questionable right atrial mass discounted with TEE)    WRIST ARTHROPLASTY  2010   cancer lt wrist     MEDICATIONS/ALLERGIES   Current Meds  Medication Sig   bumetanide (BUMEX) 2 MG tablet Take 1 tablet (2 mg total) by mouth 2 (two) times daily.   Calcium Carb-Cholecalciferol (CALCIUM 600+D3) 600-800 MG-UNIT TABS Take 1 tablet by mouth daily.   cephALEXin (KEFLEX) 500 MG capsule Take 1 capsule (500 mg total) by mouth 4 (four) times daily.   clopidogrel (PLAVIX) 75 MG tablet TAKE 1 TABLET DAILY (Patient taking differently: Take 75 mg by mouth daily. )   COD LIVER OIL PO Take 415 mg by mouth daily.   COLCRYS 0.6 MG tablet TAKE 1 TABLET DAILY AS NEEDED FOR GOUT FLARE.   Cranberry 500 MG TABS Take 500 mg by mouth every evening.   diazepam (VALIUM) 5 MG tablet Take 1 tablet (5 mg total) by mouth 2 (two) times daily as needed for anxiety.   exemestane (AROMASIN) 25 MG tablet TAKE 1 TABLET DAILY AFTER BREAKFAST (Patient taking differently: Take 25 mg by mouth daily. )   febuxostat (ULORIC) 40 MG tablet Take 1 tablet (40 mg total) by mouth daily.   gabapentin  (NEURONTIN) 300 MG capsule Take 300-600 mg by mouth daily as needed (nerve pain).   gabapentin (NEURONTIN) 300 MG capsule Take 1 capsule (300 mg total) by mouth 2 (two) times daily. Take 1 during the day if needed and 2 at night   Insulin Lispro (HUMALOG KWIKPEN [place]) Inject into the skin.   insulin NPH Human (HUMULIN N,NOVOLIN N) 100 UNIT/ML injection Inject 20-25 Units into the skin See admin instructions. Inject 20 units in the morning & inject 25 units after supper.   insulin NPH-regular Human (70-30) 100 UNIT/ML injection Inject 20 Units into the skin 2 (two) times daily with a meal.   Krill Oil 500 MG CAPS Take 500 mg by mouth daily.   Methylfol-Methylcob-Acetylcyst (CEREFOLIN NAC) 6-2-600 MG TABS TAKE 1 CAPLET BY MOUTH ONCE DAILY.   metolazone (ZAROXOLYN) 2.5 MG tablet TAKE 1 TABLET DAILY 30 MINUTES BEFORE THE MORNING DOSE OF BUMEX AS DIRECTED   midodrine (PROAMATINE) 5 MG tablet Take 1 tablet (5 mg total) by mouth 3 (three) times daily with meals.   mirtazapine (REMERON) 15 MG tablet Take 15 mg by mouth at bedtime. Taking 1 1/2 tablet at bedtime   nitroGLYCERIN (NITROSTAT) 0.4 MG SL tablet Place 0.4 mg under the tongue every 5 (five) minutes as needed for chest pain.   oxyCODONE-acetaminophen (PERCOCET/ROXICET) 5-325 MG tablet Take 1 tablet by mouth every 6 (six) hours as needed for severe pain.   pantoprazole (PROTONIX) 40 MG tablet TAKE 1 TABLET DAILY (CONTACT OFFICE FOR ADDITIONAL REFILLS, FINAL ATTEMPT) (Patient taking differently: Take 40 mg by mouth daily before breakfast. )   pramipexole (MIRAPEX) 0.25 MG tablet Take 1-2 tablets (0.25-0.5 mg total) by mouth See admin instructions. TAKE 1 TABLET AT 4:00 P.M. AND 2 TABLETS (0.5 MG) AT BEDTIME   rosuvastatin (CRESTOR) 20 MG tablet Take 20 mg by mouth daily with supper.    sennosides-docusate sodium (SENOKOT-S) 8.6-50 MG tablet Take 1 tablet by mouth daily as needed for constipation.   Ubiquinol (QUNOL COQ10/UBIQUINOL/MEGA)  100 MG CAPS Take 100 mg by mouth every evening. CoQ10, Qunol    [DISCONTINUED] metoprolol succinate (TOPROL-XL) 50 MG 24 hr tablet Take 1/2 tablet daily or as directed by [name]    Allergies  Allergen Reactions   Ultram [Tramadol] Other (See Comments)    Pt has seizure activity with this medication   Lipitor [Atorvastatin] Other (See Comments)    Bone and muscle pain   Lyrica [Pregabalin] Other (See Comments)    Weight gain, extremity swelling   Reglan [Metoclopramide] Other (See Comments)    Tardive dyskinesia; paradoxical reaction not relieved by benadryl   Nsaids Swelling    SWELLING REACTION UNSPECIFIED      SOCIAL HISTORY/FAMILY HISTORY   Social History   Tobacco Use   Smoking status: Never Smoker   Smokeless tobacco: Never Used  Substance Use Topics   Alcohol use: No   Drug use: No   Social History   Social History Narrative   She is a married mother of 48, grandmother of 2.  Usually very active and out about the house.  But currently over last 6-8 weeks has been incapacitated.  She never smoked and does not drink alcohol.   10 th grade    family history includes ALS in her brother; Arthritis in her sister; Breast cancer (age of onset: 58) in her sister; Breast cancer (age of onset: 56) in her daughter; COPD in her daughter; Cancer in her paternal aunt, paternal uncle, and paternal uncle; Cervical cancer (age of onset: 74) in her sister; Diabetes in her brother, mother, sister, and sister; Emphysema in her father; Heart attack in her father; Heart attack (age of onset: 80) in her daughter; Heart disease in her father; Hyperlipidemia in her brother; Hypertension in her brother, brother, father, mother, sister, and sister; Kidney disease in her mother; Stomach cancer (age of onset: 17) in her sister; Stroke in her brother.   OBJCTIVE -PE, EKG, labs   Wt Readings from Last 3 Encounters:  01/10/19 189 lb (85.7 kg)  10/04/18 185 lb (83.9 kg)  09/26/18 188 lb  (85.3 kg)    Physical Exam: BP 118/76 (BP Location: Right Arm, Patient Position: Sitting, Cuff Size: Normal)    Pulse 73    Temp (!) 97 F (36.1 C)    Ht 5' 6" (1.676 m)    Wt 189 lb (85.7 kg)    BMI 30.51 kg/m  Physical Exam  Constitutional: She is oriented to person, place, and time. She appears well-developed and well-nourished.  Still somewhat frazzled.  Well-groomed.  HENT:  Head: Normocephalic and atraumatic.  Neck: Normal range of motion. Neck supple. No hepatojugular reflux and no JVD present. Carotid bruit is present (Soft right bruit).  Cardiovascular: Normal rate, regular rhythm, S1 normal, S2 normal and intact distal pulses.  Occasional extrasystoles are present. PMI is not displaced. Exam reveals no S4, no distant heart sounds and no midsystolic click.  Murmur (Soft 1/6 SEM at RUSB) heard. Pulmonary/Chest: Effort normal and breath sounds normal. No respiratory distress. She has no wheezes. She has no rales. She exhibits tenderness (Persistent costochondral tenderness.  Now it is more in the upper sternal border).  Abdominal: Soft. Bowel sounds are normal. She exhibits no distension. There is no abdominal tenderness. There is no rebound and no guarding.  Musculoskeletal: Normal range of motion.        General: Deformity (Both ankles are splayed outward.  She has had fusion surgeries and other issues related to diabetic neuropathy.  Definitely limits mobility) and edema (Trace-1+ BLE) present.  Neurological: She is alert and oriented to person, place, and time.  To person place and time, but is easily confused  Psychiatric: Her behavior is normal.  Frazzled.  Easily confused.  Often interrupted by her husband and contradicted..  Not sure if she is remembering true symptoms.  Vitals reviewed.   Adult ECG Report  Rate: 73 ;  Rhythm: normal sinus rhythm and NSR.  Cannot rule out MI, age undetermined.;   Narrative Interpretation: Stable EKG  ecent Labs: [name] from nephrology  has ordered follow-up chemistry panels.  Lipids from 05/19/2018: TC 146, TG 187, HDL 39, LDL 70.  K+ 4.2.  MG 2.2. Cr 1.64.  K+ 4.2.   ASSESSMENT/PLAN    Problem List Items Addressed This Visit    CAD S/P percutaneous coronary angioplasty (Chronic)    Distal history of BMS PCI followed by PTCA of the LAD. No active angina symptoms. Is on statin beta-blocker.  Also on statin, and Plavix without aspirin.  We are weaning down beta-blocker as well as doing a statin holiday.  Would continue Plavix --meant for secondary protection.  However it is okay to stop for procedures.      Relevant Medications   metoprolol succinate (TOPROL XL) 25 MG 24 hr tablet   Other Relevant Orders   EKG 12-Lead (Completed)   Essential hypertension (Chronic)    Somewhat labile blood pressures.  With her having history of orthostatic dizziness, we are backing off on her meds.  She now remains on low-dose metoprolol but also has midodrine which she takes not always 3 times a day.      Relevant Medications   metoprolol succinate (TOPROL XL) 25 MG 24 hr tablet   Dyslipidemia, goal LDL below 70 (Chronic)    Lipids look pretty much at goal.  A little worried about her energy level and diffuse myalgia/aching and memory issues.  In addition to backing off on a beta-blocker, I would like to see how she does for some time off of rosuvastatin..  If after 3 months, her symptoms have improved.  We will then restart 1/2 tablet daily for a month.  If symptoms are stable then continue at this dose, but otherwise if symptoms recur, would stop altogether.  Conscious decision here would be to go for quality of life symptoms as opposed to quantity.      Relevant Medications   metoprolol succinate (TOPROL XL) 25 MG 24 hr tablet   Syncope (Chronic)    No further episodes.  She is very susceptible to changes in blood pressure and it was probably orthostatic related.  With her still having some orthostatic dizzy symptoms, now  back off a little bit more beta-blocker need to go to 25 mg once daily Toprol as opposed to 25 mg twice daily metoprolol tartrate (hence dropping the dose by one half)      Relevant Medications   metoprolol succinate (TOPROL XL) 25 MG 24 hr tablet   Chronic diastolic CHF (congestive heart failure), NYHA class 2 (HCC) - Primary (Chronic)    I really only she has true CHF symptoms.  Certainly CHF can complicate issues such as UTI URI symptoms, but I do not think this is a major diagnosis for her.  She still takes diuretic which is for extremity edema that could easily be in part venous stasis.  Continue with reduced dose of metoprolol as metoprolol succinate.  She said she uses metolazone 2 days a  week along with her Bumex and swelling has been pretty well controlled.  No real orthopnea or PND.      Relevant Medications   metoprolol succinate (TOPROL XL) 25 MG 24 hr tablet     In general, my plan for Tequita is to try to gradually pull back some medications.  Most the time whenever adjustments are made, the husband will then readjust somewhere along the line.  I am when I try to back off on her metoprolol dose and switch her to once daily Toprol.  She is not really having any angina symptoms and minimal heart failure symptoms.  Her edema is well controlled on current dose of Bumex and twice weekly Zaroxolyn.  We will also see how she does off of statin to see if this helps her memory issues and energy.   COVID-19 Education: The signs and symptoms of COVID-19 were discussed with the patient and how to seek care for testing (follow up with PCP or arrange E-visit).   The importance of social distancing was discussed today.  I spent a total of 5mnutes with the patient and chart review. >  50% of the time was spent in direct patient consultation.  Additional time spent with chart review (studies, outside notes, etc): 63Total Time: 28 min   Current medicines are reviewed at length with the  patient today.  (+/- concerns) notes BP up a bit    Patient Instructions / Medication Changes & Studies & Tests Ordered   Patient Instructions  Medication Instructions:    - CHANGES METOPROLOL SUCCINATE  12. 5 MG ( 1/2 TABLET OF 25 MG AT BEDTIME   STOP TAKING TO ROSUVASTATIN FOR 3 MONTHS  THEN RESTART WITH 1/2 TABLET  FOR ONE MONTH IF  YOU FELL BETTER  THEN CONTINUE IE NOT STOP ALL TOGEHTER.  If you need a refill on your cardiac medications before your next appointment, please call your pharmacy.   Lab work: NOT NEEDED    Testing/Procedures:  Not needed  Follow-Up: [facility] and your health needs are our priority.  As part of our continuing mission to provide you with exceptional heart care, we have created designated Provider Care Teams.  These Care Teams include your primary Cardiologist (physician) and Advanced Practice Providers (APPs -  Physician Assistants and Nurse Practitioners) who all work together to provide you with the care you need, when you need it.  You will need a follow up appointment in 12 months- OCT 2021.  Please call our office 2 months in advance to schedule this appointment.  You may see DGlenetta Hew MD or one of the following Advanced Practice Providers on your designated Care Team:    RRosaria Ferries PA-C  KJory Sims DNP, ANP  Any Other Special Instructions Will Be Listed Below (If Applicable).    Studies Ordered:   Orders Placed This Encounter  Procedures   EKG 12-Lead     DGlenetta Hew M.D., M.S. Interventional Cardiologist   Pager # [phone] # [phone] [place] [address]  [facility]!

## 2019-01-10 NOTE — Patient Instructions (Addendum)
Medication Instructions:    - CHANGES METOPROLOL SUCCINATE  12. 5 MG ( 1/2 TABLET OF 25 MG AT BEDTIME   STOP TAKING TO ROSUVASTATIN FOR 3 MONTHS  THEN RESTART WITH 1/2 TABLET  FOR ONE MONTH IF  YOU FELL BETTER  THEN CONTINUE IE NOT STOP ALL TOGEHTER.  If you need a refill on your cardiac medications before your next appointment, please call your pharmacy.   Lab work: NOT NEEDED    Testing/Procedures:  Not needed  Follow-Up: At [facility], you and your health needs are our priority.  As part of our continuing mission to provide you with exceptional heart care, we have created designated Provider Care Teams.  These Care Teams include your primary Cardiologist (physician) and Advanced Practice Providers (APPs -  Physician Assistants and Nurse Practitioners) who all work together to provide you with the care you need, when you need it. . You will need a follow up appointment in 12 months- OCT 2021.  Please call our office 2 months in advance to schedule this appointment.  You may see Glenetta Hew, MD or one of the following Advanced Practice Providers on your designated Care Team:   . Rosaria Ferries, PA-C . Jory Sims, DNP, ANP  Any Other Special Instructions Will Be Listed Below (If Applicable).

## 2019-01-12 ENCOUNTER — Encounter: Payer: Self-pay | Admitting: Cardiology

## 2019-01-12 NOTE — Assessment & Plan Note (Signed)
Somewhat labile blood pressures.  With her having history of orthostatic dizziness, we are backing off on her meds.  She now remains on low-dose metoprolol but also has midodrine which she takes not always 3 times a day.

## 2019-01-12 NOTE — Assessment & Plan Note (Signed)
Lipids look pretty much at goal.  A little worried about her energy level and diffuse myalgia/aching and memory issues.  In addition to backing off on a beta-blocker, I would like to see how she does for some time off of rosuvastatin..  If after 3 months, her symptoms have improved.  We will then restart 1/2 tablet daily for a month.  If symptoms are stable then continue at this dose, but otherwise if symptoms recur, would stop altogether.  Conscious decision here would be to go for quality of life symptoms as opposed to quantity.

## 2019-01-12 NOTE — Assessment & Plan Note (Signed)
I really only she has true CHF symptoms.  Certainly CHF can complicate issues such as UTI URI symptoms, but I do not think this is a major diagnosis for her.  She still takes diuretic which is for extremity edema that could easily be in part venous stasis.  Continue with reduced dose of metoprolol as metoprolol succinate.  She said she uses metolazone 2 days a week along with her Bumex and swelling has been pretty well controlled.  No real orthopnea or PND.

## 2019-01-12 NOTE — Assessment & Plan Note (Signed)
No further episodes.  She is very susceptible to changes in blood pressure and it was probably orthostatic related.  With her still having some orthostatic dizzy symptoms, now back off a little bit more beta-blocker need to go to 25 mg once daily Toprol as opposed to 25 mg twice daily metoprolol tartrate (hence dropping the dose by one half)

## 2019-01-12 NOTE — Assessment & Plan Note (Signed)
Distal history of BMS PCI followed by [facility] the [facility]. No active angina symptoms. Is on statin beta-blocker.  Also on statin, and Plavix without aspirin.  We are weaning down beta-blocker as well as doing a statin holiday.  Would continue Plavix --meant for secondary protection.  However it is okay to stop for procedures.

## 2019-01-16 ENCOUNTER — Other Ambulatory Visit: Payer: Self-pay | Admitting: *Deleted

## 2019-01-16 NOTE — Patient Outreach (Signed)
[name] Care Management  01/16/2019  [name] 07-20-1940 [id number]   RN Health Coach attempted follow up outreach call to patient.  Patient was unavailable. HIPPA compliance voicemail message left with return callback number.  Plan: RN will call patient again within 30 days.  [facility] [phone]

## 2019-01-16 NOTE — Patient Outreach (Signed)
[facility] [facility]) Care Management  01/16/2019   [name] 1940-12-28 [id number]  RN Health Coach received return telephone call from patient.  Hipaa compliance verified. Per patient she is not doing well. Per patient she is having difficulty walking. Patient has swelling in lower extremities. Per patient her feet have turned outward making it harder to walk. Patient ambulates with a cane. Patient stated that [name] was going to call for physical therapy. She was not sure if her had. Per patient she is now on insulin. Patient stated she is stage 4 kidney disease. Patient stated she refuses to go on dialysis because her mother was. Patient husband is monitoring patient blood sugars, wt and blood pressure. Per patient he gives the insulin. Husband is preparing the meals. Patient has agreed to follow up outreach calls.   Current Medications:  Current Outpatient Medications  Medication Sig Dispense Refill  . bumetanide (BUMEX) 2 MG tablet Take 1 tablet (2 mg total) by mouth 2 (two) times daily. 60 tablet 0  . Calcium Carb-Cholecalciferol (CALCIUM 600+D3) 600-800 MG-UNIT TABS Take 1 tablet by mouth daily.    . cephALEXin (KEFLEX) 500 MG capsule Take 1 capsule (500 mg total) by mouth 4 (four) times daily. 28 capsule 0  . clopidogrel (PLAVIX) 75 MG tablet TAKE 1 TABLET DAILY (Patient taking differently: Take 75 mg by mouth daily. ) 90 tablet 4  . COD LIVER OIL PO Take 415 mg by mouth daily.    Marland Kitchen COLCRYS 0.6 MG tablet TAKE 1 TABLET DAILY AS NEEDED FOR GOUT FLARE. 90 tablet 3  . Cranberry 500 MG TABS Take 500 mg by mouth every evening.    . diazepam (VALIUM) 5 MG tablet Take 1 tablet (5 mg total) by mouth 2 (two) times daily as needed for anxiety. 10 tablet 0  . exemestane (AROMASIN) 25 MG tablet TAKE 1 TABLET DAILY AFTER BREAKFAST (Patient taking differently: Take 25 mg by mouth daily. ) 90 tablet 4  . febuxostat (ULORIC) 40 MG tablet Take 1 tablet (40 mg total) by mouth daily. 90  tablet 3  . gabapentin (NEURONTIN) 300 MG capsule Take 300-600 mg by mouth daily as needed (nerve pain).    Marland Kitchen gabapentin (NEURONTIN) 300 MG capsule Take 1 capsule (300 mg total) by mouth 2 (two) times daily. Take 1 during the day if needed and 2 at night 270 capsule 1  . Insulin Lispro (HUMALOG KWIKPEN Avis) Inject into the skin.    Marland Kitchen insulin NPH Human (HUMULIN N,NOVOLIN N) 100 UNIT/ML injection Inject 20-25 Units into the skin See admin instructions. Inject 20 units in the morning & inject 25 units after supper.    . insulin NPH-regular Human (70-30) 100 UNIT/ML injection Inject 20 Units into the skin 2 (two) times daily with a meal.    . Krill Oil 500 MG CAPS Take 500 mg by mouth daily.    . Methylfol-Methylcob-Acetylcyst (CEREFOLIN NAC) 6-2-600 MG TABS TAKE 1 CAPLET BY MOUTH ONCE DAILY. 90 each 3  . metolazone (ZAROXOLYN) 2.5 MG tablet TAKE 1 TABLET DAILY 30 MINUTES BEFORE THE MORNING DOSE OF BUMEX AS DIRECTED 90 tablet 1  . metoprolol succinate (TOPROL XL) 25 MG 24 hr tablet Take 0.5 tablets (12.5 mg total) by mouth at bedtime. 45 tablet 3  . midodrine (PROAMATINE) 5 MG tablet Take 1 tablet (5 mg total) by mouth 3 (three) times daily with meals. 90 tablet 0  . mirtazapine (REMERON) 15 MG tablet Take 15 mg by mouth at  bedtime. Taking 1 1/2 tablet at bedtime    . nitroGLYCERIN (NITROSTAT) 0.4 MG SL tablet Place 0.4 mg under the tongue every 5 (five) minutes as needed for chest pain.    Marland Kitchen oxyCODONE-acetaminophen (PERCOCET/ROXICET) 5-325 MG tablet Take 1 tablet by mouth every 6 (six) hours as needed for severe pain. 28 tablet 0  . pantoprazole (PROTONIX) 40 MG tablet TAKE 1 TABLET DAILY (CONTACT OFFICE FOR ADDITIONAL REFILLS, FINAL ATTEMPT) (Patient taking differently: Take 40 mg by mouth daily before breakfast. ) 90 tablet 0  . potassium chloride SA (K-DUR) 20 MEQ tablet Take 4 tablets (80 mEq total) by mouth 2 (two) times daily for 7 days. Then return to previous dosing 56 tablet 0  . pramipexole  (MIRAPEX) 0.25 MG tablet Take 1-2 tablets (0.25-0.5 mg total) by mouth See admin instructions. TAKE 1 TABLET AT 4:00 P.M. AND 2 TABLETS (0.5 MG) AT BEDTIME 270 tablet 1  . rosuvastatin (CRESTOR) 20 MG tablet Take 20 mg by mouth daily with supper.     . sennosides-docusate sodium (SENOKOT-S) 8.6-50 MG tablet Take 1 tablet by mouth daily as needed for constipation.    Marland Kitchen Ubiquinol (QUNOL COQ10/UBIQUINOL/MEGA) 100 MG CAPS Take 100 mg by mouth every evening. CoQ10, Qunol      No current facility-administered medications for this visit.     Functional Status:  In your present state of health, do you have any difficulty performing the following activities: 01/16/2019 10/25/2018  Hearing? - N  Vision? N N  Difficulty concentrating or making decisions? Y Y  Comment short term memory loss Patient does have some short term memory loss  Walking or climbing stairs? [name]  Comment uses a cane at all times Uses a cane at all times  Dressing or bathing? Y Y  Comment Husband assist with washing the patient hair and bathing Patient husban assists with bathing and washing hair  Doing errands, shopping? Y Y  Comment husband  helps Husband assists with all  Preparing Food and eating ? [name]  Comment husband prepares food Husband fixes patient her meals  Using the Toilet? N N  In the past six months, have you accidently leaked urine? N N  Do you have problems with loss of bowel control? N N  Managing your Medications? Y Y  Comment husband organizes medications Husband fixes medications for patient due to patient memory impairment  Managing your Finances? [name]  Comment husband handles all Husband caregiver  Housekeeping or managing your Housekeeping? [name]  Comment husband handles husband manages  Some recent data might be hidden    Fall/Depression Screening: Fall Risk  01/16/2019 10/25/2018 07/24/2018  Falls in the past year? 1 1 0  Number falls in past yr: 1 1 -  Comment - this patient has had 3 falls within  the last 3 months -  Injury with Fall? 1 0 -  Risk for fall due to : History of fall(s);Impaired balance/gait;Impaired mobility History of fall(s);Impaired balance/gait;Impaired mobility Impaired balance/gait;Impaired mobility  Follow up Falls evaluation completed;Education provided;Falls prevention discussed Falls evaluation completed;Education provided;Falls prevention discussed -   PHQ 2/9 Scores 01/16/2019 10/25/2018 07/24/2018 03/31/2018 08/23/2017 03/21/2017  PHQ - 2 Score 0 0 0 0 1 1   THN CM Care Plan Problem One     Most Recent Value  Care Plan Problem One  Knowledge Deficit in Self Management of Congestive Heart Failure  Role Documenting the Problem One  Baker for Problem One  Active  THN Long Term Goal   Patient will follow up with health maintenance within the next 90 days  Interventions for Problem One Long Term Goal  RN reiterates health mainenance. RN reiterated Covid precautions. RN discussed exercise, RN will follow up with further discussion  THN CM Short Term Goal #1   Patient will verbalize starting physical therapy within the next 30 days  THN CM Short Term Goal #1 Start Date  01/16/19  Interventions for Short Term Goal #1  RN discussed exercise and patient stated that physical therapy was being ordereed. RN followed up with Physician and verified that it was referred. RN will follow up with patient for compliance  THN CM Short Term Goal #2   Patient will verbalize receiving Living well with diabetes booklet within the next 30 days.  THN CM Short Term Goal #2 Start Date  01/16/19  Interventions for Short Term Goal #2  RN discussed with patient about diabetes education. RN send patient Living well with diabetes education material. RN will follow up with further discussion      Assessment:  Patient is weighing, checking BP and BS as per ordered Patient is taking medications as per ordered Patient is having increase difficulty ambulating  Plan:  RN  discussed Health Care Maintenance RN followed up with [name] on physical therapy RN sent Living with Diabetes booklet RN sent reminder for follow up for eye care and Flu shot RN will follow up within the month of December  [facility] [phone]

## 2019-01-17 DIAGNOSIS — N184 Chronic kidney disease, stage 4 (severe): Secondary | ICD-10-CM | POA: Diagnosis not present

## 2019-01-17 DIAGNOSIS — C4370 Malignant melanoma of unspecified lower limb, including hip: Secondary | ICD-10-CM | POA: Diagnosis not present

## 2019-01-17 DIAGNOSIS — D631 Anemia in chronic kidney disease: Secondary | ICD-10-CM | POA: Diagnosis not present

## 2019-01-17 DIAGNOSIS — R2689 Other abnormalities of gait and mobility: Secondary | ICD-10-CM | POA: Diagnosis not present

## 2019-01-17 DIAGNOSIS — Z794 Long term (current) use of insulin: Secondary | ICD-10-CM | POA: Diagnosis not present

## 2019-01-17 DIAGNOSIS — Z79899 Other long term (current) drug therapy: Secondary | ICD-10-CM | POA: Diagnosis not present

## 2019-01-17 DIAGNOSIS — Z171 Estrogen receptor negative status [ER-]: Secondary | ICD-10-CM | POA: Diagnosis not present

## 2019-01-17 DIAGNOSIS — C50911 Malignant neoplasm of unspecified site of right female breast: Secondary | ICD-10-CM | POA: Diagnosis not present

## 2019-01-17 DIAGNOSIS — R531 Weakness: Secondary | ICD-10-CM | POA: Diagnosis not present

## 2019-01-17 DIAGNOSIS — N2581 Secondary hyperparathyroidism of renal origin: Secondary | ICD-10-CM | POA: Diagnosis not present

## 2019-01-17 DIAGNOSIS — Z7902 Long term (current) use of antithrombotics/antiplatelets: Secondary | ICD-10-CM | POA: Diagnosis not present

## 2019-01-17 DIAGNOSIS — E1122 Type 2 diabetes mellitus with diabetic chronic kidney disease: Secondary | ICD-10-CM | POA: Diagnosis not present

## 2019-01-17 DIAGNOSIS — R32 Unspecified urinary incontinence: Secondary | ICD-10-CM | POA: Diagnosis not present

## 2019-01-17 DIAGNOSIS — E876 Hypokalemia: Secondary | ICD-10-CM | POA: Diagnosis not present

## 2019-01-17 DIAGNOSIS — I129 Hypertensive chronic kidney disease with stage 1 through stage 4 chronic kidney disease, or unspecified chronic kidney disease: Secondary | ICD-10-CM | POA: Diagnosis not present

## 2019-01-23 DIAGNOSIS — I129 Hypertensive chronic kidney disease with stage 1 through stage 4 chronic kidney disease, or unspecified chronic kidney disease: Secondary | ICD-10-CM | POA: Diagnosis not present

## 2019-01-23 DIAGNOSIS — N184 Chronic kidney disease, stage 4 (severe): Secondary | ICD-10-CM | POA: Diagnosis not present

## 2019-01-23 DIAGNOSIS — R2689 Other abnormalities of gait and mobility: Secondary | ICD-10-CM | POA: Diagnosis not present

## 2019-01-23 DIAGNOSIS — D631 Anemia in chronic kidney disease: Secondary | ICD-10-CM | POA: Diagnosis not present

## 2019-01-23 DIAGNOSIS — E1122 Type 2 diabetes mellitus with diabetic chronic kidney disease: Secondary | ICD-10-CM | POA: Diagnosis not present

## 2019-01-23 DIAGNOSIS — R531 Weakness: Secondary | ICD-10-CM | POA: Diagnosis not present

## 2019-01-25 DIAGNOSIS — E1122 Type 2 diabetes mellitus with diabetic chronic kidney disease: Secondary | ICD-10-CM | POA: Diagnosis not present

## 2019-01-25 DIAGNOSIS — R2689 Other abnormalities of gait and mobility: Secondary | ICD-10-CM | POA: Diagnosis not present

## 2019-01-25 DIAGNOSIS — I129 Hypertensive chronic kidney disease with stage 1 through stage 4 chronic kidney disease, or unspecified chronic kidney disease: Secondary | ICD-10-CM | POA: Diagnosis not present

## 2019-01-25 DIAGNOSIS — R531 Weakness: Secondary | ICD-10-CM | POA: Diagnosis not present

## 2019-01-25 DIAGNOSIS — N184 Chronic kidney disease, stage 4 (severe): Secondary | ICD-10-CM | POA: Diagnosis not present

## 2019-01-25 DIAGNOSIS — D631 Anemia in chronic kidney disease: Secondary | ICD-10-CM | POA: Diagnosis not present

## 2019-01-25 IMAGING — CR DG CHEST 2V
2 series · 2 of 2 positions shown · non-contrast
Comparison: 06/09/2017

CLINICAL DATA: Fatigue and leg swelling for 1 day. Stage III renal
failure.

EXAM:
CHEST - 2 VIEW

[chest pa]
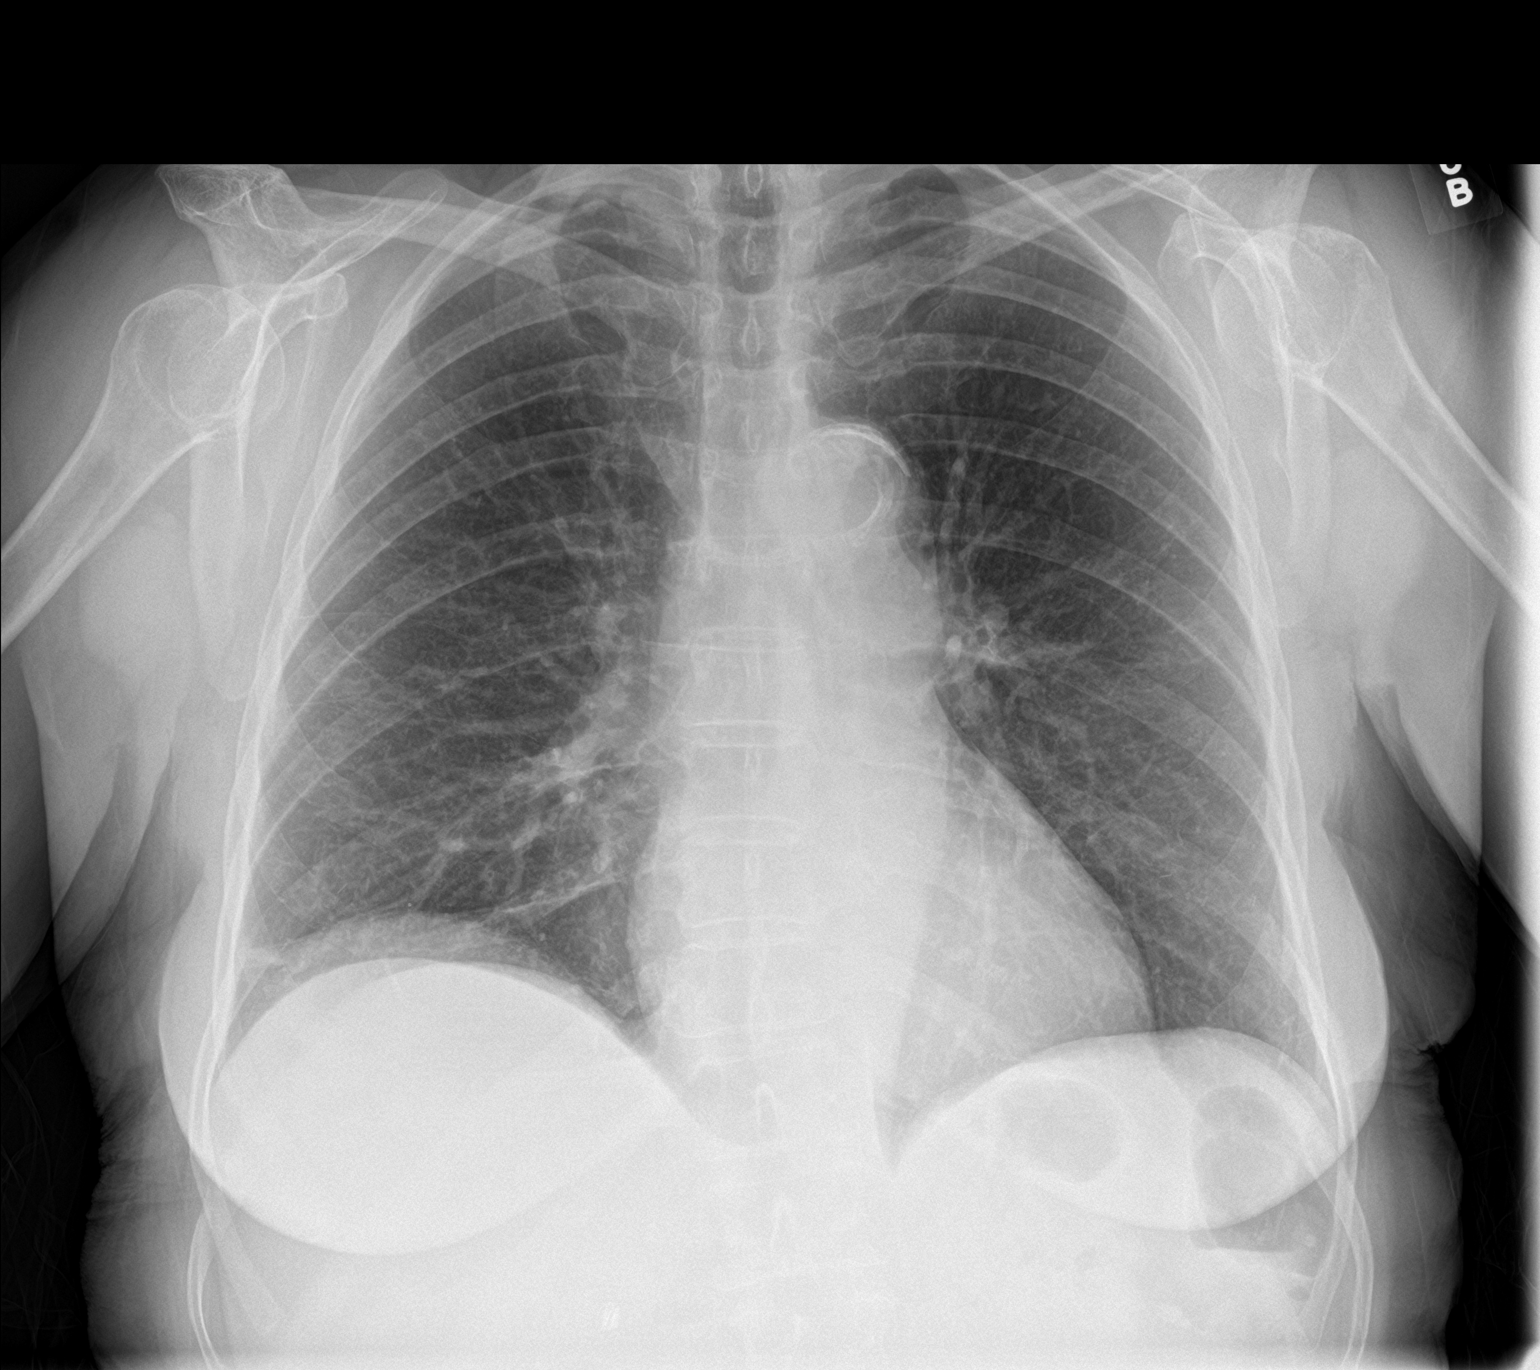

[chest lat]
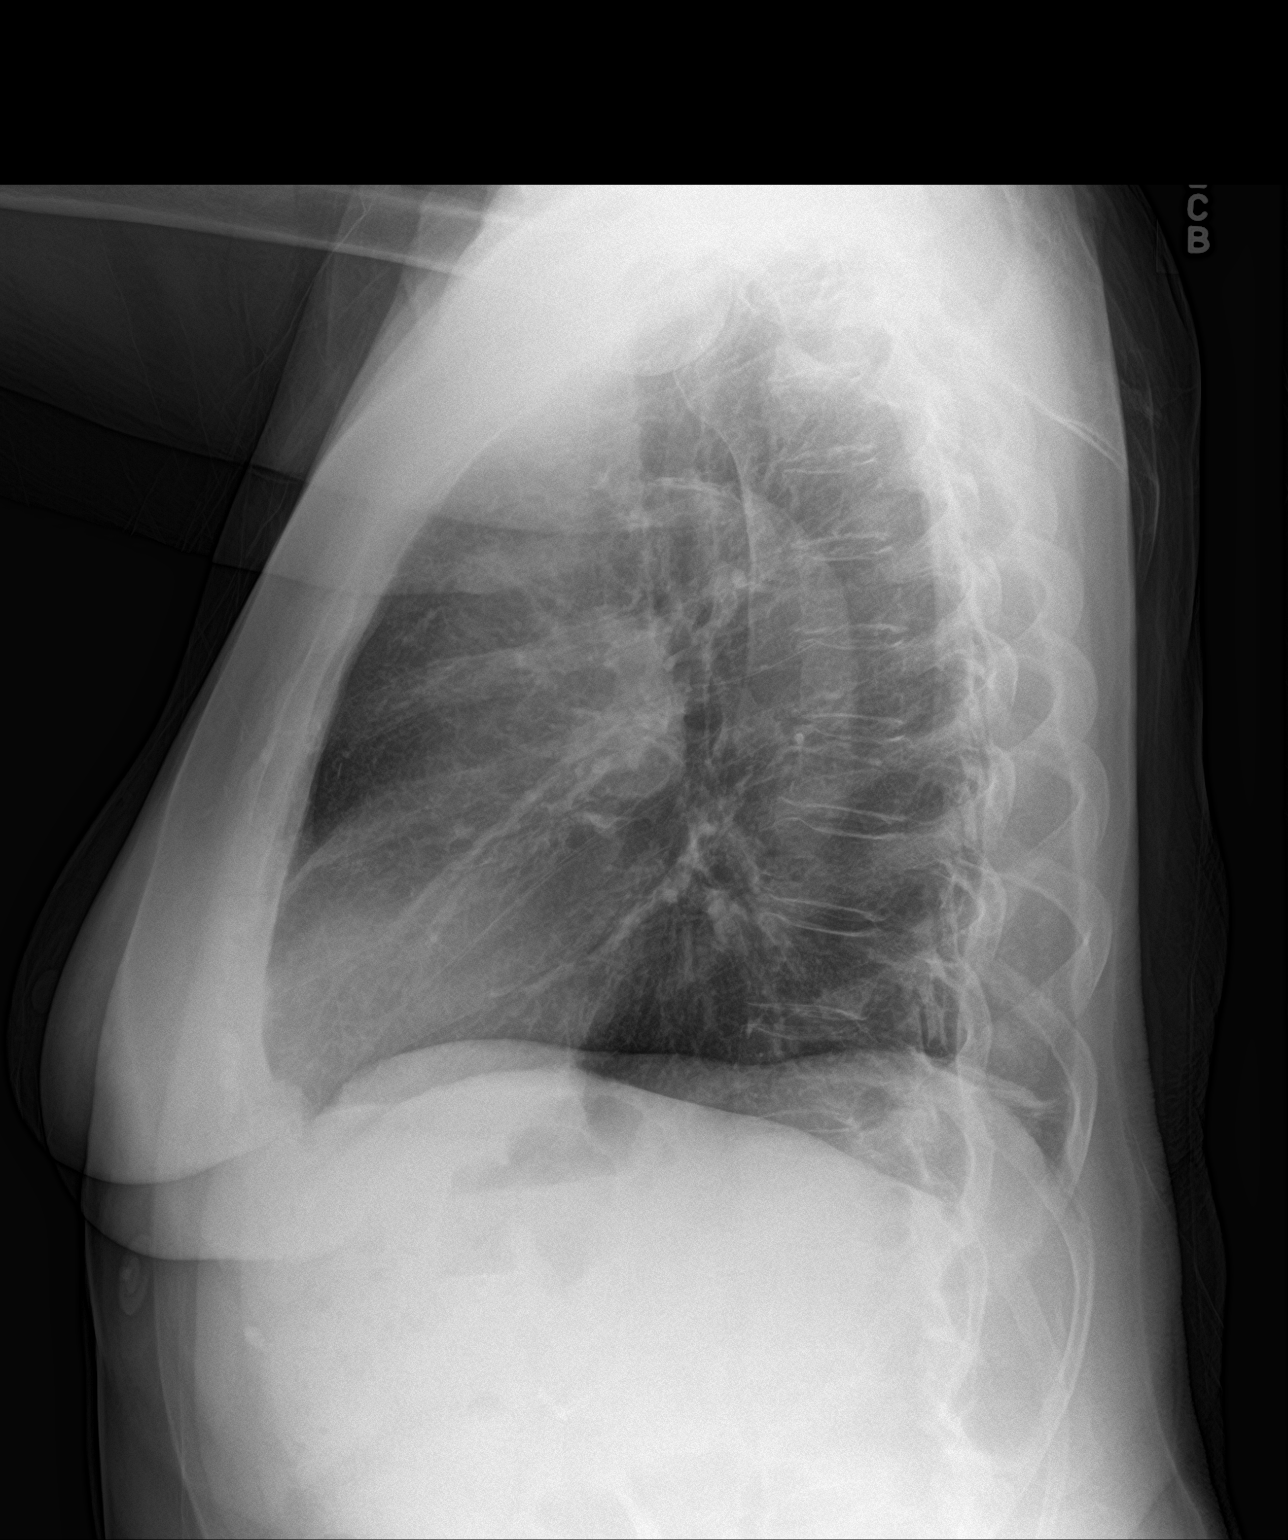

[2 of 2 positions shown; findings below may reference images not displayed]

FINDINGS: Cardiomediastinal silhouette is normal. Mediastinal contours appear
intact. Tortuosity and calcific atherosclerotic disease of the
aorta.

There is no evidence of focal airspace consolidation, pleural
effusion or pneumothorax.

Osseous structures are without acute abnormality. Soft tissues are
grossly normal.
IMPRESSION: No active cardiopulmonary disease.

## 2019-01-26 IMAGING — DX DG LUMBAR SPINE 2-3V
3 series · 3 of 3 positions shown · non-contrast
Comparison: Coronal and sagittal reconstructed images through the
lumbar spine from an abdominal and pelvic CT scan November 16, 2015

CLINICAL DATA: Bilateral flank pain since mastectomies.

EXAM:
LUMBAR SPINE - 2-3 VIEW

[t lumbar spine ap]
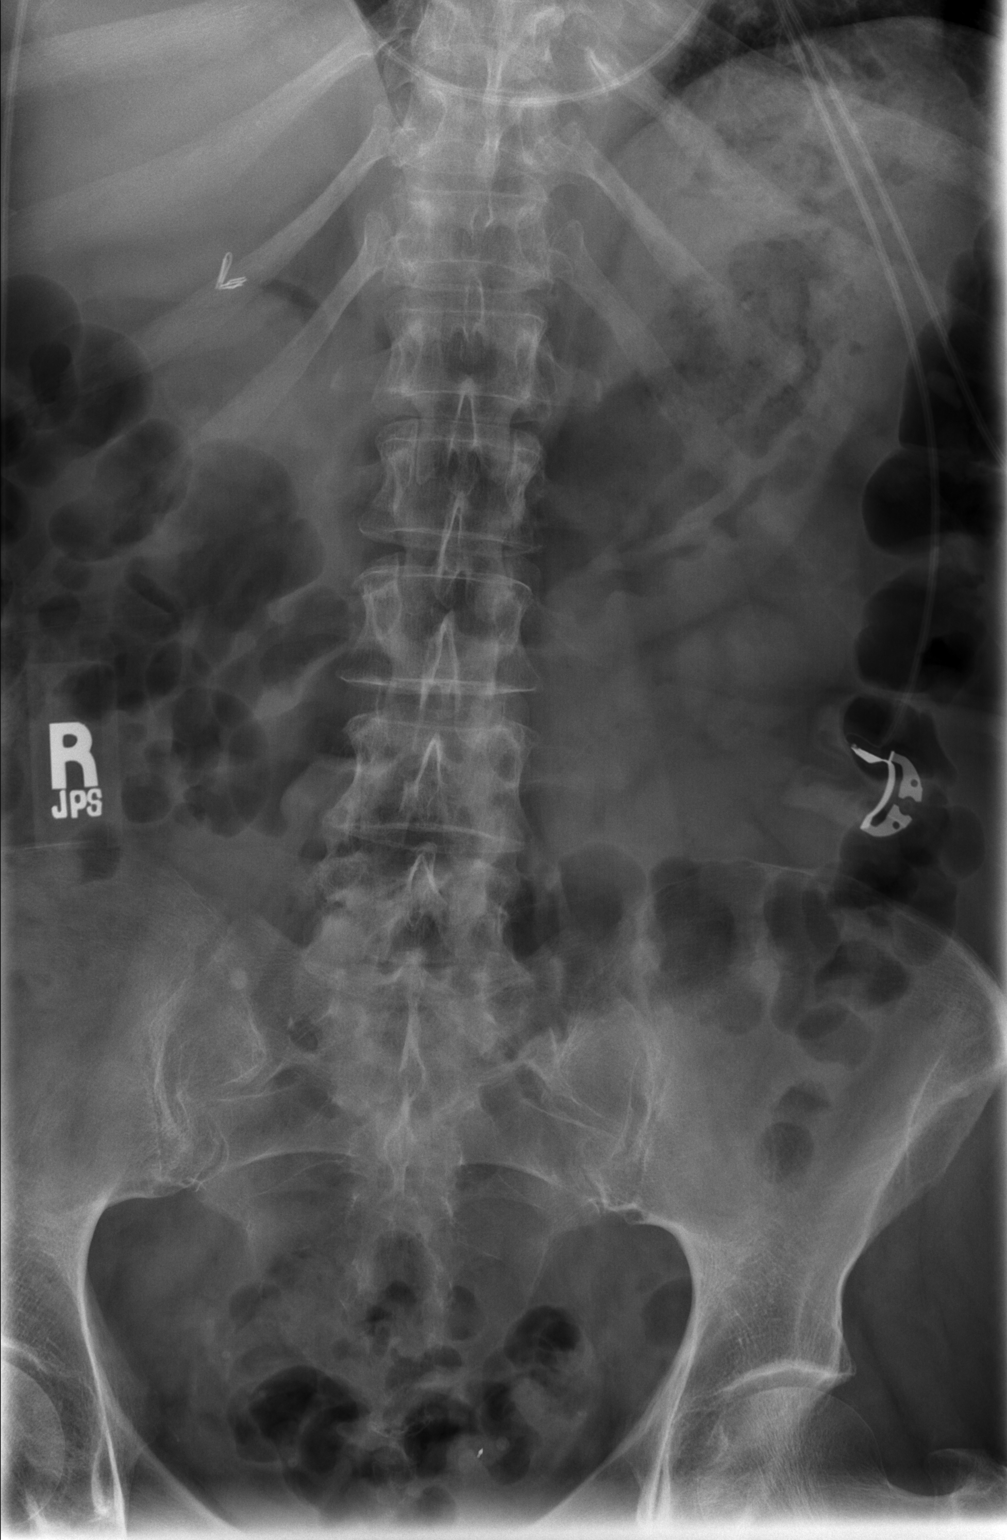

[t lumbar spine lat]
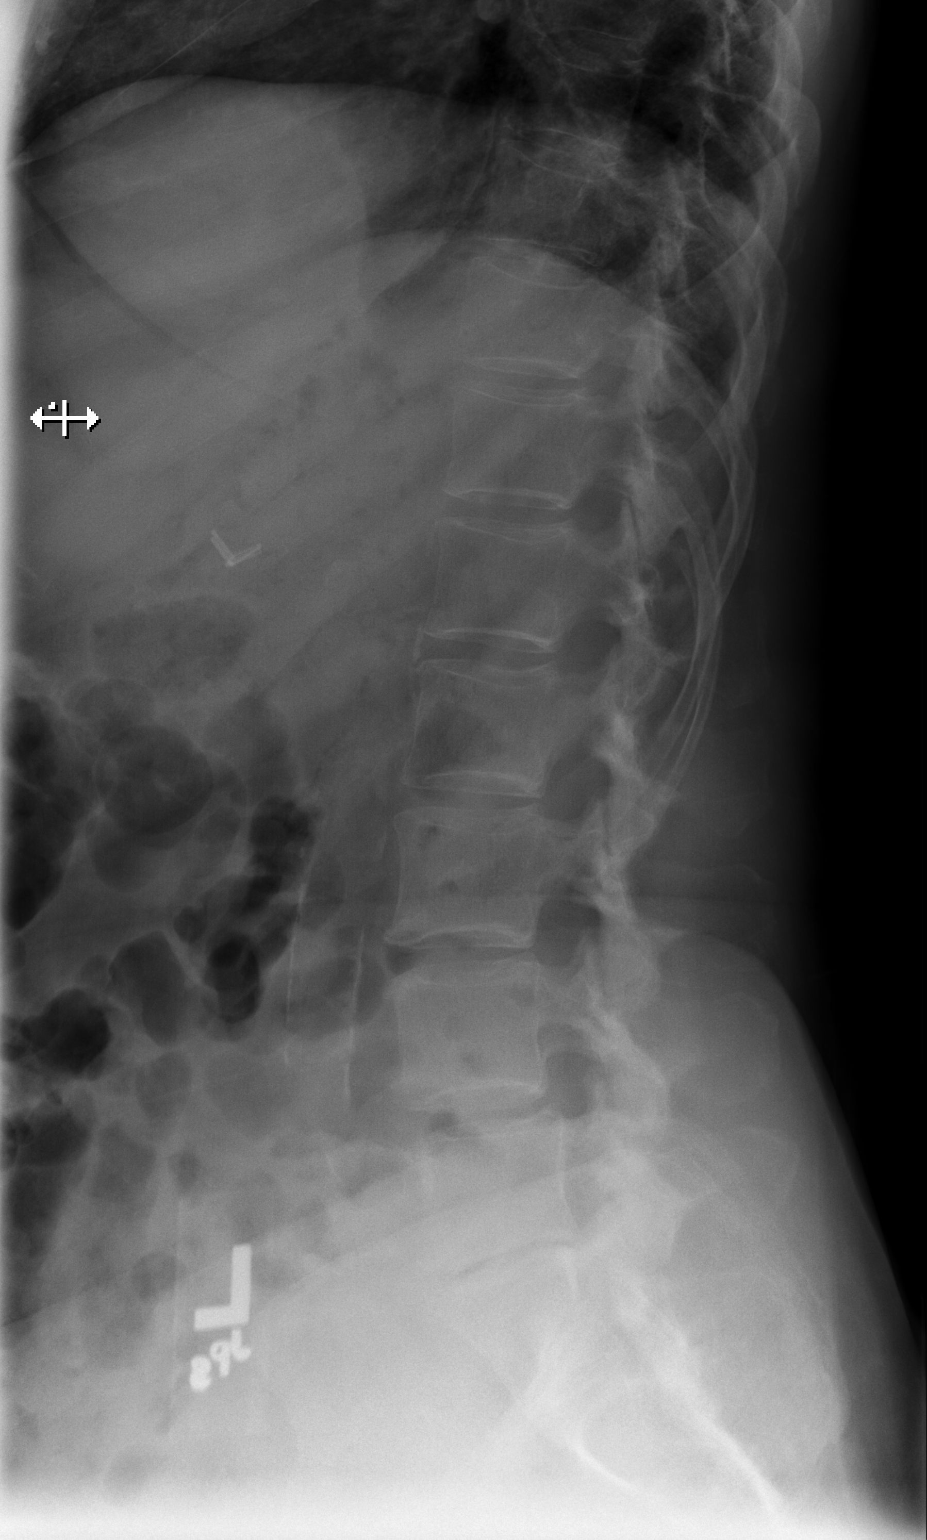

[t lumbar l-5 s-1 spot]
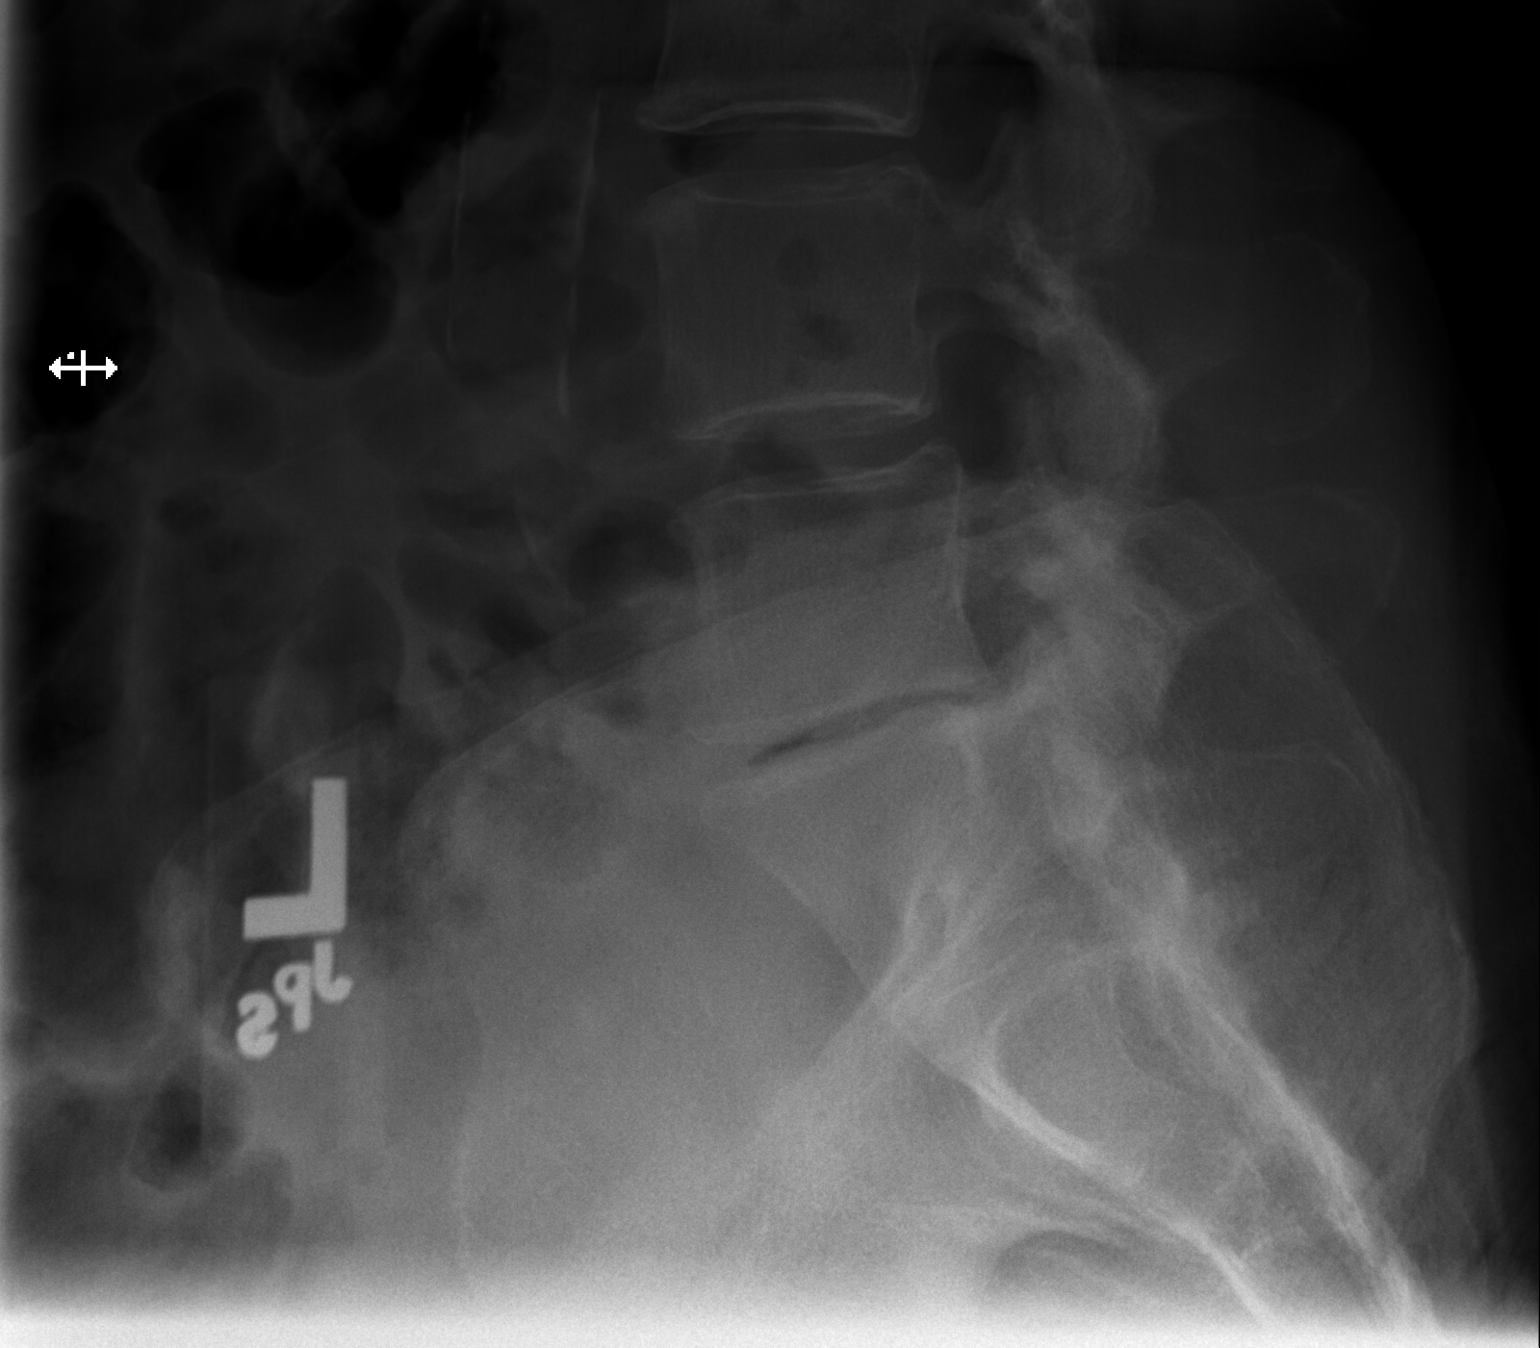

[3 of 3 positions shown; findings below may reference images not displayed]

FINDINGS: The lumbar vertebral bodies are preserved in height. The disc space
heights are reasonably well-maintained with exception of L5-S1 where
there is moderate narrowing with a vacuum phenomenon. The pedicles
and transverse processes are intact. There is minimal
anterolisthesis of L4 with respect L5 amounting to approximately 4
mm. There is calcification in the wall of the aortic arch.
IMPRESSION: Degenerative disc space narrowing at L4-5. No compression fracture.
Minimal anterolisthesis of L4 with respect to L5.

The observed soft tissues of the abdomen are unremarkable.

## 2019-01-27 DIAGNOSIS — I5032 Chronic diastolic (congestive) heart failure: Secondary | ICD-10-CM | POA: Diagnosis not present

## 2019-01-27 DIAGNOSIS — E1122 Type 2 diabetes mellitus with diabetic chronic kidney disease: Secondary | ICD-10-CM | POA: Diagnosis not present

## 2019-01-27 DIAGNOSIS — N184 Chronic kidney disease, stage 4 (severe): Secondary | ICD-10-CM | POA: Diagnosis not present

## 2019-01-27 DIAGNOSIS — I130 Hypertensive heart and chronic kidney disease with heart failure and stage 1 through stage 4 chronic kidney disease, or unspecified chronic kidney disease: Secondary | ICD-10-CM | POA: Diagnosis not present

## 2019-01-29 DIAGNOSIS — I129 Hypertensive chronic kidney disease with stage 1 through stage 4 chronic kidney disease, or unspecified chronic kidney disease: Secondary | ICD-10-CM | POA: Diagnosis not present

## 2019-01-29 DIAGNOSIS — N184 Chronic kidney disease, stage 4 (severe): Secondary | ICD-10-CM | POA: Diagnosis not present

## 2019-01-29 DIAGNOSIS — R2689 Other abnormalities of gait and mobility: Secondary | ICD-10-CM | POA: Diagnosis not present

## 2019-01-29 DIAGNOSIS — E1122 Type 2 diabetes mellitus with diabetic chronic kidney disease: Secondary | ICD-10-CM | POA: Diagnosis not present

## 2019-01-29 DIAGNOSIS — D631 Anemia in chronic kidney disease: Secondary | ICD-10-CM | POA: Diagnosis not present

## 2019-01-29 DIAGNOSIS — R531 Weakness: Secondary | ICD-10-CM | POA: Diagnosis not present

## 2019-01-29 NOTE — Progress Notes (Signed)
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  She also has history of mild cognitive impairment and new complaint today of daytime drowsiness.  She had recent surgery on her ankle.  In November by Dr.Duda. Right subtalar and Talonavicular fusion.  She continues to be nonweightbearing and has a boot on the right.  She has had ER admission for hypokalemia and precordial chest pain since last seen.  She has a history of congestive heart failure.  Blood pressure in the office today 131/80.  She has recently had some changes to her insulin with better control.  Most recent hemoglobin A1c 7.1 on 02/22/2018.  Her mild cognitive impairment is stable.  She has 10 grade education.  She takes Cerefolin.  ADLs 6, IADLs 5 out of 8.  ESS score 12.  She returns for reevaluation  Update 01/31/2019: [name] is a 78 year old female who is being seen today for follow-up regarding postherpetic neuralgia s/p shingles, RLS and cognitive impairment accompanied by her husband.  Pain mostly well managed with ongoing use of gabapentin 300 mg 3 times daily.  She does have intermittent pain right scapula that will flareup at times.  History of mild cognitive impairment which has been stable with ongoing use of Cerefolin.  MMSE 24.  recommended at prior visit to undergo sleep study for potential sleep apnea but initial evaluation postponed with Dr. Rexene Alberts due to COVID-19 restrictions and rescheduled evaluations canceled by patient.  She does not feel as though she needs to pursue sleep study at this time as she has previously had sleep study done "many  years ago" which did not show sleep apnea.  She believes daytime fatigue is more due to polypharmacy and chronic medical conditions.  She continues on Mirapex for RLS with benefit.  She has been having increased difficulty with ambulation and balance with chronic bilateral feet issues with prior procedures.  She continues to follow with orthopedics.  She continues to follow with endocrinology for diabetes with patient reporting recent A1c >7.  Currently making adjustments with insulin regimen and adjusting diet.  No further concerns at this time.      REVIEW OF SYSTEMS: Full 14 system review of systems performed and notable only for those listed, all others are neg:  Constitutional: Mild fatigue Cardiovascular: Swelling Ear/Nose/Throat: neg  Skin: neg Eyes: neg Respiratory: neg Gastroitestinal: neg  Hematology/Lymphatic: neg Endocrine: neg Musculoskeletal: Bilateral feet issues Allergy/Immunology: neg Neurological: Mild cognitive impairment, nerve pain Psychiatric: Depression anxiety Sleep : Restless leg, daytime sleepiness   ALLERGIES: Allergies  Allergen Reactions  . Ultram [Tramadol] Other (See Comments)    Pt has seizure activity with this medication  . Lipitor [Atorvastatin] Other (See Comments)    Bone and muscle pain  . Lyrica [Pregabalin] Other (See Comments)    Weight gain, extremity swelling  . Reglan [Metoclopramide] Other (See Comments)    Tardive dyskinesia; paradoxical reaction not relieved by benadryl  . Nsaids Swelling    SWELLING REACTION UNSPECIFIED     HOME MEDICATIONS: Outpatient Medications Prior to Visit  Medication Sig Dispense Refill  . bumetanide (BUMEX) 2 MG tablet Take 1 tablet (2 mg total) by mouth 2 (two)  times daily. (Patient taking differently: Take 2 mg by mouth 2 (two) times daily. 2 tablets in the morning and 1 at noon) 60 tablet 0  . Calcium Carb-Cholecalciferol (CALCIUM 600+D3) 600-800 MG-UNIT TABS Take 1 tablet by mouth daily.    .  clopidogrel (PLAVIX) 75 MG tablet TAKE 1 TABLET DAILY (Patient taking differently: Take 75 mg by mouth daily. ) 90 tablet 4  . COLCRYS 0.6 MG tablet TAKE 1 TABLET DAILY AS NEEDED FOR GOUT FLARE. 90 tablet 3  . Cranberry 500 MG TABS Take 500 mg by mouth every evening.    . diazepam (VALIUM) 5 MG tablet Take 1 tablet (5 mg total) by mouth 2 (two) times daily as needed for anxiety. 10 tablet 0  . exemestane (AROMASIN) 25 MG tablet TAKE 1 TABLET DAILY AFTER BREAKFAST (Patient taking differently: Take 25 mg by mouth daily. ) 90 tablet 4  . febuxostat (ULORIC) 40 MG tablet Take 1 tablet (40 mg total) by mouth daily. 90 tablet 3  . gabapentin (NEURONTIN) 300 MG capsule Take 300-600 mg by mouth daily as needed (nerve pain).    Marland Kitchen gabapentin (NEURONTIN) 300 MG capsule Take 1 capsule (300 mg total) by mouth 2 (two) times daily. Take 1 during the day if needed and 2 at night 270 capsule 1  . Insulin Lispro (HUMALOG KWIKPEN [place]) Inject into the skin. 100-150= 2 units, 151-200= 3 units and 201-250= 4 units    . insulin NPH Human (HUMULIN N,NOVOLIN N) 100 UNIT/ML injection Inject 20 Units into the skin See admin instructions. Inject 20 units in the morning & inject 25 units after supper.    . insulin NPH-regular Human (70-30) 100 UNIT/ML injection Inject 20 Units into the skin 2 (two) times daily with a meal.    . Krill Oil 500 MG CAPS Take 500 mg by mouth daily.    . Methylfol-Methylcob-Acetylcyst (CEREFOLIN NAC) 6-2-600 MG TABS TAKE 1 CAPLET BY MOUTH ONCE DAILY. 90 each 3  . metolazone (ZAROXOLYN) 2.5 MG tablet TAKE 1 TABLET DAILY 30 MINUTES BEFORE THE MORNING DOSE OF BUMEX AS DIRECTED 90 tablet 1  . metoprolol succinate (TOPROL XL) 25 MG 24 hr tablet Take 0.5 tablets (12.5 mg total) by mouth at bedtime. 45 tablet 3  . mirtazapine (REMERON) 15 MG tablet Take 15 mg by mouth at bedtime. Taking 1 1/2 tablet at bedtime    . nitroGLYCERIN (NITROSTAT) 0.4 MG SL tablet Place 0.4 mg under the tongue every 5 (five) minutes  as needed for chest pain.    Marland Kitchen oxyCODONE-acetaminophen (PERCOCET/ROXICET) 5-325 MG tablet Take 1 tablet by mouth every 6 (six) hours as needed for severe pain. 28 tablet 0  . pantoprazole (PROTONIX) 40 MG tablet TAKE 1 TABLET DAILY (CONTACT OFFICE FOR ADDITIONAL REFILLS, FINAL ATTEMPT) (Patient taking differently: Take 40 mg by mouth daily before breakfast. ) 90 tablet 0  . potassium chloride (KLOR-CON) 20 MEQ packet Take 20 mEq by mouth 2 (two) times daily. 2 tablets in the morning and 1 at bedtime    . rosuvastatin (CRESTOR) 5 MG tablet Take 5 mg by mouth daily.    . sennosides-docusate sodium (SENOKOT-S) 8.6-50 MG tablet Take 1 tablet by mouth daily as needed for constipation.    Marland Kitchen Ubiquinol (QUNOL COQ10/UBIQUINOL/MEGA) 100 MG CAPS Take 100 mg by mouth every evening. CoQ10, Qunol     . pramipexole (MIRAPEX) 0.25 MG tablet Take 1-2 tablets (0.25-0.5 mg total) by mouth See admin instructions. TAKE 1 TABLET AT 4:00 P.M. AND 2  TABLETS (0.5 MG) AT BEDTIME 270 tablet 1  . cephALEXin (KEFLEX) 500 MG capsule Take 1 capsule (500 mg total) by mouth 4 (four) times daily. 28 capsule 0  . COD LIVER OIL PO Take 415 mg by mouth daily.    . midodrine (PROAMATINE) 5 MG tablet Take 1 tablet (5 mg total) by mouth 3 (three) times daily with meals. 90 tablet 0  . potassium chloride SA (K-DUR) 20 MEQ tablet Take 4 tablets (80 mEq total) by mouth 2 (two) times daily for 7 days. Then return to previous dosing 56 tablet 0  . rosuvastatin (CRESTOR) 20 MG tablet Take 20 mg by mouth daily with supper.      No facility-administered medications prior to visit.     PAST MEDICAL HISTORY: Past Medical History:  Diagnosis Date  . Anemia    have received iron infusions  . Anxiety   . Arthritis     osteoarthritis  . Breast cancer in female [facility])    Bilateral  . CAD S/P percutaneous coronary angioplasty 2007; March 2011   [facility] 3.0 mm 20 mm postdilated 3.6 mm in early mid LAD; status post ISR Cutting Balloon PTCA  and March 11 along with PCI of distal mid lesion with a 3.0 mm 12 mm MultiLink vision BMS; the proximal stent causes jailing of SP1 and SP2 with ostial 70-80% lesions  . Cancer (HCC)    Melanoma, Squamous cell Carcinoma  . Chronic diastolic CHF (congestive heart failure), NYHA class 2 ([place]) 02/28/2018  . Chronic kidney disease    stage 2   . Dementia ([place])    mild  . Diabetes mellitus type 2 with neurological manifestations ([place])   . Dyslipidemia, goal LDL below 70   . Full dentures   . Genetic testing 12/03/2016   Germline genetic testing was performed through [facility] Common Hereditary Cancers Panel + [facility] Melanoma Panel. This custom panel includes analysis of the following 51 genes: APC, ATM, AXIN2, BAP1, BARD1, BMPR1A, BRCA1, BRCA2, BRIP1, CDH1, CDK4, CDKN2A, CHEK2, CTNNA1, DICER1, EPCAM, GREM1, HOXB13, KIT, MEN1, MITF, MLH1, MSH2, MSH3, MSH6, MUTYH, NBN, NF1, NTHL1, PALB2, PDGFRA, PMS2, POLD1, POL  . GERD (gastroesophageal reflux disease)   . Headache(784.0)   . History of hematuria    Followed by Dr. Gaynelle Arabian  . Hypertension, essential, benign   . Peripheral neuropathy   . Personal history of radiation therapy   . Stroke ([place])   . TIA (transient ischemic attack)    multiple in the past  . Wears glasses     PAST SURGICAL HISTORY: Past Surgical History:  Procedure Laterality Date  . ABDOMINAL HYSTERECTOMY    . ANKLE FUSION Right 02/22/2018   Procedure: RIGHT SUBTALAR AND TALONAVICULAR FUSION;  Surgeon: Newt Minion, MD;  Location: [place];  Service: Orthopedics;  Laterality: Right;  . APPENDECTOMY    . BLADDER SUSPENSION    . BREAST LUMPECTOMY Bilateral 2018  . BREAST LUMPECTOMY WITH RADIOACTIVE SEED AND SENTINEL LYMPH NODE BIOPSY Bilateral 12/14/2016   Procedure: BILATERAL BREAST LUMPECTOMY WITH BILATERAL  RADIOACTIVE SEED AND BILATERAL AXILLARY  SENTINEL LYMPH NODE BIOPSY ERAS PATHWAY;  Surgeon: Alphonsa Overall, MD;  Location: [place];  Service: General;  Laterality:  Bilateral;  PECTORAL BLOCK  . CARDIAC CATHETERIZATION  02/24/2006   85% stenosis in the proximal portion of LAD-arrangements made for PCI on 02/25/2006  . CARDIAC CATHETERIZATION  02/25/2006   80% LAD lesion stented with a 3x50m [place]te stent resulting in reduction of 80% lesion to 0%  residual  . CARDIAC CATHETERIZATION  04/26/2006   Medical management  . CARDIAC CATHETERIZATION  06/24/2009   60-70% re-stenosis in the proximal LAD. A 3.25x15 cutting balloon, 3 inflations - 14atm-38sec, 13atm-39sec, and 12atm-40sec reduced to less than 10%. 60% stenosis of the mid/distal LAD stented with a 3x52m Multilink stent.  .Marland KitchenCARDIAC CATHETERIZATION  07/09/2009   Medical management  . CAROTID DOPPLER  06/24/2009   40-59% R ICA stenosis. No significant ICA stenosis noted  . CHOLECYSTECTOMY    . DILATION AND CURETTAGE OF UTERUS    . JOINT REPLACEMENT Left   . LESION REMOVAL Right 03/11/2015   Procedure:  EXCISION OF RIGHT PRE TIBIAL LESION;  Surgeon: JJudeth Horn MD;  [place]  Service: General;  Laterality: Right;  . MASS EXCISION Left 06/10/2014   Procedure: EXCISION LEFT LOWER LEG LESION;  Surgeon: JDoreen Salvage MD;  [place]  Service: General;  Laterality: Left;  .Marland KitchenMASS EXCISION Left 09/05/2015   Procedure: EXCISION OF LEFT FOREARM  MASS;  Surgeon: JJudeth Horn MD;  [place]  Service: General;  Laterality: Left;  . NM MYOVIEW LTD  11/16/2011   5 beats a PVC during recovery.  No skin or infarction.  Reached 5 METs, EKG negative for ischemia   . NM MYOVIEW LTD  10/2011   No ischemia or infarction.  Normal EF.  LOW RISK. (Short bursts of 5 beats NSVT during recovery otherwise normal)  . SHOULDER ARTHROSCOPY  10/15  . SHOULDER ARTHROSCOPY  1995   left  . TEE WITHOUT CARDIOVERSION N/A 08/03/2017   Procedure: TRANSESOPHAGEAL ECHOCARDIOGRAM (TEE);  Surgeon: RSkeet Latch MD;  [place]  Service: Cardiovascular;;  EF 60-65% with no wall motion normalities.  No atrial thrombus noted.  Lightly findings consistent with a lipomatous hypertrophy of the atrial septum.   . TENDON REPAIR Left 05/12/2016   Procedure: Left Anterior Tibial Tendon Reconstruction;  Surgeon: MNewt Minion MD;  [place]  Service: Orthopedics;  Laterality: Left;  . TOTAL KNEE ARTHROPLASTY  2005   left  . TRANSESOPHAGEAL ECHOCARDIOGRAM  08/03/2016   : EF 60-65% no wall motion normalities.  No atrial thrombus.  Lipomatous hypertrophy of the atrial septum.  No mass noted.  . TRANSTHORACIC ECHOCARDIOGRAM  10/2015   EF 55-60 %. Mild LVH. Mild pulmonary hypertension. Normal valves.  . TRANSTHORACIC ECHOCARDIOGRAM  07/2017   Vigorous LV function.  EF 65-70%.  Mild diastolic dysfunction with no RWMA. GR 1 DD.  Mild aortic sclerosis/no stenosis.  Questionable right atrial mass discounted with TEE)   . WRIST ARTHROPLASTY  2010   cancer lt wrist    FAMILY HISTORY: Family History  Problem Relation Age of Onset  . Diabetes Mother   . Hypertension Mother   . Kidney disease Mother   . Hypertension Father   . Emphysema Father   . Heart attack Father   . Heart disease Father   . Arthritis Sister   . Diabetes Sister   . Hypertension Sister   . Breast cancer Sister 339      treated with mastectomy  . Cervical cancer Sister 281 . Hypertension Brother   . Hyperlipidemia Brother   . Diabetes Brother   . Stroke Brother   . Diabetes Sister   . Hypertension Sister   . Stomach cancer Sister 624 . Hypertension Brother   . ALS Brother        d.62s  . Breast  cancer Daughter 62       triple negative treated with lumpectomy, chemo, radiation  . Heart attack Daughter 76       d.45  . COPD Daughter   . Cancer Paternal Aunt        unspecified type  . Cancer Paternal Uncle        unspecified type  . Cancer Paternal Uncle        unspecified type    SOCIAL HISTORY: Social History   Socioeconomic History  . Marital status: Married     Spouse name: [name]  . Number of children: 3  . Years of education: 61  . Highest education level: Not on file  Occupational History  . Occupation: retired    Fish farm manager: RETIRED  Social Needs  . Financial resource strain: Not on file  . Food insecurity    Worry: Not on file    Inability: Not on file  . Transportation needs    Medical: Not on file    Non-medical: Not on file  Tobacco Use  . Smoking status: Never Smoker  . Smokeless tobacco: Never Used  Substance and Sexual Activity  . Alcohol use: No  . Drug use: No  . Sexual activity: Never  Lifestyle  . Physical activity    Days per week: Not on file    Minutes per session: Not on file  . Stress: Not on file  Relationships  . Social Herbalist on phone: Not on file    Gets together: Not on file    Attends religious service: Not on file    Active member of club or organization: Not on file    Attends meetings of clubs or organizations: Not on file    Relationship status: Not on file  . Intimate partner violence    Fear of current or ex partner: Not on file    Emotionally abused: Not on file    Physically abused: Not on file    Forced sexual activity: Not on file  Other Topics Concern  . Not on file  Social History Narrative   She is a married mother of 71, grandmother of 2.  Usually very active and out about the house.  But currently over last 6-8 weeks has been incapacitated.  She never smoked and does not drink alcohol.   10 th grade     PHYSICAL EXAM  Vitals:   01/31/19 0745  BP: 131/86  Pulse: 76  Temp: 97.6 F (36.4 C)  TempSrc: Oral  Weight: 190 lb 9.6 oz (86.5 kg)  Height: _0  (1.676 m)   Body mass index is 30.76 kg/m.  Generalized: Pleasant obese elderly Caucasian female in no acute distress  Head: normocephalic and atraumatic Neck: Supple,  Musculoskeletal: No deformity Cardiovascular: 1+ BLE edema  Neurological examination   Mentation:  MMSE - Mini Mental State Exam  01/31/2019 04/06/2018 09/01/2017  Not completed: (No Data) - -  Orientation to time _1 Orientation to Place _2 Registration _3 Attention/ Calculation _4 Recall _5 Language- name 2 objects _6 Language- repeat _7 Language- follow 3 step command _8 Language- read & follow direction _9 Write a sentence _10 Copy design _11 Total score _12 Cranial nerve II-XII: Pupils were equal round reactive to light extraocular  movements were full, visual field were full on confrontational test. Facial sensation and strength were normal. hearing was intact to finger rubbing bilaterally. Uvula tongue midline. head turning and shoulder shrug were normal and symmetric.Tongue protrusion into cheek strength was normal. Motor: normal bulk and tone, full strength in the BUE, BLE  Sensory: Decreased pinprick to mid shin decreased vibratory to toes bilaterally Coordination: finger-nose-finger,  no dysmetria Reflexes: 1+ upper lower and symmetric plantar responses were flexor bilaterally. Gait and Station: Ambulates with broad-based gait and use a cane     DIAGNOSTIC DATA (LABS, IMAGING, TESTING) - I reviewed patient records, labs, notes, testing and imaging myself where available.  Lab Results  Component Value Date   WBC 10.0 10/04/2018   HGB 12.1 10/04/2018   HCT 38.1 10/04/2018   MCV 92.0 10/04/2018   PLT 259 10/04/2018      Component Value Date/Time   NA 136 10/04/2018 2007   NA 140 08/01/2017 1143   K 3.0 (L) 10/04/2018 2007   CL 96 (L) 10/04/2018 2007   CO2 27 10/04/2018 2007   GLUCOSE 159 (H) 10/04/2018 2007   BUN 42 (H) 10/04/2018 2007   BUN 25 08/01/2017 1143   CREATININE 1.89 (H) 10/04/2018 2007   CREATININE 2.45 (H) 02/13/2018 1114   CREATININE 0.94 01/09/2013 0839   CALCIUM 9.5 10/04/2018 2007   PROT 6.5 10/04/2018 2007   PROT 7.1 08/01/2017 1143   ALBUMIN 3.5 10/04/2018 2007   ALBUMIN 4.7 08/01/2017 1143   AST 24 10/04/2018 2007   AST  22 02/13/2018 1114   ALT 17 10/04/2018 2007   ALT 20 02/13/2018 1114   ALKPHOS 95 10/04/2018 2007   BILITOT 0.8 10/04/2018 2007   BILITOT 0.4 02/13/2018 1114   GFRNONAA 25 (L) 10/04/2018 2007   GFRNONAA 18 (L) 02/13/2018 1114   GFRAA 29 (L) 10/04/2018 2007   GFRAA 20 (L) 02/13/2018 1114   Lab Results  Component Value Date   CHOL  12/31/2009    148        ATP III CLASSIFICATION:  <200     mg/dL   Desirable  200-239  mg/dL   Borderline High  >=240    mg/dL   High          HDL 74 12/31/2009   LDLCALC  12/31/2009    47        Total Cholesterol/HDL:CHD Risk Coronary Heart Disease Risk Table                     Men   Women  1/2 Average Risk   3.4   3.3  Average Risk       5.0   4.4  2 X Average Risk   9.6   7.1  3 X Average Risk  23.4   11.0        Use the calculated Patient Ratio above and the CHD Risk Table to determine the patient's CHD Risk.        ATP III CLASSIFICATION (LDL):  <100     mg/dL   Optimal  100-129  mg/dL   Near or Above                    Optimal  130-159  mg/dL   Borderline  160-189  mg/dL   High  >190     mg/dL   Very High   TRIG 134 12/31/2009   CHOLHDL 2.0 12/31/2009   Lab Results  Component Value  Date   HGBA1C 7.1 (H) 02/24/2018   Lab Results  Component Value Date   AREQJEAD07 354 09/26/2009   Lab Results  Component Value Date   TSH 2.908 02/25/2018      ASSESSMENT AND PLAN 78y.o. 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 Continues on Cerefolin.  Likely restless leg syndrome which has been stable on Mirapex, Peripheral neuropathy ([place]), Chronic kidney disease; Headache(784.0); gait abnormality . Postherpetic neuralgia following shingles in February 2018.  Patient failed lyrica and Topamax.  Continues on gabapentin with benefit but does have occasional flare of postherpetic pain within right scapula   PLAN:  Trial transdermal  compound cream for intermittent flareup of postherpetic pain-will fax order to pharmacy Continue Gabapentin 300 mg 3 times daily Continue cerefolin -MMSE stable without change from prior visit Continue Mirapex for RLS -refill prescribed Discussion with patient regarding evaluation for potential sleep apnea.  Mild degree of fatigue during the day with occasional naps which could be very likely associated with polypharmacy and comorbid conditions.  Advised patient to call office if symptoms of fatigue or insomnia worsen and it would be highly recommended at that time to pursue sleep evaluation Continue to follow with endocrinology for diabetic management  Follow-up in 1 year or call earlier if needed  I spent 35 min  in total face to face time with the patient more than 50% of which was spent counseling and coordination of care, discussion regarding continuation of current medication regimen with initiating trial of transdermal compounding cream, discussion regarding other comorbid conditions and difficulty controlling diabetes, and answered all questions to patient and husband satisfaction  Frann Rider, AGNP-BC  [facility] [address] [place], [place] [address]  Phone [phone] Fax [phone] Note: This document was prepared with digital dictation and possible smart phrase technology. Any transcriptional errors that result from this process are unintentional.

## 2019-01-31 ENCOUNTER — Telehealth: Payer: Self-pay | Admitting: Adult Health

## 2019-01-31 ENCOUNTER — Ambulatory Visit (INDEPENDENT_AMBULATORY_CARE_PROVIDER_SITE_OTHER): Payer: Medicare Other | Admitting: Adult Health

## 2019-01-31 ENCOUNTER — Other Ambulatory Visit: Payer: Self-pay

## 2019-01-31 ENCOUNTER — Encounter: Payer: Self-pay | Admitting: Adult Health

## 2019-01-31 VITALS — BP 131/86 | HR 76 | Temp 97.6°F | Ht 66.0 in | Wt 190.6 lb

## 2019-01-31 DIAGNOSIS — G2581 Restless legs syndrome: Secondary | ICD-10-CM | POA: Diagnosis not present

## 2019-01-31 DIAGNOSIS — B0229 Other postherpetic nervous system involvement: Secondary | ICD-10-CM | POA: Diagnosis not present

## 2019-01-31 DIAGNOSIS — N184 Chronic kidney disease, stage 4 (severe): Secondary | ICD-10-CM | POA: Diagnosis not present

## 2019-01-31 DIAGNOSIS — R531 Weakness: Secondary | ICD-10-CM | POA: Diagnosis not present

## 2019-01-31 DIAGNOSIS — G3184 Mild cognitive impairment, so stated: Secondary | ICD-10-CM

## 2019-01-31 DIAGNOSIS — R2689 Other abnormalities of gait and mobility: Secondary | ICD-10-CM | POA: Diagnosis not present

## 2019-01-31 DIAGNOSIS — E1122 Type 2 diabetes mellitus with diabetic chronic kidney disease: Secondary | ICD-10-CM | POA: Diagnosis not present

## 2019-01-31 DIAGNOSIS — I129 Hypertensive chronic kidney disease with stage 1 through stage 4 chronic kidney disease, or unspecified chronic kidney disease: Secondary | ICD-10-CM | POA: Diagnosis not present

## 2019-01-31 DIAGNOSIS — D631 Anemia in chronic kidney disease: Secondary | ICD-10-CM | POA: Diagnosis not present

## 2019-01-31 MED ORDER — PRAMIPEXOLE DIHYDROCHLORIDE 0.25 MG PO TABS: 0.2500 mg | ORAL_TABLET | ORAL | 1 refills | Status: DC

## 2019-01-31 NOTE — Patient Instructions (Addendum)
Your Plan:  Continue current gabapentin dosage, mirapex and Cerefolin   Try use of compounding nerve cream   Call office with any worsening of symptoms    Follow up in 1 year or call earlier if needed     Thank you for coming to see [place] at [facility]. I hope we have been able to provide you high quality care today.  You may receive a patient satisfaction survey over the next few weeks. We would appreciate your feedback and comments so that we may continue to improve ourselves and the health of our patients.

## 2019-01-31 NOTE — Telephone Encounter (Signed)
I called and spoke to [name], pharmacist with [facility].  She stated they wanted to substitute meloxicam for pts prescription.  Her allergy NSAIDs stated swelling.  I called pt and she stated that she has leg swelling legs feet due to kidney failure.  Is willing to try and if a problem will stop.  Is this ok?

## 2019-01-31 NOTE — Telephone Encounter (Signed)
[employer] called and stated there is an allergy in the Trial transdermal compound cream that was sent into the pharmacy  CB# [phone]

## 2019-02-01 NOTE — Telephone Encounter (Signed)
I relayed to pt and she verbalized understanding and if a problem will let us know. I called and spoke to [place], and ok to proceed with substitution.  She appreciated call.

## 2019-02-01 NOTE — Telephone Encounter (Signed)
Topical cream ordered for intermittent/occasional use for flareup of postherpetic nerve pain due to prior herpes zoster.  Due to limited use, it should be safe for her to use it in regards to recent stroke and prior intolerance.  If she starts to have any type of side effects or start using on a more regular basis, please advise her to notify us so compounding cream can be changed.  Thank you.

## 2019-02-05 NOTE — Progress Notes (Signed)
I agree with the above plan 

## 2019-02-06 DIAGNOSIS — E1122 Type 2 diabetes mellitus with diabetic chronic kidney disease: Secondary | ICD-10-CM | POA: Diagnosis not present

## 2019-02-06 DIAGNOSIS — R531 Weakness: Secondary | ICD-10-CM | POA: Diagnosis not present

## 2019-02-06 DIAGNOSIS — I129 Hypertensive chronic kidney disease with stage 1 through stage 4 chronic kidney disease, or unspecified chronic kidney disease: Secondary | ICD-10-CM | POA: Diagnosis not present

## 2019-02-06 DIAGNOSIS — D631 Anemia in chronic kidney disease: Secondary | ICD-10-CM | POA: Diagnosis not present

## 2019-02-06 DIAGNOSIS — N184 Chronic kidney disease, stage 4 (severe): Secondary | ICD-10-CM | POA: Diagnosis not present

## 2019-02-06 DIAGNOSIS — R2689 Other abnormalities of gait and mobility: Secondary | ICD-10-CM | POA: Diagnosis not present

## 2019-02-06 NOTE — Telephone Encounter (Addendum)
Received from [facility] relating to alternative formula.   Meloxicam 0.5%, gabapentin 6%, Lidocaine 2%, prilocaine 2%.  [phone], fx [phone].

## 2019-02-07 DIAGNOSIS — N184 Chronic kidney disease, stage 4 (severe): Secondary | ICD-10-CM | POA: Diagnosis not present

## 2019-02-07 DIAGNOSIS — D631 Anemia in chronic kidney disease: Secondary | ICD-10-CM | POA: Diagnosis not present

## 2019-02-07 DIAGNOSIS — E1122 Type 2 diabetes mellitus with diabetic chronic kidney disease: Secondary | ICD-10-CM | POA: Diagnosis not present

## 2019-02-07 DIAGNOSIS — R531 Weakness: Secondary | ICD-10-CM | POA: Diagnosis not present

## 2019-02-07 DIAGNOSIS — R2689 Other abnormalities of gait and mobility: Secondary | ICD-10-CM | POA: Diagnosis not present

## 2019-02-07 DIAGNOSIS — I129 Hypertensive chronic kidney disease with stage 1 through stage 4 chronic kidney disease, or unspecified chronic kidney disease: Secondary | ICD-10-CM | POA: Diagnosis not present

## 2019-02-13 DIAGNOSIS — I10 Essential (primary) hypertension: Secondary | ICD-10-CM | POA: Diagnosis not present

## 2019-02-13 DIAGNOSIS — I129 Hypertensive chronic kidney disease with stage 1 through stage 4 chronic kidney disease, or unspecified chronic kidney disease: Secondary | ICD-10-CM | POA: Diagnosis not present

## 2019-02-13 DIAGNOSIS — R2689 Other abnormalities of gait and mobility: Secondary | ICD-10-CM | POA: Diagnosis not present

## 2019-02-13 DIAGNOSIS — G609 Hereditary and idiopathic neuropathy, unspecified: Secondary | ICD-10-CM | POA: Diagnosis not present

## 2019-02-13 DIAGNOSIS — N184 Chronic kidney disease, stage 4 (severe): Secondary | ICD-10-CM | POA: Diagnosis not present

## 2019-02-13 DIAGNOSIS — E1165 Type 2 diabetes mellitus with hyperglycemia: Secondary | ICD-10-CM | POA: Diagnosis not present

## 2019-02-13 DIAGNOSIS — D631 Anemia in chronic kidney disease: Secondary | ICD-10-CM | POA: Diagnosis not present

## 2019-02-13 DIAGNOSIS — N189 Chronic kidney disease, unspecified: Secondary | ICD-10-CM | POA: Diagnosis not present

## 2019-02-13 DIAGNOSIS — E1122 Type 2 diabetes mellitus with diabetic chronic kidney disease: Secondary | ICD-10-CM | POA: Diagnosis not present

## 2019-02-13 DIAGNOSIS — E7800 Pure hypercholesterolemia, unspecified: Secondary | ICD-10-CM | POA: Diagnosis not present

## 2019-02-13 DIAGNOSIS — R531 Weakness: Secondary | ICD-10-CM | POA: Diagnosis not present

## 2019-02-16 DIAGNOSIS — I129 Hypertensive chronic kidney disease with stage 1 through stage 4 chronic kidney disease, or unspecified chronic kidney disease: Secondary | ICD-10-CM | POA: Diagnosis not present

## 2019-02-16 DIAGNOSIS — D631 Anemia in chronic kidney disease: Secondary | ICD-10-CM | POA: Diagnosis not present

## 2019-02-16 DIAGNOSIS — E876 Hypokalemia: Secondary | ICD-10-CM | POA: Diagnosis not present

## 2019-02-16 DIAGNOSIS — N184 Chronic kidney disease, stage 4 (severe): Secondary | ICD-10-CM | POA: Diagnosis not present

## 2019-02-16 DIAGNOSIS — Z171 Estrogen receptor negative status [ER-]: Secondary | ICD-10-CM | POA: Diagnosis not present

## 2019-02-16 DIAGNOSIS — Z7902 Long term (current) use of antithrombotics/antiplatelets: Secondary | ICD-10-CM | POA: Diagnosis not present

## 2019-02-16 DIAGNOSIS — R531 Weakness: Secondary | ICD-10-CM | POA: Diagnosis not present

## 2019-02-16 DIAGNOSIS — C4370 Malignant melanoma of unspecified lower limb, including hip: Secondary | ICD-10-CM | POA: Diagnosis not present

## 2019-02-16 DIAGNOSIS — C50911 Malignant neoplasm of unspecified site of right female breast: Secondary | ICD-10-CM | POA: Diagnosis not present

## 2019-02-16 DIAGNOSIS — N2581 Secondary hyperparathyroidism of renal origin: Secondary | ICD-10-CM | POA: Diagnosis not present

## 2019-02-16 DIAGNOSIS — R2689 Other abnormalities of gait and mobility: Secondary | ICD-10-CM | POA: Diagnosis not present

## 2019-02-16 DIAGNOSIS — E1122 Type 2 diabetes mellitus with diabetic chronic kidney disease: Secondary | ICD-10-CM | POA: Diagnosis not present

## 2019-02-16 DIAGNOSIS — Z79899 Other long term (current) drug therapy: Secondary | ICD-10-CM | POA: Diagnosis not present

## 2019-02-16 DIAGNOSIS — R32 Unspecified urinary incontinence: Secondary | ICD-10-CM | POA: Diagnosis not present

## 2019-02-16 DIAGNOSIS — Z794 Long term (current) use of insulin: Secondary | ICD-10-CM | POA: Diagnosis not present

## 2019-02-19 DIAGNOSIS — E1122 Type 2 diabetes mellitus with diabetic chronic kidney disease: Secondary | ICD-10-CM | POA: Diagnosis not present

## 2019-02-19 DIAGNOSIS — R531 Weakness: Secondary | ICD-10-CM | POA: Diagnosis not present

## 2019-02-19 DIAGNOSIS — I129 Hypertensive chronic kidney disease with stage 1 through stage 4 chronic kidney disease, or unspecified chronic kidney disease: Secondary | ICD-10-CM | POA: Diagnosis not present

## 2019-02-19 DIAGNOSIS — D631 Anemia in chronic kidney disease: Secondary | ICD-10-CM | POA: Diagnosis not present

## 2019-02-19 DIAGNOSIS — N184 Chronic kidney disease, stage 4 (severe): Secondary | ICD-10-CM | POA: Diagnosis not present

## 2019-02-19 DIAGNOSIS — R2689 Other abnormalities of gait and mobility: Secondary | ICD-10-CM | POA: Diagnosis not present

## 2019-02-26 ENCOUNTER — Other Ambulatory Visit: Payer: Medicare Other

## 2019-02-26 DIAGNOSIS — E1122 Type 2 diabetes mellitus with diabetic chronic kidney disease: Secondary | ICD-10-CM | POA: Diagnosis not present

## 2019-02-26 DIAGNOSIS — N184 Chronic kidney disease, stage 4 (severe): Secondary | ICD-10-CM | POA: Diagnosis not present

## 2019-02-26 DIAGNOSIS — I5032 Chronic diastolic (congestive) heart failure: Secondary | ICD-10-CM | POA: Diagnosis not present

## 2019-02-26 DIAGNOSIS — I130 Hypertensive heart and chronic kidney disease with heart failure and stage 1 through stage 4 chronic kidney disease, or unspecified chronic kidney disease: Secondary | ICD-10-CM | POA: Diagnosis not present

## 2019-02-27 DIAGNOSIS — D631 Anemia in chronic kidney disease: Secondary | ICD-10-CM | POA: Diagnosis not present

## 2019-02-27 DIAGNOSIS — R2689 Other abnormalities of gait and mobility: Secondary | ICD-10-CM | POA: Diagnosis not present

## 2019-02-27 DIAGNOSIS — I129 Hypertensive chronic kidney disease with stage 1 through stage 4 chronic kidney disease, or unspecified chronic kidney disease: Secondary | ICD-10-CM | POA: Diagnosis not present

## 2019-02-27 DIAGNOSIS — N184 Chronic kidney disease, stage 4 (severe): Secondary | ICD-10-CM | POA: Diagnosis not present

## 2019-02-27 DIAGNOSIS — R531 Weakness: Secondary | ICD-10-CM | POA: Diagnosis not present

## 2019-02-27 DIAGNOSIS — E1122 Type 2 diabetes mellitus with diabetic chronic kidney disease: Secondary | ICD-10-CM | POA: Diagnosis not present

## 2019-03-05 ENCOUNTER — Telehealth: Payer: Self-pay | Admitting: Hematology

## 2019-03-05 NOTE — Telephone Encounter (Signed)
Called patient per providers request to convert follow-up visit to virtual visit, patient is notified. Labs cancelled.

## 2019-03-07 DIAGNOSIS — R531 Weakness: Secondary | ICD-10-CM | POA: Diagnosis not present

## 2019-03-07 DIAGNOSIS — R2689 Other abnormalities of gait and mobility: Secondary | ICD-10-CM | POA: Diagnosis not present

## 2019-03-07 DIAGNOSIS — E1122 Type 2 diabetes mellitus with diabetic chronic kidney disease: Secondary | ICD-10-CM | POA: Diagnosis not present

## 2019-03-07 DIAGNOSIS — D631 Anemia in chronic kidney disease: Secondary | ICD-10-CM | POA: Diagnosis not present

## 2019-03-07 DIAGNOSIS — N184 Chronic kidney disease, stage 4 (severe): Secondary | ICD-10-CM | POA: Diagnosis not present

## 2019-03-07 DIAGNOSIS — I129 Hypertensive chronic kidney disease with stage 1 through stage 4 chronic kidney disease, or unspecified chronic kidney disease: Secondary | ICD-10-CM | POA: Diagnosis not present

## 2019-03-08 NOTE — Progress Notes (Signed)
[name]   [phone] [phone]   Clinic Follow up Note   Patient Care Team: Redmond School, MD as PCP - General (Internal Medicine) Leonie Man, MD as PCP - Cardiology (Cardiology) Elmarie Shiley, MD as Consulting Physician (Nephrology) Leonie Man, MD as Consulting Physician (Cardiology) Alphonsa Overall, MD as Consulting Physician (General Surgery) Kyung Rudd, MD as Consulting Physician (Radiation Oncology) Truitt Merle, MD as Consulting Physician (Hematology) Delice Bison, Charlestine Massed, NP as Nurse Practitioner (Hematology and Oncology) Pleasant, Eppie Gibson, RN as Hall Management   I connected with Duffy Rhody Calleros on 03/12/2019 at 11:00 AM EST by telephone visit and verified that I am speaking with the correct person using two identifiers.  I discussed the limitations, risks, security and privacy concerns of performing an evaluation and management service by telephone and the availability of in person appointments. I also discussed with the patient that there may be a patient responsible charge related to this service. The patient expressed understanding and agreed to proceed.   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, estrogen receptor positive ([place]) Staging form: Breast, AJCC 8th Edition - Clinical: Stage IA (cT1c, cN0, cM0, G1, ER: Positive, PR: Positive, HER2: Negative) - Unsigned     Malignant 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, estrogen receptor positive ([place])  10/20/2016 Initial Biopsy   Diagnosis Breast, right, needle core biopsy, 11:30 o'clock - INVASIVE DUCTAL  CARCINOMA,   10/20/2016 Initial Diagnosis   Malignant 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, estrogen receptor positive ([place])   10/20/2016 Receptors her2   Estrogen Receptor: 100%, POSITIVE, STRONG STAINING INTENSITY Progesterone Receptor: 100%, POSITIVE, STRONG STAINING INTENSITY Proliferation Marker Ki67: 5% HER2 NEGATIVE    10/20/2016 Mammogram   Diagnostic mammogram showed persistent distortion corresponds to a vaguely palpable irregular mass and is suspicious for malignancy.Targeted ultrasound is performed, showing an irregular hypoechoic mass with posterior acoustic shadowing in the 11:30 o'clock location of the right breast 4 cm from the nipple which measures 0.9 x 0.9 x0.8 cm. There is associated internal vascularity. Evaluation of the axilla is negative for adenopathy.   11/01/2016 Imaging   B/l BREAST MRI: IMPRESSION: 1. There is a 1.5 cm biopsy proven malignancy in the upper slightly outer quadrant of the right breast. No additional sites of disease are identified.  2. There is an indeterminate 6 mm enhancing mass in the upper-outer left breast.   11/11/2016 Pathology Results   Diagnosis Breast, left, needle core biopsy, upper outer - INVASIVE DUCTAL CARCINOMA. - ATYPICAL LOBULAR HYPERPLASIA. - FIBROCYSTIC CHANGES AND PSEUDOANGIOMATOUS STROMAL HYPERPLASIA ([place]).   11/11/2016 Receptors her2    Estrogen Receptor: 100%, POSITIVE, STRONG STAINING INTENSITY Progesterone Receptor: 10%, POSITIVE, WEAK STAINING INTENSITY Proliferation Marker Ki67: 2% HER2 NEGATIVE    12/03/2016 Genetic Testing   Germline genetic testing was performed through [employer]'s Common Hereditary Cancers Panel + [employer]'s Melanoma Panel. This custom panel includes analysis of the following 51 genes: APC, ATM, AXIN2, BAP1, BARD1, BMPR1A, BRCA1, BRCA2, BRIP1, CDH1, CDK4, CDKN2A, CHEK2, CTNNA1, DICER1, EPCAM, GREM1, HOXB13, KIT, MEN1, MITF, MLH1, MSH2, MSH3, MSH6, MUTYH, NBN, NF1, NTHL1,  PALB2, PDGFRA, PMS2, POLD1, POLE, POT1,  PTEN, RAD50, RAD51C, RAD51D, RB1, SDHA, SDHB, SDHC, SDHD, SMAD4, SMARCA4, STK11, TP53, TSC1, TSC2, and VHL.  A variant of uncertain significance (VUS) was noted in BARD1.    12/14/2016 Miscellaneous   Oncotype Recurrence score of 16 10-year risk of distant recurrence with Tamoxifen alone is 10%   12/14/2016 Surgery   BILATERAL BREAST LUMPECTOMY WITH BILATERAL  RADIOACTIVE SEED AND BILATERAL AXILLARY  SENTINEL LYMPH NODE BIOPSY ERAS PATHWAY by Dr. Lucia Gaskins on 12/14/16   12/14/2016 Pathology Results   Diagnosis 12/14/16 1. Breast, lumpectomy, Left w/seed - INVASIVE DUCTAL CARCINOMA, 1 CM. - ATYPICAL LOBULAR HYPERPLASIA ([place]). - PREVIOUS BIOPSY SITE. - INVASIVE CARCINOMA 0.2 CM FROM LATERAL MARGIN. - ATYPICA LOBULAR HYPERPLASIA LESS THAN 0.1 CM FROM LATERAL MARGIN. 2. Breast, lumpectomy, Right w/seed - INVASIVE DUCTAL CARCINOMA, 1.1 CM. - FIBROCYSTIC CHANGES. - PREVIOUS BIOPSY SITE. - INVASIVE CARCINOMA 1.1 CM FROM POSTERIOR MARGIN. 3. Lymph node, sentinel, biopsy, Right Axillary Contents - BENIGN FIBROADIPOSE TISSUE. - NO LYMPH NODE TISSUE OR MALIGNANCY. 4. Lymph node, sentinel, biopsy, Left Axillary - ONE BENIGN LYMPH NODE (0/1).    01/17/2017 - 02/11/2017 Radiation Therapy   1. The Left breast was treated to 42.5 Gy in 17 fractions of 2.5 Gy.  2. The Right breast was treated to 42.5 Gy in 17 fractions of 2.5 Gy.  3. The Right breast was boosted to 7.5 Gy in 3 fractions of 2.5 Gy.  4. The Left breast was boosted to 7.5 Gy in 3 fractions of 2.5 Gy.       02/2017 -  Anti-estrogen oral therapy   Exemestane daily      CURRENT THERAPY:  Adjuvant Exemestane started on 03/07/17  INTERVAL HISTORY:  [name] is here for a follow up of b/l breast cancer.  She notes having feet issues from right foot surgery by Dr. Lendon Ka. 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 She took PT but that has only  helped some. She cannot walk far without being exhausted. She feels she needs a diabetic podiatrist. She was referred to one but was not covered in insurance. She notes she needs refill of exemestane.  She notes having neuralgia of right shoulder since right breast surgery. She notes prior breast exam shows thickening of her breast. Her Mammogram was benign in 09/2018.    REVIEW OF SYSTEMS:   Constitutional: Denies fevers, chills or abnormal weight loss Eyes: Denies blurriness of vision Ears, nose, mouth, throat, and face: Denies mucositis or sore throat Respiratory: Denies cough, dyspnea or wheezes Cardiovascular: Denies palpitation, chest discomfort or lower extremity swelling Gastrointestinal:  Denies nausea, heartburn or change in bowel habits Skin: Denies abnormal skin rashes MSK: (+) laterally turned feet with improved balance  Lymphatics: Denies new lymphadenopathy or easy bruising Neurological:Denies numbness, tingling or new weaknesses (+) Neuralgia of right shoulder Behavioral/Psych: Mood is stable, no new changes  All other systems were reviewed with the patient and are negative.  MEDICAL HISTORY:  Past Medical History:  Diagnosis Date  . Anemia    have received iron infusions  . Anxiety   . Arthritis     osteoarthritis  . Breast cancer in female [facility])    Bilateral  . CAD S/P percutaneous coronary angioplasty 2007; March 2011   [employer] 3.0 mm 20 mm postdilated 3.6 mm in early mid LAD; status post ISR Cutting Balloon PTCA and March 11 along with PCI of distal mid lesion with a 3.0 mm 12 mm  MultiLink vision BMS; the proximal stent causes jailing of SP1 and SP2 with ostial 70-80% lesions  . Cancer (HCC)    Melanoma, Squamous cell Carcinoma  . Chronic diastolic CHF (congestive heart failure), NYHA class 2 ([place]) 02/28/2018  . Chronic kidney disease    stage 2   . Dementia ([place])    mild  . Diabetes mellitus type 2 with neurological manifestations ([place])   . Dyslipidemia,  goal LDL below 70   . Full dentures   . Genetic testing 12/03/2016   Germline genetic testing was performed through [employer]'s Common Hereditary Cancers Panel + [employer]'s Melanoma Panel. This custom panel includes analysis of the following 51 genes: APC, ATM, AXIN2, BAP1, BARD1, BMPR1A, BRCA1, BRCA2, BRIP1, CDH1, CDK4, CDKN2A, CHEK2, CTNNA1, DICER1, EPCAM, GREM1, HOXB13, KIT, MEN1, MITF, MLH1, MSH2, MSH3, MSH6, MUTYH, NBN, NF1, NTHL1, PALB2, PDGFRA, PMS2, POLD1, POL  . GERD (gastroesophageal reflux disease)   . Headache(784.0)   . History of hematuria    Followed by Dr. Gaynelle Arabian  . Hypertension, essential, benign   . Peripheral neuropathy   . Personal history of radiation therapy   . Stroke ([place])   . TIA (transient ischemic attack)    multiple in the past  . Wears glasses     SURGICAL HISTORY: Past Surgical History:  Procedure Laterality Date  . ABDOMINAL HYSTERECTOMY    . ANKLE FUSION Right 02/22/2018   Procedure: RIGHT SUBTALAR AND TALONAVICULAR FUSION;  Surgeon: Newt Minion, MD;  Location: [facility];  Service: Orthopedics;  Laterality: Right;  . APPENDECTOMY    . BLADDER SUSPENSION    . BREAST LUMPECTOMY Bilateral 2018  . BREAST LUMPECTOMY WITH RADIOACTIVE SEED AND SENTINEL LYMPH NODE BIOPSY Bilateral 12/14/2016   Procedure: BILATERAL BREAST LUMPECTOMY WITH BILATERAL  RADIOACTIVE SEED AND BILATERAL AXILLARY  SENTINEL LYMPH NODE BIOPSY ERAS PATHWAY;  Surgeon: Alphonsa Overall, MD;  Location: [place];  Service: General;  Laterality: Bilateral;  PECTORAL BLOCK  . CARDIAC CATHETERIZATION  02/24/2006   85% stenosis in the proximal portion of LAD-arrangements made for PCI on 02/25/2006  . CARDIAC CATHETERIZATION  02/25/2006   80% LAD lesion stented with a 3x30m [employer] stent resulting in reduction of 80% lesion to 0% residual  . CARDIAC CATHETERIZATION  04/26/2006   Medical management  . CARDIAC CATHETERIZATION  06/24/2009   60-70% re-stenosis in the proximal LAD. A 3.25x15 cutting balloon, 3  inflations - 14atm-38sec, 13atm-39sec, and 12atm-40sec reduced to less than 10%. 60% stenosis of the mid/distal LAD stented with a 3x172mMultilink stent.  . Marland KitchenARDIAC CATHETERIZATION  07/09/2009   Medical management  . CAROTID DOPPLER  06/24/2009   40-59% R ICA stenosis. No significant ICA stenosis noted  . CHOLECYSTECTOMY    . DILATION AND CURETTAGE OF UTERUS    . JOINT REPLACEMENT Left   . LESION REMOVAL Right 03/11/2015   Procedure:  EXCISION OF RIGHT PRE TIBIAL LESION;  Surgeon: JaJudeth HornMD;  [place] Service: General;  Laterality: Right;  . MASS EXCISION Left 06/10/2014   Procedure: EXCISION LEFT LOWER LEG LESION;  Surgeon: JaDoreen SalvageMD;  [place] Service: General;  Laterality: Left;  . Marland KitchenASS EXCISION Left 09/05/2015   Procedure: EXCISION OF LEFT FOREARM  MASS;  Surgeon: JaJudeth HornMD;  [place] Service: General;  Laterality: Left;  . NM MYOVIEW LTD  11/16/2011   5 beats a PVC during recovery.  No skin or infarction.  Reached 5 METs,  EKG negative for ischemia   . NM MYOVIEW LTD  10/2011   No ischemia or infarction.  Normal EF.  LOW RISK. (Short bursts of 5 beats NSVT during recovery otherwise normal)  . SHOULDER ARTHROSCOPY  10/15  . SHOULDER ARTHROSCOPY  1995   left  . TEE WITHOUT CARDIOVERSION N/A 08/03/2017   Procedure: TRANSESOPHAGEAL ECHOCARDIOGRAM (TEE);  Surgeon: Skeet Latch, MD;  Location: [facility];  Service: Cardiovascular;; EF 60-65% with no wall motion normalities.  No atrial thrombus noted.  Lightly findings consistent with a lipomatous hypertrophy of the atrial septum.   . TENDON REPAIR Left 05/12/2016   Procedure: Left Anterior Tibial Tendon Reconstruction;  Surgeon: Newt Minion, MD;  Location: [place];  Service: Orthopedics;  Laterality: Left;  . TOTAL KNEE ARTHROPLASTY  2005   left  . TRANSESOPHAGEAL ECHOCARDIOGRAM  08/03/2016   : EF 60-65% no wall motion normalities.  No  atrial thrombus.  Lipomatous hypertrophy of the atrial septum.  No mass noted.  . TRANSTHORACIC ECHOCARDIOGRAM  10/2015   EF 55-60 %. Mild LVH. Mild pulmonary hypertension. Normal valves.  . TRANSTHORACIC ECHOCARDIOGRAM  07/2017   Vigorous LV function.  EF 65-70%.  Mild diastolic dysfunction with no RWMA. GR 1 DD.  Mild aortic sclerosis/no stenosis.  Questionable right atrial mass discounted with TEE)   . WRIST ARTHROPLASTY  2010   cancer lt wrist    I have reviewed the social history and family history with the patient and they are unchanged from previous note.  ALLERGIES:  is allergic to ultram [tramadol]; lipitor [atorvastatin]; lyrica [pregabalin]; reglan [metoclopramide]; and nsaids.  MEDICATIONS:  Current Outpatient Medications  Medication Sig Dispense Refill  . bumetanide (BUMEX) 2 MG tablet Take 1 tablet (2 mg total) by mouth 2 (two) times daily. (Patient taking differently: Take 2 mg by mouth 2 (two) times daily. 2 tablets in the morning and 1 at noon) 60 tablet 0  . Calcium Carb-Cholecalciferol (CALCIUM 600+D3) 600-800 MG-UNIT TABS Take 1 tablet by mouth daily.    . clopidogrel (PLAVIX) 75 MG tablet TAKE 1 TABLET DAILY (Patient taking differently: Take 75 mg by mouth daily. ) 90 tablet 4  . COLCRYS 0.6 MG tablet TAKE 1 TABLET DAILY AS NEEDED FOR GOUT FLARE. 90 tablet 3  . Cranberry 500 MG TABS Take 500 mg by mouth every evening.    . diazepam (VALIUM) 5 MG tablet Take 1 tablet (5 mg total) by mouth 2 (two) times daily as needed for anxiety. 10 tablet 0  . exemestane (AROMASIN) 25 MG tablet TAKE 1 TABLET DAILY AFTER BREAKFAST 90 tablet 3  . febuxostat (ULORIC) 40 MG tablet Take 1 tablet (40 mg total) by mouth daily. 90 tablet 3  . gabapentin (NEURONTIN) 300 MG capsule Take 300-600 mg by mouth daily as needed (nerve pain).    Marland Kitchen gabapentin (NEURONTIN) 300 MG capsule Take 1 capsule (300 mg total) by mouth 2 (two) times daily. Take 1 during the day if needed and 2 at night 270 capsule 1   . Insulin Lispro (HUMALOG KWIKPEN [place]) Inject into the skin. 100-150= 2 units, 151-200= 3 units and 201-250= 4 units    . insulin NPH Human (HUMULIN N,NOVOLIN N) 100 UNIT/ML injection Inject 20 Units into the skin See admin instructions. Inject 20 units in the morning & inject 25 units after supper.    . insulin NPH-regular Human (70-30) 100 UNIT/ML injection Inject 20 Units into the skin 2 (two) times daily with a meal.    .  Krill Oil 500 MG CAPS Take 500 mg by mouth daily.    . Methylfol-Methylcob-Acetylcyst (CEREFOLIN NAC) 6-2-600 MG TABS TAKE 1 CAPLET BY MOUTH ONCE DAILY. 90 each 3  . metolazone (ZAROXOLYN) 2.5 MG tablet TAKE 1 TABLET DAILY 30 MINUTES BEFORE THE MORNING DOSE OF BUMEX AS DIRECTED 90 tablet 1  . metoprolol succinate (TOPROL XL) 25 MG 24 hr tablet Take 0.5 tablets (12.5 mg total) by mouth at bedtime. 45 tablet 3  . mirtazapine (REMERON) 15 MG tablet Take 15 mg by mouth at bedtime. Taking 1 1/2 tablet at bedtime    . nitroGLYCERIN (NITROSTAT) 0.4 MG SL tablet Place 0.4 mg under the tongue every 5 (five) minutes as needed for chest pain.    Marland Kitchen oxyCODONE-acetaminophen (PERCOCET/ROXICET) 5-325 MG tablet Take 1 tablet by mouth every 6 (six) hours as needed for severe pain. 28 tablet 0  . pantoprazole (PROTONIX) 40 MG tablet TAKE 1 TABLET DAILY (CONTACT OFFICE FOR ADDITIONAL REFILLS, FINAL ATTEMPT) (Patient taking differently: Take 40 mg by mouth daily before breakfast. ) 90 tablet 0  . potassium chloride (KLOR-CON) 20 MEQ packet Take 20 mEq by mouth 2 (two) times daily. 2 tablets in the morning and 1 at bedtime    . pramipexole (MIRAPEX) 0.25 MG tablet Take 1-2 tablets (0.25-0.5 mg total) by mouth See admin instructions. TAKE 1 TABLET AT 4:00 P.M. AND 2 TABLETS (0.5 MG) AT BEDTIME 270 tablet 1  . rosuvastatin (CRESTOR) 5 MG tablet Take 5 mg by mouth daily.    . sennosides-docusate sodium (SENOKOT-S) 8.6-50 MG tablet Take 1 tablet by mouth daily as needed for constipation.    Marland Kitchen Ubiquinol  (QUNOL COQ10/UBIQUINOL/MEGA) 100 MG CAPS Take 100 mg by mouth every evening. CoQ10, Qunol      No current facility-administered medications for this visit.    PHYSICAL EXAMINATION: ECOG PERFORMANCE STATUS: 3 - Symptomatic, >50% confined to bed  No vitals taken today, Exam not performed today   LABORATORY DATA:  I have reviewed the data as listed CBC Latest Ref Rng & Units 10/04/2018 03/04/2018 03/01/2018  WBC 4.0 - 10.5 K/uL 10.0 9.1 10.1  Hemoglobin 12.0 - 15.0 g/dL 12.1 11.3(L) 9.6(L)  Hematocrit 36.0 - 46.0 % 38.1 36.8 30.1(L)  Platelets 150 - 400 K/uL 259 400 248     CMP Latest Ref Rng & Units 10/04/2018 03/04/2018 03/02/2018  Glucose 70 - 99 mg/dL 159(H) 106(H) 121(H)  BUN 8 - 23 mg/dL 42(H) 20 25(H)  Creatinine 0.44 - 1.00 mg/dL 1.89(H) 1.70(H) 1.83(H)  Sodium 135 - 145 mmol/L 136 139 137  Potassium 3.5 - 5.1 mmol/L 3.0(L) 4.2 4.9  Chloride 98 - 111 mmol/L 96(L) 100 99  CO2 22 - 32 mmol/L _0 Calcium 8.9 - 10.3 mg/dL 9.5 9.6 9.3  Total Protein 6.5 - 8.1 g/dL 6.5 - -  Total Bilirubin 0.3 - 1.2 mg/dL 0.8 - -  Alkaline Phos 38 - 126 U/L 95 - -  AST 15 - 41 U/L 24 - -  ALT 0 - 44 U/L 17 - -      RADIOGRAPHIC STUDIES: I have personally reviewed the radiological images as listed and agreed with the findings in the report. No results found.   ASSESSMENT & PLAN:  Bennett Vanscyoc is a 78 y.o. 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 She is  s/p b/l breast lumpectomy and has adjuvant radiation. 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  -She started antiestrogen therapy with Exemestane in 02/2017. She is tolerating well with no side effects -From a breast cancer standpoint she is clinically stable. She notes having neuralgia of right shoulder from breast surgery. She  notes prior breast exam shows thickening of her breast at [facility]. Her Mammogram was benign in 09/2018. There is no clinical concern for recurrence.  -Continue surveillance. Next mammogram in 09/2019  -Continue Exemestane -F/u with Dr Lucia Gaskins next week. F/u with me in 6 months.   2. Genetics Negative for pathogenetic mutations.   3. HTN, DM, CAD and CHF with LE edema  -DM controlled -continue f/u withPAP, nephrologist [name], and her cardiologist [name]  4. CKD, Stage IV -She will continue to follow up with [name]  5. Arthritis,diabetic foot, balance issue  -Probably related to her DM -She underwent right foot surgery in 01/2018. She had sepsis afterward, has resolved/recovered.  -She has laterally had significant foot pain which limits her ambulatory and balance. She has completed PT with only some improvement in her balance and still has exhaustion with mild ambulation.  -She is currently looking for diabetic podiatrist. I can refer her if needed.   6. Osteopenia  -2016 DEXA shows right femur with osteopenia. DEXA from 02/24/17 results with a T score of -1.6 -continue calcium and vit D -Next DEXA in 09/2019 with mammogram   7. Right shoulder neuralgia  -Pt attributes this to her prior breast surgery  -Will monitor.    PLAN -Continue Exemestane, refilled today  -Lab and f/u in 6 months -Mammogram and DEXA in 09/2019     No problem-specific Assessment & Plan notes found for this encounter.   Orders Placed This Encounter  Procedures  . MM DIAG BREAST TOMO BILATERAL    Standing Status:   Future    Standing Expiration Date:   03/11/2020    Order Specific Question:   Reason for Exam (SYMPTOM  OR DIAGNOSIS REQUIRED)    Answer:   screening    Order Specific Question:   Preferred imaging location?    Answer:   [facility]   I discussed the assessment and treatment plan with the patient. The patient was provided an opportunity to ask questions  and all were answered. The patient agreed with the plan and demonstrated an understanding of the instructions.  The patient was advised to call back or seek an in-person evaluation if the symptoms worsen or if the condition fails to improve as anticipated.  I provided 15 minutes of non face-to-face telephone visit time during this encounter, and > 50% was spent counseling as documented under my assessment & plan.    Truitt Merle, MD 03/12/2019   I, Joslyn Devon, am acting as scribe for Truitt Merle, MD.   I have reviewed the above documentation for accuracy and completeness, and I agree with the above.

## 2019-03-12 ENCOUNTER — Encounter: Payer: Self-pay | Admitting: Hematology

## 2019-03-12 ENCOUNTER — Other Ambulatory Visit: Payer: Self-pay | Admitting: Hematology

## 2019-03-12 ENCOUNTER — Inpatient Hospital Stay: Payer: Medicare Other

## 2019-03-12 ENCOUNTER — Inpatient Hospital Stay: Payer: Medicare Other | Attending: Hematology | Admitting: Hematology

## 2019-03-12 DIAGNOSIS — C50412 Malignant neoplasm of upper-outer quadrant of left female breast: Secondary | ICD-10-CM

## 2019-03-12 DIAGNOSIS — Z170 Estrogen receptor positive status [ER+]: Secondary | ICD-10-CM

## 2019-03-12 DIAGNOSIS — C50211 Malignant neoplasm of upper-inner quadrant of right female breast: Secondary | ICD-10-CM | POA: Diagnosis not present

## 2019-03-12 MED ORDER — EXEMESTANE 25 MG PO TABS: ORAL_TABLET | ORAL | 3 refills | Status: DC

## 2019-03-13 ENCOUNTER — Telehealth: Payer: Self-pay | Admitting: Hematology

## 2019-03-13 NOTE — Telephone Encounter (Signed)
Scheduled appt per 12/14 los.  Printed and mailed appt calendar  

## 2019-03-16 NOTE — Telephone Encounter (Signed)
Opened by accident

## 2019-03-19 ENCOUNTER — Other Ambulatory Visit: Payer: Self-pay | Admitting: *Deleted

## 2019-03-19 NOTE — Patient Outreach (Signed)
[facility]) Care Management  [place]  03/19/2019   [name] [birth year] [id number]   RN Health Coach telephone call to patient.  Hipaa compliance verified. Patient is having some swelling in feet. Per patient she is weighing and thinks it is from her inactivity.Patient stated that she is having a lot of pain in her feet. Patient has not went back to the Dr for follow up. Patient has finished physical therapy.  RN talked about CHF exacerbation and fluid. Patient stated that [name] husband is the care giver and makes all her meals. [name] stated some of the foods he cook such as sweet potatoes, chicken, salads and trying to make sure she eats at least two meals a day. Patient and husband have agreed to follow up outreach calls.    Encounter Medications:  Outpatient Encounter Medications as of 03/19/2019  Medication Sig Note  . bumetanide (BUMEX) 2 MG tablet Take 1 tablet (2 mg total) by mouth 2 (two) times daily. (Patient taking differently: Take 2 mg by mouth 2 (two) times daily. 2 tablets in the morning and 1 at noon)   . Calcium Carb-Cholecalciferol (CALCIUM 600+D3) 600-800 MG-UNIT TABS Take 1 tablet by mouth daily.   . clopidogrel (PLAVIX) 75 MG tablet TAKE 1 TABLET DAILY (Patient taking differently: Take 75 mg by mouth daily. )   . COLCRYS 0.6 MG tablet TAKE 1 TABLET DAILY AS NEEDED FOR GOUT FLARE.   Marland Kitchen Cranberry 500 MG TABS Take 500 mg by mouth every evening.   . diazepam (VALIUM) 5 MG tablet Take 1 tablet (5 mg total) by mouth 2 (two) times daily as needed for anxiety.   Marland Kitchen exemestane (AROMASIN) 25 MG tablet TAKE 1 TABLET DAILY AFTER BREAKFAST   . febuxostat (ULORIC) 40 MG tablet Take 1 tablet (40 mg total) by mouth daily.   Marland Kitchen gabapentin (NEURONTIN) 300 MG capsule Take 300-600 mg by mouth daily as needed (nerve pain).   Marland Kitchen gabapentin (NEURONTIN) 300 MG capsule Take 1 capsule (300 mg total) by mouth 2 (two) times daily. Take 1 during the day if needed and 2  at night   . Insulin Lispro (HUMALOG KWIKPEN [place]) Inject into the skin. 100-150= 2 units, 151-200= 3 units and 201-250= 4 units   . insulin NPH Human (HUMULIN N,NOVOLIN N) 100 UNIT/ML injection Inject 20 Units into the skin See admin instructions. Inject 20 units in the morning & inject 25 units after supper. 02/24/2018: Took 20 units 02/24/18  . insulin NPH-regular Human (70-30) 100 UNIT/ML injection Inject 20 Units into the skin 2 (two) times daily with a meal.   . Krill Oil 500 MG CAPS Take 500 mg by mouth daily.   . Methylfol-Methylcob-Acetylcyst (CEREFOLIN NAC) 6-2-600 MG TABS TAKE 1 CAPLET BY MOUTH ONCE DAILY.   . metolazone (ZAROXOLYN) 2.5 MG tablet TAKE 1 TABLET DAILY 30 MINUTES BEFORE THE MORNING DOSE OF BUMEX AS DIRECTED   . metoprolol succinate (TOPROL XL) 25 MG 24 hr tablet Take 0.5 tablets (12.5 mg total) by mouth at bedtime.   . mirtazapine (REMERON) 15 MG tablet Take 15 mg by mouth at bedtime. Taking 1 1/2 tablet at bedtime   . nitroGLYCERIN (NITROSTAT) 0.4 MG SL tablet Place 0.4 mg under the tongue every 5 (five) minutes as needed for chest pain. 03/23/2018: Has on hand  . oxyCODONE-acetaminophen (PERCOCET/ROXICET) 5-325 MG tablet Take 1 tablet by mouth every 6 (six) hours as needed for severe pain.   . pantoprazole (PROTONIX) 40 MG  tablet TAKE 1 TABLET DAILY (CONTACT OFFICE FOR ADDITIONAL REFILLS, FINAL ATTEMPT) (Patient taking differently: Take 40 mg by mouth daily before breakfast. )   . potassium chloride (KLOR-CON) 20 MEQ packet Take 20 mEq by mouth 2 (two) times daily. 2 tablets in the morning and 1 at bedtime   . pramipexole (MIRAPEX) 0.25 MG tablet Take 1-2 tablets (0.25-0.5 mg total) by mouth See admin instructions. TAKE 1 TABLET AT 4:00 P.M. AND 2 TABLETS (0.5 MG) AT BEDTIME   . rosuvastatin (CRESTOR) 5 MG tablet Take 5 mg by mouth daily.   . sennosides-docusate sodium (SENOKOT-S) 8.6-50 MG tablet Take 1 tablet by mouth daily as needed for constipation.   Marland Kitchen Ubiquinol (QUNOL  COQ10/UBIQUINOL/MEGA) 100 MG CAPS Take 100 mg by mouth every evening. CoQ10, Qunol     No facility-administered encounter medications on file as of 03/19/2019.    Functional Status:  In your present state of health, do you have any difficulty performing the following activities: 03/19/2019 01/16/2019  Hearing? N -  Vision? N N  Difficulty concentrating or making decisions? Y Y  Comment - short term memory loss  Walking or climbing stairs? Y Y  Comment - uses a cane at all times  Dressing or bathing? Y Y  Comment - Husband assist with washing the patient hair and bathing  Doing errands, shopping? Y Y  Comment - husband  helps  Conservation officer, nature and eating ? Y Y  Comment - husband prepares food  Using the Toilet? N N  In the past six months, have you accidently leaked urine? N N  Do you have problems with loss of bowel control? N N  Managing your Medications? Y Y  Comment - husband organizes medications  Managing your Finances? Y Y  Comment - husband handles all  Housekeeping or managing your Housekeeping? Y Y  Comment - husband handles  Some recent data might be hidden    Fall/Depression Screening: Fall Risk  03/19/2019 01/16/2019 10/25/2018  Falls in the past year? 1 1 1   Number falls in past yr: 1 1 1   Comment - - this patient has had 3 falls within the last 3 months  Injury with Fall? 0 1 0  Risk for fall due to : History of fall(s);Impaired balance/gait;Impaired mobility History of fall(s);Impaired balance/gait;Impaired mobility History of fall(s);Impaired balance/gait;Impaired mobility  Follow up Falls evaluation completed;Falls prevention discussed;Education provided Falls evaluation completed;Education provided;Falls prevention discussed Falls evaluation completed;Education provided;Falls prevention discussed   PHQ 2/9 Scores 03/19/2019 01/16/2019 10/25/2018 07/24/2018 03/31/2018 08/23/2017 03/21/2017  PHQ - 2 Score 0 0 0 0 0 1 1    Assessment:  Patient checks  weights Patient is in a lot of feet pain Patient will continue to benefit from [place] telephonic outreach for education and support for Congestive Heart Failure self management.  Plan:  RN discussed fall prevention RN discussed CHF exacerbation RN sent 2021 Calendar book RN discussed daily weights, CBG monitoring and blood pressure RN sent clinical education on Heart Healthy diet RN sent a picture sheet of foods high and low in sodium RN sent educational material on diabetic diet RN will follow up within the month of March.  [facility] [phone]

## 2019-03-29 DIAGNOSIS — E1140 Type 2 diabetes mellitus with diabetic neuropathy, unspecified: Secondary | ICD-10-CM | POA: Diagnosis not present

## 2019-03-29 DIAGNOSIS — I2510 Atherosclerotic heart disease of native coronary artery without angina pectoris: Secondary | ICD-10-CM | POA: Diagnosis not present

## 2019-03-29 DIAGNOSIS — E7849 Other hyperlipidemia: Secondary | ICD-10-CM | POA: Diagnosis not present

## 2019-03-29 DIAGNOSIS — I10 Essential (primary) hypertension: Secondary | ICD-10-CM | POA: Diagnosis not present

## 2019-04-09 IMAGING — MG DIGITAL DIAGNOSTIC BILATERAL MAMMOGRAM WITH TOMO AND CAD
6 of 10 series · 6 of 26 positions shown · non-contrast
Comparison: Previous exam(s).

CLINICAL DATA: History of bilateral lumpectomies for breast
carcinoma in 4295.Patient has some right lateral breast pain. No
other breast complaints.

EXAM:
DIGITAL DIAGNOSTIC BILATERAL MAMMOGRAM WITH CAD AND TOMO

[L MLO]
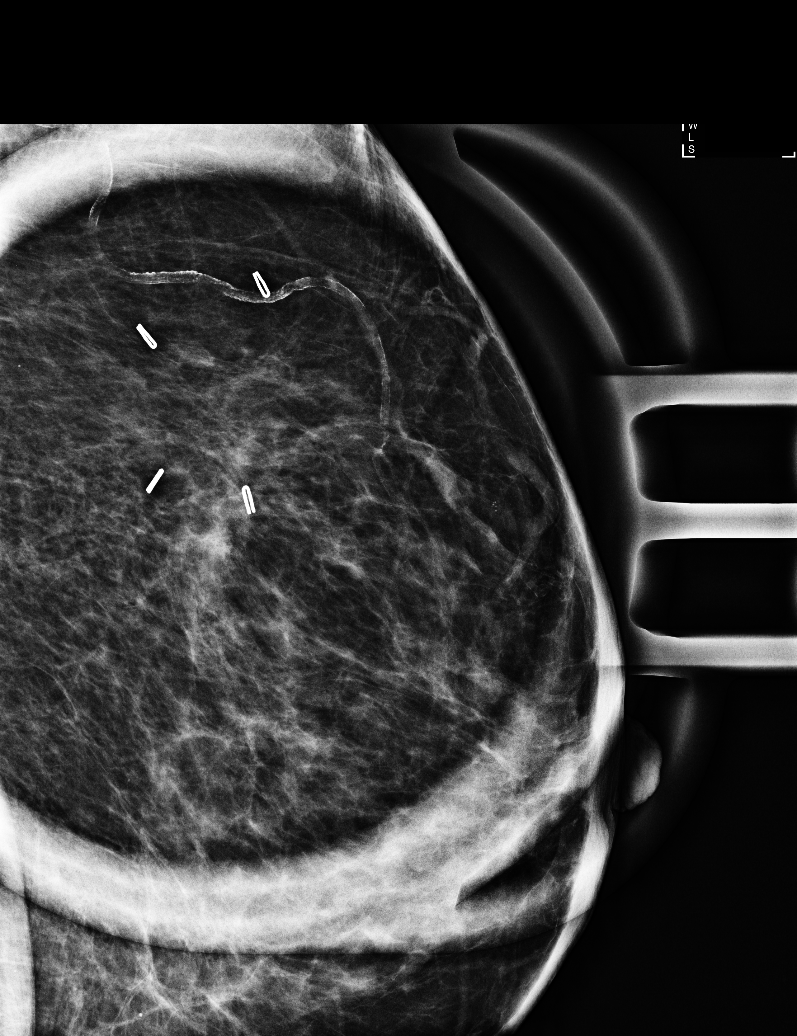

[R MLO]
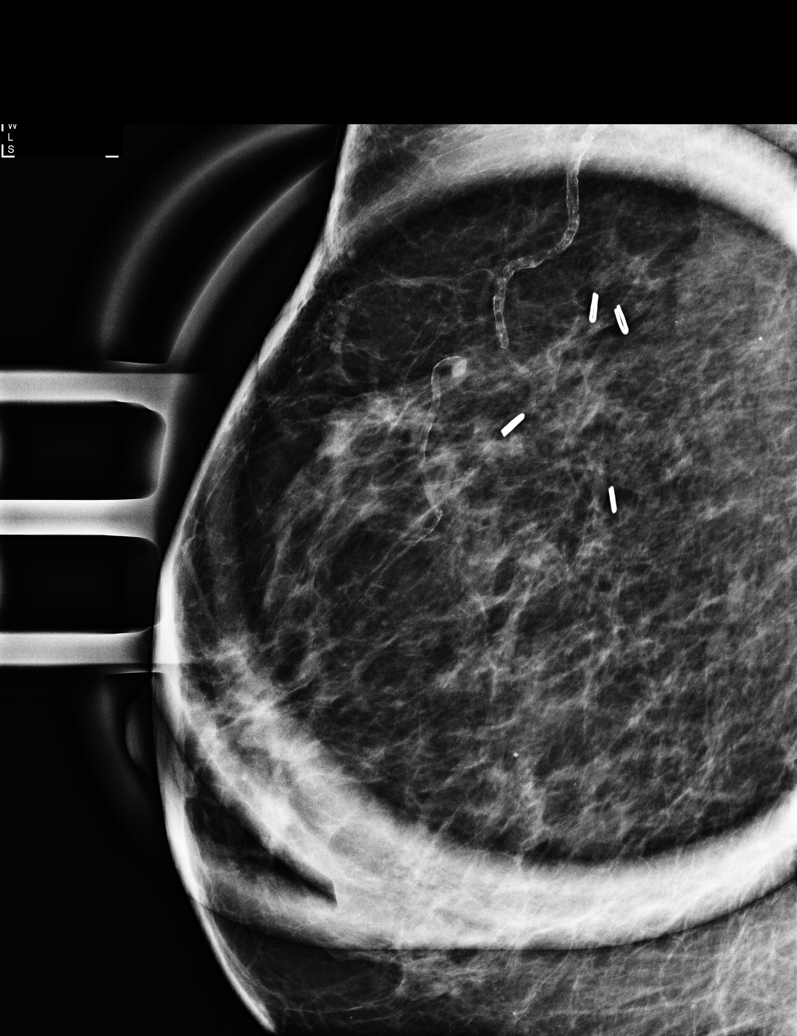

[R MLO synth-2D]
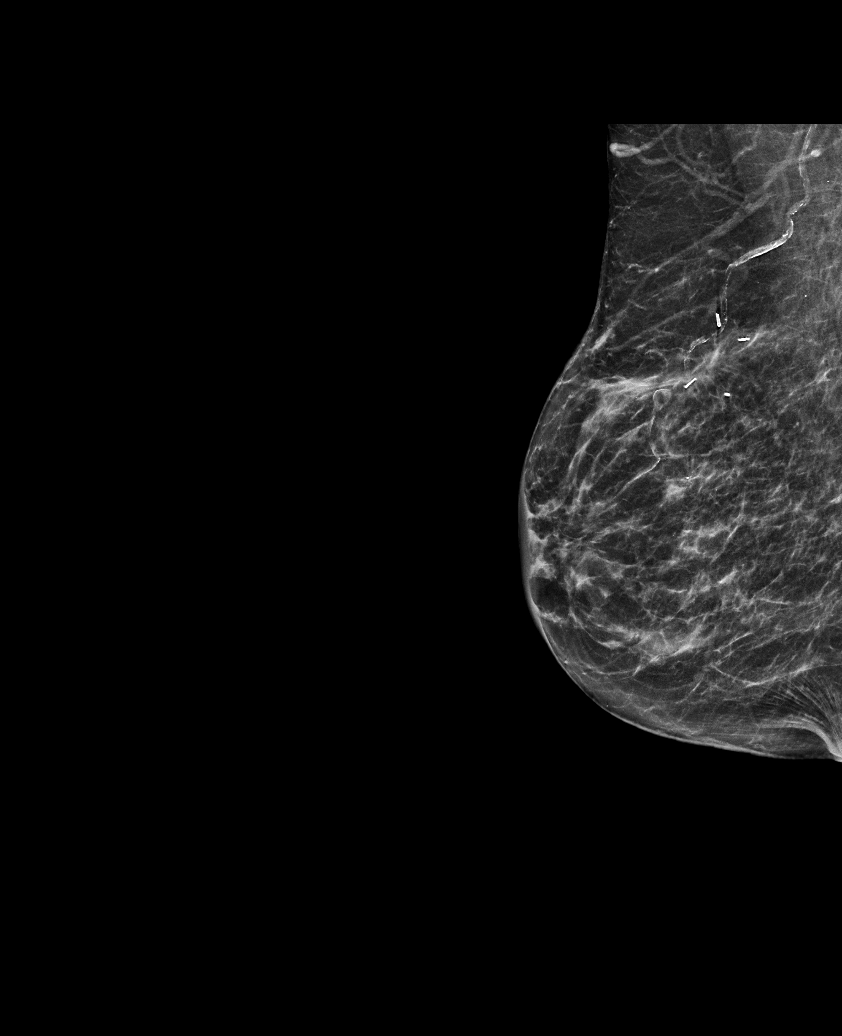

[R CC synth-2D]
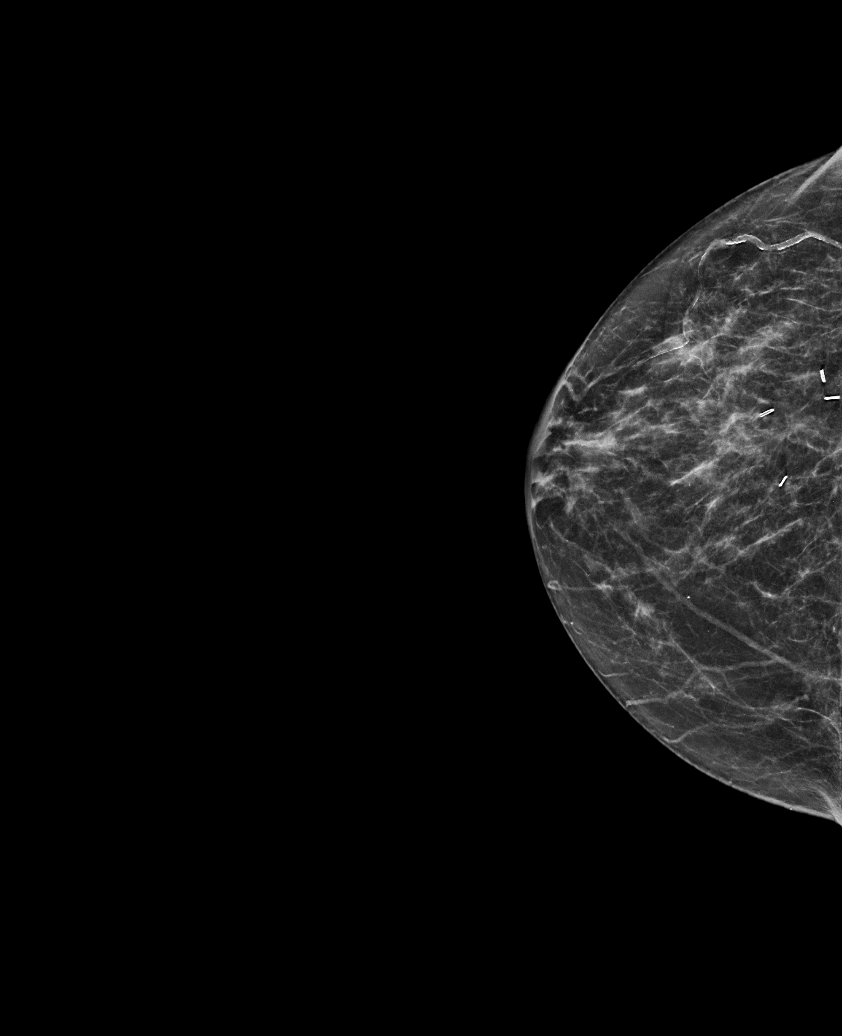

[L CC synth-2D]
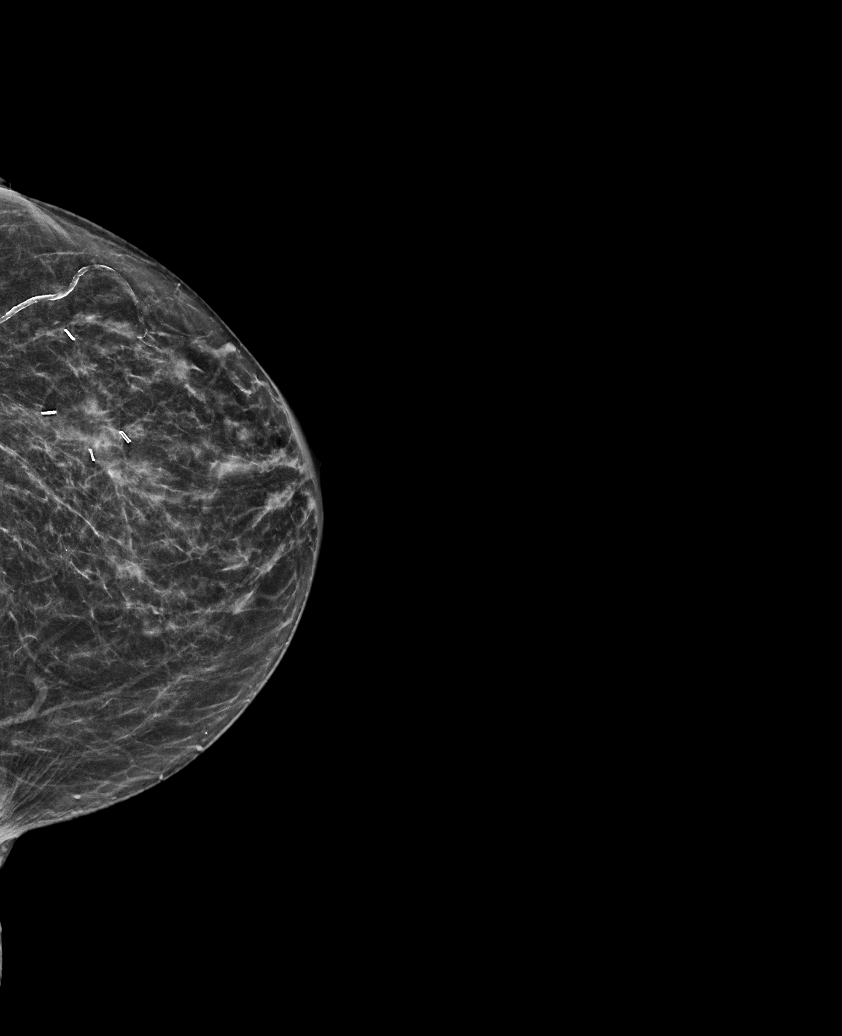

[L MLO synth-2D]
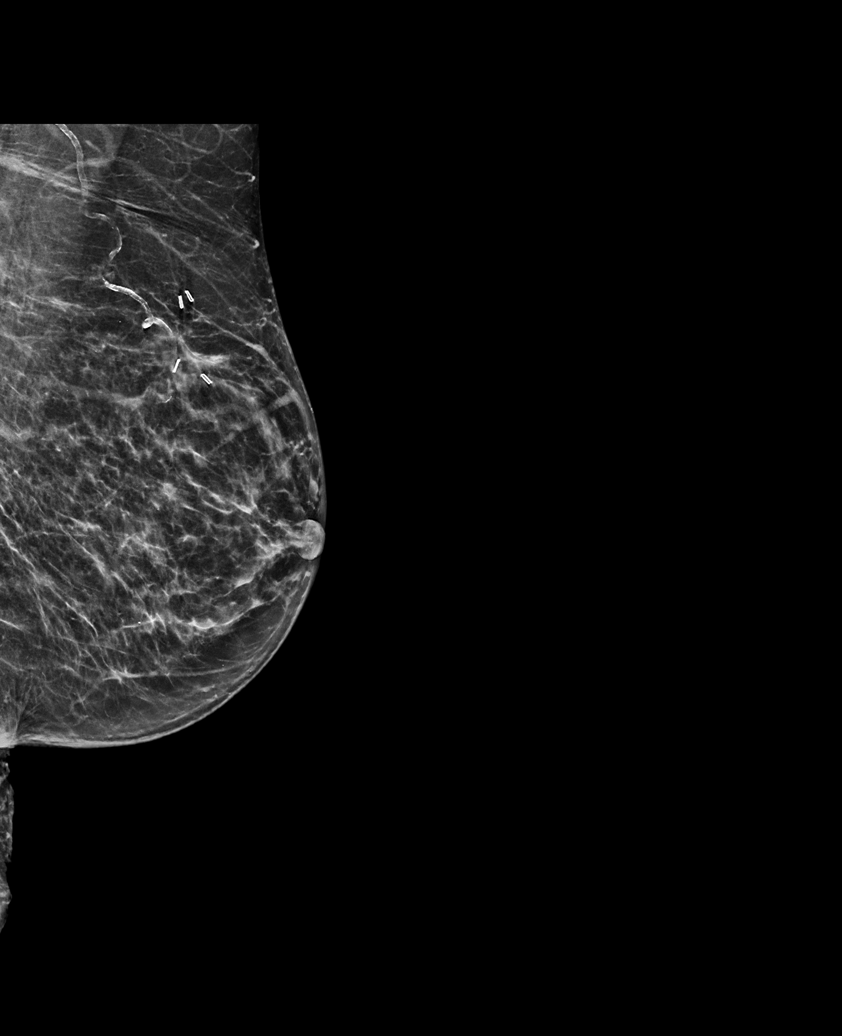

[6 of 26 positions shown; findings below may reference images not displayed]

ACR Breast Density Category b: There are scattered areas of
fibroglandular density.
FINDINGS: Architectural distortion in the outer aspects of each breast
reflects post lumpectomy scarring.

There are no discrete masses or other areas of architectural
distortion. There are no new or suspicious calcifications.

Mammographic images were processed with CAD.
IMPRESSION: 1. No evidence of recurrent or new breast malignancy.
2. Benign bilateral post lumpectomy changes.

RECOMMENDATION:
Diagnostic mammography in 1 year per standard post lumpectomy
protocol.

I have discussed the findings and recommendations with the patient.
Results were also provided in writing at the conclusion of the
visit. If applicable, a reminder letter will be sent to the patient
regarding the next appointment.

BI-RADS CATEGORY  2: Benign.

## 2019-04-12 ENCOUNTER — Other Ambulatory Visit: Payer: Self-pay | Admitting: Cardiology

## 2019-04-29 DIAGNOSIS — I5032 Chronic diastolic (congestive) heart failure: Secondary | ICD-10-CM | POA: Diagnosis not present

## 2019-04-29 DIAGNOSIS — N184 Chronic kidney disease, stage 4 (severe): Secondary | ICD-10-CM | POA: Diagnosis not present

## 2019-04-29 DIAGNOSIS — E1122 Type 2 diabetes mellitus with diabetic chronic kidney disease: Secondary | ICD-10-CM | POA: Diagnosis not present

## 2019-04-29 DIAGNOSIS — I130 Hypertensive heart and chronic kidney disease with heart failure and stage 1 through stage 4 chronic kidney disease, or unspecified chronic kidney disease: Secondary | ICD-10-CM | POA: Diagnosis not present

## 2019-05-07 ENCOUNTER — Other Ambulatory Visit: Payer: Self-pay

## 2019-05-07 ENCOUNTER — Ambulatory Visit
Admission: RE | Admit: 2019-05-07 | Discharge: 2019-05-07 | Disposition: A | Discharge status: Home / Self Care | Payer: Medicare Other | Source: Ambulatory Visit | Attending: Nurse Practitioner | Admitting: Nurse Practitioner

## 2019-05-07 DIAGNOSIS — M8589 Other specified disorders of bone density and structure, multiple sites: Secondary | ICD-10-CM | POA: Diagnosis not present

## 2019-05-07 DIAGNOSIS — Z780 Asymptomatic menopausal state: Secondary | ICD-10-CM | POA: Diagnosis not present

## 2019-05-07 DIAGNOSIS — M8580 Other specified disorders of bone density and structure, unspecified site: Secondary | ICD-10-CM

## 2019-05-07 DIAGNOSIS — E2839 Other primary ovarian failure: Secondary | ICD-10-CM

## 2019-05-16 DIAGNOSIS — N184 Chronic kidney disease, stage 4 (severe): Secondary | ICD-10-CM | POA: Diagnosis not present

## 2019-05-25 DIAGNOSIS — D631 Anemia in chronic kidney disease: Secondary | ICD-10-CM | POA: Diagnosis not present

## 2019-05-25 DIAGNOSIS — N2581 Secondary hyperparathyroidism of renal origin: Secondary | ICD-10-CM | POA: Diagnosis not present

## 2019-05-25 DIAGNOSIS — Z23 Encounter for immunization: Secondary | ICD-10-CM | POA: Diagnosis not present

## 2019-05-25 DIAGNOSIS — N184 Chronic kidney disease, stage 4 (severe): Secondary | ICD-10-CM | POA: Diagnosis not present

## 2019-05-25 DIAGNOSIS — I129 Hypertensive chronic kidney disease with stage 1 through stage 4 chronic kidney disease, or unspecified chronic kidney disease: Secondary | ICD-10-CM | POA: Diagnosis not present

## 2019-05-27 DIAGNOSIS — I5032 Chronic diastolic (congestive) heart failure: Secondary | ICD-10-CM | POA: Diagnosis not present

## 2019-05-27 DIAGNOSIS — I130 Hypertensive heart and chronic kidney disease with heart failure and stage 1 through stage 4 chronic kidney disease, or unspecified chronic kidney disease: Secondary | ICD-10-CM | POA: Diagnosis not present

## 2019-05-27 DIAGNOSIS — N184 Chronic kidney disease, stage 4 (severe): Secondary | ICD-10-CM | POA: Diagnosis not present

## 2019-05-27 DIAGNOSIS — E1122 Type 2 diabetes mellitus with diabetic chronic kidney disease: Secondary | ICD-10-CM | POA: Diagnosis not present

## 2019-05-28 ENCOUNTER — Telehealth: Payer: Self-pay | Admitting: Nurse Practitioner

## 2019-05-28 NOTE — Telephone Encounter (Signed)
I called Ms. Palau to review recent DEXA and recommendation to start bisphosphonate due to high FRAX score. Mailbox full, could not leave a message. I will try to reach her again.   Cira Rue, NP

## 2019-06-07 ENCOUNTER — Other Ambulatory Visit: Payer: Self-pay | Admitting: Neurology

## 2019-06-12 ENCOUNTER — Telehealth: Payer: Self-pay | Admitting: Nurse Practitioner

## 2019-06-12 NOTE — Telephone Encounter (Signed)
Second attempt to reach patient to discuss DEXA and recommendations. No answer. Will try to reach again later. mychart message not available.   Cira Rue, NP

## 2019-06-19 ENCOUNTER — Other Ambulatory Visit: Payer: Self-pay | Admitting: *Deleted

## 2019-06-19 NOTE — Patient Outreach (Signed)
[facility]) Care Management  [place]  06/19/2019   [name] January 22, 1941 [id number]  RN Health Coach telephone call to patient.  Hipaa compliance verified. Per patient she has been monitoring her weight , blood pressure and blood sugars daily. Patient blood pressure is good. Patient has started on insulin and now off the metformin due to stage 4 CKD. Patient does have a little swelling intermittently in lower extremities. Her weight is 192-194 pounds. Patient is taking her diuretic as prescribed. Patient is not having any shortness of breath of coughing episodes. Patient has been adhering to her diet and is trying to learn more information on the renal diet. Patient has had three falls since last outreach. No injuries from the fall. Per patient her feet are turning outwards and this is causing problems with balance. Patient uses a walker to go through house.  Patient and husband stated they look forward to the outreach calls.   Encounter Medications:  Outpatient Encounter Medications as of 06/19/2019  Medication Sig Note  . bumetanide (BUMEX) 2 MG tablet Take 1 tablet (2 mg total) by mouth 2 (two) times daily. (Patient taking differently: Take 2 mg by mouth 2 (two) times daily. 2 tablets in the morning and 1 at noon)   . Calcium Carb-Cholecalciferol (CALCIUM 600+D3) 600-800 MG-UNIT TABS Take 1 tablet by mouth daily.   . clopidogrel (PLAVIX) 75 MG tablet TAKE 1 TABLET DAILY   . COLCRYS 0.6 MG tablet TAKE 1 TABLET DAILY AS NEEDED FOR GOUT FLARE.   Marland Kitchen Cranberry 500 MG TABS Take 500 mg by mouth every evening.   . diazepam (VALIUM) 5 MG tablet Take 1 tablet (5 mg total) by mouth 2 (two) times daily as needed for anxiety.   Marland Kitchen exemestane (AROMASIN) 25 MG tablet TAKE 1 TABLET DAILY AFTER BREAKFAST   . febuxostat (ULORIC) 40 MG tablet Take 1 tablet (40 mg total) by mouth daily.   Marland Kitchen gabapentin (NEURONTIN) 300 MG capsule Take 300-600 mg by mouth daily as needed (nerve  pain).   Marland Kitchen gabapentin (NEURONTIN) 300 MG capsule Take 1 capsule (300 mg total) by mouth 2 (two) times daily. Take 1 during the day if needed and 2 at night   . Insulin Lispro (HUMALOG KWIKPEN [place]) Inject into the skin. 100-150= 2 units, 151-200= 3 units and 201-250= 4 units   . insulin NPH Human (HUMULIN N,NOVOLIN N) 100 UNIT/ML injection Inject 20 Units into the skin See admin instructions. Inject 20 units in the morning & inject 25 units after supper. 02/24/2018: Took 20 units 02/24/18  . insulin NPH-regular Human (70-30) 100 UNIT/ML injection Inject 20 Units into the skin 2 (two) times daily with a meal.   . Krill Oil 500 MG CAPS Take 500 mg by mouth daily.   . Methylfol-Methylcob-Acetylcyst (CEREFOLIN NAC) 6-2-600 MG TABS TAKE 1 TABLET BY MOUTH EVERY DAY   . metolazone (ZAROXOLYN) 2.5 MG tablet TAKE 1 TABLET DAILY 30 MINUTES BEFORE THE MORNING DOSE OF BUMEX AS DIRECTED   . metoprolol succinate (TOPROL XL) 25 MG 24 hr tablet Take 0.5 tablets (12.5 mg total) by mouth at bedtime.   . mirtazapine (REMERON) 15 MG tablet Take 15 mg by mouth at bedtime. Taking 1 1/2 tablet at bedtime   . nitroGLYCERIN (NITROSTAT) 0.4 MG SL tablet Place 0.4 mg under the tongue every 5 (five) minutes as needed for chest pain. 03/23/2018: Has on hand  . oxyCODONE-acetaminophen (PERCOCET/ROXICET) 5-325 MG tablet Take 1 tablet by mouth every  6 (six) hours as needed for severe pain.   . pantoprazole (PROTONIX) 40 MG tablet TAKE 1 TABLET DAILY (CONTACT OFFICE FOR ADDITIONAL REFILLS, FINAL ATTEMPT) (Patient taking differently: Take 40 mg by mouth daily before breakfast. )   . potassium chloride (KLOR-CON) 20 MEQ packet Take 20 mEq by mouth 2 (two) times daily. 2 tablets in the morning and 1 at bedtime   . pramipexole (MIRAPEX) 0.25 MG tablet Take 1-2 tablets (0.25-0.5 mg total) by mouth See admin instructions. TAKE 1 TABLET AT 4:00 P.M. AND 2 TABLETS (0.5 MG) AT BEDTIME   . rosuvastatin (CRESTOR) 5 MG tablet Take 5 mg by mouth  daily.   . sennosides-docusate sodium (SENOKOT-S) 8.6-50 MG tablet Take 1 tablet by mouth daily as needed for constipation.   Marland Kitchen Ubiquinol (QUNOL COQ10/UBIQUINOL/MEGA) 100 MG CAPS Take 100 mg by mouth every evening. CoQ10, Qunol     No facility-administered encounter medications on file as of 06/19/2019.    Functional Status:  In your present state of health, do you have any difficulty performing the following activities: 03/19/2019 01/16/2019  Hearing? N -  Vision? N N  Difficulty concentrating or making decisions? Y Y  Comment - short term memory loss  Walking or climbing stairs? Y Y  Comment - uses a cane at all times  Dressing or bathing? Y Y  Comment - Husband assist with washing the patient hair and bathing  Doing errands, shopping? Y Y  Comment - husband  helps  Conservation officer, nature and eating ? Y Y  Comment - husband prepares food  Using the Toilet? N N  In the past six months, have you accidently leaked urine? N N  Do you have problems with loss of bowel control? N N  Managing your Medications? Y Y  Comment - husband organizes medications  Managing your Finances? Y Y  Comment - husband handles all  Housekeeping or managing your Housekeeping? Y Y  Comment - husband handles  Some recent data might be hidden    Fall/Depression Screening: Fall Risk  06/19/2019 03/19/2019 01/16/2019  Falls in the past year? - 1 1  Number falls in past yr: '1 1 1  '$ Comment 3 falls since last outreach with no injury - -  Injury with Fall? 0 0 1  Risk for fall due to : History of fall(s);Impaired balance/gait;Impaired mobility History of fall(s);Impaired balance/gait;Impaired mobility History of fall(s);Impaired balance/gait;Impaired mobility  Risk for fall due to: Comment ortho condition is causing feet to unbalanced - -  Follow up Falls evaluation completed;Falls prevention discussed;Education provided Falls evaluation completed;Falls prevention discussed;Education provided Falls evaluation  completed;Education provided;Falls prevention discussed   PHQ 2/9 Scores 06/19/2019 03/19/2019 01/16/2019 10/25/2018 07/24/2018 03/31/2018 08/23/2017  PHQ - 2 Score 0 0 0 0 0 0 1   THN CM Care Plan Problem One     Most Recent Value  Care Plan Problem One  Knowledge Deficit in Self Management of Congestive Heart Failure  Role Documenting the Problem One  [place] for Problem One  Active  THN Long Term Goal   Patient will follow up with health maintenance within the next 90 days  THN Long Term Goal Start Date  06/19/19  Interventions for Problem One Long Term Goal  RN discussed Health Maintenance and the need to get eye exam up to date. Patient has taken her 1st COVID vaccine. RN will follow up for further discussion on flu, pneumonia and shingles vaccine.  THN CM Short Term Goal #  1   Patient and husband will have a better understanding about incorporating the renal diet into their low sodiun and diabetic diet within the next 30 days.  THN CM Short Term Goal #1 Start Date  06/19/19  [facility] CM Short Term Goal #1 Met Date  06/19/19  Interventions for Short Term Goal #1  RN discussed renal diet because patient is stage 4 CKD. RN sent educational material on diet. RN will follow up for further discussion  THN CM Short Term Goal #3  Patient and husband will verbalize understanding foods high and low in sodium within the next 30 days  THN CM Short Term Goal #3 Met Date  06/19/19  [facility] CM Short Term Goal #4  Patient will verbalize documenting blood pressure, weights and blood sugars within the next 30 days  THN CM Short Term Goal #4 Met Date  06/19/19      Assessment:  Patient has CKD stage 4 Patient is wanting educational material on diet for CKD Patient is taking medications as prescribed Patient has been monitoring her wt,bp and blood sugars Patient has had 3 falls since last outreach no injuries Patient is wanting to try the Free style [place] 2  Plan:  RN sent educational material on  Kidney disease diet RN sent educational material on Chronic kidney disease RN discussed COVID vaccine RN discussed Health maintenance Patient will follow up on scheduling eye exam Patient will discuss with Dr Criss Rosales about the free style Elenor Legato RN will follow up outreach within the month of [place] Management [phone]

## 2019-06-21 ENCOUNTER — Telehealth: Payer: Self-pay | Admitting: Nurse Practitioner

## 2019-06-21 NOTE — Telephone Encounter (Signed)
Third attempt to reach patient to discuss DEXA and possibly initiating bisphosphonate. Left generic message with [place] number to return call. Will continue trying to reach patient.   Cira Rue, NP

## 2019-06-22 DIAGNOSIS — Z23 Encounter for immunization: Secondary | ICD-10-CM | POA: Diagnosis not present

## 2019-06-27 DIAGNOSIS — I130 Hypertensive heart and chronic kidney disease with heart failure and stage 1 through stage 4 chronic kidney disease, or unspecified chronic kidney disease: Secondary | ICD-10-CM | POA: Diagnosis not present

## 2019-06-27 DIAGNOSIS — N184 Chronic kidney disease, stage 4 (severe): Secondary | ICD-10-CM | POA: Diagnosis not present

## 2019-06-27 DIAGNOSIS — I5032 Chronic diastolic (congestive) heart failure: Secondary | ICD-10-CM | POA: Diagnosis not present

## 2019-06-27 DIAGNOSIS — E1122 Type 2 diabetes mellitus with diabetic chronic kidney disease: Secondary | ICD-10-CM | POA: Diagnosis not present

## 2019-07-03 ENCOUNTER — Telehealth: Payer: Self-pay | Admitting: Nurse Practitioner

## 2019-07-03 NOTE — Telephone Encounter (Signed)
Another unsuccessful attempt to reach patient regarding DEXA results and recommendations. At this point I will schedule a visit in hopes she will come in to discuss this.   Cira Rue, NP

## 2019-07-04 ENCOUNTER — Telehealth: Payer: Self-pay | Admitting: Nurse Practitioner

## 2019-07-04 NOTE — Telephone Encounter (Signed)
Scheduled appt per 4/6 sch message - pt is aware of appt

## 2019-07-15 DIAGNOSIS — I110 Hypertensive heart disease with heart failure: Secondary | ICD-10-CM | POA: Diagnosis not present

## 2019-07-15 DIAGNOSIS — R519 Headache, unspecified: Secondary | ICD-10-CM | POA: Diagnosis not present

## 2019-07-15 DIAGNOSIS — R040 Epistaxis: Secondary | ICD-10-CM | POA: Diagnosis not present

## 2019-07-15 DIAGNOSIS — I509 Heart failure, unspecified: Secondary | ICD-10-CM | POA: Diagnosis not present

## 2019-07-15 DIAGNOSIS — I10 Essential (primary) hypertension: Secondary | ICD-10-CM | POA: Diagnosis not present

## 2019-07-17 ENCOUNTER — Inpatient Hospital Stay: Payer: Medicare Other | Attending: Nurse Practitioner | Admitting: Nurse Practitioner

## 2019-07-17 ENCOUNTER — Encounter: Payer: Self-pay | Admitting: Nurse Practitioner

## 2019-07-17 ENCOUNTER — Telehealth: Payer: Self-pay | Admitting: *Deleted

## 2019-07-17 DIAGNOSIS — M8580 Other specified disorders of bone density and structure, unspecified site: Secondary | ICD-10-CM | POA: Diagnosis not present

## 2019-07-17 DIAGNOSIS — C50211 Malignant neoplasm of upper-inner quadrant of right female breast: Secondary | ICD-10-CM

## 2019-07-17 DIAGNOSIS — C50412 Malignant neoplasm of upper-outer quadrant of left female breast: Secondary | ICD-10-CM

## 2019-07-17 DIAGNOSIS — Z170 Estrogen receptor positive status [ER+]: Secondary | ICD-10-CM

## 2019-07-17 NOTE — Telephone Encounter (Signed)
Received vm call from pt stating that she will cancel appt today d/t having a cold & thinks it is a risk to come in.  She states she is OK to r/s.  Message to Cira Rue NP. Called pt & she states she is at home & can be reached on mobile or home # if Lacie needs to call her today.

## 2019-07-17 NOTE — Progress Notes (Signed)
[facility]   [phone] [phone]) [phone]   Clinic Follow up Note   Patient Care Team: Redmond School, MD as PCP - General (Internal Medicine) Leonie Man, MD as PCP - Cardiology (Cardiology) Elmarie Shiley, MD as Consulting Physician (Nephrology) Leonie Man, MD as Consulting Physician (Cardiology) Alphonsa Overall, MD as Consulting Physician (General Surgery) Kyung Rudd, MD as Consulting Physician (Radiation Oncology) Truitt Merle, MD as Consulting Physician (Hematology) Delice Bison, Charlestine Massed, NP as Nurse Practitioner (Hematology and Oncology) Pleasant, Eppie Gibson, RN as [place] Management 07/17/2019  CHIEF COMPLAINT: F/u to discuss DEXA result   I connected with [name] on 07/17/19 at 11:37 AM EDT by telephone visit and verified that I am speaking with the correct person using two identifiers.   I discussed the limitations, risks, security and privacy concerns of performing an evaluation and management service by telemedicine and the availability of in-person appointments. I also discussed with the patient that there may be a patient responsible charge related to this service. The patient expressed understanding and agreed to proceed.   Other persons participating in the visit and their role in the encounter: Spouse   Patient's location: Home Provider's location: [place] office     SUMMARY OF ONCOLOGIC HISTORY: Oncology History Overview Note  Cancer Staging Malignant 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, estrogen receptor positive ([place]) Staging form: Breast, AJCC 8th Edition - Clinical: Stage IA (cT1c, cN0, cM0, G1, ER: Positive, PR: Positive, HER2: Negative) - Unsigned     Malignant 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, estrogen receptor positive ([place])  10/20/2016 Initial Biopsy   Diagnosis Breast, right, needle core biopsy, 11:30 o'clock - INVASIVE DUCTAL CARCINOMA,    10/20/2016 Initial Diagnosis   Malignant 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, estrogen receptor positive ([place])   10/20/2016 Receptors her2   Estrogen Receptor: 100%, POSITIVE, STRONG STAINING INTENSITY Progesterone Receptor: 100%, POSITIVE, STRONG STAINING INTENSITY Proliferation Marker Ki67: 5% HER2 NEGATIVE    10/20/2016 Mammogram   Diagnostic mammogram showed persistent distortion corresponds to a vaguely palpable irregular mass and is suspicious for malignancy.Targeted ultrasound is performed, showing an irregular hypoechoic mass with posterior acoustic shadowing in the 11:30 o'clock location of the right breast 4 cm from the nipple which measures 0.9 x 0.9 x0.8 cm. There is associated internal vascularity. Evaluation of the axilla is negative for adenopathy.   11/01/2016 Imaging   B/l BREAST MRI: IMPRESSION: 1. There is a 1.5 cm biopsy proven malignancy in the upper slightly outer quadrant of the right breast. No additional sites of disease are identified.  2. There is an indeterminate 6 mm enhancing mass in the upper-outer left breast.   11/11/2016 Pathology Results   Diagnosis Breast, left, needle core biopsy, upper outer - INVASIVE DUCTAL CARCINOMA. - ATYPICAL LOBULAR HYPERPLASIA. - FIBROCYSTIC CHANGES AND PSEUDOANGIOMATOUS STROMAL HYPERPLASIA ([place]).   11/11/2016 Receptors her2    Estrogen Receptor: 100%, POSITIVE, STRONG STAINING INTENSITY Progesterone Receptor: 10%, POSITIVE, WEAK STAINING INTENSITY Proliferation Marker Ki67: 2% HER2 NEGATIVE    12/03/2016 Genetic Testing   Germline genetic testing was performed through [employer]'s Common Hereditary Cancers Panel + [facility]'s Melanoma Panel. This custom panel includes analysis of the following 51 genes: APC, ATM, AXIN2, BAP1, BARD1, BMPR1A, BRCA1, BRCA2, BRIP1, CDH1, CDK4, CDKN2A, CHEK2, CTNNA1, DICER1, EPCAM, GREM1, HOXB13, KIT, MEN1, MITF, MLH1, MSH2, MSH3, MSH6, MUTYH, NBN, NF1, NTHL1, PALB2,  PDGFRA, PMS2, POLD1, POLE, POT1, PTEN, RAD50, RAD51C,  RAD51D, RB1, SDHA, SDHB, SDHC, SDHD, SMAD4, SMARCA4, STK11, TP53, TSC1, TSC2, and VHL.  A variant of uncertain significance (VUS) was noted in BARD1.    12/14/2016 Miscellaneous   Oncotype Recurrence score of 16 10-year risk of distant recurrence with Tamoxifen alone is 10%   12/14/2016 Surgery   BILATERAL BREAST LUMPECTOMY WITH BILATERAL  RADIOACTIVE SEED AND BILATERAL AXILLARY  SENTINEL LYMPH NODE BIOPSY ERAS PATHWAY by Dr. Lucia Gaskins on 12/14/16   12/14/2016 Pathology Results   Diagnosis 12/14/16 1. Breast, lumpectomy, Left w/seed - INVASIVE DUCTAL CARCINOMA, 1 CM. - ATYPICAL LOBULAR HYPERPLASIA ([place]). - PREVIOUS BIOPSY SITE. - INVASIVE CARCINOMA 0.2 CM FROM LATERAL MARGIN. - ATYPICA LOBULAR HYPERPLASIA LESS THAN 0.1 CM FROM LATERAL MARGIN. 2. Breast, lumpectomy, Right w/seed - INVASIVE DUCTAL CARCINOMA, 1.1 CM. - FIBROCYSTIC CHANGES. - PREVIOUS BIOPSY SITE. - INVASIVE CARCINOMA 1.1 CM FROM POSTERIOR MARGIN. 3. Lymph node, sentinel, biopsy, Right Axillary Contents - BENIGN FIBROADIPOSE TISSUE. - NO LYMPH NODE TISSUE OR MALIGNANCY. 4. Lymph node, sentinel, biopsy, Left Axillary - ONE BENIGN LYMPH NODE (0/1).    01/17/2017 - 02/11/2017 Radiation Therapy   1. The Left breast was treated to 42.5 Gy in 17 fractions of 2.5 Gy.  2. The Right breast was treated to 42.5 Gy in 17 fractions of 2.5 Gy.  3. The Right breast was boosted to 7.5 Gy in 3 fractions of 2.5 Gy.  4. The Left breast was boosted to 7.5 Gy in 3 fractions of 2.5 Gy.       02/2017 -  Anti-estrogen oral therapy   Exemestane daily     CURRENT THERAPY: Exemestane once daily, started 02/2017   INTERVAL HISTORY: [name] presents by phone today. She had an office visit scheduled but changed to virtual at the last minute because she is dealing with nosebleeds for 3 days starting when she went to visit her sister near [place]. She is otherwise doing fine. She continues  exemestane. Has no side effects that she can tell. Denies hot flash, bone pain. She does self breast exam, no new concerns. Appetite is normal for her. Energy level is at baseline. Her mobility remains limited due to her DM and feet being "turned out." She just completed therapy for that. BG ranges 140-150's. She notes insulin makes it uncontrolled ranging 80->200. [name] took her off calcium due to her renal function. She is on 2000IUs of D3. Otherwise, she denies fever, chills, cough, chest pain, dyspnea, worsening edema, other bleeding, new pain, or change in her bowel habits.    MEDICAL HISTORY:  Past Medical History:  Diagnosis Date  . Anemia    have received iron infusions  . Anxiety   . Arthritis     osteoarthritis  . Breast cancer in female [facility])    Bilateral  . CAD S/P percutaneous coronary angioplasty 2007; March 2011   [employer] 3.0 mm 20 mm postdilated 3.6 mm in early mid LAD; status post ISR Cutting Balloon PTCA and March 11 along with PCI of distal mid lesion with a 3.0 mm 12 mm MultiLink vision BMS; the proximal stent causes jailing of SP1 and SP2 with ostial 70-80% lesions  . Cancer (HCC)    Melanoma, Squamous cell Carcinoma  . Chronic diastolic CHF (congestive heart failure), NYHA class 2 ([place]) 02/28/2018  . Chronic kidney disease    stage 2   . Dementia ([place])    mild  . Diabetes mellitus type 2 with neurological manifestations ([place])   . Dyslipidemia, goal LDL  below 70   . Full dentures   . Genetic testing 12/03/2016   Germline genetic testing was performed through [employer]'s Common Hereditary Cancers Panel + [facility]'s Melanoma Panel. This custom panel includes analysis of the following 51 genes: APC, ATM, AXIN2, BAP1, BARD1, BMPR1A, BRCA1, BRCA2, BRIP1, CDH1, CDK4, CDKN2A, CHEK2, CTNNA1, DICER1, EPCAM, GREM1, HOXB13, KIT, MEN1, MITF, MLH1, MSH2, MSH3, MSH6, MUTYH, NBN, NF1, NTHL1, PALB2, PDGFRA, PMS2, POLD1, POL  . GERD (gastroesophageal reflux disease)   .  Headache(784.0)   . History of hematuria    Followed by Dr. Gaynelle Arabian  . Hypertension, essential, benign   . Peripheral neuropathy   . Personal history of radiation therapy   . Stroke ([place])   . TIA (transient ischemic attack)    multiple in the past  . Wears glasses     SURGICAL HISTORY: Past Surgical History:  Procedure Laterality Date  . ABDOMINAL HYSTERECTOMY    . ANKLE FUSION Right 02/22/2018   Procedure: RIGHT SUBTALAR AND TALONAVICULAR FUSION;  Surgeon: Newt Minion, MD;  Location: [facility];  Service: Orthopedics;  Laterality: Right;  . APPENDECTOMY    . BLADDER SUSPENSION    . BREAST LUMPECTOMY Bilateral 2018  . BREAST LUMPECTOMY WITH RADIOACTIVE SEED AND SENTINEL LYMPH NODE BIOPSY Bilateral 12/14/2016   Procedure: BILATERAL BREAST LUMPECTOMY WITH BILATERAL  RADIOACTIVE SEED AND BILATERAL AXILLARY  SENTINEL LYMPH NODE BIOPSY ERAS PATHWAY;  Surgeon: Alphonsa Overall, MD;  Location: [facility];  Service: General;  Laterality: Bilateral;  PECTORAL BLOCK  . CARDIAC CATHETERIZATION  02/24/2006   85% stenosis in the proximal portion of LAD-arrangements made for PCI on 02/25/2006  . CARDIAC CATHETERIZATION  02/25/2006   80% LAD lesion stented with a 3x50m Liberte stent resulting in reduction of 80% lesion to 0% residual  . CARDIAC CATHETERIZATION  04/26/2006   Medical management  . CARDIAC CATHETERIZATION  06/24/2009   60-70% re-stenosis in the proximal LAD. A 3.25x15 cutting balloon, 3 inflations - 14atm-38sec, 13atm-39sec, and 12atm-40sec reduced to less than 10%. 60% stenosis of the mid/distal LAD stented with a 3x123mMultilink stent.  . Marland KitchenARDIAC CATHETERIZATION  07/09/2009   Medical management  . CAROTID DOPPLER  06/24/2009   40-59% R ICA stenosis. No significant ICA stenosis noted  . CHOLECYSTECTOMY    . DILATION AND CURETTAGE OF UTERUS    . JOINT REPLACEMENT Left   . LESION REMOVAL Right 03/11/2015   Procedure:  EXCISION OF RIGHT PRE TIBIAL LESION;  Surgeon: JaJudeth HornMD;   [facility] Service: General;  Laterality: Right;  . MASS EXCISION Left 06/10/2014   Procedure: EXCISION LEFT LOWER LEG LESION;  Surgeon: JaDoreen SalvageMD;  [facility] Service: General;  Laterality: Left;  . Marland KitchenASS EXCISION Left 09/05/2015   Procedure: EXCISION OF LEFT FOREARM  MASS;  Surgeon: JaJudeth HornMD;  [facility] Service: General;  Laterality: Left;  . NM MYOVIEW LTD  11/16/2011   5 beats a PVC during recovery.  No skin or infarction.  Reached 5 METs, EKG negative for ischemia   . NM MYOVIEW LTD  10/2011   No ischemia or infarction.  Normal EF.  LOW RISK. (Short bursts of 5 beats NSVT during recovery otherwise normal)  . SHOULDER ARTHROSCOPY  10/15  . SHOULDER ARTHROSCOPY  1995   left  . TEE WITHOUT CARDIOVERSION N/A 08/03/2017   Procedure: TRANSESOPHAGEAL ECHOCARDIOGRAM (TEE);  Surgeon: RaSkeet LatchMD;  [facility] Service: Cardiovascular;; EF  60-65% with no wall motion normalities.  No atrial thrombus noted.  Lightly findings consistent with a lipomatous hypertrophy of the atrial septum.   . TENDON REPAIR Left 05/12/2016   Procedure: Left Anterior Tibial Tendon Reconstruction;  Surgeon: Newt Minion, MD;  Location: [facility];  Service: Orthopedics;  Laterality: Left;  . TOTAL KNEE ARTHROPLASTY  2005   left  . TRANSESOPHAGEAL ECHOCARDIOGRAM  08/03/2016   : EF 60-65% no wall motion normalities.  No atrial thrombus.  Lipomatous hypertrophy of the atrial septum.  No mass noted.  . TRANSTHORACIC ECHOCARDIOGRAM  10/2015   EF 55-60 %. Mild LVH. Mild pulmonary hypertension. Normal valves.  . TRANSTHORACIC ECHOCARDIOGRAM  07/2017   Vigorous LV function.  EF 65-70%.  Mild diastolic dysfunction with no RWMA. GR 1 DD.  Mild aortic sclerosis/no stenosis.  Questionable right atrial mass discounted with TEE)   . WRIST ARTHROPLASTY  2010   cancer lt wrist    I have reviewed the social history and family  history with the patient and they are unchanged from previous note.  ALLERGIES:  is allergic to ultram [tramadol]; lipitor [atorvastatin]; lyrica [pregabalin]; reglan [metoclopramide]; and nsaids.  MEDICATIONS:  Current Outpatient Medications  Medication Sig Dispense Refill  . bumetanide (BUMEX) 2 MG tablet Take 1 tablet (2 mg total) by mouth 2 (two) times daily. (Patient taking differently: Take 2 mg by mouth 2 (two) times daily. 2 tablets in the morning and 1 at noon) 60 tablet 0  . Calcium Carb-Cholecalciferol (CALCIUM 600+D3) 600-800 MG-UNIT TABS Take 1 tablet by mouth daily.    . clopidogrel (PLAVIX) 75 MG tablet TAKE 1 TABLET DAILY 90 tablet 3  . COLCRYS 0.6 MG tablet TAKE 1 TABLET DAILY AS NEEDED FOR GOUT FLARE. 90 tablet 3  . Cranberry 500 MG TABS Take 500 mg by mouth every evening.    . diazepam (VALIUM) 5 MG tablet Take 1 tablet (5 mg total) by mouth 2 (two) times daily as needed for anxiety. 10 tablet 0  . exemestane (AROMASIN) 25 MG tablet TAKE 1 TABLET DAILY AFTER BREAKFAST 90 tablet 3  . febuxostat (ULORIC) 40 MG tablet Take 1 tablet (40 mg total) by mouth daily. 90 tablet 3  . gabapentin (NEURONTIN) 300 MG capsule Take 300-600 mg by mouth daily as needed (nerve pain).    Marland Kitchen gabapentin (NEURONTIN) 300 MG capsule Take 1 capsule (300 mg total) by mouth 2 (two) times daily. Take 1 during the day if needed and 2 at night 270 capsule 1  . Insulin Lispro (HUMALOG KWIKPEN [place]) Inject into the skin. 100-150= 2 units, 151-200= 3 units and 201-250= 4 units    . insulin NPH Human (HUMULIN N,NOVOLIN N) 100 UNIT/ML injection Inject 20 Units into the skin See admin instructions. Inject 20 units in the morning & inject 25 units after supper.    . insulin NPH-regular Human (70-30) 100 UNIT/ML injection Inject 20 Units into the skin 2 (two) times daily with a meal.    . Krill Oil 500 MG CAPS Take 500 mg by mouth daily.    . Methylfol-Methylcob-Acetylcyst (CEREFOLIN NAC) 6-2-600 MG TABS TAKE 1 TABLET  BY MOUTH EVERY DAY 90 tablet 3  . metolazone (ZAROXOLYN) 2.5 MG tablet TAKE 1 TABLET DAILY 30 MINUTES BEFORE THE MORNING DOSE OF BUMEX AS DIRECTED 90 tablet 1  . metoprolol succinate (TOPROL XL) 25 MG 24 hr tablet Take 0.5 tablets (12.5 mg total) by mouth at bedtime. 45 tablet 3  . mirtazapine (REMERON) 15 MG  tablet Take 15 mg by mouth at bedtime. Taking 1 1/2 tablet at bedtime    . nitroGLYCERIN (NITROSTAT) 0.4 MG SL tablet Place 0.4 mg under the tongue every 5 (five) minutes as needed for chest pain.    Marland Kitchen oxyCODONE-acetaminophen (PERCOCET/ROXICET) 5-325 MG tablet Take 1 tablet by mouth every 6 (six) hours as needed for severe pain. 28 tablet 0  . pantoprazole (PROTONIX) 40 MG tablet TAKE 1 TABLET DAILY (CONTACT OFFICE FOR ADDITIONAL REFILLS, FINAL ATTEMPT) (Patient taking differently: Take 40 mg by mouth daily before breakfast. ) 90 tablet 0  . potassium chloride (KLOR-CON) 20 MEQ packet Take 20 mEq by mouth 2 (two) times daily. 2 tablets in the morning and 1 at bedtime    . pramipexole (MIRAPEX) 0.25 MG tablet Take 1-2 tablets (0.25-0.5 mg total) by mouth See admin instructions. TAKE 1 TABLET AT 4:00 P.M. AND 2 TABLETS (0.5 MG) AT BEDTIME 270 tablet 1  . rosuvastatin (CRESTOR) 5 MG tablet Take 5 mg by mouth daily.    . sennosides-docusate sodium (SENOKOT-S) 8.6-50 MG tablet Take 1 tablet by mouth daily as needed for constipation.    Marland Kitchen Ubiquinol (QUNOL COQ10/UBIQUINOL/MEGA) 100 MG CAPS Take 100 mg by mouth every evening. CoQ10, Qunol      No current facility-administered medications for this visit.    PHYSICAL EXAMINATION:  There were no vitals filed for this visit. There were no vitals filed for this visit.  Patient appears well over the phone. Speech is clear and intact. Oriented x3. No cough or conversational dyspnea  LABORATORY DATA:  No lab data for this visit    RADIOGRAPHIC STUDIES: I have personally reviewed the radiological images as listed and agreed with the findings in the  report. No results found.   ASSESSMENT & PLAN: [name] is a 79 y.o. 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 She is s/p b/l breast lumpectomy and has adjuvant radiation.Her RS was 16 and no benefit from chemotherapy so not recommended.  -She started antiestrogen therapy with Exemestane in 02/2017. -mammogram in 09/2018 was negative, she remains on surveillance -[name] is clinically doing well. She denies new concerns in her breasts on self exam. No clinical concern for recurrence.  -She continues AI which she tolerates well without side effects. Continue -she is due annual mammogram this summer, scheduled on 10/29/19 -she will return in 2 months for in-person exam and follow up  2. Nosebleeding  -onset 3 days ago -denies sinus/allergy symptoms, etiology unclear -has appt with PCP pending, and likely ENT   3. Osteopenia  -2016 DEXA shows right femur with osteopenia. DEXA from 02/24/17 results with a T score of -1.6 -[name] took her off calcium, she continues 2000 IU vit D3 -repeat DEXA on 05/07/19 shows osteopenia at left femur neck with T score -2.3. The BMD of total hip and lumbar spine shows statistically significant decrease.  -FRAX score shows 5.6 % hip fracture risk, and 16.8% risk of major osteoporotic fracture.  -AI is likely contributing. She would benefit from bisphosphonate. I reviewed rationale and potential side effects, she is interested. -Given her CKD, I will check with [name] to see if she is a candidate. I do not have recent renal function studies.  -if not a candidate for bisphosphonate, may consider changing her anti-estrogen to tamoxifen  -I will f/u  on the recommendations.   4. Genetics Negative for pathogenetic mutations.   5. HTN, DM, CAD and CHF with LE edema   -continue f/u withPCP, nephrologist [name], and her cardiologist [name]  6. CKD, Stage IV -She will continue to follow up with [name]  7. Arthritis,diabetic foot, balance issue  -Probably related to her DM -S/p right foot surgery in 01/2018. She had sepsis afterward, has resolved/recovered.  -completed PT for balance issue recently   PLAN: -Reviewed DEXA, I recommend bisphosphonate for osteopenia and high FRAX score -Will check with [name] re: bisphosphonate, I will call patient with final recommendations  -Continue exemestane for now -Lab, f/u 08/2019 in person  -Mammogram 10/29/19  No problem-specific Assessment & Plan notes found for this encounter.   No orders of the defined types were placed in this encounter.  All questions were answered. The patient knows to call the clinic with any problems, questions or concerns. No barriers to learning were detected. I spent 15 minutes non-face-to-face time in today's encounter. She knows to call or seek in person evaluation if she has worsening or persistent symptoms.      Alla Feeling, NP 07/17/19

## 2019-07-17 NOTE — Telephone Encounter (Signed)
No problem, i'll give her a call.  Thanks, LB

## 2019-07-23 ENCOUNTER — Telehealth: Payer: Self-pay | Admitting: Cardiology

## 2019-07-23 NOTE — Telephone Encounter (Signed)
  Husband is calling because patient has had a nose bleed everyday for the past 9 days, started on 07/15/19. It happens randomly throughout the day from her left nostril  It is usually accompanied by a headache. He states that it will soak 2-3 wash clots and occasionally clots come out. Sometimes she will bleed for a couple hours at a time. Patient takes clopidogrel (PLAVIX) 75 MG tablet and her husband wants to know if she should stop taking this medication temporarily until the nose bleeds stop.

## 2019-07-23 NOTE — Telephone Encounter (Signed)
Called and notified pt regarding pharmacists recommendations. Pt reports that she has bled for 9 days and at least 2-3 times a day and the blood will just pour out and soak towels. She reports that today she has not had a nose bleed since 6 am, but when she does have them it is only out of her L nostril. She reports that she went to the ED in [place] a week ago because she was staying with her terminally ill sister there and that they had done a CT and this was fine. She states that they also recommended her try the saline nasal spray as well as Vaseline. Pt states that she has held her plavix for 2 days already and she will hold for another 3. Notified to continue with the saline nasal spray 3-4 time a day to keep her sinuses moist and that we also recommend her use Afrin nasal spray twice daily for 2 days to help stop the bleeding as well. Advised her to avoid blowing her nose and if she continues to still have bleeding she needs to contact her PCP or an UC to get it cauterized. Pt verbalized understanding with all instructions and was very thankful for the call. No other questions at this time. Will route to Dr.Harding to make aware.

## 2019-07-23 NOTE — Telephone Encounter (Signed)
Ok to hold clopidogrel for 5 days.  Would advise that she get nasal saline to use 3-4 times daily to keep sinuses moist.  Also should use Afrin nasal spray twice daily for 2 days to help stop the bleeding.  She should avoid blowing her nose and if the bleeding continues should go to PCP or UC to get cauterized.

## 2019-07-23 NOTE — Telephone Encounter (Signed)
I concur with Erasmo Downer.  What I would say though is less just hold Plavix until all this bleeding issue stops.  Even if it is more than 5 days.  After 5 days, the effect of Plavix is no longer an issue -> if we need to hold for a month we can.  She may simply be declaring herself as somebody that we just cannot keep on Plavix.  Glenetta Hew, MD

## 2019-07-23 NOTE — Telephone Encounter (Signed)
We cannot make recommendations on holding antiplatelets this needs to go to MD.

## 2019-07-27 ENCOUNTER — Telehealth: Payer: Self-pay | Admitting: *Deleted

## 2019-07-27 ENCOUNTER — Telehealth: Payer: Self-pay | Admitting: Orthopedic Surgery

## 2019-07-27 DIAGNOSIS — E1122 Type 2 diabetes mellitus with diabetic chronic kidney disease: Secondary | ICD-10-CM | POA: Diagnosis not present

## 2019-07-27 DIAGNOSIS — N184 Chronic kidney disease, stage 4 (severe): Secondary | ICD-10-CM | POA: Diagnosis not present

## 2019-07-27 DIAGNOSIS — I130 Hypertensive heart and chronic kidney disease with heart failure and stage 1 through stage 4 chronic kidney disease, or unspecified chronic kidney disease: Secondary | ICD-10-CM | POA: Diagnosis not present

## 2019-07-27 DIAGNOSIS — I5032 Chronic diastolic (congestive) heart failure: Secondary | ICD-10-CM | POA: Diagnosis not present

## 2019-07-27 NOTE — Telephone Encounter (Signed)
Husband called to find out what recommendations [name] has after talking with Dr Posey Pronto regarding Osteopenia. States she is anxious to hear.

## 2019-07-27 NOTE — Telephone Encounter (Signed)
Pt called in stating she had surgery on her right foot in 2019 and recently she's been having pain in her right foot and she wanted to know if she had to make an appt since her last visit was in may of 2020 or if she could just be prescribed the 5325 oxycodone/tylenol medication she was taking before. Pt would like a call back with this answer.   Cell# [phone]  Home# [phone]

## 2019-07-27 NOTE — Telephone Encounter (Signed)
Can you please call the pt and make an appt it has been a year since she has been  office.

## 2019-07-29 NOTE — Telephone Encounter (Signed)
Please let her know I spoke with [name] and [name]. Due to her kidney dysfunction, she is not a good candidate for osteoporosis medication, and the benefit may not outweigh the risks in her age group. [name] will talk to her about changing to tamoxifen at her visit in June.   Thanks, Regan Rakers

## 2019-07-30 ENCOUNTER — Encounter: Payer: Self-pay | Admitting: Orthopedic Surgery

## 2019-07-30 ENCOUNTER — Ambulatory Visit (INDEPENDENT_AMBULATORY_CARE_PROVIDER_SITE_OTHER): Payer: Medicare Other | Admitting: Orthopedic Surgery

## 2019-07-30 ENCOUNTER — Other Ambulatory Visit: Payer: Self-pay

## 2019-07-30 VITALS — Ht 66.0 in | Wt 183.0 lb

## 2019-07-30 DIAGNOSIS — R531 Weakness: Secondary | ICD-10-CM

## 2019-07-30 DIAGNOSIS — M76822 Posterior tibial tendinitis, left leg: Secondary | ICD-10-CM | POA: Diagnosis not present

## 2019-07-30 DIAGNOSIS — M25571 Pain in right ankle and joints of right foot: Secondary | ICD-10-CM

## 2019-07-30 DIAGNOSIS — Z981 Arthrodesis status: Secondary | ICD-10-CM

## 2019-07-30 MED ORDER — OXYCODONE-ACETAMINOPHEN 5-325 MG PO TABS: 1.0000 | ORAL_TABLET | Freq: Four times a day (QID) | ORAL | 0 refills | Status: DC | PRN

## 2019-07-30 NOTE — Progress Notes (Signed)
Office Visit Note   Patient: [name]           Date of Birth: September 16, 1940           MRN: [id number] Visit Date: 07/30/2019              Requested by: Redmond School, [place],  [place] [address] PCP: Redmond School, MD  Chief Complaint  Patient presents with  . Right Ankle - Pain  . Left Ankle - Pain      HPI: Patient is an 79 year old woman who presents complaining of right foot pain worse than left.  She is status post subtalar and talonavicular fusion.  She states she is having increasing deformity from the midfoot distally.  Patient also states she is starting developed deformity of the left foot.  Patient states she has difficulty with balance and has had some therapy that has helped but states she is having episodes of falling.  Patient subtalar talonavicular fusion was in November 2019.  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  Assessment & Plan: Visit Diagnoses:  1. Posterior tibial tendonitis, left   2. Status post ankle fusion   3. Pain in right ankle and joints of right foot   4. Weakness generalized     Plan: Patient's husband controls her pain medication she was given a prescription for Percocet.  Recommended that she follow through with a previous prescription and a new prescription was written for [employer] for bilateral double upright braces and extra-depth shoes with custom orthotics.  Also recommended strengthening exercising to help with her osteoporosis and to help with her balance.  Recommended follow-up after she has her braces and shoe wear.  Follow-Up Instructions: Return if symptoms worsen or fail to improve.   Ortho Exam  Patient is alert, oriented, no adenopathy, well-dressed, normal affect, normal respiratory effort. Examination patient has an unsteady gait the area of maximal pain is on the heel pad of the right foot this is an area where she has fat pad atrophy the Achilles tendon is nontender to  palpation the plantar fascia is nontender to palpation lateral compression of the calcaneus is nontender.  Patient has had a bone scan which does show osteoporosis.  Examination of her shoewear patient does have instability of the ankle and is currently walking on the medial aspect of her shoe bilaterally.  Patient would benefit from double upright braces.  Imaging: No results found. No images are attached to the encounter.  Labs: Lab Results  Component Value Date   HGBA1C 7.1 (H) 02/24/2018   HGBA1C 7.1 (H) 02/22/2018   HGBA1C 5.7 (H) 12/07/2016   LABURIC 3.7 06/28/2018   LABURIC 7.3 (H) 05/04/2018   LABURIC 9.0 (H) 02/25/2018   REPTSTATUS 10/06/2018 FINAL 10/04/2018   CULT >=100,000 COLONIES/mL CITROBACTER AMALONATICUS (A) 10/04/2018   LABORGA CITROBACTER AMALONATICUS (A) 10/04/2018     Lab Results  Component Value Date   ALBUMIN 3.5 10/04/2018   ALBUMIN 2.3 (L) 03/01/2018   ALBUMIN 2.3 (L) 02/27/2018   LABURIC 3.7 06/28/2018   LABURIC 7.3 (H) 05/04/2018   LABURIC 9.0 (H) 02/25/2018    Lab Results  Component Value Date   MG 2.0 10/04/2018   MG 2.2 02/28/2018   MG 2.2 02/26/2018   No results found for: VD25OH  No results found for: PREALBUMIN CBC EXTENDED Latest Ref Rng & Units 10/04/2018 03/04/2018 03/01/2018  WBC 4.0 - 10.5 K/uL 10.0 9.1 10.1  RBC 3.87 -  5.11 MIL/uL 4.14 3.77(L) 3.21(L)  HGB 12.0 - 15.0 g/dL 12.1 11.3(L) 9.6(L)  HCT 36.0 - 46.0 % 38.1 36.8 30.1(L)  PLT 150 - 400 K/uL 259 400 248  NEUTROABS 1.7 - 7.7 K/uL 7.2 - 7.6  LYMPHSABS 0.7 - 4.0 K/uL 1.7 - 1.4     Body mass index is 29.54 kg/m.  Orders:  No orders of the defined types were placed in this encounter.  Meds ordered this encounter  Medications  . oxyCODONE-acetaminophen (PERCOCET/ROXICET) 5-325 MG tablet    Sig: Take 1 tablet by mouth every 6 (six) hours as needed for severe pain.    Dispense:  28 tablet    Refill:  0     Procedures: No procedures performed  Clinical Data: No  additional findings.  ROS:  All other systems negative, except as noted in the HPI. Review of Systems  Objective: Vital Signs: Ht _0  (1.676 m)   Wt 183 lb (83 kg)   BMI 29.54 kg/m   Specialty Comments:  No specialty comments available.  PMFS History: Patient Active Problem List   Diagnosis Date Noted  . Somnolence, daytime 04/06/2018  . Chronic diastolic CHF (congestive heart failure), NYHA class 2 ([place]) 02/28/2018  . SOB (shortness of breath)   . Hypotension   . Elevated troponin 02/25/2018  . Hypomagnesemia 02/25/2018  . Hypokalemia 02/24/2018  . Urinary retention 02/24/2018  . Sepsis ([place]) 02/24/2018  . Posterior tibial tendinitis of right leg 02/22/2018  . Posterior tibial tendinitis, right leg   . Right atrial mass   . Genetic testing 12/03/2016  . 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 Malignant neoplasm of upper-outer quadrant of left breast in female, estrogen receptor positive ([place]) 11/19/2016  . Chronic idiopathic gout involving toe of left foot without tophus 11/15/2016  . Malignant 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, estrogen receptor positive ([place]) 11/09/2016  . Tendon tear, ankle, left, sequela   . Traumatic rupture of left anterior tibial tendon 04/20/2016  . Acute kidney injury superimposed on CKD ([place])   . 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 Headache, migraine   . Unexplained weight loss 11/17/2015  . 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 Restless legs syndrome 07/24/2015  . Mild cognitive impairment 01/08/2015  . Abnormality of gait 01/08/2015  . Dizziness 12/19/2014  . Preoperative cardiovascular examination 12/11/2013  . GERD (gastroesophageal reflux disease) 10/06/2013  . Syncope 10/05/2013  . Anxiety 10/28/2012    Class: Acute  . Bilateral lower extremity edema 10/28/2012  . CAD S/P percutaneous coronary angioplasty   . Essential hypertension   .  Dyslipidemia, goal LDL below 70    Past Medical History:  Diagnosis Date  . Anemia    have received iron infusions  . Anxiety   . Arthritis     osteoarthritis  . Breast cancer in female [facility])    Bilateral  . CAD S/P percutaneous coronary angioplasty 2007; March 2011   [facility] 3.0 mm 20 mm postdilated 3.6 mm in early mid LAD; status post ISR [place] Balloon PTCA and March 11 along with PCI of distal mid lesion with a 3.0 mm 12 mm MultiLink vision BMS; the proximal stent causes jailing of SP1 and SP2 with ostial 70-80% lesions  . Cancer (HCC)    Melanoma, Squamous cell Carcinoma  . Chronic diastolic CHF (congestive heart failure), NYHA class 2 ([place]) 02/28/2018  . Chronic kidney disease  stage 2   . Dementia ([place])    mild  . Diabetes mellitus type 2 with neurological manifestations ([place])   . Dyslipidemia, goal LDL below 70   . Full dentures   . Genetic testing 12/03/2016   Germline genetic testing was performed through [facility] Common Hereditary Cancers Panel + [facility] Melanoma Panel. This custom panel includes analysis of the following 51 genes: APC, ATM, AXIN2, BAP1, BARD1, BMPR1A, BRCA1, BRCA2, BRIP1, CDH1, CDK4, CDKN2A, CHEK2, CTNNA1, DICER1, EPCAM, GREM1, HOXB13, KIT, MEN1, MITF, MLH1, MSH2, MSH3, MSH6, MUTYH, NBN, NF1, NTHL1, PALB2, PDGFRA, PMS2, POLD1, POL  . GERD (gastroesophageal reflux disease)   . Headache(784.0)   . History of hematuria    Followed by Dr. Gaynelle Arabian  . Hypertension, essential, benign   . Peripheral neuropathy   . Personal history of radiation therapy   . Stroke ([place])   . TIA (transient ischemic attack)    multiple in the past  . Wears glasses     Family History  Problem Relation Age of Onset  . Diabetes Mother   . Hypertension Mother   . Kidney disease Mother   . Hypertension Father   . Emphysema Father   . Heart attack Father   . Heart disease Father   . Arthritis Sister   . Diabetes Sister   . Hypertension Sister   . Breast cancer  Sister 41       treated with mastectomy  . Cervical cancer Sister 48  . Hypertension Brother   . Hyperlipidemia Brother   . Diabetes Brother   . Stroke Brother   . Diabetes Sister   . Hypertension Sister   . Stomach cancer Sister 79  . Hypertension Brother   . ALS Brother        d.62s  . Breast cancer Daughter 1       triple negative treated with lumpectomy, chemo, radiation  . Heart attack Daughter 76       d.45  . COPD Daughter   . Cancer Paternal Aunt        unspecified type  . Cancer Paternal Uncle        unspecified type  . Cancer Paternal Uncle        unspecified type    Past Surgical History:  Procedure Laterality Date  . ABDOMINAL HYSTERECTOMY    . ANKLE FUSION Right 02/22/2018   Procedure: RIGHT SUBTALAR AND TALONAVICULAR FUSION;  Surgeon: Newt Minion, MD;  Location: [facility];  Service: Orthopedics;  Laterality: Right;  . APPENDECTOMY    . BLADDER SUSPENSION    . BREAST LUMPECTOMY Bilateral 2018  . BREAST LUMPECTOMY WITH RADIOACTIVE SEED AND SENTINEL LYMPH NODE BIOPSY Bilateral 12/14/2016   Procedure: BILATERAL BREAST LUMPECTOMY WITH BILATERAL  RADIOACTIVE SEED AND BILATERAL AXILLARY  SENTINEL LYMPH NODE BIOPSY ERAS PATHWAY;  Surgeon: Alphonsa Overall, MD;  Location: [facility];  Service: General;  Laterality: Bilateral;  PECTORAL BLOCK  . CARDIAC CATHETERIZATION  02/24/2006   85% stenosis in the proximal portion of LAD-arrangements made for PCI on 02/25/2006  . CARDIAC CATHETERIZATION  02/25/2006   80% LAD lesion stented with a 3x30m [employer]te stent resulting in reduction of 80% lesion to 0% residual  . CARDIAC CATHETERIZATION  04/26/2006   Medical management  . CARDIAC CATHETERIZATION  06/24/2009   60-70% re-stenosis in the proximal LAD. A 3.25x15 cutting balloon, 3 inflations - 14atm-38sec, 13atm-39sec, and 12atm-40sec reduced to less than 10%. 60% stenosis of the mid/distal LAD stented with a 3x151mMultilink stent.  .Marland Kitchen  CARDIAC CATHETERIZATION  07/09/2009   Medical  management  . CAROTID DOPPLER  06/24/2009   40-59% R ICA stenosis. No significant ICA stenosis noted  . CHOLECYSTECTOMY    . DILATION AND CURETTAGE OF UTERUS    . JOINT REPLACEMENT Left   . LESION REMOVAL Right 03/11/2015   Procedure:  EXCISION OF RIGHT PRE TIBIAL LESION;  Surgeon: Judeth Horn, MD;  Location: [facility];  Service: General;  Laterality: Right;  . MASS EXCISION Left 06/10/2014   Procedure: EXCISION LEFT LOWER LEG LESION;  Surgeon: Doreen Salvage, MD;  Location: [facility];  Service: General;  Laterality: Left;  Marland Kitchen MASS EXCISION Left 09/05/2015   Procedure: EXCISION OF LEFT FOREARM  MASS;  Surgeon: Judeth Horn, MD;  Location: [facility];  Service: General;  Laterality: Left;  . NM MYOVIEW LTD  11/16/2011   5 beats a PVC during recovery.  No skin or infarction.  Reached 5 METs, EKG negative for ischemia   . NM MYOVIEW LTD  10/2011   No ischemia or infarction.  Normal EF.  LOW RISK. (Short bursts of 5 beats NSVT during recovery otherwise normal)  . SHOULDER ARTHROSCOPY  10/15  . SHOULDER ARTHROSCOPY  1995   left  . TEE WITHOUT CARDIOVERSION N/A 08/03/2017   Procedure: TRANSESOPHAGEAL ECHOCARDIOGRAM (TEE);  Surgeon: Skeet Latch, MD;  Location: [facility];  Service: Cardiovascular;; EF 60-65% with no wall motion normalities.  No atrial thrombus noted.  Lightly findings consistent with a lipomatous hypertrophy of the atrial septum.   . TENDON REPAIR Left 05/12/2016   Procedure: Left Anterior Tibial Tendon Reconstruction;  Surgeon: Newt Minion, MD;  Location: [facility];  Service: Orthopedics;  Laterality: Left;  . TOTAL KNEE ARTHROPLASTY  2005   left  . TRANSESOPHAGEAL ECHOCARDIOGRAM  08/03/2016   : EF 60-65% no wall motion normalities.  No atrial thrombus.  Lipomatous hypertrophy of the atrial septum.  No mass noted.  . TRANSTHORACIC ECHOCARDIOGRAM  10/2015   EF 55-60 %. Mild LVH. Mild pulmonary hypertension. Normal valves.  . TRANSTHORACIC  ECHOCARDIOGRAM  07/2017   Vigorous LV function.  EF 65-70%.  Mild diastolic dysfunction with no RWMA. GR 1 DD.  Mild aortic sclerosis/no stenosis.  Questionable right atrial mass discounted with TEE)   . WRIST ARTHROPLASTY  2010   cancer lt wrist   Social History   Occupational History  . Occupation: retired    Fish farm manager: RETIRED  Tobacco Use  . Smoking status: Never Smoker  . Smokeless tobacco: Never Used  Substance and Sexual Activity  . Alcohol use: No  . Drug use: No  . Sexual activity: Never

## 2019-07-30 NOTE — Telephone Encounter (Signed)
Please see note below. 

## 2019-08-03 DIAGNOSIS — R300 Dysuria: Secondary | ICD-10-CM | POA: Diagnosis not present

## 2019-08-03 DIAGNOSIS — B0229 Other postherpetic nervous system involvement: Secondary | ICD-10-CM | POA: Diagnosis not present

## 2019-08-03 DIAGNOSIS — D6859 Other primary thrombophilia: Secondary | ICD-10-CM | POA: Diagnosis not present

## 2019-08-03 DIAGNOSIS — E1165 Type 2 diabetes mellitus with hyperglycemia: Secondary | ICD-10-CM | POA: Diagnosis not present

## 2019-08-03 DIAGNOSIS — E663 Overweight: Secondary | ICD-10-CM | POA: Diagnosis not present

## 2019-08-03 DIAGNOSIS — Z79899 Other long term (current) drug therapy: Secondary | ICD-10-CM | POA: Diagnosis not present

## 2019-08-03 DIAGNOSIS — N184 Chronic kidney disease, stage 4 (severe): Secondary | ICD-10-CM | POA: Diagnosis not present

## 2019-08-03 DIAGNOSIS — E1142 Type 2 diabetes mellitus with diabetic polyneuropathy: Secondary | ICD-10-CM | POA: Diagnosis not present

## 2019-08-03 DIAGNOSIS — E559 Vitamin D deficiency, unspecified: Secondary | ICD-10-CM | POA: Diagnosis not present

## 2019-08-03 DIAGNOSIS — C50919 Malignant neoplasm of unspecified site of unspecified female breast: Secondary | ICD-10-CM | POA: Diagnosis not present

## 2019-08-03 DIAGNOSIS — I509 Heart failure, unspecified: Secondary | ICD-10-CM | POA: Diagnosis not present

## 2019-08-03 DIAGNOSIS — N390 Urinary tract infection, site not specified: Secondary | ICD-10-CM | POA: Diagnosis not present

## 2019-08-03 DIAGNOSIS — E785 Hyperlipidemia, unspecified: Secondary | ICD-10-CM | POA: Diagnosis not present

## 2019-08-14 DIAGNOSIS — G609 Hereditary and idiopathic neuropathy, unspecified: Secondary | ICD-10-CM | POA: Diagnosis not present

## 2019-08-14 DIAGNOSIS — I10 Essential (primary) hypertension: Secondary | ICD-10-CM | POA: Diagnosis not present

## 2019-08-14 DIAGNOSIS — E7800 Pure hypercholesterolemia, unspecified: Secondary | ICD-10-CM | POA: Diagnosis not present

## 2019-08-14 DIAGNOSIS — E1165 Type 2 diabetes mellitus with hyperglycemia: Secondary | ICD-10-CM | POA: Diagnosis not present

## 2019-08-14 DIAGNOSIS — N189 Chronic kidney disease, unspecified: Secondary | ICD-10-CM | POA: Diagnosis not present

## 2019-08-20 IMAGING — CR DG CHEST 2V
2 series · 2 of 2 positions shown · non-contrast
Comparison: PA and lateral chest 08/01/2017.

CLINICAL DATA: Episode of oxygen desaturation today.

EXAM:
CHEST - 2 VIEW

[chest lat]
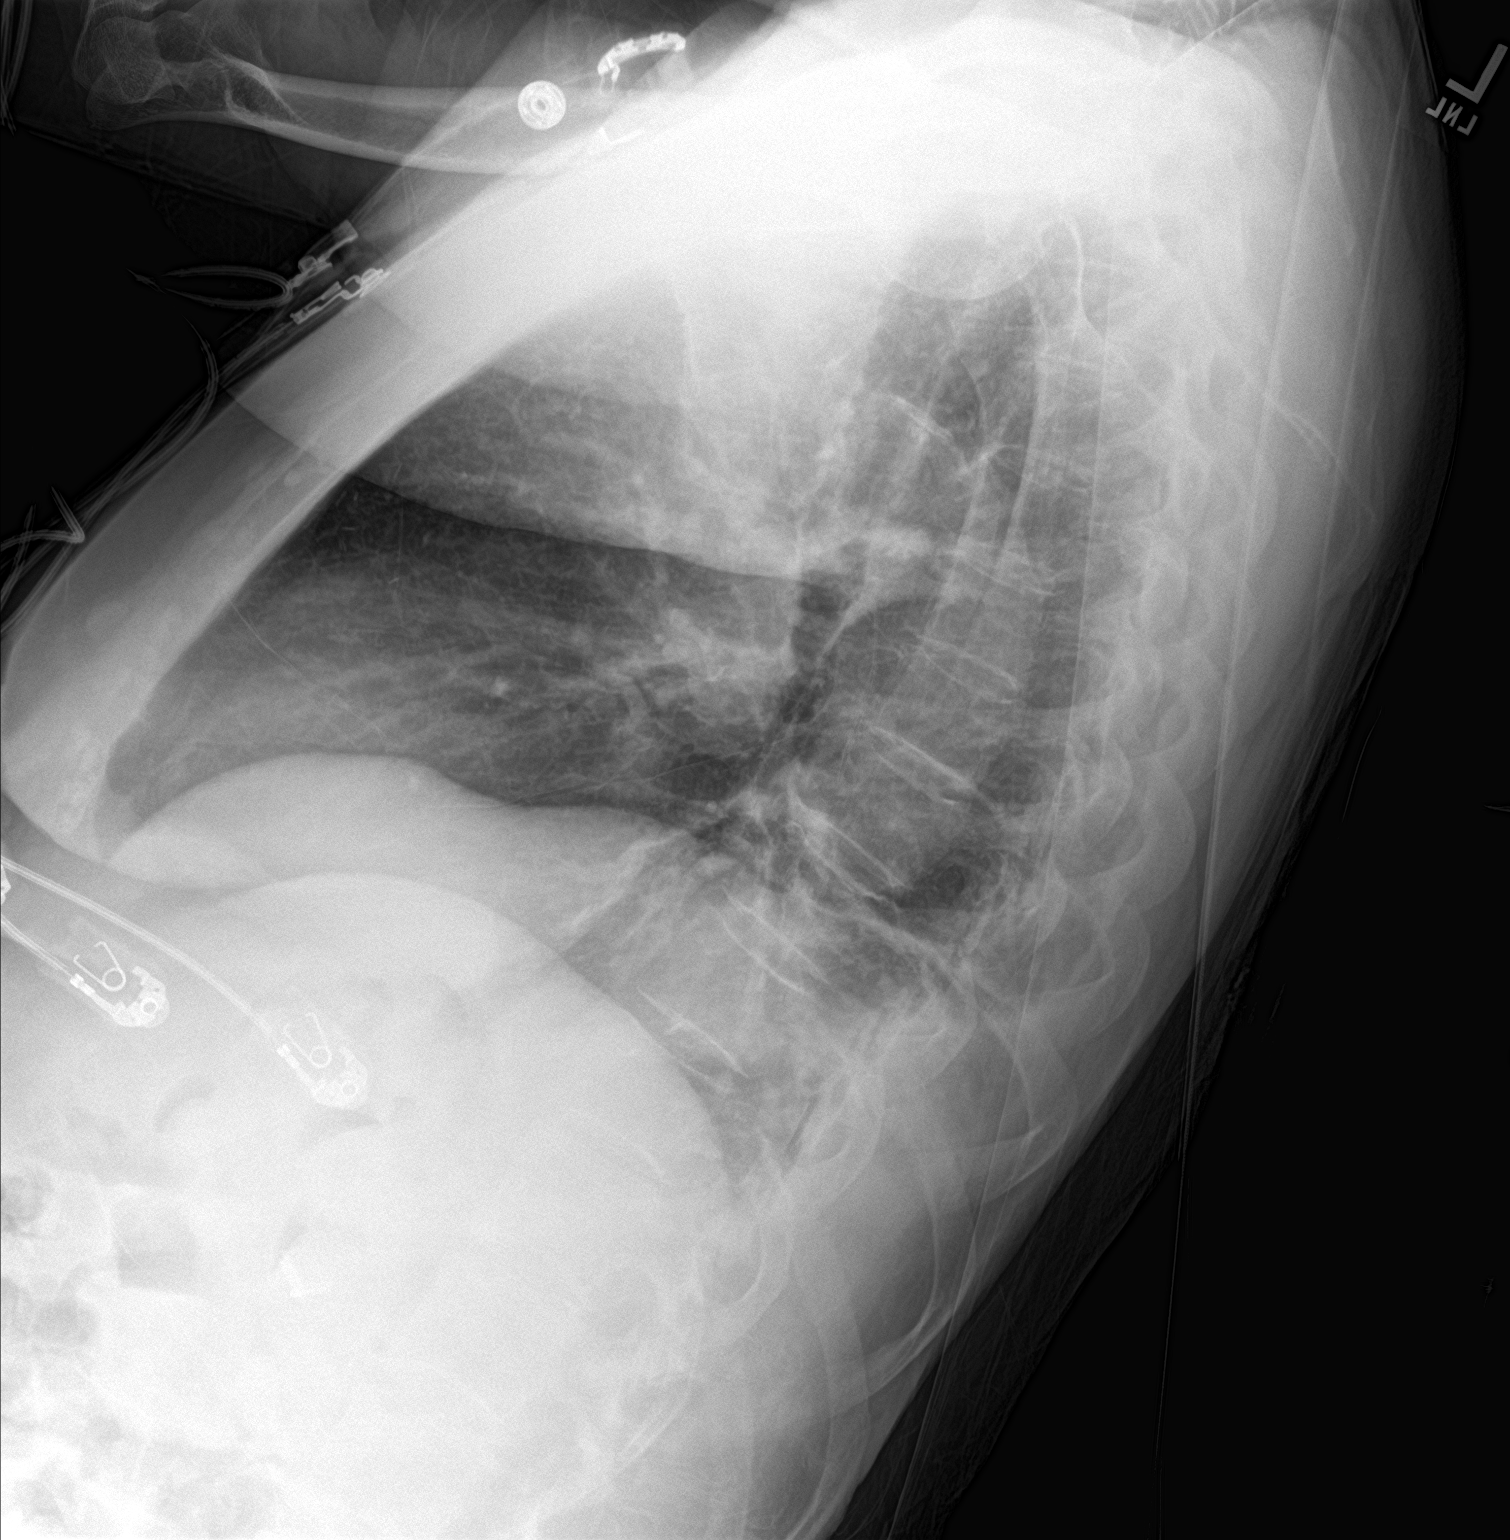

[chest ap]
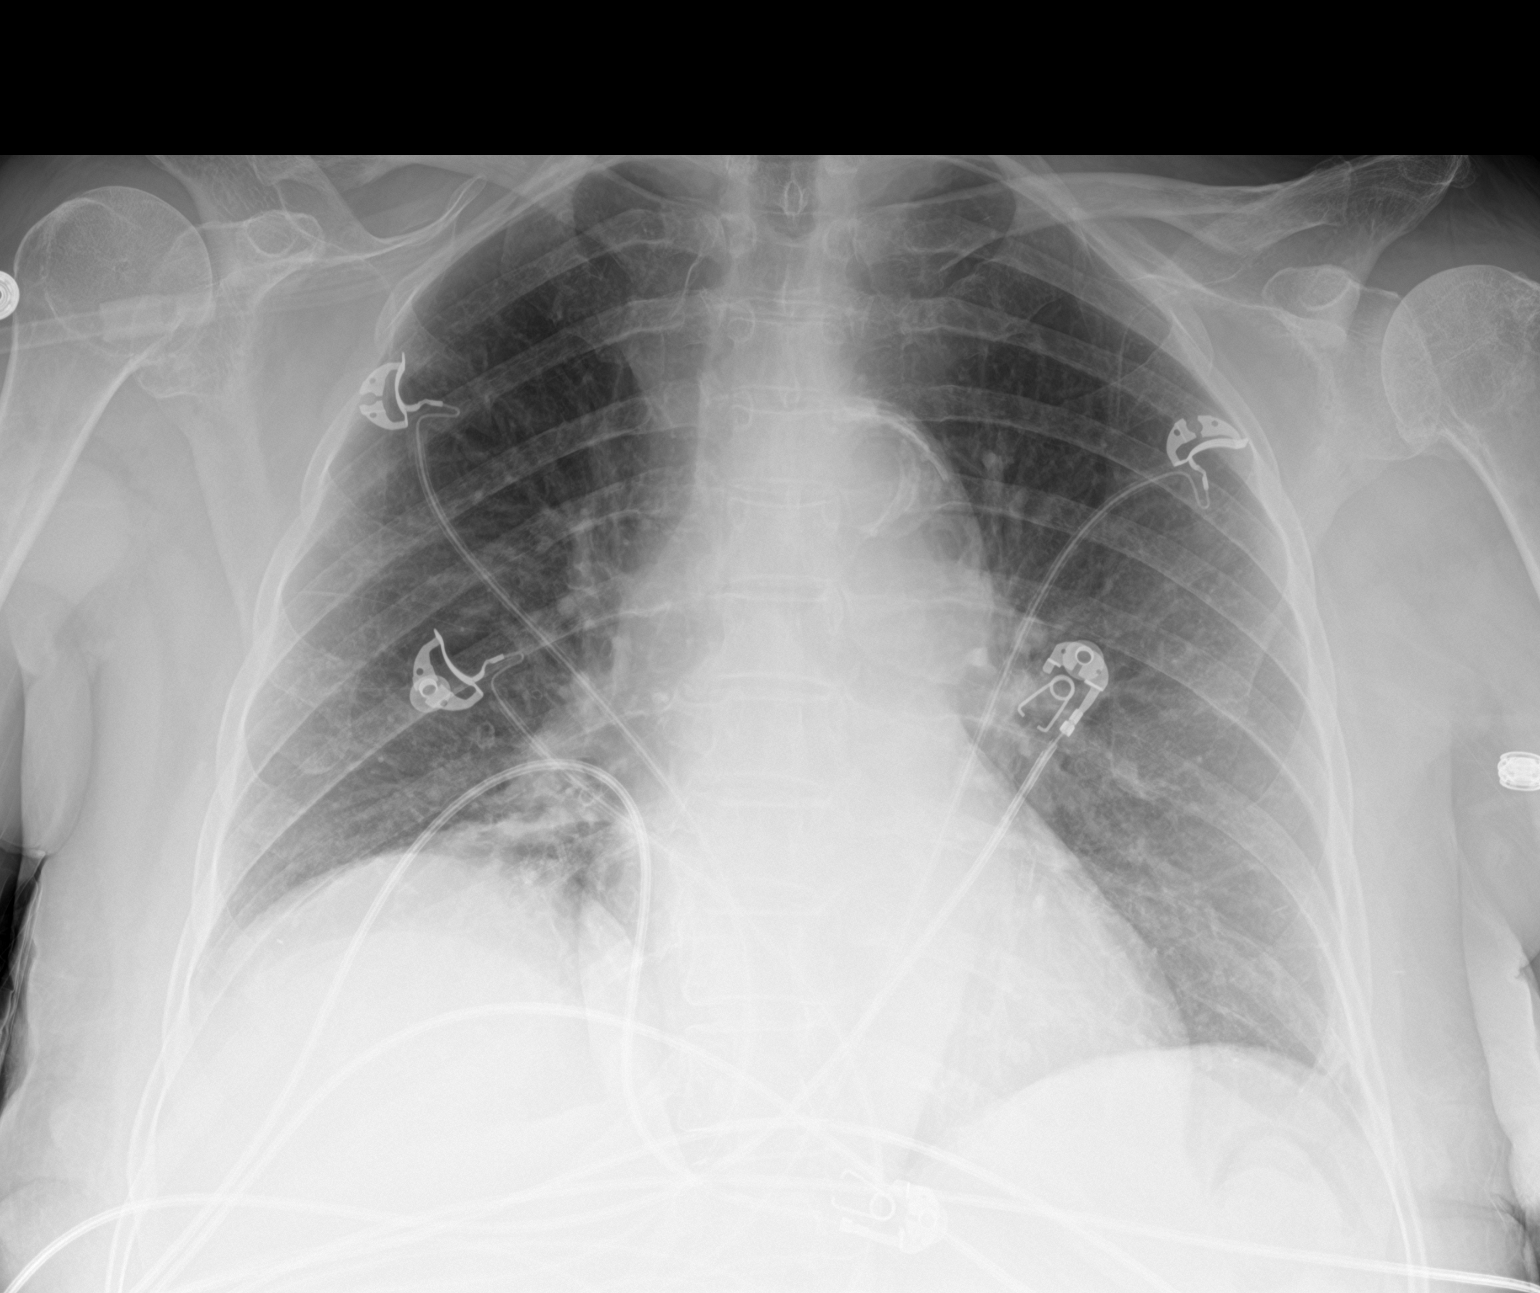

[2 of 2 positions shown; findings below may reference images not displayed]

FINDINGS: The lungs are clear. Bosselation of the right hemidiaphragm is
noted. No pneumothorax or pleural effusion. Heart size is normal.
Aortic atherosclerosis is seen. No acute or focal bony abnormality.
IMPRESSION: No acute disease.

## 2019-08-23 DIAGNOSIS — Z7189 Other specified counseling: Secondary | ICD-10-CM | POA: Diagnosis not present

## 2019-08-23 DIAGNOSIS — C50919 Malignant neoplasm of unspecified site of unspecified female breast: Secondary | ICD-10-CM | POA: Diagnosis not present

## 2019-08-23 DIAGNOSIS — Z0000 Encounter for general adult medical examination without abnormal findings: Secondary | ICD-10-CM | POA: Diagnosis not present

## 2019-08-23 DIAGNOSIS — E1165 Type 2 diabetes mellitus with hyperglycemia: Secondary | ICD-10-CM | POA: Diagnosis not present

## 2019-08-23 DIAGNOSIS — E1142 Type 2 diabetes mellitus with diabetic polyneuropathy: Secondary | ICD-10-CM | POA: Diagnosis not present

## 2019-08-23 DIAGNOSIS — Z0001 Encounter for general adult medical examination with abnormal findings: Secondary | ICD-10-CM | POA: Diagnosis not present

## 2019-08-23 DIAGNOSIS — E11610 Type 2 diabetes mellitus with diabetic neuropathic arthropathy: Secondary | ICD-10-CM | POA: Diagnosis not present

## 2019-08-23 DIAGNOSIS — D6859 Other primary thrombophilia: Secondary | ICD-10-CM | POA: Diagnosis not present

## 2019-08-23 DIAGNOSIS — E1122 Type 2 diabetes mellitus with diabetic chronic kidney disease: Secondary | ICD-10-CM | POA: Diagnosis not present

## 2019-08-23 DIAGNOSIS — F419 Anxiety disorder, unspecified: Secondary | ICD-10-CM | POA: Diagnosis not present

## 2019-08-23 DIAGNOSIS — E559 Vitamin D deficiency, unspecified: Secondary | ICD-10-CM | POA: Diagnosis not present

## 2019-08-23 DIAGNOSIS — B0229 Other postherpetic nervous system involvement: Secondary | ICD-10-CM | POA: Diagnosis not present

## 2019-08-24 IMAGING — DX DG CHEST 1V PORT
1 series · 1 of 1 positions shown · non-contrast
Comparison: 02/24/2018

CLINICAL DATA: Shortness of breath

EXAM:
PORTABLE CHEST 1 VIEW

[chest ap]
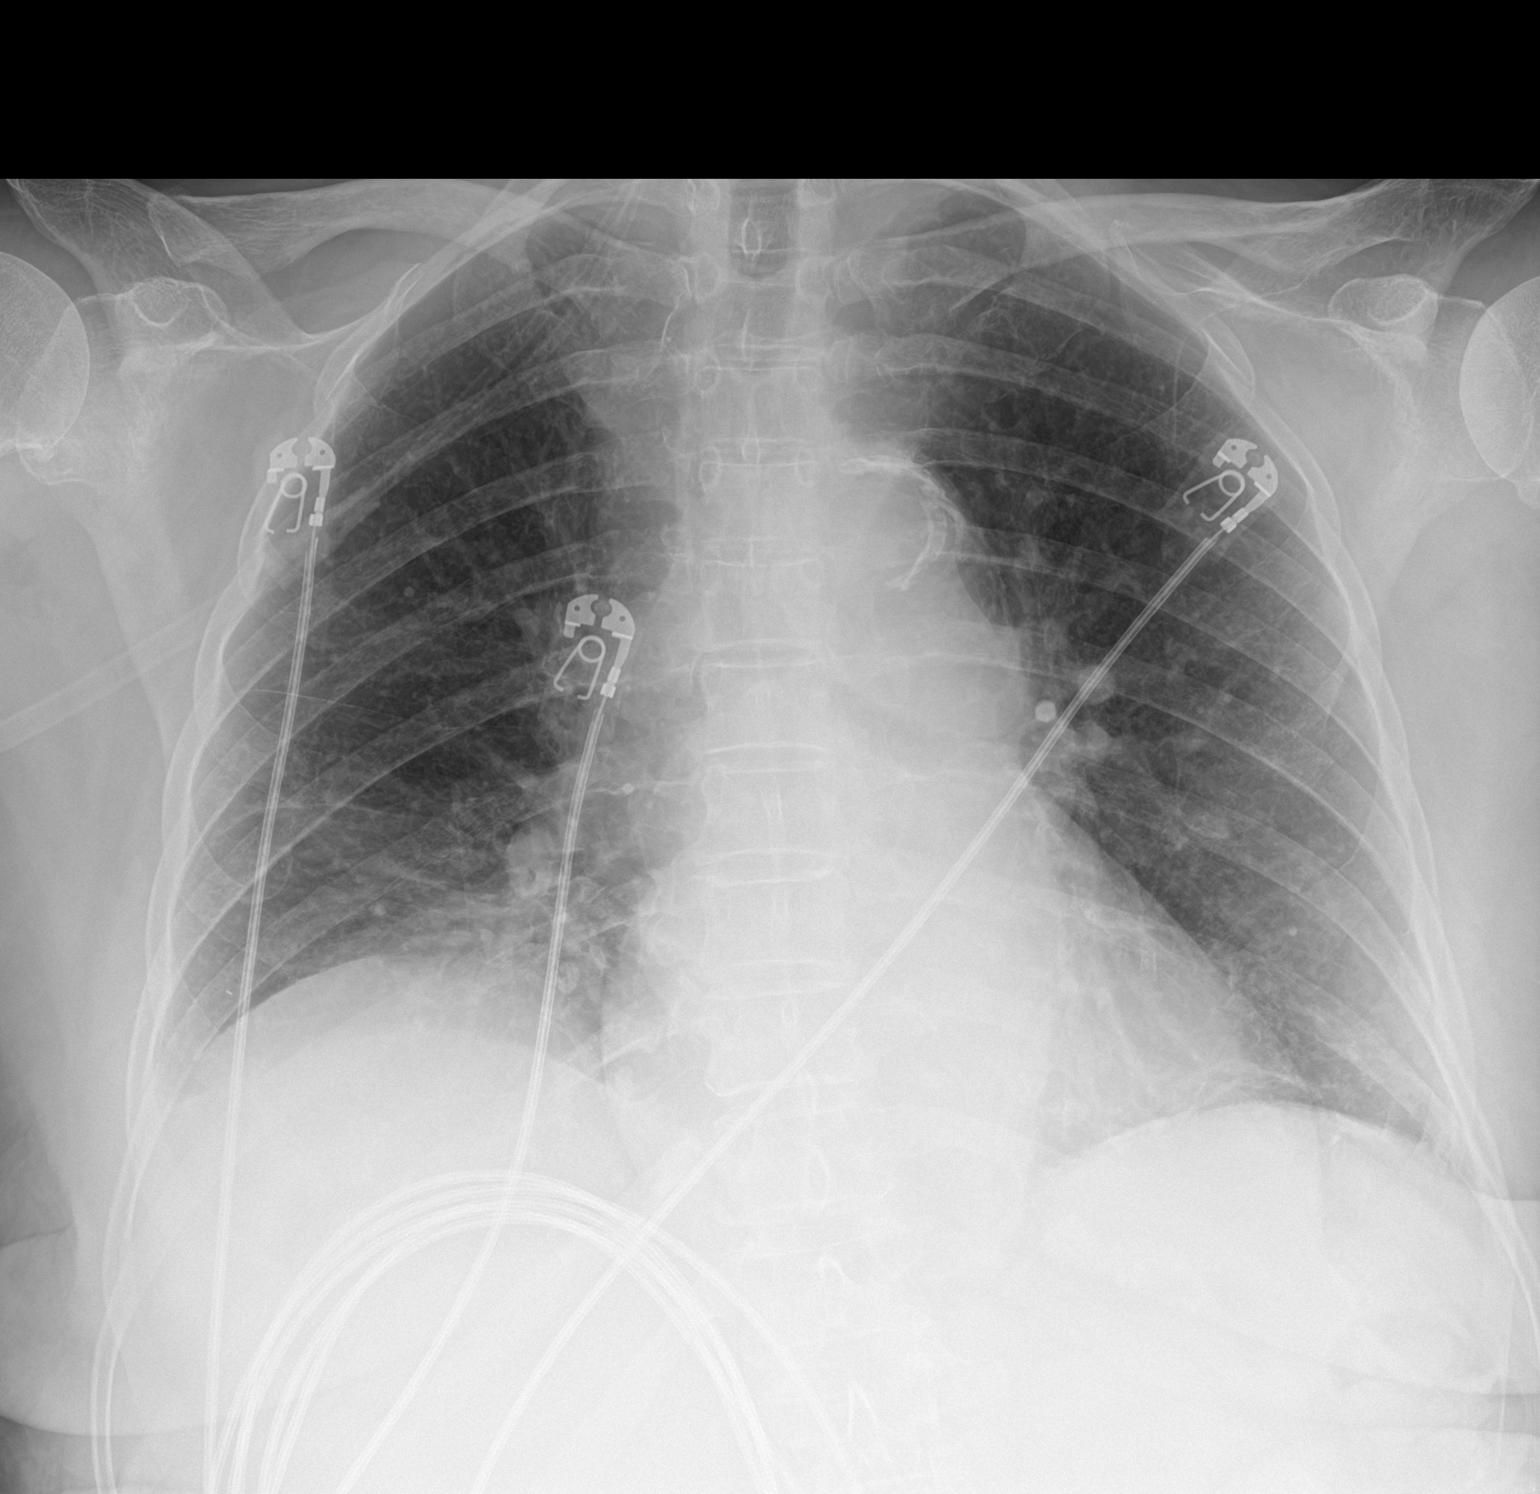

[1 of 1 positions shown; findings below may reference images not displayed]

FINDINGS: Borderline heart size. Bibasilar opacities, likely atelectasis.
Aortic atherosclerosis. Mild vascular congestion. No effusions or
acute bony abnormality.
IMPRESSION: Borderline heart size.  Mild vascular congestion.

Bibasilar atelectasis.

## 2019-08-27 DIAGNOSIS — I130 Hypertensive heart and chronic kidney disease with heart failure and stage 1 through stage 4 chronic kidney disease, or unspecified chronic kidney disease: Secondary | ICD-10-CM | POA: Diagnosis not present

## 2019-08-27 DIAGNOSIS — I5032 Chronic diastolic (congestive) heart failure: Secondary | ICD-10-CM | POA: Diagnosis not present

## 2019-08-27 DIAGNOSIS — N184 Chronic kidney disease, stage 4 (severe): Secondary | ICD-10-CM | POA: Diagnosis not present

## 2019-08-27 DIAGNOSIS — E1122 Type 2 diabetes mellitus with diabetic chronic kidney disease: Secondary | ICD-10-CM | POA: Diagnosis not present

## 2019-08-28 IMAGING — DX DG CHEST 1V PORT
1 series · 1 of 1 positions shown · non-contrast
Comparison: 02/28/2018

CLINICAL DATA: Acute onset left-sided chest pain radiating to back.

EXAM:
PORTABLE CHEST 1 VIEW

[chest ap]
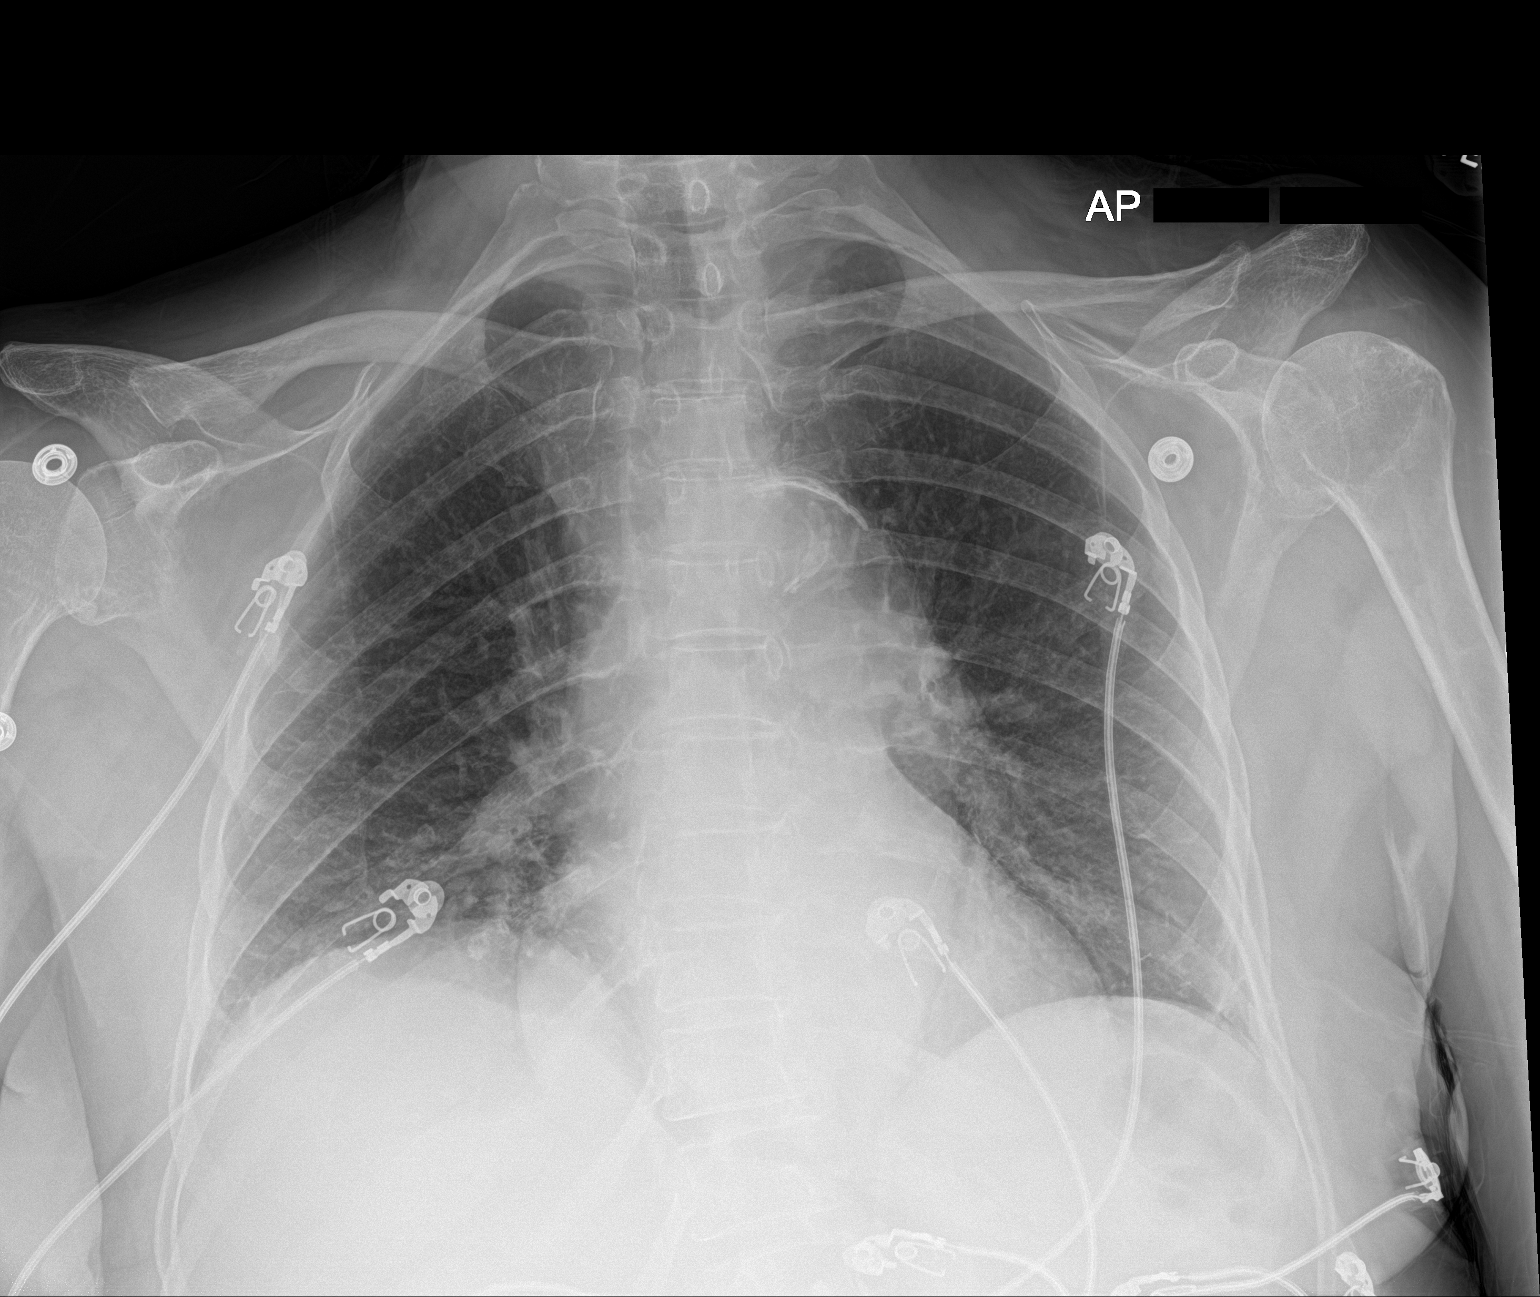

[1 of 1 positions shown; findings below may reference images not displayed]

FINDINGS: The heart size and mediastinal contours are within normal limits.
Aortic atherosclerosis. Both lungs are clear. The visualized
skeletal structures are unremarkable.
IMPRESSION: No active disease.

## 2019-09-03 DIAGNOSIS — S46912A Strain of unspecified muscle, fascia and tendon at shoulder and upper arm level, left arm, initial encounter: Secondary | ICD-10-CM | POA: Diagnosis not present

## 2019-09-10 ENCOUNTER — Ambulatory Visit: Payer: Medicare Other | Admitting: Physician Assistant

## 2019-09-10 ENCOUNTER — Inpatient Hospital Stay: Payer: Medicare Other | Admitting: Hematology

## 2019-09-10 ENCOUNTER — Inpatient Hospital Stay: Payer: Medicare Other

## 2019-09-11 ENCOUNTER — Other Ambulatory Visit: Payer: Self-pay

## 2019-09-11 ENCOUNTER — Ambulatory Visit (INDEPENDENT_AMBULATORY_CARE_PROVIDER_SITE_OTHER): Payer: Medicare Other | Admitting: Physician Assistant

## 2019-09-11 ENCOUNTER — Encounter: Payer: Self-pay | Admitting: Physician Assistant

## 2019-09-11 ENCOUNTER — Ambulatory Visit (INDEPENDENT_AMBULATORY_CARE_PROVIDER_SITE_OTHER): Payer: Medicare Other

## 2019-09-11 VITALS — Ht 66.0 in | Wt 183.0 lb

## 2019-09-11 DIAGNOSIS — M25512 Pain in left shoulder: Secondary | ICD-10-CM

## 2019-09-12 ENCOUNTER — Encounter: Payer: Self-pay | Admitting: Physician Assistant

## 2019-09-12 DIAGNOSIS — M25512 Pain in left shoulder: Secondary | ICD-10-CM | POA: Diagnosis not present

## 2019-09-12 MED ORDER — LIDOCAINE HCL 1 % IJ SOLN
5.0000 mL | INTRAMUSCULAR | Status: AC | PRN
  Administered 2019-09-12: 5 mL

## 2019-09-12 MED ORDER — METHYLPREDNISOLONE ACETATE 40 MG/ML IJ SUSP
40.0000 mg | INTRAMUSCULAR | Status: AC | PRN
  Administered 2019-09-12: 40 mg via INTRA_ARTICULAR

## 2019-09-12 NOTE — Progress Notes (Signed)
Office Visit Note   Patient: [name]           Date of Birth: 02-Aug-1940           MRN: [id number] Visit Date: 09/11/2019              Requested by: Redmond School, [place],  [place] [address] PCP: Redmond School, MD  Chief Complaint  Patient presents with  . Left Shoulder - Pain      HPI: The patient is an 79 year old woman who presented complaining of a 1 week history of left shoulder pain.  She states she was picking up something heavy out of the refrigerator and heard something pop in her left shoulder had immediate onset of pain.  She has been having pain in her biceps that shoots down her arm since.  She is having pain when she tries to lift things with her left hand she feels she must support the left arm with her right arm.  She has had loss of range of motion.  Pain reaching behind her back.  She says she has had previous left shoulder surgery with [name] many many years ago.  She has been to urgent care for this injury without any treatment provided states her radiographs at urgent care were negative.  Assessment & Plan: Visit Diagnoses:  1. Acute pain of left shoulder     Plan: Depo-Medrol injection of the left shoulder.  Patient tolerated this well.  She will follow-up in the office in 3 to 4 weeks if she is no better.  Follow-Up Instructions: Return in about 3 weeks (around 10/02/2019), or if symptoms worsen or fail to improve.   Left Shoulder Exam   Tenderness  Left shoulder tenderness location: biceps tendon nontender.  Range of Motion  Passive abduction: normal   Muscle Strength  The patient has normal left shoulder strength.  Tests  Impingement: positive Drop arm: negative  Other  Erythema: absent Sensation: normal Pulse: present   Comments:  Pain with extension      Patient is alert, oriented, no adenopathy, well-dressed, normal affect, normal respiratory effort.   Imaging: XR Shoulder Left  Result  Date: 09/12/2019 Radiographs of the left shoulder are negative for fracture.  There is no superior migration of the humeral head.  No bony abnormality.  No images are attached to the encounter.  Labs: Lab Results  Component Value Date   HGBA1C 7.1 (H) 02/24/2018   HGBA1C 7.1 (H) 02/22/2018   HGBA1C 5.7 (H) 12/07/2016   LABURIC 3.7 06/28/2018   LABURIC 7.3 (H) 05/04/2018   LABURIC 9.0 (H) 02/25/2018   REPTSTATUS 10/06/2018 FINAL 10/04/2018   CULT >=100,000 COLONIES/mL CITROBACTER AMALONATICUS (A) 10/04/2018   LABORGA CITROBACTER AMALONATICUS (A) 10/04/2018     Lab Results  Component Value Date   ALBUMIN 3.5 10/04/2018   ALBUMIN 2.3 (L) 03/01/2018   ALBUMIN 2.3 (L) 02/27/2018   LABURIC 3.7 06/28/2018   LABURIC 7.3 (H) 05/04/2018   LABURIC 9.0 (H) 02/25/2018    Lab Results  Component Value Date   MG 2.0 10/04/2018   MG 2.2 02/28/2018   MG 2.2 02/26/2018   No results found for: VD25OH  No results found for: PREALBUMIN CBC EXTENDED Latest Ref Rng & Units 10/04/2018 03/04/2018 03/01/2018  WBC 4.0 - 10.5 K/uL 10.0 9.1 10.1  RBC 3.87 - 5.11 MIL/uL 4.14 3.77(L) 3.21(L)  HGB 12.0 - 15.0 g/dL 12.1 11.3(L) 9.6(L)  HCT 36 - 46 % 38.1  36.8 30.1(L)  PLT 150 - 400 K/uL 259 400 248  NEUTROABS 1.7 - 7.7 K/uL 7.2 - 7.6  LYMPHSABS 0.7 - 4.0 K/uL 1.7 - 1.4     Body mass index is 29.54 kg/m.  Orders:  Orders Placed This Encounter  Procedures  . XR Shoulder Left   No orders of the defined types were placed in this encounter.    Procedures: Large Joint Inj: L subacromial bursa on 09/12/2019 8:45 AM Indications: pain Details: 22 G 1.5 in needle Medications: 5 mL lidocaine 1 %; 40 mg methylPREDNISolone acetate 40 MG/ML Consent was given by the patient.      Clinical Data: No additional findings.  ROS:  All other systems negative, except as noted in the HPI. Review of Systems  Constitutional: Negative for chills and fever.  Musculoskeletal: Positive for arthralgias and  myalgias.  Neurological: Negative for weakness and numbness.    Objective: Vital Signs: Ht _0  (1.676 m)   Wt 183 lb (83 kg)   BMI 29.54 kg/m   Specialty Comments:  No specialty comments available.  PMFS History: Patient Active Problem List   Diagnosis Date Noted  . Somnolence, daytime 04/06/2018  . Chronic diastolic CHF (congestive heart failure), NYHA class 2 ([place]) 02/28/2018  . SOB (shortness of breath)   . Hypotension   . Elevated troponin 02/25/2018  . Hypomagnesemia 02/25/2018  . Hypokalemia 02/24/2018  . Urinary retention 02/24/2018  . Sepsis ([place]) 02/24/2018  . Posterior tibial tendinitis of right leg 02/22/2018  . Posterior tibial tendinitis, right leg   . Right atrial mass   . Genetic testing 12/03/2016  . 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 Malignant neoplasm of upper-outer quadrant of left breast in female, estrogen receptor positive ([place]) 11/19/2016  . Chronic idiopathic gout involving toe of left foot without tophus 11/15/2016  . Malignant 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, estrogen receptor positive ([place]) 11/09/2016  . Tendon tear, ankle, left, sequela   . Traumatic rupture of left anterior tibial tendon 04/20/2016  . Acute kidney injury superimposed on CKD ([place])   . 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 Headache, migraine   . Unexplained weight loss 11/17/2015  . 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 Restless legs syndrome 07/24/2015  . Mild cognitive impairment 01/08/2015  . Abnormality of gait 01/08/2015  . Dizziness 12/19/2014  . Preoperative cardiovascular examination 12/11/2013  . GERD (gastroesophageal reflux disease) 10/06/2013  . Syncope 10/05/2013  . Anxiety 10/28/2012    Class: Acute  . Bilateral lower extremity edema 10/28/2012  . CAD S/P percutaneous coronary angioplasty   . Essential hypertension   . Dyslipidemia, goal LDL below 70    Past  Medical History:  Diagnosis Date  . Anemia    have received iron infusions  . Anxiety   . Arthritis     osteoarthritis  . Breast cancer in female [facility])    Bilateral  . CAD S/P percutaneous coronary angioplasty 2007; March 2011   [facility] [facility] 3.0 mm 20 mm postdilated 3.6 mm in early mid LAD; status post ISR Cutting Balloon PTCA and March 11 along with PCI of distal mid lesion with a 3.0 mm 12 mm MultiLink vision BMS; the proximal stent causes jailing of SP1 and SP2 with ostial 70-80% lesions  . Cancer (HCC)    Melanoma, Squamous cell Carcinoma  . Chronic diastolic CHF (congestive heart failure), NYHA class 2 ([place]) 02/28/2018  .  Chronic kidney disease    stage 2   . Dementia ([place])    mild  . Diabetes mellitus type 2 with neurological manifestations ([place])   . Dyslipidemia, goal LDL below 70   . Full dentures   . Genetic testing 12/03/2016   Germline genetic testing was performed through [facility]'s Common Hereditary Cancers Panel + [facility]'s Melanoma Panel. This custom panel includes analysis of the following 51 genes: APC, ATM, AXIN2, BAP1, BARD1, BMPR1A, BRCA1, BRCA2, BRIP1, CDH1, CDK4, CDKN2A, CHEK2, CTNNA1, DICER1, EPCAM, GREM1, HOXB13, KIT, MEN1, MITF, MLH1, MSH2, MSH3, MSH6, MUTYH, NBN, NF1, NTHL1, PALB2, PDGFRA, PMS2, POLD1, POL  . GERD (gastroesophageal reflux disease)   . Headache(784.0)   . History of hematuria    Followed by Dr. Gaynelle Arabian  . Hypertension, essential, benign   . Peripheral neuropathy   . Personal history of radiation therapy   . Stroke ([place])   . TIA (transient ischemic attack)    multiple in the past  . Wears glasses     Family History  Problem Relation Age of Onset  . Diabetes Mother   . Hypertension Mother   . Kidney disease Mother   . Hypertension Father   . Emphysema Father   . Heart attack Father   . Heart disease Father   . Arthritis Sister   . Diabetes Sister   . Hypertension Sister   . Breast cancer Sister 32       treated with mastectomy  .  Cervical cancer Sister 55  . Hypertension Brother   . Hyperlipidemia Brother   . Diabetes Brother   . Stroke Brother   . Diabetes Sister   . Hypertension Sister   . Stomach cancer Sister 24  . Hypertension Brother   . ALS Brother        d.62s  . Breast cancer Daughter 59       triple negative treated with lumpectomy, chemo, radiation  . Heart attack Daughter 63       d.45  . COPD Daughter   . Cancer Paternal Aunt        unspecified type  . Cancer Paternal Uncle        unspecified type  . Cancer Paternal Uncle        unspecified type    Past Surgical History:  Procedure Laterality Date  . ABDOMINAL HYSTERECTOMY    . ANKLE FUSION Right 02/22/2018   Procedure: RIGHT SUBTALAR AND TALONAVICULAR FUSION;  Surgeon: Newt Minion, MD;  Location: [facility];  Service: Orthopedics;  Laterality: Right;  . APPENDECTOMY    . BLADDER SUSPENSION    . BREAST LUMPECTOMY Bilateral 2018  . BREAST LUMPECTOMY WITH RADIOACTIVE SEED AND SENTINEL LYMPH NODE BIOPSY Bilateral 12/14/2016   Procedure: BILATERAL BREAST LUMPECTOMY WITH BILATERAL  RADIOACTIVE SEED AND BILATERAL AXILLARY  SENTINEL LYMPH NODE BIOPSY ERAS PATHWAY;  Surgeon: Alphonsa Overall, MD;  Location: [facility];  Service: General;  Laterality: Bilateral;  PECTORAL BLOCK  . CARDIAC CATHETERIZATION  02/24/2006   85% stenosis in the proximal portion of LAD-arrangements made for PCI on 02/25/2006  . CARDIAC CATHETERIZATION  02/25/2006   80% LAD lesion stented with a 3x19m [employer] stent resulting in reduction of 80% lesion to 0% residual  . CARDIAC CATHETERIZATION  04/26/2006   Medical management  . CARDIAC CATHETERIZATION  06/24/2009   60-70% re-stenosis in the proximal LAD. A 3.25x15 cutting balloon, 3 inflations - 14atm-38sec, 13atm-39sec, and 12atm-40sec reduced to less than 10%. 60% stenosis of the mid/distal LAD stented  with a 3x44m Multilink stent.  .Marland KitchenCARDIAC CATHETERIZATION  07/09/2009   Medical management  . CAROTID DOPPLER  06/24/2009   40-59%  R ICA stenosis. No significant ICA stenosis noted  . CHOLECYSTECTOMY    . DILATION AND CURETTAGE OF UTERUS    . JOINT REPLACEMENT Left   . LESION REMOVAL Right 03/11/2015   Procedure:  EXCISION OF RIGHT PRE TIBIAL LESION;  Surgeon: JJudeth Horn MD;  [place] [facility]  Service: General;  Laterality: Right;  . MASS EXCISION Left 06/10/2014   Procedure: EXCISION LEFT LOWER LEG LESION;  Surgeon: JDoreen Salvage MD;  [place] [facility]  Service: General;  Laterality: Left;  .Marland KitchenMASS EXCISION Left 09/05/2015   Procedure: EXCISION OF LEFT FOREARM  MASS;  Surgeon: JJudeth Horn MD;  [place] [facility]  Service: General;  Laterality: Left;  . NM MYOVIEW LTD  11/16/2011   5 beats a PVC during recovery.  No skin or infarction.  Reached 5 METs, EKG negative for ischemia   . NM MYOVIEW LTD  10/2011   No ischemia or infarction.  Normal EF.  LOW RISK. (Short bursts of 5 beats NSVT during recovery otherwise normal)  . SHOULDER ARTHROSCOPY  10/15  . SHOULDER ARTHROSCOPY  1995   left  . TEE WITHOUT CARDIOVERSION N/A 08/03/2017   Procedure: TRANSESOPHAGEAL ECHOCARDIOGRAM (TEE);  Surgeon: RSkeet Latch MD;  [place] [facility]  Service: Cardiovascular;; EF 60-65% with no wall motion normalities.  No atrial thrombus noted.  Lightly findings consistent with a lipomatous hypertrophy of the atrial septum.   . TENDON REPAIR Left 05/12/2016   Procedure: Left Anterior Tibial Tendon Reconstruction;  Surgeon: MNewt Minion MD;  [place] [facility]  Service: Orthopedics;  Laterality: Left;  . TOTAL KNEE ARTHROPLASTY  2005   left  . TRANSESOPHAGEAL ECHOCARDIOGRAM  08/03/2016   : EF 60-65% no wall motion normalities.  No atrial thrombus.  Lipomatous hypertrophy of the atrial septum.  No mass noted.  . TRANSTHORACIC ECHOCARDIOGRAM  10/2015   EF 55-60 %. Mild LVH. Mild pulmonary hypertension. Normal valves.  . TRANSTHORACIC ECHOCARDIOGRAM  07/2017   Vigorous LV function.  EF  65-70%.  Mild diastolic dysfunction with no RWMA. GR 1 DD.  Mild aortic sclerosis/no stenosis.  Questionable right atrial mass discounted with TEE)   . WRIST ARTHROPLASTY  2010   cancer lt wrist   Social History   Occupational History  . Occupation: retired    EF[employer] farm manager RETIRED  Tobacco Use  . Smoking status: Never Smoker  . Smokeless tobacco: Never Used  Vaping Use  . Vaping Use: Never used  Substance and Sexual Activity  . Alcohol use: No  . Drug use: No  . Sexual activity: Never

## 2019-09-19 ENCOUNTER — Ambulatory Visit: Payer: Medicare Other | Admitting: *Deleted

## 2019-09-26 ENCOUNTER — Other Ambulatory Visit: Payer: Self-pay | Admitting: *Deleted

## 2019-09-26 DIAGNOSIS — N184 Chronic kidney disease, stage 4 (severe): Secondary | ICD-10-CM | POA: Diagnosis not present

## 2019-09-26 DIAGNOSIS — E7800 Pure hypercholesterolemia, unspecified: Secondary | ICD-10-CM | POA: Diagnosis not present

## 2019-09-26 DIAGNOSIS — E1165 Type 2 diabetes mellitus with hyperglycemia: Secondary | ICD-10-CM | POA: Diagnosis not present

## 2019-09-26 DIAGNOSIS — I130 Hypertensive heart and chronic kidney disease with heart failure and stage 1 through stage 4 chronic kidney disease, or unspecified chronic kidney disease: Secondary | ICD-10-CM | POA: Diagnosis not present

## 2019-09-26 DIAGNOSIS — I10 Essential (primary) hypertension: Secondary | ICD-10-CM | POA: Diagnosis not present

## 2019-09-26 DIAGNOSIS — N189 Chronic kidney disease, unspecified: Secondary | ICD-10-CM | POA: Diagnosis not present

## 2019-09-26 DIAGNOSIS — E1122 Type 2 diabetes mellitus with diabetic chronic kidney disease: Secondary | ICD-10-CM | POA: Diagnosis not present

## 2019-09-26 DIAGNOSIS — I5032 Chronic diastolic (congestive) heart failure: Secondary | ICD-10-CM | POA: Diagnosis not present

## 2019-09-26 DIAGNOSIS — G609 Hereditary and idiopathic neuropathy, unspecified: Secondary | ICD-10-CM | POA: Diagnosis not present

## 2019-09-26 NOTE — Patient Outreach (Signed)
[facility]) Care Management  09/26/2019  [name] [birth year] [id number]   RN Health Coach attempted follow up outreach call to patient.  Patient was unavailable. HIPPA compliance voicemail message left with return callback number.  Plan: RN will call patient again within 30 days.  [place] Care Management [phone]

## 2019-09-27 ENCOUNTER — Telehealth: Payer: Self-pay | Admitting: Hematology

## 2019-09-27 ENCOUNTER — Other Ambulatory Visit: Payer: Self-pay | Admitting: Adult Health

## 2019-09-27 NOTE — Telephone Encounter (Signed)
Called pt per 7/1 sch message- no answer and vmail full. Unable to leave message for patient to call back for reschedule.

## 2019-09-28 ENCOUNTER — Inpatient Hospital Stay: Payer: Medicare Other | Admitting: Hematology

## 2019-09-28 ENCOUNTER — Inpatient Hospital Stay: Payer: Medicare Other | Attending: Nurse Practitioner

## 2019-10-02 ENCOUNTER — Other Ambulatory Visit: Payer: Self-pay | Admitting: Family

## 2019-10-02 DIAGNOSIS — N184 Chronic kidney disease, stage 4 (severe): Secondary | ICD-10-CM | POA: Diagnosis not present

## 2019-10-02 DIAGNOSIS — M1A0720 Idiopathic chronic gout, left ankle and foot, without tophus (tophi): Secondary | ICD-10-CM

## 2019-10-04 DIAGNOSIS — Z6828 Body mass index (BMI) 28.0-28.9, adult: Secondary | ICD-10-CM | POA: Diagnosis not present

## 2019-10-04 DIAGNOSIS — R251 Tremor, unspecified: Secondary | ICD-10-CM | POA: Diagnosis not present

## 2019-10-04 DIAGNOSIS — Z794 Long term (current) use of insulin: Secondary | ICD-10-CM | POA: Diagnosis not present

## 2019-10-04 DIAGNOSIS — R159 Full incontinence of feces: Secondary | ICD-10-CM | POA: Diagnosis not present

## 2019-10-04 DIAGNOSIS — R2681 Unsteadiness on feet: Secondary | ICD-10-CM | POA: Diagnosis not present

## 2019-10-04 DIAGNOSIS — E1142 Type 2 diabetes mellitus with diabetic polyneuropathy: Secondary | ICD-10-CM | POA: Diagnosis not present

## 2019-10-04 DIAGNOSIS — F330 Major depressive disorder, recurrent, mild: Secondary | ICD-10-CM | POA: Diagnosis not present

## 2019-10-04 DIAGNOSIS — Z79899 Other long term (current) drug therapy: Secondary | ICD-10-CM | POA: Diagnosis not present

## 2019-10-04 DIAGNOSIS — E663 Overweight: Secondary | ICD-10-CM | POA: Diagnosis not present

## 2019-10-05 ENCOUNTER — Other Ambulatory Visit: Payer: Self-pay | Admitting: General Practice

## 2019-10-05 DIAGNOSIS — Z79899 Other long term (current) drug therapy: Secondary | ICD-10-CM | POA: Diagnosis not present

## 2019-10-05 DIAGNOSIS — R251 Tremor, unspecified: Secondary | ICD-10-CM

## 2019-10-08 DIAGNOSIS — D631 Anemia in chronic kidney disease: Secondary | ICD-10-CM | POA: Diagnosis not present

## 2019-10-08 DIAGNOSIS — N1832 Chronic kidney disease, stage 3b: Secondary | ICD-10-CM | POA: Diagnosis not present

## 2019-10-08 DIAGNOSIS — I129 Hypertensive chronic kidney disease with stage 1 through stage 4 chronic kidney disease, or unspecified chronic kidney disease: Secondary | ICD-10-CM | POA: Diagnosis not present

## 2019-10-08 DIAGNOSIS — N2581 Secondary hyperparathyroidism of renal origin: Secondary | ICD-10-CM | POA: Diagnosis not present

## 2019-10-19 ENCOUNTER — Other Ambulatory Visit: Payer: Self-pay

## 2019-10-19 ENCOUNTER — Other Ambulatory Visit: Payer: Self-pay | Admitting: Podiatry

## 2019-10-19 ENCOUNTER — Ambulatory Visit (INDEPENDENT_AMBULATORY_CARE_PROVIDER_SITE_OTHER): Payer: Medicare Other | Admitting: Podiatry

## 2019-10-19 ENCOUNTER — Ambulatory Visit (INDEPENDENT_AMBULATORY_CARE_PROVIDER_SITE_OTHER): Payer: Medicare Other

## 2019-10-19 DIAGNOSIS — M14671 Charcot's joint, right ankle and foot: Secondary | ICD-10-CM | POA: Diagnosis not present

## 2019-10-19 DIAGNOSIS — M14672 Charcot's joint, left ankle and foot: Secondary | ICD-10-CM

## 2019-10-19 DIAGNOSIS — L089 Local infection of the skin and subcutaneous tissue, unspecified: Secondary | ICD-10-CM

## 2019-10-19 DIAGNOSIS — E11610 Type 2 diabetes mellitus with diabetic neuropathic arthropathy: Secondary | ICD-10-CM | POA: Diagnosis not present

## 2019-10-19 DIAGNOSIS — M79672 Pain in left foot: Secondary | ICD-10-CM | POA: Diagnosis not present

## 2019-10-19 DIAGNOSIS — M79671 Pain in right foot: Secondary | ICD-10-CM

## 2019-10-19 DIAGNOSIS — E118 Type 2 diabetes mellitus with unspecified complications: Secondary | ICD-10-CM | POA: Diagnosis not present

## 2019-10-19 DIAGNOSIS — E1165 Type 2 diabetes mellitus with hyperglycemia: Secondary | ICD-10-CM | POA: Diagnosis not present

## 2019-10-19 DIAGNOSIS — S90821A Blister (nonthermal), right foot, initial encounter: Secondary | ICD-10-CM

## 2019-10-19 DIAGNOSIS — IMO0002 Reserved for concepts with insufficient information to code with codable children: Secondary | ICD-10-CM

## 2019-10-19 MED ORDER — DOXYCYCLINE HYCLATE 100 MG PO TABS: 100.0000 mg | ORAL_TABLET | Freq: Two times a day (BID) | ORAL | 0 refills | Status: DC

## 2019-10-23 ENCOUNTER — Encounter: Payer: Self-pay | Admitting: Podiatry

## 2019-10-23 NOTE — Progress Notes (Signed)
Subjective:  Patient ID: [name], female    DOB: 1941/01/09,  MRN: [id number]  Chief Complaint  Patient presents with  . Foot Pain    pt is here for bil charcot foot, as well as instability in the foot as well.    79 y.o. female presents with the above complaint.  Patient presents with bilateral Charcot deformity with a concern for blister ration to the right fifth digit.  Patient states that this has been going on for the last couple of weeks but the Charcot foot has been going on for last couple of years.  Patient states that she has had previous surgery done by Dr. Lajoyce Corners for Charcot management.  Patient states that she also has braces for Charcot foot that she just received over the last couple of weeks.  She believes that this may have led to blister formation.  Patient states is painful when walking on it.  She states is painful when six in getting up to move.  She often states that her foot will roll inward especially when she is not wearing her braces.  She denies any other acute complaints.  She just wants to make sure that there is no infection present.  And she would like to discuss further braces management.   Review of Systems: Negative except as noted in the HPI. Denies N/V/F/Ch.  Past Medical History:  Diagnosis Date  . Anemia    have received iron infusions  . Anxiety   . Arthritis     osteoarthritis  . Breast cancer in female [facility])    Bilateral  . CAD S/P percutaneous coronary angioplasty 2007; March 2011   [facility] 3.0 mm 20 mm postdilated 3.6 mm in early mid LAD; status post ISR Cutting Balloon PTCA and March 11 along with PCI of distal mid lesion with a 3.0 mm 12 mm MultiLink vision BMS; the proximal stent causes jailing of SP1 and SP2 with ostial 70-80% lesions  . Cancer (HCC)    Melanoma, Squamous cell Carcinoma  . Chronic diastolic CHF (congestive heart failure), NYHA class 2 (HCC) 02/28/2018  . Chronic kidney disease    stage 2   . Dementia (HCC)     mild  . Diabetes mellitus type 2 with neurological manifestations (HCC)   . Dyslipidemia, goal LDL below 70   . Full dentures   . Genetic testing 12/03/2016   Germline genetic testing was performed through [facility]'s Common Hereditary Cancers Panel + [facility]'s Melanoma Panel. This custom panel includes analysis of the following 51 genes: APC, ATM, AXIN2, BAP1, BARD1, BMPR1A, BRCA1, BRCA2, BRIP1, CDH1, CDK4, CDKN2A, CHEK2, CTNNA1, DICER1, EPCAM, GREM1, HOXB13, KIT, MEN1, MITF, MLH1, MSH2, MSH3, MSH6, MUTYH, NBN, NF1, NTHL1, PALB2, PDGFRA, PMS2, POLD1, POL  . GERD (gastroesophageal reflux disease)   . Headache(784.0)   . History of hematuria    Followed by Dr. Patsi Sears  . Hypertension, essential, benign   . Peripheral neuropathy   . Personal history of radiation therapy   . Stroke (HCC)   . TIA (transient ischemic attack)    multiple in the past  . Wears glasses     Current Outpatient Medications:  .  bumetanide (BUMEX) 2 MG tablet, Take 1 tablet (2 mg total) by mouth 2 (two) times daily. (Patient taking differently: Take 2 mg by mouth 2 (two) times daily. 2 tablets in the morning and 1 at noon), Disp: 60 tablet, Rfl: 0 .  Calcium Carb-Cholecalciferol (CALCIUM 600+D3) 600-800 MG-UNIT TABS, Take 1 tablet  by mouth daily., Disp: , Rfl:  .  clopidogrel (PLAVIX) 75 MG tablet, TAKE 1 TABLET DAILY, Disp: 90 tablet, Rfl: 3 .  COLCRYS 0.6 MG tablet, TAKE 1 TABLET DAILY AS NEEDED FOR GOUT FLARE., Disp: 90 tablet, Rfl: 3 .  Cranberry 500 MG TABS, Take 500 mg by mouth every evening., Disp: , Rfl:  .  diazepam (VALIUM) 5 MG tablet, Take 1 tablet (5 mg total) by mouth 2 (two) times daily as needed for anxiety., Disp: 10 tablet, Rfl: 0 .  exemestane (AROMASIN) 25 MG tablet, TAKE 1 TABLET DAILY AFTER BREAKFAST, Disp: 90 tablet, Rfl: 3 .  febuxostat (ULORIC) 40 MG tablet, TAKE 1 TABLET DAILY, Disp: 90 tablet, Rfl: 3 .  gabapentin (NEURONTIN) 300 MG capsule, Take 300-600 mg by mouth daily as needed (nerve  pain)., Disp: , Rfl:  .  gabapentin (NEURONTIN) 300 MG capsule, Take 1 capsule (300 mg total) by mouth 2 (two) times daily. Take 1 during the day if needed and 2 at night, Disp: 270 capsule, Rfl: 1 .  Insulin Lispro (HUMALOG KWIKPEN [place]), Inject into the skin. 100-150= 2 units, 151-200= 3 units and 201-250= 4 units, Disp: , Rfl:  .  insulin NPH Human (HUMULIN N,NOVOLIN N) 100 UNIT/ML injection, Inject 20 Units into the skin See admin instructions. Inject 20 units in the morning & inject 25 units after supper., Disp: , Rfl:  .  insulin NPH-regular Human (70-30) 100 UNIT/ML injection, Inject 20 Units into the skin 2 (two) times daily with a meal., Disp: , Rfl:  .  Krill Oil 500 MG CAPS, Take 500 mg by mouth daily., Disp: , Rfl:  .  Methylfol-Methylcob-Acetylcyst (CEREFOLIN NAC) 6-2-600 MG TABS, TAKE 1 TABLET BY MOUTH EVERY DAY, Disp: 90 tablet, Rfl: 3 .  metolazone (ZAROXOLYN) 2.5 MG tablet, TAKE 1 TABLET DAILY 30 MINUTES BEFORE THE MORNING DOSE OF BUMEX AS DIRECTED, Disp: 90 tablet, Rfl: 1 .  metoprolol succinate (TOPROL XL) 25 MG 24 hr tablet, Take 0.5 tablets (12.5 mg total) by mouth at bedtime., Disp: 45 tablet, Rfl: 3 .  mirtazapine (REMERON) 15 MG tablet, Take 15 mg by mouth at bedtime. Taking 1 1/2 tablet at bedtime, Disp: , Rfl:  .  nitroGLYCERIN (NITROSTAT) 0.4 MG SL tablet, Place 0.4 mg under the tongue every 5 (five) minutes as needed for chest pain., Disp: , Rfl:  .  oxyCODONE-acetaminophen (PERCOCET/ROXICET) 5-325 MG tablet, Take 1 tablet by mouth every 6 (six) hours as needed for severe pain., Disp: 28 tablet, Rfl: 0 .  pantoprazole (PROTONIX) 40 MG tablet, TAKE 1 TABLET DAILY (CONTACT OFFICE FOR ADDITIONAL REFILLS, FINAL ATTEMPT) (Patient taking differently: Take 40 mg by mouth daily before breakfast. ), Disp: 90 tablet, Rfl: 0 .  potassium chloride (KLOR-CON) 20 MEQ packet, Take 20 mEq by mouth 2 (two) times daily. 2 tablets in the morning and 1 at bedtime, Disp: , Rfl:  .  pramipexole  (MIRAPEX) 0.25 MG tablet, TAKE 1 TO 2 TABLETS AS INSTRUCTED, 1 TABLET AT 4:00 P.M. AND 2 TABLETS AT BEDTIME, Disp: 270 tablet, Rfl: 1 .  rosuvastatin (CRESTOR) 5 MG tablet, Take 5 mg by mouth daily., Disp: , Rfl:  .  sennosides-docusate sodium (SENOKOT-S) 8.6-50 MG tablet, Take 1 tablet by mouth daily as needed for constipation., Disp: , Rfl:  .  Ubiquinol (QUNOL COQ10/UBIQUINOL/MEGA) 100 MG CAPS, Take 100 mg by mouth every evening. CoQ10, Qunol , Disp: , Rfl:  .  doxycycline (VIBRA-TABS) 100 MG tablet, Take 1 tablet (100 mg  total) by mouth 2 (two) times daily., Disp: 20 tablet, Rfl: 0  Social History   Tobacco Use  Smoking Status Never Smoker  Smokeless Tobacco Never Used    Allergies  Allergen Reactions  . Ultram [Tramadol] Other (See Comments)    Pt has seizure activity with this medication  . Lipitor [Atorvastatin] Other (See Comments)    Bone and muscle pain  . Lyrica [Pregabalin] Other (See Comments)    Weight gain, extremity swelling  . Reglan [Metoclopramide] Other (See Comments)    Tardive dyskinesia; paradoxical reaction not relieved by benadryl  . Nsaids Swelling    SWELLING REACTION UNSPECIFIED    Objective:  There were no vitals filed for this visit. There is no height or weight on file to calculate BMI. Constitutional Well developed. Well nourished.  Vascular Dorsalis pedis pulses palpable bilaterally. Posterior tibial pulses palpable bilaterally. Capillary refill normal to all digits.  No cyanosis or clubbing noted. Pedal hair growth normal.  Neurologic Normal speech. Oriented to person, place, and time. Epicritic sensation to light touch grossly present bilaterally.  Dermatologic Nails well groomed and normal in appearance. No open wounds. No skin lesions.  Orthopedic:  Right fifth digit blister formation without underlying wound noted.  Mild erythema/redness noted.  Does not probe to bone.  No other clinical signs of infection such as purulent drainage or  malodor noted.   Radiographs: Three views of skeletally mature adult bilateral foot: Right foot shows previous hardware noted appears to be intact.  No signs of loosening or dehiscence noted.  Severe Charcot breakdown noted at the midfoot with fusion of the talonavicular joint and subtalar joint.  Ankle mortise within normal limits to both feet.  Severe pes planovalgus deformity noted bilaterally.  No signs of osteomyelitic changes noted. Assessment:   1. Foot pain, bilateral   2. 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 Charcot foot due to diabetes mellitus ([place])   4. Infected blister of right foot, initial encounter    Plan:  Patient was evaluated and treated and all questions answered.  Right fifth digit blister formation secondary to Charcot brace with underlying Charcot foot and diabetes -I explained to the patient the etiology of blister formation various treatment options were discussed.  At this time we will continue to monitor the blister formation if it continues to progress or worsens I will have her follow-up with Liliane Channel for changes to the bracing. -Given the erythema present I believe patient will benefit from antibiotic therapy as well.  Doxycycline was sent to the pharmacy. -She will apply triple antibiotic and a Band-Aid to keep it covered for now. -I discussed with the patient that she is a high risk of losing the limb if the infection cannot be controlled and continues to progress.  Patient states understanding.  No follow-ups on file.

## 2019-10-29 ENCOUNTER — Ambulatory Visit
Admission: RE | Admit: 2019-10-29 | Discharge: 2019-10-29 | Disposition: A | Discharge status: Home / Self Care | Payer: Medicare Other | Source: Ambulatory Visit | Attending: Hematology | Admitting: Hematology

## 2019-10-29 ENCOUNTER — Other Ambulatory Visit: Payer: Self-pay

## 2019-10-29 DIAGNOSIS — Z170 Estrogen receptor positive status [ER+]: Secondary | ICD-10-CM

## 2019-10-29 DIAGNOSIS — R928 Other abnormal and inconclusive findings on diagnostic imaging of breast: Secondary | ICD-10-CM | POA: Diagnosis not present

## 2019-10-29 DIAGNOSIS — C50211 Malignant neoplasm of upper-inner quadrant of right female breast: Secondary | ICD-10-CM

## 2019-10-30 ENCOUNTER — Other Ambulatory Visit: Payer: Self-pay | Admitting: *Deleted

## 2019-10-30 NOTE — Patient Outreach (Signed)
[facility]) Care Management  10/30/2019  [name] 1941-01-26 [id number]   RN Health Coach attempted follow up outreach call to patient.  Patient was unavailable. Per voicemail message the mailbox is full. Plan: RN will call patient again within 30 days.  [place] Care Management [phone]

## 2019-11-02 DIAGNOSIS — L814 Other melanin hyperpigmentation: Secondary | ICD-10-CM | POA: Diagnosis not present

## 2019-11-02 DIAGNOSIS — C44729 Squamous cell carcinoma of skin of left lower limb, including hip: Secondary | ICD-10-CM | POA: Diagnosis not present

## 2019-11-02 DIAGNOSIS — L821 Other seborrheic keratosis: Secondary | ICD-10-CM | POA: Diagnosis not present

## 2019-11-02 DIAGNOSIS — L820 Inflamed seborrheic keratosis: Secondary | ICD-10-CM | POA: Diagnosis not present

## 2019-11-02 DIAGNOSIS — D485 Neoplasm of uncertain behavior of skin: Secondary | ICD-10-CM | POA: Diagnosis not present

## 2019-11-02 DIAGNOSIS — L578 Other skin changes due to chronic exposure to nonionizing radiation: Secondary | ICD-10-CM | POA: Diagnosis not present

## 2019-11-02 DIAGNOSIS — D225 Melanocytic nevi of trunk: Secondary | ICD-10-CM | POA: Diagnosis not present

## 2019-11-02 DIAGNOSIS — Z85820 Personal history of malignant melanoma of skin: Secondary | ICD-10-CM | POA: Diagnosis not present

## 2019-11-02 DIAGNOSIS — D1801 Hemangioma of skin and subcutaneous tissue: Secondary | ICD-10-CM | POA: Diagnosis not present

## 2019-11-02 DIAGNOSIS — L570 Actinic keratosis: Secondary | ICD-10-CM | POA: Diagnosis not present

## 2019-11-02 DIAGNOSIS — L905 Scar conditions and fibrosis of skin: Secondary | ICD-10-CM | POA: Diagnosis not present

## 2019-11-06 ENCOUNTER — Ambulatory Visit
Admission: RE | Admit: 2019-11-06 | Discharge: 2019-11-06 | Disposition: A | Discharge status: Home / Self Care | Payer: Medicare Other | Source: Ambulatory Visit | Attending: General Practice | Admitting: General Practice

## 2019-11-06 ENCOUNTER — Other Ambulatory Visit: Payer: Self-pay

## 2019-11-06 DIAGNOSIS — R410 Disorientation, unspecified: Secondary | ICD-10-CM | POA: Diagnosis not present

## 2019-11-06 DIAGNOSIS — R251 Tremor, unspecified: Secondary | ICD-10-CM | POA: Diagnosis not present

## 2019-11-06 DIAGNOSIS — R200 Anesthesia of skin: Secondary | ICD-10-CM | POA: Diagnosis not present

## 2019-11-06 DIAGNOSIS — R531 Weakness: Secondary | ICD-10-CM | POA: Diagnosis not present

## 2019-11-06 MED ORDER — GADOBENATE DIMEGLUMINE 529 MG/ML IV SOLN
17.0000 mL | Freq: Once | INTRAVENOUS | Status: AC | PRN
  Administered 2019-11-06: 17 mL via INTRAVENOUS

## 2019-11-07 DIAGNOSIS — Z79899 Other long term (current) drug therapy: Secondary | ICD-10-CM | POA: Diagnosis not present

## 2019-11-09 ENCOUNTER — Other Ambulatory Visit: Payer: Self-pay

## 2019-11-09 ENCOUNTER — Ambulatory Visit (INDEPENDENT_AMBULATORY_CARE_PROVIDER_SITE_OTHER): Payer: Medicare Other | Admitting: Podiatry

## 2019-11-09 DIAGNOSIS — R2681 Unsteadiness on feet: Secondary | ICD-10-CM | POA: Diagnosis not present

## 2019-11-09 DIAGNOSIS — S90821A Blister (nonthermal), right foot, initial encounter: Secondary | ICD-10-CM | POA: Diagnosis not present

## 2019-11-09 DIAGNOSIS — E11610 Type 2 diabetes mellitus with diabetic neuropathic arthropathy: Secondary | ICD-10-CM | POA: Diagnosis not present

## 2019-11-09 DIAGNOSIS — IMO0002 Reserved for concepts with insufficient information to code with codable children: Secondary | ICD-10-CM

## 2019-11-09 DIAGNOSIS — L089 Local infection of the skin and subcutaneous tissue, unspecified: Secondary | ICD-10-CM | POA: Diagnosis not present

## 2019-11-09 DIAGNOSIS — E1165 Type 2 diabetes mellitus with hyperglycemia: Secondary | ICD-10-CM

## 2019-11-13 ENCOUNTER — Encounter: Payer: Self-pay | Admitting: Podiatry

## 2019-11-13 NOTE — Progress Notes (Signed)
Subjective:  Patient ID: [name], female    DOB: [birth year],  MRN: [id number]  No chief complaint on file.   79 y.o. female presents with the above complaint.  Patient presents with bilateral Charcot deformity with a concern for blister ration to the right fifth digit.  Patient states she is doing well.  Her redness has gone down.  She was given antibiotics which is helped considerably.  She has been keeping it covered with triple biotic and a Band-Aid.  She denies any other acute complaints.   Review of Systems: Negative except as noted in the HPI. Denies N/V/F/Ch.  Past Medical History:  Diagnosis Date  . Anemia    have received iron infusions  . Anxiety   . Arthritis     osteoarthritis  . Breast cancer in female [facility])    Bilateral  . CAD S/P percutaneous coronary angioplasty 2007; March 2011   [facility] 3.0 mm 20 mm postdilated 3.6 mm in early mid LAD; status post ISR [place] PTCA and March 11 along with PCI of distal mid lesion with a 3.0 mm 12 mm MultiLink vision BMS; the proximal stent causes jailing of SP1 and SP2 with ostial 70-80% lesions  . Cancer (HCC)    Melanoma, Squamous cell Carcinoma  . Chronic diastolic CHF (congestive heart failure), NYHA class 2 ([place]) 02/28/2018  . Chronic kidney disease    stage 2   . Dementia ([place])    mild  . Diabetes mellitus type 2 with neurological manifestations ([place])   . Dyslipidemia, goal LDL below 70   . Full dentures   . Genetic testing 12/03/2016   Germline genetic testing was performed through [facility] Common Hereditary Cancers Panel + [facility] Melanoma Panel. This custom panel includes analysis of the following 51 genes: APC, ATM, AXIN2, BAP1, BARD1, BMPR1A, BRCA1, BRCA2, BRIP1, CDH1, CDK4, CDKN2A, CHEK2, CTNNA1, DICER1, EPCAM, GREM1, HOXB13, KIT, MEN1, MITF, MLH1, MSH2, MSH3, MSH6, MUTYH, NBN, NF1, NTHL1, PALB2, PDGFRA, PMS2, POLD1, POL  . GERD (gastroesophageal reflux disease)   . Headache(784.0)   . History of  hematuria    Followed by Dr. Gaynelle Arabian  . Hypertension, essential, benign   . Peripheral neuropathy   . Personal history of radiation therapy   . Stroke ([place])   . TIA (transient ischemic attack)    multiple in the past  . Wears glasses     Current Outpatient Medications:  .  bumetanide (BUMEX) 2 MG tablet, Take 1 tablet (2 mg total) by mouth 2 (two) times daily. (Patient taking differently: Take 2 mg by mouth 2 (two) times daily. 2 tablets in the morning and 1 at noon), Disp: 60 tablet, Rfl: 0 .  Calcium Carb-Cholecalciferol (CALCIUM 600+D3) 600-800 MG-UNIT TABS, Take 1 tablet by mouth daily., Disp: , Rfl:  .  clopidogrel (PLAVIX) 75 MG tablet, TAKE 1 TABLET DAILY, Disp: 90 tablet, Rfl: 3 .  COLCRYS 0.6 MG tablet, TAKE 1 TABLET DAILY AS NEEDED FOR GOUT FLARE., Disp: 90 tablet, Rfl: 3 .  Cranberry 500 MG TABS, Take 500 mg by mouth every evening., Disp: , Rfl:  .  diazepam (VALIUM) 5 MG tablet, Take 1 tablet (5 mg total) by mouth 2 (two) times daily as needed for anxiety., Disp: 10 tablet, Rfl: 0 .  doxycycline (VIBRA-TABS) 100 MG tablet, Take 1 tablet (100 mg total) by mouth 2 (two) times daily., Disp: 20 tablet, Rfl: 0 .  exemestane (AROMASIN) 25 MG tablet, TAKE 1 TABLET DAILY AFTER BREAKFAST, Disp: 90 tablet,  Rfl: 3 .  febuxostat (ULORIC) 40 MG tablet, TAKE 1 TABLET DAILY, Disp: 90 tablet, Rfl: 3 .  gabapentin (NEURONTIN) 300 MG capsule, Take 300-600 mg by mouth daily as needed (nerve pain)., Disp: , Rfl:  .  gabapentin (NEURONTIN) 300 MG capsule, Take 1 capsule (300 mg total) by mouth 2 (two) times daily. Take 1 during the day if needed and 2 at night, Disp: 270 capsule, Rfl: 1 .  Insulin Lispro (HUMALOG KWIKPEN Optima), Inject into the skin. 100-150= 2 units, 151-200= 3 units and 201-250= 4 units, Disp: , Rfl:  .  insulin NPH Human (HUMULIN N,NOVOLIN N) 100 UNIT/ML injection, Inject 20 Units into the skin See admin instructions. Inject 20 units in the morning & inject 25 units after supper.,  Disp: , Rfl:  .  insulin NPH-regular Human (70-30) 100 UNIT/ML injection, Inject 20 Units into the skin 2 (two) times daily with a meal., Disp: , Rfl:  .  Krill Oil 500 MG CAPS, Take 500 mg by mouth daily., Disp: , Rfl:  .  Methylfol-Methylcob-Acetylcyst (CEREFOLIN NAC) 6-2-600 MG TABS, TAKE 1 TABLET BY MOUTH EVERY DAY, Disp: 90 tablet, Rfl: 3 .  metolazone (ZAROXOLYN) 2.5 MG tablet, TAKE 1 TABLET DAILY 30 MINUTES BEFORE THE MORNING DOSE OF BUMEX AS DIRECTED, Disp: 90 tablet, Rfl: 1 .  metoprolol succinate (TOPROL XL) 25 MG 24 hr tablet, Take 0.5 tablets (12.5 mg total) by mouth at bedtime., Disp: 45 tablet, Rfl: 3 .  mirtazapine (REMERON) 15 MG tablet, Take 15 mg by mouth at bedtime. Taking 1 1/2 tablet at bedtime, Disp: , Rfl:  .  nitroGLYCERIN (NITROSTAT) 0.4 MG SL tablet, Place 0.4 mg under the tongue every 5 (five) minutes as needed for chest pain., Disp: , Rfl:  .  oxyCODONE-acetaminophen (PERCOCET/ROXICET) 5-325 MG tablet, Take 1 tablet by mouth every 6 (six) hours as needed for severe pain., Disp: 28 tablet, Rfl: 0 .  pantoprazole (PROTONIX) 40 MG tablet, TAKE 1 TABLET DAILY (CONTACT OFFICE FOR ADDITIONAL REFILLS, FINAL ATTEMPT) (Patient taking differently: Take 40 mg by mouth daily before breakfast. ), Disp: 90 tablet, Rfl: 0 .  potassium chloride (KLOR-CON) 20 MEQ packet, Take 20 mEq by mouth 2 (two) times daily. 2 tablets in the morning and 1 at bedtime, Disp: , Rfl:  .  pramipexole (MIRAPEX) 0.25 MG tablet, TAKE 1 TO 2 TABLETS AS INSTRUCTED, 1 TABLET AT 4:00 P.M. AND 2 TABLETS AT BEDTIME, Disp: 270 tablet, Rfl: 1 .  rosuvastatin (CRESTOR) 5 MG tablet, Take 5 mg by mouth daily., Disp: , Rfl:  .  sennosides-docusate sodium (SENOKOT-S) 8.6-50 MG tablet, Take 1 tablet by mouth daily as needed for constipation., Disp: , Rfl:  .  Ubiquinol (QUNOL COQ10/UBIQUINOL/MEGA) 100 MG CAPS, Take 100 mg by mouth every evening. CoQ10, Qunol , Disp: , Rfl:   Social History   Tobacco Use  Smoking Status  Never Smoker  Smokeless Tobacco Never Used    Allergies  Allergen Reactions  . Ultram [Tramadol] Other (See Comments)    Pt has seizure activity with this medication  . Lipitor [Atorvastatin] Other (See Comments)    Bone and muscle pain  . Lyrica [Pregabalin] Other (See Comments)    Weight gain, extremity swelling  . Reglan [Metoclopramide] Other (See Comments)    Tardive dyskinesia; paradoxical reaction not relieved by benadryl  . Nsaids Swelling    SWELLING REACTION UNSPECIFIED    Objective:  There were no vitals filed for this visit. There is no height or weight  on file to calculate BMI. Constitutional Well developed. Well nourished.  Vascular Dorsalis pedis pulses palpable bilaterally. Posterior tibial pulses palpable bilaterally. Capillary refill normal to all digits.  No cyanosis or clubbing noted. Pedal hair growth normal.  Neurologic Normal speech. Oriented to person, place, and time. Epicritic sensation to light touch grossly present bilaterally.  Dermatologic Nails well groomed and normal in appearance. No open wounds. No skin lesions.  Orthopedic:  Right fifth digit blister formation without underlying wound noted.  No erythema/redness noted.  Does not probe to bone.  No other clinical signs of infection such as purulent drainage or malodor noted.   Radiographs: Three views of skeletally mature adult bilateral foot: Right foot shows previous hardware noted appears to be intact.  No signs of loosening or dehiscence noted.  Severe Charcot breakdown noted at the midfoot with fusion of the talonavicular joint and subtalar joint.  Ankle mortise within normal limits to both feet.  Severe pes planovalgus deformity noted bilaterally.  No signs of osteomyelitic changes noted. Assessment:   1. 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 ([place])   2. Charcot foot due to diabetes mellitus ([place])   3. Infected blister of right foot, initial encounter   4. Gait  instability    Plan:  Patient was evaluated and treated and all questions answered.  Right fifth digit blister formation secondary to Charcot brace with underlying Charcot foot and diabetes -Clinically the blister is improving considerably.  Patient has been keeping a Band-Aid.  Given the underlying Charcot deformity I believe patient will base for and benefit from bracing to allow her to ambulate properly.  I also believe she will need to offload the right lateral portion of the foot/toe to make sure that there is no future blister formation. -She will be scheduled to see Liliane Channel for stability bracing to prevent falls/instability.  Return for f/u with [facility] for bracing.

## 2019-11-19 ENCOUNTER — Telehealth: Payer: Self-pay | Admitting: Cardiology

## 2019-11-19 DIAGNOSIS — L244 Irritant contact dermatitis due to drugs in contact with skin: Secondary | ICD-10-CM | POA: Diagnosis not present

## 2019-11-19 DIAGNOSIS — D0472 Carcinoma in situ of skin of left lower limb, including hip: Secondary | ICD-10-CM | POA: Diagnosis not present

## 2019-11-19 NOTE — Telephone Encounter (Signed)
*  STAT* If patient is at the pharmacy, call can be transferred to refill team.   1. Which medications need to be refilled? (please list name of each medication and dose if known)  metoprolol succinate (TOPROL XL) 25 MG 24 hr tablet  2. Which pharmacy/location (including street and city if local pharmacy) is medication to be sent to? [facility], [place]  3. Do they need a 30 day or 90 day supply? 90 day supply   Almost out of medication.

## 2019-11-20 ENCOUNTER — Other Ambulatory Visit: Payer: Self-pay

## 2019-11-20 ENCOUNTER — Other Ambulatory Visit: Payer: Self-pay | Admitting: *Deleted

## 2019-11-20 MED ORDER — METOPROLOL SUCCINATE ER 25 MG PO TB24: 12.5000 mg | ORAL_TABLET | Freq: Every day | ORAL | 3 refills | Status: DC

## 2019-11-21 ENCOUNTER — Other Ambulatory Visit: Payer: Self-pay

## 2019-11-21 ENCOUNTER — Ambulatory Visit: Payer: Medicare Other | Admitting: Orthotics

## 2019-11-21 DIAGNOSIS — M79671 Pain in right foot: Secondary | ICD-10-CM

## 2019-11-21 DIAGNOSIS — R2681 Unsteadiness on feet: Secondary | ICD-10-CM

## 2019-11-21 DIAGNOSIS — M76821 Posterior tibial tendinitis, right leg: Secondary | ICD-10-CM

## 2019-11-21 DIAGNOSIS — E11610 Type 2 diabetes mellitus with diabetic neuropathic arthropathy: Secondary | ICD-10-CM

## 2019-11-21 DIAGNOSIS — M79672 Pain in left foot: Secondary | ICD-10-CM

## 2019-11-21 DIAGNOSIS — IMO0002 Reserved for concepts with insufficient information to code with codable children: Secondary | ICD-10-CM

## 2019-11-21 DIAGNOSIS — E1165 Type 2 diabetes mellitus with hyperglycemia: Secondary | ICD-10-CM

## 2019-11-21 NOTE — Progress Notes (Signed)
Patient has received in the past year b/l braces (MAFO) from [employer].  She isn't eligble to get any type of brace for five years.  I really believe she would benefit from custom made shoes that would have a MAFO channel incorporated into the heel.  However, I do need to see the brace (it is currrently at [place] getting some modifications done) in order to see how much RF valgus is be controlled by the same.   (she has moderate to severe medial talus shift L > R)  Advised patient to make appointment with me week 2 September for consultation and possible custom shoe casting.

## 2019-11-26 ENCOUNTER — Emergency Department (HOSPITAL_COMMUNITY): Payer: Medicare Other

## 2019-11-26 ENCOUNTER — Emergency Department (HOSPITAL_COMMUNITY)
Admission: EM | Admit: 2019-11-26 | Discharge: 2019-11-26 | Disposition: A | Discharge status: Home / Self Care | Payer: Medicare Other | Attending: Emergency Medicine | Admitting: Emergency Medicine

## 2019-11-26 ENCOUNTER — Encounter (HOSPITAL_COMMUNITY): Payer: Self-pay | Admitting: *Deleted

## 2019-11-26 ENCOUNTER — Other Ambulatory Visit: Payer: Self-pay

## 2019-11-26 DIAGNOSIS — Z955 Presence of coronary angioplasty implant and graft: Secondary | ICD-10-CM | POA: Insufficient documentation

## 2019-11-26 DIAGNOSIS — S0093XA Contusion of unspecified part of head, initial encounter: Secondary | ICD-10-CM | POA: Diagnosis not present

## 2019-11-26 DIAGNOSIS — E1122 Type 2 diabetes mellitus with diabetic chronic kidney disease: Secondary | ICD-10-CM | POA: Insufficient documentation

## 2019-11-26 DIAGNOSIS — S0990XA Unspecified injury of head, initial encounter: Secondary | ICD-10-CM | POA: Diagnosis not present

## 2019-11-26 DIAGNOSIS — Z794 Long term (current) use of insulin: Secondary | ICD-10-CM | POA: Insufficient documentation

## 2019-11-26 DIAGNOSIS — Z96632 Presence of left artificial wrist joint: Secondary | ICD-10-CM | POA: Diagnosis not present

## 2019-11-26 DIAGNOSIS — E1140 Type 2 diabetes mellitus with diabetic neuropathy, unspecified: Secondary | ICD-10-CM | POA: Insufficient documentation

## 2019-11-26 DIAGNOSIS — Z853 Personal history of malignant neoplasm of breast: Secondary | ICD-10-CM | POA: Diagnosis not present

## 2019-11-26 DIAGNOSIS — I5032 Chronic diastolic (congestive) heart failure: Secondary | ICD-10-CM | POA: Diagnosis not present

## 2019-11-26 DIAGNOSIS — R9082 White matter disease, unspecified: Secondary | ICD-10-CM | POA: Diagnosis not present

## 2019-11-26 DIAGNOSIS — W19XXXA Unspecified fall, initial encounter: Secondary | ICD-10-CM | POA: Diagnosis not present

## 2019-11-26 DIAGNOSIS — I2510 Atherosclerotic heart disease of native coronary artery without angina pectoris: Secondary | ICD-10-CM | POA: Diagnosis not present

## 2019-11-26 DIAGNOSIS — R0902 Hypoxemia: Secondary | ICD-10-CM | POA: Diagnosis not present

## 2019-11-26 DIAGNOSIS — S41122A Laceration with foreign body of left upper arm, initial encounter: Secondary | ICD-10-CM | POA: Diagnosis not present

## 2019-11-26 DIAGNOSIS — N184 Chronic kidney disease, stage 4 (severe): Secondary | ICD-10-CM | POA: Insufficient documentation

## 2019-11-26 DIAGNOSIS — Z79899 Other long term (current) drug therapy: Secondary | ICD-10-CM | POA: Diagnosis not present

## 2019-11-26 DIAGNOSIS — Y939 Activity, unspecified: Secondary | ICD-10-CM | POA: Insufficient documentation

## 2019-11-26 DIAGNOSIS — Y9259 Other trade areas as the place of occurrence of the external cause: Secondary | ICD-10-CM | POA: Diagnosis not present

## 2019-11-26 DIAGNOSIS — Z8673 Personal history of transient ischemic attack (TIA), and cerebral infarction without residual deficits: Secondary | ICD-10-CM | POA: Insufficient documentation

## 2019-11-26 DIAGNOSIS — Y999 Unspecified external cause status: Secondary | ICD-10-CM | POA: Diagnosis not present

## 2019-11-26 DIAGNOSIS — R519 Headache, unspecified: Secondary | ICD-10-CM | POA: Diagnosis not present

## 2019-11-26 DIAGNOSIS — M7989 Other specified soft tissue disorders: Secondary | ICD-10-CM | POA: Diagnosis not present

## 2019-11-26 DIAGNOSIS — I10 Essential (primary) hypertension: Secondary | ICD-10-CM | POA: Diagnosis not present

## 2019-11-26 DIAGNOSIS — R52 Pain, unspecified: Secondary | ICD-10-CM | POA: Diagnosis not present

## 2019-11-26 DIAGNOSIS — Z043 Encounter for examination and observation following other accident: Secondary | ICD-10-CM | POA: Diagnosis not present

## 2019-11-26 DIAGNOSIS — W208XXA Other cause of strike by thrown, projected or falling object, initial encounter: Secondary | ICD-10-CM | POA: Diagnosis not present

## 2019-11-26 DIAGNOSIS — I130 Hypertensive heart and chronic kidney disease with heart failure and stage 1 through stage 4 chronic kidney disease, or unspecified chronic kidney disease: Secondary | ICD-10-CM | POA: Diagnosis not present

## 2019-11-26 DIAGNOSIS — R58 Hemorrhage, not elsewhere classified: Secondary | ICD-10-CM | POA: Diagnosis not present

## 2019-11-26 DIAGNOSIS — Z96652 Presence of left artificial knee joint: Secondary | ICD-10-CM | POA: Insufficient documentation

## 2019-11-26 DIAGNOSIS — Z23 Encounter for immunization: Secondary | ICD-10-CM | POA: Insufficient documentation

## 2019-11-26 DIAGNOSIS — F0390 Unspecified dementia without behavioral disturbance: Secondary | ICD-10-CM | POA: Insufficient documentation

## 2019-11-26 DIAGNOSIS — S41112A Laceration without foreign body of left upper arm, initial encounter: Secondary | ICD-10-CM | POA: Diagnosis not present

## 2019-11-26 LAB — CBC WITH DIFFERENTIAL/PLATELET
Abs Immature Granulocytes: 0.04 10*3/uL (ref 0.00–0.07)
Basophils Absolute: 0.1 10*3/uL (ref 0.0–0.1)
Basophils Relative: 1 %
Eosinophils Absolute: 0.2 10*3/uL (ref 0.0–0.5)
Eosinophils Relative: 2 %
HCT: 39.9 % (ref 36.0–46.0)
Hemoglobin: 12.5 g/dL (ref 12.0–15.0)
Immature Granulocytes: 0 %
Lymphocytes Relative: 12 %
Lymphs Abs: 1.3 10*3/uL (ref 0.7–4.0)
MCH: 29.3 pg (ref 26.0–34.0)
MCHC: 31.3 g/dL (ref 30.0–36.0)
MCV: 93.4 fL (ref 80.0–100.0)
Monocytes Absolute: 0.7 10*3/uL (ref 0.1–1.0)
Monocytes Relative: 7 %
Neutro Abs: 8.7 10*3/uL — ABNORMAL HIGH (ref 1.7–7.7)
Neutrophils Relative %: 78 %
Platelets: 230 10*3/uL (ref 150–400)
RBC: 4.27 MIL/uL (ref 3.87–5.11)
RDW: 15.4 % (ref 11.5–15.5)
WBC: 11 10*3/uL — ABNORMAL HIGH (ref 4.0–10.5)
nRBC: 0 % (ref 0.0–0.2)

## 2019-11-26 MED ORDER — TETANUS-DIPHTH-ACELL PERTUSSIS 5-2.5-18.5 LF-MCG/0.5 IM SUSP
0.5000 mL | Freq: Once | INTRAMUSCULAR | Status: AC
  Administered 2019-11-26: 0.5 mL via INTRAMUSCULAR
  Filled 2019-11-26: qty 0.5

## 2019-11-26 MED ORDER — LIDOCAINE-EPINEPHRINE 1 %-1:100000 IJ SOLN
20.0000 mL | Freq: Once | INTRAMUSCULAR | Status: AC
  Administered 2019-11-26: 20 mL via INTRADERMAL

## 2019-11-26 MED ORDER — CEPHALEXIN 500 MG PO CAPS: 500.0000 mg | ORAL_CAPSULE | Freq: Two times a day (BID) | ORAL | 0 refills | Status: AC

## 2019-11-26 NOTE — ED Notes (Signed)
Patient verbalizes understanding of discharge instructions . Opportunity for questions and answers were provided . Armband removed by staff ,Pt discharged from ED. W/C  offered at D/C  and Declined W/C at D/C and was escorted to lobby by RN.  

## 2019-11-26 NOTE — Progress Notes (Signed)
Orthopedic Tech Progress Note Patient Details:  [name] 1940-09-30 [id number]  Ortho Devices Type of Ortho Device: Arm sling Ortho Device/Splint Location: LUE Ortho Device/Splint Interventions: Ordered, Application, Adjustment   Post Interventions Instructions Provided: Care of device, Adjustment of device, Poper ambulation with device   [name] 11/26/2019, 3:19 PM

## 2019-11-26 NOTE — ED Triage Notes (Signed)
PT fell while out  To eat at [employer]. Pt reports hitting  Head Pt reports she did not have LOC. Pt has lac to Lt posterior forearm . Pt's lac bleeding from lac. Pt currently taking Plavix Bleeding controlled with dsy and staples appled by EDP. Pt reports a HA . Pt is A/O x 4.

## 2019-11-26 NOTE — ED Notes (Signed)
DSY to Lt upper posterior arm dry and intact

## 2019-11-26 NOTE — ED Provider Notes (Signed)
[facility] EMERGENCY DEPARTMENT Provider Note   CSN: [id number] Arrival date & time:        History Chief Complaint  Patient presents with  . Fall    Maryetta Camri Molloy is a 79 y.o. female.  Patient fell prior to arrival at a [employer] landing on her left arm.  Has laceration to the left tricep area.  She is on Plavix.  Bleeding has been difficult to stop and tourniquet was placed by EMS.  Has pressure dressing in place.  Does not think that she hit her head but she is not sure.  Not having much for extremity pain except for discomfort at her laceration site.    The history is provided by the patient.  Fall This is a new problem. The current episode started 1 to 2 hours ago. The problem has been resolved. Pertinent negatives include no chest pain, no abdominal pain, no headaches and no shortness of breath. Nothing aggravates the symptoms. Nothing relieves the symptoms. She has tried nothing for the symptoms. The treatment provided no relief.       Past Medical History:  Diagnosis Date  . Anemia    have received iron infusions  . Anxiety   . Arthritis     osteoarthritis  . Breast cancer in female [facility])    Bilateral  . CAD S/P percutaneous coronary angioplasty 2007; March 2011   [facility] 3.0 mm 20 mm postdilated 3.6 mm in early mid LAD; status post ISR Cutting Balloon PTCA and March 11 along with PCI of distal mid lesion with a 3.0 mm 12 mm MultiLink vision BMS; the proximal stent causes jailing of SP1 and SP2 with ostial 70-80% lesions  . Cancer (HCC)    Melanoma, Squamous cell Carcinoma  . Chronic diastolic CHF (congestive heart failure), NYHA class 2 (HCC) 02/28/2018  . Chronic kidney disease    stage 2   . Dementia (HCC)    mild  . Diabetes mellitus type 2 with neurological manifestations (HCC)   . Dyslipidemia, goal LDL below 70   . Full dentures   . Genetic testing 12/03/2016   Germline genetic testing was performed through [employer] Common  Hereditary Cancers Panel + [employer] Melanoma Panel. This custom panel includes analysis of the following 51 genes: APC, ATM, AXIN2, BAP1, BARD1, BMPR1A, BRCA1, BRCA2, BRIP1, CDH1, CDK4, CDKN2A, CHEK2, CTNNA1, DICER1, EPCAM, GREM1, HOXB13, KIT, MEN1, MITF, MLH1, MSH2, MSH3, MSH6, MUTYH, NBN, NF1, NTHL1, PALB2, PDGFRA, PMS2, POLD1, POL  . GERD (gastroesophageal reflux disease)   . Headache(784.0)   . History of hematuria    Followed by Dr. Patsi Sears  . Hypertension, essential, benign   . Peripheral neuropathy   . Personal history of radiation therapy   . Stroke (HCC)   . TIA (transient ischemic attack)    multiple in the past  . Wears glasses     Patient Active Problem List   Diagnosis Date Noted  . Somnolence, daytime 04/06/2018  . Chronic diastolic CHF (congestive heart failure), NYHA class 2 (HCC) 02/28/2018  . SOB (shortness of breath)   . Hypotension   . Elevated troponin 02/25/2018  . Hypomagnesemia 02/25/2018  . Hypokalemia 02/24/2018  . Urinary retention 02/24/2018  . Sepsis (HCC) 02/24/2018  . Posterior tibial tendinitis of right leg 02/22/2018  . Posterior tibial tendinitis, right leg   . Right atrial mass   . Genetic testing 12/03/2016  . 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 Malignant neoplasm of upper-outer quadrant of left breast  in female, estrogen receptor positive ([place]) 11/19/2016  . Chronic idiopathic gout involving toe of left foot without tophus 11/15/2016  . Malignant 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, estrogen receptor positive ([place]) 11/09/2016  . Tendon tear, ankle, left, sequela   . Traumatic rupture of left anterior tibial tendon 04/20/2016  . Acute kidney injury superimposed on CKD ([place])   . 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 ([place])   . Headache, migraine   . Unexplained weight loss 11/17/2015  . 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 Restless legs syndrome 07/24/2015  .  Mild cognitive impairment 01/08/2015  . Abnormality of gait 01/08/2015  . Dizziness 12/19/2014  . Preoperative cardiovascular examination 12/11/2013  . GERD (gastroesophageal reflux disease) 10/06/2013  . Syncope 10/05/2013  . Anxiety 10/28/2012    Class: Acute  . Bilateral lower extremity edema 10/28/2012  . CAD S/P percutaneous coronary angioplasty   . Essential hypertension   . Dyslipidemia, goal LDL below 70     Past Surgical History:  Procedure Laterality Date  . ABDOMINAL HYSTERECTOMY    . ANKLE FUSION Right 02/22/2018   Procedure: RIGHT SUBTALAR AND TALONAVICULAR FUSION;  Surgeon: Newt Minion, MD;  Location: [place];  Service: Orthopedics;  Laterality: Right;  . APPENDECTOMY    . BLADDER SUSPENSION    . BREAST LUMPECTOMY Bilateral 2018  . BREAST LUMPECTOMY WITH RADIOACTIVE SEED AND SENTINEL LYMPH NODE BIOPSY Bilateral 12/14/2016   Procedure: BILATERAL BREAST LUMPECTOMY WITH BILATERAL  RADIOACTIVE SEED AND BILATERAL AXILLARY  SENTINEL LYMPH NODE BIOPSY ERAS PATHWAY;  Surgeon: Alphonsa Overall, MD;  Location: [place];  Service: General;  Laterality: Bilateral;  PECTORAL BLOCK  . CARDIAC CATHETERIZATION  02/24/2006   85% stenosis in the proximal portion of LAD-arrangements made for PCI on 02/25/2006  . CARDIAC CATHETERIZATION  02/25/2006   80% LAD lesion stented with a 3x53mm Liberte stent resulting in reduction of 80% lesion to 0% residual  . CARDIAC CATHETERIZATION  04/26/2006   Medical management  . CARDIAC CATHETERIZATION  06/24/2009   60-70% re-stenosis in the proximal LAD. A 3.25x15 cutting balloon, 3 inflations - 14atm-38sec, 13atm-39sec, and 12atm-40sec reduced to less than 10%. 60% stenosis of the mid/distal LAD stented with a 3x72mm Multilink stent.  Marland Kitchen CARDIAC CATHETERIZATION  07/09/2009   Medical management  . CAROTID DOPPLER  06/24/2009   40-59% R ICA stenosis. No significant ICA stenosis noted  . CHOLECYSTECTOMY    . DILATION AND CURETTAGE OF UTERUS    . JOINT  REPLACEMENT Left   . LESION REMOVAL Right 03/11/2015   Procedure:  EXCISION OF RIGHT PRE TIBIAL LESION;  Surgeon: Judeth Horn, MD;  Location: [place];  Service: General;  Laterality: Right;  . MASS EXCISION Left 06/10/2014   Procedure: EXCISION LEFT LOWER LEG LESION;  Surgeon: Doreen Salvage, MD;  Location: [place];  Service: General;  Laterality: Left;  Marland Kitchen MASS EXCISION Left 09/05/2015   Procedure: EXCISION OF LEFT FOREARM  MASS;  Surgeon: Judeth Horn, MD;  Location: [place];  Service: General;  Laterality: Left;  . NM MYOVIEW LTD  11/16/2011   5 beats a PVC during recovery.  No skin or infarction.  Reached 5 METs, EKG negative for ischemia   . NM MYOVIEW LTD  10/2011   No ischemia or infarction.  Normal EF.  LOW RISK. (Short bursts of 5 beats NSVT during recovery otherwise normal)  . SHOULDER ARTHROSCOPY  10/15  .  SHOULDER ARTHROSCOPY  1995   left  . TEE WITHOUT CARDIOVERSION N/A 08/03/2017   Procedure: TRANSESOPHAGEAL ECHOCARDIOGRAM (TEE);  Surgeon: Skeet Latch, MD;  Location: [place];  Service: Cardiovascular;; EF 60-65% with no wall motion normalities.  No atrial thrombus noted.  Lightly findings consistent with a lipomatous hypertrophy of the atrial septum.   . TENDON REPAIR Left 05/12/2016   Procedure: Left Anterior Tibial Tendon Reconstruction;  Surgeon: Newt Minion, MD;  Location: [place];  Service: Orthopedics;  Laterality: Left;  . TOTAL KNEE ARTHROPLASTY  2005   left  . TRANSESOPHAGEAL ECHOCARDIOGRAM  08/03/2016   : EF 60-65% no wall motion normalities.  No atrial thrombus.  Lipomatous hypertrophy of the atrial septum.  No mass noted.  . TRANSTHORACIC ECHOCARDIOGRAM  10/2015   EF 55-60 %. Mild LVH. Mild pulmonary hypertension. Normal valves.  . TRANSTHORACIC ECHOCARDIOGRAM  07/2017   Vigorous LV function.  EF 65-70%.  Mild diastolic dysfunction with no RWMA. GR 1 DD.  Mild aortic sclerosis/no stenosis.  Questionable right atrial  mass discounted with TEE)   . WRIST ARTHROPLASTY  2010   cancer lt wrist     OB History   No obstetric history on file.     Family History  Problem Relation Age of Onset  . Diabetes Mother   . Hypertension Mother   . Kidney disease Mother   . Hypertension Father   . Emphysema Father   . Heart attack Father   . Heart disease Father   . Arthritis Sister   . Diabetes Sister   . Hypertension Sister   . Breast cancer Sister 62       treated with mastectomy  . Cervical cancer Sister 53  . Hypertension Brother   . Hyperlipidemia Brother   . Diabetes Brother   . Stroke Brother   . Diabetes Sister   . Hypertension Sister   . Stomach cancer Sister 33  . Hypertension Brother   . ALS Brother        d.62s  . Breast cancer Daughter 48       triple negative treated with lumpectomy, chemo, radiation  . Heart attack Daughter 39       d.45  . COPD Daughter   . Cancer Paternal Aunt        unspecified type  . Cancer Paternal Uncle        unspecified type  . Cancer Paternal Uncle        unspecified type    Social History   Tobacco Use  . Smoking status: Never Smoker  . Smokeless tobacco: Never Used  Vaping Use  . Vaping Use: Never used  Substance Use Topics  . Alcohol use: No  . Drug use: No    Home Medications Prior to Admission medications   Medication Sig Start Date End Date Taking? Authorizing Provider  bumetanide (BUMEX) 2 MG tablet Take 1 tablet (2 mg total) by mouth 2 (two) times daily. Patient taking differently: Take 2 mg by mouth 2 (two) times daily. 2 tablets in the morning and 1 at noon 03/02/18   Dessa Phi, DO  Calcium Carb-Cholecalciferol (CALCIUM 600+D3) 600-800 MG-UNIT TABS Take 1 tablet by mouth daily.    [provider]  cephALEXin (KEFLEX) 500 MG capsule Take 1 capsule (500 mg total) by mouth 2 (two) times daily for 7 days. 11/26/19 12/03/19  Lennice Sites, DO  clopidogrel (PLAVIX) 75 MG tablet TAKE 1 TABLET DAILY 04/15/19   Leonie Man,  MD  COLCRYS 0.6 MG tablet TAKE 1 TABLET DAILY AS NEEDED FOR GOUT FLARE. 12/27/18   Newt Minion, MD  Cranberry 500 MG TABS Take 500 mg by mouth every evening.    [provider]  diazepam (VALIUM) 5 MG tablet Take 1 tablet (5 mg total) by mouth 2 (two) times daily as needed for anxiety. 03/02/18   Dessa Phi, DO  doxycycline (VIBRA-TABS) 100 MG tablet Take 1 tablet (100 mg total) by mouth 2 (two) times daily. 10/19/19   Felipa Furnace, DPM  exemestane (AROMASIN) 25 MG tablet TAKE 1 TABLET DAILY AFTER BREAKFAST 03/12/19   Truitt Merle, MD  febuxostat (ULORIC) 40 MG tablet TAKE 1 TABLET DAILY 10/02/19   Newt Minion, MD  gabapentin (NEURONTIN) 300 MG capsule Take 300-600 mg by mouth daily as needed (nerve pain).    [provider]  gabapentin (NEURONTIN) 300 MG capsule Take 1 capsule (300 mg total) by mouth 2 (two) times daily. Take 1 during the day if needed and 2 at night 04/06/18   Dennie Bible, NP  Insulin Lispro (HUMALOG KWIKPEN [place]) Inject into the skin. 100-150= 2 units, 151-200= 3 units and 201-250= 4 units    [provider]  insulin NPH Human (HUMULIN N,NOVOLIN N) 100 UNIT/ML injection Inject 20 Units into the skin See admin instructions. Inject 20 units in the morning & inject 25 units after supper.    [provider]  insulin NPH-regular Human (70-30) 100 UNIT/ML injection Inject 20 Units into the skin 2 (two) times daily with a meal.    [provider]  Javier Docker Oil 500 MG CAPS Take 500 mg by mouth daily.    [provider]  Methylfol-Methylcob-Acetylcyst (CEREFOLIN NAC) 6-2-600 MG TABS TAKE 1 TABLET BY MOUTH EVERY DAY 06/07/19   Garvin Fila, MD  metolazone (ZAROXOLYN) 2.5 MG tablet TAKE 1 TABLET DAILY 30 MINUTES BEFORE THE MORNING DOSE OF BUMEX AS DIRECTED 08/09/18   Leonie Man, MD  metoprolol succinate (TOPROL XL) 25 MG 24 hr tablet Take 0.5 tablets (12.5 mg total) by mouth at bedtime. 11/20/19   Leonie Man, MD    mirtazapine (REMERON) 15 MG tablet Take 15 mg by mouth at bedtime. Taking 1 1/2 tablet at bedtime    [provider]  nitroGLYCERIN (NITROSTAT) 0.4 MG SL tablet Place 0.4 mg under the tongue every 5 (five) minutes as needed for chest pain.    [provider]  oxyCODONE-acetaminophen (PERCOCET/ROXICET) 5-325 MG tablet Take 1 tablet by mouth every 6 (six) hours as needed for severe pain. 07/30/19   Persons, Bevely Palmer, PA  pantoprazole (PROTONIX) 40 MG tablet TAKE 1 TABLET DAILY (CONTACT OFFICE FOR ADDITIONAL REFILLS, FINAL ATTEMPT) Patient taking differently: Take 40 mg by mouth daily before breakfast.  02/02/16   Leonie Man, MD  potassium chloride (KLOR-CON) 20 MEQ packet Take 20 mEq by mouth 2 (two) times daily. 2 tablets in the morning and 1 at bedtime    [provider]  pramipexole (MIRAPEX) 0.25 MG tablet TAKE 1 TO 2 TABLETS AS INSTRUCTED, 1 TABLET AT 4:00 P.M. AND 2 TABLETS AT BEDTIME 09/27/19   Frann Rider, NP  rosuvastatin (CRESTOR) 5 MG tablet Take 5 mg by mouth daily.    [provider]  sennosides-docusate sodium (SENOKOT-S) 8.6-50 MG tablet Take 1 tablet by mouth daily as needed for constipation.    [provider]  Ubiquinol (QUNOL COQ10/UBIQUINOL/MEGA) 100 MG CAPS Take 100 mg by mouth every evening.  CoQ10, Qunol     [provider]    Allergies    Ultram [tramadol], Lipitor [atorvastatin], Lyrica [pregabalin], Reglan [metoclopramide], and Nsaids  Review of Systems   Review of Systems  Constitutional: Negative for chills and fever.  HENT: Negative for ear pain and sore throat.   Eyes: Negative for pain and visual disturbance.  Respiratory: Negative for cough and shortness of breath.   Cardiovascular: Negative for chest pain and palpitations.  Gastrointestinal: Negative for abdominal pain and vomiting.  Genitourinary: Negative for dysuria and hematuria.  Musculoskeletal: Negative for arthralgias and back pain.  Skin:  Positive for wound. Negative for color change and rash.  Neurological: Negative for seizures, syncope and headaches.  All other systems reviewed and are negative.   Physical Exam Updated Vital Signs BP (!) 146/92 (BP Location: Right Arm)   Pulse 97   Temp 98.3 F (36.8 C) (Oral)   Resp 15   SpO2 99%   Physical Exam Vitals and nursing note reviewed.  Constitutional:      General: She is not in acute distress.    Appearance: She is well-developed. She is not ill-appearing.  HENT:     Head: Normocephalic and atraumatic.     Nose: Nose normal.     Mouth/Throat:     Mouth: Mucous membranes are moist.  Eyes:     Extraocular Movements: Extraocular movements intact.     Conjunctiva/sclera: Conjunctivae normal.     Pupils: Pupils are equal, round, and reactive to light.  Cardiovascular:     Rate and Rhythm: Normal rate and regular rhythm.     Pulses: Normal pulses.     Heart sounds: Normal heart sounds. No murmur heard.   Pulmonary:     Effort: Pulmonary effort is normal. No respiratory distress.     Breath sounds: Normal breath sounds.  Abdominal:     Palpations: Abdomen is soft.     Tenderness: There is no abdominal tenderness.  Musculoskeletal:     Cervical back: Neck supple.  Skin:    General: Skin is warm and dry.     Capillary Refill: Capillary refill takes less than 2 seconds.     Comments: About 8 to 10 cm complex laceration to the left triceps area with steady venous bleeding, adipose tissue mildly exposed  Neurological:     General: No focal deficit present.     Mental Status: She is alert and oriented to person, place, and time.     Cranial Nerves: No cranial nerve deficit.     Sensory: No sensory deficit.     Motor: No weakness.     Comments: 5+ out of 5 strength throughout, normal sensation, no drift, normal speech     ED Results / Procedures / Treatments   Labs (all labs ordered are listed, but only abnormal results are displayed) Labs Reviewed  CBC  WITH DIFFERENTIAL/PLATELET - Abnormal; Notable for the following components:      Result Value   WBC 11.0 (*)    Neutro Abs 8.7 (*)    All other components within normal limits    EKG None  Radiology DG Elbow Complete Left  Result Date: 11/26/2019 CLINICAL DATA:  Left arm pain after fall. EXAM: LEFT ELBOW - COMPLETE 3+ VIEW COMPARISON:  None. FINDINGS: There is no evidence of fracture, dislocation, or joint effusion. There is no evidence of arthropathy or other focal bone abnormality. Significant soft tissue swelling is noted posterior to the distal humerus. IMPRESSION: No fracture or  dislocation is noted. Significant soft tissue swelling is noted posterior to the distal humerus. Electronically Signed   By: Marijo Conception M.D.   On: 11/26/2019 13:42   CT Head Wo Contrast  Result Date: 11/26/2019 CLINICAL DATA:  Head laceration after fall. EXAM: CT HEAD WITHOUT CONTRAST TECHNIQUE: Contiguous axial images were obtained from the base of the skull through the vertex without intravenous contrast. COMPARISON:  October 04, 2018. FINDINGS: Brain: Mild chronic ischemic white matter disease is noted. No mass effect or midline shift is noted. Ventricular size is within normal limits. There is no evidence of mass lesion, hemorrhage or acute infarction. Vascular: No hyperdense vessel or unexpected calcification. Skull: Normal. Negative for fracture or focal lesion. Sinuses/Orbits: No acute finding. Other: None. IMPRESSION: Mild chronic ischemic white matter disease. No acute intracranial abnormality seen. Electronically Signed   By: Marijo Conception M.D.   On: 11/26/2019 13:13   DG Shoulder Left  Result Date: 11/26/2019 CLINICAL DATA:  Left arm pain after fall. EXAM: LEFT SHOULDER - 2+ VIEW COMPARISON:  None. FINDINGS: There is no evidence of fracture or dislocation. There is no evidence of arthropathy or other focal bone abnormality. Soft tissues are unremarkable. IMPRESSION: Negative. Electronically Signed    By: Marijo Conception M.D.   On: 11/26/2019 13:44   DG Knee Complete 4 Views Left  Result Date: 11/26/2019 CLINICAL DATA:  Left knee pain after fall. EXAM: LEFT KNEE - COMPLETE 4+ VIEW COMPARISON:  None. FINDINGS: Status post left total knee arthroplasty. The femoral and tibial components appear to be well situated. No fracture or dislocation is noted. Soft tissues are unremarkable. IMPRESSION: Status post left total knee arthroplasty. No acute abnormality seen. Electronically Signed   By: Marijo Conception M.D.   On: 11/26/2019 13:45   DG Humerus Left  Result Date: 11/26/2019 CLINICAL DATA:  Left arm pain after fall. EXAM: LEFT HUMERUS - 2+ VIEW COMPARISON:  November 26, 2019. FINDINGS: There is no evidence of fracture or other focal bone lesions. Soft tissues are unremarkable. IMPRESSION: Negative. Electronically Signed   By: Marijo Conception M.D.   On: 11/26/2019 13:40    Procedures .Marland KitchenLaceration Repair  Date/Time: 11/26/2019 1:01 PM Performed by: Lennice Sites, DO Authorized by: Lennice Sites, DO   Consent:    Consent obtained:  Verbal   Consent given by:  Patient   Risks discussed:  Infection, need for additional repair, nerve damage, pain, poor cosmetic result, retained foreign body, tendon damage and vascular damage   Alternatives discussed:  No treatment, delayed treatment and observation Anesthesia (see MAR for exact dosages):    Anesthesia method:  Local infiltration   Local anesthetic:  Lidocaine 1% WITH epi Laceration details:    Location:  Shoulder/arm   Shoulder/arm location:  L upper arm   Length (cm):  10   Depth (mm):  5 Repair type:    Repair type:  Complex Pre-procedure details:    Preparation:  Patient was prepped and draped in usual sterile fashion Exploration:    Hemostasis achieved with:  Epinephrine and direct pressure   Wound exploration: wound explored through full range of motion and entire depth of wound probed and visualized     Wound extent: no areolar  tissue violation noted, no fascia violation noted, no foreign bodies/material noted, no muscle damage noted, no nerve damage noted, no tendon damage noted, no underlying fracture noted and no vascular damage noted     Contaminated: no   Treatment:  Area cleansed with:  Hibiclens   Amount of cleaning:  Standard   Visualized foreign bodies/material removed: no     Debridement:  None   Undermining:  None   Scar revision: no   Skin repair:    Repair method:  Staples   Number of staples:  18 Approximation:    Approximation:  Close Post-procedure details:    Dressing:  Non-adherent dressing (ABD, coband)   Patient tolerance of procedure:  Tolerated well, no immediate complications   (including critical care time)  Medications Ordered in ED Medications  Tdap (BOOSTRIX) injection 0.5 mL (0.5 mLs Intramuscular Given 11/26/19 1412)  lidocaine-EPINEPHrine (XYLOCAINE W/EPI) 1 %-1:100000 (with pres) injection 20 mL (20 mLs Intradermal Given 11/26/19 1248)    ED Course  I have reviewed the triage vital signs and the nursing notes.  Pertinent labs & imaging results that were available during my care of the patient were reviewed by me and considered in my medical decision making (see chart for details).    MDM Rules/Calculators/A&P                          Fredrika Yanelie Abraha is an 79 year old female with history of diabetes, dementia, CKD who presents to the ED after a fall.  Patient is on Plavix.  Normal vitals.  No fever.  Significant laceration to the left triceps area with venous bleeding that is steady.  Patient had tourniquet placed by EMS.  That was removed.  Patient has good radial and ulnar pulses of the left upper extremity.  She has good strength and sensation in the left upper extremity.  She is able to flex and extend at the elbow and overall strength appears intact.  Does not appear to have any nerve or tendon injury.  Given the amount of heavy venous bleeding was able to quickly stay  full laceration after irrigation and placement of lidocaine epi.  18 staples were placed in hemostasis appeared to be achieved.  ABD and Coban dressing has been placed.  We will get x-rays of the left upper extremity but laceration does not overlie joint.  She appears to have good range of motion of the left upper extremity.  Also having some left knee pain.  We will get a CT scan of her head because she is not sure if she hit her head or not.  Will check blood work given a fair amount of blood loss.  X-rays of the left upper extremity negative for fracture.  Head CT unremarkable and no intracranial injury.  Patient has had no further severe bleeding while in the ED.  Dressing that has been placed does not appear to be heavily saturated and there is no further bleeding down the arm.  Overall hemostasis appears to be achieved.  We will have her continue bandage at home.  Awaiting CBC but patient is not tachycardic, overall feels steady at this time.  Patient had mechanical fall today because she just started to wear new leg braces.  Recommend that she ambulate safely the best she can.  We will apply a sling for comfort.  Neurovascularly neuromuscularly intact throughout my care.  No concern for deep tendon or nerve issue and no major vascular injury.  Hemoglobin is 12.5.  This appears to be her baseline.  Initial injury began around 3 hours ago and overall wound appears hemostatic at this time.  She does not feel dizzy or lightheaded.  She understands wound care  instructions.  Will start Keflex as well.  Tetanus has been updated.  Discharged in good condition.  This chart was dictated using voice recognition software.  Despite best efforts to proofread,  errors can occur which can change the documentation meaning.    Final Clinical Impression(s) / ED Diagnoses Final diagnoses:  Laceration of arm, left, initial encounter    Rx / DC Orders ED Discharge Orders         Ordered    cephALEXin (KEFLEX) 500  MG capsule  2 times daily        11/26/19 [place], [place], DO 11/26/19 1439

## 2019-11-26 NOTE — ED Notes (Signed)
Ortho tech contacted. 

## 2019-11-26 NOTE — Discharge Instructions (Signed)
Please keep dressing in place for the next 24 hours.  Afterwards please exchange for basic dressing as we discussed.  If there is any more significant bleeding please return.  Take antibiotics as prescribed.  Staples will need to be removed in about 10 to 12 days.  You may follow-up with your primary care doctor return to the emergency department.  If you develop any fever, redness, drainage from laceration site please return for evaluation as well.  Please keep the wound clean and dry for the next 48 hours.

## 2019-11-29 DIAGNOSIS — W19XXXA Unspecified fall, initial encounter: Secondary | ICD-10-CM | POA: Diagnosis not present

## 2019-11-29 DIAGNOSIS — Z6831 Body mass index (BMI) 31.0-31.9, adult: Secondary | ICD-10-CM | POA: Diagnosis not present

## 2019-11-29 DIAGNOSIS — R2681 Unsteadiness on feet: Secondary | ICD-10-CM | POA: Diagnosis not present

## 2019-11-29 DIAGNOSIS — S41119A Laceration without foreign body of unspecified upper arm, initial encounter: Secondary | ICD-10-CM | POA: Diagnosis not present

## 2019-11-29 DIAGNOSIS — E669 Obesity, unspecified: Secondary | ICD-10-CM | POA: Diagnosis not present

## 2019-12-04 NOTE — Progress Notes (Signed)
[facility]   [phone] [phone]   Clinic Follow up Note   Patient Care Team: [name], DO as PCP - General (General Practice) Leonie Man, MD as PCP - Cardiology (Cardiology) Elmarie Shiley, MD as Consulting Physician (Nephrology) Leonie Man, MD as Consulting Physician (Cardiology) Alphonsa Overall, MD as Consulting Physician (General Surgery) Kyung Rudd, MD as Consulting Physician (Radiation Oncology) Truitt Merle, MD as Consulting Physician (Hematology) Delice Bison, Charlestine Massed, NP as Nurse Practitioner (Hematology and Oncology) Pleasant, Eppie Gibson, RN as [facility]  Date of Service:  12/05/2019  CHIEF COMPLAINT: F/u for Bilateral invasive breast cancer  SUMMARY OF ONCOLOGIC HISTORY: Oncology History Overview Note  Cancer Staging Malignant 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, estrogen receptor positive ([place]) Staging form: Breast, AJCC 8th Edition - Clinical: Stage IA (cT1c, cN0, cM0, G1, ER: Positive, PR: Positive, HER2: Negative) - Unsigned     Malignant 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, estrogen receptor positive ([place])  10/20/2016 Initial Biopsy   Diagnosis Breast, right, needle core biopsy, 11:30 o'clock - INVASIVE DUCTAL CARCINOMA,   10/20/2016 Initial Diagnosis   Malignant 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, estrogen receptor positive ([place])   10/20/2016 Receptors her2   Estrogen Receptor: 100%, POSITIVE, STRONG STAINING INTENSITY Progesterone Receptor: 100%, POSITIVE, STRONG STAINING INTENSITY Proliferation Marker Ki67: 5% HER2 NEGATIVE    10/20/2016 Mammogram   Diagnostic mammogram showed persistent distortion corresponds to a vaguely palpable irregular mass and is suspicious for malignancy.Targeted ultrasound is performed, showing an irregular hypoechoic mass with posterior acoustic shadowing in the 11:30 o'clock  location of the right breast 4 cm from the nipple which measures 0.9 x 0.9 x0.8 cm. There is associated internal vascularity. Evaluation of the axilla is negative for adenopathy.   11/01/2016 Imaging   B/l BREAST MRI: IMPRESSION: 1. There is a 1.5 cm biopsy proven malignancy in the upper slightly outer quadrant of the right breast. No additional sites of disease are identified.  2. There is an indeterminate 6 mm enhancing mass in the upper-outer left breast.   11/11/2016 Pathology Results   Diagnosis Breast, left, needle core biopsy, upper outer - INVASIVE DUCTAL CARCINOMA. - ATYPICAL LOBULAR HYPERPLASIA. - FIBROCYSTIC CHANGES AND PSEUDOANGIOMATOUS STROMAL HYPERPLASIA ([place]).   11/11/2016 Receptors her2    Estrogen Receptor: 100%, POSITIVE, STRONG STAINING INTENSITY Progesterone Receptor: 10%, POSITIVE, WEAK STAINING INTENSITY Proliferation Marker Ki67: 2% HER2 NEGATIVE    12/03/2016 Genetic Testing   Germline genetic testing was performed through [employer]'s Common Hereditary Cancers Panel + [facility]'s Melanoma Panel. This custom panel includes analysis of the following 51 genes: APC, ATM, AXIN2, BAP1, BARD1, BMPR1A, BRCA1, BRCA2, BRIP1, CDH1, CDK4, CDKN2A, CHEK2, CTNNA1, DICER1, EPCAM, GREM1, HOXB13, KIT, MEN1, MITF, MLH1, MSH2, MSH3, MSH6, MUTYH, NBN, NF1, NTHL1, PALB2, PDGFRA, PMS2, POLD1, POLE, POT1, PTEN, RAD50, RAD51C, RAD51D, RB1, SDHA, SDHB, SDHC, SDHD, SMAD4, SMARCA4, STK11, TP53, TSC1, TSC2, and VHL.  A variant of uncertain significance (VUS) was noted in BARD1.    12/14/2016 Miscellaneous   Oncotype Recurrence score of 16 10-year risk of distant recurrence with Tamoxifen alone is 10%   12/14/2016 Surgery   BILATERAL BREAST LUMPECTOMY WITH BILATERAL  RADIOACTIVE SEED AND BILATERAL AXILLARY  SENTINEL LYMPH NODE BIOPSY ERAS PATHWAY by Dr. Lucia Gaskins on 12/14/16   12/14/2016 Pathology Results   Diagnosis 12/14/16 1. Breast, lumpectomy, Left w/seed - INVASIVE DUCTAL CARCINOMA, 1  CM. - ATYPICAL LOBULAR HYPERPLASIA ([place]). - PREVIOUS  BIOPSY SITE. - INVASIVE CARCINOMA 0.2 CM FROM LATERAL MARGIN. - ATYPICA LOBULAR HYPERPLASIA LESS THAN 0.1 CM FROM LATERAL MARGIN. 2. Breast, lumpectomy, Right w/seed - INVASIVE DUCTAL CARCINOMA, 1.1 CM. - FIBROCYSTIC CHANGES. - PREVIOUS BIOPSY SITE. - INVASIVE CARCINOMA 1.1 CM FROM POSTERIOR MARGIN. 3. Lymph node, sentinel, biopsy, Right Axillary Contents - BENIGN FIBROADIPOSE TISSUE. - NO LYMPH NODE TISSUE OR MALIGNANCY. 4. Lymph node, sentinel, biopsy, Left Axillary - ONE BENIGN LYMPH NODE (0/1).    01/17/2017 - 02/11/2017 Radiation Therapy   1. The Left breast was treated to 42.5 Gy in 17 fractions of 2.5 Gy.  2. The Right breast was treated to 42.5 Gy in 17 fractions of 2.5 Gy.  3. The Right breast was boosted to 7.5 Gy in 3 fractions of 2.5 Gy.  4. The Left breast was boosted to 7.5 Gy in 3 fractions of 2.5 Gy.       02/2017 -  Anti-estrogen oral therapy   Exemestane daily      CURRENT THERAPY:  Adjuvant Exemestane started on 03/07/17  INTERVAL HISTORY:  [name] is here for a follow up of b/l breast cancer. She was last seen by me 9 months ago and seen by NP Lacie 5 months ago. She presents to the clinic with her husband. She notes she has significant LE. She had tried ankle braces to help her swelling but resulted in a bad fall which required stiches of her left arm. She notes she tried [place] for 1 months and now on Januvia orally. She is no longer on insulin injections. She also notes weight gain which she attributes to lack of movement. She thinks she needs home PT to help her ambulate with ankle braces. She has been working on her diet with less sodas. She tried to drink more water. She notes she has received both her COVID19 vaccines.  She notes she is tolerating exemestane well with hair thinning. She notes her daughter is being treated for metastatic breast cancer in our cancer center and her sister has  metastatic cervical cancer.    REVIEW OF SYSTEMS:   Constitutional: Denies fevers, chills or abnormal weight loss (+) Weight gain  Eyes: Denies blurriness of vision Ears, nose, mouth, throat, and face: Denies mucositis or sore throat Respiratory: Denies cough, dyspnea or wheezes Cardiovascular: Denies palpitation, chest discomfort (+) lower extremity swelling Gastrointestinal:  Denies nausea, heartburn or change in bowel habits Skin: Denies abnormal skin rashes Lymphatics: Denies new lymphadenopathy or easy bruising Neurological:Denies numbness, tingling or new weaknesses Behavioral/Psych: Mood is stable, no new changes  All other systems were reviewed with the patient and are negative.  MEDICAL HISTORY:  Past Medical History:  Diagnosis Date  . Anemia    have received iron infusions  . Anxiety   . Arthritis     osteoarthritis  . Breast cancer in female [facility])    Bilateral  . CAD S/P percutaneous coronary angioplasty 2007; March 2011   [facility] 3.0 mm 20 mm postdilated 3.6 mm in early mid LAD; status post ISR Cutting Balloon PTCA and March 11 along with PCI of distal mid lesion with a 3.0 mm 12 mm MultiLink vision BMS; the proximal stent causes jailing of SP1 and SP2 with ostial 70-80% lesions  . Cancer (HCC)    Melanoma, Squamous cell Carcinoma  . Chronic diastolic CHF (congestive heart failure), NYHA class 2 ([place]) 02/28/2018  . Chronic kidney disease    stage 2   . Dementia ([place])  mild  . Diabetes mellitus type 2 with neurological manifestations ([place])   . Dyslipidemia, goal LDL below 70   . Full dentures   . Genetic testing 12/03/2016   Germline genetic testing was performed through [employer]'s Common Hereditary Cancers Panel + [facility]'s Melanoma Panel. This custom panel includes analysis of the following 51 genes: APC, ATM, AXIN2, BAP1, BARD1, BMPR1A, BRCA1, BRCA2, BRIP1, CDH1, CDK4, CDKN2A, CHEK2, CTNNA1, DICER1, EPCAM, GREM1, HOXB13, KIT, MEN1, MITF, MLH1, MSH2, MSH3,  MSH6, MUTYH, NBN, NF1, NTHL1, PALB2, PDGFRA, PMS2, POLD1, POL  . GERD (gastroesophageal reflux disease)   . Headache(784.0)   . History of hematuria    Followed by Dr. Gaynelle Arabian  . Hypertension, essential, benign   . Peripheral neuropathy   . Personal history of radiation therapy   . Stroke ([place])   . TIA (transient ischemic attack)    multiple in the past  . Wears glasses     SURGICAL HISTORY: Past Surgical History:  Procedure Laterality Date  . ABDOMINAL HYSTERECTOMY    . ANKLE FUSION Right 02/22/2018   Procedure: RIGHT SUBTALAR AND TALONAVICULAR FUSION;  Surgeon: Newt Minion, MD;  Location: [facility];  Service: Orthopedics;  Laterality: Right;  . APPENDECTOMY    . BLADDER SUSPENSION    . BREAST LUMPECTOMY Bilateral 2018  . BREAST LUMPECTOMY WITH RADIOACTIVE SEED AND SENTINEL LYMPH NODE BIOPSY Bilateral 12/14/2016   Procedure: BILATERAL BREAST LUMPECTOMY WITH BILATERAL  RADIOACTIVE SEED AND BILATERAL AXILLARY  SENTINEL LYMPH NODE BIOPSY ERAS PATHWAY;  Surgeon: Alphonsa Overall, MD;  Location: [facility];  Service: General;  Laterality: Bilateral;  PECTORAL BLOCK  . CARDIAC CATHETERIZATION  02/24/2006   85% stenosis in the proximal portion of LAD-arrangements made for PCI on 02/25/2006  . CARDIAC CATHETERIZATION  02/25/2006   80% LAD lesion stented with a 3x42mm [employer]te stent resulting in reduction of 80% lesion to 0% residual  . CARDIAC CATHETERIZATION  04/26/2006   Medical management  . CARDIAC CATHETERIZATION  06/24/2009   60-70% re-stenosis in the proximal LAD. A 3.25x15 cutting balloon, 3 inflations - 14atm-38sec, 13atm-39sec, and 12atm-40sec reduced to less than 10%. 60% stenosis of the mid/distal LAD stented with a 3x39mm Multilink stent.  Marland Kitchen CARDIAC CATHETERIZATION  07/09/2009   Medical management  . CAROTID DOPPLER  06/24/2009   40-59% R ICA stenosis. No significant ICA stenosis noted  . CHOLECYSTECTOMY    . DILATION AND CURETTAGE OF UTERUS    . JOINT REPLACEMENT Left   . LESION  REMOVAL Right 03/11/2015   Procedure:  EXCISION OF RIGHT PRE TIBIAL LESION;  Surgeon: Judeth Horn, MD;  Location: [facility];  Service: General;  Laterality: Right;  . MASS EXCISION Left 06/10/2014   Procedure: EXCISION LEFT LOWER LEG LESION;  Surgeon: Doreen Salvage, MD;  Location: [facility];  Service: General;  Laterality: Left;  Marland Kitchen MASS EXCISION Left 09/05/2015   Procedure: EXCISION OF LEFT FOREARM  MASS;  Surgeon: Judeth Horn, MD;  Location: [facility];  Service: General;  Laterality: Left;  . NM MYOVIEW LTD  11/16/2011   5 beats a PVC during recovery.  No skin or infarction.  Reached 5 METs, EKG negative for ischemia   . NM MYOVIEW LTD  10/2011   No ischemia or infarction.  Normal EF.  LOW RISK. (Short bursts of 5 beats NSVT during recovery otherwise normal)  . SHOULDER ARTHROSCOPY  10/15  . SHOULDER ARTHROSCOPY  1995   left  . TEE WITHOUT CARDIOVERSION N/A 08/03/2017  Procedure: TRANSESOPHAGEAL ECHOCARDIOGRAM (TEE);  Surgeon: Skeet Latch, MD;  Location: [facility];  Service: Cardiovascular;; EF 60-65% with no wall motion normalities.  No atrial thrombus noted.  Lightly findings consistent with a lipomatous hypertrophy of the atrial septum.   . TENDON REPAIR Left 05/12/2016   Procedure: Left Anterior Tibial Tendon Reconstruction;  Surgeon: Newt Minion, MD;  Location: [facility];  Service: Orthopedics;  Laterality: Left;  . TOTAL KNEE ARTHROPLASTY  2005   left  . TRANSESOPHAGEAL ECHOCARDIOGRAM  08/03/2016   : EF 60-65% no wall motion normalities.  No atrial thrombus.  Lipomatous hypertrophy of the atrial septum.  No mass noted.  . TRANSTHORACIC ECHOCARDIOGRAM  10/2015   EF 55-60 %. Mild LVH. Mild pulmonary hypertension. Normal valves.  . TRANSTHORACIC ECHOCARDIOGRAM  07/2017   Vigorous LV function.  EF 65-70%.  Mild diastolic dysfunction with no RWMA. GR 1 DD.  Mild aortic sclerosis/no stenosis.  Questionable right atrial mass discounted with TEE)     . WRIST ARTHROPLASTY  2010   cancer lt wrist    I have reviewed the social history and family history with the patient and they are unchanged from previous note.  ALLERGIES:  is allergic to ultram [tramadol], lipitor [atorvastatin], lyrica [pregabalin], reglan [metoclopramide], and nsaids.  MEDICATIONS:  Current Outpatient Medications  Medication Sig Dispense Refill  . bumetanide (BUMEX) 2 MG tablet Take 1 tablet (2 mg total) by mouth 2 (two) times daily. (Patient taking differently: Take 2 mg by mouth 2 (two) times daily. 2 tablets in the morning and 1 at noon) 60 tablet 0  . Calcium Carb-Cholecalciferol (CALCIUM 600+D3) 600-800 MG-UNIT TABS Take 1 tablet by mouth daily.    . clopidogrel (PLAVIX) 75 MG tablet TAKE 1 TABLET DAILY 90 tablet 3  . COLCRYS 0.6 MG tablet TAKE 1 TABLET DAILY AS NEEDED FOR GOUT FLARE. 90 tablet 3  . Cranberry 500 MG TABS Take 500 mg by mouth every evening.    . diazepam (VALIUM) 5 MG tablet Take 1 tablet (5 mg total) by mouth 2 (two) times daily as needed for anxiety. 10 tablet 0  . doxycycline (VIBRA-TABS) 100 MG tablet Take 1 tablet (100 mg total) by mouth 2 (two) times daily. 20 tablet 0  . exemestane (AROMASIN) 25 MG tablet TAKE 1 TABLET DAILY AFTER BREAKFAST 90 tablet 3  . febuxostat (ULORIC) 40 MG tablet TAKE 1 TABLET DAILY 90 tablet 3  . gabapentin (NEURONTIN) 300 MG capsule Take 300-600 mg by mouth daily as needed (nerve pain).    Marland Kitchen gabapentin (NEURONTIN) 300 MG capsule Take 1 capsule (300 mg total) by mouth 2 (two) times daily. Take 1 during the day if needed and 2 at night 270 capsule 1  . Insulin Lispro (HUMALOG KWIKPEN [place]) Inject into the skin. 100-150= 2 units, 151-200= 3 units and 201-250= 4 units    . insulin NPH Human (HUMULIN N,NOVOLIN N) 100 UNIT/ML injection Inject 20 Units into the skin See admin instructions. Inject 20 units in the morning & inject 25 units after supper.    . insulin NPH-regular Human (70-30) 100 UNIT/ML injection Inject 20  Units into the skin 2 (two) times daily with a meal.    . Krill Oil 500 MG CAPS Take 500 mg by mouth daily.    . Methylfol-Methylcob-Acetylcyst (CEREFOLIN NAC) 6-2-600 MG TABS TAKE 1 TABLET BY MOUTH EVERY DAY 90 tablet 3  . metolazone (ZAROXOLYN) 2.5 MG tablet TAKE 1 TABLET DAILY 30 MINUTES BEFORE THE MORNING DOSE OF BUMEX  AS DIRECTED 90 tablet 1  . metoprolol succinate (TOPROL XL) 25 MG 24 hr tablet Take 0.5 tablets (12.5 mg total) by mouth at bedtime. 45 tablet 3  . mirtazapine (REMERON) 15 MG tablet Take 15 mg by mouth at bedtime. Taking 1 1/2 tablet at bedtime    . nitroGLYCERIN (NITROSTAT) 0.4 MG SL tablet Place 0.4 mg under the tongue every 5 (five) minutes as needed for chest pain.    Marland Kitchen oxyCODONE-acetaminophen (PERCOCET/ROXICET) 5-325 MG tablet Take 1 tablet by mouth every 6 (six) hours as needed for severe pain. 28 tablet 0  . pantoprazole (PROTONIX) 40 MG tablet TAKE 1 TABLET DAILY (CONTACT OFFICE FOR ADDITIONAL REFILLS, FINAL ATTEMPT) (Patient taking differently: Take 40 mg by mouth daily before breakfast. ) 90 tablet 0  . potassium chloride (KLOR-CON) 20 MEQ packet Take 20 mEq by mouth 2 (two) times daily. 2 tablets in the morning and 1 at bedtime    . pramipexole (MIRAPEX) 0.25 MG tablet TAKE 1 TO 2 TABLETS AS INSTRUCTED, 1 TABLET AT 4:00 P.M. AND 2 TABLETS AT BEDTIME 270 tablet 1  . rosuvastatin (CRESTOR) 5 MG tablet Take 5 mg by mouth daily.    . sennosides-docusate sodium (SENOKOT-S) 8.6-50 MG tablet Take 1 tablet by mouth daily as needed for constipation.    Marland Kitchen Ubiquinol (QUNOL COQ10/UBIQUINOL/MEGA) 100 MG CAPS Take 100 mg by mouth every evening. CoQ10, Qunol      No current facility-administered medications for this visit.    PHYSICAL EXAMINATION: ECOG PERFORMANCE STATUS: 3 - Symptomatic, >50% confined to bed  Vitals:   12/05/19 1028  BP: (!) 167/93  Pulse: 97  Resp: 19  Temp: 98.6 F (37 C)  SpO2: 99%   Filed Weights   12/05/19 1028  Weight: 187 lb 14.4 oz (85.2 kg)      GENERAL:alert, no distress and comfortable SKIN: skin color, texture, turgor are normal, no rashes or significant lesions (+) Sporadic skin rash  EYES: normal, Conjunctiva are pink and non-injected, sclera clear  NECK: supple, thyroid normal size, non-tender, without nodularity LYMPH:  no palpable lymphadenopathy in the cervical, axillary  LUNGS: clear to auscultation and percussion with normal breathing effort HEART: regular rate & rhythm and no murmurs and no lower extremity edema ABDOMEN:abdomen soft, non-tender and normal bowel sounds Musculoskeletal:no cyanosis of digits and no clubbing  NEURO: alert & oriented x 3 with fluent speech, no focal motor/sensory deficits BREAST: S/p b/l lumpectomy: Surgical incision healed well. No palpable mass, nodules or adenopathy bilaterally. Breast exam benign.   LABORATORY DATA:  I have reviewed the data as listed CBC Latest Ref Rng & Units 12/05/2019 11/26/2019 10/04/2018  WBC 4.0 - 10.5 K/uL 9.9 11.0(H) 10.0  Hemoglobin 12.0 - 15.0 g/dL 11.1(L) 12.5 12.1  Hematocrit 36 - 46 % 34.5(L) 39.9 38.1  Platelets 150 - 400 K/uL 294 230 259     CMP Latest Ref Rng & Units 12/05/2019 10/04/2018 03/04/2018  Glucose 70 - 99 mg/dL 125(H) 159(H) 106(H)  BUN 8 - 23 mg/dL 13 42(H) 20  Creatinine 0.44 - 1.00 mg/dL 1.61(H) 1.89(H) 1.70(H)  Sodium 135 - 145 mmol/L 138 136 139  Potassium 3.5 - 5.1 mmol/L 4.7 3.0(L) 4.2  Chloride 98 - 111 mmol/L 100 96(L) 100  CO2 22 - 32 mmol/L $RemoveB'30 27 26  'LLkYzUKo$ Calcium 8.9 - 10.3 mg/dL 10.0 9.5 9.6  Total Protein 6.5 - 8.1 g/dL 7.2 6.5 -  Total Bilirubin 0.3 - 1.2 mg/dL 0.6 0.8 -  Alkaline Phos 38 - 126  U/L 110 95 -  AST 15 - 41 U/L 39 24 -  ALT 0 - 44 U/L 26 17 -      RADIOGRAPHIC STUDIES: I have personally reviewed the radiological images as listed and agreed with the findings in the report. No results found.   ASSESSMENT & PLAN:  [name] is a 79 y.o. 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 She is s/p b/l breast lumpectomy and has adjuvant radiation.Her RS was 16 and no benefit from chemotherapy so not recommended.  -She started antiestrogen therapy with Exemestane in 02/2017.She istolerating wellwith no side effects, plan for 5 years therapy  -From a breast cancer standpoint, she is clinically doing well. Lab reviewed, her CBC and CMP are within normal limits except Hg 11.1, BG 125, Cr 1.61. Her physical exam benign. Her 10/2019 mammogram was unremarkable. There is no clinical concern for recurrence. -Continue surveillance. Next mammogram in 10/2020 -Continue Exemestane -f/u in 6 months    2. Genetics Negative for pathogenetic mutations.   3. HTN, DM, CAD and CHF with LE edema, CKD Stage IV, Skin Cancers (mainly squamous Cel) -Continue f/u withPCP, Dermatologist, nephrologist [name], and her cardiologist [name]  4. Arthritis,diabetic foot, recent fall and balance issue  -She underwent right foot surgery in 01/2018. She had sepsis afterward, has resolved/recovered.  -She had diabetic foot along with worsened LE edema in her ankles which effects her balance.  -She tried b/l lower leg braces with orthopedist recently to help her balance but resulted in a bad fall in 10/2019.  -she has difficulty to ambulate due to her balance issue  -I recommend home PT to help gain strength and improve balance.   5. Osteopenia  -Her 04/2019 DEXA showed worsened Osteopenia with lowest T-score -2.3 at left hip. -Given her CKD, will hold treatment with Bisphosphonate  -Continue calcium and vit D  6. Right shoulder and axilla neuralgia  -Pt attributes this to her prior breast surgery  -She does have h/o shingles around her right upper back and flank -I discussed option of acupuncture to help with her pain. She will  think about it.    PLAN -Send home PT referral  -Continue Exemestane -Lab and f/u in 85months   No problem-specific Assessment & Plan notes found for this encounter.   Orders Placed This Encounter  Procedures  . Ambulatory referral to Home Health    Referral Priority:   Routine    Referral Type:   Home Health Care    Referral Reason:   Specialty Services Required    Requested Specialty:   [place]    Number of Visits Requested:   1   All questions were answered. The patient knows to call the clinic with any problems, questions or concerns. No barriers to learning was detected. The total time spent in the appointment was 30 minutes.     Truitt Merle, MD 12/05/2019   I, Joslyn Devon, am acting as scribe for Truitt Merle, MD.   I have reviewed the above documentation for accuracy and completeness, and I agree with the above.

## 2019-12-05 ENCOUNTER — Inpatient Hospital Stay (HOSPITAL_BASED_OUTPATIENT_CLINIC_OR_DEPARTMENT_OTHER): Payer: Medicare Other | Admitting: Hematology

## 2019-12-05 ENCOUNTER — Encounter: Payer: Self-pay | Admitting: Hematology

## 2019-12-05 ENCOUNTER — Other Ambulatory Visit: Payer: Self-pay | Admitting: Cardiology

## 2019-12-05 ENCOUNTER — Inpatient Hospital Stay: Payer: Medicare Other | Attending: Nurse Practitioner

## 2019-12-05 ENCOUNTER — Other Ambulatory Visit: Payer: Self-pay

## 2019-12-05 VITALS — BP 167/93 | HR 97 | Temp 98.6°F | Resp 19 | Ht 66.0 in | Wt 187.9 lb

## 2019-12-05 DIAGNOSIS — I5032 Chronic diastolic (congestive) heart failure: Secondary | ICD-10-CM | POA: Diagnosis not present

## 2019-12-05 DIAGNOSIS — Z170 Estrogen receptor positive status [ER+]: Secondary | ICD-10-CM | POA: Diagnosis not present

## 2019-12-05 DIAGNOSIS — F0390 Unspecified dementia without behavioral disturbance: Secondary | ICD-10-CM | POA: Insufficient documentation

## 2019-12-05 DIAGNOSIS — I130 Hypertensive heart and chronic kidney disease with heart failure and stage 1 through stage 4 chronic kidney disease, or unspecified chronic kidney disease: Secondary | ICD-10-CM | POA: Diagnosis not present

## 2019-12-05 DIAGNOSIS — K219 Gastro-esophageal reflux disease without esophagitis: Secondary | ICD-10-CM | POA: Diagnosis not present

## 2019-12-05 DIAGNOSIS — F419 Anxiety disorder, unspecified: Secondary | ICD-10-CM | POA: Insufficient documentation

## 2019-12-05 DIAGNOSIS — Z794 Long term (current) use of insulin: Secondary | ICD-10-CM | POA: Diagnosis not present

## 2019-12-05 DIAGNOSIS — E1142 Type 2 diabetes mellitus with diabetic polyneuropathy: Secondary | ICD-10-CM | POA: Diagnosis not present

## 2019-12-05 DIAGNOSIS — C50412 Malignant neoplasm of upper-outer quadrant of left female breast: Secondary | ICD-10-CM | POA: Insufficient documentation

## 2019-12-05 DIAGNOSIS — Z923 Personal history of irradiation: Secondary | ICD-10-CM | POA: Diagnosis not present

## 2019-12-05 DIAGNOSIS — C50211 Malignant neoplasm of upper-inner quadrant of right female breast: Secondary | ICD-10-CM | POA: Insufficient documentation

## 2019-12-05 DIAGNOSIS — E1122 Type 2 diabetes mellitus with diabetic chronic kidney disease: Secondary | ICD-10-CM | POA: Insufficient documentation

## 2019-12-05 DIAGNOSIS — E118 Type 2 diabetes mellitus with unspecified complications: Secondary | ICD-10-CM

## 2019-12-05 DIAGNOSIS — M85852 Other specified disorders of bone density and structure, left thigh: Secondary | ICD-10-CM | POA: Insufficient documentation

## 2019-12-05 DIAGNOSIS — M1990 Unspecified osteoarthritis, unspecified site: Secondary | ICD-10-CM | POA: Insufficient documentation

## 2019-12-05 DIAGNOSIS — Z79811 Long term (current) use of aromatase inhibitors: Secondary | ICD-10-CM | POA: Diagnosis not present

## 2019-12-05 DIAGNOSIS — Z79899 Other long term (current) drug therapy: Secondary | ICD-10-CM | POA: Insufficient documentation

## 2019-12-05 DIAGNOSIS — Z803 Family history of malignant neoplasm of breast: Secondary | ICD-10-CM | POA: Diagnosis not present

## 2019-12-05 DIAGNOSIS — Z8673 Personal history of transient ischemic attack (TIA), and cerebral infarction without residual deficits: Secondary | ICD-10-CM | POA: Insufficient documentation

## 2019-12-05 DIAGNOSIS — E785 Hyperlipidemia, unspecified: Secondary | ICD-10-CM | POA: Diagnosis not present

## 2019-12-05 LAB — CBC WITH DIFFERENTIAL (CANCER CENTER ONLY)
Abs Immature Granulocytes: 0.04 10*3/uL (ref 0.00–0.07)
Basophils Absolute: 0.1 10*3/uL (ref 0.0–0.1)
Basophils Relative: 1 %
Eosinophils Absolute: 0.2 10*3/uL (ref 0.0–0.5)
Eosinophils Relative: 2 %
HCT: 34.5 % — ABNORMAL LOW (ref 36.0–46.0)
Hemoglobin: 11.1 g/dL — ABNORMAL LOW (ref 12.0–15.0)
Immature Granulocytes: 0 %
Lymphocytes Relative: 17 %
Lymphs Abs: 1.7 10*3/uL (ref 0.7–4.0)
MCH: 29.7 pg (ref 26.0–34.0)
MCHC: 32.2 g/dL (ref 30.0–36.0)
MCV: 92.2 fL (ref 80.0–100.0)
Monocytes Absolute: 0.6 10*3/uL (ref 0.1–1.0)
Monocytes Relative: 6 %
Neutro Abs: 7.3 10*3/uL (ref 1.7–7.7)
Neutrophils Relative %: 74 %
Platelet Count: 294 10*3/uL (ref 150–400)
RBC: 3.74 MIL/uL — ABNORMAL LOW (ref 3.87–5.11)
RDW: 15.7 % — ABNORMAL HIGH (ref 11.5–15.5)
WBC Count: 9.9 10*3/uL (ref 4.0–10.5)
nRBC: 0 % (ref 0.0–0.2)

## 2019-12-05 LAB — CMP (CANCER CENTER ONLY)
ALT: 26 U/L (ref 0–44)
AST: 39 U/L (ref 15–41)
Albumin: 3.6 g/dL (ref 3.5–5.0)
Alkaline Phosphatase: 110 U/L (ref 38–126)
Anion gap: 8 (ref 5–15)
BUN: 13 mg/dL (ref 8–23)
CO2: 30 mmol/L (ref 22–32)
Calcium: 10 mg/dL (ref 8.9–10.3)
Chloride: 100 mmol/L (ref 98–111)
Creatinine: 1.61 mg/dL — ABNORMAL HIGH (ref 0.44–1.00)
GFR, Est AFR Am: 34 mL/min — ABNORMAL LOW (ref >60)
GFR, Estimated: 30 mL/min — ABNORMAL LOW (ref >60)
Glucose, Bld: 125 mg/dL — ABNORMAL HIGH (ref 70–99)
Potassium: 4.7 mmol/L (ref 3.5–5.1)
Sodium: 138 mmol/L (ref 135–145)
Total Bilirubin: 0.6 mg/dL (ref 0.3–1.2)
Total Protein: 7.2 g/dL (ref 6.5–8.1)

## 2019-12-05 NOTE — Progress Notes (Signed)
Referral for home PT, ov, demographics, and insurance cards faxed to [facility] at [phone]

## 2019-12-05 NOTE — Telephone Encounter (Signed)
*  STAT* If patient is at the pharmacy, call can be transferred to refill team.   1. Which medications need to be refilled? (please list name of each medication and dose if known) metoprolol succinate (TOPROL XL) 25 MG 24 hr tablet  2. Which pharmacy/location (including street and city if local pharmacy) is medication to be sent to?   [place], [place] - [facility] [address], [address] AT [address]  3. Do they need a 30 day or 90 day supply? [facility] day [facility], 15 day to [facility]  Patient is out of medication. She states [facility] never received the refill. She needs a 90 day sent to [facility] and 15 day to [facility] since she is out of medication.

## 2019-12-06 ENCOUNTER — Telehealth: Payer: Self-pay | Admitting: Hematology

## 2019-12-06 DIAGNOSIS — R2681 Unsteadiness on feet: Secondary | ICD-10-CM | POA: Diagnosis not present

## 2019-12-06 DIAGNOSIS — B0229 Other postherpetic nervous system involvement: Secondary | ICD-10-CM | POA: Diagnosis not present

## 2019-12-06 DIAGNOSIS — E663 Overweight: Secondary | ICD-10-CM | POA: Diagnosis not present

## 2019-12-06 DIAGNOSIS — Z6829 Body mass index (BMI) 29.0-29.9, adult: Secondary | ICD-10-CM | POA: Diagnosis not present

## 2019-12-06 DIAGNOSIS — C50919 Malignant neoplasm of unspecified site of unspecified female breast: Secondary | ICD-10-CM | POA: Diagnosis not present

## 2019-12-06 DIAGNOSIS — S41119A Laceration without foreign body of unspecified upper arm, initial encounter: Secondary | ICD-10-CM | POA: Diagnosis not present

## 2019-12-06 DIAGNOSIS — W19XXXA Unspecified fall, initial encounter: Secondary | ICD-10-CM | POA: Diagnosis not present

## 2019-12-06 DIAGNOSIS — F330 Major depressive disorder, recurrent, mild: Secondary | ICD-10-CM | POA: Diagnosis not present

## 2019-12-06 NOTE — Telephone Encounter (Signed)
Scheduled appts per 9/8 los. Pt's voicemail box was full. Mailed appt reminder and calendar.

## 2019-12-07 NOTE — Patient Outreach (Addendum)
[facility]) Care Management  [place]  11/20/2019 Late entry  Shellia Hartl 1941-03-07 [id number]  [place] telephone call to patient.  [employer] compliance verified. Per patient Her appetite is fair. She is weighing daily. 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 Patient is under a lot of stress with daughter having recent cancer dx. Patient is looking into getting a lift chair. She is exercising by riding bicycle for 15 minutes. Her A1C is down at 4.0. Per patient she has decreased drinking any dark liquids only ginger ale, water and a cup of coffee in the am.   Encounter Medications:  Outpatient Encounter Medications as of 11/20/2019  Medication Sig Note  . bumetanide (BUMEX) 2 MG tablet Take 1 tablet (2 mg total) by mouth 2 (two) times daily. (Patient taking differently: Take 2 mg by mouth 2 (two) times daily. 2 tablets in the morning and 1 at noon)   . Calcium Carb-Cholecalciferol (CALCIUM 600+D3) 600-800 MG-UNIT TABS Take 1 tablet by mouth daily.   . clopidogrel (PLAVIX) 75 MG tablet TAKE 1 TABLET DAILY   . COLCRYS 0.6 MG tablet TAKE 1 TABLET DAILY AS NEEDED FOR GOUT FLARE.   Marland Kitchen Cranberry 500 MG TABS Take 500 mg by mouth every evening.   . diazepam (VALIUM) 5 MG tablet Take 1 tablet (5 mg total) by mouth 2 (two) times daily as needed for anxiety.   Marland Kitchen doxycycline (VIBRA-TABS) 100 MG tablet Take 1 tablet (100 mg total) by mouth 2 (two) times daily.   Marland Kitchen exemestane (AROMASIN) 25 MG tablet TAKE 1 TABLET DAILY AFTER BREAKFAST   . febuxostat (ULORIC) 40 MG tablet TAKE 1 TABLET DAILY   . gabapentin (NEURONTIN) 300 MG capsule Take 300-600 mg by mouth daily as needed (nerve pain).   Marland Kitchen gabapentin (NEURONTIN) 300 MG capsule Take 1 capsule (300 mg total) by mouth 2 (two) times daily. Take 1 during the day if needed and 2 at night   . Insulin Lispro (HUMALOG KWIKPEN [place]) Inject into the skin. 100-150= 2 units, 151-200= 3 units and  201-250= 4 units   . insulin NPH Human (HUMULIN N,NOVOLIN N) 100 UNIT/ML injection Inject 20 Units into the skin See admin instructions. Inject 20 units in the morning & inject 25 units after supper. 02/24/2018: Took 20 units 02/24/18  . insulin NPH-regular Human (70-30) 100 UNIT/ML injection Inject 20 Units into the skin 2 (two) times daily with a meal.   . Krill Oil 500 MG CAPS Take 500 mg by mouth daily.   . Methylfol-Methylcob-Acetylcyst (CEREFOLIN NAC) 6-2-600 MG TABS TAKE 1 TABLET BY MOUTH EVERY DAY   . metolazone (ZAROXOLYN) 2.5 MG tablet TAKE 1 TABLET DAILY 30 MINUTES BEFORE THE MORNING DOSE OF BUMEX AS DIRECTED   . metoprolol succinate (TOPROL XL) 25 MG 24 hr tablet Take 0.5 tablets (12.5 mg total) by mouth at bedtime.   . mirtazapine (REMERON) 15 MG tablet Take 15 mg by mouth at bedtime. Taking 1 1/2 tablet at bedtime   . nitroGLYCERIN (NITROSTAT) 0.4 MG SL tablet Place 0.4 mg under the tongue every 5 (five) minutes as needed for chest pain. 03/23/2018: Has on hand  . oxyCODONE-acetaminophen (PERCOCET/ROXICET) 5-325 MG tablet Take 1 tablet by mouth every 6 (six) hours as needed for severe pain.   . pantoprazole (PROTONIX) 40 MG tablet TAKE 1 TABLET DAILY (CONTACT OFFICE FOR ADDITIONAL REFILLS, FINAL ATTEMPT) (Patient taking differently: Take 40 mg by mouth daily  before breakfast. )   . potassium chloride (KLOR-CON) 20 MEQ packet Take 20 mEq by mouth 2 (two) times daily. 2 tablets in the morning and 1 at bedtime   . pramipexole (MIRAPEX) 0.25 MG tablet TAKE 1 TO 2 TABLETS AS INSTRUCTED, 1 TABLET AT 4:00 P.M. AND 2 TABLETS AT BEDTIME   . rosuvastatin (CRESTOR) 5 MG tablet Take 5 mg by mouth daily.   . sennosides-docusate sodium (SENOKOT-S) 8.6-50 MG tablet Take 1 tablet by mouth daily as needed for constipation.   Marland Kitchen Ubiquinol (QUNOL COQ10/UBIQUINOL/MEGA) 100 MG CAPS Take 100 mg by mouth every evening. CoQ10, Qunol     No facility-administered encounter medications on file as of 11/20/2019.     Functional Status:  In your present state of health, do you have any difficulty performing the following activities: 03/19/2019 01/16/2019  Hearing? N -  Vision? N N  Difficulty concentrating or making decisions? Y Y  Comment - short term memory loss  Walking or climbing stairs? Y Y  Comment - uses a cane at all times  Dressing or bathing? Y Y  Comment - Husband assist with washing the patient hair and bathing  Doing errands, shopping? Y Y  Comment - husband  helps  Conservation officer, nature and eating ? Y Y  Comment - husband prepares food  Using the Toilet? N N  In the past six months, have you accidently leaked urine? N N  Do you have problems with loss of bowel control? N N  Managing your Medications? Y Y  Comment - husband organizes medications  Managing your Finances? Y Y  Comment - husband handles all  Housekeeping or managing your Housekeeping? Y Y  Comment - husband handles  Some recent data might be hidden    Fall/Depression Screening: Fall Risk  11/20/2019 06/19/2019 03/19/2019  Falls in the past year? 1 - 1  Number falls in past yr: 1 1 1   Comment - 3 falls since last outreach with no injury -  Injury with Fall? 0 0 0  Risk for fall due to : History of fall(s);Impaired balance/gait;Impaired mobility History of fall(s);Impaired balance/gait;Impaired mobility History of fall(s);Impaired balance/gait;Impaired mobility  Risk for fall due to: Comment - ortho condition is causing feet to unbalanced -  Follow up Falls evaluation completed Falls evaluation completed;Falls prevention discussed;Education provided Falls evaluation completed;Falls prevention discussed;Education provided   [facility] 2/9 Scores 06/19/2019 03/19/2019 01/16/2019 10/25/2018 07/24/2018 03/31/2018 08/23/2017  PHQ - 2 Score 0 0 0 0 0 0 1   Goals Addressed            This Visit's Progress   . Client verbalize knowledge of Heart Failure disease self management skills by taking medications as per ordered, low sodium  diet, monitoring for CHF exacerbation       CARE PLAN ENTRY (see longtitudinal plan of care for additional care plan information)   Current Barriers:  Marland Kitchen Knowledge deficit related to basic heart failure pathophysiology and self care management  Case Manager Clinical Goal(s):  Marland Kitchen Over the next 90 days, patient will verbalize understanding of Heart Failure Action Plan and when to call doctor . Over the next 90 days, patient will take all Heart Failure mediations as prescribed . Over the next 90 days, patient will weigh daily and record (notifying MD of 3 lb weight gain over night or 5 lb in a week) . Patient will verbalize receiving annual flu vaccine within the next 90 days.   Interventions:  . Basic overview and  discussion of pathophysiology of Heart Failure reviewed  . Provided verbal education on low sodium diet . Provided written education on low sodium diet . Reviewed Heart Failure Action Plan in depth and provided written copy . Discussed importance of daily weight and advised patient to weigh and record daily . Reviewed role of diuretics in prevention of fluid overload and management of heart failure  Patient Self Care Activities:  . Takes Heart Failure Medications as prescribed . Weighs daily and record (notifying MD of 3 lb weight gain over night or 5 lb in a week) . Verbalizes understanding of and follows CHF Action Plan . Adheres to low sodium diet  Initial goal documentation        Assessment:  Patient is monitoring weight Patient is monitoring blood pressure Adhering to low sodium diet Appetite fair Patient is getting lift chair Patient is doing exercises  Plan:  Patient will continue to monitor weight and blood pressure Patient will follow up with PCP Patient will follow up with COVID vaccine booster Patient will follow up with flu vaccine Discussed adhering to low sodium diet RN will follow up within the month of November Camesha Farooq [facility] [facility] [phone]

## 2019-12-10 ENCOUNTER — Other Ambulatory Visit: Payer: Medicare Other | Admitting: Orthotics

## 2019-12-10 DIAGNOSIS — D631 Anemia in chronic kidney disease: Secondary | ICD-10-CM | POA: Diagnosis not present

## 2019-12-10 DIAGNOSIS — E1142 Type 2 diabetes mellitus with diabetic polyneuropathy: Secondary | ICD-10-CM | POA: Diagnosis not present

## 2019-12-10 DIAGNOSIS — E1122 Type 2 diabetes mellitus with diabetic chronic kidney disease: Secondary | ICD-10-CM | POA: Diagnosis not present

## 2019-12-10 DIAGNOSIS — E785 Hyperlipidemia, unspecified: Secondary | ICD-10-CM | POA: Diagnosis not present

## 2019-12-10 DIAGNOSIS — Z7901 Long term (current) use of anticoagulants: Secondary | ICD-10-CM | POA: Diagnosis not present

## 2019-12-10 DIAGNOSIS — Z8673 Personal history of transient ischemic attack (TIA), and cerebral infarction without residual deficits: Secondary | ICD-10-CM | POA: Diagnosis not present

## 2019-12-10 DIAGNOSIS — M8580 Other specified disorders of bone density and structure, unspecified site: Secondary | ICD-10-CM | POA: Diagnosis not present

## 2019-12-10 DIAGNOSIS — D630 Anemia in neoplastic disease: Secondary | ICD-10-CM | POA: Diagnosis not present

## 2019-12-10 DIAGNOSIS — I130 Hypertensive heart and chronic kidney disease with heart failure and stage 1 through stage 4 chronic kidney disease, or unspecified chronic kidney disease: Secondary | ICD-10-CM | POA: Diagnosis not present

## 2019-12-10 DIAGNOSIS — F329 Major depressive disorder, single episode, unspecified: Secondary | ICD-10-CM | POA: Diagnosis not present

## 2019-12-10 DIAGNOSIS — K219 Gastro-esophageal reflux disease without esophagitis: Secondary | ICD-10-CM | POA: Diagnosis not present

## 2019-12-10 DIAGNOSIS — Z90710 Acquired absence of both cervix and uterus: Secondary | ICD-10-CM | POA: Diagnosis not present

## 2019-12-10 DIAGNOSIS — I5032 Chronic diastolic (congestive) heart failure: Secondary | ICD-10-CM | POA: Diagnosis not present

## 2019-12-10 DIAGNOSIS — M19071 Primary osteoarthritis, right ankle and foot: Secondary | ICD-10-CM | POA: Diagnosis not present

## 2019-12-10 DIAGNOSIS — I2510 Atherosclerotic heart disease of native coronary artery without angina pectoris: Secondary | ICD-10-CM | POA: Diagnosis not present

## 2019-12-10 DIAGNOSIS — C50412 Malignant neoplasm of upper-outer quadrant of left female breast: Secondary | ICD-10-CM | POA: Diagnosis not present

## 2019-12-10 DIAGNOSIS — Z955 Presence of coronary angioplasty implant and graft: Secondary | ICD-10-CM | POA: Diagnosis not present

## 2019-12-10 DIAGNOSIS — Z79891 Long term (current) use of opiate analgesic: Secondary | ICD-10-CM | POA: Diagnosis not present

## 2019-12-10 DIAGNOSIS — F419 Anxiety disorder, unspecified: Secondary | ICD-10-CM | POA: Diagnosis not present

## 2019-12-10 DIAGNOSIS — Z9049 Acquired absence of other specified parts of digestive tract: Secondary | ICD-10-CM | POA: Diagnosis not present

## 2019-12-10 DIAGNOSIS — Z85828 Personal history of other malignant neoplasm of skin: Secondary | ICD-10-CM | POA: Diagnosis not present

## 2019-12-10 DIAGNOSIS — N184 Chronic kidney disease, stage 4 (severe): Secondary | ICD-10-CM | POA: Diagnosis not present

## 2019-12-10 DIAGNOSIS — E11621 Type 2 diabetes mellitus with foot ulcer: Secondary | ICD-10-CM | POA: Diagnosis not present

## 2019-12-10 DIAGNOSIS — C50211 Malignant neoplasm of upper-inner quadrant of right female breast: Secondary | ICD-10-CM | POA: Diagnosis not present

## 2019-12-10 DIAGNOSIS — Z981 Arthrodesis status: Secondary | ICD-10-CM | POA: Diagnosis not present

## 2019-12-10 DIAGNOSIS — Z7984 Long term (current) use of oral hypoglycemic drugs: Secondary | ICD-10-CM | POA: Diagnosis not present

## 2019-12-12 ENCOUNTER — Other Ambulatory Visit: Payer: Medicare Other

## 2019-12-12 DIAGNOSIS — E1165 Type 2 diabetes mellitus with hyperglycemia: Secondary | ICD-10-CM | POA: Diagnosis not present

## 2019-12-12 DIAGNOSIS — Z0000 Encounter for general adult medical examination without abnormal findings: Secondary | ICD-10-CM | POA: Diagnosis not present

## 2019-12-12 DIAGNOSIS — Z23 Encounter for immunization: Secondary | ICD-10-CM | POA: Diagnosis not present

## 2019-12-12 DIAGNOSIS — Z6829 Body mass index (BMI) 29.0-29.9, adult: Secondary | ICD-10-CM | POA: Diagnosis not present

## 2019-12-12 DIAGNOSIS — Z794 Long term (current) use of insulin: Secondary | ICD-10-CM | POA: Diagnosis not present

## 2019-12-12 DIAGNOSIS — Z7189 Other specified counseling: Secondary | ICD-10-CM | POA: Diagnosis not present

## 2019-12-12 DIAGNOSIS — S41119A Laceration without foreign body of unspecified upper arm, initial encounter: Secondary | ICD-10-CM | POA: Diagnosis not present

## 2019-12-12 DIAGNOSIS — E663 Overweight: Secondary | ICD-10-CM | POA: Diagnosis not present

## 2019-12-19 ENCOUNTER — Other Ambulatory Visit: Payer: Medicare Other | Admitting: Orthotics

## 2019-12-27 ENCOUNTER — Other Ambulatory Visit: Payer: Medicare Other | Admitting: Orthotics

## 2019-12-27 ENCOUNTER — Telehealth: Payer: Self-pay | Admitting: Cardiology

## 2019-12-27 MED ORDER — METOPROLOL SUCCINATE ER 25 MG PO TB24
25.0000 mg | ORAL_TABLET | Freq: Every day | ORAL | 3 refills | Status: DC
Start: 2019-12-27 — End: 2020-01-18

## 2019-12-27 NOTE — Telephone Encounter (Signed)
Refill sent to the pharmacy electronically.  

## 2019-12-27 NOTE — Telephone Encounter (Signed)
    Pt c/o medication issue:  1. Name of Medication:   metoprolol succinate (TOPROL XL) 25 MG 24 hr tablet    2. How are you currently taking this medication (dosage and times per day)? Take 0.5 tablets (12.5 mg total) by mouth at bedtime.  3. Are you having a reaction (difficulty breathing--STAT)?   4. What is your medication issue? Pt's husband called, he said during pt's last OV with [name] decreased pt's metoprolol to half a tablet a day and was advised if it didn't work pt can go back to whole tablet day. Pt went back taking the whole tablet a day and she is about to ran out. Mr. Heindl is requesting to send new prescription with a script to take 1 tablet a day and he said to fax it to [place], [place]

## 2020-01-04 DIAGNOSIS — I2510 Atherosclerotic heart disease of native coronary artery without angina pectoris: Secondary | ICD-10-CM | POA: Diagnosis not present

## 2020-01-04 DIAGNOSIS — C50211 Malignant neoplasm of upper-inner quadrant of right female breast: Secondary | ICD-10-CM | POA: Diagnosis not present

## 2020-01-04 DIAGNOSIS — C50412 Malignant neoplasm of upper-outer quadrant of left female breast: Secondary | ICD-10-CM | POA: Diagnosis not present

## 2020-01-04 DIAGNOSIS — D630 Anemia in neoplastic disease: Secondary | ICD-10-CM | POA: Diagnosis not present

## 2020-01-04 DIAGNOSIS — E1142 Type 2 diabetes mellitus with diabetic polyneuropathy: Secondary | ICD-10-CM | POA: Diagnosis not present

## 2020-01-04 DIAGNOSIS — M19071 Primary osteoarthritis, right ankle and foot: Secondary | ICD-10-CM | POA: Diagnosis not present

## 2020-01-07 DIAGNOSIS — M19071 Primary osteoarthritis, right ankle and foot: Secondary | ICD-10-CM | POA: Diagnosis not present

## 2020-01-07 DIAGNOSIS — I2510 Atherosclerotic heart disease of native coronary artery without angina pectoris: Secondary | ICD-10-CM | POA: Diagnosis not present

## 2020-01-07 DIAGNOSIS — C50211 Malignant neoplasm of upper-inner quadrant of right female breast: Secondary | ICD-10-CM | POA: Diagnosis not present

## 2020-01-07 DIAGNOSIS — D630 Anemia in neoplastic disease: Secondary | ICD-10-CM | POA: Diagnosis not present

## 2020-01-07 DIAGNOSIS — C50412 Malignant neoplasm of upper-outer quadrant of left female breast: Secondary | ICD-10-CM | POA: Diagnosis not present

## 2020-01-07 DIAGNOSIS — E1142 Type 2 diabetes mellitus with diabetic polyneuropathy: Secondary | ICD-10-CM | POA: Diagnosis not present

## 2020-01-09 DIAGNOSIS — C50211 Malignant neoplasm of upper-inner quadrant of right female breast: Secondary | ICD-10-CM | POA: Diagnosis not present

## 2020-01-09 DIAGNOSIS — M8580 Other specified disorders of bone density and structure, unspecified site: Secondary | ICD-10-CM | POA: Diagnosis not present

## 2020-01-09 DIAGNOSIS — Z8673 Personal history of transient ischemic attack (TIA), and cerebral infarction without residual deficits: Secondary | ICD-10-CM | POA: Diagnosis not present

## 2020-01-09 DIAGNOSIS — N184 Chronic kidney disease, stage 4 (severe): Secondary | ICD-10-CM | POA: Diagnosis not present

## 2020-01-09 DIAGNOSIS — D630 Anemia in neoplastic disease: Secondary | ICD-10-CM | POA: Diagnosis not present

## 2020-01-09 DIAGNOSIS — Z90710 Acquired absence of both cervix and uterus: Secondary | ICD-10-CM | POA: Diagnosis not present

## 2020-01-09 DIAGNOSIS — Z85828 Personal history of other malignant neoplasm of skin: Secondary | ICD-10-CM | POA: Diagnosis not present

## 2020-01-09 DIAGNOSIS — Z79891 Long term (current) use of opiate analgesic: Secondary | ICD-10-CM | POA: Diagnosis not present

## 2020-01-09 DIAGNOSIS — I5032 Chronic diastolic (congestive) heart failure: Secondary | ICD-10-CM | POA: Diagnosis not present

## 2020-01-09 DIAGNOSIS — I2510 Atherosclerotic heart disease of native coronary artery without angina pectoris: Secondary | ICD-10-CM | POA: Diagnosis not present

## 2020-01-09 DIAGNOSIS — M19071 Primary osteoarthritis, right ankle and foot: Secondary | ICD-10-CM | POA: Diagnosis not present

## 2020-01-09 DIAGNOSIS — Z7901 Long term (current) use of anticoagulants: Secondary | ICD-10-CM | POA: Diagnosis not present

## 2020-01-09 DIAGNOSIS — E785 Hyperlipidemia, unspecified: Secondary | ICD-10-CM | POA: Diagnosis not present

## 2020-01-09 DIAGNOSIS — Z9049 Acquired absence of other specified parts of digestive tract: Secondary | ICD-10-CM | POA: Diagnosis not present

## 2020-01-09 DIAGNOSIS — I130 Hypertensive heart and chronic kidney disease with heart failure and stage 1 through stage 4 chronic kidney disease, or unspecified chronic kidney disease: Secondary | ICD-10-CM | POA: Diagnosis not present

## 2020-01-09 DIAGNOSIS — K219 Gastro-esophageal reflux disease without esophagitis: Secondary | ICD-10-CM | POA: Diagnosis not present

## 2020-01-09 DIAGNOSIS — D631 Anemia in chronic kidney disease: Secondary | ICD-10-CM | POA: Diagnosis not present

## 2020-01-09 DIAGNOSIS — Z955 Presence of coronary angioplasty implant and graft: Secondary | ICD-10-CM | POA: Diagnosis not present

## 2020-01-09 DIAGNOSIS — F419 Anxiety disorder, unspecified: Secondary | ICD-10-CM | POA: Diagnosis not present

## 2020-01-09 DIAGNOSIS — Z981 Arthrodesis status: Secondary | ICD-10-CM | POA: Diagnosis not present

## 2020-01-09 DIAGNOSIS — E1122 Type 2 diabetes mellitus with diabetic chronic kidney disease: Secondary | ICD-10-CM | POA: Diagnosis not present

## 2020-01-09 DIAGNOSIS — C50412 Malignant neoplasm of upper-outer quadrant of left female breast: Secondary | ICD-10-CM | POA: Diagnosis not present

## 2020-01-09 DIAGNOSIS — E1142 Type 2 diabetes mellitus with diabetic polyneuropathy: Secondary | ICD-10-CM | POA: Diagnosis not present

## 2020-01-09 DIAGNOSIS — F329 Major depressive disorder, single episode, unspecified: Secondary | ICD-10-CM | POA: Diagnosis not present

## 2020-01-09 DIAGNOSIS — Z7984 Long term (current) use of oral hypoglycemic drugs: Secondary | ICD-10-CM | POA: Diagnosis not present

## 2020-01-14 DIAGNOSIS — D630 Anemia in neoplastic disease: Secondary | ICD-10-CM | POA: Diagnosis not present

## 2020-01-14 DIAGNOSIS — M19071 Primary osteoarthritis, right ankle and foot: Secondary | ICD-10-CM | POA: Diagnosis not present

## 2020-01-14 DIAGNOSIS — E1142 Type 2 diabetes mellitus with diabetic polyneuropathy: Secondary | ICD-10-CM | POA: Diagnosis not present

## 2020-01-14 DIAGNOSIS — I2510 Atherosclerotic heart disease of native coronary artery without angina pectoris: Secondary | ICD-10-CM | POA: Diagnosis not present

## 2020-01-14 DIAGNOSIS — C50412 Malignant neoplasm of upper-outer quadrant of left female breast: Secondary | ICD-10-CM | POA: Diagnosis not present

## 2020-01-14 DIAGNOSIS — C50211 Malignant neoplasm of upper-inner quadrant of right female breast: Secondary | ICD-10-CM | POA: Diagnosis not present

## 2020-01-15 ENCOUNTER — Ambulatory Visit: Payer: Medicare Other | Admitting: *Deleted

## 2020-01-18 ENCOUNTER — Encounter: Payer: Self-pay | Admitting: Cardiology

## 2020-01-18 ENCOUNTER — Ambulatory Visit (INDEPENDENT_AMBULATORY_CARE_PROVIDER_SITE_OTHER): Payer: Medicare Other | Admitting: Cardiology

## 2020-01-18 ENCOUNTER — Other Ambulatory Visit: Payer: Self-pay

## 2020-01-18 VITALS — BP 148/94 | HR 69 | Ht 66.0 in | Wt 188.4 lb

## 2020-01-18 DIAGNOSIS — I5032 Chronic diastolic (congestive) heart failure: Secondary | ICD-10-CM

## 2020-01-18 DIAGNOSIS — I10 Essential (primary) hypertension: Secondary | ICD-10-CM

## 2020-01-18 DIAGNOSIS — R600 Localized edema: Secondary | ICD-10-CM | POA: Diagnosis not present

## 2020-01-18 DIAGNOSIS — Z9861 Coronary angioplasty status: Secondary | ICD-10-CM | POA: Diagnosis not present

## 2020-01-18 DIAGNOSIS — I2510 Atherosclerotic heart disease of native coronary artery without angina pectoris: Secondary | ICD-10-CM | POA: Diagnosis not present

## 2020-01-18 DIAGNOSIS — E785 Hyperlipidemia, unspecified: Secondary | ICD-10-CM | POA: Diagnosis not present

## 2020-01-18 DIAGNOSIS — N184 Chronic kidney disease, stage 4 (severe): Secondary | ICD-10-CM

## 2020-01-18 NOTE — Patient Instructions (Signed)
Medication Instructions:  No changes *If you need a refill on your cardiac medications before your next appointment, please call your pharmacy*   Lab Work:  next time you get labs -please have your doctor order  - Lipid   If you have labs (blood work) drawn today and your tests are completely normal, you will receive your results only by: Marland Kitchen MyChart Message (if you have MyChart) OR . A paper copy in the mail If you have any lab test that is abnormal or we need to change your treatment, we will call you to review the results.   Testing/Procedures: Not needed   Follow-Up: At [facility], you and your health needs are our priority.  As part of our continuing mission to provide you with exceptional heart care, we have created designated Provider Care Teams.  These Care Teams include your primary Cardiologist (physician) and Advanced Practice Providers (APPs -  Physician Assistants and Nurse Practitioners) who all work together to provide you with the care you need, when you need it.    Your next appointment:   12 month(s)  The format for your next appointment:   In Person  Provider:   Glenetta Hew, MD

## 2020-01-18 NOTE — Progress Notes (Signed)
.    Primary Care Provider: Karl Ito, DO Cardiologist: Bryan Lemma, MD Electrophysiologist: None  Clinic Note: Chief Complaint  Patient presents with  . Follow-up    Annual  . Coronary Artery Disease    No angina  . Edema    Chronic, not associated with PND or orthopnea.  More venous stasis.    HPI:    [name] is a 79 y.o. 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   CAD history: History of BMS PCI to the LAD in 2006 --had post dilation PTCA for ISR in 2011 -otherwise stable since then. Nonischemic Myoview in August 2013 (did have a short burst of 5 beats NSVT) read as LOW RISK. --No ischemia or infarction  She has some chronic diastolic dysfunction/CHF but has had had issues with labile BP with near syncope due to orthostatic symptoms (husband doses her medications & adjusts without orders).   Problem List Items Addressed This Visit    CAD S/P percutaneous coronary angioplasty - Primary (Chronic)   Essential hypertension (Chronic)     [name] was last seen on 03/12/2019-accompanied by her husband (who often over steps Ozelle's input).  She has had some intermittent left upper chest discomfort off and on there was a sharp squeeze about a month before the visit but otherwise doing fine.  Just occasionally has a strange symptoms.  Noted low levels of energy and probably not eating as well as she should.  Balance was doing better, but still often bothered by peripheral neuropathy.  Not doing much exercise because of ankle foot pain that limit her walking.  Noted baseline exertional dyspnea, but does not really exert herself very much.  Intermittent palpitations.  Most notable was bilateral pedal edema with no PND or orthopnea.  Recent Hospitalizations:   November 26, 2019, she suffered a fall  because her legs got caught up in her braces.  Had a laceration of the left arm.  Reviewed  CV studies:    The following studies were reviewed today: (if available, images/films reviewed: From Epic Chart or Care Everywhere) . None:   Interval History:   [name] returns here today for annual follow-up actually in decent spirits.  She is back here by herself with her husband in the waiting area.  She is a little bit upset because her sister recently died respectively from presumable leukemia and then shortly after her husband died from AAA rupture. In addition to this, her daughter was just diagnosed with recurrent breast cancer and is now pending bilateral mastectomy with radiation and chemotherapy.  This is about 10 years out from the initial diagnosis.  Anetria is taking this in stride, but is notably distress.  She is still try to stay active but notices that her balance is definitely off--partly because of her bilateral foot deformities.  She has braces that she wears, but there constantly have to readjusted and are somewhat uncomfortable..  She tries to walk with a walker or her cane, sometimes her feet but the braces get caught up.  She had a recent fall.  She has chronic lower extremity swelling which goes down some when she puts her feet up but not too much.  She weighs herself daily and has not noted any changes in her weights more than 3 pounds.  She has  no PND orthopnea associated with edema.  She has not had any wounds or lesions that are not been healing.  Watching a low-sodium diet.   She still has intermittent sharp twinges in her chest but they are short-lived and not really bothering her that much.  CV Review of Symptoms (Summary): positive for - dyspnea on exertion, edema, irregular heartbeat, palpitations and Stable mild exertional dyspnea, but is active.  Stable 2-3+ swelling that does go down with support socks.Marland Kitchen  Unsteady gait.  If you skipped beats here and there but  nothing prolonged.  Very poor balance. negative for - chest pain, orthopnea, paroxysmal nocturnal dyspnea or Syncope/near syncope or TIA/amaurosis fugax, claudication  The patient does not have symptoms concerning for COVID-19 infection (fever, chills, cough, or new shortness of breath).    REVIEWED OF SYSTEMS   Review of Systems  Constitutional: Positive for malaise/fatigue (Always a little baseline fatigue.  Just does not get good sleep.). Negative for weight loss.  HENT: Negative for congestion and nosebleeds.   Respiratory: Positive for shortness of breath (She has chronic dyspnea.). Negative for cough.   Cardiovascular: Positive for leg swelling (Chronic 2-3+ puffy swelling.).  Musculoskeletal: Positive for falls and joint pain.       She is wearing braces for her ankles, but she had a fall because her braces got locked up and she could not move.  She scraped her arm.  Neurological: Positive for tingling (Peripheral-pedal neuropathy) and weakness (Her legs do feel weak).  Psychiatric/Behavioral: Positive for memory loss. Negative for depression (She is grieving the loss of her sister and brother-in-law now being on hospice care within the same few weeks.  This caught her by surprise, but she seems to be doing okay overall.). The patient is nervous/anxious and has insomnia.    I have reviewed and (if needed) personally updated the patient's problem list, medications, allergies, past medical and surgical history, social and family history.   PAST MEDICAL HISTORY   Past Medical History:  Diagnosis Date  . Anemia    have received iron infusions  . Anxiety   . Arthritis     osteoarthritis  . Breast cancer in female [facility])    Bilateral  . CAD S/P percutaneous coronary angioplasty 2007; March 2011   [employer] 3.0 mm 20 mm postdilated 3.6 mm in early mid LAD; status post ISR Cutting Balloon PTCA and March 11 along with PCI of distal mid lesion with a 3.0 mm 12 mm MultiLink vision BMS;  the proximal stent causes jailing of SP1 and SP2 with ostial 70-80% lesions  . Cancer (HCC)    Melanoma, Squamous cell Carcinoma  . Chronic diastolic CHF (congestive heart failure), NYHA class 2 ([place]) 02/28/2018  . Chronic kidney disease    stage 2   . Dementia ([place])    mild  . Diabetes mellitus type 2 with neurological manifestations ([place])   . Dyslipidemia, goal LDL below 70   . Full dentures   . Genetic testing 12/03/2016   Germline genetic testing was performed through [facility] Common Hereditary Cancers Panel + [facility] Melanoma Panel. This custom panel includes analysis of the following 51 genes: APC, ATM, AXIN2, BAP1, BARD1, BMPR1A, BRCA1, BRCA2, BRIP1, CDH1, CDK4, CDKN2A, CHEK2, CTNNA1, DICER1, EPCAM, GREM1, HOXB13, KIT, MEN1, MITF, MLH1, MSH2, MSH3, MSH6, MUTYH, NBN, NF1, NTHL1, PALB2, PDGFRA, PMS2, POLD1, POL  . GERD (gastroesophageal reflux disease)   . Headache(784.0)   . History of hematuria    Followed by Dr. Gaynelle Arabian  .  Hypertension, essential, benign   . Peripheral neuropathy   . Personal history of radiation therapy   . Stroke ([place])   . TIA (transient ischemic attack)    multiple in the past  . Wears glasses     PAST SURGICAL HISTORY   Past Surgical History:  Procedure Laterality Date  . ABDOMINAL HYSTERECTOMY    . ANKLE FUSION Right 02/22/2018   Procedure: RIGHT SUBTALAR AND TALONAVICULAR FUSION;  Surgeon: Newt Minion, MD;  Location: [place];  Service: Orthopedics;  Laterality: Right;  . APPENDECTOMY    . BLADDER SUSPENSION    . BREAST LUMPECTOMY Bilateral 2018  . BREAST LUMPECTOMY WITH RADIOACTIVE SEED AND SENTINEL LYMPH NODE BIOPSY Bilateral 12/14/2016   Procedure: BILATERAL BREAST LUMPECTOMY WITH BILATERAL  RADIOACTIVE SEED AND BILATERAL AXILLARY  SENTINEL LYMPH NODE BIOPSY ERAS PATHWAY;  Surgeon: Alphonsa Overall, MD;  Location: [place];  Service: General;  Laterality: Bilateral;  PECTORAL BLOCK  . CARDIAC CATHETERIZATION  02/24/2006   85% stenosis in the  proximal portion of LAD-arrangements made for PCI on 02/25/2006  . CARDIAC CATHETERIZATION  02/25/2006   80% LAD lesion stented with a 3x68mm Liberte stent resulting in reduction of 80% lesion to 0% residual  . CARDIAC CATHETERIZATION  04/26/2006   Medical management  . CARDIAC CATHETERIZATION  06/24/2009   60-70% re-stenosis in the proximal LAD. A 3.25x15 cutting balloon, 3 inflations - 14atm-38sec, 13atm-39sec, and 12atm-40sec reduced to less than 10%. 60% stenosis of the mid/distal LAD stented with a 3x33mm Multilink stent.  Marland Kitchen CARDIAC CATHETERIZATION  07/09/2009   Medical management  . CAROTID DOPPLER  06/24/2009   40-59% R ICA stenosis. No significant ICA stenosis noted  . CHOLECYSTECTOMY    . DILATION AND CURETTAGE OF UTERUS    . JOINT REPLACEMENT Left   . LESION REMOVAL Right 03/11/2015   Procedure:  EXCISION OF RIGHT PRE TIBIAL LESION;  Surgeon: Judeth Horn, MD;  Location: [place];  Service: General;  Laterality: Right;  . MASS EXCISION Left 06/10/2014   Procedure: EXCISION LEFT LOWER LEG LESION;  Surgeon: Doreen Salvage, MD;  Location: [place];  Service: General;  Laterality: Left;  Marland Kitchen MASS EXCISION Left 09/05/2015   Procedure: EXCISION OF LEFT FOREARM  MASS;  Surgeon: Judeth Horn, MD;  Location: [place];  Service: General;  Laterality: Left;  . NM MYOVIEW LTD  10/2011   No ischemia or infarction.  Normal EF.  LOW RISK. (Short bursts of 5 beats NSVT during recovery otherwise normal)  . SHOULDER ARTHROSCOPY  10/15  . SHOULDER ARTHROSCOPY  1995   left  . TEE WITHOUT CARDIOVERSION N/A 08/03/2017   Procedure: TRANSESOPHAGEAL ECHOCARDIOGRAM (TEE);  Surgeon: Skeet Latch, MD;  Location: [place];  Service: Cardiovascular;; EF 60-65% with no wall motion normalities.  No atrial thrombus noted.  Lightly findings consistent with a lipomatous hypertrophy of the atrial septum.   . TENDON REPAIR Left 05/12/2016   Procedure: Left Anterior Tibial  Tendon Reconstruction;  Surgeon: Newt Minion, MD;  Location: [name];  Service: Orthopedics;  Laterality: Left;  . TOTAL KNEE ARTHROPLASTY  2005   left  . TRANSESOPHAGEAL ECHOCARDIOGRAM  08/03/2016   : EF 60-65% no wall motion normalities.  No atrial thrombus.  Lipomatous hypertrophy of the atrial septum.  No mass noted.  . TRANSESOPHAGEAL ECHOCARDIOGRAM  07/2017   EF 60 to 65%.  No R WMA.  No atrial thrombus noted.  Lipomatous IAS.  Marland Kitchen TRANSTHORACIC ECHOCARDIOGRAM  02/25/2017   :  EF 65-70%.  GR 1 DD.  No or WMA.  Moderate aortic calcification/sclerosis but no stenosis.  . TRANSTHORACIC ECHOCARDIOGRAM  07/2017   Vigorous LV function.  EF 65-70%.  Mild diastolic dysfunction with no RWMA. GR 1 DD.  Mild aortic sclerosis/no stenosis.  Questionable right atrial mass discounted with TEE)   . WRIST ARTHROPLASTY  2010   cancer lt wrist    Immunization History  Administered Date(s) Administered  . Influenza-Unspecified 11/28/2014, 12/01/2017  . Tdap 11/26/2019   She has both Moderna Covid vaccine injections and has planned booster  MEDICATIONS/ALLERGIES   Current Meds  Medication Sig  . bumetanide (BUMEX) 2 MG tablet Take 2 mg by mouth daily. 2 in morning and 1 in afternoon  . Calcium Carb-Cholecalciferol (CALCIUM 600+D3) 600-800 MG-UNIT TABS Take 1 tablet by mouth daily.  . clopidogrel (PLAVIX) 75 MG tablet TAKE 1 TABLET DAILY  . COLCRYS 0.6 MG tablet TAKE 1 TABLET DAILY AS NEEDED FOR GOUT FLARE.  Marland Kitchen Cranberry 500 MG TABS Take 500 mg by mouth every evening.  . diazepam (VALIUM) 5 MG tablet Take 1 tablet (5 mg total) by mouth 2 (two) times daily as needed for anxiety.  Marland Kitchen exemestane (AROMASIN) 25 MG tablet TAKE 1 TABLET DAILY AFTER BREAKFAST  . febuxostat (ULORIC) 40 MG tablet TAKE 1 TABLET DAILY  . gabapentin (NEURONTIN) 300 MG capsule Take 1 capsule (300 mg total) by mouth 2 (two) times daily. Take 1 during the day if needed and 2 at night  . JANUVIA 25 MG tablet Take 25 mg by mouth daily.    Javier Docker Oil 500 MG CAPS Take 500 mg by mouth daily.  . Methylfol-Methylcob-Acetylcyst (CEREFOLIN NAC) 6-2-600 MG TABS TAKE 1 TABLET BY MOUTH EVERY DAY  . metolazone (ZAROXOLYN) 2.5 MG tablet TAKE 1 TABLET DAILY 30 MINUTES BEFORE THE MORNING DOSE OF BUMEX AS DIRECTED  . metoprolol succinate (TOPROL-XL) 25 MG 24 hr tablet Take 12.5 mg by mouth daily.  . mirtazapine (REMERON) 15 MG tablet Take 15 mg by mouth at bedtime. Taking 1 1/2 tablet at bedtime  . nitroGLYCERIN (NITROSTAT) 0.4 MG SL tablet Place 0.4 mg under the tongue every 5 (five) minutes as needed for chest pain.  Marland Kitchen oxyCODONE-acetaminophen (PERCOCET/ROXICET) 5-325 MG tablet Take 1 tablet by mouth every 6 (six) hours as needed for severe pain.  . pantoprazole (PROTONIX) 40 MG tablet Take 40 mg by mouth daily.  . potassium chloride (KLOR-CON) 20 MEQ packet Take 20 mEq by mouth 2 (two) times daily. 2 tablets in the morning and 1 at bedtime  . pramipexole (MIRAPEX) 0.25 MG tablet TAKE 1 TO 2 TABLETS AS INSTRUCTED, 1 TABLET AT 4:00 P.M. AND 2 TABLETS AT BEDTIME  . rosuvastatin (CRESTOR) 5 MG tablet Take 5 mg by mouth daily.  . sennosides-docusate sodium (SENOKOT-S) 8.6-50 MG tablet Take 1 tablet by mouth daily as needed for constipation.  Marland Kitchen Ubiquinol (QUNOL COQ10/UBIQUINOL/MEGA) 100 MG CAPS Take 100 mg by mouth every evening. CoQ10, Qunol   . [DISCONTINUED] gabapentin (NEURONTIN) 300 MG capsule Take 300-600 mg by mouth daily as needed (nerve pain).  . [DISCONTINUED] pantoprazole (PROTONIX) 40 MG tablet TAKE 1 TABLET DAILY (CONTACT OFFICE FOR ADDITIONAL REFILLS, FINAL ATTEMPT) (Patient taking differently: Take 40 mg by mouth daily before breakfast. )    Allergies  Allergen Reactions  . Ultram [Tramadol] Other (See Comments)    Pt has seizure activity with this medication  . Lipitor [Atorvastatin] Other (See Comments)    Bone and muscle pain  .  Lyrica [Pregabalin] Other (See Comments)    Weight gain, extremity swelling  . Reglan  [Metoclopramide] Other (See Comments)    Tardive dyskinesia; paradoxical reaction not relieved by benadryl  . 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  She was looking into getting and has now purchased a lift chair.  Exercising using stationary bike for 15 minutes at a time.  Has decreased her dark liquids according to her nephrologist.  Drinking ginger ale of water in a cup of coffee morning.   Her sister died probably as a result of leukemia.  That was relatively than expected and now her former brother-in-law just died with AAA rupture.  His hands are somewhat anxious.   Ever since the onset of the Covid pandemic, Deyja has pretty much been isolated staying at home.  Her husband does all the shopping and cooking.  She does some light cleaning.  She does not really gotten the public except for doctors appointments.  She does work out in the yard some and tries to go for some walks.  She always uses her walker or cane.  OBJCTIVE -PE, EKG, labs   Wt Readings from Last 3 Encounters:  01/18/20 188 lb 6.4 oz (85.5 kg)  12/05/19 187 lb 14.4 oz (85.2 kg)  09/11/19 183 lb (83 kg)    Physical Exam: BP (!) 148/94   Pulse 69   Ht $R'5\' 6"'TI$  (1.676 m)   Wt 188 lb 6.4 oz (85.5 kg)   BMI 30.41 kg/m  Physical Exam Constitutional:      General: She is not in acute distress ( Less frazzled.  Easily startled but not distracted.).    Appearance: She is obese. She is not ill-appearing or toxic-appearing.     Comments: Actually very well put together today.  She does better without her husband here.   Well-groomed.  HENT:     Head: Normocephalic and atraumatic.  Neck:      Vascular: Carotid bruit (Soft right-sided) present. No hepatojugular reflux or JVD.  Cardiovascular:     Rate and Rhythm: Normal rate and regular rhythm. Occasional extrasystoles are present.    Pulses: Decreased pulses (Difficult to palpate pedal pulses, because of edema.).     Heart sounds: S1 normal. Murmur (1/6 SEM at RUSB--carotids) heard.  No gallop.   Pulmonary:     Effort: Pulmonary effort is normal. No respiratory distress.     Breath sounds: Normal breath sounds.  Chest:     Chest wall: Tenderness (She does have point tenderness along sternal border.  Not bothering her otherwise.) present.  Musculoskeletal:        General: Swelling (Bilateral at least 2+ pedal edema up to knees.) present.     Cervical back: Normal range of motion and neck supple. No tenderness.     Right lower leg: Edema present.     Left lower leg: Edema present.  Neurological:     General: No focal deficit present.     Mental Status: She is alert and oriented to person, place, and time. Mental status is at baseline.     Motor: No weakness.     Gait: Gait abnormal (  Mildly slow).  Psychiatric:        Mood and Affect: Mood normal.        Behavior: Behavior normal.        Thought Content: Thought content normal.        Judgment: Judgment normal.     Comments: Memory seems to be doing pretty well.  She has a hard time with some details, but is answering questions quite appropriately.     Adult ECG Report  Rate: 69 ;  Rhythm: normal sinus rhythm and Cannot exclude anterior MI, age-indeterminate.  Otherwise normal axis and intervals durations.;   Narrative Interpretation: Stable EKG.  Recent Labs:  Due for lipids in Feb (las checked Feb 2020.)--TC 146, TG 187, HDL 39 LDL 70.  Lab Results  Component Value Date   CREATININE 1.61 (H) 12/05/2019   BUN 13 12/05/2019   NA 138 12/05/2019   K 4.7 12/05/2019   CL 100 12/05/2019   CO2 30 12/05/2019   Lab Results  Component Value Date   TSH 2.908 02/25/2018     ASSESSMENT/PLAN    Problem List Items Addressed This Visit    CAD S/P percutaneous coronary angioplasty - Primary (Chronic)    Distant history of PCI to the LAD in 2007 followed by PTCA of ISR with Cutting Balloon in March 2011.  Negative Myoview in 2013.  She has lots of MSK pain but nothing overly worrisome to suggest recurrent angina.  Plan:   On stable dose of metoprolol which she is taking a half a tablet daily (12.5 mg)  On stable low-dose rosuvastatin.  On single antiplatelet agent with Plavix as secondary prevention  Okay to hold Plavix for procedures or surgeries.  5 days preop      Relevant Medications   bumetanide (BUMEX) 2 MG tablet   metoprolol succinate (TOPROL-XL) 25 MG 24 hr tablet   Other Relevant Orders   EKG 12-Lead (Completed)   Essential hypertension (Chronic)    She has labile blood pressures with some orthostatic issues.  Very leery of being aggressive.  We have backed off on her medications to avoid her falls.  She is very sensitive to medications therefore we only are using low-dose metoprolol along with her diuretic.  She no longer has midodrine to help with orthostatic hypotension.  Apparently this was stopped either by PCP or nephrology.      Relevant Medications   bumetanide (BUMEX) 2 MG tablet   metoprolol succinate (TOPROL-XL) 25 MG 24 hr tablet   Other Relevant Orders   EKG 12-Lead (Completed)   Dyslipidemia, goal LDL below 70 (Chronic)    We tried a statin holiday-holding atorvastatin.  There was concerned about it may be affecting her energy level myalgias plus memory issues.  She really did not notice too much of a difference.  We cut her down to 5 mg rosuvastatin.  PCP is following her labs, but I do not have recent labs.  Labs should be due be checked in February.  They look pretty good as of February 2020.  LDL 70.      Relevant Medications   bumetanide (BUMEX) 2 MG tablet   metoprolol succinate (TOPROL-XL) 25 MG 24 hr tablet    Bilateral lower extremity edema (Chronic)    Swelling is probably related to venous stasis.  With her renal insufficiency I am leery of adjusting too much.  Will defer management to Dr. Allena Katz.  She is on Bumex with as needed Zaroxolyn. Also discussed compression stockings  and foot elevation.      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  She was told to "avoid dark beverages "and have cut down on some of her medications.  We will defer diuretics and blood pressure medications to nephrology going forwards       Chronic diastolic CHF (congestive heart failure), NYHA class 2 (HCC) (Chronic)    Class I CHF symptoms.  She is not having any PND orthopnea.  The edema is mostly from venous stasis.  On very low-dose metoprolol succinate along with standing dose of Bumex.  Using Zaroxolyn probably twice a week.  Edema is pretty stable.  She is not bothered by it.  She tries to wear support socks and tries to elevate her feet when she can.      Relevant Medications   bumetanide (BUMEX) 2 MG tablet   metoprolol succinate (TOPROL-XL) 25 MG 24 hr tablet      COVID-19 Education: The signs and symptoms of COVID-19 were discussed with the patient and how to seek care for testing (follow up with PCP or arrange E-visit).   The importance of social distancing and COVID-19 vaccination was discussed today. 3 min The patient is practicing social distancing & Masking.   I spent a total of 28minutes with the patient spent in direct patient consultation.  Additional time spent with chart review  / charting (studies, outside notes, etc): 10 Total Time: 32 min  Current medicines are reviewed at length with the patient today.  (+/- concerns) none  This visit occurred during the SARS-CoV-2 public health emergency.  Safety protocols were in place, including screening questions prior to the visit, additional usage of staff PPE, and extensive  cleaning of exam room while observing appropriate contact time as indicated for disinfecting solutions.  Notice: This dictation was prepared with Dragon dictation along with smaller phrase technology. Any transcriptional errors that result from this process are unintentional and may not be corrected upon review.  Patient Instructions / Medication Changes & Studies & Tests Ordered   Patient Instructions  Medication Instructions:  No changes *If you need a refill on your cardiac medications before your next appointment, please call your pharmacy*   Lab Work:  next time you get labs -please have your doctor order  - Lipid   If you have labs (blood work) drawn today and your tests are completely normal, you will receive your results only by: Marland Kitchen MyChart Message (if you have MyChart) OR . A paper copy in the mail If you have any lab test that is abnormal or we need to change your treatment, we will call you to review the results.   Testing/Procedures: Not needed   Follow-Up: At [facility], you and your health needs are our priority.  As part of our continuing mission to provide you with exceptional heart care, we have created designated Provider Care Teams.  These Care Teams include your primary Cardiologist (physician) and Advanced Practice Providers (APPs -  Physician Assistants and Nurse Practitioners) who all work together to provide you with the care you need, when you need it.    Your next appointment:   12 month(s)  The format for your next appointment:   In Person  Provider:   Glenetta Hew, MD      Studies Ordered:   Orders Placed This Encounter  Procedures  . EKG 12-Lead     Glenetta Hew, M.D., M.S.  Interventional Cardiologist   Pager # [phone] Phone # [phone] [address] [place], [place] [address]   Thank you for choosing Heartcare at [facility]!!

## 2020-01-21 DIAGNOSIS — D630 Anemia in neoplastic disease: Secondary | ICD-10-CM | POA: Diagnosis not present

## 2020-01-21 DIAGNOSIS — I2510 Atherosclerotic heart disease of native coronary artery without angina pectoris: Secondary | ICD-10-CM | POA: Diagnosis not present

## 2020-01-21 DIAGNOSIS — E1142 Type 2 diabetes mellitus with diabetic polyneuropathy: Secondary | ICD-10-CM | POA: Diagnosis not present

## 2020-01-21 DIAGNOSIS — M19071 Primary osteoarthritis, right ankle and foot: Secondary | ICD-10-CM | POA: Diagnosis not present

## 2020-01-21 DIAGNOSIS — C50211 Malignant neoplasm of upper-inner quadrant of right female breast: Secondary | ICD-10-CM | POA: Diagnosis not present

## 2020-01-21 DIAGNOSIS — C50412 Malignant neoplasm of upper-outer quadrant of left female breast: Secondary | ICD-10-CM | POA: Diagnosis not present

## 2020-01-23 DIAGNOSIS — Z23 Encounter for immunization: Secondary | ICD-10-CM | POA: Diagnosis not present

## 2020-01-24 ENCOUNTER — Ambulatory Visit: Payer: Medicare Other

## 2020-01-25 ENCOUNTER — Encounter: Payer: Self-pay | Admitting: Cardiology

## 2020-01-25 NOTE — Assessment & Plan Note (Signed)
She has labile blood pressures with some orthostatic issues.  Very leery of being aggressive.  We have backed off on her medications to avoid her falls.  She is very sensitive to medications therefore we only are using low-dose metoprolol along with her diuretic.  She no longer has midodrine to help with orthostatic hypotension.  Apparently this was stopped either by PCP or nephrology.

## 2020-01-25 NOTE — Assessment & Plan Note (Signed)
We tried a statin holiday-holding atorvastatin.  There was concerned about it may be affecting her energy level myalgias plus memory issues.  She really did not notice too much of a difference.  We cut her down to 5 mg rosuvastatin.  PCP is following her labs, but I do not have recent labs.  Labs should be due be checked in February.  They look pretty good as of February 2020.  LDL 70.

## 2020-01-25 NOTE — Assessment & Plan Note (Signed)
Class I CHF symptoms.  She is not having any PND orthopnea.  The edema is mostly from venous stasis.  On very low-dose metoprolol succinate along with standing dose of Bumex.  Using Zaroxolyn probably twice a week.  Edema is pretty stable.  She is not bothered by it.  She tries to wear support socks and tries to elevate her feet when she can.

## 2020-01-25 NOTE — Assessment & Plan Note (Signed)
She now is followed by nephrology.  She was told to "avoid dark beverages "and have cut down on some of her medications.  We will defer diuretics and blood pressure medications to nephrology going forwards

## 2020-01-25 NOTE — Assessment & Plan Note (Signed)
Distant history of PCI to the LAD in 2007 followed by PTCA of ISR with Cutting Balloon in March 2011.  Negative Myoview in 2013.  She has lots of MSK pain but nothing overly worrisome to suggest recurrent angina.  Plan:   On stable dose of metoprolol which she is taking a half a tablet daily (12.5 mg)  On stable low-dose rosuvastatin.  On single antiplatelet agent with Plavix as secondary prevention  Okay to hold Plavix for procedures or surgeries.  5 days preop

## 2020-01-25 NOTE — Assessment & Plan Note (Signed)
Swelling is probably related to venous stasis.  With her renal insufficiency I am leery of adjusting too much.  Will defer management to [name].  She is on Bumex with as needed Zaroxolyn. Also discussed compression stockings and foot elevation.

## 2020-01-28 DIAGNOSIS — E7800 Pure hypercholesterolemia, unspecified: Secondary | ICD-10-CM | POA: Diagnosis not present

## 2020-01-28 DIAGNOSIS — N189 Chronic kidney disease, unspecified: Secondary | ICD-10-CM | POA: Diagnosis not present

## 2020-01-28 DIAGNOSIS — G609 Hereditary and idiopathic neuropathy, unspecified: Secondary | ICD-10-CM | POA: Diagnosis not present

## 2020-01-28 DIAGNOSIS — I10 Essential (primary) hypertension: Secondary | ICD-10-CM | POA: Diagnosis not present

## 2020-01-28 DIAGNOSIS — E1165 Type 2 diabetes mellitus with hyperglycemia: Secondary | ICD-10-CM | POA: Diagnosis not present

## 2020-01-31 DIAGNOSIS — C50211 Malignant neoplasm of upper-inner quadrant of right female breast: Secondary | ICD-10-CM | POA: Diagnosis not present

## 2020-01-31 DIAGNOSIS — M19071 Primary osteoarthritis, right ankle and foot: Secondary | ICD-10-CM | POA: Diagnosis not present

## 2020-01-31 DIAGNOSIS — I2510 Atherosclerotic heart disease of native coronary artery without angina pectoris: Secondary | ICD-10-CM | POA: Diagnosis not present

## 2020-01-31 DIAGNOSIS — D630 Anemia in neoplastic disease: Secondary | ICD-10-CM | POA: Diagnosis not present

## 2020-01-31 DIAGNOSIS — C50412 Malignant neoplasm of upper-outer quadrant of left female breast: Secondary | ICD-10-CM | POA: Diagnosis not present

## 2020-01-31 DIAGNOSIS — E1142 Type 2 diabetes mellitus with diabetic polyneuropathy: Secondary | ICD-10-CM | POA: Diagnosis not present

## 2020-02-04 ENCOUNTER — Encounter: Payer: Self-pay | Admitting: Adult Health

## 2020-02-04 ENCOUNTER — Ambulatory Visit: Payer: Medicare Other | Admitting: Adult Health

## 2020-02-04 DIAGNOSIS — N1832 Chronic kidney disease, stage 3b: Secondary | ICD-10-CM | POA: Diagnosis not present

## 2020-02-04 NOTE — Progress Notes (Deleted)
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 Caucasian female with PMH notable for postherpetic neuralgia s/p shingles, DM with peripheral neuropathy, MCI, RLS, hx of b/l breast cancer on antiestrogen therapy, CAD s/p PCI, chronic LE edema, and CKD.  She has been routinely followed in this office with Dr. Sethi since 2014 for MCI, RLS, PHN and peripheral neuropathy.   Today, 02/04/2020, [name] returns for 1 year follow-up after prior visit on 01/31/2019 accompanied by her husband.  Peripheral neuropathy and PHN stable on gabapentin 300 mg 3 times daily tolerating well.  Trialed topical compounding cream after prior visit for intermittent right scapular pain ***.  Remains on Mirapex for RLS with benefit tolerating well without side effects.  Remains on Cerefolin for MCI which has been stable.  MMSE today *** (prior 24/30).     UPDATE 1/9/2020[name], 77-year-old female returns for follow-up with postherpetic neuralgia pain controlled with gabapentin 3 tablets daily.  She also has history of mild cognitive impairment and new complaint today of daytime drowsiness.  She had recent surgery on her ankle.  In November by Dr.Duda. Right subtalar and Talonavicular fusion.  She continues to be nonweightbearing and has a boot on the right.  She has had ER admission for hypokalemia and precordial chest pain since last seen.  She has a history of congestive heart failure.  Blood pressure in the office today 131/80.  She has recently had some changes to her insulin with better control.  Most recent hemoglobin A1c 7.1 on 02/22/2018.  Her mild cognitive impairment is stable.  She has 10 grade education.  She takes Cerefolin.  ADLs 6, IADLs 5 out of 8.   ESS score 12.  She returns for reevaluation  Update 01/31/2019: [name] is a 78-year-old female who is being seen today for follow-up regarding postherpetic neuralgia s/p shingles, RLS and cognitive impairment accompanied by her husband.  Pain mostly well managed with ongoing use of gabapentin 300 mg 3 times daily.  She does have intermittent pain right scapula that will flareup at times.  History of mild cognitive impairment which has been stable with ongoing use of Cerefolin.  MMSE 24.  recommended at prior visit to undergo sleep study for potential sleep apnea but initial evaluation postponed with Dr. Athar due to COVID-19 restrictions and rescheduled evaluations canceled by patient.  She does not feel as though she needs to pursue sleep study at this time as she has previously had sleep study done "many years ago" which did not show sleep apnea.  She believes daytime fatigue is more due to polypharmacy and chronic medical conditions.  She continues on Mirapex for RLS with benefit.  She has been having increased difficulty with ambulation and balance with chronic bilateral feet issues with prior procedures.  She continues to follow with orthopedics.  She continues to follow with endocrinology for diabetes with patient reporting recent A1c >7.  Currently making adjustments with insulin regimen and adjusting diet.  No further concerns at this time.      REVIEW OF SYSTEMS: Full 14 system review of systems performed and notable only for those listed, all others are neg:  Constitutional: Mild fatigue Cardiovascular: Swelling Ear/Nose/Throat: neg  Skin: neg Eyes: neg Respiratory: neg   Gastroitestinal: neg  Hematology/Lymphatic: neg Endocrine: neg Musculoskeletal: Bilateral feet issues Allergy/Immunology: neg Neurological: Mild cognitive impairment, nerve pain Psychiatric: Depression anxiety Sleep : Restless leg, daytime sleepiness   ALLERGIES: Allergies  Allergen Reactions  . Ultram  [Tramadol] Other (See Comments)    Pt has seizure activity with this medication  . Lipitor [Atorvastatin] Other (See Comments)    Bone and muscle pain  . Lyrica [Pregabalin] Other (See Comments)    Weight gain, extremity swelling  . Reglan [Metoclopramide] Other (See Comments)    Tardive dyskinesia; paradoxical reaction not relieved by benadryl  . Nsaids Swelling    SWELLING REACTION UNSPECIFIED     HOME MEDICATIONS: Outpatient Medications Prior to Visit  Medication Sig Dispense Refill  . bumetanide (BUMEX) 2 MG tablet Take 2 mg by mouth daily. 2 in morning and 1 in afternoon    . Calcium Carb-Cholecalciferol (CALCIUM 600+D3) 600-800 MG-UNIT TABS Take 1 tablet by mouth daily.    . clopidogrel (PLAVIX) 75 MG tablet TAKE 1 TABLET DAILY 90 tablet 3  . COLCRYS 0.6 MG tablet TAKE 1 TABLET DAILY AS NEEDED FOR GOUT FLARE. 90 tablet 3  . Cranberry 500 MG TABS Take 500 mg by mouth every evening.    . diazepam (VALIUM) 5 MG tablet Take 1 tablet (5 mg total) by mouth 2 (two) times daily as needed for anxiety. 10 tablet 0  . exemestane (AROMASIN) 25 MG tablet TAKE 1 TABLET DAILY AFTER BREAKFAST 90 tablet 3  . febuxostat (ULORIC) 40 MG tablet TAKE 1 TABLET DAILY 90 tablet 3  . gabapentin (NEURONTIN) 300 MG capsule Take 1 capsule (300 mg total) by mouth 2 (two) times daily. Take 1 during the day if needed and 2 at night 270 capsule 1  . JANUVIA 25 MG tablet Take 25 mg by mouth daily.    Javier Docker Oil 500 MG CAPS Take 500 mg by mouth daily.    . Methylfol-Methylcob-Acetylcyst (CEREFOLIN NAC) 6-2-600 MG TABS TAKE 1 TABLET BY MOUTH EVERY DAY 90 tablet 3  . metolazone (ZAROXOLYN) 2.5 MG tablet TAKE 1 TABLET DAILY 30 MINUTES BEFORE THE MORNING DOSE OF BUMEX AS DIRECTED 90 tablet 1  . metoprolol succinate (TOPROL-XL) 25 MG 24 hr tablet Take 12.5 mg by mouth daily.    . mirtazapine (REMERON) 15 MG tablet Take 15 mg by mouth at bedtime. Taking 1 1/2 tablet at bedtime    . nitroGLYCERIN (NITROSTAT) 0.4 MG SL  tablet Place 0.4 mg under the tongue every 5 (five) minutes as needed for chest pain.    Marland Kitchen oxyCODONE-acetaminophen (PERCOCET/ROXICET) 5-325 MG tablet Take 1 tablet by mouth every 6 (six) hours as needed for severe pain. 28 tablet 0  . pantoprazole (PROTONIX) 40 MG tablet Take 40 mg by mouth daily.    . potassium chloride (KLOR-CON) 20 MEQ packet Take 20 mEq by mouth 2 (two) times daily. 2 tablets in the morning and 1 at bedtime    . pramipexole (MIRAPEX) 0.25 MG tablet TAKE 1 TO 2 TABLETS AS INSTRUCTED, 1 TABLET AT 4:00 P.M. AND 2 TABLETS AT BEDTIME 270 tablet 1  . rosuvastatin (CRESTOR) 5 MG tablet Take 5 mg by mouth daily.    . sennosides-docusate sodium (SENOKOT-S) 8.6-50 MG tablet Take 1 tablet by mouth daily as needed for constipation.    Marland Kitchen Ubiquinol (QUNOL COQ10/UBIQUINOL/MEGA) 100 MG CAPS Take 100 mg by mouth every evening. CoQ10, Qunol      No facility-administered medications prior to visit.    PAST MEDICAL HISTORY:  Past Medical History:  Diagnosis Date  . Anemia    have received iron infusions  . Anxiety   . Arthritis     osteoarthritis  . Breast cancer in female (HCC)    Bilateral  . CAD S/P percutaneous coronary angioplasty 2007; March 2011   [employer] 3.0 mm 20 mm postdilated 3.6 mm in early mid LAD; status post ISR Cutting Balloon PTCA and March 11 along with PCI of distal mid lesion with a 3.0 mm 12 mm MultiLink vision BMS; the proximal stent causes jailing of SP1 and SP2 with ostial 70-80% lesions  . Cancer (HCC)    Melanoma, Squamous cell Carcinoma  . Chronic diastolic CHF (congestive heart failure), NYHA class 2 (HCC) 02/28/2018  . Chronic kidney disease    stage 2   . Dementia (HCC)    mild  . Diabetes mellitus type 2 with neurological manifestations (HCC)   . Dyslipidemia, goal LDL below 70   . Full dentures   . Genetic testing 12/03/2016   Germline genetic testing was performed through [facility] Common Hereditary Cancers Panel + [facility] Melanoma Panel. This  custom panel includes analysis of the following 51 genes: APC, ATM, AXIN2, BAP1, BARD1, BMPR1A, BRCA1, BRCA2, BRIP1, CDH1, CDK4, CDKN2A, CHEK2, CTNNA1, DICER1, EPCAM, GREM1, HOXB13, KIT, MEN1, MITF, MLH1, MSH2, MSH3, MSH6, MUTYH, NBN, NF1, NTHL1, PALB2, PDGFRA, PMS2, POLD1, POL  . GERD (gastroesophageal reflux disease)   . Headache(784.0)   . History of hematuria    Followed by Dr. Tannenbaum  . Hypertension, essential, benign   . Peripheral neuropathy   . Personal history of radiation therapy   . Stroke (HCC)   . TIA (transient ischemic attack)    multiple in the past  . Wears glasses     PAST SURGICAL HISTORY: Past Surgical History:  Procedure Laterality Date  . ABDOMINAL HYSTERECTOMY    . ANKLE FUSION Right 02/22/2018   Procedure: RIGHT SUBTALAR AND TALONAVICULAR FUSION;  Surgeon: Duda, Marcus V, MD;  Location: [facility] OR;  Service: Orthopedics;  Laterality: Right;  . APPENDECTOMY    . BLADDER SUSPENSION    . BREAST LUMPECTOMY Bilateral 2018  . BREAST LUMPECTOMY WITH RADIOACTIVE SEED AND SENTINEL LYMPH NODE BIOPSY Bilateral 12/14/2016   Procedure: BILATERAL BREAST LUMPECTOMY WITH BILATERAL  RADIOACTIVE SEED AND BILATERAL AXILLARY  SENTINEL LYMPH NODE BIOPSY ERAS PATHWAY;  Surgeon: Newman, David, MD;  Location: [facility];  Service: General;  Laterality: Bilateral;  PECTORAL BLOCK  . CARDIAC CATHETERIZATION  02/24/2006   85% stenosis in the proximal portion of LAD-arrangements made for PCI on 02/25/2006  . CARDIAC CATHETERIZATION  02/25/2006   80% LAD lesion stented with a 3x20mm Liberte stent resulting in reduction of 80% lesion to 0% residual  . CARDIAC CATHETERIZATION  04/26/2006   Medical management  . CARDIAC CATHETERIZATION  06/24/2009   60-70% re-stenosis in the proximal LAD. A 3.25x15 cutting balloon, 3 inflations - 14atm-38sec, 13atm-39sec, and 12atm-40sec reduced to less than 10%. 60% stenosis of the mid/distal LAD stented with a 3x12mm Multilink stent.  . CARDIAC CATHETERIZATION   07/09/2009   Medical management  . CAROTID DOPPLER  06/24/2009   40-59% R ICA stenosis. No significant ICA stenosis noted  . CHOLECYSTECTOMY    . DILATION AND CURETTAGE OF UTERUS    . JOINT REPLACEMENT Left   . LESION REMOVAL Right 03/11/2015   Procedure:  EXCISION OF RIGHT PRE TIBIAL LESION;  Surgeon: James Wyatt, MD;  Location: [facility];  Service: General;  Laterality: Right;  .   MASS EXCISION Left 06/10/2014   Procedure: EXCISION LEFT LOWER LEG LESION;  Surgeon: Jay Wyatt, MD;  Location: [facility];  Service: General;  Laterality: Left;  . MASS EXCISION Left 09/05/2015   Procedure: EXCISION OF LEFT FOREARM  MASS;  Surgeon: James Wyatt, MD;  Location: [facility];  Service: General;  Laterality: Left;  . NM MYOVIEW LTD  10/2011   No ischemia or infarction.  Normal EF.  LOW RISK. (Short bursts of 5 beats NSVT during recovery otherwise normal)  . SHOULDER ARTHROSCOPY  10/15  . SHOULDER ARTHROSCOPY  1995   left  . TEE WITHOUT CARDIOVERSION N/A 08/03/2017   Procedure: TRANSESOPHAGEAL ECHOCARDIOGRAM (TEE);  Surgeon: Marbleton, Tiffany, MD;  Location: [facility];  Service: Cardiovascular;; EF 60-65% with no wall motion normalities.  No atrial thrombus noted.  Lightly findings consistent with a lipomatous hypertrophy of the atrial septum.   . TENDON REPAIR Left 05/12/2016   Procedure: Left Anterior Tibial Tendon Reconstruction;  Surgeon: Marcus Duda V, MD;  Location: [facility];  Service: Orthopedics;  Laterality: Left;  . TOTAL KNEE ARTHROPLASTY  2005   left  . TRANSESOPHAGEAL ECHOCARDIOGRAM  08/03/2016   : EF 60-65% no wall motion normalities.  No atrial thrombus.  Lipomatous hypertrophy of the atrial septum.  No mass noted.  . TRANSESOPHAGEAL ECHOCARDIOGRAM  07/2017   EF 60 to 65%.  No R WMA.  No atrial thrombus noted.  Lipomatous IAS.  . TRANSTHORACIC ECHOCARDIOGRAM  02/25/2017   :EF 65-70%.  GR 1 DD.  No or WMA.  Moderate aortic calcification/sclerosis  but no stenosis.  . TRANSTHORACIC ECHOCARDIOGRAM  07/2017   Vigorous LV function.  EF 65-70%.  Mild diastolic dysfunction with no RWMA. GR 1 DD.  Mild aortic sclerosis/no stenosis.  Questionable right atrial mass discounted with TEE)   . WRIST ARTHROPLASTY  2010   cancer lt wrist    FAMILY HISTORY: Family History  Problem Relation Age of Onset  . Diabetes Mother   . Hypertension Mother   . Kidney disease Mother   . Hypertension Father   . Emphysema Father   . Heart attack Father   . Heart disease Father   . Arthritis Sister   . Diabetes Sister   . Hypertension Sister   . Breast cancer Sister 30       treated with mastectomy  . Cervical cancer Sister 20  . Hypertension Brother   . Hyperlipidemia Brother   . Diabetes Brother   . Stroke Brother   . Diabetes Sister   . Hypertension Sister   . Stomach cancer Sister 68  . Hypertension Brother   . ALS Brother        d.62s  . Breast cancer Daughter 40       triple negative treated with lumpectomy, chemo, radiation  . Heart attack Daughter 45       d.45  . COPD Daughter   . Cancer Paternal Aunt        unspecified type  . Cancer Paternal Uncle        unspecified type  . Cancer Paternal Uncle        unspecified type    SOCIAL HISTORY: Social History   Socioeconomic History  . Marital status: Married    Spouse name: Jerry  . Number of children: 3  . Years of education: 11  . Highest education level: Not on file  Occupational History  . Occupation: retired    Employer: RETIRED  Tobacco Use  .   Smoking status: Never Smoker  . Smokeless tobacco: Never Used  Vaping Use  . Vaping Use: Never used  Substance and Sexual Activity  . Alcohol use: No  . Drug use: No  . Sexual activity: Never  Other Topics Concern  . Not on file  Social History Narrative   She is a married mother of 3, grandmother of 2.  Usually very active and out about the house.  But currently over last 6-8 weeks has been incapacitated.  She never  smoked and does not drink alcohol.   10 th grade   Social Determinants of Health   Financial Resource Strain:   . Difficulty of Paying Living Expenses: Not on file  Food Insecurity: No Food Insecurity  . Worried About Running Out of Food in the Last Year: Never true  . Ran Out of Food in the Last Year: Never true  Transportation Needs: No Transportation Needs  . Lack of Transportation (Medical): No  . Lack of Transportation (Non-Medical): No  Physical Activity:   . Days of Exercise per Week: Not on file  . Minutes of Exercise per Session: Not on file  Stress:   . Feeling of Stress : Not on file  Social Connections:   . Frequency of Communication with Friends and Family: Not on file  . Frequency of Social Gatherings with Friends and Family: Not on file  . Attends Religious Services: Not on file  . Active Member of Clubs or Organizations: Not on file  . Attends Club or Organization Meetings: Not on file  . Marital Status: Not on file  Intimate Partner Violence:   . Fear of Current or Ex-Partner: Not on file  . Emotionally Abused: Not on file  . Physically Abused: Not on file  . Sexually Abused: Not on file     PHYSICAL EXAM  There were no vitals filed for this visit. There is no height or weight on file to calculate BMI.  Generalized: Pleasant obese elderly Caucasian female in no acute distress  Head: normocephalic and atraumatic Neck: Supple,  Musculoskeletal: No deformity Cardiovascular: 1+ BLE edema  Neurological examination   Mentation:  MMSE - Mini Mental State Exam 01/31/2019 04/06/2018 09/01/2017  Not completed: (No Data) - -  Orientation to time 4 4 3  Orientation to Place 4 5 5  Registration 3 3 3  Attention/ Calculation 1 1 4  Recall 3 2 3  Language- name 2 objects 2 2 2  Language- repeat 1 1 1  Language- follow 3 step command 3 3 3  Language- read & follow direction 1 1 1  Write a sentence 1 1 1  Copy design 1 1 1  Total score 24 24 27   Cranial nerve  II-XII: Pupils were equal round reactive to light extraocular movements were full, visual field were full on confrontational test. Facial sensation and strength were normal. hearing was intact to finger rubbing bilaterally. Uvula tongue midline. head turning and shoulder shrug were normal and symmetric.Tongue protrusion into cheek strength was normal. Motor: normal bulk and tone, full strength in the BUE, BLE  Sensory: Decreased pinprick to mid shin decreased vibratory to toes bilaterally Coordination: finger-nose-finger,  no dysmetria Reflexes: 1+ upper lower and symmetric plantar responses were flexor bilaterally. Gait and Station: Ambulates with broad-based gait and use a cane     DIAGNOSTIC DATA (LABS, IMAGING, TESTING) - I reviewed patient records, labs, notes, testing and imaging myself where available.  Lab Results  Component Value Date     WBC 9.9 12/05/2019   HGB 11.1 (L) 12/05/2019   HCT 34.5 (L) 12/05/2019   MCV 92.2 12/05/2019   PLT 294 12/05/2019      Component Value Date/Time   NA 138 12/05/2019 1003   NA 140 08/01/2017 1143   K 4.7 12/05/2019 1003   CL 100 12/05/2019 1003   CO2 30 12/05/2019 1003   GLUCOSE 125 (H) 12/05/2019 1003   BUN 13 12/05/2019 1003   BUN 25 08/01/2017 1143   CREATININE 1.61 (H) 12/05/2019 1003   CREATININE 0.94 01/09/2013 0839   CALCIUM 10.0 12/05/2019 1003   PROT 7.2 12/05/2019 1003   PROT 7.1 08/01/2017 1143   ALBUMIN 3.6 12/05/2019 1003   ALBUMIN 4.7 08/01/2017 1143   AST 39 12/05/2019 1003   ALT 26 12/05/2019 1003   ALKPHOS 110 12/05/2019 1003   BILITOT 0.6 12/05/2019 1003   GFRNONAA 30 (L) 12/05/2019 1003   GFRAA 34 (L) 12/05/2019 1003   Lab Results  Component Value Date   CHOL  12/31/2009    148        ATP III CLASSIFICATION:  <200     mg/dL   Desirable  200-239  mg/dL   Borderline High  >=240    mg/dL   High          HDL 74 12/31/2009   LDLCALC  12/31/2009    47        Total Cholesterol/HDL:CHD Risk Coronary Heart  Disease Risk Table                     Men   Women  1/2 Average Risk   3.4   3.3  Average Risk       5.0   4.4  2 X Average Risk   9.6   7.1  3 X Average Risk  23.4   11.0        Use the calculated Patient Ratio above and the CHD Risk Table to determine the patient's CHD Risk.        ATP III CLASSIFICATION (LDL):  <100     mg/dL   Optimal  100-129  mg/dL   Near or Above                    Optimal  130-159  mg/dL   Borderline  160-189  mg/dL   High  >190     mg/dL   Very High   TRIG 134 12/31/2009   CHOLHDL 2.0 12/31/2009   Lab Results  Component Value Date   HGBA1C 7.1 (H) 02/24/2018   Lab Results  Component Value Date   VITAMINB12 617 09/26/2009   Lab Results  Component Value Date   TSH 2.908 02/25/2018      ASSESSMENT AND PLAN 79y.o. 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 Continues on Cerefolin.  Likely restless leg syndrome which has been stable on Mirapex, Peripheral neuropathy (HCC), Chronic kidney disease; Headache(784.0); gait abnormality . Postherpetic neuralgia following shingles in February 2018.     Mild cognitive impairment -Stable with MMSE today ***/30 (prior 24/30 01/2019) -Continue Cerefolin  Peripheral neuropathy postherpetic neuralgia, post shingles 04/2016 -Continue transdermal compound cream for intermittent flareup of postherpetic pain -Continue Gabapentin 300 mg 3 times daily -Previously failed pregabalin and topiramate  RLS -Continue Mirapex    Follow-up in 1 year or call earlier if needed  I spent *** minutes of   face-to-face and non-face-to-face time with patient.  This included previsit chart review, lab review, study review, order entry, electronic health record documentation, patient education regarding mild cognitive impairment, peripheral neuropathy and postherpetic neuralgia, restless leg syndrome, ongoing use of  prescribed medications for above conditions and answered all her questions to patient satisfaction   Frann Rider, AGNP-BC  [facility] [address] [place], [place] [address]  Phone [phone] Fax [phone] Note: This document was prepared with digital dictation and possible smart phrase technology. Any transcriptional errors that result from this process are unintentional.

## 2020-02-05 DIAGNOSIS — E1142 Type 2 diabetes mellitus with diabetic polyneuropathy: Secondary | ICD-10-CM | POA: Diagnosis not present

## 2020-02-05 DIAGNOSIS — C50412 Malignant neoplasm of upper-outer quadrant of left female breast: Secondary | ICD-10-CM | POA: Diagnosis not present

## 2020-02-05 DIAGNOSIS — C50211 Malignant neoplasm of upper-inner quadrant of right female breast: Secondary | ICD-10-CM | POA: Diagnosis not present

## 2020-02-05 DIAGNOSIS — M19071 Primary osteoarthritis, right ankle and foot: Secondary | ICD-10-CM | POA: Diagnosis not present

## 2020-02-05 DIAGNOSIS — I2510 Atherosclerotic heart disease of native coronary artery without angina pectoris: Secondary | ICD-10-CM | POA: Diagnosis not present

## 2020-02-05 DIAGNOSIS — D630 Anemia in neoplastic disease: Secondary | ICD-10-CM | POA: Diagnosis not present

## 2020-02-08 ENCOUNTER — Emergency Department (HOSPITAL_COMMUNITY): Payer: Medicare Other

## 2020-02-08 ENCOUNTER — Other Ambulatory Visit: Payer: Self-pay

## 2020-02-08 ENCOUNTER — Inpatient Hospital Stay (HOSPITAL_COMMUNITY)
Admission: EM | Admit: 2020-02-08 | Discharge: 2020-02-15 | DRG: 956 | Disposition: A | Discharge status: Rehabilitation Facility | Payer: Medicare Other | Attending: Internal Medicine | Admitting: Internal Medicine

## 2020-02-08 ENCOUNTER — Encounter (HOSPITAL_COMMUNITY): Payer: Self-pay | Admitting: Internal Medicine

## 2020-02-08 ENCOUNTER — Inpatient Hospital Stay (HOSPITAL_COMMUNITY): Payer: Medicare Other

## 2020-02-08 DIAGNOSIS — Z7902 Long term (current) use of antithrombotics/antiplatelets: Secondary | ICD-10-CM | POA: Diagnosis not present

## 2020-02-08 DIAGNOSIS — S065X9A Traumatic subdural hemorrhage with loss of consciousness of unspecified duration, initial encounter: Secondary | ICD-10-CM | POA: Diagnosis not present

## 2020-02-08 DIAGNOSIS — R52 Pain, unspecified: Secondary | ICD-10-CM | POA: Diagnosis not present

## 2020-02-08 DIAGNOSIS — E1140 Type 2 diabetes mellitus with diabetic neuropathy, unspecified: Secondary | ICD-10-CM | POA: Diagnosis present

## 2020-02-08 DIAGNOSIS — E11319 Type 2 diabetes mellitus with unspecified diabetic retinopathy without macular edema: Secondary | ICD-10-CM | POA: Diagnosis not present

## 2020-02-08 DIAGNOSIS — S72002A Fracture of unspecified part of neck of left femur, initial encounter for closed fracture: Secondary | ICD-10-CM | POA: Diagnosis present

## 2020-02-08 DIAGNOSIS — W182XXA Fall in (into) shower or empty bathtub, initial encounter: Secondary | ICD-10-CM | POA: Diagnosis present

## 2020-02-08 DIAGNOSIS — R339 Retention of urine, unspecified: Secondary | ICD-10-CM | POA: Diagnosis not present

## 2020-02-08 DIAGNOSIS — S72009A Fracture of unspecified part of neck of unspecified femur, initial encounter for closed fracture: Secondary | ICD-10-CM

## 2020-02-08 DIAGNOSIS — Z96642 Presence of left artificial hip joint: Secondary | ICD-10-CM

## 2020-02-08 DIAGNOSIS — I5032 Chronic diastolic (congestive) heart failure: Secondary | ICD-10-CM | POA: Diagnosis not present

## 2020-02-08 DIAGNOSIS — D631 Anemia in chronic kidney disease: Secondary | ICD-10-CM | POA: Diagnosis not present

## 2020-02-08 DIAGNOSIS — N184 Chronic kidney disease, stage 4 (severe): Secondary | ICD-10-CM | POA: Diagnosis present

## 2020-02-08 DIAGNOSIS — Z85828 Personal history of other malignant neoplasm of skin: Secondary | ICD-10-CM | POA: Diagnosis not present

## 2020-02-08 DIAGNOSIS — E118 Type 2 diabetes mellitus with unspecified complications: Secondary | ICD-10-CM | POA: Diagnosis not present

## 2020-02-08 DIAGNOSIS — M160 Bilateral primary osteoarthritis of hip: Secondary | ICD-10-CM | POA: Diagnosis not present

## 2020-02-08 DIAGNOSIS — R269 Unspecified abnormalities of gait and mobility: Secondary | ICD-10-CM | POA: Diagnosis not present

## 2020-02-08 DIAGNOSIS — J9601 Acute respiratory failure with hypoxia: Secondary | ICD-10-CM | POA: Diagnosis not present

## 2020-02-08 DIAGNOSIS — S299XXA Unspecified injury of thorax, initial encounter: Secondary | ICD-10-CM | POA: Diagnosis not present

## 2020-02-08 DIAGNOSIS — I6522 Occlusion and stenosis of left carotid artery: Secondary | ICD-10-CM | POA: Diagnosis not present

## 2020-02-08 DIAGNOSIS — F0390 Unspecified dementia without behavioral disturbance: Secondary | ICD-10-CM | POA: Diagnosis not present

## 2020-02-08 DIAGNOSIS — K219 Gastro-esophageal reflux disease without esophagitis: Secondary | ICD-10-CM | POA: Diagnosis present

## 2020-02-08 DIAGNOSIS — R0600 Dyspnea, unspecified: Secondary | ICD-10-CM

## 2020-02-08 DIAGNOSIS — E1142 Type 2 diabetes mellitus with diabetic polyneuropathy: Secondary | ICD-10-CM | POA: Diagnosis not present

## 2020-02-08 DIAGNOSIS — S3993XA Unspecified injury of pelvis, initial encounter: Secondary | ICD-10-CM | POA: Diagnosis not present

## 2020-02-08 DIAGNOSIS — N189 Chronic kidney disease, unspecified: Secondary | ICD-10-CM | POA: Diagnosis not present

## 2020-02-08 DIAGNOSIS — Z79899 Other long term (current) drug therapy: Secondary | ICD-10-CM

## 2020-02-08 DIAGNOSIS — S72002D Fracture of unspecified part of neck of left femur, subsequent encounter for closed fracture with routine healing: Secondary | ICD-10-CM | POA: Diagnosis not present

## 2020-02-08 DIAGNOSIS — G4700 Insomnia, unspecified: Secondary | ICD-10-CM | POA: Diagnosis not present

## 2020-02-08 DIAGNOSIS — M25552 Pain in left hip: Secondary | ICD-10-CM | POA: Diagnosis not present

## 2020-02-08 DIAGNOSIS — E11610 Type 2 diabetes mellitus with diabetic neuropathic arthropathy: Secondary | ICD-10-CM | POA: Diagnosis present

## 2020-02-08 DIAGNOSIS — R578 Other shock: Secondary | ICD-10-CM | POA: Diagnosis not present

## 2020-02-08 DIAGNOSIS — N289 Disorder of kidney and ureter, unspecified: Secondary | ICD-10-CM | POA: Diagnosis not present

## 2020-02-08 DIAGNOSIS — E785 Hyperlipidemia, unspecified: Secondary | ICD-10-CM | POA: Diagnosis present

## 2020-02-08 DIAGNOSIS — E1122 Type 2 diabetes mellitus with diabetic chronic kidney disease: Secondary | ICD-10-CM | POA: Diagnosis not present

## 2020-02-08 DIAGNOSIS — M109 Gout, unspecified: Secondary | ICD-10-CM | POA: Diagnosis not present

## 2020-02-08 DIAGNOSIS — I2510 Atherosclerotic heart disease of native coronary artery without angina pectoris: Secondary | ICD-10-CM | POA: Diagnosis present

## 2020-02-08 DIAGNOSIS — M19071 Primary osteoarthritis, right ankle and foot: Secondary | ICD-10-CM | POA: Diagnosis not present

## 2020-02-08 DIAGNOSIS — E1159 Type 2 diabetes mellitus with other circulatory complications: Secondary | ICD-10-CM | POA: Diagnosis not present

## 2020-02-08 DIAGNOSIS — I959 Hypotension, unspecified: Secondary | ICD-10-CM | POA: Diagnosis not present

## 2020-02-08 DIAGNOSIS — S72012A Unspecified intracapsular fracture of left femur, initial encounter for closed fracture: Principal | ICD-10-CM | POA: Diagnosis present

## 2020-02-08 DIAGNOSIS — E1165 Type 2 diabetes mellitus with hyperglycemia: Secondary | ICD-10-CM | POA: Diagnosis not present

## 2020-02-08 DIAGNOSIS — Z9181 History of falling: Secondary | ICD-10-CM | POA: Diagnosis not present

## 2020-02-08 DIAGNOSIS — Z853 Personal history of malignant neoplasm of breast: Secondary | ICD-10-CM | POA: Diagnosis not present

## 2020-02-08 DIAGNOSIS — S199XXA Unspecified injury of neck, initial encounter: Secondary | ICD-10-CM | POA: Diagnosis not present

## 2020-02-08 DIAGNOSIS — M4802 Spinal stenosis, cervical region: Secondary | ICD-10-CM | POA: Diagnosis not present

## 2020-02-08 DIAGNOSIS — D62 Acute posthemorrhagic anemia: Secondary | ICD-10-CM | POA: Diagnosis not present

## 2020-02-08 DIAGNOSIS — I130 Hypertensive heart and chronic kidney disease with heart failure and stage 1 through stage 4 chronic kidney disease, or unspecified chronic kidney disease: Secondary | ICD-10-CM | POA: Diagnosis present

## 2020-02-08 DIAGNOSIS — Z981 Arthrodesis status: Secondary | ICD-10-CM | POA: Diagnosis not present

## 2020-02-08 DIAGNOSIS — F32A Depression, unspecified: Secondary | ICD-10-CM | POA: Diagnosis not present

## 2020-02-08 DIAGNOSIS — Z955 Presence of coronary angioplasty implant and graft: Secondary | ICD-10-CM | POA: Diagnosis not present

## 2020-02-08 DIAGNOSIS — E119 Type 2 diabetes mellitus without complications: Secondary | ICD-10-CM | POA: Diagnosis present

## 2020-02-08 DIAGNOSIS — S0990XA Unspecified injury of head, initial encounter: Secondary | ICD-10-CM | POA: Diagnosis not present

## 2020-02-08 DIAGNOSIS — Z8673 Personal history of transient ischemic attack (TIA), and cerebral infarction without residual deficits: Secondary | ICD-10-CM

## 2020-02-08 DIAGNOSIS — I6389 Other cerebral infarction: Secondary | ICD-10-CM | POA: Diagnosis not present

## 2020-02-08 DIAGNOSIS — Z7984 Long term (current) use of oral hypoglycemic drugs: Secondary | ICD-10-CM

## 2020-02-08 DIAGNOSIS — Z471 Aftercare following joint replacement surgery: Secondary | ICD-10-CM | POA: Diagnosis not present

## 2020-02-08 DIAGNOSIS — K5900 Constipation, unspecified: Secondary | ICD-10-CM | POA: Diagnosis not present

## 2020-02-08 DIAGNOSIS — S065X0A Traumatic subdural hemorrhage without loss of consciousness, initial encounter: Secondary | ICD-10-CM | POA: Diagnosis not present

## 2020-02-08 DIAGNOSIS — I639 Cerebral infarction, unspecified: Secondary | ICD-10-CM | POA: Diagnosis not present

## 2020-02-08 DIAGNOSIS — R609 Edema, unspecified: Secondary | ICD-10-CM | POA: Diagnosis not present

## 2020-02-08 DIAGNOSIS — Z20822 Contact with and (suspected) exposure to covid-19: Secondary | ICD-10-CM | POA: Diagnosis not present

## 2020-02-08 DIAGNOSIS — Z90710 Acquired absence of both cervix and uterus: Secondary | ICD-10-CM | POA: Diagnosis not present

## 2020-02-08 DIAGNOSIS — N179 Acute kidney failure, unspecified: Secondary | ICD-10-CM | POA: Diagnosis not present

## 2020-02-08 DIAGNOSIS — R0902 Hypoxemia: Secondary | ICD-10-CM | POA: Diagnosis not present

## 2020-02-08 DIAGNOSIS — D72829 Elevated white blood cell count, unspecified: Secondary | ICD-10-CM | POA: Diagnosis not present

## 2020-02-08 DIAGNOSIS — R9431 Abnormal electrocardiogram [ECG] [EKG]: Secondary | ICD-10-CM | POA: Diagnosis not present

## 2020-02-08 DIAGNOSIS — M8580 Other specified disorders of bone density and structure, unspecified site: Secondary | ICD-10-CM | POA: Diagnosis not present

## 2020-02-08 DIAGNOSIS — J986 Disorders of diaphragm: Secondary | ICD-10-CM | POA: Diagnosis not present

## 2020-02-08 DIAGNOSIS — G4489 Other headache syndrome: Secondary | ICD-10-CM | POA: Diagnosis not present

## 2020-02-08 DIAGNOSIS — W010XXA Fall on same level from slipping, tripping and stumbling without subsequent striking against object, initial encounter: Secondary | ICD-10-CM

## 2020-02-08 DIAGNOSIS — S0001XA Abrasion of scalp, initial encounter: Secondary | ICD-10-CM | POA: Diagnosis present

## 2020-02-08 DIAGNOSIS — C50412 Malignant neoplasm of upper-outer quadrant of left female breast: Secondary | ICD-10-CM | POA: Diagnosis not present

## 2020-02-08 DIAGNOSIS — I951 Orthostatic hypotension: Secondary | ICD-10-CM | POA: Diagnosis not present

## 2020-02-08 DIAGNOSIS — Y92002 Bathroom of unspecified non-institutional (private) residence single-family (private) house as the place of occurrence of the external cause: Secondary | ICD-10-CM

## 2020-02-08 DIAGNOSIS — C50211 Malignant neoplasm of upper-inner quadrant of right female breast: Secondary | ICD-10-CM | POA: Diagnosis not present

## 2020-02-08 DIAGNOSIS — F419 Anxiety disorder, unspecified: Secondary | ICD-10-CM | POA: Diagnosis not present

## 2020-02-08 DIAGNOSIS — I6200 Nontraumatic subdural hemorrhage, unspecified: Secondary | ICD-10-CM | POA: Diagnosis not present

## 2020-02-08 DIAGNOSIS — Z9049 Acquired absence of other specified parts of digestive tract: Secondary | ICD-10-CM | POA: Diagnosis not present

## 2020-02-08 DIAGNOSIS — I6523 Occlusion and stenosis of bilateral carotid arteries: Secondary | ICD-10-CM | POA: Diagnosis not present

## 2020-02-08 DIAGNOSIS — D630 Anemia in neoplastic disease: Secondary | ICD-10-CM | POA: Diagnosis not present

## 2020-02-08 DIAGNOSIS — W182XXD Fall in (into) shower or empty bathtub, subsequent encounter: Secondary | ICD-10-CM | POA: Diagnosis not present

## 2020-02-08 HISTORY — DX: Gout, unspecified: M10.9

## 2020-02-08 HISTORY — DX: Pain in right foot: M79.672

## 2020-02-08 HISTORY — DX: Fracture of unspecified part of neck of left femur, initial encounter for closed fracture: S72.002A

## 2020-02-08 HISTORY — DX: Fracture of unspecified part of neck of unspecified femur, initial encounter for closed fracture: S72.009A

## 2020-02-08 HISTORY — DX: Mild cognitive impairment of uncertain or unknown etiology: G31.84

## 2020-02-08 HISTORY — DX: Other postherpetic nervous system involvement: B02.29

## 2020-02-08 HISTORY — DX: Migraine, unspecified, not intractable, without status migrainosus: G43.909

## 2020-02-08 HISTORY — DX: Atherosclerotic heart disease of native coronary artery without angina pectoris: I25.10

## 2020-02-08 HISTORY — DX: Restless legs syndrome: G25.81

## 2020-02-08 HISTORY — DX: Pain in right foot: M79.671

## 2020-02-08 HISTORY — DX: Charcot's joint, right ankle and foot: M14.671

## 2020-02-08 HISTORY — DX: Charcot's joint, left ankle and foot: M14.672

## 2020-02-08 LAB — COMPREHENSIVE METABOLIC PANEL
ALT: 29 U/L (ref 0–44)
AST: 44 U/L — ABNORMAL HIGH (ref 15–41)
Albumin: 3.8 g/dL (ref 3.5–5.0)
Alkaline Phosphatase: 104 U/L (ref 38–126)
Anion gap: 10 (ref 5–15)
BUN: 29 mg/dL — ABNORMAL HIGH (ref 8–23)
CO2: 29 mmol/L (ref 22–32)
Calcium: 10 mg/dL (ref 8.9–10.3)
Chloride: 99 mmol/L (ref 98–111)
Creatinine, Ser: 1.77 mg/dL — ABNORMAL HIGH (ref 0.44–1.00)
GFR, Estimated: 29 mL/min — ABNORMAL LOW (ref >60)
Glucose, Bld: 179 mg/dL — ABNORMAL HIGH (ref 70–99)
Potassium: 4.9 mmol/L (ref 3.5–5.1)
Sodium: 138 mmol/L (ref 135–145)
Total Bilirubin: 0.5 mg/dL (ref 0.3–1.2)
Total Protein: 7.2 g/dL (ref 6.5–8.1)

## 2020-02-08 LAB — LIPID PANEL
Cholesterol: 148 mg/dL (ref 0–200)
HDL: 36 mg/dL — ABNORMAL LOW (ref >40)
LDL Cholesterol: 73 mg/dL (ref 0–99)
Total CHOL/HDL Ratio: 4.1 RATIO
Triglycerides: 193 mg/dL — ABNORMAL HIGH (ref <150)
VLDL: 39 mg/dL (ref 0–40)

## 2020-02-08 LAB — PROTIME-INR
INR: 1.1 (ref 0.8–1.2)
Prothrombin Time: 13.3 seconds (ref 11.4–15.2)

## 2020-02-08 LAB — SURGICAL PCR SCREEN
MRSA, PCR: NEGATIVE
Staphylococcus aureus: NEGATIVE

## 2020-02-08 LAB — CBC WITH DIFFERENTIAL/PLATELET
Abs Immature Granulocytes: 0.05 10*3/uL (ref 0.00–0.07)
Basophils Absolute: 0 10*3/uL (ref 0.0–0.1)
Basophils Relative: 1 %
Eosinophils Absolute: 0.1 10*3/uL (ref 0.0–0.5)
Eosinophils Relative: 2 %
HCT: 43.4 % (ref 36.0–46.0)
Hemoglobin: 13.3 g/dL (ref 12.0–15.0)
Immature Granulocytes: 1 %
Lymphocytes Relative: 13 %
Lymphs Abs: 1.1 10*3/uL (ref 0.7–4.0)
MCH: 28.9 pg (ref 26.0–34.0)
MCHC: 30.6 g/dL (ref 30.0–36.0)
MCV: 94.1 fL (ref 80.0–100.0)
Monocytes Absolute: 0.5 10*3/uL (ref 0.1–1.0)
Monocytes Relative: 6 %
Neutro Abs: 6.4 10*3/uL (ref 1.7–7.7)
Neutrophils Relative %: 77 %
Platelets: 217 10*3/uL (ref 150–400)
RBC: 4.61 MIL/uL (ref 3.87–5.11)
RDW: 14.6 % (ref 11.5–15.5)
WBC: 8.1 10*3/uL (ref 4.0–10.5)
nRBC: 0 % (ref 0.0–0.2)

## 2020-02-08 LAB — I-STAT CHEM 8, ED
BUN: 38 mg/dL — ABNORMAL HIGH (ref 8–23)
Calcium, Ion: 1.16 mmol/L (ref 1.15–1.40)
Chloride: 99 mmol/L (ref 98–111)
Creatinine, Ser: 1.7 mg/dL — ABNORMAL HIGH (ref 0.44–1.00)
Glucose, Bld: 173 mg/dL — ABNORMAL HIGH (ref 70–99)
HCT: 43 % (ref 36.0–46.0)
Hemoglobin: 14.6 g/dL (ref 12.0–15.0)
Potassium: 4.9 mmol/L (ref 3.5–5.1)
Sodium: 137 mmol/L (ref 135–145)
TCO2: 28 mmol/L (ref 22–32)

## 2020-02-08 LAB — ECHOCARDIOGRAM COMPLETE
Area-P 1/2: 2.69 cm2
Height: 66 in
S' Lateral: 2.7 cm
Weight: 3024 oz

## 2020-02-08 LAB — CBG MONITORING, ED
Glucose-Capillary: 122 mg/dL — ABNORMAL HIGH (ref 70–99)
Glucose-Capillary: 134 mg/dL — ABNORMAL HIGH (ref 70–99)

## 2020-02-08 LAB — RESPIRATORY PANEL BY RT PCR (FLU A&B, COVID)
Influenza A by PCR: NEGATIVE
Influenza B by PCR: NEGATIVE
SARS Coronavirus 2 by RT PCR: NEGATIVE

## 2020-02-08 LAB — GLUCOSE, CAPILLARY
Glucose-Capillary: 145 mg/dL — ABNORMAL HIGH (ref 70–99)
Glucose-Capillary: 141 mg/dL — ABNORMAL HIGH (ref 70–99)

## 2020-02-08 LAB — ABO/RH: ABO/RH(D): A POS

## 2020-02-08 LAB — HEMOGLOBIN A1C
Hgb A1c MFr Bld: 8.8 % — ABNORMAL HIGH (ref 4.8–5.6)
Mean Plasma Glucose: 205.86 mg/dL

## 2020-02-08 LAB — BRAIN NATRIURETIC PEPTIDE: B Natriuretic Peptide: 35.1 pg/mL (ref 0.0–100.0)

## 2020-02-08 MED ORDER — METHOCARBAMOL 1000 MG/10ML IJ SOLN
500.0000 mg | Freq: Once | INTRAVENOUS | Status: AC
  Administered 2020-02-08: 500 mg via INTRAVENOUS
  Filled 2020-02-08: qty 5

## 2020-02-08 MED ORDER — METOPROLOL SUCCINATE ER 25 MG PO TB24: 25.0000 mg | ORAL_TABLET | Freq: Every day | ORAL | Status: DC

## 2020-02-08 MED ORDER — INSULIN ASPART 100 UNIT/ML ~~LOC~~ SOLN
0.0000 [IU] | SUBCUTANEOUS | Status: DC
  Administered 2020-02-08 – 2020-02-10 (×7): 1 [IU] via SUBCUTANEOUS
  Administered 2020-02-11: 3 [IU] via SUBCUTANEOUS
  Administered 2020-02-11: 2 [IU] via SUBCUTANEOUS
  Administered 2020-02-11: 3 [IU] via SUBCUTANEOUS

## 2020-02-08 MED ORDER — HYDROCODONE-ACETAMINOPHEN 5-325 MG PO TABS
1.0000 | ORAL_TABLET | Freq: Four times a day (QID) | ORAL | Status: DC | PRN
  Administered 2020-02-10: 1 via ORAL
  Administered 2020-02-11 – 2020-02-12 (×3): 2 via ORAL
  Administered 2020-02-13 – 2020-02-14 (×2): 1 via ORAL
  Filled 2020-02-08: qty 1
  Filled 2020-02-08 (×2): qty 2
  Filled 2020-02-08: qty 1
  Filled 2020-02-08: qty 2
  Filled 2020-02-08: qty 1

## 2020-02-08 MED ORDER — MORPHINE SULFATE (PF) 2 MG/ML IV SOLN
2.0000 mg | Freq: Once | INTRAVENOUS | Status: AC
  Administered 2020-02-08: 2 mg via INTRAVENOUS
  Filled 2020-02-08: qty 1

## 2020-02-08 MED ORDER — ONDANSETRON HCL 4 MG/2ML IJ SOLN
4.0000 mg | Freq: Four times a day (QID) | INTRAMUSCULAR | Status: DC | PRN
  Administered 2020-02-15: 4 mg via INTRAVENOUS
  Filled 2020-02-08: qty 2

## 2020-02-08 MED ORDER — MORPHINE SULFATE (PF) 2 MG/ML IV SOLN
1.0000 mg | INTRAVENOUS | Status: DC | PRN
  Administered 2020-02-08 – 2020-02-10 (×8): 1 mg via INTRAVENOUS
  Filled 2020-02-08 (×8): qty 1

## 2020-02-08 MED ORDER — METHOCARBAMOL 500 MG PO TABS
500.0000 mg | ORAL_TABLET | Freq: Four times a day (QID) | ORAL | Status: DC | PRN
  Administered 2020-02-08 – 2020-02-12 (×7): 500 mg via ORAL
  Filled 2020-02-08 (×8): qty 1

## 2020-02-08 MED ORDER — ONDANSETRON HCL 4 MG/2ML IJ SOLN
4.0000 mg | Freq: Once | INTRAMUSCULAR | Status: AC
  Administered 2020-02-08: 4 mg via INTRAVENOUS
  Filled 2020-02-08: qty 2

## 2020-02-08 MED ORDER — INSULIN ASPART 100 UNIT/ML ~~LOC~~ SOLN: 0.0000 [IU] | SUBCUTANEOUS | Status: DC

## 2020-02-08 MED ORDER — METOPROLOL SUCCINATE ER 25 MG PO TB24
12.5000 mg | ORAL_TABLET | Freq: Every day | ORAL | Status: DC
  Administered 2020-02-08 – 2020-02-15 (×7): 12.5 mg via ORAL
  Filled 2020-02-08 (×9): qty 1

## 2020-02-08 MED ORDER — LACTATED RINGERS IV BOLUS
1000.0000 mL | Freq: Once | INTRAVENOUS | Status: AC
  Administered 2020-02-08: 1000 mL via INTRAVENOUS

## 2020-02-08 NOTE — Plan of Care (Signed)

## 2020-02-08 NOTE — Progress Notes (Signed)
   02/08/20 0155  Clinical Encounter Type  Visit Type ED  Referral From Other (Comment) (Level 2 Page)  Consult/Referral To Chaplain  Chaplain responded to Level 2.  79 yr. Female fall and on blood thinners head injury.   No family present currently and Chaplain was not needed.   Chaplain Lillyn Wieczorek [place] [phone]

## 2020-02-08 NOTE — Plan of Care (Signed)

## 2020-02-08 NOTE — ED Notes (Signed)
Pt returned to room from CT. Dr. Roxanne Mins at bedside.

## 2020-02-08 NOTE — ED Notes (Signed)
MRI here to get patient.

## 2020-02-08 NOTE — ED Notes (Signed)
Patient transported to CT 

## 2020-02-08 NOTE — Consult Note (Signed)
NEUROLOGY CONSULTATION NOTE   Date of service: February 08, 2020 Patient Name: [name] MRN:  [id number] DOB:  09/13/1940 Reason for consult: "stroke on [facility]"  History of Present Illness  [name] is a 79 y.o. female with PMH significant for CAD, DM2 with neuropathy who presents after a fall at home.  Patient was in the bathroom when she reports that she turned around after using the sink and the bathrug was not in the right place and it slipped from underneath her and she fell. She hit her head on the shower and fell on the floor. Xray demonstrated a left femoral neck fracture.  She had CTH without contrast which demonstrated a subacute to remote cortical infarct involving the precentral gyrus of the right frontal lobe.  We were consulted to assist with evaluation of the stroke.  Patient denies any prior hx of stroke. She did not feel that her leg gave out this time. She denies any arm or leg weakness or numbness. She does endorse that for the last 5-6 months, she has noted some drooling from the left mouth.   ROS   Constitutional Denies weight loss, fever and chills.  HEENT Denies changes in vision and hearing.  Respiratory Denies SOB and cough.  CV Denies palpitations and CP  GI Denies abdominal pain, nausea, vomiting and diarrhea.   GU Denies dysuria and urinary frequency.   MSK Denies myalgia and joint pain.   Skin Denies rash and pruritus.   Neurological Denies headache and syncope.   Psychiatric Denies recent changes in mood. Denies anxiety and depression.    Past History   Past Medical History:  Diagnosis Date  . Coronary artery disease   . Diabetes mellitus without complication [facility])    Past Surgical History:  Procedure Laterality Date  . CARDIAC CATHETERIZATION    . CORONARY ANGIOPLASTY     Family History  Family history unknown: Yes   Social History   Socioeconomic History  . Marital status: Married    Spouse name: Not on file  . Number of  children: Not on file  . Years of education: Not on file  . Highest education level: Not on file  Occupational History  . Not on file  Tobacco Use  . Smoking status: Never Smoker  . Smokeless tobacco: Never Used  Substance and Sexual Activity  . Alcohol use: Never  . Drug use: Never  . Sexual activity: Not on file  Other Topics Concern  . Not on file  Social History Narrative  . Not on file   Social Determinants of Health   Financial Resource Strain:   . Difficulty of Paying Living Expenses: Not on file  Food Insecurity:   . Worried About Charity fundraiser in the Last Year: Not on file  . Ran Out of Food in the Last Year: Not on file  Transportation Needs:   . Lack of Transportation (Medical): Not on file  . Lack of Transportation (Non-Medical): Not on file  Physical Activity:   . Days of Exercise per Week: Not on file  . Minutes of Exercise per Session: Not on file  Stress:   . Feeling of Stress : Not on file  Social Connections:   . Frequency of Communication with Friends and Family: Not on file  . Frequency of Social Gatherings with Friends and Family: Not on file  . Attends Religious Services: Not on file  . Active Member of Clubs or Organizations: Not on file  .  Attends Archivist Meetings: Not on file  . Marital Status: Not on file   No Known Allergies  Medications  (Not in a hospital admission)    Vitals   Vitals:   02/08/20 0430 02/08/20 0445 02/08/20 0515 02/08/20 0545  BP: (!) 133/59 118/67 123/72 108/72  Pulse: 95 91 93 93  Resp: 18 20 20  (!) 21  SpO2: 92% 92% 92% 90%  Weight:      Height:         Body mass index is 30.51 kg/m.  Physical Exam   General: Laying comfortably in bed; in no acute distress. HENT: Normal oropharynx and mucosa. Normal external appearance of ears and nose. Neck: Supple, no pain or tenderness CV: No JVD. No peripheral edema. Pulmonary: Symmetric Chest rise. Normal respiratory effort. Abdomen: Soft to  touch, non-tender. Ext: No cyanosis, edema, left leg is shorter than right leg with hammer toes BL. Skin: No rash. Normal palpation of skin.  Musculoskeletal: Normal digits and nails by inspection. No clubbing.  Neurologic Examination  Mental status/Cognition: Alert, oriented to self, place, month and year, good attention. Speech/language: Fluent, comprehension intact, object naming intact, repetition intact. Cranial nerves:   CN II Pupils equal and reactive to light, no VF deficits   CN III,IV,VI EOM intact, no gaze preference or deviation, no nystagmus   CN V normal sensation in V1, V2, and V3 segments bilaterally   CN VII Mild drooping of the angle of mouth on the left.   CN VIII normal hearing to speech    CN IX & X normal palatal elevation, no uvular deviation   CN XI 5/5 head turn and 5/5 shoulder shrug bilaterally   CN XII midline tongue protrusion   Motor:  Muscle bulk: normal, tone normal, pronator drift none tremor none Mvmt Root Nerve  Muscle Right Left Comments  SA C5/6 Ax Deltoid 5 5   EF C5/6 Mc Biceps 5 5   EE C6/7/8 Rad Triceps 5 5   WF C6/7 Med FCR 5 5   WE C7/8 PIN ECU 5 5   F Ab C8/T1 U ADM/FDI 5 5   HF L1/2/3 Fem Illopsoas 4    KE L2/3/4 Fem Quad 5    DF L4/5 D Peron Tib Ant 5 2   PF S1/2 Tibial Grc/Sol 2 2    Did not assess strength in left leg due to known fracture.  Reflexes:  Right Left Comments  Pectoralis      Biceps (C5/6) 1 1   Brachioradialis (C5/6) 1 1    Triceps (C6/7) 1 1    Patellar (L3/4) 1     Achilles (S1) 0 0    Hoffman      Plantar     Jaw jerk    Sensation:  Light touch Decreased in stocking distribution in BL feet   Pin prick Decreased in stocking distribution in BL feet   Temperature    Vibration   Proprioception    Coordination/Complex Motor:  - Finger to Nose intact BL - Heel to shin intact on the right - Rapid alternating movement are normal - Gait: did not assess due to left femur fracture.  Labs   CBC:  Recent  Labs  Lab 02/08/20 0235 02/08/20 0241  WBC 8.1  --   NEUTROABS 6.4  --   HGB 13.3 14.6  HCT 43.4 43.0  MCV 94.1  --   PLT 217  --     Basic Metabolic Panel:  Lab  Results  Component Value Date   NA 137 02/08/2020   K 4.9 02/08/2020   CO2 29 02/08/2020   GLUCOSE 173 (H) 02/08/2020   BUN 38 (H) 02/08/2020   CREATININE 1.70 (H) 02/08/2020   CALCIUM 10.0 02/08/2020   GFRNONAA 29 (L) 02/08/2020   Lipid Panel: No results found for: LDLCALC HgbA1c: No results found for: HGBA1C Urine Drug Screen: No results found for: LABOPIA, COCAINSCRNUR, LABBENZ, AMPHETMU, THCU, LABBARB  Alcohol Level No results found for: [facility]  CT Head Wo Contrast IMPRESSION: No acute intracranial injury.  No calvarial fracture.  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    Impression   [name] is a 79 y.o. female with PMH significant for with PMH significant for CAD, DM2 with neuropathy who presents after a fall at home due to slipping and found to have left femure fracture. Her neurologic examination is notable for mild drooping of the angle of mouth on the left. Did not assess left leg strength 2/2 known femur fracture.  It is somewhat difficult to assess the age of the noted R frontal lobe stroke on the [facility]. Clinically, she reports drooling from her L mouth over the last 5 month and that may have been due to a stroke back then. We will get an MRI Brain and stroke workup.  Impression: R frontal ischemic stroke.  Recommendations  Plan:  - Neuro checks per stroke unit protocol - Agree with brain imaging with MRI Brain without contrast - Agree with Vascular imaging with MRA Angio Head without contrast and MRA neck without contrast - If the MRI shows that the stroke is acute, then I would recommend starting aspirin if orthopedic surgery is okay and completion of full stroke workup inpatient with LDL, HbA1c, TTE. If the  stroke is remote on MRI and not acute, patient can be started on aspirin 81mg  daily when okay with orthopedic surgery and the stroke workup can be done outpatient.  ______________________________________________________________________   Thank you for the opportunity to take part in the care of this patient. If you have any further questions, please contact the neurology consultation attending.  Signed,  [place] Pager Number [phone]

## 2020-02-08 NOTE — ED Notes (Signed)
Patient back from MRI.

## 2020-02-08 NOTE — Progress Notes (Signed)
  Echocardiogram 2D Echocardiogram has been performed.  Fidel Levy 02/08/2020, 2:15 PM

## 2020-02-08 NOTE — ED Notes (Signed)
Patient in MRI at this time. 

## 2020-02-08 NOTE — H&P (Signed)
History and Physical    [name] QPR:[id number] DOB: [birth year] DOA: 02/08/2020  PCP: [name], DO  Patient coming from: Home.  Chief Complaint: Fall.  HPI: [name] is a 79 y.o. female with history of CAD status post stenting, chronic kidney disease stage IV follows with [name], diabetes mellitus type 2, chronic diastolic CHF and chronic bilateral extremity edema had a fall at home when patient was trying to get out of the shower.  Did not hit her head but did not lose consciousness did not have any chest pain or shortness of breath.  Had pain around the left hip and was brought to the ER.  ED Course: X-rays revealed left hip fracture and on-call orthopedic surgeon Dr. Gilford Rile was consulted.  Since patient had a hit her head CT head and C-spine was done which shows possibility of a subacute infarct in the right frontal area.  Neurologist on-call was consulted requested MRI brain to make sure there was no acute stroke.  EKG shows normal sinus rhythm.  Labs are largely at baseline with creatinine 1.7 blood glucose 179 CBC unremarkable.  Review of Systems: As per HPI, rest all negative.   Past Medical History:  Diagnosis Date  . Coronary artery disease   . Diabetes mellitus without complication [facility])     Past Surgical History:  Procedure Laterality Date  . CARDIAC CATHETERIZATION    . CORONARY ANGIOPLASTY       reports that she has never smoked. She has never used smokeless tobacco. She reports that she does not drink alcohol and does not use drugs.  No Known Allergies  Family History  Family history unknown: Yes    Prior to Admission medications   Medication Sig Start Date End Date Taking? Authorizing Provider  BIOTIN PO Take 1 tablet by mouth daily.   Yes [provider]  Cholecalciferol (VITAMIN D) 50 MCG (2000 UT) tablet Take 2,000 Units by mouth daily.   Yes [provider]  clopidogrel (PLAVIX) 75 MG tablet Take 75 mg by mouth  daily. 01/10/20  Yes [provider]  Coenzyme Q10 (COQ-10 PO) Take 1 tablet by mouth daily.   Yes [provider]  colchicine 0.6 MG tablet Take 0.6 mg by mouth daily as needed (for up to 3 days for gout flare).   Yes [provider]  CRANBERRY-CALCIUM PO Take 1 tablet by mouth daily.   Yes [provider]  diazepam (VALIUM) 5 MG tablet Take 5 mg by mouth 3 (three) times daily as needed. 10/05/19  Yes [provider]  febuxostat (ULORIC) 40 MG tablet Take 40 mg by mouth daily. 12/31/19  Yes [provider]  gabapentin (NEURONTIN) 300 MG capsule Take 300 mg by mouth 2 (two) times daily.  12/18/19  Yes [provider]  JANUVIA 25 MG tablet Take 25 mg by mouth daily. 12/26/19  Yes [provider]  KRILL OIL PO Take 1 capsule by mouth daily.   Yes [provider]  Methylfol-Methylcob-Acetylcyst (CEREFOLIN NAC) 6-2-600 MG TABS Take 1 tablet by mouth in the morning.   Yes [provider]  metolazone (ZAROXOLYN) 2.5 MG tablet Take 2.5 mg by mouth daily. 30 minutes before Bumetanide Mon. & Thurs.   Yes [provider]  metoprolol succinate (TOPROL-XL) 25 MG 24 hr tablet Take 25 mg by mouth at bedtime.  12/27/19  Yes [provider]  mirtazapine (REMERON) 15 MG tablet Take 15 mg by mouth at bedtime.  01/29/20  Yes [provider]  nitroGLYCERIN (NITROLINGUAL) 0.4 MG/SPRAY spray Place 1 spray under the tongue every 5 (five) minutes x 3 doses as needed for chest pain.   Yes [provider]  pramipexole (MIRAPEX) 0.25 MG tablet Take 0.75 mg by mouth at bedtime.  12/26/19  Yes [provider]  Resveratrol 250 MG CAPS Take 500 mg by mouth daily.   Yes [provider]  rosuvastatin (CRESTOR) 20 MG tablet Take 20 mg by mouth daily.   Yes [provider]    Physical Exam: Constitutional: Moderately built and nourished. Vitals:   02/08/20 0430 02/08/20 0445 02/08/20  0515 02/08/20 0545  BP: (!) 133/59 118/67 123/72 108/72  Pulse: 95 91 93 93  Resp: 18 20 20  (!) 21  SpO2: 92% 92% 92% 90%  Weight:      Height:       Eyes: Anicteric no pallor. ENMT: No discharge from the ears eyes nose or mouth. Neck: No mass felt.  No neck rigidity. Respiratory: No rhonchi or crepitations. Cardiovascular: S1-S2 heard. Abdomen: Soft nontender bowel sounds present. Musculoskeletal: Bilateral lower extremity edema present.  Pain on moving left hip. Skin: Chronic skin changes. Neurologic: Alert awake oriented to time place and person.  Moves all extremities.  Left lower extremity is restricted because of pain.  Other extremities are 5 x 5.  No facial asymmetry tongue is midline.  Pupils equal reacting to light. Psychiatric: Appears normal.  Normal affect.   Labs on Admission: I have personally reviewed following labs and imaging studies  CBC: Recent Labs  Lab 02/08/20 0235 02/08/20 0241  WBC 8.1  --   NEUTROABS 6.4  --   HGB 13.3 14.6  HCT 43.4 43.0  MCV 94.1  --   PLT 217  --    Basic Metabolic Panel: Recent Labs  Lab 02/08/20 0235 02/08/20 0241  NA 138 137  K 4.9 4.9  CL 99 99  CO2 29  --   GLUCOSE 179* 173*  BUN 29* 38*  CREATININE 1.77* 1.70*  CALCIUM 10.0  --    GFR: Estimated Creatinine Clearance: 28.6 mL/min (A) (by C-G formula based on SCr of 1.7 mg/dL (H)). Liver Function Tests: Recent Labs  Lab 02/08/20 0235  AST 44*  ALT 29  ALKPHOS 104  BILITOT 0.5  PROT 7.2  ALBUMIN 3.8   No results for input(s): LIPASE, AMYLASE in the last 168 hours. No results for input(s): AMMONIA in the last 168 hours. Coagulation Profile: Recent Labs  Lab 02/08/20 0235  INR 1.1   Cardiac Enzymes: No results for input(s): CKTOTAL, CKMB, CKMBINDEX, TROPONINI in the last 168 hours. BNP (last 3 results) No results for input(s): PROBNP in the last 8760 hours. HbA1C: No results for input(s): HGBA1C in the last 72 hours. CBG: No results for  input(s): GLUCAP in the last 168 hours. Lipid Profile: No results for input(s): CHOL, HDL, LDLCALC, TRIG, CHOLHDL, LDLDIRECT in the last 72 hours. Thyroid Function Tests: No results for input(s): TSH, T4TOTAL, FREET4, T3FREE, THYROIDAB in the last 72 hours. Anemia Panel: No results for input(s): VITAMINB12, FOLATE, FERRITIN, TIBC, IRON, RETICCTPCT in the last 72 hours. Urine analysis: No results found for: COLORURINE, APPEARANCEUR, LABSPEC, PHURINE, GLUCOSEU, HGBUR, BILIRUBINUR, KETONESUR, PROTEINUR, UROBILINOGEN, NITRITE, LEUKOCYTESUR Sepsis Labs: @LABRCNTIP (procalcitonin:4,lacticidven:4) ) Recent Results (from the past 240 hour(s))  Respiratory Panel by RT PCR (Flu A&B, Covid) - Nasopharyngeal Swab     Status: None   Collection Time: 02/08/20  3:42 AM  Specimen: Nasopharyngeal Swab  Result Value Ref Range Status   SARS Coronavirus 2 by RT PCR NEGATIVE NEGATIVE Final    Comment: (NOTE) SARS-CoV-2 target nucleic acids are NOT DETECTED.  The SARS-CoV-2 RNA is generally detectable in upper respiratoy specimens during the acute phase of infection. The lowest concentration of SARS-CoV-2 viral copies this assay can detect is 131 copies/mL. A negative result does not preclude SARS-Cov-2 infection and should not be used as the sole basis for treatment or other patient management decisions. A negative result may occur with  improper specimen collection/handling, submission of specimen other than nasopharyngeal swab, presence of viral mutation(s) within the areas targeted by this assay, and inadequate number of viral copies (<131 copies/mL). A negative result must be combined with clinical observations, patient history, and epidemiological information. The expected result is Negative.  Fact Sheet for Patients:  PinkCheek.be  Fact Sheet for Healthcare Providers:  GravelBags.it  This test is no t yet approved or cleared by the  [place] FDA and  has been authorized for detection and/or diagnosis of SARS-CoV-2 by FDA under an Emergency Use Authorization (EUA). This EUA will remain  in effect (meaning this test can be used) for the duration of the COVID-19 declaration under Section 564(b)(1) of the Act, 21 U.S.C. section 360bbb-3(b)(1), unless the authorization is terminated or revoked sooner.     Influenza A by PCR NEGATIVE NEGATIVE Final   Influenza B by PCR NEGATIVE NEGATIVE Final    Comment: (NOTE) The Xpert Xpress SARS-CoV-2/FLU/RSV assay is intended as an aid in  the diagnosis of influenza from Nasopharyngeal swab specimens and  should not be used as a sole basis for treatment. Nasal washings and  aspirates are unacceptable for Xpert Xpress SARS-CoV-2/FLU/RSV  testing.  Fact Sheet for Patients: PinkCheek.be  Fact Sheet for Healthcare Providers: GravelBags.it  This test is not yet approved or cleared by the [place] FDA and  has been authorized for detection and/or diagnosis of SARS-CoV-2 by  FDA under an Emergency Use Authorization (EUA). This EUA will remain  in effect (meaning this test can be used) for the duration of the  Covid-19 declaration under Section 564(b)(1) of the Act, 21  U.S.C. section 360bbb-3(b)(1), unless the authorization is  terminated or revoked. Performed at [facility], [place] [address], [place], [place] [address]      Radiological Exams on Admission: CT Head Wo Contrast  Result Date: 02/08/2020 CLINICAL DATA:  Head trauma EXAM: CT HEAD WITHOUT CONTRAST CT CERVICAL SPINE WITHOUT CONTRAST TECHNIQUE: Multidetector CT imaging of the head and cervical spine was performed following the standard protocol without intravenous contrast. Multiplanar CT image reconstructions of the cervical spine were also generated. COMPARISON:  CT head 11/26/2019 FINDINGS: CT HEAD FINDINGS Brain: Normal anatomic configuration of  the brain. There is interval development of a small cortical infarct involving the right frontal lobe involving the precentral gyrus, best seen on axial image # 22/3. No significant associated mass effect. No midline shift. Mild periventricular white matter changes are again identified in keeping with probable small vessel ischemia. No abnormal intra or extra-axial mass lesion. No evidence of acute intracranial hemorrhage. Ventricular size is normal. Cerebellum is unremarkable. Vascular: No asymmetric hyperdense vasculature at the skull base. Skull: Intact Sinuses/Orbits: Visualized paranasal sinuses are clear. Orbits are unremarkable. Other: Mastoid air cells and middle ear cavities are clear. CT CERVICAL SPINE FINDINGS Alignment: 3 mm anterolisthesis C4 upon C5 and C7 upon T1 is likely degenerative in nature. Otherwise normal cervical  lordosis. Skull base and vertebrae: The craniocervical junction is unremarkable. The atlantodental interval is normal. No acute fracture of the cervical spine. No lytic or blastic bone lesion Soft tissues and spinal canal: No prevertebral fluid or swelling. No visible canal hematoma. Disc levels: Review of the sagittal images demonstrates preservation of vertebral body height. 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 Multiple erosions are seen at the base of the odontoid process likely related to synovial overgrowth. 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 The spinal canal appears widely patent. The prevertebral soft tissues are not thickened. Review of the axial images demonstrates multilevel uncovertebral and facet arthrosis resulting in multilevel neural foraminal narrowing, most severe on the left at C3-4 and on the right at C4-5 as well as mild central canal stenosis at C6-7. Upper chest: Unremarkable  Other: Advanced atherosclerotic calcification is seen within the left carotid bifurcation. The degree of stenosis is not well assessed on this noncontrast examination. IMPRESSION: No acute intracranial injury.  No calvarial fracture. 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 No acute fracture or listhesis of the cervical spine. Electronically Signed   By: Fidela Salisbury MD   On: 02/08/2020 03:25   CT Cervical Spine Wo Contrast  Result Date: 02/08/2020 CLINICAL DATA:  Head trauma EXAM: CT HEAD WITHOUT CONTRAST CT CERVICAL SPINE WITHOUT CONTRAST TECHNIQUE: Multidetector CT imaging of the head and cervical spine was performed following the standard protocol without intravenous contrast. Multiplanar CT image reconstructions of the cervical spine were also generated. COMPARISON:  CT head 11/26/2019 FINDINGS: CT HEAD FINDINGS Brain: Normal anatomic configuration of the brain. There is interval development of a small cortical infarct involving the right frontal lobe involving the precentral gyrus, best seen on axial image # 22/3. No significant associated mass effect. No midline shift. Mild periventricular white matter changes are again identified in keeping with probable small vessel ischemia. No abnormal intra or extra-axial mass lesion. No evidence of acute intracranial hemorrhage. Ventricular size is normal. Cerebellum is unremarkable. Vascular: No asymmetric hyperdense vasculature at the skull base. Skull: Intact Sinuses/Orbits: Visualized paranasal sinuses are clear. Orbits are unremarkable. Other: Mastoid air cells and middle ear cavities are clear. CT CERVICAL SPINE FINDINGS Alignment: 3 mm anterolisthesis C4 upon C5 and C7 upon T1 is likely degenerative in nature. Otherwise normal cervical lordosis. Skull base and vertebrae: The craniocervical junction is unremarkable. The atlantodental interval is normal. No acute fracture of the  cervical spine. No lytic or blastic bone lesion Soft tissues and spinal canal: No prevertebral fluid or swelling. No visible canal hematoma. Disc levels: Review of the sagittal images demonstrates preservation of vertebral body height. 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 Multiple erosions are seen at the base of the odontoid process likely related to synovial overgrowth. 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 The spinal canal appears widely patent. The prevertebral soft tissues are not thickened. Review of the axial images demonstrates multilevel uncovertebral and facet arthrosis resulting in multilevel neural foraminal narrowing, most severe on the left at C3-4 and on the right at C4-5 as well as mild central canal stenosis at C6-7. Upper chest: Unremarkable Other: Advanced atherosclerotic calcification is seen within the left carotid  bifurcation. The degree of stenosis is not well assessed on this noncontrast examination. IMPRESSION: No acute intracranial injury.  No calvarial fracture. 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 No acute fracture or listhesis of the cervical spine. Electronically Signed   By: Fidela Salisbury MD   On: 02/08/2020 03:25   DG Pelvis Portable  Result Date: 02/08/2020 CLINICAL DATA:  Pelvic trauma EXAM: PORTABLE PELVIS 1-2 VIEWS COMPARISON:  None. FINDINGS: Single view radiograph the pelvis demonstrates an acute, left subcapital femoral neck fracture with superior displacement, varus angulation, and external rotation of the distal fracture fragment. The femoral head is still seated within the left acetabulum. Mild bilateral hip joint space narrowing is present in keeping with mild bilateral degenerative arthritis. Pelvis and  sacrum appear intact. Limited evaluation of the right hip is unremarkable. IMPRESSION: Acute, displaced, angulated subcapital left femoral neck fracture. Electronically Signed   By: Fidela Salisbury MD   On: 02/08/2020 02:44   DG Chest Port 1 View  Result Date: 02/08/2020 CLINICAL DATA:  Chest trauma EXAM: PORTABLE CHEST 1 VIEW COMPARISON:  10/04/2018 FINDINGS: The heart size and mediastinal contours are within normal limits. Both lungs are clear. The visualized skeletal structures are unremarkable. IMPRESSION: No active disease. Electronically Signed   By: Fidela Salisbury MD   On: 02/08/2020 02:42    EKG: Independently reviewed.  Normal sinus rhythm.  Assessment/Plan Principal Problem:   Hip fracture, left, closed, initial encounter [facility]) Active Problems:   CAD (coronary artery disease)   CKD (chronic kidney disease) stage 4, GFR 15-29 ml/min (HCC)   Chronic diastolic CHF (congestive heart failure) (HCC)   Type 2 diabetes mellitus with vascular disease (HCC)   Hip fracture (HCC)   Closed fracture of neck of left femur ([place])    1. Left hip fracture status post mechanical fall for which orthopedic surgeon has been consulted patient has been placed on pain really medication.  At this time since there is concern for possible stroke have ordered MRI and based on this MRI studies of the acute stroke surgery probably be postponed for the next day. 2. Possible stroke for which I have discussed with on-call neurologist.  MRI brain and MRA brain and neck has been requested.  If studies show anything acute then will need further stroke work-up. 3. History of CAD status post ending on Plavix and Crestor and beta-blockers.  Presently n.p.o.  Denies any chest pain. 4. Chronic diastolic CHF and chronic kidney disease stage IV on Bumex presently n.p.o. Will restart once after surgery.  Closely follow respiratory status. 5. Chronic kidney disease stage IV creatinine appears to be at baseline. 6. Diabetes  mellitus type 2 we will keep patient on sliding scale coverage for now.  Since patient had a left hip fracture will need close follow-up in inpatient status.   DVT prophylaxis: SCDs for now. Code Status: Full code as confirmed with patient's daughter and patient. Family Communication: Patient's daughter. Disposition Plan: May need rehab. Consults called: Orthopedics and neurologist. Admission status: Inpatient.   Rise Patience MD [facility] Pager [phone].  If 7PM-7AM, please contact night-coverage [email or web address] Password [facility]  02/08/2020, 6:14 AM

## 2020-02-08 NOTE — ED Provider Notes (Signed)
[facility] EMERGENCY DEPARTMENT Provider Note   CSN: [id number] Arrival date & time: 02/08/20  0209   History No chief complaint on file.   [name] is a 79 y.o. female.  The history is provided by the patient.  She got out of the shower and slipped and fell hitting the back of her head against the shower and falling on the floor.  She injured her left hip in the fall and was not able to get up.  She was brought in by ambulance.  EMS gave fentanyl for pain and, after the third dose, patient became hypotensive but blood pressure did come up after receiving IV fluids.  She denies other injury.  No past medical history on file.  There are no problems to display for this patient.   ** The histories are not reviewed yet. Please review them in the "History" navigator section and refresh this [place].   OB History   No obstetric history on file.     No family history on file.  Social History   Tobacco Use   Smoking status: Not on file  Substance Use Topics   Alcohol use: Not on file   Drug use: Not on file    Home Medications Prior to Admission medications   Not on File    Allergies    Patient has no allergy information on record.  Review of Systems   Review of Systems  All other systems reviewed and are negative.   Physical Exam Updated Vital Signs BP 103/71 (BP Location: Right Arm)    Pulse 90    Resp (!) 26    Ht 5\' 6"  (1.676 m)    Wt 85.7 kg    SpO2 99%    BMI 30.51 kg/m   Physical Exam Vitals and nursing note reviewed.   79 year old female, resting comfortably and in no acute distress. Vital signs are significant for elevated respiratory rate. Oxygen saturation is 99%, which is normal. Head is normocephalic.  Superficial laceration present on the scalp, not requiring sutures or staples.  PERRLA, EOMI. Oropharynx is clear. Neck is nontender without adenopathy or JVD. Back is nontender and there is no CVA tenderness. Lungs  are clear without rales, wheezes, or rhonchi. Chest is nontender. Heart has regular rate and rhythm without murmur. Abdomen is soft, flat, nontender without masses or hepatosplenomegaly and peristalsis is normoactive. Extremities: Left leg is shortened and externally rotated.  Status post bilateral foot surgery with both feet pointing outward.  2+ pretibial edema present. Skin is warm and dry without rash. Neurologic: Mental status is normal, cranial nerves are intact, there are no motor or sensory deficits.  ED Results / Procedures / Treatments   Labs (all labs ordered are listed, but only abnormal results are displayed) Labs Reviewed  I-STAT CHEM 8, ED - Abnormal; Notable for the following components:      Result Value   BUN 38 (*)    Creatinine, Ser 1.70 (*)    Glucose, Bld 173 (*)    All other components within normal limits  RESPIRATORY PANEL BY RT PCR (FLU A&B, COVID)  CBC WITH DIFFERENTIAL/PLATELET  PROTIME-INR  COMPREHENSIVE METABOLIC PANEL  TYPE AND SCREEN  ABO/RH    EKG EKG Interpretation  Date/Time:  Friday February 08 2020 02:34:52 EST Ventricular Rate:  84 PR Interval:    QRS Duration: 100 QT Interval:  364 QTC Calculation: 431 R Axis:   71 Text Interpretation: Sinus rhythm Borderline prolonged  PR interval Otherwise within normal limits No old tracing to compare Confirmed by Delora Fuel ([id number]) on 02/08/2020 2:42:20 AM   Radiology CT Head Wo Contrast  Result Date: 02/08/2020 CLINICAL DATA:  Head trauma EXAM: CT HEAD WITHOUT CONTRAST CT CERVICAL SPINE WITHOUT CONTRAST TECHNIQUE: Multidetector CT imaging of the head and cervical spine was performed following the standard protocol without intravenous contrast. Multiplanar CT image reconstructions of the cervical spine were also generated. COMPARISON:  CT head 11/26/2019 FINDINGS: CT HEAD FINDINGS Brain: Normal anatomic configuration of the brain. There is interval development of a small cortical infarct involving  the right frontal lobe involving the precentral gyrus, best seen on axial image # 22/3. No significant associated mass effect. No midline shift. Mild periventricular white matter changes are again identified in keeping with probable small vessel ischemia. No abnormal intra or extra-axial mass lesion. No evidence of acute intracranial hemorrhage. Ventricular size is normal. Cerebellum is unremarkable. Vascular: No asymmetric hyperdense vasculature at the skull base. Skull: Intact Sinuses/Orbits: Visualized paranasal sinuses are clear. Orbits are unremarkable. Other: Mastoid air cells and middle ear cavities are clear. CT CERVICAL SPINE FINDINGS Alignment: 3 mm anterolisthesis C4 upon C5 and C7 upon T1 is likely degenerative in nature. Otherwise normal cervical lordosis. Skull base and vertebrae: The craniocervical junction is unremarkable. The atlantodental interval is normal. No acute fracture of the cervical spine. No lytic or blastic bone lesion Soft tissues and spinal canal: No prevertebral fluid or swelling. No visible canal hematoma. Disc levels: Review of the sagittal images demonstrates preservation of vertebral body height. 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 Multiple erosions are seen at the base of the odontoid process likely related to synovial overgrowth. 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 The spinal canal appears widely patent. The prevertebral soft tissues are not thickened. Review of the axial images demonstrates multilevel uncovertebral and facet arthrosis resulting in multilevel neural foraminal narrowing, most severe on the left at C3-4 and on the right at C4-5 as well as mild central canal stenosis at C6-7. Upper chest: Unremarkable Other: Advanced atherosclerotic calcification is seen within the left carotid  bifurcation. The degree of stenosis is not well assessed on this noncontrast examination. IMPRESSION: No acute intracranial injury.  No calvarial fracture. 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 No acute fracture or listhesis of the cervical spine. Electronically Signed   By: Fidela Salisbury MD   On: 02/08/2020 03:25   CT Cervical Spine Wo Contrast  Result Date: 02/08/2020 CLINICAL DATA:  Head trauma EXAM: CT HEAD WITHOUT CONTRAST CT CERVICAL SPINE WITHOUT CONTRAST TECHNIQUE: Multidetector CT imaging of the head and cervical spine was performed following the standard protocol without intravenous contrast. Multiplanar CT image reconstructions of the cervical spine were also generated. COMPARISON:  CT head 11/26/2019 FINDINGS: CT HEAD FINDINGS Brain: Normal anatomic configuration of the brain. There is interval development of a small cortical infarct involving the right frontal lobe involving the precentral gyrus, best seen on axial image # 22/3. No significant associated mass effect. No midline shift. Mild periventricular white matter changes are again identified in keeping with probable small vessel ischemia. No abnormal intra or extra-axial mass lesion. No evidence of acute intracranial hemorrhage. Ventricular size is normal. Cerebellum is unremarkable. Vascular: No asymmetric hyperdense vasculature at the skull base. Skull: Intact  Sinuses/Orbits: Visualized paranasal sinuses are clear. Orbits are unremarkable. Other: Mastoid air cells and middle ear cavities are clear. CT CERVICAL SPINE FINDINGS Alignment: 3 mm anterolisthesis C4 upon C5 and C7 upon T1 is likely degenerative in nature. Otherwise normal cervical lordosis. Skull base and vertebrae: The craniocervical junction is unremarkable. The atlantodental interval is normal. No acute fracture of the cervical spine. No lytic or blastic bone lesion Soft tissues and spinal canal:  No prevertebral fluid or swelling. No visible canal hematoma. Disc levels: Review of the sagittal images demonstrates preservation of vertebral body height. 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 Multiple erosions are seen at the base of the odontoid process likely related to synovial overgrowth. 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 The spinal canal appears widely patent. The prevertebral soft tissues are not thickened. Review of the axial images demonstrates multilevel uncovertebral and facet arthrosis resulting in multilevel neural foraminal narrowing, most severe on the left at C3-4 and on the right at C4-5 as well as mild central canal stenosis at C6-7. Upper chest: Unremarkable Other: Advanced atherosclerotic calcification is seen within the left carotid bifurcation. The degree of stenosis is not well assessed on this noncontrast examination. IMPRESSION: No acute intracranial injury.  No calvarial fracture. 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 No acute fracture or listhesis of the cervical spine. Electronically Signed   By: Fidela Salisbury MD   On: 02/08/2020 03:25   DG Pelvis Portable  Result Date: 02/08/2020 CLINICAL DATA:  Pelvic trauma EXAM: PORTABLE PELVIS 1-2 VIEWS COMPARISON:  None. FINDINGS: Single view radiograph the pelvis demonstrates an acute, left subcapital femoral neck fracture with superior displacement, varus angulation, and external rotation of the distal fracture fragment. The femoral head is still seated within the left acetabulum. Mild bilateral hip joint space narrowing is present in keeping with mild bilateral degenerative arthritis. Pelvis and sacrum appear intact. Limited evaluation of the right hip is unremarkable.  IMPRESSION: Acute, displaced, angulated subcapital left femoral neck fracture. Electronically Signed   By: Fidela Salisbury MD   On: 02/08/2020 02:44   DG Chest Port 1 View  Result Date: 02/08/2020 CLINICAL DATA:  Chest trauma EXAM: PORTABLE CHEST 1 VIEW COMPARISON:  10/04/2018 FINDINGS: The heart size and mediastinal contours are within normal limits. Both lungs are clear. The visualized skeletal structures are unremarkable. IMPRESSION: No active disease. Electronically Signed   By: Fidela Salisbury MD   On: 02/08/2020 02:42    Procedures Procedures  CRITICAL CARE Performed by: Delora Fuel Total critical care time: 40 minutes Critical care time was exclusive of separately billable procedures and treating other patients. Critical care was necessary to treat or prevent imminent or life-threatening deterioration. Critical care was time spent personally by me on the following activities: development of treatment plan with patient and/or surrogate as well as nursing, discussions with consultants, evaluation of patient's response to treatment, examination of patient, obtaining history from patient or surrogate, ordering and performing treatments and interventions, ordering and review of laboratory studies, ordering and review of radiographic studies, pulse oximetry and re-evaluation of patient's condition.  Medications Ordered in ED Medications  morphine 2 MG/ML injection 2 mg (has no administration in time range)  ondansetron (ZOFRAN) injection 4 mg (has no administration in time range)    ED Course  I have  reviewed the triage vital signs and the nursing notes.  Pertinent labs & imaging results that were available during my care of the patient were reviewed by me and considered in my medical decision making (see chart for details).  MDM Rules/Calculators/A&P Fall at home with injury to left hip and to occiput.  Patient was transported as a level 2 trauma because of fall and patient on  anticoagulants.  However, she is actually just on clopidogrel and not a systemic anticoagulant.  Portable pelvis x-[place] shows left femoral neck fracture with significant angulation.  She is being sent for CT of head and cervical spine.  Old records are reviewed, and she has prior ED visits for falls.  Tdap booster given at ED visit for fall in August of this year.  CT scans show no intracranial injury, no C-spine injury.  Labs show stable renal insufficiency.  Case is discussed with Dr. Griffin Basil of orthopedic surgery who states that he will see the patient in consultation for surgical management of hip fracture.  Case is discussed with Dr. Hal Hope of [facility], who agrees to admit the patient.  Final Clinical Impression(s) / ED Diagnoses Final diagnoses:  Closed fracture of neck of left femur, initial encounter ([place])  Fall on same level from slipping, initial encounter  Renal insufficiency    Rx / DC Orders ED Discharge Orders    None       Delora Fuel, MD 76/16/07 [phone]

## 2020-02-08 NOTE — Progress Notes (Signed)
Orthopedic Tech Progress Note Patient Details:  [name] 07-Sep-1940 [id number] Level 2 Trauma  Patient ID: [name], female   DOB: 1940/11/21, 79 y.o.   MRN: [id number]   [name] 02/08/2020, 2:37 AM

## 2020-02-08 NOTE — Consult Note (Signed)
Reason for Consult:subdural hematoma, right frontal Referring Physician: [name] is an 79 y.o. female.  HPI: whom fell this am. Initial CT head was read as not showing acute problems. MRI later was read as showing a right frontal subdural hematoma measuring 72mm in width. We were called for surgical management. She is alert and oriented. She was also admitted this am at [phone]. We were contacted after 0930 this am. She sustained a left hip fracture with the fall.   Past Medical History:  Diagnosis Date  . Coronary artery disease   . Diabetes mellitus without complication [facility])     Past Surgical History:  Procedure Laterality Date  . CARDIAC CATHETERIZATION    . CORONARY ANGIOPLASTY      Family History  Family history unknown: Yes    Social History:  reports that she has never smoked. She has never used smokeless tobacco. She reports that she does not drink alcohol and does not use drugs.  Allergies: No Known Allergies  Medications: I have reviewed the patient's current medications.  Results for orders placed or performed during the hospital encounter of 02/08/20 (from the past 48 hour(s))  Comprehensive metabolic panel     Status: Abnormal   Collection Time: 02/08/20  2:35 AM  Result Value Ref Range   Sodium 138 135 - 145 mmol/L   Potassium 4.9 3.5 - 5.1 mmol/L   Chloride 99 98 - 111 mmol/L   CO2 29 22 - 32 mmol/L   Glucose, Bld 179 (H) 70 - 99 mg/dL    Comment: Glucose reference range applies only to samples taken after fasting for at least 8 hours.   BUN 29 (H) 8 - 23 mg/dL   Creatinine, Ser 1.77 (H) 0.44 - 1.00 mg/dL   Calcium 10.0 8.9 - 10.3 mg/dL   Total Protein 7.2 6.5 - 8.1 g/dL   Albumin 3.8 3.5 - 5.0 g/dL   AST 44 (H) 15 - 41 U/L   ALT 29 0 - 44 U/L   Alkaline Phosphatase 104 38 - 126 U/L   Total Bilirubin 0.5 0.3 - 1.2 mg/dL   GFR, Estimated 29 (L) >60 mL/min    Comment: (NOTE) Calculated using the CKD-EPI Creatinine Equation (2021)    Anion  gap 10 5 - 15    Comment: Performed at [facility] [address], [place], [place] [address]  CBC with Differential     Status: None   Collection Time: 02/08/20  2:35 AM  Result Value Ref Range   WBC 8.1 4.0 - 10.5 K/uL   RBC 4.61 3.87 - 5.11 MIL/uL   Hemoglobin 13.3 12.0 - 15.0 g/dL   HCT 43.4 36 - 46 %   MCV 94.1 80.0 - 100.0 fL   MCH 28.9 26.0 - 34.0 pg   MCHC 30.6 30.0 - 36.0 g/dL   RDW 14.6 11.5 - 15.5 %   Platelets 217 150 - 400 K/uL    Comment: REPEATED TO VERIFY   nRBC 0.0 0.0 - 0.2 %   Neutrophils Relative % 77 %   Neutro Abs 6.4 1.7 - 7.7 K/uL   Lymphocytes Relative 13 %   Lymphs Abs 1.1 0.7 - 4.0 K/uL   Monocytes Relative 6 %   Monocytes Absolute 0.5 0.1 - 1.0 K/uL   Eosinophils Relative 2 %   Eosinophils Absolute 0.1 0.0 - 0.5 K/uL   Basophils Relative 1 %   Basophils Absolute 0.0 0.0 - 0.1 K/uL   Immature  Granulocytes 1 %   Abs Immature Granulocytes 0.05 0.00 - 0.07 K/uL    Comment: Performed at [facility], [place] [address] [place], [place] [address]  Protime-INR     Status: None   Collection Time: 02/08/20  2:35 AM  Result Value Ref Range   Prothrombin Time 13.3 11.4 - 15.2 seconds   INR 1.1 0.8 - 1.2    Comment: (NOTE) INR goal varies based on device and disease states. Performed at [facility], [place] [address], [place], [place] [address]   Type and screen [place]     Status: None (Preliminary result)   Collection Time: 02/08/20  2:35 AM  Result Value Ref Range   ABO/RH(D) A POS    Antibody Screen POS    Sample Expiration 02/11/2020,2359    Antibody Identification ANTI JKA (Kidd a)    Unit Number [id number]    Blood Component Type RED CELLS,LR    Unit division 00    Status of Unit ALLOCATED    Donor AG Type NEGATIVE FOR KIDD A ANTIGEN    Transfusion Status OK TO TRANSFUSE    Crossmatch Result COMPATIBLE    Unit Number [id number]    Blood Component Type RED CELLS,LR    Unit division 00    Status of  Unit ALLOCATED    Donor AG Type NEGATIVE FOR KIDD A ANTIGEN    Transfusion Status OK TO TRANSFUSE    Crossmatch Result COMPATIBLE   I-stat chem 8, ed     Status: Abnormal   Collection Time: 02/08/20  2:41 AM  Result Value Ref Range   Sodium 137 135 - 145 mmol/L   Potassium 4.9 3.5 - 5.1 mmol/L   Chloride 99 98 - 111 mmol/L   BUN 38 (H) 8 - 23 mg/dL   Creatinine, Ser 1.70 (H) 0.44 - 1.00 mg/dL   Glucose, Bld 173 (H) 70 - 99 mg/dL    Comment: Glucose reference range applies only to samples taken after fasting for at least 8 hours.   Calcium, Ion 1.16 1.15 - 1.40 mmol/L   TCO2 28 22 - 32 mmol/L   Hemoglobin 14.6 12.0 - 15.0 g/dL   HCT 43.0 36 - 46 %  Respiratory Panel by RT PCR (Flu A&B, Covid) - Nasopharyngeal Swab     Status: None   Collection Time: 02/08/20  3:42 AM   Specimen: Nasopharyngeal Swab  Result Value Ref Range   SARS Coronavirus 2 by RT PCR NEGATIVE NEGATIVE    Comment: (NOTE) SARS-CoV-2 target nucleic acids are NOT DETECTED.  The SARS-CoV-2 RNA is generally detectable in upper respiratoy specimens during the acute phase of infection. The lowest concentration of SARS-CoV-2 viral copies this assay can detect is 131 copies/mL. A negative result does not preclude SARS-Cov-2 infection and should not be used as the sole basis for treatment or other patient management decisions. A negative result may occur with  improper specimen collection/handling, submission of specimen other than nasopharyngeal swab, presence of viral mutation(s) within the areas targeted by this assay, and inadequate number of viral copies (<131 copies/mL). A negative result must be combined with clinical observations, patient history, and epidemiological information. The expected result is Negative.  Fact Sheet for Patients:  PinkCheek.be  Fact Sheet for Healthcare Providers:  GravelBags.it  This test is no t yet approved or cleared by  the [place] FDA and  has been authorized for detection and/or diagnosis of SARS-CoV-2 by FDA under an Emergency  Use Authorization (EUA). This EUA will remain  in effect (meaning this test can be used) for the duration of the COVID-19 declaration under Section 564(b)(1) of the Act, 21 U.S.C. section 360bbb-3(b)(1), unless the authorization is terminated or revoked sooner.     Influenza A by PCR NEGATIVE NEGATIVE   Influenza B by PCR NEGATIVE NEGATIVE    Comment: (NOTE) The Xpert Xpress SARS-CoV-2/FLU/RSV assay is intended as an aid in  the diagnosis of influenza from Nasopharyngeal swab specimens and  should not be used as a sole basis for treatment. Nasal washings and  aspirates are unacceptable for Xpert Xpress SARS-CoV-2/FLU/RSV  testing.  Fact Sheet for Patients: PinkCheek.be  Fact Sheet for Healthcare Providers: GravelBags.it  This test is not yet approved or cleared by the [place] FDA and  has been authorized for detection and/or diagnosis of SARS-CoV-2 by  FDA under an Emergency Use Authorization (EUA). This EUA will remain  in effect (meaning this test can be used) for the duration of the  Covid-19 declaration under Section 564(b)(1) of the Act, 21  U.S.C. section 360bbb-3(b)(1), unless the authorization is  terminated or revoked. Performed at [facility], [place] [address] [place], [place] [address]   CBG monitoring, ED     Status: Abnormal   Collection Time: 02/08/20  7:38 AM  Result Value Ref Range   Glucose-Capillary 122 (H) 70 - 99 mg/dL    Comment: Glucose reference range applies only to samples taken after fasting for at least 8 hours.  Hemoglobin A1c     Status: Abnormal   Collection Time: 02/08/20  7:53 AM  Result Value Ref Range   Hgb A1c MFr Bld 8.8 (H) 4.8 - 5.6 %    Comment: (NOTE) Pre diabetes:          5.7%-6.4%  Diabetes:              >6.4%  Glycemic control for    <7.0% adults with diabetes    Mean Plasma Glucose 205.86 mg/dL    Comment: Performed at [facility] [address] [place], [place] [address]    CT Head Wo Contrast  Result Date: 02/08/2020 CLINICAL DATA:  Head trauma EXAM: CT HEAD WITHOUT CONTRAST CT CERVICAL SPINE WITHOUT CONTRAST TECHNIQUE: Multidetector CT imaging of the head and cervical spine was performed following the standard protocol without intravenous contrast. Multiplanar CT image reconstructions of the cervical spine were also generated. COMPARISON:  CT head 11/26/2019 FINDINGS: CT HEAD FINDINGS Brain: Normal anatomic configuration of the brain. There is interval development of a small cortical infarct involving the right frontal lobe involving the precentral gyrus, best seen on axial image # 22/3. No significant associated mass effect. No midline shift. Mild periventricular white matter changes are again identified in keeping with probable small vessel ischemia. No abnormal intra or extra-axial mass lesion. No evidence of acute intracranial hemorrhage. Ventricular size is normal. Cerebellum is unremarkable. Vascular: No asymmetric hyperdense vasculature at the skull base. Skull: Intact Sinuses/Orbits: Visualized paranasal sinuses are clear. Orbits are unremarkable. Other: Mastoid air cells and middle ear cavities are clear. CT CERVICAL SPINE FINDINGS Alignment: 3 mm anterolisthesis C4 upon C5 and C7 upon T1 is likely degenerative in nature. Otherwise normal cervical lordosis. Skull base and vertebrae: The craniocervical junction is unremarkable. The atlantodental interval is normal. No acute fracture of the cervical spine. No lytic or blastic bone lesion Soft tissues and spinal canal: No prevertebral fluid or swelling. No visible canal hematoma. Disc  levels: Review of the sagittal images demonstrates preservation of vertebral body height. 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 Multiple erosions are seen at the base  of the odontoid process likely related to synovial overgrowth. 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 The spinal canal appears widely patent. The prevertebral soft tissues are not thickened. Review of the axial images demonstrates multilevel uncovertebral and facet arthrosis resulting in multilevel neural foraminal narrowing, most severe on the left at C3-4 and on the right at C4-5 as well as mild central canal stenosis at C6-7. Upper chest: Unremarkable Other: Advanced atherosclerotic calcification is seen within the left carotid bifurcation. The degree of stenosis is not well assessed on this noncontrast examination. IMPRESSION: No acute intracranial injury.  No calvarial fracture. 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 No acute fracture or listhesis of the cervical spine. Electronically Signed   By: Fidela Salisbury MD   On: 02/08/2020 03:25   CT Cervical Spine Wo Contrast  Result Date: 02/08/2020 CLINICAL DATA:  Head trauma EXAM: CT HEAD WITHOUT CONTRAST CT CERVICAL SPINE WITHOUT CONTRAST TECHNIQUE: Multidetector CT imaging of the head and cervical spine was performed following the standard protocol without intravenous contrast. Multiplanar CT image reconstructions of the cervical spine were also generated. COMPARISON:  CT head 11/26/2019 FINDINGS: CT HEAD FINDINGS Brain: Normal anatomic configuration of the brain. There is interval development of a small cortical infarct involving the right frontal lobe involving the precentral gyrus, best seen on axial image # 22/3. No significant associated mass effect. No midline shift. Mild periventricular white matter changes are again identified in keeping with probable small vessel ischemia. No abnormal intra or extra-axial mass  lesion. No evidence of acute intracranial hemorrhage. Ventricular size is normal. Cerebellum is unremarkable. Vascular: No asymmetric hyperdense vasculature at the skull base. Skull: Intact Sinuses/Orbits: Visualized paranasal sinuses are clear. Orbits are unremarkable. Other: Mastoid air cells and middle ear cavities are clear. CT CERVICAL SPINE FINDINGS Alignment: 3 mm anterolisthesis C4 upon C5 and C7 upon T1 is likely degenerative in nature. Otherwise normal cervical lordosis. Skull base and vertebrae: The craniocervical junction is unremarkable. The atlantodental interval is normal. No acute fracture of the cervical spine. No lytic or blastic bone lesion Soft tissues and spinal canal: No prevertebral fluid or swelling. No visible canal hematoma. Disc levels: Review of the sagittal images demonstrates preservation of vertebral body height. 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 Multiple erosions are seen at the base of the odontoid process likely related to synovial overgrowth. 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 The spinal canal appears widely patent. The prevertebral soft tissues are not thickened. Review of the axial images demonstrates multilevel uncovertebral and facet arthrosis resulting in multilevel neural foraminal narrowing, most severe on the left at C3-4 and on the right at C4-5 as well as mild central canal stenosis at C6-7. Upper chest: Unremarkable Other: Advanced atherosclerotic calcification is seen within the left carotid bifurcation. The degree of stenosis is not well assessed on this noncontrast examination. IMPRESSION: No acute intracranial injury.  No calvarial fracture. 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 No acute fracture  or  listhesis of the cervical spine. Electronically Signed   By: Fidela Salisbury MD   On: 02/08/2020 03:25   MR ANGIO HEAD WO CONTRAST  Result Date: 02/08/2020 CLINICAL DATA:  Neuro deficit, acute, stroke suspected. Additional history provided: Fall at home. EXAM: MRI HEAD WITHOUT CONTRAST MRA HEAD WITHOUT CONTRAST TECHNIQUE: Multiplanar, multiecho pulse sequences of the brain and surrounding structures were obtained without intravenous contrast. Angiographic images of the head were obtained using MRA technique without contrast. COMPARISON:  Noncontrast head CT 02/08/2020. Brain MRI 11/06/2019. MRA head 09/26/2009. FINDINGS: MRI HEAD FINDINGS Brain: Cerebral volume is normal for age. There is a thin acute subdural hematoma overlying the anterolateral right frontal lobe measuring up to 4 mm in greatest thickness (for instance as seen on series 11, image 13). In retrospect, this is appreciable on the head CT performed earlier the same day. No significant mass effect upon the underlying right cerebral hemisphere. No midline shift. Moderate multifocal T2/FLAIR hyperintensity within the cerebral white matter and pons is nonspecific, but compatible with chronic small vessel ischemic disease. Unchanged 5 mm rounded extra-axial calcified focus overlying the anterior left frontal lobe, suspected small incidental meningioma. There is no acute infarct. No extra-axial fluid collection. Vascular: Reported below. Skull and upper cervical spine: No focal marrow lesion. Cervical spondylosis. Ligamentous hypertrophy/pannus formation posterior to the dens. Sinuses/Orbits: Prior lens replacements. Trace ethmoid sinus mucosal thickening. Other: Left parietooccipital scalp soft tissue swelling. MRA HEAD FINDINGS The intracranial internal carotid arteries are patent. Moderate/severe atherosclerotic narrowing of the cavernous segments bilaterally, progressed as compared to the MRA of 09/26/2009. No M2 proximal branch occlusion.  Atherosclerotic irregularity of the M2 and more distal MCA branch vessels bilaterally. Most notably, there is a moderate stenosis within a proximal M2 left MCA branch (series 1051, image 4). The anterior cerebral arteries are patent. The intracranial vertebral arteries are patent. The basilar artery is patent. The posterior cerebral arteries are patent. Mild atherosclerotic narrowing of the proximal left PCA (series 1039, image 15). Mild/moderate narrowing of the left PCA at the P3/P4 junction. Posterior communicating arteries are present bilaterally. These results were called by telephone at the time of interpretation on 02/08/2020 at 7:48 am to provider Dr. Roslynn Amble, who verbally acknowledged these results. IMPRESSION: MRI brain: 1. Thin acute subdural hematoma overlying the anterolateral right frontal lobe, measuring up to 4 mm in thickness. 2. Moderate chronic small vessel ischemic disease, stable as compared to the brain MRI of 11/06/2019. 3. 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 4. Left parietooccipital scalp soft tissue swelling. MRA head: 1. No intracranial large vessel occlusion. 2. Intracranial atherosclerotic disease most notably as follows. 3. 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 4. Moderate focal stenosis within a proximal M2 left MCA branch vessel. 5. Mild/moderate stenosis of the left PCA at the P3/P4 junction. Electronically Signed   By: Kellie Simmering DO   On: 02/08/2020 07:49   MR ANGIO NECK WO CONTRAST  Result Date: 02/08/2020 CLINICAL DATA:  Stroke, follow-up. EXAM: MRA NECK WITHOUT CONTRAST TECHNIQUE: Angiographic images of the neck were obtained using MRA technique without intravenous contrast. Carotid stenosis measurements (when applicable) are obtained utilizing NASCET criteria, using the distal internal carotid diameter as the denominator.  COMPARISON:  MRA neck 09/26/2009. FINDINGS: The right CCA and cervical ICA are patent without hemodynamically significant stenosis. Mild atherosclerotic irregularity of the proximal right ICA, progressed as compared to the MRA  of 09/26/2009. The left CCA and cervical ICA are patent. On the source time-of-flight sequence, there is apparent 60% stenosis at the origin of the left ICA, progressed as compared to the prior exam. However, this degree of stenosis is not definitively appreciable on the MIP reconstructions. Limited evaluation of the proximal vertebral arteries due to motion degradation and noncontrast technique. The vertebral arteries are otherwise patent within the neck without hemodynamically significant stenosis. IMPRESSION: 1. Apparent 60% stenosis at the origin of the left ICA on the source time-of-flight sequence, progressed from the MRA of 09/26/2009. However, this degree of stenosis is not definitively appreciated on the MIP reconstructions. Carotid artery duplex may be obtained for further evaluation, as clinically warranted. 2. The right CCA and ICA are patent within the neck without hemodynamically significant stenosis. Progressive mild atherosclerotic irregularity of the proximal right ICA. 3. Motion degradation and non-contrast technique preclude evaluation of the proximal vertebral arteries. The vertebral arteries are otherwise patent within the neck without significant stenosis. Electronically Signed   By: Kellie Simmering DO   On: 02/08/2020 08:04   MR BRAIN WO CONTRAST  Result Date: 02/08/2020 CLINICAL DATA:  Neuro deficit, acute, stroke suspected. Additional history provided: Fall at home. EXAM: MRI HEAD WITHOUT CONTRAST MRA HEAD WITHOUT CONTRAST TECHNIQUE: Multiplanar, multiecho pulse sequences of the brain and surrounding structures were obtained without intravenous contrast. Angiographic images of the head were obtained using MRA technique without contrast. COMPARISON:  Noncontrast head  CT 02/08/2020. Brain MRI 11/06/2019. MRA head 09/26/2009. FINDINGS: MRI HEAD FINDINGS Brain: Cerebral volume is normal for age. There is a thin acute subdural hematoma overlying the anterolateral right frontal lobe measuring up to 4 mm in greatest thickness (for instance as seen on series 11, image 13). In retrospect, this is appreciable on the head CT performed earlier the same day. No significant mass effect upon the underlying right cerebral hemisphere. No midline shift. Moderate multifocal T2/FLAIR hyperintensity within the cerebral white matter and pons is nonspecific, but compatible with chronic small vessel ischemic disease. Unchanged 5 mm rounded extra-axial calcified focus overlying the anterior left frontal lobe, suspected small incidental meningioma. There is no acute infarct. No extra-axial fluid collection. Vascular: Reported below. Skull and upper cervical spine: No focal marrow lesion. Cervical spondylosis. Ligamentous hypertrophy/pannus formation posterior to the dens. Sinuses/Orbits: Prior lens replacements. Trace ethmoid sinus mucosal thickening. Other: Left parietooccipital scalp soft tissue swelling. MRA HEAD FINDINGS The intracranial internal carotid arteries are patent. Moderate/severe atherosclerotic narrowing of the cavernous segments bilaterally, progressed as compared to the MRA of 09/26/2009. No M2 proximal branch occlusion. Atherosclerotic irregularity of the M2 and more distal MCA branch vessels bilaterally. Most notably, there is a moderate stenosis within a proximal M2 left MCA branch (series 1051, image 4). The anterior cerebral arteries are patent. The intracranial vertebral arteries are patent. The basilar artery is patent. The posterior cerebral arteries are patent. Mild atherosclerotic narrowing of the proximal left PCA (series 1039, image 15). Mild/moderate narrowing of the left PCA at the P3/P4 junction. Posterior communicating arteries are present bilaterally. These results  were called by telephone at the time of interpretation on 02/08/2020 at 7:48 am to provider Dr. Roslynn Amble, who verbally acknowledged these results. IMPRESSION: MRI brain: 1. Thin acute subdural hematoma overlying the anterolateral right frontal lobe, measuring up to 4 mm in thickness. 2. Moderate chronic small vessel ischemic disease, stable as compared to the brain MRI of 11/06/2019. 3. 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 4.  Left parietooccipital scalp soft tissue swelling. MRA head: 1. No intracranial large vessel occlusion. 2. Intracranial atherosclerotic disease most notably as follows. 3. 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 4. Moderate focal stenosis within a proximal M2 left MCA branch vessel. 5. Mild/moderate stenosis of the left PCA at the P3/P4 junction. Electronically Signed   By: Kellie Simmering DO   On: 02/08/2020 07:49   DG Pelvis Portable  Result Date: 02/08/2020 CLINICAL DATA:  Pelvic trauma EXAM: PORTABLE PELVIS 1-2 VIEWS COMPARISON:  None. FINDINGS: Single view radiograph the pelvis demonstrates an acute, left subcapital femoral neck fracture with superior displacement, varus angulation, and external rotation of the distal fracture fragment. The femoral head is still seated within the left acetabulum. Mild bilateral hip joint space narrowing is present in keeping with mild bilateral degenerative arthritis. Pelvis and sacrum appear intact. Limited evaluation of the right hip is unremarkable. IMPRESSION: Acute, displaced, angulated subcapital left femoral neck fracture. Electronically Signed   By: Fidela Salisbury MD   On: 02/08/2020 02:44   DG Chest Port 1 View  Result Date: 02/08/2020 CLINICAL DATA:  Chest trauma EXAM: PORTABLE CHEST 1 VIEW COMPARISON:  10/04/2018 FINDINGS: The heart size and mediastinal contours are within normal limits. Both lungs  are clear. The visualized skeletal structures are unremarkable. IMPRESSION: No active disease. Electronically Signed   By: Fidela Salisbury MD   On: 02/08/2020 02:42    Review of Systems Blood pressure 129/71, pulse 89, resp. rate (!) 24, height 5\' 6"  (1.676 m), weight 85.7 kg, SpO2 100 %. Physical Exam Constitutional:      General: She is in acute distress.     Appearance: Normal appearance.  HENT:     Head: Normocephalic and atraumatic.     Nose: Nose normal.     Mouth/Throat:     Mouth: Mucous membranes are moist.     Pharynx: Oropharynx is clear.  Eyes:     Extraocular Movements: Extraocular movements intact.     Conjunctiva/sclera: Conjunctivae normal.     Pupils: Pupils are equal, round, and reactive to light.  Cardiovascular:     Rate and Rhythm: Normal rate and regular rhythm.     Pulses: Normal pulses.  Pulmonary:     Effort: Pulmonary effort is normal.     Breath sounds: Normal breath sounds.  Abdominal:     General: Abdomen is flat.     Palpations: Abdomen is soft.  Musculoskeletal:     Cervical back: Normal range of motion.  Neurological:     General: No focal deficit present.     Mental Status: She is alert and oriented to person, place, and time. Mental status is at baseline.     GCS: GCS eye subscore is 4. GCS verbal subscore is 5. GCS motor subscore is 6.     Sensory: Sensation is intact.     Motor: Motor function is intact.     Coordination: Coordination is intact.     Deep Tendon Reflexes: Babinski sign absent on the right side. Babinski sign absent on the left side.     Comments: Decreased hearing.  Symmetric facies, tongue and uvula midline Normal shoulder shrug. With exception left hip, no motor problems appreciated.      Assessment/Plan: There is no surgical issue. No repeat scan should be done. It is questionable if this hematoma exists. Supportive care with no special recommendations regarding the possible subdural hematoma. Will sign off.  Ashok Pall 02/08/2020, 10:42 AM

## 2020-02-08 NOTE — Consult Note (Signed)
Reason for Consult:Left hip fx Referring Physician: D Jissel [name] is an 79 y.o. female.  HPI: [name] was at home getting dressed after a shower and slipped on the floor. She fell backwards and onto her left side. She had immediate pain and could not get up. She was brought to the ED where x-rays showed a left hip fx and orthopedic surgery was consulted. She c/o localized pain to the hip. She has undergone a stroke workup (negative) and is awaiting NS consultation for a small SDH.  Past Medical History:  Diagnosis Date  . Coronary artery disease   . Diabetes mellitus without complication [facility])     Past Surgical History:  Procedure Laterality Date  . CARDIAC CATHETERIZATION    . CORONARY ANGIOPLASTY      Family History  Family history unknown: Yes    Social History:  reports that she has never smoked. She has never used smokeless tobacco. She reports that she does not drink alcohol and does not use drugs.  Allergies: No Known Allergies  Medications: I have reviewed the patient's current medications.  Results for orders placed or performed during the hospital encounter of 02/08/20 (from the past 48 hour(s))  Comprehensive metabolic panel     Status: Abnormal   Collection Time: 02/08/20  2:35 AM  Result Value Ref Range   Sodium 138 135 - 145 mmol/L   Potassium 4.9 3.5 - 5.1 mmol/L   Chloride 99 98 - 111 mmol/L   CO2 29 22 - 32 mmol/L   Glucose, Bld 179 (H) 70 - 99 mg/dL    Comment: Glucose reference range applies only to samples taken after fasting for at least 8 hours.   BUN 29 (H) 8 - 23 mg/dL   Creatinine, Ser 1.77 (H) 0.44 - 1.00 mg/dL   Calcium 10.0 8.9 - 10.3 mg/dL   Total Protein 7.2 6.5 - 8.1 g/dL   Albumin 3.8 3.5 - 5.0 g/dL   AST 44 (H) 15 - 41 U/L   ALT 29 0 - 44 U/L   Alkaline Phosphatase 104 38 - 126 U/L   Total Bilirubin 0.5 0.3 - 1.2 mg/dL   GFR, Estimated 29 (L) >60 mL/min    Comment: (NOTE) Calculated using the CKD-EPI Creatinine Equation  (2021)    Anion gap 10 5 - 15    Comment: Performed at [facility] [address] [place], [place] [address]  CBC with Differential     Status: None   Collection Time: 02/08/20  2:35 AM  Result Value Ref Range   WBC 8.1 4.0 - 10.5 K/uL   RBC 4.61 3.87 - 5.11 MIL/uL   Hemoglobin 13.3 12.0 - 15.0 g/dL   HCT 43.4 36 - 46 %   MCV 94.1 80.0 - 100.0 fL   MCH 28.9 26.0 - 34.0 pg   MCHC 30.6 30.0 - 36.0 g/dL   RDW 14.6 11.5 - 15.5 %   Platelets 217 150 - 400 K/uL    Comment: REPEATED TO VERIFY   nRBC 0.0 0.0 - 0.2 %   Neutrophils Relative % 77 %   Neutro Abs 6.4 1.7 - 7.7 K/uL   Lymphocytes Relative 13 %   Lymphs Abs 1.1 0.7 - 4.0 K/uL   Monocytes Relative 6 %   Monocytes Absolute 0.5 0.1 - 1.0 K/uL   Eosinophils Relative 2 %   Eosinophils Absolute 0.1 0.0 - 0.5 K/uL   Basophils Relative 1 %   Basophils Absolute 0.0  0.0 - 0.1 K/uL   Immature Granulocytes 1 %   Abs Immature Granulocytes 0.05 0.00 - 0.07 K/uL    Comment: Performed at [facility], [place] [address] [place], [place] [address]  Protime-INR     Status: None   Collection Time: 02/08/20  2:35 AM  Result Value Ref Range   Prothrombin Time 13.3 11.4 - 15.2 seconds   INR 1.1 0.8 - 1.2    Comment: (NOTE) INR goal varies based on device and disease states. Performed at [facility], [place] [address] [place], [place] [address]   Type and screen [place]     Status: None (Preliminary result)   Collection Time: 02/08/20  2:35 AM  Result Value Ref Range   ABO/RH(Kristen) A POS    Antibody Screen POS    Sample Expiration 02/11/2020,2359    Antibody Identification ANTI JKA (Kidd a)    Unit Number [id number]    Blood Component Type RED CELLS,LR    Unit division 00    Status of Unit ALLOCATED    Donor AG Type NEGATIVE FOR KIDD A ANTIGEN    Transfusion Status OK TO TRANSFUSE    Crossmatch Result COMPATIBLE    Unit Number [id number]    Blood Component Type RED CELLS,LR    Unit division  00    Status of Unit ALLOCATED    Donor AG Type NEGATIVE FOR KIDD A ANTIGEN    Transfusion Status OK TO TRANSFUSE    Crossmatch Result COMPATIBLE   I-stat chem 8, ed     Status: Abnormal   Collection Time: 02/08/20  2:41 AM  Result Value Ref Range   Sodium 137 135 - 145 mmol/L   Potassium 4.9 3.5 - 5.1 mmol/L   Chloride 99 98 - 111 mmol/L   BUN 38 (H) 8 - 23 mg/dL   Creatinine, Ser 1.70 (H) 0.44 - 1.00 mg/dL   Glucose, Bld 173 (H) 70 - 99 mg/dL    Comment: Glucose reference range applies only to samples taken after fasting for at least 8 hours.   Calcium, Ion 1.16 1.15 - 1.40 mmol/L   TCO2 28 22 - 32 mmol/L   Hemoglobin 14.6 12.0 - 15.0 g/dL   HCT 43.0 36 - 46 %  Respiratory Panel by RT PCR (Flu A&B, Covid) - Nasopharyngeal Swab     Status: None   Collection Time: 02/08/20  3:42 AM   Specimen: Nasopharyngeal Swab  Result Value Ref Range   SARS Coronavirus 2 by RT PCR NEGATIVE NEGATIVE    Comment: (NOTE) SARS-CoV-2 target nucleic acids are NOT DETECTED.  The SARS-CoV-2 RNA is generally detectable in upper respiratoy specimens during the acute phase of infection. The lowest concentration of SARS-CoV-2 viral copies this assay can detect is 131 copies/mL. A negative result does not preclude SARS-Cov-2 infection and should not be used as the sole basis for treatment or other patient management decisions. A negative result may occur with  improper specimen collection/handling, submission of specimen other than nasopharyngeal swab, presence of viral mutation(s) within the areas targeted by this assay, and inadequate number of viral copies (<131 copies/mL). A negative result must be combined with clinical observations, patient history, and epidemiological information. The expected result is Negative.  Fact Sheet for Patients:  PinkCheek.be  Fact Sheet for Healthcare Providers:  GravelBags.it  This test is no t yet approved  or cleared by the [place] FDA and  has been authorized for detection and/or diagnosis  of SARS-CoV-2 by FDA under an Emergency Use Authorization (EUA). This EUA will remain  in effect (meaning this test can be used) for the duration of the COVID-19 declaration under Section 564(b)(1) of the Act, 21 U.S.C. section 360bbb-3(b)(1), unless the authorization is terminated or revoked sooner.     Influenza A by PCR NEGATIVE NEGATIVE   Influenza B by PCR NEGATIVE NEGATIVE    Comment: (NOTE) The Xpert Xpress SARS-CoV-2/FLU/RSV assay is intended as an aid in  the diagnosis of influenza from Nasopharyngeal swab specimens and  should not be used as a sole basis for treatment. Nasal washings and  aspirates are unacceptable for Xpert Xpress SARS-CoV-2/FLU/RSV  testing.  Fact Sheet for Patients: PinkCheek.be  Fact Sheet for Healthcare Providers: GravelBags.it  This test is not yet approved or cleared by the [place] FDA and  has been authorized for detection and/or diagnosis of SARS-CoV-2 by  FDA under an Emergency Use Authorization (EUA). This EUA will remain  in effect (meaning this test can be used) for the duration of the  Covid-19 declaration under Section 564(b)(1) of the Act, 21  U.S.C. section 360bbb-3(b)(1), unless the authorization is  terminated or revoked. Performed at [facility], [place] [address] [place], [place] [address]   CBG monitoring, ED     Status: Abnormal   Collection Time: 02/08/20  7:38 AM  Result Value Ref Range   Glucose-Capillary 122 (H) 70 - 99 mg/dL    Comment: Glucose reference range applies only to samples taken after fasting for at least 8 hours.  Hemoglobin A1c     Status: Abnormal   Collection Time: 02/08/20  7:53 AM  Result Value Ref Range   Hgb A1c MFr Bld 8.8 (H) 4.8 - 5.6 %    Comment: (NOTE) Pre diabetes:          5.7%-6.4%  Diabetes:              >6.4%  Glycemic control  for   <7.0% adults with diabetes    Mean Plasma Glucose 205.86 mg/dL    Comment: Performed at [facility] [address], [place], [place] [address]    CT Head Wo Contrast  Result Date: 02/08/2020 CLINICAL DATA:  Head trauma EXAM: CT HEAD WITHOUT CONTRAST CT CERVICAL SPINE WITHOUT CONTRAST TECHNIQUE: Multidetector CT imaging of the head and cervical spine was performed following the standard protocol without intravenous contrast. Multiplanar CT image reconstructions of the cervical spine were also generated. COMPARISON:  CT head 11/26/2019 FINDINGS: CT HEAD FINDINGS Brain: Normal anatomic configuration of the brain. There is interval development of a small cortical infarct involving the right frontal lobe involving the precentral gyrus, best seen on axial image # 22/3. No significant associated mass effect. No midline shift. Mild periventricular white matter changes are again identified in keeping with probable small vessel ischemia. No abnormal intra or extra-axial mass lesion. No evidence of acute intracranial hemorrhage. Ventricular size is normal. Cerebellum is unremarkable. Vascular: No asymmetric hyperdense vasculature at the skull base. Skull: Intact Sinuses/Orbits: Visualized paranasal sinuses are clear. Orbits are unremarkable. Other: Mastoid air cells and middle ear cavities are clear. CT CERVICAL SPINE FINDINGS Alignment: 3 mm anterolisthesis C4 upon C5 and C7 upon T1 is likely degenerative in nature. Otherwise normal cervical lordosis. Skull base and vertebrae: The craniocervical junction is unremarkable. The atlantodental interval is normal. No acute fracture of the cervical spine. No lytic or blastic bone lesion Soft tissues and spinal canal: No prevertebral fluid  or swelling. No visible canal hematoma. Disc levels: Review of the sagittal images demonstrates preservation of vertebral body height. 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 Multiple erosions are seen at the  base of the odontoid process likely related to synovial overgrowth. 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 The spinal canal appears widely patent. The prevertebral soft tissues are not thickened. Review of the axial images demonstrates multilevel uncovertebral and facet arthrosis resulting in multilevel neural foraminal narrowing, most severe on the left at C3-4 and on the right at C4-5 as well as mild central canal stenosis at C6-7. Upper chest: Unremarkable Other: Advanced atherosclerotic calcification is seen within the left carotid bifurcation. The degree of stenosis is not well assessed on this noncontrast examination. IMPRESSION: No acute intracranial injury.  No calvarial fracture. 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 No acute fracture or listhesis of the cervical spine. Electronically Signed   By: Fidela Salisbury MD   On: 02/08/2020 03:25   CT Cervical Spine Wo Contrast  Result Date: 02/08/2020 CLINICAL DATA:  Head trauma EXAM: CT HEAD WITHOUT CONTRAST CT CERVICAL SPINE WITHOUT CONTRAST TECHNIQUE: Multidetector CT imaging of the head and cervical spine was performed following the standard protocol without intravenous contrast. Multiplanar CT image reconstructions of the cervical spine were also generated. COMPARISON:  CT head 11/26/2019 FINDINGS: CT HEAD FINDINGS Brain: Normal anatomic configuration of the brain. There is interval development of a small cortical infarct involving the right frontal lobe involving the precentral gyrus, best seen on axial image # 22/3. No significant associated mass effect. No midline shift. Mild periventricular white matter changes are again identified in keeping with probable small vessel ischemia. No abnormal intra or extra-axial  mass lesion. No evidence of acute intracranial hemorrhage. Ventricular size is normal. Cerebellum is unremarkable. Vascular: No asymmetric hyperdense vasculature at the skull base. Skull: Intact Sinuses/Orbits: Visualized paranasal sinuses are clear. Orbits are unremarkable. Other: Mastoid air cells and middle ear cavities are clear. CT CERVICAL SPINE FINDINGS Alignment: 3 mm anterolisthesis C4 upon C5 and C7 upon T1 is likely degenerative in nature. Otherwise normal cervical lordosis. Skull base and vertebrae: The craniocervical junction is unremarkable. The atlantodental interval is normal. No acute fracture of the cervical spine. No lytic or blastic bone lesion Soft tissues and spinal canal: No prevertebral fluid or swelling. No visible canal hematoma. Disc levels: Review of the sagittal images demonstrates preservation of vertebral body height. 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 Multiple erosions are seen at the base of the odontoid process likely related to synovial overgrowth. 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 The spinal canal appears widely patent. The prevertebral soft tissues are not thickened. Review of the axial images demonstrates multilevel uncovertebral and facet arthrosis resulting in multilevel neural foraminal narrowing, most severe on the left at C3-4 and on the right at C4-5 as well as mild central canal stenosis at C6-7. Upper chest: Unremarkable Other: Advanced atherosclerotic calcification is seen within the left carotid bifurcation. The degree of stenosis is not well assessed on this noncontrast examination. IMPRESSION: No acute intracranial injury.  No calvarial fracture. Interval development of a subacute to remote cortical infarct involving the precentral gyrus of  the right frontal lobe. No acute fracture  or listhesis of the cervical spine. Electronically Signed   By: Fidela Salisbury MD   On: 02/08/2020 03:25   MR ANGIO HEAD WO CONTRAST  Result Date: 02/08/2020 CLINICAL DATA:  Neuro deficit, acute, stroke suspected. Additional history provided: Fall at home. EXAM: MRI HEAD WITHOUT CONTRAST MRA HEAD WITHOUT CONTRAST TECHNIQUE: Multiplanar, multiecho pulse sequences of the brain and surrounding structures were obtained without intravenous contrast. Angiographic images of the head were obtained using MRA technique without contrast. COMPARISON:  Noncontrast head CT 02/08/2020. Brain MRI 11/06/2019. MRA head 09/26/2009. FINDINGS: MRI HEAD FINDINGS Brain: Cerebral volume is normal for age. There is a thin acute subdural hematoma overlying the anterolateral right frontal lobe measuring up to 4 mm in greatest thickness (for instance as seen on series 11, image 13). In retrospect, this is appreciable on the head CT performed earlier the same day. No significant mass effect upon the underlying right cerebral hemisphere. No midline shift. Moderate multifocal T2/FLAIR hyperintensity within the cerebral white matter and pons is nonspecific, but compatible with chronic small vessel ischemic disease. Unchanged 5 mm rounded extra-axial calcified focus overlying the anterior left frontal lobe, suspected small incidental meningioma. There is no acute infarct. No extra-axial fluid collection. Vascular: Reported below. Skull and upper cervical spine: No focal marrow lesion. Cervical spondylosis. Ligamentous hypertrophy/pannus formation posterior to the dens. Sinuses/Orbits: Prior lens replacements. Trace ethmoid sinus mucosal thickening. Other: Left parietooccipital scalp soft tissue swelling. MRA HEAD FINDINGS The intracranial internal carotid arteries are patent. Moderate/severe atherosclerotic narrowing of the cavernous segments bilaterally, progressed as compared to the MRA of 09/26/2009. No M2 proximal branch occlusion.  Atherosclerotic irregularity of the M2 and more distal MCA branch vessels bilaterally. Most notably, there is a moderate stenosis within a proximal M2 left MCA branch (series 1051, image 4). The anterior cerebral arteries are patent. The intracranial vertebral arteries are patent. The basilar artery is patent. The posterior cerebral arteries are patent. Mild atherosclerotic narrowing of the proximal left PCA (series 1039, image 15). Mild/moderate narrowing of the left PCA at the P3/P4 junction. Posterior communicating arteries are present bilaterally. These results were called by telephone at the time of interpretation on 02/08/2020 at 7:48 am to provider Dr. Roslynn Amble, who verbally acknowledged these results. IMPRESSION: MRI brain: 1. Thin acute subdural hematoma overlying the anterolateral right frontal lobe, measuring up to 4 mm in thickness. 2. Moderate chronic small vessel ischemic disease, stable as compared to the brain MRI of 11/06/2019. 3. 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 4. Left parietooccipital scalp soft tissue swelling. MRA head: 1. No intracranial large vessel occlusion. 2. Intracranial atherosclerotic disease most notably as follows. 3. 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 4. Moderate focal stenosis within a proximal M2 left MCA branch vessel. 5. Mild/moderate stenosis of the left PCA at the P3/P4 junction. Electronically Signed   By: Kellie Simmering DO   On: 02/08/2020 07:49   MR ANGIO NECK WO CONTRAST  Result Date: 02/08/2020 CLINICAL DATA:  Stroke, follow-up. EXAM: MRA NECK WITHOUT CONTRAST TECHNIQUE: Angiographic images of the neck were obtained using MRA technique without intravenous contrast. Carotid stenosis measurements (when applicable) are obtained utilizing NASCET criteria, using the distal internal carotid diameter as the denominator.  COMPARISON:  MRA neck 09/26/2009. FINDINGS: The right CCA and cervical ICA are patent without hemodynamically significant stenosis. Mild atherosclerotic irregularity of the proximal right  ICA, progressed as compared to the MRA of 09/26/2009. The left CCA and cervical ICA are patent. On the source time-of-flight sequence, there is apparent 60% stenosis at the origin of the left ICA, progressed as compared to the prior exam. However, this degree of stenosis is not definitively appreciable on the MIP reconstructions. Limited evaluation of the proximal vertebral arteries due to motion degradation and noncontrast technique. The vertebral arteries are otherwise patent within the neck without hemodynamically significant stenosis. IMPRESSION: 1. Apparent 60% stenosis at the origin of the left ICA on the source time-of-flight sequence, progressed from the MRA of 09/26/2009. However, this degree of stenosis is not definitively appreciated on the MIP reconstructions. Carotid artery duplex may be obtained for further evaluation, as clinically warranted. 2. The right CCA and ICA are patent within the neck without hemodynamically significant stenosis. Progressive mild atherosclerotic irregularity of the proximal right ICA. 3. Motion degradation and non-contrast technique preclude evaluation of the proximal vertebral arteries. The vertebral arteries are otherwise patent within the neck without significant stenosis. Electronically Signed   By: Kellie Simmering DO   On: 02/08/2020 08:04   MR BRAIN WO CONTRAST  Result Date: 02/08/2020 CLINICAL DATA:  Neuro deficit, acute, stroke suspected. Additional history provided: Fall at home. EXAM: MRI HEAD WITHOUT CONTRAST MRA HEAD WITHOUT CONTRAST TECHNIQUE: Multiplanar, multiecho pulse sequences of the brain and surrounding structures were obtained without intravenous contrast. Angiographic images of the head were obtained using MRA technique without contrast. COMPARISON:  Noncontrast head  CT 02/08/2020. Brain MRI 11/06/2019. MRA head 09/26/2009. FINDINGS: MRI HEAD FINDINGS Brain: Cerebral volume is normal for age. There is a thin acute subdural hematoma overlying the anterolateral right frontal lobe measuring up to 4 mm in greatest thickness (for instance as seen on series 11, image 13). In retrospect, this is appreciable on the head CT performed earlier the same day. No significant mass effect upon the underlying right cerebral hemisphere. No midline shift. Moderate multifocal T2/FLAIR hyperintensity within the cerebral white matter and pons is nonspecific, but compatible with chronic small vessel ischemic disease. Unchanged 5 mm rounded extra-axial calcified focus overlying the anterior left frontal lobe, suspected small incidental meningioma. There is no acute infarct. No extra-axial fluid collection. Vascular: Reported below. Skull and upper cervical spine: No focal marrow lesion. Cervical spondylosis. Ligamentous hypertrophy/pannus formation posterior to the dens. Sinuses/Orbits: Prior lens replacements. Trace ethmoid sinus mucosal thickening. Other: Left parietooccipital scalp soft tissue swelling. MRA HEAD FINDINGS The intracranial internal carotid arteries are patent. Moderate/severe atherosclerotic narrowing of the cavernous segments bilaterally, progressed as compared to the MRA of 09/26/2009. No M2 proximal branch occlusion. Atherosclerotic irregularity of the M2 and more distal MCA branch vessels bilaterally. Most notably, there is a moderate stenosis within a proximal M2 left MCA branch (series 1051, image 4). The anterior cerebral arteries are patent. The intracranial vertebral arteries are patent. The basilar artery is patent. The posterior cerebral arteries are patent. Mild atherosclerotic narrowing of the proximal left PCA (series 1039, image 15). Mild/moderate narrowing of the left PCA at the P3/P4 junction. Posterior communicating arteries are present bilaterally. These results  were called by telephone at the time of interpretation on 02/08/2020 at 7:48 am to provider Dr. Roslynn Amble, who verbally acknowledged these results. IMPRESSION: MRI brain: 1. Thin acute subdural hematoma overlying the anterolateral right frontal lobe, measuring up to 4 mm in thickness. 2. Moderate chronic small vessel ischemic disease, stable as compared to the brain MRI of 11/06/2019. 3. Suspected 5 mm incidental meningioma overlying  the anterior left frontal lobe, unchanged. 4. Left parietooccipital scalp soft tissue swelling. MRA head: 1. No intracranial large vessel occlusion. 2. Intracranial atherosclerotic disease most notably as follows. 3. 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 4. Moderate focal stenosis within a proximal M2 left MCA branch vessel. 5. Mild/moderate stenosis of the left PCA at the P3/P4 junction. Electronically Signed   By: Kellie Simmering DO   On: 02/08/2020 07:49   DG Pelvis Portable  Result Date: 02/08/2020 CLINICAL DATA:  Pelvic trauma EXAM: PORTABLE PELVIS 1-2 VIEWS COMPARISON:  None. FINDINGS: Single view radiograph the pelvis demonstrates an acute, left subcapital femoral neck fracture with superior displacement, varus angulation, and external rotation of the distal fracture fragment. The femoral head is still seated within the left acetabulum. Mild bilateral hip joint space narrowing is present in keeping with mild bilateral degenerative arthritis. Pelvis and sacrum appear intact. Limited evaluation of the right hip is unremarkable. IMPRESSION: Acute, displaced, angulated subcapital left femoral neck fracture. Electronically Signed   By: Fidela Salisbury MD   On: 02/08/2020 02:44   DG Chest Port 1 View  Result Date: 02/08/2020 CLINICAL DATA:  Chest trauma EXAM: PORTABLE CHEST 1 VIEW COMPARISON:  10/04/2018 FINDINGS: The heart size and mediastinal contours are within normal limits. Both lungs  are clear. The visualized skeletal structures are unremarkable. IMPRESSION: No active disease. Electronically Signed   By: Fidela Salisbury MD   On: 02/08/2020 02:42    Review of Systems  HENT: Negative for ear discharge, ear pain, hearing loss and tinnitus.   Eyes: Negative for photophobia and pain.  Respiratory: Negative for cough and shortness of breath.   Cardiovascular: Negative for chest pain.  Gastrointestinal: Negative for abdominal pain, nausea and vomiting.  Genitourinary: Negative for dysuria, flank pain, frequency and urgency.  Musculoskeletal: Positive for arthralgias (Left hip). Negative for back pain, myalgias and neck pain.  Neurological: Negative for dizziness and headaches.  Hematological: Does not bruise/bleed easily.  Psychiatric/Behavioral: The patient is not nervous/anxious.    Blood pressure 129/71, pulse 87, resp. rate (!) 24, height 5\' 6"  (1.676 m), weight 85.7 kg, SpO2 100 %. Physical Exam Constitutional:      General: She is not in acute distress.    Appearance: She is well-developed. She is not diaphoretic.  HENT:     Head: Normocephalic and atraumatic.  Eyes:     General: No scleral icterus.       Right eye: No discharge.        Left eye: No discharge.     Conjunctiva/sclera: Conjunctivae normal.  Cardiovascular:     Rate and Rhythm: Normal rate and regular rhythm.  Pulmonary:     Effort: Pulmonary effort is normal. No respiratory distress.  Musculoskeletal:     Cervical back: Normal range of motion.     Comments: LLE No traumatic wounds, ecchymosis, or rash  Severe TTP hip  No knee or ankle effusion  Knee stable to varus/ valgus and anterior/posterior stress  Sens DPN, SPN, TN paresthetic  Motor EHL, ext, flex, evers 5/5  DP 2+, PT 1+, 2+ edema  Skin:    General: Skin is warm and dry.  Neurological:     Mental Status: She is alert.  Psychiatric:        Behavior: Behavior normal.     Assessment/Plan: Left hip fx -- Will need hip hemi once  cleared. Will tentatively plan on tomorrow or Sunday. Multiple  medical problems including CAD status post stenting, chronic kidney disease stage IV follows with [name], diabetes mellitus type 2, chronic diastolic CHF and chronic bilateral extremity edema -- per primary service    Lisette Abu, PA-C Orthopedic Surgery [phone] 02/08/2020, 10:27 AM

## 2020-02-08 NOTE — Progress Notes (Signed)
MRI negative for stroke. Traumatic hematoma. Stroke will sign off.

## 2020-02-08 NOTE — Progress Notes (Signed)
PROGRESS NOTE    [name]  NFA:[id number] DOB: [birth year] DOA: 02/08/2020 PCP: [name], DO    Brief Narrative:  [name] is a 79 year old female with past medical history significant for CAD s/p PCI/stent, CKD stage IV (follows with Nephrology, [name]), type 2 diabetes mellitus, chronic diastolic CHF, chronic bilateral lower extremity edema who presented to the ED following fall at home as she was trying to get out of the shower.  No LOC reported.  Patient with complaint of left hip pain.  In the ED, pelvis x-Ganger notable for acute displaced angulated left femoral neck fracture.  Patient was noted to have a contusion/abrasion to posterior scalp, head CT and C-spine notable for possible subacute infarct right frontal area.  Neurology and orthopedics were consulted.  Creatinine 1.7 with glucose 179.  Hospital service consulted for admission for acute left hip fracture and concern of CVA.   Assessment & Plan:   Principal Problem:   Hip fracture, left, closed, initial encounter [facility]) Active Problems:   CAD (coronary artery disease)   CKD (chronic kidney disease) stage 4, GFR 15-29 ml/min (HCC)   Chronic diastolic CHF (congestive heart failure) (HCC)   Type 2 diabetes mellitus with vascular disease (HCC)   Hip fracture (HCC)   Closed fracture of neck of left femur (HCC)   Left femoral neck fracture, closed Patient presenting from home following a fall in shower with associated left hip pain.  X-Frymire pelvis notable for acute displaced, angulated subcapital left femoral neck fracture. --Orthopedics consulted, will likely need operative management, possible 11/13 --Norco 5-325mg  1-2 tabs q6hr prn moderate pain --morphine 1 mg every 2 hours prn severe pain  Right frontal subdural hematoma Presenting following a fall in the bathtub in which she struck the back of her head.  Initial CT head/C-spine shows no acute intracranial injury, no acute fracture or listhesis but  does note possible subacute versus remote cortical infarct precentral gyrus of right frontal lobe.  MR brain notable for thin acute 4 mm subdural hematoma right frontal lobe, no reports of ischemic CVA.  Neurosurgery was consulted, no surgical issue and no repeat scan should be obtained per [name]; and even questions if hematoma exists.  --Supportive care, continue neurochecks --Neurosurgery signed off 02/08/2020  CAD s/p PCI/stent On Plavix 75 mg p.o. daily, Crestor 20 mg p.o. daily, metoprolol succinate 12.5 mg p.o. nightly.  Follows with cardiology outpatient, [name].  Previous bare-metal stent/PCI LAD 2006. --Continue metoprolol succinate 12.5mg  daiyl --Hold Plavix and Crestor  Chronic diastolic congestive heart failure --Continue metoprolol succinate 12.5 mg p.o. daily --Also on Bumex and metolazone (Mon/Thur) - will hold for now --Strict I's and O's and daily weights  CKD stage IV Follows with nephrology outpatient, [name].  Creatinine 1.70 on admission, baseline 1.6-1.9. On Bumex and metolazone outpatient. --Continue monitor strict I's and O's and daily weights --Hold diuretics for now  Type 2 diabetes mellitus Home regimen includes Januvia 25 mg p.o. daily.  Hemoglobin A1c 7.27 January 2018. --Hold oral hypoglycemics while inpatient --Update hemoglobin A1c --Insulin sliding scale for coverage --CBGs qAC/HS  HLD --Lipid panel --Will hold Crestor for now  Hx Breast Ca Diagnosed in 2018, follows with [name] outpatient; last visit 12/05/2019  Preoperative risk stratification Patient with history of diastolic congestive heart failure with preserved EF of 65 to 70% on prior TTE 2019 and history of ischemic heart disease status post PCI bare-metal stent to LAD 2006.  Patient is a class III  risk with a 30-day mortality of 10.1%.  Given that she was fairly independent prior to fall with subsequent hip fracture, it is reasonable to proceed with operative  management.   DVT prophylaxis: SCDs Code Status: Full Code Family Communication: Updated patient's daughter who is present at bedside  Disposition Plan:  Status is: Inpatient  Remains inpatient appropriate because:Ongoing active pain requiring inpatient pain management, Ongoing diagnostic testing needed not appropriate for outpatient work up, Unsafe d/c plan, IV treatments appropriate due to intensity of illness or inability to take PO and Inpatient level of care appropriate due to severity of illness   Dispo: The patient is from: Home              Anticipated d/c is to: SNF              Anticipated d/c date is: 3 days              Patient currently is not medically stable to d/c.   Consultants:   Orthopedics, Dr. Griffin Basil  Neurosurgery, [name] - signed off 02/08/2020  Neurology -signed off 02/08/2020  Procedures:   None  Antimicrobials:   None   Subjective: Patient seen and examined at bedside, complaining of pain to left hip area.  Daughter present.  MRI notable for cysts possible small 4 mm subdural hematoma right frontal region.  Consulted neurosurgery, no surgical indications and no repeat scans necessary.  No ischemic CVA noted on MRI brain, neurology signing off.  Okay to proceed with surgical intervention per neurology/neurosurgery.  Patient without any other complaints or concerns at this time.  Denies chest pain, no shortness breath, no abdominal pain, no fever/chills/night sweats, no nausea/vomiting/diarrhea.  Objective: Vitals:   02/08/20 0915 02/08/20 0930 02/08/20 1000 02/08/20 1059  BP: (!) 116/57 139/69 (!) 116/57   Pulse: 89 91 91   Resp: (!) 26 (!) 21 (!) 27   Temp:    98.9 F (37.2 C)  TempSrc:    Oral  SpO2: 99% 95% 96%   Weight:      Height:        Intake/Output Summary (Last 24 hours) at 02/08/2020 1202 Last data filed at 02/08/2020 0209 Gross per 24 hour  Intake 200 ml  Output --  Net 200 ml   Filed Weights   02/08/20 0228   Weight: 85.7 kg    Examination:  General exam: Appears calm, uncomfortable secondary to acute left hip pain Respiratory system: Clear to auscultation. Respiratory effort normal.  Oxygenating well on room air Cardiovascular system: S1 & S2 heard, RRR. No JVD, murmurs, rubs, gallops or clicks. No pedal edema. Gastrointestinal system: Abdomen is nondistended, soft and nontender. No organomegaly or masses felt. Normal bowel sounds heard. Central nervous system: Alert and oriented. No focal neurological deficits. Extremities: Moves all extremities independently besides left lower extremity, LLE shortened with external rotation Skin: No rashes, lesions or ulcers Psychiatry: Judgement and insight appear normal. Mood & affect appropriate.     Data Reviewed: I have personally reviewed following labs and imaging studies  CBC: Recent Labs  Lab 02/08/20 0235 02/08/20 0241  WBC 8.1  --   NEUTROABS 6.4  --   HGB 13.3 14.6  HCT 43.4 43.0  MCV 94.1  --   PLT 217  --    Basic Metabolic Panel: Recent Labs  Lab 02/08/20 0235 02/08/20 0241  NA 138 137  K 4.9 4.9  CL 99 99  CO2 29  --   GLUCOSE 179*  173*  BUN 29* 38*  CREATININE 1.77* 1.70*  CALCIUM 10.0  --    GFR: Estimated Creatinine Clearance: 28.6 mL/min (A) (by C-G formula based on SCr of 1.7 mg/dL (H)). Liver Function Tests: Recent Labs  Lab 02/08/20 0235  AST 44*  ALT 29  ALKPHOS 104  BILITOT 0.5  PROT 7.2  ALBUMIN 3.8   No results for input(s): LIPASE, AMYLASE in the last 168 hours. No results for input(s): AMMONIA in the last 168 hours. Coagulation Profile: Recent Labs  Lab 02/08/20 0235  INR 1.1   Cardiac Enzymes: No results for input(s): CKTOTAL, CKMB, CKMBINDEX, TROPONINI in the last 168 hours. BNP (last 3 results) No results for input(s): PROBNP in the last 8760 hours. HbA1C: Recent Labs    02/08/20 0753  HGBA1C 8.8*   CBG: Recent Labs  Lab 02/08/20 0738  GLUCAP 122*   Lipid Profile: No  results for input(s): CHOL, HDL, LDLCALC, TRIG, CHOLHDL, LDLDIRECT in the last 72 hours. Thyroid Function Tests: No results for input(s): TSH, T4TOTAL, FREET4, T3FREE, THYROIDAB in the last 72 hours. Anemia Panel: No results for input(s): VITAMINB12, FOLATE, FERRITIN, TIBC, IRON, RETICCTPCT in the last 72 hours. Sepsis Labs: No results for input(s): PROCALCITON, LATICACIDVEN in the last 168 hours.  Recent Results (from the past 240 hour(s))  Respiratory Panel by RT PCR (Flu A&B, Covid) - Nasopharyngeal Swab     Status: None   Collection Time: 02/08/20  3:42 AM   Specimen: Nasopharyngeal Swab  Result Value Ref Range Status   SARS Coronavirus 2 by RT PCR NEGATIVE NEGATIVE Final    Comment: (NOTE) SARS-CoV-2 target nucleic acids are NOT DETECTED.  The SARS-CoV-2 RNA is generally detectable in upper respiratoy specimens during the acute phase of infection. The lowest concentration of SARS-CoV-2 viral copies this assay can detect is 131 copies/mL. A negative result does not preclude SARS-Cov-2 infection and should not be used as the sole basis for treatment or other patient management decisions. A negative result may occur with  improper specimen collection/handling, submission of specimen other than nasopharyngeal swab, presence of viral mutation(s) within the areas targeted by this assay, and inadequate number of viral copies (<131 copies/mL). A negative result must be combined with clinical observations, patient history, and epidemiological information. The expected result is Negative.  Fact Sheet for Patients:  PinkCheek.be  Fact Sheet for Healthcare Providers:  GravelBags.it  This test is no t yet approved or cleared by the [place] FDA and  has been authorized for detection and/or diagnosis of SARS-CoV-2 by FDA under an Emergency Use Authorization (EUA). This EUA will remain  in effect (meaning this test can be  used) for the duration of the COVID-19 declaration under Section 564(b)(1) of the Act, 21 U.S.C. section 360bbb-3(b)(1), unless the authorization is terminated or revoked sooner.     Influenza A by PCR NEGATIVE NEGATIVE Final   Influenza B by PCR NEGATIVE NEGATIVE Final    Comment: (NOTE) The Xpert Xpress SARS-CoV-2/FLU/RSV assay is intended as an aid in  the diagnosis of influenza from Nasopharyngeal swab specimens and  should not be used as a sole basis for treatment. Nasal washings and  aspirates are unacceptable for Xpert Xpress SARS-CoV-2/FLU/RSV  testing.  Fact Sheet for Patients: PinkCheek.be  Fact Sheet for Healthcare Providers: GravelBags.it  This test is not yet approved or cleared by the [place] FDA and  has been authorized for detection and/or diagnosis of SARS-CoV-2 by  FDA under an Emergency Use Authorization (EUA). This  EUA will remain  in effect (meaning this test can be used) for the duration of the  Covid-19 declaration under Section 564(b)(1) of the Act, 21  U.S.C. section 360bbb-3(b)(1), unless the authorization is  terminated or revoked. Performed at [facility], [place] [address] [place], [place] [address]          Radiology Studies: CT Head Wo Contrast  Result Date: 02/08/2020 CLINICAL DATA:  Head trauma EXAM: CT HEAD WITHOUT CONTRAST CT CERVICAL SPINE WITHOUT CONTRAST TECHNIQUE: Multidetector CT imaging of the head and cervical spine was performed following the standard protocol without intravenous contrast. Multiplanar CT image reconstructions of the cervical spine were also generated. COMPARISON:  CT head 11/26/2019 FINDINGS: CT HEAD FINDINGS Brain: Normal anatomic configuration of the brain. There is interval development of a small cortical infarct involving the right frontal lobe involving the precentral gyrus, best seen on axial image # 22/3. No significant associated mass effect.  No midline shift. Mild periventricular white matter changes are again identified in keeping with probable small vessel ischemia. No abnormal intra or extra-axial mass lesion. No evidence of acute intracranial hemorrhage. Ventricular size is normal. Cerebellum is unremarkable. Vascular: No asymmetric hyperdense vasculature at the skull base. Skull: Intact Sinuses/Orbits: Visualized paranasal sinuses are clear. Orbits are unremarkable. Other: Mastoid air cells and middle ear cavities are clear. CT CERVICAL SPINE FINDINGS Alignment: 3 mm anterolisthesis C4 upon C5 and C7 upon T1 is likely degenerative in nature. Otherwise normal cervical lordosis. Skull base and vertebrae: The craniocervical junction is unremarkable. The atlantodental interval is normal. No acute fracture of the cervical spine. No lytic or blastic bone lesion Soft tissues and spinal canal: No prevertebral fluid or swelling. No visible canal hematoma. Disc levels: Review of the sagittal images demonstrates preservation of vertebral body height. 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 Multiple erosions are seen at the base of the odontoid process likely related to synovial overgrowth. 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 The spinal canal appears widely patent. The prevertebral soft tissues are not thickened. Review of the axial images demonstrates multilevel uncovertebral and facet arthrosis resulting in multilevel neural foraminal narrowing, most severe on the left at C3-4 and on the right at C4-5 as well as mild central canal stenosis at C6-7. Upper chest: Unremarkable Other: Advanced atherosclerotic calcification is seen within the left carotid bifurcation. The degree of stenosis is not well assessed on this noncontrast examination. IMPRESSION: No acute intracranial injury.   No calvarial fracture. 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 No acute fracture or listhesis of the cervical spine. Electronically Signed   By: Fidela Salisbury MD   On: 02/08/2020 03:25   CT Cervical Spine Wo Contrast  Result Date: 02/08/2020 CLINICAL DATA:  Head trauma EXAM: CT HEAD WITHOUT CONTRAST CT CERVICAL SPINE WITHOUT CONTRAST TECHNIQUE: Multidetector CT imaging of the head and cervical spine was performed following the standard protocol without intravenous contrast. Multiplanar CT image reconstructions of the cervical spine were also generated. COMPARISON:  CT head 11/26/2019 FINDINGS: CT HEAD FINDINGS Brain: Normal anatomic configuration of the brain. There is interval development of a small cortical infarct involving the right frontal lobe involving the precentral gyrus, best seen on axial image # 22/3. No significant associated mass effect. No midline shift. Mild periventricular white matter changes are  again identified in keeping with probable small vessel ischemia. No abnormal intra or extra-axial mass lesion. No evidence of acute intracranial hemorrhage. Ventricular size is normal. Cerebellum is unremarkable. Vascular: No asymmetric hyperdense vasculature at the skull base. Skull: Intact Sinuses/Orbits: Visualized paranasal sinuses are clear. Orbits are unremarkable. Other: Mastoid air cells and middle ear cavities are clear. CT CERVICAL SPINE FINDINGS Alignment: 3 mm anterolisthesis C4 upon C5 and C7 upon T1 is likely degenerative in nature. Otherwise normal cervical lordosis. Skull base and vertebrae: The craniocervical junction is unremarkable. The atlantodental interval is normal. No acute fracture of the cervical spine. No lytic or blastic bone lesion Soft tissues and spinal canal: No prevertebral fluid or swelling. No visible canal hematoma. Disc levels: Review of the sagittal images demonstrates preservation  of vertebral body height. 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 Multiple erosions are seen at the base of the odontoid process likely related to synovial overgrowth. 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 The spinal canal appears widely patent. The prevertebral soft tissues are not thickened. Review of the axial images demonstrates multilevel uncovertebral and facet arthrosis resulting in multilevel neural foraminal narrowing, most severe on the left at C3-4 and on the right at C4-5 as well as mild central canal stenosis at C6-7. Upper chest: Unremarkable Other: Advanced atherosclerotic calcification is seen within the left carotid bifurcation. The degree of stenosis is not well assessed on this noncontrast examination. IMPRESSION: No acute intracranial injury.  No calvarial fracture. 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 No acute fracture or listhesis of the cervical spine. Electronically Signed   By: Fidela Salisbury MD   On: 02/08/2020 03:25   MR ANGIO HEAD WO CONTRAST  Result Date: 02/08/2020 CLINICAL DATA:  Neuro deficit, acute, stroke suspected. Additional history provided: Fall at home. EXAM: MRI HEAD WITHOUT CONTRAST MRA HEAD WITHOUT CONTRAST TECHNIQUE: Multiplanar, multiecho pulse sequences of the brain and surrounding structures were obtained without intravenous contrast. Angiographic images of the head were obtained using MRA technique without contrast. COMPARISON:  Noncontrast head CT 02/08/2020. Brain MRI 11/06/2019. MRA head 09/26/2009. FINDINGS: MRI HEAD FINDINGS Brain: Cerebral volume is normal for age. There is a thin acute subdural hematoma overlying the anterolateral right frontal lobe measuring up to 4 mm in greatest thickness (for  instance as seen on series 11, image 13). In retrospect, this is appreciable on the head CT performed earlier the same day. No significant mass effect upon the underlying right cerebral hemisphere. No midline shift. Moderate multifocal T2/FLAIR hyperintensity within the cerebral white matter and pons is nonspecific, but compatible with chronic small vessel ischemic disease. Unchanged 5 mm rounded extra-axial calcified focus overlying the anterior left frontal lobe, suspected small incidental meningioma. There is no acute infarct. No extra-axial fluid collection. Vascular: Reported below. Skull and upper cervical spine: No focal marrow lesion. Cervical spondylosis. Ligamentous hypertrophy/pannus formation posterior to the dens. Sinuses/Orbits: Prior lens replacements. Trace ethmoid sinus mucosal thickening. Other: Left parietooccipital scalp soft tissue swelling. MRA HEAD FINDINGS The intracranial internal carotid arteries are patent. Moderate/severe atherosclerotic narrowing of the cavernous segments bilaterally, progressed as compared to the MRA of 09/26/2009. No M2 proximal branch occlusion. Atherosclerotic irregularity of the M2 and more distal MCA branch vessels bilaterally. Most notably, there is a moderate stenosis within a proximal M2 left MCA  branch (series 1051, image 4). The anterior cerebral arteries are patent. The intracranial vertebral arteries are patent. The basilar artery is patent. The posterior cerebral arteries are patent. Mild atherosclerotic narrowing of the proximal left PCA (series 1039, image 15). Mild/moderate narrowing of the left PCA at the P3/P4 junction. Posterior communicating arteries are present bilaterally. These results were called by telephone at the time of interpretation on 02/08/2020 at 7:48 am to provider Dr. Roslynn Amble, who verbally acknowledged these results. IMPRESSION: MRI brain: 1. Thin acute subdural hematoma overlying the anterolateral right frontal lobe, measuring up to 4  mm in thickness. 2. Moderate chronic small vessel ischemic disease, stable as compared to the brain MRI of 11/06/2019. 3. 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 4. Left parietooccipital scalp soft tissue swelling. MRA head: 1. No intracranial large vessel occlusion. 2. Intracranial atherosclerotic disease most notably as follows. 3. 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 4. Moderate focal stenosis within a proximal M2 left MCA branch vessel. 5. Mild/moderate stenosis of the left PCA at the P3/P4 junction. Electronically Signed   By: Kellie Simmering DO   On: 02/08/2020 07:49   MR ANGIO NECK WO CONTRAST  Result Date: 02/08/2020 CLINICAL DATA:  Stroke, follow-up. EXAM: MRA NECK WITHOUT CONTRAST TECHNIQUE: Angiographic images of the neck were obtained using MRA technique without intravenous contrast. Carotid stenosis measurements (when applicable) are obtained utilizing NASCET criteria, using the distal internal carotid diameter as the denominator. COMPARISON:  MRA neck 09/26/2009. FINDINGS: The right CCA and cervical ICA are patent without hemodynamically significant stenosis. Mild atherosclerotic irregularity of the proximal right ICA, progressed as compared to the MRA of 09/26/2009. The left CCA and cervical ICA are patent. On the source time-of-flight sequence, there is apparent 60% stenosis at the origin of the left ICA, progressed as compared to the prior exam. However, this degree of stenosis is not definitively appreciable on the MIP reconstructions. Limited evaluation of the proximal vertebral arteries due to motion degradation and noncontrast technique. The vertebral arteries are otherwise patent within the neck without hemodynamically significant stenosis. IMPRESSION: 1. Apparent 60% stenosis at the origin of the left ICA on the source time-of-flight sequence,  progressed from the MRA of 09/26/2009. However, this degree of stenosis is not definitively appreciated on the MIP reconstructions. Carotid artery duplex may be obtained for further evaluation, as clinically warranted. 2. The right CCA and ICA are patent within the neck without hemodynamically significant stenosis. Progressive mild atherosclerotic irregularity of the proximal right ICA. 3. Motion degradation and non-contrast technique preclude evaluation of the proximal vertebral arteries. The vertebral arteries are otherwise patent within the neck without significant stenosis. Electronically Signed   By: Kellie Simmering DO   On: 02/08/2020 08:04   MR BRAIN WO CONTRAST  Result Date: 02/08/2020 CLINICAL DATA:  Neuro deficit, acute, stroke suspected. Additional history provided: Fall at home. EXAM: MRI HEAD WITHOUT CONTRAST MRA HEAD WITHOUT CONTRAST TECHNIQUE: Multiplanar, multiecho pulse sequences of the brain and surrounding structures were obtained without intravenous contrast. Angiographic images of the head were obtained using MRA technique without contrast. COMPARISON:  Noncontrast head CT 02/08/2020. Brain MRI 11/06/2019. MRA head 09/26/2009. FINDINGS: MRI HEAD FINDINGS Brain: Cerebral volume is normal for age. There is a thin acute subdural hematoma overlying the anterolateral right frontal lobe measuring up to 4 mm in greatest thickness (for instance as seen on series 11, image 13). In retrospect, this is  appreciable on the head CT performed earlier the same day. No significant mass effect upon the underlying right cerebral hemisphere. No midline shift. Moderate multifocal T2/FLAIR hyperintensity within the cerebral white matter and pons is nonspecific, but compatible with chronic small vessel ischemic disease. Unchanged 5 mm rounded extra-axial calcified focus overlying the anterior left frontal lobe, suspected small incidental meningioma. There is no acute infarct. No extra-axial fluid collection.  Vascular: Reported below. Skull and upper cervical spine: No focal marrow lesion. Cervical spondylosis. Ligamentous hypertrophy/pannus formation posterior to the dens. Sinuses/Orbits: Prior lens replacements. Trace ethmoid sinus mucosal thickening. Other: Left parietooccipital scalp soft tissue swelling. MRA HEAD FINDINGS The intracranial internal carotid arteries are patent. Moderate/severe atherosclerotic narrowing of the cavernous segments bilaterally, progressed as compared to the MRA of 09/26/2009. No M2 proximal branch occlusion. Atherosclerotic irregularity of the M2 and more distal MCA branch vessels bilaterally. Most notably, there is a moderate stenosis within a proximal M2 left MCA branch (series 1051, image 4). The anterior cerebral arteries are patent. The intracranial vertebral arteries are patent. The basilar artery is patent. The posterior cerebral arteries are patent. Mild atherosclerotic narrowing of the proximal left PCA (series 1039, image 15). Mild/moderate narrowing of the left PCA at the P3/P4 junction. Posterior communicating arteries are present bilaterally. These results were called by telephone at the time of interpretation on 02/08/2020 at 7:48 am to provider Dr. Roslynn Amble, who verbally acknowledged these results. IMPRESSION: MRI brain: 1. Thin acute subdural hematoma overlying the anterolateral right frontal lobe, measuring up to 4 mm in thickness. 2. Moderate chronic small vessel ischemic disease, stable as compared to the brain MRI of 11/06/2019. 3. 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 4. Left parietooccipital scalp soft tissue swelling. MRA head: 1. No intracranial large vessel occlusion. 2. Intracranial atherosclerotic disease most notably as follows. 3. 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 4. Moderate focal stenosis within a proximal M2 left  MCA branch vessel. 5. Mild/moderate stenosis of the left PCA at the P3/P4 junction. Electronically Signed   By: Kellie Simmering DO   On: 02/08/2020 07:49   DG Pelvis Portable  Result Date: 02/08/2020 CLINICAL DATA:  Pelvic trauma EXAM: PORTABLE PELVIS 1-2 VIEWS COMPARISON:  None. FINDINGS: Single view radiograph the pelvis demonstrates an acute, left subcapital femoral neck fracture with superior displacement, varus angulation, and external rotation of the distal fracture fragment. The femoral head is still seated within the left acetabulum. Mild bilateral hip joint space narrowing is present in keeping with mild bilateral degenerative arthritis. Pelvis and sacrum appear intact. Limited evaluation of the right hip is unremarkable. IMPRESSION: Acute, displaced, angulated subcapital left femoral neck fracture. Electronically Signed   By: Fidela Salisbury MD   On: 02/08/2020 02:44   DG Chest Port 1 View  Result Date: 02/08/2020 CLINICAL DATA:  Chest trauma EXAM: PORTABLE CHEST 1 VIEW COMPARISON:  10/04/2018 FINDINGS: The heart size and mediastinal contours are within normal limits. Both lungs are clear. The visualized skeletal structures are unremarkable. IMPRESSION: No active disease. Electronically Signed   By: Fidela Salisbury MD   On: 02/08/2020 02:42        Scheduled Meds: . insulin aspart  0-9 Units Subcutaneous Q4H   Continuous Infusions:   LOS: 0 days    Time spent: 46 minutes spent on chart review, discussion with nursing staff, consultants, updating family and interview/physical exam; more than 50% of that time  was spent in counseling and/or coordination of care.    Milissa Fesperman J [place] DO [facility] Available via Epic secure chat 7am-7pm After these hours, please refer to coverage provider listed on [email or web address] 02/08/2020, 12:02 PM

## 2020-02-09 DIAGNOSIS — E1159 Type 2 diabetes mellitus with other circulatory complications: Secondary | ICD-10-CM | POA: Diagnosis not present

## 2020-02-09 LAB — MAGNESIUM: Magnesium: 2.2 mg/dL (ref 1.7–2.4)

## 2020-02-09 LAB — BASIC METABOLIC PANEL
Anion gap: 7 (ref 5–15)
BUN: 23 mg/dL (ref 8–23)
CO2: 30 mmol/L (ref 22–32)
Calcium: 9 mg/dL (ref 8.9–10.3)
Chloride: 101 mmol/L (ref 98–111)
Creatinine, Ser: 1.69 mg/dL — ABNORMAL HIGH (ref 0.44–1.00)
GFR, Estimated: 30 mL/min — ABNORMAL LOW (ref >60)
Glucose, Bld: 130 mg/dL — ABNORMAL HIGH (ref 70–99)
Potassium: 4.7 mmol/L (ref 3.5–5.1)
Sodium: 138 mmol/L (ref 135–145)

## 2020-02-09 LAB — GLUCOSE, CAPILLARY
Glucose-Capillary: 100 mg/dL — ABNORMAL HIGH (ref 70–99)
Glucose-Capillary: 112 mg/dL — ABNORMAL HIGH (ref 70–99)
Glucose-Capillary: 113 mg/dL — ABNORMAL HIGH (ref 70–99)
Glucose-Capillary: 129 mg/dL — ABNORMAL HIGH (ref 70–99)
Glucose-Capillary: 138 mg/dL — ABNORMAL HIGH (ref 70–99)
Glucose-Capillary: 141 mg/dL — ABNORMAL HIGH (ref 70–99)
Glucose-Capillary: 150 mg/dL — ABNORMAL HIGH (ref 70–99)
Glucose-Capillary: 95 mg/dL (ref 70–99)

## 2020-02-09 LAB — CBC
HCT: 37.2 % (ref 36.0–46.0)
Hemoglobin: 11.4 g/dL — ABNORMAL LOW (ref 12.0–15.0)
MCH: 28.9 pg (ref 26.0–34.0)
MCHC: 30.6 g/dL (ref 30.0–36.0)
MCV: 94.2 fL (ref 80.0–100.0)
Platelets: 172 10*3/uL (ref 150–400)
RBC: 3.95 MIL/uL (ref 3.87–5.11)
RDW: 14.6 % (ref 11.5–15.5)
WBC: 9.1 10*3/uL (ref 4.0–10.5)
nRBC: 0 % (ref 0.0–0.2)

## 2020-02-09 MED ORDER — ACETAMINOPHEN 325 MG PO TABS
650.0000 mg | ORAL_TABLET | Freq: Four times a day (QID) | ORAL | Status: DC | PRN
  Administered 2020-02-09 – 2020-02-10 (×3): 650 mg via ORAL
  Filled 2020-02-09 (×3): qty 2

## 2020-02-09 MED ORDER — ADULT MULTIVITAMIN W/MINERALS CH
1.0000 | ORAL_TABLET | Freq: Every day | ORAL | Status: DC
  Administered 2020-02-11 – 2020-02-15 (×5): 1 via ORAL
  Filled 2020-02-09 (×6): qty 1

## 2020-02-09 MED ORDER — CHLORHEXIDINE GLUCONATE CLOTH 2 % EX PADS
6.0000 | MEDICATED_PAD | Freq: Every day | CUTANEOUS | Status: DC
  Administered 2020-02-09 – 2020-02-15 (×5): 6 via TOPICAL

## 2020-02-09 MED ORDER — GLUCERNA SHAKE PO LIQD
237.0000 mL | Freq: Three times a day (TID) | ORAL | Status: DC
  Administered 2020-02-11 – 2020-02-15 (×9): 237 mL via ORAL

## 2020-02-09 NOTE — Plan of Care (Signed)

## 2020-02-09 NOTE — Progress Notes (Signed)
Initial Nutrition Assessment  DOCUMENTATION CODES:   Not applicable  INTERVENTION:  Glucerna Shake po TID, each supplement provides 220 kcal and 10 grams of protein  MVI daily   NUTRITION DIAGNOSIS:   Increased nutrient needs related to hip fracture as evidenced by estimated needs.    GOAL:   Patient will meet greater than or equal to 90% of their needs    MONITOR:   PO intake, Supplement acceptance, Labs, Weight trends, I & O's  REASON FOR ASSESSMENT:   Consult Hip fracture protocol  ASSESSMENT:   Pt admitted with L hip fracture s/p fall. PMH significant for CAD s/p stenting, CKD stage IV, type 2 DM, CHF, and chronic bilateral lower extremity edema.   Discussed pt with RN. Pt possibly going to OR today.  Pt's daughter provided history. At baseline, pt eats at least 3 balanced meals per day. Denies any changes to appetite. Also denies any significant weight changes. Will place order for oral nutrition supplement to provide pt with additional kcals/protein post surgery.  No PO intake documented.  UOP: 1324ml x24 hours  Labs: CBGs [id number] Medications: ss novolog    NUTRITION - FOCUSED PHYSICAL EXAM:  Unable to perform at this time; will attempt at follow-up.   Diet Order:   Diet Order            Diet Carb Modified Fluid consistency: Thin; Room service appropriate? Yes  Diet effective now                 EDUCATION NEEDS:   No education needs have been identified at this time  Skin:  Skin Assessment: Reviewed RN Assessment  Last BM:  11/12  Height:   Ht Readings from Last 1 Encounters:  02/08/20 5\' 6"  (1.676 m)    Weight:   Wt Readings from Last 1 Encounters:  02/08/20 85.7 kg    Ideal Body Weight:  59.09 kg  BMI:  Body mass index is 30.51 kg/m.  Estimated Nutritional Needs:   Kcal:  1900-2100  Protein:  95-105 grams  Fluid:  >/= 1.9L/d    Larkin Ina, MS, RD, LDN RD pager number and weekend/on-call pager number  located in [place].

## 2020-02-09 NOTE — Plan of Care (Signed)

## 2020-02-09 NOTE — Progress Notes (Signed)
PROGRESS NOTE    [name]  WJX:[id number] DOB: 03-Jun-1940 DOA: 02/08/2020 PCP: [name], DO    Brief Narrative:  [name] is a 79 year old female with past medical history significant for CAD s/p PCI/stent, CKD stage IV (follows with Nephrology, [name]), type 2 diabetes mellitus, chronic diastolic CHF, chronic bilateral lower extremity edema who presented to the ED following fall at home as she was trying to get out of the shower.  No LOC reported.  Patient with complaint of left hip pain.  In the ED, pelvis x-Grogan notable for acute displaced angulated left femoral neck fracture.  Patient was noted to have a contusion/abrasion to posterior scalp, head CT and C-spine notable for possible subacute infarct right frontal area.  Neurology and orthopedics were consulted.  Creatinine 1.7 with glucose 179.  Hospital service consulted for admission for acute left hip fracture and concern of CVA.   Assessment & Plan:   Principal Problem:   Hip fracture, left, closed, initial encounter [facility]) Active Problems:   CAD (coronary artery disease)   CKD (chronic kidney disease) stage 4, GFR 15-29 ml/min (HCC)   Chronic diastolic CHF (congestive heart failure) (HCC)   Type 2 diabetes mellitus with vascular disease (HCC)   Hip fracture (HCC)   Closed fracture of neck of left femur (HCC)   Left femoral neck fracture, closed Patient presenting from home following a fall in shower with associated left hip pain.  X-[place] pelvis notable for acute displaced, angulated subcapital left femoral neck fracture. --Orthopedics consulted, pending operative management --Norco 5-325mg  1-2 tabs q6hr prn moderate pain --morphine 1 mg every 2 hours prn severe pain --N.p.o. after midnight --Awaiting further orthopedics recommendations  Right frontal subdural hematoma Presenting following a fall in the bathtub in which she struck the back of her head.  Initial CT head/C-spine shows no acute  intracranial injury, no acute fracture or listhesis but does note possible subacute versus remote cortical infarct precentral gyrus of right frontal lobe.  MR brain notable for thin acute 4 mm subdural hematoma right frontal lobe, no reports of ischemic CVA.  Neurosurgery was consulted, no surgical issue and no repeat scan should be obtained per [name]; and even questions if hematoma exists.  --Supportive care, continue neurochecks --Neurosurgery signed off 02/08/2020  CAD s/p PCI/stent On Plavix 75 mg p.o. daily, Crestor 20 mg p.o. daily, metoprolol succinate 12.5 mg p.o. nightly.  Follows with cardiology outpatient, [name].  Previous bare-metal stent/PCI LAD 2006. --Continue metoprolol succinate 12.5mg  daily --Hold Plavix and Crestor  Chronic diastolic congestive heart failure --Continue metoprolol succinate 12.5 mg p.o. daily --Also on Bumex and metolazone (Mon/Thur) - will hold for now --Strict I's and O's and daily weights  CKD stage IV Follows with nephrology outpatient, [name].  Creatinine 1.70 on admission, baseline 1.6-1.9. On Bumex and metolazone outpatient. --Cr 1.70>1.69 --Continue monitor strict I's and O's and daily weights --Hold diuretics for now  Type 2 diabetes mellitus Home regimen includes Januvia 25 mg p.o. daily.  Hemoglobin A1c 8.8, poorly controlled --Hold oral hypoglycemics while inpatient --Insulin sliding scale for coverage --CBGs qAC/HS  HLD --hold Crestor for now  Hx Breast Ca Diagnosed in 2018, follows with [name] outpatient; last visit 12/05/2019  Preoperative risk stratification Patient with history of diastolic congestive heart failure with preserved EF of 65 to 70% on prior TTE 2019 and history of ischemic heart disease status post PCI bare-metal stent to LAD 2006.  Patient is a class III risk  with a 30-day mortality of 10.1%.  Given that she was fairly independent prior to fall with subsequent hip fracture, it is reasonable to proceed  with operative management.   DVT prophylaxis: SCDs Code Status: Full Code Family Communication: Updated patient's daughter who is present at bedside  Disposition Plan:  Status is: Inpatient  Remains inpatient appropriate because:Ongoing active pain requiring inpatient pain management, Ongoing diagnostic testing needed not appropriate for outpatient work up, Unsafe d/c plan, IV treatments appropriate due to intensity of illness or inability to take PO and Inpatient level of care appropriate due to severity of illness   Dispo: The patient is from: Home              Anticipated d/c is to: SNF              Anticipated d/c date is: 3 days              Patient currently is not medically stable to d/c.   Consultants:   Orthopedics, Dr. Griffin Basil  Neurosurgery, [name] - signed off 02/08/2020  Neurology - signed off 02/08/2020  Procedures:   None  Antimicrobials:   None   Subjective: Patient seen and examined at bedside, daughter present.  Continues with hip pain.  Awaiting orthopedic intervention.  No other complaints or concerns at this time.  Denies chest pain, no shortness breath, no abdominal pain, no fever/chills/night sweats, no nausea/vomiting/diarrhea.  Objective: Vitals:   02/09/20 0946 02/09/20 1445 02/09/20 1450 02/09/20 1506  BP: 111/63     Pulse: 80     Resp:      Temp:  (!) 101.7 F (38.7 C) 99.6 F (37.6 C) (!) 101 F (38.3 C)  TempSrc:  Oral Oral Oral  SpO2:      Weight:      Height:        Intake/Output Summary (Last 24 hours) at 02/09/2020 1525 Last data filed at 02/09/2020 0900 Gross per 24 hour  Intake 0 ml  Output 1350 ml  Net -1350 ml   Filed Weights   02/08/20 0228  Weight: 85.7 kg    Examination:  General exam: Appears calm, uncomfortable secondary to acute left hip pain HEENT: Noted posterior scalp abrasion/contusion Respiratory system: Clear to auscultation. Respiratory effort normal.  Oxygenating well on room  air Cardiovascular system: S1 & S2 heard, RRR. No JVD, murmurs, rubs, gallops or clicks. No pedal edema. Gastrointestinal system: Abdomen is nondistended, soft and nontender. No organomegaly or masses felt. Normal bowel sounds heard. Central nervous system: Alert and oriented. No focal neurological deficits. Extremities: Moves all extremities independently besides left lower extremity, LLE shortened with external rotation Skin: No rashes, lesions or ulcers Psychiatry: Judgement and insight appear normal. Mood & affect appropriate.     Data Reviewed: I have personally reviewed following labs and imaging studies  CBC: Recent Labs  Lab 02/08/20 0235 02/08/20 0241 02/09/20 0403  WBC 8.1  --  9.1  NEUTROABS 6.4  --   --   HGB 13.3 14.6 11.4*  HCT 43.4 43.0 37.2  MCV 94.1  --  94.2  PLT 217  --  536   Basic Metabolic Panel: Recent Labs  Lab 02/08/20 0235 02/08/20 0241 02/09/20 0403  NA 138 137 138  K 4.9 4.9 4.7  CL 99 99 101  CO2 29  --  30  GLUCOSE 179* 173* 130*  BUN 29* 38* 23  CREATININE 1.77* 1.70* 1.69*  CALCIUM 10.0  --  9.0  MG  --   --  2.2   GFR: Estimated Creatinine Clearance: 28.8 mL/min (A) (by C-G formula based on SCr of 1.69 mg/dL (H)). Liver Function Tests: Recent Labs  Lab 02/08/20 0235  AST 44*  ALT 29  ALKPHOS 104  BILITOT 0.5  PROT 7.2  ALBUMIN 3.8   No results for input(s): LIPASE, AMYLASE in the last 168 hours. No results for input(s): AMMONIA in the last 168 hours. Coagulation Profile: Recent Labs  Lab 02/08/20 0235  INR 1.1   Cardiac Enzymes: No results for input(s): CKTOTAL, CKMB, CKMBINDEX, TROPONINI in the last 168 hours. BNP (last 3 results) No results for input(s): PROBNP in the last 8760 hours. HbA1C: Recent Labs    02/08/20 0753  HGBA1C 8.8*   CBG: Recent Labs  Lab 02/09/20 0308 02/09/20 0421 02/09/20 0751 02/09/20 0947 02/09/20 1209  GLUCAP 138* 113* 95 112* 141*   Lipid Profile: Recent Labs     02/08/20 0754  CHOL 148  HDL 36*  LDLCALC 73  TRIG 193*  CHOLHDL 4.1   Thyroid Function Tests: No results for input(s): TSH, T4TOTAL, FREET4, T3FREE, THYROIDAB in the last 72 hours. Anemia Panel: No results for input(s): VITAMINB12, FOLATE, FERRITIN, TIBC, IRON, RETICCTPCT in the last 72 hours. Sepsis Labs: No results for input(s): PROCALCITON, LATICACIDVEN in the last 168 hours.  Recent Results (from the past 240 hour(s))  Respiratory Panel by RT PCR (Flu A&B, Covid) - Nasopharyngeal Swab     Status: None   Collection Time: 02/08/20  3:42 AM   Specimen: Nasopharyngeal Swab  Result Value Ref Range Status   SARS Coronavirus 2 by RT PCR NEGATIVE NEGATIVE Final    Comment: (NOTE) SARS-CoV-2 target nucleic acids are NOT DETECTED.  The SARS-CoV-2 RNA is generally detectable in upper respiratoy specimens during the acute phase of infection. The lowest concentration of SARS-CoV-2 viral copies this assay can detect is 131 copies/mL. A negative result does not preclude SARS-Cov-2 infection and should not be used as the sole basis for treatment or other patient management decisions. A negative result may occur with  improper specimen collection/handling, submission of specimen other than nasopharyngeal swab, presence of viral mutation(s) within the areas targeted by this assay, and inadequate number of viral copies (<131 copies/mL). A negative result must be combined with clinical observations, patient history, and epidemiological information. The expected result is Negative.  Fact Sheet for Patients:  PinkCheek.be  Fact Sheet for Healthcare Providers:  GravelBags.it  This test is no t yet approved or cleared by the [place] FDA and  has been authorized for detection and/or diagnosis of SARS-CoV-2 by FDA under an Emergency Use Authorization (EUA). This EUA will remain  in effect (meaning this test can be used) for the  duration of the COVID-19 declaration under Section 564(b)(1) of the Act, 21 U.S.C. section 360bbb-3(b)(1), unless the authorization is terminated or revoked sooner.     Influenza A by PCR NEGATIVE NEGATIVE Final   Influenza B by PCR NEGATIVE NEGATIVE Final    Comment: (NOTE) The Xpert Xpress SARS-CoV-2/FLU/RSV assay is intended as an aid in  the diagnosis of influenza from Nasopharyngeal swab specimens and  should not be used as a sole basis for treatment. Nasal washings and  aspirates are unacceptable for Xpert Xpress SARS-CoV-2/FLU/RSV  testing.  Fact Sheet for Patients: PinkCheek.be  Fact Sheet for Healthcare Providers: GravelBags.it  This test is not yet approved or cleared by the [place] FDA and  has been authorized for detection and/or diagnosis of SARS-CoV-2 by  FDA  under an Emergency Use Authorization (EUA). This EUA will remain  in effect (meaning this test can be used) for the duration of the  Covid-19 declaration under Section 564(b)(1) of the Act, 21  U.S.C. section 360bbb-3(b)(1), unless the authorization is  terminated or revoked. Performed at [facility], [place] [address] [place], [place] [address]   Surgical pcr screen     Status: None   Collection Time: 02/08/20  5:42 PM   Specimen: Nasal Mucosa; Nasal Swab  Result Value Ref Range Status   MRSA, PCR NEGATIVE NEGATIVE Final   Staphylococcus aureus NEGATIVE NEGATIVE Final    Comment: (NOTE) The Xpert SA Assay (FDA approved for NASAL specimens in patients 26 years of age and older), is one component of a comprehensive surveillance program. It is not intended to diagnose infection nor to guide or monitor treatment. Performed at [facility], [place] [address] [place], [place] [address]          Radiology Studies: CT Head Wo Contrast  Result Date: 02/08/2020 CLINICAL DATA:  Head trauma EXAM: CT HEAD WITHOUT CONTRAST CT CERVICAL  SPINE WITHOUT CONTRAST TECHNIQUE: Multidetector CT imaging of the head and cervical spine was performed following the standard protocol without intravenous contrast. Multiplanar CT image reconstructions of the cervical spine were also generated. COMPARISON:  CT head 11/26/2019 FINDINGS: CT HEAD FINDINGS Brain: Normal anatomic configuration of the brain. There is interval development of a small cortical infarct involving the right frontal lobe involving the precentral gyrus, best seen on axial image # 22/3. No significant associated mass effect. No midline shift. Mild periventricular white matter changes are again identified in keeping with probable small vessel ischemia. No abnormal intra or extra-axial mass lesion. No evidence of acute intracranial hemorrhage. Ventricular size is normal. Cerebellum is unremarkable. Vascular: No asymmetric hyperdense vasculature at the skull base. Skull: Intact Sinuses/Orbits: Visualized paranasal sinuses are clear. Orbits are unremarkable. Other: Mastoid air cells and middle ear cavities are clear. CT CERVICAL SPINE FINDINGS Alignment: 3 mm anterolisthesis C4 upon C5 and C7 upon T1 is likely degenerative in nature. Otherwise normal cervical lordosis. Skull base and vertebrae: The craniocervical junction is unremarkable. The atlantodental interval is normal. No acute fracture of the cervical spine. No lytic or blastic bone lesion Soft tissues and spinal canal: No prevertebral fluid or swelling. No visible canal hematoma. Disc levels: Review of the sagittal images demonstrates preservation of vertebral body height. 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 Multiple erosions are seen at the base of the odontoid process likely related to synovial overgrowth. 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 The spinal  canal appears widely patent. The prevertebral soft tissues are not thickened. Review of the axial images demonstrates multilevel uncovertebral and facet arthrosis resulting in multilevel neural foraminal narrowing, most severe on the left at C3-4 and on the right at C4-5 as well as mild central canal stenosis at C6-7. Upper chest: Unremarkable Other: Advanced atherosclerotic calcification is seen within the left carotid bifurcation. The degree of stenosis is not well assessed on this noncontrast examination. IMPRESSION: No acute intracranial injury.  No calvarial fracture. 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 No acute fracture or listhesis of the cervical spine. Electronically Signed   By: Fidela Salisbury MD   On: 02/08/2020 03:25  CT Cervical Spine Wo Contrast  Result Date: 02/08/2020 CLINICAL DATA:  Head trauma EXAM: CT HEAD WITHOUT CONTRAST CT CERVICAL SPINE WITHOUT CONTRAST TECHNIQUE: Multidetector CT imaging of the head and cervical spine was performed following the standard protocol without intravenous contrast. Multiplanar CT image reconstructions of the cervical spine were also generated. COMPARISON:  CT head 11/26/2019 FINDINGS: CT HEAD FINDINGS Brain: Normal anatomic configuration of the brain. There is interval development of a small cortical infarct involving the right frontal lobe involving the precentral gyrus, best seen on axial image # 22/3. No significant associated mass effect. No midline shift. Mild periventricular white matter changes are again identified in keeping with probable small vessel ischemia. No abnormal intra or extra-axial mass lesion. No evidence of acute intracranial hemorrhage. Ventricular size is normal. Cerebellum is unremarkable. Vascular: No asymmetric hyperdense vasculature at the skull base. Skull: Intact Sinuses/Orbits: Visualized paranasal sinuses are clear. Orbits are unremarkable.  Other: Mastoid air cells and middle ear cavities are clear. CT CERVICAL SPINE FINDINGS Alignment: 3 mm anterolisthesis C4 upon C5 and C7 upon T1 is likely degenerative in nature. Otherwise normal cervical lordosis. Skull base and vertebrae: The craniocervical junction is unremarkable. The atlantodental interval is normal. No acute fracture of the cervical spine. No lytic or blastic bone lesion Soft tissues and spinal canal: No prevertebral fluid or swelling. No visible canal hematoma. Disc levels: Review of the sagittal images demonstrates preservation of vertebral body height. 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 Multiple erosions are seen at the base of the odontoid process likely related to synovial overgrowth. 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 The spinal canal appears widely patent. The prevertebral soft tissues are not thickened. Review of the axial images demonstrates multilevel uncovertebral and facet arthrosis resulting in multilevel neural foraminal narrowing, most severe on the left at C3-4 and on the right at C4-5 as well as mild central canal stenosis at C6-7. Upper chest: Unremarkable Other: Advanced atherosclerotic calcification is seen within the left carotid bifurcation. The degree of stenosis is not well assessed on this noncontrast examination. IMPRESSION: No acute intracranial injury.  No calvarial fracture. 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 No acute fracture or listhesis of the cervical spine. Electronically Signed   By: Fidela Salisbury MD   On: 02/08/2020 03:25   MR ANGIO HEAD WO CONTRAST  Result Date: 02/08/2020 CLINICAL DATA:  Neuro deficit, acute, stroke suspected. Additional history provided: Fall at home. EXAM: MRI  HEAD WITHOUT CONTRAST MRA HEAD WITHOUT CONTRAST TECHNIQUE: Multiplanar, multiecho pulse sequences of the brain and surrounding structures were obtained without intravenous contrast. Angiographic images of the head were obtained using MRA technique without contrast. COMPARISON:  Noncontrast head CT 02/08/2020. Brain MRI 11/06/2019. MRA head 09/26/2009. FINDINGS: MRI HEAD FINDINGS Brain: Cerebral volume is normal for age. There is a thin acute subdural hematoma overlying the anterolateral right frontal lobe measuring up to 4 mm in greatest thickness (for instance as seen on series 11, image 13). In retrospect, this is appreciable on the head CT performed earlier the same day. No significant mass effect upon the underlying right cerebral hemisphere. No midline shift. Moderate multifocal T2/FLAIR hyperintensity within the cerebral white matter and pons is nonspecific, but compatible with chronic small vessel ischemic disease. Unchanged 5 mm rounded extra-axial calcified focus overlying the anterior  left frontal lobe, suspected small incidental meningioma. There is no acute infarct. No extra-axial fluid collection. Vascular: Reported below. Skull and upper cervical spine: No focal marrow lesion. Cervical spondylosis. Ligamentous hypertrophy/pannus formation posterior to the dens. Sinuses/Orbits: Prior lens replacements. Trace ethmoid sinus mucosal thickening. Other: Left parietooccipital scalp soft tissue swelling. MRA HEAD FINDINGS The intracranial internal carotid arteries are patent. Moderate/severe atherosclerotic narrowing of the cavernous segments bilaterally, progressed as compared to the MRA of 09/26/2009. No M2 proximal branch occlusion. Atherosclerotic irregularity of the M2 and more distal MCA branch vessels bilaterally. Most notably, there is a moderate stenosis within a proximal M2 left MCA branch (series 1051, image 4). The anterior cerebral arteries are patent. The intracranial vertebral arteries are  patent. The basilar artery is patent. The posterior cerebral arteries are patent. Mild atherosclerotic narrowing of the proximal left PCA (series 1039, image 15). Mild/moderate narrowing of the left PCA at the P3/P4 junction. Posterior communicating arteries are present bilaterally. These results were called by telephone at the time of interpretation on 02/08/2020 at 7:48 am to provider Dr. Roslynn Amble, who verbally acknowledged these results. IMPRESSION: MRI brain: 1. Thin acute subdural hematoma overlying the anterolateral right frontal lobe, measuring up to 4 mm in thickness. 2. Moderate chronic small vessel ischemic disease, stable as compared to the brain MRI of 11/06/2019. 3. 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 4. Left parietooccipital scalp soft tissue swelling. MRA head: 1. No intracranial large vessel occlusion. 2. Intracranial atherosclerotic disease most notably as follows. 3. 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 4. Moderate focal stenosis within a proximal M2 left MCA branch vessel. 5. Mild/moderate stenosis of the left PCA at the P3/P4 junction. Electronically Signed   By: Kellie Simmering DO   On: 02/08/2020 07:49   MR ANGIO NECK WO CONTRAST  Result Date: 02/08/2020 CLINICAL DATA:  Stroke, follow-up. EXAM: MRA NECK WITHOUT CONTRAST TECHNIQUE: Angiographic images of the neck were obtained using MRA technique without intravenous contrast. Carotid stenosis measurements (when applicable) are obtained utilizing NASCET criteria, using the distal internal carotid diameter as the denominator. COMPARISON:  MRA neck 09/26/2009. FINDINGS: The right CCA and cervical ICA are patent without hemodynamically significant stenosis. Mild atherosclerotic irregularity of the proximal right ICA, progressed as compared to the MRA of 09/26/2009. The left CCA and cervical ICA are  patent. On the source time-of-flight sequence, there is apparent 60% stenosis at the origin of the left ICA, progressed as compared to the prior exam. However, this degree of stenosis is not definitively appreciable on the MIP reconstructions. Limited evaluation of the proximal vertebral arteries due to motion degradation and noncontrast technique. The vertebral arteries are otherwise patent within the neck without hemodynamically significant stenosis. IMPRESSION: 1. Apparent 60% stenosis at the origin of the left ICA on the source time-of-flight sequence, progressed from the MRA of 09/26/2009. However, this degree of stenosis is not definitively appreciated on the MIP reconstructions. Carotid artery duplex may be obtained for further evaluation, as clinically warranted. 2. The right CCA and ICA are patent within the neck without hemodynamically significant stenosis. Progressive mild atherosclerotic irregularity of the proximal right ICA. 3. Motion degradation and non-contrast technique preclude evaluation of the proximal vertebral arteries. The vertebral arteries are otherwise patent within the neck without significant stenosis. Electronically Signed   By: Kellie Simmering DO   On: 02/08/2020 08:04   MR BRAIN WO CONTRAST  Result Date:  02/08/2020 CLINICAL DATA:  Neuro deficit, acute, stroke suspected. Additional history provided: Fall at home. EXAM: MRI HEAD WITHOUT CONTRAST MRA HEAD WITHOUT CONTRAST TECHNIQUE: Multiplanar, multiecho pulse sequences of the brain and surrounding structures were obtained without intravenous contrast. Angiographic images of the head were obtained using MRA technique without contrast. COMPARISON:  Noncontrast head CT 02/08/2020. Brain MRI 11/06/2019. MRA head 09/26/2009. FINDINGS: MRI HEAD FINDINGS Brain: Cerebral volume is normal for age. There is a thin acute subdural hematoma overlying the anterolateral right frontal lobe measuring up to 4 mm in greatest thickness (for instance as  seen on series 11, image 13). In retrospect, this is appreciable on the head CT performed earlier the same day. No significant mass effect upon the underlying right cerebral hemisphere. No midline shift. Moderate multifocal T2/FLAIR hyperintensity within the cerebral white matter and pons is nonspecific, but compatible with chronic small vessel ischemic disease. Unchanged 5 mm rounded extra-axial calcified focus overlying the anterior left frontal lobe, suspected small incidental meningioma. There is no acute infarct. No extra-axial fluid collection. Vascular: Reported below. Skull and upper cervical spine: No focal marrow lesion. Cervical spondylosis. Ligamentous hypertrophy/pannus formation posterior to the dens. Sinuses/Orbits: Prior lens replacements. Trace ethmoid sinus mucosal thickening. Other: Left parietooccipital scalp soft tissue swelling. MRA HEAD FINDINGS The intracranial internal carotid arteries are patent. Moderate/severe atherosclerotic narrowing of the cavernous segments bilaterally, progressed as compared to the MRA of 09/26/2009. No M2 proximal branch occlusion. Atherosclerotic irregularity of the M2 and more distal MCA branch vessels bilaterally. Most notably, there is a moderate stenosis within a proximal M2 left MCA branch (series 1051, image 4). The anterior cerebral arteries are patent. The intracranial vertebral arteries are patent. The basilar artery is patent. The posterior cerebral arteries are patent. Mild atherosclerotic narrowing of the proximal left PCA (series 1039, image 15). Mild/moderate narrowing of the left PCA at the P3/P4 junction. Posterior communicating arteries are present bilaterally. These results were called by telephone at the time of interpretation on 02/08/2020 at 7:48 am to provider Dr. Roslynn Amble, who verbally acknowledged these results. IMPRESSION: MRI brain: 1. Thin acute subdural hematoma overlying the anterolateral right frontal lobe, measuring up to 4 mm in  thickness. 2. Moderate chronic small vessel ischemic disease, stable as compared to the brain MRI of 11/06/2019. 3. 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 4. Left parietooccipital scalp soft tissue swelling. MRA head: 1. No intracranial large vessel occlusion. 2. Intracranial atherosclerotic disease most notably as follows. 3. 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 4. Moderate focal stenosis within a proximal M2 left MCA branch vessel. 5. Mild/moderate stenosis of the left PCA at the P3/P4 junction. Electronically Signed   By: Kellie Simmering DO   On: 02/08/2020 07:49   DG Pelvis Portable  Result Date: 02/08/2020 CLINICAL DATA:  Pelvic trauma EXAM: PORTABLE PELVIS 1-2 VIEWS COMPARISON:  None. FINDINGS: Single view radiograph the pelvis demonstrates an acute, left subcapital femoral neck fracture with superior displacement, varus angulation, and external rotation of the distal fracture fragment. The femoral head is still seated within the left acetabulum. Mild bilateral hip joint space narrowing is present in keeping with mild bilateral degenerative arthritis. Pelvis and sacrum appear intact. Limited evaluation of the right hip is unremarkable. IMPRESSION: Acute, displaced, angulated subcapital left femoral neck fracture. Electronically Signed   By: Fidela Salisbury MD   On: 02/08/2020 02:44   DG Chest Port 1 [address]  Result Date: 02/08/2020 CLINICAL DATA:  Chest trauma EXAM: PORTABLE CHEST 1 VIEW COMPARISON:  10/04/2018 FINDINGS: The heart size and mediastinal contours are within normal limits. Both lungs are clear. The visualized skeletal structures are unremarkable. IMPRESSION: No active disease. Electronically Signed   By: Fidela Salisbury MD   On: 02/08/2020 02:42   ECHOCARDIOGRAM COMPLETE  Result Date: 02/08/2020    ECHOCARDIOGRAM REPORT   Patient Name:   [facility]  GARNER Herrera Date of Exam: 02/08/2020 Medical Rec #:  [id number]        Height:       66.0 in Accession #:    [id number]       Weight:       189.0 lb Date of Birth:  07-01-1940        BSA:          1.952 m Patient Age:    5 years         BP:           107/59 mmHg Patient Gender: F                HR:           76 bpm. Exam Location:  Inpatient Procedure: 2D Echo, Cardiac Doppler and Color Doppler Indications:    Stroke 434.91 / I163.9  History:        Patient has no prior history of Echocardiogram examinations.                 CAD; Risk Factors:Diabetes.  Sonographer:    Bernadene Person RDCS Referring Phys: [id number] Paulla Mcclaskey J [place] IMPRESSIONS  1. Left ventricular ejection fraction, by estimation, is 60 to 65%. The left ventricle has normal function. The left ventricle has no regional wall motion abnormalities. There is mild left ventricular hypertrophy. Left ventricular diastolic parameters are consistent with Grade I diastolic dysfunction (impaired relaxation).  2. Right ventricular systolic function is normal. The right ventricular size is normal. There is normal pulmonary artery systolic pressure. The estimated right ventricular systolic pressure is 79.0 mmHg.  3. The mitral valve is normal in structure. Trivial mitral valve regurgitation. No evidence of mitral stenosis.  4. The aortic valve is tricuspid. Aortic valve regurgitation is not visualized. No aortic stenosis is present.  5. The inferior vena cava is normal in size with <50% respiratory variability, suggesting right atrial pressure of 8 mmHg. FINDINGS  Left Ventricle: Left ventricular ejection fraction, by estimation, is 60 to 65%. The left ventricle has normal function. The left ventricle has no regional wall motion abnormalities. The left ventricular internal cavity size was normal in size. There is  mild left ventricular hypertrophy. Left ventricular diastolic parameters are consistent with Grade I diastolic dysfunction (impaired relaxation). Right  Ventricle: The right ventricular size is normal. No increase in right ventricular wall thickness. Right ventricular systolic function is normal. There is normal pulmonary artery systolic pressure. The tricuspid regurgitant velocity is 2.18 m/s, and  with an assumed right atrial pressure of 8 mmHg, the estimated right ventricular systolic pressure is 24.0 mmHg. Left Atrium: Left atrial size was normal in size. Right Atrium: Right atrial size was normal in size. Pericardium: Trivial pericardial effusion is present. Mitral Valve: The mitral valve is normal in structure. Trivial mitral valve regurgitation. No evidence of mitral valve stenosis. Tricuspid Valve: The tricuspid valve is normal in structure. Tricuspid valve regurgitation is trivial. Aortic Valve: The aortic valve is tricuspid. Aortic valve regurgitation is not visualized. No aortic stenosis is  present. Pulmonic Valve: The pulmonic valve was normal in structure. Pulmonic valve regurgitation is not visualized. Aorta: The aortic root is normal in size and structure. Venous: The inferior vena cava is normal in size with less than 50% respiratory variability, suggesting right atrial pressure of 8 mmHg. IAS/Shunts: No atrial level shunt detected by color flow Doppler.  LEFT VENTRICLE PLAX 2D LVIDd:         3.90 cm  Diastology LVIDs:         2.70 cm  LV e' medial:    6.48 cm/s LV PW:         0.80 cm  LV E/e' medial:  13.3 LV IVS:        1.40 cm  LV e' lateral:   7.80 cm/s LVOT diam:     1.80 cm  LV E/e' lateral: 11.1 LV SV:         75 LV SV Index:   39 LVOT Area:     2.54 cm  RIGHT VENTRICLE RV S prime:     11.10 cm/s TAPSE (M-mode): 1.7 cm LEFT ATRIUM             Index       RIGHT ATRIUM           Index LA diam:        3.00 cm 1.54 cm/m  RA Area:     16.10 cm LA Vol (A2C):   47.1 ml 24.13 ml/m RA Volume:   41.60 ml  21.31 ml/m LA Vol (A4C):   39.2 ml 20.08 ml/m LA Biplane Vol: 42.7 ml 21.87 ml/m  AORTIC VALVE LVOT Vmax:   146.00 cm/s LVOT Vmean:  102.000  cm/s LVOT VTI:    0.296 m  AORTA Ao Root diam: 3.30 cm Ao Asc diam:  3.40 cm MITRAL VALVE                TRICUSPID VALVE MV Area (PHT): 2.69 cm     TR Peak grad:   19.0 mmHg MV Decel Time: 282 msec     TR Vmax:        218.00 cm/s MV E velocity: 86.50 cm/s MV A velocity: 102.00 cm/s  SHUNTS MV E/A ratio:  0.85         Systemic VTI:  0.30 m                             Systemic Diam: 1.80 cm Loralie Champagne MD Electronically signed by Loralie Champagne MD Signature Date/Time: 02/08/2020/3:32:08 PM    Final         Scheduled Meds: . Derrill Memo ON 02/10/2020] feeding supplement (GLUCERNA SHAKE)  237 mL Oral TID BM  . insulin aspart  0-9 Units Subcutaneous Q4H  . metoprolol succinate  12.5 mg Oral Daily  . [START ON 02/10/2020] multivitamin with minerals  1 tablet Oral Daily   Continuous Infusions:   LOS: 1 day    Time spent: 38 minutes spent on chart review, discussion with nursing staff, consultants, updating family and interview/physical exam; more than 50% of that time was spent in counseling and/or coordination of care.    Lilyona Richner J [place] DO [facility] Available via Epic secure chat 7am-7pm After these hours, please refer to coverage provider listed on [email or web address] 02/09/2020, 3:25 PM

## 2020-02-09 NOTE — Progress Notes (Signed)
Pt came to unit with R arm restricted extremity, pink bracelet in place. Pt's husband at bedside stated that the restricted extremity is the L arm and not the R arm. Pink bracelet placed on L arm, removed pink bracelet on R arm.

## 2020-02-10 ENCOUNTER — Encounter (HOSPITAL_COMMUNITY): Payer: Self-pay | Admitting: Internal Medicine

## 2020-02-10 ENCOUNTER — Inpatient Hospital Stay (HOSPITAL_COMMUNITY): Payer: Medicare Other

## 2020-02-10 ENCOUNTER — Inpatient Hospital Stay (HOSPITAL_COMMUNITY): Payer: Medicare Other | Admitting: Anesthesiology

## 2020-02-10 ENCOUNTER — Encounter (HOSPITAL_COMMUNITY)
Admission: EM | Disposition: A | Discharge status: Rehabilitation Facility | Payer: Self-pay | Source: Home / Self Care | Attending: Internal Medicine

## 2020-02-10 DIAGNOSIS — E1159 Type 2 diabetes mellitus with other circulatory complications: Secondary | ICD-10-CM | POA: Diagnosis not present

## 2020-02-10 DIAGNOSIS — S065X9A Traumatic subdural hemorrhage with loss of consciousness of unspecified duration, initial encounter: Secondary | ICD-10-CM | POA: Diagnosis not present

## 2020-02-10 DIAGNOSIS — I5032 Chronic diastolic (congestive) heart failure: Secondary | ICD-10-CM | POA: Diagnosis not present

## 2020-02-10 DIAGNOSIS — S72012A Unspecified intracapsular fracture of left femur, initial encounter for closed fracture: Secondary | ICD-10-CM | POA: Diagnosis not present

## 2020-02-10 DIAGNOSIS — J9601 Acute respiratory failure with hypoxia: Secondary | ICD-10-CM | POA: Diagnosis not present

## 2020-02-10 HISTORY — PX: HIP ARTHROPLASTY: SHX981

## 2020-02-10 LAB — BASIC METABOLIC PANEL
Anion gap: 6 (ref 5–15)
BUN: 21 mg/dL (ref 8–23)
CO2: 28 mmol/L (ref 22–32)
Calcium: 9 mg/dL (ref 8.9–10.3)
Chloride: 103 mmol/L (ref 98–111)
Creatinine, Ser: 1.46 mg/dL — ABNORMAL HIGH (ref 0.44–1.00)
GFR, Estimated: 36 mL/min — ABNORMAL LOW (ref >60)
Glucose, Bld: 127 mg/dL — ABNORMAL HIGH (ref 70–99)
Potassium: 4.2 mmol/L (ref 3.5–5.1)
Sodium: 137 mmol/L (ref 135–145)

## 2020-02-10 LAB — CBC
HCT: 35.6 % — ABNORMAL LOW (ref 36.0–46.0)
Hemoglobin: 11 g/dL — ABNORMAL LOW (ref 12.0–15.0)
MCH: 28.4 pg (ref 26.0–34.0)
MCHC: 30.9 g/dL (ref 30.0–36.0)
MCV: 92 fL (ref 80.0–100.0)
Platelets: 156 10*3/uL (ref 150–400)
RBC: 3.87 MIL/uL (ref 3.87–5.11)
RDW: 14.6 % (ref 11.5–15.5)
WBC: 11.5 10*3/uL — ABNORMAL HIGH (ref 4.0–10.5)
nRBC: 0 % (ref 0.0–0.2)

## 2020-02-10 LAB — GLUCOSE, CAPILLARY
Glucose-Capillary: 106 mg/dL — ABNORMAL HIGH (ref 70–99)
Glucose-Capillary: 119 mg/dL — ABNORMAL HIGH (ref 70–99)
Glucose-Capillary: 106 mg/dL — ABNORMAL HIGH (ref 70–99)
Glucose-Capillary: 141 mg/dL — ABNORMAL HIGH (ref 70–99)

## 2020-02-10 LAB — PREPARE RBC (CROSSMATCH)

## 2020-02-10 SURGERY — HEMIARTHROPLASTY, HIP, DIRECT ANTERIOR APPROACH, FOR FRACTURE
Anesthesia: General | Site: Hip | Laterality: Left

## 2020-02-10 MED ORDER — LIDOCAINE 2% (20 MG/ML) 5 ML SYRINGE
INTRAMUSCULAR | Status: AC
  Filled 2020-02-10: qty 5

## 2020-02-10 MED ORDER — DOCUSATE SODIUM 100 MG PO CAPS: 100.0000 mg | ORAL_CAPSULE | Freq: Two times a day (BID) | ORAL | Status: DC

## 2020-02-10 MED ORDER — METOCLOPRAMIDE HCL 5 MG PO TABS: 5.0000 mg | ORAL_TABLET | Freq: Three times a day (TID) | ORAL | Status: DC | PRN

## 2020-02-10 MED ORDER — FENTANYL CITRATE (PF) 250 MCG/5ML IJ SOLN
INTRAMUSCULAR | Status: AC
  Filled 2020-02-10: qty 5

## 2020-02-10 MED ORDER — TRANEXAMIC ACID-NACL 1000-0.7 MG/100ML-% IV SOLN
INTRAVENOUS | Status: AC
  Filled 2020-02-10: qty 100

## 2020-02-10 MED ORDER — LACTATED RINGERS IV SOLN: INTRAVENOUS | Status: DC

## 2020-02-10 MED ORDER — MENTHOL 3 MG MT LOZG: 1.0000 | LOZENGE | OROMUCOSAL | Status: DC | PRN

## 2020-02-10 MED ORDER — PROPOFOL 10 MG/ML IV BOLUS
INTRAVENOUS | Status: DC | PRN
  Administered 2020-02-10: 120 mg via INTRAVENOUS

## 2020-02-10 MED ORDER — ACETAMINOPHEN 500 MG PO TABS
1000.0000 mg | ORAL_TABLET | Freq: Once | ORAL | Status: AC
  Administered 2020-02-10: 1000 mg via ORAL
  Filled 2020-02-10: qty 2

## 2020-02-10 MED ORDER — PHENYLEPHRINE 40 MCG/ML (10ML) SYRINGE FOR IV PUSH (FOR BLOOD PRESSURE SUPPORT)
PREFILLED_SYRINGE | INTRAVENOUS | Status: DC | PRN
  Administered 2020-02-10 (×3): 120 ug via INTRAVENOUS
  Administered 2020-02-10: 80 ug via INTRAVENOUS

## 2020-02-10 MED ORDER — PROPOFOL 10 MG/ML IV BOLUS
INTRAVENOUS | Status: AC
  Filled 2020-02-10: qty 20

## 2020-02-10 MED ORDER — POVIDONE-IODINE 10 % EX SWAB
2.0000 "application " | Freq: Once | CUTANEOUS | Status: AC
  Administered 2020-02-10: 2 via TOPICAL

## 2020-02-10 MED ORDER — ONDANSETRON HCL 4 MG/2ML IJ SOLN
INTRAMUSCULAR | Status: AC
  Filled 2020-02-10: qty 2

## 2020-02-10 MED ORDER — CEFAZOLIN SODIUM-DEXTROSE 2-4 GM/100ML-% IV SOLN
2.0000 g | INTRAVENOUS | Status: AC
  Administered 2020-02-10: 2 g via INTRAVENOUS

## 2020-02-10 MED ORDER — PHENOL 1.4 % MT LIQD: 1.0000 | OROMUCOSAL | Status: DC | PRN

## 2020-02-10 MED ORDER — HYDROCODONE-ACETAMINOPHEN 5-325 MG PO TABS: 1.0000 | ORAL_TABLET | ORAL | Status: DC | PRN

## 2020-02-10 MED ORDER — SODIUM CHLORIDE 0.9 % IV SOLN: INTRAVENOUS | Status: DC | PRN

## 2020-02-10 MED ORDER — VANCOMYCIN HCL 1000 MG IV SOLR
INTRAVENOUS | Status: AC
  Filled 2020-02-10: qty 1000

## 2020-02-10 MED ORDER — PHENYLEPHRINE 40 MCG/ML (10ML) SYRINGE FOR IV PUSH (FOR BLOOD PRESSURE SUPPORT)
PREFILLED_SYRINGE | INTRAVENOUS | Status: AC
  Filled 2020-02-10: qty 20

## 2020-02-10 MED ORDER — FENTANYL CITRATE (PF) 100 MCG/2ML IJ SOLN
INTRAMUSCULAR | Status: DC | PRN
  Administered 2020-02-10: 25 ug via INTRAVENOUS
  Administered 2020-02-10: 100 ug via INTRAVENOUS
  Administered 2020-02-10: 50 ug via INTRAVENOUS
  Administered 2020-02-10: 25 ug via INTRAVENOUS
  Administered 2020-02-10: 50 ug via INTRAVENOUS

## 2020-02-10 MED ORDER — SUCCINYLCHOLINE CHLORIDE 200 MG/10ML IV SOSY
PREFILLED_SYRINGE | INTRAVENOUS | Status: AC
  Filled 2020-02-10: qty 10

## 2020-02-10 MED ORDER — ALBUMIN HUMAN 5 % IV SOLN: INTRAVENOUS | Status: DC | PRN

## 2020-02-10 MED ORDER — CHLORHEXIDINE GLUCONATE 0.12 % MT SOLN: 15.0000 mL | Freq: Once | OROMUCOSAL | Status: AC

## 2020-02-10 MED ORDER — SUGAMMADEX SODIUM 200 MG/2ML IV SOLN
INTRAVENOUS | Status: DC | PRN
  Administered 2020-02-10: 200 mg via INTRAVENOUS

## 2020-02-10 MED ORDER — METOCLOPRAMIDE HCL 5 MG/ML IJ SOLN: 5.0000 mg | Freq: Three times a day (TID) | INTRAMUSCULAR | Status: DC | PRN

## 2020-02-10 MED ORDER — DEXAMETHASONE SODIUM PHOSPHATE 10 MG/ML IJ SOLN
INTRAMUSCULAR | Status: AC
  Filled 2020-02-10: qty 1

## 2020-02-10 MED ORDER — FENTANYL CITRATE (PF) 100 MCG/2ML IJ SOLN: 25.0000 ug | INTRAMUSCULAR | Status: DC | PRN

## 2020-02-10 MED ORDER — CLOPIDOGREL BISULFATE 75 MG PO TABS
75.0000 mg | ORAL_TABLET | Freq: Every day | ORAL | Status: DC
  Administered 2020-02-11 – 2020-02-15 (×5): 75 mg via ORAL
  Filled 2020-02-10 (×5): qty 1

## 2020-02-10 MED ORDER — 0.9 % SODIUM CHLORIDE (POUR BTL) OPTIME
TOPICAL | Status: DC | PRN
  Administered 2020-02-10: 1000 mL

## 2020-02-10 MED ORDER — SENNOSIDES-DOCUSATE SODIUM 8.6-50 MG PO TABS: 1.0000 | ORAL_TABLET | Freq: Every evening | ORAL | Status: DC | PRN

## 2020-02-10 MED ORDER — CEFAZOLIN SODIUM-DEXTROSE 2-4 GM/100ML-% IV SOLN
2.0000 g | Freq: Four times a day (QID) | INTRAVENOUS | Status: AC
  Administered 2020-02-10 – 2020-02-11 (×2): 2 g via INTRAVENOUS
  Filled 2020-02-10 (×2): qty 100

## 2020-02-10 MED ORDER — DEXAMETHASONE SODIUM PHOSPHATE 10 MG/ML IJ SOLN
INTRAMUSCULAR | Status: DC | PRN
  Administered 2020-02-10: 10 mg via INTRAVENOUS

## 2020-02-10 MED ORDER — PHENYLEPHRINE HCL-NACL 10-0.9 MG/250ML-% IV SOLN
INTRAVENOUS | Status: DC | PRN
  Administered 2020-02-10: 25 ug/min via INTRAVENOUS

## 2020-02-10 MED ORDER — DOCUSATE SODIUM 100 MG PO CAPS
100.0000 mg | ORAL_CAPSULE | Freq: Two times a day (BID) | ORAL | Status: DC
  Administered 2020-02-10 – 2020-02-15 (×10): 100 mg via ORAL
  Filled 2020-02-10 (×10): qty 1

## 2020-02-10 MED ORDER — ALBUTEROL SULFATE (2.5 MG/3ML) 0.083% IN NEBU
2.5000 mg | INHALATION_SOLUTION | RESPIRATORY_TRACT | Status: AC
  Administered 2020-02-10: 2.5 mg via RESPIRATORY_TRACT

## 2020-02-10 MED ORDER — METOCLOPRAMIDE HCL 5 MG PO TABS
5.0000 mg | ORAL_TABLET | Freq: Three times a day (TID) | ORAL | Status: DC | PRN
  Filled 2020-02-10: qty 2

## 2020-02-10 MED ORDER — CEFAZOLIN SODIUM-DEXTROSE 2-4 GM/100ML-% IV SOLN
INTRAVENOUS | Status: AC
  Filled 2020-02-10: qty 100

## 2020-02-10 MED ORDER — SODIUM CHLORIDE 0.9% IV SOLUTION: Freq: Once | INTRAVENOUS | Status: DC

## 2020-02-10 MED ORDER — ORAL CARE MOUTH RINSE: 15.0000 mL | Freq: Once | OROMUCOSAL | Status: AC

## 2020-02-10 MED ORDER — ONDANSETRON HCL 4 MG/2ML IJ SOLN
INTRAMUSCULAR | Status: DC | PRN
  Administered 2020-02-10: 4 mg via INTRAVENOUS

## 2020-02-10 MED ORDER — ALBUTEROL SULFATE (2.5 MG/3ML) 0.083% IN NEBU
INHALATION_SOLUTION | RESPIRATORY_TRACT | Status: AC
  Filled 2020-02-10: qty 3

## 2020-02-10 MED ORDER — VANCOMYCIN HCL 1 G IV SOLR: INTRAVENOUS | Status: DC | PRN

## 2020-02-10 MED ORDER — NAPROXEN 250 MG PO TABS
250.0000 mg | ORAL_TABLET | Freq: Two times a day (BID) | ORAL | Status: DC
  Administered 2020-02-11 – 2020-02-15 (×9): 250 mg via ORAL
  Filled 2020-02-10 (×9): qty 1

## 2020-02-10 MED ORDER — BISACODYL 10 MG RE SUPP
10.0000 mg | Freq: Every day | RECTAL | Status: DC | PRN
  Filled 2020-02-10: qty 1

## 2020-02-10 MED ORDER — ROCURONIUM BROMIDE 10 MG/ML (PF) SYRINGE
PREFILLED_SYRINGE | INTRAVENOUS | Status: DC | PRN
  Administered 2020-02-10: 20 mg via INTRAVENOUS
  Administered 2020-02-10: 50 mg via INTRAVENOUS

## 2020-02-10 MED ORDER — ASPIRIN 81 MG PO CHEW
81.0000 mg | CHEWABLE_TABLET | Freq: Two times a day (BID) | ORAL | Status: DC
  Administered 2020-02-10 – 2020-02-15 (×10): 81 mg via ORAL
  Filled 2020-02-10 (×10): qty 1

## 2020-02-10 MED ORDER — TRANEXAMIC ACID-NACL 1000-0.7 MG/100ML-% IV SOLN
1000.0000 mg | INTRAVENOUS | Status: AC
  Administered 2020-02-10: 1000 mg via INTRAVENOUS

## 2020-02-10 MED ORDER — SUCCINYLCHOLINE CHLORIDE 200 MG/10ML IV SOSY
PREFILLED_SYRINGE | INTRAVENOUS | Status: DC | PRN
  Administered 2020-02-10: 120 mg via INTRAVENOUS

## 2020-02-10 MED ORDER — CHLORHEXIDINE GLUCONATE 0.12 % MT SOLN
OROMUCOSAL | Status: AC
  Administered 2020-02-10: 15 mL via OROMUCOSAL
  Filled 2020-02-10: qty 15

## 2020-02-10 MED ORDER — ROCURONIUM BROMIDE 10 MG/ML (PF) SYRINGE
PREFILLED_SYRINGE | INTRAVENOUS | Status: AC
  Filled 2020-02-10: qty 10

## 2020-02-10 MED ORDER — LIDOCAINE 2% (20 MG/ML) 5 ML SYRINGE
INTRAMUSCULAR | Status: DC | PRN
  Administered 2020-02-10: 80 mg via INTRAVENOUS

## 2020-02-10 SURGICAL SUPPLY — 55 items
APL PRP STRL LF DISP 70% ISPRP (MISCELLANEOUS) ×2
BLADE SAGITTAL 25.0X1.27X90 (BLADE) ×2 IMPLANT
CHLORAPREP W/TINT 26 (MISCELLANEOUS) ×4 IMPLANT
CLOSURE STERI-STRIP 1/4X4 (GAUZE/BANDAGES/DRESSINGS) ×1 IMPLANT
CLSR STERI-STRIP ANTIMIC 1/2X4 (GAUZE/BANDAGES/DRESSINGS) ×2 IMPLANT
COVER SURGICAL LIGHT HANDLE (MISCELLANEOUS) ×2 IMPLANT
DRAPE INCISE IOBAN 66X45 STRL (DRAPES) ×2 IMPLANT
DRAPE ORTHO SPLIT 77X108 STRL (DRAPES) ×4
DRAPE SURG ORHT 6 SPLT 77X108 (DRAPES) ×2 IMPLANT
DRAPE U-SHAPE 47X51 STRL (DRAPES) ×2 IMPLANT
DRSG MEPILEX BORDER 4X12 (GAUZE/BANDAGES/DRESSINGS) ×1 IMPLANT
ELECT BLADE 4.0 EZ CLEAN MEGAD (MISCELLANEOUS) ×2
ELECT CAUTERY BLADE 6.4 (BLADE) ×2 IMPLANT
ELECT REM PT RETURN 9FT ADLT (ELECTROSURGICAL) ×2
ELECTRODE BLDE 4.0 EZ CLN MEGD (MISCELLANEOUS) IMPLANT
ELECTRODE REM PT RTRN 9FT ADLT (ELECTROSURGICAL) ×1 IMPLANT
GLOVE BIO SURGEON STRL SZ 6.5 (GLOVE) ×2 IMPLANT
GLOVE BIO SURGEON STRL SZ8 (GLOVE) ×4 IMPLANT
GLOVE BIOGEL PI IND STRL 8 (GLOVE) ×1 IMPLANT
GLOVE BIOGEL PI INDICATOR 8 (GLOVE) ×1
GLOVE INDICATOR 6.5 STRL GRN (GLOVE) ×2 IMPLANT
GOWN STRL REUS W/ TWL LRG LVL3 (GOWN DISPOSABLE) ×2 IMPLANT
GOWN STRL REUS W/ TWL XL LVL3 (GOWN DISPOSABLE) ×2 IMPLANT
GOWN STRL REUS W/TWL LRG LVL3 (GOWN DISPOSABLE) ×4
GOWN STRL REUS W/TWL XL LVL3 (GOWN DISPOSABLE) ×4
HEAD MODULAR ENDO (Orthopedic Implant) ×2 IMPLANT
HEAD UNPLR 46XMDLR STRL HIP (Orthopedic Implant) IMPLANT
KIT BASIN OR (CUSTOM PROCEDURE TRAY) ×2 IMPLANT
KIT TURNOVER KIT B (KITS) ×2 IMPLANT
MANIFOLD NEPTUNE II (INSTRUMENTS) ×2 IMPLANT
NDL MAYO TROCAR (NEEDLE) IMPLANT
NEEDLE MAYO TROCAR (NEEDLE) ×2 IMPLANT
PACK TOTAL JOINT (CUSTOM PROCEDURE TRAY) ×2 IMPLANT
PAD ARMBOARD 7.5X6 YLW CONV (MISCELLANEOUS) ×4 IMPLANT
PILLOW ABDUCTION MEDIUM (MISCELLANEOUS) ×2 IMPLANT
RETRIEVER SUT HEWSON (MISCELLANEOUS) ×3 IMPLANT
SLEEVE UNITRAX (Orthopedic Implant) ×1 IMPLANT
STAPLER VISISTAT 35W (STAPLE) ×1 IMPLANT
STEM HIP 4 127DEG (Stem) ×1 IMPLANT
SUT FIBERWIRE #2 38 REV NDL BL (SUTURE) ×4
SUT MON AB 3-0 SH 27 (SUTURE) ×2
SUT MON AB 3-0 SH27 (SUTURE) IMPLANT
SUT VIC AB 0 CT1 27 (SUTURE) ×4
SUT VIC AB 0 CT1 27XBRD ANBCTR (SUTURE) ×2 IMPLANT
SUT VIC AB 1 CT1 27 (SUTURE) ×4
SUT VIC AB 1 CT1 27XBRD ANBCTR (SUTURE) IMPLANT
SUT VIC AB 2-0 CT1 27 (SUTURE) ×4
SUT VIC AB 2-0 CT1 TAPERPNT 27 (SUTURE) ×2 IMPLANT
SUT VIC AB 2-0 SH 27 (SUTURE) ×2
SUT VIC AB 2-0 SH 27XBRD (SUTURE) IMPLANT
SUT VLOC 180 0 24IN GS25 (SUTURE) ×1 IMPLANT
SUT VLOC 180 0 9IN  GS21 (SUTURE) ×2
SUT VLOC 180 0 9IN GS21 (SUTURE) IMPLANT
SUTURE FIBERWR#2 38 REV NDL BL (SUTURE) ×2 IMPLANT
TRAY FOLEY W/BAG SLVR 14FR (SET/KITS/TRAYS/PACK) ×2 IMPLANT

## 2020-02-10 NOTE — Progress Notes (Signed)
PROGRESS NOTE    [name]  EVO:[id number] DOB: January 03, 1941 DOA: 02/08/2020 PCP: [name], DO    Brief Narrative:  [name] is a 79 year old female with past medical history significant for CAD s/p PCI/stent, CKD stage IV (follows with Nephrology, [name]), type 2 diabetes mellitus, chronic diastolic CHF, chronic bilateral lower extremity edema who presented to the ED following fall at home as she was trying to get out of the shower.  No LOC reported.  Patient with complaint of left hip pain.  In the ED, pelvis x-Hammer notable for acute displaced angulated left femoral neck fracture.  Patient was noted to have a contusion/abrasion to posterior scalp, head CT and C-spine notable for possible subacute infarct right frontal area.  Neurology and orthopedics were consulted.  Creatinine 1.7 with glucose 179.  Hospital service consulted for admission for acute left hip fracture and concern of CVA.   Assessment & Plan:   Principal Problem:   Hip fracture, left, closed, initial encounter [facility]) Active Problems:   CAD (coronary artery disease)   CKD (chronic kidney disease) stage 4, GFR 15-29 ml/min (HCC)   Chronic diastolic CHF (congestive heart failure) (HCC)   Type 2 diabetes mellitus with vascular disease (HCC)   Hip fracture (HCC)   Closed fracture of neck of left femur (HCC)   Left femoral neck fracture, closed Patient presenting from home following a fall in shower with associated left hip pain.  X-Gorr pelvis notable for acute displaced, angulated subcapital left femoral neck fracture. --Orthopedics following, [name]; appreciate assistance --Norco 5-325mg  1-2 tabs q6hr prn moderate pain --morphine 1 mg every 2 hours prn severe pain --pending hip hemiarthroplasty today  Right frontal subdural hematoma Presenting following a fall in the bathtub in which she struck the back of her head.  Initial CT head/C-spine shows no acute intracranial injury, no acute fracture or  listhesis but does note possible subacute versus remote cortical infarct precentral gyrus of right frontal lobe.  MR brain notable for thin acute 4 mm subdural hematoma right frontal lobe, no reports of ischemic CVA.  Neurosurgery was consulted, no surgical issue and no repeat scan should be obtained per [name]; and even questions if hematoma exists.  --Supportive care, continue neurochecks --Neurosurgery signed off 02/08/2020  CAD s/p PCI/stent On Plavix 75 mg p.o. daily, Crestor 20 mg p.o. daily, metoprolol succinate 12.5 mg p.o. nightly.  Follows with cardiology outpatient, [name].  Previous bare-metal stent/PCI LAD 2006. --Continue metoprolol succinate 12.5mg  daily --Hold Plavix and Crestor  Chronic diastolic congestive heart failure --Continue metoprolol succinate 12.5 mg p.o. daily --Also on Bumex and metolazone (Mon/Thur) - will hold for now --Strict I's and O's and daily weights  CKD stage IV Follows with nephrology outpatient, [name].  Creatinine 1.70 on admission, baseline 1.6-1.9. On Bumex and metolazone outpatient. --Cr 1.70>1.69>1.46 --Continue monitor strict I's and O's and daily weights --Hold diuretics for now  Type 2 diabetes mellitus Home regimen includes Januvia 25 mg p.o. daily.  Hemoglobin A1c 8.8, poorly controlled --Hold oral hypoglycemics while inpatient --Insulin sliding scale for coverage --CBGs qAC/HS  HLD --hold Crestor for now  Hx Breast Ca Diagnosed in 2018, follows with [name] outpatient; last visit 12/05/2019  Preoperative risk stratification Patient with history of diastolic congestive heart failure with preserved EF of 65 to 70% on prior TTE 2019 and history of ischemic heart disease status post PCI bare-metal stent to LAD 2006.  Patient is a class III risk with a  30-day mortality of 10.1%.  Given that she was fairly independent prior to fall with subsequent hip fracture, it is reasonable to proceed with operative management.   DVT  prophylaxis: SCDs Code Status: Full Code Family Communication: Patient spouse updated at bedside with review of plan of care and images  Disposition Plan:  Status is: Inpatient  Remains inpatient appropriate because:Ongoing active pain requiring inpatient pain management, Ongoing diagnostic testing needed not appropriate for outpatient work up, Unsafe d/c plan, IV treatments appropriate due to intensity of illness or inability to take PO and Inpatient level of care appropriate due to severity of illness   Dispo: The patient is from: Home              Anticipated d/c is to: SNF              Anticipated d/c date is: 3 days              Patient currently is not medically stable to d/c.   Consultants:   Orthopedics, Dr. Griffin Basil  Neurosurgery, [name] - signed off 02/08/2020  Neurology - signed off 02/08/2020  Procedures:   None  Antimicrobials:   None   Subjective: Patient seen and examined at bedside, spouse present.  Patient continues with some mild hip pain.  Awaiting operative management later today.  Spouse, prior [employer] and concerned about how displaced her fracture was; reviewed images with patient and spouse in room this morning.  No other complaints or concerns at this time.  Patient and family very appreciative all the care they've received at [facility], with attentive nursing staff. Patient  denies chest pain, no shortness breath, no abdominal pain, no fever/chills/night sweats, no nausea/vomiting/diarrhea.  Objective: Vitals:   02/10/20 0500 02/10/20 0638 02/10/20 0815 02/10/20 1125  BP:  (!) 154/78 117/65 (!) 141/76  Pulse:  79 82 81  Resp:  17 16 16   Temp:  98.6 F (37 C) 99.6 F (37.6 C) 98.7 F (37.1 C)  TempSrc:  Oral Oral Oral  SpO2:  98% 97% 96%  Weight: 88.3 kg     Height:        Intake/Output Summary (Last 24 hours) at 02/10/2020 1419 Last data filed at 02/10/2020 1403 Gross per 24 hour  Intake 240 ml  Output 1770 ml  Net -1530 ml    Filed Weights   02/08/20 0228 02/10/20 0500  Weight: 85.7 kg 88.3 kg    Examination:  General exam: Appears calm, uncomfortable secondary to acute left hip pain HEENT: Noted posterior scalp abrasion/contusion Respiratory system: Clear to auscultation. Respiratory effort normal.  Oxygenating well on room air Cardiovascular system: S1 & S2 heard, RRR. No JVD, murmurs, rubs, gallops or clicks. No pedal edema. Gastrointestinal system: Abdomen is nondistended, soft and nontender. No organomegaly or masses felt. Normal bowel sounds heard. Central nervous system: Alert and oriented. No focal neurological deficits. Extremities: Moves all extremities independently besides left lower extremity, LLE shortened with external rotation Skin: No rashes, lesions or ulcers Psychiatry: Judgement and insight appear normal. Mood & affect appropriate.     Data Reviewed: I have personally reviewed following labs and imaging studies  CBC: Recent Labs  Lab 02/08/20 0235 02/08/20 0241 02/09/20 0403 02/10/20 0246  WBC 8.1  --  9.1 11.5*  NEUTROABS 6.4  --   --   --   HGB 13.3 14.6 11.4* 11.0*  HCT 43.4 43.0 37.2 35.6*  MCV 94.1  --  94.2 92.0  PLT 217  --  172 564   Basic Metabolic Panel: Recent Labs  Lab 02/08/20 0235 02/08/20 0241 02/09/20 0403 02/10/20 0246  NA 138 137 138 137  K 4.9 4.9 4.7 4.2  CL 99 99 101 103  CO2 29  --  30 28  GLUCOSE 179* 173* 130* 127*  BUN 29* 38* 23 21  CREATININE 1.77* 1.70* 1.69* 1.46*  CALCIUM 10.0  --  9.0 9.0  MG  --   --  2.2  --    GFR: Estimated Creatinine Clearance: 33.8 mL/min (A) (by C-G formula based on SCr of 1.46 mg/dL (H)). Liver Function Tests: Recent Labs  Lab 02/08/20 0235  AST 44*  ALT 29  ALKPHOS 104  BILITOT 0.5  PROT 7.2  ALBUMIN 3.8   No results for input(s): LIPASE, AMYLASE in the last 168 hours. No results for input(s): AMMONIA in the last 168 hours. Coagulation Profile: Recent Labs  Lab 02/08/20 0235  INR 1.1    Cardiac Enzymes: No results for input(s): CKTOTAL, CKMB, CKMBINDEX, TROPONINI in the last 168 hours. BNP (last 3 results) No results for input(s): PROBNP in the last 8760 hours. HbA1C: Recent Labs    02/08/20 0753  HGBA1C 8.8*   CBG: Recent Labs  Lab 02/09/20 2059 02/09/20 2347 02/10/20 0628 02/10/20 0817 02/10/20 1122  GLUCAP 100* 129* 106* 119* 106*   Lipid Profile: Recent Labs    02/08/20 0754  CHOL 148  HDL 36*  LDLCALC 73  TRIG 193*  CHOLHDL 4.1   Thyroid Function Tests: No results for input(s): TSH, T4TOTAL, FREET4, T3FREE, THYROIDAB in the last 72 hours. Anemia Panel: No results for input(s): VITAMINB12, FOLATE, FERRITIN, TIBC, IRON, RETICCTPCT in the last 72 hours. Sepsis Labs: No results for input(s): PROCALCITON, LATICACIDVEN in the last 168 hours.  Recent Results (from the past 240 hour(s))  Respiratory Panel by RT PCR (Flu A&B, Covid) - Nasopharyngeal Swab     Status: None   Collection Time: 02/08/20  3:42 AM   Specimen: Nasopharyngeal Swab  Result Value Ref Range Status   SARS Coronavirus 2 by RT PCR NEGATIVE NEGATIVE Final    Comment: (NOTE) SARS-CoV-2 target nucleic acids are NOT DETECTED.  The SARS-CoV-2 RNA is generally detectable in upper respiratoy specimens during the acute phase of infection. The lowest concentration of SARS-CoV-2 viral copies this assay can detect is 131 copies/mL. A negative result does not preclude SARS-Cov-2 infection and should not be used as the sole basis for treatment or other patient management decisions. A negative result may occur with  improper specimen collection/handling, submission of specimen other than nasopharyngeal swab, presence of viral mutation(s) within the areas targeted by this assay, and inadequate number of viral copies (<131 copies/mL). A negative result must be combined with clinical observations, patient history, and epidemiological information. The expected result is Negative.  Fact  Sheet for Patients:  PinkCheek.be  Fact Sheet for Healthcare Providers:  GravelBags.it  This test is no t yet approved or cleared by the [place] FDA and  has been authorized for detection and/or diagnosis of SARS-CoV-2 by FDA under an Emergency Use Authorization (EUA). This EUA will remain  in effect (meaning this test can be used) for the duration of the COVID-19 declaration under Section 564(b)(1) of the Act, 21 U.S.C. section 360bbb-3(b)(1), unless the authorization is terminated or revoked sooner.     Influenza A by PCR NEGATIVE NEGATIVE Final   Influenza B by PCR NEGATIVE NEGATIVE Final    Comment: (NOTE) The Xpert Xpress SARS-CoV-2/FLU/RSV  assay is intended as an aid in  the diagnosis of influenza from Nasopharyngeal swab specimens and  should not be used as a sole basis for treatment. Nasal washings and  aspirates are unacceptable for Xpert Xpress SARS-CoV-2/FLU/RSV  testing.  Fact Sheet for Patients: PinkCheek.be  Fact Sheet for Healthcare Providers: GravelBags.it  This test is not yet approved or cleared by the [place] FDA and  has been authorized for detection and/or diagnosis of SARS-CoV-2 by  FDA under an Emergency Use Authorization (EUA). This EUA will remain  in effect (meaning this test can be used) for the duration of the  Covid-19 declaration under Section 564(b)(1) of the Act, 21  U.S.C. section 360bbb-3(b)(1), unless the authorization is  terminated or revoked. Performed at [facility], [place] [address], [place], [place] [address]   Surgical pcr screen     Status: None   Collection Time: 02/08/20  5:42 PM   Specimen: Nasal Mucosa; Nasal Swab  Result Value Ref Range Status   MRSA, PCR NEGATIVE NEGATIVE Final   Staphylococcus aureus NEGATIVE NEGATIVE Final    Comment: (NOTE) The Xpert SA Assay (FDA approved for NASAL  specimens in patients 43 years of age and older), is one component of a comprehensive surveillance program. It is not intended to diagnose infection nor to guide or monitor treatment. Performed at [facility], [place] [address] [place], [place] [address]          Radiology Studies: No results found.      Scheduled Meds: . sodium chloride   Intravenous Once  . [MAR Hold] Chlorhexidine Gluconate Cloth  6 each Topical Daily  . [MAR Hold] feeding supplement (GLUCERNA SHAKE)  237 mL Oral TID BM  . [MAR Hold] insulin aspart  0-9 Units Subcutaneous Q4H  . [MAR Hold] metoprolol succinate  12.5 mg Oral Daily  . [MAR Hold] multivitamin with minerals  1 tablet Oral Daily   Continuous Infusions: . [START ON 02/11/2020]  ceFAZolin (ANCEF) IV    . lactated ringers 10 mL/hr at 02/10/20 1345  . tranexamic acid       LOS: 2 days    Time spent: 39 minutes spent on chart review, discussion with nursing staff, consultants, updating family and interview/physical exam; more than 50% of that time was spent in counseling and/or coordination of care.    Laren Orama J [place] DO [facility] Available via Epic secure chat 7am-7pm After these hours, please refer to coverage provider listed on [email or web address] 02/10/2020, 2:19 PM

## 2020-02-10 NOTE — H&P (View-Only) (Signed)
ORTHOPAEDIC CONSULTATION  REQUESTING PHYSICIAN: [place] Eric J, DO  Chief Complaint:   HPI: [name] is a 79 y.o. female who complains of left hip pain.  Imaging shows Acute, displaced, angulated subcapital left femoral neck fracture.  Orthopedics was consulted for evaluation.   Pt. NPO after MN in anticipation of surgery Positive history of CAD; No history ofI, CVA, DVT, PE.  Previously ambulatory.  The patient was living at home.    Past Medical History:  Diagnosis Date  . Coronary artery disease   . Diabetes mellitus without complication [facility])    Past Surgical History:  Procedure Laterality Date  . CARDIAC CATHETERIZATION    . CORONARY ANGIOPLASTY     Social History   Socioeconomic History  . Marital status: Married    Spouse name: Not on file  . Number of children: Not on file  . Years of education: Not on file  . Highest education level: Not on file  Occupational History  . Not on file  Tobacco Use  . Smoking status: Never Smoker  . Smokeless tobacco: Never Used  Substance and Sexual Activity  . Alcohol use: Never  . Drug use: Never  . Sexual activity: Not on file  Other Topics Concern  . Not on file  Social History Narrative  . Not on file   Social Determinants of Health   Financial Resource Strain:   . Difficulty of Paying Living Expenses: Not on file  Food Insecurity:   . Worried About Charity fundraiser in the Last Year: Not on file  . Ran Out of Food in the Last Year: Not on file  Transportation Needs:   . Lack of Transportation (Medical): Not on file  . Lack of Transportation (Non-Medical): Not on file  Physical Activity:   . Days of Exercise per Week: Not on file  . Minutes of Exercise per Session: Not on file  Stress:   . Feeling of Stress : Not on file  Social Connections:   . Frequency of Communication with Friends and Family: Not on file  . Frequency of Social Gatherings with Friends and Family: Not on file  . Attends  Religious Services: Not on file  . Active Member of Clubs or Organizations: Not on file  . Attends Archivist Meetings: Not on file  . Marital Status: Not on file   Family History  Family history unknown: Yes   No Known Allergies Prior to Admission medications   Medication Sig Start Date End Date Taking? Authorizing Provider  BIOTIN PO Take 1 tablet by mouth daily.   Yes [provider]  Cholecalciferol (VITAMIN D) 50 MCG (2000 UT) tablet Take 2,000 Units by mouth daily.   Yes [provider]  clopidogrel (PLAVIX) 75 MG tablet Take 75 mg by mouth daily. 01/10/20  Yes [provider]  Coenzyme Q10 (COQ-10 PO) Take 1 tablet by mouth daily.   Yes [provider]  colchicine 0.6 MG tablet Take 0.6 mg by mouth daily as needed (for up to 3 days for gout flare).   Yes [provider]  CRANBERRY-CALCIUM PO Take 1 tablet by mouth daily.   Yes [provider]  diazepam (VALIUM) 5 MG tablet Take 5 mg by mouth 3 (three) times daily as needed. 10/05/19  Yes [provider]  febuxostat (ULORIC) 40 MG tablet Take 40 mg by mouth daily. 12/31/19  Yes [provider]  gabapentin (NEURONTIN) 300 MG capsule Take 300  mg by mouth 2 (two) times daily.  12/18/19  Yes [provider]  JANUVIA 25 MG tablet Take 25 mg by mouth daily. 12/26/19  Yes [provider]  KRILL OIL PO Take 1 capsule by mouth daily.   Yes [provider]  Methylfol-Methylcob-Acetylcyst (CEREFOLIN NAC) 6-2-600 MG TABS Take 1 tablet by mouth in the morning.   Yes [provider]  metolazone (ZAROXOLYN) 2.5 MG tablet Take 2.5 mg by mouth daily. 30 minutes before Bumetanide Mon. & Thurs.   Yes [provider]  metoprolol succinate (TOPROL-XL) 25 MG 24 hr tablet Take 25 mg by mouth at bedtime.  12/27/19  Yes [provider]  mirtazapine (REMERON) 15 MG tablet Take 15 mg by mouth at bedtime.  01/29/20  Yes [provider]  nitroGLYCERIN (NITROLINGUAL) 0.4 MG/SPRAY spray Place 1 spray under the tongue every 5 (five) minutes x 3 doses as needed for chest pain.   Yes [provider]  pramipexole (MIRAPEX) 0.25 MG tablet Take 0.75 mg by mouth at bedtime.  12/26/19  Yes [provider]  Resveratrol 250 MG CAPS Take 500 mg by mouth daily.   Yes [provider]  rosuvastatin (CRESTOR) 20 MG tablet Take 20 mg by mouth daily.   Yes [provider]   ECHOCARDIOGRAM COMPLETE  Result Date: 02/08/2020    ECHOCARDIOGRAM REPORT   Patient Name:   [facility] [name] Date of Exam: 02/08/2020 Medical Rec #:  [id number]        Height:       66.0 in Accession #:    [id number]       Weight:       189.0 lb Date of Birth:  1941-01-09        BSA:          1.952 m Patient Age:    28 years         BP:           107/59 mmHg Patient Gender: F                HR:           76 bpm. Exam Location:  Inpatient Procedure: 2D Echo, Cardiac Doppler and Color Doppler Indications:    Stroke 434.91 / I163.9  History:        Patient has no prior history of Echocardiogram examinations.                 CAD; Risk Factors:Diabetes.  Sonographer:    Bernadene Person RDCS Referring Phys: [id number] ERIC J [place] IMPRESSIONS  1. Left ventricular ejection fraction, by estimation, is 60 to 65%. The left ventricle has normal function. The left ventricle has no regional wall motion abnormalities. There is mild left ventricular hypertrophy. Left ventricular diastolic parameters are consistent with Grade I diastolic dysfunction (impaired relaxation).  2. Right ventricular systolic function is normal. The right ventricular size is normal. There is normal pulmonary artery systolic pressure. The estimated right ventricular systolic pressure is 31.5 mmHg.  3. The mitral valve is normal in structure. Trivial mitral valve regurgitation. No evidence of mitral stenosis.  4. The aortic valve is tricuspid. Aortic valve regurgitation is not  visualized. No aortic stenosis is present.  5. The inferior vena cava is normal in size with <50% respiratory variability, suggesting right atrial pressure of 8 mmHg. FINDINGS  Left Ventricle: Left ventricular ejection fraction, by estimation, is 60 to 65%. The left ventricle has normal function. The left  ventricle has no regional wall motion abnormalities. The left ventricular internal cavity size was normal in size. There is  mild left ventricular hypertrophy. Left ventricular diastolic parameters are consistent with Grade I diastolic dysfunction (impaired relaxation). Right Ventricle: The right ventricular size is normal. No increase in right ventricular wall thickness. Right ventricular systolic function is normal. There is normal pulmonary artery systolic pressure. The tricuspid regurgitant velocity is 2.18 m/s, and  with an assumed right atrial pressure of 8 mmHg, the estimated right ventricular systolic pressure is 27.0 mmHg. Left Atrium: Left atrial size was normal in size. Right Atrium: Right atrial size was normal in size. Pericardium: Trivial pericardial effusion is present. Mitral Valve: The mitral valve is normal in structure. Trivial mitral valve regurgitation. No evidence of mitral valve stenosis. Tricuspid Valve: The tricuspid valve is normal in structure. Tricuspid valve regurgitation is trivial. Aortic Valve: The aortic valve is tricuspid. Aortic valve regurgitation is not visualized. No aortic stenosis is present. Pulmonic Valve: The pulmonic valve was normal in structure. Pulmonic valve regurgitation is not visualized. Aorta: The aortic root is normal in size and structure. Venous: The inferior vena cava is normal in size with less than 50% respiratory variability, suggesting right atrial pressure of 8 mmHg. IAS/Shunts: No atrial level shunt detected by color flow Doppler.  LEFT VENTRICLE PLAX 2D LVIDd:         3.90 cm  Diastology LVIDs:         2.70 cm  LV e' medial:    6.48 cm/s LV PW:          0.80 cm  LV E/e' medial:  13.3 LV IVS:        1.40 cm  LV e' lateral:   7.80 cm/s LVOT diam:     1.80 cm  LV E/e' lateral: 11.1 LV SV:         75 LV SV Index:   39 LVOT Area:     2.54 cm  RIGHT VENTRICLE RV S prime:     11.10 cm/s TAPSE (M-mode): 1.7 cm LEFT ATRIUM             Index       RIGHT ATRIUM           Index LA diam:        3.00 cm 1.54 cm/m  RA Area:     16.10 cm LA Vol (A2C):   47.1 ml 24.13 ml/m RA Volume:   41.60 ml  21.31 ml/m LA Vol (A4C):   39.2 ml 20.08 ml/m LA Biplane Vol: 42.7 ml 21.87 ml/m  AORTIC VALVE LVOT Vmax:   146.00 cm/s LVOT Vmean:  102.000 cm/s LVOT VTI:    0.296 m  AORTA Ao Root diam: 3.30 cm Ao Asc diam:  3.40 cm MITRAL VALVE                TRICUSPID VALVE MV Area (PHT): 2.69 cm     TR Peak grad:   19.0 mmHg MV Decel Time: 282 msec     TR Vmax:        218.00 cm/s MV E velocity: 86.50 cm/s MV A velocity: 102.00 cm/s  SHUNTS MV E/A ratio:  0.85         Systemic VTI:  0.30 m                             Systemic Diam: 1.80 cm Marca Ancona MD Electronically signed  by Marca Ancona MD Signature Date/Time: 02/08/2020/3:32:08 PM    Final     Positive ROS: All other systems have been reviewed and were otherwise negative with the exception of those mentioned in the HPI and as above.  Objective: Labs cbc Recent Labs    02/09/20 0403 02/10/20 0246  WBC 9.1 11.5*  HGB 11.4* 11.0*  HCT 37.2 35.6*  PLT 172 156    Labs inflam No results for input(s): CRP in the last 72 hours.  Invalid input(s): ESR  Labs coag Recent Labs    02/08/20 0235  INR 1.1    Recent Labs    02/09/20 0403 02/10/20 0246  NA 138 137  K 4.7 4.2  CL 101 103  CO2 30 28  GLUCOSE 130* 127*  BUN 23 21  CREATININE 1.69* 1.46*  CALCIUM 9.0 9.0    Physical Exam: Vitals:   02/10/20 0638 02/10/20 0815  BP: (!) 154/78 117/65  Pulse: 79 82  Resp: 17 16  Temp: 98.6 F (37 C) 99.6 F (37.6 C)  SpO2: 98% 97%   General: Alert, no acute distress.  Mental status: Alert and Oriented  x3 Neurologic: Speech Clear and organized, no gross focal findings or movement disorder appreciated. Respiratory: No cyanosis, no use of accessory musculature Cardiovascular: No pedal edema GI: Abdomen is soft and non-tender, non-distended. Skin: Warm and dry.  No lesions in the area of chief complaint. Extremities: Warm and well perfused w/o edema Psychiatric: Patient is competent for consent with normal mood and affect  MUSCULOSKELETAL:  LLE       No traumatic wounds, ecchymosis, or rash             Severe TTP hip             No knee or ankle effusion             Knee stable to varus/ valgus and anterior/posterior stress             Sens DPN, SPN, TN paresthetic             Motor EHL, ext, flex, evers 5/5             DP 2+, PT 1+, 2+ edema  Other extremities are atraumatic with painless ROM and NVI.  Assessment / Plan: Principal Problem:   Hip fracture, left, closed, initial encounter [facility]) Active Problems:   CAD (coronary artery disease)   CKD (chronic kidney disease) stage 4, GFR 15-29 ml/min (HCC)   Chronic diastolic CHF (congestive heart failure) (HCC)   Type 2 diabetes mellitus with vascular disease (HCC)   Hip fracture (HCC)   Closed fracture of neck of left femur (HCC)    Plan for Operative fixation today. -NPO  -Medicine team has admitted the pt. And has cleared her for surgery -PT/OT post op -Foley okay prn for comfort  -Likely to require Rehab or SNF placement upon discharge.   The risks benefits and alternatives of the procedure were discussed with the patient including but not limited to infection, bleeding, nerve injury, the need for revision surgery, blood clots, cardiopulmonary complications, morbidity, mortality, among others.  The patient  verbalizes understanding and wishes to proceed.    Weightbearing: Bedrest.  Will amend post op. VTE prophylaxis: Hold for surgery today SCDs.  Pain control: Norco.  Minimize narcotics if able. Follow-up plan: Will  follow in acute inpatient post-op phase.  Plan for outpatient follow up with Dr. Wandra Feinstein in about 2 weeks. Contact  information:  Edmonia Lynch MD, Margy Clarks PA-C  Rachael Fee PA-C 02/10/2020 9:23 AM

## 2020-02-10 NOTE — Progress Notes (Signed)
Patient arrive from PACU at this time, VS stbale with exception of O2 sat. She is not on simple mask at 5L [place] ssat in the mid 90s. SCD and TED applied. TELE applied and confirmed. Vicodin given d/t ppain on operative hip. Site CDI. Family called and notified. No other distress voice at this time. Will continue to monitor.

## 2020-02-10 NOTE — Interval H&P Note (Signed)
History and Physical Interval Note:  02/10/2020 12:15 PM  [name]  has presented today for surgery, with the diagnosis of LEFT HIP PAIN.  The various methods of treatment have been discussed with the patient and family. After consideration of risks, benefits and other options for treatment, the patient has consented to  Procedure(s): ARTHROPLASTY BIPOLAR HIP (HEMIARTHROPLASTY) POSTERIOR LATERAL (Left) as a surgical intervention.  The patient's history has been reviewed, patient examined, no change in status, stable for surgery.  I have reviewed the patient's chart and labs.  Questions were answered to the patient's satisfaction.     [name]

## 2020-02-10 NOTE — Plan of Care (Signed)
  Problem: Education: Goal: Knowledge of General Education information will improve Description Including pain rating scale, medication(s)/side effects and non-pharmacologic comfort measures Outcome: Progressing   Problem: Health Behavior/Discharge Planning: Goal: Ability to manage health-related needs will improve Outcome: Progressing   

## 2020-02-10 NOTE — Transfer of Care (Signed)
Immediate Anesthesia Transfer of Care Note  Patient: [name]  Procedure(s) Performed: ARTHROPLASTY BIPOLAR HIP (HEMIARTHROPLASTY) POSTERIOR LATERAL (Left Hip)  Patient Location: PACU  Anesthesia Type:General  Level of Consciousness: drowsy  Airway & Oxygen Therapy: Patient Spontanous Breathing and Patient connected to face mask oxygen  Post-op Assessment: Report given to RN and Post -op Vital signs reviewed and stable  Post vital signs: Reviewed and stable  Last Vitals:  Vitals Value Taken Time  BP 93/62   Temp    Pulse 103 02/10/20 1703  Resp 27 02/10/20 1703  SpO2 92 % 02/10/20 1703  Vitals shown include unvalidated device data.  Last Pain:  Vitals:   02/10/20 1359  TempSrc:   PainSc: 8       Patients Stated Pain Goal: 3 (72/18/28 8337)  Complications: No complications documented.

## 2020-02-10 NOTE — Anesthesia Procedure Notes (Signed)
Procedure Name: Intubation Date/Time: 02/10/2020 2:10 PM Performed by: Candis Shine, CRNA Pre-anesthesia Checklist: Patient identified, Emergency Drugs available, Suction available and Patient being monitored Patient Re-evaluated:Patient Re-evaluated prior to induction Oxygen Delivery Method: Circle System Utilized Preoxygenation: Pre-oxygenation with 100% oxygen Induction Type: IV induction and Rapid sequence Laryngoscope Size: Mac and 3 Grade View: Grade I Tube type: Oral Tube size: 7.0 mm Number of attempts: 1 Airway Equipment and Method: Stylet Placement Confirmation: ETT inserted through vocal cords under direct vision,  positive ETCO2 and breath sounds checked- equal and bilateral Secured at: 22 cm Tube secured with: Tape Dental Injury: Teeth and Oropharynx as per pre-operative assessment

## 2020-02-10 NOTE — Consult Note (Signed)
ORTHOPAEDIC CONSULTATION  REQUESTING PHYSICIAN: [place] Eric J, DO  Chief Complaint:   HPI: [name] is a 79 y.o. female who complains of left hip pain.  Imaging shows Acute, displaced, angulated subcapital left femoral neck fracture.  Orthopedics was consulted for evaluation.   Pt. NPO after MN in anticipation of surgery Positive history of CAD; No history ofI, CVA, DVT, PE.  Previously ambulatory.  The patient was living at home.    Past Medical History:  Diagnosis Date  . Coronary artery disease   . Diabetes mellitus without complication [facility])    Past Surgical History:  Procedure Laterality Date  . CARDIAC CATHETERIZATION    . CORONARY ANGIOPLASTY     Social History   Socioeconomic History  . Marital status: Married    Spouse name: Not on file  . Number of children: Not on file  . Years of education: Not on file  . Highest education level: Not on file  Occupational History  . Not on file  Tobacco Use  . Smoking status: Never Smoker  . Smokeless tobacco: Never Used  Substance and Sexual Activity  . Alcohol use: Never  . Drug use: Never  . Sexual activity: Not on file  Other Topics Concern  . Not on file  Social History Narrative  . Not on file   Social Determinants of Health   Financial Resource Strain:   . Difficulty of Paying Living Expenses: Not on file  Food Insecurity:   . Worried About Charity fundraiser in the Last Year: Not on file  . Ran Out of Food in the Last Year: Not on file  Transportation Needs:   . Lack of Transportation (Medical): Not on file  . Lack of Transportation (Non-Medical): Not on file  Physical Activity:   . Days of Exercise per Week: Not on file  . Minutes of Exercise per Session: Not on file  Stress:   . Feeling of Stress : Not on file  Social Connections:   . Frequency of Communication with Friends and Family: Not on file  . Frequency of Social Gatherings with Friends and Family: Not on file  . Attends  Religious Services: Not on file  . Active Member of Clubs or Organizations: Not on file  . Attends Archivist Meetings: Not on file  . Marital Status: Not on file   Family History  Family history unknown: Yes   No Known Allergies Prior to Admission medications   Medication Sig Start Date End Date Taking? Authorizing Provider  BIOTIN PO Take 1 tablet by mouth daily.   Yes [provider]  Cholecalciferol (VITAMIN D) 50 MCG (2000 UT) tablet Take 2,000 Units by mouth daily.   Yes [provider]  clopidogrel (PLAVIX) 75 MG tablet Take 75 mg by mouth daily. 01/10/20  Yes [provider]  Coenzyme Q10 (COQ-10 PO) Take 1 tablet by mouth daily.   Yes [provider]  colchicine 0.6 MG tablet Take 0.6 mg by mouth daily as needed (for up to 3 days for gout flare).   Yes [provider]  CRANBERRY-CALCIUM PO Take 1 tablet by mouth daily.   Yes [provider]  diazepam (VALIUM) 5 MG tablet Take 5 mg by mouth 3 (three) times daily as needed. 10/05/19  Yes [provider]  febuxostat (ULORIC) 40 MG tablet Take 40 mg by mouth daily. 12/31/19  Yes [provider]  gabapentin (NEURONTIN) 300 MG capsule Take 300  mg by mouth 2 (two) times daily.  12/18/19  Yes [provider]  JANUVIA 25 MG tablet Take 25 mg by mouth daily. 12/26/19  Yes [provider]  KRILL OIL PO Take 1 capsule by mouth daily.   Yes [provider]  Methylfol-Methylcob-Acetylcyst (CEREFOLIN NAC) 6-2-600 MG TABS Take 1 tablet by mouth in the morning.   Yes [provider]  metolazone (ZAROXOLYN) 2.5 MG tablet Take 2.5 mg by mouth daily. 30 minutes before Bumetanide Mon. & Thurs.   Yes [provider]  metoprolol succinate (TOPROL-XL) 25 MG 24 hr tablet Take 25 mg by mouth at bedtime.  12/27/19  Yes [provider]  mirtazapine (REMERON) 15 MG tablet Take 15 mg by mouth at bedtime.  01/29/20  Yes [provider]  nitroGLYCERIN (NITROLINGUAL) 0.4 MG/SPRAY spray Place 1 spray under the tongue every 5 (five) minutes x 3 doses as needed for chest pain.   Yes [provider]  pramipexole (MIRAPEX) 0.25 MG tablet Take 0.75 mg by mouth at bedtime.  12/26/19  Yes [provider]  Resveratrol 250 MG CAPS Take 500 mg by mouth daily.   Yes [provider]  rosuvastatin (CRESTOR) 20 MG tablet Take 20 mg by mouth daily.   Yes [provider]   ECHOCARDIOGRAM COMPLETE  Result Date: 02/08/2020    ECHOCARDIOGRAM REPORT   Patient Name:   [name] Date of Exam: 02/08/2020 Medical Rec #:  [id number]        Height:       66.0 in Accession #:    [id number]       Weight:       189.0 lb Date of Birth:  1941-02-23        BSA:          1.952 m Patient Age:    48 years         BP:           107/59 mmHg Patient Gender: F                HR:           76 bpm. Exam Location:  Inpatient Procedure: 2D Echo, Cardiac Doppler and Color Doppler Indications:    Stroke 434.91 / I163.9  History:        Patient has no prior history of Echocardiogram examinations.                 CAD; Risk Factors:Diabetes.  Sonographer:    Bernadene Person RDCS Referring Phys: [id number] ERIC J [place] IMPRESSIONS  1. Left ventricular ejection fraction, by estimation, is 60 to 65%. The left ventricle has normal function. The left ventricle has no regional wall motion abnormalities. There is mild left ventricular hypertrophy. Left ventricular diastolic parameters are consistent with Grade I diastolic dysfunction (impaired relaxation).  2. Right ventricular systolic function is normal. The right ventricular size is normal. There is normal pulmonary artery systolic pressure. The estimated right ventricular systolic pressure is 81.4 mmHg.  3. The mitral valve is normal in structure. Trivial mitral valve regurgitation. No evidence of mitral stenosis.  4. The aortic valve is tricuspid. Aortic valve regurgitation is not  visualized. No aortic stenosis is present.  5. The inferior vena cava is normal in size with <50% respiratory variability, suggesting right atrial pressure of 8 mmHg. FINDINGS  Left Ventricle: Left ventricular ejection fraction, by estimation, is 60 to 65%. The left ventricle has normal function. The left  ventricle has no regional wall motion abnormalities. The left ventricular internal cavity size was normal in size. There is  mild left ventricular hypertrophy. Left ventricular diastolic parameters are consistent with Grade I diastolic dysfunction (impaired relaxation). Right Ventricle: The right ventricular size is normal. No increase in right ventricular wall thickness. Right ventricular systolic function is normal. There is normal pulmonary artery systolic pressure. The tricuspid regurgitant velocity is 2.18 m/s, and  with an assumed right atrial pressure of 8 mmHg, the estimated right ventricular systolic pressure is 36.1 mmHg. Left Atrium: Left atrial size was normal in size. Right Atrium: Right atrial size was normal in size. Pericardium: Trivial pericardial effusion is present. Mitral Valve: The mitral valve is normal in structure. Trivial mitral valve regurgitation. No evidence of mitral valve stenosis. Tricuspid Valve: The tricuspid valve is normal in structure. Tricuspid valve regurgitation is trivial. Aortic Valve: The aortic valve is tricuspid. Aortic valve regurgitation is not visualized. No aortic stenosis is present. Pulmonic Valve: The pulmonic valve was normal in structure. Pulmonic valve regurgitation is not visualized. Aorta: The aortic root is normal in size and structure. Venous: The inferior vena cava is normal in size with less than 50% respiratory variability, suggesting right atrial pressure of 8 mmHg. IAS/Shunts: No atrial level shunt detected by color flow Doppler.  LEFT VENTRICLE PLAX 2D LVIDd:         3.90 cm  Diastology LVIDs:         2.70 cm  LV e' medial:    6.48 cm/s LV PW:          0.80 cm  LV E/e' medial:  13.3 LV IVS:        1.40 cm  LV e' lateral:   7.80 cm/s LVOT diam:     1.80 cm  LV E/e' lateral: 11.1 LV SV:         75 LV SV Index:   39 LVOT Area:     2.54 cm  RIGHT VENTRICLE RV S prime:     11.10 cm/s TAPSE (M-mode): 1.7 cm LEFT ATRIUM             Index       RIGHT ATRIUM           Index LA diam:        3.00 cm 1.54 cm/m  RA Area:     16.10 cm LA Vol (A2C):   47.1 ml 24.13 ml/m RA Volume:   41.60 ml  21.31 ml/m LA Vol (A4C):   39.2 ml 20.08 ml/m LA Biplane Vol: 42.7 ml 21.87 ml/m  AORTIC VALVE LVOT Vmax:   146.00 cm/s LVOT Vmean:  102.000 cm/s LVOT VTI:    0.296 m  AORTA Ao Root diam: 3.30 cm Ao Asc diam:  3.40 cm MITRAL VALVE                TRICUSPID VALVE MV Area (PHT): 2.69 cm     TR Peak grad:   19.0 mmHg MV Decel Time: 282 msec     TR Vmax:        218.00 cm/s MV E velocity: 86.50 cm/s MV A velocity: 102.00 cm/s  SHUNTS MV E/A ratio:  0.85         Systemic VTI:  0.30 m                             Systemic Diam: 1.80 cm Loralie Champagne MD Electronically signed  by Marca Ancona MD Signature Date/Time: 02/08/2020/3:32:08 PM    Final     Positive ROS: All other systems have been reviewed and were otherwise negative with the exception of those mentioned in the HPI and as above.  Objective: Labs cbc Recent Labs    02/09/20 0403 02/10/20 0246  WBC 9.1 11.5*  HGB 11.4* 11.0*  HCT 37.2 35.6*  PLT 172 156    Labs inflam No results for input(s): CRP in the last 72 hours.  Invalid input(s): ESR  Labs coag Recent Labs    02/08/20 0235  INR 1.1    Recent Labs    02/09/20 0403 02/10/20 0246  NA 138 137  K 4.7 4.2  CL 101 103  CO2 30 28  GLUCOSE 130* 127*  BUN 23 21  CREATININE 1.69* 1.46*  CALCIUM 9.0 9.0    Physical Exam: Vitals:   02/10/20 0638 02/10/20 0815  BP: (!) 154/78 117/65  Pulse: 79 82  Resp: 17 16  Temp: 98.6 F (37 C) 99.6 F (37.6 C)  SpO2: 98% 97%   General: Alert, no acute distress.  Mental status: Alert and Oriented  x3 Neurologic: Speech Clear and organized, no gross focal findings or movement disorder appreciated. Respiratory: No cyanosis, no use of accessory musculature Cardiovascular: No pedal edema GI: Abdomen is soft and non-tender, non-distended. Skin: Warm and dry.  No lesions in the area of chief complaint. Extremities: Warm and well perfused w/o edema Psychiatric: Patient is competent for consent with normal mood and affect  MUSCULOSKELETAL:  LLE       No traumatic wounds, ecchymosis, or rash             Severe TTP hip             No knee or ankle effusion             Knee stable to varus/ valgus and anterior/posterior stress             Sens DPN, SPN, TN paresthetic             Motor EHL, ext, flex, evers 5/5             DP 2+, PT 1+, 2+ edema  Other extremities are atraumatic with painless ROM and NVI.  Assessment / Plan: Principal Problem:   Hip fracture, left, closed, initial encounter [facility]) Active Problems:   CAD (coronary artery disease)   CKD (chronic kidney disease) stage 4, GFR 15-29 ml/min (HCC)   Chronic diastolic CHF (congestive heart failure) (HCC)   Type 2 diabetes mellitus with vascular disease (HCC)   Hip fracture (HCC)   Closed fracture of neck of left femur (HCC)    Plan for Operative fixation today. -NPO  -Medicine team has admitted the pt. And has cleared her for surgery -PT/OT post op -Foley okay prn for comfort  -Likely to require Rehab or SNF placement upon discharge.   The risks benefits and alternatives of the procedure were discussed with the patient including but not limited to infection, bleeding, nerve injury, the need for revision surgery, blood clots, cardiopulmonary complications, morbidity, mortality, among others.  The patient  verbalizes understanding and wishes to proceed.    Weightbearing: Bedrest.  Will amend post op. VTE prophylaxis: Hold for surgery today SCDs.  Pain control: Norco.  Minimize narcotics if able. Follow-up plan: Will  follow in acute inpatient post-op phase.  Plan for outpatient follow up with Dr. Wandra Feinstein in about 2 weeks. Contact  information:  Edmonia Lynch MD, Margy Clarks PA-C  Rachael Fee PA-C 02/10/2020 9:23 AM

## 2020-02-10 NOTE — Op Note (Signed)
02/08/2020 - 02/10/2020  1:36 PM  PATIENT:  [name]   MRN: [id number]  PRE-OPERATIVE DIAGNOSIS:  LEFT HIP PAIN  POST-OPERATIVE DIAGNOSIS:  * No post-op diagnosis entered *  PROCEDURE:  Procedure(s): ARTHROPLASTY BIPOLAR HIP (HEMIARTHROPLASTY) POSTERIOR LATERAL  PREOPERATIVE INDICATIONS:  [name] is an 79 y.o. female who was admitted 02/08/2020 with a diagnosis of Hip fracture, left, closed, initial encounter [facility]) and elected for surgical management.  The risks benefits and alternatives were discussed with the patient including but not limited to the risks of nonoperative treatment, versus surgical intervention including infection, bleeding, nerve injury, periprosthetic fracture, the need for revision surgery, dislocation, leg length discrepancy, blood clots, cardiopulmonary complications, morbidity, mortality, among others, and they were willing to proceed.  Predicted outcome is good, although there will be at least a six to nine month expected recovery.   OPERATIVE REPORT     SURGEON:  Edmonia Lynch, MD    ASSISTANT:  Margy Clarks, PA-C, he was present and scrubbed throughout the case, critical for completion in a timely fashion, and for retraction, instrumentation, and closure.     ANESTHESIA:  General    COMPLICATIONS:  None.      COMPONENTS:  [employer] Acolade: Femoral stem: 4, Femoral Head:46, Neck:-4   PROCEDURE IN DETAIL: The patient was met in the holding area and identified.  The appropriate hip  was marked at the operative site. The patient was then transported to the OR and  placed under general anesthesia.  At that point, the patient was  placed in the lateral decubitus position with the operative side up and  secured to the operating room table and all bony prominences padded.     The operative lower extremity was prepped from the iliac crest to the toes.  Sterile draping was performed.  Time out was performed prior to incision.      A routine  posterolateral approach was utilized via sharp dissection  carried down to the subcutaneous tissue.  Gross bleeders were Bovie  coagulated.  The iliotibial band was identified and incised  along the length of the skin incision.  Self-retaining retractors were  inserted. I examined the bursa there was significant hematoma and edema I performed a bursectomy here.  With the hip internally rotated, the short external rotators  were identified. The piriformis was tagged with FiberWire, and the hip capsule released in a T-type fashion.  The femoral neck was exposed, and I resected the femoral neck using the appropriate jig. This was performed at approximately a thumb's breadth above the lesser trochanter.    I then exposed the deep acetabulum, cleared out any tissue including the ligamentum teres, and included the hip capsule in the FiberWire used above and below the T.    I then prepared the proximal femur using the cookie-cutter, the lateralizing reamer, and then sequentially broached.  A trial utilized, and I reduced the hip and it was found to have excellent stability with functional range of motion. The trial components were then removed.   The canal and acetabulum were thoroughly irrigated  I inserted the pressfit stem and placed the head and neck collar. The hip was reduced with appropriate force and was stable through a range of motion.   I then used a 2 mm drill bits to pass the FiberWire suture from the capsule and puriform is through the greater trochanter, and secured this. Excellent posterior capsular repair was achieved. I also closed the T in the capsule.  I  then irrigated the hip copiously again with pulse lavage, and repaired the fascia with Vicryl, followed by Vicryl for the subcutaneous tissue, Monocryl for the skin, Steri-Strips and sterile gauze. The wounds were injected. The patient was then awakened and returned to PACU in stable and satisfactory condition. There were no  complications.  POST-OP PLAN: Weight bearing as tolerated. DVT px will consist of SCD's and chemical px  Edmonia Lynch, MD Orthopedic Surgeon [phone]   02/10/2020 1:36 PM

## 2020-02-10 NOTE — Anesthesia Postprocedure Evaluation (Signed)
gg Anesthesia Post Note  Patient: [name]  Procedure(s) Performed: ARTHROPLASTY BIPOLAR HIP (HEMIARTHROPLASTY) POSTERIOR LATERAL (Left Hip)     Patient location during evaluation: PACU Anesthesia Type: General Level of consciousness: awake Pain management: pain level controlled Vital Signs Assessment: post-procedure vital signs reviewed and stable Respiratory status: spontaneous breathing Cardiovascular status: stable Postop Assessment: no apparent nausea or vomiting Anesthetic complications: no   No complications documented.  Last Vitals:  Vitals:   02/10/20 1125 02/10/20 1700  BP: (!) 141/76   Pulse: 81   Resp: 16   Temp: 37.1 C (!) 36.1 C  SpO2: 96%     Last Pain:  Vitals:   02/10/20 1700  TempSrc:   PainSc: Asleep                 Leoma Folds

## 2020-02-10 NOTE — Anesthesia Preprocedure Evaluation (Addendum)
Anesthesia Evaluation  Patient identified by MRN, date of birth, ID band Patient awake    Reviewed: Allergy & Precautions, NPO status , Patient's Chart, lab work & pertinent test results  Airway Mallampati: II  TM Distance: >3 FB     Dental   Pulmonary neg pulmonary ROS,    breath sounds clear to auscultation       Cardiovascular + CAD and +CHF   Rhythm:Regular Rate:Normal     Neuro/Psych negative neurological ROS     GI/Hepatic negative GI ROS, Neg liver ROS,   Endo/Other  diabetes  Renal/GU Renal disease     Musculoskeletal   Abdominal   Peds  Hematology   Anesthesia Other Findings   Reproductive/Obstetrics                            Anesthesia Physical Anesthesia Plan  ASA: III  Anesthesia Plan: General   Post-op Pain Management:    Induction: Intravenous  PONV Risk Score and Plan: 3 and Ondansetron and Dexamethasone  Airway Management Planned: Oral ETT  Additional Equipment:   Intra-op Plan:   Post-operative Plan: Possible Post-op intubation/ventilation  Informed Consent: I have reviewed the patients History and Physical, chart, labs and discussed the procedure including the risks, benefits and alternatives for the proposed anesthesia with the patient or authorized representative who has indicated his/her understanding and acceptance.     Dental advisory given  Plan Discussed with: CRNA and Anesthesiologist  Anesthesia Plan Comments:        Anesthesia Quick Evaluation

## 2020-02-11 ENCOUNTER — Encounter (HOSPITAL_COMMUNITY): Payer: Self-pay | Admitting: Orthopedic Surgery

## 2020-02-11 DIAGNOSIS — S72002A Fracture of unspecified part of neck of left femur, initial encounter for closed fracture: Secondary | ICD-10-CM | POA: Diagnosis not present

## 2020-02-11 DIAGNOSIS — E1159 Type 2 diabetes mellitus with other circulatory complications: Secondary | ICD-10-CM | POA: Diagnosis not present

## 2020-02-11 LAB — BASIC METABOLIC PANEL
Anion gap: 9 (ref 5–15)
BUN: 24 mg/dL — ABNORMAL HIGH (ref 8–23)
CO2: 27 mmol/L (ref 22–32)
Calcium: 9.1 mg/dL (ref 8.9–10.3)
Chloride: 101 mmol/L (ref 98–111)
Creatinine, Ser: 1.56 mg/dL — ABNORMAL HIGH (ref 0.44–1.00)
GFR, Estimated: 33 mL/min — ABNORMAL LOW (ref >60)
Glucose, Bld: 213 mg/dL — ABNORMAL HIGH (ref 70–99)
Potassium: 4.7 mmol/L (ref 3.5–5.1)
Sodium: 137 mmol/L (ref 135–145)

## 2020-02-11 LAB — GLUCOSE, CAPILLARY
Glucose-Capillary: 190 mg/dL — ABNORMAL HIGH (ref 70–99)
Glucose-Capillary: 193 mg/dL — ABNORMAL HIGH (ref 70–99)
Glucose-Capillary: 194 mg/dL — ABNORMAL HIGH (ref 70–99)
Glucose-Capillary: 210 mg/dL — ABNORMAL HIGH (ref 70–99)
Glucose-Capillary: 221 mg/dL — ABNORMAL HIGH (ref 70–99)
Glucose-Capillary: 222 mg/dL — ABNORMAL HIGH (ref 70–99)
Glucose-Capillary: 226 mg/dL — ABNORMAL HIGH (ref 70–99)
Glucose-Capillary: 245 mg/dL — ABNORMAL HIGH (ref 70–99)

## 2020-02-11 LAB — CBC
HCT: 37.3 % (ref 36.0–46.0)
Hemoglobin: 11.8 g/dL — ABNORMAL LOW (ref 12.0–15.0)
MCH: 28.7 pg (ref 26.0–34.0)
MCHC: 31.6 g/dL (ref 30.0–36.0)
MCV: 90.8 fL (ref 80.0–100.0)
Platelets: 160 10*3/uL (ref 150–400)
RBC: 4.11 MIL/uL (ref 3.87–5.11)
RDW: 14.8 % (ref 11.5–15.5)
WBC: 12.6 10*3/uL — ABNORMAL HIGH (ref 4.0–10.5)
nRBC: 0 % (ref 0.0–0.2)

## 2020-02-11 LAB — MAGNESIUM: Magnesium: 2.5 mg/dL — ABNORMAL HIGH (ref 1.7–2.4)

## 2020-02-11 MED ORDER — DIAZEPAM 5 MG PO TABS
5.0000 mg | ORAL_TABLET | Freq: Three times a day (TID) | ORAL | Status: DC | PRN
  Administered 2020-02-11 – 2020-02-15 (×6): 5 mg via ORAL
  Filled 2020-02-11 (×6): qty 1

## 2020-02-11 MED ORDER — ROSUVASTATIN CALCIUM 20 MG PO TABS
20.0000 mg | ORAL_TABLET | Freq: Every day | ORAL | Status: DC
  Administered 2020-02-11 – 2020-02-15 (×5): 20 mg via ORAL
  Filled 2020-02-11 (×5): qty 1

## 2020-02-11 MED ORDER — INSULIN ASPART 100 UNIT/ML ~~LOC~~ SOLN
0.0000 [IU] | Freq: Three times a day (TID) | SUBCUTANEOUS | Status: DC
  Administered 2020-02-11 (×2): 3 [IU] via SUBCUTANEOUS
  Administered 2020-02-11 – 2020-02-12 (×4): 2 [IU] via SUBCUTANEOUS
  Administered 2020-02-13: 1 [IU] via SUBCUTANEOUS
  Administered 2020-02-13: 2 [IU] via SUBCUTANEOUS
  Administered 2020-02-13: 1 [IU] via SUBCUTANEOUS
  Administered 2020-02-13 – 2020-02-14 (×5): 2 [IU] via SUBCUTANEOUS
  Administered 2020-02-15: 3 [IU] via SUBCUTANEOUS
  Administered 2020-02-15: 2 [IU] via SUBCUTANEOUS

## 2020-02-11 NOTE — Evaluation (Signed)
Occupational Therapy Evaluation Patient Details Name: [name] MRN: [id number] DOB: October 18, 1940 Today's Date: 02/11/2020    History of Present Illness Pt is an 79 yo female admitted after a fall after getting out of shower with resulting L femoral neck fx. Pt underwent L posterior lateral THR with posterior hip precautions.  Pt also found to have possible subacute infarct to R frontal lobe.  Neuorsurgery not sure it is a true hematoma and has no follow up. Pt did hit back of head in fall as seen with abrasion. 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 Pt has also had TKR.    Clinical Impression   Pt admitted with the above diagnosis and has the deficits listed below. Pt would benefit from cont OT to increase functional mobility and independence with adls so she can eventually return home with her husband to their home with a ramp.  Pt currently requires +2 assist and is not safe to return home with her husband but was very independent prior to this fall despite her lengthy PMH.  Pt was independent outside of driving and has goals to get back to this level.  Pt is very motivated.  Due to her charcot foot and this new fx with hit to the head, feel pt would benefit from CIR before returning home. Husband is home to care for her at all times and pt is motivated to be independent.  If pt unable to go to rehab, pt will need SNF as she is not safe to dc home at this time. Pt has all needed equipment outside of adaptive equipment pack to address the posterior hip precautions. Will continue to see with focus on mobilizing out of bed next visit.  Unable to get to stand today due to O2 sat down to 85% on 3 L of O2 and HR up to 127.       Follow Up Recommendations  CIR;Supervision/Assistance - 24 hour    Equipment Recommendations       Recommendations for Other Services Rehab consult     Precautions / Restrictions  Precautions Precautions: Fall;Posterior Hip Precaution Comments: Pt needs to review hip precautions. 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 Restrictions Weight Bearing Restrictions: Yes LLE Weight Bearing: Weight bearing as tolerated      Mobility Bed Mobility Overal bed mobility: Needs Assistance Bed Mobility: Supine to Sit;Sit to Supine     Supine to sit: Max assist;+2 for physical assistance Sit to supine: Total assist;+2 for physical assistance   General bed mobility comments: Pt initially assisted getting legs to EOB.  More assist needed to get to EOB and total assist to return to bed due to fatigued and O2 sats dropping.    Transfers Overall transfer level: Needs assistance Equipment used: Rolling walker (2 wheeled);2 person hand held assist Transfers: Sit to/from Stand Sit to Stand: Total assist;+2 physical assistance         General transfer comment: Pt able to get about 50% of way up before needing to return to bed due to pain, HR and dropping O2 sats.    Balance Overall balance assessment: Needs assistance Sitting-balance support: Feet supported;Bilateral upper extremity supported Sitting balance-Leahy Scale: Fair         Standing balance comment: Pt unable to fully stand.  ADL either performed or assessed with clinical judgement   ADL Overall ADL's : Needs assistance/impaired Eating/Feeding: Set up;Sitting;Cueing for safety   Grooming: Wash/dry hands;Wash/dry face;Oral care;Set up;Bed level Grooming Details (indicate cue type and reason): Pt unable to stand at sink at this time. EOB only due to pain. Upper Body Bathing: Set up;Bed level;Cueing for safety   Lower Body Bathing: Total assistance;+2 for physical assistance;Sitting/lateral leans;Adhering to hip precautions Lower Body Bathing Details (indicate cue type and reason): Pt with posterior hip precuations that she  does not yet understand.  Unable to get into complete standing due to pain and increased heart rate.  Upper Body Dressing : Minimal assistance;Sitting;Cueing for compensatory techniques   Lower Body Dressing: Total assistance;+2 for physical assistance;Adhering to hip precautions;Sit to/from stand;Cueing for compensatory techniques Lower Body Dressing Details (indicate cue type and reason): Pt requires further education with hip precuations and introduction to adaptive equipment.   Toilet Transfer Details (indicate cue type and reason): attempted to stand to get to [facility]. Pt with increased HR to 126 and decreased O2 sats to 85% despite increasing O2 earlier in session to 3L. Toileting- Clothing Manipulation and Hygiene: Total assistance;+2 for physical assistance;Adhering to hip precautions;Sitting/lateral lean       Functional mobility during ADLs: Maximal assistance;+2 for physical assistance General ADL Comments: Pt unable to stand or complete any LE adls or toileting adls due to pain, increased HR and decreased O2 sat rates.  Pt only has husband to assist at home.  Husband, at times, overestimates what pt is able to to.       Vision Baseline Vision/History: Wears glasses Wears Glasses: At all times Patient Visual Report: No change from baseline Vision Assessment?: No apparent visual deficits     Perception Perception Perception Tested?: No   Praxis      Pertinent Vitals/Pain Pain Assessment: Faces Faces Pain Scale: Hurts whole lot Pain Location: L hip Pain Descriptors / Indicators: Aching;Discomfort;Grimacing;Sore Pain Intervention(s): Limited activity within patient's tolerance;Monitored during session;Patient requesting pain meds-RN notified;Repositioned;Relaxation     Hand Dominance Right   Extremity/Trunk Assessment Upper Extremity Assessment Upper Extremity Assessment: LUE deficits/detail LUE Deficits / Details: ROM limited to 90 degrees flexion. Strength:  shoulder 2/5,  biceps/triceps 4/5, grip 4/5 LUE: Unable to fully assess due to pain LUE Sensation: WNL LUE Coordination: decreased gross motor   Lower Extremity Assessment Lower Extremity Assessment: Defer to PT evaluation   Cervical / Trunk Assessment Cervical / Trunk Assessment: Normal   Communication Communication Communication: No difficulties   Cognition Arousal/Alertness: Awake/alert Behavior During Therapy: Anxious Overall Cognitive Status: Within Functional Limits for tasks assessed                                 General Comments: Pt may have some minor cognitive deficits. Pt very anxious during eval but seemed to be accurate with STM and safety questioning.   General Comments  Pt with incision on L hip and abrasion on posterior head from fall.  Pt most limited by pain, HR and O2 sats.  Feel pt is not safe to return straight home and will need some form of rehab.    Exercises     Shoulder Instructions      Home Living Family/patient expects to be discharged to:: Inpatient rehab Living Arrangements: Spouse/significant other  Prior Functioning/Environment Level of Independence: Independent with assistive device(s)        Comments: Pt could not drive but was able to do most other adls other than cooking bc her husband does this.        OT Problem List: Decreased strength;Decreased range of motion;Decreased activity tolerance;Impaired balance (sitting and/or standing);Decreased knowledge of use of DME or AE;Decreased knowledge of precautions;Impaired sensation;Impaired UE functional use;Pain;Increased edema      OT Treatment/Interventions: Self-care/ADL training;Therapeutic activities;DME and/or AE instruction;Balance training    OT Goals(Current goals can be found in the care plan section) Acute Rehab OT Goals Patient Stated Goal: to be able to use the commode and not a bedpain OT Goal Formulation: With  patient Time For Goal Achievement: 02/25/20 Potential to Achieve Goals: Good ADL Goals Pt Will Perform Grooming: with min assist;standing Pt Will Perform Lower Body Bathing: with min assist;with adaptive equipment;sit to/from stand Pt Will Perform Lower Body Dressing: with min assist;with adaptive equipment;sit to/from stand Pt Will Transfer to Toilet: with min assist;stand pivot transfer;bedside commode  OT Frequency: Min 3X/week   Barriers to D/C: Decreased caregiver support  husband only person at home.  Pt requires a great amount of assist at this time.       Co-evaluation PT/OT/SLP Co-Evaluation/Treatment: Yes Reason for Co-Treatment: To address functional/ADL transfers;Complexity of the patient's impairments (multi-system involvement) PT goals addressed during session: Mobility/safety with mobility OT goals addressed during session: ADL's and self-care      AM-PAC OT "6 Clicks" Daily Activity     Outcome Measure Help from another person eating meals?: None Help from another person taking care of personal grooming?: None Help from another person toileting, which includes using toliet, bedpan, or urinal?: Total Help from another person bathing (including washing, rinsing, drying)?: A Lot Help from another person to put on and taking off regular upper body clothing?: A Lot Help from another person to put on and taking off regular lower body clothing?: A Lot 6 Click Score: 15   End of Session Equipment Utilized During Treatment: Oxygen;Gait belt Nurse Communication: Mobility status  Activity Tolerance: Patient limited by pain Patient left: in bed;with call bell/phone within reach;with bed alarm set;with family/visitor present  OT Visit Diagnosis: Unsteadiness on feet (R26.81);Other abnormalities of gait and mobility (R26.89);Pain;Muscle weakness (generalized) (M62.81) Pain - Right/Left: Left Pain - part of body: Hip                Time: 7939-0300 OT Time Calculation  (min): 58 min Charges:  OT General Charges $OT Visit: 1 Visit OT Evaluation $OT Eval Moderate Complexity: 1 Mod OT Treatments $Self Care/Home Management : 8-22 mins  Glenford Peers 02/11/2020, 11:05 AM

## 2020-02-11 NOTE — Progress Notes (Signed)
Verified with Ellison Carwin regarding 2nd unit of blood during round. Since pt with hx CHF and H/H at 11.8/37.3, hold 2nd unit of blood at this time.

## 2020-02-11 NOTE — Evaluation (Signed)
Physical Therapy Evaluation Patient Details Name: [name] MRN: [id number]  DOB: 1941/03/22 Today's Date: 02/11/2020   History of Present Illness  Pt is an 79 yo female admitted after a fall after getting out of shower with resulting L femoral neck fx. Pt underwent L posterior lateral THR with posterior hip precautions.  Pt also found to have possible subacute infarct to R frontal lobe.  Neuorsurgery not sure it is a true hematoma and has no follow up. Pt did hit back of head in fall as seen with abrasion. 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 Pt has also had TKR.   Clinical Impression  Pt in bed upon arrival of PT, agreeable to evaluation at this time. Prior to admission the pt was independent with ADLs, reports ambulation in the home with use of cane or furniture for support. The pt now presents with limitations in functional mobility, strength, power, and stability due to above dx, and will continue to benefit from skilled PT to address these deficits. The pt's mobility was further limited by significant anxiety with all movement which increased HR to 125 with static sitting, and SpO2 drop to low of 84% requiring increase from 3L O2 to 5L O2 and prolonged guided breathing to recover. The pt is highly motivated, but was limited by pain and strength today, able to complete only bed mobility and partial stand despite totalA of 2 and use of RW. The pt will continue to benefit from skilled PT acutely and following d/c to facilitate return to prior level of mobility and independence.      Follow Up Recommendations CIR    Equipment Recommendations   (defer to post acute)    Recommendations for Other Services Rehab consult     Precautions / Restrictions Precautions Precautions: Fall;Posterior Hip Precaution Comments: Pt needs to review hip precautions. Required Braces or Orthoses: Other Brace Other Brace: Pt has foot  braces due to charcot. Restrictions Weight Bearing Restrictions: Yes LLE Weight Bearing: Weight bearing as tolerated Other Position/Activity Restrictions: posterior hip precautions      Mobility  Bed Mobility Overal bed mobility: Needs Assistance Bed Mobility: Supine to Sit;Sit to Supine     Supine to sit: Max assist;+2 for physical assistance Sit to supine: Total assist;+2 for physical assistance   General bed mobility comments: Pt initially assisted getting legs to EOB.  More assist needed to get to EOB and total assist to return to bed due to fatigued and O2 sats dropping.    Transfers Overall transfer level: Needs assistance Equipment used: Rolling walker (2 wheeled);2 person hand held assist Transfers: Sit to/from Stand Sit to Stand: Total assist;+2 physical assistance         General transfer comment: Pt able to get about 50% of way up before needing to return to bed due to pain, HR and dropping O2 sats. blocking to R LE and strong assist to steady RW  Ambulation/Gait             General Gait Details: unsafe at this time due to pt inability to achieve complete stand     Balance Overall balance assessment: Needs assistance Sitting-balance support: Feet supported;Bilateral upper extremity supported Sitting balance-Leahy Scale: Fair       Standing balance-Leahy Scale: Zero Standing balance comment: Pt unable to fully stand. requires totalA of 2  Pertinent Vitals/Pain Pain Assessment: Faces Faces Pain Scale: Hurts whole lot Pain Location: L hip Pain Descriptors / Indicators: Aching;Discomfort;Grimacing;Sore Pain Intervention(s): Limited activity within patient's tolerance;Monitored during session;Patient requesting pain meds-RN notified;Relaxation;Repositioned    Home Living Family/patient expects to be discharged to:: Inpatient rehab Living Arrangements: Spouse/significant other               Additional  Comments: Pt lives in ones story home with 3 steps to enter, has both tub-shower and walk in shower with built-in seats and grab bars. Has a RW and Cane    Prior Function Level of Independence: Independent with assistive device(s) (pt reports furniture walking or intermittent use of cane)         Comments: Pt could not drive but was able to do most other adls other than cooking bc her husband does this. Pt reports use of cane or furniture as RW does not fit inher house     Hand Dominance   Dominant Hand: Right    Extremity/Trunk Assessment   Upper Extremity Assessment Upper Extremity Assessment: Defer to OT evaluation LUE Deficits / Details: ROM limited to 90 degrees flexion. Strength:  shoulder 2/5, biceps/triceps 4/5, grip 4/5 LUE: Unable to fully assess due to pain LUE Sensation: WNL LUE Coordination: decreased gross motor    Lower Extremity Assessment Lower Extremity Assessment: Generalized weakness;RLE deficits/detail;LLE deficits/detail RLE Deficits / Details: generalized weakness, able to move through full ROM, charcot foot amputation RLE Sensation: decreased light touch (no feeling below ankle) LLE Deficits / Details: limited due to pain, partial ROM against gravity. limited by pain LLE: Unable to fully assess due to pain LLE Sensation: decreased light touch (no sensation below ankle)    Cervical / Trunk Assessment Cervical / Trunk Assessment: Normal  Communication   Communication: No difficulties  Cognition Arousal/Alertness: Awake/alert Behavior During Therapy: Anxious Overall Cognitive Status: Within Functional Limits for tasks assessed                                 General Comments: Pt may have some minor cognitive deficits. Pt very anxious during eval but seemed to be accurate with STM and safety questioning.      General Comments General comments (skin integrity, edema, etc.): Pt with incision on L hip and abrasion on posterior head from  fall.  Pt most limited by pain, HR and O2 sats (low of 84% with sitting EOB, O2 increasd from 3L to 5L and RN notified).  Feel pt is not safe to return straight home and will need some form of rehab.    Exercises     Assessment/Plan    PT Assessment Patient needs continued PT services  PT Problem List Decreased strength;Decreased range of motion;Decreased activity tolerance;Decreased balance;Decreased mobility;Decreased coordination;Decreased cognition;Pain;Decreased safety awareness       PT Treatment Interventions      PT Goals (Current goals can be found in the Care Plan section)  Acute Rehab PT Goals Patient Stated Goal: to be able to use the commode and not a bedpain PT Goal Formulation: With patient Time For Goal Achievement: 02/25/20 Potential to Achieve Goals: Fair    Frequency Min 5X/week   Barriers to discharge        Co-evaluation PT/OT/SLP Co-Evaluation/Treatment: Yes Reason for Co-Treatment: Necessary to address cognition/behavior during functional activity;To address functional/ADL transfers;For patient/therapist safety PT goals addressed during session: Mobility/safety with mobility;Balance;Proper use of DME;Strengthening/ROM OT goals addressed during  session: ADL's and self-care       AM-PAC PT "6 Clicks" Mobility  Outcome Measure Help needed turning from your back to your side while in a flat bed without using bedrails?: A Lot Help needed moving from lying on your back to sitting on the side of a flat bed without using bedrails?: A Lot Help needed moving to and from a bed to a chair (including a wheelchair)?: Total Help needed standing up from a chair using your arms (e.g., wheelchair or bedside chair)?: Total Help needed to walk in hospital room?: Total Help needed climbing 3-5 steps with a railing? : Total 6 Click Score: 8    End of Session Equipment Utilized During Treatment: Gait belt;Oxygen Activity Tolerance: Patient limited by pain Patient left:  in bed;with call bell/phone within reach;with bed alarm set;with family/visitor present Nurse Communication: Mobility status PT Visit Diagnosis: Muscle weakness (generalized) (M62.81);Pain;Other abnormalities of gait and mobility (R26.89) Pain - Right/Left: Left Pain - part of body: Hip    Time: 0925-1010 PT Time Calculation (min) (ACUTE ONLY): 45 min   Charges:   PT Evaluation $PT Eval Moderate Complexity: 1 Mod          Karma Ganja, PT, DPT   Acute Rehabilitation Department Pager #: [phone]  Otho Bellows 02/11/2020, 1:03 PM

## 2020-02-11 NOTE — Consult Note (Signed)
Physical Medicine and Rehabilitation Consult Reason for Consult: Altered mental status with decreased functional mobility Referring Physician: [facility]   HPI: [name] is a 79 y.o. right-handed female with history of CKD stage IV, diastolic congestive heart failure, Charcot foot, bilateral lower extremity edema, CAD with angioplasty maintained on Plavix as well as diabetes mellitus and hyperlipidemia.  Presented 02/08/2020 after mechanical fall at home trying to get out of the shower.  Denied loss of consciousness.  Admission chemistries BUN 29 creatinine 1.77, hemoglobin 13.3.  CT/MRI showed subdural hematoma overlying the anterior lateral right frontal lobe measuring up to 4 mm in thickness.  Suspected 5 mm incidental meningioma overlying the anterior left frontal lobe unchanged from previous tracings.  MRI of head and neck no intracranial large vessel occlusion.  Patient also sustained acute displaced angulated subcapital left femoral neck fracture.  Patient underwent bipolar hip arthroplasty 2020-02-10 per [name].  Weightbearing as tolerated.  Follow-up neurosurgery [name] in regards to SDH advised conservative care..  Echocardiogram with ejection fraction of 60 to 65% no wall motion abnormalities grade 1 diastolic dysfunction.  Patient was cleared to resume Plavix as prior to admission as well as low-dose aspirin 81 mg twice daily for DVT prophylaxis.  Therapy evaluations completed with recommendations of physical medicine rehab consult.  Pt lives with her husband who is 1 yrs old and does not drive at night Review of Systems  Constitutional: Negative for chills and fever.  HENT: Negative for hearing loss.   Eyes: Negative for blurred vision and double vision.  Respiratory: Negative for cough and shortness of breath.   Cardiovascular: Positive for leg swelling. Negative for chest pain and palpitations.  Gastrointestinal: Positive for constipation. Negative for heartburn,  nausea and vomiting.  Genitourinary: Negative for dysuria, flank pain and hematuria.  Musculoskeletal: Positive for joint pain and myalgias.  Skin: Negative for rash.  All other systems reviewed and are negative.  Past Medical History:  Diagnosis Date  . Coronary artery disease   . Diabetes mellitus without complication [facility])    Past Surgical History:  Procedure Laterality Date  . CARDIAC CATHETERIZATION    . CORONARY ANGIOPLASTY     Family History  Family history unknown: Yes   Social History:  reports that she has never smoked. She has never used smokeless tobacco. She reports that she does not drink alcohol and does not use drugs. Allergies: No Known Allergies Medications Prior to Admission  Medication Sig Dispense Refill  . BIOTIN PO Take 1 tablet by mouth daily.    . Cholecalciferol (VITAMIN D) 50 MCG (2000 UT) tablet Take 2,000 Units by mouth daily.    . clopidogrel (PLAVIX) 75 MG tablet Take 75 mg by mouth daily.    . Coenzyme Q10 (COQ-10 PO) Take 1 tablet by mouth daily.    . colchicine 0.6 MG tablet Take 0.6 mg by mouth daily as needed (for up to 3 days for gout flare).    . CRANBERRY-CALCIUM PO Take 1 tablet by mouth daily.    . diazepam (VALIUM) 5 MG tablet Take 5 mg by mouth 3 (three) times daily as needed.    . febuxostat (ULORIC) 40 MG tablet Take 40 mg by mouth daily.    Marland Kitchen gabapentin (NEURONTIN) 300 MG capsule Take 300 mg by mouth 2 (two) times daily.     Marland Kitchen JANUVIA 25 MG tablet Take 25 mg by mouth daily.    Marland Kitchen KRILL OIL PO Take 1 capsule by  mouth daily.    . Methylfol-Methylcob-Acetylcyst (CEREFOLIN NAC) 6-2-600 MG TABS Take 1 tablet by mouth in the morning.    . metolazone (ZAROXOLYN) 2.5 MG tablet Take 2.5 mg by mouth daily. 30 minutes before Bumetanide Mon. & Thurs.    . metoprolol succinate (TOPROL-XL) 25 MG 24 hr tablet Take 25 mg by mouth at bedtime.     . mirtazapine (REMERON) 15 MG tablet Take 15 mg by mouth at bedtime.     . nitroGLYCERIN (NITROLINGUAL)  0.4 MG/SPRAY spray Place 1 spray under the tongue every 5 (five) minutes x 3 doses as needed for chest pain.    . pramipexole (MIRAPEX) 0.25 MG tablet Take 0.75 mg by mouth at bedtime.     Marland Kitchen Resveratrol 250 MG CAPS Take 500 mg by mouth daily.    . rosuvastatin (CRESTOR) 20 MG tablet Take 20 mg by mouth daily.      Home: Home Living Family/patient expects to be discharged to:: Inpatient rehab Living Arrangements: Spouse/significant other Additional Comments: Pt lives in ones story home with 3 steps to enter, has both tub-shower and walk in shower with built-in seats and grab bars. Has a RW and Cane  Functional History: Prior Function Level of Independence: Independent with assistive device(s) (pt reports furniture walking or intermittent use of cane) Comments: Pt could not drive but was able to do most other adls other than cooking bc her husband does this. Pt reports use of cane or furniture as RW does not fit inher house Functional Status:  Mobility: Bed Mobility Overal bed mobility: Needs Assistance Bed Mobility: Supine to Sit, Sit to Supine Supine to sit: Max assist, +2 for physical assistance Sit to supine: Total assist, +2 for physical assistance General bed mobility comments: Pt initially assisted getting legs to EOB.  More assist needed to get to EOB and total assist to return to bed due to fatigued and O2 sats dropping. Transfers Overall transfer level: Needs assistance Equipment used: Rolling walker (2 wheeled), 2 person hand held assist Transfers: Sit to/from Stand Sit to Stand: Total assist, +2 physical assistance General transfer comment: Pt able to get about 50% of way up before needing to return to bed due to pain, HR and dropping O2 sats. blocking to R LE and strong assist to steady RW Ambulation/Gait General Gait Details: unsafe at this time due to pt inability to achieve complete stand    ADL: ADL Overall ADL's : Needs assistance/impaired Eating/Feeding: Set up,  Sitting, Cueing for safety Grooming: Wash/dry hands, Wash/dry face, Oral care, Set up, Bed level Grooming Details (indicate cue type and reason): Pt unable to stand at sink at this time. EOB only due to pain. Upper Body Bathing: Set up, Bed level, Cueing for safety Lower Body Bathing: Total assistance, +2 for physical assistance, Sitting/lateral leans, Adhering to hip precautions Lower Body Bathing Details (indicate cue type and reason): Pt with posterior hip precuations that she does not yet understand.  Unable to get into complete standing due to pain and increased heart rate.  Upper Body Dressing : Minimal assistance, Sitting, Cueing for compensatory techniques Lower Body Dressing: Total assistance, +2 for physical assistance, Adhering to hip precautions, Sit to/from stand, Cueing for compensatory techniques Lower Body Dressing Details (indicate cue type and reason): Pt requires further education with hip precuations and introduction to adaptive equipment. Toilet Transfer Details (indicate cue type and reason): attempted to stand to get to [facility]. Pt with increased HR to 126 and decreased O2 sats to 85%  despite increasing O2 earlier in session to 3L. Toileting- Clothing Manipulation and Hygiene: Total assistance, +2 for physical assistance, Adhering to hip precautions, Sitting/lateral lean Functional mobility during ADLs: Maximal assistance, +2 for physical assistance General ADL Comments: Pt unable to stand or complete any LE adls or toileting adls due to pain, increased HR and decreased O2 sat rates.  Pt only has husband to assist at home.  Husband, at times, overestimates what pt is able to to.    Cognition: Cognition Overall Cognitive Status: Within Functional Limits for tasks assessed Orientation Level: Oriented X4 Cognition Arousal/Alertness: Awake/alert Behavior During Therapy: Anxious Overall Cognitive Status: Within Functional Limits for tasks assessed General Comments: Pt may have  some minor cognitive deficits. Pt very anxious during eval but seemed to be accurate with STM and safety questioning.  Blood pressure (!) 141/85, pulse 87, temperature 97.8 F (36.6 C), temperature source Oral, resp. rate (!) 22, height 5\' 6"  (1.676 m), weight 89.3 kg, SpO2 92 %. Physical Exam Vitals and nursing note reviewed.  Constitutional:      Appearance: She is obese.  HENT:     Head: Normocephalic and atraumatic.  Eyes:     Extraocular Movements: Extraocular movements intact.     Conjunctiva/sclera: Conjunctivae normal.     Pupils: Pupils are equal, round, and reactive to light.  Cardiovascular:     Rate and Rhythm: Normal rate and regular rhythm.     Heart sounds: Normal heart sounds.  Pulmonary:     Effort: Pulmonary effort is normal.     Breath sounds: Normal breath sounds.  Abdominal:     General: Abdomen is flat. There is no distension.     Palpations: Abdomen is soft.  Musculoskeletal:     Cervical back: Normal range of motion.  Skin:    General: Skin is warm and dry.     Comments: Hip incision is dressed.  Appropriately tender  Neurological:     Mental Status: She is alert and oriented to person, place, and time.     Motor: Weakness present.     Coordination: Coordination normal.     Comments: Patient is alert in no acute distress oriented to person place.  Follows commands  Psychiatric:        Mood and Affect: Mood normal.        Behavior: Behavior normal.        Thought Content: Thought content normal.        Judgment: Judgment normal.   motor strength 5/5 in bilateral delt, Biceps, triceps, grip, 4/5 Right HF, KE, ADF, 0/5 Left HF KE 4- Left ankle DF (pain inhibition)  Results for orders placed or performed during the hospital encounter of 02/08/20 (from the past 24 hour(s))  Prepare RBC (crossmatch)     Status: None   Collection Time: 02/10/20  3:00 PM  Result Value Ref Range   Order Confirmation      ORDER PROCESSED BY BLOOD BANK Performed at [facility], [place] [address] [place], [place] [address]   Glucose, capillary     Status: Abnormal   Collection Time: 02/10/20  5:03 PM  Result Value Ref Range   Glucose-Capillary 141 (H) 70 - 99 mg/dL  Glucose, capillary     Status: Abnormal   Collection Time: 02/11/20 12:06 AM  Result Value Ref Range   Glucose-Capillary 245 (H) 70 - 99 mg/dL  Glucose, capillary     Status: Abnormal   Collection Time: 02/11/20  3:44 AM  Result Value Ref Range   Glucose-Capillary 221 (H) 70 - 99 mg/dL  CBC     Status: Abnormal   Collection Time: 02/11/20  6:23 AM  Result Value Ref Range   WBC 12.6 (H) 4.0 - 10.5 K/uL   RBC 4.11 3.87 - 5.11 MIL/uL   Hemoglobin 11.8 (L) 12.0 - 15.0 g/dL   HCT 37.3 36 - 46 %   MCV 90.8 80.0 - 100.0 fL   MCH 28.7 26.0 - 34.0 pg   MCHC 31.6 30.0 - 36.0 g/dL   RDW 14.8 11.5 - 15.5 %   Platelets 160 150 - 400 K/uL   nRBC 0.0 0.0 - 0.2 %  Basic metabolic panel     Status: Abnormal   Collection Time: 02/11/20  6:23 AM  Result Value Ref Range   Sodium 137 135 - 145 mmol/L   Potassium 4.7 3.5 - 5.1 mmol/L   Chloride 101 98 - 111 mmol/L   CO2 27 22 - 32 mmol/L   Glucose, Bld 213 (H) 70 - 99 mg/dL   BUN 24 (H) 8 - 23 mg/dL   Creatinine, Ser 1.56 (H) 0.44 - 1.00 mg/dL   Calcium 9.1 8.9 - 10.3 mg/dL   GFR, Estimated 33 (L) >60 mL/min   Anion gap 9 5 - 15  Magnesium     Status: Abnormal   Collection Time: 02/11/20  6:23 AM  Result Value Ref Range   Magnesium 2.5 (H) 1.7 - 2.4 mg/dL  Glucose, capillary     Status: Abnormal   Collection Time: 02/11/20  7:34 AM  Result Value Ref Range   Glucose-Capillary 194 (H) 70 - 99 mg/dL  Glucose, capillary     Status: Abnormal   Collection Time: 02/11/20 11:47 AM  Result Value Ref Range   Glucose-Capillary 193 (H) 70 - 99 mg/dL   Pelvis Portable  Result Date: 02/10/2020 CLINICAL DATA:  LEFT hip arthroplasty EXAM: PORTABLE PELVIS 1-2 VIEWS COMPARISON:  February 08, 2020 FINDINGS: Status post LEFT hip arthroplasty. Orthopedic  hardware is intact and without periprosthetic fracture or lucency. Osteopenia. No additional acute fracture or dislocation noted on limited views. LEFT-sided subcutaneous air. Foley catheter. Surgical clips project over the LEFT groin. IMPRESSION: Expected postsurgical changes status post LEFT hip arthroplasty. Electronically Signed   By: Valentino Saxon MD   On: 02/10/2020 18:50    Assessment/Plan: Diagnosis: Left Subcapital Femoral neck fracture 1. Does the need for close, 24 hr/day medical supervision in concert with the patient's rehab needs make it unreasonable for this patient to be served in a less intensive setting? Yes 2. Co-Morbidities requiring supervision/potential complications: RIght frontal SDH, hx DM with charcot foot deformites, CKD IV, d CHF, HLD 3. Due to bladder management, bowel management, safety, skin/wound care, disease management, medication administration, pain management and patient education, does the patient require 24 hr/day rehab nursing? Yes 4. Does the patient require coordinated care of a physician, rehab nurse, therapy disciplines of PT, OT, SLP to address physical and functional deficits in the context of the above medical diagnosis(es)? Yes Addressing deficits in the following areas: balance, endurance, locomotion, strength, transferring, bowel/bladder control, bathing, dressing, toileting, cognition, psychosocial support and eval for higher level cognition 5. Can the patient actively participate in an intensive therapy program of at least 3 hrs of therapy per day at least 5 days per week? Yes 6. The potential for patient to make measurable gains while on inpatient rehab is good 7. Anticipated functional outcomes upon discharge from inpatient rehab  are min assist  with PT, min assist with OT, modified independent with SLP. 8. Estimated rehab length of stay to reach the above functional goals is: 21-25d 9. Anticipated discharge destination: Home 10. Overall  Rehab/Functional Prognosis: fair  RECOMMENDATIONS: This patient's condition is appropriate for continued rehabilitative care in the following setting: CIR Patient has agreed to participate in recommended program. Yes Note that insurance prior authorization may be required for reimbursement for recommended care.  Comment: Will need to tolerate therapy from a pain perspective   Cathlyn Parsons, PA-C 02/11/2020   "I have personally performed a face to face diagnostic evaluation of this patient.  Additionally, I have reviewed and concur with the physician assistant's documentation above." [name] M.D. [facility] FAAPM&R (Neuromuscular Med) Diplomate Am Board of Electrodiagnostic Med Fellow Am Board of Interventional Pain

## 2020-02-11 NOTE — Progress Notes (Addendum)
PROGRESS NOTE    [name]  ZDG:[id number] DOB: 03/15/1941 DOA: 02/08/2020 PCP: [name], DO    Brief Narrative:  [name] is a 79 year old female with past medical history significant for CAD s/p PCI/stent, CKD stage IV (follows with Nephrology, [name]), type 2 diabetes mellitus, chronic diastolic CHF, chronic bilateral lower extremity edema who presented to the ED following fall at home as she was trying to get out of the shower.  No LOC reported.  Patient with complaint of left hip pain.  In the ED, pelvis x-Slaugh notable for acute displaced angulated left femoral neck fracture.  Patient was noted to have a contusion/abrasion to posterior scalp, head CT and C-spine notable for possible subacute infarct right frontal area.  Neurology and orthopedics were consulted.  Creatinine 1.7 with glucose 179.  Hospital service consulted for admission for acute left hip fracture and concern of CVA.   Assessment & Plan:   Principal Problem:   Hip fracture, left, closed, initial encounter [facility]) Active Problems:   CAD (coronary artery disease)   CKD (chronic kidney disease) stage 4, GFR 15-29 ml/min (HCC)   Chronic diastolic CHF (congestive heart failure) (HCC)   Type 2 diabetes mellitus with vascular disease (HCC)   Hip fracture (HCC)   Closed fracture of neck of left femur (HCC)   Left femoral neck fracture, closed Patient presenting from home following a fall in shower with associated left hip pain.  X-Coury pelvis notable for acute displaced, angulated subcapital left femoral neck fracture.  Patient underwent posterior lateral approach left hip hemiarthroplasty by [name] on 02/10/2020. --Norco 5-325mg  1-2 tabs q6hr prn moderate pain --Robaxin as needed for muscle spasms --morphine 1 mg every 2 hours prn severe pain --DVT prophylaxis with Plavix/aspirin/SCDs per Ortho --Pending PT/OT evaluation  Acute blood loss anemia Patient received 1 unit PRBC intraoperatively for  blood loss on 11/14.  --Hgb 13.3>14.6>11.4>11.0>11.8 --Continue to monitor hemoglobin daily  Acute hypoxic respiratory failure Postoperatively, patient requiring supplemental oxygen via facemask at 4 L.  Suspect anesthesia effect.  Currently on 3 L nasal cannula this morning with SPO2 98%, titrated down to 2 L nasal cannula. --Continue mental oxygen, maintain SPO2 greater than 92% --Incentive spirometry --Hopeful for mobilization today with therapy  Right frontal subdural hematoma Presenting following a fall in the bathtub in which she struck the back of her head.  Initial CT head/C-spine shows no acute intracranial injury, no acute fracture or listhesis but does note possible subacute versus remote cortical infarct precentral gyrus of right frontal lobe.  MR brain notable for thin acute 4 mm subdural hematoma right frontal lobe, no reports of ischemic CVA.  Neurosurgery was consulted, no surgical issue and no repeat scan should be obtained per [name]; and even questions if hematoma exists.  --Supportive care, continue neurochecks --Neurosurgery signed off 02/08/2020  CAD s/p PCI/stent On Plavix 75 mg p.o. daily, Crestor 20 mg p.o. daily, metoprolol succinate 12.5 mg p.o. nightly.  Follows with cardiology outpatient, [name].  Previous bare-metal stent/PCI LAD 2006. --Continue metoprolol succinate 12.5mg  daily --Resume home Plavix and Crestor  Chronic diastolic congestive heart failure --Continue metoprolol succinate 12.5 mg p.o. daily --Also on Bumex and metolazone (Mon/Thur) - will hold for now --Strict I's and O's and daily weights  CKD stage IV Follows with nephrology outpatient, [name].  Creatinine 1.70 on admission, baseline 1.6-1.9. On Bumex and metolazone outpatient. --Cr 1.70>1.69>1.46>1.56 --Continue monitor strict I's and O's and daily weights --Continue to hold diuretics  for now, until oral intake improves  Type 2 diabetes mellitus Home regimen includes Januvia  25 mg p.o. daily.  Hemoglobin A1c 8.8, poorly controlled --Hold oral hypoglycemics while inpatient --Insulin sliding scale for coverage --CBGs qAC/HS  HLD --Resume home Crestor for now  Hx Breast Ca Diagnosed in 2018, follows with [name] outpatient; last visit 12/05/2019   DVT prophylaxis: SCDs, aspirin/Plavix Code Status: Full Code Family Communication: Patient spouse updated at bedside this morning  Disposition Plan:  Status is: Inpatient  Remains inpatient appropriate because:Ongoing active pain requiring inpatient pain management, Ongoing diagnostic testing needed not appropriate for outpatient work up, Unsafe d/c plan, IV treatments appropriate due to intensity of illness or inability to take PO and Inpatient level of care appropriate due to severity of illness   Dispo: The patient is from: Home              Anticipated d/c is to: SNF              Anticipated d/c date is: 3 days              Patient currently is not medically stable to d/c.  Awaiting PT/OT evaluation   Consultants:   Orthopedics, [name]  Neurosurgery, [name] - signed off 02/08/2020  Neurology - signed off 02/08/2020  Procedures:   posterior lateral approach left hip hemiarthroplasty by [name] on 02/10/2020  Antimicrobials:   None   Subjective: Patient seen and examined at bedside, spouse present.  Overnight transferred to the progressive unit due to postoperative hypoxia requiring 4 L via mask.  Patient complaining of some mild pain across the top of her left foot this morning.  Otherwise doing well and grateful for all the assistance with hospital staff and orthopedics for repairing her hip yesterday.  Updated spouse present at bedside, also appreciative of all the care his wife is receiving at [facility].  No other complaints or concerns at this time.  Awaiting therapy evaluation. Patient  denies chest pain, no shortness breath, no abdominal pain, no fever/chills/night sweats, no  nausea/vomiting/diarrhea.  Objective: Vitals:   02/11/20 0531 02/11/20 0600 02/11/20 0640 02/11/20 0700  BP:  (!) 123/94  (!) 141/85  Pulse:  78 87   Resp:  (!) 22    Temp:    97.8 F (36.6 C)  TempSrc:    Oral  SpO2: 95% 94% 92%   Weight:      Height:        Intake/Output Summary (Last 24 hours) at 02/11/2020 1033 Last data filed at 02/11/2020 0600 Gross per 24 hour  Intake 2705 ml  Output 2550 ml  Net 155 ml   Filed Weights   02/08/20 0228 02/10/20 0500 02/11/20 0342  Weight: 85.7 kg 88.3 kg 89.3 kg    Examination:  General exam: Appears calm, uncomfortable secondary to acute left hip pain HEENT: Noted posterior scalp abrasion/contusion Respiratory system: Clear to auscultation. Respiratory effort normal.  Oxygenating well on room air Cardiovascular system: S1 & S2 heard, RRR. No JVD, murmurs, rubs, gallops or clicks. No pedal edema. Gastrointestinal system: Abdomen is nondistended, soft and nontender. No organomegaly or masses felt. Normal bowel sounds heard. Central nervous system: Alert and oriented. No focal neurological deficits. Extremities: Moves all extremities independently besides left lower extremity, LLE shortened with external rotation Skin: No rashes, lesions or ulcers Psychiatry: Judgement and insight appear normal. Mood & affect appropriate.     Data Reviewed: I have personally reviewed following labs and  imaging studies  CBC: Recent Labs  Lab 02/08/20 0235 02/08/20 0241 02/09/20 0403 02/10/20 0246 02/11/20 0623  WBC 8.1  --  9.1 11.5* 12.6*  NEUTROABS 6.4  --   --   --   --   HGB 13.3 14.6 11.4* 11.0* 11.8*  HCT 43.4 43.0 37.2 35.6* 37.3  MCV 94.1  --  94.2 92.0 90.8  PLT 217  --  172 156 151   Basic Metabolic Panel: Recent Labs  Lab 02/08/20 0235 02/08/20 0241 02/09/20 0403 02/10/20 0246 02/11/20 0623  NA 138 137 138 137 137  K 4.9 4.9 4.7 4.2 4.7  CL 99 99 101 103 101  CO2 29  --  30 28 27   GLUCOSE 179* 173* 130* 127* 213*   BUN 29* 38* 23 21 24*  CREATININE 1.77* 1.70* 1.69* 1.46* 1.56*  CALCIUM 10.0  --  9.0 9.0 9.1  MG  --   --  2.2  --  2.5*   GFR: Estimated Creatinine Clearance: 31.8 mL/min (A) (by C-G formula based on SCr of 1.56 mg/dL (H)). Liver Function Tests: Recent Labs  Lab 02/08/20 0235  AST 44*  ALT 29  ALKPHOS 104  BILITOT 0.5  PROT 7.2  ALBUMIN 3.8   No results for input(s): LIPASE, AMYLASE in the last 168 hours. No results for input(s): AMMONIA in the last 168 hours. Coagulation Profile: Recent Labs  Lab 02/08/20 0235  INR 1.1   Cardiac Enzymes: No results for input(s): CKTOTAL, CKMB, CKMBINDEX, TROPONINI in the last 168 hours. BNP (last 3 results) No results for input(s): PROBNP in the last 8760 hours. HbA1C: No results for input(s): HGBA1C in the last 72 hours. CBG: Recent Labs  Lab 02/10/20 1122 02/10/20 1703 02/11/20 0006 02/11/20 0344 02/11/20 0734  GLUCAP 106* 141* 245* 221* 194*   Lipid Profile: No results for input(s): CHOL, HDL, LDLCALC, TRIG, CHOLHDL, LDLDIRECT in the last 72 hours. Thyroid Function Tests: No results for input(s): TSH, T4TOTAL, FREET4, T3FREE, THYROIDAB in the last 72 hours. Anemia Panel: No results for input(s): VITAMINB12, FOLATE, FERRITIN, TIBC, IRON, RETICCTPCT in the last 72 hours. Sepsis Labs: No results for input(s): PROCALCITON, LATICACIDVEN in the last 168 hours.  Recent Results (from the past 240 hour(s))  Respiratory Panel by RT PCR (Flu A&B, Covid) - Nasopharyngeal Swab     Status: None   Collection Time: 02/08/20  3:42 AM   Specimen: Nasopharyngeal Swab  Result Value Ref Range Status   SARS Coronavirus 2 by RT PCR NEGATIVE NEGATIVE Final    Comment: (NOTE) SARS-CoV-2 target nucleic acids are NOT DETECTED.  The SARS-CoV-2 RNA is generally detectable in upper respiratoy specimens during the acute phase of infection. The lowest concentration of SARS-CoV-2 viral copies this assay can detect is 131 copies/mL. A negative  result does not preclude SARS-Cov-2 infection and should not be used as the sole basis for treatment or other patient management decisions. A negative result may occur with  improper specimen collection/handling, submission of specimen other than nasopharyngeal swab, presence of viral mutation(s) within the areas targeted by this assay, and inadequate number of viral copies (<131 copies/mL). A negative result must be combined with clinical observations, patient history, and epidemiological information. The expected result is Negative.  Fact Sheet for Patients:  PinkCheek.be  Fact Sheet for Healthcare Providers:  GravelBags.it  This test is no t yet approved or cleared by the [place] FDA and  has been authorized for detection and/or diagnosis of SARS-CoV-2 by FDA under  an Emergency Use Authorization (EUA). This EUA will remain  in effect (meaning this test can be used) for the duration of the COVID-19 declaration under Section 564(b)(1) of the Act, 21 U.S.C. section 360bbb-3(b)(1), unless the authorization is terminated or revoked sooner.     Influenza A by PCR NEGATIVE NEGATIVE Final   Influenza B by PCR NEGATIVE NEGATIVE Final    Comment: (NOTE) The Xpert Xpress SARS-CoV-2/FLU/RSV assay is intended as an aid in  the diagnosis of influenza from Nasopharyngeal swab specimens and  should not be used as a sole basis for treatment. Nasal washings and  aspirates are unacceptable for Xpert Xpress SARS-CoV-2/FLU/RSV  testing.  Fact Sheet for Patients: PinkCheek.be  Fact Sheet for Healthcare Providers: GravelBags.it  This test is not yet approved or cleared by the [place] FDA and  has been authorized for detection and/or diagnosis of SARS-CoV-2 by  FDA under an Emergency Use Authorization (EUA). This EUA will remain  in effect (meaning this test can be used)  for the duration of the  Covid-19 declaration under Section 564(b)(1) of the Act, 21  U.S.C. section 360bbb-3(b)(1), unless the authorization is  terminated or revoked. Performed at [facility], [place] [address] [place], [place] [address]   Surgical pcr screen     Status: None   Collection Time: 02/08/20  5:42 PM   Specimen: Nasal Mucosa; Nasal Swab  Result Value Ref Range Status   MRSA, PCR NEGATIVE NEGATIVE Final   Staphylococcus aureus NEGATIVE NEGATIVE Final    Comment: (NOTE) The Xpert SA Assay (FDA approved for NASAL specimens in patients 14 years of age and older), is one component of a comprehensive surveillance program. It is not intended to diagnose infection nor to guide or monitor treatment. Performed at [facility], [place] [address] [place], [place] [address]          Radiology Studies: Pelvis Portable  Result Date: 02/10/2020 CLINICAL DATA:  LEFT hip arthroplasty EXAM: PORTABLE PELVIS 1-2 VIEWS COMPARISON:  February 08, 2020 FINDINGS: Status post LEFT hip arthroplasty. Orthopedic hardware is intact and without periprosthetic fracture or lucency. Osteopenia. No additional acute fracture or dislocation noted on limited views. LEFT-sided subcutaneous air. Foley catheter. Surgical clips project over the LEFT groin. IMPRESSION: Expected postsurgical changes status post LEFT hip arthroplasty. Electronically Signed   By: Valentino Saxon MD   On: 02/10/2020 18:50        Scheduled Meds: . sodium chloride   Intravenous Once  . aspirin  81 mg Oral BID  . Chlorhexidine Gluconate Cloth  6 each Topical Daily  . clopidogrel  75 mg Oral Daily  . docusate sodium  100 mg Oral BID  . feeding supplement (GLUCERNA SHAKE)  237 mL Oral TID BM  . insulin aspart  0-9 Units Subcutaneous TID AC & HS  . metoprolol succinate  12.5 mg Oral Daily  . multivitamin with minerals  1 tablet Oral Daily  . naproxen  250 mg Oral BID WC   Continuous Infusions:    LOS: 3 days     Time spent: 39 minutes spent on chart review, discussion with nursing staff, consultants, updating family and interview/physical exam; more than 50% of that time was spent in counseling and/or coordination of care.    Tatem Holsonback J [place] DO [facility] Available via Epic secure chat 7am-7pm After these hours, please refer to coverage provider listed on [email or web address] 02/11/2020, 10:33 AM

## 2020-02-11 NOTE — Progress Notes (Signed)
Pt is anxious and requesting Valium. Says that she takes this at home as needed and has orders from PCP to have up to 10 mg 3x daily. Dr. [place] made aware. See new orders.   Justice Rocher, RN

## 2020-02-11 NOTE — Progress Notes (Signed)
Patient sustaining sat at 96% on simple mask. Switched to [place] at 4L at this time with sat between 91% and 94%. Patient continue to denied pain and voice that she "feel fabulous". Will continue to monitor.

## 2020-02-11 NOTE — Progress Notes (Addendum)
Patient's spouse voices that pt appears a little confused when she first woken up but snap out of it pretty quick, which does happen before surgery. RN assess pt and she answered all question appropriately and appears not confused. She continue to denied pain. Ice applied on operative hip. Will continue to monitor.   Foley d/c'ed per postop order. Patient is DTV.

## 2020-02-11 NOTE — Progress Notes (Addendum)
Patient maintains at mid 90% on 4L [place] since 0030. Please see doc flow sheet for documentation of O2 saturation. Will wean to 3L at this time.

## 2020-02-11 NOTE — TOC CAGE-AID Note (Signed)
Transition of Care [facility]) - CAGE-AID Screening   Patient Details  Name: [name] MRN: [id number] Date of Birth: 1941-01-12  Transition of Care [facility]) CM/SW Contact:    Emeterio Reeve, [place] Phone Number: 02/11/2020, 12:38 PM   Clinical Narrative:  CSW met with pt at bedside. CSW introduced self and explained role at the hospital.  Pt denies alcohol use. Pt denies substance use. Pt did not need any resources at this time.    CAGE-AID Screening:    Have You Ever Felt You Ought to Cut Down on Your Drinking or Drug Use?: No Have People Annoyed You By Critizing Your Drinking Or Drug Use?: No Have You Felt Bad Or Guilty About Your Drinking Or Drug Use?: No Have You Ever Had a Drink or Used Drugs First Thing In The Morning to Steady Your Nerves or to Get Rid of a Hangover?: No CAGE-AID Score: 0  Substance Abuse Education Offered: Yes    Blima Ledger, [place] Social Worker [phone]

## 2020-02-11 NOTE — Progress Notes (Signed)
Inpatient Rehab Admissions Coordinator Note:   Per therapy recommendations, pt was screened for CIR candidacy by Kinston Magnan, MS CCC-SLP. At this time, Pt. Appears to have functional decline and is a good candidate for CIR. Will place order for rehab consult per protocol.  Please contact me with questions.   Jena Tegeler, MS, CCC-SLP Rehab Admissions Coordinator  [phone] (celll) [phone] (office)  

## 2020-02-11 NOTE — Progress Notes (Signed)
    Subjective: Patient reports pain as mild.   Tolerating diet.  No CP, SOB.   No weakness/dizziness.  Not yet mobilized.  Objective:   VITALS:   Vitals:   02/11/20 0516 02/11/20 0531 02/11/20 0600 02/11/20 0640  BP:   (!) 123/94   Pulse:   78 87  Resp:   (!) 22   Temp:      TempSrc:      SpO2: 96% 95% 94% 92%  Weight:      Height:       CBC Latest Ref Rng & Units 02/11/2020 02/10/2020 02/09/2020  WBC 4.0 - 10.5 K/uL 12.6(H) 11.5(H) 9.1  Hemoglobin 12.0 - 15.0 g/dL 11.8(L) 11.0(L) 11.4(L)  Hematocrit 36 - 46 % 37.3 35.6(L) 37.2  Platelets 150 - 400 K/uL 160 156 172   BMP Latest Ref Rng & Units 02/11/2020 02/10/2020 02/09/2020  Glucose 70 - 99 mg/dL 213(H) 127(H) 130(H)  BUN 8 - 23 mg/dL 24(H) 21 23  Creatinine 0.44 - 1.00 mg/dL 1.56(H) 1.46(H) 1.69(H)  Sodium 135 - 145 mmol/L 137 137 138  Potassium 3.5 - 5.1 mmol/L 4.7 4.2 4.7  Chloride 98 - 111 mmol/L 101 103 101  CO2 22 - 32 mmol/L 27 28 30   Calcium 8.9 - 10.3 mg/dL 9.1 9.0 9.0   Intake/Output      11/14 0701 - 11/15 0700 11/15 0701 - 11/16 0700   P.O. 240    I.V. (mL/kg) 1450 (16.2)    Blood 315    IV Piggyback 700    Total Intake(mL/kg) 2705 (30.3)    Urine (mL/kg/hr) 1800 (0.8)    Blood 750    Total Output 2550    Net +155             Physical Exam: General: NAD.  Resting comfortably in bed.  Calm, conversant.  No increased work of breathing. MSK LLE is neurologically intact to pt. Baseline.  Sensation intact distally to baseline Intact pulses distally Dorsiflexion/Plantar flexion intact Incision: dressing C/D/I   Assessment: 1 Day Post-Op  S/P Procedure(s) (LRB): ARTHROPLASTY BIPOLAR HIP (HEMIARTHROPLASTY) POSTERIOR LATERAL (Left) by Dr. Ernesta Amble. Percell Miller on 02/10/20  Principal Problem:   Hip fracture, left, closed, initial encounter [facility]) Active Problems:   CAD (coronary artery disease)   CKD (chronic kidney disease) stage 4, GFR 15-29 ml/min (HCC)   Chronic diastolic CHF (congestive  heart failure) (HCC)   Type 2 diabetes mellitus with vascular disease (HCC)   Hip fracture (HCC)   Closed fracture of neck of left femur (HCC)  ADDITIONAL DIAGNOSIS:   Acute Blood Loss Anemia    Closed left femoral neck fracture, status post Hemiarthroplasty Doing well postop day 1 Tolerating diet and voiding Pain controlled Not yet mobilized  Plan: Acute blood loss anemia- received blood intraoperatively. H/H 11/37.3 this morning. Seems to have resolved.   Up with therapy Incentive Spirometry Apply ice PRN  Weightbearing: WBAT LLE. Posterior Hip Precautions. Insicional and dressing care: Dressings left intact until follow-up Showering: Keep dressing dry VTE prophylaxis: Okay to resume pt. Plavix when cleared by other services. SCDs, ambulation Pain control: Minimize narcotics.  Continue current regimen. Follow - up plan: 2 weeks Contact information:  Edmonia Lynch MD, Margy Clarks PA-C  Dispo: Anticipate inpatient rehab. Therapy evaluations pending.  Okay from ortho standpoint for discharge when mobilized and ready medically.  Rachael Fee, PA-C 02/11/2020, 7:51 AM

## 2020-02-11 NOTE — Progress Notes (Signed)
Pt yet to void since foley removed this am prior to this shift. Pt bladder scanned at 275 mL and Dr. [place] text-paged to notify. Orders to bladder scan again at 1800 and to I&O if greater than 350 mL.   1845: Pt bladder scanned at 326 mL. Nightshift RN and nightshift charge RN made aware to monitor pt output closely and bladder scan as needed.   Justice Rocher, RN

## 2020-02-12 ENCOUNTER — Inpatient Hospital Stay (HOSPITAL_COMMUNITY): Payer: Medicare Other

## 2020-02-12 DIAGNOSIS — E1159 Type 2 diabetes mellitus with other circulatory complications: Secondary | ICD-10-CM | POA: Diagnosis not present

## 2020-02-12 LAB — POCT I-STAT, CHEM 8
BUN: 18 mg/dL (ref 8–23)
Calcium, Ion: 0.9 mmol/L — ABNORMAL LOW (ref 1.15–1.40)
Chloride: 106 mmol/L (ref 98–111)
Creatinine, Ser: 0.8 mg/dL (ref 0.44–1.00)
Glucose, Bld: 103 mg/dL — ABNORMAL HIGH (ref 70–99)
HCT: 26 % — ABNORMAL LOW (ref 36.0–46.0)
Hemoglobin: 8.8 g/dL — ABNORMAL LOW (ref 12.0–15.0)
Potassium: 4 mmol/L (ref 3.5–5.1)
Sodium: 139 mmol/L (ref 135–145)
TCO2: 20 mmol/L — ABNORMAL LOW (ref 22–32)

## 2020-02-12 LAB — GLUCOSE, CAPILLARY
Glucose-Capillary: 180 mg/dL — ABNORMAL HIGH (ref 70–99)
Glucose-Capillary: 146 mg/dL — ABNORMAL HIGH (ref 70–99)
Glucose-Capillary: 170 mg/dL — ABNORMAL HIGH (ref 70–99)
Glucose-Capillary: 176 mg/dL — ABNORMAL HIGH (ref 70–99)
Glucose-Capillary: 175 mg/dL — ABNORMAL HIGH (ref 70–99)

## 2020-02-12 LAB — TYPE AND SCREEN
ABO/RH(D): A POS
Antibody Screen: POSITIVE
Donor AG Type: NEGATIVE
Donor AG Type: NEGATIVE
Unit division: 0
Unit division: 0

## 2020-02-12 LAB — BPAM RBC
Blood Product Expiration Date: 202112152359
Blood Product Expiration Date: 202112162359
ISSUE DATE / TIME: 202111141421
ISSUE DATE / TIME: 202111141421
Unit Type and Rh: 6200
Unit Type and Rh: 6200

## 2020-02-12 LAB — CBC
HCT: 36.7 % (ref 36.0–46.0)
Hemoglobin: 11.5 g/dL — ABNORMAL LOW (ref 12.0–15.0)
MCH: 28.3 pg (ref 26.0–34.0)
MCHC: 31.3 g/dL (ref 30.0–36.0)
MCV: 90.2 fL (ref 80.0–100.0)
Platelets: 189 10*3/uL (ref 150–400)
RBC: 4.07 MIL/uL (ref 3.87–5.11)
RDW: 14.7 % (ref 11.5–15.5)
WBC: 14.4 10*3/uL — ABNORMAL HIGH (ref 4.0–10.5)
nRBC: 0 % (ref 0.0–0.2)

## 2020-02-12 LAB — BASIC METABOLIC PANEL
Anion gap: 10 (ref 5–15)
BUN: 34 mg/dL — ABNORMAL HIGH (ref 8–23)
CO2: 26 mmol/L (ref 22–32)
Calcium: 8.9 mg/dL (ref 8.9–10.3)
Chloride: 100 mmol/L (ref 98–111)
Creatinine, Ser: 1.59 mg/dL — ABNORMAL HIGH (ref 0.44–1.00)
GFR, Estimated: 32 mL/min — ABNORMAL LOW (ref >60)
Glucose, Bld: 175 mg/dL — ABNORMAL HIGH (ref 70–99)
Potassium: 4 mmol/L (ref 3.5–5.1)
Sodium: 136 mmol/L (ref 135–145)

## 2020-02-12 MED ORDER — SODIUM CHLORIDE 0.9 % IV BOLUS
500.0000 mL | Freq: Once | INTRAVENOUS | Status: AC
  Administered 2020-02-12: 500 mL via INTRAVENOUS

## 2020-02-12 NOTE — Progress Notes (Signed)
PROGRESS NOTE    [name]  ZOX:[id number] DOB: Oct 15, 1940 DOA: 02/08/2020 PCP: [name], DO    Brief Narrative:  [name] is a 79 year old female with past medical history significant for CAD s/p PCI/stent, CKD stage IV (follows with Nephrology, [name]), type 2 diabetes mellitus, chronic diastolic CHF, chronic bilateral lower extremity edema who presented to the ED following fall at home as she was trying to get out of the shower.  No LOC reported.  Patient with complaint of left hip pain.  In the ED, pelvis x-[place] notable for acute displaced angulated left femoral neck fracture.  Patient was noted to have a contusion/abrasion to posterior scalp, head CT and C-spine notable for possible subacute infarct right frontal area.  Neurology and orthopedics were consulted.  Creatinine 1.7 with glucose 179.  Hospital service consulted for admission for acute left hip fracture and concern of CVA.   Assessment & Plan:   Principal Problem:   Hip fracture, left, closed, initial encounter [facility]) Active Problems:   CAD (coronary artery disease)   CKD (chronic kidney disease) stage 4, GFR 15-29 ml/min (HCC)   Chronic diastolic CHF (congestive heart failure) (HCC)   Type 2 diabetes mellitus with vascular disease (HCC)   Hip fracture (HCC)   Closed fracture of neck of left femur (HCC)   Left femoral neck fracture, closed Patient presenting from home following a fall in shower with associated left hip pain.  X-Dorian pelvis notable for acute displaced, angulated subcapital left femoral neck fracture.  Patient underwent posterior lateral approach left hip hemiarthroplasty by [name] on 02/10/2020. --Norco 5-325mg  1-2 tabs q6hr prn moderate pain --Robaxin as needed for muscle spasms --morphine 1 mg every 2 hours prn severe pain --DVT prophylaxis with Plavix/aspirin/SCDs per Ortho --PT/OT following, recommend CIR; pending eval  Acute blood loss anemia Patient received 1 unit PRBC  intraoperatively for blood loss on 11/14.  --Hgb 13.3>14.6>11.4>11.0>11.8>11.5 --Continue to monitor hemoglobin daily  Acute hypoxic respiratory failure Postoperatively, patient requiring supplemental oxygen via facemask at 4 L.  Suspect anesthesia effect.  Currently on 3 L nasal cannula this morning with SPO2 98%, titrated down to 2 L nasal cannula. --Continue mental oxygen, maintain SPO2 greater than 92% --Incentive spirometry --Hopeful for mobilization today with therapy  Right frontal subdural hematoma Presenting following a fall in the bathtub in which she struck the back of her head.  Initial CT head/C-spine shows no acute intracranial injury, no acute fracture or listhesis but does note possible subacute versus remote cortical infarct precentral gyrus of right frontal lobe.  MR brain notable for thin acute 4 mm subdural hematoma right frontal lobe, no reports of ischemic CVA.  Neurosurgery was consulted, no surgical issue and no repeat scan should be obtained per [name]; and even questions if hematoma exists.  --Supportive care, continue neurochecks --Neurosurgery signed off 02/08/2020  CAD s/p PCI/stent On Plavix 75 mg p.o. daily, Crestor 20 mg p.o. daily, metoprolol succinate 12.5 mg p.o. nightly.  Follows with cardiology outpatient, [name].  Previous bare-metal stent/PCI LAD 2006. --Continue metoprolol succinate 12.5mg  daily --Resumed home Plavix and Crestor  Chronic diastolic congestive heart failure --Continue metoprolol succinate 12.5 mg p.o. daily --Also on Bumex and metolazone (Mon/Thur) - will hold for now --Strict I's and O's and daily weights  CKD stage IV Follows with nephrology outpatient, [name].  Creatinine 1.70 on admission, baseline 1.6-1.9. On Bumex and metolazone outpatient. --Cr 1.70>1.69>1.46>1.56>1.59 --Continue monitor strict I's and O's and daily weights --Continue  to hold diuretics for now, until oral intake improves  Type 2 diabetes  mellitus Home regimen includes Januvia 25 mg p.o. daily.  Hemoglobin A1c 8.8, poorly controlled --Hold oral hypoglycemics while inpatient --Insulin sliding scale for coverage --CBGs qAC/HS  HLD --continue home Crestor  Hx Breast Ca Diagnosed in 2018, follows with [name] outpatient; last visit 12/05/2019   DVT prophylaxis: SCDs, aspirin/Plavix Code Status: Full Code Family Communication: No family present at bedside this morning  Disposition Plan:  Status is: Inpatient  Remains inpatient appropriate because:Ongoing active pain requiring inpatient pain management, Ongoing diagnostic testing needed not appropriate for outpatient work up, Unsafe d/c plan, IV treatments appropriate due to intensity of illness or inability to take PO and Inpatient level of care appropriate due to severity of illness   Dispo: The patient is from: Home              Anticipated d/c is to: CIR              Anticipated d/c date is: 1 day              Patient currently is not medically stable to d/c.     Consultants:   Orthopedics, [name]  Neurosurgery, [name] - signed off 02/08/2020  Neurology - signed off 02/08/2020  Procedures:   posterior lateral approach left hip hemiarthroplasty by [name] on 02/10/2020  Antimicrobials:   None   Subjective: Patient seen and examined at bedside, resting comfortably in bedside chair.  Just finished working with physical therapy.  Pain improved with pain medication prior to therapy.  No other complaints or concerns at this time. Patient denies headache, no visual changes, no chest pain, no palpitations, no shortness breath, no abdominal pain, no fever/chills/night sweats, no nausea/vomiting/diarrhea.  No acute events overnight per nursing staff.  Objective: Vitals:   02/12/20 0744 02/12/20 0751 02/12/20 0756 02/12/20 0804  BP: 129/78 97/79 132/84   Pulse: 88 (!) 102 99 95  Resp: 19 (!) 24 (!) 28 20  Temp: 97.8 F (36.6 C)     TempSrc: Oral      SpO2: 98% (!) 89% 95% 98%  Weight:      Height:        Intake/Output Summary (Last 24 hours) at 02/12/2020 1048 Last data filed at 02/12/2020 0100 Gross per 24 hour  Intake 480 ml  Output 1150 ml  Net -670 ml   Filed Weights   02/10/20 0500 02/11/20 0342 02/12/20 0500  Weight: 88.3 kg 89.3 kg 89.3 kg    Examination:  General exam: Appears calm, no acute distress HEENT: Noted posterior scalp abrasion/contusion Respiratory system: Clear to auscultation. Respiratory effort normal.  Oxygenating well on room air Cardiovascular system: S1 & S2 heard, RRR. No JVD, murmurs, rubs, gallops or clicks. No pedal edema. Gastrointestinal system: Abdomen is nondistended, soft and nontender. No organomegaly or masses felt. Normal bowel sounds heard. Central nervous system: Alert and oriented. No focal neurological deficits. Extremities: Moves all extremities independently besides left lower extremity, left lower surgical incision site noted, clean/dry/intact Skin: No rashes, lesions or ulcers Psychiatry: Judgement and insight appear normal. Mood & affect appropriate.     Data Reviewed: I have personally reviewed following labs and imaging studies  CBC: Recent Labs  Lab 02/08/20 0235 02/08/20 0235 02/08/20 0241 02/09/20 0403 02/10/20 0246 02/11/20 0623 02/12/20 0309  WBC 8.1  --   --  9.1 11.5* 12.6* 14.4*  NEUTROABS 6.4  --   --   --   --   --   --  HGB 13.3   < > 14.6 11.4* 11.0* 11.8* 11.5*  HCT 43.4   < > 43.0 37.2 35.6* 37.3 36.7  MCV 94.1  --   --  94.2 92.0 90.8 90.2  PLT 217  --   --  172 156 160 189   < > = values in this interval not displayed.   Basic Metabolic Panel: Recent Labs  Lab 02/08/20 0235 02/08/20 0235 02/08/20 0241 02/09/20 0403 02/10/20 0246 02/11/20 0623 02/12/20 0309  NA 138   < > 137 138 137 137 136  K 4.9   < > 4.9 4.7 4.2 4.7 4.0  CL 99   < > 99 101 103 101 100  CO2 29  --   --  30 28 27 26   GLUCOSE 179*   < > 173* 130* 127* 213* 175*   BUN 29*   < > 38* 23 21 24* 34*  CREATININE 1.77*   < > 1.70* 1.69* 1.46* 1.56* 1.59*  CALCIUM 10.0  --   --  9.0 9.0 9.1 8.9  MG  --   --   --  2.2  --  2.5*  --    < > = values in this interval not displayed.   GFR: Estimated Creatinine Clearance: 31.2 mL/min (A) (by C-G formula based on SCr of 1.59 mg/dL (H)). Liver Function Tests: Recent Labs  Lab 02/08/20 0235  AST 44*  ALT 29  ALKPHOS 104  BILITOT 0.5  PROT 7.2  ALBUMIN 3.8   No results for input(s): LIPASE, AMYLASE in the last 168 hours. No results for input(s): AMMONIA in the last 168 hours. Coagulation Profile: Recent Labs  Lab 02/08/20 0235  INR 1.1   Cardiac Enzymes: No results for input(s): CKTOTAL, CKMB, CKMBINDEX, TROPONINI in the last 168 hours. BNP (last 3 results) No results for input(s): PROBNP in the last 8760 hours. HbA1C: No results for input(s): HGBA1C in the last 72 hours. CBG: Recent Labs  Lab 02/11/20 1511 02/11/20 1927 02/11/20 2346 02/12/20 0328 02/12/20 0743  GLUCAP 222* 210* 226* 180* 146*   Lipid Profile: No results for input(s): CHOL, HDL, LDLCALC, TRIG, CHOLHDL, LDLDIRECT in the last 72 hours. Thyroid Function Tests: No results for input(s): TSH, T4TOTAL, FREET4, T3FREE, THYROIDAB in the last 72 hours. Anemia Panel: No results for input(s): VITAMINB12, FOLATE, FERRITIN, TIBC, IRON, RETICCTPCT in the last 72 hours. Sepsis Labs: No results for input(s): PROCALCITON, LATICACIDVEN in the last 168 hours.  Recent Results (from the past 240 hour(s))  Respiratory Panel by RT PCR (Flu A&B, Covid) - Nasopharyngeal Swab     Status: None   Collection Time: 02/08/20  3:42 AM   Specimen: Nasopharyngeal Swab  Result Value Ref Range Status   SARS Coronavirus 2 by RT PCR NEGATIVE NEGATIVE Final    Comment: (NOTE) SARS-CoV-2 target nucleic acids are NOT DETECTED.  The SARS-CoV-2 RNA is generally detectable in upper respiratoy specimens during the acute phase of infection. The  lowest concentration of SARS-CoV-2 viral copies this assay can detect is 131 copies/mL. A negative result does not preclude SARS-Cov-2 infection and should not be used as the sole basis for treatment or other patient management decisions. A negative result may occur with  improper specimen collection/handling, submission of specimen other than nasopharyngeal swab, presence of viral mutation(s) within the areas targeted by this assay, and inadequate number of viral copies (<131 copies/mL). A negative result must be combined with clinical observations, patient history, and epidemiological information. The expected result is  Negative.  Fact Sheet for Patients:  PinkCheek.be  Fact Sheet for Healthcare Providers:  GravelBags.it  This test is no t yet approved or cleared by the [place] FDA and  has been authorized for detection and/or diagnosis of SARS-CoV-2 by FDA under an Emergency Use Authorization (EUA). This EUA will remain  in effect (meaning this test can be used) for the duration of the COVID-19 declaration under Section 564(b)(1) of the Act, 21 U.S.C. section 360bbb-3(b)(1), unless the authorization is terminated or revoked sooner.     Influenza A by PCR NEGATIVE NEGATIVE Final   Influenza B by PCR NEGATIVE NEGATIVE Final    Comment: (NOTE) The Xpert Xpress SARS-CoV-2/FLU/RSV assay is intended as an aid in  the diagnosis of influenza from Nasopharyngeal swab specimens and  should not be used as a sole basis for treatment. Nasal washings and  aspirates are unacceptable for Xpert Xpress SARS-CoV-2/FLU/RSV  testing.  Fact Sheet for Patients: PinkCheek.be  Fact Sheet for Healthcare Providers: GravelBags.it  This test is not yet approved or cleared by the [place] FDA and  has been authorized for detection and/or diagnosis of SARS-CoV-2 by  FDA under  an Emergency Use Authorization (EUA). This EUA will remain  in effect (meaning this test can be used) for the duration of the  Covid-19 declaration under Section 564(b)(1) of the Act, 21  U.S.C. section 360bbb-3(b)(1), unless the authorization is  terminated or revoked. Performed at [facility], [place] [address] [place], [place] [address]   Surgical pcr screen     Status: None   Collection Time: 02/08/20  5:42 PM   Specimen: Nasal Mucosa; Nasal Swab  Result Value Ref Range Status   MRSA, PCR NEGATIVE NEGATIVE Final   Staphylococcus aureus NEGATIVE NEGATIVE Final    Comment: (NOTE) The Xpert SA Assay (FDA approved for NASAL specimens in patients 50 years of age and older), is one component of a comprehensive surveillance program. It is not intended to diagnose infection nor to guide or monitor treatment. Performed at [facility], [place] [address], [place], [place] [address]          Radiology Studies: Pelvis Portable  Result Date: 02/10/2020 CLINICAL DATA:  LEFT hip arthroplasty EXAM: PORTABLE PELVIS 1-2 VIEWS COMPARISON:  February 08, 2020 FINDINGS: Status post LEFT hip arthroplasty. Orthopedic hardware is intact and without periprosthetic fracture or lucency. Osteopenia. No additional acute fracture or dislocation noted on limited views. LEFT-sided subcutaneous air. Foley catheter. Surgical clips project over the LEFT groin. IMPRESSION: Expected postsurgical changes status post LEFT hip arthroplasty. Electronically Signed   By: Valentino Saxon MD   On: 02/10/2020 18:50        Scheduled Meds: . sodium chloride   Intravenous Once  . aspirin  81 mg Oral BID  . Chlorhexidine Gluconate Cloth  6 each Topical Daily  . clopidogrel  75 mg Oral Daily  . docusate sodium  100 mg Oral BID  . feeding supplement (GLUCERNA SHAKE)  237 mL Oral TID BM  . insulin aspart  0-9 Units Subcutaneous TID AC & HS  . metoprolol succinate  12.5 mg Oral Daily  . multivitamin with  minerals  1 tablet Oral Daily  . naproxen  250 mg Oral BID WC  . rosuvastatin  20 mg Oral Daily   Continuous Infusions:    LOS: 4 days    Time spent: 39 minutes spent on chart review, discussion with nursing staff, consultants, updating family and interview/physical exam; more  than 50% of that time was spent in counseling and/or coordination of care.    Kerron Sedano J [place] DO [facility] Available via Epic secure chat 7am-7pm After these hours, please refer to coverage provider listed on [email or web address] 02/12/2020, 10:48 AM

## 2020-02-12 NOTE — Care Management Important Message (Signed)
Important Message  Patient Details  Name: [name] MRN: [id number] Date of Birth: [birth year]   Medicare Important Message Given:  Yes     Orbie Pyo 02/12/2020, 2:59 PM

## 2020-02-12 NOTE — Progress Notes (Signed)
Inpatient Rehabilitation-Admissions Coordinator   Met with pt bedside as follow up from PM&R consult. (Please see PM&R consult note by Dr. Letta Pate). Discussed program details, expectations, ELOS, and anticipated outcomes. With her permission, I spoke to her husband about the program. He confirmed DC support but would like to speak with his daughter and granddaughter tonight before confirming their preference for CIR. AC will follow up with them tomorrow and follow for possible admit, pending bed availability.   Raechel Ache, OTR/L  Rehab Admissions Coordinator  [phone] 02/12/2020 3:28 PM

## 2020-02-12 NOTE — PMR Pre-admission (Signed)
PMR Admission Coordinator Pre-Admission Assessment  Patient: [name] is an 79 y.o., female MRN: [id number] DOB: February 18, 1941 Height: $RemoveBefo'5\' 6"'hffcoQJmLNd$  (167.6 cm) Weight: 89.4 kg              Insurance Information HMO:     PPO:      PCP:      IPA:      80/20: yes     OTHER:  PRIMARY: Medicare A and B      Policy#: [id number]      Subscriber: patient CM Name:       Phone#:      Fax#:  Pre-Cert#:       Employer:  Benefits:  Phone #: online     Name: verified eligibility online via [place] on 02/13/20 Eff. Date: Part A and B effective 07/28/2003     Deduct: $1,484      Out of Pocket Max: NA      Life Max: NA  CIR: Covered per Medicare guidelines once yearly deductible has bene met      SNF: days 1-20, 100%, days 21-100, 80% Outpatient: 80%     Co-Pay: 20% Home Health: 100%      Co-Pay:  DME: 80%     Co-Pay: 20% Providers: Pt's choice SECONDARY: [facility]      Policy#: [id number]      Phone#: [phone]  Financial Counselor:       Phone#:   The "Data Collection Information Summary" for patients in Inpatient Rehabilitation Facilities with attached "Privacy Act [place] Records" was provided and verbally reviewed with: Family  Emergency Contact Information Contact Information    Name Relation Home Work Mobile   [name] Spouse [phone]  [phone]   Lynne Leader Daughter   [phone]   [name],Kristen Daughter   [phone]     Current Medical History  Patient Admitting Diagnosis: Left hip fracture  History of Present Illness: Pt is an 59 you female with PMH of CAD with PCI/stent, CKD stage IV, type 2 DM, chronic diastolic CHF, chronic BLE edema. Pt [facility] on [facility] after a fall while getting out of the shower. Work up revealed an acute displaced angulated left femoral neck fracture and also showed a contusion/adbraion to the posterior scalp. MRI showed a thin acute 56mm subdural hematoma in the right frontal lobe. Neurosurgery was consulted with no  surgical issues currently. PT developed acute respiratory failure post operatively requiring a facemask with 4L. Pt titrated down to 1L currently. Chest x-Sabol showed mild pulmonary vascular congestion. Pt also developed acute postoperative urinary retention requiring foley catheter placed 02/14/20. Pt did have some acute blood loss anemia and received 1 unit PRBC with hemoglobin monitored daily. Pt was evaluated by therapies with recommendation for CIR. Pt is to admit to CIR on 02/15/20.    Glasgow Coma Scale Score: 15  Past Medical History  Past Medical History:  Diagnosis Date  . Coronary artery disease   . Diabetes mellitus without complication ([place])     Family History  Family history is unknown by patient.  Prior Rehab/Hospitalizations:  Has the patient had prior rehab or hospitalizations prior to admission? No  Has the patient had major surgery during 100 days prior to admission? Yes  Current Medications   Current Facility-Administered Medications:  .  0.9 %  sodium chloride infusion (Manually program via Guardrails IV Fluids), , Intravenous, Once, Margy Clarks M, PA-C .  acetaminophen (TYLENOL) tablet 650 mg, 650 mg, Oral, Q6H PRN, Rachael Fee, PA-C, 650  mg at 02/10/20 0703 .  aspirin chewable tablet 81 mg, 81 mg, Oral, BID, Rachael Fee, PA-C, 81 mg at 02/15/20 1006 .  bethanechol (URECHOLINE) tablet 25 mg, 25 mg, Oral, TID, [place] Donnamarie Poag, DO, 25 mg at 02/15/20 1003 .  bisacodyl (DULCOLAX) suppository 10 mg, 10 mg, Rectal, Daily PRN, Margy Clarks M, PA-C .  bumetanide (BUMEX) tablet 2 mg, 2 mg, Oral, Daily, [place] Donnamarie Poag, DO, 2 mg at 02/15/20 1015 .  Cerefolin NAC 6-2-600 MG TABS 1 tablet, 1 tablet, Oral, q AM, [place] Donnamarie Poag, DO, Given at 02/15/20 [phone] .  Chlorhexidine Gluconate Cloth 2 % PADS 6 each, 6 each, Topical, Daily, Rachael Fee, [place], 6 each at 02/15/20 1028 .  clopidogrel (PLAVIX) tablet 75 mg, 75 mg, Oral, Daily, Kris Mouton, RPH, 75 mg  at 02/15/20 1005 .  diazepam (VALIUM) tablet 5 mg, 5 mg, Oral, TID PRN, [place] Donnamarie Poag, DO, 5 mg at 02/14/20 1007 .  docusate sodium (COLACE) capsule 100 mg, 100 mg, Oral, BID, Margy Clarks M, PA-C, 100 mg at 02/15/20 1005 .  feeding supplement (GLUCERNA SHAKE) (GLUCERNA SHAKE) liquid 237 mL, 237 mL, Oral, TID BM, Margy Clarks M, PA-C, 237 mL at 02/15/20 1014 .  HYDROcodone-acetaminophen (NORCO/VICODIN) 5-325 MG per tablet 1-2 tablet, 1-2 tablet, Oral, Q6H PRN, Rachael Fee, PA-C, 1 tablet at 02/14/20 2139 .  insulin aspart (novoLOG) injection 0-9 Units, 0-9 Units, Subcutaneous, TID AC & HS, [place] Eric J, DO, 2 Units at 02/15/20 1158 .  menthol-cetylpyridinium (CEPACOL) lozenge 3 mg, 1 lozenge, Oral, PRN **OR** phenol (CHLORASEPTIC) mouth spray 1 spray, 1 spray, Mouth/Throat, PRN, Margy Clarks M, PA-C .  methocarbamol (ROBAXIN) tablet 500 mg, 500 mg, Oral, Q6H PRN, Margy Clarks M, PA-C, 500 mg at 02/12/20 2200 .  metoCLOPramide (REGLAN) tablet 5-10 mg, 5-10 mg, Oral, Q8H PRN **OR** metoCLOPramide (REGLAN) injection 5-10 mg, 5-10 mg, Intravenous, Q8H PRN, Margy Clarks M, PA-C .  metoprolol succinate (TOPROL-XL) 24 hr tablet 12.5 mg, 12.5 mg, Oral, Daily, Margy Clarks M, PA-C, 12.5 mg at 02/15/20 1003 .  morphine 2 MG/ML injection 1 mg, 1 mg, Intravenous, Q2H PRN, Margy Clarks M, PA-C, 1 mg at 02/10/20 1110 .  multivitamin with minerals tablet 1 tablet, 1 tablet, Oral, Daily, Margy Clarks M, PA-C, 1 tablet at 02/15/20 1006 .  naproxen (NAPROSYN) tablet 250 mg, 250 mg, Oral, BID WC, Margy Clarks M, PA-C, 250 mg at 02/15/20 4034 .  ondansetron (ZOFRAN) injection 4 mg, 4 mg, Intravenous, Q6H PRN, Margy Clarks M, PA-C .  polyethylene glycol (MIRALAX / GLYCOLAX) packet 17 g, 17 g, Oral, Daily PRN, [place] Eric J, DO .  rosuvastatin (CRESTOR) tablet 20 mg, 20 mg, Oral, Daily, [place] Donnamarie Poag, DO, 20 mg at 02/15/20 1005 .  senna-docusate (Senokot-S) tablet 1 tablet, 1  tablet, Oral, QHS PRN, Rachael Fee, PA-C  Patients Current Diet:  Diet Order            Diet Carb Modified Fluid consistency: Thin; Room service appropriate? Yes  Diet effective now                 Precautions / Restrictions Precautions Precautions: Fall, Posterior Hip Precaution Booklet Issued: No Precaution Comments: Reviewed hip precautions- needs handout. 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 Restrictions Weight Bearing Restrictions: No LLE Weight Bearing: Weight bearing as tolerated Other Position/Activity Restrictions: posterior hip precautions   Has the patient had  2 or more falls or a fall with injury in the past year?Yes  Prior Activity Level Community (5-7x/wk): pretty active PTA, using 4 pt cane. 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 Pt reports use of cane or furniture as RW does not fit inher house  Self Care: Did the patient need help bathing, dressing, using the toilet or eating?  Independent  Indoor Mobility: Did the patient need assistance with walking from room to room (with or without device)? Independent  Stairs: Did the patient need assistance with internal or external stairs (with or without device)? Independent  Functional Cognition: Did the patient need help planning regular tasks such as shopping or remembering to take medications? Needed some help  Home Assistive Devices / Equipment Home Assistive Devices/Equipment: None  Prior Device Use: Indicate devices/aids used by the patient prior to current illness, exacerbation or injury? has 4pt cane (has but does not use WC or RW)  Current Functional Level Cognition  Overall Cognitive  Status: No family/caregiver present to determine baseline cognitive functioning Orientation Level: Oriented X4 General Comments: Remains anxious with dyspnea with mobility, also unable to state hip precuations, otherwise oriented and following commands    Extremity Assessment (includes Sensation/Coordination)  Upper Extremity Assessment: Defer to OT evaluation LUE Deficits / Details: ROM limited to 90 degrees flexion. Strength:  shoulder 2/5, biceps/triceps 4/5, grip 4/5 LUE: Unable to fully assess due to pain LUE Sensation: WNL LUE Coordination: decreased gross motor  Lower Extremity Assessment: Generalized weakness, RLE deficits/detail, LLE deficits/detail RLE Deficits / Details: generalized weakness, able to move through full ROM, charcot foot amputation RLE Sensation: decreased light touch (no feeling below ankle) LLE Deficits / Details: limited due to pain, partial ROM against gravity. limited by pain LLE: Unable to fully assess due to pain LLE Sensation: decreased light touch (no sensation below ankle)    ADLs  Overall ADL's : Needs assistance/impaired Eating/Feeding: Set up, Sitting, Cueing for safety Grooming: Wash/dry hands, Wash/dry face, Oral care, Set up, Bed level Grooming Details (indicate cue type and reason): Pt unable to stand at sink at this time. EOB only due to pain. Upper Body Bathing: Set up, Bed level, Cueing for safety Lower Body Bathing: Total assistance, +2 for physical assistance, Sitting/lateral leans, Adhering to hip precautions Lower Body Bathing Details (indicate cue type and reason): Pt with posterior hip precuations that she does not yet understand.  Unable to get into complete standing due to pain and increased heart rate.  Upper Body Dressing : Minimal assistance, Sitting, Cueing for compensatory techniques Lower Body Dressing: Total assistance, +2 for physical assistance, Adhering to hip precautions, Sit to/from stand, Cueing for compensatory  techniques Lower Body Dressing Details (indicate cue type and reason): Pt requires further education with hip precuations and introduction to adaptive equipment. Toilet Transfer Details (indicate cue type and reason): attempted to stand to get to [facility]. Pt with increased HR to 126 and decreased O2 sats to 85% despite increasing O2 earlier in session to 3L. Toileting- Clothing Manipulation and Hygiene: Total assistance, +2 for physical assistance, Adhering to hip precautions, Sitting/lateral lean Functional mobility during ADLs: Moderate assistance, +2 for physical assistance, +2 for safety/equipment General ADL Comments: Pt unable to stand or complete any LE adls or  toileting adls due to pain, and dizziness, stating she just doesnt feel like herself, BP more stable in session to date    Mobility  Overal bed mobility: Needs Assistance Bed Mobility: Supine to Sit, Sit to Supine Supine to sit: Max assist, +2 for physical assistance Sit to supine: Total assist, +2 for physical assistance General bed mobility comments: Up in chair upon arrival.    Transfers  Overall transfer level: Needs assistance Equipment used: Rolling walker (2 wheeled) Transfers: Sit to/from Stand Sit to Stand: Mod assist General transfer comment: Assist to power to standing with cues for hand placement, slow to rise, cues to stand fully upright, stated dizziness but BP improved with O2 remaining stable at 1L    Ambulation / Gait / Stairs / Wheelchair Mobility  Ambulation/Gait Ambulation/Gait assistance: Editor, commissioning (Feet): 3 Feet Assistive device: Rolling walker (2 wheeled) Gait Pattern/deviations: Step-through pattern, Decreased stride length General Gait Details: pt with limited lateral stepping to bed from recliner. Reports 7/10 fatigue but also stating she would like to walk to bathroom.  Gait velocity interpretation: <1.31 ft/sec, indicative of household ambulator    Posture / Balance Dynamic Sitting  Balance Sitting balance - Comments: Limited unsupported sitting tolerance. Balance Overall balance assessment: Needs assistance Sitting-balance support: Feet supported, No upper extremity supported Sitting balance-Leahy Scale: Fair Sitting balance - Comments: Limited unsupported sitting tolerance. Standing balance support: During functional activity Standing balance-Leahy Scale: Poor Standing balance comment: Requires UE support in standing with flexed posture.    Special needs/care consideration Oxygen: 1L [place], Skin: Left hip surgical incision, Diabetic management: yes and Behavioral consideration: mild cognitive deficits at baseline (husband reports dementia).      Previous Home Environment (from acute therapy documentation) Living Arrangements: Spouse/significant other Home Care Services: No Additional Comments: Pt lives in ones story home with 3 steps to enter, has both tub-shower and walk in shower with built-in seats and grab bars. Has a RW and Cane  Discharge Living Setting Plans for Discharge Living Setting: Patient's home, Lives with (comment) (lives with spouse) Type of Home at Discharge: House Discharge Home Layout: One level Discharge Home Access: Stairs to enter, Ramped entrance Entrance Stairs-Rails: Can reach both Entrance Stairs-Number of Steps: 4 Discharge Bathroom Shower/Tub: Walk-in shower Discharge Bathroom Toilet: Standard Discharge Bathroom Accessibility: Yes How Accessible: Accessible via walker Does the patient have any problems obtaining your medications?: No  Social/Family/Support Systems Patient Roles: Spouse Contact Information: Dorene Sorrow (spouse) [phone] Anticipated Caregiver: Dorene Sorrow + grandchild Ship broker) Anticipated Caregiver's Contact Information: see above Ability/Limitations of Caregiver: light Min A Caregiver Availability: Other (Comment) (close to 24/7) Discharge Plan Discussed with Primary Caregiver: Yes (pt and husband) Is Caregiver In  Agreement with Plan?: Yes Does Caregiver/Family have Issues with Lodging/Transportation while Pt is in Rehab?: No   Goals Patient/Family Goal for Rehab: PT/OT: Min A; SLP: Mod I Expected length of stay: 21-25 days Cultural Considerations: NA Pt/Family Agrees to Admission and willing to participate: Yes Program Orientation Provided & Reviewed with Pt/Caregiver Including Roles  & Responsibilities: Yes (pt and husband)  Barriers to Discharge: Home environment access/layout, Other (comments)  Barriers to Discharge Comments: home is tight fitting for wc; pt has cognitive impairments at baseline (form of dementia per husband)   Decrease burden of Care through IP rehab admission: OtherNA   Possible need for SNF placement upon discharge:Not anticipated; Family are supportive and understand her anticipated goals and feel they can accommodate those needs.    Patient Condition: This patient's medical and  functional status has changed since the consult dated: 02/11/20 in which the Rehabilitation Physician determined and documented that the patient's condition is appropriate for intensive rehabilitative care in an inpatient rehabilitation facility. See "History of Present Illness" (above) for medical update. Functional changes are: improvement in trasnfers from Total A +2 to Mod A, and pt has now progressed from no gait to Min A 3 feet. Patient's medical and functional status update has been discussed with the Rehabilitation physician and patient remains appropriate for inpatient rehabilitation. Will admit to inpatient rehab today.  Preadmission Screen Completed By:  Raechel Ache, OT, 02/15/2020 12:13 PM ______________________________________________________________________   Discussed status with Dr. Ranell Patrick on 02/15/20 at 12:13PM and received approval for admission today.  Admission Coordinator:  Raechel Ache, time 12:13PM/Date11/19/21.

## 2020-02-12 NOTE — Progress Notes (Signed)
Physical Therapy Treatment Patient Details Name: [name] MRN: [id number] DOB: 24-Oct-1940 Today's Date: 02/12/2020    History of Present Illness Pt is an 79 yo female admitted after a fall after getting out of shower with resulting L femoral neck fx. Pt underwent L posterior lateral THR with posterior hip precautions.  Pt also found to have possible subacute infarct to R frontal lobe.  Neuorsurgery not sure it is a true hematoma and has no follow up. Pt did hit back of head in fall as seen with abrasion. 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 Pt has also had TKR.     PT Comments    Patient progressing slowly towards PT goals. Pt sitting in chair upon arrival. Session limited today due to dyspnea at rest, worsened with exertion and dizziness with standing. Requires Mod A to rise from chair with cues for technique/hand placement and cues for upright. Unable to initiate gait today due to dizziness, dyspnea and diaphoresis. Sp02 dropped to mid 80s on 2-3 L/min 02 [place] with 2-3/4 DOE. BP pre activity 102/68, BP post activity 85/65 with dizziness/diaphoresis and BP after 4 minutes sitting 90/75. Pt took more than a few minutes to recover. Reviewed in detail posterior hip precautions- will bring handout next session. Highly motivated. Will continue to follow.   Follow Up Recommendations  CIR     Equipment Recommendations  Other (comment) (defer to post acute)    Recommendations for Other Services       Precautions / Restrictions Precautions Precautions: Fall;Posterior Hip Precaution Booklet Issued: No Precaution Comments: Reviewed hip precautions- needs handout. 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 Restrictions Weight Bearing Restrictions: Yes LLE Weight Bearing: Weight bearing as tolerated    Mobility  Bed Mobility               General bed mobility  comments: Up in chair upon PT arrival.  Transfers Overall transfer level: Needs assistance Equipment used: Rolling walker (2 wheeled) Transfers: Sit to/from Stand Sit to Stand: Mod assist         General transfer comment: ASsist to power to standing with cues for hand placement, slow to rise, assisted with pad at hips/bottom. Cues for upright. 2-3/4 DOE. SP02 dropped to mid 80s on 3L/min 02 [place].  Ambulation/Gait             General Gait Details: Unable due to dyspnea, dizziness.   Stairs             Wheelchair Mobility    Modified Rankin (Stroke Patients Only)       Balance Overall balance assessment: Needs assistance Sitting-balance support: Feet supported;No upper extremity supported Sitting balance-Leahy Scale: Fair Sitting balance - Comments: Limited unsupported sitting tolerance.   Standing balance support: During functional activity Standing balance-Leahy Scale: Poor Standing balance comment: Requires UE support in standing with flexed posture.                            Cognition Arousal/Alertness: Awake/alert Behavior During Therapy: Anxious Overall Cognitive Status: No family/caregiver present to determine baseline cognitive functioning                                 General Comments: Pt may have some minor cognitive deficits  as reported. States it is "97, I mean 2022"  Anxious with dyspnea with mobility.      Exercises General Exercises - Lower Extremity Ankle Circles/Pumps: AROM;Both;10 reps;Seated Quad Sets: AROM;Both;5 reps;Seated    General Comments General comments (skin integrity, edema, etc.): SP02 dropped to mid 80s on 2-3 L/min 02 [place] with 2-3/4 DOE. BP pre activity 102/68, BP post activity 85/65 with dizziness, diaphoresis and BP after 4 minutes sitting 90/75. RR 34.      Pertinent Vitals/Pain Pain Assessment: Faces Faces Pain Scale: Hurts little more Pain Location: Lft hip Pain Descriptors /  Indicators: Aching;Discomfort;Grimacing;Sore;Guarding Pain Intervention(s): Monitored during session;Repositioned;Limited activity within patient's tolerance;Premedicated before session    Home Living                      Prior Function            PT Goals (current goals can now be found in the care plan section) Progress towards PT goals: Progressing toward goals (slowly)    Frequency    Min 5X/week      PT Plan Current plan remains appropriate    Co-evaluation              AM-PAC PT "6 Clicks" Mobility   Outcome Measure  Help needed turning from your back to your side while in a flat bed without using bedrails?: A Lot Help needed moving from lying on your back to sitting on the side of a flat bed without using bedrails?: A Lot Help needed moving to and from a bed to a chair (including a wheelchair)?: A Lot Help needed standing up from a chair using your arms (e.g., wheelchair or bedside chair)?: A Lot Help needed to walk in hospital room?: A Lot Help needed climbing 3-5 steps with a railing? : Total 6 Click Score: 11    End of Session Equipment Utilized During Treatment: Gait belt;Oxygen Activity Tolerance: Other (comment) (SOB) Patient left: in chair;with call bell/phone within reach;with chair alarm set Nurse Communication: Mobility status PT Visit Diagnosis: Muscle weakness (generalized) (M62.81);Pain;Other abnormalities of gait and mobility (R26.89) Pain - Right/Left: Left Pain - part of body: Hip     Time: 8144-8185 PT Time Calculation (min) (ACUTE ONLY): 24 min  Charges:  $Therapeutic Activity: 23-37 mins                     Marisa Severin, PT, DPT Acute Rehabilitation Services Pager [phone] Office [place] 02/12/2020, 9:24 AM

## 2020-02-12 NOTE — Progress Notes (Signed)
Subjective: Patient reports pain as mild.   Tolerating diet.  No CP, SOB.   No weakness/dizziness.  Not yet mobilized.  Objective:   VITALS:   Vitals:   02/12/20 0100 02/12/20 0305 02/12/20 0327 02/12/20 0500  BP:  132/90    Pulse:  92    Resp: 20 17    Temp:   (!) 97.5 F (36.4 C)   TempSrc:  Oral    SpO2:  96%    Weight:    89.3 kg  Height:       CBC Latest Ref Rng & Units 02/12/2020 02/11/2020 02/10/2020  WBC 4.0 - 10.5 K/uL 14.4(H) 12.6(H) 11.5(H)  Hemoglobin 12.0 - 15.0 g/dL 11.5(L) 11.8(L) 11.0(L)  Hematocrit 36 - 46 % 36.7 37.3 35.6(L)  Platelets 150 - 400 K/uL 189 160 156   BMP Latest Ref Rng & Units 02/12/2020 02/11/2020 02/10/2020  Glucose 70 - 99 mg/dL 175(H) 213(H) 127(H)  BUN 8 - 23 mg/dL 34(H) 24(H) 21  Creatinine 0.44 - 1.00 mg/dL 1.59(H) 1.56(H) 1.46(H)  Sodium 135 - 145 mmol/L 136 137 137  Potassium 3.5 - 5.1 mmol/L 4.0 4.7 4.2  Chloride 98 - 111 mmol/L 100 101 103  CO2 22 - 32 mmol/L 26 27 28   Calcium 8.9 - 10.3 mg/dL 8.9 9.1 9.0   Intake/Output      11/15 0701 - 11/16 0700 11/16 0701 - 11/17 0700   P.O. 800    I.V. (mL/kg)     Blood     IV Piggyback     Total Intake(mL/kg) 800 (9)    Urine (mL/kg/hr) 1150 (0.5)    Stool 0    Blood     Total Output 1150    Net -350             Physical Exam: General: NAD.  Resting comfortably in bed.  Calm, conversant.  No increased work of breathing. MSK LLE is neurologically intact to pt. Baseline.  Sensation intact distally to baseline Intact pulses distally Dorsiflexion/Plantar flexion intact Incision: dressing C/D/I   Assessment: 2 Days Post-Op  S/P Procedure(s) (LRB): ARTHROPLASTY BIPOLAR HIP (HEMIARTHROPLASTY) POSTERIOR LATERAL (Left) by Dr. Ernesta Amble. Percell Miller on 02/10/20  Principal Problem:   Hip fracture, left, closed, initial encounter [facility]) Active Problems:   CAD (coronary artery disease)   CKD (chronic kidney disease) stage 4, GFR 15-29 ml/min (HCC)   Chronic diastolic CHF  (congestive heart failure) (HCC)   Type 2 diabetes mellitus with vascular disease (HCC)   Hip fracture (HCC)   Closed fracture of neck of left femur (HCC)  ADDITIONAL DIAGNOSIS:   Acute Blood Loss Anemia    Closed left femoral neck fracture, status post Hemiarthroplasty Doing well postop day 2 Tolerating diet and voiding Pain controlled Not yet mobilized  Plan: Acute blood loss anemia- received blood intraoperatively. H/H stable this morning. Seems to have resolved.   Up with therapy Incentive Spirometry Apply ice PRN  Weightbearing: WBAT LLE. Posterior Hip Precautions. Insicional and dressing care: Dressings left intact until follow-up Showering: Keep dressing dry VTE prophylaxis: Okay to resume pt. Plavix when cleared by other services. SCDs, ambulation Pain control: Minimize narcotics.  Continue current regimen. Follow - up plan: 2 weeks Contact information:  Edmonia Lynch MD, Margy Clarks PA-C  Dispo: Anticipate inpatient rehab. Therapy evaluations pending.  Okay from ortho standpoint for discharge when mobilized and ready medically. Orthopedics will s/o from this point. Please call with any questions/concerns.   Rachael Fee, PA-C 02/12/2020, 7:17  AM

## 2020-02-12 NOTE — Discharge Instructions (Signed)
You may bear weight as tolerated. Keep your dressing on and dry until follow up. Take medicine to prevent blood clots as directed. Take pain medicine as needed with the goal of transitioning to over the counter medicines.  Stop this medication as soon as you are able.  INSTRUCTIONS AFTER JOINT REPLACEMENT   o Remove items at home which could result in a fall. This includes throw rugs or furniture in walking pathways o ICE to the affected joint every three hours while awake for 30 minutes at a time, for at least the first 3-5 days, and then as needed for pain and swelling.  Continue to use ice for pain and swelling. You may notice swelling that will progress down to the foot and ankle.  This is normal after surgery.  Elevate your leg when you are not up walking on it.   o Continue to use the breathing machine you got in the hospital (incentive spirometer) which will help keep your temperature down.  It is common for your temperature to cycle up and down following surgery, especially at night when you are not up moving around and exerting yourself.  The breathing machine keeps your lungs expanded and your temperature down.   DIET:  As you were doing prior to hospitalization, we recommend a well-balanced diet.  DRESSING / WOUND CARE / SHOWERING  You may shower 3 days after surgery, but keep the wounds dry during showering.  You may use an occlusive plastic wrap (Press'n Seal for example) with blue painter's tape at edges, NO SOAKING/SUBMERGING IN THE BATHTUB.  If the bandage gets wet, change with a clean dry gauze.  If the incision gets wet, pat the wound dry with a clean towel.  ACTIVITY  o Increase activity slowly as tolerated, but follow the weight bearing instructions below.   o No driving for 6 weeks or until further direction given by your physician.  You cannot drive while taking narcotics.  o No lifting or carrying greater than 10 lbs. until further directed by your surgeon. o Avoid  periods of inactivity such as sitting longer than an hour when not asleep. This helps prevent blood clots.  o You may return to work once you are authorized by your doctor.     WEIGHT BEARING   Weight bearing as tolerated with assist device (walker, cane, etc) as directed, use it as long as suggested by your surgeon or therapist, typically at least 4-6 weeks.   EXERCISES  Results after joint replacement surgery are often greatly improved when you follow the exercise, range of motion and muscle strengthening exercises prescribed by your doctor. Safety measures are also important to protect the joint from further injury. Any time any of these exercises cause you to have increased pain or swelling, decrease what you are doing until you are comfortable again and then slowly increase them. If you have problems or questions, call your caregiver or physical therapist for advice.   Rehabilitation is important following a joint replacement. After just a few days of immobilization, the muscles of the leg can become weakened and shrink (atrophy).  These exercises are designed to build up the tone and strength of the thigh and leg muscles and to improve motion. Often times heat used for twenty to thirty minutes before working out will loosen up your tissues and help with improving the range of motion but do not use heat for the first two weeks following surgery (sometimes heat can increase post-operative swelling).  These exercises can be done on a training (exercise) mat, on the floor, on a table or on a bed. Use whatever works the best and is most comfortable for you.    Use music or television while you are exercising so that the exercises are a pleasant break in your day. This will make your life better with the exercises acting as a break in your routine that you can look forward to.   Perform all exercises about fifteen times, three times per day or as directed.  You should exercise both the operative leg  and the other leg as well.  Exercises include:   . Quad Sets - Tighten up the muscle on the front of the thigh (Quad) and hold for 5-10 seconds.   . Straight Leg Raises - With your knee straight (if you were given a brace, keep it on), lift the leg to 60 degrees, hold for 3 seconds, and slowly lower the leg.  Perform this exercise against resistance later as your leg gets stronger.  . Leg Slides: Lying on your back, slowly slide your foot toward your buttocks, bending your knee up off the floor (only go as far as is comfortable). Then slowly slide your foot back down until your leg is flat on the floor again.  . Angel Wings: Lying on your back spread your legs to the side as far apart as you can without causing discomfort.  . Hamstring Strength:  Lying on your back, push your heel against the floor with your leg straight by tightening up the muscles of your buttocks.  Repeat, but this time bend your knee to a comfortable angle, and push your heel against the floor.  You may put a pillow under the heel to make it more comfortable if necessary.   A rehabilitation program following joint replacement surgery can speed recovery and prevent re-injury in the future due to weakened muscles. Contact your doctor or a physical therapist for more information on knee rehabilitation.    CONSTIPATION  Constipation is defined medically as fewer than three stools per week and severe constipation as less than one stool per week.  Even if you have a regular bowel pattern at home, your normal regimen is likely to be disrupted due to multiple reasons following surgery.  Combination of anesthesia, postoperative narcotics, change in appetite and fluid intake all can affect your bowels.   YOU MUST use at least one of the following options; they are listed in order of increasing strength to get the job done.  They are all available over the counter, and you may need to use some, POSSIBLY even all of these options:     Drink plenty of fluids (prune juice may be helpful) and high fiber foods Colace 100 mg by mouth twice a day  Senokot for constipation as directed and as needed Dulcolax (bisacodyl), take with full glass of water  Miralax (polyethylene glycol) once or twice a day as needed.  If you have tried all these things and are unable to have a bowel movement in the first 3-4 days after surgery call either your surgeon or your primary doctor.    If you experience loose stools or diarrhea, hold the medications until you stool forms back up.  If your symptoms do not get better within 1 week or if they get worse, check with your doctor.  If you experience "the worst abdominal pain ever" or develop nausea or vomiting, please contact the office immediately for further   recommendations for treatment.   ITCHING:  If you experience itching with your medications, try taking only a single pain pill, or even half a pain pill at a time.  You can also use Benadryl over the counter for itching or also to help with sleep.   TED HOSE STOCKINGS:  Use stockings on both legs until for at least 2 weeks or as directed by physician office. They may be removed at night for sleeping.  MEDICATIONS:  See your medication summary on the "After Visit Summary" that nursing will review with you.  You may have some home medications which will be placed on hold until you complete the course of blood thinner medication.  It is important for you to complete the blood thinner medication as prescribed.  PRECAUTIONS:  If you experience chest pain or shortness of breath - call 911 immediately for transfer to the hospital emergency department.   If you develop a fever greater that 101 F, purulent drainage from wound, increased redness or drainage from wound, foul odor from the wound/dressing, or calf pain - CONTACT YOUR SURGEON.                                                   FOLLOW-UP APPOINTMENTS:  If you do not already have a post-op  appointment, please call the office for an appointment to be seen by your surgeon.  Guidelines for how soon to be seen are listed in your "After Visit Summary", but are typically between 1-4 weeks after surgery.  OTHER INSTRUCTIONS:  Dental Antibiotics:  In most cases prophylactic antibiotics for Dental procdeures after total joint surgery are not necessary.  Exceptions are as follows:  1. History of prior total joint infection  2. Severely immunocompromised (Organ Transplant, cancer chemotherapy, Rheumatoid biologic meds such as [place])  3. Poorly controlled diabetes (A1C &gt; 8.0, blood glucose over 200)  If you have one of these conditions, contact your surgeon for an antibiotic prescription, prior to your dental procedure.   MAKE SURE YOU:  . Understand these instructions.  . Get help right away if you are not doing well or get worse.    Thank you for letting us be a part of your medical care team.  It is a privilege we respect greatly.  We hope these instructions will help you stay on track for a fast and full recovery!

## 2020-02-12 NOTE — Progress Notes (Signed)
Patient unable to void. Bladder scan performed and a total of 900 ml of urine scanned. In and out cath performed, and a total of 1100 ml of urine removed. Post In and Out bladder scan showed a total of 17ml of residual.

## 2020-02-13 DIAGNOSIS — E1159 Type 2 diabetes mellitus with other circulatory complications: Secondary | ICD-10-CM | POA: Diagnosis not present

## 2020-02-13 LAB — URINALYSIS, ROUTINE W REFLEX MICROSCOPIC
Bilirubin Urine: NEGATIVE
Glucose, UA: NEGATIVE mg/dL
Ketones, ur: NEGATIVE mg/dL
Nitrite: NEGATIVE
Protein, ur: NEGATIVE mg/dL
Specific Gravity, Urine: 1.016 (ref 1.005–1.030)
pH: 5 (ref 5.0–8.0)

## 2020-02-13 LAB — BASIC METABOLIC PANEL
Anion gap: 9 (ref 5–15)
BUN: 40 mg/dL — ABNORMAL HIGH (ref 8–23)
CO2: 24 mmol/L (ref 22–32)
Calcium: 8.8 mg/dL — ABNORMAL LOW (ref 8.9–10.3)
Chloride: 103 mmol/L (ref 98–111)
Creatinine, Ser: 1.52 mg/dL — ABNORMAL HIGH (ref 0.44–1.00)
GFR, Estimated: 34 mL/min — ABNORMAL LOW (ref >60)
Glucose, Bld: 177 mg/dL — ABNORMAL HIGH (ref 70–99)
Potassium: 4.1 mmol/L (ref 3.5–5.1)
Sodium: 136 mmol/L (ref 135–145)

## 2020-02-13 LAB — CBC
HCT: 35 % — ABNORMAL LOW (ref 36.0–46.0)
Hemoglobin: 11.1 g/dL — ABNORMAL LOW (ref 12.0–15.0)
MCH: 28.4 pg (ref 26.0–34.0)
MCHC: 31.7 g/dL (ref 30.0–36.0)
MCV: 89.5 fL (ref 80.0–100.0)
Platelets: 181 10*3/uL (ref 150–400)
RBC: 3.91 MIL/uL (ref 3.87–5.11)
RDW: 15.1 % (ref 11.5–15.5)
WBC: 12.6 10*3/uL — ABNORMAL HIGH (ref 4.0–10.5)
nRBC: 0 % (ref 0.0–0.2)

## 2020-02-13 LAB — GLUCOSE, CAPILLARY
Glucose-Capillary: 129 mg/dL — ABNORMAL HIGH (ref 70–99)
Glucose-Capillary: 175 mg/dL — ABNORMAL HIGH (ref 70–99)
Glucose-Capillary: 161 mg/dL — ABNORMAL HIGH (ref 70–99)
Glucose-Capillary: 161 mg/dL — ABNORMAL HIGH (ref 70–99)

## 2020-02-13 MED ORDER — BUMETANIDE 2 MG PO TABS: 2.0000 mg | ORAL_TABLET | Freq: Every day | ORAL | Status: DC

## 2020-02-13 MED ORDER — FUROSEMIDE 10 MG/ML IJ SOLN
40.0000 mg | Freq: Once | INTRAMUSCULAR | Status: DC
  Filled 2020-02-13: qty 4

## 2020-02-13 MED ORDER — POLYETHYLENE GLYCOL 3350 17 G PO PACK: 17.0000 g | PACK | Freq: Every day | ORAL | Status: DC | PRN

## 2020-02-13 MED ORDER — BUMETANIDE 2 MG PO TABS
2.0000 mg | ORAL_TABLET | Freq: Every day | ORAL | Status: DC
  Administered 2020-02-13 – 2020-02-15 (×3): 2 mg via ORAL
  Filled 2020-02-13 (×3): qty 1

## 2020-02-13 NOTE — Progress Notes (Signed)
Patient bladder scanned at 00:00, and a total of 481 ml of urine scanned. In and Out Cath performed, and a total of 680 ml of urine removed. Urine sample sent to lab as ordered. Post scan showed a total of 50 ml of urine residual.

## 2020-02-13 NOTE — Progress Notes (Signed)
Bladder scan performed per order. Total of 215 ml of urine scanned.

## 2020-02-13 NOTE — Progress Notes (Signed)
Inpatient Rehabilitation-Admissions Coordinator   Confirmed with pt's husband his preference for CIR. At this time I do not have a bed open for her today but will follow up tomorrow for potential admit, pending bed availability.   Raechel Ache, OTR/L  Rehab Admissions Coordinator  [phone] 02/13/2020 10:16 AM

## 2020-02-13 NOTE — Progress Notes (Signed)
PROGRESS NOTE    [name]  YTK:[id number] DOB: Mar 06, 1941 DOA: 02/08/2020 PCP: [name], DO    Brief Narrative:  [name] is a 79 year old female with past medical history significant for CAD s/p PCI/stent, CKD stage IV (follows with Nephrology, [name]), type 2 diabetes mellitus, chronic diastolic CHF, chronic bilateral lower extremity edema who presented to the ED following fall at home as she was trying to get out of the shower.  No LOC reported.  Patient with complaint of left hip pain.  In the ED, pelvis x-Faso notable for acute displaced angulated left femoral neck fracture.  Patient was noted to have a contusion/abrasion to posterior scalp, head CT and C-spine notable for possible subacute infarct right frontal area.  Neurology and orthopedics were consulted.  Creatinine 1.7 with glucose 179.  Hospital service consulted for admission for acute left hip fracture and concern of CVA.   Assessment & Plan:   Principal Problem:   Hip fracture, left, closed, initial encounter [facility]) Active Problems:   CAD (coronary artery disease)   CKD (chronic kidney disease) stage 4, GFR 15-29 ml/min (HCC)   Chronic diastolic CHF (congestive heart failure) (HCC)   Type 2 diabetes mellitus with vascular disease (HCC)   Hip fracture (HCC)   Closed fracture of neck of left femur (HCC)   Left femoral neck fracture, closed Patient presenting from home following a fall in shower with associated left hip pain.  X-Tobon pelvis notable for acute displaced, angulated subcapital left femoral neck fracture.  Patient underwent posterior lateral approach left hip hemiarthroplasty by [name] on 02/10/2020. --Norco 5-325mg  1-2 tabs q6hr prn moderate pain --Robaxin as needed for muscle spasms --morphine 1 mg every 2 hours prn severe pain --DVT prophylaxis with Plavix/aspirin/SCDs per Ortho --PT/OT following, recommend CIR; pending bed  Acute blood loss anemia Patient received 1 unit PRBC  intraoperatively for blood loss on 11/14.  --Hgb 13.3>14.6>11.4>11.0>11.8>11.5>11.1 --Continue to monitor hemoglobin daily  Acute hypoxic respiratory failure Postoperatively, patient requiring supplemental oxygen via facemask at 4 L.  Suspect anesthesia effect.  Currently on 2 L nasal cannula this morning with SPO2 98%, titrated down to 2 L nasal cannula.  Chest x-[place] 02/12/2020 with mild pulmonary vascular congestion --Continue mental oxygen, maintain SPO2 greater than 92% --Incentive spirometry --Lasix 40 mg IV x1 today --wean O2 as able  Right frontal subdural hematoma Presenting following a fall in the bathtub in which she struck the back of her head.  Initial CT head/C-spine shows no acute intracranial injury, no acute fracture or listhesis but does note possible subacute versus remote cortical infarct precentral gyrus of right frontal lobe.  MR brain notable for thin acute 4 mm subdural hematoma right frontal lobe, no reports of ischemic CVA.  Neurosurgery was consulted, no surgical issue and no repeat scan should be obtained per [name]; and even questions if hematoma exists.  --Supportive care, continue neurochecks --Neurosurgery signed off 02/08/2020  CAD s/p PCI/stent On Plavix 75 mg p.o. daily, Crestor 20 mg p.o. daily, metoprolol succinate 12.5 mg p.o. nightly.  Follows with cardiology outpatient, [name].  Previous bare-metal stent/PCI LAD 2006. --Continue metoprolol succinate 12.5mg  daily --Resumed home Plavix and Crestor  Chronic diastolic congestive heart failure --Continue metoprolol succinate 12.5 mg p.o. daily --Also on Bumex and metolazone (Mon/Thur) - restart 11/18 --lasix 40mg  IV x 1 today --Strict I's and O's and daily weights  CKD stage IV Follows with nephrology outpatient, [name].  Creatinine 1.70 on admission, baseline 1.6-1.9.  On Bumex and metolazone outpatient. --Cr 1.70>1.69>1.46>1.56>1.59>1.52 --Continue monitor strict I's and O's and daily  weights --Continue to hold diuretics for now, until oral intake improves  Type 2 diabetes mellitus Home regimen includes Januvia 25 mg p.o. daily.  Hemoglobin A1c 8.8, poorly controlled --Hold oral hypoglycemics while inpatient --Insulin sliding scale for coverage --CBGs qAC/HS  HLD --continue home Crestor  Hx Breast Ca Diagnosed in 2018, follows with [name] outpatient; last visit 12/05/2019   DVT prophylaxis: SCDs, aspirin/Plavix Code Status: Full Code Family Communication: No family present at bedside this morning  Disposition Plan:  Status is: Inpatient  Remains inpatient appropriate because:Ongoing active pain requiring inpatient pain management, Ongoing diagnostic testing needed not appropriate for outpatient work up, Unsafe d/c plan, IV treatments appropriate due to intensity of illness or inability to take PO and Inpatient level of care appropriate due to severity of illness   Dispo: The patient is from: Home              Anticipated d/c is to: CIR              Anticipated d/c date is: 1 day              Patient currently is not medically stable to d/c.     Consultants:   Orthopedics, [name]  Neurosurgery, [name] - signed off 02/08/2020  Neurology - signed off 02/08/2020  Procedures:   posterior lateral approach left hip hemiarthroplasty by [name] on 02/10/2020  Antimicrobials:   None   Subjective: Patient seen and examined at bedside, resting comfortably in bed.  Patient states dyspnea improved.  Continues on supplemental oxygen.  States pain improved, worked better with physical therapy yesterday.  Overnight required in and out catheterization for retained urine. No other complaints or concerns at this time. Patient denies headache, no visual changes, no chest pain, no palpitations, no shortness breath, no abdominal pain, no fever/chills/night sweats, no nausea/vomiting/diarrhea.  No acute events overnight per nursing  staff.  Objective: Vitals:   02/12/20 2335 02/13/20 0405 02/13/20 0453 02/13/20 0750  BP: 116/79 (!) 135/93  120/81  Pulse: 95 91  97  Resp: (!) 22 (!) 25  20  Temp: 98.6 F (37 C) 98.6 F (37 C)  97.8 F (36.6 C)  TempSrc: Oral Oral  Oral  SpO2: 90% 98%  99%  Weight:   89.4 kg   Height:        Intake/Output Summary (Last 24 hours) at 02/13/2020 1017 Last data filed at 02/13/2020 0000 Gross per 24 hour  Intake 990 ml  Output 1431 ml  Net -441 ml   Filed Weights   02/11/20 0342 02/12/20 0500 02/13/20 0453  Weight: 89.3 kg 89.3 kg 89.4 kg    Examination:  General exam: Appears calm, no acute distress HEENT: Noted posterior scalp abrasion/contusion Respiratory system: Clear to auscultation. Respiratory effort normal.  On 2 L nasal cannula with SPO2 98% Cardiovascular system: S1 & S2 heard, RRR. No JVD, murmurs, rubs, gallops or clicks. No pedal edema. Gastrointestinal system: Abdomen is nondistended, soft and nontender. No organomegaly or masses felt. Normal bowel sounds heard. Central nervous system: Alert, oriented to time and person, slightly confused regarding place. No focal neurological deficits. Extremities: Moves all extremities independently besides left lower extremity, left lower surgical incision site noted, clean/dry/intact Skin: No rashes, lesions or ulcers Psychiatry: Judgement and insight appear normal. Mood & affect appropriate.     Data Reviewed: I have personally reviewed following labs  and imaging studies  CBC: Recent Labs  Lab 02/08/20 0235 02/08/20 0241 02/09/20 0403 02/09/20 0403 02/10/20 0246 02/10/20 1525 02/11/20 0623 02/12/20 0309 02/13/20 0126  WBC 8.1   < > 9.1  --  11.5*  --  12.6* 14.4* 12.6*  NEUTROABS 6.4  --   --   --   --   --   --   --   --   HGB 13.3   < > 11.4*   < > 11.0* 8.8* 11.8* 11.5* 11.1*  HCT 43.4   < > 37.2   < > 35.6* 26.0* 37.3 36.7 35.0*  MCV 94.1   < > 94.2  --  92.0  --  90.8 90.2 89.5  PLT 217   < > 172   --  156  --  160 189 181   < > = values in this interval not displayed.   Basic Metabolic Panel: Recent Labs  Lab 02/09/20 0403 02/09/20 0403 02/10/20 0246 02/10/20 1525 02/11/20 0623 02/12/20 0309 02/13/20 0126  NA 138   < > 137 139 137 136 136  K 4.7   < > 4.2 4.0 4.7 4.0 4.1  CL 101   < > 103 106 101 100 103  CO2 30  --  28  --  27 26 24   GLUCOSE 130*   < > 127* 103* 213* 175* 177*  BUN 23   < > 21 18 24* 34* 40*  CREATININE 1.69*   < > 1.46* 0.80 1.56* 1.59* 1.52*  CALCIUM 9.0  --  9.0  --  9.1 8.9 8.8*  MG 2.2  --   --   --  2.5*  --   --    < > = values in this interval not displayed.   GFR: Estimated Creatinine Clearance: 32.7 mL/min (A) (by C-G formula based on SCr of 1.52 mg/dL (H)). Liver Function Tests: Recent Labs  Lab 02/08/20 0235  AST 44*  ALT 29  ALKPHOS 104  BILITOT 0.5  PROT 7.2  ALBUMIN 3.8   No results for input(s): LIPASE, AMYLASE in the last 168 hours. No results for input(s): AMMONIA in the last 168 hours. Coagulation Profile: Recent Labs  Lab 02/08/20 0235  INR 1.1   Cardiac Enzymes: No results for input(s): CKTOTAL, CKMB, CKMBINDEX, TROPONINI in the last 168 hours. BNP (last 3 results) No results for input(s): PROBNP in the last 8760 hours. HbA1C: No results for input(s): HGBA1C in the last 72 hours. CBG: Recent Labs  Lab 02/12/20 0743 02/12/20 1141 02/12/20 1555 02/12/20 2210 02/13/20 0748  GLUCAP 146* 170* 176* 175* 129*   Lipid Profile: No results for input(s): CHOL, HDL, LDLCALC, TRIG, CHOLHDL, LDLDIRECT in the last 72 hours. Thyroid Function Tests: No results for input(s): TSH, T4TOTAL, FREET4, T3FREE, THYROIDAB in the last 72 hours. Anemia Panel: No results for input(s): VITAMINB12, FOLATE, FERRITIN, TIBC, IRON, RETICCTPCT in the last 72 hours. Sepsis Labs: No results for input(s): PROCALCITON, LATICACIDVEN in the last 168 hours.  Recent Results (from the past 240 hour(s))  Respiratory Panel by RT PCR (Flu A&B,  Covid) - Nasopharyngeal Swab     Status: None   Collection Time: 02/08/20  3:42 AM   Specimen: Nasopharyngeal Swab  Result Value Ref Range Status   SARS Coronavirus 2 by RT PCR NEGATIVE NEGATIVE Final    Comment: (NOTE) SARS-CoV-2 target nucleic acids are NOT DETECTED.  The SARS-CoV-2 RNA is generally detectable in upper respiratoy specimens during the acute phase of  infection. The lowest concentration of SARS-CoV-2 viral copies this assay can detect is 131 copies/mL. A negative result does not preclude SARS-Cov-2 infection and should not be used as the sole basis for treatment or other patient management decisions. A negative result may occur with  improper specimen collection/handling, submission of specimen other than nasopharyngeal swab, presence of viral mutation(s) within the areas targeted by this assay, and inadequate number of viral copies (<131 copies/mL). A negative result must be combined with clinical observations, patient history, and epidemiological information. The expected result is Negative.  Fact Sheet for Patients:  PinkCheek.be  Fact Sheet for Healthcare Providers:  GravelBags.it  This test is no t yet approved or cleared by the [place] FDA and  has been authorized for detection and/or diagnosis of SARS-CoV-2 by FDA under an Emergency Use Authorization (EUA). This EUA will remain  in effect (meaning this test can be used) for the duration of the COVID-19 declaration under Section 564(b)(1) of the Act, 21 U.S.C. section 360bbb-3(b)(1), unless the authorization is terminated or revoked sooner.     Influenza A by PCR NEGATIVE NEGATIVE Final   Influenza B by PCR NEGATIVE NEGATIVE Final    Comment: (NOTE) The Xpert Xpress SARS-CoV-2/FLU/RSV assay is intended as an aid in  the diagnosis of influenza from Nasopharyngeal swab specimens and  should not be used as a sole basis for treatment. Nasal  washings and  aspirates are unacceptable for Xpert Xpress SARS-CoV-2/FLU/RSV  testing.  Fact Sheet for Patients: PinkCheek.be  Fact Sheet for Healthcare Providers: GravelBags.it  This test is not yet approved or cleared by the [place] FDA and  has been authorized for detection and/or diagnosis of SARS-CoV-2 by  FDA under an Emergency Use Authorization (EUA). This EUA will remain  in effect (meaning this test can be used) for the duration of the  Covid-19 declaration under Section 564(b)(1) of the Act, 21  U.S.C. section 360bbb-3(b)(1), unless the authorization is  terminated or revoked. Performed at [facility], [place] [address] [place], [place] [address]   Surgical pcr screen     Status: None   Collection Time: 02/08/20  5:42 PM   Specimen: Nasal Mucosa; Nasal Swab  Result Value Ref Range Status   MRSA, PCR NEGATIVE NEGATIVE Final   Staphylococcus aureus NEGATIVE NEGATIVE Final    Comment: (NOTE) The Xpert SA Assay (FDA approved for NASAL specimens in patients 49 years of age and older), is one component of a comprehensive surveillance program. It is not intended to diagnose infection nor to guide or monitor treatment. Performed at [facility], [place] [address] [place], [place] [address]          Radiology Studies: DG Chest 1 View  Result Date: 02/12/2020 CLINICAL DATA:  Dyspnea EXAM: CHEST  1 VIEW COMPARISON:  Radiograph 02/08/2020 FINDINGS: Chronic elevation of the right hemidiaphragm with some adjacent atelectatic changes. There is some increasing vascular congestion with hazy interstitial opacity and fissural thickening. No focal consolidative opacity is seen. Stable cardiomediastinal contours with a calcified, tortuous aorta. Telemetry leads and support devices overlie the chest. Degenerative changes are present in the imaged spine and shoulders. IMPRESSION: 1. Increasing vascular congestion  and features which may suggest early interstitial edema. 2. Chronic elevation of the right hemidiaphragm with adjacent atelectatic changes. 3.  Aortic Atherosclerosis (ICD10-I70.0). Electronically Signed   By: Lovena Le M.D.   On: 02/12/2020 19:30        Scheduled Meds: . sodium chloride  Intravenous Once  . aspirin  81 mg Oral BID  . Chlorhexidine Gluconate Cloth  6 each Topical Daily  . clopidogrel  75 mg Oral Daily  . docusate sodium  100 mg Oral BID  . feeding supplement (GLUCERNA SHAKE)  237 mL Oral TID BM  . insulin aspart  0-9 Units Subcutaneous TID AC & HS  . metoprolol succinate  12.5 mg Oral Daily  . multivitamin with minerals  1 tablet Oral Daily  . naproxen  250 mg Oral BID WC  . rosuvastatin  20 mg Oral Daily   Continuous Infusions:    LOS: 5 days    Time spent: 39 minutes spent on chart review, discussion with nursing staff, consultants, updating family and interview/physical exam; more than 50% of that time was spent in counseling and/or coordination of care.    Amit Leece J [place] DO [facility] Available via Epic secure chat 7am-7pm After these hours, please refer to coverage provider listed on [email or web address] 02/13/2020, 10:17 AM

## 2020-02-13 NOTE — Progress Notes (Signed)
Pt was able to ambulate to the bathroom x 2 this shift. She was able to have BM both times. Still having trouble voiding bladder. Bladder scan done and 650 ml of urine noted. This nurse did an in and out cath and got 900 ml out of bladder. At that time pt told this RN that her husband has to in and out cath her at home as well. Approximately 1800 pt had taken all leads off and when this nurse went into the rm she was noted to be trying to get out of the bed. Pt stated she woke up because she could hear a baby crying and needed to get to it. She was easily consoled however she thought this nurse was mad because this nurse would be leaving for the night. Katherina Right RN

## 2020-02-13 NOTE — Progress Notes (Signed)
Bladder scan performed on patient per MD's order. A total of 481 ml of urine scanned. In & Out Cath performed, and a total of 680 ml of urine  Removed, Urine sent to lab for urinalysis as ordered. Post scan showed 50 ml of urine residual.

## 2020-02-14 DIAGNOSIS — E1159 Type 2 diabetes mellitus with other circulatory complications: Secondary | ICD-10-CM | POA: Diagnosis not present

## 2020-02-14 LAB — CBC
HCT: 31.3 % — ABNORMAL LOW (ref 36.0–46.0)
Hemoglobin: 10.1 g/dL — ABNORMAL LOW (ref 12.0–15.0)
MCH: 28.6 pg (ref 26.0–34.0)
MCHC: 32.3 g/dL (ref 30.0–36.0)
MCV: 88.7 fL (ref 80.0–100.0)
Platelets: 197 10*3/uL (ref 150–400)
RBC: 3.53 MIL/uL — ABNORMAL LOW (ref 3.87–5.11)
RDW: 15 % (ref 11.5–15.5)
WBC: 12.4 10*3/uL — ABNORMAL HIGH (ref 4.0–10.5)
nRBC: 0 % (ref 0.0–0.2)

## 2020-02-14 LAB — BASIC METABOLIC PANEL
Anion gap: 10 (ref 5–15)
BUN: 42 mg/dL — ABNORMAL HIGH (ref 8–23)
CO2: 25 mmol/L (ref 22–32)
Calcium: 8.8 mg/dL — ABNORMAL LOW (ref 8.9–10.3)
Chloride: 103 mmol/L (ref 98–111)
Creatinine, Ser: 1.45 mg/dL — ABNORMAL HIGH (ref 0.44–1.00)
GFR, Estimated: 36 mL/min — ABNORMAL LOW (ref >60)
Glucose, Bld: 196 mg/dL — ABNORMAL HIGH (ref 70–99)
Potassium: 3.8 mmol/L (ref 3.5–5.1)
Sodium: 138 mmol/L (ref 135–145)

## 2020-02-14 LAB — GLUCOSE, CAPILLARY
Glucose-Capillary: 187 mg/dL — ABNORMAL HIGH (ref 70–99)
Glucose-Capillary: 157 mg/dL — ABNORMAL HIGH (ref 70–99)
Glucose-Capillary: 150 mg/dL — ABNORMAL HIGH (ref 70–99)
Glucose-Capillary: 162 mg/dL — ABNORMAL HIGH (ref 70–99)

## 2020-02-14 LAB — URINE CULTURE: Culture: NO GROWTH

## 2020-02-14 LAB — MAGNESIUM: Magnesium: 2.5 mg/dL — ABNORMAL HIGH (ref 1.7–2.4)

## 2020-02-14 MED ORDER — CEREFOLIN NAC 6-2-600 MG PO TABS
1.0000 | ORAL_TABLET | Freq: Every morning | ORAL | Status: DC
  Filled 2020-02-14 (×2): qty 1

## 2020-02-14 MED ORDER — BETHANECHOL CHLORIDE 25 MG PO TABS
25.0000 mg | ORAL_TABLET | Freq: Three times a day (TID) | ORAL | Status: DC
  Administered 2020-02-14 – 2020-02-15 (×3): 25 mg via ORAL
  Filled 2020-02-14 (×3): qty 1

## 2020-02-14 MED ORDER — POLYETHYLENE GLYCOL 3350 17 G PO PACK: 17.0000 g | PACK | Freq: Every day | ORAL | Status: DC | PRN

## 2020-02-14 NOTE — Progress Notes (Signed)
Patients husband brought in [place]. Doctor was notified and orders placed to dispense medication. Medication counted and documented and taken to the pharmacy.  32 pills sent to the Pharmacy.

## 2020-02-14 NOTE — Progress Notes (Signed)
Occupational Therapy Treatment Patient Details Name: Anjulie Dipierro MRN: [id number] DOB: Jul 23, 1940 Today's Date: 02/14/2020    History of present illness Pt is an 79 yo female admitted after a fall after getting out of shower with resulting L femoral neck fx. Pt underwent L posterior lateral THR with posterior hip precautions.  Pt also found to have possible subacute infarct to R frontal lobe.  Neuorsurgery not sure it is a true hematoma and has no follow up. Pt did hit back of head in fall as seen with abrasion. 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 Pt has also had TKR.    OT comments  Patient continues to make steady progress towards goals in skilled OT session. Patient's session encompassed co-treat with PT in order to advance gait and functional independence. Pt remains limited by lethargy and "overall not feeling like myself" in session, and able to transfer back to the bed with minimal ambulation prior to feeling fatigued (VSS throughout). Education provided with regard to hip precautions, as pt remains unable to state, but can complete with cues. Pt remains an excellent candidate for CIR, and continues to progress with BP and O2 needs. Discharge remains appropriate, will continue to follow acutely.    Follow Up Recommendations  CIR;Supervision/Assistance - 24 hour    Equipment Recommendations       Recommendations for Other Services      Precautions / Restrictions Precautions Precautions: Fall;Posterior Hip Precaution Booklet Issued: No Precaution Comments: Reviewed hip precautions- needs handout. Unable to state after review x2 Required Braces or Orthoses: Other Brace Other Brace: 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 Restrictions Weight Bearing Restrictions: No LLE Weight Bearing: Weight bearing as tolerated Other Position/Activity Restrictions: posterior hip precautions       Mobility Bed Mobility                General bed mobility comments: Up in chair upon arrival.  Transfers Overall transfer level: Needs assistance Equipment used: Rolling walker (2 wheeled) Transfers: Sit to/from Stand Sit to Stand: Mod assist         General transfer comment: Assist to power to standing with cues for hand placement, slow to rise, cues to stand fully upright, stated dizziness but BP improved with O2 remaining stable at 1L    Balance Overall balance assessment: Needs assistance Sitting-balance support: Feet supported;No upper extremity supported Sitting balance-Leahy Scale: Fair Sitting balance - Comments: Limited unsupported sitting tolerance.   Standing balance support: During functional activity Standing balance-Leahy Scale: Poor Standing balance comment: Requires UE support in standing with flexed posture.                           ADL either performed or assessed with clinical judgement   ADL Overall ADL's : Needs assistance/impaired                                     Functional mobility during ADLs: Moderate assistance;+2 for physical assistance;+2 for safety/equipment General ADL Comments: Pt unable to stand or complete any LE adls or toileting adls due to pain, and dizziness, stating she just doesnt feel like herself, BP more stable in session to date     Vision       Perception     Praxis  Cognition Arousal/Alertness: Awake/alert Behavior During Therapy: Anxious Overall Cognitive Status: No family/caregiver present to determine baseline cognitive functioning                                 General Comments: Remains anxious with dyspnea with mobility, also unable to state hip precuations, otherwise oriented and following commands        Exercises     Shoulder Instructions       General Comments      Pertinent Vitals/ Pain       Pain Assessment: Faces Faces Pain Scale: Hurts even more Pain Location: Lft  hip Pain Descriptors / Indicators: Aching;Discomfort;Grimacing;Sore;Guarding Pain Intervention(s): Limited activity within patient's tolerance;Monitored during session;Repositioned  Home Living                                          Prior Functioning/Environment              Frequency  Min 3X/week        Progress Toward Goals  OT Goals(current goals can now be found in the care plan section)  Progress towards OT goals: Progressing toward goals  Acute Rehab OT Goals Patient Stated Goal: to be able to use the commode and not a bedpain OT Goal Formulation: With patient Time For Goal Achievement: 02/25/20 Potential to Achieve Goals: Good  Plan Discharge plan remains appropriate    Co-evaluation      Reason for Co-Treatment: Complexity of the patient's impairments (multi-system involvement);Necessary to address cognition/behavior during functional activity;For patient/therapist safety;To address functional/ADL transfers PT goals addressed during session: Mobility/safety with mobility;Balance;Proper use of DME;Strengthening/ROM OT goals addressed during session: ADL's and self-care;Strengthening/ROM      AM-PAC OT "6 Clicks" Daily Activity     Outcome Measure   Help from another person eating meals?: None Help from another person taking care of personal grooming?: None Help from another person toileting, which includes using toliet, bedpan, or urinal?: A Lot Help from another person bathing (including washing, rinsing, drying)?: A Lot Help from another person to put on and taking off regular upper body clothing?: A Lot Help from another person to put on and taking off regular lower body clothing?: A Lot 6 Click Score: 16    End of Session Equipment Utilized During Treatment: Oxygen;Gait belt  OT Visit Diagnosis: Unsteadiness on feet (R26.81);Other abnormalities of gait and mobility (R26.89);Pain;Muscle weakness (generalized) (M62.81) Pain -  Right/Left: Left Pain - part of body: Hip   Activity Tolerance Patient tolerated treatment well;Patient limited by lethargy   Patient Left in bed;with call bell/phone within reach;with bed alarm set   Nurse Communication Mobility status        Time: 4401-0272 OT Time Calculation (min): 31 min  Charges: OT General Charges $OT Visit: 1 Visit OT Treatments $Self Care/Home Management : 8-22 mins  Corinne Ports E. Siniya Lichty, COTA/L Acute Rehabilitation Services [phone] [place] 02/14/2020, 4:31 PM

## 2020-02-14 NOTE — Progress Notes (Signed)
PROGRESS NOTE    [name]  ZOX:[id number] DOB: [birth year] DOA: 02/08/2020 PCP: [name], DO    Brief Narrative:  [name] is a 79 year old female with past medical history significant for CAD s/p PCI/stent, CKD stage IV (follows with Nephrology, [name]), type 2 diabetes mellitus, chronic diastolic CHF, chronic bilateral lower extremity edema who presented to the ED following fall at home as she was trying to get out of the shower.  No LOC reported.  Patient with complaint of left hip pain.  In the ED, pelvis x-Espey notable for acute displaced angulated left femoral neck fracture.  Patient was noted to have a contusion/abrasion to posterior scalp, head CT and C-spine notable for possible subacute infarct right frontal area.  Neurology and orthopedics were consulted.  Creatinine 1.7 with glucose 179.  Hospital service consulted for admission for acute left hip fracture and concern of CVA.   Assessment & Plan:   Principal Problem:   Hip fracture, left, closed, initial encounter [facility]) Active Problems:   CAD (coronary artery disease)   CKD (chronic kidney disease) stage 4, GFR 15-29 ml/min (HCC)   Chronic diastolic CHF (congestive heart failure) (HCC)   Type 2 diabetes mellitus with vascular disease (HCC)   Hip fracture (HCC)   Closed fracture of neck of left femur (HCC)   Left femoral neck fracture, closed Patient presenting from home following a fall in shower with associated left hip pain.  X-Cartwright pelvis notable for acute displaced, angulated subcapital left femoral neck fracture.  Patient underwent posterior lateral approach left hip hemiarthroplasty by [name] on 02/10/2020. --Norco 5-325mg  1-2 tabs q6hr prn moderate pain --Robaxin as needed for muscle spasms --morphine 1 mg every 2 hours prn severe pain --DVT prophylaxis with Plavix/aspirin/SCDs per Ortho --PT/OT following, recommend CIR; pending bed  Acute postoperative urinary retention Etiology likely  from decreased mobility.  In and out cath 3 times past 24 hours.  Foley catheter placed on 02/14/2020. --Start bethanechol 25 mg 3 times daily --Consider voiding trial 02/15/2020  Acute blood loss anemia Patient received 1 unit PRBC intraoperatively for blood loss on 11/14.  --Hgb 13.3>14.6>11.4>11.0>11.8>11.5>11.1>10.1 --Continue to monitor hemoglobin daily  Acute hypoxic respiratory failure Postoperatively, patient requiring supplemental oxygen via facemask at 4 L.  Suspect anesthesia effect.  Currently on 2 L nasal cannula this morning with SPO2 96%, titrated down to 2 L nasal cannula.  Chest x-Mittag 02/12/2020 with mild pulmonary vascular congestion --Continue mental oxygen, maintain SPO2 greater than 92% --Incentive spirometry --Restarted home Bumex --wean O2 as able  Right frontal subdural hematoma Presenting following a fall in the bathtub in which she struck the back of her head.  Initial CT head/C-spine shows no acute intracranial injury, no acute fracture or listhesis but does note possible subacute versus remote cortical infarct precentral gyrus of right frontal lobe.  MR brain notable for thin acute 4 mm subdural hematoma right frontal lobe, no reports of ischemic CVA.  Neurosurgery was consulted, no surgical issue and no repeat scan should be obtained per [name]; and even questions if hematoma exists.  --Supportive care, continue neurochecks --Neurosurgery signed off 02/08/2020  CAD s/p PCI/stent On Plavix 75 mg p.o. daily, Crestor 20 mg p.o. daily, metoprolol succinate 12.5 mg p.o. nightly.  Follows with cardiology outpatient, [name].  Previous bare-metal stent/PCI LAD 2006. --Continue metoprolol succinate 12.5mg  daily --Resumed home Plavix and Crestor  Chronic diastolic congestive heart failure --Continue metoprolol succinate 12.5 mg p.o. daily --Also on Bumex and metolazone (  Mon/Thur), restarted Bumex 11/17 --Strict I's and O's and daily weights  CKD stage  IV Follows with nephrology outpatient, [name].  Creatinine 1.70 on admission, baseline 1.6-1.9. On Bumex and metolazone outpatient. --Cr 1.70>1.69>1.46>1.56>1.59>1.52>1.45 --Continue monitor strict I's and O's and daily weights --Continue to hold diuretics for now, until oral intake improves  Type 2 diabetes mellitus Home regimen includes Januvia 25 mg p.o. daily.  Hemoglobin A1c 8.8, poorly controlled --Hold oral hypoglycemics while inpatient --Insulin sliding scale for coverage --CBGs qAC/HS  HLD --continue home Crestor  Hx Breast Ca Diagnosed in 2018, follows with [name] outpatient; last visit 12/05/2019   DVT prophylaxis: SCDs, aspirin/Plavix Code Status: Full Code Family Communication: No family present at bedside this morning  Disposition Plan:  Status is: Inpatient  Remains inpatient appropriate because:Ongoing active pain requiring inpatient pain management, Ongoing diagnostic testing needed not appropriate for outpatient work up, Unsafe d/c plan, IV treatments appropriate due to intensity of illness or inability to take PO and Inpatient level of care appropriate due to severity of illness   Dispo: The patient is from: Home              Anticipated d/c is to: CIR              Anticipated d/c date is: 1 day              Patient currently is not medically stable to d/c.     Consultants:   Orthopedics, [name]  Neurosurgery, [name] - signed off 02/08/2020  Neurology - signed off 02/08/2020  Procedures:   posterior lateral approach left hip hemiarthroplasty by [name] on 02/10/2020  Antimicrobials:   None   Subjective: Patient seen and examined bedside, lying in bed.  Emotional this morning for unclear reason.  Suspect depressed from hospitalization with subsequent hip fracture.  States would like to go home, discussed with her needs further rehabilitation in order to be safe at home.  Nursing present, Foley catheter placed earlier this morning  for persistent urinary retention.  Patient without any other questions or concerns at this time. Patient denies headache, no visual changes, no chest pain, no palpitations, no shortness breath, no abdominal pain, no fever/chills/night sweats, no nausea/vomiting/diarrhea.  No acute events overnight per nursing staff.  Objective: Vitals:   02/13/20 2330 02/14/20 0409 02/14/20 0800 02/14/20 0911  BP: 138/82 110/61 (!) 145/89   Pulse: (!) 102 93 (!) 110 (!) 106  Resp: 20 16 16    Temp: 97.9 F (36.6 C) 97.7 F (36.5 C) 98.9 F (37.2 C)   TempSrc: Oral Axillary Axillary   SpO2: 98% 96% 97%   Weight:      Height:        Intake/Output Summary (Last 24 hours) at 02/14/2020 1144 Last data filed at 02/14/2020 0945 Gross per 24 hour  Intake 480 ml  Output 2350 ml  Net -1870 ml   Filed Weights   02/11/20 0342 02/12/20 0500 02/13/20 0453  Weight: 89.3 kg 89.3 kg 89.4 kg    Examination:  General exam: Appears calm, no acute distress HEENT: Noted posterior scalp abrasion/contusion Respiratory system: Clear to auscultation. Respiratory effort normal.  On 2 L nasal cannula with SPO2 96% Cardiovascular system: S1 & S2 heard, RRR. No JVD, murmurs, rubs, gallops or clicks. No pedal edema. Gastrointestinal system: Abdomen is nondistended, soft and nontender. No organomegaly or masses felt. Normal bowel sounds heard. GU: Foley catheter noted in place, draining clear yellow urine Central nervous system:  Alert, oriented to person/place/time to time and person, No focal neurological deficits. Extremities: Moves all extremities independently besides left lower extremity, left lower surgical incision site noted, clean/dry/intact Skin: No rashes, lesions or ulcers Psychiatry: Judgement and insight appear normal. Mood & affect appropriate.     Data Reviewed: I have personally reviewed following labs and imaging studies  CBC: Recent Labs  Lab 02/08/20 0235 02/08/20 0241 02/10/20 0246  02/10/20 0246 02/10/20 1525 02/11/20 0623 02/12/20 0309 02/13/20 0126 02/14/20 0101  WBC 8.1   < > 11.5*  --   --  12.6* 14.4* 12.6* 12.4*  NEUTROABS 6.4  --   --   --   --   --   --   --   --   HGB 13.3   < > 11.0*   < > 8.8* 11.8* 11.5* 11.1* 10.1*  HCT 43.4   < > 35.6*   < > 26.0* 37.3 36.7 35.0* 31.3*  MCV 94.1   < > 92.0  --   --  90.8 90.2 89.5 88.7  PLT 217   < > 156  --   --  160 189 181 197   < > = values in this interval not displayed.   Basic Metabolic Panel: Recent Labs  Lab 02/09/20 0403 02/09/20 0403 02/10/20 0246 02/10/20 0246 02/10/20 1525 02/11/20 4496 02/12/20 0309 02/13/20 0126 02/14/20 0101  NA 138   < > 137   < > 139 137 136 136 138  K 4.7   < > 4.2   < > 4.0 4.7 4.0 4.1 3.8  CL 101   < > 103   < > 106 101 100 103 103  CO2 30   < > 28  --   --  27 26 24 25   GLUCOSE 130*   < > 127*   < > 103* 213* 175* 177* 196*  BUN 23   < > 21   < > 18 24* 34* 40* 42*  CREATININE 1.69*   < > 1.46*   < > 0.80 1.56* 1.59* 1.52* 1.45*  CALCIUM 9.0   < > 9.0  --   --  9.1 8.9 8.8* 8.8*  MG 2.2  --   --   --   --  2.5*  --   --  2.5*   < > = values in this interval not displayed.   GFR: Estimated Creatinine Clearance: 34.2 mL/min (A) (by C-G formula based on SCr of 1.45 mg/dL (H)). Liver Function Tests: Recent Labs  Lab 02/08/20 0235  AST 44*  ALT 29  ALKPHOS 104  BILITOT 0.5  PROT 7.2  ALBUMIN 3.8   No results for input(s): LIPASE, AMYLASE in the last 168 hours. No results for input(s): AMMONIA in the last 168 hours. Coagulation Profile: Recent Labs  Lab 02/08/20 0235  INR 1.1   Cardiac Enzymes: No results for input(s): CKTOTAL, CKMB, CKMBINDEX, TROPONINI in the last 168 hours. BNP (last 3 results) No results for input(s): PROBNP in the last 8760 hours. HbA1C: No results for input(s): HGBA1C in the last 72 hours. CBG: Recent Labs  Lab 02/13/20 0748 02/13/20 1133 02/13/20 1623 02/13/20 2131 02/14/20 0819  GLUCAP 129* 175* 161* 161* 187*    Lipid Profile: No results for input(s): CHOL, HDL, LDLCALC, TRIG, CHOLHDL, LDLDIRECT in the last 72 hours. Thyroid Function Tests: No results for input(s): TSH, T4TOTAL, FREET4, T3FREE, THYROIDAB in the last 72 hours. Anemia Panel: No results for input(s): VITAMINB12, FOLATE, FERRITIN, TIBC,  IRON, RETICCTPCT in the last 72 hours. Sepsis Labs: No results for input(s): PROCALCITON, LATICACIDVEN in the last 168 hours.  Recent Results (from the past 240 hour(s))  Respiratory Panel by RT PCR (Flu A&B, Covid) - Nasopharyngeal Swab     Status: None   Collection Time: 02/08/20  3:42 AM   Specimen: Nasopharyngeal Swab  Result Value Ref Range Status   SARS Coronavirus 2 by RT PCR NEGATIVE NEGATIVE Final    Comment: (NOTE) SARS-CoV-2 target nucleic acids are NOT DETECTED.  The SARS-CoV-2 RNA is generally detectable in upper respiratoy specimens during the acute phase of infection. The lowest concentration of SARS-CoV-2 viral copies this assay can detect is 131 copies/mL. A negative result does not preclude SARS-Cov-2 infection and should not be used as the sole basis for treatment or other patient management decisions. A negative result may occur with  improper specimen collection/handling, submission of specimen other than nasopharyngeal swab, presence of viral mutation(s) within the areas targeted by this assay, and inadequate number of viral copies (<131 copies/mL). A negative result must be combined with clinical observations, patient history, and epidemiological information. The expected result is Negative.  Fact Sheet for Patients:  PinkCheek.be  Fact Sheet for Healthcare Providers:  GravelBags.it  This test is no t yet approved or cleared by the [place] FDA and  has been authorized for detection and/or diagnosis of SARS-CoV-2 by FDA under an Emergency Use Authorization (EUA). This EUA will remain  in effect  (meaning this test can be used) for the duration of the COVID-19 declaration under Section 564(b)(1) of the Act, 21 U.S.C. section 360bbb-3(b)(1), unless the authorization is terminated or revoked sooner.     Influenza A by PCR NEGATIVE NEGATIVE Final   Influenza B by PCR NEGATIVE NEGATIVE Final    Comment: (NOTE) The Xpert Xpress SARS-CoV-2/FLU/RSV assay is intended as an aid in  the diagnosis of influenza from Nasopharyngeal swab specimens and  should not be used as a sole basis for treatment. Nasal washings and  aspirates are unacceptable for Xpert Xpress SARS-CoV-2/FLU/RSV  testing.  Fact Sheet for Patients: PinkCheek.be  Fact Sheet for Healthcare Providers: GravelBags.it  This test is not yet approved or cleared by the [place] FDA and  has been authorized for detection and/or diagnosis of SARS-CoV-2 by  FDA under an Emergency Use Authorization (EUA). This EUA will remain  in effect (meaning this test can be used) for the duration of the  Covid-19 declaration under Section 564(b)(1) of the Act, 21  U.S.C. section 360bbb-3(b)(1), unless the authorization is  terminated or revoked. Performed at [facility], [place] [address] [place], [place] [address]   Surgical pcr screen     Status: None   Collection Time: 02/08/20  5:42 PM   Specimen: Nasal Mucosa; Nasal Swab  Result Value Ref Range Status   MRSA, PCR NEGATIVE NEGATIVE Final   Staphylococcus aureus NEGATIVE NEGATIVE Final    Comment: (NOTE) The Xpert SA Assay (FDA approved for NASAL specimens in patients 59 years of age and older), is one component of a comprehensive surveillance program. It is not intended to diagnose infection nor to guide or monitor treatment. Performed at [facility], [place] [address] [place],  [address]   Culture, Urine     Status: None   Collection Time: 02/13/20  1:00 AM   Specimen: Urine, Random  Result Value  Ref Range Status   Specimen Description URINE, RANDOM  Final   Special Requests  NONE  Final   Culture   Final    NO GROWTH Performed at [facility], [place] [address] [place],  [address]    Report Status 02/14/2020 FINAL  Final         Radiology Studies: DG Chest 1 View  Result Date: 02/12/2020 CLINICAL DATA:  Dyspnea EXAM: CHEST  1 VIEW COMPARISON:  Radiograph 02/08/2020 FINDINGS: Chronic elevation of the right hemidiaphragm with some adjacent atelectatic changes. There is some increasing vascular congestion with hazy interstitial opacity and fissural thickening. No focal consolidative opacity is seen. Stable cardiomediastinal contours with a calcified, tortuous aorta. Telemetry leads and support devices overlie the chest. Degenerative changes are present in the imaged spine and shoulders. IMPRESSION: 1. Increasing vascular congestion and features which may suggest early interstitial edema. 2. Chronic elevation of the right hemidiaphragm with adjacent atelectatic changes. 3.  Aortic Atherosclerosis (ICD10-I70.0). Electronically Signed   By: Lovena Le M.D.   On: 02/12/2020 19:30        Scheduled Meds: . sodium chloride   Intravenous Once  . aspirin  81 mg Oral BID  . bethanechol  25 mg Oral TID  . bumetanide  2 mg Oral Daily  . Chlorhexidine Gluconate Cloth  6 each Topical Daily  . clopidogrel  75 mg Oral Daily  . docusate sodium  100 mg Oral BID  . feeding supplement (GLUCERNA SHAKE)  237 mL Oral TID BM  . insulin aspart  0-9 Units Subcutaneous TID AC & HS  . metoprolol succinate  12.5 mg Oral Daily  . multivitamin with minerals  1 tablet Oral Daily  . naproxen  250 mg Oral BID WC  . rosuvastatin  20 mg Oral Daily   Continuous Infusions:    LOS: 6 days    Time spent: 39 minutes spent on chart review, discussion with nursing staff, consultants, updating family and interview/physical exam; more than 50% of that time was spent in counseling and/or  coordination of care.    Tyray Proch J [place] DO [facility] Available via Epic secure chat 7am-7pm After these hours, please refer to coverage provider listed on [email or web address] 02/14/2020, 11:44 AM

## 2020-02-14 NOTE — Progress Notes (Signed)
Bladder scan performed, shows 396 ml present in bladder. Foley catheter placed per order: in and out cath x3 then place foley. Foley placed via sterile technique on first attempt. 400 ml removed from bladder. Pt tolerated well.

## 2020-02-14 NOTE — Progress Notes (Signed)
Physical Therapy Treatment Patient Details Name: [name] MRN: [id number] DOB: [birth year] Today's Date: 02/14/2020    History of Present Illness Pt is an 79 yo female admitted after a fall after getting out of shower with resulting L femoral neck fx. Pt underwent L posterior lateral THR with posterior hip precautions.  Pt also found to have possible subacute infarct to R frontal lobe.  Neuorsurgery not sure it is a true hematoma and has no follow up. Pt did hit back of head in fall as seen with abrasion. 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 Pt has also had TKR.     PT Comments    The pt was able to make goof progress with mobility and PT goals at this time. She was able to complete multiple sit-stand transfers with less physical assist to stand and steady at this time, as well as initiate short lateral steps to complete OOB transfers. The pt remains determined to return to her prior level of activity and mobility, but reports 7/10 fatigue following short lateral stepping from recliner to bed with assist of 2. The pt will continue to benefit from skilled PT acutely to progress functional strength and endurance for continued OOB mobility.     Follow Up Recommendations  CIR     Equipment Recommendations  Other (comment) (defer to post acute`)    Recommendations for Other Services       Precautions / Restrictions Precautions Precautions: Fall;Posterior Hip Precaution Booklet Issued: No Precaution Comments: Reviewed hip precautions- needs handout. Unable to state after review x2 Required Braces or Orthoses: Other Brace Other Brace: 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 Restrictions Weight Bearing Restrictions: No LLE Weight Bearing: Weight bearing as tolerated Other Position/Activity Restrictions: posterior hip precautions    Mobility  Bed Mobility Overal bed mobility: Needs Assistance Bed Mobility:  Supine to Sit;Sit to Supine     Supine to sit: Max assist;+2 for physical assistance Sit to supine: Total assist;+2 for physical assistance   General bed mobility comments: Up in chair upon arrival.  Transfers Overall transfer level: Needs assistance Equipment used: Rolling walker (2 wheeled) Transfers: Sit to/from Stand Sit to Stand: Mod assist         General transfer comment: Assist to power to standing with cues for hand placement, slow to rise, cues to stand fully upright, stated dizziness but BP improved with O2 remaining stable at 1L  Ambulation/Gait Ambulation/Gait assistance: Min assist Gait Distance (Feet): 3 Feet Assistive device: Rolling walker (2 wheeled) Gait Pattern/deviations: Step-through pattern;Decreased stride length   Gait velocity interpretation: <1.31 ft/sec, indicative of household ambulator General Gait Details: pt with limited lateral stepping to bed from recliner. Reports 7/10 fatigue but also stating she would like to walk to bathroom.        Balance Overall balance assessment: Needs assistance Sitting-balance support: Feet supported;No upper extremity supported Sitting balance-Leahy Scale: Fair Sitting balance - Comments: Limited unsupported sitting tolerance.   Standing balance support: During functional activity Standing balance-Leahy Scale: Poor Standing balance comment: Requires UE support in standing with flexed posture.                            Cognition Arousal/Alertness: Awake/alert Behavior During Therapy: Anxious Overall Cognitive Status: No family/caregiver present to determine baseline cognitive functioning  General Comments: Remains anxious with dyspnea with mobility, also unable to state hip precuations, otherwise oriented and following commands      Exercises      General Comments General comments (skin integrity, edema, etc.): Pt with incision on L hip and  abrasion on posterior head, VSS today on 1L O2      Pertinent Vitals/Pain Pain Assessment: Faces Faces Pain Scale: Hurts even more Pain Location: Lft hip Pain Descriptors / Indicators: Aching;Discomfort;Grimacing;Sore;Guarding Pain Intervention(s): Limited activity within patient's tolerance;Monitored during session;Repositioned;Relaxation           PT Goals (current goals can now be found in the care plan section) Acute Rehab PT Goals Patient Stated Goal: to be able to use the commode and not a bedpan PT Goal Formulation: With patient Time For Goal Achievement: 02/25/20 Potential to Achieve Goals: Fair Progress towards PT goals: Progressing toward goals    Frequency    Min 5X/week      PT Plan Current plan remains appropriate    Co-evaluation PT/OT/SLP Co-Evaluation/Treatment: Yes Reason for Co-Treatment: For patient/therapist safety;To address functional/ADL transfers PT goals addressed during session: Mobility/safety with mobility;Balance;Proper use of DME OT goals addressed during session: ADL's and self-care;Strengthening/ROM      AM-PAC PT "6 Clicks" Mobility   Outcome Measure  Help needed turning from your back to your side while in a flat bed without using bedrails?: A Little Help needed moving from lying on your back to sitting on the side of a flat bed without using bedrails?: A Little Help needed moving to and from a bed to a chair (including a wheelchair)?: A Lot Help needed standing up from a chair using your arms (e.g., wheelchair or bedside chair)?: A Little Help needed to walk in hospital room?: A Lot Help needed climbing 3-5 steps with a railing? : Total 6 Click Score: 14    End of Session Equipment Utilized During Treatment: Gait belt;Oxygen Activity Tolerance: Patient tolerated treatment well;Patient limited by fatigue Patient left: with call bell/phone within reach;in bed;with bed alarm set Nurse Communication: Mobility status PT Visit  Diagnosis: Muscle weakness (generalized) (M62.81);Pain;Other abnormalities of gait and mobility (R26.89) Pain - Right/Left: Left Pain - part of body: Hip     Time: 7106-2694 PT Time Calculation (min) (ACUTE ONLY): 31 min  Charges:  $Gait Training: 8-22 mins                     Karma Ganja, PT, DPT   Acute Rehabilitation Department Pager #: [phone]   Otho Bellows 02/14/2020, 5:38 PM

## 2020-02-14 NOTE — Progress Notes (Signed)
Inpatient Rehabilitation-Admissions Coordinator   I do not have a bed available for this patient today in CIR. Will follow up tomorrow for possible admit, pending bed availability.   Raechel Ache, OTR/L  Rehab Admissions Coordinator  [phone] 02/14/2020 11:01 AM

## 2020-02-15 ENCOUNTER — Inpatient Hospital Stay (HOSPITAL_COMMUNITY)
Admission: RE | Admit: 2020-02-15 | Discharge: 2020-02-17 | DRG: 064 | Disposition: A | Discharge status: Other Acute Inpatient Hospital | Payer: Medicare Other | Source: Intra-hospital | Attending: Physical Medicine & Rehabilitation | Admitting: Physical Medicine & Rehabilitation

## 2020-02-15 ENCOUNTER — Other Ambulatory Visit: Payer: Self-pay

## 2020-02-15 ENCOUNTER — Encounter: Payer: Self-pay | Admitting: Cardiology

## 2020-02-15 ENCOUNTER — Encounter (HOSPITAL_COMMUNITY): Payer: Self-pay | Admitting: Internal Medicine

## 2020-02-15 ENCOUNTER — Encounter (HOSPITAL_COMMUNITY): Payer: Self-pay | Admitting: Physical Medicine & Rehabilitation

## 2020-02-15 ENCOUNTER — Inpatient Hospital Stay (HOSPITAL_COMMUNITY)
Admission: RE | Admit: 2020-02-15 | Payer: Medicare Other | Source: Intra-hospital | Admitting: Physical Medicine & Rehabilitation

## 2020-02-15 DIAGNOSIS — Z886 Allergy status to analgesic agent status: Secondary | ICD-10-CM

## 2020-02-15 DIAGNOSIS — F0390 Unspecified dementia without behavioral disturbance: Secondary | ICD-10-CM | POA: Diagnosis present

## 2020-02-15 DIAGNOSIS — Z841 Family history of disorders of kidney and ureter: Secondary | ICD-10-CM

## 2020-02-15 DIAGNOSIS — E1165 Type 2 diabetes mellitus with hyperglycemia: Secondary | ICD-10-CM | POA: Diagnosis present

## 2020-02-15 DIAGNOSIS — S72002D Fracture of unspecified part of neck of left femur, subsequent encounter for closed fracture with routine healing: Secondary | ICD-10-CM

## 2020-02-15 DIAGNOSIS — D72829 Elevated white blood cell count, unspecified: Secondary | ICD-10-CM | POA: Diagnosis present

## 2020-02-15 DIAGNOSIS — J9601 Acute respiratory failure with hypoxia: Secondary | ICD-10-CM | POA: Diagnosis present

## 2020-02-15 DIAGNOSIS — Z7902 Long term (current) use of antithrombotics/antiplatelets: Secondary | ICD-10-CM

## 2020-02-15 DIAGNOSIS — R339 Retention of urine, unspecified: Secondary | ICD-10-CM | POA: Diagnosis present

## 2020-02-15 DIAGNOSIS — E11319 Type 2 diabetes mellitus with unspecified diabetic retinopathy without macular edema: Secondary | ICD-10-CM | POA: Diagnosis present

## 2020-02-15 DIAGNOSIS — Z888 Allergy status to other drugs, medicaments and biological substances status: Secondary | ICD-10-CM

## 2020-02-15 DIAGNOSIS — E1122 Type 2 diabetes mellitus with diabetic chronic kidney disease: Secondary | ICD-10-CM | POA: Diagnosis present

## 2020-02-15 DIAGNOSIS — R269 Unspecified abnormalities of gait and mobility: Secondary | ICD-10-CM | POA: Diagnosis present

## 2020-02-15 DIAGNOSIS — G4700 Insomnia, unspecified: Secondary | ICD-10-CM | POA: Diagnosis present

## 2020-02-15 DIAGNOSIS — I5032 Chronic diastolic (congestive) heart failure: Secondary | ICD-10-CM | POA: Diagnosis present

## 2020-02-15 DIAGNOSIS — I2510 Atherosclerotic heart disease of native coronary artery without angina pectoris: Secondary | ICD-10-CM | POA: Diagnosis present

## 2020-02-15 DIAGNOSIS — M109 Gout, unspecified: Secondary | ICD-10-CM | POA: Diagnosis present

## 2020-02-15 DIAGNOSIS — Z96632 Presence of left artificial wrist joint: Secondary | ICD-10-CM | POA: Diagnosis present

## 2020-02-15 DIAGNOSIS — Z853 Personal history of malignant neoplasm of breast: Secondary | ICD-10-CM

## 2020-02-15 DIAGNOSIS — I6200 Nontraumatic subdural hemorrhage, unspecified: Secondary | ICD-10-CM | POA: Diagnosis present

## 2020-02-15 DIAGNOSIS — E1159 Type 2 diabetes mellitus with other circulatory complications: Secondary | ICD-10-CM | POA: Diagnosis not present

## 2020-02-15 DIAGNOSIS — N179 Acute kidney failure, unspecified: Secondary | ICD-10-CM | POA: Diagnosis not present

## 2020-02-15 DIAGNOSIS — Z8673 Personal history of transient ischemic attack (TIA), and cerebral infarction without residual deficits: Secondary | ICD-10-CM

## 2020-02-15 DIAGNOSIS — G2581 Restless legs syndrome: Secondary | ICD-10-CM | POA: Diagnosis present

## 2020-02-15 DIAGNOSIS — K5900 Constipation, unspecified: Secondary | ICD-10-CM | POA: Diagnosis present

## 2020-02-15 DIAGNOSIS — K922 Gastrointestinal hemorrhage, unspecified: Secondary | ICD-10-CM | POA: Diagnosis not present

## 2020-02-15 DIAGNOSIS — I130 Hypertensive heart and chronic kidney disease with heart failure and stage 1 through stage 4 chronic kidney disease, or unspecified chronic kidney disease: Secondary | ICD-10-CM | POA: Diagnosis present

## 2020-02-15 DIAGNOSIS — E118 Type 2 diabetes mellitus with unspecified complications: Secondary | ICD-10-CM | POA: Diagnosis not present

## 2020-02-15 DIAGNOSIS — Z9049 Acquired absence of other specified parts of digestive tract: Secondary | ICD-10-CM

## 2020-02-15 DIAGNOSIS — I2699 Other pulmonary embolism without acute cor pulmonale: Secondary | ICD-10-CM | POA: Diagnosis not present

## 2020-02-15 DIAGNOSIS — I951 Orthostatic hypotension: Secondary | ICD-10-CM | POA: Diagnosis not present

## 2020-02-15 DIAGNOSIS — W182XXD Fall in (into) shower or empty bathtub, subsequent encounter: Secondary | ICD-10-CM | POA: Diagnosis not present

## 2020-02-15 DIAGNOSIS — Z981 Arthrodesis status: Secondary | ICD-10-CM

## 2020-02-15 DIAGNOSIS — I959 Hypotension, unspecified: Secondary | ICD-10-CM | POA: Diagnosis present

## 2020-02-15 DIAGNOSIS — K219 Gastro-esophageal reflux disease without esophagitis: Secondary | ICD-10-CM | POA: Diagnosis present

## 2020-02-15 DIAGNOSIS — Z833 Family history of diabetes mellitus: Secondary | ICD-10-CM

## 2020-02-15 DIAGNOSIS — N184 Chronic kidney disease, stage 4 (severe): Secondary | ICD-10-CM | POA: Diagnosis present

## 2020-02-15 DIAGNOSIS — R578 Other shock: Secondary | ICD-10-CM | POA: Diagnosis not present

## 2020-02-15 DIAGNOSIS — N189 Chronic kidney disease, unspecified: Secondary | ICD-10-CM | POA: Diagnosis present

## 2020-02-15 DIAGNOSIS — Z90710 Acquired absence of both cervix and uterus: Secondary | ICD-10-CM

## 2020-02-15 DIAGNOSIS — F419 Anxiety disorder, unspecified: Secondary | ICD-10-CM | POA: Diagnosis present

## 2020-02-15 DIAGNOSIS — D62 Acute posthemorrhagic anemia: Secondary | ICD-10-CM | POA: Diagnosis present

## 2020-02-15 DIAGNOSIS — E785 Hyperlipidemia, unspecified: Secondary | ICD-10-CM | POA: Diagnosis present

## 2020-02-15 DIAGNOSIS — IMO0002 Reserved for concepts with insufficient information to code with codable children: Secondary | ICD-10-CM | POA: Diagnosis present

## 2020-02-15 DIAGNOSIS — Z923 Personal history of irradiation: Secondary | ICD-10-CM

## 2020-02-15 DIAGNOSIS — E1140 Type 2 diabetes mellitus with diabetic neuropathy, unspecified: Secondary | ICD-10-CM | POA: Diagnosis present

## 2020-02-15 DIAGNOSIS — E11610 Type 2 diabetes mellitus with diabetic neuropathic arthropathy: Secondary | ICD-10-CM | POA: Diagnosis present

## 2020-02-15 DIAGNOSIS — Z79899 Other long term (current) drug therapy: Secondary | ICD-10-CM

## 2020-02-15 DIAGNOSIS — Z96652 Presence of left artificial knee joint: Secondary | ICD-10-CM | POA: Diagnosis present

## 2020-02-15 DIAGNOSIS — Z7984 Long term (current) use of oral hypoglycemic drugs: Secondary | ICD-10-CM

## 2020-02-15 DIAGNOSIS — R109 Unspecified abdominal pain: Secondary | ICD-10-CM

## 2020-02-15 DIAGNOSIS — Z96642 Presence of left artificial hip joint: Secondary | ICD-10-CM | POA: Diagnosis present

## 2020-02-15 DIAGNOSIS — R062 Wheezing: Secondary | ICD-10-CM

## 2020-02-15 DIAGNOSIS — Z885 Allergy status to narcotic agent status: Secondary | ICD-10-CM

## 2020-02-15 DIAGNOSIS — R579 Shock, unspecified: Secondary | ICD-10-CM | POA: Diagnosis not present

## 2020-02-15 DIAGNOSIS — Z8249 Family history of ischemic heart disease and other diseases of the circulatory system: Secondary | ICD-10-CM

## 2020-02-15 DIAGNOSIS — Z85820 Personal history of malignant melanoma of skin: Secondary | ICD-10-CM

## 2020-02-15 DIAGNOSIS — Z955 Presence of coronary angioplasty implant and graft: Secondary | ICD-10-CM

## 2020-02-15 LAB — BASIC METABOLIC PANEL
Anion gap: 7 (ref 5–15)
BUN: 47 mg/dL — ABNORMAL HIGH (ref 8–23)
CO2: 27 mmol/L (ref 22–32)
Calcium: 8.5 mg/dL — ABNORMAL LOW (ref 8.9–10.3)
Chloride: 106 mmol/L (ref 98–111)
Creatinine, Ser: 1.35 mg/dL — ABNORMAL HIGH (ref 0.44–1.00)
GFR, Estimated: 39 mL/min — ABNORMAL LOW (ref >60)
Glucose, Bld: 180 mg/dL — ABNORMAL HIGH (ref 70–99)
Potassium: 3.4 mmol/L — ABNORMAL LOW (ref 3.5–5.1)
Sodium: 140 mmol/L (ref 135–145)

## 2020-02-15 LAB — CBC
HCT: 28.3 % — ABNORMAL LOW (ref 36.0–46.0)
Hemoglobin: 8.9 g/dL — ABNORMAL LOW (ref 12.0–15.0)
MCH: 28.3 pg (ref 26.0–34.0)
MCHC: 31.4 g/dL (ref 30.0–36.0)
MCV: 90.1 fL (ref 80.0–100.0)
Platelets: 243 10*3/uL (ref 150–400)
RBC: 3.14 MIL/uL — ABNORMAL LOW (ref 3.87–5.11)
RDW: 14.9 % (ref 11.5–15.5)
WBC: 12.8 10*3/uL — ABNORMAL HIGH (ref 4.0–10.5)
nRBC: 0 % (ref 0.0–0.2)

## 2020-02-15 LAB — GLUCOSE, CAPILLARY
Glucose-Capillary: 211 mg/dL — ABNORMAL HIGH (ref 70–99)
Glucose-Capillary: 164 mg/dL — ABNORMAL HIGH (ref 70–99)
Glucose-Capillary: 153 mg/dL — ABNORMAL HIGH (ref 70–99)
Glucose-Capillary: 153 mg/dL — ABNORMAL HIGH (ref 70–99)

## 2020-02-15 MED ORDER — POLYETHYLENE GLYCOL 3350 17 G PO PACK
17.0000 g | PACK | Freq: Every day | ORAL | Status: DC | PRN
  Administered 2020-02-16: 17 g via ORAL
  Filled 2020-02-15: qty 1

## 2020-02-15 MED ORDER — GUAIFENESIN-DM 100-10 MG/5ML PO SYRP: 5.0000 mL | ORAL_SOLUTION | Freq: Four times a day (QID) | ORAL | Status: DC | PRN

## 2020-02-15 MED ORDER — MIRTAZAPINE 15 MG PO TABS
15.0000 mg | ORAL_TABLET | Freq: Every day | ORAL | Status: DC
  Administered 2020-02-16: 15 mg via ORAL
  Filled 2020-02-15: qty 1

## 2020-02-15 MED ORDER — CLOPIDOGREL BISULFATE 75 MG PO TABS: 75.0000 mg | ORAL_TABLET | Freq: Every day | ORAL | 0 refills | Status: DC

## 2020-02-15 MED ORDER — BUMETANIDE 2 MG PO TABS
2.0000 mg | ORAL_TABLET | Freq: Every day | ORAL | Status: DC
  Administered 2020-02-16: 2 mg via ORAL
  Filled 2020-02-15 (×2): qty 1

## 2020-02-15 MED ORDER — PROCHLORPERAZINE 25 MG RE SUPP: 12.5000 mg | Freq: Four times a day (QID) | RECTAL | Status: DC | PRN

## 2020-02-15 MED ORDER — BISACODYL 10 MG RE SUPP: 10.0000 mg | Freq: Every day | RECTAL | Status: DC | PRN

## 2020-02-15 MED ORDER — CEREFOLIN NAC 6-2-600 MG PO TABS
1.0000 | ORAL_TABLET | Freq: Every morning | ORAL | Status: DC
  Administered 2020-02-16: 1 via ORAL
  Filled 2020-02-15 (×2): qty 1

## 2020-02-15 MED ORDER — CHLORHEXIDINE GLUCONATE CLOTH 2 % EX PADS: 6.0000 | MEDICATED_PAD | Freq: Every day | CUTANEOUS | Status: DC

## 2020-02-15 MED ORDER — PROCHLORPERAZINE EDISYLATE 10 MG/2ML IJ SOLN: 5.0000 mg | Freq: Four times a day (QID) | INTRAMUSCULAR | Status: DC | PRN

## 2020-02-15 MED ORDER — LIDOCAINE HCL URETHRAL/MUCOSAL 2 % EX GEL: CUTANEOUS | Status: DC | PRN

## 2020-02-15 MED ORDER — MENTHOL 3 MG MT LOZG: 1.0000 | LOZENGE | OROMUCOSAL | Status: DC | PRN

## 2020-02-15 MED ORDER — PHENOL 1.4 % MT LIQD: 1.0000 | OROMUCOSAL | Status: DC | PRN

## 2020-02-15 MED ORDER — METOPROLOL SUCCINATE ER 25 MG PO TB24
12.5000 mg | ORAL_TABLET | Freq: Every day | ORAL | Status: DC
  Administered 2020-02-16: 12.5 mg via ORAL
  Filled 2020-02-15: qty 1

## 2020-02-15 MED ORDER — TRAZODONE HCL 50 MG PO TABS
25.0000 mg | ORAL_TABLET | Freq: Every evening | ORAL | Status: DC | PRN
  Administered 2020-02-16: 50 mg via ORAL
  Filled 2020-02-15: qty 1

## 2020-02-15 MED ORDER — POLYETHYLENE GLYCOL 3350 17 G PO PACK: 17.0000 g | PACK | Freq: Every day | ORAL | Status: DC | PRN

## 2020-02-15 MED ORDER — DOCUSATE SODIUM 100 MG PO CAPS
100.0000 mg | ORAL_CAPSULE | Freq: Two times a day (BID) | ORAL | Status: DC
  Administered 2020-02-15 – 2020-02-16 (×2): 100 mg via ORAL
  Filled 2020-02-15 (×3): qty 1

## 2020-02-15 MED ORDER — HYDROCODONE-ACETAMINOPHEN 5-325 MG PO TABS: 1.0000 | ORAL_TABLET | Freq: Four times a day (QID) | ORAL | Status: DC | PRN

## 2020-02-15 MED ORDER — METHOCARBAMOL 500 MG PO TABS: 500.0000 mg | ORAL_TABLET | Freq: Four times a day (QID) | ORAL | Status: DC | PRN

## 2020-02-15 MED ORDER — ADULT MULTIVITAMIN W/MINERALS CH
1.0000 | ORAL_TABLET | Freq: Every day | ORAL | Status: DC
  Administered 2020-02-16: 1 via ORAL
  Filled 2020-02-15: qty 1

## 2020-02-15 MED ORDER — BETHANECHOL CHLORIDE 25 MG PO TABS: 25.0000 mg | ORAL_TABLET | Freq: Three times a day (TID) | ORAL | 0 refills | Status: AC

## 2020-02-15 MED ORDER — ROSUVASTATIN CALCIUM 20 MG PO TABS
20.0000 mg | ORAL_TABLET | Freq: Every day | ORAL | Status: DC
  Administered 2020-02-16: 20 mg via ORAL
  Filled 2020-02-15: qty 1

## 2020-02-15 MED ORDER — ALUM & MAG HYDROXIDE-SIMETH 200-200-20 MG/5ML PO SUSP
30.0000 mL | ORAL | Status: DC | PRN
  Administered 2020-02-16: 30 mL via ORAL
  Filled 2020-02-15: qty 30

## 2020-02-15 MED ORDER — ASPIRIN 81 MG PO CHEW
81.0000 mg | CHEWABLE_TABLET | Freq: Two times a day (BID) | ORAL | Status: DC
  Administered 2020-02-15 – 2020-02-16 (×3): 81 mg via ORAL
  Filled 2020-02-15 (×3): qty 1

## 2020-02-15 MED ORDER — ACETAMINOPHEN 325 MG PO TABS: 325.0000 mg | ORAL_TABLET | ORAL | Status: DC | PRN

## 2020-02-15 MED ORDER — DIPHENHYDRAMINE HCL 12.5 MG/5ML PO ELIX: 12.5000 mg | ORAL_SOLUTION | Freq: Four times a day (QID) | ORAL | Status: DC | PRN

## 2020-02-15 MED ORDER — EXEMESTANE 25 MG PO TABS
25.0000 mg | ORAL_TABLET | Freq: Every day | ORAL | Status: DC
  Administered 2020-02-16: 25 mg via ORAL
  Filled 2020-02-15 (×2): qty 1

## 2020-02-15 MED ORDER — FLEET ENEMA 7-19 GM/118ML RE ENEM: 1.0000 | ENEMA | Freq: Once | RECTAL | Status: DC | PRN

## 2020-02-15 MED ORDER — FEBUXOSTAT 40 MG PO TABS
40.0000 mg | ORAL_TABLET | Freq: Every day | ORAL | Status: DC
  Administered 2020-02-16: 40 mg via ORAL
  Filled 2020-02-15 (×2): qty 1

## 2020-02-15 MED ORDER — PROCHLORPERAZINE MALEATE 5 MG PO TABS
5.0000 mg | ORAL_TABLET | Freq: Four times a day (QID) | ORAL | Status: DC | PRN
  Administered 2020-02-16: 10 mg via ORAL
  Filled 2020-02-15 (×2): qty 2

## 2020-02-15 MED ORDER — NITROGLYCERIN 0.4 MG SL SUBL: 0.4000 mg | SUBLINGUAL_TABLET | SUBLINGUAL | Status: DC | PRN

## 2020-02-15 MED ORDER — GLUCERNA SHAKE PO LIQD: 237.0000 mL | Freq: Three times a day (TID) | ORAL | 0 refills | Status: DC

## 2020-02-15 MED ORDER — GLUCERNA SHAKE PO LIQD
237.0000 mL | Freq: Three times a day (TID) | ORAL | Status: DC
  Administered 2020-02-16 (×2): 237 mL via ORAL

## 2020-02-15 MED ORDER — DIAZEPAM 2 MG PO TABS
5.0000 mg | ORAL_TABLET | Freq: Three times a day (TID) | ORAL | Status: DC | PRN
  Administered 2020-02-16: 5 mg via ORAL
  Filled 2020-02-15: qty 3

## 2020-02-15 MED ORDER — PRAMIPEXOLE DIHYDROCHLORIDE 0.25 MG PO TABS
0.7500 mg | ORAL_TABLET | Freq: Every day | ORAL | Status: DC
  Administered 2020-02-15 – 2020-02-16 (×2): 0.75 mg via ORAL
  Filled 2020-02-15 (×2): qty 3

## 2020-02-15 MED ORDER — INSULIN ASPART 100 UNIT/ML ~~LOC~~ SOLN: 0.0000 [IU] | Freq: Every day | SUBCUTANEOUS | Status: DC

## 2020-02-15 MED ORDER — INSULIN ASPART 100 UNIT/ML ~~LOC~~ SOLN
0.0000 [IU] | Freq: Three times a day (TID) | SUBCUTANEOUS | Status: DC
  Administered 2020-02-16 (×3): 2 [IU] via SUBCUTANEOUS

## 2020-02-15 MED ORDER — LINAGLIPTIN 5 MG PO TABS
5.0000 mg | ORAL_TABLET | Freq: Every day | ORAL | Status: DC
  Administered 2020-02-16: 5 mg via ORAL
  Filled 2020-02-15: qty 1

## 2020-02-15 MED ORDER — BETHANECHOL CHLORIDE 25 MG PO TABS
25.0000 mg | ORAL_TABLET | Freq: Three times a day (TID) | ORAL | Status: DC
  Administered 2020-02-16 (×3): 25 mg via ORAL
  Filled 2020-02-15 (×3): qty 1

## 2020-02-15 MED ORDER — ONDANSETRON HCL 4 MG/2ML IJ SOLN
4.0000 mg | Freq: Four times a day (QID) | INTRAMUSCULAR | Status: DC | PRN
  Administered 2020-02-15 – 2020-02-17 (×4): 4 mg via INTRAVENOUS
  Filled 2020-02-15 (×4): qty 2

## 2020-02-15 MED ORDER — CLOPIDOGREL BISULFATE 75 MG PO TABS
75.0000 mg | ORAL_TABLET | Freq: Every day | ORAL | Status: DC
  Administered 2020-02-16: 75 mg via ORAL
  Filled 2020-02-15: qty 1

## 2020-02-15 NOTE — Progress Notes (Signed)
Raechel Ache, OT  Rehab Admission Coordinator  Physical Medicine and Rehabilitation  PMR Pre-admission     Signed  Date of Service:  02/12/2020  3:12 PM      Related encounter: ED to Hosp-Admission (Discharged) from 02/08/2020 in [facility]       Show:Clear all _0 Manual_1 Template_2 Copied  Added by: _3 Raechel Ache, OT  _4 Hover for details PMR Admission Coordinator Pre-Admission Assessment   Patient: [name] is an 79 y.o., female MRN: [id number] DOB: 21-Mar-1941 Height: _5  (167.6 cm) Weight: 89.4 kg                                                                                                                                                  Insurance Information HMO:     PPO:      PCP:      IPA:      80/20: yes     OTHER:  PRIMARY: Medicare A and B      Policy#: [id number]      Subscriber: patient CM Name:       Phone#:      Fax#:  Pre-Cert#:       Employer:  Benefits:  Phone #: online     Name: verified eligibility online via [place] on 02/13/20 Eff. Date: Part A and B effective 07/28/2003     Deduct: $1,484      Out of Pocket Max: NA      Life Max: NA  CIR: Covered per Medicare guidelines once yearly deductible has bene met      SNF: days 1-20, 100%, days 21-100, 80% Outpatient: 80%     Co-Pay: 20% Home Health: 100%      Co-Pay:  DME: 80%     Co-Pay: 20% Providers: Pt's choice SECONDARY: [facility]      Policy#: [id number]      Phone#: [phone]   Financial Counselor:       Phone#:    The "Data Collection Information Summary" for patients in Inpatient Rehabilitation Facilities with attached "Privacy Act [place] Records" was provided and verbally reviewed with: Family   Emergency Contact Information         Contact Information     Name Relation Home Work Mobile    Jimerson,Jerry Spouse [phone]   [phone]    Lynne Leader Daughter     [phone]    Hiatt,Elizabeth Daughter     [phone]        Current Medical History  Patient Admitting Diagnosis: Left hip fracture   History of Present Illness: Pt is an 79 you female with PMH of CAD with PCI/stent, CKD stage IV, type 2 DM, chronic diastolic CHF, chronic BLE edema. Pt presented to [facility] on [facility] after a fall while getting out of the shower. Work up revealed an  acute displaced angulated left femoral neck fracture and also showed a contusion/adbraion to the posterior scalp. MRI showed a thin acute 9m subdural hematoma in the right frontal lobe. Neurosurgery was consulted with no surgical issues currently. PT developed acute respiratory failure post operatively requiring a facemask with 4L. Pt titrated down to 1L currently. Chest x-[place] showed mild pulmonary vascular congestion. Pt also developed acute postoperative urinary retention requiring foley catheter placed 02/14/20. Pt did have some acute blood loss anemia and received 1 unit PRBC with hemoglobin monitored daily. Pt was evaluated by therapies with recommendation for CIR. Pt is to admit to CIR on 02/15/20.  Glasgow Coma Scale Score: 15   Past Medical History      Past Medical History:  Diagnosis Date  . Coronary artery disease    . Diabetes mellitus without complication ([place]        Family History  Family history is unknown by patient.   Prior Rehab/Hospitalizations:  Has the patient had prior rehab or hospitalizations prior to admission? No   Has the patient had major surgery during 100 days prior to admission? Yes   Current Medications    Current Facility-Administered Medications:  .  0.9 %  sodium chloride infusion (Manually program via Guardrails IV Fluids), , Intravenous, Once, JMargy ClarksM, PA-C .  acetaminophen (TYLENOL) tablet 650 mg, 650 mg, Oral, Q6H PRN, JRachael Fee PA-C, 650 mg at 02/10/20 0703 .  aspirin chewable tablet 81 mg, 81 mg, Oral, BID, JRachael Fee PA-C, 81 mg at 02/15/20 1006 .  bethanechol (URECHOLINE) tablet 25 mg, 25 mg,  Oral, TID, [place] EDonnamarie Poag DO, 25 mg at 02/15/20 1003 .  bisacodyl (DULCOLAX) suppository 10 mg, 10 mg, Rectal, Daily PRN, JMargy ClarksM, PA-C .  bumetanide (BUMEX) tablet 2 mg, 2 mg, Oral, Daily, [place] EDonnamarie Poag DO, 2 mg at 02/15/20 1015 .  Cerefolin NAC 6-2-600 MG TABS 1 tablet, 1 tablet, Oral, q AM, [place] EDonnamarie Poag DO, Given at 02/15/20 [phone].  Chlorhexidine Gluconate Cloth 2 % PADS 6 each, 6 each, Topical, Daily, JRachael Fee [place] 6 each at 02/15/20 1028 .  clopidogrel (PLAVIX) tablet 75 mg, 75 mg, Oral, Daily, MKris Mouton RPH, 75 mg at 02/15/20 1005 .  diazepam (VALIUM) tablet 5 mg, 5 mg, Oral, TID PRN, [place] EDonnamarie Poag DO, 5 mg at 02/14/20 1007 .  docusate sodium (COLACE) capsule 100 mg, 100 mg, Oral, BID, JMargy ClarksM, PA-C, 100 mg at 02/15/20 1005 .  feeding supplement (GLUCERNA SHAKE) (GLUCERNA SHAKE) liquid 237 mL, 237 mL, Oral, TID BM, JMargy ClarksM, PA-C, 237 mL at 02/15/20 1014 .  HYDROcodone-acetaminophen (NORCO/VICODIN) 5-325 MG per tablet 1-2 tablet, 1-2 tablet, Oral, Q6H PRN, JRachael Fee PA-C, 1 tablet at 02/14/20 2139 .  insulin aspart (novoLOG) injection 0-9 Units, 0-9 Units, Subcutaneous, TID AC & HS, [place] Eric J, DO, 2 Units at 02/15/20 1158 .  menthol-cetylpyridinium (CEPACOL) lozenge 3 mg, 1 lozenge, Oral, PRN **OR** phenol (CHLORASEPTIC) mouth spray 1 spray, 1 spray, Mouth/Throat, PRN, JMargy ClarksM, PA-C .  methocarbamol (ROBAXIN) tablet 500 mg, 500 mg, Oral, Q6H PRN, JMargy ClarksM, PA-C, 500 mg at 02/12/20 2200 .  metoCLOPramide (REGLAN) tablet 5-10 mg, 5-10 mg, Oral, Q8H PRN **OR** metoCLOPramide (REGLAN) injection 5-10 mg, 5-10 mg, Intravenous, Q8H PRN, JMargy ClarksM, PA-C .  metoprolol succinate (TOPROL-XL) 24 hr tablet 12.5 mg, 12.5 mg, Oral, Daily, JMargy ClarksM, PA-C, 12.5 mg at 02/15/20 1003 .  morphine 2 MG/ML injection 1 mg, 1 mg, Intravenous, Q2H PRN, Margy Clarks M, PA-C, 1 mg at 02/10/20 1110 .   multivitamin with minerals tablet 1 tablet, 1 tablet, Oral, Daily, Margy Clarks M, PA-C, 1 tablet at 02/15/20 1006 .  naproxen (NAPROSYN) tablet 250 mg, 250 mg, Oral, BID WC, Margy Clarks M, PA-C, 250 mg at 02/15/20 2725 .  ondansetron (ZOFRAN) injection 4 mg, 4 mg, Intravenous, Q6H PRN, Margy Clarks M, PA-C .  polyethylene glycol (MIRALAX / GLYCOLAX) packet 17 g, 17 g, Oral, Daily PRN, [place] Eric J, DO .  rosuvastatin (CRESTOR) tablet 20 mg, 20 mg, Oral, Daily, [place] Donnamarie Poag, DO, 20 mg at 02/15/20 1005 .  senna-docusate (Senokot-S) tablet 1 tablet, 1 tablet, Oral, QHS PRN, Rachael Fee, PA-C   Patients Current Diet:     Diet Order                      Diet Carb Modified Fluid consistency: Thin; Room service appropriate? Yes  Diet effective now                      Precautions / Restrictions Precautions Precautions: Fall, Posterior Hip Precaution Booklet Issued: No Precaution Comments: Reviewed hip precautions- needs handout. 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 Restrictions Weight Bearing Restrictions: No LLE Weight Bearing: Weight bearing as tolerated Other Position/Activity Restrictions: posterior hip precautions    Has the patient had 2 or more falls or a fall with injury in the past year?Yes   Prior Activity Level Community (5-7x/wk): pretty active PTA, using 4 pt cane. 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 Pt reports use of cane or furniture as RW does not fit inher house   Self Care: Did the patient need help bathing, dressing, using the toilet or eating?  Independent   Indoor Mobility: Did the patient need assistance with walking from  room to room (with or without device)? Independent   Stairs: Did the patient need assistance with internal or external stairs (with or without device)? Independent   Functional Cognition: Did the patient need help planning regular tasks such as shopping or remembering to take medications? Needed some help   Home Assistive Devices / [place] Devices/Equipment: None   Prior Device Use: Indicate devices/aids used by the patient prior to current illness, exacerbation or injury? has 4pt cane (has but does not use WC or RW)   Current Functional Level Cognition   Overall Cognitive Status: No family/caregiver present to determine baseline cognitive functioning Orientation Level: Oriented X4 General Comments: Remains anxious with dyspnea with mobility, also unable to state hip precuations, otherwise oriented and following commands    Extremity Assessment (includes Sensation/Coordination)   Upper Extremity Assessment: Defer to OT evaluation LUE Deficits / Details: ROM limited to 90 degrees flexion. Strength:  shoulder 2/5, biceps/triceps 4/5, grip 4/5 LUE: Unable to fully assess due to pain LUE Sensation: WNL LUE Coordination: decreased gross motor  Lower Extremity Assessment: Generalized weakness, RLE deficits/detail, LLE deficits/detail RLE Deficits / Details: generalized weakness, able to move through full ROM, charcot foot amputation RLE Sensation: decreased light touch (no feeling below ankle) LLE  Deficits / Details: limited due to pain, partial ROM against gravity. limited by pain LLE: Unable to fully assess due to pain LLE Sensation: decreased light touch (no sensation below ankle)     ADLs   Overall ADL's : Needs assistance/impaired Eating/Feeding: Set up, Sitting, Cueing for safety Grooming: Wash/dry hands, Wash/dry face, Oral care, Set up, Bed level Grooming Details (indicate cue type and reason): Pt unable to stand at sink at this time. EOB only due to pain. Upper  Body Bathing: Set up, Bed level, Cueing for safety Lower Body Bathing: Total assistance, +2 for physical assistance, Sitting/lateral leans, Adhering to hip precautions Lower Body Bathing Details (indicate cue type and reason): Pt with posterior hip precuations that she does not yet understand.  Unable to get into complete standing due to pain and increased heart rate.  Upper Body Dressing : Minimal assistance, Sitting, Cueing for compensatory techniques Lower Body Dressing: Total assistance, +2 for physical assistance, Adhering to hip precautions, Sit to/from stand, Cueing for compensatory techniques Lower Body Dressing Details (indicate cue type and reason): Pt requires further education with hip precuations and introduction to adaptive equipment. Toilet Transfer Details (indicate cue type and reason): attempted to stand to get to [facility]. Pt with increased HR to 126 and decreased O2 sats to 85% despite increasing O2 earlier in session to 3L. Toileting- Clothing Manipulation and Hygiene: Total assistance, +2 for physical assistance, Adhering to hip precautions, Sitting/lateral lean Functional mobility during ADLs: Moderate assistance, +2 for physical assistance, +2 for safety/equipment General ADL Comments: Pt unable to stand or complete any LE adls or toileting adls due to pain, and dizziness, stating she just doesnt feel like herself, BP more stable in session to date     Mobility   Overal bed mobility: Needs Assistance Bed Mobility: Supine to Sit, Sit to Supine Supine to sit: Max assist, +2 for physical assistance Sit to supine: Total assist, +2 for physical assistance General bed mobility comments: Up in chair upon arrival.     Transfers   Overall transfer level: Needs assistance Equipment used: Rolling walker (2 wheeled) Transfers: Sit to/from Stand Sit to Stand: Mod assist General transfer comment: Assist to power to standing with cues for hand placement, slow to rise, cues to stand fully  upright, stated dizziness but BP improved with O2 remaining stable at 1L     Ambulation / Gait / Stairs / Wheelchair Mobility   Ambulation/Gait Ambulation/Gait assistance: Herbalist (Feet): 3 Feet Assistive device: Rolling walker (2 wheeled) Gait Pattern/deviations: Step-through pattern, Decreased stride length General Gait Details: pt with limited lateral stepping to bed from recliner. Reports 7/10 fatigue but also stating she would like to walk to bathroom.  Gait velocity interpretation: <1.31 ft/sec, indicative of household ambulator     Posture / Balance Dynamic Sitting Balance Sitting balance - Comments: Limited unsupported sitting tolerance. Balance Overall balance assessment: Needs assistance Sitting-balance support: Feet supported, No upper extremity supported Sitting balance-Leahy Scale: Fair Sitting balance - Comments: Limited unsupported sitting tolerance. Standing balance support: During functional activity Standing balance-Leahy Scale: Poor Standing balance comment: Requires UE support in standing with flexed posture.     Special needs/care consideration Oxygen: 1L [place], Skin: Left hip surgical incision, Diabetic management: yes and Behavioral consideration: mild cognitive deficits at baseline (husband reports dementia).         Previous Home Environment (from acute therapy documentation) Living Arrangements: Spouse/significant other Home Care Services: No Additional Comments: Pt lives in ones story home with 3  steps to enter, has both tub-shower and walk in shower with built-in seats and grab bars. Has a RW and Cane   Discharge Living Setting Plans for Discharge Living Setting: Patient's home, Lives with (comment) (lives with spouse) Type of Home at Discharge: House Discharge Home Layout: One level Discharge Home Access: Stairs to enter, Ramped entrance Entrance Stairs-Rails: Can reach both Entrance Stairs-Number of Steps: 4 Discharge Bathroom  Shower/Tub: Walk-in shower Discharge Bathroom Toilet: Standard Discharge Bathroom Accessibility: Yes How Accessible: Accessible via walker Does the patient have any problems obtaining your medications?: No   Social/Family/Support Systems Patient Roles: Spouse Contact Information: [name] (spouse) [phone] Anticipated Caregiver: [name] + grandchild Hospital doctor) Anticipated Caregiver's Contact Information: see above Ability/Limitations of Caregiver: light Min A Caregiver Availability: Other (Comment) (close to 24/7) Discharge Plan Discussed with Primary Caregiver: Yes (pt and husband) Is Caregiver In Agreement with Plan?: Yes Does Caregiver/Family have Issues with Lodging/Transportation while Pt is in Rehab?: No     Goals Patient/Family Goal for Rehab: PT/OT: Min A; SLP: Mod I Expected length of stay: 21-25 days Cultural Considerations: NA Pt/Family Agrees to Admission and willing to participate: Yes Program Orientation Provided & Reviewed with Pt/Caregiver Including Roles  & Responsibilities: Yes (pt and husband)  Barriers to Discharge: Home environment access/layout, Other (comments)  Barriers to Discharge Comments: home is tight fitting for wc; pt has cognitive impairments at baseline (form of dementia per husband)     Decrease burden of Care through IP rehab admission: OtherNA     Possible need for SNF placement upon discharge:Not anticipated; Family are supportive and understand her anticipated goals and feel they can accommodate those needs.      Patient Condition: This patient's medical and functional status has changed since the consult dated: 02/11/20 in which the Rehabilitation Physician determined and documented that the patient's condition is appropriate for intensive rehabilitative care in an inpatient rehabilitation facility. See "History of Present Illness" (above) for medical update. Functional changes are: improvement in trasnfers from Total A +2 to Mod A, and pt has now  progressed from no gait to Min A 3 feet. Patient's medical and functional status update has been discussed with the Rehabilitation physician and patient remains appropriate for inpatient rehabilitation. Will admit to inpatient rehab today.   Preadmission Screen Completed By:  Raechel Ache, OT, 02/15/2020 12:13 PM ______________________________________________________________________   Discussed status with Dr. Ranell Patrick on 02/15/20 at 12:13PM and received approval for admission today.   Admission Coordinator:  Raechel Ache, time 12:13PM/Date11/19/21.             Cosigned by: Izora Ribas, MD at 02/15/2020 12:14 PM  Revision History                Note Details  Author Raechel Ache, OT File Time 02/15/2020 12:13 PM  Author Type Rehab Admission Coordinator Status Signed  Last Editor Raechel Ache, [place] [facility] and [place] # [phone] Admit Date 02/15/2020

## 2020-02-15 NOTE — Progress Notes (Signed)
Kristen Blake, MD  Physician  Physical Medicine and Rehabilitation  Consult Note     Signed  Date of Service:  02/11/2020  1:34 PM      Related encounter: ED to Hosp-Admission (Discharged) from 02/08/2020 in [place] All Collapse All           Physical Medicine and Rehabilitation Consult Reason for Consult: Altered mental status with decreased functional mobility Referring Physician: [facility]     HPI: [name] is a 79 y.o. right-handed female with history of CKD stage IV, diastolic congestive heart failure, Charcot foot, bilateral lower extremity edema, CAD with angioplasty maintained on Plavix as well as diabetes mellitus and hyperlipidemia.  Presented 02/08/2020 after mechanical fall at home trying to get out of the shower.  Denied loss of consciousness.  Admission chemistries BUN 29 creatinine 1.77, hemoglobin 13.3.  CT/MRI showed subdural hematoma overlying the anterior lateral right frontal lobe measuring up to 4 mm in thickness.  Suspected 5 mm incidental meningioma overlying the anterior left frontal lobe unchanged from previous tracings.  MRI of head and neck no intracranial large vessel occlusion.  Patient also sustained acute displaced angulated subcapital left femoral neck fracture.  Patient underwent bipolar hip arthroplasty 2020-02-10 per [name].  Weightbearing as tolerated.  Follow-up neurosurgery [name] in regards to SDH advised conservative care..  Echocardiogram with ejection fraction of 60 to 65% no wall motion abnormalities grade 1 diastolic dysfunction.  Patient was cleared to resume Plavix as prior to admission as well as low-dose aspirin 81 mg twice daily for DVT prophylaxis.  Therapy evaluations completed with recommendations of physical medicine rehab consult.   Pt lives with her husband who is 11 yrs old and does not drive at night Review of Systems  Constitutional: Negative for chills and  fever.  HENT: Negative for hearing loss.   Eyes: Negative for blurred vision and double vision.  Respiratory: Negative for cough and shortness of breath.   Cardiovascular: Positive for leg swelling. Negative for chest pain and palpitations.  Gastrointestinal: Positive for constipation. Negative for heartburn, nausea and vomiting.  Genitourinary: Negative for dysuria, flank pain and hematuria.  Musculoskeletal: Positive for joint pain and myalgias.  Skin: Negative for rash.  All other systems reviewed and are negative.       Past Medical History:  Diagnosis Date  . Coronary artery disease    . Diabetes mellitus without complication [facility])           Past Surgical History:  Procedure Laterality Date  . CARDIAC CATHETERIZATION      . CORONARY ANGIOPLASTY        Family History  Family history unknown: Yes    Social History:  reports that she has never smoked. She has never used smokeless tobacco. She reports that she does not drink alcohol and does not use drugs. Allergies: No Known Allergies       Medications Prior to Admission  Medication Sig Dispense Refill  . BIOTIN PO Take 1 tablet by mouth daily.      . Cholecalciferol (VITAMIN D) 50 MCG (2000 UT) tablet Take 2,000 Units by mouth daily.      . clopidogrel (PLAVIX) 75 MG tablet Take 75 mg by mouth daily.      . Coenzyme Q10 (COQ-10 PO) Take 1 tablet by mouth daily.      . colchicine 0.6 MG tablet Take 0.6  mg by mouth daily as needed (for up to 3 days for gout flare).      . CRANBERRY-CALCIUM PO Take 1 tablet by mouth daily.      . diazepam (VALIUM) 5 MG tablet Take 5 mg by mouth 3 (three) times daily as needed.      . febuxostat (ULORIC) 40 MG tablet Take 40 mg by mouth daily.      Marland Kitchen gabapentin (NEURONTIN) 300 MG capsule Take 300 mg by mouth 2 (two) times daily.       Marland Kitchen JANUVIA 25 MG tablet Take 25 mg by mouth daily.      Marland Kitchen KRILL OIL PO Take 1 capsule by mouth daily.      . Methylfol-Methylcob-Acetylcyst (CEREFOLIN NAC)  6-2-600 MG TABS Take 1 tablet by mouth in the morning.      . metolazone (ZAROXOLYN) 2.5 MG tablet Take 2.5 mg by mouth daily. 30 minutes before Bumetanide Mon. & Thurs.      . metoprolol succinate (TOPROL-XL) 25 MG 24 hr tablet Take 25 mg by mouth at bedtime.       . mirtazapine (REMERON) 15 MG tablet Take 15 mg by mouth at bedtime.       . nitroGLYCERIN (NITROLINGUAL) 0.4 MG/SPRAY spray Place 1 spray under the tongue every 5 (five) minutes x 3 doses as needed for chest pain.      . pramipexole (MIRAPEX) 0.25 MG tablet Take 0.75 mg by mouth at bedtime.       Marland Kitchen Resveratrol 250 MG CAPS Take 500 mg by mouth daily.      . rosuvastatin (CRESTOR) 20 MG tablet Take 20 mg by mouth daily.          Home: Home Living Family/patient expects to be discharged to:: Inpatient rehab Living Arrangements: Spouse/significant other Additional Comments: Pt lives in ones story home with 3 steps to enter, has both tub-shower and walk in shower with built-in seats and grab bars. Has a RW and Cane  Functional History: Prior Function Level of Independence: Independent with assistive device(s) (pt reports furniture walking or intermittent use of cane) Comments: Pt could not drive but was able to do most other adls other than cooking bc her husband does this. Pt reports use of cane or furniture as RW does not fit inher house Functional Status:  Mobility: Bed Mobility Overal bed mobility: Needs Assistance Bed Mobility: Supine to Sit, Sit to Supine Supine to sit: Max assist, +2 for physical assistance Sit to supine: Total assist, +2 for physical assistance General bed mobility comments: Pt initially assisted getting legs to EOB.  More assist needed to get to EOB and total assist to return to bed due to fatigued and O2 sats dropping. Transfers Overall transfer level: Needs assistance Equipment used: Rolling walker (2 wheeled), 2 person hand held assist Transfers: Sit to/from Stand Sit to Stand: Total assist, +2  physical assistance General transfer comment: Pt able to get about 50% of way up before needing to return to bed due to pain, HR and dropping O2 sats. blocking to R LE and strong assist to steady RW Ambulation/Gait General Gait Details: unsafe at this time due to pt inability to achieve complete stand   ADL: ADL Overall ADL's : Needs assistance/impaired Eating/Feeding: Set up, Sitting, Cueing for safety Grooming: Wash/dry hands, Wash/dry face, Oral care, Set up, Bed level Grooming Details (indicate cue type and reason): Pt unable to stand at sink at this time. EOB only due to pain. Upper Body Bathing:  Set up, Bed level, Cueing for safety Lower Body Bathing: Total assistance, +2 for physical assistance, Sitting/lateral leans, Adhering to hip precautions Lower Body Bathing Details (indicate cue type and reason): Pt with posterior hip precuations that she does not yet understand.  Unable to get into complete standing due to pain and increased heart rate.  Upper Body Dressing : Minimal assistance, Sitting, Cueing for compensatory techniques Lower Body Dressing: Total assistance, +2 for physical assistance, Adhering to hip precautions, Sit to/from stand, Cueing for compensatory techniques Lower Body Dressing Details (indicate cue type and reason): Pt requires further education with hip precuations and introduction to adaptive equipment. Toilet Transfer Details (indicate cue type and reason): attempted to stand to get to [facility]. Pt with increased HR to 126 and decreased O2 sats to 85% despite increasing O2 earlier in session to 3L. Toileting- Clothing Manipulation and Hygiene: Total assistance, +2 for physical assistance, Adhering to hip precautions, Sitting/lateral lean Functional mobility during ADLs: Maximal assistance, +2 for physical assistance General ADL Comments: Pt unable to stand or complete any LE adls or toileting adls due to pain, increased HR and decreased O2 sat rates.  Pt only has husband  to assist at home.  Husband, at times, overestimates what pt is able to to.     Cognition: Cognition Overall Cognitive Status: Within Functional Limits for tasks assessed Orientation Level: Oriented X4 Cognition Arousal/Alertness: Awake/alert Behavior During Therapy: Anxious Overall Cognitive Status: Within Functional Limits for tasks assessed General Comments: Pt may have some minor cognitive deficits. Pt very anxious during eval but seemed to be accurate with STM and safety questioning.   Blood pressure (!) 141/85, pulse 87, temperature 97.8 F (36.6 C), temperature source Oral, resp. rate (!) 22, height 5\' 6"  (1.676 m), weight 89.3 kg, SpO2 92 %. Physical Exam Vitals and nursing note reviewed.  Constitutional:      Appearance: She is obese.  HENT:     Head: Normocephalic and atraumatic.  Eyes:     Extraocular Movements: Extraocular movements intact.     Conjunctiva/sclera: Conjunctivae normal.     Pupils: Pupils are equal, round, and reactive to light.  Cardiovascular:     Rate and Rhythm: Normal rate and regular rhythm.     Heart sounds: Normal heart sounds.  Pulmonary:     Effort: Pulmonary effort is normal.     Breath sounds: Normal breath sounds.  Abdominal:     General: Abdomen is flat. There is no distension.     Palpations: Abdomen is soft.  Musculoskeletal:     Cervical back: Normal range of motion.  Skin:    General: Skin is warm and dry.     Comments: Hip incision is dressed.  Appropriately tender  Neurological:     Mental Status: She is alert and oriented to person, place, and time.     Motor: Weakness present.     Coordination: Coordination normal.     Comments: Patient is alert in no acute distress oriented to person place.  Follows commands  Psychiatric:        Mood and Affect: Mood normal.        Behavior: Behavior normal.        Thought Content: Thought content normal.        Judgment: Judgment normal.    motor strength 5/5 in bilateral delt,  Biceps, triceps, grip, 4/5 Right HF, KE, ADF, 0/5 Left HF KE 4- Left ankle DF (pain inhibition)   Lab Results Last 24 Hours  Results for orders placed or performed during the hospital encounter of 02/08/20 (from the past 24 hour(s))  Prepare RBC (crossmatch)     Status: None    Collection Time: 02/10/20  3:00 PM  Result Value Ref Range    Order Confirmation          ORDER PROCESSED BY BLOOD BANK Performed at [facility], [place] [address] [place], [place] [address]    Glucose, capillary     Status: Abnormal    Collection Time: 02/10/20  5:03 PM  Result Value Ref Range    Glucose-Capillary 141 (H) 70 - 99 mg/dL  Glucose, capillary     Status: Abnormal    Collection Time: 02/11/20 12:06 AM  Result Value Ref Range    Glucose-Capillary 245 (H) 70 - 99 mg/dL  Glucose, capillary     Status: Abnormal    Collection Time: 02/11/20  3:44 AM  Result Value Ref Range    Glucose-Capillary 221 (H) 70 - 99 mg/dL  CBC     Status: Abnormal    Collection Time: 02/11/20  6:23 AM  Result Value Ref Range    WBC 12.6 (H) 4.0 - 10.5 K/uL    RBC 4.11 3.87 - 5.11 MIL/uL    Hemoglobin 11.8 (L) 12.0 - 15.0 g/dL    HCT 37.3 36 - 46 %    MCV 90.8 80.0 - 100.0 fL    MCH 28.7 26.0 - 34.0 pg    MCHC 31.6 30.0 - 36.0 g/dL    RDW 14.8 11.5 - 15.5 %    Platelets 160 150 - 400 K/uL    nRBC 0.0 0.0 - 0.2 %  Basic metabolic panel     Status: Abnormal    Collection Time: 02/11/20  6:23 AM  Result Value Ref Range    Sodium 137 135 - 145 mmol/L    Potassium 4.7 3.5 - 5.1 mmol/L    Chloride 101 98 - 111 mmol/L    CO2 27 22 - 32 mmol/L    Glucose, Bld 213 (H) 70 - 99 mg/dL    BUN 24 (H) 8 - 23 mg/dL    Creatinine, Ser 1.56 (H) 0.44 - 1.00 mg/dL    Calcium 9.1 8.9 - 10.3 mg/dL    GFR, Estimated 33 (L) >60 mL/min    Anion gap 9 5 - 15  Magnesium     Status: Abnormal    Collection Time: 02/11/20  6:23 AM  Result Value Ref Range    Magnesium 2.5 (H) 1.7 - 2.4 mg/dL  Glucose, capillary     Status:  Abnormal    Collection Time: 02/11/20  7:34 AM  Result Value Ref Range    Glucose-Capillary 194 (H) 70 - 99 mg/dL  Glucose, capillary     Status: Abnormal    Collection Time: 02/11/20 11:47 AM  Result Value Ref Range    Glucose-Capillary 193 (H) 70 - 99 mg/dL       Imaging Results (Last 48 hours)  Pelvis Portable   Result Date: 02/10/2020 CLINICAL DATA:  LEFT hip arthroplasty EXAM: PORTABLE PELVIS 1-2 VIEWS COMPARISON:  February 08, 2020 FINDINGS: Status post LEFT hip arthroplasty. Orthopedic hardware is intact and without periprosthetic fracture or lucency. Osteopenia. No additional acute fracture or dislocation noted on limited views. LEFT-sided subcutaneous air. Foley catheter. Surgical clips project over the LEFT groin. IMPRESSION: Expected postsurgical changes status post LEFT hip arthroplasty. Electronically Signed   By: Valentino Saxon MD   On: 02/10/2020  18:50       Assessment/Plan: Diagnosis: Left Subcapital Femoral neck fracture 1. Does the need for close, 24 hr/day medical supervision in concert with the patient's rehab needs make it unreasonable for this patient to be served in a less intensive setting? Yes 2. Co-Morbidities requiring supervision/potential complications: RIght frontal SDH, hx DM with charcot foot deformites, CKD IV, d CHF, HLD 3. Due to bladder management, bowel management, safety, skin/wound care, disease management, medication administration, pain management and patient education, does the patient require 24 hr/day rehab nursing? Yes 4. Does the patient require coordinated care of a physician, rehab nurse, therapy disciplines of PT, OT, SLP to address physical and functional deficits in the context of the above medical diagnosis(es)? Yes Addressing deficits in the following areas: balance, endurance, locomotion, strength, transferring, bowel/bladder control, bathing, dressing, toileting, cognition, psychosocial support and eval for higher level  cognition 5. Can the patient actively participate in an intensive therapy program of at least 3 hrs of therapy per day at least 5 days per week? Yes 6. The potential for patient to make measurable gains while on inpatient rehab is good 7. Anticipated functional outcomes upon discharge from inpatient rehab are min assist  with PT, min assist with OT, modified independent with SLP. 8. Estimated rehab length of stay to reach the above functional goals is: 21-25d 9. Anticipated discharge destination: Home 10. Overall Rehab/Functional Prognosis: fair   RECOMMENDATIONS: This patient's condition is appropriate for continued rehabilitative care in the following setting: CIR Patient has agreed to participate in recommended program. Yes Note that insurance prior authorization may be required for reimbursement for recommended care.   Comment: Will need to tolerate therapy from a pain perspective     Cathlyn Parsons, PA-C 02/11/2020    "I have personally performed a face to face diagnostic evaluation of this patient.  Additionally, I have reviewed and concur with the physician assistant's documentation above." [name] M.D. [facility] FAAPM&R (Neuromuscular Med) Diplomate Am Board of Electrodiagnostic Med Fellow Am Board of Interventional Pain            Revision History                     Routing History           Note Details  Author Kristen Blake, MD File Time 02/11/2020  3:04 PM  Author Type Physician Status Signed  Last Editor Kristen Blake, MD Service Physical Medicine and [place] # [id number] Admit Date 02/15/2020

## 2020-02-15 NOTE — Progress Notes (Signed)
Physical Therapy Treatment Patient Details Name: [name] MRN: [id number] DOB: 27-Sep-1940 Today's Date: 02/15/2020    History of Present Illness Pt is an 79 yo female admitted after a fall after getting out of shower with resulting L femoral neck fx. Pt underwent L posterior lateral THR with posterior hip precautions.  Pt also found to have possible subacute infarct to R frontal lobe.  Neuorsurgery not sure it is a true hematoma and has no follow up. Pt did hit back of head in fall as seen with abrasion. 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 Pt has also had TKR.     PT Comments    Pt with good progress with mobility and PT goals this afternoon, she was able to progress to tolerance of multiple short bouts of walking within the session while on RA with SpO2 >90%. The pt continues to require cues for hip precautions, sequencing, and technique with mobility, but she remains highly motivated to continue with therapies. The pt will continue to benefit from skilled PT to improve functional strength, stability, and independence to allow for safe d/c home.    Follow Up Recommendations  CIR     Equipment Recommendations   (defer to post acute)    Recommendations for Other Services       Precautions / Restrictions Precautions Precautions: Fall;Posterior Hip Precaution Booklet Issued: No Precaution Comments: Reviewed hip precautions- needs handout. 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 Restrictions Weight Bearing Restrictions: Yes LLE Weight Bearing: Weight bearing as tolerated Other Position/Activity Restrictions: posterior hip precautions    Mobility  Bed Mobility Overal bed mobility: Needs Assistance Bed Mobility: Supine to Sit     Supine to sit: Min assist;HOB elevated     General bed mobility comments: significant increased time, cues for hip precautions  with all movement  Transfers Overall transfer level: Needs assistance Equipment used: Rolling walker (2 wheeled) Transfers: Sit to/from Stand Sit to Stand: Mod assist         General transfer comment: Assist to power to standing with cues for hand placement, slow to rise, cues to stand fully upright, O2 remaining stable with RA  Ambulation/Gait Ambulation/Gait assistance: Min assist Gait Distance (Feet): 3 Feet (+6) Assistive device: Rolling walker (2 wheeled) Gait Pattern/deviations: Step-to pattern;Decreased stride length;Trunk flexed Gait velocity: decreased Gait velocity interpretation: <1.31 ft/sec, indicative of household ambulator General Gait Details: pt with lateral steps with significant cues and assist for all steps, then able to complete second bout of walking after seated rest. HR to 120s, SpO2 in low 90s on RA       Balance Overall balance assessment: Needs assistance Sitting-balance support: Feet supported;No upper extremity supported Sitting balance-Leahy Scale: Fair Sitting balance - Comments: Limited unsupported sitting tolerance.   Standing balance support: During functional activity Standing balance-Leahy Scale: Poor Standing balance comment: Requires UE support in standing with flexed posture.                            Cognition Arousal/Alertness: Awake/alert Behavior During Therapy: Anxious Overall Cognitive Status: Within Functional Limits for tasks assessed                                 General Comments: Remains anxious with dyspnea with  mobility, also unable to state hip precuations, otherwise oriented and following commands      Exercises General Exercises - Lower Extremity Long Arc Quad: AROM;Both;10 reps;Seated Heel Raises: AROM;Both;10 reps;Seated    General Comments General comments (skin integrity, edema, etc.): VSS on RA with gait, left on 0.5 L due to pt in low 90s and RN alerted      Pertinent  Vitals/Pain Pain Assessment: Faces Faces Pain Scale: Hurts little more Pain Location: Lft hip Pain Descriptors / Indicators: Aching;Discomfort;Grimacing;Sore;Guarding Pain Intervention(s): Limited activity within patient's tolerance;Monitored during session;Repositioned           PT Goals (current goals can now be found in the care plan section) Acute Rehab PT Goals Patient Stated Goal: to be able to use the commode and not a bedpan PT Goal Formulation: With patient Time For Goal Achievement: 02/25/20 Potential to Achieve Goals: Fair Progress towards PT goals: Progressing toward goals    Frequency    Min 5X/week      PT Plan Current plan remains appropriate       AM-PAC PT "6 Clicks" Mobility   Outcome Measure  Help needed turning from your back to your side while in a flat bed without using bedrails?: A Little Help needed moving from lying on your back to sitting on the side of a flat bed without using bedrails?: A Little Help needed moving to and from a bed to a chair (including a wheelchair)?: A Lot Help needed standing up from a chair using your arms (e.g., wheelchair or bedside chair)?: A Little Help needed to walk in hospital room?: A Lot Help needed climbing 3-5 steps with a railing? : Total 6 Click Score: 14    End of Session Equipment Utilized During Treatment: Gait belt;Oxygen Activity Tolerance: Patient tolerated treatment well;Patient limited by fatigue Patient left: with call bell/phone within reach;in bed;with bed alarm set Nurse Communication: Mobility status PT Visit Diagnosis: Muscle weakness (generalized) (M62.81);Pain;Other abnormalities of gait and mobility (R26.89) Pain - Right/Left: Left Pain - part of body: Hip     Time: 1219-7588 PT Time Calculation (min) (ACUTE ONLY): 35 min  Charges:  $Gait Training: 23-37 mins                     Karma Ganja, PT, DPT   Acute Rehabilitation Department Pager #: [phone]   [name] 02/15/2020, 4:06 PM

## 2020-02-15 NOTE — H&P (View-Only) (Signed)
Physical Medicine and Rehabilitation Admission H&P    Chief Complaint  Patient presents with  . Left hip fracture with functional deficits.     HPI: [name] is an 79 year old RH-female with history of T2DM with retinopathy and nephropathy- CKD IV ([name]), CAD s/p stent, chronic diastolic CHF- chronic BLE edema, peripheral neuropathy who was admitted on 02/08/20 after sustaining a fall while getting out of the shower. She had onset of left hip pain as well as scalp contusion but No LOC or CP. She was found to have acute left subcapital femoral neck fracture with displacement and angulation. CT head with subacute/remote cortical infarct right frontal lobe with advanced atlantodental and cervical DDD.  MRI brain done and revealed thin acute right frontal lobe SDH with moderate stable small vessel disease and suspected 5 mm incidental meningioma. MRA brain ordered due to concerns of stroke and showed moderate to severe stenosis bilateral cavernous ICA, moderate focal stenosis proximal M2 L-MCA and mild/moderate stenosis L-PCA at P3/P4 junction.   [name] consulted for input and questioned if SDH present as patient without neurological symptoms and no follow up recommended. She underwent left hip bipolar arthroplasty by [name] on 11/14. Post op to be WBAT and on ASA/Plavix for stroke and DVT prophylaxis.  Post op with ABLA treated with one unit PRBC as well as acute hypoxic respiratory failure requiring venturi mask briefly, DOE, anxiety issues--on valium prn as well as urinary retention with I/O caths at [phone] cc. She was started on urecholine and foley placed 11/18.  Hypoxia felt to be due to fluid overload and treated with IV lasix urine. Respiratory status improved being weaned off oxygen. Therapy ongoing and patient limited by dizziness, fatigue as well as inability to recall hip precautions. Rehab CM also reporting that patient had transient episodes audible hallucinations on  her past two visits. CIR recommended due to functional decline.    Review of Systems  Constitutional: Negative for chills and fever.  HENT: Negative for hearing loss and tinnitus.   Eyes: Negative for blurred vision and double vision.  Respiratory: Positive for shortness of breath. Negative for cough and stridor.   Cardiovascular: Positive for palpitations and leg swelling. Negative for chest pain.  Gastrointestinal: Positive for abdominal pain, heartburn and nausea.  Genitourinary:       Gets up 1-2 times at nights to urinate.   Musculoskeletal: Positive for joint pain and myalgias.  Neurological: Negative for dizziness and headaches.  Psychiatric/Behavioral: Positive for memory loss (mild cognitive deficits at baseline). The patient has insomnia (Got up on and off at nights PTA. ).      Past Medical History:  Diagnosis Date  . Anemia    have received iron infusions  . Anxiety   . Arthritis     osteoarthritis  . Breast cancer ([place])   . Breast cancer in female [facility])    Bilateral  . CAD S/P percutaneous coronary angioplasty 2007; March 2011   [facility] 3.0 mm 20 mm postdilated 3.6 mm in early mid LAD; status post ISR Cutting Balloon PTCA and March 11 along with PCI of distal mid lesion with a 3.0 mm 12 mm MultiLink vision BMS; the proximal stent causes jailing of SP1 and SP2 with ostial 70-80% lesions  . Cancer (HCC)    Melanoma, Squamous cell Carcinoma  . Charcot's joint of foot, right   . Charcot's joint of left foot   . Chronic diastolic CHF (congestive heart failure),  NYHA class 2 ([place]) 02/28/2018  . Chronic kidney disease    stage 2   . Coronary artery disease   . Dementia ([place])    mild  . Diabetes mellitus type 2 with neurological manifestations ([place])   . Diabetes mellitus without complication ([place])   . Dyslipidemia, goal LDL below 70   . Foot pain, bilateral   . Full dentures   . Genetic testing 12/03/2016   Germline genetic testing was performed through [facility]  Common Hereditary Cancers Panel + [facility] Melanoma Panel. This custom panel includes analysis of the following 51 genes: APC, ATM, AXIN2, BAP1, BARD1, BMPR1A, BRCA1, BRCA2, BRIP1, CDH1, CDK4, CDKN2A, CHEK2, CTNNA1, DICER1, EPCAM, GREM1, HOXB13, KIT, MEN1, MITF, MLH1, MSH2, MSH3, MSH6, MUTYH, NBN, NF1, NTHL1, PALB2, PDGFRA, PMS2, POLD1, POL  . GERD (gastroesophageal reflux disease)   . Gout   . Headache(784.0)   . History of hematuria    Followed by Dr. Gaynelle Arabian  . Hypertension, essential, benign   . Migraine   . Mild cognitive impairment   . Peripheral neuropathy   . Personal history of radiation therapy   . Post herpetic neuralgia   . Restless leg syndrome   . Stroke ([place])   . TIA (transient ischemic attack)    multiple in the past  . TIA (transient ischemic attack)   . Wears glasses     Past Surgical History:  Procedure Laterality Date  . ABDOMINAL HYSTERECTOMY    . ANKLE FUSION Right 02/22/2018   Procedure: RIGHT SUBTALAR AND TALONAVICULAR FUSION;  Surgeon: Newt Minion, MD;  Location: [place];  Service: Orthopedics;  Laterality: Right;  . APPENDECTOMY    . BLADDER SUSPENSION    . BREAST LUMPECTOMY Bilateral 2018  . BREAST LUMPECTOMY WITH RADIOACTIVE SEED AND SENTINEL LYMPH NODE BIOPSY Bilateral 12/14/2016   Procedure: BILATERAL BREAST LUMPECTOMY WITH BILATERAL  RADIOACTIVE SEED AND BILATERAL AXILLARY  SENTINEL LYMPH NODE BIOPSY ERAS PATHWAY;  Surgeon: Alphonsa Overall, MD;  Location: [place];  Service: General;  Laterality: Bilateral;  PECTORAL BLOCK  . CARDIAC CATHETERIZATION  02/24/2006   85% stenosis in the proximal portion of LAD-arrangements made for PCI on 02/25/2006  . CARDIAC CATHETERIZATION  02/25/2006   80% LAD lesion stented with a 3x51m Liberte stent resulting in reduction of 80% lesion to 0% residual  . CARDIAC CATHETERIZATION  04/26/2006   Medical management  . CARDIAC CATHETERIZATION  06/24/2009   60-70% re-stenosis in the proximal LAD. A 3.25x15 cutting balloon,  3 inflations - 14atm-38sec, 13atm-39sec, and 12atm-40sec reduced to less than 10%. 60% stenosis of the mid/distal LAD stented with a 3x152mMultilink stent.  . Marland KitchenARDIAC CATHETERIZATION  07/09/2009   Medical management  . CARDIAC CATHETERIZATION    . CAROTID DOPPLER  06/24/2009   40-59% R ICA stenosis. No significant ICA stenosis noted  . CHOLECYSTECTOMY    . CORONARY ANGIOPLASTY    . DILATION AND CURETTAGE OF UTERUS    . HIP ARTHROPLASTY Left 02/10/2020   Procedure: ARTHROPLASTY BIPOLAR HIP (HEMIARTHROPLASTY) POSTERIOR LATERAL;  Surgeon: MuRenette ButtersMD;  [place] Service: Orthopedics;  Laterality: Left;  . JOINT REPLACEMENT Left   . LESION REMOVAL Right 03/11/2015   Procedure:  EXCISION OF RIGHT PRE TIBIAL LESION;  Surgeon: JaJudeth HornMD;  [place] [facility] Service: General;  Laterality: Right;  . MASS EXCISION Left 06/10/2014   Procedure: EXCISION LEFT LOWER LEG LESION;  Surgeon: JaDoreen SalvageMD;  [place] Service: General;  Laterality: Left;  Marland Kitchen MASS EXCISION Left 09/05/2015   Procedure: EXCISION OF LEFT FOREARM  MASS;  Surgeon: Judeth Horn, MD;  Location: [place];  Service: General;  Laterality: Left;  . NM MYOVIEW LTD  10/2011   No ischemia or infarction.  Normal EF.  LOW RISK. (Short bursts of 5 beats NSVT during recovery otherwise normal)  . SHOULDER ARTHROSCOPY  10/15  . SHOULDER ARTHROSCOPY  1995   left  . TEE WITHOUT CARDIOVERSION N/A 08/03/2017   Procedure: TRANSESOPHAGEAL ECHOCARDIOGRAM (TEE);  Surgeon: Skeet Latch, MD;  Location: [place];  Service: Cardiovascular;; EF 60-65% with no wall motion normalities.  No atrial thrombus noted.  Lightly findings consistent with a lipomatous hypertrophy of the atrial septum.   . TENDON REPAIR Left 05/12/2016   Procedure: Left Anterior Tibial Tendon Reconstruction;  Surgeon: Newt Minion, MD;  Location: [place];  Service: Orthopedics;  Laterality: Left;  .  TOTAL KNEE ARTHROPLASTY  2005   left  . TRANSESOPHAGEAL ECHOCARDIOGRAM  08/03/2016   : EF 60-65% no wall motion normalities.  No atrial thrombus.  Lipomatous hypertrophy of the atrial septum.  No mass noted.  . TRANSESOPHAGEAL ECHOCARDIOGRAM  07/2017   EF 60 to 65%.  No R WMA.  No atrial thrombus noted.  Lipomatous IAS.  Marland Kitchen TRANSTHORACIC ECHOCARDIOGRAM  02/25/2017   :EF 65-70%.  GR 1 DD.  No or WMA.  Moderate aortic calcification/sclerosis but no stenosis.  . TRANSTHORACIC ECHOCARDIOGRAM  07/2017   Vigorous LV function.  EF 65-70%.  Mild diastolic dysfunction with no RWMA. GR 1 DD.  Mild aortic sclerosis/no stenosis.  Questionable right atrial mass discounted with TEE)   . WRIST ARTHROPLASTY  2010   cancer lt wrist    Family History  Problem Relation Age of Onset  . Diabetes Mother   . Kidney disease Mother   . Diabetes Sister   . Diabetes Brother   . Hypertension Mother   . Hypertension Father   . Emphysema Father   . Heart attack Father   . Heart disease Father   . Arthritis Sister   . Diabetes Sister   . Hypertension Sister   . Breast cancer Sister 42       treated with mastectomy  . Cervical cancer Sister 72  . Hypertension Brother   . Hyperlipidemia Brother   . Diabetes Brother   . Stroke Brother   . Diabetes Sister   . Hypertension Sister   . Stomach cancer Sister 48  . Hypertension Brother   . ALS Brother        d.62s  . Breast cancer Daughter 3       triple negative treated with lumpectomy, chemo, radiation  . Heart attack Daughter 26       d.45  . COPD Daughter   . Cancer Paternal Aunt        unspecified type  . Cancer Paternal Uncle        unspecified type  . Cancer Paternal Uncle        unspecified type    Social History: Is married--husband manages medications. She was independent with cane or walker/wheelchair on bad days to prevent falls. She reports that she has never smoked. She has never used smokeless tobacco. She reports that she does not drink  alcohol and does not use drugs.    Allergies:  Allergies  Allergen Reactions  . Nsaids Nausea And Vomiting and Palpitations  . Reglan [Metoclopramide] Other (See Comments)  Seizures   . Tramadol Other (See Comments)    Seizures   . Ultram [Tramadol] Other (See Comments)    Pt has seizure activity with this medication  . Lipitor [Atorvastatin] Other (See Comments)    Bone and muscle pain  . Lipitor [Atorvastatin] Swelling and Other (See Comments)    Pain   . Lyrica [Pregabalin] Other (See Comments)    Weight gain, extremity swelling  . Lyrica [Pregabalin] Swelling  . Reglan [Metoclopramide] Other (See Comments)    Tardive dyskinesia; paradoxical reaction not relieved by benadryl  . Nsaids Swelling    SWELLING REACTION UNSPECIFIED   . Urecholine [Bethanechol]     Palpitations and nausea      Medications Prior to Admission  Medication Sig Dispense Refill  . BIOTIN PO Take 1 tablet by mouth daily.    . Cholecalciferol (VITAMIN D) 50 MCG (2000 UT) tablet Take 2,000 Units by mouth daily.    . clopidogrel (PLAVIX) 75 MG tablet Take 75 mg by mouth daily.    . Coenzyme Q10 (COQ-10 PO) Take 1 tablet by mouth daily.    . colchicine 0.6 MG tablet Take 0.6 mg by mouth daily as needed (for up to 3 days for gout flare).    . CRANBERRY-CALCIUM PO Take 1 tablet by mouth daily.    . diazepam (VALIUM) 5 MG tablet Take 5 mg by mouth 3 (three) times daily as needed.    . febuxostat (ULORIC) 40 MG tablet Take 40 mg by mouth daily.    Marland Kitchen gabapentin (NEURONTIN) 300 MG capsule Take 300 mg by mouth 2 (two) times daily.     Marland Kitchen JANUVIA 25 MG tablet Take 25 mg by mouth daily.    Marland Kitchen KRILL OIL PO Take 1 capsule by mouth daily.    . Methylfol-Methylcob-Acetylcyst (CEREFOLIN NAC) 6-2-600 MG TABS Take 1 tablet by mouth in the morning.    . metolazone (ZAROXOLYN) 2.5 MG tablet Take 2.5 mg by mouth daily. 30 minutes before Bumetanide Mon. & Thurs.    . metoprolol succinate (TOPROL-XL) 25 MG 24 hr tablet Take  25 mg by mouth at bedtime.     . mirtazapine (REMERON) 15 MG tablet Take 15 mg by mouth at bedtime.     . nitroGLYCERIN (NITROLINGUAL) 0.4 MG/SPRAY spray Place 1 spray under the tongue every 5 (five) minutes x 3 doses as needed for chest pain.    . pramipexole (MIRAPEX) 0.25 MG tablet Take 0.75 mg by mouth at bedtime.     Marland Kitchen Resveratrol 250 MG CAPS Take 500 mg by mouth daily.    . rosuvastatin (CRESTOR) 20 MG tablet Take 20 mg by mouth daily.     Drug Regimen Review  Drug regimen was reviewed and remains appropriate with no significant issues identified  Home: Home Living Family/patient expects to be discharged to:: Inpatient rehab Living Arrangements: Spouse/significant other Additional Comments: Pt lives in ones story home with 3 steps to enter, has both tub-shower and walk in shower with built-in seats and grab bars. Has a RW and Cane   Functional History: Prior Function Level of Independence: Independent with assistive device(s) (pt reports furniture walking or intermittent use of cane) Comments: Pt could not drive but was able to do most other adls other than cooking bc her husband does this. Pt reports use of cane or furniture as RW does not fit inher house   Functional Status:  Mobility: Bed Mobility Overal bed mobility: Needs Assistance Bed Mobility: Supine to Sit, Sit to  Supine Supine to sit: Max assist, +2 for physical assistance Sit to supine: Total assist, +2 for physical assistance General bed mobility comments: Up in chair upon arrival. Transfers Overall transfer level: Needs assistance Equipment used: Rolling walker (2 wheeled) Transfers: Sit to/from Stand Sit to Stand: Mod assist General transfer comment: Assist to power to standing with cues for hand placement, slow to rise, cues to stand fully upright, stated dizziness but BP improved with O2 remaining stable at 1L Ambulation/Gait Ambulation/Gait assistance: Min assist Gait Distance (Feet): 3 Feet Assistive  device: Rolling walker (2 wheeled) Gait Pattern/deviations: Step-through pattern, Decreased stride length General Gait Details: pt with limited lateral stepping to bed from recliner. Reports 7/10 fatigue but also stating she would like to walk to bathroom.  Gait velocity interpretation: <1.31 ft/sec, indicative of household ambulator   ADL: ADL Overall ADL's : Needs assistance/impaired Eating/Feeding: Set up, Sitting, Cueing for safety Grooming: Wash/dry hands, Wash/dry face, Oral care, Set up, Bed level Grooming Details (indicate cue type and reason): Pt unable to stand at sink at this time. EOB only due to pain. Upper Body Bathing: Set up, Bed level, Cueing for safety Lower Body Bathing: Total assistance, +2 for physical assistance, Sitting/lateral leans, Adhering to hip precautions Lower Body Bathing Details (indicate cue type and reason): Pt with posterior hip precuations that she does not yet understand.  Unable to get into complete standing due to pain and increased heart rate.  Upper Body Dressing : Minimal assistance, Sitting, Cueing for compensatory techniques Lower Body Dressing: Total assistance, +2 for physical assistance, Adhering to hip precautions, Sit to/from stand, Cueing for compensatory techniques Lower Body Dressing Details (indicate cue type and reason): Pt requires further education with hip precuations and introduction to adaptive equipment. Toilet Transfer Details (indicate cue type and reason): attempted to stand to get to [facility]. Pt with increased HR to 126 and decreased O2 sats to 85% despite increasing O2 earlier in session to 3L. Toileting- Clothing Manipulation and Hygiene: Total assistance, +2 for physical assistance, Adhering to hip precautions, Sitting/lateral lean Functional mobility during ADLs: Moderate assistance, +2 for physical assistance, +2 for safety/equipment General ADL Comments: Pt unable to stand or complete any LE adls or toileting adls due to pain, and  dizziness, stating she just doesnt feel like herself, BP more stable in session to date   Cognition: Cognition Overall Cognitive Status: No family/caregiver present to determine baseline cognitive functioning Orientation Level: Oriented X4 Cognition Arousal/Alertness: Awake/alert Behavior During Therapy: Anxious Overall Cognitive Status: No family/caregiver present to determine baseline cognitive functioning General Comments: Remains anxious with dyspnea with mobility, also unable to state hip precuations, otherwise oriented and following commands  Blood pressure 101/60, pulse 98, temperature 98.4 F (36.9 C), resp. rate 15, height _0  (1.676 m), weight 89.4 kg, SpO2 94 %. Physical Exam  General: Anxious female. Increased WOB with conversation and bouts of anxiety.  On 2L oxygen per [place].   HEENT: Head is normocephalic, atraumatic, PERRLA, EOMI, sclera anicteric, oral mucosa pink and moist, dentition intact, ext ear canals clear,  Neck: Supple without JVD or lymphadenopathy Heart: Reg rate and rhythm. No murmurs rubs or gallops Chest: Tachycardia present.  Abdomen: Soft, non-tender, non-distended, bowel sounds positive. Extremities: 2+ lymphedema BLE.  Skin: Clean and intact without signs of breakdown Neuro: Anxious , distracted and mildly disoriented ---occasional inappropriate comments and daughter needed to cue patient to answer medical hx and to help calm the patient. No focal deficits. 4/5 strength in upper extremities and 3/5  lower extremities (limited by lymphedema). Psych: Pt's affect is appropriate. Pt is cooperative   Results for orders placed or performed during the hospital encounter of 02/15/20 (from the past 48 hour(s))  Glucose, capillary     Status: Abnormal   Collection Time: 02/15/20  4:47 PM  Result Value Ref Range   Glucose-Capillary 153 (H) 70 - 99 mg/dL    Comment: Glucose reference range applies only to samples taken after fasting for at least 8 hours.   No  results found.     Medical Problem List and Plan: 1.  Impaired mobility and ALDs secondary to subdural hemorrhage  -patient may shower  -ELOS/Goals:  2.  Antithrombotics: -DVT/anticoagulation:  Mechanical: Sequential compression devices, below knee Bilateral lower extremities  -antiplatelet therapy: Plavix 3. Neuropathy/charcot feet/Pain Management: Uses braces and gabapentin for pain.  4. Mood: LCSW to follow for evaluation and support.   -antipsychotic agents: N/A 5. Neuropsych: This patient is not fully capable of making decisions on her own behalf. 6. Skin/Wound Care: Monitor incision for healing. Continue supplements to promote wound healing.  7. Fluids/Electrolytes/Nutrition: Monitor I/O. Change glucerna to with meals.  8. Left femur fracture: WBAT with posterior hip precautions.  9. 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 SCr better but BUN with rise--> d/c Naprosyn.  Will order orthostatic vitals and monitor Lytes with serial checks. . 10. CAD s/p PCI/stent: Monitor for symptoms with activity.  11. T2DM:  Hgb A1c-8.8.  Monitor BS ac/hs --resume Januvia and use SSI for elevated  BS. CBGs have been moderately elevated.  12. Chronic diastolic CHF: Monitor for signs of overload--order flutter valve for pulmonary hygiene. Her oxygen is being weaned off today--order prn use with activity. Will check weights daily. On metoprolol, Plavix, Crestor and Bumex resumed 11/17.  13. Urinary retention/Leucocytosis: Check UA/UCS--d/c foley in am and start bladder training. Discontinue urecholine-->on Valium (to relax bladder). UA was positive on 11/17 but culture showed no growth. 14. Acute blood loss anemia: Monitor H/H for trends--13.3-->8.8--> 8.9 15. Peripheral edema: 2+ edema-> good day for legs 11/19 per family.  16. Insomnia: Trazodone 25--61m HS PRN  PReesa Chew PA-C  I have personally performed a face to face diagnostic evaluation, including, but not limited to  relevant history and physical exam findings, of this patient and developed relevant assessment and plan.  Additionally, I have reviewed and concur with the physician assistant's documentation above.  KIzora Ribas MD 02/15/2020

## 2020-02-15 NOTE — Discharge Summary (Addendum)
Physician Discharge Summary  [name] VOZ:[id number] DOB: 04-08-1940 DOA: 02/08/2020  PCP: Kristen Moro, DO  Admit date: 02/08/2020 Discharge date: 02/15/2020   Discharge Diagnoses/Plan:   [name] Weights   02/11/20 0342 02/12/20 0500 02/13/20 0453  Weight: 89.3 kg 89.3 kg 89.4 kg    Discharge activity: Per PT/OT  History of present illness:  HPI: [name] is a 79 y.o. female with history of CAD status post stenting, chronic kidney disease stage IV follows with Dr. Posey [name], diabetes mellitus type 2, chronic diastolic CHF and chronic bilateral extremity edema had a fall at home when patient was trying to get out of the shower.  Did not hit her head but did not lose consciousness did not have any chest pain or shortness of breath.  Had pain around the left hip and was brought to the ER.  ED Course: X-rays revealed left hip fracture and on-call orthopedic surgeon Dr. Gilford [name] was consulted.  Since patient had a hit her head CT head and C-spine was done which shows possibility of a subacute infarct in the right frontal area.  Neurologist on-call was consulted requested MRI brain to make sure there was no acute stroke.  EKG shows normal sinus rhythm.  Labs are largely at baseline with creatinine 1.7 blood glucose 179 CBC unremarkable.   Hospital Course:  Pt admitted on the 12 for fall and found to have left hip # S/P left hip hemiarthroplasty. She was per chart review fell in bathroom and hit her head, and also has subacute /acute remote cortical infarct. Neurology was consulted along with neurosurgery who recommended supportive care and ? For hematoma presnece or note. Per neurology pt had traumatic hematoma. Daughter was told that mom will be d/c to [facility].   Procedures: Let hip hemiarthroplasty.    Consultations:  Orthopedic.    Discharge Exam: Vitals:   02/15/20 0941 02/15/20 1200  BP: (!) 103/93 137/88  Pulse:  97  Resp:  18  Temp:  98.2 F (36.8 C)  SpO2:  98%     Physical Exam Vitals and nursing note reviewed.  Constitutional:      Appearance: Normal appearance.  HENT:     Head: Normocephalic and atraumatic.     Right Ear: External ear normal.     Left Ear: External ear normal.     Nose: Nose normal.  Eyes:     Extraocular Movements: Extraocular movements intact.     Pupils: Pupils are equal, round, and reactive to light.  Cardiovascular:     Rate and Rhythm: Regular rhythm.     Pulses: Normal pulses.  Pulmonary:     Effort: Pulmonary effort is normal.     Breath sounds: Normal breath sounds.  Abdominal:     Palpations: Abdomen is soft.  Musculoskeletal:        General: Normal range of motion.     Right lower leg: No edema.     Left lower leg: No edema.  Skin:    General: Skin is warm.  Neurological:     Mental Status: She is alert.  Psychiatric:        Mood and Affect: Mood normal.        Behavior: Behavior normal.     Discharge Instructions Discharge Instructions    Call MD for:  difficulty breathing, headache or visual disturbances   Complete by: As directed    Call MD for:  hives   Complete by: As directed    Call MD for:  persistant dizziness or light-headedness   Complete by: As directed    Call MD for:  persistant nausea and vomiting   Complete by: As directed    Call MD for:  redness, tenderness, or signs of infection (pain, swelling, redness, odor or green/yellow discharge around incision site)   Complete by: As directed    Call MD for:  severe uncontrolled pain   Complete by: As directed    Call MD for:  temperature >100.4   Complete by: As directed    Diet - low sodium heart healthy   Complete by: As directed    Discharge instructions   Complete by: As directed    Plz follow up with all sch appt and make a f/U appt with pcp.   No wound care   Complete by: As directed    Walk with assistance   Complete by: As directed      Allergies  Allergen Reactions  . Nsaids Nausea And Vomiting and  Palpitations  . Reglan [Metoclopramide] Other (See Comments)    Seizures   . Tramadol Other (See Comments)    Seizures   . Lipitor [Atorvastatin] Swelling and Other (See Comments)    Pain   . Lyrica [Pregabalin] Swelling  . Urecholine [Bethanechol]     Palpitations and nausea    Follow-up Information    Kristen Butters, MD In 2 weeks.   Specialty: Orthopedic Surgery Contact information: [address] [place] [address] [phone]        Kristen Moro, DO Follow up in 1 week(s).   Specialty: General Practice Contact information: [place] [address] [phone]              Left femoral neck fracture, closed Patient presenting from home following a fall in shower with associated left hip pain.  X-Kegg pelvis notable for acute displaced, angulated subcapital left femoral neck fracture.  Patient underwent posterior lateral approach left hip hemiarthroplasty by Dr. Percell [name] on 02/10/2020. --[name] 5-325mg  1-2 tabs q6hr prn moderate pain --[name] as needed for muscle spasms --morphine 1 mg every 2 hours prn severe pain --DVT prophylaxis with [name]/[name]/[name] per Ortho --PT/OT following, recommend CIR; pending bed --Rehab and DVT prophylaxis with asa and [name].   Acute postoperative urinary retention Etiology likely from decreased mobility.  In and out cath 3 times past 24 hours.  Foley catheter placed on 02/14/2020. --Start bethanechol 25 mg 3 times daily --Consider voiding trial 02/15/2020. --f/u and wean trial at [facility].  Acute blood loss anemia Patient received 1 unit PRBC intraoperatively for blood loss on 11/14.  --Hgb 13.3>14.6>11.4>11.0>11.8>11.5>11.1>10.1 --Continue to monitor hemoglobin daily. --hb today is 8.9.  Acute hypoxic respiratory failure Postoperatively, patient requiring supplemental oxygen via facemask at 4 L.  Suspect anesthesia effect.  Currently on 2 L nasal cannula this morning with SPO2 96%,  titrated down to 2 L nasal cannula.  Chest x-Leccese 02/12/2020 with mild pulmonary vascular congestion --Continue mental oxygen, maintain SPO2 greater than 92% --Incentive spirometry --Restarted home Bumex --wean O2 as able --Pt is on RA and stable for CIR.   Right frontal subdural hematoma Presenting following a fall in the bathtub in which she struck the back of her head.  Initial CT head/C-spine shows no acute intracranial injury, no acute fracture or listhesis but does note possible subacute versus remote cortical infarct precentral gyrus of right frontal lobe.  MR brain notable for thin acute 4 mm subdural hematoma right frontal lobe, no reports of  ischemic CVA.  Neurosurgery was consulted, no surgical issue and no repeat scan should be obtained per [name]; and even questions if hematoma exists.  --Supportive care, continue neurochecks --Neurosurgery signed off 02/08/2020. --Outpatient neurology followup.   CAD s/p PCI/stent On [name] 75 mg p.o. daily, Crestor 20 mg p.o. daily, metoprolol succinate 12.5 mg p.o. nightly.  Follows with cardiology outpatient, [name].  Previous bare-metal stent/PCI LAD 2006. --Continue metoprolol succinate 12.5mg  daily --Resumed home [name] and Crestor -- cont asa/[name] and Crestor.   Chronic diastolic congestive heart failure --Continue metoprolol succinate 12.5 mg p.o. daily --Also on Bumex and metolazone (Mon/Thur), restarted Bumex 11/17 --Strict I's and O's and daily weights at [facility].   CKD stage IV Follows with nephrology outpatient, Dr. Posey [name].  Creatinine 1.70 on admission, baseline 1.6-1.9. On Bumex and metolazone outpatient. --Cr 1.70>1.69>1.46>1.56>1.59>1.52>1.45 --Continue monitor strict I's and O's and daily weights --Continue to hold diuretics for now, until oral intake improves -- Lab Results  Component Value Date   CREATININE 1.35 (H) 02/15/2020   CREATININE 1.45 (H) 02/14/2020   CREATININE 1.52 (H) 02/13/2020     Type  2 diabetes mellitus Home regimen includes Januvia 25 mg p.o. daily.  Hemoglobin A1c 8.8, poorly controlled --Hold oral hypoglycemics while inpatient --Insulin sliding scale for coverage --CBGs qAC/HS --no change cont current regimen .  HLD --continue home Crestor  Hx Breast Ca Diagnosed in 2018, follows with [name] outpatient; last visit 12/05/2019   DVT prophylaxis: [name], [name]/[name] Code Status: Full Code Family Communication: No family present at bedside this morning  Disposition Plan:  Status is: Inpatient  Remains inpatient appropriate because:Ongoing active pain requiring inpatient pain management, Ongoing diagnostic testing needed not appropriate for outpatient work up, Unsafe d/c plan, IV treatments appropriate due to intensity of illness or inability to take PO and Inpatient level of care appropriate due to severity of illness   Dispo: The patient is from: Home  Anticipated d/c is to: CIR  Anticipated d/c date is: 1 day  Patient currently is medically stable to d/c.     Consultants:   Orthopedics, Dr. Percell [name]  Neurosurgery, [name] - signed off 02/08/2020  Neurology - signed off 02/08/2020  Procedures:   posterior lateral approach left hip hemiarthroplasty by Dr. Percell [name] on 02/10/2020  Significant Diagnostic Studies: DG Chest 1 View  Result Date: 02/12/2020 CLINICAL DATA:  Dyspnea EXAM: CHEST  1 VIEW COMPARISON:  Radiograph 02/08/2020 FINDINGS: Chronic elevation of the right hemidiaphragm with some adjacent atelectatic changes. There is some increasing vascular congestion with hazy interstitial opacity and fissural thickening. No focal consolidative opacity is seen. Stable cardiomediastinal contours with a calcified, tortuous aorta. Telemetry leads and support devices overlie the chest. Degenerative changes are present in the imaged spine and shoulders. IMPRESSION: 1. Increasing vascular congestion and  features which may suggest early interstitial edema. 2. Chronic elevation of the right hemidiaphragm with adjacent atelectatic changes. 3.  Aortic Atherosclerosis (ICD10-I70.0). Electronically Signed   By: Lovena Le M.D.   On: 02/12/2020 19:30   CT Head Wo Contrast  Result Date: 02/08/2020 CLINICAL DATA:  Head trauma EXAM: CT HEAD WITHOUT CONTRAST CT CERVICAL SPINE WITHOUT CONTRAST TECHNIQUE: Multidetector CT imaging of the head and cervical spine was performed following the standard protocol without intravenous contrast. Multiplanar CT image reconstructions of the cervical spine were also generated. COMPARISON:  CT head 11/26/2019 FINDINGS: CT HEAD FINDINGS Brain: Normal anatomic configuration of the brain. There is interval development of a small cortical infarct involving the right  frontal lobe involving the precentral gyrus, best seen on axial image # 22/3. No significant associated mass effect. No midline shift. Mild periventricular white matter changes are again identified in keeping with probable small vessel ischemia. No abnormal intra or extra-axial mass lesion. No evidence of acute intracranial hemorrhage. Ventricular size is normal. Cerebellum is unremarkable. Vascular: No asymmetric hyperdense vasculature at the skull base. Skull: Intact Sinuses/Orbits: Visualized paranasal sinuses are clear. Orbits are unremarkable. Other: Mastoid air cells and middle ear cavities are clear. CT CERVICAL SPINE FINDINGS Alignment: 3 mm anterolisthesis C4 upon C5 and C7 upon T1 is likely degenerative in nature. Otherwise normal cervical lordosis. Skull base and vertebrae: The craniocervical junction is unremarkable. The atlantodental interval is normal. No acute fracture of the cervical spine. No lytic or blastic bone lesion Soft tissues and spinal canal: No prevertebral fluid or swelling. No visible canal hematoma. Disc levels: Review of the sagittal images demonstrates preservation of vertebral body height. There  is advanced atlantodental degenerative arthritis. Multiple erosions are seen at the base of the odontoid process likely related to synovial overgrowth. 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 The spinal canal appears widely patent. The prevertebral soft tissues are not thickened. Review of the axial images demonstrates multilevel uncovertebral and facet arthrosis resulting in multilevel neural foraminal narrowing, most severe on the left at C3-4 and on the right at C4-5 as well as mild central canal stenosis at C6-7. Upper chest: Unremarkable Other: Advanced atherosclerotic calcification is seen within the left carotid bifurcation. The degree of stenosis is not well assessed on this noncontrast examination. IMPRESSION: No acute intracranial injury.  No calvarial fracture. 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 No acute fracture or listhesis of the cervical spine. Electronically Signed   By: Fidela Salisbury MD   On: 02/08/2020 03:25   CT Cervical Spine Wo Contrast  Result Date: 02/08/2020 CLINICAL DATA:  Head trauma EXAM: CT HEAD WITHOUT CONTRAST CT CERVICAL SPINE WITHOUT CONTRAST TECHNIQUE: Multidetector CT imaging of the head and cervical spine was performed following the standard protocol without intravenous contrast. Multiplanar CT image reconstructions of the cervical spine were also generated. COMPARISON:  CT head 11/26/2019 FINDINGS: CT HEAD FINDINGS Brain: Normal anatomic configuration of the brain. There is interval development of a small cortical infarct involving the right frontal lobe involving the precentral gyrus, best seen on axial image # 22/3. No significant associated mass effect. No midline shift. Mild periventricular white matter changes are again  identified in keeping with probable small vessel ischemia. No abnormal intra or extra-axial mass lesion. No evidence of acute intracranial hemorrhage. Ventricular size is normal. Cerebellum is unremarkable. Vascular: No asymmetric hyperdense vasculature at the skull base. Skull: Intact Sinuses/Orbits: Visualized paranasal sinuses are clear. Orbits are unremarkable. Other: Mastoid air cells and middle ear cavities are clear. CT CERVICAL SPINE FINDINGS Alignment: 3 mm anterolisthesis C4 upon C5 and C7 upon T1 is likely degenerative in nature. Otherwise normal cervical lordosis. Skull base and vertebrae: The craniocervical junction is unremarkable. The atlantodental interval is normal. No acute fracture of the cervical spine. No lytic or blastic bone lesion Soft tissues and spinal canal: No prevertebral fluid or swelling. No visible canal hematoma. Disc levels: Review of the sagittal images demonstrates preservation of vertebral body height. 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 Multiple  erosions are seen at the base of the odontoid process likely related to synovial overgrowth. 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 The spinal canal appears widely patent. The prevertebral soft tissues are not thickened. Review of the axial images demonstrates multilevel uncovertebral and facet arthrosis resulting in multilevel neural foraminal narrowing, most severe on the left at C3-4 and on the right at C4-5 as well as mild central canal stenosis at C6-7. Upper chest: Unremarkable Other: Advanced atherosclerotic calcification is seen within the left carotid bifurcation. The degree of stenosis is not well assessed on this noncontrast examination. IMPRESSION: No acute intracranial injury.  No calvarial fracture. Interval development of a subacute to remote  cortical infarct involving the precentral gyrus of the right frontal lobe. No acute fracture or listhesis of the cervical spine. Electronically Signed   By: Fidela Salisbury MD   On: 02/08/2020 03:25   MR ANGIO HEAD WO CONTRAST  Result Date: 02/08/2020 CLINICAL DATA:  Neuro deficit, acute, stroke suspected. Additional history provided: Fall at home. EXAM: MRI HEAD WITHOUT CONTRAST MRA HEAD WITHOUT CONTRAST TECHNIQUE: Multiplanar, multiecho pulse sequences of the brain and surrounding structures were obtained without intravenous contrast. Angiographic images of the head were obtained using MRA technique without contrast. COMPARISON:  Noncontrast head CT 02/08/2020. Brain MRI 11/06/2019. MRA head 09/26/2009. FINDINGS: MRI HEAD FINDINGS Brain: Cerebral volume is normal for age. There is a thin acute subdural hematoma overlying the anterolateral right frontal lobe measuring up to 4 mm in greatest thickness (for instance as seen on series 11, image 13). In retrospect, this is appreciable on the head CT performed earlier the same day. No significant mass effect upon the underlying right cerebral hemisphere. No midline shift. Moderate multifocal T2/FLAIR hyperintensity within the cerebral white matter and pons is nonspecific, but compatible with chronic small vessel ischemic disease. Unchanged 5 mm rounded extra-axial calcified focus overlying the anterior left frontal lobe, suspected small incidental meningioma. There is no acute infarct. No extra-axial fluid collection. Vascular: Reported below. Skull and upper cervical spine: No focal marrow lesion. Cervical spondylosis. Ligamentous hypertrophy/pannus formation posterior to the dens. Sinuses/Orbits: Prior lens replacements. Trace ethmoid sinus mucosal thickening. Other: Left parietooccipital scalp soft tissue swelling. MRA HEAD FINDINGS The intracranial internal carotid arteries are patent. Moderate/severe atherosclerotic narrowing of the cavernous segments  bilaterally, progressed as compared to the MRA of 09/26/2009. No M2 proximal branch occlusion. Atherosclerotic irregularity of the M2 and more distal MCA branch vessels bilaterally. Most notably, there is a moderate stenosis within a proximal M2 left MCA branch (series 1051, image 4). The anterior cerebral arteries are patent. The intracranial vertebral arteries are patent. The basilar artery is patent. The posterior cerebral arteries are patent. Mild atherosclerotic narrowing of the proximal left PCA (series 1039, image 15). Mild/moderate narrowing of the left PCA at the P3/P4 junction. Posterior communicating arteries are present bilaterally. These results were called by telephone at the time of interpretation on 02/08/2020 at 7:48 am to provider Dr. Roslynn Amble, who verbally acknowledged these results. IMPRESSION: MRI brain: 1. Thin acute subdural hematoma overlying the anterolateral right frontal lobe, measuring up to 4 mm in thickness. 2. Moderate chronic small vessel ischemic disease, stable as compared to the brain MRI of 11/06/2019. 3. 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 4. Left parietooccipital scalp soft tissue swelling. MRA head: 1. No intracranial large  vessel occlusion. 2. Intracranial atherosclerotic disease most notably as follows. 3. 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 4. Moderate focal stenosis within a proximal M2 left MCA branch vessel. 5. Mild/moderate stenosis of the left PCA at the P3/P4 junction. Electronically Signed   By: Kellie Simmering DO   On: 02/08/2020 07:49   MR ANGIO NECK WO CONTRAST  Result Date: 02/08/2020 CLINICAL DATA:  Stroke, follow-up. EXAM: MRA NECK WITHOUT CONTRAST TECHNIQUE: Angiographic images of the neck were obtained using MRA technique without intravenous contrast. Carotid stenosis measurements (when applicable) are obtained  utilizing NASCET criteria, using the distal internal carotid diameter as the denominator. COMPARISON:  MRA neck 09/26/2009. FINDINGS: The right CCA and cervical ICA are patent without hemodynamically significant stenosis. Mild atherosclerotic irregularity of the proximal right ICA, progressed as compared to the MRA of 09/26/2009. The left CCA and cervical ICA are patent. On the source time-of-flight sequence, there is apparent 60% stenosis at the origin of the left ICA, progressed as compared to the prior exam. However, this degree of stenosis is not definitively appreciable on the MIP reconstructions. Limited evaluation of the proximal vertebral arteries due to motion degradation and noncontrast technique. The vertebral arteries are otherwise patent within the neck without hemodynamically significant stenosis. IMPRESSION: 1. Apparent 60% stenosis at the origin of the left ICA on the source time-of-flight sequence, progressed from the MRA of 09/26/2009. However, this degree of stenosis is not definitively appreciated on the MIP reconstructions. Carotid artery duplex may be obtained for further evaluation, as clinically warranted. 2. The right CCA and ICA are patent within the neck without hemodynamically significant stenosis. Progressive mild atherosclerotic irregularity of the proximal right ICA. 3. Motion degradation and non-contrast technique preclude evaluation of the proximal vertebral arteries. The vertebral arteries are otherwise patent within the neck without significant stenosis. Electronically Signed   By: Kellie Simmering DO   On: 02/08/2020 08:04   MR BRAIN WO CONTRAST  Result Date: 02/08/2020 CLINICAL DATA:  Neuro deficit, acute, stroke suspected. Additional history provided: Fall at home. EXAM: MRI HEAD WITHOUT CONTRAST MRA HEAD WITHOUT CONTRAST TECHNIQUE: Multiplanar, multiecho pulse sequences of the brain and surrounding structures were obtained without intravenous contrast. Angiographic images of  the head were obtained using MRA technique without contrast. COMPARISON:  Noncontrast head CT 02/08/2020. Brain MRI 11/06/2019. MRA head 09/26/2009. FINDINGS: MRI HEAD FINDINGS Brain: Cerebral volume is normal for age. There is a thin acute subdural hematoma overlying the anterolateral right frontal lobe measuring up to 4 mm in greatest thickness (for instance as seen on series 11, image 13). In retrospect, this is appreciable on the head CT performed earlier the same day. No significant mass effect upon the underlying right cerebral hemisphere. No midline shift. Moderate multifocal T2/FLAIR hyperintensity within the cerebral white matter and pons is nonspecific, but compatible with chronic small vessel ischemic disease. Unchanged 5 mm rounded extra-axial calcified focus overlying the anterior left frontal lobe, suspected small incidental meningioma. There is no acute infarct. No extra-axial fluid collection. Vascular: Reported below. Skull and upper cervical spine: No focal marrow lesion. Cervical spondylosis. Ligamentous hypertrophy/pannus formation posterior to the dens. Sinuses/Orbits: Prior lens replacements. Trace ethmoid sinus mucosal thickening. Other: Left parietooccipital scalp soft tissue swelling. MRA HEAD FINDINGS The intracranial internal carotid arteries are patent. Moderate/severe atherosclerotic narrowing of the cavernous segments bilaterally, progressed as compared to the MRA of 09/26/2009. No M2 proximal branch occlusion. Atherosclerotic irregularity of the M2 and  more distal MCA branch vessels bilaterally. Most notably, there is a moderate stenosis within a proximal M2 left MCA branch (series 1051, image 4). The anterior cerebral arteries are patent. The intracranial vertebral arteries are patent. The basilar artery is patent. The posterior cerebral arteries are patent. Mild atherosclerotic narrowing of the proximal left PCA (series 1039, image 15). Mild/moderate narrowing of the left PCA at the  P3/P4 junction. Posterior communicating arteries are present bilaterally. These results were called by telephone at the time of interpretation on 02/08/2020 at 7:48 am to provider Dr. Roslynn Amble, who verbally acknowledged these results. IMPRESSION: MRI brain: 1. Thin acute subdural hematoma overlying the anterolateral right frontal lobe, measuring up to 4 mm in thickness. 2. Moderate chronic small vessel ischemic disease, stable as compared to the brain MRI of 11/06/2019. 3. 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 4. Left parietooccipital scalp soft tissue swelling. MRA head: 1. No intracranial large vessel occlusion. 2. Intracranial atherosclerotic disease most notably as follows. 3. 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 4. Moderate focal stenosis within a proximal M2 left MCA branch vessel. 5. Mild/moderate stenosis of the left PCA at the P3/P4 junction. Electronically Signed   By: Kellie Simmering DO   On: 02/08/2020 07:49   Pelvis Portable  Result Date: 02/10/2020 CLINICAL DATA:  LEFT hip arthroplasty EXAM: PORTABLE PELVIS 1-2 VIEWS COMPARISON:  February 08, 2020 FINDINGS: Status post LEFT hip arthroplasty. Orthopedic hardware is intact and without periprosthetic fracture or lucency. Osteopenia. No additional acute fracture or dislocation noted on limited views. LEFT-sided subcutaneous air. Foley catheter. Surgical clips project over the LEFT groin. IMPRESSION: Expected postsurgical changes status post LEFT hip arthroplasty. Electronically Signed   By: Valentino Saxon MD   On: 02/10/2020 18:50   DG Pelvis Portable  Result Date: 02/08/2020 CLINICAL DATA:  Pelvic trauma EXAM: PORTABLE PELVIS 1-2 VIEWS COMPARISON:  None. FINDINGS: Single view radiograph the pelvis demonstrates an acute, left subcapital femoral neck fracture with superior displacement, varus angulation,  and external rotation of the distal fracture fragment. The femoral head is still seated within the left acetabulum. Mild bilateral hip joint space narrowing is present in keeping with mild bilateral degenerative arthritis. Pelvis and sacrum appear intact. Limited evaluation of the right hip is unremarkable. IMPRESSION: Acute, displaced, angulated subcapital left femoral neck fracture. Electronically Signed   By: Fidela Salisbury MD   On: 02/08/2020 02:44   DG Chest Port 1 View  Result Date: 02/08/2020 CLINICAL DATA:  Chest trauma EXAM: PORTABLE CHEST 1 VIEW COMPARISON:  10/04/2018 FINDINGS: The heart size and mediastinal contours are within normal limits. Both lungs are clear. The visualized skeletal structures are unremarkable. IMPRESSION: No active disease. Electronically Signed   By: Fidela Salisbury MD   On: 02/08/2020 02:42   ECHOCARDIOGRAM COMPLETE  Result Date: 02/08/2020    ECHOCARDIOGRAM REPORT   Patient Name:   [facility] GARNER Finigan Date of Exam: 02/08/2020 Medical Rec #:  [id number]        Height:       66.0 in Accession #:    [id number]       Weight:       189.0 lb Date of Birth:  [birth year]        BSA:          1.952 m Patient Age:    19 years         BP:  107/59 mmHg Patient Gender: F                HR:           76 bpm. Exam Location:  Inpatient Procedure: 2D Echo, Cardiac Doppler and Color Doppler Indications:    Stroke 434.91 / I163.9  History:        Patient has no prior history of Echocardiogram examinations.                 CAD; Risk Factors:Diabetes.  Sonographer:    Bernadene Person RDCS Referring Phys: [id number] ERIC J [place] IMPRESSIONS  1. Left ventricular ejection fraction, by estimation, is 60 to 65%. The left ventricle has normal function. The left ventricle has no regional wall motion abnormalities. There is mild left ventricular hypertrophy. Left ventricular diastolic parameters are consistent with Grade I diastolic dysfunction (impaired relaxation).  2. Right ventricular systolic  function is normal. The right ventricular size is normal. There is normal pulmonary artery systolic pressure. The estimated right ventricular systolic pressure is 62.8 mmHg.  3. The mitral valve is normal in structure. Trivial mitral valve regurgitation. No evidence of mitral stenosis.  4. The aortic valve is tricuspid. Aortic valve regurgitation is not visualized. No aortic stenosis is present.  5. The inferior vena cava is normal in size with <50% respiratory variability, suggesting right atrial pressure of 8 mmHg. FINDINGS  Left Ventricle: Left ventricular ejection fraction, by estimation, is 60 to 65%. The left ventricle has normal function. The left ventricle has no regional wall motion abnormalities. The left ventricular internal cavity size was normal in size. There is  mild left ventricular hypertrophy. Left ventricular diastolic parameters are consistent with Grade I diastolic dysfunction (impaired relaxation). Right Ventricle: The right ventricular size is normal. No increase in right ventricular wall thickness. Right ventricular systolic function is normal. There is normal pulmonary artery systolic pressure. The tricuspid regurgitant velocity is 2.18 m/s, and  with an assumed right atrial pressure of 8 mmHg, the estimated right ventricular systolic pressure is 31.5 mmHg. Left Atrium: Left atrial size was normal in size. Right Atrium: Right atrial size was normal in size. Pericardium: Trivial pericardial effusion is present. Mitral Valve: The mitral valve is normal in structure. Trivial mitral valve regurgitation. No evidence of mitral valve stenosis. Tricuspid Valve: The tricuspid valve is normal in structure. Tricuspid valve regurgitation is trivial. Aortic Valve: The aortic valve is tricuspid. Aortic valve regurgitation is not visualized. No aortic stenosis is present. Pulmonic Valve: The pulmonic valve was normal in structure. Pulmonic valve regurgitation is not visualized. Aorta: The aortic root is  normal in size and structure. Venous: The inferior vena cava is normal in size with less than 50% respiratory variability, suggesting right atrial pressure of 8 mmHg. IAS/Shunts: No atrial level shunt detected by color flow Doppler.  LEFT VENTRICLE PLAX 2D LVIDd:         3.90 cm  Diastology LVIDs:         2.70 cm  LV e' medial:    6.48 cm/s LV PW:         0.80 cm  LV E/e' medial:  13.3 LV IVS:        1.40 cm  LV e' lateral:   7.80 cm/s LVOT diam:     1.80 cm  LV E/e' lateral: 11.1 LV SV:         75 LV SV Index:   39 LVOT Area:     2.54 cm  RIGHT  VENTRICLE RV S prime:     11.10 cm/s TAPSE (M-mode): 1.7 cm LEFT ATRIUM             Index       RIGHT ATRIUM           Index LA diam:        3.00 cm 1.54 cm/m  RA Area:     16.10 cm LA Vol (A2C):   47.1 ml 24.13 ml/m RA Volume:   41.60 ml  21.31 ml/m LA Vol (A4C):   39.2 ml 20.08 ml/m LA Biplane Vol: 42.7 ml 21.87 ml/m  AORTIC VALVE LVOT Vmax:   146.00 cm/s LVOT Vmean:  102.000 cm/s LVOT VTI:    0.296 m  AORTA Ao Root diam: 3.30 cm Ao Asc diam:  3.40 cm MITRAL VALVE                TRICUSPID VALVE MV Area (PHT): 2.69 cm     TR Peak grad:   19.0 mmHg MV Decel Time: 282 msec     TR Vmax:        218.00 cm/s MV E velocity: 86.50 cm/s MV A velocity: 102.00 cm/s  SHUNTS MV E/A ratio:  0.85         Systemic VTI:  0.30 m                             Systemic Diam: 1.80 cm Loralie Champagne MD Electronically signed by Loralie Champagne MD Signature Date/Time: 02/08/2020/3:32:08 PM    Final     Microbiology: Recent Results (from the past 240 hour(s))  Respiratory Panel by RT PCR (Flu A&B, Covid) - Nasopharyngeal Swab     Status: None   Collection Time: 02/08/20  3:42 AM   Specimen: Nasopharyngeal Swab  Result Value Ref Range Status   SARS Coronavirus 2 by RT PCR NEGATIVE NEGATIVE Final    Comment: (NOTE) SARS-CoV-2 target nucleic acids are NOT DETECTED.  The SARS-CoV-2 RNA is generally detectable in upper respiratoy specimens during the acute phase of infection. The  lowest concentration of SARS-CoV-2 viral copies this assay can detect is 131 copies/mL. A negative result does not preclude SARS-Cov-2 infection and should not be used as the sole basis for treatment or other patient management decisions. A negative result may occur with  improper specimen collection/handling, submission of specimen other than nasopharyngeal swab, presence of viral mutation(s) within the areas targeted by this assay, and inadequate number of viral copies (<131 copies/mL). A negative result must be combined with clinical observations, patient history, and epidemiological information. The expected result is Negative.  Fact Sheet for Patients:  PinkCheek.be  Fact Sheet for Healthcare Providers:  GravelBags.it  This test is no t yet approved or cleared by the [place] FDA and  has been authorized for detection and/or diagnosis of SARS-CoV-2 by FDA under an Emergency Use Authorization (EUA). This EUA will remain  in effect (meaning this test can be used) for the duration of the COVID-19 declaration under Section 564(b)(1) of the Act, 21 U.S.C. section 360bbb-3(b)(1), unless the authorization is terminated or revoked sooner.     Influenza A by PCR NEGATIVE NEGATIVE Final   Influenza B by PCR NEGATIVE NEGATIVE Final    Comment: (NOTE) The Xpert Xpress SARS-CoV-2/FLU/RSV assay is intended as an aid in  the diagnosis of influenza from Nasopharyngeal swab specimens and  should not be used as a sole basis for treatment. Nasal  washings and  aspirates are unacceptable for Xpert Xpress SARS-CoV-2/FLU/RSV  testing.  Fact Sheet for Patients: PinkCheek.be  Fact Sheet for Healthcare Providers: GravelBags.it  This test is not yet approved or cleared by the [place] FDA and  has been authorized for detection and/or diagnosis of SARS-CoV-2 by  FDA under  an Emergency Use Authorization (EUA). This EUA will remain  in effect (meaning this test can be used) for the duration of the  Covid-19 declaration under Section 564(b)(1) of the Act, 21  U.S.C. section 360bbb-3(b)(1), unless the authorization is  terminated or revoked. Performed at [facility], [place] [address], [place], [place] [address]   Surgical pcr screen     Status: None   Collection Time: 02/08/20  5:42 PM   Specimen: Nasal Mucosa; Nasal Swab  Result Value Ref Range Status   MRSA, PCR NEGATIVE NEGATIVE Final   Staphylococcus aureus NEGATIVE NEGATIVE Final    Comment: (NOTE) The Xpert SA Assay (FDA approved for NASAL specimens in patients 66 years of age and older), is one component of a comprehensive surveillance program. It is not intended to diagnose infection nor to guide or monitor treatment. Performed at [facility], [place] [address], [place], [place] [address]   Culture, Urine     Status: None   Collection Time: 02/13/20  1:00 AM   Specimen: Urine, Random  Result Value Ref Range Status   Specimen Description URINE, RANDOM  Final   Special Requests NONE  Final   Culture   Final    NO GROWTH Performed at [facility], [place] [address], [place],  [address]    Report Status 02/14/2020 FINAL  Final     Labs: Basic Metabolic Panel: Recent Labs  Lab 02/09/20 0403 02/10/20 0246 02/11/20 3875 02/12/20 0309 02/13/20 0126 02/14/20 0101 02/15/20 0358  NA 138   < > 137 136 136 138 140  K 4.7   < > 4.7 4.0 4.1 3.8 3.4*  CL 101   < > 101 100 103 103 106  CO2 30   < > 27 26 24 25 27   GLUCOSE 130*   < > 213* 175* 177* 196* 180*  BUN 23   < > 24* 34* 40* 42* 47*  CREATININE 1.69*   < > 1.56* 1.59* 1.52* 1.45* 1.35*  CALCIUM 9.0   < > 9.1 8.9 8.8* 8.8* 8.5*  MG 2.2  --  2.5*  --   --  2.5*  --    < > = values in this interval not displayed.   Liver Function Tests: No results for input(s): AST, ALT, ALKPHOS, BILITOT, PROT, ALBUMIN in the last  168 hours. No results for input(s): LIPASE, AMYLASE in the last 168 hours. No results for input(s): AMMONIA in the last 168 hours. CBC: Recent Labs  Lab 02/11/20 0623 02/12/20 0309 02/13/20 0126 02/14/20 0101 02/15/20 0358  WBC 12.6* 14.4* 12.6* 12.4* 12.8*  HGB 11.8* 11.5* 11.1* 10.1* 8.9*  HCT 37.3 36.7 35.0* 31.3* 28.3*  MCV 90.8 90.2 89.5 88.7 90.1  PLT 160 189 181 197 243   Cardiac Enzymes: No results for input(s): CKTOTAL, CKMB, CKMBINDEX, TROPONINI in the last 168 hours. BNP: BNP (last 3 results) Recent Labs    02/08/20 0801  BNP 35.1    CBG: Recent Labs  Lab 02/14/20 1159 02/14/20 1628 02/14/20 2125 02/15/20 0749 02/15/20 1148  GLUCAP 157* 150* 162* 211* 164*      Time spent: 35 minutes  Signed:  Para Skeans MD.  [facility] 02/15/2020, 3:00 PM

## 2020-02-15 NOTE — Progress Notes (Signed)
Pt refused urecholine. Pt and Pt's family state Pt takes 5mg  valium at home to help relax and for urinary retention. Pt requested to take the valium instead of the urecholine scheduled at 1600.

## 2020-02-15 NOTE — Progress Notes (Signed)
Pt admitted to unit from 4N via bed with RN and NT. She is alert and oriented to person and place and time. No acute distress and denies pain. Pt noted on 0.5 L of O2 [place]. Denies resp distress/SOB. Skin is warm and dry and intact. Surgical incision noted. No belongings bedside at this time. Pt does have upper/lower dentures. Pt orientated to room and staff.

## 2020-02-15 NOTE — Progress Notes (Signed)
Inpatient Rehabilitation-Admissions Coordinator   Pt's family notified of bed offer and they have accepted. Reviewed consent forms and insurance benefit letter with pt's spouse. All questions answered. Received medical clearance from [name] for admit to CIR today.  RN and [facility] team aware of plan.   Raechel Ache, OTR/L  Rehab Admissions Coordinator  [phone] 02/15/2020 11:57 AM

## 2020-02-15 NOTE — Progress Notes (Signed)
Inpatient Rehabilitation Medication Review by a Pharmacist  A complete drug regimen review was completed for this patient to identify any potential clinically significant medication issues.  Clinically significant medication issues were identified:  yes  Prior to admission she was taking Aromasin 25 mg po daily for history of breast cancer and Remeron 15 mg at bedtime.  Also taking bethanecol as an inpatient - does this need to continue?   For non-urgent medication issues to be resolved on team rounds tomorrow morning a [facility] Secure Chat Handoff was sent to: Dr Posey Pronto   Time spent performing this drug regimen review (minutes):  20 minutes   Tad Moore 02/15/2020 6:31 PM

## 2020-02-15 NOTE — H&P (Signed)
Physical Medicine and Rehabilitation Admission H&P    Chief Complaint  Patient presents with  . Left hip fracture with functional deficits.     HPI: [name] is an 79 year old RH-female with history of T2DM with retinopathy and nephropathy- CKD IV ([name]), CAD s/p stent, chronic diastolic CHF- chronic BLE edema, peripheral neuropathy who was admitted on 02/08/20 after sustaining a fall while getting out of the shower. She had onset of left hip pain as well as scalp contusion but No LOC or CP. She was found to have acute left subcapital femoral neck fracture with displacement and angulation. CT head with subacute/remote cortical infarct right frontal lobe with advanced atlantodental and cervical DDD.  MRI brain done and revealed thin acute right frontal lobe SDH with moderate stable small vessel disease and suspected 5 mm incidental meningioma. MRA brain ordered due to concerns of stroke and showed moderate to severe stenosis bilateral cavernous ICA, moderate focal stenosis proximal M2 L-MCA and mild/moderate stenosis L-PCA at P3/P4 junction.   [name] consulted for input and questioned if SDH present as patient without neurological symptoms and no follow up recommended. She underwent left hip bipolar arthroplasty by [name] on 11/14. Post op to be WBAT and on ASA/Plavix for stroke and DVT prophylaxis.  Post op with ABLA treated with one unit PRBC as well as acute hypoxic respiratory failure requiring venturi mask briefly, DOE, anxiety issues--on valium prn as well as urinary retention with I/O caths at [phone] cc. She was started on urecholine and foley placed 11/18.  Hypoxia felt to be due to fluid overload and treated with IV lasix urine. Respiratory status improved being weaned off oxygen. Therapy ongoing and patient limited by dizziness, fatigue as well as inability to recall hip precautions. Rehab CM also reporting that patient had transient episodes audible hallucinations on  her past two visits. CIR recommended due to functional decline.    Review of Systems  Constitutional: Negative for chills and fever.  HENT: Negative for hearing loss and tinnitus.   Eyes: Negative for blurred vision and double vision.  Respiratory: Positive for shortness of breath. Negative for cough and stridor.   Cardiovascular: Positive for palpitations and leg swelling. Negative for chest pain.  Gastrointestinal: Positive for abdominal pain, heartburn and nausea.  Genitourinary:       Gets up 1-2 times at nights to urinate.   Musculoskeletal: Positive for joint pain and myalgias.  Neurological: Negative for dizziness and headaches.  Psychiatric/Behavioral: Positive for memory loss (mild cognitive deficits at baseline). The patient has insomnia (Got up on and off at nights PTA. ).      Past Medical History:  Diagnosis Date  . Anemia    have received iron infusions  . Anxiety   . Arthritis     osteoarthritis  . Breast cancer ([place])   . Breast cancer in female [facility])    Bilateral  . CAD S/P percutaneous coronary angioplasty 2007; March 2011   [facility] [facility] 3.0 mm 20 mm postdilated 3.6 mm in early mid LAD; status post ISR Cutting Balloon PTCA and March 11 along with PCI of distal mid lesion with a 3.0 mm 12 mm MultiLink vision BMS; the proximal stent causes jailing of SP1 and SP2 with ostial 70-80% lesions  . Cancer (HCC)    Melanoma, Squamous cell Carcinoma  . Charcot's joint of foot, right   . Charcot's joint of left foot   . Chronic diastolic CHF (congestive heart failure),  NYHA class 2 ([place]) 02/28/2018  . Chronic kidney disease    stage 2   . Coronary artery disease   . Dementia ([place])    mild  . Diabetes mellitus type 2 with neurological manifestations ([place])   . Diabetes mellitus without complication ([place])   . Dyslipidemia, goal LDL below 70   . Foot pain, bilateral   . Full dentures   . Genetic testing 12/03/2016   Germline genetic testing was performed through [facility]  Common Hereditary Cancers Panel + [facility] Melanoma Panel. This custom panel includes analysis of the following 51 genes: APC, ATM, AXIN2, BAP1, BARD1, BMPR1A, BRCA1, BRCA2, BRIP1, CDH1, CDK4, CDKN2A, CHEK2, CTNNA1, DICER1, EPCAM, GREM1, HOXB13, KIT, MEN1, MITF, MLH1, MSH2, MSH3, MSH6, MUTYH, NBN, NF1, NTHL1, PALB2, PDGFRA, PMS2, POLD1, POL  . GERD (gastroesophageal reflux disease)   . Gout   . Headache(784.0)   . History of hematuria    Followed by Dr. Gaynelle Arabian  . Hypertension, essential, benign   . Migraine   . Mild cognitive impairment   . Peripheral neuropathy   . Personal history of radiation therapy   . Post herpetic neuralgia   . Restless leg syndrome   . Stroke ([place])   . TIA (transient ischemic attack)    multiple in the past  . TIA (transient ischemic attack)   . Wears glasses     Past Surgical History:  Procedure Laterality Date  . ABDOMINAL HYSTERECTOMY    . ANKLE FUSION Right 02/22/2018   Procedure: RIGHT SUBTALAR AND TALONAVICULAR FUSION;  Surgeon: Newt Minion, MD;  Location: [place];  Service: Orthopedics;  Laterality: Right;  . APPENDECTOMY    . BLADDER SUSPENSION    . BREAST LUMPECTOMY Bilateral 2018  . BREAST LUMPECTOMY WITH RADIOACTIVE SEED AND SENTINEL LYMPH NODE BIOPSY Bilateral 12/14/2016   Procedure: BILATERAL BREAST LUMPECTOMY WITH BILATERAL  RADIOACTIVE SEED AND BILATERAL AXILLARY  SENTINEL LYMPH NODE BIOPSY ERAS PATHWAY;  Surgeon: Alphonsa Overall, MD;  Location: [place];  Service: General;  Laterality: Bilateral;  PECTORAL BLOCK  . CARDIAC CATHETERIZATION  02/24/2006   85% stenosis in the proximal portion of LAD-arrangements made for PCI on 02/25/2006  . CARDIAC CATHETERIZATION  02/25/2006   80% LAD lesion stented with a 3x51m Liberte stent resulting in reduction of 80% lesion to 0% residual  . CARDIAC CATHETERIZATION  04/26/2006   Medical management  . CARDIAC CATHETERIZATION  06/24/2009   60-70% re-stenosis in the proximal LAD. A 3.25x15 cutting balloon,  3 inflations - 14atm-38sec, 13atm-39sec, and 12atm-40sec reduced to less than 10%. 60% stenosis of the mid/distal LAD stented with a 3x152mMultilink stent.  . Marland KitchenARDIAC CATHETERIZATION  07/09/2009   Medical management  . CARDIAC CATHETERIZATION    . CAROTID DOPPLER  06/24/2009   40-59% R ICA stenosis. No significant ICA stenosis noted  . CHOLECYSTECTOMY    . CORONARY ANGIOPLASTY    . DILATION AND CURETTAGE OF UTERUS    . HIP ARTHROPLASTY Left 02/10/2020   Procedure: ARTHROPLASTY BIPOLAR HIP (HEMIARTHROPLASTY) POSTERIOR LATERAL;  Surgeon: MuRenette ButtersMD;  [place] Service: Orthopedics;  Laterality: Left;  . JOINT REPLACEMENT Left   . LESION REMOVAL Right 03/11/2015   Procedure:  EXCISION OF RIGHT PRE TIBIAL LESION;  Surgeon: JaJudeth HornMD;  [place] Service: General;  Laterality: Right;  . MASS EXCISION Left 06/10/2014   Procedure: EXCISION LEFT LOWER LEG LESION;  Surgeon: JaDoreen SalvageMD;  [place] Service: General;  Laterality: Left;  Marland Kitchen MASS EXCISION Left 09/05/2015   Procedure: EXCISION OF LEFT FOREARM  MASS;  Surgeon: Judeth Horn, MD;  Location: [place];  Service: General;  Laterality: Left;  . NM MYOVIEW LTD  10/2011   No ischemia or infarction.  Normal EF.  LOW RISK. (Short bursts of 5 beats NSVT during recovery otherwise normal)  . SHOULDER ARTHROSCOPY  10/15  . SHOULDER ARTHROSCOPY  1995   left  . TEE WITHOUT CARDIOVERSION N/A 08/03/2017   Procedure: TRANSESOPHAGEAL ECHOCARDIOGRAM (TEE);  Surgeon: Skeet Latch, MD;  Location: [place];  Service: Cardiovascular;; EF 60-65% with no wall motion normalities.  No atrial thrombus noted.  Lightly findings consistent with a lipomatous hypertrophy of the atrial septum.   . TENDON REPAIR Left 05/12/2016   Procedure: Left Anterior Tibial Tendon Reconstruction;  Surgeon: Newt Minion, MD;  Location: [place];  Service: Orthopedics;  Laterality: Left;  .  TOTAL KNEE ARTHROPLASTY  2005   left  . TRANSESOPHAGEAL ECHOCARDIOGRAM  08/03/2016   : EF 60-65% no wall motion normalities.  No atrial thrombus.  Lipomatous hypertrophy of the atrial septum.  No mass noted.  . TRANSESOPHAGEAL ECHOCARDIOGRAM  07/2017   EF 60 to 65%.  No R WMA.  No atrial thrombus noted.  Lipomatous IAS.  Marland Kitchen TRANSTHORACIC ECHOCARDIOGRAM  02/25/2017   :EF 65-70%.  GR 1 DD.  No or WMA.  Moderate aortic calcification/sclerosis but no stenosis.  . TRANSTHORACIC ECHOCARDIOGRAM  07/2017   Vigorous LV function.  EF 65-70%.  Mild diastolic dysfunction with no RWMA. GR 1 DD.  Mild aortic sclerosis/no stenosis.  Questionable right atrial mass discounted with TEE)   . WRIST ARTHROPLASTY  2010   cancer lt wrist    Family History  Problem Relation Age of Onset  . Diabetes Mother   . Kidney disease Mother   . Diabetes Sister   . Diabetes Brother   . Hypertension Mother   . Hypertension Father   . Emphysema Father   . Heart attack Father   . Heart disease Father   . Arthritis Sister   . Diabetes Sister   . Hypertension Sister   . Breast cancer Sister 42       treated with mastectomy  . Cervical cancer Sister 72  . Hypertension Brother   . Hyperlipidemia Brother   . Diabetes Brother   . Stroke Brother   . Diabetes Sister   . Hypertension Sister   . Stomach cancer Sister 48  . Hypertension Brother   . ALS Brother        d.62s  . Breast cancer Daughter 3       triple negative treated with lumpectomy, chemo, radiation  . Heart attack Daughter 26       d.45  . COPD Daughter   . Cancer Paternal Aunt        unspecified type  . Cancer Paternal Uncle        unspecified type  . Cancer Paternal Uncle        unspecified type    Social History: Is married--husband manages medications. She was independent with cane or walker/wheelchair on bad days to prevent falls. She reports that she has never smoked. She has never used smokeless tobacco. She reports that she does not drink  alcohol and does not use drugs.    Allergies:  Allergies  Allergen Reactions  . Nsaids Nausea And Vomiting and Palpitations  . Reglan [Metoclopramide] Other (See Comments)  Seizures   . Tramadol Other (See Comments)    Seizures   . Ultram [Tramadol] Other (See Comments)    Pt has seizure activity with this medication  . Lipitor [Atorvastatin] Other (See Comments)    Bone and muscle pain  . Lipitor [Atorvastatin] Swelling and Other (See Comments)    Pain   . Lyrica [Pregabalin] Other (See Comments)    Weight gain, extremity swelling  . Lyrica [Pregabalin] Swelling  . Reglan [Metoclopramide] Other (See Comments)    Tardive dyskinesia; paradoxical reaction not relieved by benadryl  . Nsaids Swelling    SWELLING REACTION UNSPECIFIED   . Urecholine [Bethanechol]     Palpitations and nausea      Medications Prior to Admission  Medication Sig Dispense Refill  . BIOTIN PO Take 1 tablet by mouth daily.    . Cholecalciferol (VITAMIN D) 50 MCG (2000 UT) tablet Take 2,000 Units by mouth daily.    . clopidogrel (PLAVIX) 75 MG tablet Take 75 mg by mouth daily.    . Coenzyme Q10 (COQ-10 PO) Take 1 tablet by mouth daily.    . colchicine 0.6 MG tablet Take 0.6 mg by mouth daily as needed (for up to 3 days for gout flare).    . CRANBERRY-CALCIUM PO Take 1 tablet by mouth daily.    . diazepam (VALIUM) 5 MG tablet Take 5 mg by mouth 3 (three) times daily as needed.    . febuxostat (ULORIC) 40 MG tablet Take 40 mg by mouth daily.    Marland Kitchen gabapentin (NEURONTIN) 300 MG capsule Take 300 mg by mouth 2 (two) times daily.     Marland Kitchen JANUVIA 25 MG tablet Take 25 mg by mouth daily.    Marland Kitchen KRILL OIL PO Take 1 capsule by mouth daily.    . Methylfol-Methylcob-Acetylcyst (CEREFOLIN NAC) 6-2-600 MG TABS Take 1 tablet by mouth in the morning.    . metolazone (ZAROXOLYN) 2.5 MG tablet Take 2.5 mg by mouth daily. 30 minutes before Bumetanide Mon. & Thurs.    . metoprolol succinate (TOPROL-XL) 25 MG 24 hr tablet Take  25 mg by mouth at bedtime.     . mirtazapine (REMERON) 15 MG tablet Take 15 mg by mouth at bedtime.     . nitroGLYCERIN (NITROLINGUAL) 0.4 MG/SPRAY spray Place 1 spray under the tongue every 5 (five) minutes x 3 doses as needed for chest pain.    . pramipexole (MIRAPEX) 0.25 MG tablet Take 0.75 mg by mouth at bedtime.     Marland Kitchen Resveratrol 250 MG CAPS Take 500 mg by mouth daily.    . rosuvastatin (CRESTOR) 20 MG tablet Take 20 mg by mouth daily.     Drug Regimen Review  Drug regimen was reviewed and remains appropriate with no significant issues identified  Home: Home Living Family/patient expects to be discharged to:: Inpatient rehab Living Arrangements: Spouse/significant other Additional Comments: Pt lives in ones story home with 3 steps to enter, has both tub-shower and walk in shower with built-in seats and grab bars. Has a RW and Cane   Functional History: Prior Function Level of Independence: Independent with assistive device(s) (pt reports furniture walking or intermittent use of cane) Comments: Pt could not drive but was able to do most other adls other than cooking bc her husband does this. Pt reports use of cane or furniture as RW does not fit inher house   Functional Status:  Mobility: Bed Mobility Overal bed mobility: Needs Assistance Bed Mobility: Supine to Sit, Sit to  Supine Supine to sit: Max assist, +2 for physical assistance Sit to supine: Total assist, +2 for physical assistance General bed mobility comments: Up in chair upon arrival. Transfers Overall transfer level: Needs assistance Equipment used: Rolling walker (2 wheeled) Transfers: Sit to/from Stand Sit to Stand: Mod assist General transfer comment: Assist to power to standing with cues for hand placement, slow to rise, cues to stand fully upright, stated dizziness but BP improved with O2 remaining stable at 1L Ambulation/Gait Ambulation/Gait assistance: Min assist Gait Distance (Feet): 3 Feet Assistive  device: Rolling walker (2 wheeled) Gait Pattern/deviations: Step-through pattern, Decreased stride length General Gait Details: pt with limited lateral stepping to bed from recliner. Reports 7/10 fatigue but also stating she would like to walk to bathroom.  Gait velocity interpretation: <1.31 ft/sec, indicative of household ambulator   ADL: ADL Overall ADL's : Needs assistance/impaired Eating/Feeding: Set up, Sitting, Cueing for safety Grooming: Wash/dry hands, Wash/dry face, Oral care, Set up, Bed level Grooming Details (indicate cue type and reason): Pt unable to stand at sink at this time. EOB only due to pain. Upper Body Bathing: Set up, Bed level, Cueing for safety Lower Body Bathing: Total assistance, +2 for physical assistance, Sitting/lateral leans, Adhering to hip precautions Lower Body Bathing Details (indicate cue type and reason): Pt with posterior hip precuations that she does not yet understand.  Unable to get into complete standing due to pain and increased heart rate.  Upper Body Dressing : Minimal assistance, Sitting, Cueing for compensatory techniques Lower Body Dressing: Total assistance, +2 for physical assistance, Adhering to hip precautions, Sit to/from stand, Cueing for compensatory techniques Lower Body Dressing Details (indicate cue type and reason): Pt requires further education with hip precuations and introduction to adaptive equipment. Toilet Transfer Details (indicate cue type and reason): attempted to stand to get to [facility]. Pt with increased HR to 126 and decreased O2 sats to 85% despite increasing O2 earlier in session to 3L. Toileting- Clothing Manipulation and Hygiene: Total assistance, +2 for physical assistance, Adhering to hip precautions, Sitting/lateral lean Functional mobility during ADLs: Moderate assistance, +2 for physical assistance, +2 for safety/equipment General ADL Comments: Pt unable to stand or complete any LE adls or toileting adls due to pain, and  dizziness, stating she just doesnt feel like herself, BP more stable in session to date   Cognition: Cognition Overall Cognitive Status: No family/caregiver present to determine baseline cognitive functioning Orientation Level: Oriented X4 Cognition Arousal/Alertness: Awake/alert Behavior During Therapy: Anxious Overall Cognitive Status: No family/caregiver present to determine baseline cognitive functioning General Comments: Remains anxious with dyspnea with mobility, also unable to state hip precuations, otherwise oriented and following commands  Blood pressure 101/60, pulse 98, temperature 98.4 F (36.9 C), resp. rate 15, height _0  (1.676 m), weight 89.4 kg, SpO2 94 %. Physical Exam  General: Anxious female. Increased WOB with conversation and bouts of anxiety.  On 2L oxygen per [place].   HEENT: Head is normocephalic, atraumatic, PERRLA, EOMI, sclera anicteric, oral mucosa pink and moist, dentition intact, ext ear canals clear,  Neck: Supple without JVD or lymphadenopathy Heart: Reg rate and rhythm. No murmurs rubs or gallops Chest: Tachycardia present.  Abdomen: Soft, non-tender, non-distended, bowel sounds positive. Extremities: 2+ lymphedema BLE.  Skin: Clean and intact without signs of breakdown Neuro: Anxious , distracted and mildly disoriented ---occasional inappropriate comments and daughter needed to cue patient to answer medical hx and to help calm the patient. No focal deficits. 4/5 strength in upper extremities and 3/5  lower extremities (limited by lymphedema). Psych: Pt's affect is appropriate. Pt is cooperative   Results for orders placed or performed during the hospital encounter of 02/15/20 (from the past 48 hour(s))  Glucose, capillary     Status: Abnormal   Collection Time: 02/15/20  4:47 PM  Result Value Ref Range   Glucose-Capillary 153 (H) 70 - 99 mg/dL    Comment: Glucose reference range applies only to samples taken after fasting for at least 8 hours.   No  results found.     Medical Problem List and Plan: 1.  Impaired mobility and ALDs secondary to subdural hemorrhage  -patient may shower  -ELOS/Goals:  2.  Antithrombotics: -DVT/anticoagulation:  Mechanical: Sequential compression devices, below knee Bilateral lower extremities  -antiplatelet therapy: Plavix 3. Neuropathy/charcot feet/Pain Management: Uses braces and gabapentin for pain.  4. Mood: LCSW to follow for evaluation and support.   -antipsychotic agents: N/A 5. Neuropsych: This patient is not fully capable of making decisions on her own behalf. 6. Skin/Wound Care: Monitor incision for healing. Continue supplements to promote wound healing.  7. Fluids/Electrolytes/Nutrition: Monitor I/O. Change glucerna to with meals.  8. Left femur fracture: WBAT with posterior hip precautions.  9. 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 SCr better but BUN with rise--> d/c Naprosyn.  Will order orthostatic vitals and monitor [place] with serial checks. . 10. CAD s/p PCI/stent: Monitor for symptoms with activity.  11. T2DM:  Hgb A1c-8.8.  Monitor BS ac/hs --resume Januvia and use SSI for elevated  BS. CBGs have been moderately elevated.  12. Chronic diastolic CHF: Monitor for signs of overload--order flutter valve for pulmonary hygiene. Her oxygen is being weaned off today--order prn use with activity. Will check weights daily. On metoprolol, Plavix, Crestor and Bumex resumed 11/17.  13. Urinary retention/Leucocytosis: Check UA/UCS--d/c foley in am and start bladder training. Discontinue urecholine-->on Valium (to relax bladder). UA was positive on 11/17 but culture showed no growth. 14. Acute blood loss anemia: Monitor H/H for trends--13.3-->8.8--> 8.9 15. Peripheral edema: 2+ edema-> good day for legs 11/19 per family.  16. Insomnia: Trazodone 25--61m HS PRN  PReesa Chew PA-C  I have personally performed a face to face diagnostic evaluation, including, but not limited to  relevant history and physical exam findings, of this patient and developed relevant assessment and plan.  Additionally, I have reviewed and concur with the physician assistant's documentation above.  KIzora Ribas MD 02/15/2020

## 2020-02-16 ENCOUNTER — Inpatient Hospital Stay (HOSPITAL_COMMUNITY): Payer: Medicare Other

## 2020-02-16 ENCOUNTER — Inpatient Hospital Stay (HOSPITAL_COMMUNITY): Payer: Medicare Other | Admitting: Speech Pathology

## 2020-02-16 ENCOUNTER — Inpatient Hospital Stay (HOSPITAL_COMMUNITY): Payer: Medicare Other | Admitting: Physical Therapy

## 2020-02-16 DIAGNOSIS — I6200 Nontraumatic subdural hemorrhage, unspecified: Secondary | ICD-10-CM | POA: Diagnosis not present

## 2020-02-16 DIAGNOSIS — S72002D Fracture of unspecified part of neck of left femur, subsequent encounter for closed fracture with routine healing: Secondary | ICD-10-CM | POA: Diagnosis not present

## 2020-02-16 LAB — CBC WITH DIFFERENTIAL/PLATELET
Abs Immature Granulocytes: 0.08 10*3/uL — ABNORMAL HIGH (ref 0.00–0.07)
Basophils Absolute: 0 10*3/uL (ref 0.0–0.1)
Basophils Relative: 0 %
Eosinophils Absolute: 0.4 10*3/uL (ref 0.0–0.5)
Eosinophils Relative: 3 %
HCT: 26.2 % — ABNORMAL LOW (ref 36.0–46.0)
Hemoglobin: 8.2 g/dL — ABNORMAL LOW (ref 12.0–15.0)
Immature Granulocytes: 1 %
Lymphocytes Relative: 11 %
Lymphs Abs: 1.5 10*3/uL (ref 0.7–4.0)
MCH: 28.2 pg (ref 26.0–34.0)
MCHC: 31.3 g/dL (ref 30.0–36.0)
MCV: 90 fL (ref 80.0–100.0)
Monocytes Absolute: 0.8 10*3/uL (ref 0.1–1.0)
Monocytes Relative: 6 %
Neutro Abs: 10.5 10*3/uL — ABNORMAL HIGH (ref 1.7–7.7)
Neutrophils Relative %: 79 %
Platelets: 261 10*3/uL (ref 150–400)
RBC: 2.91 MIL/uL — ABNORMAL LOW (ref 3.87–5.11)
RDW: 14.9 % (ref 11.5–15.5)
WBC: 13.3 10*3/uL — ABNORMAL HIGH (ref 4.0–10.5)
nRBC: 0 % (ref 0.0–0.2)

## 2020-02-16 LAB — COMPREHENSIVE METABOLIC PANEL
ALT: 22 U/L (ref 0–44)
AST: 30 U/L (ref 15–41)
Albumin: 2.2 g/dL — ABNORMAL LOW (ref 3.5–5.0)
Alkaline Phosphatase: 76 U/L (ref 38–126)
Anion gap: 8 (ref 5–15)
BUN: 47 mg/dL — ABNORMAL HIGH (ref 8–23)
CO2: 29 mmol/L (ref 22–32)
Calcium: 8.6 mg/dL — ABNORMAL LOW (ref 8.9–10.3)
Chloride: 100 mmol/L (ref 98–111)
Creatinine, Ser: 1.39 mg/dL — ABNORMAL HIGH (ref 0.44–1.00)
GFR, Estimated: 38 mL/min — ABNORMAL LOW (ref >60)
Glucose, Bld: 185 mg/dL — ABNORMAL HIGH (ref 70–99)
Potassium: 3.9 mmol/L (ref 3.5–5.1)
Sodium: 137 mmol/L (ref 135–145)
Total Bilirubin: 0.6 mg/dL (ref 0.3–1.2)
Total Protein: 5.3 g/dL — ABNORMAL LOW (ref 6.5–8.1)

## 2020-02-16 LAB — GLUCOSE, CAPILLARY
Glucose-Capillary: 166 mg/dL — ABNORMAL HIGH (ref 70–99)
Glucose-Capillary: 163 mg/dL — ABNORMAL HIGH (ref 70–99)
Glucose-Capillary: 168 mg/dL — ABNORMAL HIGH (ref 70–99)
Glucose-Capillary: 146 mg/dL — ABNORMAL HIGH (ref 70–99)

## 2020-02-16 MED ORDER — SENNOSIDES-DOCUSATE SODIUM 8.6-50 MG PO TABS
2.0000 | ORAL_TABLET | Freq: Two times a day (BID) | ORAL | Status: DC
  Administered 2020-02-16: 2 via ORAL
  Filled 2020-02-16: qty 2

## 2020-02-16 MED ORDER — SODIUM CHLORIDE 0.45 % IV SOLN
INTRAVENOUS | Status: DC
  Administered 2020-02-17: 75 mL/h via INTRAVENOUS

## 2020-02-16 NOTE — Progress Notes (Signed)
Pt continues to call out for complaints of stomach. Pt previously treated for nausea as well as gas pain. MD notified. New order for KUB.

## 2020-02-16 NOTE — Progress Notes (Signed)
Pt with hypotension episode w therapy session. RN called bedside. BP 89/49 HR 99 sats below 90. O2 replaced to 1l [place] and sats up to 96. Pt tolerating well. Pt does appear lethargic, but is able to answer questions correctly. Skin is warm and dry. Radial pulse regular.   Pt placed trendelenburg. Bladder scan performed at this time: >400 see I/O. Straight cath'd for a total of 550 ml of clear yellow urine no odor. Pt tolerated well. ABD does not appear distended and BS are present  To note, husband is bedside at this time.   BP recheck 91/52 HR 99 T 97.7 RR18 and sats 96. Pt remains responsive.  MD notified. New orders. See MAR.

## 2020-02-16 NOTE — Evaluation (Signed)
Occupational Therapy Assessment and Plan  Patient Details  Name: [name] MRN: [id number] Date of Birth: June 05, 1940  OT Diagnosis: abnormal posture, cognitive deficits and muscle weakness (generalized) Rehab Potential: Rehab Potential (ACUTE ONLY): Good ELOS: 16-21 days   Today's Date: 02/16/2020 OT Individual Time: 1300-1400 OT Individual Time Calculation (min): 60 min     Hospital Problem: Principal Problem:   Subdural hemorrhage ([place])   Past Medical History:  Past Medical History:  Diagnosis Date  . Anemia    have received iron infusions  . Anxiety   . Arthritis     osteoarthritis  . Breast cancer ([place])   . Breast cancer in female [facility])    Bilateral  . CAD S/P percutaneous coronary angioplasty 2007; March 2011   [facility] 3.0 mm 20 mm postdilated 3.6 mm in early mid LAD; status post ISR Cutting Balloon PTCA and March 11 along with PCI of distal mid lesion with a 3.0 mm 12 mm MultiLink vision BMS; the proximal stent causes jailing of SP1 and SP2 with ostial 70-80% lesions  . Cancer (HCC)    Melanoma, Squamous cell Carcinoma  . Charcot's joint of foot, right   . Charcot's joint of left foot   . Chronic diastolic CHF (congestive heart failure), NYHA class 2 ([place]) 02/28/2018  . Chronic kidney disease    stage 2   . Coronary artery disease   . Dementia ([place])    mild  . Diabetes mellitus type 2 with neurological manifestations ([place])   . Diabetes mellitus without complication ([place])   . Dyslipidemia, goal LDL below 70   . Foot pain, bilateral   . Full dentures   . Genetic testing 12/03/2016   Germline genetic testing was performed through [employer] Common Hereditary Cancers Panel + [employer]'s Melanoma Panel. This custom panel includes analysis of the following 51 genes: APC, ATM, AXIN2, BAP1, BARD1, BMPR1A, BRCA1, BRCA2, BRIP1, CDH1, CDK4, CDKN2A, CHEK2, CTNNA1, DICER1, EPCAM, GREM1, HOXB13, KIT, MEN1, MITF, MLH1, MSH2, MSH3, MSH6, MUTYH, NBN, NF1, NTHL1, PALB2,  PDGFRA, PMS2, POLD1, POL  . GERD (gastroesophageal reflux disease)   . Gout   . Headache(784.0)   . History of hematuria    Followed by Dr. Gaynelle Arabian  . Hypertension, essential, benign   . Migraine   . Mild cognitive impairment   . Peripheral neuropathy   . Personal history of radiation therapy   . Post herpetic neuralgia   . Restless leg syndrome   . Stroke ([place])   . TIA (transient ischemic attack)    multiple in the past  . TIA (transient ischemic attack)   . Wears glasses    Past Surgical History:  Past Surgical History:  Procedure Laterality Date  . ABDOMINAL HYSTERECTOMY    . ANKLE FUSION Right 02/22/2018   Procedure: RIGHT SUBTALAR AND TALONAVICULAR FUSION;  Surgeon: Newt Minion, MD;  Location: [place];  Service: Orthopedics;  Laterality: Right;  . APPENDECTOMY    . BLADDER SUSPENSION    . BREAST LUMPECTOMY Bilateral 2018  . BREAST LUMPECTOMY WITH RADIOACTIVE SEED AND SENTINEL LYMPH NODE BIOPSY Bilateral 12/14/2016   Procedure: BILATERAL BREAST LUMPECTOMY WITH BILATERAL  RADIOACTIVE SEED AND BILATERAL AXILLARY  SENTINEL LYMPH NODE BIOPSY ERAS PATHWAY;  Surgeon: Alphonsa Overall, MD;  Location: [place];  Service: General;  Laterality: Bilateral;  PECTORAL BLOCK  . CARDIAC CATHETERIZATION  02/24/2006   85% stenosis in the proximal portion of LAD-arrangements made for PCI on 02/25/2006  . CARDIAC CATHETERIZATION  02/25/2006   80%  LAD lesion stented with a 3x79mm [place] stent resulting in reduction of 80% lesion to 0% residual  . CARDIAC CATHETERIZATION  04/26/2006   Medical management  . CARDIAC CATHETERIZATION  06/24/2009   60-70% re-stenosis in the proximal LAD. A 3.25x15 cutting balloon, 3 inflations - 14atm-38sec, 13atm-39sec, and 12atm-40sec reduced to less than 10%. 60% stenosis of the mid/distal LAD stented with a 3x106mm Multilink stent.  Marland Kitchen CARDIAC CATHETERIZATION  07/09/2009   Medical management  . CARDIAC CATHETERIZATION    . CAROTID DOPPLER  06/24/2009   40-59% R ICA  stenosis. No significant ICA stenosis noted  . CHOLECYSTECTOMY    . CORONARY ANGIOPLASTY    . DILATION AND CURETTAGE OF UTERUS    . HIP ARTHROPLASTY Left 02/10/2020   Procedure: ARTHROPLASTY BIPOLAR HIP (HEMIARTHROPLASTY) POSTERIOR LATERAL;  Surgeon: [name], MD;  Location: [place];  Service: Orthopedics;  Laterality: Left;  . JOINT REPLACEMENT Left   . LESION REMOVAL Right 03/11/2015   Procedure:  EXCISION OF RIGHT PRE TIBIAL LESION;  Surgeon: Judeth Horn, MD;  Location: [place];  Service: General;  Laterality: Right;  . MASS EXCISION Left 06/10/2014   Procedure: EXCISION LEFT LOWER LEG LESION;  Surgeon: Doreen Salvage, MD;  Location: [place];  Service: General;  Laterality: Left;  Marland Kitchen MASS EXCISION Left 09/05/2015   Procedure: EXCISION OF LEFT FOREARM  MASS;  Surgeon: Judeth Horn, MD;  Location: [place];  Service: General;  Laterality: Left;  . NM MYOVIEW LTD  10/2011   No ischemia or infarction.  Normal EF.  LOW RISK. (Short bursts of 5 beats NSVT during recovery otherwise normal)  . SHOULDER ARTHROSCOPY  10/15  . SHOULDER ARTHROSCOPY  1995   left  . TEE WITHOUT CARDIOVERSION N/A 08/03/2017   Procedure: TRANSESOPHAGEAL ECHOCARDIOGRAM (TEE);  Surgeon: Skeet Latch, MD;  Location: [place];  Service: Cardiovascular;; EF 60-65% with no wall motion normalities.  No atrial thrombus noted.  Lightly findings consistent with a lipomatous hypertrophy of the atrial septum.   . TENDON REPAIR Left 05/12/2016   Procedure: Left Anterior Tibial Tendon Reconstruction;  Surgeon: Newt Minion, MD;  Location: [place];  Service: Orthopedics;  Laterality: Left;  . TOTAL KNEE ARTHROPLASTY  2005   left  . TRANSESOPHAGEAL ECHOCARDIOGRAM  08/03/2016   : EF 60-65% no wall motion normalities.  No atrial thrombus.  Lipomatous hypertrophy of the atrial septum.  No mass noted.  . TRANSESOPHAGEAL ECHOCARDIOGRAM  07/2017   EF 60 to 65%.  No R WMA.  No atrial  thrombus noted.  Lipomatous IAS.  Marland Kitchen TRANSTHORACIC ECHOCARDIOGRAM  02/25/2017   :EF 65-70%.  GR 1 DD.  No or WMA.  Moderate aortic calcification/sclerosis but no stenosis.  . TRANSTHORACIC ECHOCARDIOGRAM  07/2017   Vigorous LV function.  EF 65-70%.  Mild diastolic dysfunction with no RWMA. GR 1 DD.  Mild aortic sclerosis/no stenosis.  Questionable right atrial mass discounted with TEE)   . WRIST ARTHROPLASTY  2010   cancer lt wrist    Assessment & Plan Clinical Impression: Patient is a 79 y.o. 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), CAD s/p stent, chronic diastolic CHF- chronic BLE edema, peripheral neuropathy who was admitted on 02/08/20 after sustaining a fall while getting out of the shower. She had onset of left hip pain as well as scalp contusion but No LOC or CP. She was found  to have acute left subcapital femoral neck fracture with displacement and angulation. CT head with subacute/remote cortical infarct right frontal lobe with advanced atlantodental and cervical DDD. MRI brain done and revealed thin acute right frontal lobe SDH with moderate stable small vessel disease and suspected 5 mm incidental meningioma. MRA brain ordered due to concerns of stroke and showed moderate to severe stenosis bilateral cavernous ICA, moderate focal stenosis proximal M2 L-MCA and mild/moderate stenosis L-PCA at P3/P4 junction.   [name] consulted for input and questioned if SDH present as patient without neurological symptoms and no follow up recommended. She underwent left hip bipolar arthroplasty by [name] on 11/14. Post op to be WBAT and on ASA/Plavix for stroke and DVT prophylaxis. Post op with ABLA treated with one unit PRBC as well as acute hypoxic respiratory failure requiring venturi mask briefly, DOE, anxiety issues--on valium prn as well as urinary retention with I/O caths at [phone] cc. She was started  on urecholine and foley placed 11/18. Hypoxia felt to be due to fluid overload and treated with IV lasix urine. Respiratory status improved being weaned off oxygen. Therapy ongoing and patient limited by dizziness, fatigue as well as inability to recall hip precautions. Rehab CM also reporting that patient had transient episodes audible hallucinations on her past two visits. CIR recommended due to functional decline.   Patient transferred to CIR on 02/15/2020 .   Patient currently requires max with basic self-care skills secondary to muscle weakness, decreased cardiorespiratoy endurance, decreased coordination and decreased motor planning, decreased awareness, decreased problem solving, decreased safety awareness, decreased memory and delayed processing and decreased standing balance, decreased postural control, decreased balance strategies and difficulty maintaining precautions.  Prior to hospitalization, patient could complete BADLs with modified independent .  Patient will benefit from skilled intervention to increase independence with basic self-care skills prior to discharge home with care partner.  Anticipate patient will require 24 hour supervision and follow up home health.  OT - End of Session Activity Tolerance: Decreased this session Endurance Deficit: Yes OT Assessment Rehab Potential (ACUTE ONLY): Good OT Patient demonstrates impairments in the following area(s): Balance;Safety;Behavior;Sensory;Cognition;Skin Integrity;Edema;Endurance;Motor;Nutrition;Pain;Perception OT Basic ADL's Functional Problem(s): Grooming;Eating;Bathing;Dressing;Toileting OT Transfers Functional Problem(s): Toilet;Tub/Shower OT Additional Impairment(s): None OT Plan OT Intensity: Minimum of 1-2 x/day, 45 to 90 minutes OT Frequency: 5 out of 7 days OT Duration/Estimated Length of Stay: 16-21 days OT Treatment/Interventions: Balance/vestibular training;Neuromuscular re-education;Disease  mangement/prevention;Self Care/advanced ADL retraining;Therapeutic Exercise;Wheelchair propulsion/positioning;Cognitive remediation/compensation;DME/adaptive equipment instruction;Skin care/wound managment;Pain management;UE/LE Strength taining/ROM;Community reintegration;Functional electrical stimulation;Splinting/orthotics;Patient/family education;UE/LE Coordination activities;Visual/perceptual remediation/compensation;Therapeutic Activities;Functional mobility training;Psychosocial support;Discharge planning OT Self Feeding Anticipated Outcome(s): Supervision OT Basic Self-Care Anticipated Outcome(s): Supervision OT Toileting Anticipated Outcome(s): Supervision OT Bathroom Transfers Anticipated Outcome(s): Supervision OT Recommendation Patient destination: Home Follow Up Recommendations: Home health OT Equipment Recommended: To be determined   OT Evaluation Precautions/Restrictions  Precautions Precautions: Posterior Hip;Fall Restrictions Weight Bearing Restrictions: Yes LLE Weight Bearing: Weight bearing as tolerated General   Vital Signs Therapy Vitals Temp: 97.7 F (36.5 C) Temp Source: Oral Pulse Rate: 96 Resp: 18 BP: (!) 91/52 Patient Position (if appropriate): Lying Oxygen Therapy SpO2: 96 % O2 Device: Room Air Pain Pain Assessment Pain Score: 0-No pain Home Living/Prior Functioning Home Living Family/patient expects to be discharged to:: Private residence Living Arrangements: Spouse/significant other Available Help at Discharge: Available 24 hours/day Type of Home: House Home Access: Stairs to enter [employer] of Steps: 4 Entrance Stairs-Rails: Right, Left Home Layout: One level Bathroom Accessibility: Yes  Lives With: Spouse Prior Function  Level of Independence: Requires assistive device for independence, Independent with transfers, Independent with gait, Independent with homemaking with ambulation, Independent with basic ADLs  Able to Take  Stairs?: Yes Driving: No Vision Baseline Vision/History: No visual deficits Patient Visual Report: No change from baseline Vision Assessment?: No apparent visual deficits Perception  Perception: Within Functional Limits Praxis Praxis: Intact Cognition Overall Cognitive Status: Impaired/Different from baseline Arousal/Alertness: Awake/alert Orientation Level: Person;Place;Situation Person: Oriented Place: Oriented Situation: Oriented Year: 2021 Month: November Day of Week: Correct Memory: Impaired Memory Impairment: Decreased short term memory Decreased Short Term Memory: Verbal basic;Functional basic Immediate Memory Recall: Sock;Blue;Bed Memory Recall Sock: Without Cue Memory Recall Blue: Without Cue Memory Recall Bed: Without Cue Attention: Sustained;Selective Focused Attention: Appears intact Focused Attention Impairment: Verbal basic;Functional basic Sustained Attention: Appears intact Sustained Attention Impairment: Verbal basic;Functional basic Selective Attention: Impaired Selective Attention Impairment: Verbal basic;Functional basic Awareness: Impaired Awareness Impairment: Emergent impairment Safety/Judgment: Impaired Comments: Decreased recall of posterior hip precautions Sensation Sensation Light Touch: Appears Intact Coordination Gross Motor Movements are Fluid and Coordinated: No Fine Motor Movements are Fluid and Coordinated: Yes Motor  Motor Motor: Abnormal postural alignment and control  Trunk/Postural Assessment  Cervical Assessment Cervical Assessment: Within Functional Limits Thoracic Assessment Thoracic Assessment: Exceptions to [facility] (kyphotic) Lumbar Assessment Lumbar Assessment: Exceptions to [facility] (posterior pelvice tilt)  Balance Balance Balance Assessed: Yes Static Sitting Balance Static Sitting - Balance Support: Feet supported;Bilateral upper extremity supported Static Sitting - Level of Assistance: 5: Stand by assistance Dynamic  Sitting Balance Dynamic Sitting - Balance Support: During functional activity;Feet supported Dynamic Sitting - Level of Assistance: 5: Stand by assistance Static Standing Balance Static Standing - Balance Support: Bilateral upper extremity supported Static Standing - Level of Assistance: 4: Min assist Extremity/Trunk Assessment RUE Assessment RUE Assessment: Within Functional Limits General Strength Comments: 4/5 LUE Assessment LUE Assessment: Within Functional Limits General Strength Comments: 4/5  Care Tool Care Tool Self Care Eating   Eating Assist Level: Set up assist    Oral Care         Bathing Bathing activity did not occur: Safety/medical concerns (decreased BP)            Upper Body Dressing(including orthotics) Upper body dressing/undressing activity did not occur (including orthotics): Refused (decreased BP and pt not feeling well)          Lower Body Dressing (excluding footwear) Lower body dressing activity did not occur: Refused        Putting on/Taking off footwear             Care Tool Toileting Toileting activity Toileting Activity did not occur (Clothing management and hygiene only): N/A (no void or bm)       Care Tool Bed Mobility Roll left and right activity   Roll left and right assist level: Supervision/Verbal cueing    Sit to lying activity   Sit to lying assist level: Maximal Assistance - Patient 25 - 49%    Lying to sitting edge of bed activity   Lying to sitting edge of bed assist level: Contact Guard/Touching assist     Care Tool Transfers Sit to stand transfer   Sit to stand assist level: Moderate Assistance - Patient 50 - 74%    Chair/bed transfer         Toilet transfer         Care Tool Cognition Expression of Ideas and Wants Expression of Ideas and Wants: Some difficulty - exhibits some difficulty with expressing needs and  ideas (e.g, some words or finishing thoughts) or speech is not clear   Understanding Verbal  and Non-Verbal Content Understanding Verbal and Non-Verbal Content: Usually understands - understands most conversations, but misses some part/intent of message. Requires cues at times to understand   Memory/Recall Ability *first 3 days only Memory/Recall Ability *first 3 days only: Current season;That he or she is in a hospital/hospital unit    Refer to Care Plan for [place] 1 OT Short Term Goal 1 (Week 1): Pt will complete toilet transfer with mod assist OT Short Term Goal 2 (Week 1): Pt will complete LB dressing with mod assist OT Short Term Goal 3 (Week 1): Pt will increase activity tolerance and strength by completing grooming task in standing for 1 minute with min assist OT Short Term Goal 4 (Week 1): Pt will complete bathing with mod assist  Recommendations for other services: None    Skilled Therapeutic Intervention OT evaluation completed with husband present. Educated on role of OT, therapy schedule, safety plan, fall risk, cognitive impairments, posterior hip precautions (pt unable to recall), and ELOS. Pt received in bed verbalizing nausea and not feeling well. With time, pt agreeable to sit EOB, completing with CGA. Pt verbalized immediate dizziness with BP 89/57, SpO2 98% (room air) and HR 101. Pt completed deep breathing techniques remaining sitting EOB ~2 min with difficulty keeping eyes opened. Completed sit<>stand with mod A for repositioning then sit>supine with max A. BP 84/51, SpO2 97-99% on room air and HR 110. RN notified and arrived at room. Pt left supine in bed with RN present assisting with voiding due to decreased safety with transitioning to toilet and bladder scan indicating 436 mL.   ADL   Mobility  Bed Mobility Bed Mobility: Sit to Supine;Supine to Sit;Rolling Left;Rolling Right Rolling Right: Supervision/verbal cueing Rolling Left: Supervision/Verbal cueing Supine to Sit: Contact Guard/Touching assist Sitting - Scoot to Edge of  Bed: Moderate Assistance - Patient 50-74% Sit to Supine: Maximal Assistance - Patient 25-49% Scooting to [facility]: Total Assistance - Patient < 25% Transfers Sit to Stand: Moderate Assistance - Patient 50-74%   Discharge Criteria: Patient will be discharged from OT if patient refuses treatment 3 consecutive times without medical reason, if treatment goals not met, if there is a change in medical status, if patient makes no progress towards goals or if patient is discharged from hospital.  The above assessment, treatment plan, treatment alternatives and goals were discussed and mutually agreed upon: by patient and by family  Duayne Cal 02/16/2020, 2:44 PM

## 2020-02-16 NOTE — Evaluation (Signed)
Physical Therapy Assessment and Plan  Patient Details  Name: [name] MRN: [id number] Date of Birth: March 08, 1941  PT Diagnosis: Abnormal posture, Abnormality of gait, Difficulty walking, Dizziness and giddiness, Impaired cognition, Muscle weakness and Pain in Lt surgical site Rehab Potential: Good ELOS: 3 weeks   Today's Date: 02/16/2020 PT Individual Time: 0705-0815 PT Individual Time Calculation (min): 70 min    Hospital Problem: Principal Problem:   Subdural hemorrhage ([place])   Past Medical History:  Past Medical History:  Diagnosis Date  . Anemia    have received iron infusions  . Anxiety   . Arthritis     osteoarthritis  . Breast cancer ([place])   . Breast cancer in female [facility])    Bilateral  . CAD S/P percutaneous coronary angioplasty 2007; March 2011   [facility] 3.0 mm 20 mm postdilated 3.6 mm in early mid LAD; status post ISR Cutting Balloon PTCA and March 11 along with PCI of distal mid lesion with a 3.0 mm 12 mm MultiLink vision BMS; the proximal stent causes jailing of SP1 and SP2 with ostial 70-80% lesions  . Cancer (HCC)    Melanoma, Squamous cell Carcinoma  . Charcot's joint of foot, right   . Charcot's joint of left foot   . Chronic diastolic CHF (congestive heart failure), NYHA class 2 ([place]) 02/28/2018  . Chronic kidney disease    stage 2   . Coronary artery disease   . Dementia ([place])    mild  . Diabetes mellitus type 2 with neurological manifestations ([place])   . Diabetes mellitus without complication ([place])   . Dyslipidemia, goal LDL below 70   . Foot pain, bilateral   . Full dentures   . Genetic testing 12/03/2016   Germline genetic testing was performed through [employer] Common Hereditary Cancers Panel + [employer]'s Melanoma Panel. This custom panel includes analysis of the following 51 genes: APC, ATM, AXIN2, BAP1, BARD1, BMPR1A, BRCA1, BRCA2, BRIP1, CDH1, CDK4, CDKN2A, CHEK2, CTNNA1, DICER1, EPCAM, GREM1, HOXB13, KIT, MEN1, MITF, MLH1, MSH2, MSH3,  MSH6, MUTYH, NBN, NF1, NTHL1, PALB2, PDGFRA, PMS2, POLD1, POL  . GERD (gastroesophageal reflux disease)   . Gout   . Headache(784.0)   . History of hematuria    Followed by Dr. Gaynelle Arabian  . Hypertension, essential, benign   . Migraine   . Mild cognitive impairment   . Peripheral neuropathy   . Personal history of radiation therapy   . Post herpetic neuralgia   . Restless leg syndrome   . Stroke ([place])   . TIA (transient ischemic attack)    multiple in the past  . TIA (transient ischemic attack)   . Wears glasses    Past Surgical History:  Past Surgical History:  Procedure Laterality Date  . ABDOMINAL HYSTERECTOMY    . ANKLE FUSION Right 02/22/2018   Procedure: RIGHT SUBTALAR AND TALONAVICULAR FUSION;  Surgeon: Newt Minion, MD;  Location: [place];  Service: Orthopedics;  Laterality: Right;  . APPENDECTOMY    . BLADDER SUSPENSION    . BREAST LUMPECTOMY Bilateral 2018  . BREAST LUMPECTOMY WITH RADIOACTIVE SEED AND SENTINEL LYMPH NODE BIOPSY Bilateral 12/14/2016   Procedure: BILATERAL BREAST LUMPECTOMY WITH BILATERAL  RADIOACTIVE SEED AND BILATERAL AXILLARY  SENTINEL LYMPH NODE BIOPSY ERAS PATHWAY;  Surgeon: Alphonsa Overall, MD;  Location: [place];  Service: General;  Laterality: Bilateral;  PECTORAL BLOCK  . CARDIAC CATHETERIZATION  02/24/2006   85% stenosis in the proximal portion of LAD-arrangements made for PCI on 02/25/2006  .  CARDIAC CATHETERIZATION  02/25/2006   80% LAD lesion stented with a 3x74mm Liberte stent resulting in reduction of 80% lesion to 0% residual  . CARDIAC CATHETERIZATION  04/26/2006   Medical management  . CARDIAC CATHETERIZATION  06/24/2009   60-70% re-stenosis in the proximal LAD. A 3.25x15 cutting balloon, 3 inflations - 14atm-38sec, 13atm-39sec, and 12atm-40sec reduced to less than 10%. 60% stenosis of the mid/distal LAD stented with a 3x55mm Multilink stent.  Marland Kitchen CARDIAC CATHETERIZATION  07/09/2009   Medical management  . CARDIAC CATHETERIZATION    . CAROTID  DOPPLER  06/24/2009   40-59% R ICA stenosis. No significant ICA stenosis noted  . CHOLECYSTECTOMY    . CORONARY ANGIOPLASTY    . DILATION AND CURETTAGE OF UTERUS    . HIP ARTHROPLASTY Left 02/10/2020   Procedure: ARTHROPLASTY BIPOLAR HIP (HEMIARTHROPLASTY) POSTERIOR LATERAL;  Surgeon: [name], MD;  Location: [place];  Service: Orthopedics;  Laterality: Left;  . JOINT REPLACEMENT Left   . LESION REMOVAL Right 03/11/2015   Procedure:  EXCISION OF RIGHT PRE TIBIAL LESION;  Surgeon: Judeth Horn, MD;  Location: [place];  Service: General;  Laterality: Right;  . MASS EXCISION Left 06/10/2014   Procedure: EXCISION LEFT LOWER LEG LESION;  Surgeon: Doreen Salvage, MD;  Location: [place];  Service: General;  Laterality: Left;  Marland Kitchen MASS EXCISION Left 09/05/2015   Procedure: EXCISION OF LEFT FOREARM  MASS;  Surgeon: Judeth Horn, MD;  Location: [place];  Service: General;  Laterality: Left;  . NM MYOVIEW LTD  10/2011   No ischemia or infarction.  Normal EF.  LOW RISK. (Short bursts of 5 beats NSVT during recovery otherwise normal)  . SHOULDER ARTHROSCOPY  10/15  . SHOULDER ARTHROSCOPY  1995   left  . TEE WITHOUT CARDIOVERSION N/A 08/03/2017   Procedure: TRANSESOPHAGEAL ECHOCARDIOGRAM (TEE);  Surgeon: Skeet Latch, MD;  Location: [place];  Service: Cardiovascular;; EF 60-65% with no wall motion normalities.  No atrial thrombus noted.  Lightly findings consistent with a lipomatous hypertrophy of the atrial septum.   . TENDON REPAIR Left 05/12/2016   Procedure: Left Anterior Tibial Tendon Reconstruction;  Surgeon: Newt Minion, MD;  Location: [place];  Service: Orthopedics;  Laterality: Left;  . TOTAL KNEE ARTHROPLASTY  2005   left  . TRANSESOPHAGEAL ECHOCARDIOGRAM  08/03/2016   : EF 60-65% no wall motion normalities.  No atrial thrombus.  Lipomatous hypertrophy of the atrial septum.  No mass noted.  . TRANSESOPHAGEAL ECHOCARDIOGRAM  07/2017   EF  60 to 65%.  No R WMA.  No atrial thrombus noted.  Lipomatous IAS.  Marland Kitchen TRANSTHORACIC ECHOCARDIOGRAM  02/25/2017   :EF 65-70%.  GR 1 DD.  No or WMA.  Moderate aortic calcification/sclerosis but no stenosis.  . TRANSTHORACIC ECHOCARDIOGRAM  07/2017   Vigorous LV function.  EF 65-70%.  Mild diastolic dysfunction with no RWMA. GR 1 DD.  Mild aortic sclerosis/no stenosis.  Questionable right atrial mass discounted with TEE)   . WRIST ARTHROPLASTY  2010   cancer lt wrist    Assessment & Plan Clinical Impression: Patient is a 79 y.o. 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), CAD s/p stent, chronic diastolic CHF- chronic BLE edema, peripheral neuropathy who was admitted on 02/08/20 after sustaining a fall while getting out of the shower. She had onset of left hip pain as well as scalp contusion but  No LOC or CP. She was found to have acute left subcapital femoral neck fracture with displacement and angulation. CT head with subacute/remote cortical infarct right frontal lobe with advanced atlantodental and cervical DDD.  MRI brain done and revealed thin acute right frontal lobe SDH with moderate stable small vessel disease and suspected 5 mm incidental meningioma. MRA brain ordered due to concerns of stroke and showed moderate to severe stenosis bilateral cavernous ICA, moderate focal stenosis proximal M2 L-MCA and mild/moderate stenosis L-PCA at P3/P4 junction.   [name] consulted for input and questioned if SDH present as patient without neurological symptoms and no follow up recommended. She underwent left hip bipolar arthroplasty by [name] on 11/14. Post op to be WBAT and on ASA/Plavix for stroke and DVT prophylaxis.  Post op with ABLA treated with one unit PRBC as well as acute hypoxic respiratory failure requiring venturi mask briefly, DOE, anxiety issues--on valium prn as well as urinary retention with I/O  caths at [phone] cc. She was started on urecholine and foley placed 11/18.  Hypoxia felt to be due to fluid overload and treated with IV lasix urine. Respiratory status improved being weaned off oxygen. Therapy ongoing and patient limited by dizziness, fatigue as well as inability to recall hip precautions. Rehab CM also reporting that patient had transient episodes audible hallucinations on her past two visits. CIR recommended due to functional decline.   Patient transferred to CIR on 02/15/2020 .   Patient currently requires mod with mobility secondary to muscle weakness, muscle joint tightness and muscle paralysis, decreased cardiorespiratoy endurance and decreased oxygen support and decreased sitting balance, decreased standing balance, decreased postural control, decreased balance strategies and difficulty maintaining precautions.  Prior to hospitalization, patient was modified independent  with mobility and lived with Spouse in a House home.  Home access is 4Stairs to enter.  Patient will benefit from skilled PT intervention to maximize safe functional mobility, minimize fall risk and decrease caregiver burden for planned discharge home with 24 hour assist.  Anticipate patient will benefit from follow up [facility] at discharge.  PT - End of Session Activity Tolerance: Tolerates 30+ min activity with multiple rests Endurance Deficit: Yes PT Assessment Rehab Potential (ACUTE/IP ONLY): Good PT Barriers to Discharge: Inaccessible home environment;Decreased caregiver support;Wound Care;Weight bearing restrictions;Medication compliance;Incontinence PT Patient demonstrates impairments in the following area(s): Balance;Endurance;Pain;Safety;Sensory;Skin Integrity PT Transfers Functional Problem(s): Bed Mobility;Bed to Chair;Car;Furniture PT Locomotion Functional Problem(s): Ambulation;Wheelchair Mobility;Stairs PT Plan PT Intensity: Minimum of 1-2 x/day ,45 to 90 minutes PT Frequency: 5 out of 7 days PT  Duration Estimated Length of Stay: 3 weeks PT Treatment/Interventions: Ambulation/gait training;Balance/vestibular training;Cognitive remediation/compensation;Community reintegration;DME/adaptive equipment instruction;Disease management/prevention;Discharge planning;Functional mobility training;Psychosocial support;Therapeutic Activities;Visual/perceptual remediation/compensation;Wheelchair propulsion/positioning;Therapeutic Exercise;Skin care/wound management;Neuromuscular re-education;Pain management;Splinting/orthotics;UE/LE Strength taining/ROM;UE/LE Coordination activities;Stair training;Patient/family education PT Transfers Anticipated Outcome(s): supervision PT Locomotion Anticipated Outcome(s): CGA PT Recommendation Follow Up Recommendations: Home health PT Patient destination: Home Equipment Recommended: To be determined   PT Evaluation Precautions/Restrictions Precautions Precautions: Fall;Posterior Hip Precaution Comments: reviewed hip precautions Other Brace: 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 Restrictions Weight Bearing Restrictions: Yes LLE Weight Bearing: Weight bearing as tolerated Other Position/Activity Restrictions: posterior hip precautions General   Vital Signs  Pain Pain Assessment Pain Scale: 0-10 Pain Score: 2  Pain Type: Surgical pain Pain Location: Leg Pain Orientation: Left Pain Descriptors / Indicators: Aching;Discomfort Home Living/Prior Functioning Home Living Available Help at Discharge: Family;Available 24 hours/day;Home health Type of Home: House Home Access: Stairs to enter [employer] of Steps: 4  Entrance Stairs-Rails: Left;Right Home Layout: One level  Lives With: Spouse Prior Function Level of Independence: Requires assistive device for independence;Independent with transfers;Independent with gait;Independent with homemaking with ambulation;Independent with basic ADLs  Able to Take Stairs?: Yes Driving: No Vision/Perception   Perception Perception: Within Functional Limits Praxis Praxis: Intact  Cognition Overall Cognitive Status: Within Functional Limits for tasks assessed Arousal/Alertness: Awake/alert Orientation Level: Oriented X4 Attention: Focused;Sustained Focused Attention: Impaired Focused Attention Impairment: Verbal basic;Functional basic Sustained Attention: Impaired Sustained Attention Impairment: Functional basic;Verbal basic Memory: Impaired Safety/Judgment: Impaired Comments: difficulty wiht hip precautions Sensation Sensation Light Touch: Appears Intact Coordination Gross Motor Movements are Fluid and Coordinated: No Fine Motor Movements are Fluid and Coordinated: Yes Motor  Motor Motor: Within Functional Limits   Trunk/Postural Assessment  Cervical Assessment Cervical Assessment: Exceptions to [facility] (forward head carriage) Thoracic Assessment Thoracic Assessment: Exceptions to [facility] (kyphotic) Lumbar Assessment Lumbar Assessment: Exceptions to [facility] (posterior pelvic tilt) Postural Control Postural Control: Deficits on evaluation  Balance Balance Balance Assessed: Yes Static Sitting Balance Static Sitting - Balance Support: Feet supported;Bilateral upper extremity supported Static Sitting - Level of Assistance: 5: Stand by assistance Dynamic Sitting Balance Dynamic Sitting - Balance Support: Feet supported;During functional activity Dynamic Sitting - Level of Assistance: 4: Min assist Dynamic Sitting - Balance Activities: Forward lean/weight shifting;Reaching for objects;Lateral lean/weight shifting Sitting balance - Comments: less than 5 mins each time Static Standing Balance Static Standing - Balance Support: Bilateral upper extremity supported Static Standing - Level of Assistance: 4: Min assist Dynamic Standing Balance Dynamic Standing - Balance Support: Bilateral upper extremity supported Dynamic Standing - Level of Assistance: 3: Mod assist Dynamic Standing - Balance  Activities: Reaching across midline;Reaching for objects;Lateral lean/weight shifting Extremity Assessment      RLE Assessment RLE Assessment: Exceptions to [facility] RLE Strength Right Hip Flexion: 4/5 Right Hip ABduction: 4/5 Right Hip ADduction: 4/5 Right Knee Flexion: 4/5 Right Knee Extension: 4/5 Right Ankle Dorsiflexion: 4+/5 Right Ankle Plantar Flexion: 4+/5 LLE Strength Left Hip Flexion: 3/5 (limited 2/2 hip precautions and pain) Left Hip ABduction: 3+/5 Left Hip ADduction: 3+/5 Left Knee Flexion: 3+/5 Left Knee Extension: 3+/5 Left Ankle Dorsiflexion: 4-/5 Left Ankle Plantar Flexion: 4-/5  Care Tool Care Tool Bed Mobility Roll left and right activity   Roll left and right assist level: Minimal Assistance - Patient > 75%    Sit to lying activity   Sit to lying assist level: Moderate Assistance - Patient 50 - 74%    Lying to sitting edge of bed activity   Lying to sitting edge of bed assist level: Minimal Assistance - Patient > 75%     Care Tool Transfers Sit to stand transfer   Sit to stand assist level: Maximal Assistance - Patient 25 - 49%    Chair/bed transfer   Chair/bed transfer assist level: Moderate Assistance - Patient 50 - 74%     Toilet transfer   Assist Level: Moderate Assistance - Patient 50 - 74%    Car transfer Car transfer activity did not occur: Safety/medical concerns        Care Tool Locomotion Ambulation   Assist level: Moderate Assistance - Patient 50 - 74% Assistive device: Walker-rolling Max distance: 15  Walk 10 feet activity   Assist level: Moderate Assistance - Patient - 50 - 74% Assistive device: Walker-rolling   Walk 50 feet with 2 turns activity Walk 50 feet with 2 turns activity did not occur: Safety/medical concerns      Walk 150 feet activity  Walk 150 feet activity did not occur: Safety/medical concerns      Walk 10 feet on uneven surfaces activity Walk 10 feet on uneven surfaces activity did not occur: Safety/medical  concerns      Stairs Stair activity did not occur: Safety/medical concerns        Walk up/down 1 step activity Walk up/down 1 step or curb (drop down) activity did not occur: Safety/medical concerns     Walk up/down 4 steps activity did not occuR: Safety/medical concerns  Walk up/down 4 steps activity      Walk up/down 12 steps activity Walk up/down 12 steps activity did not occur: Safety/medical concerns      Pick up small objects from floor Pick up small object from the floor (from standing position) activity did not occur: Safety/medical concerns      Wheelchair Will patient use wheelchair at discharge?: Yes Type of Wheelchair: Manual   Wheelchair assist level: Minimal Assistance - Patient > 75% Max wheelchair distance: 100'  Wheel 50 feet with 2 turns activity   Assist Level: Minimal Assistance - Patient > 75%  Wheel 150 feet activity   Assist Level: Maximal Assistance - Patient 25 - 49%    Refer to Care Plan for Long Term Goals  SHORT TERM GOAL WEEK 1 PT Short Term Goal 1 (Week 1): pt to demonstrate supine<>sit min A PT Short Term Goal 2 (Week 1): pt to demonstrate good recall of posterior hip precautions 3/3 PT Short Term Goal 3 (Week 1): pt to demonstrate functional transfer with LRAD at min A PT Short Term Goal 4 (Week 1): pt to demonstrate gait training with rolling walker at 50' min A  Recommendations for other services: Therapeutic Recreation  Stress management  Skilled Therapeutic Intervention   Evaluation completed (see details above and below) with education on PT POC and goals and individual treatment initiated with focus on  Bed mobility, transfer training, gait training, safety awareness, call light use. pt received in bed and agreeable to therapy. Pt very motivated to participate and improve functionally. Pt unable to recall hip precautions, PT educated her on 3/3 posterior hip precautions at start of session and reattempted midway through session, pt  unable to recall and reeducation on this. Pt directed in supine>sit min A for Left lower extremity assistance and slight trunk support. Pt directed in sitting EOB, max A for scooting EOB and min A for sitting balance improved to CGA. Pt directed in Sit to stand from bed to Rolling walker max A with slightly elevated bed height, once in standing at walker pt min A for standing balance and directed in stand pivot transfer to [facility] mod A for stability. Pt has history of bilateral charcot and has foot braces due to this but did not bring them to hospital. Pt limited slightly in balance due to this. Pt reported she needed to use the restroom, taken to toilet with riser in place, in [facility] total A for time, then directed in stand pivot with Rolling walker to commode, mod A but with good safety awareness. Pt sat at commode for 5 mins unable to urinate and agreeable to transfer to [facility], mod A with grab bar use to transfer to [place]. Pt taken out of bathroom in [facility] for safety, and directed in Sit to stand from [facility] in room mod A to Rolling walker and directed in gait training 15' total mod A with poor cadence noted, x2 standing static breaks 2/2 fatigue, noted decreased B step  height and decreased stride, VC to correct but limited effect. Pt required to sit at this distance and reported she felt exhausted. Pt directed in Stand pivot transfer to recliner at end of session with Rolling walker mod A. Throughout session pt required frequent and multiple rest breaks in sitting 2/2 fatigue and deconditioning, on 1L oxygen via nasal canula during did not wear at home. Pt left in WC, alarm set, All needs in reach and in good condition. Call light in hand.  And nursing updated of pt status and location and agreeable.   Mobility Bed Mobility Bed Mobility: Rolling Right;Supine to Sit;Rolling Left;Sit to Supine;Sitting - Scoot to Edge of Bed Rolling Right: Minimal Assistance - Patient > 75% Rolling Left: Minimal Assistance - Patient > 75% Supine  to Sit: Minimal Assistance - Patient > 75% Sitting - Scoot to Edge of Bed: Moderate Assistance - Patient 50-74% Sit to Supine: Moderate Assistance - Patient 50-74% Scooting to HOB: Moderate Assistance - Patient 50-74% Transfers Transfers: Sit to Stand;Stand Pivot Transfers Sit to Stand: Maximal Assistance - Patient 25-49% Stand Pivot Transfers: Moderate Assistance - Patient 50 - 74% Stand Pivot Transfer Details: Verbal cues for sequencing;Verbal cues for technique;Verbal cues for precautions/safety;Manual facilitation for weight shifting;Verbal cues for safe use of DME/AE Transfer (Assistive device): Rolling walker Locomotion  Gait Ambulation: Yes Gait Assistance: Moderate Assistance - Patient 50-74% Gait Distance (Feet): 15 Feet Assistive device: Rolling walker Gait Assistance Details: Verbal cues for sequencing;Verbal cues for precautions/safety;Verbal cues for safe use of DME/AE;Verbal cues for gait pattern;Verbal cues for technique Gait Assistance Details: mod A for midline, weight shifting, support for limb advancement Gait Gait: Yes Gait Pattern: Step-to pattern;Decreased step length - left;Decreased stride length;Trunk flexed Gait velocity: decreased Stairs / Additional Locomotion Stairs: No Wheelchair Mobility Wheelchair Mobility: Yes Wheelchair Assistance: Minimal assistance - Patient >75% Wheelchair Propulsion: Both upper extremities Wheelchair Parts Management: Supervision/cueing;Needs assistance Distance: 100'   Discharge Criteria: Patient will be discharged from PT if patient refuses treatment 3 consecutive times without medical reason, if treatment goals not met, if there is a change in medical status, if patient makes no progress towards goals or if patient is discharged from hospital.  The above assessment, treatment plan, treatment alternatives and goals were discussed and mutually agreed upon: by patient  [name] 02/16/2020, 10:37 AM

## 2020-02-16 NOTE — Progress Notes (Signed)
[place] PHYSICAL MEDICINE & REHABILITATION PROGRESS NOTE   Subjective/Complaints:  Nauseated this am, RN in room to d/c foley, pt requests toileting to have BM, reports LBM 3d ago  ROS- feels funny in right side chest chest but also epigastric discomfort and abd discomfort no SOB  Objective:   No results found. Recent Labs    02/15/20 0358 02/16/20 0111  WBC 12.8* 13.3*  HGB 8.9* 8.2*  HCT 28.3* 26.2*  PLT 243 261   Recent Labs    02/15/20 0358 02/16/20 0111  NA 140 137  K 3.4* 3.9  CL 106 100  CO2 27 29  GLUCOSE 180* 185*  BUN 47* 47*  CREATININE 1.35* 1.39*  CALCIUM 8.5* 8.6*    Intake/Output Summary (Last 24 hours) at 02/16/2020 0657 Last data filed at 02/16/2020 0513 Gross per 24 hour  Intake 357 ml  Output 1125 ml  Net -768 ml        Physical Exam: Vital Signs Blood pressure 115/67, pulse 98, temperature 98.9 F (37.2 C), temperature source Oral, resp. rate 18, height 5\' 6"  (1.676 m), weight 85.6 kg, SpO2 94 %.   General: No acute distress Mood and affect are appropriate Heart: Regular rate and rhythm no rubs murmurs or extra sounds Mild parssternal tenderness  Lungs: Clear to auscultation, breathing unlabored, no rales or wheezes Abdomen: Positive bowel sounds, soft nontender to palpation, nondistended Extremities: No clubbing, cyanosis, or edema Skin: No evidence of breakdown, no evidence of rash Neurologic: Cranial nerves II through XII intact, motor strength is 5/5 in bilateral deltoid, bicep, tricep, grip, RIght hip flexor, knee extensors, ankle dorsiflexor and plantar flexor, 4/5 on left side  Sensory exam normal sensation to light touch and proprioception in bilateral upper and lower extremities  Musculoskeletal: reduced L hip arom   Assessment/Plan: 1. Functional deficits which require 3+ hours per day of interdisciplinary therapy in a comprehensive inpatient rehab setting.  Physiatrist is providing close team supervision and 24 hour  management of active medical problems listed below.  Physiatrist and rehab team continue to assess barriers to discharge/monitor patient progress toward functional and medical goals  Care Tool:  Bathing              Bathing assist       Upper Body Dressing/Undressing Upper body dressing        Upper body assist      Lower Body Dressing/Undressing Lower body dressing            Lower body assist       Toileting Toileting    Toileting assist       Transfers Chair/bed transfer  Transfers assist           Locomotion Ambulation   Ambulation assist              Walk 10 feet activity   Assist           Walk 50 feet activity   Assist           Walk 150 feet activity   Assist           Walk 10 feet on uneven surface  activity   Assist           Wheelchair     Assist               Wheelchair 50 feet with 2 turns activity    Assist  Wheelchair 150 feet activity     Assist          Blood pressure 115/67, pulse 98, temperature 98.9 F (37.2 C), temperature source Oral, resp. rate 18, height 5\' 6"  (1.676 m), weight 85.6 kg, SpO2 94 %.  Medical Problem List and Plan: 1.  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  Antithrombotics: -DVT/anticoagulation:  Mechanical: Sequential compression devices, below knee Bilateral lower extremities             -antiplatelet therapy: Plavix 3. Neuropathy/charcot feet/Pain Management: Uses braces and gabapentin for pain.  4. Mood: LCSW to follow for evaluation and support.              -antipsychotic agents: N/A 5. Neuropsych: This patient is not fully capable of making decisions on her own behalf. 6. Skin/Wound Care: Monitor incision for healing. Continue supplements to promote wound healing.  7. Fluids/Electrolytes/Nutrition: Monitor I/O. Change glucerna to  with meals.  8. Left femur fracture: WBAT with posterior hip precautions.  9. 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 SCr better but BUN with rise--> d/c Naprosyn.  Will order orthostatic vitals and monitor Lytes with serial checks. . 10. CAD s/p PCI/stent: Monitor for symptoms with activity.  11. T2DM:  Hgb A1c-8.8.  Monitor BS ac/hs --resume Januvia and use SSI for elevated  BS. CBGs have been moderately elevated.  12. Chronic diastolic CHF: Monitor for signs of overload--order flutter valve for pulmonary hygiene. Her oxygen is being weaned off today--order prn use with activity. Will check weights daily. On metoprolol, Plavix, Crestor and Bumex resumed 11/17.  13. Urinary retention/Leucocytosis: Check UA/UCS--d/c foley in am and start bladder training. Discontinue urecholine-->on Valium (to relax bladder). UA was positive on 11/17 but culture showed no growth. Voiding trial today  14. Acute blood loss anemia: Monitor H/H for trends--13.3-->8.8--> 8.9 15. Peripheral edema: 2+ edema-> good day for legs 11/19 per family.  16. Insomnia: Trazodone 25--50mg  HS PRN  17.  Constipation, pt wants to drink her coffee and try toileting with therapy , if no result dulc supp this pm   LOS: 1 days A FACE TO FACE EVALUATION WAS PERFORMED  [name] 02/16/2020, 6:57 AM

## 2020-02-16 NOTE — Progress Notes (Signed)
SLP Cancellation Note  Patient Details Name: Daniela Siebers MRN: [id number] DOB: 06/23/1940   Cancelled treatment:         Patient not feeling well and had recent BP drop during earlier therapy session. She was unable to maintain adequate alertness; patient reported not feeling well, having pain and discomfort on chest,  and had recent BP drop during earlier therapy session. She was unable to maintain adequate alertness. She missed 60 minutes ST eval.  Sonia Baller, MA, CCC-SLP Speech Therapy

## 2020-02-17 ENCOUNTER — Inpatient Hospital Stay (HOSPITAL_COMMUNITY): Payer: Medicare Other | Admitting: Occupational Therapy

## 2020-02-17 ENCOUNTER — Inpatient Hospital Stay (HOSPITAL_COMMUNITY): Payer: Medicare Other | Admitting: Speech Pathology

## 2020-02-17 ENCOUNTER — Inpatient Hospital Stay (HOSPITAL_COMMUNITY): Payer: Medicare Other

## 2020-02-17 ENCOUNTER — Other Ambulatory Visit: Payer: Self-pay

## 2020-02-17 ENCOUNTER — Encounter (HOSPITAL_COMMUNITY): Payer: Self-pay | Admitting: Internal Medicine

## 2020-02-17 ENCOUNTER — Inpatient Hospital Stay: Payer: Self-pay

## 2020-02-17 ENCOUNTER — Inpatient Hospital Stay (HOSPITAL_COMMUNITY)
Admission: AD | Admit: 2020-02-17 | Discharge: 2020-03-02 | DRG: 368 | Disposition: A | Discharge status: Skilled Nursing Facility | Payer: Medicare Other | Source: Intra-hospital | Attending: Internal Medicine | Admitting: Internal Medicine

## 2020-02-17 ENCOUNTER — Inpatient Hospital Stay (HOSPITAL_COMMUNITY): Payer: Medicare Other | Admitting: Physical Therapy

## 2020-02-17 DIAGNOSIS — D62 Acute posthemorrhagic anemia: Secondary | ICD-10-CM | POA: Diagnosis not present

## 2020-02-17 DIAGNOSIS — K2101 Gastro-esophageal reflux disease with esophagitis, with bleeding: Secondary | ICD-10-CM | POA: Diagnosis not present

## 2020-02-17 DIAGNOSIS — K2980 Duodenitis without bleeding: Secondary | ICD-10-CM | POA: Diagnosis not present

## 2020-02-17 DIAGNOSIS — Z20822 Contact with and (suspected) exposure to covid-19: Secondary | ICD-10-CM | POA: Diagnosis not present

## 2020-02-17 DIAGNOSIS — E876 Hypokalemia: Secondary | ICD-10-CM | POA: Diagnosis not present

## 2020-02-17 DIAGNOSIS — I130 Hypertensive heart and chronic kidney disease with heart failure and stage 1 through stage 4 chronic kidney disease, or unspecified chronic kidney disease: Secondary | ICD-10-CM | POA: Diagnosis not present

## 2020-02-17 DIAGNOSIS — E1159 Type 2 diabetes mellitus with other circulatory complications: Secondary | ICD-10-CM | POA: Diagnosis not present

## 2020-02-17 DIAGNOSIS — K254 Chronic or unspecified gastric ulcer with hemorrhage: Secondary | ICD-10-CM | POA: Diagnosis present

## 2020-02-17 DIAGNOSIS — C50211 Malignant neoplasm of upper-inner quadrant of right female breast: Secondary | ICD-10-CM | POA: Diagnosis not present

## 2020-02-17 DIAGNOSIS — Z955 Presence of coronary angioplasty implant and graft: Secondary | ICD-10-CM | POA: Diagnosis not present

## 2020-02-17 DIAGNOSIS — R339 Retention of urine, unspecified: Secondary | ICD-10-CM | POA: Diagnosis not present

## 2020-02-17 DIAGNOSIS — D500 Iron deficiency anemia secondary to blood loss (chronic): Secondary | ICD-10-CM | POA: Diagnosis not present

## 2020-02-17 DIAGNOSIS — Z90710 Acquired absence of both cervix and uterus: Secondary | ICD-10-CM

## 2020-02-17 DIAGNOSIS — I700 Atherosclerosis of aorta: Secondary | ICD-10-CM | POA: Diagnosis not present

## 2020-02-17 DIAGNOSIS — Z8249 Family history of ischemic heart disease and other diseases of the circulatory system: Secondary | ICD-10-CM

## 2020-02-17 DIAGNOSIS — E1149 Type 2 diabetes mellitus with other diabetic neurological complication: Secondary | ICD-10-CM | POA: Diagnosis present

## 2020-02-17 DIAGNOSIS — I2699 Other pulmonary embolism without acute cor pulmonale: Secondary | ICD-10-CM

## 2020-02-17 DIAGNOSIS — N1832 Chronic kidney disease, stage 3b: Secondary | ICD-10-CM

## 2020-02-17 DIAGNOSIS — E785 Hyperlipidemia, unspecified: Secondary | ICD-10-CM | POA: Diagnosis present

## 2020-02-17 DIAGNOSIS — M79609 Pain in unspecified limb: Secondary | ICD-10-CM | POA: Diagnosis not present

## 2020-02-17 DIAGNOSIS — G4700 Insomnia, unspecified: Secondary | ICD-10-CM | POA: Diagnosis present

## 2020-02-17 DIAGNOSIS — D72829 Elevated white blood cell count, unspecified: Secondary | ICD-10-CM | POA: Diagnosis present

## 2020-02-17 DIAGNOSIS — R578 Other shock: Secondary | ICD-10-CM | POA: Diagnosis not present

## 2020-02-17 DIAGNOSIS — E118 Type 2 diabetes mellitus with unspecified complications: Secondary | ICD-10-CM | POA: Diagnosis not present

## 2020-02-17 DIAGNOSIS — IMO0002 Reserved for concepts with insufficient information to code with codable children: Secondary | ICD-10-CM | POA: Diagnosis present

## 2020-02-17 DIAGNOSIS — Z853 Personal history of malignant neoplasm of breast: Secondary | ICD-10-CM

## 2020-02-17 DIAGNOSIS — K219 Gastro-esophageal reflux disease without esophagitis: Secondary | ICD-10-CM | POA: Diagnosis present

## 2020-02-17 DIAGNOSIS — Z8781 Personal history of (healed) traumatic fracture: Secondary | ICD-10-CM

## 2020-02-17 DIAGNOSIS — K2091 Esophagitis, unspecified with bleeding: Secondary | ICD-10-CM | POA: Diagnosis not present

## 2020-02-17 DIAGNOSIS — Z452 Encounter for adjustment and management of vascular access device: Secondary | ICD-10-CM

## 2020-02-17 DIAGNOSIS — Z803 Family history of malignant neoplasm of breast: Secondary | ICD-10-CM

## 2020-02-17 DIAGNOSIS — S72012D Unspecified intracapsular fracture of left femur, subsequent encounter for closed fracture with routine healing: Secondary | ICD-10-CM

## 2020-02-17 DIAGNOSIS — R062 Wheezing: Principal | ICD-10-CM

## 2020-02-17 DIAGNOSIS — K279 Peptic ulcer, site unspecified, unspecified as acute or chronic, without hemorrhage or perforation: Secondary | ICD-10-CM | POA: Diagnosis not present

## 2020-02-17 DIAGNOSIS — N184 Chronic kidney disease, stage 4 (severe): Secondary | ICD-10-CM | POA: Diagnosis present

## 2020-02-17 DIAGNOSIS — K5900 Constipation, unspecified: Secondary | ICD-10-CM | POA: Diagnosis present

## 2020-02-17 DIAGNOSIS — E11319 Type 2 diabetes mellitus with unspecified diabetic retinopathy without macular edema: Secondary | ICD-10-CM | POA: Diagnosis present

## 2020-02-17 DIAGNOSIS — R579 Shock, unspecified: Secondary | ICD-10-CM | POA: Diagnosis not present

## 2020-02-17 DIAGNOSIS — Z96652 Presence of left artificial knee joint: Secondary | ICD-10-CM | POA: Diagnosis present

## 2020-02-17 DIAGNOSIS — Z79899 Other long term (current) drug therapy: Secondary | ICD-10-CM

## 2020-02-17 DIAGNOSIS — I890 Lymphedema, not elsewhere classified: Secondary | ICD-10-CM | POA: Diagnosis present

## 2020-02-17 DIAGNOSIS — Z981 Arthrodesis status: Secondary | ICD-10-CM

## 2020-02-17 DIAGNOSIS — I2510 Atherosclerotic heart disease of native coronary artery without angina pectoris: Secondary | ICD-10-CM | POA: Diagnosis present

## 2020-02-17 DIAGNOSIS — K317 Polyp of stomach and duodenum: Secondary | ICD-10-CM | POA: Diagnosis not present

## 2020-02-17 DIAGNOSIS — Z8049 Family history of malignant neoplasm of other genital organs: Secondary | ICD-10-CM

## 2020-02-17 DIAGNOSIS — Z96642 Presence of left artificial hip joint: Secondary | ICD-10-CM | POA: Diagnosis present

## 2020-02-17 DIAGNOSIS — N179 Acute kidney failure, unspecified: Secondary | ICD-10-CM | POA: Diagnosis present

## 2020-02-17 DIAGNOSIS — K2100 Gastro-esophageal reflux disease with esophagitis, without bleeding: Secondary | ICD-10-CM | POA: Diagnosis not present

## 2020-02-17 DIAGNOSIS — E559 Vitamin D deficiency, unspecified: Secondary | ICD-10-CM | POA: Diagnosis not present

## 2020-02-17 DIAGNOSIS — D649 Anemia, unspecified: Secondary | ICD-10-CM | POA: Diagnosis not present

## 2020-02-17 DIAGNOSIS — F419 Anxiety disorder, unspecified: Secondary | ICD-10-CM | POA: Diagnosis present

## 2020-02-17 DIAGNOSIS — Z85820 Personal history of malignant melanoma of skin: Secondary | ICD-10-CM

## 2020-02-17 DIAGNOSIS — K922 Gastrointestinal hemorrhage, unspecified: Secondary | ICD-10-CM | POA: Diagnosis not present

## 2020-02-17 DIAGNOSIS — K449 Diaphragmatic hernia without obstruction or gangrene: Secondary | ICD-10-CM | POA: Diagnosis not present

## 2020-02-17 DIAGNOSIS — Z9049 Acquired absence of other specified parts of digestive tract: Secondary | ICD-10-CM

## 2020-02-17 DIAGNOSIS — Z7901 Long term (current) use of anticoagulants: Secondary | ICD-10-CM

## 2020-02-17 DIAGNOSIS — S065X9S Traumatic subdural hemorrhage with loss of consciousness of unspecified duration, sequela: Secondary | ICD-10-CM

## 2020-02-17 DIAGNOSIS — K3189 Other diseases of stomach and duodenum: Secondary | ICD-10-CM | POA: Diagnosis not present

## 2020-02-17 DIAGNOSIS — I959 Hypotension, unspecified: Secondary | ICD-10-CM | POA: Diagnosis not present

## 2020-02-17 DIAGNOSIS — K264 Chronic or unspecified duodenal ulcer with hemorrhage: Secondary | ICD-10-CM | POA: Diagnosis present

## 2020-02-17 DIAGNOSIS — Z825 Family history of asthma and other chronic lower respiratory diseases: Secondary | ICD-10-CM

## 2020-02-17 DIAGNOSIS — K319 Disease of stomach and duodenum, unspecified: Secondary | ICD-10-CM | POA: Diagnosis not present

## 2020-02-17 DIAGNOSIS — K921 Melena: Secondary | ICD-10-CM | POA: Diagnosis not present

## 2020-02-17 DIAGNOSIS — Z7401 Bed confinement status: Secondary | ICD-10-CM | POA: Diagnosis not present

## 2020-02-17 DIAGNOSIS — E1165 Type 2 diabetes mellitus with hyperglycemia: Secondary | ICD-10-CM | POA: Diagnosis not present

## 2020-02-17 DIAGNOSIS — I5032 Chronic diastolic (congestive) heart failure: Secondary | ICD-10-CM | POA: Diagnosis not present

## 2020-02-17 DIAGNOSIS — K222 Esophageal obstruction: Secondary | ICD-10-CM | POA: Diagnosis present

## 2020-02-17 DIAGNOSIS — M6281 Muscle weakness (generalized): Secondary | ICD-10-CM | POA: Diagnosis not present

## 2020-02-17 DIAGNOSIS — B3781 Candidal esophagitis: Secondary | ICD-10-CM | POA: Diagnosis present

## 2020-02-17 DIAGNOSIS — J9601 Acute respiratory failure with hypoxia: Secondary | ICD-10-CM | POA: Diagnosis present

## 2020-02-17 DIAGNOSIS — Z888 Allergy status to other drugs, medicaments and biological substances status: Secondary | ICD-10-CM

## 2020-02-17 DIAGNOSIS — R911 Solitary pulmonary nodule: Secondary | ICD-10-CM | POA: Diagnosis present

## 2020-02-17 DIAGNOSIS — K229 Disease of esophagus, unspecified: Secondary | ICD-10-CM | POA: Diagnosis not present

## 2020-02-17 DIAGNOSIS — C50412 Malignant neoplasm of upper-outer quadrant of left female breast: Secondary | ICD-10-CM | POA: Diagnosis not present

## 2020-02-17 DIAGNOSIS — M2550 Pain in unspecified joint: Secondary | ICD-10-CM | POA: Diagnosis not present

## 2020-02-17 DIAGNOSIS — N189 Chronic kidney disease, unspecified: Secondary | ICD-10-CM | POA: Diagnosis not present

## 2020-02-17 DIAGNOSIS — Z823 Family history of stroke: Secondary | ICD-10-CM

## 2020-02-17 DIAGNOSIS — Z8673 Personal history of transient ischemic attack (TIA), and cerebral infarction without residual deficits: Secondary | ICD-10-CM | POA: Diagnosis not present

## 2020-02-17 DIAGNOSIS — G2581 Restless legs syndrome: Secondary | ICD-10-CM | POA: Diagnosis present

## 2020-02-17 DIAGNOSIS — W182XXA Fall in (into) shower or empty bathtub, initial encounter: Secondary | ICD-10-CM | POA: Diagnosis present

## 2020-02-17 DIAGNOSIS — N390 Urinary tract infection, site not specified: Secondary | ICD-10-CM | POA: Diagnosis not present

## 2020-02-17 DIAGNOSIS — R710 Precipitous drop in hematocrit: Secondary | ICD-10-CM | POA: Diagnosis not present

## 2020-02-17 DIAGNOSIS — Z885 Allergy status to narcotic agent status: Secondary | ICD-10-CM

## 2020-02-17 DIAGNOSIS — R0603 Acute respiratory distress: Secondary | ICD-10-CM

## 2020-02-17 DIAGNOSIS — Z8261 Family history of arthritis: Secondary | ICD-10-CM

## 2020-02-17 DIAGNOSIS — E1142 Type 2 diabetes mellitus with diabetic polyneuropathy: Secondary | ICD-10-CM | POA: Diagnosis present

## 2020-02-17 DIAGNOSIS — I517 Cardiomegaly: Secondary | ICD-10-CM | POA: Diagnosis not present

## 2020-02-17 DIAGNOSIS — I6200 Nontraumatic subdural hemorrhage, unspecified: Secondary | ICD-10-CM | POA: Diagnosis not present

## 2020-02-17 DIAGNOSIS — Z841 Family history of disorders of kidney and ureter: Secondary | ICD-10-CM

## 2020-02-17 DIAGNOSIS — I10 Essential (primary) hypertension: Secondary | ICD-10-CM | POA: Diagnosis not present

## 2020-02-17 DIAGNOSIS — M5030 Other cervical disc degeneration, unspecified cervical region: Secondary | ICD-10-CM | POA: Diagnosis present

## 2020-02-17 DIAGNOSIS — K209 Esophagitis, unspecified without bleeding: Secondary | ICD-10-CM | POA: Diagnosis not present

## 2020-02-17 DIAGNOSIS — Z800 Family history of malignant neoplasm of digestive organs: Secondary | ICD-10-CM

## 2020-02-17 DIAGNOSIS — K259 Gastric ulcer, unspecified as acute or chronic, without hemorrhage or perforation: Secondary | ICD-10-CM | POA: Diagnosis not present

## 2020-02-17 DIAGNOSIS — E11610 Type 2 diabetes mellitus with diabetic neuropathic arthropathy: Secondary | ICD-10-CM | POA: Diagnosis present

## 2020-02-17 DIAGNOSIS — Z833 Family history of diabetes mellitus: Secondary | ICD-10-CM

## 2020-02-17 DIAGNOSIS — K274 Chronic or unspecified peptic ulcer, site unspecified, with hemorrhage: Secondary | ICD-10-CM | POA: Diagnosis not present

## 2020-02-17 DIAGNOSIS — R58 Hemorrhage, not elsewhere classified: Secondary | ICD-10-CM | POA: Diagnosis not present

## 2020-02-17 DIAGNOSIS — J9811 Atelectasis: Secondary | ICD-10-CM | POA: Diagnosis present

## 2020-02-17 DIAGNOSIS — Z7902 Long term (current) use of antithrombotics/antiplatelets: Secondary | ICD-10-CM

## 2020-02-17 DIAGNOSIS — Z923 Personal history of irradiation: Secondary | ICD-10-CM

## 2020-02-17 DIAGNOSIS — R1110 Vomiting, unspecified: Secondary | ICD-10-CM | POA: Diagnosis not present

## 2020-02-17 DIAGNOSIS — Z170 Estrogen receptor positive status [ER+]: Secondary | ICD-10-CM | POA: Diagnosis not present

## 2020-02-17 DIAGNOSIS — F0390 Unspecified dementia without behavioral disturbance: Secondary | ICD-10-CM | POA: Diagnosis present

## 2020-02-17 DIAGNOSIS — M7989 Other specified soft tissue disorders: Secondary | ICD-10-CM | POA: Diagnosis not present

## 2020-02-17 DIAGNOSIS — E1122 Type 2 diabetes mellitus with diabetic chronic kidney disease: Secondary | ICD-10-CM | POA: Diagnosis present

## 2020-02-17 DIAGNOSIS — M1A0720 Idiopathic chronic gout, left ankle and foot, without tophus (tophi): Secondary | ICD-10-CM | POA: Diagnosis not present

## 2020-02-17 DIAGNOSIS — I808 Phlebitis and thrombophlebitis of other sites: Secondary | ICD-10-CM | POA: Diagnosis not present

## 2020-02-17 DIAGNOSIS — R338 Other retention of urine: Secondary | ICD-10-CM | POA: Diagnosis present

## 2020-02-17 DIAGNOSIS — Z7984 Long term (current) use of oral hypoglycemic drugs: Secondary | ICD-10-CM

## 2020-02-17 DIAGNOSIS — K269 Duodenal ulcer, unspecified as acute or chronic, without hemorrhage or perforation: Secondary | ICD-10-CM | POA: Diagnosis not present

## 2020-02-17 DIAGNOSIS — Z83438 Family history of other disorder of lipoprotein metabolism and other lipidemia: Secondary | ICD-10-CM

## 2020-02-17 LAB — URINALYSIS, ROUTINE W REFLEX MICROSCOPIC
Bilirubin Urine: NEGATIVE
Glucose, UA: NEGATIVE mg/dL
Hgb urine dipstick: NEGATIVE
Ketones, ur: NEGATIVE mg/dL
Leukocytes,Ua: NEGATIVE
Nitrite: NEGATIVE
Protein, ur: NEGATIVE mg/dL
Specific Gravity, Urine: 1.014 (ref 1.005–1.030)
pH: 5 (ref 5.0–8.0)

## 2020-02-17 LAB — CBC WITH DIFFERENTIAL/PLATELET
Abs Immature Granulocytes: 0.31 10*3/uL — ABNORMAL HIGH (ref 0.00–0.07)
Basophils Absolute: 0.1 10*3/uL (ref 0.0–0.1)
Basophils Relative: 0 %
Eosinophils Absolute: 0.1 10*3/uL (ref 0.0–0.5)
Eosinophils Relative: 1 %
HCT: 17.5 % — ABNORMAL LOW (ref 36.0–46.0)
Hemoglobin: 5.4 g/dL — CL (ref 12.0–15.0)
Immature Granulocytes: 2 %
Lymphocytes Relative: 10 %
Lymphs Abs: 1.7 10*3/uL (ref 0.7–4.0)
MCH: 27.8 pg (ref 26.0–34.0)
MCHC: 30.9 g/dL (ref 30.0–36.0)
MCV: 90.2 fL (ref 80.0–100.0)
Monocytes Absolute: 1.2 10*3/uL — ABNORMAL HIGH (ref 0.1–1.0)
Monocytes Relative: 7 %
Neutro Abs: 14.2 10*3/uL — ABNORMAL HIGH (ref 1.7–7.7)
Neutrophils Relative %: 80 %
Platelets: 326 10*3/uL (ref 150–400)
RBC: 1.94 MIL/uL — ABNORMAL LOW (ref 3.87–5.11)
RDW: 15.2 % (ref 11.5–15.5)
WBC: 17.6 10*3/uL — ABNORMAL HIGH (ref 4.0–10.5)
nRBC: 0.6 % — ABNORMAL HIGH (ref 0.0–0.2)

## 2020-02-17 LAB — BASIC METABOLIC PANEL
Anion gap: 11 (ref 5–15)
BUN: 88 mg/dL — ABNORMAL HIGH (ref 8–23)
CO2: 23 mmol/L (ref 22–32)
Calcium: 8.3 mg/dL — ABNORMAL LOW (ref 8.9–10.3)
Chloride: 103 mmol/L (ref 98–111)
Creatinine, Ser: 1.63 mg/dL — ABNORMAL HIGH (ref 0.44–1.00)
GFR, Estimated: 31 mL/min — ABNORMAL LOW (ref >60)
Glucose, Bld: 219 mg/dL — ABNORMAL HIGH (ref 70–99)
Potassium: 4.8 mmol/L (ref 3.5–5.1)
Sodium: 137 mmol/L (ref 135–145)

## 2020-02-17 LAB — OCCULT BLOOD X 1 CARD TO LAB, STOOL: Fecal Occult Bld: POSITIVE — AB

## 2020-02-17 LAB — GLUCOSE, CAPILLARY
Glucose-Capillary: 190 mg/dL — ABNORMAL HIGH (ref 70–99)
Glucose-Capillary: 201 mg/dL — ABNORMAL HIGH (ref 70–99)
Glucose-Capillary: 204 mg/dL — ABNORMAL HIGH (ref 70–99)
Glucose-Capillary: 209 mg/dL — ABNORMAL HIGH (ref 70–99)
Glucose-Capillary: 210 mg/dL — ABNORMAL HIGH (ref 70–99)

## 2020-02-17 LAB — HEMOGLOBIN AND HEMATOCRIT, BLOOD
HCT: 18.6 % — ABNORMAL LOW (ref 36.0–46.0)
Hemoglobin: 5.9 g/dL — CL (ref 12.0–15.0)

## 2020-02-17 LAB — PREPARE RBC (CROSSMATCH)

## 2020-02-17 MED ORDER — SODIUM CHLORIDE 0.45 % IV SOLN: INTRAVENOUS | Status: DC

## 2020-02-17 MED ORDER — SODIUM CHLORIDE 0.9% IV SOLUTION: Freq: Once | INTRAVENOUS | Status: AC

## 2020-02-17 MED ORDER — FLEET ENEMA 7-19 GM/118ML RE ENEM
1.0000 | ENEMA | Freq: Once | RECTAL | Status: DC | PRN
  Filled 2020-02-17: qty 1

## 2020-02-17 MED ORDER — MIRTAZAPINE 15 MG PO TABS
15.0000 mg | ORAL_TABLET | Freq: Every day | ORAL | Status: DC
  Administered 2020-02-17 – 2020-03-01 (×14): 15 mg via ORAL
  Filled 2020-02-17 (×14): qty 1

## 2020-02-17 MED ORDER — POLYETHYLENE GLYCOL 3350 17 G PO PACK: 17.0000 g | PACK | Freq: Every day | ORAL | Status: DC | PRN

## 2020-02-17 MED ORDER — FEBUXOSTAT 40 MG PO TABS
40.0000 mg | ORAL_TABLET | Freq: Every day | ORAL | Status: DC
  Administered 2020-02-18 – 2020-03-02 (×12): 40 mg via ORAL
  Filled 2020-02-17 (×14): qty 1

## 2020-02-17 MED ORDER — ONDANSETRON HCL 4 MG/2ML IJ SOLN
4.0000 mg | Freq: Four times a day (QID) | INTRAMUSCULAR | Status: DC | PRN
  Administered 2020-02-17: 4 mg via INTRAVENOUS
  Filled 2020-02-17: qty 2

## 2020-02-17 MED ORDER — ADULT MULTIVITAMIN W/MINERALS CH
1.0000 | ORAL_TABLET | Freq: Every day | ORAL | Status: DC
  Administered 2020-02-18 – 2020-03-02 (×12): 1 via ORAL
  Filled 2020-02-17 (×12): qty 1

## 2020-02-17 MED ORDER — SODIUM CHLORIDE 0.9 % IV SOLN
250.0000 mL | INTRAVENOUS | Status: DC
  Administered 2020-02-18 – 2020-02-26 (×2): 250 mL via INTRAVENOUS

## 2020-02-17 MED ORDER — ALUM & MAG HYDROXIDE-SIMETH 200-200-20 MG/5ML PO SUSP
30.0000 mL | ORAL | Status: DC | PRN
  Administered 2020-02-18 – 2020-02-21 (×3): 30 mL via ORAL
  Filled 2020-02-17 (×4): qty 30

## 2020-02-17 MED ORDER — METHOCARBAMOL 500 MG PO TABS
500.0000 mg | ORAL_TABLET | Freq: Four times a day (QID) | ORAL | Status: DC | PRN
  Administered 2020-02-21 – 2020-02-29 (×4): 500 mg via ORAL
  Filled 2020-02-17 (×4): qty 1

## 2020-02-17 MED ORDER — LINAGLIPTIN 5 MG PO TABS
5.0000 mg | ORAL_TABLET | Freq: Every day | ORAL | Status: DC
  Administered 2020-02-18 – 2020-03-02 (×12): 5 mg via ORAL
  Filled 2020-02-17 (×12): qty 1

## 2020-02-17 MED ORDER — LACTATED RINGERS IV BOLUS
500.0000 mL | Freq: Once | INTRAVENOUS | Status: AC
  Administered 2020-02-17: 500 mL via INTRAVENOUS

## 2020-02-17 MED ORDER — BUMETANIDE 1 MG PO TABS
1.0000 mg | ORAL_TABLET | Freq: Every day | ORAL | Status: DC
  Filled 2020-02-17: qty 1

## 2020-02-17 MED ORDER — DIPHENHYDRAMINE HCL 12.5 MG/5ML PO ELIX: 12.5000 mg | ORAL_SOLUTION | Freq: Four times a day (QID) | ORAL | Status: DC | PRN

## 2020-02-17 MED ORDER — METOPROLOL SUCCINATE ER 25 MG PO TB24: 12.5000 mg | ORAL_TABLET | Freq: Every day | ORAL | Status: DC

## 2020-02-17 MED ORDER — GUAIFENESIN-DM 100-10 MG/5ML PO SYRP
5.0000 mL | ORAL_SOLUTION | Freq: Four times a day (QID) | ORAL | Status: DC | PRN
  Administered 2020-02-20: 10 mL via ORAL
  Administered 2020-03-01: 5 mL via ORAL
  Filled 2020-02-17 (×4): qty 10

## 2020-02-17 MED ORDER — BISACODYL 10 MG RE SUPP
10.0000 mg | Freq: Every day | RECTAL | Status: DC | PRN
  Administered 2020-02-17: 10 mg via RECTAL
  Filled 2020-02-17: qty 1

## 2020-02-17 MED ORDER — FUROSEMIDE 10 MG/ML IJ SOLN: 20.0000 mg | Freq: Once | INTRAMUSCULAR | Status: DC

## 2020-02-17 MED ORDER — HYDROCODONE-ACETAMINOPHEN 5-325 MG PO TABS: 1.0000 | ORAL_TABLET | Freq: Four times a day (QID) | ORAL | Status: DC | PRN

## 2020-02-17 MED ORDER — GERHARDT'S BUTT CREAM
1.0000 "application " | TOPICAL_CREAM | Freq: Three times a day (TID) | CUTANEOUS | Status: DC
  Administered 2020-02-17 – 2020-03-02 (×40): 1 via TOPICAL
  Filled 2020-02-17 (×5): qty 1

## 2020-02-17 MED ORDER — ACETAMINOPHEN 325 MG PO TABS: 650.0000 mg | ORAL_TABLET | Freq: Once | ORAL | Status: DC

## 2020-02-17 MED ORDER — DIAZEPAM 5 MG PO TABS: 5.0000 mg | ORAL_TABLET | Freq: Three times a day (TID) | ORAL | Status: DC | PRN

## 2020-02-17 MED ORDER — INSULIN ASPART 100 UNIT/ML ~~LOC~~ SOLN
0.0000 [IU] | SUBCUTANEOUS | Status: DC
  Administered 2020-02-17 – 2020-02-18 (×5): 5 [IU] via SUBCUTANEOUS
  Administered 2020-02-18 (×4): 3 [IU] via SUBCUTANEOUS
  Administered 2020-02-19: 2 [IU] via SUBCUTANEOUS
  Administered 2020-02-19 (×2): 3 [IU] via SUBCUTANEOUS
  Administered 2020-02-20: 1 [IU] via SUBCUTANEOUS

## 2020-02-17 MED ORDER — ACETAMINOPHEN 325 MG PO TABS
325.0000 mg | ORAL_TABLET | ORAL | Status: DC | PRN
  Administered 2020-02-20: 325 mg via ORAL
  Filled 2020-02-17: qty 1

## 2020-02-17 MED ORDER — PHENYLEPHRINE HCL-NACL 10-0.9 MG/250ML-% IV SOLN
25.0000 ug/min | INTRAVENOUS | Status: DC
  Administered 2020-02-17: 25 ug/min via INTRAVENOUS
  Filled 2020-02-17: qty 250

## 2020-02-17 MED ORDER — SODIUM CHLORIDE 0.9% IV SOLUTION: Freq: Once | INTRAVENOUS | Status: DC

## 2020-02-17 MED ORDER — PROCHLORPERAZINE EDISYLATE 10 MG/2ML IJ SOLN
5.0000 mg | Freq: Four times a day (QID) | INTRAMUSCULAR | Status: DC | PRN
  Filled 2020-02-17: qty 2

## 2020-02-17 MED ORDER — PANTOPRAZOLE SODIUM 40 MG IV SOLR
40.0000 mg | Freq: Two times a day (BID) | INTRAVENOUS | Status: DC
  Administered 2020-02-17: 40 mg via INTRAVENOUS
  Filled 2020-02-17: qty 40

## 2020-02-17 MED ORDER — LIDOCAINE HCL URETHRAL/MUCOSAL 2 % EX GEL
CUTANEOUS | Status: DC | PRN
  Filled 2020-02-17 (×3): qty 11

## 2020-02-17 MED ORDER — FUROSEMIDE 10 MG/ML IJ SOLN: 20.0000 mg | Freq: Once | INTRAMUSCULAR | Status: AC

## 2020-02-17 MED ORDER — CEREFOLIN NAC 6-2-600 MG PO TABS: 1.0000 | ORAL_TABLET | Freq: Every morning | ORAL | Status: DC

## 2020-02-17 MED ORDER — TRAZODONE HCL 50 MG PO TABS
25.0000 mg | ORAL_TABLET | Freq: Every evening | ORAL | Status: DC | PRN
  Administered 2020-02-27: 50 mg via ORAL
  Filled 2020-02-17: qty 1

## 2020-02-17 MED ORDER — SENNOSIDES-DOCUSATE SODIUM 8.6-50 MG PO TABS
2.0000 | ORAL_TABLET | Freq: Two times a day (BID) | ORAL | Status: DC
  Filled 2020-02-17 (×3): qty 2

## 2020-02-17 MED ORDER — PHENOL 1.4 % MT LIQD
1.0000 | OROMUCOSAL | Status: DC | PRN
  Administered 2020-02-22: 1 via OROMUCOSAL
  Filled 2020-02-17 (×2): qty 177

## 2020-02-17 MED ORDER — GLUCERNA SHAKE PO LIQD
237.0000 mL | Freq: Three times a day (TID) | ORAL | Status: DC
  Administered 2020-02-19 – 2020-02-20 (×6): 237 mL via ORAL

## 2020-02-17 MED ORDER — INSULIN ASPART 100 UNIT/ML ~~LOC~~ SOLN: 0.0000 [IU] | Freq: Every day | SUBCUTANEOUS | Status: DC

## 2020-02-17 MED ORDER — MENTHOL 3 MG MT LOZG
1.0000 | LOZENGE | OROMUCOSAL | Status: DC | PRN
  Administered 2020-02-20: 3 mg via ORAL
  Filled 2020-02-17: qty 9

## 2020-02-17 MED ORDER — PROCHLORPERAZINE MALEATE 5 MG PO TABS
5.0000 mg | ORAL_TABLET | Freq: Four times a day (QID) | ORAL | Status: DC | PRN
  Filled 2020-02-17: qty 2

## 2020-02-17 MED ORDER — CEREFOLIN NAC 6-2-600 MG PO TABS
1.0000 | ORAL_TABLET | Freq: Every morning | ORAL | Status: DC
  Administered 2020-02-18 – 2020-03-02 (×11): 1 via ORAL
  Filled 2020-02-17 (×14): qty 1

## 2020-02-17 MED ORDER — EXEMESTANE 25 MG PO TABS
25.0000 mg | ORAL_TABLET | Freq: Every day | ORAL | Status: DC
  Administered 2020-02-18 – 2020-03-02 (×12): 25 mg via ORAL
  Filled 2020-02-17 (×14): qty 1

## 2020-02-17 MED ORDER — PROCHLORPERAZINE 25 MG RE SUPP
12.5000 mg | Freq: Four times a day (QID) | RECTAL | Status: DC | PRN
  Filled 2020-02-17: qty 1

## 2020-02-17 MED ORDER — NITROGLYCERIN 0.4 MG SL SUBL: 0.4000 mg | SUBLINGUAL_TABLET | SUBLINGUAL | Status: DC | PRN

## 2020-02-17 MED ORDER — ROSUVASTATIN CALCIUM 20 MG PO TABS
20.0000 mg | ORAL_TABLET | Freq: Every day | ORAL | Status: DC
  Administered 2020-02-18 – 2020-03-02 (×12): 20 mg via ORAL
  Filled 2020-02-17 (×12): qty 1

## 2020-02-17 MED ORDER — CHLORHEXIDINE GLUCONATE CLOTH 2 % EX PADS
6.0000 | MEDICATED_PAD | Freq: Every day | CUTANEOUS | Status: DC
  Administered 2020-02-18 – 2020-03-01 (×12): 6 via TOPICAL

## 2020-02-17 MED ORDER — INSULIN ASPART 100 UNIT/ML ~~LOC~~ SOLN: 0.0000 [IU] | Freq: Three times a day (TID) | SUBCUTANEOUS | Status: DC

## 2020-02-17 MED ORDER — BUMETANIDE 1 MG PO TABS: 1.0000 mg | ORAL_TABLET | Freq: Every day | ORAL | Status: DC

## 2020-02-17 NOTE — Progress Notes (Signed)
Pt received from [facility] (rehab) at 11.40 am, pt's was hypotensive on receiving, hb was 5.4, 1 unit blood started asap, Rapid response nurse, MD consulted for pt's current changing status of low BP. After pt was assessed by critical care team, Pt was transferred to higher level of care [facility] for further continuity of care, report was given to receiving nurse, second unit of blood was hanged before pt transferred to  ICU. Daughter was in bed side during entire time and aware of the situation  [place], [place]

## 2020-02-17 NOTE — Consult Note (Signed)
NAME:  [name], MRN:  [id number], DOB:  1940-07-17, LOS: 0 ADMISSION DATE:  02/17/2020, CONSULTATION DATE:  02/17/2020 REFERRING MD:  Dr. Tamala Julian, CHIEF COMPLAINT:  Hypotension/ ABLA  Brief History   79 year old female previously admitted 11/12 to 11/19 for left hip fracture s/p hemiarthroplasty 11/14 and incidentally found to have subacute CVA and small SDH. Hospital course complicated by ABLA, hypoxia due to volume overload, and urinary retention.  Discharged to CIR, however developed nausea, abdominal/ epigastric pain with hypotension with Hgb drop from 8.2-> 5.4.  Vomited x 1 with questionable aspiration now on [place].  On arrival, ongoing hemodynamic instability, PCCM consulted for further management.   History of present illness   Limited HPI from patient.  Information obtained from medical chart review and from patient's daughter, [name] at bedside.   78 year old female with prior history of CAD s/p stent, CKD stage IV, DMT2, HFpEF, and breast cancer originally admitted 11/12 to 11/19 after mechanical fall with acute left subcapital femoral neck fracture with displacement and angulation; underwent left hip hemiarthroplasty on 11/14.   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  MRI brain done and revealed thin acute right frontal lobe SDH with moderate stable small vessel disease and suspected 5 mm incidental meningioma. 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  No role for surgical intervention per Neurosurgery.  She was managed on ASA/ plavix for stroke prevention.  Post-operatively, was anemic and transfused one unit PRBC on 11/14.  Additionally, hospitalization complicated by brief hypoxia felt due to fluid  overloaded in which resolved with lasix and urinary retention.  She was discharged to CIR on 11/19.  Hemoglobin at discharge was 8.9.    On 11/20, she complained of epigastric and abdominal discomfort without SOB with ongoing constipation.  KUB showed non-obstructive gas pattered with stool present in rectum.  UA negative.  Has been afebrile.  Overnight, she developed hypotension with ongoing abdominal and epigastric pain, nausea, SOB, and with one episode of reported emesis, unclear if bloody.  Post emesis, staff report she had a wet cough with audible rales, now requiring 3L [place].  Hgb today 5.4, down from 8.2 on 11/20.  She was accepted by [facility] and on arrival, with ongoing SBP in the 70's.  2 units PRBC, CXR, and CT abd/ pelvis ordered.   PCCM consulted for ICU transfer given hemodynamic instability.   Past Medical History  CAD s/p stent, CKD stage IV, DMT2, HFpEF, breast cancer  Significant Hospital Events   11/12- 11/19 hospitalization left hip fx/ subacute CVA/ small SDH.   Consults:   Procedures:   Significant Diagnostic Tests:   Micro Data:   Antimicrobials:   Interim history/subjective:  Patient states she feels like she is leaving here.  Daughter remains at bedside.   1 unit of PRBC started.   Objective   Blood pressure (!) 77/41, pulse 92, temperature 97.6 F (36.4 C), temperature source Oral, resp. rate (!) 39, SpO2 100 %.       No intake or output data in the 24 hours ending 02/17/20 1245 There were no vitals filed for this visit.  Examination: General:  Ill appearing elderly female lying in bed  HEENT: MM pale, dry, pupils 3/reactive, some bruising to left  neck/ shoulder area  Neuro: Alert, oriented x 3, generalized weakness, MAE CV: rr, NSR, no murmur  PULM:  Tachypneic, lungs CTA- no rales GI: soft, grossly non tender, +bs Extremities: warm/dry, +1 LE edema, left hip incision site clean, approximated, some surrounding and dependent ecchymosis, site overall soft, no  obvious hematoma noted.  Skin: no rashes  Resolved Hospital Problem list    Assessment & Plan:   ABLA, unclear source P:  2 units PRBC ordered, currently on 2nd unit of PRBC Stat CT abd/ pelvis to look for source Post transfusion labs Continue PPI BID   Hypotension- thus far responsive to PRBC/ fluids P:  Transferred to ICU for close monitoring Ongoing PRBC resuscitation/ s/p 1L LR  Goal MAP > 65 Patient with poor IV access, restricted left arm, currently has two PIV's, may need central line at some point   Hypoxia, ? Aspiration post emesis P:  CXR unremarkable at this time Continue supplemental O2 for goal sat > 92% Clinically monitor for now off abx  Abd/ epigastric pain with N/V Constipation - no reported BM since surgery 11/14 P:  KUB overnight with non obstructive gas pattern.  She is passing gas.   CT a/p pending   Leukocytosis- may be reactive P:  Remains afebrile UA neg Monitor clinically for now, if develops fever, pan-culture and start empiric abx   Mild AKI on CKD stage IV Monitor for urinary retention  P:  Hemodynamic support as above with fluids and PRBC Trend renal indices  Bladder scan q 4, foley removed 11/20 Avoid nephrotoxins   HFpEF P:  Holding HTN/ diuretics with hypotension  DMT2 P:  SSI  CBG q 4  Left hip fx s/p repair 11/14 P:  outpt ortho f/u PT/ OT when able   Subacute CVA/ small SDH on previous admit P:  Non-focal Serial neuro exams Holding ASA/ plavix with ABLA  Best practice:  Diet: NPO Pain/Anxiety/Delirium protocol (if indicated): n/a VAP protocol (if indicated): n/a DVT prophylaxis: SCDs GI prophylaxis: PPI Glucose control: CBG Mobility: BR for now Code Status: Full, verified with daughter, [name], at bedside.  Wants full medical care but nothing long-term.   Family Communication: Daughter at bedside Disposition: tx to ICU   Labs   CBC: Recent Labs  Lab 02/13/20 0126 02/14/20 0101 02/15/20 0358  02/16/20 0111 02/17/20 0804  WBC 12.6* 12.4* 12.8* 13.3* 17.6*  NEUTROABS  --   --   --  10.5* 14.2*  HGB 11.1* 10.1* 8.9* 8.2* 5.4*  HCT 35.0* 31.3* 28.3* 26.2* 17.5*  MCV 89.5 88.7 90.1 90.0 90.2  PLT 181 197 243 261 706    Basic Metabolic Panel: Recent Labs  Lab 02/11/20 0623 02/12/20 0309 02/13/20 0126 02/14/20 0101 02/15/20 0358 02/16/20 0111 02/17/20 0804  NA 137   < > 136 138 140 137 137  K 4.7   < > 4.1 3.8 3.4* 3.9 4.8  CL 101   < > 103 103 106 100 103  CO2 27   < > 24 25 27 29 23   GLUCOSE 213*   < > 177* 196* 180* 185* 219*  BUN 24*   < > 40* 42* 47* 47* 88*  CREATININE 1.56*   < > 1.52* 1.45* 1.35* 1.39* 1.63*  CALCIUM 9.1   < > 8.8* 8.8* 8.5* 8.6* 8.3*  MG 2.5*  --   --  2.5*  --   --   --    < > = values in this interval not displayed.  GFR: Estimated Creatinine Clearance: 29.9 mL/min (A) (by C-G formula based on SCr of 1.63 mg/dL (H)). Recent Labs  Lab 02/14/20 0101 02/15/20 0358 02/16/20 0111 02/17/20 0804  WBC 12.4* 12.8* 13.3* 17.6*    Liver Function Tests: Recent Labs  Lab 02/16/20 0111  AST 30  ALT 22  ALKPHOS 76  BILITOT 0.6  PROT 5.3*  ALBUMIN 2.2*   No results for input(s): LIPASE, AMYLASE in the last 168 hours. No results for input(s): AMMONIA in the last 168 hours.  ABG    Component Value Date/Time   TCO2 20 (L) 02/10/2020 1525     Coagulation Profile: No results for input(s): INR, PROTIME in the last 168 hours.  Cardiac Enzymes: No results for input(s): CKTOTAL, CKMB, CKMBINDEX, TROPONINI in the last 168 hours.  HbA1C: Hgb A1c MFr Bld  Date/Time Value Ref Range Status  02/08/2020 07:53 AM 8.8 (H) 4.8 - 5.6 % Final    Comment:    (NOTE) Pre diabetes:          5.7%-6.4%  Diabetes:              >6.4%  Glycemic control for   <7.0% adults with diabetes   02/24/2018 09:12 PM 7.1 (H) 4.8 - 5.6 % Final    Comment:    (NOTE) Pre diabetes:          5.7%-6.4% Diabetes:              >6.4% Glycemic control for    <7.0% adults with diabetes     CBG: Recent Labs  Lab 02/16/20 1128 02/16/20 1623 02/16/20 2105 02/17/20 0612 02/17/20 1142  GLUCAP 163* 168* 146* 204* 190*    Review of Systems:   Review of Systems  Constitutional: Negative for chills and fever.  Respiratory: Positive for shortness of breath. Negative for cough, hemoptysis, sputum production and wheezing.   Cardiovascular: Negative for chest pain and leg swelling.  Gastrointestinal: Positive for abdominal pain, constipation, nausea and vomiting.  Genitourinary: Negative for flank pain.  Neurological: Negative for focal weakness, loss of consciousness and headaches.   Past Medical History  She,  has a past medical history of Anemia, Anxiety, Arthritis, Breast cancer ([place]), Breast cancer in female [facility]), CAD S/P percutaneous coronary angioplasty (2007; March 2011), Cancer [facility]), Charcot's joint of foot, right, Charcot's joint of left foot, Chronic diastolic CHF (congestive heart failure), NYHA class 2 ([place]) (02/28/2018), Chronic kidney disease, Coronary artery disease, Dementia ([place]), Diabetes mellitus type 2 with neurological manifestations ([place]), Diabetes mellitus without complication ([place]), Dyslipidemia, goal LDL below 70, Foot pain, bilateral, Full dentures, Genetic testing (12/03/2016), GERD (gastroesophageal reflux disease), Gout, Headache(784.0), History of hematuria, Hypertension, essential, benign, Migraine, Mild cognitive impairment, Peripheral neuropathy, Personal history of radiation therapy, Post herpetic neuralgia, Restless leg syndrome, Stroke ([place]), TIA (transient ischemic attack), TIA (transient ischemic attack), and Wears glasses.   Surgical History    Past Surgical History:  Procedure Laterality Date  . ABDOMINAL HYSTERECTOMY    . ANKLE FUSION Right 02/22/2018   Procedure: RIGHT SUBTALAR AND TALONAVICULAR FUSION;  Surgeon: Newt Minion, MD;  Location: [place];  Service: Orthopedics;  Laterality: Right;  . APPENDECTOMY     . BLADDER SUSPENSION    . BREAST LUMPECTOMY Bilateral 2018  . BREAST LUMPECTOMY WITH RADIOACTIVE SEED AND SENTINEL LYMPH NODE BIOPSY Bilateral 12/14/2016   Procedure: BILATERAL BREAST LUMPECTOMY WITH BILATERAL  RADIOACTIVE SEED AND BILATERAL AXILLARY  SENTINEL LYMPH NODE BIOPSY ERAS PATHWAY;  Surgeon: Alphonsa Overall, MD;  Location:  [facility];  Service: General;  Laterality: Bilateral;  PECTORAL BLOCK  . CARDIAC CATHETERIZATION  02/24/2006   85% stenosis in the proximal portion of LAD-arrangements made for PCI on 02/25/2006  . CARDIAC CATHETERIZATION  02/25/2006   80% LAD lesion stented with a 3x77mm Liberte stent resulting in reduction of 80% lesion to 0% residual  . CARDIAC CATHETERIZATION  04/26/2006   Medical management  . CARDIAC CATHETERIZATION  06/24/2009   60-70% re-stenosis in the proximal LAD. A 3.25x15 cutting balloon, 3 inflations - 14atm-38sec, 13atm-39sec, and 12atm-40sec reduced to less than 10%. 60% stenosis of the mid/distal LAD stented with a 3x81mm Multilink stent.  Marland Kitchen CARDIAC CATHETERIZATION  07/09/2009   Medical management  . CARDIAC CATHETERIZATION    . CAROTID DOPPLER  06/24/2009   40-59% R ICA stenosis. No significant ICA stenosis noted  . CHOLECYSTECTOMY    . CORONARY ANGIOPLASTY    . DILATION AND CURETTAGE OF UTERUS    . HIP ARTHROPLASTY Left 02/10/2020   Procedure: ARTHROPLASTY BIPOLAR HIP (HEMIARTHROPLASTY) POSTERIOR LATERAL;  Surgeon: [name], MD;  Location: [place];  Service: Orthopedics;  Laterality: Left;  . JOINT REPLACEMENT Left   . LESION REMOVAL Right 03/11/2015   Procedure:  EXCISION OF RIGHT PRE TIBIAL LESION;  Surgeon: Judeth Horn, MD;  Location: [place];  Service: General;  Laterality: Right;  . MASS EXCISION Left 06/10/2014   Procedure: EXCISION LEFT LOWER LEG LESION;  Surgeon: Doreen Salvage, MD;  Location: [place];  Service: General;  Laterality: Left;  Marland Kitchen MASS EXCISION Left 09/05/2015   Procedure: EXCISION OF LEFT  FOREARM  MASS;  Surgeon: Judeth Horn, MD;  Location: [place];  Service: General;  Laterality: Left;  . NM MYOVIEW LTD  10/2011   No ischemia or infarction.  Normal EF.  LOW RISK. (Short bursts of 5 beats NSVT during recovery otherwise normal)  . SHOULDER ARTHROSCOPY  10/15  . SHOULDER ARTHROSCOPY  1995   left  . TEE WITHOUT CARDIOVERSION N/A 08/03/2017   Procedure: TRANSESOPHAGEAL ECHOCARDIOGRAM (TEE);  Surgeon: Skeet Latch, MD;  Location: [place];  Service: Cardiovascular;; EF 60-65% with no wall motion normalities.  No atrial thrombus noted.  Lightly findings consistent with a lipomatous hypertrophy of the atrial septum.   . TENDON REPAIR Left 05/12/2016   Procedure: Left Anterior Tibial Tendon Reconstruction;  Surgeon: Newt Minion, MD;  Location: [place];  Service: Orthopedics;  Laterality: Left;  . TOTAL KNEE ARTHROPLASTY  2005   left  . TRANSESOPHAGEAL ECHOCARDIOGRAM  08/03/2016   : EF 60-65% no wall motion normalities.  No atrial thrombus.  Lipomatous hypertrophy of the atrial septum.  No mass noted.  . TRANSESOPHAGEAL ECHOCARDIOGRAM  07/2017   EF 60 to 65%.  No R WMA.  No atrial thrombus noted.  Lipomatous IAS.  Marland Kitchen TRANSTHORACIC ECHOCARDIOGRAM  02/25/2017   :EF 65-70%.  GR 1 DD.  No or WMA.  Moderate aortic calcification/sclerosis but no stenosis.  . TRANSTHORACIC ECHOCARDIOGRAM  07/2017   Vigorous LV function.  EF 65-70%.  Mild diastolic dysfunction with no RWMA. GR 1 DD.  Mild aortic sclerosis/no stenosis.  Questionable right atrial mass discounted with TEE)   . WRIST ARTHROPLASTY  2010   cancer lt wrist     Social History   reports that she has never smoked. She has never used smokeless tobacco. She reports that she does not drink alcohol and does not use drugs.   Family History   Her family  history includes ALS in her brother; Arthritis in her sister; Breast cancer (age of onset: 81) in her sister; Breast cancer (age of onset: 35) in her daughter; COPD  in her daughter; Cancer in her paternal aunt, paternal uncle, and paternal uncle; Cervical cancer (age of onset: 1) in her sister; Diabetes in her brother, brother, mother, sister, sister, and sister; Emphysema in her father; Heart attack in her father; Heart attack (age of onset: 37) in her daughter; Heart disease in her father; Hyperlipidemia in her brother; Hypertension in her brother, brother, father, mother, sister, and sister; Kidney disease in her mother; Stomach cancer (age of onset: 29) in her sister; Stroke in her brother.   Allergies Allergies  Allergen Reactions  . Nsaids Nausea And Vomiting and Palpitations  . Reglan [Metoclopramide] Other (See Comments)    Seizures   . Tramadol Other (See Comments)    Seizures   . Ultram [Tramadol] Other (See Comments)    Pt has seizure activity with this medication  . Lipitor [Atorvastatin] Other (See Comments)    Bone and muscle pain  . Lipitor [Atorvastatin] Swelling and Other (See Comments)    Pain   . Lyrica [Pregabalin] Other (See Comments)    Weight gain, extremity swelling  . Lyrica [Pregabalin] Swelling  . Reglan [Metoclopramide] Other (See Comments)    Tardive dyskinesia; paradoxical reaction not relieved by benadryl  . Nsaids Swelling    SWELLING REACTION UNSPECIFIED   . Urecholine [Bethanechol]     Palpitations and nausea     Home Medications  Prior to Admission medications   Medication Sig Start Date End Date Taking? Authorizing Provider  bethanechol (URECHOLINE) 25 MG tablet Take 1 tablet (25 mg total) by mouth 3 (three) times daily. 02/15/20 03/16/20  Para Skeans, MD  BIOTIN PO Take 1 tablet by mouth daily.    [provider]  bumetanide (BUMEX) 2 MG tablet Take 2 mg by mouth daily. 2 in morning and 1 in afternoon    [provider]  Calcium Carb-Cholecalciferol (CALCIUM 600+D3) 600-800 MG-UNIT TABS Take 1 tablet by mouth daily.    [provider]  Cholecalciferol (VITAMIN D) 50 MCG (2000  UT) tablet Take 2,000 Units by mouth daily.    [provider]  clopidogrel (PLAVIX) 75 MG tablet TAKE 1 TABLET DAILY 04/15/19   Leonie Man, MD  clopidogrel (PLAVIX) 75 MG tablet Take 75 mg by mouth daily. 01/10/20   [provider]  clopidogrel (PLAVIX) 75 MG tablet Take 1 tablet (75 mg total) by mouth daily. 02/16/20 03/17/20  Para Skeans, MD  Coenzyme Q10 (COQ-10 PO) Take 1 tablet by mouth daily.    [provider]  colchicine 0.6 MG tablet Take 0.6 mg by mouth daily as needed (for up to 3 days for gout flare).    [provider]  COLCRYS 0.6 MG tablet TAKE 1 TABLET DAILY AS NEEDED FOR GOUT FLARE. 12/27/18   Newt Minion, MD  Cranberry 500 MG TABS Take 500 mg by mouth every evening.    [provider]  CRANBERRY-CALCIUM PO Take 1 tablet by mouth daily.    [provider]  diazepam (VALIUM) 5 MG tablet Take 1 tablet (5 mg total) by mouth 2 (two) times daily as needed for anxiety. 03/02/18   Dessa Phi, DO  diazepam (VALIUM) 5 MG tablet Take 5 mg by mouth 3 (three) times daily as needed. 10/05/19   [provider]  exemestane (AROMASIN) 25 MG tablet  TAKE 1 TABLET DAILY AFTER BREAKFAST 03/12/19   T[place] Merle, MD  febuxostat (ULORIC) 40 MG tablet TAKE 1 TABLET DAILY 10/02/19   Newt Minion, MD  febuxostat (ULORIC) 40 MG tablet Take 40 mg by mouth daily. 12/31/19   [provider]  feeding supplement, GLUCERNA SHAKE, (GLUCERNA SHAKE) LIQD Take 237 mLs by mouth 3 (three) times daily between meals for 7 days. 02/15/20 02/22/20  Para Skeans, MD  gabapentin (NEURONTIN) 300 MG capsule Take 1 capsule (300 mg total) by mouth 2 (two) times daily. Take 1 during the day if needed and 2 at night 04/06/18   Dennie Bible, NP  gabapentin (NEURONTIN) 300 MG capsule Take 300 mg by mouth 2 (two) times daily.  12/18/19   [provider]  JANUVIA 25 MG tablet Take 25 mg by mouth daily. 12/26/19   [provider]   JANUVIA 25 MG tablet Take 25 mg by mouth daily. 12/26/19   [provider]  Javier Docker Oil 500 MG CAPS Take 500 mg by mouth daily.    [provider]  KRILL OIL PO Take 1 capsule by mouth daily.    [provider]  Methylfol-Methylcob-Acetylcyst (CEREFOLIN NAC) 6-2-600 MG TABS TAKE 1 TABLET BY MOUTH EVERY DAY 06/07/19   Garvin Fila, MD  Methylfol-Methylcob-Acetylcyst (CEREFOLIN NAC) 6-2-600 MG TABS Take 1 tablet by mouth in the morning.    [provider]  metolazone (ZAROXOLYN) 2.5 MG tablet TAKE 1 TABLET DAILY 30 MINUTES BEFORE THE MORNING DOSE OF BUMEX AS DIRECTED 08/09/18   Leonie Man, MD  metolazone (ZAROXOLYN) 2.5 MG tablet Take 2.5 mg by mouth daily. 30 minutes before Bumetanide Mon. & Thurs.    [provider]  metoprolol succinate (TOPROL-XL) 25 MG 24 hr tablet Take 12.5 mg by mouth daily.    [provider]  metoprolol succinate (TOPROL-XL) 25 MG 24 hr tablet Take 25 mg by mouth at bedtime.  12/27/19   [provider]  mirtazapine (REMERON) 15 MG tablet Take 15 mg by mouth at bedtime. Taking 1 1/2 tablet at bedtime    [provider]  mirtazapine (REMERON) 15 MG tablet Take 15 mg by mouth at bedtime.  01/29/20   [provider]  nitroGLYCERIN (NITROLINGUAL) 0.4 MG/SPRAY spray Place 1 spray under the tongue every 5 (five) minutes x 3 doses as needed for chest pain.    [provider]  nitroGLYCERIN (NITROSTAT) 0.4 MG SL tablet Place 0.4 mg under the tongue every 5 (five) minutes as needed for chest pain.    [provider]  oxyCODONE-acetaminophen (PERCOCET/ROXICET) 5-325 MG tablet Take 1 tablet by mouth every 6 (six) hours as needed for severe pain. 07/30/19   Persons, Bevely Palmer, PA  pantoprazole (PROTONIX) 40 MG tablet Take 40 mg by mouth daily.    [provider]  potassium chloride (KLOR-CON) 20 MEQ packet Take 20 mEq by mouth 2 (two) times daily. 2 tablets in the morning and 1 at  bedtime    [provider]  pramipexole (MIRAPEX) 0.25 MG tablet TAKE 1 TO 2 TABLETS AS INSTRUCTED, 1 TABLET AT 4:00 P.M. AND 2 TABLETS AT BEDTIME 09/27/19   Frann Rider, NP  pramipexole (MIRAPEX) 0.25 MG tablet Take 0.75 mg by mouth at bedtime.  12/26/19   [provider]  Resveratrol 250 MG CAPS Take 500 mg by mouth daily.    [provider]  rosuvastatin (CRESTOR) 20 MG tablet Take 20 mg by mouth daily.  [provider]  rosuvastatin (CRESTOR) 5 MG tablet Take 5 mg by mouth daily.    [provider]  sennosides-docusate sodium (SENOKOT-S) 8.6-50 MG tablet Take 1 tablet by mouth daily as needed for constipation.    [provider]  Ubiquinol (QUNOL COQ10/UBIQUINOL/MEGA) 100 MG CAPS Take 100 mg by mouth every evening. CoQ10, Qunol     [provider]     Critical care time: 60 mins     Kennieth Rad, ACNP [facility] 02/17/2020, 1:46 PM

## 2020-02-17 NOTE — Progress Notes (Addendum)
[place] PHYSICAL MEDICINE & REHABILITATION PROGRESS NOTE   Subjective/Complaints:  Pt feels tired today , recieved IVF last noc , no SOB, No BM, + nausea with poor intake since admit  Objective:   DG Abd 1 View  Result Date: 02/16/2020 CLINICAL DATA:  Nausea, abdominal pain EXAM: ABDOMEN - 1 VIEW COMPARISON:  None. FINDINGS: The stomach is gas filled. Nonobstructive pattern of bowel gas with scattered gas and stool present to the rectum. No free air in the abdomen on supine radiographs. Status post left hip total arthroplasty. No radio-opaque calculi or other significant radiographic abnormality are seen. IMPRESSION: The stomach is gas filled. Nonobstructive pattern of bowel gas with scattered gas and stool present to the rectum. No free air in the abdomen on supine radiographs. Electronically Signed   By: Eddie Candle M.D.   On: 02/16/2020 20:59   Recent Labs    02/15/20 0358 02/16/20 0111  WBC 12.8* 13.3*  HGB 8.9* 8.2*  HCT 28.3* 26.2*  PLT 243 261   Recent Labs    02/15/20 0358 02/16/20 0111  NA 140 137  K 3.4* 3.9  CL 106 100  CO2 27 29  GLUCOSE 180* 185*  BUN 47* 47*  CREATININE 1.35* 1.39*  CALCIUM 8.5* 8.6*    Intake/Output Summary (Last 24 hours) at 02/17/2020 0724 Last data filed at 02/17/2020 0425 Gross per 24 hour  Intake 579.78 ml  Output 1975 ml  Net -1395.22 ml        Physical Exam: Vital Signs Blood pressure (!) 95/50, pulse 98, temperature 98 F (36.7 C), temperature source Oral, resp. rate 17, height 5\' 6"  (1.676 m), weight 85.7 kg, SpO2 100 %.  General: No acute distress Mood and affect are appropriate Heart: Regular rate and rhythm no rubs murmurs or extra sounds Lungs: Clear to auscultation, breathing unlabored, no rales or wheezes Abdomen: Positive bowel sounds, soft nontender to palpation, nondistended Extremities: No clubbing, cyanosis, or edema  Musculoskeletal: Pain to palpation Left thigh  No joint swelling  Neurologic:  Cranial nerves II through XII intact, motor strength is 5/5 in bilateral deltoid, bicep, tricep, grip, RIght hip flexor, knee extensors, ankle dorsiflexor and plantar flexor, 4/5 on left side  Sensory exam normal sensation to light touch and proprioception in bilateral upper and lower extremities  Musculoskeletal: reduced L hip arom   Assessment/Plan: 1. Functional deficits which require 3+ hours per day of interdisciplinary therapy in a comprehensive inpatient rehab setting.  Physiatrist is providing close team supervision and 24 hour management of active medical problems listed below.  Physiatrist and rehab team continue to assess barriers to discharge/monitor patient progress toward functional and medical goals  Care Tool:  Bathing  Bathing activity did not occur: Safety/medical concerns (decreased BP)           Bathing assist       Upper Body Dressing/Undressing Upper body dressing Upper body dressing/undressing activity did not occur (including orthotics): Refused (decreased BP and pt not feeling well)      Upper body assist      Lower Body Dressing/Undressing Lower body dressing    Lower body dressing activity did not occur: Refused       Lower body assist       Toileting Toileting Toileting Activity did not occur (Clothing management and hygiene only): N/A (no void or bm)  Toileting assist       Transfers Chair/bed transfer  Transfers assist     Chair/bed transfer assist level: Moderate Assistance -  Patient 50 - 74%     Locomotion Ambulation   Ambulation assist      Assist level: Moderate Assistance - Patient 50 - 74% Assistive device: Walker-rolling Max distance: 15   Walk 10 feet activity   Assist     Assist level: Moderate Assistance - Patient - 50 - 74% Assistive device: Walker-rolling   Walk 50 feet activity   Assist Walk 50 feet with 2 turns activity did not occur: Safety/medical concerns         Walk 150 feet  activity   Assist Walk 150 feet activity did not occur: Safety/medical concerns         Walk 10 feet on uneven surface  activity   Assist Walk 10 feet on uneven surfaces activity did not occur: Safety/medical concerns         Wheelchair     Assist Will patient use wheelchair at discharge?: Yes Type of Wheelchair: Manual    Wheelchair assist level: Minimal Assistance - Patient > 75% Max wheelchair distance: 100'    Wheelchair 50 feet with 2 turns activity    Assist        Assist Level: Minimal Assistance - Patient > 75%   Wheelchair 150 feet activity     Assist      Assist Level: Maximal Assistance - Patient 25 - 49%   Blood pressure (!) 95/50, pulse 98, temperature 98 F (36.7 C), temperature source Oral, resp. rate 17, height 5\' 6"  (1.676 m), weight 85.7 kg, SpO2 100 %.  Medical Problem List and Plan: 1.  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  Antithrombotics: -DVT/anticoagulation:  Mechanical: Sequential compression devices, below knee Bilateral lower extremities             -antiplatelet therapy: Plavix 3. Neuropathy/charcot feet/Pain Management: Uses braces and gabapentin for pain.  4. Mood: LCSW to follow for evaluation and support.              -antipsychotic agents: N/A 5. Neuropsych: This patient is not fully capable of making decisions on her own behalf. 6. Skin/Wound Care: Monitor incision for healing. Continue supplements to promote wound healing.  7. Fluids/Electrolytes/Nutrition: Monitor I/O. Change glucerna to with meals.  8. Left femur fracture: WBAT with posterior hip precautions.  9. 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 SCr better but BUN with rise--> d/c Naprosyn.  Will order orthostatic vitals and monitor Lytes with serial checks. . 10. CAD s/p PCI/stent: Monitor for symptoms with activity.  11. T2DM:  Hgb A1c-8.8.   Monitor BS ac/hs --resume Januvia and use SSI for elevated  BS. CBGs have been moderately elevated.  CBG (last 3)  Recent Labs    02/16/20 1623 02/16/20 2105 02/17/20 0612  GLUCAP 168* 146* 204*    12. Chronic diastolic CHF: Monitor for signs of overload--order flutter valve for pulmonary hygiene. Her oxygen is being weaned off today--order prn use with activity. Will check weights daily. On metoprolol, Plavix, Crestor and Bumex resumed 11/17.  13. Urinary retention/Leucocytosis: Check UA/UCS--d/c foley in am and start bladder training. Discontinue urecholine-->on Valium (to relax bladder). UA was positive on 11/17 but culture showed no growth. Voiding trial succesful, UA neg  14. Acute blood loss anemia: Monitor H/H for trends--13.3-->8.8--> 8.9- repeat today5.4 will need to transfuse 2 U slowly  given CHF15. Peripheral edema: 2+ edema-> good day for legs 11/19 per family.  16. Insomnia: Trazodone 25--50mg  HS PRN  17.  Constipation, pt wants to drink her coffee and try toileting with therapy , if no result dulc supp this pm  18.  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

## 2020-02-17 NOTE — H&P (Addendum)
History and Physical    [name] DGU:[id number] DOB: [birth year] DOA: (Not on file)  Referring MD/NP/PA: Alysia Penna, MD PCP: [name], DO  Patient coming from: Transfer from rehab  Chief Complaint:   I have personally briefly reviewed patient's old medical records in [place]   HPI: [name] is a 79 y.o. female with medical history significant of CAD s/p stent to LAD in 2006 followed by [name], CKD stage IV followed by [name], diabetes mellitus type 2, chronic diastolic CHF last EF 95-63% with grade 1 diastolic dysfunction, breast ca, and bilateral lower extremity edema. who had initially been admitted into the hospital on 11/12 after having a fall found to have a left hip fracture status post left hip hemiarthroplasty.  During the fall she reportedly hit her head and was also noted to have a acute 4 mm subdural hematoma of the right frontal lobe for which neurology have been consulted along with neurosurgery recommending supportive care and was likely traumatic in nature.  During hospitalization hemoglobin was initially noted to be 13.3 and she received at least 1 unit of packed red blood cells intraoperatively for blood loss on 11/14.  Hemoglobin continued to trended down to 8.9 prior to discharge to inpatient rehab.  Since discharge patient reports that she has not had a bowel movement.  At rehab today patient blood pressures dropped down to 82/43 patient's hemoglobin was was seen to continue to trend downward and noted to be 5.4 g/dL with creatinine trending up to 1.63 and BUN 88.  On recheck this morning.  She complains of fatigue, shortness of breath, generalized abdominal discomfort, nausea, vomiting x 1 episode this morning, and constipation.  Normally she is not on oxygen at home.  Patient notes that she has not had a bowel movement since her hip surgery.  Patient notes that she feels like she is leaving here.  ED Course:  As seen above.  Review of  Systems  Constitutional: Positive for malaise/fatigue. Negative for fever.  Eyes: Negative for double vision and photophobia.  Cardiovascular: Positive for leg swelling. Negative for chest pain.  Gastrointestinal: Positive for abdominal pain, constipation, nausea and vomiting.  Genitourinary: Negative for hematuria.  Musculoskeletal: Positive for joint pain and myalgias.    Past Medical History:  Diagnosis Date   Anemia    have received iron infusions   Anxiety    Arthritis     osteoarthritis   Breast cancer ([place])    Breast cancer in female [facility])    Bilateral   CAD S/P percutaneous coronary angioplasty 2007; March 2011   [facility] 3.0 mm 20 mm postdilated 3.6 mm in early mid LAD; status post ISR Cutting Balloon PTCA and March 11 along with PCI of distal mid lesion with a 3.0 mm 12 mm MultiLink vision BMS; the proximal stent causes jailing of SP1 and SP2 with ostial 70-80% lesions   Cancer ([employer])    Melanoma, Squamous cell Carcinoma   Charcot's joint of foot, right    Charcot's joint of left foot    Chronic diastolic CHF (congestive heart failure), NYHA class 2 ([place]) 02/28/2018   Chronic kidney disease    stage 2    Coronary artery disease    Dementia (HCC)    mild   Diabetes mellitus type 2 with neurological manifestations ([place])    Diabetes mellitus without complication (HCC)    Dyslipidemia, goal LDL below 70    Foot pain, bilateral  Full dentures    Genetic testing 12/03/2016   Germline genetic testing was performed through [employer]'s Common Hereditary Cancers Panel + [facility]'s Melanoma Panel. This custom panel includes analysis of the following 51 genes: APC, ATM, AXIN2, BAP1, BARD1, BMPR1A, BRCA1, BRCA2, BRIP1, CDH1, CDK4, CDKN2A, CHEK2, CTNNA1, DICER1, EPCAM, GREM1, HOXB13, KIT, MEN1, MITF, MLH1, MSH2, MSH3, MSH6, MUTYH, NBN, NF1, NTHL1, PALB2, PDGFRA, PMS2, POLD1, POL   GERD (gastroesophageal reflux disease)    Gout    Headache(784.0)    History  of hematuria    Followed by Dr. Gaynelle Arabian   Hypertension, essential, benign    Migraine    Mild cognitive impairment    Peripheral neuropathy    Personal history of radiation therapy    Post herpetic neuralgia    Restless leg syndrome    Stroke ([place])    TIA (transient ischemic attack)    multiple in the past   TIA (transient ischemic attack)    Wears glasses     Past Surgical History:  Procedure Laterality Date   ABDOMINAL HYSTERECTOMY     ANKLE FUSION Right 02/22/2018   Procedure: RIGHT SUBTALAR AND TALONAVICULAR FUSION;  Surgeon: Newt Minion, MD;  Location: [place];  Service: Orthopedics;  Laterality: Right;   APPENDECTOMY     BLADDER SUSPENSION     BREAST LUMPECTOMY Bilateral 2018   BREAST LUMPECTOMY WITH RADIOACTIVE SEED AND SENTINEL LYMPH NODE BIOPSY Bilateral 12/14/2016   Procedure: BILATERAL BREAST LUMPECTOMY WITH BILATERAL  RADIOACTIVE SEED AND BILATERAL AXILLARY  SENTINEL LYMPH NODE BIOPSY ERAS PATHWAY;  Surgeon: Alphonsa Overall, MD;  Location: [place];  Service: General;  Laterality: Bilateral;  [place]  02/24/2006   85% stenosis in the proximal portion of LAD-arrangements made for PCI on 02/25/2006   CARDIAC CATHETERIZATION  02/25/2006   80% LAD lesion stented with a 3x59m Liberte stent resulting in reduction of 80% lesion to 0% residual   CARDIAC CATHETERIZATION  04/26/2006   Medical management   CARDIAC CATHETERIZATION  06/24/2009   60-70% re-stenosis in the proximal LAD. A 3.25x15 cutting balloon, 3 inflations - 14atm-38sec, 13atm-39sec, and 12atm-40sec reduced to less than 10%. 60% stenosis of the mid/distal LAD stented with a 3x132mMultilink stent.   CARDIAC CATHETERIZATION  07/09/2009   Medical management   CARDIAC CATHETERIZATION     CAROTID DOPPLER  06/24/2009   40-59% R ICA stenosis. No significant ICA stenosis noted   CHOLECYSTECTOMY     CORONARY ANGIOPLASTY     DILATION AND CURETTAGE OF UTERUS      HIP ARTHROPLASTY Left 02/10/2020   Procedure: ARTHROPLASTY BIPOLAR HIP (HEMIARTHROPLASTY) POSTERIOR LATERAL;  Surgeon: MuRenette ButtersMD;  Location: [facility] Service: Orthopedics;  Laterality: Left;   JOINT REPLACEMENT Left    LESION REMOVAL Right 03/11/2015   Procedure:  EXCISION OF RIGHT PRE TIBIAL LESION;  Surgeon: JaJudeth HornMD;  [place] [facility] Service: General;  Laterality: Right;   MASS EXCISION Left 06/10/2014   Procedure: EXCISION LEFT LOWER LEG LESION;  Surgeon: JaDoreen SalvageMD;  [facility] Service: General;  Laterality: Left;   MASS EXCISION Left 09/05/2015   Procedure: EXCISION OF LEFT FOREARM  MASS;  Surgeon: JaJudeth HornMD;  Location: [facility] Service: General;  Laterality: Left;   NM MYOVIEW LTD  10/2011   No ischemia or infarction.  Normal EF.  LOW RISK. (Short bursts of 5 beats NSVT during recovery otherwise normal)  SHOULDER ARTHROSCOPY  10/15   SHOULDER ARTHROSCOPY  1995   left   TEE WITHOUT CARDIOVERSION N/A 08/03/2017   Procedure: TRANSESOPHAGEAL ECHOCARDIOGRAM (TEE);  Surgeon: Skeet Latch, MD;  Location: [place];  Service: Cardiovascular;; EF 60-65% with no wall motion normalities.  No atrial thrombus noted.  Lightly findings consistent with a lipomatous hypertrophy of the atrial septum.    TENDON REPAIR Left 05/12/2016   Procedure: Left Anterior Tibial Tendon Reconstruction;  Surgeon: Newt Minion, MD;  Location: [place];  Service: Orthopedics;  Laterality: Left;   TOTAL KNEE ARTHROPLASTY  2005   left   TRANSESOPHAGEAL ECHOCARDIOGRAM  08/03/2016   : EF 60-65% no wall motion normalities.  No atrial thrombus.  Lipomatous hypertrophy of the atrial septum.  No mass noted.   TRANSESOPHAGEAL ECHOCARDIOGRAM  07/2017   EF 60 to 65%.  No R WMA.  No atrial thrombus noted.  Lipomatous IAS.   TRANSTHORACIC ECHOCARDIOGRAM  02/25/2017   :EF 65-70%.  GR 1 DD.  No or WMA.  Moderate aortic  calcification/sclerosis but no stenosis.   TRANSTHORACIC ECHOCARDIOGRAM  07/2017   Vigorous LV function.  EF 65-70%.  Mild diastolic dysfunction with no RWMA. GR 1 DD.  Mild aortic sclerosis/no stenosis.  Questionable right atrial mass discounted with TEE)    WRIST ARTHROPLASTY  2010   cancer lt wrist     reports that she has never smoked. She has never used smokeless tobacco. She reports that she does not drink alcohol and does not use drugs.  Allergies  Allergen Reactions   Nsaids Nausea And Vomiting and Palpitations   Reglan [Metoclopramide] Other (See Comments)    Seizures    Tramadol Other (See Comments)    Seizures    Ultram [Tramadol] Other (See Comments)    Pt has seizure activity with this medication   Lipitor [Atorvastatin] Other (See Comments)    Bone and muscle pain   Lipitor [Atorvastatin] Swelling and Other (See Comments)    Pain    Lyrica [Pregabalin] Other (See Comments)    Weight gain, extremity swelling   Lyrica [Pregabalin] Swelling   Reglan [Metoclopramide] Other (See Comments)    Tardive dyskinesia; paradoxical reaction not relieved by benadryl   Nsaids Swelling    SWELLING REACTION UNSPECIFIED    Urecholine [Bethanechol]     Palpitations and nausea    Family History  Problem Relation Age of Onset   Diabetes Mother    Kidney disease Mother    Diabetes Sister    Diabetes Brother    Hypertension Mother    Hypertension Father    Emphysema Father    Heart attack Father    Heart disease Father    Arthritis Sister    Diabetes Sister    Hypertension Sister    Breast cancer Sister 33       treated with mastectomy   Cervical cancer Sister 74   Hypertension Brother    Hyperlipidemia Brother    Diabetes Brother    Stroke Brother    Diabetes Sister    Hypertension Sister    Stomach cancer Sister 63   Hypertension Brother    ALS Brother        d.62s   Breast cancer Daughter 70       triple negative treated with  lumpectomy, chemo, radiation   Heart attack Daughter 52       d.45   COPD Daughter    Cancer Paternal Aunt        unspecified  type   Cancer Paternal Uncle        unspecified type   Cancer Paternal Uncle        unspecified type    Prior to Admission medications   Medication Sig Start Date End Date Taking? Authorizing Provider  bethanechol (URECHOLINE) 25 MG tablet Take 1 tablet (25 mg total) by mouth 3 (three) times daily. 02/15/20 03/16/20  Para Skeans, MD  BIOTIN PO Take 1 tablet by mouth daily.    [provider]  bumetanide (BUMEX) 2 MG tablet Take 2 mg by mouth daily. 2 in morning and 1 in afternoon    [provider]  Calcium Carb-Cholecalciferol (CALCIUM 600+D3) 600-800 MG-UNIT TABS Take 1 tablet by mouth daily.    [provider]  Cholecalciferol (VITAMIN D) 50 MCG (2000 UT) tablet Take 2,000 Units by mouth daily.    [provider]  clopidogrel (PLAVIX) 75 MG tablet TAKE 1 TABLET DAILY 04/15/19   Leonie Man, MD  clopidogrel (PLAVIX) 75 MG tablet Take 75 mg by mouth daily. 01/10/20   [provider]  clopidogrel (PLAVIX) 75 MG tablet Take 1 tablet (75 mg total) by mouth daily. 02/16/20 03/17/20  Para Skeans, MD  Coenzyme Q10 (COQ-10 PO) Take 1 tablet by mouth daily.    [provider]  colchicine 0.6 MG tablet Take 0.6 mg by mouth daily as needed (for up to 3 days for gout flare).    [provider]  COLCRYS 0.6 MG tablet TAKE 1 TABLET DAILY AS NEEDED FOR GOUT FLARE. 12/27/18   Newt Minion, MD  Cranberry 500 MG TABS Take 500 mg by mouth every evening.    [provider]  CRANBERRY-CALCIUM PO Take 1 tablet by mouth daily.    [provider]  diazepam (VALIUM) 5 MG tablet Take 1 tablet (5 mg total) by mouth 2 (two) times daily as needed for anxiety. 03/02/18   Dessa Phi, DO  diazepam (VALIUM) 5 MG tablet Take 5 mg by mouth 3 (three) times daily as needed. 10/05/19   [provider]  exemestane (AROMASIN) 25 MG tablet TAKE 1 TABLET DAILY AFTER BREAKFAST 03/12/19   Truitt Merle, MD  febuxostat (ULORIC) 40 MG tablet TAKE 1 TABLET DAILY 10/02/19   Newt Minion, MD  febuxostat (ULORIC) 40 MG tablet Take 40 mg by mouth daily. 12/31/19   [provider]  feeding supplement, GLUCERNA SHAKE, (GLUCERNA SHAKE) LIQD Take 237 mLs by mouth 3 (three) times daily between meals for 7 days. 02/15/20 02/22/20  Para Skeans, MD  gabapentin (NEURONTIN) 300 MG capsule Take 1 capsule (300 mg total) by mouth 2 (two) times daily. Take 1 during the day if needed and 2 at night 04/06/18   Dennie Bible, NP  gabapentin (NEURONTIN) 300 MG capsule Take 300 mg by mouth 2 (two) times daily.  12/18/19   [provider]  JANUVIA 25 MG tablet Take 25 mg by mouth daily. 12/26/19   [provider]  JANUVIA 25 MG tablet Take 25 mg by mouth daily. 12/26/19   [provider]  Javier Docker Oil 500 MG CAPS Take 500 mg by mouth daily.    [provider]  KRILL OIL PO Take 1 capsule by mouth daily.    [provider]  Methylfol-Methylcob-Acetylcyst (CEREFOLIN NAC) 6-2-600 MG TABS TAKE 1 TABLET BY MOUTH EVERY DAY 06/07/19   Garvin Fila, MD  Methylfol-Methylcob-Acetylcyst (CEREFOLIN NAC) 6-2-600 MG TABS Take 1 tablet by  mouth in the morning.    [provider]  metolazone (ZAROXOLYN) 2.5 MG tablet TAKE 1 TABLET DAILY 30 MINUTES BEFORE THE MORNING DOSE OF BUMEX AS DIRECTED 08/09/18   Leonie Man, MD  metolazone (ZAROXOLYN) 2.5 MG tablet Take 2.5 mg by mouth daily. 30 minutes before Bumetanide Mon. & Thurs.    [provider]  metoprolol succinate (TOPROL-XL) 25 MG 24 hr tablet Take 12.5 mg by mouth daily.    [provider]  metoprolol succinate (TOPROL-XL) 25 MG 24 hr tablet Take 25 mg by mouth at bedtime.  12/27/19   [provider]  mirtazapine (REMERON) 15 MG tablet Take 15 mg by mouth at bedtime. Taking 1 1/2 tablet  at bedtime    [provider]  mirtazapine (REMERON) 15 MG tablet Take 15 mg by mouth at bedtime.  01/29/20   [provider]  nitroGLYCERIN (NITROLINGUAL) 0.4 MG/SPRAY spray Place 1 spray under the tongue every 5 (five) minutes x 3 doses as needed for chest pain.    [provider]  nitroGLYCERIN (NITROSTAT) 0.4 MG SL tablet Place 0.4 mg under the tongue every 5 (five) minutes as needed for chest pain.    [provider]  oxyCODONE-acetaminophen (PERCOCET/ROXICET) 5-325 MG tablet Take 1 tablet by mouth every 6 (six) hours as needed for severe pain. 07/30/19   Persons, Bevely Palmer, PA  pantoprazole (PROTONIX) 40 MG tablet Take 40 mg by mouth daily.    [provider]  potassium chloride (KLOR-CON) 20 MEQ packet Take 20 mEq by mouth 2 (two) times daily. 2 tablets in the morning and 1 at bedtime    [provider]  pramipexole (MIRAPEX) 0.25 MG tablet TAKE 1 TO 2 TABLETS AS INSTRUCTED, 1 TABLET AT 4:00 P.M. AND 2 TABLETS AT BEDTIME 09/27/19   Frann Rider, NP  pramipexole (MIRAPEX) 0.25 MG tablet Take 0.75 mg by mouth at bedtime.  12/26/19   [provider]  Resveratrol 250 MG CAPS Take 500 mg by mouth daily.    [provider]  rosuvastatin (CRESTOR) 20 MG tablet Take 20 mg by mouth daily.    [provider]  rosuvastatin (CRESTOR) 5 MG tablet Take 5 mg by mouth daily.    [provider]  sennosides-docusate sodium (SENOKOT-S) 8.6-50 MG tablet Take 1 tablet by mouth daily as needed for constipation.    [provider]  Ubiquinol (QUNOL COQ10/UBIQUINOL/MEGA) 100 MG CAPS Take 100 mg by mouth every evening. CoQ10, Qunol     [provider]    Physical Exam:  Constitutional: Elderly female appears to be in distress Vitals:   02/17/20 1107 02/17/20 1136  BP: (!) 82/48 (!) 83/49  Pulse:  99  Resp:  18  Temp: 98.1 F (36.7 C) 98.1 F (36.7 C)  TempSrc: Oral Oral  SpO2: 98% 98%   Eyes: PERRL,  lids and conjunctivae normal ENMT: Mucous membranes are dry. Posterior pharynx clear of any exudate or lesions.  Edentulous Neck: normal, supple, no masses, no thyromegaly Respiratory: Tachypneic with shallow breathing.  Heart possible crackles noted in the lower lung fields currently on 3 L of nasal cannula oxygen with O2 saturations maintained Cardiovascular: Regular rate and rhythm, no murmurs / rubs / gallops.  No carotid bruits.  Abdomen: Bowel sounds present.  Generalized abdominal tenderness appreciated Musculoskeletal: no clubbing / cyanosis.   Left arm restricted. Skin: Pallor present with bruising noted on the left hip. Neurologic: CN 2-12 grossly intact. Sensation intact, DTR normal.  Generalized weakness appreciated. Psychiatric: Normal judgment and insight.  Lethargic, but oriented x 3. Normal mood.     Labs on Admission: I have personally reviewed following labs and imaging studies  CBC: Recent Labs  Lab 02/13/20 0126 02/14/20 0101 02/15/20 0358 02/16/20 0111 02/17/20 0804  WBC 12.6* 12.4* 12.8* 13.3* 17.6*  NEUTROABS  --   --   --  10.5* 14.2*  HGB 11.1* 10.1* 8.9* 8.2* 5.4*  HCT 35.0* 31.3* 28.3* 26.2* 17.5*  MCV 89.5 88.7 90.1 90.0 90.2  PLT 181 197 243 261 436   Basic Metabolic Panel: Recent Labs  Lab 02/11/20 0623 02/12/20 0309 02/13/20 0126 02/14/20 0101 02/15/20 0358 02/16/20 0111 02/17/20 0804  NA 137   < > 136 138 140 137 137  K 4.7   < > 4.1 3.8 3.4* 3.9 4.8  CL 101   < > 103 103 106 100 103  CO2 27   < > _0 GLUCOSE 213*   < > 177* 196* 180* 185* 219*  BUN 24*   < > 40* 42* 47* 47* 88*  CREATININE 1.56*   < > 1.52* 1.45* 1.35* 1.39* 1.63*  CALCIUM 9.1   < > 8.8* 8.8* 8.5* 8.6* 8.3*  MG 2.5*  --   --  2.5*  --   --   --    < > = values in this interval not displayed.   GFR: Estimated Creatinine Clearance: 29.9 mL/min (A) (by C-G formula based on SCr of 1.63 mg/dL (H)). Liver Function Tests: Recent Labs  Lab 02/16/20 0111    AST 30  ALT 22  ALKPHOS 76  BILITOT 0.6  PROT 5.3*  ALBUMIN 2.2*   No results for input(s): LIPASE, AMYLASE in the last 168 hours. No results for input(s): AMMONIA in the last 168 hours. Coagulation Profile: No results for input(s): INR, PROTIME in the last 168 hours. Cardiac Enzymes: No results for input(s): CKTOTAL, CKMB, CKMBINDEX, TROPONINI in the last 168 hours. BNP (last 3 results) No results for input(s): PROBNP in the last 8760 hours. HbA1C: No results for input(s): HGBA1C in the last 72 hours. CBG: Recent Labs  Lab 02/16/20 0604 02/16/20 1128 02/16/20 1623 02/16/20 2105 02/17/20 0612  GLUCAP 166* 163* 168* 146* 204*   Lipid Profile: No results for input(s): CHOL, HDL, LDLCALC, TRIG, CHOLHDL, LDLDIRECT in the last 72 hours. Thyroid Function Tests: No results for input(s): TSH, T4TOTAL, FREET4, T3FREE, THYROIDAB in the last 72 hours. Anemia Panel: No results for input(s): VITAMINB12, FOLATE, FERRITIN, TIBC, IRON, RETICCTPCT in the last 72 hours. Urine analysis:    Component Value Date/Time   COLORURINE YELLOW 02/17/2020 [place] 02/17/2020 0524   LABSPEC 1.014 02/17/2020 0524   PHURINE 5.0 02/17/2020 0524   GLUCOSEU NEGATIVE 02/17/2020 0524   HGBUR NEGATIVE 02/17/2020 0524   BILIRUBINUR NEGATIVE 02/17/2020 0524   KETONESUR NEGATIVE 02/17/2020 0524   PROTEINUR NEGATIVE 02/17/2020 0524   UROBILINOGEN 0.2 10/05/2013 0059   NITRITE NEGATIVE 02/17/2020 0524   LEUKOCYTESUR NEGATIVE 02/17/2020 0524   Sepsis Labs: Recent Results (from the past 240 hour(s))  Respiratory Panel by RT PCR (Flu A&B, Covid) - Nasopharyngeal Swab     Status: None   Collection Time: 02/08/20  3:42 AM   Specimen: Nasopharyngeal Swab  Result Value Ref Range Status   SARS Coronavirus 2 by RT PCR NEGATIVE NEGATIVE Final    Comment: (NOTE) SARS-CoV-2 target nucleic acids are NOT DETECTED.  The SARS-CoV-2 RNA is  generally detectable in upper respiratoy specimens during  the acute phase of infection. The lowest concentration of SARS-CoV-2 viral copies this assay can detect is 131 copies/mL. A negative result does not preclude SARS-Cov-2 infection and should not be used as the sole basis for treatment or other patient management decisions. A negative result may occur with  improper specimen collection/handling, submission of specimen other than nasopharyngeal swab, presence of viral mutation(s) within the areas targeted by this assay, and inadequate number of viral copies (<131 copies/mL). A negative result must be combined with clinical observations, patient history, and epidemiological information. The expected result is Negative.  Fact Sheet for Patients:  PinkCheek.be  Fact Sheet for Healthcare Providers:  GravelBags.it  This test is no t yet approved or cleared by the [place] FDA and  has been authorized for detection and/or diagnosis of SARS-CoV-2 by FDA under an Emergency Use Authorization (EUA). This EUA will remain  in effect (meaning this test can be used) for the duration of the COVID-19 declaration under Section 564(b)(1) of the Act, 21 U.S.C. section 360bbb-3(b)(1), unless the authorization is terminated or revoked sooner.     Influenza A by PCR NEGATIVE NEGATIVE Final   Influenza B by PCR NEGATIVE NEGATIVE Final    Comment: (NOTE) The Xpert Xpress SARS-CoV-2/FLU/RSV assay is intended as an aid in  the diagnosis of influenza from Nasopharyngeal swab specimens and  should not be used as a sole basis for treatment. Nasal washings and  aspirates are unacceptable for Xpert Xpress SARS-CoV-2/FLU/RSV  testing.  Fact Sheet for Patients: PinkCheek.be  Fact Sheet for Healthcare Providers: GravelBags.it  This test is not yet approved or cleared by the [place] FDA and  has been authorized for detection and/or  diagnosis of SARS-CoV-2 by  FDA under an Emergency Use Authorization (EUA). This EUA will remain  in effect (meaning this test can be used) for the duration of the  Covid-19 declaration under Section 564(b)(1) of the Act, 21  U.S.C. section 360bbb-3(b)(1), unless the authorization is  terminated or revoked. Performed at [facility], [place] [address], [place], [place] [address]   Surgical pcr screen     Status: None   Collection Time: 02/08/20  5:42 PM   Specimen: Nasal Mucosa; Nasal Swab  Result Value Ref Range Status   MRSA, PCR NEGATIVE NEGATIVE Final   Staphylococcus aureus NEGATIVE NEGATIVE Final    Comment: (NOTE) The Xpert SA Assay (FDA approved for NASAL specimens in patients 34 years of age and older), is one component of a comprehensive surveillance program. It is not intended to diagnose infection nor to guide or monitor treatment. Performed at [facility], [place] [address], [place],  [address]   Culture, Urine     Status: None   Collection Time: 02/13/20  1:00 AM   Specimen: Urine, Random  Result Value Ref Range Status   Specimen Description URINE, RANDOM  Final   Special Requests NONE  Final   Culture   Final    NO GROWTH Performed at [facility], [place] [address], [place],  [address]    Report Status 02/14/2020 FINAL  Final     Radiological Exams on Admission: DG Abd 1 View  Result Date: 02/16/2020 CLINICAL DATA:  Nausea, abdominal pain EXAM: ABDOMEN - 1 VIEW COMPARISON:  None. FINDINGS: The stomach is gas filled. Nonobstructive pattern of bowel gas with scattered gas and stool present to the rectum. No free air in the abdomen on  supine radiographs. Status post left hip total arthroplasty. No radio-opaque calculi or other significant radiographic abnormality are seen. IMPRESSION: The stomach is gas filled. Nonobstructive pattern of bowel gas with scattered gas and stool present to the rectum. No free air in the abdomen on supine  radiographs. Electronically Signed   By: Eddie Candle M.D.   On: 02/16/2020 20:59    Assessment/Plan Acute blood loss anemia: Patient hemoglobin dropped down to 5.4 today.  Suspecting possible patient bleeding into left hip of recent surgery versus upper GI bleed given elevated BUN to creatinine ratio. -Admit to a progressive bed -Transfused 2 units of packed red blood cells  -Hold Plavix and aspirin -Check stool guaiac when able -Protonix IV twice daily -Check CT scan of the abdomen pelvis stat -May warrant consult to surgery, interventional radiology, or GI depending on source of bleed.  Hypotension: Acute.  Blood pressures noted to be as low as 87/43. -Goal MAP greater than 65 -Continue IV fluids and blood products -PCCM consulted due to the acuity and severity of patient's symptoms and likely need for temporary use of pressors  Acute respiratory distress: Patient normally not on oxygen at baseline but requiring 4 L of oxygen to maintain O2 saturations. -Continuous pulse oximetry with nasal cannula oxygen  Acute kidney injury superimposed on chronic kidney disease stage IV: Patient baseline creatinine previously have been around 1.3, but on recheck creatinine elevated up to 1.6 with BUN 88 to suggest prerenal cause of symptoms.  Likely related with blood loss. -Continue IV fluids as tolerated now  Leukocytosis: WBC elevated up to 17.6.  Patient currently afebrile, but white blood cell count had been trending upwards. -Continue to monitor  Urinary retention: Foley catheter was removed, but nursing staff had to in and out cath patient several times throughout the day. -Replace Place Foley cath  Left hip fracture s/p repair on 11/14. -Will need to resume PT/OT therapies once patient more stable  Subdural hematoma: Seen to have acute 4 mm subdural hematoma of the right frontal lobeafter fall in the bathtub from 11/12.  Thought to be traumatic in nature but supportive care had been  recommended at that time after being evaluated by neurosurgery and neurology.  Chronic diastolic CHF: Last EF noted to be 60 to 65% with grade 1 diastolic dysfunction last month. -Strict intake and output  Diabetes mellitus type 2 -CBGs with SSI  DVT prophylaxis: Scd Code Status: Full Family Communication: Daughter updated at bedside Disposition Plan: Hopefully discharge back to inpatient rehab once medically stable Consults called: PCCM Admission status: Inpatient   Norval Morton MD [facility] Pager [phone]   If 7PM-7AM, please contact night-coverage [email or web address] Password [email or web address]  02/17/2020, 11:01 AM

## 2020-02-17 NOTE — Progress Notes (Signed)
Occupational Therapy Session Note  Patient Details  Name: [name] MRN: [id number] Date of Birth: 05/24/1940  Today's Date: 02/17/2020 OT Individual Time:  - 60 minutes missed     Short Term Goals: Week 1:  OT Short Term Goal 1 (Week 1): Pt will complete toilet transfer with mod assist OT Short Term Goal 2 (Week 1): Pt will complete LB dressing with mod assist OT Short Term Goal 3 (Week 1): Pt will increase activity tolerance and strength by completing grooming task in standing for 1 minute with min assist OT Short Term Goal 4 (Week 1): Pt will complete bathing with mod assist  Skilled Therapeutic Interventions/Progress Updates:    Pt on medical hold at this time and therefore was not seen for scheduled OT. 60 minutes missed.  Therapy Documentation Precautions:  Precautions Precautions: Posterior Hip, Fall Precaution Comments: reviewed hip precautions Other Brace: 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 Restrictions Weight Bearing Restrictions: Yes LLE Weight Bearing: Weight bearing as tolerated Other Position/Activity Restrictions: posterior hip precautions Vital Signs: Therapy Vitals Temp: 98 F (36.7 C) Temp Source: Oral Pulse Rate: 98 Resp: 17 BP: (!) 95/50 Patient Position (if appropriate): Lying Oxygen Therapy SpO2: 100 % O2 Device: Room Air O2 Flow Rate (L/min): 1 L/min ADL: :     Therapy/Group: Individual Therapy  Damarea Merkel A Meli Faley 02/17/2020, 6:58 AM

## 2020-02-17 NOTE — Progress Notes (Signed)
PT Cancellation Note  Patient Details Name: [name] MRN: [id number] DOB: 20-Oct-1940   Cancelled Treatment:    Reason Eval/Treat Not Completed: Medical issues which prohibited therapy Per Order, MD requesting to hold PT on 11/21. Will follow up as appropriate.   Lou Miner, DPT  Acute Rehabilitation Services  Pager: [phone] Office: [phone]    Rudean Hitt 02/17/2020, 11:32 AM

## 2020-02-17 NOTE — Progress Notes (Signed)
Pt noted to have nausea with vomiting at start of shift. Reported to have hypotensive episode overnight similar to hypotensive episode on yesterday with therapy. Pt received PRN nausea medication. Per HS nurse, BP recovered to baseline.   Skin remains warm and dry at this time. Pt is alert and responsive, but reports overall not well.   MD notified face to face. New orders. See MAR: cont IVF for now, labs.

## 2020-02-17 NOTE — Progress Notes (Signed)
Patient experienced episode of hypotension to lowest number 70/40 BP after sqat pivot transfer to [place] patient transferred back to bed and placed in trendelenburg position. Blood pressure stabilized to 95/50  after transfer and intervention. Patient on 1/2 NS @ 75 ml/hr from hours of 7p-7a per order  Patient bladder scanned for 464 ml and in and out cathed for 475 ml  Clear yellow urine. urine culture and UA collected and sent to lab results still pending. Bed in lowest position with the call light in reach. Bedside fluids encouraged and safety ensured.

## 2020-02-17 NOTE — Progress Notes (Signed)
Physical Therapy Note  Patient Details  Name: [name] MRN: [id number] Date of Birth: 02/27/1941 Today's Date: 02/17/2020    Patient on hold from MD this date for medical needs. Will continue to monitor pt's ability to participate in PT once medical hold lifted.  Pt missed 60 mins PT treatment.   [name] 02/17/2020, 8:46 AM

## 2020-02-17 NOTE — Progress Notes (Signed)
Pt remains alert and responsive. Skin is warm and dry, but color does appear to dusky. VSS not stable at this time. See flowsheet. Charge RN bedside. Will notify rounding RN to come bedside.   Call has been placed for progressive bed placement. Awaiting bed at this time.   MD updated on wait time.   Blood bank will call when unit is available. New order for IVF. See MAR.  1020 Daughter is bedside at this time. Pt spouse has been updated as well.

## 2020-02-17 NOTE — Significant Event (Signed)
Rapid Response Event Note   Reason for Call :  Hypotension  Initial Focused Assessment:  Received a call from the charge RN stating that this patient is hypotensive and has dropped her Hgb.  MD is aware of the change in patient condition.  2 units of PRBC are ordered and awaiting to be transfused.  The rehab MD has also place a transfer order for the patient to go to a higher level of care.  Patient and granddaughter deny any overt signs of blood loss including GIB or hematemesis. Patient does have order for CT scan.  She stated that she did have an episode of emesis earlier today.  She states that she could have possibly aspirated. Patient has audible crackles present.       Interventions:  Vitals taken and assisted with the patient transfer to [facility] DG chest 1 view ordered  Plan of Care:  Patient will remain on [place] and be on the hospitalist service   Event Summary:   MD Notified: Rehab MD Call Time: 1018 Arrival Time: 1020 End Time: [place], RN

## 2020-02-17 NOTE — Progress Notes (Signed)
Rounding RN bedside at this time.

## 2020-02-17 NOTE — Progress Notes (Signed)
Spoke with Jackelyn Poling RN re PICC Order.  Hypotensive SBP 70's, has 2 PIV's currently working well per RN with no IV fluids or medications at this time, transferring to CT at this time.  Will reassess 02/18/20 for PICC placement.

## 2020-02-17 NOTE — Progress Notes (Signed)
  Patient with limited and poor IV access on right, left arm is restricted.  Given her hx of CKD stage IV, nephrology was called.  Spoke and discussed with Dr. Jonnie Finner who is ok with placing PICC.       Kennieth Rad, ACNP [facility] 02/17/2020, 6:43 PM

## 2020-02-17 NOTE — Plan of Care (Signed)

## 2020-02-17 NOTE — Progress Notes (Signed)
eLink Physician-Brief Progress Note Patient Name: Sundra Haddix DOB: 14-Mar-1941 MRN: [id number]   Date of Service  02/17/2020  HPI/Events of Note  Nursing request for Gerhardt's Butt Cream.  eICU Interventions  Apply Gerhardt's Butt Cream now and TID.      Intervention Category Major Interventions: Other:  [name] 02/17/2020, 10:36 PM

## 2020-02-17 NOTE — Progress Notes (Signed)
IV placed to RFA 22g

## 2020-02-17 NOTE — Progress Notes (Signed)
Lab called critical hemoglobin level: 5.4  MD notified face-to-face

## 2020-02-17 NOTE — Progress Notes (Signed)
Report called to RN Rekha on 2C10.   Pt being transferred over via bed w Rounding RN and charge RN. Daughter remains bedside.  To note: Pt has not started receiving blood just yet. Still pending unit from bank. Pt also has not received CT at this time due to acute change in condition.

## 2020-02-17 NOTE — Progress Notes (Signed)
eLink Physician-Brief Progress Note Patient Name: [name] DOB: 22-Oct-1940 MRN: [id number]   Date of Service  02/17/2020  HPI/Events of Note  Hypotension - BP = 89/45 with MAP = 58. Hgb = 5.8. Nurse waiting on 2 units PRBCs from blood bank. No CVL or CVP.  eICU Interventions  Plan: 1. Phenylephrine IV infusion.Titrate to MAP >= 65.     Intervention Category Major Interventions: Hypotension - evaluation and management  [name] 02/17/2020, 8:33 PM

## 2020-02-18 ENCOUNTER — Inpatient Hospital Stay (HOSPITAL_COMMUNITY): Payer: Medicare Other

## 2020-02-18 ENCOUNTER — Other Ambulatory Visit: Payer: Self-pay | Admitting: *Deleted

## 2020-02-18 ENCOUNTER — Inpatient Hospital Stay (HOSPITAL_COMMUNITY): Payer: Medicare Other | Admitting: Occupational Therapy

## 2020-02-18 DIAGNOSIS — R578 Other shock: Secondary | ICD-10-CM

## 2020-02-18 DIAGNOSIS — K922 Gastrointestinal hemorrhage, unspecified: Secondary | ICD-10-CM | POA: Insufficient documentation

## 2020-02-18 DIAGNOSIS — E1165 Type 2 diabetes mellitus with hyperglycemia: Secondary | ICD-10-CM | POA: Diagnosis not present

## 2020-02-18 DIAGNOSIS — E118 Type 2 diabetes mellitus with unspecified complications: Secondary | ICD-10-CM | POA: Diagnosis not present

## 2020-02-18 LAB — TYPE AND SCREEN
ABO/RH(D): A POS
Antibody Screen: POSITIVE
Donor AG Type: NEGATIVE
Donor AG Type: NEGATIVE
Donor AG Type: NEGATIVE
Donor AG Type: NEGATIVE
Donor AG Type: NEGATIVE
Donor AG Type: NEGATIVE
Unit division: 0
Unit division: 0
Unit division: 0
Unit division: 0
Unit division: 0
Unit division: 0
Unit division: 0

## 2020-02-18 LAB — PROTIME-INR
INR: 3.8 — ABNORMAL HIGH (ref 0.8–1.2)
Prothrombin Time: 36.4 seconds — ABNORMAL HIGH (ref 11.4–15.2)

## 2020-02-18 LAB — BPAM RBC
Blood Product Expiration Date: 202112092359
Blood Product Expiration Date: 202112152359
Blood Product Expiration Date: 202112162359
Blood Product Expiration Date: 202112232359
Blood Product Expiration Date: 202112242359
Blood Product Expiration Date: 202112242359
Blood Product Expiration Date: 202112252359
ISSUE DATE / TIME: 202111211125
ISSUE DATE / TIME: 202111211301
ISSUE DATE / TIME: 202111212125
ISSUE DATE / TIME: 202111212125
ISSUE DATE / TIME: 202111221507
Unit Type and Rh: 6200
Unit Type and Rh: 6200
Unit Type and Rh: 6200
Unit Type and Rh: 6200
Unit Type and Rh: 6200
Unit Type and Rh: 6200
Unit Type and Rh: 6200

## 2020-02-18 LAB — CBC
HCT: 22.5 % — ABNORMAL LOW (ref 36.0–46.0)
HCT: 20 % — ABNORMAL LOW (ref 36.0–46.0)
HCT: 14.3 % — ABNORMAL LOW (ref 36.0–46.0)
Hemoglobin: 7.7 g/dL — ABNORMAL LOW (ref 12.0–15.0)
Hemoglobin: 6.6 g/dL — CL (ref 12.0–15.0)
Hemoglobin: 4.6 g/dL — CL (ref 12.0–15.0)
MCH: 30.8 pg (ref 26.0–34.0)
MCH: 30.4 pg (ref 26.0–34.0)
MCH: 30.7 pg (ref 26.0–34.0)
MCHC: 34.2 g/dL (ref 30.0–36.0)
MCHC: 33 g/dL (ref 30.0–36.0)
MCHC: 32.2 g/dL (ref 30.0–36.0)
MCV: 90 fL (ref 80.0–100.0)
MCV: 92.2 fL (ref 80.0–100.0)
MCV: 95.3 fL (ref 80.0–100.0)
Platelets: 240 10*3/uL (ref 150–400)
Platelets: 255 10*3/uL (ref 150–400)
Platelets: 217 10*3/uL (ref 150–400)
RBC: 2.5 MIL/uL — ABNORMAL LOW (ref 3.87–5.11)
RBC: 2.17 MIL/uL — ABNORMAL LOW (ref 3.87–5.11)
RBC: 1.5 MIL/uL — ABNORMAL LOW (ref 3.87–5.11)
RDW: 14.4 % (ref 11.5–15.5)
RDW: 14.8 % (ref 11.5–15.5)
RDW: 15.5 % (ref 11.5–15.5)
WBC: 27 10*3/uL — ABNORMAL HIGH (ref 4.0–10.5)
WBC: 29.2 10*3/uL — ABNORMAL HIGH (ref 4.0–10.5)
WBC: 27.4 10*3/uL — ABNORMAL HIGH (ref 4.0–10.5)
nRBC: 1.3 % — ABNORMAL HIGH (ref 0.0–0.2)
nRBC: 2 % — ABNORMAL HIGH (ref 0.0–0.2)
nRBC: 2.5 % — ABNORMAL HIGH (ref 0.0–0.2)

## 2020-02-18 LAB — BPAM FFP
Blood Product Expiration Date: 202111242359
ISSUE DATE / TIME: 202111212125
Unit Type and Rh: 6200

## 2020-02-18 LAB — BASIC METABOLIC PANEL
Anion gap: 12 (ref 5–15)
Anion gap: 12 (ref 5–15)
BUN: 119 mg/dL — ABNORMAL HIGH (ref 8–23)
BUN: 128 mg/dL — ABNORMAL HIGH (ref 8–23)
CO2: 21 mmol/L — ABNORMAL LOW (ref 22–32)
CO2: 19 mmol/L — ABNORMAL LOW (ref 22–32)
Calcium: 8.2 mg/dL — ABNORMAL LOW (ref 8.9–10.3)
Calcium: 7.6 mg/dL — ABNORMAL LOW (ref 8.9–10.3)
Chloride: 105 mmol/L (ref 98–111)
Chloride: 105 mmol/L (ref 98–111)
Creatinine, Ser: 2.19 mg/dL — ABNORMAL HIGH (ref 0.44–1.00)
Creatinine, Ser: 2.53 mg/dL — ABNORMAL HIGH (ref 0.44–1.00)
GFR, Estimated: 22 mL/min — ABNORMAL LOW (ref >60)
GFR, Estimated: 19 mL/min — ABNORMAL LOW (ref >60)
Glucose, Bld: 209 mg/dL — ABNORMAL HIGH (ref 70–99)
Glucose, Bld: 225 mg/dL — ABNORMAL HIGH (ref 70–99)
Potassium: 4.9 mmol/L (ref 3.5–5.1)
Potassium: 4.9 mmol/L (ref 3.5–5.1)
Sodium: 138 mmol/L (ref 135–145)
Sodium: 136 mmol/L (ref 135–145)

## 2020-02-18 LAB — GLUCOSE, CAPILLARY
Glucose-Capillary: 184 mg/dL — ABNORMAL HIGH (ref 70–99)
Glucose-Capillary: 190 mg/dL — ABNORMAL HIGH (ref 70–99)
Glucose-Capillary: 247 mg/dL — ABNORMAL HIGH (ref 70–99)
Glucose-Capillary: 221 mg/dL — ABNORMAL HIGH (ref 70–99)
Glucose-Capillary: 181 mg/dL — ABNORMAL HIGH (ref 70–99)
Glucose-Capillary: 209 mg/dL — ABNORMAL HIGH (ref 70–99)

## 2020-02-18 LAB — PREPARE RBC (CROSSMATCH)

## 2020-02-18 LAB — PREPARE FRESH FROZEN PLASMA: Unit division: 0

## 2020-02-18 MED ORDER — SODIUM CHLORIDE 0.9% IV SOLUTION: Freq: Once | INTRAVENOUS | Status: AC

## 2020-02-18 MED ORDER — FENTANYL CITRATE (PF) 100 MCG/2ML IJ SOLN
INTRAMUSCULAR | Status: AC
  Administered 2020-02-18: 50 ug via INTRAVENOUS
  Filled 2020-02-18: qty 2

## 2020-02-18 MED ORDER — FENTANYL CITRATE (PF) 100 MCG/2ML IJ SOLN: 50.0000 ug | Freq: Once | INTRAMUSCULAR | Status: AC

## 2020-02-18 MED ORDER — LACTATED RINGERS IV BOLUS
250.0000 mL | Freq: Once | INTRAVENOUS | Status: AC
  Administered 2020-02-18: 250 mL via INTRAVENOUS

## 2020-02-18 MED ORDER — SODIUM CHLORIDE 0.9% IV SOLUTION: Freq: Once | INTRAVENOUS | Status: DC

## 2020-02-18 MED ORDER — LACTATED RINGERS IV BOLUS
500.0000 mL | Freq: Once | INTRAVENOUS | Status: AC
  Administered 2020-02-18: 500 mL via INTRAVENOUS

## 2020-02-18 MED ORDER — SODIUM CHLORIDE 0.9 % IV SOLN
8.0000 mg/h | INTRAVENOUS | Status: AC
  Administered 2020-02-18 – 2020-02-20 (×5): 8 mg/h via INTRAVENOUS
  Filled 2020-02-18 (×7): qty 80

## 2020-02-18 MED ORDER — NOREPINEPHRINE 16 MG/250ML-% IV SOLN
0.0000 ug/min | INTRAVENOUS | Status: DC
  Administered 2020-02-18: 5 ug/min via INTRAVENOUS
  Filled 2020-02-18: qty 250

## 2020-02-18 MED ORDER — LACTATED RINGERS IV SOLN: INTRAVENOUS | Status: DC

## 2020-02-18 MED ORDER — INSULIN DETEMIR 100 UNIT/ML ~~LOC~~ SOLN
8.0000 [IU] | Freq: Two times a day (BID) | SUBCUTANEOUS | Status: DC
  Administered 2020-02-18 – 2020-02-22 (×10): 8 [IU] via SUBCUTANEOUS
  Filled 2020-02-18 (×12): qty 0.08

## 2020-02-18 NOTE — H&P (View-Only) (Signed)
Referring Provider:  [place] Primary Care Physician:  [name], DO Primary Gastroenterologist:  Dr. Watt Climes  Reason for Consultation:  GI Bleed   HPI: [name] is a 79 y.o. female with multiple medical comorbidities including coronary artery disease, chronic kidney disease stage IV, history of breast cancer was admitted to the hospital on November 12 with mechanical fall left femoral neck fracture.  Underwent left hemiarthroplasty.  She also was found to have acute right frontal lobe subdural hematoma as well as possible subacute right frontal cortical infarct and was started on aspirin and Plavix for stroke prevention.  Patient subsequently had epigastric discomfort and one episode of nonbloody vomiting.  She was found to have drop in hemoglobin to 5.4.  GI is consulted for further evaluation.  Records reviewed.  Patient with intermittent anemia in the past.  Hemoglobin was around 11.4 on November 13.  It dropped postoperatively on November 14-8.8.  Subsequently hemoglobin improved to 11.8.  Hemoglobin dropped to 8.9 on November 19 and subsequently to 5.4 yesterday.  CT abdomen pelvis without contrast yesterday showed no acute changes.  No evidence of large hematoma.  Patient seen and examined at bedside in ICU.  Discussed with physical therapy staff at bedside.  She is having melanotic stool.  She denied seeing any blood in the stool.  Denied any history of gastric ulcers in the past.  She is complaining of fatigue and weakness.    Past Medical History:  Diagnosis Date  . Anemia    have received iron infusions  . Anxiety   . Arthritis     osteoarthritis  . Breast cancer ([place])   . Breast cancer in female [facility])    Bilateral  . CAD S/P percutaneous coronary angioplasty 2007; March 2011   [facility] 3.0 mm 20 mm postdilated 3.6 mm in early mid LAD; status post ISR Cutting Balloon PTCA and March 11 along with PCI of distal mid lesion with a 3.0 mm 12 mm MultiLink vision BMS; the  proximal stent causes jailing of SP1 and SP2 with ostial 70-80% lesions  . Cancer (HCC)    Melanoma, Squamous cell Carcinoma  . Charcot's joint of foot, right   . Charcot's joint of left foot   . Chronic diastolic CHF (congestive heart failure), NYHA class 2 ([place]) 02/28/2018  . Chronic kidney disease    stage 2   . Coronary artery disease   . Dementia ([place])    mild  . Diabetes mellitus type 2 with neurological manifestations ([place])   . Diabetes mellitus without complication ([place])   . Dyslipidemia, goal LDL below 70   . Foot pain, bilateral   . Full dentures   . Genetic testing 12/03/2016   Germline genetic testing was performed through [employer]'s Common Hereditary Cancers Panel + [facility]'s Melanoma Panel. This custom panel includes analysis of the following 51 genes: APC, ATM, AXIN2, BAP1, BARD1, BMPR1A, BRCA1, BRCA2, BRIP1, CDH1, CDK4, CDKN2A, CHEK2, CTNNA1, DICER1, EPCAM, GREM1, HOXB13, KIT, MEN1, MITF, MLH1, MSH2, MSH3, MSH6, MUTYH, NBN, NF1, NTHL1, PALB2, PDGFRA, PMS2, POLD1, POL  . GERD (gastroesophageal reflux disease)   . Gout   . Headache(784.0)   . History of hematuria    Followed by Dr. Gaynelle Arabian  . Hypertension, essential, benign   . Migraine   . Mild cognitive impairment   . Peripheral neuropathy   . Personal history of radiation therapy   . Post herpetic neuralgia   . Restless leg syndrome   . Stroke ([place])   .  TIA (transient ischemic attack)    multiple in the past  . TIA (transient ischemic attack)   . Wears glasses     Past Surgical History:  Procedure Laterality Date  . ABDOMINAL HYSTERECTOMY    . ANKLE FUSION Right 02/22/2018   Procedure: RIGHT SUBTALAR AND TALONAVICULAR FUSION;  Surgeon: Newt Minion, MD;  Location: [place];  Service: Orthopedics;  Laterality: Right;  . APPENDECTOMY    . BLADDER SUSPENSION    . BREAST LUMPECTOMY Bilateral 2018  . BREAST LUMPECTOMY WITH RADIOACTIVE SEED AND SENTINEL LYMPH NODE BIOPSY Bilateral 12/14/2016   Procedure:  BILATERAL BREAST LUMPECTOMY WITH BILATERAL  RADIOACTIVE SEED AND BILATERAL AXILLARY  SENTINEL LYMPH NODE BIOPSY ERAS PATHWAY;  Surgeon: Alphonsa Overall, MD;  Location: [place];  Service: General;  Laterality: Bilateral;  PECTORAL BLOCK  . CARDIAC CATHETERIZATION  02/24/2006   85% stenosis in the proximal portion of LAD-arrangements made for PCI on 02/25/2006  . CARDIAC CATHETERIZATION  02/25/2006   80% LAD lesion stented with a 3x79m Liberte stent resulting in reduction of 80% lesion to 0% residual  . CARDIAC CATHETERIZATION  04/26/2006   Medical management  . CARDIAC CATHETERIZATION  06/24/2009   60-70% re-stenosis in the proximal LAD. A 3.25x15 cutting balloon, 3 inflations - 14atm-38sec, 13atm-39sec, and 12atm-40sec reduced to less than 10%. 60% stenosis of the mid/distal LAD stented with a 3x164mMultilink stent.  . Marland KitchenARDIAC CATHETERIZATION  07/09/2009   Medical management  . CARDIAC CATHETERIZATION    . CAROTID DOPPLER  06/24/2009   40-59% R ICA stenosis. No significant ICA stenosis noted  . CHOLECYSTECTOMY    . CORONARY ANGIOPLASTY    . DILATION AND CURETTAGE OF UTERUS    . HIP ARTHROPLASTY Left 02/10/2020   Procedure: ARTHROPLASTY BIPOLAR HIP (HEMIARTHROPLASTY) POSTERIOR LATERAL;  Surgeon: MuRenette ButtersMD;  [place] Service: Orthopedics;  Laterality: Left;  . JOINT REPLACEMENT Left   . LESION REMOVAL Right 03/11/2015   Procedure:  EXCISION OF RIGHT PRE TIBIAL LESION;  Surgeon: JaJudeth HornMD;  [place] Service: General;  Laterality: Right;  . MASS EXCISION Left 06/10/2014   Procedure: EXCISION LEFT LOWER LEG LESION;  Surgeon: JaDoreen SalvageMD;  [place] [facility] Service: General;  Laterality: Left;  . Marland KitchenASS EXCISION Left 09/05/2015   Procedure: EXCISION OF LEFT FOREARM  MASS;  Surgeon: JaJudeth HornMD;  [place] Service: General;  Laterality: Left;  . NM MYOVIEW LTD  10/2011   No ischemia or infarction.   Normal EF.  LOW RISK. (Short bursts of 5 beats NSVT during recovery otherwise normal)  . SHOULDER ARTHROSCOPY  10/15  . SHOULDER ARTHROSCOPY  1995   left  . TEE WITHOUT CARDIOVERSION N/A 08/03/2017   Procedure: TRANSESOPHAGEAL ECHOCARDIOGRAM (TEE);  Surgeon: RaSkeet LatchMD;  [place] [facility] Service: Cardiovascular;; EF 60-65% with no wall motion normalities.  No atrial thrombus noted.  Lightly findings consistent with a lipomatous hypertrophy of the atrial septum.   . TENDON REPAIR Left 05/12/2016   Procedure: Left Anterior Tibial Tendon Reconstruction;  Surgeon: MaNewt MinionMD;  [place] Service: Orthopedics;  Laterality: Left;  . TOTAL KNEE ARTHROPLASTY  2005   left  . TRANSESOPHAGEAL ECHOCARDIOGRAM  08/03/2016   : EF 60-65% no wall motion normalities.  No atrial thrombus.  Lipomatous hypertrophy of the atrial septum.  No mass noted.  . TRANSESOPHAGEAL ECHOCARDIOGRAM  07/2017   EF 60  to 65%.  No R WMA.  No atrial thrombus noted.  Lipomatous IAS.  Marland Kitchen TRANSTHORACIC ECHOCARDIOGRAM  02/25/2017   :EF 65-70%.  GR 1 DD.  No or WMA.  Moderate aortic calcification/sclerosis but no stenosis.  . TRANSTHORACIC ECHOCARDIOGRAM  07/2017   Vigorous LV function.  EF 65-70%.  Mild diastolic dysfunction with no RWMA. GR 1 DD.  Mild aortic sclerosis/no stenosis.  Questionable right atrial mass discounted with TEE)   . WRIST ARTHROPLASTY  2010   cancer lt wrist    Prior to Admission medications   Medication Sig Start Date End Date Taking? Authorizing Provider  bethanechol (URECHOLINE) 25 MG tablet Take 1 tablet (25 mg total) by mouth 3 (three) times daily. 02/15/20 03/16/20  Para Skeans, MD  BIOTIN PO Take 1 tablet by mouth daily.    [provider]  bumetanide (BUMEX) 2 MG tablet Take 2 mg by mouth daily. 2 in morning and 1 in afternoon    [provider]  Calcium Carb-Cholecalciferol (CALCIUM 600+D3) 600-800 MG-UNIT TABS Take 1 tablet by mouth daily.    [provider]  Cholecalciferol (VITAMIN D) 50 MCG (2000 UT) tablet Take 2,000 Units by mouth daily.    [provider]  clopidogrel (PLAVIX) 75 MG tablet TAKE 1 TABLET DAILY 04/15/19   Leonie Man, MD  clopidogrel (PLAVIX) 75 MG tablet Take 75 mg by mouth daily. 01/10/20   [provider]  clopidogrel (PLAVIX) 75 MG tablet Take 1 tablet (75 mg total) by mouth daily. 02/16/20 03/17/20  Para Skeans, MD  Coenzyme Q10 (COQ-10 PO) Take 1 tablet by mouth daily.    [provider]  colchicine 0.6 MG tablet Take 0.6 mg by mouth daily as needed (for up to 3 days for gout flare).    [provider]  COLCRYS 0.6 MG tablet TAKE 1 TABLET DAILY AS NEEDED FOR GOUT FLARE. 12/27/18   Newt Minion, MD  Cranberry 500 MG TABS Take 500 mg by mouth every evening.    [provider]  CRANBERRY-CALCIUM PO Take 1 tablet by mouth daily.    [provider]  diazepam (VALIUM) 5 MG tablet Take 1 tablet (5 mg total) by mouth 2 (two) times daily as needed for anxiety. 03/02/18   Dessa Phi, DO  diazepam (VALIUM) 5 MG tablet Take 5 mg by mouth 3 (three) times daily as needed. 10/05/19   [provider]  exemestane (AROMASIN) 25 MG tablet TAKE 1 TABLET DAILY AFTER BREAKFAST 03/12/19   Truitt Merle, MD  febuxostat (ULORIC) 40 MG tablet TAKE 1 TABLET DAILY 10/02/19   Newt Minion, MD  febuxostat (ULORIC) 40 MG tablet Take 40 mg by mouth daily. 12/31/19   [provider]  feeding supplement, GLUCERNA SHAKE, (GLUCERNA SHAKE) LIQD Take 237 mLs by mouth 3 (three) times daily between meals for 7 days. 02/15/20 02/22/20  Para Skeans, MD  gabapentin (NEURONTIN) 300 MG capsule Take 1 capsule (300 mg total) by mouth 2 (two) times daily. Take 1 during the day if needed and 2 at night 04/06/18   Dennie Bible, NP  gabapentin (NEURONTIN) 300 MG capsule Take 300 mg by mouth 2 (two) times daily.  12/18/19   [provider]  JANUVIA 25 MG tablet Take 25 mg  by mouth daily. 12/26/19   [provider]  JANUVIA 25 MG tablet Take 25 mg by mouth daily. 12/26/19   [provider]  Astrid Drafts 500 MG CAPS  Take 500 mg by mouth daily.    [provider]  KRILL OIL PO Take 1 capsule by mouth daily.    [provider]  Methylfol-Methylcob-Acetylcyst (CEREFOLIN NAC) 6-2-600 MG TABS TAKE 1 TABLET BY MOUTH EVERY DAY 06/07/19   Garvin Fila, MD  Methylfol-Methylcob-Acetylcyst (CEREFOLIN NAC) 6-2-600 MG TABS Take 1 tablet by mouth in the morning.    [provider]  metolazone (ZAROXOLYN) 2.5 MG tablet TAKE 1 TABLET DAILY 30 MINUTES BEFORE THE MORNING DOSE OF BUMEX AS DIRECTED 08/09/18   Leonie Man, MD  metolazone (ZAROXOLYN) 2.5 MG tablet Take 2.5 mg by mouth daily. 30 minutes before Bumetanide Mon. & Thurs.    [provider]  metoprolol succinate (TOPROL-XL) 25 MG 24 hr tablet Take 12.5 mg by mouth daily.    [provider]  metoprolol succinate (TOPROL-XL) 25 MG 24 hr tablet Take 25 mg by mouth at bedtime.  12/27/19   [provider]  mirtazapine (REMERON) 15 MG tablet Take 15 mg by mouth at bedtime. Taking 1 1/2 tablet at bedtime    [provider]  mirtazapine (REMERON) 15 MG tablet Take 15 mg by mouth at bedtime.  01/29/20   [provider]  nitroGLYCERIN (NITROLINGUAL) 0.4 MG/SPRAY spray Place 1 spray under the tongue every 5 (five) minutes x 3 doses as needed for chest pain.    [provider]  nitroGLYCERIN (NITROSTAT) 0.4 MG SL tablet Place 0.4 mg under the tongue every 5 (five) minutes as needed for chest pain.    [provider]  oxyCODONE-acetaminophen (PERCOCET/ROXICET) 5-325 MG tablet Take 1 tablet by mouth every 6 (six) hours as needed for severe pain. 07/30/19   Persons, Bevely Palmer, PA  pantoprazole (PROTONIX) 40 MG tablet Take 40 mg by mouth daily.    [provider]  potassium chloride (KLOR-CON) 20 MEQ packet Take 20 mEq by mouth 2  (two) times daily. 2 tablets in the morning and 1 at bedtime    [provider]  pramipexole (MIRAPEX) 0.25 MG tablet TAKE 1 TO 2 TABLETS AS INSTRUCTED, 1 TABLET AT 4:00 P.M. AND 2 TABLETS AT BEDTIME 09/27/19   Frann Rider, NP  pramipexole (MIRAPEX) 0.25 MG tablet Take 0.75 mg by mouth at bedtime.  12/26/19   [provider]  Resveratrol 250 MG CAPS Take 500 mg by mouth daily.    [provider]  rosuvastatin (CRESTOR) 20 MG tablet Take 20 mg by mouth daily.    [provider]  rosuvastatin (CRESTOR) 5 MG tablet Take 5 mg by mouth daily.    [provider]  sennosides-docusate sodium (SENOKOT-S) 8.6-50 MG tablet Take 1 tablet by mouth daily as needed for constipation.    [provider]  Ubiquinol (QUNOL COQ10/UBIQUINOL/MEGA) 100 MG CAPS Take 100 mg by mouth every evening. CoQ10, Qunol     [provider]    Scheduled Meds: . Cerefolin NAC  1 tablet Oral q AM  . Chlorhexidine Gluconate Cloth  6 each Topical Daily  . exemestane  25 mg Oral QPC breakfast  . febuxostat  40 mg Oral Daily  . feeding supplement (GLUCERNA SHAKE)  237 mL Oral TID WC  . Gerhardt's butt cream  1 application Topical TID  . insulin aspart  0-15 Units Subcutaneous Q4H  . insulin detemir  8 Units Subcutaneous BID  . linagliptin  5 mg Oral Daily  . mirtazapine  15 mg Oral QHS  . multivitamin with minerals  1 tablet Oral  Daily  . pantoprazole (PROTONIX) IV  40 mg Intravenous Q12H  . rosuvastatin  20 mg Oral Daily  . senna-docusate  2 tablet Oral BID   Continuous Infusions: . sodium chloride    . lactated ringers     PRN Meds:.acetaminophen, alum & mag hydroxide-simeth, bisacodyl, guaiFENesin-dextromethorphan, lidocaine, menthol-cetylpyridinium **OR** phenol, methocarbamol, nitroGLYCERIN, ondansetron (ZOFRAN) IV, polyethylene glycol, prochlorperazine **OR** prochlorperazine **OR** prochlorperazine, sodium phosphate, traZODone  Allergies as of 02/17/2020 -  Review Complete 02/17/2020  Allergen Reaction Noted  . Nsaids Nausea And Vomiting and Palpitations 02/15/2020  . Reglan [metoclopramide] Other (See Comments) 02/15/2020  . Tramadol Other (See Comments) 02/15/2020  . Ultram [tramadol] Other (See Comments) 10/04/2013  . Lipitor [atorvastatin] Other (See Comments) 12/26/2012  . Lipitor [atorvastatin] Swelling and Other (See Comments) 02/15/2020  . Lyrica [pregabalin] Other (See Comments) 03/03/2017  . Lyrica [pregabalin] Swelling 02/15/2020  . Reglan [metoclopramide] Other (See Comments) 10/04/2013  . Nsaids Swelling 09/18/2012  . Urecholine [bethanechol]  02/15/2020    Family History  Problem Relation Age of Onset  . Diabetes Mother   . Kidney disease Mother   . Diabetes Sister   . Diabetes Brother   . Hypertension Mother   . Hypertension Father   . Emphysema Father   . Heart attack Father   . Heart disease Father   . Arthritis Sister   . Diabetes Sister   . Hypertension Sister   . Breast cancer Sister 27       treated with mastectomy  . Cervical cancer Sister 60  . Hypertension Brother   . Hyperlipidemia Brother   . Diabetes Brother   . Stroke Brother   . Diabetes Sister   . Hypertension Sister   . Stomach cancer Sister 25  . Hypertension Brother   . ALS Brother        d.62s  . Breast cancer Daughter 62       triple negative treated with lumpectomy, chemo, radiation  . Heart attack Daughter 45       d.45  . COPD Daughter   . Cancer Paternal Aunt        unspecified type  . Cancer Paternal Uncle        unspecified type  . Cancer Paternal Uncle        unspecified type    Social History   Socioeconomic History  . Marital status: Married    Spouse name: [name]  . Number of children: 3  . Years of education: 81  . Highest education level: Not on file  Occupational History  . Occupation: retired    Fish farm manager: RETIRED  Tobacco Use  . Smoking status: Never Smoker  . Smokeless tobacco: Never Used  Vaping Use   . Vaping Use: Never used  Substance and Sexual Activity  . Alcohol use: Never  . Drug use: Never  . Sexual activity: Never  Other Topics Concern  . Not on file  Social History Narrative   ** Merged History Encounter **       She is a married mother of 10, grandmother of 2.  Usually very active and out about the house.  But currently over last 6-8 weeks has been incapacitated.  She never smoked and does not drink alcohol. 10 th grade   Social Determinants of Health   Financial Resource Strain:   . Difficulty of Paying Living Expenses: Not on file  Food Insecurity: No Food Insecurity  . Worried About Charity fundraiser in the Last Year: Never  true  . Ran Out of Food in the Last Year: Never true  Transportation Needs: No Transportation Needs  . Lack of Transportation (Medical): No  . Lack of Transportation (Non-Medical): No  Physical Activity:   . Days of Exercise per Week: Not on file  . Minutes of Exercise per Session: Not on file  Stress:   . Feeling of Stress : Not on file  Social Connections:   . Frequency of Communication with Friends and Family: Not on file  . Frequency of Social Gatherings with Friends and Family: Not on file  . Attends Religious Services: Not on file  . Active Member of Clubs or Organizations: Not on file  . Attends Archivist Meetings: Not on file  . Marital Status: Not on file  Intimate Partner Violence:   . Fear of Current or Ex-Partner: Not on file  . Emotionally Abused: Not on file  . Physically Abused: Not on file  . Sexually Abused: Not on file    Review of Systems: All negative except as stated above in HPI.  Physical Exam: Vital signs: Vitals:   02/18/20 0700 02/18/20 0724  BP: (!) 135/41   Pulse: (!) 104   Resp: (!) 25   Temp:  97.7 F (36.5 C)  SpO2: 100%    Last BM Date: 02/18/20 General: Elderly patient, in mild distress Lungs:  Clear throughout to auscultation.   No wheezes, crackles, or rhonchi. No acute  distress. Abdomen.  Epigastric discomfort on palpation, abdomen is distended, bowel sounds present, no peritoneal signs Heart:  Regular rate and rhythm; no murmurs, clicks, rubs,  or gallops. Neuro.  Alert/oriented x3 Psych, anxious Lower extremity.  Trace edema Rectal:  Deferred  GI:  Lab Results: Recent Labs    02/16/20 0111 02/16/20 0111 02/17/20 0804 02/17/20 1804 02/18/20 0404  WBC 13.3*  --  17.6*  --  27.0*  HGB 8.2*   < > 5.4* 5.9* 7.7*  HCT 26.2*   < > 17.5* 18.6* 22.5*  PLT 261  --  326  --  240   < > = values in this interval not displayed.   BMET Recent Labs    02/16/20 0111 02/17/20 0804 02/18/20 0404  NA 137 137 138  K 3.9 4.8 4.9  CL 100 103 105  CO2 29 23 21*  GLUCOSE 185* 219* 209*  BUN 47* 88* 119*  CREATININE 1.39* 1.63* 2.19*  CALCIUM 8.6* 8.3* 8.2*   LFT Recent Labs    02/16/20 0111  PROT 5.3*  ALBUMIN 2.2*  AST 30  ALT 22  ALKPHOS 76  BILITOT 0.6   PT/INR No results for input(s): LABPROT, INR in the last 72 hours.   Studies/Results: CT ABDOMEN PELVIS WO CONTRAST  Result Date: 02/17/2020 CLINICAL DATA:  Nausea and vomiting with drop in hemoglobin. Recent hip surgery. EXAM: CT ABDOMEN AND PELVIS WITHOUT CONTRAST TECHNIQUE: Multidetector CT imaging of the abdomen and pelvis was performed following the standard protocol without IV contrast. COMPARISON:  CT dated 11/16/2015 FINDINGS: Lower chest: There is a partially visualized pulmonary nodule in the left lower lobe measuring approximately 5 mm (axial series 4, image 1).The intracardiac blood pool is hypodense relative to the adjacent myocardium consistent with anemia. The heart size is normal. There are coronary artery calcifications. Hepatobiliary: The liver is normal. Status post cholecystectomy.There is no biliary ductal dilation. Pancreas: Normal contours without ductal dilatation. No peripancreatic fluid collection. Spleen: Unremarkable. Adrenals/Urinary Tract: --Adrenal glands: There  is a stable left adrenal  mass, consistent with a benign adrenal adenoma. --Right kidney/ureter: No hydronephrosis or radiopaque kidney stones. --Left kidney/ureter: No hydronephrosis or radiopaque kidney stones. --Urinary bladder: There is a tiny focus of gas within the urinary bladder, likely representing sequela of recent instrumentation. Stomach/Bowel: --Stomach/Duodenum: No hiatal hernia or other gastric abnormality. Normal duodenal course and caliber. --Small bowel: Unremarkable. --Colon: There is an above average amount of stool in the colon. There are few scattered colonic diverticula without CT evidence for diverticulitis. --Appendix: Normal. Vascular/Lymphatic: Atherosclerotic calcification is present within the non-aneurysmal abdominal aorta, without hemodynamically significant stenosis. --No retroperitoneal lymphadenopathy. --No mesenteric lymphadenopathy. --No pelvic or inguinal lymphadenopathy. Reproductive: The pelvis is suboptimally evaluated secondary to streak artifact. However, the patient appears to be status post prior hysterectomy. Other: No ascites or free air. The abdominal wall is normal. Musculoskeletal. There are expected postsurgical changes related to recent total hip arthroplasty on the left. There is no large adjacent hematoma. The hardware appears grossly intact where visualized. IMPRESSION: 1. No acute intra-abdominal abnormality detected. No specific abnormality detected to explain the patient's anemia. 2. Expected postsurgical changes related to recent left total hip arthroplasty. 3. There is a partially visualized 5 mm pulmonary nodule in the left lower lobe. In a low risk patient, no further follow-up is required. No follow-up needed if patient is low-risk. Non-contrast chest CT can be considered in 12 months if patient is high-risk. This recommendation follows the consensus statement: Guidelines for Management of Incidental Pulmonary Nodules Detected on CT Images: From the  Fleischner Society 2017; Radiology 2017; 284:228-243. 4. Anemia. Aortic Atherosclerosis (ICD10-I70.0). Electronically Signed   By: Constance Holster M.D.   On: 02/17/2020 19:27   DG Abd 1 View  Result Date: 02/16/2020 CLINICAL DATA:  Nausea, abdominal pain EXAM: ABDOMEN - 1 VIEW COMPARISON:  None. FINDINGS: The stomach is gas filled. Nonobstructive pattern of bowel gas with scattered gas and stool present to the rectum. No free air in the abdomen on supine radiographs. Status post left hip total arthroplasty. No radio-opaque calculi or other significant radiographic abnormality are seen. IMPRESSION: The stomach is gas filled. Nonobstructive pattern of bowel gas with scattered gas and stool present to the rectum. No free air in the abdomen on supine radiographs. Electronically Signed   By: Eddie Candle M.D.   On: 02/16/2020 20:59   DG Chest Port 1 View  Result Date: 02/17/2020 CLINICAL DATA:  Respiratory distress.  Hypertension. EXAM: PORTABLE CHEST 1 VIEW COMPARISON:  None. FINDINGS: Stable cardiomegaly. The hila and mediastinum are unchanged. The lungs are clear. No other acute abnormalities. IMPRESSION: Stable cardiomegaly. No focal infiltrates or pulmonary abnormalities identified. Electronically Signed   By: Dorise Bullion III M.D   On: 02/17/2020 12:26   [place] EKG SITE RITE  Result Date: 02/17/2020 If Site Rite image not attached, placement could not be confirmed due to current cardiac rhythm.   Impression/Plan: -Epigastric pain with melena and acute blood loss anemia.  Need to rule out ulcer disease. -Acute blood loss anemia.  Hemoglobin improved to 7.7 after blood transfusion. -History of coronary artery disease -Subacute CVA.  Aspirin and Plavix currently on hold.  Recommendations ---------------------- -Tentatively plan for EGD this afternoon depending on availability. -Change IV twice daily PPI to Protonix drip -Monitor H&H.  Transfuse if needed to keep hemoglobin around  7-8. -Continue to hold aspirin and Plavix for now  Risks (bleeding, infection, bowel perforation that could require surgery, sedation-related changes in cardiopulmonary systems), benefits (identification and possible treatment of source of symptoms,  exclusion of certain causes of symptoms), and alternatives (watchful waiting, radiographic imaging studies, empiric medical treatment)  were explained to patient in detail and patient wishes to proceed.   LOS: 1 day   Otis Brace  MD, FACP 02/18/2020, 9:51 AM  Contact #  [phone]

## 2020-02-18 NOTE — Patient Outreach (Signed)
[facility]) Care Management  02/18/2020  [name] 1940-10-27 [id number]   [place] closure. Patient has been admitted to hospital. Patient will be followed by Complex Care Coordinator once discharged.  Plan: RN Health Coach Discipline Closure Discipline Closure letter sent to PCP  [facility] Management [phone]

## 2020-02-18 NOTE — Evaluation (Signed)
Occupational Therapy Evaluation Patient Details Name: [name] MRN: [id number] DOB: 25-Sep-1940 Today's Date: 02/18/2020    History of Present Illness Pt is an 79 yo female admitted after a fall after getting out of shower with resulting L femoral neck fx 11/12. Pt underwent L posterior lateral THR with posterior hip precautions 11/14 and is WBAT.  Pt also found to have possible subacute infarct to R frontal lobe.  Neuorsurgery not sure it is a true hematoma and has no follow up. Pt did hit back of head in fall as seen with abrasion. 11/21 pt had a hypotensive crisis and hemoglobin drop and was transferred to ICU. 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 Pt has also had TKR.   Clinical Impression   Pt received in bed upon arrival, agreeable to therapy session. Pt on bed pan, required maxA+2 for rolling, totalA for posterior/pericare. Pt had black stool, RN aware. Pt required maxA+2 to progress to EOB, tolerated sitting EOB about 6 minutes with stable vitals, but reporting dizziness and feeling like she was going to pass out. Pt stood x1 with totalA+2. Pt demonstrated limitations with cognition, strength, stability, activity tolerance limiting her safety and independence with ADL/IADL and functional mobility. Currently recommend SNF for follow-up therapy. Do not feel pt is appropriate for CIR level therapies at this time. Will continue to follow acutely and progress appropriately.   Supine BP 109/13mmHg (64) Sitting 110/84mmHg (98) Sitting 3 min 118/35mmHg (87) HR 107-111 throughout    Follow Up Recommendations  SNF;Supervision/Assistance - 24 hour    Equipment Recommendations       Recommendations for Other Services       Precautions / Restrictions Precautions Precautions: Posterior Hip;Fall Precaution Booklet Issued: Yes (comment) Precaution Comments: reviewed hip precautions with handout provided Required  Braces or Orthoses: Other Brace Other Brace: Pt has foot braces due to charcot which are currently at home Restrictions Weight Bearing Restrictions: Yes LLE Weight Bearing: Weight bearing as tolerated Other Position/Activity Restrictions: posterior hip precautions      Mobility Bed Mobility Overal bed mobility: Needs Assistance Bed Mobility: Supine to Sit;Rolling Rolling: Max assist;+2 for physical assistance;+2 for safety/equipment   Supine to sit: +2 for physical assistance;Max assist Sit to supine: Total assist;+2 for physical assistance   General bed mobility comments: max +2 assist to roll bil for pericare with billow between knees. supine to sit with HOB 30 degrees with use of pad to pivot pelvis, assist to move legs to EOB and cues for hands on rail to assist trunk. Return to supine with total +2 assist to control trunk and pivot legs to surface    Transfers Overall transfer level: Needs assistance Equipment used: Rolling walker (2 wheeled) Transfers: Sit to/from Stand Sit to Stand: Total assist;+2 physical assistance         General transfer comment: total +2 assist with bil knees blocked, use of pad and belt to rise with pt not engaging quads or assisting rise with elevated bed    Balance Overall balance assessment: Needs assistance Sitting-balance support: Feet supported;Bilateral upper extremity supported Sitting balance-Leahy Scale: Poor Sitting balance - Comments: tolerated sitting EOB grossly 6 min with min assist   Standing balance support: During functional activity Standing balance-Leahy Scale: Zero Standing balance comment: Requires UE support in standing with flexed posture.  ADL either performed or assessed with clinical judgement   ADL Overall ADL's : Needs assistance/impaired Eating/Feeding: Set up;Sitting;Cueing for safety Eating/Feeding Details (indicate cue type and reason): pt observed drinking after  setup Grooming: Set up;Sitting;Bed level   Upper Body Bathing: Minimal assistance;Bed level   Lower Body Bathing: Total assistance;+2 for physical assistance;Sitting/lateral leans;Adhering to hip precautions Lower Body Bathing Details (indicate cue type and reason): Pt with posterior hip precuations that she does not yet understand.  Unable to get into complete standing due to pain Upper Body Dressing : Minimal assistance;Sitting;Cueing for compensatory techniques   Lower Body Dressing: Total assistance;+2 for physical assistance;Adhering to hip precautions;Sit to/from stand;Cueing for compensatory techniques Lower Body Dressing Details (indicate cue type and reason): will benefit from AE   Toilet Transfer Details (indicate cue type and reason): attempted to stand to progress to toilet transfer, pt required totalA+2 to stand Toileting- Clothing Manipulation and Hygiene: Total assistance;+2 for physical assistance;Adhering to hip precautions;Sitting/lateral lean       Functional mobility during ADLs: Maximal assistance;+2 for physical assistance;+2 for safety/equipment General ADL Comments: pt reporting dizziness sitting EOB, vitals all stable sitting EOB, pt apepars to have fear of mobility     Vision Baseline Vision/History: No visual deficits Wears Glasses: At all times Patient Visual Report: No change from baseline Vision Assessment?: No apparent visual deficits     Perception Perception Perception Tested?: No   Praxis      Pertinent Vitals/Pain Pain Assessment: Faces Faces Pain Scale: Hurts little more Pain Location: Lft hip with movement Pain Descriptors / Indicators: Aching;Discomfort;Grimacing;Sore;Guarding Pain Intervention(s): Limited activity within patient's tolerance;Monitored during session;Repositioned     Hand Dominance Right   Extremity/Trunk Assessment Upper Extremity Assessment Upper Extremity Assessment: Generalized weakness LUE Deficits / Details:  shoulder flexion about 90*, strength shoulder 3/5, grip4/5 bicep/tricep 4/5 LUE Sensation: WNL LUE Coordination: decreased gross motor   Lower Extremity Assessment Lower Extremity Assessment: Defer to PT evaluation RLE Deficits / Details: generalized weakness with strength grossly 2/5 with assist for all movement and slow to slide RLE toward EOB LLE Deficits / Details: physical assist for all movement of LLE with strength 2-/5 with limited ROM due to precautions and pain   Cervical / Trunk Assessment Cervical / Trunk Assessment: Kyphotic   Communication Communication Communication: No difficulties   Cognition Arousal/Alertness: Awake/alert Behavior During Therapy: Anxious Overall Cognitive Status: Impaired/Different from baseline Area of Impairment: Following commands;Safety/judgement;Problem solving;Attention;Memory                   Current Attention Level: Sustained Memory: Decreased short-term memory Following Commands: Follows one step commands inconsistently;Follows one step commands with increased time Safety/Judgement: Decreased awareness of deficits;Decreased awareness of safety   Problem Solving: Slow processing;Requires verbal cues;Requires tactile cues General Comments: pt with anxiety with mobility and reports "blacking out" despite stable vital signs with mobility. Pt with increased time to follow commands along with physical assist. Pt unable to state precautions and educated for all   General Comments  Vitals within normal range    Exercises     Shoulder Instructions      Home Living Family/patient expects to be discharged to:: Private residence Living Arrangements: Spouse/significant other Available Help at Discharge: Available 24 hours/day Type of Home: House Home Access: Stairs to enter [employer] of Steps: 4 Entrance Stairs-Rails: [place]: One level         Biochemist, clinical: Standard Bathroom Accessibility: Yes    Home Equipment: [place] - 4 wheels;Wheelchair -  manual   Additional Comments: Pt lives in ones story home with 3 steps to enter, has both tub-shower and walk in shower with built-in seats and grab bars. Has a RW and Cane  Lives With: Spouse    Prior Functioning/Environment Level of Independence: Independent with assistive device(s)        Comments: Pt could not drive but was able to do most other adls other than cooking bc her husband does this. Pt reports use of cane or furniture as RW does not fit in her house.        OT Problem List: Decreased strength;Decreased range of motion;Decreased activity tolerance;Impaired balance (sitting and/or standing);Decreased knowledge of use of DME or AE;Decreased knowledge of precautions;Impaired sensation;Impaired UE functional use;Pain;Increased edema      OT Treatment/Interventions: Self-care/ADL training;Therapeutic activities;DME and/or AE instruction;Balance training;Patient/family education;Cognitive remediation/compensation    OT Goals(Current goals can be found in the care plan section) Acute Rehab OT Goals Patient Stated Goal: to go home OT Goal Formulation: With patient Time For Goal Achievement: 03/03/20 Potential to Achieve Goals: Good ADL Goals Pt Will Perform Grooming: with min assist;standing Pt Will Perform Lower Body Dressing: with min assist;with adaptive equipment;sit to/from stand Pt Will Transfer to Toilet: with min assist;stand pivot transfer;bedside commode Additional ADL Goal #1: Pt will complete bed mobility with minimal assistance in preparation for ADL/IADL.  OT Frequency: Min 3X/week   Barriers to D/C: Decreased caregiver support          Co-evaluation PT/OT/SLP Co-Evaluation/Treatment: Yes Reason for Co-Treatment: Complexity of the patient's impairments (multi-system involvement);For patient/therapist safety;To address functional/ADL transfers PT goals addressed during session: Mobility/safety with  mobility;Strengthening/ROM OT goals addressed during session: ADL's and self-care      AM-PAC OT "6 Clicks" Daily Activity     Outcome Measure Help from another person eating meals?: A Little Help from another person taking care of personal grooming?: A Little Help from another person toileting, which includes using toliet, bedpan, or urinal?: A Lot Help from another person bathing (including washing, rinsing, drying)?: A Lot Help from another person to put on and taking off regular upper body clothing?: A Lot Help from another person to put on and taking off regular lower body clothing?: Total 6 Click Score: 13   End of Session Equipment Utilized During Treatment: Oxygen;Gait belt Nurse Communication: Mobility status  Activity Tolerance: Patient tolerated treatment well;Patient limited by lethargy Patient left: in bed;with call bell/phone within reach;with bed alarm set  OT Visit Diagnosis: Unsteadiness on feet (R26.81);Other abnormalities of gait and mobility (R26.89);Pain;Muscle weakness (generalized) (M62.81) Pain - Right/Left: Left Pain - part of body: Hip                Time: 2536-6440 OT Time Calculation (min): 33 min Charges:  OT General Charges $OT Visit: 1 Visit OT Evaluation $OT Eval Moderate Complexity: Roslyn OTR/L Acute Rehabilitation Services Office: [place] 02/18/2020, 11:52 AM

## 2020-02-18 NOTE — Evaluation (Signed)
Clinical/Bedside Swallow Evaluation Patient Details  Name: Tayvia Faughnan MRN: [id number] Date of Birth: 07/10/1940  Today's Date: 02/18/2020 Time: SLP Start Time (ACUTE ONLY): 0845 SLP Stop Time (ACUTE ONLY): 0857 SLP Time Calculation (min) (ACUTE ONLY): 12 min  Past Medical History:  Past Medical History:  Diagnosis Date  . Anemia    have received iron infusions  . Anxiety   . Arthritis     osteoarthritis  . Breast cancer ([place])   . Breast cancer in female [facility])    Bilateral  . CAD S/P percutaneous coronary angioplasty 2007; March 2011   [facility] 3.0 mm 20 mm postdilated 3.6 mm in early mid LAD; status post ISR Cutting Balloon PTCA and March 11 along with PCI of distal mid lesion with a 3.0 mm 12 mm MultiLink vision BMS; the proximal stent causes jailing of SP1 and SP2 with ostial 70-80% lesions  . Cancer (HCC)    Melanoma, Squamous cell Carcinoma  . Charcot's joint of foot, right   . Charcot's joint of left foot   . Chronic diastolic CHF (congestive heart failure), NYHA class 2 ([place]) 02/28/2018  . Chronic kidney disease    stage 2   . Coronary artery disease   . Dementia ([place])    mild  . Diabetes mellitus type 2 with neurological manifestations ([place])   . Diabetes mellitus without complication ([place])   . Dyslipidemia, goal LDL below 70   . Foot pain, bilateral   . Full dentures   . Genetic testing 12/03/2016   Germline genetic testing was performed through [facility] Common Hereditary Cancers Panel + [facility] Melanoma Panel. This custom panel includes analysis of the following 51 genes: APC, ATM, AXIN2, BAP1, BARD1, BMPR1A, BRCA1, BRCA2, BRIP1, CDH1, CDK4, CDKN2A, CHEK2, CTNNA1, DICER1, EPCAM, GREM1, HOXB13, KIT, MEN1, MITF, MLH1, MSH2, MSH3, MSH6, MUTYH, NBN, NF1, NTHL1, PALB2, PDGFRA, PMS2, POLD1, POL  . GERD (gastroesophageal reflux disease)   . Gout   . Headache(784.0)   . History of hematuria    Followed by Dr. Gaynelle Arabian  . Hypertension, essential, benign   .  Migraine   . Mild cognitive impairment   . Peripheral neuropathy   . Personal history of radiation therapy   . Post herpetic neuralgia   . Restless leg syndrome   . Stroke ([place])   . TIA (transient ischemic attack)    multiple in the past  . TIA (transient ischemic attack)   . Wears glasses    Past Surgical History:  Past Surgical History:  Procedure Laterality Date  . ABDOMINAL HYSTERECTOMY    . ANKLE FUSION Right 02/22/2018   Procedure: RIGHT SUBTALAR AND TALONAVICULAR FUSION;  Surgeon: Newt Minion, MD;  Location: [address];  Service: Orthopedics;  Laterality: Right;  . APPENDECTOMY    . BLADDER SUSPENSION    . BREAST LUMPECTOMY Bilateral 2018  . BREAST LUMPECTOMY WITH RADIOACTIVE SEED AND SENTINEL LYMPH NODE BIOPSY Bilateral 12/14/2016   Procedure: BILATERAL BREAST LUMPECTOMY WITH BILATERAL  RADIOACTIVE SEED AND BILATERAL AXILLARY  SENTINEL LYMPH NODE BIOPSY ERAS PATHWAY;  Surgeon: Alphonsa Overall, MD;  Location: [place];  Service: General;  Laterality: Bilateral;  PECTORAL BLOCK  . CARDIAC CATHETERIZATION  02/24/2006   85% stenosis in the proximal portion of LAD-arrangements made for PCI on 02/25/2006  . CARDIAC CATHETERIZATION  02/25/2006   80% LAD lesion stented with a 3x69m Liberte stent resulting in reduction of 80% lesion to 0% residual  . CARDIAC CATHETERIZATION  04/26/2006   Medical management  .  CARDIAC CATHETERIZATION  06/24/2009   60-70% re-stenosis in the proximal LAD. A 3.25x15 cutting balloon, 3 inflations - 14atm-38sec, 13atm-39sec, and 12atm-40sec reduced to less than 10%. 60% stenosis of the mid/distal LAD stented with a 3x62m Multilink stent.  .Marland KitchenCARDIAC CATHETERIZATION  07/09/2009   Medical management  . CARDIAC CATHETERIZATION    . CAROTID DOPPLER  06/24/2009   40-59% R ICA stenosis. No significant ICA stenosis noted  . CHOLECYSTECTOMY    . CORONARY ANGIOPLASTY    . DILATION AND CURETTAGE OF UTERUS    . HIP ARTHROPLASTY Left 02/10/2020   Procedure: ARTHROPLASTY  BIPOLAR HIP (HEMIARTHROPLASTY) POSTERIOR LATERAL;  Surgeon: MRenette Butters MD;  [place] [facility]  Service: Orthopedics;  Laterality: Left;  . JOINT REPLACEMENT Left   . LESION REMOVAL Right 03/11/2015   Procedure:  EXCISION OF RIGHT PRE TIBIAL LESION;  Surgeon: JJudeth Horn MD;  [place]  Service: General;  Laterality: Right;  . MASS EXCISION Left 06/10/2014   Procedure: EXCISION LEFT LOWER LEG LESION;  Surgeon: JDoreen Salvage MD;  [place] [facility]  Service: General;  Laterality: Left;  .Marland KitchenMASS EXCISION Left 09/05/2015   Procedure: EXCISION OF LEFT FOREARM  MASS;  Surgeon: JJudeth Horn MD;  [place] [facility]  Service: General;  Laterality: Left;  . NM MYOVIEW LTD  10/2011   No ischemia or infarction.  Normal EF.  LOW RISK. (Short bursts of 5 beats NSVT during recovery otherwise normal)  . SHOULDER ARTHROSCOPY  10/15  . SHOULDER ARTHROSCOPY  1995   left  . TEE WITHOUT CARDIOVERSION N/A 08/03/2017   Procedure: TRANSESOPHAGEAL ECHOCARDIOGRAM (TEE);  Surgeon: RSkeet Latch MD;  [place]  Service: Cardiovascular;; EF 60-65% with no wall motion normalities.  No atrial thrombus noted.  Lightly findings consistent with a lipomatous hypertrophy of the atrial septum.   . TENDON REPAIR Left 05/12/2016   Procedure: Left Anterior Tibial Tendon Reconstruction;  Surgeon: MNewt Minion MD;  [place]  Service: Orthopedics;  Laterality: Left;  . TOTAL KNEE ARTHROPLASTY  2005   left  . TRANSESOPHAGEAL ECHOCARDIOGRAM  08/03/2016   : EF 60-65% no wall motion normalities.  No atrial thrombus.  Lipomatous hypertrophy of the atrial septum.  No mass noted.  . TRANSESOPHAGEAL ECHOCARDIOGRAM  07/2017   EF 60 to 65%.  No R WMA.  No atrial thrombus noted.  Lipomatous IAS.  .Marland KitchenTRANSTHORACIC ECHOCARDIOGRAM  02/25/2017   :EF 65-70%.  GR 1 DD.  No or WMA.  Moderate aortic calcification/sclerosis but no stenosis.  . TRANSTHORACIC  ECHOCARDIOGRAM  07/2017   Vigorous LV function.  EF 65-70%.  Mild diastolic dysfunction with no RWMA. GR 1 DD.  Mild aortic sclerosis/no stenosis.  Questionable right atrial mass discounted with TEE)   . WRIST ARTHROPLASTY  2010   cancer lt wrist   HPI:  Pt is an 79yo female admitted after a fall after getting out of shower with resulting L femoral neck fx. Pt underwent L posterior lateral THR with posterior hip precautions. Pt also found to have possible subacute infarct to R frontal lobe.  Pon on CIR and found to have hypotension and transferred to ICU. 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 Pt has also had TKR. CXR Stable cardiomegaly. No focal infiltrates or pulmonary abnormalities identified.   Assessment / Plan / Recommendation Clinical Impression  Pt denies dysphagia  as long as she manages volume. There no concerns with airway protection with any consistency including multiple straw sips thin. Mastication slightly prolonged as pt states she has not had her dentures donned in ahwile. She would like her meats chopped. Respirations are stable. Dys 3 ordered, thin liquids, pills with thin and likely upgrade to regular soon.   SLP Visit Diagnosis: Dysphagia, unspecified (R13.10)    Aspiration Risk  Mild aspiration risk    Diet Recommendation Dysphagia 3 (Mech soft);Thin liquid   Liquid Administration via: Cup;Straw Medication Administration: Whole meds with liquid Supervision: Patient able to self feed Compensations: Slow rate;Small sips/bites Postural Changes: Seated upright at 90 degrees    Other  Recommendations Oral Care Recommendations: Oral care BID   Follow up Recommendations None      Frequency and Duration min 1 x/week  1 week       Prognosis Prognosis for Safe Diet Advancement: Good      Swallow Study   General HPI: Pt is an 79 yo female admitted after a fall after getting out of shower  with resulting L femoral neck fx. Pt underwent L posterior lateral THR with posterior hip precautions. Pt also found to have possible subacute infarct to R frontal lobe.  Pon on CIR and found to have hypotension and transferred to ICU. 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 Pt has also had TKR. CXR Stable cardiomegaly. No focal infiltrates or pulmonary abnormalities identified. Type of Study: Bedside Swallow Evaluation Previous Swallow Assessment:  (none) Diet Prior to this Study: NPO Temperature Spikes Noted: No Respiratory Status: Nasal cannula History of Recent Intubation: No Behavior/Cognition: Alert;Cooperative;Pleasant mood Oral Cavity Assessment: Within Functional Limits Oral Care Completed by SLP: No Oral Cavity - Dentition: Dentures, top;Dentures, bottom;Edentulous Vision: Functional for self-feeding Self-Feeding Abilities: Able to feed self;Needs assist Patient Positioning: Upright in bed Baseline Vocal Quality: Normal Volitional Cough: Strong Volitional Swallow: Able to elicit    Oral/Motor/Sensory Function Overall Oral Motor/Sensory Function: Within functional limits   Ice Chips Ice chips: Not tested   Thin Liquid Thin Liquid: Within functional limits Presentation: Cup;Straw    Nectar Thick Nectar Thick Liquid: Not tested   Honey Thick Honey Thick Liquid: Not tested   Puree Puree: Within functional limits   Solid     Solid: Within functional limits      Houston Siren 02/18/2020,9:13 AM Orbie Pyo Colvin Caroli.Ed Risk analyst [phone] Office [phone]

## 2020-02-18 NOTE — Progress Notes (Signed)
eLink Physician-Brief Progress Note Patient Name: [name] DOB: 05-06-1940 MRN: [id number]   Date of Service  02/18/2020  HPI/Events of Note  Multiple issues: 1. Frequent loose stools - Request for Flexiseal and 2. INR = 3.8 in setting of GI bleeding.   eICU Interventions  Plan: 1. Place Flexiseal. 2. Transfuse 4 units of FFP now.  3. Repeat PT/INR at 5 AM.     Intervention Category Major Interventions: Other:  [name] 02/18/2020, 9:47 PM

## 2020-02-18 NOTE — Procedures (Signed)
Central Venous Catheter Insertion Procedure Note Daziya Redmond [id number] 11-21-1940  Procedure: Insertion of Central Venous Catheter Indications: Drug and/or fluid administration  Procedure Details Consent: Risks of procedure as well as the alternatives and risks of each were explained to the (patient/caregiver).  Consent for procedure obtained. Time Out: Verified patient identification, verified procedure, site/side was marked, verified correct patient position, special equipment/implants available, medications/allergies/relevent history reviewed, required imaging and test results available.  Performed  Maximum sterile technique was used including antiseptics, cap, gloves, gown, hand hygiene, mask and sheet. Skin prep: Chlorhexidine; local anesthetic administered A antimicrobial bonded/coated triple lumen catheter was placed in the right internal jugular vein using the Seldinger technique. Catheter placed to 15 cm. Blood aspirated via all 3 ports and then flushed x 3. Line sutured x 3 and dressing applied.  Ultrasound guidance used.Yes.    Evaluation Blood flow good Complications: No apparent complications Patient did tolerate procedure well. Chest X-Woodson ordered to verify placement.  CXR: normal.  Aurelie Dicenzo A, DO 02/18/2020, 7:18 PM Pager: [phone]

## 2020-02-18 NOTE — Progress Notes (Signed)
Inpatient Rehabilitation-Admissions Coordinator   Noted pt transferred to ICU after CIR admit. Will follow at a distance to see if pt becomes appropriate once again for CIR stay.   Raechel Ache, OTR/L  Rehab Admissions Coordinator  [phone] 02/18/2020 9:17 AM

## 2020-02-18 NOTE — Progress Notes (Signed)
Inpatient Rehabilitation  Patient information reviewed and entered into eRehab system by Azarion Hove M. Rodneisha Bonnet, M.A., CCC/SLP, PPS Coordinator.  Information including medical coding, functional ability and quality indicators will be reviewed and updated through discharge.    

## 2020-02-18 NOTE — Progress Notes (Signed)
NAME:  Staci Dack, MRN:  [id number], DOB:  03-08-1941, LOS: 1 ADMISSION DATE:  02/17/2020, CONSULTATION DATE:  02/17/2020 REFERRING MD:  Dr. Tamala Julian, CHIEF COMPLAINT:  Hypotension/ ABLA  Brief History   79 year old female previously admitted 11/12 to 11/19 for left hip fracture s/p hemiarthroplasty 11/14 and incidentally found to have subacute CVA and small SDH. Hospital course complicated by ABLA, hypoxia due to volume overload, and urinary retention.  Discharged to CIR, however developed nausea, abdominal/ epigastric pain with hypotension with Hgb drop from 8.2-> 5.4.  Vomited x 1 with questionable aspiration now on [place].  On arrival, ongoing hemodynamic instability, PCCM consulted for further management.   History of present illness   Limited HPI from patient.  Information obtained from medical chart review and from patient's daughter, [name] at bedside.   79 year old female with prior history of CAD s/p stent, CKD stage IV, DMT2, HFpEF, and breast cancer originally admitted 11/12 to 11/19 after mechanical fall with acute left subcapital femoral neck fracture with displacement and angulation; underwent left hip hemiarthroplasty on 11/14.   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  MRI brain done and revealed thin acute right frontal lobe SDH with moderate stable small vessel disease and suspected 5 mm incidental meningioma. 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  No role for surgical intervention per Neurosurgery.  She was managed on ASA/ plavix for stroke prevention.  Post-operatively, was anemic and transfused one unit PRBC on 11/14.  Additionally, hospitalization complicated by brief hypoxia felt due to fluid  overloaded in which resolved with lasix and urinary retention.  She was discharged to CIR on 11/19.  Hemoglobin at discharge was 8.9.    On 11/20, she complained of epigastric and abdominal discomfort without SOB with ongoing constipation.  KUB showed non-obstructive gas pattered with stool present in rectum.  UA negative.  Has been afebrile.  Overnight, she developed hypotension with ongoing abdominal and epigastric pain, nausea, SOB, and with one episode of reported emesis, unclear if bloody.  Post emesis, staff report she had a wet cough with audible rales, now requiring 3L [place].  Hgb today 5.4, down from 8.2 on 11/20.  She was accepted by [facility] and on arrival, with ongoing SBP in the 70's.  2 units PRBC, CXR, and CT abd/ pelvis ordered.   PCCM consulted for ICU transfer given hemodynamic instability.   Past Medical History  CAD s/p stent, CKD stage IV, DMT2, HFpEF, breast cancer  Significant Hospital Events   11/12- 11/19 hospitalization left hip fx/ subacute CVA/ small SDH.   Consults:  GI  Procedures:   Significant Diagnostic Tests:  CT A/P nothing actionable  Micro Data:   Antimicrobials:   Interim history/subjective:  No events, c/o being shaky this AM.  Objective   Blood pressure 92/60, pulse 100, temperature 97.7 F (36.5 C), temperature source Oral, resp. rate (!) 23, weight 89.6 kg, SpO2 100 %.        Intake/Output Summary (Last 24 hours) at 02/18/2020 0704 Last data filed at 02/18/2020 0000 Gross per 24 hour  Intake 1602.12 ml  Output --  Net 1602.12 ml   Filed Weights   02/17/20 1120  Weight: 89.6 kg    Examination: Constitutional: elderly woman in  NAD shaking Eyes: pupils equal, traching Ears, nose, mouth, and throat: trachea midline, MMM Cardiovascular: RRR, ext warm Respiratory: Rhonci at bases, no accessory muscle use Gastrointestinal: Soft, +BS Skin: No rashes, normal turgor Neurologic: globally weak, moves all 4 ext to command Psychiatric: RASS  0  Sugars on higher side 2x unmeasured urine output  Resolved Hospital Problem list    Assessment & Plan:  Postop anemia, CT A/P benign, reports of melena overnight - PPI BID - Trend H/H - GI consult called to [facility]  Pulmonary nodule- LLL 66mm  Hypoxemia related to atelectasis improved with IS - Encourage IS and progressive mobility  Mild AKI on CKD stage IV HFpEF - Gentle hydration, watch for s/s of volume overload - Holding HTN/ diuretics with hypotension  DMT2- A1c 8.8% -SSI, add levemir -CBG qACHS  Left hip fx s/p repair 11/14 - outpt ortho f/u - PT/ OT when able   Subacute CVA/ small SDH on previous admit -Non-focal -Serial neuro exams -Holding ASA/ plavix with ABLA  Best practice:  Diet: NPO pending GI eval Pain/Anxiety/Delirium protocol (if indicated): n/a VAP protocol (if indicated): n/a DVT prophylaxis: SCDs GI prophylaxis: PPI Glucose control: see above Mobility: BR for now Code Status: Full Family Communication: updated patient Disposition: Watch today to make sure H/H, Bps stable then likely floor tomorrow  Erskine Emery MD PCCM

## 2020-02-18 NOTE — Progress Notes (Addendum)
Events through day:  Hypotension initially though secondary to vagal stimulation from urinary retention, persistent after foley insertion and evacuation of 1L urine.  H/H then trended lower, patient with poor IV access so central line placed.  2 units pRBC orderd with 3rd to be given if still on pressors. Tentatively planned for EGD in AM based on Dr. Leanna Sato note.  Updated E-link MD around 7:30PM to be on lookout for post transfusion H/H.  Daughter updated by resident physician.   Patient critically ill due to hemorrhagic shock Interventions to address this today transfusions, pressor titration, GI consultation Risk of deterioration without these interventions is high  I personally spent 34 minutes providing critical care not including any separately billable procedures  Erskine Emery MD [facility] 02/18/2020 8:27 PM Personal pager: [phone] If unanswered, please page CCM On-call: [phone]

## 2020-02-18 NOTE — Progress Notes (Signed)
Unable to insert PICC,no suitable veins found. NO Right cephalic and basilic vein seen,brachial vein is very small to measure.Pt is limited to right arm only.RN aware and MD at bedside.

## 2020-02-18 NOTE — Evaluation (Signed)
Physical Therapy Evaluation Patient Details Name: [name] MRN: [id number] DOB: 12-06-1940 Today's Date: 02/18/2020   History of Present Illness  Pt is an 79 yo female admitted after a fall after getting out of shower with resulting L femoral neck fx 11/12. Pt underwent L posterior lateral THR with posterior hip precautions 11/14 and is WBAT.  Pt also found to have possible subacute infarct to R frontal lobe.  Neuorsurgery not sure it is a true hematoma and has no follow up. Pt did hit back of head in fall as seen with abrasion. 11/21 pt had a hypotensive crisis and hemoglobin drop and was transferred to ICU. 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 Pt has also had TKR.  Clinical Impression  Pt supine on arrival and stating willingness to mobilize. Pt on 2L on arrival with SpO2 100% with pt on RA-1L throughout session as SpO2 with inaccurate pleth but maintaining >95%. Pt with significant weakness, inability to significantly assist with all mobility requiring max -total +2 to get to EOB. Pt with decreased strength, function, balance, cognition and limited tolerance for upright. Pt will benefit from acute therapy to maximize mobility, safety and function to decrease burden of care. Given pt current assist SNF is appropriate compared to prior CIR recommendation unless pt shows significant mobility progression acutely.   Supine 109/46 (64) Sitting 110/90 (98) Sitting 3 min 118/73 (87) HR 107-111 throughout    Follow Up Recommendations SNF;Supervision/Assistance - 24 hour    Equipment Recommendations  None recommended by PT    Recommendations for Other Services       Precautions / Restrictions Precautions Precautions: Posterior Hip;Fall Precaution Booklet Issued: Yes (comment) Precaution Comments: reviewed hip precautions with handout provided Other Brace: Pt has foot braces due to charcot which are currently at  home Restrictions LLE Weight Bearing: Weight bearing as tolerated      Mobility  Bed Mobility Overal bed mobility: Needs Assistance Bed Mobility: Supine to Sit;Rolling Rolling: Max assist;+2 for physical assistance;+2 for safety/equipment   Supine to sit: +2 for physical assistance;Max assist Sit to supine: Total assist;+2 for physical assistance   General bed mobility comments: max +2 assist to roll bil for pericare with billow between knees. supine to sit with HOB 30 degrees with use of pad to pivot pelvis, assist to move legs to EOB and cues for hands on rail to assist trunk. Return to supine with total +2 assist to control trunk and pivot legs to surface    Transfers Overall transfer level: Needs assistance   Transfers: Sit to/from Stand Sit to Stand: Total assist;+2 physical assistance         General transfer comment: total +2 assist with bil knees blocked, use of pad and belt to rise with pt not engaging quads or assisting rise with elevated bed  Ambulation/Gait             General Gait Details: unable  Stairs            Wheelchair Mobility    Modified Rankin (Stroke Patients Only)       Balance Overall balance assessment: Needs assistance Sitting-balance support: Feet supported;Bilateral upper extremity supported Sitting balance-Leahy Scale: Poor Sitting balance - Comments: EOb grossly 6 min with min assist  Pertinent Vitals/Pain Pain Assessment: Faces Faces Pain Scale: Hurts little more Pain Location: Lft hip with movement Pain Descriptors / Indicators: Aching;Discomfort;Grimacing;Sore;Guarding Pain Intervention(s): Limited activity within patient's tolerance;Repositioned;Monitored during session    [place] expects to be discharged to:: Private residence Living Arrangements: Spouse/significant other Available Help at Discharge: Available 24 hours/day Type of Home:  House Home Access: Stairs to enter Entrance Stairs-Rails: Psychiatric nurse of Steps: 4 Home Layout: One level Home Equipment: Environmental consultant - 4 wheels;Wheelchair - manual Additional Comments: Pt lives in ones story home with 3 steps to enter, has both tub-shower and walk in shower with built-in seats and grab bars. Has a RW and Cane    Prior Function Level of Independence: Independent with assistive device(s)         Comments: Pt could not drive but was able to do most other adls other than cooking bc her husband does this. Pt reports use of cane or furniture as RW does not fit in her house.     Hand Dominance        Extremity/Trunk Assessment   Upper Extremity Assessment Upper Extremity Assessment: Defer to OT evaluation    Lower Extremity Assessment Lower Extremity Assessment: LLE deficits/detail;Generalized weakness RLE Deficits / Details: generalized weakness with strength grossly 2/5 with assist for all movement and slow to slide RLE toward EOB LLE Deficits / Details: physical assist for all movement of LLE with strength 2-/5 with limited ROM due to precautions and pain    Cervical / Trunk Assessment Cervical / Trunk Assessment: Kyphotic  Communication   Communication: No difficulties  Cognition Arousal/Alertness: Awake/alert Behavior During Therapy: Anxious Overall Cognitive Status: Impaired/Different from baseline Area of Impairment: Following commands;Safety/judgement;Problem solving                       Following Commands: Follows one step commands inconsistently;Follows one step commands with increased time Safety/Judgement: Decreased awareness of deficits;Decreased awareness of safety   Problem Solving: Slow processing;Requires verbal cues;Requires tactile cues General Comments: pt with anxiety with mobility and reports "blacking out" despite stable vital signs with mobility. Pt with increased time to follow commands along with physical  assist. Pt unable to state precautions and educated for all      General Comments      Exercises     Assessment/Plan    PT Assessment Patient needs continued PT services  PT Problem List Decreased strength;Decreased range of motion;Decreased activity tolerance;Decreased balance;Decreased mobility;Decreased coordination;Decreased cognition;Pain;Decreased safety awareness;Decreased knowledge of use of DME       PT Treatment Interventions DME instruction;Therapeutic exercise;Gait training;Balance training;Functional mobility training;Therapeutic activities;Patient/family education;Cognitive remediation;Neuromuscular re-education    PT Goals (Current goals can be found in the Care Plan section)  Acute Rehab PT Goals Patient Stated Goal: be able to get up and go to the toilet and walk PT Goal Formulation: With patient Time For Goal Achievement: 03/03/20 Potential to Achieve Goals: Fair    Frequency Min 3X/week   Barriers to discharge Decreased caregiver support      Co-evaluation PT/OT/SLP Co-Evaluation/Treatment: Yes Reason for Co-Treatment: Complexity of the patient's impairments (multi-system involvement);For patient/therapist safety PT goals addressed during session: Mobility/safety with mobility;Strengthening/ROM         AM-PAC PT "6 Clicks" Mobility  Outcome Measure Help needed turning from your back to your side while in a flat bed without using bedrails?: Total Help needed moving from lying on your back to sitting on the side of a flat bed without  using bedrails?: Total Help needed moving to and from a bed to a chair (including a wheelchair)?: Total Help needed standing up from a chair using your arms (e.g., wheelchair or bedside chair)?: Total Help needed to walk in hospital room?: Total Help needed climbing 3-5 steps with a railing? : Total 6 Click Score: 6    End of Session Equipment Utilized During Treatment: Gait belt;Oxygen Activity Tolerance: Patient  tolerated treatment well;Patient limited by fatigue Patient left: with call bell/phone within reach;in bed;with bed alarm set Nurse Communication: Mobility status;Need for lift equipment;Precautions;Weight bearing status PT Visit Diagnosis: Muscle weakness (generalized) (M62.81);Pain;Other abnormalities of gait and mobility (R26.89);Unsteadiness on feet (R26.81);Difficulty in walking, not elsewhere classified (R26.2) Pain - Right/Left: Left Pain - part of body: Hip    Time: 2060-1561 PT Time Calculation (min) (ACUTE ONLY): 27 min   Charges:   PT Evaluation $PT Eval Moderate Complexity: 1 Mod          [place], PT Acute Rehabilitation Services Pager: [phone] Office: [phone]   Sandy Salaam Kian Ottaviano 02/18/2020, 10:46 AM

## 2020-02-18 NOTE — Consult Note (Signed)
Referring Provider:  TH Primary Care Physician:  Patel, Nilay, DO Primary Gastroenterologist:  Dr. Magod  Reason for Consultation:  GI Bleed   HPI: [name] is a 79 y.o. female with multiple medical comorbidities including coronary artery disease, chronic kidney disease stage IV, history of breast cancer was admitted to the hospital on November 12 with mechanical fall left femoral neck fracture.  Underwent left hemiarthroplasty.  She also was found to have acute right frontal lobe subdural hematoma as well as possible subacute right frontal cortical infarct and was started on aspirin and Plavix for stroke prevention.  Patient subsequently had epigastric discomfort and one episode of nonbloody vomiting.  She was found to have drop in hemoglobin to 5.4.  GI is consulted for further evaluation.  Records reviewed.  Patient with intermittent anemia in the past.  Hemoglobin was around 11.4 on November 13.  It dropped postoperatively on November 14-8.8.  Subsequently hemoglobin improved to 11.8.  Hemoglobin dropped to 8.9 on November 19 and subsequently to 5.4 yesterday.  CT abdomen pelvis without contrast yesterday showed no acute changes.  No evidence of large hematoma.  Patient seen and examined at bedside in ICU.  Discussed with physical therapy staff at bedside.  She is having melanotic stool.  She denied seeing any blood in the stool.  Denied any history of gastric ulcers in the past.  She is complaining of fatigue and weakness.    Past Medical History:  Diagnosis Date  . Anemia    have received iron infusions  . Anxiety   . Arthritis     osteoarthritis  . Breast cancer (HCC)   . Breast cancer in female (HCC)    Bilateral  . CAD S/P percutaneous coronary angioplasty 2007; March 2011   [facility] 3.0 mm 20 mm postdilated 3.6 mm in early mid LAD; status post ISR Cutting Balloon PTCA and March 11 along with PCI of distal mid lesion with a 3.0 mm 12 mm MultiLink vision BMS; the  proximal stent causes jailing of SP1 and SP2 with ostial 70-80% lesions  . Cancer (HCC)    Melanoma, Squamous cell Carcinoma  . Charcot's joint of foot, right   . Charcot's joint of left foot   . Chronic diastolic CHF (congestive heart failure), NYHA class 2 (HCC) 02/28/2018  . Chronic kidney disease    stage 2   . Coronary artery disease   . Dementia (HCC)    mild  . Diabetes mellitus type 2 with neurological manifestations (HCC)   . Diabetes mellitus without complication (HCC)   . Dyslipidemia, goal LDL below 70   . Foot pain, bilateral   . Full dentures   . Genetic testing 12/03/2016   Germline genetic testing was performed through [employer] Common Hereditary Cancers Panel + [employer]'s Melanoma Panel. This custom panel includes analysis of the following 51 genes: APC, ATM, AXIN2, BAP1, BARD1, BMPR1A, BRCA1, BRCA2, BRIP1, CDH1, CDK4, CDKN2A, CHEK2, CTNNA1, DICER1, EPCAM, GREM1, HOXB13, KIT, MEN1, MITF, MLH1, MSH2, MSH3, MSH6, MUTYH, NBN, NF1, NTHL1, PALB2, PDGFRA, PMS2, POLD1, POL  . GERD (gastroesophageal reflux disease)   . Gout   . Headache(784.0)   . History of hematuria    Followed by Dr. Tannenbaum  . Hypertension, essential, benign   . Migraine   . Mild cognitive impairment   . Peripheral neuropathy   . Personal history of radiation therapy   . Post herpetic neuralgia   . Restless leg syndrome   . Stroke (HCC)   .   TIA (transient ischemic attack)    multiple in the past  . TIA (transient ischemic attack)   . Wears glasses     Past Surgical History:  Procedure Laterality Date  . ABDOMINAL HYSTERECTOMY    . ANKLE FUSION Right 02/22/2018   Procedure: RIGHT SUBTALAR AND TALONAVICULAR FUSION;  Surgeon: Newt Minion, MD;  Location: [facility];  Service: Orthopedics;  Laterality: Right;  . APPENDECTOMY    . BLADDER SUSPENSION    . BREAST LUMPECTOMY Bilateral 2018  . BREAST LUMPECTOMY WITH RADIOACTIVE SEED AND SENTINEL LYMPH NODE BIOPSY Bilateral 12/14/2016   Procedure:  BILATERAL BREAST LUMPECTOMY WITH BILATERAL  RADIOACTIVE SEED AND BILATERAL AXILLARY  SENTINEL LYMPH NODE BIOPSY ERAS PATHWAY;  Surgeon: Alphonsa Overall, MD;  Location: [facility];  Service: General;  Laterality: Bilateral;  PECTORAL BLOCK  . CARDIAC CATHETERIZATION  02/24/2006   85% stenosis in the proximal portion of LAD-arrangements made for PCI on 02/25/2006  . CARDIAC CATHETERIZATION  02/25/2006   80% LAD lesion stented with a 3x79m [employer] stent resulting in reduction of 80% lesion to 0% residual  . CARDIAC CATHETERIZATION  04/26/2006   Medical management  . CARDIAC CATHETERIZATION  06/24/2009   60-70% re-stenosis in the proximal LAD. A 3.25x15 cutting balloon, 3 inflations - 14atm-38sec, 13atm-39sec, and 12atm-40sec reduced to less than 10%. 60% stenosis of the mid/distal LAD stented with a 3x164mMultilink stent.  . Marland KitchenARDIAC CATHETERIZATION  07/09/2009   Medical management  . CARDIAC CATHETERIZATION    . CAROTID DOPPLER  06/24/2009   40-59% R ICA stenosis. No significant ICA stenosis noted  . CHOLECYSTECTOMY    . CORONARY ANGIOPLASTY    . DILATION AND CURETTAGE OF UTERUS    . HIP ARTHROPLASTY Left 02/10/2020   Procedure: ARTHROPLASTY BIPOLAR HIP (HEMIARTHROPLASTY) POSTERIOR LATERAL;  Surgeon: MuRenette ButtersMD;  [place] Service: Orthopedics;  Laterality: Left;  . JOINT REPLACEMENT Left   . LESION REMOVAL Right 03/11/2015   Procedure:  EXCISION OF RIGHT PRE TIBIAL LESION;  Surgeon: JaJudeth HornMD;  [place] Service: General;  Laterality: Right;  . MASS EXCISION Left 06/10/2014   Procedure: EXCISION LEFT LOWER LEG LESION;  Surgeon: JaDoreen SalvageMD;  [place] [facility] Service: General;  Laterality: Left;  . Marland KitchenASS EXCISION Left 09/05/2015   Procedure: EXCISION OF LEFT FOREARM  MASS;  Surgeon: JaJudeth HornMD;  [place] Service: General;  Laterality: Left;  . NM MYOVIEW LTD  10/2011   No ischemia or infarction.   Normal EF.  LOW RISK. (Short bursts of 5 beats NSVT during recovery otherwise normal)  . SHOULDER ARTHROSCOPY  10/15  . SHOULDER ARTHROSCOPY  1995   left  . TEE WITHOUT CARDIOVERSION N/A 08/03/2017   Procedure: TRANSESOPHAGEAL ECHOCARDIOGRAM (TEE);  Surgeon: RaSkeet LatchMD;  [place] [facility] Service: Cardiovascular;; EF 60-65% with no wall motion normalities.  No atrial thrombus noted.  Lightly findings consistent with a lipomatous hypertrophy of the atrial septum.   . TENDON REPAIR Left 05/12/2016   Procedure: Left Anterior Tibial Tendon Reconstruction;  Surgeon: MaNewt MinionMD;  [place] [facility] Service: Orthopedics;  Laterality: Left;  . TOTAL KNEE ARTHROPLASTY  2005   left  . TRANSESOPHAGEAL ECHOCARDIOGRAM  08/03/2016   : EF 60-65% no wall motion normalities.  No atrial thrombus.  Lipomatous hypertrophy of the atrial septum.  No mass noted.  . TRANSESOPHAGEAL ECHOCARDIOGRAM  07/2017   EF 60  to 65%.  No R WMA.  No atrial thrombus noted.  Lipomatous IAS.  . TRANSTHORACIC ECHOCARDIOGRAM  02/25/2017   :EF 65-70%.  GR 1 DD.  No or WMA.  Moderate aortic calcification/sclerosis but no stenosis.  . TRANSTHORACIC ECHOCARDIOGRAM  07/2017   Vigorous LV function.  EF 65-70%.  Mild diastolic dysfunction with no RWMA. GR 1 DD.  Mild aortic sclerosis/no stenosis.  Questionable right atrial mass discounted with TEE)   . WRIST ARTHROPLASTY  2010   cancer lt wrist    Prior to Admission medications   Medication Sig Start Date End Date Taking? Authorizing Provider  bethanechol (URECHOLINE) 25 MG tablet Take 1 tablet (25 mg total) by mouth 3 (three) times daily. 02/15/20 03/16/20  Patel, Ekta V, MD  BIOTIN PO Take 1 tablet by mouth daily.    [provider]  bumetanide (BUMEX) 2 MG tablet Take 2 mg by mouth daily. 2 in morning and 1 in afternoon    [provider]  Calcium Carb-Cholecalciferol (CALCIUM 600+D3) 600-800 MG-UNIT TABS Take 1 tablet by mouth daily.    [provider]  Cholecalciferol (VITAMIN D) 50 MCG (2000 UT) tablet Take 2,000 Units by mouth daily.    [provider]  clopidogrel (PLAVIX) 75 MG tablet TAKE 1 TABLET DAILY 04/15/19   Harding, David W, MD  clopidogrel (PLAVIX) 75 MG tablet Take 75 mg by mouth daily. 01/10/20   [provider]  clopidogrel (PLAVIX) 75 MG tablet Take 1 tablet (75 mg total) by mouth daily. 02/16/20 03/17/20  Patel, Ekta V, MD  Coenzyme Q10 (COQ-10 PO) Take 1 tablet by mouth daily.    [provider]  colchicine 0.6 MG tablet Take 0.6 mg by mouth daily as needed (for up to 3 days for gout flare).    [provider]  COLCRYS 0.6 MG tablet TAKE 1 TABLET DAILY AS NEEDED FOR GOUT FLARE. 12/27/18   Duda, Marcus V, MD  Cranberry 500 MG TABS Take 500 mg by mouth every evening.    [provider]  CRANBERRY-CALCIUM PO Take 1 tablet by mouth daily.    [provider]  diazepam (VALIUM) 5 MG tablet Take 1 tablet (5 mg total) by mouth 2 (two) times daily as needed for anxiety. 03/02/18   Choi, Jennifer, DO  diazepam (VALIUM) 5 MG tablet Take 5 mg by mouth 3 (three) times daily as needed. 10/05/19   [provider]  exemestane (AROMASIN) 25 MG tablet TAKE 1 TABLET DAILY AFTER BREAKFAST 03/12/19   Feng, Yan, MD  febuxostat (ULORIC) 40 MG tablet TAKE 1 TABLET DAILY 10/02/19   Duda, Marcus V, MD  febuxostat (ULORIC) 40 MG tablet Take 40 mg by mouth daily. 12/31/19   [provider]  feeding supplement, GLUCERNA SHAKE, (GLUCERNA SHAKE) LIQD Take 237 mLs by mouth 3 (three) times daily between meals for 7 days. 02/15/20 02/22/20  Patel, Ekta V, MD  gabapentin (NEURONTIN) 300 MG capsule Take 1 capsule (300 mg total) by mouth 2 (two) times daily. Take 1 during the day if needed and 2 at night 04/06/18   Martin, Nancy Carolyn, NP  gabapentin (NEURONTIN) 300 MG capsule Take 300 mg by mouth 2 (two) times daily.  12/18/19   [provider]  JANUVIA 25 MG tablet Take 25 mg  by mouth daily. 12/26/19   [provider]  JANUVIA 25 MG tablet Take 25 mg by mouth daily. 12/26/19   [provider]  Krill Oil 500 MG CAPS   Take 500 mg by mouth daily.    [provider]  KRILL OIL PO Take 1 capsule by mouth daily.    [provider]  Methylfol-Methylcob-Acetylcyst (CEREFOLIN NAC) 6-2-600 MG TABS TAKE 1 TABLET BY MOUTH EVERY DAY 06/07/19   Sethi, Pramod S, MD  Methylfol-Methylcob-Acetylcyst (CEREFOLIN NAC) 6-2-600 MG TABS Take 1 tablet by mouth in the morning.    [provider]  metolazone (ZAROXOLYN) 2.5 MG tablet TAKE 1 TABLET DAILY 30 MINUTES BEFORE THE MORNING DOSE OF BUMEX AS DIRECTED 08/09/18   Harding, David W, MD  metolazone (ZAROXOLYN) 2.5 MG tablet Take 2.5 mg by mouth daily. 30 minutes before Bumetanide Mon. & Thurs.    [provider]  metoprolol succinate (TOPROL-XL) 25 MG 24 hr tablet Take 12.5 mg by mouth daily.    [provider]  metoprolol succinate (TOPROL-XL) 25 MG 24 hr tablet Take 25 mg by mouth at bedtime.  12/27/19   [provider]  mirtazapine (REMERON) 15 MG tablet Take 15 mg by mouth at bedtime. Taking 1 1/2 tablet at bedtime    [provider]  mirtazapine (REMERON) 15 MG tablet Take 15 mg by mouth at bedtime.  01/29/20   [provider]  nitroGLYCERIN (NITROLINGUAL) 0.4 MG/SPRAY spray Place 1 spray under the tongue every 5 (five) minutes x 3 doses as needed for chest pain.    [provider]  nitroGLYCERIN (NITROSTAT) 0.4 MG SL tablet Place 0.4 mg under the tongue every 5 (five) minutes as needed for chest pain.    [provider]  oxyCODONE-acetaminophen (PERCOCET/ROXICET) 5-325 MG tablet Take 1 tablet by mouth every 6 (six) hours as needed for severe pain. 07/30/19   Persons, Mary Anne, PA  pantoprazole (PROTONIX) 40 MG tablet Take 40 mg by mouth daily.    [provider]  potassium chloride (KLOR-CON) 20 MEQ packet Take 20 mEq by mouth 2  (two) times daily. 2 tablets in the morning and 1 at bedtime    [provider]  pramipexole (MIRAPEX) 0.25 MG tablet TAKE 1 TO 2 TABLETS AS INSTRUCTED, 1 TABLET AT 4:00 P.M. AND 2 TABLETS AT BEDTIME 09/27/19   McCue, Jessica, NP  pramipexole (MIRAPEX) 0.25 MG tablet Take 0.75 mg by mouth at bedtime.  12/26/19   [provider]  Resveratrol 250 MG CAPS Take 500 mg by mouth daily.    [provider]  rosuvastatin (CRESTOR) 20 MG tablet Take 20 mg by mouth daily.    [provider]  rosuvastatin (CRESTOR) 5 MG tablet Take 5 mg by mouth daily.    [provider]  sennosides-docusate sodium (SENOKOT-S) 8.6-50 MG tablet Take 1 tablet by mouth daily as needed for constipation.    [provider]  Ubiquinol (QUNOL COQ10/UBIQUINOL/MEGA) 100 MG CAPS Take 100 mg by mouth every evening. CoQ10, Qunol     [provider]    Scheduled Meds: . Cerefolin NAC  1 tablet Oral q AM  . Chlorhexidine Gluconate Cloth  6 each Topical Daily  . exemestane  25 mg Oral QPC breakfast  . febuxostat  40 mg Oral Daily  . feeding supplement (GLUCERNA SHAKE)  237 mL Oral TID WC  . Gerhardt's butt cream  1 application Topical TID  . insulin aspart  0-15 Units Subcutaneous Q4H  . insulin detemir  8 Units Subcutaneous BID  . linagliptin  5 mg Oral Daily  . mirtazapine  15 mg Oral QHS  . multivitamin with minerals  1 tablet Oral   Daily  . pantoprazole (PROTONIX) IV  40 mg Intravenous Q12H  . rosuvastatin  20 mg Oral Daily  . senna-docusate  2 tablet Oral BID   Continuous Infusions: . sodium chloride    . lactated ringers     PRN Meds:.acetaminophen, alum & mag hydroxide-simeth, bisacodyl, guaiFENesin-dextromethorphan, lidocaine, menthol-cetylpyridinium **OR** phenol, methocarbamol, nitroGLYCERIN, ondansetron (ZOFRAN) IV, polyethylene glycol, prochlorperazine **OR** prochlorperazine **OR** prochlorperazine, sodium phosphate, traZODone  Allergies as of 02/17/2020 -  Review Complete 02/17/2020  Allergen Reaction Noted  . Nsaids Nausea And Vomiting and Palpitations 02/15/2020  . Reglan [metoclopramide] Other (See Comments) 02/15/2020  . Tramadol Other (See Comments) 02/15/2020  . Ultram [tramadol] Other (See Comments) 10/04/2013  . Lipitor [atorvastatin] Other (See Comments) 12/26/2012  . Lipitor [atorvastatin] Swelling and Other (See Comments) 02/15/2020  . Lyrica [pregabalin] Other (See Comments) 03/03/2017  . Lyrica [pregabalin] Swelling 02/15/2020  . Reglan [metoclopramide] Other (See Comments) 10/04/2013  . Nsaids Swelling 09/18/2012  . Urecholine [bethanechol]  02/15/2020    Family History  Problem Relation Age of Onset  . Diabetes Mother   . Kidney disease Mother   . Diabetes Sister   . Diabetes Brother   . Hypertension Mother   . Hypertension Father   . Emphysema Father   . Heart attack Father   . Heart disease Father   . Arthritis Sister   . Diabetes Sister   . Hypertension Sister   . Breast cancer Sister 30       treated with mastectomy  . Cervical cancer Sister 20  . Hypertension Brother   . Hyperlipidemia Brother   . Diabetes Brother   . Stroke Brother   . Diabetes Sister   . Hypertension Sister   . Stomach cancer Sister 68  . Hypertension Brother   . ALS Brother        d.62s  . Breast cancer Daughter 40       triple negative treated with lumpectomy, chemo, radiation  . Heart attack Daughter 45       d.45  . COPD Daughter   . Cancer Paternal Aunt        unspecified type  . Cancer Paternal Uncle        unspecified type  . Cancer Paternal Uncle        unspecified type    Social History   Socioeconomic History  . Marital status: Married    Spouse name: Jerry  . Number of children: 3  . Years of education: 11  . Highest education level: Not on file  Occupational History  . Occupation: retired    Employer: RETIRED  Tobacco Use  . Smoking status: Never Smoker  . Smokeless tobacco: Never Used  Vaping Use   . Vaping Use: Never used  Substance and Sexual Activity  . Alcohol use: Never  . Drug use: Never  . Sexual activity: Never  Other Topics Concern  . Not on file  Social History Narrative   ** Merged History Encounter **       She is a married mother of 3, grandmother of 2.  Usually very active and out about the house.  But currently over last 6-8 weeks has been incapacitated.  She never smoked and does not drink alcohol. 10 th grade   Social Determinants of Health   Financial Resource Strain:   . Difficulty of Paying Living Expenses: Not on file  Food Insecurity: No Food Insecurity  . Worried About Running Out of Food in the Last Year: Never   true  . Ran Out of Food in the Last Year: Never true  Transportation Needs: No Transportation Needs  . Lack of Transportation (Medical): No  . Lack of Transportation (Non-Medical): No  Physical Activity:   . Days of Exercise per Week: Not on file  . Minutes of Exercise per Session: Not on file  Stress:   . Feeling of Stress : Not on file  Social Connections:   . Frequency of Communication with Friends and Family: Not on file  . Frequency of Social Gatherings with Friends and Family: Not on file  . Attends Religious Services: Not on file  . Active Member of Clubs or Organizations: Not on file  . Attends Archivist Meetings: Not on file  . Marital Status: Not on file  Intimate Partner Violence:   . Fear of Current or Ex-Partner: Not on file  . Emotionally Abused: Not on file  . Physically Abused: Not on file  . Sexually Abused: Not on file    Review of Systems: All negative except as stated above in HPI.  Physical Exam: Vital signs: Vitals:   02/18/20 0700 02/18/20 0724  BP: (!) 135/41   Pulse: (!) 104   Resp: (!) 25   Temp:  97.7 F (36.5 C)  SpO2: 100%    Last BM Date: 02/18/20 General: Elderly patient, in mild distress Lungs:  Clear throughout to auscultation.   No wheezes, crackles, or rhonchi. No acute  distress. Abdomen.  Epigastric discomfort on palpation, abdomen is distended, bowel sounds present, no peritoneal signs Heart:  Regular rate and rhythm; no murmurs, clicks, rubs,  or gallops. Neuro.  Alert/oriented x3 Psych, anxious Lower extremity.  Trace edema Rectal:  Deferred  GI:  Lab Results: Recent Labs    02/16/20 0111 02/16/20 0111 02/17/20 0804 02/17/20 1804 02/18/20 0404  WBC 13.3*  --  17.6*  --  27.0*  HGB 8.2*   < > 5.4* 5.9* 7.7*  HCT 26.2*   < > 17.5* 18.6* 22.5*  PLT 261  --  326  --  240   < > = values in this interval not displayed.   BMET Recent Labs    02/16/20 0111 02/17/20 0804 02/18/20 0404  NA 137 137 138  K 3.9 4.8 4.9  CL 100 103 105  CO2 29 23 21*  GLUCOSE 185* 219* 209*  BUN 47* 88* 119*  CREATININE 1.39* 1.63* 2.19*  CALCIUM 8.6* 8.3* 8.2*   LFT Recent Labs    02/16/20 0111  PROT 5.3*  ALBUMIN 2.2*  AST 30  ALT 22  ALKPHOS 76  BILITOT 0.6   PT/INR No results for input(s): LABPROT, INR in the last 72 hours.   Studies/Results: CT ABDOMEN PELVIS WO CONTRAST  Result Date: 02/17/2020 CLINICAL DATA:  Nausea and vomiting with drop in hemoglobin. Recent hip surgery. EXAM: CT ABDOMEN AND PELVIS WITHOUT CONTRAST TECHNIQUE: Multidetector CT imaging of the abdomen and pelvis was performed following the standard protocol without IV contrast. COMPARISON:  CT dated 11/16/2015 FINDINGS: Lower chest: There is a partially visualized pulmonary nodule in the left lower lobe measuring approximately 5 mm (axial series 4, image 1).The intracardiac blood pool is hypodense relative to the adjacent myocardium consistent with anemia. The heart size is normal. There are coronary artery calcifications. Hepatobiliary: The liver is normal. Status post cholecystectomy.There is no biliary ductal dilation. Pancreas: Normal contours without ductal dilatation. No peripancreatic fluid collection. Spleen: Unremarkable. Adrenals/Urinary Tract: --Adrenal glands: There  is a stable left adrenal  mass, consistent with a benign adrenal adenoma. --Right kidney/ureter: No hydronephrosis or radiopaque kidney stones. --Left kidney/ureter: No hydronephrosis or radiopaque kidney stones. --Urinary bladder: There is a tiny focus of gas within the urinary bladder, likely representing sequela of recent instrumentation. Stomach/Bowel: --Stomach/Duodenum: No hiatal hernia or other gastric abnormality. Normal duodenal course and caliber. --Small bowel: Unremarkable. --Colon: There is an above average amount of stool in the colon. There are few scattered colonic diverticula without CT evidence for diverticulitis. --Appendix: Normal. Vascular/Lymphatic: Atherosclerotic calcification is present within the non-aneurysmal abdominal aorta, without hemodynamically significant stenosis. --No retroperitoneal lymphadenopathy. --No mesenteric lymphadenopathy. --No pelvic or inguinal lymphadenopathy. Reproductive: The pelvis is suboptimally evaluated secondary to streak artifact. However, the patient appears to be status post prior hysterectomy. Other: No ascites or free air. The abdominal wall is normal. Musculoskeletal. There are expected postsurgical changes related to recent total hip arthroplasty on the left. There is no large adjacent hematoma. The hardware appears grossly intact where visualized. IMPRESSION: 1. No acute intra-abdominal abnormality detected. No specific abnormality detected to explain the patient's anemia. 2. Expected postsurgical changes related to recent left total hip arthroplasty. 3. There is a partially visualized 5 mm pulmonary nodule in the left lower lobe. In a low risk patient, no further follow-up is required. No follow-up needed if patient is low-risk. Non-contrast chest CT can be considered in 12 months if patient is high-risk. This recommendation follows the consensus statement: Guidelines for Management of Incidental Pulmonary Nodules Detected on CT Images: From the  Fleischner Society 2017; Radiology 2017; 284:228-243. 4. Anemia. Aortic Atherosclerosis (ICD10-I70.0). Electronically Signed   By: Constance Holster M.D.   On: 02/17/2020 19:27   DG Abd 1 View  Result Date: 02/16/2020 CLINICAL DATA:  Nausea, abdominal pain EXAM: ABDOMEN - 1 VIEW COMPARISON:  None. FINDINGS: The stomach is gas filled. Nonobstructive pattern of bowel gas with scattered gas and stool present to the rectum. No free air in the abdomen on supine radiographs. Status post left hip total arthroplasty. No radio-opaque calculi or other significant radiographic abnormality are seen. IMPRESSION: The stomach is gas filled. Nonobstructive pattern of bowel gas with scattered gas and stool present to the rectum. No free air in the abdomen on supine radiographs. Electronically Signed   By: Eddie Candle M.D.   On: 02/16/2020 20:59   DG Chest Port 1 View  Result Date: 02/17/2020 CLINICAL DATA:  Respiratory distress.  Hypertension. EXAM: PORTABLE CHEST 1 VIEW COMPARISON:  None. FINDINGS: Stable cardiomegaly. The hila and mediastinum are unchanged. The lungs are clear. No other acute abnormalities. IMPRESSION: Stable cardiomegaly. No focal infiltrates or pulmonary abnormalities identified. Electronically Signed   By: Dorise Bullion III M.D   On: 02/17/2020 12:26   [place] EKG SITE RITE  Result Date: 02/17/2020 If Site Rite image not attached, placement could not be confirmed due to current cardiac rhythm.   Impression/Plan: -Epigastric pain with melena and acute blood loss anemia.  Need to rule out ulcer disease. -Acute blood loss anemia.  Hemoglobin improved to 7.7 after blood transfusion. -History of coronary artery disease -Subacute CVA.  Aspirin and Plavix currently on hold.  Recommendations ---------------------- -Tentatively plan for EGD this afternoon depending on availability. -Change IV twice daily PPI to Protonix drip -Monitor H&H.  Transfuse if needed to keep hemoglobin around  7-8. -Continue to hold aspirin and Plavix for now  Risks (bleeding, infection, bowel perforation that could require surgery, sedation-related changes in cardiopulmonary systems), benefits (identification and possible treatment of source of symptoms,  exclusion of certain causes of symptoms), and alternatives (watchful waiting, radiographic imaging studies, empiric medical treatment)  were explained to patient in detail and patient wishes to proceed.   LOS: 1 day   Vernica Wachtel  MD, FACP 02/18/2020, 9:51 AM  Contact #  [phone] 

## 2020-02-19 ENCOUNTER — Encounter (HOSPITAL_COMMUNITY)
Admission: AD | Disposition: A | Discharge status: Skilled Nursing Facility | Payer: Self-pay | Source: Intra-hospital | Attending: Internal Medicine

## 2020-02-19 ENCOUNTER — Inpatient Hospital Stay (HOSPITAL_COMMUNITY): Payer: Medicare Other | Admitting: Certified Registered Nurse Anesthetist

## 2020-02-19 ENCOUNTER — Encounter (HOSPITAL_COMMUNITY): Payer: Self-pay | Admitting: Internal Medicine

## 2020-02-19 ENCOUNTER — Other Ambulatory Visit: Payer: Self-pay | Admitting: Physician Assistant

## 2020-02-19 DIAGNOSIS — R578 Other shock: Secondary | ICD-10-CM | POA: Diagnosis not present

## 2020-02-19 DIAGNOSIS — D62 Acute posthemorrhagic anemia: Secondary | ICD-10-CM

## 2020-02-19 HISTORY — DX: Acute posthemorrhagic anemia: D62

## 2020-02-19 HISTORY — PX: BIOPSY: SHX5522

## 2020-02-19 HISTORY — PX: ESOPHAGOGASTRODUODENOSCOPY (EGD) WITH PROPOFOL: SHX5813

## 2020-02-19 HISTORY — PX: POLYPECTOMY: SHX5525

## 2020-02-19 LAB — CBC
HCT: 22.3 % — ABNORMAL LOW (ref 36.0–46.0)
HCT: 22.8 % — ABNORMAL LOW (ref 36.0–46.0)
HCT: 21.5 % — ABNORMAL LOW (ref 36.0–46.0)
HCT: 20.3 % — ABNORMAL LOW (ref 36.0–46.0)
Hemoglobin: 7.2 g/dL — ABNORMAL LOW (ref 12.0–15.0)
Hemoglobin: 7.4 g/dL — ABNORMAL LOW (ref 12.0–15.0)
Hemoglobin: 7 g/dL — ABNORMAL LOW (ref 12.0–15.0)
Hemoglobin: 6.5 g/dL — CL (ref 12.0–15.0)
MCH: 30 pg (ref 26.0–34.0)
MCH: 30 pg (ref 26.0–34.0)
MCH: 30.2 pg (ref 26.0–34.0)
MCH: 30.5 pg (ref 26.0–34.0)
MCHC: 32.3 g/dL (ref 30.0–36.0)
MCHC: 32.5 g/dL (ref 30.0–36.0)
MCHC: 32.6 g/dL (ref 30.0–36.0)
MCHC: 32 g/dL (ref 30.0–36.0)
MCV: 92.9 fL (ref 80.0–100.0)
MCV: 92.3 fL (ref 80.0–100.0)
MCV: 92.7 fL (ref 80.0–100.0)
MCV: 95.3 fL (ref 80.0–100.0)
Platelets: 181 10*3/uL (ref 150–400)
Platelets: 145 10*3/uL — ABNORMAL LOW (ref 150–400)
Platelets: 142 10*3/uL — ABNORMAL LOW (ref 150–400)
Platelets: 135 10*3/uL — ABNORMAL LOW (ref 150–400)
RBC: 2.4 MIL/uL — ABNORMAL LOW (ref 3.87–5.11)
RBC: 2.47 MIL/uL — ABNORMAL LOW (ref 3.87–5.11)
RBC: 2.32 MIL/uL — ABNORMAL LOW (ref 3.87–5.11)
RBC: 2.13 MIL/uL — ABNORMAL LOW (ref 3.87–5.11)
RDW: 15.2 % (ref 11.5–15.5)
RDW: 15.3 % (ref 11.5–15.5)
RDW: 15.7 % — ABNORMAL HIGH (ref 11.5–15.5)
RDW: 15.9 % — ABNORMAL HIGH (ref 11.5–15.5)
WBC: 28.5 10*3/uL — ABNORMAL HIGH (ref 4.0–10.5)
WBC: 23.7 10*3/uL — ABNORMAL HIGH (ref 4.0–10.5)
WBC: 21.2 10*3/uL — ABNORMAL HIGH (ref 4.0–10.5)
WBC: 17.5 10*3/uL — ABNORMAL HIGH (ref 4.0–10.5)
nRBC: 2.6 % — ABNORMAL HIGH (ref 0.0–0.2)
nRBC: 2.4 % — ABNORMAL HIGH (ref 0.0–0.2)
nRBC: 2.4 % — ABNORMAL HIGH (ref 0.0–0.2)
nRBC: 2.8 % — ABNORMAL HIGH (ref 0.0–0.2)

## 2020-02-19 LAB — GLUCOSE, CAPILLARY
Glucose-Capillary: 193 mg/dL — ABNORMAL HIGH (ref 70–99)
Glucose-Capillary: 157 mg/dL — ABNORMAL HIGH (ref 70–99)
Glucose-Capillary: 159 mg/dL — ABNORMAL HIGH (ref 70–99)
Glucose-Capillary: 146 mg/dL — ABNORMAL HIGH (ref 70–99)
Glucose-Capillary: 111 mg/dL — ABNORMAL HIGH (ref 70–99)
Glucose-Capillary: 102 mg/dL — ABNORMAL HIGH (ref 70–99)

## 2020-02-19 LAB — PROTIME-INR
INR: 2.4 — ABNORMAL HIGH (ref 0.8–1.2)
INR: 1.5 — ABNORMAL HIGH (ref 0.8–1.2)
INR: 1.4 — ABNORMAL HIGH (ref 0.8–1.2)
Prothrombin Time: 25 seconds — ABNORMAL HIGH (ref 11.4–15.2)
Prothrombin Time: 17.7 seconds — ABNORMAL HIGH (ref 11.4–15.2)
Prothrombin Time: 16.5 seconds — ABNORMAL HIGH (ref 11.4–15.2)

## 2020-02-19 LAB — BASIC METABOLIC PANEL
Anion gap: 15 (ref 5–15)
BUN: 124 mg/dL — ABNORMAL HIGH (ref 8–23)
CO2: 21 mmol/L — ABNORMAL LOW (ref 22–32)
Calcium: 8 mg/dL — ABNORMAL LOW (ref 8.9–10.3)
Chloride: 101 mmol/L (ref 98–111)
Creatinine, Ser: 2.44 mg/dL — ABNORMAL HIGH (ref 0.44–1.00)
GFR, Estimated: 19 mL/min — ABNORMAL LOW (ref >60)
Glucose, Bld: 217 mg/dL — ABNORMAL HIGH (ref 70–99)
Potassium: 4.1 mmol/L (ref 3.5–5.1)
Sodium: 137 mmol/L (ref 135–145)

## 2020-02-19 LAB — URINE CULTURE: Culture: <10000 — AB

## 2020-02-19 LAB — MAGNESIUM: Magnesium: 2.4 mg/dL (ref 1.7–2.4)

## 2020-02-19 LAB — PREPARE RBC (CROSSMATCH)

## 2020-02-19 LAB — PHOSPHORUS: Phosphorus: 5 mg/dL — ABNORMAL HIGH (ref 2.5–4.6)

## 2020-02-19 SURGERY — ESOPHAGOGASTRODUODENOSCOPY (EGD) WITH PROPOFOL
Anesthesia: Monitor Anesthesia Care

## 2020-02-19 MED ORDER — SODIUM CHLORIDE 0.9 % IV SOLN: INTRAVENOUS | Status: DC | PRN

## 2020-02-19 MED ORDER — SODIUM CHLORIDE 0.9 % IV SOLN: INTRAVENOUS | Status: DC

## 2020-02-19 MED ORDER — PANTOPRAZOLE SODIUM 40 MG IV SOLR
40.0000 mg | Freq: Two times a day (BID) | INTRAVENOUS | Status: DC
  Administered 2020-02-21 – 2020-02-25 (×9): 40 mg via INTRAVENOUS
  Filled 2020-02-19 (×9): qty 40

## 2020-02-19 MED ORDER — CLOPIDOGREL BISULFATE 75 MG PO TABS: 75.0000 mg | ORAL_TABLET | Freq: Every day | ORAL | Status: DC

## 2020-02-19 MED ORDER — ASPIRIN EC 81 MG PO TBEC: 81.0000 mg | DELAYED_RELEASE_TABLET | Freq: Every day | ORAL | Status: DC

## 2020-02-19 MED ORDER — VITAMIN K1 10 MG/ML IJ SOLN
5.0000 mg | Freq: Once | INTRAVENOUS | Status: AC
  Administered 2020-02-19: 5 mg via INTRAVENOUS
  Filled 2020-02-19: qty 0.5

## 2020-02-19 MED ORDER — PROPOFOL 10 MG/ML IV BOLUS
INTRAVENOUS | Status: DC | PRN
  Administered 2020-02-19 (×2): 10 mg via INTRAVENOUS

## 2020-02-19 MED ORDER — SODIUM CHLORIDE 0.9% FLUSH: 10.0000 mL | INTRAVENOUS | Status: DC | PRN

## 2020-02-19 MED ORDER — SODIUM CHLORIDE 0.9% FLUSH
10.0000 mL | Freq: Two times a day (BID) | INTRAVENOUS | Status: DC
  Administered 2020-02-19 – 2020-03-02 (×22): 10 mL

## 2020-02-19 MED ORDER — OXYBUTYNIN CHLORIDE 5 MG PO TABS: 5.0000 mg | ORAL_TABLET | Freq: Three times a day (TID) | ORAL | Status: DC

## 2020-02-19 MED ORDER — SODIUM CHLORIDE 0.9% IV SOLUTION: Freq: Once | INTRAVENOUS | Status: AC

## 2020-02-19 MED ORDER — ASPIRIN EC 81 MG PO TBEC
81.0000 mg | DELAYED_RELEASE_TABLET | Freq: Every day | ORAL | Status: DC
  Administered 2020-02-21 – 2020-02-22 (×2): 81 mg via ORAL
  Filled 2020-02-19 (×2): qty 1

## 2020-02-19 MED ORDER — SODIUM CHLORIDE 0.9% IV SOLUTION: Freq: Once | INTRAVENOUS | Status: DC

## 2020-02-19 MED ORDER — PROPOFOL 500 MG/50ML IV EMUL
INTRAVENOUS | Status: DC | PRN
  Administered 2020-02-19: 50 ug/kg/min via INTRAVENOUS

## 2020-02-19 MED ORDER — LACTATED RINGERS IV SOLN: INTRAVENOUS | Status: AC

## 2020-02-19 SURGICAL SUPPLY — 15 items

## 2020-02-19 NOTE — Progress Notes (Signed)
PT Cancellation Note  Patient Details Name: [name] MRN: [id number] DOB: 1940/04/28   Cancelled Treatment:    Reason Eval/Treat Not Completed: Patient at procedure or test/unavailable   Eldwin Volkov B Shaili Donalson 02/19/2020, 7:06 AM  Bayard Males, PT Acute Rehabilitation Services Pager: [phone] Office: [phone]

## 2020-02-19 NOTE — Discharge Summary (Signed)
Physician Discharge Summary  Patient ID: [name] MRN: [id number] DOB/AGE: 1940-08-07 79 y.o.  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 FINDINGS: The stomach is gas filled. Nonobstructive pattern of bowel gas with scattered gas and stool present to the rectum. No free air in the abdomen on supine radiographs. Status post left hip total arthroplasty. No radio-opaque calculi or other significant radiographic abnormality are seen. IMPRESSION: The stomach is gas filled. Nonobstructive pattern of bowel gas with scattered gas and stool present to the rectum. No free air in the abdomen on supine radiographs. Electronically Signed   By: Eddie Candle M.D.   On: 02/16/2020 20:59    DG Chest Port 1 View  Result Date: 02/17/2020 CLINICAL DATA:  Respiratory distress.  Hypertension. EXAM: PORTABLE CHEST 1 VIEW COMPARISON:  None. FINDINGS: Stable cardiomegaly. The hila and mediastinum are unchanged. The lungs are clear. No other acute abnormalities. IMPRESSION: Stable cardiomegaly. No focal infiltrates or pulmonary abnormalities identified. Electronically Signed   By: Dorise Bullion III M.D   On: 02/17/2020 12:26     Labs:  Basic Metabolic Panel: Lab 19/37/90 0126 02/14/20 0101 02/15/20 0358 02/16/20 0111 02/17/20 0804  NA 136 138 140 137 137  K 4.1 3.8 3.4* 3.9 4.8  CL 103 103 106 100 103  CO2 24 25 27 29 23   GLUCOSE 177* 196* 180* 185* 219*  BUN 40* 42* 47* 47* 88*  CREATININE 1.52* 1.45* 1.35* 1.39* 1.63*  CALCIUM 8.8* 8.8* 8.5* 8.6* 8.3*  MG  --  2.5*   --   --   --   PHOS  --   --   --   --   --     CBC: Lab 02/16/20 0111 02/16/20 0111 02/17/20 0804  WBC 13.3*   < > 17.6*  NEUTROABS 10.5*  --  14.2*  HGB 8.2*   < > 5.4*  HCT 26.2*   < > 17.5*  MCV 90.0   < > 90.2  PLT 261   < > 326     Brief HPI:   [name] is a 79 y.o. female with history of T2DM with retinopathy and nephropathy, CAD s/p stent, peripheral neuropathy who was admitted on 02/08/2020 after sustaining a fall with onset of left hip pain.  She was found to have acute left subcapital femoral neck fracture as well as acute right frontal lobe SDH with moderate small vessel disease.  [name] was consulted for input and questioned if SDH present as patient without neurological symptoms and no follow-up was recommended.  She underwent left hip bipolar arthroplasty by [name] on 11/14.  Postop on ASA/Plavix for stroke prevention as well as DVT prophylaxis.  Acute blood loss anemia was treated with 1 units PRBC. She had episode of acute hypoxic respiratory failure with DOE requiring venturi mask briefly.  She was treated with IV diuresis and as respiratory status improved, was being weaned off oxygen.  Therapy was ongoing however patient continued be limited by dizziness, fatigue, issues with anxiety and disorientation.  CIR was recommended due to  functional decline.    Hospital Course: Alvah Gilder was admitted to rehab 02/15/2020 for inpatient therapies to consist of PT, ST and OT at least three hours five days a week.  Naprosyn was discontinued at admission as family felt this was causing a lot of GI side effects. Foley was discontinued on a.m. of 11/20 and bladder program initiated.  She reported problems with constipation therefore bowel program was augmented. She did report issues with epigastric and abdominal discomfort felt to be related to constipation.  KUB done showing nonobstructive pattern with evidence of constipation.  Therapy evaluation revealed revealed  the patient required mod assist with mobility and max assist with basic ADL task.  Follow-up labs showed H&H to be stable at 8.2 and 26.2.  She was noted to have fatigue as well as issued with orthostatic hypotension.  On early a.m. of 11/21 she was found to be profoundly hypotensive and was started on IV fluids.  Repeat labs done 11/21 a.m. revealing significant drop in H&H to 5.4/17.5 as well as worsening of renal status with rise in BUN/creatinine to 88/1.63.  Orders written to transfuse patient with 2 units packed red blood cells however due to ongoing issues with hypotension [facility] were consulted to transfer patient to acute service for closer monitoring and further management.   Diet: Carb modified.  Medications at time of discharge: 1.  ASA 81 mg p.o. per day 2.  Urecholine 25 mg p.o. 3 times daily. 3.  Bumex 2 mg p.o. per day 4.  Glucerna supplements 3 times daily 5.  Remeron 50 mg p.o. nightly 6.  MiraLAX 17 g daily as needed 7.  Zofran 4 mg p.o. daily 6 hours as needed 8.  Crestor 20 mg p.o. daily 9.  Mirapex 0.75 mg p.o. nightly 10.  Senna S 2 tabs p.o. twice daily   Discharge disposition: 02-Transferred to [facility]    Signed: Bary Leriche 02/19/2020, 11:17 AM

## 2020-02-19 NOTE — Progress Notes (Addendum)
NAME:  [name], MRN:  [id number], DOB:  Feb 05, 1941, LOS: 2 ADMISSION DATE:  02/17/2020, CONSULTATION DATE:  02/17/2020 REFERRING MD:  Dr. Tamala Julian, CHIEF COMPLAINT:  Hypotension/ ABLA  Brief History   79 year old female previously admitted 11/12 to 11/19 for left hip fracture s/p hemiarthroplasty 11/14 and incidentally found to have subacute CVA and small SDH. Hospital course complicated by ABLA, hypoxia due to volume overload, and urinary retention.  Discharged to CIR, however developed nausea, abdominal/ epigastric pain with hypotension with Hgb drop from 8.2-> 5.4.  Vomited x 1 with questionable aspiration now on [place].  On arrival, ongoing hemodynamic instability, PCCM consulted for further management.   History of present illness   Limited HPI from patient.  Information obtained from medical chart review and from patient's daughter, [name] at bedside.   79 year old female with prior history of CAD s/p stent, CKD stage IV, DMT2, HFpEF, and breast cancer originally admitted 11/12 to 11/19 after mechanical fall with acute left subcapital femoral neck fracture with displacement and angulation; underwent left hip hemiarthroplasty on 11/14.   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  MRI brain done and revealed thin acute right frontal lobe SDH with moderate stable small vessel disease and suspected 5 mm incidental meningioma. 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  No role for surgical intervention per Neurosurgery.  She was managed on ASA/ plavix for stroke prevention.  Post-operatively, was anemic and transfused one unit PRBC on 11/14.  Additionally, hospitalization complicated by brief hypoxia felt due to fluid  overloaded in which resolved with lasix and urinary retention.  She was discharged to CIR on 11/19.  Hemoglobin at discharge was 8.9.    On 11/20, she complained of epigastric and abdominal discomfort without SOB with ongoing constipation.  KUB showed non-obstructive gas pattered with stool present in rectum.  UA negative.  Has been afebrile.  Overnight, she developed hypotension with ongoing abdominal and epigastric pain, nausea, SOB, and with one episode of reported emesis, unclear if bloody.  Post emesis, staff report she had a wet cough with audible rales, now requiring 3L .  Hgb today 5.4, down from 8.2 on 11/20.  She was accepted by [facility] and on arrival, with ongoing SBP in the 70's.  2 units PRBC, CXR, and CT abd/ pelvis ordered.   PCCM consulted for ICU transfer given hemodynamic instability.   Past Medical History  CAD s/p stent, CKD stage IV, DMT2, HFpEF, breast cancer  Significant Hospital Events   11/12- 11/19 hospitalization left hip fx/ subacute CVA/ small SDH.   Consults:  GI  Procedures:   Significant Diagnostic Tests:  CT A/P nothing actionable  Micro Data:   Antimicrobials:   Interim history/subjective:  Continued bleeding overnight.  3 units pRBC, 4 units FFP given. EGD this AM with multiple ulcers, grade D esophagitis, gastric polyps.  Objective   Blood pressure (!) 130/38, pulse 97, temperature 98.6 F (37 C), temperature source Temporal, resp. rate 20, height 5\' 6"  (1.676 m), weight 81.6 kg, SpO2 92 %.        Intake/Output Summary (Last 24 hours) at 02/19/2020 0837 Last data filed at 02/19/2020 0602 Gross per 24 hour  Intake 2511.01 ml  Output 2100 ml  Net 411.01  ml   Filed Weights   02/17/20 1120 02/19/20 0721  Weight: 89.6 kg 81.6 kg    Examination: Constitutional: elderly woman in NAD  Eyes: equal pupils, tracking Ears, nose, mouth, and throat: MM dry Cardiovascular: RRR, ext warm Respiratory: Clear, no accessory muscle use Gastrointestinal:  soft, +BS Skin: No rashes, normal turgor Neurologic: moves all 4 ext to command Psychiatric: RASS 0, fair insight  Hgb 7.2 Cr still elevated   Resolved Hospital Problem list    Assessment & Plan:  Hemorrhagic shock secondary to UGIB looks like esophagitis likely culprit - PPI gtt followed by BID - Trend H/H - Wean pressors - Advance diet to soft - GI has signed off - Resume aspirin 11/25, no strong indication for plavix  Pulmonary nodule- LLL 16mm, consider f/u scan in 12 mo  Hypoxemia related to atelectasis improved with IS - Continue to encourage IS, progressive mobility  Mild AKI on CKD stage IV Urinary retention HFpEF - Give another liter slowly - Continue foley - Trial of oxybutynin  DMT2- A1c 8.8% -SSI, consider increasing levemir -CBG qACHS  Left hip fx s/p repair 11/14 - outpt ortho f/u - PT/ OT when able   Subacute CVA/ small SDH on previous admit -Non-focal -Serial neuro exams -Start start ASA 11/25  Best practice:  Diet: advance to soft diet Pain/Anxiety/Delirium protocol (if indicated): n/a VAP protocol (if indicated): n/a DVT prophylaxis: SCDs GI prophylaxis: PPI Glucose control: see above Mobility: BR for now Code Status: Full Family Communication: updated patient and daughter Disposition: ICU pending pressor liberation   Patient critically ill due to hemorrhagic shock Interventions to address this today transfusions, pressor titration Risk of deterioration without these interventions is high  I personally spent 31 minutes providing critical care not including any separately billable procedures  Erskine Emery MD [facility] 02/19/2020 8:51 AM Personal pager: [phone] If unanswered, please page CCM On-call: [phone]   Erskine Emery MD PCCM

## 2020-02-19 NOTE — Progress Notes (Signed)
eLink Physician-Brief Progress Note Patient Name: [name] DOB: Jul 16, 1940 MRN: [id number]   Date of Service  02/19/2020  HPI/Events of Note  GI Bleed - Hgb = 7.7, however, patient remains on Norepinephrine IV infusions.   eICU Interventions  Plan: 1. Transfuse 1 unit PRBC now.      Intervention Category Major Interventions: Other:;Hemorrhage - evaluation and management  Paxson Harrower Cornelia Copa 02/19/2020, 6:11 AM

## 2020-02-19 NOTE — Transfer of Care (Signed)
Immediate Anesthesia Transfer of Care Note  Patient: [name]  Procedure(s) Performed: ESOPHAGOGASTRODUODENOSCOPY (EGD) WITH PROPOFOL (N/A ) BIOPSY POLYPECTOMY  Patient Location: Endoscopy Unit  Anesthesia Type:MAC  Level of Consciousness: awake and drowsy  Airway & Oxygen Therapy: Patient Spontanous Breathing and Patient connected to nasal cannula oxygen  Post-op Assessment: Report given to RN, Post -op Vital signs reviewed and stable and Patient moving all extremities X 4  Post vital signs: Reviewed and stable  Last Vitals:  Vitals Value Taken Time  BP    Temp    Pulse 97 02/19/20 0803  Resp 32 02/19/20 0803  SpO2 93 % 02/19/20 0803  Vitals shown include unvalidated device data.  Last Pain:  Vitals:   02/19/20 0721  TempSrc: Temporal  PainSc: 3       Patients Stated Pain Goal: 3 (44/96/75 9163)  Complications: No complications documented.

## 2020-02-19 NOTE — IPOC Note (Signed)
Pt was transferred prior to [facility] being completed.

## 2020-02-19 NOTE — Brief Op Note (Signed)
02/17/2020 - 02/19/2020  8:05 AM  PATIENT:  [name]  79 y.o. female  PRE-OPERATIVE DIAGNOSIS:  GI bleed  POST-OPERATIVE DIAGNOSIS:  Esophagitis,duodenal and gastric ulcer biopsied, gastric polyp removed  PROCEDURE:  Procedure(s): ESOPHAGOGASTRODUODENOSCOPY (EGD) WITH PROPOFOL (N/A) BIOPSY POLYPECTOMY  SURGEON:  Surgeon(s) and Role:    * Kenishia Plack, MD - Primary  Findings ----------- -EGD showed LA grade D distal esophagitis, multiple clean-based gastric ulcer as well as multiple duodenal ulcers.  No evidence of active bleeding.  EGD also showed multiple probably fundic gland gastric polyps.  Biopsies taken.  Recommendations ------------------------ -Okay to start soft diet and advance as tolerated from GI standpoint -Continue Protonix drip for total 48 hours followed by IV twice daily PPI while in the hospital.  Change to p.o. pantoprazole 40 mg twice a day on discharge which should be continued for 8 weeks followed by Protonix 40 mg once a day. -Okay to resume Plavix after 48 hours -Recommend repeat EGD in 3 months to document healing of esophagitis and gastric ulcers -Monitor H&H.  Transfuse to keep hemoglobin around 8.  Avoid NSAIDs. -No further inpatient GI work-up planned.  GI will sign off.  Call us back if needed.  Otis Brace MD, [place] 02/19/2020, 8:08 AM  Contact #  [phone]

## 2020-02-19 NOTE — Op Note (Signed)
[facility] Patient Name: [name] Procedure Date : 02/19/2020 MRN: [id number] Attending MD: Otis Brace , MD Date of Birth: [birth year] CSN: [id number] Age: 79 Admit Type: Inpatient Procedure:                Upper GI endoscopy Indications:              Melena Providers:                Otis Brace, MD, Particia Nearing, RN, William Dalton, Technician, Lesia Sago, Technician Referring MD:              Medicines:                Sedation Administered by an Anesthesia Professional Complications:            No immediate complications. Estimated Blood Loss:     Estimated blood loss was minimal. Procedure:                Pre-Anesthesia Assessment:                           - Prior to the procedure, a History and Physical                            was performed, and patient medications and                            allergies were reviewed. The patient's tolerance of                            previous anesthesia was also reviewed. The risks                            and benefits of the procedure and the sedation                            options and risks were discussed with the patient.                            All questions were answered, and informed consent                            was obtained. Prior Anticoagulants: The patient has                            taken Plavix (clopidogrel), last dose was 4 days                            prior to procedure. ASA Grade Assessment: III - A                            patient with severe systemic disease. After  reviewing the risks and benefits, the patient was                            deemed in satisfactory condition to undergo the                            procedure.                           After obtaining informed consent, the endoscope was                            passed under direct vision. Throughout the                            procedure, the  patient's blood pressure, pulse, and                            oxygen saturations were monitored continuously. The                            GIF-H190 ([id number]) Olympus gastroscope was                            introduced through the mouth, and advanced to the                            second part of duodenum. The upper GI endoscopy was                            accomplished without difficulty. The patient                            tolerated the procedure well. Scope In: Scope Out: Findings:      LA Grade D (one or more mucosal breaks involving at least 75% of       esophageal circumference) esophagitis with no bleeding was found in the       distal esophagus.      Many non-bleeding superficial gastric ulcers with no stigmata of       bleeding were found in the gastric antrum and in the prepyloric region       of the stomach. The largest lesion was 6 mm in largest dimension.       Biopsies were taken with a cold forceps for histology.      Multiple sessile polyps were found in the gastric body. Biopsies were       taken with a cold forceps for histology.      The cardia and gastric fundus were normal on retroflexion.      Many non-bleeding superficial duodenal ulcers with no stigmata of       bleeding were found in the duodenal bulb, in the first portion of the       duodenum and in the second portion of the duodenum. The largest lesion       was 12 mm in largest dimension. Biopsies were taken with a cold forceps  for histology. Impression:               - LA Grade D esophagitis with no bleeding.                           - Non-bleeding gastric ulcers with no stigmata of                            bleeding. Biopsied.                           - Multiple gastric polyps. Biopsied.                           - Non-bleeding duodenal ulcers with no stigmata of                            bleeding. Biopsied. Recommendation:           - Return patient to hospital ward for ongoing  care.                           - Resume previous diet.                           - Continue present medications.                           - Await pathology results.                           - Repeat upper endoscopy in 3 months to check                            healing.                           - Return to GI office in 2 months. Procedure Code(s):        --- Professional ---                           [phone], Esophagogastroduodenoscopy, flexible,                            transoral; with biopsy, single or multiple Diagnosis Code(s):        --- Professional ---                           K20.90, Esophagitis, unspecified without bleeding                           K25.9, Gastric ulcer, unspecified as acute or                            chronic, without hemorrhage or perforation                           K31.7, Polyp  of stomach and duodenum                           K26.9, Duodenal ulcer, unspecified as acute or                            chronic, without hemorrhage or perforation                           K92.1, Melena (includes Hematochezia) CPT copyright 2019 American Medical Association. All rights reserved. The codes documented in this report are preliminary and upon coder review may  be revised to meet current compliance requirements. Otis Brace, MD Otis Brace, MD 02/19/2020 8:04:59 AM Number of Addenda: 0

## 2020-02-19 NOTE — Anesthesia Procedure Notes (Signed)
Procedure Name: MAC Date/Time: 02/19/2020 7:45 AM Performed by: Harden Mo, CRNA Pre-anesthesia Checklist: Patient identified, Emergency Drugs available, Suction available and Patient being monitored Patient Re-evaluated:Patient Re-evaluated prior to induction Oxygen Delivery Method: Nasal cannula Preoxygenation: Pre-oxygenation with 100% oxygen Induction Type: IV induction Placement Confirmation: positive ETCO2 and breath sounds checked- equal and bilateral Dental Injury: Teeth and Oropharynx as per pre-operative assessment

## 2020-02-19 NOTE — Interval H&P Note (Signed)
History and Physical Interval Note:  02/19/2020 7:30 AM  [name]  has presented today for surgery, with the diagnosis of GI bleed.  The various methods of treatment have been discussed with the patient and family. After consideration of risks, benefits and other options for treatment, the patient has consented to  Procedure(s): ESOPHAGOGASTRODUODENOSCOPY (EGD) WITH PROPOFOL (N/A) as a surgical intervention.  The patient's history has been reviewed, patient examined, no change in status, stable for surgery.  I have reviewed the patient's chart and labs.  Questions were answered to the patient's satisfaction.     Kamiya Acord

## 2020-02-19 NOTE — Anesthesia Preprocedure Evaluation (Addendum)
Anesthesia Evaluation  Patient identified by MRN, date of birth, ID band Patient awake    Reviewed: Allergy & Precautions, NPO status , Patient's Chart, lab work & pertinent test results, reviewed documented beta blocker date and time   Airway Mallampati: II  TM Distance: >3 FB Neck ROM: Full    Dental  (+) Edentulous Lower, Edentulous Upper, Dental Advisory Given   Pulmonary neg pulmonary ROS,    Pulmonary exam normal breath sounds clear to auscultation       Cardiovascular hypertension, Pt. on home beta blockers + CAD and +CHF   Rhythm:Regular Rate:Tachycardia  NE infusion   Neuro/Psych  Headaches, PSYCHIATRIC DISORDERS Anxiety Dementia TIACVA    GI/Hepatic Neg liver ROS, GERD  Medicated,GI Bleed    Endo/Other  diabetesObesity   Renal/GU Renal disease     Musculoskeletal  (+) Arthritis ,   Abdominal   Peds  Hematology  (+) Blood dyscrasia (Plavix), ,   Anesthesia Other Findings Day of surgery medications reviewed with the patient.  Reproductive/Obstetrics                          Anesthesia Physical Anesthesia Plan  ASA: III  Anesthesia Plan: MAC   Post-op Pain Management:    Induction: Intravenous  PONV Risk Score and Plan: 2 and Propofol infusion and Treatment may vary due to age or medical condition  Airway Management Planned: Nasal Cannula and Natural Airway  Additional Equipment:   Intra-op Plan:   Post-operative Plan:   Informed Consent: I have reviewed the patients History and Physical, chart, labs and discussed the procedure including the risks, benefits and alternatives for the proposed anesthesia with the patient or authorized representative who has indicated his/her understanding and acceptance.       Plan Discussed with: CRNA and Anesthesiologist  Anesthesia Plan Comments:         Anesthesia Quick Evaluation

## 2020-02-19 NOTE — Progress Notes (Signed)
eLink Physician-Brief Progress Note Patient Name: [name] DOB: [birth year] MRN: [id number]   Date of Service  02/19/2020  HPI/Events of Note  Notified of hemoglobin Transfused early this AM and now with slow drop Stable BP and no overt bleeding endorsed  eICU Interventions  1 unit RBC to be transfused, d/w RN and please call with repeat labs      Intervention Category Major Interventions: Hemorrhage - evaluation and management  Margaretmary Lombard 02/19/2020, 11:59 PM

## 2020-02-19 NOTE — Progress Notes (Signed)
E Link notified HGB 6.5

## 2020-02-20 ENCOUNTER — Encounter (HOSPITAL_COMMUNITY): Payer: Self-pay | Admitting: Gastroenterology

## 2020-02-20 ENCOUNTER — Ambulatory Visit: Payer: Self-pay | Admitting: *Deleted

## 2020-02-20 LAB — BASIC METABOLIC PANEL
Anion gap: 9 (ref 5–15)
BUN: 82 mg/dL — ABNORMAL HIGH (ref 8–23)
CO2: 24 mmol/L (ref 22–32)
Calcium: 8 mg/dL — ABNORMAL LOW (ref 8.9–10.3)
Chloride: 106 mmol/L (ref 98–111)
Creatinine, Ser: 1.95 mg/dL — ABNORMAL HIGH (ref 0.44–1.00)
GFR, Estimated: 25 mL/min — ABNORMAL LOW (ref >60)
Glucose, Bld: 110 mg/dL — ABNORMAL HIGH (ref 70–99)
Potassium: 3 mmol/L — ABNORMAL LOW (ref 3.5–5.1)
Sodium: 139 mmol/L (ref 135–145)

## 2020-02-20 LAB — CBC
HCT: 25.8 % — ABNORMAL LOW (ref 36.0–46.0)
HCT: 25 % — ABNORMAL LOW (ref 36.0–46.0)
HCT: 24.4 % — ABNORMAL LOW (ref 36.0–46.0)
Hemoglobin: 8 g/dL — ABNORMAL LOW (ref 12.0–15.0)
Hemoglobin: 7.8 g/dL — ABNORMAL LOW (ref 12.0–15.0)
Hemoglobin: 7.7 g/dL — ABNORMAL LOW (ref 12.0–15.0)
MCH: 29.4 pg (ref 26.0–34.0)
MCH: 29.7 pg (ref 26.0–34.0)
MCH: 29.7 pg (ref 26.0–34.0)
MCHC: 31 g/dL (ref 30.0–36.0)
MCHC: 31.2 g/dL (ref 30.0–36.0)
MCHC: 31.6 g/dL (ref 30.0–36.0)
MCV: 94.9 fL (ref 80.0–100.0)
MCV: 95.1 fL (ref 80.0–100.0)
MCV: 94.2 fL (ref 80.0–100.0)
Platelets: 145 10*3/uL — ABNORMAL LOW (ref 150–400)
Platelets: 151 10*3/uL (ref 150–400)
Platelets: 148 10*3/uL — ABNORMAL LOW (ref 150–400)
RBC: 2.72 MIL/uL — ABNORMAL LOW (ref 3.87–5.11)
RBC: 2.63 MIL/uL — ABNORMAL LOW (ref 3.87–5.11)
RBC: 2.59 MIL/uL — ABNORMAL LOW (ref 3.87–5.11)
RDW: 15.9 % — ABNORMAL HIGH (ref 11.5–15.5)
RDW: 16.1 % — ABNORMAL HIGH (ref 11.5–15.5)
RDW: 16.1 % — ABNORMAL HIGH (ref 11.5–15.5)
WBC: 17.4 10*3/uL — ABNORMAL HIGH (ref 4.0–10.5)
WBC: 16.5 10*3/uL — ABNORMAL HIGH (ref 4.0–10.5)
WBC: 15.9 10*3/uL — ABNORMAL HIGH (ref 4.0–10.5)
nRBC: 2.6 % — ABNORMAL HIGH (ref 0.0–0.2)
nRBC: 2.4 % — ABNORMAL HIGH (ref 0.0–0.2)
nRBC: 1.9 % — ABNORMAL HIGH (ref 0.0–0.2)

## 2020-02-20 LAB — BPAM FFP
Blood Product Expiration Date: 202111242359
Blood Product Expiration Date: 202111252359
Blood Product Expiration Date: 202111252359
Blood Product Expiration Date: 202111252359
ISSUE DATE / TIME: 202111230106
ISSUE DATE / TIME: 202111230106
ISSUE DATE / TIME: 202111230106
ISSUE DATE / TIME: 202111230106
Unit Type and Rh: 600
Unit Type and Rh: 6200
Unit Type and Rh: 6200
Unit Type and Rh: 6200

## 2020-02-20 LAB — PREPARE FRESH FROZEN PLASMA
Unit division: 0
Unit division: 0
Unit division: 0

## 2020-02-20 LAB — GLUCOSE, CAPILLARY
Glucose-Capillary: 106 mg/dL — ABNORMAL HIGH (ref 70–99)
Glucose-Capillary: 129 mg/dL — ABNORMAL HIGH (ref 70–99)
Glucose-Capillary: 118 mg/dL — ABNORMAL HIGH (ref 70–99)
Glucose-Capillary: 123 mg/dL — ABNORMAL HIGH (ref 70–99)
Glucose-Capillary: 115 mg/dL — ABNORMAL HIGH (ref 70–99)

## 2020-02-20 LAB — TROPONIN I (HIGH SENSITIVITY)
Troponin I (High Sensitivity): 22 ng/L — ABNORMAL HIGH (ref <18)
Troponin I (High Sensitivity): 22 ng/L — ABNORMAL HIGH (ref <18)

## 2020-02-20 LAB — MAGNESIUM: Magnesium: 2.6 mg/dL — ABNORMAL HIGH (ref 1.7–2.4)

## 2020-02-20 LAB — PREPARE RBC (CROSSMATCH)

## 2020-02-20 LAB — PHOSPHORUS: Phosphorus: 3.5 mg/dL (ref 2.5–4.6)

## 2020-02-20 LAB — HEMOGLOBIN AND HEMATOCRIT, BLOOD
HCT: 24.4 % — ABNORMAL LOW (ref 36.0–46.0)
Hemoglobin: 7.7 g/dL — ABNORMAL LOW (ref 12.0–15.0)

## 2020-02-20 LAB — SURGICAL PATHOLOGY

## 2020-02-20 MED ORDER — LACTATED RINGERS IV SOLN: INTRAVENOUS | Status: DC

## 2020-02-20 MED ORDER — GLUCERNA SHAKE PO LIQD
237.0000 mL | Freq: Three times a day (TID) | ORAL | Status: DC
  Administered 2020-02-21 – 2020-03-01 (×21): 237 mL via ORAL

## 2020-02-20 MED ORDER — WITCH HAZEL-GLYCERIN EX PADS
MEDICATED_PAD | CUTANEOUS | Status: DC | PRN
  Filled 2020-02-20: qty 100

## 2020-02-20 MED ORDER — HYDROCORTISONE ACETATE 25 MG RE SUPP
25.0000 mg | Freq: Once | RECTAL | Status: AC
  Administered 2020-02-20: 25 mg via RECTAL
  Filled 2020-02-20: qty 1

## 2020-02-20 MED ORDER — POTASSIUM CHLORIDE CRYS ER 20 MEQ PO TBCR
40.0000 meq | EXTENDED_RELEASE_TABLET | Freq: Once | ORAL | Status: AC
  Administered 2020-02-20: 40 meq via ORAL
  Filled 2020-02-20: qty 2

## 2020-02-20 MED ORDER — HYDROCORTISONE ACETATE 25 MG RE SUPP
25.0000 mg | Freq: Two times a day (BID) | RECTAL | Status: DC | PRN
  Filled 2020-02-20: qty 1

## 2020-02-20 MED ORDER — INSULIN ASPART 100 UNIT/ML ~~LOC~~ SOLN
0.0000 [IU] | SUBCUTANEOUS | Status: DC
  Administered 2020-02-20 – 2020-02-21 (×2): 1 [IU] via SUBCUTANEOUS

## 2020-02-20 NOTE — Progress Notes (Signed)
eLink Physician-Brief Progress Note Patient Name: [name] DOB: 19-Jun-1940 MRN: [id number]   Date of Service  02/20/2020  HPI/Events of Note  Notified of K 3.0 Creatinine 1.95 improving  eICU Interventions  Ordered K 40 meqs PO     Intervention Category Minor Interventions: Electrolytes abnormality - evaluation and management  Judd Lien 02/20/2020, 6:34 AM

## 2020-02-20 NOTE — Progress Notes (Signed)
  Speech Language Pathology  Patient Details Name: [name] MRN: [id number] DOB: [birth year] Today's Date: 02/20/2020 Time:  -            Diet has been upgraded to regular by MD. RN and pt both state she has had no difficulty eating/drinking- no further ST needed. No charge for brief visit.       Houston Siren 02/20/2020, 5:00 PM   Orbie Pyo Colvin Caroli.Ed Risk analyst [phone] Office [phone]

## 2020-02-20 NOTE — Progress Notes (Signed)
TRH pick up for 11/25

## 2020-02-20 NOTE — Progress Notes (Signed)
NAME:  [name], MRN:  [id number], DOB:  [birth year], LOS: 3 ADMISSION DATE:  02/17/2020, CONSULTATION DATE:  02/17/2020 REFERRING MD:  Dr. Tamala Julian, CHIEF COMPLAINT:  Hypotension/ ABLA  Brief History   79 year old female previously admitted 11/12 to 11/19 for left hip fracture s/p hemiarthroplasty 11/14 and incidentally found to have subacute CVA and small SDH. Hospital course complicated by ABLA, hypoxia due to volume overload, and urinary retention.  Discharged to CIR, however developed nausea, abdominal/ epigastric pain with hypotension with Hgb drop from 8.2-> 5.4.  Vomited x 1 with questionable aspiration now on [place].  On arrival, ongoing hemodynamic instability, PCCM consulted for further management.   History of present illness   Limited HPI from patient.  Information obtained from medical chart review and from patient's daughter, [name] at bedside.   79 year old female with prior history of CAD s/p stent, CKD stage IV, DMT2, HFpEF, and breast cancer originally admitted 11/12 to 11/19 after mechanical fall with acute left subcapital femoral neck fracture with displacement and angulation; underwent left hip hemiarthroplasty on 11/14.   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  MRI brain done and revealed thin acute right frontal lobe SDH with moderate stable small vessel disease and suspected 5 mm incidental meningioma. 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  No role for surgical intervention per Neurosurgery.  She was managed on ASA/ plavix for stroke prevention.  Post-operatively, was anemic and transfused one unit PRBC on 11/14.  Additionally, hospitalization complicated by brief hypoxia felt due to fluid  overloaded in which resolved with lasix and urinary retention.  She was discharged to CIR on 11/19.  Hemoglobin at discharge was 8.9.    On 11/20, she complained of epigastric and abdominal discomfort without SOB with ongoing constipation.  KUB showed non-obstructive gas pattered with stool present in rectum.  UA negative.  Has been afebrile.  Overnight, she developed hypotension with ongoing abdominal and epigastric pain, nausea, SOB, and with one episode of reported emesis, unclear if bloody.  Post emesis, staff report she had a wet cough with audible rales, now requiring 3L [place].  Hgb today 5.4, down from 8.2 on 11/20.  She was accepted by [facility] and on arrival, with ongoing SBP in the 70's.  2 units PRBC, CXR, and CT abd/ pelvis ordered.   PCCM consulted for ICU transfer given hemodynamic instability.   Past Medical History  CAD s/p stent, CKD stage IV, DMT2, HFpEF, breast cancer  Significant Hospital Events   11/12- 11/19 hospitalization left hip fx/ subacute CVA/ small SDH.   Consults:  GI  Procedures:   Significant Diagnostic Tests:  CT A/P nothing actionable  Micro Data:   Antimicrobials:   Interim history/subjective:  Received 1 unit yesterday. Off pressors.  Objective   Blood pressure (!) 151/67, pulse (!) 103, temperature (!) 97.5 F (36.4 C), temperature source Oral, resp. rate (!) 22, height 5\' 6"  (1.676 m), weight 90 kg, SpO2 98 %.        Intake/Output Summary (Last 24 hours) at 02/20/2020 0840 Last data filed at 02/20/2020 0800 Gross per 24 hour  Intake 1850.95 ml  Output 2240 ml  Net -389.05 ml   Filed Weights   02/17/20 1120 02/19/20 0721 02/20/20 0447  Weight: 89.6 kg 81.6 kg 90 kg    Examination: Constitutional: pleasant elderly woman in NAD  Eyes: equal pupils, tracking Ears, nose, mouth, and throat: MMM, trachea midline Cardiovascular: Slightly tachycardic, ext warm Respiratory: Clear, no wheezing Gastrointestinal: Soft, +BS Skin: No rashes, normal  turgor Neurologic: Moves all 4 ext to command, globally weak Psychiatric: RASS 0, fair insight  H/h 6.5>>7.7 after transfusion    Resolved Hospital Problem list    Assessment & Plan:  Hemorrhagic shock secondary to UGIB looks like esophagitis likely culprit - Advance diet as tolerated - Resume aspirin 11/25, no strong indication for plavix  Pulmonary nodule- LLL 35mm, consider f/u scan in 12 mo  Hypoxemia related to atelectasis improved with IS - Continue to encourage IS, progressive mobility, improving  Mild AKI on CKD stage IV Urinary retention due to abnormal urethral anatomy HFpEF - Continue gentle IVF - Want to see Cr settle out a bit before another voiding trial  DMT2- A1c 8.8%, better controlled now on levemir, SSI -SSI, levemir -CBG qACHS  Left hip fx s/p repair 11/14 - outpt ortho f/u - PT/ OT when able   Subacute CVA/ small SDH on previous admit, some question regarding whether this was actually a stroke -Non-focal -Serial neuro exams -start ASA 11/25 (assuming no further H/H drop), if recurrent GIB would just stop  Best practice:  Diet: advance to diabetic Pain/Anxiety/Delirium protocol (if indicated): n/a VAP protocol (if indicated): n/a DVT prophylaxis: SCDs GI prophylaxis: PPI Glucose control: see above Mobility: Up to chair Code Status: Full Family Communication: updated patient Disposition: PCU, appreciate TRH taking over care, remaining issues include (A) assuring H/H stability with aspirin (B) PT/OT evaluation (C) getting foley out, (D) continued creatinine improvement  Erskine Emery MD [facility] 02/20/2020 8:40 AM Personal pager: [phone] If unanswered, please page CCM On-call: [phone]   Erskine Emery MD PCCM

## 2020-02-20 NOTE — Progress Notes (Signed)
C/o some sternal dull pain Mildly reproducible on palpation No radiation Suspect related to esophagitis, trial of maalox, check EKG given age and gender as they have unusual ACS presentations.  If no resolution can consider cycling troponins.   Erskine Emery MD PCCM

## 2020-02-20 NOTE — Anesthesia Postprocedure Evaluation (Signed)
Anesthesia Post Note  Patient: [name]  Procedure(s) Performed: ESOPHAGOGASTRODUODENOSCOPY (EGD) WITH PROPOFOL (N/A ) BIOPSY POLYPECTOMY     Patient location during evaluation: Endoscopy Anesthesia Type: MAC Level of consciousness: awake and alert Pain management: pain level controlled Vital Signs Assessment: post-procedure vital signs reviewed and stable Respiratory status: spontaneous breathing, nonlabored ventilation, respiratory function stable and patient connected to nasal cannula oxygen Cardiovascular status: stable and blood pressure returned to baseline Postop Assessment: no apparent nausea or vomiting Anesthetic complications: no   No complications documented.  Last Vitals:  Vitals:   02/20/20 0615 02/20/20 0630  BP:  (!) 136/48  Pulse: 91 90  Resp: (!) 29 (!) 28  Temp:    SpO2: 97% 97%    Last Pain:  Vitals:   02/20/20 0400  TempSrc:   PainSc: 0-No pain                 Catalina Gravel

## 2020-02-20 NOTE — Progress Notes (Signed)
Physical Therapy Treatment Patient Details Name: [name] MRN: [id number] DOB: Mar 26, 1941 Today's Date: 02/20/2020    History of Present Illness 79 y.o. female admitted back to the hospital from inpatient rehab on 02/17/20 secondary to acute blood loss anemia and hemodynamic instability.  Dx with hemorrhagic shock secondary to UGIB (esophagitis likely cause), hypoxemia related to atelectasis, mild AKI.  Pt with recent PMH of subacute CVA/ small subdural hematoma, L hip fx s/p repair on 02/10/20 (WBAT with posterior precautions), peripheral neuropathy, gout, HTN, dementia/mild cognitive impairment, DM, CAD, CKD, Charcot's Bil feet, BreastCA bil s/p bil lumpectomy, R ankle fusion 01/2018.     PT Comments    Pt bed level session today as her bottom (which daughter reports is "raw") was too painful to scoot (sheering) to EOB.  She did participate fully in bed level exercises and being positioned slowly into max chair mode.  Distracted by her discomfort by talking with her daughter about kids and grand kids. PT will continue to follow acutely for safe mobility progression.   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 Restrictions LLE Weight Bearing: Weight bearing as tolerated    Mobility  Bed Mobility                  Transfers                    Ambulation/Gait                 Stairs             Wheelchair Mobility    Modified Rankin (Stroke  Patients Only)       Balance Overall balance assessment: Needs assistance     Sitting balance - Comments: Sat pt up with bed in chair mode (max) worked on tolerance of being upright, upper and LE exercises.  BP soft, but stable.                                     Cognition Arousal/Alertness: Awake/alert Behavior During Therapy: WFL for tasks assessed/performed Overall Cognitive Status: History of cognitive impairments - at baseline Area of Impairment: Memory                     Memory: Decreased short-term memory Following Commands: Follows one step commands consistently       General Comments: Pt has listed h/o dementia in chart.  Daughter present and helping her remember family's names.       Exercises General Exercises - Upper Extremity Shoulder Flexion: AROM;AAROM;Right;Left;10 reps Elbow Flexion: AROM;AAROM;Right;Left;10 reps General Exercises - Lower Extremity Ankle Circles/Pumps: AROM;AAROM;Right;Left;10 reps Short Arc Quad: AROM;AAROM;Right;Left;10 reps Hip ABduction/ADduction: AROM;AAROM;Both;10 reps Hip Flexion/Marching: AROM;10 reps;Right    General Comments        Pertinent Vitals/Pain Pain Assessment: Faces Faces Pain Scale: Hurts even more Pain Location: Lft hip with movement and bottom due to frequent stools Pain Descriptors / Indicators: Grimacing;Guarding Pain Intervention(s): Limited activity  within patient's tolerance;Monitored during session;Repositioned    Home Living                      Prior Function            PT Goals (current goals can now be found in the care plan section) Acute Rehab PT Goals Patient Stated Goal: to go home Progress towards PT goals: Not progressing toward goals - comment (limited by raw bottom)    Frequency    Min 3X/week      PT Plan Current plan remains appropriate    Co-evaluation              AM-PAC PT "6 Clicks" Mobility   Outcome Measure  Help needed  turning from your back to your side while in a flat bed without using bedrails?: Total Help needed moving from lying on your back to sitting on the side of a flat bed without using bedrails?: Total Help needed moving to and from a bed to a chair (including a wheelchair)?: Total Help needed standing up from a chair using your arms (e.g., wheelchair or bedside chair)?: Total Help needed to walk in hospital room?: Total Help needed climbing 3-5 steps with a railing? : Total 6 Click Score: 6    End of Session   Activity Tolerance: Patient limited by fatigue;Patient limited by pain Patient left: in bed;with call bell/phone within reach;with family/visitor present Nurse Communication: Mobility status PT Visit Diagnosis: Muscle weakness (generalized) (M62.81);Pain;Other abnormalities of gait and mobility (R26.89);Unsteadiness on feet (R26.81);Difficulty in walking, not elsewhere classified (R26.2) Pain - Right/Left: Left Pain - part of body: Hip     Time: 3846-6599 PT Time Calculation (min) (ACUTE ONLY): 38 min  Charges:  $Therapeutic Exercise: 38-52 mins                     Verdene Lennert, PT, DPT  Acute Rehabilitation [phone] pager [phone]) [phone] office

## 2020-02-21 DIAGNOSIS — N179 Acute kidney failure, unspecified: Secondary | ICD-10-CM

## 2020-02-21 DIAGNOSIS — N184 Chronic kidney disease, stage 4 (severe): Secondary | ICD-10-CM

## 2020-02-21 DIAGNOSIS — R911 Solitary pulmonary nodule: Secondary | ICD-10-CM

## 2020-02-21 DIAGNOSIS — Z8673 Personal history of transient ischemic attack (TIA), and cerebral infarction without residual deficits: Secondary | ICD-10-CM

## 2020-02-21 DIAGNOSIS — J9601 Acute respiratory failure with hypoxia: Secondary | ICD-10-CM

## 2020-02-21 DIAGNOSIS — Z8781 Personal history of (healed) traumatic fracture: Secondary | ICD-10-CM

## 2020-02-21 DIAGNOSIS — N1832 Chronic kidney disease, stage 3b: Secondary | ICD-10-CM

## 2020-02-21 HISTORY — DX: Acute kidney failure, unspecified: N18.4

## 2020-02-21 HISTORY — DX: Acute respiratory failure with hypoxia: J96.01

## 2020-02-21 HISTORY — DX: Chronic kidney disease, stage 4 (severe): N17.9

## 2020-02-21 LAB — CBC
HCT: 25.4 % — ABNORMAL LOW (ref 36.0–46.0)
HCT: 25.3 % — ABNORMAL LOW (ref 36.0–46.0)
Hemoglobin: 8 g/dL — ABNORMAL LOW (ref 12.0–15.0)
Hemoglobin: 7.8 g/dL — ABNORMAL LOW (ref 12.0–15.0)
MCH: 29.9 pg (ref 26.0–34.0)
MCH: 29.3 pg (ref 26.0–34.0)
MCHC: 31.5 g/dL (ref 30.0–36.0)
MCHC: 30.8 g/dL (ref 30.0–36.0)
MCV: 94.8 fL (ref 80.0–100.0)
MCV: 95.1 fL (ref 80.0–100.0)
Platelets: 154 10*3/uL (ref 150–400)
Platelets: 171 10*3/uL (ref 150–400)
RBC: 2.68 MIL/uL — ABNORMAL LOW (ref 3.87–5.11)
RBC: 2.66 MIL/uL — ABNORMAL LOW (ref 3.87–5.11)
RDW: 15.9 % — ABNORMAL HIGH (ref 11.5–15.5)
RDW: 15.7 % — ABNORMAL HIGH (ref 11.5–15.5)
WBC: 13.2 10*3/uL — ABNORMAL HIGH (ref 4.0–10.5)
WBC: 14 10*3/uL — ABNORMAL HIGH (ref 4.0–10.5)
nRBC: 0.9 % — ABNORMAL HIGH (ref 0.0–0.2)
nRBC: 0.8 % — ABNORMAL HIGH (ref 0.0–0.2)

## 2020-02-21 LAB — PHOSPHORUS: Phosphorus: 2.4 mg/dL — ABNORMAL LOW (ref 2.5–4.6)

## 2020-02-21 LAB — GLUCOSE, CAPILLARY
Glucose-Capillary: 108 mg/dL — ABNORMAL HIGH (ref 70–99)
Glucose-Capillary: 112 mg/dL — ABNORMAL HIGH (ref 70–99)
Glucose-Capillary: 129 mg/dL — ABNORMAL HIGH (ref 70–99)
Glucose-Capillary: 133 mg/dL — ABNORMAL HIGH (ref 70–99)
Glucose-Capillary: 142 mg/dL — ABNORMAL HIGH (ref 70–99)
Glucose-Capillary: 165 mg/dL — ABNORMAL HIGH (ref 70–99)
Glucose-Capillary: 92 mg/dL (ref 70–99)
Glucose-Capillary: 93 mg/dL (ref 70–99)

## 2020-02-21 LAB — BASIC METABOLIC PANEL
Anion gap: 9 (ref 5–15)
BUN: 55 mg/dL — ABNORMAL HIGH (ref 8–23)
CO2: 23 mmol/L (ref 22–32)
Calcium: 8 mg/dL — ABNORMAL LOW (ref 8.9–10.3)
Chloride: 107 mmol/L (ref 98–111)
Creatinine, Ser: 1.61 mg/dL — ABNORMAL HIGH (ref 0.44–1.00)
GFR, Estimated: 32 mL/min — ABNORMAL LOW (ref >60)
Glucose, Bld: 117 mg/dL — ABNORMAL HIGH (ref 70–99)
Potassium: 3.4 mmol/L — ABNORMAL LOW (ref 3.5–5.1)
Sodium: 139 mmol/L (ref 135–145)

## 2020-02-21 LAB — MAGNESIUM: Magnesium: 2.4 mg/dL (ref 1.7–2.4)

## 2020-02-21 MED ORDER — ACETAMINOPHEN 325 MG PO TABS
650.0000 mg | ORAL_TABLET | ORAL | Status: DC | PRN
  Administered 2020-02-25 – 2020-02-29 (×6): 650 mg via ORAL
  Filled 2020-02-21 (×6): qty 2

## 2020-02-21 MED ORDER — INSULIN ASPART 100 UNIT/ML ~~LOC~~ SOLN
0.0000 [IU] | Freq: Three times a day (TID) | SUBCUTANEOUS | Status: DC
  Administered 2020-02-21: 2 [IU] via SUBCUTANEOUS
  Administered 2020-02-21: 1 [IU] via SUBCUTANEOUS
  Administered 2020-02-22 (×3): 2 [IU] via SUBCUTANEOUS
  Administered 2020-02-23: 1 [IU] via SUBCUTANEOUS
  Administered 2020-02-23: 3 [IU] via SUBCUTANEOUS
  Administered 2020-02-24 – 2020-02-25 (×3): 1 [IU] via SUBCUTANEOUS
  Administered 2020-02-25: 2 [IU] via SUBCUTANEOUS
  Administered 2020-02-26: 1 [IU] via SUBCUTANEOUS

## 2020-02-21 MED ORDER — K PHOS MONO-SOD PHOS DI & MONO 155-852-130 MG PO TABS
500.0000 mg | ORAL_TABLET | Freq: Once | ORAL | Status: AC
  Administered 2020-02-21: 500 mg via ORAL
  Filled 2020-02-21: qty 2

## 2020-02-21 NOTE — Progress Notes (Signed)
   02/21/20 2109  Assess: MEWS Score  Temp 99.5 F (37.5 C)  BP (!) 143/67  Pulse Rate (!) 107  ECG Heart Rate (!) 107  Resp 18  Level of Consciousness Alert  SpO2 94 %  O2 Device Nasal Cannula  Patient Activity (if Appropriate) In bed  O2 Flow Rate (L/min) 2 L/min  Assess: MEWS Score  MEWS Temp 0  MEWS Systolic 0  MEWS Pulse 1  MEWS RR 0  MEWS LOC 0  MEWS Score 1  MEWS Score Color Green  Treat  Pain Scale 0-10  Pain Score 0

## 2020-02-21 NOTE — Assessment & Plan Note (Signed)
-   renal function slowly improving - daily BMP

## 2020-02-21 NOTE — Hospital Course (Addendum)
[name] is an 79 year old female notable PMH for CAD s/p stent, CKD stage IV, DMT2, HFpEF, and breast cancer.   Previously admitted 11/12 to 11/19 for left hip fracture s/p hemiarthroplasty 11/14 and incidentally found to have subacute CVA and small SDH.  MRI brain done and revealed thin acute right frontal lobe SDH with moderate stable small vessel disease and suspected 5 mm incidental meningioma. 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  No role for surgical intervention per Neurosurgery.  She was managed on ASA/ plavix for stroke prevention.   Hospital course complicated by ABLA, hypoxia due to volume overload, and urinary retention.  Discharged to CIR on 11/19, however developed nausea, abdominal/ epigastric pain with hypotension with Hgb drop from 8.2-> 5.4.  Vomited x 1 with questionable aspiration and re-admitted to hospital on 11/21.   On 11/20, she complained of epigastric and abdominal discomfort without SOB with ongoing constipation.  KUB showed non-obstructive gas pattered with stool present in rectum.  UA negative.  Has been afebrile.  Overnight, she developed hypotension with ongoing abdominal and epigastric pain, nausea, SOB, and with one episode of reported emesis, unclear if bloody.  Post emesis, staff report she had a wet cough with audible rales, requiring 3L [place].  Hgb 5.4, down from 8.2 on 11/20.  She was accepted by [facility] and on arrival, with ongoing SBP in the 70's.  2 units PRBC, CXR, and CT abd/ pelvis ordered.   PCCM consulted for ICU transfer given hemodynamic instability.   Her hemoglobin stabilized as well as vitals.  She was able to be weaned off of oxygen to room air.  She was transferred out of the ICU on 02/20/2020.  She had been recovering fairly well until the evening of 02/22/2020  when she developed melanotic stools and a significant drop in her hemoglobin from 8 g/dL down to 6.1 g/dL.  She also became hypotensive and tachycardic.  She was started on IV fluids and transfused 3 units PRBC.  GI was reconsulted with plans for repeat EGD on 02/23/2020. EGD found cratered nonbleeding gastric ulcers and 2 large nonbleeding cratered duodenal ulcers, with a large friable vessel in the duodenal bulb.  Intervention on the vessel was not performed as was considered high risk for the patient.  Findings were discussed with patient and her daughter who ultimately elected to have IR evaluation for possible embolization.

## 2020-02-21 NOTE — Assessment & Plan Note (Addendum)
-   s/p repair on 11/14 - continue PT/OT -Now too deconditioned for CIR.  Likely needs SNF

## 2020-02-21 NOTE — Assessment & Plan Note (Addendum)
-  Considered due to atelectasis -Working well with spirometry -Oxygen has been weaned off on 11/25 but placed back on after starting to require multiple transfusions

## 2020-02-21 NOTE — Assessment & Plan Note (Addendum)
-  Suspected due to upper GI bleed from esophagitis (Grade D on EGD 11/23) contributing - s/p PRBC 15 units so far for admission   -Continue trending H/H -Aspirin was resumed on 02/21/2020.  Plavix has been discontinued. Asa back on hold on 11/27 after Hgb drop overnight and black tarry stools; Hgb has not recovered since -GI reconsulted on 02/23/2020 for undergoing repeat EGD to reevaluate for hemoglobin drop - repeat EGD on 11/27 found cratered nonbleeding gastric ulcers and 2 large nonbleeding cratered duodenal ulcers, with a large friable vessel in the duodenal bulb.  Intervention on the vessel was not performed as was considered high risk for the patient.  Findings were discussed with patient and her daughter who ultimately elected to have IR evaluation for possible embolization. IR evaluated and stated no utility for embolization due to not actively bleeding/not seen (recommended tagged red cell scan if active bleed) vs repeat EGD - again discussed with GI on 11/29; unfortunately she ate b'fast but repeat EGD planned for 11/30 unless emergently needed - CLD today and NPO at [place] - continue trending H/H

## 2020-02-21 NOTE — Assessment & Plan Note (Signed)
-   A1c 8.8% -Continue SSI and CBG monitoring

## 2020-02-21 NOTE — Assessment & Plan Note (Signed)
-   LLL 39mm, consider f/u scan in 12 mo

## 2020-02-21 NOTE — Progress Notes (Signed)
PROGRESS NOTE    [name]   JME:[id number]  DOB: [birth year]  DOA: 02/17/2020     4  PCP: [name], DO  CC: GIB  Hospital Course: [name] is an 79 year old female notable PMH for CAD s/p stent, CKD stage IV, DMT2, HFpEF, and breast cancer.   Previously admitted 11/12 to 11/19 for left hip fracture s/p hemiarthroplasty 11/14 and incidentally found to have subacute CVA and small SDH.  MRI brain done and revealed thin acute right frontal lobe SDH with moderate stable small vessel disease and suspected 5 mm incidental meningioma. 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  No role for surgical intervention per Neurosurgery.  She was managed on ASA/ plavix for stroke prevention.   Hospital course complicated by ABLA, hypoxia due to volume overload, and urinary retention.  Discharged to CIR on 11/19, however developed nausea, abdominal/ epigastric pain with hypotension with Hgb drop from 8.2-> 5.4.  Vomited x 1 with questionable aspiration and re-admitted to hospital on 11/21.   On 11/20, she complained of epigastric and abdominal discomfort without SOB with ongoing constipation.  KUB showed non-obstructive gas pattered with stool present in rectum.  UA negative.  Has been afebrile.  Overnight, she developed hypotension with ongoing abdominal and epigastric pain, nausea, SOB, and with one episode of reported emesis, unclear if bloody.  Post emesis, staff report she had a wet cough with audible rales, requiring 3L [place].  Hgb 5.4, down from 8.2 on 11/20.  She was accepted by [facility] and on arrival, with ongoing SBP in the 70's.  2 units PRBC, CXR, and CT abd/ pelvis ordered.   PCCM consulted for ICU transfer given hemodynamic instability.   Her hemoglobin stabilized as well as vitals.  She was able to be  weaned off of oxygen to room air.  She was transferred out of the ICU on 02/20/2020.   Interval History:  Patient transferred out of the ICU on 02/20/2020.  Doing relatively well this morning and resting in bed mostly weak but no distress.  She is motivated to work with physical therapy and regain more strength and get back to rehab. She used incentive spirometer with me bedside and was able to inspire approximately 1000 cc.   Old records reviewed in assessment of this patient  ROS: Constitutional: positive for fatigue and malaise, Respiratory: negative, Cardiovascular: negative for chest pain and Gastrointestinal: negative for abdominal pain  Assessment & Plan: * Hemorrhagic shock (HCC)-resolved as of 02/21/2020 -Suspected due to upper GI bleed from esophagitis (Grade D on EGD 11/23) contributing - s/p PRBC; Hgb now stable ~8 -Continue trending -Now placed back on aspirin on 02/21/2020.  Plavix has been discontinued  S/p left hip fracture - s/p repair on 11/14 - continue PT/OT  Acute respiratory failure with hypoxia (HCC)-resolved as of 02/21/2020 -Considered due to atelectasis -Working well with spirometry -Oxygen has been weaned off on 11/25  History of CVA (cerebrovascular accident) Subacute CVA/ small SDH on previous admit, some question regarding whether this was actually a stroke -Non-focal -start ASA 11/25, if recurrent GIB would just stop  Acute renal failure superimposed on stage 4 chronic kidney disease ([place]) - renal function slowly improving - daily BMP  Pulmonary nodule - LLL 73mm, consider f/u scan in 12 mo  Urinary retention - foley in place;  will need to do a TOV in the next 1-2 days -? 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       Objective: Blood pressure (!) 146/68, pulse 100, temperature 98.2 F (36.8 C), temperature source Oral, resp. rate 20, height 5\' 8"  (1.727 m), weight 94.3 kg, SpO2 98 %.  Examination: General appearance: alert, cooperative and no distress Head: Normocephalic, without obvious abnormality, atraumatic Eyes: EOMI Lungs: clear to auscultation bilaterally Heart: regular rate and rhythm and S1, S2 normal Abdomen: normal findings: bowel sounds normal and soft, non-tender Extremities: 3+ bilateral lower extremity edema Skin: normal Neurologic: Grossly normal.  Global weakness but no focal deficits  Consultants:     Procedures:     Data Reviewed: I have personally reviewed following labs and imaging studies Results for orders placed or performed during the hospital encounter of 02/17/20 (from the past 24 hour(s))  Troponin I (High Sensitivity)     Status: Abnormal   Collection Time: 02/20/20  1:17 PM  Result Value Ref Range   Troponin I (High Sensitivity) 22 (H) <18 ng/L  CBC     Status: Abnormal   Collection Time: 02/20/20  2:14 PM  Result Value Ref Range   WBC 16.5 (H) 4.0 - 10.5 K/uL   RBC 2.63 (L) 3.87 - 5.11 MIL/uL   Hemoglobin 7.8 (L) 12.0 - 15.0 g/dL   HCT 25.0 (L) 36 - 46 %   MCV 95.1 80.0 - 100.0 fL   MCH 29.7 26.0 - 34.0 pg   MCHC 31.2 30.0 - 36.0 g/dL   RDW 16.1 (H) 11.5 - 15.5 %   Platelets 151 150 - 400 K/uL   nRBC 2.4 (H) 0.0 - 0.2 %  Glucose, capillary     Status: Abnormal   Collection Time: 02/20/20  4:03 PM  Result Value Ref Range   Glucose-Capillary 123 (H)  70 - 99 mg/dL  Glucose, capillary     Status: Abnormal   Collection Time: 02/20/20  7:08 PM  Result Value Ref Range   Glucose-Capillary 115 (H) 70 - 99 mg/dL  CBC     Status: Abnormal   Collection Time: 02/20/20 10:37 PM  Result Value Ref Range   WBC 15.9 (H) 4.0 - 10.5 K/uL   RBC 2.59 (L) 3.87 - 5.11 MIL/uL   Hemoglobin 7.7 (L) 12.0 - 15.0 g/dL   HCT 24.4 (L) 36 - 46 %   MCV 94.2 80.0 - 100.0 fL   MCH 29.7 26.0 - 34.0 pg   MCHC 31.6 30.0 - 36.0 g/dL   RDW 16.1 (H) 11.5 - 15.5 %   Platelets 148 (L) 150 - 400 K/uL   nRBC 1.9 (H) 0.0 - 0.2 %  Glucose, capillary  Status: Abnormal   Collection Time: 02/21/20 12:11 AM  Result Value Ref Range   Glucose-Capillary 142 (H) 70 - 99 mg/dL  Basic metabolic panel     Status: Abnormal   Collection Time: 02/21/20  2:05 AM  Result Value Ref Range   Sodium 139 135 - 145 mmol/L   Potassium 3.4 (L) 3.5 - 5.1 mmol/L   Chloride 107 98 - 111 mmol/L   CO2 23 22 - 32 mmol/L   Glucose, Bld 117 (H) 70 - 99 mg/dL   BUN 55 (H) 8 - 23 mg/dL   Creatinine, Ser 1.61 (H) 0.44 - 1.00 mg/dL   Calcium 8.0 (L) 8.9 - 10.3 mg/dL   GFR, Estimated 32 (L) >60 mL/min   Anion gap 9 5 - 15  Magnesium     Status: None   Collection Time: 02/21/20  2:05 AM  Result Value Ref Range   Magnesium 2.4 1.7 - 2.4 mg/dL  Phosphorus     Status: Abnormal   Collection Time: 02/21/20  2:05 AM  Result Value Ref Range   Phosphorus 2.4 (L) 2.5 - 4.6 mg/dL  Glucose, capillary     Status: None   Collection Time: 02/21/20  4:14 AM  Result Value Ref Range   Glucose-Capillary 92 70 - 99 mg/dL  CBC     Status: Abnormal   Collection Time: 02/21/20  7:42 AM  Result Value Ref Range   WBC 13.2 (H) 4.0 - 10.5 K/uL   RBC 2.68 (L) 3.87 - 5.11 MIL/uL   Hemoglobin 8.0 (L) 12.0 - 15.0 g/dL   HCT 25.4 (L) 36 - 46 %   MCV 94.8 80.0 - 100.0 fL   MCH 29.9 26.0 - 34.0 pg   MCHC 31.5 30.0 - 36.0 g/dL   RDW 15.9 (H) 11.5 - 15.5 %   Platelets 154 150 - 400 K/uL   nRBC 0.9 (H) 0.0 - 0.2 %    Glucose, capillary     Status: Abnormal   Collection Time: 02/21/20  9:24 AM  Result Value Ref Range   Glucose-Capillary 108 (H) 70 - 99 mg/dL  Glucose, capillary     Status: Abnormal   Collection Time: 02/21/20 11:43 AM  Result Value Ref Range   Glucose-Capillary 112 (H) 70 - 99 mg/dL   Comment 1 Notify RN    Comment 2 Document in Chart     Recent Results (from the past 240 hour(s))  Culture, Urine     Status: None   Collection Time: 02/13/20  1:00 AM   Specimen: Urine, Random  Result Value Ref Range Status   Specimen Description URINE, RANDOM  Final   Special Requests NONE  Final   Culture   Final    NO GROWTH Performed at [facility], [address], [place], [place] [address]    Report Status 02/14/2020 FINAL  Final  Urine Culture     Status: Abnormal   Collection Time: 02/17/20  4:27 PM   Specimen: Urine, Random  Result Value Ref Range Status   Specimen Description URINE, RANDOM  Final   Special Requests NONE  Final   Culture (A)  Final    <10,000 COLONIES/mL INSIGNIFICANT GROWTH Performed at [facility], [place] [address] [place], [place] [address]    Report Status 02/19/2020 FINAL  Final     Radiology Studies: No results found. DG CHEST PORT 1 VIEW  Final Result    CT ABDOMEN PELVIS WO CONTRAST  Final Result  [place] EKG SITE RITE  Final Result    DG Chest Port 1 View  Final Result      Scheduled Meds:  aspirin EC  81 mg Oral Daily   Cerefolin NAC  1 tablet Oral q AM   Chlorhexidine Gluconate Cloth  6 each Topical Daily   exemestane  25 mg Oral QPC breakfast   febuxostat  40 mg Oral Daily   feeding supplement (GLUCERNA SHAKE)  237 mL Oral TID WC   Gerhardt's butt cream  1 application Topical TID   insulin aspart  0-9 Units Subcutaneous TID AC & HS   insulin detemir  8 Units Subcutaneous BID   linagliptin  5 mg Oral Daily   mirtazapine  15 mg Oral QHS   multivitamin with minerals  1 tablet Oral Daily   pantoprazole  (PROTONIX) IV  40 mg Intravenous Q12H   rosuvastatin  20 mg Oral Daily   senna-docusate  2 tablet Oral BID   sodium chloride flush  10-40 mL Intracatheter Q12H   PRN Meds: acetaminophen, alum & mag hydroxide-simeth, bisacodyl, guaiFENesin-dextromethorphan, hydrocortisone, lidocaine, menthol-cetylpyridinium **OR** phenol, methocarbamol, nitroGLYCERIN, ondansetron (ZOFRAN) IV, polyethylene glycol, prochlorperazine **OR** [DISCONTINUED] prochlorperazine **OR** [DISCONTINUED] prochlorperazine, sodium chloride flush, sodium phosphate, traZODone, witch hazel-glycerin Continuous Infusions:  sodium chloride 10 mL/hr at 02/18/20 1233   lactated ringers 75 mL/hr at 02/20/20 1800     LOS: 4 days  Time spent: Greater than 50% of the 35 minute visit was spent in counseling/coordination of care for the patient as laid out in the A&P.   Dwyane Dee, MD [facility] 02/21/2020, 1:01 PM

## 2020-02-21 NOTE — Assessment & Plan Note (Addendum)
-   foley in place; creatinine has improved significantly -Trial of void on 02/22/2020.  Remove Foley.  She continues to void well -On 02/23/2020, she complained of ongoing pain with urination.  Will check a urinalysis at this point; positive for lrg LE, few bacteria; given ongoing symptoms will start Rocephin and complete 3-5 day course; follow up UCx if reflexes

## 2020-02-21 NOTE — Assessment & Plan Note (Addendum)
Subacute CVA/ small SDH on previous admit, some question regarding whether this was actually a stroke -Non-focal -restarted ASA 11/25; recurrent GB on 11/27. Follow up EGD (asa again on hold)

## 2020-02-22 LAB — BASIC METABOLIC PANEL
Anion gap: 11 (ref 5–15)
BUN: 35 mg/dL — ABNORMAL HIGH (ref 8–23)
CO2: 24 mmol/L (ref 22–32)
Calcium: 8.2 mg/dL — ABNORMAL LOW (ref 8.9–10.3)
Chloride: 106 mmol/L (ref 98–111)
Creatinine, Ser: 1.42 mg/dL — ABNORMAL HIGH (ref 0.44–1.00)
GFR, Estimated: 37 mL/min — ABNORMAL LOW (ref >60)
Glucose, Bld: 115 mg/dL — ABNORMAL HIGH (ref 70–99)
Potassium: 3.6 mmol/L (ref 3.5–5.1)
Sodium: 141 mmol/L (ref 135–145)

## 2020-02-22 LAB — TYPE AND SCREEN
ABO/RH(D): A POS
Antibody Screen: NEGATIVE
Donor AG Type: NEGATIVE
Donor AG Type: NEGATIVE
Donor AG Type: NEGATIVE
Donor AG Type: NEGATIVE
Donor AG Type: NEGATIVE
Donor AG Type: NEGATIVE
Unit division: 0
Unit division: 0
Unit division: 0
Unit division: 0
Unit division: 0
Unit division: 0

## 2020-02-22 LAB — BPAM RBC
Blood Product Expiration Date: 202112232359
Blood Product Expiration Date: 202112252359
Blood Product Expiration Date: 202112252359
Blood Product Expiration Date: 202112282359
Blood Product Expiration Date: 202112282359
Blood Product Expiration Date: 202112292359
ISSUE DATE / TIME: 202111222101
ISSUE DATE / TIME: 202111222101
ISSUE DATE / TIME: 202111230629
ISSUE DATE / TIME: 202111240005
Unit Type and Rh: 6200
Unit Type and Rh: 6200
Unit Type and Rh: 6200
Unit Type and Rh: 6200
Unit Type and Rh: 6200
Unit Type and Rh: 6200

## 2020-02-22 LAB — GLUCOSE, CAPILLARY
Glucose-Capillary: 114 mg/dL — ABNORMAL HIGH (ref 70–99)
Glucose-Capillary: 156 mg/dL — ABNORMAL HIGH (ref 70–99)
Glucose-Capillary: 114 mg/dL — ABNORMAL HIGH (ref 70–99)
Glucose-Capillary: 159 mg/dL — ABNORMAL HIGH (ref 70–99)
Glucose-Capillary: 161 mg/dL — ABNORMAL HIGH (ref 70–99)

## 2020-02-22 LAB — CBC
HCT: 19.8 % — ABNORMAL LOW (ref 36.0–46.0)
HCT: 25.9 % — ABNORMAL LOW (ref 36.0–46.0)
Hemoglobin: 6.1 g/dL — CL (ref 12.0–15.0)
Hemoglobin: 8 g/dL — ABNORMAL LOW (ref 12.0–15.0)
MCH: 29.2 pg (ref 26.0–34.0)
MCH: 29.2 pg (ref 26.0–34.0)
MCHC: 30.8 g/dL (ref 30.0–36.0)
MCHC: 30.9 g/dL (ref 30.0–36.0)
MCV: 94.5 fL (ref 80.0–100.0)
MCV: 94.7 fL (ref 80.0–100.0)
Platelets: 184 10*3/uL (ref 150–400)
Platelets: 223 10*3/uL (ref 150–400)
RBC: 2.09 MIL/uL — ABNORMAL LOW (ref 3.87–5.11)
RBC: 2.74 MIL/uL — ABNORMAL LOW (ref 3.87–5.11)
RDW: 15.3 % (ref 11.5–15.5)
RDW: 15.7 % — ABNORMAL HIGH (ref 11.5–15.5)
WBC: 14.3 10*3/uL — ABNORMAL HIGH (ref 4.0–10.5)
WBC: 15.8 10*3/uL — ABNORMAL HIGH (ref 4.0–10.5)
nRBC: 0.1 % (ref 0.0–0.2)
nRBC: 0.4 % — ABNORMAL HIGH (ref 0.0–0.2)

## 2020-02-22 LAB — PHOSPHORUS: Phosphorus: 2.4 mg/dL — ABNORMAL LOW (ref 2.5–4.6)

## 2020-02-22 LAB — MAGNESIUM: Magnesium: 2.5 mg/dL — ABNORMAL HIGH (ref 1.7–2.4)

## 2020-02-22 LAB — PREPARE RBC (CROSSMATCH)

## 2020-02-22 MED ORDER — K PHOS MONO-SOD PHOS DI & MONO 155-852-130 MG PO TABS
500.0000 mg | ORAL_TABLET | Freq: Once | ORAL | Status: AC
  Administered 2020-02-22: 500 mg via ORAL
  Filled 2020-02-22: qty 2

## 2020-02-22 MED ORDER — SODIUM CHLORIDE 0.9% IV SOLUTION: Freq: Once | INTRAVENOUS | Status: AC

## 2020-02-22 NOTE — Progress Notes (Addendum)
11am - Foley catherter discontinued.purewick inplaced pt. Voiding yellow urine.. Pt had large BM, black tarry stools. 1730 -HGB 6.1, pt complaining of painful urination. MD aware.

## 2020-02-22 NOTE — Care Management Important Message (Signed)
Important Message  Patient Details  Name: [name] MRN: [id number] Date of Birth: 07/25/1940   Medicare Important Message Given:  Yes     Shelda Altes 02/22/2020, 8:10 AM

## 2020-02-22 NOTE — Progress Notes (Signed)
   02/22/20 2121  Assess: MEWS Score  Temp 98.7 F (37.1 C)  BP (!) 99/51  Pulse Rate (!) 114  ECG Heart Rate (!) 114  Resp (!) 21  Level of Consciousness Alert  SpO2 97 %  O2 Device Nasal Cannula  Patient Activity (if Appropriate) In bed  O2 Flow Rate (L/min) 1 L/min  Assess: MEWS Score  MEWS Temp 0  MEWS Systolic 1  MEWS Pulse 2  MEWS RR 1  MEWS LOC 0  MEWS Score 4  MEWS Score Color Red  Assess: if the MEWS score is Yellow or Red  Were vital signs taken at a resting state? Yes  Focused Assessment No change from prior assessment  Early Detection of Sepsis Score *See Row Information* Low  MEWS guidelines implemented *See Row Information* Yes  Treat  MEWS Interventions Escalated (See documentation below);Administered scheduled meds/treatments  Pain Scale 0-10  Pain Score 0  Take Vital Signs  Increase Vital Sign Frequency  Red: Q 1hr X 4 then Q 4hr X 4, if remains red, continue Q 4hrs  Escalate  MEWS: Escalate Red: discuss with charge nurse/RN and provider, consider discussing with RRT  Notify: Charge Nurse/RN  Name of Charge Nurse/RN Notified [employer]  Date Charge Nurse/RN Notified 02/22/20  Time Charge Nurse/RN Notified 2234

## 2020-02-22 NOTE — Progress Notes (Signed)
PROGRESS NOTE    [name]   KYH:[id number]  DOB: July 24, 1940  DOA: 02/17/2020     5  PCP: [name], DO  CC: GIB  Hospital Course: [name] is an 79 year old female notable PMH for CAD s/p stent, CKD stage IV, DMT2, HFpEF, and breast cancer.   Previously admitted 11/12 to 11/19 for left hip fracture s/p hemiarthroplasty 11/14 and incidentally found to have subacute CVA and small SDH.  MRI brain done and revealed thin acute right frontal lobe SDH with moderate stable small vessel disease and suspected 5 mm incidental meningioma. 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  No role for surgical intervention per Neurosurgery.  She was managed on ASA/ plavix for stroke prevention.   Hospital course complicated by ABLA, hypoxia due to volume overload, and urinary retention.  Discharged to CIR on 11/19, however developed nausea, abdominal/ epigastric pain with hypotension with Hgb drop from 8.2-> 5.4.  Vomited x 1 with questionable aspiration and re-admitted to hospital on 11/21.   On 11/20, she complained of epigastric and abdominal discomfort without SOB with ongoing constipation.  KUB showed non-obstructive gas pattered with stool present in rectum.  UA negative.  Has been afebrile.  Overnight, she developed hypotension with ongoing abdominal and epigastric pain, nausea, SOB, and with one episode of reported emesis, unclear if bloody.  Post emesis, staff report she had a wet cough with audible rales, requiring 3L [place].  Hgb 5.4, down from 8.2 on 11/20.  She was accepted by [facility] and on arrival, with ongoing SBP in the 70's.  2 units PRBC, CXR, and CT abd/ pelvis ordered.   PCCM consulted for ICU transfer given hemodynamic instability.   Her hemoglobin stabilized as well as vitals.  She was able to be  weaned off of oxygen to room air.  She was transferred out of the ICU on 02/20/2020.   Interval History:  Patient transferred out of the ICU on 02/20/2020.   No events overnight.  Overall feeling well and is amenable for removing the Foley today and doing a trial of void.  Old records reviewed in assessment of this patient  ROS: Constitutional: positive for fatigue and malaise, Respiratory: negative, Cardiovascular: negative for chest pain and Gastrointestinal: negative for abdominal pain  Assessment & Plan: * Hemorrhagic shock (HCC)-resolved as of 02/21/2020 -Suspected due to upper GI bleed from esophagitis (Grade D on EGD 11/23) contributing - s/p PRBC; Hgb now stable ~8 -Continue trending -Now placed back on aspirin on 02/21/2020.  Plavix has been discontinued  S/p left hip fracture - s/p repair on 11/14 - continue PT/OT  Acute respiratory failure with hypoxia (HCC)-resolved as of 02/21/2020 -Considered due to atelectasis -Working well with spirometry -Oxygen has been weaned off on 11/25  History of CVA (cerebrovascular accident) Subacute CVA/ small SDH on previous admit, some question regarding whether this was actually a stroke -Non-focal -start ASA 11/25, if recurrent GIB would just stop  Acute renal failure superimposed on stage 4 chronic kidney disease ([place]) - renal function slowly improving - daily BMP  Pulmonary nodule - LLL 74mm, consider f/u scan in 12 mo  Urinary retention - foley in place; creatinine has improved significantly -Trial of void on 02/22/2020.  Remove Foley -? 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  Objective: Blood pressure (!) 149/65, pulse (!) 103, temperature 98.8 F (37.1 C), temperature source Oral, resp. rate 18, height 5\' 8"  (1.727 m), weight 97.1 kg, SpO2 97 %.  Examination: General appearance: alert, cooperative and no distress Head: Normocephalic, without obvious abnormality, atraumatic Eyes: EOMI Lungs: clear to auscultation bilaterally Heart: regular rate and rhythm and S1, S2 normal Abdomen: normal findings: bowel sounds normal and soft, non-tender Extremities: 3+ bilateral lower extremity edema, chronic Skin: normal Neurologic: Grossly normal.  Global weakness but no focal deficits  Consultants:     Procedures:     Data Reviewed: I have personally reviewed following labs and imaging studies Results for orders placed or performed during the hospital encounter of 02/17/20 (from the past 24 hour(s))  CBC     Status: Abnormal   Collection Time: 02/21/20  3:42 PM  Result Value Ref Range   WBC 14.0 (H) 4.0 - 10.5 K/uL   RBC 2.66 (L) 3.87 - 5.11 MIL/uL   Hemoglobin 7.8 (L) 12.0 - 15.0 g/dL   HCT 25.3 (L) 36 - 46 %   MCV 95.1 80.0 - 100.0 fL   MCH 29.3 26.0 - 34.0 pg   MCHC 30.8 30.0 - 36.0 g/dL   RDW 15.7 (H) 11.5 - 15.5 %   Platelets 171 150 - 400 K/uL   nRBC 0.8 (H) 0.0 - 0.2 %  Glucose, capillary     Status: Abnormal   Collection Time: 02/21/20  4:14 PM  Result Value Ref Range   Glucose-Capillary 165 (H) 70 - 99 mg/dL  Glucose, capillary     Status: Abnormal   Collection Time: 02/21/20  9:21 PM  Result Value Ref Range   Glucose-Capillary 133 (H) 70 - 99 mg/dL  CBC     Status: Abnormal   Collection Time: 02/21/20 11:42 PM  Result Value Ref Range   WBC 14.3  (H) 4.0 - 10.5 K/uL   RBC 2.74 (L) 3.87 - 5.11 MIL/uL   Hemoglobin 8.0 (L) 12.0 - 15.0 g/dL   HCT 25.9 (L) 36 - 46 %   MCV 94.5 80.0 - 100.0 fL   MCH 29.2 26.0 - 34.0 pg   MCHC 30.9 30.0 - 36.0 g/dL   RDW 15.7 (H) 11.5 - 15.5 %   Platelets 184 150 - 400 K/uL   nRBC 0.4 (H) 0.0 - 0.2 %  Glucose, capillary     Status: None   Collection Time: 02/21/20 11:50 PM  Result Value Ref Range   Glucose-Capillary 93 70 - 99 mg/dL  Glucose, capillary     Status: Abnormal   Collection Time: 02/22/20  5:13 AM  Result Value Ref Range   Glucose-Capillary 114 (H) 70 - 99 mg/dL  Basic metabolic panel  Status: Abnormal   Collection Time: 02/22/20  5:23 AM  Result Value Ref Range   Sodium 141 135 - 145 mmol/L   Potassium 3.6 3.5 - 5.1 mmol/L   Chloride 106 98 - 111 mmol/L   CO2 24 22 - 32 mmol/L   Glucose, Bld 115 (H) 70 - 99 mg/dL   BUN 35 (H) 8 - 23 mg/dL   Creatinine, Ser 1.42 (H) 0.44 - 1.00 mg/dL   Calcium 8.2 (L) 8.9 - 10.3 mg/dL   GFR, Estimated 37 (L) >60 mL/min   Anion gap 11 5 - 15  Magnesium     Status: Abnormal   Collection Time: 02/22/20  5:23 AM  Result Value Ref Range   Magnesium 2.5 (H) 1.7 - 2.4 mg/dL  Phosphorus     Status: Abnormal   Collection Time: 02/22/20  5:23 AM  Result Value Ref Range   Phosphorus 2.4 (L) 2.5 - 4.6 mg/dL  Glucose, capillary     Status: Abnormal   Collection Time: 02/22/20  9:04 AM  Result Value Ref Range   Glucose-Capillary 156 (H) 70 - 99 mg/dL  Glucose, capillary     Status: Abnormal   Collection Time: 02/22/20 11:31 AM  Result Value Ref Range   Glucose-Capillary 114 (H) 70 - 99 mg/dL    Recent Results (from the past 240 hour(s))  Culture, Urine     Status: None   Collection Time: 02/13/20  1:00 AM   Specimen: Urine, Random  Result Value Ref Range Status   Specimen Description URINE, RANDOM  Final   Special Requests NONE  Final   Culture   Final    NO GROWTH Performed at [facility], [address], [place], [place] [address]     Report Status 02/14/2020 FINAL  Final  Urine Culture     Status: Abnormal   Collection Time: 02/17/20  4:27 PM   Specimen: Urine, Random  Result Value Ref Range Status   Specimen Description URINE, RANDOM  Final   Special Requests NONE  Final   Culture (A)  Final    <10,000 COLONIES/mL INSIGNIFICANT GROWTH Performed at [facility], [place] [address] [place], [place] [address]    Report Status 02/19/2020 FINAL  Final     Radiology Studies: No results found. DG CHEST PORT 1 VIEW  Final Result    CT ABDOMEN PELVIS WO CONTRAST  Final Result    [place] EKG SITE RITE  Final Result    DG Chest Port 1 View  Final Result      Scheduled Meds: . aspirin EC  81 mg Oral Daily  . Cerefolin NAC  1 tablet Oral q AM  . Chlorhexidine Gluconate Cloth  6 each Topical Daily  . exemestane  25 mg Oral QPC breakfast  . febuxostat  40 mg Oral Daily  . feeding supplement (GLUCERNA SHAKE)  237 mL Oral TID WC  . Gerhardt's butt cream  1 application Topical TID  . insulin aspart  0-9 Units Subcutaneous TID AC & HS  . insulin detemir  8 Units Subcutaneous BID  . linagliptin  5 mg Oral Daily  . mirtazapine  15 mg Oral QHS  . multivitamin with minerals  1 tablet Oral Daily  . pantoprazole (PROTONIX) IV  40 mg Intravenous Q12H  . rosuvastatin  20 mg Oral Daily  . senna-docusate  2 tablet Oral BID  . sodium chloride flush  10-40 mL Intracatheter Q12H   PRN Meds: acetaminophen, alum &  mag hydroxide-simeth, bisacodyl, guaiFENesin-dextromethorphan, hydrocortisone, lidocaine, menthol-cetylpyridinium **OR** phenol, methocarbamol, nitroGLYCERIN, ondansetron (ZOFRAN) IV, polyethylene glycol, prochlorperazine **OR** [DISCONTINUED] prochlorperazine **OR** [DISCONTINUED] prochlorperazine, sodium chloride flush, sodium phosphate, traZODone, witch hazel-glycerin Continuous Infusions: . sodium chloride 10 mL/hr at 02/18/20 1233  . lactated ringers 75 mL/hr at 02/22/20 0903     LOS: 5 days  Time spent:  Greater than 50% of the 35 minute visit was spent in counseling/coordination of care for the patient as laid out in the A&P.   Dwyane Dee, MD [facility] 02/22/2020, 12:46 PM

## 2020-02-23 ENCOUNTER — Encounter (HOSPITAL_COMMUNITY): Payer: Self-pay | Admitting: Internal Medicine

## 2020-02-23 ENCOUNTER — Inpatient Hospital Stay (HOSPITAL_COMMUNITY): Payer: Medicare Other | Admitting: Certified Registered Nurse Anesthetist

## 2020-02-23 ENCOUNTER — Encounter (HOSPITAL_COMMUNITY)
Admission: AD | Disposition: A | Discharge status: Skilled Nursing Facility | Payer: Self-pay | Source: Intra-hospital | Attending: Internal Medicine

## 2020-02-23 DIAGNOSIS — K922 Gastrointestinal hemorrhage, unspecified: Secondary | ICD-10-CM

## 2020-02-23 HISTORY — PX: ESOPHAGOGASTRODUODENOSCOPY (EGD) WITH PROPOFOL: SHX5813

## 2020-02-23 LAB — CBC
HCT: 18.8 % — ABNORMAL LOW (ref 36.0–46.0)
HCT: 23.7 % — ABNORMAL LOW (ref 36.0–46.0)
HCT: 21.2 % — ABNORMAL LOW (ref 36.0–46.0)
Hemoglobin: 5.9 g/dL — CL (ref 12.0–15.0)
Hemoglobin: 7.7 g/dL — ABNORMAL LOW (ref 12.0–15.0)
Hemoglobin: 6.8 g/dL — CL (ref 12.0–15.0)
MCH: 29.1 pg (ref 26.0–34.0)
MCH: 28.9 pg (ref 26.0–34.0)
MCH: 28.9 pg (ref 26.0–34.0)
MCHC: 31.4 g/dL (ref 30.0–36.0)
MCHC: 32.5 g/dL (ref 30.0–36.0)
MCHC: 32.1 g/dL (ref 30.0–36.0)
MCV: 92.6 fL (ref 80.0–100.0)
MCV: 89.1 fL (ref 80.0–100.0)
MCV: 90.2 fL (ref 80.0–100.0)
Platelets: 239 10*3/uL (ref 150–400)
Platelets: 244 10*3/uL (ref 150–400)
Platelets: 235 10*3/uL (ref 150–400)
RBC: 2.03 MIL/uL — ABNORMAL LOW (ref 3.87–5.11)
RBC: 2.66 MIL/uL — ABNORMAL LOW (ref 3.87–5.11)
RBC: 2.35 MIL/uL — ABNORMAL LOW (ref 3.87–5.11)
RDW: 15.5 % (ref 11.5–15.5)
RDW: 15.9 % — ABNORMAL HIGH (ref 11.5–15.5)
RDW: 16.1 % — ABNORMAL HIGH (ref 11.5–15.5)
WBC: 23.3 10*3/uL — ABNORMAL HIGH (ref 4.0–10.5)
WBC: 21.3 10*3/uL — ABNORMAL HIGH (ref 4.0–10.5)
WBC: 18.2 10*3/uL — ABNORMAL HIGH (ref 4.0–10.5)
nRBC: 0.1 % (ref 0.0–0.2)
nRBC: 0.1 % (ref 0.0–0.2)
nRBC: 0.1 % (ref 0.0–0.2)

## 2020-02-23 LAB — MAGNESIUM: Magnesium: 2 mg/dL (ref 1.7–2.4)

## 2020-02-23 LAB — BASIC METABOLIC PANEL
Anion gap: 8 (ref 5–15)
BUN: 33 mg/dL — ABNORMAL HIGH (ref 8–23)
CO2: 25 mmol/L (ref 22–32)
Calcium: 7.3 mg/dL — ABNORMAL LOW (ref 8.9–10.3)
Chloride: 106 mmol/L (ref 98–111)
Creatinine, Ser: 1.36 mg/dL — ABNORMAL HIGH (ref 0.44–1.00)
GFR, Estimated: 39 mL/min — ABNORMAL LOW (ref >60)
Glucose, Bld: 131 mg/dL — ABNORMAL HIGH (ref 70–99)
Potassium: 4 mmol/L (ref 3.5–5.1)
Sodium: 139 mmol/L (ref 135–145)

## 2020-02-23 LAB — URINALYSIS, ROUTINE W REFLEX MICROSCOPIC
Bilirubin Urine: NEGATIVE
Glucose, UA: NEGATIVE mg/dL
Ketones, ur: NEGATIVE mg/dL
Nitrite: NEGATIVE
Protein, ur: 30 mg/dL — AB
Specific Gravity, Urine: 1.008 (ref 1.005–1.030)
WBC, UA: >50 WBC/hpf — ABNORMAL HIGH (ref 0–5)
pH: 6 (ref 5.0–8.0)

## 2020-02-23 LAB — GLUCOSE, CAPILLARY
Glucose-Capillary: 106 mg/dL — ABNORMAL HIGH (ref 70–99)
Glucose-Capillary: 130 mg/dL — ABNORMAL HIGH (ref 70–99)
Glucose-Capillary: 113 mg/dL — ABNORMAL HIGH (ref 70–99)
Glucose-Capillary: 112 mg/dL — ABNORMAL HIGH (ref 70–99)
Glucose-Capillary: 207 mg/dL — ABNORMAL HIGH (ref 70–99)

## 2020-02-23 LAB — PREPARE RBC (CROSSMATCH)

## 2020-02-23 LAB — PHOSPHORUS: Phosphorus: 2.9 mg/dL (ref 2.5–4.6)

## 2020-02-23 SURGERY — ESOPHAGOGASTRODUODENOSCOPY (EGD) WITH PROPOFOL
Anesthesia: Monitor Anesthesia Care | Laterality: Left

## 2020-02-23 MED ORDER — PROPOFOL 10 MG/ML IV BOLUS
INTRAVENOUS | Status: DC | PRN
  Administered 2020-02-23 (×2): 10 mg via INTRAVENOUS
  Administered 2020-02-23: 20 mg via INTRAVENOUS

## 2020-02-23 MED ORDER — FUROSEMIDE 10 MG/ML IJ SOLN
40.0000 mg | Freq: Once | INTRAMUSCULAR | Status: AC
  Administered 2020-02-23: 40 mg via INTRAVENOUS
  Filled 2020-02-23: qty 4

## 2020-02-23 MED ORDER — FUROSEMIDE 10 MG/ML IJ SOLN
20.0000 mg | Freq: Once | INTRAMUSCULAR | Status: DC
  Filled 2020-02-23: qty 2

## 2020-02-23 MED ORDER — SODIUM CHLORIDE 0.9% IV SOLUTION: Freq: Once | INTRAVENOUS | Status: AC

## 2020-02-23 MED ORDER — DIPHENHYDRAMINE HCL 25 MG PO CAPS
25.0000 mg | ORAL_CAPSULE | Freq: Once | ORAL | Status: AC
  Administered 2020-02-23: 25 mg via ORAL
  Filled 2020-02-23: qty 1

## 2020-02-23 MED ORDER — PROPOFOL 500 MG/50ML IV EMUL
INTRAVENOUS | Status: DC | PRN
  Administered 2020-02-23: 100 ug/kg/min via INTRAVENOUS

## 2020-02-23 MED ORDER — PHENYLEPHRINE 40 MCG/ML (10ML) SYRINGE FOR IV PUSH (FOR BLOOD PRESSURE SUPPORT)
PREFILLED_SYRINGE | INTRAVENOUS | Status: DC | PRN
  Administered 2020-02-23 (×2): 40 ug via INTRAVENOUS

## 2020-02-23 MED ORDER — ACETAMINOPHEN 325 MG PO TABS
650.0000 mg | ORAL_TABLET | Freq: Once | ORAL | Status: AC
  Administered 2020-02-23: 650 mg via ORAL
  Filled 2020-02-23: qty 2

## 2020-02-23 MED ORDER — SODIUM CHLORIDE 0.9 % IV SOLN: INTRAVENOUS | Status: DC

## 2020-02-23 SURGICAL SUPPLY — 15 items

## 2020-02-23 NOTE — Progress Notes (Signed)
   02/23/20 0800  Assess: MEWS Score  BP 136/67  Pulse Rate (!) 102  ECG Heart Rate (!) 103  Level of Consciousness Alert  SpO2 95 %  Assess: MEWS Score  MEWS Temp 0  MEWS Systolic 0  MEWS Pulse 1  MEWS RR 2  MEWS LOC 0  MEWS Score 3  MEWS Score Color Yellow  Assess: if the MEWS score is Yellow or Red  Were vital signs taken at a resting state? Yes  Focused Assessment No change from prior assessment  Early Detection of Sepsis Score *See Row Information* Low  MEWS guidelines implemented *See Row Information* No, previously yellow, continue vital signs every 4 hours  Treat  Nausea relieved by Antiemetic  Document  Progress note created (see row info) Yes   Patient previously yellow mews due to HR and BP. Patient getting blood transfusion and MD aware. Will continue q4 hr vitals

## 2020-02-23 NOTE — Interval H&P Note (Signed)
History and Physical Interval Note:  02/23/2020 2:02 PM  [name]  has presented today for surgery, with the diagnosis of melena, anemia.  The various methods of treatment have been discussed with the patient and family. After consideration of risks, benefits and other options for treatment, the patient has consented to  Procedure(s): ESOPHAGOGASTRODUODENOSCOPY (EGD) WITH PROPOFOL (Left) as a surgical intervention.  The patient's history has been reviewed, patient examined, no change in status, stable for surgery.  I have reviewed the patient's chart and labs.  Questions were answered to the patient's satisfaction.     Bladen Umar M   Recurrent melena, anemia, hypotension.  Recent endoscopy gastric and duodenal ulcers.  Needs repeat endoscopy today.  Risks (bleeding, infection, bowel perforation that could require surgery, sedation-related changes in cardiopulmonary systems), benefits (identification and possible treatment of source of symptoms, exclusion of certain causes of symptoms), and alternatives (watchful waiting, radiographic imaging studies, empiric medical treatment) of upper endoscopy (EGD) were explained to patient/family in detail and patient wishes to proceed.

## 2020-02-23 NOTE — Progress Notes (Signed)
PROGRESS NOTE    [name]   SKA:[id number]  DOB: 01-Sep-1940  DOA: 02/17/2020     6  PCP: [name], DO  CC: GIB  Hospital Course: [name] is an 79 year old female notable PMH for CAD s/p stent, CKD stage IV, DMT2, HFpEF, and breast cancer.   Previously admitted 11/12 to 11/19 for left hip fracture s/p hemiarthroplasty 11/14 and incidentally found to have subacute CVA and small SDH.  MRI brain done and revealed thin acute right frontal lobe SDH with moderate stable small vessel disease and suspected 5 mm incidental meningioma. 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  No role for surgical intervention per Neurosurgery.  She was managed on ASA/ plavix for stroke prevention.   Hospital course complicated by ABLA, hypoxia due to volume overload, and urinary retention.  Discharged to CIR on 11/19, however developed nausea, abdominal/ epigastric pain with hypotension with Hgb drop from 8.2-> 5.4.  Vomited x 1 with questionable aspiration and re-admitted to hospital on 11/21.   On 11/20, she complained of epigastric and abdominal discomfort without SOB with ongoing constipation.  KUB showed non-obstructive gas pattered with stool present in rectum.  UA negative.  Has been afebrile.  Overnight, she developed hypotension with ongoing abdominal and epigastric pain, nausea, SOB, and with one episode of reported emesis, unclear if bloody.  Post emesis, staff report she had a wet cough with audible rales, requiring 3L [place].  Hgb 5.4, down from 8.2 on 11/20.  She was accepted by [facility] and on arrival, with ongoing SBP in the 70's.  2 units PRBC, CXR, and CT abd/ pelvis ordered.   PCCM consulted for ICU transfer given hemodynamic instability.   Her hemoglobin stabilized as well as vitals.  She was able to be  weaned off of oxygen to room air.  She was transferred out of the ICU on 02/20/2020.  She had been recovering fairly well until the evening of 02/22/2020 when she developed melanotic stools and a significant drop in her hemoglobin from 8 g/dL down to 6.1 g/dL.  She also became hypotensive and tachycardic.  She was started on IV fluids and transfused 3 units PRBC.  GI was reconsulted with plans for repeat EGD on 02/23/2020.   Interval History:  Patient transferred out of the ICU on 02/20/2020.   Multiple melanotic stools overnight with drop in hemoglobin from 8 g/dL down to 6.1 g/dL.  She has received a total of 3 units PRBC since yesterday evening. Planning to undergo repeat EGD this afternoon with GI to reevaluate underlying ulcers.  Old records reviewed in assessment of this patient  ROS: Constitutional: positive for fatigue and malaise, Respiratory: negative, Cardiovascular: negative for chest pain and Gastrointestinal: negative for abdominal pain  Assessment & Plan: * GIB (gastrointestinal bleeding) -Suspected due to upper GI bleed from esophagitis (Grade D on EGD 11/23) contributing - s/p PRBC; Hgb now stable ~8 -Continue trending -Now placed back on aspirin on 02/21/2020.  Plavix has been discontinued - asa back on hold on 11/27 after Hgb drop overnight and black tarry stools -GI reconsulted on 02/23/2020 for undergoing repeat EGD to reevaluate for hemoglobin drop -3 units PRBC transfused on 02/23/2020 -Continue trending hemoglobin  S/p left hip fracture - s/p repair on 11/14 - continue PT/OT  Acute respiratory failure with hypoxia (HCC)-resolved  as of 02/21/2020 -Considered due to atelectasis -Working well with spirometry -Oxygen has been weaned off on 11/25  History of CVA (cerebrovascular accident) Subacute CVA/ small SDH on previous admit, some question regarding whether this was actually a stroke -Non-focal -restarted ASA 11/25; recurrent GB on 11/27. Follow up EGD  (asa again on hold)  Acute renal failure superimposed on stage 4 chronic kidney disease ([place]) - renal function slowly improving - daily BMP  Pulmonary nodule - LLL 26mm, consider f/u scan in 12 mo  Urinary retention - foley in place; creatinine has improved significantly -Trial of void on 02/22/2020.  Remove Foley.  She continues to void well -On 02/23/2020, she complained of ongoing pain with urination.  Will check a urinalysis at this point  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 (HCC) - O1Y 8.8% -Continue SSI and CBG monitoring   Antimicrobials: None  DVT prophylaxis: SCD Code Status: Full Family Communication: None present Disposition Plan: Status is: Inpatient  Remains inpatient appropriate because:Unsafe d/c plan, IV treatments appropriate due to intensity of illness or inability to take PO and Inpatient level of care appropriate due to severity of illness   Dispo: The patient is from: CIR              Anticipated d/c is to: CIR if still a candidate              Anticipated d/c date is: > 3 days              Patient currently is not medically stable to d/c.  Objective: Blood pressure (!) 134/50, pulse (!) 101, temperature 98.7 F (37.1 C), temperature source Axillary, resp. rate (!) 32, height 5\' 8"  (1.727 m), weight 97.5 kg, SpO2 98 %.  Examination: General appearance: alert, cooperative and no distress Head: Normocephalic, without obvious abnormality, atraumatic Eyes: EOMI Lungs: Mild coarse breath sounds with crackles.  No wheezing appreciated Heart: regular rate and rhythm and S1, S2 normal Abdomen: normal findings: bowel sounds normal and soft, non-tender Extremities: 3+ bilateral lower extremity edema, chronic Skin: normal Neurologic: Grossly normal.  Global weakness but no focal deficits  Consultants:     Procedures:     Data Reviewed: I have personally reviewed following labs and imaging studies Results for orders placed or  performed during the hospital encounter of 02/17/20 (from the past 24 hour(s))  CBC     Status: Abnormal   Collection Time: 02/22/20  5:08 PM  Result Value Ref Range   WBC 15.8 (H) 4.0 - 10.5 K/uL   RBC 2.09 (L) 3.87 - 5.11 MIL/uL   Hemoglobin 6.1 (LL) 12.0 - 15.0 g/dL   HCT 19.8 (L) 36 - 46 %   MCV 94.7 80.0 - 100.0 fL   MCH 29.2 26.0 - 34.0 pg   MCHC 30.8 30.0 - 36.0 g/dL   RDW 15.3 11.5 - 15.5 %   Platelets 223 150 - 400 K/uL   nRBC 0.1 0.0 - 0.2 %  Glucose, capillary     Status: Abnormal   Collection Time: 02/22/20  5:25 PM  Result Value Ref Range   Glucose-Capillary 159 (H) 70 - 99 mg/dL  Prepare RBC (crossmatch)     Status: None   Collection Time: 02/22/20  7:30 PM  Result Value Ref Range   Order Confirmation      ORDER PROCESSED BY BLOOD BANK Performed at [facility], [address] [place], [place] [address]   Type and screen [facility]     Status: None (Preliminary result)   Collection Time: 02/22/20  7:41 PM  Result Value Ref Range   ABO/RH(D) A POS    Antibody Screen NEG    Sample Expiration 02/25/2020,2359    Unit Number [id number]    Blood Component Type RED CELLS,LR    Unit division 00    Status of Unit ISSUED    Transfusion Status OK TO TRANSFUSE    Crossmatch Result COMPATIBLE    Donor AG Type NEGATIVE FOR KIDD A ANTIGEN    Unit Number [id number]    Blood Component Type RED CELLS,LR    Unit division 00    Status of Unit ISSUED,FINAL    Transfusion Status OK TO TRANSFUSE    Crossmatch Result COMPATIBLE    Donor AG Type NEGATIVE FOR KIDD A ANTIGEN    Unit Number [id number]    Blood Component Type RED CELLS,LR    Unit division 00    Status of Unit ISSUED    Donor AG Type NEGATIVE FOR KIDD A ANTIGEN    Transfusion Status OK TO TRANSFUSE    Crossmatch Result COMPATIBLE   Glucose, capillary     Status: Abnormal   Collection Time: 02/22/20  7:50 PM  Result Value Ref Range   Glucose-Capillary 161 (H) 70 - 99 mg/dL    Glucose, capillary     Status: Abnormal   Collection Time: 02/23/20  1:23 AM  Result Value Ref Range   Glucose-Capillary 106 (H) 70 - 99 mg/dL  CBC     Status: Abnormal   Collection Time: 02/23/20  1:29 AM  Result Value Ref Range   WBC 23.3 (H) 4.0 - 10.5 K/uL   RBC 2.03 (L) 3.87 - 5.11 MIL/uL   Hemoglobin 5.9 (LL) 12.0 - 15.0 g/dL   HCT 18.8 (L) 36 - 46 %   MCV 92.6 80.0 - 100.0 fL   MCH 29.1 26.0 - 34.0 pg   MCHC 31.4 30.0 - 36.0 g/dL   RDW 15.5 11.5 - 15.5 %   Platelets 239 150 - 400 K/uL   nRBC 0.1 0.0 - 0.2 %  Prepare RBC (crossmatch)     Status: None   Collection Time: 02/23/20  2:10 AM  Result Value Ref Range   Order Confirmation      ORDER PROCESSED BY BLOOD BANK Performed at [facility], [place] [address], [place], [place] [address]   Basic metabolic panel     Status: Abnormal   Collection Time: 02/23/20  3:32 AM  Result Value Ref Range   Sodium 139 135 - 145 mmol/L   Potassium 4.0 3.5 - 5.1 mmol/L   Chloride 106 98 - 111 mmol/L   CO2 25 22 - 32 mmol/L   Glucose, Bld 131 (H) 70 - 99 mg/dL   BUN 33 (H) 8 - 23 mg/dL   Creatinine, Ser 1.36 (H) 0.44 - 1.00 mg/dL   Calcium 7.3 (L) 8.9 - 10.3 mg/dL   GFR, Estimated 39 (L) >60 mL/min   Anion gap 8 5 - 15  Magnesium     Status: None   Collection Time: 02/23/20  3:32 AM  Result Value Ref Range   Magnesium 2.0 1.7 - 2.4 mg/dL  Phosphorus     Status: None   Collection Time: 02/23/20  3:32 AM  Result Value Ref Range   Phosphorus 2.9 2.5 - 4.6 mg/dL  Glucose, capillary     Status: Abnormal   Collection Time: 02/23/20  5:53 AM  Result Value Ref Range   Glucose-Capillary 130 (H) 70 - 99 mg/dL  CBC     Status: Abnormal   Collection Time: 02/23/20  7:58 AM  Result Value Ref Range   WBC 22.1 (H) 4.0 - 10.5 K/uL   RBC 2.38 (L) 3.87 - 5.11 MIL/uL   Hemoglobin 6.8 (LL) 12.0 - 15.0 g/dL   HCT 21.6 (L) 36 - 46 %   MCV 90.8 80.0 - 100.0 fL   MCH 28.6 26.0 - 34.0 pg   MCHC 31.5 30.0 - 36.0 g/dL   RDW 16.2 (H) 11.5 -  15.5 %   Platelets 246 150 - 400 K/uL   nRBC 0.1 0.0 - 0.2 %  Glucose, capillary     Status: Abnormal   Collection Time: 02/23/20  1:08 PM  Result Value Ref Range   Glucose-Capillary 113 (H) 70 - 99 mg/dL  CBC     Status: Abnormal   Collection Time: 02/23/20  1:18 PM  Result Value Ref Range   WBC 21.3 (H) 4.0 - 10.5 K/uL   RBC 2.66 (L) 3.87 - 5.11 MIL/uL   Hemoglobin 7.7 (L) 12.0 - 15.0 g/dL   HCT 23.7 (L) 36 - 46 %   MCV 89.1 80.0 - 100.0 fL   MCH 28.9 26.0 - 34.0 pg   MCHC 32.5 30.0 - 36.0 g/dL   RDW 15.9 (H) 11.5 - 15.5 %   Platelets 244 150 - 400 K/uL   nRBC 0.1 0.0 - 0.2 %    Recent Results (from the past 240 hour(s))  Urine Culture     Status: Abnormal   Collection Time: 02/17/20  4:27 PM   Specimen: Urine, Random  Result Value Ref Range Status   Specimen Description URINE, RANDOM  Final   Special Requests NONE  Final   Culture (A)  Final    <10,000 COLONIES/mL INSIGNIFICANT GROWTH Performed at [facility], [address] [place], [place] [address]    Report Status 02/19/2020 FINAL  Final     Radiology Studies: No results found. DG CHEST PORT 1 VIEW  Final Result    CT ABDOMEN PELVIS WO CONTRAST  Final Result    [place] EKG SITE RITE  Final Result    DG Chest Port 1 View  Final Result      Scheduled Meds: . [MAR Hold] Cerefolin NAC  1 tablet Oral q AM  . [MAR Hold] Chlorhexidine Gluconate Cloth  6 each Topical Daily  . [MAR Hold] exemestane  25 mg Oral QPC breakfast  . [MAR Hold] febuxostat  40 mg Oral Daily  . [MAR Hold] feeding supplement (GLUCERNA SHAKE)  237 mL Oral TID WC  . [MAR Hold] furosemide  20 mg Intravenous Once  . [MAR Hold] furosemide  40 mg Intravenous Once  . [MAR Hold] Gerhardt's butt cream  1 application Topical TID  . [MAR Hold] insulin aspart  0-9 Units Subcutaneous TID AC & HS  . [MAR Hold] linagliptin  5 mg Oral Daily  . [MAR Hold] mirtazapine  15 mg Oral QHS  . [MAR Hold] multivitamin with minerals  1 tablet Oral Daily    . [MAR Hold] pantoprazole (PROTONIX) IV  40 mg Intravenous Q12H  . [MAR Hold] rosuvastatin  20 mg Oral Daily  . [MAR Hold] senna-docusate  2 tablet Oral BID  . [MAR Hold] sodium chloride flush  10-40 mL Intracatheter Q12H   PRN Meds: [MAR Hold] acetaminophen, [MAR Hold] alum & mag hydroxide-simeth, [MAR Hold] bisacodyl, [MAR  Hold] guaiFENesin-dextromethorphan, [MAR Hold] hydrocortisone, [MAR Hold] lidocaine, [MAR Hold] menthol-cetylpyridinium **OR** [MAR Hold] phenol, [MAR Hold] methocarbamol, [MAR Hold] nitroGLYCERIN, [MAR Hold] ondansetron (ZOFRAN) IV, [MAR Hold] polyethylene glycol, [MAR Hold] prochlorperazine **OR** [DISCONTINUED] prochlorperazine **OR** [DISCONTINUED] prochlorperazine, [MAR Hold] sodium chloride flush, [MAR Hold] sodium phosphate, [MAR Hold] traZODone, [MAR Hold] witch hazel-glycerin Continuous Infusions: . sodium chloride 10 mL/hr at 02/18/20 1233  . sodium chloride    . lactated ringers 20 mL/hr at 02/23/20 1338     LOS: 6 days  Time spent: Greater than 50% of the 35 minute visit was spent in counseling/coordination of care for the patient as laid out in the A&P.   Dwyane Dee, MD [facility] 02/23/2020, 1:39 PM

## 2020-02-23 NOTE — Transfer of Care (Signed)
Immediate Anesthesia Transfer of Care Note  Patient: [name]  Procedure(s) Performed: ESOPHAGOGASTRODUODENOSCOPY (EGD) WITH PROPOFOL (Left )  Patient Location: PACU  Anesthesia Type:MAC  Level of Consciousness: awake, alert  and oriented  Airway & Oxygen Therapy: Patient Spontanous Breathing and Patient connected to nasal cannula oxygen  Post-op Assessment: Report given to RN and Post -op Vital signs reviewed and stable  Post vital signs: Reviewed and stable  Last Vitals:  Vitals Value Taken Time  BP 100/52 02/23/20 1433  Temp 37.2 C 02/23/20 1430  Pulse 96 02/23/20 1436  Resp 24 02/23/20 1436  SpO2 96 % 02/23/20 1436  Vitals shown include unvalidated device data.  Last Pain:  Vitals:   02/23/20 1430  TempSrc:   PainSc: Asleep      Patients Stated Pain Goal: 0 (01/12/50 0258)  Complications: No complications documented.

## 2020-02-23 NOTE — Progress Notes (Signed)
Called by RN and informed that labs were drawn 1:30 unit of blood was transfused and hemoglobin is 5.9 on that lab.  Initial hemoglobin was 6.1.  CBC was drawn too early after the transfusion of packed red blood cells.  Patient continues to have multiple melanotic stools has suspected upper GI bleed. With patient continue to have melanotic stool and hemoglobin still low will transfuse 2 more units of packed red blood cells.  Hemoglobin level will be redrawn 2 hours after blood transfusions are completed

## 2020-02-23 NOTE — Progress Notes (Signed)
Called by RN. Hgb 6.8 tonight.  Transfuse one unit PRBC

## 2020-02-23 NOTE — Progress Notes (Signed)
Subjective: Multiple melenic stools over the past few days. Required multiple blood transfusions.  Objective: Vital signs in last 24 hours: Temp:  [97.6 F (36.4 C)-99.5 F (37.5 C)] 98.4 F (36.9 C) (11/27 0932) Pulse Rate:  [96-122] 96 (11/27 0932) Resp:  [18-35] 20 (11/27 0932) BP: (94-136)/(48-72) 111/53 (11/27 0932) SpO2:  [94 %-100 %] 97 % (11/27 0932) Weight:  [97.5 kg] 97.5 kg (11/27 0100) Weight change: 0.4 kg Last BM Date: 02/22/20  PE: GEN:  Weak, fatigued, chronically ill-appearing ABD:  Soft, non-tender  Lab Results: CBC    Component Value Date/Time   WBC 22.1 (H) 02/23/2020 0758   RBC 2.38 (L) 02/23/2020 0758   HGB 6.8 (LL) 02/23/2020 0758   HGB 11.1 (L) 12/05/2019 1003   HCT 21.6 (L) 02/23/2020 0758   PLT 246 02/23/2020 0758   PLT 294 12/05/2019 1003   MCV 90.8 02/23/2020 0758   MCV 87.0 07/06/2011 2111   MCH 28.6 02/23/2020 0758   MCHC 31.5 02/23/2020 0758   RDW 16.2 (H) 02/23/2020 0758   LYMPHSABS 1.7 02/17/2020 0804   MONOABS 1.2 (H) 02/17/2020 0804   EOSABS 0.1 02/17/2020 0804   BASOSABS 0.1 02/17/2020 0804   CMP     Component Value Date/Time   NA 139 02/23/2020 0332   NA 140 08/01/2017 1143   K 4.0 02/23/2020 0332   CL 106 02/23/2020 0332   CO2 25 02/23/2020 0332   GLUCOSE 131 (H) 02/23/2020 0332   BUN 33 (H) 02/23/2020 0332   BUN 25 08/01/2017 1143   CREATININE 1.36 (H) 02/23/2020 0332   CREATININE 1.61 (H) 12/05/2019 1003   CREATININE 0.94 01/09/2013 0839   CALCIUM 7.3 (L) 02/23/2020 0332   PROT 5.3 (L) 02/16/2020 0111   PROT 7.1 08/01/2017 1143   ALBUMIN 2.2 (L) 02/16/2020 0111   ALBUMIN 4.7 08/01/2017 1143   AST 30 02/16/2020 0111   AST 39 12/05/2019 1003   ALT 22 02/16/2020 0111   ALT 26 12/05/2019 1003   ALKPHOS 76 02/16/2020 0111   BILITOT 0.6 02/16/2020 0111   BILITOT 0.6 12/05/2019 1003   GFRNONAA 39 (L) 02/23/2020 0332   GFRNONAA 30 (L) 12/05/2019 1003   GFRAA 34 (L) 12/05/2019 1003   Assessment:  1.  Recurrent  melena. 2.  Acute blood loss anemia.  Ongoing blood transfusion requirements. 3.  History gastric and duodenal ulcers on endoscopy few days ago. 4.  Chronic anticoagulation, clopidigrel.  Plan:  1.  NPO. 2.  Anticoagulation on hold. 3.  Volume resuscitation in progress (transfusion, IVF). 4.  PPI. 5.  Endoscopy later today. 6.  Risks (bleeding, infection, bowel perforation that could require surgery, sedation-related changes in cardiopulmonary systems), benefits (identification and possible treatment of source of symptoms, exclusion of certain causes of symptoms), and alternatives (watchful waiting, radiographic imaging studies, empiric medical treatment) of upper endoscopy (EGD) were explained to patient/family in detail and patient wishes to proceed.   Landry Dyke 02/23/2020, 10:20 AM   Cell [phone] If no answer or after 5 PM call [phone]

## 2020-02-23 NOTE — Op Note (Signed)
[facility] Patient Name: [name] Procedure Date : 02/23/2020 MRN: [id number] Attending MD: Arta Silence , MD Date of Birth: Jun 06, 1940 CSN: [id number] Age: 79 Admit Type: Inpatient Procedure:                Upper GI endoscopy Indications:              Acute post hemorrhagic anemia, Melena Providers:                Arta Silence, MD, Glori Bickers, RN, Elspeth Cho Tech., Technician, Clearnce Sorrel, CRNA Referring MD:             [facility] Medicines:                Monitored Anesthesia Care Complications:            No immediate complications. Estimated Blood Loss:     Estimated blood loss: none. Procedure:                Pre-Anesthesia Assessment:                           - Prior to the procedure, a History and Physical                            was performed, and patient medications and                            allergies were reviewed. The patient's tolerance of                            previous anesthesia was also reviewed. The risks                            and benefits of the procedure and the sedation                            options and risks were discussed with the patient.                            All questions were answered, and informed consent                            was obtained. Prior Anticoagulants: The patient has                            taken Plavix (clopidogrel), last dose was 2 days                            prior to procedure. ASA Grade Assessment: III - A                            patient with severe systemic disease. After  reviewing the risks and benefits, the patient was                            deemed in satisfactory condition to undergo the                            procedure.                           After obtaining informed consent, the endoscope was                            passed under direct vision. Throughout the                             procedure, the patient's blood pressure, pulse, and                            oxygen saturations were monitored continuously. The                            [id number]-[id number] ([id number] Olympus gastroscope was                            introduced through the mouth, and advanced to the                            second part of duodenum. The upper GI endoscopy was                            accomplished without difficulty. The patient                            tolerated the procedure well. Scope In: Scope Out: Findings:      LA Grade C (one or more mucosal breaks continuous between tops of 2 or       more mucosal folds, less than 75% circumference) esophagitis was found.      The exam of the esophagus was otherwise normal.      Few non-bleeding cratered gastric ulcers with no stigmata of bleeding       were found in the gastric body and in the gastric antrum. The largest       lesion was 6 mm in largest dimension.      Two non-bleeding cratered duodenal ulcers with a fairly large visible       vessel were found in the duodenal bulb and in the first portion of the       duodenum. Given visible vessel size, and location right under the       presumptive region of the GDA and the overall tenuous clinical status of       the patient, I felt endoscopic treatment of this visible vessel was       risk-prohibitive. The largest ulcer was 8 mm in largest dimension.      The exam of the duodenum was otherwise normal.      The exam of the stomach was otherwise normal.  No old or fresh blood was seen to the extent of our examination. Impression:               - LA Grade C esophagitis.                           - Non-bleeding gastric ulcers with no stigmata of                            bleeding.                           - Non-bleeding duodenal ulcers with a visible                            vessel.                           - No active bleeding noted. Recommendation:           - Return patient to  hospital ward for ongoing care.                           - Clear liquid diet today.                           - Continue present medications.                           - Refer to an interventional radiologist today, for                            consideration of empiric embolization of the                            gastroduodenal artery (I have called in consult and                            discussed case directly with Dr. Serafina Royals).                           [facility] will follow. Procedure Code(s):        --- Professional ---                           [phone], Esophagogastroduodenoscopy, flexible,                            transoral; diagnostic, including collection of                            specimen(s) by brushing or washing, when performed                            (separate procedure) Diagnosis Code(s):        --- Professional ---  K20.90, Esophagitis, unspecified without bleeding                           K25.9, Gastric ulcer, unspecified as acute or                            chronic, without hemorrhage or perforation                           K26.4, Chronic or unspecified duodenal ulcer with                            hemorrhage                           D62, Acute posthemorrhagic anemia                           K92.1, Melena (includes Hematochezia) CPT copyright 2019 [employer]. All rights reserved. The codes documented in this report are preliminary and upon coder review may  be revised to meet current compliance requirements. Arta Silence, MD 02/23/2020 2:58:36 PM This report has been signed electronically. Number of Addenda: 0

## 2020-02-24 ENCOUNTER — Encounter (HOSPITAL_COMMUNITY): Payer: Self-pay | Admitting: Gastroenterology

## 2020-02-24 ENCOUNTER — Inpatient Hospital Stay (HOSPITAL_COMMUNITY): Payer: Medicare Other

## 2020-02-24 DIAGNOSIS — I2699 Other pulmonary embolism without acute cor pulmonale: Secondary | ICD-10-CM

## 2020-02-24 DIAGNOSIS — M7989 Other specified soft tissue disorders: Secondary | ICD-10-CM

## 2020-02-24 LAB — CBC
HCT: 21.6 % — ABNORMAL LOW (ref 36.0–46.0)
HCT: 24.2 % — ABNORMAL LOW (ref 36.0–46.0)
HCT: 22.5 % — ABNORMAL LOW (ref 36.0–46.0)
HCT: 22.1 % — ABNORMAL LOW (ref 36.0–46.0)
Hemoglobin: 6.8 g/dL — CL (ref 12.0–15.0)
Hemoglobin: 7.8 g/dL — ABNORMAL LOW (ref 12.0–15.0)
Hemoglobin: 7.4 g/dL — ABNORMAL LOW (ref 12.0–15.0)
Hemoglobin: 7 g/dL — ABNORMAL LOW (ref 12.0–15.0)
MCH: 28.6 pg (ref 26.0–34.0)
MCH: 28.8 pg (ref 26.0–34.0)
MCH: 29.6 pg (ref 26.0–34.0)
MCH: 28.7 pg (ref 26.0–34.0)
MCHC: 31.5 g/dL (ref 30.0–36.0)
MCHC: 32.2 g/dL (ref 30.0–36.0)
MCHC: 32.9 g/dL (ref 30.0–36.0)
MCHC: 31.7 g/dL (ref 30.0–36.0)
MCV: 90.8 fL (ref 80.0–100.0)
MCV: 89.3 fL (ref 80.0–100.0)
MCV: 90 fL (ref 80.0–100.0)
MCV: 90.6 fL (ref 80.0–100.0)
Platelets: 246 10*3/uL (ref 150–400)
Platelets: 239 10*3/uL (ref 150–400)
Platelets: 263 10*3/uL (ref 150–400)
Platelets: 271 10*3/uL (ref 150–400)
RBC: 2.38 MIL/uL — ABNORMAL LOW (ref 3.87–5.11)
RBC: 2.71 MIL/uL — ABNORMAL LOW (ref 3.87–5.11)
RBC: 2.5 MIL/uL — ABNORMAL LOW (ref 3.87–5.11)
RBC: 2.44 MIL/uL — ABNORMAL LOW (ref 3.87–5.11)
RDW: 16.2 % — ABNORMAL HIGH (ref 11.5–15.5)
RDW: 16 % — ABNORMAL HIGH (ref 11.5–15.5)
RDW: 16.1 % — ABNORMAL HIGH (ref 11.5–15.5)
RDW: 16.2 % — ABNORMAL HIGH (ref 11.5–15.5)
WBC: 22.1 10*3/uL — ABNORMAL HIGH (ref 4.0–10.5)
WBC: 19.8 10*3/uL — ABNORMAL HIGH (ref 4.0–10.5)
WBC: 19.1 10*3/uL — ABNORMAL HIGH (ref 4.0–10.5)
WBC: 16.9 10*3/uL — ABNORMAL HIGH (ref 4.0–10.5)
nRBC: 0.1 % (ref 0.0–0.2)
nRBC: 0.1 % (ref 0.0–0.2)
nRBC: 0.1 % (ref 0.0–0.2)
nRBC: 0.1 % (ref 0.0–0.2)

## 2020-02-24 LAB — BASIC METABOLIC PANEL
Anion gap: 8 (ref 5–15)
BUN: 39 mg/dL — ABNORMAL HIGH (ref 8–23)
CO2: 25 mmol/L (ref 22–32)
Calcium: 7.6 mg/dL — ABNORMAL LOW (ref 8.9–10.3)
Chloride: 107 mmol/L (ref 98–111)
Creatinine, Ser: 1.36 mg/dL — ABNORMAL HIGH (ref 0.44–1.00)
GFR, Estimated: 39 mL/min — ABNORMAL LOW (ref >60)
Glucose, Bld: 117 mg/dL — ABNORMAL HIGH (ref 70–99)
Potassium: 3.8 mmol/L (ref 3.5–5.1)
Sodium: 140 mmol/L (ref 135–145)

## 2020-02-24 LAB — GLUCOSE, CAPILLARY
Glucose-Capillary: 117 mg/dL — ABNORMAL HIGH (ref 70–99)
Glucose-Capillary: 113 mg/dL — ABNORMAL HIGH (ref 70–99)
Glucose-Capillary: 115 mg/dL — ABNORMAL HIGH (ref 70–99)
Glucose-Capillary: 125 mg/dL — ABNORMAL HIGH (ref 70–99)

## 2020-02-24 LAB — PREPARE RBC (CROSSMATCH)

## 2020-02-24 LAB — MAGNESIUM: Magnesium: 2.1 mg/dL (ref 1.7–2.4)

## 2020-02-24 MED ORDER — IOHEXOL 350 MG/ML SOLN
80.0000 mL | Freq: Once | INTRAVENOUS | Status: AC | PRN
  Administered 2020-02-24: 80 mL via INTRAVENOUS

## 2020-02-24 MED ORDER — ACETAMINOPHEN 325 MG PO TABS
650.0000 mg | ORAL_TABLET | Freq: Once | ORAL | Status: AC
  Administered 2020-02-24: 650 mg via ORAL
  Filled 2020-02-24: qty 2

## 2020-02-24 MED ORDER — SODIUM CHLORIDE 0.9 % IV SOLN
1.0000 g | INTRAVENOUS | Status: AC
  Administered 2020-02-24 – 2020-02-28 (×5): 1 g via INTRAVENOUS
  Filled 2020-02-24 (×5): qty 10

## 2020-02-24 MED ORDER — SODIUM CHLORIDE 0.9% IV SOLUTION: Freq: Once | INTRAVENOUS | Status: AC

## 2020-02-24 MED ORDER — FUROSEMIDE 10 MG/ML IJ SOLN
20.0000 mg | Freq: Once | INTRAMUSCULAR | Status: AC
  Administered 2020-02-24: 20 mg via INTRAVENOUS
  Filled 2020-02-24: qty 2

## 2020-02-24 MED ORDER — DIPHENHYDRAMINE HCL 25 MG PO CAPS
25.0000 mg | ORAL_CAPSULE | Freq: Once | ORAL | Status: AC
  Administered 2020-02-24: 25 mg via ORAL
  Filled 2020-02-24: qty 1

## 2020-02-24 NOTE — Progress Notes (Signed)
PROGRESS NOTE    [name]   NWG:[id number]  DOB: Feb 23, 1941  DOA: 02/17/2020     7  PCP: [name], DO  CC: GIB  Hospital Course: [name] is an 79 year old female notable PMH for CAD s/p stent, CKD stage IV, DMT2, HFpEF, and breast cancer.   Previously admitted 11/12 to 11/19 for left hip fracture s/p hemiarthroplasty 11/14 and incidentally found to have subacute CVA and small SDH.  MRI brain done and revealed thin acute right frontal lobe SDH with moderate stable small vessel disease and suspected 5 mm incidental meningioma. 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  No role for surgical intervention per Neurosurgery.  She was managed on ASA/ plavix for stroke prevention.   Hospital course complicated by ABLA, hypoxia due to volume overload, and urinary retention.  Discharged to CIR on 11/19, however developed nausea, abdominal/ epigastric pain with hypotension with Hgb drop from 8.2-> 5.4.  Vomited x 1 with questionable aspiration and re-admitted to hospital on 11/21.   On 11/20, she complained of epigastric and abdominal discomfort without SOB with ongoing constipation.  KUB showed non-obstructive gas pattered with stool present in rectum.  UA negative.  Has been afebrile.  Overnight, she developed hypotension with ongoing abdominal and epigastric pain, nausea, SOB, and with one episode of reported emesis, unclear if bloody.  Post emesis, staff report she had a wet cough with audible rales, requiring 3L [place].  Hgb 5.4, down from 8.2 on 11/20.  She was accepted by [facility] and on arrival, with ongoing SBP in the 70's.  2 units PRBC, CXR, and CT abd/ pelvis ordered.   PCCM consulted for ICU transfer given hemodynamic instability.   Her hemoglobin stabilized as well as vitals.  She was able to be  weaned off of oxygen to room air.  She was transferred out of the ICU on 02/20/2020.  She had been recovering fairly well until the evening of 02/22/2020 when she developed melanotic stools and a significant drop in her hemoglobin from 8 g/dL down to 6.1 g/dL.  She also became hypotensive and tachycardic.  She was started on IV fluids and transfused 3 units PRBC.  GI was reconsulted with plans for repeat EGD on 02/23/2020. EGD found cratered nonbleeding gastric ulcers and 2 large nonbleeding cratered duodenal ulcers, with a large friable vessel in the duodenal bulb.  Intervention on the vessel was not performed as was considered high risk for the patient.  Findings were discussed with patient and her daughter who ultimately elected to have IR evaluation for possible embolization.    Interval History:  Received another unit of blood overnight.  This morning she feels relatively okay and is resting in bed in no distress.  Findings of EGD explained her and we also discussed IR consult for today to also discuss the next steps.  She has no shortness of breath or chest pain.  Old records reviewed in assessment of this patient  ROS: Constitutional: positive for fatigue and malaise, Respiratory: negative, Cardiovascular: negative for chest pain and Gastrointestinal: negative for abdominal pain  Assessment & Plan: * GIB (gastrointestinal bleeding) -Suspected due to upper GI bleed from esophagitis (Grade D on EGD 11/23) contributing - s/p PRBC; Hgb now stable ~8 -Continue trending -Now placed back on aspirin on 02/21/2020.  Plavix has been  discontinued - asa back on hold on 11/27 after Hgb drop overnight and black tarry stools -GI reconsulted on 02/23/2020 for undergoing repeat EGD to reevaluate for hemoglobin drop -3 units PRBC transfused on 02/23/2020; 1 more unit PRBC given overnight of 02/23/2020 - repeat EGD on 11/27 found cratered nonbleeding gastric ulcers and 2 large nonbleeding cratered  duodenal ulcers, with a large friable vessel in the duodenal bulb.  Intervention on the vessel was not performed as was considered high risk for the patient.  Findings were discussed with patient and her daughter who ultimately elected to have IR evaluation for possible embolization - CTA abdomen performed on 11/27; follow up results and IR consult for next steps - continue q6h H/H and transfuse as necessary  S/p left hip fracture - s/p repair on 11/14 - continue PT/OT  Acute respiratory failure with hypoxia (HCC)-resolved as of 02/21/2020 -Considered due to atelectasis -Working well with spirometry -Oxygen has been weaned off on 11/25  Pulmonary emboli ([place]) -Incidental finding on CTA abdomen on 02/23/2020.  PE is located in left lower lung, but CTA chest would be needed to fully evaluate.  Due to recently recovering renal function, will need to wait at least 24 hours before another repeat contrasted CT.  She is however not symptomatic and is on room air -Follow-up lower extremity duplex - not an anticoagulation candidate with large GIB currently; if large DVT in LE may warrant IVC filter evaluation   History of CVA (cerebrovascular accident) Subacute CVA/ small SDH on previous admit, some question regarding whether this was actually a stroke -Non-focal -restarted ASA 11/25; recurrent GB on 11/27. Follow up EGD (asa again on hold)  Urinary retention - foley in place; creatinine has improved significantly -Trial of void on 02/22/2020.  Remove Foley.  She continues to void well -On 02/23/2020, she complained of ongoing pain with urination.  Will check a urinalysis at this point; positive for lrg LE, few bacteria; given ongoing symptoms will start Rocephin and complete 3-5 day course; follow up UCx if reflexes  Acute renal failure superimposed on stage 4 chronic kidney disease (HCC) - renal function slowly improving - daily BMP  Pulmonary nodule - LLL 54mm, consider f/u scan in 12  mo  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 (HCC) - H7D 8.8% -Continue SSI and CBG monitoring   Antimicrobials: Rocephin 02/24/2020>> current  DVT prophylaxis: SCD Code Status: Full Family Communication: None present Disposition Plan: Status is: Inpatient  Remains inpatient appropriate because:Unsafe d/c plan, IV treatments appropriate due to intensity of illness or inability to take PO and Inpatient level of care appropriate due to severity of illness   Dispo: The patient is from: CIR              Anticipated d/c is to: CIR if still a candidate              Anticipated d/c date is: > 3 days              Patient currently is not medically stable to d/c.  Objective: Blood pressure (!) 151/70, pulse (!) 101, temperature 98.7 F (37.1 C), temperature source Oral, resp. rate 20, height 5\' 8"  (1.727 m), weight 100.1 kg, SpO2 100 %.  Examination: General appearance: alert, cooperative and no distress Head: Normocephalic, without obvious abnormality, atraumatic Eyes: EOMI Lungs: Mild coarse breath sounds with crackles.  No wheezing appreciated Heart: regular rate and rhythm and S1, S2 normal Abdomen: normal findings: bowel sounds normal and soft,  non-tender Extremities: 3+ bilateral lower extremity edema, chronic Skin: normal Neurologic: Grossly normal.  Global weakness but no focal deficits  Consultants:   GI  IR  Procedures:   02/19/20: EGD  02/23/20: repeat EGD  Data Reviewed: I have personally reviewed following labs and imaging studies Results for orders placed or performed during the hospital encounter of 02/17/20 (from the past 24 hour(s))  CBC     Status: Abnormal   Collection Time: 02/23/20  1:18 PM  Result Value Ref Range   WBC 21.3 (H) 4.0 - 10.5 K/uL   RBC 2.66 (L) 3.87 - 5.11 MIL/uL   Hemoglobin 7.7 (L) 12.0 - 15.0 g/dL   HCT 23.7 (L) 36 - 46 %   MCV 89.1 80.0 - 100.0 fL   MCH 28.9 26.0 - 34.0 pg   MCHC 32.5 30.0 - 36.0 g/dL   RDW  15.9 (H) 11.5 - 15.5 %   Platelets 244 150 - 400 K/uL   nRBC 0.1 0.0 - 0.2 %  Glucose, capillary     Status: Abnormal   Collection Time: 02/23/20  3:37 PM  Result Value Ref Range   Glucose-Capillary 112 (H) 70 - 99 mg/dL  Urinalysis, Routine w reflex microscopic Urine, Clean Catch     Status: Abnormal   Collection Time: 02/23/20  5:22 PM  Result Value Ref Range   Color, Urine YELLOW YELLOW   APPearance CLOUDY (A) CLEAR   Specific Gravity, Urine 1.008 1.005 - 1.030   pH 6.0 5.0 - 8.0   Glucose, UA NEGATIVE NEGATIVE mg/dL   Hgb urine dipstick LARGE (A) NEGATIVE   Bilirubin Urine NEGATIVE NEGATIVE   Ketones, ur NEGATIVE NEGATIVE mg/dL   Protein, ur 30 (A) NEGATIVE mg/dL   Nitrite NEGATIVE NEGATIVE   Leukocytes,Ua LARGE (A) NEGATIVE   RBC / HPF 6-10 0 - 5 RBC/hpf   WBC, UA >50 (H) 0 - 5 WBC/hpf   Bacteria, UA FEW (A) NONE SEEN   WBC Clumps PRESENT    Mucus PRESENT    Budding Yeast PRESENT    Crystals PRESENT (A) NEGATIVE  Glucose, capillary     Status: Abnormal   Collection Time: 02/23/20  7:38 PM  Result Value Ref Range   Glucose-Capillary 207 (H) 70 - 99 mg/dL  CBC     Status: Abnormal   Collection Time: 02/23/20  7:44 PM  Result Value Ref Range   WBC 18.2 (H) 4.0 - 10.5 K/uL   RBC 2.35 (L) 3.87 - 5.11 MIL/uL   Hemoglobin 6.8 (LL) 12.0 - 15.0 g/dL   HCT 21.2 (L) 36 - 46 %   MCV 90.2 80.0 - 100.0 fL   MCH 28.9 26.0 - 34.0 pg   MCHC 32.1 30.0 - 36.0 g/dL   RDW 16.1 (H) 11.5 - 15.5 %   Platelets 235 150 - 400 K/uL   nRBC 0.1 0.0 - 0.2 %  Prepare RBC (crossmatch)     Status: None   Collection Time: 02/23/20  9:27 PM  Result Value Ref Range   Order Confirmation      ORDER PROCESSED BY BLOOD BANK Performed at [facility], [address], [place], [place] [address]   Glucose, capillary     Status: Abnormal   Collection Time: 02/24/20  5:05 AM  Result Value Ref Range   Glucose-Capillary 117 (H) 70 - 99 mg/dL  Basic metabolic panel     Status: Abnormal    Collection Time: 02/24/20  5:06 AM  Result Value Ref  Range   Sodium 140 135 - 145 mmol/L   Potassium 3.8 3.5 - 5.1 mmol/L   Chloride 107 98 - 111 mmol/L   CO2 25 22 - 32 mmol/L   Glucose, Bld 117 (H) 70 - 99 mg/dL   BUN 39 (H) 8 - 23 mg/dL   Creatinine, Ser 1.36 (H) 0.44 - 1.00 mg/dL   Calcium 7.6 (L) 8.9 - 10.3 mg/dL   GFR, Estimated 39 (L) >60 mL/min   Anion gap 8 5 - 15  Magnesium     Status: None   Collection Time: 02/24/20  5:06 AM  Result Value Ref Range   Magnesium 2.1 1.7 - 2.4 mg/dL  CBC     Status: Abnormal   Collection Time: 02/24/20  5:06 AM  Result Value Ref Range   WBC 19.8 (H) 4.0 - 10.5 K/uL   RBC 2.71 (L) 3.87 - 5.11 MIL/uL   Hemoglobin 7.8 (L) 12.0 - 15.0 g/dL   HCT 24.2 (L) 36 - 46 %   MCV 89.3 80.0 - 100.0 fL   MCH 28.8 26.0 - 34.0 pg   MCHC 32.2 30.0 - 36.0 g/dL   RDW 16.0 (H) 11.5 - 15.5 %   Platelets 239 150 - 400 K/uL   nRBC 0.1 0.0 - 0.2 %  Glucose, capillary     Status: Abnormal   Collection Time: 02/24/20 11:42 AM  Result Value Ref Range   Glucose-Capillary 113 (H) 70 - 99 mg/dL    Recent Results (from the past 240 hour(s))  Urine Culture     Status: Abnormal   Collection Time: 02/17/20  4:27 PM   Specimen: Urine, Random  Result Value Ref Range Status   Specimen Description URINE, RANDOM  Final   Special Requests NONE  Final   Culture (A)  Final    <10,000 COLONIES/mL INSIGNIFICANT GROWTH Performed at [facility], [address] [place], [place] [address]    Report Status 02/19/2020 FINAL  Final     Radiology Studies: CT Angio Abd/Pel w/ and/or w/o  Addendum Date: 02/24/2020   ADDENDUM REPORT: 02/24/2020 04:59 ADDENDUM: Critical Value/emergent results were called by telephone at the time of interpretation on 02/24/2020 at 4:56 am to nurse [facility] provider Chotiner who verbally acknowledged these results. Electronically Signed   By: Lucienne Capers M.D.   On: 02/24/2020 04:59   Result Date: 02/24/2020 CLINICAL DATA:  Melena  EXAM: CTA ABDOMEN AND PELVIS WITHOUT AND WITH CONTRAST TECHNIQUE: Multidetector CT imaging of the abdomen and pelvis was performed using the standard protocol during bolus administration of intravenous contrast. Multiplanar reconstructed images and MIPs were obtained and reviewed to evaluate the vascular anatomy. CONTRAST:  77mL OMNIPAQUE IOHEXOL 350 MG/ML SOLN COMPARISON:  02/17/2020 FINDINGS: VASCULAR Aorta: Normal caliber aorta without aneurysm, dissection, vasculitis or significant stenosis. Aortic calcifications. Celiac: Patent without evidence of aneurysm, dissection, vasculitis or significant stenosis. SMA: Patent without evidence of aneurysm, dissection, vasculitis or significant stenosis. Renals: Both renal arteries are patent without evidence of aneurysm, dissection, vasculitis, fibromuscular dysplasia or significant stenosis. IMA: Patent without evidence of aneurysm, dissection, vasculitis or significant stenosis. Inflow: Patent without evidence of aneurysm, dissection, vasculitis or significant stenosis. Proximal Outflow: Bilateral common femoral and visualized portions of the superficial and profunda femoral arteries are patent without evidence of aneurysm, dissection, vasculitis or significant stenosis. Veins: No obvious venous abnormality within the limitations of this arterial phase study. Review of the MIP images confirms the above findings. NON-VASCULAR Lower chest: Small bilateral pleural effusions  with basilar atelectasis. Hepatobiliary: No focal liver abnormality is seen. Status post cholecystectomy. No biliary dilatation. Pancreas: Unremarkable. No pancreatic ductal dilatation or surrounding inflammatory changes. Spleen: Normal in size without focal abnormality. Adrenals/Urinary Tract: 3 cm diameter left adrenal gland nodule is unchanged since prior studies. Kidneys are symmetrical with homogeneous nephrograms. No hydronephrosis or hydroureter. Bladder wall is mildly thickened with small amount  of bladder gas. This could be due to instrumentation or cystitis. Stomach/Bowel: Stomach, small bowel, and colon are not abnormally distended. No areas of contrast extravasation are identified to suggest a site of active bleeding. Appendix is normal. Lymphatic: No significant lymphadenopathy. Reproductive: Status post hysterectomy. No adnexal masses. Other: No free air or free fluid in the abdomen. Abdominal wall musculature appears intact. Prominent soft tissue edema in the subcutaneous fat of the abdomen and pelvis. Incidental note of pulmonary emboli in the left lower lung, partially seen only on the superior most images. Musculoskeletal: Degenerative changes in the spine. No destructive bone lesions. Left hip arthroplasty. IMPRESSION: 1. No evidence of focal contrast extravasation to suggest a site of active bleeding. 2. Aortic atherosclerosis. No significant arterial stenosis. 3. Incidental note of pulmonary emboli in the left lower lung, partially seen only on the superior most images. 4. Small bilateral pleural effusions with basilar atelectasis. 5. Bladder wall is mildly thickened with small amount of bladder gas. This could be due to instrumentation or cystitis. 6. Prominent soft tissue edema in the subcutaneous fat of the abdomen and pelvis. 7. 3 cm diameter left adrenal gland nodule is unchanged since prior studies. Aortic Atherosclerosis (ICD10-I70.0). Electronically Signed: By: Lucienne Capers M.D. On: 02/24/2020 04:38   CT Angio Abd/Pel w/ and/or w/o  Final Result  Addendum 1 of 1  ADDENDUM REPORT: 02/24/2020 04:59    ADDENDUM:  Critical Value/emergent results were called by telephone at the time  of interpretation on 02/24/2020 at 4:56 am to nurse [facility]  provider Chotiner who verbally acknowledged these results.      Electronically Signed    By: Lucienne Capers M.D.    On: 02/24/2020 04:59      Final    DG CHEST PORT 1 VIEW  Final Result    CT ABDOMEN PELVIS WO CONTRAST   Final Result    [place] EKG SITE RITE  Final Result    DG Chest Port 1 View  Final Result    VAS [place] LOWER EXTREMITY VENOUS (DVT)    (Results Pending)    Scheduled Meds: . Cerefolin NAC  1 tablet Oral q AM  . Chlorhexidine Gluconate Cloth  6 each Topical Daily  . exemestane  25 mg Oral QPC breakfast  . febuxostat  40 mg Oral Daily  . feeding supplement (GLUCERNA SHAKE)  237 mL Oral TID WC  . furosemide  20 mg Intravenous Once  . Gerhardt's butt cream  1 application Topical TID  . insulin aspart  0-9 Units Subcutaneous TID AC & HS  . linagliptin  5 mg Oral Daily  . mirtazapine  15 mg Oral QHS  . multivitamin with minerals  1 tablet Oral Daily  . pantoprazole (PROTONIX) IV  40 mg Intravenous Q12H  . rosuvastatin  20 mg Oral Daily  . senna-docusate  2 tablet Oral BID  . sodium chloride flush  10-40 mL Intracatheter Q12H   PRN Meds: acetaminophen, alum & mag hydroxide-simeth, bisacodyl, guaiFENesin-dextromethorphan, hydrocortisone, lidocaine, menthol-cetylpyridinium **OR** phenol, methocarbamol, nitroGLYCERIN, ondansetron (ZOFRAN) IV, polyethylene glycol, prochlorperazine **OR** [DISCONTINUED] prochlorperazine **OR** [DISCONTINUED] prochlorperazine, sodium chloride  flush, sodium phosphate, traZODone, witch hazel-glycerin Continuous Infusions: . sodium chloride 10 mL/hr at 02/18/20 1233  . cefTRIAXone (ROCEPHIN)  IV    . lactated ringers 75 mL/hr at 02/24/20 0950     LOS: 7 days  Time spent: Greater than 50% of the 35 minute visit was spent in counseling/coordination of care for the patient as laid out in the A&P.   Dwyane Dee, MD [facility] 02/24/2020, 1:13 PM

## 2020-02-24 NOTE — Assessment & Plan Note (Addendum)
-  Incidental finding on CTA abdomen on 02/23/2020.  PE is located in left lower lung, but CTA chest would be needed to fully evaluate.  Due to recently recovering renal function, will need to wait at least 24 hours before another repeat contrasted CT.  She is however not symptomatic; back on O2 now but may not need it, however getting multiple units of blood so at risk for overload as well  -Follow-up lower extremity duplex: negative for DVT - not an anticoagulation candidate with large GIB currently

## 2020-02-24 NOTE — Progress Notes (Addendum)
Notified by RN that Hgb dropped from 7.4 to 7. Pt with GIB and gastric ulcers during this admission. Next scheduled Hgb level is at 0100 tonight.  Transfuse one unit PRBC now and recheck Hgb/Hct 2 hours after transfusion.   Addendum: Hgb after one unit PRBC is 7.0.  RN reports that previous level was drawn before pt had a black tarry stool last evening therefore the Hgb could have been lower than 7 at time of transfusion.   Will transfuse another unit PRBC now

## 2020-02-24 NOTE — Progress Notes (Signed)
VASCULAR LAB    Bilateral lower extremity venous duplex has been performed.  See CV proc for preliminary results.   Malasia Torain, RVT 02/24/2020, 4:04 PM

## 2020-02-24 NOTE — Progress Notes (Signed)
Subjective: Less bleeding.  Objective: Vital signs in last 24 hours: Temp:  [97.8 F (36.6 C)-99.4 F (37.4 C)] 98.7 F (37.1 C) (11/28 1137) Pulse Rate:  [83-113] 101 (11/28 1137) Resp:  [16-39] 20 (11/28 1137) BP: (100-153)/(47-72) 151/70 (11/28 1137) SpO2:  [92 %-100 %] 100 % (11/28 1137) Weight:  [100.1 kg] 100.1 kg (11/28 0012) Weight change: 2.6 kg Last BM Date: 02/23/20  PE: GEN:  NAD, chronically ill-appearing, somewhat tachypneic ABD:  Soft, non-tender  Lab Results: CBC    Component Value Date/Time   WBC 19.1 (H) 02/24/2020 1305   RBC 2.50 (L) 02/24/2020 1305   HGB 7.4 (L) 02/24/2020 1305   HGB 11.1 (L) 12/05/2019 1003   HCT 22.5 (L) 02/24/2020 1305   PLT 263 02/24/2020 1305   PLT 294 12/05/2019 1003   MCV 90.0 02/24/2020 1305   MCV 87.0 07/06/2011 2111   MCH 29.6 02/24/2020 1305   MCHC 32.9 02/24/2020 1305   RDW 16.1 (H) 02/24/2020 1305   LYMPHSABS 1.7 02/17/2020 0804   MONOABS 1.2 (H) 02/17/2020 0804   EOSABS 0.1 02/17/2020 0804   BASOSABS 0.1 02/17/2020 0804   CMP     Component Value Date/Time   NA 140 02/24/2020 0506   NA 140 08/01/2017 1143   K 3.8 02/24/2020 0506   CL 107 02/24/2020 0506   CO2 25 02/24/2020 0506   GLUCOSE 117 (H) 02/24/2020 0506   BUN 39 (H) 02/24/2020 0506   BUN 25 08/01/2017 1143   CREATININE 1.36 (H) 02/24/2020 0506   CREATININE 1.61 (H) 12/05/2019 1003   CREATININE 0.94 01/09/2013 0839   CALCIUM 7.6 (L) 02/24/2020 0506   PROT 5.3 (L) 02/16/2020 0111   PROT 7.1 08/01/2017 1143   ALBUMIN 2.2 (L) 02/16/2020 0111   ALBUMIN 4.7 08/01/2017 1143   AST 30 02/16/2020 0111   AST 39 12/05/2019 1003   ALT 22 02/16/2020 0111   ALT 26 12/05/2019 1003   ALKPHOS 76 02/16/2020 0111   BILITOT 0.6 02/16/2020 0111   BILITOT 0.6 12/05/2019 1003   GFRNONAA 39 (L) 02/24/2020 0506   GFRNONAA 30 (L) 12/05/2019 1003   GFRAA 34 (L) 12/05/2019 1003   Assessment:  1.  Recurrent melena. 2.  Acute blood loss anemia.  Ongoing blood  transfusion requirements. 3.  History gastric and duodenal ulcers on endoscopy few days ago. 4.  Chronic anticoagulation, clopidigrel, on hold.  Plan:  1.  Given patient's tenuous clinical status and fairly large visible vessel (with semblance of larger vessel pulsating underneath the ulcer bed), I felt the risks of triggering potentially fatal rebleeding with endoscopic therapy outweighed the benefits. 2.  I have asked IR to see patient to discuss possibility of empiric GDA embolization, which seems fraught with its own potential risks. 3.  For now, continue PPI therapy and monitor supportively. 4.  If ongoing bleeding, would likely need another joint IR/GI discussion to assess which route might be best; also could consider tagged RBC study to assess for another bleeding site (since no active bleeding was seen at time of my endoscopy or the prior endoscopy, though the likelihood of another bleeding site is in my estimation very low). 5.  Serial CBCs, transfuse as needed, PPI. 6.  [facility] will follow.   [name] 02/24/2020, 1:34 PM   Cell [phone] If no answer or after 5 PM call [phone]

## 2020-02-24 NOTE — Consult Note (Signed)
Chief Complaint: Patient was seen in consultation today for GI bleed  Referring Physician(s): Dr. Arta Silence  Supervising Physician: Dr. Serafina Royals  Patient Status: [facility] - In-pt  History of Present Illness: [name] is a 79 y.o. female with past medical history of CAD s/p stent, CKD stage IV, DM2, HFpEF who presented to [facility] ED with hip pain after a fall at home. Patient is s/p hemiarthroplasty 02/10/20. She initially discharged to rehab, however developed dark, melena and concern for GI bleeding.  She underwent EGD 11/23 with active bleeding identified, and again 11/27 with Dr. Paulita Fujita with no active bleeding.  She did have several gastric and duodenal ulcers with concern for large visible non-bleeding vessel.  IR consulted for empiric GDA embolization.   Patient assessed at bedside alongside Dr. Serafina Royals. She is accompanied by her daughter [name].  She has received 12u of blood, 5 FFP with ongoing anemia and continued small amounts of dark, liquidity stool.  Both patient and daughter report this is slowing- in both frequency and volume, however has not stopped and is still occurring daily. They are interested in pursuing embolization if felt this would eradicate her bleeding and allow her to safely discharge home.   Past Medical History:  Diagnosis Date   Anemia    have received iron infusions   Anxiety    Arthritis     osteoarthritis   Breast cancer ([place])    Breast cancer in female [facility])    Bilateral   CAD S/P percutaneous coronary angioplasty 2007; March 2011   [facility] 3.0 mm 20 mm postdilated 3.6 mm in early mid LAD; status post ISR Cutting Balloon PTCA and March 11 along with PCI of distal mid lesion with a 3.0 mm 12 mm MultiLink vision BMS; the proximal stent causes jailing of SP1 and SP2 with ostial 70-80% lesions   Cancer ([place])    Melanoma, Squamous cell Carcinoma   Charcot's joint of foot, right    Charcot's joint of left foot    Chronic diastolic CHF  (congestive heart failure), NYHA class 2 ([place]) 02/28/2018   Chronic kidney disease    stage 2    Coronary artery disease    Dementia (HCC)    mild   Diabetes mellitus type 2 with neurological manifestations ([place])    Diabetes mellitus without complication (HCC)    Dyslipidemia, goal LDL below 70    Foot pain, bilateral    Full dentures    Genetic testing 12/03/2016   Germline genetic testing was performed through [employer]'s Common Hereditary Cancers Panel + [employer]'s Melanoma Panel. This custom panel includes analysis of the following 51 genes: APC, ATM, AXIN2, BAP1, BARD1, BMPR1A, BRCA1, BRCA2, BRIP1, CDH1, CDK4, CDKN2A, CHEK2, CTNNA1, DICER1, EPCAM, GREM1, HOXB13, KIT, MEN1, MITF, MLH1, MSH2, MSH3, MSH6, MUTYH, NBN, NF1, NTHL1, PALB2, PDGFRA, PMS2, POLD1, POL   GERD (gastroesophageal reflux disease)    Gout    Headache(784.0)    History of hematuria    Followed by Dr. Gaynelle Arabian   Hypertension, essential, benign    Migraine    Mild cognitive impairment    Peripheral neuropathy    Personal history of radiation therapy    Post herpetic neuralgia    Restless leg syndrome    Stroke ([place])    TIA (transient ischemic attack)    multiple in the past   TIA (transient ischemic attack)    Wears glasses     Past Surgical History:  Procedure Laterality Date   ABDOMINAL HYSTERECTOMY  ANKLE FUSION Right 02/22/2018   Procedure: RIGHT SUBTALAR AND TALONAVICULAR FUSION;  Surgeon: Newt Minion, MD;  Location: [place];  Service: Orthopedics;  Laterality: Right;   APPENDECTOMY     BIOPSY  02/19/2020   Procedure: BIOPSY;  Surgeon: Otis Brace, MD;  Location: [place] [facility];  Service: Gastroenterology;;   BLADDER SUSPENSION     BREAST LUMPECTOMY Bilateral 2018   BREAST LUMPECTOMY WITH RADIOACTIVE SEED AND SENTINEL LYMPH NODE BIOPSY Bilateral 12/14/2016   Procedure: BILATERAL BREAST LUMPECTOMY WITH BILATERAL  RADIOACTIVE SEED AND BILATERAL AXILLARY  SENTINEL LYMPH  NODE BIOPSY ERAS PATHWAY;  Surgeon: Alphonsa Overall, MD;  Location: [place];  Service: General;  Laterality: Bilateral;  [place]  02/24/2006   85% stenosis in the proximal portion of LAD-arrangements made for PCI on 02/25/2006   CARDIAC CATHETERIZATION  02/25/2006   80% LAD lesion stented with a 3x50mm Liberte stent resulting in reduction of 80% lesion to 0% residual   CARDIAC CATHETERIZATION  04/26/2006   Medical management   CARDIAC CATHETERIZATION  06/24/2009   60-70% re-stenosis in the proximal LAD. A 3.25x15 cutting balloon, 3 inflations - 14atm-38sec, 13atm-39sec, and 12atm-40sec reduced to less than 10%. 60% stenosis of the mid/distal LAD stented with a 3x75mm Multilink stent.   CARDIAC CATHETERIZATION  07/09/2009   Medical management   CARDIAC CATHETERIZATION     CAROTID DOPPLER  06/24/2009   40-59% R ICA stenosis. No significant ICA stenosis noted   CHOLECYSTECTOMY     CORONARY ANGIOPLASTY     DILATION AND CURETTAGE OF UTERUS     ESOPHAGOGASTRODUODENOSCOPY (EGD) WITH PROPOFOL N/A 02/19/2020   Procedure: ESOPHAGOGASTRODUODENOSCOPY (EGD) WITH PROPOFOL;  Surgeon: Otis Brace, MD;  Location: [place];  Service: Gastroenterology;  Laterality: N/A;   HIP ARTHROPLASTY Left 02/10/2020   Procedure: ARTHROPLASTY BIPOLAR HIP (HEMIARTHROPLASTY) POSTERIOR LATERAL;  Surgeon: [name], MD;  Location: [place];  Service: Orthopedics;  Laterality: Left;   JOINT REPLACEMENT Left    LESION REMOVAL Right 03/11/2015   Procedure:  EXCISION OF RIGHT PRE TIBIAL LESION;  Surgeon: Judeth Horn, MD;  Location: [place];  Service: General;  Laterality: Right;   MASS EXCISION Left 06/10/2014   Procedure: EXCISION LEFT LOWER LEG LESION;  Surgeon: Doreen Salvage, MD;  Location: [place];  Service: General;  Laterality: Left;   MASS EXCISION Left 09/05/2015   Procedure: EXCISION OF LEFT FOREARM  MASS;  Surgeon: Judeth Horn, MD;   Location: [place];  Service: General;  Laterality: Left;   NM MYOVIEW LTD  10/2011   No ischemia or infarction.  Normal EF.  LOW RISK. (Short bursts of 5 beats NSVT during recovery otherwise normal)   POLYPECTOMY  02/19/2020   Procedure: POLYPECTOMY;  Surgeon: Otis Brace, MD;  Location: [place] [facility];  Service: Gastroenterology;;   SHOULDER ARTHROSCOPY  10/15   SHOULDER ARTHROSCOPY  1995   left   TEE WITHOUT CARDIOVERSION N/A 08/03/2017   Procedure: TRANSESOPHAGEAL ECHOCARDIOGRAM (TEE);  Surgeon: Skeet Latch, MD;  Location: [place];  Service: Cardiovascular;; EF 60-65% with no wall motion normalities.  No atrial thrombus noted.  Lightly findings consistent with a lipomatous hypertrophy of the atrial septum.    TENDON REPAIR Left 05/12/2016   Procedure: Left Anterior Tibial Tendon Reconstruction;  Surgeon: Newt Minion, MD;  Location: [place];  Service: Orthopedics;  Laterality: Left;   TOTAL KNEE ARTHROPLASTY  2005   left   TRANSESOPHAGEAL ECHOCARDIOGRAM  08/03/2016   :  EF 60-65% no wall motion normalities.  No atrial thrombus.  Lipomatous hypertrophy of the atrial septum.  No mass noted.   TRANSESOPHAGEAL ECHOCARDIOGRAM  07/2017   EF 60 to 65%.  No R WMA.  No atrial thrombus noted.  Lipomatous IAS.   TRANSTHORACIC ECHOCARDIOGRAM  02/25/2017   :EF 65-70%.  GR 1 DD.  No or WMA.  Moderate aortic calcification/sclerosis but no stenosis.   TRANSTHORACIC ECHOCARDIOGRAM  07/2017   Vigorous LV function.  EF 65-70%.  Mild diastolic dysfunction with no RWMA. GR 1 DD.  Mild aortic sclerosis/no stenosis.  Questionable right atrial mass discounted with TEE)    WRIST ARTHROPLASTY  2010   cancer lt wrist    Allergies: Nsaids, Reglan [metoclopramide], Tramadol, Ultram [tramadol], Lipitor [atorvastatin], Lipitor [atorvastatin], Lyrica [pregabalin], Lyrica [pregabalin], Reglan [metoclopramide], Nsaids, and Urecholine [bethanechol]  Medications: Prior to  Admission medications   Medication Sig Start Date End Date Taking? Authorizing Provider  bethanechol (URECHOLINE) 25 MG tablet Take 1 tablet (25 mg total) by mouth 3 (three) times daily. 02/15/20 03/16/20  Para Skeans, MD  BIOTIN PO Take 1 tablet by mouth daily.    [provider]  bumetanide (BUMEX) 2 MG tablet Take 2 mg by mouth daily. 2 in morning and 1 in afternoon    [provider]  Calcium Carb-Cholecalciferol (CALCIUM 600+D3) 600-800 MG-UNIT TABS Take 1 tablet by mouth daily.    [provider]  Cholecalciferol (VITAMIN D) 50 MCG (2000 UT) tablet Take 2,000 Units by mouth daily.    [provider]  clopidogrel (PLAVIX) 75 MG tablet TAKE 1 TABLET DAILY 04/15/19   Leonie Man, MD  clopidogrel (PLAVIX) 75 MG tablet Take 75 mg by mouth daily. 01/10/20   [provider]  clopidogrel (PLAVIX) 75 MG tablet Take 1 tablet (75 mg total) by mouth daily. 02/16/20 03/17/20  Para Skeans, MD  Coenzyme Q10 (COQ-10 PO) Take 1 tablet by mouth daily.    [provider]  colchicine 0.6 MG tablet Take 0.6 mg by mouth daily as needed (for up to 3 days for gout flare).    [provider]  COLCRYS 0.6 MG tablet TAKE 1 TABLET DAILY AS NEEDED FOR GOUT FLARE. 12/27/18   Newt Minion, MD  Cranberry 500 MG TABS Take 500 mg by mouth every evening.    [provider]  CRANBERRY-CALCIUM PO Take 1 tablet by mouth daily.    [provider]  diazepam (VALIUM) 5 MG tablet Take 1 tablet (5 mg total) by mouth 2 (two) times daily as needed for anxiety. 03/02/18   Dessa Phi, DO  diazepam (VALIUM) 5 MG tablet Take 5 mg by mouth 3 (three) times daily as needed. 10/05/19   [provider]  exemestane (AROMASIN) 25 MG tablet TAKE 1 TABLET DAILY AFTER BREAKFAST 03/12/19   Truitt Merle, MD  febuxostat (ULORIC) 40 MG tablet TAKE 1 TABLET DAILY 10/02/19   Newt Minion, MD  febuxostat (ULORIC) 40 MG tablet Take 40 mg by mouth daily. 12/31/19    [provider]  gabapentin (NEURONTIN) 300 MG capsule Take 1 capsule (300 mg total) by mouth 2 (two) times daily. Take 1 during the day if needed and 2 at night 04/06/18   Dennie Bible, NP  gabapentin (NEURONTIN) 300 MG capsule Take 300 mg by mouth 2 (two) times daily.  12/18/19   [provider]  JANUVIA 25 MG tablet Take 25 mg by mouth daily. 12/26/19   [provider]  JANUVIA 25 MG tablet Take 25 mg by mouth daily. 12/26/19   [provider]  Javier Docker Oil 500 MG CAPS Take 500 mg by mouth daily.    [provider]  KRILL OIL PO Take 1 capsule by mouth daily.    [provider]  Methylfol-Methylcob-Acetylcyst (CEREFOLIN NAC) 6-2-600 MG TABS TAKE 1 TABLET BY MOUTH EVERY DAY 06/07/19   Garvin Fila, MD  Methylfol-Methylcob-Acetylcyst (CEREFOLIN NAC) 6-2-600 MG TABS Take 1 tablet by mouth in the morning.    [provider]  metolazone (ZAROXOLYN) 2.5 MG tablet TAKE 1 TABLET DAILY 30 MINUTES BEFORE THE MORNING DOSE OF BUMEX AS DIRECTED 08/09/18   Leonie Man, MD  metolazone (ZAROXOLYN) 2.5 MG tablet Take 2.5 mg by mouth daily. 30 minutes before Bumetanide Mon. & Thurs.    [provider]  metoprolol succinate (TOPROL-XL) 25 MG 24 hr tablet Take 12.5 mg by mouth daily.    [provider]  metoprolol succinate (TOPROL-XL) 25 MG 24 hr tablet Take 25 mg by mouth at bedtime.  12/27/19   [provider]  mirtazapine (REMERON) 15 MG tablet Take 15 mg by mouth at bedtime. Taking 1 1/2 tablet at bedtime    [provider]  mirtazapine (REMERON) 15 MG tablet Take 15 mg by mouth at bedtime.  01/29/20   [provider]  nitroGLYCERIN (NITROLINGUAL) 0.4 MG/SPRAY spray Place 1 spray under the tongue every 5 (five) minutes x 3 doses as needed for chest pain.    [provider]  nitroGLYCERIN (NITROSTAT) 0.4 MG SL tablet Place 0.4 mg under the tongue every 5 (five) minutes as needed for chest  pain.    [provider]  oxyCODONE-acetaminophen (PERCOCET/ROXICET) 5-325 MG tablet Take 1 tablet by mouth every 6 (six) hours as needed for severe pain. 07/30/19   Persons, Bevely Palmer, PA  pantoprazole (PROTONIX) 40 MG tablet Take 40 mg by mouth daily.    [provider]  potassium chloride (KLOR-CON) 20 MEQ packet Take 20 mEq by mouth 2 (two) times daily. 2 tablets in the morning and 1 at bedtime    [provider]  pramipexole (MIRAPEX) 0.25 MG tablet TAKE 1 TO 2 TABLETS AS INSTRUCTED, 1 TABLET AT 4:00 P.M. AND 2 TABLETS AT BEDTIME 09/27/19   Frann Rider, NP  pramipexole (MIRAPEX) 0.25 MG tablet Take 0.75 mg by mouth at bedtime.  12/26/19   [provider]  Resveratrol 250 MG CAPS Take 500 mg by mouth daily.    [provider]  rosuvastatin (CRESTOR) 20 MG tablet Take 20 mg by mouth daily.    [provider]  rosuvastatin (CRESTOR) 5 MG tablet Take 5 mg by mouth daily.    [provider]  sennosides-docusate sodium (SENOKOT-S) 8.6-50 MG tablet Take 1 tablet by mouth daily as needed for constipation.    [provider]  Ubiquinol (QUNOL COQ10/UBIQUINOL/MEGA) 100 MG CAPS Take 100 mg by mouth every evening. CoQ10, Qunol     [provider]     Family History  Problem Relation Age of Onset   Diabetes Mother    Kidney disease Mother    Diabetes Sister    Diabetes Brother    Hypertension Mother    Hypertension Father    Emphysema Father    Heart attack Father    Heart disease Father    Arthritis Sister    Diabetes Sister    Hypertension Sister    Breast cancer Sister 30  treated with mastectomy   Cervical cancer Sister 30   Hypertension Brother    Hyperlipidemia Brother    Diabetes Brother    Stroke Brother    Diabetes Sister    Hypertension Sister    Stomach cancer Sister 42   Hypertension Brother    ALS Brother        d.62s   Breast cancer Daughter 62       triple  negative treated with lumpectomy, chemo, radiation   Heart attack Daughter 48       d.45   COPD Daughter    Cancer Paternal Aunt        unspecified type   Cancer Paternal Uncle        unspecified type   Cancer Paternal Uncle        unspecified type    Social History   Socioeconomic History   Marital status: Married    Spouse name: [name]   Number of children: 3   Years of education: 11   Highest education level: Not on file  Occupational History   Occupation: retired    Fish farm manager: RETIRED  Tobacco Use   Smoking status: Never Smoker   Smokeless tobacco: Never Used  Scientific laboratory technician Use: Never used  Substance and Sexual Activity   Alcohol use: Never   Drug use: Never   Sexual activity: Never  Other Topics Concern   Not on file  Social History Narrative   ** Merged History Encounter **       She is a married mother of 77, grandmother of 2.  Usually very active and out about the house.  But currently over last 6-8 weeks has been incapacitated.  She never smoked and does not drink alcohol. 10 th grade   Social Determinants of Health   Financial Resource Strain:    Difficulty of Paying Living Expenses: Not on file  Food Insecurity: No Food Insecurity   Worried About Running Out of Food in the Last Year: Never true   Ran Out of Food in the Last Year: Never true  Transportation Needs: No Transportation Needs   Lack of Transportation (Medical): No   Lack of Transportation (Non-Medical): No  Physical Activity:    Days of Exercise per Week: Not on file   Minutes of Exercise per Session: Not on file  Stress:    Feeling of Stress : Not on file  Social Connections:    Frequency of Communication with Friends and Family: Not on file   Frequency of Social Gatherings with Friends and Family: Not on file   Attends Religious Services: Not on file   Active Member of Clubs or Organizations: Not on file   Attends Archivist Meetings: Not on  file   Marital Status: Not on file     Review of Systems: A 12 point ROS discussed and pertinent positives are indicated in the HPI above.  All other systems are negative.  Review of Systems  Constitutional: Negative for fatigue and fever.  Respiratory: Negative for cough and shortness of breath.   Cardiovascular: Negative for chest pain.  Gastrointestinal: Positive for blood in stool. Negative for abdominal pain, nausea and vomiting.  Musculoskeletal: Negative for back pain.  Psychiatric/Behavioral: Negative for behavioral problems and confusion.    Vital Signs: BP (!) 151/70 (BP Location: Right Leg)    Pulse (!) 101    Temp 98.7 F (37.1 C) (Oral)    Resp 20    Ht 5'  8" (1.727 m)    Wt 220 lb 10.9 oz (100.1 kg)    SpO2 100%    BMI 33.55 kg/m   Physical Exam Vitals and nursing note reviewed.  Constitutional:      Appearance: Normal appearance.  HENT:     Mouth/Throat:     Mouth: Mucous membranes are moist.     Pharynx: Oropharynx is clear.  Cardiovascular:     Rate and Rhythm: Tachycardia present.  Pulmonary:     Effort: Pulmonary effort is normal. No respiratory distress.     Breath sounds: Normal breath sounds.  Abdominal:     General: Abdomen is flat.     Palpations: Abdomen is soft.  Skin:    General: Skin is warm and dry.  Neurological:     Mental Status: She is alert.  Psychiatric:        Mood and Affect: Mood normal.        Behavior: Behavior normal.        Thought Content: Thought content normal.        Judgment: Judgment normal.      MD Evaluation Airway: WNL Heart: WNL Abdomen: WNL Chest/ Lungs: WNL Other Pertinent Findings: chronically ill-appearing, elderly, but nad ASA  Classification: 3 Mallampati/Airway Score: One   Imaging: CT ABDOMEN PELVIS WO CONTRAST  Result Date: 02/17/2020 CLINICAL DATA:  Nausea and vomiting with drop in hemoglobin. Recent hip surgery. EXAM: CT ABDOMEN AND PELVIS WITHOUT CONTRAST TECHNIQUE: Multidetector CT  imaging of the abdomen and pelvis was performed following the standard protocol without IV contrast. COMPARISON:  CT dated 11/16/2015 FINDINGS: Lower chest: There is a partially visualized pulmonary nodule in the left lower lobe measuring approximately 5 mm (axial series 4, image 1).The intracardiac blood pool is hypodense relative to the adjacent myocardium consistent with anemia. The heart size is normal. There are coronary artery calcifications. Hepatobiliary: The liver is normal. Status post cholecystectomy.There is no biliary ductal dilation. Pancreas: Normal contours without ductal dilatation. No peripancreatic fluid collection. Spleen: Unremarkable. Adrenals/Urinary Tract: --Adrenal glands: There is a stable left adrenal mass, consistent with a benign adrenal adenoma. --Right kidney/ureter: No hydronephrosis or radiopaque kidney stones. --Left kidney/ureter: No hydronephrosis or radiopaque kidney stones. --Urinary bladder: There is a tiny focus of gas within the urinary bladder, likely representing sequela of recent instrumentation. Stomach/Bowel: --Stomach/Duodenum: No hiatal hernia or other gastric abnormality. Normal duodenal course and caliber. --Small bowel: Unremarkable. --Colon: There is an above average amount of stool in the colon. There are few scattered colonic diverticula without CT evidence for diverticulitis. --Appendix: Normal. Vascular/Lymphatic: Atherosclerotic calcification is present within the non-aneurysmal abdominal aorta, without hemodynamically significant stenosis. --No retroperitoneal lymphadenopathy. --No mesenteric lymphadenopathy. --No pelvic or inguinal lymphadenopathy. Reproductive: The pelvis is suboptimally evaluated secondary to streak artifact. However, the patient appears to be status post prior hysterectomy. Other: No ascites or free air. The abdominal wall is normal. Musculoskeletal. There are expected postsurgical changes related to recent total hip arthroplasty on the  left. There is no large adjacent hematoma. The hardware appears grossly intact where visualized. IMPRESSION: 1. No acute intra-abdominal abnormality detected. No specific abnormality detected to explain the patient's anemia. 2. Expected postsurgical changes related to recent left total hip arthroplasty. 3. There is a partially visualized 5 mm pulmonary nodule in the left lower lobe. In a low risk patient, no further follow-up is required. No follow-up needed if patient is low-risk. Non-contrast chest CT can be considered in 12 months if patient is high-risk. This recommendation follows  the consensus statement: Guidelines for Management of Incidental Pulmonary Nodules Detected on CT Images: From the Fleischner Society 2017; Radiology 2017; 284:228-243. 4. Anemia. Aortic Atherosclerosis (ICD10-I70.0). Electronically Signed   By: Constance Holster M.D.   On: 02/17/2020 19:27   DG Chest 1 View  Result Date: 02/12/2020 CLINICAL DATA:  Dyspnea EXAM: CHEST  1 VIEW COMPARISON:  Radiograph 02/08/2020 FINDINGS: Chronic elevation of the right hemidiaphragm with some adjacent atelectatic changes. There is some increasing vascular congestion with hazy interstitial opacity and fissural thickening. No focal consolidative opacity is seen. Stable cardiomediastinal contours with a calcified, tortuous aorta. Telemetry leads and support devices overlie the chest. Degenerative changes are present in the imaged spine and shoulders. IMPRESSION: 1. Increasing vascular congestion and features which may suggest early interstitial edema. 2. Chronic elevation of the right hemidiaphragm with adjacent atelectatic changes. 3.  Aortic Atherosclerosis (ICD10-I70.0). Electronically Signed   By: Lovena Le M.D.   On: 02/12/2020 19:30   DG Abd 1 View  Result Date: 02/16/2020 CLINICAL DATA:  Nausea, abdominal pain EXAM: ABDOMEN - 1 VIEW COMPARISON:  None. FINDINGS: The stomach is gas filled. Nonobstructive pattern of bowel gas with  scattered gas and stool present to the rectum. No free air in the abdomen on supine radiographs. Status post left hip total arthroplasty. No radio-opaque calculi or other significant radiographic abnormality are seen. IMPRESSION: The stomach is gas filled. Nonobstructive pattern of bowel gas with scattered gas and stool present to the rectum. No free air in the abdomen on supine radiographs. Electronically Signed   By: Eddie Candle M.D.   On: 02/16/2020 20:59   CT Head Wo Contrast  Result Date: 02/08/2020 CLINICAL DATA:  Head trauma EXAM: CT HEAD WITHOUT CONTRAST CT CERVICAL SPINE WITHOUT CONTRAST TECHNIQUE: Multidetector CT imaging of the head and cervical spine was performed following the standard protocol without intravenous contrast. Multiplanar CT image reconstructions of the cervical spine were also generated. COMPARISON:  CT head 11/26/2019 FINDINGS: CT HEAD FINDINGS Brain: Normal anatomic configuration of the brain. There is interval development of a small cortical infarct involving the right frontal lobe involving the precentral gyrus, best seen on axial image # 22/3. No significant associated mass effect. No midline shift. Mild periventricular white matter changes are again identified in keeping with probable small vessel ischemia. No abnormal intra or extra-axial mass lesion. No evidence of acute intracranial hemorrhage. Ventricular size is normal. Cerebellum is unremarkable. Vascular: No asymmetric hyperdense vasculature at the skull base. Skull: Intact Sinuses/Orbits: Visualized paranasal sinuses are clear. Orbits are unremarkable. Other: Mastoid air cells and middle ear cavities are clear. CT CERVICAL SPINE FINDINGS Alignment: 3 mm anterolisthesis C4 upon C5 and C7 upon T1 is likely degenerative in nature. Otherwise normal cervical lordosis. Skull base and vertebrae: The craniocervical junction is unremarkable. The atlantodental interval is normal. No acute fracture of the cervical spine. No lytic  or blastic bone lesion Soft tissues and spinal canal: No prevertebral fluid or swelling. No visible canal hematoma. Disc levels: Review of the sagittal images demonstrates preservation of vertebral body height. 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 Multiple erosions are seen at the base of the odontoid process likely related to synovial overgrowth. 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 The spinal canal appears widely patent. The prevertebral soft tissues are not thickened. Review of the axial images demonstrates multilevel uncovertebral and facet arthrosis  resulting in multilevel neural foraminal narrowing, most severe on the left at C3-4 and on the right at C4-5 as well as mild central canal stenosis at C6-7. Upper chest: Unremarkable Other: Advanced atherosclerotic calcification is seen within the left carotid bifurcation. The degree of stenosis is not well assessed on this noncontrast examination. IMPRESSION: No acute intracranial injury.  No calvarial fracture. 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 No acute fracture or listhesis of the cervical spine. Electronically Signed   By: Fidela Salisbury MD   On: 02/08/2020 03:25   CT Cervical Spine Wo Contrast  Result Date: 02/08/2020 CLINICAL DATA:  Head trauma EXAM: CT HEAD WITHOUT CONTRAST CT CERVICAL SPINE WITHOUT CONTRAST TECHNIQUE: Multidetector CT imaging of the head and cervical spine was performed following the standard protocol without intravenous contrast. Multiplanar CT image reconstructions of the cervical spine were also generated. COMPARISON:  CT head 11/26/2019 FINDINGS: CT HEAD FINDINGS Brain: Normal anatomic configuration of the brain. There is interval development of a small cortical  infarct involving the right frontal lobe involving the precentral gyrus, best seen on axial image # 22/3. No significant associated mass effect. No midline shift. Mild periventricular white matter changes are again identified in keeping with probable small vessel ischemia. No abnormal intra or extra-axial mass lesion. No evidence of acute intracranial hemorrhage. Ventricular size is normal. Cerebellum is unremarkable. Vascular: No asymmetric hyperdense vasculature at the skull base. Skull: Intact Sinuses/Orbits: Visualized paranasal sinuses are clear. Orbits are unremarkable. Other: Mastoid air cells and middle ear cavities are clear. CT CERVICAL SPINE FINDINGS Alignment: 3 mm anterolisthesis C4 upon C5 and C7 upon T1 is likely degenerative in nature. Otherwise normal cervical lordosis. Skull base and vertebrae: The craniocervical junction is unremarkable. The atlantodental interval is normal. No acute fracture of the cervical spine. No lytic or blastic bone lesion Soft tissues and spinal canal: No prevertebral fluid or swelling. No visible canal hematoma. Disc levels: Review of the sagittal images demonstrates preservation of vertebral body height. 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 Multiple erosions are seen at the base of the odontoid process likely related to synovial overgrowth. 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 The spinal canal appears widely patent. The prevertebral soft tissues are not thickened. Review of the axial images demonstrates multilevel uncovertebral and facet arthrosis resulting in multilevel neural foraminal narrowing, most severe on the left at C3-4 and on the right at C4-5 as well as mild central canal stenosis at C6-7. Upper chest: Unremarkable Other: Advanced atherosclerotic calcification is seen within the  left carotid bifurcation. The degree of stenosis is not well assessed on this noncontrast examination. IMPRESSION: No acute intracranial injury.  No calvarial fracture. 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 No acute fracture or listhesis of the cervical spine. Electronically Signed   By: Fidela Salisbury MD   On: 02/08/2020 03:25   MR ANGIO HEAD WO CONTRAST  Result Date: 02/08/2020 CLINICAL DATA:  Neuro deficit, acute, stroke suspected. Additional history provided: Fall at home. EXAM: MRI HEAD WITHOUT CONTRAST MRA HEAD WITHOUT CONTRAST TECHNIQUE: Multiplanar, multiecho pulse sequences of the brain and surrounding structures were obtained without intravenous contrast. Angiographic images of the head were obtained using MRA technique without contrast. COMPARISON:  Noncontrast head CT  02/08/2020. Brain MRI 11/06/2019. MRA head 09/26/2009. FINDINGS: MRI HEAD FINDINGS Brain: Cerebral volume is normal for age. There is a thin acute subdural hematoma overlying the anterolateral right frontal lobe measuring up to 4 mm in greatest thickness (for instance as seen on series 11, image 13). In retrospect, this is appreciable on the head CT performed earlier the same day. No significant mass effect upon the underlying right cerebral hemisphere. No midline shift. Moderate multifocal T2/FLAIR hyperintensity within the cerebral white matter and pons is nonspecific, but compatible with chronic small vessel ischemic disease. Unchanged 5 mm rounded extra-axial calcified focus overlying the anterior left frontal lobe, suspected small incidental meningioma. There is no acute infarct. No extra-axial fluid collection. Vascular: Reported below. Skull and upper cervical spine: No focal marrow lesion. Cervical spondylosis. Ligamentous hypertrophy/pannus formation posterior to the dens. Sinuses/Orbits: Prior lens replacements. Trace ethmoid sinus mucosal  thickening. Other: Left parietooccipital scalp soft tissue swelling. MRA HEAD FINDINGS The intracranial internal carotid arteries are patent. Moderate/severe atherosclerotic narrowing of the cavernous segments bilaterally, progressed as compared to the MRA of 09/26/2009. No M2 proximal branch occlusion. Atherosclerotic irregularity of the M2 and more distal MCA branch vessels bilaterally. Most notably, there is a moderate stenosis within a proximal M2 left MCA branch (series 1051, image 4). The anterior cerebral arteries are patent. The intracranial vertebral arteries are patent. The basilar artery is patent. The posterior cerebral arteries are patent. Mild atherosclerotic narrowing of the proximal left PCA (series 1039, image 15). Mild/moderate narrowing of the left PCA at the P3/P4 junction. Posterior communicating arteries are present bilaterally. These results were called by telephone at the time of interpretation on 02/08/2020 at 7:48 am to provider Dr. Roslynn Amble, who verbally acknowledged these results. IMPRESSION: MRI brain: 1. Thin acute subdural hematoma overlying the anterolateral right frontal lobe, measuring up to 4 mm in thickness. 2. Moderate chronic small vessel ischemic disease, stable as compared to the brain MRI of 11/06/2019. 3. 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 4. Left parietooccipital scalp soft tissue swelling. MRA head: 1. No intracranial large vessel occlusion. 2. Intracranial atherosclerotic disease most notably as follows. 3. 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 4. Moderate focal stenosis within a proximal M2 left MCA branch vessel. 5. Mild/moderate stenosis of the left PCA at the P3/P4 junction. Electronically Signed   By: Kellie Simmering DO   On: 02/08/2020 07:49   MR ANGIO NECK WO CONTRAST  Result Date: 02/08/2020 CLINICAL DATA:  Stroke,  follow-up. EXAM: MRA NECK WITHOUT CONTRAST TECHNIQUE: Angiographic images of the neck were obtained using MRA technique without intravenous contrast. Carotid stenosis measurements (when applicable) are obtained utilizing NASCET criteria, using the distal internal carotid diameter as the denominator. COMPARISON:  MRA neck 09/26/2009. FINDINGS: The right CCA and cervical ICA are patent without hemodynamically significant stenosis. Mild atherosclerotic irregularity of the proximal right ICA, progressed as compared to the MRA of 09/26/2009. The left CCA and cervical ICA are patent. On the source time-of-flight sequence, there is apparent 60% stenosis at the origin of the left ICA, progressed as compared to the prior exam. However, this degree of stenosis is not definitively appreciable on the MIP reconstructions. Limited evaluation of the proximal vertebral arteries due to motion degradation and noncontrast technique. The vertebral arteries are otherwise patent within the neck without hemodynamically significant stenosis. IMPRESSION: 1. Apparent 60% stenosis at the origin of the left ICA on the  source time-of-flight sequence, progressed from the MRA of 09/26/2009. However, this degree of stenosis is not definitively appreciated on the MIP reconstructions. Carotid artery duplex may be obtained for further evaluation, as clinically warranted. 2. The right CCA and ICA are patent within the neck without hemodynamically significant stenosis. Progressive mild atherosclerotic irregularity of the proximal right ICA. 3. Motion degradation and non-contrast technique preclude evaluation of the proximal vertebral arteries. The vertebral arteries are otherwise patent within the neck without significant stenosis. Electronically Signed   By: Kellie Simmering DO   On: 02/08/2020 08:04   MR BRAIN WO CONTRAST  Result Date: 02/08/2020 CLINICAL DATA:  Neuro deficit, acute, stroke suspected. Additional history provided: Fall at home. EXAM:  MRI HEAD WITHOUT CONTRAST MRA HEAD WITHOUT CONTRAST TECHNIQUE: Multiplanar, multiecho pulse sequences of the brain and surrounding structures were obtained without intravenous contrast. Angiographic images of the head were obtained using MRA technique without contrast. COMPARISON:  Noncontrast head CT 02/08/2020. Brain MRI 11/06/2019. MRA head 09/26/2009. FINDINGS: MRI HEAD FINDINGS Brain: Cerebral volume is normal for age. There is a thin acute subdural hematoma overlying the anterolateral right frontal lobe measuring up to 4 mm in greatest thickness (for instance as seen on series 11, image 13). In retrospect, this is appreciable on the head CT performed earlier the same day. No significant mass effect upon the underlying right cerebral hemisphere. No midline shift. Moderate multifocal T2/FLAIR hyperintensity within the cerebral white matter and pons is nonspecific, but compatible with chronic small vessel ischemic disease. Unchanged 5 mm rounded extra-axial calcified focus overlying the anterior left frontal lobe, suspected small incidental meningioma. There is no acute infarct. No extra-axial fluid collection. Vascular: Reported below. Skull and upper cervical spine: No focal marrow lesion. Cervical spondylosis. Ligamentous hypertrophy/pannus formation posterior to the dens. Sinuses/Orbits: Prior lens replacements. Trace ethmoid sinus mucosal thickening. Other: Left parietooccipital scalp soft tissue swelling. MRA HEAD FINDINGS The intracranial internal carotid arteries are patent. Moderate/severe atherosclerotic narrowing of the cavernous segments bilaterally, progressed as compared to the MRA of 09/26/2009. No M2 proximal branch occlusion. Atherosclerotic irregularity of the M2 and more distal MCA branch vessels bilaterally. Most notably, there is a moderate stenosis within a proximal M2 left MCA branch (series 1051, image 4). The anterior cerebral arteries are patent. The intracranial vertebral arteries are  patent. The basilar artery is patent. The posterior cerebral arteries are patent. Mild atherosclerotic narrowing of the proximal left PCA (series 1039, image 15). Mild/moderate narrowing of the left PCA at the P3/P4 junction. Posterior communicating arteries are present bilaterally. These results were called by telephone at the time of interpretation on 02/08/2020 at 7:48 am to provider Dr. Roslynn Amble, who verbally acknowledged these results. IMPRESSION: MRI brain: 1. Thin acute subdural hematoma overlying the anterolateral right frontal lobe, measuring up to 4 mm in thickness. 2. Moderate chronic small vessel ischemic disease, stable as compared to the brain MRI of 11/06/2019. 3. 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 4. Left parietooccipital scalp soft tissue swelling. MRA head: 1. No intracranial large vessel occlusion. 2. Intracranial atherosclerotic disease most notably as follows. 3. 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 4. Moderate focal stenosis within a proximal M2 left MCA branch vessel. 5. Mild/moderate stenosis of the left PCA at the P3/P4 junction. Electronically Signed   By: Kellie Simmering DO   On: 02/08/2020 07:49   Pelvis Portable  Result Date: 02/10/2020 CLINICAL DATA:  LEFT hip arthroplasty EXAM: PORTABLE PELVIS 1-2 VIEWS COMPARISON:  February 08, 2020 FINDINGS: Status post LEFT hip arthroplasty. Orthopedic hardware is intact and without periprosthetic fracture or lucency. Osteopenia. No additional acute fracture or dislocation noted on limited views. LEFT-sided subcutaneous air. Foley catheter. Surgical clips project over the LEFT groin. IMPRESSION: Expected postsurgical changes status post LEFT hip arthroplasty. Electronically Signed   By: Meda Klinefelter MD   On: 02/10/2020 18:50   DG Pelvis Portable  Result Date: 02/08/2020 CLINICAL DATA:  Pelvic  trauma EXAM: PORTABLE PELVIS 1-2 VIEWS COMPARISON:  None. FINDINGS: Single view radiograph the pelvis demonstrates an acute, left subcapital femoral neck fracture with superior displacement, varus angulation, and external rotation of the distal fracture fragment. The femoral head is still seated within the left acetabulum. Mild bilateral hip joint space narrowing is present in keeping with mild bilateral degenerative arthritis. Pelvis and sacrum appear intact. Limited evaluation of the right hip is unremarkable. IMPRESSION: Acute, displaced, angulated subcapital left femoral neck fracture. Electronically Signed   By: Helyn Numbers MD   On: 02/08/2020 02:44   DG CHEST PORT 1 VIEW  Result Date: 02/18/2020 CLINICAL DATA:  Central line placement EXAM: PORTABLE CHEST 1 VIEW COMPARISON:  Chest x-[facility] dated 02/17/2020 FINDINGS: There is a newly placed right-sided central venous catheter with the tip terminating over the SVC. There is no right-sided pneumothorax. The heart size is similar to prior study. Aortic calcifications are noted. There are developing bibasilar airspace opacities which are favored to represent atelectasis. There is elevation of the right hemidiaphragm. There are degenerative changes of both shoulders. There is no acute displaced fracture. IMPRESSION: 1. Newly placed right-sided central venous catheter as above with no evidence for pneumothorax. 2. Developing bibasilar atelectasis. Electronically Signed   By: Katherine Mantle M.D.   On: 02/18/2020 19:01   DG Chest Port 1 View  Result Date: 02/17/2020 CLINICAL DATA:  Respiratory distress.  Hypertension. EXAM: PORTABLE CHEST 1 VIEW COMPARISON:  None. FINDINGS: Stable cardiomegaly. The hila and mediastinum are unchanged. The lungs are clear. No other acute abnormalities. IMPRESSION: Stable cardiomegaly. No focal infiltrates or pulmonary abnormalities identified. Electronically Signed   By: Gerome Sam III M.D   On: 02/17/2020 12:26   DG  Chest Port 1 View  Result Date: 02/08/2020 CLINICAL DATA:  Chest trauma EXAM: PORTABLE CHEST 1 VIEW COMPARISON:  10/04/2018 FINDINGS: The heart size and mediastinal contours are within normal limits. Both lungs are clear. The visualized skeletal structures are unremarkable. IMPRESSION: No active disease. Electronically Signed   By: Helyn Numbers MD   On: 02/08/2020 02:42   ECHOCARDIOGRAM COMPLETE  Result Date: 02/08/2020    ECHOCARDIOGRAM REPORT   Patient Name:   [facility] GARNER Heying Date of Exam: 02/08/2020 Medical Rec #:  [id number]        Height:       66.0 in Accession #:    [id number]       Weight:       189.0 lb Date of Birth:  10-Jul-1940        BSA:          1.952 m Patient Age:    79 years         BP:           107/59 mmHg Patient Gender: F                HR:           76 bpm. Exam Location:  Inpatient  Procedure: 2D Echo, Cardiac Doppler and Color Doppler Indications:    Stroke 434.91 / I163.9  History:        Patient has no prior history of Echocardiogram examinations.                 CAD; Risk Factors:Diabetes.  Sonographer:    Bernadene Person RDCS Referring Phys: [id number] ERIC J [place] IMPRESSIONS  1. Left ventricular ejection fraction, by estimation, is 60 to 65%. The left ventricle has normal function. The left ventricle has no regional wall motion abnormalities. There is mild left ventricular hypertrophy. Left ventricular diastolic parameters are consistent with Grade I diastolic dysfunction (impaired relaxation).  2. Right ventricular systolic function is normal. The right ventricular size is normal. There is normal pulmonary artery systolic pressure. The estimated right ventricular systolic pressure is 02.5 mmHg.  3. The mitral valve is normal in structure. Trivial mitral valve regurgitation. No evidence of mitral stenosis.  4. The aortic valve is tricuspid. Aortic valve regurgitation is not visualized. No aortic stenosis is present.  5. The inferior vena cava is normal in size with <50%  respiratory variability, suggesting right atrial pressure of 8 mmHg. FINDINGS  Left Ventricle: Left ventricular ejection fraction, by estimation, is 60 to 65%. The left ventricle has normal function. The left ventricle has no regional wall motion abnormalities. The left ventricular internal cavity size was normal in size. There is  mild left ventricular hypertrophy. Left ventricular diastolic parameters are consistent with Grade I diastolic dysfunction (impaired relaxation). Right Ventricle: The right ventricular size is normal. No increase in right ventricular wall thickness. Right ventricular systolic function is normal. There is normal pulmonary artery systolic pressure. The tricuspid regurgitant velocity is 2.18 m/s, and  with an assumed right atrial pressure of 8 mmHg, the estimated right ventricular systolic pressure is 42.7 mmHg. Left Atrium: Left atrial size was normal in size. Right Atrium: Right atrial size was normal in size. Pericardium: Trivial pericardial effusion is present. Mitral Valve: The mitral valve is normal in structure. Trivial mitral valve regurgitation. No evidence of mitral valve stenosis. Tricuspid Valve: The tricuspid valve is normal in structure. Tricuspid valve regurgitation is trivial. Aortic Valve: The aortic valve is tricuspid. Aortic valve regurgitation is not visualized. No aortic stenosis is present. Pulmonic Valve: The pulmonic valve was normal in structure. Pulmonic valve regurgitation is not visualized. Aorta: The aortic root is normal in size and structure. Venous: The inferior vena cava is normal in size with less than 50% respiratory variability, suggesting right atrial pressure of 8 mmHg. IAS/Shunts: No atrial level shunt detected by color flow Doppler.  LEFT VENTRICLE PLAX 2D LVIDd:         3.90 cm  Diastology LVIDs:         2.70 cm  LV e' medial:    6.48 cm/s LV PW:         0.80 cm  LV E/e' medial:  13.3 LV IVS:        1.40 cm  LV e' lateral:   7.80 cm/s LVOT diam:      1.80 cm  LV E/e' lateral: 11.1 LV SV:         75 LV SV Index:   39 LVOT Area:     2.54 cm  RIGHT VENTRICLE RV S prime:     11.10 cm/s TAPSE (M-mode): 1.7 cm LEFT ATRIUM             Index       RIGHT ATRIUM  Index LA diam:        3.00 cm 1.54 cm/m  RA Area:     16.10 cm LA Vol (A2C):   47.1 ml 24.13 ml/m RA Volume:   41.60 ml  21.31 ml/m LA Vol (A4C):   39.2 ml 20.08 ml/m LA Biplane Vol: 42.7 ml 21.87 ml/m  AORTIC VALVE LVOT Vmax:   146.00 cm/s LVOT Vmean:  102.000 cm/s LVOT VTI:    0.296 m  AORTA Ao Root diam: 3.30 cm Ao Asc diam:  3.40 cm MITRAL VALVE                TRICUSPID VALVE MV Area (PHT): 2.69 cm     TR Peak grad:   19.0 mmHg MV Decel Time: 282 msec     TR Vmax:        218.00 cm/s MV E velocity: 86.50 cm/s MV A velocity: 102.00 cm/s  SHUNTS MV E/A ratio:  0.85         Systemic VTI:  0.30 m                             Systemic Diam: 1.80 cm Marca Ancona MD Electronically signed by Marca Ancona MD Signature Date/Time: 02/08/2020/3:32:08 PM    Final    [place] EKG SITE RITE  Result Date: 02/17/2020 If Site Rite image not attached, placement could not be confirmed due to current cardiac rhythm.  CT Angio Abd/Pel w/ and/or w/o  Addendum Date: 02/24/2020   ADDENDUM REPORT: 02/24/2020 04:59 ADDENDUM: Critical Value/emergent results were called by telephone at the time of interpretation on 02/24/2020 at 4:56 am to nurse [facility] provider [place] who verbally acknowledged these results. Electronically Signed   By: Burman Nieves M.D.   On: 02/24/2020 04:59   Result Date: 02/24/2020 CLINICAL DATA:  [place] EXAM: CTA ABDOMEN AND PELVIS WITHOUT AND WITH CONTRAST TECHNIQUE: Multidetector CT imaging of the abdomen and pelvis was performed using the standard protocol during bolus administration of intravenous contrast. Multiplanar reconstructed images and MIPs were obtained and reviewed to evaluate the vascular anatomy. CONTRAST:  30mL OMNIPAQUE IOHEXOL 350 MG/ML SOLN COMPARISON:  02/17/2020  FINDINGS: VASCULAR Aorta: Normal caliber aorta without aneurysm, dissection, vasculitis or significant stenosis. Aortic calcifications. Celiac: Patent without evidence of aneurysm, dissection, vasculitis or significant stenosis. SMA: Patent without evidence of aneurysm, dissection, vasculitis or significant stenosis. Renals: Both renal arteries are patent without evidence of aneurysm, dissection, vasculitis, fibromuscular dysplasia or significant stenosis. IMA: Patent without evidence of aneurysm, dissection, vasculitis or significant stenosis. Inflow: Patent without evidence of aneurysm, dissection, vasculitis or significant stenosis. Proximal Outflow: Bilateral common femoral and visualized portions of the superficial and profunda femoral arteries are patent without evidence of aneurysm, dissection, vasculitis or significant stenosis. Veins: No obvious venous abnormality within the limitations of this arterial phase study. Review of the MIP images confirms the above findings. NON-VASCULAR Lower chest: Small bilateral pleural effusions with basilar atelectasis. Hepatobiliary: No focal liver abnormality is seen. Status post cholecystectomy. No biliary dilatation. Pancreas: Unremarkable. No pancreatic ductal dilatation or surrounding inflammatory changes. Spleen: Normal in size without focal abnormality. Adrenals/Urinary Tract: 3 cm diameter left adrenal gland nodule is unchanged since prior studies. Kidneys are symmetrical with homogeneous nephrograms. No hydronephrosis or hydroureter. Bladder wall is mildly thickened with small amount of bladder gas. This could be due to instrumentation or cystitis. Stomach/Bowel: Stomach, small bowel, and colon are not abnormally distended. No areas of contrast extravasation are identified  to suggest a site of active bleeding. Appendix is normal. Lymphatic: No significant lymphadenopathy. Reproductive: Status post hysterectomy. No adnexal masses. Other: No free air or free fluid  in the abdomen. Abdominal wall musculature appears intact. Prominent soft tissue edema in the subcutaneous fat of the abdomen and pelvis. Incidental note of pulmonary emboli in the left lower lung, partially seen only on the superior most images. Musculoskeletal: Degenerative changes in the spine. No destructive bone lesions. Left hip arthroplasty. IMPRESSION: 1. No evidence of focal contrast extravasation to suggest a site of active bleeding. 2. Aortic atherosclerosis. No significant arterial stenosis. 3. Incidental note of pulmonary emboli in the left lower lung, partially seen only on the superior most images. 4. Small bilateral pleural effusions with basilar atelectasis. 5. Bladder wall is mildly thickened with small amount of bladder gas. This could be due to instrumentation or cystitis. 6. Prominent soft tissue edema in the subcutaneous fat of the abdomen and pelvis. 7. 3 cm diameter left adrenal gland nodule is unchanged since prior studies. Aortic Atherosclerosis (ICD10-I70.0). Electronically Signed: By: Lucienne Capers M.D. On: 02/24/2020 04:38    Labs:  CBC: Recent Labs    02/23/20 1318 02/23/20 1944 02/24/20 0506 02/24/20 1305  WBC 21.3* 18.2* 19.8* 19.1*  HGB 7.7* 6.8* 7.8* 7.4*  HCT 23.7* 21.2* 24.2* 22.5*  PLT 244 235 239 263    COAGS: Recent Labs    02/18/20 1820 02/19/20 0322 02/19/20 1410 02/19/20 2042  INR 3.8* 2.4* 1.5* 1.4*    BMP: Recent Labs    12/05/19 1003 02/08/20 0235 02/21/20 0205 02/22/20 0523 02/23/20 0332 02/24/20 0506  NA 138   < > 139 141 139 140  K 4.7   < > 3.4* 3.6 4.0 3.8  CL 100   < > 107 106 106 107  CO2 30   < > $R'23 24 25 25  'mN$ GLUCOSE 125*   < > 117* 115* 131* 117*  BUN 13   < > 55* 35* 33* 39*  CALCIUM 10.0   < > 8.0* 8.2* 7.3* 7.6*  CREATININE 1.61*   < > 1.61* 1.42* 1.36* 1.36*  GFRNONAA 30*   < > 32* 37* 39* 39*  GFRAA 34*  --   --   --   --   --    < > = values in this interval not displayed.    LIVER FUNCTION TESTS: Recent  Labs    12/05/19 1003 02/08/20 0235 02/16/20 0111  BILITOT 0.6 0.5 0.6  AST 39 44* 30  ALT $Re'26 29 22  'KxU$ ALKPHOS 110 104 76  PROT 7.2 7.2 5.3*  ALBUMIN 3.6 3.8 2.2*    TUMOR MARKERS: No results for input(s): AFPTM, CEA, CA199, CHROMGRNA in the last 8760 hours.  Assessment and Plan: GI Bleed Patient with suspected upper GI bleed of unknown source.  Most compelling evidence thus far is a large blood vessel identified during EGD felt to be in the distribution of the [place].  IR consulted for empiric GDA embolization.  Despite EGD x2, no bleeding vessel has been identified.  Patient continues with ongoing anemia requiring multiple units of blood (12u PRBC, 5FFP).  INR was normal as of 11/23, 1.4 She and daughter reports slowing of melena today, although still having daily dark, liquid stool output.  Hgb 7.4. Unfortunately, her renal failure is of concern.  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  Discussed with Dr. Serafina Royals. Given her kidney disease with improved symptoms today no plans  for empiric embolization.  The likelihood of identifying a bleeding vessel on angiogram with slowing melena is felt to be low.  IR remains available in the event patient fails to continue her current trajectory or additional studies such as a NM Tagged RBC study shows bleeding location.  Please see attestation from Dr. Serafina Royals for additional thoughts.   Thank you for this interesting consult.  I greatly enjoyed meeting Melysa Schroyer and look forward to participating in their care.  A copy of this report was sent to the requesting provider on this date.  Electronically Signed: Docia Barrier, PA 02/24/2020, 1:42 PM   I spent a total of 40 Minutes    in face to face in clinical consultation, greater than 50% of which was counseling/coordinating care for GI bleed.

## 2020-02-25 DIAGNOSIS — I2699 Other pulmonary embolism without acute cor pulmonale: Secondary | ICD-10-CM

## 2020-02-25 LAB — BASIC METABOLIC PANEL
Anion gap: 9 (ref 5–15)
BUN: 39 mg/dL — ABNORMAL HIGH (ref 8–23)
CO2: 25 mmol/L (ref 22–32)
Calcium: 7.7 mg/dL — ABNORMAL LOW (ref 8.9–10.3)
Chloride: 106 mmol/L (ref 98–111)
Creatinine, Ser: 1.32 mg/dL — ABNORMAL HIGH (ref 0.44–1.00)
GFR, Estimated: 41 mL/min — ABNORMAL LOW (ref >60)
Glucose, Bld: 128 mg/dL — ABNORMAL HIGH (ref 70–99)
Potassium: 3.8 mmol/L (ref 3.5–5.1)
Sodium: 140 mmol/L (ref 135–145)

## 2020-02-25 LAB — CBC
HCT: 22.3 % — ABNORMAL LOW (ref 36.0–46.0)
HCT: 21.8 % — ABNORMAL LOW (ref 36.0–46.0)
HCT: 22.1 % — ABNORMAL LOW (ref 36.0–46.0)
Hemoglobin: 7 g/dL — ABNORMAL LOW (ref 12.0–15.0)
Hemoglobin: 7 g/dL — ABNORMAL LOW (ref 12.0–15.0)
Hemoglobin: 7.1 g/dL — ABNORMAL LOW (ref 12.0–15.0)
MCH: 28.5 pg (ref 26.0–34.0)
MCH: 28.3 pg (ref 26.0–34.0)
MCH: 28.3 pg (ref 26.0–34.0)
MCHC: 31.4 g/dL (ref 30.0–36.0)
MCHC: 32.1 g/dL (ref 30.0–36.0)
MCHC: 32.1 g/dL (ref 30.0–36.0)
MCV: 90.7 fL (ref 80.0–100.0)
MCV: 88.3 fL (ref 80.0–100.0)
MCV: 88 fL (ref 80.0–100.0)
Platelets: 275 10*3/uL (ref 150–400)
Platelets: 274 10*3/uL (ref 150–400)
Platelets: 252 10*3/uL (ref 150–400)
RBC: 2.46 MIL/uL — ABNORMAL LOW (ref 3.87–5.11)
RBC: 2.47 MIL/uL — ABNORMAL LOW (ref 3.87–5.11)
RBC: 2.51 MIL/uL — ABNORMAL LOW (ref 3.87–5.11)
RDW: 16.2 % — ABNORMAL HIGH (ref 11.5–15.5)
RDW: 17.4 % — ABNORMAL HIGH (ref 11.5–15.5)
RDW: 16.8 % — ABNORMAL HIGH (ref 11.5–15.5)
WBC: 16.9 10*3/uL — ABNORMAL HIGH (ref 4.0–10.5)
WBC: 16.1 10*3/uL — ABNORMAL HIGH (ref 4.0–10.5)
WBC: 13.3 10*3/uL — ABNORMAL HIGH (ref 4.0–10.5)
nRBC: 0 % (ref 0.0–0.2)
nRBC: 0 % (ref 0.0–0.2)
nRBC: 0 % (ref 0.0–0.2)

## 2020-02-25 LAB — GLUCOSE, CAPILLARY
Glucose-Capillary: 131 mg/dL — ABNORMAL HIGH (ref 70–99)
Glucose-Capillary: 138 mg/dL — ABNORMAL HIGH (ref 70–99)
Glucose-Capillary: 116 mg/dL — ABNORMAL HIGH (ref 70–99)
Glucose-Capillary: 151 mg/dL — ABNORMAL HIGH (ref 70–99)

## 2020-02-25 LAB — MAGNESIUM: Magnesium: 1.8 mg/dL (ref 1.7–2.4)

## 2020-02-25 LAB — PREPARE RBC (CROSSMATCH)

## 2020-02-25 MED ORDER — DIPHENHYDRAMINE HCL 25 MG PO CAPS
25.0000 mg | ORAL_CAPSULE | Freq: Once | ORAL | Status: AC
  Administered 2020-02-25: 25 mg via ORAL
  Filled 2020-02-25: qty 1

## 2020-02-25 MED ORDER — SODIUM CHLORIDE 0.9 % IV SOLN: INTRAVENOUS | Status: DC

## 2020-02-25 MED ORDER — SODIUM CHLORIDE 0.9% IV SOLUTION: Freq: Once | INTRAVENOUS | Status: AC

## 2020-02-25 MED ORDER — SENNOSIDES-DOCUSATE SODIUM 8.6-50 MG PO TABS
2.0000 | ORAL_TABLET | Freq: Two times a day (BID) | ORAL | Status: DC | PRN
  Administered 2020-02-29: 2 via ORAL
  Filled 2020-02-25: qty 2

## 2020-02-25 MED ORDER — SODIUM CHLORIDE 0.9% IV SOLUTION: Freq: Once | INTRAVENOUS | Status: DC

## 2020-02-25 MED ORDER — SODIUM CHLORIDE 0.9 % IV SOLN
8.0000 mg/h | INTRAVENOUS | Status: DC
  Administered 2020-02-25 – 2020-02-27 (×5): 8 mg/h via INTRAVENOUS
  Filled 2020-02-25 (×7): qty 80

## 2020-02-25 MED ORDER — ACETAMINOPHEN 325 MG PO TABS: 650.0000 mg | ORAL_TABLET | Freq: Once | ORAL | Status: DC

## 2020-02-25 NOTE — Consult Note (Signed)
   [facility] CM Inpatient Consult   02/25/2020  [name] 01/10/1941 [id number]   [place] [facility] [[facility]] Patient:  Medicare NextGen  LOS: 8 days   Patient has been active with a [facility]. Patient is noted with extreme high risk scores for unplanned readmission with less than 7 days and for 3 hospitalizations in the past 6 months.    Plan:  Following with inpatient TOC team in progression meeting and make aware of [facility] for follow up.  Continue to follow progress and disposition to assess for post hospital care management needs.    For questions contact:   Natividad Brood, RN BSN [facility] Liaison  [phone] business mobile phone Toll free office [phone]  Fax number: [phone] [email or web address]

## 2020-02-25 NOTE — Progress Notes (Signed)
Mobility Specialist: Progress Note   02/25/20 1222  Mobility  Activity  (Cancel)   Pt states she needs to be cleaned up and her lunch had just arrived and asked if I would come back. NT notified pt needs pericare. Will f/u as time allows.   [facility] Terrisa Curfman Mobility Specialist

## 2020-02-25 NOTE — Progress Notes (Signed)
    Subjective: Left hip hemiarthroplasty 02-10-20 Pt. Being treated for unidentified GI bleed. Patient reports pain as mild.  ]  Objective:   VITALS:   Vitals:   02/15/20 0308 02/15/20 0800 02/15/20 0941 02/15/20 1200  BP: 132/83 (!) 103/93 (!) 103/93 137/88  Pulse: (!) 101 98  97  Resp: 20 19  18   Temp: 98.7 F (37.1 C) 98.2 F (36.8 C)  98.2 F (36.8 C)  TempSrc: Oral Axillary  Oral  SpO2: 96% 98%  98%  Weight:      Height:       CBC Latest Ref Rng & Units 02/25/2020 02/25/2020 02/24/2020  WBC 4.0 - 10.5 K/uL 16.1(H) 16.9(H) 16.9(H)  Hemoglobin 12.0 - 15.0 g/dL 7.0(L) 7.0(L) 7.0(L)  Hematocrit 36 - 46 % 21.8(L) 22.3(L) 22.1(L)  Platelets 150 - 400 K/uL 274 275 271   BMP Latest Ref Rng & Units 02/25/2020 02/24/2020 02/23/2020  Glucose 70 - 99 mg/dL 128(H) 117(H) 131(H)  BUN 8 - 23 mg/dL 39(H) 39(H) 33(H)  Creatinine 0.44 - 1.00 mg/dL 1.32(H) 1.36(H) 1.36(H)  BUN/Creat Ratio 12 - 28 - - -  Sodium 135 - 145 mmol/L 140 140 139  Potassium 3.5 - 5.1 mmol/L 3.8 3.8 4.0  Chloride 98 - 111 mmol/L 106 107 106  CO2 22 - 32 mmol/L 25 25 25   Calcium 8.9 - 10.3 mg/dL 7.7(L) 7.6(L) 7.3(L)   Intake/Output    None      Physical Exam: General: NAD.   Resp: No increased wob Cardio: regular rate and rhythm ABD soft Neurologically intact MSK Neurovascularly intact Sensation intact distally Intact pulses distally Dorsiflexion/Plantar flexion intact Incision: c/d/i  Assessment: 15 Days Post-Op  S/P Procedure(s) (LRB): ARTHROPLASTY BIPOLAR HIP (HEMIARTHROPLASTY) POSTERIOR LATERAL (Left) by Dr. Ernesta Amble. Percell Miller on 02/10/20  Principal Problem:   Hip fracture, left, closed, initial encounter [facility]) Active Problems:   CAD (coronary artery disease)   CKD (chronic kidney disease) stage 4, GFR 15-29 ml/min (HCC)   Chronic diastolic CHF (congestive heart failure) (HCC)   Type 2 diabetes mellitus with vascular disease (HCC)   Hip fracture (HCC)   Closed fracture of neck of  left femur (HCC)  ADDITIONAL DIAGNOSIS:     Primary osteoarthritis, status post total hip arthroplasty  Plan: Up with therapy Incentive Spirometry Apply ice   Weight Bearing: WBAT to LLE Dressings: Dressing change as needed to protect incision. VTE prophylaxis: Per primary team SCDs, ambulation Dispo: per primary team F/u with [name] after d/c to set up physical therapy.   Rachael Fee, PA-C 02/25/2020, 3:33 PM

## 2020-02-25 NOTE — Consult Note (Signed)
[facility] Gastroenterology Progress Note  [name] 79 y.o. February 13, 1941  CC: Melena, anemia  Subjective: Two reddish/black bowel liquid movements this morning.  She denies any abdominal pain, nausea, or vomiting.  Is tolerating a diet.  ROS : Review of Systems  Cardiovascular: Negative for chest pain and palpitations.  Gastrointestinal: Positive for diarrhea and melena. Negative for abdominal pain, blood in stool, constipation, heartburn, nausea and vomiting.   Objective: Vital signs in last 24 hours: Vitals:   02/25/20 0727 02/25/20 0858  BP:    Pulse:    Resp:    Temp: 98.2 F (36.8 C) 98 F (36.7 C)  SpO2:      Physical Exam:  General:  Lethargic, oriented, cooperative, no distress, elderly  Head:  Normocephalic, without obvious abnormality, atraumatic  Eyes:  Conjunctival pallor, EOMs intact  Lungs:   Clear to auscultation bilaterally, respirations unlabored  Heart:  Mild tachycardia with regular rhythm  Abdomen:   Soft, non-tender, non-distended, bowel sounds active all four quadrants,  no guarding or peritoneal signs  Extremities: Extremities normal, atraumatic, no  edema  Pulses: 2+ and symmetric    Lab Results: Recent Labs    02/23/20 0332 02/23/20 0332 02/24/20 0506 02/25/20 0700  NA 139   < > 140 140  K 4.0   < > 3.8 3.8  CL 106   < > 107 106  CO2 25   < > 25 25  GLUCOSE 131*   < > 117* 128*  BUN 33*   < > 39* 39*  CREATININE 1.36*   < > 1.36* 1.32*  CALCIUM 7.3*   < > 7.6* 7.7*  MG 2.0   < > 2.1 1.8  PHOS 2.9  --   --   --    < > = values in this interval not displayed.   No results for input(s): AST, ALT, ALKPHOS, BILITOT, PROT, ALBUMIN in the last 72 hours. Recent Labs    02/25/20 0100 02/25/20 0700  WBC 16.9* 16.1*  HGB 7.0* 7.0*  HCT 22.3* 21.8*  MCV 90.7 88.3  PLT 275 274   No results for input(s): LABPROT, INR in the last 72 hours.  Assessment: Melena, anemia: -Continue drop in hemoglobin with continued need for transfusion.  Patient received 2u pRBCs 11/28 and 1u pRBCs thus far today.  Inadequate rise in hemoglobin after transfusion.  Hemoglobin 7 today -EGD 11/27 revealed nonbleeding duodenal ulcer with visible vessel, nonbleeding gastric ulcers with no stigmata of bleeding, and esophagitis.  Visible ulcer not treated at time of EGD due to clinical status at time of EGD. -CT-A yesterday with no evidence of active bleeding  Plan: EGD tomorrow.  Unfortunately, patient had a regular breakfast this morning, thus we would not proceed with EGD today unless emergent need arises.  Currently, patient is stable and normotensive.  I thoroughly discussed the procedure with the patient to include nature, alternatives (medical management/PPI), benefits (possible treatment of bleeding), and risks (including but not limited to bleeding, infection, perforation, anesthesia/cardiac and pulmonary complications).  Patient verbalized understanding and gave verbal consent to proceed with EGD.  Protonix drip.  Clear liquid diet, NPO after midnight.  Continue to monitor H&H with transfusion as needed to maintain Hgb >7.  [facility] will follow.  Salley Slaughter PA-C 02/25/2020, 11:15 AM  Contact #  [phone]

## 2020-02-25 NOTE — Progress Notes (Signed)
Inpatient Rehabilitation-Admissions Coordinator   Recommendations are now for SNF as pt appears to have limited tolerance at this time. AC will no longer follow for return to CIR. If rec's were to change back to CIR, please contact this AC.   Raechel Ache, OTR/L  Rehab Admissions Coordinator  [phone] 02/25/2020 1:15 PM

## 2020-02-25 NOTE — Progress Notes (Signed)
Physical Therapy Treatment Patient Details Name: [name] MRN: [id number] DOB: 16-Jun-1940 Today's Date: 02/25/2020    History of Present Illness 79 y.o. female admitted back to the hospital from inpatient rehab on 02/17/20 secondary to acute blood loss anemia and hemodynamic instability.  Dx with hemorrhagic shock secondary to UGIB (esophagitis likely cause), hypoxemia related to atelectasis, mild AKI.  Pt with recent PMH of subacute CVA/ small subdural hematoma, L hip fx s/p repair on 02/10/20 (WBAT with posterior precautions), peripheral neuropathy, gout, HTN, dementia/mild cognitive impairment, DM, CAD, CKD, Charcot's Bil feet, BreastCA bil s/p bil lumpectomy, R ankle fusion 01/2018.     PT Comments    Pt remains motivated to participate. Session focused on bed level exercises for BLE strengthening and bed mobility. Pt requiring two person moderate assist for supine <> sit. Further transfer deferred by pt due to dizziness and fatigue. BP 118/55. Continue to recommend SNF for ongoing Physical Therapy.       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 Restrictions Weight Bearing Restrictions: No    Mobility  Bed Mobility Overal bed mobility: Needs Assistance Bed Mobility: Supine to Sit;Sit to Supine     Supine to sit: Mod assist;+2 for physical assistance Sit to supine: Mod assist;+2 for physical assistance   General bed mobility comments: Pt initiating BLE movement to edge  of bed, cues for reaching for bed rail, assist for trunk to upright and use of bed pad to scoot hips forward. BLE assist provided for return to supine position  Transfers                 General transfer comment: deferred by pt due to dizziness/fatigue  Ambulation/Gait                 Stairs             Wheelchair Mobility    Modified Rankin (Stroke Patients Only)       Balance Overall balance assessment: Needs assistance Sitting-balance support: Feet unsupported Sitting balance-Leahy Scale: Fair Sitting balance - Comments: Supervision for safety                                    Cognition Arousal/Alertness: Awake/alert Behavior During Therapy: WFL for tasks assessed/performed Overall Cognitive Status: History of cognitive impairments - at baseline Area of Impairment: Memory                     Memory: Decreased short-term memory Following Commands: Follows one step commands consistently       General Comments: Pt has listed h/o dementia in chart.       Exercises General Exercises - Lower Extremity Ankle Circles/Pumps: AROM;Left;10 reps Quad Sets: Left;10 reps;Supine Long Arc Quad: Both;10 reps;Seated Hip ABduction/ADduction: AROM;10 reps;Left;Supine Straight Leg Raises: Right;10 reps;Supine    General Comments        Pertinent Vitals/Pain Pain Assessment: Faces Faces Pain Scale: Hurts even more Pain Location: L hip Pain Descriptors /  Indicators: Grimacing;Guarding Pain Intervention(s): Limited activity within patient's tolerance;Monitored during session    Home Living                      Prior Function            PT Goals (current goals can now be found in the care plan section) Acute Rehab PT Goals Patient Stated Goal: to get stronger Potential to Achieve Goals: Fair Progress towards PT goals: Progressing toward goals    Frequency    Min 3X/week      PT Plan Current plan remains  appropriate    Co-evaluation              AM-PAC PT "6 Clicks" Mobility   Outcome Measure  Help needed turning from your back to your side while in a flat bed without using bedrails?: A Lot Help needed moving from lying on your back to sitting on the side of a flat bed without using bedrails?: A Lot Help needed moving to and from a bed to a chair (including a wheelchair)?: Total Help needed standing up from a chair using your arms (e.g., wheelchair or bedside chair)?: Total Help needed to walk in hospital room?: Total Help needed climbing 3-5 steps with a railing? : Total 6 Click Score: 8    End of Session   Activity Tolerance: Patient limited by fatigue Patient left: in bed;with call bell/phone within reach;with family/visitor present Nurse Communication: Mobility status PT Visit Diagnosis: Muscle weakness (generalized) (M62.81);Pain;Other abnormalities of gait and mobility (R26.89);Unsteadiness on feet (R26.81);Difficulty in walking, not elsewhere classified (R26.2) Pain - Right/Left: Left Pain - part of body: Hip     Time: 1660-6301 PT Time Calculation (min) (ACUTE ONLY): 29 min  Charges:  $Therapeutic Exercise: 8-22 mins $Therapeutic Activity: 8-22 mins                     Wyona Almas, PT, DPT Acute Rehabilitation Services Pager [phone] Office [phone]    Deno Etienne 02/25/2020, 12:44 PM

## 2020-02-25 NOTE — Progress Notes (Signed)
PROGRESS NOTE    [name]   VEL:[id number]  DOB: 07-Apr-1940  DOA: 02/17/2020     8  PCP: [name], DO  CC: GIB  Hospital Course: [name] is an 79 year old female notable PMH for CAD s/p stent, CKD stage IV, DMT2, HFpEF, and breast cancer.   Previously admitted 11/12 to 11/19 for left hip fracture s/p hemiarthroplasty 11/14 and incidentally found to have subacute CVA and small SDH.  MRI brain done and revealed thin acute right frontal lobe SDH with moderate stable small vessel disease and suspected 5 mm incidental meningioma. 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  No role for surgical intervention per Neurosurgery.  She was managed on ASA/ plavix for stroke prevention.   Hospital course complicated by ABLA, hypoxia due to volume overload, and urinary retention.  Discharged to CIR on 11/19, however developed nausea, abdominal/ epigastric pain with hypotension with Hgb drop from 8.2-> 5.4.  Vomited x 1 with questionable aspiration and re-admitted to hospital on 11/21.   On 11/20, she complained of epigastric and abdominal discomfort without SOB with ongoing constipation.  KUB showed non-obstructive gas pattered with stool present in rectum.  UA negative.  Has been afebrile.  Overnight, she developed hypotension with ongoing abdominal and epigastric pain, nausea, SOB, and with one episode of reported emesis, unclear if bloody.  Post emesis, staff report she had a wet cough with audible rales, requiring 3L [place].  Hgb 5.4, down from 8.2 on 11/20.  She was accepted by [facility] and on arrival, with ongoing SBP in the 70's.  2 units PRBC, CXR, and CT abd/ pelvis ordered.   PCCM consulted for ICU transfer given hemodynamic instability.   Her hemoglobin stabilized as well as vitals.  She was able to be  weaned off of oxygen to room air.  She was transferred out of the ICU on 02/20/2020.  She had been recovering fairly well until the evening of 02/22/2020 when she developed melanotic stools and a significant drop in her hemoglobin from 8 g/dL down to 6.1 g/dL.  She also became hypotensive and tachycardic.  She was started on IV fluids and transfused 3 units PRBC.  GI was reconsulted with plans for repeat EGD on 02/23/2020. EGD found cratered nonbleeding gastric ulcers and 2 large nonbleeding cratered duodenal ulcers, with a large friable vessel in the duodenal bulb.  Intervention on the vessel was not performed as was considered high risk for the patient.  Findings were discussed with patient and her daughter who ultimately elected to have IR evaluation for possible embolization.    Interval History:  2 more units of blood overnight and this am I am ordering another unit due to Hgb still 7g/dL. She is weak and lethargic but no overt distress. She's back on O2 but breathing comfortably.  Understands tentative plan is for repeat EGD tomorrow.  Old records reviewed in assessment of this patient  ROS: Constitutional: positive for fatigue and malaise, Respiratory: negative, Cardiovascular: negative for chest pain and Gastrointestinal: negative for abdominal pain  Assessment & Plan: * GIB (gastrointestinal bleeding) -Suspected due to upper GI bleed from esophagitis (Grade D on EGD 11/23) contributing - s/p PRBC 15 units so far for admission   -Continue trending H/H -Aspirin was resumed on 02/21/2020.  Plavix has been discontinued. Asa back on  hold on 11/27 after Hgb drop overnight and black tarry stools; Hgb has not recovered since -GI reconsulted on 02/23/2020 for undergoing repeat EGD to reevaluate for hemoglobin drop - repeat EGD on 11/27 found cratered nonbleeding gastric ulcers and 2 large nonbleeding cratered duodenal ulcers, with a large friable vessel in the duodenal bulb.  Intervention on  the vessel was not performed as was considered high risk for the patient.  Findings were discussed with patient and her daughter who ultimately elected to have IR evaluation for possible embolization. IR evaluated and stated no utility for embolization due to not actively bleeding/not seen (recommended tagged red cell scan if active bleed) vs repeat EGD - again discussed with GI on 11/29; unfortunately she ate b'fast but repeat EGD planned for 11/30 unless emergently needed - CLD today and NPO at [facility] - continue trending H/H  S/p left hip fracture - s/p repair on 11/14 - continue PT/OT -Now too deconditioned for CIR.  Likely needs SNF  Acute respiratory failure with hypoxia ([place]) -Considered due to atelectasis -Working well with spirometry -Oxygen has been weaned off on 11/25 but placed back on after starting to require multiple transfusions  Pulmonary emboli ([place]) -Incidental finding on CTA abdomen on 02/23/2020.  PE is located in left lower lung, but CTA chest would be needed to fully evaluate.  Due to recently recovering renal function, will need to wait at least 24 hours before another repeat contrasted CT.  She is however not symptomatic; back on O2 now but may not need it, however getting multiple units of blood so at risk for overload as well  -Follow-up lower extremity duplex: negative for DVT - not an anticoagulation candidate with large GIB currently  History of CVA (cerebrovascular accident) Subacute CVA/ small SDH on previous admit, some question regarding whether this was actually a stroke -Non-focal -restarted ASA 11/25; recurrent GB on 11/27. Follow up EGD (asa again on hold)  Urinary retention - foley in place; creatinine has improved significantly -Trial of void on 02/22/2020.  Remove Foley.  She continues to void well -On 02/23/2020, she complained of ongoing pain with urination.  Will check a urinalysis at this point; positive for lrg LE, few bacteria; given ongoing  symptoms will start Rocephin and complete 3-5 day course; follow up UCx if reflexes  Acute renal failure superimposed on stage 4 chronic kidney disease (HCC) - renal function slowly improving - daily BMP  Pulmonary nodule - LLL 44mm, consider f/u scan in 12 mo  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 (HCC) - V6H 8.8% -Continue SSI and CBG monitoring   Antimicrobials: Rocephin 02/24/2020>> current  DVT prophylaxis: SCD Code Status: Full Family Communication: None present Disposition Plan: Status is: Inpatient  Remains inpatient appropriate because:Unsafe d/c plan, IV treatments appropriate due to intensity of illness or inability to take PO and Inpatient level of care appropriate due to severity of illness   Dispo: The patient is from: CIR              Anticipated d/c is to: SNF now as too deconditioned              Anticipated d/c date is: > 3 days              Patient currently is not medically stable to d/c.  Objective: Blood pressure 121/69, pulse 98, temperature 98.2 F (36.8 C), temperature source Oral, resp. rate 20, height 5\' 8"  (1.727 m), weight 98.1 kg, SpO2 98 %.  Examination:  General appearance: alert, cooperative and no distress Head: Normocephalic, without obvious abnormality, atraumatic Eyes: EOMI Lungs: Mild coarse breath sounds with crackles.  No wheezing appreciated Heart: regular rate and rhythm and S1, S2 normal Abdomen: normal findings: bowel sounds normal and soft, non-tender Extremities: 3+ bilateral lower extremity edema, chronic Skin: normal Neurologic: Grossly normal.  Global weakness but no focal deficits  Consultants:   GI  IR  Procedures:   02/19/20: EGD  02/23/20: repeat EGD  Data Reviewed: I have personally reviewed following labs and imaging studies Results for orders placed or performed during the hospital encounter of 02/17/20 (from the past 24 hour(s))  Glucose, capillary     Status: Abnormal   Collection  Time: 02/24/20  4:24 PM  Result Value Ref Range   Glucose-Capillary 115 (H) 70 - 99 mg/dL   Comment 1 Notify RN    Comment 2 Document in Chart   CBC     Status: Abnormal   Collection Time: 02/24/20  7:07 PM  Result Value Ref Range   WBC 16.9 (H) 4.0 - 10.5 K/uL   RBC 2.44 (L) 3.87 - 5.11 MIL/uL   Hemoglobin 7.0 (L) 12.0 - 15.0 g/dL   HCT 22.1 (L) 36 - 46 %   MCV 90.6 80.0 - 100.0 fL   MCH 28.7 26.0 - 34.0 pg   MCHC 31.7 30.0 - 36.0 g/dL   RDW 16.2 (H) 11.5 - 15.5 %   Platelets 271 150 - 400 K/uL   nRBC 0.1 0.0 - 0.2 %  Prepare RBC (crossmatch)     Status: None   Collection Time: 02/24/20  7:55 PM  Result Value Ref Range   Order Confirmation      ORDER PROCESSED BY BLOOD BANK Performed at [facility], [address], [place], [place] [address]   Glucose, capillary     Status: Abnormal   Collection Time: 02/24/20  9:06 PM  Result Value Ref Range   Glucose-Capillary 125 (H) 70 - 99 mg/dL   Comment 1 Notify RN    Comment 2 Document in Chart   CBC     Status: Abnormal   Collection Time: 02/25/20  1:00 AM  Result Value Ref Range   WBC 16.9 (H) 4.0 - 10.5 K/uL   RBC 2.46 (L) 3.87 - 5.11 MIL/uL   Hemoglobin 7.0 (L) 12.0 - 15.0 g/dL   HCT 22.3 (L) 36 - 46 %   MCV 90.7 80.0 - 100.0 fL   MCH 28.5 26.0 - 34.0 pg   MCHC 31.4 30.0 - 36.0 g/dL   RDW 16.2 (H) 11.5 - 15.5 %   Platelets 275 150 - 400 K/uL   nRBC 0.0 0.0 - 0.2 %  Prepare RBC (crossmatch)     Status: None   Collection Time: 02/25/20  3:00 AM  Result Value Ref Range   Order Confirmation      ORDER PROCESSED BY BLOOD BANK Performed at [facility], [address], [place], [place] [address]   Glucose, capillary     Status: Abnormal   Collection Time: 02/25/20  6:08 AM  Result Value Ref Range   Glucose-Capillary 131 (H) 70 - 99 mg/dL   Comment 1 Notify RN    Comment 2 Document in Chart   Basic metabolic panel     Status: Abnormal   Collection Time: 02/25/20  7:00 AM  Result Value Ref Range   Sodium  140 135 - 145 mmol/L   Potassium 3.8 3.5 -  5.1 mmol/L   Chloride 106 98 - 111 mmol/L   CO2 25 22 - 32 mmol/L   Glucose, Bld 128 (H) 70 - 99 mg/dL   BUN 39 (H) 8 - 23 mg/dL   Creatinine, Ser 1.32 (H) 0.44 - 1.00 mg/dL   Calcium 7.7 (L) 8.9 - 10.3 mg/dL   GFR, Estimated 41 (L) >60 mL/min   Anion gap 9 5 - 15  Magnesium     Status: None   Collection Time: 02/25/20  7:00 AM  Result Value Ref Range   Magnesium 1.8 1.7 - 2.4 mg/dL  CBC     Status: Abnormal   Collection Time: 02/25/20  7:00 AM  Result Value Ref Range   WBC 16.1 (H) 4.0 - 10.5 K/uL   RBC 2.47 (L) 3.87 - 5.11 MIL/uL   Hemoglobin 7.0 (L) 12.0 - 15.0 g/dL   HCT 21.8 (L) 36 - 46 %   MCV 88.3 80.0 - 100.0 fL   MCH 28.3 26.0 - 34.0 pg   MCHC 32.1 30.0 - 36.0 g/dL   RDW 17.4 (H) 11.5 - 15.5 %   Platelets 274 150 - 400 K/uL   nRBC 0.0 0.0 - 0.2 %  BLOOD TRANSFUSION REPORT - SCANNED     Status: None   Collection Time: 02/25/20  9:21 AM   Narrative   Ordered by an unspecified provider.  Prepare RBC (crossmatch)     Status: None   Collection Time: 02/25/20 11:01 AM  Result Value Ref Range   Order Confirmation      ORDER PROCESSED BY BLOOD BANK BB SAMPLE OR UNITS ALREADY AVAILABLE Performed at [facility], [place] [address] [place], [place] [address]   Glucose, capillary     Status: Abnormal   Collection Time: 02/25/20 11:18 AM  Result Value Ref Range   Glucose-Capillary 138 (H) 70 - 99 mg/dL    Recent Results (from the past 240 hour(s))  Urine Culture     Status: Abnormal   Collection Time: 02/17/20  4:27 PM   Specimen: Urine, Random  Result Value Ref Range Status   Specimen Description URINE, RANDOM  Final   Special Requests NONE  Final   Culture (A)  Final    <10,000 COLONIES/mL INSIGNIFICANT GROWTH Performed at [facility], [place] [address] [place], [place] [address]    Report Status 02/19/2020 FINAL  Final     Radiology Studies: VAS [place] LOWER EXTREMITY VENOUS (DVT)  Result Date: 02/25/2020   Lower Venous DVT Study Indications: Swelling. Other Indications: Left hip fracture, status post hemiarthroplasty 02/10/20. Limitations: Pain with compression of left leg and significant edema. Comparison Study: Prior negative Bilateral lower extremity duplex done                   02/25/2018 Performing Technologist: Sharion Dove RVS  Examination Guidelines: A complete evaluation includes B-mode imaging, spectral Doppler, color Doppler, and power Doppler as needed of all accessible portions of each vessel. Bilateral testing is considered an integral part of a complete examination. Limited examinations for reoccurring indications may be performed as noted. The reflux portion of the exam is performed with the patient in reverse Trendelenburg.  +---------+---------------+---------+-----------+----------+--------------+ RIGHT    CompressibilityPhasicitySpontaneityPropertiesThrombus Aging +---------+---------------+---------+-----------+----------+--------------+ CFV      Full           Yes      Yes                                 +---------+---------------+---------+-----------+----------+--------------+  SFJ      Full                                                        +---------+---------------+---------+-----------+----------+--------------+ FV Prox  Full                                                        +---------+---------------+---------+-----------+----------+--------------+ FV Mid   Full                                                        +---------+---------------+---------+-----------+----------+--------------+ FV DistalFull                                                        +---------+---------------+---------+-----------+----------+--------------+ PFV      Full                                                        +---------+---------------+---------+-----------+----------+--------------+ POP      Full           Yes      Yes                                  +---------+---------------+---------+-----------+----------+--------------+ PTV      Full                                                        +---------+---------------+---------+-----------+----------+--------------+ PERO     Full                                                        +---------+---------------+---------+-----------+----------+--------------+   +---------+---------------+---------+-----------+----------+-------------------+ LEFT     CompressibilityPhasicitySpontaneityPropertiesThrombus Aging      +---------+---------------+---------+-----------+----------+-------------------+ CFV                     Yes      Yes                  patent by color and  Doppler             +---------+---------------+---------+-----------+----------+-------------------+ SFJ                     Yes      Yes                  patent by color and                                                       Doppler             +---------+---------------+---------+-----------+----------+-------------------+ FV Prox                 Yes      Yes                  patent by color and                                                       Doppler             +---------+---------------+---------+-----------+----------+-------------------+ FV Mid                  Yes      Yes                  patent by color and                                                       Doppler             +---------+---------------+---------+-----------+----------+-------------------+ FV Distal               Yes      Yes                  patent by color and                                                       Doppler             +---------+---------------+---------+-----------+----------+-------------------+ PFV                                                   Not well visualized  +---------+---------------+---------+-----------+----------+-------------------+ POP      Full                                                             +---------+---------------+---------+-----------+----------+-------------------+ PTV  Yes      Yes                                      +---------+---------------+---------+-----------+----------+-------------------+ PERO                    Yes      Yes                                      +---------+---------------+---------+-----------+----------+-------------------+     Summary: RIGHT: - There is no evidence of deep vein thrombosis in the lower extremity.  LEFT: - There is no evidence of deep vein thrombosis in the lower extremity. However, portions of this examination were limited- see technologist comments above.  *See table(s) above for measurements and observations. Electronically signed by Jamelle Haring on 02/25/2020 at 12:34:56 PM.    Final    CT Angio Abd/Pel w/ and/or w/o  Addendum Date: 02/24/2020   ADDENDUM REPORT: 02/24/2020 04:59 ADDENDUM: Critical Value/emergent results were called by telephone at the time of interpretation on 02/24/2020 at 4:56 am to nurse [facility] provider Chotiner who verbally acknowledged these results. Electronically Signed   By: Lucienne Capers M.D.   On: 02/24/2020 04:59   Result Date: 02/24/2020 CLINICAL DATA:  [place] EXAM: CTA ABDOMEN AND PELVIS WITHOUT AND WITH CONTRAST TECHNIQUE: Multidetector CT imaging of the abdomen and pelvis was performed using the standard protocol during bolus administration of intravenous contrast. Multiplanar reconstructed images and MIPs were obtained and reviewed to evaluate the vascular anatomy. CONTRAST:  2mL OMNIPAQUE IOHEXOL 350 MG/ML SOLN COMPARISON:  02/17/2020 FINDINGS: VASCULAR Aorta: Normal caliber aorta without aneurysm, dissection, vasculitis or significant stenosis. Aortic calcifications. Celiac: Patent without evidence of  aneurysm, dissection, vasculitis or significant stenosis. SMA: Patent without evidence of aneurysm, dissection, vasculitis or significant stenosis. Renals: Both renal arteries are patent without evidence of aneurysm, dissection, vasculitis, fibromuscular dysplasia or significant stenosis. IMA: Patent without evidence of aneurysm, dissection, vasculitis or significant stenosis. Inflow: Patent without evidence of aneurysm, dissection, vasculitis or significant stenosis. Proximal Outflow: Bilateral common femoral and visualized portions of the superficial and profunda femoral arteries are patent without evidence of aneurysm, dissection, vasculitis or significant stenosis. Veins: No obvious venous abnormality within the limitations of this arterial phase study. Review of the MIP images confirms the above findings. NON-VASCULAR Lower chest: Small bilateral pleural effusions with basilar atelectasis. Hepatobiliary: No focal liver abnormality is seen. Status post cholecystectomy. No biliary dilatation. Pancreas: Unremarkable. No pancreatic ductal dilatation or surrounding inflammatory changes. Spleen: Normal in size without focal abnormality. Adrenals/Urinary Tract: 3 cm diameter left adrenal gland nodule is unchanged since prior studies. Kidneys are symmetrical with homogeneous nephrograms. No hydronephrosis or hydroureter. Bladder wall is mildly thickened with small amount of bladder gas. This could be due to instrumentation or cystitis. Stomach/Bowel: Stomach, small bowel, and colon are not abnormally distended. No areas of contrast extravasation are identified to suggest a site of active bleeding. Appendix is normal. Lymphatic: No significant lymphadenopathy. Reproductive: Status post hysterectomy. No adnexal masses. Other: No free air or free fluid in the abdomen. Abdominal wall musculature appears intact. Prominent soft tissue edema in the subcutaneous fat of the abdomen and pelvis. Incidental note of pulmonary  emboli in the left lower lung, partially seen only on the superior most images. Musculoskeletal: Degenerative changes in the  spine. No destructive bone lesions. Left hip arthroplasty. IMPRESSION: 1. No evidence of focal contrast extravasation to suggest a site of active bleeding. 2. Aortic atherosclerosis. No significant arterial stenosis. 3. Incidental note of pulmonary emboli in the left lower lung, partially seen only on the superior most images. 4. Small bilateral pleural effusions with basilar atelectasis. 5. Bladder wall is mildly thickened with small amount of bladder gas. This could be due to instrumentation or cystitis. 6. Prominent soft tissue edema in the subcutaneous fat of the abdomen and pelvis. 7. 3 cm diameter left adrenal gland nodule is unchanged since prior studies. Aortic Atherosclerosis (ICD10-I70.0). Electronically Signed: By: Lucienne Capers M.D. On: 02/24/2020 04:38   VAS [place] LOWER EXTREMITY VENOUS (DVT)  Final Result    CT Angio Abd/Pel w/ and/or w/o  Final Result  Addendum 1 of 1  ADDENDUM REPORT: 02/24/2020 04:59    ADDENDUM:  Critical Value/emergent results were called by telephone at the time  of interpretation on 02/24/2020 at 4:56 am to nurse [facility]  provider Chotiner who verbally acknowledged these results.      Electronically Signed    By: Lucienne Capers M.D.    On: 02/24/2020 04:59      Final    DG CHEST PORT 1 VIEW  Final Result    CT ABDOMEN PELVIS WO CONTRAST  Final Result    [place] EKG SITE RITE  Final Result    DG Chest Port 1 View  Final Result      Scheduled Meds: . acetaminophen  650 mg Oral Once  . Cerefolin NAC  1 tablet Oral q AM  . Chlorhexidine Gluconate Cloth  6 each Topical Daily  . exemestane  25 mg Oral QPC breakfast  . febuxostat  40 mg Oral Daily  . feeding supplement (GLUCERNA SHAKE)  237 mL Oral TID WC  . furosemide  20 mg Intravenous Once  . Gerhardt's butt cream  1 application Topical TID  . insulin aspart  0-9  Units Subcutaneous TID AC & HS  . linagliptin  5 mg Oral Daily  . mirtazapine  15 mg Oral QHS  . multivitamin with minerals  1 tablet Oral Daily  . rosuvastatin  20 mg Oral Daily  . sodium chloride flush  10-40 mL Intracatheter Q12H   PRN Meds: acetaminophen, alum & mag hydroxide-simeth, bisacodyl, guaiFENesin-dextromethorphan, hydrocortisone, lidocaine, menthol-cetylpyridinium **OR** phenol, methocarbamol, nitroGLYCERIN, ondansetron (ZOFRAN) IV, polyethylene glycol, prochlorperazine **OR** [DISCONTINUED] prochlorperazine **OR** [DISCONTINUED] prochlorperazine, senna-docusate, sodium chloride flush, sodium phosphate, traZODone, witch hazel-glycerin Continuous Infusions: . sodium chloride 10 mL/hr at 02/18/20 1233  . cefTRIAXone (ROCEPHIN)  IV 1 g (02/24/20 1609)  . lactated ringers 75 mL/hr at 02/24/20 1856  . pantoprozole (PROTONIX) infusion       LOS: 8 days  Time spent: Greater than 50% of the 35 minute visit was spent in counseling/coordination of care for the patient as laid out in the A&P.   Dwyane Dee, MD [facility] 02/25/2020, 2:40 PM

## 2020-02-26 ENCOUNTER — Inpatient Hospital Stay (HOSPITAL_COMMUNITY): Payer: Medicare Other | Admitting: Certified Registered Nurse Anesthetist

## 2020-02-26 ENCOUNTER — Encounter (HOSPITAL_COMMUNITY): Payer: Self-pay | Admitting: Internal Medicine

## 2020-02-26 ENCOUNTER — Encounter (HOSPITAL_COMMUNITY)
Admission: AD | Disposition: A | Discharge status: Skilled Nursing Facility | Payer: Self-pay | Source: Intra-hospital | Attending: Internal Medicine

## 2020-02-26 ENCOUNTER — Ambulatory Visit: Payer: Self-pay | Admitting: *Deleted

## 2020-02-26 HISTORY — PX: ESOPHAGOGASTRODUODENOSCOPY: SHX5428

## 2020-02-26 LAB — TYPE AND SCREEN
ABO/RH(D): A POS
Antibody Screen: NEGATIVE
Donor AG Type: NEGATIVE
Donor AG Type: NEGATIVE
Donor AG Type: NEGATIVE
Donor AG Type: NEGATIVE
Donor AG Type: NEGATIVE
Donor AG Type: NEGATIVE
Donor AG Type: NEGATIVE
Unit division: 0
Unit division: 0
Unit division: 0
Unit division: 0
Unit division: 0
Unit division: 0
Unit division: 0

## 2020-02-26 LAB — GLUCOSE, CAPILLARY
Glucose-Capillary: 104 mg/dL — ABNORMAL HIGH (ref 70–99)
Glucose-Capillary: 107 mg/dL — ABNORMAL HIGH (ref 70–99)
Glucose-Capillary: 111 mg/dL — ABNORMAL HIGH (ref 70–99)
Glucose-Capillary: 114 mg/dL — ABNORMAL HIGH (ref 70–99)
Glucose-Capillary: 122 mg/dL — ABNORMAL HIGH (ref 70–99)
Glucose-Capillary: 131 mg/dL — ABNORMAL HIGH (ref 70–99)

## 2020-02-26 LAB — BASIC METABOLIC PANEL
Anion gap: 9 (ref 5–15)
BUN: 32 mg/dL — ABNORMAL HIGH (ref 8–23)
CO2: 25 mmol/L (ref 22–32)
Calcium: 8 mg/dL — ABNORMAL LOW (ref 8.9–10.3)
Chloride: 105 mmol/L (ref 98–111)
Creatinine, Ser: 1.31 mg/dL — ABNORMAL HIGH (ref 0.44–1.00)
GFR, Estimated: 41 mL/min — ABNORMAL LOW (ref >60)
Glucose, Bld: 128 mg/dL — ABNORMAL HIGH (ref 70–99)
Potassium: 3.6 mmol/L (ref 3.5–5.1)
Sodium: 139 mmol/L (ref 135–145)

## 2020-02-26 LAB — BPAM RBC
Blood Product Expiration Date: 202112252359
Blood Product Expiration Date: 202112282359
Blood Product Expiration Date: 202112292359
Blood Product Expiration Date: 202112292359
Blood Product Expiration Date: 202112292359
Blood Product Expiration Date: 202112292359
Blood Product Expiration Date: 202112292359
ISSUE DATE / TIME: 202111262050
ISSUE DATE / TIME: 202111270351
ISSUE DATE / TIME: 202111270912
ISSUE DATE / TIME: 202111272259
ISSUE DATE / TIME: 202111282029
ISSUE DATE / TIME: 202111290257
ISSUE DATE / TIME: 202111291149
Unit Type and Rh: 6200
Unit Type and Rh: 6200
Unit Type and Rh: 6200
Unit Type and Rh: 6200
Unit Type and Rh: 6200
Unit Type and Rh: 6200
Unit Type and Rh: 6200

## 2020-02-26 LAB — CBC
HCT: 26.2 % — ABNORMAL LOW (ref 36.0–46.0)
HCT: 29.2 % — ABNORMAL LOW (ref 36.0–46.0)
Hemoglobin: 8.3 g/dL — ABNORMAL LOW (ref 12.0–15.0)
Hemoglobin: 9.2 g/dL — ABNORMAL LOW (ref 12.0–15.0)
MCH: 28.2 pg (ref 26.0–34.0)
MCH: 28.1 pg (ref 26.0–34.0)
MCHC: 31.7 g/dL (ref 30.0–36.0)
MCHC: 31.5 g/dL (ref 30.0–36.0)
MCV: 89.1 fL (ref 80.0–100.0)
MCV: 89.3 fL (ref 80.0–100.0)
Platelets: 269 10*3/uL (ref 150–400)
Platelets: 298 10*3/uL (ref 150–400)
RBC: 2.94 MIL/uL — ABNORMAL LOW (ref 3.87–5.11)
RBC: 3.27 MIL/uL — ABNORMAL LOW (ref 3.87–5.11)
RDW: 17 % — ABNORMAL HIGH (ref 11.5–15.5)
RDW: 17.1 % — ABNORMAL HIGH (ref 11.5–15.5)
WBC: 12.7 10*3/uL — ABNORMAL HIGH (ref 4.0–10.5)
WBC: 12.8 10*3/uL — ABNORMAL HIGH (ref 4.0–10.5)
nRBC: 0 % (ref 0.0–0.2)
nRBC: 0 % (ref 0.0–0.2)

## 2020-02-26 LAB — HEMOGLOBIN AND HEMATOCRIT, BLOOD
HCT: 25.8 % — ABNORMAL LOW (ref 36.0–46.0)
Hemoglobin: 8 g/dL — ABNORMAL LOW (ref 12.0–15.0)

## 2020-02-26 LAB — MAGNESIUM: Magnesium: 2.1 mg/dL (ref 1.7–2.4)

## 2020-02-26 SURGERY — EGD (ESOPHAGOGASTRODUODENOSCOPY)
Anesthesia: Monitor Anesthesia Care

## 2020-02-26 MED ORDER — PROPOFOL 10 MG/ML IV BOLUS
INTRAVENOUS | Status: DC | PRN
  Administered 2020-02-26 (×2): 20 mg via INTRAVENOUS
  Administered 2020-02-26 (×3): 25 mg via INTRAVENOUS

## 2020-02-26 MED ORDER — PROPOFOL 500 MG/50ML IV EMUL
INTRAVENOUS | Status: DC | PRN
  Administered 2020-02-26: 75 ug/kg/min via INTRAVENOUS

## 2020-02-26 MED ORDER — INSULIN ASPART 100 UNIT/ML ~~LOC~~ SOLN
0.0000 [IU] | Freq: Three times a day (TID) | SUBCUTANEOUS | Status: DC
  Administered 2020-02-26: 1 [IU] via SUBCUTANEOUS
  Administered 2020-02-27: 2 [IU] via SUBCUTANEOUS
  Administered 2020-02-27 – 2020-02-28 (×3): 1 [IU] via SUBCUTANEOUS
  Administered 2020-02-28 (×2): 2 [IU] via SUBCUTANEOUS
  Administered 2020-02-29 – 2020-03-01 (×4): 1 [IU] via SUBCUTANEOUS
  Administered 2020-03-01: 2 [IU] via SUBCUTANEOUS
  Administered 2020-03-01: 1 [IU] via SUBCUTANEOUS
  Administered 2020-03-02: 2 [IU] via SUBCUTANEOUS
  Administered 2020-03-02: 1 [IU] via SUBCUTANEOUS

## 2020-02-26 MED ORDER — NYSTATIN 100000 UNIT/ML MT SUSP
5.0000 mL | Freq: Four times a day (QID) | OROMUCOSAL | Status: DC
  Administered 2020-02-26 – 2020-03-02 (×19): 500000 [IU] via ORAL
  Filled 2020-02-26 (×19): qty 5

## 2020-02-26 NOTE — Progress Notes (Signed)
PROGRESS NOTE  [name]  DOB: [birth year]  PCP: Kristen Moro, DO GNF:[id number]  DOA: 02/17/2020  LOS: 9 days   Chief complaint -weakness -  Brief narrative: [name] is an 79 year old female notable PMH for CAD s/p stent, CKD stage IV, DMT2, HFpEF, breast cancer and bilateral lower extremity edema.  She presented to the ED on 11/21 with Previously hospitalized 11/12 to 11/19 for left hip fracture s/p hemiarthroplasty 11/14 and was incidentally found to have subacute CVA and small SDH.  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 No role for surgical intervention per Neurosurgery. She was started on ASA/ plavix for stroke prevention.   11/19, she was discharged to CIR.    On 11/20, while at CIR, she complained of epigastric and abdominal discomfort with ongoing constipation. KUB showed non-obstructive gas pattered with stool present in rectum.  Overnight, she developed hypotension with ongoing abdominal and epigastric pain, nausea, SOB, and with one episode of reported emesis, unclear if bloody. Post emesis, staff report she had a wet cough with audible rales, requiring 3L [place].  Hgb 5.4, down from 8.2 on 11/20. Blood pressure was low in the 70s. She was transferred to ICU service. She received 2 units PRBC.  11/23, patient underwent EGD.  Noted to have grade D esophagitis, likely source of GI bleeding. Her hemoglobin as well as vitals stabilized.  11/24, she was transferred out of the ICU.  11/26, patient had melanotic stools and further drop in hemoglobin from 8-6.1.  She also became hypotensive and tachycardic.  She received 3 more  units of PRBCs. 11/27, patient underwent repeat EGD and was found to have cratered nonbleeding gastric ulcers and 2 large nonbleeding cratered duodenal ulcers with a large friable vessel in the duodenal bulb.  Intervention on the vessel was not performed because of high risk status.  IR evaluation was obtained for possible embolization.. IR did not recommend embolization due to the absence of active bleeding.   Patient however continued to bleed. 11/30, third EGD was obtained which showed esophageal candidiasis on top of the above-mentioned findings.  Subjective: Patient was seen and examined this morning.  Pleasant elderly Caucasian female.  Lying down in bed.  Not in distress.  Low-flow oxygen by nasal cannula. Underwent repeat EGD this morning.  Assessment/Plan: Gastrointestinal bleeding  -Transfer from CIR for acute drop in hemoglobin.  Melanotic stools noted in the hospital. -Underwent EGD on 11/23 on 11/27 and noted to have grade D esophagitis and nonbleeding gastric ulcers as well as duodenal ulcers.  Patient continued to have melanotic stool and drop in hemoglobin. -Repeat EGD (third during this hospitalization) done today 11/30 which showed esophageal plaques consistent with candidiasis, medium-sized hiatal hernia, nonbleeding gastric ulcers, nonbleeding duodenal ulcers. -If signs of ongoing further bleeding, will get a nuclear bleeding scan. -Currently on clear liquid diet. -Continue Protonix drip. -Discussed with GI.  Will start on nystatin swish and swallow  Acute blood loss anemia -From GI bleeding. -Received a total of 15 units of PRBC so far this admission. -Currently aspirin and Plavix are on hold. -Continue to monitor hemoglobin. Recent Labs    02/25/20 0100 02/25/20 0100 02/25/20 0700 02/25/20  0700 02/25/20 1928 02/25/20 1928 02/26/20 0504 02/26/20 1300  HGB 7.0*  --  7.0*  --  7.1*  --  8.3* 9.2*  MCV 90.7   < > 88.3   < > 88.0   < > 89.1 89.3   < > = values in  this interval not displayed.   s/p left hip fracture -s/p repair on 11/14 -continue PT/OT -Now too deconditioned for CIR.  Likely needs SNF  Acute respiratory failure with hypoxia -Considered due to atelectasis -Working well with spirometry -Oxygen has been weaned off on 11/25 but placed back on after starting to require multiple transfusions  Pulmonary emboli -Incidental finding on CTA abdomen on 02/23/2020. PE is located in left lower lung, but CTA chest would be needed to fully evaluate.  Due to recently recovering renal function, as well as coexisting active bleeding, patient not a candidate for anticoagulation. -DVT scan negative as well.  History of CVA -subacute CVA/ small SDH on previous admit, -Aspirin and Plavix on hold.  UTI -11/27, complaint of pain with urination.  Urinalysis with positive leukocytes, few bacteria.  Started on 25-day course of IV Rocephin.  foley in place.  Acute renal failure superimposed on stage 4 chronic kidney disease ([place]) -renal function slowly improving -daily BMP Recent Labs    02/18/20 0404 02/18/20 1820 02/19/20 0322 02/20/20 0406 02/21/20 0205 02/22/20 0523 02/23/20 0332 02/24/20 0506 02/25/20 0700 02/26/20 0504  BUN 119* 128* 124* 82* 55* 35* 33* 39* 39* 32*  CREATININE 2.19* 2.53* 2.44* 1.95* 1.61* 1.42* 1.36* 1.36* 1.32* 1.31*   Pulmonary nodule - LLL 87mm, consider f/u scan in 12 mo  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 (HCC) - Y1O 8.8% on 11/12 -Continue linagliptin, SSI and CBG monitoring  Recent Labs  Lab 02/25/20 2043 02/26/20 0003 02/26/20 0351 02/26/20 0739 02/26/20 1119  GLUCAP 151* 104* 122* 111* 107*   Chronic bilateral lower extremity lymphedema -Seems to be on LR continuously for last several days.  Has 2+ bilateral pedal edema.  Patient was on Lasix at home.  Stop IV fluid.  Mobility: PT eval obtained.  SNF recommended Code Status:   Code Status: Full Code  Nutritional  status: Body mass index is 33.29 kg/m.     Diet Order            Diet clear liquid Room service appropriate? Yes; Fluid consistency: Thin  Diet effective now                 DVT prophylaxis: Place and maintain sequential compression device Start: 02/17/20 1150 SCDs Start: 02/17/20 1120 Place TED hose Start: 02/17/20 1120   Antimicrobials:  Nystatin swish and swallow Fluid: Stop IV fluid Consultants: GI Family Communication:  Daughter at bedside  Status is: Inpatient  Remains inpatient appropriate because: Need to monitor hemoglobin  Dispo: The patient is from: CIR              Anticipated d/c is to: SNF              Anticipated d/c date is: 1 to 2 days              Patient currently is not medically stable to d/c.    Infusions:  . sodium chloride 250 mL (02/26/20 1400)  . cefTRIAXone (ROCEPHIN)  IV 1 g (02/26/20 1401)  . pantoprozole (PROTONIX) infusion 8 mg/hr (02/26/20 1316)    Scheduled Meds: . sodium chloride   Intravenous Once  . acetaminophen  650 mg  Oral Once  . Cerefolin NAC  1 tablet Oral q AM  . Chlorhexidine Gluconate Cloth  6 each Topical Daily  . exemestane  25 mg Oral QPC breakfast  . febuxostat  40 mg Oral Daily  . feeding supplement (GLUCERNA SHAKE)  237 mL Oral TID WC  . furosemide  20 mg Intravenous Once  . Gerhardt's butt cream  1 application Topical TID  . insulin aspart  0-9 Units Subcutaneous TID AC & HS  . linagliptin  5 mg Oral Daily  . mirtazapine  15 mg Oral QHS  . multivitamin with minerals  1 tablet Oral Daily  . nystatin  5 mL Oral QID  . rosuvastatin  20 mg Oral Daily  . sodium chloride flush  10-40 mL Intracatheter Q12H    Antimicrobials: Anti-infectives (From admission, onward)   Start     Dose/Rate Route Frequency Ordered Stop   02/24/20 1400  cefTRIAXone (ROCEPHIN) 1 g in sodium chloride 0.9 % 100 mL IVPB        1 g 200 mL/hr over 30 Minutes Intravenous Every 24 hours 02/24/20 1311        PRN meds: acetaminophen,  alum & mag hydroxide-simeth, bisacodyl, guaiFENesin-dextromethorphan, hydrocortisone, lidocaine, menthol-cetylpyridinium **OR** phenol, methocarbamol, nitroGLYCERIN, ondansetron (ZOFRAN) IV, polyethylene glycol, prochlorperazine **OR** [DISCONTINUED] prochlorperazine **OR** [DISCONTINUED] prochlorperazine, senna-docusate, sodium chloride flush, sodium phosphate, traZODone, witch hazel-glycerin   Objective: Vitals:   02/26/20 1100 02/26/20 1121  BP: (!) 154/65 (!) 153/66  Pulse: 95   Resp: (!) 22   Temp:  97.8 F (36.6 C)  SpO2: 100% 99%    Intake/Output Summary (Last 24 hours) at 02/26/2020 1502 Last data filed at 02/26/2020 1339 Gross per 24 hour  Intake 3061.79 ml  Output 1451 ml  Net 1610.79 ml   Filed Weights   02/25/20 0011 02/26/20 0007 02/26/20 0822  Weight: 98.1 kg 99.3 kg 99.3 kg   Weight change: 1.2 kg Body mass index is 33.29 kg/m.   Physical Exam: General exam: Not in physical distress Skin: No rashes, lesions or ulcers. HEENT: Atraumatic, normocephalic, no obvious bleeding Lungs: Clear to auscultation bilaterally CVS: Regular rate and rhythm, no murmur GI/Abd soft, nontender, nondistended, bowel present CNS: Alert, awake monitor x3 Psychiatry: Mood appropriate Extremities: Chronic lymphedema bilateral 2+.  Data Review: I have personally reviewed the laboratory data and studies available.  Recent Labs  Lab 02/25/20 0100 02/25/20 0700 02/25/20 1928 02/26/20 0504 02/26/20 1300  WBC 16.9* 16.1* 13.3* 12.7* 12.8*  HGB 7.0* 7.0* 7.1* 8.3* 9.2*  HCT 22.3* 21.8* 22.1* 26.2* 29.2*  MCV 90.7 88.3 88.0 89.1 89.3  PLT 275 274 252 269 298   Recent Labs  Lab 02/20/20 0406 02/20/20 0406 02/21/20 0205 02/21/20 0205 02/22/20 0523 02/23/20 0332 02/24/20 0506 02/25/20 0700 02/26/20 0504  NA 139   < > 139   < > 141 139 140 140 139  K 3.0*   < > 3.4*   < > 3.6 4.0 3.8 3.8 3.6  CL 106   < > 107   < > 106 106 107 106 105  CO2 24   < > 23   < > 24 25 25 25 25    GLUCOSE 110*   < > 117*   < > 115* 131* 117* 128* 128*  BUN 82*   < > 55*   < > 35* 33* 39* 39* 32*  CREATININE 1.95*   < > 1.61*   < > 1.42* 1.36* 1.36* 1.32* 1.31*  CALCIUM 8.0*   < >  8.0*   < > 8.2* 7.3* 7.6* 7.7* 8.0*  MG 2.6*   < > 2.4   < > 2.5* 2.0 2.1 1.8 2.1  PHOS 3.5  --  2.4*  --  2.4* 2.9  --   --   --    < > = values in this interval not displayed.    F/u labs ordered  Signed, Terrilee Croak, MD [facility] 02/26/2020

## 2020-02-26 NOTE — Anesthesia Postprocedure Evaluation (Signed)
Anesthesia Post Note  Patient: [name]  Procedure(s) Performed: ESOPHAGOGASTRODUODENOSCOPY (EGD) (N/A )     Patient location during evaluation: Endoscopy Anesthesia Type: MAC Level of consciousness: awake and alert Pain management: pain level controlled Vital Signs Assessment: post-procedure vital signs reviewed and stable Respiratory status: spontaneous breathing, nonlabored ventilation, respiratory function stable and patient connected to nasal cannula oxygen Cardiovascular status: stable and blood pressure returned to baseline Postop Assessment: no apparent nausea or vomiting Anesthetic complications: no   No complications documented.  Last Vitals:  Vitals:   02/26/20 1100 02/26/20 1121  BP: (!) 154/65 (!) 153/66  Pulse: 95   Resp: (!) 22   Temp:  36.6 C  SpO2: 100% 99%    Last Pain:  Vitals:   02/26/20 1040  TempSrc: Tympanic  PainSc:                  Ashur Glatfelter COKER

## 2020-02-26 NOTE — Progress Notes (Signed)
[place] that insulin is scheduled ACHs but CBG are q4 . Arthor Captain LPN

## 2020-02-26 NOTE — Progress Notes (Signed)
[facility] Gastroenterology Progress Note  [name] 79 y.o. 1940/09/03  Patient sleeping.  Discussed with RN and NT.  Patient has had 2 smaller-volume episodes of hematochezia since returning to floor.  NT and RN that patient was actually having dark red stools yesterday as opposed to melenic stools, which were previously reported.  Per chart review, last documented colonoscopy was in 2002, which revealed internal hemorrhoids but was otherwise unremarkable, though history of polyps was listed as the reason for exam.  Objective: Vital signs: Vitals:   02/26/20 1100 02/26/20 1121  BP: (!) 154/65 (!) 153/66  Pulse: 95   Resp: (!) 22   Temp:  97.8 F (36.6 C)  SpO2: 100% 99%    Physical Exam: Gen: Sleeping CV: RRR Chest: Breathing comfortably on room air  Lab Results: Recent Labs    02/25/20 0700 02/26/20 0504  NA 140 139  K 3.8 3.6  CL 106 105  CO2 25 25  GLUCOSE 128* 128*  BUN 39* 32*  CREATININE 1.32* 1.31*  CALCIUM 7.7* 8.0*  MG 1.8 2.1   No results for input(s): AST, ALT, ALKPHOS, BILITOT, PROT, ALBUMIN in the last 72 hours. Recent Labs    02/26/20 0504 02/26/20 1300  WBC 12.7* 12.8*  HGB 8.3* 9.2*  HCT 26.2* 29.2*  MCV 89.1 89.3  PLT 269 298    Assessment/Plan: Rectal bleeding: Hemoglobin 9.2 as of 13:00.  Hemoglobin 8.3 as of 05:04, improved from 7.1 after 1u pRBCs.   Continue clear liquid diet.    We will reassess tomorrow.  If persistent rectal bleeding, we will consider colonoscopy vs. Nuclear bleeding scan.  In the meantime, if destabilizing bleeding occurs, recommend CT-A.  Nuclear bleeding scan could be considered if persistent bleeding.  Salley Slaughter 02/26/2020, 4:21 PM  Questions please call [phone]

## 2020-02-26 NOTE — Anesthesia Preprocedure Evaluation (Signed)
Anesthesia Evaluation  Patient identified by MRN, date of birth, ID band Patient awake    Reviewed: Allergy & Precautions, NPO status , Patient's Chart, lab work & pertinent test results  Airway Mallampati: II  TM Distance: >3 FB     Dental  (+) Edentulous Upper, Edentulous Lower   Pulmonary     + decreased breath sounds      Cardiovascular hypertension,  Rhythm:Regular Rate:Normal     Neuro/Psych    GI/Hepatic   Endo/Other  diabetes  Renal/GU      Musculoskeletal   Abdominal   Peds  Hematology   Anesthesia Other Findings   Reproductive/Obstetrics                             Anesthesia Physical Anesthesia Plan  ASA: IV  Anesthesia Plan: MAC   Post-op Pain Management:    Induction: Intravenous  PONV Risk Score and Plan: Ondansetron and Dexamethasone  Airway Management Planned: Natural Airway and Nasal Cannula  Additional Equipment:   Intra-op Plan:   Post-operative Plan:   Informed Consent: I have reviewed the patients History and Physical, chart, labs and discussed the procedure including the risks, benefits and alternatives for the proposed anesthesia with the patient or authorized representative who has indicated his/her understanding and acceptance.       Plan Discussed with: CRNA and Anesthesiologist  Anesthesia Plan Comments:         Anesthesia Quick Evaluation

## 2020-02-26 NOTE — Progress Notes (Signed)
OT Cancellation Note  Patient Details Name: [name] MRN: [id number] DOB: 10-26-1940   Cancelled Treatment:    Reason Eval/Treat Not Completed: Fatigue/lethargy limiting ability to participate. Attempted to seep x 2 today. Pt eating on first attempt, refused second attempt due to fatigue/lethargy. OT will follow up next available time  Britt Bottom 02/26/2020, 3:31 PM

## 2020-02-26 NOTE — Progress Notes (Signed)
[name] 8:53 AM  Subjective: Patient says her bowels are not as dark and has no new complaints we rediscussed of the procedure  Objective: Vital signs stable afebrile no acute distress exam please see preassessment evaluation hemoglobin increased status post transfusion BUN and creatinine stable Assessment: Duodenal ulcer with visible vessel want a repeat endoscopy due to signs of ongoing bleeding  Plan: Okay to proceed with endoscopy with anesthesia assistance  [facility]  office [phone] After 5PM or if no answer call [phone]

## 2020-02-26 NOTE — Transfer of Care (Signed)
Immediate Anesthesia Transfer of Care Note  Patient: [name]  Procedure(s) Performed: ESOPHAGOGASTRODUODENOSCOPY (EGD) (N/A )  Patient Location: Endoscopy Unit  Anesthesia Type:MAC  Level of Consciousness: drowsy  Airway & Oxygen Therapy: Patient Spontanous Breathing and Patient connected to nasal cannula oxygen  Post-op Assessment: Report given to RN and Post -op Vital signs reviewed and stable  Post vital signs: Reviewed and stable  Last Vitals:  Vitals Value Taken Time  BP    Temp    Pulse    Resp    SpO2      Last Pain:  Vitals:   02/26/20 0822  TempSrc:   PainSc: 0-No pain      Patients Stated Pain Goal: 0 (99/87/21 5872)  Complications: No complications documented.

## 2020-02-26 NOTE — Op Note (Signed)
[facility] Patient Name: [name] Procedure Date : 02/26/2020 MRN: [id number] Attending MD: Clarene Essex , MD Date of Birth: 1941/03/21 CSN: [id number] Age: 79 Admit Type: Inpatient Procedure:                Upper GI endoscopy Indications:              Melena history of duodenal ulcer with visible vessel Providers:                Clarene Essex, MD, Josie Dixon, RN, Cletis Athens,                            Technician, Lesia Sago, Technician, Pearline Cables, CRNA Referring MD:              Medicines:                Propofol total dose 732 mg IV Complications:            No immediate complications. Estimated Blood Loss:     Estimated blood loss: none. Procedure:                Pre-Anesthesia Assessment:                           - Prior to the procedure, a History and Physical                            was performed, and patient medications and                            allergies were reviewed. The patient's tolerance of                            previous anesthesia was also reviewed. The risks                            and benefits of the procedure and the sedation                            options and risks were discussed with the patient.                            All questions were answered, and informed consent                            was obtained. Prior Anticoagulants: The patient has                            taken no previous anticoagulant or antiplatelet                            agents. ASA Grade Assessment: III - A patient with  severe systemic disease. After reviewing the risks                            and benefits, the patient was deemed in                            satisfactory condition to undergo the procedure.                           After obtaining informed consent, the endoscope was                            passed under direct vision. Throughout the                             procedure, the patient's blood pressure, pulse, and                            oxygen saturations were monitored continuously. The                            GIF-H190 ([id number]) Olympus gastroscope was                            introduced through the mouth, and advanced to the                            third part of duodenum. The upper GI endoscopy was                            accomplished without difficulty. The patient                            tolerated the procedure well. Scope In: Scope Out: Findings:      Patchy, white plaques were found in the middle third of the esophagus       and in the lower third of the esophagus.      A medium-sized hiatal hernia was present.      One benign-appearing, intrinsic mild stenosis was found. The stenosis       was traversed.      A few small semi-sessile polyps were found in the gastric body.      One non-bleeding cratered gastric ulcer with no stigmata of bleeding was       found in the gastric antrum.      Few non-bleeding cratered duodenal ulcers with no stigmata of bleeding       were found in the duodenal bulb.      The second portion of the duodenum and third portion of the duodenum       were normal.      The exam was otherwise without abnormality. Impression:               - Esophageal plaques were found, consistent with                            candidiasis.                           -  Medium-sized hiatal hernia.                           - Benign-appearing esophageal stenosis.                           - A few gastric polyps.                           - Non-bleeding gastric ulcer with no stigmata of                            bleeding.                           - Non-bleeding duodenal ulcers with no stigmata of                            bleeding.                           - Normal second portion of the duodenum and third                            portion of the duodenum.                           - The examination was  otherwise normal.                           - No specimens collected. Recommendation:           - Clear liquid diet today. If signs of ongoing                            bleeding please get stat nuclear bleeding scan                           - Continue present medications.                           - Return to GI clinic PRN.                           - Telephone GI clinic if symptomatic PRN. Procedure Code(s):        --- Professional ---                           [phone], Esophagogastroduodenoscopy, flexible,                            transoral; diagnostic, including collection of                            specimen(s) by brushing or washing, when performed                            (  separate procedure) Diagnosis Code(s):        --- Professional ---                           K22.9, Disease of esophagus, unspecified                           K44.9, Diaphragmatic hernia without obstruction or                            gangrene                           K22.2, Esophageal obstruction                           K31.7, Polyp of stomach and duodenum                           K25.9, Gastric ulcer, unspecified as acute or                            chronic, without hemorrhage or perforation                           K26.9, Duodenal ulcer, unspecified as acute or                            chronic, without hemorrhage or perforation                           K92.1, Melena (includes Hematochezia) CPT copyright 2019 American Medical Association. All rights reserved. The codes documented in this report are preliminary and upon coder review may  be revised to meet current compliance requirements. Clarene Essex, MD 02/26/2020 10:48:15 AM This report has been signed electronically. Number of Addenda: 0

## 2020-02-26 NOTE — Anesthesia Procedure Notes (Signed)
Procedure Name: MAC Date/Time: 02/26/2020 10:22 AM Performed by: Colin Benton, CRNA Pre-anesthesia Checklist: Patient identified, Emergency Drugs available, Suction available and Patient being monitored Patient Re-evaluated:Patient Re-evaluated prior to induction Oxygen Delivery Method: Nasal cannula Induction Type: IV induction Placement Confirmation: positive ETCO2 Dental Injury: Teeth and Oropharynx as per pre-operative assessment

## 2020-02-27 LAB — GLUCOSE, CAPILLARY
Glucose-Capillary: 113 mg/dL — ABNORMAL HIGH (ref 70–99)
Glucose-Capillary: 149 mg/dL — ABNORMAL HIGH (ref 70–99)
Glucose-Capillary: 153 mg/dL — ABNORMAL HIGH (ref 70–99)
Glucose-Capillary: 166 mg/dL — ABNORMAL HIGH (ref 70–99)

## 2020-02-27 LAB — CBC WITH DIFFERENTIAL/PLATELET
Abs Immature Granulocytes: 0.05 10*3/uL (ref 0.00–0.07)
Basophils Absolute: 0.1 10*3/uL (ref 0.0–0.1)
Basophils Relative: 1 %
Eosinophils Absolute: 0.4 10*3/uL (ref 0.0–0.5)
Eosinophils Relative: 5 %
HCT: 26.8 % — ABNORMAL LOW (ref 36.0–46.0)
Hemoglobin: 8.2 g/dL — ABNORMAL LOW (ref 12.0–15.0)
Immature Granulocytes: 1 %
Lymphocytes Relative: 9 %
Lymphs Abs: 0.9 10*3/uL (ref 0.7–4.0)
MCH: 27.6 pg (ref 26.0–34.0)
MCHC: 30.6 g/dL (ref 30.0–36.0)
MCV: 90.2 fL (ref 80.0–100.0)
Monocytes Absolute: 0.6 10*3/uL (ref 0.1–1.0)
Monocytes Relative: 7 %
Neutro Abs: 7.3 10*3/uL (ref 1.7–7.7)
Neutrophils Relative %: 77 %
Platelets: 286 10*3/uL (ref 150–400)
RBC: 2.97 MIL/uL — ABNORMAL LOW (ref 3.87–5.11)
RDW: 17 % — ABNORMAL HIGH (ref 11.5–15.5)
WBC: 9.3 10*3/uL (ref 4.0–10.5)
nRBC: 0 % (ref 0.0–0.2)

## 2020-02-27 LAB — BASIC METABOLIC PANEL
Anion gap: 10 (ref 5–15)
BUN: 20 mg/dL (ref 8–23)
CO2: 24 mmol/L (ref 22–32)
Calcium: 8.3 mg/dL — ABNORMAL LOW (ref 8.9–10.3)
Chloride: 106 mmol/L (ref 98–111)
Creatinine, Ser: 1.31 mg/dL — ABNORMAL HIGH (ref 0.44–1.00)
GFR, Estimated: 41 mL/min — ABNORMAL LOW (ref >60)
Glucose, Bld: 128 mg/dL — ABNORMAL HIGH (ref 70–99)
Potassium: 3.7 mmol/L (ref 3.5–5.1)
Sodium: 140 mmol/L (ref 135–145)

## 2020-02-27 LAB — MAGNESIUM: Magnesium: 2.1 mg/dL (ref 1.7–2.4)

## 2020-02-27 MED ORDER — PANTOPRAZOLE SODIUM 40 MG IV SOLR
40.0000 mg | Freq: Two times a day (BID) | INTRAVENOUS | Status: DC
  Administered 2020-02-27 – 2020-03-02 (×9): 40 mg via INTRAVENOUS
  Filled 2020-02-27 (×9): qty 40

## 2020-02-27 NOTE — TOC Progression Note (Signed)
Transition of Care [facility]) - Progression Note    Patient Details  Name: [name] MRN: [id number] Date of Birth: June 29, 1940  [facility] of [facility]) CM/SW [place], [place] Phone Number: 02/27/2020, 4:07 PM  Clinical Narrative:    CSW reached out to patients daughter [name] in reference to SNF placement for pt. [name] was under the impression that pt would be returning to CIR. CSW advised that pt was no longer eligible for CIR. CSW and [name] discussed SNF offers and CSW emailed list of offers and provided medicare.gov ratings list. [name] states she is not interested in pt going to [facility]. CSW expanded SNF search and sent [name] current offers. [name] states she will speak with her family and try to have a decision soon. CSW will continue to follow.         Expected Discharge Plan and Services                                                 Social Determinants of Health (SDOH) Interventions    Readmission Risk Interventions No flowsheet data found.

## 2020-02-27 NOTE — Progress Notes (Signed)
Occupational Therapy Treatment Patient Details Name: [name] MRN: [id number] DOB: 08-May-1940 Today's Date: 02/27/2020    History of present illness 79 y.o. female admitted back to the hospital from inpatient rehab on 02/17/20 secondary to acute blood loss anemia and hemodynamic instability.  Dx with hemorrhagic shock secondary to UGIB (esophagitis likely cause), hypoxemia related to atelectasis, mild AKI.  Pt with recent PMH of subacute CVA/ small subdural hematoma, L hip fx s/p repair on 02/10/20 (WBAT with posterior precautions), peripheral neuropathy, gout, HTN, dementia/mild cognitive impairment, DM, CAD, CKD, Charcot's Bil feet, BreastCA bil s/p bil lumpectomy, R ankle fusion 01/2018.    OT comments  Pt remains highly motivated, but limited by repeated stools throughout session. Demonstrated ability to roll for pericare with mod assist and stood x 1 from elevated bed with +2 mod assist. Sat EOB and participated in LB exercises and grooming. Pt now requiring 3L 02. Encouraged use of IS, pt verbalized understanding. Continues to be appropriate for SNF level rehab.   Follow Up Recommendations  SNF;Supervision/Assistance - 24 hour    Equipment Recommendations  None recommended by OT    Recommendations for Other Services      Precautions / Restrictions Precautions Precautions: Posterior Hip;Fall Precaution Booklet Issued: Yes (comment) Precaution Comments: reviewed hip precautions with handout provided Required Braces or Orthoses: Other Brace Other Brace: Pt has foot braces due to charcot which are currently at home and she does not use because they malfuctioned and caused a fall. Restrictions Weight Bearing Restrictions: No LLE Weight Bearing: Weight bearing as tolerated Other Position/Activity Restrictions: posterior hip precautions       Mobility Bed Mobility Overal bed mobility: Needs Assistance Bed Mobility: Rolling;Supine to Sit;Sit to Supine Rolling: Mod assist    Supine to sit: Max assist;+2 for physical assistance Sit to supine: Max assist;+2 for physical assistance   General bed mobility comments: ModA for rolling to right and left for peri care. MaxA + 2 for supine <> sit with assist for trunk and BLE management.    Transfers Overall transfer level: Needs assistance Equipment used: Rolling walker (2 wheeled) Transfers: Sit to/from Stand Sit to Stand: +2 physical assistance;Mod assist         General transfer comment: ModA + 2 to rise from elevated edge of bed, cues for hand placement    Balance Overall balance assessment: Needs assistance Sitting-balance support: Feet supported Sitting balance-Leahy Scale: Fair Sitting balance - Comments: Able to brush hair with supervision   Standing balance support: Bilateral upper extremity supported;During functional activity Standing balance-Leahy Scale: Poor Standing balance comment: requires UE and external support                            ADL either performed or assessed with clinical judgement   ADL Overall ADL's : Needs assistance/impaired                             Toileting- Clothing Manipulation and Hygiene: Total assistance;+2 for physical assistance;Adhering to hip precautions;Bed level Toileting - Clothing Manipulation Details (indicate cue type and reason): pt with constant stools       General ADL Comments: pt willing to mobilize with therapies, but limited by constant stools     Vision       Perception     Praxis      Cognition Arousal/Alertness: Awake/alert Behavior During Therapy: WFL for tasks assessed/performed Overall  Cognitive Status: History of cognitive impairments - at baseline Area of Impairment: Memory                     Memory: Decreased short-term memory Following Commands: Follows one step commands consistently       General Comments: hx of dementia per chart, but cognition WFL for task assessed.         Exercises    Shoulder Instructions       General Comments      Pertinent Vitals/ Pain       Pain Assessment: Faces Faces Pain Scale: Hurts even more Pain Location: L hip Pain Descriptors / Indicators: Grimacing;Guarding Pain Intervention(s): Repositioned  Home Living                                          Prior Functioning/Environment              Frequency  Min 2X/week        Progress Toward Goals  OT Goals(current goals can now be found in the care plan section)  Progress towards OT goals: Progressing toward goals  Acute Rehab OT Goals Patient Stated Goal: to get stronger OT Goal Formulation: With patient Time For Goal Achievement: 03/03/20 Potential to Achieve Goals: Good  Plan Discharge plan remains appropriate    Co-evaluation    PT/OT/SLP Co-Evaluation/Treatment: Yes Reason for Co-Treatment: Complexity of the patient's impairments (multi-system involvement);For patient/therapist safety PT goals addressed during session: Mobility/safety with mobility OT goals addressed during session: ADL's and self-care      AM-PAC OT "6 Clicks" Daily Activity     Outcome Measure   Help from another person eating meals?: None Help from another person taking care of personal grooming?: A Little Help from another person toileting, which includes using toliet, bedpan, or urinal?: Total Help from another person bathing (including washing, rinsing, drying)?: A Lot Help from another person to put on and taking off regular upper body clothing?: A Little Help from another person to put on and taking off regular lower body clothing?: Total 6 Click Score: 14    End of Session Equipment Utilized During Treatment: Oxygen;Gait belt;Rolling walker  OT Visit Diagnosis: Unsteadiness on feet (R26.81);Other abnormalities of gait and mobility (R26.89);Pain;Muscle weakness (generalized) (M62.81)   Activity Tolerance Patient tolerated treatment well    Patient Left in bed;with call bell/phone within reach;with bed alarm set   Nurse Communication  (secretary to alert NT of need to replace purewick)        Time: 0821-0907 OT Time Calculation (min): 46 min  Charges: OT General Charges $OT Visit: 1 Visit OT Treatments $Therapeutic Activity: 8-22 mins  [name], OTR/L Acute Rehabilitation Services Pager: [phone] Office: [phone]   [name] 02/27/2020, 10:43 AM

## 2020-02-27 NOTE — Progress Notes (Signed)
Physical Therapy Treatment Patient Details Name: [name] MRN: [id number] DOB: [birth year] Today's Date: 02/27/2020    History of Present Illness 79 y.o. female admitted back to the hospital from inpatient rehab on 02/17/20 secondary to acute blood loss anemia and hemodynamic instability.  Dx with hemorrhagic shock secondary to UGIB (esophagitis likely cause), hypoxemia related to atelectasis, mild AKI.  Pt with recent PMH of subacute CVA/ small subdural hematoma, L hip fx s/p repair on 02/10/20 (WBAT with posterior precautions), peripheral neuropathy, gout, HTN, dementia/mild cognitive impairment, DM, CAD, CKD, Charcot's Bil feet, BreastCA bil s/p bil lumpectomy, R ankle fusion 01/2018.     PT Comments    Pt progressing towards physical therapy goals, however, unfortunately, remains limited by bowel urgency and frequency. Assisted pt with peri care multiple times throughout session due to episodes of loose, dark stools. Pt requiring two person mod-max assist for functional mobility. Able to stand from edge of bed to walker today. Performed IS x 10 times, pulling ~750 ml. Pt remains very pleasant and motivated to participate.     Follow Up Recommendations  SNF     Equipment Recommendations  3in1 (PT);Wheelchair (measurements PT);Wheelchair cushion (measurements PT);Hospital bed    Recommendations for Other Services       Precautions / Restrictions Precautions Precautions: Posterior Hip;Fall Precaution Booklet Issued: Yes (comment) Precaution Comments: reviewed hip precautions with handout provided Other Brace: Pt has foot braces due to charcot which are currently at home and she does not use because they malfuctioned and caused a fall. Restrictions Weight Bearing Restrictions: No LLE Weight Bearing: Weight bearing as tolerated    Mobility  Bed Mobility Overal bed mobility: Needs Assistance Bed Mobility: Rolling;Supine to Sit;Sit to Supine Rolling: Mod assist   Supine to  sit: Max assist;+2 for physical assistance Sit to supine: Max assist;+2 for physical assistance   General bed mobility comments: ModA for rolling to right and left for peri care. MaxA + 2 for supine <> sit with assist for trunk and BLE management.    Transfers Overall transfer level: Needs assistance Equipment used: Rolling walker (2 wheeled) Transfers: Sit to/from Stand Sit to Stand: +2 physical assistance;Mod assist         General transfer comment: ModA + 2 to rise from edge of bed, cues for hand placement  Ambulation/Gait                 Stairs             Wheelchair Mobility    Modified Rankin (Stroke Patients Only)       Balance Overall balance assessment: Needs assistance Sitting-balance support: Feet supported Sitting balance-Leahy Scale: Fair Sitting balance - Comments: Able to brush hair with supervision   Standing balance support: Bilateral upper extremity supported;During functional activity Standing balance-Leahy Scale: Poor Standing balance comment: requires UE support                            Cognition Arousal/Alertness: Awake/alert Behavior During Therapy: WFL for tasks assessed/performed Overall Cognitive Status: History of cognitive impairments - at baseline Area of Impairment: Memory                     Memory: Decreased short-term memory Following Commands: Follows one step commands consistently       General Comments: Pt pleasant, motivated to participate, has listed h/o dementia in chart.       Exercises General Exercises -  Lower Extremity Long Arc Quad: Both;10 reps;Seated    General Comments        Pertinent Vitals/Pain Pain Assessment: Faces Faces Pain Scale: Hurts even more Pain Location: L hip Pain Descriptors / Indicators: Grimacing;Guarding Pain Intervention(s): Limited activity within patient's tolerance;Monitored during session    Home Living                      Prior  Function            PT Goals (current goals can now be found in the care plan section) Acute Rehab PT Goals Patient Stated Goal: to get stronger Potential to Achieve Goals: Fair Progress towards PT goals: Progressing toward goals    Frequency    Min 3X/week      PT Plan Current plan remains appropriate    Co-evaluation PT/OT/SLP Co-Evaluation/Treatment: Yes Reason for Co-Treatment: Complexity of the patient's impairments (multi-system involvement);For patient/therapist safety;To address functional/ADL transfers PT goals addressed during session: Mobility/safety with mobility        AM-PAC PT "6 Clicks" Mobility   Outcome Measure  Help needed turning from your back to your side while in a flat bed without using bedrails?: A Lot Help needed moving from lying on your back to sitting on the side of a flat bed without using bedrails?: A Lot Help needed moving to and from a bed to a chair (including a wheelchair)?: Total Help needed standing up from a chair using your arms (e.g., wheelchair or bedside chair)?: A Lot Help needed to walk in hospital room?: Total Help needed climbing 3-5 steps with a railing? : Total 6 Click Score: 9    End of Session Equipment Utilized During Treatment: Gait belt;Oxygen Activity Tolerance: Other (comment) (limited by bowel urgency/frequency) Patient left: in bed;with call bell/phone within reach;with bed alarm set Nurse Communication: Mobility status PT Visit Diagnosis: Muscle weakness (generalized) (M62.81);Pain;Other abnormalities of gait and mobility (R26.89);Unsteadiness on feet (R26.81);Difficulty in walking, not elsewhere classified (R26.2) Pain - Right/Left: Left Pain - part of body: Hip     Time: 0820-0906 PT Time Calculation (min) (ACUTE ONLY): 46 min  Charges:  $Therapeutic Activity: 23-37 mins                     Wyona Almas, PT, DPT Acute Rehabilitation Services Pager [phone] Office [phone]    Deno Etienne 02/27/2020, 9:54 AM

## 2020-02-27 NOTE — Plan of Care (Signed)

## 2020-02-27 NOTE — Progress Notes (Signed)
[facility] Gastroenterology Progress Note  [name] 79 y.o. [birth year] Patient states she had an episode of chest tightness/pressure overnight for 1 hour, resolved after nasal cannula placement.  Denies any abdominal pain, nausea, or vomiting.  Discussed with RN and NT.  Patient has had 2 melenic bowel movements today without any red blood.  Records reviewed; patient actually had a colonoscopy 2012: diverticulosis, otherwise unremarkable.  Objective: Vital signs: Vitals:   02/27/20 0733 02/27/20 0836  BP: (!) 157/93 (!) 157/93  Pulse: 97 97  Resp: (!) 28 (!) 28  Temp: 97.8 F (36.6 C) 97.8 F (36.6 C)  SpO2: 99%     Physical Exam: Physical Exam Constitutional:      General: She is not in acute distress. Cardiovascular:     Rate and Rhythm: Normal rate and regular rhythm.     Pulses: Normal pulses.  Pulmonary:     Effort: Mild tachypnea. No respiratory distress.     Breath sounds: Normal breath sounds.  Abdominal:     General: Bowel sounds are normal. There is no distension.     Palpations: Abdomen is soft. There is no mass.     Tenderness: There is no abdominal tenderness. There is no guarding or rebound.     Hernia: No hernia is present.  Musculoskeletal:     Right lower leg: Edema present.     Left lower leg: Edema present.  Skin:    General: Skin is warm and dry.  Neurological:     General: No focal deficit present.     Mental Status: She is alert and oriented to person, place, and time.  Psychiatric:        Mood and Affect: Mood normal.        Behavior: Behavior normal.      Lab Results: Recent Labs    02/26/20 0504 02/27/20 0517  NA 139 140  K 3.6 3.7  CL 105 106  CO2 25 24  GLUCOSE 128* 128*  BUN 32* 20  CREATININE 1.31* 1.31*  CALCIUM 8.0* 8.3*  MG 2.1 2.1   No results for input(s): AST, ALT, ALKPHOS, BILITOT, PROT, ALBUMIN in the last 72 hours. Recent Labs    02/26/20 1300 02/26/20 1300 02/26/20 2330 02/27/20 0517  WBC 12.8*  --   --   9.3  NEUTROABS  --   --   --  7.3  HGB 9.2*   < > 8.0* 8.2*  HCT 29.2*   < > 25.8* 26.8*  MCV 89.3  --   --  90.2  PLT 298  --   --  286   < > = values in this interval not displayed.    Assessment/Plan: Rectal bleeding: Hemoglobin 8.2, decrease from 9.2 yesterday.  Had 2 melenic bowel movements today, likely old blood. Patient hemodynamically stable with regular heart rate and normotensive to hypertensive BP.  Continue clear liquid diet.    If persistent rectal bleeding, hematochezia, consider colonoscopy vs. Nuclear bleeding scan.  Patient's last colonoscopy in 2012 was unremarkable with the exception of diverticulosis.  Patient has a history of colon polyps but this preceded 2002 and 2012 colonoscopies.  [facility] will follow.  Salley Slaughter 02/27/2020, 9:54 AM  Questions please call [phone]

## 2020-02-27 NOTE — Progress Notes (Signed)
PROGRESS NOTE  [name]  DOB: 31-Aug-1940  PCP: Kristen Moro, DO SEG:[id number]  DOA: 02/17/2020  LOS: 10 days   Chief complaint -weakness -  Brief narrative: [name] is an 79 year old female notable PMH for CAD s/p stent, CKD stage IV, DMT2, HFpEF, breast cancer and bilateral lower extremity edema.  She presented to the ED on 11/21 with Previously hospitalized 11/12 to 11/19 for left hip fracture s/p hemiarthroplasty 11/14 and was incidentally found to have subacute CVA and small SDH.  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 No role for surgical intervention per Neurosurgery. She was started on ASA/ plavix for stroke prevention.   11/19, she was discharged to CIR.    On 11/20, while at CIR, she complained of epigastric and abdominal discomfort with ongoing constipation. KUB showed non-obstructive gas pattered with stool present in rectum.  Overnight, she developed hypotension with ongoing abdominal and epigastric pain, nausea, SOB, and with one episode of reported emesis, unclear if bloody. Post emesis, staff report she had a wet cough with audible rales, requiring 3L [place].  Hgb 5.4, down from 8.2 on 11/20. Blood pressure was low in the 70s. She was transferred to ICU service. She received 2 units PRBC.  11/23, patient underwent EGD.  Noted to have grade D esophagitis, likely source of GI bleeding. Her hemoglobin as well as vitals stabilized.  11/24, she was transferred out of the ICU.  11/26, patient had melanotic stools and further drop in hemoglobin from 8-6.1.  She also became hypotensive and tachycardic.  She received 3 more  units of PRBCs. 11/27, patient underwent repeat EGD and was found to have cratered nonbleeding gastric ulcers and 2 large nonbleeding cratered duodenal ulcers with a large friable vessel in the duodenal bulb.  Intervention on the vessel was not performed because of high risk status.  IR evaluation was obtained for possible embolization.. IR did not recommend embolization due to the absence of active bleeding.   Patient however continued to bleed. 11/30, third EGD was obtained which showed esophageal candidiasis on top of the above-mentioned findings.  Subjective: Patient was seen and examined this morning.   Lying on bed.  Not in distress.  On 2 L oxygen by nasal cannula.   Report to episodes of black tarry stool this morning without blood.   Blood pressure 157/93 this morning, creatinine stable at 1.31, hemoglobin stable at 8.2.  Assessment/Plan: Gastrointestinal bleeding  -Transfer from CIR for acute drop in hemoglobin.  Melanotic stools noted in the hospital. -Underwent EGD 3 times this admission because of persistent drop in hemoglobin and melanotic stool-11/23, 11/27 and 11/30.  Noted to have Candida esophagitis, grade D esophagitis and nonbleeding gastric ulcers as well as duodenal ulcers.  No active bleeding identified in an EGD. -Currently being monitored for further bleeding.  Patient complains of melanotic stools this morning without blood.  Hemoglobin roughly stable. -If signs of ongoing further bleeding, will get a nuclear bleeding scan or colonoscopy. -Currently on clear liquid diet. -Continue Protonix drip.  Candida esophagitis -Per EGD from 11/30.  Discussed with Dr. Watt Climes.  Started on nystatin swish and swallow  Acute blood loss anemia -From GI  bleeding. -Received a total of 15 units of PRBC so far this admission. -Currently aspirin and Plavix are on hold. -Hemoglobin 8.2 this morning, roughly stable. -Continue to monitor hemoglobin. Recent Labs    02/25/20 1928  02/25/20 1928 02/26/20 0504 02/26/20 0504 02/26/20 1300 02/26/20 2330 02/27/20 0517  HGB 7.1*  --  8.3*  --  9.2* 8.0* 8.2*  MCV 88.0   < > 89.1   < > 89.3  --  90.2   < > = values in this interval not displayed.   s/p left hip fracture -s/p repair on 11/14 -continue PT/OT -Now too deconditioned for CIR.  Likely needs SNF  Acute respiratory failure with hypoxia -Considered due to atelectasis -Working well with spirometry -Continue low-flow oxygen.  Wean down as tolerated.  Pulmonary emboli -Incidental finding on CTA abdomen on 02/23/2020. PE is located in left lower lung, but CTA chest would be needed to fully evaluate.  Due to recently recovering renal function, as well as coexisting active bleeding, patient is not a candidate for anticoagulation. -DVT scan negative as well.  History of CVA -subacute CVA/ small SDH on previous admit, -Aspirin and Plavix on hold.  UTI -11/27, complaint of pain with urination.  Urinalysis with positive leukocytes, few bacteria.  Started on 3-5-day course of IV Rocephin.  foley in place.  Acute renal failure superimposed on stage 4 chronic kidney disease ([place]) -renal function slowly improving -daily BMP Recent Labs    02/18/20 1820 02/19/20 0322 02/20/20 0406 02/21/20 0205 02/22/20 0523 02/23/20 0332 02/24/20 0506 02/25/20 0700 02/26/20 0504 02/27/20 0517  BUN 128* 124* 82* 55* 35* 33* 39* 39* 32* 20  CREATININE 2.53* 2.44* 1.95* 1.61* 1.42* 1.36* 1.36* 1.32* 1.31* 1.31*   Pulmonary nodule - LLL 36mm, consider f/u scan in 12 mo  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 (HCC) - K7Q 8.8% on 11/12 -Continue linagliptin, SSI and CBG monitoring Recent Labs  Lab 02/26/20 0739 02/26/20 1119 02/26/20 1632 02/26/20 2117 02/27/20 0601  GLUCAP 111* 107* 131* 114* 113*   Chronic bilateral lower extremity lymphedema -Lasix on hold.  IV fluid is stopped yesterday.  Mobility: PT eval obtained.  SNF recommended Code  Status:   Code Status: Full Code  Nutritional status: Body mass index is 32.4 kg/m.     Diet Order            Diet clear liquid Room service appropriate? Yes; Fluid consistency: Thin  Diet effective now                 DVT prophylaxis: Place and maintain sequential compression device Start: 02/17/20 1150 SCDs Start: 02/17/20 1120 Place TED hose Start: 02/17/20 1120   Antimicrobials:  Nystatin swish and swallow Fluid: Not on IV fluid Consultants: GI Family Communication:  Daughter not at bedside today.  Status is: Inpatient  Remains inpatient appropriate because: Need to monitor hemoglobin  Dispo: The patient is from: CIR              Anticipated d/c is to: SNF              Anticipated d/c date is: 2 to 3 days              Patient currently is not medically stable to d/c.    Infusions:  . sodium chloride 250 mL (02/26/20 1400)  . cefTRIAXone (ROCEPHIN)  IV 1 g (02/26/20 1401)  . pantoprozole (PROTONIX) infusion 8 mg/hr (02/27/20 0943)    Scheduled Meds: . sodium  chloride   Intravenous Once  . acetaminophen  650 mg Oral Once  . Cerefolin NAC  1 tablet Oral q AM  . Chlorhexidine Gluconate Cloth  6 each Topical Daily  . exemestane  25 mg Oral QPC breakfast  . febuxostat  40 mg Oral Daily  . feeding supplement (GLUCERNA SHAKE)  237 mL Oral TID WC  . furosemide  20 mg Intravenous Once  . Gerhardt's butt cream  1 application Topical TID  . insulin aspart  0-9 Units Subcutaneous TID AC & HS  . linagliptin  5 mg Oral Daily  . mirtazapine  15 mg Oral QHS  . multivitamin with minerals  1 tablet Oral Daily  . nystatin  5 mL Oral QID  . rosuvastatin  20 mg Oral Daily  . sodium chloride flush  10-40 mL Intracatheter Q12H    Antimicrobials: Anti-infectives (From admission, onward)   Start     Dose/Rate Route Frequency Ordered Stop   02/24/20 1400  cefTRIAXone (ROCEPHIN) 1 g in sodium chloride 0.9 % 100 mL IVPB        1 g 200 mL/hr over 30 Minutes Intravenous Every 24  hours 02/24/20 1311        PRN meds: acetaminophen, alum & mag hydroxide-simeth, bisacodyl, guaiFENesin-dextromethorphan, hydrocortisone, lidocaine, menthol-cetylpyridinium **OR** phenol, methocarbamol, nitroGLYCERIN, ondansetron (ZOFRAN) IV, polyethylene glycol, prochlorperazine **OR** [DISCONTINUED] prochlorperazine **OR** [DISCONTINUED] prochlorperazine, senna-docusate, sodium chloride flush, sodium phosphate, traZODone, witch hazel-glycerin   Objective: Vitals:   02/27/20 0733 02/27/20 0836  BP: (!) 157/93 (!) 157/93  Pulse: 97 97  Resp: (!) 28 (!) 28  Temp: 97.8 F (36.6 C) 97.8 F (36.6 C)  SpO2: 99%     Intake/Output Summary (Last 24 hours) at 02/27/2020 1023 Last data filed at 02/27/2020 0911 Gross per 24 hour  Intake 460 ml  Output 1950 ml  Net -1490 ml   Filed Weights   02/26/20 0007 02/26/20 0822 02/27/20 0012  Weight: 99.3 kg 99.3 kg 96.7 kg   Weight change: 0 kg Body mass index is 32.4 kg/m.   Physical Exam: General exam: Not in physical distress Skin: No rashes, lesions or ulcers. HEENT: Atraumatic, normocephalic, no obvious bleeding Lungs: Clear to auscultation bilaterally CVS: Regular rate and rhythm, no murmur GI/Abd soft, nontender, nondistended, bowel present CNS: Alert, awake, oriented x3 Psychiatry: Mood appropriate Extremities: Chronic lymphedema bilateral 2+.  Data Review: I have personally reviewed the laboratory data and studies available.  Recent Labs  Lab 02/25/20 0700 02/25/20 0700 02/25/20 1928 02/26/20 0504 02/26/20 1300 02/26/20 2330 02/27/20 0517  WBC 16.1*  --  13.3* 12.7* 12.8*  --  9.3  NEUTROABS  --   --   --   --   --   --  7.3  HGB 7.0*   < > 7.1* 8.3* 9.2* 8.0* 8.2*  HCT 21.8*   < > 22.1* 26.2* 29.2* 25.8* 26.8*  MCV 88.3  --  88.0 89.1 89.3  --  90.2  PLT 274  --  252 269 298  --  286   < > = values in this interval not displayed.   Recent Labs  Lab 02/21/20 0205 02/21/20 0205 02/22/20 0523 02/22/20 0523  02/23/20 0332 02/24/20 0506 02/25/20 0700 02/26/20 0504 02/27/20 0517  NA 139   < > 141   < > 139 140 140 139 140  K 3.4*   < > 3.6   < > 4.0 3.8 3.8 3.6 3.7  CL 107   < > 106   < >  106 107 106 105 106  CO2 23   < > 24   < > 25 25 25 25 24   GLUCOSE 117*   < > 115*   < > 131* 117* 128* 128* 128*  BUN 55*   < > 35*   < > 33* 39* 39* 32* 20  CREATININE 1.61*   < > 1.42*   < > 1.36* 1.36* 1.32* 1.31* 1.31*  CALCIUM 8.0*   < > 8.2*   < > 7.3* 7.6* 7.7* 8.0* 8.3*  MG 2.4   < > 2.5*   < > 2.0 2.1 1.8 2.1 2.1  PHOS 2.4*  --  2.4*  --  2.9  --   --   --   --    < > = values in this interval not displayed.    F/u labs ordered  Signed, Terrilee Croak, MD [facility] 02/27/2020

## 2020-02-27 NOTE — Progress Notes (Signed)
O2 stats dropping to high 80's placed on 2L nasal cannula O2 97%. Will continue to monitor. Arthor Captain LPN

## 2020-02-27 NOTE — Plan of Care (Signed)

## 2020-02-27 NOTE — Care Management Important Message (Signed)
Important Message  Patient Details  Name: Halle Davlin MRN: [id number] Date of Birth: 07/01/1940   Medicare Important Message Given:  Yes     Shelda Altes 02/27/2020, 12:02 PM

## 2020-02-27 NOTE — Progress Notes (Signed)
   02/27/20 0733  Assess: MEWS Score  Temp 97.8 F (36.6 C)  BP (!) 157/93  Pulse Rate 97  ECG Heart Rate 96  Resp (!) 28  Level of Consciousness Alert  SpO2 99 %  O2 Device Nasal Cannula  Patient Activity (if Appropriate) In bed  O2 Flow Rate (L/min) 3 L/min  Assess: MEWS Score  MEWS Temp 0  MEWS Systolic 0  MEWS Pulse 0  MEWS RR 2  MEWS LOC 0  MEWS Score 2  MEWS Score Color Yellow  Assess: if the MEWS score is Yellow or Red  Were vital signs taken at a resting state? Yes  Focused Assessment No change from prior assessment  Early Detection of Sepsis Score *See Row Information* Low  MEWS guidelines implemented *See Row Information* Yes  Treat  MEWS Interventions Administered prn meds/treatments  Pain Scale 0-10  Pain Score 0  Take Vital Signs  Increase Vital Sign Frequency  Yellow: Q 2hr X 2 then Q 4hr X 2, if remains yellow, continue Q 4hrs  Escalate  MEWS: Escalate Yellow: discuss with charge nurse/RN and consider discussing with provider and RRT  Notify: Charge Nurse/RN  Name of Charge Nurse/RN Notified  (patient is yellow mews)  Date Charge Nurse/RN Notified 02/27/20  Time Charge Nurse/RN Notified 0906  Notify: Provider  Provider Name/Title Dr.Dahal  Date Provider Notified 02/27/20  Time Provider Notified [phone]  Notification Type Page  Notification Reason Other (Comment) (yellow mews score)  Response No new orders (o2 applied and resp are better now.)  Date of Provider Response 02/27/20  Time of Provider Response [phone]  Notify: Rapid Response  Name of Rapid Response RN Notified  (not this time.)  Document  Patient Outcome  (shortness of breath relieved with o2 application.)  Progress note created (see row info) Yes  Patient slightly short of breath with wean trial off o2 her bp became elevated as well as resp elevated o2 was applied at 3 LPM/[place] and this relieved her. No further changes noted.

## 2020-02-28 ENCOUNTER — Inpatient Hospital Stay (HOSPITAL_BASED_OUTPATIENT_CLINIC_OR_DEPARTMENT_OTHER): Payer: Medicare Other

## 2020-02-28 DIAGNOSIS — M79609 Pain in unspecified limb: Secondary | ICD-10-CM | POA: Diagnosis not present

## 2020-02-28 DIAGNOSIS — M7989 Other specified soft tissue disorders: Secondary | ICD-10-CM | POA: Diagnosis not present

## 2020-02-28 LAB — BASIC METABOLIC PANEL
Anion gap: 7 (ref 5–15)
BUN: 11 mg/dL (ref 8–23)
CO2: 26 mmol/L (ref 22–32)
Calcium: 8.3 mg/dL — ABNORMAL LOW (ref 8.9–10.3)
Chloride: 107 mmol/L (ref 98–111)
Creatinine, Ser: 1.21 mg/dL — ABNORMAL HIGH (ref 0.44–1.00)
GFR, Estimated: 45 mL/min — ABNORMAL LOW (ref >60)
Glucose, Bld: 137 mg/dL — ABNORMAL HIGH (ref 70–99)
Potassium: 3.7 mmol/L (ref 3.5–5.1)
Sodium: 140 mmol/L (ref 135–145)

## 2020-02-28 LAB — CBC WITH DIFFERENTIAL/PLATELET
Abs Immature Granulocytes: 0.05 10*3/uL (ref 0.00–0.07)
Basophils Absolute: 0.1 10*3/uL (ref 0.0–0.1)
Basophils Relative: 1 %
Eosinophils Absolute: 0.2 10*3/uL (ref 0.0–0.5)
Eosinophils Relative: 2 %
HCT: 26.2 % — ABNORMAL LOW (ref 36.0–46.0)
Hemoglobin: 7.9 g/dL — ABNORMAL LOW (ref 12.0–15.0)
Immature Granulocytes: 1 %
Lymphocytes Relative: 7 %
Lymphs Abs: 0.7 10*3/uL (ref 0.7–4.0)
MCH: 27.9 pg (ref 26.0–34.0)
MCHC: 30.2 g/dL (ref 30.0–36.0)
MCV: 92.6 fL (ref 80.0–100.0)
Monocytes Absolute: 0.6 10*3/uL (ref 0.1–1.0)
Monocytes Relative: 6 %
Neutro Abs: 7.9 10*3/uL — ABNORMAL HIGH (ref 1.7–7.7)
Neutrophils Relative %: 83 %
Platelets: 281 10*3/uL (ref 150–400)
RBC: 2.83 MIL/uL — ABNORMAL LOW (ref 3.87–5.11)
RDW: 16.3 % — ABNORMAL HIGH (ref 11.5–15.5)
WBC: 9.5 10*3/uL (ref 4.0–10.5)
nRBC: 0 % (ref 0.0–0.2)

## 2020-02-28 LAB — GLUCOSE, CAPILLARY
Glucose-Capillary: 145 mg/dL — ABNORMAL HIGH (ref 70–99)
Glucose-Capillary: 131 mg/dL — ABNORMAL HIGH (ref 70–99)
Glucose-Capillary: 136 mg/dL — ABNORMAL HIGH (ref 70–99)
Glucose-Capillary: 152 mg/dL — ABNORMAL HIGH (ref 70–99)

## 2020-02-28 LAB — MAGNESIUM: Magnesium: 2 mg/dL (ref 1.7–2.4)

## 2020-02-28 MED ORDER — BUMETANIDE 2 MG PO TABS
2.0000 mg | ORAL_TABLET | Freq: Every day | ORAL | Status: DC
  Administered 2020-02-28 – 2020-03-02 (×4): 2 mg via ORAL
  Filled 2020-02-28 (×4): qty 1

## 2020-02-28 NOTE — Plan of Care (Signed)

## 2020-02-28 NOTE — Progress Notes (Signed)
Assumed care of patient at 1900.  Patient AAOx4.  Complaining of increased pain and discomfort in R arm overnight.  Has +2 pulses bilaterally, however arm does have increased swelling.  Paged MD.  New order for ulatrasound to r/o DVT.

## 2020-02-28 NOTE — Progress Notes (Signed)
PROGRESS NOTE  [name]  DOB: December 24, 1940  PCP: [name], DO ENI:[id number]  DOA: 02/17/2020  LOS: 11 days   Chief complaint -weakness -  Brief narrative: [name] is an 79 year old female notable PMH for CAD s/p stent, CKD stage IV, DMT2, HFpEF, breast cancer and bilateral lower extremity edema.  She presented to the ED on 11/21 with Previously hospitalized 11/12 to 11/19 for left hip fracture s/p hemiarthroplasty 11/14 and was incidentally found to have subacute CVA and small SDH.  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 No role for surgical intervention per Neurosurgery. She was started on ASA/ plavix for stroke prevention.   11/19, she was discharged to CIR.    On 11/20, while at CIR, she complained of epigastric and abdominal discomfort with ongoing constipation. KUB showed non-obstructive gas pattered with stool present in rectum.  Overnight, she developed hypotension with ongoing abdominal and epigastric pain, nausea, SOB, and with one episode of reported emesis, unclear if bloody. Post emesis, staff report she had a wet cough with audible rales, requiring 3L [place].  Hgb 5.4, down from 8.2 on 11/20. Blood pressure was low in the 70s. She was transferred to ICU service. She received 2 units PRBC.  11/23, patient underwent EGD.  Noted to have grade D esophagitis, likely source of GI bleeding. Her hemoglobin as well as vitals stabilized.  11/24, she was transferred out of the ICU.  11/26, patient had melanotic stools and further drop in hemoglobin from 8-6.1.  She also became hypotensive and tachycardic.  She received 3 more  units of PRBCs. 11/27, patient underwent repeat EGD and was found to have cratered nonbleeding gastric ulcers and 2 large nonbleeding cratered duodenal ulcers with a large friable vessel in the duodenal bulb.  Intervention on the vessel was not performed because of high risk status.  IR evaluation was obtained for possible embolization.. IR did not recommend embolization due to the absence of active bleeding.   Patient however continued to bleed. 11/30, third EGD was obtained which showed esophageal candidiasis on top of the above-mentioned findings.  Subjective: Patient was seen and examined this morning.   Very pleasant, soft-spoken elderly Caucasian female.  Has not had a bowel movement since yesterday morning.  However there is a drop in hemoglobin again this morning. Had a long conversation with her husband at bedside who is understandably frustrated with patient's long hospitalization and no source of bleeding identified yet.  Assessment/Plan: Gastrointestinal bleeding  -Transfer from CIR for acute drop in hemoglobin.  Melanotic stools noted in the hospital. -Underwent EGD 3 times this admission because of persistent drop in hemoglobin and melanotic stool-11/23, 11/27 and 11/30.  Noted to have Candida esophagitis, grade D esophagitis and nonbleeding gastric ulcers as well as duodenal ulcers.  No active bleeding identified in an EGD. -Despite that, patient continues to have melenic stool in continuously dropping hemoglobin.  Hemoglobin down to 7.9 this morning from 8.2 yesterday. -Continue to monitor.  Remains on Protonix 40 mg IV twice daily.  On full liquid diet. -GI following as well.  If hemoglobin continues to drop, may need red blood cell bleeding scan.  Candida esophagitis -Per  EGD from 11/30.  Discussed with Dr. Watt Climes.  Started on nystatin swish and swallow  Acute blood loss anemia -From GI bleeding. -Received a total of 15 units of PRBC so far this admission. -Currently aspirin  and Plavix are on hold. -Hemoglobin 7.9 this morning, roughly stable. -Continue to monitor hemoglobin.  Transfuse if less than 7. Recent Labs    02/26/20 0504 02/26/20 0504 02/26/20 1300 02/26/20 1300 02/26/20 2330 02/27/20 0517 02/28/20 0437  HGB 8.3*  --  9.2*  --  8.0* 8.2* 7.9*  MCV 89.1   < > 89.3   < >  --  90.2 92.6   < > = values in this interval not displayed.   left hip fracture -s/p repair on 11/14 -continue PT/OT -Patient is now too deconditioned for CIR. needs SNF.  Acute respiratory failure with hypoxia -Considered due to atelectasis -Working well with spirometry -Continue low-flow oxygen.  Wean down as tolerated.  Pulmonary emboli -Incidental finding on CTA abdomen on 02/23/2020.  Pulmonary embolism is located in left lower lung, but CTA chest would be needed to fully evaluate.  Due to recently recovering renal function, as well as coexisting active bleeding, patient is not a candidate for anticoagulation. -DVT scan negative as well.  Right cephalic vein superficial thrombosis -Patient has right upper extremity swelling.  Ultrasound duplex did not show any DVT but showed right cephalic vein superficial thrombophlebitis.  History of CVA -subacute CVA/ small SDH on previous admit, -Aspirin and Plavix on hold.  UTI -11/27, complaint of pain with urination.  Urinalysis with positive leukocytes, few bacteria.  Completed 5-day course of IV Rocephin.  Urinary symptoms resolved. Purewick catheter in place  AKI on CKD 4 Chronic bilateral lower extremity lymphedema -Baseline creatinine less than 1.7.  During this hospitalization, creatinine worsened to peak at 2.53.  Gradually improved with IV fluid.   -Patient has chronic bilateral lower extreme lymphedema and was taking Bumex 2 mg daily at home.  Currently she has used bilateral pedal edema, worse than her baseline. -Creatinine is back to baseline.  I would resume Bumex 2 mg daily today -Continue to monitor renal  function and pedal edema. Recent Labs    02/19/20 0322 02/20/20 0406 02/21/20 0205 02/22/20 0523 02/23/20 0332 02/24/20 0506 02/25/20 0700 02/26/20 0504 02/27/20 0517 02/28/20 0437  BUN 124* 82* 55* 35* 33* 39* 39* 32* 20 11  CREATININE 2.44* 1.95* 1.61* 1.42* 1.36* 1.36* 1.32* 1.31* 1.31* 1.21*   Uncontrolled type 2 diabetes mellitus - A1c 8.8% on 11/12 -Continue linagliptin, SSI and CBG monitoring Recent Labs  Lab 02/27/20 1624 02/27/20 2126 02/28/20 0605 02/28/20 1145 02/28/20 1623  GLUCAP 153* 166* 145* 131* 136*   Pulmonary nodule - LLL 66mm, consider f/u scan in 12 mo   Mobility: PT eval obtained.  SNF recommended Code Status:   Code Status: Full Code  Nutritional status: Body mass index is 32.85 kg/m.     Diet Order            Diet full liquid Room service appropriate? Yes; Fluid consistency: Thin  Diet effective now                 DVT prophylaxis: Place and maintain sequential compression device Start: 02/17/20 1150 SCDs Start: 02/17/20 1120 Place TED hose Start: 02/17/20 1120   Antimicrobials:  Nystatin swish and swallow Fluid: Not on IV fluid Consultants: GI Family Communication:  Daughter not at bedside today.  Status is: Inpatient  Remains inpatient appropriate because: Need to  monitor hemoglobin  Dispo: The patient is from: CIR              Anticipated d/c is to: SNF              Anticipated d/c date is: 2 to 3 days              Patient currently is not medically stable to d/c.    Infusions:  . sodium chloride 250 mL (02/26/20 1400)  . cefTRIAXone (ROCEPHIN)  IV 1 g (02/28/20 1609)    Scheduled Meds: . sodium chloride   Intravenous Once  . acetaminophen  650 mg Oral Once  . bumetanide  2 mg Oral Daily  . Cerefolin NAC  1 tablet Oral q AM  . Chlorhexidine Gluconate Cloth  6 each Topical Daily  . exemestane  25 mg Oral QPC breakfast  . febuxostat  40 mg Oral Daily  . feeding supplement (GLUCERNA SHAKE)  237 mL Oral TID WC    . furosemide  20 mg Intravenous Once  . Gerhardt's butt cream  1 application Topical TID  . insulin aspart  0-9 Units Subcutaneous TID AC & HS  . linagliptin  5 mg Oral Daily  . mirtazapine  15 mg Oral QHS  . multivitamin with minerals  1 tablet Oral Daily  . nystatin  5 mL Oral QID  . pantoprazole (PROTONIX) IV  40 mg Intravenous Q12H  . rosuvastatin  20 mg Oral Daily  . sodium chloride flush  10-40 mL Intracatheter Q12H    Antimicrobials: Anti-infectives (From admission, onward)   Start     Dose/Rate Route Frequency Ordered Stop   02/24/20 1400  cefTRIAXone (ROCEPHIN) 1 g in sodium chloride 0.9 % 100 mL IVPB        1 g 200 mL/hr over 30 Minutes Intravenous Every 24 hours 02/24/20 1311 02/29/20 1359      PRN meds: acetaminophen, alum & mag hydroxide-simeth, bisacodyl, guaiFENesin-dextromethorphan, hydrocortisone, lidocaine, menthol-cetylpyridinium **OR** phenol, methocarbamol, nitroGLYCERIN, ondansetron (ZOFRAN) IV, polyethylene glycol, prochlorperazine **OR** [DISCONTINUED] prochlorperazine **OR** [DISCONTINUED] prochlorperazine, senna-docusate, sodium chloride flush, sodium phosphate, traZODone, witch hazel-glycerin   Objective: Vitals:   02/28/20 1142 02/28/20 1621  BP:    Pulse:    Resp:    Temp: 98.3 F (36.8 C) 98.5 F (36.9 C)  SpO2:      Intake/Output Summary (Last 24 hours) at 02/28/2020 1628 Last data filed at 02/28/2020 1458 Gross per 24 hour  Intake 1350 ml  Output 3301 ml  Net -1951 ml   Filed Weights   02/27/20 1120 02/28/20 0100 02/28/20 1120  Weight: 99.4 kg 98.1 kg 98 kg   Weight change: 0.1 kg Body mass index is 32.85 kg/m.   Physical Exam: General exam: Not in physical distress.  On low-flow oxygen by nasal cannula Skin: No rashes, lesions or ulcers. HEENT: Atraumatic, normocephalic, no obvious bleeding Lungs: Clear to auscultation bilaterally CVS: Regular rate and rhythm, no murmur GI/Abd soft, nontender, nondistended, bowel present CNS:  Alert, awake, oriented x3 Psychiatry: Mood appropriate Extremities: 2-3+ bilateral lower extremity lymphedema  Data Review: I have personally reviewed the laboratory data and studies available.  Recent Labs  Lab 02/25/20 1928 02/25/20 1928 02/26/20 0504 02/26/20 1300 02/26/20 2330 02/27/20 0517 02/28/20 0437  WBC 13.3*  --  12.7* 12.8*  --  9.3 9.5  NEUTROABS  --   --   --   --   --  7.3 7.9*  HGB 7.1*   < > 8.3* 9.2* 8.0*  8.2* 7.9*  HCT 22.1*   < > 26.2* 29.2* 25.8* 26.8* 26.2*  MCV 88.0  --  89.1 89.3  --  90.2 92.6  PLT 252  --  269 298  --  286 281   < > = values in this interval not displayed.   Recent Labs  Lab 02/22/20 0523 02/22/20 0523 02/23/20 0332 02/23/20 0332 02/24/20 0506 02/25/20 0700 02/26/20 0504 02/27/20 0517 02/28/20 0437  NA 141   < > 139   < > 140 140 139 140 140  K 3.6   < > 4.0   < > 3.8 3.8 3.6 3.7 3.7  CL 106   < > 106   < > 107 106 105 106 107  CO2 24   < > 25   < > 25 25 25 24 26   GLUCOSE 115*   < > 131*   < > 117* 128* 128* 128* 137*  BUN 35*   < > 33*   < > 39* 39* 32* 20 11  CREATININE 1.42*   < > 1.36*   < > 1.36* 1.32* 1.31* 1.31* 1.21*  CALCIUM 8.2*   < > 7.3*   < > 7.6* 7.7* 8.0* 8.3* 8.3*  MG 2.5*   < > 2.0   < > 2.1 1.8 2.1 2.1 2.0  PHOS 2.4*  --  2.9  --   --   --   --   --   --    < > = values in this interval not displayed.    F/u labs ordered  Signed, Terrilee Croak, MD [facility] 02/28/2020

## 2020-02-28 NOTE — Progress Notes (Signed)
   02/28/20 1400  Assess: MEWS Score  BP (!) 152/72  Pulse Rate 92  ECG Heart Rate 91  Resp (!) 28  SpO2 98 %  Assess: MEWS Score  MEWS Temp 0  MEWS Systolic 0  MEWS Pulse 0  MEWS RR 2  MEWS LOC 0  MEWS Score 2  MEWS Score Color Yellow  Assess: if the MEWS score is Yellow or Red  Were vital signs taken at a resting state? Yes  Focused Assessment No change from prior assessment  Early Detection of Sepsis Score *See Row Information* Low  MEWS guidelines implemented *See Row Information* No, vital signs rechecked  Treat  MEWS Interventions Other (Comment) (no further interventions this time.)  Pain Scale 0-10  Pain Score 0  Take Vital Signs  Increase Vital Sign Frequency  Yellow: Q 2hr X 2 then Q 4hr X 2, if remains yellow, continue Q 4hrs  Escalate  MEWS: Escalate Yellow: discuss with charge nurse/RN and consider discussing with provider and RRT  Notify: Charge Nurse/RN  Name of Charge Nurse/RN Notified  (not this time)  Notify: Provider  Provider Name/Title  (MD aware. Dr.Dahal)  Notify: Rapid Response  Name of Rapid Response RN Notified  (not this time)

## 2020-02-28 NOTE — Anesthesia Postprocedure Evaluation (Signed)
Anesthesia Post Note  Patient: [name]  Procedure(s) Performed: ESOPHAGOGASTRODUODENOSCOPY (EGD) WITH PROPOFOL (Left )     Patient location during evaluation: Endoscopy Anesthesia Type: MAC Level of consciousness: awake and alert Pain management: pain level controlled Vital Signs Assessment: post-procedure vital signs reviewed and stable Respiratory status: spontaneous breathing, nonlabored ventilation, respiratory function stable and patient connected to nasal cannula oxygen Cardiovascular status: stable and blood pressure returned to baseline Postop Assessment: no apparent nausea or vomiting Anesthetic complications: no   No complications documented.  Last Vitals:  Vitals:   02/28/20 0318 02/28/20 1142  BP:    Pulse: 97   Resp: 18   Temp: 36.8 C 36.8 C  SpO2: 96%     Last Pain:  Vitals:   02/28/20 1142  TempSrc: Oral  PainSc:                  Trask Vosler

## 2020-02-28 NOTE — Progress Notes (Signed)
Inpatient Rehabilitation-Admissions Coordinator   Was asked by [facility] team to contact the pt's family to discuss why the patient no longer qualifies for CIR. Discussed current therapy recommendations, pt's inability to tolerate intense rehab, and the need for a longer rehab stay. Pt's husband verbalized understanding. He is open to SNF placement.   Please call if questions.   Raechel Ache, OTR/L  Rehab Admissions Coordinator  [phone] 02/28/2020 1:09 PM

## 2020-02-28 NOTE — Progress Notes (Signed)
Upper extremity venous RT study completed.  Preliminary results relayed to Dahal, MD.   See CV Proc for preliminary results report.   Darlin Coco, RDMS

## 2020-02-28 NOTE — Anesthesia Preprocedure Evaluation (Signed)
Anesthesia Evaluation  Patient identified by MRN, date of birth, ID band Patient awake    Reviewed: Allergy & Precautions, NPO status , Patient's Chart, lab work & pertinent test results  History of Anesthesia Complications Negative for: history of anesthetic complications  Airway Mallampati: II  TM Distance: >3 FB Neck ROM: Full    Dental  (+) Dental Advisory Given   Pulmonary neg pulmonary ROS,  Covid-19 Nucleic Acid Test Results Lab Results      Component                Value               Date                      SARSCOV2NAA              NEGATIVE            02/08/2020                White Signal              NEGATIVE            10/04/2018              breath sounds clear to auscultation       Cardiovascular hypertension, Pt. on medications + CAD and +CHF   Rhythm:Regular  1. Left ventricular ejection fraction, by estimation, is 60 to 65%. The  left ventricle has normal function. The left ventricle has no regional  wall motion abnormalities. There is mild left ventricular hypertrophy.  Left ventricular diastolic parameters  are consistent with Grade I diastolic dysfunction (impaired relaxation).  2. Right ventricular systolic function is normal. The right ventricular  size is normal. There is normal pulmonary artery systolic pressure. The  estimated right ventricular systolic pressure is 16.1 mmHg.  3. The mitral valve is normal in structure. Trivial mitral valve  regurgitation. No evidence of mitral stenosis.  4. The aortic valve is tricuspid. Aortic valve regurgitation is not  visualized. No aortic stenosis is present.  5. The inferior vena cava is normal in size with <50% respiratory  variability, suggesting right atrial pressure of 8 mmHg.    Neuro/Psych  Headaches, PSYCHIATRIC DISORDERS Anxiety TIA Neuromuscular disease CVA    GI/Hepatic GERD  ,? GI bleed   Endo/Other  diabetes  Renal/GU CRFRenal  diseaseLab Results      Component                Value               Date                      CREATININE               1.21 (H)            02/28/2020                Musculoskeletal  (+) Arthritis ,   Abdominal   Peds  Hematology  (+) Blood dyscrasia, anemia , Lab Results      Component                Value               Date                      WBC  9.5                 02/28/2020                HGB                      7.9 (L)             02/28/2020                HCT                      26.2 (L)            02/28/2020                MCV                      92.6                02/28/2020                PLT                      281                 02/28/2020              Anesthesia Other Findings   Reproductive/Obstetrics                             Anesthesia Physical Anesthesia Plan  ASA: III  Anesthesia Plan: MAC   Post-op Pain Management:    Induction: Intravenous  PONV Risk Score and Plan: 2 and Propofol infusion and Treatment may vary due to age or medical condition  Airway Management Planned: Nasal Cannula  Additional Equipment: None  Intra-op Plan:   Post-operative Plan:   Informed Consent: I have reviewed the patients History and Physical, chart, labs and discussed the procedure including the risks, benefits and alternatives for the proposed anesthesia with the patient or authorized representative who has indicated his/her understanding and acceptance.     Dental advisory given  Plan Discussed with: CRNA and Anesthesiologist  Anesthesia Plan Comments:         Anesthesia Quick Evaluation

## 2020-02-28 NOTE — Progress Notes (Signed)
°   02/28/20 0800  Vitals  BP (!) 141/71  MAP (mmHg) 90  Pulse Rate (!) 108  ECG Heart Rate (!) 109  Resp (!) 30  Level of Consciousness  Level of Consciousness Alert  MEWS COLOR  MEWS Score Color Yellow  Oxygen Therapy  SpO2 98 %  Pain Assessment  Pain Scale 0-10  Pain Score 0  Complaints & Interventions  Constipation interventions Stool Softener;Laxative  PCA/Epidural/Spinal Assessment  Respiratory Pattern Regular;Unlabored  Glasgow Coma Scale  Eye Opening 4  Best Verbal Response (NON-intubated) 5  Best Motor Response 6  Glasgow Coma Scale Score 15  MEWS Score  MEWS Temp 0  MEWS Systolic 0  MEWS Pulse 1  MEWS RR 2  MEWS LOC 0  MEWS Score 3  Provider Notification  Provider Name/Title Dr.Bahal  Date Provider Notified 02/28/20  Time Provider Notified 0830  Notification Type Rounds  Notification Reason Other (Comment) (chronic yellow mews)  Response No new orders  Rapid Response Notification  Name of Rapid Response RN Notified  (not this time.)  Note  Observations  (chronic yellow mews MD aware no new orders noted.)

## 2020-02-28 NOTE — TOC Progression Note (Signed)
Transition of Care [facility]) - Progression Note    Patient Details  Name: Jizelle Conkey MRN: [id number] Date of Birth: 12/30/1940  [facility] CM/SW [place] Phone Number: 02/28/2020, 4:42 PM  Clinical Narrative:    CSW reached out to pt's daughter [name] in reference to whether a SNF choice had been made. [name] states that she planned on going by [facility] when she got off work but received a phone call from pt's spouse saying MD and director of CIR (a female) came in the room and stated pt would be reevaluated for admission into CIR tomorrow. [name] states she would like pt to go to CIR and would hold off on choosing a SNF at this point. There are no notes to support this, only a note from [place] (admissions Warehouse manager) stating a conversation with pt's spouse of why pt was no longer a candidate for CIR. CSW reached out to CIR to try and clarify but was told that Claiborne Billings would have to be consulted. CSW reached out to attending MD to see if he was the MD in the room with the pt's spouse and CIR director but MD states he hasn't spoken with pt or spouse since this am. CSW will check with [employer].          Expected Discharge Plan and Services                                                 Social Determinants of Health (SDOH) Interventions    Readmission Risk Interventions No flowsheet data found.

## 2020-02-28 NOTE — Progress Notes (Signed)
[name] Neighbors [place] 9:00 AM  Subjective: Patient without a bowel movement yet today still dark yesterday no new complaints except her swollen arm which we discussed  Objective: Vital signs stable afebrile no acute distress abdomen is soft nontender hemoglobin stable BUN okay  Assessment: Peptic ulcer disease  Plan: We will check on tomorrow please call if concerns over active bleeding but consider nuclear bleeding scan next  [facility]  office [phone] After 5PM or if no answer call [phone]

## 2020-02-29 LAB — GLUCOSE, CAPILLARY
Glucose-Capillary: 128 mg/dL — ABNORMAL HIGH (ref 70–99)
Glucose-Capillary: 116 mg/dL — ABNORMAL HIGH (ref 70–99)
Glucose-Capillary: 133 mg/dL — ABNORMAL HIGH (ref 70–99)
Glucose-Capillary: 145 mg/dL — ABNORMAL HIGH (ref 70–99)

## 2020-02-29 LAB — BPAM RBC
Blood Product Expiration Date: 202112292359
Blood Product Expiration Date: 202112292359
Blood Product Expiration Date: 202112292359
ISSUE DATE / TIME: 202111300048
Unit Type and Rh: 6200
Unit Type and Rh: 6200
Unit Type and Rh: 6200

## 2020-02-29 LAB — TYPE AND SCREEN
ABO/RH(D): A POS
Antibody Screen: NEGATIVE
Donor AG Type: NEGATIVE
Donor AG Type: NEGATIVE
Donor AG Type: NEGATIVE
Unit division: 0
Unit division: 0
Unit division: 0

## 2020-02-29 LAB — CBC WITH DIFFERENTIAL/PLATELET
Abs Immature Granulocytes: 0.04 10*3/uL (ref 0.00–0.07)
Basophils Absolute: 0 10*3/uL (ref 0.0–0.1)
Basophils Relative: 0 %
Eosinophils Absolute: 0.3 10*3/uL (ref 0.0–0.5)
Eosinophils Relative: 4 %
HCT: 27.4 % — ABNORMAL LOW (ref 36.0–46.0)
Hemoglobin: 8.2 g/dL — ABNORMAL LOW (ref 12.0–15.0)
Immature Granulocytes: 1 %
Lymphocytes Relative: 10 %
Lymphs Abs: 0.9 10*3/uL (ref 0.7–4.0)
MCH: 27.2 pg (ref 26.0–34.0)
MCHC: 29.9 g/dL — ABNORMAL LOW (ref 30.0–36.0)
MCV: 91 fL (ref 80.0–100.0)
Monocytes Absolute: 0.7 10*3/uL (ref 0.1–1.0)
Monocytes Relative: 8 %
Neutro Abs: 6.5 10*3/uL (ref 1.7–7.7)
Neutrophils Relative %: 77 %
Platelets: 308 10*3/uL (ref 150–400)
RBC: 3.01 MIL/uL — ABNORMAL LOW (ref 3.87–5.11)
RDW: 16 % — ABNORMAL HIGH (ref 11.5–15.5)
WBC: 8.4 10*3/uL (ref 4.0–10.5)
nRBC: 0 % (ref 0.0–0.2)

## 2020-02-29 LAB — BASIC METABOLIC PANEL
Anion gap: 9 (ref 5–15)
BUN: 9 mg/dL (ref 8–23)
CO2: 27 mmol/L (ref 22–32)
Calcium: 8.5 mg/dL — ABNORMAL LOW (ref 8.9–10.3)
Chloride: 105 mmol/L (ref 98–111)
Creatinine, Ser: 1.22 mg/dL — ABNORMAL HIGH (ref 0.44–1.00)
GFR, Estimated: 45 mL/min — ABNORMAL LOW (ref >60)
Glucose, Bld: 125 mg/dL — ABNORMAL HIGH (ref 70–99)
Potassium: 3.5 mmol/L (ref 3.5–5.1)
Sodium: 141 mmol/L (ref 135–145)

## 2020-02-29 NOTE — Progress Notes (Signed)
PROGRESS NOTE  [name]  DOB: 01-06-1941  PCP: Kristen Moro, DO IOE:[id number]  DOA: 02/17/2020  LOS: 12 days   Chief complaint -weakness -  Brief narrative: [name] is an 79 year old female notable PMH for CAD s/p stent, CKD stage IV, DMT2, HFpEF, breast cancer and bilateral lower extremity edema.  She presented to the ED on 11/21 with Previously hospitalized 11/12 to 11/19 for left hip fracture s/p hemiarthroplasty 11/14 and was incidentally found to have subacute CVA and small SDH.  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 No role for surgical intervention per Neurosurgery. She was started on ASA/ plavix for stroke prevention.   11/19, she was discharged to CIR.    On 11/20, while at CIR, she complained of epigastric and abdominal discomfort with ongoing constipation. KUB showed non-obstructive gas pattered with stool present in rectum.  Overnight, she developed hypotension with ongoing abdominal and epigastric pain, nausea, SOB, and with one episode of reported emesis, unclear if bloody. Post emesis, staff report she had a wet cough with audible rales, requiring 3L [place].  Hgb 5.4, down from 8.2 on 11/20. Blood pressure was low in the 70s. She was transferred to ICU service. She received 2 units PRBC.  11/23, patient underwent EGD.  Noted to have grade D esophagitis, likely source of GI bleeding. Her hemoglobin as well as vitals stabilized.  11/24, she was transferred out of the ICU.  11/26, patient had melanotic stools and further drop in hemoglobin from 8-6.1.  She also became hypotensive and tachycardic.  She received 3 more  units of PRBCs. 11/27, patient underwent repeat EGD and was found to have cratered nonbleeding gastric ulcers and 2 large nonbleeding cratered duodenal ulcers with a large friable vessel in the duodenal bulb.  Intervention on the vessel was not performed because of high risk status.  IR evaluation was obtained for possible embolization.. IR did not recommend embolization due to the absence of active bleeding.   Patient however continued to bleed. 11/30, third EGD was obtained which showed esophageal candidiasis on top of the above-mentioned findings.  Subjective: Patient was seen and examined this morning.   Sitting up in chair.  Not in distress.  No bowel movement in last 2 days. Hemoglobin did not drop today after several days.  Assessment/Plan: Gastrointestinal bleeding  -Transfer from CIR for acute drop in hemoglobin.  Melanotic stools noted in the hospital. -Underwent EGD 3 times this admission because of persistent drop in hemoglobin and melanotic stool-11/23, 11/27 and 11/30.  Noted to have Candida esophagitis, grade D esophagitis and nonbleeding gastric ulcers as well as duodenal ulcers.  No active bleeding identified in an EGD. -Despite that, patient continued to have melenic stool in continuously dropping hemoglobin.   -Being monitored for the same. -Remains on Protonix 40 mg IV twice daily.  On full liquid diet. -GI following as well.  If hemoglobin continues to drop, may need red blood cell bleeding scan.  Candida esophagitis -Per EGD from 11/30.  Discussed with [name].  Started on nystatin swish and swallow  Acute blood loss anemia -From GI bleeding. -Received a total of 15 units of PRBC so far this  admission. -Currently aspirin and Plavix are on hold. -Hemoglobin 8.2 today.  Improved from 7.9 yesterday.  -Continue to monitor hemoglobin.  Transfuse if less than 7. Recent Labs    02/26/20 1300 02/26/20 1300 02/26/20 2330 02/27/20 0517 02/27/20 0517 02/28/20 0437  02/29/20 0335  HGB 9.2*  --  8.0* 8.2*  --  7.9* 8.2*  MCV 89.3   < >  --  90.2   < > 92.6 91.0   < > = values in this interval not displayed.   left hip fracture -s/p repair on 11/14 -continue PT/OT -Patient is now too deconditioned for CIR. needs SNF.  Acute respiratory failure with hypoxia -Considered due to atelectasis -Working well with spirometry -Continue low-flow oxygen.  Wean down as tolerated.  Pulmonary emboli -Incidental finding on CTA abdomen on 02/23/2020.  Pulmonary embolism is located in left lower lung, but CTA chest would be needed to fully evaluate.  Due to recently recovering renal function, as well as coexisting active bleeding, patient is not a candidate for anticoagulation. -DVT scan negative as well.  Right cephalic vein superficial thrombosis -Patient has right upper extremity swelling.  Ultrasound duplex did not show any DVT but showed right cephalic vein superficial thrombophlebitis.  History of CVA -subacute CVA/ small SDH on previous admit, -Aspirin and Plavix on hold.  UTI -11/27, complaint of pain with urination.  Urinalysis with positive leukocytes, few bacteria.  Completed 5-day course of IV Rocephin.  Urinary symptoms resolved. Purewick catheter in place  AKI on CKD 4 Chronic bilateral lower extremity lymphedema -Baseline creatinine less than 1.7.  During this hospitalization, creatinine worsened to peak at 2.53.  Gradually improved with IV fluid.   -Patient has chronic bilateral lower extreme lymphedema and was taking Bumex 2 mg daily at home.  Currently she has used bilateral pedal edema, worse than her baseline. -12/2, I started the patient back on Bumex 2 mg daily today -Continue to monitor renal function and pedal edema. Recent Labs    02/20/20 0406 02/21/20 0205 02/22/20 0523 02/23/20 0332 02/24/20 0506 02/25/20 0700 02/26/20 0504 02/27/20 0517 02/28/20 0437 02/29/20 0335  BUN 82* 55* 35* 33* 39* 39* 32* 20 11 9   CREATININE  1.95* 1.61* 1.42* 1.36* 1.36* 1.32* 1.31* 1.31* 1.21* 1.22*   Uncontrolled type 2 diabetes mellitus - A1c 8.8% on 11/12 -Continue linagliptin, SSI and CBG monitoring Recent Labs  Lab 02/28/20 1145 02/28/20 1623 02/28/20 2136 02/29/20 0624 02/29/20 1147  GLUCAP 131* 136* 152* 128* 116*   Pulmonary nodule - LLL 57mm, consider f/u scan in 12 months.   Mobility: PT eval obtained.  SNF recommended Code Status:   Code Status: Full Code  Nutritional status: Body mass index is 31.61 kg/m.     Diet Order            Diet full liquid Room service appropriate? Yes; Fluid consistency: Thin  Diet effective now                 DVT prophylaxis: Place and maintain sequential compression device Start: 02/17/20 1150 SCDs Start: 02/17/20 1120 Place TED hose Start: 02/17/20 1120   Antimicrobials:  Nystatin swish and swallow Fluid: Not on IV fluid Consultants: GI Family Communication:  Family not at bedside today.  Status is: Inpatient  Remains inpatient appropriate because: Need to monitor hemoglobin  Dispo: The patient is from: CIR              Anticipated d/c is to: SNF  Anticipated d/c date is: 2 to 3 days              Patient currently is not medically stable to d/c.    Infusions:  . sodium chloride Stopped (02/27/20 1655)    Scheduled Meds: . sodium chloride   Intravenous Once  . acetaminophen  650 mg Oral Once  . bumetanide  2 mg Oral Daily  . Cerefolin NAC  1 tablet Oral q AM  . Chlorhexidine Gluconate Cloth  6 each Topical Daily  . exemestane  25 mg Oral QPC breakfast  . febuxostat  40 mg Oral Daily  . feeding supplement (GLUCERNA SHAKE)  237 mL Oral TID WC  . furosemide  20 mg Intravenous Once  . Gerhardt's butt cream  1 application Topical TID  . insulin aspart  0-9 Units Subcutaneous TID AC & HS  . linagliptin  5 mg Oral Daily  . mirtazapine  15 mg Oral QHS  . multivitamin with minerals  1 tablet Oral Daily  . nystatin  5 mL Oral QID  .  pantoprazole (PROTONIX) IV  40 mg Intravenous Q12H  . rosuvastatin  20 mg Oral Daily  . sodium chloride flush  10-40 mL Intracatheter Q12H    Antimicrobials: Anti-infectives (From admission, onward)   Start     Dose/Rate Route Frequency Ordered Stop   02/24/20 1400  cefTRIAXone (ROCEPHIN) 1 g in sodium chloride 0.9 % 100 mL IVPB        1 g 200 mL/hr over 30 Minutes Intravenous Every 24 hours 02/24/20 1311 02/28/20 1639      PRN meds: acetaminophen, alum & mag hydroxide-simeth, bisacodyl, guaiFENesin-dextromethorphan, hydrocortisone, lidocaine, menthol-cetylpyridinium **OR** phenol, methocarbamol, nitroGLYCERIN, ondansetron (ZOFRAN) IV, polyethylene glycol, prochlorperazine **OR** [DISCONTINUED] prochlorperazine **OR** [DISCONTINUED] prochlorperazine, senna-docusate, sodium chloride flush, sodium phosphate, traZODone, witch hazel-glycerin   Objective: Vitals:   02/29/20 0500 02/29/20 0756  BP: (!) 149/78   Pulse: 87   Resp: (!) 29   Temp:  97.9 F (36.6 C)  SpO2: 96%     Intake/Output Summary (Last 24 hours) at 02/29/2020 1254 Last data filed at 02/29/2020 1044 Gross per 24 hour  Intake 1540 ml  Output 5201 ml  Net -3661 ml   Filed Weights   02/28/20 0100 02/28/20 1120 02/29/20 0500  Weight: 98.1 kg 98 kg 94.3 kg   Weight change: -1.4 kg Body mass index is 31.61 kg/m.   Physical Exam: General exam: Not in physical distress.  On low-flow oxygen by nasal cannula Skin: No rashes, lesions or ulcers. HEENT: Atraumatic, normocephalic, no obvious bleeding Lungs: Clear to auscultation bilaterally CVS: Regular rate and rhythm, no murmur GI/Abd soft, nontender, nondistended, bowel present CNS: Alert, awake, oriented x3 Psychiatry: Mood appropriate Extremities: 2-3+ bilateral lower extremity lymphedema  Data Review: I have personally reviewed the laboratory data and studies available.  Recent Labs  Lab 02/26/20 0504 02/26/20 0504 02/26/20 1300 02/26/20 2330  02/27/20 0517 02/28/20 0437 02/29/20 0335  WBC 12.7*  --  12.8*  --  9.3 9.5 8.4  NEUTROABS  --   --   --   --  7.3 7.9* 6.5  HGB 8.3*   < > 9.2* 8.0* 8.2* 7.9* 8.2*  HCT 26.2*   < > 29.2* 25.8* 26.8* 26.2* 27.4*  MCV 89.1  --  89.3  --  90.2 92.6 91.0  PLT 269  --  298  --  286 281 308   < > = values in this interval not displayed.   Recent Labs  Lab 02/23/20 0332 02/23/20 0332 02/24/20 0506 02/24/20 0506 02/25/20 0700 02/26/20 0504 02/27/20 0517 02/28/20 0437 02/29/20 0335  NA 139   < > 140   < > 140 139 140 140 141  K 4.0   < > 3.8   < > 3.8 3.6 3.7 3.7 3.5  CL 106   < > 107   < > 106 105 106 107 105  CO2 25   < > 25   < > 25 25 24 26 27   GLUCOSE 131*   < > 117*   < > 128* 128* 128* 137* 125*  BUN 33*   < > 39*   < > 39* 32* 20 11 9   CREATININE 1.36*   < > 1.36*   < > 1.32* 1.31* 1.31* 1.21* 1.22*  CALCIUM 7.3*   < > 7.6*   < > 7.7* 8.0* 8.3* 8.3* 8.5*  MG 2.0   < > 2.1  --  1.8 2.1 2.1 2.0  --   PHOS 2.9  --   --   --   --   --   --   --   --    < > = values in this interval not displayed.    F/u labs ordered  Signed, Terrilee Croak, MD [facility] 02/29/2020

## 2020-02-29 NOTE — Progress Notes (Signed)
Mariya Fabio Neighbors [place] 10:11 AM  Subjective: Patient doing well and is about to do occupational therapy and is looking forward to getting out of the bed and has no GI complaints and no signs of bleeding  Objective: Vital signs stable afebrile no acute distress hemoglobin stable patient not examined today looks well  Assessment: Peptic ulcer disease  Plan: Twice daily pump inhibitors for 1 month and then can decrease to once a day and care with aspirin and nonsteroidals going forward and I am happy to see back in follow-up and please call rounding partners this weekend if any question or problem we can help with  [facility]  office [phone] After 5PM or if no answer call [phone]

## 2020-02-29 NOTE — TOC Progression Note (Signed)
Transition of Care [facility]) - Progression Note    Patient Details  Name: [name] MRN: [id number] Date of Birth: 1940-09-06  [facility] CM/SW [place], [place] Phone Number: 02/29/2020, 1:05 PM  Clinical Narrative:    CSW spoke with pt's daughter [name], stated they are ok with pt going to SNF as the patient needs longer rehab. Requesting [facility]. CSW waiting on response from [facility] to see if they can take pt.         Expected Discharge Plan and Services                                                 Social Determinants of Health (SDOH) Interventions    Readmission Risk Interventions No flowsheet data found.

## 2020-02-29 NOTE — Plan of Care (Signed)
  Problem: Education: Goal: Knowledge of General Education information will improve Description: Including pain rating scale, medication(s)/side effects and non-pharmacologic comfort measures Outcome: Progressing   Problem: Health Behavior/Discharge Planning: Goal: Ability to manage health-related needs will improve Outcome: Progressing   Problem: Clinical Measurements: Goal: Ability to maintain clinical measurements within normal limits will improve Outcome: Progressing Goal: Will remain free from infection Outcome: Progressing Goal: Diagnostic test results will improve Outcome: Progressing Goal: Respiratory complications will improve Outcome: Progressing   Problem: Activity: Goal: Risk for activity intolerance will decrease Outcome: Progressing   Problem: Nutrition: Goal: Adequate nutrition will be maintained Outcome: Progressing   Problem: Coping: Goal: Level of anxiety will decrease Outcome: Progressing   Problem: Pain Managment: Goal: General experience of comfort will improve Outcome: Progressing

## 2020-02-29 NOTE — Plan of Care (Signed)

## 2020-02-29 NOTE — Progress Notes (Signed)
Physical Therapy Treatment Patient Details Name: [name] MRN: [id number] DOB: 1941-02-17 Today's Date: 02/29/2020    History of Present Illness 79 y.o. female admitted back to the hospital from inpatient rehab on 02/17/20 secondary to acute blood loss anemia and hemodynamic instability.  Dx with hemorrhagic shock secondary to UGIB (esophagitis likely cause), hypoxemia related to atelectasis, mild AKI.  Pt with recent PMH of subacute CVA/ small subdural hematoma, L hip fx s/p repair on 02/10/20 (WBAT with posterior precautions), peripheral neuropathy, gout, HTN, dementia/mild cognitive impairment, DM, CAD, CKD, Charcot's Bil feet, BreastCA bil s/p bil lumpectomy, R ankle fusion 01/2018.     PT Comments    Pt was seen for progression of mobiltiy and was just back to bed from the chair.  Declined more mobility but agreed to bed ex.  Pt is moving LLE with less assist, knows 2/3 precautions for hip.  Follow up with her to increase safety and mobility on LLE for the support of standing and more independence with gait.  Follow as goals of acute therapy are written.   Follow Up Recommendations  SNF     Equipment Recommendations  3in1 (PT);Wheelchair (measurements PT);Wheelchair cushion (measurements PT);Hospital bed    Recommendations for Other Services       Precautions / Restrictions Precautions Precautions: Posterior Hip;Fall Precaution Booklet Issued: Yes (comment) Precaution Comments: reviewed hip precautions Required Braces or Orthoses: Other Brace Other Brace: charcot support braces Restrictions Weight Bearing Restrictions: Yes LLE Weight Bearing: Weight bearing as tolerated Other Position/Activity Restrictions: posterior hip precautions    Mobility  Bed Mobility Overal bed mobility: Needs Assistance             General bed mobility comments: in bed and declined to get up  Transfers                 General transfer comment: declined  Ambulation/Gait                  Stairs             Wheelchair Mobility    Modified Rankin (Stroke Patients Only)       Balance                                            Cognition Arousal/Alertness: Awake/alert Behavior During Therapy: WFL for tasks assessed/performed Overall Cognitive Status: Within Functional Limits for tasks assessed Area of Impairment: Awareness;Problem solving;Following commands                   Current Attention Level: Selective Memory: Decreased short-term memory;Decreased recall of precautions Following Commands: Follows one step commands inconsistently;Follows one step commands with increased time Safety/Judgement: Decreased awareness of safety;Decreased awareness of deficits Awareness: Intellectual Problem Solving: Slow processing;Requires verbal cues;Requires tactile cues        Exercises General Exercises - Lower Extremity Ankle Circles/Pumps: AAROM;5 reps;Both Heel Slides: AAROM;10 reps Hip ABduction/ADduction: AROM;AAROM;10 reps Straight Leg Raises: AAROM;10 reps Hip Flexion/Marching: AROM;10 reps;Right    General Comments        Pertinent Vitals/Pain Pain Assessment: Faces Faces Pain Scale: Hurts little more Pain Location: hips and back Pain Descriptors / Indicators: Guarding Pain Intervention(s): Limited activity within patient's tolerance;Monitored during session;Premedicated before session;Repositioned    Home Living  Prior Function            PT Goals (current goals can now be found in the care plan section) Acute Rehab PT Goals Patient Stated Goal: get to walk again    Frequency    Min 3X/week      PT Plan Current plan remains appropriate    Co-evaluation              AM-PAC PT "6 Clicks" Mobility   Outcome Measure  Help needed turning from your back to your side while in a flat bed without using bedrails?: A Lot Help needed moving from lying on  your back to sitting on the side of a flat bed without using bedrails?: A Lot Help needed moving to and from a bed to a chair (including a wheelchair)?: Total Help needed standing up from a chair using your arms (e.g., wheelchair or bedside chair)?: Total Help needed to walk in hospital room?: Total Help needed climbing 3-5 steps with a railing? : Total 6 Click Score: 8    End of Session Equipment Utilized During Treatment: Oxygen Activity Tolerance: Patient tolerated treatment well;Patient limited by fatigue Patient left: in bed;with call bell/phone within reach;with bed alarm set Nurse Communication: Mobility status PT Visit Diagnosis: Unsteadiness on feet (R26.81);Other abnormalities of gait and mobility (R26.89);Muscle weakness (generalized) (M62.81);Difficulty in walking, not elsewhere classified (R26.2) Pain - Right/Left: Left Pain - part of body: Hip     Time: 4497-5300 PT Time Calculation (min) (ACUTE ONLY): 25 min  Charges:  $Therapeutic Exercise: 23-37 mins              Ramond Dial 02/29/2020, 5:23 PM  Mee Hives, PT MS Acute Rehab Dept. Number: [place]

## 2020-02-29 NOTE — Progress Notes (Signed)
Occupational Therapy Treatment Patient Details Name: [name] MRN: [id number] DOB: Sep 09, 1940 Today's Date: 02/29/2020    History of present illness 79 y.o. female admitted back to the hospital from inpatient rehab on 02/17/20 secondary to acute blood loss anemia and hemodynamic instability.  Dx with hemorrhagic shock secondary to UGIB (esophagitis likely cause), hypoxemia related to atelectasis, mild AKI.  Pt with recent PMH of subacute CVA/ small subdural hematoma, L hip fx s/p repair on 02/10/20 (WBAT with posterior precautions), peripheral neuropathy, gout, HTN, dementia/mild cognitive impairment, DM, CAD, CKD, Charcot's Bil feet, BreastCA bil s/p bil lumpectomy, R ankle fusion 01/2018.    OT comments  Patient continues to present with the deficits noted below.  She subjectively states she feels better, and is ready to start moving.  She was able to complete bed mobility with up to Mod A, sit to stand and stand pivot with Min A from an elevated surface.  Mild dizziness noted: BP 135/88, HR to 101 and O2 on 2 L 97%.  She completed sitting grooming with supervision.  Nice improvement from noted prior sessions.  She is preparing for SNF discharge per MD.  OT will continue to follow in the acute setting.  Nursing notified O2 dropped from 3 to 2 L and sats of 97%.    Follow Up Recommendations  SNF;Supervision/Assistance - 24 hour    Equipment Recommendations  3 in 1 bedside commode;Wheelchair (measurements OT);Wheelchair cushion (measurements OT)    Recommendations for Other Services      Precautions / Restrictions Precautions Precautions: Posterior Hip;Fall Precaution Booklet Issued: Yes (comment) Precaution Comments: reviewed hip precautions Restrictions Weight Bearing Restrictions: Yes LLE Weight Bearing: Weight bearing as tolerated Other Position/Activity Restrictions: posterior hip precautions       Mobility Bed Mobility   Bed Mobility: Rolling;Sidelying to Sit Rolling:  Mod assist Sidelying to sit: Mod assist          Transfers Overall transfer level: Needs assistance Equipment used: Rolling walker (2 wheeled) Transfers: Sit to/from [facility] Sit to Stand: Min assist;From elevated surface Stand pivot transfers: Min assist            Balance           Standing balance support: Bilateral upper extremity supported;During functional activity Standing balance-Leahy Scale: Poor Standing balance comment: requires UE and external support                            ADL either performed or assessed with clinical judgement   ADL       Grooming: Sitting;Wash/dry hands;Wash/dry face;Supervision/safety   Upper Body Bathing: Minimal assistance;Sitting   Lower Body Bathing: Maximal assistance;Sitting/lateral leans   Upper Body Dressing : Minimal assistance;Sitting   Lower Body Dressing: Maximal assistance;Sit to/from stand Lower Body Dressing Details (indicate cue type and reason): able to stand up to 30 sec for simulated pant mgt and peri care                                       Cognition Arousal/Alertness: Awake/alert Behavior During Therapy: [facility] for tasks assessed/performed Overall Cognitive Status: Within Functional Limits for tasks assessed  Exercises     Shoulder Instructions       General Comments      Pertinent Vitals/ Pain       Faces Pain Scale: Hurts little more Pain Location: patient 's bottom - ointment applied Pain Descriptors / Indicators: Grimacing Pain Intervention(s): Monitored during session                                                          Frequency  Min 2X/week        Progress Toward Goals  OT Goals(current goals can now be found in the care plan section)  Progress towards OT goals: Progressing toward goals  Acute Rehab OT Goals Patient Stated Goal: to get  stronger and go home OT Goal Formulation: With patient Time For Goal Achievement: 03/03/20 Potential to Achieve Goals: Good  Plan Discharge plan remains appropriate    Co-evaluation                 AM-PAC OT "6 Clicks" Daily Activity     Outcome Measure   Help from another person eating meals?: None Help from another person taking care of personal grooming?: A Little Help from another person toileting, which includes using toliet, bedpan, or urinal?: A Lot Help from another person bathing (including washing, rinsing, drying)?: A Lot Help from another person to put on and taking off regular upper body clothing?: A Little Help from another person to put on and taking off regular lower body clothing?: A Lot 6 Click Score: 16    End of Session Equipment Utilized During Treatment: Oxygen;Gait belt;Rolling walker  OT Visit Diagnosis: Unsteadiness on feet (R26.81);Other abnormalities of gait and mobility (R26.89);Pain;Muscle weakness (generalized) (M62.81)   Activity Tolerance Patient tolerated treatment well   Patient Left in chair;with call bell/phone within reach;with nursing/sitter in room   Nurse Communication Mobility status        Time: 4270-6237 OT Time Calculation (min): 28 min  Charges: OT General Charges $OT Visit: 1 Visit OT Treatments $Self Care/Home Management : 23-37 mins  02/29/2020  Rich, OTR/L  Acute Rehabilitation Services  Office:  [phone]    Metta Clines 02/29/2020, 10:45 AM

## 2020-03-01 LAB — CBC WITH DIFFERENTIAL/PLATELET
Abs Immature Granulocytes: 0.04 10*3/uL (ref 0.00–0.07)
Basophils Absolute: 0 10*3/uL (ref 0.0–0.1)
Basophils Relative: 0 %
Eosinophils Absolute: 0.1 10*3/uL (ref 0.0–0.5)
Eosinophils Relative: 2 %
HCT: 28.7 % — ABNORMAL LOW (ref 36.0–46.0)
Hemoglobin: 8.9 g/dL — ABNORMAL LOW (ref 12.0–15.0)
Immature Granulocytes: 0 %
Lymphocytes Relative: 8 %
Lymphs Abs: 0.7 10*3/uL (ref 0.7–4.0)
MCH: 27.8 pg (ref 26.0–34.0)
MCHC: 31 g/dL (ref 30.0–36.0)
MCV: 89.7 fL (ref 80.0–100.0)
Monocytes Absolute: 0.6 10*3/uL (ref 0.1–1.0)
Monocytes Relative: 6 %
Neutro Abs: 7.7 10*3/uL (ref 1.7–7.7)
Neutrophils Relative %: 84 %
Platelets: 321 10*3/uL (ref 150–400)
RBC: 3.2 MIL/uL — ABNORMAL LOW (ref 3.87–5.11)
RDW: 15.8 % — ABNORMAL HIGH (ref 11.5–15.5)
WBC: 9.2 10*3/uL (ref 4.0–10.5)
nRBC: 0 % (ref 0.0–0.2)

## 2020-03-01 LAB — GLUCOSE, CAPILLARY
Glucose-Capillary: 127 mg/dL — ABNORMAL HIGH (ref 70–99)
Glucose-Capillary: 142 mg/dL — ABNORMAL HIGH (ref 70–99)
Glucose-Capillary: 154 mg/dL — ABNORMAL HIGH (ref 70–99)
Glucose-Capillary: 112 mg/dL — ABNORMAL HIGH (ref 70–99)

## 2020-03-01 LAB — BASIC METABOLIC PANEL
Anion gap: 10 (ref 5–15)
BUN: 9 mg/dL (ref 8–23)
CO2: 29 mmol/L (ref 22–32)
Calcium: 8.8 mg/dL — ABNORMAL LOW (ref 8.9–10.3)
Chloride: 102 mmol/L (ref 98–111)
Creatinine, Ser: 1.09 mg/dL — ABNORMAL HIGH (ref 0.44–1.00)
GFR, Estimated: 51 mL/min — ABNORMAL LOW (ref >60)
Glucose, Bld: 137 mg/dL — ABNORMAL HIGH (ref 70–99)
Potassium: 3.3 mmol/L — ABNORMAL LOW (ref 3.5–5.1)
Sodium: 141 mmol/L (ref 135–145)

## 2020-03-01 MED ORDER — POTASSIUM CHLORIDE CRYS ER 20 MEQ PO TBCR
40.0000 meq | EXTENDED_RELEASE_TABLET | Freq: Once | ORAL | Status: AC
  Administered 2020-03-01: 40 meq via ORAL
  Filled 2020-03-01: qty 2

## 2020-03-01 NOTE — TOC Progression Note (Signed)
Transition of Care [facility]) - Progression Note    Patient Details  Name: [name] MRN: [id number] Date of Birth: [birth year]  Transition of Care [facility] CM/SW [place], [place] Phone Number: [phone] 03/01/2020, 3:34 PM  Clinical Narrative:     CSW spoke with patient's both daughters [name] and Levada Dy) about SNFs bed offers. CSW provided all of the offers. They inquired about a private room. They chose [facility] first choice and [facility] and second choice.  CSW followed up with Claiborne Billings at [facility] and confirmed that they will have a bed tomorrow and that it would be private. CSW inquired about a new covid test and one would be needed.  CSW updated MD about discharge plan and needing a new COVID test.  TOC team will continue to assist with discharge planning needs.      Expected Discharge Plan and Services                                                 Social Determinants of Health (SDOH) Interventions    Readmission Risk Interventions No flowsheet data found.

## 2020-03-01 NOTE — Progress Notes (Signed)
PROGRESS NOTE  [name]  DOB: 01-07-1941  PCP: Kristen Moro, DO JQB:[id number]  DOA: 02/17/2020  LOS: 13 days   Chief complaint -weakness -  Brief narrative: [name] is an 79 year old female notable PMH for CAD s/p stent, CKD stage IV, DMT2, HFpEF, breast cancer and bilateral lower extremity edema.  She presented to the ED on 11/21 with Previously hospitalized 11/12 to 11/19 for left hip fracture s/p hemiarthroplasty 11/14 and was incidentally found to have subacute CVA and small SDH.  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 No role for surgical intervention per Neurosurgery. She was started on ASA/ plavix for stroke prevention.   11/19, she was discharged to CIR.    On 11/20, while at CIR, she complained of epigastric and abdominal discomfort with ongoing constipation. KUB showed non-obstructive gas pattered with stool present in rectum.  Overnight, she developed hypotension with ongoing abdominal and epigastric pain, nausea, SOB, and with one episode of reported emesis, unclear if bloody. Post emesis, staff report she had a wet cough with audible rales, requiring 3L [place].  Hgb 5.4, down from 8.2 on 11/20. Blood pressure was low in the 70s. She was transferred to ICU service. She received 2 units PRBC.  11/23, patient underwent EGD.  Noted to have grade D esophagitis, likely source of GI bleeding. Her hemoglobin as well as vitals stabilized.  11/24, she was transferred out of the ICU.  11/26, patient had melanotic stools and further drop in hemoglobin from 8-6.1.  She also became hypotensive and tachycardic.  She received 3 more  units of PRBCs. 11/27, patient underwent repeat EGD and was found to have cratered nonbleeding gastric ulcers and 2 large nonbleeding cratered duodenal ulcers with a large friable vessel in the duodenal bulb.  Intervention on the vessel was not performed because of high risk status.  IR evaluation was obtained for possible embolization.. IR did not recommend embolization due to the absence of active bleeding.   Patient however continued to bleed. 11/30, third EGD was obtained which showed esophageal candidiasis on top of the above-mentioned findings.  Subjective: Patient was seen and examined this morning.   Sitting up in chair.  Not in distress.  No bowel movement in last 2 days. Hemoglobin did not drop today after several days.  Assessment/Plan: Gastrointestinal bleeding  -Transfer from CIR for acute drop in hemoglobin.  Melanotic stools noted in the hospital. -Underwent EGD 3 times this admission because of persistent drop in hemoglobin and melanotic stool-11/23, 11/27 and 11/30.  Noted to have Candida esophagitis, grade D esophagitis and nonbleeding gastric ulcers as well as duodenal ulcers.  No active bleeding identified in an EGD. -Despite that, for several days, patient continued to have melenic stool and had a steady drop in hemoglobin -Finally, today she has brown stool in her hemoglobin is stable. -Continue Protonix 40 mg IV twice daily.  Advance diet to soft.  Candida esophagitis -Per EGD from 11/30.  Discussed with Dr. Watt Climes.  Started on nystatin swish and swallow  Acute blood loss anemia -From GI bleeding. -Received a total of 15 units of PRBC so far this admission. -Currently aspirin and Plavix are on hold. -  Hemoglobin 8.9 today.  Improved from 8.2 yesterday. -Continue to monitor hemoglobin.  Transfuse if less than 7. Recent Labs    02/26/20 1300 02/26/20 2330 02/27/20 0517 02/27/20 0517 02/28/20 0437 02/28/20 0437 02/29/20 0335 03/01/20 0830  HGB  --  8.0* 8.2*   --  7.9*  --  8.2* 8.9*  MCV   < >  --  90.2   < > 92.6   < > 91.0 89.7   < > = values in this interval not displayed.   left hip fracture -s/p repair on 11/14 -continue PT/OT -Patient is now too deconditioned for CIR. needs SNF.  Acute respiratory failure with hypoxia -Considered due to atelectasis -Working well with spirometry -Continue low-flow oxygen.  Wean down as tolerated.  Pulmonary emboli -Incidental finding on CTA abdomen on 02/23/2020.  Pulmonary embolism is located in left lower lung, but CTA chest would be needed to fully evaluate.  Due to recently recovering renal function, as well as coexisting active bleeding, patient is not a candidate for anticoagulation. -DVT scan negative as well.  Right cephalic vein superficial thrombosis -Patient has right upper extremity swelling.  Ultrasound duplex did not show any DVT but showed right cephalic vein superficial thrombophlebitis.  History of CVA -subacute CVA/ small SDH on previous admit. -Aspirin and Plavix remain on hold.  UTI -11/27, complaint of pain with urination.  Urinalysis with positive leukocytes, few bacteria.  Completed 5-day course of IV Rocephin.  Urinary symptoms resolved. Purewick catheter in place  AKI on CKD 4 Chronic bilateral lower extremity lymphedema -Baseline creatinine less than 1.7.  During this hospitalization, creatinine worsened to peak at 2.53.  Gradually improved with IV fluid.   -Patient has chronic bilateral lower extreme lymphedema and was taking Bumex 2 mg daily at home.  Currently she has used bilateral pedal edema, worse than her baseline. -12/2, I started the patient back on Bumex 2 mg daily today.  Pedal edema and renal function improving.  Obtain albumin level tomorrow. -Continue to monitor renal function and pedal edema. Recent Labs    02/21/20 0205 02/22/20 0523 02/23/20 0332 02/24/20 0506 02/25/20 0700 02/26/20 0504 02/27/20 0517 02/28/20 0437 02/29/20 0335 03/01/20 0531   BUN 55* 35* 33* 39* 39* 32* 20 11 9 9   CREATININE 1.61* 1.42* 1.36* 1.36* 1.32* 1.31* 1.31* 1.21* 1.22* 1.09*   Uncontrolled type 2 diabetes mellitus - A1c 8.8% on 11/12 -Continue linagliptin, SSI and CBG monitoring Recent Labs  Lab 02/29/20 1147 02/29/20 1536 02/29/20 2043 03/01/20 0608 03/01/20 1109  GLUCAP 116* 133* 145* 127* 142*   Pulmonary nodule - LLL 87mm, consider f/u scan in 12 months.   Mobility: PT eval obtained.  SNF recommended Code Status:   Code Status: Full Code  Nutritional status: Body mass index is 30.97 kg/m.     Diet Order            Diet full liquid Room service appropriate? Yes; Fluid consistency: Thin  Diet effective now                 DVT prophylaxis: Place and maintain sequential compression device Start: 02/17/20 1150 SCDs Start: 02/17/20 1120 Place TED hose Start: 02/17/20 1120   Antimicrobials:  Nystatin swish and swallow Fluid: Not on IV fluid Consultants: GI Family Communication:  Updated patient's daughter Ms. [name] on the phone.  Status is: Inpatient  Dispo: The patient is from: CIR              Anticipated d/c is to:  SNF              Anticipated d/c date is: Discharge to SNF when bed available              Patient currently is medically stable to d/c.    Infusions:  . sodium chloride Stopped (02/27/20 1655)    Scheduled Meds: . sodium chloride   Intravenous Once  . acetaminophen  650 mg Oral Once  . bumetanide  2 mg Oral Daily  . Cerefolin NAC  1 tablet Oral q AM  . Chlorhexidine Gluconate Cloth  6 each Topical Daily  . exemestane  25 mg Oral QPC breakfast  . febuxostat  40 mg Oral Daily  . feeding supplement (GLUCERNA SHAKE)  237 mL Oral TID WC  . furosemide  20 mg Intravenous Once  . Gerhardt's butt cream  1 application Topical TID  . insulin aspart  0-9 Units Subcutaneous TID AC & HS  . linagliptin  5 mg Oral Daily  . mirtazapine  15 mg Oral QHS  . multivitamin with minerals  1 tablet Oral Daily  .  nystatin  5 mL Oral QID  . pantoprazole (PROTONIX) IV  40 mg Intravenous Q12H  . rosuvastatin  20 mg Oral Daily  . sodium chloride flush  10-40 mL Intracatheter Q12H    Antimicrobials: Anti-infectives (From admission, onward)   Start     Dose/Rate Route Frequency Ordered Stop   02/24/20 1400  cefTRIAXone (ROCEPHIN) 1 g in sodium chloride 0.9 % 100 mL IVPB        1 g 200 mL/hr over 30 Minutes Intravenous Every 24 hours 02/24/20 1311 02/28/20 1639      PRN meds: acetaminophen, alum & mag hydroxide-simeth, bisacodyl, guaiFENesin-dextromethorphan, hydrocortisone, lidocaine, menthol-cetylpyridinium **OR** phenol, methocarbamol, nitroGLYCERIN, ondansetron (ZOFRAN) IV, polyethylene glycol, prochlorperazine **OR** [DISCONTINUED] prochlorperazine **OR** [DISCONTINUED] prochlorperazine, senna-docusate, sodium chloride flush, sodium phosphate, traZODone, witch hazel-glycerin   Objective: Vitals:   03/01/20 0300 03/01/20 0836  BP: 128/63 (!) 151/73  Pulse: 85 98  Resp: 17 18  Temp: 97.6 F (36.4 C) 98.2 F (36.8 C)  SpO2: 99% 99%    Intake/Output Summary (Last 24 hours) at 03/01/2020 1409 Last data filed at 03/01/2020 1300 Gross per 24 hour  Intake 960 ml  Output 5450 ml  Net -4490 ml   Filed Weights   02/28/20 1120 02/29/20 0500 03/01/20 0300  Weight: 98 kg 94.3 kg 92.4 kg   Weight change: -5.6 kg Body mass index is 30.97 kg/m.   Physical Exam: General exam: Not in physical distress.  On low-flow oxygen by nasal cannula. Skin: No rashes, lesions or ulcers. HEENT: Atraumatic, normocephalic, no obvious bleeding Lungs: Clear to auscultation bilaterally CVS: Regular rate and rhythm, no murmur GI/Abd soft, nontender, nondistended, bowel sound present CNS: Alert, awake, oriented x3 Psychiatry: Mood appropriate Extremities: 2-3+ bilateral lower extremity lymphedema.  Mild skin wrinkles seen.  Data Review: I have personally reviewed the laboratory data and studies  available.  Recent Labs  Lab 02/26/20 1300 02/26/20 1300 02/26/20 2330 02/27/20 0517 02/28/20 0437 02/29/20 0335 03/01/20 0830  WBC 12.8*  --   --  9.3 9.5 8.4 9.2  NEUTROABS  --   --   --  7.3 7.9* 6.5 7.7  HGB 9.2*   < > 8.0* 8.2* 7.9* 8.2* 8.9*  HCT 29.2*   < > 25.8* 26.8* 26.2* 27.4* 28.7*  MCV 89.3  --   --  90.2 92.6 91.0 89.7  PLT 298  --   --  286 281 308 321   < > = values in this interval not displayed.   Recent Labs  Lab 02/24/20 0506 02/24/20 0506 02/25/20 0700 02/25/20 0700 02/26/20 0504 02/27/20 0517 02/28/20 0437 02/29/20 0335 03/01/20 0531  NA 140   < > 140   < > 139 140 140 141 141  K 3.8   < > 3.8   < > 3.6 3.7 3.7 3.5 3.3*  CL 107   < > 106   < > 105 106 107 105 102  CO2 25   < > 25   < > 25 24 26 27 29   GLUCOSE 117*   < > 128*   < > 128* 128* 137* 125* 137*  BUN 39*   < > 39*   < > 32* 20 11 9 9   CREATININE 1.36*   < > 1.32*   < > 1.31* 1.31* 1.21* 1.22* 1.09*  CALCIUM 7.6*   < > 7.7*   < > 8.0* 8.3* 8.3* 8.5* 8.8*  MG 2.1  --  1.8  --  2.1 2.1 2.0  --   --    < > = values in this interval not displayed.    F/u labs ordered  Signed, Terrilee Croak, MD [facility] 03/01/2020

## 2020-03-02 DIAGNOSIS — Z170 Estrogen receptor positive status [ER+]: Secondary | ICD-10-CM | POA: Diagnosis not present

## 2020-03-02 DIAGNOSIS — I2510 Atherosclerotic heart disease of native coronary artery without angina pectoris: Secondary | ICD-10-CM | POA: Diagnosis not present

## 2020-03-02 DIAGNOSIS — C50211 Malignant neoplasm of upper-inner quadrant of right female breast: Secondary | ICD-10-CM | POA: Diagnosis not present

## 2020-03-02 DIAGNOSIS — M6281 Muscle weakness (generalized): Secondary | ICD-10-CM | POA: Diagnosis not present

## 2020-03-02 DIAGNOSIS — D62 Acute posthemorrhagic anemia: Secondary | ICD-10-CM | POA: Diagnosis not present

## 2020-03-02 DIAGNOSIS — R339 Retention of urine, unspecified: Secondary | ICD-10-CM | POA: Diagnosis not present

## 2020-03-02 DIAGNOSIS — R062 Wheezing: Secondary | ICD-10-CM | POA: Diagnosis not present

## 2020-03-02 DIAGNOSIS — I2699 Other pulmonary embolism without acute cor pulmonale: Secondary | ICD-10-CM | POA: Diagnosis not present

## 2020-03-02 DIAGNOSIS — J209 Acute bronchitis, unspecified: Secondary | ICD-10-CM | POA: Diagnosis not present

## 2020-03-02 DIAGNOSIS — N184 Chronic kidney disease, stage 4 (severe): Secondary | ICD-10-CM | POA: Diagnosis not present

## 2020-03-02 DIAGNOSIS — I959 Hypotension, unspecified: Secondary | ICD-10-CM | POA: Diagnosis not present

## 2020-03-02 DIAGNOSIS — K264 Chronic or unspecified duodenal ulcer with hemorrhage: Secondary | ICD-10-CM | POA: Diagnosis not present

## 2020-03-02 DIAGNOSIS — R600 Localized edema: Secondary | ICD-10-CM | POA: Diagnosis not present

## 2020-03-02 DIAGNOSIS — E559 Vitamin D deficiency, unspecified: Secondary | ICD-10-CM | POA: Diagnosis not present

## 2020-03-02 DIAGNOSIS — R051 Acute cough: Secondary | ICD-10-CM | POA: Diagnosis not present

## 2020-03-02 DIAGNOSIS — R58 Hemorrhage, not elsewhere classified: Secondary | ICD-10-CM | POA: Diagnosis not present

## 2020-03-02 DIAGNOSIS — J40 Bronchitis, not specified as acute or chronic: Secondary | ICD-10-CM | POA: Diagnosis not present

## 2020-03-02 DIAGNOSIS — I5032 Chronic diastolic (congestive) heart failure: Secondary | ICD-10-CM | POA: Diagnosis not present

## 2020-03-02 DIAGNOSIS — K922 Gastrointestinal hemorrhage, unspecified: Secondary | ICD-10-CM | POA: Diagnosis not present

## 2020-03-02 DIAGNOSIS — M1A0720 Idiopathic chronic gout, left ankle and foot, without tophus (tophi): Secondary | ICD-10-CM | POA: Diagnosis not present

## 2020-03-02 DIAGNOSIS — C50412 Malignant neoplasm of upper-outer quadrant of left female breast: Secondary | ICD-10-CM | POA: Diagnosis not present

## 2020-03-02 DIAGNOSIS — K219 Gastro-esophageal reflux disease without esophagitis: Secondary | ICD-10-CM | POA: Diagnosis not present

## 2020-03-02 DIAGNOSIS — I6200 Nontraumatic subdural hemorrhage, unspecified: Secondary | ICD-10-CM | POA: Diagnosis not present

## 2020-03-02 DIAGNOSIS — N179 Acute kidney failure, unspecified: Secondary | ICD-10-CM | POA: Diagnosis not present

## 2020-03-02 DIAGNOSIS — E1165 Type 2 diabetes mellitus with hyperglycemia: Secondary | ICD-10-CM | POA: Diagnosis not present

## 2020-03-02 DIAGNOSIS — Z7401 Bed confinement status: Secondary | ICD-10-CM | POA: Diagnosis not present

## 2020-03-02 DIAGNOSIS — J9811 Atelectasis: Secondary | ICD-10-CM | POA: Diagnosis not present

## 2020-03-02 DIAGNOSIS — R0989 Other specified symptoms and signs involving the circulatory and respiratory systems: Secondary | ICD-10-CM | POA: Diagnosis not present

## 2020-03-02 DIAGNOSIS — R059 Cough, unspecified: Secondary | ICD-10-CM | POA: Diagnosis not present

## 2020-03-02 DIAGNOSIS — Z8781 Personal history of (healed) traumatic fracture: Secondary | ICD-10-CM | POA: Diagnosis not present

## 2020-03-02 DIAGNOSIS — G2581 Restless legs syndrome: Secondary | ICD-10-CM | POA: Diagnosis not present

## 2020-03-02 DIAGNOSIS — E1122 Type 2 diabetes mellitus with diabetic chronic kidney disease: Secondary | ICD-10-CM | POA: Diagnosis not present

## 2020-03-02 DIAGNOSIS — J9601 Acute respiratory failure with hypoxia: Secondary | ICD-10-CM | POA: Diagnosis not present

## 2020-03-02 DIAGNOSIS — E785 Hyperlipidemia, unspecified: Secondary | ICD-10-CM | POA: Diagnosis not present

## 2020-03-02 DIAGNOSIS — E1159 Type 2 diabetes mellitus with other circulatory complications: Secondary | ICD-10-CM | POA: Diagnosis not present

## 2020-03-02 DIAGNOSIS — B3781 Candidal esophagitis: Secondary | ICD-10-CM | POA: Diagnosis not present

## 2020-03-02 DIAGNOSIS — R5381 Other malaise: Secondary | ICD-10-CM | POA: Diagnosis not present

## 2020-03-02 DIAGNOSIS — M2550 Pain in unspecified joint: Secondary | ICD-10-CM | POA: Diagnosis not present

## 2020-03-02 DIAGNOSIS — R911 Solitary pulmonary nodule: Secondary | ICD-10-CM | POA: Diagnosis not present

## 2020-03-02 LAB — CBC WITH DIFFERENTIAL/PLATELET
Abs Immature Granulocytes: 0.04 10*3/uL (ref 0.00–0.07)
Basophils Absolute: 0.1 10*3/uL (ref 0.0–0.1)
Basophils Relative: 1 %
Eosinophils Absolute: 0.2 10*3/uL (ref 0.0–0.5)
Eosinophils Relative: 2 %
HCT: 28.1 % — ABNORMAL LOW (ref 36.0–46.0)
Hemoglobin: 8.5 g/dL — ABNORMAL LOW (ref 12.0–15.0)
Immature Granulocytes: 1 %
Lymphocytes Relative: 8 %
Lymphs Abs: 0.7 10*3/uL (ref 0.7–4.0)
MCH: 27.7 pg (ref 26.0–34.0)
MCHC: 30.2 g/dL (ref 30.0–36.0)
MCV: 91.5 fL (ref 80.0–100.0)
Monocytes Absolute: 0.6 10*3/uL (ref 0.1–1.0)
Monocytes Relative: 7 %
Neutro Abs: 7.3 10*3/uL (ref 1.7–7.7)
Neutrophils Relative %: 81 %
Platelets: 299 10*3/uL (ref 150–400)
RBC: 3.07 MIL/uL — ABNORMAL LOW (ref 3.87–5.11)
RDW: 15.9 % — ABNORMAL HIGH (ref 11.5–15.5)
WBC: 8.8 10*3/uL (ref 4.0–10.5)
nRBC: 0 % (ref 0.0–0.2)

## 2020-03-02 LAB — BASIC METABOLIC PANEL
Anion gap: 8 (ref 5–15)
BUN: 10 mg/dL (ref 8–23)
CO2: 29 mmol/L (ref 22–32)
Calcium: 8.5 mg/dL — ABNORMAL LOW (ref 8.9–10.3)
Chloride: 101 mmol/L (ref 98–111)
Creatinine, Ser: 1.22 mg/dL — ABNORMAL HIGH (ref 0.44–1.00)
GFR, Estimated: 45 mL/min — ABNORMAL LOW (ref >60)
Glucose, Bld: 212 mg/dL — ABNORMAL HIGH (ref 70–99)
Potassium: 3.8 mmol/L (ref 3.5–5.1)
Sodium: 138 mmol/L (ref 135–145)

## 2020-03-02 LAB — HEPATIC FUNCTION PANEL
ALT: 24 U/L (ref 0–44)
AST: 29 U/L (ref 15–41)
Albumin: 2 g/dL — ABNORMAL LOW (ref 3.5–5.0)
Alkaline Phosphatase: 89 U/L (ref 38–126)
Bilirubin, Direct: <0.1 mg/dL (ref 0.0–0.2)
Total Bilirubin: 0.4 mg/dL (ref 0.3–1.2)
Total Protein: 5.2 g/dL — ABNORMAL LOW (ref 6.5–8.1)

## 2020-03-02 LAB — RESP PANEL BY RT-PCR (FLU A&B, COVID) ARPGX2
Influenza A by PCR: NEGATIVE
Influenza B by PCR: NEGATIVE
SARS Coronavirus 2 by RT PCR: NEGATIVE

## 2020-03-02 LAB — GLUCOSE, CAPILLARY
Glucose-Capillary: 191 mg/dL — ABNORMAL HIGH (ref 70–99)
Glucose-Capillary: 127 mg/dL — ABNORMAL HIGH (ref 70–99)

## 2020-03-02 MED ORDER — ALUM & MAG HYDROXIDE-SIMETH 200-200-20 MG/5ML PO SUSP: 30.0000 mL | ORAL | 0 refills | Status: DC | PRN

## 2020-03-02 MED ORDER — GUAIFENESIN ER 600 MG PO TB12
600.0000 mg | ORAL_TABLET | Freq: Two times a day (BID) | ORAL | Status: DC
Start: 2020-03-02 — End: 2020-04-04

## 2020-03-02 MED ORDER — PANTOPRAZOLE SODIUM 40 MG PO TBEC: 40.0000 mg | DELAYED_RELEASE_TABLET | Freq: Two times a day (BID) | ORAL | Status: AC

## 2020-03-02 MED ORDER — INSULIN ASPART 100 UNIT/ML ~~LOC~~ SOLN
0.0000 [IU] | Freq: Three times a day (TID) | SUBCUTANEOUS | 11 refills | Status: DC
Start: 2020-03-02 — End: 2020-04-04

## 2020-03-02 MED ORDER — NYSTATIN 100000 UNIT/ML MT SUSP: 5.0000 mL | Freq: Four times a day (QID) | OROMUCOSAL | 0 refills | Status: AC

## 2020-03-02 MED ORDER — GLUCERNA SHAKE PO LIQD: 237.0000 mL | Freq: Three times a day (TID) | ORAL | 0 refills | Status: AC

## 2020-03-02 MED ORDER — HYDROCORTISONE ACETATE 25 MG RE SUPP: 25.0000 mg | Freq: Two times a day (BID) | RECTAL | 0 refills | Status: DC | PRN

## 2020-03-02 MED ORDER — GUAIFENESIN ER 600 MG PO TB12
600.0000 mg | ORAL_TABLET | Freq: Two times a day (BID) | ORAL | Status: DC
  Administered 2020-03-02: 600 mg via ORAL
  Filled 2020-03-02: qty 1

## 2020-03-02 MED ORDER — ADULT MULTIVITAMIN W/MINERALS CH: 1.0000 | ORAL_TABLET | Freq: Every day | ORAL | Status: AC

## 2020-03-02 NOTE — Discharge Summary (Addendum)
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  She presented to the ED on 11/21 with Previously hospitalized 11/12 to 11/19 for left hip fracture s/p hemiarthroplasty 11/14 and was incidentally found to have subacute CVA and small SDH.  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 No role for surgical intervention per Neurosurgery. She was started on ASA/ plavix for stroke prevention.   11/19, she was discharged to CIR.    On 11/20, while at CIR, she complained of epigastric and abdominal discomfort with ongoing constipation. KUB showed non-obstructive gas pattered with stool present in rectum.  Overnight, she developed hypotension with ongoing abdominal and epigastric pain, nausea, SOB, and with one episode of  reported emesis, unclear if bloody. Post emesis, staff report she had a wet cough with audible rales, requiring 3L [place].  Hgb 5.4, down from 8.2 on 11/20. Blood pressure was low in the 70s. She was transferred to ICU service. She received 2 units PRBC.  11/23, patient underwent EGD.  Noted to have grade D esophagitis, likely source of GI bleeding. Her hemoglobin as well as vitals stabilized.  11/24, she was transferred out of the ICU.  11/26, patient had melanotic stools and further drop in hemoglobin from 8-6.1.  She also became hypotensive and tachycardic.  She received 3 more units of PRBCs. 11/27, patient underwent repeat EGD and was found to have cratered nonbleeding gastric ulcers and 2 large nonbleeding cratered duodenal ulcers with a large friable vessel in the duodenal bulb.  Intervention on the vessel was not performed because of high risk status.  IR evaluation was obtained for possible embolization.. IR did not recommend embolization due to the absence of active bleeding.   Patient however continued to bleed. 11/30, third EGD was obtained which showed esophageal candidiasis on top of the above-mentioned findings. Over the next several days, patient was monitored for continued melanotic stool and drop in hemoglobin. Since 12/4, she has not had melanotic stool.  Hemoglobin is stable.  Subjective:  Seen and examined this morning.  Pleasant elderly Caucasian female.  Sitting up in chair.  Husband at bedside.  Patient had brown stool this morning without melena or blood.  Hospital Course:  Gastrointestinal bleeding  -Transferred from CIR for acute drop in hemoglobin.  Melanotic stools noted in the hospital. -Underwent EGD 3 times this admission because of persistent drop in  hemoglobin and melanotic stool-11/23, 11/27 and 11/30.  Noted to have Candida esophagitis, grade D esophagitis and nonbleeding gastric ulcers as well as duodenal ulcers.  No active bleeding identified in an  EGD. -Over the next several days, patient was monitored for continued melanotic stool and drop in hemoglobin. Since 12/4, she has not had melanotic stool.  Hemoglobin is stable.  Candida esophagitis -Per EGD from 11/30.  Started on nystatin swish and swallow.  10 more days at discharge.  Acute blood loss anemia -From GI bleeding. -Received a total of 15 units of PRBC so far this admission. -Currently aspirin and Plavix are on hold. -Hemoglobin 8.5 today.   -Continue to monitor hemoglobin.  Transfuse if less than 7. Recent Labs    02/27/20 0517 02/27/20 0517 02/28/20 0437 02/28/20 0437 02/29/20 0335 02/29/20 0335 03/01/20 0830 03/02/20 0640  HGB 8.2*  --  7.9*  --  8.2*  --  8.9* 8.5*  MCV 90.2   < > 92.6   < > 91.0   < > 89.7 91.5   < > = values in this interval not displayed.   left hip fracture -s/p repair on 11/14 -continue PT/OT -Patient is now too deconditioned for CIR. needs SNF.  Acute respiratory failure with hypoxia -Considered due to atelectasis -Working well with spirometry -Continue low-flow oxygen.  Wean down as tolerated.  Pulmonary emboli -Incidental finding on CTA abdomen on 02/23/2020.  Pulmonary embolism is located in left lower lung, but CTA chest would be needed to fully evaluate. Due to recently recovering renal function, as well as coexisting active bleeding, patient is not a candidate for anticoagulation. -DVT scan negative as well.  Right cephalic vein superficial thrombosis -Patient has right upper extremity swelling.  Ultrasound duplex did not show any DVT but showed right cephalic vein superficial thrombophlebitis.  History of CVA -subacute CVA/ small SDH on previous admit. -Aspirin and Plavix remain on hold.  I will continue to hold them for next several weeks.  UTI -11/27, complaint of pain with urination.  Urinalysis with positive leukocytes, few bacteria.  Completed 5-day course of IV Rocephin.  Urinary symptoms resolved.  Purewick catheter in place  AKI on CKD 4 Chronic bilateral lower extremity lymphedema -Baseline creatinine less than 1.7.  During this hospitalization, creatinine worsened to peak at 2.53.  Gradually improved with IV fluid.   -Patient has chronic bilateral lower extreme lymphedema and was taking Bumex 2 mg daily at home.  Currently she has used bilateral pedal edema, worse than her baseline. -12/2, I started the patient back on Bumex 2 mg daily today.  Pedal edema and renal function improving. -Continue to monitor renal function and pedal edema. Recent Labs    02/22/20 0523 02/23/20 0332 02/24/20 0506 02/25/20 0700 02/26/20 0504 02/27/20 0517 02/28/20 0437 02/29/20 0335 03/01/20 0531 03/02/20 0640  BUN 35* 33* 39* 39* 32* 20 11 9 9 10   CREATININE 1.42* 1.36* 1.36* 1.32* 1.31* 1.31* 1.21* 1.22* 1.09* 1.22*   Uncontrolled type 2 diabetes mellitus - A1c 8.8% on 11/12 -Continue Januvia, SSI and CBG monitoring Last Labs          Recent Labs  Lab 02/29/20 1147 02/29/20 1536 02/29/20 2043 03/01/20 0608 03/01/20 1109  GLUCAP 116* 133* 145* 127* 142*     Pulmonary nodule -LLL 5mm, consider f/u scan in 12 months.  Wound care: Incision (Closed) 12/14/16 Breast Right;Posterior (Active)  Date First Assessed/Time First Assessed: 12/14/16 0908   Location: Breast  Location Orientation: Right;Posterior  Present on  Admission: Yes    Assessments 12/14/2016 10:00 AM 02/28/2020  8:00 AM  Dressing Type Gauze (Comment);Liquid skin adhesive Transparent dressing  Dressing Clean;Dry;Intact Clean;Dry;Intact  Site / Wound Assessment Clean;Dry --  Margins Attached edges (approximated) --  Closure Skin glue --  Drainage Amount None --     No Linked orders to display     Incision (Closed) 12/14/16 Axilla Left (Active)  Date First Assessed/Time First Assessed: 12/14/16 0908   Location: Axilla  Location Orientation: Left    Assessments 12/14/2016 10:00 AM 12/14/2016 11:11 AM  Dressing Type  Gauze (Comment) Gauze (Comment)  Dressing Clean;Dry;Intact Clean;Dry;Intact  Site / Wound Assessment Clean;Dry --  Margins Attached edges (approximated) --  Closure Skin glue Skin glue  Drainage Amount None None     No Linked orders to display     Incision (Closed) 12/14/16 Axilla Right (Active)  Date First Assessed/Time First Assessed: 12/14/16 0908   Location: Axilla  Location Orientation: Right    Assessments 12/14/2016 10:00 AM 12/14/2016 11:11 AM  Dressing Type Gauze (Comment) Gauze (Comment)  Dressing Clean;Dry;Intact Intact;Dry;Clean  Site / Wound Assessment Clean;Dry Clean;Dry  Margins Attached edges (approximated) --  Closure Skin glue Skin glue  Drainage Amount None None     No Linked orders to display     Incision (Closed) 02/22/18 Foot Right (Active)  Date First Assessed/Time First Assessed: 02/22/18 1052   Location: Foot  Location Orientation: Right    Assessments 02/22/2018 11:33 AM 03/02/2018  7:45 AM  Dressing Type Compression wrap Compression wrap  Dressing Clean;Dry;Intact --  Drainage Amount None --  Treatment Negative pressure wound therapy --     No Linked orders to display     Incision (Closed) 02/10/20 Hip Left (Active)  Date First Assessed/Time First Assessed: 02/10/20 1540   Location: Hip  Location Orientation: Left    Assessments 02/10/2020  5:00 PM 03/01/2020 11:15 PM  Dressing Type Adhesive bandage Foam - Lift dressing to assess site every shift  Dressing Clean;Dry;Intact Clean;Dry;Intact  Dressing Change Frequency -- PRN  Site / Wound Assessment Dressing in place / Unable to assess Clean;Dry  Drainage Amount None --     No Linked orders to display     Wound / Incision (Open or Dehisced) 02/15/20 Other (Comment) Sacrum Medial Fissure (Active)  Date First Assessed/Time First Assessed: 02/15/20 1650   Wound Type: (c) Other (Comment)  Location: Sacrum  Location Orientation: Medial  Wound Description (Comments): Fissure  Present on Admission: Yes     Assessments 02/15/2020  7:00 PM 03/01/2020 11:15 PM  Dressing Type Foam - Lift dressing to assess site every shift Foam - Lift dressing to assess site every shift  Dressing Status -- Clean;Dry;Intact  Dressing Change Frequency -- PRN  Site / Wound Assessment Red;Painful --  Wound Length (cm) 4.2 cm --  Drainage Amount None --     No Linked orders to display    Discharge Exam:   Vitals:   03/02/20 0008 03/02/20 0400 03/02/20 0700 03/02/20 1139  BP:  (!) 130/51 (!) 144/68 (!) 166/78  Pulse:  96 93 99  Resp:  20 20 19   Temp:  99 F (37.2 C) 97.9 F (36.6 C) 98.7 F (37.1 C)  TempSrc:  Oral Oral Oral  SpO2:  96% 96% 96%  Weight: 90.9 kg     Height:        Body mass index is 30.47 kg/m.  General exam: Pleasant, elderly Caucasian female.  Not in distress.  Sitting up in  chair. Skin: No rashes, lesions or ulcers. HEENT: Atraumatic, normocephalic, no obvious bleeding Lungs: Clear to auscultation bilaterally CVS: Regular rate and rhythm, no murmur GI/Abd soft, nontender, nondistended, bowel sound present CNS: Alert, awake, oriented x3 Psychiatry: Mood appropriate Extremities: 2+ bilateral pedal edema, improving  Follow ups:   Discharge Instructions    Increase activity slowly   Complete by: As directed    Leave dressing on - Keep it clean, dry, and intact until clinic visit   Complete by: As directed       Contact information for follow-up providers    Karl Ito, DO Follow up.   Specialty: General Practice Contact information: [address] [place] [address] [phone]        Marykay Lex, MD .   Specialty: Cardiology Contact information: [address] [place] [address] [phone]        Vida Rigger, MD Follow up.   Specialty: Gastroenterology Contact information: [address] [place] [address] [phone]            Contact information for after-discharge care    Destination     [facility] Preferred SNF .   Service: Skilled Nursing Contact information: [address] [place] [address] [phone]                  Recommendations for Outpatient Follow-Up:   1. Follow-up with PCP as an outpatient 2. Follow-up with GI as an outpatient  Discharge Instructions:  Follow with Primary MD Karl Ito, DO in 7 days   Get CBC/BMP checked in next visit within 1 week by PCP or SNF MD ( we routinely change or add medications that can affect your baseline labs and fluid status, therefore we recommend that you get the mentioned basic workup next visit with your PCP, your PCP may decide not to get them or add new tests based on their clinical decision)  On your next visit with your PCP, please Get Medicines reviewed and adjusted.  Please request your PCP  to go over all Hospital Tests and Procedure/Radiological results at the follow up, please get all Hospital records sent to your Prim MD by signing hospital release before you go home.  Activity: As tolerated with Full fall precautions use walker/cane & assistance as needed  For Heart failure patients - Check your Weight same time everyday, if you gain over 2 pounds, or you develop in leg swelling, experience more shortness of breath or chest pain, call your Primary MD immediately. Follow Cardiac Low Salt Diet and 1.5 lit/day fluid restriction.  If you have smoked or chewed Tobacco in the last 2 yrs please stop smoking, stop any regular Alcohol  and or any Recreational drug use.  If you experience worsening of your admission symptoms, develop shortness of breath, life threatening emergency, suicidal or homicidal thoughts you must seek medical attention immediately by calling 911 or calling your MD immediately  if symptoms less severe.  You Must read complete instructions/literature along with all the possible adverse reactions/side effects for all the Medicines you take and that have been  prescribed to you. Take any new Medicines after you have completely understood and accpet all the possible adverse reactions/side effects.   Do not drive, operate heavy machinery, perform activities at heights, swimming or participation in water activities or provide baby sitting services if your were admitted for syncope or siezures until you have seen by Primary MD or a Neurologist and  advised to do so again.  Do not drive when taking Pain medications.  Do not take more than prescribed Pain, Sleep and Anxiety Medications  Wear Seat belts while driving.   Please note You were cared for by a hospitalist during your hospital stay. If you have any questions about your discharge medications or the care you received while you were in the hospital after you are discharged, you can call the unit and asked to speak with the hospitalist on call if the hospitalist that took care of you is not available. Once you are discharged, your primary care physician will handle any further medical issues. Please note that NO REFILLS for any discharge medications will be authorized once you are discharged, as it is imperative that you return to your primary care physician (or establish a relationship with a primary care physician if you do not have one) for your aftercare needs so that they can reassess your need for medications and monitor your lab values.    Allergies as of 03/02/2020      Reactions   Nsaids Nausea And Vomiting, Palpitations   Reglan [metoclopramide] Other (See Comments)   Seizures    Tramadol Other (See Comments)   Seizures    Ultram [tramadol] Other (See Comments)   Pt has seizure activity with this medication   Lipitor [atorvastatin] Other (See Comments)   Bone and muscle pain   Lipitor [atorvastatin] Swelling, Other (See Comments)   Pain    Lyrica [pregabalin] Other (See Comments)   Weight gain, extremity swelling   Lyrica [pregabalin] Swelling   Reglan [metoclopramide] Other (See  Comments)   Tardive dyskinesia; paradoxical reaction not relieved by benadryl   Nsaids Swelling   SWELLING REACTION UNSPECIFIED    Urecholine [bethanechol]    Palpitations and nausea      Medication List    STOP taking these medications   clopidogrel 75 MG tablet Commonly known as: PLAVIX   diazepam 5 MG tablet Commonly known as: VALIUM   oxyCODONE-acetaminophen 5-325 MG tablet Commonly known as: PERCOCET/ROXICET   potassium chloride 20 MEQ packet Commonly known as: KLOR-CON   Resveratrol 250 MG Caps     TAKE these medications   alum & mag hydroxide-simeth 200-200-20 MG/5ML suspension Commonly known as: MAALOX/MYLANTA Take 30 mLs by mouth every 4 (four) hours as needed for indigestion.   bethanechol 25 MG tablet Commonly known as: URECHOLINE Take 1 tablet (25 mg total) by mouth 3 (three) times daily.   BIOTIN PO Take 1 tablet by mouth daily.   bumetanide 2 MG tablet Commonly known as: BUMEX Take 2 mg by mouth daily. 2 in morning and 1 in afternoon   Calcium 600+D3 600-800 MG-UNIT Tabs Generic drug: Calcium Carb-Cholecalciferol Take 1 tablet by mouth daily.   Cerefolin NAC 6-2-600 MG Tabs Take 1 tablet by mouth in the morning.   Cerefolin NAC 6-2-600 MG Tabs TAKE 1 TABLET BY MOUTH EVERY DAY   Colcrys 0.6 MG tablet Generic drug: colchicine TAKE 1 TABLET DAILY AS NEEDED FOR GOUT FLARE. What changed: Another medication with the same name was removed. Continue taking this medication, and follow the directions you see here.   COQ-10 PO Take 1 tablet by mouth daily.   Cranberry 500 MG Tabs Take 500 mg by mouth every evening.   CRANBERRY-CALCIUM PO Take 1 tablet by mouth daily.   exemestane 25 MG tablet Commonly known as: AROMASIN TAKE 1 TABLET DAILY AFTER BREAKFAST   febuxostat 40 MG tablet Commonly known as:  ULORIC Take 40 mg by mouth daily. What changed: Another medication with the same name was removed. Continue taking this medication, and follow  the directions you see here.   feeding supplement (GLUCERNA SHAKE) Liqd Take 237 mLs by mouth 3 (three) times daily between meals for 7 days.   gabapentin 300 MG capsule Commonly known as: NEURONTIN Take 1 capsule (300 mg total) by mouth 2 (two) times daily. Take 1 during the day if needed and 2 at night   gabapentin 300 MG capsule Commonly known as: NEURONTIN Take 300 mg by mouth 2 (two) times daily.   guaiFENesin 600 MG 12 hr tablet Commonly known as: MUCINEX Take 1 tablet (600 mg total) by mouth 2 (two) times daily.   hydrocortisone 25 MG suppository Commonly known as: ANUSOL-HC Place 1 suppository (25 mg total) rectally 2 (two) times daily as needed for anal itching.   insulin aspart 100 UNIT/ML injection Commonly known as: novoLOG Inject 0-9 Units into the skin 4 (four) times daily -  before meals and at bedtime.   Januvia 25 MG tablet Generic drug: sitaGLIPtin Take 25 mg by mouth daily.   Januvia 25 MG tablet Generic drug: sitaGLIPtin Take 25 mg by mouth daily.   Krill Oil 500 MG Caps Take 500 mg by mouth daily.   KRILL OIL PO Take 1 capsule by mouth daily.   metolazone 2.5 MG tablet Commonly known as: ZAROXOLYN Take 2.5 mg by mouth daily. 30 minutes before Bumetanide Mon. & Thurs.   metolazone 2.5 MG tablet Commonly known as: ZAROXOLYN TAKE 1 TABLET DAILY 30 MINUTES BEFORE THE MORNING DOSE OF BUMEX AS DIRECTED   metoprolol succinate 25 MG 24 hr tablet Commonly known as: TOPROL-XL Take 12.5 mg by mouth daily.   metoprolol succinate 25 MG 24 hr tablet Commonly known as: TOPROL-XL Take 25 mg by mouth at bedtime.   mirtazapine 15 MG tablet Commonly known as: REMERON Take 15 mg by mouth at bedtime. Taking 1 1/2 tablet at bedtime   mirtazapine 15 MG tablet Commonly known as: REMERON Take 15 mg by mouth at bedtime.   multivitamin with minerals Tabs tablet Take 1 tablet by mouth daily. Start taking on: March 03, 2020   nitroGLYCERIN 0.4 MG SL  tablet Commonly known as: NITROSTAT Place 0.4 mg under the tongue every 5 (five) minutes as needed for chest pain.   nitroGLYCERIN 0.4 MG/SPRAY spray Commonly known as: NITROLINGUAL Place 1 spray under the tongue every 5 (five) minutes x 3 doses as needed for chest pain.   nystatin 100000 UNIT/ML suspension Commonly known as: MYCOSTATIN Take 5 mLs (500,000 Units total) by mouth 4 (four) times daily for 10 days.   pantoprazole 40 MG tablet Commonly known as: PROTONIX Take 1 tablet (40 mg total) by mouth 2 (two) times daily before a meal. What changed: when to take this   pramipexole 0.25 MG tablet Commonly known as: MIRAPEX TAKE 1 TO 2 TABLETS AS INSTRUCTED, 1 TABLET AT 4:00 P.M. AND 2 TABLETS AT BEDTIME What changed: Another medication with the same name was removed. Continue taking this medication, and follow the directions you see here.   Qunol CoQ10/Ubiquinol/Mega 100 MG Caps Generic drug: Ubiquinol Take 100 mg by mouth every evening. CoQ10, Qunol   rosuvastatin 20 MG tablet Commonly known as: CRESTOR Take 20 mg by mouth daily. What changed: Another medication with the same name was removed. Continue taking this medication, and follow the directions you see here.   sennosides-docusate sodium 8.6-50 MG  tablet Commonly known as: SENOKOT-S Take 1 tablet by mouth daily as needed for constipation.   Vitamin D 50 MCG (2000 UT) tablet Take 2,000 Units by mouth daily.            Discharge Care Instructions  (From admission, onward)         Start     Ordered   03/02/20 0000  Leave dressing on - Keep it clean, dry, and intact until clinic visit        03/02/20 1321          Time coordinating discharge: 35 minutes  The results of significant diagnostics from this hospitalization (including imaging, microbiology, ancillary and laboratory) are listed below for reference.    Procedures and Diagnostic Studies:   CT ABDOMEN PELVIS WO CONTRAST  Result Date:  02/17/2020 CLINICAL DATA:  Nausea and vomiting with drop in hemoglobin. Recent hip surgery. EXAM: CT ABDOMEN AND PELVIS WITHOUT CONTRAST TECHNIQUE: Multidetector CT imaging of the abdomen and pelvis was performed following the standard protocol without IV contrast. COMPARISON:  CT dated 11/16/2015 FINDINGS: Lower chest: There is a partially visualized pulmonary nodule in the left lower lobe measuring approximately 5 mm (axial series 4, image 1).The intracardiac blood pool is hypodense relative to the adjacent myocardium consistent with anemia. The heart size is normal. There are coronary artery calcifications. Hepatobiliary: The liver is normal. Status post cholecystectomy.There is no biliary ductal dilation. Pancreas: Normal contours without ductal dilatation. No peripancreatic fluid collection. Spleen: Unremarkable. Adrenals/Urinary Tract: --Adrenal glands: There is a stable left adrenal mass, consistent with a benign adrenal adenoma. --Right kidney/ureter: No hydronephrosis or radiopaque kidney stones. --Left kidney/ureter: No hydronephrosis or radiopaque kidney stones. --Urinary bladder: There is a tiny focus of gas within the urinary bladder, likely representing sequela of recent instrumentation. Stomach/Bowel: --Stomach/Duodenum: No hiatal hernia or other gastric abnormality. Normal duodenal course and caliber. --Small bowel: Unremarkable. --Colon: There is an above average amount of stool in the colon. There are few scattered colonic diverticula without CT evidence for diverticulitis. --Appendix: Normal. Vascular/Lymphatic: Atherosclerotic calcification is present within the non-aneurysmal abdominal aorta, without hemodynamically significant stenosis. --No retroperitoneal lymphadenopathy. --No mesenteric lymphadenopathy. --No pelvic or inguinal lymphadenopathy. Reproductive: The pelvis is suboptimally evaluated secondary to streak artifact. However, the patient appears to be status post prior hysterectomy.  Other: No ascites or free air. The abdominal wall is normal. Musculoskeletal. There are expected postsurgical changes related to recent total hip arthroplasty on the left. There is no large adjacent hematoma. The hardware appears grossly intact where visualized. IMPRESSION: 1. No acute intra-abdominal abnormality detected. No specific abnormality detected to explain the patient's anemia. 2. Expected postsurgical changes related to recent left total hip arthroplasty. 3. There is a partially visualized 5 mm pulmonary nodule in the left lower lobe. In a low risk patient, no further follow-up is required. No follow-up needed if patient is low-risk. Non-contrast chest CT can be considered in 12 months if patient is high-risk. This recommendation follows the consensus statement: Guidelines for Management of Incidental Pulmonary Nodules Detected on CT Images: From the Fleischner Society 2017; Radiology 2017; 284:228-243. 4. Anemia. Aortic Atherosclerosis (ICD10-I70.0). Electronically Signed   By: Katherine Mantle M.D.   On: 02/17/2020 19:27   DG CHEST PORT 1 VIEW  Result Date: 02/18/2020 CLINICAL DATA:  Central line placement EXAM: PORTABLE CHEST 1 VIEW COMPARISON:  Chest x-Rembold dated 02/17/2020 FINDINGS: There is a newly placed right-sided central venous catheter with the tip terminating over the SVC. There is no  right-sided pneumothorax. The heart size is similar to prior study. Aortic calcifications are noted. There are developing bibasilar airspace opacities which are favored to represent atelectasis. There is elevation of the right hemidiaphragm. There are degenerative changes of both shoulders. There is no acute displaced fracture. IMPRESSION: 1. Newly placed right-sided central venous catheter as above with no evidence for pneumothorax. 2. Developing bibasilar atelectasis. Electronically Signed   By: Katherine Mantle M.D.   On: 02/18/2020 19:01   DG Chest Port 1 View  Result Date: 02/17/2020 CLINICAL  DATA:  Respiratory distress.  Hypertension. EXAM: PORTABLE CHEST 1 VIEW COMPARISON:  None. FINDINGS: Stable cardiomegaly. The hila and mediastinum are unchanged. The lungs are clear. No other acute abnormalities. IMPRESSION: Stable cardiomegaly. No focal infiltrates or pulmonary abnormalities identified. Electronically Signed   By: Gerome Sam III M.D   On: 02/17/2020 12:26   [place] EKG SITE RITE  Result Date: 02/17/2020 If Site Rite image not attached, placement could not be confirmed due to current cardiac rhythm.    Labs:   Basic Metabolic Panel: Recent Labs  Lab 02/25/20 0700 02/25/20 0700 02/26/20 0504 02/26/20 0504 02/27/20 0517 02/27/20 0517 02/28/20 0437 02/28/20 0437 02/29/20 0335 02/29/20 0335 03/01/20 0531 03/02/20 0640  NA 140   < > 139   < > 140  --  140  --  141  --  141 138  K 3.8   < > 3.6   < > 3.7   < > 3.7   < > 3.5   < > 3.3* 3.8  CL 106   < > 105   < > 106  --  107  --  105  --  102 101  CO2 25   < > 25   < > 24  --  26  --  27  --  29 29  GLUCOSE 128*   < > 128*   < > 128*  --  137*  --  125*  --  137* 212*  BUN 39*   < > 32*   < > 20  --  11  --  9  --  9 10  CREATININE 1.32*   < > 1.31*   < > 1.31*  --  1.21*  --  1.22*  --  1.09* 1.22*  CALCIUM 7.7*   < > 8.0*   < > 8.3*  --  8.3*  --  8.5*  --  8.8* 8.5*  MG 1.8  --  2.1  --  2.1  --  2.0  --   --   --   --   --    < > = values in this interval not displayed.   GFR Estimated Creatinine Clearance: 42.6 mL/min (A) (by C-G formula based on SCr of 1.22 mg/dL (H)). Liver Function Tests: Recent Labs  Lab 03/02/20 0640  AST 29  ALT 24  ALKPHOS 89  BILITOT 0.4  PROT 5.2*  ALBUMIN 2.0*   No results for input(s): LIPASE, AMYLASE in the last 168 hours. No results for input(s): AMMONIA in the last 168 hours. Coagulation profile No results for input(s): INR, PROTIME in the last 168 hours.  CBC: Recent Labs  Lab 02/27/20 0517 02/28/20 0437 02/29/20 0335 03/01/20 0830 03/02/20 0640  WBC 9.3  9.5 8.4 9.2 8.8  NEUTROABS 7.3 7.9* 6.5 7.7 7.3  HGB 8.2* 7.9* 8.2* 8.9* 8.5*  HCT 26.8* 26.2* 27.4* 28.7* 28.1*  MCV 90.2 92.6 91.0 89.7 91.5  PLT 286  281 308 321 299   Cardiac Enzymes: No results for input(s): CKTOTAL, CKMB, CKMBINDEX, TROPONINI in the last 168 hours. BNP: Invalid input(s): POCBNP CBG: Recent Labs  Lab 03/01/20 1109 03/01/20 1601 03/01/20 2317 03/02/20 0641 03/02/20 1133  GLUCAP 142* 154* 112* 191* 127*   D-Dimer No results for input(s): DDIMER in the last 72 hours. Hgb A1c No results for input(s): HGBA1C in the last 72 hours. Lipid Profile No results for input(s): CHOL, HDL, LDLCALC, TRIG, CHOLHDL, LDLDIRECT in the last 72 hours. Thyroid function studies No results for input(s): TSH, T4TOTAL, T3FREE, THYROIDAB in the last 72 hours.  Invalid input(s): FREET3 Anemia work up No results for input(s): VITAMINB12, FOLATE, FERRITIN, TIBC, IRON, RETICCTPCT in the last 72 hours. Microbiology Recent Results (from the past 240 hour(s))  Resp Panel by RT-PCR (Flu A&B, Covid) Nasopharyngeal Swab     Status: None   Collection Time: 03/02/20 10:32 AM   Specimen: Nasopharyngeal Swab; Nasopharyngeal(NP) swabs in vial transport medium  Result Value Ref Range Status   SARS Coronavirus 2 by RT PCR NEGATIVE NEGATIVE Final    Comment: (NOTE) SARS-CoV-2 target nucleic acids are NOT DETECTED.  The SARS-CoV-2 RNA is generally detectable in upper respiratory specimens during the acute phase of infection. The lowest concentration of SARS-CoV-2 viral copies this assay can detect is 138 copies/mL. A negative result does not preclude SARS-Cov-2 infection and should not be used as the sole basis for treatment or other patient management decisions. A negative result may occur with  improper specimen collection/handling, submission of specimen other than nasopharyngeal swab, presence of viral mutation(s) within the areas targeted by this assay, and inadequate number of  viral copies(<138 copies/mL). A negative result must be combined with clinical observations, patient history, and epidemiological information. The expected result is Negative.  Fact Sheet for Patients:  BloggerCourse.com  Fact Sheet for Healthcare Providers:  SeriousBroker.it  This test is no t yet approved or cleared by the [place] FDA and  has been authorized for detection and/or diagnosis of SARS-CoV-2 by FDA under an Emergency Use Authorization (EUA). This EUA will remain  in effect (meaning this test can be used) for the duration of the COVID-19 declaration under Section 564(b)(1) of the Act, 21 U.S.C.section 360bbb-3(b)(1), unless the authorization is terminated  or revoked sooner.       Influenza A by PCR NEGATIVE NEGATIVE Final   Influenza B by PCR NEGATIVE NEGATIVE Final    Comment: (NOTE) The Xpert Xpress SARS-CoV-2/FLU/RSV plus assay is intended as an aid in the diagnosis of influenza from Nasopharyngeal swab specimens and should not be used as a sole basis for treatment. Nasal washings and aspirates are unacceptable for Xpert Xpress SARS-CoV-2/FLU/RSV testing.  Fact Sheet for Patients: BloggerCourse.com  Fact Sheet for Healthcare Providers: SeriousBroker.it  This test is not yet approved or cleared by the [place] FDA and has been authorized for detection and/or diagnosis of SARS-CoV-2 by FDA under an Emergency Use Authorization (EUA). This EUA will remain in effect (meaning this test can be used) for the duration of the COVID-19 declaration under Section 564(b)(1) of the Act, 21 U.S.C. section 360bbb-3(b)(1), unless the authorization is terminated or revoked.  Performed at [facility], [address], [place], [place] [address]      Signed: Lorin Glass  [facility] 03/02/2020, 2:08 PM

## 2020-03-02 NOTE — Progress Notes (Signed)
[facility] here to transport patient to SNF. RN unaware that [facility] had been called and RN was not given number for report until RN call SW for information.   Report called to Kathlee Nations at [employer].

## 2020-03-02 NOTE — TOC Progression Note (Signed)
Transition of Care [facility]) - Progression Note    Patient Details  Name: Elvena Oyer MRN: [id number] Date of Birth: 07-26-1940  Transition [facility]) CM/SW [place], [place] Phone Number: [phone] 03/02/2020, 8:42 AM  Clinical Narrative:     CSW received a message from [place] at [facility] and she informed CSW that she is unable to have a bed for patient today. CSW inquired about tomorrow and Claiborne Billings stated that she was not 100% sure.    CSW spoke with patient's daughter [name] and let her know that [facility] does not have a bed today and what would be their next choice. [name] informed CSW that she had to check with her sister and call CSW back.  TOC team will continue to assist with discharge planning needs.    Expected Discharge Plan and Services                                                 Social Determinants of Health (SDOH) Interventions    Readmission Risk Interventions No flowsheet data found.

## 2020-03-02 NOTE — TOC Transition Note (Signed)
Transition of Care [facility]) - CM/SW Discharge Note   Patient Details  Name: [name] MRN: [id number] Date of Birth: December 16, 1940  Transition of Care [facility]) CM/SW Contact:  Bary Castilla, LCSW Phone Number: [phone] 03/02/2020, 2:13 PM   Clinical Narrative:     Patient will DC to:?[place] date:?03/02/20 Family notified:?Angela Transport OE:UMPN   Per MD patient ready for DC to [facility]. RN, patient, patient's family, and facility notified of DC. Discharge Summary sent to facility. RN given number for report [phone] room 121 . DC packet on chart. Ambulance transport requested for patient.   CSW signing off.   [place], [place] [phone]   Final next level of care: Skilled Nursing Facility Barriers to Discharge: Barriers Resolved   Patient Goals and CMS Choice        Discharge Placement              Patient chooses bed at: [facility] Patient to be transferred to facility by: [place] Name of family member notified: Levada Dy Patient and family notified of of transfer: 03/02/20  Discharge Plan and Services                                     Social Determinants of Health (SDOH) Interventions     Readmission Risk Interventions No flowsheet data found.

## 2020-03-02 NOTE — Progress Notes (Signed)
   03/02/20 1459  Legal Guardian  Does Patient Have a Court Appointed Legal Guardian? No  Copy of Legal Guardianship Form in Chart No - copy requested  Legal Guardian Notified of Arrival  Attempted notification unsuccessful  Legal Guardian Notified of Pending Discharge  Attempted notification unsuccessful   Patient has a medical power of attorney. No documentation of appointed legal guardian. Family aware of discharge

## 2020-03-02 NOTE — NC FL2 (Signed)
[place] LEVEL OF CARE SCREENING TOOL     IDENTIFICATION  Patient Name: [name] Birthdate: 1940-06-01 Sex: female Admission Date (Current Location): 02/17/2020  [facility] and [place] Number:  Herbalist and Address:  [facility] [place], [place] [address] [place], [place] [address]      Provider Number: [id number]  Attending Physician Name and Address:  Terrilee Croak, MD  Relative Name and Phone Number:  Levada Dy [phone]    Current Level of Care: Hospital Recommended Level of Care: [place] Prior Approval Number:    Date Approved/Denied:   PASRR Number: Pending  Discharge Plan: SNF    Current Diagnoses: Patient Active Problem List   Diagnosis Date Noted  . Pulmonary emboli ([place]) 02/24/2020  . Pulmonary nodule 02/21/2020  . Acute respiratory failure with hypoxia ([place]) 02/21/2020  . S/p left hip fracture 02/21/2020  . History of CVA (cerebrovascular accident) 02/21/2020  . Acute renal failure superimposed on stage 4 chronic kidney disease ([place]) 02/21/2020  . Acute blood loss anemia 02/19/2020  . GIB (gastrointestinal bleeding)   . Subdural hemorrhage ([place]) 02/15/2020  . Hip fracture, left, closed, initial encounter ([place]) 02/08/2020  . CAD (coronary artery disease) 02/08/2020  . CKD (chronic kidney disease) stage 4, GFR 15-29 ml/min (HCC) 02/08/2020  . Chronic diastolic CHF (congestive heart failure) ([place]) 02/08/2020  . Type 2 diabetes mellitus with vascular disease ([place]) 02/08/2020  . Hip fracture ([place]) 02/08/2020  . Closed fracture of neck of left femur ([place]) 02/08/2020  . Somnolence, daytime 04/06/2018  . Chronic diastolic CHF (congestive heart failure), NYHA class 2 ([place]) 02/28/2018  . Hypotension   . Hypomagnesemia 02/25/2018  . Hypokalemia 02/24/2018  . Urinary retention 02/24/2018  . Posterior tibial tendinitis of right leg 02/22/2018  . Posterior tibial tendinitis, right leg   . Genetic  testing 12/03/2016  . 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 Malignant neoplasm of upper-outer quadrant of left breast in female, estrogen receptor positive ([place]) 11/19/2016  . Chronic idiopathic gout involving toe of left foot without tophus 11/15/2016  . Malignant 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, estrogen receptor positive ([place]) 11/09/2016  . Tendon tear, ankle, left, sequela   . Traumatic rupture of left anterior tibial tendon 04/20/2016  . Acute kidney injury superimposed on CKD ([place])   . 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 ([place])   . Headache, migraine   . 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 Restless legs syndrome 07/24/2015  . Mild cognitive impairment 01/08/2015  . Abnormality of gait 01/08/2015  . Dizziness 12/19/2014  . Preoperative cardiovascular examination 12/11/2013  . GERD (gastroesophageal reflux disease) 10/06/2013  . Syncope 10/05/2013  . Anxiety 10/28/2012  . Bilateral lower extremity edema 10/28/2012  . CAD S/P percutaneous coronary angioplasty   . Essential hypertension   . Dyslipidemia, goal LDL below 70     Orientation RESPIRATION BLADDER Height & Weight     Self, Time, Situation, Place  O2 (3L) Continent, External catheter Weight: 200 lb 6.4 oz (90.9 kg) Height:  5\' 8"  (172.7 cm)  BEHAVIORAL SYMPTOMS/MOOD NEUROLOGICAL BOWEL NUTRITION STATUS     (Dementia (mild)) Continent Diet (see discharge summary)  AMBULATORY STATUS COMMUNICATION OF NEEDS Skin   Extensive Assist Verbally Surgical wounds (hip)                       Personal Care Assistance Level of  Assistance  Bathing, Feeding, Dressing Bathing Assistance: Maximum assistance Feeding assistance: Independent Dressing Assistance: Maximum assistance     Functional Limitations Info  Sight, Hearing, Speech Sight Info: Adequate Hearing Info: Adequate Speech Info: Adequate    SPECIAL CARE FACTORS FREQUENCY  PT (By  licensed PT), OT (By licensed OT)     PT Frequency: 5X per week OT Frequency: 5X per week            Contractures Contractures Info: Not present    Additional Factors Info  Code Status, Allergies Code Status Info: Full Allergies Info: Nsaids, Reglan Metoclopramide, Tramadol, Ultram Tramadol, Lipitor Atorvastatin, Lyrica Pregabalin, Reglan Metoclopramide, Nsaids, Urecholine Bethanechol           Current Medications (03/02/2020):  This is the current hospital active medication list Current Facility-Administered Medications  Medication Dose Route Frequency Provider Last Rate Last Admin  . 0.9 %  sodium chloride infusion (Manually program via Guardrails IV Fluids)   Intravenous Once Clarene Essex, MD      . 0.9 %  sodium chloride infusion  250 mL Intravenous Continuous Clarene Essex, MD   Paused at 02/27/20 1655  . acetaminophen (TYLENOL) tablet 650 mg  650 mg Oral Q4H PRN Clarene Essex, MD   650 mg at 02/29/20 2123  . acetaminophen (TYLENOL) tablet 650 mg  650 mg Oral Once Clarene Essex, MD      . alum & mag hydroxide-simeth (MAALOX/MYLANTA) 200-200-20 MG/5ML suspension 30 mL  30 mL Oral Q4H PRN Clarene Essex, MD   30 mL at 02/21/20 0938  . bisacodyl (DULCOLAX) suppository 10 mg  10 mg Rectal Daily PRN Clarene Essex, MD   10 mg at 02/17/20 1940  . bumetanide (BUMEX) tablet 2 mg  2 mg Oral Daily Dahal, Binaya, MD   2 mg at 03/02/20 1005  . Cerefolin NAC 6-2-600 MG TABS 1 tablet  1 tablet Oral q AM Clarene Essex, MD   1 tablet at 03/02/20 0715  . Chlorhexidine Gluconate Cloth 2 % PADS 6 each  6 each Topical Daily Clarene Essex, MD   6 each at 03/01/20 0845  . exemestane (AROMASIN) tablet 25 mg  25 mg Oral QPC breakfast Clarene Essex, MD   25 mg at 03/02/20 1005  . febuxostat (ULORIC) tablet 40 mg  40 mg Oral Daily Clarene Essex, MD   40 mg at 03/02/20 1005  . feeding supplement (GLUCERNA SHAKE) (GLUCERNA SHAKE) liquid 237 mL  237 mL Oral TID WC Clarene Essex, MD   237 mL at 03/01/20 1645  . furosemide  (LASIX) injection 20 mg  20 mg Intravenous Once Clarene Essex, MD      . Gerhardt's butt cream 1 application  1 application Topical TID Clarene Essex, MD   1 application at 00/92/33 1006  . guaiFENesin (MUCINEX) 12 hr tablet 600 mg  600 mg Oral BID Dahal, Marlowe Aschoff, MD      . hydrocortisone (ANUSOL-HC) suppository 25 mg  25 mg Rectal BID PRN Clarene Essex, MD      . insulin aspart (novoLOG) injection 0-9 Units  0-9 Units Subcutaneous TID AC & HS Clarene Essex, MD   2 Units at 03/02/20 [phone]  . lidocaine (XYLOCAINE) 2 % jelly   Topical PRN Clarene Essex, MD      . linagliptin (TRADJENTA) tablet 5 mg  5 mg Oral Daily Clarene Essex, MD   5 mg at 03/02/20 1004  . menthol-cetylpyridinium (CEPACOL) lozenge 3 mg  1 lozenge Oral PRN Clarene Essex, MD   3  mg at 02/20/20 2131   Or  . phenol (CHLORASEPTIC) mouth spray 1 spray  1 spray Mouth/Throat PRN Clarene Essex, MD   1 spray at 02/22/20 1912  . methocarbamol (ROBAXIN) tablet 500 mg  500 mg Oral Q6H PRN Clarene Essex, MD   500 mg at 02/29/20 1212  . mirtazapine (REMERON) tablet 15 mg  15 mg Oral QHS Clarene Essex, MD   15 mg at 03/01/20 2318  . multivitamin with minerals tablet 1 tablet  1 tablet Oral Daily Clarene Essex, MD   1 tablet at 03/02/20 1004  . nitroGLYCERIN (NITROSTAT) SL tablet 0.4 mg  0.4 mg Sublingual Q5 Min x 3 PRN Clarene Essex, MD      . nystatin (MYCOSTATIN) 100000 UNIT/ML suspension 500,000 Units  5 mL Oral QID Terrilee Croak, MD   500,000 Units at 03/02/20 1004  . ondansetron (ZOFRAN) injection 4 mg  4 mg Intravenous Q6H PRN Clarene Essex, MD   4 mg at 02/17/20 2130  . pantoprazole (PROTONIX) injection 40 mg  40 mg Intravenous Q12H Salley Slaughter, PA-C   40 mg at 03/02/20 1004  . polyethylene glycol (MIRALAX / GLYCOLAX) packet 17 g  17 g Oral Daily PRN Clarene Essex, MD      . prochlorperazine (COMPAZINE) tablet 5-10 mg  5-10 mg Oral Q6H PRN Clarene Essex, MD      . rosuvastatin (CRESTOR) tablet 20 mg  20 mg Oral Daily Clarene Essex, MD   20 mg at 03/02/20 1006   . senna-docusate (Senokot-S) tablet 2 tablet  2 tablet Oral BID PRN Clarene Essex, MD   2 tablet at 02/29/20 2133  . sodium chloride flush (NS) 0.9 % injection 10-40 mL  10-40 mL Intracatheter Q12H Clarene Essex, MD   10 mL at 03/02/20 1006  . sodium chloride flush (NS) 0.9 % injection 10-40 mL  10-40 mL Intracatheter PRN Clarene Essex, MD      . sodium phosphate (FLEET) 7-19 GM/118ML enema 1 enema  1 enema Rectal Once PRN Clarene Essex, MD      . traZODone (DESYREL) tablet 25-50 mg  25-50 mg Oral QHS PRN Clarene Essex, MD   50 mg at 02/27/20 2133  . witch hazel-glycerin (TUCKS) pad   Topical PRN Clarene Essex, MD   Given at 02/24/20 1751     Discharge Medications: Please see discharge summary for a list of discharge medications.  Relevant Imaging Results:  Relevant Lab Results:   Additional Information SSN# [id number]  [place], [place]

## 2020-03-02 NOTE — TOC Progression Note (Signed)
Transition of Care [facility]) - Progression Note    Patient Details  Name: [name] MRN: [id number] Date of Birth: [birth year]  Transition of Care [facility]) CM/SW [place], [place] Phone Number: [phone] 03/02/2020, 11:10 AM  Clinical Narrative:     CSW spoke with Claiborne Billings again and she informed CSW that they had a semi-private room for patient today. CSW followed up with family to discuss the options. CSW received a call back from daughter Levada Dy and she informed CSW that they would be fine with patient going to [facility] today.  CSW spoke with Claiborne Billings and let her know that the PASRR was pending and she stated that one was not needed due to the waiver.  CSW alterted MD and RN of impending discharge today.  TOC team will continue to assist with discharge planning needs.      Expected Discharge Plan and Services                                                 Social Determinants of Health (SDOH) Interventions    Readmission Risk Interventions No flowsheet data found.

## 2020-03-02 NOTE — Plan of Care (Signed)

## 2020-03-03 LAB — GLUCOSE, CAPILLARY: Glucose-Capillary: 178 mg/dL — ABNORMAL HIGH (ref 70–99)

## 2020-03-04 DIAGNOSIS — K264 Chronic or unspecified duodenal ulcer with hemorrhage: Secondary | ICD-10-CM | POA: Diagnosis not present

## 2020-03-04 DIAGNOSIS — E1122 Type 2 diabetes mellitus with diabetic chronic kidney disease: Secondary | ICD-10-CM | POA: Diagnosis not present

## 2020-03-04 DIAGNOSIS — J9811 Atelectasis: Secondary | ICD-10-CM | POA: Diagnosis not present

## 2020-03-04 DIAGNOSIS — B3781 Candidal esophagitis: Secondary | ICD-10-CM | POA: Diagnosis not present

## 2020-03-04 DIAGNOSIS — D62 Acute posthemorrhagic anemia: Secondary | ICD-10-CM | POA: Diagnosis not present

## 2020-03-04 DIAGNOSIS — R059 Cough, unspecified: Secondary | ICD-10-CM | POA: Diagnosis not present

## 2020-03-07 DIAGNOSIS — R600 Localized edema: Secondary | ICD-10-CM | POA: Diagnosis not present

## 2020-03-07 DIAGNOSIS — J209 Acute bronchitis, unspecified: Secondary | ICD-10-CM | POA: Diagnosis not present

## 2020-03-07 DIAGNOSIS — R051 Acute cough: Secondary | ICD-10-CM | POA: Diagnosis not present

## 2020-03-07 DIAGNOSIS — R062 Wheezing: Secondary | ICD-10-CM | POA: Diagnosis not present

## 2020-03-07 DIAGNOSIS — R0989 Other specified symptoms and signs involving the circulatory and respiratory systems: Secondary | ICD-10-CM | POA: Diagnosis not present

## 2020-03-07 DIAGNOSIS — R5381 Other malaise: Secondary | ICD-10-CM | POA: Diagnosis not present

## 2020-03-11 DIAGNOSIS — J9601 Acute respiratory failure with hypoxia: Secondary | ICD-10-CM | POA: Diagnosis not present

## 2020-03-11 DIAGNOSIS — R051 Acute cough: Secondary | ICD-10-CM | POA: Diagnosis not present

## 2020-03-11 DIAGNOSIS — R5381 Other malaise: Secondary | ICD-10-CM | POA: Diagnosis not present

## 2020-03-11 DIAGNOSIS — R0989 Other specified symptoms and signs involving the circulatory and respiratory systems: Secondary | ICD-10-CM | POA: Diagnosis not present

## 2020-03-11 DIAGNOSIS — R062 Wheezing: Secondary | ICD-10-CM | POA: Diagnosis not present

## 2020-03-11 DIAGNOSIS — J9811 Atelectasis: Secondary | ICD-10-CM | POA: Diagnosis not present

## 2020-03-11 DIAGNOSIS — R600 Localized edema: Secondary | ICD-10-CM | POA: Diagnosis not present

## 2020-03-11 DIAGNOSIS — N184 Chronic kidney disease, stage 4 (severe): Secondary | ICD-10-CM | POA: Diagnosis not present

## 2020-03-11 DIAGNOSIS — I5032 Chronic diastolic (congestive) heart failure: Secondary | ICD-10-CM | POA: Diagnosis not present

## 2020-03-11 DIAGNOSIS — J209 Acute bronchitis, unspecified: Secondary | ICD-10-CM | POA: Diagnosis not present

## 2020-03-12 DIAGNOSIS — R5381 Other malaise: Secondary | ICD-10-CM | POA: Diagnosis not present

## 2020-03-12 DIAGNOSIS — J209 Acute bronchitis, unspecified: Secondary | ICD-10-CM | POA: Diagnosis not present

## 2020-03-12 DIAGNOSIS — R600 Localized edema: Secondary | ICD-10-CM | POA: Diagnosis not present

## 2020-03-12 DIAGNOSIS — R051 Acute cough: Secondary | ICD-10-CM | POA: Diagnosis not present

## 2020-03-12 DIAGNOSIS — R062 Wheezing: Secondary | ICD-10-CM | POA: Diagnosis not present

## 2020-03-12 DIAGNOSIS — R0989 Other specified symptoms and signs involving the circulatory and respiratory systems: Secondary | ICD-10-CM | POA: Diagnosis not present

## 2020-03-17 ENCOUNTER — Other Ambulatory Visit: Payer: Self-pay | Admitting: *Deleted

## 2020-03-17 NOTE — Patient Outreach (Signed)
Member screened for potential [facility] Care Management needs as a benefit of [place] [employer].  Update received from [place] reporting transition plan is for member to return home. Care plan meeting scheduled for 03/18/20. SW indicates family wants to bring [name] home before Christmas.  [name] was active with [facility] prior to admission. Attempted to reach Mrs. Haueter [phone]. No answer. HIPAA compliant voicemail message left. Also left HIPAA compliant voicemail message for daughter/DPR Lynne Leader [phone] to request return call.   Will continue to follow while member resides in SNF. Will plan to refer for [facility] Care Management RNCM follow up upon SNF transition. Mrs. Garrott was active with [facility] Management prior.    Marthenia Rolling, MSN, RN,BSN [place] Acute Care Coordinator [phone] [facility]) [phone]  (Toll free office)

## 2020-03-19 DIAGNOSIS — N184 Chronic kidney disease, stage 4 (severe): Secondary | ICD-10-CM | POA: Diagnosis not present

## 2020-03-19 DIAGNOSIS — G2581 Restless legs syndrome: Secondary | ICD-10-CM | POA: Diagnosis not present

## 2020-03-19 DIAGNOSIS — E1165 Type 2 diabetes mellitus with hyperglycemia: Secondary | ICD-10-CM | POA: Diagnosis not present

## 2020-03-19 DIAGNOSIS — I5032 Chronic diastolic (congestive) heart failure: Secondary | ICD-10-CM | POA: Diagnosis not present

## 2020-03-19 DIAGNOSIS — M6281 Muscle weakness (generalized): Secondary | ICD-10-CM | POA: Diagnosis not present

## 2020-03-19 DIAGNOSIS — J40 Bronchitis, not specified as acute or chronic: Secondary | ICD-10-CM | POA: Diagnosis not present

## 2020-03-19 DIAGNOSIS — I2510 Atherosclerotic heart disease of native coronary artery without angina pectoris: Secondary | ICD-10-CM | POA: Diagnosis not present

## 2020-03-19 DIAGNOSIS — K922 Gastrointestinal hemorrhage, unspecified: Secondary | ICD-10-CM | POA: Diagnosis not present

## 2020-03-21 DIAGNOSIS — I2510 Atherosclerotic heart disease of native coronary artery without angina pectoris: Secondary | ICD-10-CM | POA: Diagnosis not present

## 2020-03-21 DIAGNOSIS — M1A0720 Idiopathic chronic gout, left ankle and foot, without tophus (tophi): Secondary | ICD-10-CM | POA: Diagnosis not present

## 2020-03-21 DIAGNOSIS — N184 Chronic kidney disease, stage 4 (severe): Secondary | ICD-10-CM | POA: Diagnosis not present

## 2020-03-21 DIAGNOSIS — C50211 Malignant neoplasm of upper-inner quadrant of right female breast: Secondary | ICD-10-CM | POA: Diagnosis not present

## 2020-03-21 DIAGNOSIS — W19XXXD Unspecified fall, subsequent encounter: Secondary | ICD-10-CM | POA: Diagnosis not present

## 2020-03-21 DIAGNOSIS — E785 Hyperlipidemia, unspecified: Secondary | ICD-10-CM | POA: Diagnosis not present

## 2020-03-21 DIAGNOSIS — J40 Bronchitis, not specified as acute or chronic: Secondary | ICD-10-CM | POA: Diagnosis not present

## 2020-03-21 DIAGNOSIS — R911 Solitary pulmonary nodule: Secondary | ICD-10-CM | POA: Diagnosis not present

## 2020-03-21 DIAGNOSIS — E1165 Type 2 diabetes mellitus with hyperglycemia: Secondary | ICD-10-CM | POA: Diagnosis not present

## 2020-03-21 DIAGNOSIS — S72042D Displaced fracture of base of neck of left femur, subsequent encounter for closed fracture with routine healing: Secondary | ICD-10-CM | POA: Diagnosis not present

## 2020-03-21 DIAGNOSIS — G2581 Restless legs syndrome: Secondary | ICD-10-CM | POA: Diagnosis not present

## 2020-03-21 DIAGNOSIS — Z7902 Long term (current) use of antithrombotics/antiplatelets: Secondary | ICD-10-CM | POA: Diagnosis not present

## 2020-03-21 DIAGNOSIS — I2699 Other pulmonary embolism without acute cor pulmonale: Secondary | ICD-10-CM | POA: Diagnosis not present

## 2020-03-21 DIAGNOSIS — S065X0D Traumatic subdural hemorrhage without loss of consciousness, subsequent encounter: Secondary | ICD-10-CM | POA: Diagnosis not present

## 2020-03-21 DIAGNOSIS — E559 Vitamin D deficiency, unspecified: Secondary | ICD-10-CM | POA: Diagnosis not present

## 2020-03-21 DIAGNOSIS — I5032 Chronic diastolic (congestive) heart failure: Secondary | ICD-10-CM | POA: Diagnosis not present

## 2020-03-21 DIAGNOSIS — Z170 Estrogen receptor positive status [ER+]: Secondary | ICD-10-CM | POA: Diagnosis not present

## 2020-03-21 DIAGNOSIS — J9601 Acute respiratory failure with hypoxia: Secondary | ICD-10-CM | POA: Diagnosis not present

## 2020-03-21 DIAGNOSIS — I890 Lymphedema, not elsewhere classified: Secondary | ICD-10-CM | POA: Diagnosis not present

## 2020-03-21 DIAGNOSIS — I959 Hypotension, unspecified: Secondary | ICD-10-CM | POA: Diagnosis not present

## 2020-03-21 DIAGNOSIS — B3781 Candidal esophagitis: Secondary | ICD-10-CM | POA: Diagnosis not present

## 2020-03-21 DIAGNOSIS — E1122 Type 2 diabetes mellitus with diabetic chronic kidney disease: Secondary | ICD-10-CM | POA: Diagnosis not present

## 2020-03-21 DIAGNOSIS — D62 Acute posthemorrhagic anemia: Secondary | ICD-10-CM | POA: Diagnosis not present

## 2020-03-21 DIAGNOSIS — C50412 Malignant neoplasm of upper-outer quadrant of left female breast: Secondary | ICD-10-CM | POA: Diagnosis not present

## 2020-03-21 DIAGNOSIS — Z7984 Long term (current) use of oral hypoglycemic drugs: Secondary | ICD-10-CM | POA: Diagnosis not present

## 2020-03-25 DIAGNOSIS — E663 Overweight: Secondary | ICD-10-CM | POA: Diagnosis not present

## 2020-03-25 DIAGNOSIS — K922 Gastrointestinal hemorrhage, unspecified: Secondary | ICD-10-CM | POA: Diagnosis not present

## 2020-03-25 DIAGNOSIS — F419 Anxiety disorder, unspecified: Secondary | ICD-10-CM | POA: Diagnosis not present

## 2020-03-25 DIAGNOSIS — Z79899 Other long term (current) drug therapy: Secondary | ICD-10-CM | POA: Diagnosis not present

## 2020-03-25 DIAGNOSIS — I5032 Chronic diastolic (congestive) heart failure: Secondary | ICD-10-CM | POA: Diagnosis not present

## 2020-03-25 DIAGNOSIS — N390 Urinary tract infection, site not specified: Secondary | ICD-10-CM | POA: Diagnosis not present

## 2020-03-25 DIAGNOSIS — Z0000 Encounter for general adult medical examination without abnormal findings: Secondary | ICD-10-CM | POA: Diagnosis not present

## 2020-03-25 DIAGNOSIS — N184 Chronic kidney disease, stage 4 (severe): Secondary | ICD-10-CM | POA: Diagnosis not present

## 2020-03-25 DIAGNOSIS — E1142 Type 2 diabetes mellitus with diabetic polyneuropathy: Secondary | ICD-10-CM | POA: Diagnosis not present

## 2020-03-25 DIAGNOSIS — I130 Hypertensive heart and chronic kidney disease with heart failure and stage 1 through stage 4 chronic kidney disease, or unspecified chronic kidney disease: Secondary | ICD-10-CM | POA: Diagnosis not present

## 2020-03-25 DIAGNOSIS — Z6827 Body mass index (BMI) 27.0-27.9, adult: Secondary | ICD-10-CM | POA: Diagnosis not present

## 2020-03-25 DIAGNOSIS — I2699 Other pulmonary embolism without acute cor pulmonale: Secondary | ICD-10-CM | POA: Diagnosis not present

## 2020-03-26 DIAGNOSIS — M2550 Pain in unspecified joint: Secondary | ICD-10-CM | POA: Diagnosis not present

## 2020-03-26 DIAGNOSIS — S72002D Fracture of unspecified part of neck of left femur, subsequent encounter for closed fracture with routine healing: Secondary | ICD-10-CM | POA: Diagnosis not present

## 2020-03-26 DIAGNOSIS — N390 Urinary tract infection, site not specified: Secondary | ICD-10-CM | POA: Diagnosis not present

## 2020-03-27 DIAGNOSIS — S72002D Fracture of unspecified part of neck of left femur, subsequent encounter for closed fracture with routine healing: Secondary | ICD-10-CM | POA: Diagnosis not present

## 2020-03-27 DIAGNOSIS — M25552 Pain in left hip: Secondary | ICD-10-CM | POA: Diagnosis not present

## 2020-03-29 IMAGING — CT CT HEAD WITHOUT CONTRAST
3 series · 15 of 47 positions shown, 18 images · non-contrast
Comparison: None.

CLINICAL DATA: Occipital pain

EXAM:
CT HEAD WITHOUT CONTRAST
TECHNIQUE: Contiguous axial images were obtained from the base of the skull
through the vertex without intravenous contrast.

[Series 3: head 5.0 h30s · axial · 0.43mm/px · z∈[-148,-23]mm · 9 of 30 slices shown, 12 images]
[im 3/30  brain]
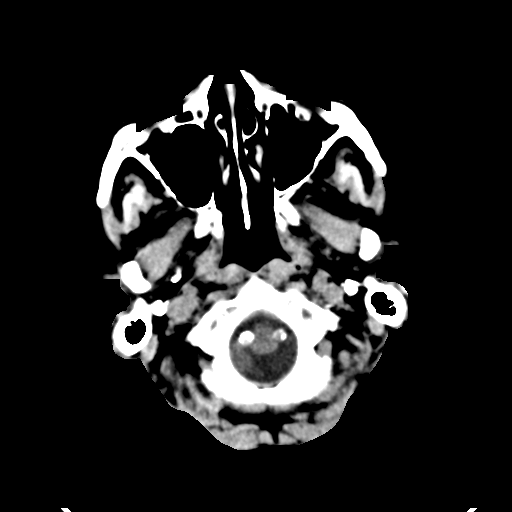
[im 3/30  bone]
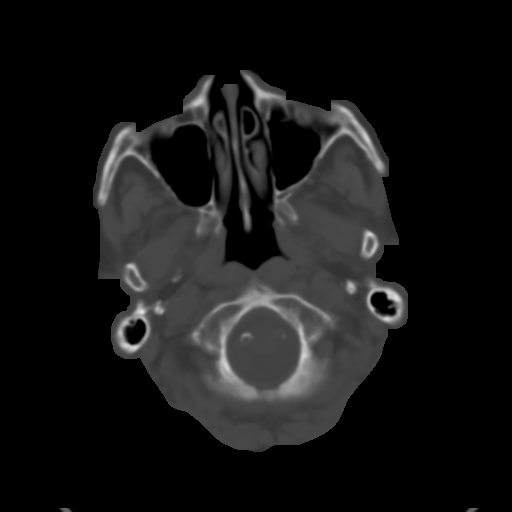
[im 6/30  brain]
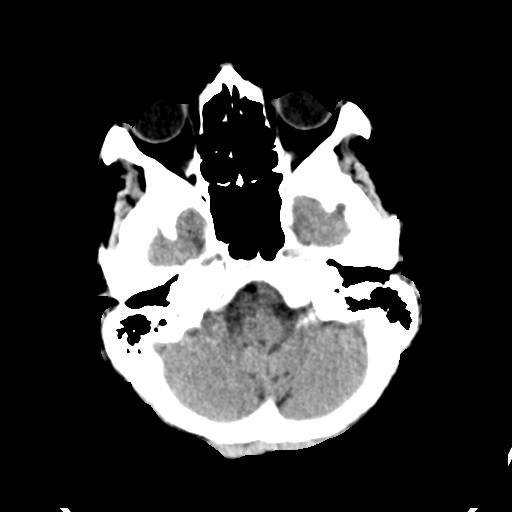
[im 9/30  brain]
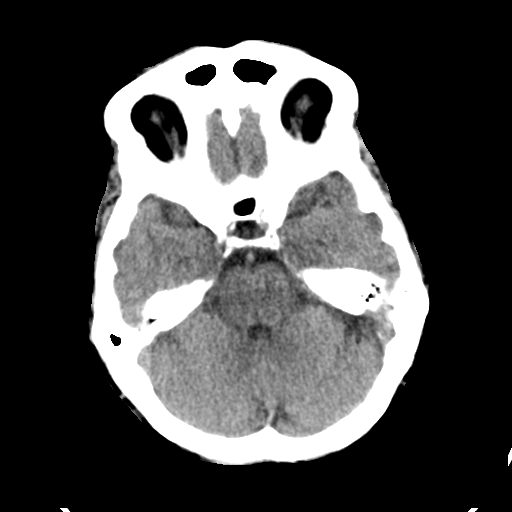
[im 12/30  brain]
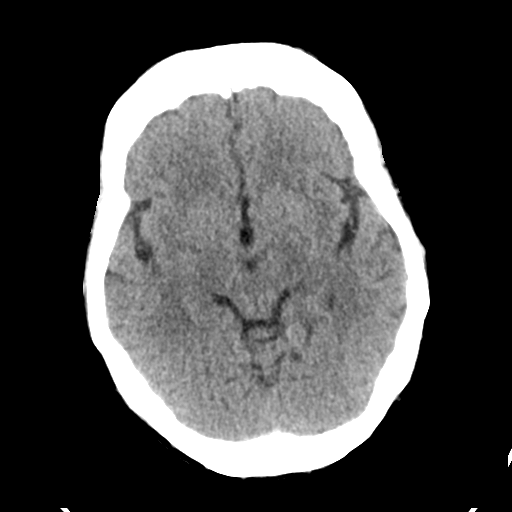
[im 16/30  brain]
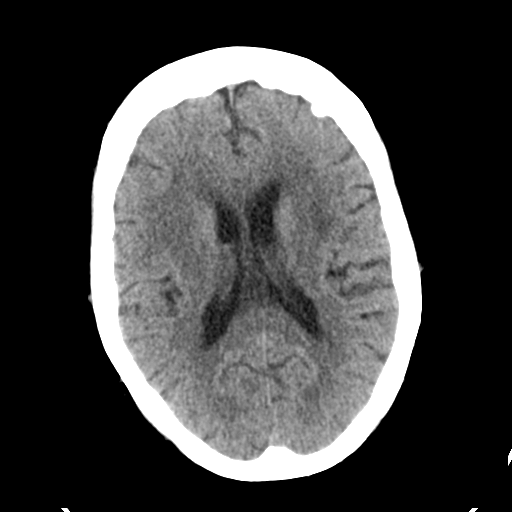
[im 16/30  bone]
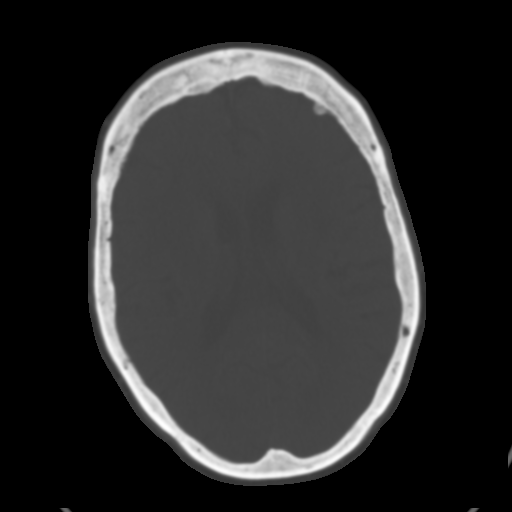
[im 19/30  brain]
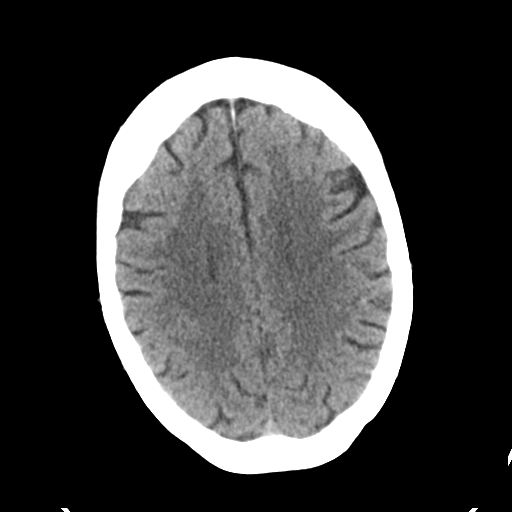
[im 22/30  brain]
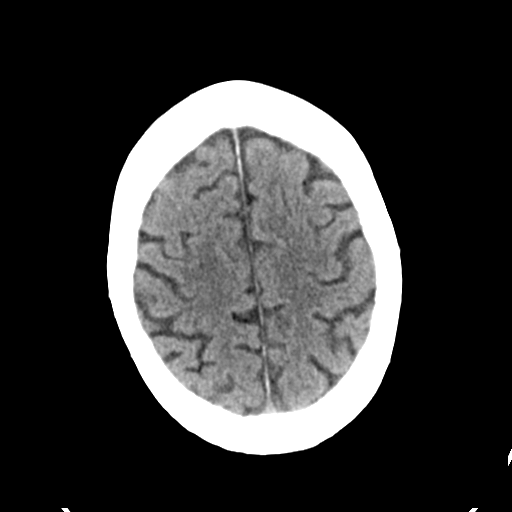
[im 25/30  brain]
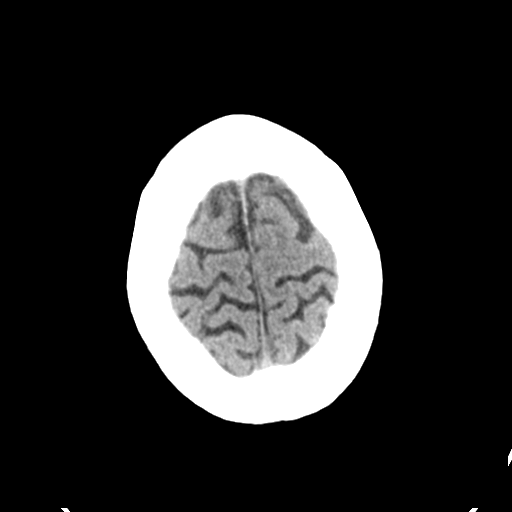
[im 28/30  brain]
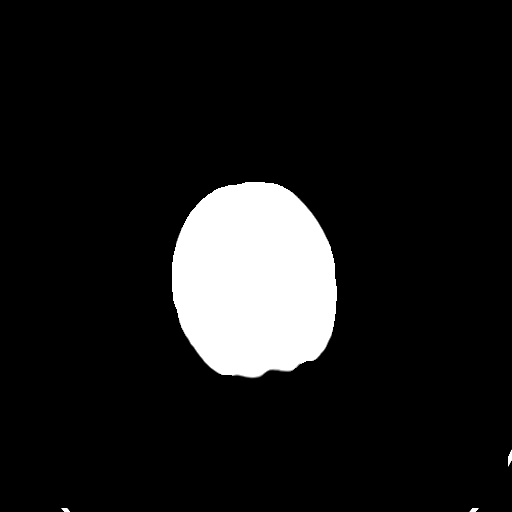
[im 28/30  bone]
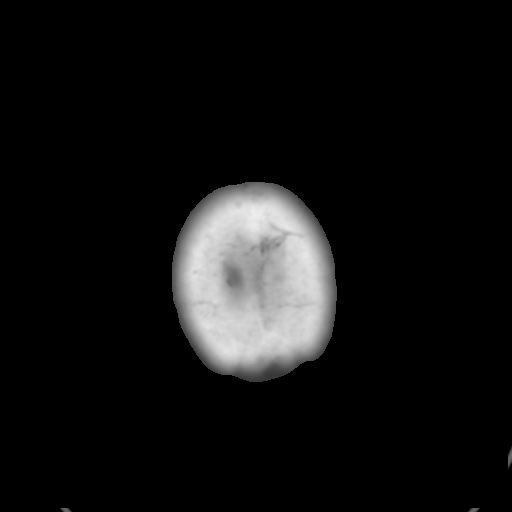

[Series 5: head 3.0 mpr cor · coronal · 0.31mm/px · 3 of 69 slices shown]
[im 23/69  brain]
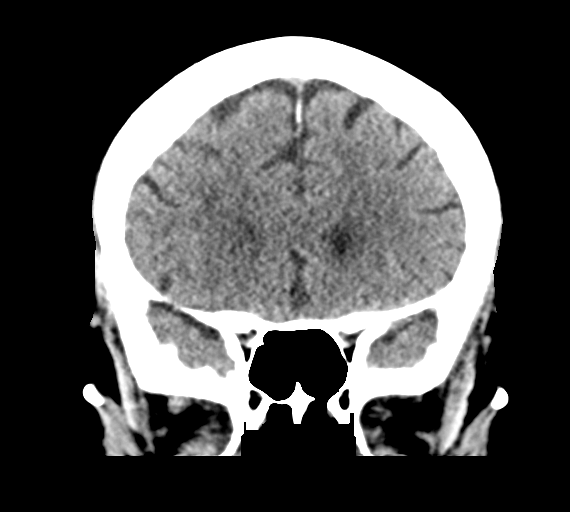
[im 31/69  brain]
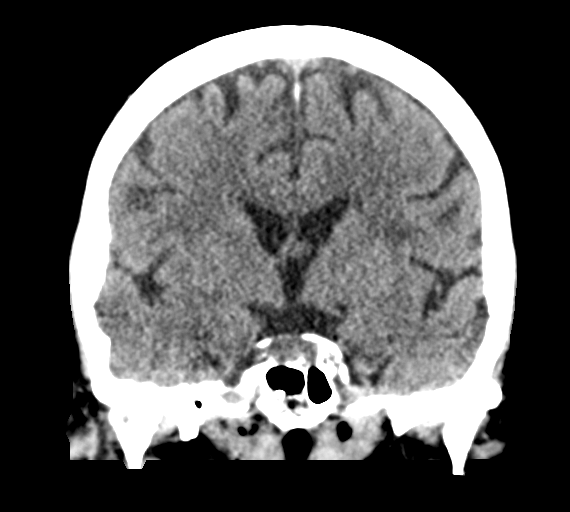
[im 38/69  brain]
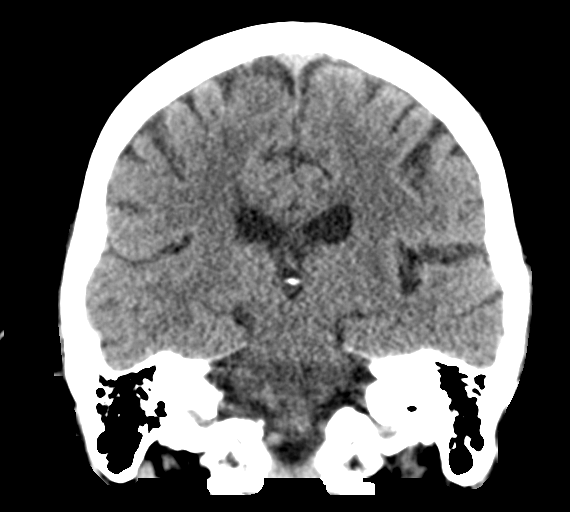

[Series 6: head 3.0 mpr sag · sagittal · 0.30mm/px · 3 of 55 slices shown]
[im 19/55  brain]
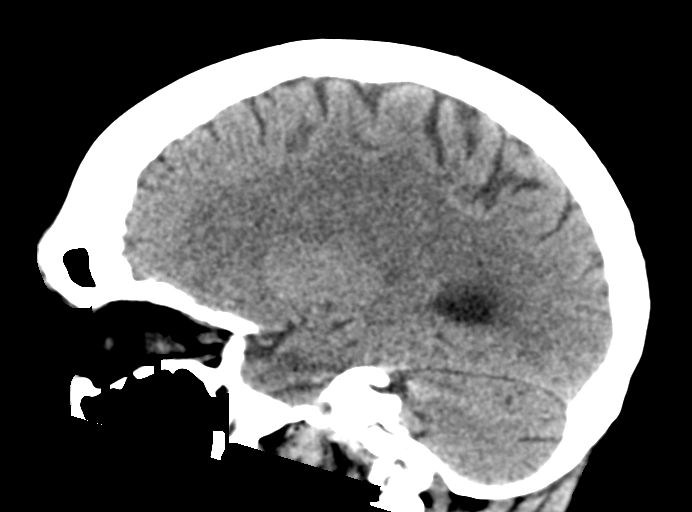
[im 28/55  brain]
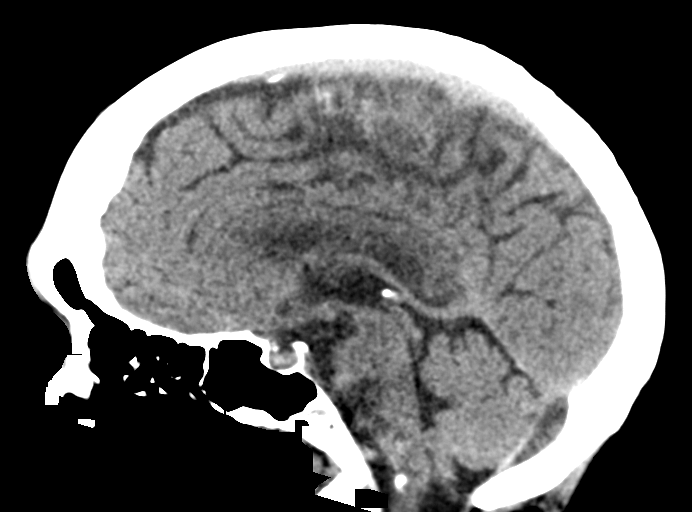
[im 37/55  brain]
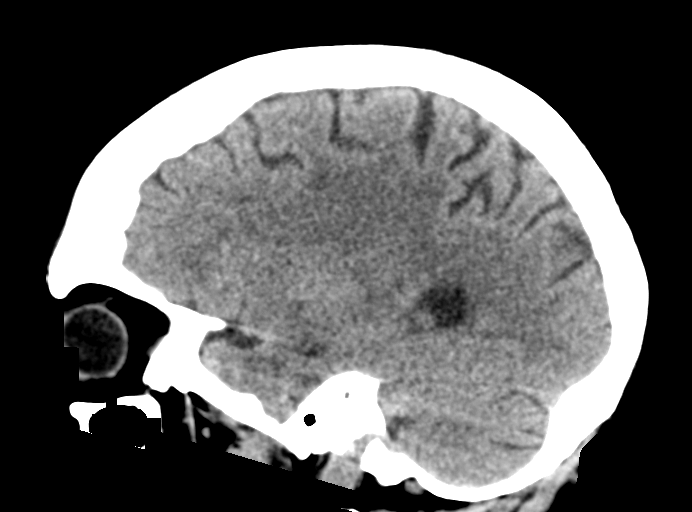

[15 of 47 positions shown; findings below may reference images not displayed]

FINDINGS: Brain: No evidence of acute infarction, hemorrhage, hydrocephalus,
extra-axial collection or mass lesion/mass effect. Patchy white
matter hypoattenuation compatible with chronic microvascular
angiopathy.

Vascular: Calcifications intradural vertebral arteries and carotid
siphons. No hyperdense vessel or unexpected calcifications.

Skull: Hyperostosis frontalis interna. Negative for fracture or
focal lesion. Scalp unremarkable.

Sinuses/Orbits: Orbits are unremarkable. Paranasal sinuses and
mastoids are predominantly clear.

Other: None.
IMPRESSION: No acute intracranial abnormality.

Chronic white matter microvascular ischemic changes.

Intracranial atherosclerosis.

## 2020-04-01 ENCOUNTER — Other Ambulatory Visit: Payer: Self-pay | Admitting: *Deleted

## 2020-04-01 NOTE — Patient Outreach (Signed)
[facility] Post- Acute Care Coordinator follow up.   Telephone call made to Mrs. Gallion [phone]. Patient identifiers confirmed. Mrs. Blumenstein endorses that she transitioned from [facility] SNF on 03/20/20.   Mrs. Counihan was active with [facility] previously. She is agreeable to re-engagement.   Mrs. Speltz reports she has follow up appointment with PCP on tomorrow 04/02/20. Also reports she has [facility] for home health services.   Will make referral to [place] for complex case management. Mrs. Poblano was active with [facility] Management prior to admission.    Marthenia Rolling, MSN, RN,BSN [place] Acute Care Coordinator [phone] [facility]) [phone]  (Toll free office)

## 2020-04-02 ENCOUNTER — Other Ambulatory Visit: Payer: Self-pay | Admitting: *Deleted

## 2020-04-02 NOTE — Patient Outreach (Signed)
[facility]) Care Management  04/02/2020  [name] 23-Feb-1941 [id number]  Initial telephone outreach post SNF discharge. Pt was hospitalized 02/17/20- 03/02/20 for GIB. Pt has med hx HF, DM, HTN, CAD, CKD stage 4, Respiratory Failure, PE, CVA Gout, Breast Ca, Left hip fx. Pt was previously followed by my co-worker, Kristen Nakayama, RN, Health Coach.  Unsuccessful, left message and requested a return call.  [name]. [name], MSN, [facility] Gerontological Nurse Practitioner [facility] Care Management [phone]

## 2020-04-04 ENCOUNTER — Other Ambulatory Visit: Payer: Self-pay | Admitting: *Deleted

## 2020-04-04 NOTE — Patient Outreach (Signed)
[facility]) Care Management  04/04/2020  Kaylon Laroche Pauling 04-13-1940 [id number]  Initial telephone outreach PAC/SNF stay.  Mrs. Bodkin has been home now for a couple of days. She is there with her husband who is a very capable caregiver.  Mrs. Livesey had a GI Bleed, admitted 11/21-12/5/21 and then 4 weeks of rehab. Her other pertinent dxs are DM, Respirtory failure, L hip fx, hx CVA, ARF/CKD4, PE.  She reports she is getting better every day. She has home health already engaged for nursing and PT.  Patient was recently discharged from hospital and all medications have been reviewed. Outpatient Encounter Medications as of 04/04/2020  Medication Sig Note  . BIOTIN PO Take 1 tablet by mouth daily.   . bumetanide (BUMEX) 2 MG tablet Take 2 mg by mouth daily. 2 in morning and 1 in afternoon   . Cholecalciferol (VITAMIN D) 50 MCG (2000 UT) tablet Take 2,000 Units by mouth daily.   . clopidogrel (PLAVIX) 75 MG tablet Take 75 mg by mouth daily.   . Coenzyme Q10 (COQ-10 PO) Take 1 tablet by mouth daily.   . Cranberry 500 MG TABS Take 500 mg by mouth every evening.   . diazepam (VALIUM) 5 MG tablet Take 5 mg by mouth every 12 (twelve) hours as needed for anxiety.   Marland Kitchen exemestane (AROMASIN) 25 MG tablet TAKE 1 TABLET DAILY AFTER BREAKFAST   . febuxostat (ULORIC) 40 MG tablet Take 40 mg by mouth daily.   Marland Kitchen gabapentin (NEURONTIN) 300 MG capsule Take 300 mg by mouth 2 (two) times daily.  02/08/2020: Had 2 doses as of 11/11  . JANUVIA 25 MG tablet Take 25 mg by mouth daily.   Javier Docker Oil 500 MG CAPS Take 500 mg by mouth daily.   . Methylfol-Methylcob-Acetylcyst (CEREFOLIN NAC) 6-2-600 MG TABS TAKE 1 TABLET BY MOUTH EVERY DAY   . metolazone (ZAROXOLYN) 2.5 MG tablet Take 2.5 mg by mouth daily. 30 minutes before Bumetanide Mon. & Thurs.   . metoprolol succinate (TOPROL-XL) 25 MG 24 hr tablet Take 25 mg by mouth at bedtime.    . mirtazapine (REMERON) 15 MG tablet Take 15 mg by mouth at bedtime.  Taking 1 1/2 tablet at bedtime 04/04/2020: This has been increased to 2 tabs 15 mg = 30 mg.  . Multiple Vitamin (MULTIVITAMIN WITH MINERALS) TABS tablet Take 1 tablet by mouth daily.   . nitroGLYCERIN (NITROLINGUAL) 0.4 MG/SPRAY spray Place 1 spray under the tongue every 5 (five) minutes x 3 doses as needed for chest pain. 02/08/2020: PT states Rx has expired at Home   . pantoprazole (PROTONIX) 40 MG tablet Take 1 tablet (40 mg total) by mouth 2 (two) times daily before a meal. 04/04/2020: Takes one daily.  . potassium chloride SA (KLOR-CON) 20 MEQ tablet Take 20 mEq by mouth 2 (two) times daily.   . pramipexole (MIRAPEX) 0.25 MG tablet TAKE 1 TO 2 TABLETS AS INSTRUCTED, 1 TABLET AT 4:00 P.M. AND 2 TABLETS AT BEDTIME 04/04/2020: Takes 3 at bedtime.  Marland Kitchen RESVERATROL-GRAPE PO Take 500 mg by mouth.   . rosuvastatin (CRESTOR) 20 MG tablet Take 20 mg by mouth daily.   . [DISCONTINUED] gabapentin (NEURONTIN) 300 MG capsule Take 1 capsule (300 mg total) by mouth 2 (two) times daily. Take 1 during the day if needed and 2 at night   . Calcium Carb-Cholecalciferol 600-800 MG-UNIT TABS Take 1 tablet by mouth daily. (Patient not taking: Reported on 04/04/2020)   . COLCRYS 0.6  MG tablet TAKE 1 TABLET DAILY AS NEEDED FOR GOUT FLARE. (Patient not taking: Reported on 04/04/2020)   . CRANBERRY-CALCIUM PO Take 1 tablet by mouth daily. (Patient not taking: Reported on 04/04/2020)   . [DISCONTINUED] alum & mag hydroxide-simeth (MAALOX/MYLANTA) 200-200-20 MG/5ML suspension Take 30 mLs by mouth every 4 (four) hours as needed for indigestion. (Patient not taking: Reported on 04/04/2020)   . [DISCONTINUED] guaiFENesin (MUCINEX) 600 MG 12 hr tablet Take 1 tablet (600 mg total) by mouth 2 (two) times daily. (Patient not taking: Reported on 04/04/2020)   . [DISCONTINUED] hydrocortisone (ANUSOL-HC) 25 MG suppository Place 1 suppository (25 mg total) rectally 2 (two) times daily as needed for anal itching. (Patient not taking: Reported on 04/04/2020)    . [DISCONTINUED] insulin aspart (NOVOLOG) 100 UNIT/ML injection Inject 0-9 Units into the skin 4 (four) times daily -  before meals and at bedtime. (Patient not taking: Reported on 04/04/2020)   . [DISCONTINUED] JANUVIA 25 MG tablet Take 25 mg by mouth daily.   . [DISCONTINUED] KRILL OIL PO Take 1 capsule by mouth daily.   . [DISCONTINUED] Methylfol-Methylcob-Acetylcyst (CEREFOLIN NAC) 6-2-600 MG TABS Take 1 tablet by mouth in the morning.   . [DISCONTINUED] metolazone (ZAROXOLYN) 2.5 MG tablet TAKE 1 TABLET DAILY 30 MINUTES BEFORE THE MORNING DOSE OF BUMEX AS DIRECTED   . [DISCONTINUED] metoprolol succinate (TOPROL-XL) 25 MG 24 hr tablet Take 12.5 mg by mouth daily.   . [DISCONTINUED] mirtazapine (REMERON) 15 MG tablet Take 15 mg by mouth at bedtime.    . [DISCONTINUED] nitroGLYCERIN (NITROSTAT) 0.4 MG SL tablet Place 0.4 mg under the tongue every 5 (five) minutes as needed for chest pain.   . [DISCONTINUED] sennosides-docusate sodium (SENOKOT-S) 8.6-50 MG tablet Take 1 tablet by mouth daily as needed for constipation. (Patient not taking: Reported on 04/04/2020)   . [DISCONTINUED] Ubiquinol (QUNOL COQ10/UBIQUINOL/MEGA) 100 MG CAPS Take 100 mg by mouth every evening. CoQ10, Qunol     No facility-administered encounter medications on file as of 04/04/2020.   Her current med list in the system had numerous duplications and needed many edits. Her husband advised me of what other meds she is taking. The list above is what pt is ACTUALLY taking. If there are descrepencies please address them them as Mr. Badley if very protective and serious about his role in being the "medicine man."  Fall Risk  04/04/2020 11/20/2019 06/19/2019 03/19/2019 01/16/2019  Falls in the past year? 1 1 - 1 1  Number falls in past yr: 1 1 1 1 1   Comment - - 3 falls since last outreach with no injury - -  Injury with Fall? 1 0 0 0 1  Risk for fall due to : History of fall(s);Impaired balance/gait;Impaired mobility;Medication side  effect;Orthopedic patient History of fall(s);Impaired balance/gait;Impaired mobility History of fall(s);Impaired balance/gait;Impaired mobility History of fall(s);Impaired balance/gait;Impaired mobility History of fall(s);Impaired balance/gait;Impaired mobility  Risk for fall due to: Comment - - ortho condition is causing feet to unbalanced - -  Follow up Falls evaluation completed;Education provided;Falls prevention discussed Falls evaluation completed Falls evaluation completed;Falls prevention discussed;Education provided Falls evaluation completed;Falls prevention discussed;Education provided Falls evaluation completed;Education provided;Falls prevention discussed   Did not assess depression today.  Priority goals set today Goals Addressed            This Visit's Progress   . Make and Keep All Appointments       Timeframe:  Short-Term Goal Priority:  High Start Date:     04/04/20  Expected End Date:   05/09/20                    Follow Up Date 05/09/20  - call to cancel if needed - keep a calendar with appointment dates    Why is this important?    Part of staying healthy is seeing the doctor for follow-up care.   If you forget your appointments, there are some things you can do to stay on track.    Notes:     Marland Kitchen Matintain My Quality of Life       Timeframe:  Long-Range Goal Priority:  High Start Date:       1//722                      Expected End Date:    06/26/20                   Follow Up Date 05/09/20   - complete a living will - do one enjoyable thing every day - do something different, like talking to a new person or going to a new place, every day - learn something new by asking, reading and searching the Internet every day - meditate daily - spend time outdoors at least 3 times a week    Why is this important?    Having a long-term illness can be scary.   It can also be stressful for you and your caregiver.   These steps may help.     Notes:     . Prevent Falls and Injury       Follow Up Date 05/09/20  - always use handrails on the stairs - always wear low-heeled or flat shoes or slippers with nonskid soles - call the doctor if I am feeling too drowsy - install bathroom grab bars - keep a flashlight by the bed - keep my cell phone with me always - make an emergency alert plan in case I fall - pick up clutter from the floors - use a nonslip pad with throw rugs, or remove them completely - use a cane or walker - use a nightlight in the bathroom    Why is this important?    Most falls happen when it is hard for you to walk safely. Your balance may be off because of an illness. You may have pain in your knees, hip or other joints.   You may be overly tired or taking medicines that make you sleepy. You may not be able to see or hear clearly.   Falls can lead to broken bones, bruises or other injuries.   There are things you can do to help prevent falling.     Notes:     . Protect My Health       Timeframe:  Long-Range Goal Priority:  High Start Date:       04/04/20                      Expected End Date:      06/26/20                 Follow Up Date 05/09/20  - schedule recommended health tests (blood work, mammogram, colonoscopy, pap test) - schedule and keep appointment for annual check-up    Why is this important?    Screening tests can find diseases early when they are easier to treat.   Your doctor or nurse  will talk with you about which tests are important for you.   Getting shots for common diseases like the flu and shingles will help prevent them.     Notes:       We will talk again in one week.  [name]. [name], MSN, [facility] Gerontological Nurse Practitioner [facility] Care Management [phone]

## 2020-04-10 ENCOUNTER — Other Ambulatory Visit: Payer: Self-pay | Admitting: *Deleted

## 2020-04-10 NOTE — Patient Outreach (Signed)
[facility]) Care Management  04/10/2020  [name] February 20, 1941 [id number]  Telephone outreach for follow up multiple co-morbidities, Unsuccessful, left message on pt cell phone and requested a return call.  I will call next week if I do not hear back from her.  [name]. [name], MSN, [facility] Gerontological Nurse Practitioner [facility] Care Management [phone]

## 2020-04-16 ENCOUNTER — Other Ambulatory Visit: Payer: Self-pay | Admitting: *Deleted

## 2020-04-16 ENCOUNTER — Encounter: Payer: Self-pay | Admitting: *Deleted

## 2020-04-16 NOTE — Patient Outreach (Signed)
[facility]) Care Management  04/16/2020  [name] 03-01-1941 [id number]  Transition of care call #3 completed. Initial assessment components completed.  [name] is progressing as hoped. No new problems.  Goals Addressed            This Visit's Progress   . Make and Keep All Appointments   On track    Timeframe:  Short-Term Goal Priority:  High Start Date:     04/04/20                        Expected End Date:   05/09/20                    Follow Up Date 05/09/20  - call to cancel if needed - keep a calendar with appointment dates    Why is this important?    Part of staying healthy is seeing the doctor for follow-up care.   If you forget your appointments, there are some things you can do to stay on track.    Notes:     Marland Kitchen Matintain My Quality of Life   On track    Timeframe:  Long-Range Goal Priority:  High Start Date:       1//722                      Expected End Date:    06/26/20                   Follow Up Date 05/09/20   - complete a living will - do one enjoyable thing every day - do something different, like talking to a new person or going to a new place, every day - learn something new by asking, reading and searching the Internet every day - meditate daily - spend time outdoors at least 3 times a week    Why is this important?    Having a long-term illness can be scary.   It can also be stressful for you and your caregiver.   These steps may help.    Notes:     . Prevent Falls and Injury   On track    Follow Up Date 05/09/20  - always use handrails on the stairs - always wear low-heeled or flat shoes or slippers with nonskid soles - call the doctor if I am feeling too drowsy - install bathroom grab bars - keep a flashlight by the bed - keep my cell phone with me always - make an emergency alert plan in case I fall - pick up clutter from the floors - use a nonslip pad with throw rugs, or remove them completely - use a  cane or walker - use a nightlight in the bathroom    Why is this important?    Most falls happen when it is hard for you to walk safely. Your balance may be off because of an illness. You may have pain in your knees, hip or other joints.   You may be overly tired or taking medicines that make you sleepy. You may not be able to see or hear clearly.   Falls can lead to broken bones, bruises or other injuries.   There are things you can do to help prevent falling.     Notes: 04/16/20 No falls or injuries since she has returned home from rehab. Participating in PT/OT. Reports she is making good progress but  hopes to continue with the therapies because she is not where she would like to be yet. Fall prevention discussed and is reinforced with her therapies.    . Protect My Health   On track    Timeframe:  Long-Range Goal Priority:  High Start Date:       04/04/20                      Expected End Date:      06/26/20                 Follow Up Date 05/09/20  - schedule recommended health tests (blood work, mammogram, colonoscopy, pap test) - schedule and keep appointment for annual check-up    Why is this important?    Screening tests can find diseases early when they are easier to treat.   Your doctor or nurse will talk with you about which tests are important for you.   Getting shots for common diseases like the flu and shingles will help prevent them.     Notes: 04/16/20 Had to cancel an appt last week, did not feel well. She does have it rescheduled. This is not an issue for her. Reviewed vacs which are up to date. Eye exam and dental exams are due. Pt to make appts.      We agreed to talk again next week.  [name]. Kristen Neither, MSN, [facility] Gerontological Nurse Practitioner [facility] Care Management [phone]

## 2020-04-20 DIAGNOSIS — J40 Bronchitis, not specified as acute or chronic: Secondary | ICD-10-CM | POA: Diagnosis not present

## 2020-04-20 DIAGNOSIS — Z7902 Long term (current) use of antithrombotics/antiplatelets: Secondary | ICD-10-CM | POA: Diagnosis not present

## 2020-04-20 DIAGNOSIS — B3781 Candidal esophagitis: Secondary | ICD-10-CM | POA: Diagnosis not present

## 2020-04-20 DIAGNOSIS — I2510 Atherosclerotic heart disease of native coronary artery without angina pectoris: Secondary | ICD-10-CM | POA: Diagnosis not present

## 2020-04-20 DIAGNOSIS — S065X0D Traumatic subdural hemorrhage without loss of consciousness, subsequent encounter: Secondary | ICD-10-CM | POA: Diagnosis not present

## 2020-04-20 DIAGNOSIS — E785 Hyperlipidemia, unspecified: Secondary | ICD-10-CM | POA: Diagnosis not present

## 2020-04-20 DIAGNOSIS — W19XXXD Unspecified fall, subsequent encounter: Secondary | ICD-10-CM | POA: Diagnosis not present

## 2020-04-20 DIAGNOSIS — C50211 Malignant neoplasm of upper-inner quadrant of right female breast: Secondary | ICD-10-CM | POA: Diagnosis not present

## 2020-04-20 DIAGNOSIS — I2699 Other pulmonary embolism without acute cor pulmonale: Secondary | ICD-10-CM | POA: Diagnosis not present

## 2020-04-20 DIAGNOSIS — E1165 Type 2 diabetes mellitus with hyperglycemia: Secondary | ICD-10-CM | POA: Diagnosis not present

## 2020-04-20 DIAGNOSIS — Z170 Estrogen receptor positive status [ER+]: Secondary | ICD-10-CM | POA: Diagnosis not present

## 2020-04-20 DIAGNOSIS — Z7984 Long term (current) use of oral hypoglycemic drugs: Secondary | ICD-10-CM | POA: Diagnosis not present

## 2020-04-20 DIAGNOSIS — E1122 Type 2 diabetes mellitus with diabetic chronic kidney disease: Secondary | ICD-10-CM | POA: Diagnosis not present

## 2020-04-20 DIAGNOSIS — I890 Lymphedema, not elsewhere classified: Secondary | ICD-10-CM | POA: Diagnosis not present

## 2020-04-20 DIAGNOSIS — E559 Vitamin D deficiency, unspecified: Secondary | ICD-10-CM | POA: Diagnosis not present

## 2020-04-20 DIAGNOSIS — R911 Solitary pulmonary nodule: Secondary | ICD-10-CM | POA: Diagnosis not present

## 2020-04-20 DIAGNOSIS — D62 Acute posthemorrhagic anemia: Secondary | ICD-10-CM | POA: Diagnosis not present

## 2020-04-20 DIAGNOSIS — J9601 Acute respiratory failure with hypoxia: Secondary | ICD-10-CM | POA: Diagnosis not present

## 2020-04-20 DIAGNOSIS — I959 Hypotension, unspecified: Secondary | ICD-10-CM | POA: Diagnosis not present

## 2020-04-20 DIAGNOSIS — M1A0720 Idiopathic chronic gout, left ankle and foot, without tophus (tophi): Secondary | ICD-10-CM | POA: Diagnosis not present

## 2020-04-20 DIAGNOSIS — S72042D Displaced fracture of base of neck of left femur, subsequent encounter for closed fracture with routine healing: Secondary | ICD-10-CM | POA: Diagnosis not present

## 2020-04-20 DIAGNOSIS — I5032 Chronic diastolic (congestive) heart failure: Secondary | ICD-10-CM | POA: Diagnosis not present

## 2020-04-20 DIAGNOSIS — N184 Chronic kidney disease, stage 4 (severe): Secondary | ICD-10-CM | POA: Diagnosis not present

## 2020-04-20 DIAGNOSIS — C50412 Malignant neoplasm of upper-outer quadrant of left female breast: Secondary | ICD-10-CM | POA: Diagnosis not present

## 2020-04-20 DIAGNOSIS — G2581 Restless legs syndrome: Secondary | ICD-10-CM | POA: Diagnosis not present

## 2020-04-20 IMAGING — MG DIGITAL DIAGNOSTIC BILATERAL MAMMOGRAM WITH TOMO AND CAD
8 of 12 series · 9 of 32 positions shown · non-contrast
Comparison: Previous exam(s).

CLINICAL DATA: 78-year-old patient presents for annual mammogram.
History of bilateral lumpectomies in 2707.

EXAM:
DIGITAL DIAGNOSTIC BILATERAL MAMMOGRAM WITH CAD AND TOMO

[L MLO]
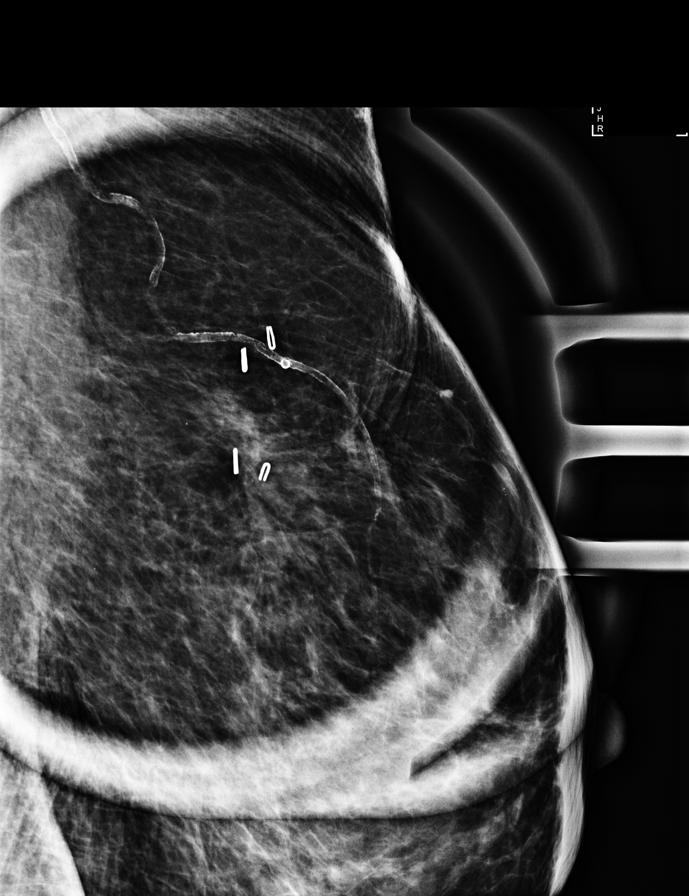

[R MLO]
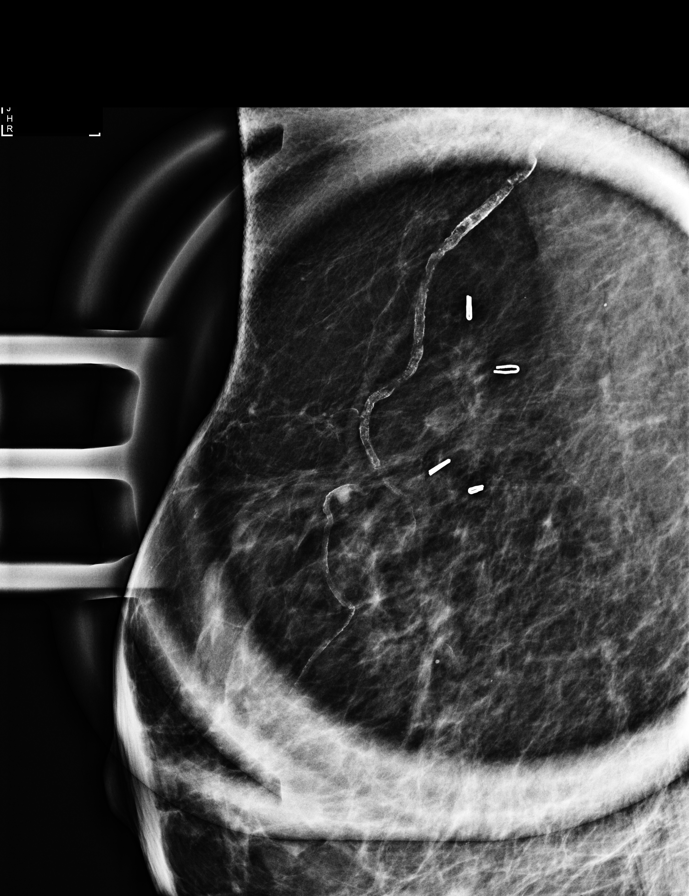

[R CC synth-2D]
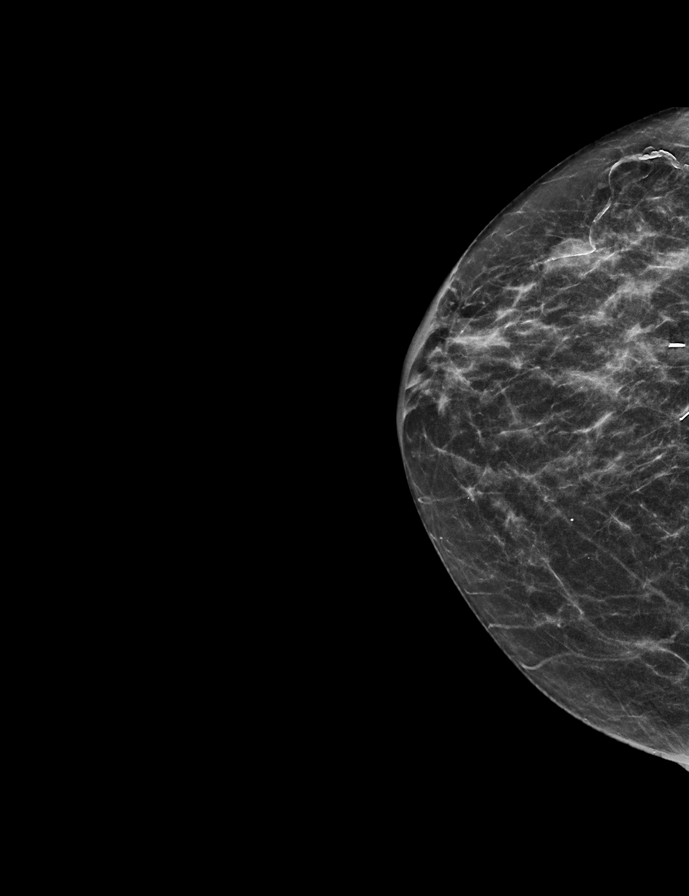

[L MLO synth-2D]
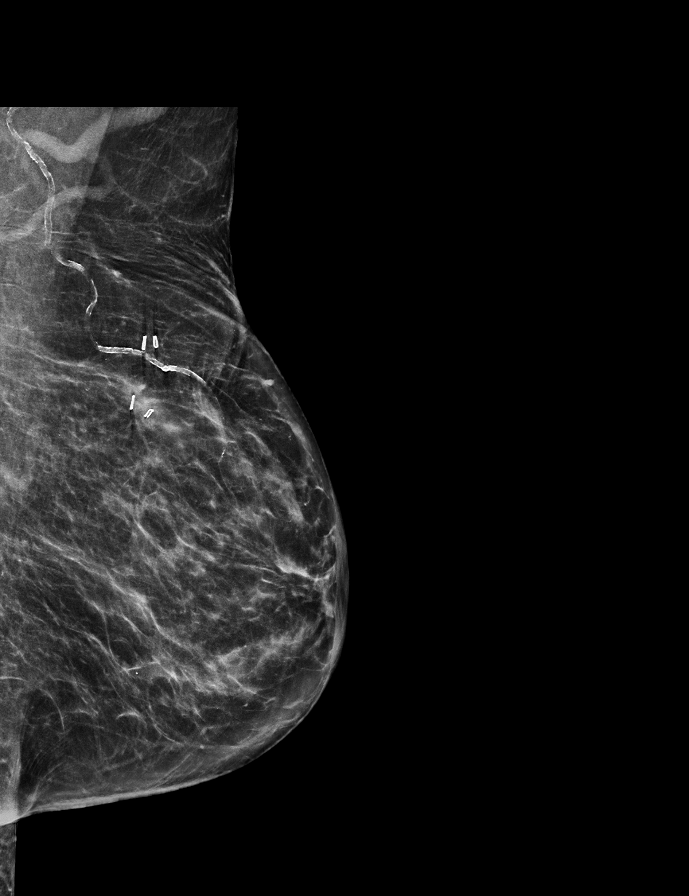

[R XCCL synth-2D]
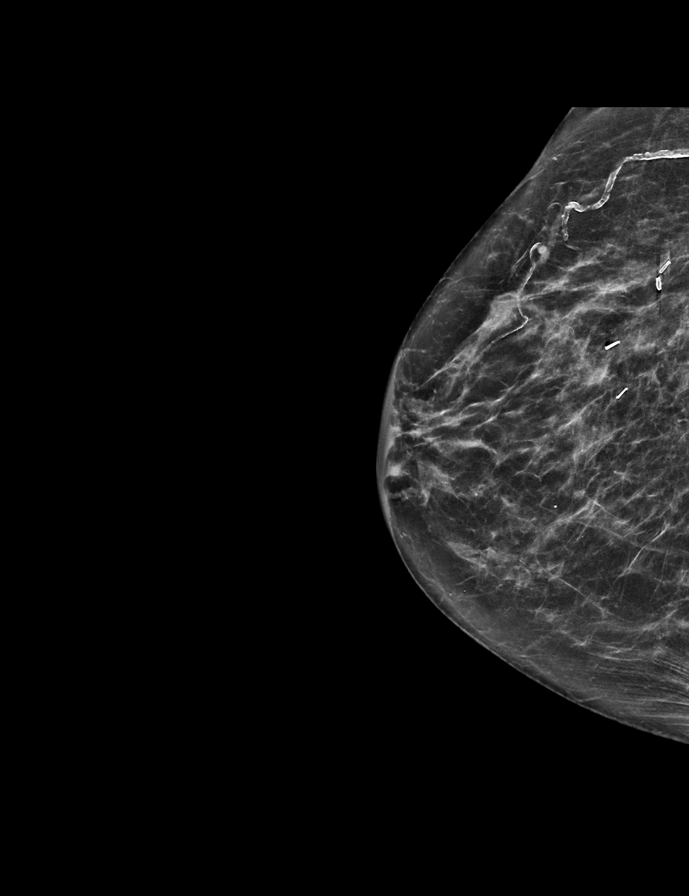

[R MLO synth-2D]
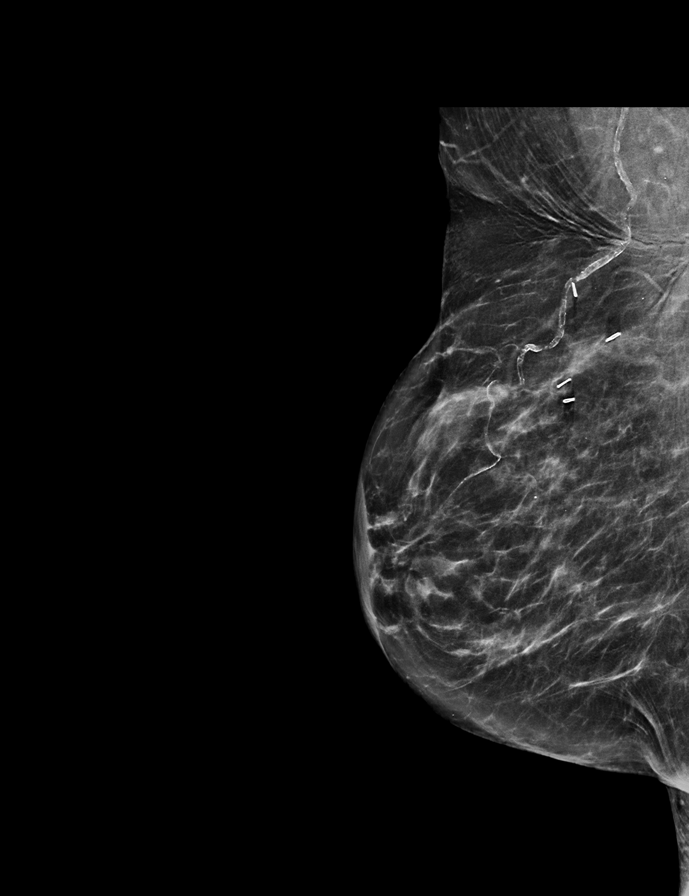

[L CC synth-2D]
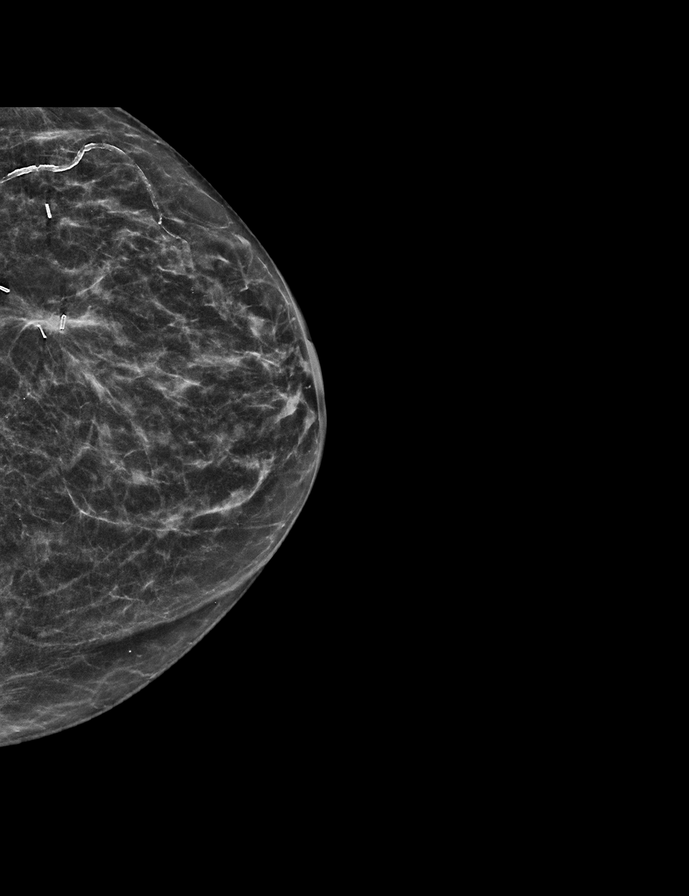

[R MLO tomo · 2 of 62 frames shown]
[frame 21/62]
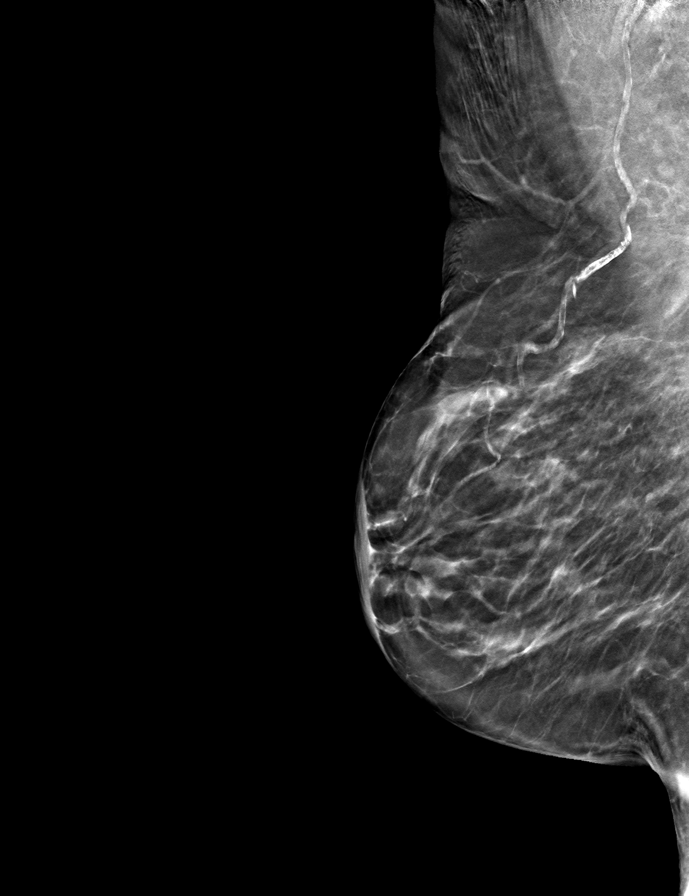
[frame 31/62]
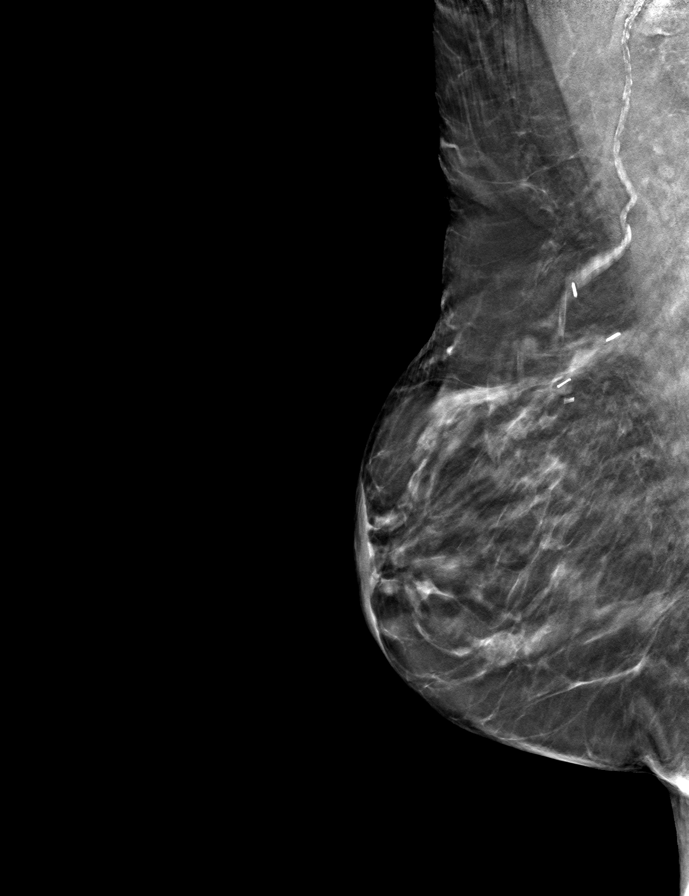

[9 of 32 positions shown; findings below may reference images not displayed]

ACR Breast Density Category c: The breast tissue is heterogeneously
dense, which may obscure small masses.
FINDINGS: Bilateral lumpectomy changes in the superior breasts. No mass,
nonsurgical distortion, or suspicious microcalcification is
identified in either breast to suggest malignancy. Atherosclerotic
vascular calcifications are seen bilaterally.

Mammographic images were processed with CAD.
IMPRESSION: No evidence of malignancy in either breast. Lumpectomy changes in
both breasts.

RECOMMENDATION:
Diagnostic mammogram is suggested in 1 year. (Code:[id number])

I have discussed the findings and recommendations with the patient.
Results were also provided in writing at the conclusion of the
visit. If applicable, a reminder letter will be sent to the patient
regarding the next appointment.

BI-RADS CATEGORY  2: Benign.

## 2020-04-23 ENCOUNTER — Other Ambulatory Visit: Payer: Self-pay | Admitting: *Deleted

## 2020-04-23 NOTE — Patient Outreach (Signed)
[facility]) Care Management  04/23/2020  Jeanae Whitmill Matlock 09-03-1940 [id number]  Transition of care call #3. Unsuccessful, left message on home and cell phone #s and requested a return call.  [name]. [name], MSN, [facility] Gerontological Nurse Practitioner [facility] Care Management [phone]

## 2020-04-30 ENCOUNTER — Other Ambulatory Visit: Payer: Self-pay | Admitting: *Deleted

## 2020-04-30 DIAGNOSIS — E1122 Type 2 diabetes mellitus with diabetic chronic kidney disease: Secondary | ICD-10-CM

## 2020-04-30 DIAGNOSIS — N1832 Chronic kidney disease, stage 3b: Secondary | ICD-10-CM

## 2020-04-30 DIAGNOSIS — S72145D Nondisplaced intertrochanteric fracture of left femur, subsequent encounter for closed fracture with routine healing: Secondary | ICD-10-CM

## 2020-04-30 NOTE — Patient Outreach (Signed)
[facility]) Care Management  04/30/2020  Khristin Keleher [place] 1940-05-14 [id number]   Telephone outreach to follow up on pt ongoing physical progress.  Goals Addressed            This Visit's Progress   . Make and Keep All Appointments   Not on track    Timeframe:  Short-Term Goal Priority:  High Start Date:     04/30/20                   Expected End Date:   06/10/20            Follow Up Date 05/09/20  - call to cancel if needed - keep a calendar with appointment dates    Why is this important?    Part of staying healthy is seeing the doctor for follow-up care.   If you forget your appointments, there are some things you can do to stay on track.    Notes: 04/30/20 Pt did cancel an appt with her endocrinologist. She plans on seeking another doctor. Encouraged to not delay doing so, as her diabetes management should be a priority for her health. We have extended this goal so she an an opportunity to schedule an eye exam, a foot exam and a new endocrinologist.     . Matintain My Quality of Life as evidenced by equal or improved QOL score at the end of March   On track    Timeframe:  Long-Range Goal Priority:  High Start Date:       1//722                      Expected End Date:    06/26/20                   Follow Up Date 06/05/20   - complete a living will - do one enjoyable thing every day - do something different, like talking to a new person or going to a new place, every day - learn something new by asking, reading and searching the Internet every day - meditate daily - spend time outdoors at least 3 times a week    Why is this important?    Having a long-term illness can be scary.   It can also be stressful for you and your caregiver.   These steps may help.    Notes: 04/30/20 Received advanced directives. Encouraged to complete, get notorized and make copies for self, MD and those involved with decision making. Discuss emergent scenarios with family and  advise of desires.    . Prevent Falls and Injury as evidenced by no ED visits by the 06/05/20   On track    Follow Up Date 06/05/20  - always use handrails on the stairs - always wear low-heeled or flat shoes or slippers with nonskid soles - call the doctor if I am feeling too drowsy - install bathroom grab bars - keep a flashlight by the bed - keep my cell phone with me always - make an emergency alert plan in case I fall - pick up clutter from the floors - use a nonslip pad with throw rugs, or remove them completely - use a cane or walker - use a nightlight in the bathroom    Why is this important?    Most falls happen when it is hard for you to walk safely. Your balance may be off because of an illness. You may  have pain in your knees, hip or other joints.   You may be overly tired or taking medicines that make you sleepy. You may not be able to see or hear clearly.   Falls can lead to broken bones, bruises or other injuries.   There are things you can do to help prevent falling.     Notes: 04/16/20 No falls or injuries since she has returned home from rehab. Participating in PT/OT. Reports she is making good progress but hopes to continue with the therapies because she is not where she would like to be yet. Fall prevention discussed and is reinforced with her therapies. 04/30/20 No falls. Pt is very conscientious about her mobility. Has completed OT but continues PT.    . Protect My Health as evidenced by closing all care gaps by 06/26/20.   On track    Timeframe:  Long-Range Goal Priority:  High Start Date:       04/04/20                      Expected End Date:      06/26/20                 Follow Up Date 06/05/20 - schedule recommended health tests (blood work, mammogram, colonoscopy, pap test) - schedule and keep appointment for annual check-up    Why is this important?    Screening tests can find diseases early when they are easier to treat.   Your doctor or nurse will talk  with you about which tests are important for you.   Getting shots for common diseases like the flu and shingles will help prevent them.     Notes: 04/16/20 Had to cancel an appt last week, did not feel well. She does have it rescheduled. This is not an issue for her. Reviewed vacs which are up to date. Eye exam and dental exams are due. Pt to make appts. 04/30/20 Pt did not make appts. Encouraged that doing these self maintenance tasks are part of Medicare required tasks for members. They are important to avoid complications. She did have Hgb A1C in November which was high 8.8. Today she reports her FBS and NFBS are <160. NP called primary to verify if she has had pneumonia, flu and Td vaccines. Verified her 3 Moderna Covid vaccines today.       We agreed to talk in one month.  [name]. [name], MSN, [facility] Gerontological Nurse Practitioner [facility] Care Management [phone]

## 2020-05-01 NOTE — Addendum Note (Signed)
Addended by: Deloria Lair on: 05/01/2020 09:56 AM   Modules accepted: Orders

## 2020-05-02 ENCOUNTER — Telehealth: Payer: Self-pay | Admitting: General Practice

## 2020-05-02 NOTE — Telephone Encounter (Signed)
   Telephone encounter was:  Unsuccessful.  05/02/2020 Name: [name] MRN: [id number] DOB: [birth year]  Unsuccessful outbound call made today to assist with:  Food Insecurity  Outreach Attempt:  1st Attempt  A HIPAA compliant voice message was left requesting a return call.  Instructed patient to call back at [phone].  [place], Care Management Phone: [phone] Email: [email or web address]

## 2020-05-08 ENCOUNTER — Telehealth: Payer: Self-pay | Admitting: General Practice

## 2020-05-08 NOTE — Telephone Encounter (Signed)
   Telephone encounter was:  Unsuccessful.  05/08/2020 Name: [name] MRN: [id number] DOB: May 19, 1940  Unsuccessful outbound call made today to assist with:  patient wants information on MOM's meals for her and husband according to the referring note.   Outreach Attempt:  2nd Attempt  A HIPAA compliant voice message was left requesting a return call.  Instructed patient to call back at [phone].  [place], Care Management Phone: [phone] Email: [email or web address]

## 2020-05-09 ENCOUNTER — Telehealth: Payer: Self-pay | Admitting: General Practice

## 2020-05-09 NOTE — Telephone Encounter (Signed)
   Telephone encounter was:  Successful.  05/09/2020 Name: [name] MRN: [id number] DOB: Feb 16, 1941  [name] is a 80 y.o. year old female who is a primary care patient of [name], DO . The community resource team was consulted for assistance with Need food preperations  Care guide performed the following interventions: Patient provided with information about care guide support team and interviewed to confirm resource needs Investigation of community resources performed Discussed resources to assist with in home care including food preparations. Found out that her husband [name] is a Chief Financial Officer and that he is getting his [employer] retirement but not any of the disability that he deserves. We discussed connecting him to the [facility] and getting them set up with them. They will get him the disability status that he has earned fighting in the [place] war (lost his hearing and kidney/galblbladder) and with the disability will come the needed help in the house, like aide and assistance for patient and spouse.  Placed referral to [facility] via email to Chryl Heck the [place]. .  Follow Up Plan:  Care guide will follow up with patient by phone over the next week to make sure that the [facility] has reached out to them and Care guide will outreach resources to assist patient with MOM's meals until the [place] can help them. Patient has dietary needs and husband has diverticulitis and need special meal plans. They are not doing well enough to cook for themselves much these days and eating out isn't healthy for either of them.    [place], Care Management Phone: [phone] Email: [email or web address]

## 2020-05-20 ENCOUNTER — Other Ambulatory Visit: Payer: Self-pay

## 2020-05-20 DIAGNOSIS — N184 Chronic kidney disease, stage 4 (severe): Secondary | ICD-10-CM | POA: Diagnosis not present

## 2020-05-20 DIAGNOSIS — I2510 Atherosclerotic heart disease of native coronary artery without angina pectoris: Secondary | ICD-10-CM | POA: Diagnosis not present

## 2020-05-20 DIAGNOSIS — C50412 Malignant neoplasm of upper-outer quadrant of left female breast: Secondary | ICD-10-CM | POA: Diagnosis not present

## 2020-05-20 DIAGNOSIS — D62 Acute posthemorrhagic anemia: Secondary | ICD-10-CM | POA: Diagnosis not present

## 2020-05-20 DIAGNOSIS — I959 Hypotension, unspecified: Secondary | ICD-10-CM | POA: Diagnosis not present

## 2020-05-20 DIAGNOSIS — E785 Hyperlipidemia, unspecified: Secondary | ICD-10-CM | POA: Diagnosis not present

## 2020-05-20 DIAGNOSIS — Z7902 Long term (current) use of antithrombotics/antiplatelets: Secondary | ICD-10-CM | POA: Diagnosis not present

## 2020-05-20 DIAGNOSIS — G2581 Restless legs syndrome: Secondary | ICD-10-CM | POA: Diagnosis not present

## 2020-05-20 DIAGNOSIS — J9601 Acute respiratory failure with hypoxia: Secondary | ICD-10-CM | POA: Diagnosis not present

## 2020-05-20 DIAGNOSIS — C50211 Malignant neoplasm of upper-inner quadrant of right female breast: Secondary | ICD-10-CM | POA: Diagnosis not present

## 2020-05-20 DIAGNOSIS — R911 Solitary pulmonary nodule: Secondary | ICD-10-CM | POA: Diagnosis not present

## 2020-05-20 DIAGNOSIS — Z7984 Long term (current) use of oral hypoglycemic drugs: Secondary | ICD-10-CM | POA: Diagnosis not present

## 2020-05-20 DIAGNOSIS — S065X0D Traumatic subdural hemorrhage without loss of consciousness, subsequent encounter: Secondary | ICD-10-CM | POA: Diagnosis not present

## 2020-05-20 DIAGNOSIS — E1122 Type 2 diabetes mellitus with diabetic chronic kidney disease: Secondary | ICD-10-CM | POA: Diagnosis not present

## 2020-05-20 DIAGNOSIS — J40 Bronchitis, not specified as acute or chronic: Secondary | ICD-10-CM | POA: Diagnosis not present

## 2020-05-20 DIAGNOSIS — S72042D Displaced fracture of base of neck of left femur, subsequent encounter for closed fracture with routine healing: Secondary | ICD-10-CM | POA: Diagnosis not present

## 2020-05-20 DIAGNOSIS — I5032 Chronic diastolic (congestive) heart failure: Secondary | ICD-10-CM | POA: Diagnosis not present

## 2020-05-20 DIAGNOSIS — E559 Vitamin D deficiency, unspecified: Secondary | ICD-10-CM | POA: Diagnosis not present

## 2020-05-20 DIAGNOSIS — M1A0720 Idiopathic chronic gout, left ankle and foot, without tophus (tophi): Secondary | ICD-10-CM | POA: Diagnosis not present

## 2020-05-20 DIAGNOSIS — Z170 Estrogen receptor positive status [ER+]: Secondary | ICD-10-CM | POA: Diagnosis not present

## 2020-05-20 DIAGNOSIS — Z9181 History of falling: Secondary | ICD-10-CM | POA: Diagnosis not present

## 2020-05-20 DIAGNOSIS — I890 Lymphedema, not elsewhere classified: Secondary | ICD-10-CM | POA: Diagnosis not present

## 2020-05-20 DIAGNOSIS — E1165 Type 2 diabetes mellitus with hyperglycemia: Secondary | ICD-10-CM | POA: Diagnosis not present

## 2020-05-20 DIAGNOSIS — I2699 Other pulmonary embolism without acute cor pulmonale: Secondary | ICD-10-CM | POA: Diagnosis not present

## 2020-05-20 DIAGNOSIS — W19XXXD Unspecified fall, subsequent encounter: Secondary | ICD-10-CM | POA: Diagnosis not present

## 2020-05-20 MED ORDER — EXEMESTANE 25 MG PO TABS: ORAL_TABLET | ORAL | 3 refills | Status: DC

## 2020-05-21 ENCOUNTER — Telehealth: Payer: Self-pay | Admitting: General Practice

## 2020-05-21 NOTE — Telephone Encounter (Signed)
   Telephone encounter was:  Successful.  05/21/2020 Name: [name] MRN: [id number] DOB: [birth year]  [name] is a 80 y.o. year old female who is a primary care patient of [name], DO . The community resource team was consulted for assistance with [place] guide performed the following interventions: Follow up call placed to community resources to determine status of patients referral Follow up call placed to the patient to discuss status of referral They have been receiving the meals from [facility] and they have been contacted by the [facility] home to help them further with disability pay and in home care. .  Follow Up Plan:  No further follow up planned at this time. The patient has been provided with needed resources.  [place], Care Management Phone: [phone] Email: [email or web address]

## 2020-05-26 DIAGNOSIS — I129 Hypertensive chronic kidney disease with stage 1 through stage 4 chronic kidney disease, or unspecified chronic kidney disease: Secondary | ICD-10-CM | POA: Diagnosis not present

## 2020-05-26 DIAGNOSIS — N189 Chronic kidney disease, unspecified: Secondary | ICD-10-CM | POA: Diagnosis not present

## 2020-05-26 DIAGNOSIS — D631 Anemia in chronic kidney disease: Secondary | ICD-10-CM | POA: Diagnosis not present

## 2020-05-26 DIAGNOSIS — N1832 Chronic kidney disease, stage 3b: Secondary | ICD-10-CM | POA: Diagnosis not present

## 2020-05-26 DIAGNOSIS — M25552 Pain in left hip: Secondary | ICD-10-CM | POA: Diagnosis not present

## 2020-05-26 DIAGNOSIS — N2581 Secondary hyperparathyroidism of renal origin: Secondary | ICD-10-CM | POA: Diagnosis not present

## 2020-05-28 ENCOUNTER — Other Ambulatory Visit: Payer: Self-pay | Admitting: *Deleted

## 2020-05-28 NOTE — Patient Outreach (Signed)
[facility]) Care Management  [place]  05/28/2020   [name] [birth year] [id number]  Subjective: Telephone outreach to follow up on gap closures, falls - UNSUCCESSFUL.  Objective:   Encounter Medications:  Outpatient Encounter Medications as of 05/28/2020  Medication Sig Note  . BIOTIN PO Take 1 tablet by mouth daily.   . bumetanide (BUMEX) 2 MG tablet Take 2 mg by mouth daily. 2 in morning and 1 in afternoon   . Calcium Carb-Cholecalciferol 600-800 MG-UNIT TABS Take 1 tablet by mouth daily. (Patient not taking: Reported on 04/04/2020)   . Cholecalciferol (VITAMIN D) 50 MCG (2000 UT) tablet Take 2,000 Units by mouth daily.   . clopidogrel (PLAVIX) 75 MG tablet Take 75 mg by mouth daily.   . Coenzyme Q10 (COQ-10 PO) Take 1 tablet by mouth daily.   Marland Kitchen COLCRYS 0.6 MG tablet TAKE 1 TABLET DAILY AS NEEDED FOR GOUT FLARE. (Patient not taking: Reported on 04/04/2020)   . Cranberry 500 MG TABS Take 500 mg by mouth every evening.   Marland Kitchen CRANBERRY-CALCIUM PO Take 1 tablet by mouth daily. (Patient not taking: Reported on 04/04/2020)   . diazepam (VALIUM) 5 MG tablet Take 5 mg by mouth every 12 (twelve) hours as needed for anxiety.   Marland Kitchen exemestane (AROMASIN) 25 MG tablet TAKE 1 TABLET DAILY AFTER BREAKFAST   . febuxostat (ULORIC) 40 MG tablet Take 40 mg by mouth daily.   Marland Kitchen gabapentin (NEURONTIN) 300 MG capsule Take 300 mg by mouth 2 (two) times daily.  02/08/2020: Had 2 doses as of 11/11  . JANUVIA 25 MG tablet Take 25 mg by mouth daily.   Javier Docker Oil 500 MG CAPS Take 500 mg by mouth daily.   . Methylfol-Methylcob-Acetylcyst (CEREFOLIN NAC) 6-2-600 MG TABS TAKE 1 TABLET BY MOUTH EVERY DAY   . metolazone (ZAROXOLYN) 2.5 MG tablet Take 2.5 mg by mouth daily. 30 minutes before Bumetanide Mon. & Thurs.   . metoprolol succinate (TOPROL-XL) 25 MG 24 hr tablet Take 25 mg by mouth at bedtime.    . mirtazapine (REMERON) 15 MG tablet Take 15 mg by mouth at bedtime. Taking 1 1/2 tablet  at bedtime 04/04/2020: This has been increased to 2 tabs 15 mg = 30 mg.  . Multiple Vitamin (MULTIVITAMIN WITH MINERALS) TABS tablet Take 1 tablet by mouth daily.   . nitroGLYCERIN (NITROLINGUAL) 0.4 MG/SPRAY spray Place 1 spray under the tongue every 5 (five) minutes x 3 doses as needed for chest pain. 02/08/2020: PT states Rx has expired at Home   . pantoprazole (PROTONIX) 40 MG tablet Take 1 tablet (40 mg total) by mouth 2 (two) times daily before a meal. 04/04/2020: Takes one daily.  . potassium chloride SA (KLOR-CON) 20 MEQ tablet Take 20 mEq by mouth 2 (two) times daily.   . pramipexole (MIRAPEX) 0.25 MG tablet TAKE 1 TO 2 TABLETS AS INSTRUCTED, 1 TABLET AT 4:00 P.M. AND 2 TABLETS AT BEDTIME 04/04/2020: Takes 3 at bedtime.  Marland Kitchen RESVERATROL-GRAPE PO Take 500 mg by mouth.   . rosuvastatin (CRESTOR) 20 MG tablet Take 20 mg by mouth daily.    No facility-administered encounter medications on file as of 05/28/2020.     Assessment:   Goals Addressed   None     Plan: CALL Friday. Follow-up:  Patient agrees to Care Plan and Follow-up. ON PREVIOUS CALLS.  [name]. [name], MSN, [facility] Gerontological Nurse Practitioner [facility] Care Management [phone]

## 2020-05-30 NOTE — Progress Notes (Incomplete)
[place]   [phone] [phone]   Clinic Follow up Note   Patient Care Team: [name], DO as PCP - General (General Practice) Leonie Man, MD as PCP - Cardiology (Cardiology) Elmarie Shiley, MD as Consulting Physician (Nephrology) Leonie Man, MD as Consulting Physician (Cardiology) Alphonsa Overall, MD as Consulting Physician (General Surgery) Kyung Rudd, MD as Consulting Physician (Radiation Oncology) Truitt Merle, MD as Consulting Physician (Hematology) Delice Bison, Charlestine Massed, NP as Nurse Practitioner (Hematology and Oncology) [name], DO (General Practice) Deloria Lair, NP as [facility]  Date of Service:  05/30/2020  CHIEF COMPLAINT: F/u for Bilateral invasive breast cancer  SUMMARY OF ONCOLOGIC HISTORY: Oncology History Overview Note  Cancer Staging Malignant 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, estrogen receptor positive ([place]) Staging form: Breast, AJCC 8th Edition - Clinical: Stage IA (cT1c, cN0, cM0, G1, ER: Positive, PR: Positive, HER2: Negative) - Unsigned     Malignant 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, estrogen receptor positive ([place])  10/20/2016 Initial Biopsy   Diagnosis Breast, right, needle core biopsy, 11:30 o'clock - INVASIVE DUCTAL CARCINOMA,   10/20/2016 Initial Diagnosis   Malignant 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, estrogen receptor positive ([place])   10/20/2016 Receptors her2   Estrogen Receptor: 100%, POSITIVE, STRONG STAINING INTENSITY Progesterone Receptor: 100%, POSITIVE, STRONG STAINING INTENSITY Proliferation Marker Ki67: 5% HER2 NEGATIVE    10/20/2016 Mammogram   Diagnostic mammogram showed persistent distortion corresponds to a vaguely palpable irregular mass and is suspicious for malignancy.Targeted ultrasound is performed, showing an irregular hypoechoic mass with posterior acoustic  shadowing in the 11:30 o'clock location of the right breast 4 cm from the nipple which measures 0.9 x 0.9 x0.8 cm. There is associated internal vascularity. Evaluation of the axilla is negative for adenopathy.   11/01/2016 Imaging   B/l BREAST MRI: IMPRESSION: 1. There is a 1.5 cm biopsy proven malignancy in the upper slightly outer quadrant of the right breast. No additional sites of disease are identified.  2. There is an indeterminate 6 mm enhancing mass in the upper-outer left breast.   11/11/2016 Pathology Results   Diagnosis Breast, left, needle core biopsy, upper outer - INVASIVE DUCTAL CARCINOMA. - ATYPICAL LOBULAR HYPERPLASIA. - FIBROCYSTIC CHANGES AND PSEUDOANGIOMATOUS STROMAL HYPERPLASIA ([place]).   11/11/2016 Receptors her2    Estrogen Receptor: 100%, POSITIVE, STRONG STAINING INTENSITY Progesterone Receptor: 10%, POSITIVE, WEAK STAINING INTENSITY Proliferation Marker Ki67: 2% HER2 NEGATIVE    12/03/2016 Genetic Testing   Germline genetic testing was performed through [employer]'s Common Hereditary Cancers Panel + [employer]'s Melanoma Panel. This custom panel includes analysis of the following 51 genes: APC, ATM, AXIN2, BAP1, BARD1, BMPR1A, BRCA1, BRCA2, BRIP1, CDH1, CDK4, CDKN2A, CHEK2, CTNNA1, DICER1, EPCAM, GREM1, HOXB13, KIT, MEN1, MITF, MLH1, MSH2, MSH3, MSH6, MUTYH, NBN, NF1, NTHL1, PALB2, PDGFRA, PMS2, POLD1, POLE, POT1, PTEN, RAD50, RAD51C, RAD51D, RB1, SDHA, SDHB, SDHC, SDHD, SMAD4, SMARCA4, STK11, TP53, TSC1, TSC2, and VHL.  A variant of uncertain significance (VUS) was noted in BARD1.    12/14/2016 Miscellaneous   Oncotype Recurrence score of 16 10-year risk of distant recurrence with Tamoxifen alone is 10%   12/14/2016 Surgery   BILATERAL BREAST LUMPECTOMY WITH BILATERAL  RADIOACTIVE SEED AND BILATERAL AXILLARY  SENTINEL LYMPH NODE BIOPSY ERAS PATHWAY by Dr. Lucia Gaskins on 12/14/16   12/14/2016 Pathology Results   Diagnosis 12/14/16 1. Breast, lumpectomy, Left w/seed -  INVASIVE DUCTAL CARCINOMA, 1 CM. - ATYPICAL LOBULAR  HYPERPLASIA ([place] II). - PREVIOUS BIOPSY SITE. - INVASIVE CARCINOMA 0.2 CM FROM LATERAL MARGIN. - ATYPICA LOBULAR HYPERPLASIA LESS THAN 0.1 CM FROM LATERAL MARGIN. 2. Breast, lumpectomy, Right w/seed - INVASIVE DUCTAL CARCINOMA, 1.1 CM. - FIBROCYSTIC CHANGES. - PREVIOUS BIOPSY SITE. - INVASIVE CARCINOMA 1.1 CM FROM POSTERIOR MARGIN. 3. Lymph node, sentinel, biopsy, Right Axillary Contents - BENIGN FIBROADIPOSE TISSUE. - NO LYMPH NODE TISSUE OR MALIGNANCY. 4. Lymph node, sentinel, biopsy, Left Axillary - ONE BENIGN LYMPH NODE (0/1).    01/17/2017 - 02/11/2017 Radiation Therapy   1. The Left breast was treated to 42.5 Gy in 17 fractions of 2.5 Gy.  2. The Right breast was treated to 42.5 Gy in 17 fractions of 2.5 Gy.  3. The Right breast was boosted to 7.5 Gy in 3 fractions of 2.5 Gy.  4. The Left breast was boosted to 7.5 Gy in 3 fractions of 2.5 Gy.       02/2017 -  Anti-estrogen oral therapy   Exemestane daily      CURRENT THERAPY:  Adjuvant Exemestane started on 03/07/17  INTERVAL HISTORY: *** [name] is here for a follow up of b/l breast cancer. She was last seen by me 6 months ago. She presents to the clinic alone.   REVIEW OF SYSTEMS:  *** Constitutional: Denies fevers, chills or abnormal weight loss Eyes: Denies blurriness of vision Ears, nose, mouth, throat, and face: Denies mucositis or sore throat Respiratory: Denies cough, dyspnea or wheezes Cardiovascular: Denies palpitation, chest discomfort or lower extremity swelling Gastrointestinal:  Denies nausea, heartburn or change in bowel habits Skin: Denies abnormal skin rashes Lymphatics: Denies new lymphadenopathy or easy bruising Neurological:Denies numbness, tingling or new weaknesses Behavioral/Psych: Mood is stable, no new changes  All other systems were reviewed with the patient and are negative.  MEDICAL HISTORY:  Past Medical History:  Diagnosis  Date  . Anemia    have received iron infusions  . Anxiety   . Arthritis     osteoarthritis  . Breast cancer ([place])   . Breast cancer in female [facility])    Bilateral  . CAD S/P percutaneous coronary angioplasty 2007; March 2011   [employer] 3.0 mm 20 mm postdilated 3.6 mm in early mid LAD; status post ISR Cutting Balloon PTCA and March 11 along with PCI of distal mid lesion with a 3.0 mm 12 mm MultiLink vision BMS; the proximal stent causes jailing of SP1 and SP2 with ostial 70-80% lesions  . Cancer (HCC)    Melanoma, Squamous cell Carcinoma  . Charcot's joint of foot, right   . Charcot's joint of left foot   . Chronic diastolic CHF (congestive heart failure), NYHA class 2 ([place]) 02/28/2018  . Chronic kidney disease    stage 2   . Coronary artery disease   . Dementia ([place])    mild  . Diabetes mellitus type 2 with neurological manifestations ([place])   . Diabetes mellitus without complication ([place])   . Dyslipidemia, goal LDL below 70   . Foot pain, bilateral   . Full dentures   . Genetic testing 12/03/2016   Germline genetic testing was performed through [employer]'s Common Hereditary Cancers Panel + [employer]'s Melanoma Panel. This custom panel includes analysis of the following 51 genes: APC, ATM, AXIN2, BAP1, BARD1, BMPR1A, BRCA1, BRCA2, BRIP1, CDH1, CDK4, CDKN2A, CHEK2, CTNNA1, DICER1, EPCAM, GREM1, HOXB13, KIT, MEN1, MITF, MLH1, MSH2, MSH3, MSH6, MUTYH, NBN, NF1, NTHL1, PALB2, PDGFRA, PMS2, POLD1, POL  . GERD (gastroesophageal reflux disease)   .  Gout   . Headache(784.0)   . History of hematuria    Followed by Dr. Gaynelle Arabian  . Hypertension, essential, benign   . Migraine   . Mild cognitive impairment   . Peripheral neuropathy   . Personal history of radiation therapy   . Post herpetic neuralgia   . Restless leg syndrome   . Stroke ([place])   . TIA (transient ischemic attack)    multiple in the past  . TIA (transient ischemic attack)   . Wears glasses     SURGICAL HISTORY: Past  Surgical History:  Procedure Laterality Date  . ABDOMINAL HYSTERECTOMY    . ANKLE FUSION Right 02/22/2018   Procedure: RIGHT SUBTALAR AND TALONAVICULAR FUSION;  Surgeon: Newt Minion, MD;  Location: [place];  Service: Orthopedics;  Laterality: Right;  . APPENDECTOMY    . BIOPSY  02/19/2020   Procedure: BIOPSY;  Surgeon: Otis Brace, MD;  Location: [place];  Service: Gastroenterology;;  . BLADDER SUSPENSION    . BREAST LUMPECTOMY Bilateral 2018  . BREAST LUMPECTOMY WITH RADIOACTIVE SEED AND SENTINEL LYMPH NODE BIOPSY Bilateral 12/14/2016   Procedure: BILATERAL BREAST LUMPECTOMY WITH BILATERAL  RADIOACTIVE SEED AND BILATERAL AXILLARY  SENTINEL LYMPH NODE BIOPSY ERAS PATHWAY;  Surgeon: Alphonsa Overall, MD;  Location: [place];  Service: General;  Laterality: Bilateral;  PECTORAL BLOCK  . CARDIAC CATHETERIZATION  02/24/2006   85% stenosis in the proximal portion of LAD-arrangements made for PCI on 02/25/2006  . CARDIAC CATHETERIZATION  02/25/2006   80% LAD lesion stented with a 3x3m Liberte stent resulting in reduction of 80% lesion to 0% residual  . CARDIAC CATHETERIZATION  04/26/2006   Medical management  . CARDIAC CATHETERIZATION  06/24/2009   60-70% re-stenosis in the proximal LAD. A 3.25x15 cutting balloon, 3 inflations - 14atm-38sec, 13atm-39sec, and 12atm-40sec reduced to less than 10%. 60% stenosis of the mid/distal LAD stented with a 3x123mMultilink stent.  . Marland KitchenARDIAC CATHETERIZATION  07/09/2009   Medical management  . CARDIAC CATHETERIZATION    . CAROTID DOPPLER  06/24/2009   40-59% R ICA stenosis. No significant ICA stenosis noted  . CHOLECYSTECTOMY    . CORONARY ANGIOPLASTY    . DILATION AND CURETTAGE OF UTERUS    . ESOPHAGOGASTRODUODENOSCOPY N/A 02/26/2020   Procedure: ESOPHAGOGASTRODUODENOSCOPY (EGD);  Surgeon: MaClarene EssexMD;  [facility] Service: Endoscopy;  Laterality: N/A;  . ESOPHAGOGASTRODUODENOSCOPY (EGD) WITH PROPOFOL N/A 02/19/2020   Procedure:  ESOPHAGOGASTRODUODENOSCOPY (EGD) WITH PROPOFOL;  Surgeon: BrOtis BraceMD;  [facility];  Service: Gastroenterology;  Laterality: N/A;  . ESOPHAGOGASTRODUODENOSCOPY (EGD) WITH PROPOFOL Left 02/23/2020   Procedure: ESOPHAGOGASTRODUODENOSCOPY (EGD) WITH PROPOFOL;  Surgeon: OuArta SilenceMD;  [facility];  Service: Endoscopy;  Laterality: Left;  . HIP ARTHROPLASTY Left 02/10/2020   Procedure: ARTHROPLASTY BIPOLAR HIP (HEMIARTHROPLASTY) POSTERIOR LATERAL;  Surgeon: MuRenette ButtersMD;  [facility] Service: Orthopedics;  Laterality: Left;  . JOINT REPLACEMENT Left   . LESION REMOVAL Right 03/11/2015   Procedure:  EXCISION OF RIGHT PRE TIBIAL LESION;  Surgeon: JaJudeth HornMD;  [facility] Service: General;  Laterality: Right;  . MASS EXCISION Left 06/10/2014   Procedure: EXCISION LEFT LOWER LEG LESION;  Surgeon: JaDoreen SalvageMD;  [place] [facility] Service: General;  Laterality: Left;  . Marland KitchenASS EXCISION Left 09/05/2015   Procedure: EXCISION OF LEFT FOREARM  MASS;  Surgeon: JaJudeth HornMD;  [facility] Service: General;  Laterality: Left;  .  NM MYOVIEW LTD  10/2011   No ischemia or infarction.  Normal EF.  LOW RISK. (Short bursts of 5 beats NSVT during recovery otherwise normal)  . POLYPECTOMY  02/19/2020   Procedure: POLYPECTOMY;  Surgeon: Otis Brace, MD;  Location: [place] [facility];  Service: Gastroenterology;;  . SHOULDER ARTHROSCOPY  10/15  . SHOULDER ARTHROSCOPY  1995   left  . TEE WITHOUT CARDIOVERSION N/A 08/03/2017   Procedure: TRANSESOPHAGEAL ECHOCARDIOGRAM (TEE);  Surgeon: Skeet Latch, MD;  Location: [place];  Service: Cardiovascular;; EF 60-65% with no wall motion normalities.  No atrial thrombus noted.  Lightly findings consistent with a lipomatous hypertrophy of the atrial septum.   . TENDON REPAIR Left 05/12/2016   Procedure: Left Anterior Tibial Tendon Reconstruction;   Surgeon: Newt Minion, MD;  Location: [place];  Service: Orthopedics;  Laterality: Left;  . TOTAL KNEE ARTHROPLASTY  2005   left  . TRANSESOPHAGEAL ECHOCARDIOGRAM  08/03/2016   : EF 60-65% no wall motion normalities.  No atrial thrombus.  Lipomatous hypertrophy of the atrial septum.  No mass noted.  . TRANSESOPHAGEAL ECHOCARDIOGRAM  07/2017   EF 60 to 65%.  No R WMA.  No atrial thrombus noted.  Lipomatous IAS.  Marland Kitchen TRANSTHORACIC ECHOCARDIOGRAM  02/25/2017   :EF 65-70%.  GR 1 DD.  No or WMA.  Moderate aortic calcification/sclerosis but no stenosis.  . TRANSTHORACIC ECHOCARDIOGRAM  07/2017   Vigorous LV function.  EF 65-70%.  Mild diastolic dysfunction with no RWMA. GR 1 DD.  Mild aortic sclerosis/no stenosis.  Questionable right atrial mass discounted with TEE)   . WRIST ARTHROPLASTY  2010   cancer lt wrist    I have reviewed the social history and family history with the patient and they are unchanged from previous note.  ALLERGIES:  is allergic to nsaids, reglan [metoclopramide], tramadol, ultram [tramadol], lipitor [atorvastatin], lipitor [atorvastatin], lyrica [pregabalin], lyrica [pregabalin], reglan [metoclopramide], nsaids, and urecholine [bethanechol].  MEDICATIONS:  Current Outpatient Medications  Medication Sig Dispense Refill  . BIOTIN PO Take 1 tablet by mouth daily.    . bumetanide (BUMEX) 2 MG tablet Take 2 mg by mouth daily. 2 in morning and 1 in afternoon    . Calcium Carb-Cholecalciferol 600-800 MG-UNIT TABS Take 1 tablet by mouth daily. (Patient not taking: Reported on 04/04/2020)    . Cholecalciferol (VITAMIN D) 50 MCG (2000 UT) tablet Take 2,000 Units by mouth daily.    . clopidogrel (PLAVIX) 75 MG tablet Take 75 mg by mouth daily.    . Coenzyme Q10 (COQ-10 PO) Take 1 tablet by mouth daily.    Marland Kitchen COLCRYS 0.6 MG tablet TAKE 1 TABLET DAILY AS NEEDED FOR GOUT FLARE. (Patient not taking: Reported on 04/04/2020) 90 tablet 3  . Cranberry 500 MG TABS Take 500 mg by mouth every  evening.    Marland Kitchen CRANBERRY-CALCIUM PO Take 1 tablet by mouth daily. (Patient not taking: Reported on 04/04/2020)    . diazepam (VALIUM) 5 MG tablet Take 5 mg by mouth every 12 (twelve) hours as needed for anxiety.    Marland Kitchen exemestane (AROMASIN) 25 MG tablet TAKE 1 TABLET DAILY AFTER BREAKFAST 90 tablet 3  . febuxostat (ULORIC) 40 MG tablet Take 40 mg by mouth daily.    Marland Kitchen gabapentin (NEURONTIN) 300 MG capsule Take 300 mg by mouth 2 (two) times daily.     Marland Kitchen JANUVIA 25 MG tablet Take 25 mg by mouth daily.    Javier Docker Oil 500 MG CAPS Take 500 mg by mouth daily.    Marland Kitchen  Methylfol-Methylcob-Acetylcyst (CEREFOLIN NAC) 6-2-600 MG TABS TAKE 1 TABLET BY MOUTH EVERY DAY 90 tablet 3  . metolazone (ZAROXOLYN) 2.5 MG tablet Take 2.5 mg by mouth daily. 30 minutes before Bumetanide Mon. & Thurs.    . metoprolol succinate (TOPROL-XL) 25 MG 24 hr tablet Take 25 mg by mouth at bedtime.     . mirtazapine (REMERON) 15 MG tablet Take 15 mg by mouth at bedtime. Taking 1 1/2 tablet at bedtime    . Multiple Vitamin (MULTIVITAMIN WITH MINERALS) TABS tablet Take 1 tablet by mouth daily.    . nitroGLYCERIN (NITROLINGUAL) 0.4 MG/SPRAY spray Place 1 spray under the tongue every 5 (five) minutes x 3 doses as needed for chest pain.    . pantoprazole (PROTONIX) 40 MG tablet Take 1 tablet (40 mg total) by mouth 2 (two) times daily before a meal.    . potassium chloride SA (KLOR-CON) 20 MEQ tablet Take 20 mEq by mouth 2 (two) times daily.    . pramipexole (MIRAPEX) 0.25 MG tablet TAKE 1 TO 2 TABLETS AS INSTRUCTED, 1 TABLET AT 4:00 P.M. AND 2 TABLETS AT BEDTIME 270 tablet 1  . RESVERATROL-GRAPE PO Take 500 mg by mouth.    . rosuvastatin (CRESTOR) 20 MG tablet Take 20 mg by mouth daily.     No current facility-administered medications for this visit.    PHYSICAL EXAMINATION: ECOG PERFORMANCE STATUS: {CHL ONC ECOG PS:[id number]}  There were no vitals filed for this visit. There were no vitals filed for this visit. *** GENERAL:alert, no  distress and comfortable SKIN: skin color, texture, turgor are normal, no rashes or significant lesions EYES: normal, Conjunctiva are pink and non-injected, sclera clear {OROPHARYNX:no exudate, no erythema and lips, buccal mucosa, and tongue normal}  NECK: supple, thyroid normal size, non-tender, without nodularity LYMPH:  no palpable lymphadenopathy in the cervical, axillary {or inguinal} LUNGS: clear to auscultation and percussion with normal breathing effort HEART: regular rate & rhythm and no murmurs and no lower extremity edema ABDOMEN:abdomen soft, non-tender and normal bowel sounds Musculoskeletal:no cyanosis of digits and no clubbing  NEURO: alert & oriented x 3 with fluent speech, no focal motor/sensory deficits  LABORATORY DATA:  I have reviewed the data as listed CBC Latest Ref Rng & Units 03/02/2020 03/01/2020 02/29/2020  WBC 4.0 - 10.5 K/uL 8.8 9.2 8.4  Hemoglobin 12.0 - 15.0 g/dL 8.5(L) 8.9(L) 8.2(L)  Hematocrit 36.0 - 46.0 % 28.1(L) 28.7(L) 27.4(L)  Platelets 150 - 400 K/uL 299 321 308     CMP Latest Ref Rng & Units 03/02/2020 03/01/2020 02/29/2020  Glucose 70 - 99 mg/dL 212(H) 137(H) 125(H)  BUN 8 - 23 mg/dL _0 Creatinine 0.44 - 1.00 mg/dL 1.22(H) 1.09(H) 1.22(H)  Sodium 135 - 145 mmol/L 138 141 141  Potassium 3.5 - 5.1 mmol/L 3.8 3.3(L) 3.5  Chloride 98 - 111 mmol/L 101 102 105  CO2 22 - 32 mmol/L _1 Calcium 8.9 - 10.3 mg/dL 8.5(L) 8.8(L) 8.5(L)  Total Protein 6.5 - 8.1 g/dL 5.2(L) - -  Total Bilirubin 0.3 - 1.2 mg/dL 0.4 - -  Alkaline Phos 38 - 126 U/L 89 - -  AST 15 - 41 U/L 29 - -  ALT 0 - 44 U/L 24 - -      RADIOGRAPHIC STUDIES: I have personally reviewed the radiological images as listed and agreed with the findings in the report. No results found.   ASSESSMENT & PLAN:  Ann-Marie Kluge is a 80 y.o. 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 She is s/p b/l breast lumpectomy and has adjuvant radiation.Her RS was 16 and no benefit from chemotherapy so not recommended.  -She started antiestrogen therapy with Exemestane in 02/2017.She istolerating wellwith no side effects, plan for 5 years therapy  -From a breast cancer standpoint, she is clinically doing well. Lab reviewed, her CBC and CMP are within normal limits except Hg 11.1, BG 125, Cr 1.61. Her physical exam benign. Her 10/2019 mammogram was unremarkable. There is no clinical concern for recurrence. -Continue surveillance. Next mammogram in 10/2020 -Continue Exemestane -f/u in 6 months    2. Genetics Negative for pathogenetic mutations.   3. HTN, DM, CAD and CHF with LE edema, CKD Stage IV, Skin Cancers (mainly squamous Cel) -Continue f/u withPCP, Dermatologist, nephrologist [name], and her cardiologist [name]  4. Arthritis,diabetic foot, recent fall and balance issue  -She underwent right foot surgery in 01/2018. She had sepsis afterward, has resolved/recovered.  -She had diabetic foot along with worsened LE edema in her ankles which effects her balance.  -She tried b/l lower leg braces with orthopedist recently to help her balance but resulted in a bad fall in 10/2019.  -she has difficulty to ambulate due to her balance issue  -I recommend home PT to help gain strength and improve balance.   5. Osteopenia  -Her 04/2019 DEXA showed worsened Osteopenia with lowest T-score -2.3 at left hip. -Given her CKD, will hold treatment with Bisphosphonate  -Continue calcium and vit D  6. Right shoulder and axilla neuralgia  -Pt attributes this to her prior breast surgery  -She does have h/o shingles around her right upper back and flank -I discussed option of acupuncture to help with her pain. She will think about it.    PLAN -Send home PT referral  -Continue Exemestane -Lab and  f/u in 9month   No problem-specific Assessment & Plan notes found for this encounter.   No orders of the defined types were placed in this encounter.  All questions were answered. The patient knows to call the clinic with any problems, questions or concerns. No barriers to learning was detected. The total time spent in the appointment was {CHL ONC TIME VISIT - SBMWUX:[id number]}     AJoslyn Devon3/06/2020   IOneal Deputy am acting as scribe for YTruitt Merle MD.   {Add scribe attestation statement}

## 2020-06-03 ENCOUNTER — Other Ambulatory Visit: Payer: Self-pay | Admitting: Hematology

## 2020-06-03 DIAGNOSIS — D500 Iron deficiency anemia secondary to blood loss (chronic): Secondary | ICD-10-CM

## 2020-06-04 ENCOUNTER — Inpatient Hospital Stay: Payer: Medicare Other | Admitting: Hematology

## 2020-06-04 ENCOUNTER — Telehealth: Payer: Self-pay | Admitting: Hematology

## 2020-06-04 ENCOUNTER — Inpatient Hospital Stay: Payer: Medicare Other

## 2020-06-04 DIAGNOSIS — C50412 Malignant neoplasm of upper-outer quadrant of left female breast: Secondary | ICD-10-CM

## 2020-06-04 DIAGNOSIS — C50211 Malignant neoplasm of upper-inner quadrant of right female breast: Secondary | ICD-10-CM

## 2020-06-04 NOTE — Telephone Encounter (Signed)
Returned call to cancel today's appointment. Left message to call back to reschedule if needed. 

## 2020-06-19 ENCOUNTER — Other Ambulatory Visit: Payer: Self-pay | Admitting: *Deleted

## 2020-06-19 NOTE — Patient Outreach (Signed)
[facility]) Care Management  [place]  06/19/2020   Laiza Veenstra Flurry [birth year] [id number]  Subjective: Telephone outreach to follow up on pt general health and well-being.  Objective:  Pt reports her glucose levels are 130's-140's  Encounter Medications:  Outpatient Encounter Medications as of 06/19/2020  Medication Sig Note  . BIOTIN PO Take 1 tablet by mouth daily.   . bumetanide (BUMEX) 2 MG tablet Take 2 mg by mouth daily. 2 in morning and 1 in afternoon   . Calcium Carb-Cholecalciferol 600-800 MG-UNIT TABS Take 1 tablet by mouth daily. (Patient not taking: Reported on 04/04/2020)   . Cholecalciferol (VITAMIN D) 50 MCG (2000 UT) tablet Take 2,000 Units by mouth daily.   . clopidogrel (PLAVIX) 75 MG tablet Take 75 mg by mouth daily.   . Coenzyme Q10 (COQ-10 PO) Take 1 tablet by mouth daily.   Marland Kitchen COLCRYS 0.6 MG tablet TAKE 1 TABLET DAILY AS NEEDED FOR GOUT FLARE. (Patient not taking: Reported on 04/04/2020)   . Cranberry 500 MG TABS Take 500 mg by mouth every evening.   Marland Kitchen CRANBERRY-CALCIUM PO Take 1 tablet by mouth daily. (Patient not taking: Reported on 04/04/2020)   . diazepam (VALIUM) 5 MG tablet Take 5 mg by mouth every 12 (twelve) hours as needed for anxiety.   Marland Kitchen exemestane (AROMASIN) 25 MG tablet TAKE 1 TABLET DAILY AFTER BREAKFAST   . febuxostat (ULORIC) 40 MG tablet Take 40 mg by mouth daily.   Marland Kitchen gabapentin (NEURONTIN) 300 MG capsule Take 300 mg by mouth 2 (two) times daily.  02/08/2020: Had 2 doses as of 11/11  . JANUVIA 25 MG tablet Take 25 mg by mouth daily.   Javier Docker Oil 500 MG CAPS Take 500 mg by mouth daily.   . Methylfol-Methylcob-Acetylcyst (CEREFOLIN NAC) 6-2-600 MG TABS TAKE 1 TABLET BY MOUTH EVERY DAY   . metolazone (ZAROXOLYN) 2.5 MG tablet Take 2.5 mg by mouth daily. 30 minutes before Bumetanide Mon. & Thurs.   . metoprolol succinate (TOPROL-XL) 25 MG 24 hr tablet Take 25 mg by mouth at bedtime.    . mirtazapine (REMERON) 15 MG tablet Take  15 mg by mouth at bedtime. Taking 1 1/2 tablet at bedtime 04/04/2020: This has been increased to 2 tabs 15 mg = 30 mg.  . Multiple Vitamin (MULTIVITAMIN WITH MINERALS) TABS tablet Take 1 tablet by mouth daily.   . nitroGLYCERIN (NITROLINGUAL) 0.4 MG/SPRAY spray Place 1 spray under the tongue every 5 (five) minutes x 3 doses as needed for chest pain. 02/08/2020: PT states Rx has expired at Home   . pantoprazole (PROTONIX) 40 MG tablet Take 1 tablet (40 mg total) by mouth 2 (two) times daily before a meal. 04/04/2020: Takes one daily.  . potassium chloride SA (KLOR-CON) 20 MEQ tablet Take 20 mEq by mouth 2 (two) times daily.   . pramipexole (MIRAPEX) 0.25 MG tablet TAKE 1 TO 2 TABLETS AS INSTRUCTED, 1 TABLET AT 4:00 P.M. AND 2 TABLETS AT BEDTIME 04/04/2020: Takes 3 at bedtime.  Marland Kitchen RESVERATROL-GRAPE PO Take 500 mg by mouth.   . rosuvastatin (CRESTOR) 20 MG tablet Take 20 mg by mouth daily.    No facility-administered encounter medications on file as of 06/19/2020.    Assessment: DM not at goal                        High Risk for Falls - no falls  Care Gaps: DM eye exam, foot exam, HgbA1C, pap, ?coloscopy, mammogram, urinemicro  Goals Addressed            This Visit's Progress   . COMPLETED: Client verbalize knowledge of Heart Failure disease self management skills by taking medications as per ordered, low sodium diet, monitoring for CHF exacerbation       CARE PLAN ENTRY (see longtitudinal plan of care for additional care plan information)   Current Barriers:  Marland Kitchen Knowledge deficit related to basic heart failure pathophysiology and self care management  Case Manager Clinical Goal(s):  Marland Kitchen Over the next 90 days, patient will verbalize understanding of Heart Failure Action Plan and when to call doctor . Over the next 90 days, patient will take all Heart Failure mediations as prescribed . Over the next 90 days, patient will weigh daily and record (notifying MD of 3 lb weight  gain over night or 5 lb in a week) . Patient will verbalize receiving annual flu vaccine within the next 90 days.   Interventions:  . Basic overview and discussion of pathophysiology of Heart Failure reviewed  . Provided verbal education on low sodium diet . Provided written education on low sodium diet . Reviewed Heart Failure Action Plan in depth and provided written copy . Discussed importance of daily weight and advised patient to weigh and record daily . Reviewed role of diuretics in prevention of fluid overload and management of heart failure  Patient Self Care Activities:  . Takes Heart Failure Medications as prescribed . Weighs daily and record (notifying MD of 3 lb weight gain over night or 5 lb in a week) . Verbalizes understanding of and follows CHF Action Plan . Adheres to low sodium diet  Initial goal documentation     . Make and Keep All Appointments   Not on track    Timeframe:  Short-Term Goal Priority:  High Start Date:     04/30/20                   Expected End Date:   Extend goal for another 30 days to 07/21/20.     Follow Up Date 07/21/20 - call to cancel if needed - keep a calendar with appointment dates    Why is this important?    Part of staying healthy is seeing the doctor for follow-up care.   If you forget your appointments, there are some things you can do to stay on track.    Notes: 04/30/20 Pt did cancel an appt with her endocrinologist. She plans on seeking another doctor. Encouraged to not delay doing so, as her diabetes management should be a priority for her health. We have extended this goal so she an an opportunity to schedule an eye exam, a foot exam and a new endocrinologist. 06/19/20 Pt acknowledges need to schedule primary care appt to close gaps in care. She needs foot exam, urine microalbumin, pap, ?colonoscopy and needs to schedule an eye exam. She needs her HgbA1C rechecked last was 8.8. She says she will make an eye appt and primary care appt  today.    . [place] My Quality of Life as evidenced by equal or improved QOL score at the end of March   On track    Timeframe:  Long-Range Goal Priority:  High Start Date:       1//722                      Expected End  Date:    06/26/20, Extend another month to 07/21/20.                 Follow Up Date 07/21/20  - complete a living will - do one enjoyable thing every day - do something different, like talking to a new person or going to a new place, every day - learn something new by asking, reading and searching the Internet every day - meditate daily - spend time outdoors at least 3 times a week    Why is this important?    Having a long-term illness can be scary.   It can also be stressful for you and your caregiver.   These steps may help.    Notes: 04/30/20 Received advanced directives. Encouraged to complete, get notorized and make copies for self, MD and those involved with decision making. Discuss emergent scenarios with family and advise of desires 06/19/20 Pt had HCPOA nearly completed needs to be notarized and copied and given to MD to scan into her chart. Quality of life score actually went from 8 to a 67 indicating a slight decline however, pt says she has adapted to her new normal and is trying to enjoy life as much as she can. Encouraged completion of her HCPOA and living will.    . COMPLETED: Prevent Falls and Injury as evidenced by no ED visits by the 06/05/20       Follow Up Date 06/05/20 Completed 06/19/20  - always use handrails on the stairs - always wear low-heeled or flat shoes or slippers with nonskid soles - call the doctor if I am feeling too drowsy - install bathroom grab bars - keep a flashlight by the bed - keep my cell phone with me always - make an emergency alert plan in case I fall - pick up clutter from the floors - use a nonslip pad with throw rugs, or remove them completely - use a cane or walker - use a nightlight in the bathroom    Why is this  important?    Most falls happen when it is hard for you to walk safely. Your balance may be off because of an illness. You may have pain in your knees, hip or other joints.   You may be overly tired or taking medicines that make you sleepy. You may not be able to see or hear clearly.   Falls can lead to broken bones, bruises or other injuries.   There are things you can do to help prevent falling.     Notes: 04/16/20 No falls or injuries since she has returned home from rehab. Participating in PT/OT. Reports she is making good progress but hopes to continue with the therapies because she is not where she would like to be yet. Fall prevention discussed and is reinforced with her therapies. 04/30/20 No falls. Pt is very conscientious about her mobility. Has completed OT but continues PT. 06/19/20 Denies falls. Husband is very helpful in doing things she is not comfortable with her mobility in, for example cooking. She is keenly aware that her safety is first!    . Protect My Health as evidenced by closing all care gaps by 06/26/20.   Not on track    Timeframe:  Long-Range Goal Priority:  High Start Date:       04/04/20                      Expected End Date:  06/26/20 , extended to 07/21/20             Follow Up Date 06/05/20 - schedule recommended health tests (blood work, mammogram, colonoscopy, pap test) - schedule and keep appointment for annual check-up    Why is this important?    Screening tests can find diseases early when they are easier to treat.   Your doctor or nurse will talk with you about which tests are important for you.   Getting shots for common diseases like the flu and shingles will help prevent them.     Notes: 04/16/20 Had to cancel an appt last week, did not feel well. She does have it rescheduled. This is not an issue for her. Reviewed vacs which are up to date. Eye exam and dental exams are due. Pt to make appts. 04/30/20 Pt did not make appts. Encouraged that doing these  self maintenance tasks are part of Medicare required tasks for members. They are important to avoid complications. She did have Hgb A1C in November which was high 8.8. Today she reports her FBS and NFBS are <160. NP called primary to verify if she has had pneumonia, flu and Td vaccines. Verified her 3 Moderna Covid vaccines today. 06/19/20 Pt has not seen her primary and acknowledges she needs to do so. Gave her a list of things that she needs help from [name] to complete: foot exam, HgbA1C, pap, urine for protein, mammogram, and possible colonoscopy. She also needs a diabetic eye exam. Had pt write up this list so she can check everything off by the time we talk again in one month.       Plan:  We agreed to follow up in one month 07/21/20.  Follow-up:  Patient agrees to Care Plan and Follow-up.   [name]. [name], MSN, [facility] Gerontological Nurse Practitioner [facility] Care Management [phone]

## 2020-06-24 DIAGNOSIS — E1122 Type 2 diabetes mellitus with diabetic chronic kidney disease: Secondary | ICD-10-CM | POA: Diagnosis not present

## 2020-06-24 DIAGNOSIS — I700 Atherosclerosis of aorta: Secondary | ICD-10-CM | POA: Diagnosis not present

## 2020-06-24 DIAGNOSIS — F419 Anxiety disorder, unspecified: Secondary | ICD-10-CM | POA: Diagnosis not present

## 2020-06-24 DIAGNOSIS — E785 Hyperlipidemia, unspecified: Secondary | ICD-10-CM | POA: Diagnosis not present

## 2020-06-24 DIAGNOSIS — D6859 Other primary thrombophilia: Secondary | ICD-10-CM | POA: Diagnosis not present

## 2020-06-24 DIAGNOSIS — G2581 Restless legs syndrome: Secondary | ICD-10-CM | POA: Diagnosis not present

## 2020-06-24 DIAGNOSIS — F330 Major depressive disorder, recurrent, mild: Secondary | ICD-10-CM | POA: Diagnosis not present

## 2020-06-24 DIAGNOSIS — I5032 Chronic diastolic (congestive) heart failure: Secondary | ICD-10-CM | POA: Diagnosis not present

## 2020-06-24 DIAGNOSIS — E663 Overweight: Secondary | ICD-10-CM | POA: Diagnosis not present

## 2020-06-24 DIAGNOSIS — E1142 Type 2 diabetes mellitus with diabetic polyneuropathy: Secondary | ICD-10-CM | POA: Diagnosis not present

## 2020-06-24 DIAGNOSIS — F0390 Unspecified dementia without behavioral disturbance: Secondary | ICD-10-CM | POA: Diagnosis not present

## 2020-06-24 DIAGNOSIS — I130 Hypertensive heart and chronic kidney disease with heart failure and stage 1 through stage 4 chronic kidney disease, or unspecified chronic kidney disease: Secondary | ICD-10-CM | POA: Diagnosis not present

## 2020-07-02 ENCOUNTER — Other Ambulatory Visit: Payer: Self-pay | Admitting: Adult Health

## 2020-07-16 DIAGNOSIS — E663 Overweight: Secondary | ICD-10-CM | POA: Diagnosis not present

## 2020-07-16 DIAGNOSIS — G2581 Restless legs syndrome: Secondary | ICD-10-CM | POA: Diagnosis not present

## 2020-07-16 DIAGNOSIS — I25118 Atherosclerotic heart disease of native coronary artery with other forms of angina pectoris: Secondary | ICD-10-CM | POA: Diagnosis not present

## 2020-07-16 DIAGNOSIS — N390 Urinary tract infection, site not specified: Secondary | ICD-10-CM | POA: Diagnosis not present

## 2020-07-16 DIAGNOSIS — I130 Hypertensive heart and chronic kidney disease with heart failure and stage 1 through stage 4 chronic kidney disease, or unspecified chronic kidney disease: Secondary | ICD-10-CM | POA: Diagnosis not present

## 2020-07-16 DIAGNOSIS — E1122 Type 2 diabetes mellitus with diabetic chronic kidney disease: Secondary | ICD-10-CM | POA: Diagnosis not present

## 2020-07-16 DIAGNOSIS — Z0000 Encounter for general adult medical examination without abnormal findings: Secondary | ICD-10-CM | POA: Diagnosis not present

## 2020-07-16 DIAGNOSIS — N184 Chronic kidney disease, stage 4 (severe): Secondary | ICD-10-CM | POA: Diagnosis not present

## 2020-07-16 DIAGNOSIS — I5032 Chronic diastolic (congestive) heart failure: Secondary | ICD-10-CM | POA: Diagnosis not present

## 2020-07-16 DIAGNOSIS — E1165 Type 2 diabetes mellitus with hyperglycemia: Secondary | ICD-10-CM | POA: Diagnosis not present

## 2020-07-16 DIAGNOSIS — E785 Hyperlipidemia, unspecified: Secondary | ICD-10-CM | POA: Diagnosis not present

## 2020-07-16 DIAGNOSIS — Z79899 Other long term (current) drug therapy: Secondary | ICD-10-CM | POA: Diagnosis not present

## 2020-07-18 ENCOUNTER — Other Ambulatory Visit: Payer: Self-pay

## 2020-07-18 ENCOUNTER — Emergency Department (HOSPITAL_COMMUNITY): Payer: Medicare Other

## 2020-07-18 ENCOUNTER — Inpatient Hospital Stay (HOSPITAL_COMMUNITY)
Admission: EM | Admit: 2020-07-18 | Discharge: 2020-07-21 | DRG: 302 | Disposition: A | Discharge status: Home Health | Payer: Medicare Other | Attending: Internal Medicine | Admitting: Internal Medicine

## 2020-07-18 ENCOUNTER — Encounter (HOSPITAL_COMMUNITY): Payer: Self-pay

## 2020-07-18 DIAGNOSIS — E1159 Type 2 diabetes mellitus with other circulatory complications: Secondary | ICD-10-CM

## 2020-07-18 DIAGNOSIS — J9811 Atelectasis: Secondary | ICD-10-CM | POA: Diagnosis present

## 2020-07-18 DIAGNOSIS — Z833 Family history of diabetes mellitus: Secondary | ICD-10-CM

## 2020-07-18 DIAGNOSIS — Z955 Presence of coronary angioplasty implant and graft: Secondary | ICD-10-CM | POA: Diagnosis not present

## 2020-07-18 DIAGNOSIS — G3184 Mild cognitive impairment, so stated: Secondary | ICD-10-CM | POA: Diagnosis present

## 2020-07-18 DIAGNOSIS — Z83438 Family history of other disorder of lipoprotein metabolism and other lipidemia: Secondary | ICD-10-CM

## 2020-07-18 DIAGNOSIS — Z803 Family history of malignant neoplasm of breast: Secondary | ICD-10-CM

## 2020-07-18 DIAGNOSIS — Z7902 Long term (current) use of antithrombotics/antiplatelets: Secondary | ICD-10-CM

## 2020-07-18 DIAGNOSIS — Z8711 Personal history of peptic ulcer disease: Secondary | ICD-10-CM | POA: Diagnosis not present

## 2020-07-18 DIAGNOSIS — Z853 Personal history of malignant neoplasm of breast: Secondary | ICD-10-CM

## 2020-07-18 DIAGNOSIS — N184 Chronic kidney disease, stage 4 (severe): Secondary | ICD-10-CM | POA: Diagnosis not present

## 2020-07-18 DIAGNOSIS — Z841 Family history of disorders of kidney and ureter: Secondary | ICD-10-CM

## 2020-07-18 DIAGNOSIS — Z96652 Presence of left artificial knee joint: Secondary | ICD-10-CM | POA: Diagnosis present

## 2020-07-18 DIAGNOSIS — Z8679 Personal history of other diseases of the circulatory system: Secondary | ICD-10-CM

## 2020-07-18 DIAGNOSIS — R059 Cough, unspecified: Secondary | ICD-10-CM | POA: Diagnosis not present

## 2020-07-18 DIAGNOSIS — J9621 Acute and chronic respiratory failure with hypoxia: Secondary | ICD-10-CM | POA: Diagnosis present

## 2020-07-18 DIAGNOSIS — R0789 Other chest pain: Secondary | ICD-10-CM | POA: Diagnosis not present

## 2020-07-18 DIAGNOSIS — J9601 Acute respiratory failure with hypoxia: Secondary | ICD-10-CM | POA: Diagnosis not present

## 2020-07-18 DIAGNOSIS — F419 Anxiety disorder, unspecified: Secondary | ICD-10-CM | POA: Diagnosis present

## 2020-07-18 DIAGNOSIS — F0390 Unspecified dementia without behavioral disturbance: Secondary | ICD-10-CM | POA: Diagnosis present

## 2020-07-18 DIAGNOSIS — Z8673 Personal history of transient ischemic attack (TIA), and cerebral infarction without residual deficits: Secondary | ICD-10-CM

## 2020-07-18 DIAGNOSIS — Z9861 Coronary angioplasty status: Secondary | ICD-10-CM

## 2020-07-18 DIAGNOSIS — E1165 Type 2 diabetes mellitus with hyperglycemia: Secondary | ICD-10-CM | POA: Diagnosis present

## 2020-07-18 DIAGNOSIS — G2581 Restless legs syndrome: Secondary | ICD-10-CM | POA: Diagnosis present

## 2020-07-18 DIAGNOSIS — I2510 Atherosclerotic heart disease of native coronary artery without angina pectoris: Principal | ICD-10-CM

## 2020-07-18 DIAGNOSIS — Z886 Allergy status to analgesic agent status: Secondary | ICD-10-CM | POA: Diagnosis not present

## 2020-07-18 DIAGNOSIS — I130 Hypertensive heart and chronic kidney disease with heart failure and stage 1 through stage 4 chronic kidney disease, or unspecified chronic kidney disease: Secondary | ICD-10-CM | POA: Diagnosis present

## 2020-07-18 DIAGNOSIS — N1832 Chronic kidney disease, stage 3b: Secondary | ICD-10-CM | POA: Diagnosis present

## 2020-07-18 DIAGNOSIS — Z79899 Other long term (current) drug therapy: Secondary | ICD-10-CM

## 2020-07-18 DIAGNOSIS — K219 Gastro-esophageal reflux disease without esophagitis: Secondary | ICD-10-CM | POA: Diagnosis present

## 2020-07-18 DIAGNOSIS — I5032 Chronic diastolic (congestive) heart failure: Secondary | ICD-10-CM | POA: Diagnosis present

## 2020-07-18 DIAGNOSIS — Z888 Allergy status to other drugs, medicaments and biological substances status: Secondary | ICD-10-CM | POA: Diagnosis not present

## 2020-07-18 DIAGNOSIS — N179 Acute kidney failure, unspecified: Secondary | ICD-10-CM | POA: Diagnosis present

## 2020-07-18 DIAGNOSIS — Z8049 Family history of malignant neoplasm of other genital organs: Secondary | ICD-10-CM

## 2020-07-18 DIAGNOSIS — Z825 Family history of asthma and other chronic lower respiratory diseases: Secondary | ICD-10-CM

## 2020-07-18 DIAGNOSIS — E1149 Type 2 diabetes mellitus with other diabetic neurological complication: Secondary | ICD-10-CM | POA: Diagnosis present

## 2020-07-18 DIAGNOSIS — R7989 Other specified abnormal findings of blood chemistry: Secondary | ICD-10-CM | POA: Diagnosis not present

## 2020-07-18 DIAGNOSIS — E118 Type 2 diabetes mellitus with unspecified complications: Secondary | ICD-10-CM | POA: Diagnosis not present

## 2020-07-18 DIAGNOSIS — IMO0002 Reserved for concepts with insufficient information to code with codable children: Secondary | ICD-10-CM | POA: Diagnosis present

## 2020-07-18 DIAGNOSIS — R0602 Shortness of breath: Secondary | ICD-10-CM

## 2020-07-18 DIAGNOSIS — Z20822 Contact with and (suspected) exposure to covid-19: Secondary | ICD-10-CM | POA: Diagnosis present

## 2020-07-18 DIAGNOSIS — R079 Chest pain, unspecified: Secondary | ICD-10-CM | POA: Diagnosis present

## 2020-07-18 DIAGNOSIS — Z823 Family history of stroke: Secondary | ICD-10-CM

## 2020-07-18 DIAGNOSIS — Z885 Allergy status to narcotic agent status: Secondary | ICD-10-CM

## 2020-07-18 DIAGNOSIS — Z85820 Personal history of malignant melanoma of skin: Secondary | ICD-10-CM | POA: Diagnosis not present

## 2020-07-18 DIAGNOSIS — Z8249 Family history of ischemic heart disease and other diseases of the circulatory system: Secondary | ICD-10-CM

## 2020-07-18 DIAGNOSIS — Z79811 Long term (current) use of aromatase inhibitors: Secondary | ICD-10-CM

## 2020-07-18 DIAGNOSIS — E11610 Type 2 diabetes mellitus with diabetic neuropathic arthropathy: Secondary | ICD-10-CM | POA: Diagnosis present

## 2020-07-18 DIAGNOSIS — Z981 Arthrodesis status: Secondary | ICD-10-CM

## 2020-07-18 DIAGNOSIS — R0902 Hypoxemia: Secondary | ICD-10-CM

## 2020-07-18 DIAGNOSIS — R262 Difficulty in walking, not elsewhere classified: Secondary | ICD-10-CM | POA: Diagnosis present

## 2020-07-18 DIAGNOSIS — Z923 Personal history of irradiation: Secondary | ICD-10-CM

## 2020-07-18 DIAGNOSIS — N390 Urinary tract infection, site not specified: Secondary | ICD-10-CM | POA: Diagnosis present

## 2020-07-18 DIAGNOSIS — Z800 Family history of malignant neoplasm of digestive organs: Secondary | ICD-10-CM

## 2020-07-18 DIAGNOSIS — E1122 Type 2 diabetes mellitus with diabetic chronic kidney disease: Secondary | ICD-10-CM | POA: Diagnosis present

## 2020-07-18 DIAGNOSIS — K279 Peptic ulcer, site unspecified, unspecified as acute or chronic, without hemorrhage or perforation: Secondary | ICD-10-CM | POA: Diagnosis present

## 2020-07-18 DIAGNOSIS — E785 Hyperlipidemia, unspecified: Secondary | ICD-10-CM | POA: Diagnosis present

## 2020-07-18 LAB — CBC WITH DIFFERENTIAL/PLATELET
Abs Immature Granulocytes: 0.05 10*3/uL (ref 0.00–0.07)
Basophils Absolute: 0 10*3/uL (ref 0.0–0.1)
Basophils Relative: 0 %
Eosinophils Absolute: 0.2 10*3/uL (ref 0.0–0.5)
Eosinophils Relative: 2 %
HCT: 43.4 % (ref 36.0–46.0)
Hemoglobin: 14 g/dL (ref 12.0–15.0)
Immature Granulocytes: 0 %
Lymphocytes Relative: 7 %
Lymphs Abs: 0.8 10*3/uL (ref 0.7–4.0)
MCH: 27.6 pg (ref 26.0–34.0)
MCHC: 32.3 g/dL (ref 30.0–36.0)
MCV: 85.6 fL (ref 80.0–100.0)
Monocytes Absolute: 0.9 10*3/uL (ref 0.1–1.0)
Monocytes Relative: 7 %
Neutro Abs: 10.3 10*3/uL — ABNORMAL HIGH (ref 1.7–7.7)
Neutrophils Relative %: 84 %
Platelets: 251 10*3/uL (ref 150–400)
RBC: 5.07 MIL/uL (ref 3.87–5.11)
RDW: 16.4 % — ABNORMAL HIGH (ref 11.5–15.5)
WBC: 12.3 10*3/uL — ABNORMAL HIGH (ref 4.0–10.5)
nRBC: 0 % (ref 0.0–0.2)

## 2020-07-18 LAB — TROPONIN I (HIGH SENSITIVITY)
Troponin I (High Sensitivity): 11 ng/L (ref <18)
Troponin I (High Sensitivity): 9 ng/L (ref <18)

## 2020-07-18 LAB — COMPREHENSIVE METABOLIC PANEL
ALT: 22 U/L (ref 0–44)
AST: 33 U/L (ref 15–41)
Albumin: 3.2 g/dL — ABNORMAL LOW (ref 3.5–5.0)
Alkaline Phosphatase: 110 U/L (ref 38–126)
Anion gap: 14 (ref 5–15)
BUN: 20 mg/dL (ref 8–23)
CO2: 24 mmol/L (ref 22–32)
Calcium: 9.5 mg/dL (ref 8.9–10.3)
Chloride: 91 mmol/L — ABNORMAL LOW (ref 98–111)
Creatinine, Ser: 1.75 mg/dL — ABNORMAL HIGH (ref 0.44–1.00)
GFR, Estimated: 29 mL/min — ABNORMAL LOW (ref >60)
Glucose, Bld: 417 mg/dL — ABNORMAL HIGH (ref 70–99)
Potassium: 4.5 mmol/L (ref 3.5–5.1)
Sodium: 129 mmol/L — ABNORMAL LOW (ref 135–145)
Total Bilirubin: 0.5 mg/dL (ref 0.3–1.2)
Total Protein: 7 g/dL (ref 6.5–8.1)

## 2020-07-18 NOTE — ED Provider Notes (Signed)
[facility] EMERGENCY DEPARTMENT Provider Note   CSN: [id number] Arrival date & time: 07/18/20  2003     History Chief Complaint  Patient presents with  . Chest Pain  . Shortness of Breath    [name] is a 80 y.o. female.  Patient is an 80 year old female with history of coronary artery disease status post angioplasty with metal stents in 2007, diabetes, bilateral Charcot foot, congestive heart failure, restless legs.  Patient presents today for evaluation of chest discomfort.  She describes a sharp pain in the center of her chest that started earlier today while sitting in her recliner.  She denies any injury or trauma.  She reports feeling somewhat short of breath when symptoms began, but denies any nausea, diaphoresis, or radiation of the pain.  Pain has improved, but she continues to have what she describes as "a pressure".    She describes her self as being a very active person, however has had difficulty dealing psychologically with loss of mobility secondary to her Charcot feet.  She feels as though some of her symptoms may be anxiety related.  The history is provided by the patient.  Chest Pain Pain location:  Substernal area Pain quality: tightness   Pain radiates to:  Does not radiate Pain severity:  Moderate Onset quality:  Sudden Duration:  10 hours Timing:  Constant Progression:  Partially resolved Chronicity:  New Relieved by:  Nothing Worsened by:  Nothing Ineffective treatments:  None tried Associated symptoms: no nausea        Past Medical History:  Diagnosis Date  . Anemia    have received iron infusions  . Anxiety   . Arthritis     osteoarthritis  . Breast cancer ([place])   . Breast cancer in female [facility])    Bilateral  . CAD S/P percutaneous coronary angioplasty 2007; March 2011   [employer] 3.0 mm 20 mm postdilated 3.6 mm in early mid LAD; status post ISR Cutting Balloon PTCA and March 11 along with PCI of distal mid  lesion with a 3.0 mm 12 mm MultiLink vision BMS; the proximal stent causes jailing of SP1 and SP2 with ostial 70-80% lesions  . Cancer (HCC)    Melanoma, Squamous cell Carcinoma  . Charcot's joint of foot, right   . Charcot's joint of left foot   . Chronic diastolic CHF (congestive heart failure), NYHA class 2 ([place]) 02/28/2018  . Chronic kidney disease    stage 2   . Coronary artery disease   . Dementia ([place])    mild  . Diabetes mellitus type 2 with neurological manifestations ([place])   . Diabetes mellitus without complication ([place])   . Dyslipidemia, goal LDL below 70   . Foot pain, bilateral   . Full dentures   . Genetic testing 12/03/2016   Germline genetic testing was performed through [employer]'s Common Hereditary Cancers Panel + [employer]'s Melanoma Panel. This custom panel includes analysis of the following 51 genes: APC, ATM, AXIN2, BAP1, BARD1, BMPR1A, BRCA1, BRCA2, BRIP1, CDH1, CDK4, CDKN2A, CHEK2, CTNNA1, DICER1, EPCAM, GREM1, HOXB13, KIT, MEN1, MITF, MLH1, MSH2, MSH3, MSH6, MUTYH, NBN, NF1, NTHL1, PALB2, PDGFRA, PMS2, POLD1, POL  . GERD (gastroesophageal reflux disease)   . Gout   . Headache(784.0)   . History of hematuria    Followed by Dr. Gaynelle Arabian  . Hypertension, essential, benign   . Migraine   . Mild cognitive impairment   . Peripheral neuropathy   . Personal history of radiation therapy   .  Post herpetic neuralgia   . Restless leg syndrome   . Stroke ([place])   . TIA (transient ischemic attack)    multiple in the past  . TIA (transient ischemic attack)   . Wears glasses     Patient Active Problem List   Diagnosis Date Noted  . Pulmonary emboli ([place]) 02/24/2020  . Pulmonary nodule 02/21/2020  . Acute respiratory failure with hypoxia ([place]) 02/21/2020  . S/p left hip fracture 02/21/2020  . History of CVA (cerebrovascular accident) 02/21/2020  . Acute renal failure superimposed on stage 4 chronic kidney disease ([place]) 02/21/2020  . Acute blood loss anemia 02/19/2020   . GIB (gastrointestinal bleeding)   . Subdural hemorrhage ([place]) 02/15/2020  . Hip fracture, left, closed, initial encounter ([place]) 02/08/2020  . CAD (coronary artery disease) 02/08/2020  . CKD (chronic kidney disease) stage 4, GFR 15-29 ml/min (HCC) 02/08/2020  . Chronic diastolic CHF (congestive heart failure) ([place]) 02/08/2020  . Type 2 diabetes mellitus with vascular disease ([place]) 02/08/2020  . Hip fracture ([place]) 02/08/2020  . Closed fracture of neck of left femur ([place]) 02/08/2020  . Somnolence, daytime 04/06/2018  . Chronic diastolic CHF (congestive heart failure), NYHA class 2 ([place]) 02/28/2018  . Hypotension   . Hypomagnesemia 02/25/2018  . Hypokalemia 02/24/2018  . Urinary retention 02/24/2018  . Posterior tibial tendinitis of right leg 02/22/2018  . Posterior tibial tendinitis, right leg   . Genetic testing 12/03/2016  . 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 Malignant neoplasm of upper-outer quadrant of left breast in female, estrogen receptor positive ([place]) 11/19/2016  . Chronic idiopathic gout involving toe of left foot without tophus 11/15/2016  . Malignant 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, estrogen receptor positive ([place]) 11/09/2016  . Tendon tear, ankle, left, sequela   . Traumatic rupture of left anterior tibial tendon 04/20/2016  . Acute kidney injury superimposed on CKD ([place])   . 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 ([place])   . Headache, migraine   . 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 Restless legs syndrome 07/24/2015  . Mild cognitive impairment 01/08/2015  . Abnormality of gait 01/08/2015  . Dizziness 12/19/2014  . Preoperative cardiovascular examination 12/11/2013  . GERD (gastroesophageal reflux disease) 10/06/2013  . Syncope 10/05/2013  . Anxiety 10/28/2012    Class: Acute  . Bilateral lower extremity edema 10/28/2012  . CAD S/P percutaneous coronary angioplasty   . Essential  hypertension   . Dyslipidemia, goal LDL below 70     Past Surgical History:  Procedure Laterality Date  . ABDOMINAL HYSTERECTOMY    . ANKLE FUSION Right 02/22/2018   Procedure: RIGHT SUBTALAR AND TALONAVICULAR FUSION;  Surgeon: Newt Minion, MD;  Location: [place];  Service: Orthopedics;  Laterality: Right;  . APPENDECTOMY    . BIOPSY  02/19/2020   Procedure: BIOPSY;  Surgeon: Otis Brace, MD;  Location: [place];  Service: Gastroenterology;;  . BLADDER SUSPENSION    . BREAST LUMPECTOMY Bilateral 2018  . BREAST LUMPECTOMY WITH RADIOACTIVE SEED AND SENTINEL LYMPH NODE BIOPSY Bilateral 12/14/2016   Procedure: BILATERAL BREAST LUMPECTOMY WITH BILATERAL  RADIOACTIVE SEED AND BILATERAL AXILLARY  SENTINEL LYMPH NODE BIOPSY ERAS PATHWAY;  Surgeon: Alphonsa Overall, MD;  Location: [place];  Service: General;  Laterality: Bilateral;  PECTORAL BLOCK  . CARDIAC CATHETERIZATION  02/24/2006   85% stenosis in the proximal portion of LAD-arrangements made for PCI on 02/25/2006  . CARDIAC CATHETERIZATION  02/25/2006   80% LAD  lesion stented with a 3x94mm [employer] stent resulting in reduction of 80% lesion to 0% residual  . CARDIAC CATHETERIZATION  04/26/2006   Medical management  . CARDIAC CATHETERIZATION  06/24/2009   60-70% re-stenosis in the proximal LAD. A 3.25x15 cutting balloon, 3 inflations - 14atm-38sec, 13atm-39sec, and 12atm-40sec reduced to less than 10%. 60% stenosis of the mid/distal LAD stented with a 3x22mm Multilink stent.  Marland Kitchen CARDIAC CATHETERIZATION  07/09/2009   Medical management  . CARDIAC CATHETERIZATION    . CAROTID DOPPLER  06/24/2009   40-59% R ICA stenosis. No significant ICA stenosis noted  . CHOLECYSTECTOMY    . CORONARY ANGIOPLASTY    . DILATION AND CURETTAGE OF UTERUS    . ESOPHAGOGASTRODUODENOSCOPY N/A 02/26/2020   Procedure: ESOPHAGOGASTRODUODENOSCOPY (EGD);  Surgeon: Clarene Essex, MD;  Location: [place];  Service: Endoscopy;  Laterality: N/A;  .  ESOPHAGOGASTRODUODENOSCOPY (EGD) WITH PROPOFOL N/A 02/19/2020   Procedure: ESOPHAGOGASTRODUODENOSCOPY (EGD) WITH PROPOFOL;  Surgeon: Otis Brace, MD;  Location: [facility] ENDOS[facility];  Service: Gastroenterology;  Laterality: N/A;  . ESOPHAGOGASTRODUODENOSCOPY (EGD) WITH PROPOFOL Left 02/23/2020   Procedure: ESOPHAGOGASTRODUODENOSCOPY (EGD) WITH PROPOFOL;  Surgeon: Arta Silence, MD;  [facility] END[facility]COPY;  Service: Endoscopy;  Laterality: Left;  . HIP ARTHROPLASTY Left 02/10/2020   Procedure: ARTHROPLASTY BIPOLAR HIP (HEMIARTHROPLASTY) POSTERIOR LATERAL;  Surgeon: [name], MD;  Location: [place];  Service: Orthopedics;  Laterality: Left;  . JOINT REPLACEMENT Left   . LESION REMOVAL Right 03/11/2015   Procedure:  EXCISION OF RIGHT PRE TIBIAL LESION;  Surgeon: Judeth Horn, MD;  Location: [place];  Service: General;  Laterality: Right;  . MASS EXCISION Left 06/10/2014   Procedure: EXCISION LEFT LOWER LEG LESION;  Surgeon: Doreen Salvage, MD;  Location: [place];  Service: General;  Laterality: Left;  Marland Kitchen MASS EXCISION Left 09/05/2015   Procedure: EXCISION OF LEFT FOREARM  MASS;  Surgeon: Judeth Horn, MD;  Location: [place];  Service: General;  Laterality: Left;  . NM MYOVIEW LTD  10/2011   No ischemia or infarction.  Normal EF.  LOW RISK. (Short bursts of 5 beats NSVT during recovery otherwise normal)  . POLYPECTOMY  02/19/2020   Procedure: POLYPECTOMY;  Surgeon: Otis Brace, MD;  Location: [place] ENDOSCOPY;  Service: Gastroenterology;;  . SHOULDER ARTHROSCOPY  10/15  . SHOULDER ARTHROSCOPY  1995   left  . TEE WITHOUT CARDIOVERSION N/A 08/03/2017   Procedure: TRANSESOPHAGEAL ECHOCARDIOGRAM (TEE);  Surgeon: Skeet Latch, MD;  Location: [place];  Service: Cardiovascular;; EF 60-65% with no wall motion normalities.  No atrial thrombus noted.  Lightly findings consistent with a lipomatous hypertrophy of the atrial septum.   . TENDON REPAIR  Left 05/12/2016   Procedure: Left Anterior Tibial Tendon Reconstruction;  Surgeon: Newt Minion, MD;  Location: [place];  Service: Orthopedics;  Laterality: Left;  . TOTAL KNEE ARTHROPLASTY  2005   left  . TRANSESOPHAGEAL ECHOCARDIOGRAM  08/03/2016   : EF 60-65% no wall motion normalities.  No atrial thrombus.  Lipomatous hypertrophy of the atrial septum.  No mass noted.  . TRANSESOPHAGEAL ECHOCARDIOGRAM  07/2017   EF 60 to 65%.  No R WMA.  No atrial thrombus noted.  Lipomatous IAS.  Marland Kitchen TRANSTHORACIC ECHOCARDIOGRAM  02/25/2017   :EF 65-70%.  GR 1 DD.  No or WMA.  Moderate aortic calcification/sclerosis but no stenosis.  . TRANSTHORACIC ECHOCARDIOGRAM  07/2017   Vigorous LV function.  EF 65-70%.  Mild diastolic dysfunction with no RWMA. GR 1 DD.  Mild aortic sclerosis/no stenosis.  Questionable right atrial mass discounted with TEE)   . WRIST ARTHROPLASTY  2010   cancer lt wrist     OB History   No obstetric history on file.     Family History  Problem Relation Age of Onset  . Diabetes Mother   . Kidney disease Mother   . Diabetes Sister   . Diabetes Brother   . Hypertension Mother   . Hypertension Father   . Emphysema Father   . Heart attack Father   . Heart disease Father   . Arthritis Sister   . Diabetes Sister   . Hypertension Sister   . Breast cancer Sister 61       treated with mastectomy  . Cervical cancer Sister 14  . Hypertension Brother   . Hyperlipidemia Brother   . Diabetes Brother   . Stroke Brother   . Diabetes Sister   . Hypertension Sister   . Stomach cancer Sister 28  . Hypertension Brother   . ALS Brother        d.62s  . Breast cancer Daughter 67       triple negative treated with lumpectomy, chemo, radiation  . Heart attack Daughter 45       d.45  . COPD Daughter   . Cancer Paternal Aunt        unspecified type  . Cancer Paternal Uncle        unspecified type  . Cancer Paternal Uncle        unspecified type    Social History   Tobacco Use   . Smoking status: Never Smoker  . Smokeless tobacco: Never Used  Vaping Use  . Vaping Use: Never used  Substance Use Topics  . Alcohol use: Never  . Drug use: Never    Home Medications Prior to Admission medications   Medication Sig Start Date End Date Taking? Authorizing Provider  BIOTIN PO Take 1 tablet by mouth daily.    [provider]  bumetanide (BUMEX) 2 MG tablet Take 2 mg by mouth daily. 2 in morning and 1 in afternoon    [provider]  Calcium Carb-Cholecalciferol 600-800 MG-UNIT TABS Take 1 tablet by mouth daily. Patient not taking: Reported on 04/04/2020    [provider]  Cholecalciferol (VITAMIN D) 50 MCG (2000 UT) tablet Take 2,000 Units by mouth daily.    [provider]  clopidogrel (PLAVIX) 75 MG tablet Take 75 mg by mouth daily.    [provider]  Coenzyme Q10 (COQ-10 PO) Take 1 tablet by mouth daily.    [provider]  COLCRYS 0.6 MG tablet TAKE 1 TABLET DAILY AS NEEDED FOR GOUT FLARE. Patient not taking: Reported on 04/04/2020 12/27/18   Nadara Mustard, MD  Cranberry 500 MG TABS Take 500 mg by mouth every evening.    [provider]  CRANBERRY-CALCIUM PO Take 1 tablet by mouth daily. Patient not taking: Reported on 04/04/2020    [provider]  diazepam (VALIUM) 5 MG tablet Take 5 mg by mouth every 12 (twelve) hours as needed for anxiety.    [provider]  exemestane (AROMASIN) 25 MG tablet TAKE 1 TABLET DAILY AFTER BREAKFAST 05/20/20   Malachy Mood, MD  febuxostat (ULORIC) 40 MG tablet Take 40 mg by mouth daily. 12/31/19   [provider]  gabapentin (NEURONTIN) 300 MG capsule Take 300 mg by mouth 2 (two) times daily.  12/18/19   [provider]  JANUVIA 25 MG tablet Take 25 mg by mouth daily. 12/26/19   [provider]  Javier Docker Oil 500 MG CAPS Take 500 mg by mouth daily.    [provider]  Methylfol-Methylcob-Acetylcyst (CEREFOLIN NAC) 6-2-600 MG TABS  TAKE 1 TABLET BY MOUTH EVERY DAY 06/07/19   Garvin Fila, MD  metolazone (ZAROXOLYN) 2.5 MG tablet Take 2.5 mg by mouth daily. 30 minutes before Bumetanide Mon. & Thurs.    [provider]  metoprolol succinate (TOPROL-XL) 25 MG 24 hr tablet Take 25 mg by mouth at bedtime.  12/27/19   [provider]  mirtazapine (REMERON) 15 MG tablet Take 15 mg by mouth at bedtime. Taking 1 1/2 tablet at bedtime    [provider]  Multiple Vitamin (MULTIVITAMIN WITH MINERALS) TABS tablet Take 1 tablet by mouth daily. 03/03/20   Terrilee Croak, MD  nitroGLYCERIN (NITROLINGUAL) 0.4 MG/SPRAY spray Place 1 spray under the tongue every 5 (five) minutes x 3 doses as needed for chest pain.    [provider]  pantoprazole (PROTONIX) 40 MG tablet Take 1 tablet (40 mg total) by mouth 2 (two) times daily before a meal. 03/02/20   Dahal, Marlowe Aschoff, MD  potassium chloride SA (KLOR-CON) 20 MEQ tablet Take 20 mEq by mouth 2 (two) times daily.    [provider]  pramipexole (MIRAPEX) 0.25 MG tablet TAKE 1 TO 2 TABLETS AS INSTRUCTED, 1 TABLET AT 4:00 P.M. AND 2 TABLETS AT BEDTIME 09/27/19   Frann Rider, NP  RESVERATROL-GRAPE PO Take 500 mg by mouth.    [provider]  rosuvastatin (CRESTOR) 20 MG tablet Take 20 mg by mouth daily.    [provider]    Allergies    Nsaids, Reglan [metoclopramide], Tramadol, Ultram [tramadol], Lipitor [atorvastatin], Lipitor [atorvastatin], Lyrica [pregabalin], Lyrica [pregabalin], Reglan [metoclopramide], Nsaids, and Urecholine [bethanechol]  Review of Systems   Review of Systems  Gastrointestinal: Negative for nausea.  All other systems reviewed and are negative.   Physical Exam Updated Vital Signs BP (!) 125/105   Pulse 94   Temp 98.1 F (36.7 C)   Resp 17   Ht $R'5\' 8"'Yx$  (1.727 m)   Wt 90.8 kg   SpO2 95%   BMI 30.44 kg/m   Physical Exam Vitals and nursing note reviewed.  Constitutional:      General: She is not in  acute distress.    Appearance: She is well-developed. She is not diaphoretic.  HENT:     Head: Normocephalic and atraumatic.  Cardiovascular:     Rate and Rhythm: Normal rate and regular rhythm.     Heart sounds: No murmur heard. No friction rub. No gallop.   Pulmonary:     Effort: Pulmonary effort is normal. No respiratory distress.     Breath sounds: Normal breath sounds. No wheezing.  Abdominal:     General: Bowel sounds are normal. There is no distension.     Palpations: Abdomen is soft.     Tenderness: There is no abdominal tenderness.  Musculoskeletal:        General: Normal range of motion.     Cervical back: Normal range of motion and neck supple.     Right lower leg: No tenderness. Edema present.     Left lower leg: No tenderness. Edema present.     Comments: There is nonpitting edema of both lower extremities.  Skin:    General: Skin is warm and dry.  Neurological:     Mental Status: She is  alert and oriented to person, place, and time.     ED Results / Procedures / Treatments   Labs (all labs ordered are listed, but only abnormal results are displayed) Labs Reviewed  COMPREHENSIVE METABOLIC PANEL - Abnormal; Notable for the following components:      Result Value   Sodium 129 (*)    Chloride 91 (*)    Glucose, Bld 417 (*)    Creatinine, Ser 1.75 (*)    Albumin 3.2 (*)    GFR, Estimated 29 (*)    All other components within normal limits  CBC WITH DIFFERENTIAL/PLATELET - Abnormal; Notable for the following components:   WBC 12.3 (*)    RDW 16.4 (*)    Neutro Abs 10.3 (*)    All other components within normal limits  TROPONIN I (HIGH SENSITIVITY)  TROPONIN I (HIGH SENSITIVITY)    EKG EKG Interpretation  Date/Time:  Friday July 18 2020 20:10:40 EDT Ventricular Rate:  116 PR Interval:  154 QRS Duration: 80 QT Interval:  320 QTC Calculation: 444 R Axis:   60 Text Interpretation: Sinus tachycardia Possible Anterior infarct , age undetermined Abnormal  ECG Confirmed by Veryl Speak [phone]) on 07/18/2020 11:23:07 PM   Radiology DG Chest 1 View  Result Date: 07/18/2020 CLINICAL DATA:  Chest pain and shortness of breath EXAM: CHEST  1 VIEW COMPARISON:  02/18/2020 FINDINGS: There is right basilar atelectasis. Calcific aortic atherosclerosis. No focal airspace consolidation or pulmonary edema. Normal pleural spaces. IMPRESSION: Right basilar atelectasis. Electronically Signed   By: Ulyses Jarred M.D.   On: 07/18/2020 21:06    Procedures Procedures   Medications Ordered in ED Medications - No data to display  ED Course  I have reviewed the triage vital signs and the nursing notes.  Pertinent labs & imaging results that were available during my care of the patient were reviewed by me and considered in my medical decision making (see chart for details).    MDM Rules/Calculators/A&P  Patient with history of coronary artery disease with stent x2 in 2007 presenting with chest discomfort as described in the HPI.  She arrives here with improving discomfort and stable vital signs.  Initial EKG shows no acute changes and troponin x2 is negative.  Due to the patient's history, I feel as though overnight observation is appropriate for further trending of her troponin and cardiac monitoring.  I have spoken with Dr. Myna Hidalgo who agrees to admit.  Final Clinical Impression(s) / ED Diagnoses Final diagnoses:  SOB (shortness of breath)    Rx / DC Orders ED Discharge Orders    None       Veryl Speak, MD 07/19/20 0104

## 2020-07-18 NOTE — ED Triage Notes (Signed)
Patient with chest pain and shortness of breath, reports pain all day, with some nausea, midchest radiating into L breast and back, "feels like smothering"

## 2020-07-18 NOTE — ED Triage Notes (Signed)
Emergency Medicine Provider Triage Evaluation Note  [name] , a 80 y.o. female  was evaluated in triage.  Pt complains of chest pain, shortness of breath.  Reports pain started 1000 this morning.  Pain has been constant since then.  Patient reports midsternal chest pain with radiation to left yesterday.  Patient describes pain as pressure.  Patient endorses nausea shortness of breath with her pain.  Denies any vomiting or diaphoresis.  Review of Systems  Positive: Chest pain, shortness of breath, nausea Negative: Fevers, chills, rhinorrhea, nasal congestion, cough, abdominal pain, vomiting, constipation, diarrhea  Physical Exam  BP 93/63 (BP Location: Left Arm)   Pulse (!) 114   Temp 98.1 F (36.7 C)   Resp (!) 26   Ht 5\' 8"  (1.727 m)   Wt 90.8 kg   SpO2 96%   BMI 30.44 kg/m  Gen:   Awake, no distress   HEENT:  Atraumatic  Resp:  Normal effort, lungs clear to auscultation bilaterally Cardiac:  Rate 114 Abd:   Nondistended, nontender  MSK:   Moves extremities without difficulty  Neuro:  Speech clear   Medical Decision Making  Medically screening exam initiated at 8:23 PM.  Appropriate orders placed.  Duffy Rhody Tuel was informed that the remainder of the evaluation will be completed by another provider, this initial triage assessment does not replace that evaluation, and the importance of remaining in the ED until their evaluation is complete.  Clinical Impression   The patient appears stable so that the remainder of the work up may be completed by another provider.      Loni Beckwith, [place] 07/18/20 2027

## 2020-07-19 ENCOUNTER — Encounter (HOSPITAL_COMMUNITY): Payer: Self-pay | Admitting: Family Medicine

## 2020-07-19 ENCOUNTER — Observation Stay (HOSPITAL_COMMUNITY): Payer: Medicare Other

## 2020-07-19 DIAGNOSIS — E1165 Type 2 diabetes mellitus with hyperglycemia: Secondary | ICD-10-CM | POA: Diagnosis present

## 2020-07-19 DIAGNOSIS — R7989 Other specified abnormal findings of blood chemistry: Secondary | ICD-10-CM

## 2020-07-19 DIAGNOSIS — Z885 Allergy status to narcotic agent status: Secondary | ICD-10-CM | POA: Diagnosis not present

## 2020-07-19 DIAGNOSIS — I130 Hypertensive heart and chronic kidney disease with heart failure and stage 1 through stage 4 chronic kidney disease, or unspecified chronic kidney disease: Secondary | ICD-10-CM | POA: Diagnosis present

## 2020-07-19 DIAGNOSIS — Z8673 Personal history of transient ischemic attack (TIA), and cerebral infarction without residual deficits: Secondary | ICD-10-CM

## 2020-07-19 DIAGNOSIS — Z85820 Personal history of malignant melanoma of skin: Secondary | ICD-10-CM | POA: Diagnosis not present

## 2020-07-19 DIAGNOSIS — N184 Chronic kidney disease, stage 4 (severe): Secondary | ICD-10-CM

## 2020-07-19 DIAGNOSIS — J9601 Acute respiratory failure with hypoxia: Secondary | ICD-10-CM

## 2020-07-19 DIAGNOSIS — Z853 Personal history of malignant neoplasm of breast: Secondary | ICD-10-CM | POA: Diagnosis not present

## 2020-07-19 DIAGNOSIS — R079 Chest pain, unspecified: Secondary | ICD-10-CM | POA: Diagnosis present

## 2020-07-19 DIAGNOSIS — R0602 Shortness of breath: Secondary | ICD-10-CM | POA: Diagnosis present

## 2020-07-19 DIAGNOSIS — Z955 Presence of coronary angioplasty implant and graft: Secondary | ICD-10-CM | POA: Diagnosis not present

## 2020-07-19 DIAGNOSIS — Z79811 Long term (current) use of aromatase inhibitors: Secondary | ICD-10-CM | POA: Diagnosis not present

## 2020-07-19 DIAGNOSIS — Z8711 Personal history of peptic ulcer disease: Secondary | ICD-10-CM | POA: Diagnosis not present

## 2020-07-19 DIAGNOSIS — I2510 Atherosclerotic heart disease of native coronary artery without angina pectoris: Secondary | ICD-10-CM | POA: Diagnosis present

## 2020-07-19 DIAGNOSIS — Z20822 Contact with and (suspected) exposure to covid-19: Secondary | ICD-10-CM | POA: Diagnosis present

## 2020-07-19 DIAGNOSIS — F0390 Unspecified dementia without behavioral disturbance: Secondary | ICD-10-CM | POA: Diagnosis present

## 2020-07-19 DIAGNOSIS — I5032 Chronic diastolic (congestive) heart failure: Secondary | ICD-10-CM | POA: Diagnosis present

## 2020-07-19 DIAGNOSIS — J9621 Acute and chronic respiratory failure with hypoxia: Secondary | ICD-10-CM | POA: Diagnosis present

## 2020-07-19 DIAGNOSIS — N179 Acute kidney failure, unspecified: Secondary | ICD-10-CM | POA: Diagnosis present

## 2020-07-19 DIAGNOSIS — E118 Type 2 diabetes mellitus with unspecified complications: Secondary | ICD-10-CM

## 2020-07-19 DIAGNOSIS — Z886 Allergy status to analgesic agent status: Secondary | ICD-10-CM | POA: Diagnosis not present

## 2020-07-19 DIAGNOSIS — Z8679 Personal history of other diseases of the circulatory system: Secondary | ICD-10-CM

## 2020-07-19 DIAGNOSIS — Z888 Allergy status to other drugs, medicaments and biological substances status: Secondary | ICD-10-CM | POA: Diagnosis not present

## 2020-07-19 DIAGNOSIS — K279 Peptic ulcer, site unspecified, unspecified as acute or chronic, without hemorrhage or perforation: Secondary | ICD-10-CM | POA: Diagnosis present

## 2020-07-19 DIAGNOSIS — E1149 Type 2 diabetes mellitus with other diabetic neurological complication: Secondary | ICD-10-CM | POA: Diagnosis present

## 2020-07-19 DIAGNOSIS — N1832 Chronic kidney disease, stage 3b: Secondary | ICD-10-CM | POA: Diagnosis present

## 2020-07-19 DIAGNOSIS — Z7902 Long term (current) use of antithrombotics/antiplatelets: Secondary | ICD-10-CM | POA: Diagnosis not present

## 2020-07-19 DIAGNOSIS — J9811 Atelectasis: Secondary | ICD-10-CM | POA: Diagnosis present

## 2020-07-19 DIAGNOSIS — N390 Urinary tract infection, site not specified: Secondary | ICD-10-CM | POA: Diagnosis present

## 2020-07-19 DIAGNOSIS — Z9861 Coronary angioplasty status: Secondary | ICD-10-CM

## 2020-07-19 DIAGNOSIS — F419 Anxiety disorder, unspecified: Secondary | ICD-10-CM | POA: Diagnosis present

## 2020-07-19 DIAGNOSIS — R262 Difficulty in walking, not elsewhere classified: Secondary | ICD-10-CM | POA: Diagnosis present

## 2020-07-19 LAB — CBC
HCT: 43.3 % (ref 36.0–46.0)
Hemoglobin: 14 g/dL (ref 12.0–15.0)
MCH: 27.4 pg (ref 26.0–34.0)
MCHC: 32.3 g/dL (ref 30.0–36.0)
MCV: 84.7 fL (ref 80.0–100.0)
Platelets: 215 10*3/uL (ref 150–400)
RBC: 5.11 MIL/uL (ref 3.87–5.11)
RDW: 16.5 % — ABNORMAL HIGH (ref 11.5–15.5)
WBC: 9.2 10*3/uL (ref 4.0–10.5)
nRBC: 0 % (ref 0.0–0.2)

## 2020-07-19 LAB — LIPASE, BLOOD: Lipase: 44 U/L (ref 11–51)

## 2020-07-19 LAB — GLUCOSE, CAPILLARY
Glucose-Capillary: 371 mg/dL — ABNORMAL HIGH (ref 70–99)
Glucose-Capillary: 274 mg/dL — ABNORMAL HIGH (ref 70–99)
Glucose-Capillary: 262 mg/dL — ABNORMAL HIGH (ref 70–99)
Glucose-Capillary: 305 mg/dL — ABNORMAL HIGH (ref 70–99)
Glucose-Capillary: 231 mg/dL — ABNORMAL HIGH (ref 70–99)
Glucose-Capillary: 216 mg/dL — ABNORMAL HIGH (ref 70–99)

## 2020-07-19 LAB — URINALYSIS, COMPLETE (UACMP) WITH MICROSCOPIC
Bacteria, UA: NONE SEEN
Bilirubin Urine: NEGATIVE
Glucose, UA: >=500 mg/dL — AB
Hgb urine dipstick: NEGATIVE
Ketones, ur: NEGATIVE mg/dL
Nitrite: POSITIVE — AB
Protein, ur: 30 mg/dL — AB
Specific Gravity, Urine: 1.011 (ref 1.005–1.030)
WBC, UA: >50 WBC/hpf — ABNORMAL HIGH (ref 0–5)
pH: 5 (ref 5.0–8.0)

## 2020-07-19 LAB — HEMOGLOBIN A1C
Hgb A1c MFr Bld: 12.8 % — ABNORMAL HIGH (ref 4.8–5.6)
Mean Plasma Glucose: 320.66 mg/dL

## 2020-07-19 LAB — D-DIMER, QUANTITATIVE: D-Dimer, Quant: 1.78 ug/mL-FEU — ABNORMAL HIGH (ref 0.00–0.50)

## 2020-07-19 LAB — BASIC METABOLIC PANEL
Anion gap: 12 (ref 5–15)
BUN: 20 mg/dL (ref 8–23)
CO2: 29 mmol/L (ref 22–32)
Calcium: 9.6 mg/dL (ref 8.9–10.3)
Chloride: 92 mmol/L — ABNORMAL LOW (ref 98–111)
Creatinine, Ser: 1.69 mg/dL — ABNORMAL HIGH (ref 0.44–1.00)
GFR, Estimated: 30 mL/min — ABNORMAL LOW (ref >60)
Glucose, Bld: 353 mg/dL — ABNORMAL HIGH (ref 70–99)
Potassium: 4.3 mmol/L (ref 3.5–5.1)
Sodium: 133 mmol/L — ABNORMAL LOW (ref 135–145)

## 2020-07-19 LAB — MAGNESIUM: Magnesium: 2.2 mg/dL (ref 1.7–2.4)

## 2020-07-19 LAB — SARS CORONAVIRUS 2 (TAT 6-24 HRS): SARS Coronavirus 2: NEGATIVE

## 2020-07-19 MED ORDER — PANTOPRAZOLE SODIUM 40 MG PO TBEC
40.0000 mg | DELAYED_RELEASE_TABLET | Freq: Two times a day (BID) | ORAL | Status: DC
  Administered 2020-07-19 – 2020-07-21 (×5): 40 mg via ORAL
  Filled 2020-07-19 (×5): qty 1

## 2020-07-19 MED ORDER — HEPARIN SODIUM (PORCINE) 5000 UNIT/ML IJ SOLN
5000.0000 [IU] | Freq: Three times a day (TID) | INTRAMUSCULAR | Status: DC
  Administered 2020-07-19 – 2020-07-21 (×7): 5000 [IU] via SUBCUTANEOUS
  Filled 2020-07-19 (×6): qty 1

## 2020-07-19 MED ORDER — ROSUVASTATIN CALCIUM 5 MG PO TABS
10.0000 mg | ORAL_TABLET | Freq: Every day | ORAL | Status: DC
  Administered 2020-07-19 – 2020-07-21 (×3): 10 mg via ORAL
  Filled 2020-07-19 (×3): qty 2

## 2020-07-19 MED ORDER — ONDANSETRON HCL 4 MG/2ML IJ SOLN: 4.0000 mg | Freq: Four times a day (QID) | INTRAMUSCULAR | Status: DC | PRN

## 2020-07-19 MED ORDER — INSULIN ASPART 100 UNIT/ML ~~LOC~~ SOLN
0.0000 [IU] | SUBCUTANEOUS | Status: DC
  Administered 2020-07-19: 7 [IU] via SUBCUTANEOUS
  Administered 2020-07-19: 5 [IU] via SUBCUTANEOUS
  Administered 2020-07-19: 3 [IU] via SUBCUTANEOUS
  Administered 2020-07-19: 9 [IU] via SUBCUTANEOUS
  Administered 2020-07-19 – 2020-07-20 (×2): 5 [IU] via SUBCUTANEOUS
  Administered 2020-07-20: 2 [IU] via SUBCUTANEOUS
  Administered 2020-07-20 (×2): 5 [IU] via SUBCUTANEOUS
  Administered 2020-07-20: 9 [IU] via SUBCUTANEOUS
  Administered 2020-07-20: 3 [IU] via SUBCUTANEOUS
  Administered 2020-07-21: 5 [IU] via SUBCUTANEOUS
  Administered 2020-07-21: 2 [IU] via SUBCUTANEOUS
  Administered 2020-07-21: 5 [IU] via SUBCUTANEOUS
  Administered 2020-07-21: 3 [IU] via SUBCUTANEOUS

## 2020-07-19 MED ORDER — EXEMESTANE 25 MG PO TABS
25.0000 mg | ORAL_TABLET | Freq: Every day | ORAL | Status: DC
  Administered 2020-07-19 – 2020-07-21 (×3): 25 mg via ORAL
  Filled 2020-07-19 (×4): qty 1

## 2020-07-19 MED ORDER — METOPROLOL SUCCINATE ER 25 MG PO TB24
25.0000 mg | ORAL_TABLET | Freq: Every day | ORAL | Status: DC
  Administered 2020-07-19 – 2020-07-20 (×3): 25 mg via ORAL
  Filled 2020-07-19 (×3): qty 1

## 2020-07-19 MED ORDER — ACETAMINOPHEN 650 MG RE SUPP: 650.0000 mg | Freq: Four times a day (QID) | RECTAL | Status: DC | PRN

## 2020-07-19 MED ORDER — SODIUM CHLORIDE 0.9 % IV SOLN
1.0000 g | Freq: Every day | INTRAVENOUS | Status: DC
  Administered 2020-07-19 – 2020-07-20 (×2): 1 g via INTRAVENOUS
  Filled 2020-07-19 (×3): qty 10

## 2020-07-19 MED ORDER — MIRTAZAPINE 15 MG PO TABS
15.0000 mg | ORAL_TABLET | Freq: Every day | ORAL | Status: DC
  Administered 2020-07-19 – 2020-07-20 (×2): 15 mg via ORAL
  Filled 2020-07-19 (×4): qty 1

## 2020-07-19 MED ORDER — SENNOSIDES-DOCUSATE SODIUM 8.6-50 MG PO TABS: 1.0000 | ORAL_TABLET | Freq: Every evening | ORAL | Status: DC | PRN

## 2020-07-19 MED ORDER — ACETAMINOPHEN 325 MG PO TABS
650.0000 mg | ORAL_TABLET | Freq: Four times a day (QID) | ORAL | Status: DC | PRN
  Administered 2020-07-19 – 2020-07-20 (×2): 650 mg via ORAL
  Filled 2020-07-19: qty 2

## 2020-07-19 MED ORDER — MIRTAZAPINE 15 MG PO TABS: 15.0000 mg | ORAL_TABLET | Freq: Every day | ORAL | Status: DC

## 2020-07-19 MED ORDER — NYSTATIN 100000 UNIT/GM EX POWD
Freq: Two times a day (BID) | CUTANEOUS | Status: DC
  Administered 2020-07-19 – 2020-07-20 (×2): 1 via TOPICAL
  Filled 2020-07-19: qty 15

## 2020-07-19 MED ORDER — LIDOCAINE VISCOUS HCL 2 % MT SOLN
15.0000 mL | Freq: Once | OROMUCOSAL | Status: DC | PRN
  Filled 2020-07-19: qty 15

## 2020-07-19 MED ORDER — NITROGLYCERIN 0.4 MG SL SUBL: 0.4000 mg | SUBLINGUAL_TABLET | SUBLINGUAL | Status: DC | PRN

## 2020-07-19 MED ORDER — LORAZEPAM 0.5 MG PO TABS: 0.5000 mg | ORAL_TABLET | Freq: Once | ORAL | Status: DC | PRN

## 2020-07-19 MED ORDER — HYDROCODONE-ACETAMINOPHEN 5-325 MG PO TABS: 1.0000 | ORAL_TABLET | ORAL | Status: DC | PRN

## 2020-07-19 MED ORDER — CLOPIDOGREL BISULFATE 75 MG PO TABS: 75.0000 mg | ORAL_TABLET | Freq: Every day | ORAL | Status: DC

## 2020-07-19 MED ORDER — ONDANSETRON HCL 4 MG PO TABS: 4.0000 mg | ORAL_TABLET | Freq: Four times a day (QID) | ORAL | Status: DC | PRN

## 2020-07-19 MED ORDER — ALUM & MAG HYDROXIDE-SIMETH 200-200-20 MG/5ML PO SUSP: 30.0000 mL | Freq: Once | ORAL | Status: DC | PRN

## 2020-07-19 NOTE — Progress Notes (Signed)
PROGRESS NOTE  [name] WYO:[id number] DOB: June 23, 1940 DOA: 07/18/2020 PCP: [name], DO  HPI/Recap of past 24 hours: [name] is a 80 y.o. female with medical history significant for CAD with stents in 2007, CVA, chronic diastolic CHF, breast cancer, type 2 diabetes mellitus, peptic ulcer disease, and anxiety, chronic hypoxemia on 2 L as needed, now presenting to the emergency department with 1 day of chest pain.  Patient reports that she has been under significant stress recently that she attributes to a previously active lifestyle but now with difficulty walking due to Charcot foot.  She then developed pain in the central chest while at rest yesterday (07/18/20) morning and has had constant pain since then that she describes as a tightness, moderate in intensity, some radiation towards the left, associated with nausea without vomiting, and also associated with shortness of breath.  She reports that her chronic bilateral lower extremity swelling is not as bad as usual recently.  She reports a mild nonproductive cough that persists after a recent respiratory illness.  She is not sure of most of the medications that she is taking.    ED Course: Upon arrival to the ED, patient is found to be afebrile, saturating mid 90s on 2 L/min supplemental oxygen, tachycardic in the 110s, and with stable blood pressure.  EKG features sinus tachycardia with rate 116.  Chest x-[place] notable for right basilar atelectasis.  Chemistry panel features a glucose of 417 and creatinine 1.75.  CBC notable for leukocytosis to 12,300.  High-sensitivity troponin is normal x2.  COVID-19 screening test not yet resulted.  07/19/20: Seen and examined at her bedside.  Denies chest pain at the time of this exam.  Reports generalized weakness.  States she uses 2L [place] at home PRN.  VQ scan and B/L LE doppler US pending.   Assessment/Plan: Principal Problem:   Chest pain Active Problems:   CAD S/P percutaneous coronary  angioplasty   Anxiety   Mild cognitive impairment   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 (HCC)   Chronic diastolic CHF (congestive heart failure), NYHA class 2 (HCC)   CKD (chronic kidney disease) stage 4, GFR 15-29 ml/min (HCC)   Acute respiratory failure with hypoxia (HCC)   History of CVA (cerebrovascular accident)   PUD (peptic ulcer disease)  1. Chest pain; CAD   - Presents with one day of constant chest pain with nausea and mild SOB and is found to be tachycardic with new supplemental O2 requirement and normal HS troponin x2  - She has hx of CAD with stents placed in 2007  - She has hx of PUD and recent reflux sxs, no longer taking PPI regularly  - Check d-dimer, repeat EKG once rate slowed, trial GI cocktail, trial NTG    2. Acute hypoxic respiratory failure  - Requiring 2 Lpm supplemental O2 to maintain saturations in low to mid 90s in ED  - No apparent respiratory distress, right base atelectasis on CXR  - Chest pain is not pleuritic and mild cough non-productive  - Check d-dimer, continue supplemental O2 as needed    3. Uncontrolled type II DM  - Serum glucose 417 on admission without DKA  - Patient and her daughter unsure what medications she is taking at home for diabetes  - A1c was 8.8% in November 2021  - Check CBGs and use SSI for now    4. History of CVA   - Continue Plavix and statin    ADDENDUM:  patient not sure if she resumed Plavix after GI bleed in December. Has not been filled since then per pharmacy, will hold for now.    5. CKD IV  - SCr is 1.75 on admission; was 1.2 in December 2021 though patient's daughter reports pt has CKD IV   - Renally-dose medications, monitor    6. Breast cancer  - Continue Aromasin   7. Chronic diastolic CHF  - Appears compensated, EF was 60-65% in November 2021  - Patient reports recent change in diuretics but asks that we call her husband tomorrow to clarify what she is taking, plan to [facility] 2 for  now   8. UTI  - Patient complained of dysuria, UA consistent with infection  - Culture urine, start Rocephin    DVT prophylaxis: sq heparin  Code Status: Full  Level of Care: Level of care: Telemetry Cardiac Family Communication: Daughter updated at bedside Disposition Plan:  Patient is from: home  Anticipated d/c is to: Home  Anticipated d/c date is: 07/20/20 Patient currently: Pending lipase level, d-dimer, trial of GI cocktail and/or nitroglycerin  Consults called: None    Status is: Inpatient   Dispo:  Patient From: Home  Planned Disposition: Home  Medically stable for discharge: No          Objective: Vitals:   07/19/20 0203 07/19/20 0441 07/19/20 0814 07/19/20 1135  BP: 137/86 (!) 148/86 137/81 114/67  Pulse: (!) 102 93 86 82  Resp: 14 16 18 16   Temp: 98.2 F (36.8 C) 98.1 F (36.7 C) 98 F (36.7 C) 98.2 F (36.8 C)  TempSrc: Oral Oral Oral Oral  SpO2: 96% 95% 92% 93%  Weight: 79.4 kg     Height: 5\' 7"  (1.702 m)      No intake or output data in the 24 hours ending 07/19/20 1137 Filed Weights   07/18/20 2018 07/19/20 0203  Weight: 90.8 kg 79.4 kg    Exam:  . General: 80 y.o. year-old female well developed well nourished in no acute distress.  Alert and oriented x3. . Cardiovascular: Regular rate and rhythm with no rubs or gallops.  No thyromegaly or JVD noted.   Marland Kitchen Respiratory: Clear to auscultation with no wheezes or rales. Good inspiratory effort. . Abdomen: Soft nontender nondistended with normal bowel sounds x4 quadrants. . Musculoskeletal: B/L lower extremity edema. 2/4 pulses in all 4 extremities. . Skin: No ulcerative lesions noted or rashes, . Psychiatry: Mood is appropriate for condition and setting   Data Reviewed: CBC: Recent Labs  Lab 07/18/20 2030 07/19/20 0237  WBC 12.3* 9.2  NEUTROABS 10.3*  --   HGB 14.0 14.0  HCT 43.4 43.3  MCV 85.6 84.7  PLT 251 160   Basic Metabolic Panel: Recent Labs  Lab 07/18/20 2030  07/19/20 0237  NA 129* 133*  K 4.5 4.3  CL 91* 92*  CO2 24 29  GLUCOSE 417* 353*  BUN 20 20  CREATININE 1.75* 1.69*  CALCIUM 9.5 9.6  MG  --  2.2   GFR: Estimated Creatinine Clearance: 28.3 mL/min (A) (by C-G formula based on SCr of 1.69 mg/dL (H)). Liver Function Tests: Recent Labs  Lab 07/18/20 2030  AST 33  ALT 22  ALKPHOS 110  BILITOT 0.5  PROT 7.0  ALBUMIN 3.2*   Recent Labs  Lab 07/19/20 0237  LIPASE 44   No results for input(s): AMMONIA in the last 168 hours. Coagulation Profile: No results for input(s): INR, PROTIME in the last 168 hours. Cardiac Enzymes:  No results for input(s): CKTOTAL, CKMB, CKMBINDEX, TROPONINI in the last 168 hours. BNP (last 3 results) No results for input(s): PROBNP in the last 8760 hours. HbA1C: Recent Labs    07/19/20 0237  HGBA1C 12.8*   CBG: Recent Labs  Lab 07/19/20 0259 07/19/20 0816 07/19/20 1135  GLUCAP 371* 274* 262*   Lipid Profile: No results for input(s): CHOL, HDL, LDLCALC, TRIG, CHOLHDL, LDLDIRECT in the last 72 hours. Thyroid Function Tests: No results for input(s): TSH, T4TOTAL, FREET4, T3FREE, THYROIDAB in the last 72 hours. Anemia Panel: No results for input(s): VITAMINB12, FOLATE, FERRITIN, TIBC, IRON, RETICCTPCT in the last 72 hours. Urine analysis:    Component Value Date/Time   COLORURINE AMBER (A) 07/19/2020 0307   APPEARANCEUR CLOUDY (A) 07/19/2020 0307   LABSPEC 1.011 07/19/2020 0307   PHURINE 5.0 07/19/2020 0307   GLUCOSEU >=500 (A) 07/19/2020 0307   HGBUR NEGATIVE 07/19/2020 0307   BILIRUBINUR NEGATIVE 07/19/2020 0307   KETONESUR NEGATIVE 07/19/2020 0307   PROTEINUR 30 (A) 07/19/2020 0307   UROBILINOGEN 0.2 10/05/2013 0059   NITRITE POSITIVE (A) 07/19/2020 0307   LEUKOCYTESUR LARGE (A) 07/19/2020 0307   Sepsis Labs: @LABRCNTIP (procalcitonin:4,lacticidven:4)  ) Recent Results (from the past 240 hour(s))  SARS CORONAVIRUS 2 (TAT 6-24 HRS) Nasopharyngeal Nasopharyngeal Swab      Status: None   Collection Time: 07/19/20 12:37 AM   Specimen: Nasopharyngeal Swab  Result Value Ref Range Status   SARS Coronavirus 2 NEGATIVE NEGATIVE Final    Comment: (NOTE) SARS-CoV-2 target nucleic acids are NOT DETECTED.  The SARS-CoV-2 RNA is generally detectable in upper and lower respiratory specimens during the acute phase of infection. Negative results do not preclude SARS-CoV-2 infection, do not rule out co-infections with other pathogens, and should not be used as the sole basis for treatment or other patient management decisions. Negative results must be combined with clinical observations, patient history, and epidemiological information. The expected result is Negative.  Fact Sheet for Patients: SugarRoll.be  Fact Sheet for Healthcare Providers: https://www.woods-mathews.com/  This test is not yet approved or cleared by the [place] FDA and  has been authorized for detection and/or diagnosis of SARS-CoV-2 by FDA under an Emergency Use Authorization (EUA). This EUA will remain  in effect (meaning this test can be used) for the duration of the COVID-19 declaration under Se ction 564(b)(1) of the Act, 21 U.S.C. section 360bbb-3(b)(1), unless the authorization is terminated or revoked sooner.  Performed at [facility], [place] [address] [place], [place] [address]       Studies: DG Chest 1 View  Result Date: 07/18/2020 CLINICAL DATA:  Chest pain and shortness of breath EXAM: CHEST  1 VIEW COMPARISON:  02/18/2020 FINDINGS: There is right basilar atelectasis. Calcific aortic atherosclerosis. No focal airspace consolidation or pulmonary edema. Normal pleural spaces. IMPRESSION: Right basilar atelectasis. Electronically Signed   By: Ulyses Jarred M.D.   On: 07/18/2020 21:06   VAS [place] LOWER EXTREMITY VENOUS (DVT)  Result Date: 07/19/2020  Lower Venous DVT Study Patient Name:  [facility] GARNER Biagini  Date of Exam:   07/19/2020  Medical Rec #: [id number]         Accession #:    [id number] Date of Birth: 1940/08/01         Patient Gender: F Patient Age:   79Y Exam Location:  [facility] Procedure:      VAS [place] LOWER EXTREMITY VENOUS (DVT) Referring Phys: [id number] [place] --------------------------------------------------------------------------------  Indications: Elevated Ddimer.  Risk  Factors: None identified. Comparison Study: No prior studies. Performing Technologist: Oliver Hum RVT  Examination Guidelines: A complete evaluation includes B-mode imaging, spectral Doppler, color Doppler, and power Doppler as needed of all accessible portions of each vessel. Bilateral testing is considered an integral part of a complete examination. Limited examinations for reoccurring indications may be performed as noted. The reflux portion of the exam is performed with the patient in reverse Trendelenburg.  +---------+---------------+---------+-----------+----------+--------------+ RIGHT    CompressibilityPhasicitySpontaneityPropertiesThrombus Aging +---------+---------------+---------+-----------+----------+--------------+ CFV      Full           Yes      Yes                                 +---------+---------------+---------+-----------+----------+--------------+ SFJ      Full                                                        +---------+---------------+---------+-----------+----------+--------------+ FV Prox  Full                                                        +---------+---------------+---------+-----------+----------+--------------+ FV Mid   Full                                                        +---------+---------------+---------+-----------+----------+--------------+ FV DistalFull                                                        +---------+---------------+---------+-----------+----------+--------------+ PFV      Full                                                         +---------+---------------+---------+-----------+----------+--------------+ POP      Full           Yes      Yes                                 +---------+---------------+---------+-----------+----------+--------------+ PTV      Full                                                        +---------+---------------+---------+-----------+----------+--------------+ PERO     Full                                                        +---------+---------------+---------+-----------+----------+--------------+   +---------+---------------+---------+-----------+----------+--------------+  LEFT     CompressibilityPhasicitySpontaneityPropertiesThrombus Aging +---------+---------------+---------+-----------+----------+--------------+ CFV      Full           Yes      Yes                                 +---------+---------------+---------+-----------+----------+--------------+ SFJ      Full                                                        +---------+---------------+---------+-----------+----------+--------------+ FV Prox  Full                                                        +---------+---------------+---------+-----------+----------+--------------+ FV Mid   Full                                                        +---------+---------------+---------+-----------+----------+--------------+ FV DistalFull                                                        +---------+---------------+---------+-----------+----------+--------------+ PFV      Full                                                        +---------+---------------+---------+-----------+----------+--------------+ POP      Full           Yes      Yes                                 +---------+---------------+---------+-----------+----------+--------------+ PTV      Full                                                         +---------+---------------+---------+-----------+----------+--------------+ PERO     Full                                                        +---------+---------------+---------+-----------+----------+--------------+     Summary: RIGHT: - There is no evidence of deep vein thrombosis in the lower extremity.  - No cystic structure found in the popliteal fossa.  LEFT: - There is no evidence of deep vein thrombosis in the lower extremity.  - No  cystic structure found in the popliteal fossa.  *See table(s) above for measurements and observations.    Preliminary     Scheduled Meds: . exemestane  25 mg Oral Daily  . heparin  5,000 Units Subcutaneous Q8H  . insulin aspart  0-9 Units Subcutaneous Q4H  . metoprolol succinate  25 mg Oral QHS  . mirtazapine  15 mg Oral QHS  . nystatin   Topical BID  . pantoprazole  40 mg Oral BID AC  . rosuvastatin  10 mg Oral Daily    Continuous Infusions: . cefTRIAXone (ROCEPHIN)  IV 1 g (07/19/20 0657)     LOS: 0 days     Kayleen Memos, MD [facility] Pager [phone]  If 7PM-7AM, please contact night-coverage [email or web address] Password [facility] 07/19/2020, 11:37 AM

## 2020-07-19 NOTE — ED Notes (Signed)
Family updated and at bedside

## 2020-07-19 NOTE — Progress Notes (Signed)
Bilateral lower extremity venous duplex has been completed. Preliminary results can be found in CV Proc through chart review.   07/19/20 11:14 AM [name] RVT

## 2020-07-19 NOTE — H&P (Addendum)
History and Physical    [name] NIO:[id number] DOB: 1940-09-23 DOA: 07/18/2020  PCP: [name], DO   Patient coming from: Home   Chief Complaint: Chest pain   HPI: [name] is a 80 y.o. female with medical history significant for CAD with stents in 2007, CVA, chronic diastolic CHF, breast cancer, type 2 diabetes mellitus, peptic ulcer disease, and anxiety, now presenting to the emergency department with 1 day of chest pain.  Patient reports that she has been under significant stress recently that she attributes to a previously active lifestyle but now with difficulty walking due to Charcot foot.  She then developed pain in the central chest while at rest yesterday morning and has had constant pain since then that she describes as a tightness, moderate in intensity, some radiation towards the left, associated with nausea without vomiting, and also associated with shortness of breath.  She reports that her chronic bilateral lower extremity swelling is not as bad as usual recently.  She reports a mild nonproductive cough that persists after a recent respiratory illness.  She is not sure of most of the medications that she is taking.  She feels that she needs a medication for anxiety, notes that she occasionally takes Valium when she has difficulty urinating but it does not help her anxiety.  ED Course: Upon arrival to the ED, patient is found to be afebrile, saturating mid 90s on 2 L/min supplemental oxygen, tachycardic in the 110s, and with stable blood pressure.  EKG features sinus tachycardia with rate 116.  Chest x-Edmunds notable for right basilar atelectasis.  Chemistry panel features a glucose of 417 and creatinine 1.75.  CBC notable for leukocytosis to 12,300.  High-sensitivity troponin is normal x2.  COVID-19 screening test not yet resulted.  Review of Systems:  All other systems reviewed and apart from HPI, are negative.  Past Medical History:  Diagnosis Date  . Anemia     have received iron infusions  . Anxiety   . Arthritis     osteoarthritis  . Breast cancer ([place])   . Breast cancer in female [facility])    Bilateral  . CAD S/P percutaneous coronary angioplasty 2007; March 2011   [facility] [facility] 3.0 mm 20 mm postdilated 3.6 mm in early mid LAD; status post ISR Cutting Balloon PTCA and March 11 along with PCI of distal mid lesion with a 3.0 mm 12 mm MultiLink vision BMS; the proximal stent causes jailing of SP1 and SP2 with ostial 70-80% lesions  . Cancer (HCC)    Melanoma, Squamous cell Carcinoma  . Charcot's joint of foot, right   . Charcot's joint of left foot   . Chronic diastolic CHF (congestive heart failure), NYHA class 2 ([place]) 02/28/2018  . Chronic kidney disease    stage 2   . Coronary artery disease   . Dementia ([place])    mild  . Diabetes mellitus type 2 with neurological manifestations ([place])   . Diabetes mellitus without complication ([place])   . Dyslipidemia, goal LDL below 70   . Foot pain, bilateral   . Full dentures   . Genetic testing 12/03/2016   Germline genetic testing was performed through [employer] Common Hereditary Cancers Panel + [facility]'s Melanoma Panel. This custom panel includes analysis of the following 51 genes: APC, ATM, AXIN2, BAP1, BARD1, BMPR1A, BRCA1, BRCA2, BRIP1, CDH1, CDK4, CDKN2A, CHEK2, CTNNA1, DICER1, EPCAM, GREM1, HOXB13, KIT, MEN1, MITF, MLH1, MSH2, MSH3, MSH6, MUTYH, NBN, NF1, NTHL1, PALB2, PDGFRA, PMS2, POLD1, POL  .  GERD (gastroesophageal reflux disease)   . Gout   . Headache(784.0)   . History of hematuria    Followed by Dr. Gaynelle Arabian  . Hypertension, essential, benign   . Migraine   . Mild cognitive impairment   . Peripheral neuropathy   . Personal history of radiation therapy   . Post herpetic neuralgia   . Restless leg syndrome   . Stroke ([place])   . TIA (transient ischemic attack)    multiple in the past  . TIA (transient ischemic attack)   . Wears glasses     Past Surgical History:  Procedure Laterality  Date  . ABDOMINAL HYSTERECTOMY    . ANKLE FUSION Right 02/22/2018   Procedure: RIGHT SUBTALAR AND TALONAVICULAR FUSION;  Surgeon: Newt Minion, MD;  Location: [place];  Service: Orthopedics;  Laterality: Right;  . APPENDECTOMY    . BIOPSY  02/19/2020   Procedure: BIOPSY;  Surgeon: Otis Brace, MD;  Location: [place];  Service: Gastroenterology;;  . BLADDER SUSPENSION    . BREAST LUMPECTOMY Bilateral 2018  . BREAST LUMPECTOMY WITH RADIOACTIVE SEED AND SENTINEL LYMPH NODE BIOPSY Bilateral 12/14/2016   Procedure: BILATERAL BREAST LUMPECTOMY WITH BILATERAL  RADIOACTIVE SEED AND BILATERAL AXILLARY  SENTINEL LYMPH NODE BIOPSY ERAS PATHWAY;  Surgeon: Alphonsa Overall, MD;  Location: [place];  Service: General;  Laterality: Bilateral;  PECTORAL BLOCK  . CARDIAC CATHETERIZATION  02/24/2006   85% stenosis in the proximal portion of LAD-arrangements made for PCI on 02/25/2006  . CARDIAC CATHETERIZATION  02/25/2006   80% LAD lesion stented with a 3x100mm Liberte stent resulting in reduction of 80% lesion to 0% residual  . CARDIAC CATHETERIZATION  04/26/2006   Medical management  . CARDIAC CATHETERIZATION  06/24/2009   60-70% re-stenosis in the proximal LAD. A 3.25x15 cutting balloon, 3 inflations - 14atm-38sec, 13atm-39sec, and 12atm-40sec reduced to less than 10%. 60% stenosis of the mid/distal LAD stented with a 3x20mm Multilink stent.  Marland Kitchen CARDIAC CATHETERIZATION  07/09/2009   Medical management  . CARDIAC CATHETERIZATION    . CAROTID DOPPLER  06/24/2009   40-59% R ICA stenosis. No significant ICA stenosis noted  . CHOLECYSTECTOMY    . CORONARY ANGIOPLASTY    . DILATION AND CURETTAGE OF UTERUS    . ESOPHAGOGASTRODUODENOSCOPY N/A 02/26/2020   Procedure: ESOPHAGOGASTRODUODENOSCOPY (EGD);  Surgeon: Clarene Essex, MD;  Location: [place];  Service: Endoscopy;  Laterality: N/A;  . ESOPHAGOGASTRODUODENOSCOPY (EGD) WITH PROPOFOL N/A 02/19/2020   Procedure: ESOPHAGOGASTRODUODENOSCOPY (EGD) WITH  PROPOFOL;  Surgeon: Otis Brace, MD;  Location: [facility];  Service: Gastroenterology;  Laterality: N/A;  . ESOPHAGOGASTRODUODENOSCOPY (EGD) WITH PROPOFOL Left 02/23/2020   Procedure: ESOPHAGOGASTRODUODENOSCOPY (EGD) WITH PROPOFOL;  Surgeon: Arta Silence, MD;  Location: [facility]COPY;  Service: Endoscopy;  Laterality: Left;  . HIP ARTHROPLASTY Left 02/10/2020   Procedure: ARTHROPLASTY BIPOLAR HIP (HEMIARTHROPLASTY) POSTERIOR LATERAL;  Surgeon: [name], MD;  Location: [place];  Service: Orthopedics;  Laterality: Left;  . JOINT REPLACEMENT Left   . LESION REMOVAL Right 03/11/2015   Procedure:  EXCISION OF RIGHT PRE TIBIAL LESION;  Surgeon: Judeth Horn, MD;  Location: [place];  Service: General;  Laterality: Right;  . MASS EXCISION Left 06/10/2014   Procedure: EXCISION LEFT LOWER LEG LESION;  Surgeon: Doreen Salvage, MD;  Location: [place];  Service: General;  Laterality: Left;  Marland Kitchen MASS EXCISION Left 09/05/2015   Procedure: EXCISION OF LEFT FOREARM  MASS;  Surgeon: Judeth Horn, MD;  Location: [place];  Service: General;  Laterality: Left;  . NM MYOVIEW LTD  10/2011   No ischemia or infarction.  Normal EF.  LOW RISK. (Short bursts of 5 beats NSVT during recovery otherwise normal)  . POLYPECTOMY  02/19/2020   Procedure: POLYPECTOMY;  Surgeon: Otis Brace, MD;  Location: [place] [facility];  Service: Gastroenterology;;  . SHOULDER ARTHROSCOPY  10/15  . SHOULDER ARTHROSCOPY  1995   left  . TEE WITHOUT CARDIOVERSION N/A 08/03/2017   Procedure: TRANSESOPHAGEAL ECHOCARDIOGRAM (TEE);  Surgeon: Skeet Latch, MD;  Location: [place];  Service: Cardiovascular;; EF 60-65% with no wall motion normalities.  No atrial thrombus noted.  Lightly findings consistent with a lipomatous hypertrophy of the atrial septum.   . TENDON REPAIR Left 05/12/2016   Procedure: Left Anterior Tibial Tendon Reconstruction;  Surgeon: Newt Minion, MD;  Location:  [place];  Service: Orthopedics;  Laterality: Left;  . TOTAL KNEE ARTHROPLASTY  2005   left  . TRANSESOPHAGEAL ECHOCARDIOGRAM  08/03/2016   : EF 60-65% no wall motion normalities.  No atrial thrombus.  Lipomatous hypertrophy of the atrial septum.  No mass noted.  . TRANSESOPHAGEAL ECHOCARDIOGRAM  07/2017   EF 60 to 65%.  No R WMA.  No atrial thrombus noted.  Lipomatous IAS.  Marland Kitchen TRANSTHORACIC ECHOCARDIOGRAM  02/25/2017   :EF 65-70%.  GR 1 DD.  No or WMA.  Moderate aortic calcification/sclerosis but no stenosis.  . TRANSTHORACIC ECHOCARDIOGRAM  07/2017   Vigorous LV function.  EF 65-70%.  Mild diastolic dysfunction with no RWMA. GR 1 DD.  Mild aortic sclerosis/no stenosis.  Questionable right atrial mass discounted with TEE)   . WRIST ARTHROPLASTY  2010   cancer lt wrist    Social History:   reports that she has never smoked. She has never used smokeless tobacco. She reports that she does not drink alcohol and does not use drugs.  Allergies  Allergen Reactions  . Nsaids Nausea And Vomiting and Palpitations  . Reglan [Metoclopramide] Other (See Comments)    Seizures   . Tramadol Other (See Comments)    Seizures   . Ultram [Tramadol] Other (See Comments)    Pt has seizure activity with this medication  . Lipitor [Atorvastatin] Other (See Comments)    Bone and muscle pain  . Lipitor [Atorvastatin] Swelling and Other (See Comments)    Pain   . Lyrica [Pregabalin] Other (See Comments)    Weight gain, extremity swelling  . Lyrica [Pregabalin] Swelling  . Reglan [Metoclopramide] Other (See Comments)    Tardive dyskinesia; paradoxical reaction not relieved by benadryl  . Nsaids Swelling    SWELLING REACTION UNSPECIFIED   . Urecholine [Bethanechol]     Palpitations and nausea    Family History  Problem Relation Age of Onset  . Diabetes Mother   . Kidney disease Mother   . Diabetes Sister   . Diabetes Brother   . Hypertension Mother   . Hypertension Father   . Emphysema Father   .  Heart attack Father   . Heart disease Father   . Arthritis Sister   . Diabetes Sister   . Hypertension Sister   . Breast cancer Sister 25       treated with mastectomy  . Cervical cancer Sister 75  . Hypertension Brother   . Hyperlipidemia Brother   . Diabetes Brother   . Stroke Brother   . Diabetes Sister   . Hypertension Sister   . Stomach cancer Sister 38  . Hypertension Brother   .  ALS Brother        d.62s  . Breast cancer Daughter 50       triple negative treated with lumpectomy, chemo, radiation  . Heart attack Daughter 84       d.45  . COPD Daughter   . Cancer Paternal Aunt        unspecified type  . Cancer Paternal Uncle        unspecified type  . Cancer Paternal Uncle        unspecified type     Prior to Admission medications   Medication Sig Start Date End Date Taking? Authorizing Provider  BIOTIN PO Take 1 tablet by mouth daily.    [provider]  bumetanide (BUMEX) 2 MG tablet Take 2 mg by mouth daily. 2 in morning and 1 in afternoon    [provider]  Calcium Carb-Cholecalciferol 600-800 MG-UNIT TABS Take 1 tablet by mouth daily. Patient not taking: Reported on 04/04/2020    [provider]  Cholecalciferol (VITAMIN D) 50 MCG (2000 UT) tablet Take 2,000 Units by mouth daily.    [provider]  clopidogrel (PLAVIX) 75 MG tablet Take 75 mg by mouth daily.    [provider]  Coenzyme Q10 (COQ-10 PO) Take 1 tablet by mouth daily.    [provider]  COLCRYS 0.6 MG tablet TAKE 1 TABLET DAILY AS NEEDED FOR GOUT FLARE. Patient not taking: Reported on 04/04/2020 12/27/18   Newt Minion, MD  Cranberry 500 MG TABS Take 500 mg by mouth every evening.    [provider]  CRANBERRY-CALCIUM PO Take 1 tablet by mouth daily. Patient not taking: Reported on 04/04/2020    [provider]  diazepam (VALIUM) 5 MG tablet Take 5 mg by mouth every 12 (twelve) hours as needed for anxiety.    [provider]  exemestane (AROMASIN) 25 MG tablet TAKE 1 TABLET DAILY AFTER BREAKFAST 05/20/20   Truitt Merle, MD  febuxostat (ULORIC) 40 MG tablet Take 40 mg by mouth daily. 12/31/19   [provider]  gabapentin (NEURONTIN) 300 MG capsule Take 300 mg by mouth 2 (two) times daily.  12/18/19   [provider]  JANUVIA 25 MG tablet Take 25 mg by mouth daily. 12/26/19   [provider]  Javier Docker Oil 500 MG CAPS Take 500 mg by mouth daily.    [provider]  Methylfol-Methylcob-Acetylcyst (CEREFOLIN NAC) 6-2-600 MG TABS TAKE 1 TABLET BY MOUTH EVERY DAY 06/07/19   Garvin Fila, MD  metolazone (ZAROXOLYN) 2.5 MG tablet Take 2.5 mg by mouth daily. 30 minutes before Bumetanide Mon. & Thurs.    [provider]  metoprolol succinate (TOPROL-XL) 25 MG 24 hr tablet Take 25 mg by mouth at bedtime.  12/27/19   [provider]  mirtazapine (REMERON) 15 MG tablet Take 15 mg by mouth at bedtime. Taking 1 1/2 tablet at bedtime    [provider]  Multiple Vitamin (MULTIVITAMIN WITH MINERALS) TABS tablet Take 1 tablet by mouth daily. 03/03/20   Terrilee Croak, MD  nitroGLYCERIN (NITROLINGUAL) 0.4 MG/SPRAY spray Place 1 spray under the tongue every 5 (five) minutes x 3 doses as needed for chest pain.    [provider]  pantoprazole (PROTONIX) 40 MG tablet Take 1 tablet (40 mg total) by mouth 2 (two) times daily before a meal. 03/02/20   Dahal, Marlowe Aschoff, MD  potassium chloride SA (KLOR-CON) 20 MEQ tablet Take 20 mEq by mouth 2 (two) times  daily.    [provider]  pramipexole (MIRAPEX) 0.25 MG tablet TAKE 1 TO 2 TABLETS AS INSTRUCTED, 1 TABLET AT 4:00 P.M. AND 2 TABLETS AT BEDTIME 09/27/19   Frann Rider, NP  RESVERATROL-GRAPE PO Take 500 mg by mouth.    [provider]  rosuvastatin (CRESTOR) 20 MG tablet Take 20 mg by mouth daily.    [provider]    Physical Exam: Vitals:   07/18/20 2245 07/18/20 2315 07/18/20 2330  07/18/20 2345  BP: (!) 152/89 (!) 144/82 (!) 125/105 134/78  Pulse: 96 95 94 94  Resp: (!) _0 Temp:      SpO2: 96% 94% 95% 95%  Weight:      Height:        Constitutional: NAD, anxious, tearful, no diaphoresis or pallor  Eyes: PERTLA, lids and conjunctivae normal ENMT: Mucous membranes are moist. Posterior pharynx clear of any exudate or lesions.   Neck: supple, no masses  Respiratory: no wheezing, no crackles. No accessory muscle use.  Cardiovascular: S1 & S2 heard, regular rate and rhythm. Leg swelling bilaterally.  Abdomen: No distension, no tenderness, soft. Bowel sounds active.  Musculoskeletal: no clubbing / cyanosis. No joint deformity upper and lower extremities.   Skin: no significant rashes, lesions, ulcers. Warm, dry, well-perfused. Neurologic: CN 2-12 grossly intact. Sensation intact. Moving all extremities.  Psychiatric: Alert and oriented to person, place, and situation. Tearful, anxious, cooperative.    Labs and Imaging on Admission: I have personally reviewed following labs and imaging studies  CBC: Recent Labs  Lab 07/18/20 2030  WBC 12.3*  NEUTROABS 10.3*  HGB 14.0  HCT 43.4  MCV 85.6  PLT 806   Basic Metabolic Panel: Recent Labs  Lab 07/18/20 2030  NA 129*  K 4.5  CL 91*  CO2 24  GLUCOSE 417*  BUN 20  CREATININE 1.75*  CALCIUM 9.5   GFR: Estimated Creatinine Clearance: 29.7 mL/min (A) (by C-G formula based on SCr of 1.75 mg/dL (H)). Liver Function Tests: Recent Labs  Lab 07/18/20 2030  AST 33  ALT 22  ALKPHOS 110  BILITOT 0.5  PROT 7.0  ALBUMIN 3.2*   No results for input(s): LIPASE, AMYLASE in the last 168 hours. No results for input(s): AMMONIA in the last 168 hours. Coagulation Profile: No results for input(s): INR, PROTIME in the last 168 hours. Cardiac Enzymes: No results for input(s): CKTOTAL, CKMB, CKMBINDEX, TROPONINI in the last 168 hours. BNP (last 3 results) No results for input(s): PROBNP in the last 8760  hours. HbA1C: No results for input(s): HGBA1C in the last 72 hours. CBG: No results for input(s): GLUCAP in the last 168 hours. Lipid Profile: No results for input(s): CHOL, HDL, LDLCALC, TRIG, CHOLHDL, LDLDIRECT in the last 72 hours. Thyroid Function Tests: No results for input(s): TSH, T4TOTAL, FREET4, T3FREE, THYROIDAB in the last 72 hours. Anemia Panel: No results for input(s): VITAMINB12, FOLATE, FERRITIN, TIBC, IRON, RETICCTPCT in the last 72 hours. Urine analysis:    Component Value Date/Time   COLORURINE YELLOW 02/23/2020 1722   APPEARANCEUR CLOUDY (A) 02/23/2020 1722   LABSPEC 1.008 02/23/2020 1722   PHURINE 6.0 02/23/2020 1722   GLUCOSEU NEGATIVE 02/23/2020 1722   HGBUR LARGE (A) 02/23/2020 1722   BILIRUBINUR NEGATIVE 02/23/2020 1722   KETONESUR NEGATIVE 02/23/2020 1722   PROTEINUR 30 (A) 02/23/2020 1722   UROBILINOGEN 0.2 10/05/2013 0059   NITRITE NEGATIVE 02/23/2020 1722   LEUKOCYTESUR LARGE (A) 02/23/2020 1722   Sepsis Labs: _1 (procalcitonin:4,lacticidven:4) )  No results found for this or any previous visit (from the past 240 hour(s)).   Radiological Exams on Admission: DG Chest 1 View  Result Date: 07/18/2020 CLINICAL DATA:  Chest pain and shortness of breath EXAM: CHEST  1 VIEW COMPARISON:  02/18/2020 FINDINGS: There is right basilar atelectasis. Calcific aortic atherosclerosis. No focal airspace consolidation or pulmonary edema. Normal pleural spaces. IMPRESSION: Right basilar atelectasis. Electronically Signed   By: Ulyses Jarred M.D.   On: 07/18/2020 21:06    EKG: Independently reviewed. Sinus tachycardia, rate 116.   Assessment/Plan   1. Chest pain; CAD   - Presents with one day of constant chest pain with nausea and mild SOB and is found to be tachycardic with new supplemental O2 requirement and normal HS troponin x2  - She has hx of CAD with stents placed in 2007  - She has hx of PUD and recent reflux sxs, no longer taking PPI regularly  -  Check d-dimer, repeat EKG once rate slowed, trial GI cocktail, trial NTG    2. Acute hypoxic respiratory failure  - Requiring 2 Lpm supplemental O2 to maintain saturations in low to mid 90s in ED  - No apparent respiratory distress, right base atelectasis on CXR  - Chest pain is not pleuritic and mild cough non-productive  - Check d-dimer, continue supplemental O2 as needed    3. Uncontrolled type II DM  - Serum glucose 417 on admission without DKA  - Patient and her daughter unsure what medications she is taking at home for diabetes  - A1c was 8.8% in November 2021  - Check CBGs and use SSI for now    4. History of CVA   - Continue Plavix and statin    ADDENDUM: patient not sure if she resumed Plavix after GI bleed in December. Has not been filled since then per pharmacy, will hold for now.    5. CKD IV  - SCr is 1.75 on admission; was 1.2 in December 2021 though patient's daughter reports pt has CKD IV   - Renally-dose medications, monitor    6. Breast cancer  - Continue Aromasin   7. Chronic diastolic CHF  - Appears compensated, EF was 60-65% in November 2021  - Patient reports recent change in diuretics but asks that we call her husband tomorrow to clarify what she is taking, plan to [facility] for now   8. UTI  - Patient complained of dysuria, UA consistent with infection  - Culture urine, start Rocephin    DVT prophylaxis: sq heparin  Code Status: Full  Level of Care: Level of care: Telemetry Cardiac Family Communication: Daughter updated at bedside Disposition Plan:  Patient is from: home  Anticipated d/c is to: Home  Anticipated d/c date is: 07/20/20 Patient currently: Pending lipase level, d-dimer, trial of GI cocktail and/or nitroglycerin  Consults called: None  Admission status: Observation     Vianne Bulls, MD [facility]  07/19/2020, 12:57 AM

## 2020-07-19 NOTE — ED Notes (Signed)
Report called and given to Chanel, RN receiving pt in room 6E4.

## 2020-07-20 LAB — BASIC METABOLIC PANEL
Anion gap: 12 (ref 5–15)
BUN: 18 mg/dL (ref 8–23)
CO2: 23 mmol/L (ref 22–32)
Calcium: 9.6 mg/dL (ref 8.9–10.3)
Chloride: 99 mmol/L (ref 98–111)
Creatinine, Ser: 1.38 mg/dL — ABNORMAL HIGH (ref 0.44–1.00)
GFR, Estimated: 38 mL/min — ABNORMAL LOW (ref >60)
Glucose, Bld: 310 mg/dL — ABNORMAL HIGH (ref 70–99)
Potassium: 4.2 mmol/L (ref 3.5–5.1)
Sodium: 134 mmol/L — ABNORMAL LOW (ref 135–145)

## 2020-07-20 LAB — GLUCOSE, CAPILLARY
Glucose-Capillary: 191 mg/dL — ABNORMAL HIGH (ref 70–99)
Glucose-Capillary: 236 mg/dL — ABNORMAL HIGH (ref 70–99)
Glucose-Capillary: 257 mg/dL — ABNORMAL HIGH (ref 70–99)
Glucose-Capillary: 287 mg/dL — ABNORMAL HIGH (ref 70–99)
Glucose-Capillary: 295 mg/dL — ABNORMAL HIGH (ref 70–99)
Glucose-Capillary: 299 mg/dL — ABNORMAL HIGH (ref 70–99)
Glucose-Capillary: 351 mg/dL — ABNORMAL HIGH (ref 70–99)

## 2020-07-20 MED ORDER — CEPHALEXIN 500 MG PO CAPS
500.0000 mg | ORAL_CAPSULE | Freq: Three times a day (TID) | ORAL | Status: DC
  Administered 2020-07-20 – 2020-07-21 (×3): 500 mg via ORAL
  Filled 2020-07-20 (×3): qty 1

## 2020-07-20 NOTE — Progress Notes (Signed)
PROGRESS NOTE  [name] ZGY:[id number] DOB: 05-13-1940 DOA: 07/18/2020 PCP: [name], DO  HPI/Recap of past 24 hours: [name] is a 80 y.o. female with medical history significant for CAD with stents in 2007, CVA, chronic diastolic CHF, breast cancer, type 2 diabetes mellitus, peptic ulcer disease, and anxiety, chronic hypoxemia on 2 L as needed, now presenting to the emergency department with 1 day of chest pain.  Patient reports that she has been under significant stress recently that she attributes to a previously active lifestyle but now with difficulty walking due to Charcot foot.  She then developed pain in the central chest while at rest yesterday (07/18/20) morning and has had constant pain since then that she describes as a tightness, moderate in intensity, some radiation towards the left, associated with nausea without vomiting, and also associated with shortness of breath.  She reports that her chronic bilateral lower extremity swelling is not as bad as usual.  She reports a mild nonproductive cough that persists after a recent respiratory illness.  She is not sure of most of the medications that she is taking.    Upon arrival to the ED, patient is found to be afebrile, saturating mid 90s on 2 L/min supplemental oxygen, tachycardic in the 110s, and with stable blood pressure.  EKG features sinus tachycardia with rate 116.  Chest x-[place] notable for right basilar atelectasis.  Chemistry panel features a glucose of 417 and creatinine 1.75.  CBC notable for leukocytosis to 12,300.  High-sensitivity troponin is normal x2.  COVID-19 screening test negative.  07/20/20: Patient was seen and examined at bedside.  Reports having a headache.  History of intermittent headaches often improved with Tylenol or ibuprofen.  Lower abdominal pressure and nausea improved.  On IV antibiotics for symptomatic UTI.  Assessment/Plan: Principal Problem:   Chest pain Active Problems:   CAD S/P  percutaneous coronary angioplasty   Anxiety   Mild cognitive impairment   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 (HCC)   Chronic diastolic CHF (congestive heart failure), NYHA class 2 (HCC)   CKD (chronic kidney disease) stage 4, GFR 15-29 ml/min (HCC)   Acute respiratory failure with hypoxia (HCC)   History of CVA (cerebrovascular accident)   PUD (peptic ulcer disease)  Chest pain; CAD   - Presents with one day of constant chest pain with nausea and mild SOB and is found to be tachycardic with normal HS troponin x2  - She has hx of CAD with stents placed in 2007  - She has hx of PUD and recent reflux sxs, no longer taking PPI regularly  D-dimer was elevated, VQ scan is pending.  Elevated D-dimer, rule out pulmonary embolism VQ scan is ordered and pending. Could not obtain CT angio chest due to CKD. Follow results.  AKI on CKD 3B Baseline creatinine appears to be 1.3 with GFR of 38. Presented with creatinine of 1.75 Avoid nephrotoxic agents Monitor urine output Repeat renal panel in the morning.  Symptomatic pyuria, presumptive UTI. - Patient complained of dysuria, UA consistent with infection  Continue Rocephin Urine culture still pending.  Acute on chronic hypoxic respiratory failure  Self reports at baseline she is on 2 L as needed for dyspnea On presentation she required 2 L to maintain a saturation greater than 90%. D-dimer was elevated, VQ scan is pending. Maintain O2 saturation greater than 92% Wean off oxygen supplementation as tolerated.  Uncontrolled type II DM  - Serum glucose 417 on admission without  DKA  - Hold off home oral hypoglycemics. - A1c was 8.8% in November 2021   History of CVA   - Continue Plavix and statin    Breast cancer  - Continue Aromasin   Chronic diastolic CHF  - Appears compensated, EF was 60-65% in November 2021  - Patient reports recent change in diuretics but asks that we call her husband tomorrow to  clarify what she is taking, plan to [facility] for now  Strict I's and O's and daily weight.  Ambulatory dysfunction/Charcot foot PT OT to assess Fall precautions. Ambulates with a walker at baseline.   DVT prophylaxis: sq heparin  3 times daily Code Status: Full  Level of Care: Level of care: Telemetry Cardiac Family Communication: Daughter updated at bedside Disposition Plan:  Patient is from: home  Anticipated d/c is to: Home  Anticipated d/c date is: 07/21/20 Patient currently:  Pending VQ scan results.    Consults called: None    Status is: Inpatient   Dispo:  Patient From: Home  Planned Disposition: Home, possibly on 07/21/2020.  Medically stable for discharge: No          Objective: Vitals:   07/20/20 0419 07/20/20 0648 07/20/20 0851 07/20/20 1148  BP:  (!) 141/81 134/74 (!) 141/89  Pulse:  82 79 75  Resp:  20 20 16   Temp: 97.8 F (36.6 C) 97.7 F (36.5 C) 98.1 F (36.7 C) 97.9 F (36.6 C)  TempSrc: Oral Oral Oral Oral  SpO2:  94% 94% 95%  Weight:  79.3 kg    Height:        Intake/Output Summary (Last 24 hours) at 07/20/2020 1400 Last data filed at 07/20/2020 0900 Gross per 24 hour  Intake 450.02 ml  Output 800 ml  Net -349.98 ml   Filed Weights   07/18/20 2018 07/19/20 0203 07/20/20 0648  Weight: 90.8 kg 79.4 kg 79.3 kg    Exam:  . General: 80 y.o. year-old female pleasant in no acute distress.  She is alert oriented x3.   . Cardiovascular: Regular rate and rhythm no rubs or gallops.   Marland Kitchen Respiratory: Clear to auscultation no wheezes or rales.   . Abdomen: Soft nontender normal bowel sounds present.   . Musculoskeletal: Bilateral lower extremity edema. . Skin: No ulcerative lesions noted.   Marland Kitchen Psychiatry: Mood is appropriate for condition setting.   Data Reviewed: CBC: Recent Labs  Lab 07/18/20 2030 07/19/20 0237  WBC 12.3* 9.2  NEUTROABS 10.3*  --   HGB 14.0 14.0  HCT 43.4 43.3  MCV 85.6 84.7  PLT 251 962   Basic Metabolic  Panel: Recent Labs  Lab 07/18/20 2030 07/19/20 0237 07/20/20 0934  NA 129* 133* 134*  K 4.5 4.3 4.2  CL 91* 92* 99  CO2 24 29 23   GLUCOSE 417* 353* 310*  BUN 20 20 18   CREATININE 1.75* 1.69* 1.38*  CALCIUM 9.5 9.6 9.6  MG  --  2.2  --    GFR: Estimated Creatinine Clearance: 34.7 mL/min (A) (by C-G formula based on SCr of 1.38 mg/dL (H)). Liver Function Tests: Recent Labs  Lab 07/18/20 2030  AST 33  ALT 22  ALKPHOS 110  BILITOT 0.5  PROT 7.0  ALBUMIN 3.2*   Recent Labs  Lab 07/19/20 0237  LIPASE 44   No results for input(s): AMMONIA in the last 168 hours. Coagulation Profile: No results for input(s): INR, PROTIME in the last 168 hours. Cardiac Enzymes: No results for input(s): CKTOTAL, CKMB, CKMBINDEX,  TROPONINI in the last 168 hours. BNP (last 3 results) No results for input(s): PROBNP in the last 8760 hours. HbA1C: Recent Labs    07/19/20 0237  HGBA1C 12.8*   CBG: Recent Labs  Lab 07/19/20 2143 07/20/20 0014 07/20/20 0410 07/20/20 0813 07/20/20 1144  GLUCAP 216* 295* 236* 257* 351*   Lipid Profile: No results for input(s): CHOL, HDL, LDLCALC, TRIG, CHOLHDL, LDLDIRECT in the last 72 hours. Thyroid Function Tests: No results for input(s): TSH, T4TOTAL, FREET4, T3FREE, THYROIDAB in the last 72 hours. Anemia Panel: No results for input(s): VITAMINB12, FOLATE, FERRITIN, TIBC, IRON, RETICCTPCT in the last 72 hours. Urine analysis:    Component Value Date/Time   COLORURINE AMBER (A) 07/19/2020 0307   APPEARANCEUR CLOUDY (A) 07/19/2020 0307   LABSPEC 1.011 07/19/2020 0307   PHURINE 5.0 07/19/2020 0307   GLUCOSEU >=500 (A) 07/19/2020 0307   HGBUR NEGATIVE 07/19/2020 0307   BILIRUBINUR NEGATIVE 07/19/2020 0307   KETONESUR NEGATIVE 07/19/2020 0307   PROTEINUR 30 (A) 07/19/2020 0307   UROBILINOGEN 0.2 10/05/2013 0059   NITRITE POSITIVE (A) 07/19/2020 0307   LEUKOCYTESUR LARGE (A) 07/19/2020 0307   Sepsis  Labs: @LABRCNTIP (procalcitonin:4,lacticidven:4)  ) Recent Results (from the past 240 hour(s))  SARS CORONAVIRUS 2 (TAT 6-24 HRS) Nasopharyngeal Nasopharyngeal Swab     Status: None   Collection Time: 07/19/20 12:37 AM   Specimen: Nasopharyngeal Swab  Result Value Ref Range Status   SARS Coronavirus 2 NEGATIVE NEGATIVE Final    Comment: (NOTE) SARS-CoV-2 target nucleic acids are NOT DETECTED.  The SARS-CoV-2 RNA is generally detectable in upper and lower respiratory specimens during the acute phase of infection. Negative results do not preclude SARS-CoV-2 infection, do not rule out co-infections with other pathogens, and should not be used as the sole basis for treatment or other patient management decisions. Negative results must be combined with clinical observations, patient history, and epidemiological information. The expected result is Negative.  Fact Sheet for Patients: SugarRoll.be  Fact Sheet for Healthcare Providers: https://www.woods-mathews.com/  This test is not yet approved or cleared by the [place] FDA and  has been authorized for detection and/or diagnosis of SARS-CoV-2 by FDA under an Emergency Use Authorization (EUA). This EUA will remain  in effect (meaning this test can be used) for the duration of the COVID-19 declaration under Se ction 564(b)(1) of the Act, 21 U.S.C. section 360bbb-3(b)(1), unless the authorization is terminated or revoked sooner.  Performed at [facility], [place] [address] [place], [place] [address]       Studies: No results found.  Scheduled Meds: . exemestane  25 mg Oral Daily  . heparin  5,000 Units Subcutaneous Q8H  . insulin aspart  0-9 Units Subcutaneous Q4H  . metoprolol succinate  25 mg Oral QHS  . mirtazapine  15 mg Oral QHS  . nystatin   Topical BID  . pantoprazole  40 mg Oral BID AC  . rosuvastatin  10 mg Oral Daily    Continuous Infusions: . cefTRIAXone  (ROCEPHIN)  IV 1 g (07/20/20 0939)     LOS: 1 day     Kayleen Memos, MD [facility] Pager [phone]  If 7PM-7AM, please contact night-coverage [email or web address] Password [email or web address] 07/20/2020, 2:00 PM

## 2020-07-21 ENCOUNTER — Inpatient Hospital Stay (HOSPITAL_COMMUNITY): Payer: Medicare Other

## 2020-07-21 ENCOUNTER — Other Ambulatory Visit (HOSPITAL_COMMUNITY): Payer: Self-pay

## 2020-07-21 ENCOUNTER — Other Ambulatory Visit: Payer: Self-pay | Admitting: *Deleted

## 2020-07-21 LAB — BASIC METABOLIC PANEL
Anion gap: 8 (ref 5–15)
BUN: 16 mg/dL (ref 8–23)
CO2: 30 mmol/L (ref 22–32)
Calcium: 9.8 mg/dL (ref 8.9–10.3)
Chloride: 99 mmol/L (ref 98–111)
Creatinine, Ser: 1.35 mg/dL — ABNORMAL HIGH (ref 0.44–1.00)
GFR, Estimated: 39 mL/min — ABNORMAL LOW (ref >60)
Glucose, Bld: 228 mg/dL — ABNORMAL HIGH (ref 70–99)
Potassium: 4.1 mmol/L (ref 3.5–5.1)
Sodium: 137 mmol/L (ref 135–145)

## 2020-07-21 LAB — D-DIMER, QUANTITATIVE: D-Dimer, Quant: 1.24 ug/mL-FEU — ABNORMAL HIGH (ref 0.00–0.50)

## 2020-07-21 LAB — CBC
HCT: 42.1 % (ref 36.0–46.0)
Hemoglobin: 13.3 g/dL (ref 12.0–15.0)
MCH: 27.4 pg (ref 26.0–34.0)
MCHC: 31.6 g/dL (ref 30.0–36.0)
MCV: 86.8 fL (ref 80.0–100.0)
Platelets: 207 10*3/uL (ref 150–400)
RBC: 4.85 MIL/uL (ref 3.87–5.11)
RDW: 16.4 % — ABNORMAL HIGH (ref 11.5–15.5)
WBC: 6.9 10*3/uL (ref 4.0–10.5)
nRBC: 0 % (ref 0.0–0.2)

## 2020-07-21 LAB — GLUCOSE, CAPILLARY
Glucose-Capillary: 251 mg/dL — ABNORMAL HIGH (ref 70–99)
Glucose-Capillary: 234 mg/dL — ABNORMAL HIGH (ref 70–99)
Glucose-Capillary: 181 mg/dL — ABNORMAL HIGH (ref 70–99)

## 2020-07-21 MED ORDER — CEPHALEXIN 500 MG PO CAPS
500.0000 mg | ORAL_CAPSULE | Freq: Three times a day (TID) | ORAL | 0 refills | Status: AC
  Filled 2020-07-21: qty 12, 4d supply, fill #0

## 2020-07-21 MED ORDER — TECHNETIUM TO 99M ALBUMIN AGGREGATED
3.9000 | Freq: Once | INTRAVENOUS | Status: AC | PRN
  Administered 2020-07-21: 3.9 via INTRAVENOUS

## 2020-07-21 MED ORDER — FREESTYLE LITE TEST VI STRP
ORAL_STRIP | 3 refills | Status: AC
  Filled 2020-07-21: qty 100, 25d supply, fill #0

## 2020-07-21 MED ORDER — INSULIN GLARGINE 100 UNIT/ML SOLOSTAR PEN
5.0000 [IU] | PEN_INJECTOR | Freq: Every day | SUBCUTANEOUS | 0 refills | Status: AC
  Filled 2020-07-21: qty 3, 28d supply, fill #0

## 2020-07-21 MED ORDER — SACCHAROMYCES BOULARDII 250 MG PO CAPS
250.0000 mg | ORAL_CAPSULE | Freq: Two times a day (BID) | ORAL | Status: DC
  Administered 2020-07-21: 250 mg via ORAL
  Filled 2020-07-21 (×2): qty 1

## 2020-07-21 MED ORDER — INSULIN PEN NEEDLE 32G X 4 MM MISC
5.0000 [IU] | Freq: Two times a day (BID) | 0 refills | Status: AC
  Filled 2020-07-21: qty 100, 30d supply, fill #0

## 2020-07-21 MED ORDER — FREESTYLE LANCETS MISC
3 refills | Status: AC
  Filled 2020-07-21: qty 100, 25d supply, fill #0

## 2020-07-21 MED ORDER — SACCHAROMYCES BOULARDII 250 MG PO CAPS
250.0000 mg | ORAL_CAPSULE | Freq: Two times a day (BID) | ORAL | 0 refills | Status: AC
  Filled 2020-07-21: qty 14, 7d supply, fill #0

## 2020-07-21 MED ORDER — BLOOD GLUCOSE MONITOR KIT
PACK | 0 refills | Status: DC
  Filled 2020-07-21: qty 1, fill #0

## 2020-07-21 MED ORDER — FREESTYLE LITE W/DEVICE KIT
PACK | 0 refills | Status: AC
  Filled 2020-07-21: qty 1, 14d supply, fill #0

## 2020-07-21 MED ORDER — INSULIN STARTER KIT- PEN NEEDLES (ENGLISH)
1.0000 | Freq: Once | 0 refills | Status: AC
  Filled 2020-07-21: qty 1, 1d supply, fill #0

## 2020-07-21 MED ORDER — INSULIN GLARGINE 100 UNIT/ML ~~LOC~~ SOLN
7.0000 [IU] | Freq: Two times a day (BID) | SUBCUTANEOUS | Status: DC
  Administered 2020-07-21: 7 [IU] via SUBCUTANEOUS
  Filled 2020-07-21 (×2): qty 0.07

## 2020-07-21 MED ORDER — PENTIPS 32G X 4 MM MISC
5 refills | Status: AC
  Filled 2020-07-21: qty 100, 25d supply, fill #0

## 2020-07-21 NOTE — Progress Notes (Signed)
Discharge instructions (including medications) discussed with and copy provided to patient/caregiver 

## 2020-07-21 NOTE — TOC Transition Note (Signed)
Transition of Care [facility]) - CM/SW Discharge Note   Patient Details  Name: [name] MRN: [id number] Date of Birth: 12/24/1940  Transition of Care [facility]) CM/SW Contact:  Zenon Mayo, RN Phone Number: 07/21/2020, 9:39 AM   Clinical Narrative:    NCM spoke with patient at bedside, she lives with spouse, he does the cooking for her and he takes her to  Her MD apst.  She has rolling walker at home.  She uses the [facility] on [address], but would like for TOC to fill meds for her before dc.  She states her spouse will transport her home.  NCM offered choice for [facility] , she states she has had HH before with [facility] and would like to have them again.  NCM made referral to [facility] with [facility] for [place]. She is able to take referral , soc will begin 24 to 48 hrs post dc   Final next level of care: [place] Barriers to Discharge: No Barriers Identified   Patient Goals and CMS Choice Patient states their goals for this hospitalization and ongoing recovery are:: return home CMS Medicare.gov Compare Post Acute Care list provided to:: Patient Choice offered to / list presented to : Patient  Discharge Placement                       Discharge Plan and Services In-house Referral: NA Discharge Planning Services: CM Consult              DME Agency: NA       HH Arranged: PT,OT [place] Agency: [place] ([facility]) Date HH Agency Contacted: 07/21/20 Time [place]: [phone] Representative spoke with at [place]: [place] ([place]) Interventions     Readmission Risk Interventions Readmission Risk Prevention Plan 07/21/2020  Transportation Screening Complete  Medication Review Press photographer) Complete  PCP or Specialist appointment within 3-5 days of discharge Complete  HRI or [place] Complete  SW Recovery Care/Counseling Consult Complete  [place] Not Applicable  Some recent data might be hidden

## 2020-07-21 NOTE — Consult Note (Signed)
WOC Nurse Consult Note: Patient receiving care in [place]. Reason for Consult: buttocks wound Wound type: MASD-ITD Pressure Injury POA: Yes/No/NA Measurement: deferred, patient to be discharged today. Area present for weeks. Patient uses a donut device at home, which I have explained why it is best not to use such devices. And, she uses some type of salve she was given in an ED at some point in the past. Wound bed: superficial, moist Drainage (amount, consistency, odor)  Periwound: Dressing procedure/placement/frequency: I have encouraged her not to use the donut at home, to continue using the salve she was given, and to change her positions on the buttocks often. She verbalized her understanding. [place] nurse will not follow at this time.  Please re-consult the [facility] team if needed.  Val Riles, RN, MSN, CWOCN, CNS-BC, pager [phone]

## 2020-07-21 NOTE — Discharge Instructions (Signed)
Nonspecific Chest Pain Chest pain can be caused by many different conditions. Some causes of chest pain can be life-threatening. These will require treatment right away. Serious causes of chest pain include:  Heart attack.  A tear in the body's main blood vessel.  Redness and swelling (inflammation) around your heart.  Blood clot in your lungs. Other causes of chest pain may not be so serious. These include:  Heartburn.  Anxiety or stress.  Damage to bones or muscles in your chest.  Lung infections. Chest pain can feel like:  Pain or discomfort in your chest.  Crushing, pressure, aching, or squeezing pain.  Burning or tingling.  Dull or sharp pain that is worse when you move, cough, or take a deep breath.  Pain or discomfort that is also felt in your back, neck, jaw, shoulder, or arm, or pain that spreads to any of these areas. It is hard to know whether your pain is caused by something that is serious or something that is not so serious. So it is important to see your doctor right away if you have chest pain. Follow these instructions at home: Medicines  Take over-the-counter and prescription medicines only as told by your doctor.  If you were prescribed an antibiotic medicine, take it as told by your doctor. Do not stop taking the antibiotic even if you start to feel better. Lifestyle  Rest as told by your doctor.  Do not use any products that contain nicotine or tobacco, such as cigarettes, e-cigarettes, and chewing tobacco. If you need help quitting, ask your doctor.  Do not drink alcohol.  Make lifestyle changes as told by your doctor. These may include: ? Getting regular exercise. Ask your doctor what activities are safe for you. ? Eating a heart-healthy diet. A diet and nutrition specialist (dietitian) can help you to learn healthy eating options. ? Staying at a healthy weight. ? Treating diabetes or high blood pressure, if needed. ? Lowering your stress.  Activities such as yoga and relaxation techniques can help.   General instructions  Pay attention to any changes in your symptoms. Tell your doctor about them or any new symptoms.  Avoid any activities that cause chest pain.  Keep all follow-up visits as told by your doctor. This is important. You may need more testing if your chest pain does not go away. Contact a doctor if:  Your chest pain does not go away.  You feel depressed.  You have a fever. Get help right away if:  Your chest pain is worse.  You have a cough that gets worse, or you cough up blood.  You have very bad (severe) pain in your belly (abdomen).  You pass out (faint).  You have either of these for no clear reason: ? Sudden chest discomfort. ? Sudden discomfort in your arms, back, neck, or jaw.  You have shortness of breath at any time.  You suddenly start to sweat, or your skin gets clammy.  You feel sick to your stomach (nauseous).  You throw up (vomit).  You suddenly feel lightheaded or dizzy.  You feel very weak or tired.  Your heart starts to beat fast, or it feels like it is skipping beats. These symptoms may be an emergency. Do not wait to see if the symptoms will go away. Get medical help right away. Call your local emergency services (911 in the [place] Do not drive yourself to the hospital. Summary  Chest pain can be caused by many different  conditions. The cause may be serious and need treatment right away. If you have chest pain, see your doctor right away.  Follow your doctor's instructions for taking medicines and making lifestyle changes.  Keep all follow-up visits as told by your doctor. This includes visits for any further testing if your chest pain does not go away.  Be sure to know the signs that show that your condition has become worse. Get help right away if you have these symptoms. This information is not intended to replace advice given to you by your health care provider.  Make sure you discuss any questions you have with your health care provider. Document Revised: 09/15/2017 Document Reviewed: 09/15/2017 Elsevier Patient Education  2021 [place]. Shortness of Breath, Adult Shortness of breath means you have trouble breathing. Shortness of breath could be a sign of a medical problem. Follow these instructions at home:  Watch for any changes in your symptoms.  Do not use any products that contain nicotine or tobacco, such as cigarettes, e-cigarettes, and chewing tobacco.  Do not smoke. Smoking can cause shortness of breath. If you need help to quit smoking, ask your doctor.  Avoid things that can make it harder to breathe, such as: ? Mold. ? Dust. ? Air pollution. ? Chemical smells. ? Things that can cause allergy symptoms (allergens), if you have allergies.  Keep your living space clean. Use products that help remove mold and dust.  Rest as needed. Slowly return to your normal activities.  Take over-the-counter and prescription medicines only as told by your doctor. This includes oxygen therapy and inhaled medicines.  Keep all follow-up visits as told by your doctor. This is important.   Contact a doctor if:  Your condition does not get better as soon as expected.  You have a hard time doing your normal activities, even after you rest.  You have new symptoms. Get help right away if:  Your shortness of breath gets worse.  You have trouble breathing when you are resting.  You feel light-headed or you pass out (faint).  You have a cough that is not helped by medicines.  You cough up blood.  You have pain with breathing.  You have pain in your chest, arms, shoulders, or belly (abdomen).  You have a fever.  You cannot walk up stairs.  You cannot exercise the way you normally do. These symptoms may represent a serious problem that is an emergency. Do not wait to see if the symptoms will go away. Get medical help right away. Call  your local emergency services (911 in the [place] Do not drive yourself to the hospital. Summary  Shortness of breath is when you have trouble breathing enough air. It can be a sign of a medical problem.  Avoid things that make it hard for you to breathe, such as smoking, pollution, mold, and dust.  Watch for any changes in your symptoms. Contact your doctor if you do not get better or you get worse. This information is not intended to replace advice given to you by your health care provider. Make sure you discuss any questions you have with your health care provider. Document Revised: 08/15/2017 Document Reviewed: 08/15/2017 Elsevier Patient Education  2021 [place].  Urinary Tract Infection, Adult A urinary tract infection (UTI) is an infection of any part of the urinary tract. The urinary tract includes:  The kidneys.  The ureters.  The bladder.  The urethra. These organs make, store, and get rid of  pee (urine) in the body. What are the causes? This infection is caused by germs (bacteria) in your genital area. These germs grow and cause swelling (inflammation) of your urinary tract. What increases the risk? The following factors may make you more likely to develop this condition:  Using a small, thin tube (catheter) to drain pee.  Not being able to control when you pee or poop (incontinence).  Being female. If you are female, these things can increase the risk: ? Using these methods to prevent pregnancy:  A medicine that kills sperm (spermicide).  A device that blocks sperm (diaphragm). ? Having low levels of a female hormone (estrogen). ? Being pregnant. You are more likely to develop this condition if:  You have genes that add to your risk.  You are sexually active.  You take antibiotic medicines.  You have trouble peeing because of: ? A prostate that is bigger than normal, if you are female. ? A blockage in the part of your body that drains pee from the  bladder. ? A kidney stone. ? A nerve condition that affects your bladder. ? Not getting enough to drink. ? Not peeing often enough.  You have other conditions, such as: ? Diabetes. ? A weak disease-fighting system (immune system). ? Sickle cell disease. ? Gout. ? Injury of the spine. What are the signs or symptoms? Symptoms of this condition include:  Needing to pee right away.  Peeing small amounts often.  Pain or burning when peeing.  Blood in the pee.  Pee that smells bad or not like normal.  Trouble peeing.  Pee that is cloudy.  Fluid coming from the vagina, if you are female.  Pain in the belly or lower back. Other symptoms include:  Vomiting.  Not feeling hungry.  Feeling mixed up (confused). This may be the first symptom in older adults.  Being tired and grouchy (irritable).  A fever.  Watery poop (diarrhea). How is this treated?  Taking antibiotic medicine.  Taking other medicines.  Drinking enough water. In some cases, you may need to see a specialist. Follow these instructions at home: Medicines  Take over-the-counter and prescription medicines only as told by your doctor.  If you were prescribed an antibiotic medicine, take it as told by your doctor. Do not stop taking it even if you start to feel better. General instructions  Make sure you: ? Pee until your bladder is empty. ? Do not hold pee for a long time. ? Empty your bladder after sex. ? Wipe from front to back after peeing or pooping if you are a female. Use each tissue one time when you wipe.  Drink enough fluid to keep your pee pale yellow.  Keep all follow-up visits.   Contact a doctor if:  You do not get better after 1-2 days.  Your symptoms go away and then come back. Get help right away if:  You have very bad back pain.  You have very bad pain in your lower belly.  You have a fever.  You have chills.  You feeling like you will vomit or you  vomit. Summary  A urinary tract infection (UTI) is an infection of any part of the urinary tract.  This condition is caused by germs in your genital area.  There are many risk factors for a UTI.  Treatment includes antibiotic medicines.  Drink enough fluid to keep your pee pale yellow. This information is not intended to replace advice given to you by your  health care provider. Make sure you discuss any questions you have with your health care provider. Document Revised: 10/26/2019 Document Reviewed: 10/26/2019 Elsevier Patient Education  [place].

## 2020-07-21 NOTE — Progress Notes (Signed)
07/21/20 1147  PT Evaluation Information  Last PT Received On 07/21/20  Assistance Needed +1  History of Present Illness 80 y.o. female presenting to the emergency department with 1 day of chest pain.  Patient reports that she has been under significant stress recently that she attributes to a previously active lifestyle but now with difficulty walking due to Charcot foot. medical history significant for CAD with stents in 2007, CVA, chronic diastolic CHF, breast cancer, type 2 diabetes mellitus, peptic ulcer disease, and anxiety, chronic hypoxemia on 2 L as needed.  Precautions  Precautions Fall;Other (comment)  Precaution Comments Pt has charcot foot  Restrictions  Weight Bearing Restrictions No  Home Living  Family/patient expects to be discharged to: Private residence  Living Arrangements Spouse/significant other  Available Help at Discharge Available 24 hours/day  Type of [place] entrance  [place] One level  Bathroom Shower/Tub Walk-in shower  Bathroom Toilet Handicapped height  Bathroom Accessibility Yes  [place] - 2 wheels;Walker - 4 wheels;BSC;Shower seat;Grab bars - toilet;Grab bars - tub/shower;Wheelchair - manual  Prior Function  Level of Independence Needs assistance  Gait / Transfers Assistance Needed Pt uses RW vs cane to ambulate and transfer  ADL's / [place] Pt able to complete all ADL's with supervision for showering from husband/daughter. Husband does all cooking and cleaning,  Communication / Swallowing Assistance Needed none  Comments Pt reports not driving  Communication  Communication No difficulties  Pain Assessment  Pain Assessment No/denies pain  Cognition  Arousal/Alertness Awake/alert  Behavior During Therapy WFL for tasks assessed/performed  Overall Cognitive Status Within Functional Limits for tasks assessed  Upper Extremity Assessment  Upper Extremity Assessment Defer to OT evaluation   Lower Extremity Assessment  Lower Extremity Assessment RLE deficits/detail;LLE deficits/detail  RLE Deficits / Details Charcot foot; toe out posturing during gait.  LLE Deficits / Details Charcot foot; toe out posturing during gait.  Cervical / Trunk Assessment  Cervical / Trunk Assessment Kyphotic  Bed Mobility  Overal bed mobility Modified Independent  General bed mobility comments Pt able to complete supine to sit and sit to supine with no assistance and HOB elevated. Pt reported that she has a bed at home that the [facility] elevates.  Transfers  Overall transfer level Needs assistance  Equipment used Rolling walker (2 wheeled)  Transfers Sit to/from Stand  Sit to Stand Min guard  General transfer comment Min guard for safety. Demonstrated safe hand placement.  Ambulation/Gait  Ambulation/Gait assistance Min guard  Gait Distance (Feet) 40 Feet  Assistive device Rolling walker (2 wheeled)  Gait Pattern/deviations Step-through pattern;Decreased stride length  General Gait Details Guarded gait, with mild unsteadiness noted. Min guard for safety. Toe out gait secondary to charcot foot. Educated about using RW consistently during mobility for increased safety.  Gait velocity Decreased  Balance  Overall balance assessment Needs assistance  Sitting-balance support Feet supported  Sitting balance-Leahy Scale Good  Standing balance support During functional activity;Bilateral upper extremity supported  Standing balance-Leahy Scale Poor  Standing balance comment Reliant on BUE support  General Comments  General comments (skin integrity, edema, etc.) Oxygen sats from 92-96% on RA throughout  PT - End of Session  Equipment Utilized During Treatment Gait belt  Activity Tolerance Patient tolerated treatment well  Patient left in bed;with call bell/phone within reach;with bed alarm set  Nurse Communication Mobility status  PT Assessment  PT Recommendation/Assessment Patient needs continued PT  services  PT Visit Diagnosis  Unsteadiness on feet (R26.81);Muscle weakness (generalized) (M62.81);Difficulty in walking, not elsewhere classified (R26.2)  PT Problem List Decreased strength;Decreased range of motion;Decreased balance;Decreased mobility  PT Plan  PT Frequency (ACUTE ONLY) Min 3X/week  PT Treatment/Interventions (ACUTE ONLY) DME instruction;Gait training;Functional mobility training;Therapeutic activities;Therapeutic exercise;Balance training;Patient/family education  AM-PAC PT "6 Clicks" Mobility Outcome Measure (Version 2)  Help needed turning from your back to your side while in a flat bed without using bedrails? 4  Help needed moving from lying on your back to sitting on the side of a flat bed without using bedrails? 4  Help needed moving to and from a bed to a chair (including a wheelchair)? 3  Help needed standing up from a chair using your arms (e.g., wheelchair or bedside chair)? 3  Help needed to walk in hospital room? 3  Help needed climbing 3-5 steps with a railing?  2  6 Click Score 19  Consider Recommendation of Discharge To: Home with [facility]  PT Recommendation  Follow Up Recommendations Home health PT  PT equipment None recommended by PT  Individuals Consulted  Consulted and Agree with Results and Recommendations Patient  Acute Rehab PT Goals  Patient Stated Goal To go back home  PT Goal Formulation With patient  Time For Goal Achievement 08/04/20  Potential to Achieve Goals Good  PT Time Calculation  PT Start Time (ACUTE ONLY) 1128  PT Stop Time (ACUTE ONLY) 1143  PT Time Calculation (min) (ACUTE ONLY) 15 min  PT General Charges  $$ ACUTE PT VISIT 1 Visit  PT Evaluation  $PT Eval Low Complexity 1 Low  Written Expression  Dominant Hand Right    Pt admitted secondary to problem above with deficits below. Requiring min guard for mobility tasks using RW this session. Educated about using RW predominately vs cane to increase safety. Pt reports her husband is  available to assist when needed. Feel she would benefit from HHPT at d/c. Will continue to follow acutely.   Reuel Derby, PT, DPT  Acute Rehabilitation Services  Pager: [phone] Office: [phone]

## 2020-07-21 NOTE — Discharge Summary (Signed)
Discharge Summary  [name] MPN:[id number] DOB: 06-24-1940  PCP: [name], DO  Admit date: 07/18/2020 Discharge date: 07/21/2020  Time spent: 35 minutes  Recommendations for Outpatient Follow-up:  1. Follow up with your cardiologist 2. Follow up with your PCP 3. Take your medications as prescribed  Discharge Diagnoses:  Active Hospital Problems   Diagnosis Date Noted  . Chest pain 07/19/2020  . PUD (peptic ulcer disease) 07/19/2020  . History of CVA (cerebrovascular accident) 02/21/2020  . Acute respiratory failure with hypoxia ([place]) 02/21/2020  . CKD (chronic kidney disease) stage 4, GFR 15-29 ml/min (HCC) 02/08/2020  . Chronic diastolic CHF (congestive heart failure), NYHA class 2 ([place]) 02/28/2018  . 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 Mild cognitive impairment 01/08/2015  . Anxiety 10/28/2012    Class: Acute  . CAD S/P percutaneous coronary angioplasty     Resolved Hospital Problems  No resolved problems to display.    Discharge Condition: Stable  Diet recommendation: Heart healthy carb modified diet.  Vitals:   07/21/20 0456 07/21/20 1010  BP: 123/80 133/83  Pulse: 76   Resp: 19   Temp: 98.1 F (36.7 C)   SpO2: 95%     History of present illness:  [name] a 80 y.o.femalewith medical history significant forCAD with stents in 2007, CVA, chronic diastolic CHF, breast cancer, type 2 diabetes mellitus, peptic ulcer disease, and anxiety, chronic hypoxemia on 2 L as needed, now presenting to the emergency department with 1 day of chest pain. Patient reports that she has been under significant stress recently that she attributes to a previously active lifestyle but now with difficulty walking due to Charcot foot. She then developed pain in the central chest while at rest yesterday (07/18/20) morning and has had constant pain since then that she describes as a tightness, moderate in intensity, some radiation towards  the left, associated with nausea without vomiting, and also associated with shortness of breath. She reports that her chronic bilateral lower extremity swelling is not as bad as usual. She reports a mild nonproductive cough that persists after a recent respiratory illness. She is not sure of most of the medications that she is taking.   Upon arrival to the ED, patient is found to be afebrile, saturating mid 90s on 2 L/min supplemental oxygen, tachycardic in the 110s, and with stable blood pressure. EKG features sinus tachycardia with rate 116. Chest x-Driver notable for right basilar atelectasis. Chemistry panel features a glucose of 417 and creatinine 1.75. CBC notable for leukocytosis to 12,300. High-sensitivity troponin is normal x2.  UA positive for pyuria. COVID-19 screening test negative.  Bilateral lower extremity Doppler ultrasound negative for DVT, VQ scan low probability for pulmonary embolism.  07/21/20:  Seen and examined at bedside.  No acute events overnight.  She has no complaints.  She states she feels better.  Her UTI symptoms have resolved.  She is eager to go home.  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  Elevated D-dimer, rule out  pulmonary embolism VQ scan is ordered and pending. Could not obtain CT angio chest due to CKD. Follow results.  AKI on CKD 3B Baseline creatinine appears to be 1.3 with GFR of 38. Presented with creatinine of 1.75 Avoid nephrotoxic agents Monitor urine output Repeat renal panel in the morning.  Symptomatic pyuria, presumptive UTI. - Patient complained of dysuria, UA consistent with infection  Continue Rocephin Urine culture still pending.  Acute on chronic hypoxic respiratory failure Self reports at baseline she is on 2 L as needed for dyspnea On presentation she required 2 L to maintain a saturation greater than 90%. D-dimer was elevated, VQ scan is pending. Maintain O2 saturation greater than 92% Wean off oxygen supplementation as tolerated.  Uncontrolled type II DM -Serum glucose 417 on admission without DKA - Hold off home oral hypoglycemics. -A1c was 8.8% in November 2021  History of CVA -Continue Plavix and statin  Breast cancer -Continue Aromasin  Chronic diastolic CHF -Appears compensated, EF was 60-65% in November 2021 -Patient reports recent change in diuretics but asks that we call her husband tomorrow to clarify what she is taking, plan to [facility] for now Strict I's and O's and daily weight.  Ambulatory dysfunction/Charcot foot PT OT to assess Fall precautions. Ambulates with a walker at baseline.    Code Status:Full  Family Communication:Updated her husband via phone on 07/21/2020.  Consults called:None    Discharge Exam: BP 133/83   Pulse 76   Temp 98.1 F (36.7 C)   Resp 19   Ht $R'5\' 7"'AQ$  (1.702 m) Comment: Per patient  Wt 79.2 kg   SpO2 95%   BMI 27.35 kg/m  . General: 80 y.o. year-old female well developed well nourished in no acute distress.  Alert and oriented x3. . Cardiovascular: Regular rate and rhythm with no rubs or gallops.  No thyromegaly or JVD noted.   Marland Kitchen Respiratory: Clear to auscultation with  no wheezes or rales. Good inspiratory effort. . Abdomen: Soft nontender nondistended with normal bowel sounds x4 quadrants. . Musculoskeletal: Trace lower extremity edema.  Feet deformity from Charcot. . Skin: No ulcerative lesions noted or rashes . Psychiatry: Mood is appropriate for condition and setting  Discharge Instructions You were cared for by a hospitalist during your hospital stay. If you have any questions about your discharge medications or the care you received while you were in the hospital after you are discharged, you can call the unit and asked to speak with the hospitalist on call if the hospitalist that took care of you is not available. Once you are discharged, your primary care physician will handle any further medical issues. Please note that NO REFILLS for any discharge medications will be authorized once you are discharged, as it is imperative that you return to your primary care physician (or establish a relationship with a primary care physician if you do not have one) for your aftercare needs so that they can reassess your need for medications and monitor your lab values.   Allergies as of 07/21/2020      Reactions   Nsaids Nausea And Vomiting, Palpitations   Reglan [metoclopramide] Other (See Comments)   Seizures  Tramadol Other (See Comments)   Seizures    Ultram [tramadol] Other (See Comments)   Pt has seizure activity with this medication   Lipitor [atorvastatin] Other (See Comments)   Bone and muscle pain   Lipitor [atorvastatin] Swelling, Other (See Comments)   Pain    Lyrica [pregabalin] Other (See Comments)   Weight gain, extremity swelling   Lyrica [pregabalin] Swelling   Reglan [metoclopramide] Other (See Comments)   Tardive dyskinesia; paradoxical reaction not relieved by benadryl   Nsaids Swelling   SWELLING REACTION UNSPECIFIED    Urecholine [bethanechol]    Palpitations and nausea      Medication List    STOP taking these medications    amoxicillin-clavulanate 500-125 MG tablet Commonly known as: AUGMENTIN     TAKE these medications   acetaminophen-codeine 300-30 MG tablet Commonly known as: TYLENOL #3 Take 1 tablet by mouth every 4 (four) hours as needed for moderate pain.   BIOTIN PO Take 1 tablet by mouth daily.   bumetanide 2 MG tablet Commonly known as: BUMEX Take 2 mg by mouth 3 (three) times daily.   Calcium Carb-Cholecalciferol 600-800 MG-UNIT Tabs Take 1 tablet by mouth daily.   cephALEXin 500 MG capsule Commonly known as: KEFLEX Take 1 capsule (500 mg total) by mouth every 8 (eight) hours for 4 days.   Cerefolin NAC 6-2-600 MG Tabs TAKE 1 TABLET BY MOUTH EVERY DAY   clopidogrel 75 MG tablet Commonly known as: PLAVIX Take 75 mg by mouth daily.   Colcrys 0.6 MG tablet Generic drug: colchicine TAKE 1 TABLET DAILY AS NEEDED FOR GOUT FLARE.   COQ-10 PO Take 1 tablet by mouth daily.   Cranberry 500 MG Tabs Take 500 mg by mouth every evening.   CRANBERRY-CALCIUM PO Take 1 tablet by mouth daily.   diazepam 5 MG tablet Commonly known as: VALIUM Take 5 mg by mouth 3 (three) times daily as needed for anxiety.   exemestane 25 MG tablet Commonly known as: AROMASIN TAKE 1 TABLET DAILY AFTER BREAKFAST What changed:   how much to take  how to take this  when to take this   febuxostat 40 MG tablet Commonly known as: ULORIC Take 40 mg by mouth daily.   freestyle lancets Test blood sugars up to 4 times daily   FREESTYLE LITE test strip Generic drug: glucose blood Test blood sugars up to 4 times daily   FreeStyle Lite w/Device Kit use as directed   gabapentin 300 MG capsule Commonly known as: NEURONTIN Take 300 mg by mouth 2 (two) times daily.   insulin glargine 100 UNIT/ML Solostar Pen Commonly known as: LANTUS Inject 5 Units into the skin daily.   Insulin Pen Needle 32G X 4 MM Misc 5 Units by Does not apply route in the morning and at bedtime.   PenTips 32G X 4 MM  Misc Generic drug: Insulin Pen Needle Use as directed   Januvia 25 MG tablet Generic drug: sitaGLIPtin Take 25 mg by mouth daily.   Krill Oil 500 MG Caps Take 500 mg by mouth daily.   metolazone 2.5 MG tablet Commonly known as: ZAROXOLYN Take 2.5 mg by mouth daily. 30 minutes before Bumetanide Mon. & Thurs.   metoprolol succinate 25 MG 24 hr tablet Commonly known as: TOPROL-XL Take 25 mg by mouth at bedtime.   mirtazapine 15 MG tablet Commonly known as: REMERON Take 15 mg by mouth at bedtime. Taking 1 1/2 tablet at bedtime   multivitamin with minerals Tabs tablet  Take 1 tablet by mouth daily.   nitroGLYCERIN 0.4 MG/SPRAY spray Commonly known as: NITROLINGUAL Place 1 spray under the tongue every 5 (five) minutes x 3 doses as needed for chest pain.   pantoprazole 40 MG tablet Commonly known as: PROTONIX Take 1 tablet (40 mg total) by mouth 2 (two) times daily before a meal.   potassium chloride SA 20 MEQ tablet Commonly known as: KLOR-CON Take 20 mEq by mouth 2 (two) times daily.   pramipexole 0.25 MG tablet Commonly known as: MIRAPEX TAKE 1 TO 2 TABLETS AS INSTRUCTED, 1 TABLET AT 4:00 P.M. AND 2 TABLETS AT BEDTIME   RESVERATROL-GRAPE PO Take 500 mg by mouth.   rosuvastatin 20 MG tablet Commonly known as: CRESTOR Take 20 mg by mouth daily.   saccharomyces boulardii 250 MG capsule Commonly known as: FLORASTOR Take 1 capsule (250 mg total) by mouth 2 (two) times daily for 7 days.   Vitamin D 50 MCG (2000 UT) tablet Take 2,000 Units by mouth daily.      Allergies  Allergen Reactions  . Nsaids Nausea And Vomiting and Palpitations  . Reglan [Metoclopramide] Other (See Comments)    Seizures   . Tramadol Other (See Comments)    Seizures   . Ultram [Tramadol] Other (See Comments)    Pt has seizure activity with this medication  . Lipitor [Atorvastatin] Other (See Comments)    Bone and muscle pain  . Lipitor [Atorvastatin] Swelling and Other (See Comments)     Pain   . Lyrica [Pregabalin] Other (See Comments)    Weight gain, extremity swelling  . Lyrica [Pregabalin] Swelling  . Reglan [Metoclopramide] Other (See Comments)    Tardive dyskinesia; paradoxical reaction not relieved by benadryl  . Nsaids Swelling    SWELLING REACTION UNSPECIFIED   . Urecholine [Bethanechol]     Palpitations and nausea    Follow-up Information    [name], DO. Call on 07/24/2020.   Specialty: General Practice Why: 12:40 seeing NP Oneal Deputy information: [place] [address] [phone]        Leonie Man, MD .   Specialty: Cardiology Contact information: [address] [place] [address] [place] Follow up.   Specialty: Home Health Services Why: HHPT,HHOT,  [phone]               The results of significant diagnostics from this hospitalization (including imaging, microbiology, ancillary and laboratory) are listed below for reference.    Significant Diagnostic Studies: DG Chest 1 View  Result Date: 07/18/2020 CLINICAL DATA:  Chest pain and shortness of breath EXAM: CHEST  1 VIEW COMPARISON:  02/18/2020 FINDINGS: There is right basilar atelectasis. Calcific aortic atherosclerosis. No focal airspace consolidation or pulmonary edema. Normal pleural spaces. IMPRESSION: Right basilar atelectasis. Electronically Signed   By: Ulyses Jarred M.D.   On: 07/18/2020 21:06   DG Chest 2 View  Result Date: 07/21/2020 CLINICAL DATA:  Cough with chest pain. EXAM: CHEST - 2 VIEW COMPARISON:  07/18/2020 FINDINGS: The lungs are clear without focal pneumonia, edema, pneumothorax or pleural effusion. Interstitial markings are diffusely coarsened with chronic features. Bandlike atelectasis or scarring at the right base is similar to prior. Cardiopericardial silhouette is at upper limits of normal for size. Telemetry leads overlie the chest. IMPRESSION: Chronic interstitial  changes without acute cardiopulmonary findings. Electronically Signed   By: Misty Stanley M.D.   On: 07/21/2020  08:59   NM Pulmonary Perf and Vent  Result Date: 07/21/2020 CLINICAL DATA:  Shortness of breath and chest pain. Elevated D-dimer EXAM: NUCLEAR MEDICINE PERFUSION LUNG SCAN TECHNIQUE: Perfusion images were obtained in multiple projections after intravenous injection of radiopharmaceutical. Views: Anterior, posterior, left lateral, right lateral, RPO, LPO, RAO, LAO RADIOPHARMACEUTICALS:  3.9 mCi Tc-26m MAA IV COMPARISON:  Chest radiograph July 21, 2020 FINDINGS: Radiotracer uptake bilaterally is homogeneous and symmetric. No appreciable perfusion defects. IMPRESSION: No appreciable perfusion defects. Very low probability of pulmonary embolus. Electronically Signed   By: Lowella Grip III M.D.   On: 07/21/2020 11:19   VAS [place] LOWER EXTREMITY VENOUS (DVT)  Result Date: 07/20/2020  Lower Venous DVT Study Patient Name:  [facility] GARNER Justin  Date of Exam:   07/19/2020 Medical Rec #: [id number]         Accession #:    [id number] Date of Birth: [birth year]         Patient Gender: F Patient Age:   79Y Exam Location:  [facility] Procedure:      VAS [place] LOWER EXTREMITY VENOUS (DVT) Referring Phys: [id number] Crown --------------------------------------------------------------------------------  Indications: Elevated Ddimer.  Risk Factors: None identified. Comparison Study: No prior studies. Performing Technologist: Oliver Hum RVT  Examination Guidelines: A complete evaluation includes B-mode imaging, spectral Doppler, color Doppler, and power Doppler as needed of all accessible portions of each vessel. Bilateral testing is considered an integral part of a complete examination. Limited examinations for reoccurring indications may be performed as noted. The reflux portion of the exam is performed with the patient in reverse Trendelenburg.   +---------+---------------+---------+-----------+----------+--------------+ RIGHT    CompressibilityPhasicitySpontaneityPropertiesThrombus Aging +---------+---------------+---------+-----------+----------+--------------+ CFV      Full           Yes      Yes                                 +---------+---------------+---------+-----------+----------+--------------+ SFJ      Full                                                        +---------+---------------+---------+-----------+----------+--------------+ FV Prox  Full                                                        +---------+---------------+---------+-----------+----------+--------------+ FV Mid   Full                                                        +---------+---------------+---------+-----------+----------+--------------+ FV DistalFull                                                        +---------+---------------+---------+-----------+----------+--------------+ PFV      Full                                                        +---------+---------------+---------+-----------+----------+--------------+  POP      Full           Yes      Yes                                 +---------+---------------+---------+-----------+----------+--------------+ PTV      Full                                                        +---------+---------------+---------+-----------+----------+--------------+ PERO     Full                                                        +---------+---------------+---------+-----------+----------+--------------+   +---------+---------------+---------+-----------+----------+--------------+ LEFT     CompressibilityPhasicitySpontaneityPropertiesThrombus Aging +---------+---------------+---------+-----------+----------+--------------+ CFV      Full           Yes      Yes                                  +---------+---------------+---------+-----------+----------+--------------+ SFJ      Full                                                        +---------+---------------+---------+-----------+----------+--------------+ FV Prox  Full                                                        +---------+---------------+---------+-----------+----------+--------------+ FV Mid   Full                                                        +---------+---------------+---------+-----------+----------+--------------+ FV DistalFull                                                        +---------+---------------+---------+-----------+----------+--------------+ PFV      Full                                                        +---------+---------------+---------+-----------+----------+--------------+ POP      Full           Yes      Yes                                 +---------+---------------+---------+-----------+----------+--------------+  PTV      Full                                                        +---------+---------------+---------+-----------+----------+--------------+ PERO     Full                                                        +---------+---------------+---------+-----------+----------+--------------+     Summary: RIGHT: - There is no evidence of deep vein thrombosis in the lower extremity.  - No cystic structure found in the popliteal fossa.  LEFT: - There is no evidence of deep vein thrombosis in the lower extremity.  - No cystic structure found in the popliteal fossa.  *See table(s) above for measurements and observations. Electronically signed by Jamelle Haring on 07/20/2020 at 8:53:41 AM.    Final     Microbiology: Recent Results (from the past 240 hour(s))  SARS CORONAVIRUS 2 (TAT 6-24 HRS) Nasopharyngeal Nasopharyngeal Swab     Status: None   Collection Time: 07/19/20 12:37 AM   Specimen: Nasopharyngeal Swab  Result Value Ref Range  Status   SARS Coronavirus 2 NEGATIVE NEGATIVE Final    Comment: (NOTE) SARS-CoV-2 target nucleic acids are NOT DETECTED.  The SARS-CoV-2 RNA is generally detectable in upper and lower respiratory specimens during the acute phase of infection. Negative results do not preclude SARS-CoV-2 infection, do not rule out co-infections with other pathogens, and should not be used as the sole basis for treatment or other patient management decisions. Negative results must be combined with clinical observations, patient history, and epidemiological information. The expected result is Negative.  Fact Sheet for Patients: SugarRoll.be  Fact Sheet for Healthcare Providers: https://www.woods-mathews.com/  This test is not yet approved or cleared by the [place] FDA and  has been authorized for detection and/or diagnosis of SARS-CoV-2 by FDA under an Emergency Use Authorization (EUA). This EUA will remain  in effect (meaning this test can be used) for the duration of the COVID-19 declaration under Se ction 564(b)(1) of the Act, 21 U.S.C. section 360bbb-3(b)(1), unless the authorization is terminated or revoked sooner.  Performed at [facility], [place] [address] [place], [place] [address]      Labs: Basic Metabolic Panel: Recent Labs  Lab 07/18/20 2030 07/19/20 0237 07/20/20 0934 07/21/20 0838  NA 129* 133* 134* 137  K 4.5 4.3 4.2 4.1  CL 91* 92* 99 99  CO2 $Re'24 29 23 30  'XyC$ GLUCOSE 417* 353* 310* 228*  BUN $Re'20 20 18 16  'BYT$ CREATININE 1.75* 1.69* 1.38* 1.35*  CALCIUM 9.5 9.6 9.6 9.8  MG  --  2.2  --   --    Liver Function Tests: Recent Labs  Lab 07/18/20 2030  AST 33  ALT 22  ALKPHOS 110  BILITOT 0.5  PROT 7.0  ALBUMIN 3.2*   Recent Labs  Lab 07/19/20 0237  LIPASE 44   No results for input(s): AMMONIA in the last 168 hours. CBC: Recent Labs  Lab 07/18/20 2030 07/19/20 0237 07/21/20 0838  WBC 12.3* 9.2 6.9  NEUTROABS  10.3*  --   --   HGB 14.0 14.0 13.3  HCT 43.4 43.3 42.1  MCV 85.6 84.7 86.8  PLT 251 215 207   Cardiac Enzymes: No results for input(s): CKTOTAL, CKMB, CKMBINDEX, TROPONINI in the last 168 hours. BNP: BNP (last 3 results) Recent Labs    02/08/20 0801  BNP 35.1    ProBNP (last 3 results) No results for input(s): PROBNP in the last 8760 hours.  CBG: Recent Labs  Lab 07/20/20 2004 07/20/20 2347 07/21/20 0421 07/21/20 0751 07/21/20 1133  GLUCAP 287* 299* 251* 234* 181*       Signed:  Kayleen Memos, MD [facility] 07/21/2020, 2:31 PM

## 2020-07-21 NOTE — Evaluation (Signed)
Occupational Therapy Evaluation Patient Details Name: [name] MRN: [id number] DOB: 03/22/1941 Today's Date: 07/21/2020    History of Present Illness 80 y.o. female with medical history significant for CAD with stents in 2007, CVA, chronic diastolic CHF, breast cancer, type 2 diabetes mellitus, peptic ulcer disease, and anxiety, chronic hypoxemia on 2 L as needed, now presenting to the emergency department with 1 day of chest pain.  Patient reports that she has been under significant stress recently that she attributes to a previously active lifestyle but now with difficulty walking due to Charcot foot.   Clinical Impression   Pt admitted to the ED for concerns listed above. PTA pt reported being independent with all ADL's and having her husband/family provide supervision when she was showering for safety. Pt demonstrating weakness and balance impairments due to charcot foot, however, pt was able to tolerate completing all grooming at the sink and toileting in the bathroom. Pt requires min guard at this time due to balance concerns. Pt will benefit from continued OT services to address concerns listed below.     Follow Up Recommendations  Home health OT    Equipment Recommendations  None recommended by OT    Recommendations for Other Services       Precautions / Restrictions Precautions Precautions: Fall;Other (comment) Precaution Comments: Pt has charcot foot Restrictions Weight Bearing Restrictions: No      Mobility Bed Mobility Overal bed mobility: Modified Independent             General bed mobility comments: Pt able to complete supine to sit and sit to supine with no assistance and HOB elevated. Pt reported that she has a bed at home that the [facility] elevates.    Transfers Overall transfer level: Needs assistance Equipment used: Rolling walker (2 wheeled) Transfers: Sit to/from Stand Sit to Stand: Min guard              Balance Overall balance  assessment: Needs assistance Sitting-balance support: Feet supported Sitting balance-Leahy Scale: Good     Standing balance support: Single extremity supported;During functional activity Standing balance-Leahy Scale: Fair Standing balance comment: Pt able to complete dynamic standing at sink, however requred to maintain 1 hand on sink ledge for balance.                           ADL either performed or assessed with clinical judgement   ADL Overall ADL's : Needs assistance/impaired Eating/Feeding: Sitting;Modified independent   Grooming: Wash/dry hands;Wash/dry face;Oral care;Applying deodorant;Brushing hair;Min guard;Standing Grooming Details (indicate cue type and reason): Completed standing at sink, no LOB Upper Body Bathing: Min guard;Standing Upper Body Bathing Details (indicate cue type and reason): completed standing at sink Lower Body Bathing: Min guard;Sit to/from stand Lower Body Bathing Details (indicate cue type and reason): Pt completed standing at sink Upper Body Dressing : Modified independent;Sitting   Lower Body Dressing: Supervision/safety;Sitting/lateral leans;Sit to/from stand Lower Body Dressing Details (indicate cue type and reason): Pt donned socks EOB Toilet Transfer: Min guard;Ambulation;Regular Toilet;RW   Toileting- Water quality scientist and Hygiene: Min guard;Sitting/lateral lean;Sit to/from stand       Functional mobility during ADLs: Min guard;Rolling walker General ADL Comments: Pt able to complete all ADL's in standing, with chair behind her in case she became fatigued. Pt reports sitting for most ADL's at home, however wanted to do them standing today. Pt had no LOB.     Vision Baseline Vision/History: Wears glasses Wears Glasses: Reading  only Patient Visual Report: No change from baseline Vision Assessment?: No apparent visual deficits     Perception Perception Perception Tested?: No   Praxis Praxis Praxis tested?: Not tested     Pertinent Vitals/Pain Pain Assessment: No/denies pain     Hand Dominance Right   Extremity/Trunk Assessment Upper Extremity Assessment Upper Extremity Assessment: Overall WFL for tasks assessed   Lower Extremity Assessment Lower Extremity Assessment: Defer to PT evaluation   Cervical / Trunk Assessment Cervical / Trunk Assessment: Kyphotic   Communication Communication Communication: No difficulties   Cognition Arousal/Alertness: Awake/alert Behavior During Therapy: WFL for tasks assessed/performed Overall Cognitive Status: Within Functional Limits for tasks assessed                                     General Comments  Pt O2 dropped into the 80's during functional activity and pt reported dizziness upon sitting after activity. After 2 mins at rest on RA and completing pursued lip breathing pt O2 increased to 97%.    Exercises     Shoulder Instructions      Home Living Family/patient expects to be discharged to:: Private residence Living Arrangements: Spouse/significant other Available Help at Discharge: Available 24 hours/day Type of Home: House Home Access: Ramped entrance     Home Layout: One level     Bathroom Shower/Tub: Occupational psychologist: Handicapped height Bathroom Accessibility: Yes How Accessible: Accessible via wheelchair Home Equipment: [place] - 2 wheels;Walker - 4 wheels;Bedside commode;Shower seat;Grab bars - toilet;Grab bars - tub/shower;Wheelchair - manual          Prior Functioning/Environment Level of Independence: Independent with assistive device(s);Needs assistance  Gait / Transfers Assistance Needed: Pt uses RW to ambulate and transfer ADL's / Homemaking Assistance Needed: Pt able to complete all ADL's with supervision for showering from husband/daughter. Husband does all cooking and cleaning, Communication / Swallowing Assistance Needed: none Comments: Pt reports not driving        OT Problem List:  Decreased strength;Decreased activity tolerance;Impaired balance (sitting and/or standing);Decreased coordination;Decreased safety awareness;Decreased knowledge of use of DME or AE      OT Treatment/Interventions: Self-care/ADL training;Therapeutic exercise;Energy conservation;DME and/or AE instruction;Therapeutic activities;Patient/family education;Balance training    OT Goals(Current goals can be found in the care plan section) Acute Rehab OT Goals Patient Stated Goal: To go back home OT Goal Formulation: With patient Time For Goal Achievement: 08/04/20 Potential to Achieve Goals: Good ADL Goals Pt Will Perform Grooming: with modified independence;standing Pt Will Perform Lower Body Bathing: with modified independence;sitting/lateral leans;sit to/from stand Pt Will Transfer to Toilet: with modified independence;regular height toilet;ambulating Additional ADL Goal #1: Pt will report 3 energy conservation techniques.  OT Frequency: Min 2X/week   Barriers to D/C:            Co-evaluation              AM-PAC OT "6 Clicks" Daily Activity     Outcome Measure Help from another person eating meals?: None Help from another person taking care of personal grooming?: A Little Help from another person toileting, which includes using toliet, bedpan, or urinal?: A Little Help from another person bathing (including washing, rinsing, drying)?: A Little Help from another person to put on and taking off regular upper body clothing?: A Little Help from another person to put on and taking off regular lower body clothing?: A Little 6 Click Score: 19  End of Session Equipment Utilized During Treatment: Gait belt;Rolling walker Nurse Communication: Mobility status;Other (comment) (dizziness and O2 sats)  Activity Tolerance: Patient tolerated treatment well Patient left: in bed;with call bell/phone within reach  OT Visit Diagnosis: Unsteadiness on feet (R26.81);Muscle weakness (generalized)  (M62.81);Other abnormalities of gait and mobility (R26.89)                Time: 4982-6415 OT Time Calculation (min): 57 min Charges:  OT General Charges $OT Visit: 1 Visit OT Evaluation $OT Eval Moderate Complexity: 1 Mod OT Treatments $Self Care/Home Management : 38-52 mins  Consetta Cosner H., OTR/L Acute Rehabilitation  Kamayah Pillay Elane Chanell Nadeau 07/21/2020, 11:16 AM

## 2020-07-21 NOTE — Progress Notes (Signed)
Chaplain responded to consult for prayer. Chaplain visited with patient and found her to be the sweetest most special lady! She was excited to be going home and was stopping by the beauty salon to get her hair and nails done on the way home. Husband is picking her up. He is her care giver. Chaplain prayed with patient and sat as she talked about her life. Chaplain was blessed more than patient!    07/21/20 1500  Clinical Encounter Type  Visited With Patient  Visit Type Spiritual support  Referral From Nurse  Consult/Referral To Chaplain  Spiritual Encounters  Spiritual Needs Emotional;Prayer

## 2020-07-21 NOTE — Plan of Care (Signed)
  Problem: Education: Goal: Knowledge of General Education information will improve Description: Including pain rating scale, medication(s)/side effects and non-pharmacologic comfort measures Outcome: Adequate for Discharge   

## 2020-07-21 NOTE — TOC Initial Note (Signed)
Transition of Care [facility]) - Initial/Assessment Note    Patient Details  Name: [name] MRN: [id number] Date of Birth: [birth year]  Transition of Care [facility]) CM/SW Contact:    Zenon Mayo, RN Phone Number: 07/21/2020, 9:32 AM  Clinical Narrative:                 NCM spoke with patient at bedside, she lives with spouse, he does the cooking for her and he takes her to  Her MD apst.  She has rolling walker at home.  She uses the [facility] on [address], but would like for TOC to fill meds for her before dc.  She states her spouse will transport her home.  NCM offered choice for [facility] , she states she has had HH before with [facility] and would like to have them again.  NCM made referral to [facility] with [facility] for [place], [place]. She is able to take referral , soc will begin 24 to 48 hrs post dc.   Expected Discharge Plan: [place] Barriers to Discharge: No Barriers Identified   Patient Goals and CMS Choice Patient states their goals for this hospitalization and ongoing recovery are:: return home CMS Medicare.gov Compare Post Acute Care list provided to:: Patient Choice offered to / list presented to : Patient  Expected Discharge Plan and Services Expected Discharge Plan: [place] In-house Referral: NA Discharge Planning Services: CM Consult   Living arrangements for the past 2 months: Single Family Home Expected Discharge Date: 07/21/20                 DME Agency: NA       HH Arranged: PT,OT [place] Agency: [place] ([facility]) Date HH Agency Contacted: 07/21/20 Time HH Agency Contacted: 0932 Representative spoke with at [place]: [place] Arrangements/Services Living arrangements for the past 2 months: [place] Lives with:: Spouse Patient language and need for interpreter reviewed:: Yes Do you feel safe going back to the place where you live?: Yes      Need for Family Participation in Patient Care:  Yes (Comment) Care giver support system in place?: Yes (comment) Current home services: DME (rolling walker and tall toilet) Criminal Activity/Legal Involvement Pertinent to Current Situation/Hospitalization: No - Comment as needed  Activities of Daily Living Home Assistive Devices/Equipment: Oxygen,CBG Meter ADL Screening (condition at time of admission) Patient's cognitive ability adequate to safely complete daily activities?: Yes Is the patient deaf or have difficulty hearing?: No Does the patient have difficulty seeing, even when wearing glasses/contacts?: No Does the patient have difficulty concentrating, remembering, or making decisions?: No Patient able to express need for assistance with ADLs?: Yes Does the patient have difficulty dressing or bathing?: No Independently performs ADLs?: Yes (appropriate for developmental age) Does the patient have difficulty walking or climbing stairs?: Yes Weakness of Legs: Both (Charcot foot) Weakness of Arms/Hands: None  Permission Sought/Granted                  Emotional Assessment Appearance:: Appears stated age Attitude/Demeanor/Rapport: Engaged Affect (typically observed): Appropriate Orientation: : Oriented to Self,Oriented to Place,Oriented to  Time,Oriented to Situation Alcohol / Substance Use: Not Applicable Psych Involvement: No (comment)  Admission diagnosis:  SOB (shortness of breath) [R06.02] Acute chest pain [R07.9] History of coronary artery disease [Z86.79] Chest pain [R07.9] Patient Active Problem List   Diagnosis Date Noted  . Chest pain 07/19/2020  . PUD (peptic ulcer disease)  07/19/2020  . History of coronary artery disease   . Pulmonary emboli ([place]) 02/24/2020  . Pulmonary nodule 02/21/2020  . Acute respiratory failure with hypoxia ([place]) 02/21/2020  . S/p left hip fracture 02/21/2020  . History of CVA (cerebrovascular accident) 02/21/2020  . Acute renal failure superimposed on stage 4 chronic kidney  disease ([place]) 02/21/2020  . Acute blood loss anemia 02/19/2020  . GIB (gastrointestinal bleeding)   . Subdural hemorrhage ([place]) 02/15/2020  . CAD (coronary artery disease) 02/08/2020  . CKD (chronic kidney disease) stage 4, GFR 15-29 ml/min (HCC) 02/08/2020  . Chronic diastolic CHF (congestive heart failure) ([place]) 02/08/2020  . Type 2 diabetes mellitus with vascular disease ([place]) 02/08/2020  . Hip fracture ([place]) 02/08/2020  . Closed fracture of neck of left femur ([place]) 02/08/2020  . Somnolence, daytime 04/06/2018  . Chronic diastolic CHF (congestive heart failure), NYHA class 2 ([place]) 02/28/2018  . Hypotension   . Hypomagnesemia 02/25/2018  . Hypokalemia 02/24/2018  . Urinary retention 02/24/2018  . Posterior tibial tendinitis of right leg 02/22/2018  . Posterior tibial tendinitis, right leg   . Genetic testing 12/03/2016  . 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 Malignant neoplasm of upper-outer quadrant of left breast in female, estrogen receptor positive ([place]) 11/19/2016  . Chronic idiopathic gout involving toe of left foot without tophus 11/15/2016  . Malignant 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, estrogen receptor positive ([place]) 11/09/2016  . Tendon tear, ankle, left, sequela   . Traumatic rupture of left anterior tibial tendon 04/20/2016  . Acute kidney injury superimposed on CKD ([place])   . 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 ([place])   . Headache, migraine   . 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 Restless legs syndrome 07/24/2015  . Mild cognitive impairment 01/08/2015  . Abnormality of gait 01/08/2015  . Dizziness 12/19/2014  . Preoperative cardiovascular examination 12/11/2013  . GERD (gastroesophageal reflux disease) 10/06/2013  . Syncope 10/05/2013  . Anxiety 10/28/2012    Class: Acute  . Bilateral lower extremity edema 10/28/2012  . CAD S/P percutaneous coronary angioplasty   . Essential  hypertension   . Dyslipidemia, goal LDL below 70    PCP:  [name], DO Pharmacy:   [facility], [place] [address] [place] [address] Phone: [phone] Fax: [phone]  [place], [place] [address] [place] [address] Phone: [phone] Fax: [phone]  [facility] [phone] [place], [place] [facility] [address] [facility] [address] AT [place] [address] Phone: [phone] Fax: [phone]  [facility] [address] [place] [address] Phone: [phone] Fax: [phone]     Social Determinants of Health (SDOH) Interventions    Readmission Risk Interventions Readmission Risk Prevention Plan 07/21/2020  Transportation Screening Complete  Medication Review ([place]) Complete  PCP or Specialist appointment within 3-5 days of discharge Complete  HRI or [place] Complete  SW Recovery Care/Counseling Consult Complete  [place] Not Applicable  Some recent data might be hidden

## 2020-07-21 NOTE — Progress Notes (Signed)
Inpatient Diabetes Program Recommendations  AACE/ADA: New Consensus Statement on Inpatient Glycemic Control (2015)  Target Ranges:  Prepandial:   less than 140 mg/dL      Peak postprandial:   less than 180 mg/dL (1-2 hours)      Critically ill patients:  140 - 180 mg/dL   Lab Results  Component Value Date   GLUCAP 181 (H) 07/21/2020   HGBA1C 12.8 (H) 07/19/2020    Review of Glycemic Control Results for [name], [name] (MRN [id number]) as of 07/21/2020 15:23  Ref. Range 07/21/2020 07:51 07/21/2020 11:33  Glucose-Capillary Latest Ref Range: 70 - 99 mg/dL 234 (H) 181 (H)    Spoke with patient at bedside.  Reviewed patient's current A1c of 12.8% (average blood sugar of 320 mg/dL). Explained what a A1c is and what it measures. Also reviewed goal A1c with patient, importance of good glucose control @ home, and blood sugar goals.  She states her husband administers all of her medications.  Spoke with patient's nurse; she used to be on insulin and her PCP switched her to [place] and [place] her insulin some time ago.  Recently her PCP has prescribed her Levemir and Novolog to start again.  The insulins should be delivered in the next couple of days.    Spoke with patient about diet and limiting CHO's.  She drinks juice everyday.  Encouraged her to switch to non-caloric beverages.   Ordered LWWD booklet and insulin pen starter kit.  Attaching education to AVS.    Will continue to follow while inpatient.  Thank you, Reche Dixon, RN, BSN Diabetes Coordinator Inpatient Diabetes Program [phone] (team pager from 8a-5p)

## 2020-07-22 ENCOUNTER — Other Ambulatory Visit: Payer: Self-pay | Admitting: *Deleted

## 2020-07-22 ENCOUNTER — Other Ambulatory Visit: Payer: Self-pay

## 2020-07-22 LAB — URINE CULTURE: Culture: <10000 — AB

## 2020-07-22 NOTE — Patient Outreach (Signed)
. [facility]) Care Management  [facility] Care Manager  07/22/2020   [name] May 24, 1940 [id number]  Subjective: Telephone assessment PAC. [name] was hospitalized on 4/22 with CP, no cardiovascular cause was found, however, pt was hyperglycemic and had a UTI. She was treated for both and discharged yesterday 07/21/20.  Today, [name] feels for the most part very well. She does report a mild HA which she has not needed to take anything for it. She does report that her FBS today was 731 which is of great concern. Her diabetic regimen at present is Jardiance 25 mg daily and Lantus 5 units in the pm.  Encounter Medications:  Outpatient Encounter Medications as of 07/22/2020  Medication Sig  . acetaminophen-codeine (TYLENOL #3) 300-30 MG tablet Take 1 tablet by mouth every 4 (four) hours as needed for moderate pain.  Marland Kitchen BIOTIN PO Take 1 tablet by mouth daily.  . Blood Glucose Monitoring Suppl (FREESTYLE LITE) w/Device KIT use as directed  . bumetanide (BUMEX) 2 MG tablet Take 2 mg by mouth 3 (three) times daily.  . Calcium Carb-Cholecalciferol 600-800 MG-UNIT TABS Take 1 tablet by mouth daily.  . cephALEXin (KEFLEX) 500 MG capsule Take 1 capsule (500 mg total) by mouth every 8 (eight) hours for 4 days.  . Cholecalciferol (VITAMIN D) 50 MCG (2000 UT) tablet Take 2,000 Units by mouth daily.  . clopidogrel (PLAVIX) 75 MG tablet Take 75 mg by mouth daily.  . Coenzyme Q10 (COQ-10 PO) Take 1 tablet by mouth daily.  Marland Kitchen COLCRYS 0.6 MG tablet TAKE 1 TABLET DAILY AS NEEDED FOR GOUT FLARE.  Marland Kitchen Cranberry 500 MG TABS Take 500 mg by mouth every evening.  . diazepam (VALIUM) 5 MG tablet Take 5 mg by mouth 3 (three) times daily as needed for anxiety.  Marland Kitchen exemestane (AROMASIN) 25 MG tablet TAKE 1 TABLET DAILY AFTER BREAKFAST (Patient taking differently: Take 25 mg by mouth daily after breakfast. TAKE 1 TABLET DAILY AFTER BREAKFAST)  . febuxostat (ULORIC) 40 MG tablet Take 40 mg by mouth daily.   Marland Kitchen gabapentin (NEURONTIN) 300 MG capsule Take 300 mg by mouth 2 (two) times daily.   Marland Kitchen glucose blood (FREESTYLE LITE) test strip Test blood sugars up to 4 times daily  . insulin glargine (LANTUS) 100 UNIT/ML Solostar Pen Inject 5 Units into the skin daily.  Marland Kitchen JANUVIA 25 MG tablet Take 25 mg by mouth daily.  Javier Docker Oil 500 MG CAPS Take 500 mg by mouth daily.  . Methylfol-Methylcob-Acetylcyst (CEREFOLIN NAC) 6-2-600 MG TABS TAKE 1 TABLET BY MOUTH EVERY DAY  . metolazone (ZAROXOLYN) 2.5 MG tablet Take 2.5 mg by mouth daily. 30 minutes before Bumetanide Mon. & Thurs.  . metoprolol succinate (TOPROL-XL) 25 MG 24 hr tablet Take 25 mg by mouth at bedtime.   . mirtazapine (REMERON) 15 MG tablet Take 15 mg by mouth at bedtime. Taking 1 1/2 tablet at bedtime  . Multiple Vitamin (MULTIVITAMIN WITH MINERALS) TABS tablet Take 1 tablet by mouth daily.  . nitroGLYCERIN (NITROLINGUAL) 0.4 MG/SPRAY spray Place 1 spray under the tongue every 5 (five) minutes x 3 doses as needed for chest pain.  . pantoprazole (PROTONIX) 40 MG tablet Take 1 tablet (40 mg total) by mouth 2 (two) times daily before a meal.  . potassium chloride SA (KLOR-CON) 20 MEQ tablet Take 20 mEq by mouth 2 (two) times daily.  . pramipexole (MIRAPEX) 0.25 MG tablet TAKE 1 TO 2 TABLETS AS INSTRUCTED, 1 TABLET AT 4:00  P.M. AND 2 TABLETS AT BEDTIME  . RESVERATROL-GRAPE PO Take 500 mg by mouth.  . rosuvastatin (CRESTOR) 20 MG tablet Take 20 mg by mouth daily.  Marland Kitchen saccharomyces boulardii (FLORASTOR) 250 MG capsule Take 1 capsule (250 mg total) by mouth 2 (two) times daily for 7 days.  Marland Kitchen CRANBERRY-CALCIUM PO Take 1 tablet by mouth daily.  . Insulin Pen Needle (PENTIPS) 32G X 4 MM MISC Use as directed  . Insulin Pen Needle 32G X 4 MM MISC 5 Units by Does not apply route in the morning and at bedtime.  . Lancets (FREESTYLE) lancets Test blood sugars up to 4 times daily   No facility-administered encounter medications on file as of 07/22/2020.     Functional Status:   Fall/Depression Screening: Fall Risk  04/16/2020 04/04/2020 11/20/2019  Falls in the past year? _0 Number falls in past yr: _1 Comment - - -  Injury with Fall? 1 1 0  Risk for fall due to : History of fall(s);Impaired balance/gait;Impaired mobility;Medication side effect;Orthopedic patient History of fall(s);Impaired balance/gait;Impaired mobility;Medication side effect;Orthopedic patient History of fall(s);Impaired balance/gait;Impaired mobility  Risk for fall due to: Comment - - -  Follow up Falls evaluation completed Falls evaluation completed;Education provided;Falls prevention discussed Falls evaluation completed   PHQ 2/9 Scores 04/16/2020 06/19/2019 03/19/2019 01/16/2019 10/25/2018 07/24/2018 03/31/2018  PHQ - 2 Score 1 0 0 0 0 0 0    Assessment:  Hyperglycemia                         UTI resolving.  Goals Addressed              This Visit's Progress     Patient Stated   .  Will monitor glucose daily as ordered and follow protocol for elevated glucose levels as reported on each call over the next 90 days. (pt-stated)        Start date: 07/22/20 High Priority Long term Expected end date: 10/24/28  Next follow up: 08/25/20  Notes: 07/22/20 FBS today 731! Instructed to call PCP at once for instructions. Pt states she would do so. NP to follow closely. Current regimen needs critical update. Called MD office to ensure level was reported.      Other   .  COMPLETED: Make and Keep All Appointments        Timeframe:  Short-Term Goal Priority:  High Start Date:     04/30/20                   Expected End Date:   Extend goal for another 30 days to 07/21/20.     Follow Up Date 07/21/20 - call to cancel if needed - keep a calendar with appointment dates    Why is this important?    Part of staying healthy is seeing the doctor for follow-up care.   If you forget your appointments, there are some things you can do to stay on track.    Notes: 04/30/20 Pt  did cancel an appt with her endocrinologist. She plans on seeking another doctor. Encouraged to not delay doing so, as her diabetes management should be a priority for her health. We have extended this goal so she an an opportunity to schedule an eye exam, a foot exam and a new endocrinologist. 06/19/20 Pt acknowledges need to schedule primary care appt to close gaps in care. She needs foot exam, urine microalbumin, pap, ?colonoscopy and  needs to schedule an eye exam. She needs her HgbA1C rechecked last was 8.8. She says she will make an eye appt and primary care appt today. 07/22/20 Pt has been compliant with making her appts. SHe has just been hospitalizaed and is now home. Starting a new goal regarding closing her care gaps.    .  COMPLETED: Matintain My Quality of Life as evidenced by equal or improved QOL score at the end of March        Timeframe:  Long-Range Goal Priority:  High Start Date:       1//722                      Expected End Date:    06/26/20, Extend another month to 07/21/20.                 Follow Up Date 07/21/20  - complete a living will - do one enjoyable thing every day - do something different, like talking to a new person or going to a new place, every day - learn something new by asking, reading and searching the Internet every day - meditate daily - spend time outdoors at least 3 times a week    Why is this important?    Having a long-term illness can be scary.   It can also be stressful for you and your caregiver.   These steps may help.    Notes: 04/30/20 Received advanced directives. Encouraged to complete, get notorized and make copies for self, MD and those involved with decision making. Discuss emergent scenarios with family and advise of desires 06/19/20 Pt had HCPOA nearly completed needs to be notarized and copied and given to MD to scan into her chart. Quality of life score actually went from 8 to a 63 indicating a slight decline however, pt says she has adapted  to her new normal and is trying to enjoy life as much as she can. Encouraged completion of her HCPOA and living will. 07/22/20 has completed her MOST form and it is in her chart - wants to be a FULL CODE. She has just been discharged from the hospital after a 4 day stay. C/O CP but no cardiac event was noted. UTI and hyperglycemia are being treated. Although she has had this set back she is recovering and reports her quality of life is good.    .  Protect My Health as evidenced by closing all care gaps by 06/26/20.   Not on track     Timeframe:  Long-Range Goal Priority:  High Start Date:       04/04/20                      Expected End Date:      06/26/20 , extended to 07/21/20, extending an additional 3 months to 10/25/20.            Follow Up Date 08/25/20 - schedule recommended health tests (blood work, mammogram, colonoscopy, pap test) - schedule and keep appointment for annual check-up    Notes: 04/16/20 Had to cancel an appt last week, did not feel well. She does have it rescheduled. This is not an issue for her. Reviewed vacs which are up to date. Eye exam and dental exams are due. Pt to make appts. 04/30/20 Pt did not make appts. Encouraged that doing these self maintenance tasks are part of Medicare required tasks for members. They are important to avoid complications.  She did have Hgb A1C in November which was high 8.8. Today she reports her FBS and NFBS are <160. NP called primary to verify if she has had pneumonia, flu and Td vaccines. Verified her 3 Moderna Covid vaccines today. 06/19/20 Pt has not seen her primary and acknowledges she needs to do so. Gave her a list of things that she needs help from [name] to complete: foot exam, HgbA1C, pap, urine for protein, mammogram, and possible colonoscopy. She also needs a diabetic eye exam. Had pt write up this list so she can check everything off by the time we talk again in one month. 07/22/20 Pt just hospitalized for CP but no cardiac event. Has UTI and  hyperglycemia. Will see new primary care provider on Thursday. FBS today 731!!! Advised to call primary office ASAP for instructions.        Plan: Called MD to inform of reported HIGH glucose reading. We agreed to talk again next week.  Follow-up:  Patient agrees to Care Plan and Follow-up.  [name]. [name], MSN, [facility] Gerontological Nurse Practitioner [facility] Care Management [phone]

## 2020-07-22 NOTE — Patient Outreach (Signed)
[facility]) Care Management  07/21/20  [name] Dec 30, 1940 [id number]   Pt is hospitalized, pending discharge today. Will call tomorrow.  [name]. [name], MSN, [facility] Gerontological Nurse Practitioner [facility] Care Management [phone]

## 2020-07-23 ENCOUNTER — Other Ambulatory Visit: Payer: Self-pay | Admitting: *Deleted

## 2020-07-23 DIAGNOSIS — I25119 Atherosclerotic heart disease of native coronary artery with unspecified angina pectoris: Secondary | ICD-10-CM | POA: Diagnosis not present

## 2020-07-23 DIAGNOSIS — G43909 Migraine, unspecified, not intractable, without status migrainosus: Secondary | ICD-10-CM | POA: Diagnosis not present

## 2020-07-23 DIAGNOSIS — N184 Chronic kidney disease, stage 4 (severe): Secondary | ICD-10-CM | POA: Diagnosis not present

## 2020-07-23 DIAGNOSIS — N390 Urinary tract infection, site not specified: Secondary | ICD-10-CM | POA: Diagnosis not present

## 2020-07-23 DIAGNOSIS — I130 Hypertensive heart and chronic kidney disease with heart failure and stage 1 through stage 4 chronic kidney disease, or unspecified chronic kidney disease: Secondary | ICD-10-CM | POA: Diagnosis not present

## 2020-07-23 DIAGNOSIS — K279 Peptic ulcer, site unspecified, unspecified as acute or chronic, without hemorrhage or perforation: Secondary | ICD-10-CM | POA: Diagnosis not present

## 2020-07-23 DIAGNOSIS — E11610 Type 2 diabetes mellitus with diabetic neuropathic arthropathy: Secondary | ICD-10-CM | POA: Diagnosis not present

## 2020-07-23 DIAGNOSIS — F32A Depression, unspecified: Secondary | ICD-10-CM | POA: Diagnosis not present

## 2020-07-23 DIAGNOSIS — M1990 Unspecified osteoarthritis, unspecified site: Secondary | ICD-10-CM | POA: Diagnosis not present

## 2020-07-23 DIAGNOSIS — C50919 Malignant neoplasm of unspecified site of unspecified female breast: Secondary | ICD-10-CM | POA: Diagnosis not present

## 2020-07-23 DIAGNOSIS — K219 Gastro-esophageal reflux disease without esophagitis: Secondary | ICD-10-CM | POA: Diagnosis not present

## 2020-07-23 DIAGNOSIS — Z95818 Presence of other cardiac implants and grafts: Secondary | ICD-10-CM | POA: Diagnosis not present

## 2020-07-23 DIAGNOSIS — G2581 Restless legs syndrome: Secondary | ICD-10-CM | POA: Diagnosis not present

## 2020-07-23 DIAGNOSIS — E1140 Type 2 diabetes mellitus with diabetic neuropathy, unspecified: Secondary | ICD-10-CM | POA: Diagnosis not present

## 2020-07-23 DIAGNOSIS — Z9981 Dependence on supplemental oxygen: Secondary | ICD-10-CM | POA: Diagnosis not present

## 2020-07-23 DIAGNOSIS — F419 Anxiety disorder, unspecified: Secondary | ICD-10-CM | POA: Diagnosis not present

## 2020-07-23 DIAGNOSIS — I5032 Chronic diastolic (congestive) heart failure: Secondary | ICD-10-CM | POA: Diagnosis not present

## 2020-07-23 DIAGNOSIS — E1165 Type 2 diabetes mellitus with hyperglycemia: Secondary | ICD-10-CM | POA: Diagnosis not present

## 2020-07-23 DIAGNOSIS — Z7984 Long term (current) use of oral hypoglycemic drugs: Secondary | ICD-10-CM | POA: Diagnosis not present

## 2020-07-23 DIAGNOSIS — Z8673 Personal history of transient ischemic attack (TIA), and cerebral infarction without residual deficits: Secondary | ICD-10-CM | POA: Diagnosis not present

## 2020-07-23 DIAGNOSIS — Z85820 Personal history of malignant melanoma of skin: Secondary | ICD-10-CM | POA: Diagnosis not present

## 2020-07-23 DIAGNOSIS — J9621 Acute and chronic respiratory failure with hypoxia: Secondary | ICD-10-CM | POA: Diagnosis not present

## 2020-07-23 DIAGNOSIS — E1122 Type 2 diabetes mellitus with diabetic chronic kidney disease: Secondary | ICD-10-CM | POA: Diagnosis not present

## 2020-07-23 DIAGNOSIS — B0229 Other postherpetic nervous system involvement: Secondary | ICD-10-CM | POA: Diagnosis not present

## 2020-07-23 NOTE — Patient Outreach (Signed)
[facility]) Care Management  07/23/2020  [name] March 30, 1940 [id number]   Telephone outreach.  Goals Addressed              This Visit's Progress     Patient Stated   .  Will monitor glucose daily as ordered and follow protocol for elevated glucose levels as reported on each call over the next 90 days. (pt-stated)        Start date: 07/22/20 High Priority Long term Expected end date: 10/24/28  Next follow up: 08/25/20  Notes: 07/22/20 FBS today 731! Instructed to call PCP at once for instructions. Pt states she would do so. NP to follow closely. Current regimen needs critical update. Called MD office to ensure level was reported. 07/23/20 Called to find out pt FBS today, she took it while we were on the phone together and it was 449. She will see her NP tomorrow. Advised for her to take both her monitors to the office so all readings can be noted and appropriate orders can be given for her extremely high readings. Husband was part of the conversation today also. Pt is on antibiotic for UTI and is still symptomatic so no doubt this is playing a part in her hyperglycemia.      Will call pt next week as scheduled previously.  [name]. [name], MSN, [facility] Gerontological Nurse Practitioner [facility] Care Management [phone]

## 2020-07-24 DIAGNOSIS — Z79899 Other long term (current) drug therapy: Secondary | ICD-10-CM | POA: Diagnosis not present

## 2020-07-24 DIAGNOSIS — E663 Overweight: Secondary | ICD-10-CM | POA: Diagnosis not present

## 2020-07-24 DIAGNOSIS — I25118 Atherosclerotic heart disease of native coronary artery with other forms of angina pectoris: Secondary | ICD-10-CM | POA: Diagnosis not present

## 2020-07-24 DIAGNOSIS — N184 Chronic kidney disease, stage 4 (severe): Secondary | ICD-10-CM | POA: Diagnosis not present

## 2020-07-24 DIAGNOSIS — E785 Hyperlipidemia, unspecified: Secondary | ICD-10-CM | POA: Diagnosis not present

## 2020-07-24 DIAGNOSIS — E1122 Type 2 diabetes mellitus with diabetic chronic kidney disease: Secondary | ICD-10-CM | POA: Diagnosis not present

## 2020-07-24 DIAGNOSIS — F0390 Unspecified dementia without behavioral disturbance: Secondary | ICD-10-CM | POA: Diagnosis not present

## 2020-07-24 DIAGNOSIS — Z7189 Other specified counseling: Secondary | ICD-10-CM | POA: Diagnosis not present

## 2020-07-24 DIAGNOSIS — D6859 Other primary thrombophilia: Secondary | ICD-10-CM | POA: Diagnosis not present

## 2020-07-24 DIAGNOSIS — E1142 Type 2 diabetes mellitus with diabetic polyneuropathy: Secondary | ICD-10-CM | POA: Diagnosis not present

## 2020-07-24 DIAGNOSIS — F330 Major depressive disorder, recurrent, mild: Secondary | ICD-10-CM | POA: Diagnosis not present

## 2020-07-24 DIAGNOSIS — E11610 Type 2 diabetes mellitus with diabetic neuropathic arthropathy: Secondary | ICD-10-CM | POA: Diagnosis not present

## 2020-07-30 ENCOUNTER — Other Ambulatory Visit: Payer: Self-pay | Admitting: *Deleted

## 2020-07-30 ENCOUNTER — Other Ambulatory Visit: Payer: Self-pay

## 2020-07-30 NOTE — Patient Outreach (Signed)
[facility]) Care Management  [place]  07/30/2020   Shalene Gallen Chasteen 1940-08-24 [id number]  Subjective: Telephone assessment. Talked to Mr. Hollingshead as he is in charge of pt's medication administration. Mr. Thibeaux is knowledgeable and very assertive in advising provider that he is following orders. He reports Mrs. Nevins mobility  continues to improve and DM control is improving.  Encounter Medications:  Outpatient Encounter Medications as of 07/30/2020  Medication Sig Note  . glipiZIDE (GLUCOTROL) 5 MG tablet Take 2.5 mg by mouth daily before breakfast. 07/30/2020: Has not started yet but will get this medication today and start.  . insulin glargine (LANTUS) 100 UNIT/ML Solostar Pen Inject 5 Units into the skin daily. 07/30/2020: Increased to 10 units last week by primary care.  . insulin regular (NOVOLIN R) 100 units/mL injection Inject into the skin 3 (three) times daily before meals. Started last week 4/28: Sliding scale before meals: <150 none,                                                                           151-199 - 2 units                                                                            200-249 - 4 units                                                                            250-299 - 6 units                                                                            300-349 - 8 units                                                                            350> 10 units   . acetaminophen-codeine (TYLENOL #3) 300-30 MG tablet Take 1 tablet by mouth every 4 (four) hours as needed for moderate pain.   Marland Kitchen BIOTIN PO Take 1 tablet by mouth daily.   . Blood Glucose Monitoring Suppl (FREESTYLE LITE) w/Device KIT  use as directed   . bumetanide (BUMEX) 2 MG tablet Take 2 mg by mouth 3 (three) times daily.   . Calcium Carb-Cholecalciferol 600-800 MG-UNIT TABS Take 1 tablet by mouth daily.   . Cholecalciferol (VITAMIN D) 50 MCG (2000 UT) tablet Take 2,000 Units  by mouth daily.   . clopidogrel (PLAVIX) 75 MG tablet Take 75 mg by mouth daily.   . Coenzyme Q10 (COQ-10 PO) Take 1 tablet by mouth daily.   Marland Kitchen COLCRYS 0.6 MG tablet TAKE 1 TABLET DAILY AS NEEDED FOR GOUT FLARE.   Marland Kitchen Cranberry 500 MG TABS Take 500 mg by mouth every evening.   Marland Kitchen CRANBERRY-CALCIUM PO Take 1 tablet by mouth daily.   . diazepam (VALIUM) 5 MG tablet Take 5 mg by mouth 3 (three) times daily as needed for anxiety.   Marland Kitchen exemestane (AROMASIN) 25 MG tablet TAKE 1 TABLET DAILY AFTER BREAKFAST (Patient taking differently: Take 25 mg by mouth daily after breakfast. TAKE 1 TABLET DAILY AFTER BREAKFAST)   . febuxostat (ULORIC) 40 MG tablet Take 40 mg by mouth daily.   Marland Kitchen gabapentin (NEURONTIN) 300 MG capsule Take 300 mg by mouth 2 (two) times daily.    Marland Kitchen glucose blood (FREESTYLE LITE) test strip Test blood sugars up to 4 times daily   . Insulin Pen Needle (PENTIPS) 32G X 4 MM MISC Use as directed   . Insulin Pen Needle 32G X 4 MM MISC 5 Units by Does not apply route in the morning and at bedtime.   Marland Kitchen JANUVIA 25 MG tablet Take 25 mg by mouth daily.   Boris Lown Oil 500 MG CAPS Take 500 mg by mouth daily.   . Lancets (FREESTYLE) lancets Test blood sugars up to 4 times daily   . Methylfol-Methylcob-Acetylcyst (CEREFOLIN NAC) 6-2-600 MG TABS TAKE 1 TABLET BY MOUTH EVERY DAY   . metolazone (ZAROXOLYN) 2.5 MG tablet Take 2.5 mg by mouth daily. 30 minutes before Bumetanide Mon. & Thurs.   . metoprolol succinate (TOPROL-XL) 25 MG 24 hr tablet Take 25 mg by mouth at bedtime.    . mirtazapine (REMERON) 15 MG tablet Take 15 mg by mouth at bedtime. Taking 1 1/2 tablet at bedtime   . Multiple Vitamin (MULTIVITAMIN WITH MINERALS) TABS tablet Take 1 tablet by mouth daily.   . nitroGLYCERIN (NITROLINGUAL) 0.4 MG/SPRAY spray Place 1 spray under the tongue every 5 (five) minutes x 3 doses as needed for chest pain.   . pantoprazole (PROTONIX) 40 MG tablet Take 1 tablet (40 mg total) by mouth 2 (two) times daily  before a meal.   . potassium chloride SA (KLOR-CON) 20 MEQ tablet Take 20 mEq by mouth 2 (two) times daily.   . pramipexole (MIRAPEX) 0.25 MG tablet TAKE 1 TO 2 TABLETS AS INSTRUCTED, 1 TABLET AT 4:00 P.M. AND 2 TABLETS AT BEDTIME   . RESVERATROL-GRAPE PO Take 500 mg by mouth.   . rosuvastatin (CRESTOR) 20 MG tablet Take 20 mg by mouth daily.    No facility-administered encounter medications on file as of 07/30/2020.   Functional Status:  In your present state of health, do you have any difficulty performing the following activities: 07/22/2020 07/19/2020  Hearing? - N  Vision? - N  Difficulty concentrating or making decisions? - N  Walking or climbing stairs? - Y  Dressing or bathing? - N  Doing errands, shopping? - Y  Comment - -  Preparing Food and eating ? N -  Using the Toilet? N -  In the past six months, have you accidently leaked urine? N -  Do you have problems with loss of bowel control? N -  Managing your Medications? N -  Comment husband assists with med box filling weekly. -  Managing your Finances? N -  Housekeeping or managing your Housekeeping? Y -  Some recent data might be hidden    Fall/Depression Screening: Fall Risk  04/16/2020 04/04/2020 11/20/2019  Falls in the past year? $RemoveBe'1 1 1  'nqcRKnLzy$ Number falls in past yr: $Remove'1 1 1  'VtsIJfs$ Comment - - -  Injury with Fall? 1 1 0  Risk for fall due to : History of fall(s);Impaired balance/gait;Impaired mobility;Medication side effect;Orthopedic patient History of fall(s);Impaired balance/gait;Impaired mobility;Medication side effect;Orthopedic patient History of fall(s);Impaired balance/gait;Impaired mobility  Risk for fall due to: Comment - - -  Follow up Falls evaluation completed Falls evaluation completed;Education provided;Falls prevention discussed Falls evaluation completed   PHQ 2/9 Scores 04/16/2020 06/19/2019 03/19/2019 01/16/2019 10/25/2018 07/24/2018 03/31/2018  PHQ - 2 Score 1 0 0 0 0 0 0    Assessment:  Goals Addressed               This Visit's Progress     Patient Stated   .  Will monitor glucose daily as ordered and follow protocol for elevated glucose levels as reported on each call over the next 90 days. (pt-stated)         Barriers: Health Behaviors Knowledge Psychosocial Start date: 07/22/20 High Priority Long term Expected end date: 10/24/28  Next follow up: 08/25/20  Notes: 07/22/20 FBS today 731! Instructed to call PCP at once for instructions. Pt states she would do so. NP to follow closely. Current regimen needs critical update. Called MD office to ensure level was reported. 07/23/20 Called to find out pt FBS today, she took it while we were on the phone together and it was 449. She will see her NP tomorrow. Advised for her to take both her monitors to the office so all readings can be noted and appropriate orders can be given for her extremely high readings. Husband was part of the conversation today also. Pt is on antibiotic for UTI and is still symptomatic so no doubt this is playing a part in her hyperglycemia. 07/30/20 Talked with Mr. Schneider today, who expresses great frustration with getting his wife's newly prescribed GLIPIZIDE 5 mg 1/2 tab=2.5 mg. He has worked it out with MD office. Local pharmacy, [facility] and will get medication today and start it. He reports pt Lantus was increased to 10 units also once a day. She is still taking JARDIANCE also. She was also started on a SS Novalog. Mr. Harold is doing a good job trying to check her levels 3 times a day AC and at hs. Blood glucose levels are coming down mostly in the 200's. She had one at 128 in pm before evening meal. He reports he is checking as ordered but had not written down every value.  07/26/20 FBS:283, 11 am 253, 8:45 pm 321, 9:20 >400. 07/27/20 2 pm 341, 11:45 pm 243 07/28/20 4:40 am (alarm) 254, 5:45 Libre registered 335/fingerstick 296, 9:15 am 272    12:40 pm 2:48, 4:40 pm 128, 11:05 pm 287 07/29/20 10:15 am 194, 12N 188 07/30/20 8am 215, 9:22  am 186.  Primary care nurse to call tomorrow for report. OV with primary care provider, Cipriano Mile scheduled for 08/14/20.  Encouraged continuation of regimen and patience while dosages are being titrated to get  her glucose levels at goal without hypoglycemia.      Other   .  Protect My Health as evidenced by closing all care gaps by 06/26/20.        Timeframe:  Long-Range Goal Priority:  High Start Date:       04/04/20                      Expected End Date:      06/26/20 , extended to 07/21/20, extending an additional 3 months to 10/25/20.            Follow Up Date 08/25/20 - schedule recommended health tests (blood work, mammogram, colonoscopy, pap test) - schedule and keep appointment for annual check-up    Notes: 04/16/20 Had to cancel an appt last week, did not feel well. She does have it rescheduled. This is not an issue for her. Reviewed vacs which are up to date. Eye exam and dental exams are due. Pt to make appts. 04/30/20 Pt did not make appts. Encouraged that doing these self maintenance tasks are part of Medicare required tasks for members. They are important to avoid complications. She did have Hgb A1C in November which was high 8.8. Today she reports her FBS and NFBS are <160. NP called primary to verify if she has had pneumonia, flu and Td vaccines. Verified her 3 Moderna Covid vaccines today. 06/19/20 Pt has not seen her primary and acknowledges she needs to do so. Gave her a list of things that she needs help from [name] to complete: foot exam, HgbA1C, pap, urine for protein, mammogram, and possible colonoscopy. She also needs a diabetic eye exam. Had pt write up this list so she can check everything off by the time we talk again in one month. 07/22/20 Pt just hospitalized for CP but no cardiac event. Has UTI and hyperglycemia. Will see new primary care provider on Thursday. FBS today 731!!! Advised to call primary office ASAP for instructions. 07/30/20 Husband working with primary office  and wife to adjust antidiabetic meds to get pt to goals. Will be referred to a dietician per Mr. Hearns. Getting her glucose to goal is priority at this time and then will encourage gap closures.       Plan: Will call for follow up in one week. Follow-up:  Patient agrees to Care Plan and Follow-up.   [name]. [name], MSN, [facility] Gerontological Nurse Practitioner [facility] Care Management [phone]

## 2020-08-06 ENCOUNTER — Other Ambulatory Visit: Payer: Self-pay

## 2020-08-06 ENCOUNTER — Other Ambulatory Visit: Payer: Self-pay | Admitting: *Deleted

## 2020-08-06 NOTE — Patient Outreach (Signed)
[place] [facility]) Care Management  [place]  08/06/2020   [name] 03-04-1941 [id number]  Subjective: Telephone outreach  Encounter Medications:  Outpatient Encounter Medications as of 08/06/2020  Medication Sig Note  . acetaminophen-codeine (TYLENOL #3) 300-30 MG tablet Take 1 tablet by mouth every 4 (four) hours as needed for moderate pain.   Marland Kitchen BIOTIN PO Take 1 tablet by mouth daily.   . Blood Glucose Monitoring Suppl (FREESTYLE LITE) w/Device KIT use as directed   . bumetanide (BUMEX) 2 MG tablet Take 2 mg by mouth 3 (three) times daily.   . Calcium Carb-Cholecalciferol 600-800 MG-UNIT TABS Take 1 tablet by mouth daily.   . Cholecalciferol (VITAMIN D) 50 MCG (2000 UT) tablet Take 2,000 Units by mouth daily.   . clopidogrel (PLAVIX) 75 MG tablet Take 75 mg by mouth daily.   . Coenzyme Q10 (COQ-10 PO) Take 1 tablet by mouth daily.   Marland Kitchen COLCRYS 0.6 MG tablet TAKE 1 TABLET DAILY AS NEEDED FOR GOUT FLARE.   Marland Kitchen Cranberry 500 MG TABS Take 500 mg by mouth every evening.   Marland Kitchen CRANBERRY-CALCIUM PO Take 1 tablet by mouth daily.   . diazepam (VALIUM) 5 MG tablet Take 5 mg by mouth 3 (three) times daily as needed for anxiety.   Marland Kitchen exemestane (AROMASIN) 25 MG tablet TAKE 1 TABLET DAILY AFTER BREAKFAST (Patient taking differently: Take 25 mg by mouth daily after breakfast. TAKE 1 TABLET DAILY AFTER BREAKFAST)   . febuxostat (ULORIC) 40 MG tablet Take 40 mg by mouth daily.   Marland Kitchen gabapentin (NEURONTIN) 300 MG capsule Take 300 mg by mouth 2 (two) times daily.    Marland Kitchen glipiZIDE (GLUCOTROL) 5 MG tablet Take 2.5 mg by mouth daily before breakfast. 07/30/2020: Has not started yet but will get this medication today and start.  Marland Kitchen glucose blood (FREESTYLE LITE) test strip Test blood sugars up to 4 times daily   . insulin glargine (LANTUS) 100 UNIT/ML Solostar Pen Inject 5 Units into the skin daily. 07/30/2020: Increased to 10 units last week by primary care.  . Insulin Pen Needle (PENTIPS)  32G X 4 MM MISC Use as directed   . Insulin Pen Needle 32G X 4 MM MISC 5 Units by Does not apply route in the morning and at bedtime.   . insulin regular (NOVOLIN R) 100 units/mL injection Inject into the skin 3 (three) times daily before meals. Started last week 4/28: Sliding scale before meals: <150 none,                                                                           151-199 - 2 units                                                                            200-249 - 4 units  250-299 - 6 units                                                                            300-349 - 8 units                                                                            350> 10 units   . JANUVIA 25 MG tablet Take 25 mg by mouth daily.   Javier Docker Oil 500 MG CAPS Take 500 mg by mouth daily.   . Lancets (FREESTYLE) lancets Test blood sugars up to 4 times daily   . Methylfol-Methylcob-Acetylcyst (CEREFOLIN NAC) 6-2-600 MG TABS TAKE 1 TABLET BY MOUTH EVERY DAY   . metolazone (ZAROXOLYN) 2.5 MG tablet Take 2.5 mg by mouth daily. 30 minutes before Bumetanide Mon. & Thurs.   . metoprolol succinate (TOPROL-XL) 25 MG 24 hr tablet Take 25 mg by mouth at bedtime.    . mirtazapine (REMERON) 15 MG tablet Take 15 mg by mouth at bedtime. Taking 1 1/2 tablet at bedtime   . Multiple Vitamin (MULTIVITAMIN WITH MINERALS) TABS tablet Take 1 tablet by mouth daily.   . nitroGLYCERIN (NITROLINGUAL) 0.4 MG/SPRAY spray Place 1 spray under the tongue every 5 (five) minutes x 3 doses as needed for chest pain.   . pantoprazole (PROTONIX) 40 MG tablet Take 1 tablet (40 mg total) by mouth 2 (two) times daily before a meal.   . potassium chloride SA (KLOR-CON) 20 MEQ tablet Take 20 mEq by mouth 2 (two) times daily.   . pramipexole (MIRAPEX) 0.25 MG tablet TAKE 1 TO 2 TABLETS AS INSTRUCTED, 1 TABLET AT 4:00 P.M. AND 2 TABLETS AT BEDTIME   .  RESVERATROL-GRAPE PO Take 500 mg by mouth.   . rosuvastatin (CRESTOR) 20 MG tablet Take 20 mg by mouth daily.    No facility-administered encounter medications on file as of 08/06/2020.    Functional Status:  In your present state of health, do you have any difficulty performing the following activities: 07/22/2020 07/19/2020  Hearing? - N  Vision? - N  Difficulty concentrating or making decisions? - N  Walking or climbing stairs? - Y  Dressing or bathing? - N  Doing errands, shopping? - Y  [place] and eating ? N -  Using the Toilet? N -  In the past six months, have you accidently leaked urine? N -  Do you have problems with loss of bowel control? N -  Managing your Medications? N -  Comment husband assists with med box filling weekly. -  Managing your Finances? N -  Housekeeping or managing your Housekeeping? Y -  Some recent data might be hidden    Fall/Depression Screening: Fall Risk  04/16/2020 04/04/2020 11/20/2019  Falls in the past year? _0 Number falls in past yr: _1 Comment - - -  Injury with  Fall? 1 1 0  Risk for fall due to : History of fall(s);Impaired balance/gait;Impaired mobility;Medication side effect;Orthopedic patient History of fall(s);Impaired balance/gait;Impaired mobility;Medication side effect;Orthopedic patient History of fall(s);Impaired balance/gait;Impaired mobility  Risk for fall due to: Comment - - -  Follow up Falls evaluation completed Falls evaluation completed;Education provided;Falls prevention discussed Falls evaluation completed   PHQ 2/9 Scores 08/06/2020 04/16/2020 06/19/2019 03/19/2019 01/16/2019 10/25/2018 07/24/2018  PHQ - 2 Score 3 1 0 0 0 0 0  PHQ- 9 Score 4 - - - - - -    Assessment: DM improving control but much work needed.  Goals Addressed              This Visit's Progress     Patient Stated   .  Will monitor glucose daily as ordered and follow protocol for elevated glucose levels as reported on each  call over the next 90 days. (pt-stated)   On track      Barriers: Health Behaviors Knowledge Psychosocial Start date: 07/22/20 High Priority Long term Expected end date: 10/24/28  Next follow up: 08/25/20  Notes: 07/22/20 FBS today 731! Instructed to call PCP at once for instructions. Pt states she would do so. NP to follow closely. Current regimen needs critical update. Called MD office to ensure level was reported. 07/23/20 Called to find out pt FBS today, she took it while we were on the phone together and it was 449. She will see her NP tomorrow. Advised for her to take both her monitors to the office so all readings can be noted and appropriate orders can be given for her extremely high readings. Husband was part of the conversation today also. Pt is on antibiotic for UTI and is still symptomatic so no doubt this is playing a part in her hyperglycemia. 07/30/20 Talked with Mr. Robertshaw today, who expresses great frustration with getting his wife's newly prescribed GLIPIZIDE 5 mg 1/2 tab=2.5 mg. He has worked it out with MD office. Local pharmacy, [facility] and will get medication today and start it. He reports pt Lantus was increased to 10 units also once a day. She is still taking JARDIANCE also. She was also started on a SS Novalog. Mr. Markgraf is doing a good job trying to check her levels 3 times a day AC and at hs. Blood glucose levels are coming down mostly in the 200's. She had one at 128 in pm before evening meal. He reports he is checking as ordered but had not written down every value.  07/26/20 FBS:283, 11 am 253, 8:45 pm 321, 9:20 >400. 07/27/20 2 pm 341, 11:45 pm 243 07/28/20 4:40 am (alarm) 254, 5:45 Libre registered 335/fingerstick 296, 9:15 am 272    12:40 pm 2:48, 4:40 pm 128, 11:05 pm 287 07/29/20 10:15 am 194, 12N 188 07/30/20 8am 215, 9:22 am 186.  Primary care nurse to call tomorrow for report. OV with primary care provider, Cipriano Mile scheduled for 08/14/20.  Encouraged continuation  of regimen and patience while dosages are being titrated to get her glucose levels at goal without hypoglycemia.  08/06/20 No readings over 400 this week. All but 2 readings 200-337. 2 readings normal at 112 and 146 which were NFBSs. Freestyle alarm goes off when glucose level is >300. PROBLEM: They have administered regular insulin at night when this happens per SS. NP voiced concern that SS insulin is usually only given at meal times and not during the night. Concern that alarm is set too low for high  alarm. Concern that too many glucose checks is making the process more frustrating that it has to be.  Suggested routine meal times and lantus dosing time for optimal control.  Mr. Gordin will call primary office about giving regular insulin at night. Counseled that he is doing a great job but he can relax his mulitple checks within minutes. They do have diabetic nutrition counseling scheduled to start in June.        Other   .  Protect My Health as evidenced by closing all care gaps by 06/26/20.   On track     Timeframe:  Long-Range Goal Priority:  High Start Date:       04/04/20                      Expected End Date:      06/26/20 , extended to 07/21/20, extending an additional 3 months to 10/25/20.            Follow Up Date 08/25/20 - schedule recommended health tests (blood work, mammogram, colonoscopy, pap test) - schedule and keep appointment for annual check-up    Notes: 04/16/20 Had to cancel an appt last week, did not feel well. She does have it rescheduled. This is not an issue for her. Reviewed vacs which are up to date. Eye exam and dental exams are due. Pt to make appts. 04/30/20 Pt did not make appts. Encouraged that doing these self maintenance tasks are part of Medicare required tasks for members. They are important to avoid complications. She did have Hgb A1C in November which was high 8.8. Today she reports her FBS and NFBS are <160. NP called primary to verify if she has had pneumonia, flu and  Td vaccines. Verified her 3 Moderna Covid vaccines today. 06/19/20 Pt has not seen her primary and acknowledges she needs to do so. Gave her a list of things that she needs help from [name] to complete: foot exam, HgbA1C, pap, urine for protein, mammogram, and possible colonoscopy. She also needs a diabetic eye exam. Had pt write up this list so she can check everything off by the time we talk again in one month. 07/22/20 Pt just hospitalized for CP but no cardiac event. Has UTI and hyperglycemia. Will see new primary care provider on Thursday. FBS today 731!!! Advised to call primary office ASAP for instructions. 07/30/20 Husband working with primary office and wife to adjust antidiabetic meds to get pt to goals. Will be referred to a dietician per Mr. Asano. Getting her glucose to goal is priority at this time and then will encourage gap closures. 08/06/20 Continues to concentrate on glucose monitoring and insulin regimen for improved control. Will see primary care provider next week for follow up.       Plan: We agreed to talk again after her primary care appt next week. Our appt scheduled for Friday. Follow-up:  Patient agrees to Care Plan and Follow-up.   [name]. [name], MSN, [facility] Gerontological Nurse Practitioner [facility] Care Management [phone]

## 2020-08-14 DIAGNOSIS — E1165 Type 2 diabetes mellitus with hyperglycemia: Secondary | ICD-10-CM | POA: Diagnosis not present

## 2020-08-14 DIAGNOSIS — G4700 Insomnia, unspecified: Secondary | ICD-10-CM | POA: Diagnosis not present

## 2020-08-14 DIAGNOSIS — N184 Chronic kidney disease, stage 4 (severe): Secondary | ICD-10-CM | POA: Diagnosis not present

## 2020-08-14 DIAGNOSIS — D6859 Other primary thrombophilia: Secondary | ICD-10-CM | POA: Diagnosis not present

## 2020-08-14 DIAGNOSIS — I5032 Chronic diastolic (congestive) heart failure: Secondary | ICD-10-CM | POA: Diagnosis not present

## 2020-08-14 DIAGNOSIS — I130 Hypertensive heart and chronic kidney disease with heart failure and stage 1 through stage 4 chronic kidney disease, or unspecified chronic kidney disease: Secondary | ICD-10-CM | POA: Diagnosis not present

## 2020-08-14 DIAGNOSIS — E1142 Type 2 diabetes mellitus with diabetic polyneuropathy: Secondary | ICD-10-CM | POA: Diagnosis not present

## 2020-08-14 DIAGNOSIS — E663 Overweight: Secondary | ICD-10-CM | POA: Diagnosis not present

## 2020-08-14 DIAGNOSIS — E1122 Type 2 diabetes mellitus with diabetic chronic kidney disease: Secondary | ICD-10-CM | POA: Diagnosis not present

## 2020-08-14 DIAGNOSIS — E785 Hyperlipidemia, unspecified: Secondary | ICD-10-CM | POA: Diagnosis not present

## 2020-08-14 DIAGNOSIS — F330 Major depressive disorder, recurrent, mild: Secondary | ICD-10-CM | POA: Diagnosis not present

## 2020-08-14 DIAGNOSIS — F419 Anxiety disorder, unspecified: Secondary | ICD-10-CM | POA: Diagnosis not present

## 2020-08-15 ENCOUNTER — Other Ambulatory Visit: Payer: Self-pay | Admitting: *Deleted

## 2020-08-15 NOTE — Patient Outreach (Signed)
[place] [facility]) Care Management  08/15/2020  [name] 04-20-1940 [id number]  Weekly telephone outreach to follow up on pt DM control. Left a message this am and called a second time at 4:30 and left a second message.  Will reach out again next week.  [name]. [name], MSN, [facility] Gerontological Nurse Practitioner [facility] Care Management [phone]

## 2020-08-19 ENCOUNTER — Other Ambulatory Visit: Payer: Self-pay | Admitting: *Deleted

## 2020-08-20 NOTE — Patient Outreach (Signed)
[place] [facility]) Care Management  [place]  08/20/2020   [name] 1940-12-17 [id number]  Subjective: Telephone outreach for DM follow up.  Encounter Medications:  Outpatient Encounter Medications as of 08/19/2020  Medication Sig Note  . acetaminophen-codeine (TYLENOL #3) 300-30 MG tablet Take 1 tablet by mouth every 4 (four) hours as needed for moderate pain.   Marland Kitchen BIOTIN PO Take 1 tablet by mouth daily.   . Blood Glucose Monitoring Suppl (FREESTYLE LITE) w/Device KIT use as directed   . bumetanide (BUMEX) 2 MG tablet Take 2 mg by mouth 3 (three) times daily.   . Calcium Carb-Cholecalciferol 600-800 MG-UNIT TABS Take 1 tablet by mouth daily.   . Cholecalciferol (VITAMIN D) 50 MCG (2000 UT) tablet Take 2,000 Units by mouth daily.   . clopidogrel (PLAVIX) 75 MG tablet Take 75 mg by mouth daily.   . Coenzyme Q10 (COQ-10 PO) Take 1 tablet by mouth daily.   Marland Kitchen COLCRYS 0.6 MG tablet TAKE 1 TABLET DAILY AS NEEDED FOR GOUT FLARE.   Marland Kitchen Cranberry 500 MG TABS Take 500 mg by mouth every evening.   Marland Kitchen CRANBERRY-CALCIUM PO Take 1 tablet by mouth daily.   . diazepam (VALIUM) 5 MG tablet Take 5 mg by mouth 3 (three) times daily as needed for anxiety.   Marland Kitchen exemestane (AROMASIN) 25 MG tablet TAKE 1 TABLET DAILY AFTER BREAKFAST (Patient taking differently: Take 25 mg by mouth daily after breakfast. TAKE 1 TABLET DAILY AFTER BREAKFAST)   . febuxostat (ULORIC) 40 MG tablet Take 40 mg by mouth daily.   Marland Kitchen gabapentin (NEURONTIN) 300 MG capsule Take 300 mg by mouth 2 (two) times daily.    Marland Kitchen glipiZIDE (GLUCOTROL) 5 MG tablet Take 2.5 mg by mouth daily before breakfast. 07/30/2020: Has not started yet but will get this medication today and start.  Marland Kitchen glucose blood (FREESTYLE LITE) test strip Test blood sugars up to 4 times daily   . insulin glargine (LANTUS) 100 UNIT/ML Solostar Pen Inject 5 Units into the skin daily. 07/30/2020: Increased to 10 units last week by primary care.  . Insulin Pen  Needle (PENTIPS) 32G X 4 MM MISC Use as directed   . Insulin Pen Needle 32G X 4 MM MISC 5 Units by Does not apply route in the morning and at bedtime.   . insulin regular (NOVOLIN R) 100 units/mL injection Inject into the skin 3 (three) times daily before meals. Started last week 4/28: Sliding scale before meals: <150 none,                                                                           151-199 - 2 units                                                                            200-249 - 4 units  250-299 - 6 units                                                                            300-349 - 8 units                                                                            350> 10 units   . JANUVIA 25 MG tablet Take 25 mg by mouth daily.   Javier Docker Oil 500 MG CAPS Take 500 mg by mouth daily.   . Lancets (FREESTYLE) lancets Test blood sugars up to 4 times daily   . Methylfol-Methylcob-Acetylcyst (CEREFOLIN NAC) 6-2-600 MG TABS TAKE 1 TABLET BY MOUTH EVERY DAY   . metolazone (ZAROXOLYN) 2.5 MG tablet Take 2.5 mg by mouth daily. 30 minutes before Bumetanide Mon. & Thurs.   . metoprolol succinate (TOPROL-XL) 25 MG 24 hr tablet Take 25 mg by mouth at bedtime.    . mirtazapine (REMERON) 15 MG tablet Take 15 mg by mouth at bedtime. Taking 1 1/2 tablet at bedtime   . Multiple Vitamin (MULTIVITAMIN WITH MINERALS) TABS tablet Take 1 tablet by mouth daily.   . nitroGLYCERIN (NITROLINGUAL) 0.4 MG/SPRAY spray Place 1 spray under the tongue every 5 (five) minutes x 3 doses as needed for chest pain.   . pantoprazole (PROTONIX) 40 MG tablet Take 1 tablet (40 mg total) by mouth 2 (two) times daily before a meal.   . potassium chloride SA (KLOR-CON) 20 MEQ tablet Take 20 mEq by mouth 2 (two) times daily.   . pramipexole (MIRAPEX) 0.25 MG tablet TAKE 1 TO 2 TABLETS AS INSTRUCTED, 1 TABLET AT 4:00 P.M. AND 2 TABLETS AT BEDTIME    . RESVERATROL-GRAPE PO Take 500 mg by mouth.   . rosuvastatin (CRESTOR) 20 MG tablet Take 20 mg by mouth daily.    No facility-administered encounter medications on file as of 08/19/2020.   Functional Status:  In your present state of health, do you have any difficulty performing the following activities: 07/22/2020 07/19/2020  Hearing? - N  Vision? - N  Difficulty concentrating or making decisions? - N  Walking or climbing stairs? - Y  Dressing or bathing? - N  Doing errands, shopping? - Y  [place] and eating ? N -  Using the Toilet? N -  In the past six months, have you accidently leaked urine? N -  Do you have problems with loss of bowel control? N -  Managing your Medications? N -  Comment husband assists with med box filling weekly. -  Managing your Finances? N -  Housekeeping or managing your Housekeeping? Y -  Some recent data might be hidden    Fall/Depression Screening: Fall Risk  04/16/2020 04/04/2020 11/20/2019  Falls in the past year? $RemoveBe'1 1 1  'tnDwtVudr$ Number falls in past yr: $Remove'1 1 1  'rWsvTwR$ Comment - - -  Injury with Fall?  1 1 0  Risk for fall due to : History of fall(s);Impaired balance/gait;Impaired mobility;Medication side effect;Orthopedic patient History of fall(s);Impaired balance/gait;Impaired mobility;Medication side effect;Orthopedic patient History of fall(s);Impaired balance/gait;Impaired mobility  Risk for fall due to: Comment - - -  Follow up Falls evaluation completed Falls evaluation completed;Education provided;Falls prevention discussed Falls evaluation completed   PHQ 2/9 Scores 08/06/2020 04/16/2020 06/19/2019 03/19/2019 01/16/2019 10/25/2018 07/24/2018  PHQ - 2 Score 3 1 0 0 0 0 0  PHQ- 9 Score 4 - - - - - -    Assessment: DM with slight improvement in control  Goals Addressed              This Visit's Progress     Patient Stated   .  Will monitor glucose daily as ordered and follow protocol for elevated glucose levels as reported on each  call over the next 90 days. (pt-stated)   On track      Barriers: Health Behaviors Knowledge Psychosocial Start date: 07/22/20 High Priority Long term Expected end date: 10/24/28  Next follow up: 09/05/20  Notes: 07/22/20 FBS today 731! Instructed to call PCP at once for instructions. Pt states she would do so. NP to follow closely. Current regimen needs critical update. Called MD office to ensure level was reported. 07/23/20 Called to find out pt FBS today, she took it while we were on the phone together and it was 449. She will see her NP tomorrow. Advised for her to take both her monitors to the office so all readings can be noted and appropriate orders can be given for her extremely high readings. Husband was part of the conversation today also. Pt is on antibiotic for UTI and is still symptomatic so no doubt this is playing a part in her hyperglycemia. 07/30/20 Talked with Mr. Albee today, who expresses great frustration with getting his wife's newly prescribed GLIPIZIDE 5 mg 1/2 tab=2.5 mg. He has worked it out with MD office. Local pharmacy, [facility] and will get medication today and start it. He reports pt Lantus was increased to 10 units also once a day. She is still taking JARDIANCE also. She was also started on a SS Novalog. Mr. Gaynor is doing a good job trying to check her levels 3 times a day AC and at hs. Blood glucose levels are coming down mostly in the 200's. She had one at 128 in pm before evening meal. He reports he is checking as ordered but had not written down every value.  07/26/20 FBS:283, 11 am 253, 8:45 pm 321, 9:20 >400. 07/27/20 2 pm 341, 11:45 pm 243 07/28/20 4:40 am (alarm) 254, 5:45 Libre registered 335/fingerstick 296, 9:15 am 272    12:40 pm 2:48, 4:40 pm 128, 11:05 pm 287 07/29/20 10:15 am 194, 12N 188 07/30/20 8am 215, 9:22 am 186.  Primary care nurse to call tomorrow for report. OV with primary care provider, Cipriano Mile scheduled for 08/14/20.  Encouraged continuation  of regimen and patience while dosages are being titrated to get her glucose levels at goal without hypoglycemia.  08/06/20 No readings over 400 this week. All but 2 readings 200-337. 2 readings normal at 112 and 146 which were NFBSs. Freestyle alarm goes off when glucose level is >300. PROBLEM: They have administered regular insulin at night when this happens per SS. NP voiced concern that SS insulin is usually only given at meal times and not during the night. Concern that alarm is set too low for high alarm. Concern  that too many glucose checks is making the process more frustrating that it has to be.  Suggested routine meal times and lantus dosing time for optimal control.  Mr. Mcqueary will call primary office about giving regular insulin at night. Counseled that he is doing a great job but he can relax his mulitple checks within minutes. They do have diabetic nutrition counseling scheduled to start in June. 08/20/20 Progress being made although very slowly. Glucose checks 4X a day. No hypoglycemia this week. None in 300 range. 8 were <200, 12 were < 300. No regular insulin given after pm meal. Had decided to give Jardiance in and glipizide in pm, then read package inserts and reversed this schedule so glipizide is given in am and Jardiance in pm. Advised that glipizide is a medication that can cause hypoglycemia. Husband understands. 5/20: 160, 268, 148, 283, 223 5/21:  136, 261, 243, 216 5/22: 250, 247, 286, 177 5/23: 246, 112, 131, 259 5/24: 213, 174, 263 5/25: 185        Other   .  Protect My Health as evidenced by closing all care gaps by 06/26/20.   Not on track     Timeframe:  Long-Range Goal Priority:  High Start Date:       04/04/20                      Expected End Date:      06/26/20 , extended to 07/21/20, extending an additional 3 months to 10/25/20.            Follow Up Date 08/25/20 - schedule recommended health tests (blood work, mammogram, colonoscopy, pap test) - schedule and keep  appointment for annual check-up    Notes: 04/16/20 Had to cancel an appt last week, did not feel well. She does have it rescheduled. This is not an issue for her. Reviewed vacs which are up to date. Eye exam and dental exams are due. Pt to make appts. 04/30/20 Pt did not make appts. Encouraged that doing these self maintenance tasks are part of Medicare required tasks for members. They are important to avoid complications. She did have Hgb A1C in November which was high 8.8. Today she reports her FBS and NFBS are <160. NP called primary to verify if she has had pneumonia, flu and Td vaccines. Verified her 3 Moderna Covid vaccines today. 06/19/20 Pt has not seen her primary and acknowledges she needs to do so. Gave her a list of things that she needs help from [name] to complete: foot exam, HgbA1C, pap, urine for protein, mammogram, and possible colonoscopy. She also needs a diabetic eye exam. Had pt write up this list so she can check everything off by the time we talk again in one month. 07/22/20 Pt just hospitalized for CP but no cardiac event. Has UTI and hyperglycemia. Will see new primary care provider on Thursday. FBS today 731!!! Advised to call primary office ASAP for instructions. 07/30/20 Husband working with primary office and wife to adjust antidiabetic meds to get pt to goals. Will be referred to a dietician per Mr. Malak. Getting her glucose to goal is priority at this time and then will encourage gap closures. 08/06/20 Continues to concentrate on glucose monitoring and insulin regimen for improved control. Will see primary care provider next week for follow up. 08/17/21 No progress  towards gap closures due to focus on glucose control. Saw pt's primary care this week and she has advised that  pt go back to endocrinology. Pt reports her glucose levels are still way off. Husband is to call me back with her readings. Mr. Ambrocio called back 5/25 with results. Values are coming down, no hypoglycemia. 12 readings in  the 200's, None in 300's, 8 readings under 200.       Plan: we agreed to talk again in 2 weeks.  Follow-up:  Patient agrees to Care Plan and Follow-up.   [name]. [name], MSN, [facility] Gerontological Nurse Practitioner [facility] Care Management [phone]

## 2020-08-22 DIAGNOSIS — B0229 Other postherpetic nervous system involvement: Secondary | ICD-10-CM | POA: Diagnosis not present

## 2020-08-22 DIAGNOSIS — I5032 Chronic diastolic (congestive) heart failure: Secondary | ICD-10-CM | POA: Diagnosis not present

## 2020-08-22 DIAGNOSIS — C50919 Malignant neoplasm of unspecified site of unspecified female breast: Secondary | ICD-10-CM | POA: Diagnosis not present

## 2020-08-22 DIAGNOSIS — J9621 Acute and chronic respiratory failure with hypoxia: Secondary | ICD-10-CM | POA: Diagnosis not present

## 2020-08-22 DIAGNOSIS — E1140 Type 2 diabetes mellitus with diabetic neuropathy, unspecified: Secondary | ICD-10-CM | POA: Diagnosis not present

## 2020-08-22 DIAGNOSIS — I130 Hypertensive heart and chronic kidney disease with heart failure and stage 1 through stage 4 chronic kidney disease, or unspecified chronic kidney disease: Secondary | ICD-10-CM | POA: Diagnosis not present

## 2020-08-22 DIAGNOSIS — G2581 Restless legs syndrome: Secondary | ICD-10-CM | POA: Diagnosis not present

## 2020-08-22 DIAGNOSIS — Z85820 Personal history of malignant melanoma of skin: Secondary | ICD-10-CM | POA: Diagnosis not present

## 2020-08-22 DIAGNOSIS — F32A Depression, unspecified: Secondary | ICD-10-CM | POA: Diagnosis not present

## 2020-08-22 DIAGNOSIS — G43909 Migraine, unspecified, not intractable, without status migrainosus: Secondary | ICD-10-CM | POA: Diagnosis not present

## 2020-08-22 DIAGNOSIS — I25119 Atherosclerotic heart disease of native coronary artery with unspecified angina pectoris: Secondary | ICD-10-CM | POA: Diagnosis not present

## 2020-08-22 DIAGNOSIS — Z95818 Presence of other cardiac implants and grafts: Secondary | ICD-10-CM | POA: Diagnosis not present

## 2020-08-22 DIAGNOSIS — Z7984 Long term (current) use of oral hypoglycemic drugs: Secondary | ICD-10-CM | POA: Diagnosis not present

## 2020-08-22 DIAGNOSIS — F419 Anxiety disorder, unspecified: Secondary | ICD-10-CM | POA: Diagnosis not present

## 2020-08-22 DIAGNOSIS — Z8673 Personal history of transient ischemic attack (TIA), and cerebral infarction without residual deficits: Secondary | ICD-10-CM | POA: Diagnosis not present

## 2020-08-22 DIAGNOSIS — E11610 Type 2 diabetes mellitus with diabetic neuropathic arthropathy: Secondary | ICD-10-CM | POA: Diagnosis not present

## 2020-08-22 DIAGNOSIS — N184 Chronic kidney disease, stage 4 (severe): Secondary | ICD-10-CM | POA: Diagnosis not present

## 2020-08-22 DIAGNOSIS — E1122 Type 2 diabetes mellitus with diabetic chronic kidney disease: Secondary | ICD-10-CM | POA: Diagnosis not present

## 2020-08-22 DIAGNOSIS — E1165 Type 2 diabetes mellitus with hyperglycemia: Secondary | ICD-10-CM | POA: Diagnosis not present

## 2020-08-22 DIAGNOSIS — K279 Peptic ulcer, site unspecified, unspecified as acute or chronic, without hemorrhage or perforation: Secondary | ICD-10-CM | POA: Diagnosis not present

## 2020-08-22 DIAGNOSIS — Z9981 Dependence on supplemental oxygen: Secondary | ICD-10-CM | POA: Diagnosis not present

## 2020-08-22 DIAGNOSIS — M1990 Unspecified osteoarthritis, unspecified site: Secondary | ICD-10-CM | POA: Diagnosis not present

## 2020-08-22 DIAGNOSIS — N390 Urinary tract infection, site not specified: Secondary | ICD-10-CM | POA: Diagnosis not present

## 2020-08-22 DIAGNOSIS — K219 Gastro-esophageal reflux disease without esophagitis: Secondary | ICD-10-CM | POA: Diagnosis not present

## 2020-08-27 DIAGNOSIS — E7801 Familial hypercholesterolemia: Secondary | ICD-10-CM | POA: Diagnosis not present

## 2020-08-27 DIAGNOSIS — E1165 Type 2 diabetes mellitus with hyperglycemia: Secondary | ICD-10-CM | POA: Diagnosis not present

## 2020-09-02 DIAGNOSIS — G609 Hereditary and idiopathic neuropathy, unspecified: Secondary | ICD-10-CM | POA: Diagnosis not present

## 2020-09-02 DIAGNOSIS — I10 Essential (primary) hypertension: Secondary | ICD-10-CM | POA: Diagnosis not present

## 2020-09-02 DIAGNOSIS — N189 Chronic kidney disease, unspecified: Secondary | ICD-10-CM | POA: Diagnosis not present

## 2020-09-02 DIAGNOSIS — H35371 Puckering of macula, right eye: Secondary | ICD-10-CM | POA: Diagnosis not present

## 2020-09-02 DIAGNOSIS — E119 Type 2 diabetes mellitus without complications: Secondary | ICD-10-CM | POA: Diagnosis not present

## 2020-09-02 DIAGNOSIS — E7800 Pure hypercholesterolemia, unspecified: Secondary | ICD-10-CM | POA: Diagnosis not present

## 2020-09-02 DIAGNOSIS — E1165 Type 2 diabetes mellitus with hyperglycemia: Secondary | ICD-10-CM | POA: Diagnosis not present

## 2020-09-03 ENCOUNTER — Other Ambulatory Visit: Payer: Self-pay | Admitting: *Deleted

## 2020-09-03 ENCOUNTER — Other Ambulatory Visit: Payer: Self-pay

## 2020-09-09 ENCOUNTER — Other Ambulatory Visit: Payer: Self-pay | Admitting: *Deleted

## 2020-09-10 ENCOUNTER — Other Ambulatory Visit: Payer: Self-pay | Admitting: *Deleted

## 2020-09-10 NOTE — Patient Outreach (Signed)
[facility]) Care Management  09/10/2020  [name] 1940-04-20 [id number]  Second outreach to follow up pt glucose control. Left a message and requested a return call. If I do not get a call I will send an unsuccessful letter.  [name]. [name], MSN, [facility] Gerontological Nurse Practitioner [facility] Care Management [phone]

## 2020-09-10 NOTE — Patient Outreach (Signed)
[place]) Care Management  09/09/20  [name] 25-Sep-1940 [id number]  Telephone outreach unsuccessful, called home, mobile and husband's mobile leaving messages on all but the latter and requesting a return call. Will call again another day this week.  [name]. [name], MSN, [facility] Gerontological Nurse Practitioner [facility] Care Management [phone]

## 2020-09-11 ENCOUNTER — Other Ambulatory Visit: Payer: Self-pay | Admitting: *Deleted

## 2020-09-12 ENCOUNTER — Other Ambulatory Visit: Payer: Self-pay | Admitting: *Deleted

## 2020-09-12 NOTE — Patient Outreach (Addendum)
[place] [facility]) Care Management  09/12/2020  [name] February 11, 1941 [id number]   Referral for medication assistance from Deloria Lair, NP sent to [facility] 09/17/20.  [name] [facility] Management Assistant [phone]

## 2020-09-12 NOTE — Patient Outreach (Addendum)
[facility]) Care Management  [place]  09/12/2020   Karletta Millay Demma 05-Sep-1940 [id number]  Subjective: Incoming call from Mr. Mara to report DM glucose readings.  Mr. And Mrs. Woodin and interested in reducing her medications and simplifying her regimen.  Encounter Medications:  Outpatient Encounter Medications as of 09/12/2020  Medication Sig Note   acetaminophen-codeine (TYLENOL #3) 300-30 MG tablet Take 1 tablet by mouth every 4 (four) hours as needed for moderate pain.    BIOTIN PO Take 1 tablet by mouth daily.    Blood Glucose Monitoring Suppl (FREESTYLE LITE) w/Device KIT use as directed    bumetanide (BUMEX) 2 MG tablet Take 2 mg by mouth 3 (three) times daily.    Calcium Carb-Cholecalciferol 600-800 MG-UNIT TABS Take 1 tablet by mouth daily.    Cholecalciferol (VITAMIN D) 50 MCG (2000 UT) tablet Take 2,000 Units by mouth daily.    clopidogrel (PLAVIX) 75 MG tablet Take 75 mg by mouth daily.    Coenzyme Q10 (COQ-10 PO) Take 1 tablet by mouth daily.    COLCRYS 0.6 MG tablet TAKE 1 TABLET DAILY AS NEEDED FOR GOUT FLARE.    Cranberry 500 MG TABS Take 500 mg by mouth every evening.    CRANBERRY-CALCIUM PO Take 1 tablet by mouth daily.    diazepam (VALIUM) 5 MG tablet Take 5 mg by mouth 3 (three) times daily as needed for anxiety.    exemestane (AROMASIN) 25 MG tablet TAKE 1 TABLET DAILY AFTER BREAKFAST (Patient taking differently: Take 25 mg by mouth daily after breakfast. TAKE 1 TABLET DAILY AFTER BREAKFAST)    febuxostat (ULORIC) 40 MG tablet Take 40 mg by mouth daily.    gabapentin (NEURONTIN) 300 MG capsule Take 300 mg by mouth 2 (two) times daily.     glipiZIDE (GLUCOTROL) 5 MG tablet Take 2.5 mg by mouth daily before breakfast. 07/30/2020: Has not started yet but will get this medication today and start.   glucose blood (FREESTYLE LITE) test strip Test blood sugars up to 4 times daily    insulin glargine (LANTUS) 100 UNIT/ML Solostar Pen Inject 5 Units  into the skin daily. 07/30/2020: Increased to 10 units last week by primary care.   Insulin Pen Needle (PENTIPS) 32G X 4 MM MISC Use as directed    Insulin Pen Needle 32G X 4 MM MISC 5 Units by Does not apply route in the morning and at bedtime.    insulin regular (NOVOLIN R) 100 units/mL injection Inject into the skin 3 (three) times daily before meals. Started last week 4/28: Sliding scale before meals: <150 none,                                                                           151-199 - 2 units  200-249 - 4 units                                                                            250-299 - 6 units                                                                            300-349 - 8 units                                                                            350> 10 units    JANUVIA 25 MG tablet Take 25 mg by mouth daily.    Krill Oil 500 MG CAPS Take 500 mg by mouth daily.    Lancets (FREESTYLE) lancets Test blood sugars up to 4 times daily    Methylfol-Methylcob-Acetylcyst (CEREFOLIN NAC) 6-2-600 MG TABS TAKE 1 TABLET BY MOUTH EVERY DAY    metolazone (ZAROXOLYN) 2.5 MG tablet Take 2.5 mg by mouth daily. 30 minutes before Bumetanide Mon. & Thurs.    metoprolol succinate (TOPROL-XL) 25 MG 24 hr tablet Take 25 mg by mouth at bedtime.     mirtazapine (REMERON) 15 MG tablet Take 15 mg by mouth at bedtime. Taking 1 1/2 tablet at bedtime    Multiple Vitamin (MULTIVITAMIN WITH MINERALS) TABS tablet Take 1 tablet by mouth daily.    nitroGLYCERIN (NITROLINGUAL) 0.4 MG/SPRAY spray Place 1 spray under the tongue every 5 (five) minutes x 3 doses as needed for chest pain.    pantoprazole (PROTONIX) 40 MG tablet Take 1 tablet (40 mg total) by mouth 2 (two) times daily before a meal.    potassium chloride SA (KLOR-CON) 20 MEQ tablet Take 20 mEq by mouth 2 (two) times daily.    pramipexole (MIRAPEX) 0.25 MG tablet  TAKE 1 TO 2 TABLETS AS INSTRUCTED, 1 TABLET AT 4:00 P.M. AND 2 TABLETS AT BEDTIME    RESVERATROL-GRAPE PO Take 500 mg by mouth.    rosuvastatin (CRESTOR) 20 MG tablet Take 20 mg by mouth daily.    No facility-administered encounter medications on file as of 09/12/2020.    Functional Status:  In your present state of health, do you have any difficulty performing the following activities: 07/22/2020 07/19/2020  Hearing? - N  Vision? - N  Difficulty concentrating or making decisions? - N  Walking or climbing stairs? - Y  Dressing or bathing? - N  Doing errands, shopping? - Y  [place] and eating ? N -  Using the Toilet? N -  In the past six months, have you accidently leaked urine? N -  Do you have problems with loss  of bowel control? N -  Managing your Medications? N -  Comment husband assists with med box filling weekly. -  Managing your Finances? N -  Housekeeping or managing your Housekeeping? Y -  Some recent data might be hidden    Fall/Depression Screening: Fall Risk  04/16/2020 04/04/2020 11/20/2019  Falls in the past year? $RemoveBe'1 1 1  'cfwaMFdRd$ Number falls in past yr: $Remove'1 1 1  'jblYGer$ Comment - - -  Injury with Fall? 1 1 0  Risk for fall due to : History of fall(s);Impaired balance/gait;Impaired mobility;Medication side effect;Orthopedic patient History of fall(s);Impaired balance/gait;Impaired mobility;Medication side effect;Orthopedic patient History of fall(s);Impaired balance/gait;Impaired mobility  Risk for fall due to: Comment - - -  Follow up Falls evaluation completed Falls evaluation completed;Education provided;Falls prevention discussed Falls evaluation completed   PHQ 2/9 Scores 08/06/2020 04/16/2020 06/19/2019 03/19/2019 01/16/2019 10/25/2018 07/24/2018  PHQ - 2 Score 3 1 0 0 0 0 0  PHQ- 9 Score 4 - - - - - -    Assessment: Improving glucose control                        Polypharmacy  CarePlan  Goals Addressed               This Visit's Progress     Patient  Stated     Will monitor glucose daily as ordered and follow protocol for elevated glucose levels as reported on each call over the next 90 days. (pt-stated)   On track      Barriers: Health Behaviors Knowledge Psychosocial Start date: 07/22/20 High Priority Long term Expected end date: 10/24/20  Next follow up: 10/24/20 Notes: 07/22/20 FBS today 731! Instructed to call PCP at once for instructions. Pt states she would do so. NP to follow closely. Current regimen needs critical update. Called MD office to ensure level was reported. 07/23/20 Called to find out pt FBS today, she took it while we were on the phone together and it was 449. She will see her NP tomorrow. Advised for her to take both her monitors to the office so all readings can be noted and appropriate orders can be given for her extremely high readings. Husband was part of the conversation today also. Pt is on antibiotic for UTI and is still symptomatic so no doubt this is playing a part in her hyperglycemia. 07/30/20 Talked with Mr. Brutus today, who expresses great frustration with getting his wife's newly prescribed GLIPIZIDE 5 mg 1/2 tab=2.5 mg. He has worked it out with MD office. Local pharmacy, [facility] and will get medication today and start it. He reports pt Lantus was increased to 10 units also once a day. She is still taking JARDIANCE also. She was also started on a SS Novalog. Mr. Masek is doing a good job trying to check her levels 3 times a day AC and at hs. Blood glucose levels are coming down mostly in the 200's. She had one at 128 in pm before evening meal. He reports he is checking as ordered but had not written down every value.  07/26/20 FBS:283, 11 am 253, 8:45 pm 321, 9:20 >400. 07/27/20 2 pm 341, 11:45 pm 243 07/28/20 4:40 am (alarm) 254, 5:45 Libre registered 335/fingerstick 296, 9:15 am 272    12:40 pm 2:48, 4:40 pm 128, 11:05 pm 287 07/29/20 10:15 am 194, 12N 188 07/30/20 8am 215, 9:22 am 186.  Primary care nurse to call  tomorrow for report. OV with  primary care provider, Cipriano Mile scheduled for 08/14/20.  Encouraged continuation of regimen and patience while dosages are being titrated to get her glucose levels at goal without hypoglycemia.  08/06/20 No readings over 400 this week. All but 2 readings 200-337. 2 readings normal at 112 and 146 which were NFBSs. Freestyle alarm goes off when glucose level is >300. PROBLEM: They have administered regular insulin at night when this happens per SS. NP voiced concern that SS insulin is usually only given at meal times and not during the night. Concern that alarm is set too low for high alarm. Concern that too many glucose checks is making the process more frustrating that it has to be.  Suggested routine meal times and lantus dosing time for optimal control.  Mr. Bordley will call primary office about giving regular insulin at night. Counseled that he is doing a great job but he can relax his mulitple checks within minutes. They do have diabetic nutrition counseling scheduled to start in June. 08/20/20 Progress being made although very slowly. Glucose checks 4X a day. No hypoglycemia this week. None in 300 range. 8 were <200, 12 were < 300. No regular insulin given after pm meal. Had decided to give Jardiance in and glipizide in pm, then read package inserts and reversed this schedule so glipizide is given in am and Jardiance in pm. Advised that glipizide is a medication that can cause hypoglycemia. Husband understands. 5/20: 160, 268, 148, 283, 223 5/21:  136, 261, 243, 216 5/22: 250, 247, 286, 177 5/23: 246, 112, 131, 259 5/24: 213, 174, 263 5/25: 185   09/12/20 Much improved glucose control with most recent medication adjustments! Sliding scale altered to start at 70-100 - 3 units, 101-150 - 4 units, 151-200 - 6 units, 201-250 - 8 units 251-300- 10 units, 301-350 10 and anything above 14 units. The last week she had 7 readings =>200-280 being highest. No hypoglycemic episodes.  All readings required SS coverage. Lowest readying was 77 at lunch time. Mr. Urschel is now giving Lantus on a better schedule between 10 -12 pm. They go to bed late. They are to be Dr. Suzette Battiest in July for follow up. Praised Mr. Armenteros for his efforts on getting on a more regular schedule. Advised he is doing a good job and it looks like they are on the right track.       Other     Protect My Health as evidenced by closing all care gaps by 06/26/20.   Not on track     Timeframe:  Long-Range Goal Priority:  High Start Date:       04/04/20                      Expected End Date:      06/26/20 , extended to 07/21/20, extending an additional 3 months to 10/25/20.            Follow Up Date  10/25/20- schedule recommended health tests (blood work, mammogram, colonoscopy, pap test) - schedule and keep appointment for annual check-up    Notes: 04/16/20 Had to cancel an appt last week, did not feel well. She does have it rescheduled. This is not an issue for her. Reviewed vacs which are up to date. Eye exam and dental exams are due. Pt to make appts. 04/30/20 Pt did not make appts. Encouraged that doing these self maintenance tasks are part of Medicare required tasks for members. They are important to avoid complications.  She did have Hgb A1C in November which was high 8.8. Today she reports her FBS and NFBS are <160. NP called primary to verify if she has had pneumonia, flu and Td vaccines. Verified her 3 Moderna Covid vaccines today. 06/19/20 Pt has not seen her primary and acknowledges she needs to do so. Gave her a list of things that she needs help from [name] to complete: foot exam, HgbA1C, pap, urine for protein, mammogram, and possible colonoscopy. She also needs a diabetic eye exam. Had pt write up this list so she can check everything off by the time we talk again in one month. 07/22/20 Pt just hospitalized for CP but no cardiac event. Has UTI and hyperglycemia. Will see new primary care provider on Thursday. FBS today  731!!! Advised to call primary office ASAP for instructions. 07/30/20 Husband working with primary office and wife to adjust antidiabetic meds to get pt to goals. Will be referred to a dietician per Mr. Medellin. Getting her glucose to goal is priority at this time and then will encourage gap closures. 08/06/20 Continues to concentrate on glucose monitoring and insulin regimen for improved control. Will see primary care provider next week for follow up. 08/17/21 No progress  towards gap closures due to focus on glucose control. Saw pt's primary care this week and she has advised that pt go back to endocrinology. Pt reports her glucose levels are still way off. Husband is to call me back with her readings. Mr. Prestia called back 5/25 with results. Values are coming down, no hypoglycemia. 12 readings in the 200's, None in 300's, 8 readings under 200. 09/12/20 Husband reports they are still working on getting her glucose regimen turned up. So care gaps are still on hold.         Plan: Making referral to [facility] for med list simplification suggestions.  We agreed to talk in 2 weeks. Follow-up: Patient agrees to Care Plan and Follow-up. Follow-up in 2 week(s)  Kayleen Memos C. [name], MSN, [facility] Gerontological Nurse Practitioner [facility] Care Management [phone]

## 2020-09-18 ENCOUNTER — Telehealth: Payer: Self-pay | Admitting: Pharmacist

## 2020-09-18 NOTE — Progress Notes (Signed)
[name] [facility])  [place] Team    09/18/2020  [name] [name] [birth year] [id number]  Reason for referral: Medication Review, deprescribing opportunities  Referral source:  [facility] NP [name] Current insurance: Unknown  PMHx includes but not limited to:  CAD s/p PCI 2007, HFpEF (EF 60-65% 11/'21), HTN, HLD, CKD-IV, HA, anxiety, mild cognitive impairment, GERD, PUD, GIB, gout, RLS, T2DM  Outreach:  Successful telephone call with [name]'s spouse.  HIPAA identifiers verified.  Spouse declines medication review with [name] pharmacist.  He states he has multiple providers that he is in contact with regarding patient and does not see the need to have another healthcare provider involved.     Objective: The ASCVD Risk score Kristen Bussing DC Jr., et al., 2013) failed to calculate for the following reasons:   The 2013 ASCVD risk score is only valid for ages 71 to 4  Lab Results  Component Value Date   CREATININE 1.35 (H) 07/21/2020   CREATININE 1.38 (H) 07/20/2020   CREATININE 1.69 (H) 07/19/2020    Lab Results  Component Value Date   HGBA1C 12.8 (H) 07/19/2020    Lipid Panel     Component Value Date/Time   CHOL 148 02/08/2020 0754   TRIG 193 (H) 02/08/2020 0754   HDL 36 (L) 02/08/2020 0754   CHOLHDL 4.1 02/08/2020 0754   VLDL 39 02/08/2020 0754   LDLCALC 73 02/08/2020 0754    BP Readings from Last 3 Encounters:  07/21/20 125/79  03/02/20 (!) 166/78  02/17/20 (!) 92/47    Allergies  Allergen Reactions   Nsaids Nausea And Vomiting and Palpitations   Reglan [Metoclopramide] Other (See Comments)    Seizures    Tramadol Other (See Comments)    Seizures    Ultram [Tramadol] Other (See Comments)    Pt has seizure activity with this medication   Lipitor [Atorvastatin] Other (See Comments)    Bone and muscle pain   Lipitor [Atorvastatin] Swelling and Other (See Comments)    Pain    Lyrica [Pregabalin] Other (See Comments)    Weight gain, extremity  swelling   Lyrica [Pregabalin] Swelling   Reglan [Metoclopramide] Other (See Comments)    Tardive dyskinesia; paradoxical reaction not relieved by benadryl   Nsaids Swelling    SWELLING REACTION UNSPECIFIED    Urecholine [Bethanechol]     Palpitations and nausea    Medications Reviewed Today     Reviewed by Kristen Lair, NP (Nurse Practitioner) on 08/06/20 at 1000  Med List Status: <None>   Medication Order Taking? Sig Documenting Provider Last Dose Status Informant  acetaminophen-codeine (TYLENOL #3) 300-30 MG tablet [id number] No Take 1 tablet by mouth every 4 (four) hours as needed for moderate pain. [provider] Taking Active Spouse/Significant Other  BIOTIN PO [id number] No Take 1 tablet by mouth daily. [provider] Taking Active Spouse/Significant Other  Blood Glucose Monitoring Suppl (FREESTYLE LITE) w/Device KIT [id number] No use as directed Kristen Memos, DO Taking Active   bumetanide (BUMEX) 2 MG tablet [id number] No Take 2 mg by mouth 3 (three) times daily. [provider] Taking Active Spouse/Significant Other  Calcium Carb-Cholecalciferol 600-800 MG-UNIT TABS [id number] No Take 1 tablet by mouth daily. [provider] Taking Active Spouse/Significant Other  Cholecalciferol (VITAMIN D) 50 MCG (2000 UT) tablet [id number] No Take 2,000 Units by mouth daily. [provider] Taking Active Spouse/Significant Other  clopidogrel (PLAVIX) 75 MG tablet [id number] No Take 75 mg by mouth daily. [provider]  Taking Active Spouse/Significant Other  Coenzyme Q10 (COQ-10 PO) [id number] No Take 1 tablet by mouth daily. [provider] Taking Active Spouse/Significant Other  COLCRYS 0.6 MG tablet [id number] No TAKE 1 TABLET DAILY AS NEEDED FOR GOUT FLARE. Kristen Minion, MD Taking Active Spouse/Significant Other  Cranberry 500 MG TABS [id number] No Take 500 mg by mouth every evening. [provider] Taking Active  Spouse/Significant Other  CRANBERRY-CALCIUM PO [id number] No Take 1 tablet by mouth daily. [provider] Not Taking Active Spouse/Significant Other  diazepam (VALIUM) 5 MG tablet [id number] No Take 5 mg by mouth 3 (three) times daily as needed for anxiety. [provider] Taking Active Spouse/Significant Other  exemestane (AROMASIN) 25 MG tablet [id number] No TAKE 1 TABLET DAILY AFTER BREAKFAST  Patient taking differently: Take 25 mg by mouth daily after breakfast. TAKE 1 TABLET DAILY AFTER Kristen Bar, MD Taking Active   febuxostat (ULORIC) 40 MG tablet [id number] No Take 40 mg by mouth daily. [provider] Taking Active Spouse/Significant Other  gabapentin (NEURONTIN) 300 MG capsule [id number] No Take 300 mg by mouth 2 (two) times daily.  [provider] Taking Active Spouse/Significant Other           Med Note [name]   Sat Jul 19, 2020  1:33 AM)    glipiZIDE (GLUCOTROL) 5 MG tablet [id number]  Take 2.5 mg by mouth daily before breakfast. [provider]  Active Spouse/Significant Other           Med Note [name]   Wed Jul 30, 2020 11:25 AM) Has not started yet but will get this medication today and start.  glucose blood (FREESTYLE LITE) test strip [id number] No Test blood sugars up to 4 times daily Kristen Memos, DO Taking Active   insulin glargine (LANTUS) 100 UNIT/ML Solostar Pen [id number] No Inject 5 Units into the skin daily. Kristen Memos, DO Taking Active            Med Note [name]   Wed Jul 30, 2020 11:17 AM) Increased to 10 units last week by primary care.  Insulin Pen Needle (PENTIPS) 32G X 4 MM MISC [id number]  Use as directed Kristen Memos, DO  Active   Insulin Pen Needle 32G X 4 MM MISC [id number]  5 Units by Does not apply route in the morning and at bedtime. Kristen Memos, DO  Active   insulin regular (NOVOLIN R) 100 units/mL injection [id number] No Inject into the skin 3 (three) times daily before  meals. Started last week 4/28: Sliding scale before meals: <150 none,                                                                           151-199 - 2 units  200-249 - 4 units                                                                            250-299 - 6 units                                                                            300-349 - 8 units                                                                            350> 10 units [provider] Taking Active Spouse/Significant Other  JANUVIA 25 MG tablet [id number] No Take 25 mg by mouth daily. [provider] Taking Active Spouse/Significant Other  Krill Oil 500 MG CAPS [id number] No Take 500 mg by mouth daily. [provider] Taking Active Spouse/Significant Other  Lancets (FREESTYLE) lancets [id number]  Test blood sugars up to 4 times daily Kristen Memos, DO  Active   Methylfol-Methylcob-Acetylcyst (CEREFOLIN NAC) 6-2-600 MG TABS [id number] No TAKE 1 TABLET BY MOUTH EVERY DAY Garvin Fila, MD Taking Active Spouse/Significant Other  metolazone (ZAROXOLYN) 2.5 MG tablet [id number] No Take 2.5 mg by mouth daily. 30 minutes before Bumetanide Mon. & Thurs. [provider] Taking Active Spouse/Significant Other  metoprolol succinate (TOPROL-XL) 25 MG 24 hr tablet [id number] No Take 25 mg by mouth at bedtime.  [provider] Taking Active Spouse/Significant Other  mirtazapine (REMERON) 15 MG tablet [id number] No Take 15 mg by mouth at bedtime. Taking 1 1/2 tablet at bedtime [provider] Taking Active Spouse/Significant Other           Med Note [name]   Sat Jul 19, 2020  1:33 AM)    Multiple Vitamin (MULTIVITAMIN WITH MINERALS) TABS tablet [id number] No Take 1 tablet by mouth daily. Terrilee Croak, MD Taking Active Spouse/Significant Other  nitroGLYCERIN (NITROLINGUAL) 0.4 MG/SPRAY spray [id number]  No Place 1 spray under the tongue every 5 (five) minutes x 3 doses as needed for chest pain. [provider] Taking Active Spouse/Significant Other           Med Note [name]   Sat Jul 19, 2020  1:19 AM)    pantoprazole (PROTONIX) 40 MG tablet [id number] No Take 1 tablet (40 mg total) by mouth 2 (two) times daily before a meal. Terrilee Croak, MD Taking Active Spouse/Significant Other           Med Note [name]   Sat Jul 19, 2020  1:21 AM)    potassium chloride SA (KLOR-CON) 20 MEQ tablet [id number] No Take 20 mEq by mouth 2 (two) times daily. [provider] Taking  Active Spouse/Significant Other  pramipexole (MIRAPEX) 0.25 MG tablet [id number] No TAKE 1 TO 2 TABLETS AS INSTRUCTED, 1 TABLET AT 4:00 P.M. AND 2 TABLETS AT BEDTIME Frann Rider, NP Taking Active Spouse/Significant Other           Med Note [name]   Sat Jul 19, 2020  1:33 AM)    RESVERATROL-GRAPE PO [id number] No Take 500 mg by mouth. [provider] Taking Active Spouse/Significant Other  rosuvastatin (CRESTOR) 20 MG tablet [id number] No Take 20 mg by mouth daily. [provider] Taking Active Spouse/Significant Other            Assessment: Unable to review medications with spouse or patient.  Per note from NP [name], patient and spouse were interested in stopping medications that may not be necessary.  Per medication list in [name], there may be opportunities for discussion with provider if all supplements are clinically warranted as patient taking several different OTC medications including biotin, cranberry, CoQ10, Krill oil, reservatrol, and cerefolin NAC.  Unable to discuss indication nor length of therapy with patient to assess nor if this list is accurate and up-to-date.     Plan: Spoke with NP Monna Fam and provided update on patient declining medication review Will close patient case at this time but am happy to assist in the future if patient and  spouse interested in engaging with [place], PharmD, [place] [phone]

## 2020-09-25 ENCOUNTER — Encounter: Payer: Medicare Other | Admitting: Dietician

## 2020-09-25 ENCOUNTER — Other Ambulatory Visit: Payer: Self-pay | Admitting: *Deleted

## 2020-09-25 NOTE — Patient Outreach (Signed)
[facility] Care Management  09/25/2020  Neena Beecham Badertscher [birth year] [id number]  Unsuccessful outreach for f/u DM. Left a message on the home and cell phone to return NP call. If no return call, will try again in 2 weeks.  [name]. [name], MSN, [facility] Gerontological Nurse Practitioner [facility] Care Management [phone]

## 2020-10-08 DIAGNOSIS — I5032 Chronic diastolic (congestive) heart failure: Secondary | ICD-10-CM | POA: Diagnosis not present

## 2020-10-08 DIAGNOSIS — M799 Soft tissue disorder, unspecified: Secondary | ICD-10-CM | POA: Diagnosis not present

## 2020-10-08 DIAGNOSIS — I130 Hypertensive heart and chronic kidney disease with heart failure and stage 1 through stage 4 chronic kidney disease, or unspecified chronic kidney disease: Secondary | ICD-10-CM | POA: Diagnosis not present

## 2020-10-08 DIAGNOSIS — Z0000 Encounter for general adult medical examination without abnormal findings: Secondary | ICD-10-CM | POA: Diagnosis not present

## 2020-10-08 DIAGNOSIS — Z7189 Other specified counseling: Secondary | ICD-10-CM | POA: Diagnosis not present

## 2020-10-08 DIAGNOSIS — Z79899 Other long term (current) drug therapy: Secondary | ICD-10-CM | POA: Diagnosis not present

## 2020-10-08 DIAGNOSIS — R600 Localized edema: Secondary | ICD-10-CM | POA: Diagnosis not present

## 2020-10-08 DIAGNOSIS — N184 Chronic kidney disease, stage 4 (severe): Secondary | ICD-10-CM | POA: Diagnosis not present

## 2020-10-08 DIAGNOSIS — N1831 Chronic kidney disease, stage 3a: Secondary | ICD-10-CM | POA: Diagnosis not present

## 2020-10-08 DIAGNOSIS — I10 Essential (primary) hypertension: Secondary | ICD-10-CM | POA: Diagnosis not present

## 2020-10-08 DIAGNOSIS — E1122 Type 2 diabetes mellitus with diabetic chronic kidney disease: Secondary | ICD-10-CM | POA: Diagnosis not present

## 2020-10-08 DIAGNOSIS — N1832 Chronic kidney disease, stage 3b: Secondary | ICD-10-CM | POA: Diagnosis not present

## 2020-10-08 DIAGNOSIS — E663 Overweight: Secondary | ICD-10-CM | POA: Diagnosis not present

## 2020-10-24 DIAGNOSIS — N1832 Chronic kidney disease, stage 3b: Secondary | ICD-10-CM | POA: Diagnosis not present

## 2020-10-27 ENCOUNTER — Other Ambulatory Visit: Payer: Self-pay | Admitting: *Deleted

## 2020-10-28 DIAGNOSIS — N2581 Secondary hyperparathyroidism of renal origin: Secondary | ICD-10-CM | POA: Diagnosis not present

## 2020-10-28 DIAGNOSIS — N1832 Chronic kidney disease, stage 3b: Secondary | ICD-10-CM | POA: Diagnosis not present

## 2020-10-28 DIAGNOSIS — I129 Hypertensive chronic kidney disease with stage 1 through stage 4 chronic kidney disease, or unspecified chronic kidney disease: Secondary | ICD-10-CM | POA: Diagnosis not present

## 2020-10-28 DIAGNOSIS — D631 Anemia in chronic kidney disease: Secondary | ICD-10-CM | POA: Diagnosis not present

## 2020-10-28 DIAGNOSIS — R300 Dysuria: Secondary | ICD-10-CM | POA: Diagnosis not present

## 2020-12-09 ENCOUNTER — Other Ambulatory Visit: Payer: Self-pay | Admitting: Hematology

## 2020-12-09 DIAGNOSIS — Z1231 Encounter for screening mammogram for malignant neoplasm of breast: Secondary | ICD-10-CM

## 2020-12-31 DIAGNOSIS — C44629 Squamous cell carcinoma of skin of left upper limb, including shoulder: Secondary | ICD-10-CM | POA: Diagnosis not present

## 2020-12-31 DIAGNOSIS — C44729 Squamous cell carcinoma of skin of left lower limb, including hip: Secondary | ICD-10-CM | POA: Diagnosis not present

## 2021-01-12 ENCOUNTER — Ambulatory Visit: Payer: Medicare Other

## 2021-01-28 DIAGNOSIS — L570 Actinic keratosis: Secondary | ICD-10-CM | POA: Diagnosis not present

## 2021-01-28 DIAGNOSIS — Z1283 Encounter for screening for malignant neoplasm of skin: Secondary | ICD-10-CM | POA: Diagnosis not present

## 2021-01-28 DIAGNOSIS — Z85820 Personal history of malignant melanoma of skin: Secondary | ICD-10-CM | POA: Diagnosis not present

## 2021-01-28 DIAGNOSIS — Z85828 Personal history of other malignant neoplasm of skin: Secondary | ICD-10-CM | POA: Diagnosis not present

## 2021-01-28 DIAGNOSIS — L820 Inflamed seborrheic keratosis: Secondary | ICD-10-CM | POA: Diagnosis not present

## 2021-01-28 DIAGNOSIS — X32XXXA Exposure to sunlight, initial encounter: Secondary | ICD-10-CM | POA: Diagnosis not present

## 2021-01-28 DIAGNOSIS — Z08 Encounter for follow-up examination after completed treatment for malignant neoplasm: Secondary | ICD-10-CM | POA: Diagnosis not present

## 2021-02-05 ENCOUNTER — Other Ambulatory Visit: Payer: Self-pay | Admitting: Orthopedic Surgery

## 2021-03-10 ENCOUNTER — Other Ambulatory Visit: Payer: Self-pay | Admitting: *Deleted

## 2021-03-10 NOTE — Patient Outreach (Signed)
[facility]) Care Management  03/10/2021  Marcelina Mclaurin Mclennan 08-31-1940 [id number]  Telephone outreach. Talked with Mr. Feltner who gives an update on [name]'s condition over the last several months.  SUBJECTIVE: He reports that he feel he has everything very well managed although he does report Mrs. Chirico's main problem at present is her balance. She does have a hx of diabetes and Charcot foot. At one time she did have some diabetic shoes with LFO's which caused more of a problem the same day. She fell at a restaurant as she wasn't accustomed to the shoes and braces and sustain a major laceration requiring an ED visit and 18 staples in her arm. Mr. Iribe expressed his extreme dissatisfaction with that experience.  He reports she hasn't been to a podiatrist in some time to evaluate her Charcot foot or have a diabetic foot exam.   He says that one of her meds makes her drowsy. She is taking valium 5 mg 2-3 times a day which he says is not near enough. Also neurontin 300 mg bid.   He says her glucose levels are still not ideal but have improved somewhat. She is still going to the endocrinologist.  He reports her memory has declined further.  ASSESSMENT: DM fair control                           Charcot foot      Cognitive Impairment                            Medication side effect - drowsiness                              PLAN: Advised that Mrs. Kartes does need to go to the podiatrist for a foot evaluation. Also they will need a new RX for diabetic shoes which should be formed to support her charcot deformity.  Also, NP notes that pt does not have any dementia meds cholinesterase inhibitor or NMDA receptor agonist (she does take some supplements). These may have been attempted in the past. It may be a consideration to order a PET and completing a SAGE screen in the office.  Reviewed meds and advised Mr. Hosack that valium and gabapentin are the 2 meds she takes that could cause the  drowsiness.   NP will let Ms. Stowe know about these needs.  Closing pt case as Mr. Avery is managing her care VERY WELL.  [name]. [name], MSN, [facility] Gerontological Nurse Practitioner [facility] Care Management [phone]

## 2021-03-13 DIAGNOSIS — N390 Urinary tract infection, site not specified: Secondary | ICD-10-CM | POA: Diagnosis not present

## 2021-03-13 DIAGNOSIS — I129 Hypertensive chronic kidney disease with stage 1 through stage 4 chronic kidney disease, or unspecified chronic kidney disease: Secondary | ICD-10-CM | POA: Diagnosis not present

## 2021-03-13 DIAGNOSIS — N2581 Secondary hyperparathyroidism of renal origin: Secondary | ICD-10-CM | POA: Diagnosis not present

## 2021-03-13 DIAGNOSIS — N189 Chronic kidney disease, unspecified: Secondary | ICD-10-CM | POA: Diagnosis not present

## 2021-03-13 DIAGNOSIS — N1832 Chronic kidney disease, stage 3b: Secondary | ICD-10-CM | POA: Diagnosis not present

## 2021-03-13 DIAGNOSIS — D631 Anemia in chronic kidney disease: Secondary | ICD-10-CM | POA: Diagnosis not present

## 2021-03-30 ENCOUNTER — Other Ambulatory Visit: Payer: Self-pay

## 2021-03-30 ENCOUNTER — Encounter (HOSPITAL_COMMUNITY): Payer: Self-pay | Admitting: Emergency Medicine

## 2021-03-30 ENCOUNTER — Emergency Department (HOSPITAL_COMMUNITY)
Admission: EM | Admit: 2021-03-30 | Discharge: 2021-03-31 | Discharge status: Against Medical Advice | Payer: Medicare Other | Attending: Emergency Medicine | Admitting: Emergency Medicine

## 2021-03-30 DIAGNOSIS — I82432 Acute embolism and thrombosis of left popliteal vein: Secondary | ICD-10-CM | POA: Diagnosis not present

## 2021-03-30 DIAGNOSIS — M7989 Other specified soft tissue disorders: Secondary | ICD-10-CM | POA: Diagnosis not present

## 2021-03-30 DIAGNOSIS — S91332A Puncture wound without foreign body, left foot, initial encounter: Secondary | ICD-10-CM | POA: Diagnosis not present

## 2021-03-30 DIAGNOSIS — I82492 Acute embolism and thrombosis of other specified deep vein of left lower extremity: Secondary | ICD-10-CM | POA: Diagnosis not present

## 2021-03-30 DIAGNOSIS — X58XXXA Exposure to other specified factors, initial encounter: Secondary | ICD-10-CM | POA: Diagnosis not present

## 2021-03-30 DIAGNOSIS — L089 Local infection of the skin and subcutaneous tissue, unspecified: Secondary | ICD-10-CM | POA: Diagnosis not present

## 2021-03-30 DIAGNOSIS — E11628 Type 2 diabetes mellitus with other skin complications: Secondary | ICD-10-CM | POA: Diagnosis not present

## 2021-03-30 DIAGNOSIS — I82812 Embolism and thrombosis of superficial veins of left lower extremities: Secondary | ICD-10-CM | POA: Diagnosis not present

## 2021-03-30 DIAGNOSIS — E1122 Type 2 diabetes mellitus with diabetic chronic kidney disease: Secondary | ICD-10-CM | POA: Insufficient documentation

## 2021-03-30 DIAGNOSIS — S91301A Unspecified open wound, right foot, initial encounter: Secondary | ICD-10-CM | POA: Insufficient documentation

## 2021-03-30 DIAGNOSIS — N189 Chronic kidney disease, unspecified: Secondary | ICD-10-CM | POA: Insufficient documentation

## 2021-03-30 DIAGNOSIS — S91331A Puncture wound without foreign body, right foot, initial encounter: Secondary | ICD-10-CM | POA: Diagnosis not present

## 2021-03-30 DIAGNOSIS — Z5321 Procedure and treatment not carried out due to patient leaving prior to being seen by health care provider: Secondary | ICD-10-CM | POA: Diagnosis not present

## 2021-03-30 DIAGNOSIS — R600 Localized edema: Secondary | ICD-10-CM | POA: Diagnosis not present

## 2021-03-30 DIAGNOSIS — S99921A Unspecified injury of right foot, initial encounter: Secondary | ICD-10-CM | POA: Diagnosis present

## 2021-03-30 LAB — CBC WITH DIFFERENTIAL/PLATELET
Abs Immature Granulocytes: 0.04 10*3/uL (ref 0.00–0.07)
Basophils Absolute: 0.1 10*3/uL (ref 0.0–0.1)
Basophils Relative: 1 %
Eosinophils Absolute: 0.2 10*3/uL (ref 0.0–0.5)
Eosinophils Relative: 2 %
HCT: 41.4 % (ref 36.0–46.0)
Hemoglobin: 13.2 g/dL (ref 12.0–15.0)
Immature Granulocytes: 0 %
Lymphocytes Relative: 21 %
Lymphs Abs: 1.9 10*3/uL (ref 0.7–4.0)
MCH: 29.2 pg (ref 26.0–34.0)
MCHC: 31.9 g/dL (ref 30.0–36.0)
MCV: 91.6 fL (ref 80.0–100.0)
Monocytes Absolute: 0.6 10*3/uL (ref 0.1–1.0)
Monocytes Relative: 7 %
Neutro Abs: 6.2 10*3/uL (ref 1.7–7.7)
Neutrophils Relative %: 69 %
Platelets: 207 10*3/uL (ref 150–400)
RBC: 4.52 MIL/uL (ref 3.87–5.11)
RDW: 14.3 % (ref 11.5–15.5)
WBC: 9 10*3/uL (ref 4.0–10.5)
nRBC: 0 % (ref 0.0–0.2)

## 2021-03-30 LAB — COMPREHENSIVE METABOLIC PANEL
ALT: 18 U/L (ref 0–44)
AST: 25 U/L (ref 15–41)
Albumin: 3.2 g/dL — ABNORMAL LOW (ref 3.5–5.0)
Alkaline Phosphatase: 82 U/L (ref 38–126)
Anion gap: 10 (ref 5–15)
BUN: 12 mg/dL (ref 8–23)
CO2: 31 mmol/L (ref 22–32)
Calcium: 9.7 mg/dL (ref 8.9–10.3)
Chloride: 96 mmol/L — ABNORMAL LOW (ref 98–111)
Creatinine, Ser: 1.66 mg/dL — ABNORMAL HIGH (ref 0.44–1.00)
GFR, Estimated: 31 mL/min — ABNORMAL LOW (ref >60)
Glucose, Bld: 215 mg/dL — ABNORMAL HIGH (ref 70–99)
Potassium: 3.4 mmol/L — ABNORMAL LOW (ref 3.5–5.1)
Sodium: 137 mmol/L (ref 135–145)
Total Bilirubin: 0.8 mg/dL (ref 0.3–1.2)
Total Protein: 6.9 g/dL (ref 6.5–8.1)

## 2021-03-30 LAB — LACTIC ACID, PLASMA: Lactic Acid, Venous: 1.4 mmol/L (ref 0.5–1.9)

## 2021-03-30 NOTE — ED Provider Notes (Signed)
[facility] Provider Triage Evaluation Note  [name] , a 81 y.o. female  was evaluated in triage.  Pt complains of a wound that opened up with associated streaking redness up the bilateral legs.  Patient does have a history of diabetes and CKD.  No fever, chills, chest pain, shortness of breath.  Review of Systems  Positive:  Negative: See above   Physical Exam  BP 119/78    Pulse 79    Temp 97.6 F (36.4 C) (Oral)    Resp 16    SpO2 98%  Gen:   Awake, no distress   Resp:  Normal effort  MSK:   Moves extremities without difficulty  Other:  4cm open wound to the medial aspect of the right foot with surrounding streaking erythema proximal up the leg.   Medical Decision Making  Medically screening exam initiated at 6:03 PM.  Appropriate orders placed.  [name] was informed that the remainder of the evaluation will be completed by another provider, this initial triage assessment does not replace that evaluation, and the importance of remaining in the ED until their evaluation is complete.     Hendricks Limes, PA-C 03/30/21 1809    Jeanell Sparrow, DO 03/31/21 0013

## 2021-03-30 NOTE — ED Triage Notes (Signed)
Pt presents with redness and swelling to bilateral legs. New open wound to right foot. Hx diabetes and CKD. Denies fevers.

## 2021-03-31 DIAGNOSIS — G629 Polyneuropathy, unspecified: Secondary | ICD-10-CM | POA: Diagnosis not present

## 2021-03-31 DIAGNOSIS — Z5321 Procedure and treatment not carried out due to patient leaving prior to being seen by health care provider: Secondary | ICD-10-CM | POA: Diagnosis not present

## 2021-03-31 DIAGNOSIS — N189 Chronic kidney disease, unspecified: Secondary | ICD-10-CM | POA: Diagnosis not present

## 2021-03-31 DIAGNOSIS — M86071 Acute hematogenous osteomyelitis, right ankle and foot: Secondary | ICD-10-CM | POA: Diagnosis not present

## 2021-03-31 DIAGNOSIS — M14671 Charcot's joint, right ankle and foot: Secondary | ICD-10-CM | POA: Diagnosis not present

## 2021-03-31 DIAGNOSIS — S91301A Unspecified open wound, right foot, initial encounter: Secondary | ICD-10-CM | POA: Diagnosis not present

## 2021-03-31 DIAGNOSIS — I509 Heart failure, unspecified: Secondary | ICD-10-CM | POA: Diagnosis not present

## 2021-03-31 DIAGNOSIS — S80821A Blister (nonthermal), right lower leg, initial encounter: Secondary | ICD-10-CM | POA: Diagnosis not present

## 2021-03-31 DIAGNOSIS — M19012 Primary osteoarthritis, left shoulder: Secondary | ICD-10-CM | POA: Diagnosis not present

## 2021-03-31 DIAGNOSIS — Z853 Personal history of malignant neoplasm of breast: Secondary | ICD-10-CM | POA: Diagnosis not present

## 2021-03-31 DIAGNOSIS — L97509 Non-pressure chronic ulcer of other part of unspecified foot with unspecified severity: Secondary | ICD-10-CM | POA: Diagnosis not present

## 2021-03-31 DIAGNOSIS — I890 Lymphedema, not elsewhere classified: Secondary | ICD-10-CM | POA: Diagnosis not present

## 2021-03-31 DIAGNOSIS — L03115 Cellulitis of right lower limb: Secondary | ICD-10-CM | POA: Diagnosis not present

## 2021-03-31 DIAGNOSIS — R600 Localized edema: Secondary | ICD-10-CM | POA: Diagnosis not present

## 2021-03-31 DIAGNOSIS — L97519 Non-pressure chronic ulcer of other part of right foot with unspecified severity: Secondary | ICD-10-CM | POA: Diagnosis not present

## 2021-03-31 DIAGNOSIS — I130 Hypertensive heart and chronic kidney disease with heart failure and stage 1 through stage 4 chronic kidney disease, or unspecified chronic kidney disease: Secondary | ICD-10-CM | POA: Diagnosis not present

## 2021-03-31 DIAGNOSIS — E11621 Type 2 diabetes mellitus with foot ulcer: Secondary | ICD-10-CM | POA: Diagnosis not present

## 2021-03-31 DIAGNOSIS — E1169 Type 2 diabetes mellitus with other specified complication: Secondary | ICD-10-CM | POA: Diagnosis not present

## 2021-03-31 DIAGNOSIS — S90821A Blister (nonthermal), right foot, initial encounter: Secondary | ICD-10-CM | POA: Diagnosis not present

## 2021-03-31 DIAGNOSIS — E11628 Type 2 diabetes mellitus with other skin complications: Secondary | ICD-10-CM | POA: Diagnosis not present

## 2021-03-31 DIAGNOSIS — I82492 Acute embolism and thrombosis of other specified deep vein of left lower extremity: Secondary | ICD-10-CM | POA: Diagnosis not present

## 2021-03-31 DIAGNOSIS — Z20822 Contact with and (suspected) exposure to covid-19: Secondary | ICD-10-CM | POA: Diagnosis not present

## 2021-03-31 DIAGNOSIS — I82432 Acute embolism and thrombosis of left popliteal vein: Secondary | ICD-10-CM | POA: Diagnosis not present

## 2021-03-31 DIAGNOSIS — Z8673 Personal history of transient ischemic attack (TIA), and cerebral infarction without residual deficits: Secondary | ICD-10-CM | POA: Diagnosis not present

## 2021-03-31 DIAGNOSIS — E11610 Type 2 diabetes mellitus with diabetic neuropathic arthropathy: Secondary | ICD-10-CM | POA: Diagnosis not present

## 2021-03-31 DIAGNOSIS — E1140 Type 2 diabetes mellitus with diabetic neuropathy, unspecified: Secondary | ICD-10-CM | POA: Diagnosis not present

## 2021-03-31 DIAGNOSIS — Z5189 Encounter for other specified aftercare: Secondary | ICD-10-CM | POA: Diagnosis not present

## 2021-03-31 DIAGNOSIS — L97411 Non-pressure chronic ulcer of right heel and midfoot limited to breakdown of skin: Secondary | ICD-10-CM | POA: Diagnosis not present

## 2021-03-31 DIAGNOSIS — G8929 Other chronic pain: Secondary | ICD-10-CM | POA: Diagnosis not present

## 2021-03-31 DIAGNOSIS — Z7984 Long term (current) use of oral hypoglycemic drugs: Secondary | ICD-10-CM | POA: Diagnosis not present

## 2021-03-31 DIAGNOSIS — F0390 Unspecified dementia without behavioral disturbance: Secondary | ICD-10-CM | POA: Diagnosis not present

## 2021-03-31 DIAGNOSIS — I5032 Chronic diastolic (congestive) heart failure: Secondary | ICD-10-CM | POA: Diagnosis not present

## 2021-03-31 DIAGNOSIS — I10 Essential (primary) hypertension: Secondary | ICD-10-CM | POA: Diagnosis not present

## 2021-03-31 DIAGNOSIS — Z794 Long term (current) use of insulin: Secondary | ICD-10-CM | POA: Diagnosis not present

## 2021-03-31 DIAGNOSIS — M7989 Other specified soft tissue disorders: Secondary | ICD-10-CM | POA: Diagnosis not present

## 2021-03-31 DIAGNOSIS — Z967 Presence of other bone and tendon implants: Secondary | ICD-10-CM | POA: Diagnosis not present

## 2021-03-31 DIAGNOSIS — F03911 Unspecified dementia, unspecified severity, with agitation: Secondary | ICD-10-CM | POA: Diagnosis not present

## 2021-03-31 DIAGNOSIS — Z9181 History of falling: Secondary | ICD-10-CM | POA: Diagnosis not present

## 2021-03-31 DIAGNOSIS — L97529 Non-pressure chronic ulcer of other part of left foot with unspecified severity: Secondary | ICD-10-CM | POA: Diagnosis not present

## 2021-03-31 DIAGNOSIS — L089 Local infection of the skin and subcutaneous tissue, unspecified: Secondary | ICD-10-CM | POA: Diagnosis not present

## 2021-03-31 DIAGNOSIS — G2581 Restless legs syndrome: Secondary | ICD-10-CM | POA: Diagnosis not present

## 2021-03-31 DIAGNOSIS — N183 Chronic kidney disease, stage 3 unspecified: Secondary | ICD-10-CM | POA: Diagnosis not present

## 2021-03-31 DIAGNOSIS — I2510 Atherosclerotic heart disease of native coronary artery without angina pectoris: Secondary | ICD-10-CM | POA: Diagnosis not present

## 2021-03-31 DIAGNOSIS — I82812 Embolism and thrombosis of superficial veins of left lower extremities: Secondary | ICD-10-CM | POA: Diagnosis not present

## 2021-03-31 DIAGNOSIS — Z79899 Other long term (current) drug therapy: Secondary | ICD-10-CM | POA: Diagnosis not present

## 2021-03-31 DIAGNOSIS — M14679 Charcot's joint, unspecified ankle and foot: Secondary | ICD-10-CM | POA: Diagnosis not present

## 2021-03-31 DIAGNOSIS — E1122 Type 2 diabetes mellitus with diabetic chronic kidney disease: Secondary | ICD-10-CM | POA: Diagnosis not present

## 2021-03-31 DIAGNOSIS — R5381 Other malaise: Secondary | ICD-10-CM | POA: Diagnosis not present

## 2021-03-31 DIAGNOSIS — I878 Other specified disorders of veins: Secondary | ICD-10-CM | POA: Diagnosis not present

## 2021-03-31 LAB — CBG MONITORING, ED: Glucose-Capillary: 256 mg/dL — ABNORMAL HIGH (ref 70–99)

## 2021-03-31 NOTE — ED Notes (Signed)
Pt and visitor notified registration staff that they were leaving. Seen leaving ED entrance.

## 2021-04-01 DIAGNOSIS — L97411 Non-pressure chronic ulcer of right heel and midfoot limited to breakdown of skin: Secondary | ICD-10-CM | POA: Diagnosis not present

## 2021-04-01 DIAGNOSIS — I10 Essential (primary) hypertension: Secondary | ICD-10-CM | POA: Diagnosis not present

## 2021-04-01 DIAGNOSIS — Z853 Personal history of malignant neoplasm of breast: Secondary | ICD-10-CM | POA: Diagnosis not present

## 2021-04-01 DIAGNOSIS — E11621 Type 2 diabetes mellitus with foot ulcer: Secondary | ICD-10-CM | POA: Diagnosis not present

## 2021-04-01 DIAGNOSIS — L97519 Non-pressure chronic ulcer of other part of right foot with unspecified severity: Secondary | ICD-10-CM | POA: Diagnosis not present

## 2021-04-01 DIAGNOSIS — E11628 Type 2 diabetes mellitus with other skin complications: Secondary | ICD-10-CM | POA: Diagnosis not present

## 2021-04-01 DIAGNOSIS — L089 Local infection of the skin and subcutaneous tissue, unspecified: Secondary | ICD-10-CM | POA: Diagnosis not present

## 2021-04-01 DIAGNOSIS — G629 Polyneuropathy, unspecified: Secondary | ICD-10-CM | POA: Diagnosis not present

## 2021-04-01 DIAGNOSIS — I82432 Acute embolism and thrombosis of left popliteal vein: Secondary | ICD-10-CM | POA: Diagnosis not present

## 2021-04-01 DIAGNOSIS — M7989 Other specified soft tissue disorders: Secondary | ICD-10-CM | POA: Diagnosis not present

## 2021-04-01 DIAGNOSIS — L97509 Non-pressure chronic ulcer of other part of unspecified foot with unspecified severity: Secondary | ICD-10-CM | POA: Diagnosis not present

## 2021-04-01 DIAGNOSIS — L03115 Cellulitis of right lower limb: Secondary | ICD-10-CM | POA: Diagnosis not present

## 2021-04-02 DIAGNOSIS — Z967 Presence of other bone and tendon implants: Secondary | ICD-10-CM | POA: Diagnosis not present

## 2021-04-02 DIAGNOSIS — I10 Essential (primary) hypertension: Secondary | ICD-10-CM | POA: Diagnosis not present

## 2021-04-02 DIAGNOSIS — E11621 Type 2 diabetes mellitus with foot ulcer: Secondary | ICD-10-CM | POA: Diagnosis not present

## 2021-04-02 DIAGNOSIS — L03115 Cellulitis of right lower limb: Secondary | ICD-10-CM | POA: Diagnosis not present

## 2021-04-02 DIAGNOSIS — M14679 Charcot's joint, unspecified ankle and foot: Secondary | ICD-10-CM | POA: Diagnosis not present

## 2021-04-02 DIAGNOSIS — E11628 Type 2 diabetes mellitus with other skin complications: Secondary | ICD-10-CM | POA: Diagnosis not present

## 2021-04-02 DIAGNOSIS — I878 Other specified disorders of veins: Secondary | ICD-10-CM | POA: Diagnosis not present

## 2021-04-02 DIAGNOSIS — S80821A Blister (nonthermal), right lower leg, initial encounter: Secondary | ICD-10-CM | POA: Diagnosis not present

## 2021-04-02 DIAGNOSIS — L97411 Non-pressure chronic ulcer of right heel and midfoot limited to breakdown of skin: Secondary | ICD-10-CM | POA: Diagnosis not present

## 2021-04-02 DIAGNOSIS — I890 Lymphedema, not elsewhere classified: Secondary | ICD-10-CM | POA: Diagnosis not present

## 2021-04-02 DIAGNOSIS — L97519 Non-pressure chronic ulcer of other part of right foot with unspecified severity: Secondary | ICD-10-CM | POA: Diagnosis not present

## 2021-04-02 DIAGNOSIS — I82432 Acute embolism and thrombosis of left popliteal vein: Secondary | ICD-10-CM | POA: Diagnosis not present

## 2021-04-02 DIAGNOSIS — L089 Local infection of the skin and subcutaneous tissue, unspecified: Secondary | ICD-10-CM | POA: Diagnosis not present

## 2021-04-02 DIAGNOSIS — Z853 Personal history of malignant neoplasm of breast: Secondary | ICD-10-CM | POA: Diagnosis not present

## 2021-04-03 DIAGNOSIS — M14671 Charcot's joint, right ankle and foot: Secondary | ICD-10-CM | POA: Diagnosis not present

## 2021-04-03 DIAGNOSIS — E11621 Type 2 diabetes mellitus with foot ulcer: Secondary | ICD-10-CM | POA: Diagnosis not present

## 2021-04-03 DIAGNOSIS — I130 Hypertensive heart and chronic kidney disease with heart failure and stage 1 through stage 4 chronic kidney disease, or unspecified chronic kidney disease: Secondary | ICD-10-CM | POA: Diagnosis not present

## 2021-04-03 DIAGNOSIS — G5780 Other specified mononeuropathies of unspecified lower limb: Secondary | ICD-10-CM | POA: Diagnosis not present

## 2021-04-03 DIAGNOSIS — S90821A Blister (nonthermal), right foot, initial encounter: Secondary | ICD-10-CM | POA: Diagnosis not present

## 2021-04-03 DIAGNOSIS — E1122 Type 2 diabetes mellitus with diabetic chronic kidney disease: Secondary | ICD-10-CM | POA: Diagnosis not present

## 2021-04-03 DIAGNOSIS — I82432 Acute embolism and thrombosis of left popliteal vein: Secondary | ICD-10-CM | POA: Diagnosis not present

## 2021-04-03 DIAGNOSIS — I509 Heart failure, unspecified: Secondary | ICD-10-CM | POA: Diagnosis not present

## 2021-04-03 DIAGNOSIS — F0390 Unspecified dementia without behavioral disturbance: Secondary | ICD-10-CM | POA: Diagnosis not present

## 2021-04-03 DIAGNOSIS — I2510 Atherosclerotic heart disease of native coronary artery without angina pectoris: Secondary | ICD-10-CM | POA: Diagnosis not present

## 2021-04-03 DIAGNOSIS — L03119 Cellulitis of unspecified part of limb: Secondary | ICD-10-CM | POA: Diagnosis not present

## 2021-04-03 DIAGNOSIS — M25512 Pain in left shoulder: Secondary | ICD-10-CM | POA: Diagnosis not present

## 2021-04-03 DIAGNOSIS — M14679 Charcot's joint, unspecified ankle and foot: Secondary | ICD-10-CM | POA: Diagnosis not present

## 2021-04-03 DIAGNOSIS — Z79899 Other long term (current) drug therapy: Secondary | ICD-10-CM | POA: Diagnosis not present

## 2021-04-03 DIAGNOSIS — M19012 Primary osteoarthritis, left shoulder: Secondary | ICD-10-CM | POA: Diagnosis not present

## 2021-04-03 DIAGNOSIS — Z967 Presence of other bone and tendon implants: Secondary | ICD-10-CM | POA: Diagnosis not present

## 2021-04-03 DIAGNOSIS — G8929 Other chronic pain: Secondary | ICD-10-CM | POA: Diagnosis not present

## 2021-04-03 DIAGNOSIS — L97519 Non-pressure chronic ulcer of other part of right foot with unspecified severity: Secondary | ICD-10-CM | POA: Diagnosis not present

## 2021-04-03 DIAGNOSIS — R600 Localized edema: Secondary | ICD-10-CM | POA: Diagnosis not present

## 2021-04-03 DIAGNOSIS — Z5189 Encounter for other specified aftercare: Secondary | ICD-10-CM | POA: Diagnosis not present

## 2021-04-03 DIAGNOSIS — Z794 Long term (current) use of insulin: Secondary | ICD-10-CM | POA: Diagnosis not present

## 2021-04-03 DIAGNOSIS — Z7984 Long term (current) use of oral hypoglycemic drugs: Secondary | ICD-10-CM | POA: Diagnosis not present

## 2021-04-03 DIAGNOSIS — I890 Lymphedema, not elsewhere classified: Secondary | ICD-10-CM | POA: Diagnosis not present

## 2021-04-03 DIAGNOSIS — L03115 Cellulitis of right lower limb: Secondary | ICD-10-CM | POA: Diagnosis not present

## 2021-04-03 DIAGNOSIS — E11628 Type 2 diabetes mellitus with other skin complications: Secondary | ICD-10-CM | POA: Diagnosis not present

## 2021-04-03 DIAGNOSIS — I10 Essential (primary) hypertension: Secondary | ICD-10-CM | POA: Diagnosis not present

## 2021-04-03 DIAGNOSIS — I878 Other specified disorders of veins: Secondary | ICD-10-CM | POA: Diagnosis not present

## 2021-04-03 DIAGNOSIS — N183 Chronic kidney disease, stage 3 unspecified: Secondary | ICD-10-CM | POA: Diagnosis not present

## 2021-04-03 DIAGNOSIS — Z9181 History of falling: Secondary | ICD-10-CM | POA: Diagnosis not present

## 2021-04-03 DIAGNOSIS — Z853 Personal history of malignant neoplasm of breast: Secondary | ICD-10-CM | POA: Diagnosis not present

## 2021-04-03 DIAGNOSIS — G2581 Restless legs syndrome: Secondary | ICD-10-CM | POA: Diagnosis not present

## 2021-04-03 DIAGNOSIS — E1140 Type 2 diabetes mellitus with diabetic neuropathy, unspecified: Secondary | ICD-10-CM | POA: Diagnosis not present

## 2021-04-03 DIAGNOSIS — E11610 Type 2 diabetes mellitus with diabetic neuropathic arthropathy: Secondary | ICD-10-CM | POA: Diagnosis not present

## 2021-04-03 DIAGNOSIS — R5381 Other malaise: Secondary | ICD-10-CM | POA: Diagnosis not present

## 2021-04-04 LAB — CULTURE, BLOOD (ROUTINE X 2)
Culture: NO GROWTH
Special Requests: ADEQUATE

## 2021-04-07 DIAGNOSIS — M19012 Primary osteoarthritis, left shoulder: Secondary | ICD-10-CM | POA: Diagnosis not present

## 2021-04-08 ENCOUNTER — Telehealth: Payer: Self-pay | Admitting: Hematology

## 2021-04-08 NOTE — Telephone Encounter (Signed)
Scheduled hospital follow-up appointment per 1/11 staff message. Patient will be made aware by hospital staff.

## 2021-04-10 ENCOUNTER — Telehealth: Payer: Self-pay

## 2021-04-10 NOTE — Telephone Encounter (Signed)
-----   Message from Evalee Jefferson, RN sent at 04/09/2021 12:28 PM EST ----- Regarding: FW: Hospital Discharge Appointment Can you all obtain this for me?  Thanks! ----- Message ----- From: Truitt Merle, MD Sent: 04/08/2021   2:13 PM EST To: Evalee Jefferson, RN Subject: RE: Hospital Discharge Appointment             I do not see much in her chart about her hospital admission. Could you call her tomorrow and find out where she was admitted and get a copy of discharge summary for me?  Thanks   Krista Blue  ----- Message ----- From: Merrilee Jansky Sent: 04/08/2021  10:14 AM EST To: Truitt Merle, MD, [place] [facility] Subject: Hospital Discharge Appointment                 Hello  This patient is going to be discharged from the hospital on 01/18 and needs a hospital follow-up appointment after that. You last saw this patient 12/05/19. Please advise.  Lajoyce Corners

## 2021-04-10 NOTE — Telephone Encounter (Signed)
-----   Message from Evalee Jefferson, RN sent at 04/09/2021 12:28 PM EST ----- Regarding: FW: Hospital Discharge Appointment Can you all obtain this for me?  Thanks! ----- Message ----- From: Truitt Merle, MD Sent: 04/08/2021   2:13 PM EST To: Evalee Jefferson, RN Subject: RE: Hospital Discharge Appointment             I do not see much in her chart about her hospital admission. Could you call her tomorrow and find out where she was admitted and get a copy of discharge summary for me?  Thanks   Krista Blue  ----- Message ----- From: Merrilee Jansky Sent: 04/08/2021  10:14 AM EST To: Truitt Merle, MD, [place] [facility] Subject: Hospital Discharge Appointment                 Hello  This patient is going to be discharged from the hospital on 01/18 and needs a hospital follow-up appointment after that. You last saw this patient 12/05/19. Please advise.  Lajoyce Corners

## 2021-04-10 NOTE — Telephone Encounter (Signed)
Spoke with Pt's current nurse Shirlee Limerick at [facility], explained to her that we need a faxed discharge summary for this pt when she is discharged and to fax also current plan of care/notes to update [name]. Shirlee Limerick verbalized understanding and fax number [phone] was given.

## 2021-04-16 DIAGNOSIS — L603 Nail dystrophy: Secondary | ICD-10-CM | POA: Diagnosis not present

## 2021-04-16 DIAGNOSIS — B351 Tinea unguium: Secondary | ICD-10-CM | POA: Diagnosis not present

## 2021-04-16 DIAGNOSIS — Z7409 Other reduced mobility: Secondary | ICD-10-CM | POA: Diagnosis not present

## 2021-04-16 DIAGNOSIS — Z794 Long term (current) use of insulin: Secondary | ICD-10-CM | POA: Diagnosis not present

## 2021-04-16 DIAGNOSIS — E11610 Type 2 diabetes mellitus with diabetic neuropathic arthropathy: Secondary | ICD-10-CM | POA: Diagnosis not present

## 2021-04-16 DIAGNOSIS — L84 Corns and callosities: Secondary | ICD-10-CM | POA: Diagnosis not present

## 2021-04-17 ENCOUNTER — Inpatient Hospital Stay: Payer: Medicare Other | Attending: Hematology | Admitting: Hematology

## 2021-04-17 ENCOUNTER — Other Ambulatory Visit: Payer: Self-pay

## 2021-04-17 VITALS — BP 140/86 | HR 73 | Temp 98.0°F | Resp 18

## 2021-04-17 DIAGNOSIS — C50412 Malignant neoplasm of upper-outer quadrant of left female breast: Secondary | ICD-10-CM | POA: Diagnosis not present

## 2021-04-17 DIAGNOSIS — Z7984 Long term (current) use of oral hypoglycemic drugs: Secondary | ICD-10-CM | POA: Insufficient documentation

## 2021-04-17 DIAGNOSIS — M109 Gout, unspecified: Secondary | ICD-10-CM | POA: Insufficient documentation

## 2021-04-17 DIAGNOSIS — I2510 Atherosclerotic heart disease of native coronary artery without angina pectoris: Secondary | ICD-10-CM | POA: Insufficient documentation

## 2021-04-17 DIAGNOSIS — Z86718 Personal history of other venous thrombosis and embolism: Secondary | ICD-10-CM | POA: Diagnosis not present

## 2021-04-17 DIAGNOSIS — M85852 Other specified disorders of bone density and structure, left thigh: Secondary | ICD-10-CM | POA: Diagnosis not present

## 2021-04-17 DIAGNOSIS — Z923 Personal history of irradiation: Secondary | ICD-10-CM | POA: Insufficient documentation

## 2021-04-17 DIAGNOSIS — Z79899 Other long term (current) drug therapy: Secondary | ICD-10-CM | POA: Insufficient documentation

## 2021-04-17 DIAGNOSIS — F0390 Unspecified dementia without behavioral disturbance: Secondary | ICD-10-CM | POA: Diagnosis not present

## 2021-04-17 DIAGNOSIS — C50211 Malignant neoplasm of upper-inner quadrant of right female breast: Secondary | ICD-10-CM | POA: Insufficient documentation

## 2021-04-17 DIAGNOSIS — K219 Gastro-esophageal reflux disease without esophagitis: Secondary | ICD-10-CM | POA: Diagnosis not present

## 2021-04-17 DIAGNOSIS — F419 Anxiety disorder, unspecified: Secondary | ICD-10-CM | POA: Insufficient documentation

## 2021-04-17 DIAGNOSIS — Z794 Long term (current) use of insulin: Secondary | ICD-10-CM | POA: Diagnosis not present

## 2021-04-17 DIAGNOSIS — Z1231 Encounter for screening mammogram for malignant neoplasm of breast: Secondary | ICD-10-CM

## 2021-04-17 DIAGNOSIS — Z170 Estrogen receptor positive status [ER+]: Secondary | ICD-10-CM | POA: Diagnosis not present

## 2021-04-17 DIAGNOSIS — E1122 Type 2 diabetes mellitus with diabetic chronic kidney disease: Secondary | ICD-10-CM | POA: Diagnosis not present

## 2021-04-17 DIAGNOSIS — Z8673 Personal history of transient ischemic attack (TIA), and cerebral infarction without residual deficits: Secondary | ICD-10-CM | POA: Insufficient documentation

## 2021-04-17 DIAGNOSIS — I130 Hypertensive heart and chronic kidney disease with heart failure and stage 1 through stage 4 chronic kidney disease, or unspecified chronic kidney disease: Secondary | ICD-10-CM | POA: Diagnosis not present

## 2021-04-17 DIAGNOSIS — F0394 Unspecified dementia, unspecified severity, with anxiety: Secondary | ICD-10-CM | POA: Insufficient documentation

## 2021-04-17 DIAGNOSIS — E1142 Type 2 diabetes mellitus with diabetic polyneuropathy: Secondary | ICD-10-CM | POA: Insufficient documentation

## 2021-04-17 DIAGNOSIS — Z79811 Long term (current) use of aromatase inhibitors: Secondary | ICD-10-CM | POA: Diagnosis not present

## 2021-04-17 DIAGNOSIS — E785 Hyperlipidemia, unspecified: Secondary | ICD-10-CM | POA: Insufficient documentation

## 2021-04-17 DIAGNOSIS — N184 Chronic kidney disease, stage 4 (severe): Secondary | ICD-10-CM | POA: Diagnosis not present

## 2021-04-17 MED ORDER — ALPRAZOLAM 0.25 MG PO TABS: 0.2500 mg | ORAL_TABLET | Freq: Two times a day (BID) | ORAL | 0 refills | Status: AC | PRN

## 2021-04-17 MED ORDER — EXEMESTANE 25 MG PO TABS: 25.0000 mg | ORAL_TABLET | Freq: Every day | ORAL | 3 refills | Status: DC

## 2021-04-17 NOTE — Progress Notes (Signed)
[name]   Telephone:([phone]) [phone] Fax:(336) [phone]   Clinic Follow up Note   Patient Care Team: Cipriano Mile, NP as PCP - General Leonie Man, MD as PCP - Cardiology (Cardiology) Elmarie Shiley, MD as Consulting Physician (Nephrology) Leonie Man, MD as Consulting Physician (Cardiology) Alphonsa Overall, MD as Consulting Physician (General Surgery) Kyung Rudd, MD as Consulting Physician (Radiation Oncology) Truitt Merle, MD as Consulting Physician (Hematology) Delice Bison Charlestine Massed, NP as Nurse Practitioner (Hematology and Oncology)  Date of Service:  04/17/2021  CHIEF COMPLAINT: f/u of hospital visit, bilateral invasive breast cancer  CURRENT THERAPY:  Adjuvant Exemestane started on 03/07/17  ASSESSMENT & PLAN:  [name] is a 81 y.o. female with   1. Recent Hospitalization -presented with left leg swelling and right foot wound on 03/31/21. (Wound from surgery in 2019, see #6) -She was found to have left leg DVT and was put on anticoagulation, as well as put on antibiotics for her right foot wound. -she was transferred to rehab and released on 04/15/21. She is recovering slowly   2. Dementia, Anxiety -daughter reports her dementia is worsening. Her memory seems good today. -patient became tearful and requested a medicine "to get me through this." With now being wheelchair-bound, her anxiety has been worse. She also notes her husband's health is also going down. -I advised them to seek out consultation with geriatrics. In the meantime, I prescribed Xanax for her to use as needed. We discussed that this can cause dependence, and they are aware.  3. Bilateral breast ductal carcinoma, UIQ of right breast, pT1cN0M0 and UOQ of left breast, pT1bN0M0, stage IA, ER+/PR+/HER2- -She was diagnosed in 09/2016. She is s/p b/l breast lumpectomy and adjuvant radiation. 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  -She started antiestrogen  therapy with Exemestane in 02/2017. She is tolerating well with no side effects, plan for 5 years therapy  -most recent mammogram 10/29/19 was benign. Repeat has been delayed due to her recent medical problems. She plans to repeat soon. -From a breast cancer standpoint, she is clinically doing well. Lab from 04/13/21 reviewed, overall stable with no concern. Her physical exam benign. There is no clinical concern for recurrence. -Continue surveillance. -Continue Exemestane through 02/2022. -f/u in 02/2022    4. Genetics Negative for pathogenetic mutations.    5. HTN, DM, CAD and CHF with LE edema, CKD Stage IV, Skin Cancers (mainly squamous Cel) -Continue f/u with PCP, Dermatologist, nephrologist [name], and her cardiologist [name]   6. Arthritis, diabetic foot, recent fall and balance issue  -She underwent right foot surgery in 01/2018. She had sepsis afterward, has resolved/recovered.  -She had diabetic foot along with worsened LE edema in her ankles which effects her balance.  -She tried b/l lower leg braces with orthopedist recently to help her balance but resulted in a bad fall in 10/2019.  -she has difficulty to ambulate due to her balance issue  -I recommend home PT to help gain strength and improve balance.    7. Osteopenia  -Her 04/2019 DEXA showed worsened Osteopenia with lowest T-score -2.3 at left hip. -Given her CKD, will hold treatment with Bisphosphonate  -Continue calcium and vit D     PLAN -Continue Exemestane through 02/2022, I refilled today -I called in Xanax, she will discuss her anxiety issue with her PCP, I do not plan to refill xanax  -I ordered mammogram to be done next month. -Lab and  f/u in 02/2022    No problem-specific Assessment & Plan notes found for this encounter.   SUMMARY OF ONCOLOGIC HISTORY: Oncology History Overview Note  Cancer Staging Malignant 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, estrogen receptor positive  ([place]) Staging form: Breast, AJCC 8th Edition - Clinical: Stage IA (cT1c, cN0, cM0, G1, ER: Positive, PR: Positive, HER2: Negative) - Unsigned     Malignant 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, estrogen receptor positive ([place])  10/20/2016 Initial Biopsy   Diagnosis Breast, right, needle core biopsy, 11:30 o'clock - INVASIVE DUCTAL CARCINOMA,   10/20/2016 Initial Diagnosis   Malignant 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, estrogen receptor positive ([place])   10/20/2016 Receptors her2   Estrogen Receptor: 100%, POSITIVE, STRONG STAINING INTENSITY Progesterone Receptor: 100%, POSITIVE, STRONG STAINING INTENSITY Proliferation Marker Ki67: 5% HER2 NEGATIVE    10/20/2016 Mammogram   Diagnostic mammogram showed persistent distortion corresponds to a vaguely palpable irregular mass and is suspicious for malignancy.Targeted ultrasound is performed, showing an irregular hypoechoic mass with posterior acoustic shadowing in the 11:30 o'clock location of the right breast 4 cm from the nipple which measures 0.9 x 0.9 x0.8 cm. There is associated internal vascularity. Evaluation of the axilla is negative for adenopathy.   11/01/2016 Imaging   B/l BREAST MRI: IMPRESSION: 1. There is a 1.5 cm biopsy proven malignancy in the upper slightly outer quadrant of the right breast. No additional sites of disease are identified.   2. There is an indeterminate 6 mm enhancing mass in the upper-outer left breast.   11/11/2016 Pathology Results   Diagnosis Breast, left, needle core biopsy, upper outer - INVASIVE DUCTAL CARCINOMA. - ATYPICAL LOBULAR HYPERPLASIA. - FIBROCYSTIC CHANGES AND PSEUDOANGIOMATOUS STROMAL HYPERPLASIA ([place]).   11/11/2016 Receptors her2    Estrogen Receptor: 100%, POSITIVE, STRONG STAINING INTENSITY Progesterone Receptor: 10%, POSITIVE, WEAK STAINING INTENSITY Proliferation Marker Ki67: 2% HER2 NEGATIVE    12/03/2016 Genetic Testing    Germline genetic testing was performed through [employer]'s Common Hereditary Cancers Panel + [employer]'s Melanoma Panel. This custom panel includes analysis of the following 51 genes: APC, ATM, AXIN2, BAP1, BARD1, BMPR1A, BRCA1, BRCA2, BRIP1, CDH1, CDK4, CDKN2A, CHEK2, CTNNA1, DICER1, EPCAM, GREM1, HOXB13, KIT, MEN1, MITF, MLH1, MSH2, MSH3, MSH6, MUTYH, NBN, NF1, NTHL1, PALB2, PDGFRA, PMS2, POLD1, POLE, POT1, PTEN, RAD50, RAD51C, RAD51D, RB1, SDHA, SDHB, SDHC, SDHD, SMAD4, SMARCA4, STK11, TP53, TSC1, TSC2, and VHL.   A variant of uncertain significance (VUS) was noted in BARD1.    12/14/2016 Miscellaneous   Oncotype Recurrence score of 16 10-year risk of distant recurrence with Tamoxifen alone is 10%   12/14/2016 Surgery   BILATERAL BREAST LUMPECTOMY WITH BILATERAL  RADIOACTIVE SEED AND BILATERAL AXILLARY  SENTINEL LYMPH NODE BIOPSY ERAS PATHWAY by Dr. Lucia Gaskins on 12/14/16   12/14/2016 Pathology Results   Diagnosis 12/14/16 1. Breast, lumpectomy, Left w/seed - INVASIVE DUCTAL CARCINOMA, 1 CM. - ATYPICAL LOBULAR HYPERPLASIA ([place]). - PREVIOUS BIOPSY SITE. - INVASIVE CARCINOMA 0.2 CM FROM LATERAL MARGIN. - ATYPICA LOBULAR HYPERPLASIA LESS THAN 0.1 CM FROM LATERAL MARGIN. 2. Breast, lumpectomy, Right w/seed - INVASIVE DUCTAL CARCINOMA, 1.1 CM. - FIBROCYSTIC CHANGES. - PREVIOUS BIOPSY SITE. - INVASIVE CARCINOMA 1.1 CM FROM POSTERIOR MARGIN. 3. Lymph node, sentinel, biopsy, Right Axillary Contents - BENIGN FIBROADIPOSE TISSUE. - NO LYMPH NODE TISSUE OR MALIGNANCY. 4. Lymph node, sentinel, biopsy, Left Axillary - ONE BENIGN LYMPH NODE (0/1).    01/17/2017 - 02/11/2017 Radiation Therapy   1. The Left breast  was treated to 42.5 Gy in 17 fractions of 2.5 Gy.  2. The Right breast was treated to 42.5 Gy in 17 fractions of 2.5 Gy.  3. The Right breast was boosted to 7.5 Gy in 3 fractions of 2.5 Gy.  4. The Left breast was boosted to 7.5 Gy in 3 fractions of 2.5 Gy.       02/2017 -  Anti-estrogen oral  therapy   Exemestane daily      INTERVAL HISTORY:  Kieryn Burtis is here for a follow up of recent hospital visit. She was last seen by me on 12/05/19. She presents to the clinic accompanied by her daughter.   All other systems were reviewed with the patient and are negative.  MEDICAL HISTORY:  Past Medical History:  Diagnosis Date   Anemia    have received iron infusions   Anxiety    Arthritis     osteoarthritis   Breast cancer ([place])    Breast cancer in female [facility])    Bilateral   CAD S/P percutaneous coronary angioplasty 2007; March 2011   [employer] 3.0 mm 20 mm postdilated 3.6 mm in early mid LAD; status post ISR Cutting Balloon PTCA and March 11 along with PCI of distal mid lesion with a 3.0 mm 12 mm MultiLink vision BMS; the proximal stent causes jailing of SP1 and SP2 with ostial 70-80% lesions   Cancer ([place])    Melanoma, Squamous cell Carcinoma   Charcot's joint of foot, right    Charcot's joint of left foot    Chronic diastolic CHF (congestive heart failure), NYHA class 2 ([place]) 02/28/2018   Chronic kidney disease    stage 2    Coronary artery disease    Dementia (HCC)    mild   Diabetes mellitus type 2 with neurological manifestations ([place])    Diabetes mellitus without complication (H[facility])    Dyslipidemia, goal LDL below 70    Foot pain, bilateral    Full dentures    Genetic testing 12/03/2016   Germline genetic testing was performed through [employer]'s Common Hereditary Cancers Panel + [facility]'s Melanoma Panel. This custom panel includes analysis of the following 51 genes: APC, ATM, AXIN2, BAP1, BARD1, BMPR1A, BRCA1, BRCA2, BRIP1, CDH1, CDK4, CDKN2A, CHEK2, CTNNA1, DICER1, EPCAM, GREM1, HOXB13, KIT, MEN1, MITF, MLH1, MSH2, MSH3, MSH6, MUTYH, NBN, NF1, NTHL1, PALB2, PDGFRA, PMS2, POLD1, POL   GERD (gastroesophageal reflux disease)    Gout    Headache(784.0)    History of hematuria    Followed by Dr. Gaynelle Arabian   Hypertension, essential, benign    Migraine    Mild  cognitive impairment    Peripheral neuropathy    Personal history of radiation therapy    Post herpetic neuralgia    Restless leg syndrome    Stroke ([place])    TIA (transient ischemic attack)    multiple in the past   TIA (transient ischemic attack)    Wears glasses     SURGICAL HISTORY: Past Surgical History:  Procedure Laterality Date   ABDOMINAL HYSTERECTOMY     ANKLE FUSION Right 02/22/2018   Procedure: RIGHT SUBTALAR AND TALONAVICULAR FUSION;  Surgeon: Newt Minion, MD;  Location: [facility];  Service: Orthopedics;  Laterality: Right;   APPENDECTOMY     BIOPSY  02/19/2020   Procedure: BIOPSY;  Surgeon: Otis Brace, MD;  Location: [place];  Service: Gastroenterology;;   BLADDER SUSPENSION     BREAST LUMPECTOMY Bilateral 2018   BREAST LUMPECTOMY WITH RADIOACTIVE SEED  AND SENTINEL LYMPH NODE BIOPSY Bilateral 12/14/2016   Procedure: BILATERAL BREAST LUMPECTOMY WITH BILATERAL  RADIOACTIVE SEED AND BILATERAL AXILLARY  SENTINEL LYMPH NODE BIOPSY ERAS PATHWAY;  Surgeon: Alphonsa Overall, MD;  Location: [place];  Service: General;  Laterality: Bilateral;  [place]  02/24/2006   85% stenosis in the proximal portion of LAD-arrangements made for PCI on 02/25/2006   CARDIAC CATHETERIZATION  02/25/2006   80% LAD lesion stented with a 3x45m Liberte stent resulting in reduction of 80% lesion to 0% residual   CARDIAC CATHETERIZATION  04/26/2006   Medical management   CARDIAC CATHETERIZATION  06/24/2009   60-70% re-stenosis in the proximal LAD. A 3.25x15 cutting balloon, 3 inflations - 14atm-38sec, 13atm-39sec, and 12atm-40sec reduced to less than 10%. 60% stenosis of the mid/distal LAD stented with a 3x173mMultilink stent.   CARDIAC CATHETERIZATION  07/09/2009   Medical management   CARDIAC CATHETERIZATION     CAROTID DOPPLER  06/24/2009   40-59% R ICA stenosis. No significant ICA stenosis noted   CHOLECYSTECTOMY     CORONARY ANGIOPLASTY     DILATION AND  CURETTAGE OF UTERUS     ESOPHAGOGASTRODUODENOSCOPY N/A 02/26/2020   Procedure: ESOPHAGOGASTRODUODENOSCOPY (EGD);  Surgeon: MaClarene EssexMD;  [facility] Service: Endoscopy;  Laterality: N/A;   ESOPHAGOGASTRODUODENOSCOPY (EGD) WITH PROPOFOL N/A 02/19/2020   Procedure: ESOPHAGOGASTRODUODENOSCOPY (EGD) WITH PROPOFOL;  Surgeon: BrOtis BraceMD;  [facility] Service: Gastroenterology;  Laterality: N/A;   ESOPHAGOGASTRODUODENOSCOPY (EGD) WITH PROPOFOL Left 02/23/2020   Procedure: ESOPHAGOGASTRODUODENOSCOPY (EGD) WITH PROPOFOL;  Surgeon: OuArta SilenceMD;  [place] [facility] Service: Endoscopy;  Laterality: Left;   HIP ARTHROPLASTY Left 02/10/2020   Procedure: ARTHROPLASTY BIPOLAR HIP (HEMIARTHROPLASTY) POSTERIOR LATERAL;  Surgeon: MuRenette ButtersMD;  [place] [facility] Service: Orthopedics;  Laterality: Left;   JOINT REPLACEMENT Left    LESION REMOVAL Right 03/11/2015   Procedure:  EXCISION OF RIGHT PRE TIBIAL LESION;  Surgeon: JaJudeth HornMD;  [facility] [facility] Service: General;  Laterality: Right;   MASS EXCISION Left 06/10/2014   Procedure: EXCISION LEFT LOWER LEG LESION;  Surgeon: JaDoreen SalvageMD;  [place] [facility] Service: General;  Laterality: Left;   MASS EXCISION Left 09/05/2015   Procedure: EXCISION OF LEFT FOREARM  MASS;  Surgeon: JaJudeth HornMD;  [place] Service: General;  Laterality: Left;   NM MYOVIEW LTD  10/2011   No ischemia or infarction.  Normal EF.  LOW RISK. (Short bursts of 5 beats NSVT during recovery otherwise normal)   POLYPECTOMY  02/19/2020   Procedure: POLYPECTOMY;  Surgeon: BrOtis BraceMD;  [facility];  Service: Gastroenterology;;   SHOULDER ARTHROSCOPY  10/15   SHOULDER ARTHROSCOPY  1995   left   TEE WITHOUT CARDIOVERSION N/A 08/03/2017   Procedure: TRANSESOPHAGEAL ECHOCARDIOGRAM (TEE);  Surgeon: RaSkeet LatchMD;  [facility]  Service: Cardiovascular;; EF 60-65% with no wall motion normalities.  No atrial thrombus noted.  Lightly findings consistent with a lipomatous hypertrophy of the atrial septum.    TENDON REPAIR Left 05/12/2016   Procedure: Left Anterior Tibial Tendon Reconstruction;  Surgeon: MaNewt MinionMD;  [place] [facility] Service: Orthopedics;  Laterality: Left;   TOTAL KNEE ARTHROPLASTY  2005   left   TRANSESOPHAGEAL ECHOCARDIOGRAM  08/03/2016   : EF 60-65% no wall motion normalities.  No atrial thrombus.  Lipomatous hypertrophy of the atrial septum.  No mass noted.   TRANSESOPHAGEAL ECHOCARDIOGRAM  07/2017   EF 60 to 65%.  No R WMA.  No atrial thrombus noted.  Lipomatous IAS.   TRANSTHORACIC ECHOCARDIOGRAM  02/25/2017   :EF 65-70%.  GR 1 DD.  No or WMA.  Moderate aortic calcification/sclerosis but no stenosis.   TRANSTHORACIC ECHOCARDIOGRAM  07/2017   Vigorous LV function.  EF 65-70%.  Mild diastolic dysfunction with no RWMA. GR 1 DD.  Mild aortic sclerosis/no stenosis.  Questionable right atrial mass discounted with TEE)    WRIST ARTHROPLASTY  2010   cancer lt wrist    I have reviewed the social history and family history with the patient and they are unchanged from previous note.  ALLERGIES:  is allergic to nsaids, reglan [metoclopramide], tramadol, ultram [tramadol], lipitor [atorvastatin], lipitor [atorvastatin], lyrica [pregabalin], lyrica [pregabalin], reglan [metoclopramide], nsaids, and urecholine [bethanechol].  MEDICATIONS:  Current Outpatient Medications  Medication Sig Dispense Refill   acetaminophen-codeine (TYLENOL #3) 300-30 MG tablet Take 1 tablet by mouth every 4 (four) hours as needed for moderate pain.     BIOTIN PO Take 1 tablet by mouth daily.     Blood Glucose Monitoring Suppl (FREESTYLE LITE) w/Device KIT use as directed 1 kit 0   bumetanide (BUMEX) 2 MG tablet Take 2 mg by mouth 3 (three) times daily.     Calcium Carb-Cholecalciferol 600-800 MG-UNIT TABS Take 1 tablet by  mouth daily.     Cholecalciferol (VITAMIN D) 50 MCG (2000 UT) tablet Take 2,000 Units by mouth daily.     clopidogrel (PLAVIX) 75 MG tablet Take 75 mg by mouth daily.     Coenzyme Q10 (COQ-10 PO) Take 1 tablet by mouth daily.     COLCRYS 0.6 MG tablet TAKE 1 TABLET DAILY AS NEEDED FOR GOUT FLARE. 90 tablet 3   Cranberry 500 MG TABS Take 500 mg by mouth every evening.     CRANBERRY-CALCIUM PO Take 1 tablet by mouth daily.     diazepam (VALIUM) 5 MG tablet Take 5 mg by mouth 3 (three) times daily as needed for anxiety.     exemestane (AROMASIN) 25 MG tablet TAKE 1 TABLET DAILY AFTER BREAKFAST (Patient taking differently: Take 25 mg by mouth daily after breakfast. TAKE 1 TABLET DAILY AFTER BREAKFAST) 90 tablet 3   febuxostat (ULORIC) 40 MG tablet Take 40 mg by mouth daily.     gabapentin (NEURONTIN) 300 MG capsule Take 300 mg by mouth 2 (two) times daily.      glipiZIDE (GLUCOTROL) 5 MG tablet Take 2.5 mg by mouth daily before breakfast.     glucose blood (FREESTYLE LITE) test strip Test blood sugars up to 4 times daily 100 each 3   insulin glargine (LANTUS) 100 UNIT/ML Solostar Pen Inject 5 Units into the skin daily. 3 mL 0   Insulin Pen Needle (PENTIPS) 32G X 4 MM MISC Use as directed 100 each 5   Insulin Pen Needle 32G X 4 MM MISC 5 Units by Does not apply route in the morning and at bedtime. 100 each 0   insulin regular (NOVOLIN R) 100 units/mL injection Inject into the skin 3 (three) times daily before meals. Started last week 4/28: Sliding scale before meals: <150 none,  151-199 - 2 units                                                                            200-249 - 4 units                                                                            250-299 - 6 units                                                                            300-349 - 8 units                                                                             350> 10 units     JANUVIA 25 MG tablet Take 25 mg by mouth daily.     Krill Oil 500 MG CAPS Take 500 mg by mouth daily.     Lancets (FREESTYLE) lancets Test blood sugars up to 4 times daily 100 each 3   Methylfol-Methylcob-Acetylcyst (CEREFOLIN NAC) 6-2-600 MG TABS TAKE 1 TABLET BY MOUTH EVERY DAY 90 tablet 3   metolazone (ZAROXOLYN) 2.5 MG tablet Take 2.5 mg by mouth daily. 30 minutes before Bumetanide Mon. & Thurs.     metoprolol succinate (TOPROL-XL) 25 MG 24 hr tablet Take 25 mg by mouth at bedtime.      mirtazapine (REMERON) 15 MG tablet Take 15 mg by mouth at bedtime. Taking 1 1/2 tablet at bedtime     Multiple Vitamin (MULTIVITAMIN WITH MINERALS) TABS tablet Take 1 tablet by mouth daily.     nitroGLYCERIN (NITROLINGUAL) 0.4 MG/SPRAY spray Place 1 spray under the tongue every 5 (five) minutes x 3 doses as needed for chest pain.     pantoprazole (PROTONIX) 40 MG tablet Take 1 tablet (40 mg total) by mouth 2 (two) times daily before a meal.     potassium chloride SA (KLOR-CON) 20 MEQ tablet Take 20 mEq by mouth 2 (two) times daily.     pramipexole (MIRAPEX) 0.25 MG tablet TAKE 1 TO 2 TABLETS AS INSTRUCTED, 1 TABLET AT 4:00 P.M. AND 2 TABLETS AT BEDTIME 270 tablet 1   RESVERATROL-GRAPE PO Take 500 mg by mouth.     rosuvastatin (CRESTOR) 20 MG tablet Take 20 mg by mouth daily.     No current facility-administered medications for this visit.  PHYSICAL EXAMINATION: ECOG PERFORMANCE STATUS: 3 - Symptomatic, >50% confined to bed  There were no vitals filed for this visit. Wt Readings from Last 3 Encounters:  07/21/20 174 lb 9.6 oz (79.2 kg)  03/02/20 200 lb 6.4 oz (90.9 kg)  02/17/20 188 lb 15 oz (85.7 kg)     GENERAL:alert, no distress and comfortable SKIN: skin color, texture, turgor are normal, no rashes, (+) several moles present EYES: normal, Conjunctiva are pink and non-injected, sclera clear  NECK: supple, thyroid normal size, non-tender, without  nodularity LYMPH:  no palpable lymphadenopathy in the cervical, axillary  LUNGS: clear to auscultation and percussion with normal breathing effort HEART: regular rate & rhythm and no murmurs and no lower extremity edema ABDOMEN:abdomen soft, non-tender and normal bowel sounds Musculoskeletal:no cyanosis of digits and no clubbing  NEURO: alert & oriented x 3 with fluent speech, no focal motor/sensory deficits BREAST: No palpable mass, nodules or adenopathy bilaterally. Breast exam benign.   LABORATORY DATA:  I have reviewed the data as listed CBC Latest Ref Rng & Units 03/30/2021 07/21/2020 07/19/2020  WBC 4.0 - 10.5 K/uL 9.0 6.9 9.2  Hemoglobin 12.0 - 15.0 g/dL 13.2 13.3 14.0  Hematocrit 36.0 - 46.0 % 41.4 42.1 43.3  Platelets 150 - 400 K/uL 207 207 215     CMP Latest Ref Rng & Units 03/30/2021 07/21/2020 07/20/2020  Glucose 70 - 99 mg/dL 215(H) 228(H) 310(H)  BUN 8 - 23 mg/dL _0 Creatinine 0.44 - 1.00 mg/dL 1.66(H) 1.35(H) 1.38(H)  Sodium 135 - 145 mmol/L 137 137 134(L)  Potassium 3.5 - 5.1 mmol/L 3.4(L) 4.1 4.2  Chloride 98 - 111 mmol/L 96(L) 99 99  CO2 22 - 32 mmol/L _1 Calcium 8.9 - 10.3 mg/dL 9.7 9.8 9.6  Total Protein 6.5 - 8.1 g/dL 6.9 - -  Total Bilirubin 0.3 - 1.2 mg/dL 0.8 - -  Alkaline Phos 38 - 126 U/L 82 - -  AST 15 - 41 U/L 25 - -  ALT 0 - 44 U/L 18 - -      RADIOGRAPHIC STUDIES: I have personally reviewed the radiological images as listed and agreed with the findings in the report. No results found.    Orders Placed This Encounter  Procedures   MM Digital Screening    Standing Status:   Future    Standing Expiration Date:   04/17/2022    Order Specific Question:   Reason for Exam (SYMPTOM  OR DIAGNOSIS REQUIRED)    Answer:   screening    Order Specific Question:   Preferred imaging location?    Answer:   [facility]   All questions were answered. The patient knows to call the clinic with any problems, questions or concerns. No barriers to  learning was detected. The total time spent in the appointment was 30 minutes.     Truitt Merle, MD 04/17/2021   I, Wilburn Mylar, am acting as scribe for Truitt Merle, MD.   I have reviewed the above documentation for accuracy and completeness, and I agree with the above.

## 2021-04-18 ENCOUNTER — Encounter: Payer: Self-pay | Admitting: Hematology

## 2021-04-19 DIAGNOSIS — M25512 Pain in left shoulder: Secondary | ICD-10-CM | POA: Diagnosis not present

## 2021-04-19 DIAGNOSIS — N1831 Chronic kidney disease, stage 3a: Secondary | ICD-10-CM | POA: Diagnosis not present

## 2021-04-19 DIAGNOSIS — I509 Heart failure, unspecified: Secondary | ICD-10-CM | POA: Diagnosis not present

## 2021-04-19 DIAGNOSIS — Z794 Long term (current) use of insulin: Secondary | ICD-10-CM | POA: Diagnosis not present

## 2021-04-19 DIAGNOSIS — E1140 Type 2 diabetes mellitus with diabetic neuropathy, unspecified: Secondary | ICD-10-CM | POA: Diagnosis not present

## 2021-04-19 DIAGNOSIS — L03115 Cellulitis of right lower limb: Secondary | ICD-10-CM | POA: Diagnosis not present

## 2021-04-19 DIAGNOSIS — Z7901 Long term (current) use of anticoagulants: Secondary | ICD-10-CM | POA: Diagnosis not present

## 2021-04-19 DIAGNOSIS — Z853 Personal history of malignant neoplasm of breast: Secondary | ICD-10-CM | POA: Diagnosis not present

## 2021-04-19 DIAGNOSIS — E1122 Type 2 diabetes mellitus with diabetic chronic kidney disease: Secondary | ICD-10-CM | POA: Diagnosis not present

## 2021-04-19 DIAGNOSIS — E11628 Type 2 diabetes mellitus with other skin complications: Secondary | ICD-10-CM | POA: Diagnosis not present

## 2021-04-19 DIAGNOSIS — G8929 Other chronic pain: Secondary | ICD-10-CM | POA: Diagnosis not present

## 2021-04-19 DIAGNOSIS — I82432 Acute embolism and thrombosis of left popliteal vein: Secondary | ICD-10-CM | POA: Diagnosis not present

## 2021-04-19 DIAGNOSIS — I130 Hypertensive heart and chronic kidney disease with heart failure and stage 1 through stage 4 chronic kidney disease, or unspecified chronic kidney disease: Secondary | ICD-10-CM | POA: Diagnosis not present

## 2021-04-19 DIAGNOSIS — I2510 Atherosclerotic heart disease of native coronary artery without angina pectoris: Secondary | ICD-10-CM | POA: Diagnosis not present

## 2021-04-19 DIAGNOSIS — Z7902 Long term (current) use of antithrombotics/antiplatelets: Secondary | ICD-10-CM | POA: Diagnosis not present

## 2021-04-19 DIAGNOSIS — F0394 Unspecified dementia, unspecified severity, with anxiety: Secondary | ICD-10-CM | POA: Diagnosis not present

## 2021-04-19 DIAGNOSIS — G2581 Restless legs syndrome: Secondary | ICD-10-CM | POA: Diagnosis not present

## 2021-04-19 DIAGNOSIS — E11610 Type 2 diabetes mellitus with diabetic neuropathic arthropathy: Secondary | ICD-10-CM | POA: Diagnosis not present

## 2021-04-21 ENCOUNTER — Telehealth: Payer: Self-pay | Admitting: Hematology

## 2021-04-21 NOTE — Telephone Encounter (Signed)
Left message with follow-up appointment per 1/20 los.

## 2021-04-22 DIAGNOSIS — E785 Hyperlipidemia, unspecified: Secondary | ICD-10-CM | POA: Diagnosis not present

## 2021-04-22 DIAGNOSIS — E663 Overweight: Secondary | ICD-10-CM | POA: Diagnosis not present

## 2021-04-22 DIAGNOSIS — E1122 Type 2 diabetes mellitus with diabetic chronic kidney disease: Secondary | ICD-10-CM | POA: Diagnosis not present

## 2021-04-22 DIAGNOSIS — Z0001 Encounter for general adult medical examination with abnormal findings: Secondary | ICD-10-CM | POA: Diagnosis not present

## 2021-04-22 DIAGNOSIS — C50919 Malignant neoplasm of unspecified site of unspecified female breast: Secondary | ICD-10-CM | POA: Diagnosis not present

## 2021-04-22 DIAGNOSIS — I700 Atherosclerosis of aorta: Secondary | ICD-10-CM | POA: Diagnosis not present

## 2021-04-22 DIAGNOSIS — M109 Gout, unspecified: Secondary | ICD-10-CM | POA: Diagnosis not present

## 2021-04-22 DIAGNOSIS — Z79899 Other long term (current) drug therapy: Secondary | ICD-10-CM | POA: Diagnosis not present

## 2021-04-22 DIAGNOSIS — E11610 Type 2 diabetes mellitus with diabetic neuropathic arthropathy: Secondary | ICD-10-CM | POA: Diagnosis not present

## 2021-04-22 DIAGNOSIS — F419 Anxiety disorder, unspecified: Secondary | ICD-10-CM | POA: Diagnosis not present

## 2021-04-22 DIAGNOSIS — D6859 Other primary thrombophilia: Secondary | ICD-10-CM | POA: Diagnosis not present

## 2021-04-22 DIAGNOSIS — F0390 Unspecified dementia without behavioral disturbance: Secondary | ICD-10-CM | POA: Diagnosis not present

## 2021-04-23 DIAGNOSIS — N1831 Chronic kidney disease, stage 3a: Secondary | ICD-10-CM | POA: Diagnosis not present

## 2021-04-23 DIAGNOSIS — I509 Heart failure, unspecified: Secondary | ICD-10-CM | POA: Diagnosis not present

## 2021-04-23 DIAGNOSIS — L03115 Cellulitis of right lower limb: Secondary | ICD-10-CM | POA: Diagnosis not present

## 2021-04-23 DIAGNOSIS — I130 Hypertensive heart and chronic kidney disease with heart failure and stage 1 through stage 4 chronic kidney disease, or unspecified chronic kidney disease: Secondary | ICD-10-CM | POA: Diagnosis not present

## 2021-04-23 DIAGNOSIS — E11628 Type 2 diabetes mellitus with other skin complications: Secondary | ICD-10-CM | POA: Diagnosis not present

## 2021-04-23 DIAGNOSIS — E1122 Type 2 diabetes mellitus with diabetic chronic kidney disease: Secondary | ICD-10-CM | POA: Diagnosis not present

## 2021-04-23 IMAGING — MG DIGITAL DIAGNOSTIC BILAT W/ TOMO W/ CAD
8 of 12 series · 9 of 32 positions shown · non-contrast
Comparison: Previous exam(s).

CLINICAL DATA: 79-year-old female presenting for follow-up status
post bilateral lumpectomies in 8340.

EXAM:
DIGITAL DIAGNOSTIC BILATERAL MAMMOGRAM WITH TOMO AND CAD

[R MLO]
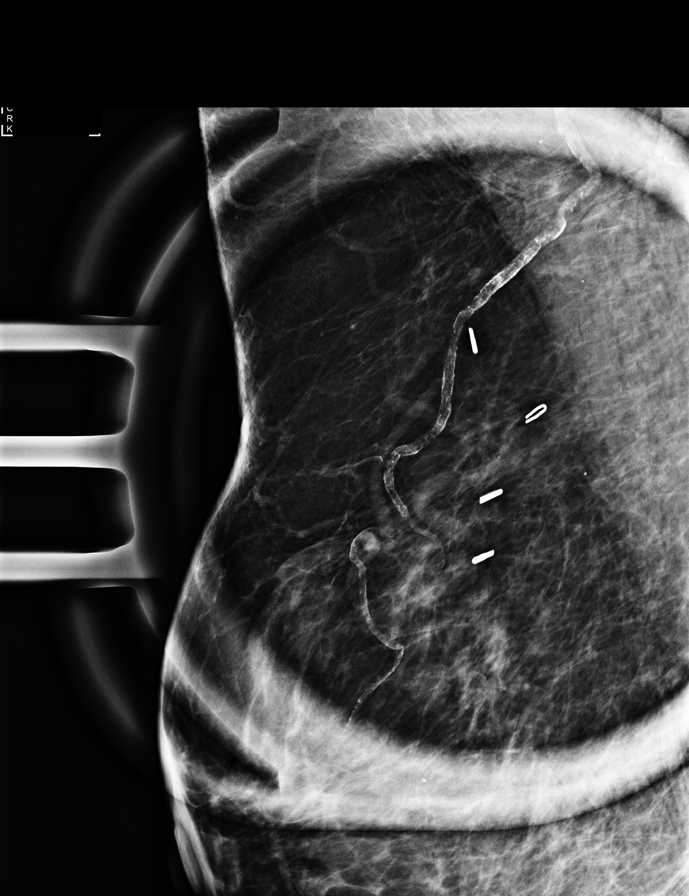

[L MLO]
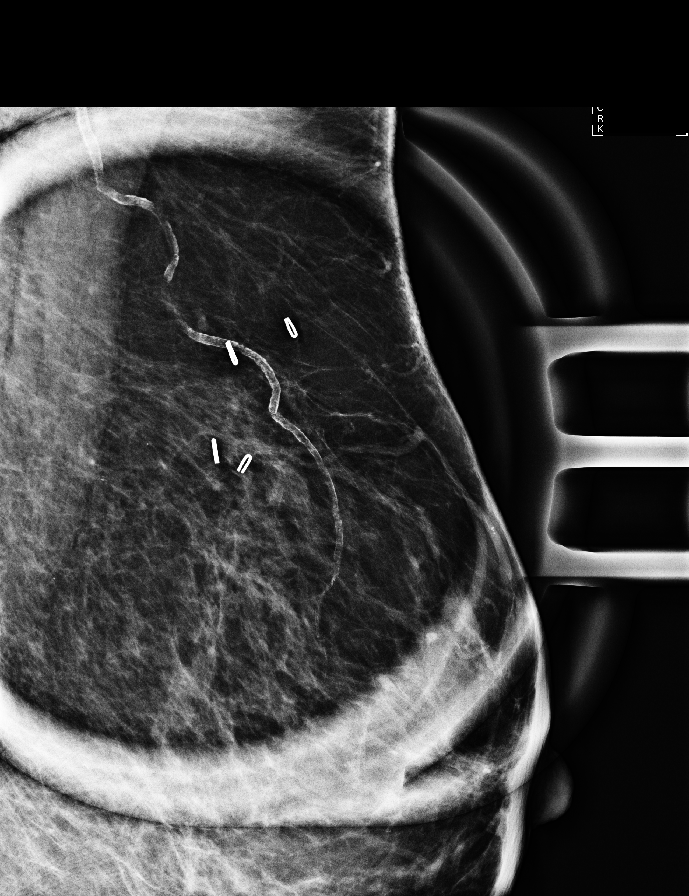

[R CC synth-2D (1 of 2)]
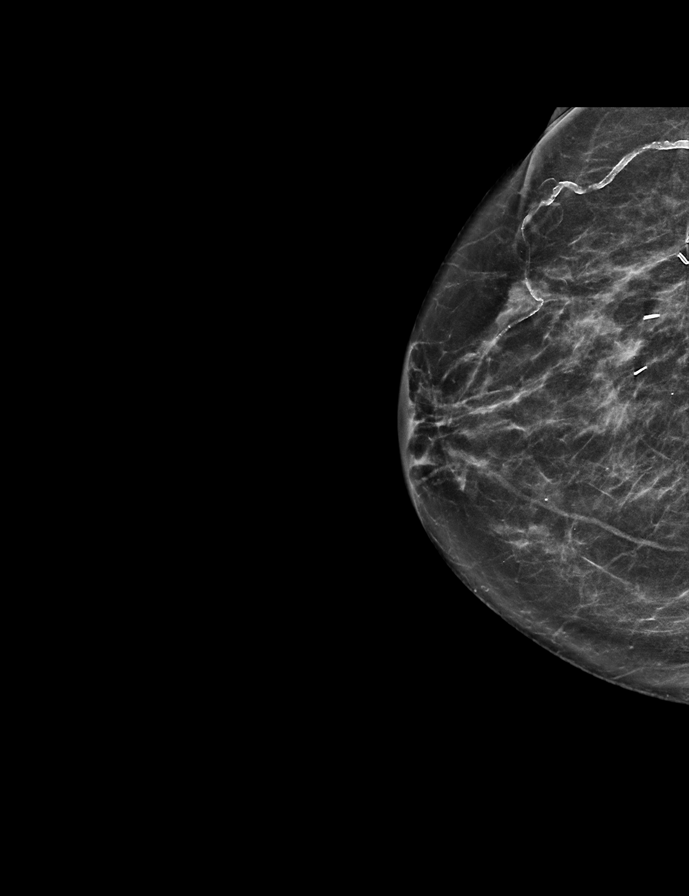

[L MLO synth-2D]
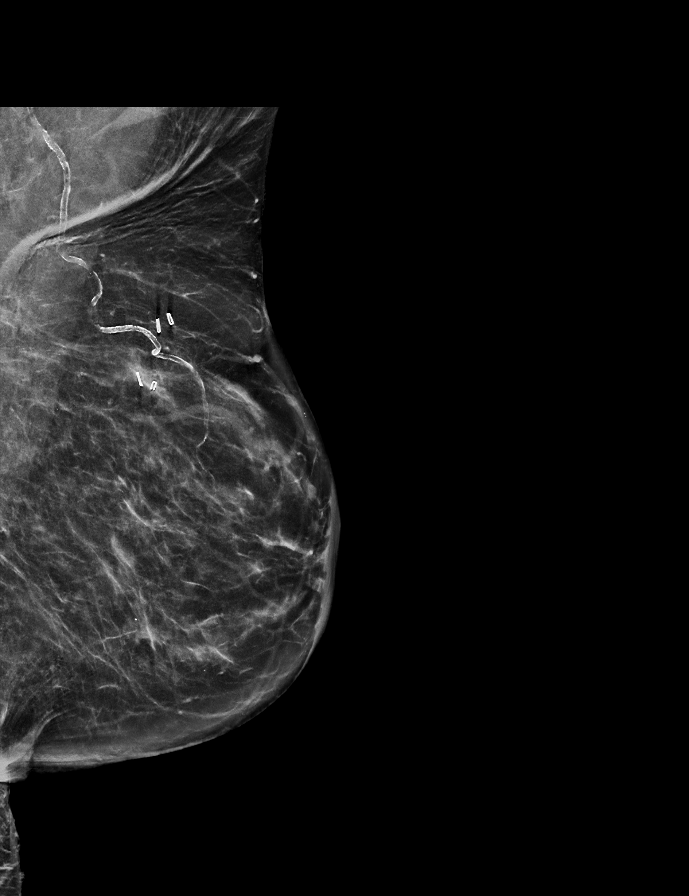

[R CC synth-2D (2 of 2)]
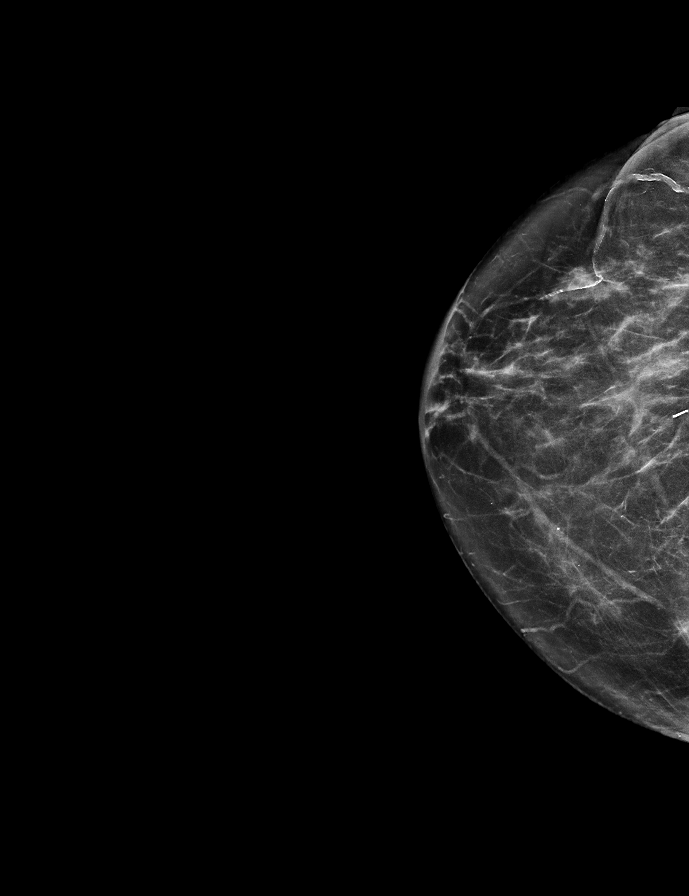

[L CC synth-2D]
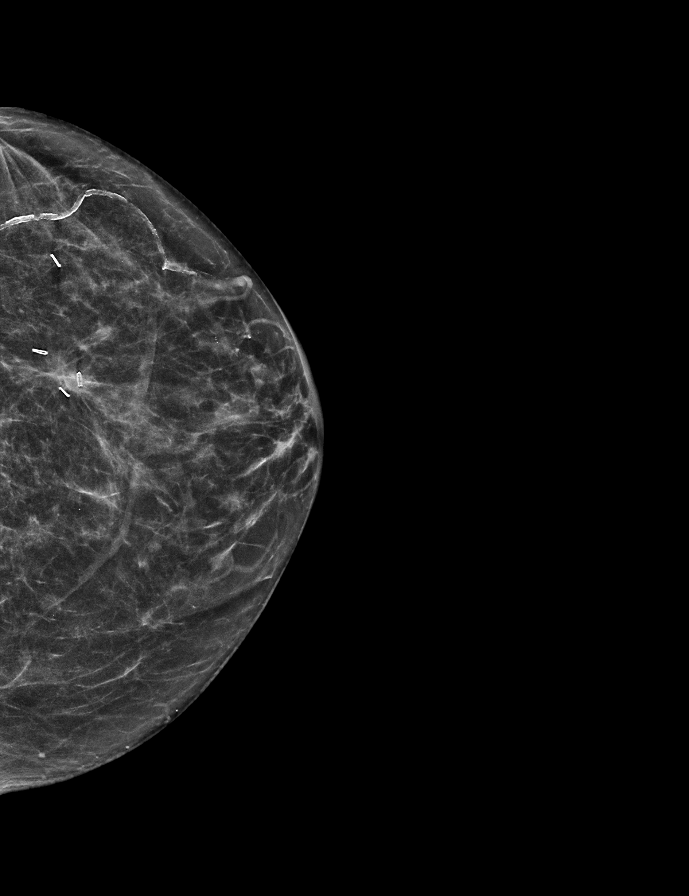

[R MLO synth-2D]
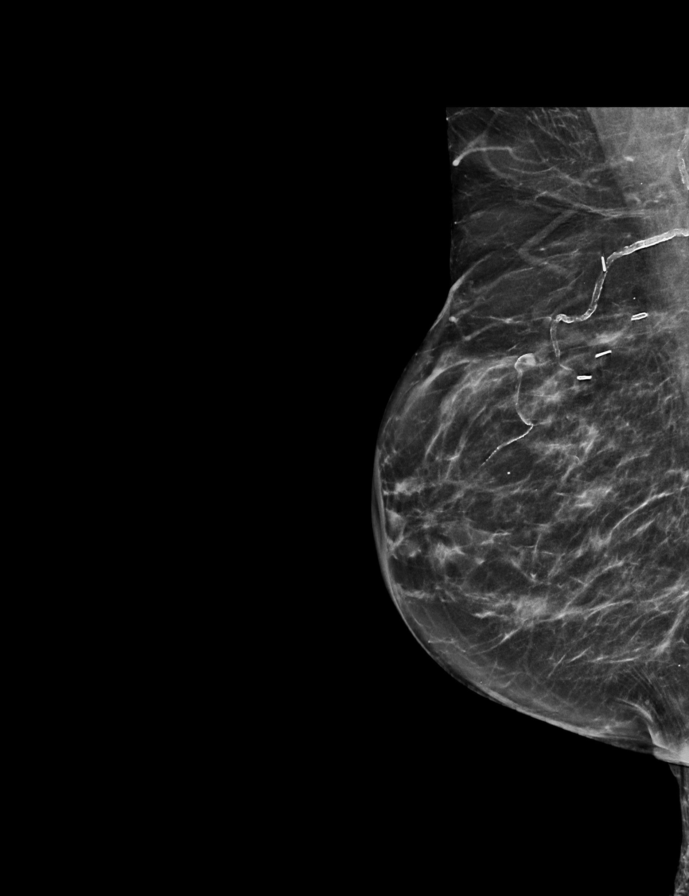

[R CC tomo · 2 of 60 frames shown]
[frame 20/60]
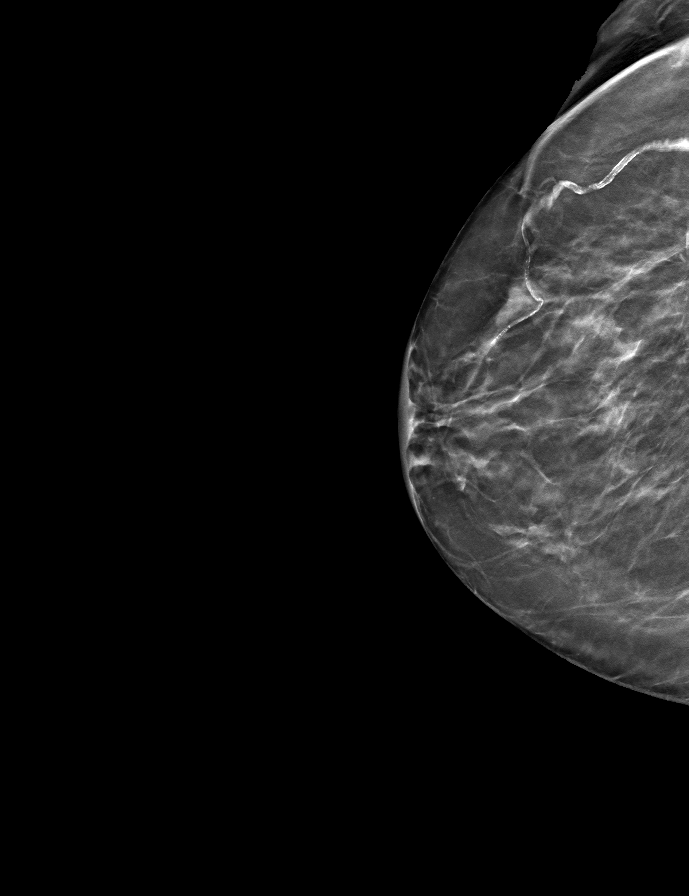
[frame 31/60]
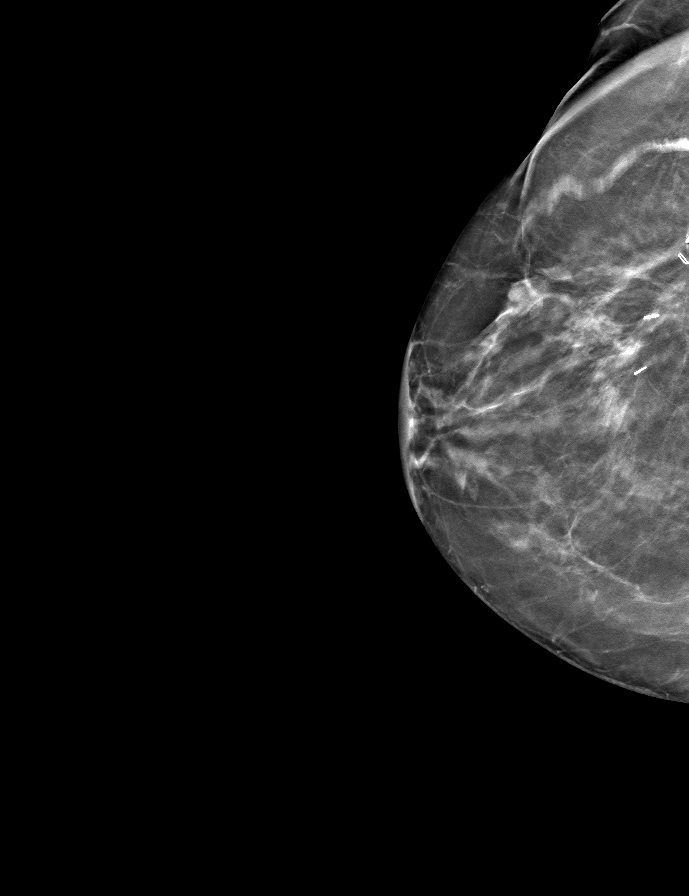

[9 of 32 positions shown; findings below may reference images not displayed]

ACR Breast Density Category b: There are scattered areas of
fibroglandular density.
FINDINGS: The bilateral lumpectomy sites in the upper outer quadrants are
stable. No suspicious calcifications, masses or areas of distortion
are seen in the bilateral breasts.

Mammographic images were processed with CAD.
IMPRESSION: 1. Stable bilateral breast lumpectomy sites. No mammographic
evidence of malignancy in the bilateral breasts.

RECOMMENDATION:
Diagnostic mammogram is suggested in 1 year. (Code:GL-H-DCO)

I have discussed the findings and recommendations with the patient.
If applicable, a reminder letter will be sent to the patient
regarding the next appointment.

BI-RADS CATEGORY  2: Benign.

## 2021-04-27 DIAGNOSIS — E1122 Type 2 diabetes mellitus with diabetic chronic kidney disease: Secondary | ICD-10-CM | POA: Diagnosis not present

## 2021-04-27 DIAGNOSIS — E11628 Type 2 diabetes mellitus with other skin complications: Secondary | ICD-10-CM | POA: Diagnosis not present

## 2021-04-27 DIAGNOSIS — L03115 Cellulitis of right lower limb: Secondary | ICD-10-CM | POA: Diagnosis not present

## 2021-04-27 DIAGNOSIS — I509 Heart failure, unspecified: Secondary | ICD-10-CM | POA: Diagnosis not present

## 2021-04-27 DIAGNOSIS — I130 Hypertensive heart and chronic kidney disease with heart failure and stage 1 through stage 4 chronic kidney disease, or unspecified chronic kidney disease: Secondary | ICD-10-CM | POA: Diagnosis not present

## 2021-04-27 DIAGNOSIS — N1831 Chronic kidney disease, stage 3a: Secondary | ICD-10-CM | POA: Diagnosis not present

## 2021-05-01 DIAGNOSIS — L03115 Cellulitis of right lower limb: Secondary | ICD-10-CM | POA: Diagnosis not present

## 2021-05-01 DIAGNOSIS — E1122 Type 2 diabetes mellitus with diabetic chronic kidney disease: Secondary | ICD-10-CM | POA: Diagnosis not present

## 2021-05-01 DIAGNOSIS — I130 Hypertensive heart and chronic kidney disease with heart failure and stage 1 through stage 4 chronic kidney disease, or unspecified chronic kidney disease: Secondary | ICD-10-CM | POA: Diagnosis not present

## 2021-05-01 DIAGNOSIS — N1831 Chronic kidney disease, stage 3a: Secondary | ICD-10-CM | POA: Diagnosis not present

## 2021-05-01 DIAGNOSIS — I509 Heart failure, unspecified: Secondary | ICD-10-CM | POA: Diagnosis not present

## 2021-05-01 DIAGNOSIS — E11628 Type 2 diabetes mellitus with other skin complications: Secondary | ICD-10-CM | POA: Diagnosis not present

## 2021-05-01 IMAGING — MR MR HEAD WO/W CM
13 series · 48 of 48 positions shown · IV contrast (17ml Multihance)
Comparison: MRI head 10/05/2013.  CT head 10/04/2018

CLINICAL DATA: History of breast cancer. Blurred vision confusion
tremors gait disturbance. Numbness and weakness arms and legs.

EXAM:
MRI HEAD WITHOUT AND WITH CONTRAST
TECHNIQUE: Multiplanar, multiecho pulse sequences of the brain and surrounding
structures were obtained without and with intravenous contrast.
CONTRAST:  17mL MULTIHANCE GADOBENATE DIMEGLUMINE 529 MG/ML IV SOLN

[Series 2: T1 · sagittal · 5.0mm · 0.45mm/px · 2 of 23 slices shown]
[im 1/23]
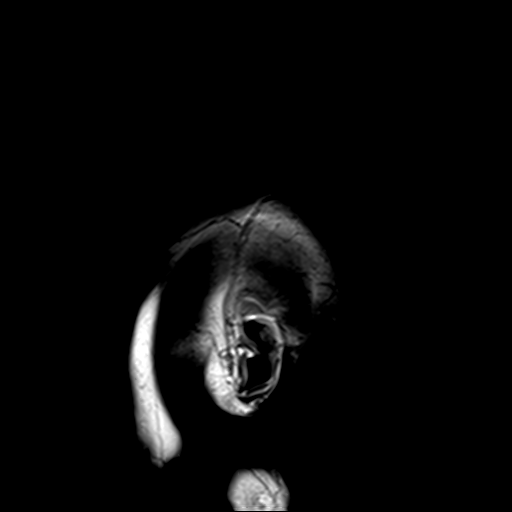
[im 23/23]
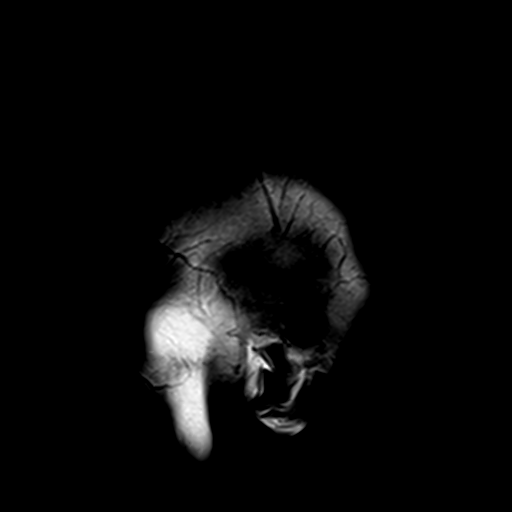

[Series 3: DWI · axial · 3.0mm · 1.80mm/px · z∈[-43,+104]mm · 7 of 100 slices shown (1 of 4)]
[im 1/100]
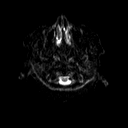
[im 17/100]
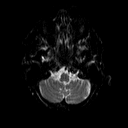
[im 34/100]
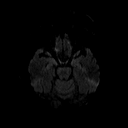
[im 50/100]
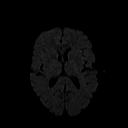
[im 67/100]
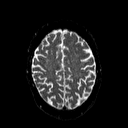
[im 83/100]
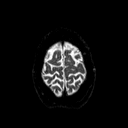
[im 100/100]
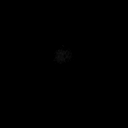

[Series 4: DWI · axial · 3.0mm · 1.80mm/px · z∈[-43,+104]mm · 3 of 44 slices shown (2 of 4)]
[im 1/44]
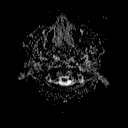
[im 22/44]
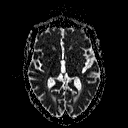
[im 44/44]
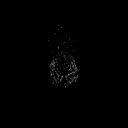

[Series 5: DWI · coronal · 5.0mm · 1.80mm/px · 5 of 72 slices shown (3 of 4)]
[im 1/72]
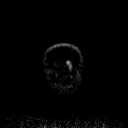
[im 18/72]
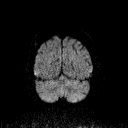
[im 36/72]
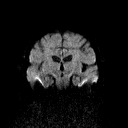
[im 54/72]
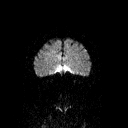
[im 72/72]
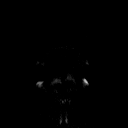

[Series 6: DWI · coronal · 5.0mm · 1.80mm/px · 2 of 36 slices shown (4 of 4)]
[im 1/36]
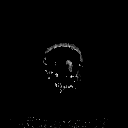
[im 36/36]
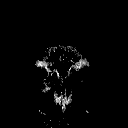

[Series 7: T2 · axial · 5.0mm · 0.72mm/px · 1 of 22 slices shown (1 of 2)]
[im 1/22]
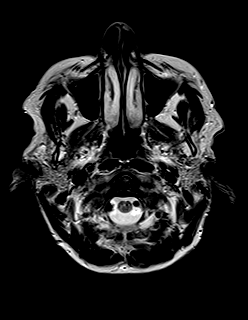

[Series 8: FLAIR · axial · 3.0mm · 0.45mm/px · z∈[-41,+103]mm · 2 of 32 slices shown]
[im 1/32]
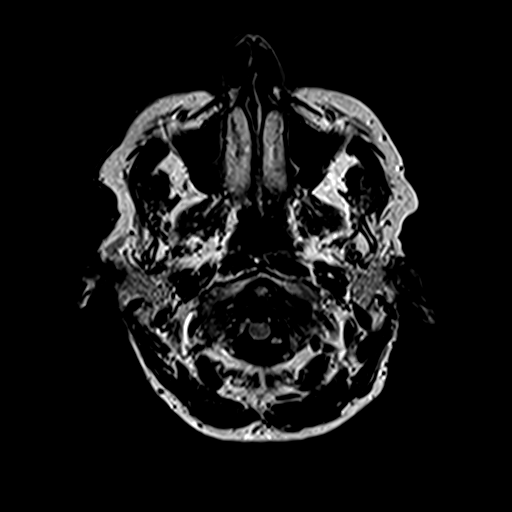
[im 32/32]
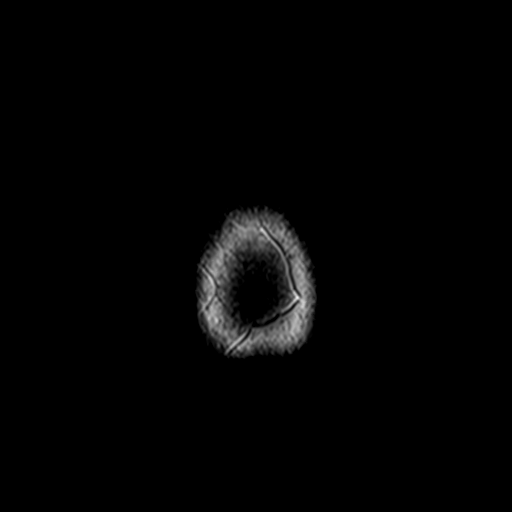

[Series 10: swi_images · axial · 4.0mm · 0.90mm/px · z∈[-39,+101]mm · 2 of 36 slices shown]
[im 1/36]
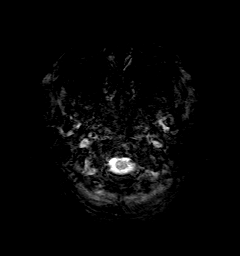
[im 36/36]
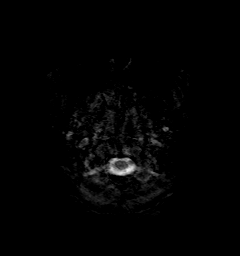

[Series 11: t1_mpr_tra · axial · 1.0mm · 0.75mm/px · z∈[-41,+102]mm · 9 of 144 slices shown (1 of 2)]
[im 1/144]
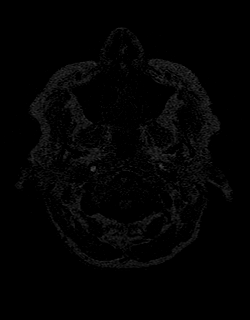
[im 18/144]
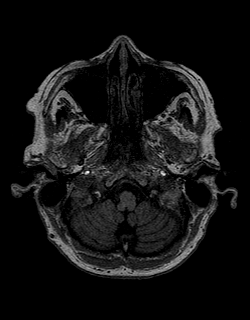
[im 36/144]
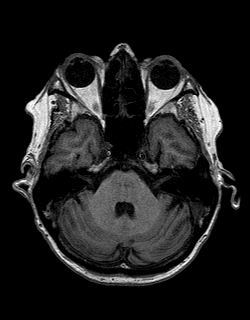
[im 54/144]
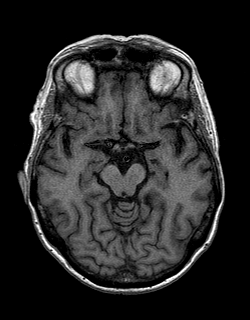
[im 72/144]
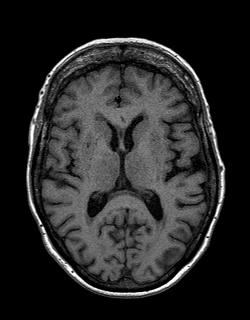
[im 90/144]
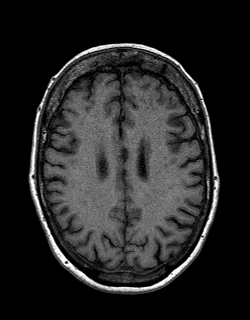
[im 108/144]
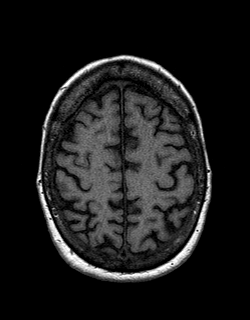
[im 126/144]
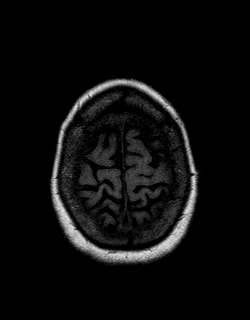
[im 144/144]
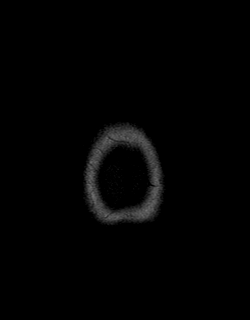

[Series 12: T2 · coronal · 5.0mm · 0.45mm/px · 2 of 25 slices shown (2 of 2)]
[im 1/25]
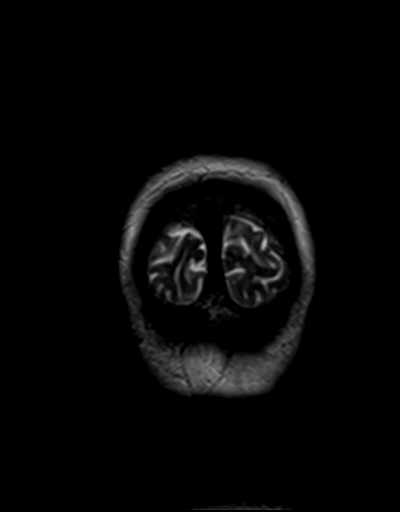
[im 25/25]
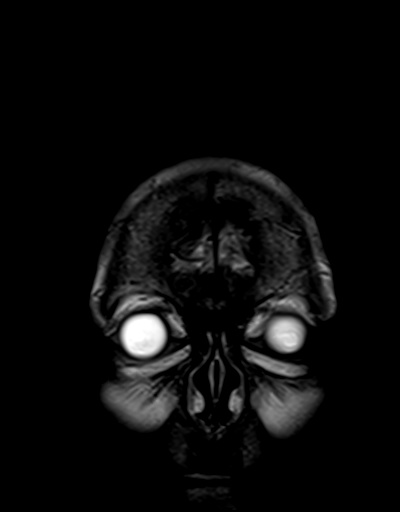

[Series 13: t1_mpr_tra · axial · 1.0mm · 0.75mm/px · z∈[-41,+102]mm · 9 of 144 slices shown (2 of 2)]
[im 1/144]
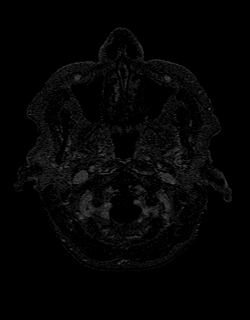
[im 18/144]
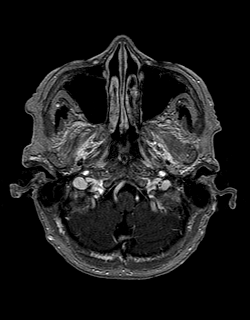
[im 36/144]
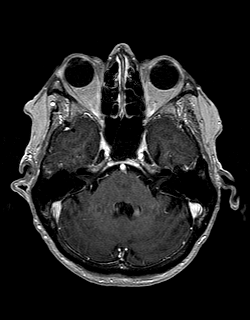
[im 54/144]
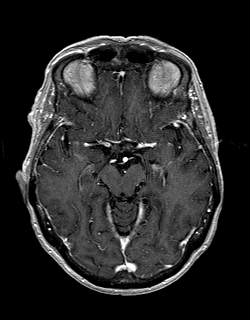
[im 72/144]
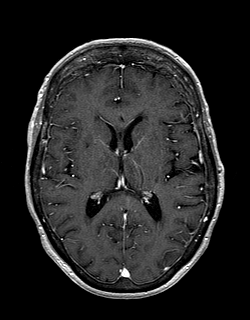
[im 90/144]
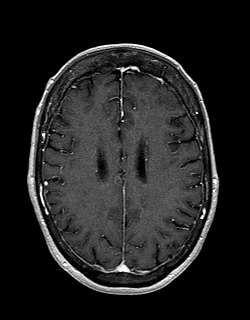
[im 108/144]
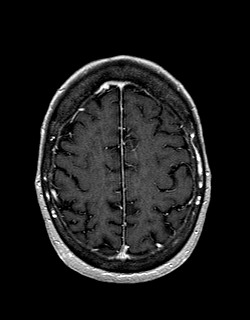
[im 126/144]
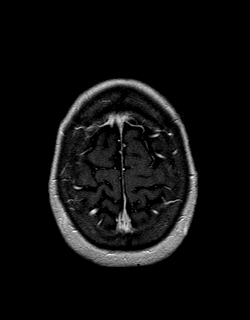
[im 144/144]
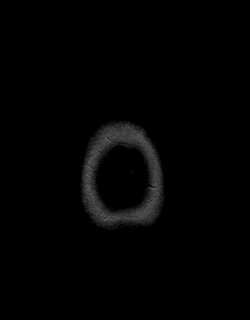

[Series 14: post cor · coronal · 5.0mm · 0.45mm/px · 2 of 27 slices shown]
[im 1/27]
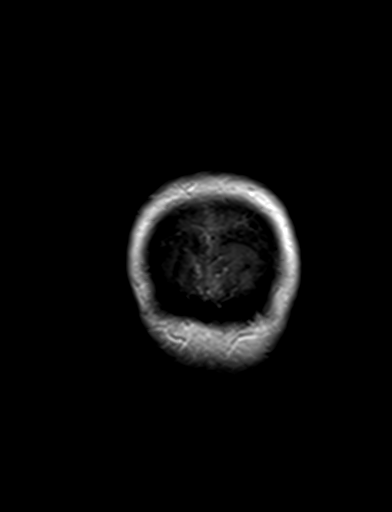
[im 27/27]
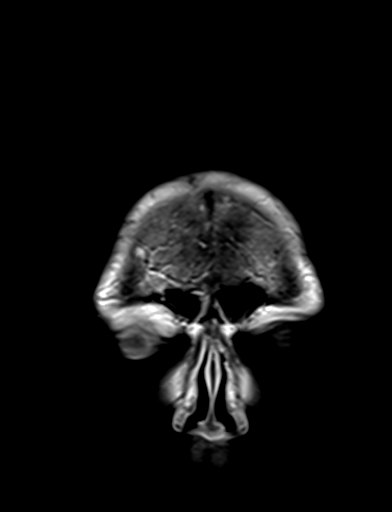

[Series 15: post sag (optional · sagittal · 5.0mm · 0.45mm/px · 2 of 23 slices shown]
[im 1/23]
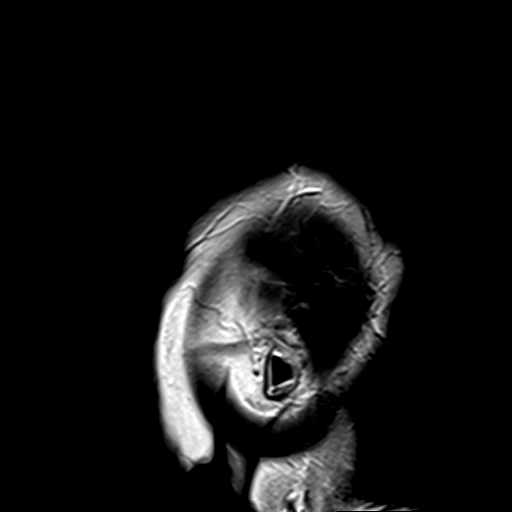
[im 23/23]
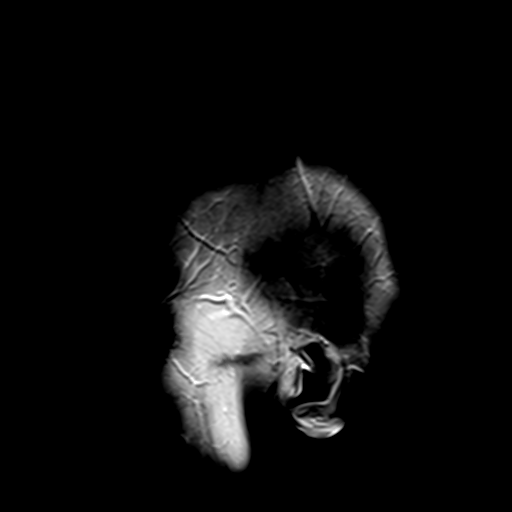

[48 of 48 positions shown; findings below may reference images not displayed]

FINDINGS: Brain: Ventricle size and cerebral volume normal for age. Negative
for acute infarct. Negative for hemorrhage or mass

Scattered white matter hyperintensities and hyperintensity in the
pons have progressed since 7422.

Normal enhancement following contrast infusion. Negative for
metastatic disease.

Vascular: Normal arterial flow voids

Skull and upper cervical spine: No focal skeletal lesion.

Sinuses/Orbits: Paranasal sinuses clear. Bilateral cataract
extraction

Other: None
IMPRESSION: Moderate chronic microvascular ischemic change in the white matter
and pons with progression since 7422

Negative for metastatic disease

No acute abnormality.

## 2021-05-04 DIAGNOSIS — E11628 Type 2 diabetes mellitus with other skin complications: Secondary | ICD-10-CM | POA: Diagnosis not present

## 2021-05-04 DIAGNOSIS — N1831 Chronic kidney disease, stage 3a: Secondary | ICD-10-CM | POA: Diagnosis not present

## 2021-05-04 DIAGNOSIS — L03115 Cellulitis of right lower limb: Secondary | ICD-10-CM | POA: Diagnosis not present

## 2021-05-04 DIAGNOSIS — I130 Hypertensive heart and chronic kidney disease with heart failure and stage 1 through stage 4 chronic kidney disease, or unspecified chronic kidney disease: Secondary | ICD-10-CM | POA: Diagnosis not present

## 2021-05-04 DIAGNOSIS — E1122 Type 2 diabetes mellitus with diabetic chronic kidney disease: Secondary | ICD-10-CM | POA: Diagnosis not present

## 2021-05-04 DIAGNOSIS — I509 Heart failure, unspecified: Secondary | ICD-10-CM | POA: Diagnosis not present

## 2021-05-05 DIAGNOSIS — N1831 Chronic kidney disease, stage 3a: Secondary | ICD-10-CM | POA: Diagnosis not present

## 2021-05-05 DIAGNOSIS — I130 Hypertensive heart and chronic kidney disease with heart failure and stage 1 through stage 4 chronic kidney disease, or unspecified chronic kidney disease: Secondary | ICD-10-CM | POA: Diagnosis not present

## 2021-05-05 DIAGNOSIS — E11628 Type 2 diabetes mellitus with other skin complications: Secondary | ICD-10-CM | POA: Diagnosis not present

## 2021-05-05 DIAGNOSIS — I509 Heart failure, unspecified: Secondary | ICD-10-CM | POA: Diagnosis not present

## 2021-05-05 DIAGNOSIS — L03115 Cellulitis of right lower limb: Secondary | ICD-10-CM | POA: Diagnosis not present

## 2021-05-05 DIAGNOSIS — E1122 Type 2 diabetes mellitus with diabetic chronic kidney disease: Secondary | ICD-10-CM | POA: Diagnosis not present

## 2021-05-06 ENCOUNTER — Ambulatory Visit: Payer: Medicare Other

## 2021-05-07 DIAGNOSIS — I130 Hypertensive heart and chronic kidney disease with heart failure and stage 1 through stage 4 chronic kidney disease, or unspecified chronic kidney disease: Secondary | ICD-10-CM | POA: Diagnosis not present

## 2021-05-07 DIAGNOSIS — L03115 Cellulitis of right lower limb: Secondary | ICD-10-CM | POA: Diagnosis not present

## 2021-05-07 DIAGNOSIS — E1122 Type 2 diabetes mellitus with diabetic chronic kidney disease: Secondary | ICD-10-CM | POA: Diagnosis not present

## 2021-05-07 DIAGNOSIS — I509 Heart failure, unspecified: Secondary | ICD-10-CM | POA: Diagnosis not present

## 2021-05-07 DIAGNOSIS — E11628 Type 2 diabetes mellitus with other skin complications: Secondary | ICD-10-CM | POA: Diagnosis not present

## 2021-05-07 DIAGNOSIS — N1831 Chronic kidney disease, stage 3a: Secondary | ICD-10-CM | POA: Diagnosis not present

## 2021-05-08 ENCOUNTER — Ambulatory Visit: Payer: Medicare Other | Admitting: Cardiology

## 2021-05-12 ENCOUNTER — Telehealth: Payer: Self-pay

## 2021-05-12 ENCOUNTER — Ambulatory Visit
Admission: RE | Admit: 2021-05-12 | Discharge: 2021-05-12 | Disposition: A | Discharge status: Home / Self Care | Payer: Medicare Other | Source: Ambulatory Visit | Attending: Family Medicine | Admitting: Family Medicine

## 2021-05-12 ENCOUNTER — Other Ambulatory Visit: Payer: Self-pay | Admitting: Family Medicine

## 2021-05-12 ENCOUNTER — Other Ambulatory Visit: Payer: Self-pay | Admitting: *Deleted

## 2021-05-12 DIAGNOSIS — I509 Heart failure, unspecified: Secondary | ICD-10-CM | POA: Diagnosis not present

## 2021-05-12 DIAGNOSIS — E1122 Type 2 diabetes mellitus with diabetic chronic kidney disease: Secondary | ICD-10-CM | POA: Diagnosis not present

## 2021-05-12 DIAGNOSIS — E11610 Type 2 diabetes mellitus with diabetic neuropathic arthropathy: Secondary | ICD-10-CM | POA: Diagnosis not present

## 2021-05-12 DIAGNOSIS — E11628 Type 2 diabetes mellitus with other skin complications: Secondary | ICD-10-CM | POA: Diagnosis not present

## 2021-05-12 DIAGNOSIS — N1831 Chronic kidney disease, stage 3a: Secondary | ICD-10-CM | POA: Diagnosis not present

## 2021-05-12 DIAGNOSIS — R509 Fever, unspecified: Secondary | ICD-10-CM | POA: Diagnosis not present

## 2021-05-12 DIAGNOSIS — R0602 Shortness of breath: Secondary | ICD-10-CM | POA: Diagnosis not present

## 2021-05-12 DIAGNOSIS — N1832 Chronic kidney disease, stage 3b: Secondary | ICD-10-CM | POA: Diagnosis not present

## 2021-05-12 DIAGNOSIS — N644 Mastodynia: Secondary | ICD-10-CM

## 2021-05-12 DIAGNOSIS — Z79899 Other long term (current) drug therapy: Secondary | ICD-10-CM | POA: Diagnosis not present

## 2021-05-12 DIAGNOSIS — E663 Overweight: Secondary | ICD-10-CM | POA: Diagnosis not present

## 2021-05-12 DIAGNOSIS — R2681 Unsteadiness on feet: Secondary | ICD-10-CM | POA: Diagnosis not present

## 2021-05-12 DIAGNOSIS — L03115 Cellulitis of right lower limb: Secondary | ICD-10-CM | POA: Diagnosis not present

## 2021-05-12 DIAGNOSIS — I5032 Chronic diastolic (congestive) heart failure: Secondary | ICD-10-CM | POA: Diagnosis not present

## 2021-05-12 DIAGNOSIS — I130 Hypertensive heart and chronic kidney disease with heart failure and stage 1 through stage 4 chronic kidney disease, or unspecified chronic kidney disease: Secondary | ICD-10-CM | POA: Diagnosis not present

## 2021-05-12 DIAGNOSIS — C50919 Malignant neoplasm of unspecified site of unspecified female breast: Secondary | ICD-10-CM | POA: Diagnosis not present

## 2021-05-12 DIAGNOSIS — F0390 Unspecified dementia without behavioral disturbance: Secondary | ICD-10-CM | POA: Diagnosis not present

## 2021-05-12 DIAGNOSIS — I82402 Acute embolism and thrombosis of unspecified deep veins of left lower extremity: Secondary | ICD-10-CM | POA: Diagnosis not present

## 2021-05-12 NOTE — Telephone Encounter (Signed)
Pt's daughter called stating her mom has a knot/enlarged gland on right breast.  Pt's daughter stated it is tender to touch, red in color, but denies signs of infection.  Pt denied any fevers.  Pt has hx of bilateral breast cancer.  Pt and daughter would like for Dr Burr Medico to give them a call or possibly schedule an appt to see [name] some time this week.

## 2021-05-12 NOTE — Telephone Encounter (Signed)
Pt's daughter just called back stating that the pt now has an elevated temperature of 100.7'F.  Pt's daughter wants to know what should they do.  Spoke with [name] regarding new development of elevated temperature.  [name] recommends that the pt takes a COVID test to r/o Covid and to follow-up with her PCP since the pt is not actively taking chemotherapy.  Informed pt's daughter of Dr Ernestina Penna recommendation.  Pt's daughter stated she will test the pt for COVID and will contact the pt's PCP.  Informed pt's daughter please contact Dr. Ernestina Penna office should they cannot connect with the pt's PCP.  Pt's daughter verbalized understanding and had no further questions or concerns at this time.

## 2021-05-14 DIAGNOSIS — E1122 Type 2 diabetes mellitus with diabetic chronic kidney disease: Secondary | ICD-10-CM | POA: Diagnosis not present

## 2021-05-14 DIAGNOSIS — N1831 Chronic kidney disease, stage 3a: Secondary | ICD-10-CM | POA: Diagnosis not present

## 2021-05-14 DIAGNOSIS — I130 Hypertensive heart and chronic kidney disease with heart failure and stage 1 through stage 4 chronic kidney disease, or unspecified chronic kidney disease: Secondary | ICD-10-CM | POA: Diagnosis not present

## 2021-05-14 DIAGNOSIS — L03115 Cellulitis of right lower limb: Secondary | ICD-10-CM | POA: Diagnosis not present

## 2021-05-14 DIAGNOSIS — I509 Heart failure, unspecified: Secondary | ICD-10-CM | POA: Diagnosis not present

## 2021-05-14 DIAGNOSIS — E11628 Type 2 diabetes mellitus with other skin complications: Secondary | ICD-10-CM | POA: Diagnosis not present

## 2021-05-15 DIAGNOSIS — J449 Chronic obstructive pulmonary disease, unspecified: Secondary | ICD-10-CM | POA: Diagnosis not present

## 2021-05-15 DIAGNOSIS — I25118 Atherosclerotic heart disease of native coronary artery with other forms of angina pectoris: Secondary | ICD-10-CM | POA: Diagnosis not present

## 2021-05-15 DIAGNOSIS — I130 Hypertensive heart and chronic kidney disease with heart failure and stage 1 through stage 4 chronic kidney disease, or unspecified chronic kidney disease: Secondary | ICD-10-CM | POA: Diagnosis not present

## 2021-05-15 DIAGNOSIS — I700 Atherosclerosis of aorta: Secondary | ICD-10-CM | POA: Diagnosis not present

## 2021-05-15 DIAGNOSIS — I82402 Acute embolism and thrombosis of unspecified deep veins of left lower extremity: Secondary | ICD-10-CM | POA: Diagnosis not present

## 2021-05-15 DIAGNOSIS — E663 Overweight: Secondary | ICD-10-CM | POA: Diagnosis not present

## 2021-05-15 DIAGNOSIS — D6859 Other primary thrombophilia: Secondary | ICD-10-CM | POA: Diagnosis not present

## 2021-05-15 DIAGNOSIS — F0390 Unspecified dementia without behavioral disturbance: Secondary | ICD-10-CM | POA: Diagnosis not present

## 2021-05-15 DIAGNOSIS — C50919 Malignant neoplasm of unspecified site of unspecified female breast: Secondary | ICD-10-CM | POA: Diagnosis not present

## 2021-05-15 DIAGNOSIS — I5032 Chronic diastolic (congestive) heart failure: Secondary | ICD-10-CM | POA: Diagnosis not present

## 2021-05-15 DIAGNOSIS — E11610 Type 2 diabetes mellitus with diabetic neuropathic arthropathy: Secondary | ICD-10-CM | POA: Diagnosis not present

## 2021-05-15 DIAGNOSIS — I771 Stricture of artery: Secondary | ICD-10-CM | POA: Diagnosis not present

## 2021-05-18 ENCOUNTER — Other Ambulatory Visit: Payer: Self-pay | Admitting: Student

## 2021-05-18 DIAGNOSIS — J449 Chronic obstructive pulmonary disease, unspecified: Secondary | ICD-10-CM | POA: Diagnosis not present

## 2021-05-18 DIAGNOSIS — N644 Mastodynia: Secondary | ICD-10-CM

## 2021-05-18 DIAGNOSIS — C50011 Malignant neoplasm of nipple and areola, right female breast: Secondary | ICD-10-CM | POA: Diagnosis not present

## 2021-05-18 DIAGNOSIS — E1100 Type 2 diabetes mellitus with hyperosmolarity without nonketotic hyperglycemic-hyperosmolar coma (NKHHC): Secondary | ICD-10-CM | POA: Diagnosis not present

## 2021-05-18 DIAGNOSIS — I509 Heart failure, unspecified: Secondary | ICD-10-CM | POA: Diagnosis not present

## 2021-05-18 DIAGNOSIS — Z515 Encounter for palliative care: Secondary | ICD-10-CM | POA: Diagnosis not present

## 2021-05-18 DIAGNOSIS — N184 Chronic kidney disease, stage 4 (severe): Secondary | ICD-10-CM | POA: Diagnosis not present

## 2021-05-18 DIAGNOSIS — F329 Major depressive disorder, single episode, unspecified: Secondary | ICD-10-CM | POA: Diagnosis not present

## 2021-05-18 DIAGNOSIS — I119 Hypertensive heart disease without heart failure: Secondary | ICD-10-CM | POA: Diagnosis not present

## 2021-05-19 DIAGNOSIS — E1140 Type 2 diabetes mellitus with diabetic neuropathy, unspecified: Secondary | ICD-10-CM | POA: Diagnosis not present

## 2021-05-19 DIAGNOSIS — M25512 Pain in left shoulder: Secondary | ICD-10-CM | POA: Diagnosis not present

## 2021-05-19 DIAGNOSIS — F0394 Unspecified dementia, unspecified severity, with anxiety: Secondary | ICD-10-CM | POA: Diagnosis not present

## 2021-05-19 DIAGNOSIS — E11610 Type 2 diabetes mellitus with diabetic neuropathic arthropathy: Secondary | ICD-10-CM | POA: Diagnosis not present

## 2021-05-19 DIAGNOSIS — N1831 Chronic kidney disease, stage 3a: Secondary | ICD-10-CM | POA: Diagnosis not present

## 2021-05-19 DIAGNOSIS — Z7902 Long term (current) use of antithrombotics/antiplatelets: Secondary | ICD-10-CM | POA: Diagnosis not present

## 2021-05-19 DIAGNOSIS — E1122 Type 2 diabetes mellitus with diabetic chronic kidney disease: Secondary | ICD-10-CM | POA: Diagnosis not present

## 2021-05-19 DIAGNOSIS — Z794 Long term (current) use of insulin: Secondary | ICD-10-CM | POA: Diagnosis not present

## 2021-05-19 DIAGNOSIS — I82432 Acute embolism and thrombosis of left popliteal vein: Secondary | ICD-10-CM | POA: Diagnosis not present

## 2021-05-19 DIAGNOSIS — G2581 Restless legs syndrome: Secondary | ICD-10-CM | POA: Diagnosis not present

## 2021-05-19 DIAGNOSIS — I2510 Atherosclerotic heart disease of native coronary artery without angina pectoris: Secondary | ICD-10-CM | POA: Diagnosis not present

## 2021-05-19 DIAGNOSIS — I130 Hypertensive heart and chronic kidney disease with heart failure and stage 1 through stage 4 chronic kidney disease, or unspecified chronic kidney disease: Secondary | ICD-10-CM | POA: Diagnosis not present

## 2021-05-19 DIAGNOSIS — I509 Heart failure, unspecified: Secondary | ICD-10-CM | POA: Diagnosis not present

## 2021-05-19 DIAGNOSIS — G8929 Other chronic pain: Secondary | ICD-10-CM | POA: Diagnosis not present

## 2021-05-19 DIAGNOSIS — Z7901 Long term (current) use of anticoagulants: Secondary | ICD-10-CM | POA: Diagnosis not present

## 2021-05-19 DIAGNOSIS — E11628 Type 2 diabetes mellitus with other skin complications: Secondary | ICD-10-CM | POA: Diagnosis not present

## 2021-05-19 DIAGNOSIS — L03115 Cellulitis of right lower limb: Secondary | ICD-10-CM | POA: Diagnosis not present

## 2021-05-19 DIAGNOSIS — Z853 Personal history of malignant neoplasm of breast: Secondary | ICD-10-CM | POA: Diagnosis not present

## 2021-05-21 ENCOUNTER — Ambulatory Visit: Payer: Medicare Other

## 2021-05-21 IMAGING — CR DG HUMERUS 2V *L*
3 series · 3 of 3 positions shown · non-contrast
Comparison: November 26, 2019.

CLINICAL DATA: Left arm pain after fall.

EXAM:
LEFT HUMERUS - 2+ VIEW

[humerus ap (1 of 2)]
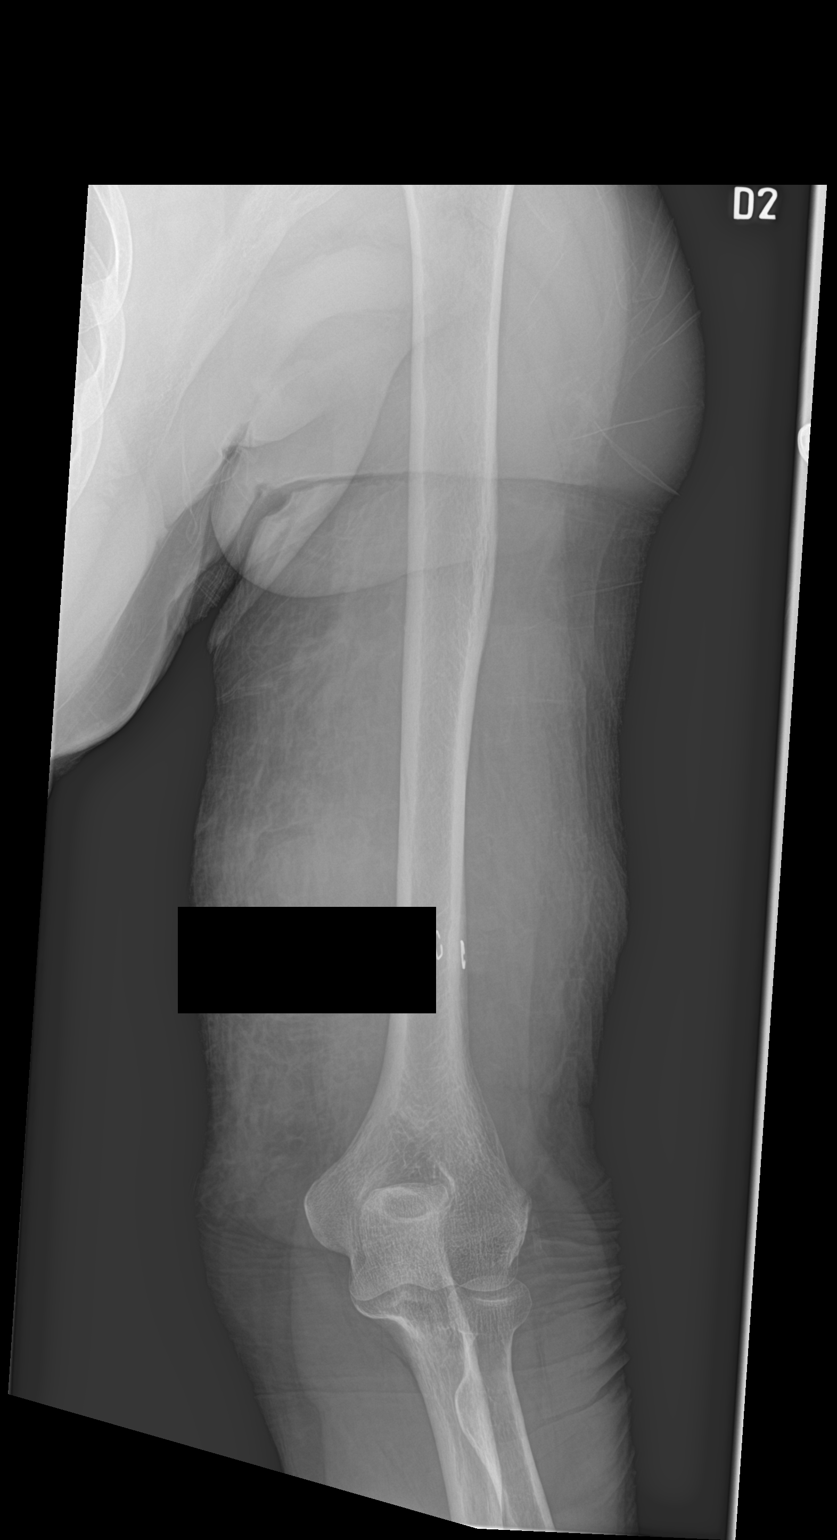

[humerus lat]
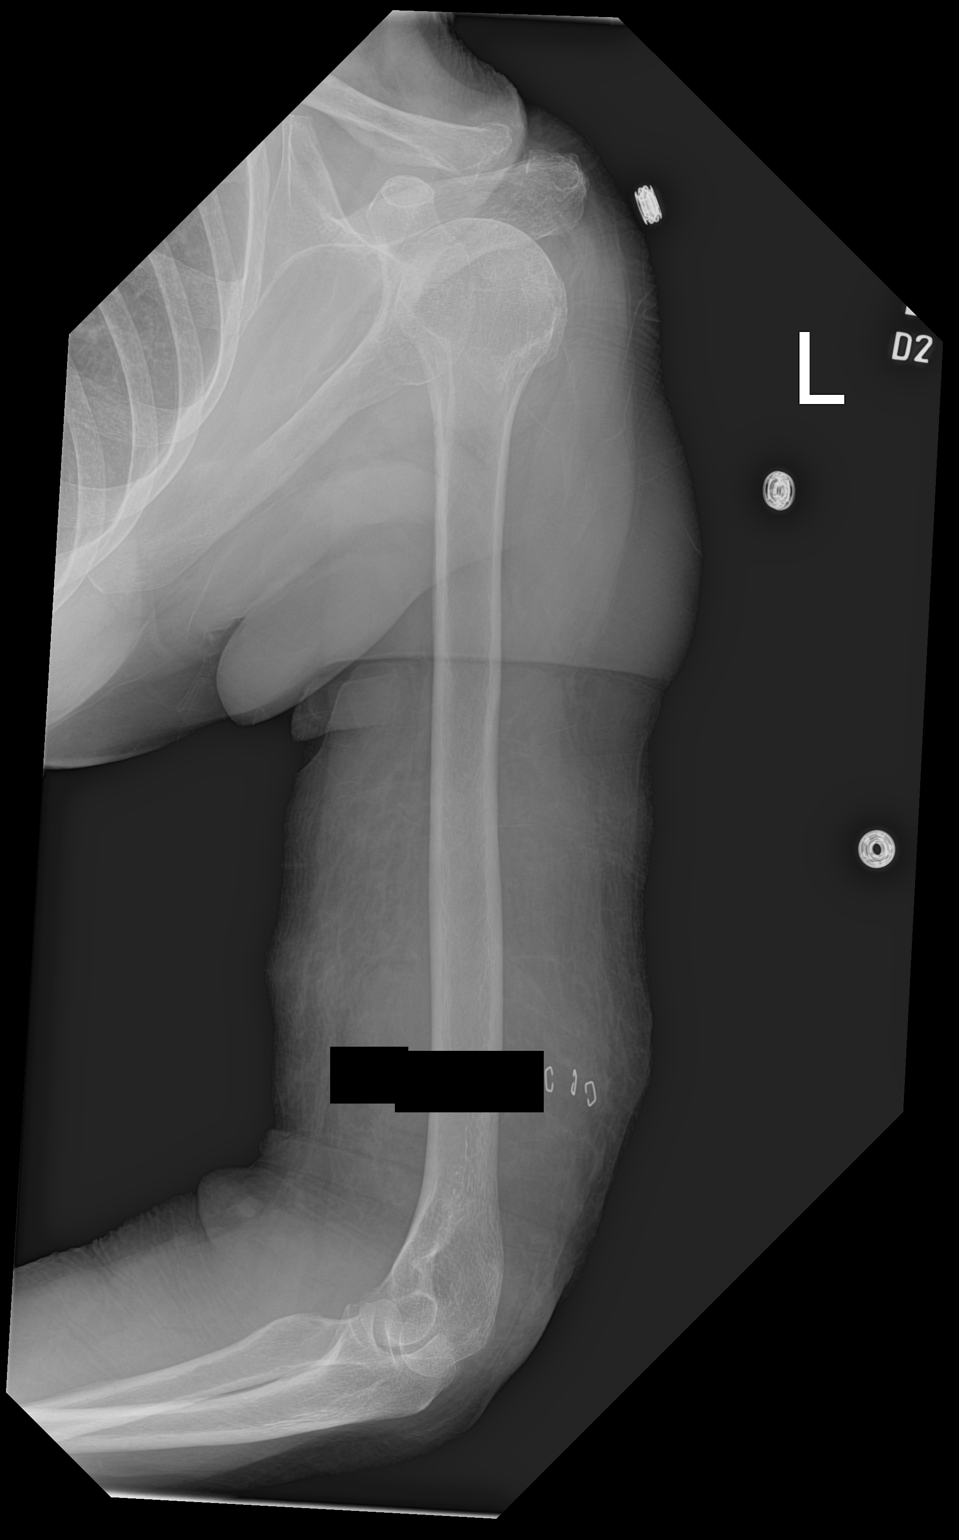

[humerus ap (2 of 2)]
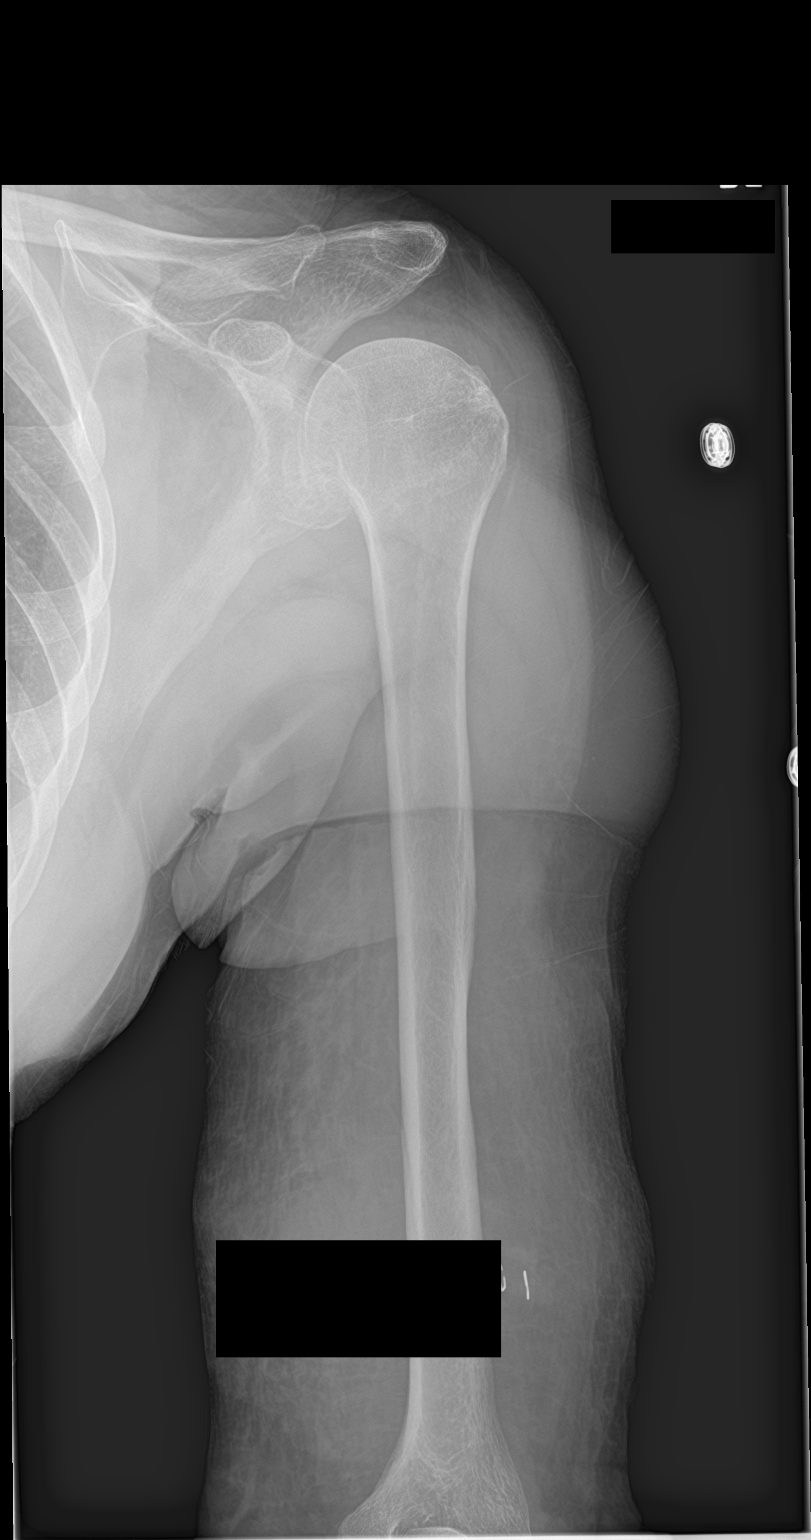

[3 of 3 positions shown; findings below may reference images not displayed]

FINDINGS: There is no evidence of fracture or other focal bone lesions. Soft
tissues are unremarkable.
IMPRESSION: Negative.

## 2021-05-21 IMAGING — CT CT HEAD W/O CM
4 series · 15 of 47 positions shown, 17 images · non-contrast
Comparison: October 04, 2018.

CLINICAL DATA: Head laceration after fall.

EXAM:
CT HEAD WITHOUT CONTRAST
TECHNIQUE: Contiguous axial images were obtained from the base of the skull
through the vertex without intravenous contrast.

[Series 3: head wo · axial · 0.32mm/px · z∈[-185,-68]mm · 7 of 34 slices shown, 9 images]
[im 5/34  brain]
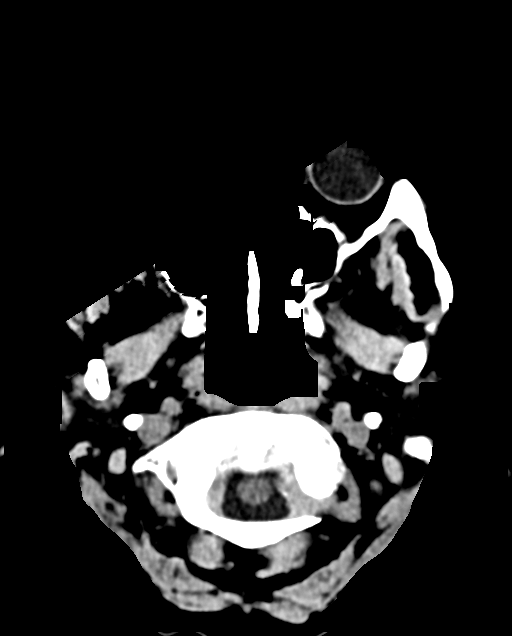
[im 5/34  bone]
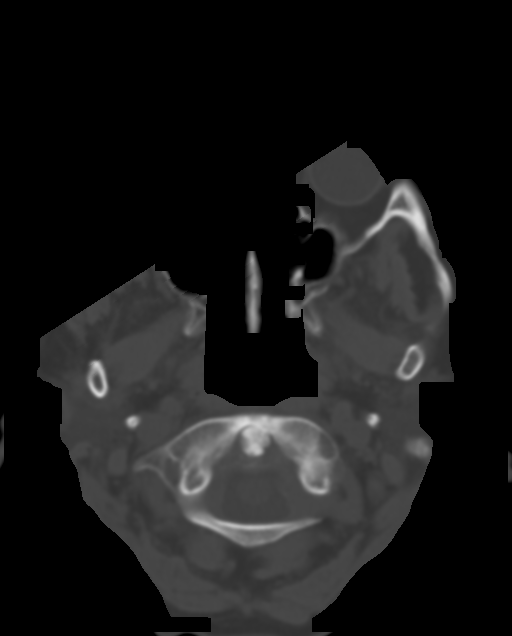
[im 9/34  brain]
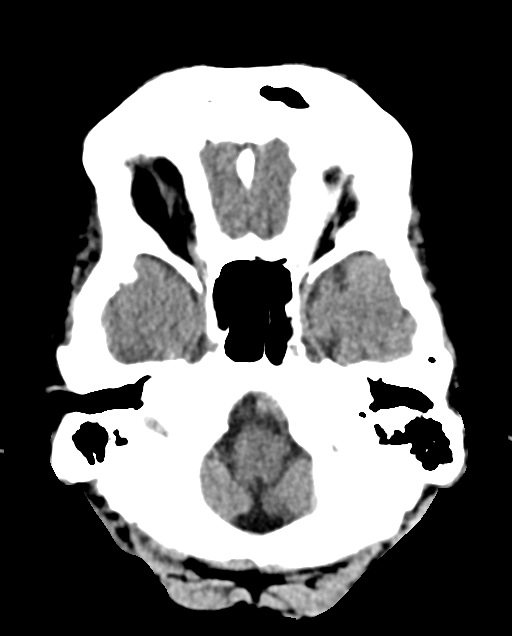
[im 13/34  brain]
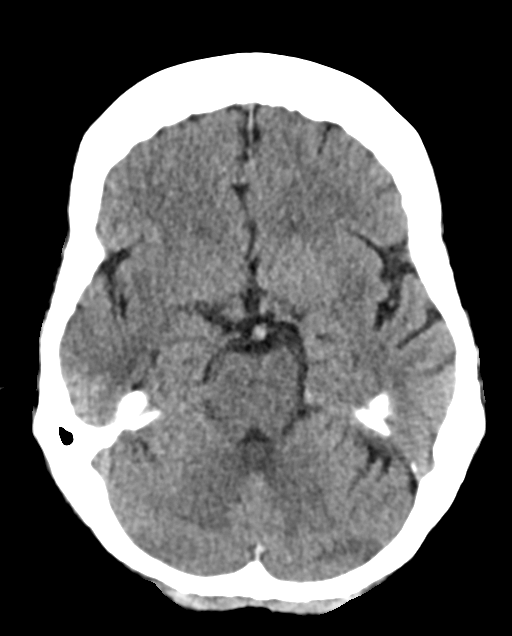
[im 17/34  brain]
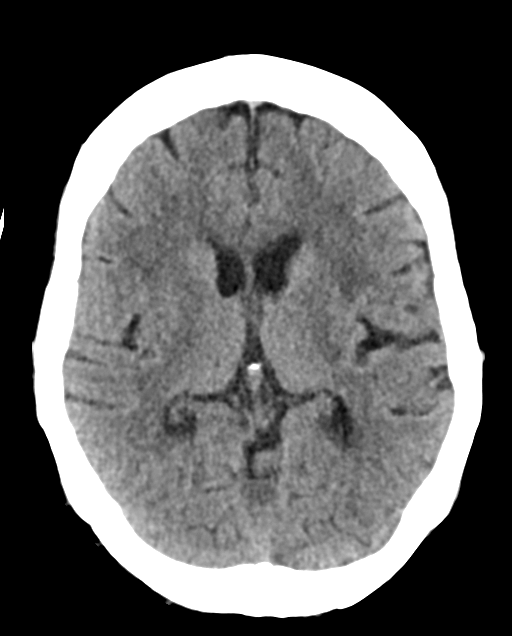
[im 21/34  brain]
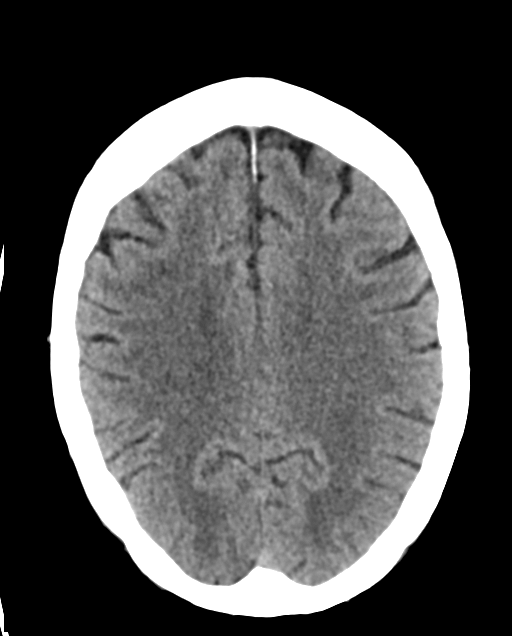
[im 21/34  bone]
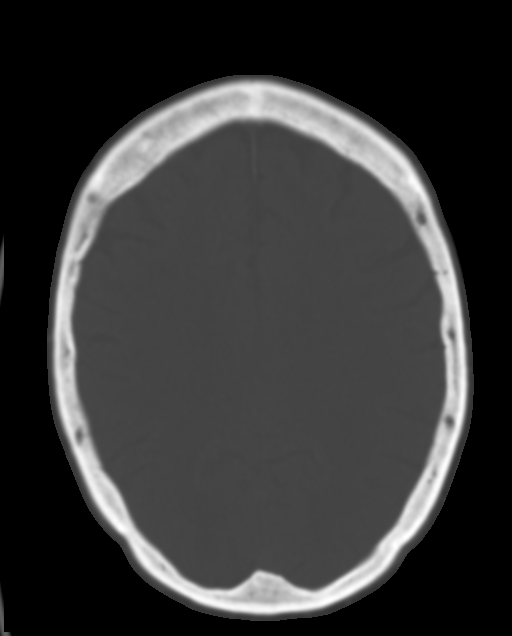
[im 25/34  brain]
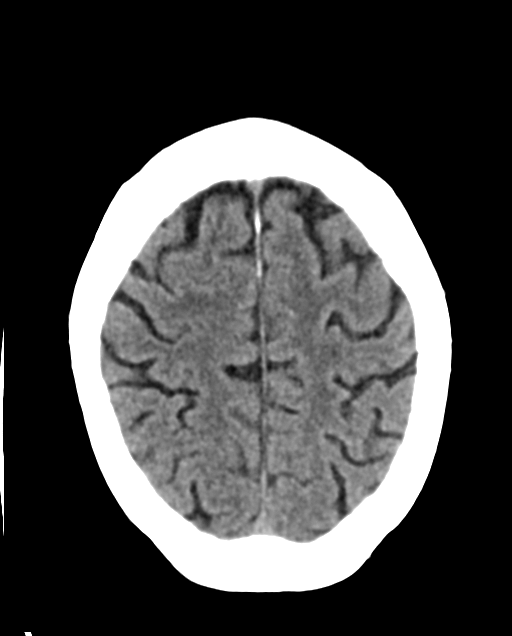
[im 29/34  brain]
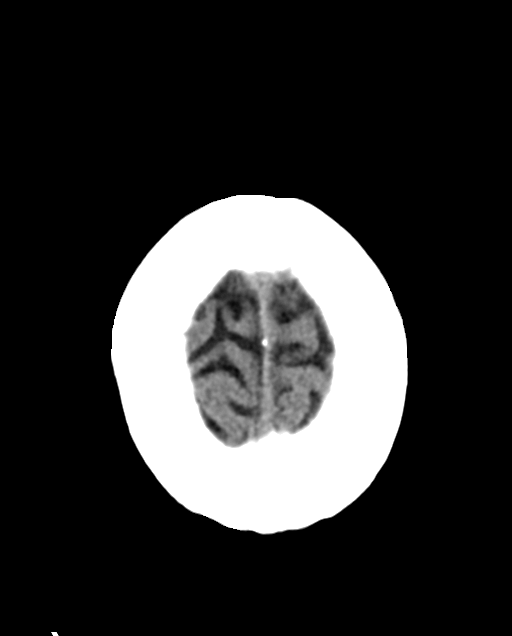

[Series 4: head bone · axial · 0.39mm/px · z∈[-144,-128]mm · 2 of 78 slices shown]
[im 8/78  bone]
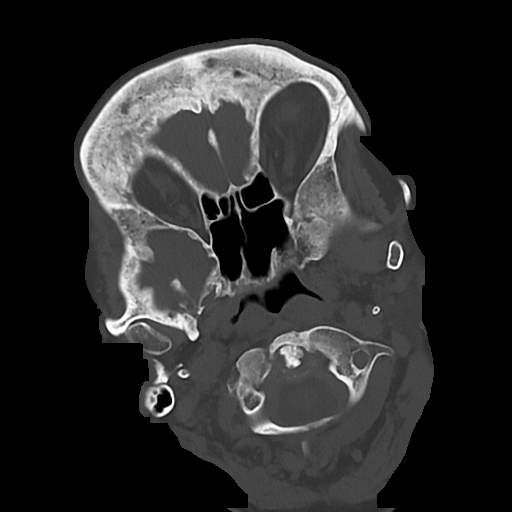
[im 16/78  bone]
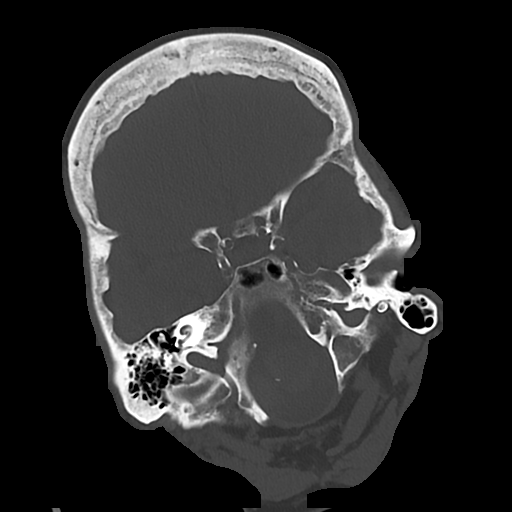

[Series 5: cor soft · coronal · 0.33mm/px · 3 of 65 slices shown]
[im 22/65  brain]
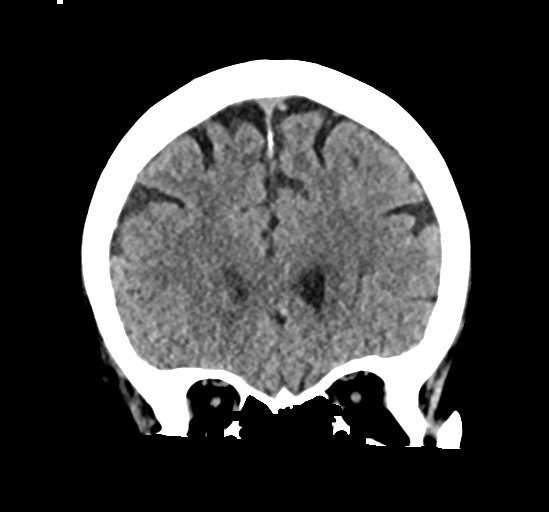
[im 29/65  brain]
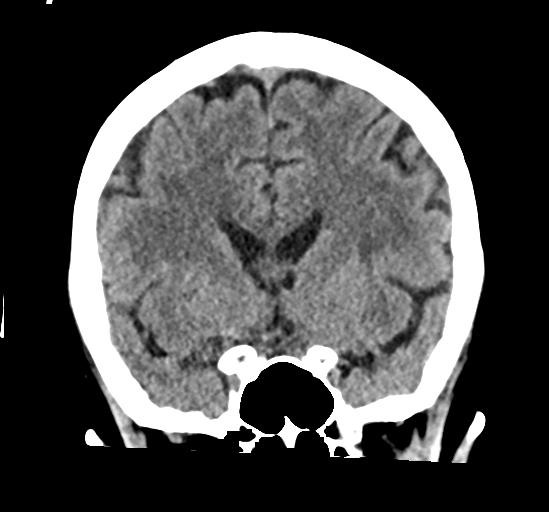
[im 36/65  brain]
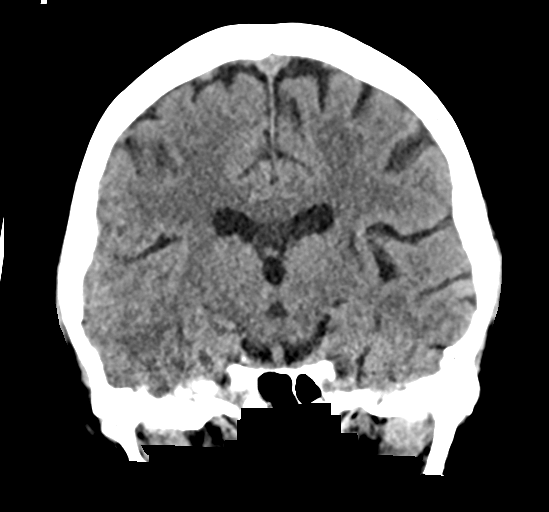

[Series 6: sag soft · sagittal · 0.33mm/px · 3 of 64 slices shown]
[im 22/64  brain]
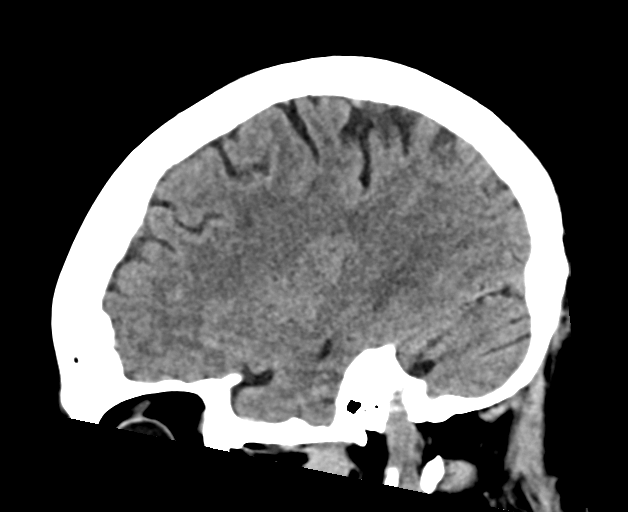
[im 32/64  brain]
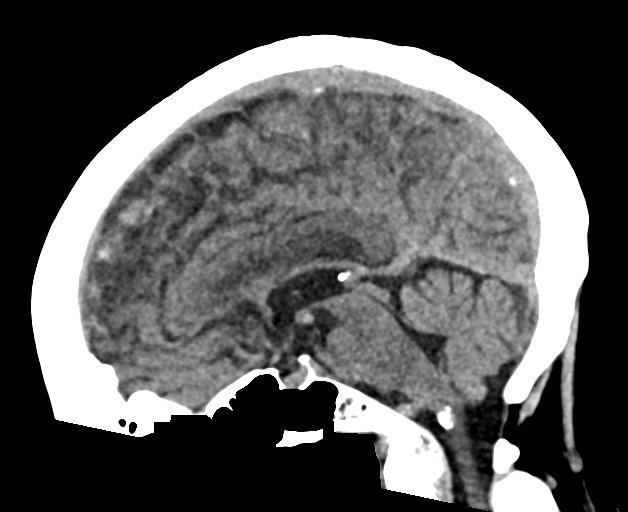
[im 43/64  brain]
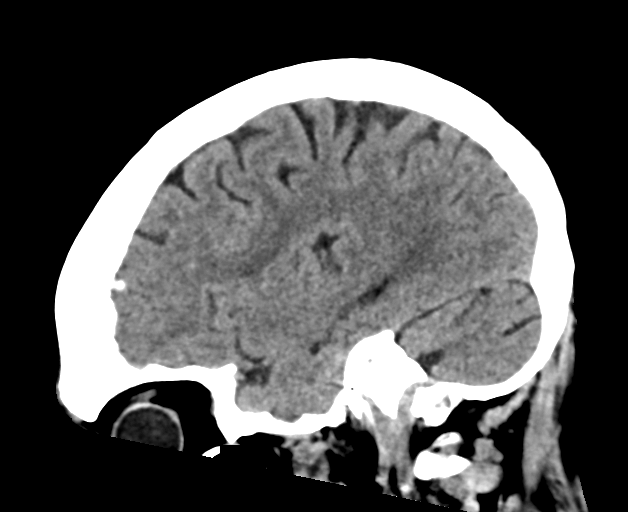

[15 of 47 positions shown; findings below may reference images not displayed]

FINDINGS: Brain: Mild chronic ischemic white matter disease is noted. No mass
effect or midline shift is noted. Ventricular size is within normal
limits. There is no evidence of mass lesion, hemorrhage or acute
infarction.

Vascular: No hyperdense vessel or unexpected calcification.

Skull: Normal. Negative for fracture or focal lesion.

Sinuses/Orbits: No acute finding.

Other: None.
IMPRESSION: Mild chronic ischemic white matter disease. No acute intracranial
abnormality seen.

## 2021-05-21 IMAGING — CR DG ELBOW COMPLETE 3+V*L*
4 series · 4 of 4 positions shown · non-contrast
Comparison: None.

CLINICAL DATA: Left arm pain after fall.

EXAM:
LEFT ELBOW - COMPLETE 3+ VIEW

[elbow ap]
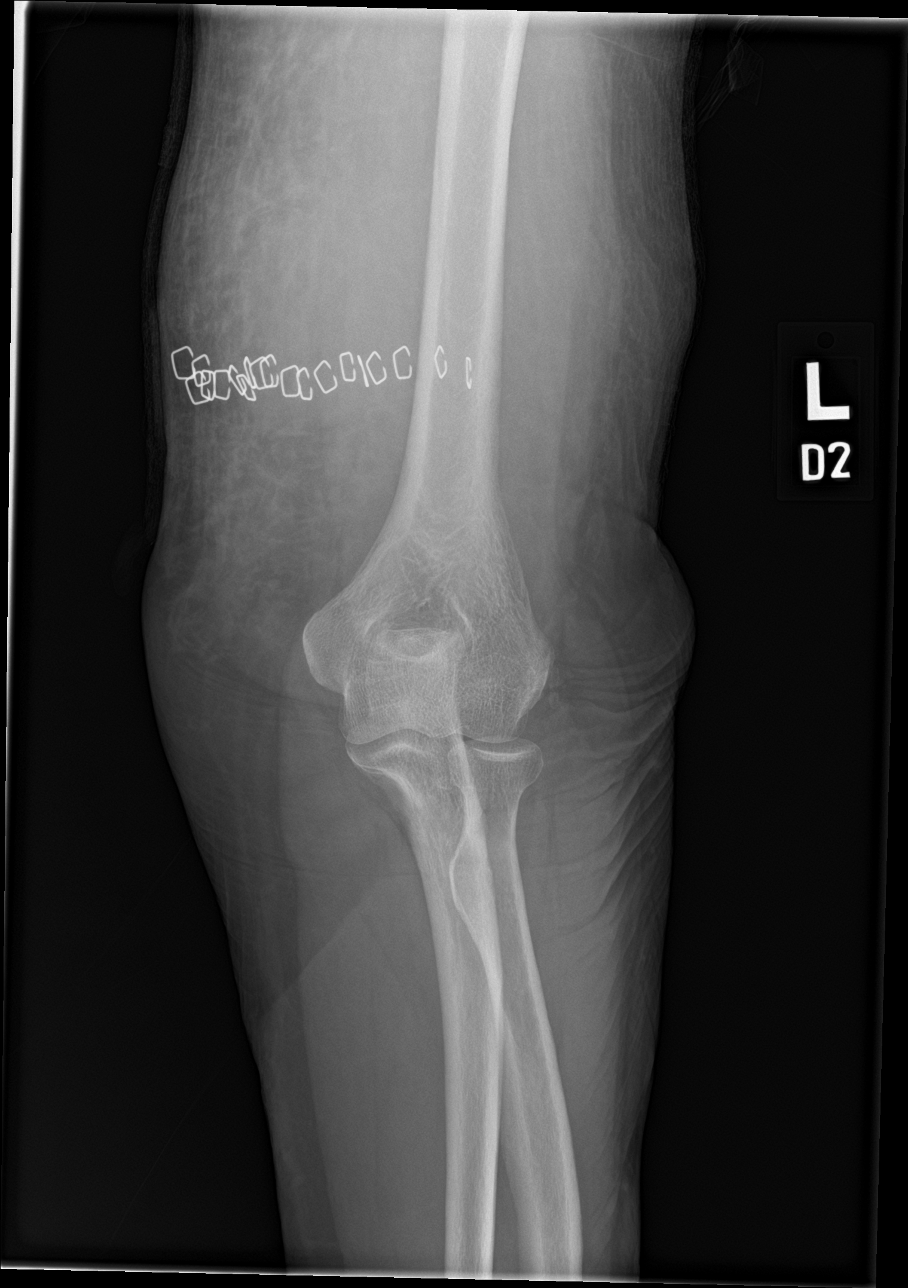

[elbow obl (1 of 2)]
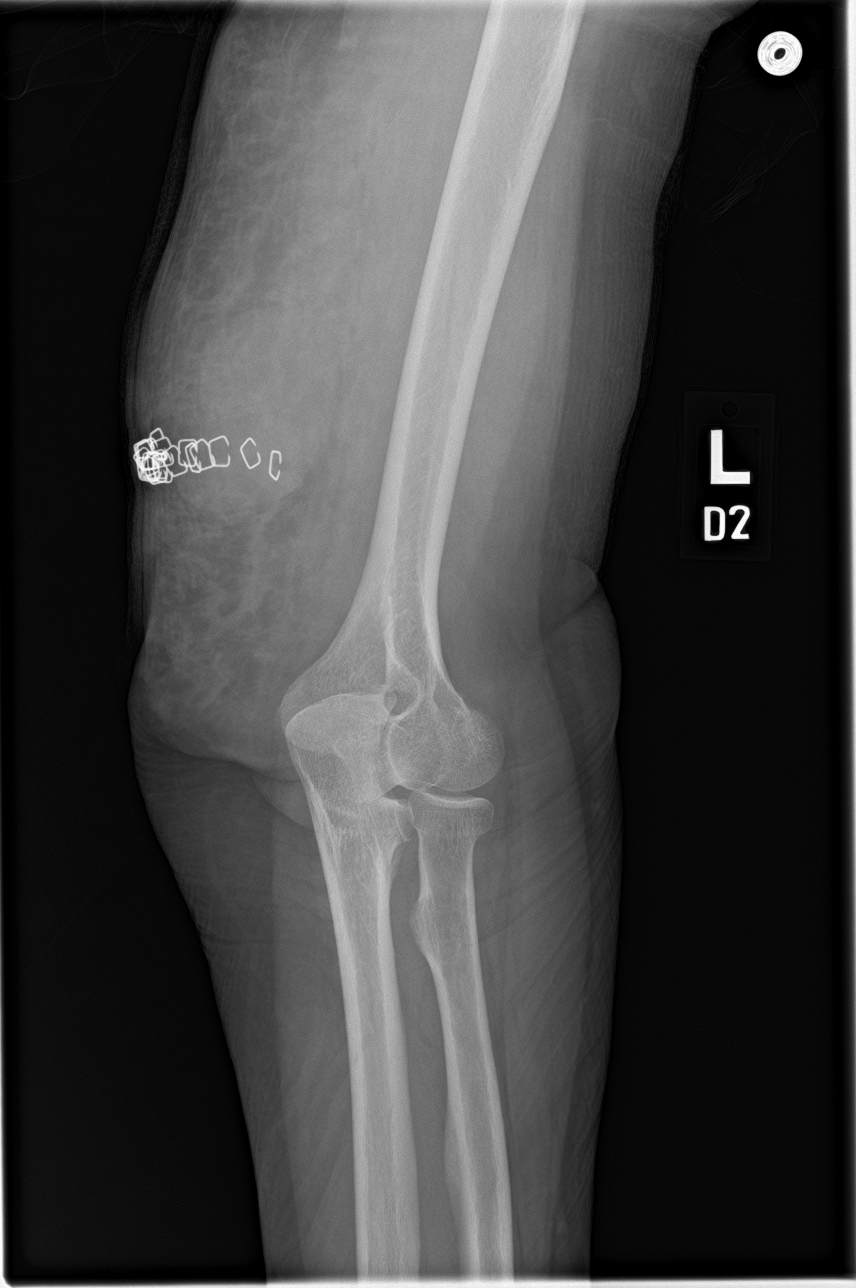

[elbow obl (2 of 2)]
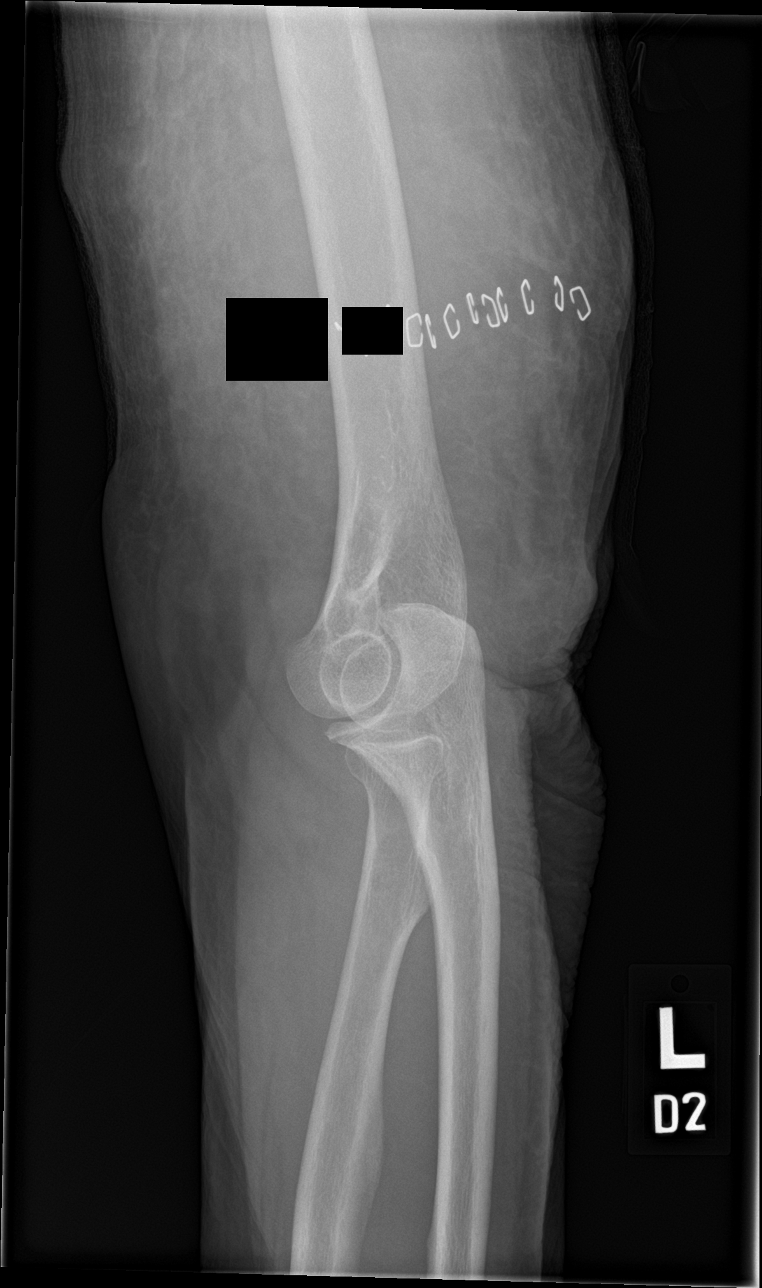

[elbow lat]
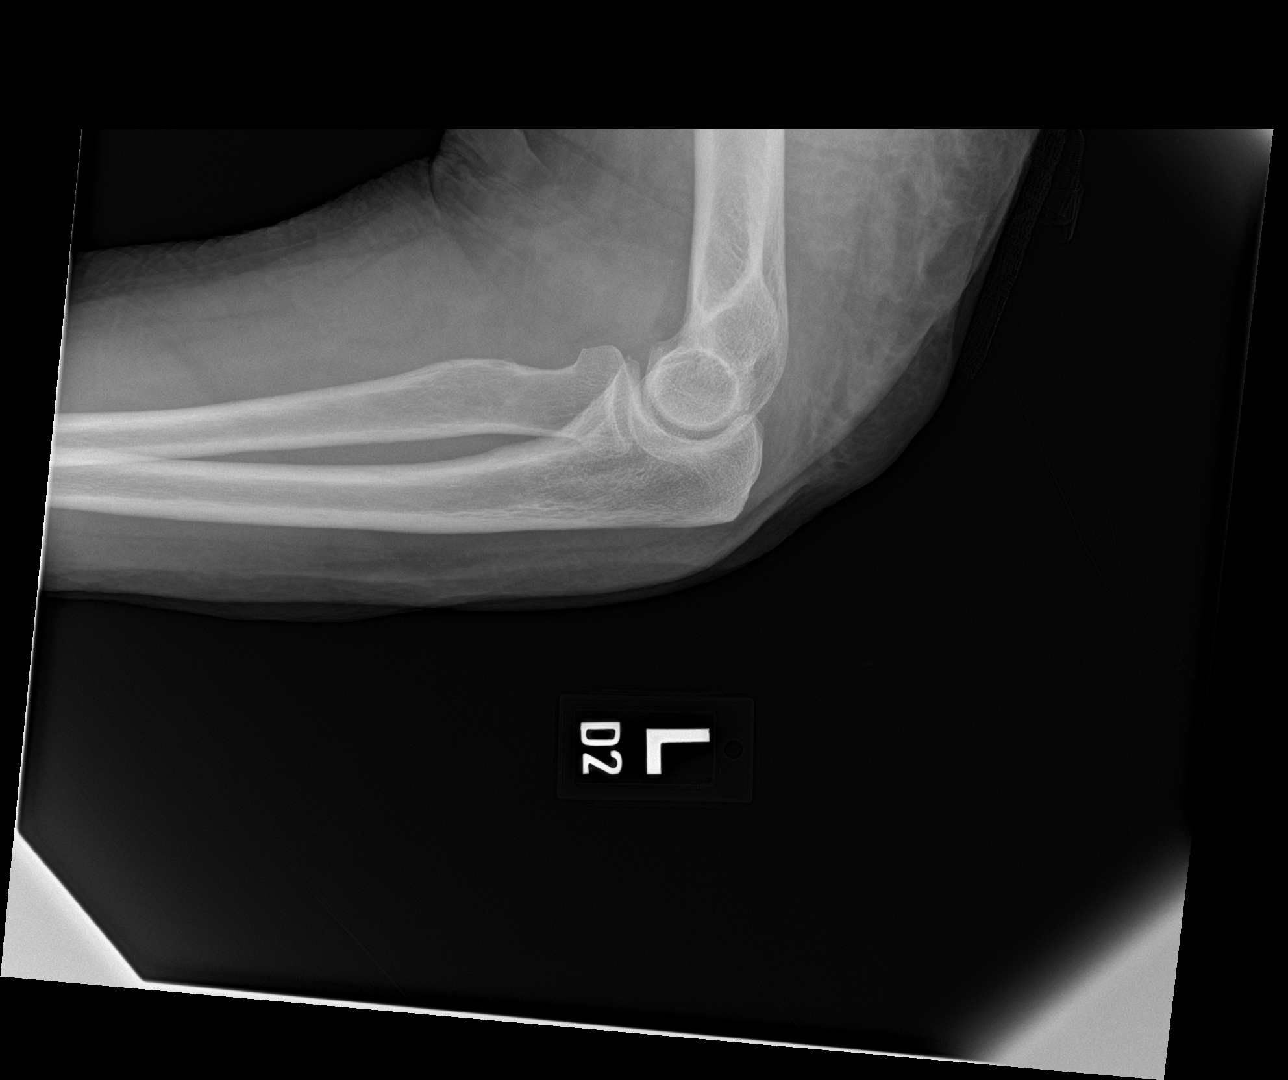

[4 of 4 positions shown; findings below may reference images not displayed]

FINDINGS: There is no evidence of fracture, dislocation, or joint effusion.
There is no evidence of arthropathy or other focal bone abnormality.
Significant soft tissue swelling is noted posterior to the distal
humerus.
IMPRESSION: No fracture or dislocation is noted. Significant soft tissue
swelling is noted posterior to the distal humerus.

## 2021-05-21 IMAGING — CR DG KNEE COMPLETE 4+V*L*
4 series · 4 of 4 positions shown · non-contrast
Comparison: None.

CLINICAL DATA: Left knee pain after fall.

EXAM:
LEFT KNEE - COMPLETE 4+ VIEW

[knee ap]
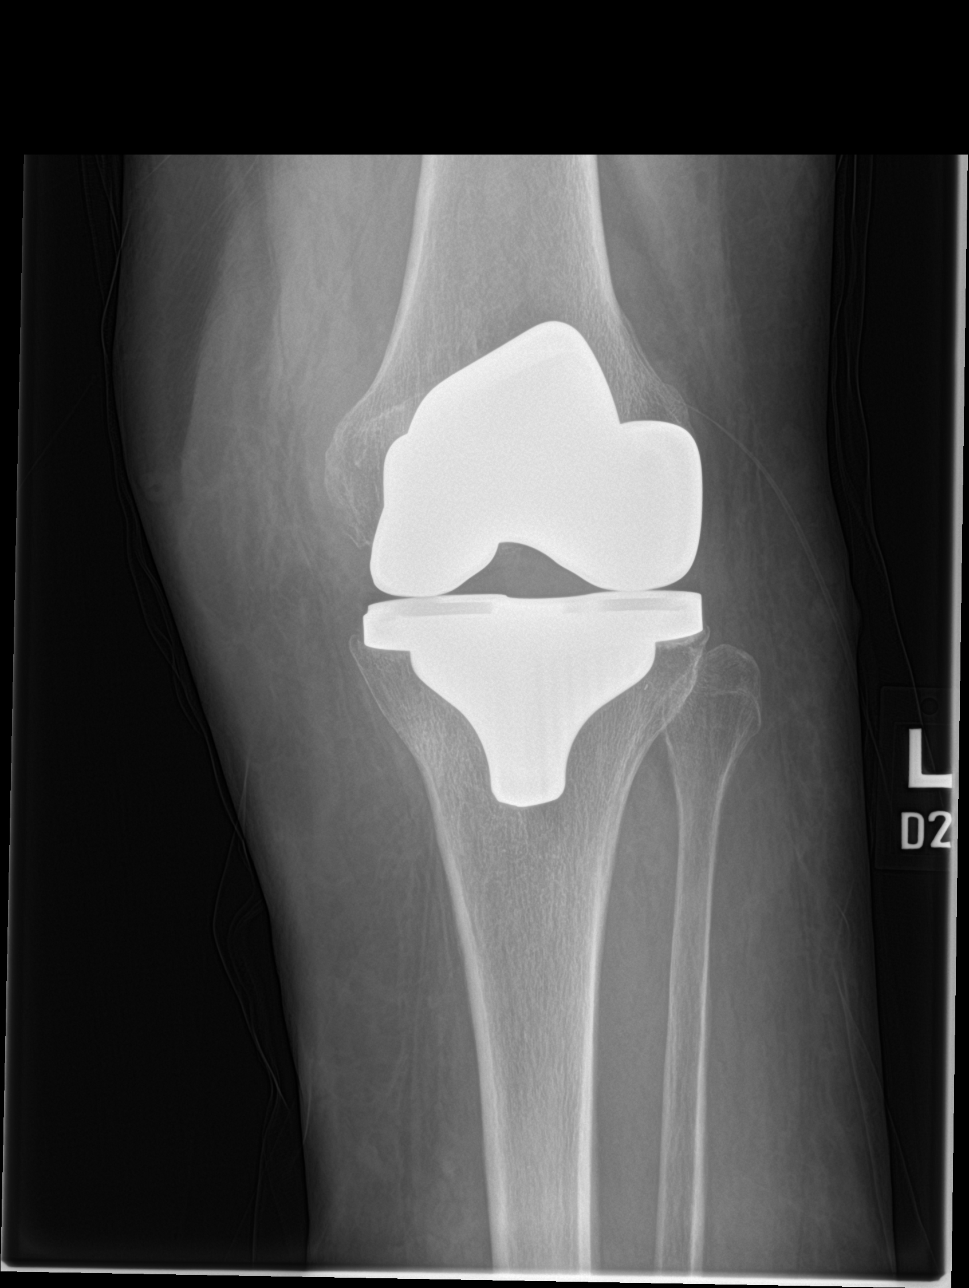

[knee lat]
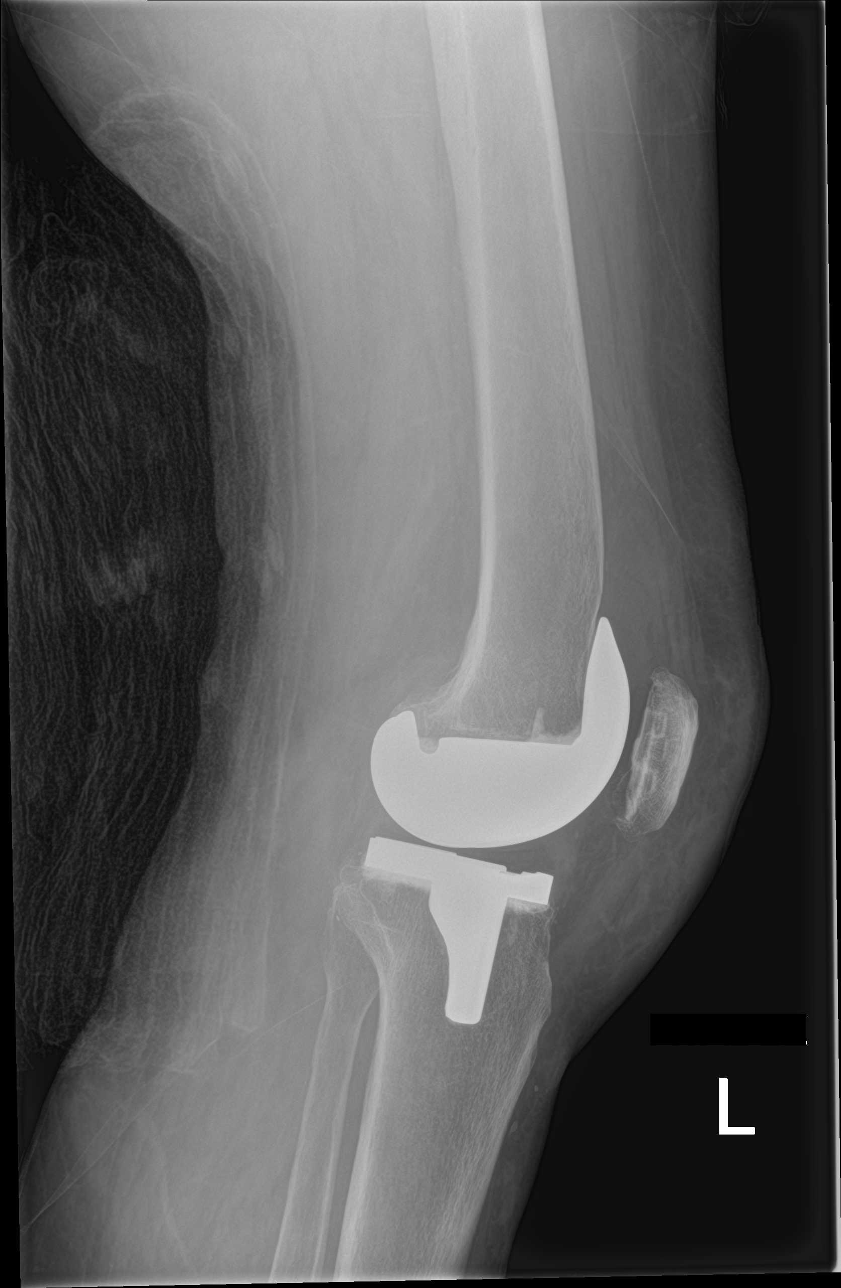

[knee obl (1 of 2)]
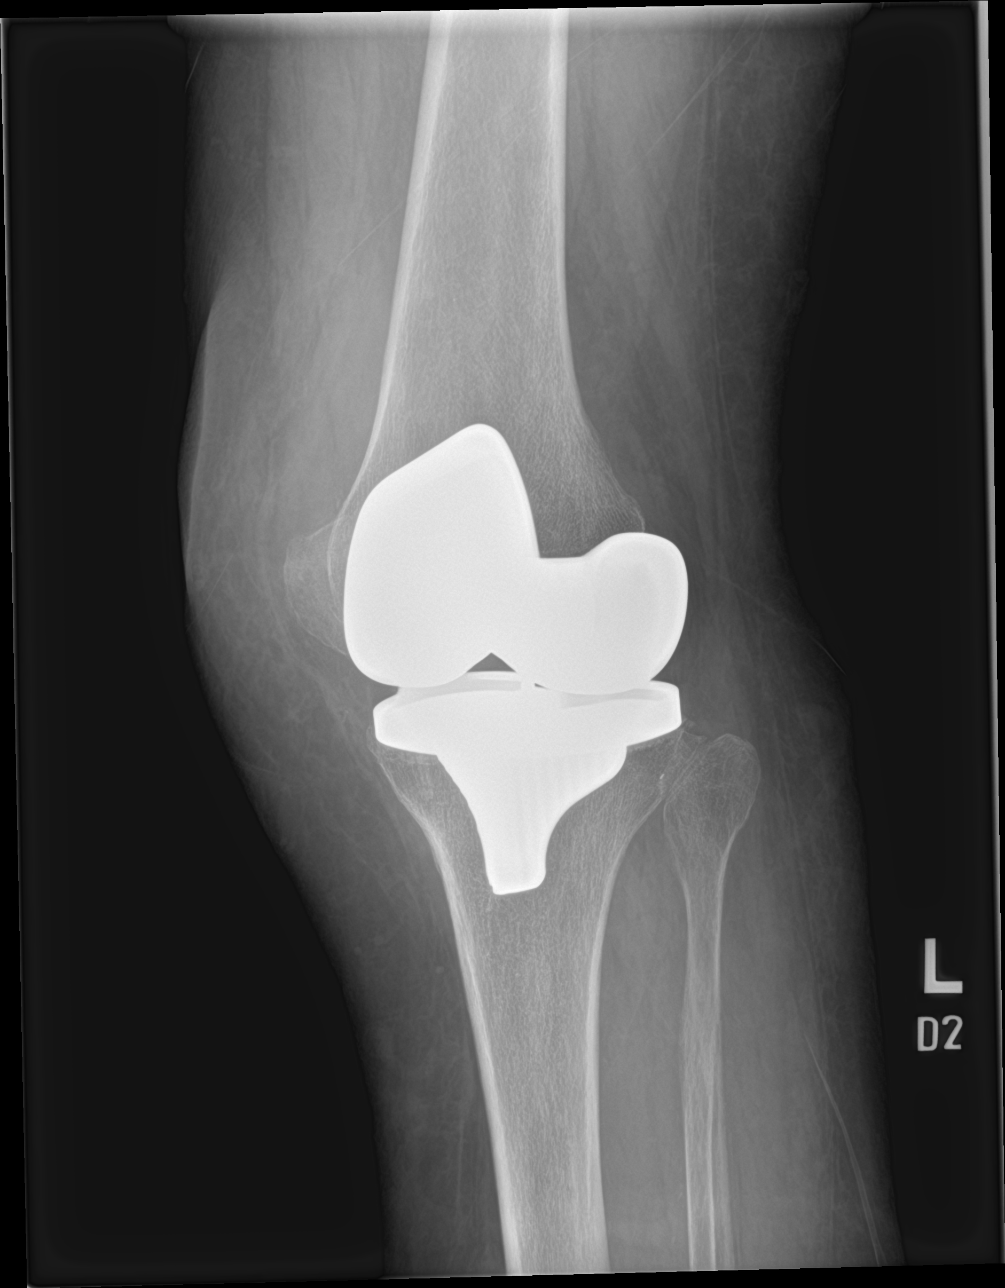

[knee obl (2 of 2)]
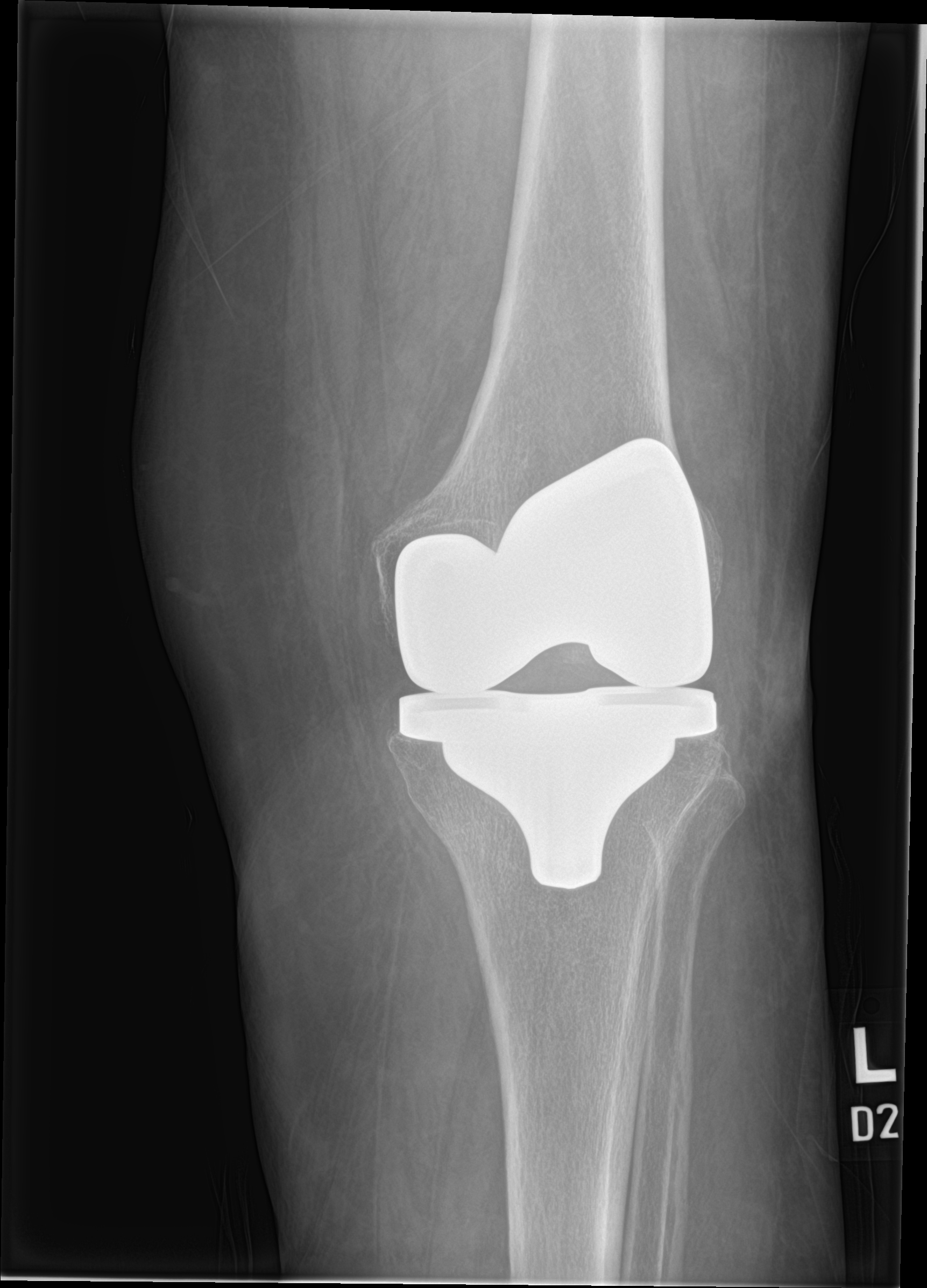

[4 of 4 positions shown; findings below may reference images not displayed]

FINDINGS: Status post left total knee arthroplasty. The femoral and tibial
components appear to be well situated. No fracture or dislocation is
noted. Soft tissues are unremarkable.
IMPRESSION: Status post left total knee arthroplasty. No acute abnormality seen.

## 2021-05-25 ENCOUNTER — Other Ambulatory Visit: Payer: Self-pay

## 2021-05-25 ENCOUNTER — Ambulatory Visit
Admission: RE | Admit: 2021-05-25 | Discharge: 2021-05-25 | Disposition: A | Discharge status: Home / Self Care | Payer: Medicare Other | Source: Ambulatory Visit | Attending: *Deleted | Admitting: *Deleted

## 2021-05-25 DIAGNOSIS — N644 Mastodynia: Secondary | ICD-10-CM

## 2021-05-25 DIAGNOSIS — R922 Inconclusive mammogram: Secondary | ICD-10-CM | POA: Diagnosis not present

## 2021-05-27 DIAGNOSIS — E11628 Type 2 diabetes mellitus with other skin complications: Secondary | ICD-10-CM | POA: Diagnosis not present

## 2021-05-27 DIAGNOSIS — I509 Heart failure, unspecified: Secondary | ICD-10-CM | POA: Diagnosis not present

## 2021-05-27 DIAGNOSIS — E1122 Type 2 diabetes mellitus with diabetic chronic kidney disease: Secondary | ICD-10-CM | POA: Diagnosis not present

## 2021-05-27 DIAGNOSIS — N1831 Chronic kidney disease, stage 3a: Secondary | ICD-10-CM | POA: Diagnosis not present

## 2021-05-27 DIAGNOSIS — I130 Hypertensive heart and chronic kidney disease with heart failure and stage 1 through stage 4 chronic kidney disease, or unspecified chronic kidney disease: Secondary | ICD-10-CM | POA: Diagnosis not present

## 2021-05-27 DIAGNOSIS — L03115 Cellulitis of right lower limb: Secondary | ICD-10-CM | POA: Diagnosis not present

## 2021-06-01 DIAGNOSIS — I129 Hypertensive chronic kidney disease with stage 1 through stage 4 chronic kidney disease, or unspecified chronic kidney disease: Secondary | ICD-10-CM | POA: Diagnosis not present

## 2021-06-01 DIAGNOSIS — N1832 Chronic kidney disease, stage 3b: Secondary | ICD-10-CM | POA: Diagnosis not present

## 2021-06-01 DIAGNOSIS — N2581 Secondary hyperparathyroidism of renal origin: Secondary | ICD-10-CM | POA: Diagnosis not present

## 2021-06-01 DIAGNOSIS — D631 Anemia in chronic kidney disease: Secondary | ICD-10-CM | POA: Diagnosis not present

## 2021-06-03 DIAGNOSIS — E11628 Type 2 diabetes mellitus with other skin complications: Secondary | ICD-10-CM | POA: Diagnosis not present

## 2021-06-03 DIAGNOSIS — E1122 Type 2 diabetes mellitus with diabetic chronic kidney disease: Secondary | ICD-10-CM | POA: Diagnosis not present

## 2021-06-03 DIAGNOSIS — N1831 Chronic kidney disease, stage 3a: Secondary | ICD-10-CM | POA: Diagnosis not present

## 2021-06-03 DIAGNOSIS — L03115 Cellulitis of right lower limb: Secondary | ICD-10-CM | POA: Diagnosis not present

## 2021-06-03 DIAGNOSIS — I509 Heart failure, unspecified: Secondary | ICD-10-CM | POA: Diagnosis not present

## 2021-06-03 DIAGNOSIS — I130 Hypertensive heart and chronic kidney disease with heart failure and stage 1 through stage 4 chronic kidney disease, or unspecified chronic kidney disease: Secondary | ICD-10-CM | POA: Diagnosis not present

## 2021-06-04 DIAGNOSIS — I130 Hypertensive heart and chronic kidney disease with heart failure and stage 1 through stage 4 chronic kidney disease, or unspecified chronic kidney disease: Secondary | ICD-10-CM | POA: Diagnosis not present

## 2021-06-04 DIAGNOSIS — E11628 Type 2 diabetes mellitus with other skin complications: Secondary | ICD-10-CM | POA: Diagnosis not present

## 2021-06-04 DIAGNOSIS — E1122 Type 2 diabetes mellitus with diabetic chronic kidney disease: Secondary | ICD-10-CM | POA: Diagnosis not present

## 2021-06-04 DIAGNOSIS — L03115 Cellulitis of right lower limb: Secondary | ICD-10-CM | POA: Diagnosis not present

## 2021-06-04 DIAGNOSIS — I509 Heart failure, unspecified: Secondary | ICD-10-CM | POA: Diagnosis not present

## 2021-06-04 DIAGNOSIS — N1831 Chronic kidney disease, stage 3a: Secondary | ICD-10-CM | POA: Diagnosis not present

## 2021-06-10 DIAGNOSIS — L03115 Cellulitis of right lower limb: Secondary | ICD-10-CM | POA: Diagnosis not present

## 2021-06-10 DIAGNOSIS — I130 Hypertensive heart and chronic kidney disease with heart failure and stage 1 through stage 4 chronic kidney disease, or unspecified chronic kidney disease: Secondary | ICD-10-CM | POA: Diagnosis not present

## 2021-06-10 DIAGNOSIS — N1831 Chronic kidney disease, stage 3a: Secondary | ICD-10-CM | POA: Diagnosis not present

## 2021-06-10 DIAGNOSIS — E1122 Type 2 diabetes mellitus with diabetic chronic kidney disease: Secondary | ICD-10-CM | POA: Diagnosis not present

## 2021-06-10 DIAGNOSIS — I509 Heart failure, unspecified: Secondary | ICD-10-CM | POA: Diagnosis not present

## 2021-06-10 DIAGNOSIS — E11628 Type 2 diabetes mellitus with other skin complications: Secondary | ICD-10-CM | POA: Diagnosis not present

## 2021-06-17 DIAGNOSIS — I509 Heart failure, unspecified: Secondary | ICD-10-CM | POA: Diagnosis not present

## 2021-06-17 DIAGNOSIS — E1122 Type 2 diabetes mellitus with diabetic chronic kidney disease: Secondary | ICD-10-CM | POA: Diagnosis not present

## 2021-06-17 DIAGNOSIS — I130 Hypertensive heart and chronic kidney disease with heart failure and stage 1 through stage 4 chronic kidney disease, or unspecified chronic kidney disease: Secondary | ICD-10-CM | POA: Diagnosis not present

## 2021-06-17 DIAGNOSIS — N1831 Chronic kidney disease, stage 3a: Secondary | ICD-10-CM | POA: Diagnosis not present

## 2021-06-17 DIAGNOSIS — E11628 Type 2 diabetes mellitus with other skin complications: Secondary | ICD-10-CM | POA: Diagnosis not present

## 2021-06-17 DIAGNOSIS — L03115 Cellulitis of right lower limb: Secondary | ICD-10-CM | POA: Diagnosis not present

## 2021-06-18 DIAGNOSIS — Z853 Personal history of malignant neoplasm of breast: Secondary | ICD-10-CM | POA: Diagnosis not present

## 2021-06-18 DIAGNOSIS — F0394 Unspecified dementia, unspecified severity, with anxiety: Secondary | ICD-10-CM | POA: Diagnosis not present

## 2021-06-18 DIAGNOSIS — I509 Heart failure, unspecified: Secondary | ICD-10-CM | POA: Diagnosis not present

## 2021-06-18 DIAGNOSIS — I2510 Atherosclerotic heart disease of native coronary artery without angina pectoris: Secondary | ICD-10-CM | POA: Diagnosis not present

## 2021-06-18 DIAGNOSIS — Z794 Long term (current) use of insulin: Secondary | ICD-10-CM | POA: Diagnosis not present

## 2021-06-18 DIAGNOSIS — M14671 Charcot's joint, right ankle and foot: Secondary | ICD-10-CM | POA: Diagnosis not present

## 2021-06-18 DIAGNOSIS — G8929 Other chronic pain: Secondary | ICD-10-CM | POA: Diagnosis not present

## 2021-06-18 DIAGNOSIS — I130 Hypertensive heart and chronic kidney disease with heart failure and stage 1 through stage 4 chronic kidney disease, or unspecified chronic kidney disease: Secondary | ICD-10-CM | POA: Diagnosis not present

## 2021-06-18 DIAGNOSIS — E1140 Type 2 diabetes mellitus with diabetic neuropathy, unspecified: Secondary | ICD-10-CM | POA: Diagnosis not present

## 2021-06-18 DIAGNOSIS — N1831 Chronic kidney disease, stage 3a: Secondary | ICD-10-CM | POA: Diagnosis not present

## 2021-06-18 DIAGNOSIS — Z7901 Long term (current) use of anticoagulants: Secondary | ICD-10-CM | POA: Diagnosis not present

## 2021-06-18 DIAGNOSIS — I82432 Acute embolism and thrombosis of left popliteal vein: Secondary | ICD-10-CM | POA: Diagnosis not present

## 2021-06-18 DIAGNOSIS — M25512 Pain in left shoulder: Secondary | ICD-10-CM | POA: Diagnosis not present

## 2021-06-18 DIAGNOSIS — Z7984 Long term (current) use of oral hypoglycemic drugs: Secondary | ICD-10-CM | POA: Diagnosis not present

## 2021-06-18 DIAGNOSIS — E11620 Type 2 diabetes mellitus with diabetic dermatitis: Secondary | ICD-10-CM | POA: Diagnosis not present

## 2021-06-18 DIAGNOSIS — G2581 Restless legs syndrome: Secondary | ICD-10-CM | POA: Diagnosis not present

## 2021-06-18 DIAGNOSIS — E1122 Type 2 diabetes mellitus with diabetic chronic kidney disease: Secondary | ICD-10-CM | POA: Diagnosis not present

## 2021-06-18 DIAGNOSIS — Z7902 Long term (current) use of antithrombotics/antiplatelets: Secondary | ICD-10-CM | POA: Diagnosis not present

## 2021-06-24 DIAGNOSIS — I82432 Acute embolism and thrombosis of left popliteal vein: Secondary | ICD-10-CM | POA: Diagnosis not present

## 2021-06-24 DIAGNOSIS — I130 Hypertensive heart and chronic kidney disease with heart failure and stage 1 through stage 4 chronic kidney disease, or unspecified chronic kidney disease: Secondary | ICD-10-CM | POA: Diagnosis not present

## 2021-06-24 DIAGNOSIS — E1122 Type 2 diabetes mellitus with diabetic chronic kidney disease: Secondary | ICD-10-CM | POA: Diagnosis not present

## 2021-06-24 DIAGNOSIS — M14671 Charcot's joint, right ankle and foot: Secondary | ICD-10-CM | POA: Diagnosis not present

## 2021-06-24 DIAGNOSIS — N1831 Chronic kidney disease, stage 3a: Secondary | ICD-10-CM | POA: Diagnosis not present

## 2021-06-24 DIAGNOSIS — I509 Heart failure, unspecified: Secondary | ICD-10-CM | POA: Diagnosis not present

## 2021-06-26 DIAGNOSIS — I509 Heart failure, unspecified: Secondary | ICD-10-CM | POA: Diagnosis not present

## 2021-06-26 DIAGNOSIS — I130 Hypertensive heart and chronic kidney disease with heart failure and stage 1 through stage 4 chronic kidney disease, or unspecified chronic kidney disease: Secondary | ICD-10-CM | POA: Diagnosis not present

## 2021-06-26 DIAGNOSIS — N1831 Chronic kidney disease, stage 3a: Secondary | ICD-10-CM | POA: Diagnosis not present

## 2021-06-26 DIAGNOSIS — I82432 Acute embolism and thrombosis of left popliteal vein: Secondary | ICD-10-CM | POA: Diagnosis not present

## 2021-06-26 DIAGNOSIS — M14671 Charcot's joint, right ankle and foot: Secondary | ICD-10-CM | POA: Diagnosis not present

## 2021-06-26 DIAGNOSIS — E1122 Type 2 diabetes mellitus with diabetic chronic kidney disease: Secondary | ICD-10-CM | POA: Diagnosis not present

## 2021-07-01 DIAGNOSIS — N1831 Chronic kidney disease, stage 3a: Secondary | ICD-10-CM | POA: Diagnosis not present

## 2021-07-01 DIAGNOSIS — I130 Hypertensive heart and chronic kidney disease with heart failure and stage 1 through stage 4 chronic kidney disease, or unspecified chronic kidney disease: Secondary | ICD-10-CM | POA: Diagnosis not present

## 2021-07-01 DIAGNOSIS — E1122 Type 2 diabetes mellitus with diabetic chronic kidney disease: Secondary | ICD-10-CM | POA: Diagnosis not present

## 2021-07-01 DIAGNOSIS — M14671 Charcot's joint, right ankle and foot: Secondary | ICD-10-CM | POA: Diagnosis not present

## 2021-07-01 DIAGNOSIS — I509 Heart failure, unspecified: Secondary | ICD-10-CM | POA: Diagnosis not present

## 2021-07-01 DIAGNOSIS — I82432 Acute embolism and thrombosis of left popliteal vein: Secondary | ICD-10-CM | POA: Diagnosis not present

## 2021-07-04 DIAGNOSIS — M14671 Charcot's joint, right ankle and foot: Secondary | ICD-10-CM | POA: Diagnosis not present

## 2021-07-04 DIAGNOSIS — E1122 Type 2 diabetes mellitus with diabetic chronic kidney disease: Secondary | ICD-10-CM | POA: Diagnosis not present

## 2021-07-04 DIAGNOSIS — N1831 Chronic kidney disease, stage 3a: Secondary | ICD-10-CM | POA: Diagnosis not present

## 2021-07-04 DIAGNOSIS — I82432 Acute embolism and thrombosis of left popliteal vein: Secondary | ICD-10-CM | POA: Diagnosis not present

## 2021-07-04 DIAGNOSIS — I130 Hypertensive heart and chronic kidney disease with heart failure and stage 1 through stage 4 chronic kidney disease, or unspecified chronic kidney disease: Secondary | ICD-10-CM | POA: Diagnosis not present

## 2021-07-04 DIAGNOSIS — I509 Heart failure, unspecified: Secondary | ICD-10-CM | POA: Diagnosis not present

## 2021-07-08 DIAGNOSIS — M14671 Charcot's joint, right ankle and foot: Secondary | ICD-10-CM | POA: Diagnosis not present

## 2021-07-08 DIAGNOSIS — I82432 Acute embolism and thrombosis of left popliteal vein: Secondary | ICD-10-CM | POA: Diagnosis not present

## 2021-07-08 DIAGNOSIS — E1122 Type 2 diabetes mellitus with diabetic chronic kidney disease: Secondary | ICD-10-CM | POA: Diagnosis not present

## 2021-07-08 DIAGNOSIS — I509 Heart failure, unspecified: Secondary | ICD-10-CM | POA: Diagnosis not present

## 2021-07-08 DIAGNOSIS — I130 Hypertensive heart and chronic kidney disease with heart failure and stage 1 through stage 4 chronic kidney disease, or unspecified chronic kidney disease: Secondary | ICD-10-CM | POA: Diagnosis not present

## 2021-07-08 DIAGNOSIS — N1831 Chronic kidney disease, stage 3a: Secondary | ICD-10-CM | POA: Diagnosis not present

## 2021-07-10 DIAGNOSIS — E1122 Type 2 diabetes mellitus with diabetic chronic kidney disease: Secondary | ICD-10-CM | POA: Diagnosis not present

## 2021-07-10 DIAGNOSIS — M14671 Charcot's joint, right ankle and foot: Secondary | ICD-10-CM | POA: Diagnosis not present

## 2021-07-10 DIAGNOSIS — I130 Hypertensive heart and chronic kidney disease with heart failure and stage 1 through stage 4 chronic kidney disease, or unspecified chronic kidney disease: Secondary | ICD-10-CM | POA: Diagnosis not present

## 2021-07-10 DIAGNOSIS — N1831 Chronic kidney disease, stage 3a: Secondary | ICD-10-CM | POA: Diagnosis not present

## 2021-07-10 DIAGNOSIS — I82432 Acute embolism and thrombosis of left popliteal vein: Secondary | ICD-10-CM | POA: Diagnosis not present

## 2021-07-10 DIAGNOSIS — I509 Heart failure, unspecified: Secondary | ICD-10-CM | POA: Diagnosis not present

## 2021-07-13 ENCOUNTER — Ambulatory Visit: Payer: Medicare Other | Admitting: Cardiology

## 2021-07-14 DIAGNOSIS — I82432 Acute embolism and thrombosis of left popliteal vein: Secondary | ICD-10-CM | POA: Diagnosis not present

## 2021-07-14 DIAGNOSIS — N1831 Chronic kidney disease, stage 3a: Secondary | ICD-10-CM | POA: Diagnosis not present

## 2021-07-14 DIAGNOSIS — I130 Hypertensive heart and chronic kidney disease with heart failure and stage 1 through stage 4 chronic kidney disease, or unspecified chronic kidney disease: Secondary | ICD-10-CM | POA: Diagnosis not present

## 2021-07-14 DIAGNOSIS — E1122 Type 2 diabetes mellitus with diabetic chronic kidney disease: Secondary | ICD-10-CM | POA: Diagnosis not present

## 2021-07-14 DIAGNOSIS — I509 Heart failure, unspecified: Secondary | ICD-10-CM | POA: Diagnosis not present

## 2021-07-14 DIAGNOSIS — M14671 Charcot's joint, right ankle and foot: Secondary | ICD-10-CM | POA: Diagnosis not present

## 2021-07-15 DIAGNOSIS — N1831 Chronic kidney disease, stage 3a: Secondary | ICD-10-CM | POA: Diagnosis not present

## 2021-07-15 DIAGNOSIS — B351 Tinea unguium: Secondary | ICD-10-CM | POA: Diagnosis not present

## 2021-07-15 DIAGNOSIS — M14671 Charcot's joint, right ankle and foot: Secondary | ICD-10-CM | POA: Diagnosis not present

## 2021-07-15 DIAGNOSIS — R262 Difficulty in walking, not elsewhere classified: Secondary | ICD-10-CM | POA: Diagnosis not present

## 2021-07-15 DIAGNOSIS — I130 Hypertensive heart and chronic kidney disease with heart failure and stage 1 through stage 4 chronic kidney disease, or unspecified chronic kidney disease: Secondary | ICD-10-CM | POA: Diagnosis not present

## 2021-07-15 DIAGNOSIS — L84 Corns and callosities: Secondary | ICD-10-CM | POA: Diagnosis not present

## 2021-07-15 DIAGNOSIS — E1122 Type 2 diabetes mellitus with diabetic chronic kidney disease: Secondary | ICD-10-CM | POA: Diagnosis not present

## 2021-07-15 DIAGNOSIS — I82432 Acute embolism and thrombosis of left popliteal vein: Secondary | ICD-10-CM | POA: Diagnosis not present

## 2021-07-15 DIAGNOSIS — Z7409 Other reduced mobility: Secondary | ICD-10-CM | POA: Diagnosis not present

## 2021-07-15 DIAGNOSIS — I509 Heart failure, unspecified: Secondary | ICD-10-CM | POA: Diagnosis not present

## 2021-07-15 DIAGNOSIS — Z794 Long term (current) use of insulin: Secondary | ICD-10-CM | POA: Diagnosis not present

## 2021-07-15 DIAGNOSIS — E11610 Type 2 diabetes mellitus with diabetic neuropathic arthropathy: Secondary | ICD-10-CM | POA: Diagnosis not present

## 2021-07-18 DIAGNOSIS — E1122 Type 2 diabetes mellitus with diabetic chronic kidney disease: Secondary | ICD-10-CM | POA: Diagnosis not present

## 2021-07-18 DIAGNOSIS — M14671 Charcot's joint, right ankle and foot: Secondary | ICD-10-CM | POA: Diagnosis not present

## 2021-07-18 DIAGNOSIS — E11620 Type 2 diabetes mellitus with diabetic dermatitis: Secondary | ICD-10-CM | POA: Diagnosis not present

## 2021-07-18 DIAGNOSIS — Z7984 Long term (current) use of oral hypoglycemic drugs: Secondary | ICD-10-CM | POA: Diagnosis not present

## 2021-07-18 DIAGNOSIS — I509 Heart failure, unspecified: Secondary | ICD-10-CM | POA: Diagnosis not present

## 2021-07-18 DIAGNOSIS — E1140 Type 2 diabetes mellitus with diabetic neuropathy, unspecified: Secondary | ICD-10-CM | POA: Diagnosis not present

## 2021-07-18 DIAGNOSIS — I130 Hypertensive heart and chronic kidney disease with heart failure and stage 1 through stage 4 chronic kidney disease, or unspecified chronic kidney disease: Secondary | ICD-10-CM | POA: Diagnosis not present

## 2021-07-18 DIAGNOSIS — Z7901 Long term (current) use of anticoagulants: Secondary | ICD-10-CM | POA: Diagnosis not present

## 2021-07-18 DIAGNOSIS — I2510 Atherosclerotic heart disease of native coronary artery without angina pectoris: Secondary | ICD-10-CM | POA: Diagnosis not present

## 2021-07-18 DIAGNOSIS — M25512 Pain in left shoulder: Secondary | ICD-10-CM | POA: Diagnosis not present

## 2021-07-18 DIAGNOSIS — F0394 Unspecified dementia, unspecified severity, with anxiety: Secondary | ICD-10-CM | POA: Diagnosis not present

## 2021-07-18 DIAGNOSIS — Z7902 Long term (current) use of antithrombotics/antiplatelets: Secondary | ICD-10-CM | POA: Diagnosis not present

## 2021-07-18 DIAGNOSIS — G2581 Restless legs syndrome: Secondary | ICD-10-CM | POA: Diagnosis not present

## 2021-07-18 DIAGNOSIS — G8929 Other chronic pain: Secondary | ICD-10-CM | POA: Diagnosis not present

## 2021-07-18 DIAGNOSIS — N1831 Chronic kidney disease, stage 3a: Secondary | ICD-10-CM | POA: Diagnosis not present

## 2021-07-18 DIAGNOSIS — Z794 Long term (current) use of insulin: Secondary | ICD-10-CM | POA: Diagnosis not present

## 2021-07-18 DIAGNOSIS — Z853 Personal history of malignant neoplasm of breast: Secondary | ICD-10-CM | POA: Diagnosis not present

## 2021-07-18 DIAGNOSIS — I82432 Acute embolism and thrombosis of left popliteal vein: Secondary | ICD-10-CM | POA: Diagnosis not present

## 2021-07-21 DIAGNOSIS — N1831 Chronic kidney disease, stage 3a: Secondary | ICD-10-CM | POA: Diagnosis not present

## 2021-07-21 DIAGNOSIS — I82432 Acute embolism and thrombosis of left popliteal vein: Secondary | ICD-10-CM | POA: Diagnosis not present

## 2021-07-21 DIAGNOSIS — E1122 Type 2 diabetes mellitus with diabetic chronic kidney disease: Secondary | ICD-10-CM | POA: Diagnosis not present

## 2021-07-21 DIAGNOSIS — I509 Heart failure, unspecified: Secondary | ICD-10-CM | POA: Diagnosis not present

## 2021-07-21 DIAGNOSIS — I130 Hypertensive heart and chronic kidney disease with heart failure and stage 1 through stage 4 chronic kidney disease, or unspecified chronic kidney disease: Secondary | ICD-10-CM | POA: Diagnosis not present

## 2021-07-21 DIAGNOSIS — M14671 Charcot's joint, right ankle and foot: Secondary | ICD-10-CM | POA: Diagnosis not present

## 2021-07-22 DIAGNOSIS — I130 Hypertensive heart and chronic kidney disease with heart failure and stage 1 through stage 4 chronic kidney disease, or unspecified chronic kidney disease: Secondary | ICD-10-CM | POA: Diagnosis not present

## 2021-07-22 DIAGNOSIS — E1122 Type 2 diabetes mellitus with diabetic chronic kidney disease: Secondary | ICD-10-CM | POA: Diagnosis not present

## 2021-07-22 DIAGNOSIS — M14671 Charcot's joint, right ankle and foot: Secondary | ICD-10-CM | POA: Diagnosis not present

## 2021-07-22 DIAGNOSIS — N1831 Chronic kidney disease, stage 3a: Secondary | ICD-10-CM | POA: Diagnosis not present

## 2021-07-22 DIAGNOSIS — I82432 Acute embolism and thrombosis of left popliteal vein: Secondary | ICD-10-CM | POA: Diagnosis not present

## 2021-07-22 DIAGNOSIS — I509 Heart failure, unspecified: Secondary | ICD-10-CM | POA: Diagnosis not present

## 2021-07-29 DIAGNOSIS — L820 Inflamed seborrheic keratosis: Secondary | ICD-10-CM | POA: Diagnosis not present

## 2021-07-29 DIAGNOSIS — N1831 Chronic kidney disease, stage 3a: Secondary | ICD-10-CM | POA: Diagnosis not present

## 2021-07-29 DIAGNOSIS — Z08 Encounter for follow-up examination after completed treatment for malignant neoplasm: Secondary | ICD-10-CM | POA: Diagnosis not present

## 2021-07-29 DIAGNOSIS — L570 Actinic keratosis: Secondary | ICD-10-CM | POA: Diagnosis not present

## 2021-07-29 DIAGNOSIS — I82432 Acute embolism and thrombosis of left popliteal vein: Secondary | ICD-10-CM | POA: Diagnosis not present

## 2021-07-29 DIAGNOSIS — X32XXXD Exposure to sunlight, subsequent encounter: Secondary | ICD-10-CM | POA: Diagnosis not present

## 2021-07-29 DIAGNOSIS — M14671 Charcot's joint, right ankle and foot: Secondary | ICD-10-CM | POA: Diagnosis not present

## 2021-07-29 DIAGNOSIS — Z85828 Personal history of other malignant neoplasm of skin: Secondary | ICD-10-CM | POA: Diagnosis not present

## 2021-07-29 DIAGNOSIS — I509 Heart failure, unspecified: Secondary | ICD-10-CM | POA: Diagnosis not present

## 2021-07-29 DIAGNOSIS — I130 Hypertensive heart and chronic kidney disease with heart failure and stage 1 through stage 4 chronic kidney disease, or unspecified chronic kidney disease: Secondary | ICD-10-CM | POA: Diagnosis not present

## 2021-07-29 DIAGNOSIS — E1122 Type 2 diabetes mellitus with diabetic chronic kidney disease: Secondary | ICD-10-CM | POA: Diagnosis not present

## 2021-07-30 DIAGNOSIS — I509 Heart failure, unspecified: Secondary | ICD-10-CM | POA: Diagnosis not present

## 2021-07-30 DIAGNOSIS — I130 Hypertensive heart and chronic kidney disease with heart failure and stage 1 through stage 4 chronic kidney disease, or unspecified chronic kidney disease: Secondary | ICD-10-CM | POA: Diagnosis not present

## 2021-07-30 DIAGNOSIS — I82432 Acute embolism and thrombosis of left popliteal vein: Secondary | ICD-10-CM | POA: Diagnosis not present

## 2021-07-30 DIAGNOSIS — M14671 Charcot's joint, right ankle and foot: Secondary | ICD-10-CM | POA: Diagnosis not present

## 2021-07-30 DIAGNOSIS — E1122 Type 2 diabetes mellitus with diabetic chronic kidney disease: Secondary | ICD-10-CM | POA: Diagnosis not present

## 2021-07-30 DIAGNOSIS — N1831 Chronic kidney disease, stage 3a: Secondary | ICD-10-CM | POA: Diagnosis not present

## 2021-08-03 IMAGING — MR MR MRA HEAD W/O CM
1 series · 18 of 48 positions shown · non-contrast
Comparison: Noncontrast head CT 02/08/2020. Brain MRI 11/06/2019.
MRA head 09/26/2009.

CLINICAL DATA: Neuro deficit, acute, stroke suspected. Additional
history provided: Fall at home.

EXAM:
MRI HEAD WITHOUT CONTRAST
MRA HEAD WITHOUT CONTRAST
TECHNIQUE: Multiplanar, multiecho pulse sequences of the brain and surrounding
structures were obtained without intravenous contrast. Angiographic
images of the head were obtained using MRA technique without
contrast.

[Series 5: 3d cow · axial · 0.5mm · 0.41mm/px · z∈[-68,+13]mm · 18 of 172 slices shown]
[im 1/172]
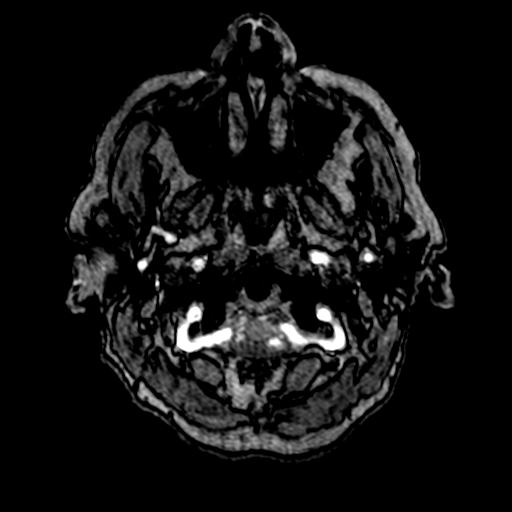
[im 4/172]
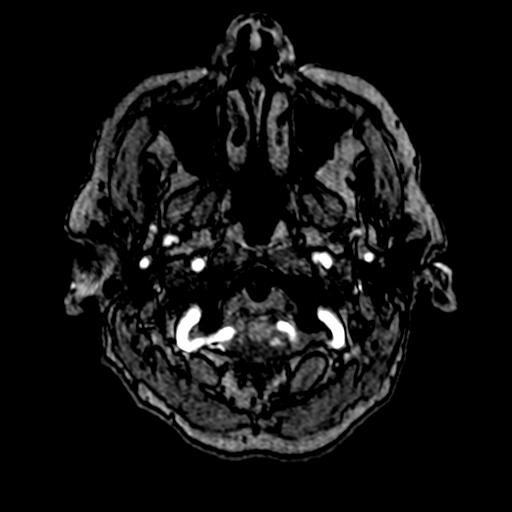
[im 8/172]
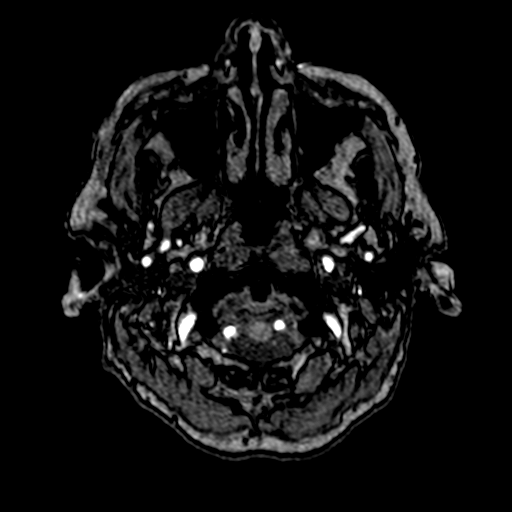
[im 11/172]
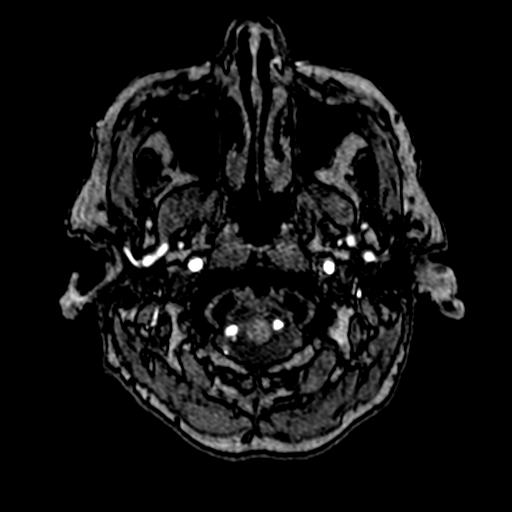
[im 15/172]
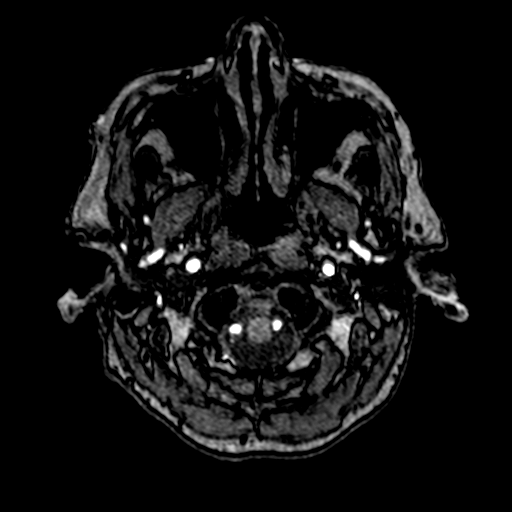
[im 19/172]
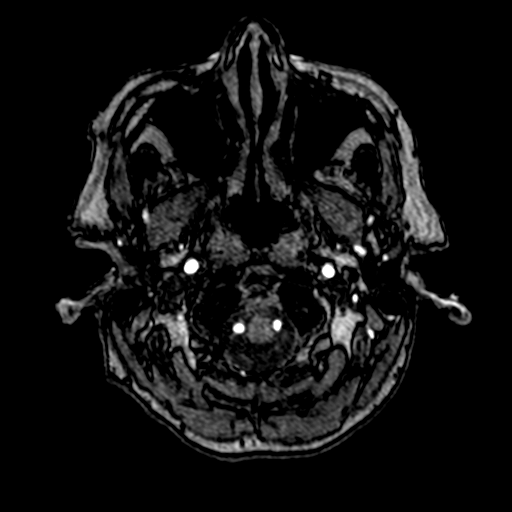
[im 22/172]
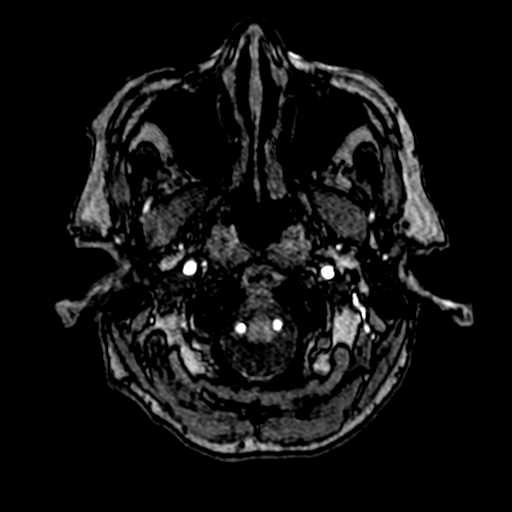
[im 26/172]
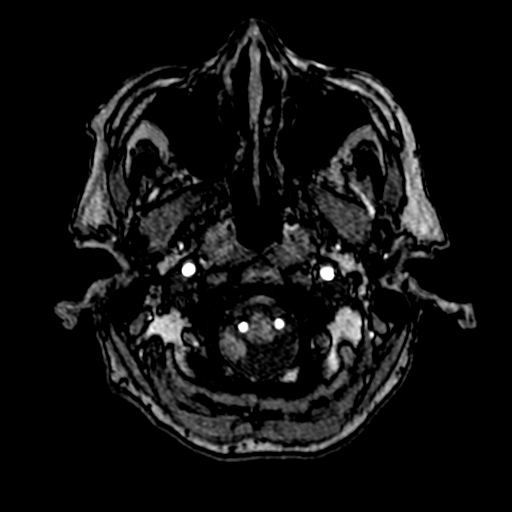
[im 30/172]
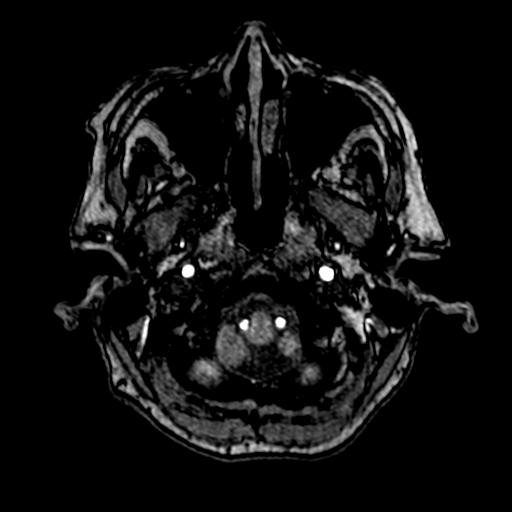
[im 33/172]
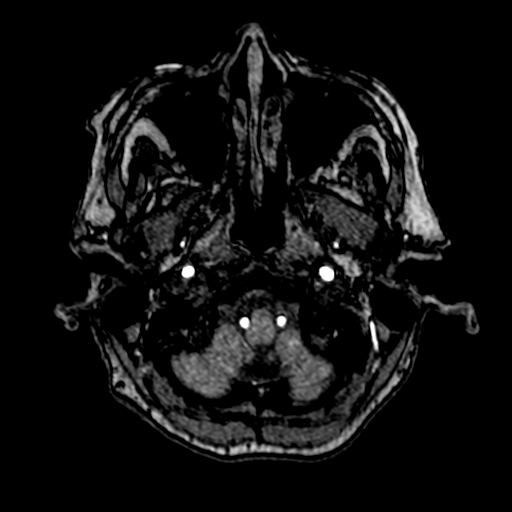
[im 55/172]
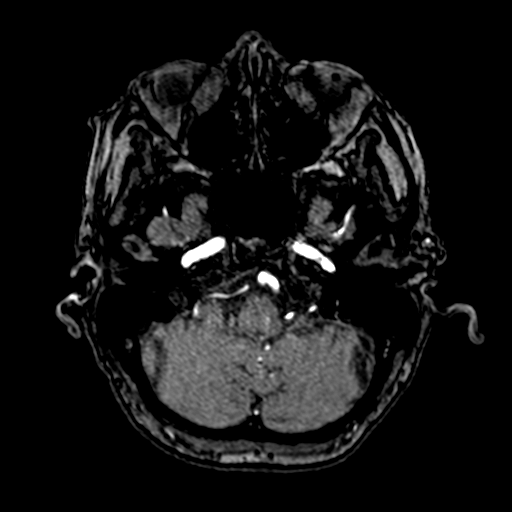
[im 77/172]
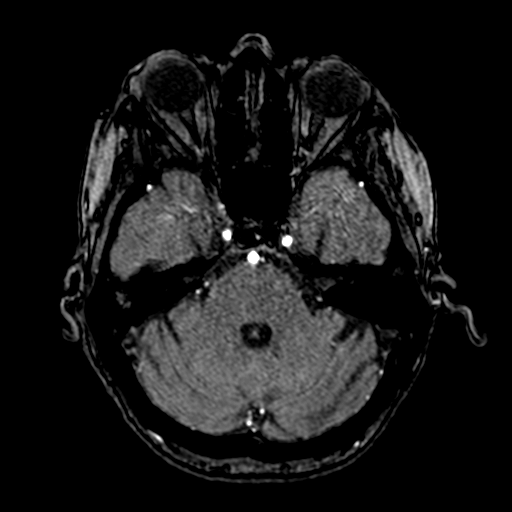
[im 88/172]
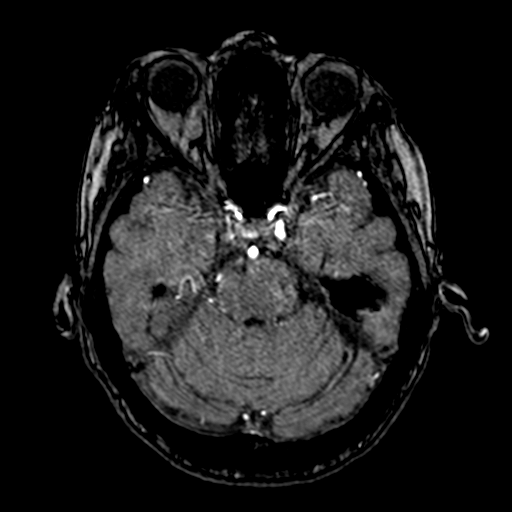
[im 99/172]
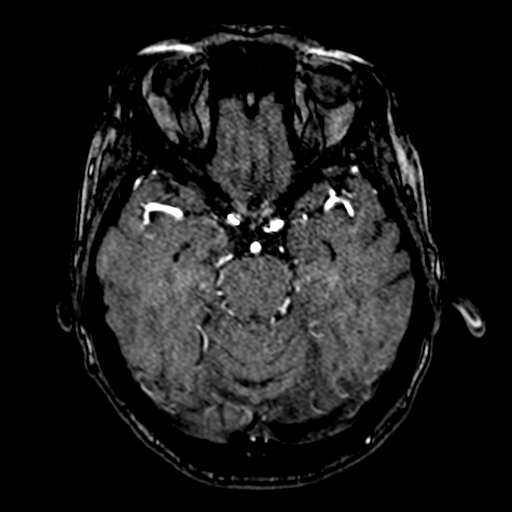
[im 121/172]
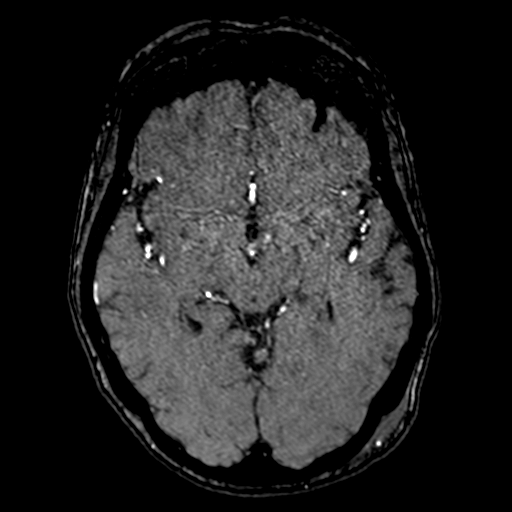
[im 142/172]
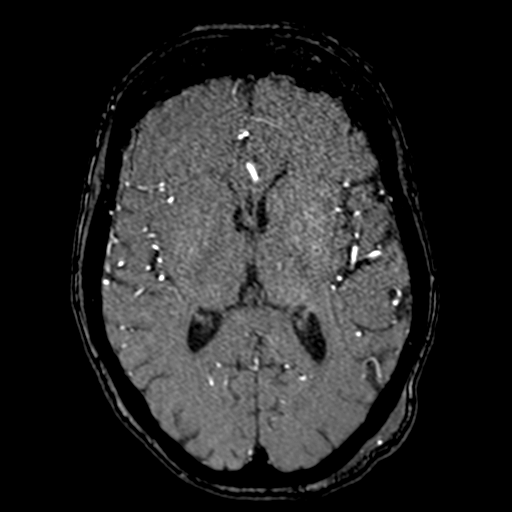
[im 146/172]
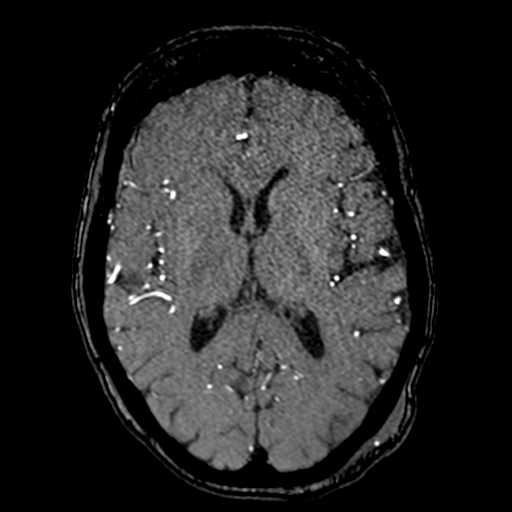
[im 164/172]
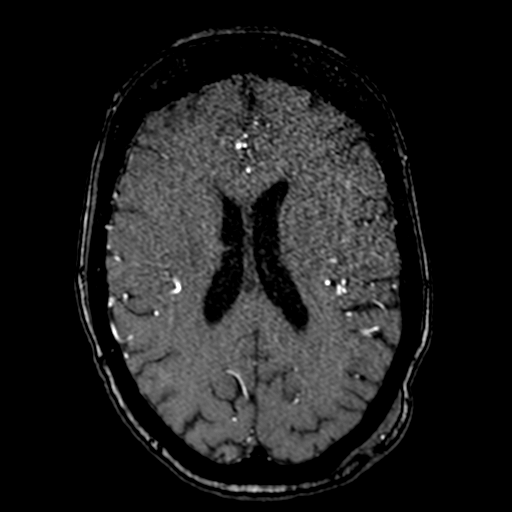

[18 of 48 positions shown; findings below may reference images not displayed]

FINDINGS: MRI HEAD FINDINGS

Brain:

Cerebral volume is normal for age.

There is a thin acute subdural hematoma overlying the anterolateral
right frontal lobe measuring up to 4 mm in greatest thickness (for
instance as seen on series 11, image 13). In retrospect, this is
appreciable on the head CT performed earlier the same day. No
significant mass effect upon the underlying right cerebral
hemisphere. No midline shift.

Moderate multifocal T2/FLAIR hyperintensity within the cerebral
white matter and pons is nonspecific, but compatible with chronic
small vessel ischemic disease.

Unchanged 5 mm rounded extra-axial calcified focus overlying the
anterior left frontal lobe, suspected small incidental meningioma.

There is no acute infarct.

No extra-axial fluid collection.

Vascular: Reported below.

Skull and upper cervical spine: No focal marrow lesion. Cervical
spondylosis. Ligamentous hypertrophy/pannus formation posterior to
the dens.

Sinuses/Orbits: Prior lens replacements. Trace ethmoid sinus mucosal
thickening.

Other: Left parietooccipital scalp soft tissue swelling.

MRA HEAD FINDINGS

The intracranial internal carotid arteries are patent.
Moderate/severe atherosclerotic narrowing of the cavernous segments
bilaterally, progressed as compared to the MRA of 09/26/2009. No M2
proximal branch occlusion. Atherosclerotic irregularity of the M2
and more distal MCA branch vessels bilaterally. Most notably, there
is a moderate stenosis within a proximal M2 left MCA branch (series
2282, image 4). The anterior cerebral arteries are patent.

The intracranial vertebral arteries are patent. The basilar artery
is patent. The posterior cerebral arteries are patent. Mild
atherosclerotic narrowing of the proximal left PCA (series 8530,
image 15). Mild/moderate narrowing of the left PCA at the P3/P4
junction. Posterior communicating arteries are present bilaterally.

These results were called by telephone at the time of interpretation
on 02/08/2020 at [DATE] to provider Dr. Shalley, who verbally
acknowledged these results.
IMPRESSION: MRI brain:

1. Thin acute subdural hematoma overlying the anterolateral right
frontal lobe, measuring up to 4 mm in thickness.
2. Moderate chronic small vessel ischemic disease, stable as
compared to the brain MRI of 11/06/2019.
[DATE]. Suspected 5 mm incidental meningioma overlying the anterior left
frontal lobe, unchanged.
4. Left parietooccipital scalp soft tissue swelling.

MRA head:

1. No intracranial large vessel occlusion.
2. Intracranial atherosclerotic disease most notably as follows.
3. Moderate/severe stenosis of the cavernous internal carotid
arteries bilaterally, progressed as compared to the MRA of
09/26/2009.
4. Moderate focal stenosis within a proximal M2 left MCA branch
vessel.
5. Mild/moderate stenosis of the left PCA at the P3/P4 junction.

## 2021-08-03 IMAGING — CT CT CERVICAL SPINE W/O CM
3 of 4 series · 13 of 33 positions shown, 16 images · non-contrast
Comparison: CT head 11/26/2019

CLINICAL DATA: Head trauma

EXAM:
CT HEAD WITHOUT CONTRAST
CT CERVICAL SPINE WITHOUT CONTRAST
TECHNIQUE: Multidetector CT imaging of the head and cervical spine was
performed following the standard protocol without intravenous
contrast. Multiplanar CT image reconstructions of the cervical spine
were also generated.

[Series 4: c_spine 2.0 st · axial · 0.36mm/px · z∈[-383,-239]mm · 5 of 109 slices shown, 7 images]
[im 19/109  soft-tissue]
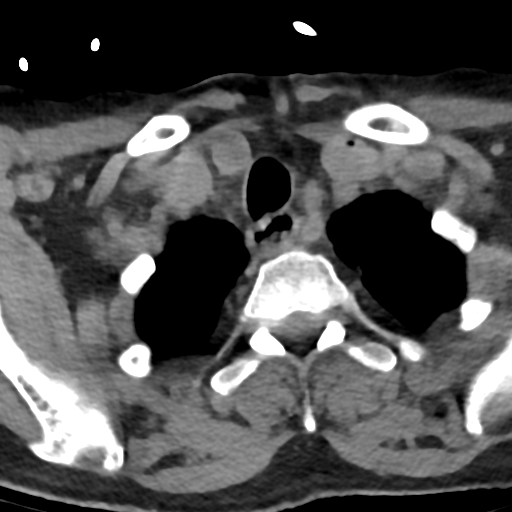
[im 19/109  bone]
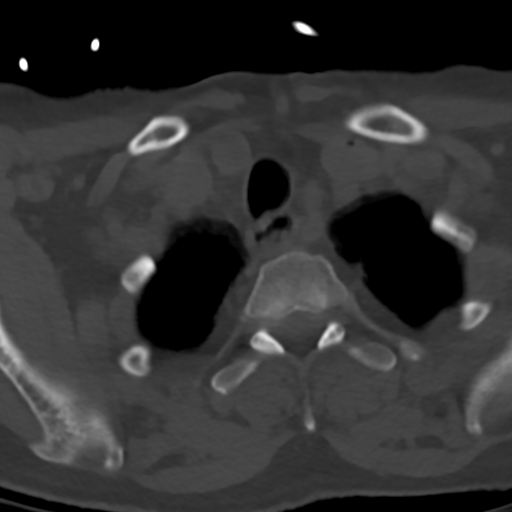
[im 37/109  bone]
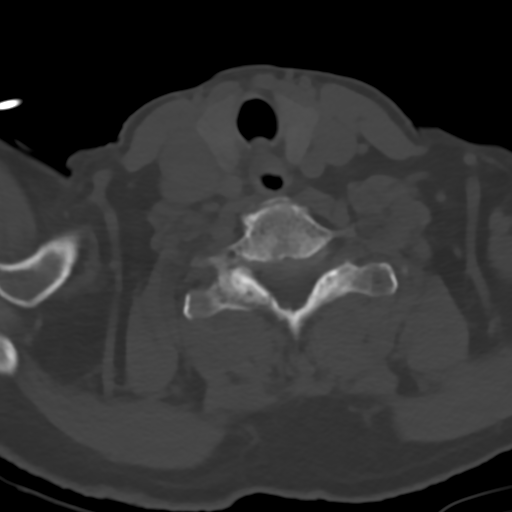
[im 55/109  bone]
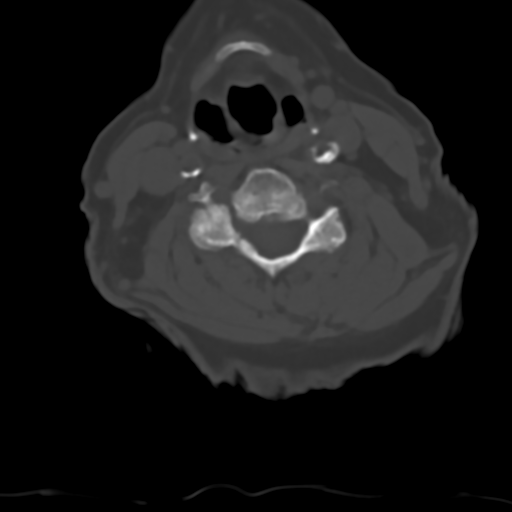
[im 73/109  bone]
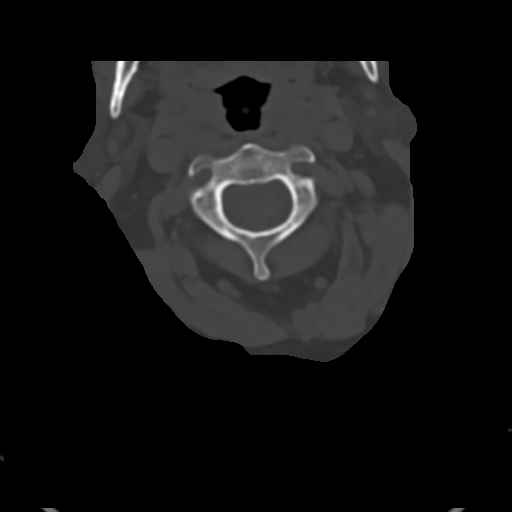
[im 91/109  soft-tissue]
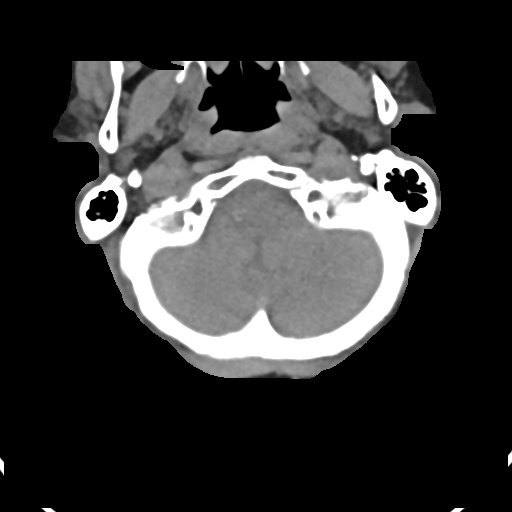
[im 91/109  bone]
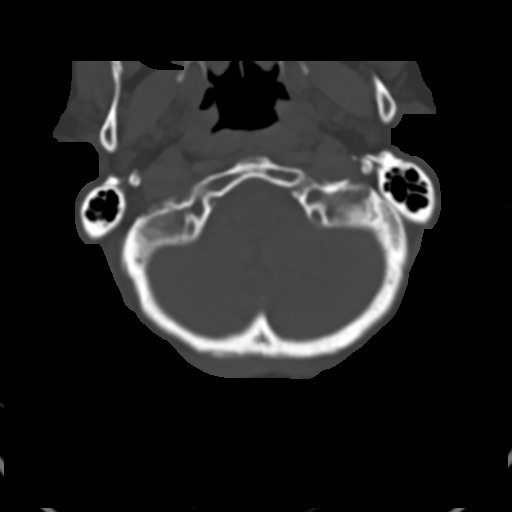

[Series 6: c_spine 2.0 sag bone · sagittal · 0.37mm/px · 5 of 61 slices shown, 6 images]
[im 21/61  bone]
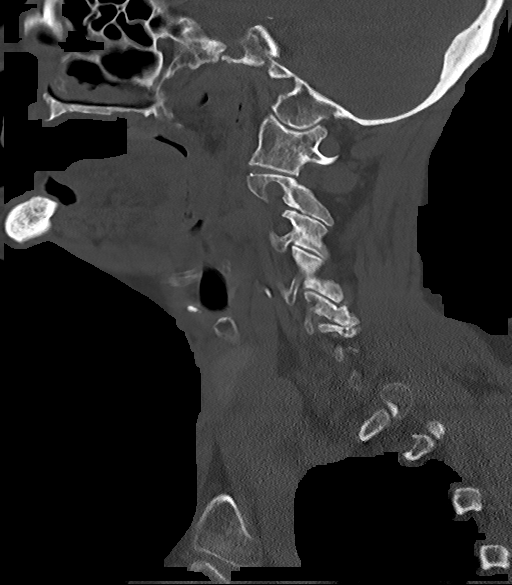
[im 26/61  bone]
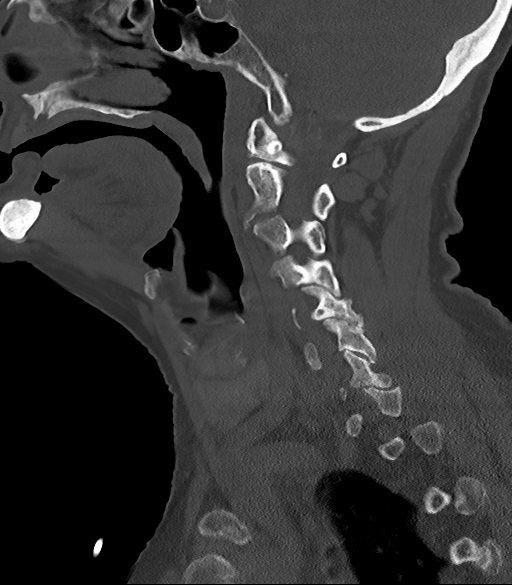
[im 31/61  soft-tissue]
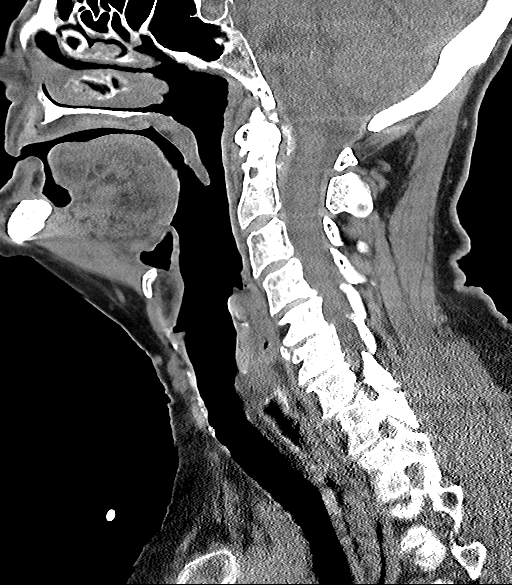
[im 31/61  bone]
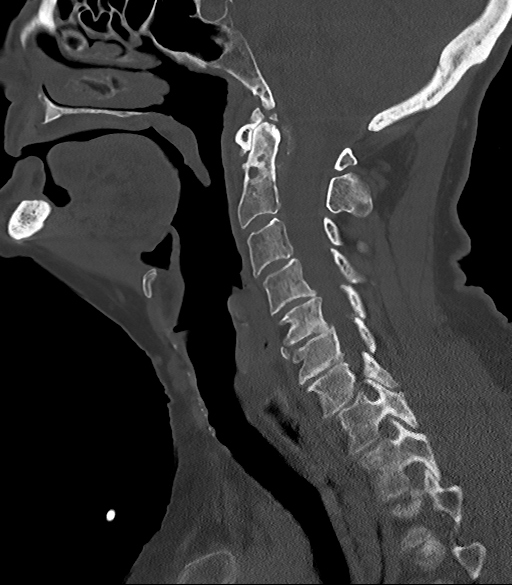
[im 36/61  bone]
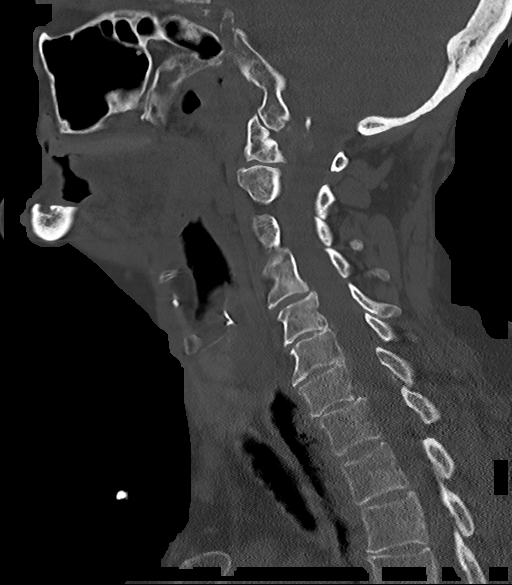
[im 41/61  bone]
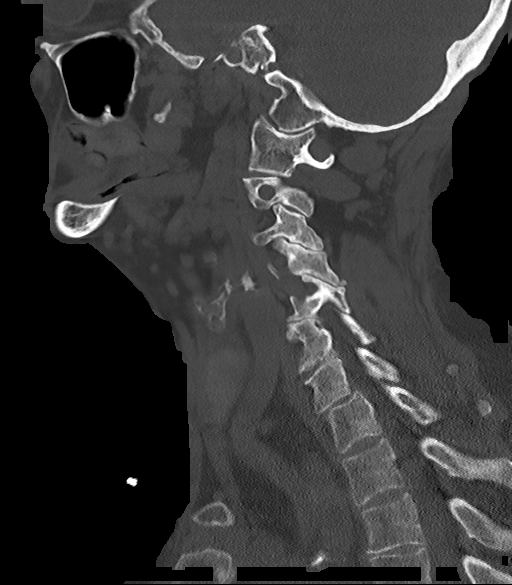

[Series 7: c_spine 2.0 cor bone · coronal · 0.32mm/px · 3 of 84 slices shown]
[im 17/84  bone]
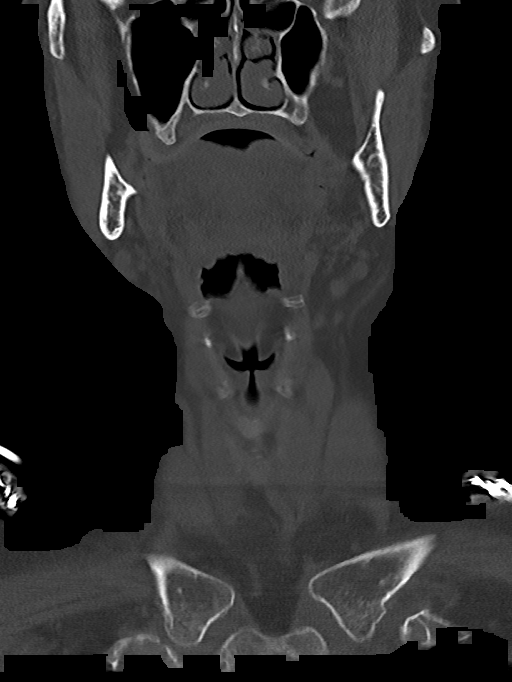
[im 34/84  bone]
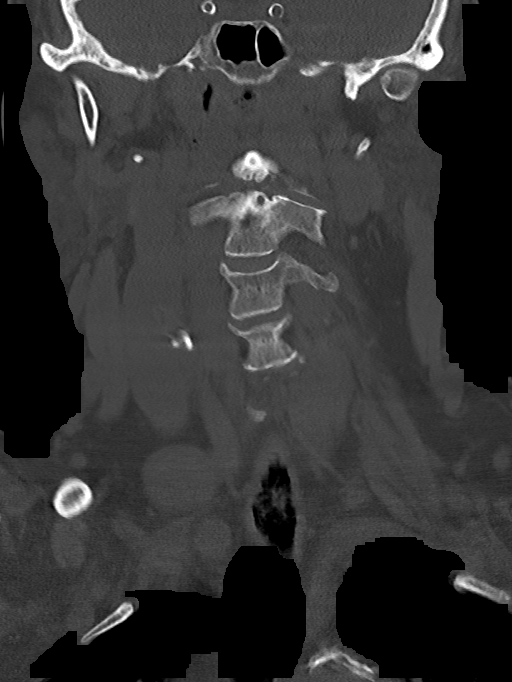
[im 50/84  bone]
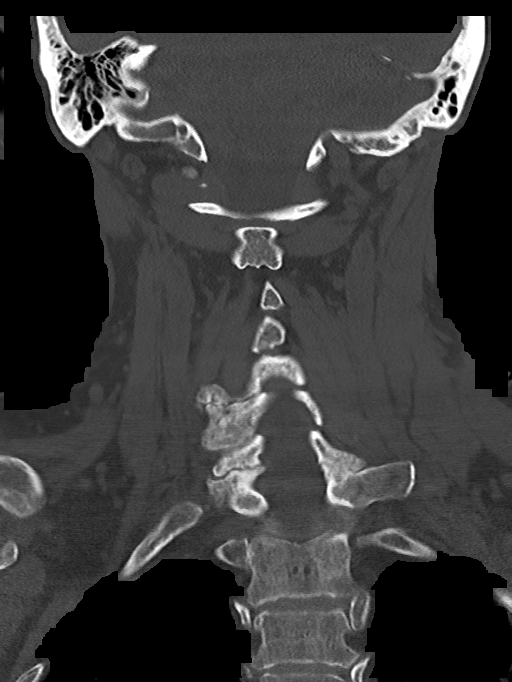

[13 of 33 positions shown; findings below may reference images not displayed]

FINDINGS: CT HEAD FINDINGS

Brain: Normal anatomic configuration of the brain. There is interval
development of a small cortical infarct involving the right frontal
lobe involving the precentral gyrus, best seen on axial image #
[DATE]. No significant associated mass effect. No midline shift. Mild
periventricular white matter changes are again identified in keeping
with probable small vessel ischemia. No abnormal intra or
extra-axial mass lesion. No evidence of acute intracranial
hemorrhage. Ventricular size is normal. Cerebellum is unremarkable.

Vascular: No asymmetric hyperdense vasculature at the skull base.

Skull: Intact

Sinuses/Orbits: Visualized paranasal sinuses are clear. Orbits are
unremarkable.

Other: Mastoid air cells and middle ear cavities are clear.

CT CERVICAL SPINE FINDINGS

Alignment: 3 mm anterolisthesis C4 upon C5 and C7 upon T1 is likely
degenerative in nature. Otherwise normal cervical lordosis.

Skull base and vertebrae: The craniocervical junction is
unremarkable. The atlantodental interval is normal. No acute
fracture of the cervical spine. No lytic or blastic bone lesion

Soft tissues and spinal canal: No prevertebral fluid or swelling. No
visible canal hematoma.

Disc levels: Review of the sagittal images demonstrates preservation
of vertebral body height. There is advanced atlantodental
degenerative arthritis. Multiple erosions are seen at the base of
the odontoid process likely related to synovial overgrowth. There is
diffuse intervertebral disc space narrowing and endplate remodeling
throughout the cervical spine, most severe at C5-T1 in keeping with
changes of moderate to severe degenerative disc disease. The spinal
canal appears widely patent. The prevertebral soft tissues are not
thickened. Review of the axial images demonstrates multilevel
uncovertebral and facet arthrosis resulting in multilevel neural
foraminal narrowing, most severe on the left at C3-4 and on the
right at C4-5 as well as mild central canal stenosis at C6-7.

Upper chest: Unremarkable

Other: Advanced atherosclerotic calcification is seen within the
left carotid bifurcation. The degree of stenosis is not well
assessed on this noncontrast examination.
IMPRESSION: No acute intracranial injury.  No calvarial fracture.

Interval development of a subacute to remote cortical infarct
involving the precentral gyrus of the right frontal lobe.

No acute fracture or listhesis of the cervical spine.

## 2021-08-03 IMAGING — MR MR MRA NECK W/O CM
4 of 7 series · 41 of 48 positions shown · non-contrast
Comparison: MRA neck 09/26/2009.

CLINICAL DATA: Stroke, follow-up.

EXAM:
MRA NECK WITHOUT CONTRAST
TECHNIQUE: Angiographic images of the neck were obtained using MRA technique
without intravenous contrast. Carotid stenosis measurements (when
applicable) are obtained utilizing NASCET criteria, using the distal
internal carotid diameter as the denominator.

[Series 10: tof_fl3d_tra_iso · axial · 0.6mm · 0.52mm/px · z∈[-153,-74]mm · 23 of 133 slices shown]
[im 1/133]
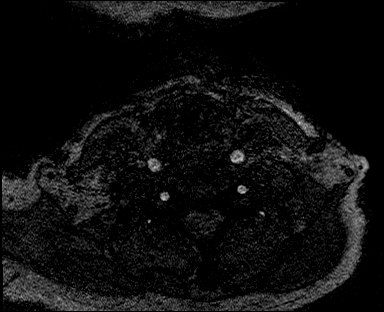
[im 7/133]
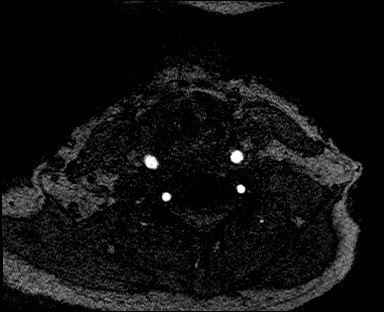
[im 13/133]
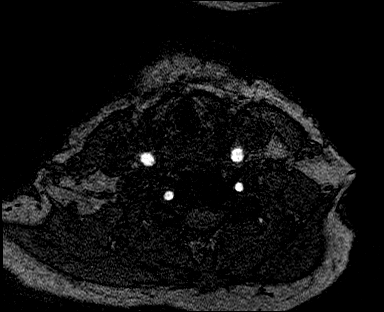
[im 19/133]
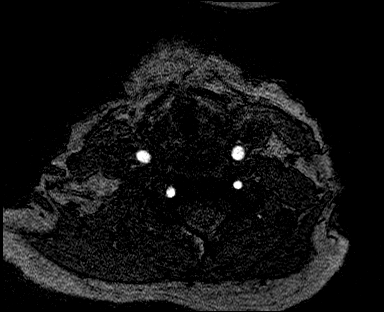
[im 25/133]
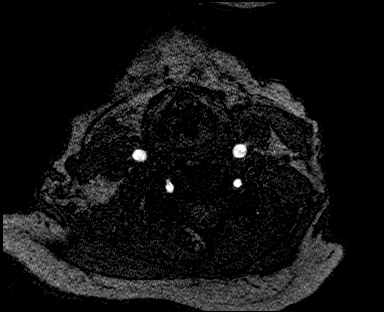
[im 31/133]
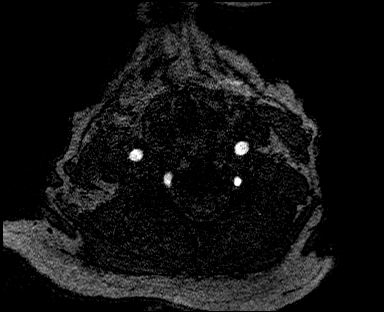
[im 37/133]
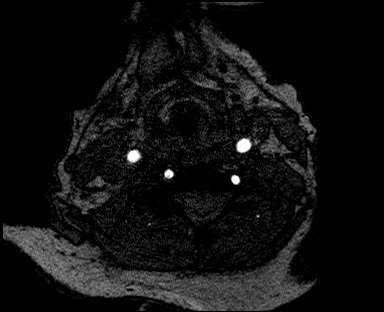
[im 43/133]
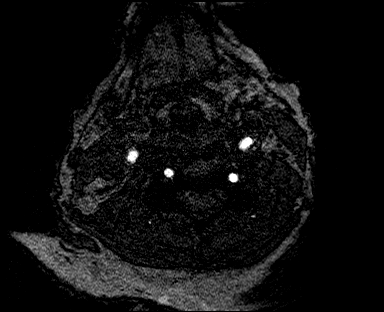
[im 49/133]
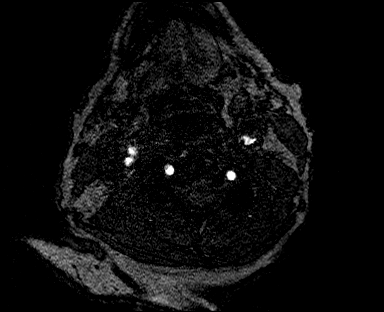
[im 55/133]
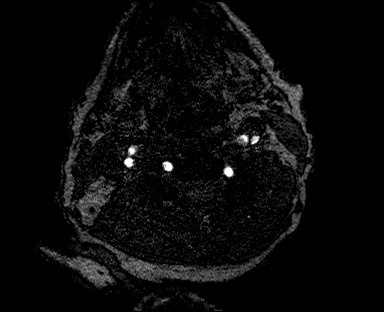
[im 61/133]
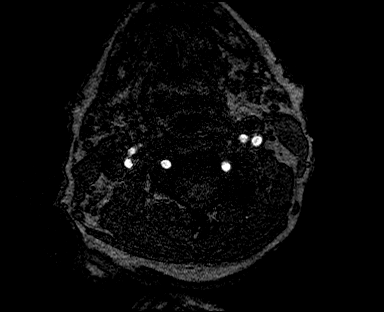
[im 67/133]
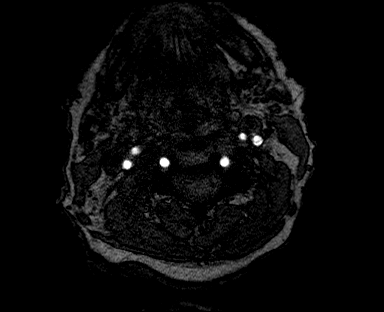
[im 73/133]
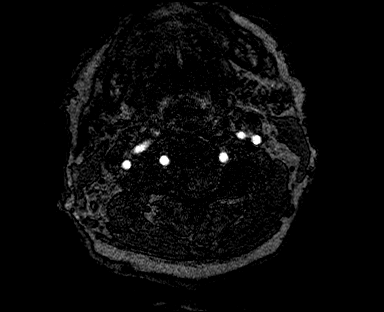
[im 79/133]
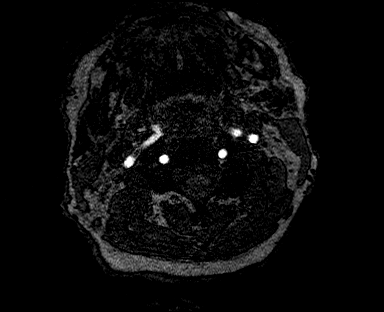
[im 85/133]
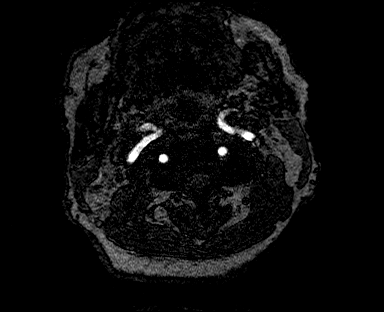
[im 91/133]
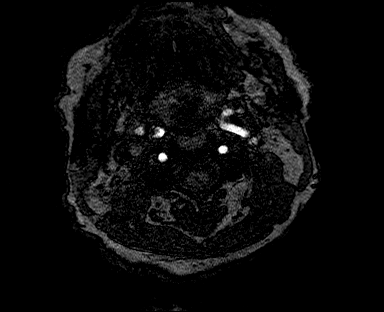
[im 97/133]
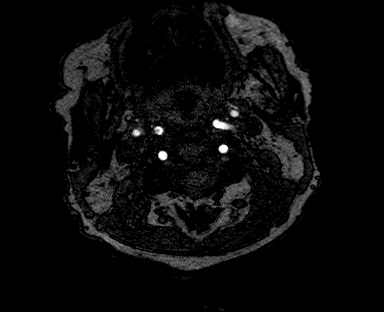
[im 103/133]
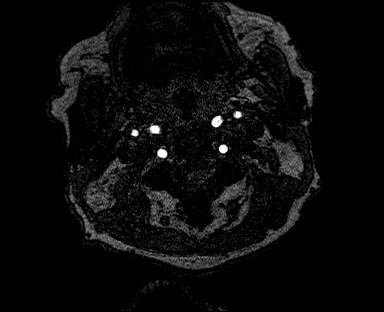
[im 109/133]
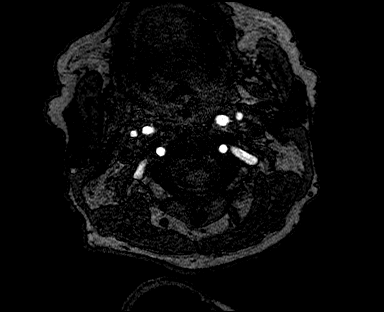
[im 115/133]
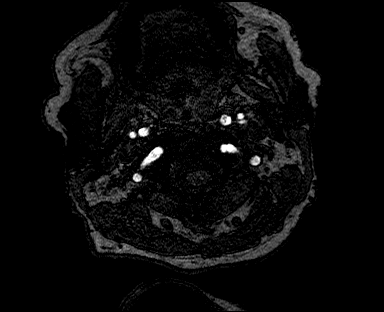
[im 121/133]
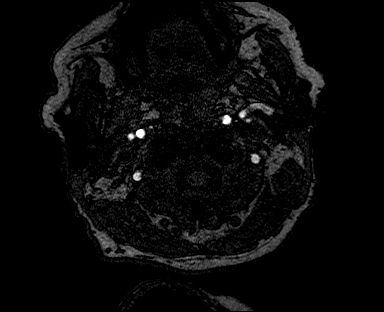
[im 127/133]
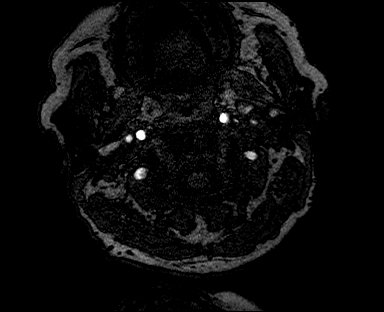
[im 133/133]
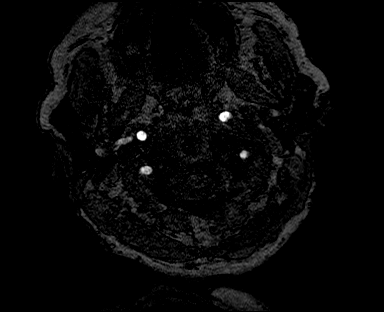

[Series 13: tof_fl2d_tra · axial · 3.0mm · 0.78mm/px · z∈[-255,-23]mm · 16 of 105 slices shown]
[im 1/105]
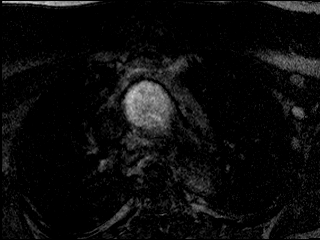
[im 7/105]
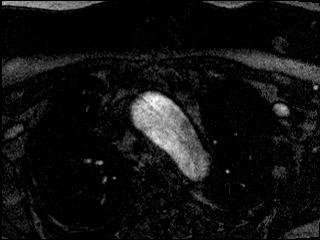
[im 13/105]
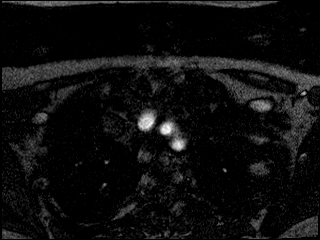
[im 19/105]
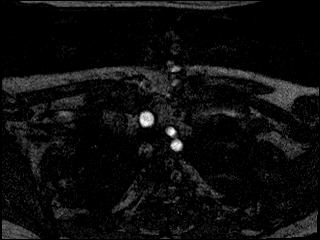
[im 25/105]
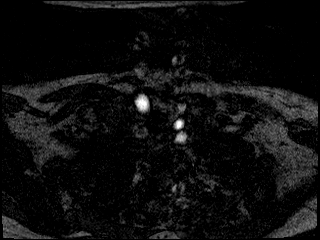
[im 31/105]
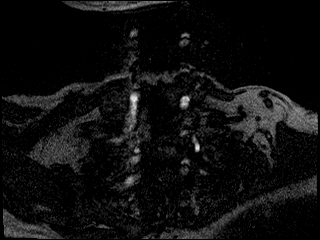
[im 37/105]
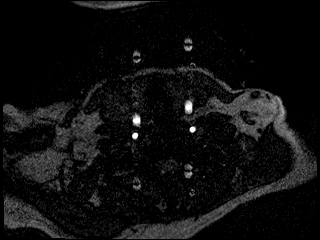
[im 43/105]
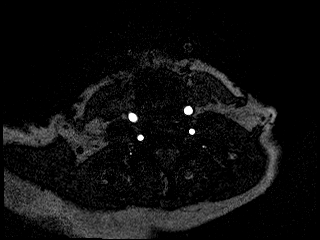
[im 49/105]
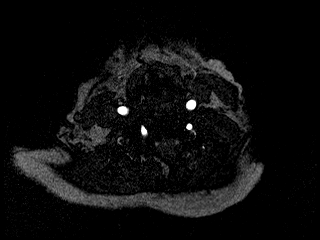
[im 56/105]
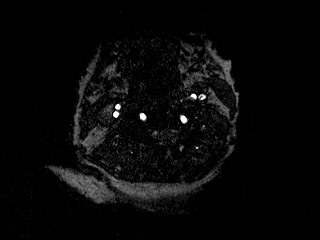
[im 62/105]
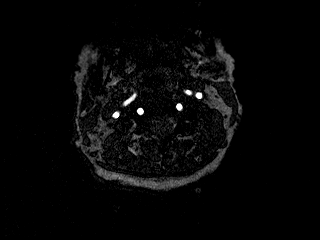
[im 68/105]
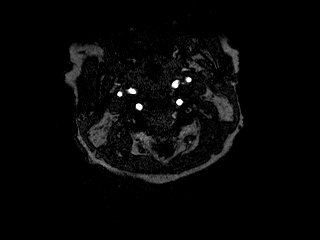
[im 74/105]
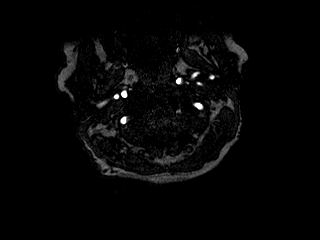
[im 86/105]
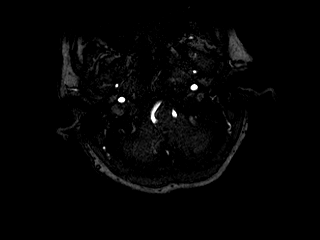
[im 92/105]
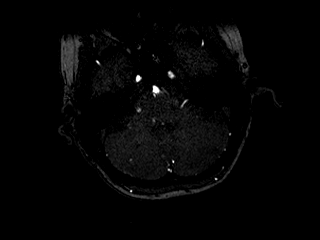
[im 98/105]
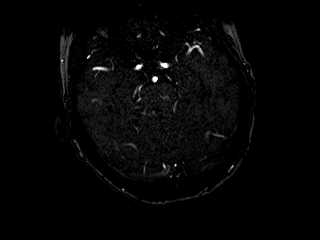

[Series 17: tof_fl2d_tra_mip_radials · axial · 0.78mm/px · 1 of 2 slices shown]
[im 1/2]
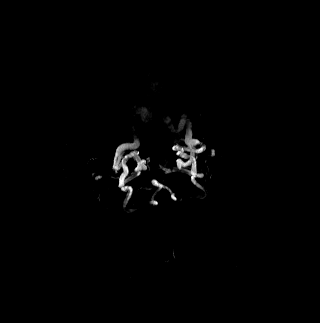

[Series 1104: rt carotid a/p · axial · 0.8mm · 0.27mm/px · 1 of 2 slices shown]
[im 1/2]
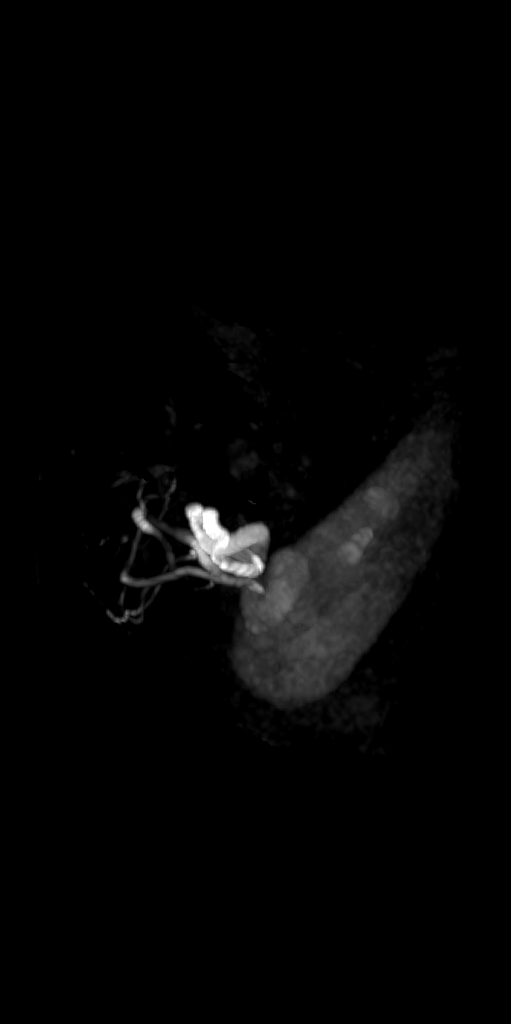

[41 of 48 positions shown; findings below may reference images not displayed]

FINDINGS: The right CCA and cervical ICA are patent without hemodynamically
significant stenosis. Mild atherosclerotic irregularity of the
proximal right ICA, progressed as compared to the MRA of 09/26/2009.

The left CCA and cervical ICA are patent. On the source
time-of-flight sequence, there is apparent 60% stenosis at the
origin of the left ICA, progressed as compared to the prior exam.
However, this degree of stenosis is not definitively appreciable on
the MIP reconstructions.

Limited evaluation of the proximal vertebral arteries due to motion
degradation and noncontrast technique. The vertebral arteries are
otherwise patent within the neck without hemodynamically significant
stenosis.
IMPRESSION: 1. Apparent 60% stenosis at the origin of the left ICA on the source
time-of-flight sequence, progressed from the MRA of 09/26/2009.
However, this degree of stenosis is not definitively appreciated on
the MIP reconstructions. Carotid artery duplex may be obtained for
further evaluation, as clinically warranted.
2. The right CCA and ICA are patent within the neck without
hemodynamically significant stenosis. Progressive mild
atherosclerotic irregularity of the proximal right ICA.
3. Motion degradation and non-contrast technique preclude evaluation
of the proximal vertebral arteries. The vertebral arteries are
otherwise patent within the neck without significant stenosis.

## 2021-08-03 IMAGING — CT CT HEAD W/O CM
3 of 4 series · 13 of 47 positions shown, 15 images · non-contrast
Comparison: CT head 11/26/2019

CLINICAL DATA: Head trauma

EXAM:
CT HEAD WITHOUT CONTRAST
CT CERVICAL SPINE WITHOUT CONTRAST
TECHNIQUE: Multidetector CT imaging of the head and cervical spine was
performed following the standard protocol without intravenous
contrast. Multiplanar CT image reconstructions of the cervical spine
were also generated.

[Series 3: head without · axial · non-contrast · 0.44mm/px · z∈[-239,-109]mm · 7 of 36 slices shown, 9 images]
[im 5/36  brain]
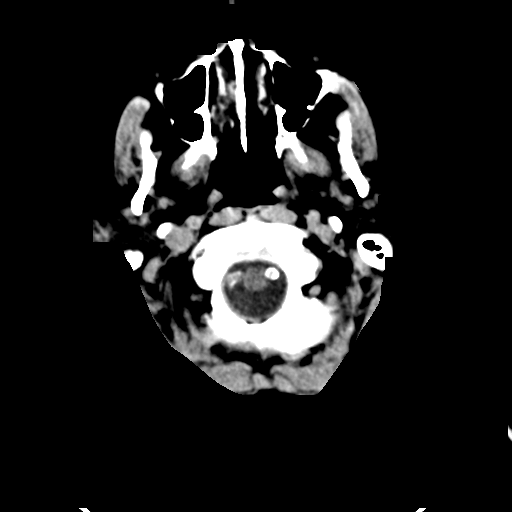
[im 5/36  bone]
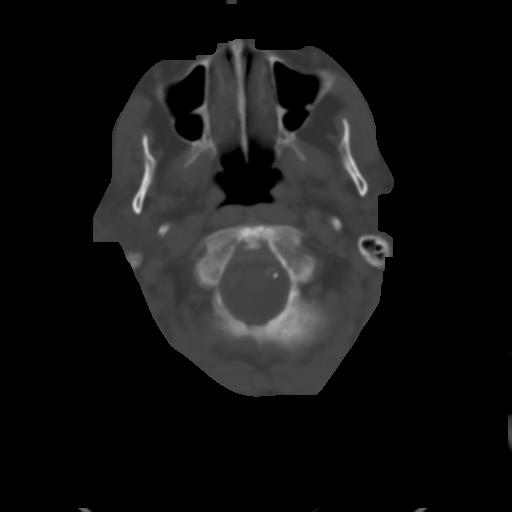
[im 9/36  brain]
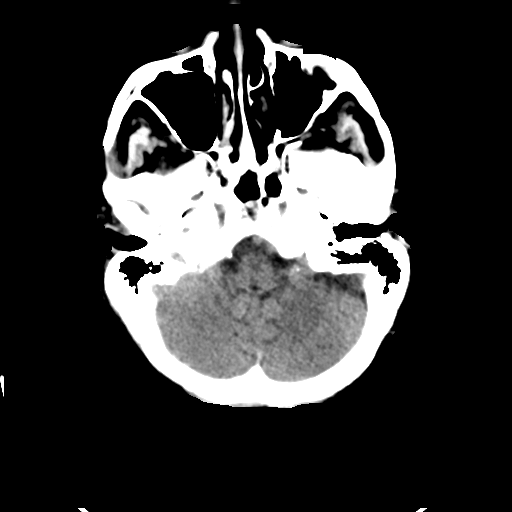
[im 14/36  brain]
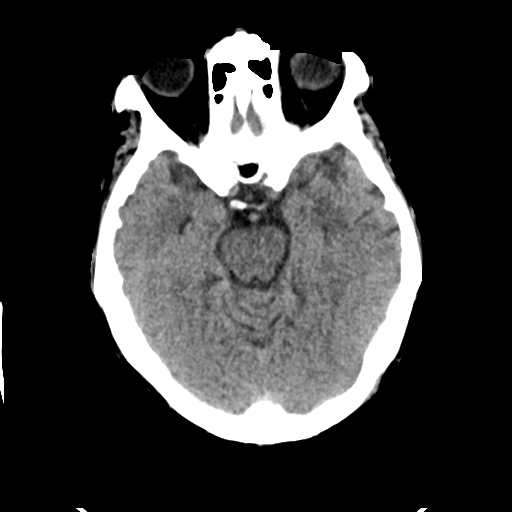
[im 18/36  brain]
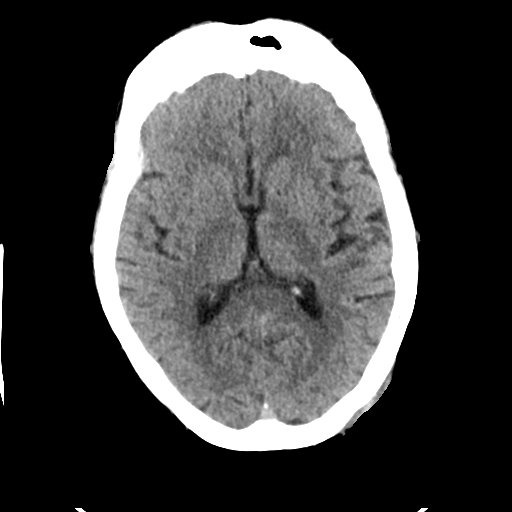
[im 22/36  brain]
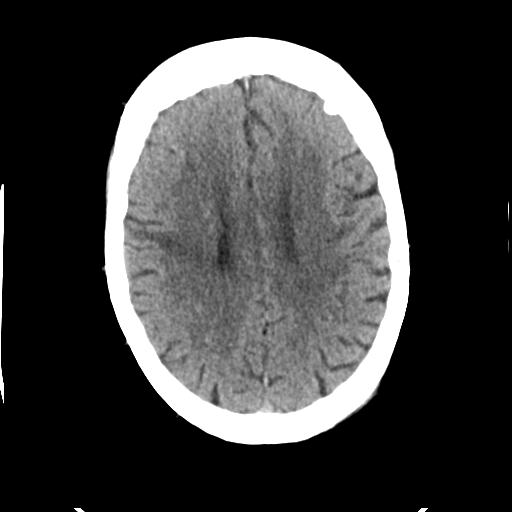
[im 22/36  bone]
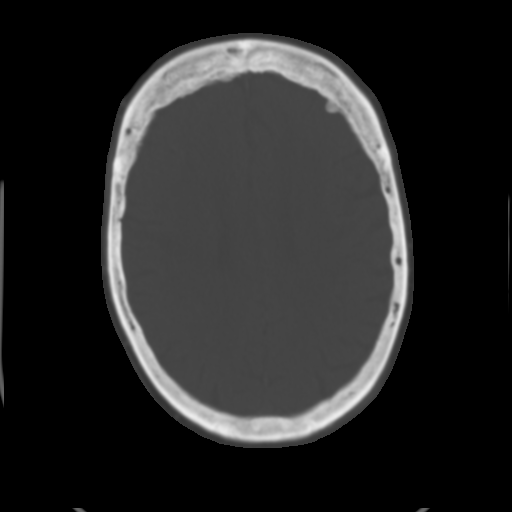
[im 27/36  brain]
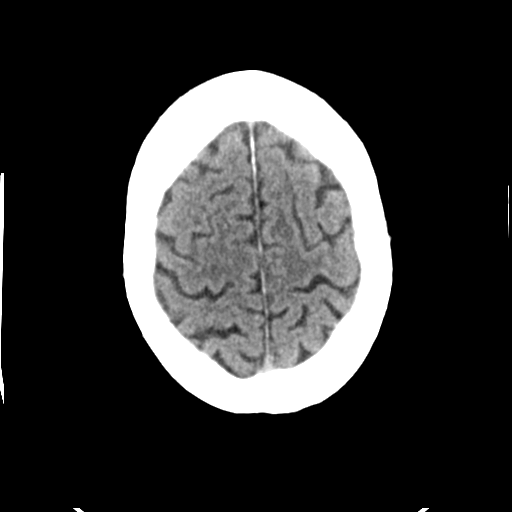
[im 31/36  brain]
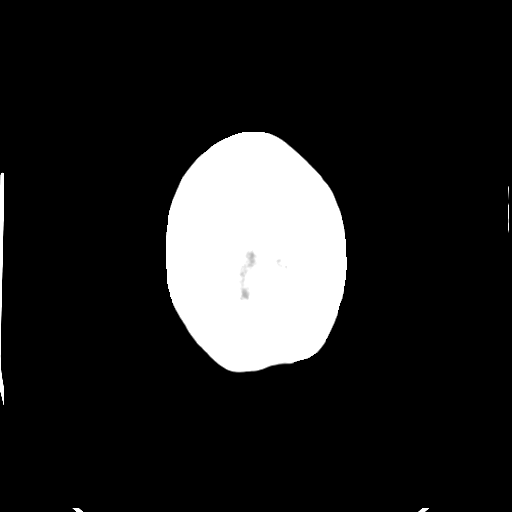

[Series 5: head without cor · coronal · non-contrast · 0.41mm/px · 3 of 75 slices shown]
[im 25/75  brain]
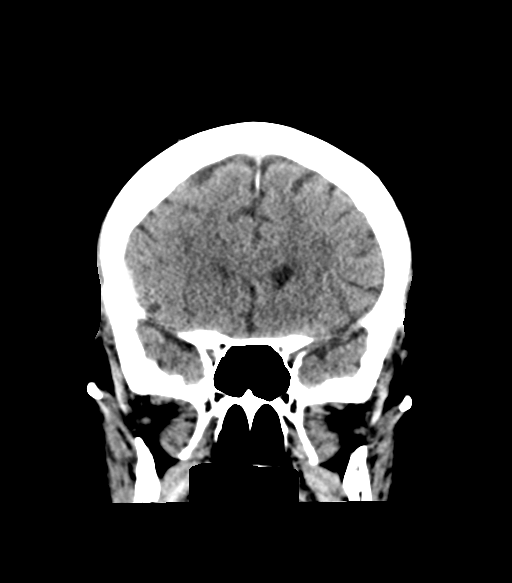
[im 33/75  brain]
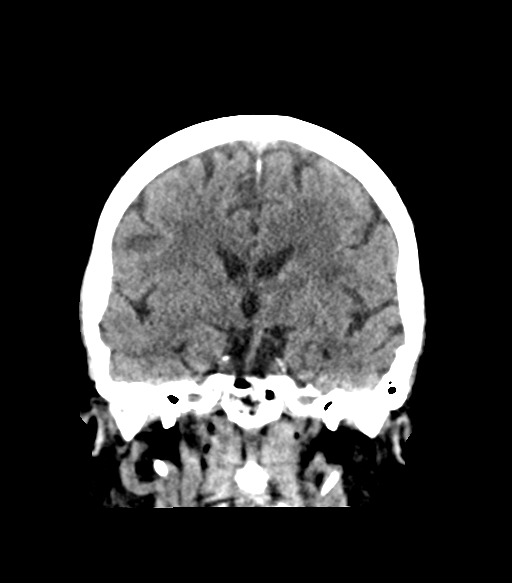
[im 42/75  brain]
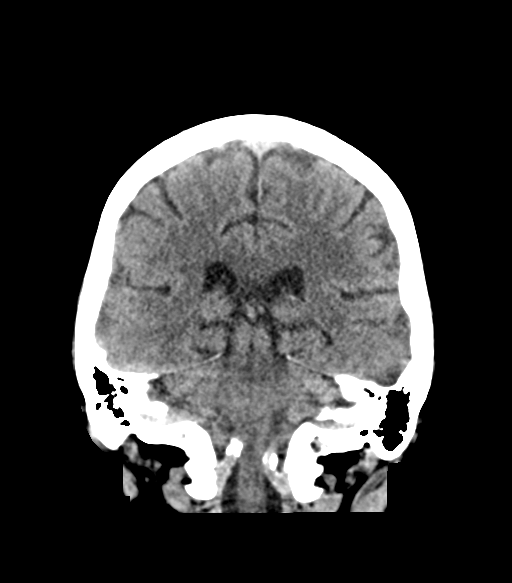

[Series 6: head without sag · sagittal · non-contrast · 0.34mm/px · 3 of 65 slices shown]
[im 22/65  brain]
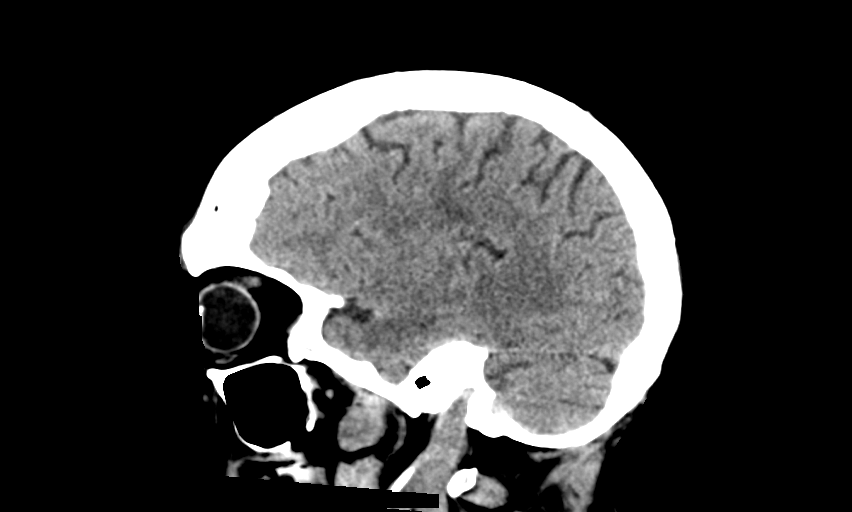
[im 33/65  brain]
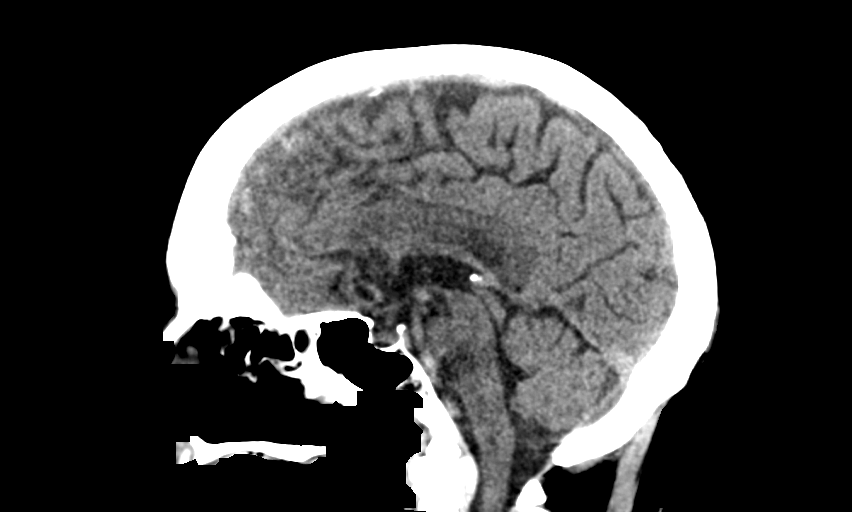
[im 43/65  brain]
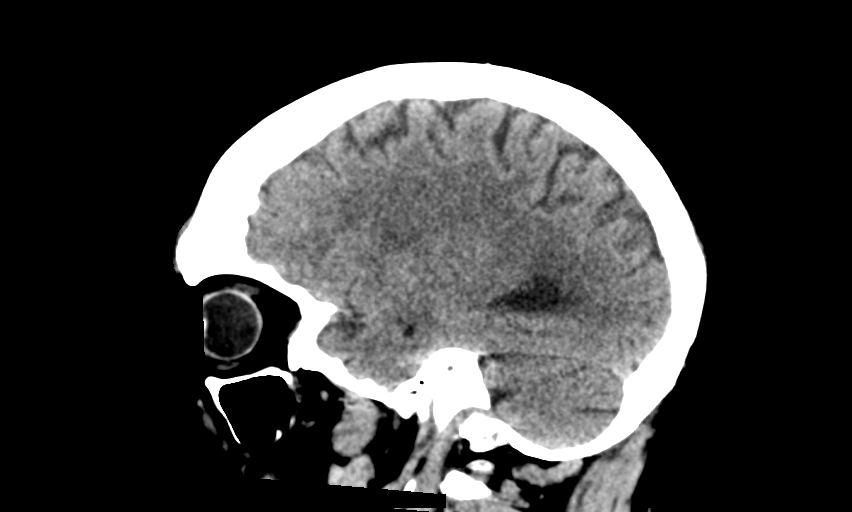

[13 of 47 positions shown; findings below may reference images not displayed]

FINDINGS: CT HEAD FINDINGS

Brain: Normal anatomic configuration of the brain. There is interval
development of a small cortical infarct involving the right frontal
lobe involving the precentral gyrus, best seen on axial image #
[DATE]. No significant associated mass effect. No midline shift. Mild
periventricular white matter changes are again identified in keeping
with probable small vessel ischemia. No abnormal intra or
extra-axial mass lesion. No evidence of acute intracranial
hemorrhage. Ventricular size is normal. Cerebellum is unremarkable.

Vascular: No asymmetric hyperdense vasculature at the skull base.

Skull: Intact

Sinuses/Orbits: Visualized paranasal sinuses are clear. Orbits are
unremarkable.

Other: Mastoid air cells and middle ear cavities are clear.

CT CERVICAL SPINE FINDINGS

Alignment: 3 mm anterolisthesis C4 upon C5 and C7 upon T1 is likely
degenerative in nature. Otherwise normal cervical lordosis.

Skull base and vertebrae: The craniocervical junction is
unremarkable. The atlantodental interval is normal. No acute
fracture of the cervical spine. No lytic or blastic bone lesion

Soft tissues and spinal canal: No prevertebral fluid or swelling. No
visible canal hematoma.

Disc levels: Review of the sagittal images demonstrates preservation
of vertebral body height. There is advanced atlantodental
degenerative arthritis. Multiple erosions are seen at the base of
the odontoid process likely related to synovial overgrowth. There is
diffuse intervertebral disc space narrowing and endplate remodeling
throughout the cervical spine, most severe at C5-T1 in keeping with
changes of moderate to severe degenerative disc disease. The spinal
canal appears widely patent. The prevertebral soft tissues are not
thickened. Review of the axial images demonstrates multilevel
uncovertebral and facet arthrosis resulting in multilevel neural
foraminal narrowing, most severe on the left at C3-4 and on the
right at C4-5 as well as mild central canal stenosis at C6-7.

Upper chest: Unremarkable

Other: Advanced atherosclerotic calcification is seen within the
left carotid bifurcation. The degree of stenosis is not well
assessed on this noncontrast examination.
IMPRESSION: No acute intracranial injury.  No calvarial fracture.

Interval development of a subacute to remote cortical infarct
involving the precentral gyrus of the right frontal lobe.

No acute fracture or listhesis of the cervical spine.

## 2021-08-03 IMAGING — DX DG PORTABLE PELVIS
1 series · 1 of 1 positions shown · non-contrast
Comparison: None.

CLINICAL DATA: Pelvic trauma

EXAM:
PORTABLE PELVIS 1-2 VIEWS

[pelvis]
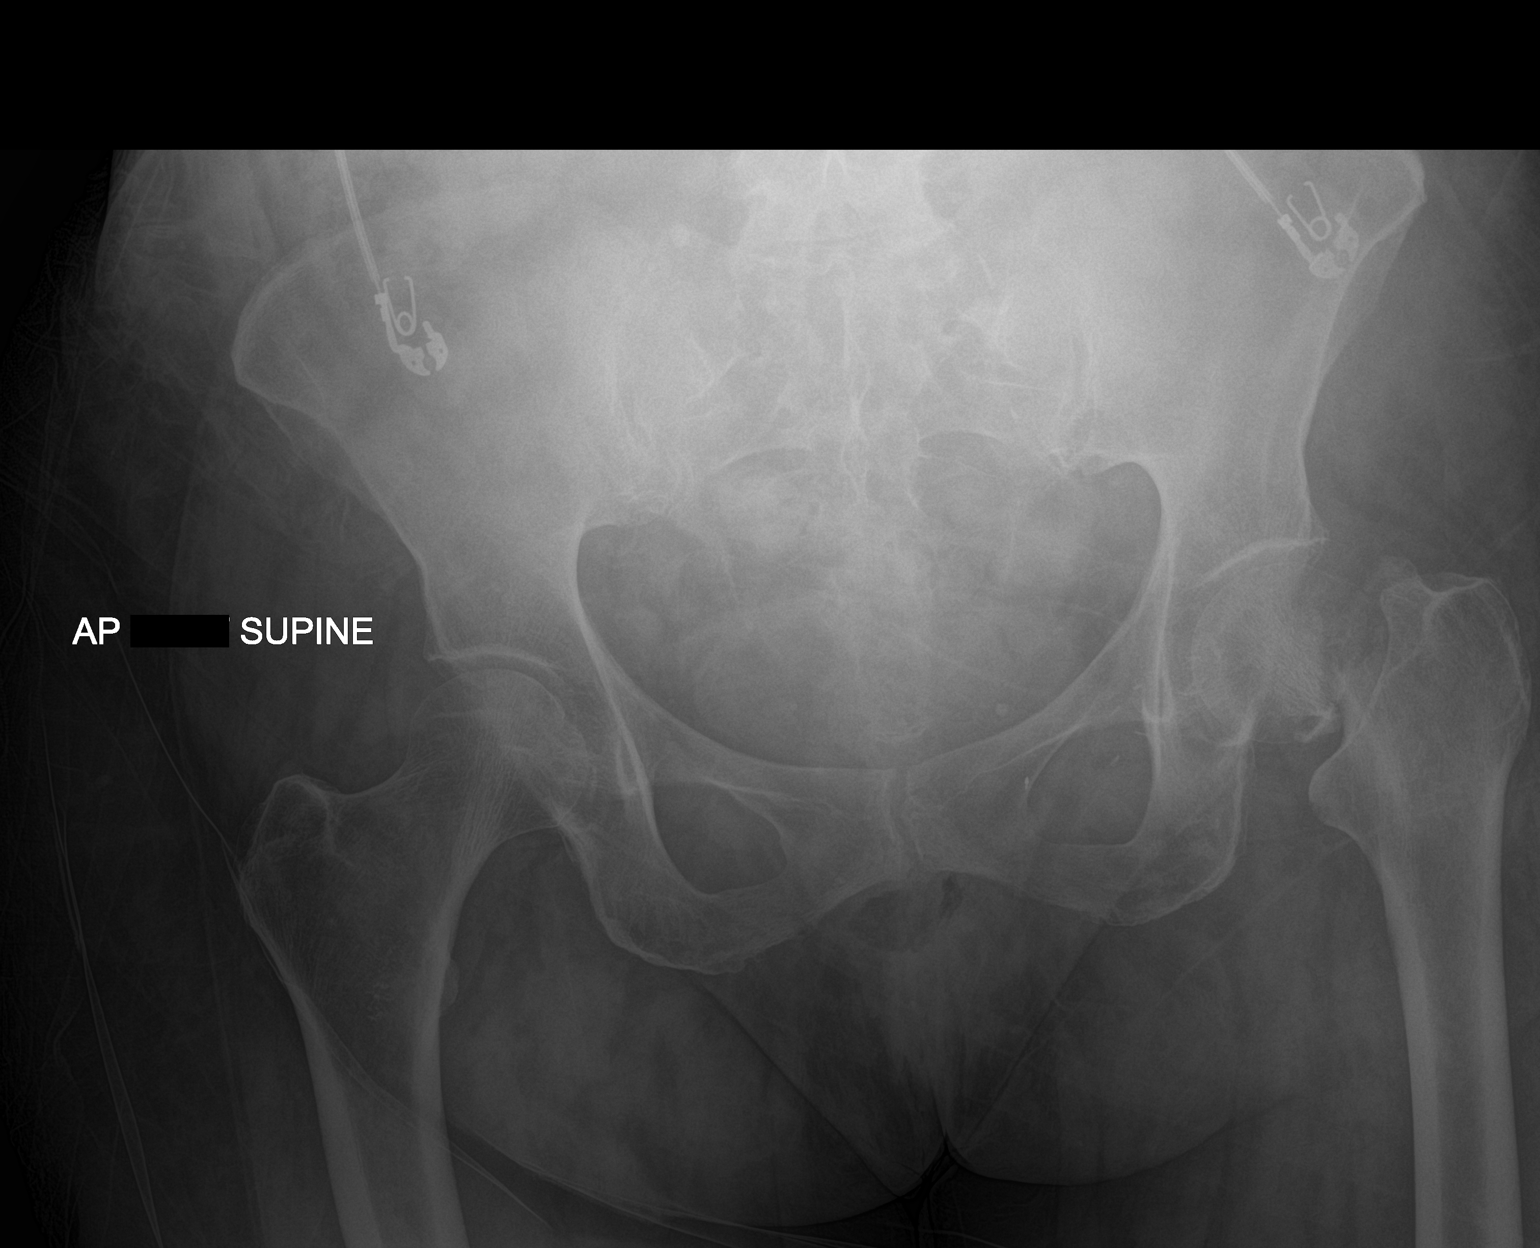

[1 of 1 positions shown; findings below may reference images not displayed]

FINDINGS: Single view radiograph the pelvis demonstrates an acute, left
subcapital femoral neck fracture with superior displacement, varus
angulation, and external rotation of the distal fracture fragment.
The femoral head is still seated within the left acetabulum. Mild
bilateral hip joint space narrowing is present in keeping with mild
bilateral degenerative arthritis. Pelvis and sacrum appear intact.
Limited evaluation of the right hip is unremarkable.
IMPRESSION: Acute, displaced, angulated subcapital left femoral neck fracture.

## 2021-08-03 IMAGING — DX DG CHEST 1V PORT
1 series · 1 of 1 positions shown · non-contrast
Comparison: 10/04/2018

CLINICAL DATA: Chest trauma

EXAM:
PORTABLE CHEST 1 VIEW

[chest]
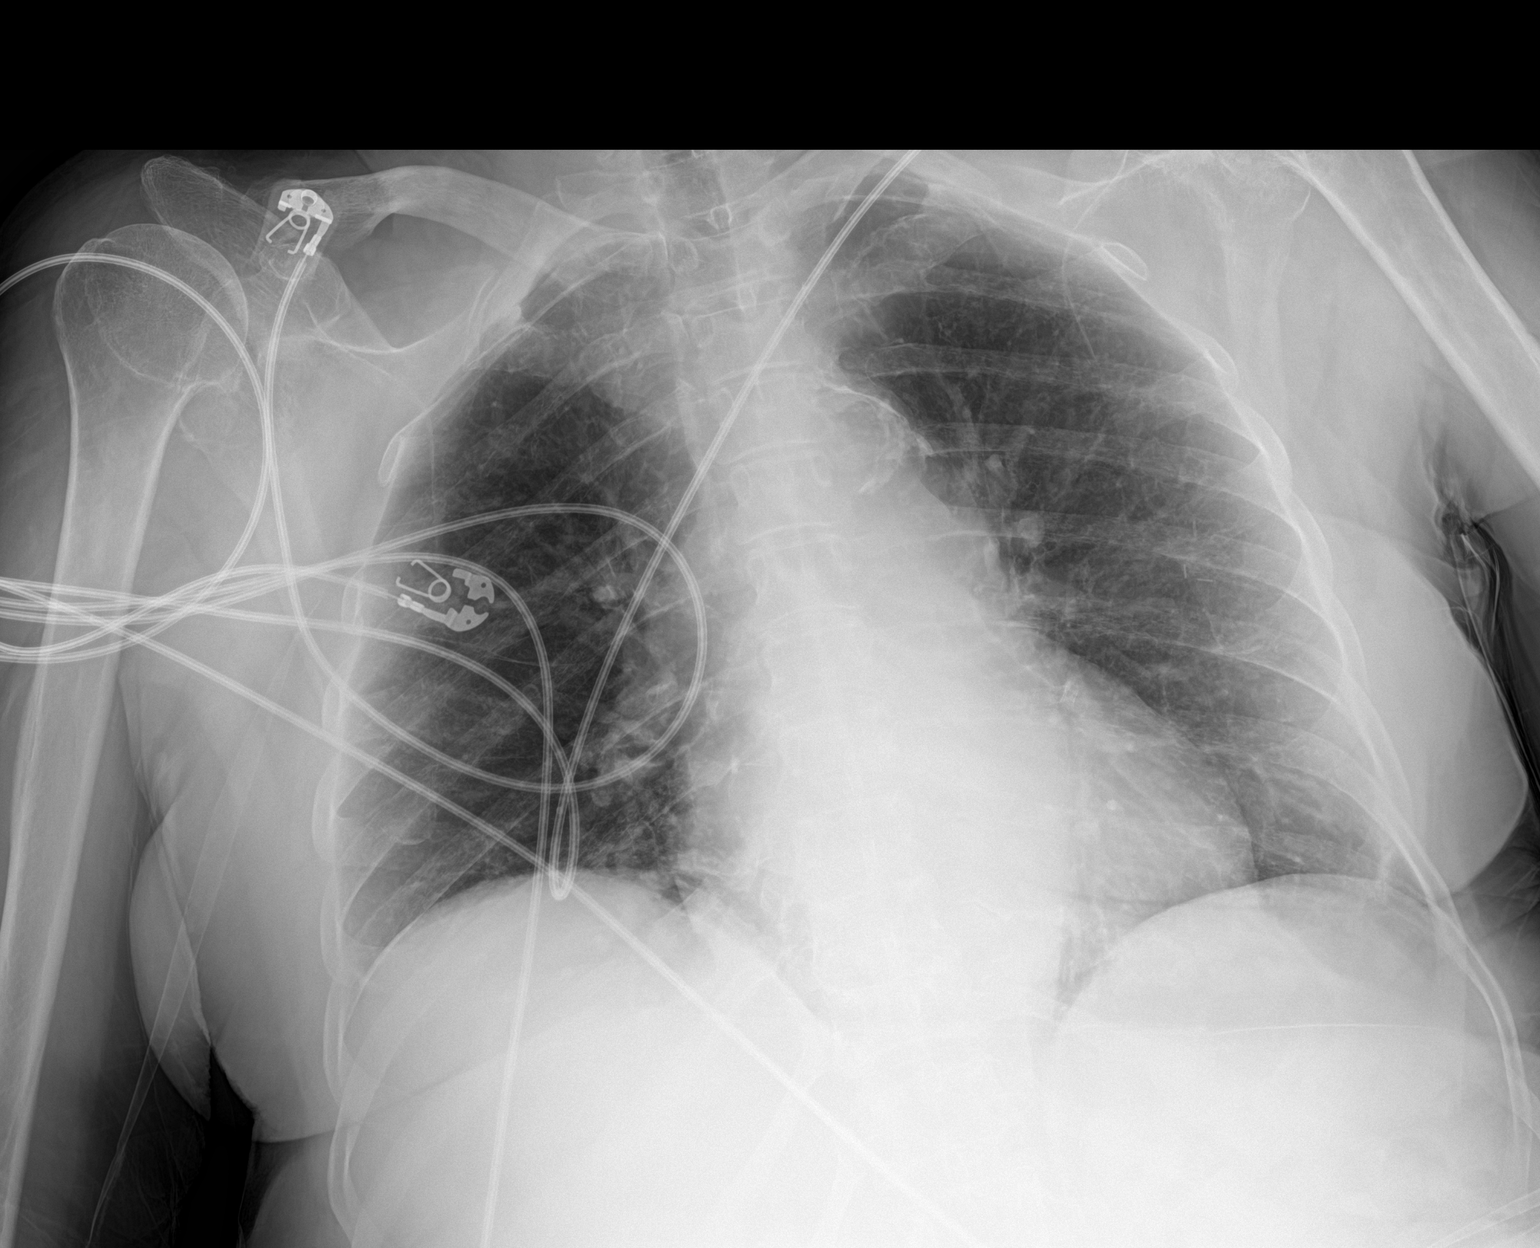

[1 of 1 positions shown; findings below may reference images not displayed]

FINDINGS: The heart size and mediastinal contours are within normal limits.
Both lungs are clear. The visualized skeletal structures are
unremarkable.
IMPRESSION: No active disease.

## 2021-08-03 IMAGING — MR MR HEAD W/O CM
12 of 13 series · 44 of 48 positions shown · non-contrast
Comparison: Noncontrast head CT 02/08/2020. Brain MRI 11/06/2019.
MRA head 09/26/2009.

CLINICAL DATA: Neuro deficit, acute, stroke suspected. Additional
history provided: Fall at home.

EXAM:
MRI HEAD WITHOUT CONTRAST
MRA HEAD WITHOUT CONTRAST
TECHNIQUE: Multiplanar, multiecho pulse sequences of the brain and surrounding
structures were obtained without intravenous contrast. Angiographic
images of the head were obtained using MRA technique without
contrast.

[Series 5: DWI · axial · 3.0mm · 0.88mm/px · z∈[-72,+71]mm · 8 of 98 slices shown (1 of 4)]
[im 1/98]
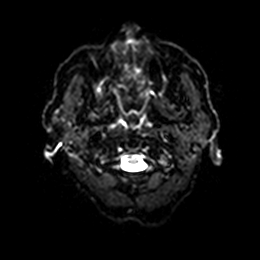
[im 14/98]
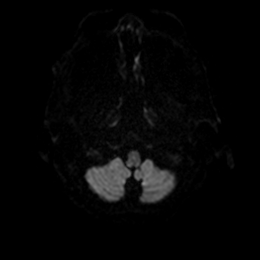
[im 28/98]
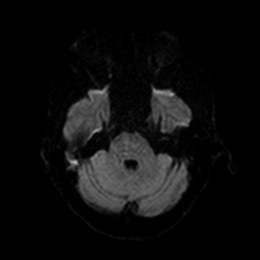
[im 42/98]
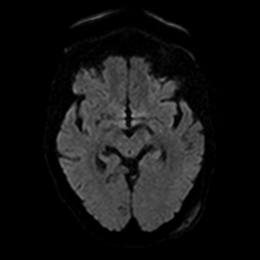
[im 56/98]
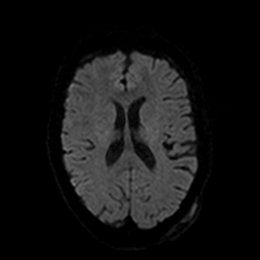
[im 70/98]
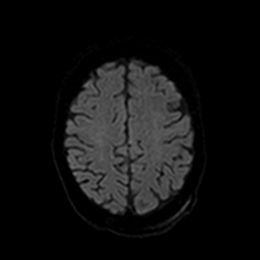
[im 84/98]
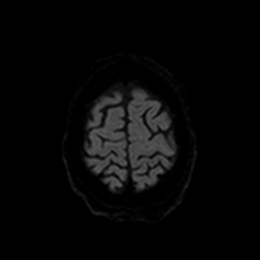
[im 98/98]
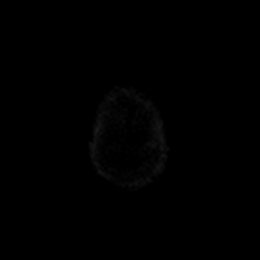

[Series 6: DWI · axial · 3.0mm · 0.88mm/px · z∈[-72,+71]mm · 4 of 44 slices shown (2 of 4)]
[im 1/44]
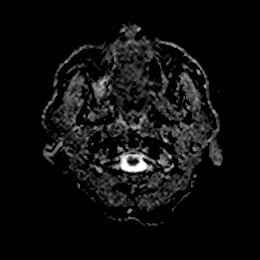
[im 15/44]
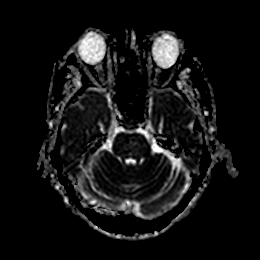
[im 29/44]
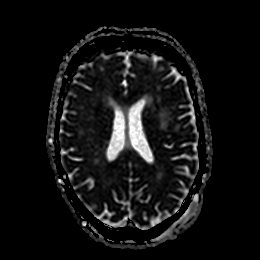
[im 44/44]
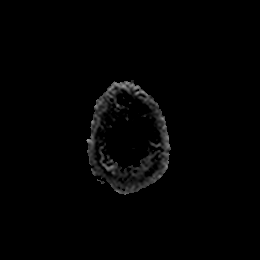

[Series 7: DWI · coronal · 4.0mm · 0.88mm/px · 5 of 72 slices shown (3 of 4)]
[im 1/72]
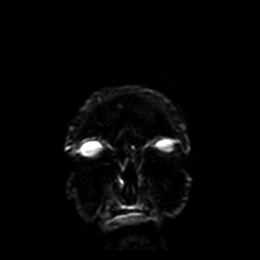
[im 18/72]
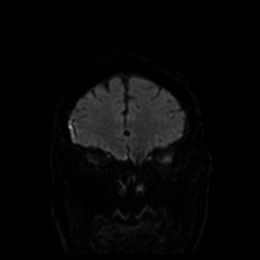
[im 36/72]
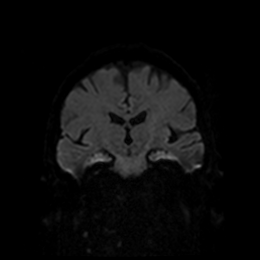
[im 54/72]
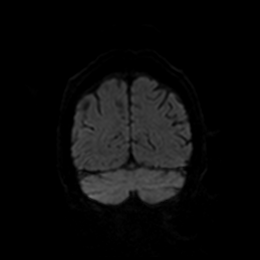
[im 72/72]
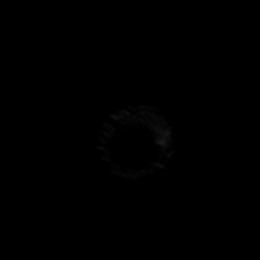

[Series 8: DWI · coronal · 4.0mm · 0.88mm/px · 3 of 36 slices shown (4 of 4)]
[im 1/36]
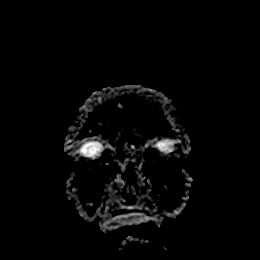
[im 18/36]
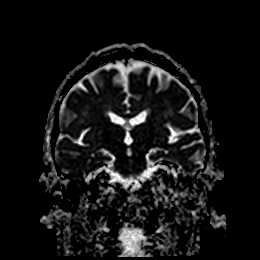
[im 36/36]
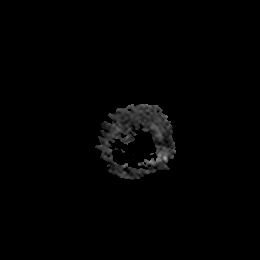

[Series 9: T1 · sagittal · 5.0mm · 0.75mm/px · 2 of 25 slices shown]
[im 1/25]
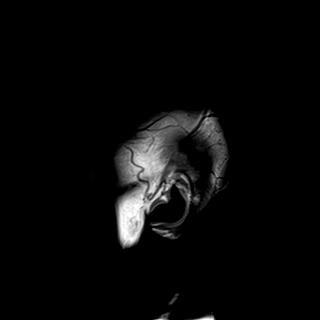
[im 25/25]
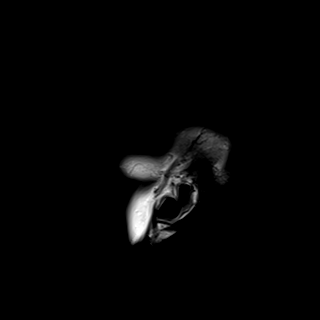

[Series 10: T2 · axial · 5.0mm · 0.72mm/px · z∈[-72,+71]mm · 2 of 25 slices shown (1 of 2)]
[im 1/25]
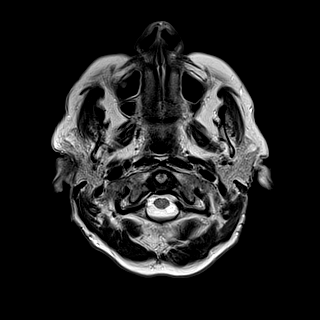
[im 25/25]
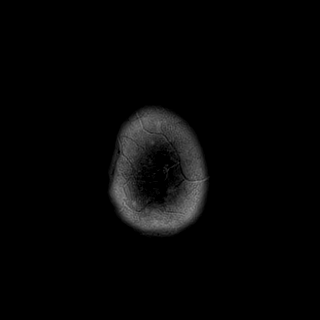

[Series 11: FLAIR · axial · 5.0mm · 0.45mm/px · z∈[-71,+73]mm · 2 of 25 slices shown]
[im 1/25]
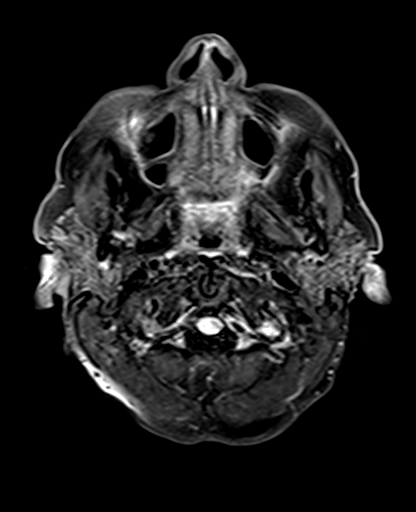
[im 25/25]
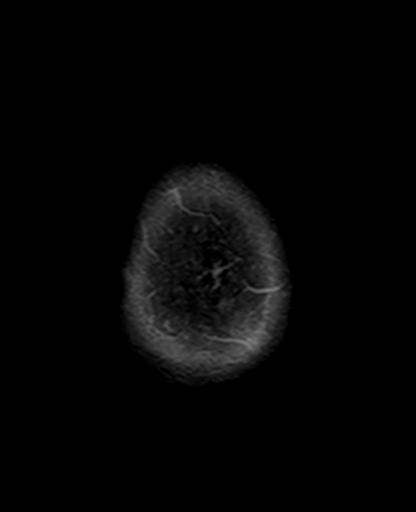

[Series 12: mag_images · axial · 3.0mm · 0.90mm/px · z∈[-87,+89]mm · 4 of 60 slices shown]
[im 1/60]
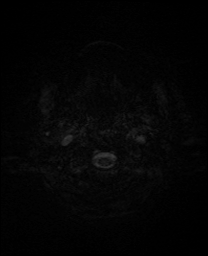
[im 20/60]
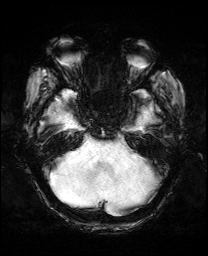
[im 40/60]
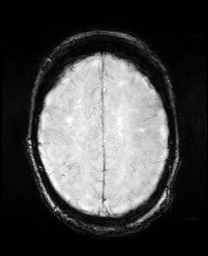
[im 60/60]
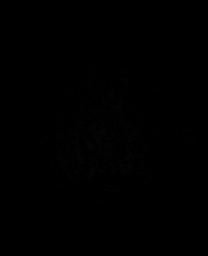

[Series 13: pha_images · axial · 3.0mm · 0.90mm/px · z∈[-87,+89]mm · 4 of 59 slices shown]
[im 1/59]
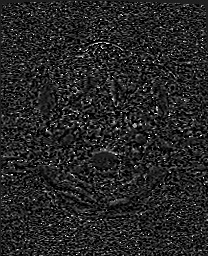
[im 20/59]
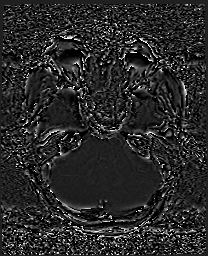
[im 39/59]
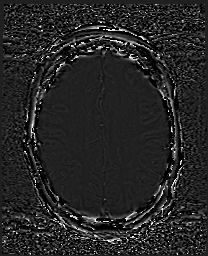
[im 59/59]
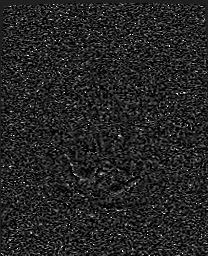

[Series 14: swi_images · axial · 3.0mm · 0.90mm/px · z∈[-87,+89]mm · 4 of 60 slices shown]
[im 1/60]
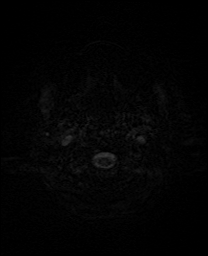
[im 20/60]
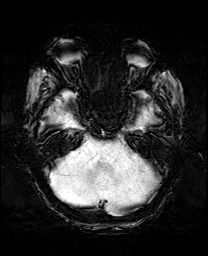
[im 40/60]
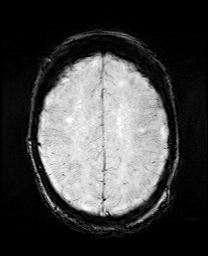
[im 60/60]
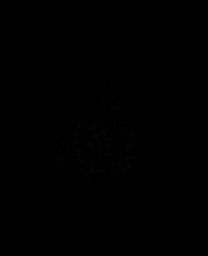

[Series 15: mip_images(sw) · axial · 24.0mm · 0.90mm/px · z∈[-77,+79]mm · 4 of 53 slices shown]
[im 1/53]
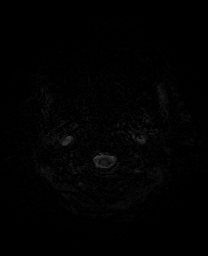
[im 18/53]
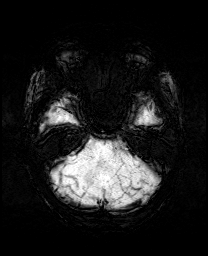
[im 35/53]
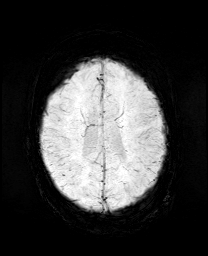
[im 53/53]
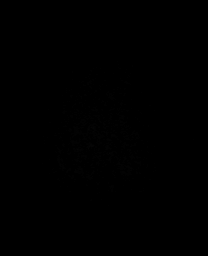

[Series 17: T2 · coronal · 5.0mm · 0.34mm/px · 2 of 30 slices shown (2 of 2)]
[im 1/30]
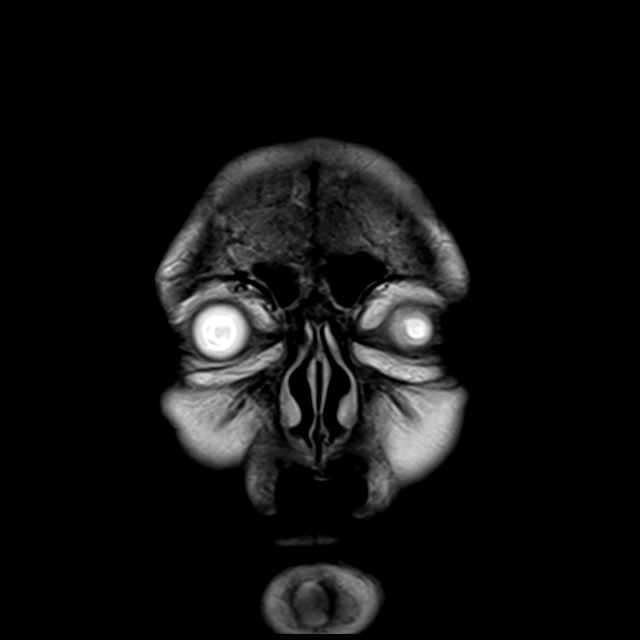
[im 30/30]
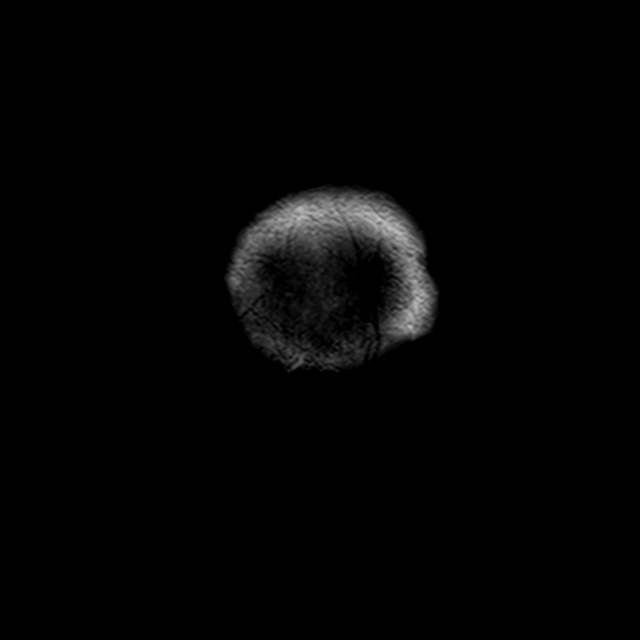

[44 of 48 positions shown; findings below may reference images not displayed]

FINDINGS: MRI HEAD FINDINGS

Brain:

Cerebral volume is normal for age.

There is a thin acute subdural hematoma overlying the anterolateral
right frontal lobe measuring up to 4 mm in greatest thickness (for
instance as seen on series 11, image 13). In retrospect, this is
appreciable on the head CT performed earlier the same day. No
significant mass effect upon the underlying right cerebral
hemisphere. No midline shift.

Moderate multifocal T2/FLAIR hyperintensity within the cerebral
white matter and pons is nonspecific, but compatible with chronic
small vessel ischemic disease.

Unchanged 5 mm rounded extra-axial calcified focus overlying the
anterior left frontal lobe, suspected small incidental meningioma.

There is no acute infarct.

No extra-axial fluid collection.

Vascular: Reported below.

Skull and upper cervical spine: No focal marrow lesion. Cervical
spondylosis. Ligamentous hypertrophy/pannus formation posterior to
the dens.

Sinuses/Orbits: Prior lens replacements. Trace ethmoid sinus mucosal
thickening.

Other: Left parietooccipital scalp soft tissue swelling.

MRA HEAD FINDINGS

The intracranial internal carotid arteries are patent.
Moderate/severe atherosclerotic narrowing of the cavernous segments
bilaterally, progressed as compared to the MRA of 09/26/2009. No M2
proximal branch occlusion. Atherosclerotic irregularity of the M2
and more distal MCA branch vessels bilaterally. Most notably, there
is a moderate stenosis within a proximal M2 left MCA branch (series
2282, image 4). The anterior cerebral arteries are patent.

The intracranial vertebral arteries are patent. The basilar artery
is patent. The posterior cerebral arteries are patent. Mild
atherosclerotic narrowing of the proximal left PCA (series 8530,
image 15). Mild/moderate narrowing of the left PCA at the P3/P4
junction. Posterior communicating arteries are present bilaterally.

These results were called by telephone at the time of interpretation
on 02/08/2020 at [DATE] to provider Dr. Shalley, who verbally
acknowledged these results.
IMPRESSION: MRI brain:

1. Thin acute subdural hematoma overlying the anterolateral right
frontal lobe, measuring up to 4 mm in thickness.
2. Moderate chronic small vessel ischemic disease, stable as
compared to the brain MRI of 11/06/2019.
[DATE]. Suspected 5 mm incidental meningioma overlying the anterior left
frontal lobe, unchanged.
4. Left parietooccipital scalp soft tissue swelling.

MRA head:

1. No intracranial large vessel occlusion.
2. Intracranial atherosclerotic disease most notably as follows.
3. Moderate/severe stenosis of the cavernous internal carotid
arteries bilaterally, progressed as compared to the MRA of
09/26/2009.
4. Moderate focal stenosis within a proximal M2 left MCA branch
vessel.
5. Mild/moderate stenosis of the left PCA at the P3/P4 junction.

## 2021-08-05 DIAGNOSIS — I82432 Acute embolism and thrombosis of left popliteal vein: Secondary | ICD-10-CM | POA: Diagnosis not present

## 2021-08-05 DIAGNOSIS — E1122 Type 2 diabetes mellitus with diabetic chronic kidney disease: Secondary | ICD-10-CM | POA: Diagnosis not present

## 2021-08-05 DIAGNOSIS — M14671 Charcot's joint, right ankle and foot: Secondary | ICD-10-CM | POA: Diagnosis not present

## 2021-08-05 DIAGNOSIS — I509 Heart failure, unspecified: Secondary | ICD-10-CM | POA: Diagnosis not present

## 2021-08-05 DIAGNOSIS — I130 Hypertensive heart and chronic kidney disease with heart failure and stage 1 through stage 4 chronic kidney disease, or unspecified chronic kidney disease: Secondary | ICD-10-CM | POA: Diagnosis not present

## 2021-08-05 DIAGNOSIS — N1831 Chronic kidney disease, stage 3a: Secondary | ICD-10-CM | POA: Diagnosis not present

## 2021-08-05 IMAGING — DX DG PORTABLE PELVIS
1 series · 1 of 1 positions shown · non-contrast
Comparison: February 08, 2020

CLINICAL DATA: LEFT hip arthroplasty

EXAM:
PORTABLE PELVIS 1-2 VIEWS

[pelvis ap]
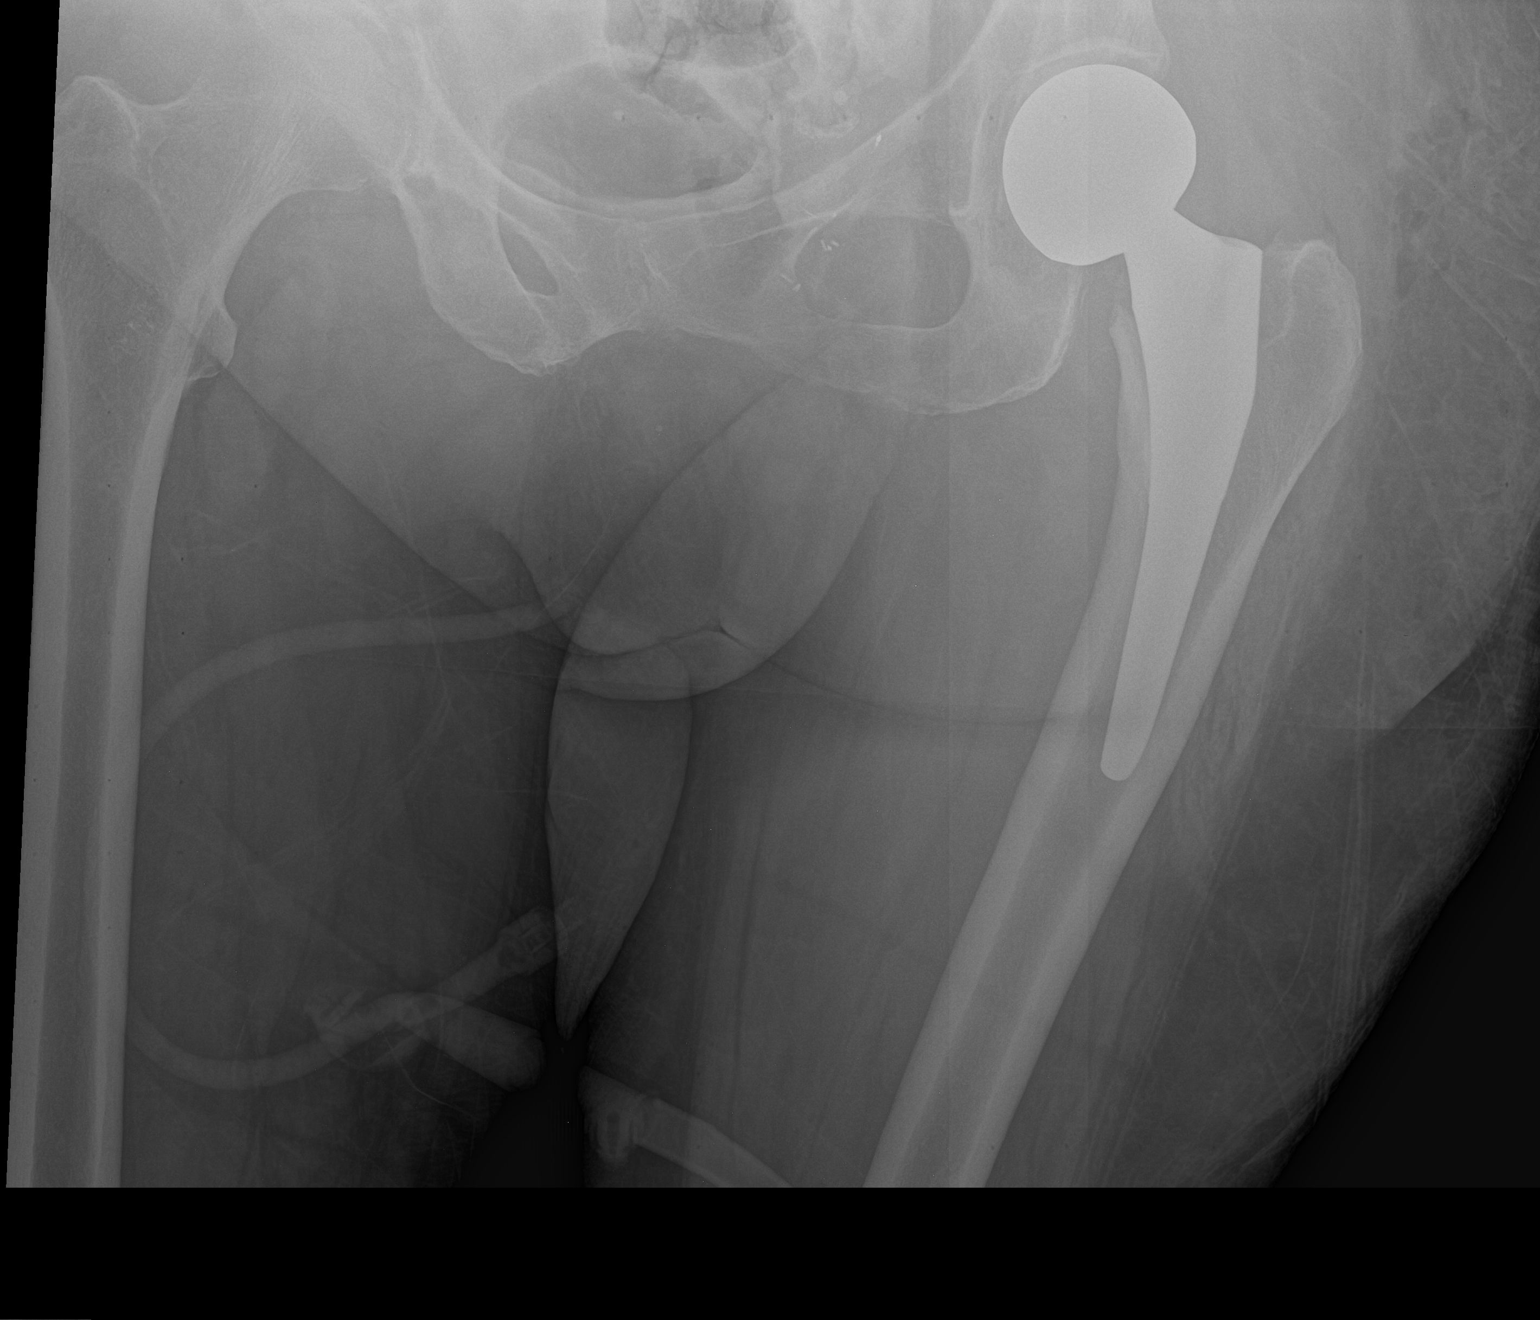

[1 of 1 positions shown; findings below may reference images not displayed]

FINDINGS: Status post LEFT hip arthroplasty. Orthopedic hardware is intact and
without periprosthetic fracture or lucency. Osteopenia. No
additional acute fracture or dislocation noted on limited views.
LEFT-sided subcutaneous air. Foley catheter. Surgical clips project
over the LEFT groin.
IMPRESSION: Expected postsurgical changes status post LEFT hip arthroplasty.

## 2021-08-07 IMAGING — DX DG CHEST 1V
1 series · 1 of 1 positions shown · non-contrast
Comparison: Radiograph 02/08/2020

CLINICAL DATA: Dyspnea

EXAM:
CHEST  1 VIEW

[chest ap]
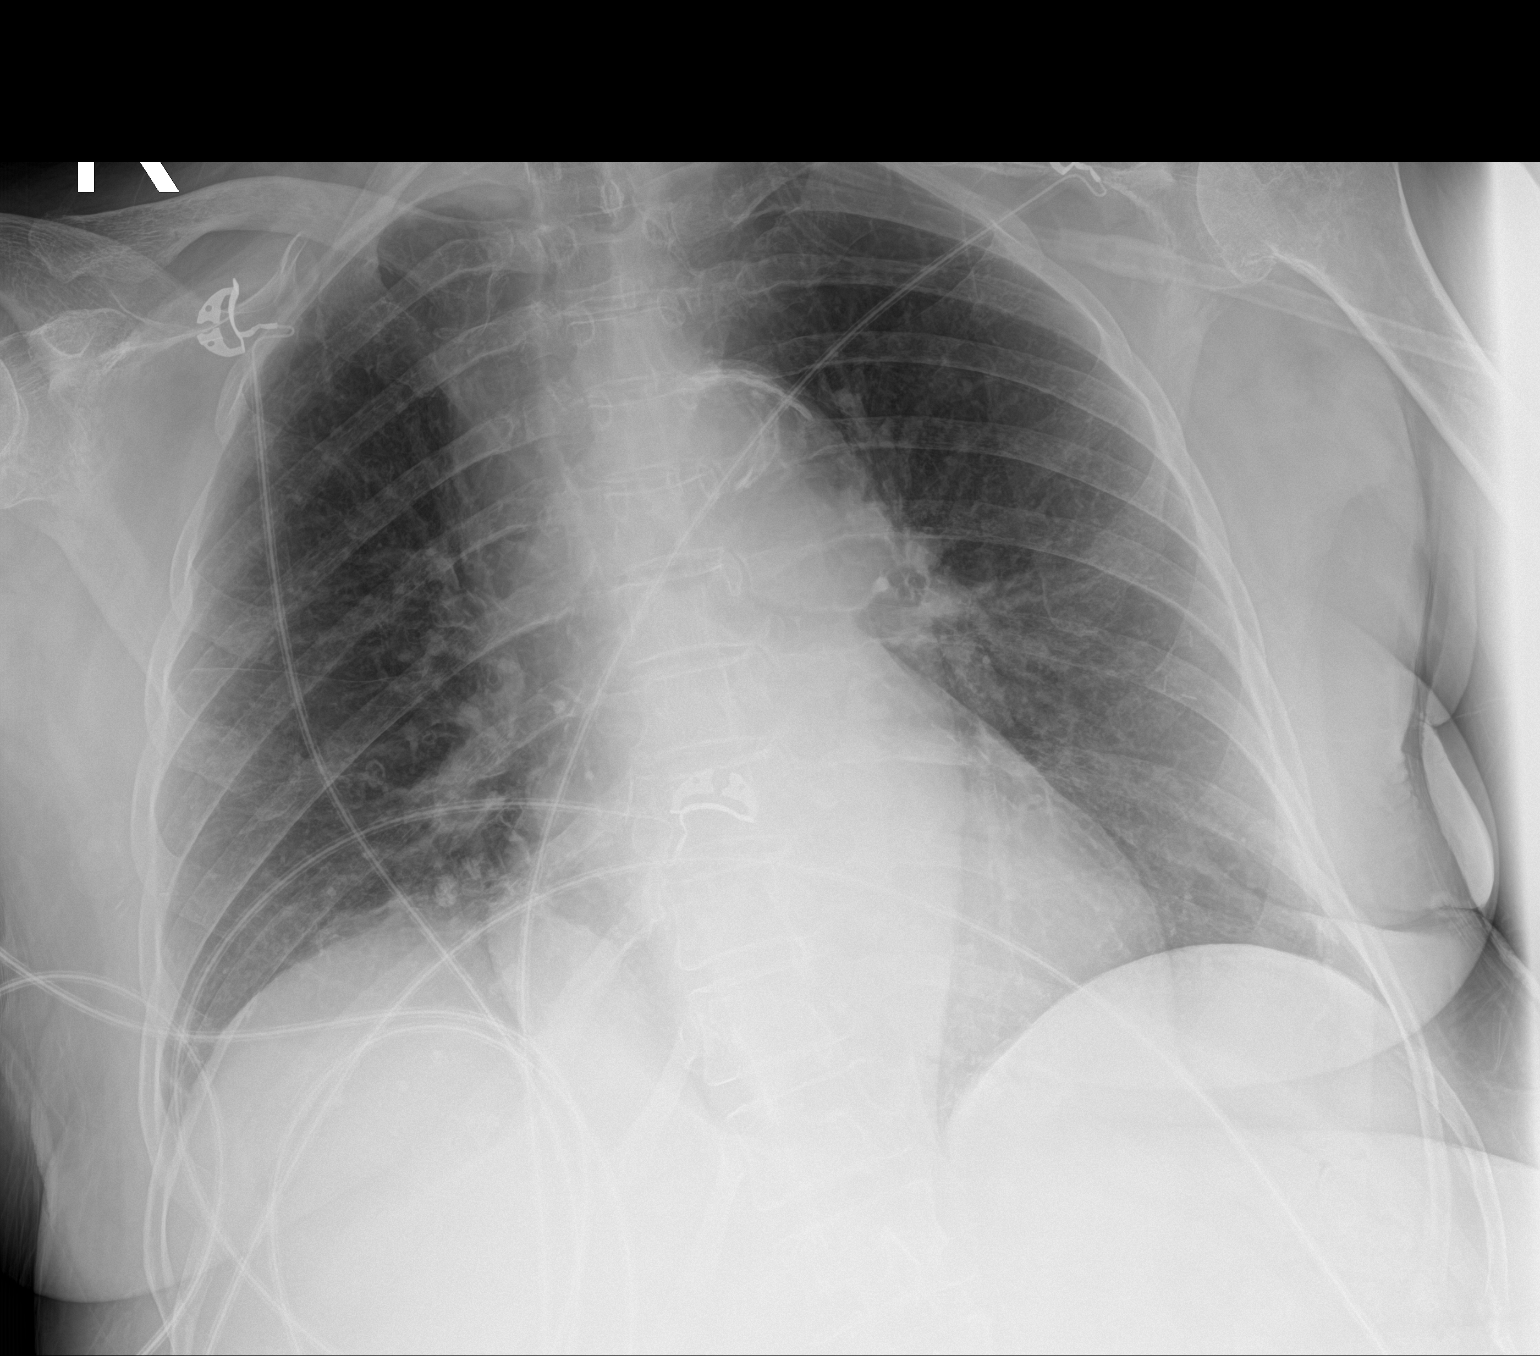

[1 of 1 positions shown; findings below may reference images not displayed]

FINDINGS: Chronic elevation of the right hemidiaphragm with some adjacent
atelectatic changes. There is some increasing vascular congestion
with hazy interstitial opacity and fissural thickening. No focal
consolidative opacity is seen. Stable cardiomediastinal contours
with a calcified, tortuous aorta. Telemetry leads and support
devices overlie the chest. Degenerative changes are present in the
imaged spine and shoulders.
IMPRESSION: 1. Increasing vascular congestion and features which may suggest
early interstitial edema.
2. Chronic elevation of the right hemidiaphragm with adjacent
atelectatic changes.
3.  Aortic Atherosclerosis ([id number]).

## 2021-08-11 IMAGING — DX DG ABDOMEN 1V
2 series · 2 of 2 positions shown · non-contrast
Comparison: None.

CLINICAL DATA: Nausea, abdominal pain

EXAM:
ABDOMEN - 1 VIEW

[abdomen kub (1 of 2)]
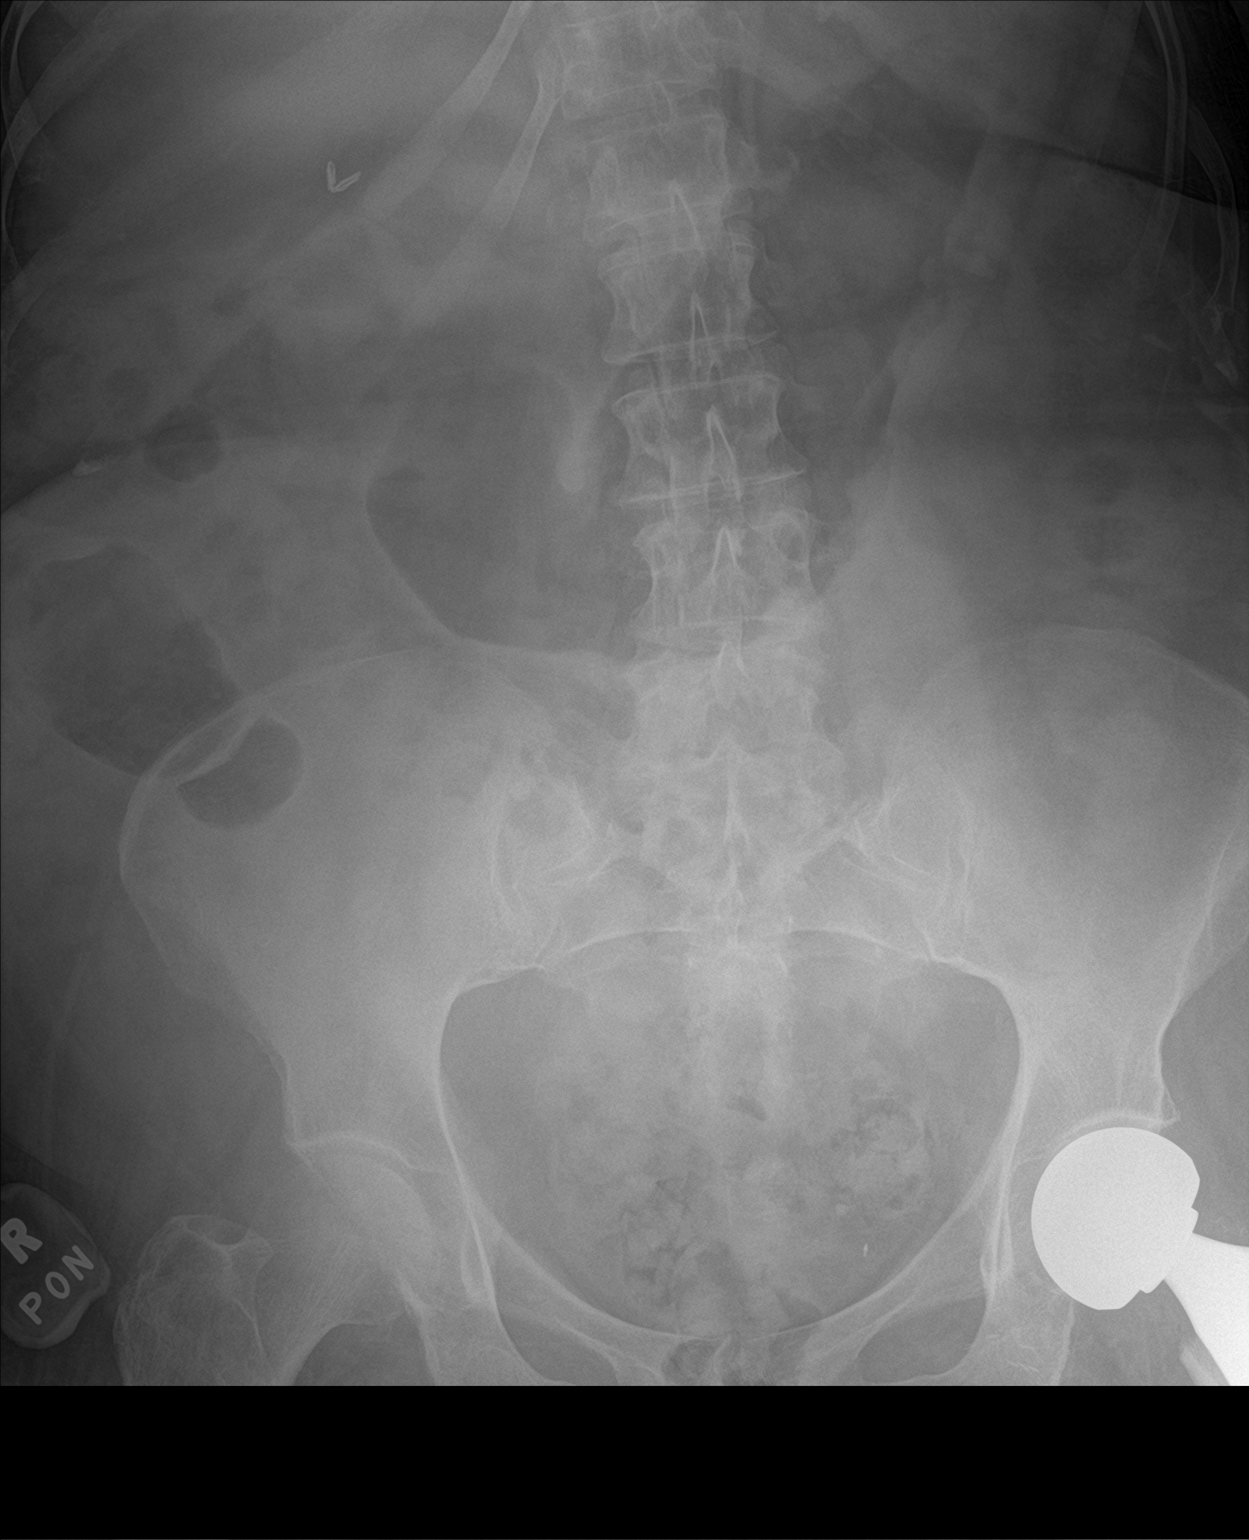

[abdomen kub (2 of 2)]
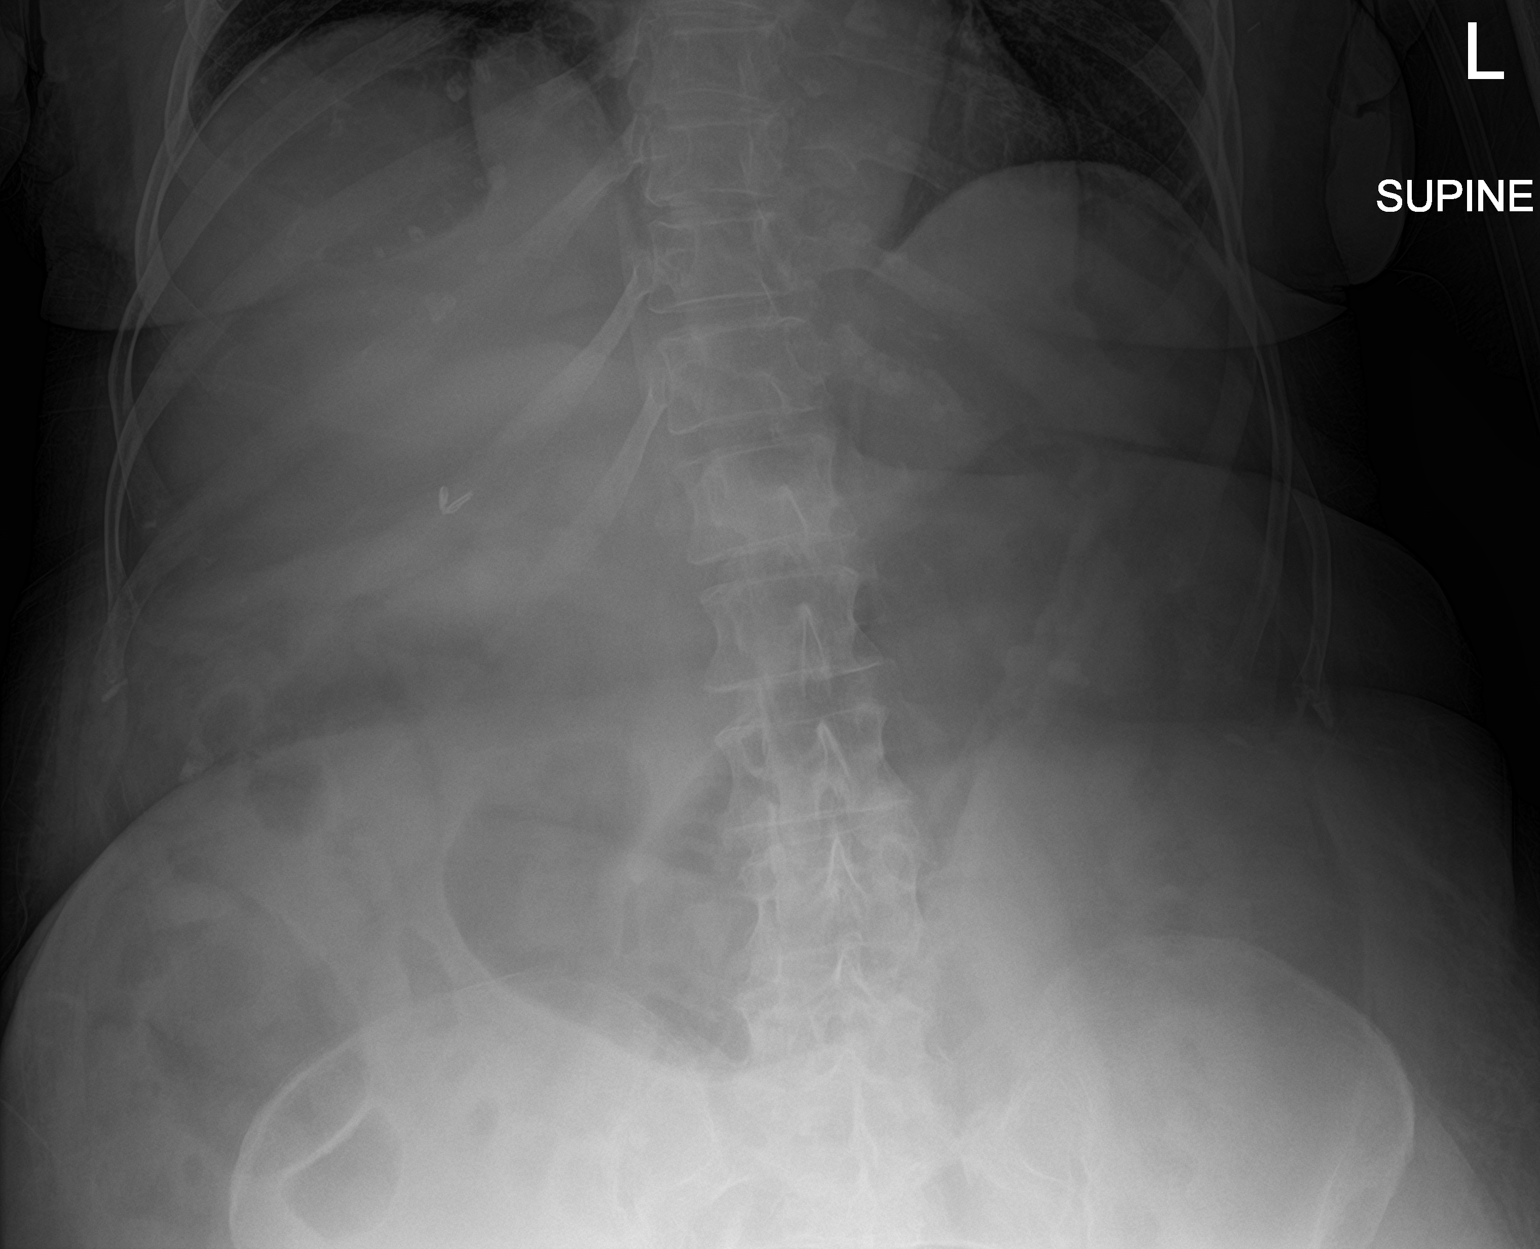

[2 of 2 positions shown; findings below may reference images not displayed]

FINDINGS: The stomach is gas filled. Nonobstructive pattern of bowel gas with
scattered gas and stool present to the rectum. No free air in the
abdomen on supine radiographs. Status post left hip total
arthroplasty. No radio-opaque calculi or other significant
radiographic abnormality are seen.
IMPRESSION: The stomach is gas filled. Nonobstructive pattern of bowel gas with
scattered gas and stool present to the rectum. No free air in the
abdomen on supine radiographs.

## 2021-08-12 DIAGNOSIS — I509 Heart failure, unspecified: Secondary | ICD-10-CM | POA: Diagnosis not present

## 2021-08-12 DIAGNOSIS — I130 Hypertensive heart and chronic kidney disease with heart failure and stage 1 through stage 4 chronic kidney disease, or unspecified chronic kidney disease: Secondary | ICD-10-CM | POA: Diagnosis not present

## 2021-08-12 DIAGNOSIS — E1122 Type 2 diabetes mellitus with diabetic chronic kidney disease: Secondary | ICD-10-CM | POA: Diagnosis not present

## 2021-08-12 DIAGNOSIS — M14671 Charcot's joint, right ankle and foot: Secondary | ICD-10-CM | POA: Diagnosis not present

## 2021-08-12 DIAGNOSIS — I82432 Acute embolism and thrombosis of left popliteal vein: Secondary | ICD-10-CM | POA: Diagnosis not present

## 2021-08-12 DIAGNOSIS — N1831 Chronic kidney disease, stage 3a: Secondary | ICD-10-CM | POA: Diagnosis not present

## 2021-08-12 IMAGING — DX DG CHEST 1V PORT
1 series · 1 of 1 positions shown · non-contrast
Comparison: None.

CLINICAL DATA: Respiratory distress.  Hypertension.

EXAM:
PORTABLE CHEST 1 VIEW

[chest ap]
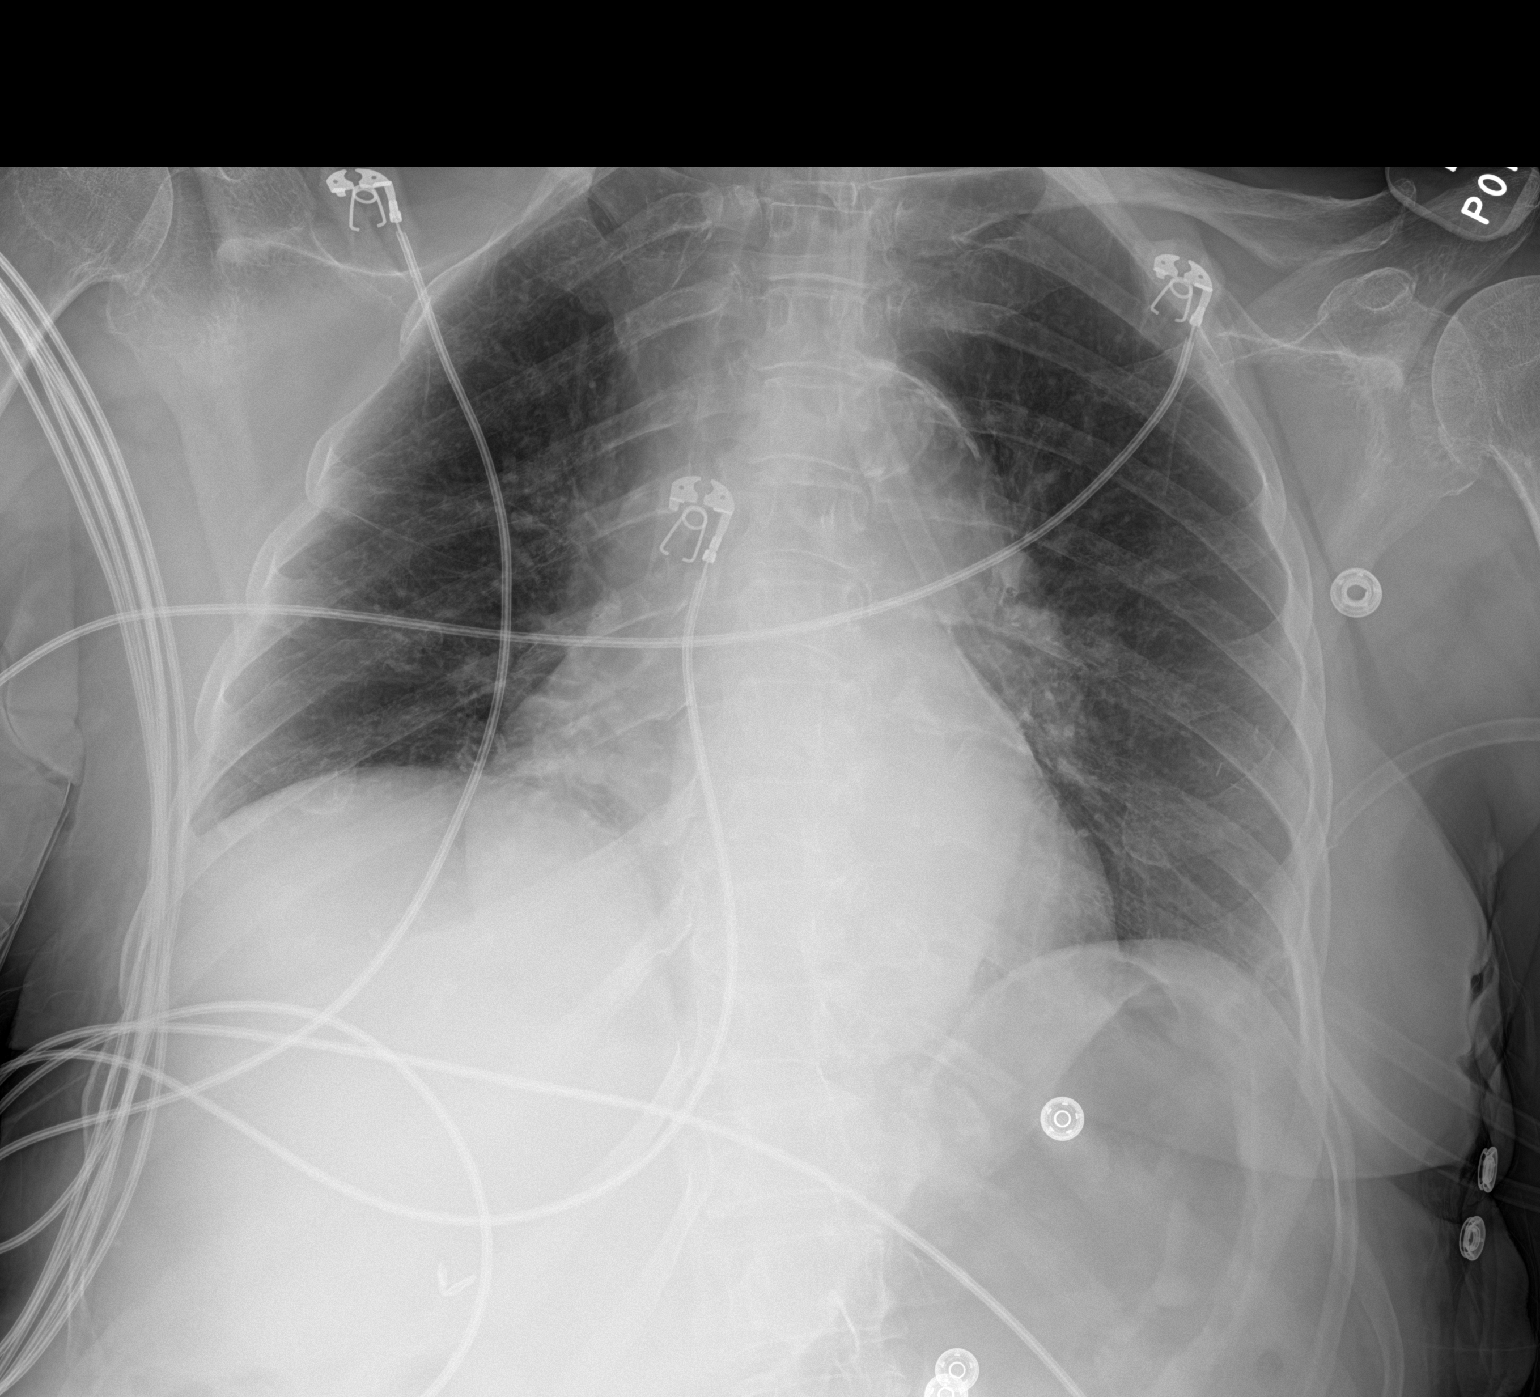

[1 of 1 positions shown; findings below may reference images not displayed]

FINDINGS: Stable cardiomegaly. The hila and mediastinum are unchanged. The
lungs are clear. No other acute abnormalities.
IMPRESSION: Stable cardiomegaly. No focal infiltrates or pulmonary abnormalities
identified.

## 2021-08-12 IMAGING — CT CT ABD-PELV W/O CM
2 of 4 series · 15 of 46 positions shown, 17 images · non-contrast
Comparison: CT dated 11/16/2015

CLINICAL DATA: Nausea and vomiting with drop in hemoglobin. Recent
hip surgery.

EXAM:
CT ABDOMEN AND PELVIS WITHOUT CONTRAST
TECHNIQUE: Multidetector CT imaging of the abdomen and pelvis was performed
following the standard protocol without IV contrast.

[Series 3: a/p w/o 5mm · axial · non-contrast · 0.91mm/px · z∈[+946,+1396]mm · 12 of 104 slices shown, 14 images]
[im 9/104  soft-tissue]
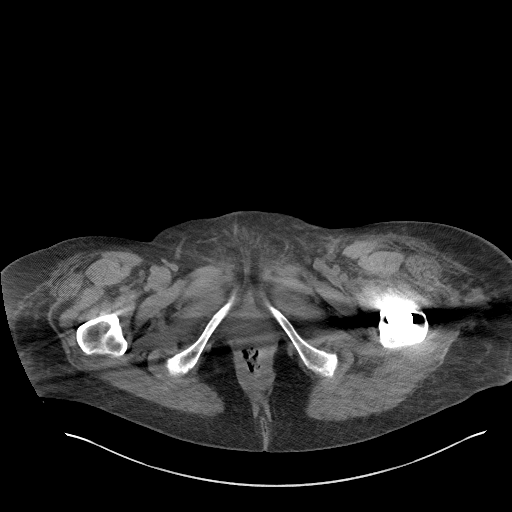
[im 9/104  bone]
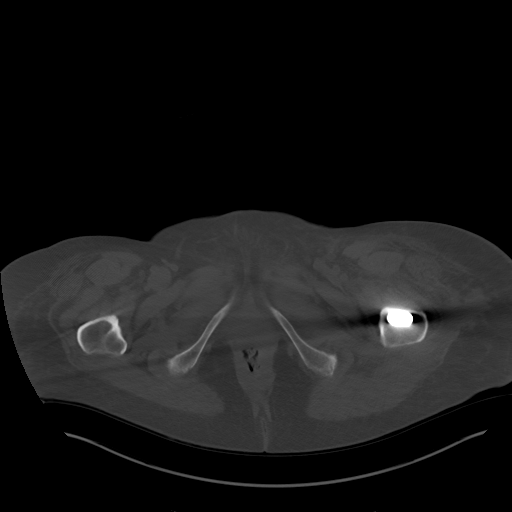
[im 17/104  soft-tissue]
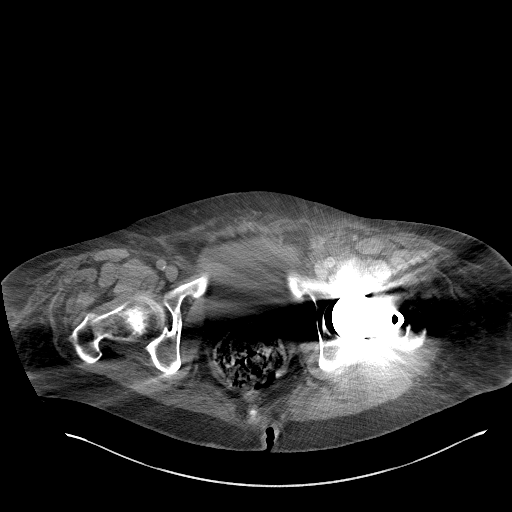
[im 25/104  soft-tissue]
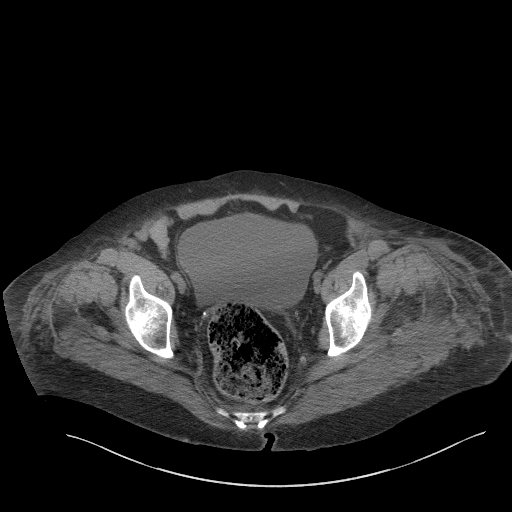
[im 33/104  soft-tissue]
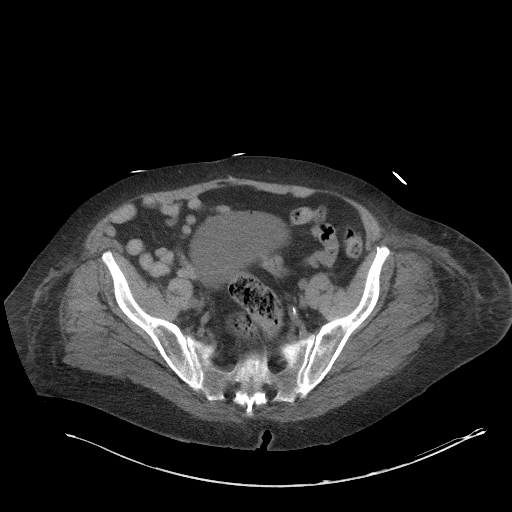
[im 42/104  soft-tissue]
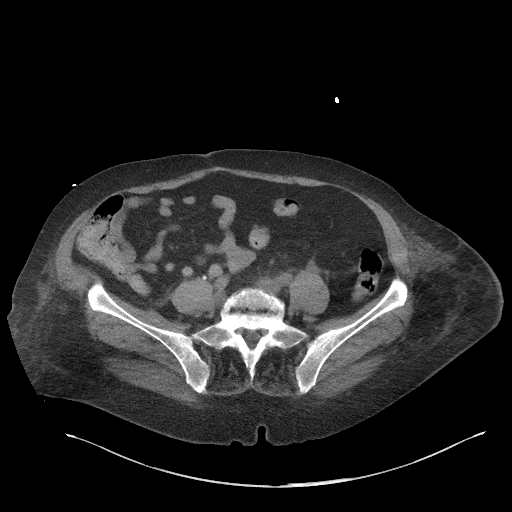
[im 50/104  soft-tissue]
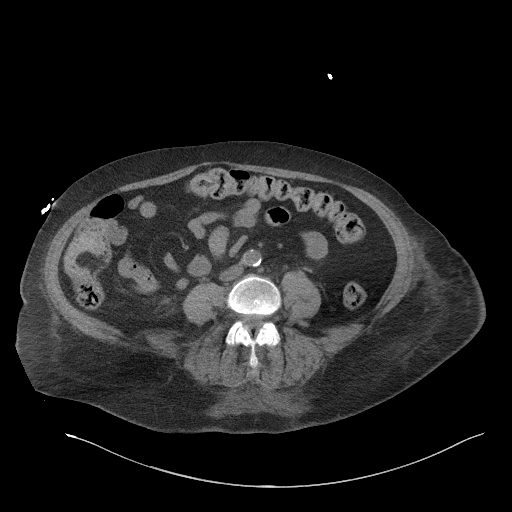
[im 58/104  soft-tissue]
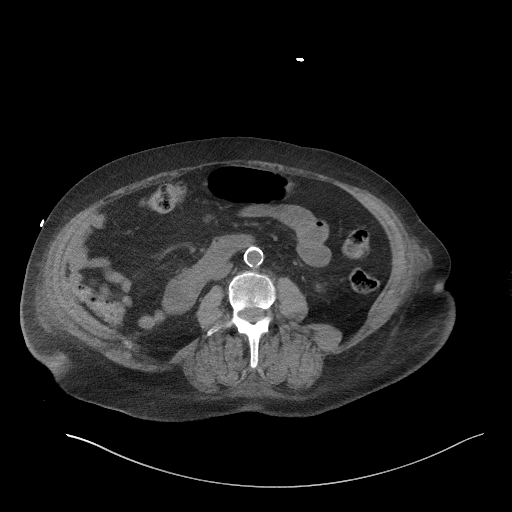
[im 66/104  soft-tissue]
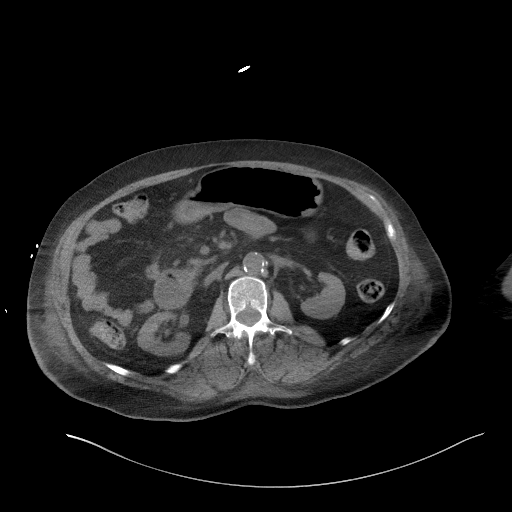
[im 75/104  soft-tissue]
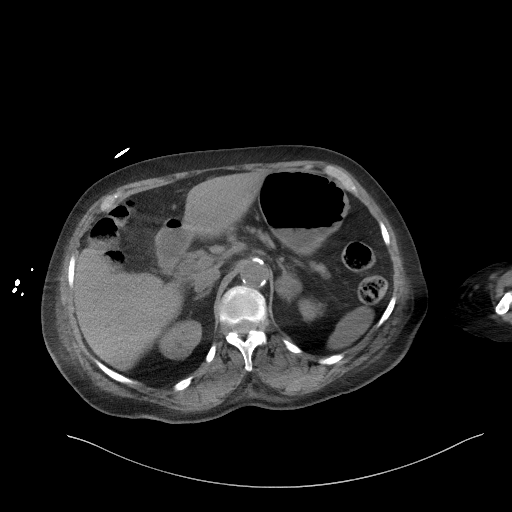
[im 75/104  bone]
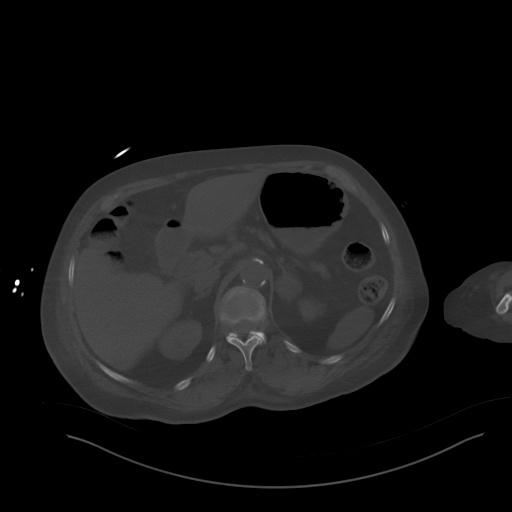
[im 83/104  soft-tissue]
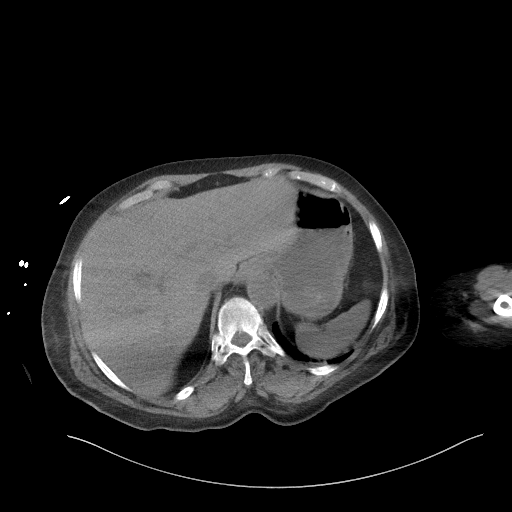
[im 91/104  soft-tissue]
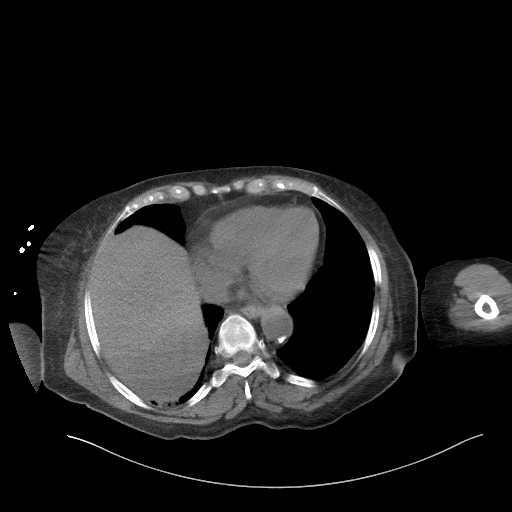
[im 99/104  soft-tissue]
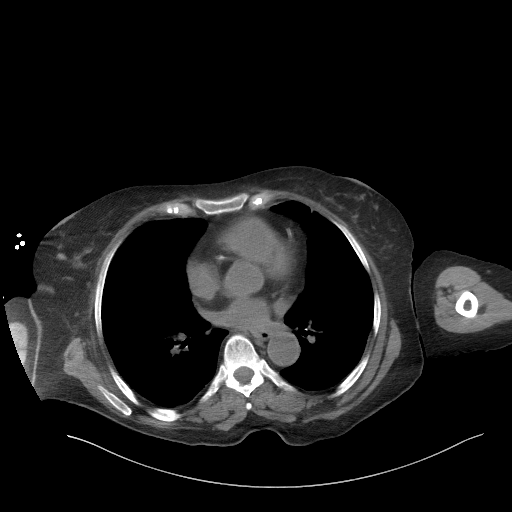

[Series 6: a/p w/o cor · coronal · non-contrast · 0.88mm/px · 3 of 138 slices shown]
[im 46/138  soft-tissue]
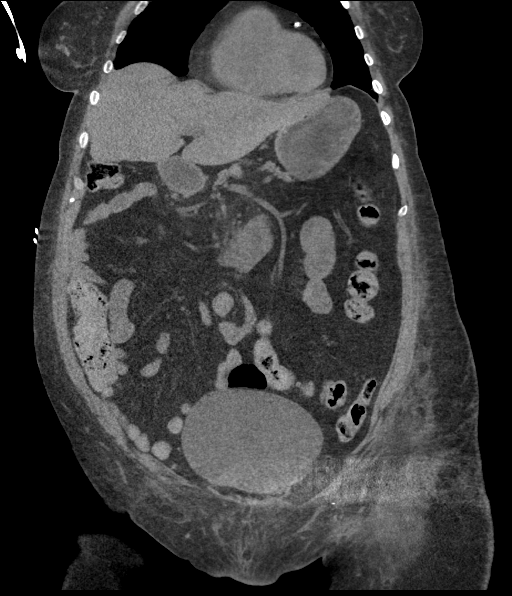
[im 61/138  soft-tissue]
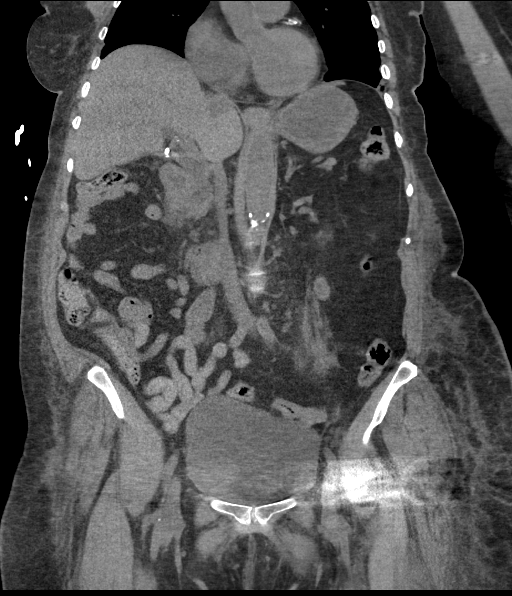
[im 77/138  soft-tissue]
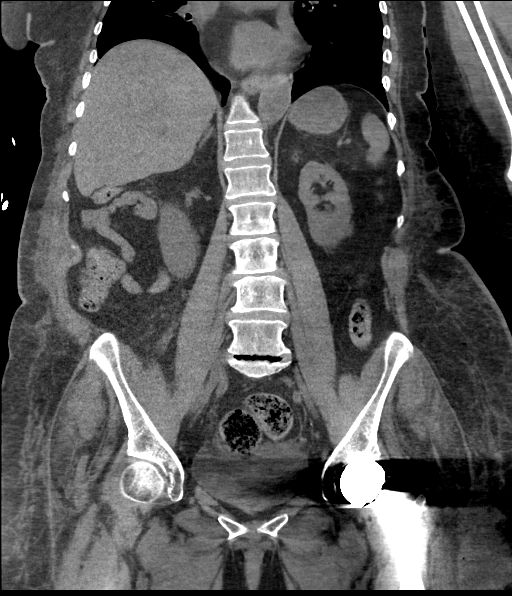

[15 of 46 positions shown; findings below may reference images not displayed]

FINDINGS: Lower chest: There is a partially visualized pulmonary nodule in the
left lower lobe measuring approximately 5 mm (axial series 4, image
1).The intracardiac blood pool is hypodense relative to the adjacent
myocardium consistent with anemia. The heart size is normal. There
are coronary artery calcifications.

Hepatobiliary: The liver is normal. Status post
cholecystectomy.There is no biliary ductal dilation.

Pancreas: Normal contours without ductal dilatation. No
peripancreatic fluid collection.

Spleen: Unremarkable.

Adrenals/Urinary Tract:

--Adrenal glands: There is a stable left adrenal mass, consistent
with a benign adrenal adenoma.

--Right kidney/ureter: No hydronephrosis or radiopaque kidney
stones.

--Left kidney/ureter: No hydronephrosis or radiopaque kidney stones.

--Urinary bladder: There is a tiny focus of gas within the urinary
bladder, likely representing sequela of recent instrumentation.

Stomach/Bowel:

--Stomach/Duodenum: No hiatal hernia or other gastric abnormality.
Normal duodenal course and caliber.

--Small bowel: Unremarkable.

--Colon: There is an above average amount of stool in the colon.
There are few scattered colonic diverticula without CT evidence for
diverticulitis.

--Appendix: Normal.

Vascular/Lymphatic: Atherosclerotic calcification is present within
the non-aneurysmal abdominal aorta, without hemodynamically
significant stenosis.

--No retroperitoneal lymphadenopathy.

--No mesenteric lymphadenopathy.

--No pelvic or inguinal lymphadenopathy.

Reproductive: The pelvis is suboptimally evaluated secondary to
streak artifact. However, the patient appears to be status post
prior hysterectomy.

Other: No ascites or free air. The abdominal wall is normal.

Musculoskeletal. There are expected postsurgical changes related to
recent total hip arthroplasty on the left. There is no large
adjacent hematoma. The hardware appears grossly intact where
visualized.
IMPRESSION: 1. No acute intra-abdominal abnormality detected. No specific
abnormality detected to explain the patient's anemia.
2. Expected postsurgical changes related to recent left total hip
arthroplasty.
3. There is a partially visualized 5 mm pulmonary nodule in the left
lower lobe. In a low risk patient, no further follow-up is required.
No follow-up needed if patient is low-risk. Non-contrast chest CT
can be considered in 12 months if patient is high-risk. This
recommendation follows the consensus statement: Guidelines for
Management of Incidental Pulmonary Nodules Detected on CT Images:
4. Anemia.

Aortic Atherosclerosis (0455B-2XJ.J).

## 2021-08-13 DIAGNOSIS — I130 Hypertensive heart and chronic kidney disease with heart failure and stage 1 through stage 4 chronic kidney disease, or unspecified chronic kidney disease: Secondary | ICD-10-CM | POA: Diagnosis not present

## 2021-08-13 DIAGNOSIS — I509 Heart failure, unspecified: Secondary | ICD-10-CM | POA: Diagnosis not present

## 2021-08-13 DIAGNOSIS — M14671 Charcot's joint, right ankle and foot: Secondary | ICD-10-CM | POA: Diagnosis not present

## 2021-08-13 DIAGNOSIS — I82432 Acute embolism and thrombosis of left popliteal vein: Secondary | ICD-10-CM | POA: Diagnosis not present

## 2021-08-13 DIAGNOSIS — E1122 Type 2 diabetes mellitus with diabetic chronic kidney disease: Secondary | ICD-10-CM | POA: Diagnosis not present

## 2021-08-13 DIAGNOSIS — N1831 Chronic kidney disease, stage 3a: Secondary | ICD-10-CM | POA: Diagnosis not present

## 2021-08-13 IMAGING — DX DG CHEST 1V PORT
1 series · 1 of 1 positions shown · non-contrast
Comparison: Chest x-ray dated 02/17/2020

CLINICAL DATA: Central line placement

EXAM:
PORTABLE CHEST 1 VIEW

[chest ap]
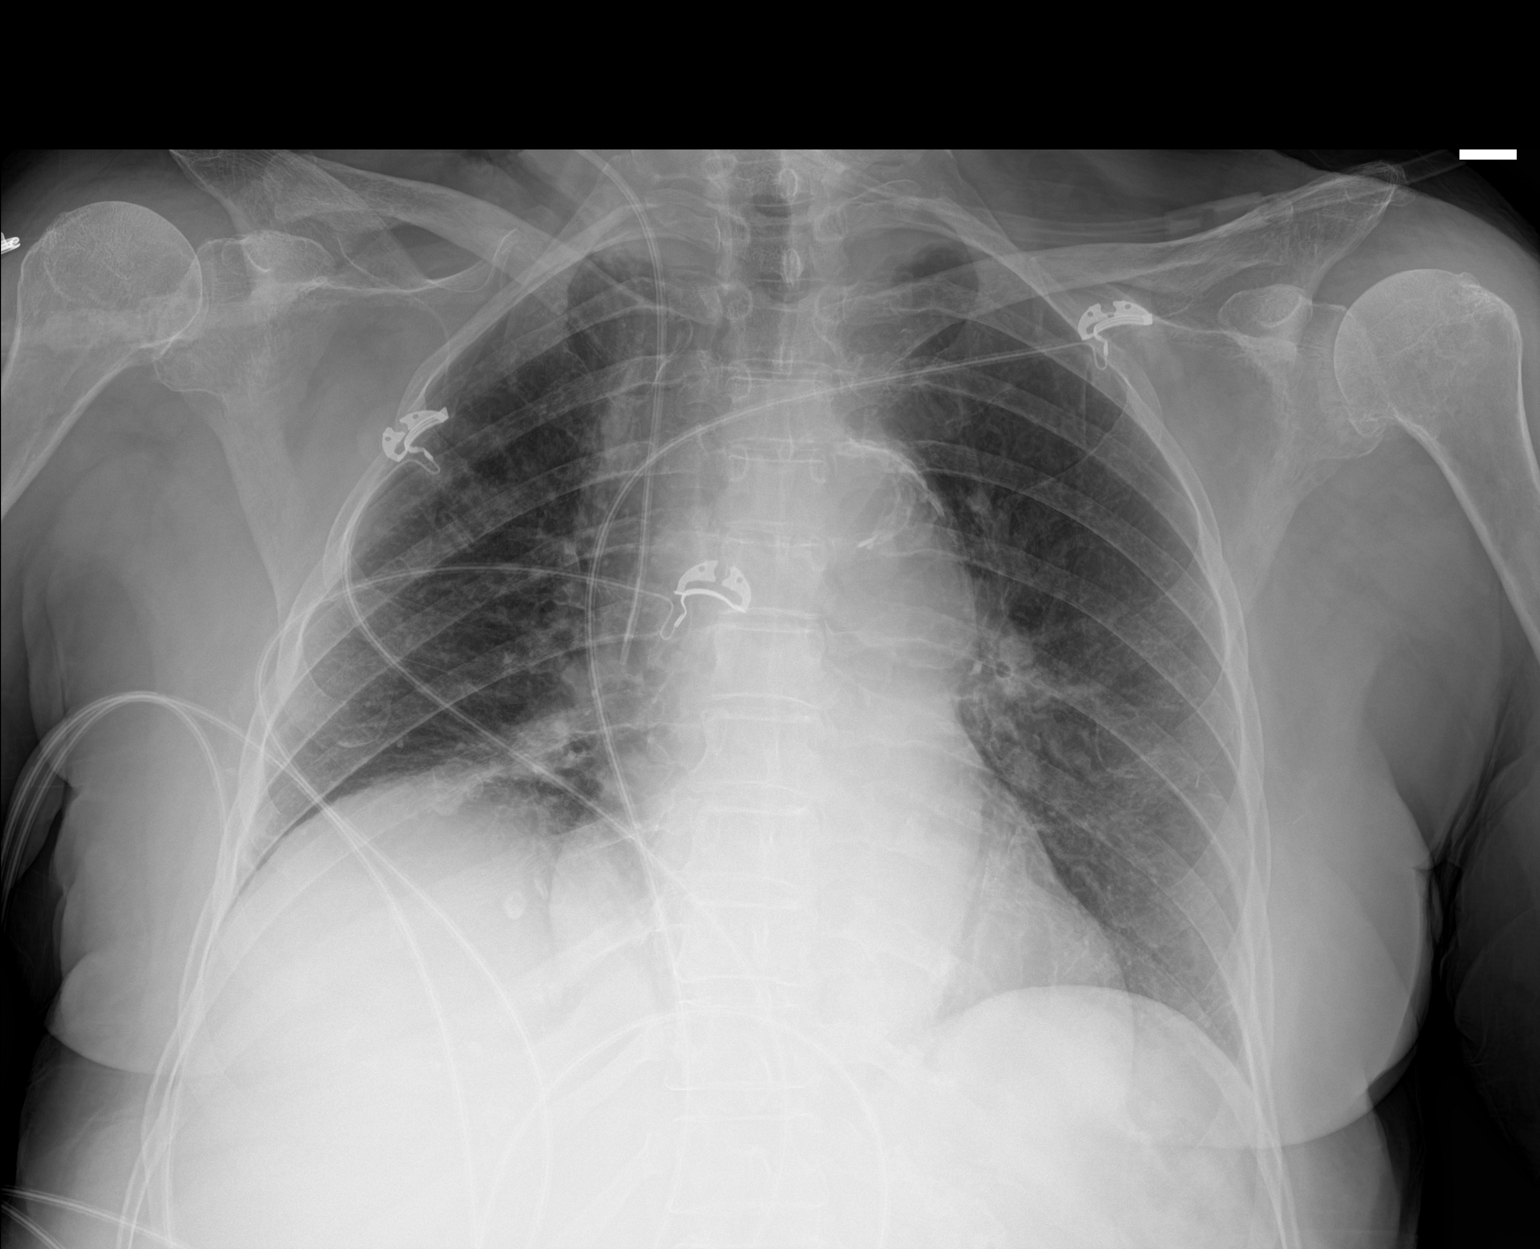

[1 of 1 positions shown; findings below may reference images not displayed]

FINDINGS: There is a newly placed right-sided central venous catheter with the
tip terminating over the SVC. There is no right-sided pneumothorax.
The heart size is similar to prior study. Aortic calcifications are
noted. There are developing bibasilar airspace opacities which are
favored to represent atelectasis. There is elevation of the right
hemidiaphragm. There are degenerative changes of both shoulders.
There is no acute displaced fracture.
IMPRESSION: 1. Newly placed right-sided central venous catheter as above with no
evidence for pneumothorax.
2. Developing bibasilar atelectasis.

## 2021-08-14 DIAGNOSIS — I130 Hypertensive heart and chronic kidney disease with heart failure and stage 1 through stage 4 chronic kidney disease, or unspecified chronic kidney disease: Secondary | ICD-10-CM | POA: Diagnosis not present

## 2021-08-14 DIAGNOSIS — E663 Overweight: Secondary | ICD-10-CM | POA: Diagnosis not present

## 2021-08-14 DIAGNOSIS — I700 Atherosclerosis of aorta: Secondary | ICD-10-CM | POA: Diagnosis not present

## 2021-08-14 DIAGNOSIS — I771 Stricture of artery: Secondary | ICD-10-CM | POA: Diagnosis not present

## 2021-08-14 DIAGNOSIS — Z79899 Other long term (current) drug therapy: Secondary | ICD-10-CM | POA: Diagnosis not present

## 2021-08-14 DIAGNOSIS — I5032 Chronic diastolic (congestive) heart failure: Secondary | ICD-10-CM | POA: Diagnosis not present

## 2021-08-14 DIAGNOSIS — N1832 Chronic kidney disease, stage 3b: Secondary | ICD-10-CM | POA: Diagnosis not present

## 2021-08-14 DIAGNOSIS — E1122 Type 2 diabetes mellitus with diabetic chronic kidney disease: Secondary | ICD-10-CM | POA: Diagnosis not present

## 2021-08-14 DIAGNOSIS — M109 Gout, unspecified: Secondary | ICD-10-CM | POA: Diagnosis not present

## 2021-08-14 DIAGNOSIS — D6859 Other primary thrombophilia: Secondary | ICD-10-CM | POA: Diagnosis not present

## 2021-08-14 DIAGNOSIS — Z6825 Body mass index (BMI) 25.0-25.9, adult: Secondary | ICD-10-CM | POA: Diagnosis not present

## 2021-08-14 DIAGNOSIS — E11610 Type 2 diabetes mellitus with diabetic neuropathic arthropathy: Secondary | ICD-10-CM | POA: Diagnosis not present

## 2021-08-16 ENCOUNTER — Emergency Department (HOSPITAL_COMMUNITY): Payer: Medicare Other

## 2021-08-16 ENCOUNTER — Other Ambulatory Visit: Payer: Self-pay

## 2021-08-16 ENCOUNTER — Encounter (HOSPITAL_COMMUNITY): Payer: Self-pay | Admitting: Internal Medicine

## 2021-08-16 ENCOUNTER — Inpatient Hospital Stay (HOSPITAL_COMMUNITY)
Admission: EM | Admit: 2021-08-16 | Discharge: 2021-08-20 | DRG: 640 | Disposition: A | Discharge status: Home Health | Payer: Medicare Other | Attending: Internal Medicine | Admitting: Internal Medicine

## 2021-08-16 DIAGNOSIS — Z7901 Long term (current) use of anticoagulants: Secondary | ICD-10-CM

## 2021-08-16 DIAGNOSIS — E873 Alkalosis: Secondary | ICD-10-CM | POA: Diagnosis present

## 2021-08-16 DIAGNOSIS — Z9861 Coronary angioplasty status: Secondary | ICD-10-CM | POA: Diagnosis not present

## 2021-08-16 DIAGNOSIS — B952 Enterococcus as the cause of diseases classified elsewhere: Secondary | ICD-10-CM | POA: Diagnosis present

## 2021-08-16 DIAGNOSIS — G2581 Restless legs syndrome: Secondary | ICD-10-CM | POA: Diagnosis not present

## 2021-08-16 DIAGNOSIS — E785 Hyperlipidemia, unspecified: Secondary | ICD-10-CM | POA: Diagnosis present

## 2021-08-16 DIAGNOSIS — Z96642 Presence of left artificial hip joint: Secondary | ICD-10-CM | POA: Diagnosis present

## 2021-08-16 DIAGNOSIS — F03A4 Unspecified dementia, mild, with anxiety: Secondary | ICD-10-CM | POA: Diagnosis present

## 2021-08-16 DIAGNOSIS — Z91148 Patient's other noncompliance with medication regimen for other reason: Secondary | ICD-10-CM

## 2021-08-16 DIAGNOSIS — Z800 Family history of malignant neoplasm of digestive organs: Secondary | ICD-10-CM

## 2021-08-16 DIAGNOSIS — Z86711 Personal history of pulmonary embolism: Secondary | ICD-10-CM

## 2021-08-16 DIAGNOSIS — F322 Major depressive disorder, single episode, severe without psychotic features: Secondary | ICD-10-CM | POA: Diagnosis not present

## 2021-08-16 DIAGNOSIS — F419 Anxiety disorder, unspecified: Secondary | ICD-10-CM | POA: Diagnosis not present

## 2021-08-16 DIAGNOSIS — E1159 Type 2 diabetes mellitus with other circulatory complications: Secondary | ICD-10-CM | POA: Diagnosis not present

## 2021-08-16 DIAGNOSIS — E11610 Type 2 diabetes mellitus with diabetic neuropathic arthropathy: Secondary | ICD-10-CM | POA: Diagnosis present

## 2021-08-16 DIAGNOSIS — G43819 Other migraine, intractable, without status migrainosus: Secondary | ICD-10-CM

## 2021-08-16 DIAGNOSIS — Z83438 Family history of other disorder of lipoprotein metabolism and other lipidemia: Secondary | ICD-10-CM

## 2021-08-16 DIAGNOSIS — K219 Gastro-esophageal reflux disease without esophagitis: Secondary | ICD-10-CM | POA: Diagnosis not present

## 2021-08-16 DIAGNOSIS — R531 Weakness: Secondary | ICD-10-CM | POA: Diagnosis not present

## 2021-08-16 DIAGNOSIS — Z170 Estrogen receptor positive status [ER+]: Secondary | ICD-10-CM

## 2021-08-16 DIAGNOSIS — N390 Urinary tract infection, site not specified: Secondary | ICD-10-CM | POA: Diagnosis present

## 2021-08-16 DIAGNOSIS — Z853 Personal history of malignant neoplasm of breast: Secondary | ICD-10-CM

## 2021-08-16 DIAGNOSIS — I130 Hypertensive heart and chronic kidney disease with heart failure and stage 1 through stage 4 chronic kidney disease, or unspecified chronic kidney disease: Secondary | ICD-10-CM | POA: Diagnosis present

## 2021-08-16 DIAGNOSIS — E876 Hypokalemia: Secondary | ICD-10-CM | POA: Diagnosis present

## 2021-08-16 DIAGNOSIS — I2510 Atherosclerotic heart disease of native coronary artery without angina pectoris: Secondary | ICD-10-CM

## 2021-08-16 DIAGNOSIS — B0229 Other postherpetic nervous system involvement: Secondary | ICD-10-CM | POA: Diagnosis present

## 2021-08-16 DIAGNOSIS — R404 Transient alteration of awareness: Secondary | ICD-10-CM

## 2021-08-16 DIAGNOSIS — Z794 Long term (current) use of insulin: Secondary | ICD-10-CM | POA: Diagnosis not present

## 2021-08-16 DIAGNOSIS — Z79899 Other long term (current) drug therapy: Secondary | ICD-10-CM

## 2021-08-16 DIAGNOSIS — C50211 Malignant neoplasm of upper-inner quadrant of right female breast: Secondary | ICD-10-CM

## 2021-08-16 DIAGNOSIS — E1149 Type 2 diabetes mellitus with other diabetic neurological complication: Secondary | ICD-10-CM | POA: Diagnosis present

## 2021-08-16 DIAGNOSIS — G43909 Migraine, unspecified, not intractable, without status migrainosus: Secondary | ICD-10-CM | POA: Diagnosis present

## 2021-08-16 DIAGNOSIS — R4182 Altered mental status, unspecified: Secondary | ICD-10-CM | POA: Diagnosis not present

## 2021-08-16 DIAGNOSIS — G3184 Mild cognitive impairment, so stated: Secondary | ICD-10-CM | POA: Diagnosis not present

## 2021-08-16 DIAGNOSIS — Z803 Family history of malignant neoplasm of breast: Secondary | ICD-10-CM

## 2021-08-16 DIAGNOSIS — R9431 Abnormal electrocardiogram [ECG] [EKG]: Secondary | ICD-10-CM | POA: Diagnosis not present

## 2021-08-16 DIAGNOSIS — I499 Cardiac arrhythmia, unspecified: Secondary | ICD-10-CM | POA: Diagnosis not present

## 2021-08-16 DIAGNOSIS — N179 Acute kidney failure, unspecified: Secondary | ICD-10-CM

## 2021-08-16 DIAGNOSIS — E860 Dehydration: Secondary | ICD-10-CM | POA: Diagnosis present

## 2021-08-16 DIAGNOSIS — I5032 Chronic diastolic (congestive) heart failure: Secondary | ICD-10-CM | POA: Diagnosis present

## 2021-08-16 DIAGNOSIS — Z96652 Presence of left artificial knee joint: Secondary | ICD-10-CM | POA: Diagnosis present

## 2021-08-16 DIAGNOSIS — Z8049 Family history of malignant neoplasm of other genital organs: Secondary | ICD-10-CM

## 2021-08-16 DIAGNOSIS — Z8673 Personal history of transient ischemic attack (TIA), and cerebral infarction without residual deficits: Secondary | ICD-10-CM

## 2021-08-16 DIAGNOSIS — Z7902 Long term (current) use of antithrombotics/antiplatelets: Secondary | ICD-10-CM | POA: Diagnosis not present

## 2021-08-16 DIAGNOSIS — Z888 Allergy status to other drugs, medicaments and biological substances status: Secondary | ICD-10-CM

## 2021-08-16 DIAGNOSIS — Z885 Allergy status to narcotic agent status: Secondary | ICD-10-CM

## 2021-08-16 DIAGNOSIS — R918 Other nonspecific abnormal finding of lung field: Secondary | ICD-10-CM | POA: Diagnosis not present

## 2021-08-16 DIAGNOSIS — Z86718 Personal history of other venous thrombosis and embolism: Secondary | ICD-10-CM

## 2021-08-16 DIAGNOSIS — G9341 Metabolic encephalopathy: Secondary | ICD-10-CM | POA: Diagnosis not present

## 2021-08-16 DIAGNOSIS — I10 Essential (primary) hypertension: Secondary | ICD-10-CM | POA: Diagnosis present

## 2021-08-16 DIAGNOSIS — M1990 Unspecified osteoarthritis, unspecified site: Secondary | ICD-10-CM | POA: Diagnosis present

## 2021-08-16 DIAGNOSIS — D649 Anemia, unspecified: Secondary | ICD-10-CM | POA: Diagnosis not present

## 2021-08-16 DIAGNOSIS — E1122 Type 2 diabetes mellitus with diabetic chronic kidney disease: Secondary | ICD-10-CM | POA: Diagnosis present

## 2021-08-16 DIAGNOSIS — E871 Hypo-osmolality and hyponatremia: Secondary | ICD-10-CM | POA: Diagnosis present

## 2021-08-16 DIAGNOSIS — Z841 Family history of disorders of kidney and ureter: Secondary | ICD-10-CM

## 2021-08-16 DIAGNOSIS — E669 Obesity, unspecified: Secondary | ICD-10-CM | POA: Diagnosis present

## 2021-08-16 DIAGNOSIS — Z7984 Long term (current) use of oral hypoglycemic drugs: Secondary | ICD-10-CM | POA: Diagnosis not present

## 2021-08-16 DIAGNOSIS — E119 Type 2 diabetes mellitus without complications: Secondary | ICD-10-CM | POA: Diagnosis present

## 2021-08-16 DIAGNOSIS — E1165 Type 2 diabetes mellitus with hyperglycemia: Secondary | ICD-10-CM | POA: Diagnosis present

## 2021-08-16 DIAGNOSIS — Z823 Family history of stroke: Secondary | ICD-10-CM

## 2021-08-16 DIAGNOSIS — R0689 Other abnormalities of breathing: Secondary | ICD-10-CM | POA: Diagnosis not present

## 2021-08-16 DIAGNOSIS — Z8261 Family history of arthritis: Secondary | ICD-10-CM

## 2021-08-16 DIAGNOSIS — Z85820 Personal history of malignant melanoma of skin: Secondary | ICD-10-CM

## 2021-08-16 DIAGNOSIS — Z886 Allergy status to analgesic agent status: Secondary | ICD-10-CM

## 2021-08-16 DIAGNOSIS — M1A0720 Idiopathic chronic gout, left ankle and foot, without tophus (tophi): Secondary | ICD-10-CM | POA: Diagnosis present

## 2021-08-16 DIAGNOSIS — G4486 Cervicogenic headache: Secondary | ICD-10-CM | POA: Diagnosis not present

## 2021-08-16 DIAGNOSIS — Z833 Family history of diabetes mellitus: Secondary | ICD-10-CM

## 2021-08-16 DIAGNOSIS — Z923 Personal history of irradiation: Secondary | ICD-10-CM

## 2021-08-16 DIAGNOSIS — J90 Pleural effusion, not elsewhere classified: Secondary | ICD-10-CM | POA: Diagnosis not present

## 2021-08-16 DIAGNOSIS — G43109 Migraine with aura, not intractable, without status migrainosus: Secondary | ICD-10-CM | POA: Diagnosis not present

## 2021-08-16 DIAGNOSIS — N1832 Chronic kidney disease, stage 3b: Secondary | ICD-10-CM

## 2021-08-16 DIAGNOSIS — Z6827 Body mass index (BMI) 27.0-27.9, adult: Secondary | ICD-10-CM

## 2021-08-16 DIAGNOSIS — N184 Chronic kidney disease, stage 4 (severe): Secondary | ICD-10-CM | POA: Diagnosis not present

## 2021-08-16 DIAGNOSIS — Z8249 Family history of ischemic heart disease and other diseases of the circulatory system: Secondary | ICD-10-CM

## 2021-08-16 DIAGNOSIS — Z85828 Personal history of other malignant neoplasm of skin: Secondary | ICD-10-CM

## 2021-08-16 DIAGNOSIS — J9811 Atelectasis: Secondary | ICD-10-CM | POA: Diagnosis not present

## 2021-08-16 DIAGNOSIS — R55 Syncope and collapse: Secondary | ICD-10-CM | POA: Diagnosis not present

## 2021-08-16 DIAGNOSIS — Z79811 Long term (current) use of aromatase inhibitors: Secondary | ICD-10-CM

## 2021-08-16 DIAGNOSIS — Z825 Family history of asthma and other chronic lower respiratory diseases: Secondary | ICD-10-CM

## 2021-08-16 DIAGNOSIS — R4020 Unspecified coma: Secondary | ICD-10-CM | POA: Diagnosis not present

## 2021-08-16 HISTORY — DX: Hypotension, unspecified: I95.9

## 2021-08-16 HISTORY — DX: Gastrointestinal hemorrhage, unspecified: K92.2

## 2021-08-16 LAB — CBC WITH DIFFERENTIAL/PLATELET
Abs Immature Granulocytes: 0.05 10*3/uL (ref 0.00–0.07)
Basophils Absolute: 0.1 10*3/uL (ref 0.0–0.1)
Basophils Relative: 1 %
Eosinophils Absolute: 0.1 10*3/uL (ref 0.0–0.5)
Eosinophils Relative: 1 %
HCT: 42.7 % (ref 36.0–46.0)
Hemoglobin: 14.9 g/dL (ref 12.0–15.0)
Immature Granulocytes: 0 %
Lymphocytes Relative: 18 %
Lymphs Abs: 2.4 10*3/uL (ref 0.7–4.0)
MCH: 30.3 pg (ref 26.0–34.0)
MCHC: 34.9 g/dL (ref 30.0–36.0)
MCV: 86.8 fL (ref 80.0–100.0)
Monocytes Absolute: 1.1 10*3/uL — ABNORMAL HIGH (ref 0.1–1.0)
Monocytes Relative: 9 %
Neutro Abs: 9.4 10*3/uL — ABNORMAL HIGH (ref 1.7–7.7)
Neutrophils Relative %: 71 %
Platelets: 238 10*3/uL (ref 150–400)
RBC: 4.92 MIL/uL (ref 3.87–5.11)
RDW: 13.4 % (ref 11.5–15.5)
WBC: 13.2 10*3/uL — ABNORMAL HIGH (ref 4.0–10.5)
nRBC: 0 % (ref 0.0–0.2)

## 2021-08-16 LAB — URINALYSIS, ROUTINE W REFLEX MICROSCOPIC
Bilirubin Urine: NEGATIVE
Glucose, UA: NEGATIVE mg/dL
Hgb urine dipstick: NEGATIVE
Ketones, ur: NEGATIVE mg/dL
Nitrite: NEGATIVE
Protein, ur: NEGATIVE mg/dL
Specific Gravity, Urine: 1.005 (ref 1.005–1.030)
WBC, UA: >50 WBC/hpf — ABNORMAL HIGH (ref 0–5)
pH: 6 (ref 5.0–8.0)

## 2021-08-16 LAB — COMPREHENSIVE METABOLIC PANEL
ALT: 22 U/L (ref 0–44)
AST: 34 U/L (ref 15–41)
Albumin: 3.8 g/dL (ref 3.5–5.0)
Alkaline Phosphatase: 74 U/L (ref 38–126)
Anion gap: 15 (ref 5–15)
BUN: 28 mg/dL — ABNORMAL HIGH (ref 8–23)
CO2: 32 mmol/L (ref 22–32)
Calcium: 9.6 mg/dL (ref 8.9–10.3)
Chloride: 81 mmol/L — ABNORMAL LOW (ref 98–111)
Creatinine, Ser: 1.93 mg/dL — ABNORMAL HIGH (ref 0.44–1.00)
GFR, Estimated: 26 mL/min — ABNORMAL LOW (ref >60)
Glucose, Bld: 148 mg/dL — ABNORMAL HIGH (ref 70–99)
Potassium: 3.3 mmol/L — ABNORMAL LOW (ref 3.5–5.1)
Sodium: 128 mmol/L — ABNORMAL LOW (ref 135–145)
Total Bilirubin: 0.9 mg/dL (ref 0.3–1.2)
Total Protein: 7.4 g/dL (ref 6.5–8.1)

## 2021-08-16 LAB — I-STAT VENOUS BLOOD GAS, ED
Acid-Base Excess: 16 mmol/L — ABNORMAL HIGH (ref 0.0–2.0)
Bicarbonate: 40.1 mmol/L — ABNORMAL HIGH (ref 20.0–28.0)
Calcium, Ion: 1.03 mmol/L — ABNORMAL LOW (ref 1.15–1.40)
HCT: 45 % (ref 36.0–46.0)
Hemoglobin: 15.3 g/dL — ABNORMAL HIGH (ref 12.0–15.0)
O2 Saturation: 99 %
Potassium: 3 mmol/L — ABNORMAL LOW (ref 3.5–5.1)
Sodium: 126 mmol/L — ABNORMAL LOW (ref 135–145)
TCO2: 41 mmol/L — ABNORMAL HIGH (ref 22–32)
pCO2, Ven: 45.5 mmHg (ref 44–60)
pH, Ven: 7.553 — ABNORMAL HIGH (ref 7.25–7.43)
pO2, Ven: 136 mmHg — ABNORMAL HIGH (ref 32–45)

## 2021-08-16 LAB — TSH: TSH: 1.195 u[IU]/mL (ref 0.350–4.500)

## 2021-08-16 LAB — BRAIN NATRIURETIC PEPTIDE: B Natriuretic Peptide: 40.5 pg/mL (ref 0.0–100.0)

## 2021-08-16 LAB — AMMONIA: Ammonia: 17 umol/L (ref 9–35)

## 2021-08-16 LAB — TROPONIN I (HIGH SENSITIVITY): Troponin I (High Sensitivity): 12 ng/L (ref <18)

## 2021-08-16 MED ORDER — POLYETHYLENE GLYCOL 3350 17 G PO PACK: 17.0000 g | PACK | Freq: Every day | ORAL | Status: DC | PRN

## 2021-08-16 MED ORDER — PRAMIPEXOLE DIHYDROCHLORIDE 0.25 MG PO TABS: 0.5000 mg | ORAL_TABLET | Freq: Every day | ORAL | Status: DC

## 2021-08-16 MED ORDER — SODIUM CHLORIDE 0.9 % IV SOLN
1.0000 g | Freq: Once | INTRAVENOUS | Status: AC
  Administered 2021-08-17: 1 g via INTRAVENOUS
  Filled 2021-08-16: qty 10

## 2021-08-16 MED ORDER — ACETAMINOPHEN 650 MG RE SUPP
650.0000 mg | Freq: Four times a day (QID) | RECTAL | Status: DC | PRN
Start: 2021-08-16 — End: 2021-08-20

## 2021-08-16 MED ORDER — PANTOPRAZOLE SODIUM 40 MG PO TBEC
40.0000 mg | DELAYED_RELEASE_TABLET | Freq: Two times a day (BID) | ORAL | Status: DC
  Administered 2021-08-17 – 2021-08-20 (×7): 40 mg via ORAL
  Filled 2021-08-16 (×7): qty 1

## 2021-08-16 MED ORDER — POTASSIUM CHLORIDE 10 MEQ/100ML IV SOLN
10.0000 meq | INTRAVENOUS | Status: AC
  Administered 2021-08-16 – 2021-08-17 (×2): 10 meq via INTRAVENOUS
  Filled 2021-08-16 (×2): qty 100

## 2021-08-16 MED ORDER — ACETAMINOPHEN 325 MG PO TABS
650.0000 mg | ORAL_TABLET | Freq: Four times a day (QID) | ORAL | Status: DC | PRN
  Administered 2021-08-17 – 2021-08-19 (×5): 650 mg via ORAL
  Filled 2021-08-16 (×5): qty 2

## 2021-08-16 MED ORDER — POTASSIUM CHLORIDE 20 MEQ PO PACK
40.0000 meq | PACK | Freq: Once | ORAL | Status: AC
  Administered 2021-08-17: 40 meq via ORAL
  Filled 2021-08-16: qty 2

## 2021-08-16 MED ORDER — SODIUM CHLORIDE 0.9 % IV SOLN: INTRAVENOUS | Status: AC

## 2021-08-16 MED ORDER — SODIUM CHLORIDE 0.9% FLUSH
3.0000 mL | Freq: Two times a day (BID) | INTRAVENOUS | Status: DC
  Administered 2021-08-16 – 2021-08-20 (×5): 3 mL via INTRAVENOUS

## 2021-08-16 MED ORDER — ROSUVASTATIN CALCIUM 20 MG PO TABS
20.0000 mg | ORAL_TABLET | Freq: Every day | ORAL | Status: DC
  Administered 2021-08-17 – 2021-08-20 (×4): 20 mg via ORAL
  Filled 2021-08-16 (×4): qty 1

## 2021-08-16 MED ORDER — ENOXAPARIN SODIUM 30 MG/0.3ML IJ SOSY: 30.0000 mg | PREFILLED_SYRINGE | Freq: Every day | INTRAMUSCULAR | Status: DC

## 2021-08-16 MED ORDER — SODIUM CHLORIDE 0.9 % IV BOLUS
500.0000 mL | Freq: Once | INTRAVENOUS | Status: AC
  Administered 2021-08-16: 500 mL via INTRAVENOUS

## 2021-08-16 MED ORDER — EXEMESTANE 25 MG PO TABS
25.0000 mg | ORAL_TABLET | Freq: Every day | ORAL | Status: DC
  Administered 2021-08-17 – 2021-08-20 (×4): 25 mg via ORAL
  Filled 2021-08-16 (×4): qty 1

## 2021-08-16 NOTE — H&P (Signed)
History and Physical   [name] UEK:[id number] DOB: 03/21/1941 DOA: 08/16/2021  PCP: Cipriano Mile, NP   Patient coming from: Home  Chief Complaint: AMS, Weakness  HPI: [name] is a 81 y.o. female with medical history significant of mild cognitive impairment/dementia, CAD, hypertension, hyperlipidemia, CKD 4, CHF, migraines, anxiety, GERD, RLS, diabetes, gout, CVA, DVT/PE, breast cancer presenting after episode of altered mental status and weakness.  History obtained with assistance of chart review and family due to patient's altered mental status and baseline dementia.  Patient had had some alteration of her baseline dementia for the past several days though significantly worse today.  She was weaker and unable to get up unaided.  She had an episode where she stated that "she wanted to meet the Lord "and she became unresponsive and rigid. Family reports patient lost heart heart rate and then family attempted chest compressions followed by the patient coming around and telling them to stop.  Patient has returned to near baseline mentation but remains weak and fatigued in the ED.  Patient denies fevers, chills, chest pain, shortness of breath, abdominal pain, constipation, diarrhea, nausea, vomiting.   ED Course: Vital signs in the ED significant for blood pressure in the 15A to 569 systolic.  Lab work-up included CMP with sodium 128, chloride 81, potassium 3 3, BUN 28, creatinine elevated to 1.93 from baseline around 1.4, glucose 140.  CBC with leukocytosis of 13.2.  Troponin normal with repeat pending.  BNP normal.  TSH normal.  Ammonia level normal.  VBG with mildly elevated pH at 7.55 and normal PCO2.  Urinalysis showing leukocytes and bacteria.  Chest x-[place] showed no acute Hilda Blades.  CT head showed no acute abnormality.  Patient received dose ceftriaxone, 20 mEq of IV potassium and a 500 cc bolus.  Review of Systems: As per HPI otherwise all other systems reviewed and are  negative  Past Medical History:  Diagnosis Date   Acute blood loss anemia 02/19/2020   Acute renal failure superimposed on stage 4 chronic kidney disease ([place]) 02/21/2020   Acute respiratory failure with hypoxia ([place]) 02/21/2020   Anemia    have received iron infusions   Anxiety    Arthritis     osteoarthritis   Breast cancer ([place])    Breast cancer in female [facility])    Bilateral   CAD S/P percutaneous coronary angioplasty 2007; March 2011   [facility] 3.0 mm 20 mm postdilated 3.6 mm in early mid LAD; status post ISR Cutting Balloon PTCA and March 11 along with PCI of distal mid lesion with a 3.0 mm 12 mm MultiLink vision BMS; the proximal stent causes jailing of SP1 and SP2 with ostial 70-80% lesions   Cancer ([place])    Melanoma, Squamous cell Carcinoma   Charcot's joint of foot, right    Charcot's joint of left foot    Chronic diastolic CHF (congestive heart failure), NYHA class 2 ([place]) 02/28/2018   Chronic kidney disease    stage 2    Closed fracture of neck of left femur ([place]) 02/08/2020   Coronary artery disease    Dementia ([place])    mild   Diabetes mellitus type 2 with neurological manifestations ([place])    Diabetes mellitus without complication (HCC)    Dyslipidemia, goal LDL below 70    Foot pain, bilateral    Full dentures    Genetic testing 12/03/2016   Germline genetic testing was performed through [employer]'s Common Hereditary Cancers Panel + [employer]'s Melanoma Panel.  This custom panel includes analysis of the following 51 genes: APC, ATM, AXIN2, BAP1, BARD1, BMPR1A, BRCA1, BRCA2, BRIP1, CDH1, CDK4, CDKN2A, CHEK2, CTNNA1, DICER1, EPCAM, GREM1, HOXB13, KIT, MEN1, MITF, MLH1, MSH2, MSH3, MSH6, MUTYH, NBN, NF1, NTHL1, PALB2, PDGFRA, PMS2, POLD1, POL   GERD (gastroesophageal reflux disease)    GIB (gastrointestinal bleeding)    Gout    Headache(784.0)    Hip fracture ([place]) 02/08/2020   History of hematuria    Followed by Dr. Gaynelle Arabian   Hypertension, essential, benign     Hypotension    Migraine    Mild cognitive impairment    Peripheral neuropathy    Personal history of radiation therapy    Post herpetic neuralgia    Restless leg syndrome    Stroke ([place])    TIA (transient ischemic attack)    multiple in the past   TIA (transient ischemic attack)    Wears glasses     Past Surgical History:  Procedure Laterality Date   ABDOMINAL HYSTERECTOMY     ANKLE FUSION Right 02/22/2018   Procedure: RIGHT SUBTALAR AND TALONAVICULAR FUSION;  Surgeon: Newt Minion, MD;  Location: [place];  Service: Orthopedics;  Laterality: Right;   APPENDECTOMY     BIOPSY  02/19/2020   Procedure: BIOPSY;  Surgeon: Otis Brace, MD;  Location: [facility];  Service: Gastroenterology;;   BLADDER SUSPENSION     BREAST LUMPECTOMY Bilateral 2018   BREAST LUMPECTOMY WITH RADIOACTIVE SEED AND SENTINEL LYMPH NODE BIOPSY Bilateral 12/14/2016   Procedure: BILATERAL BREAST LUMPECTOMY WITH BILATERAL  RADIOACTIVE SEED AND BILATERAL AXILLARY  SENTINEL LYMPH NODE BIOPSY ERAS PATHWAY;  Surgeon: Alphonsa Overall, MD;  Location: [place];  Service: General;  Laterality: Bilateral;  [place]  02/24/2006   85% stenosis in the proximal portion of LAD-arrangements made for PCI on 02/25/2006   CARDIAC CATHETERIZATION  02/25/2006   80% LAD lesion stented with a 3x21m Liberte stent resulting in reduction of 80% lesion to 0% residual   CARDIAC CATHETERIZATION  04/26/2006   Medical management   CARDIAC CATHETERIZATION  06/24/2009   60-70% re-stenosis in the proximal LAD. A 3.25x15 cutting balloon, 3 inflations - 14atm-38sec, 13atm-39sec, and 12atm-40sec reduced to less than 10%. 60% stenosis of the mid/distal LAD stented with a 3x16mMultilink stent.   CARDIAC CATHETERIZATION  07/09/2009   Medical management   CARDIAC CATHETERIZATION     CAROTID DOPPLER  06/24/2009   40-59% R ICA stenosis. No significant ICA stenosis noted   CHOLECYSTECTOMY     CORONARY ANGIOPLASTY      DILATION AND CURETTAGE OF UTERUS     ESOPHAGOGASTRODUODENOSCOPY N/A 02/26/2020   Procedure: ESOPHAGOGASTRODUODENOSCOPY (EGD);  Surgeon: MaClarene EssexMD;  [facility] Service: Endoscopy;  Laterality: N/A;   ESOPHAGOGASTRODUODENOSCOPY (EGD) WITH PROPOFOL N/A 02/19/2020   Procedure: ESOPHAGOGASTRODUODENOSCOPY (EGD) WITH PROPOFOL;  Surgeon: BrOtis BraceMD;  [facility] Service: Gastroenterology;  Laterality: N/A;   ESOPHAGOGASTRODUODENOSCOPY (EGD) WITH PROPOFOL Left 02/23/2020   Procedure: ESOPHAGOGASTRODUODENOSCOPY (EGD) WITH PROPOFOL;  Surgeon: OuArta SilenceMD;  [place] Service: Endoscopy;  Laterality: Left;   HIP ARTHROPLASTY Left 02/10/2020   Procedure: ARTHROPLASTY BIPOLAR HIP (HEMIARTHROPLASTY) POSTERIOR LATERAL;  Surgeon: MuRenette ButtersMD;  [place] [facility] Service: Orthopedics;  Laterality: Left;   JOINT REPLACEMENT Left    LESION REMOVAL Right 03/11/2015   Procedure:  EXCISION OF RIGHT PRE TIBIAL LESION;  Surgeon: JaJudeth HornMD;  [place] [facility] Service:  General;  Laterality: Right;   MASS EXCISION Left 06/10/2014   Procedure: EXCISION LEFT LOWER LEG LESION;  Surgeon: Doreen Salvage, MD;  Location: [place];  Service: General;  Laterality: Left;   MASS EXCISION Left 09/05/2015   Procedure: EXCISION OF LEFT FOREARM  MASS;  Surgeon: Judeth Horn, MD;  Location: [place];  Service: General;  Laterality: Left;   NM MYOVIEW LTD  10/2011   No ischemia or infarction.  Normal EF.  LOW RISK. (Short bursts of 5 beats NSVT during recovery otherwise normal)   POLYPECTOMY  02/19/2020   Procedure: POLYPECTOMY;  Surgeon: Otis Brace, MD;  Location: [facility];  Service: Gastroenterology;;   SHOULDER ARTHROSCOPY  10/15   SHOULDER ARTHROSCOPY  1995   left   TEE WITHOUT CARDIOVERSION N/A 08/03/2017   Procedure: TRANSESOPHAGEAL ECHOCARDIOGRAM (TEE);  Surgeon: Skeet Latch, MD;  Location:  [place];  Service: Cardiovascular;; EF 60-65% with no wall motion normalities.  No atrial thrombus noted.  Lightly findings consistent with a lipomatous hypertrophy of the atrial septum.    TENDON REPAIR Left 05/12/2016   Procedure: Left Anterior Tibial Tendon Reconstruction;  Surgeon: Newt Minion, MD;  Location: [place];  Service: Orthopedics;  Laterality: Left;   TOTAL KNEE ARTHROPLASTY  2005   left   TRANSESOPHAGEAL ECHOCARDIOGRAM  08/03/2016   : EF 60-65% no wall motion normalities.  No atrial thrombus.  Lipomatous hypertrophy of the atrial septum.  No mass noted.   TRANSESOPHAGEAL ECHOCARDIOGRAM  07/2017   EF 60 to 65%.  No R WMA.  No atrial thrombus noted.  Lipomatous IAS.   TRANSTHORACIC ECHOCARDIOGRAM  02/25/2017   :EF 65-70%.  GR 1 DD.  No or WMA.  Moderate aortic calcification/sclerosis but no stenosis.   TRANSTHORACIC ECHOCARDIOGRAM  07/2017   Vigorous LV function.  EF 65-70%.  Mild diastolic dysfunction with no RWMA. GR 1 DD.  Mild aortic sclerosis/no stenosis.  Questionable right atrial mass discounted with TEE)    WRIST ARTHROPLASTY  2010   cancer lt wrist    Social History  reports that she has never smoked. She has never used smokeless tobacco. She reports that she does not drink alcohol and does not use drugs.  Allergies  Allergen Reactions   Nsaids Nausea And Vomiting and Palpitations   Reglan [Metoclopramide] Other (See Comments)    Seizures    Tramadol Other (See Comments)    Seizures    Ultram [Tramadol] Other (See Comments)    Pt has seizure activity with this medication   Lipitor [Atorvastatin] Other (See Comments)    Bone and muscle pain   Lipitor [Atorvastatin] Swelling and Other (See Comments)    Pain    Lyrica [Pregabalin] Other (See Comments)    Weight gain, extremity swelling   Lyrica [Pregabalin] Swelling   Reglan [Metoclopramide] Other (See Comments)    Tardive dyskinesia; paradoxical reaction not relieved by benadryl   Nsaids Swelling     SWELLING REACTION UNSPECIFIED    Urecholine [Bethanechol]     Palpitations and nausea    Family History  Problem Relation Age of Onset   Diabetes Mother    Kidney disease Mother    Diabetes Sister    Diabetes Brother    Hypertension Mother    Hypertension Father    Emphysema Father    Heart attack Father    Heart disease Father    Arthritis Sister    Diabetes Sister    Hypertension Sister    Breast  cancer Sister 69       treated with mastectomy   Cervical cancer Sister 63   Hypertension Brother    Hyperlipidemia Brother    Diabetes Brother    Stroke Brother    Diabetes Sister    Hypertension Sister    Stomach cancer Sister 24   Hypertension Brother    ALS Brother        d.62s   Breast cancer Daughter 90       triple negative treated with lumpectomy, chemo, radiation   Heart attack Daughter 57       d.45   COPD Daughter    Cancer Paternal Aunt        unspecified type   Cancer Paternal Uncle        unspecified type   Cancer Paternal Uncle        unspecified type  Reviewed on admission  Prior to Admission medications   Medication Sig Start Date End Date Taking? Authorizing Provider  acetaminophen-codeine (TYLENOL #3) 300-30 MG tablet Take 1 tablet by mouth every 4 (four) hours as needed for moderate pain.    [provider]  ALPRAZolam Duanne Moron) 0.25 MG tablet Take 1-2 tablets (0.25-0.5 mg total) by mouth 2 (two) times daily as needed for anxiety. 04/17/21   Truitt Merle, MD  BIOTIN PO Take 1 tablet by mouth daily.    [provider]  Blood Glucose Monitoring Suppl (FREESTYLE LITE) w/Device KIT use as directed 07/21/20   Kayleen Memos, DO  bumetanide (BUMEX) 2 MG tablet Take 2 mg by mouth 3 (three) times daily.    [provider]  Calcium Carb-Cholecalciferol 600-800 MG-UNIT TABS Take 1 tablet by mouth daily.    [provider]  Cholecalciferol (VITAMIN D) 50 MCG (2000 UT) tablet Take 2,000 Units by mouth daily.    [provider]  clopidogrel (PLAVIX) 75 MG tablet Take 75 mg by mouth daily.    [provider]  Coenzyme Q10 (COQ-10 PO) Take 1 tablet by mouth daily.    [provider]  COLCRYS 0.6 MG tablet TAKE 1 TABLET DAILY AS NEEDED FOR GOUT FLARE. 12/27/18   Newt Minion, MD  Cranberry 500 MG TABS Take 500 mg by mouth every evening.    [provider]  CRANBERRY-CALCIUM PO Take 1 tablet by mouth daily.    [provider]  diazepam (VALIUM) 5 MG tablet Take 5 mg by mouth 3 (three) times daily as needed for anxiety.    [provider]  exemestane (AROMASIN) 25 MG tablet Take 1 tablet (25 mg total) by mouth daily after breakfast. TAKE 1 TABLET DAILY AFTER BREAKFAST 04/17/21   Truitt Merle, MD  febuxostat (ULORIC) 40 MG tablet Take 40 mg by mouth daily. 12/31/19   [provider]  gabapentin (NEURONTIN) 300 MG capsule Take 300 mg by mouth 2 (two) times daily.  12/18/19   [provider]  glipiZIDE (GLUCOTROL) 5 MG tablet Take 2.5 mg by mouth daily before breakfast.    [provider]  glucose blood (FREESTYLE LITE) test strip Test blood sugars up to 4 times daily 07/21/20   Kayleen Memos, DO  insulin glargine (LANTUS) 100 UNIT/ML Solostar Pen Inject 5 Units into the skin daily. 07/21/20   Kayleen Memos, DO  Insulin Pen Needle (PENTIPS) 32G X 4 MM MISC Use as directed 07/21/20   Kayleen Memos, DO  Insulin Pen Needle 32G X 4 MM MISC 5 Units by Does  not apply route in the morning and at bedtime. 07/21/20   Kayleen Memos, DO  insulin regular (NOVOLIN R) 100 units/mL injection Inject into the skin 3 (three) times daily before meals. Started last week 4/28: Sliding scale before meals: <150 none,                                                                           151-199 - 2 units                                                                            200-249 - 4 units                                                                            250-299 - 6  units                                                                            300-349 - 8 units                                                                            350> 10 units    [provider]  JANUVIA 25 MG tablet Take 25 mg by mouth daily. 12/26/19   [provider]  Javier Docker Oil 500 MG CAPS Take 500 mg by mouth daily.    [provider]  Lancets (FREESTYLE) lancets Test blood sugars up to 4 times daily 07/21/20   Kayleen Memos, DO  Methylfol-Methylcob-Acetylcyst (CEREFOLIN NAC) 6-2-600 MG TABS TAKE 1 TABLET BY MOUTH EVERY DAY 06/07/19   Garvin Fila, MD  metolazone (ZAROXOLYN) 2.5 MG tablet Take 2.5 mg by mouth daily. 30 minutes before Bumetanide Mon. & Thurs.    [provider]  metoprolol succinate (TOPROL-XL) 25 MG 24 hr tablet Take 25 mg by mouth at bedtime.  12/27/19   [provider]  mirtazapine (REMERON) 15 MG tablet Take 15 mg by mouth at bedtime. Taking 1 1/2 tablet at bedtime    [provider]  Multiple Vitamin (MULTIVITAMIN WITH MINERALS) TABS tablet  Take 1 tablet by mouth daily. 03/03/20   Terrilee Croak, MD  nitroGLYCERIN (NITROLINGUAL) 0.4 MG/SPRAY spray Place 1 spray under the tongue every 5 (five) minutes x 3 doses as needed for chest pain.    [provider]  pantoprazole (PROTONIX) 40 MG tablet Take 1 tablet (40 mg total) by mouth 2 (two) times daily before a meal. 03/02/20   Dahal, Marlowe Aschoff, MD  potassium chloride SA (KLOR-CON) 20 MEQ tablet Take 20 mEq by mouth 2 (two) times daily.    [provider]  pramipexole (MIRAPEX) 0.25 MG tablet TAKE 1 TO 2 TABLETS AS INSTRUCTED, 1 TABLET AT 4:00 P.M. AND 2 TABLETS AT BEDTIME 09/27/19   Frann Rider, NP  RESVERATROL-GRAPE PO Take 500 mg by mouth.    [provider]  rosuvastatin (CRESTOR) 20 MG tablet Take 20 mg by mouth daily.    [provider]    Physical Exam: Vitals:   08/16/21 2115 08/16/21 2130 08/16/21 2145 08/16/21 2200  BP:  100/64 104/63 116/76 96/63  Pulse: 77 78 82 76  Resp: 20 (!) 22 (!) 22 (!) 21  Temp: 98.2 F (36.8 C)     TempSrc: Oral     SpO2: 94% 97% 96% 96%    Physical Exam Constitutional:      General: She is not in acute distress.    Appearance: Normal appearance.  HENT:     Head: Normocephalic and atraumatic.     Mouth/Throat:     Mouth: Mucous membranes are moist.     Pharynx: Oropharynx is clear.  Eyes:     Extraocular Movements: Extraocular movements intact.     Pupils: Pupils are equal, round, and reactive to light.  Cardiovascular:     Rate and Rhythm: Normal rate and regular rhythm.     Pulses: Normal pulses.     Heart sounds: Normal heart sounds.  Pulmonary:     Effort: Pulmonary effort is normal. No respiratory distress.     Breath sounds: Normal breath sounds.  Abdominal:     General: Bowel sounds are normal. There is no distension.     Palpations: Abdomen is soft.     Tenderness: There is no abdominal tenderness.  Musculoskeletal:        General: No swelling or deformity.  Skin:    General: Skin is warm and dry.  Neurological:     General: No focal deficit present.     Mental Status: Mental status is at baseline.   Labs on Admission: I have personally reviewed following labs and imaging studies  CBC: Recent Labs  Lab 08/16/21 2010 08/16/21 2017  WBC 13.2*  --   NEUTROABS 9.4*  --   HGB 14.9 15.3*  HCT 42.7 45.0  MCV 86.8  --   PLT 238  --     Basic Metabolic Panel: Recent Labs  Lab 08/16/21 2010 08/16/21 2017  NA 128* 126*  K 3.3* 3.0*  CL 81*  --   CO2 32  --   GLUCOSE 148*  --   BUN 28*  --   CREATININE 1.93*  --   CALCIUM 9.6  --     GFR: CrCl cannot be calculated (Unknown ideal weight.).  Liver Function Tests: Recent Labs  Lab 08/16/21 2010  AST 34  ALT 22  ALKPHOS 74  BILITOT 0.9  PROT 7.4  ALBUMIN 3.8    Urine analysis:    Component Value Date/Time   COLORURINE YELLOW 08/16/2021 2210   APPEARANCEUR HAZY (A)  08/16/2021  2210   LABSPEC 1.005 08/16/2021 2210   PHURINE 6.0 08/16/2021 2210   GLUCOSEU NEGATIVE 08/16/2021 2210   HGBUR NEGATIVE 08/16/2021 2210   BILIRUBINUR NEGATIVE 08/16/2021 2210   KETONESUR NEGATIVE 08/16/2021 2210   PROTEINUR NEGATIVE 08/16/2021 2210   UROBILINOGEN 0.2 10/05/2013 0059   NITRITE NEGATIVE 08/16/2021 2210   LEUKOCYTESUR MODERATE (A) 08/16/2021 2210    Radiological Exams on Admission: CT Head Wo Contrast  Result Date: 08/16/2021 CLINICAL DATA:  Altered mental status. EXAM: CT HEAD WITHOUT CONTRAST TECHNIQUE: Contiguous axial images were obtained from the base of the skull through the vertex without intravenous contrast. RADIATION DOSE REDUCTION: This exam was performed according to the departmental dose-optimization program which includes automated exposure control, adjustment of the mA and/or kV according to patient size and/or use of iterative reconstruction technique. COMPARISON:  CT head dated 11/26/2019. FINDINGS: Brain: No evidence of acute infarction, hemorrhage, hydrocephalus, extra-axial collection or mass lesion/mass effect. Periventricular white matter hypoattenuation likely represents chronic small vessel ischemic disease. Vascular: There are vascular calcifications in the carotid siphons. Skull: Normal. Negative for fracture or focal lesion. Sinuses/Orbits: No acute finding. Other: None. IMPRESSION: No acute intracranial process. Electronically Signed   By: Zerita Boers M.D.   On: 08/16/2021 20:36   DG Chest Portable 1 View  Result Date: 08/16/2021 CLINICAL DATA:  Altered mental status. EXAM: PORTABLE CHEST 1 VIEW COMPARISON:  Chest radiograph dated 05/12/2021. FINDINGS: The patient is rotated to the right. The heart size and mediastinal contours are within normal limits. Vascular calcifications are seen in the aortic arch. both lungs are clear. The visualized skeletal structures are unremarkable. IMPRESSION: No active disease. Aortic Atherosclerosis (ICD10-I70.0).  Electronically Signed   By: Zerita Boers M.D.   On: 08/16/2021 20:25    EKG: Independently reviewed.  Sinus rhythm at 83 bpm.  Nonspecific intraventricular conduction delay with QRS of 113.  Assessment/Plan Principal Problem:   AMS (altered mental status) Active Problems:   Anxiety   CAD S/P percutaneous coronary angioplasty   Essential hypertension   Dyslipidemia, goal LDL below 70   GERD (gastroesophageal reflux disease)   Mild cognitive impairment   Restless legs syndrome   CKD (chronic kidney disease) stage 4, GFR 15-29 ml/min (HCC)   Headache, migraine   Malignant 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, estrogen receptor positive (HCC)   Chronic idiopathic gout involving toe of left foot without tophus   Chronic diastolic CHF (congestive heart failure), NYHA class 2 (HCC)   Type 2 diabetes mellitus with vascular disease ([place])   History of CVA (cerebrovascular accident)   Altered mental status Weakness UTI Dehydration > Altered off her baseline dementia recently with episode today of becoming rigid and unresponsive briefly but returning to responsiveness and asking family to stop when they started compressions. > Has returned to near baseline mentation in the ED per family.  But does remain weak. > Concern for possible infectious etiology given she has leukocytosis and some leukocytes and bacteria in her urine.  Ceftriaxone has been started in the ED.  Suspect dehydration/AKI is also contributing as below. > We will also correct electrolytes with mild hyponatremia and mild hypokalemia. > Chest x-Grilli and CT head without acute normality. - Monitor on telemetry - Continue with ceftriaxone - Trend fever curve and leukocytosis - Gentle IV fluids in the setting of CHF - PT/OT eval and treat - Correct electrolytes as below  AKI Hypokalemia Hyponatremia > Patient presenting with creatinine elevated to 1.93 from  baseline 1.4, sodium 128, potassium 3.3. >  Ordered 500 cc IV fluids in the ED as well as 20 mEq IV potassium. - We will continue with gentle fluids overnight considering her history of CHF - We will add 40 mEq p.o. potassium - Trend renal function electrolytes - Avoid nephrotoxic agents  CAD > Status post stenting - Continue home metoprolol - Continue home rosuvastatin  CHF > Last echo in 2021 with EF 60-65%, G1 DD, normal RV function. - Receiving gentle IV fluids as above - Holding home Bumex and metolazone - Continue home metoprolol  Hyperlipidemia - Continue home rosuvastatin  Hypertension - Continue home metoprolol - Hold home Bumex and metolazone  Anxiety - Continue home as needed Xanax - Continue home Remeron  GERD - Continue home PPI  Diabetes - SSI  Gout - Continue home Febuxostat  History of CVA - Continue home rosuvastatin  RLS - Continue home pramipexole  History of DVT and PE - Eliquis 5 mg twice daily  DVT prophylaxis: Eliquis Code Status:   Full  Family Communication:  Updated at bedside  Disposition Plan:   Patient is from:  Home  Anticipated DC to:  Pending clinical course  Anticipated DC date:  1 to 3 days  Anticipated DC barriers: None  Consults called:  None Admission status:  Observation, telemetry  Severity of Illness: The appropriate patient status for this patient is OBSERVATION. Observation status is judged to be reasonable and necessary in order to provide the required intensity of service to ensure the patient's safety. The patient's presenting symptoms, physical exam findings, and initial radiographic and laboratory data in the context of their medical condition is felt to place them at decreased risk for further clinical deterioration. Furthermore, it is anticipated that the patient will be medically stable for discharge from the hospital within 2 midnights of admission.    Marcelyn Bruins MD [facility]  How to contact the [facility] Attending or Consulting  provider [place] or covering provider during after hours [place], for this patient?   Check the care team in [facility] and look for a) attending/consulting [facility] provider listed and b) the [facility] team listed Log into [email or web address] and use [place] universal password to access. If you do not have the password, please contact the hospital operator. Locate the [facility] provider you are looking for under [facility] and page to a number that you can be directly reached. If you still have difficulty reaching the provider, please page the [facility] (Director on Call) for the Hospitalists listed on amion for assistance.  08/16/2021, 11:07 PM

## 2021-08-16 NOTE — ED Triage Notes (Addendum)
Pt bib [facility] from home, family called EMS reporting AMS for the past few days. Per family, pt stated she "wanted to meet the Lord, became unresponsive and rigid, chest compressions were started and pt told them to stop. Hx seizures, stroke one year ago. Currently oriented to self and time.

## 2021-08-16 NOTE — ED Provider Notes (Signed)
[place] EMERGENCY DEPARTMENT Provider Note   CSN: [id number] Arrival date & time: 08/16/21  1936     History  Chief Complaint  Patient presents with   Altered Mental Status    [name] is a 81 y.o. female with a history of CAD, HTN, dementia, CKD, prior breast cancer, CHF, DM, CVA presenting to the ED with an episode of altered mental status.  History obtained from the patient's daughter.  She states that today the patient has been very fatigued and generally weak.  Usually she is able to ambulate with a rollator, but she has not been able to get out of bed today.  She then was attempting to get out of bed and sitting on the side of the bed when she had an episode where she reportedly turned her head towards the left and her entire body became stiff and she was unresponsive for about 30 seconds.  Her daughter states that she lifted her up and attempted to do CPR standing but the patient woke up.  A few minutes later she had another very similar episode but this lasted about 2 minutes.  No generalized tonic-clonic movements.  Since then, patient has remained somewhat tired and overall fatigued but currently does not have any complaints in the ED.  Denies any chest pain, shortness of breath, fevers, abdominal pain, nausea/vomiting/diarrhea.   Altered Mental Status Associated symptoms: weakness   Associated symptoms: no abdominal pain, no fever, no nausea and no vomiting       Home Medications Prior to Admission medications   Medication Sig Start Date End Date Taking? Authorizing Provider  bumetanide (BUMEX) 2 MG tablet Take 4 mg by mouth daily.   Yes [provider]  COLCRYS 0.6 MG tablet TAKE 1 TABLET DAILY AS NEEDED FOR GOUT FLARE. Patient taking differently: Take 0.6 mg by mouth daily as needed (gout). 12/27/18  Yes Newt Minion, MD  diazepam (VALIUM) 5 MG tablet Take 5 mg by mouth 3 (three) times daily as needed for anxiety.   Yes [provider]  ELIQUIS 5 MG TABS tablet Take 5 mg by mouth 2 (two) times daily. 07/13/21  Yes [provider]  febuxostat (ULORIC) 40 MG tablet Take 40 mg by mouth daily. 12/31/19  Yes [provider]  gabapentin (NEURONTIN) 300 MG capsule Take 300 mg by mouth at bedtime. 12/18/19  Yes [provider]  glipiZIDE (GLUCOTROL) 5 MG tablet Take 2.5 mg by mouth daily before breakfast.   Yes [provider]  JANUVIA 25 MG tablet Take 25 mg by mouth daily. 12/26/19  Yes [provider]  Javier Docker Oil 500 MG CAPS Take 500 mg by mouth daily.   Yes [provider]  metolazone (ZAROXOLYN) 2.5 MG tablet Take 2.5 mg by mouth daily. 30 minutes before Bumetanide Mon. & Thurs.   Yes [provider]  metoprolol succinate (TOPROL-XL) 25 MG 24 hr tablet Take 25 mg by mouth at bedtime.  12/27/19  Yes [provider]  mirtazapine (REMERON) 15 MG tablet Take 15 mg by mouth at bedtime.   Yes [provider]  pantoprazole (PROTONIX) 40 MG tablet Take 1 tablet (40 mg total) by mouth 2 (two) times daily before a meal. 03/02/20  Yes Dahal, Marlowe Aschoff, MD  rosuvastatin (CRESTOR) 20 MG tablet Take 20 mg by mouth daily.   Yes [provider]  acetaminophen-codeine (TYLENOL #3) 300-30 MG tablet Take 1 tablet by mouth every 4 (four) hours as  needed for moderate pain.    [provider]  ALPRAZolam Duanne Moron) 0.25 MG tablet Take 1-2 tablets (0.25-0.5 mg total) by mouth 2 (two) times daily as needed for anxiety. 04/17/21   Truitt Merle, MD  BIOTIN PO Take 1 tablet by mouth daily. Patient not taking: Reported on 08/16/2021    [provider]  Blood Glucose Monitoring Suppl (FREESTYLE LITE) w/Device KIT use as directed 07/21/20   Kayleen Memos, DO  Calcium Carb-Cholecalciferol 600-800 MG-UNIT TABS Take 1 tablet by mouth daily. Patient not taking: Reported on 08/16/2021    [provider]  Cholecalciferol (VITAMIN D) 50 MCG (2000 UT) tablet  Take 2,000 Units by mouth daily. Patient not taking: Reported on 08/16/2021    [provider]  clopidogrel (PLAVIX) 75 MG tablet Take 75 mg by mouth daily. Patient not taking: Reported on 08/16/2021    [provider]  Coenzyme Q10 (COQ-10 PO) Take 1 tablet by mouth daily. Patient not taking: Reported on 08/16/2021    [provider]  Cranberry 500 MG TABS Take 500 mg by mouth every evening. Patient not taking: Reported on 08/16/2021    [provider]  CRANBERRY-CALCIUM PO Take 1 tablet by mouth daily. Patient not taking: Reported on 08/16/2021    [provider]  exemestane (AROMASIN) 25 MG tablet Take 1 tablet (25 mg total) by mouth daily after breakfast. TAKE 1 TABLET DAILY AFTER BREAKFAST 04/17/21   Truitt Merle, MD  glucose blood (FREESTYLE LITE) test strip Test blood sugars up to 4 times daily 07/21/20   Kayleen Memos, DO  insulin glargine (LANTUS) 100 UNIT/ML Solostar Pen Inject 5 Units into the skin daily. 07/21/20   Kayleen Memos, DO  Insulin Pen Needle (PENTIPS) 32G X 4 MM MISC Use as directed 07/21/20   Kayleen Memos, DO  Insulin Pen Needle 32G X 4 MM MISC 5 Units by Does not apply route in the morning and at bedtime. 07/21/20   Kayleen Memos, DO  insulin regular (NOVOLIN R) 100 units/mL injection Inject into the skin 3 (three) times daily before meals. Started last week 4/28: Sliding scale before meals: <150 none,                                                                           151-199 - 2 units                                                                            200-249 - 4 units  250-299 - 6 units                                                                            300-349 - 8 units                                                                            350> 10 units    [provider]  Lancets (FREESTYLE) lancets Test blood sugars up to 4  times daily 07/21/20   Kayleen Memos, DO  Methylfol-Algae-B12-Acetylcyst (CEREFOLIN NAC PO) Take 1 tablet by mouth in the morning.    [provider]  Methylfol-Methylcob-Acetylcyst (CEREFOLIN NAC) 6-2-600 MG TABS TAKE 1 TABLET BY MOUTH EVERY DAY Patient not taking: Reported on 08/16/2021 06/07/19   Garvin Fila, MD  Multiple Vitamin (MULTIVITAMIN WITH MINERALS) TABS tablet Take 1 tablet by mouth daily. Patient not taking: Reported on 08/16/2021 03/03/20   Terrilee Croak, MD  nitroGLYCERIN (NITROLINGUAL) 0.4 MG/SPRAY spray Place 1 spray under the tongue every 5 (five) minutes x 3 doses as needed for chest pain.    [provider]  potassium chloride SA (KLOR-CON) 20 MEQ tablet Take 20 mEq by mouth 2 (two) times daily. Patient not taking: Reported on 08/16/2021    [provider]  pramipexole (MIRAPEX) 0.25 MG tablet TAKE 1 TO 2 TABLETS AS INSTRUCTED, 1 TABLET AT 4:00 P.M. AND 2 TABLETS AT BEDTIME Patient taking differently: See admin instructions. Pt says she is taking 3 tablets po at bedtime 09/27/19   Frann Rider, NP      Allergies    Nsaids, Reglan [metoclopramide], Tramadol, Ultram [tramadol], Lipitor [atorvastatin], Lipitor [atorvastatin], Lyrica [pregabalin], Lyrica [pregabalin], Reglan [metoclopramide], Nsaids, and Urecholine [bethanechol]    Review of Systems   Review of Systems  Constitutional:  Positive for fatigue. Negative for fever.  Respiratory:  Negative for shortness of breath.   Cardiovascular:  Negative for chest pain.  Gastrointestinal:  Negative for abdominal pain, diarrhea, nausea and vomiting.  Neurological:  Positive for syncope and weakness.   Physical Exam Updated Vital Signs BP 95/69   Pulse 73   Temp 98.2 F (36.8 C) (Oral)   Resp (!) 21   SpO2 96%  Physical Exam Constitutional:      Appearance: She is obese. She is ill-appearing. She is not toxic-appearing or diaphoretic.     Comments: Elderly chronically ill-appearing.  HENT:      Head: Normocephalic and atraumatic.     Right Ear: External ear normal.     Left Ear: External ear normal.     Mouth/Throat:     Mouth: Mucous membranes are dry.     Pharynx: Oropharynx is clear.  Eyes:     General: No scleral icterus. Cardiovascular:     Rate and Rhythm: Normal rate and regular rhythm.     Heart sounds: Normal heart sounds. No murmur heard.   No friction rub. No gallop.  Pulmonary:     Effort: Pulmonary effort is normal. No respiratory distress.     Breath sounds: Normal breath sounds. No stridor. No wheezing, rhonchi or rales.  Abdominal:     General: There is no distension.     Palpations: Abdomen is soft.     Tenderness: There is no abdominal tenderness. There is no guarding or rebound.  Musculoskeletal:        General: No deformity.     Cervical back: Neck supple.     Right lower leg: Edema (Trace) present.     Left lower leg: Edema (Trace) present.  Skin:    General: Skin is warm and dry.  Neurological:     General: No focal deficit present.     Mental Status: She is alert.     Comments: She is oriented to self and place.  Mildly more confused than her baseline. No pronator drift.  She is able to move all extremities spontaneously but is generally weak in all extremities.  No facial droop.    ED Results / Procedures / Treatments   Labs (all labs ordered are listed, but only abnormal results are displayed) Labs Reviewed  CBC WITH DIFFERENTIAL/PLATELET - Abnormal; Notable for the following components:      Result Value   WBC 13.2 (*)    Neutro Abs 9.4 (*)    Monocytes Absolute 1.1 (*)    All other components within normal limits  COMPREHENSIVE METABOLIC PANEL - Abnormal; Notable for the following components:   Sodium 128 (*)    Potassium 3.3 (*)    Chloride 81 (*)    Glucose, Bld 148 (*)    BUN 28 (*)    Creatinine, Ser 1.93 (*)    GFR, Estimated 26 (*)    All other components within normal limits  URINALYSIS, ROUTINE W REFLEX MICROSCOPIC  - Abnormal; Notable for the following components:   APPearance HAZY (*)    Leukocytes,Ua MODERATE (*)    WBC, UA >50 (*)    Bacteria, UA FEW (*)    All other components within normal limits  I-STAT VENOUS BLOOD GAS, ED - Abnormal; Notable for the following components:   pH, Ven 7.553 (*)    pO2, Ven 136 (*)    Bicarbonate 40.1 (*)    TCO2 41 (*)    Acid-Base Excess 16.0 (*)    Sodium 126 (*)    Potassium 3.0 (*)    Calcium, Ion 1.03 (*)    Hemoglobin 15.3 (*)    All other components within normal limits  URINE CULTURE  AMMONIA  TSH  BRAIN NATRIURETIC PEPTIDE  MAGNESIUM  COMPREHENSIVE METABOLIC PANEL  CBC  TROPONIN I (HIGH SENSITIVITY)  TROPONIN I (HIGH SENSITIVITY)    EKG EKG Interpretation  Date/Time:  Sunday Aug 16 2021 19:39:24 EDT Ventricular Rate:  83 PR Interval:  178 QRS Duration: 113 QT Interval:  402 QTC Calculation: 473 R Axis:   48 Text Interpretation: Sinus rhythm Borderline intraventricular conduction delay Confirmed by Octaviano Glow [id number]) on 08/16/2021 8:50:22 PM  Radiology CT Head Wo Contrast  Result Date: 08/16/2021 CLINICAL DATA:  Altered mental status. EXAM: CT HEAD WITHOUT CONTRAST TECHNIQUE: Contiguous axial images were obtained from the base of the skull through the vertex without intravenous contrast. RADIATION DOSE REDUCTION: This exam was performed according to the departmental dose-optimization program which includes automated exposure control, adjustment of the mA and/or kV according to patient size and/or use of iterative reconstruction technique. COMPARISON:  CT head  dated 11/26/2019. FINDINGS: Brain: No evidence of acute infarction, hemorrhage, hydrocephalus, extra-axial collection or mass lesion/mass effect. Periventricular white matter hypoattenuation likely represents chronic small vessel ischemic disease. Vascular: There are vascular calcifications in the carotid siphons. Skull: Normal. Negative for fracture or focal lesion.  Sinuses/Orbits: No acute finding. Other: None. IMPRESSION: No acute intracranial process. Electronically Signed   By: Zerita Boers M.D.   On: 08/16/2021 20:36   DG Chest Portable 1 View  Result Date: 08/16/2021 CLINICAL DATA:  Altered mental status. EXAM: PORTABLE CHEST 1 VIEW COMPARISON:  Chest radiograph dated 05/12/2021. FINDINGS: The patient is rotated to the right. The heart size and mediastinal contours are within normal limits. Vascular calcifications are seen in the aortic arch. both lungs are clear. The visualized skeletal structures are unremarkable. IMPRESSION: No active disease. Aortic Atherosclerosis (ICD10-I70.0). Electronically Signed   By: Zerita Boers M.D.   On: 08/16/2021 20:25    Procedures Procedures    Medications Ordered in ED Medications  potassium chloride 10 mEq in 100 mL IVPB (10 mEq Intravenous New Bag/Given 08/16/21 2344)  cefTRIAXone (ROCEPHIN) 1 g in sodium chloride 0.9 % 100 mL IVPB (has no administration in time range)  exemestane (AROMASIN) tablet 25 mg (has no administration in time range)  rosuvastatin (CRESTOR) tablet 20 mg (has no administration in time range)  pantoprazole (PROTONIX) EC tablet 40 mg (has no administration in time range)  pramipexole (MIRAPEX) tablet 0.5 mg (has no administration in time range)  enoxaparin (LOVENOX) injection 30 mg (has no administration in time range)  sodium chloride flush (NS) 0.9 % injection 3 mL (3 mLs Intravenous Given 08/16/21 2345)  0.9 %  sodium chloride infusion (has no administration in time range)  acetaminophen (TYLENOL) tablet 650 mg (has no administration in time range)    Or  acetaminophen (TYLENOL) suppository 650 mg (has no administration in time range)  polyethylene glycol (MIRALAX / GLYCOLAX) packet 17 g (has no administration in time range)  potassium chloride (KLOR-CON) packet 40 mEq (has no administration in time range)  sodium chloride 0.9 % bolus 500 mL (500 mLs Intravenous New Bag/Given 08/16/21  2343)    ED Course/ Medical Decision Making/ A&P Clinical Course as of 08/16/21 2348  Sun Aug 16, 2021  2056 81 yo female with multiple medical comorbidities presenting with unresponsiveness episode, witnessed by daughter who felt she "had no pulse" and performed CPR. [MT]    Clinical Course User Index [MT] Trifan, Carola Rhine, MD                           Medical Decision Making Amount and/or Complexity of Data Reviewed Labs: ordered. Radiology: ordered. ECG/medicine tests: ordered.  Risk Decision regarding hospitalization.   Hajer Leesha Veno is a 81 y.o. female with a history of CAD, HTN, dementia, CKD, prior breast cancer, CHF, DM, CVA presenting to the ED with an episode of altered mental status.  On exam, the patient is afebrile and hemodynamically stable.  She is tired appearing but alert and able to answer questions.  She does appear mildly confused.  Abdomen is soft and nontender in all quadrants.  She has no focal neurologic deficits.  Differential diagnosis includes stroke, seizure, electrolyte abnormality, ACS, syncope, hypotension, hypoglycemia.  ECG shows a sinus rhythm with a rate of 83 without acute ischemic changes or heart blocks.  Ammonia is normal.  TSH is within normal limits.  VBG without hypercapnia.  Patient is alkalotic with a  pH of 7.5.  BNP is not elevated.  CBC with mild leukocytosis to 13.2 with normal hemoglobin of 14.9.  CMP notable for hyponatremia with a sodium of 128 and hypokalemia of 3.3.  Her creatinine is elevated to 1.93 with a BUN of 28 which does appear slightly higher than her baseline.  Troponin is negative.  CT head does not show any acute intracranial abnormality.  Chest x-Mackel unremarkable including no pulmonary edema or focal consolidation/opacities.  UA is notable for moderate leukocytes and greater than 50 white blood cells concerning for UTI.  Patient given a dose of IV Rocephin.  Given the patient's multiple syncopal episodes that she  continues to not be at her baseline (remains extremely fatigued and generally weak, possibly due to UTI), will admit for further management.  Hospitalist team was contacted for admission the patient was admitted to their service in stable condition.        Final Clinical Impression(s) / ED Diagnoses Final diagnoses:  Altered mental status, unspecified altered mental status type    Rx / DC Orders ED Discharge Orders     None         Sondra Come, MD 08/16/21 2348    Wyvonnia Dusky, MD 08/16/21 [phone]

## 2021-08-17 ENCOUNTER — Encounter (HOSPITAL_COMMUNITY): Payer: Self-pay | Admitting: Internal Medicine

## 2021-08-17 DIAGNOSIS — E860 Dehydration: Secondary | ICD-10-CM | POA: Diagnosis present

## 2021-08-17 DIAGNOSIS — I2510 Atherosclerotic heart disease of native coronary artery without angina pectoris: Secondary | ICD-10-CM | POA: Diagnosis present

## 2021-08-17 DIAGNOSIS — M1A0720 Idiopathic chronic gout, left ankle and foot, without tophus (tophi): Secondary | ICD-10-CM | POA: Diagnosis present

## 2021-08-17 DIAGNOSIS — F322 Major depressive disorder, single episode, severe without psychotic features: Secondary | ICD-10-CM | POA: Diagnosis not present

## 2021-08-17 DIAGNOSIS — N179 Acute kidney failure, unspecified: Secondary | ICD-10-CM | POA: Diagnosis present

## 2021-08-17 DIAGNOSIS — I130 Hypertensive heart and chronic kidney disease with heart failure and stage 1 through stage 4 chronic kidney disease, or unspecified chronic kidney disease: Secondary | ICD-10-CM | POA: Diagnosis present

## 2021-08-17 DIAGNOSIS — G3184 Mild cognitive impairment, so stated: Secondary | ICD-10-CM | POA: Diagnosis not present

## 2021-08-17 DIAGNOSIS — E871 Hypo-osmolality and hyponatremia: Secondary | ICD-10-CM | POA: Diagnosis present

## 2021-08-17 DIAGNOSIS — R404 Transient alteration of awareness: Secondary | ICD-10-CM | POA: Diagnosis not present

## 2021-08-17 DIAGNOSIS — G9341 Metabolic encephalopathy: Secondary | ICD-10-CM | POA: Diagnosis present

## 2021-08-17 DIAGNOSIS — G2581 Restless legs syndrome: Secondary | ICD-10-CM | POA: Diagnosis present

## 2021-08-17 DIAGNOSIS — N390 Urinary tract infection, site not specified: Secondary | ICD-10-CM | POA: Diagnosis present

## 2021-08-17 DIAGNOSIS — Z7902 Long term (current) use of antithrombotics/antiplatelets: Secondary | ICD-10-CM | POA: Diagnosis not present

## 2021-08-17 DIAGNOSIS — F03A4 Unspecified dementia, mild, with anxiety: Secondary | ICD-10-CM | POA: Diagnosis present

## 2021-08-17 DIAGNOSIS — G4486 Cervicogenic headache: Secondary | ICD-10-CM | POA: Diagnosis not present

## 2021-08-17 DIAGNOSIS — J9811 Atelectasis: Secondary | ICD-10-CM | POA: Diagnosis not present

## 2021-08-17 DIAGNOSIS — N184 Chronic kidney disease, stage 4 (severe): Secondary | ICD-10-CM | POA: Diagnosis present

## 2021-08-17 DIAGNOSIS — G43109 Migraine with aura, not intractable, without status migrainosus: Secondary | ICD-10-CM | POA: Diagnosis not present

## 2021-08-17 DIAGNOSIS — R4182 Altered mental status, unspecified: Secondary | ICD-10-CM | POA: Diagnosis present

## 2021-08-17 DIAGNOSIS — Z8673 Personal history of transient ischemic attack (TIA), and cerebral infarction without residual deficits: Secondary | ICD-10-CM | POA: Diagnosis not present

## 2021-08-17 DIAGNOSIS — E1159 Type 2 diabetes mellitus with other circulatory complications: Secondary | ICD-10-CM | POA: Diagnosis not present

## 2021-08-17 DIAGNOSIS — E873 Alkalosis: Secondary | ICD-10-CM | POA: Diagnosis present

## 2021-08-17 DIAGNOSIS — J90 Pleural effusion, not elsewhere classified: Secondary | ICD-10-CM | POA: Diagnosis not present

## 2021-08-17 DIAGNOSIS — R918 Other nonspecific abnormal finding of lung field: Secondary | ICD-10-CM | POA: Diagnosis not present

## 2021-08-17 DIAGNOSIS — Z7901 Long term (current) use of anticoagulants: Secondary | ICD-10-CM | POA: Diagnosis not present

## 2021-08-17 DIAGNOSIS — Z794 Long term (current) use of insulin: Secondary | ICD-10-CM | POA: Diagnosis not present

## 2021-08-17 DIAGNOSIS — E669 Obesity, unspecified: Secondary | ICD-10-CM | POA: Diagnosis present

## 2021-08-17 DIAGNOSIS — R531 Weakness: Secondary | ICD-10-CM | POA: Diagnosis not present

## 2021-08-17 DIAGNOSIS — R55 Syncope and collapse: Secondary | ICD-10-CM | POA: Diagnosis not present

## 2021-08-17 DIAGNOSIS — E1122 Type 2 diabetes mellitus with diabetic chronic kidney disease: Secondary | ICD-10-CM | POA: Diagnosis present

## 2021-08-17 DIAGNOSIS — F419 Anxiety disorder, unspecified: Secondary | ICD-10-CM | POA: Diagnosis not present

## 2021-08-17 DIAGNOSIS — I5032 Chronic diastolic (congestive) heart failure: Secondary | ICD-10-CM | POA: Diagnosis present

## 2021-08-17 DIAGNOSIS — Z7984 Long term (current) use of oral hypoglycemic drugs: Secondary | ICD-10-CM | POA: Diagnosis not present

## 2021-08-17 DIAGNOSIS — B0229 Other postherpetic nervous system involvement: Secondary | ICD-10-CM | POA: Diagnosis present

## 2021-08-17 DIAGNOSIS — Z853 Personal history of malignant neoplasm of breast: Secondary | ICD-10-CM | POA: Diagnosis not present

## 2021-08-17 DIAGNOSIS — Z9861 Coronary angioplasty status: Secondary | ICD-10-CM | POA: Diagnosis not present

## 2021-08-17 DIAGNOSIS — B952 Enterococcus as the cause of diseases classified elsewhere: Secondary | ICD-10-CM | POA: Diagnosis present

## 2021-08-17 DIAGNOSIS — D649 Anemia, unspecified: Secondary | ICD-10-CM | POA: Diagnosis not present

## 2021-08-17 DIAGNOSIS — E11610 Type 2 diabetes mellitus with diabetic neuropathic arthropathy: Secondary | ICD-10-CM | POA: Diagnosis present

## 2021-08-17 LAB — CBG MONITORING, ED
Glucose-Capillary: 230 mg/dL — ABNORMAL HIGH (ref 70–99)
Glucose-Capillary: 197 mg/dL — ABNORMAL HIGH (ref 70–99)
Glucose-Capillary: 186 mg/dL — ABNORMAL HIGH (ref 70–99)

## 2021-08-17 LAB — GLUCOSE, CAPILLARY
Glucose-Capillary: 219 mg/dL — ABNORMAL HIGH (ref 70–99)
Glucose-Capillary: 201 mg/dL — ABNORMAL HIGH (ref 70–99)
Glucose-Capillary: 172 mg/dL — ABNORMAL HIGH (ref 70–99)

## 2021-08-17 LAB — CBC
HCT: 36.7 % (ref 36.0–46.0)
Hemoglobin: 12.6 g/dL (ref 12.0–15.0)
MCH: 30.4 pg (ref 26.0–34.0)
MCHC: 34.3 g/dL (ref 30.0–36.0)
MCV: 88.4 fL (ref 80.0–100.0)
Platelets: 188 10*3/uL (ref 150–400)
RBC: 4.15 MIL/uL (ref 3.87–5.11)
RDW: 13.6 % (ref 11.5–15.5)
WBC: 10.4 10*3/uL (ref 4.0–10.5)
nRBC: 0 % (ref 0.0–0.2)

## 2021-08-17 LAB — COMPREHENSIVE METABOLIC PANEL
ALT: 23 U/L (ref 0–44)
AST: 25 U/L (ref 15–41)
Albumin: 3.1 g/dL — ABNORMAL LOW (ref 3.5–5.0)
Alkaline Phosphatase: 62 U/L (ref 38–126)
Anion gap: 10 (ref 5–15)
BUN: 30 mg/dL — ABNORMAL HIGH (ref 8–23)
CO2: 31 mmol/L (ref 22–32)
Calcium: 9.1 mg/dL (ref 8.9–10.3)
Chloride: 88 mmol/L — ABNORMAL LOW (ref 98–111)
Creatinine, Ser: 1.96 mg/dL — ABNORMAL HIGH (ref 0.44–1.00)
GFR, Estimated: 25 mL/min — ABNORMAL LOW (ref >60)
Glucose, Bld: 208 mg/dL — ABNORMAL HIGH (ref 70–99)
Potassium: 3.6 mmol/L (ref 3.5–5.1)
Sodium: 129 mmol/L — ABNORMAL LOW (ref 135–145)
Total Bilirubin: 0.6 mg/dL (ref 0.3–1.2)
Total Protein: 6.2 g/dL — ABNORMAL LOW (ref 6.5–8.1)

## 2021-08-17 LAB — TROPONIN I (HIGH SENSITIVITY): Troponin I (High Sensitivity): 10 ng/L (ref <18)

## 2021-08-17 LAB — MAGNESIUM: Magnesium: 2.1 mg/dL (ref 1.7–2.4)

## 2021-08-17 MED ORDER — GABAPENTIN 300 MG PO CAPS
300.0000 mg | ORAL_CAPSULE | Freq: Every day | ORAL | Status: DC
  Administered 2021-08-17 – 2021-08-19 (×3): 300 mg via ORAL
  Filled 2021-08-17 (×3): qty 1

## 2021-08-17 MED ORDER — APIXABAN 5 MG PO TABS
5.0000 mg | ORAL_TABLET | Freq: Two times a day (BID) | ORAL | Status: DC
  Administered 2021-08-17 – 2021-08-20 (×8): 5 mg via ORAL
  Filled 2021-08-17 (×8): qty 1

## 2021-08-17 MED ORDER — PRAMIPEXOLE DIHYDROCHLORIDE 0.25 MG PO TABS
0.2500 mg | ORAL_TABLET | Freq: Every day | ORAL | Status: DC
  Administered 2021-08-17 – 2021-08-19 (×3): 0.25 mg via ORAL
  Filled 2021-08-17 (×3): qty 1

## 2021-08-17 MED ORDER — METOPROLOL SUCCINATE ER 25 MG PO TB24
25.0000 mg | ORAL_TABLET | Freq: Every day | ORAL | Status: DC
  Administered 2021-08-17 – 2021-08-19 (×4): 25 mg via ORAL
  Filled 2021-08-17 (×4): qty 1

## 2021-08-17 MED ORDER — INSULIN ASPART 100 UNIT/ML IJ SOLN
0.0000 [IU] | Freq: Three times a day (TID) | INTRAMUSCULAR | Status: DC
  Administered 2021-08-17: 2 [IU] via SUBCUTANEOUS
  Administered 2021-08-17: 3 [IU] via SUBCUTANEOUS
  Administered 2021-08-17 – 2021-08-18 (×4): 2 [IU] via SUBCUTANEOUS
  Administered 2021-08-19: 3 [IU] via SUBCUTANEOUS
  Administered 2021-08-19: 2 [IU] via SUBCUTANEOUS
  Administered 2021-08-19 – 2021-08-20 (×2): 3 [IU] via SUBCUTANEOUS

## 2021-08-17 MED ORDER — FEBUXOSTAT 40 MG PO TABS
40.0000 mg | ORAL_TABLET | Freq: Every day | ORAL | Status: DC
  Administered 2021-08-17 – 2021-08-20 (×4): 40 mg via ORAL
  Filled 2021-08-17 (×4): qty 1

## 2021-08-17 MED ORDER — ALPRAZOLAM 0.25 MG PO TABS
0.2500 mg | ORAL_TABLET | Freq: Two times a day (BID) | ORAL | Status: DC | PRN
  Administered 2021-08-17 – 2021-08-19 (×3): 0.25 mg via ORAL
  Filled 2021-08-17: qty 1
  Filled 2021-08-17: qty 2
  Filled 2021-08-17: qty 1

## 2021-08-17 MED ORDER — INSULIN GLARGINE-YFGN 100 UNIT/ML ~~LOC~~ SOLN
5.0000 [IU] | Freq: Every day | SUBCUTANEOUS | Status: DC
  Administered 2021-08-17 – 2021-08-18 (×2): 5 [IU] via SUBCUTANEOUS
  Filled 2021-08-17 (×3): qty 0.05

## 2021-08-17 MED ORDER — MIRTAZAPINE 15 MG PO TABS
15.0000 mg | ORAL_TABLET | Freq: Every day | ORAL | Status: DC
  Administered 2021-08-17 – 2021-08-19 (×3): 15 mg via ORAL
  Filled 2021-08-17 (×5): qty 1

## 2021-08-17 MED ORDER — SODIUM CHLORIDE 0.9 % IV SOLN
1.0000 g | INTRAVENOUS | Status: DC
  Administered 2021-08-17: 1 g via INTRAVENOUS
  Filled 2021-08-17: qty 10

## 2021-08-17 MED ORDER — SODIUM CHLORIDE 0.9 % IV SOLN: INTRAVENOUS | Status: AC

## 2021-08-17 NOTE — ED Notes (Signed)
PT working with patient.

## 2021-08-17 NOTE — Evaluation (Signed)
Occupational Therapy Evaluation Patient Details Name: [name] MRN: [id number] DOB: 1940/04/14 Today's Date: 08/17/2021   History of Present Illness 81 y/o female admitted secondary to [place]. CT neg for acute change. PMH includes CAD s/p stent, HTN, breast cancer, CVA, dementia, gout, DM.   Clinical Impression   PTA, pt was living with her husband and required assistance with ADLs from family; uses scooter for mobility and RW for short distance ambulation. Pt currently requiring Min A for UB ADLs, Mod-Max A for LB ADLs, and Min Guard A for functional transfers with RW. Daughter reporting pt required PWB through heel only due to deformity; able to maintain PWB during tranfers with Min Guard A. VSS on RA. Pt would benefit from further acute OT to facilitate safe dc. Recommend dc to home with HHOT for further OT to optimize safety, independence with ADLs, and return to PLOF.      Recommendations for follow up therapy are one component of a multi-disciplinary discharge planning process, led by the attending physician.  Recommendations may be updated based on patient status, additional functional criteria and insurance authorization.   Follow Up Recommendations  Home health OT    Assistance Recommended at Discharge Frequent or constant Supervision/Assistance  Patient can return home with the following A little help with walking and/or transfers;A little help with bathing/dressing/bathroom    Functional Status Assessment  Patient has had a recent decline in their functional status and demonstrates the ability to make significant improvements in function in a reasonable and predictable amount of time.  Equipment Recommendations  None recommended by OT    Recommendations for Other Services       Precautions / Restrictions Precautions Precautions: Fall Restrictions Weight Bearing Restrictions: Yes RLE Weight Bearing: Partial weight bearing RLE Partial Weight Bearing Percentage or  Pounds: heel weightbearing secondary to foot deformity      Mobility Bed Mobility Overal bed mobility: Needs Assistance Bed Mobility: Supine to Sit     Supine to sit: Min guard     General bed mobility comments: Min Guard A for safety    Transfers Overall transfer level: Needs assistance Equipment used: Rolling walker (2 wheels) Transfers: Sit to/from Stand, Bed to chair/wheelchair/BSC Sit to Stand: Min guard     Step pivot transfers: Min guard     General transfer comment: Min guard for safety. Increased time to come to standing.      Balance Overall balance assessment: Needs assistance Sitting-balance support: No upper extremity supported, Feet supported Sitting balance-Leahy Scale: Good     Standing balance support: Bilateral upper extremity supported Standing balance-Leahy Scale: Poor Standing balance comment: Reliant on BUE support                           ADL either performed or assessed with clinical judgement   ADL Overall ADL's : Needs assistance/impaired Eating/Feeding: Set up;Sitting   Grooming: Supervision/safety;Set up;Sitting   Upper Body Bathing: Minimal assistance;Sitting   Lower Body Bathing: Moderate assistance;Sit to/from stand   Upper Body Dressing : Minimal assistance;Sitting   Lower Body Dressing: Maximal assistance;Sit to/from stand   Toilet Transfer: Min guard;Stand-pivot;Rolling walker (2 wheels) (simulated to recliner) Armed forces technical officer Details (indicate cue type and reason): Min Guard A for safety Toileting- Clothing Manipulation and Hygiene: Minimal assistance;Sit to/from stand       Functional mobility during ADLs: Min guard (stand pivot/ side steps) General ADL Comments: Pt presenting with generalized weakness and decreased balance with  maintaining hell only WB. Present close to baseline but reporting she feels more "shakey".     Vision         Perception     Praxis      Pertinent Vitals/Pain Pain  Assessment Pain Assessment: No/denies pain     Hand Dominance Right   Extremity/Trunk Assessment Upper Extremity Assessment Upper Extremity Assessment: Overall WFL for tasks assessed   Lower Extremity Assessment Lower Extremity Assessment: Defer to PT evaluation;RLE deficits/detail RLE Deficits / Details: R foot deformity at midfoot at baseline   Cervical / Trunk Assessment Cervical / Trunk Assessment: Normal   Communication Communication Communication: No difficulties   Cognition Arousal/Alertness: Awake/alert Behavior During Therapy: WFL for tasks assessed/performed Overall Cognitive Status: History of cognitive impairments - at baseline                                 General Comments: Dementia at baseline. Following all commands and participating in conversation.     General Comments  Daughter present throughout. HR 80-90s. SpO2 98% on RA. BP supine 114/66 and sitting in recliner 134/72    Exercises     Shoulder Instructions      Home Living Family/patient expects to be discharged to:: Private residence Living Arrangements: Spouse/significant other Available Help at Discharge: Family;Available 24 hours/day Type of Home: House Home Access: Ramped entrance     Home Layout: One level     Bathroom Shower/Tub: Occupational psychologist: Handicapped height     Home Equipment: Conservation officer, nature (2 wheels);BSC/3in1;Wheelchair - Press photographer;Shower seat - built in          Prior Functioning/Environment Prior Level of Function : Needs assist             Mobility Comments: Uses scooter, but does ambulate short distances with RW ADLs Comments: Needs assist with ADLs from family        OT Problem List: Decreased range of motion;Decreased strength;Decreased activity tolerance;Impaired balance (sitting and/or standing);Decreased knowledge of use of DME or AE;Decreased knowledge of precautions      OT Treatment/Interventions:  Self-care/ADL training;Therapeutic exercise;Energy conservation;DME and/or AE instruction;Therapeutic activities;Patient/family education    OT Goals(Current goals can be found in the care plan section) Acute Rehab OT Goals Patient Stated Goal: Go home OT Goal Formulation: With patient/family Time For Goal Achievement: 08/31/21 Potential to Achieve Goals: Good  OT Frequency: Min 2X/week    Co-evaluation              AM-PAC OT "6 Clicks" Daily Activity     Outcome Measure Help from another person eating meals?: None Help from another person taking care of personal grooming?: A Little Help from another person toileting, which includes using toliet, bedpan, or urinal?: A Little Help from another person bathing (including washing, rinsing, drying)?: A Lot Help from another person to put on and taking off regular upper body clothing?: A Little Help from another person to put on and taking off regular lower body clothing?: A Lot 6 Click Score: 17   End of Session Equipment Utilized During Treatment: Rolling walker (2 wheels);Gait belt Nurse Communication: Mobility status  Activity Tolerance: Patient tolerated treatment well Patient left: in chair;with call bell/phone within reach;with family/visitor present  OT Visit Diagnosis: Unsteadiness on feet (R26.81);Other abnormalities of gait and mobility (R26.89);Muscle weakness (generalized) (M62.81)                Time:  0831-0859 OT Time Calculation (min): 28 min Charges:  OT General Charges $OT Visit: 1 Visit OT Evaluation $OT Eval Moderate Complexity: 1 Mod OT Treatments $Self Care/Home Management : 8-22 mins  Loral Campi MSOT, OTR/L Acute Rehab Pager: [phone] Office: [place] 08/17/2021, 11:19 AM

## 2021-08-17 NOTE — Evaluation (Signed)
Physical Therapy Evaluation Patient Details Name: [name] MRN: [id number] DOB: Aug 01, 1940 Today's Date: 08/17/2021  History of Present Illness  Pt is an 81 y/o female admitted secondary to AMS. PMH includes CAD s/p stent, HTN, breast cancer, CVA, dementia, gout, DM.  Clinical Impression  Pt admitted secondary to problem above with deficits below. Pt requiring min guard to min A for short distance ambulation this session using RW. Mildly shaky and with increased fatigue, so distance limited. Pt and pt's daughter reports she normally only ambulates very short distances secondary to R foot deformity and uses scooter for remainder of mobility. Feel pt would benefit from HHPT at d/c. Will continue to follow acutely.        Recommendations for follow up therapy are one component of a multi-disciplinary discharge planning process, led by the attending physician.  Recommendations may be updated based on patient status, additional functional criteria and insurance authorization.  Follow Up Recommendations Home health PT    Assistance Recommended at Discharge Frequent or constant Supervision/Assistance  Patient can return home with the following  A little help with walking and/or transfers;A little help with bathing/dressing/bathroom;Assistance with cooking/housework;Help with stairs or ramp for entrance;Assist for transportation    Equipment Recommendations None recommended by PT  Recommendations for Other Services       Functional Status Assessment Patient has had a recent decline in their functional status and demonstrates the ability to make significant improvements in function in a reasonable and predictable amount of time.     Precautions / Restrictions Precautions Precautions: Fall Restrictions Weight Bearing Restrictions: Yes RLE Weight Bearing:  (heel weightbearing secondary to foot deformity)      Mobility  Bed Mobility Overal bed mobility: Needs Assistance              General bed mobility comments: In recliner upon entry    Transfers Overall transfer level: Needs assistance Equipment used: Rolling walker (2 wheels) Transfers: Sit to/from Stand Sit to Stand: Min guard           General transfer comment: Min guard for safety. Increased time to come to standing.    Ambulation/Gait Ambulation/Gait assistance: Min guard, Min assist Gait Distance (Feet): 20 Feet Assistive device: Rolling walker (2 wheels) Gait Pattern/deviations: Step-to pattern, Decreased step length - right, Decreased step length - left, Decreased weight shift to right Gait velocity: Decreased     General Gait Details: Cues to maintain heel weightbearing on RLE. Min guard to min A for steadying as pt shaky throughout.  Stairs            Wheelchair Mobility    Modified Rankin (Stroke Patients Only)       Balance Overall balance assessment: Needs assistance Sitting-balance support: No upper extremity supported, Feet supported Sitting balance-Leahy Scale: Good     Standing balance support: Bilateral upper extremity supported Standing balance-Leahy Scale: Poor Standing balance comment: Reliant on BUE support                             Pertinent Vitals/Pain Pain Assessment Pain Assessment: No/denies pain    Home Living Family/patient expects to be discharged to:: Private residence Living Arrangements: Spouse/significant other Available Help at Discharge: Family;Available 24 hours/day Type of Home: House Home Access: Ramped entrance       Home Layout: One level Home Equipment: Conservation officer, nature (2 wheels);BSC/3in1;Wheelchair - Press photographer;Shower seat - built in      Prior Function  Prior Level of Function : Needs assist             Mobility Comments: Uses scooter, but does ambulate short distances with RW ADLs Comments: Needs assist with ADLs.     Hand Dominance   Dominant Hand: Right    Extremity/Trunk Assessment    Upper Extremity Assessment Upper Extremity Assessment: Defer to OT evaluation    Lower Extremity Assessment Lower Extremity Assessment: RLE deficits/detail RLE Deficits / Details: R foot deformity at midfoot at baseline    Cervical / Trunk Assessment Cervical / Trunk Assessment: Normal  Communication   Communication: No difficulties  Cognition Arousal/Alertness: Awake/alert Behavior During Therapy: WFL for tasks assessed/performed Overall Cognitive Status: History of cognitive impairments - at baseline                                          General Comments General comments (skin integrity, edema, etc.): 114/66 and 134/72    Exercises     Assessment/Plan    PT Assessment Patient needs continued PT services  PT Problem List Decreased strength;Decreased activity tolerance;Decreased balance;Decreased mobility;Decreased knowledge of use of DME;Decreased cognition;Decreased safety awareness;Decreased knowledge of precautions       PT Treatment Interventions DME instruction;Gait training;Functional mobility training;Therapeutic activities;Therapeutic exercise;Stair training;Balance training;Patient/family education;Wheelchair mobility training    PT Goals (Current goals can be found in the Care Plan section)  Acute Rehab PT Goals Patient Stated Goal: to go home PT Goal Formulation: With patient Time For Goal Achievement: 08/31/21 Potential to Achieve Goals: Good    Frequency Min 3X/week     Co-evaluation               AM-PAC PT "6 Clicks" Mobility  Outcome Measure Help needed turning from your back to your side while in a flat bed without using bedrails?: None Help needed moving from lying on your back to sitting on the side of a flat bed without using bedrails?: A Little Help needed moving to and from a bed to a chair (including a wheelchair)?: A Little Help needed standing up from a chair using your arms (e.g., wheelchair or bedside chair)?:  A Little Help needed to walk in hospital room?: A Little Help needed climbing 3-5 steps with a railing? : A Lot 6 Click Score: 18    End of Session Equipment Utilized During Treatment: Gait belt Activity Tolerance: Patient limited by fatigue Patient left: in chair;with call bell/phone within reach;with family/visitor present (in recliner in ED) Nurse Communication: Mobility status PT Visit Diagnosis: Unsteadiness on feet (R26.81);Muscle weakness (generalized) (M62.81);Difficulty in walking, not elsewhere classified (R26.2)    Time: 1937-9024 PT Time Calculation (min) (ACUTE ONLY): 20 min   Charges:   PT Evaluation $PT Eval Moderate Complexity: 1 Mod          Reuel Derby, PT, DPT  Acute Rehabilitation Services  Pager: [phone] Office: [phone]   Rudean Hitt 08/17/2021, 10:56 AM

## 2021-08-17 NOTE — ED Notes (Signed)
Assumed care of patient. Patient is alert and oriented and calm. Patient has 2L O2 via [place] in place. Patient is alert and oriented and calm. Patient reports no blood draws/blood pressures can be taken from left arm - limb restriction arm band in place. Patient daughter remains at bedside. Patient provided warm blanket. Call bell in reach.

## 2021-08-17 NOTE — Progress Notes (Addendum)
PT adm to room 39M-04.  Purwick applied per request from the daughter and pt.  Tele box #13 applied and called tele monitor to make aware and have pt monitored.  Pt noted with a right charcot foot.  Area of the wound was cleaned and foam drsessing applied to the area on the side of the right foot.  Pt knows no weight bearing on that foot but use her heels.  Foam dressing applied to the sacrum per protocol.   Left arm restriction due to lymph node removal.  Dsg on the IV RFA dressing changed due to blood noted at insertion site.

## 2021-08-17 NOTE — Progress Notes (Signed)
[facility]                                                                               [name], is a 81 y.o. female, DOB - 03-Nov-1940, [id number] Admit date - 08/16/2021    Outpatient Primary MD for the patient is Kristen Mile, NP  LOS - 0  days    Brief summary   [name] is a 81 y.o. female with medical history significant of mild cognitive impairment/dementia, CAD, hypertension, hyperlipidemia, CKD 4, CHF, migraines, anxiety, GERD, RLS, diabetes, gout, CVA, DVT/PE, breast cancer presenting after episode of altered mental status and weakness.  Chest x-[place] showed no acute [name].  CT head showed no acute abnormality. She was found to have hyponatremia, hypokalemic and possible UTI.    Assessment & Plan    Assessment and Plan:  Acute metabolic encephalopathy:  Probably secondary to UTI,  AKI vs hyponatremia and dehydration.  Urine cultures ordered. Pt started on IV rocephin.  Pt is alert and oriented to person and place, but reports feeling tired and weak.  CT head without any acute abnormality.  CXR is negative for pneumonia.     AKI  on stage 4 CKD with hyponatremia and hypokalemia: Baseline creatinine around 1.4, admitted with a creatinine of 1.9.  Gently hydrate and repeat renal parameters in am.     Hyponatremia:  Suspect from dehydration vs from bumex.  Recheck sodium in am.     Hypokalemia:  Replaced.     Chronic diastolic heart failure Appears compensated.  Holding home dose of Bumex and metolazone.     Hyperlipidemia:  Continue with rosuvastatin.    Hypertension:  BP parameters are otimal.     Anxiety:  Resume home meds.    GERD:  ON PPI.     RLS:  Continue with pramipexole.     Type 2 DM; uncontrolled with hyperglycemia with diabetic neuropathy.  Insulin dependent.  CBG (last 3)  Recent Labs    08/17/21 1214 08/17/21 1456 08/17/21 1632  GLUCAP 186* 219* 201*   CONTINUE with SSI and  restart Semglee 5 units daily.  Get hemoglobin A!C   H/O of CVA  On eliquis and crestor.    H/o PE and DVT On eliquis.        Estimated body mass index is 27.25 kg/m as calculated from the following:   Height as of this encounter: '5\' 7"'$  (1.702 m).   Weight as of this encounter: 78.9 kg.  Code Status: FULL CODE DVT Prophylaxis:   apixaban (ELIQUIS) tablet 5 mg   Level of Care: Level of care: Telemetry Medical Family Communication: family at bedside.   Disposition Plan:     Remains inpatient appropriate:  IV fluids and IV antibiotics.   Procedures:  CT head.   Consultants:   None.   Antimicrobials:   Anti-infectives (From admission, onward)    Start     Dose/Rate Route Frequency Ordered Stop   08/17/21 2200  cefTRIAXone (ROCEPHIN) 1 g in sodium chloride 0.9 % 100 mL IVPB        1 g 200 mL/hr over 30 Minutes Intravenous Every 24 hours 08/17/21 0410  08/16/21 2230  cefTRIAXone (ROCEPHIN) 1 g in sodium chloride 0.9 % 100 mL IVPB        1 g 200 mL/hr over 30 Minutes Intravenous  Once 08/16/21 2227 08/17/21 0059        Medications  Scheduled Meds:  apixaban  5 mg Oral BID   exemestane  25 mg Oral QPC breakfast   febuxostat  40 mg Oral Daily   gabapentin  300 mg Oral QHS   insulin aspart  0-9 Units Subcutaneous TID WC   metoprolol succinate  25 mg Oral QHS   mirtazapine  15 mg Oral QHS   pantoprazole  40 mg Oral BID AC   pramipexole  0.25 mg Oral QHS   rosuvastatin  20 mg Oral Daily   sodium chloride flush  3 mL Intravenous Q12H   Continuous Infusions:  cefTRIAXone (ROCEPHIN)  IV     PRN Meds:.acetaminophen **OR** acetaminophen, ALPRAZolam, polyethylene glycol    Subjective:   [name] was seen and examined today.  Feel weak, worked with PT.  Objective:   Vitals:   08/17/21 1000 08/17/21 1045 08/17/21 1130 08/17/21 1215  BP: 106/62 103/65 114/68 106/65  Pulse: 64 63 (!) 58 60  Resp: (!) 25 (!) 23 (!) 25 17  Temp:      TempSrc:       SpO2: 98% 95% 96% 98%  Weight:      Height:        Intake/Output Summary (Last 24 hours) at 08/17/2021 1355 Last data filed at 08/16/2021 2210 Gross per 24 hour  Intake --  Output 450 ml  Net -450 ml   Filed Weights   08/17/21 0749  Weight: 78.9 kg     Exam General: Alert and oriented x 3, NAD Cardiovascular: S1 S2 auscultated, no murmurs, RRR Respiratory: Clear to auscultation bilaterally, no wheezing, rales or rhonchi Gastrointestinal: Soft, nontender, nondistended, + bowel sounds Ext: no pedal edema bilaterally Neuro: AAOx3, Cr N's II- XII. Strength 5/5 upper and lower extremities bilaterally Skin: No rashes Psych: Normal affect and demeanor, alert and oriented x3    Data Reviewed:  I have personally reviewed following labs and imaging studies   CBC Lab Results  Component Value Date   WBC 10.4 08/17/2021   RBC 4.15 08/17/2021   HGB 12.6 08/17/2021   HCT 36.7 08/17/2021   MCV 88.4 08/17/2021   MCH 30.4 08/17/2021   PLT 188 08/17/2021   MCHC 34.3 08/17/2021   RDW 13.6 08/17/2021   LYMPHSABS 2.4 08/16/2021   MONOABS 1.1 (H) 08/16/2021   EOSABS 0.1 08/16/2021   BASOSABS 0.1 74/10/1446     Last metabolic panel Lab Results  Component Value Date   NA 129 (L) 08/17/2021   K 3.6 08/17/2021   CL 88 (L) 08/17/2021   CO2 31 08/17/2021   BUN 30 (H) 08/17/2021   CREATININE 1.96 (H) 08/17/2021   GLUCOSE 208 (H) 08/17/2021   GFRNONAA 25 (L) 08/17/2021   GFRAA 34 (L) 12/05/2019   CALCIUM 9.1 08/17/2021   PHOS 2.9 02/23/2020   PROT 6.2 (L) 08/17/2021   ALBUMIN 3.1 (L) 08/17/2021   LABGLOB 2.4 08/01/2017   AGRATIO 2.0 08/01/2017   BILITOT 0.6 08/17/2021   ALKPHOS 62 08/17/2021   AST 25 08/17/2021   ALT 23 08/17/2021   ANIONGAP 10 08/17/2021    CBG (last 3)  Recent Labs    08/17/21 0119 08/17/21 0758 08/17/21 1214  GLUCAP 230* 197* 186*      Coagulation Profile:  No results for input(s): INR, PROTIME in the last 168 hours.   Radiology  Studies: CT Head Wo Contrast  Result Date: 08/16/2021 CLINICAL DATA:  Altered mental status. EXAM: CT HEAD WITHOUT CONTRAST TECHNIQUE: Contiguous axial images were obtained from the base of the skull through the vertex without intravenous contrast. RADIATION DOSE REDUCTION: This exam was performed according to the departmental dose-optimization program which includes automated exposure control, adjustment of the mA and/or kV according to patient size and/or use of iterative reconstruction technique. COMPARISON:  CT head dated 11/26/2019. FINDINGS: Brain: No evidence of acute infarction, hemorrhage, hydrocephalus, extra-axial collection or mass lesion/mass effect. Periventricular white matter hypoattenuation likely represents chronic small vessel ischemic disease. Vascular: There are vascular calcifications in the carotid siphons. Skull: Normal. Negative for fracture or focal lesion. Sinuses/Orbits: No acute finding. Other: None. IMPRESSION: No acute intracranial process. Electronically Signed   By: Zerita Boers M.D.   On: 08/16/2021 20:36   DG Chest Portable 1 View  Result Date: 08/16/2021 CLINICAL DATA:  Altered mental status. EXAM: PORTABLE CHEST 1 VIEW COMPARISON:  Chest radiograph dated 05/12/2021. FINDINGS: The patient is rotated to the right. The heart size and mediastinal contours are within normal limits. Vascular calcifications are seen in the aortic arch. both lungs are clear. The visualized skeletal structures are unremarkable. IMPRESSION: No active disease. Aortic Atherosclerosis (ICD10-I70.0). Electronically Signed   By: Zerita Boers M.D.   On: 08/16/2021 20:25       Hosie Poisson M.D. [facility] 08/17/2021, 1:55 PM  Available via Epic secure chat 7am-7pm After 7 pm, please refer to night coverage provider listed on amion.

## 2021-08-18 ENCOUNTER — Inpatient Hospital Stay (HOSPITAL_COMMUNITY): Payer: Medicare Other

## 2021-08-18 DIAGNOSIS — N179 Acute kidney failure, unspecified: Secondary | ICD-10-CM | POA: Diagnosis not present

## 2021-08-18 DIAGNOSIS — Z8673 Personal history of transient ischemic attack (TIA), and cerebral infarction without residual deficits: Secondary | ICD-10-CM | POA: Diagnosis not present

## 2021-08-18 DIAGNOSIS — G3184 Mild cognitive impairment, so stated: Secondary | ICD-10-CM | POA: Diagnosis not present

## 2021-08-18 DIAGNOSIS — G9341 Metabolic encephalopathy: Secondary | ICD-10-CM | POA: Diagnosis not present

## 2021-08-18 LAB — CBC WITH DIFFERENTIAL/PLATELET
Abs Immature Granulocytes: 0.03 10*3/uL (ref 0.00–0.07)
Basophils Absolute: 0.1 10*3/uL (ref 0.0–0.1)
Basophils Relative: 1 %
Eosinophils Absolute: 0.2 10*3/uL (ref 0.0–0.5)
Eosinophils Relative: 3 %
HCT: 36.1 % (ref 36.0–46.0)
Hemoglobin: 12 g/dL (ref 12.0–15.0)
Immature Granulocytes: 0 %
Lymphocytes Relative: 24 %
Lymphs Abs: 2 10*3/uL (ref 0.7–4.0)
MCH: 29.7 pg (ref 26.0–34.0)
MCHC: 33.2 g/dL (ref 30.0–36.0)
MCV: 89.4 fL (ref 80.0–100.0)
Monocytes Absolute: 0.6 10*3/uL (ref 0.1–1.0)
Monocytes Relative: 8 %
Neutro Abs: 5.2 10*3/uL (ref 1.7–7.7)
Neutrophils Relative %: 64 %
Platelets: 163 10*3/uL (ref 150–400)
RBC: 4.04 MIL/uL (ref 3.87–5.11)
RDW: 13.5 % (ref 11.5–15.5)
WBC: 8.1 10*3/uL (ref 4.0–10.5)
nRBC: 0 % (ref 0.0–0.2)

## 2021-08-18 LAB — GLUCOSE, CAPILLARY
Glucose-Capillary: 171 mg/dL — ABNORMAL HIGH (ref 70–99)
Glucose-Capillary: 163 mg/dL — ABNORMAL HIGH (ref 70–99)
Glucose-Capillary: 224 mg/dL — ABNORMAL HIGH (ref 70–99)
Glucose-Capillary: 216 mg/dL — ABNORMAL HIGH (ref 70–99)

## 2021-08-18 LAB — HEMOGLOBIN A1C
Hgb A1c MFr Bld: 9.3 % — ABNORMAL HIGH (ref 4.8–5.6)
Mean Plasma Glucose: 220.21 mg/dL

## 2021-08-18 LAB — BASIC METABOLIC PANEL
Anion gap: 11 (ref 5–15)
BUN: 23 mg/dL (ref 8–23)
CO2: 27 mmol/L (ref 22–32)
Calcium: 9 mg/dL (ref 8.9–10.3)
Chloride: 92 mmol/L — ABNORMAL LOW (ref 98–111)
Creatinine, Ser: 1.42 mg/dL — ABNORMAL HIGH (ref 0.44–1.00)
GFR, Estimated: 37 mL/min — ABNORMAL LOW (ref >60)
Glucose, Bld: 184 mg/dL — ABNORMAL HIGH (ref 70–99)
Potassium: 3.6 mmol/L (ref 3.5–5.1)
Sodium: 130 mmol/L — ABNORMAL LOW (ref 135–145)

## 2021-08-18 MED ORDER — SODIUM CHLORIDE 0.9 % IV SOLN
1.5000 g | Freq: Two times a day (BID) | INTRAVENOUS | Status: DC
  Administered 2021-08-18 – 2021-08-20 (×4): 1.5 g via INTRAVENOUS
  Filled 2021-08-18 (×5): qty 4
  Filled 2021-08-18: qty 1.5

## 2021-08-18 NOTE — Progress Notes (Signed)
[facility]                                                                               [name], is a 81 y.o. female, DOB - 1940/08/02, [id number] Admit date - 08/16/2021    Outpatient Primary MD for the patient is Kristen Mile, NP  LOS - 1  days    Brief summary   [name] is a 81 y.o. female with medical history significant of mild cognitive impairment/dementia, CAD, hypertension, hyperlipidemia, CKD 4, CHF, migraines, anxiety, GERD, RLS, diabetes, gout, CVA, DVT/PE, breast cancer presenting after episode of altered mental status and weakness.  Chest x-[place] showed no acute abnormality.  CT head showed no acute abnormality. She was found to have hyponatremia, hypokalemic , AKI, and possible UTI. She was started on IV rocephin, with pending urine cultures. She appears to be back to baseline mental status.    Assessment & Plan    Assessment and Plan:  Acute metabolic encephalopathy:  Probably secondary to UTI,  AKI vs hyponatremia and dehydration.  Urine cultures ordered. Pt started on IV rocephin.  Pt is alert and oriented to person and place, but reports feeling tired and weak.  CT head without any acute abnormality.  CXR is negative for pneumonia.  Follow up urine culture report.     AKI  on stage 4 CKD with hyponatremia and hypokalemia: Baseline creatinine around 1.4, admitted with a creatinine of 1.9, back to creatinine of 1.4 with hydration. Hold the fluids today and recheck renal parameters in am.      Hyponatremia:  Suspect from dehydration vs from bumex.  Improved to 130 with hydration.  Continue to monitor.     Hypokalemia:  Replaced.     Chronic diastolic heart failure Appears compensated.  Holding home dose of Bumex and metolazone.  Plan to restart them once the renal parameters are back to baseline.     Hyperlipidemia:  Continue with rosuvastatin.    Hypertension:  BP parameters are well controlled. Currently on  Toprol XL 25 mg daily.    Anxiety:  Resume home meds.    GERD:  ON PPI.     RLS:  Continue with pramipexole. Decreased the dose due to AKI.    Right Breast cancer diagnosed in 2018  ER positive. Follows up with Dr Burr Medico.  Bilateral beast lumpectomy followed by radiation therapy and currently on Aromasin.  She reports right breast pain, with radiation to the armpit, unclear if she is having neuralgia from her lumpectomy, radiating to the shoulder .  Recommend getting a CT chest without contrast for further evaluation.  Recommend getting an mammogram as outpatient.  Pain control.      Type 2 DM; uncontrolled with hyperglycemia with diabetic neuropathy.  Insulin dependent.  CBG (last 3)  Recent Labs    08/17/21 1632 08/17/21 2153 08/18/21 0723  GLUCAP 201* 172* 171*   Poorly controlled with hyperglycemia. Hemoglobin A1c is 9.3.  Continue with SSI and started the patient on semglee 5 units daily.  Suspect non compliance to meds.    H/O of CVA involving the right frontal lobe.  On eliquis and crestor.  H/o PE and DVT On eliquis.    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  Wound care consulted and appreciate recommendations.     Estimated body mass index is 27.07 kg/m as calculated from the following:   Height as of this encounter: '5\' 7"'$  (1.702 m).   Weight as of this encounter: 78.4 kg.  Code Status: FULL CODE DVT Prophylaxis:   apixaban (ELIQUIS) tablet 5 mg   Level of Care: Level of care: Telemetry Medical Family Communication: family at bedside.   Disposition Plan:     Remains inpatient appropriate:  IV fluids and IV antibiotics.   Procedures:  CT head.   Consultants:   None.   Antimicrobials:   Anti-infectives (From admission, onward)    Start     Dose/Rate Route Frequency Ordered Stop   08/17/21 2200  cefTRIAXone (ROCEPHIN) 1 g in sodium chloride 0.9 % 100 mL IVPB        1 g 200 mL/hr over 30 Minutes  Intravenous Every 24 hours 08/17/21 0410     08/16/21 2230  cefTRIAXone (ROCEPHIN) 1 g in sodium chloride 0.9 % 100 mL IVPB        1 g 200 mL/hr over 30 Minutes Intravenous  Once 08/16/21 2227 08/17/21 0059        Medications  Scheduled Meds:  apixaban  5 mg Oral BID   exemestane  25 mg Oral QPC breakfast   febuxostat  40 mg Oral Daily   gabapentin  300 mg Oral QHS   insulin aspart  0-9 Units Subcutaneous TID WC   insulin glargine-yfgn  5 Units Subcutaneous QHS   metoprolol succinate  25 mg Oral QHS   mirtazapine  15 mg Oral QHS   pantoprazole  40 mg Oral BID AC   pramipexole  0.25 mg Oral QHS   rosuvastatin  20 mg Oral Daily   sodium chloride flush  3 mL Intravenous Q12H   Continuous Infusions:  cefTRIAXone (ROCEPHIN)  IV 1 g (08/17/21 2251)   PRN Meds:.acetaminophen **OR** acetaminophen, ALPRAZolam, polyethylene glycol    Subjective:   [name] was seen and examined today. She reports right sided chest wall pain since 6 to  9 months radiating to right armpit. Intermittent, not associated with any other complaints.  She reports her mammogram is scheduled this year.      Vitals:   08/17/21 2311 08/18/21 0435 08/18/21 0500 08/18/21 0937  BP: 129/68 133/72  114/72  Pulse: 65 61  (!) 58  Resp: '18 18  17  '$ Temp: 98 F (36.7 C) 98.1 F (36.7 C)  98.1 F (36.7 C)  TempSrc: Oral Oral  Oral  SpO2: 97% 95%  96%  Weight:   78.4 kg   Height:        Intake/Output Summary (Last 24 hours) at 08/18/2021 1105 Last data filed at 08/18/2021 1194 Gross per 24 hour  Intake 880.72 ml  Output 1550 ml  Net -669.28 ml    Filed Weights   08/17/21 0749 08/18/21 0500  Weight: 78.9 kg 78.4 kg     Exam General: Alert and oriented x 3, NAD Cardiovascular: S1 S2 auscultated, no murmurs, RRR Respiratory: Clear to auscultation bilaterally, no wheezing, rales or rhonchi Gastrointestinal: Soft, nontender, nondistended, + bowel sounds Ext: no pedal edema bilaterally Neuro: AAOx3,  Cr N's II- XII. Strength 5/5 upper and lower extremities bilaterally Skin: No rashes Psych: Normal affect and demeanor, alert and oriented x3    Data  Reviewed:  I have personally reviewed following labs and imaging studies   CBC Lab Results  Component Value Date   WBC 8.1 08/18/2021   RBC 4.04 08/18/2021   HGB 12.0 08/18/2021   HCT 36.1 08/18/2021   MCV 89.4 08/18/2021   MCH 29.7 08/18/2021   PLT 163 08/18/2021   MCHC 33.2 08/18/2021   RDW 13.5 08/18/2021   LYMPHSABS 2.0 08/18/2021   MONOABS 0.6 08/18/2021   EOSABS 0.2 08/18/2021   BASOSABS 0.1 69/67/8938     Last metabolic panel Lab Results  Component Value Date   NA 130 (L) 08/18/2021   K 3.6 08/18/2021   CL 92 (L) 08/18/2021   CO2 27 08/18/2021   BUN 23 08/18/2021   CREATININE 1.42 (H) 08/18/2021   GLUCOSE 184 (H) 08/18/2021   GFRNONAA 37 (L) 08/18/2021   GFRAA 34 (L) 12/05/2019   CALCIUM 9.0 08/18/2021   PHOS 2.9 02/23/2020   PROT 6.2 (L) 08/17/2021   ALBUMIN 3.1 (L) 08/17/2021   LABGLOB 2.4 08/01/2017   AGRATIO 2.0 08/01/2017   BILITOT 0.6 08/17/2021   ALKPHOS 62 08/17/2021   AST 25 08/17/2021   ALT 23 08/17/2021   ANIONGAP 11 08/18/2021    CBG (last 3)  Recent Labs    08/17/21 1632 08/17/21 2153 08/18/21 0723  GLUCAP 201* 172* 171*       Coagulation Profile: No results for input(s): INR, PROTIME in the last 168 hours.   Radiology Studies: CT Head Wo Contrast  Result Date: 08/16/2021 CLINICAL DATA:  Altered mental status. EXAM: CT HEAD WITHOUT CONTRAST TECHNIQUE: Contiguous axial images were obtained from the base of the skull through the vertex without intravenous contrast. RADIATION DOSE REDUCTION: This exam was performed according to the departmental dose-optimization program which includes automated exposure control, adjustment of the mA and/or kV according to patient size and/or use of iterative reconstruction technique. COMPARISON:  CT head dated 11/26/2019. FINDINGS: Brain: No evidence  of acute infarction, hemorrhage, hydrocephalus, extra-axial collection or mass lesion/mass effect. Periventricular white matter hypoattenuation likely represents chronic small vessel ischemic disease. Vascular: There are vascular calcifications in the carotid siphons. Skull: Normal. Negative for fracture or focal lesion. Sinuses/Orbits: No acute finding. Other: None. IMPRESSION: No acute intracranial process. Electronically Signed   By: Zerita Boers M.D.   On: 08/16/2021 20:36   DG Chest Portable 1 View  Result Date: 08/16/2021 CLINICAL DATA:  Altered mental status. EXAM: PORTABLE CHEST 1 VIEW COMPARISON:  Chest radiograph dated 05/12/2021. FINDINGS: The patient is rotated to the right. The heart size and mediastinal contours are within normal limits. Vascular calcifications are seen in the aortic arch. both lungs are clear. The visualized skeletal structures are unremarkable. IMPRESSION: No active disease. Aortic Atherosclerosis (ICD10-I70.0). Electronically Signed   By: Zerita Boers M.D.   On: 08/16/2021 20:25       Hosie Poisson M.D. [facility] 08/18/2021, 11:05 AM  Available via Epic secure chat 7am-7pm After 7 pm, please refer to night coverage provider listed on amion.

## 2021-08-18 NOTE — Consult Note (Signed)
[place] Nurse Consult Note: Patient receiving care in [facility] [phone]. This consult was completed after review of record and Secure Chat information obtained by the patient's primary nurse, [place], RN Reason for Consult: right foot wound Wound type: stable, calloused area to right medial foot Pressure Injury POA: Yes/No/NA Measurement: Wound bed: dry, stable, black/brown callous without surrounding erythema, no drainage when area palpated by primary RN Drainage (amount, consistency, odor) none Periwound: Dressing procedure/placement/frequency: Apply iodine from the swabsticks or swab pads from clean utility to the calloused area on the right foot.  Allow to air dry. Then place the foot into the Prevalon boot.  Monitor the wound area(s) for worsening of condition such as: Signs/symptoms of infection,  Increase in size,  Development of or worsening of odor, Development of pain, or increased pain at the affected locations.  Notify the medical team if any of these develop.  Thank you for the consult.  Discussed plan of care with the bedside nurse.  [facility] [facility] nurse will not follow at this time.  Please re-consult the [place] team if needed.  Val Riles, RN, MSN, CWOCN, CNS-BC, pager [phone]

## 2021-08-18 NOTE — Progress Notes (Signed)
Paged Dr. Karleen Hampshire, Pt is complaining of pain in her rt breast to her rt axilla. She said that it is sore, and at times she cannot stand it. She is hoping to get it scanned while she is here.

## 2021-08-18 NOTE — TOC Initial Note (Signed)
Transition of Care [facility]) - Initial/Assessment Note    Patient Details  Name: [name] MRN: [id number] Date of Birth: 1941-02-06  Transition of Care [facility]) CM/SW Contact:    Tom-Johnson, Renea Ee, RN Phone Number: 08/18/2021, 2:56 PM  Clinical Narrative:                    Expected Discharge Plan: [place]  CM spoke with patient at bedside about needs for post hospital transition. Admitted for AMS. Has hx of Dementia, CVA, DVT/PE and Breast Cancer with bilateral Lumpectomy. Had Radiation Therapy and currently on Aromasin. Found to have hyponatremia, Hypokalemic , AKI, and possible UTI. On IV rocephin,  pending urine cultures.  States she lives with her husband and have three children. Currently retired. Family transports to and from appointments.  Has a cane, walker, manual w/c, electric w/c and grab bars.  PCP is Cipriano Mile, NP and uses [facility] on [address].  Currently active with [facility] for home health PT/OT. Resumption of care called in to [facility] and info on AVS.  CM will continue to follow with needs.    Patient Goals and CMS Choice Patient states their goals for this hospitalization and ongoing recovery are:: To return home CMS Medicare.gov Compare Post Acute Care list provided to:: Patient Choice offered to / list presented to : Patient  Expected Discharge Plan and Services Expected Discharge Plan: [place]   Discharge Planning Services: CM Consult Post Acute Care Choice: [place] arrangements for the past 2 months: Single Family Home                 DME Arranged: N/A DME Agency: NA       HH Arranged: PT, OT, RN, Disease Management [place] Agency: [place] Date HH Agency Contacted: 08/18/21 Time HH Agency Contacted: [phone] Representative spoke with at [place]: [facility]  Prior Living Arrangements/Services Living arrangements for the past 2 months: [place] with:: Spouse Patient language and need for interpreter reviewed:: Yes Do you feel safe going back to the place where you live?: Yes      Need for Family Participation in Patient Care: Yes (Comment) Care giver support system in place?: Yes (comment) Current home services: Home PT, Home OT Criminal Activity/Legal Involvement Pertinent to Current Situation/Hospitalization: No - Comment as needed  Activities of Daily Living Home Assistive Devices/Equipment: Wheelchair, Environmental consultant (specify type) ADL Screening (condition at time of admission) Patient's cognitive ability adequate to safely complete daily activities?: Yes Is the patient deaf or have difficulty hearing?: No Does the patient have difficulty seeing, even when wearing glasses/contacts?: No Does the patient have difficulty concentrating, remembering, or making decisions?: No Patient able to express need for assistance with ADLs?: Yes Does the patient have difficulty dressing or bathing?: Yes Independently performs ADLs?: No Communication: Independent Dressing (OT): Needs assistance Is this a change from baseline?: Pre-admission baseline Grooming: Needs assistance Is this a change from baseline?: Pre-admission baseline Bathing: Needs assistance Is this a change from baseline?: Pre-admission baseline Toileting: Needs assistance Is this a change from baseline?: Pre-admission baseline In/Out Bed: Needs assistance Is this a change from baseline?: Pre-admission baseline Walks in Home: Needs assistance Is this a change from baseline?: Pre-admission baseline Does the patient have difficulty walking or climbing stairs?: Yes Weakness of Legs: Both Weakness of Arms/Hands: Both  Permission Sought/Granted Permission sought to share information with : Case Manager, Customer service manager, Family  Supports Permission granted to share information with : Yes, Verbal Permission Granted  Share Information with NAME:  Velna Hatchet  Permission granted to share info w AGENCY: [facility]        Emotional Assessment Appearance:: Appears stated age Attitude/Demeanor/Rapport: Engaged, Gracious Affect (typically observed): Accepting, Appropriate, Calm, Hopeful Orientation: : Oriented to Self, Oriented to Place, Oriented to Situation Alcohol / Substance Use: Not Applicable Psych Involvement: No (comment)  Admission diagnosis:  Altered mental status, unspecified altered mental status type [W80.32] Acute metabolic encephalopathy [Z22.48] AMS (altered mental status) [R41.82] Patient Active Problem List   Diagnosis Date Noted   Acute metabolic encephalopathy 25/00/3704   AMS (altered mental status) 08/16/2021   Chest pain 07/19/2020   PUD (peptic ulcer disease) 07/19/2020   Pulmonary emboli ([place]) 02/24/2020   Pulmonary nodule 02/21/2020   S/p left hip fracture 02/21/2020   History of CVA (cerebrovascular accident) 02/21/2020   Acute renal failure superimposed on stage 4 chronic kidney disease ([place]) 02/21/2020   Subdural hemorrhage ([place]) 02/15/2020   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   Somnolence, daytime 04/06/2018   Chronic diastolic CHF (congestive heart failure), NYHA class 2 ([place]) 02/28/2018   Hypokalemia 02/24/2018   Urinary retention 02/24/2018   Posterior tibial tendinitis of right leg 02/22/2018   Posterior tibial tendinitis, right leg    Genetic testing 12/03/2016   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   Malignant neoplasm of upper-outer quadrant of left breast in female, estrogen receptor positive ([place]) 11/19/2016   Chronic idiopathic gout involving toe of left foot without tophus 11/15/2016   Malignant 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, estrogen receptor positive ([place]) 11/09/2016   Tendon tear, ankle, left, sequela    Traumatic rupture of left anterior tibial tendon 04/20/2016   Acute kidney injury superimposed on CKD (HCC)    Headache,  migraine    CKD (chronic kidney disease) stage 4, GFR 15-29 ml/min ([place]) 11/17/2015   Restless legs syndrome 07/24/2015   Mild cognitive impairment 01/08/2015   Abnormality of gait 01/08/2015   Dizziness 12/19/2014   Preoperative cardiovascular examination 12/11/2013   GERD (gastroesophageal reflux disease) 10/06/2013   Syncope 10/05/2013   Anxiety 10/28/2012    Class: Acute   Bilateral lower extremity edema 10/28/2012   CAD S/P percutaneous coronary angioplasty    Essential hypertension    Dyslipidemia, goal LDL below 70    PCP:  Cipriano Mile, NP Pharmacy:   [place] [place] [address] [place] [address] Phone: [phone] Fax: [phone]  [facility] [place], [place] [address] [place] [address] Phone: [phone] Fax: [phone]  [facility] [id number] - [place], [place] - [address] AT [place] [address] [place] [address] Phone: [phone] Fax: [phone]  [facility] [address] [place] [address] Phone: [phone] Fax: [phone]     Social Determinants of Health (SDOH) Interventions    Readmission Risk Interventions    07/21/2020    9:29 AM  Readmission Risk Prevention Plan  Transportation Screening Complete  Medication Review ([place]) Complete  PCP or Specialist appointment within 3-5 days of discharge Complete  HRI or [place] Complete  SW Recovery Care/Counseling Consult Complete  [place] Not Applicable

## 2021-08-18 NOTE — Progress Notes (Signed)
Pharmacy Antibiotic Note  [name] is a 81 y.o. female admitted on 08/16/2021 with AMS and weakness. Ceftriaxone begun for UTI coverage.  Culture is now growing Enterococcus gallinarium. Pharmacy has been consulted for Unasyn dosing. Per discussion with Dr. Karleen Hampshire, broad coverage for now, but plan to narrow or change to oral when sensitivities are available.  Plan:  Unasyn 1.5 gm IV q12h.  Follow renal function for any need to adjust regimen.  Follow up final culture data and ability to narrow.  Height: '5\' 7"'$  (170.2 cm) Weight: 78.4 kg (172 lb 13.5 oz) IBW/kg (Calculated) : 61.6  Temp (24hrs), Avg:98 F (36.7 C), Min:97.6 F (36.4 C), Max:98.4 F (36.9 C)  Recent Labs  Lab 08/16/21 2010 08/17/21 0505 08/18/21 0259  WBC 13.2* 10.4 8.1  CREATININE 1.93* 1.96* 1.42*    Estimated Creatinine Clearance: 32.4 mL/min (A) (by C-G formula based on SCr of 1.42 mg/dL (H)).    Allergies  Allergen Reactions   Nsaids Nausea And Vomiting and Palpitations   Reglan [Metoclopramide] Other (See Comments)    Seizures    Tramadol Other (See Comments)    Seizures    Ultram [Tramadol] Other (See Comments)    Pt has seizure activity with this medication   Lipitor [Atorvastatin] Other (See Comments)    Bone and muscle pain   Lipitor [Atorvastatin] Swelling and Other (See Comments)    Pain    Lyrica [Pregabalin] Other (See Comments)    Weight gain, extremity swelling   Lyrica [Pregabalin] Swelling   Reglan [Metoclopramide] Other (See Comments)    Tardive dyskinesia; paradoxical reaction not relieved by benadryl   Nsaids Swelling    SWELLING REACTION UNSPECIFIED    Urecholine [Bethanechol]     Palpitations and nausea    Antimicrobials this admission:  Ceftriaxone 5/22>>5/23  Unasyn 5/23 >>  Dose adjustments this admission: n/a  Microbiology results: 5/22 Urine: >100K/ml Enterococcus gallinarum, sensitivities pending  Thank you for allowing pharmacy to be a part of this  patient's care.  Arty Baumgartner, [place] 08/18/2021 6:53 PM

## 2021-08-18 NOTE — Progress Notes (Signed)
Patient to receive CT scan to right axilla for report of increased pain to area per orders.

## 2021-08-18 NOTE — Plan of Care (Signed)
  Problem: Clinical Measurements: Goal: Ability to maintain clinical measurements within normal limits will improve Outcome: Progressing   

## 2021-08-18 NOTE — Progress Notes (Signed)
Patient with reports of headache that started 30 minutes ago. States that she has had a sudden change of vision, blurred vision and difficulty focusing. Is also reporting that her head "feels foggy" like shes "not right in the head". Notable shakiness to R arm and head, change from baseline assessment. Vitals obtained. 135/80, 67, 97 RA, 18. Able to state name, location, birthday. Orientation intact but responses delayed from previous assessment. Call placed to provider. Call placed to rapid for possible code stroke.

## 2021-08-18 NOTE — Significant Event (Signed)
Rapid Response Event Note   Reason for Call :  Headache, visual changes  Initial Focused Assessment:  Pt lying in bed, AO. She complains of change in her vision. She describes it as a veil that comes down over her vision. She endorses a pressure headache. She also feels she's can't grasp her thoughts when this happens. NIH 1 for decreased sensory to her left face and left leg.   VS: T 97.21F, BP 135/80, HR 67, RR 18, SpO2 97% on room air CBG: 224  Interventions:  -CT head  Plan of Care:  -MRI brain  Call rapid response for additional needs  Event Summary:  MD Notified: Dr. Karleen Hampshire Call Time: [place] Time: 1720 End Time: [place], RN

## 2021-08-19 ENCOUNTER — Inpatient Hospital Stay (HOSPITAL_COMMUNITY)
Admit: 2021-08-19 | Discharge: 2021-08-19 | Disposition: A | Discharge status: Home / Self Care | Payer: Medicare Other | Attending: Physician Assistant | Admitting: Physician Assistant

## 2021-08-19 ENCOUNTER — Inpatient Hospital Stay (HOSPITAL_COMMUNITY): Payer: Medicare Other

## 2021-08-19 ENCOUNTER — Other Ambulatory Visit: Payer: Self-pay | Admitting: Physician Assistant

## 2021-08-19 ENCOUNTER — Inpatient Hospital Stay (HOSPITAL_COMMUNITY)
Admit: 2021-08-19 | Discharge: 2021-08-19 | Disposition: A | Discharge status: Home / Self Care | Payer: Medicare Other | Attending: Neurology | Admitting: Neurology

## 2021-08-19 DIAGNOSIS — R55 Syncope and collapse: Secondary | ICD-10-CM

## 2021-08-19 DIAGNOSIS — G43109 Migraine with aura, not intractable, without status migrainosus: Secondary | ICD-10-CM

## 2021-08-19 DIAGNOSIS — R4182 Altered mental status, unspecified: Secondary | ICD-10-CM | POA: Diagnosis not present

## 2021-08-19 DIAGNOSIS — F322 Major depressive disorder, single episode, severe without psychotic features: Secondary | ICD-10-CM

## 2021-08-19 DIAGNOSIS — G4486 Cervicogenic headache: Secondary | ICD-10-CM | POA: Diagnosis not present

## 2021-08-19 DIAGNOSIS — F419 Anxiety disorder, unspecified: Secondary | ICD-10-CM | POA: Diagnosis not present

## 2021-08-19 DIAGNOSIS — D649 Anemia, unspecified: Secondary | ICD-10-CM

## 2021-08-19 DIAGNOSIS — I2510 Atherosclerotic heart disease of native coronary artery without angina pectoris: Secondary | ICD-10-CM | POA: Diagnosis not present

## 2021-08-19 DIAGNOSIS — G9341 Metabolic encephalopathy: Secondary | ICD-10-CM | POA: Diagnosis not present

## 2021-08-19 DIAGNOSIS — R404 Transient alteration of awareness: Secondary | ICD-10-CM | POA: Diagnosis not present

## 2021-08-19 DIAGNOSIS — N179 Acute kidney failure, unspecified: Secondary | ICD-10-CM | POA: Diagnosis not present

## 2021-08-19 LAB — LIPID PANEL
Cholesterol: 101 mg/dL (ref 0–200)
HDL: 30 mg/dL — ABNORMAL LOW (ref >40)
LDL Cholesterol: 47 mg/dL (ref 0–99)
Total CHOL/HDL Ratio: 3.4 RATIO
Triglycerides: 119 mg/dL (ref <150)
VLDL: 24 mg/dL (ref 0–40)

## 2021-08-19 LAB — BASIC METABOLIC PANEL
Anion gap: 10 (ref 5–15)
BUN: 14 mg/dL (ref 8–23)
CO2: 28 mmol/L (ref 22–32)
Calcium: 9.4 mg/dL (ref 8.9–10.3)
Chloride: 96 mmol/L — ABNORMAL LOW (ref 98–111)
Creatinine, Ser: 1.42 mg/dL — ABNORMAL HIGH (ref 0.44–1.00)
GFR, Estimated: 37 mL/min — ABNORMAL LOW (ref >60)
Glucose, Bld: 198 mg/dL — ABNORMAL HIGH (ref 70–99)
Potassium: 3.5 mmol/L (ref 3.5–5.1)
Sodium: 134 mmol/L — ABNORMAL LOW (ref 135–145)

## 2021-08-19 LAB — GLUCOSE, CAPILLARY
Glucose-Capillary: 216 mg/dL — ABNORMAL HIGH (ref 70–99)
Glucose-Capillary: 203 mg/dL — ABNORMAL HIGH (ref 70–99)
Glucose-Capillary: 160 mg/dL — ABNORMAL HIGH (ref 70–99)
Glucose-Capillary: 163 mg/dL — ABNORMAL HIGH (ref 70–99)

## 2021-08-19 LAB — ECHOCARDIOGRAM COMPLETE
Area-P 1/2: 3.32 cm2
Calc EF: 67.6 %
Height: 67 in
S' Lateral: 2.9 cm
Single Plane A2C EF: 74.4 %
Single Plane A4C EF: 60.1 %
Weight: 2772.5 oz

## 2021-08-19 LAB — URINE CULTURE: Culture: >=100000 — AB

## 2021-08-19 IMAGING — CT CT CTA ABD/PEL W/CM AND/OR W/O CM
2 of 16 series · 10 of 46 positions shown, 14 images · IV contrast (APPLIED)
Comparison: 02/17/2020
COMPARISON: 02/17/2020

Addendum:
CLINICAL DATA: [place]

EXAM:
CTA ABDOMEN AND PELVIS WITHOUT AND WITH CONTRAST
TECHNIQUE: Multidetector CT imaging of the abdomen and pelvis was performed
using the standard protocol during bolus administration of
intravenous contrast. Multiplanar reconstructed images and MIPs were
obtained and reviewed to evaluate the vascular anatomy.
CONTRAST:  80mL OMNIPAQUE IOHEXOL 350 MG/ML SOLN

[Series 13: cor · coronal · 0.86mm/px · 1 of 172 slices shown]
[im 86/172  soft-tissue]
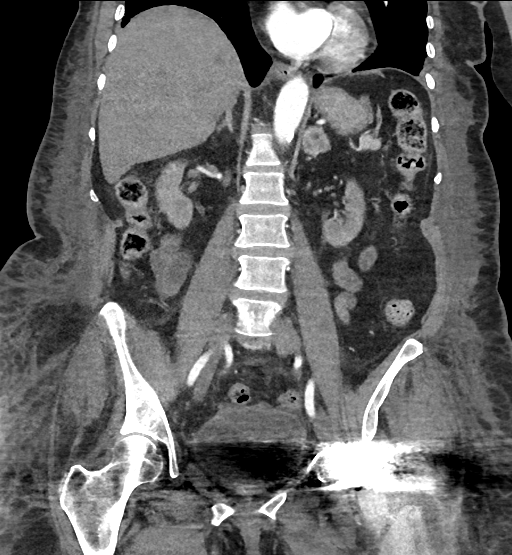

[Series 18: venous thins · axial · portal-venous · 0.97mm/px · z∈[+1127,+1516]mm · 9 of 1191 slices shown, 13 images]
[im 109/1191  soft-tissue]
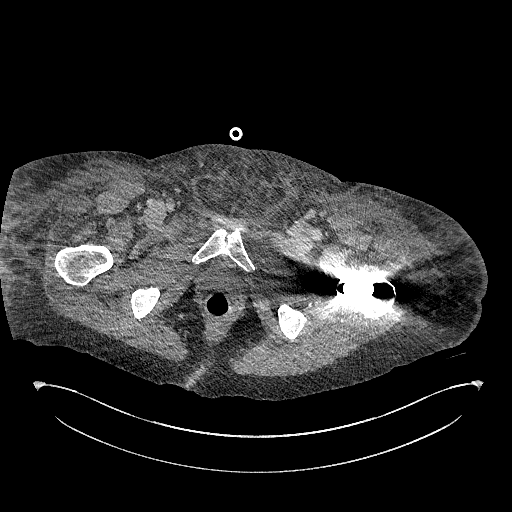
[im 109/1191  bone]
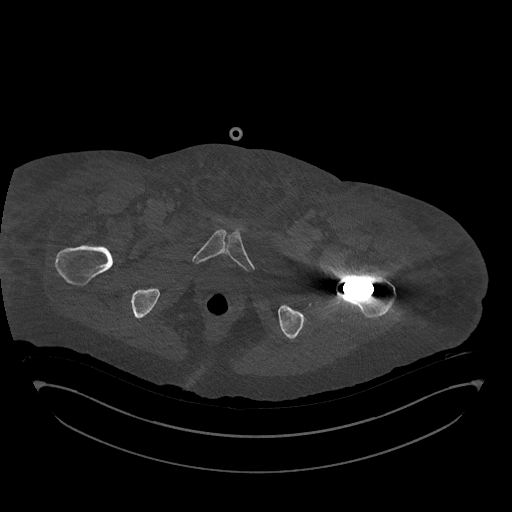
[im 217/1191  soft-tissue]
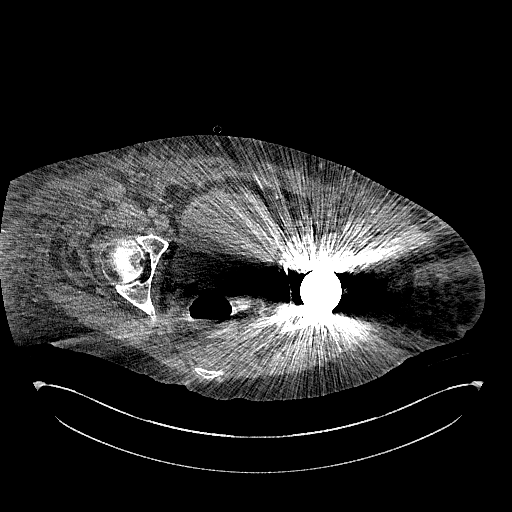
[im 433/1191  soft-tissue]
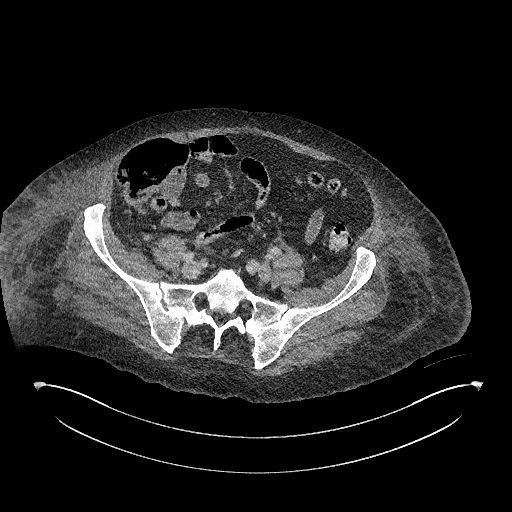
[im 541/1191  soft-tissue]
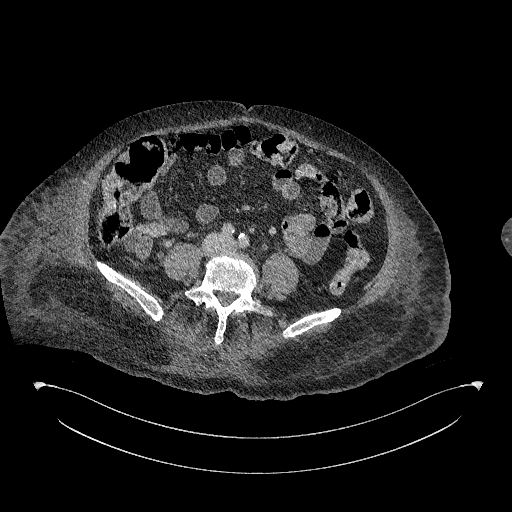
[im 650/1191  soft-tissue]
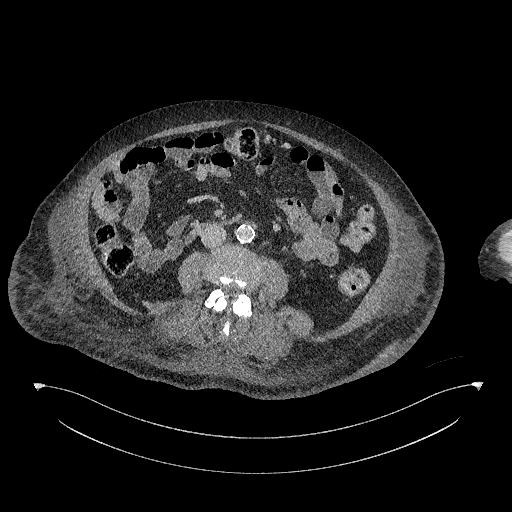
[im 758/1191  soft-tissue]
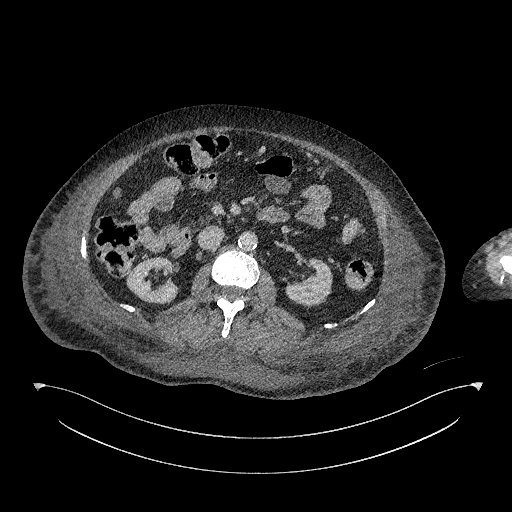
[im 758/1191  lung]
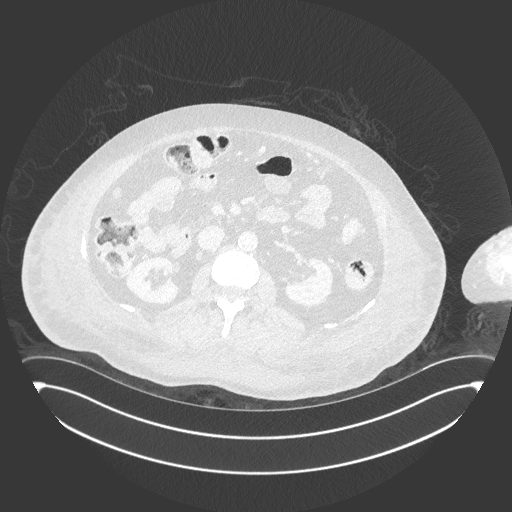
[im 866/1191  lung]
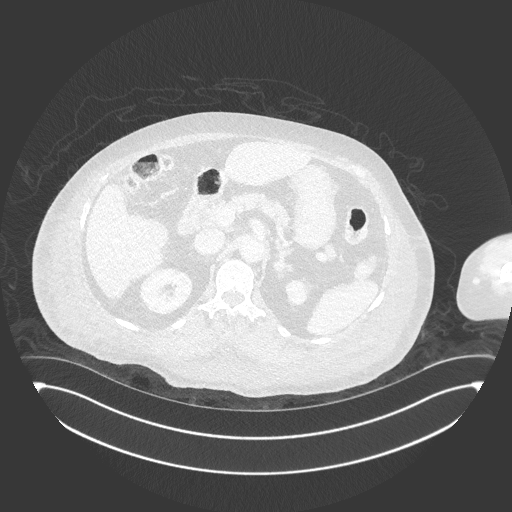
[im 974/1191  soft-tissue]
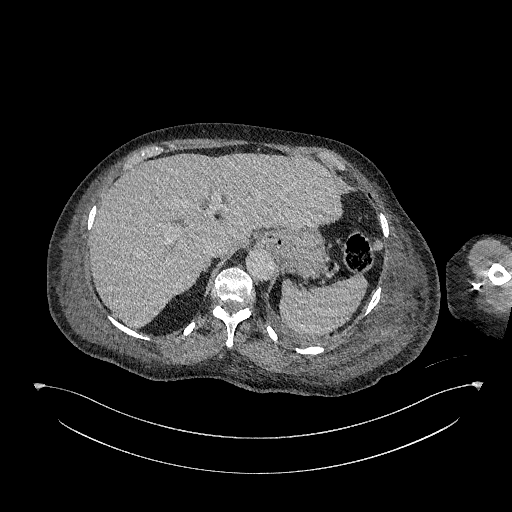
[im 974/1191  lung]
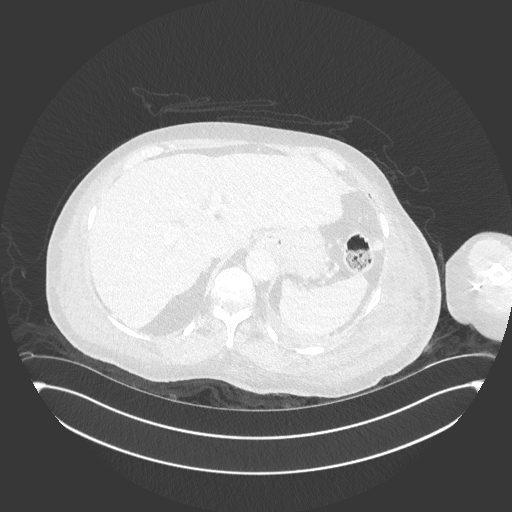
[im 1082/1191  soft-tissue]
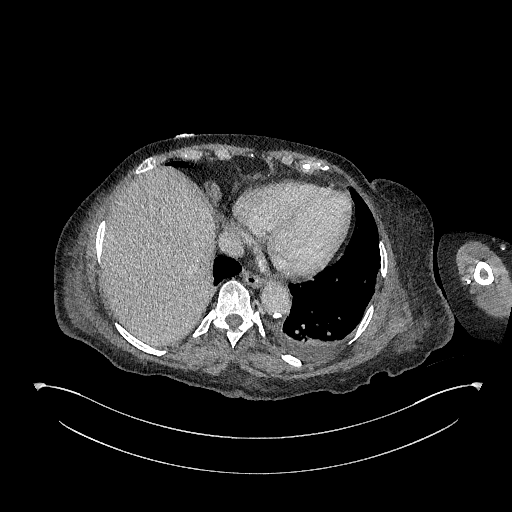
[im 1082/1191  lung]
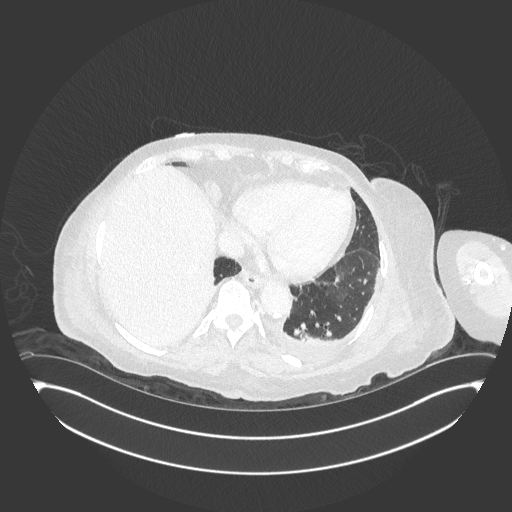

[10 of 46 positions shown; findings below may reference images not displayed]

FINDINGS: VASCULAR

Aorta: Normal caliber aorta without aneurysm, dissection, vasculitis
or significant stenosis. Aortic calcifications.

Celiac: Patent without evidence of aneurysm, dissection, vasculitis
or significant stenosis.

SMA: Patent without evidence of aneurysm, dissection, vasculitis or
significant stenosis.

Renals: Both renal arteries are patent without evidence of aneurysm,
dissection, vasculitis, fibromuscular dysplasia or significant
stenosis.

IMA: Patent without evidence of aneurysm, dissection, vasculitis or
significant stenosis.

Inflow: Patent without evidence of aneurysm, dissection, vasculitis
or significant stenosis.

Proximal Outflow: Bilateral common femoral and visualized portions
of the superficial and profunda femoral arteries are patent without
evidence of aneurysm, dissection, vasculitis or significant
stenosis.

Veins: No obvious venous abnormality within the limitations of this
arterial phase study.

Review of the MIP images confirms the above findings.

NON-VASCULAR

Lower chest: Small bilateral pleural effusions with basilar
atelectasis.

Hepatobiliary: No focal liver abnormality is seen. Status post
cholecystectomy. No biliary dilatation.

Pancreas: Unremarkable. No pancreatic ductal dilatation or
surrounding inflammatory changes.

Spleen: Normal in size without focal abnormality.

Adrenals/Urinary Tract: 3 cm diameter left adrenal gland nodule is
unchanged since prior studies. Kidneys are symmetrical with
homogeneous nephrograms. No hydronephrosis or hydroureter. Bladder
wall is mildly thickened with small amount of bladder gas. This
could be due to instrumentation or cystitis.

Stomach/Bowel: Stomach, small bowel, and colon are not abnormally
distended. No areas of contrast extravasation are identified to
suggest a site of active bleeding. Appendix is normal.

Lymphatic: No significant lymphadenopathy.

Reproductive: Status post hysterectomy. No adnexal masses.

Other: No free air or free fluid in the abdomen. Abdominal wall
musculature appears intact. Prominent soft tissue edema in the
subcutaneous fat of the abdomen and pelvis.

Incidental note of pulmonary emboli in the left lower lung,
partially seen only on the superior most images.

Musculoskeletal: Degenerative changes in the spine. No destructive
bone lesions. Left hip arthroplasty.
IMPRESSION: 1. No evidence of focal contrast extravasation to suggest a site of
active bleeding.
2. Aortic atherosclerosis. No significant arterial stenosis.
3. Incidental note of pulmonary emboli in the left lower lung,
partially seen only on the superior most images.
4. Small bilateral pleural effusions with basilar atelectasis.
5. Bladder wall is mildly thickened with small amount of bladder
gas. This could be due to instrumentation or cystitis.
6. Prominent soft tissue edema in the subcutaneous fat of the
abdomen and pelvis.
7. 3 cm diameter left adrenal gland nodule is unchanged since prior
studies.

Aortic Atherosclerosis (9ZFS3-8IA.A).

ADDENDUM:
Critical Value/emergent results were called by telephone at the time
of interpretation on 02/24/2020 at [DATE] to nurse [place],and
provider [place] who verbally acknowledged these results.

*** End of Addendum ***
FINDINGS: VASCULAR

Aorta: Normal caliber aorta without aneurysm, dissection, vasculitis
or significant stenosis. Aortic calcifications.

Celiac: Patent without evidence of aneurysm, dissection, vasculitis
or significant stenosis.

SMA: Patent without evidence of aneurysm, dissection, vasculitis or
significant stenosis.

Renals: Both renal arteries are patent without evidence of aneurysm,
dissection, vasculitis, fibromuscular dysplasia or significant
stenosis.

IMA: Patent without evidence of aneurysm, dissection, vasculitis or
significant stenosis.

Inflow: Patent without evidence of aneurysm, dissection, vasculitis
or significant stenosis.

Proximal Outflow: Bilateral common femoral and visualized portions
of the superficial and profunda femoral arteries are patent without
evidence of aneurysm, dissection, vasculitis or significant
stenosis.

Veins: No obvious venous abnormality within the limitations of this
arterial phase study.

Review of the MIP images confirms the above findings.

NON-VASCULAR

Lower chest: Small bilateral pleural effusions with basilar
atelectasis.

Hepatobiliary: No focal liver abnormality is seen. Status post
cholecystectomy. No biliary dilatation.

Pancreas: Unremarkable. No pancreatic ductal dilatation or
surrounding inflammatory changes.

Spleen: Normal in size without focal abnormality.

Adrenals/Urinary Tract: 3 cm diameter left adrenal gland nodule is
unchanged since prior studies. Kidneys are symmetrical with
homogeneous nephrograms. No hydronephrosis or hydroureter. Bladder
wall is mildly thickened with small amount of bladder gas. This
could be due to instrumentation or cystitis.

Stomach/Bowel: Stomach, small bowel, and colon are not abnormally
distended. No areas of contrast extravasation are identified to
suggest a site of active bleeding. Appendix is normal.

Lymphatic: No significant lymphadenopathy.

Reproductive: Status post hysterectomy. No adnexal masses.

Other: No free air or free fluid in the abdomen. Abdominal wall
musculature appears intact. Prominent soft tissue edema in the
subcutaneous fat of the abdomen and pelvis.

Incidental note of pulmonary emboli in the left lower lung,
partially seen only on the superior most images.

Musculoskeletal: Degenerative changes in the spine. No destructive
bone lesions. Left hip arthroplasty.
IMPRESSION: 1. No evidence of focal contrast extravasation to suggest a site of
active bleeding.
2. Aortic atherosclerosis. No significant arterial stenosis.
3. Incidental note of pulmonary emboli in the left lower lung,
partially seen only on the superior most images.
4. Small bilateral pleural effusions with basilar atelectasis.
5. Bladder wall is mildly thickened with small amount of bladder
gas. This could be due to instrumentation or cystitis.
6. Prominent soft tissue edema in the subcutaneous fat of the
abdomen and pelvis.
7. 3 cm diameter left adrenal gland nodule is unchanged since prior
studies.

Aortic Atherosclerosis (9ZFS3-8IA.A).

## 2021-08-19 MED ORDER — POTASSIUM CHLORIDE CRYS ER 20 MEQ PO TBCR
40.0000 meq | EXTENDED_RELEASE_TABLET | Freq: Every day | ORAL | Status: DC
  Administered 2021-08-19 – 2021-08-20 (×2): 40 meq via ORAL
  Filled 2021-08-19 (×2): qty 2

## 2021-08-19 MED ORDER — DIVALPROEX SODIUM ER 500 MG PO TB24
500.0000 mg | ORAL_TABLET | Freq: Every day | ORAL | Status: DC
  Filled 2021-08-19 (×2): qty 1

## 2021-08-19 MED ORDER — PERFLUTREN LIPID MICROSPHERE
1.0000 mL | INTRAVENOUS | Status: AC | PRN
  Administered 2021-08-19: 2 mL via INTRAVENOUS

## 2021-08-19 MED ORDER — INSULIN GLARGINE-YFGN 100 UNIT/ML ~~LOC~~ SOLN
10.0000 [IU] | Freq: Every day | SUBCUTANEOUS | Status: DC
Start: 2021-08-19 — End: 2021-08-20
  Administered 2021-08-19: 10 [IU] via SUBCUTANEOUS
  Filled 2021-08-19 (×2): qty 0.1

## 2021-08-19 MED ORDER — BUMETANIDE 2 MG PO TABS
2.0000 mg | ORAL_TABLET | Freq: Every day | ORAL | Status: DC
  Administered 2021-08-19 – 2021-08-20 (×2): 2 mg via ORAL
  Filled 2021-08-19 (×2): qty 1

## 2021-08-19 NOTE — Procedures (Signed)
Patient Name: [name]  MRN: [id number]  Epilepsy Attending: Lora Havens  Referring Physician/Provider: Lorenza Chick, MD Date: 08/19/2021 Duration: 21.45 mins  Patient history: 81 year old woman who presented with an episode of loss of consciousness that is concerning for seizure activity.  EEG to evaluate for seizure.  Level of alertness: Awake, asleep  AEDs during EEG study: VPA, GBP  Technical aspects: This EEG study was done with scalp electrodes positioned according to the 10-20 International system of electrode placement. Electrical activity was acquired at a sampling rate of '500Hz'$  and reviewed with a high frequency filter of '70Hz'$  and a low frequency filter of '1Hz'$ . EEG data were recorded continuously and digitally stored.   Description: The posterior dominant rhythm consists of 8-9 Hz activity of moderate voltage (25-35 uV) seen predominantly in posterior head regions, symmetric and reactive to eye opening and eye closing. Sleep was characterized by vertex waves, sleep spindles (12 to 14 Hz), maximal frontocentral region. Hyperventilation and photic stimulation were not performed.     IMPRESSION: This study is within normal limits. No seizures or epileptiform discharges were seen throughout the recording.  Simya Tercero Barbra Sarks

## 2021-08-19 NOTE — Progress Notes (Signed)
Zio AT monitor will be placed either later today or tomorrow once echo is done.

## 2021-08-19 NOTE — Progress Notes (Signed)
PROGRESS NOTE        PATIENT DETAILS Name: [name] Age: 81 y.o. Sex: female Date of Birth: 06/27/1940 Admit Date: 08/16/2021 Admitting Physician Hosie Poisson, MD SKA:JGOTL, [name], NP  Brief Summary: Patient is a 81 y.o.  female with history of VTE on Eliquis, HTN, HLD, DM-2, CAD s/p PCI, HFpEF, prior CVA, RLS, cognitive impairment, history of breast cancer-presented to the ED for evaluation of syncope/AMS/weakness-she was subsequently found to have UTI, hypokalemia, hyponatremia.    Significant events: 5/21>> admitted with weakness/AMS/syncope-UTI/hypokalemia/hyponatremia. 5/23>> headache/blurry vision-thought to be due to migraine.  Significant studies: 5/21>> CT head: No acute intracranial process 5/23>> postsurgical changes right breast-without acute abnormalities.  Small pulmonary nodules in both lobes-unchanged since 2021-benign. 5/23>> CT head: No acute intracranial abnormality 5/23>> MRI brain: No acute CVA  Significant microbiology data: 5/22>> urine culture: Enterococcus  Procedures: None  Consults: Neurology  Subjective: Eating breakfast-completely awake and alert!.  Daughter at bedside.  Objective: Vitals: Blood pressure 126/82, pulse 64, temperature 98.4 F (36.9 C), temperature source Oral, resp. rate 18, height '5\' 7"'$  (1.702 m), weight 78.6 kg, SpO2 100 %.   Exam: Gen Exam:Alert awake-not in any distress HEENT:atraumatic, normocephalic Chest: B/L clear to auscultation anteriorly CVS:S1S2 regular Abdomen:soft non tender, non distended Extremities:no edema Neurology: Non focal Skin: no rash  Pertinent Labs/Radiology:    Latest Ref Rng & Units 08/18/2021    2:59 AM 08/17/2021    5:05 AM 08/16/2021    8:17 PM  CBC  WBC 4.0 - 10.5 K/uL 8.1   10.4     Hemoglobin 12.0 - 15.0 g/dL 12.0   12.6   15.3    Hematocrit 36.0 - 46.0 % 36.1   36.7   45.0    Platelets 150 - 400 K/uL 163   188       Lab Results  Component Value  Date   NA 134 (L) 08/19/2021   K 3.5 08/19/2021   CL 96 (L) 08/19/2021   CO2 28 08/19/2021      Assessment/Plan: Acute metabolic encephalopathy: Due to UTI and dehydration.  Neuroimaging as above-clinically improved with supportive care-she is back to her baseline.  Complicated UTI: Urine culture positive for Enterococcus-awaiting sensitivity-continue Unasyn.  Telemetry negative.  Syncope: Per neurology-concerning for seizures (likely provoked from UTI/electrolyte abnormalities)-awaiting EEG.  MRI brain negative for significant abnormalities.  Check echo to complete work-up.  AKI on CKD stage IV: AKI likely hemodynamically mediated kidney injury-creatinine has stabilized with supportive care  Hyponatremia: Due to dehydration-improved.  Hypokalemia: Repleted.  Chronic HFpEF: Compensated.  Resume Bumex at half of home dose today.  CAD: No anginal symptoms-continue metoprolol/statin.  History of CVA: No focal deficits-on Eliquis/statin  Migraine headache: Had an episode of headache/blurry vision-thought to be migrainous in etiology per neurology-now on Depakote.  History of VTE: On Eliquis  HLD: Continue statin  DM-2 (A1c 9.3 on 5/23): CBGs remain on the higher side-increase Semglee to 10 units-continue SSI.  Resume oral hypoglycemic agents on discharge.   Recent Labs    08/18/21 1641 08/18/21 2018 08/19/21 0732  GLUCAP 224* 216* 216*    GERD: Continue PPI  RLS: On pramipexole  Anxiety: Stable-on Xanax/Remeron  History of breast cancer: Continue Aromasin-follow with primary oncologist postdischarge.  History of  cognitive dysfunction: At risk for delirium-maintain delirium precautions.  BMI: Estimated body mass index is 27.14 kg/m  as calculated from the following:   Height as of this encounter: '5\' 7"'$  (1.702 m).   Weight as of this encounter: 78.6 kg.   Code status:   Code Status: Full Code   DVT Prophylaxis: apixaban (ELIQUIS) tablet 5 mg    Family  Communication: Daughter at bedside   Disposition Plan: Status is: Inpatient Remains inpatient appropriate because: Ongoing work-up for encephalopathy-awaiting EEG/stroke MD evaluation.  Likely home on 5/25 with home health.   Planned Discharge Destination:Home health   Diet: Diet Order             Diet heart healthy/carb modified Room service appropriate? Yes; Fluid consistency: Thin  Diet effective now                     Antimicrobial agents: Anti-infectives (From admission, onward)    Start     Dose/Rate Route Frequency Ordered Stop   08/18/21 2000  ampicillin-sulbactam (UNASYN) 1.5 g in sodium chloride 0.9 % 100 mL IVPB        1.5 g 200 mL/hr over 30 Minutes Intravenous Every 12 hours 08/18/21 1853     08/17/21 2200  cefTRIAXone (ROCEPHIN) 1 g in sodium chloride 0.9 % 100 mL IVPB  Status:  Discontinued        1 g 200 mL/hr over 30 Minutes Intravenous Every 24 hours 08/17/21 0410 08/18/21 1807   08/16/21 2230  cefTRIAXone (ROCEPHIN) 1 g in sodium chloride 0.9 % 100 mL IVPB        1 g 200 mL/hr over 30 Minutes Intravenous  Once 08/16/21 2227 08/17/21 0059        MEDICATIONS: Scheduled Meds:  apixaban  5 mg Oral BID   divalproex  500 mg Oral Daily   exemestane  25 mg Oral QPC breakfast   febuxostat  40 mg Oral Daily   gabapentin  300 mg Oral QHS   insulin aspart  0-9 Units Subcutaneous TID WC   insulin glargine-yfgn  5 Units Subcutaneous QHS   metoprolol succinate  25 mg Oral QHS   mirtazapine  15 mg Oral QHS   pantoprazole  40 mg Oral BID AC   pramipexole  0.25 mg Oral QHS   rosuvastatin  20 mg Oral Daily   sodium chloride flush  3 mL Intravenous Q12H   Continuous Infusions:  ampicillin-sulbactam (UNASYN) IV 1.5 g (08/19/21 0937)   PRN Meds:.acetaminophen **OR** acetaminophen, ALPRAZolam, polyethylene glycol   I have personally reviewed following labs and imaging studies  LABORATORY DATA: CBC: Recent Labs  Lab 08/16/21 2010 08/16/21 2017  08/17/21 0505 08/18/21 0259  WBC 13.2*  --  10.4 8.1  NEUTROABS 9.4*  --   --  5.2  HGB 14.9 15.3* 12.6 12.0  HCT 42.7 45.0 36.7 36.1  MCV 86.8  --  88.4 89.4  PLT 238  --  188 865    Basic Metabolic Panel: Recent Labs  Lab 08/16/21 2010 08/16/21 2017 08/17/21 0024 08/17/21 0505 08/18/21 0259 08/19/21 0439  NA 128* 126*  --  129* 130* 134*  K 3.3* 3.0*  --  3.6 3.6 3.5  CL 81*  --   --  88* 92* 96*  CO2 32  --   --  '31 27 28  '$ GLUCOSE 148*  --   --  208* 184* 198*  BUN 28*  --   --  30* 23 14  CREATININE 1.93*  --   --  1.96* 1.42* 1.42*  CALCIUM 9.6  --   --  9.1 9.0 9.4  MG  --   --  2.1  --   --   --     GFR: Estimated Creatinine Clearance: 32.4 mL/min (A) (by C-G formula based on SCr of 1.42 mg/dL (H)).  Liver Function Tests: Recent Labs  Lab 08/16/21 2010 08/17/21 0505  AST 34 25  ALT 22 23  ALKPHOS 74 62  BILITOT 0.9 0.6  PROT 7.4 6.2*  ALBUMIN 3.8 3.1*   No results for input(s): LIPASE, AMYLASE in the last 168 hours. Recent Labs  Lab 08/16/21 2053  AMMONIA 17    Coagulation Profile: No results for input(s): INR, PROTIME in the last 168 hours.  Cardiac Enzymes: No results for input(s): CKTOTAL, CKMB, CKMBINDEX, TROPONINI in the last 168 hours.  BNP (last 3 results) No results for input(s): PROBNP in the last 8760 hours.  Lipid Profile: Recent Labs    08/19/21 0439  CHOL 101  HDL 30*  LDLCALC 47  TRIG 119  CHOLHDL 3.4    Thyroid Function Tests: Recent Labs    08/16/21 2053  TSH 1.195    Anemia Panel: No results for input(s): VITAMINB12, FOLATE, FERRITIN, TIBC, IRON, RETICCTPCT in the last 72 hours.  Urine analysis:    Component Value Date/Time   COLORURINE YELLOW 08/16/2021 2210   APPEARANCEUR HAZY (A) 08/16/2021 2210   LABSPEC 1.005 08/16/2021 2210   PHURINE 6.0 08/16/2021 2210   GLUCOSEU NEGATIVE 08/16/2021 2210   HGBUR NEGATIVE 08/16/2021 2210   BILIRUBINUR NEGATIVE 08/16/2021 2210   KETONESUR NEGATIVE 08/16/2021 2210    PROTEINUR NEGATIVE 08/16/2021 2210   UROBILINOGEN 0.2 10/05/2013 0059   NITRITE NEGATIVE 08/16/2021 2210   LEUKOCYTESUR MODERATE (A) 08/16/2021 2210    Sepsis Labs: Lactic Acid, Venous    Component Value Date/Time   LATICACIDVEN 1.4 03/30/2021 1814    MICROBIOLOGY: Recent Results (from the past 240 hour(s))  Urine Culture     Status: Abnormal (Preliminary result)   Collection Time: 08/17/21 10:04 AM   Specimen: Urine, Clean Catch  Result Value Ref Range Status   Specimen Description URINE, CLEAN CATCH  Final   Special Requests   Final    NONE Performed at [facility], [place] [address] [place], [place] [address]    Culture >=100,000 COLONIES/mL ENTEROCOCCUS GALLINARUM (A)  Final   Report Status PENDING  Incomplete    RADIOLOGY STUDIES/RESULTS: CT HEAD WO CONTRAST (5MM)  Result Date: 08/18/2021 CLINICAL DATA:  Altered mental status, migraine EXAM: CT HEAD WITHOUT CONTRAST TECHNIQUE: Contiguous axial images were obtained from the base of the skull through the vertex without intravenous contrast. RADIATION DOSE REDUCTION: This exam was performed according to the departmental dose-optimization program which includes automated exposure control, adjustment of the mA and/or kV according to patient size and/or use of iterative reconstruction technique. COMPARISON:  08/16/2021 FINDINGS: Brain: No evidence of acute infarction, hemorrhage, hydrocephalus, extra-axial collection or mass lesion/mass effect. Subcortical white matter and periventricular small vessel ischemic changes. Vascular: Intracranial atherosclerosis. Skull: Normal. Negative for fracture or focal lesion. Sinuses/Orbits: The visualized paranasal sinuses are essentially clear. The mastoid air cells are unopacified. Other: None. IMPRESSION: No acute intracranial abnormality. Small vessel ischemic changes. No interval change from recent CT. Electronically Signed   By: Julian Hy M.D.   On: 08/18/2021 17:59   CT CHEST  WO CONTRAST  Result Date: 08/18/2021 CLINICAL DATA:  Right breast and axillary pain. History of right breast cancer. No injury. Recent negative diagnostic mammogram in February. EXAM: CT CHEST WITHOUT  CONTRAST TECHNIQUE: Multidetector CT imaging of the chest was performed following the standard protocol without IV contrast. RADIATION DOSE REDUCTION: This exam was performed according to the departmental dose-optimization program which includes automated exposure control, adjustment of the mA and/or kV according to patient size and/or use of iterative reconstruction technique. COMPARISON:  Chest x-[place] dated Aug 16, 2021. FINDINGS: Cardiovascular: No significant vascular findings. Normal heart size. No pericardial effusion. No thoracic aortic aneurysm. Coronary, aortic arch, and branch vessel atherosclerotic vascular disease. Mediastinum/Nodes: No enlarged mediastinal or axillary lymph nodes. Thyroid gland, trachea, and esophagus demonstrate no significant findings. Lungs/Pleura: Trace right-greater-than-left pleural effusions. 5 mm solid pulmonary nodule in the posterior left lower lobe superior segment (series 5, image 77), unchanged since November 2021. 4 mm solid pulmonary nodules in the posterior right lower lobe (series 5, images 81 and 85), also unchanged since November 2021. Minimal dependent subsegmental atelectasis in both posterior lower lobes. No consolidation or pneumothorax. Upper Abdomen: No acute abnormality. Unchanged 2.9 cm left adrenal nodule, previously characterized as a benign adenoma. No follow-up imaging is recommended. Musculoskeletal: No acute or significant osseous findings. Mild elevation of the right hemidiaphragm. Postsurgical changes in the right breast from prior lumpectomy. Advanced bilateral glenohumeral osteoarthritis with small joint effusion and intra-articular body on the left. IMPRESSION: 1. Postsurgical changes of the right breast without acute abnormality. See recent negative  diagnostic mammogram results from February for follow-up recommendations. 2. Trace bilateral pleural effusions. 3. Few small pulmonary nodules in both lower lobes, unchanged since November 2021, benign. No further follow-up required. 4. Aortic Atherosclerosis (ICD10-I70.0). Electronically Signed   By: Titus Dubin M.D.   On: 08/18/2021 13:43   MR BRAIN WO CONTRAST  Result Date: 08/18/2021 CLINICAL DATA:  Altered mental status, mild cognitive impairment, weakness EXAM: MRI HEAD WITHOUT CONTRAST TECHNIQUE: Multiplanar, multiecho pulse sequences of the brain and surrounding structures were obtained without intravenous contrast. COMPARISON:  02/08/2020 MRI brain FINDINGS: Brain: No restricted diffusion to suggest acute or subacute infarct. No acute hemorrhage, mass, mass effect, or midline shift. No hydrocephalus or extra-axial collection. No hemosiderin deposition to suggest remote hemorrhage. Confluent and scattered T2 hyperintense signal in the periventricular white matter and pons, likely the sequela of moderate chronic small vessel ischemic disease, which have increased from the prior exam. Degree of global cerebral volume loss is within normal limits for age and is without lobar predominance. Vascular: Normal arterial flow voids. Skull and upper cervical spine: Normal marrow signal. Degenerative changes in the cervical spine. Sinuses/Orbits: No acute finding. Status post bilateral lens replacements. Other: The mastoids are well aerated. IMPRESSION: No acute intracranial process. No evidence of acute or subacute infarct. Electronically Signed   By: Merilyn Baba M.D.   On: 08/18/2021 22:56     LOS: 2 days   Oren Binet, MD  [facility]    To contact the attending provider between 7A-7P or the covering provider during after hours 7P-7A, please log into the web site [email or web address] and access using universal [place] password for that web site. If you do not have the password, please call  the hospital operator.  08/19/2021, 9:46 AM

## 2021-08-19 NOTE — Progress Notes (Signed)
EEG complete - results pending 

## 2021-08-19 NOTE — Progress Notes (Signed)
Physical Therapy Treatment Patient Details Name: [name] MRN: [id number] DOB: 1940-06-08 Today's Date: 08/19/2021   History of Present Illness 81 y/o female admitted secondary to [place]. CT neg for acute change. PMH includes CAD s/p stent, HTN, breast cancer, CVA, dementia, gout, DM.    PT Comments    Patient progressing towards physical therapy goals. Patient continues to require cues to maintain heel WB on R foot during mobility. Able to stand at sink x ~8 minutes to perform ADLs while offweighting R LE. Patient ambulated to/from bathroom with min guard. Able to stand from low bed surface and toilet with min guard. Limited distance due to wound on R foot and cueing required to maintain heel WB at times. D/c plan remains appropriate.     Recommendations for follow up therapy are one component of a multi-disciplinary discharge planning process, led by the attending physician.  Recommendations may be updated based on patient status, additional functional criteria and insurance authorization.  Follow Up Recommendations  Home health PT     Assistance Recommended at Discharge Frequent or constant Supervision/Assistance  Patient can return home with the following A little help with walking and/or transfers;A little help with bathing/dressing/bathroom;Assistance with cooking/housework;Help with stairs or ramp for entrance;Assist for transportation   Equipment Recommendations  None recommended by PT    Recommendations for Other Services       Precautions / Restrictions Precautions Precautions: Fall Restrictions Weight Bearing Restrictions: Yes RLE Weight Bearing: Partial weight bearing RLE Partial Weight Bearing Percentage or Pounds: heels only     Mobility  Bed Mobility Overal bed mobility: Needs Assistance Bed Mobility: Supine to Sit, Sit to Supine     Supine to sit: Supervision Sit to supine: Supervision        Transfers Overall transfer level: Needs  assistance Equipment used: Rolling Jerilyn Gillaspie (2 wheels) Transfers: Sit to/from Stand Sit to Stand: Min guard           General transfer comment: increased time to come into standing. min guard for safety    Ambulation/Gait Ambulation/Gait assistance: Min guard Gait Distance (Feet): 10 Feet (+10) Assistive device: Rolling Tyreck Bell (2 wheels) Gait Pattern/deviations: Step-to pattern, Decreased step length - right, Decreased step length - left, Decreased weight shift to right Gait velocity: Decreased     General Gait Details: Cues to maintain heel weightbearing on RLE. Min guard for Barrister's clerk    Modified Rankin (Stroke Patients Only)       Balance Overall balance assessment: Needs assistance Sitting-balance support: No upper extremity supported, Feet supported Sitting balance-Leahy Scale: Good     Standing balance support: Single extremity supported, During functional activity Standing balance-Leahy Scale: Fair Standing balance comment: able to stand at single with single UE support to perform ADLs per patient request                            Cognition Arousal/Alertness: Awake/alert Behavior During Therapy: WFL for tasks assessed/performed Overall Cognitive Status: History of cognitive impairments - at baseline                                 General Comments: Dementia at baseline. Following all commands and participating in conversation.        Exercises      General Comments  Pertinent Vitals/Pain Pain Assessment Pain Assessment: No/denies pain    Home Living                          Prior Function            PT Goals (current goals can now be found in the care plan section) Acute Rehab PT Goals Patient Stated Goal: to go home PT Goal Formulation: With patient Time For Goal Achievement: 08/31/21 Potential to Achieve Goals: Good Progress towards PT goals:  Progressing toward goals    Frequency    Min 3X/week      PT Plan Current plan remains appropriate    Co-evaluation              AM-PAC PT "6 Clicks" Mobility   Outcome Measure  Help needed turning from your back to your side while in a flat bed without using bedrails?: None Help needed moving from lying on your back to sitting on the side of a flat bed without using bedrails?: A Little Help needed moving to and from a bed to a chair (including a wheelchair)?: A Little Help needed standing up from a chair using your arms (e.g., wheelchair or bedside chair)?: A Little Help needed to walk in hospital room?: A Little Help needed climbing 3-5 steps with a railing? : A Lot 6 Click Score: 18    End of Session Equipment Utilized During Treatment: Gait belt Activity Tolerance: Patient limited by fatigue Patient left: in bed;with call bell/phone within reach;with nursing/sitter in room Nurse Communication: Mobility status PT Visit Diagnosis: Unsteadiness on feet (R26.81);Muscle weakness (generalized) (M62.81);Difficulty in walking, not elsewhere classified (R26.2)     Time: 4888-9169 PT Time Calculation (min) (ACUTE ONLY): 27 min  Charges:  $Gait Training: 8-22 mins $Therapeutic Activity: 8-22 mins                     [name] A. Gilford Rile PT, DPT Acute Rehabilitation Services Pager [phone] Office [phone]    Linna Hoff 08/19/2021, 5:17 PM

## 2021-08-19 NOTE — Consult Note (Addendum)
Cardiology Consultation:   Patient ID: [name] MRN: [id number]; DOB: [birth year]  Admit date: 08/16/2021 Date of Consult: 08/19/2021  PCP:  Cipriano Mile, NP   [facility] Providers Cardiologist:  Glenetta Hew, MD        Patient Profile:   [name] is a 81 y.o. female with a hx of breast cancer, labile BP with prior syncope, CAD, chronic lower extremity edema, DM 2 with peripheral neuropathy, CKD, DVT/PE and mild dementia who is being seen 08/19/2021 for the evaluation of possible syncope at the request of Dr. Sloan Leiter.  History of Present Illness:   [name] is a 81 year old female with past medical history of breast cancer, labile BP with prior syncope, CAD, chronic lower extremity edema, DM 2 with peripheral neuropathy, CKD, DVT/PE and mild dementia.  Patient had a BMS PCI to LAD in 2006 and underwent PTCA for in-stent restenosis in 2011.  Myoview in August 2013 was low risk.  She had a history of near syncope due to orthostatic symptoms.  Patient was last seen by [name] in October 2021 at which time she had intermittent twinges in her chest that was felt to be more musculoskeletal in nature.  Last echocardiogram obtained on 02/08/2020 showed EF 60 to 65%, grade 1 DD, RVSP 27 mmHg, trivial MR.  More recently, she was diagnosed with acute DVT of left popliteal vein in January 2023 and was started on Eliquis.  She was also treated with antibiotic for right lower extremity cellulitis.  CT imaging of the right foot did not show any evidence of osteomyelitis or abscess.  Due to Charcot's foot, patient's functional ability is quite limited, this has created significant distress for the patient as she was previously quite independent and active.  According to the patient's family member, she has been feeling poorly with intermittent weakness, chest pressure and malaise for the past month.  On 08/16/2021, she had an episode where she became unresponsive and rigid.  According to  family member, she was laying in bed at the time.  When the family member came in, her body was leaning to the side and she had very shallow breathing.  Subsequently, she lost consciousness and that the family member was unable to observe any breathing or feel pulse, patient's arm was rigid, therefore CPR was started.  Only 30 chest compression was given before the patient came to.  She was subsequently admitted to [facility] for further evaluation.  On arrival, creatinine was elevated at 1.93 from baseline of 1.4.  Potassium 3.3.  White blood cell count 13.2.  Troponin negative.  Chest x-Cutrone showed no acute issue.  Urinalysis showed leukocyte and bacteria.  Urine culture showed Enterococcus there was concern of urine tract infection and dehydration, patient was given ceftriaxone and IV fluid.  Her renal function improved.  Echocardiogram has been ordered and currently pending.  Neurology consulted for possible syncope.  EEG today showed no seizure or epileptiform discharges.  Cardiology service was also consulted for syncope.   Past Medical History:  Diagnosis Date   Acute blood loss anemia 02/19/2020   Acute renal failure superimposed on stage 4 chronic kidney disease ([place]) 02/21/2020   Acute respiratory failure with hypoxia ([place]) 02/21/2020   Anemia    have received iron infusions   Anxiety    Arthritis     osteoarthritis   Breast cancer ([place])    Breast cancer in female [facility])    Bilateral   CAD S/P percutaneous coronary  angioplasty 2007; March 2011   [employer] 3.0 mm 20 mm postdilated 3.6 mm in early mid LAD; status post ISR Cutting Balloon PTCA and March 11 along with PCI of distal mid lesion with a 3.0 mm 12 mm MultiLink vision BMS; the proximal stent causes jailing of SP1 and SP2 with ostial 70-80% lesions   Cancer ([place])    Melanoma, Squamous cell Carcinoma   Charcot's joint of foot, right    Charcot's joint of left foot    Chronic diastolic CHF (congestive heart failure),  NYHA class 2 ([place]) 02/28/2018   Chronic kidney disease    stage 2    Closed fracture of neck of left femur ([place]) 02/08/2020   Coronary artery disease    Dementia ([place])    mild   Diabetes mellitus type 2 with neurological manifestations ([place])    Diabetes mellitus without complication (HCC)    Dyslipidemia, goal LDL below 70    Foot pain, bilateral    Full dentures    Genetic testing 12/03/2016   Germline genetic testing was performed through [employer]'s Common Hereditary Cancers Panel + [employer]'s Melanoma Panel. This custom panel includes analysis of the following 51 genes: APC, ATM, AXIN2, BAP1, BARD1, BMPR1A, BRCA1, BRCA2, BRIP1, CDH1, CDK4, CDKN2A, CHEK2, CTNNA1, DICER1, EPCAM, GREM1, HOXB13, KIT, MEN1, MITF, MLH1, MSH2, MSH3, MSH6, MUTYH, NBN, NF1, NTHL1, PALB2, PDGFRA, PMS2, POLD1, POL   GERD (gastroesophageal reflux disease)    GIB (gastrointestinal bleeding)    Gout    Headache(784.0)    Hip fracture ([place]) 02/08/2020   History of hematuria    Followed by Dr. Gaynelle Arabian   Hypertension, essential, benign    Hypotension    Migraine    Mild cognitive impairment    Peripheral neuropathy    Personal history of radiation therapy    Post herpetic neuralgia    Restless leg syndrome    Stroke ([place])    TIA (transient ischemic attack)    multiple in the past   TIA (transient ischemic attack)    Wears glasses     Past Surgical History:  Procedure Laterality Date   ABDOMINAL HYSTERECTOMY     ANKLE FUSION Right 02/22/2018   Procedure: RIGHT SUBTALAR AND TALONAVICULAR FUSION;  Surgeon: Newt Minion, MD;  Location: [place];  Service: Orthopedics;  Laterality: Right;   APPENDECTOMY     BIOPSY  02/19/2020   Procedure: BIOPSY;  Surgeon: Otis Brace, MD;  Location: [place] [facility];  Service: Gastroenterology;;   BLADDER SUSPENSION     BREAST LUMPECTOMY Bilateral 2018   BREAST LUMPECTOMY WITH RADIOACTIVE SEED AND SENTINEL LYMPH NODE BIOPSY Bilateral 12/14/2016   Procedure: BILATERAL  BREAST LUMPECTOMY WITH BILATERAL  RADIOACTIVE SEED AND BILATERAL AXILLARY  SENTINEL LYMPH NODE BIOPSY ERAS PATHWAY;  Surgeon: Alphonsa Overall, MD;  Location: [place];  Service: General;  Laterality: Bilateral;  [place]  02/24/2006   85% stenosis in the proximal portion of LAD-arrangements made for PCI on 02/25/2006   CARDIAC CATHETERIZATION  02/25/2006   80% LAD lesion stented with a 3x85m Liberte stent resulting in reduction of 80% lesion to 0% residual   CARDIAC CATHETERIZATION  04/26/2006   Medical management   CARDIAC CATHETERIZATION  06/24/2009   60-70% re-stenosis in the proximal LAD. A 3.25x15 cutting balloon, 3 inflations - 14atm-38sec, 13atm-39sec, and 12atm-40sec reduced to less than 10%. 60% stenosis of the mid/distal LAD stented with a 3x120mMultilink stent.   CARDIAC CATHETERIZATION  07/09/2009   Medical management  CARDIAC CATHETERIZATION     CAROTID DOPPLER  06/24/2009   40-59% R ICA stenosis. No significant ICA stenosis noted   CHOLECYSTECTOMY     CORONARY ANGIOPLASTY     DILATION AND CURETTAGE OF UTERUS     ESOPHAGOGASTRODUODENOSCOPY N/A 02/26/2020   Procedure: ESOPHAGOGASTRODUODENOSCOPY (EGD);  Surgeon: Clarene Essex, MD;  Location: [place];  Service: Endoscopy;  Laterality: N/A;   ESOPHAGOGASTRODUODENOSCOPY (EGD) WITH PROPOFOL N/A 02/19/2020   Procedure: ESOPHAGOGASTRODUODENOSCOPY (EGD) WITH PROPOFOL;  Surgeon: Otis Brace, MD;  Location: [place];  Service: Gastroenterology;  Laterality: N/A;   ESOPHAGOGASTRODUODENOSCOPY (EGD) WITH PROPOFOL Left 02/23/2020   Procedure: ESOPHAGOGASTRODUODENOSCOPY (EGD) WITH PROPOFOL;  Surgeon: Arta Silence, MD;  Location: ;  Service: Endoscopy;  Laterality: Left;   HIP ARTHROPLASTY Left 02/10/2020   Procedure: ARTHROPLASTY BIPOLAR HIP (HEMIARTHROPLASTY) POSTERIOR LATERAL;  Surgeon: [name], MD;  Location: [place];  Service: Orthopedics;  Laterality: Left;   JOINT REPLACEMENT  Left    LESION REMOVAL Right 03/11/2015   Procedure:  EXCISION OF RIGHT PRE TIBIAL LESION;  Surgeon: Judeth Horn, MD;  Location: [place];  Service: General;  Laterality: Right;   MASS EXCISION Left 06/10/2014   Procedure: EXCISION LEFT LOWER LEG LESION;  Surgeon: Doreen Salvage, MD;  Location: [place];  Service: General;  Laterality: Left;   MASS EXCISION Left 09/05/2015   Procedure: EXCISION OF LEFT FOREARM  MASS;  Surgeon: Judeth Horn, MD;  Location: [place];  Service: General;  Laterality: Left;   NM MYOVIEW LTD  10/2011   No ischemia or infarction.  Normal EF.  LOW RISK. (Short bursts of 5 beats NSVT during recovery otherwise normal)   POLYPECTOMY  02/19/2020   Procedure: POLYPECTOMY;  Surgeon: Otis Brace, MD;  Location: [place] [facility];  Service: Gastroenterology;;   SHOULDER ARTHROSCOPY  10/15   SHOULDER ARTHROSCOPY  1995   left   TEE WITHOUT CARDIOVERSION N/A 08/03/2017   Procedure: TRANSESOPHAGEAL ECHOCARDIOGRAM (TEE);  Surgeon: Skeet Latch, MD;  Location: [place];  Service: Cardiovascular;; EF 60-65% with no wall motion normalities.  No atrial thrombus noted.  Lightly findings consistent with a lipomatous hypertrophy of the atrial septum.    TENDON REPAIR Left 05/12/2016   Procedure: Left Anterior Tibial Tendon Reconstruction;  Surgeon: Newt Minion, MD;  Location: [place];  Service: Orthopedics;  Laterality: Left;   TOTAL KNEE ARTHROPLASTY  2005   left   TRANSESOPHAGEAL ECHOCARDIOGRAM  08/03/2016   : EF 60-65% no wall motion normalities.  No atrial thrombus.  Lipomatous hypertrophy of the atrial septum.  No mass noted.   TRANSESOPHAGEAL ECHOCARDIOGRAM  07/2017   EF 60 to 65%.  No R WMA.  No atrial thrombus noted.  Lipomatous IAS.   TRANSTHORACIC ECHOCARDIOGRAM  02/25/2017   :EF 65-70%.  GR 1 DD.  No or WMA.  Moderate aortic calcification/sclerosis but no stenosis.   TRANSTHORACIC ECHOCARDIOGRAM  07/2017   Vigorous LV  function.  EF 65-70%.  Mild diastolic dysfunction with no RWMA. GR 1 DD.  Mild aortic sclerosis/no stenosis.  Questionable right atrial mass discounted with TEE)    WRIST ARTHROPLASTY  2010   cancer lt wrist     Home Medications:  Prior to Admission medications   Medication Sig Start Date End Date Taking? Authorizing Provider  bumetanide (BUMEX) 2 MG tablet Take 4 mg by mouth daily.   Yes [provider]  COLCRYS 0.6 MG tablet TAKE 1 TABLET DAILY AS NEEDED FOR GOUT FLARE. Patient taking  differently: Take 0.6 mg by mouth daily as needed (gout). 12/27/18  Yes Newt Minion, MD  diazepam (VALIUM) 5 MG tablet Take 5 mg by mouth 3 (three) times daily as needed for anxiety.   Yes [provider]  ELIQUIS 5 MG TABS tablet Take 5 mg by mouth 2 (two) times daily. 07/13/21  Yes [provider]  febuxostat (ULORIC) 40 MG tablet Take 40 mg by mouth daily. 12/31/19  Yes [provider]  glipiZIDE (GLUCOTROL) 5 MG tablet Take 2.5 mg by mouth daily before breakfast.   Yes [provider]  JANUVIA 25 MG tablet Take 25 mg by mouth daily. 12/26/19  Yes [provider]  Javier Docker Oil 500 MG CAPS Take 500 mg by mouth daily.   Yes [provider]  metolazone (ZAROXOLYN) 2.5 MG tablet Take 2.5 mg by mouth daily. 30 minutes before Bumetanide Mon. & Thurs.   Yes [provider]  metoprolol succinate (TOPROL-XL) 25 MG 24 hr tablet Take 25 mg by mouth at bedtime.  12/27/19  Yes [provider]  mirtazapine (REMERON) 15 MG tablet Take 15 mg by mouth at bedtime.   Yes [provider]  pantoprazole (PROTONIX) 40 MG tablet Take 1 tablet (40 mg total) by mouth 2 (two) times daily before a meal. 03/02/20  Yes Dahal, Marlowe Aschoff, MD  rosuvastatin (CRESTOR) 20 MG tablet Take 20 mg by mouth daily.   Yes [provider]  acetaminophen-codeine (TYLENOL #3) 300-30 MG tablet Take 1 tablet by mouth every 4 (four) hours as needed for moderate pain.     [provider]  ALPRAZolam Duanne Moron) 0.25 MG tablet Take 1-2 tablets (0.25-0.5 mg total) by mouth 2 (two) times daily as needed for anxiety. 04/17/21   Truitt Merle, MD  BIOTIN PO Take 1 tablet by mouth daily. Patient not taking: Reported on 08/16/2021    [provider]  Blood Glucose Monitoring Suppl (FREESTYLE LITE) w/Device KIT use as directed 07/21/20   Kayleen Memos, DO  Cholecalciferol (VITAMIN D) 50 MCG (2000 UT) tablet Take 2,000 Units by mouth daily. Patient not taking: Reported on 08/16/2021    [provider]  clopidogrel (PLAVIX) 75 MG tablet Take 75 mg by mouth daily. Patient not taking: Reported on 08/16/2021    [provider]  Coenzyme Q10 (COQ-10 PO) Take 1 tablet by mouth daily. Patient not taking: Reported on 08/16/2021    [provider]  Cranberry 500 MG TABS Take 500 mg by mouth every evening. Patient not taking: Reported on 08/16/2021    [provider]  CRANBERRY-CALCIUM PO Take 1 tablet by mouth daily. Patient not taking: Reported on 08/16/2021    [provider]  exemestane (AROMASIN) 25 MG tablet Take 1 tablet (25 mg total) by mouth daily after breakfast. TAKE 1 TABLET DAILY AFTER BREAKFAST 04/17/21   Truitt Merle, MD  gabapentin (NEURONTIN) 300 MG capsule Take 300 mg by mouth at bedtime. 12/18/19   [provider]  glucose blood (FREESTYLE LITE) test strip Test blood sugars up to 4 times daily 07/21/20   Kayleen Memos, DO  insulin glargine (LANTUS) 100 UNIT/ML Solostar Pen Inject 5 Units into the skin daily. 07/21/20   Kayleen Memos, DO  Insulin Pen Needle (PENTIPS) 32G X 4 MM MISC Use as directed 07/21/20   Kayleen Memos, DO  Insulin Pen Needle 32G X 4 MM MISC 5 Units by Does not apply route in the morning and at bedtime. 07/21/20  Irene Pap N, DO  insulin regular (NOVOLIN R) 100 units/mL injection Inject into the skin 3 (three) times daily before meals. Started last week 4/28: Sliding scale before meals:  <150 none,                                                                           151-199 - 2 units                                                                            200-249 - 4 units                                                                            250-299 - 6 units                                                                            300-349 - 8 units                                                                            350> 10 units    [provider]  Lancets (FREESTYLE) lancets Test blood sugars up to 4 times daily 07/21/20   Kayleen Memos, DO  Methylfol-Algae-B12-Acetylcyst (CEREFOLIN NAC PO) Take 1 tablet by mouth in the morning.    [provider]  Methylfol-Methylcob-Acetylcyst (CEREFOLIN NAC) 6-2-600 MG TABS TAKE 1 TABLET BY MOUTH EVERY DAY Patient not taking: Reported on 08/16/2021 06/07/19   Garvin Fila, MD  Multiple Vitamin (MULTIVITAMIN WITH MINERALS) TABS tablet Take 1 tablet by mouth daily. Patient not taking: Reported on 08/16/2021 03/03/20   Terrilee Croak, MD  nitroGLYCERIN (NITROLINGUAL) 0.4 MG/SPRAY spray Place 1 spray under the tongue every 5 (five) minutes x 3 doses as needed for chest pain.    [provider]  potassium chloride SA (KLOR-CON) 20 MEQ tablet Take 20 mEq by mouth 2 (two) times daily. Patient not taking: Reported on 08/16/2021    [provider]  pramipexole (MIRAPEX) 0.25 MG tablet TAKE 1 TO 2 TABLETS AS INSTRUCTED, 1 TABLET AT 4:00 P.M.  AND 2 TABLETS AT BEDTIME Patient taking differently: See admin instructions. Pt says she is taking 3 tablets po at bedtime 09/27/19   Frann Rider, NP    Inpatient Medications: Scheduled Meds:  apixaban  5 mg Oral BID   bumetanide  2 mg Oral Daily   divalproex  500 mg Oral Daily   exemestane  25 mg Oral QPC breakfast   febuxostat  40 mg Oral Daily   gabapentin  300 mg Oral QHS   insulin aspart  0-9 Units Subcutaneous TID WC   insulin glargine-yfgn  10  Units Subcutaneous QHS   metoprolol succinate  25 mg Oral QHS   mirtazapine  15 mg Oral QHS   pantoprazole  40 mg Oral BID AC   potassium chloride  40 mEq Oral Daily   pramipexole  0.25 mg Oral QHS   rosuvastatin  20 mg Oral Daily   sodium chloride flush  3 mL Intravenous Q12H   Continuous Infusions:  ampicillin-sulbactam (UNASYN) IV 1.5 g (08/19/21 0937)   PRN Meds: acetaminophen **OR** acetaminophen, ALPRAZolam, polyethylene glycol  Allergies:    Allergies  Allergen Reactions   Nsaids Nausea And Vomiting and Palpitations   Reglan [Metoclopramide] Other (See Comments)    Seizures    Tramadol Other (See Comments)    Seizures    Ultram [Tramadol] Other (See Comments)    Pt has seizure activity with this medication   Lipitor [Atorvastatin] Other (See Comments)    Bone and muscle pain   Lipitor [Atorvastatin] Swelling and Other (See Comments)    Pain    Lyrica [Pregabalin] Other (See Comments)    Weight gain, extremity swelling   Lyrica [Pregabalin] Swelling   Reglan [Metoclopramide] Other (See Comments)    Tardive dyskinesia; paradoxical reaction not relieved by benadryl   Nsaids Swelling    SWELLING REACTION UNSPECIFIED    Urecholine [Bethanechol]     Palpitations and nausea    Social History:   Social History   Socioeconomic History   Marital status: Married    Spouse name: [name]   Number of children: 3   Years of education: 11   Highest education level: Not on file  Occupational History   Occupation: retired    Fish farm manager: RETIRED  Tobacco Use   Smoking status: Never   Smokeless tobacco: Never  Vaping Use   Vaping Use: Never used  Substance and Sexual Activity   Alcohol use: Never   Drug use: Never   Sexual activity: Never  Other Topics Concern   Not on file  Social History Narrative   ** Merged History Encounter **       She is a married mother of 55, grandmother of 2.  Usually very active and out about the house.  But currently over last 6-8 weeks has  been incapacitated.  She never smoked and does not drink alcohol. 10 th grade   Social Determinants of Health   Financial Resource Strain: Not on file  Food Insecurity: Not on file  Transportation Needs: Not on file  Physical Activity: Not on file  Stress: Not on file  Social Connections: Not on file  Intimate Partner Violence: Not on file    Family History:    Family History  Problem Relation Age of Onset   Diabetes Mother    Kidney disease Mother    Diabetes Sister    Diabetes Brother    Hypertension Mother    Hypertension Father    Emphysema Father    Heart  attack Father    Heart disease Father    Arthritis Sister    Diabetes Sister    Hypertension Sister    Breast cancer Sister 47       treated with mastectomy   Cervical cancer Sister 34   Hypertension Brother    Hyperlipidemia Brother    Diabetes Brother    Stroke Brother    Diabetes Sister    Hypertension Sister    Stomach cancer Sister 90   Hypertension Brother    ALS Brother        d.62s   Breast cancer Daughter 57       triple negative treated with lumpectomy, chemo, radiation   Heart attack Daughter 64       d.45   COPD Daughter    Cancer Paternal Aunt        unspecified type   Cancer Paternal Uncle        unspecified type   Cancer Paternal Uncle        unspecified type     ROS:  Please see the history of present illness.   All other ROS reviewed and negative.     Physical Exam/Data:   Vitals:   08/18/21 1700 08/18/21 2020 08/19/21 0540 08/19/21 0905  BP: 135/80 (!) 147/77 (!) 137/97 126/82  Pulse: 67 64 69 64  Resp: _0 Temp:  98.4 F (36.9 C) 97.9 F (36.6 C) 98.4 F (36.9 C)  TempSrc:  Oral Oral Oral  SpO2: 97% 98% 94% 100%  Weight:   78.6 kg   Height:        Intake/Output Summary (Last 24 hours) at 08/19/2021 1207 Last data filed at 08/19/2021 0038 Gross per 24 hour  Intake 780 ml  Output 1100 ml  Net -320 ml      08/19/2021    5:40 AM 08/18/2021    5:00 AM  08/17/2021    7:49 AM  Last 3 Weights  Weight (lbs) 173 lb 4.5 oz 172 lb 13.5 oz 174 lb  Weight (kg) 78.6 kg 78.4 kg 78.926 kg     Body mass index is 27.14 kg/m.  General:  Well nourished, well developed, in no acute distress HEENT: normal Neck: no JVD Vascular: No carotid bruits; Distal pulses 2+ bilaterally Cardiac:  normal S1, S2; RRR; no murmur  Lungs:  clear to auscultation bilaterally, no wheezing, rhonchi or rales  Abd: soft, nontender, no hepatomegaly  Ext: no edema Musculoskeletal:  No deformities, BUE and BLE strength normal and equal Skin: warm and dry  Neuro:  CNs 2-12 intact, no focal abnormalities noted Psych:  Normal affect   EKG:  The EKG was personally reviewed and demonstrates: Normal sinus rhythm, no significant ST-T wave changes Telemetry:  Telemetry was personally reviewed and demonstrates: Normal sinus rhythm with occasional PVCs, no significant ventricular ectopy or significant bradycardia or pauses.  Relevant CV Studies:  Echo 02/08/2020  1. Left ventricular ejection fraction, by estimation, is 60 to 65%. The  left ventricle has normal function. The left ventricle has no regional  wall motion abnormalities. There is mild left ventricular hypertrophy.  Left ventricular diastolic parameters  are consistent with Grade I diastolic dysfunction (impaired relaxation).   2. Right ventricular systolic function is normal. The right ventricular  size is normal. There is normal pulmonary artery systolic pressure. The  estimated right ventricular systolic pressure is 68.1 mmHg.   3. The mitral valve is normal in structure. Trivial mitral valve  regurgitation.  No evidence of mitral stenosis.   4. The aortic valve is tricuspid. Aortic valve regurgitation is not  visualized. No aortic stenosis is present.   5. The inferior vena cava is normal in size with <50% respiratory  variability, suggesting right atrial pressure of 8 mmHg.   Laboratory Data:  High Sensitivity  Troponin:   Recent Labs  Lab 08/16/21 2010 08/17/21 0024  TROPONINIHS 12 10     Chemistry Recent Labs  Lab 08/17/21 0024 08/17/21 0505 08/18/21 0259 08/19/21 0439  NA  --  129* 130* 134*  K  --  3.6 3.6 3.5  CL  --  88* 92* 96*  CO2  --  _0 GLUCOSE  --  208* 184* 198*  BUN  --  30* 23 14  CREATININE  --  1.96* 1.42* 1.42*  CALCIUM  --  9.1 9.0 9.4  MG 2.1  --   --   --   GFRNONAA  --  25* 37* 37*  ANIONGAP  --  _1 Recent Labs  Lab 08/16/21 2010 08/17/21 0505  PROT 7.4 6.2*  ALBUMIN 3.8 3.1*  AST 34 25  ALT 22 23  ALKPHOS 74 62  BILITOT 0.9 0.6   Lipids  Recent Labs  Lab 08/19/21 0439  CHOL 101  TRIG 119  HDL 30*  LDLCALC 47  CHOLHDL 3.4    Hematology Recent Labs  Lab 08/16/21 2010 08/16/21 2017 08/17/21 0505 08/18/21 0259  WBC 13.2*  --  10.4 8.1  RBC 4.92  --  4.15 4.04  HGB 14.9 15.3* 12.6 12.0  HCT 42.7 45.0 36.7 36.1  MCV 86.8  --  88.4 89.4  MCH 30.3  --  30.4 29.7  MCHC 34.9  --  34.3 33.2  RDW 13.4  --  13.6 13.5  PLT 238  --  188 163   Thyroid  Recent Labs  Lab 08/16/21 2053  TSH 1.195    BNP Recent Labs  Lab 08/16/21 2011  BNP 40.5    DDimer No results for input(s): DDIMER in the last 168 hours.   Radiology/Studies:  CT HEAD WO CONTRAST (5MM)  Result Date: 08/18/2021 CLINICAL DATA:  Altered mental status, migraine EXAM: CT HEAD WITHOUT CONTRAST TECHNIQUE: Contiguous axial images were obtained from the base of the skull through the vertex without intravenous contrast. RADIATION DOSE REDUCTION: This exam was performed according to the departmental dose-optimization program which includes automated exposure control, adjustment of the mA and/or kV according to patient size and/or use of iterative reconstruction technique. COMPARISON:  08/16/2021 FINDINGS: Brain: No evidence of acute infarction, hemorrhage, hydrocephalus, extra-axial collection or mass lesion/mass effect. Subcortical white matter and periventricular  small vessel ischemic changes. Vascular: Intracranial atherosclerosis. Skull: Normal. Negative for fracture or focal lesion. Sinuses/Orbits: The visualized paranasal sinuses are essentially clear. The mastoid air cells are unopacified. Other: None. IMPRESSION: No acute intracranial abnormality. Small vessel ischemic changes. No interval change from recent CT. Electronically Signed   By: Julian Hy M.D.   On: 08/18/2021 17:59   CT Head Wo Contrast  Result Date: 08/16/2021 CLINICAL DATA:  Altered mental status. EXAM: CT HEAD WITHOUT CONTRAST TECHNIQUE: Contiguous axial images were obtained from the base of the skull through the vertex without intravenous contrast. RADIATION DOSE REDUCTION: This exam was performed according to the departmental dose-optimization program which includes automated exposure control, adjustment of the mA and/or kV according to patient size and/or use of iterative reconstruction technique. COMPARISON:  CT head dated 11/26/2019. FINDINGS: Brain: No evidence of acute infarction, hemorrhage, hydrocephalus, extra-axial collection or mass lesion/mass effect. Periventricular white matter hypoattenuation likely represents chronic small vessel ischemic disease. Vascular: There are vascular calcifications in the carotid siphons. Skull: Normal. Negative for fracture or focal lesion. Sinuses/Orbits: No acute finding. Other: None. IMPRESSION: No acute intracranial process. Electronically Signed   By: Zerita Boers M.D.   On: 08/16/2021 20:36   CT CHEST WO CONTRAST  Result Date: 08/18/2021 CLINICAL DATA:  Right breast and axillary pain. History of right breast cancer. No injury. Recent negative diagnostic mammogram in February. EXAM: CT CHEST WITHOUT CONTRAST TECHNIQUE: Multidetector CT imaging of the chest was performed following the standard protocol without IV contrast. RADIATION DOSE REDUCTION: This exam was performed according to the departmental dose-optimization program which  includes automated exposure control, adjustment of the mA and/or kV according to patient size and/or use of iterative reconstruction technique. COMPARISON:  Chest x-[place] dated Aug 16, 2021. FINDINGS: Cardiovascular: No significant vascular findings. Normal heart size. No pericardial effusion. No thoracic aortic aneurysm. Coronary, aortic arch, and branch vessel atherosclerotic vascular disease. Mediastinum/Nodes: No enlarged mediastinal or axillary lymph nodes. Thyroid gland, trachea, and esophagus demonstrate no significant findings. Lungs/Pleura: Trace right-greater-than-left pleural effusions. 5 mm solid pulmonary nodule in the posterior left lower lobe superior segment (series 5, image 77), unchanged since November 2021. 4 mm solid pulmonary nodules in the posterior right lower lobe (series 5, images 81 and 85), also unchanged since November 2021. Minimal dependent subsegmental atelectasis in both posterior lower lobes. No consolidation or pneumothorax. Upper Abdomen: No acute abnormality. Unchanged 2.9 cm left adrenal nodule, previously characterized as a benign adenoma. No follow-up imaging is recommended. Musculoskeletal: No acute or significant osseous findings. Mild elevation of the right hemidiaphragm. Postsurgical changes in the right breast from prior lumpectomy. Advanced bilateral glenohumeral osteoarthritis with small joint effusion and intra-articular body on the left. IMPRESSION: 1. Postsurgical changes of the right breast without acute abnormality. See recent negative diagnostic mammogram results from February for follow-up recommendations. 2. Trace bilateral pleural effusions. 3. Few small pulmonary nodules in both lower lobes, unchanged since November 2021, benign. No further follow-up required. 4. Aortic Atherosclerosis (ICD10-I70.0). Electronically Signed   By: Titus Dubin M.D.   On: 08/18/2021 13:43   MR BRAIN WO CONTRAST  Result Date: 08/18/2021 CLINICAL DATA:  Altered mental status,  mild cognitive impairment, weakness EXAM: MRI HEAD WITHOUT CONTRAST TECHNIQUE: Multiplanar, multiecho pulse sequences of the brain and surrounding structures were obtained without intravenous contrast. COMPARISON:  02/08/2020 MRI brain FINDINGS: Brain: No restricted diffusion to suggest acute or subacute infarct. No acute hemorrhage, mass, mass effect, or midline shift. No hydrocephalus or extra-axial collection. No hemosiderin deposition to suggest remote hemorrhage. Confluent and scattered T2 hyperintense signal in the periventricular white matter and pons, likely the sequela of moderate chronic small vessel ischemic disease, which have increased from the prior exam. Degree of global cerebral volume loss is within normal limits for age and is without lobar predominance. Vascular: Normal arterial flow voids. Skull and upper cervical spine: Normal marrow signal. Degenerative changes in the cervical spine. Sinuses/Orbits: No acute finding. Status post bilateral lens replacements. Other: The mastoids are well aerated. IMPRESSION: No acute intracranial process. No evidence of acute or subacute infarct. Electronically Signed   By: Merilyn Baba M.D.   On: 08/18/2021 22:56   DG Chest Portable 1 View  Result Date: 08/16/2021 CLINICAL DATA:  Altered mental status. EXAM: PORTABLE CHEST 1 VIEW COMPARISON:  Chest radiograph dated 05/12/2021. FINDINGS: The patient is rotated to the right. The heart size and mediastinal contours are within normal limits. Vascular calcifications are seen in the aortic arch. both lungs are clear. The visualized skeletal structures are unremarkable. IMPRESSION: No active disease. Aortic Atherosclerosis (ICD10-I70.0). Electronically Signed   By: Zerita Boers M.D.   On: 08/16/2021 20:25   EEG adult  Result Date: 08/19/2021 Lora Havens, MD     08/19/2021 11:15 AM Patient Name: Laikynn Pollio MRN: [id number] Epilepsy Attending: Lora Havens Referring Physician/Provider: Lorenza Chick, MD Date: 08/19/2021 Duration: 21.45 mins Patient history: 81 year old woman who presented with an episode of loss of consciousness that is concerning for seizure activity.  EEG to evaluate for seizure. Level of alertness: Awake, asleep AEDs during EEG study: VPA, GBP Technical aspects: This EEG study was done with scalp electrodes positioned according to the 10-20 International system of electrode placement. Electrical activity was acquired at a sampling rate of _0  and reviewed with a high frequency filter of _1  and a low frequency filter of _2 . EEG data were recorded continuously and digitally stored. Description: The posterior dominant rhythm consists of 8-9 Hz activity of moderate voltage (25-35 uV) seen predominantly in posterior head regions, symmetric and reactive to eye opening and eye closing. Sleep was characterized by vertex waves, sleep spindles (12 to 14 Hz), maximal frontocentral region. Hyperventilation and photic stimulation were not performed.   IMPRESSION: This study is within normal limits. No seizures or epileptiform discharges were seen throughout the recording. Priyanka Barbra Sarks     Assessment and Plan:   Questionable syncope  -Occurred while lying in bed, family did not observe breathing or pulse, therefore started on brief CPR.  Only 30 chest compressions was done before the patient came to and asked family member to stop.  -EEG negative.  Neurology following.  CT and MRI of head showed no intracranial abnormality.  CT of chest showed trace bilateral pleural effusion, few small pulmonary nodules in both lower lobe, unchanged when compared to 2021, overall considered benign and no follow-up needed.  -Discussed with MD.  Will review echocardiogram as it is done.  Plan for outpatient 2-week ZIO monitor.  Acute on chronic renal insufficiency: Improved with hydration  - Creatinine 1.9 on arrival, improved to 1.42 after hydration  Urinary tract infection: Urine culture  positive for Enterococcus.  Received IV ceftriaxone.  CAD: Patient complains of intermittent chest pressure recently, however EKG showed no acute changes.  Symptom does not occur with physical exertion.  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  Started on Eliquis   Risk Assessment/Risk Scores:     HEAR Score (for undifferentiated chest pain):  HEAR Score: 4          For questions or updates, please contact [place] Please consult [email or web address] for contact info under    Hilbert Corrigan, [place]  08/19/2021 12:07 PM  Patient examined chart reviewed discussed care with patient, grand daughter and PA Exam with elderly female No murmur lungs clear abdomen benign improved only plus one edema with stasis and Charcot foot deformities bilaterally She does not really ambulate due to latter. She has no angina to my interview Her ECG is benign and her EF has been normal Admitted with UTI and electrolyte abnormalities Doubt significant cardiac event. Updated TTE pending Have arranged for Zio monitor to r/o arrhythmia No need for inpatient stress testing RX UTI Cr improved with  hydration Will arrange outpatient f/u with Dr Robyne Askew MD [facility]

## 2021-08-19 NOTE — Care Management Important Message (Signed)
Important Message  Patient Details  Name: [name] MRN: [id number] Date of Birth: Aug 05, 1940   Medicare Important Message Given:  Yes     Orbie Pyo 08/19/2021, 2:08 PM

## 2021-08-19 NOTE — Progress Notes (Deleted)
STROKE TEAM PROGRESS NOTE   INTERVAL HISTORY Her daughter is at the bedside.  Patient was resting in bed with her daughter, when she tried to get up into her wheelchair she had what appears to be a syncopal epsiode. Her daughter states that she believes she stopped breathing and lost a pulse, she initiated chest compression and called EMS. Dr. Leonie Man has seen her outpatient and she requested a visit from him inpatient.   Vitals:   08/18/21 1700 08/18/21 2020 08/19/21 0540 08/19/21 0905  BP: 135/80 (!) 147/77 (!) 137/97 126/82  Pulse: 67 64 69 64  Resp: '18 18 17 18  '$ Temp:  98.4 F (36.9 C) 97.9 F (36.6 C) 98.4 F (36.9 C)  TempSrc:  Oral Oral Oral  SpO2: 97% 98% 94% 100%  Weight:   78.6 kg   Height:       CBC:  Recent Labs  Lab 08/16/21 2010 08/16/21 2017 08/17/21 0505 08/18/21 0259  WBC 13.2*  --  10.4 8.1  NEUTROABS 9.4*  --   --  5.2  HGB 14.9   < > 12.6 12.0  HCT 42.7   < > 36.7 36.1  MCV 86.8  --  88.4 89.4  PLT 238  --  188 163   < > = values in this interval not displayed.   Basic Metabolic Panel:  Recent Labs  Lab 08/17/21 0024 08/17/21 0505 08/18/21 0259 08/19/21 0439  NA  --    < > 130* 134*  K  --    < > 3.6 3.5  CL  --    < > 92* 96*  CO2  --    < > 27 28  GLUCOSE  --    < > 184* 198*  BUN  --    < > 23 14  CREATININE  --    < > 1.42* 1.42*  CALCIUM  --    < > 9.0 9.4  MG 2.1  --   --   --    < > = values in this interval not displayed.   Lipid Panel: No results for input(s): CHOL, TRIG, HDL, CHOLHDL, VLDL, LDLCALC in the last 168 hours. HgbA1c:  Recent Labs  Lab 08/18/21 0259  HGBA1C 9.3*   Urine Drug Screen: No results for input(s): LABOPIA, COCAINSCRNUR, LABBENZ, AMPHETMU, THCU, LABBARB in the last 168 hours.  Alcohol Level No results for input(s): ETH in the last 168 hours.  IMAGING past 24 hours CT HEAD WO CONTRAST (5MM)  Result Date: 08/18/2021 CLINICAL DATA:  Altered mental status, migraine EXAM: CT HEAD WITHOUT CONTRAST TECHNIQUE:  Contiguous axial images were obtained from the base of the skull through the vertex without intravenous contrast. RADIATION DOSE REDUCTION: This exam was performed according to the departmental dose-optimization program which includes automated exposure control, adjustment of the mA and/or kV according to patient size and/or use of iterative reconstruction technique. COMPARISON:  08/16/2021 FINDINGS: Brain: No evidence of acute infarction, hemorrhage, hydrocephalus, extra-axial collection or mass lesion/mass effect. Subcortical white matter and periventricular small vessel ischemic changes. Vascular: Intracranial atherosclerosis. Skull: Normal. Negative for fracture or focal lesion. Sinuses/Orbits: The visualized paranasal sinuses are essentially clear. The mastoid air cells are unopacified. Other: None. IMPRESSION: No acute intracranial abnormality. Small vessel ischemic changes. No interval change from recent CT. Electronically Signed   By: Julian Hy M.D.   On: 08/18/2021 17:59   CT CHEST WO CONTRAST  Result Date: 08/18/2021 CLINICAL DATA:  Right breast and axillary pain. History  of right breast cancer. No injury. Recent negative diagnostic mammogram in February. EXAM: CT CHEST WITHOUT CONTRAST TECHNIQUE: Multidetector CT imaging of the chest was performed following the standard protocol without IV contrast. RADIATION DOSE REDUCTION: This exam was performed according to the departmental dose-optimization program which includes automated exposure control, adjustment of the mA and/or kV according to patient size and/or use of iterative reconstruction technique. COMPARISON:  Chest x-[place] dated Aug 16, 2021. FINDINGS: Cardiovascular: No significant vascular findings. Normal heart size. No pericardial effusion. No thoracic aortic aneurysm. Coronary, aortic arch, and branch vessel atherosclerotic vascular disease. Mediastinum/Nodes: No enlarged mediastinal or axillary lymph nodes. Thyroid gland, trachea, and  esophagus demonstrate no significant findings. Lungs/Pleura: Trace right-greater-than-left pleural effusions. 5 mm solid pulmonary nodule in the posterior left lower lobe superior segment (series 5, image 77), unchanged since November 2021. 4 mm solid pulmonary nodules in the posterior right lower lobe (series 5, images 81 and 85), also unchanged since November 2021. Minimal dependent subsegmental atelectasis in both posterior lower lobes. No consolidation or pneumothorax. Upper Abdomen: No acute abnormality. Unchanged 2.9 cm left adrenal nodule, previously characterized as a benign adenoma. No follow-up imaging is recommended. Musculoskeletal: No acute or significant osseous findings. Mild elevation of the right hemidiaphragm. Postsurgical changes in the right breast from prior lumpectomy. Advanced bilateral glenohumeral osteoarthritis with small joint effusion and intra-articular body on the left. IMPRESSION: 1. Postsurgical changes of the right breast without acute abnormality. See recent negative diagnostic mammogram results from February for follow-up recommendations. 2. Trace bilateral pleural effusions. 3. Few small pulmonary nodules in both lower lobes, unchanged since November 2021, benign. No further follow-up required. 4. Aortic Atherosclerosis (ICD10-I70.0). Electronically Signed   By: Titus Dubin M.D.   On: 08/18/2021 13:43   MR BRAIN WO CONTRAST  Result Date: 08/18/2021 CLINICAL DATA:  Altered mental status, mild cognitive impairment, weakness EXAM: MRI HEAD WITHOUT CONTRAST TECHNIQUE: Multiplanar, multiecho pulse sequences of the brain and surrounding structures were obtained without intravenous contrast. COMPARISON:  02/08/2020 MRI brain FINDINGS: Brain: No restricted diffusion to suggest acute or subacute infarct. No acute hemorrhage, mass, mass effect, or midline shift. No hydrocephalus or extra-axial collection. No hemosiderin deposition to suggest remote hemorrhage. Confluent and  scattered T2 hyperintense signal in the periventricular white matter and pons, likely the sequela of moderate chronic small vessel ischemic disease, which have increased from the prior exam. Degree of global cerebral volume loss is within normal limits for age and is without lobar predominance. Vascular: Normal arterial flow voids. Skull and upper cervical spine: Normal marrow signal. Degenerative changes in the cervical spine. Sinuses/Orbits: No acute finding. Status post bilateral lens replacements. Other: The mastoids are well aerated. IMPRESSION: No acute intracranial process. No evidence of acute or subacute infarct. Electronically Signed   By: Merilyn Baba M.D.   On: 08/18/2021 22:56    PHYSICAL EXAM Constitutional: Appears frail, elderly female Psych: Affect appropriate to situation, calm, cooperative, very pleasant, occasionally distressed when recalling challenging events of the past Respiratory: Effort normal, non-labored breathing   Neuro: Mental Status: Patient is awake, alert, oriented to person, place, month, year, and situation. Patient is able to give a clear and coherent history, though tangential at times No signs of aphasia or neglect Cranial Nerves: II: Visual Fields are full. Pupils are equal, round, and reactive to light.  III,IV, VI: EOMI without ptosis or diploplia.  Saccadic pursuits V: Facial sensation is symmetric to light touch VII: Facial movement is symmetric.  VIII: hearing is intact  to voice X: Uvula elevates symmetrically XI: Shoulder shrug is symmetric. XII: tongue is midline without atrophy or fasciculations.  Motor: Tone is normal. Bulk is normal. 5/5 strength was present in all four extremities, other than pain/range of motion limited at the bilateral shoulders and right hip flexion 4/5.  Foot plantar/dorsiflexion testing deferred given her Charcot's and neuropathy Sensory: Sensation is symmetric to light touch and temperature in the arms and legs. Deep  Tendon Reflexes: 2+ and symmetric in the brachioradialis and 1+ and symmetric in the patellae.  Cerebellar: FNF and HKS are intact bilaterally Gait:  Deferred   ASSESSMENT/PLAN [name] is a 81 y.o. 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 Patient has not been eating much over the last five days, daughter states she's mainly been drinking Boosts. Patient and daughter report that she has been having "spells" that involve headaches, chest tightness, photosensitivity, and then extreme lethargy. These have been happen multiple times per week. Initiate depakote XR '500mg'$  daily. Cardiology consult in place as well.   Syncopal episode vs seizure vs migraine Code Stroke CT head No acute abnormality. Small vessel disease.  MRI  No acute intracranial process. No evidence of acute or subacute infarct. TCD pending Carotid Doppler  pending 2D Echo pending LDL 73 HgbA1c 9.3 VTE prophylaxis - eliquis    Diet   Diet heart healthy/carb modified Room service appropriate? Yes; Fluid consistency: Thin   Eliquis (apixaban) daily prior to admission, now on Eliquis (apixaban) daily.  Therapy recommendations:  Pending Disposition:  Pending  Hx of DVT On eliquis '5mg'$  BID  Hypertension Home meds:  metoprolol, bumex Stable Permissive hypertension (OK if < 220/120) but gradually normalize in 5-7 days Long-term BP goal normotensive  Hyperlipidemia Home meds:  Crestor '20mg'$ , resumed in hospital LDL 73, goal < 70 Continue statin at  discharge  Diabetes type II Uncontrolled Home meds:  glypizide, insulin, januvia HgbA1c 9.3, goal < 7.0 CBGs Recent Labs    08/18/21 1641 08/18/21 2018 08/19/21 0732  GLUCAP 224* 216* 216*    SSI  Other Stroke Risk Factors Advanced Age >/= 90  Hx stroke/TIA No residual Coronary artery disease Migraines Congestive heart failure bumex  Other Active Problems Anxiety with Insomnia Remeron, xanax, valium Gabapentin UTI resulting in acute metabolic encephalopathy On unasyn AKI on CKD stage IV Hypokalemia, hyponatremia Protein calorie malnutrition Decreased appetite over the last 5 days, only drinking [facility] day # 2  Patient seen and examined by NP/APP with MD. MD to update note as needed.   Janine Ores, DNP, FNP-BC [facility] Pager: [phone]   To contact Stroke Continuity provider, please refer to [email or web address] After hours, contact General Neurology

## 2021-08-19 NOTE — Progress Notes (Signed)
  Echocardiogram 2D Echocardiogram has been performed.  [name] 08/19/2021, 3:02 PM

## 2021-08-19 NOTE — Plan of Care (Incomplete)
  Problem: Clinical Measurements: Goal: Ability to maintain clinical measurements within normal limits will improve Outcome: Progressing   

## 2021-08-19 NOTE — Progress Notes (Signed)
Carotid duplex hand TCD studies have been completed.   Results can be found under chart review under CV PROC. 08/19/2021 5:16 PM Brit Wernette RVT, RDMS

## 2021-08-19 NOTE — Consult Note (Signed)
Neurology Consultation Reason for Consult: Headache and c/f seizure Requesting Physician: Hosie Poisson  CC: Headache and c/f seizure  History is obtained from:  Patient, chart review and family at bedside (adopted daughter, biological granddaughter)   HPI: [name] is a 81 y.o. female with a past medical history significant for recent DVT on Eliquis, hypertension, hyperlipidemia, diabetes complicated by neuropathy, prior stroke/TIA (no clear residual), right scapular postherpetic neuralgia, restless leg syndrome, cognitive impairment, coronary artery disease s/p PCI (2007 in 6553), diastolic congestive heart failure, right breast cancer, melanoma, squamous cell carcinoma, bilateral Charcot deformities of the feet right greater than left, poor sleep on mirtazapine and Xanax.  Patient presented to the ED after an unresponsive episode at home.  Daughter was present and describes the event as the patient generally feeling ill and off that day, with reduced appetite for 5 days prior (though she was still consuming boost).  She had left the room briefly to get the patient's wheelchair and then heard her yell out for another family member.  When she got to the bedside the patient had gone from sitting at the edge of the bed to being unable to support herself.  She was then noted to have left gaze deviation and left-sided head turn with decorticate posturing and rigidity lasting about 30 seconds.  Subsequently her breathing was shallow with paradoxical abdominal movement and her pulse was faint.  After 30 seconds she had another event again with left gaze deviation and left head turn which again lasted approximately 30 seconds and after which the daughter was not able to feel a pulse.  She laid her mother down on the bed and performed chest compressions for about 30 seconds until her mother came to told her to stop.  Postevent she was very confused and somnolent and gradually came back to her normal  baseline.  She did not have bowel or bladder incontinence with this event nor tongue bite.  She gradually came to her normal self.  On ED arrival she was noted to have blood pressures in the 90s to 120s with work-up notable for hyponatremia to 128, hypochloremia to 81, hypokalemia to 3.3, and the creatinine elevation to 1.93 from a baseline of 1.4 with a BUN elevation to 28 and leukocytosis of 13.2.  Work-up revealed a UTI for which she was treated initially with ceftriaxone and now is on ampicillin.  Patient has been under some stress lately as her husband has also been ill.  She and her husband live together independently but her daughter frequently checks them and helps out.  Patient is nonambulatory at baseline due to the Charcot deformities of her feet particularly of the right foot, and has significant arthritis in bilateral shoulders.  In the last 3 years patient has had frequent headaches that are migrainous in nature (bifrontal, dull, starting at a dose of 2 in intensity but building to 7 out of 10 and lasting 1 to 3 days typically occasionally associated with nausea but no emesis, improved with Tylenol, laying down and sleeping, and associated with light and sound sensitivity).  With these headaches she will often have vision changes as well which she describes as a screen in her vision.  Today she had an event where she had a severe headache, associated with difficulty speaking and her typical vision change.  At the time of my evaluation both vision change and headache have much improved and she is at her baseline  Regarding her home medications list, it is  notable for her having previously been on Valium reportedly for bladder spasms 3 times daily, which was not refilled once she was started on Xanax which she takes 0.25 mg every night and up to 2 additional doses during the day.  She also takes mirtazapine as a sleep aid.  At a recent hospitalization also she was started on Eliquis for left leg  DVT per daughter's report.  She is additionally on Mirapex for restless leg syndrome, and gabapentin for neuropathy which has been reduced from 300 3 times daily to 300 nightly.  She also takes Tylenol with codeine for pain.  ROS: All other review of systems was negative except as noted in the HPI. Unable to obtain reliable review of systems from the patient in the setting of her cognitive impairment  Past Medical History:  Diagnosis Date   Acute blood loss anemia 02/19/2020   Acute renal failure superimposed on stage 4 chronic kidney disease ([place]) 02/21/2020   Acute respiratory failure with hypoxia ([place]) 02/21/2020   Anemia    have received iron infusions   Anxiety    Arthritis     osteoarthritis   Breast cancer ([place])    Breast cancer in female [facility])    Bilateral   CAD S/P percutaneous coronary angioplasty 2007; March 2011   [facility]' [employer] 3.0 mm 20 mm postdilated 3.6 mm in early mid LAD; status post ISR Cutting Balloon PTCA and March 11 along with PCI of distal mid lesion with a 3.0 mm 12 mm MultiLink vision BMS; the proximal stent causes jailing of SP1 and SP2 with ostial 70-80% lesions   Cancer ([place])    Melanoma, Squamous cell Carcinoma   Charcot's joint of foot, right    Charcot's joint of left foot    Chronic diastolic CHF (congestive heart failure), NYHA class 2 ([place]) 02/28/2018   Chronic kidney disease    stage 2    Closed fracture of neck of left femur ([place]) 02/08/2020   Coronary artery disease    Dementia ([place])    mild   Diabetes mellitus type 2 with neurological manifestations ([place])    Diabetes mellitus without complication (HCC)    Dyslipidemia, goal LDL below 70    Foot pain, bilateral    Full dentures    Genetic testing 12/03/2016   Germline genetic testing was performed through [employer]'s Common Hereditary Cancers Panel + [employer]'s Melanoma Panel. This custom panel includes analysis of the following 51 genes: APC, ATM, AXIN2, BAP1, BARD1, BMPR1A, BRCA1, BRCA2, BRIP1,  CDH1, CDK4, CDKN2A, CHEK2, CTNNA1, DICER1, EPCAM, GREM1, HOXB13, KIT, MEN1, MITF, MLH1, MSH2, MSH3, MSH6, MUTYH, NBN, NF1, NTHL1, PALB2, PDGFRA, PMS2, POLD1, POL   GERD (gastroesophageal reflux disease)    GIB (gastrointestinal bleeding)    Gout    Headache(784.0)    Hip fracture ([place]) 02/08/2020   History of hematuria    Followed by Dr. Gaynelle Arabian   Hypertension, essential, benign    Hypotension    Migraine    Mild cognitive impairment    Peripheral neuropathy    Personal history of radiation therapy    Post herpetic neuralgia    Restless leg syndrome    Stroke ([place])    TIA (transient ischemic attack)    multiple in the past   TIA (transient ischemic attack)    Wears glasses    Past Surgical History:  Procedure Laterality Date   ABDOMINAL HYSTERECTOMY     ANKLE FUSION Right 02/22/2018   Procedure: RIGHT SUBTALAR AND TALONAVICULAR FUSION;  Surgeon: Newt Minion, MD;  Location: [facility];  Service: Orthopedics;  Laterality: Right;   APPENDECTOMY     BIOPSY  02/19/2020   Procedure: BIOPSY;  Surgeon: Otis Brace, MD;  Location: [facility];  Service: Gastroenterology;;   BLADDER SUSPENSION     BREAST LUMPECTOMY Bilateral 2018   BREAST LUMPECTOMY WITH RADIOACTIVE SEED AND SENTINEL LYMPH NODE BIOPSY Bilateral 12/14/2016   Procedure: BILATERAL BREAST LUMPECTOMY WITH BILATERAL  RADIOACTIVE SEED AND BILATERAL AXILLARY  SENTINEL LYMPH NODE BIOPSY ERAS PATHWAY;  Surgeon: Alphonsa Overall, MD;  Location: [facility];  Service: General;  Laterality: Bilateral;  [place]  02/24/2006   85% stenosis in the proximal portion of LAD-arrangements made for PCI on 02/25/2006   CARDIAC CATHETERIZATION  02/25/2006   80% LAD lesion stented with a 3x54m Liberte stent resulting in reduction of 80% lesion to 0% residual   CARDIAC CATHETERIZATION  04/26/2006   Medical management   CARDIAC CATHETERIZATION  06/24/2009   60-70% re-stenosis in the proximal LAD. A 3.25x15 cutting  balloon, 3 inflations - 14atm-38sec, 13atm-39sec, and 12atm-40sec reduced to less than 10%. 60% stenosis of the mid/distal LAD stented with a 3x18mMultilink stent.   CARDIAC CATHETERIZATION  07/09/2009   Medical management   CARDIAC CATHETERIZATION     CAROTID DOPPLER  06/24/2009   40-59% R ICA stenosis. No significant ICA stenosis noted   CHOLECYSTECTOMY     CORONARY ANGIOPLASTY     DILATION AND CURETTAGE OF UTERUS     ESOPHAGOGASTRODUODENOSCOPY N/A 02/26/2020   Procedure: ESOPHAGOGASTRODUODENOSCOPY (EGD);  Surgeon: MaClarene EssexMD;  [place] [facility] Service: Endoscopy;  Laterality: N/A;   ESOPHAGOGASTRODUODENOSCOPY (EGD) WITH PROPOFOL N/A 02/19/2020   Procedure: ESOPHAGOGASTRODUODENOSCOPY (EGD) WITH PROPOFOL;  Surgeon: BrOtis BraceMD;  [facility]: [facility] Service: Gastroenterology;  Laterality: N/A;   ESOPHAGOGASTRODUODENOSCOPY (EGD) WITH PROPOFOL Left 02/23/2020   Procedure: ESOPHAGOGASTRODUODENOSCOPY (EGD) WITH PROPOFOL;  Surgeon: OuArta SilenceMD;  [place] [facility] Service: Endoscopy;  Laterality: Left;   HIP ARTHROPLASTY Left 02/10/2020   Procedure: ARTHROPLASTY BIPOLAR HIP (HEMIARTHROPLASTY) POSTERIOR LATERAL;  Surgeon: MuRenette ButtersMD;  [place]: [facility] Service: Orthopedics;  Laterality: Left;   JOINT REPLACEMENT Left    LESION REMOVAL Right 03/11/2015   Procedure:  EXCISION OF RIGHT PRE TIBIAL LESION;  Surgeon: JaJudeth HornMD;  [place] [facility] Service: General;  Laterality: Right;   MASS EXCISION Left 06/10/2014   Procedure: EXCISION LEFT LOWER LEG LESION;  Surgeon: JaDoreen SalvageMD;  [facility] Service: General;  Laterality: Left;   MASS EXCISION Left 09/05/2015   Procedure: EXCISION OF LEFT FOREARM  MASS;  Surgeon: JaJudeth HornMD;  [place] [facility] Service: General;  Laterality: Left;   NM MYOVIEW LTD  10/2011   No ischemia or infarction.  Normal EF.  LOW RISK. (Short bursts of  5 beats NSVT during recovery otherwise normal)   POLYPECTOMY  02/19/2020   Procedure: POLYPECTOMY;  Surgeon: BrOtis BraceMD;  [facility];  Service: Gastroenterology;;   SHOULDER ARTHROSCOPY  10/15   SHOULDER ARTHROSCOPY  1995   left   TEE WITHOUT CARDIOVERSION N/A 08/03/2017   Procedure: TRANSESOPHAGEAL ECHOCARDIOGRAM (TEE);  Surgeon: RaSkeet LatchMD;  [place] [facility] Service: Cardiovascular;; EF 60-65% with no wall motion normalities.  No atrial thrombus noted.  Lightly findings consistent with a lipomatous hypertrophy of the atrial septum.    TENDON REPAIR Left 05/12/2016  Procedure: Left Anterior Tibial Tendon Reconstruction;  Surgeon: Newt Minion, MD;  Location[facility];  Service: Orthopedics;  Laterality: Left;   TOTAL KNEE ARTHROPLASTY  2005   left   TRANSESOPHAGEAL ECHOCARDIOGRAM  08/03/2016   : EF 60-65% no wall motion normalities.  No atrial thrombus.  Lipomatous hypertrophy of the atrial septum.  No mass noted.   TRANSESOPHAGEAL ECHOCARDIOGRAM  07/2017   EF 60 to 65%.  No R WMA.  No atrial thrombus noted.  Lipomatous IAS.   TRANSTHORACIC ECHOCARDIOGRAM  02/25/2017   :EF 65-70%.  GR 1 DD.  No or WMA.  Moderate aortic calcification/sclerosis but no stenosis.   TRANSTHORACIC ECHOCARDIOGRAM  07/2017   Vigorous LV function.  EF 65-70%.  Mild diastolic dysfunction with no RWMA. GR 1 DD.  Mild aortic sclerosis/no stenosis.  Questionable right atrial mass discounted with TEE)    WRIST ARTHROPLASTY  2010   cancer lt wrist   Current Outpatient Medications  Medication Instructions   acetaminophen-codeine (TYLENOL #3) 300-30 MG tablet 1 tablet, Oral, Every 4 hours PRN   ALPRAZolam (XANAX) 0.25-0.5 mg, Oral, 2 times daily PRN   BIOTIN PO 1 tablet, Daily   Blood Glucose Monitoring Suppl (FREESTYLE LITE) w/Device KIT use as directed   bumetanide (BUMEX) 4 mg, Oral, Daily   clopidogrel (PLAVIX) 75 mg, Daily   Coenzyme Q10 (COQ-10 PO) 1 tablet, Daily   COLCRYS  0.6 MG tablet TAKE 1 TABLET DAILY AS NEEDED FOR GOUT FLARE.   CRANBERRY-CALCIUM PO 1 tablet, Daily   Cranberry 500 mg, Every evening   diazepam (VALIUM) 5 mg, Oral, 3 times daily PRN   Eliquis 5 mg, Oral, 2 times daily   exemestane (AROMASIN) 25 mg, Oral, Daily after breakfast, TAKE 1 TABLET DAILY AFTER BREAKFAST   febuxostat (ULORIC) 40 mg, Oral, Daily   gabapentin (NEURONTIN) 300 mg, Oral, Daily at bedtime   glipiZIDE (GLUCOTROL) 2.5 mg, Oral, Daily before breakfast   glucose blood (FREESTYLE LITE) test strip Test blood sugars up to 4 times daily   Insulin Pen Needle (PENTIPS) 32G X 4 MM MISC Use as directed   insulin regular (NOVOLIN R) 100 units/mL injection Subcutaneous, 3 times daily before meals, Started last week 4/28: Sliding scale before meals: <150 none,<BR>                                                                          151-199 - 2 units<BR>                                                                           200-249 - 4 units<BR>  250-299 - 6 units<BR>                                                                           300-349 - 8 units<BR>                                                                           350> 10 units   Januvia 25 mg, Oral, Daily   Krill Oil 500 mg, Oral, Daily   Lancets (FREESTYLE) lancets Test blood sugars up to 4 times daily   Lantus SoloStar 5 Units, Subcutaneous, Daily   Methylfol-Algae-B12-Acetylcyst (CEREFOLIN NAC PO) 1 tablet, Oral, Every morning   Methylfol-Methylcob-Acetylcyst (CEREFOLIN NAC) 6-2-600 MG TABS TAKE 1 TABLET BY MOUTH EVERY DAY   metolazone (ZAROXOLYN) 2.5 mg, Oral, Daily, 30 minutes before Bumetanide Mon. & Thurs.    metoprolol succinate (TOPROL-XL) 25 mg, Oral, Nightly   mirtazapine (REMERON) 15 mg, Oral, Daily at bedtime   Multiple Vitamin (MULTIVITAMIN WITH MINERALS) TABS tablet 1 tablet, Oral, Daily   nitroGLYCERIN (NITROLINGUAL) 0.4  MG/SPRAY spray 1 spray, Sublingual, Every 5 min x3 PRN   pantoprazole (PROTONIX) 40 mg, Oral, 2 times daily before meals   PenTips 5 Units, Does not apply, 2 times daily   potassium chloride SA (KLOR-CON) 20 MEQ tablet 20 mEq, 2 times daily   pramipexole (MIRAPEX) 0.25 MG tablet TAKE 1 TO 2 TABLETS AS INSTRUCTED, 1 TABLET AT 4:00 P.M. AND 2 TABLETS AT BEDTIME   rosuvastatin (CRESTOR) 20 mg, Oral, Daily   Vitamin D 2,000 Units, Daily     Family History  Problem Relation Age of Onset   Diabetes Mother    Kidney disease Mother    Diabetes Sister    Diabetes Brother    Hypertension Mother    Hypertension Father    Emphysema Father    Heart attack Father    Heart disease Father    Arthritis Sister    Diabetes Sister    Hypertension Sister    Breast cancer Sister 34       treated with mastectomy   Cervical cancer Sister 20   Hypertension Brother    Hyperlipidemia Brother    Diabetes Brother    Stroke Brother    Diabetes Sister    Hypertension Sister    Stomach cancer Sister 78   Hypertension Brother    ALS Brother        d.62s   Breast cancer Daughter 23       triple negative treated with lumpectomy, chemo, radiation   Heart attack Daughter 51       d.45   COPD Daughter    Cancer Paternal Aunt        unspecified type   Cancer Paternal Uncle        unspecified type   Cancer Paternal Uncle        unspecified type    Social History:  reports that she has never  smoked. She has never used smokeless tobacco. She reports that she does not drink alcohol and does not use drugs.   Exam: Current vital signs: BP (!) 147/77   Pulse 64   Temp 98.4 F (36.9 C) (Oral)   Resp 18   Ht _0  (1.702 m)   Wt 78.4 kg   SpO2 98%   BMI 27.07 kg/m  Vital signs in last 24 hours: Temp:  [97.6 F (36.4 C)-98.4 F (36.9 C)] 98.4 F (36.9 C) (05/23 2020) Pulse Rate:  [58-67] 64 (05/23 2020) Resp:  [16-18] 18 (05/23 2020) BP: (114-147)/(71-80) 147/77 (05/23 2020) SpO2:  [95 %-98  %] 98 % (05/23 2020) Weight:  [78.4 kg] 78.4 kg (05/23 0500)   Physical Exam  Constitutional: Appears frail Psych: Affect appropriate to situation, calm, cooperative, very pleasant, occasionally distressed when recalling challenging events of the past Eyes: No scleral injection HENT: No oropharyngeal obstruction.  MSK: Charcot deformities of the bilateral feet, right foot lower greater than left Cardiovascular: Perfusing extremities well Respiratory: Effort normal, non-labored breathing GI: Soft.  No distension. There is no tenderness.  Skin: Warm dry and intact visible skin  Neuro: Mental Status: Patient is awake, alert, oriented to person, place, month, year, and situation. Patient is able to give a clear and coherent history, though tangential at times No signs of aphasia or neglect Cranial Nerves: II: Visual Fields are full. Pupils are equal, round, and reactive to light.  III,IV, VI: EOMI without ptosis or diploplia.  Saccadic pursuits V: Facial sensation is symmetric to light touch VII: Facial movement is symmetric.  VIII: hearing is intact to voice X: Uvula elevates symmetrically XI: Shoulder shrug is symmetric. XII: tongue is midline without atrophy or fasciculations.  Motor: Tone is normal. Bulk is normal. 5/5 strength was present in all four extremities, other than pain/range of motion limited at the bilateral shoulders and right hip flexion 4/5.  Foot plantar/dorsiflexion testing deferred given her Charcot's and neuropathy Sensory: Sensation is symmetric to light touch and temperature in the arms and legs. Deep Tendon Reflexes: 2+ and symmetric in the brachioradialis and 1+ and symmetric in the patellae.  Cerebellar: FNF and HKS are intact bilaterally Gait:  Deferred   I have reviewed labs in epic and the results pertinent to this consultation are:  Basic Metabolic Panel: Recent Labs  Lab 08/16/21 2010 08/16/21 2017 08/17/21 0024 08/17/21 0505 08/18/21 0259   NA 128* 126*  --  129* 130*  K 3.3* 3.0*  --  3.6 3.6  CL 81*  --   --  88* 92*  CO2 32  --   --  31 27  GLUCOSE 148*  --   --  208* 184*  BUN 28*  --   --  30* 23  CREATININE 1.93*  --   --  1.96* 1.42*  CALCIUM 9.6  --   --  9.1 9.0  MG  --   --  2.1  --   --     CBC: Recent Labs  Lab 08/16/21 2010 08/16/21 2017 08/17/21 0505 08/18/21 0259  WBC 13.2*  --  10.4 8.1  NEUTROABS 9.4*  --   --  5.2  HGB 14.9 15.3* 12.6 12.0  HCT 42.7 45.0 36.7 36.1  MCV 86.8  --  88.4 89.4  PLT 238  --  188 163    Coagulation Studies: No results for input(s): LABPROT, INR in the last 72 hours.    Lab Results  Component Value Date  CHOL 148 02/08/2020   HDL 36 (L) 02/08/2020   LDLCALC 73 02/08/2020   TRIG 193 (H) 02/08/2020   CHOLHDL 4.1 02/08/2020   Lab Results  Component Value Date   HGBA1C 9.3 (H) 08/18/2021     I have reviewed the images obtained:  MRI brain 5/23 personally reviewed and reviewed with patient and granddaughter at bedside.  Agree with radiology, no evidence of acute intracranial process though there are changes consistent with chronic microvascular disease  Head CT 5/23 and 5/21 personally reviewed, agree with radiology no acute intracranial process  Impression: This is an 81 year old woman with a past medical history of multiple vascular risk factors as detailed above.  She presented with an episode of loss of consciousness that is concerning for seizure activity.  However given her significant polypharmacy and initial presentation with electrolyte derangements and infection, I am hesitant to add any antiseizure medication at this time unless EEG is concerning for epilepsy.  Regarding the event today with headache, this sounds more migrainous in nature.  Recommendations: -EEG -Hold off on adding any antiseizure medications at this time, but should she have recurrent episodes concerning for seizure, she should be started on antiseizure medications at that  point -Continued outpatient follow-up with Dr. Leonie Man for headaches, consider CGRP inhibitor given the frequency of her headaches -Neurology will follow up EEG but otherwise will be available on an as-needed basis going forward.  I will also reach out to Dr. Leonie Man as family has requested he see the patient if possible  Lesleigh Noe MD-PhD [facility] [phone] Available 7 AM to 7 PM, outside these hours please contact Neurologist on call listed on AMION
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
## 2021-08-20 ENCOUNTER — Inpatient Hospital Stay (HOSPITAL_COMMUNITY)
Admit: 2021-08-20 | Discharge: 2021-08-20 | Disposition: A | Discharge status: Home / Self Care | Payer: Medicare Other | Attending: Physician Assistant | Admitting: Physician Assistant

## 2021-08-20 DIAGNOSIS — G9341 Metabolic encephalopathy: Secondary | ICD-10-CM | POA: Diagnosis not present

## 2021-08-20 DIAGNOSIS — R404 Transient alteration of awareness: Secondary | ICD-10-CM | POA: Diagnosis not present

## 2021-08-20 DIAGNOSIS — F419 Anxiety disorder, unspecified: Secondary | ICD-10-CM | POA: Diagnosis not present

## 2021-08-20 DIAGNOSIS — N179 Acute kidney failure, unspecified: Secondary | ICD-10-CM | POA: Diagnosis not present

## 2021-08-20 LAB — BASIC METABOLIC PANEL
Anion gap: 9 (ref 5–15)
BUN: 17 mg/dL (ref 8–23)
CO2: 30 mmol/L (ref 22–32)
Calcium: 9.8 mg/dL (ref 8.9–10.3)
Chloride: 99 mmol/L (ref 98–111)
Creatinine, Ser: 1.53 mg/dL — ABNORMAL HIGH (ref 0.44–1.00)
GFR, Estimated: 34 mL/min — ABNORMAL LOW (ref >60)
Glucose, Bld: 171 mg/dL — ABNORMAL HIGH (ref 70–99)
Potassium: 3.9 mmol/L (ref 3.5–5.1)
Sodium: 138 mmol/L (ref 135–145)

## 2021-08-20 LAB — GLUCOSE, CAPILLARY: Glucose-Capillary: 236 mg/dL — ABNORMAL HIGH (ref 70–99)

## 2021-08-20 MED ORDER — AMOXICILLIN 500 MG PO CAPS
500.0000 mg | ORAL_CAPSULE | Freq: Two times a day (BID) | ORAL | Status: DC
  Filled 2021-08-20: qty 1

## 2021-08-20 MED ORDER — AMITRIPTYLINE HCL 25 MG PO TABS: 25.0000 mg | ORAL_TABLET | Freq: Every day | ORAL | 2 refills | Status: AC

## 2021-08-20 MED ORDER — AMOXICILLIN 500 MG PO CAPS
500.0000 mg | ORAL_CAPSULE | Freq: Two times a day (BID) | ORAL | 0 refills | Status: AC
Start: 2021-08-20 — End: 2021-08-22

## 2021-08-20 MED ORDER — AMITRIPTYLINE HCL 25 MG PO TABS: 25.0000 mg | ORAL_TABLET | Freq: Every day | ORAL | Status: DC

## 2021-08-20 NOTE — Discharge Summary (Signed)
PATIENT DETAILS Name: [name] Age: 81 y.o. Sex: female Date of Birth: Jul 13, 1940 MRN: [id number]. Admitting Physician: Hosie Poisson, MD HDQ:[id number], [name], NP  Admit Date: 08/16/2021 Discharge date: 08/20/2021  Recommendations for Outpatient Follow-up:  Follow up with PCP in 1-2 weeks Please obtain CMP/CBC in one week  Admitted From:  Home  Disposition: Home health   Discharge Condition: good  CODE STATUS:   Code Status: Full Code   Diet recommendation:  Diet Order             Diet - low sodium heart healthy           Diet Carb Modified           Diet heart healthy/carb modified Room service appropriate? Yes; Fluid consistency: Thin  Diet effective now                    Brief Summary: Patient is a 81 y.o.  female with history of VTE on Eliquis, HTN, HLD, DM-2, CAD s/p PCI, HFpEF, prior CVA, RLS, cognitive impairment, history of breast cancer-presented to the ED for evaluation of syncope/AMS/weakness-she was subsequently found to have UTI, hypokalemia, hyponatremia.     Significant events: 5/21>> admitted with weakness/AMS/syncope-UTI/hypokalemia/hyponatremia. 5/23>> headache/blurry vision-thought to be due to migraine.   Significant studies: 5/21>> CT head: No acute intracranial process 5/23>> postsurgical changes right breast-without acute abnormalities.  Small pulmonary nodules in both lobes-unchanged since 2021-benign. 5/23>> CT head: No acute intracranial abnormality 5/23>> MRI brain: No acute CVA 5/24>> EEG: No seizures 5/24>> Echo: EF 98-92%, grade 1 diastolic dysfunction. 5/24>> carotid Doppler: left carotid 40-50%, right carotid 1-39%.   Significant microbiology data: 5/22>> urine culture: Enterococcus   Procedures: None   Consults: Neurology, cardiology  Brief Hospital Course: Acute metabolic encephalopathy: Due to UTI and dehydration.  Neuroimaging as above-clinically improved with supportive care-she is back to her baseline.    Complicated UTI: Urine culture positive for Enterococcus-on Unasyn-will be switched to amoxicillin on discharge.   Syncope: Per neurology-concerning for seizures (likely provoked from UTI/electrolyte abnormalities).  MRI brain negative for any significant abnormalities.  Evaluated by both cardiology and neurology.  Outpatient event monitor will be arranged by cardiology.  Neuro not recommending starting any AEDs at this point.   AKI on CKD stage IV: AKI likely hemodynamically mediated kidney injury-creatinine has stabilized with supportive care   Hyponatremia: Due to dehydration-improved.  Hypokalemia: Repleted.  Chronic HFpEF: Compensated.  Resume Bumex at usual dosing on discharge.   CAD: No anginal symptoms-continue metoprolol/statin.   History of CVA: No focal deficits-on Eliquis/statin   Migraine headache: Had an episode of headache/blurry vision during this hospitalization-improved-initially plans were to place her on Depakote-however upon further discussion with neurology on 5/24-plans are to switch to amitriptyline.   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  Further optimization deferred to the outpatient setting.  GERD: Continue PPI   RLS: On pramipexole  Anxiety: Stable-on Xanax.  Per neurology-to stop Remeron as patient will be on amitriptyline.   History of breast cancer: Continue Aromasin-follow with primary oncologist postdischarge.   History of  cognitive dysfunction: At baseline.   BMI: Estimated body mass index is 27.14 kg/m as calculated from the following:   Height as of this encounter: 5' 7" (1.702 m).   Weight as of this encounter: 78.6 kg.   Discharge Diagnoses:  Principal Problem:  AMS (altered mental status) Active Problems:   Anxiety   CAD S/P percutaneous coronary angioplasty   Essential hypertension    Dyslipidemia, goal LDL below 70   GERD (gastroesophageal reflux disease)   Mild cognitive impairment   Restless legs syndrome   Headache, migraine   Malignant 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, estrogen receptor positive (HCC)   Chronic idiopathic gout involving toe of left foot without tophus   Chronic diastolic CHF (congestive heart failure), NYHA class 2 (HCC)   Type 2 diabetes mellitus with vascular disease ([facility])   History of CVA (cerebrovascular accident)   Acute renal failure superimposed on stage 4 chronic kidney disease (HCC)   Acute metabolic encephalopathy   Discharge Instructions:  Activity:  As tolerated with Full fall precautions use walker/cane & assistance as needed   Discharge Instructions     Ambulatory referral to Neurology   Complete by: As directed    An appointment is requested in approximately: 4 weeks   Diet - low sodium heart healthy   Complete by: As directed    Diet Carb Modified   Complete by: As directed    Discharge instructions   Complete by: As directed    Follow with Primary MD  Cipriano Mile, NP in 1-2 weeks  Please get a complete blood count and chemistry panel checked by your Primary MD at your next visit, and again as instructed by your Primary MD.  Get Medicines reviewed and adjusted: Please take all your medications with you for your next visit with your Primary MD  Laboratory/radiological data: Please request your Primary MD to go over all hospital tests and procedure/radiological results at the follow up, please ask your Primary MD to get all Hospital records sent to his/her office.  In some cases, they will be blood work, cultures and biopsy results pending at the time of your discharge. Please request that your primary care M.D. follows up on these results.  Also Note the following: If you experience worsening of your admission symptoms, develop shortness of breath, life threatening emergency, suicidal or  homicidal thoughts you must seek medical attention immediately by calling 911 or calling your MD immediately  if symptoms less severe.  You must read complete instructions/literature along with all the possible adverse reactions/side effects for all the Medicines you take and that have been prescribed to you. Take any new Medicines after you have completely understood and accpet all the possible adverse reactions/side effects.   Do not drive when taking Pain medications or sleeping medications (Benzodaizepines)  Do not take more than prescribed Pain, Sleep and Anxiety Medications. It is not advisable to combine anxiety,sleep and pain medications without talking with your primary care practitioner  Special Instructions: If you have smoked or chewed Tobacco  in the last 2 yrs please stop smoking, stop any regular Alcohol  and or any Recreational drug use.  Wear Seat belts while driving.  Please note: You were cared for by a hospitalist during your hospital stay. Once you are discharged, your primary care physician will handle any further medical issues. Please note that NO REFILLS for any discharge medications will be authorized once you are discharged, as it is imperative that you return to your primary care physician (or establish a relationship with a primary care physician if you do not have one) for your post hospital discharge needs so that they can reassess your need for medications and monitor your lab values.   Discharge wound care:  Complete by: As directed    Apply iodine from the swabsticks or swab pads from clean utility to the calloused area on the right foot.  Allow to air dry. Then place the foot into the Prevalon boot.   Increase activity slowly   Complete by: As directed       Allergies as of 08/20/2021       Reactions   Nsaids Nausea And Vomiting, Palpitations   Reglan [metoclopramide] Other (See Comments)   Seizures    Tramadol Other (See Comments)   Seizures    Ultram  [tramadol] Other (See Comments)   Pt has seizure activity with this medication   Lipitor [atorvastatin] Other (See Comments)   Bone and muscle pain   Lipitor [atorvastatin] Swelling, Other (See Comments)   Pain    Lyrica [pregabalin] Other (See Comments)   Weight gain, extremity swelling   Lyrica [pregabalin] Swelling   Reglan [metoclopramide] Other (See Comments)   Tardive dyskinesia; paradoxical reaction not relieved by benadryl   Nsaids Swelling   SWELLING REACTION UNSPECIFIED    Urecholine [bethanechol]    Palpitations and nausea        Medication List     STOP taking these medications    BIOTIN PO   Cerefolin NAC 6-2-600 MG Tabs   clopidogrel 75 MG tablet Commonly known as: PLAVIX   COQ-10 PO   Cranberry 500 MG Tabs   CRANBERRY-CALCIUM PO   mirtazapine 15 MG tablet Commonly known as: REMERON   Vitamin D 50 MCG (2000 UT) tablet       TAKE these medications    acetaminophen-codeine 300-30 MG tablet Commonly known as: TYLENOL #3 Take 1 tablet by mouth every 4 (four) hours as needed for moderate pain.   ALPRAZolam 0.25 MG tablet Commonly known as: XANAX Take 1-2 tablets (0.25-0.5 mg total) by mouth 2 (two) times daily as needed for anxiety.   amitriptyline 25 MG tablet Commonly known as: ELAVIL Take 1 tablet (25 mg total) by mouth at bedtime.   amoxicillin 500 MG capsule Commonly known as: AMOXIL Take 1 capsule (500 mg total) by mouth every 12 (twelve) hours for 2 days.   bumetanide 2 MG tablet Commonly known as: BUMEX Take 4 mg by mouth daily.   CEREFOLIN NAC PO Take 1 tablet by mouth in the morning.   Colcrys 0.6 MG tablet Generic drug: colchicine TAKE 1 TABLET DAILY AS NEEDED FOR GOUT FLARE. What changed: See the new instructions.   diazepam 5 MG tablet Commonly known as: VALIUM Take 5 mg by mouth 3 (three) times daily as needed for anxiety.   Eliquis 5 MG Tabs tablet Generic drug: apixaban Take 5 mg by mouth 2 (two) times daily.    exemestane 25 MG tablet Commonly known as: AROMASIN Take 1 tablet (25 mg total) by mouth daily after breakfast. TAKE 1 TABLET DAILY AFTER BREAKFAST   febuxostat 40 MG tablet Commonly known as: ULORIC Take 40 mg by mouth daily.   freestyle lancets Test blood sugars up to 4 times daily   FREESTYLE LITE test strip Generic drug: glucose blood Test blood sugars up to 4 times daily   FreeStyle Lite w/Device Kit use as directed   gabapentin 300 MG capsule Commonly known as: NEURONTIN Take 300 mg by mouth at bedtime.   glipiZIDE 5 MG tablet Commonly known as: GLUCOTROL Take 2.5 mg by mouth daily before breakfast.   insulin regular 100 units/mL injection Commonly known as: NOVOLIN R Inject into the skin 3 (  three) times daily before meals. Started last week 4/28: Sliding scale before meals: <150 none,                                                                           151-199 - 2 units                                                                            200-249 - 4 units                                                                            250-299 - 6 units                                                                            300-349 - 8 units                                                                            350> 10 units   Januvia 25 MG tablet Generic drug: sitaGLIPtin Take 25 mg by mouth daily.   Krill Oil 500 MG Caps Take 500 mg by mouth daily.   Lantus SoloStar 100 UNIT/ML Solostar Pen Generic drug: insulin glargine Inject 5 Units into the skin daily.   metolazone 2.5 MG tablet Commonly known as: ZAROXOLYN Take 2.5 mg by mouth daily. 30 minutes before Bumetanide Mon. & Thurs.   metoprolol succinate 25 MG 24 hr tablet Commonly known as: TOPROL-XL Take 25 mg by mouth at bedtime.   multivitamin with minerals Tabs tablet Take 1 tablet by mouth daily.   nitroGLYCERIN 0.4 MG/SPRAY spray Commonly known as: NITROLINGUAL Place 1 spray under  the tongue every 5 (five) minutes x 3 doses as needed for chest pain.   pantoprazole 40 MG tablet Commonly known as: PROTONIX Take 1 tablet (40 mg total) by mouth 2 (two) times daily before a meal.   PenTips 32G X 4 MM Misc Generic drug: Insulin Pen Needle 5 Units by Does not apply route in the morning and at bedtime.   PenTips 32G X 4 MM Misc Generic  drug: Insulin Pen Needle Use as directed   potassium chloride SA 20 MEQ tablet Commonly known as: KLOR-CON M Take 20 mEq by mouth 2 (two) times daily.   pramipexole 0.25 MG tablet Commonly known as: MIRAPEX TAKE 1 TO 2 TABLETS AS INSTRUCTED, 1 TABLET AT 4:00 P.M. AND 2 TABLETS AT BEDTIME What changed: See the new instructions.   rosuvastatin 20 MG tablet Commonly known as: CRESTOR Take 20 mg by mouth daily.               Discharge Care Instructions  (From admission, onward)           Start     Ordered   08/20/21 0000  Discharge wound care:       Comments: Apply iodine from the swabsticks or swab pads from clean utility to the calloused area on the right foot.  Allow to air dry. Then place the foot into the Prevalon boot.   08/20/21 1049            Follow-up Information     [facility] Follow up.   Specialty: Home Health Services Why: Someone will call you to schedule your resumption of care appointment. Contact information: [address] [place] [address] [phone]         Cipriano Mile, NP. Schedule an appointment as soon as possible for a visit in 1 week(s).   Contact information: [place] [address] [phone]         Leonie Man, MD .   Specialty: Cardiology Contact information: [address] [place] [address] [phone]         [facility] Follow up.   Specialty: Neurology Why: Office will call with date/time, If you dont hear from them,please give them a call Contact information: [place] [address] [phone]               Allergies  Allergen Reactions   Nsaids Nausea And Vomiting and Palpitations   Reglan [Metoclopramide] Other (See Comments)    Seizures    Tramadol Other (See Comments)    Seizures    Ultram [Tramadol] Other (See Comments)    Pt has seizure activity with this medication   Lipitor [Atorvastatin] Other (See Comments)    Bone and muscle pain   Lipitor [Atorvastatin] Swelling and Other (See Comments)    Pain    Lyrica [Pregabalin] Other (See Comments)    Weight gain, extremity swelling   Lyrica [Pregabalin] Swelling   Reglan [Metoclopramide] Other (See Comments)    Tardive dyskinesia; paradoxical reaction not relieved by benadryl   Nsaids Swelling    SWELLING REACTION UNSPECIFIED    Urecholine [Bethanechol]     Palpitations and nausea     Other Procedures/Studies: CT HEAD WO CONTRAST (5MM)  Result Date: 08/18/2021 CLINICAL DATA:  Altered mental status, migraine EXAM: CT HEAD WITHOUT CONTRAST TECHNIQUE: Contiguous axial images were obtained from the base of the skull through the vertex without intravenous contrast. RADIATION DOSE REDUCTION: This exam was performed according to the departmental dose-optimization program which includes automated exposure control, adjustment of the mA and/or kV according to patient size and/or use of iterative reconstruction technique. COMPARISON:  08/16/2021 FINDINGS: Brain: No evidence of acute infarction, hemorrhage, hydrocephalus, extra-axial collection or mass lesion/mass effect. Subcortical white matter and periventricular small vessel ischemic changes. Vascular: Intracranial atherosclerosis. Skull: Normal. Negative for fracture or focal lesion. Sinuses/Orbits: The visualized  paranasal sinuses are essentially clear. The mastoid air cells are unopacified. Other: None. IMPRESSION: No acute intracranial abnormality. Small vessel ischemic changes. No interval change  from recent CT. Electronically Signed   By: Julian Hy M.D.   On: 08/18/2021 17:59   CT Head Wo Contrast  Result Date: 08/16/2021 CLINICAL DATA:  Altered mental status. EXAM: CT HEAD WITHOUT CONTRAST TECHNIQUE: Contiguous axial images were obtained from the base of the skull through the vertex without intravenous contrast. RADIATION DOSE REDUCTION: This exam was performed according to the departmental dose-optimization program which includes automated exposure control, adjustment of the mA and/or kV according to patient size and/or use of iterative reconstruction technique. COMPARISON:  CT head dated 11/26/2019. FINDINGS: Brain: No evidence of acute infarction, hemorrhage, hydrocephalus, extra-axial collection or mass lesion/mass effect. Periventricular white matter hypoattenuation likely represents chronic small vessel ischemic disease. Vascular: There are vascular calcifications in the carotid siphons. Skull: Normal. Negative for fracture or focal lesion. Sinuses/Orbits: No acute finding. Other: None. IMPRESSION: No acute intracranial process. Electronically Signed   By: Zerita Boers M.D.   On: 08/16/2021 20:36   CT CHEST WO CONTRAST  Result Date: 08/18/2021 CLINICAL DATA:  Right breast and axillary pain. History of right breast cancer. No injury. Recent negative diagnostic mammogram in February. EXAM: CT CHEST WITHOUT CONTRAST TECHNIQUE: Multidetector CT imaging of the chest was performed following the standard protocol without IV contrast. RADIATION DOSE REDUCTION: This exam was performed according to the departmental dose-optimization program which includes automated exposure control, adjustment of the mA and/or kV according to patient size and/or use of iterative reconstruction technique. COMPARISON:  Chest x-Awan dated Aug 16, 2021. FINDINGS: Cardiovascular: No significant vascular findings. Normal heart size. No pericardial effusion. No thoracic aortic aneurysm. Coronary, aortic arch, and  branch vessel atherosclerotic vascular disease. Mediastinum/Nodes: No enlarged mediastinal or axillary lymph nodes. Thyroid gland, trachea, and esophagus demonstrate no significant findings. Lungs/Pleura: Trace right-greater-than-left pleural effusions. 5 mm solid pulmonary nodule in the posterior left lower lobe superior segment (series 5, image 77), unchanged since November 2021. 4 mm solid pulmonary nodules in the posterior right lower lobe (series 5, images 81 and 85), also unchanged since November 2021. Minimal dependent subsegmental atelectasis in both posterior lower lobes. No consolidation or pneumothorax. Upper Abdomen: No acute abnormality. Unchanged 2.9 cm left adrenal nodule, previously characterized as a benign adenoma. No follow-up imaging is recommended. Musculoskeletal: No acute or significant osseous findings. Mild elevation of the right hemidiaphragm. Postsurgical changes in the right breast from prior lumpectomy. Advanced bilateral glenohumeral osteoarthritis with small joint effusion and intra-articular body on the left. IMPRESSION: 1. Postsurgical changes of the right breast without acute abnormality. See recent negative diagnostic mammogram results from February for follow-up recommendations. 2. Trace bilateral pleural effusions. 3. Few small pulmonary nodules in both lower lobes, unchanged since November 2021, benign. No further follow-up required. 4. Aortic Atherosclerosis (ICD10-I70.0). Electronically Signed   By: Titus Dubin M.D.   On: 08/18/2021 13:43   MR BRAIN WO CONTRAST  Result Date: 08/18/2021 CLINICAL DATA:  Altered mental status, mild cognitive impairment, weakness EXAM: MRI HEAD WITHOUT CONTRAST TECHNIQUE: Multiplanar, multiecho pulse sequences of the brain and surrounding structures were obtained without intravenous contrast. COMPARISON:  02/08/2020 MRI brain FINDINGS: Brain: No restricted diffusion to suggest acute or subacute infarct. No acute hemorrhage, mass, mass  effect, or midline shift. No hydrocephalus or extra-axial collection. No hemosiderin deposition to suggest remote hemorrhage. Confluent and scattered T2 hyperintense signal in the periventricular white matter and  pons, likely the sequela of moderate chronic small vessel ischemic disease, which have increased from the prior exam. Degree of global cerebral volume loss is within normal limits for age and is without lobar predominance. Vascular: Normal arterial flow voids. Skull and upper cervical spine: Normal marrow signal. Degenerative changes in the cervical spine. Sinuses/Orbits: No acute finding. Status post bilateral lens replacements. Other: The mastoids are well aerated. IMPRESSION: No acute intracranial process. No evidence of acute or subacute infarct. Electronically Signed   By: Merilyn Baba M.D.   On: 08/18/2021 22:56   DG Chest Portable 1 View  Result Date: 08/16/2021 CLINICAL DATA:  Altered mental status. EXAM: PORTABLE CHEST 1 VIEW COMPARISON:  Chest radiograph dated 05/12/2021. FINDINGS: The patient is rotated to the right. The heart size and mediastinal contours are within normal limits. Vascular calcifications are seen in the aortic arch. both lungs are clear. The visualized skeletal structures are unremarkable. IMPRESSION: No active disease. Aortic Atherosclerosis (ICD10-I70.0). Electronically Signed   By: Zerita Boers M.D.   On: 08/16/2021 20:25   EEG adult  Result Date: 08/19/2021 Lora Havens, MD     08/19/2021 11:15 AM Patient Name: [name] MRN: [id number] Epilepsy Attending: Lora Havens Referring Physician/Provider: Lorenza Chick, MD Date: 08/19/2021 Duration: 21.45 mins Patient history: 81 year old woman who presented with an episode of loss of consciousness that is concerning for seizure activity.  EEG to evaluate for seizure. Level of alertness: Awake, asleep AEDs during EEG study: VPA, GBP Technical aspects: This EEG study was done with scalp electrodes  positioned according to the 10-20 International system of electrode placement. Electrical activity was acquired at a sampling rate of 500Hz and reviewed with a high frequency filter of 70Hz and a low frequency filter of 1Hz. EEG data were recorded continuously and digitally stored. Description: The posterior dominant rhythm consists of 8-9 Hz activity of moderate voltage (25-35 uV) seen predominantly in posterior head regions, symmetric and reactive to eye opening and eye closing. Sleep was characterized by vertex waves, sleep spindles (12 to 14 Hz), maximal frontocentral region. Hyperventilation and photic stimulation were not performed.   IMPRESSION: This study is within normal limits. No seizures or epileptiform discharges were seen throughout the recording. Lora Havens   ECHOCARDIOGRAM COMPLETE  Result Date: 08/19/2021    ECHOCARDIOGRAM REPORT   Patient Name:   [name] Date of Exam: 08/19/2021 Medical Rec #:  [id number]        Height:       67.0 in Accession #:    [id number]       Weight:       173.3 lb Date of Birth:  1941-02-24        BSA:          1.903 m Patient Age:    17 years         BP:           126/82 mmHg Patient Gender: F                HR:           61 bpm. Exam Location:  Inpatient Procedure: 2D Echo, 3D Echo, Cardiac Doppler, Color Doppler and Intracardiac            Opacification Agent Indications:    R55 Syncope  History:        Patient has prior history of Echocardiogram examinations, most  recent 02/08/2020. CAD, Stroke, Signs/Symptoms:Altered Mental                 Status, Alzheimer's, Chest Pain, Dizziness/Lightheadedness and                 Syncope; Risk Factors:Hypertension and Dyslipidemia.  Sonographer:    Roseanna Rainbow RDCS Referring Phys: [place]  1. Left ventricular ejection fraction, by estimation, is 55 to 60%. The left ventricle has normal function. The left ventricle has no regional wall motion abnormalities. Mild focal basal  septal left ventricular hypertrophy. Left ventricular diastolic parameters are consistent with Grade I diastolic dysfunction (impaired relaxation).  2. Right ventricular systolic function is normal. The right ventricular size is normal. Tricuspid regurgitation signal is inadequate for assessing PA pressure.  3. The mitral valve is normal in structure. Trivial mitral valve regurgitation. No evidence of mitral stenosis.  4. The aortic valve is tricuspid. There is mild calcification of the aortic valve. Aortic valve regurgitation is not visualized. No aortic stenosis is present.  5. The inferior vena cava is dilated in size with <50% respiratory variability, suggesting right atrial pressure of 15 mmHg. FINDINGS  Left Ventricle: Left ventricular ejection fraction, by estimation, is 55 to 60%. The left ventricle has normal function. The left ventricle has no regional wall motion abnormalities. Definity contrast agent was given IV to delineate the left ventricular  endocardial borders. The left ventricular internal cavity size was normal in size. Mild focal basal septal left ventricular hypertrophy. Left ventricular diastolic parameters are consistent with Grade I diastolic dysfunction (impaired relaxation). Right Ventricle: The right ventricular size is normal. No increase in right ventricular wall thickness. Right ventricular systolic function is normal. Tricuspid regurgitation signal is inadequate for assessing PA pressure. Left Atrium: Left atrial size was normal in size. Right Atrium: Right atrial size was normal in size. Pericardium: There is no evidence of pericardial effusion. Mitral Valve: The mitral valve is normal in structure. Mild mitral annular calcification. Trivial mitral valve regurgitation. No evidence of mitral valve stenosis. Tricuspid Valve: The tricuspid valve is normal in structure. Tricuspid valve regurgitation is not demonstrated. Aortic Valve: The aortic valve is tricuspid. There is mild  calcification of the aortic valve. Aortic valve regurgitation is not visualized. No aortic stenosis is present. Pulmonic Valve: The pulmonic valve was normal in structure. Pulmonic valve regurgitation is not visualized. Aorta: The aortic root is normal in size and structure. Venous: The inferior vena cava is dilated in size with less than 50% respiratory variability, suggesting right atrial pressure of 15 mmHg. IAS/Shunts: No atrial level shunt detected by color flow Doppler.  LEFT VENTRICLE PLAX 2D LVIDd:         4.50 cm     Diastology LVIDs:         2.90 cm     LV e' medial:    5.77 cm/s LV PW:         1.00 cm     LV E/e' medial:  14.7 LV IVS:        1.10 cm     LV e' lateral:   7.51 cm/s LVOT diam:     2.10 cm     LV E/e' lateral: 11.3 LV SV:         108 LV SV Index:   57 LVOT Area:     3.46 cm  LV Volumes (MOD) LV vol d, MOD A2C: 90.0 ml LV vol d, MOD A4C: 72.1 ml LV vol s,  MOD A2C: 23.0 ml LV vol s, MOD A4C: 28.8 ml LV SV MOD A2C:     67.0 ml LV SV MOD A4C:     72.1 ml LV SV MOD BP:      54.4 ml RIGHT VENTRICLE             IVC RV S prime:     10.60 cm/s  IVC diam: 2.60 cm TAPSE (M-mode): 2.0 cm LEFT ATRIUM             Index        RIGHT ATRIUM           Index LA diam:        2.50 cm 1.31 cm/m   RA Area:     17.30 cm LA Vol (A2C):   36.3 ml 19.08 ml/m  RA Volume:   45.90 ml  24.12 ml/m LA Vol (A4C):   24.2 ml 12.72 ml/m LA Biplane Vol: 29.9 ml 15.71 ml/m  AORTIC VALVE LVOT Vmax:   141.00 cm/s LVOT Vmean:  94.100 cm/s LVOT VTI:    0.311 m  AORTA Ao Root diam: 3.30 cm Ao Asc diam:  3.30 cm MITRAL VALVE MV Area (PHT): 3.32 cm    SHUNTS MV Decel Time: 229 msec    Systemic VTI:  0.31 m MV E velocity: 84.60 cm/s  Systemic Diam: 2.10 cm MV A velocity: 97.90 cm/s MV E/A ratio:  0.86 Dalton McleanMD Electronically signed by Franki Monte Signature Date/Time: 08/19/2021/5:37:23 PM    Final    VAS US CAROTID  Result Date: 08/20/2021 Carotid Arterial Duplex Study Patient Name:  [name]  Date of Exam:    08/19/2021 Medical Rec #: [id number]         Accession #:    [id number] Date of Birth: [birth year]         Patient Gender: F Patient Age:   34 years Exam Location:  [facility] Procedure:      VAS US CAROTID Referring Phys: Janine Ores --------------------------------------------------------------------------------  Indications:       CVA. Risk Factors:      Hypertension, hyperlipidemia, no history of smoking, coronary                    artery disease, prior CVA. Other Factors:     CHF, CKD, TIA. Comparison Study:  No previous exams Performing Technologist: Jody Hill RVT, RDMS  Examination Guidelines: A complete evaluation includes B-mode imaging, spectral Doppler, color Doppler, and power Doppler as needed of all accessible portions of each vessel. Bilateral testing is considered an integral part of a complete examination. Limited examinations for reoccurring indications may be performed as noted.  Right Carotid Findings: +----------+--------+--------+--------+------------------+--------+           PSV cm/sEDV cm/sStenosisPlaque DescriptionComments +----------+--------+--------+--------+------------------+--------+ CCA Prox  44      5                                          +----------+--------+--------+--------+------------------+--------+ CCA Distal53      14                                         +----------+--------+--------+--------+------------------+--------+ ICA Prox  70      16      1-39%  calcific                   +----------+--------+--------+--------+------------------+--------+ ICA Distal65      19                                         +----------+--------+--------+--------+------------------+--------+ ECA       85      0               calcific and focal         +----------+--------+--------+--------+------------------+--------+ +----------+--------+-------+----------------+-------------------+           PSV cm/sEDV cmsDescribe        Arm  Pressure (mmHG) +----------+--------+-------+----------------+-------------------+ [id number]             Multiphasic, WNL                    +----------+--------+-------+----------------+-------------------+ +---------+--------+--+--------+--+---------+ VertebralPSV cm/s50EDV cm/s11Antegrade +---------+--------+--+--------+--+---------+  Left Carotid Findings: +----------+--------+--------+--------+------------------+------------------+           PSV cm/sEDV cm/sStenosisPlaque DescriptionComments           +----------+--------+--------+--------+------------------+------------------+ CCA Prox  69      11                                                   +----------+--------+--------+--------+------------------+------------------+ CCA Distal52      12                                intimal thickening +----------+--------+--------+--------+------------------+------------------+ ICA Prox  144     36      40-59%  calcific                             +----------+--------+--------+--------+------------------+------------------+ ICA Mid   106     22                                                   +----------+--------+--------+--------+------------------+------------------+ ICA Distal119     25                                                   +----------+--------+--------+--------+------------------+------------------+ ECA       134     7               calcific          intimal thickening +----------+--------+--------+--------+------------------+------------------+ +----------+--------+--------+----------------+-------------------+           PSV cm/sEDV cm/sDescribe        Arm Pressure (mmHG) +----------+--------+--------+----------------+-------------------+ Subclavian101             Multiphasic, WNL                    +----------+--------+--------+----------------+-------------------+ +---------+--------+--+--------+--+---------+  VertebralPSV cm/s42EDV cm/s11Antegrade +---------+--------+--+--------+--+---------+   Summary: Right Carotid: Velocities in the right ICA are consistent with a 1-39% stenosis. Left Carotid: Velocities in the  left ICA are consistent with a 40-59% stenosis. Vertebrals:  Bilateral vertebral arteries demonstrate antegrade flow. Subclavians: Normal flow hemodynamics were seen in bilateral subclavian              arteries. *See table(s) above for measurements and observations.  Electronically signed by Antony Contras MD on 08/20/2021 at 8:53:28 AM.    Final    VAS [place] TRANSCRANIAL DOPPLER  Result Date: 08/20/2021  Transcranial Doppler Patient Name:  [facility] GARNER [name]  Date of Exam:   08/19/2021 Medical Rec #: [id number]         Accession #:    [id number] Date of Birth: 1940/09/23         Patient Gender: F Patient Age:   28 years Exam Location:  [facility] Procedure:      VAS [place] TRANSCRANIAL DOPPLER Referring Phys: Janine Ores --------------------------------------------------------------------------------  Indications: Stroke. Limitations for diagnostic windows: Unable to insonate right transtemporal window. Unable to insonate left transtemporal window. Comparison Study: No previous exams Performing Technologist: Jody Hill RVT, RDMS  Examination Guidelines: A complete evaluation includes B-mode imaging, spectral Doppler, color Doppler, and power Doppler as needed of all accessible portions of each vessel. Bilateral testing is considered an integral part of a complete examination. Limited examinations for reoccurring indications may be performed as noted.  +----------+-------------+----------+-----------+-------+ RIGHT TCD Right VM (cm)Depth (cm)PulsatilityComment +----------+-------------+----------+-----------+-------+ Opthalmic     10.00                                 +----------+-------------+----------+-----------+-------+ ICA siphon    14.00                                  +----------+-------------+----------+-----------+-------+ Vertebral    -25.00                                 +----------+-------------+----------+-----------+-------+ Distal ICA    20.00                                 +----------+-------------+----------+-----------+-------+  +----------+------------+----------+-----------+-------+ LEFT TCD  Left VM (cm)Depth (cm)PulsatilityComment +----------+------------+----------+-----------+-------+ Opthalmic    15.00                                 +----------+------------+----------+-----------+-------+ ICA siphon   16.00                                 +----------+------------+----------+-----------+-------+ Vertebral    -28.00                                +----------+------------+----------+-----------+-------+ Distal ICA   29.00                                 +----------+------------+----------+-----------+-------+  +------------+------+-------+             VM cm Comment +------------+------+-------+ Prox Basilar-28.00        +------------+------+-------+ Dist Basilar-30.00        +------------+------+-------+ Summary:  Absent bitemporal windows greatly limit exam. anteggrade flow noted  in both opthalmics, carotids, vertebrals a nd basilar arteries. *See table(s) above for TCD measurements and observations.  Diagnosing physician: Antony Contras MD Electronically signed by Antony Contras MD on 08/20/2021 at 10:36:05 AM.    Final      TODAY-DAY OF DISCHARGE:  Subjective:   [name] today has no headache,no chest abdominal pain,no new weakness tingling or numbness, feels much better wants to go home today.   Objective:   Blood pressure 134/72, pulse 65, temperature 98.2 F (36.8 C), resp. rate 17, height 5' 7" (1.702 m), weight 79.9 kg, SpO2 96 %.  Intake/Output Summary (Last 24 hours) at 08/20/2021 1050 Last data filed at 08/20/2021 0800 Gross per 24 hour  Intake 1013.26 ml  Output 400 ml  Net 613.26 ml    Filed Weights   08/18/21 0500 08/19/21 0540 08/20/21 0500  Weight: 78.4 kg 78.6 kg 79.9 kg    Exam: Awake Alert, Oriented *3, No new F.N deficits, Normal affect [place].AT,PERRAL Supple Neck,No JVD, No cervical lymphadenopathy appriciated.  Symmetrical Chest wall movement, Good air movement bilaterally, CTAB RRR,No Gallops,Rubs or new Murmurs, No Parasternal Heave +ve B.Sounds, Abd Soft, Non tender, No organomegaly appriciated, No rebound -guarding or rigidity. No Cyanosis, Clubbing or edema, No new Rash or bruise   PERTINENT RADIOLOGIC STUDIES: CT HEAD WO CONTRAST (5MM)  Result Date: 08/18/2021 CLINICAL DATA:  Altered mental status, migraine EXAM: CT HEAD WITHOUT CONTRAST TECHNIQUE: Contiguous axial images were obtained from the base of the skull through the vertex without intravenous contrast. RADIATION DOSE REDUCTION: This exam was performed according to the departmental dose-optimization program which includes automated exposure control, adjustment of the mA and/or kV according to patient size and/or use of iterative reconstruction technique. COMPARISON:  08/16/2021 FINDINGS: Brain: No evidence of acute infarction, hemorrhage, hydrocephalus, extra-axial collection or mass lesion/mass effect. Subcortical white matter and periventricular small vessel ischemic changes. Vascular: Intracranial atherosclerosis. Skull: Normal. Negative for fracture or focal lesion. Sinuses/Orbits: The visualized paranasal sinuses are essentially clear. The mastoid air cells are unopacified. Other: None. IMPRESSION: No acute intracranial abnormality. Small vessel ischemic changes. No interval change from recent CT. Electronically Signed   By: Julian Hy M.D.   On: 08/18/2021 17:59   CT CHEST WO CONTRAST  Result Date: 08/18/2021 CLINICAL DATA:  Right breast and axillary pain. History of right breast cancer. No injury. Recent negative diagnostic mammogram in February. EXAM: CT CHEST WITHOUT CONTRAST TECHNIQUE:  Multidetector CT imaging of the chest was performed following the standard protocol without IV contrast. RADIATION DOSE REDUCTION: This exam was performed according to the departmental dose-optimization program which includes automated exposure control, adjustment of the mA and/or kV according to patient size and/or use of iterative reconstruction technique. COMPARISON:  Chest x-Chohan dated Aug 16, 2021. FINDINGS: Cardiovascular: No significant vascular findings. Normal heart size. No pericardial effusion. No thoracic aortic aneurysm. Coronary, aortic arch, and branch vessel atherosclerotic vascular disease. Mediastinum/Nodes: No enlarged mediastinal or axillary lymph nodes. Thyroid gland, trachea, and esophagus demonstrate no significant findings. Lungs/Pleura: Trace right-greater-than-left pleural effusions. 5 mm solid pulmonary nodule in the posterior left lower lobe superior segment (series 5, image 77), unchanged since November 2021. 4 mm solid pulmonary nodules in the posterior right lower lobe (series 5, images 81 and 85), also unchanged since November 2021. Minimal dependent subsegmental atelectasis in both posterior lower lobes. No consolidation or pneumothorax. Upper Abdomen: No acute abnormality. Unchanged 2.9 cm left adrenal nodule, previously characterized as a benign adenoma. No follow-up imaging is recommended. Musculoskeletal: No  acute or significant osseous findings. Mild elevation of the right hemidiaphragm. Postsurgical changes in the right breast from prior lumpectomy. Advanced bilateral glenohumeral osteoarthritis with small joint effusion and intra-articular body on the left. IMPRESSION: 1. Postsurgical changes of the right breast without acute abnormality. See recent negative diagnostic mammogram results from February for follow-up recommendations. 2. Trace bilateral pleural effusions. 3. Few small pulmonary nodules in both lower lobes, unchanged since November 2021, benign. No further follow-up  required. 4. Aortic Atherosclerosis (ICD10-I70.0). Electronically Signed   By: Titus Dubin M.D.   On: 08/18/2021 13:43   MR BRAIN WO CONTRAST  Result Date: 08/18/2021 CLINICAL DATA:  Altered mental status, mild cognitive impairment, weakness EXAM: MRI HEAD WITHOUT CONTRAST TECHNIQUE: Multiplanar, multiecho pulse sequences of the brain and surrounding structures were obtained without intravenous contrast. COMPARISON:  02/08/2020 MRI brain FINDINGS: Brain: No restricted diffusion to suggest acute or subacute infarct. No acute hemorrhage, mass, mass effect, or midline shift. No hydrocephalus or extra-axial collection. No hemosiderin deposition to suggest remote hemorrhage. Confluent and scattered T2 hyperintense signal in the periventricular white matter and pons, likely the sequela of moderate chronic small vessel ischemic disease, which have increased from the prior exam. Degree of global cerebral volume loss is within normal limits for age and is without lobar predominance. Vascular: Normal arterial flow voids. Skull and upper cervical spine: Normal marrow signal. Degenerative changes in the cervical spine. Sinuses/Orbits: No acute finding. Status post bilateral lens replacements. Other: The mastoids are well aerated. IMPRESSION: No acute intracranial process. No evidence of acute or subacute infarct. Electronically Signed   By: Merilyn Baba M.D.   On: 08/18/2021 22:56   EEG adult  Result Date: 08/19/2021 Lora Havens, MD     08/19/2021 11:15 AM Patient Name: [name] MRN: [id number] Epilepsy Attending: Lora Havens Referring Physician/Provider: Lorenza Chick, MD Date: 08/19/2021 Duration: 21.45 mins Patient history: 81 year old woman who presented with an episode of loss of consciousness that is concerning for seizure activity.  EEG to evaluate for seizure. Level of alertness: Awake, asleep AEDs during EEG study: VPA, GBP Technical aspects: This EEG study was done with scalp  electrodes positioned according to the 10-20 International system of electrode placement. Electrical activity was acquired at a sampling rate of 500Hz and reviewed with a high frequency filter of 70Hz and a low frequency filter of 1Hz. EEG data were recorded continuously and digitally stored. Description: The posterior dominant rhythm consists of 8-9 Hz activity of moderate voltage (25-35 uV) seen predominantly in posterior head regions, symmetric and reactive to eye opening and eye closing. Sleep was characterized by vertex waves, sleep spindles (12 to 14 Hz), maximal frontocentral region. Hyperventilation and photic stimulation were not performed.   IMPRESSION: This study is within normal limits. No seizures or epileptiform discharges were seen throughout the recording. Lora Havens   ECHOCARDIOGRAM COMPLETE  Result Date: 08/19/2021    ECHOCARDIOGRAM REPORT   Patient Name:   [name] Date of Exam: 08/19/2021 Medical Rec #:  [id number]        Height:       67.0 in Accession #:    [id number]       Weight:       173.3 lb Date of Birth:  [birth year]        BSA:          1.903 m Patient Age:    70 years         BP:  126/82 mmHg Patient Gender: F                HR:           61 bpm. Exam Location:  Inpatient Procedure: 2D Echo, 3D Echo, Cardiac Doppler, Color Doppler and Intracardiac            Opacification Agent Indications:    R55 Syncope  History:        Patient has prior history of Echocardiogram examinations, most                 recent 02/08/2020. CAD, Stroke, Signs/Symptoms:Altered Mental                 Status, Alzheimer's, Chest Pain, Dizziness/Lightheadedness and                 Syncope; Risk Factors:Hypertension and Dyslipidemia.  Sonographer:    Roseanna Rainbow RDCS Referring Phys: [place]  1. Left ventricular ejection fraction, by estimation, is 55 to 60%. The left ventricle has normal function. The left ventricle has no regional wall motion abnormalities. Mild  focal basal septal left ventricular hypertrophy. Left ventricular diastolic parameters are consistent with Grade I diastolic dysfunction (impaired relaxation).  2. Right ventricular systolic function is normal. The right ventricular size is normal. Tricuspid regurgitation signal is inadequate for assessing PA pressure.  3. The mitral valve is normal in structure. Trivial mitral valve regurgitation. No evidence of mitral stenosis.  4. The aortic valve is tricuspid. There is mild calcification of the aortic valve. Aortic valve regurgitation is not visualized. No aortic stenosis is present.  5. The inferior vena cava is dilated in size with <50% respiratory variability, suggesting right atrial pressure of 15 mmHg. FINDINGS  Left Ventricle: Left ventricular ejection fraction, by estimation, is 55 to 60%. The left ventricle has normal function. The left ventricle has no regional wall motion abnormalities. Definity contrast agent was given IV to delineate the left ventricular  endocardial borders. The left ventricular internal cavity size was normal in size. Mild focal basal septal left ventricular hypertrophy. Left ventricular diastolic parameters are consistent with Grade I diastolic dysfunction (impaired relaxation). Right Ventricle: The right ventricular size is normal. No increase in right ventricular wall thickness. Right ventricular systolic function is normal. Tricuspid regurgitation signal is inadequate for assessing PA pressure. Left Atrium: Left atrial size was normal in size. Right Atrium: Right atrial size was normal in size. Pericardium: There is no evidence of pericardial effusion. Mitral Valve: The mitral valve is normal in structure. Mild mitral annular calcification. Trivial mitral valve regurgitation. No evidence of mitral valve stenosis. Tricuspid Valve: The tricuspid valve is normal in structure. Tricuspid valve regurgitation is not demonstrated. Aortic Valve: The aortic valve is tricuspid. There is  mild calcification of the aortic valve. Aortic valve regurgitation is not visualized. No aortic stenosis is present. Pulmonic Valve: The pulmonic valve was normal in structure. Pulmonic valve regurgitation is not visualized. Aorta: The aortic root is normal in size and structure. Venous: The inferior vena cava is dilated in size with less than 50% respiratory variability, suggesting right atrial pressure of 15 mmHg. IAS/Shunts: No atrial level shunt detected by color flow Doppler.  LEFT VENTRICLE PLAX 2D LVIDd:         4.50 cm     Diastology LVIDs:         2.90 cm     LV e' medial:    5.77 cm/s LV PW:  1.00 cm     LV E/e' medial:  14.7 LV IVS:        1.10 cm     LV e' lateral:   7.51 cm/s LVOT diam:     2.10 cm     LV E/e' lateral: 11.3 LV SV:         108 LV SV Index:   57 LVOT Area:     3.46 cm  LV Volumes (MOD) LV vol d, MOD A2C: 90.0 ml LV vol d, MOD A4C: 72.1 ml LV vol s, MOD A2C: 23.0 ml LV vol s, MOD A4C: 28.8 ml LV SV MOD A2C:     67.0 ml LV SV MOD A4C:     72.1 ml LV SV MOD BP:      54.4 ml RIGHT VENTRICLE             IVC RV S prime:     10.60 cm/s  IVC diam: 2.60 cm TAPSE (M-mode): 2.0 cm LEFT ATRIUM             Index        RIGHT ATRIUM           Index LA diam:        2.50 cm 1.31 cm/m   RA Area:     17.30 cm LA Vol (A2C):   36.3 ml 19.08 ml/m  RA Volume:   45.90 ml  24.12 ml/m LA Vol (A4C):   24.2 ml 12.72 ml/m LA Biplane Vol: 29.9 ml 15.71 ml/m  AORTIC VALVE LVOT Vmax:   141.00 cm/s LVOT Vmean:  94.100 cm/s LVOT VTI:    0.311 m  AORTA Ao Root diam: 3.30 cm Ao Asc diam:  3.30 cm MITRAL VALVE MV Area (PHT): 3.32 cm    SHUNTS MV Decel Time: 229 msec    Systemic VTI:  0.31 m MV E velocity: 84.60 cm/s  Systemic Diam: 2.10 cm MV A velocity: 97.90 cm/s MV E/A ratio:  0.86 Dalton McleanMD Electronically signed by Franki Monte Signature Date/Time: 08/19/2021/5:37:23 PM    Final    VAS US CAROTID  Result Date: 08/20/2021 Carotid Arterial Duplex Study Patient Name:  [name]  Date of  Exam:   08/19/2021 Medical Rec #: [id number]         Accession #:    [id number] Date of Birth: Oct 13, 1940         Patient Gender: F Patient Age:   57 years Exam Location:  [facility] Procedure:      VAS US CAROTID Referring Phys: Janine Ores --------------------------------------------------------------------------------  Indications:       CVA. Risk Factors:      Hypertension, hyperlipidemia, no history of smoking, coronary                    artery disease, prior CVA. Other Factors:     CHF, CKD, TIA. Comparison Study:  No previous exams Performing Technologist: Jody Hill RVT, RDMS  Examination Guidelines: A complete evaluation includes B-mode imaging, spectral Doppler, color Doppler, and power Doppler as needed of all accessible portions of each vessel. Bilateral testing is considered an integral part of a complete examination. Limited examinations for reoccurring indications may be performed as noted.  Right Carotid Findings: +----------+--------+--------+--------+------------------+--------+           PSV cm/sEDV cm/sStenosisPlaque DescriptionComments +----------+--------+--------+--------+------------------+--------+ CCA Prox  44      5                                          +----------+--------+--------+--------+------------------+--------+  CCA Distal53      14                                         +----------+--------+--------+--------+------------------+--------+ ICA Prox  70      16      1-39%   calcific                   +----------+--------+--------+--------+------------------+--------+ ICA Distal65      19                                         +----------+--------+--------+--------+------------------+--------+ ECA       85      0               calcific and focal         +----------+--------+--------+--------+------------------+--------+ +----------+--------+-------+----------------+-------------------+           PSV cm/sEDV cmsDescribe         Arm Pressure (mmHG) +----------+--------+-------+----------------+-------------------+ [id number]             Multiphasic, WNL                    +----------+--------+-------+----------------+-------------------+ +---------+--------+--+--------+--+---------+ VertebralPSV cm/s50EDV cm/s11Antegrade +---------+--------+--+--------+--+---------+  Left Carotid Findings: +----------+--------+--------+--------+------------------+------------------+           PSV cm/sEDV cm/sStenosisPlaque DescriptionComments           +----------+--------+--------+--------+------------------+------------------+ CCA Prox  69      11                                                   +----------+--------+--------+--------+------------------+------------------+ CCA Distal52      12                                intimal thickening +----------+--------+--------+--------+------------------+------------------+ ICA Prox  144     36      40-59%  calcific                             +----------+--------+--------+--------+------------------+------------------+ ICA Mid   106     22                                                   +----------+--------+--------+--------+------------------+------------------+ ICA Distal119     25                                                   +----------+--------+--------+--------+------------------+------------------+ ECA       134     7               calcific          intimal thickening +----------+--------+--------+--------+------------------+------------------+ +----------+--------+--------+----------------+-------------------+           PSV cm/sEDV cm/sDescribe  Arm Pressure (mmHG) +----------+--------+--------+----------------+-------------------+ Subclavian101             Multiphasic, WNL                    +----------+--------+--------+----------------+-------------------+ +---------+--------+--+--------+--+---------+  VertebralPSV cm/s42EDV cm/s11Antegrade +---------+--------+--+--------+--+---------+   Summary: Right Carotid: Velocities in the right ICA are consistent with a 1-39% stenosis. Left Carotid: Velocities in the left ICA are consistent with a 40-59% stenosis. Vertebrals:  Bilateral vertebral arteries demonstrate antegrade flow. Subclavians: Normal flow hemodynamics were seen in bilateral subclavian              arteries. *See table(s) above for measurements and observations.  Electronically signed by Antony Contras MD on 08/20/2021 at 8:53:28 AM.    Final    VAS [place] TRANSCRANIAL DOPPLER  Result Date: 08/20/2021  Transcranial Doppler Patient Name:  [facility] GARNER [name]  Date of Exam:   08/19/2021 Medical Rec #: [id number]         Accession #:    [id number] Date of Birth: 06-15-1940         Patient Gender: F Patient Age:   10 years Exam Location:  [facility] Procedure:      VAS [place] TRANSCRANIAL DOPPLER Referring Phys: Janine Ores --------------------------------------------------------------------------------  Indications: Stroke. Limitations for diagnostic windows: Unable to insonate right transtemporal window. Unable to insonate left transtemporal window. Comparison Study: No previous exams Performing Technologist: Jody Hill RVT, RDMS  Examination Guidelines: A complete evaluation includes B-mode imaging, spectral Doppler, color Doppler, and power Doppler as needed of all accessible portions of each vessel. Bilateral testing is considered an integral part of a complete examination. Limited examinations for reoccurring indications may be performed as noted.  +----------+-------------+----------+-----------+-------+ RIGHT TCD Right VM (cm)Depth (cm)PulsatilityComment +----------+-------------+----------+-----------+-------+ Opthalmic     10.00                                 +----------+-------------+----------+-----------+-------+ ICA siphon    14.00                                  +----------+-------------+----------+-----------+-------+ Vertebral    -25.00                                 +----------+-------------+----------+-----------+-------+ Distal ICA    20.00                                 +----------+-------------+----------+-----------+-------+  +----------+------------+----------+-----------+-------+ LEFT TCD  Left VM (cm)Depth (cm)PulsatilityComment +----------+------------+----------+-----------+-------+ Opthalmic    15.00                                 +----------+------------+----------+-----------+-------+ ICA siphon   16.00                                 +----------+------------+----------+-----------+-------+ Vertebral    -28.00                                +----------+------------+----------+-----------+-------+ Distal ICA   29.00                                 +----------+------------+----------+-----------+-------+  +------------+------+-------+  VM cm Comment +------------+------+-------+ Prox Basilar-28.00        +------------+------+-------+ Dist Basilar-30.00        +------------+------+-------+ Summary:  Absent bitemporal windows greatly limit exam. anteggrade flow noted in both opthalmics, carotids, vertebrals a nd basilar arteries. *See table(s) above for TCD measurements and observations.  Diagnosing physician: Antony Contras MD Electronically signed by Antony Contras MD on 08/20/2021 at 10:36:05 AM.    Final      PERTINENT LAB RESULTS: CBC: Recent Labs    08/18/21 0259  WBC 8.1  HGB 12.0  HCT 36.1  PLT 163   CMET CMP     Component Value Date/Time   NA 138 08/20/2021 0607   NA 140 08/01/2017 1143   K 3.9 08/20/2021 0607   CL 99 08/20/2021 0607   CO2 30 08/20/2021 0607   GLUCOSE 171 (H) 08/20/2021 0607   BUN 17 08/20/2021 0607   BUN 25 08/01/2017 1143   CREATININE 1.53 (H) 08/20/2021 0607   CREATININE 1.61 (H) 12/05/2019 1003   CREATININE 0.94 01/09/2013 0839   CALCIUM 9.8  08/20/2021 0607   PROT 6.2 (L) 08/17/2021 0505   PROT 7.1 08/01/2017 1143   ALBUMIN 3.1 (L) 08/17/2021 0505   ALBUMIN 4.7 08/01/2017 1143   AST 25 08/17/2021 0505   AST 39 12/05/2019 1003   ALT 23 08/17/2021 0505   ALT 26 12/05/2019 1003   ALKPHOS 62 08/17/2021 0505   BILITOT 0.6 08/17/2021 0505   BILITOT 0.6 12/05/2019 1003   GFRNONAA 34 (L) 08/20/2021 0607   GFRNONAA 30 (L) 12/05/2019 1003   GFRAA 34 (L) 12/05/2019 1003    GFR Estimated Creatinine Clearance: 30.3 mL/min (A) (by C-G formula based on SCr of 1.53 mg/dL (H)). No results for input(s): LIPASE, AMYLASE in the last 72 hours. No results for input(s): CKTOTAL, CKMB, CKMBINDEX, TROPONINI in the last 72 hours. Invalid input(s): POCBNP No results for input(s): DDIMER in the last 72 hours. Recent Labs    08/18/21 0259  HGBA1C 9.3*   Recent Labs    08/19/21 0439  CHOL 101  HDL 30*  LDLCALC 47  TRIG 119  CHOLHDL 3.4   No results for input(s): TSH, T4TOTAL, T3FREE, THYROIDAB in the last 72 hours.  Invalid input(s): FREET3 No results for input(s): VITAMINB12, FOLATE, FERRITIN, TIBC, IRON, RETICCTPCT in the last 72 hours. Coags: No results for input(s): INR in the last 72 hours.  Invalid input(s): PT Microbiology: Recent Results (from the past 240 hour(s))  Urine Culture     Status: Abnormal   Collection Time: 08/17/21 10:04 AM   Specimen: Urine, Clean Catch  Result Value Ref Range Status   Specimen Description URINE, CLEAN CATCH  Final   Special Requests   Final    NONE Performed at [facility], [address], [place], [place] [address]    Culture (A)  Final    >=100,000 COLONIES/mL ENTEROCOCCUS GALLINARUM VANCOMYCIN RESISTANT ENTEROCOCCUS ISOLATED    Report Status 08/19/2021 FINAL  Final   Organism ID, Bacteria ENTEROCOCCUS GALLINARUM (A)  Final      Susceptibility   Enterococcus gallinarum - MIC*    AMPICILLIN <=2 SENSITIVE Sensitive     NITROFURANTOIN <=16 SENSITIVE Sensitive      VANCOMYCIN RESISTANT Resistant     LINEZOLID 2 SENSITIVE Sensitive     * >=100,000 COLONIES/mL ENTEROCOCCUS GALLINARUM    FURTHER DISCHARGE INSTRUCTIONS:  Get Medicines reviewed and adjusted: Please take all your medications with you for your next visit with your  Primary MD  Laboratory/radiological data: Please request your Primary MD to go over all hospital tests and procedure/radiological results at the follow up, please ask your Primary MD to get all Hospital records sent to his/her office.  In some cases, they will be blood work, cultures and biopsy results pending at the time of your discharge. Please request that your primary care M.D. goes through all the records of your hospital data and follows up on these results.  Also Note the following: If you experience worsening of your admission symptoms, develop shortness of breath, life threatening emergency, suicidal or homicidal thoughts you must seek medical attention immediately by calling 911 or calling your MD immediately  if symptoms less severe.  You must read complete instructions/literature along with all the possible adverse reactions/side effects for all the Medicines you take and that have been prescribed to you. Take any new Medicines after you have completely understood and accpet all the possible adverse reactions/side effects.   Do not drive when taking Pain medications or sleeping medications (Benzodaizepines)  Do not take more than prescribed Pain, Sleep and Anxiety Medications. It is not advisable to combine anxiety,sleep and pain medications without talking with your primary care practitioner  Special Instructions: If you have smoked or chewed Tobacco  in the last 2 yrs please stop smoking, stop any regular Alcohol  and or any Recreational drug use.  Wear Seat belts while driving.  Please note: You were cared for by a hospitalist during your hospital stay. Once you are discharged, your primary care physician will  handle any further medical issues. Please note that NO REFILLS for any discharge medications will be authorized once you are discharged, as it is imperative that you return to your primary care physician (or establish a relationship with a primary care physician if you do not have one) for your post hospital discharge needs so that they can reassess your need for medications and monitor your lab values.  Total Time spent coordinating discharge including counseling, education and face to face time equals greater than 30 minutes.  Signed: Oren Binet 08/20/2021 10:50 AM

## 2021-08-20 NOTE — Progress Notes (Signed)
PT found standing in room very confused and agitated. PT accusing granddaughter of leaving her for days alone in room when in fact she had been away for only few hours. PT did not remember talking with Beth on the phone about 1 1/2 hours before. PT with bizarre behavior which was a change from before. PT had been very pleasant and oriented appropriately. Night shift RN also at bedside to observe. PT was able to be talked down and calmed.

## 2021-08-21 DIAGNOSIS — R55 Syncope and collapse: Secondary | ICD-10-CM | POA: Diagnosis not present

## 2021-08-26 DIAGNOSIS — B0223 Postherpetic polyneuropathy: Secondary | ICD-10-CM | POA: Diagnosis not present

## 2021-08-26 DIAGNOSIS — M1A0720 Idiopathic chronic gout, left ankle and foot, without tophus (tophi): Secondary | ICD-10-CM | POA: Diagnosis not present

## 2021-08-26 DIAGNOSIS — G43909 Migraine, unspecified, not intractable, without status migrainosus: Secondary | ICD-10-CM | POA: Diagnosis not present

## 2021-08-26 DIAGNOSIS — I130 Hypertensive heart and chronic kidney disease with heart failure and stage 1 through stage 4 chronic kidney disease, or unspecified chronic kidney disease: Secondary | ICD-10-CM | POA: Diagnosis not present

## 2021-08-26 DIAGNOSIS — I2510 Atherosclerotic heart disease of native coronary artery without angina pectoris: Secondary | ICD-10-CM | POA: Diagnosis not present

## 2021-08-26 DIAGNOSIS — E1122 Type 2 diabetes mellitus with diabetic chronic kidney disease: Secondary | ICD-10-CM | POA: Diagnosis not present

## 2021-08-26 DIAGNOSIS — G9341 Metabolic encephalopathy: Secondary | ICD-10-CM | POA: Diagnosis not present

## 2021-08-26 DIAGNOSIS — E871 Hypo-osmolality and hyponatremia: Secondary | ICD-10-CM | POA: Diagnosis not present

## 2021-08-26 DIAGNOSIS — D631 Anemia in chronic kidney disease: Secondary | ICD-10-CM | POA: Diagnosis not present

## 2021-08-26 DIAGNOSIS — M1990 Unspecified osteoarthritis, unspecified site: Secondary | ICD-10-CM | POA: Diagnosis not present

## 2021-08-26 DIAGNOSIS — Z7901 Long term (current) use of anticoagulants: Secondary | ICD-10-CM | POA: Diagnosis not present

## 2021-08-26 DIAGNOSIS — K219 Gastro-esophageal reflux disease without esophagitis: Secondary | ICD-10-CM | POA: Diagnosis not present

## 2021-08-26 DIAGNOSIS — Z7902 Long term (current) use of antithrombotics/antiplatelets: Secondary | ICD-10-CM | POA: Diagnosis not present

## 2021-08-26 DIAGNOSIS — B952 Enterococcus as the cause of diseases classified elsewhere: Secondary | ICD-10-CM | POA: Diagnosis not present

## 2021-08-26 DIAGNOSIS — E860 Dehydration: Secondary | ICD-10-CM | POA: Diagnosis not present

## 2021-08-26 DIAGNOSIS — N390 Urinary tract infection, site not specified: Secondary | ICD-10-CM | POA: Diagnosis not present

## 2021-08-26 DIAGNOSIS — E785 Hyperlipidemia, unspecified: Secondary | ICD-10-CM | POA: Diagnosis not present

## 2021-08-26 DIAGNOSIS — I5032 Chronic diastolic (congestive) heart failure: Secondary | ICD-10-CM | POA: Diagnosis not present

## 2021-08-26 DIAGNOSIS — N179 Acute kidney failure, unspecified: Secondary | ICD-10-CM | POA: Diagnosis not present

## 2021-08-26 DIAGNOSIS — N184 Chronic kidney disease, stage 4 (severe): Secondary | ICD-10-CM | POA: Diagnosis not present

## 2021-08-26 DIAGNOSIS — F03A4 Unspecified dementia, mild, with anxiety: Secondary | ICD-10-CM | POA: Diagnosis not present

## 2021-08-26 DIAGNOSIS — G2581 Restless legs syndrome: Secondary | ICD-10-CM | POA: Diagnosis not present

## 2021-08-26 DIAGNOSIS — E876 Hypokalemia: Secondary | ICD-10-CM | POA: Diagnosis not present

## 2021-08-26 DIAGNOSIS — E11610 Type 2 diabetes mellitus with diabetic neuropathic arthropathy: Secondary | ICD-10-CM | POA: Diagnosis not present

## 2021-08-26 DIAGNOSIS — E1142 Type 2 diabetes mellitus with diabetic polyneuropathy: Secondary | ICD-10-CM | POA: Diagnosis not present

## 2021-08-29 ENCOUNTER — Emergency Department (HOSPITAL_COMMUNITY)
Admission: EM | Admit: 2021-08-29 | Discharge: 2021-08-29 | Disposition: A | Discharge status: Home / Self Care | Payer: Medicare Other | Attending: Emergency Medicine | Admitting: Emergency Medicine

## 2021-08-29 ENCOUNTER — Encounter (HOSPITAL_COMMUNITY): Payer: Self-pay

## 2021-08-29 ENCOUNTER — Emergency Department (HOSPITAL_COMMUNITY): Payer: Medicare Other

## 2021-08-29 ENCOUNTER — Other Ambulatory Visit: Payer: Self-pay

## 2021-08-29 DIAGNOSIS — E876 Hypokalemia: Secondary | ICD-10-CM | POA: Diagnosis not present

## 2021-08-29 DIAGNOSIS — Z79899 Other long term (current) drug therapy: Secondary | ICD-10-CM | POA: Diagnosis not present

## 2021-08-29 DIAGNOSIS — I10 Essential (primary) hypertension: Secondary | ICD-10-CM | POA: Insufficient documentation

## 2021-08-29 DIAGNOSIS — I2510 Atherosclerotic heart disease of native coronary artery without angina pectoris: Secondary | ICD-10-CM | POA: Diagnosis not present

## 2021-08-29 DIAGNOSIS — R0789 Other chest pain: Secondary | ICD-10-CM | POA: Insufficient documentation

## 2021-08-29 DIAGNOSIS — R0602 Shortness of breath: Secondary | ICD-10-CM | POA: Diagnosis not present

## 2021-08-29 DIAGNOSIS — E119 Type 2 diabetes mellitus without complications: Secondary | ICD-10-CM | POA: Diagnosis not present

## 2021-08-29 DIAGNOSIS — R079 Chest pain, unspecified: Secondary | ICD-10-CM | POA: Diagnosis not present

## 2021-08-29 DIAGNOSIS — I700 Atherosclerosis of aorta: Secondary | ICD-10-CM | POA: Diagnosis not present

## 2021-08-29 DIAGNOSIS — R0689 Other abnormalities of breathing: Secondary | ICD-10-CM | POA: Diagnosis not present

## 2021-08-29 DIAGNOSIS — I499 Cardiac arrhythmia, unspecified: Secondary | ICD-10-CM | POA: Diagnosis not present

## 2021-08-29 DIAGNOSIS — Z7901 Long term (current) use of anticoagulants: Secondary | ICD-10-CM | POA: Insufficient documentation

## 2021-08-29 DIAGNOSIS — Z794 Long term (current) use of insulin: Secondary | ICD-10-CM | POA: Diagnosis not present

## 2021-08-29 LAB — CBC WITH DIFFERENTIAL/PLATELET
Abs Immature Granulocytes: 0.04 K/uL (ref 0.00–0.07)
Basophils Absolute: 0 K/uL (ref 0.0–0.1)
Basophils Relative: 0 %
Eosinophils Absolute: 0.1 K/uL (ref 0.0–0.5)
Eosinophils Relative: 1 %
HCT: 39.4 % (ref 36.0–46.0)
Hemoglobin: 13.6 g/dL (ref 12.0–15.0)
Immature Granulocytes: 0 %
Lymphocytes Relative: 18 %
Lymphs Abs: 1.6 K/uL (ref 0.7–4.0)
MCH: 30.2 pg (ref 26.0–34.0)
MCHC: 34.5 g/dL (ref 30.0–36.0)
MCV: 87.4 fL (ref 80.0–100.0)
Monocytes Absolute: 0.5 K/uL (ref 0.1–1.0)
Monocytes Relative: 6 %
Neutro Abs: 6.6 K/uL (ref 1.7–7.7)
Neutrophils Relative %: 75 %
Platelets: 215 K/uL (ref 150–400)
RBC: 4.51 MIL/uL (ref 3.87–5.11)
RDW: 13.7 % (ref 11.5–15.5)
WBC: 9 K/uL (ref 4.0–10.5)
nRBC: 0 % (ref 0.0–0.2)

## 2021-08-29 LAB — COMPREHENSIVE METABOLIC PANEL
ALT: 34 U/L (ref 0–44)
AST: 36 U/L (ref 15–41)
Albumin: 3.7 g/dL (ref 3.5–5.0)
Alkaline Phosphatase: 71 U/L (ref 38–126)
Anion gap: 14 (ref 5–15)
BUN: 20 mg/dL (ref 8–23)
CO2: 25 mmol/L (ref 22–32)
Calcium: 9.4 mg/dL (ref 8.9–10.3)
Chloride: 93 mmol/L — ABNORMAL LOW (ref 98–111)
Creatinine, Ser: 1.55 mg/dL — ABNORMAL HIGH (ref 0.44–1.00)
GFR, Estimated: 33 mL/min — ABNORMAL LOW (ref >60)
Glucose, Bld: 166 mg/dL — ABNORMAL HIGH (ref 70–99)
Potassium: 3 mmol/L — ABNORMAL LOW (ref 3.5–5.1)
Sodium: 132 mmol/L — ABNORMAL LOW (ref 135–145)
Total Bilirubin: 0.8 mg/dL (ref 0.3–1.2)
Total Protein: 7.4 g/dL (ref 6.5–8.1)

## 2021-08-29 LAB — URINALYSIS, ROUTINE W REFLEX MICROSCOPIC
Bilirubin Urine: NEGATIVE
Glucose, UA: NEGATIVE mg/dL
Hgb urine dipstick: NEGATIVE
Ketones, ur: NEGATIVE mg/dL
Leukocytes,Ua: NEGATIVE
Nitrite: NEGATIVE
Protein, ur: NEGATIVE mg/dL
Specific Gravity, Urine: 1.01 (ref 1.005–1.030)
pH: 7 (ref 5.0–8.0)

## 2021-08-29 LAB — D-DIMER, QUANTITATIVE: D-Dimer, Quant: 0.37 ug/mL-FEU (ref 0.00–0.50)

## 2021-08-29 LAB — TROPONIN I (HIGH SENSITIVITY)
Troponin I (High Sensitivity): 5 ng/L (ref <18)
Troponin I (High Sensitivity): 6 ng/L (ref <18)

## 2021-08-29 MED ORDER — POTASSIUM CHLORIDE CRYS ER 20 MEQ PO TBCR
40.0000 meq | EXTENDED_RELEASE_TABLET | Freq: Once | ORAL | Status: AC
  Administered 2021-08-29: 40 meq via ORAL
  Filled 2021-08-29: qty 2

## 2021-08-29 MED ORDER — POTASSIUM CHLORIDE ER 10 MEQ PO TBCR
10.0000 meq | EXTENDED_RELEASE_TABLET | Freq: Every day | ORAL | 0 refills | Status: DC
Start: 2021-08-29 — End: 2021-10-06

## 2021-08-29 NOTE — ED Provider Notes (Signed)
[facility] DE[facility] Provider Note   CSN: [id number] Arrival date & time: 08/29/21  1000     History  No chief complaint on file.   [name] is a 81 y.o. female.  Patient complains of chest discomfort.  She has a history of diabetes hypertension and coronary artery disease.  She was just in the hospital about 10 days ago.  The history is provided by the patient and medical records. No language interpreter was used.  Chest Pain Pain location:  Substernal area Pain quality: aching   Pain radiates to:  Does not radiate Pain severity:  Moderate Onset quality:  Sudden Timing:  Intermittent Progression:  Waxing and waning Chronicity:  Recurrent Context: not breathing   Relieved by:  Nothing Worsened by:  Nothing Associated symptoms: no abdominal pain, no back pain, no cough, no fatigue and no headache       Home Medications Prior to Admission medications   Medication Sig Start Date End Date Taking? Authorizing Provider  acetaminophen-codeine (TYLENOL #3) 300-30 MG tablet Take 1 tablet by mouth every 4 (four) hours as needed for moderate pain.   Yes [provider]  ALPRAZolam (XANAX) 0.25 MG tablet Take 1-2 tablets (0.25-0.5 mg total) by mouth 2 (two) times daily as needed for anxiety. Patient taking differently: Take 0.25 mg by mouth 3 (three) times daily as needed for anxiety. 04/17/21  Yes Truitt Merle, MD  amitriptyline (ELAVIL) 25 MG tablet Take 1 tablet (25 mg total) by mouth at bedtime. 08/20/21  Yes Ghimire, Henreitta Leber, MD  Biotin 10000 MCG TABS Take 1 tablet by mouth every evening.   Yes [provider]  bumetanide (BUMEX) 2 MG tablet Take 4 mg by mouth daily.   Yes [provider]  Cholecalciferol (VITAMIN D3) 50 MCG (2000 UT) capsule Take 2,000 Units by mouth every evening.   Yes [provider]  Coenzyme Q10 (COQ10 PO) Take 1 capsule by mouth every evening.   Yes [provider]  COLCRYS 0.6 MG  tablet TAKE 1 TABLET DAILY AS NEEDED FOR GOUT FLARE. Patient taking differently: Take 0.6 mg by mouth daily as needed (gout). 12/27/18  Yes Newt Minion, MD  CRANBERRY PO Take 4,200 mg by mouth every evening.   Yes [provider]  ELIQUIS 5 MG TABS tablet Take 5 mg by mouth 2 (two) times daily. 07/13/21  Yes [provider]  exemestane (AROMASIN) 25 MG tablet Take 1 tablet (25 mg total) by mouth daily after breakfast. TAKE 1 TABLET DAILY AFTER BREAKFAST Patient taking differently: Take 25 mg by mouth daily after breakfast. 04/17/21  Yes Truitt Merle, MD  febuxostat (ULORIC) 40 MG tablet Take 40 mg by mouth daily. 12/31/19  Yes [provider]  gabapentin (NEURONTIN) 300 MG capsule Take 300 mg by mouth at bedtime. 12/18/19  Yes [provider]  glipiZIDE (GLUCOTROL) 5 MG tablet Take 5 mg by mouth daily before breakfast.   Yes [provider]  insulin glargine (LANTUS) 100 UNIT/ML Solostar Pen Inject 5 Units into the skin daily. 07/21/20  Yes Hall, Carole N, DO  JANUVIA 25 MG tablet Take 25 mg by mouth daily. 12/26/19  Yes [provider]  Javier Docker Oil 500 MG CAPS Take 500 mg by mouth daily.   Yes [provider]  Methylfol-Algae-B12-Acetylcyst (CEREFOLIN NAC PO) Take 1 tablet by mouth in the morning.   Yes [provider]  metoprolol succinate (TOPROL-XL) 25 MG 24 hr tablet Take 25 mg  by mouth at bedtime.  12/27/19  Yes [provider]  nitroGLYCERIN (NITROLINGUAL) 0.4 MG/SPRAY spray Place 1 spray under the tongue every 5 (five) minutes x 3 doses as needed for chest pain.   Yes [provider]  pantoprazole (PROTONIX) 40 MG tablet Take 1 tablet (40 mg total) by mouth 2 (two) times daily before a meal. 03/02/20  Yes Dahal, Marlowe Aschoff, MD  pramipexole (MIRAPEX) 0.25 MG tablet TAKE 1 TO 2 TABLETS AS INSTRUCTED, 1 TABLET AT 4:00 P.M. AND 2 TABLETS AT BEDTIME Patient taking differently: Take 0.75 mg by mouth at bedtime. 09/27/19  Yes  McCue, Janett Billow, NP  rosuvastatin (CRESTOR) 20 MG tablet Take 20 mg by mouth daily.   Yes [provider]  Blood Glucose Monitoring Suppl (FREESTYLE LITE) w/Device KIT use as directed 07/21/20   Kayleen Memos, DO  glucose blood (FREESTYLE LITE) test strip Test blood sugars up to 4 times daily 07/21/20   Kayleen Memos, DO  Insulin Pen Needle (PENTIPS) 32G X 4 MM MISC Use as directed 07/21/20   Kayleen Memos, DO  Insulin Pen Needle 32G X 4 MM MISC 5 Units by Does not apply route in the morning and at bedtime. 07/21/20   Kayleen Memos, DO  insulin regular (NOVOLIN R) 100 units/mL injection Inject into the skin 3 (three) times daily before meals. Started last week 4/28: Sliding scale before meals: <150 none,                                                                           151-199 - 2 units                                                                            200-249 - 4 units                                                                            250-299 - 6 units                                                                            300-349 - 8 units  350> 10 units Patient not taking: Reported on 08/29/2021    [provider]  Lancets (FREESTYLE) lancets Test blood sugars up to 4 times daily 07/21/20   Kayleen Memos, DO  Multiple Vitamin (MULTIVITAMIN WITH MINERALS) TABS tablet Take 1 tablet by mouth daily. Patient not taking: Reported on 08/16/2021 03/03/20   Terrilee Croak, MD      Allergies    Nsaids, Reglan [metoclopramide], Ultram [tramadol], Lipitor [atorvastatin], Lyrica [pregabalin], and Urecholine [bethanechol]    Review of Systems   Review of Systems  Constitutional:  Negative for appetite change and fatigue.  HENT:  Negative for congestion, ear discharge and sinus pressure.   Eyes:  Negative for discharge.  Respiratory:  Negative for cough.   Cardiovascular:  Positive for  chest pain.  Gastrointestinal:  Negative for abdominal pain and diarrhea.  Genitourinary:  Negative for frequency and hematuria.  Musculoskeletal:  Negative for back pain.  Skin:  Negative for rash.  Neurological:  Negative for seizures and headaches.  Psychiatric/Behavioral:  Negative for hallucinations.    Physical Exam Updated Vital Signs BP 140/82   Pulse 75   Temp (!) 97.5 F (36.4 C) (Oral)   Resp 16   Ht _0  (1.676 m)   Wt 77.1 kg   SpO2 96%   BMI 27.44 kg/m  Physical Exam Vitals and nursing note reviewed.  Constitutional:      Appearance: She is well-developed.  HENT:     Head: Normocephalic.     Nose: Nose normal.  Eyes:     General: No scleral icterus.    Conjunctiva/sclera: Conjunctivae normal.  Neck:     Thyroid: No thyromegaly.  Cardiovascular:     Rate and Rhythm: Normal rate and regular rhythm.     Heart sounds: No murmur heard.   No friction rub. No gallop.  Pulmonary:     Breath sounds: No stridor. No wheezing or rales.  Chest:     Chest wall: No tenderness.  Abdominal:     General: There is no distension.     Tenderness: There is no abdominal tenderness. There is no rebound.  Musculoskeletal:        General: Normal range of motion.     Cervical back: Neck supple.  Lymphadenopathy:     Cervical: No cervical adenopathy.  Skin:    Findings: No erythema or rash.  Neurological:     Mental Status: She is alert and oriented to person, place, and time.     Motor: No abnormal muscle tone.     Coordination: Coordination normal.  Psychiatric:        Behavior: Behavior normal.    ED Results / Procedures / Treatments   Labs (all labs ordered are listed, but only abnormal results are displayed) Labs Reviewed  COMPREHENSIVE METABOLIC PANEL - Abnormal; Notable for the following components:      Result Value   Sodium 132 (*)    Potassium 3.0 (*)    Chloride 93 (*)    Glucose, Bld 166 (*)    Creatinine, Ser 1.55 (*)    GFR, Estimated 33 (*)     All other components within normal limits  CBC WITH DIFFERENTIAL/PLATELET  D-DIMER, QUANTITATIVE  URINALYSIS, ROUTINE W REFLEX MICROSCOPIC  TROPONIN I (HIGH SENSITIVITY)  TROPONIN I (HIGH SENSITIVITY)    EKG EKG Interpretation  Date/Time:  Saturday August 29 2021 10:21:35 EDT Ventricular Rate:  78 PR Interval:  178 QRS Duration: 102 QT Interval:  397 QTC Calculation: 453 R  Axis:   49 Text Interpretation: Sinus rhythm Confirmed by Milton Ferguson [phone]) on 08/29/2021 2:22:59 PM  Radiology DG Chest Port 1 View  Result Date: 08/29/2021 CLINICAL DATA:  81 year old female with history of shortness of breath. EXAM: PORTABLE CHEST 1 VIEW COMPARISON:  Chest x-Langbehn 08/16/2021. FINDINGS: Lung volumes are normal. No consolidative airspace disease. No pleural effusions. No pneumothorax. No pulmonary nodule or mass noted. Pulmonary vasculature and the cardiomediastinal silhouette are within normal limits. Atherosclerosis in the thoracic aorta. IMPRESSION: 1.  No radiographic evidence of acute cardiopulmonary disease. 2. Aortic atherosclerosis. Electronically Signed   By: Vinnie Langton M.D.   On: 08/29/2021 11:09    Procedures Procedures    Medications Ordered in ED Medications  potassium chloride SA (KLOR-CON M) CR tablet 40 mEq (40 mEq Oral Given 08/29/21 1201)    ED Course/ Medical Decision Making/ A&P Patient's chest discomfort has improved.  Her daughter was concerned about UTI so we are going to get a urine.                           Medical Decision Making Amount and/or Complexity of Data Reviewed Labs: ordered. Radiology: ordered. ECG/medicine tests: ordered.  Risk Prescription drug management.  This patient presents to the ED for concern of chest pain, this involves an extensive number of treatment options, and is a complaint that carries with it a high risk of complications and morbidity.  The differential diagnosis includes coronary artery disease, PE, chest wall   Co  morbidities that complicate the patient evaluation  Hypertension diabetes   Additional history obtained:  Additional history obtained from patient External records from outside source obtained and reviewed including hospital record   Lab Tests:  I Ordered, and personally interpreted labs.  The pertinent results include: Troponin x2 which was negative.  Potassium slightly low at 3.0 urinalysis negative.  And D-dimer normal   Imaging Studies ordered:  I ordered imaging studies including chest x-[place] I independently visualized and interpreted imaging which showed acute disease I agree with the radiologist interpretation   Cardiac Monitoring: / EKG:  The patient was maintained on a cardiac monitor.  I personally viewed and interpreted the cardiac monitored which showed an underlying rhythm of: Normal sinus rhythm   Consultations Obtained:  No consult  Problem List / ED Course / Critical interventions / Medication management  Chest disc I ordered medication including potassium for hypokalemia Reevaluation of the patient after these medicines showed that the patient stayed the same I have reviewed the patients home medicines and have made adjustments as needed   Social Determinants of Health:  Lives with daughter   Test / Admission - Considered:  Patient does not meet admission criteria no other test considered  Patient with atypical chest discomfort.  She will be discharged home to follow-up with her primary care doctor.  Suspect chest wall discomfort.  Do not suspect coronary artery disease causing the pain        Final Clinical Impression(s) / ED Diagnoses Final diagnoses:  None    Rx / DC Orders ED Discharge Orders     None         Milton Ferguson, MD 08/30/21 [phone]

## 2021-08-29 NOTE — ED Notes (Signed)
Pure wick placed.

## 2021-08-29 NOTE — ED Provider Notes (Signed)
Patient signed to me by Dr. Roderic Palau pending results of urinalysis which is negative.  She has no chest pain at this time.  Will discharge home   Lacretia Leigh, MD 08/29/21 [phone]

## 2021-08-29 NOTE — ED Triage Notes (Signed)
Patient coming from home with c/o chest pain and shortness of breath. Patient given 324 mg aspirin and Ns 250 ml per ems.

## 2021-08-29 NOTE — Discharge Instructions (Addendum)
Follow-up with your doctor next week for repeat potassium

## 2021-08-31 DIAGNOSIS — E11610 Type 2 diabetes mellitus with diabetic neuropathic arthropathy: Secondary | ICD-10-CM | POA: Diagnosis not present

## 2021-08-31 DIAGNOSIS — Z79899 Other long term (current) drug therapy: Secondary | ICD-10-CM | POA: Diagnosis not present

## 2021-08-31 DIAGNOSIS — E1122 Type 2 diabetes mellitus with diabetic chronic kidney disease: Secondary | ICD-10-CM | POA: Diagnosis not present

## 2021-08-31 DIAGNOSIS — F0390 Unspecified dementia without behavioral disturbance: Secondary | ICD-10-CM | POA: Diagnosis not present

## 2021-08-31 DIAGNOSIS — I700 Atherosclerosis of aorta: Secondary | ICD-10-CM | POA: Diagnosis not present

## 2021-08-31 DIAGNOSIS — I130 Hypertensive heart and chronic kidney disease with heart failure and stage 1 through stage 4 chronic kidney disease, or unspecified chronic kidney disease: Secondary | ICD-10-CM | POA: Diagnosis not present

## 2021-08-31 DIAGNOSIS — G9341 Metabolic encephalopathy: Secondary | ICD-10-CM | POA: Diagnosis not present

## 2021-08-31 DIAGNOSIS — F419 Anxiety disorder, unspecified: Secondary | ICD-10-CM | POA: Diagnosis not present

## 2021-08-31 DIAGNOSIS — G4700 Insomnia, unspecified: Secondary | ICD-10-CM | POA: Diagnosis not present

## 2021-08-31 DIAGNOSIS — G2581 Restless legs syndrome: Secondary | ICD-10-CM | POA: Diagnosis not present

## 2021-08-31 DIAGNOSIS — E559 Vitamin D deficiency, unspecified: Secondary | ICD-10-CM | POA: Diagnosis not present

## 2021-08-31 DIAGNOSIS — Z7189 Other specified counseling: Secondary | ICD-10-CM | POA: Diagnosis not present

## 2021-08-31 LAB — URINE CULTURE: Culture: NO GROWTH

## 2021-09-02 DIAGNOSIS — N390 Urinary tract infection, site not specified: Secondary | ICD-10-CM | POA: Diagnosis not present

## 2021-09-02 DIAGNOSIS — E876 Hypokalemia: Secondary | ICD-10-CM | POA: Diagnosis not present

## 2021-09-02 DIAGNOSIS — E860 Dehydration: Secondary | ICD-10-CM | POA: Diagnosis not present

## 2021-09-02 DIAGNOSIS — B952 Enterococcus as the cause of diseases classified elsewhere: Secondary | ICD-10-CM | POA: Diagnosis not present

## 2021-09-02 DIAGNOSIS — G9341 Metabolic encephalopathy: Secondary | ICD-10-CM | POA: Diagnosis not present

## 2021-09-02 DIAGNOSIS — E871 Hypo-osmolality and hyponatremia: Secondary | ICD-10-CM | POA: Diagnosis not present

## 2021-09-03 DIAGNOSIS — N390 Urinary tract infection, site not specified: Secondary | ICD-10-CM | POA: Diagnosis not present

## 2021-09-03 DIAGNOSIS — E871 Hypo-osmolality and hyponatremia: Secondary | ICD-10-CM | POA: Diagnosis not present

## 2021-09-03 DIAGNOSIS — E860 Dehydration: Secondary | ICD-10-CM | POA: Diagnosis not present

## 2021-09-03 DIAGNOSIS — E876 Hypokalemia: Secondary | ICD-10-CM | POA: Diagnosis not present

## 2021-09-03 DIAGNOSIS — B952 Enterococcus as the cause of diseases classified elsewhere: Secondary | ICD-10-CM | POA: Diagnosis not present

## 2021-09-03 DIAGNOSIS — G9341 Metabolic encephalopathy: Secondary | ICD-10-CM | POA: Diagnosis not present

## 2021-09-09 DIAGNOSIS — E871 Hypo-osmolality and hyponatremia: Secondary | ICD-10-CM | POA: Diagnosis not present

## 2021-09-09 DIAGNOSIS — B952 Enterococcus as the cause of diseases classified elsewhere: Secondary | ICD-10-CM | POA: Diagnosis not present

## 2021-09-09 DIAGNOSIS — E860 Dehydration: Secondary | ICD-10-CM | POA: Diagnosis not present

## 2021-09-09 DIAGNOSIS — N390 Urinary tract infection, site not specified: Secondary | ICD-10-CM | POA: Diagnosis not present

## 2021-09-09 DIAGNOSIS — E876 Hypokalemia: Secondary | ICD-10-CM | POA: Diagnosis not present

## 2021-09-09 DIAGNOSIS — G9341 Metabolic encephalopathy: Secondary | ICD-10-CM | POA: Diagnosis not present

## 2021-09-16 DIAGNOSIS — G9341 Metabolic encephalopathy: Secondary | ICD-10-CM | POA: Diagnosis not present

## 2021-09-16 DIAGNOSIS — E860 Dehydration: Secondary | ICD-10-CM | POA: Diagnosis not present

## 2021-09-16 DIAGNOSIS — N390 Urinary tract infection, site not specified: Secondary | ICD-10-CM | POA: Diagnosis not present

## 2021-09-16 DIAGNOSIS — E876 Hypokalemia: Secondary | ICD-10-CM | POA: Diagnosis not present

## 2021-09-16 DIAGNOSIS — B952 Enterococcus as the cause of diseases classified elsewhere: Secondary | ICD-10-CM | POA: Diagnosis not present

## 2021-09-16 DIAGNOSIS — E871 Hypo-osmolality and hyponatremia: Secondary | ICD-10-CM | POA: Diagnosis not present

## 2021-09-21 DIAGNOSIS — E1142 Type 2 diabetes mellitus with diabetic polyneuropathy: Secondary | ICD-10-CM | POA: Diagnosis not present

## 2021-09-21 DIAGNOSIS — Z9181 History of falling: Secondary | ICD-10-CM | POA: Diagnosis not present

## 2021-09-21 DIAGNOSIS — I5032 Chronic diastolic (congestive) heart failure: Secondary | ICD-10-CM | POA: Diagnosis not present

## 2021-09-21 DIAGNOSIS — N1832 Chronic kidney disease, stage 3b: Secondary | ICD-10-CM | POA: Diagnosis not present

## 2021-09-21 DIAGNOSIS — E663 Overweight: Secondary | ICD-10-CM | POA: Diagnosis not present

## 2021-09-21 DIAGNOSIS — Z79899 Other long term (current) drug therapy: Secondary | ICD-10-CM | POA: Diagnosis not present

## 2021-09-21 DIAGNOSIS — E11621 Type 2 diabetes mellitus with foot ulcer: Secondary | ICD-10-CM | POA: Diagnosis not present

## 2021-09-21 DIAGNOSIS — Z6824 Body mass index (BMI) 24.0-24.9, adult: Secondary | ICD-10-CM | POA: Diagnosis not present

## 2021-09-21 DIAGNOSIS — Z794 Long term (current) use of insulin: Secondary | ICD-10-CM | POA: Diagnosis not present

## 2021-09-21 DIAGNOSIS — I130 Hypertensive heart and chronic kidney disease with heart failure and stage 1 through stage 4 chronic kidney disease, or unspecified chronic kidney disease: Secondary | ICD-10-CM | POA: Diagnosis not present

## 2021-09-23 DIAGNOSIS — B952 Enterococcus as the cause of diseases classified elsewhere: Secondary | ICD-10-CM | POA: Diagnosis not present

## 2021-09-23 DIAGNOSIS — G9341 Metabolic encephalopathy: Secondary | ICD-10-CM | POA: Diagnosis not present

## 2021-09-23 DIAGNOSIS — E871 Hypo-osmolality and hyponatremia: Secondary | ICD-10-CM | POA: Diagnosis not present

## 2021-09-23 DIAGNOSIS — E876 Hypokalemia: Secondary | ICD-10-CM | POA: Diagnosis not present

## 2021-09-23 DIAGNOSIS — N390 Urinary tract infection, site not specified: Secondary | ICD-10-CM | POA: Diagnosis not present

## 2021-09-23 DIAGNOSIS — E860 Dehydration: Secondary | ICD-10-CM | POA: Diagnosis not present

## 2021-09-25 DIAGNOSIS — E876 Hypokalemia: Secondary | ICD-10-CM | POA: Diagnosis not present

## 2021-09-25 DIAGNOSIS — B952 Enterococcus as the cause of diseases classified elsewhere: Secondary | ICD-10-CM | POA: Diagnosis not present

## 2021-09-25 DIAGNOSIS — I2510 Atherosclerotic heart disease of native coronary artery without angina pectoris: Secondary | ICD-10-CM | POA: Diagnosis not present

## 2021-09-25 DIAGNOSIS — G43909 Migraine, unspecified, not intractable, without status migrainosus: Secondary | ICD-10-CM | POA: Diagnosis not present

## 2021-09-25 DIAGNOSIS — G2581 Restless legs syndrome: Secondary | ICD-10-CM | POA: Diagnosis not present

## 2021-09-25 DIAGNOSIS — Z7901 Long term (current) use of anticoagulants: Secondary | ICD-10-CM | POA: Diagnosis not present

## 2021-09-25 DIAGNOSIS — E860 Dehydration: Secondary | ICD-10-CM | POA: Diagnosis not present

## 2021-09-25 DIAGNOSIS — G9341 Metabolic encephalopathy: Secondary | ICD-10-CM | POA: Diagnosis not present

## 2021-09-25 DIAGNOSIS — B0223 Postherpetic polyneuropathy: Secondary | ICD-10-CM | POA: Diagnosis not present

## 2021-09-25 DIAGNOSIS — Z7902 Long term (current) use of antithrombotics/antiplatelets: Secondary | ICD-10-CM | POA: Diagnosis not present

## 2021-09-25 DIAGNOSIS — E11610 Type 2 diabetes mellitus with diabetic neuropathic arthropathy: Secondary | ICD-10-CM | POA: Diagnosis not present

## 2021-09-25 DIAGNOSIS — E1142 Type 2 diabetes mellitus with diabetic polyneuropathy: Secondary | ICD-10-CM | POA: Diagnosis not present

## 2021-09-25 DIAGNOSIS — E1122 Type 2 diabetes mellitus with diabetic chronic kidney disease: Secondary | ICD-10-CM | POA: Diagnosis not present

## 2021-09-25 DIAGNOSIS — N179 Acute kidney failure, unspecified: Secondary | ICD-10-CM | POA: Diagnosis not present

## 2021-09-25 DIAGNOSIS — D631 Anemia in chronic kidney disease: Secondary | ICD-10-CM | POA: Diagnosis not present

## 2021-09-25 DIAGNOSIS — E871 Hypo-osmolality and hyponatremia: Secondary | ICD-10-CM | POA: Diagnosis not present

## 2021-09-25 DIAGNOSIS — M1990 Unspecified osteoarthritis, unspecified site: Secondary | ICD-10-CM | POA: Diagnosis not present

## 2021-09-25 DIAGNOSIS — F03A4 Unspecified dementia, mild, with anxiety: Secondary | ICD-10-CM | POA: Diagnosis not present

## 2021-09-25 DIAGNOSIS — I130 Hypertensive heart and chronic kidney disease with heart failure and stage 1 through stage 4 chronic kidney disease, or unspecified chronic kidney disease: Secondary | ICD-10-CM | POA: Diagnosis not present

## 2021-09-25 DIAGNOSIS — K219 Gastro-esophageal reflux disease without esophagitis: Secondary | ICD-10-CM | POA: Diagnosis not present

## 2021-09-25 DIAGNOSIS — I5032 Chronic diastolic (congestive) heart failure: Secondary | ICD-10-CM | POA: Diagnosis not present

## 2021-09-25 DIAGNOSIS — M1A0720 Idiopathic chronic gout, left ankle and foot, without tophus (tophi): Secondary | ICD-10-CM | POA: Diagnosis not present

## 2021-09-25 DIAGNOSIS — E785 Hyperlipidemia, unspecified: Secondary | ICD-10-CM | POA: Diagnosis not present

## 2021-09-25 DIAGNOSIS — N184 Chronic kidney disease, stage 4 (severe): Secondary | ICD-10-CM | POA: Diagnosis not present

## 2021-09-25 DIAGNOSIS — N390 Urinary tract infection, site not specified: Secondary | ICD-10-CM | POA: Diagnosis not present

## 2021-09-28 DIAGNOSIS — B952 Enterococcus as the cause of diseases classified elsewhere: Secondary | ICD-10-CM | POA: Diagnosis not present

## 2021-09-28 DIAGNOSIS — G9341 Metabolic encephalopathy: Secondary | ICD-10-CM | POA: Diagnosis not present

## 2021-09-28 DIAGNOSIS — E871 Hypo-osmolality and hyponatremia: Secondary | ICD-10-CM | POA: Diagnosis not present

## 2021-09-28 DIAGNOSIS — E876 Hypokalemia: Secondary | ICD-10-CM | POA: Diagnosis not present

## 2021-09-28 DIAGNOSIS — E860 Dehydration: Secondary | ICD-10-CM | POA: Diagnosis not present

## 2021-09-28 DIAGNOSIS — N390 Urinary tract infection, site not specified: Secondary | ICD-10-CM | POA: Diagnosis not present

## 2021-10-06 ENCOUNTER — Other Ambulatory Visit: Payer: Self-pay

## 2021-10-06 ENCOUNTER — Ambulatory Visit (INDEPENDENT_AMBULATORY_CARE_PROVIDER_SITE_OTHER): Payer: Medicare Other | Admitting: Neurology

## 2021-10-06 ENCOUNTER — Telehealth: Payer: Self-pay | Admitting: Hematology

## 2021-10-06 ENCOUNTER — Encounter: Payer: Self-pay | Admitting: Neurology

## 2021-10-06 VITALS — BP 127/81 | HR 88 | Ht 66.0 in | Wt 174.6 lb

## 2021-10-06 DIAGNOSIS — G301 Alzheimer's disease with late onset: Secondary | ICD-10-CM

## 2021-10-06 DIAGNOSIS — F02A18 Dementia in other diseases classified elsewhere, mild, with other behavioral disturbance: Secondary | ICD-10-CM

## 2021-10-06 DIAGNOSIS — R413 Other amnesia: Secondary | ICD-10-CM | POA: Diagnosis not present

## 2021-10-06 MED ORDER — MEMANTINE HCL 28 X 5 MG & 21 X 10 MG PO TABS: ORAL_TABLET | ORAL | 12 refills | Status: DC

## 2021-10-06 MED ORDER — MEMANTINE HCL 10 MG PO TABS: 10.0000 mg | ORAL_TABLET | Freq: Two times a day (BID) | ORAL | 3 refills | Status: AC

## 2021-10-06 NOTE — Patient Instructions (Signed)
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  Recommend further evaluation with checking memory panel labs, EEG and treatment trial of Namenda starter pack and if tolerated without side effects 10 mg twice daily later.  She was also encouraged to increase participation in cognitively challenging activities like solving crossword puzzles, playing bridge and sudoku.  She can discontinue Cerefolin after tolerating Namenda first.  She will return for follow-up in the future in 3 months or call earlier if necessary.

## 2021-10-06 NOTE — Progress Notes (Signed)
Patient requested memantine titration pak to be sent to [facility] on [address] after her visit as to not delay care. Rx has been resent to pharmacy.

## 2021-10-06 NOTE — Progress Notes (Signed)
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 Sebastiani, 81 year old female returns for follow-up with postherpetic neuralgia pain controlled with gabapentin 3 tablets daily.  She also has history of mild cognitive impairment and new complaint today of daytime drowsiness.  She had recent surgery on her ankle.  In November by Dr.Duda. Right subtalar and Talonavicular fusion.  She continues to be nonweightbearing and has a boot on the right.  She has had ER admission for hypokalemia and precordial chest pain since last seen.  She has a history of congestive heart failure.  Blood pressure in the office today 131/80.  She has recently had some changes to her insulin with better control.  Most recent hemoglobin A1c 7.1 on 02/22/2018.  Her mild cognitive impairment is stable.  She has 10 grade education.  She takes Cerefolin.  ADLs 6, IADLs 5 out of 8.  ESS score 12.  She returns for reevaluation  Update 01/31/2019: Ms. Wayment is a 81 year old female who is being seen today for follow-up regarding postherpetic neuralgia s/p shingles, RLS and cognitive impairment accompanied by her husband.  Pain mostly well managed with ongoing use of gabapentin 300 mg 3 times daily.  She does have intermittent pain right scapula that will flareup at times.  History of mild cognitive impairment which has been stable with ongoing use of Cerefolin.  MMSE 24.  recommended at prior visit to undergo sleep study for potential sleep apnea but initial evaluation postponed with Dr. Rexene Alberts due to COVID-19 restrictions and rescheduled evaluations canceled by patient.  She does not feel as though she needs to pursue sleep study at this time as she has previously had sleep study done "many  years ago" which did not show sleep apnea.  She believes daytime fatigue is more due to polypharmacy and chronic medical conditions.  She continues on Mirapex for RLS with benefit.  She has been having increased difficulty with ambulation and balance with chronic bilateral feet issues with prior procedures.  She continues to follow with orthopedics.  She continues to follow with endocrinology for diabetes with patient reporting recent A1c >7.  Currently making adjustments with insulin regimen and adjusting diet.  No further concerns at this time.  Update 10/06/2021 : She returns for follow-up after last visit 2 and half years ago.  She is accompanied by husband.  Patient has noticed increasing memory difficulties for the past few years.  Her recall is very poor.  She has occasional word finding difficulties but does not have to stop midsentence.  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  She gets disoriented to persons at times and even has trouble recognizing her husband.  She is also having some hallucinations and occasionally sees dead family members and sometimes children in the yard.  She has been on Cerefolin for mild cognitive impairment for quite some time and is still taking it every day.  She is currently getting some home physical and Occupational Therapy.  Patient admits that her memory and cognition have gotten worse and she wants to try medications like Namenda.  She has not had any recent  lab work but she had an EEG on 08/19/2021 which was normal and MRI scan on 08/18/2021 had shown no acute abnormality and only changes of small vessel disease.  At last office visit Mini-Mental score was 24/30 it has clearly declined to 18/30 in today's visit.  Her symptoms of restless leg syndrome and postherpetic neuralgia appear to be stable and not  bothersome.    REVIEW OF SYSTEMS: Full 14 system review of systems performed and notable only for those listed above in history of presenting illness and otherwise all systems negative,    ALLERGIES: Allergies  Allergen Reactions   Nsaids Nausea And Vomiting and Palpitations   Reglan [Metoclopramide] Other (See Comments)    Seizures  Tardive dyskinesia Paradoxical reaction not relieved by benadryl   Ultram [Tramadol] Other (See Comments)    Pt has seizure activity with this medication   Lipitor [Atorvastatin] Swelling and Other (See Comments)    Myalgias     Lyrica [Pregabalin] Swelling and Other (See Comments)    Weight gain    Urecholine [Bethanechol] Nausea Only and Palpitations    HOME MEDICATIONS: Outpatient Medications Prior to Visit  Medication Sig Dispense Refill   acetaminophen-codeine (TYLENOL #3) 300-30 MG tablet Take 1 tablet by mouth every 4 (four) hours as needed for moderate pain.     ALPRAZolam (XANAX) 0.25 MG tablet Take 1-2 tablets (0.25-0.5 mg total) by mouth 2 (two) times daily as needed for anxiety. 20 tablet 0   amitriptyline (ELAVIL) 25 MG tablet Take 1 tablet (25 mg total) by mouth at bedtime. 30 tablet 2   Biotin 10000 MCG TABS Take 1 tablet by mouth every evening.     Blood Glucose Monitoring Suppl (FREESTYLE LITE) w/Device KIT use as directed 1 kit 0   bumetanide (BUMEX) 2 MG tablet Take 4 mg by mouth daily.     Cholecalciferol (VITAMIN D3) 50 MCG (2000 UT) capsule Take 2,000 Units by mouth every evening.     Coenzyme Q10 (COQ10 PO) Take 1 capsule by mouth every evening.     COLCRYS 0.6 MG tablet TAKE 1 TABLET DAILY AS NEEDED FOR GOUT FLARE. 90 tablet 3   CRANBERRY PO Take 4,200 mg by mouth every evening.     ELIQUIS 5 MG TABS tablet Take 5 mg by mouth 2 (two) times daily.     febuxostat (ULORIC) 40 MG tablet Take 40 mg by mouth daily.     gabapentin (NEURONTIN) 300 MG capsule Take 300 mg by mouth at bedtime.     glipiZIDE (GLUCOTROL XL) 2.5 MG 24  hr tablet Take 2.5 mg by mouth daily with breakfast.     glucose blood (FREESTYLE LITE) test strip Test blood sugars up to 4 times daily 100 each 3   Insulin Pen Needle (PENTIPS) 32G X 4 MM MISC Use as directed 100 each 5   Insulin Pen Needle 32G X 4 MM MISC 5 Units by Does not apply route in the morning and at bedtime. 100 each 0   insulin regular (NOVOLIN R) 100 units/mL injection Inject into the skin 3 (three) times daily before meals. Started last week 4/28: Sliding scale before meals: <150 none,  151-199 - 2 units                                                                            200-249 - 4 units                                                                            250-299 - 6 units                                                                            300-349 - 8 units                                                                            350> 10 units     JANUVIA 25 MG tablet Take 25 mg by mouth daily.     Krill Oil 500 MG CAPS Take 500 mg by mouth daily.     Lancets (FREESTYLE) lancets Test blood sugars up to 4 times daily 100 each 3   Methylfol-Algae-B12-Acetylcyst (CEREFOLIN NAC PO) Take 1 tablet by mouth in the morning.     metolazone (ZAROXOLYN) 2.5 MG tablet Take 2.5 mg by mouth daily.     metoprolol succinate (TOPROL-XL) 25 MG 24 hr tablet Take 25 mg by mouth at bedtime.      Multiple Vitamin (MULTIVITAMIN WITH MINERALS) TABS tablet Take 1 tablet by mouth daily.     nitroGLYCERIN (NITROLINGUAL) 0.4 MG/SPRAY spray Place 1 spray under the tongue every 5 (five) minutes x 3 doses as needed for chest pain.     pantoprazole (PROTONIX) 40 MG tablet Take 1 tablet (40 mg total) by mouth 2 (two) times daily before a meal.     potassium chloride SA (KLOR-CON M) 20 MEQ tablet Take 20 mEq by mouth daily.     pramipexole (MIRAPEX) 0.25 MG tablet TAKE 1 TO 2 TABLETS AS INSTRUCTED, 1 TABLET AT 4:00 P.M.  AND 2 TABLETS AT BEDTIME 270 tablet 1   rosuvastatin (CRESTOR) 20 MG tablet Take 20 mg by mouth daily.     UNABLE TO FIND Take 1 tablet by mouth daily. Med Name: Resveratrol 526m     insulin glargine (LANTUS) 100 UNIT/ML Solostar Pen Inject 5 Units into the skin daily. (Patient not taking: Reported on 10/06/2021) 3 mL 0   exemestane (AROMASIN) 25 MG tablet Take 1 tablet (25 mg  total) by mouth daily after breakfast. TAKE 1 TABLET DAILY AFTER BREAKFAST (Patient taking differently: Take 25 mg by mouth daily after breakfast.) 90 tablet 3   glipiZIDE (GLUCOTROL) 5 MG tablet Take 5 mg by mouth daily before breakfast.     potassium chloride (KLOR-CON) 10 MEQ tablet Take 1 tablet (10 mEq total) by mouth daily. 7 tablet 0   No facility-administered medications prior to visit.    PAST MEDICAL HISTORY: Past Medical History:  Diagnosis Date   Acute blood loss anemia 02/19/2020   Acute renal failure superimposed on stage 4 chronic kidney disease ([place]) 02/21/2020   Acute respiratory failure with hypoxia (HCC) 02/21/2020   Anemia    have received iron infusions   Anxiety    Arthritis     osteoarthritis   Breast cancer ([place])    Breast cancer in female [facility])    Bilateral   CAD S/P percutaneous coronary angioplasty 2007; March 2011   [facility] 3.0 mm 20 mm postdilated 3.6 mm in early mid LAD; status post ISR Cutting Balloon PTCA and March 11 along with PCI of distal mid lesion with a 3.0 mm 12 mm MultiLink vision BMS; the proximal stent causes jailing of SP1 and SP2 with ostial 70-80% lesions   Cancer ([place])    Melanoma, Squamous cell Carcinoma   Charcot's joint of foot, right    Charcot's joint of left foot    Chronic diastolic CHF (congestive heart failure), NYHA class 2 ([place]) 02/28/2018   Chronic kidney disease    stage 2    Closed fracture of neck of left femur ([place]) 02/08/2020   Coronary artery disease    Dementia ([place])    mild   Diabetes mellitus type 2 with neurological manifestations  ([place])    Diabetes mellitus without complication (HCC)    Dyslipidemia, goal LDL below 70    Foot pain, bilateral    Full dentures    Genetic testing 12/03/2016   Germline genetic testing was performed through [employer]'s Common Hereditary Cancers Panel + [employer]'s Melanoma Panel. This custom panel includes analysis of the following 51 genes: APC, ATM, AXIN2, BAP1, BARD1, BMPR1A, BRCA1, BRCA2, BRIP1, CDH1, CDK4, CDKN2A, CHEK2, CTNNA1, DICER1, EPCAM, GREM1, HOXB13, KIT, MEN1, MITF, MLH1, MSH2, MSH3, MSH6, MUTYH, NBN, NF1, NTHL1, PALB2, PDGFRA, PMS2, POLD1, POL   GERD (gastroesophageal reflux disease)    GIB (gastrointestinal bleeding)    Gout    Headache(784.0)    Hip fracture ([place]) 02/08/2020   History of hematuria    Followed by Dr. Gaynelle Arabian   Hypertension, essential, benign    Hypotension    Migraine    Mild cognitive impairment    Peripheral neuropathy    Personal history of radiation therapy    Post herpetic neuralgia    Restless leg syndrome    Stroke ([place])    TIA (transient ischemic attack)    multiple in the past   TIA (transient ischemic attack)    Wears glasses     PAST SURGICAL HISTORY: Past Surgical History:  Procedure Laterality Date   ABDOMINAL HYSTERECTOMY     ANKLE FUSION Right 02/22/2018   Procedure: RIGHT SUBTALAR AND TALONAVICULAR FUSION;  Surgeon: Newt Minion, MD;  Location: [facility];  Service: Orthopedics;  Laterality: Right;   APPENDECTOMY     BIOPSY  02/19/2020   Procedure: BIOPSY;  Surgeon: Otis Brace, MD;  Location: [place];  Service: Gastroenterology;;   BLADDER SUSPENSION     BREAST LUMPECTOMY Bilateral 2018   BREAST  LUMPECTOMY WITH RADIOACTIVE SEED AND SENTINEL LYMPH NODE BIOPSY Bilateral 12/14/2016   Procedure: BILATERAL BREAST LUMPECTOMY WITH BILATERAL  RADIOACTIVE SEED AND BILATERAL AXILLARY  SENTINEL LYMPH NODE BIOPSY ERAS PATHWAY;  Surgeon: Alphonsa Overall, MD;  Location: [facility];  Service: General;  Laterality: Bilateral;  [place]  02/24/2006   85% stenosis in the proximal portion of LAD-arrangements made for PCI on 02/25/2006   CARDIAC CATHETERIZATION  02/25/2006   80% LAD lesion stented with a 3x56m Liberte stent resulting in reduction of 80% lesion to 0% residual   CARDIAC CATHETERIZATION  04/26/2006   Medical management   CARDIAC CATHETERIZATION  06/24/2009   60-70% re-stenosis in the proximal LAD. A 3.25x15 cutting balloon, 3 inflations - 14atm-38sec, 13atm-39sec, and 12atm-40sec reduced to less than 10%. 60% stenosis of the mid/distal LAD stented with a 3x131mMultilink stent.   CARDIAC CATHETERIZATION  07/09/2009   Medical management   CARDIAC CATHETERIZATION     CAROTID DOPPLER  06/24/2009   40-59% R ICA stenosis. No significant ICA stenosis noted   CHOLECYSTECTOMY     CORONARY ANGIOPLASTY     DILATION AND CURETTAGE OF UTERUS     ESOPHAGOGASTRODUODENOSCOPY N/A 02/26/2020   Procedure: ESOPHAGOGASTRODUODENOSCOPY (EGD);  Surgeon: MaClarene EssexMD;  [facility] Service: Endoscopy;  Laterality: N/A;   ESOPHAGOGASTRODUODENOSCOPY (EGD) WITH PROPOFOL N/A 02/19/2020   Procedure: ESOPHAGOGASTRODUODENOSCOPY (EGD) WITH PROPOFOL;  Surgeon: BrOtis BraceMD;  [facility] Service: Gastroenterology;  Laterality: N/A;   ESOPHAGOGASTRODUODENOSCOPY (EGD) WITH PROPOFOL Left 02/23/2020   Procedure: ESOPHAGOGASTRODUODENOSCOPY (EGD) WITH PROPOFOL;  Surgeon: OuArta SilenceMD;  [facility] Service: Endoscopy;  Laterality: Left;   HIP ARTHROPLASTY Left 02/10/2020   Procedure: ARTHROPLASTY BIPOLAR HIP (HEMIARTHROPLASTY) POSTERIOR LATERAL;  Surgeon: MuRenette ButtersMD;  [place] [facility] Service: Orthopedics;  Laterality: Left;   JOINT REPLACEMENT Left    LESION REMOVAL Right 03/11/2015   Procedure:  EXCISION OF RIGHT PRE TIBIAL LESION;  Surgeon: JaJudeth HornMD;  [place] [facility] Service: General;  Laterality: Right;   MASS EXCISION Left  06/10/2014   Procedure: EXCISION LEFT LOWER LEG LESION;  Surgeon: JaDoreen SalvageMD;  [place] Service: General;  Laterality: Left;   MASS EXCISION Left 09/05/2015   Procedure: EXCISION OF LEFT FOREARM  MASS;  Surgeon: JaJudeth HornMD;  Location: [facility] Service: General;  Laterality: Left;   NM MYOVIEW LTD  10/2011   No ischemia or infarction.  Normal EF.  LOW RISK. (Short bursts of 5 beats NSVT during recovery otherwise normal)   POLYPECTOMY  02/19/2020   Procedure: POLYPECTOMY;  Surgeon: BrOtis BraceMD;  [facility];  Service: Gastroenterology;;   SHOULDER ARTHROSCOPY  10/15   SHOULDER ARTHROSCOPY  1995   left   TEE WITHOUT CARDIOVERSION N/A 08/03/2017   Procedure: TRANSESOPHAGEAL ECHOCARDIOGRAM (TEE);  Surgeon: RaSkeet LatchMD;  [facility] Service: Cardiovascular;; EF 60-65% with no wall motion normalities.  No atrial thrombus noted.  Lightly findings consistent with a lipomatous hypertrophy of the atrial septum.    TENDON REPAIR Left 05/12/2016   Procedure: Left Anterior Tibial Tendon Reconstruction;  Surgeon: MaNewt MinionMD;  [facility] Service: Orthopedics;  Laterality: Left;   TOTAL KNEE ARTHROPLASTY  2005   left   TRANSESOPHAGEAL ECHOCARDIOGRAM  08/03/2016   : EF 60-65% no wall motion normalities.  No atrial thrombus.  Lipomatous hypertrophy of  the atrial septum.  No mass noted.   TRANSESOPHAGEAL ECHOCARDIOGRAM  07/2017   EF 60 to 65%.  No R WMA.  No atrial thrombus noted.  Lipomatous IAS.   TRANSTHORACIC ECHOCARDIOGRAM  02/25/2017   :EF 65-70%.  GR 1 DD.  No or WMA.  Moderate aortic calcification/sclerosis but no stenosis.   TRANSTHORACIC ECHOCARDIOGRAM  07/2017   Vigorous LV function.  EF 65-70%.  Mild diastolic dysfunction with no RWMA. GR 1 DD.  Mild aortic sclerosis/no stenosis.  Questionable right atrial mass discounted with TEE)    WRIST ARTHROPLASTY  2010   cancer lt wrist    FAMILY  HISTORY: Family History  Problem Relation Age of Onset   Diabetes Mother    Kidney disease Mother    Diabetes Sister    Diabetes Brother    Hypertension Mother    Hypertension Father    Emphysema Father    Heart attack Father    Heart disease Father    Arthritis Sister    Diabetes Sister    Hypertension Sister    Breast cancer Sister 75       treated with mastectomy   Cervical cancer Sister 46   Hypertension Brother    Hyperlipidemia Brother    Diabetes Brother    Stroke Brother    Diabetes Sister    Hypertension Sister    Stomach cancer Sister 62   Hypertension Brother    ALS Brother        d.62s   Breast cancer Daughter 6       triple negative treated with lumpectomy, chemo, radiation   Heart attack Daughter 35       d.45   COPD Daughter    Cancer Paternal Aunt        unspecified type   Cancer Paternal Uncle        unspecified type   Cancer Paternal Uncle        unspecified type    SOCIAL HISTORY: Social History   Socioeconomic History   Marital status: Married    Spouse name: [name]   Number of children: 3   Years of education: 11   Highest education level: Not on file  Occupational History   Occupation: retired    Fish farm manager: RETIRED  Tobacco Use   Smoking status: Never   Smokeless tobacco: Never  Vaping Use   Vaping Use: Never used  Substance and Sexual Activity   Alcohol use: Never   Drug use: Never   Sexual activity: Never  Other Topics Concern   Not on file  Social History Narrative   ** Merged History Encounter **       She is a married mother of 103, grandmother of 2.  Usually very active and out about the house.  But currently over last 6-8 weeks has been incapacitated.  She never smoked and does not drink alcohol. 10 th grade   Social Determinants of Health   Financial Resource Strain: Not on file  Food Insecurity: No Food Insecurity (04/16/2020)   Hunger Vital Sign    Worried About Running Out of Food in the Last Year: Never true     Ran Out of Food in the Last Year: Never true  Transportation Needs: No Transportation Needs (04/16/2020)   PRAPARE - Hydrologist (Medical): No    Lack of Transportation (Non-Medical): No  Physical Activity: Not on file  Stress: Not on file  Social Connections: Not on file  Intimate  Partner Violence: Not on file     PHYSICAL EXAM  Vitals:   10/06/21 1421  BP: 127/81  Pulse: 88  Weight: 174 lb 9.6 oz (79.2 kg)  Height: _0  (1.676 m)   Body mass index is 28.18 kg/m.  Generalized: Pleasant obese elderly Caucasian female in no acute distress  Head: normocephalic and atraumatic Neck: Supple,  Musculoskeletal: No deformity Cardiovascular: 1+ BLE edema  Neurological examination   Mentation: She is awake alert oriented to time place and person.  She has diminished attention, registration and recall.  She follows three-step commands.  She is able to name only 8 animals which can walk on 4 legs.  Mini-Mental status score exam is 18/30.  She is able to copy intersecting pentagons with some difficulty.    10/06/2021    2:35 PM 01/31/2019    7:37 AM 04/06/2018    8:00 AM  MMSE - Mini Mental State Exam  Orientation to time _1 Orientation to Place _2 Registration _3 Attention/ Calculation _4 Recall 0 3 2  Language- name 2 objects _5 Language- repeat _6 Language- follow 3 step command _7 Language- read & follow direction _8 Write a sentence _9 Copy design 0 1 1  Copy design-comments 4-legged animals x one minute: 9    Total score _10 Cranial nerve II-XII: Pupils were equal round reactive to light extraocular movements were full, visual field were full on confrontational test. Facial sensation and strength were normal. hearing was intact to finger rubbing bilaterally. Uvula tongue midline. head turning and shoulder shrug were normal and symmetric.Tongue protrusion into cheek strength was normal. Motor: normal  bulk and tone, full strength in the BUE, BLE  Sensory: Decreased pinprick to mid shin decreased vibratory to toes bilaterally Coordination: finger-nose-finger,  no dysmetria Reflexes: 1+ upper lower and symmetric plantar responses were flexor bilaterally. Gait and Station: Ambulates with broad-based gait and use a cane     DIAGNOSTIC DATA (LABS, IMAGING, TESTING) - I reviewed patient records, labs, notes, testing and imaging myself where available.  Lab Results  Component Value Date   WBC 9.0 08/29/2021   HGB 13.6 08/29/2021   HCT 39.4 08/29/2021   MCV 87.4 08/29/2021   PLT 215 08/29/2021      Component Value Date/Time   NA 132 (L) 08/29/2021 1037   NA 140 08/01/2017 1143   K 3.0 (L) 08/29/2021 1037   CL 93 (L) 08/29/2021 1037   CO2 25 08/29/2021 1037   GLUCOSE 166 (H) 08/29/2021 1037   BUN 20 08/29/2021 1037   BUN 25 08/01/2017 1143   CREATININE 1.55 (H) 08/29/2021 1037   CREATININE 1.61 (H) 12/05/2019 1003   CREATININE 0.94 01/09/2013 0839   CALCIUM 9.4 08/29/2021 1037   PROT 7.4 08/29/2021 1037   PROT 7.1 08/01/2017 1143   ALBUMIN 3.7 08/29/2021 1037   ALBUMIN 4.7 08/01/2017 1143   AST 36 08/29/2021 1037   AST 39 12/05/2019 1003   ALT 34 08/29/2021 1037   ALT 26 12/05/2019 1003   ALKPHOS 71 08/29/2021 1037   BILITOT 0.8 08/29/2021 1037   BILITOT 0.6 12/05/2019 1003   GFRNONAA 33 (L) 08/29/2021 1037   GFRNONAA 30 (L) 12/05/2019 1003   GFRAA 34 (L) 12/05/2019 1003   Lab Results  Component Value Date   CHOL 101 08/19/2021  HDL 30 (L) 08/19/2021   LDLCALC 47 08/19/2021   TRIG 119 08/19/2021   CHOLHDL 3.4 08/19/2021   Lab Results  Component Value Date   HGBA1C 9.3 (H) 08/18/2021   Lab Results  Component Value Date   VITAMINB12 617 09/26/2009   Lab Results  Component Value Date   TSH 1.195 08/16/2021      ASSESSMENT AND PLAN 81 y.o. year old female  with mild cognitive impairment which appears to now have progressed to early dementia,  subjectively fluctuating course, but memory testing in office has shown decline.  History of restless leg syndrome and postherpetic neuralgia which appears stable PLAN:  I had a long discussion with the patient and her husband regarding her progressive memory and cognitive decline is likely represents now transition from mild cognitive impairment to mild dementia.  Recommend further evaluation with checking memory panel labs, EEG and treatment trial of Namenda starter pack and if tolerated without side effects 10 mg twice daily later.  She was also encouraged to increase participation in cognitively challenging activities like solving crossword puzzles, playing bridge and sudoku.  She can discontinue Cerefolin after tolerating Namenda first.  She will return for follow-up in the future in 3 months or call earlier if necessary. Greater than 50% time during this prolonged 40-minute visit was spent on counseling and coordination of care about her mild cognitive impairment which has progressed to dementia and discussion about evaluation and treatment and answering questions. Antony Contras, MD [facility] [address] [place], [place] [address]  Phone [phone] Fax [phone] Note: This document was prepared with digital dictation and possible smart phrase technology. Any transcriptional errors that result from this process are unintentional.

## 2021-10-06 NOTE — Telephone Encounter (Signed)
Left message with rescheduled upcoming appointment due to provider's template. 

## 2021-10-07 DIAGNOSIS — G301 Alzheimer's disease with late onset: Secondary | ICD-10-CM | POA: Diagnosis not present

## 2021-10-07 DIAGNOSIS — F02A18 Dementia in other diseases classified elsewhere, mild, with other behavioral disturbance: Secondary | ICD-10-CM | POA: Diagnosis not present

## 2021-10-07 LAB — DEMENTIA PANEL
Homocysteine: 9.8 umol/L (ref 0.0–21.3)
RPR Ser Ql: NONREACTIVE
TSH: 3.43 u[IU]/mL (ref 0.450–4.500)
Vitamin B-12: 1904 pg/mL — ABNORMAL HIGH (ref 232–1245)

## 2021-10-07 LAB — COMPREHENSIVE METABOLIC PANEL
ALT: 37 IU/L — ABNORMAL HIGH (ref 0–32)
AST: 39 IU/L (ref 0–40)
Albumin/Globulin Ratio: 1.5 (ref 1.2–2.2)
Albumin: 4.2 g/dL (ref 3.7–4.7)
Alkaline Phosphatase: 112 IU/L (ref 44–121)
BUN/Creatinine Ratio: 15 (ref 12–28)
BUN: 18 mg/dL (ref 8–27)
Bilirubin Total: 0.3 mg/dL (ref 0.0–1.2)
CO2: 32 mmol/L — ABNORMAL HIGH (ref 20–29)
Calcium: 10.1 mg/dL (ref 8.7–10.3)
Chloride: 89 mmol/L — ABNORMAL LOW (ref 96–106)
Creatinine, Ser: 1.17 mg/dL — ABNORMAL HIGH (ref 0.57–1.00)
Globulin, Total: 2.8 g/dL (ref 1.5–4.5)
Glucose: 157 mg/dL — ABNORMAL HIGH (ref 70–99)
Potassium: 4 mmol/L (ref 3.5–5.2)
Sodium: 136 mmol/L (ref 134–144)
Total Protein: 7 g/dL (ref 6.0–8.5)
eGFR: 46 mL/min/{1.73_m2} — ABNORMAL LOW (ref >59)

## 2021-10-07 LAB — CBC WITH DIFFERENTIAL
Basophils Absolute: 0.1 10*3/uL (ref 0.0–0.2)
Basos: 1 %
EOS (ABSOLUTE): 0.1 10*3/uL (ref 0.0–0.4)
Eos: 1 %
Hematocrit: 37.4 % (ref 34.0–46.6)
Hemoglobin: 12.5 g/dL (ref 11.1–15.9)
Immature Grans (Abs): 0 10*3/uL (ref 0.0–0.1)
Immature Granulocytes: 0 %
Lymphocytes Absolute: 1.7 10*3/uL (ref 0.7–3.1)
Lymphs: 17 %
MCH: 30.2 pg (ref 26.6–33.0)
MCHC: 33.4 g/dL (ref 31.5–35.7)
MCV: 90 fL (ref 79–97)
Monocytes Absolute: 0.6 10*3/uL (ref 0.1–0.9)
Monocytes: 6 %
Neutrophils Absolute: 7.4 10*3/uL — ABNORMAL HIGH (ref 1.4–7.0)
Neutrophils: 75 %
RBC: 4.14 x10E6/uL (ref 3.77–5.28)
RDW: 13.4 % (ref 11.7–15.4)
WBC: 9.9 10*3/uL (ref 3.4–10.8)

## 2021-10-08 LAB — URINALYSIS, ROUTINE W REFLEX MICROSCOPIC
Bilirubin, UA: NEGATIVE
Glucose, UA: NEGATIVE
Ketones, UA: NEGATIVE
Leukocytes,UA: NEGATIVE
Nitrite, UA: NEGATIVE
RBC, UA: NEGATIVE
Specific Gravity, UA: 1.011 (ref 1.005–1.030)
Urobilinogen, Ur: 0.2 mg/dL (ref 0.2–1.0)
pH, UA: 7.5 (ref 5.0–7.5)

## 2021-10-08 LAB — MICROSCOPIC EXAMINATION
Bacteria, UA: NONE SEEN
Casts: NONE SEEN /lpf
RBC, Urine: NONE SEEN /hpf (ref 0–2)

## 2021-10-22 DIAGNOSIS — I5032 Chronic diastolic (congestive) heart failure: Secondary | ICD-10-CM | POA: Diagnosis not present

## 2021-10-22 DIAGNOSIS — E11610 Type 2 diabetes mellitus with diabetic neuropathic arthropathy: Secondary | ICD-10-CM | POA: Diagnosis not present

## 2021-10-22 DIAGNOSIS — D689 Coagulation defect, unspecified: Secondary | ICD-10-CM | POA: Diagnosis not present

## 2021-10-22 DIAGNOSIS — E1142 Type 2 diabetes mellitus with diabetic polyneuropathy: Secondary | ICD-10-CM | POA: Diagnosis not present

## 2021-10-22 DIAGNOSIS — I130 Hypertensive heart and chronic kidney disease with heart failure and stage 1 through stage 4 chronic kidney disease, or unspecified chronic kidney disease: Secondary | ICD-10-CM | POA: Diagnosis not present

## 2021-10-22 DIAGNOSIS — F0390 Unspecified dementia without behavioral disturbance: Secondary | ICD-10-CM | POA: Diagnosis not present

## 2021-10-22 DIAGNOSIS — Z79899 Other long term (current) drug therapy: Secondary | ICD-10-CM | POA: Diagnosis not present

## 2021-10-22 DIAGNOSIS — E1122 Type 2 diabetes mellitus with diabetic chronic kidney disease: Secondary | ICD-10-CM | POA: Diagnosis not present

## 2021-10-22 DIAGNOSIS — Z0001 Encounter for general adult medical examination with abnormal findings: Secondary | ICD-10-CM | POA: Diagnosis not present

## 2021-10-22 DIAGNOSIS — E11621 Type 2 diabetes mellitus with foot ulcer: Secondary | ICD-10-CM | POA: Diagnosis not present

## 2021-10-22 DIAGNOSIS — E1151 Type 2 diabetes mellitus with diabetic peripheral angiopathy without gangrene: Secondary | ICD-10-CM | POA: Diagnosis not present

## 2021-10-22 DIAGNOSIS — E559 Vitamin D deficiency, unspecified: Secondary | ICD-10-CM | POA: Diagnosis not present

## 2021-11-04 DIAGNOSIS — N1832 Chronic kidney disease, stage 3b: Secondary | ICD-10-CM | POA: Diagnosis not present

## 2021-11-05 ENCOUNTER — Institutional Professional Consult (permissible substitution): Payer: Medicare Other | Admitting: Neurology

## 2021-11-23 ENCOUNTER — Ambulatory Visit: Payer: Medicare Other | Admitting: Cardiology

## 2022-01-08 ENCOUNTER — Other Ambulatory Visit: Payer: Self-pay

## 2022-01-08 ENCOUNTER — Encounter (HOSPITAL_COMMUNITY): Payer: Self-pay

## 2022-01-08 ENCOUNTER — Emergency Department (HOSPITAL_COMMUNITY)
Admission: EM | Admit: 2022-01-08 | Discharge: 2022-01-09 | Discharge status: Against Medical Advice | Payer: Medicare Other | Attending: Emergency Medicine | Admitting: Emergency Medicine

## 2022-01-08 ENCOUNTER — Emergency Department (HOSPITAL_BASED_OUTPATIENT_CLINIC_OR_DEPARTMENT_OTHER): Payer: Medicare Other

## 2022-01-08 DIAGNOSIS — R600 Localized edema: Secondary | ICD-10-CM | POA: Diagnosis not present

## 2022-01-08 DIAGNOSIS — M79606 Pain in leg, unspecified: Secondary | ICD-10-CM | POA: Diagnosis not present

## 2022-01-08 DIAGNOSIS — M79605 Pain in left leg: Secondary | ICD-10-CM | POA: Diagnosis not present

## 2022-01-08 DIAGNOSIS — R609 Edema, unspecified: Secondary | ICD-10-CM | POA: Diagnosis not present

## 2022-01-08 DIAGNOSIS — M79604 Pain in right leg: Secondary | ICD-10-CM | POA: Diagnosis not present

## 2022-01-08 DIAGNOSIS — Z5321 Procedure and treatment not carried out due to patient leaving prior to being seen by health care provider: Secondary | ICD-10-CM | POA: Insufficient documentation

## 2022-01-08 LAB — CBC WITH DIFFERENTIAL/PLATELET
Abs Immature Granulocytes: 0.04 10*3/uL (ref 0.00–0.07)
Basophils Absolute: 0.1 10*3/uL (ref 0.0–0.1)
Basophils Relative: 1 %
Eosinophils Absolute: 0.1 10*3/uL (ref 0.0–0.5)
Eosinophils Relative: 1 %
HCT: 39.8 % (ref 36.0–46.0)
Hemoglobin: 12.8 g/dL (ref 12.0–15.0)
Immature Granulocytes: 0 %
Lymphocytes Relative: 13 %
Lymphs Abs: 1.3 10*3/uL (ref 0.7–4.0)
MCH: 30 pg (ref 26.0–34.0)
MCHC: 32.2 g/dL (ref 30.0–36.0)
MCV: 93.2 fL (ref 80.0–100.0)
Monocytes Absolute: 0.5 10*3/uL (ref 0.1–1.0)
Monocytes Relative: 5 %
Neutro Abs: 7.6 10*3/uL (ref 1.7–7.7)
Neutrophils Relative %: 80 %
Platelets: 188 10*3/uL (ref 150–400)
RBC: 4.27 MIL/uL (ref 3.87–5.11)
RDW: 14.3 % (ref 11.5–15.5)
WBC: 9.5 10*3/uL (ref 4.0–10.5)
nRBC: 0 % (ref 0.0–0.2)

## 2022-01-08 LAB — BASIC METABOLIC PANEL
Anion gap: 9 (ref 5–15)
BUN: 10 mg/dL (ref 8–23)
CO2: 27 mmol/L (ref 22–32)
Calcium: 9.4 mg/dL (ref 8.9–10.3)
Chloride: 96 mmol/L — ABNORMAL LOW (ref 98–111)
Creatinine, Ser: 1.43 mg/dL — ABNORMAL HIGH (ref 0.44–1.00)
GFR, Estimated: 36 mL/min — ABNORMAL LOW (ref >60)
Glucose, Bld: 193 mg/dL — ABNORMAL HIGH (ref 70–99)
Potassium: 4 mmol/L (ref 3.5–5.1)
Sodium: 132 mmol/L — ABNORMAL LOW (ref 135–145)

## 2022-01-08 LAB — BRAIN NATRIURETIC PEPTIDE: B Natriuretic Peptide: 28 pg/mL (ref 0.0–100.0)

## 2022-01-08 NOTE — ED Triage Notes (Signed)
Per EMS- patient c/o bilateral lower leg pain since 07000 today. Patient has pedal edema.

## 2022-01-08 NOTE — Progress Notes (Signed)
BLE venous duplex has been completed.  Preliminary results given to Domenic Moras, PA-C.   Results can be found under chart review under CV PROC. 01/08/2022 7:26 PM Reynold Mantell RVT, RDMS

## 2022-01-08 NOTE — ED Provider Triage Note (Signed)
Emergency Medicine Provider Triage Evaluation Note  [name] , a 81 y.o. female  was evaluated in triage.  Pt complains of leg pain. Hx of Charcot foot, report having pain to both foot radiates up towards her legs today.  Also notice increase leg swelling and having some tingling sensation to legs.  No cp or sob, no fever or cough  Review of Systems  Positive: As above Negative: As above  Physical Exam  BP (!) 142/89 (BP Location: Left Arm)   Pulse 80   Temp 98.4 F (36.9 C) (Oral)   Resp 16   SpO2 95%  Gen:   Awake, no distress   Resp:  Normal effort  MSK:   Moves extremities without difficulty  Other:    Medical Decision Making  Medically screening exam initiated at 6:17 PM.  Appropriate orders placed.  [name] was informed that the remainder of the evaluation will be completed by another provider, this initial triage assessment does not replace that evaluation, and the importance of remaining in the ED until their evaluation is complete.     Domenic Moras, PA-C 01/08/22 1821

## 2022-01-09 NOTE — ED Notes (Signed)
Pt went home because she did not want to wait.

## 2022-01-11 IMAGING — CR DG CHEST 1V
1 series · 1 of 1 positions shown · non-contrast
Comparison: 02/18/2020

CLINICAL DATA: Chest pain and shortness of breath

EXAM:
CHEST  1 VIEW

[chest ap]
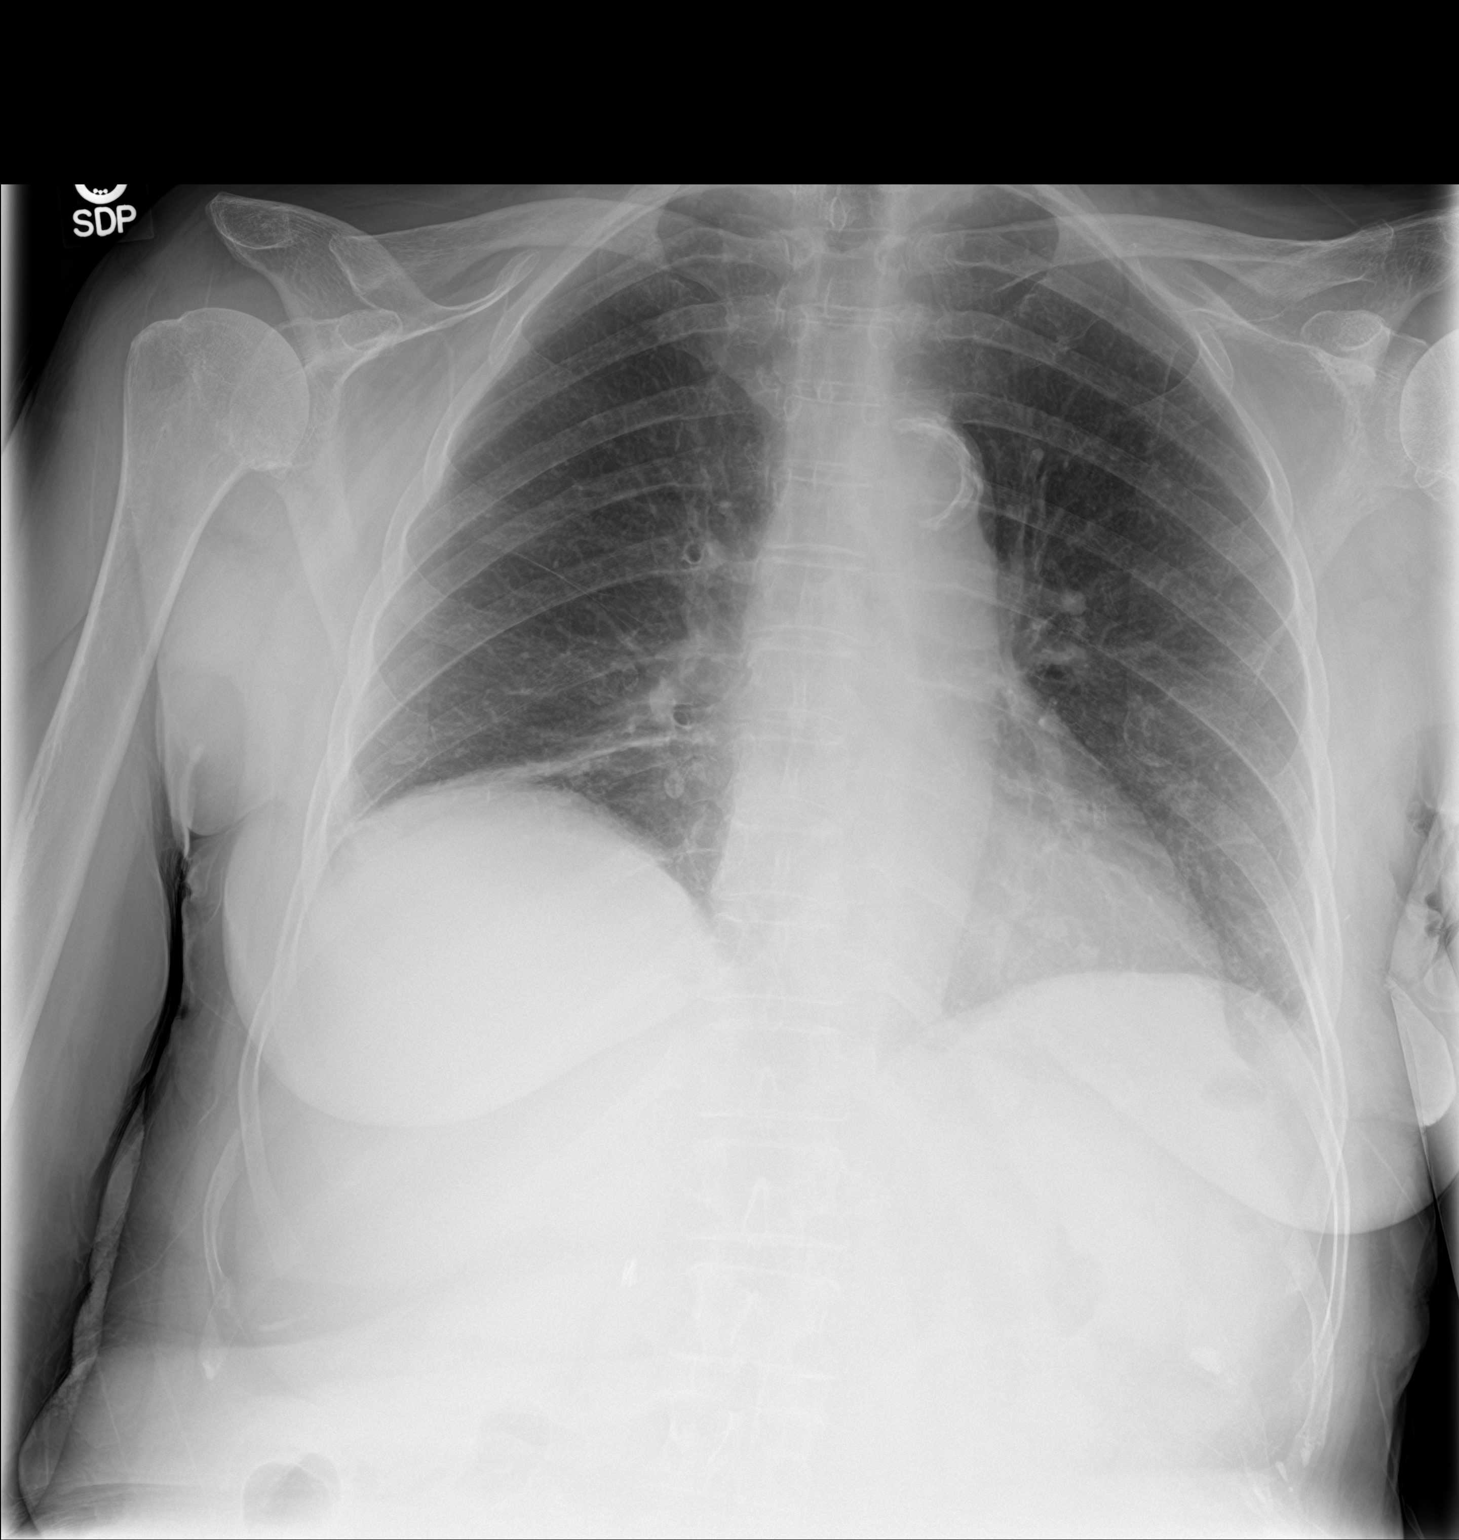

[1 of 1 positions shown; findings below may reference images not displayed]

FINDINGS: There is right basilar atelectasis. Calcific aortic atherosclerosis.
No focal airspace consolidation or pulmonary edema. Normal pleural
spaces.
IMPRESSION: Right basilar atelectasis.

## 2022-01-13 DIAGNOSIS — Z5189 Encounter for other specified aftercare: Secondary | ICD-10-CM | POA: Diagnosis not present

## 2022-01-14 DIAGNOSIS — Z79899 Other long term (current) drug therapy: Secondary | ICD-10-CM | POA: Diagnosis not present

## 2022-01-14 IMAGING — CR DG CHEST 2V
3 series · 3 of 3 positions shown · non-contrast
Comparison: 07/18/2020

CLINICAL DATA: Cough with chest pain.

EXAM:
CHEST - 2 VIEW

[chest lat (1 of 2)]
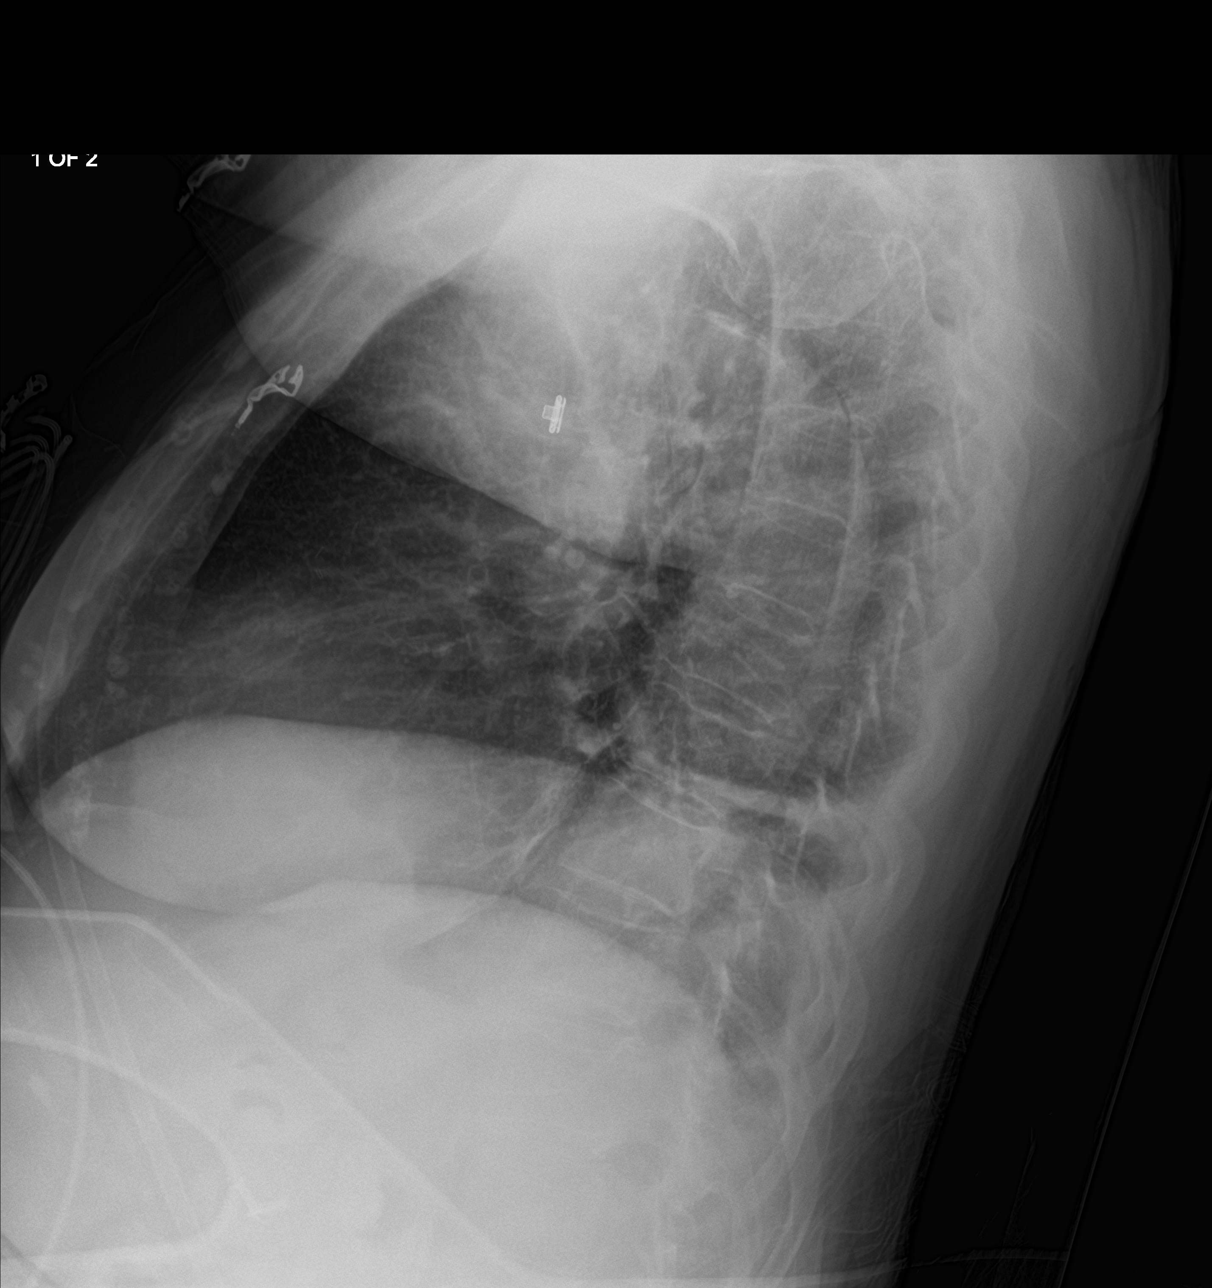

[chest ap]
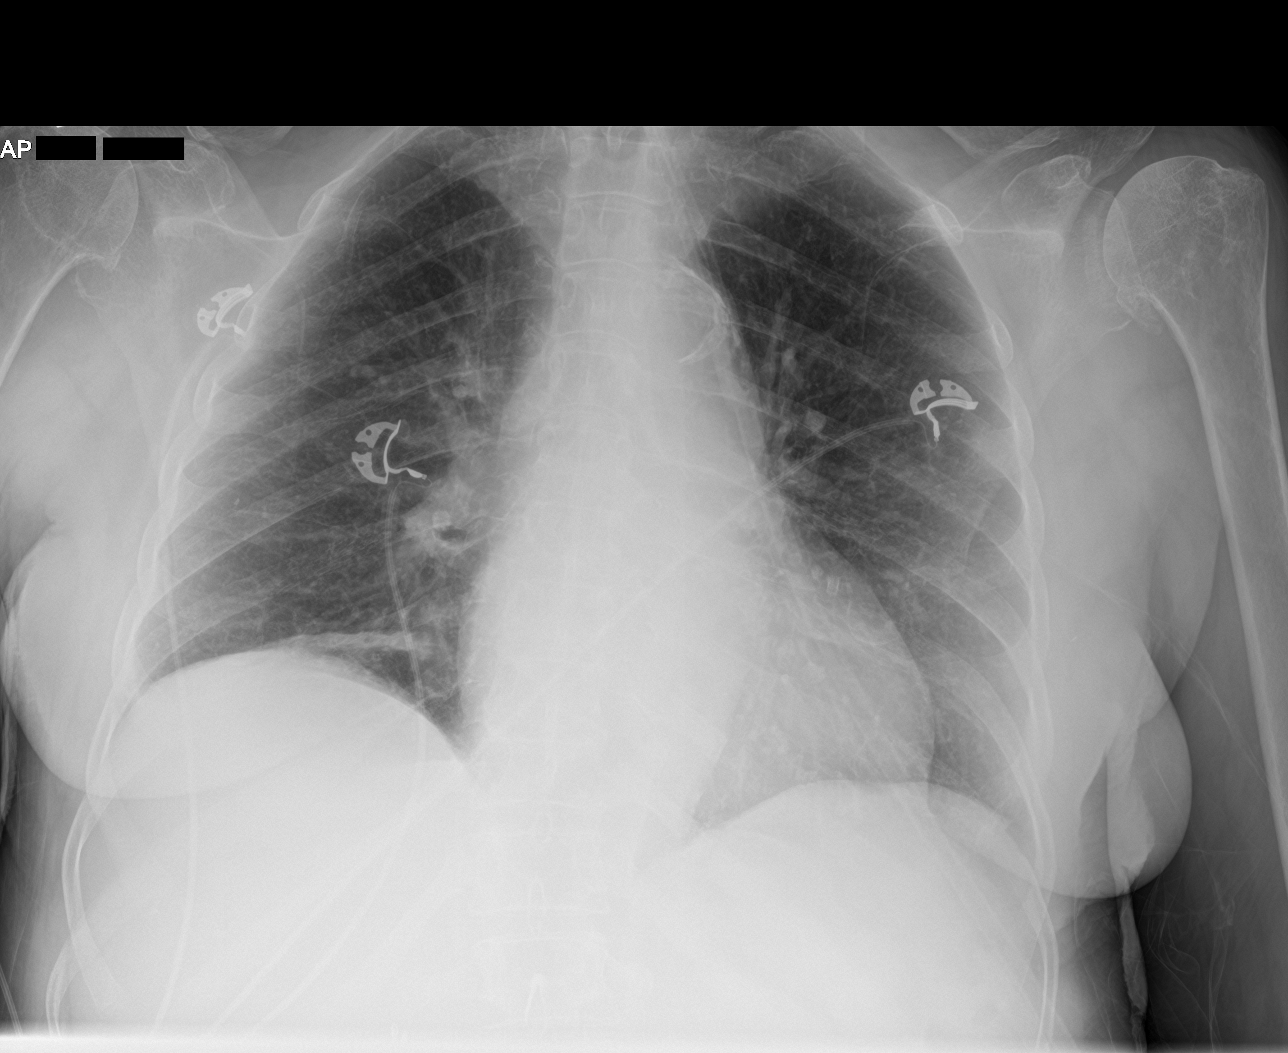

[chest lat (2 of 2)]
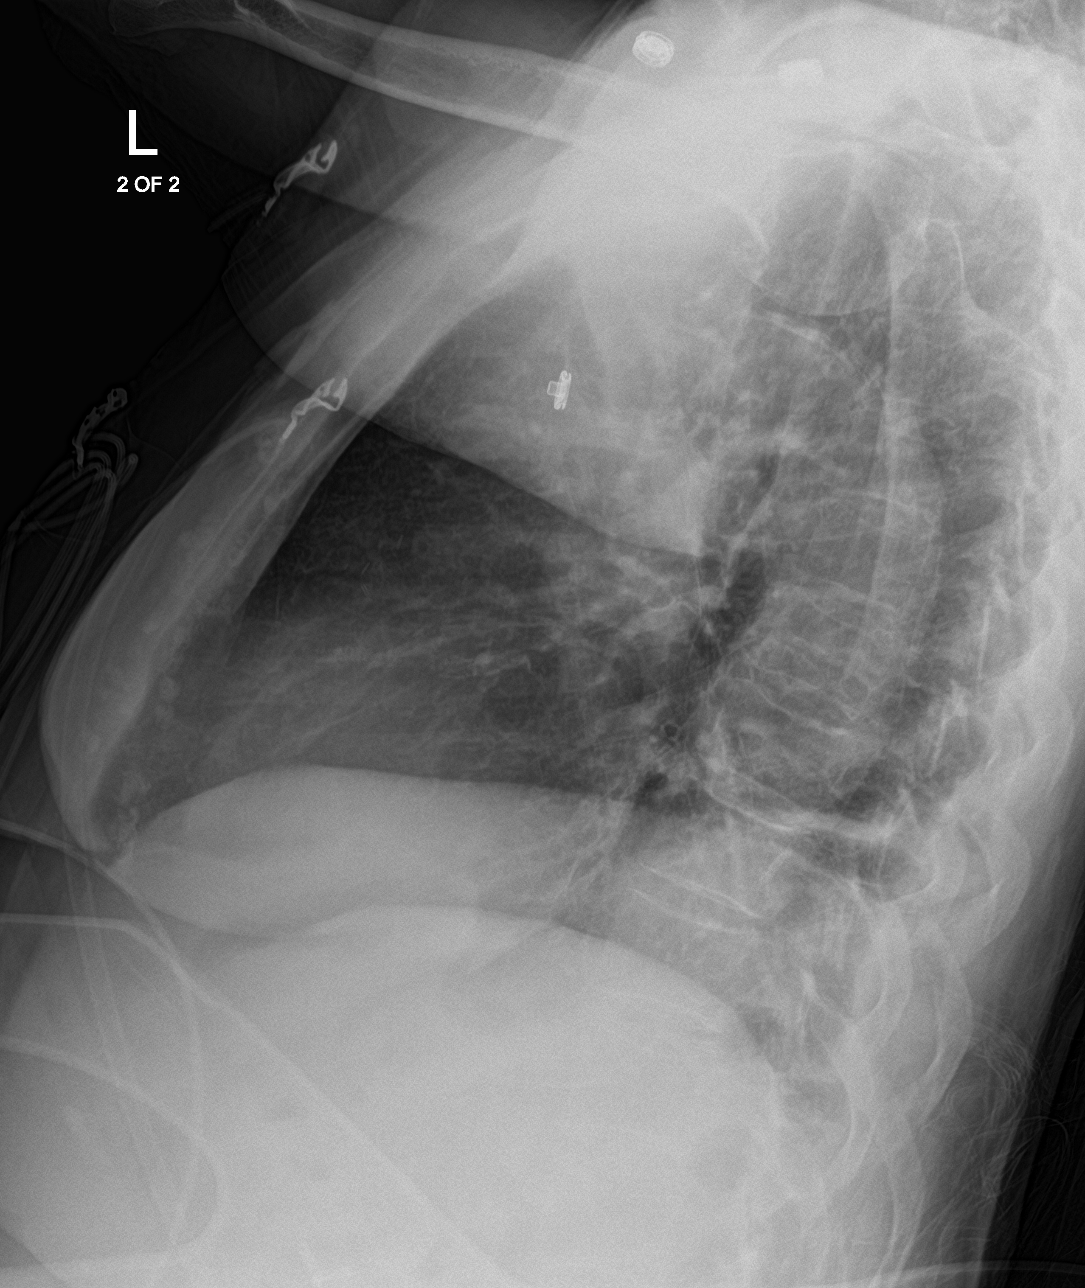

[3 of 3 positions shown; findings below may reference images not displayed]

FINDINGS: The lungs are clear without focal pneumonia, edema, pneumothorax or
pleural effusion. Interstitial markings are diffusely coarsened with
chronic features. Bandlike atelectasis or scarring at the right base
is similar to prior. Cardiopericardial silhouette is at upper limits
of normal for size. Telemetry leads overlie the chest.
IMPRESSION: Chronic interstitial changes without acute cardiopulmonary findings.

## 2022-01-22 DIAGNOSIS — N1832 Chronic kidney disease, stage 3b: Secondary | ICD-10-CM | POA: Diagnosis not present

## 2022-01-22 DIAGNOSIS — Z794 Long term (current) use of insulin: Secondary | ICD-10-CM | POA: Diagnosis not present

## 2022-01-22 DIAGNOSIS — I5032 Chronic diastolic (congestive) heart failure: Secondary | ICD-10-CM | POA: Diagnosis not present

## 2022-01-22 DIAGNOSIS — Z0000 Encounter for general adult medical examination without abnormal findings: Secondary | ICD-10-CM | POA: Diagnosis not present

## 2022-01-22 DIAGNOSIS — J449 Chronic obstructive pulmonary disease, unspecified: Secondary | ICD-10-CM | POA: Diagnosis not present

## 2022-01-22 DIAGNOSIS — E1142 Type 2 diabetes mellitus with diabetic polyneuropathy: Secondary | ICD-10-CM | POA: Diagnosis not present

## 2022-01-22 DIAGNOSIS — G4700 Insomnia, unspecified: Secondary | ICD-10-CM | POA: Diagnosis not present

## 2022-01-22 DIAGNOSIS — F03A3 Unspecified dementia, mild, with mood disturbance: Secondary | ICD-10-CM | POA: Diagnosis not present

## 2022-01-22 DIAGNOSIS — Z23 Encounter for immunization: Secondary | ICD-10-CM | POA: Diagnosis not present

## 2022-01-22 DIAGNOSIS — I130 Hypertensive heart and chronic kidney disease with heart failure and stage 1 through stage 4 chronic kidney disease, or unspecified chronic kidney disease: Secondary | ICD-10-CM | POA: Diagnosis not present

## 2022-02-17 ENCOUNTER — Other Ambulatory Visit: Payer: Self-pay | Admitting: Student

## 2022-02-17 DIAGNOSIS — R55 Syncope and collapse: Secondary | ICD-10-CM | POA: Diagnosis not present

## 2022-02-17 DIAGNOSIS — Z9181 History of falling: Secondary | ICD-10-CM

## 2022-02-17 DIAGNOSIS — N184 Chronic kidney disease, stage 4 (severe): Secondary | ICD-10-CM | POA: Diagnosis not present

## 2022-02-17 DIAGNOSIS — E11610 Type 2 diabetes mellitus with diabetic neuropathic arthropathy: Secondary | ICD-10-CM | POA: Diagnosis not present

## 2022-02-17 DIAGNOSIS — Z79899 Other long term (current) drug therapy: Secondary | ICD-10-CM | POA: Diagnosis not present

## 2022-02-17 DIAGNOSIS — E1142 Type 2 diabetes mellitus with diabetic polyneuropathy: Secondary | ICD-10-CM | POA: Diagnosis not present

## 2022-02-17 DIAGNOSIS — I130 Hypertensive heart and chronic kidney disease with heart failure and stage 1 through stage 4 chronic kidney disease, or unspecified chronic kidney disease: Secondary | ICD-10-CM | POA: Diagnosis not present

## 2022-02-17 DIAGNOSIS — Z794 Long term (current) use of insulin: Secondary | ICD-10-CM | POA: Diagnosis not present

## 2022-02-17 DIAGNOSIS — F03A3 Unspecified dementia, mild, with mood disturbance: Secondary | ICD-10-CM | POA: Diagnosis not present

## 2022-02-17 DIAGNOSIS — I5032 Chronic diastolic (congestive) heart failure: Secondary | ICD-10-CM | POA: Diagnosis not present

## 2022-02-17 DIAGNOSIS — N1832 Chronic kidney disease, stage 3b: Secondary | ICD-10-CM | POA: Diagnosis not present

## 2022-02-17 DIAGNOSIS — S0990XA Unspecified injury of head, initial encounter: Secondary | ICD-10-CM | POA: Diagnosis not present

## 2022-02-22 ENCOUNTER — Encounter: Payer: Self-pay | Admitting: Neurology

## 2022-02-22 ENCOUNTER — Ambulatory Visit: Payer: Medicare Other | Admitting: Neurology

## 2022-02-24 ENCOUNTER — Emergency Department (HOSPITAL_COMMUNITY): Payer: Medicare Other

## 2022-02-24 ENCOUNTER — Encounter (HOSPITAL_COMMUNITY): Payer: Self-pay

## 2022-02-24 ENCOUNTER — Other Ambulatory Visit: Payer: Self-pay

## 2022-02-24 ENCOUNTER — Observation Stay (HOSPITAL_COMMUNITY)
Admission: EM | Admit: 2022-02-24 | Discharge: 2022-02-25 | Disposition: A | Discharge status: Home Health | Payer: Medicare Other | Attending: Internal Medicine | Admitting: Internal Medicine

## 2022-02-24 DIAGNOSIS — I959 Hypotension, unspecified: Secondary | ICD-10-CM | POA: Diagnosis not present

## 2022-02-24 DIAGNOSIS — Z7984 Long term (current) use of oral hypoglycemic drugs: Secondary | ICD-10-CM | POA: Insufficient documentation

## 2022-02-24 DIAGNOSIS — D72829 Elevated white blood cell count, unspecified: Secondary | ICD-10-CM | POA: Insufficient documentation

## 2022-02-24 DIAGNOSIS — F411 Generalized anxiety disorder: Secondary | ICD-10-CM | POA: Diagnosis present

## 2022-02-24 DIAGNOSIS — R55 Syncope and collapse: Principal | ICD-10-CM | POA: Insufficient documentation

## 2022-02-24 DIAGNOSIS — F0390 Unspecified dementia without behavioral disturbance: Secondary | ICD-10-CM | POA: Insufficient documentation

## 2022-02-24 DIAGNOSIS — I130 Hypertensive heart and chronic kidney disease with heart failure and stage 1 through stage 4 chronic kidney disease, or unspecified chronic kidney disease: Secondary | ICD-10-CM | POA: Insufficient documentation

## 2022-02-24 DIAGNOSIS — M542 Cervicalgia: Secondary | ICD-10-CM | POA: Diagnosis not present

## 2022-02-24 DIAGNOSIS — I672 Cerebral atherosclerosis: Secondary | ICD-10-CM | POA: Diagnosis not present

## 2022-02-24 DIAGNOSIS — Z96642 Presence of left artificial hip joint: Secondary | ICD-10-CM | POA: Diagnosis not present

## 2022-02-24 DIAGNOSIS — Z9861 Coronary angioplasty status: Secondary | ICD-10-CM | POA: Insufficient documentation

## 2022-02-24 DIAGNOSIS — N179 Acute kidney failure, unspecified: Secondary | ICD-10-CM | POA: Insufficient documentation

## 2022-02-24 DIAGNOSIS — R0789 Other chest pain: Secondary | ICD-10-CM | POA: Diagnosis not present

## 2022-02-24 DIAGNOSIS — E1142 Type 2 diabetes mellitus with diabetic polyneuropathy: Secondary | ICD-10-CM

## 2022-02-24 DIAGNOSIS — J9811 Atelectasis: Secondary | ICD-10-CM | POA: Diagnosis not present

## 2022-02-24 DIAGNOSIS — I5032 Chronic diastolic (congestive) heart failure: Secondary | ICD-10-CM | POA: Insufficient documentation

## 2022-02-24 DIAGNOSIS — B0229 Other postherpetic nervous system involvement: Secondary | ICD-10-CM

## 2022-02-24 DIAGNOSIS — Z794 Long term (current) use of insulin: Secondary | ICD-10-CM | POA: Insufficient documentation

## 2022-02-24 DIAGNOSIS — Z7901 Long term (current) use of anticoagulants: Secondary | ICD-10-CM | POA: Diagnosis not present

## 2022-02-24 DIAGNOSIS — Z96639 Presence of unspecified artificial wrist joint: Secondary | ICD-10-CM | POA: Diagnosis not present

## 2022-02-24 DIAGNOSIS — Z853 Personal history of malignant neoplasm of breast: Secondary | ICD-10-CM | POA: Insufficient documentation

## 2022-02-24 DIAGNOSIS — I2510 Atherosclerotic heart disease of native coronary artery without angina pectoris: Secondary | ICD-10-CM | POA: Insufficient documentation

## 2022-02-24 DIAGNOSIS — E785 Hyperlipidemia, unspecified: Secondary | ICD-10-CM | POA: Diagnosis present

## 2022-02-24 DIAGNOSIS — Z86718 Personal history of other venous thrombosis and embolism: Secondary | ICD-10-CM | POA: Diagnosis not present

## 2022-02-24 DIAGNOSIS — R739 Hyperglycemia, unspecified: Secondary | ICD-10-CM | POA: Diagnosis not present

## 2022-02-24 DIAGNOSIS — E871 Hypo-osmolality and hyponatremia: Secondary | ICD-10-CM | POA: Insufficient documentation

## 2022-02-24 DIAGNOSIS — E782 Mixed hyperlipidemia: Secondary | ICD-10-CM | POA: Diagnosis not present

## 2022-02-24 DIAGNOSIS — R079 Chest pain, unspecified: Secondary | ICD-10-CM | POA: Diagnosis not present

## 2022-02-24 DIAGNOSIS — S199XXA Unspecified injury of neck, initial encounter: Secondary | ICD-10-CM | POA: Diagnosis not present

## 2022-02-24 DIAGNOSIS — Z96652 Presence of left artificial knee joint: Secondary | ICD-10-CM | POA: Insufficient documentation

## 2022-02-24 DIAGNOSIS — Z8673 Personal history of transient ischemic attack (TIA), and cerebral infarction without residual deficits: Secondary | ICD-10-CM | POA: Diagnosis not present

## 2022-02-24 DIAGNOSIS — E119 Type 2 diabetes mellitus without complications: Secondary | ICD-10-CM

## 2022-02-24 DIAGNOSIS — I10 Essential (primary) hypertension: Secondary | ICD-10-CM | POA: Diagnosis not present

## 2022-02-24 DIAGNOSIS — Z79899 Other long term (current) drug therapy: Secondary | ICD-10-CM | POA: Insufficient documentation

## 2022-02-24 DIAGNOSIS — N1832 Chronic kidney disease, stage 3b: Secondary | ICD-10-CM

## 2022-02-24 DIAGNOSIS — F03A3 Unspecified dementia, mild, with mood disturbance: Secondary | ICD-10-CM | POA: Diagnosis not present

## 2022-02-24 DIAGNOSIS — N184 Chronic kidney disease, stage 4 (severe): Secondary | ICD-10-CM | POA: Diagnosis not present

## 2022-02-24 DIAGNOSIS — M25512 Pain in left shoulder: Secondary | ICD-10-CM | POA: Diagnosis not present

## 2022-02-24 DIAGNOSIS — N390 Urinary tract infection, site not specified: Secondary | ICD-10-CM | POA: Diagnosis not present

## 2022-02-24 DIAGNOSIS — E1122 Type 2 diabetes mellitus with diabetic chronic kidney disease: Secondary | ICD-10-CM | POA: Insufficient documentation

## 2022-02-24 DIAGNOSIS — R0689 Other abnormalities of breathing: Secondary | ICD-10-CM | POA: Diagnosis not present

## 2022-02-24 HISTORY — DX: Acute embolism and thrombosis of unspecified deep veins of unspecified lower extremity: I82.409

## 2022-02-24 HISTORY — DX: Other pulmonary embolism without acute cor pulmonale: I26.99

## 2022-02-24 LAB — CBC WITH DIFFERENTIAL/PLATELET
Abs Immature Granulocytes: 0.05 10*3/uL (ref 0.00–0.07)
Basophils Absolute: 0.1 10*3/uL (ref 0.0–0.1)
Basophils Relative: 1 %
Eosinophils Absolute: 0.2 10*3/uL (ref 0.0–0.5)
Eosinophils Relative: 2 %
HCT: 43.7 % (ref 36.0–46.0)
Hemoglobin: 14.5 g/dL (ref 12.0–15.0)
Immature Granulocytes: 1 %
Lymphocytes Relative: 18 %
Lymphs Abs: 1.9 10*3/uL (ref 0.7–4.0)
MCH: 29.9 pg (ref 26.0–34.0)
MCHC: 33.2 g/dL (ref 30.0–36.0)
MCV: 90.1 fL (ref 80.0–100.0)
Monocytes Absolute: 0.7 10*3/uL (ref 0.1–1.0)
Monocytes Relative: 6 %
Neutro Abs: 7.7 10*3/uL (ref 1.7–7.7)
Neutrophils Relative %: 72 %
Platelets: 255 10*3/uL (ref 150–400)
RBC: 4.85 MIL/uL (ref 3.87–5.11)
RDW: 13.9 % (ref 11.5–15.5)
WBC: 10.6 10*3/uL — ABNORMAL HIGH (ref 4.0–10.5)
nRBC: 0 % (ref 0.0–0.2)

## 2022-02-24 LAB — URINALYSIS, ROUTINE W REFLEX MICROSCOPIC
Bilirubin Urine: NEGATIVE
Glucose, UA: NEGATIVE mg/dL
Ketones, ur: NEGATIVE mg/dL
Nitrite: NEGATIVE
Protein, ur: 100 mg/dL — AB
Specific Gravity, Urine: 1.005 (ref 1.005–1.030)
WBC, UA: >50 WBC/hpf — ABNORMAL HIGH (ref 0–5)
pH: 7 (ref 5.0–8.0)

## 2022-02-24 LAB — CBG MONITORING, ED
Glucose-Capillary: 167 mg/dL — ABNORMAL HIGH (ref 70–99)
Glucose-Capillary: 240 mg/dL — ABNORMAL HIGH (ref 70–99)

## 2022-02-24 LAB — COMPREHENSIVE METABOLIC PANEL
ALT: 26 U/L (ref 0–44)
AST: 54 U/L — ABNORMAL HIGH (ref 15–41)
Albumin: 3.3 g/dL — ABNORMAL LOW (ref 3.5–5.0)
Alkaline Phosphatase: 132 U/L — ABNORMAL HIGH (ref 38–126)
Anion gap: 13 (ref 5–15)
BUN: 17 mg/dL (ref 8–23)
CO2: 34 mmol/L — ABNORMAL HIGH (ref 22–32)
Calcium: 9.9 mg/dL (ref 8.9–10.3)
Chloride: 82 mmol/L — ABNORMAL LOW (ref 98–111)
Creatinine, Ser: 1.87 mg/dL — ABNORMAL HIGH (ref 0.44–1.00)
GFR, Estimated: 26 mL/min — ABNORMAL LOW (ref >60)
Glucose, Bld: 161 mg/dL — ABNORMAL HIGH (ref 70–99)
Potassium: 3.7 mmol/L (ref 3.5–5.1)
Sodium: 129 mmol/L — ABNORMAL LOW (ref 135–145)
Total Bilirubin: 0.6 mg/dL (ref 0.3–1.2)
Total Protein: 8.6 g/dL — ABNORMAL HIGH (ref 6.5–8.1)

## 2022-02-24 LAB — BRAIN NATRIURETIC PEPTIDE: B Natriuretic Peptide: 25.6 pg/mL (ref 0.0–100.0)

## 2022-02-24 LAB — AMMONIA: Ammonia: 14 umol/L (ref 9–35)

## 2022-02-24 LAB — TROPONIN I (HIGH SENSITIVITY)
Troponin I (High Sensitivity): 10 ng/L (ref <18)
Troponin I (High Sensitivity): 10 ng/L (ref <18)

## 2022-02-24 LAB — D-DIMER, QUANTITATIVE: D-Dimer, Quant: 1.06 ug/mL-FEU — ABNORMAL HIGH (ref 0.00–0.50)

## 2022-02-24 LAB — OSMOLALITY: Osmolality: 288 mOsm/kg (ref 275–295)

## 2022-02-24 MED ORDER — LACTATED RINGERS IV SOLN: INTRAVENOUS | Status: AC

## 2022-02-24 MED ORDER — ROSUVASTATIN CALCIUM 20 MG PO TABS
20.0000 mg | ORAL_TABLET | Freq: Every day | ORAL | Status: DC
  Administered 2022-02-24 – 2022-02-25 (×2): 20 mg via ORAL
  Filled 2022-02-24 (×2): qty 1

## 2022-02-24 MED ORDER — METOPROLOL SUCCINATE ER 25 MG PO TB24
25.0000 mg | ORAL_TABLET | Freq: Every day | ORAL | Status: DC
  Administered 2022-02-24 – 2022-02-25 (×2): 25 mg via ORAL
  Filled 2022-02-24 (×2): qty 1

## 2022-02-24 MED ORDER — INSULIN ASPART 100 UNIT/ML IJ SOLN
0.0000 [IU] | Freq: Three times a day (TID) | INTRAMUSCULAR | Status: DC
  Administered 2022-02-25: 3 [IU] via SUBCUTANEOUS
  Administered 2022-02-25: 2 [IU] via SUBCUTANEOUS

## 2022-02-24 MED ORDER — ALPRAZOLAM 0.25 MG PO TABS
0.2500 mg | ORAL_TABLET | Freq: Two times a day (BID) | ORAL | Status: DC | PRN
  Administered 2022-02-24: 0.25 mg via ORAL
  Filled 2022-02-24: qty 1

## 2022-02-24 MED ORDER — MEMANTINE HCL 10 MG PO TABS
10.0000 mg | ORAL_TABLET | Freq: Two times a day (BID) | ORAL | Status: DC
  Administered 2022-02-24 – 2022-02-25 (×2): 10 mg via ORAL
  Filled 2022-02-24 (×3): qty 1

## 2022-02-24 MED ORDER — ACETAMINOPHEN 325 MG PO TABS
650.0000 mg | ORAL_TABLET | Freq: Four times a day (QID) | ORAL | Status: DC | PRN
  Administered 2022-02-24: 650 mg via ORAL
  Filled 2022-02-24: qty 2

## 2022-02-24 MED ORDER — PANTOPRAZOLE SODIUM 40 MG PO TBEC
40.0000 mg | DELAYED_RELEASE_TABLET | Freq: Two times a day (BID) | ORAL | Status: DC
  Administered 2022-02-25: 40 mg via ORAL
  Filled 2022-02-24: qty 1

## 2022-02-24 MED ORDER — AMITRIPTYLINE HCL 25 MG PO TABS
25.0000 mg | ORAL_TABLET | Freq: Every day | ORAL | Status: DC
  Administered 2022-02-24: 25 mg via ORAL
  Filled 2022-02-24 (×2): qty 1

## 2022-02-24 MED ORDER — MELATONIN 3 MG PO TABS
3.0000 mg | ORAL_TABLET | Freq: Every evening | ORAL | Status: DC | PRN
  Administered 2022-02-25: 3 mg via ORAL
  Filled 2022-02-24: qty 1

## 2022-02-24 MED ORDER — APIXABAN 5 MG PO TABS
5.0000 mg | ORAL_TABLET | Freq: Two times a day (BID) | ORAL | Status: DC
  Administered 2022-02-24 – 2022-02-25 (×2): 5 mg via ORAL
  Filled 2022-02-24 (×2): qty 1

## 2022-02-24 MED ORDER — ACETAMINOPHEN 650 MG RE SUPP: 650.0000 mg | Freq: Four times a day (QID) | RECTAL | Status: DC | PRN

## 2022-02-24 NOTE — ED Notes (Signed)
Report given to Grace,RN.

## 2022-02-24 NOTE — H&P (Signed)
History and Physical      [name] LXB:[id number] DOB: Oct 17, 1940 DOA: 02/24/2022  PCP: Garvin Fila, MD  Patient coming from: home   I have personally briefly reviewed patient's old medical records in [place]  Chief Complaint: loss of consciousness   HPI: [name] is a 81 y.o. 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   The following history is provided by the patient as well as her daughter, who is present at bedside, in addition to my discussions with the EDP and via chart review.  Over the last 3 days, the patient has experienced 2 episodes of loss of consciousness that occurred when she is attempted to rise from a seated to standing position.  With these episodes been associated with immediately preceding dizziness, lightheadedness, and the subjective sensation of impending loss of consciousness.  Daughter has witnessed both of these episodes, and notes no associated tonic-clonic activity, loss of bowel/bladder function, nor any associated tongue biting.  In both cases, the patient regained consciousness within a few seconds, with no associated acute confusion upon regaining consciousness.  The patient denies any associated chest pain, although the daughter noted that the patient appeared to grab at the left side of her chest immediately leading up to both of these episodes of loss of consciousness.  Not associate with any acute focal weakness, acute focal numbness, paresthesias, facial droop, dysarthria, acute change in vision, vertigo, dysphagia.   She does not believe that she hit her head as a component of either of these falls, but is on Eliquis, as further detailed below.  She has a history of acute left lower lobe pulmonary embolism diagnosed in November 2021 CTA as well as left lower extremity DVT involving the popliteal vein in December 2022.  She is now chronically anticoagulated on Eliquis, with associated good compliance.  Daughter conveys that at the time of her diagnosis of acute pulmonary embolism, that she had presented with an episode of postural syncope of very similar presentation.  In terms of additional prior thromboembolic evaluation, she underwent venous ultrasound of the bilateral lower extremities on 01/08/2022, which showed no evidence of DVT.  Additionally, she underwent VQ scan in April 2022, November 2019, and October 2015, which all showed very low probability for pulmonary embolism.  Medical history also notable for chronic diastolic heart failure, with most recent echocardiogram performed in May 2023 notable for LVEF 55 to 60% with no focal wall motion abnormalities, will demonstrating grade 1 diastolic dysfunction, normal right ventricular systolic function, trivial mitral regurgitation.  Her outpatient diuretic regimen consists of Bumex as well as metolazone.  She also has a history of stage IIIb CKD with baseline creatinine 1.4-1.6, most recent prior creatinine data point noted to be 1.43 on 01/08/2022.  She also has chronic borderline hyponatremia with baseline serum sodium range 1 32-1 37, with most recent prior serum sodium of 132 on 01/08/2022.  In the setting of a history of peripheral polyneuropathy is completion of type 2 diabetes mellitus, daughter reports recent initiation of amitriptyline.  The patient presented to her PCP earlier today for evaluation of the aforementioned to episodes of syncope, with PCP recommending  that the patient present to the emergency department for further evaluation and  management thereof.  Patient with underlying dementia, with daughter present at bedside confirming that the patient's presenting mental status represents her baseline.     ED Course:  Vital signs in the ED were notable for the following: Afebrile; heart rate 92-98; respiratory rate 29-52, systolic pressures in the 130s to 150s; oxygen saturation 97 to 98% on room air.  Labs were notable for the following: CMP notable for the following: Sodium 129, bicarbonate 34, creatinine 1.87, glucose 161, calcium, adjusted for mild hypoalbuminemia noted to be 10.5, albumin 3.3.  BNP 25.6 compared to 28 in October 2023.  High-sensitivity troponin I x2 values were both found to be 10.  D-dimer 1.06.  CBC notable for white blood cell count 10.6 compared to 9.5 in Oct '23, Hgb 14.5 compared to 12.8 in October 2023, platelet count 255 compared to 188 in October 2023.  Urinalysis has been ordered, with result currently pending.  Per my interpretation, EKG in ED demonstrated the following: Sinus rhythm with heart rate 92, normal intervals, no evidence of T wave or ST changes, including no evidence of ST elevation.  Imaging and additional notable ED work-up: 2 view chest x-[place], per radiology read, shows no evidence of acute cardiopulmonary process, including no evidence of infiltrate, edema, effusion, or pneumothorax.  CT head showed no evidence of acute intracranial process, including no evidence of intracranial hemorrhage nor any evidence of acute infarct, while CT cervical spine, per radiology read, showed no evidence of acute cervical spine fracture or subluxation injury.  While in the ED, the following were administered: (none).   Subsequently, the patient was admitted for overnight observation for further evaluation management of 2 episodes of postural syncope associated with prodrome, with presentation also notable for acute kidney injury superimposed on CKD 3B as well as acute on chronic hyponatremia, and  hypercalcemia.      Review of Systems: As per HPI otherwise 10 point review of systems negative.   Past Medical History:  Diagnosis Date   Acute blood loss anemia 02/19/2020   Acute renal failure superimposed on stage 4 chronic kidney disease ([place]) 02/21/2020   Acute respiratory failure with hypoxia ([place]) 02/21/2020   Anemia    have received iron infusions   Anxiety    Arthritis     osteoarthritis   Breast cancer ([place])    Breast cancer in female [facility])    Bilateral   CAD S/P percutaneous coronary angioplasty 2007; March 2011   Liberte' [employer] 3.0 mm 20 mm postdilated 3.6 mm in early mid LAD; status post ISR Cutting Balloon PTCA and March 11 along with PCI of distal mid lesion with a 3.0 mm 12 mm MultiLink vision BMS; the proximal stent causes jailing of SP1 and SP2 with ostial 70-80% lesions   Cancer ([place])    Melanoma, Squamous cell Carcinoma   Charcot's joint of foot, right    Charcot's joint of left foot    Chronic diastolic CHF (congestive heart failure), NYHA class 2 ([place]) 02/28/2018   Chronic kidney disease    stage 2    Closed fracture of neck of left femur ([place]) 02/08/2020   Coronary artery disease    Dementia (HCC)    mild   Diabetes mellitus type 2 with neurological manifestations ([place])    Diabetes mellitus without complication (HCC)    DVT (deep venous thrombosis) (HCC)    Dyslipidemia, goal LDL below 70    Foot pain,  bilateral    Full dentures    Genetic testing 12/03/2016   Germline genetic testing was performed through [employer]'s Common Hereditary Cancers Panel + [employer]'s Melanoma Panel. This custom panel includes analysis of the following 51 genes: APC, ATM, AXIN2, BAP1, BARD1, BMPR1A, BRCA1, BRCA2, BRIP1, CDH1, CDK4, CDKN2A, CHEK2, CTNNA1, DICER1, EPCAM, GREM1, HOXB13, KIT, MEN1, MITF, MLH1, MSH2, MSH3, MSH6, MUTYH, NBN, NF1, NTHL1, PALB2, PDGFRA, PMS2, POLD1, POL   GERD (gastroesophageal reflux disease)    GIB (gastrointestinal bleeding)    Gout     Headache(784.0)    Hip fracture ([place]) 02/08/2020   History of hematuria    Followed by Dr. Gaynelle Arabian   Hypertension, essential, benign    Hypotension    Migraine    Mild cognitive impairment    Peripheral neuropathy    Personal history of radiation therapy    Post herpetic neuralgia    Pulmonary embolism (HCC)    Restless leg syndrome    Stroke ([place])    TIA (transient ischemic attack)    multiple in the past   TIA (transient ischemic attack)    Wears glasses     Past Surgical History:  Procedure Laterality Date   ABDOMINAL HYSTERECTOMY     ANKLE FUSION Right 02/22/2018   Procedure: RIGHT SUBTALAR AND TALONAVICULAR FUSION;  Surgeon: Newt Minion, MD;  Location: [place];  Service: Orthopedics;  Laterality: Right;   APPENDECTOMY     BIOPSY  02/19/2020   Procedure: BIOPSY;  Surgeon: Otis Brace, MD;  Location: [place] [facility];  Service: Gastroenterology;;   BLADDER SUSPENSION     BREAST LUMPECTOMY Bilateral 2018   BREAST LUMPECTOMY WITH RADIOACTIVE SEED AND SENTINEL LYMPH NODE BIOPSY Bilateral 12/14/2016   Procedure: BILATERAL BREAST LUMPECTOMY WITH BILATERAL  RADIOACTIVE SEED AND BILATERAL AXILLARY  SENTINEL LYMPH NODE BIOPSY ERAS PATHWAY;  Surgeon: Alphonsa Overall, MD;  Location: [place];  Service: General;  Laterality: Bilateral;  [place]  02/24/2006   85% stenosis in the proximal portion of LAD-arrangements made for PCI on 02/25/2006   CARDIAC CATHETERIZATION  02/25/2006   80% LAD lesion stented with a 3x39m Liberte stent resulting in reduction of 80% lesion to 0% residual   CARDIAC CATHETERIZATION  04/26/2006   Medical management   CARDIAC CATHETERIZATION  06/24/2009   60-70% re-stenosis in the proximal LAD. A 3.25x15 cutting balloon, 3 inflations - 14atm-38sec, 13atm-39sec, and 12atm-40sec reduced to less than 10%. 60% stenosis of the mid/distal LAD stented with a 3x164mMultilink stent.   CARDIAC CATHETERIZATION  07/09/2009   Medical  management   CARDIAC CATHETERIZATION     CAROTID DOPPLER  06/24/2009   40-59% R ICA stenosis. No significant ICA stenosis noted   CHOLECYSTECTOMY     CORONARY ANGIOPLASTY     DILATION AND CURETTAGE OF UTERUS     ESOPHAGOGASTRODUODENOSCOPY N/A 02/26/2020   Procedure: ESOPHAGOGASTRODUODENOSCOPY (EGD);  Surgeon: MaClarene EssexMD;  [place]: [facility] Service: Endoscopy;  Laterality: N/A;   ESOPHAGOGASTRODUODENOSCOPY (EGD) WITH PROPOFOL N/A 02/19/2020   Procedure: ESOPHAGOGASTRODUODENOSCOPY (EGD) WITH PROPOFOL;  Surgeon: BrOtis BraceMD;  [place] [facility] Service: Gastroenterology;  Laterality: N/A;   ESOPHAGOGASTRODUODENOSCOPY (EGD) WITH PROPOFOL Left 02/23/2020   Procedure: ESOPHAGOGASTRODUODENOSCOPY (EGD) WITH PROPOFOL;  Surgeon: OuArta SilenceMD;  Location: [facility] Service: Endoscopy;  Laterality: Left;   HIP ARTHROPLASTY Left 02/10/2020   Procedure: ARTHROPLASTY BIPOLAR HIP (HEMIARTHROPLASTY) POSTERIOR LATERAL;  Surgeon: MuRenette ButtersMD;  [place] [facility] Service: Orthopedics;  Laterality: Left;  JOINT REPLACEMENT Left    LESION REMOVAL Right 03/11/2015   Procedure:  EXCISION OF RIGHT PRE TIBIAL LESION;  Surgeon: Judeth Horn, MD;  Location: [place];  Service: General;  Laterality: Right;   MASS EXCISION Left 06/10/2014   Procedure: EXCISION LEFT LOWER LEG LESION;  Surgeon: Doreen Salvage, MD;  Location: [place];  Service: General;  Laterality: Left;   MASS EXCISION Left 09/05/2015   Procedure: EXCISION OF LEFT FOREARM  MASS;  Surgeon: Judeth Horn, MD;  Location: [place];  Service: General;  Laterality: Left;   NM MYOVIEW LTD  10/2011   No ischemia or infarction.  Normal EF.  LOW RISK. (Short bursts of 5 beats NSVT during recovery otherwise normal)   POLYPECTOMY  02/19/2020   Procedure: POLYPECTOMY;  Surgeon: Otis Brace, MD;  Location: [place] [facility];  Service: Gastroenterology;;   SHOULDER ARTHROSCOPY   10/15   SHOULDER ARTHROSCOPY  1995   left   TEE WITHOUT CARDIOVERSION N/A 08/03/2017   Procedure: TRANSESOPHAGEAL ECHOCARDIOGRAM (TEE);  Surgeon: Skeet Latch, MD;  Location: [place];  Service: Cardiovascular;; EF 60-65% with no wall motion normalities.  No atrial thrombus noted.  Lightly findings consistent with a lipomatous hypertrophy of the atrial septum.    TENDON REPAIR Left 05/12/2016   Procedure: Left Anterior Tibial Tendon Reconstruction;  Surgeon: Newt Minion, MD;  Location: [place];  Service: Orthopedics;  Laterality: Left;   TOTAL KNEE ARTHROPLASTY  2005   left   TRANSESOPHAGEAL ECHOCARDIOGRAM  08/03/2016   : EF 60-65% no wall motion normalities.  No atrial thrombus.  Lipomatous hypertrophy of the atrial septum.  No mass noted.   TRANSESOPHAGEAL ECHOCARDIOGRAM  07/2017   EF 60 to 65%.  No R WMA.  No atrial thrombus noted.  Lipomatous IAS.   TRANSTHORACIC ECHOCARDIOGRAM  02/25/2017   :EF 65-70%.  GR 1 DD.  No or WMA.  Moderate aortic calcification/sclerosis but no stenosis.   TRANSTHORACIC ECHOCARDIOGRAM  07/2017   Vigorous LV function.  EF 65-70%.  Mild diastolic dysfunction with no RWMA. GR 1 DD.  Mild aortic sclerosis/no stenosis.  Questionable right atrial mass discounted with TEE)    WRIST ARTHROPLASTY  2010   cancer lt wrist    Social History:  reports that she has never smoked. She has never used smokeless tobacco. She reports that she does not drink alcohol and does not use drugs.   Allergies  Allergen Reactions   Nsaids Nausea And Vomiting and Palpitations   Reglan [Metoclopramide] Other (See Comments)    Seizures  Tardive dyskinesia Paradoxical reaction not relieved by benadryl   Ultram [Tramadol] Other (See Comments)    Pt has seizure activity with this medication   Lipitor [Atorvastatin] Swelling and Other (See Comments)    Myalgias     Lyrica [Pregabalin] Swelling and Other (See Comments)    Weight gain    Urecholine [Bethanechol] Nausea Only and  Palpitations    Family History  Problem Relation Age of Onset   Diabetes Mother    Kidney disease Mother    Diabetes Sister    Diabetes Brother    Hypertension Mother    Hypertension Father    Emphysema Father    Heart attack Father    Heart disease Father    Arthritis Sister    Diabetes Sister    Hypertension Sister    Breast cancer Sister 28       treated with mastectomy   Cervical cancer Sister 56  Hypertension Brother    Hyperlipidemia Brother    Diabetes Brother    Stroke Brother    Diabetes Sister    Hypertension Sister    Stomach cancer Sister 57   Hypertension Brother    ALS Brother        d.62s   Breast cancer Daughter 11       triple negative treated with lumpectomy, chemo, radiation   Heart attack Daughter 25       d.45   COPD Daughter    Cancer Paternal Aunt        unspecified type   Cancer Paternal Uncle        unspecified type   Cancer Paternal Uncle        unspecified type    Family history reviewed and not pertinent    Prior to Admission medications   Medication Sig Start Date End Date Taking? Authorizing Provider  acetaminophen-codeine (TYLENOL #3) 300-30 MG tablet Take 1 tablet by mouth every 4 (four) hours as needed for moderate pain.    [provider]  ALPRAZolam Duanne Moron) 0.25 MG tablet Take 1-2 tablets (0.25-0.5 mg total) by mouth 2 (two) times daily as needed for anxiety. 04/17/21   Truitt Merle, MD  amitriptyline (ELAVIL) 25 MG tablet Take 1 tablet (25 mg total) by mouth at bedtime. 08/20/21   Ghimire, Henreitta Leber, MD  Biotin 10000 MCG TABS Take 1 tablet by mouth every evening.    [provider]  Blood Glucose Monitoring Suppl (FREESTYLE LITE) w/Device KIT use as directed 07/21/20   Kayleen Memos, DO  bumetanide (BUMEX) 2 MG tablet Take 4 mg by mouth daily.    [provider]  Cholecalciferol (VITAMIN D3) 50 MCG (2000 UT) capsule Take 2,000 Units by mouth every evening.    [provider]  Coenzyme Q10 (COQ10  PO) Take 1 capsule by mouth every evening.    [provider]  COLCRYS 0.6 MG tablet TAKE 1 TABLET DAILY AS NEEDED FOR GOUT FLARE. 12/27/18   Newt Minion, MD  CRANBERRY PO Take 4,200 mg by mouth every evening.    [provider]  ELIQUIS 5 MG TABS tablet Take 5 mg by mouth 2 (two) times daily. 07/13/21   [provider]  febuxostat (ULORIC) 40 MG tablet Take 40 mg by mouth daily. 12/31/19   [provider]  gabapentin (NEURONTIN) 300 MG capsule Take 300 mg by mouth at bedtime. 12/18/19   [provider]  glipiZIDE (GLUCOTROL XL) 2.5 MG 24 hr tablet Take 2.5 mg by mouth daily with breakfast.    [provider]  glucose blood (FREESTYLE LITE) test strip Test blood sugars up to 4 times daily 07/21/20   Kayleen Memos, DO  insulin glargine (LANTUS) 100 UNIT/ML Solostar Pen Inject 5 Units into the skin daily. Patient not taking: Reported on 10/06/2021 07/21/20   Kayleen Memos, DO  Insulin Pen Needle (PENTIPS) 32G X 4 MM MISC Use as directed 07/21/20   Kayleen Memos, DO  Insulin Pen Needle 32G X 4 MM MISC 5 Units by Does not apply route in the morning and at bedtime. 07/21/20   Kayleen Memos, DO  insulin regular (NOVOLIN R) 100 units/mL injection Inject into the skin 3 (three) times daily before meals. Started last week 4/28: Sliding scale before meals: <150 none,  151-199 - 2 units                                                                            200-249 - 4 units                                                                            250-299 - 6 units                                                                            300-349 - 8 units                                                                            350> 10 units    [provider]  JANUVIA 25 MG tablet Take 25 mg by mouth daily. 12/26/19   [provider]  Javier Docker Oil 500 MG CAPS Take  500 mg by mouth daily.    [provider]  Lancets (FREESTYLE) lancets Test blood sugars up to 4 times daily 07/21/20   Kayleen Memos, DO  memantine [facility] TITRATION PAK) tablet pack 5 mg/day for =1 week; 5 mg twice daily for =1 week; 15 mg/day given in 5 mg and 10 mg separated doses for =1 week; then 10 mg twice daily 10/06/21   Garvin Fila, MD  memantine (NAMENDA) 10 MG tablet Take 1 tablet (10 mg total) by mouth 2 (two) times daily. 10/06/21   Garvin Fila, MD  Methylfol-Algae-B12-Acetylcyst (CEREFOLIN NAC PO) Take 1 tablet by mouth in the morning.    [provider]  metolazone (ZAROXOLYN) 2.5 MG tablet Take 2.5 mg by mouth daily. 09/21/21   [provider]  metoprolol succinate (TOPROL-XL) 25 MG 24 hr tablet Take 25 mg by mouth at bedtime.  12/27/19   [provider]  Multiple Vitamin (MULTIVITAMIN WITH MINERALS) TABS tablet Take 1 tablet by mouth daily. 03/03/20   Terrilee Croak, MD  nitroGLYCERIN (NITROLINGUAL) 0.4 MG/SPRAY spray Place 1 spray under the tongue every 5 (five) minutes x 3 doses as needed for chest pain.    [provider]  pantoprazole (PROTONIX) 40 MG tablet Take 1 tablet (40 mg total) by mouth 2 (two) times daily before a meal. 03/02/20   Dahal, Marlowe Aschoff, MD  potassium chloride SA (KLOR-CON M) 20 MEQ tablet Take 20 mEq by  mouth daily.    [provider]  pramipexole (MIRAPEX) 0.25 MG tablet TAKE 1 TO 2 TABLETS AS INSTRUCTED, 1 TABLET AT 4:00 P.M. AND 2 TABLETS AT BEDTIME 09/27/19   Frann Rider, NP  rosuvastatin (CRESTOR) 20 MG tablet Take 20 mg by mouth daily.    [provider]  UNABLE TO FIND Take 1 tablet by mouth daily. Med Name: Resveratrol 551m    [provider]     Objective    Physical Exam: Vitals:   02/24/22 1624 02/24/22 1634 02/24/22 1938  BP:  (!) 132/91 (!) 151/100  Pulse:  98 92  Resp:  16 18  Temp:  97.8 F (36.6 C) 97.6 F (36.4 C)  TempSrc:  Oral Oral  SpO2: 98% 98% 97%   Weight:  82.1 kg   Height:  _0  (1.676 m)     General: appears to be stated age; alert, oriented Skin: warm, dry, no rash Head:  AT/[place] Mouth:  Oral mucosa membranes appear dry, normal dentition Neck: supple; trachea midline Heart:  RRR; did not appreciate any M/R/G Lungs: CTAB, did not appreciate any wheezes, rales, or rhonchi Abdomen: + BS; soft, ND, NT Vascular: 2+ pedal pulses b/l; 2+ radial pulses b/l Extremities: 1-2+ edema in b/l LE's, no muscle wasting Neuro: strength and sensation intact in upper and lower extremities b/l    Labs on Admission: I have personally reviewed following labs and imaging studies  CBC: Recent Labs  Lab 02/24/22 1852  WBC 10.6*  NEUTROABS 7.7  HGB 14.5  HCT 43.7  MCV 90.1  PLT 2500  Basic Metabolic Panel: Recent Labs  Lab 02/24/22 1650  NA 129*  K 3.7  CL 82*  CO2 34*  GLUCOSE 161*  BUN 17  CREATININE 1.87*  CALCIUM 9.9   GFR: Estimated Creatinine Clearance: 24.6 mL/min (A) (by C-G formula based on SCr of 1.87 mg/dL (H)). Liver Function Tests: Recent Labs  Lab 02/24/22 1650  AST 54*  ALT 26  ALKPHOS 132*  BILITOT 0.6  PROT 8.6*  ALBUMIN 3.3*   No results for input(s): "LIPASE", "AMYLASE" in the last 168 hours. Recent Labs  Lab 02/24/22 1907  AMMONIA 14   Coagulation Profile: No results for input(s): "INR", "PROTIME" in the last 168 hours. Cardiac Enzymes: No results for input(s): "CKTOTAL", "CKMB", "CKMBINDEX", "TROPONINI" in the last 168 hours. BNP (last 3 results) No results for input(s): "PROBNP" in the last 8760 hours. HbA1C: No results for input(s): "HGBA1C" in the last 72 hours. CBG: Recent Labs  Lab 02/24/22 1654  GLUCAP 167*   Lipid Profile: No results for input(s): "CHOL", "HDL", "LDLCALC", "TRIG", "CHOLHDL", "LDLDIRECT" in the last 72 hours. Thyroid Function Tests: No results for input(s): "TSH", "T4TOTAL", "FREET4", "T3FREE", "THYROIDAB" in the last 72 hours. Anemia Panel: No results for  input(s): "VITAMINB12", "FOLATE", "FERRITIN", "TIBC", "IRON", "RETICCTPCT" in the last 72 hours. Urine analysis:    Component Value Date/Time   COLORURINE STRAW (A) 02/24/2022 2117   APPEARANCEUR HAZY (A) 02/24/2022 2117   APPEARANCEUR Clear 10/07/2021 1507   LABSPEC 1.005 02/24/2022 2117   PHURINE 7.0 02/24/2022 2117   GLUCOSEU NEGATIVE 02/24/2022 2117   HGBUR SMALL (A) 02/24/2022 2117   BILIRUBINUR NEGATIVE 02/24/2022 2117   BILIRUBINUR Negative 10/07/2021 1[place]11/29/2023 2117   PROTEINUR 100 (A) 02/24/2022 2117   UROBILINOGEN 0.2 10/05/2013 0059   NITRITE NEGATIVE 02/24/2022 2117   LEUKOCYTESUR LARGE (A) 02/24/2022 2117    Radiological Exams on Admission: DG  Shoulder Left  Result Date: 02/24/2022 CLINICAL DATA:  Fall with left shoulder pain EXAM: LEFT SHOULDER - 2+ VIEW COMPARISON:  Shoulder radiographs 04/07/2021 FINDINGS: No definite acute fracture or dislocation. 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 Similar widening of the [facility] joint. IMPRESSION: No acute fracture or dislocation. Unchanged widening of the AC joint. Electronically Signed   By: Placido Sou M.D.   On: 02/24/2022 17:29   DG Chest 2 View  Result Date: 02/24/2022 CLINICAL DATA:  Chest pain and left shoulder pain EXAM: CHEST - 2 VIEW COMPARISON:  Non radiographs 08/29/2021 and CT 08/18/2021 FINDINGS: Stable cardiomediastinal silhouette. Calcified tortuous aorta. Right basilar atelectasis. Otherwise no focal consolidation. No pleural effusion or pneumothorax. Elevated right hemidiaphragm. No displaced rib fractures. IMPRESSION: No active cardiopulmonary disease.  Calcified tortuous aorta. Electronically Signed   By: Placido Sou M.D.   On: 02/24/2022 17:26   CT HEAD WO CONTRAST  Result Date: 02/24/2022 CLINICAL DATA:  Syncope/presyncope, cerebrovascular cause suspected EXAM: CT HEAD WITHOUT CONTRAST TECHNIQUE:  Contiguous axial images were obtained from the base of the skull through the vertex without intravenous contrast. RADIATION DOSE REDUCTION: This exam was performed according to the departmental dose-optimization program which includes automated exposure control, adjustment of the mA and/or kV according to patient size and/or use of iterative reconstruction technique. COMPARISON:  CT head 08/18/2021, MRI head 08/18/2021 FINDINGS: Brain: Patchy and confluent areas of decreased attenuation are noted throughout the deep and periventricular white matter of the cerebral hemispheres bilaterally, compatible with chronic microvascular ischemic disease. No evidence of large-territorial acute infarction. No parenchymal hemorrhage. No mass lesion. No extra-axial collection. No mass effect or midline shift. No hydrocephalus. Basilar cisterns are patent. Vascular: No hyperdense vessel. Atherosclerotic calcifications are present within the cavernous internal carotid and vertebral arteries. Skull: No acute fracture or focal lesion. Sinuses/Orbits: Paranasal sinuses and mastoid air cells are clear. Bilateral lens replacement otherwise the orbits are unremarkable. Other: None. IMPRESSION: No acute intracranial abnormality. Electronically Signed   By: Iven Finn M.D.   On: 02/24/2022 17:20   CT Cervical Spine Wo Contrast  Result Date: 02/24/2022 CLINICAL DATA:  Neck trauma, midline tenderness (Age 12-64y) EXAM: CT CERVICAL SPINE WITHOUT CONTRAST TECHNIQUE: Multidetector CT imaging of the cervical spine was performed without intravenous contrast. Multiplanar CT image reconstructions were also generated. RADIATION DOSE REDUCTION: This exam was performed according to the departmental dose-optimization program which includes automated exposure control, adjustment of the mA and/or kV according to patient size and/or use of iterative reconstruction technique. COMPARISON:  None Available. FINDINGS: Alignment: Straightening of the  cervical lordosis with slight reversal. There is degenerative grade 1 anterolisthesis at C4-C5, C5-C6, and C7-T1. Skull base and vertebrae: There is no acute cervical spine fracture. Soft tissues and spinal canal: No prevertebral fluid or swelling. No visible canal hematoma. Disc levels: There is multilevel degenerative disc disease, moderate-severe at C5-C6, C6-C7, and C7-T1. There is moderate-severe multilevel facet arthropathy. Upper chest: Negative. Other: There is a subcentimeter left thyroid nodule which requires no follow-up imaging. IMPRESSION: No acute cervical spine fracture. Multilevel degenerative disc disease and facet arthropathy. Electronically Signed   By: Maurine Simmering M.D.   On: 02/24/2022 17:18      Assessment/Plan   Principal Problem:   Syncope Active Problems:   HLD (hyperlipidemia)   Acute hyponatremia   Chronic diastolic CHF (congestive heart failure) (HCC)   DM2 (diabetes mellitus, type 2) (HCC)   Acute renal failure superimposed  on stage 3b chronic kidney disease (HCC)   Hypercalcemia   Leukocytosis   GAD (generalized anxiety disorder)      #) Syncope: 2 episode of syncope that occurred over last 3 days when the patient was moving from a seated to standing position and associated with prodrome, suggestive of potential orthostatic hypotension in the setting of dehydration given clinical evidence of such in the setting of her home diuretic regimen, including elevated bicarbonate, consistent with contraction alkalosis in the setting, as well as interval increase in white blood cell count, hemoglobin, and platelet count relative to last month, with this pain elevation also consistent with hemoconcentration as a result of underlying relative dehydration.   Her risk for orthostatic hypotension in the setting of dehydration is further exacerbated by home pharmacologic factors serving to diminish compensatory tachycardic response in the setting of home beta-blocker.   Additionally, daughter conveys recent initiation of amitriptyline, which can also be associated with increased risk for orthostatic hypotension.  Will check orthostatic vital signs, following which we will plan to initiate gentle IV fluids for intravascular repletion.  Not associated with any overt acute focal neurologic deficits. Clinically, acute ischemic stroke versus seizures appear less likely at this time. Presentation appears less consistent with ACS at this time, with serial troponin found to be nonelevated thus far, presenting EKG showing no evidence of acute ischemic changes.  However, his doctor felt that the patient was clutching at the left side of her chest just prior to both episodes of syncope, we will continue to trend troponin and pursue echocardiogram in the morning.  In setting of associated prodrome, the possibility of ventricular arrhythmia appears to be less likely at this time, although will closely monitor on telemetry for any e/o potential contributory arrhythmia, while also assessing serum Mg level.   Given a prior history of syncope in the setting of acute pulmonary embolism and November 2021, and given question of potential associated chest pain associated with both episodes of syncope over the last 3 days, with D-dimer elevated, will pursue further evaluation acute pulmonary embolism in the form of VQ scan given the patient's renal function.   Differential also includes potential contribution from autonomic dysfunction in the setting of underlying diabetes with documented history of peripheral polyneuropathy.   Plan: I have placed a nursing communication order requesting that orthostatic vital signs x 1 set be checked and documented, following which will initiate gentle IVF's in the form of LR at 50 cc/h x 6 hours.. Monitor on telemetry.  Hold home amitriptyline for now.  Monitor strict I's and O's.  Add-on serum Mg level. Check CMP, CBC, serum Mg level in the AM. Fall  precautions ordered. Trend trop.  Echocardiogram, VQ scan, venous ultrasound of bilateral lower extremities.          #) Acute on chronic hypoosmolar hyponatremia: Relative to baseline sodium range of 1 32-1 37, her presenting serum sodium is slightly lower, as further quantified above, with suspected hypovolemic contribution from increased renal losses via home diuretic regimen, namely metolazone, given concomitant interval decline in serum chloride level.  No evidence of acute volume overload.  No evidence of acute volume overload, including BNP that has trended down in the interval since last month as well as chest x-Murtha shows no evidence of acute process.  Potential additional pharmacologic contribution from recently initiated amitriptyline.  Plan: Hold home diuretic medications this evening, while providing gentle IV fluids, as outlined above.  Monitor strict I's and O's and daily  weights.  Check urinalysis.  Check random urine sodium as well as random urine creatinine.  Check serum and urine osmolality.  Repeat CMP in the morning.  Hold amitriptyline for now.             #) Acute Kidney Injury superimposed on CKD 3B:  as quantified above.  Suspect that this is prerenal in nature in the context of dehydration, as further outlined above.  Urinalysis with microscopy result currently pending.  We will hold home diuretic medications and provide gentle IV fluids this evening, with close monitoring of ensuing renal response to these measures, as further detailed below.   Plan: monitor strict I's & O's and daily weights. Attempt to avoid nephrotoxic agents.  Hold home gabapentin for now.  Refrain from NSAIDs. Repeat CMP in the morning. Check serum magnesium level.  Check urinalysis with microscopy.  Add-on random urine sodium and random urine creatinine.  Gentle IV fluids, as above.           Lawson Fiscal            #) Hypercalcemia: Presenting labs reflect adjusted serum  calcium of 10.5.  Suspect element of dehydration, as further detailed above, with potential additional pharmacologic contribution from home Metolazone. Will home next doses of home diuretic medications and initiate gentle IVF's, as above, with repeat calcium level in the morning, with consideration for further expansion of work-up if no ensuing improvement in serum calcium level following interval IVF administration.     Plan: LR, as above.  Monitor strict I's&O's, daily weights.  CMP in the morning.  Check serum Mg and Phos levels. Hold home Metolazone and bumex for now.               #) Leukocytosis: Presenting CBC reflects mildly elevated white cell count of 10,600. Suspect an element of hemoconcentration in the setting of dehydration, as described above, consistent with interval increase in the hemoglobin and platelet count as well, consistent with hemoconcentration. This may also be reactive in nature in the setting of syncope with fall x2 over the last 3 days. No evidence to suggest underlying infectious process at this time, including chest x-Denn which shows no evidence of acute cardiopulmonary process.  Urinalysis result currently pending.  In the absence of any overt underlying infectious process, criteria for sepsis not currently met.  She appears hemodynamically stable.  Therefore we will refrain from initiation of antibiotics at this time, will follow closely for ensuing urinalysis result.  Plan: Repeat CBC with diff in the morning.  Monitor strict I's and O's, daily weights.  Check urinalysis.  Gentle IV fluids, as above.              #) Chronic diastolic heart failure: documented history of such, with most recent echocardiogram performed in May 2023 notable for LVEF 55 to 60% as well as grade 1 diastolic dysfunction with additional results as conveyed above. No clinical, radiographic, or laboratory evidence to suggest acutely decompensated heart failure at this time,  rather the patient appears slightly intravascularly depleted, as further detailed above. home diuretic regimen reportedly consists of the following: Bumex and metolazone.    Plan: monitor strict I's & O's and daily weights. Repeat CMP in AM. Check serum mag level.  Holding next doses of home diuretic regimen, as further detailed above.  We will follow for result of echocardiogram, which has been ordered as component of evaluation of presenting syncope potentially associated with chest pain.                #)  Type 2 Diabetes Mellitus: documented history of such. Home insulin regimen: Sliding scale regular insulin, 3 times daily with meals.. Home oral hypoglycemic agents: Januvia, glipizide. presenting blood sugar: 161. Most recent A1c noted to be 9.3% in May 2023 in the setting of presenting syncope, will recheck hemoglobin A1c, and closely monitor ensuing blood sugars via Accu-Cheks as outlined below.  Her diabetes is complicated by peripheral polyneuropathy, for which she is on gabapentin.   Plan: accuchecks QAC and HS with low dose SSI.  Hold home regular insulin.  Hold home oral hypoglycemic agents during this hospitalization.  Add on hemoglobin A1c.  Hold gabapentin for now in the setting of presenting acute kidney injury superimposed on CKD 3B.           #) Hyperlipidemia: documented h/o such. On high intensity rosuvastatin as outpatient.   Plan: continue home statin.             #) Generalized anxiety disorder: On prn Xanax as an outpatient.  Daughter present at bedside confirms patient's mental status at baseline.  Plan: Continue home prn Xanax.     DVT prophylaxis: SCD's  + continue home Eliquis Code Status: Full code Family Communication: I discussed the patient's case with her daughter, who is present at bedside, as further detailed above Disposition Plan: Per Rounding Team Consults called: none;  Admission status: Observation     I SPENT  GREATER THAN 75  MINUTES IN CLINICAL CARE TIME/MEDICAL DECISION-MAKING IN COMPLETING THIS ADMISSION.      [place] DO [facility]  From [place]   02/24/2022, 9:48 PM

## 2022-02-24 NOTE — ED Notes (Signed)
Can't give urine at this time

## 2022-02-24 NOTE — ED Triage Notes (Signed)
Patient sent from [facility] after daughter brought her for 3 syncopal episodes today.  A few days ago patient had blood sugars >500.  Patient has not complaints at this time  CBG 208

## 2022-02-24 NOTE — ED Notes (Signed)
Daughter at bedside. Daughter states she knows of 2 syncopal episodes. States that she notices her mother having chest pain occasionally. Patient lives at home with husband.

## 2022-02-24 NOTE — ED Provider Notes (Signed)
[place] EMERGENCY DEPARTMENT Provider Note   CSN: [id number] Arrival date & time: 02/24/22  1616    History  Chief Complaint  Patient presents with   Loss of Consciousness    [name] is a 81 y.o. female history of CAD, hypertension, CKD, CHF, diabetes, dementia here for evaluation of syncope.  Brought in from [place] PCP office.  Apparently had 3 syncopal episodes over the last few days per EMS. Sounds like she has been having some hyperglycemia greater than 500.  Told triage provider that she thinks she only passed out once.  States she feels lightheaded before she passes out and then states she does not remember if she has hit her head. She states she has memory issues. Some intermittent CP prior to syncope.   Daughter here helps to provide collateral information.  Patient lives with her husband.  Has had 2 syncopal episodes over the last few days.  Patient complains of chest pain, shortness of breath, tingling all over and subsequently has a syncopal episode. No post ictal period. Family has noted Bl LE edema however has some chronically. Patient with hx of DVT. Compliant with meds per daughter, Eliquis   Daughter states patient at baseline mentation. UA at PCP today with protein and blood. Does have hx of UTI.   HPI     Home Medications Prior to Admission medications   Medication Sig Start Date End Date Taking? Authorizing Provider  acetaminophen-codeine (TYLENOL #3) 300-30 MG tablet Take 1 tablet by mouth every 4 (four) hours as needed for moderate pain.    [provider]  ALPRAZolam Duanne Moron) 0.25 MG tablet Take 1-2 tablets (0.25-0.5 mg total) by mouth 2 (two) times daily as needed for anxiety. 04/17/21   Truitt Merle, MD  amitriptyline (ELAVIL) 25 MG tablet Take 1 tablet (25 mg total) by mouth at bedtime. 08/20/21   Ghimire, Henreitta Leber, MD  Biotin 10000 MCG TABS Take 1 tablet by mouth every evening.    [provider]  Blood  Glucose Monitoring Suppl (FREESTYLE LITE) w/Device KIT use as directed 07/21/20   Kayleen Memos, DO  bumetanide (BUMEX) 2 MG tablet Take 4 mg by mouth daily.    [provider]  Cholecalciferol (VITAMIN D3) 50 MCG (2000 UT) capsule Take 2,000 Units by mouth every evening.    [provider]  Coenzyme Q10 (COQ10 PO) Take 1 capsule by mouth every evening.    [provider]  COLCRYS 0.6 MG tablet TAKE 1 TABLET DAILY AS NEEDED FOR GOUT FLARE. 12/27/18   Newt Minion, MD  CRANBERRY PO Take 4,200 mg by mouth every evening.    [provider]  ELIQUIS 5 MG TABS tablet Take 5 mg by mouth 2 (two) times daily. 07/13/21   [provider]  febuxostat (ULORIC) 40 MG tablet Take 40 mg by mouth daily. 12/31/19   [provider]  gabapentin (NEURONTIN) 300 MG capsule Take 300 mg by mouth at bedtime. 12/18/19   [provider]  glipiZIDE (GLUCOTROL XL) 2.5 MG 24 hr tablet Take 2.5 mg by mouth daily with breakfast.    [provider]  glucose blood (FREESTYLE LITE) test strip Test blood sugars up to 4 times daily 07/21/20   Kayleen Memos, DO  insulin glargine (LANTUS) 100 UNIT/ML Solostar Pen Inject 5 Units into the skin daily. Patient not taking: Reported on 10/06/2021 07/21/20   Kayleen Memos, DO  Insulin Pen Needle (PENTIPS) 32G  X 4 MM MISC Use as directed 07/21/20   Kayleen Memos, DO  Insulin Pen Needle 32G X 4 MM MISC 5 Units by Does not apply route in the morning and at bedtime. 07/21/20   Kayleen Memos, DO  insulin regular (NOVOLIN R) 100 units/mL injection Inject into the skin 3 (three) times daily before meals. Started last week 4/28: Sliding scale before meals: <150 none,                                                                           151-199 - 2 units                                                                            200-249 - 4 units                                                                            250-299 -  6 units                                                                            300-349 - 8 units                                                                            350> 10 units    [provider]  JANUVIA 25 MG tablet Take 25 mg by mouth daily. 12/26/19   [provider]  Javier Docker Oil 500 MG CAPS Take 500 mg by mouth daily.    [provider]  Lancets (FREESTYLE) lancets Test blood sugars up to 4 times daily 07/21/20   Kayleen Memos, DO  memantine [facility] TITRATION PAK) tablet pack 5 mg/day for =1 week; 5 mg twice daily for =1 week; 15 mg/day given in 5 mg and 10 mg separated doses for =1 week; then 10 mg twice daily 10/06/21   Garvin Fila, MD  memantine (NAMENDA) 10 MG tablet Take 1 tablet (10 mg total) by mouth 2 (two) times daily. 10/06/21   Garvin Fila, MD  Methylfol-Algae-B12-Acetylcyst (CEREFOLIN NAC PO)  Take 1 tablet by mouth in the morning.    [provider]  metolazone (ZAROXOLYN) 2.5 MG tablet Take 2.5 mg by mouth daily. 09/21/21   [provider]  metoprolol succinate (TOPROL-XL) 25 MG 24 hr tablet Take 25 mg by mouth at bedtime.  12/27/19   [provider]  Multiple Vitamin (MULTIVITAMIN WITH MINERALS) TABS tablet Take 1 tablet by mouth daily. 03/03/20   Terrilee Croak, MD  nitroGLYCERIN (NITROLINGUAL) 0.4 MG/SPRAY spray Place 1 spray under the tongue every 5 (five) minutes x 3 doses as needed for chest pain.    [provider]  pantoprazole (PROTONIX) 40 MG tablet Take 1 tablet (40 mg total) by mouth 2 (two) times daily before a meal. 03/02/20   Dahal, Marlowe Aschoff, MD  potassium chloride SA (KLOR-CON M) 20 MEQ tablet Take 20 mEq by mouth daily.    [provider]  pramipexole (MIRAPEX) 0.25 MG tablet TAKE 1 TO 2 TABLETS AS INSTRUCTED, 1 TABLET AT 4:00 P.M. AND 2 TABLETS AT BEDTIME 09/27/19   Frann Rider, NP  rosuvastatin (CRESTOR) 20 MG tablet Take 20 mg by mouth daily.    [provider]  UNABLE TO  FIND Take 1 tablet by mouth daily. Med Name: Resveratrol 5110m    [provider]      Allergies    Nsaids, Reglan [metoclopramide], Ultram [tramadol], Lipitor [atorvastatin], Lyrica [pregabalin], and Urecholine [bethanechol]    Review of Systems   Review of Systems  Unable to perform ROS: Dementia  All other systems reviewed and are negative.   Physical Exam Updated Vital Signs BP (!) 151/100   Pulse 92   Temp 97.6 F (36.4 C) (Oral)   Resp 18   Ht 5' 6" (1.676 m)   Wt 82.1 kg   SpO2 97%   BMI 29.21 kg/m  Physical Exam Vitals and nursing note reviewed.  Constitutional:      General: She is not in acute distress.    Appearance: She is well-developed. She is not ill-appearing, toxic-appearing or diaphoretic.  HENT:     Head: Normocephalic and atraumatic.     Nose: Nose normal.     Mouth/Throat:     Mouth: Mucous membranes are moist.  Eyes:     Pupils: Pupils are equal, round, and reactive to light.  Cardiovascular:     Rate and Rhythm: Normal rate.     Pulses: Normal pulses.     Heart sounds: Normal heart sounds.  Pulmonary:     Effort: Pulmonary effort is normal. No respiratory distress.     Breath sounds: Normal breath sounds.  Abdominal:     General: Bowel sounds are normal. There is no distension.     Palpations: Abdomen is soft.     Tenderness: There is no abdominal tenderness. There is no right CVA tenderness, left CVA tenderness, guarding or rebound.  Musculoskeletal:        General: No swelling, tenderness, deformity or signs of injury. Normal range of motion.     Cervical back: Normal range of motion.     Right lower leg: Edema present.     Left lower leg: Edema present.  Skin:    General: Skin is warm and dry.     Capillary Refill: Capillary refill takes less than 2 seconds.  Neurological:     Mental Status: She is alert.     Comments: Pleasantly confused.  States location is [employer] alert to person, date of birth, unable to tell the  year Moves all 4 extremities without difficulty No facial droop, tongue midline Equal hand grip BIL  Psychiatric:        Mood and Affect: Mood normal.    ED Results / Procedures / Treatments   Labs (all labs ordered are listed, but only abnormal results are displayed) Labs Reviewed  COMPREHENSIVE METABOLIC PANEL - Abnormal; Notable for the following components:      Result Value   Sodium 129 (*)    Chloride 82 (*)    CO2 34 (*)    Glucose, Bld 161 (*)    Creatinine, Ser 1.87 (*)    Total Protein 8.6 (*)    Albumin 3.3 (*)    AST 54 (*)    Alkaline Phosphatase 132 (*)    GFR, Estimated 26 (*)    All other components within normal limits  CBC WITH DIFFERENTIAL/PLATELET - Abnormal; Notable for the following components:   WBC 10.6 (*)    All other components within normal limits  D-DIMER, QUANTITATIVE - Abnormal; Notable for the following components:   D-Dimer, Quant 1.06 (*)    All other components within normal limits  CBG MONITORING, ED - Abnormal; Notable for the following components:   Glucose-Capillary 167 (*)    All other components within normal limits  BRAIN NATRIURETIC PEPTIDE  AMMONIA  URINALYSIS, ROUTINE W REFLEX MICROSCOPIC  OSMOLALITY  CBC WITH DIFFERENTIAL/PLATELET  COMPREHENSIVE METABOLIC PANEL  MAGNESIUM  MAGNESIUM  TROPONIN I (HIGH SENSITIVITY)  TROPONIN I (HIGH SENSITIVITY)    EKG EKG Interpretation  Date/Time:  Wednesday February 24 2022 16:32:52 EST Ventricular Rate:  93 PR Interval:  168 QRS Duration: 94 QT Interval:  372 QTC Calculation: 462 R Axis:   36 Text Interpretation: Normal sinus rhythm Possible Left atrial enlargement Nonspecific ST abnormality Abnormal ECG When compared with ECG of 29-Aug-2021 10:21, PREVIOUS ECG IS PRESENT Confirmed by Lennice Sites ([id number]) on 02/24/2022 4:40:05 PM  Radiology DG Shoulder Left  Result Date: 02/24/2022 CLINICAL DATA:  Fall with left shoulder pain EXAM: LEFT SHOULDER - 2+ VIEW COMPARISON:   Shoulder radiographs 04/07/2021 FINDINGS: No definite acute fracture or dislocation. 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 Similar widening of the [facility] joint. IMPRESSION: No acute fracture or dislocation. Unchanged widening of the AC joint. Electronically Signed   By: Placido Sou M.D.   On: 02/24/2022 17:29   DG Chest 2 View  Result Date: 02/24/2022 CLINICAL DATA:  Chest pain and left shoulder pain EXAM: CHEST - 2 VIEW COMPARISON:  Non radiographs 08/29/2021 and CT 08/18/2021 FINDINGS: Stable cardiomediastinal silhouette. Calcified tortuous aorta. Right basilar atelectasis. Otherwise no focal consolidation. No pleural effusion or pneumothorax. Elevated right hemidiaphragm. No displaced rib fractures. IMPRESSION: No active cardiopulmonary disease.  Calcified tortuous aorta. Electronically Signed   By: Placido Sou M.D.   On: 02/24/2022 17:26   CT HEAD WO CONTRAST  Result Date: 02/24/2022 CLINICAL DATA:  Syncope/presyncope, cerebrovascular cause suspected EXAM: CT HEAD WITHOUT CONTRAST TECHNIQUE: Contiguous axial images were obtained from the base of the skull through the vertex without intravenous contrast. RADIATION DOSE REDUCTION: This exam was performed according to the departmental dose-optimization program which includes automated exposure control, adjustment of the mA and/or kV according to patient size and/or use of iterative reconstruction technique. COMPARISON:  CT head 08/18/2021, MRI head 08/18/2021 FINDINGS: Brain: Patchy and confluent areas of decreased attenuation are noted throughout the deep and periventricular white matter of the cerebral hemispheres bilaterally, compatible with  chronic microvascular ischemic disease. No evidence of large-territorial acute infarction. No parenchymal hemorrhage. No mass lesion. No extra-axial collection. No mass effect or midline shift. No hydrocephalus. Basilar cisterns are  patent. Vascular: No hyperdense vessel. Atherosclerotic calcifications are present within the cavernous internal carotid and vertebral arteries. Skull: No acute fracture or focal lesion. Sinuses/Orbits: Paranasal sinuses and mastoid air cells are clear. Bilateral lens replacement otherwise the orbits are unremarkable. Other: None. IMPRESSION: No acute intracranial abnormality. Electronically Signed   By: Iven Finn M.D.   On: 02/24/2022 17:20   CT Cervical Spine Wo Contrast  Result Date: 02/24/2022 CLINICAL DATA:  Neck trauma, midline tenderness (Age 66-64y) EXAM: CT CERVICAL SPINE WITHOUT CONTRAST TECHNIQUE: Multidetector CT imaging of the cervical spine was performed without intravenous contrast. Multiplanar CT image reconstructions were also generated. RADIATION DOSE REDUCTION: This exam was performed according to the departmental dose-optimization program which includes automated exposure control, adjustment of the mA and/or kV according to patient size and/or use of iterative reconstruction technique. COMPARISON:  None Available. FINDINGS: Alignment: Straightening of the cervical lordosis with slight reversal. There is degenerative grade 1 anterolisthesis at C4-C5, C5-C6, and C7-T1. Skull base and vertebrae: There is no acute cervical spine fracture. Soft tissues and spinal canal: No prevertebral fluid or swelling. No visible canal hematoma. Disc levels: There is multilevel degenerative disc disease, moderate-severe at C5-C6, C6-C7, and C7-T1. There is moderate-severe multilevel facet arthropathy. Upper chest: Negative. Other: There is a subcentimeter left thyroid nodule which requires no follow-up imaging. IMPRESSION: No acute cervical spine fracture. Multilevel degenerative disc disease and facet arthropathy. Electronically Signed   By: Maurine Simmering M.D.   On: 02/24/2022 17:18    Procedures Procedures    Medications Ordered in ED Medications  acetaminophen (TYLENOL) tablet 650 mg (has no  administration in time range)    Or  acetaminophen (TYLENOL) suppository 650 mg (has no administration in time range)  melatonin tablet 3 mg (has no administration in time range)    ED Course/ Medical Decision Making/ A&P    81 year old with history of CHF, CKD, Alzheimer's, CAD, PE here for evaluation of syncope.  Daughter states patient typically complains of chest pain, shortness of breath, tingling to her extremities and has syncopal episode.  Has had 2 episodes over the last few days.  She been doing otherwise well prior to this.  Patient with history of Alzheimer's does not provide much as far as her history is concerned however does remember having syncopal episodes and dizziness.  She is not entirely sure surrounding the episodes.  Daughter states she had similar occurrences with prior VTE's.  She is currently anticoagulated on Eliquis, has not missed any doses.  She is at her baseline mentation.  Labs and imaging personally viewed and interpreted:  CT head without significant normality CT cervical without significant underlying Chest x-[place] without cardiomegaly, pulm edema, pneumothorax X-Conradt left shoulder without fracture, dislocation EKG that ischemic changes CBC leukocytosis 75.10 metabolic panel sodium 258, creatinine 1.87, slightly up from baseline Troponin 10 Ammonia 14 D-dimer 1.06>> not CTA due to GFR.  Will need VQ scan.  Ultrasound not available for lower extremities at this time.  Patient with chest pain, recurrent syncope.  Will need further workup for evaluation and management.  Family agreeable.  CONSULT with Dr. Velia Meyer with [facility] who is agreeable to evaluate patient for admission.  The patient appears reasonably stabilized for admission considering the current resources, flow, and capabilities available in the ED at this time, and I  doubt any other EMC requiring further screening and/or treatment in the ED prior to admission.                            Medical  Decision Making Amount and/or Complexity of Data Reviewed Independent Historian:     Details: daughter External Data Reviewed: labs, radiology, ECG and notes. Labs: ordered. Decision-making details documented in ED Course. Radiology: ordered and independent interpretation performed. Decision-making details documented in ED Course. ECG/medicine tests: ordered and independent interpretation performed. Decision-making details documented in ED Course.  Risk OTC drugs. Prescription drug management. Parenteral controlled substances. Decision regarding hospitalization. Diagnosis or treatment significantly limited by social determinants of health.          Final Clinical Impression(s) / ED Diagnoses Final diagnoses:  Chest pain, unspecified type  Syncope, unspecified syncope type  AKI (acute kidney injury) [facility])    Rx / DC Orders ED Discharge Orders     None         ,  A, PA-C 02/24/22 2040    Leanord Asal K, DO 02/24/22 2256

## 2022-02-24 NOTE — ED Provider Triage Note (Signed)
Emergency Medicine Provider Triage Evaluation Note  [name] , a 81 y.o. female  was evaluated in triage.  Pt complains of syncopal episodes - EMS reports 3 episodes. Pt states shse only passed out once. She states she feels light headed before she passes out. She states "I don't remmeber" when asked if she hit her head. She states she has memory issues.    CAD s/p stents in the past. Hx CHFm, CKD, stroke.    At the moment she feels tired and a bit light headed but no pain.   Review of Systems  Positive: LH, syncope Negative: Fever   Physical Exam  BP (!) 132/91 (BP Location: Right Arm)   Pulse 98   Temp 97.8 F (36.6 C) (Oral)   Resp 16   Ht '5\' 6"'$  (1.676 m)   Wt 82.1 kg   SpO2 98%   BMI 29.21 kg/m  Gen:   Awake, no distress   Resp:  Normal effort  MSK:   Moves extremities without difficulty  Other:  Symmetric swollen BL legs.   Medical Decision Making  Medically screening exam initiated at 4:48 PM.  Appropriate orders placed.  [name] was informed that the remainder of the evaluation will be completed by another provider, this initial triage assessment does not replace that evaluation, and the importance of remaining in the ED until their evaluation is complete.  Labs, EKG, urine, CXR    Tedd Sias, [place] 02/24/22 1652

## 2022-02-25 ENCOUNTER — Observation Stay (HOSPITAL_BASED_OUTPATIENT_CLINIC_OR_DEPARTMENT_OTHER): Payer: Medicare Other

## 2022-02-25 DIAGNOSIS — L538 Other specified erythematous conditions: Secondary | ICD-10-CM | POA: Diagnosis not present

## 2022-02-25 DIAGNOSIS — N1832 Chronic kidney disease, stage 3b: Secondary | ICD-10-CM

## 2022-02-25 DIAGNOSIS — N170 Acute kidney failure with tubular necrosis: Secondary | ICD-10-CM

## 2022-02-25 DIAGNOSIS — R55 Syncope and collapse: Secondary | ICD-10-CM

## 2022-02-25 DIAGNOSIS — I5032 Chronic diastolic (congestive) heart failure: Secondary | ICD-10-CM

## 2022-02-25 DIAGNOSIS — R42 Dizziness and giddiness: Secondary | ICD-10-CM | POA: Diagnosis not present

## 2022-02-25 DIAGNOSIS — M7989 Other specified soft tissue disorders: Secondary | ICD-10-CM | POA: Diagnosis not present

## 2022-02-25 LAB — CBC WITH DIFFERENTIAL/PLATELET
Abs Immature Granulocytes: 0.03 10*3/uL (ref 0.00–0.07)
Basophils Absolute: 0.1 10*3/uL (ref 0.0–0.1)
Basophils Relative: 1 %
Eosinophils Absolute: 0.2 10*3/uL (ref 0.0–0.5)
Eosinophils Relative: 2 %
HCT: 36.1 % (ref 36.0–46.0)
Hemoglobin: 12.1 g/dL (ref 12.0–15.0)
Immature Granulocytes: 0 %
Lymphocytes Relative: 17 %
Lymphs Abs: 1.4 10*3/uL (ref 0.7–4.0)
MCH: 30.2 pg (ref 26.0–34.0)
MCHC: 33.5 g/dL (ref 30.0–36.0)
MCV: 90 fL (ref 80.0–100.0)
Monocytes Absolute: 0.6 10*3/uL (ref 0.1–1.0)
Monocytes Relative: 7 %
Neutro Abs: 5.8 10*3/uL (ref 1.7–7.7)
Neutrophils Relative %: 73 %
Platelets: 232 10*3/uL (ref 150–400)
RBC: 4.01 MIL/uL (ref 3.87–5.11)
RDW: 14 % (ref 11.5–15.5)
WBC: 8 10*3/uL (ref 4.0–10.5)
nRBC: 0 % (ref 0.0–0.2)

## 2022-02-25 LAB — COMPREHENSIVE METABOLIC PANEL
ALT: 19 U/L (ref 0–44)
AST: 62 U/L — ABNORMAL HIGH (ref 15–41)
Albumin: 2.4 g/dL — ABNORMAL LOW (ref 3.5–5.0)
Alkaline Phosphatase: 99 U/L (ref 38–126)
Anion gap: 12 (ref 5–15)
BUN: 20 mg/dL (ref 8–23)
CO2: 31 mmol/L (ref 22–32)
Calcium: 8.7 mg/dL — ABNORMAL LOW (ref 8.9–10.3)
Chloride: 87 mmol/L — ABNORMAL LOW (ref 98–111)
Creatinine, Ser: 1.84 mg/dL — ABNORMAL HIGH (ref 0.44–1.00)
GFR, Estimated: 27 mL/min — ABNORMAL LOW (ref >60)
Glucose, Bld: 181 mg/dL — ABNORMAL HIGH (ref 70–99)
Potassium: 4.1 mmol/L (ref 3.5–5.1)
Sodium: 130 mmol/L — ABNORMAL LOW (ref 135–145)
Total Bilirubin: 0.9 mg/dL (ref 0.3–1.2)
Total Protein: 6.5 g/dL (ref 6.5–8.1)

## 2022-02-25 LAB — ECHOCARDIOGRAM COMPLETE
AR max vel: 2.78 cm2
AV Area VTI: 2.62 cm2
AV Area mean vel: 2.75 cm2
AV Mean grad: 2 mmHg
AV Peak grad: 4.2 mmHg
Ao pk vel: 1.03 m/s
Area-P 1/2: 2.29 cm2
Height: 66 in
MV VTI: 1.93 cm2
S' Lateral: 3.2 cm
Weight: 2896 oz

## 2022-02-25 LAB — TSH: TSH: 2.586 u[IU]/mL (ref 0.350–4.500)

## 2022-02-25 LAB — OSMOLALITY, URINE: Osmolality, Ur: 236 mOsm/kg — ABNORMAL LOW (ref 300–900)

## 2022-02-25 LAB — PHOSPHORUS: Phosphorus: 3.6 mg/dL (ref 2.5–4.6)

## 2022-02-25 LAB — TROPONIN I (HIGH SENSITIVITY): Troponin I (High Sensitivity): 10 ng/L (ref <18)

## 2022-02-25 LAB — SODIUM, URINE, RANDOM: Sodium, Ur: 66 mmol/L

## 2022-02-25 LAB — HEMOGLOBIN A1C
Hgb A1c MFr Bld: 9.7 % — ABNORMAL HIGH (ref 4.8–5.6)
Mean Plasma Glucose: 232 mg/dL

## 2022-02-25 LAB — CREATININE, URINE, RANDOM: Creatinine, Urine: 26 mg/dL

## 2022-02-25 LAB — CBG MONITORING, ED
Glucose-Capillary: 244 mg/dL — ABNORMAL HIGH (ref 70–99)
Glucose-Capillary: 294 mg/dL — ABNORMAL HIGH (ref 70–99)

## 2022-02-25 LAB — MAGNESIUM: Magnesium: 1.6 mg/dL — ABNORMAL LOW (ref 1.7–2.4)

## 2022-02-25 MED ORDER — MAGNESIUM SULFATE 2 GM/50ML IV SOLN
2.0000 g | Freq: Once | INTRAVENOUS | Status: AC
  Administered 2022-02-25: 2 g via INTRAVENOUS
  Filled 2022-02-25: qty 50

## 2022-02-25 MED ORDER — BUMETANIDE 2 MG PO TABS: 2.0000 mg | ORAL_TABLET | Freq: Every day | ORAL | Status: AC

## 2022-02-25 MED ORDER — FLUTICASONE PROPIONATE 50 MCG/ACT NA SUSP: 1.0000 | Freq: Every day | NASAL | 0 refills | Status: AC

## 2022-02-25 MED ORDER — LACTATED RINGERS IV SOLN: INTRAVENOUS | Status: DC

## 2022-02-25 MED ORDER — FLUTICASONE PROPIONATE 50 MCG/ACT NA SUSP
1.0000 | Freq: Every day | NASAL | Status: DC
  Filled 2022-02-25: qty 16

## 2022-02-25 NOTE — Progress Notes (Deleted)
[place]   [phone] [phone]   Clinic Follow up Note   Patient Care Team: Garvin Fila, MD as PCP - General (Neurology) Leonie Man, MD as PCP - Cardiology (Cardiology) Elmarie Shiley, MD as Consulting Physician (Nephrology) Leonie Man, MD as Consulting Physician (Cardiology) Alphonsa Overall, MD as Consulting Physician (General Surgery) Kyung Rudd, MD as Consulting Physician (Radiation Oncology) Truitt Merle, MD as Consulting Physician (Hematology) Delice Bison Charlestine Massed, NP as Nurse Practitioner (Hematology and Oncology)  Date of Service:  02/25/2022  CHIEF COMPLAINT: f/u of hospital visit, bilateral invasive breast cancer    CURRENT THERAPY:  Adjuvant Exemestane started on 03/07/17   ASSESSMENT: *** [name] is a 81 y.o. female with   No problem-specific Assessment & Plan notes found for this encounter.  ***   PLAN:  SUMMARY OF ONCOLOGIC HISTORY: Oncology History Overview Note  Cancer Staging Malignant 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, estrogen receptor positive ([place]) Staging form: Breast, AJCC 8th Edition - Clinical: Stage IA (cT1c, cN0, cM0, G1, ER: Positive, PR: Positive, HER2: Negative) - Unsigned     Malignant 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, estrogen receptor positive ([place])  10/20/2016 Initial Biopsy   Diagnosis Breast, right, needle core biopsy, 11:30 o'clock - INVASIVE DUCTAL CARCINOMA,   10/20/2016 Initial Diagnosis   Malignant 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, estrogen receptor positive ([place])   10/20/2016 Receptors her2   Estrogen Receptor: 100%, POSITIVE, STRONG STAINING INTENSITY Progesterone Receptor: 100%, POSITIVE, STRONG STAINING INTENSITY Proliferation Marker Ki67: 5% HER2 NEGATIVE    10/20/2016 Mammogram   Diagnostic mammogram showed persistent distortion corresponds to a vaguely palpable irregular mass  and is suspicious for malignancy.Targeted ultrasound is performed, showing an irregular hypoechoic mass with posterior acoustic shadowing in the 11:30 o'clock location of the right breast 4 cm from the nipple which measures 0.9 x 0.9 x0.8 cm. There is associated internal vascularity. Evaluation of the axilla is negative for adenopathy.   11/01/2016 Imaging   B/l BREAST MRI: IMPRESSION: 1. There is a 1.5 cm biopsy proven malignancy in the upper slightly outer quadrant of the right breast. No additional sites of disease are identified.   2. There is an indeterminate 6 mm enhancing mass in the upper-outer left breast.   11/11/2016 Pathology Results   Diagnosis Breast, left, needle core biopsy, upper outer - INVASIVE DUCTAL CARCINOMA. - ATYPICAL LOBULAR HYPERPLASIA. - FIBROCYSTIC CHANGES AND PSEUDOANGIOMATOUS STROMAL HYPERPLASIA ([place]).   11/11/2016 Receptors her2    Estrogen Receptor: 100%, POSITIVE, STRONG STAINING INTENSITY Progesterone Receptor: 10%, POSITIVE, WEAK STAINING INTENSITY Proliferation Marker Ki67: 2% HER2 NEGATIVE    12/03/2016 Genetic Testing   Germline genetic testing was performed through [employer]'s Common Hereditary Cancers Panel + [employer]'s Melanoma Panel. This custom panel includes analysis of the following 51 genes: APC, ATM, AXIN2, BAP1, BARD1, BMPR1A, BRCA1, BRCA2, BRIP1, CDH1, CDK4, CDKN2A, CHEK2, CTNNA1, DICER1, EPCAM, GREM1, HOXB13, KIT, MEN1, MITF, MLH1, MSH2, MSH3, MSH6, MUTYH, NBN, NF1, NTHL1, PALB2, PDGFRA, PMS2, POLD1, POLE, POT1, PTEN, RAD50, RAD51C, RAD51D, RB1, SDHA, SDHB, SDHC, SDHD, SMAD4, SMARCA4, STK11, TP53, TSC1, TSC2, and VHL.   A variant of uncertain significance (VUS) was noted in BARD1.    12/14/2016 Miscellaneous   Oncotype Recurrence score of 16 10-year risk of distant recurrence with Tamoxifen alone is 10%   12/14/2016 Surgery   BILATERAL BREAST LUMPECTOMY WITH BILATERAL  RADIOACTIVE SEED AND BILATERAL AXILLARY  SENTINEL LYMPH NODE BIOPSY  ERAS PATHWAY by Dr. Lucia Gaskins on 12/14/16   12/14/2016 Pathology Results   Diagnosis 12/14/16 1. Breast, lumpectomy, Left w/seed - INVASIVE DUCTAL CARCINOMA, 1 CM. - ATYPICAL LOBULAR HYPERPLASIA ([place]). - PREVIOUS BIOPSY SITE. - INVASIVE CARCINOMA 0.2 CM FROM LATERAL MARGIN. - ATYPICA LOBULAR HYPERPLASIA LESS THAN 0.1 CM FROM LATERAL MARGIN. 2. Breast, lumpectomy, Right w/seed - INVASIVE DUCTAL CARCINOMA, 1.1 CM. - FIBROCYSTIC CHANGES. - PREVIOUS BIOPSY SITE. - INVASIVE CARCINOMA 1.1 CM FROM POSTERIOR MARGIN. 3. Lymph node, sentinel, biopsy, Right Axillary Contents - BENIGN FIBROADIPOSE TISSUE. - NO LYMPH NODE TISSUE OR MALIGNANCY. 4. Lymph node, sentinel, biopsy, Left Axillary - ONE BENIGN LYMPH NODE (0/1).    01/17/2017 - 02/11/2017 Radiation Therapy   1. The Left breast was treated to 42.5 Gy in 17 fractions of 2.5 Gy.  2. The Right breast was treated to 42.5 Gy in 17 fractions of 2.5 Gy.  3. The Right breast was boosted to 7.5 Gy in 3 fractions of 2.5 Gy.  4. The Left breast was boosted to 7.5 Gy in 3 fractions of 2.5 Gy.       02/2017 -  Anti-estrogen oral therapy   Exemestane daily      INTERVAL HISTORY: *** [name] is here for a follow up of hospital visit, bilateral invasive breast cancer  She was last seen by me on 04/17/2021 She presents to the clinic     All other systems were reviewed with the patient and are negative.  MEDICAL HISTORY:  Past Medical History:  Diagnosis Date   Acute blood loss anemia 02/19/2020   Acute renal failure superimposed on stage 4 chronic kidney disease ([place]) 02/21/2020   Acute respiratory failure with hypoxia ([place]) 02/21/2020   Anemia    have received iron infusions   Anxiety    Arthritis     osteoarthritis   Breast cancer ([place])    Breast cancer in female [facility])    Bilateral   CAD S/P percutaneous coronary angioplasty 2007; March 2011   [employer] 3.0 mm 20 mm postdilated 3.6 mm in early mid LAD; status post ISR  Cutting Balloon PTCA and March 11 along with PCI of distal mid lesion with a 3.0 mm 12 mm MultiLink vision BMS; the proximal stent causes jailing of SP1 and SP2 with ostial 70-80% lesions   Cancer ([place])    Melanoma, Squamous cell Carcinoma   Charcot's joint of foot, right    Charcot's joint of left foot    Chronic diastolic CHF (congestive heart failure), NYHA class 2 ([place]) 02/28/2018   Chronic kidney disease    stage 2    Closed fracture of neck of left femur ([place]) 02/08/2020   Coronary artery disease    Dementia (HCC)    mild   Diabetes mellitus type 2 with neurological manifestations ([place])    Diabetes mellitus without complication (HCC)    DVT (deep venous thrombosis) (HCC)    Dyslipidemia, goal LDL below 70    Foot pain, bilateral    Full dentures    Genetic testing 12/03/2016   Germline genetic testing was performed through [employer]'s Common Hereditary Cancers Panel + [employer]'s Melanoma Panel. This custom panel includes analysis of the following 51 genes: APC, ATM, AXIN2, BAP1, BARD1, BMPR1A, BRCA1, BRCA2, BRIP1, CDH1, CDK4, CDKN2A, CHEK2, CTNNA1, DICER1, EPCAM, GREM1, HOXB13, KIT, MEN1, MITF, MLH1, MSH2, MSH3, MSH6, MUTYH, NBN, NF1, NTHL1, PALB2, PDGFRA, PMS2, POLD1, POL   GERD (gastroesophageal reflux disease)    GIB (gastrointestinal bleeding)  Gout    Headache(784.0)    Hip fracture ([place]) 02/08/2020   History of hematuria    Followed by Dr. Gaynelle Arabian   Hypertension, essential, benign    Hypotension    Migraine    Mild cognitive impairment    Peripheral neuropathy    Personal history of radiation therapy    Post herpetic neuralgia    Pulmonary embolism (HCC)    Restless leg syndrome    Stroke ([place])    TIA (transient ischemic attack)    multiple in the past   TIA (transient ischemic attack)    Wears glasses     SURGICAL HISTORY: Past Surgical History:  Procedure Laterality Date   ABDOMINAL HYSTERECTOMY     ANKLE FUSION Right 02/22/2018   Procedure: RIGHT  SUBTALAR AND TALONAVICULAR FUSION;  Surgeon: Newt Minion, MD;  Location: [facility];  Service: Orthopedics;  Laterality: Right;   APPENDECTOMY     BIOPSY  02/19/2020   Procedure: BIOPSY;  Surgeon: Otis Brace, MD;  Location: [facility];  Service: Gastroenterology;;   BLADDER SUSPENSION     BREAST LUMPECTOMY Bilateral 2018   BREAST LUMPECTOMY WITH RADIOACTIVE SEED AND SENTINEL LYMPH NODE BIOPSY Bilateral 12/14/2016   Procedure: BILATERAL BREAST LUMPECTOMY WITH BILATERAL  RADIOACTIVE SEED AND BILATERAL AXILLARY  SENTINEL LYMPH NODE BIOPSY ERAS PATHWAY;  Surgeon: Alphonsa Overall, MD;  Location: [facility];  Service: General;  Laterality: Bilateral;  [place]  02/24/2006   85% stenosis in the proximal portion of LAD-arrangements made for PCI on 02/25/2006   CARDIAC CATHETERIZATION  02/25/2006   80% LAD lesion stented with a 3x88m Liberte stent resulting in reduction of 80% lesion to 0% residual   CARDIAC CATHETERIZATION  04/26/2006   Medical management   CARDIAC CATHETERIZATION  06/24/2009   60-70% re-stenosis in the proximal LAD. A 3.25x15 cutting balloon, 3 inflations - 14atm-38sec, 13atm-39sec, and 12atm-40sec reduced to less than 10%. 60% stenosis of the mid/distal LAD stented with a 3x136mMultilink stent.   CARDIAC CATHETERIZATION  07/09/2009   Medical management   CARDIAC CATHETERIZATION     CAROTID DOPPLER  06/24/2009   40-59% R ICA stenosis. No significant ICA stenosis noted   CHOLECYSTECTOMY     CORONARY ANGIOPLASTY     DILATION AND CURETTAGE OF UTERUS     ESOPHAGOGASTRODUODENOSCOPY N/A 02/26/2020   Procedure: ESOPHAGOGASTRODUODENOSCOPY (EGD);  Surgeon: MaClarene EssexMD;  [facility] Service: Endoscopy;  Laterality: N/A;   ESOPHAGOGASTRODUODENOSCOPY (EGD) WITH PROPOFOL N/A 02/19/2020   Procedure: ESOPHAGOGASTRODUODENOSCOPY (EGD) WITH PROPOFOL;  Surgeon: BrOtis BraceMD;  [place] [facility] Service: Gastroenterology;  Laterality: N/A;    ESOPHAGOGASTRODUODENOSCOPY (EGD) WITH PROPOFOL Left 02/23/2020   Procedure: ESOPHAGOGASTRODUODENOSCOPY (EGD) WITH PROPOFOL;  Surgeon: OuArta SilenceMD;  [place] [facility] Service: Endoscopy;  Laterality: Left;   HIP ARTHROPLASTY Left 02/10/2020   Procedure: ARTHROPLASTY BIPOLAR HIP (HEMIARTHROPLASTY) POSTERIOR LATERAL;  Surgeon: MuRenette ButtersMD;  [place] [facility] Service: Orthopedics;  Laterality: Left;   JOINT REPLACEMENT Left    LESION REMOVAL Right 03/11/2015   Procedure:  EXCISION OF RIGHT PRE TIBIAL LESION;  Surgeon: JaJudeth HornMD;  [place] Service: General;  Laterality: Right;   MASS EXCISION Left 06/10/2014   Procedure: EXCISION LEFT LOWER LEG LESION;  Surgeon: JaDoreen SalvageMD;  [place] Service: General;  Laterality: Left;   MASS EXCISION Left 09/05/2015   Procedure: EXCISION OF LEFT FOREARM  MASS;  Surgeon: Judeth Horn, MD;  Location: [facility];  Service: General;  Laterality: Left;   NM MYOVIEW LTD  10/2011   No ischemia or infarction.  Normal EF.  LOW RISK. (Short bursts of 5 beats NSVT during recovery otherwise normal)   POLYPECTOMY  02/19/2020   Procedure: POLYPECTOMY;  Surgeon: Otis Brace, MD;  Location: [facility];  Service: Gastroenterology;;   SHOULDER ARTHROSCOPY  10/15   SHOULDER ARTHROSCOPY  1995   left   TEE WITHOUT CARDIOVERSION N/A 08/03/2017   Procedure: TRANSESOPHAGEAL ECHOCARDIOGRAM (TEE);  Surgeon: Skeet Latch, MD;  Location: [facility];  Service: Cardiovascular;; EF 60-65% with no wall motion normalities.  No atrial thrombus noted.  Lightly findings consistent with a lipomatous hypertrophy of the atrial septum.    TENDON REPAIR Left 05/12/2016   Procedure: Left Anterior Tibial Tendon Reconstruction;  Surgeon: Newt Minion, MD;  Location: [place];  Service: Orthopedics;  Laterality: Left;   TOTAL KNEE ARTHROPLASTY  2005   left   TRANSESOPHAGEAL ECHOCARDIOGRAM   08/03/2016   : EF 60-65% no wall motion normalities.  No atrial thrombus.  Lipomatous hypertrophy of the atrial septum.  No mass noted.   TRANSESOPHAGEAL ECHOCARDIOGRAM  07/2017   EF 60 to 65%.  No R WMA.  No atrial thrombus noted.  Lipomatous IAS.   TRANSTHORACIC ECHOCARDIOGRAM  02/25/2017   :EF 65-70%.  GR 1 DD.  No or WMA.  Moderate aortic calcification/sclerosis but no stenosis.   TRANSTHORACIC ECHOCARDIOGRAM  07/2017   Vigorous LV function.  EF 65-70%.  Mild diastolic dysfunction with no RWMA. GR 1 DD.  Mild aortic sclerosis/no stenosis.  Questionable right atrial mass discounted with TEE)    WRIST ARTHROPLASTY  2010   cancer lt wrist    I have reviewed the social history and family history with the patient and they are unchanged from previous note.  ALLERGIES:  is allergic to nsaids, reglan [metoclopramide], ultram [tramadol], lipitor [atorvastatin], lyrica [pregabalin], and urecholine [bethanechol].  MEDICATIONS:  Current Outpatient Medications  Medication Sig Dispense Refill   ALPRAZolam (XANAX) 0.25 MG tablet Take 1-2 tablets (0.25-0.5 mg total) by mouth 2 (two) times daily as needed for anxiety. 20 tablet 0   amitriptyline (ELAVIL) 25 MG tablet Take 1 tablet (25 mg total) by mouth at bedtime. 30 tablet 2   Biotin 10000 MCG TABS Take 1 tablet by mouth every evening.     Blood Glucose Monitoring Suppl (FREESTYLE LITE) w/Device KIT use as directed 1 kit 0   bumetanide (BUMEX) 2 MG tablet Take 1 tablet (2 mg total) by mouth daily.     Cholecalciferol (VITAMIN D3) 50 MCG (2000 UT) capsule Take 2,000 Units by mouth every evening.     Coenzyme Q10 (COQ10 PO) Take 1 capsule by mouth every evening.     COLCRYS 0.6 MG tablet TAKE 1 TABLET DAILY AS NEEDED FOR GOUT FLARE. (Patient taking differently: Take 0.6 mg by mouth daily as needed (gout flare up).) 90 tablet 3   CRANBERRY PO Take 4,200 mg by mouth every evening.     ELIQUIS 5 MG TABS tablet Take 5 mg by mouth 2 (two) times daily.      febuxostat (ULORIC) 40 MG tablet Take 40 mg by mouth daily.     [START ON 02/26/2022] fluticasone (FLONASE) 50 MCG/ACT nasal spray Place 1 spray into both nostrils daily. 1 g 0   gabapentin (NEURONTIN) 300 MG capsule Take 300 mg by mouth at bedtime.     glipiZIDE (GLUCOTROL  XL) 2.5 MG 24 hr tablet Take 2.5 mg by mouth daily with breakfast.     glucose blood (FREESTYLE LITE) test strip Test blood sugars up to 4 times daily 100 each 3   insulin glargine (LANTUS) 100 UNIT/ML Solostar Pen Inject 5 Units into the skin daily. (Patient taking differently: Inject 25 Units into the skin daily.) 3 mL 0   Insulin Pen Needle (PENTIPS) 32G X 4 MM MISC Use as directed 100 each 5   Insulin Pen Needle 32G X 4 MM MISC 5 Units by Does not apply route in the morning and at bedtime. 100 each 0   insulin regular (NOVOLIN R) 100 units/mL injection Inject into the skin 3 (three) times daily before meals. Started last week 4/28: Sliding scale before meals: <150 none,                                                                           151-199 - 2 units                                                                            200-249 - 4 units                                                                            250-299 - 6 units                                                                            300-349 - 8 units                                                                            350> 10 units     JANUVIA 25 MG tablet Take 25 mg by mouth daily.     Krill Oil 500 MG CAPS Take 500 mg by mouth daily.     Lancets (FREESTYLE) lancets Test blood sugars up to 4 times daily 100 each 3   memantine (NAMENDA) 10 MG tablet Take 1 tablet (10 mg total) by mouth 2 (two) times daily. 60 tablet 3  Methylfol-Algae-B12-Acetylcyst (CEREFOLIN NAC PO) Take 1 tablet by mouth in the morning.     metolazone (ZAROXOLYN) 2.5 MG tablet Take 2.5 mg by mouth See admin instructions. One tablet by mouth Monday and Thursday 30  min before Bumex     metoprolol succinate (TOPROL-XL) 25 MG 24 hr tablet Take 25 mg by mouth at bedtime.      Multiple Vitamin (MULTIVITAMIN WITH MINERALS) TABS tablet Take 1 tablet by mouth daily.     nitroGLYCERIN (NITROLINGUAL) 0.4 MG/SPRAY spray Place 1 spray under the tongue every 5 (five) minutes x 3 doses as needed for chest pain.     pantoprazole (PROTONIX) 40 MG tablet Take 1 tablet (40 mg total) by mouth 2 (two) times daily before a meal.     potassium chloride SA (KLOR-CON M) 20 MEQ tablet Take 20 mEq by mouth daily.     pramipexole (MIRAPEX) 0.25 MG tablet TAKE 1 TO 2 TABLETS AS INSTRUCTED, 1 TABLET AT 4:00 P.M. AND 2 TABLETS AT BEDTIME (Patient taking differently: See admin instructions. Take one tablet by mouth at 1 4 pm and 2 tablets at bedtime.) 270 tablet 1   rosuvastatin (CRESTOR) 20 MG tablet Take 20 mg by mouth daily.     No current facility-administered medications for this visit.   Facility-Administered Medications Ordered in Other Visits  Medication Dose Route Frequency Provider Last Rate Last Admin   acetaminophen (TYLENOL) tablet 650 mg  650 mg Oral Q6H PRN Howerter, Justin B, DO   650 mg at 02/24/22 2343   Or   acetaminophen (TYLENOL) suppository 650 mg  650 mg Rectal Q6H PRN Howerter, Justin B, DO       ALPRAZolam Duanne Moron) tablet 0.25 mg  0.25 mg Oral BID PRN Howerter, Justin B, DO   0.25 mg at 02/24/22 2344   amitriptyline (ELAVIL) tablet 25 mg  25 mg Oral QHS Howerter, Justin B, DO   25 mg at 02/24/22 2344   apixaban (ELIQUIS) tablet 5 mg  5 mg Oral BID Howerter, Justin B, DO   5 mg at 02/25/22 1059   fluticasone (FLONASE) 50 MCG/ACT nasal spray 1 spray  1 spray Each Nare Daily Ghimire, Dante Gang, MD       insulin aspart (novoLOG) injection 0-6 Units  0-6 Units Subcutaneous TID WC Howerter, Justin B, DO   3 Units at 02/25/22 1250   lactated ringers infusion   Intravenous Continuous Barb Merino, MD   Stopped at 02/25/22 1544   melatonin tablet 3 mg  3 mg Oral QHS PRN  Howerter, Justin B, DO   3 mg at 02/25/22 0253   memantine (NAMENDA) tablet 10 mg  10 mg Oral BID Howerter, Justin B, DO   10 mg at 02/25/22 1100   metoprolol succinate (TOPROL-XL) 24 hr tablet 25 mg  25 mg Oral Daily Howerter, Justin B, DO   25 mg at 02/25/22 1059   pantoprazole (PROTONIX) EC tablet 40 mg  40 mg Oral BID AC Howerter, Justin B, DO   40 mg at 02/25/22 0902   rosuvastatin (CRESTOR) tablet 20 mg  20 mg Oral Daily Howerter, Justin B, DO   20 mg at 02/25/22 1059    PHYSICAL EXAMINATION: ECOG PERFORMANCE STATUS: {CHL ONC ECOG PS:[id number]}  There were no vitals filed for this visit. Wt Readings from Last 3 Encounters:  02/24/22 181 lb (82.1 kg)  01/08/22 181 lb (82.1 kg)  10/06/21 174 lb 9.6 oz (79.2 kg)    {Only keep what was examined. If exam  not performed, can use .CEXAM } GENERAL:alert, no distress and comfortable SKIN: skin color, texture, turgor are normal, no rashes or significant lesions EYES: normal, Conjunctiva are pink and non-injected, sclera clear {OROPHARYNX:no exudate, no erythema and lips, buccal mucosa, and tongue normal}  NECK: supple, thyroid normal size, non-tender, without nodularity LYMPH:  no palpable lymphadenopathy in the cervical, axillary {or inguinal} LUNGS: clear to auscultation and percussion with normal breathing effort HEART: regular rate & rhythm and no murmurs and no lower extremity edema ABDOMEN:abdomen soft, non-tender and normal bowel sounds Musculoskeletal:no cyanosis of digits and no clubbing  NEURO: alert & oriented x 3 with fluent speech, no focal motor/sensory deficits  LABORATORY DATA:  I have reviewed the data as listed    Latest Ref Rng & Units 02/25/2022    6:47 AM 02/24/2022    6:52 PM 01/08/2022    6:47 PM  CBC  WBC 4.0 - 10.5 K/uL 8.0  10.6  9.5   Hemoglobin 12.0 - 15.0 g/dL 12.1  14.5  12.8   Hematocrit 36.0 - 46.0 % 36.1  43.7  39.8   Platelets 150 - 400 K/uL 232  255  188         Latest Ref Rng & Units  02/25/2022    6:47 AM 02/24/2022    4:50 PM 01/08/2022    6:47 PM  CMP  Glucose 70 - 99 mg/dL 181  161  193   BUN 8 - 23 mg/dL _0 Creatinine 0.44 - 1.00 mg/dL 1.84  1.87  1.43   Sodium 135 - 145 mmol/L 130  129  132   Potassium 3.5 - 5.1 mmol/L 4.1  3.7  4.0   Chloride 98 - 111 mmol/L 87  82  96   CO2 22 - 32 mmol/L 31  34  27   Calcium 8.9 - 10.3 mg/dL 8.7  9.9  9.4   Total Protein 6.5 - 8.1 g/dL 6.5  8.6    Total Bilirubin 0.3 - 1.2 mg/dL 0.9  0.6    Alkaline Phos 38 - 126 U/L 99  132    AST 15 - 41 U/L 62  54    ALT 0 - 44 U/L 19  26        RADIOGRAPHIC STUDIES: I have personally reviewed the radiological images as listed and agreed with the findings in the report. ECHOCARDIOGRAM COMPLETE  Result Date: 02/25/2022    ECHOCARDIOGRAM REPORT   Patient Name:   Rishika Mccollom Date of Exam: 02/25/2022 Medical Rec #:  [id number]        Height:       66.0 in Accession #:    [id number]       Weight:       181.0 lb Date of Birth:  Dec 25, 1940        BSA:          1.917 m Patient Age:    81 years         BP:           122/76 mmHg Patient Gender: F                HR:           86 bpm. Exam Location:  Inpatient Procedure: 2D Echo, Cardiac Doppler and Color Doppler Indications:    Syncope  History:        Patient has prior history of Echocardiogram examinations, most  recent 08/19/2021. CHF, CAD, Signs/Symptoms:Syncope; Risk                 Factors:Hypertension, Diabetes and Dyslipidemia. Hx DVT and                 pulmonary embolus. Hx stroke.  Sonographer:    Clayton Lefort RDCS (AE) Referring Phys: Rhetta Mura IMPRESSIONS  1. Left ventricular ejection fraction, by estimation, is 60 to 65%. The left ventricle has normal function. The left ventricle has no regional wall motion abnormalities. There is moderate asymmetric left ventricular hypertrophy of the basal-septal segment. Indeterminate diastolic filling due to E-A fusion.  2. Right ventricular systolic function is  normal. The right ventricular size is normal. Tricuspid regurgitation signal is inadequate for assessing PA pressure.  3. No evidence of mitral valve regurgitation.  4. The aortic valve was not well visualized. Aortic valve regurgitation is not visualized.  5. The inferior vena cava is dilated in size with <50% respiratory variability, suggesting right atrial pressure of 15 mmHg. Comparison(s): No significant change from prior study. FINDINGS  Left Ventricle: Left ventricular ejection fraction, by estimation, is 60 to 65%. The left ventricle has normal function. The left ventricle has no regional wall motion abnormalities. The left ventricular internal cavity size was normal in size. There is  moderate asymmetric left ventricular hypertrophy of the basal-septal segment. Indeterminate diastolic filling due to E-A fusion. Right Ventricle: The right ventricular size is normal. Right ventricular systolic function is normal. Tricuspid regurgitation signal is inadequate for assessing PA pressure. Left Atrium: Left atrial size was normal in size. Right Atrium: Right atrial size was normal in size. Pericardium: There is no evidence of pericardial effusion. Mitral Valve: No evidence of mitral valve regurgitation. MV peak gradient, 5.4 mmHg. The mean mitral valve gradient is 2.0 mmHg. Tricuspid Valve: Tricuspid valve regurgitation is not demonstrated. Aortic Valve: The aortic valve was not well visualized. Aortic valve regurgitation is not visualized. Aortic valve mean gradient measures 2.0 mmHg. Aortic valve peak gradient measures 4.2 mmHg. Aortic valve area, by VTI measures 2.62 cm. Pulmonic Valve: Pulmonic valve regurgitation is not visualized. Aorta: The aortic root and ascending aorta are structurally normal, with no evidence of dilitation. Venous: The inferior vena cava is dilated in size with less than 50% respiratory variability, suggesting right atrial pressure of 15 mmHg. IAS/Shunts: No atrial level shunt detected  by color flow Doppler.  LEFT VENTRICLE PLAX 2D LVIDd:         4.40 cm   Diastology LVIDs:         3.20 cm   LV e' medial:    5.55 cm/s LV PW:         1.10 cm   LV E/e' medial:  12.6 LV IVS:        1.40 cm   LV e' lateral:   7.40 cm/s LVOT diam:     1.90 cm   LV E/e' lateral: 9.4 LV SV:         54 LV SV Index:   28 LVOT Area:     2.84 cm  RIGHT VENTRICLE             IVC RV Basal diam:  2.40 cm     IVC diam: 2.20 cm RV S prime:     11.40 cm/s TAPSE (M-mode): 1.8 cm LEFT ATRIUM             Index        RIGHT ATRIUM  Index LA diam:        2.40 cm 1.25 cm/m   RA Area:     6.98 cm LA Vol (A2C):   31.3 ml 16.33 ml/m  RA Volume:   11.50 ml 6.00 ml/m LA Vol (A4C):   23.3 ml 12.16 ml/m LA Biplane Vol: 27.1 ml 14.14 ml/m  AORTIC VALVE AV Area (Vmax):    2.78 cm AV Area (Vmean):   2.75 cm AV Area (VTI):     2.62 cm AV Vmax:           103.00 cm/s AV Vmean:          72.500 cm/s AV VTI:            0.208 m AV Peak Grad:      4.2 mmHg AV Mean Grad:      2.0 mmHg LVOT Vmax:         101.00 cm/s LVOT Vmean:        70.400 cm/s LVOT VTI:          0.192 m LVOT/AV VTI ratio: 0.92  AORTA Ao Root diam: 3.30 cm Ao Asc diam:  3.40 cm MITRAL VALVE MV Area (PHT): 2.29 cm     SHUNTS MV Area VTI:   1.93 cm     Systemic VTI:  0.19 m MV Peak grad:  5.4 mmHg     Systemic Diam: 1.90 cm MV Mean grad:  2.0 mmHg MV Vmax:       1.16 m/s MV Vmean:      73.0 cm/s MV Decel Time: 331 msec MV E velocity: 69.80 cm/s MV A velocity: 101.00 cm/s MV E/A ratio:  0.69 Landscape architect signed by Phineas Inches Signature Date/Time: 02/25/2022/2:38:03 PM    Final    VAS [place] LOWER EXTREMITY VENOUS (DVT)  Result Date: 02/25/2022  Lower Venous DVT Study Patient Name:  [facility] GARNER Henrikson  Date of Exam:   02/25/2022 Medical Rec #: [id number]         Accession #:    [id number] Date of Birth: 1940-04-25         Patient Gender: F Patient Age:   39 years Exam Location:  [facility] Procedure:      VAS [place] LOWER EXTREMITY VENOUS (DVT) Referring  Phys: Babs Bertin --------------------------------------------------------------------------------  Indications: Swelling, and Erythema. Other Indications: Hx DVT per patient. All tests done within system have been                    negative including the last one 12/2021. Anticoagulation: Coumadin. Comparison Study: 12/2021 Performing Technologist: June Leap RDMS, RVT  Examination Guidelines: A complete evaluation includes B-mode imaging, spectral Doppler, color Doppler, and power Doppler as needed of all accessible portions of each vessel. Bilateral testing is considered an integral part of a complete examination. Limited examinations for reoccurring indications may be performed as noted. The reflux portion of the exam is performed with the patient in reverse Trendelenburg.  +---------+---------------+---------+-----------+----------+--------------+ RIGHT    CompressibilityPhasicitySpontaneityPropertiesThrombus Aging +---------+---------------+---------+-----------+----------+--------------+ CFV      Full           Yes      Yes                                 +---------+---------------+---------+-----------+----------+--------------+ SFJ      Full                                                        +---------+---------------+---------+-----------+----------+--------------+  FV Prox  Full                                                        +---------+---------------+---------+-----------+----------+--------------+ FV Mid   Full                                                        +---------+---------------+---------+-----------+----------+--------------+ FV DistalFull                                                        +---------+---------------+---------+-----------+----------+--------------+ PFV      Full                                                        +---------+---------------+---------+-----------+----------+--------------+ POP       Full           Yes      Yes                                 +---------+---------------+---------+-----------+----------+--------------+ PTV      Full                                                        +---------+---------------+---------+-----------+----------+--------------+ PERO     Full                                                        +---------+---------------+---------+-----------+----------+--------------+   +---------+---------------+---------+-----------+----------+--------------+ LEFT     CompressibilityPhasicitySpontaneityPropertiesThrombus Aging +---------+---------------+---------+-----------+----------+--------------+ CFV      Full           Yes      Yes                                 +---------+---------------+---------+-----------+----------+--------------+ SFJ      Full                                                        +---------+---------------+---------+-----------+----------+--------------+ FV Prox  Full                                                        +---------+---------------+---------+-----------+----------+--------------+  FV Mid   Full                                                        +---------+---------------+---------+-----------+----------+--------------+ FV DistalFull                                                        +---------+---------------+---------+-----------+----------+--------------+ PFV      Full                                                        +---------+---------------+---------+-----------+----------+--------------+ POP      Full           Yes      Yes                                 +---------+---------------+---------+-----------+----------+--------------+ PTV      Full                                                        +---------+---------------+---------+-----------+----------+--------------+ PERO     Full                                                         +---------+---------------+---------+-----------+----------+--------------+    Summary: BILATERAL: - No evidence of deep vein thrombosis seen in the lower extremities, bilaterally. -No evidence of popliteal cyst, bilaterally.   *See table(s) above for measurements and observations.    Preliminary    DG Shoulder Left  Result Date: 02/24/2022 CLINICAL DATA:  Fall with left shoulder pain EXAM: LEFT SHOULDER - 2+ VIEW COMPARISON:  Shoulder radiographs 04/07/2021 FINDINGS: No definite acute fracture or dislocation. 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 Similar widening of the [facility] joint. IMPRESSION: No acute fracture or dislocation. Unchanged widening of the AC joint. Electronically Signed   By: Placido Sou M.D.   On: 02/24/2022 17:29   DG Chest 2 View  Result Date: 02/24/2022 CLINICAL DATA:  Chest pain and left shoulder pain EXAM: CHEST - 2 VIEW COMPARISON:  Non radiographs 08/29/2021 and CT 08/18/2021 FINDINGS: Stable cardiomediastinal silhouette. Calcified tortuous aorta. Right basilar atelectasis. Otherwise no focal consolidation. No pleural effusion or pneumothorax. Elevated right hemidiaphragm. No displaced rib fractures. IMPRESSION: No active cardiopulmonary disease.  Calcified tortuous aorta. Electronically Signed   By: Placido Sou M.D.   On: 02/24/2022 17:26   CT HEAD WO CONTRAST  Result Date: 02/24/2022 CLINICAL DATA:  Syncope/presyncope, cerebrovascular cause suspected EXAM: CT HEAD WITHOUT CONTRAST TECHNIQUE: Contiguous axial images were obtained from the base of the skull  through the vertex without intravenous contrast. RADIATION DOSE REDUCTION: This exam was performed according to the departmental dose-optimization program which includes automated exposure control, adjustment of the mA and/or kV according to patient size and/or use of iterative reconstruction technique. COMPARISON:  CT head  08/18/2021, MRI head 08/18/2021 FINDINGS: Brain: Patchy and confluent areas of decreased attenuation are noted throughout the deep and periventricular white matter of the cerebral hemispheres bilaterally, compatible with chronic microvascular ischemic disease. No evidence of large-territorial acute infarction. No parenchymal hemorrhage. No mass lesion. No extra-axial collection. No mass effect or midline shift. No hydrocephalus. Basilar cisterns are patent. Vascular: No hyperdense vessel. Atherosclerotic calcifications are present within the cavernous internal carotid and vertebral arteries. Skull: No acute fracture or focal lesion. Sinuses/Orbits: Paranasal sinuses and mastoid air cells are clear. Bilateral lens replacement otherwise the orbits are unremarkable. Other: None. IMPRESSION: No acute intracranial abnormality. Electronically Signed   By: Iven Finn M.D.   On: 02/24/2022 17:20   CT Cervical Spine Wo Contrast  Result Date: 02/24/2022 CLINICAL DATA:  Neck trauma, midline tenderness (Age 19-64y) EXAM: CT CERVICAL SPINE WITHOUT CONTRAST TECHNIQUE: Multidetector CT imaging of the cervical spine was performed without intravenous contrast. Multiplanar CT image reconstructions were also generated. RADIATION DOSE REDUCTION: This exam was performed according to the departmental dose-optimization program which includes automated exposure control, adjustment of the mA and/or kV according to patient size and/or use of iterative reconstruction technique. COMPARISON:  None Available. FINDINGS: Alignment: Straightening of the cervical lordosis with slight reversal. There is degenerative grade 1 anterolisthesis at C4-C5, C5-C6, and C7-T1. Skull base and vertebrae: There is no acute cervical spine fracture. Soft tissues and spinal canal: No prevertebral fluid or swelling. No visible canal hematoma. Disc levels: There is multilevel degenerative disc disease, moderate-severe at C5-C6, C6-C7, and C7-T1. There is  moderate-severe multilevel facet arthropathy. Upper chest: Negative. Other: There is a subcentimeter left thyroid nodule which requires no follow-up imaging. IMPRESSION: No acute cervical spine fracture. Multilevel degenerative disc disease and facet arthropathy. Electronically Signed   By: Maurine Simmering M.D.   On: 02/24/2022 17:18      No orders of the defined types were placed in this encounter.  All questions were answered. The patient knows to call the clinic with any problems, questions or concerns. No barriers to learning was detected. The total time spent in the appointment was {CHL ONC TIME VISIT - BXIDH:[id number]}.     Baldemar Friday, CMA 02/25/2022   I, Audry Riles, CMA, am acting as scribe for Truitt Merle, MD.   {Add scribe attestation statement}

## 2022-02-25 NOTE — Progress Notes (Signed)
  Echocardiogram 2D Echocardiogram has been performed.  [name] 02/25/2022, 2:31 PM

## 2022-02-25 NOTE — Discharge Summary (Signed)
Physician Discharge Summary  [name] IOX:[id number] DOB: 05/27/1940 DOA: 02/24/2022  PCP: Garvin Fila, MD  Admit date: 02/24/2022 Discharge date: 02/25/2022  Admitted From: Home Disposition: Home with home health  Recommendations for Outpatient Follow-up:  Follow up with PCP in 1-2 weeks Please obtain BMP/CBC in one week Schedule follow-up with cardiology  Home Health: PT/OT Equipment/Devices: None.  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  Patient has mild dementia but she does tell us that most of the events happened when she is standing from sitting position.  She was diagnosed with orthostatic symptoms in the past, she forgets to slow down.  Compliant to medications.  Patient is on Bumex and metolazone for diuresis.  In the emergency room hemodynamically stable.  Mild AKI.  Gentle IV fluids overnight.  Blood pressure stable. Patient had 20 point drop in systolic blood pressure on mobility and mild dizziness probably contributing to the symptoms.  Patient is insisting on going home.  Duplexes of lower extremities were negative for DVT.  Echocardiogram essentially unchanged from past.  Patient remains in sinus rhythm.  Orthostatic dizziness and syncope: -Reduce Bumex dose to half, 2 mg daily and continue metolazone. -All-time fall precautions.  Orthostatic precautions. -Compression stockings both legs.  Assisted mobility. -Home health PT OT.  Home safety evaluation. -Follow-up with cardiology, if worsening  symptoms will need midodrine.  No other change in medications were done.   Discharge Diagnoses:  Principal Problem:   Syncope Active Problems:   HLD (hyperlipidemia)   Acute hyponatremia   Chronic diastolic CHF (congestive heart failure) (HCC)   DM2 (diabetes mellitus, type 2) (HCC)   Acute renal failure superimposed on stage 3b chronic kidney disease (HCC)   Hypercalcemia   Leukocytosis   GAD (generalized anxiety disorder)    Discharge Instructions  Discharge Instructions     Diet - low sodium heart healthy   Complete by: As directed    Discharge instructions   Complete by: As directed    All-time fall precautions.  Compression stockings and elevation of the leg most of the time.   Increase activity slowly   Complete by: As directed       Allergies as of 02/25/2022       Reactions   Nsaids Nausea And Vomiting, Palpitations   Reglan [metoclopramide] Other (See Comments)   Seizures  Tardive dyskinesia Paradoxical reaction not relieved by benadryl   Ultram [tramadol] Other (See Comments)   Pt has seizure activity with this medication   Lipitor [atorvastatin] Swelling, Other (See Comments)   Myalgias     Lyrica [pregabalin] Swelling, Other (See Comments)   Weight gain   Urecholine [bethanechol] Nausea Only, Palpitations        Medication List     TAKE these medications    ALPRAZolam 0.25 MG tablet Commonly known as: XANAX Take 1-2 tablets (0.25-0.5 mg total) by mouth 2 (two) times daily as needed for anxiety.   amitriptyline 25 MG tablet Commonly known as: ELAVIL Take 1 tablet (25 mg total) by mouth at bedtime.   Biotin 10000 MCG Tabs Take 1 tablet by mouth every evening.  bumetanide 2 MG tablet Commonly known as: BUMEX Take 1 tablet (2 mg total) by mouth daily. What changed: how much to take   CEREFOLIN NAC PO Take 1 tablet by mouth in the morning.   Colcrys 0.6 MG tablet Generic drug: colchicine TAKE 1 TABLET DAILY AS NEEDED FOR GOUT  FLARE. What changed: See the new instructions.   COQ10 PO Take 1 capsule by mouth every evening.   CRANBERRY PO Take 4,200 mg by mouth every evening.   Eliquis 5 MG Tabs tablet Generic drug: apixaban Take 5 mg by mouth 2 (two) times daily.   febuxostat 40 MG tablet Commonly known as: ULORIC Take 40 mg by mouth daily.   fluticasone 50 MCG/ACT nasal spray Commonly known as: FLONASE Place 1 spray into both nostrils daily. Start taking on: February 26, 2022   freestyle lancets Test blood sugars up to 4 times daily   FREESTYLE LITE test strip Generic drug: glucose blood Test blood sugars up to 4 times daily   FreeStyle Lite w/Device Kit use as directed   gabapentin 300 MG capsule Commonly known as: NEURONTIN Take 300 mg by mouth at bedtime.   glipiZIDE 2.5 MG 24 hr tablet Commonly known as: GLUCOTROL XL Take 2.5 mg by mouth daily with breakfast.   insulin regular 100 units/mL injection Commonly known as: NOVOLIN R Inject into the skin 3 (three) times daily before meals. Started last week 4/28: Sliding scale before meals: <150 none,                                                                           151-199 - 2 units                                                                            200-249 - 4 units                                                                            250-299 - 6 units                                                                            300-349 - 8 units  350> 10 units   Januvia 25 MG tablet Generic drug: sitaGLIPtin Take 25 mg by mouth daily.   Krill Oil 500 MG Caps Take 500 mg by mouth daily.   Lantus SoloStar 100 UNIT/ML Solostar Pen Generic drug: insulin glargine Inject 5 Units into the skin daily. What changed: how much to take   memantine 10 MG tablet Commonly known as: Namenda Take 1 tablet (10 mg total) by mouth 2 (two) times daily. What  changed: Another medication with the same name was removed. Continue taking this medication, and follow the directions you see here.   metolazone 2.5 MG tablet Commonly known as: ZAROXOLYN Take 2.5 mg by mouth See admin instructions. One tablet by mouth Monday and Thursday 30 min before Bumex   metoprolol succinate 25 MG 24 hr tablet Commonly known as: TOPROL-XL Take 25 mg by mouth at bedtime.   multivitamin with minerals Tabs tablet Take 1 tablet by mouth daily.   nitroGLYCERIN 0.4 MG/SPRAY spray Commonly known as: NITROLINGUAL Place 1 spray under the tongue every 5 (five) minutes x 3 doses as needed for chest pain.   pantoprazole 40 MG tablet Commonly known as: PROTONIX Take 1 tablet (40 mg total) by mouth 2 (two) times daily before a meal.   PenTips 32G X 4 MM Misc Generic drug: Insulin Pen Needle 5 Units by Does not apply route in the morning and at bedtime.   PenTips 32G X 4 MM Misc Generic drug: Insulin Pen Needle Use as directed   potassium chloride SA 20 MEQ tablet Commonly known as: KLOR-CON M Take 20 mEq by mouth daily.   pramipexole 0.25 MG tablet Commonly known as: MIRAPEX TAKE 1 TO 2 TABLETS AS INSTRUCTED, 1 TABLET AT 4:00 P.M. AND 2 TABLETS AT BEDTIME What changed: See the new instructions.   rosuvastatin 20 MG tablet Commonly known as: CRESTOR Take 20 mg by mouth daily.   Vitamin D3 50 MCG (2000 UT) capsule Take 2,000 Units by mouth every evening.        Allergies  Allergen Reactions   Nsaids Nausea And Vomiting and Palpitations   Reglan [Metoclopramide] Other (See Comments)    Seizures  Tardive dyskinesia Paradoxical reaction not relieved by benadryl   Ultram [Tramadol] Other (See Comments)    Pt has seizure activity with this medication   Lipitor [Atorvastatin] Swelling and Other (See Comments)    Myalgias     Lyrica [Pregabalin] Swelling and Other (See Comments)    Weight gain    Urecholine [Bethanechol] Nausea Only and Palpitations     Consultations: None   Procedures/Studies: ECHOCARDIOGRAM COMPLETE  Result Date: 02/25/2022    ECHOCARDIOGRAM REPORT   Patient Name:   [name] Date of Exam: 02/25/2022 Medical Rec #:  [id number]        Height:       66.0 in Accession #:    [id number]       Weight:       181.0 lb Date of Birth:  1940/08/08        BSA:          1.917 m Patient Age:    34 years         BP:           122/76 mmHg Patient Gender: F                HR:           86 bpm. Exam Location:  Inpatient Procedure: 2D Echo,  Cardiac Doppler and Color Doppler Indications:    Syncope  History:        Patient has prior history of Echocardiogram examinations, most                 recent 08/19/2021. CHF, CAD, Signs/Symptoms:Syncope; Risk                 Factors:Hypertension, Diabetes and Dyslipidemia. Hx DVT and                 pulmonary embolus. Hx stroke.  Sonographer:    Clayton Lefort RDCS (AE) Referring Phys: Rhetta Mura IMPRESSIONS  1. Left ventricular ejection fraction, by estimation, is 60 to 65%. The left ventricle has normal function. The left ventricle has no regional wall motion abnormalities. There is moderate asymmetric left ventricular hypertrophy of the basal-septal segment. Indeterminate diastolic filling due to E-A fusion.  2. Right ventricular systolic function is normal. The right ventricular size is normal. Tricuspid regurgitation signal is inadequate for assessing PA pressure.  3. No evidence of mitral valve regurgitation.  4. The aortic valve was not well visualized. Aortic valve regurgitation is not visualized.  5. The inferior vena cava is dilated in size with <50% respiratory variability, suggesting right atrial pressure of 15 mmHg. Comparison(s): No significant change from prior study. FINDINGS  Left Ventricle: Left ventricular ejection fraction, by estimation, is 60 to 65%. The left ventricle has normal function. The left ventricle has no regional wall motion abnormalities. The left ventricular internal  cavity size was normal in size. There is  moderate asymmetric left ventricular hypertrophy of the basal-septal segment. Indeterminate diastolic filling due to E-A fusion. Right Ventricle: The right ventricular size is normal. Right ventricular systolic function is normal. Tricuspid regurgitation signal is inadequate for assessing PA pressure. Left Atrium: Left atrial size was normal in size. Right Atrium: Right atrial size was normal in size. Pericardium: There is no evidence of pericardial effusion. Mitral Valve: No evidence of mitral valve regurgitation. MV peak gradient, 5.4 mmHg. The mean mitral valve gradient is 2.0 mmHg. Tricuspid Valve: Tricuspid valve regurgitation is not demonstrated. Aortic Valve: The aortic valve was not well visualized. Aortic valve regurgitation is not visualized. Aortic valve mean gradient measures 2.0 mmHg. Aortic valve peak gradient measures 4.2 mmHg. Aortic valve area, by VTI measures 2.62 cm. Pulmonic Valve: Pulmonic valve regurgitation is not visualized. Aorta: The aortic root and ascending aorta are structurally normal, with no evidence of dilitation. Venous: The inferior vena cava is dilated in size with less than 50% respiratory variability, suggesting right atrial pressure of 15 mmHg. IAS/Shunts: No atrial level shunt detected by color flow Doppler.  LEFT VENTRICLE PLAX 2D LVIDd:         4.40 cm   Diastology LVIDs:         3.20 cm   LV e' medial:    5.55 cm/s LV PW:         1.10 cm   LV E/e' medial:  12.6 LV IVS:        1.40 cm   LV e' lateral:   7.40 cm/s LVOT diam:     1.90 cm   LV E/e' lateral: 9.4 LV SV:         54 LV SV Index:   28 LVOT Area:     2.84 cm  RIGHT VENTRICLE             IVC RV Basal diam:  2.40 cm     IVC  diam: 2.20 cm RV S prime:     11.40 cm/s TAPSE (M-mode): 1.8 cm LEFT ATRIUM             Index        RIGHT ATRIUM          Index LA diam:        2.40 cm 1.25 cm/m   RA Area:     6.98 cm LA Vol (A2C):   31.3 ml 16.33 ml/m  RA Volume:   11.50 ml 6.00  ml/m LA Vol (A4C):   23.3 ml 12.16 ml/m LA Biplane Vol: 27.1 ml 14.14 ml/m  AORTIC VALVE AV Area (Vmax):    2.78 cm AV Area (Vmean):   2.75 cm AV Area (VTI):     2.62 cm AV Vmax:           103.00 cm/s AV Vmean:          72.500 cm/s AV VTI:            0.208 m AV Peak Grad:      4.2 mmHg AV Mean Grad:      2.0 mmHg LVOT Vmax:         101.00 cm/s LVOT Vmean:        70.400 cm/s LVOT VTI:          0.192 m LVOT/AV VTI ratio: 0.92  AORTA Ao Root diam: 3.30 cm Ao Asc diam:  3.40 cm MITRAL VALVE MV Area (PHT): 2.29 cm     SHUNTS MV Area VTI:   1.93 cm     Systemic VTI:  0.19 m MV Peak grad:  5.4 mmHg     Systemic Diam: 1.90 cm MV Mean grad:  2.0 mmHg MV Vmax:       1.16 m/s MV Vmean:      73.0 cm/s MV Decel Time: 331 msec MV E velocity: 69.80 cm/s MV A velocity: 101.00 cm/s MV E/A ratio:  0.69 Landscape architect signed by Phineas Inches Signature Date/Time: 02/25/2022/2:38:03 PM    Final    VAS [place] LOWER EXTREMITY VENOUS (DVT)  Result Date: 02/25/2022  Lower Venous DVT Study Patient Name:  [facility] GARNER Mulford  Date of Exam:   02/25/2022 Medical Rec #: [id number]         Accession #:    [id number] Date of Birth: [birth year]         Patient Gender: F Patient Age:   73 years Exam Location:  [facility] Procedure:      VAS [place] LOWER EXTREMITY VENOUS (DVT) Referring Phys: Babs Bertin --------------------------------------------------------------------------------  Indications: Swelling, and Erythema. Other Indications: Hx DVT per patient. All tests done within system have been                    negative including the last one 12/2021. Anticoagulation: Coumadin. Comparison Study: 12/2021 Performing Technologist: June Leap RDMS, RVT  Examination Guidelines: A complete evaluation includes B-mode imaging, spectral Doppler, color Doppler, and power Doppler as needed of all accessible portions of each vessel. Bilateral testing is considered an integral part of a complete examination. Limited examinations for  reoccurring indications may be performed as noted. The reflux portion of the exam is performed with the patient in reverse Trendelenburg.  +---------+---------------+---------+-----------+----------+--------------+ RIGHT    CompressibilityPhasicitySpontaneityPropertiesThrombus Aging +---------+---------------+---------+-----------+----------+--------------+ CFV      Full           Yes      Yes                                 +---------+---------------+---------+-----------+----------+--------------+  SFJ      Full                                                        +---------+---------------+---------+-----------+----------+--------------+ FV Prox  Full                                                        +---------+---------------+---------+-----------+----------+--------------+ FV Mid   Full                                                        +---------+---------------+---------+-----------+----------+--------------+ FV DistalFull                                                        +---------+---------------+---------+-----------+----------+--------------+ PFV      Full                                                        +---------+---------------+---------+-----------+----------+--------------+ POP      Full           Yes      Yes                                 +---------+---------------+---------+-----------+----------+--------------+ PTV      Full                                                        +---------+---------------+---------+-----------+----------+--------------+ PERO     Full                                                        +---------+---------------+---------+-----------+----------+--------------+   +---------+---------------+---------+-----------+----------+--------------+ LEFT     CompressibilityPhasicitySpontaneityPropertiesThrombus Aging  +---------+---------------+---------+-----------+----------+--------------+ CFV      Full           Yes      Yes                                 +---------+---------------+---------+-----------+----------+--------------+ SFJ      Full                                                        +---------+---------------+---------+-----------+----------+--------------+  FV Prox  Full                                                        +---------+---------------+---------+-----------+----------+--------------+ FV Mid   Full                                                        +---------+---------------+---------+-----------+----------+--------------+ FV DistalFull                                                        +---------+---------------+---------+-----------+----------+--------------+ PFV      Full                                                        +---------+---------------+---------+-----------+----------+--------------+ POP      Full           Yes      Yes                                 +---------+---------------+---------+-----------+----------+--------------+ PTV      Full                                                        +---------+---------------+---------+-----------+----------+--------------+ PERO     Full                                                        +---------+---------------+---------+-----------+----------+--------------+    Summary: BILATERAL: - No evidence of deep vein thrombosis seen in the lower extremities, bilaterally. -No evidence of popliteal cyst, bilaterally.   *See table(s) above for measurements and observations.    Preliminary    DG Shoulder Left  Result Date: 02/24/2022 CLINICAL DATA:  Fall with left shoulder pain EXAM: LEFT SHOULDER - 2+ VIEW COMPARISON:  Shoulder radiographs 04/07/2021 FINDINGS: No definite acute fracture or dislocation. Cortical irregularity about the inferior medial humeral head  is likely related to prominent hypertrophic arthritis. Similar widening of the [facility] joint. IMPRESSION: No acute fracture or dislocation. Unchanged widening of the AC joint. Electronically Signed   By: Placido Sou M.D.   On: 02/24/2022 17:29   DG Chest 2 View  Result Date: 02/24/2022 CLINICAL DATA:  Chest pain and left shoulder pain EXAM: CHEST - 2 VIEW COMPARISON:  Non radiographs 08/29/2021 and CT 08/18/2021 FINDINGS: Stable cardiomediastinal silhouette. Calcified tortuous aorta. Right basilar atelectasis. Otherwise no focal consolidation. No pleural effusion or pneumothorax. Elevated right hemidiaphragm. No  displaced rib fractures. IMPRESSION: No active cardiopulmonary disease.  Calcified tortuous aorta. Electronically Signed   By: Placido Sou M.D.   On: 02/24/2022 17:26   CT HEAD WO CONTRAST  Result Date: 02/24/2022 CLINICAL DATA:  Syncope/presyncope, cerebrovascular cause suspected EXAM: CT HEAD WITHOUT CONTRAST TECHNIQUE: Contiguous axial images were obtained from the base of the skull through the vertex without intravenous contrast. RADIATION DOSE REDUCTION: This exam was performed according to the departmental dose-optimization program which includes automated exposure control, adjustment of the mA and/or kV according to patient size and/or use of iterative reconstruction technique. COMPARISON:  CT head 08/18/2021, MRI head 08/18/2021 FINDINGS: Brain: Patchy and confluent areas of decreased attenuation are noted throughout the deep and periventricular white matter of the cerebral hemispheres bilaterally, compatible with chronic microvascular ischemic disease. No evidence of large-territorial acute infarction. No parenchymal hemorrhage. No mass lesion. No extra-axial collection. No mass effect or midline shift. No hydrocephalus. Basilar cisterns are patent. Vascular: No hyperdense vessel. Atherosclerotic calcifications are present within the cavernous internal carotid and vertebral arteries.  Skull: No acute fracture or focal lesion. Sinuses/Orbits: Paranasal sinuses and mastoid air cells are clear. Bilateral lens replacement otherwise the orbits are unremarkable. Other: None. IMPRESSION: No acute intracranial abnormality. Electronically Signed   By: Iven Finn M.D.   On: 02/24/2022 17:20   CT Cervical Spine Wo Contrast  Result Date: 02/24/2022 CLINICAL DATA:  Neck trauma, midline tenderness (Age 43-64y) EXAM: CT CERVICAL SPINE WITHOUT CONTRAST TECHNIQUE: Multidetector CT imaging of the cervical spine was performed without intravenous contrast. Multiplanar CT image reconstructions were also generated. RADIATION DOSE REDUCTION: This exam was performed according to the departmental dose-optimization program which includes automated exposure control, adjustment of the mA and/or kV according to patient size and/or use of iterative reconstruction technique. COMPARISON:  None Available. FINDINGS: Alignment: Straightening of the cervical lordosis with slight reversal. There is degenerative grade 1 anterolisthesis at C4-C5, C5-C6, and C7-T1. Skull base and vertebrae: There is no acute cervical spine fracture. Soft tissues and spinal canal: No prevertebral fluid or swelling. No visible canal hematoma. Disc levels: There is multilevel degenerative disc disease, moderate-severe at C5-C6, C6-C7, and C7-T1. There is moderate-severe multilevel facet arthropathy. Upper chest: Negative. Other: There is a subcentimeter left thyroid nodule which requires no follow-up imaging. IMPRESSION: No acute cervical spine fracture. Multilevel degenerative disc disease and facet arthropathy. Electronically Signed   By: Maurine Simmering M.D.   On: 02/24/2022 17:18   (Echo, Carotid, EGD, Colonoscopy, ERCP)    Subjective: Patient seen in the morning rounds.  Daughter was at the bedside.  She was still in the emergency room.  Patient had slight dizziness on working with physical therapy but blood pressure remained more than 90.   Patient desperately wants to go home.  She does not want to stay in the hospital. We completed reasonable test in the acute settings and family agreed that she will be more comfortable at home. Additional dose of magnesium sulfate was given.  Discharge Exam: Vitals:   02/25/22 1230 02/25/22 1445  BP: 127/77   Pulse: 88   Resp: 20   Temp:  98.1 F (36.7 C)  SpO2: 94%    Vitals:   02/25/22 1005 02/25/22 1044 02/25/22 1230 02/25/22 1445  BP: 122/76  127/77   Pulse: (!) 101  88   Resp: (!) 41  20   Temp:  98.2 F (36.8 C)  98.1 F (36.7 C)  TempSrc:  Oral  Oral  SpO2: 98%  94%  Weight:      Height:        General: Pt is alert, awake, not in acute distress Patient is on room air.  She is mostly oriented with some sort of memory loss.  Pleasant to conversation.  Interactive. Cardiovascular: RRR, S1/S2 +, no rubs, no gallops Respiratory: CTA bilaterally, no wheezing, no rhonchi Abdominal: Soft, NT, ND, bowel sounds + Extremities: 2+ bilateral pedal edema, mostly chronic and nonpitting.    The results of significant diagnostics from this hospitalization (including imaging, microbiology, ancillary and laboratory) are listed below for reference.     Microbiology: No results found for this or any previous visit (from the past 240 hour(s)).   Labs: BNP (last 3 results) Recent Labs    08/16/21 2011 01/08/22 1847 02/24/22 1650  BNP 40.5 28.0 16.1   Basic Metabolic Panel: Recent Labs  Lab 02/24/22 1650 02/25/22 0647  NA 129* 130*  K 3.7 4.1  CL 82* 87*  CO2 34* 31  GLUCOSE 161* 181*  BUN 17 20  CREATININE 1.87* 1.84*  CALCIUM 9.9 8.7*  MG  --  1.6*  PHOS  --  3.6   Liver Function Tests: Recent Labs  Lab 02/24/22 1650 02/25/22 0647  AST 54* 62*  ALT 26 19  ALKPHOS 132* 99  BILITOT 0.6 0.9  PROT 8.6* 6.5  ALBUMIN 3.3* 2.4*   No results for input(s): "LIPASE", "AMYLASE" in the last 168 hours. Recent Labs  Lab 02/24/22 1907  AMMONIA 14    CBC: Recent Labs  Lab 02/24/22 1852 02/25/22 0647  WBC 10.6* 8.0  NEUTROABS 7.7 5.8  HGB 14.5 12.1  HCT 43.7 36.1  MCV 90.1 90.0  PLT 255 232   Cardiac Enzymes: No results for input(s): "CKTOTAL", "CKMB", "CKMBINDEX", "TROPONINI" in the last 168 hours. BNP: Invalid input(s): "POCBNP" CBG: Recent Labs  Lab 02/24/22 1654 02/24/22 2352 02/25/22 0818 02/25/22 1235  GLUCAP 167* 240* 244* 294*   D-Dimer Recent Labs    02/24/22 1907  DDIMER 1.06*   Hgb A1c No results for input(s): "HGBA1C" in the last 72 hours. Lipid Profile No results for input(s): "CHOL", "HDL", "LDLCALC", "TRIG", "CHOLHDL", "LDLDIRECT" in the last 72 hours. Thyroid function studies Recent Labs    02/25/22 0647  TSH 2.586   Anemia work up No results for input(s): "VITAMINB12", "FOLATE", "FERRITIN", "TIBC", "IRON", "RETICCTPCT" in the last 72 hours. Urinalysis    Component Value Date/Time   COLORURINE STRAW (A) 02/24/2022 2117   APPEARANCEUR HAZY (A) 02/24/2022 2117   APPEARANCEUR Clear 10/07/2021 1507   LABSPEC 1.005 02/24/2022 2117   PHURINE 7.0 02/24/2022 2117   GLUCOSEU NEGATIVE 02/24/2022 2117   HGBUR SMALL (A) 02/24/2022 2117   BILIRUBINUR NEGATIVE 02/24/2022 2117   BILIRUBINUR Negative 10/07/2021 [place] 02/24/2022 2117   PROTEINUR 100 (A) 02/24/2022 2117   UROBILINOGEN 0.2 10/05/2013 0059   NITRITE NEGATIVE 02/24/2022 2117   LEUKOCYTESUR LARGE (A) 02/24/2022 2117   Sepsis Labs Recent Labs  Lab 02/24/22 1852 02/25/22 0647  WBC 10.6* 8.0   Microbiology No results found for this or any previous visit (from the past 240 hour(s)).   Time coordinating discharge: 35 minutes  SIGNED:   Barb Merino, MD  [facility] 02/25/2022, 3:03 PM

## 2022-02-25 NOTE — Evaluation (Signed)
Occupational Therapy Evaluation Patient Details Name: [name] MRN: [id number] DOB: 1941/03/06 Today's Date: 02/25/2022   History of Present Illness 81 y.o. female presents to [facility] hospital on 02/24/2022 after syncopal episode. PMH includes PE, LLE DVT, dementia, CKD III, DMII, HTN, HLD, peripheral neuropathy, charcot foot.   Clinical Impression   PTA, pt lives with spouse, typically able to ambulate short distances though uses scooter for most mobility due to R charcot foot. Pt likes to stay busy, able to manage basic ADLs and IADLs with little assistance. Pt has a supportive daughter that is often around to assist. Pt presents now fairly close to reported baseline though limited by orthostasis. Per pt/daughter, dizziness present even at rest though exacerbated with movement. Pt requires Setup for UB ADL, Min A for LB ADLs and min guard for transfers with RW. Educated re: monitoring of symptoms and when to sit down to avoid a fall, task modification if dizziness remains an issue. Discussed DC rec options with pt/family opting for HHOT.  BP supine: 132/76 BP sitting: 129/84 BP standing: 113/64 BP when back to supine: 157/85      Recommendations for follow up therapy are one component of a multi-disciplinary discharge planning process, led by the attending physician.  Recommendations may be updated based on patient status, additional functional criteria and insurance authorization.   Follow Up Recommendations  Home health OT     Assistance Recommended at Discharge Intermittent Supervision/Assistance  Patient can return home with the following A little help with walking and/or transfers;A little help with bathing/dressing/bathroom;Assistance with cooking/housework;Direct supervision/assist for financial management;Direct supervision/assist for medications management    Functional Status Assessment  Patient has had a recent decline in their functional status and demonstrates the  ability to make significant improvements in function in a reasonable and predictable amount of time.  Equipment Recommendations  None recommended by OT    Recommendations for Other Services       Precautions / Restrictions Precautions Precautions: Fall Precaution Comments: charcot foot R, orthostatic hypotension Restrictions Weight Bearing Restrictions: No Other Position/Activity Restrictions: per daughter, limit mobility and WB through RLE due to Charcot foot if possible      Mobility Bed Mobility Overal bed mobility: Needs Assistance Bed Mobility: Supine to Sit, Sit to Supine     Supine to sit: Supervision Sit to supine: Supervision        Transfers Overall transfer level: Needs assistance Equipment used: Rolling walker (2 wheels) Transfers: Sit to/from Stand Sit to Stand: Min guard                  Balance Overall balance assessment: Needs assistance Sitting-balance support: No upper extremity supported, Feet supported Sitting balance-Leahy Scale: Fair     Standing balance support: Single extremity supported, Reliant on assistive device for balance, Bilateral upper extremity supported Standing balance-Leahy Scale: Poor                             ADL either performed or assessed with clinical judgement   ADL Overall ADL's : Needs assistance/impaired Eating/Feeding: Independent   Grooming: Set up;Sitting   Upper Body Bathing: Set up;Sitting   Lower Body Bathing: Minimal assistance;Sit to/from stand   Upper Body Dressing : Set up;Sitting   Lower Body Dressing: Minimal assistance;Sit to/from stand   Toilet Transfer: Min guard;Stand-pivot;Rolling walker (2 wheels)   Toileting- Clothing Manipulation and Hygiene: Minimal assistance;Sitting/lateral lean;Sit to/from stand  General ADL Comments: Fairly close to baseline for ADLs, though limited by orthostatic symptoms and decreased activity tolerance from typical baseline.      Vision Baseline Vision/History: 1 Wears glasses;4 Cataracts Ability to See in Adequate Light: 0 Adequate Patient Visual Report: No change from baseline Vision Assessment?: No apparent visual deficits     Perception     Praxis      Pertinent Vitals/Pain Pain Assessment Pain Assessment: No/denies pain     Hand Dominance Right   Extremity/Trunk Assessment Upper Extremity Assessment Upper Extremity Assessment: Generalized weakness   Lower Extremity Assessment Lower Extremity Assessment: Defer to PT evaluation   Cervical / Trunk Assessment Cervical / Trunk Assessment: Kyphotic   Communication Communication Communication: No difficulties   Cognition Arousal/Alertness: Awake/alert Behavior During Therapy: WFL for tasks assessed/performed Overall Cognitive Status: History of cognitive impairments - at baseline Area of Impairment: Memory, Safety/judgement                     Memory: Decreased short-term memory, Decreased recall of precautions   Safety/Judgement: Decreased awareness of safety     General Comments: hx of dementia, appropriate and pleasant in conversation, can sometimes push self too much despite orthostatic symptoms, chest pain per daughter     General Comments  Daughter present, very supportive and aware/involved in pt's care    Exercises     Shoulder Instructions      Home Living Family/patient expects to be discharged to:: Private residence Living Arrangements: Spouse/significant other Available Help at Discharge: Family;Available 24 hours/day (spouse available 24/7 but health is declining, daughter available PRN, 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 Pt otherwise utilizes Transport planner. able to transfer self to/from scooter ADLs Comments: on good days, can start laundry, wash dishes, and perform ADLs with little assistance. pt has been wearing depends more consistently. pt describes herself as a Lobbyist" and likes to stay busy        OT Problem List: Decreased strength;Decreased activity tolerance;Impaired balance (sitting and/or standing)      OT Treatment/Interventions: Self-care/ADL training;Therapeutic exercise;Energy conservation;DME and/or AE instruction;Therapeutic activities;Patient/family education    OT Goals(Current goals can be found in the care plan section) Acute Rehab OT Goals Patient Stated Goal: find out cause of symptoms, get back home OT Goal Formulation: With patient/family Potential to Achieve Goals: Good  OT Frequency: Min 2X/week    Co-evaluation              AM-PAC OT "6 Clicks" Daily Activity     Outcome Measure Help from another person eating meals?: None Help from another person taking care of personal grooming?: A Little Help from another person toileting, which includes using toliet, bedpan, or urinal?: A Little Help from another person bathing (including washing, rinsing, drying)?: A Little Help from another person to put on and taking off regular upper body clothing?: A Little Help from another person  to put on and taking off regular lower body clothing?: A Little 6 Click Score: 19   End of Session Equipment Utilized During Treatment: Rolling walker (2 wheels) Nurse Communication: Mobility status  Activity Tolerance: Patient tolerated treatment well;Treatment limited secondary  to medical complications (Comment) (limited by dizziness) Patient left: in bed;with call bell/phone within reach;with family/visitor present  OT Visit Diagnosis: Unsteadiness on feet (R26.81);Other abnormalities of gait and mobility (R26.89)                Time: 7628-3151 OT Time Calculation (min): 27 min Charges:  OT General Charges $OT Visit: 1 Visit OT Evaluation $OT Eval Low Complexity: 1 Low OT Treatments $Self Care/Home Management : 8-22 mins  Malachy Chamber, OTR/L Acute Rehab Services Office: [phone]   Layla Maw 02/25/2022, 12:27 PM

## 2022-02-25 NOTE — Evaluation (Signed)
Physical Therapy Evaluation Patient Details Name: [name] MRN: [id number] DOB: [birth year] Today's Date: 02/25/2022  History of Present Illness  81 y.o. female presents to [facility] hospital on 02/24/2022 after syncopal episode. PMH includes PE, LLE DVT, dementia, CKD III, DMII, HTN, HLD, peripheral neuropathy, charcot foot.  Clinical Impression  Pt presents to PT with deficits in activity tolerance related to dizziness when mobilizing. Pt reports dizziness at rest upon PT arrival, with symptoms exacerbated by positional changes and head turns during session. Pt is orthostatic during session, documented below. Pt also reports increased dizziness with VOR and head turns during session. PT provides suggestion for use of compression stockings, along with increased time and exercise between positional changes. PT recommends use of electric scooter for as much mobility as possible rather than ambulation. PT also recommends performance of VOR x1 exercise, with further follow-up from HHPT to progress mobility and exercise at the time of discharge. Pt remains at a high risk for falls at this time due to symptoms and orthostatic BP, however SNF placement does not seem to provide much benefit opposed to HHPT, pt and family in agreement.   02/25/22 1028  Vital Signs  Patient Position (if appropriate) Orthostatic Vitals  Orthostatic Lying   BP- Lying 118/68  Pulse- Lying 92  Orthostatic Sitting  BP- Sitting 103/73  Pulse- Sitting 97  Orthostatic Standing at 0 minutes  BP- Standing at 0 minutes 91/60  Pulse- Standing at 0 minutes 103  Orthostatic Standing at 3 minutes  BP- Standing at 3 minutes  (unable to tolerate standing for 3 minutes 2/2 dizziness)       Recommendations for follow up therapy are one component of a multi-disciplinary discharge planning process, led by the attending physician.  Recommendations may be updated based on patient status, additional functional criteria and insurance  authorization.  Follow Up Recommendations Home health PT      Assistance Recommended at Discharge Intermittent Supervision/Assistance  Patient can return home with the following  A little help with walking and/or transfers;A little help with bathing/dressing/bathroom;Assistance with cooking/housework;Direct supervision/assist for medications management;Direct supervision/assist for financial management;Assist for transportation    Equipment Recommendations Other (comment) (compression stockings)  Recommendations for Other Services       Functional Status Assessment Patient has had a recent decline in their functional status and demonstrates the ability to make significant improvements in function in a reasonable and predictable amount of time.     Precautions / Restrictions Precautions Precautions: Fall Precaution Comments: charcot foot R, orthostatic hypotension Restrictions Weight Bearing Restrictions:  (per daughter, pt is to limite WB through RLE 2/2 charcot foot)      Mobility  Bed Mobility Overal bed mobility: Needs Assistance Bed Mobility: Supine to Sit, Sit to Supine     Supine to sit: Min guard, HOB elevated Sit to supine: Min assist   General bed mobility comments: minA from daughter to return to supine    Transfers Overall transfer level: Needs assistance Equipment used: Rolling walker (2 wheels) Transfers: Sit to/from Stand Sit to Stand: Min guard                Ambulation/Gait Ambulation/Gait assistance: Min guard Gait Distance (Feet): 0 Feet Assistive device: Rolling walker (2 wheels)       Pre-gait activities: pt marches in place with support of RW for ~6 steps total, ambulation deferred due to symptoms of dizziness    Science writer  Modified Rankin (Stroke Patients Only)       Balance Overall balance assessment: Needs assistance Sitting-balance support: No upper extremity supported, Feet  supported Sitting balance-Leahy Scale: Fair     Standing balance support: Single extremity supported, Reliant on assistive device for balance Standing balance-Leahy Scale: Poor                               Pertinent Vitals/Pain Pain Assessment Pain Assessment: Faces Faces Pain Scale: Hurts little more Pain Location: R posterior calf Pain Descriptors / Indicators: Sore Pain Intervention(s): Monitored during session    Home Living Family/patient expects to be discharged to:: Private residence Living Arrangements: Spouse/significant other Available Help at Discharge: Family;Available 24 hours/day (spouse available 24/7 but health is declining, daughter available PRN, works driving trucks and must sleep some days) Type of Home: House Home Access: [place]: [place]: Conservation officer, nature (2 wheels);BSC/3in1;Wheelchair - Press photographer;Shower seat - built in      Prior Function Prior Level of Function : Needs assist             Mobility Comments: pt ambulates short household distances, often without DME, despite MD recommendations to limit walking. Pt otherwise utilizes Transport planner. ADLs Comments: Needs assist with ADLs from family     Hand Dominance   Dominant Hand: Right    Extremity/Trunk Assessment   Upper Extremity Assessment Upper Extremity Assessment: Overall WFL for tasks assessed    Lower Extremity Assessment Lower Extremity Assessment: Generalized weakness (R charcot foot)    Cervical / Trunk Assessment Cervical / Trunk Assessment: Kyphotic  Communication   Communication: No difficulties  Cognition Arousal/Alertness: Awake/alert Behavior During Therapy: WFL for tasks assessed/performed Overall Cognitive Status: Impaired/Different from baseline Area of Impairment: Memory, Safety/judgement                     Memory: Decreased short-term memory, Decreased recall of precautions    Safety/Judgement: Decreased awareness of safety     General Comments: pt self-reports being stubborn, often trys to push through symptoms rather than stopping and sitting        General Comments General comments (skin integrity, edema, etc.): orthostatic vitals documented in flowsheet. Pt positive for orthostatic BP with symptoms of increased dizziness with positional changes. Of note pt dizzy at rest prior to mobilizing. Pt also reports dizziness with lateral head turns (VOR R +, no nystagmus noted with VOR or pursuits). Pt reports often having symptoms of SOB or chest pain prior to dizziness at home, these are not always associated with change in position.    Exercises Other Exercises Other Exercises: VOR x1 horizontal   Assessment/Plan    PT Assessment Patient needs continued PT services  PT Problem List Decreased activity tolerance;Decreased balance;Decreased mobility;Cardiopulmonary status limiting activity;Decreased safety awareness       PT Treatment Interventions DME instruction;Gait training;Functional mobility training;Therapeutic activities;Therapeutic exercise;Balance training;Neuromuscular re-education;Patient/family education    PT Goals (Current goals can be found in the Care Plan section)  Acute Rehab PT Goals Patient Stated Goal: to return home, improve tolerance for mobility PT Goal Formulation: With patient/family Time For Goal Achievement: 03/11/22 Potential to Achieve Goals: Fair Additional Goals Additional Goal #1: Pt will be able to verbalize precautions for managing orthostatic hypotension, including increased time between positional changes, use of compression stockings, and LE exercise between positional changes.    Frequency Min  3X/week     Co-evaluation               AM-PAC PT "6 Clicks" Mobility  Outcome Measure Help needed turning from your back to your side while in a flat bed without using bedrails?: A Little Help needed moving from  lying on your back to sitting on the side of a flat bed without using bedrails?: A Little Help needed moving to and from a bed to a chair (including a wheelchair)?: A Little Help needed standing up from a chair using your arms (e.g., wheelchair or bedside chair)?: A Little Help needed to walk in hospital room?: Total Help needed climbing 3-5 steps with a railing? : Total 6 Click Score: 14    End of Session   Activity Tolerance: Treatment limited secondary to medical complications (Comment) (orthostatic hypotension, dizziness) Patient left: in bed;with call bell/phone within reach;with family/visitor present Nurse Communication: Mobility status PT Visit Diagnosis: Other abnormalities of gait and mobility (R26.89);Muscle weakness (generalized) (M62.81);Other symptoms and signs involving the nervous system (R29.898)    Time: 1610-9604 PT Time Calculation (min) (ACUTE ONLY): 47 min   Charges:   PT Evaluation $PT Eval Low Complexity: 1 Low PT Treatments $Therapeutic Activity: 8-22 mins        Zenaida Niece, PT, DPT Acute Rehabilitation Office [phone]   Zenaida Niece 02/25/2022, 11:11 AM

## 2022-02-26 ENCOUNTER — Inpatient Hospital Stay: Payer: Medicare Other | Admitting: Hematology

## 2022-02-26 ENCOUNTER — Ambulatory Visit: Payer: Medicare Other | Admitting: Hematology

## 2022-02-26 ENCOUNTER — Other Ambulatory Visit: Payer: Medicare Other

## 2022-02-26 ENCOUNTER — Inpatient Hospital Stay: Payer: Medicare Other

## 2022-02-26 DIAGNOSIS — M8580 Other specified disorders of bone density and structure, unspecified site: Secondary | ICD-10-CM | POA: Insufficient documentation

## 2022-02-26 NOTE — Assessment & Plan Note (Deleted)
-  Her 04/2019 DEXA showed worsened Osteopenia with lowest T-score -2.3 at left hip. -Given her CKD, will hold treatment with Bisphosphonate  -Continue calcium and vit D

## 2022-02-26 NOTE — Assessment & Plan Note (Deleted)
-  She was diagnosed in 09/2016. She is s/p b/l breast lumpectomy and adjuvant radiation. 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  -She started antiestrogen therapy with Exemestane in 02/2017. She is tolerating well with no side effects, plan for 5 years therapy

## 2022-03-03 DIAGNOSIS — D631 Anemia in chronic kidney disease: Secondary | ICD-10-CM | POA: Diagnosis not present

## 2022-03-03 DIAGNOSIS — N1832 Chronic kidney disease, stage 3b: Secondary | ICD-10-CM | POA: Diagnosis not present

## 2022-03-03 DIAGNOSIS — N2581 Secondary hyperparathyroidism of renal origin: Secondary | ICD-10-CM | POA: Diagnosis not present

## 2022-03-03 DIAGNOSIS — I129 Hypertensive chronic kidney disease with stage 1 through stage 4 chronic kidney disease, or unspecified chronic kidney disease: Secondary | ICD-10-CM | POA: Diagnosis not present

## 2022-03-13 DIAGNOSIS — L03116 Cellulitis of left lower limb: Secondary | ICD-10-CM | POA: Diagnosis not present

## 2022-03-13 DIAGNOSIS — N189 Chronic kidney disease, unspecified: Secondary | ICD-10-CM | POA: Diagnosis not present

## 2022-03-13 DIAGNOSIS — E119 Type 2 diabetes mellitus without complications: Secondary | ICD-10-CM | POA: Diagnosis not present

## 2022-03-13 DIAGNOSIS — Z7401 Bed confinement status: Secondary | ICD-10-CM | POA: Diagnosis not present

## 2022-03-13 DIAGNOSIS — L03115 Cellulitis of right lower limb: Secondary | ICD-10-CM | POA: Diagnosis not present

## 2022-03-13 DIAGNOSIS — I509 Heart failure, unspecified: Secondary | ICD-10-CM | POA: Diagnosis not present

## 2022-03-13 DIAGNOSIS — N3289 Other specified disorders of bladder: Secondary | ICD-10-CM | POA: Diagnosis not present

## 2022-03-13 DIAGNOSIS — L89890 Pressure ulcer of other site, unstageable: Secondary | ICD-10-CM | POA: Diagnosis not present

## 2022-03-13 DIAGNOSIS — R6521 Severe sepsis with septic shock: Secondary | ICD-10-CM | POA: Diagnosis not present

## 2022-03-13 DIAGNOSIS — G309 Alzheimer's disease, unspecified: Secondary | ICD-10-CM | POA: Diagnosis not present

## 2022-03-13 DIAGNOSIS — R739 Hyperglycemia, unspecified: Secondary | ICD-10-CM | POA: Diagnosis not present

## 2022-03-13 DIAGNOSIS — Z853 Personal history of malignant neoplasm of breast: Secondary | ICD-10-CM | POA: Diagnosis not present

## 2022-03-13 DIAGNOSIS — Z86718 Personal history of other venous thrombosis and embolism: Secondary | ICD-10-CM | POA: Diagnosis not present

## 2022-03-13 DIAGNOSIS — N179 Acute kidney failure, unspecified: Secondary | ICD-10-CM | POA: Diagnosis not present

## 2022-03-13 DIAGNOSIS — I2510 Atherosclerotic heart disease of native coronary artery without angina pectoris: Secondary | ICD-10-CM | POA: Diagnosis not present

## 2022-03-13 DIAGNOSIS — G44201 Tension-type headache, unspecified, intractable: Secondary | ICD-10-CM | POA: Diagnosis not present

## 2022-03-13 DIAGNOSIS — I82502 Chronic embolism and thrombosis of unspecified deep veins of left lower extremity: Secondary | ICD-10-CM | POA: Diagnosis not present

## 2022-03-13 DIAGNOSIS — N183 Chronic kidney disease, stage 3 unspecified: Secondary | ICD-10-CM | POA: Diagnosis not present

## 2022-03-13 DIAGNOSIS — F0284 Dementia in other diseases classified elsewhere, unspecified severity, with anxiety: Secondary | ICD-10-CM | POA: Diagnosis not present

## 2022-03-13 DIAGNOSIS — F0283 Dementia in other diseases classified elsewhere, unspecified severity, with mood disturbance: Secondary | ICD-10-CM | POA: Diagnosis not present

## 2022-03-13 DIAGNOSIS — I10 Essential (primary) hypertension: Secondary | ICD-10-CM | POA: Diagnosis not present

## 2022-03-13 DIAGNOSIS — L89159 Pressure ulcer of sacral region, unspecified stage: Secondary | ICD-10-CM | POA: Diagnosis not present

## 2022-03-13 DIAGNOSIS — F0280 Dementia in other diseases classified elsewhere without behavioral disturbance: Secondary | ICD-10-CM | POA: Diagnosis not present

## 2022-03-13 DIAGNOSIS — I959 Hypotension, unspecified: Secondary | ICD-10-CM | POA: Diagnosis not present

## 2022-03-13 DIAGNOSIS — L89306 Pressure-induced deep tissue damage of unspecified buttock: Secondary | ICD-10-CM | POA: Diagnosis not present

## 2022-03-13 DIAGNOSIS — N390 Urinary tract infection, site not specified: Secondary | ICD-10-CM | POA: Diagnosis not present

## 2022-03-13 DIAGNOSIS — R609 Edema, unspecified: Secondary | ICD-10-CM | POA: Diagnosis not present

## 2022-03-13 DIAGNOSIS — Z20822 Contact with and (suspected) exposure to covid-19: Secondary | ICD-10-CM | POA: Diagnosis not present

## 2022-03-13 DIAGNOSIS — R109 Unspecified abdominal pain: Secondary | ICD-10-CM | POA: Diagnosis not present

## 2022-03-13 DIAGNOSIS — A419 Sepsis, unspecified organism: Secondary | ICD-10-CM | POA: Diagnosis not present

## 2022-03-13 DIAGNOSIS — E1122 Type 2 diabetes mellitus with diabetic chronic kidney disease: Secondary | ICD-10-CM | POA: Diagnosis not present

## 2022-03-13 DIAGNOSIS — L89610 Pressure ulcer of right heel, unstageable: Secondary | ICD-10-CM | POA: Diagnosis not present

## 2022-03-13 DIAGNOSIS — R4182 Altered mental status, unspecified: Secondary | ICD-10-CM | POA: Diagnosis not present

## 2022-03-13 DIAGNOSIS — R6520 Severe sepsis without septic shock: Secondary | ICD-10-CM | POA: Diagnosis not present

## 2022-03-13 DIAGNOSIS — F0390 Unspecified dementia without behavioral disturbance: Secondary | ICD-10-CM | POA: Diagnosis not present

## 2022-03-13 DIAGNOSIS — L89156 Pressure-induced deep tissue damage of sacral region: Secondary | ICD-10-CM | POA: Diagnosis not present

## 2022-03-13 DIAGNOSIS — R519 Headache, unspecified: Secondary | ICD-10-CM | POA: Diagnosis not present

## 2022-03-13 DIAGNOSIS — R531 Weakness: Secondary | ICD-10-CM | POA: Diagnosis not present

## 2022-03-13 DIAGNOSIS — G2581 Restless legs syndrome: Secondary | ICD-10-CM | POA: Diagnosis not present

## 2022-03-13 DIAGNOSIS — M109 Gout, unspecified: Secondary | ICD-10-CM | POA: Diagnosis not present

## 2022-03-13 DIAGNOSIS — R509 Fever, unspecified: Secondary | ICD-10-CM | POA: Diagnosis not present

## 2022-03-13 DIAGNOSIS — I480 Paroxysmal atrial fibrillation: Secondary | ICD-10-CM | POA: Diagnosis not present

## 2022-03-13 DIAGNOSIS — F02818 Dementia in other diseases classified elsewhere, unspecified severity, with other behavioral disturbance: Secondary | ICD-10-CM | POA: Diagnosis not present

## 2022-03-13 DIAGNOSIS — Z7901 Long term (current) use of anticoagulants: Secondary | ICD-10-CM | POA: Diagnosis not present

## 2022-03-13 DIAGNOSIS — M14679 Charcot's joint, unspecified ankle and foot: Secondary | ICD-10-CM | POA: Diagnosis not present

## 2022-03-13 DIAGNOSIS — F32A Depression, unspecified: Secondary | ICD-10-CM | POA: Diagnosis not present

## 2022-03-13 DIAGNOSIS — E11610 Type 2 diabetes mellitus with diabetic neuropathic arthropathy: Secondary | ICD-10-CM | POA: Diagnosis not present

## 2022-03-13 DIAGNOSIS — E785 Hyperlipidemia, unspecified: Secondary | ICD-10-CM | POA: Diagnosis not present

## 2022-03-13 DIAGNOSIS — R000 Tachycardia, unspecified: Secondary | ICD-10-CM | POA: Diagnosis not present

## 2022-03-13 DIAGNOSIS — K219 Gastro-esophageal reflux disease without esophagitis: Secondary | ICD-10-CM | POA: Diagnosis not present

## 2022-03-13 DIAGNOSIS — F419 Anxiety disorder, unspecified: Secondary | ICD-10-CM | POA: Diagnosis not present

## 2022-03-13 DIAGNOSIS — Z7984 Long term (current) use of oral hypoglycemic drugs: Secondary | ICD-10-CM | POA: Diagnosis not present

## 2022-03-13 DIAGNOSIS — N3000 Acute cystitis without hematuria: Secondary | ICD-10-CM | POA: Diagnosis not present

## 2022-03-13 DIAGNOSIS — Z794 Long term (current) use of insulin: Secondary | ICD-10-CM | POA: Diagnosis not present

## 2022-03-13 DIAGNOSIS — Z66 Do not resuscitate: Secondary | ICD-10-CM | POA: Diagnosis not present

## 2022-03-13 DIAGNOSIS — M7989 Other specified soft tissue disorders: Secondary | ICD-10-CM | POA: Diagnosis not present

## 2022-03-18 DIAGNOSIS — E1122 Type 2 diabetes mellitus with diabetic chronic kidney disease: Secondary | ICD-10-CM | POA: Diagnosis not present

## 2022-03-18 DIAGNOSIS — K219 Gastro-esophageal reflux disease without esophagitis: Secondary | ICD-10-CM | POA: Diagnosis not present

## 2022-03-18 DIAGNOSIS — E785 Hyperlipidemia, unspecified: Secondary | ICD-10-CM | POA: Diagnosis not present

## 2022-03-18 DIAGNOSIS — M14679 Charcot's joint, unspecified ankle and foot: Secondary | ICD-10-CM | POA: Diagnosis not present

## 2022-03-18 DIAGNOSIS — E119 Type 2 diabetes mellitus without complications: Secondary | ICD-10-CM | POA: Diagnosis not present

## 2022-03-18 DIAGNOSIS — I509 Heart failure, unspecified: Secondary | ICD-10-CM | POA: Diagnosis not present

## 2022-03-18 DIAGNOSIS — R5381 Other malaise: Secondary | ICD-10-CM | POA: Diagnosis not present

## 2022-03-18 DIAGNOSIS — I480 Paroxysmal atrial fibrillation: Secondary | ICD-10-CM | POA: Diagnosis not present

## 2022-03-18 DIAGNOSIS — G300 Alzheimer's disease with early onset: Secondary | ICD-10-CM | POA: Diagnosis not present

## 2022-03-18 DIAGNOSIS — Z7401 Bed confinement status: Secondary | ICD-10-CM | POA: Diagnosis not present

## 2022-03-18 DIAGNOSIS — E1142 Type 2 diabetes mellitus with diabetic polyneuropathy: Secondary | ICD-10-CM | POA: Diagnosis not present

## 2022-03-18 DIAGNOSIS — I2510 Atherosclerotic heart disease of native coronary artery without angina pectoris: Secondary | ICD-10-CM | POA: Diagnosis not present

## 2022-03-18 DIAGNOSIS — Z853 Personal history of malignant neoplasm of breast: Secondary | ICD-10-CM | POA: Diagnosis not present

## 2022-03-18 DIAGNOSIS — Z7901 Long term (current) use of anticoagulants: Secondary | ICD-10-CM | POA: Diagnosis not present

## 2022-03-18 DIAGNOSIS — F419 Anxiety disorder, unspecified: Secondary | ICD-10-CM | POA: Diagnosis not present

## 2022-03-18 DIAGNOSIS — R42 Dizziness and giddiness: Secondary | ICD-10-CM | POA: Diagnosis not present

## 2022-03-18 DIAGNOSIS — R531 Weakness: Secondary | ICD-10-CM | POA: Diagnosis not present

## 2022-03-18 DIAGNOSIS — I10 Essential (primary) hypertension: Secondary | ICD-10-CM | POA: Diagnosis not present

## 2022-03-18 DIAGNOSIS — A419 Sepsis, unspecified organism: Secondary | ICD-10-CM | POA: Diagnosis not present

## 2022-03-18 DIAGNOSIS — E1169 Type 2 diabetes mellitus with other specified complication: Secondary | ICD-10-CM | POA: Diagnosis not present

## 2022-03-18 DIAGNOSIS — N183 Chronic kidney disease, stage 3 unspecified: Secondary | ICD-10-CM | POA: Diagnosis not present

## 2022-03-18 DIAGNOSIS — I82432 Acute embolism and thrombosis of left popliteal vein: Secondary | ICD-10-CM | POA: Diagnosis not present

## 2022-03-18 DIAGNOSIS — G309 Alzheimer's disease, unspecified: Secondary | ICD-10-CM | POA: Diagnosis not present

## 2022-03-18 DIAGNOSIS — N390 Urinary tract infection, site not specified: Secondary | ICD-10-CM | POA: Diagnosis not present

## 2022-03-18 DIAGNOSIS — F0390 Unspecified dementia without behavioral disturbance: Secondary | ICD-10-CM | POA: Diagnosis not present

## 2022-03-18 DIAGNOSIS — F0280 Dementia in other diseases classified elsewhere without behavioral disturbance: Secondary | ICD-10-CM | POA: Diagnosis not present

## 2022-03-18 DIAGNOSIS — M109 Gout, unspecified: Secondary | ICD-10-CM | POA: Diagnosis not present

## 2022-03-18 DIAGNOSIS — Z794 Long term (current) use of insulin: Secondary | ICD-10-CM | POA: Diagnosis not present

## 2022-03-18 DIAGNOSIS — L03115 Cellulitis of right lower limb: Secondary | ICD-10-CM | POA: Diagnosis not present

## 2022-03-18 DIAGNOSIS — R0789 Other chest pain: Secondary | ICD-10-CM | POA: Diagnosis not present

## 2022-03-18 DIAGNOSIS — I82502 Chronic embolism and thrombosis of unspecified deep veins of left lower extremity: Secondary | ICD-10-CM | POA: Diagnosis not present

## 2022-03-19 DIAGNOSIS — Z853 Personal history of malignant neoplasm of breast: Secondary | ICD-10-CM | POA: Diagnosis not present

## 2022-03-19 DIAGNOSIS — E1169 Type 2 diabetes mellitus with other specified complication: Secondary | ICD-10-CM | POA: Diagnosis not present

## 2022-03-19 DIAGNOSIS — G300 Alzheimer's disease with early onset: Secondary | ICD-10-CM | POA: Diagnosis not present

## 2022-03-19 DIAGNOSIS — R5381 Other malaise: Secondary | ICD-10-CM | POA: Diagnosis not present

## 2022-03-19 DIAGNOSIS — I509 Heart failure, unspecified: Secondary | ICD-10-CM | POA: Diagnosis not present

## 2022-03-19 DIAGNOSIS — I2510 Atherosclerotic heart disease of native coronary artery without angina pectoris: Secondary | ICD-10-CM | POA: Diagnosis not present

## 2022-03-19 DIAGNOSIS — N183 Chronic kidney disease, stage 3 unspecified: Secondary | ICD-10-CM | POA: Diagnosis not present

## 2022-03-19 DIAGNOSIS — M109 Gout, unspecified: Secondary | ICD-10-CM | POA: Diagnosis not present

## 2022-03-19 DIAGNOSIS — F0280 Dementia in other diseases classified elsewhere without behavioral disturbance: Secondary | ICD-10-CM | POA: Diagnosis not present

## 2022-03-19 DIAGNOSIS — M14679 Charcot's joint, unspecified ankle and foot: Secondary | ICD-10-CM | POA: Diagnosis not present

## 2022-03-19 DIAGNOSIS — I82502 Chronic embolism and thrombosis of unspecified deep veins of left lower extremity: Secondary | ICD-10-CM | POA: Diagnosis not present

## 2022-03-19 DIAGNOSIS — E1122 Type 2 diabetes mellitus with diabetic chronic kidney disease: Secondary | ICD-10-CM | POA: Diagnosis not present

## 2022-03-20 DIAGNOSIS — L03115 Cellulitis of right lower limb: Secondary | ICD-10-CM | POA: Diagnosis not present

## 2022-03-21 DIAGNOSIS — I82502 Chronic embolism and thrombosis of unspecified deep veins of left lower extremity: Secondary | ICD-10-CM | POA: Diagnosis not present

## 2022-03-21 DIAGNOSIS — L03115 Cellulitis of right lower limb: Secondary | ICD-10-CM | POA: Diagnosis not present

## 2022-03-21 DIAGNOSIS — Z794 Long term (current) use of insulin: Secondary | ICD-10-CM | POA: Diagnosis not present

## 2022-03-21 DIAGNOSIS — I10 Essential (primary) hypertension: Secondary | ICD-10-CM | POA: Diagnosis not present

## 2022-03-21 DIAGNOSIS — K219 Gastro-esophageal reflux disease without esophagitis: Secondary | ICD-10-CM | POA: Diagnosis not present

## 2022-03-21 DIAGNOSIS — E785 Hyperlipidemia, unspecified: Secondary | ICD-10-CM | POA: Diagnosis not present

## 2022-03-21 DIAGNOSIS — E119 Type 2 diabetes mellitus without complications: Secondary | ICD-10-CM | POA: Diagnosis not present

## 2022-03-22 DIAGNOSIS — K219 Gastro-esophageal reflux disease without esophagitis: Secondary | ICD-10-CM | POA: Diagnosis not present

## 2022-03-22 DIAGNOSIS — E119 Type 2 diabetes mellitus without complications: Secondary | ICD-10-CM | POA: Diagnosis not present

## 2022-03-22 DIAGNOSIS — Z794 Long term (current) use of insulin: Secondary | ICD-10-CM | POA: Diagnosis not present

## 2022-03-22 DIAGNOSIS — L03115 Cellulitis of right lower limb: Secondary | ICD-10-CM | POA: Diagnosis not present

## 2022-03-22 DIAGNOSIS — I82502 Chronic embolism and thrombosis of unspecified deep veins of left lower extremity: Secondary | ICD-10-CM | POA: Diagnosis not present

## 2022-03-22 DIAGNOSIS — E785 Hyperlipidemia, unspecified: Secondary | ICD-10-CM | POA: Diagnosis not present

## 2022-03-22 DIAGNOSIS — I10 Essential (primary) hypertension: Secondary | ICD-10-CM | POA: Diagnosis not present

## 2022-03-23 DIAGNOSIS — Z86718 Personal history of other venous thrombosis and embolism: Secondary | ICD-10-CM | POA: Diagnosis not present

## 2022-03-23 DIAGNOSIS — Z7901 Long term (current) use of anticoagulants: Secondary | ICD-10-CM | POA: Diagnosis not present

## 2022-03-23 DIAGNOSIS — E119 Type 2 diabetes mellitus without complications: Secondary | ICD-10-CM | POA: Diagnosis not present

## 2022-03-23 DIAGNOSIS — Z794 Long term (current) use of insulin: Secondary | ICD-10-CM | POA: Diagnosis not present

## 2022-03-23 DIAGNOSIS — L03115 Cellulitis of right lower limb: Secondary | ICD-10-CM | POA: Diagnosis not present

## 2022-03-23 DIAGNOSIS — I10 Essential (primary) hypertension: Secondary | ICD-10-CM | POA: Diagnosis not present

## 2022-03-24 DIAGNOSIS — Z794 Long term (current) use of insulin: Secondary | ICD-10-CM | POA: Diagnosis not present

## 2022-03-24 DIAGNOSIS — L03115 Cellulitis of right lower limb: Secondary | ICD-10-CM | POA: Diagnosis not present

## 2022-03-24 DIAGNOSIS — E119 Type 2 diabetes mellitus without complications: Secondary | ICD-10-CM | POA: Diagnosis not present

## 2022-03-24 DIAGNOSIS — I10 Essential (primary) hypertension: Secondary | ICD-10-CM | POA: Diagnosis not present

## 2022-03-25 DIAGNOSIS — G309 Alzheimer's disease, unspecified: Secondary | ICD-10-CM | POA: Diagnosis not present

## 2022-03-25 DIAGNOSIS — E785 Hyperlipidemia, unspecified: Secondary | ICD-10-CM | POA: Diagnosis not present

## 2022-03-25 DIAGNOSIS — M109 Gout, unspecified: Secondary | ICD-10-CM | POA: Diagnosis not present

## 2022-03-25 DIAGNOSIS — L0390 Cellulitis, unspecified: Secondary | ICD-10-CM | POA: Diagnosis not present

## 2022-03-25 DIAGNOSIS — I509 Heart failure, unspecified: Secondary | ICD-10-CM | POA: Diagnosis not present

## 2022-03-25 DIAGNOSIS — Z743 Need for continuous supervision: Secondary | ICD-10-CM | POA: Diagnosis not present

## 2022-03-25 DIAGNOSIS — I2510 Atherosclerotic heart disease of native coronary artery without angina pectoris: Secondary | ICD-10-CM | POA: Diagnosis not present

## 2022-03-25 DIAGNOSIS — I82502 Chronic embolism and thrombosis of unspecified deep veins of left lower extremity: Secondary | ICD-10-CM | POA: Diagnosis not present

## 2022-03-25 DIAGNOSIS — E119 Type 2 diabetes mellitus without complications: Secondary | ICD-10-CM | POA: Diagnosis not present

## 2022-03-25 DIAGNOSIS — E1169 Type 2 diabetes mellitus with other specified complication: Secondary | ICD-10-CM | POA: Diagnosis not present

## 2022-03-25 DIAGNOSIS — E1122 Type 2 diabetes mellitus with diabetic chronic kidney disease: Secondary | ICD-10-CM | POA: Diagnosis not present

## 2022-03-25 DIAGNOSIS — F0390 Unspecified dementia without behavioral disturbance: Secondary | ICD-10-CM | POA: Diagnosis not present

## 2022-03-25 DIAGNOSIS — I10 Essential (primary) hypertension: Secondary | ICD-10-CM | POA: Diagnosis not present

## 2022-03-25 DIAGNOSIS — M14679 Charcot's joint, unspecified ankle and foot: Secondary | ICD-10-CM | POA: Diagnosis not present

## 2022-03-25 DIAGNOSIS — I480 Paroxysmal atrial fibrillation: Secondary | ICD-10-CM | POA: Diagnosis not present

## 2022-03-25 DIAGNOSIS — I82402 Acute embolism and thrombosis of unspecified deep veins of left lower extremity: Secondary | ICD-10-CM | POA: Diagnosis not present

## 2022-03-25 DIAGNOSIS — F419 Anxiety disorder, unspecified: Secondary | ICD-10-CM | POA: Diagnosis not present

## 2022-03-25 DIAGNOSIS — N183 Chronic kidney disease, stage 3 unspecified: Secondary | ICD-10-CM | POA: Diagnosis not present

## 2022-03-25 DIAGNOSIS — F0280 Dementia in other diseases classified elsewhere without behavioral disturbance: Secondary | ICD-10-CM | POA: Diagnosis not present

## 2022-03-25 DIAGNOSIS — L03115 Cellulitis of right lower limb: Secondary | ICD-10-CM | POA: Diagnosis not present

## 2022-03-25 DIAGNOSIS — Z853 Personal history of malignant neoplasm of breast: Secondary | ICD-10-CM | POA: Diagnosis not present

## 2022-03-26 DIAGNOSIS — E785 Hyperlipidemia, unspecified: Secondary | ICD-10-CM | POA: Diagnosis not present

## 2022-03-26 DIAGNOSIS — I82502 Chronic embolism and thrombosis of unspecified deep veins of left lower extremity: Secondary | ICD-10-CM | POA: Diagnosis not present

## 2022-03-26 DIAGNOSIS — L03115 Cellulitis of right lower limb: Secondary | ICD-10-CM | POA: Diagnosis not present

## 2022-03-26 DIAGNOSIS — E1122 Type 2 diabetes mellitus with diabetic chronic kidney disease: Secondary | ICD-10-CM | POA: Diagnosis not present

## 2022-03-26 DIAGNOSIS — I509 Heart failure, unspecified: Secondary | ICD-10-CM | POA: Diagnosis not present

## 2022-03-26 DIAGNOSIS — M14679 Charcot's joint, unspecified ankle and foot: Secondary | ICD-10-CM | POA: Diagnosis not present

## 2022-03-26 DIAGNOSIS — N183 Chronic kidney disease, stage 3 unspecified: Secondary | ICD-10-CM | POA: Diagnosis not present

## 2022-03-26 DIAGNOSIS — M109 Gout, unspecified: Secondary | ICD-10-CM | POA: Diagnosis not present

## 2022-03-26 DIAGNOSIS — I2510 Atherosclerotic heart disease of native coronary artery without angina pectoris: Secondary | ICD-10-CM | POA: Diagnosis not present

## 2022-03-26 DIAGNOSIS — E1169 Type 2 diabetes mellitus with other specified complication: Secondary | ICD-10-CM | POA: Diagnosis not present

## 2022-03-26 DIAGNOSIS — F0280 Dementia in other diseases classified elsewhere without behavioral disturbance: Secondary | ICD-10-CM | POA: Diagnosis not present

## 2022-03-26 DIAGNOSIS — G309 Alzheimer's disease, unspecified: Secondary | ICD-10-CM | POA: Diagnosis not present

## 2022-09-07 ENCOUNTER — Encounter (HOSPITAL_BASED_OUTPATIENT_CLINIC_OR_DEPARTMENT_OTHER): Payer: Self-pay

## 2022-09-07 ENCOUNTER — Emergency Department (HOSPITAL_BASED_OUTPATIENT_CLINIC_OR_DEPARTMENT_OTHER)
Admission: EM | Admit: 2022-09-07 | Discharge: 2022-09-07 | Disposition: A | Discharge status: Home / Self Care | Payer: Medicare Other | Attending: Emergency Medicine | Admitting: Emergency Medicine

## 2022-09-07 ENCOUNTER — Other Ambulatory Visit: Payer: Self-pay

## 2022-09-07 DIAGNOSIS — N3090 Cystitis, unspecified without hematuria: Secondary | ICD-10-CM | POA: Insufficient documentation

## 2022-09-07 DIAGNOSIS — G309 Alzheimer's disease, unspecified: Secondary | ICD-10-CM | POA: Insufficient documentation

## 2022-09-07 DIAGNOSIS — Z79899 Other long term (current) drug therapy: Secondary | ICD-10-CM | POA: Diagnosis not present

## 2022-09-07 DIAGNOSIS — E1122 Type 2 diabetes mellitus with diabetic chronic kidney disease: Secondary | ICD-10-CM | POA: Diagnosis not present

## 2022-09-07 DIAGNOSIS — R600 Localized edema: Secondary | ICD-10-CM | POA: Diagnosis not present

## 2022-09-07 DIAGNOSIS — Z794 Long term (current) use of insulin: Secondary | ICD-10-CM | POA: Insufficient documentation

## 2022-09-07 DIAGNOSIS — F0280 Dementia in other diseases classified elsewhere without behavioral disturbance: Secondary | ICD-10-CM | POA: Diagnosis not present

## 2022-09-07 DIAGNOSIS — I129 Hypertensive chronic kidney disease with stage 1 through stage 4 chronic kidney disease, or unspecified chronic kidney disease: Secondary | ICD-10-CM | POA: Insufficient documentation

## 2022-09-07 DIAGNOSIS — R300 Dysuria: Secondary | ICD-10-CM | POA: Diagnosis present

## 2022-09-07 DIAGNOSIS — N183 Chronic kidney disease, stage 3 unspecified: Secondary | ICD-10-CM | POA: Insufficient documentation

## 2022-09-07 DIAGNOSIS — Z853 Personal history of malignant neoplasm of breast: Secondary | ICD-10-CM | POA: Insufficient documentation

## 2022-09-07 DIAGNOSIS — E1165 Type 2 diabetes mellitus with hyperglycemia: Secondary | ICD-10-CM | POA: Insufficient documentation

## 2022-09-07 DIAGNOSIS — Z7901 Long term (current) use of anticoagulants: Secondary | ICD-10-CM | POA: Diagnosis not present

## 2022-09-07 LAB — URINALYSIS, ROUTINE W REFLEX MICROSCOPIC
Bacteria, UA: NONE SEEN
Bilirubin Urine: NEGATIVE
Glucose, UA: 500 mg/dL — AB
Ketones, ur: NEGATIVE mg/dL
Nitrite: NEGATIVE
Protein, ur: 30 mg/dL — AB
Specific Gravity, Urine: 1.015 (ref 1.005–1.030)
WBC, UA: >50 WBC/hpf (ref 0–5)
pH: 6.5 (ref 5.0–8.0)

## 2022-09-07 LAB — CBC
HCT: 36.3 % (ref 36.0–46.0)
Hemoglobin: 11.9 g/dL — ABNORMAL LOW (ref 12.0–15.0)
MCH: 29.4 pg (ref 26.0–34.0)
MCHC: 32.8 g/dL (ref 30.0–36.0)
MCV: 89.6 fL (ref 80.0–100.0)
Platelets: 207 10*3/uL (ref 150–400)
RBC: 4.05 MIL/uL (ref 3.87–5.11)
RDW: 14.5 % (ref 11.5–15.5)
WBC: 7.4 10*3/uL (ref 4.0–10.5)
nRBC: 0 % (ref 0.0–0.2)

## 2022-09-07 LAB — BASIC METABOLIC PANEL
Anion gap: 6 (ref 5–15)
BUN: 14 mg/dL (ref 8–23)
CO2: 30 mmol/L (ref 22–32)
Calcium: 9.6 mg/dL (ref 8.9–10.3)
Chloride: 95 mmol/L — ABNORMAL LOW (ref 98–111)
Creatinine, Ser: 1.29 mg/dL — ABNORMAL HIGH (ref 0.44–1.00)
GFR, Estimated: 41 mL/min — ABNORMAL LOW (ref >60)
Glucose, Bld: 315 mg/dL — ABNORMAL HIGH (ref 70–99)
Potassium: 4.4 mmol/L (ref 3.5–5.1)
Sodium: 131 mmol/L — ABNORMAL LOW (ref 135–145)

## 2022-09-07 MED ORDER — CEPHALEXIN 500 MG PO CAPS: 500.0000 mg | ORAL_CAPSULE | Freq: Two times a day (BID) | ORAL | 0 refills | Status: AC

## 2022-09-07 MED ORDER — SODIUM CHLORIDE 0.9 % IV SOLN
1.0000 g | Freq: Once | INTRAVENOUS | Status: AC
  Administered 2022-09-07: 1 g via INTRAVENOUS
  Filled 2022-09-07: qty 10

## 2022-09-07 NOTE — Discharge Instructions (Addendum)
Take antibiotics as prescribed and complete the full course. Recheck with your primary care provider.  Discussed whether or not prophylactic antibiotics are appropriate for you.

## 2022-09-07 NOTE — ED Triage Notes (Signed)
Patient here POV from Home.  Endorses Frequency and Dysuria that began 1 Month ago. Has been taking Ciprofloxacin recently. Some nausea. No Emesis or Diarrhea. No Fevers.   NAD Noted during Triage. A&Ox4. GCS 15. BIB Wheelchair.

## 2022-09-07 NOTE — ED Provider Notes (Signed)
[place] EMERGENCY DEPARTMENT AT [facility] Provider Note   CSN: [id number] Arrival date & time: 09/07/22  1509     History  Chief Complaint  Patient presents with   Dysuria    [name] is a 82 y.o. female.  82 year old female brought in by granddaughter for concern for UTI, history of frequent UTI and prior urosepsis.  Recently treated for with Cipro has completed the medication and feels like dysuria and frequency are coming back.  No fevers, no vomiting, no changes in bowel habits.  Past medical history of Alzheimer's dementia, type 2 diabetes mellitus, CKD 3, Charcot joints of bilateral feet, anxiety.       Home Medications Prior to Admission medications   Medication Sig Start Date End Date Taking? Authorizing Provider  cephALEXin (KEFLEX) 500 MG capsule Take 1 capsule (500 mg total) by mouth 2 (two) times daily for 5 days. 09/07/22 09/12/22 Yes Jeannie Fend, PA-C  ALPRAZolam Prudy Feeler) 0.25 MG tablet Take 1-2 tablets (0.25-0.5 mg total) by mouth 2 (two) times daily as needed for anxiety. 04/17/21   Malachy Mood, MD  amitriptyline (ELAVIL) 25 MG tablet Take 1 tablet (25 mg total) by mouth at bedtime. 08/20/21   Ghimire, Werner Lean, MD  Biotin 40981 MCG TABS Take 1 tablet by mouth every evening.    [provider]  Blood Glucose Monitoring Suppl (FREESTYLE LITE) w/Device KIT use as directed 07/21/20   Darlin Drop, DO  bumetanide (BUMEX) 2 MG tablet Take 1 tablet (2 mg total) by mouth daily. 02/25/22   Dorcas Carrow, MD  Cholecalciferol (VITAMIN D3) 50 MCG (2000 UT) capsule Take 2,000 Units by mouth every evening.    [provider]  Coenzyme Q10 (COQ10 PO) Take 1 capsule by mouth every evening.    [provider]  COLCRYS 0.6 MG tablet TAKE 1 TABLET DAILY AS NEEDED FOR GOUT FLARE. Patient taking differently: Take 0.6 mg by mouth daily as needed (gout flare up). 12/27/18   Nadara Mustard, MD  CRANBERRY PO Take 4,200 mg by mouth every  evening.    [provider]  ELIQUIS 5 MG TABS tablet Take 5 mg by mouth 2 (two) times daily. 07/13/21   [provider]  febuxostat (ULORIC) 40 MG tablet Take 40 mg by mouth daily. 12/31/19   [provider]  fluticasone (FLONASE) 50 MCG/ACT nasal spray Place 1 spray into both nostrils daily. 02/26/22   Dorcas Carrow, MD  gabapentin (NEURONTIN) 300 MG capsule Take 300 mg by mouth at bedtime. 12/18/19   [provider]  glipiZIDE (GLUCOTROL XL) 2.5 MG 24 hr tablet Take 2.5 mg by mouth daily with breakfast.    [provider]  glucose blood (FREESTYLE LITE) test strip Test blood sugars up to 4 times daily 07/21/20   Darlin Drop, DO  insulin glargine (LANTUS) 100 UNIT/ML Solostar Pen Inject 5 Units into the skin daily. Patient taking differently: Inject 25 Units into the skin daily. 07/21/20   Darlin Drop, DO  Insulin Pen Needle (PENTIPS) 32G X 4 MM MISC Use as directed 07/21/20   Darlin Drop, DO  Insulin Pen Needle 32G X 4 MM MISC 5 Units by Does not apply route in the morning and at bedtime. 07/21/20   Darlin Drop, DO  insulin regular (NOVOLIN R) 100 units/mL injection Inject into the skin 3 (three) times daily before meals. Started last week 4/28: Sliding scale before meals: <150 none,  151-199 - 2 units                                                                            200-249 - 4 units                                                                            250-299 - 6 units                                                                            300-349 - 8 units                                                                            350> 10 units    [provider]  JANUVIA 25 MG tablet Take 25 mg by mouth daily. 12/26/19   [provider]  Boris Lown Oil 500 MG CAPS Take 500 mg by mouth daily.    [provider]  Lancets (FREESTYLE)  lancets Test blood sugars up to 4 times daily 07/21/20   Darlin Drop, DO  memantine (NAMENDA) 10 MG tablet Take 1 tablet (10 mg total) by mouth 2 (two) times daily. 10/06/21   Micki Riley, MD  Methylfol-Algae-B12-Acetylcyst (CEREFOLIN NAC PO) Take 1 tablet by mouth in the morning.    [provider]  metolazone (ZAROXOLYN) 2.5 MG tablet Take 2.5 mg by mouth See admin instructions. One tablet by mouth Monday and Thursday 30 min before Bumex 09/21/21   [provider]  metoprolol succinate (TOPROL-XL) 25 MG 24 hr tablet Take 25 mg by mouth at bedtime.  12/27/19   [provider]  Multiple Vitamin (MULTIVITAMIN WITH MINERALS) TABS tablet Take 1 tablet by mouth daily. 03/03/20   Lorin Glass, MD  nitroGLYCERIN (NITROLINGUAL) 0.4 MG/SPRAY spray Place 1 spray under the tongue every 5 (five) minutes x 3 doses as needed for chest pain.    [provider]  pantoprazole (PROTONIX) 40 MG tablet Take 1 tablet (40 mg total) by mouth 2 (two) times daily before a meal. 03/02/20   Dahal, Melina Schools, MD  potassium chloride SA (KLOR-CON M) 20 MEQ tablet Take 20 mEq by mouth daily.    [provider]  pramipexole (MIRAPEX) 0.25 MG tablet TAKE 1 TO 2 TABLETS AS INSTRUCTED, 1 TABLET AT 4:00 P.M. AND 2 TABLETS AT BEDTIME Patient  taking differently: See admin instructions. Take one tablet by mouth at 1 4 pm and 2 tablets at bedtime. 09/27/19   Ihor Austin, NP  rosuvastatin (CRESTOR) 20 MG tablet Take 20 mg by mouth daily.    [provider]      Allergies    Nsaids, Reglan [metoclopramide], Ultram [tramadol], Lipitor [atorvastatin], Lyrica [pregabalin], and Urecholine [bethanechol]    Review of Systems   Review of Systems Level 5 caveat for poor historian/Alzheimer's Physical Exam Updated Vital Signs BP (!) 156/90   Pulse 85   Temp 98 F (36.7 C)   Resp 16   Ht 5\' 6"  (1.676 m)   Wt 76.2 kg   SpO2 100%   BMI 27.12 kg/m  Physical Exam Vitals and nursing  note reviewed.  Constitutional:      General: [name] is not in acute distress.    Appearance: [name] is well-developed. [name] is not diaphoretic.  HENT:     Head: Normocephalic and atraumatic.     Mouth/Throat:     Mouth: Mucous membranes are moist.  Eyes:     Conjunctiva/sclera: Conjunctivae normal.  Cardiovascular:     Rate and Rhythm: Normal rate and regular rhythm.     Pulses: Normal pulses.     Heart sounds: Normal heart sounds.  Pulmonary:     Effort: Pulmonary effort is normal.     Breath sounds: Normal breath sounds.  Abdominal:     Palpations: Abdomen is soft.     Tenderness: There is no abdominal tenderness. There is no right CVA tenderness or left CVA tenderness.  Musculoskeletal:     Cervical back: Neck supple.     Right lower leg: Edema present.     Left lower leg: Edema present.  Skin:    General: Skin is warm and dry.  Neurological:     General: No focal deficit present.     Mental Status: [name] is alert.  Psychiatric:        Behavior: Behavior normal.     ED Results / Procedures / Treatments   Labs (all labs ordered are listed, but only abnormal results are displayed) Labs Reviewed  URINALYSIS, ROUTINE W REFLEX MICROSCOPIC - Abnormal; Notable for the following components:      Result Value   APPearance HAZY (*)    Glucose, UA 500 (*)    Hgb urine dipstick MODERATE (*)    Protein, ur 30 (*)    Leukocytes,Ua LARGE (*)    All other components within normal limits  BASIC METABOLIC PANEL - Abnormal; Notable for the following components:   Sodium 131 (*)    Chloride 95 (*)    Glucose, Bld 315 (*)    Creatinine, Ser 1.29 (*)    GFR, Estimated 41 (*)    All other components within normal limits  CBC - Abnormal; Notable for the following components:   Hemoglobin 11.9 (*)    All other components within normal limits  URINE CULTURE    EKG None  Radiology No results found.  Procedures Procedures    Medications Ordered in ED Medications  cefTRIAXone  (ROCEPHIN) 1 g in sodium chloride 0.9 % 100 mL IVPB (0 g Intravenous Stopped 09/07/22 1742)    ED Course/ Medical Decision Making/ A&P                             Medical Decision Making Amount and/or Complexity of Data Reviewed Labs: ordered.  This patient presents to the ED for concern of dysuria, urgency, this involves an extensive number of treatment options, and is a complaint that carries with it a high risk of complications and morbidity.  The differential diagnosis includes but not limited to UTI, pyelonephritis   Co morbidities that complicate the patient evaluation  Frequent urinary tract infections, urosepsis, anxiety, hypertension, GERD, Alzheimer's, CKD, breast cancer, HLD, diabetes   Additional history obtained:  Additional history obtained from granddaughter at bedside who contributes to history as above External records from outside source obtained and reviewed including prior urine culture on file through Care Everywhere with Klebsiella, pansensitive.   Lab Tests:  I Ordered, and personally interpreted labs.  The pertinent results include: CBC with normal WBC.  BMP with elevated glucose at 315, improved kidney function with creatinine 1.29 today.  Urinalysis with moderate hemoglobin, protein, large leukocytes with greater than 50 white cells although no bacteria seen.  Add on urine culture sent out.   Consultations Obtained:  I requested consultation with the ER attending, Dr. Silverio Lay,  and discussed lab and imaging findings as well as pertinent plan - they recommend: Rocephin in the ER, discharged on Keflex.   Problem List / ED Course / Critical interventions / Medication management  82 year old female brought in by granddaughter with concern for UTI.  No fever, no vomiting.  Well-appearing on exam, abdomen is soft and nontender.  Has baseline lower extremity edema.  Found to have likely urinary tract infection with large leukocytes although no bacteria seen, will  add on urine culture.  White count normal.  Prior culture report reviewed, pansensitive, Klebsiella.  Will treat with Rocephin, discharged on Keflex.  Recommend close follow-up with PCP.  Patient does periodically self cath or [name] husband helps [name] cath at home, may need prophylaxis as [name] feels [name] does not completely empty [name] bladder and is having to self cath. I ordered medication including rocephin  for UTI  Reevaluation of the patient after these medicines showed that the patient improved I have reviewed the patients home medicines and have made adjustments as needed   Social Determinants of Health:  Lives with husband, grandaughter lives near by and cares for [name]   Test / Admission - Considered:  Stable for DC with close follow up         Final Clinical Impression(s) / ED Diagnoses Final diagnoses:  Cystitis    Rx / DC Orders ED Discharge Orders          Ordered    cephALEXin (KEFLEX) 500 MG capsule  2 times daily        09/07/22 1750              Jeannie Fend, PA-C 09/07/22 1758    Charlynne Pander, MD 09/07/22 [phone]

## 2022-09-09 LAB — URINE CULTURE: Culture: >=100000 — AB

## 2022-09-10 ENCOUNTER — Other Ambulatory Visit (HOSPITAL_COMMUNITY): Payer: Self-pay

## 2022-09-10 ENCOUNTER — Telehealth (HOSPITAL_BASED_OUTPATIENT_CLINIC_OR_DEPARTMENT_OTHER): Payer: Self-pay | Admitting: Emergency Medicine

## 2022-09-10 NOTE — Telephone Encounter (Signed)
Post ED Visit - Positive Culture Follow-up: Successful Patient Follow-Up  Culture assessed and recommendations reviewed by:  []  Enzo Bi, Pharm.D. []  Celedonio Miyamoto, Pharm.D., BCPS AQ-ID []  Garvin Fila, Pharm.D., BCPS []  Georgina Pillion, Pharm.D., BCPS []  [place], [address].D., BCPS, AAHIVP []  Estella Husk, Pharm.D., BCPS, AAHIVP []  Lysle Pearl, PharmD, BCPS []  Phillips Climes, PharmD, BCPS []  Agapito Games, PharmD, BCPS [x]  Andreas Ohm, PharmD  Positive urine culture  []  Patient discharged without antimicrobial prescription and treatment is now indicated [x]  Organism is resistant to prescribed ED discharge antimicrobial []  Patient with positive blood cultures  Changes discussed with ED provider: Alvester Chou, MD New antibiotic prescription: Macrobid 100 mg BID x five days Called to [facility] [address]) [phone]  Contacted patient, date 09/10/2022, time 0915   Pamala Hurry 09/10/2022, 4:18 PM

## 2022-09-10 NOTE — TOC Benefit Eligibility Note (Signed)
Pharmacy Patient Advocate Encounter  Insurance verification completed.    The patient is insured through [employer] test claim for linezolid (Zyvox) 600 mg tablet and the current 7 day co-pay is $16.00.   This test claim was processed through [employer]- copay amounts may vary at other pharmacies due to [employer], or as the patient moves through the different stages of their insurance plan.    Roland Earl, CPHT Pharmacy Patient Advocate Specialist [facility] Patient Advocate Team Direct Number: [phone]  Fax: [phone]

## 2022-09-10 NOTE — Progress Notes (Signed)
ED Antimicrobial Stewardship Positive Culture Follow Up   [name] is an 82 y.o. female who presented to [facility] on 09/07/2022 with a chief complaint of dysuria.  Chief Complaint  Patient presents with   Dysuria    Recent Results (from the past 720 hour(s))  Urine Culture     Status: Abnormal   Collection Time: 09/07/22  5:07 PM   Specimen: In/Out Cath Urine  Result Value Ref Range Status   Specimen Description   Final    IN/OUT CATH URINE Performed at [facility], [address] [place], [place] [address]    Special Requests   Final    NONE Performed at [facility], [address] [place], [place] [address]    Culture >=100,000 COLONIES/mL ENTEROCOCCUS FAECIUM (A)  Final   Report Status 09/09/2022 FINAL  Final   Organism ID, Bacteria ENTEROCOCCUS FAECIUM (A)  Final      Susceptibility   Enterococcus faecium - MIC*    AMPICILLIN 16 RESISTANT Resistant     NITROFURANTOIN <=16 SENSITIVE Sensitive     VANCOMYCIN <=0.5 SENSITIVE Sensitive     * >=100,000 COLONIES/mL ENTEROCOCCUS FAECIUM    [x]  Treated with cephalexin, organism resistant to prescribed antimicrobial  New antibiotic prescription: Macrobid 100 mg po BID x5 days   ED Provider: Alvester Chou, MD   Andreas Ohm 09/10/2022, 7:53 AM Clinical Pharmacist Monday - Friday phone -  [phone] Saturday - Sunday phone - [phone]

## 2022-11-05 IMAGING — CR DG CHEST 2V
2 series · 2 of 2 positions shown · non-contrast
Comparison: 07/21/2020

CLINICAL DATA: Shortness of breath and fevers, initial encounter

EXAM:
CHEST - 2 VIEW

[w chest lat]
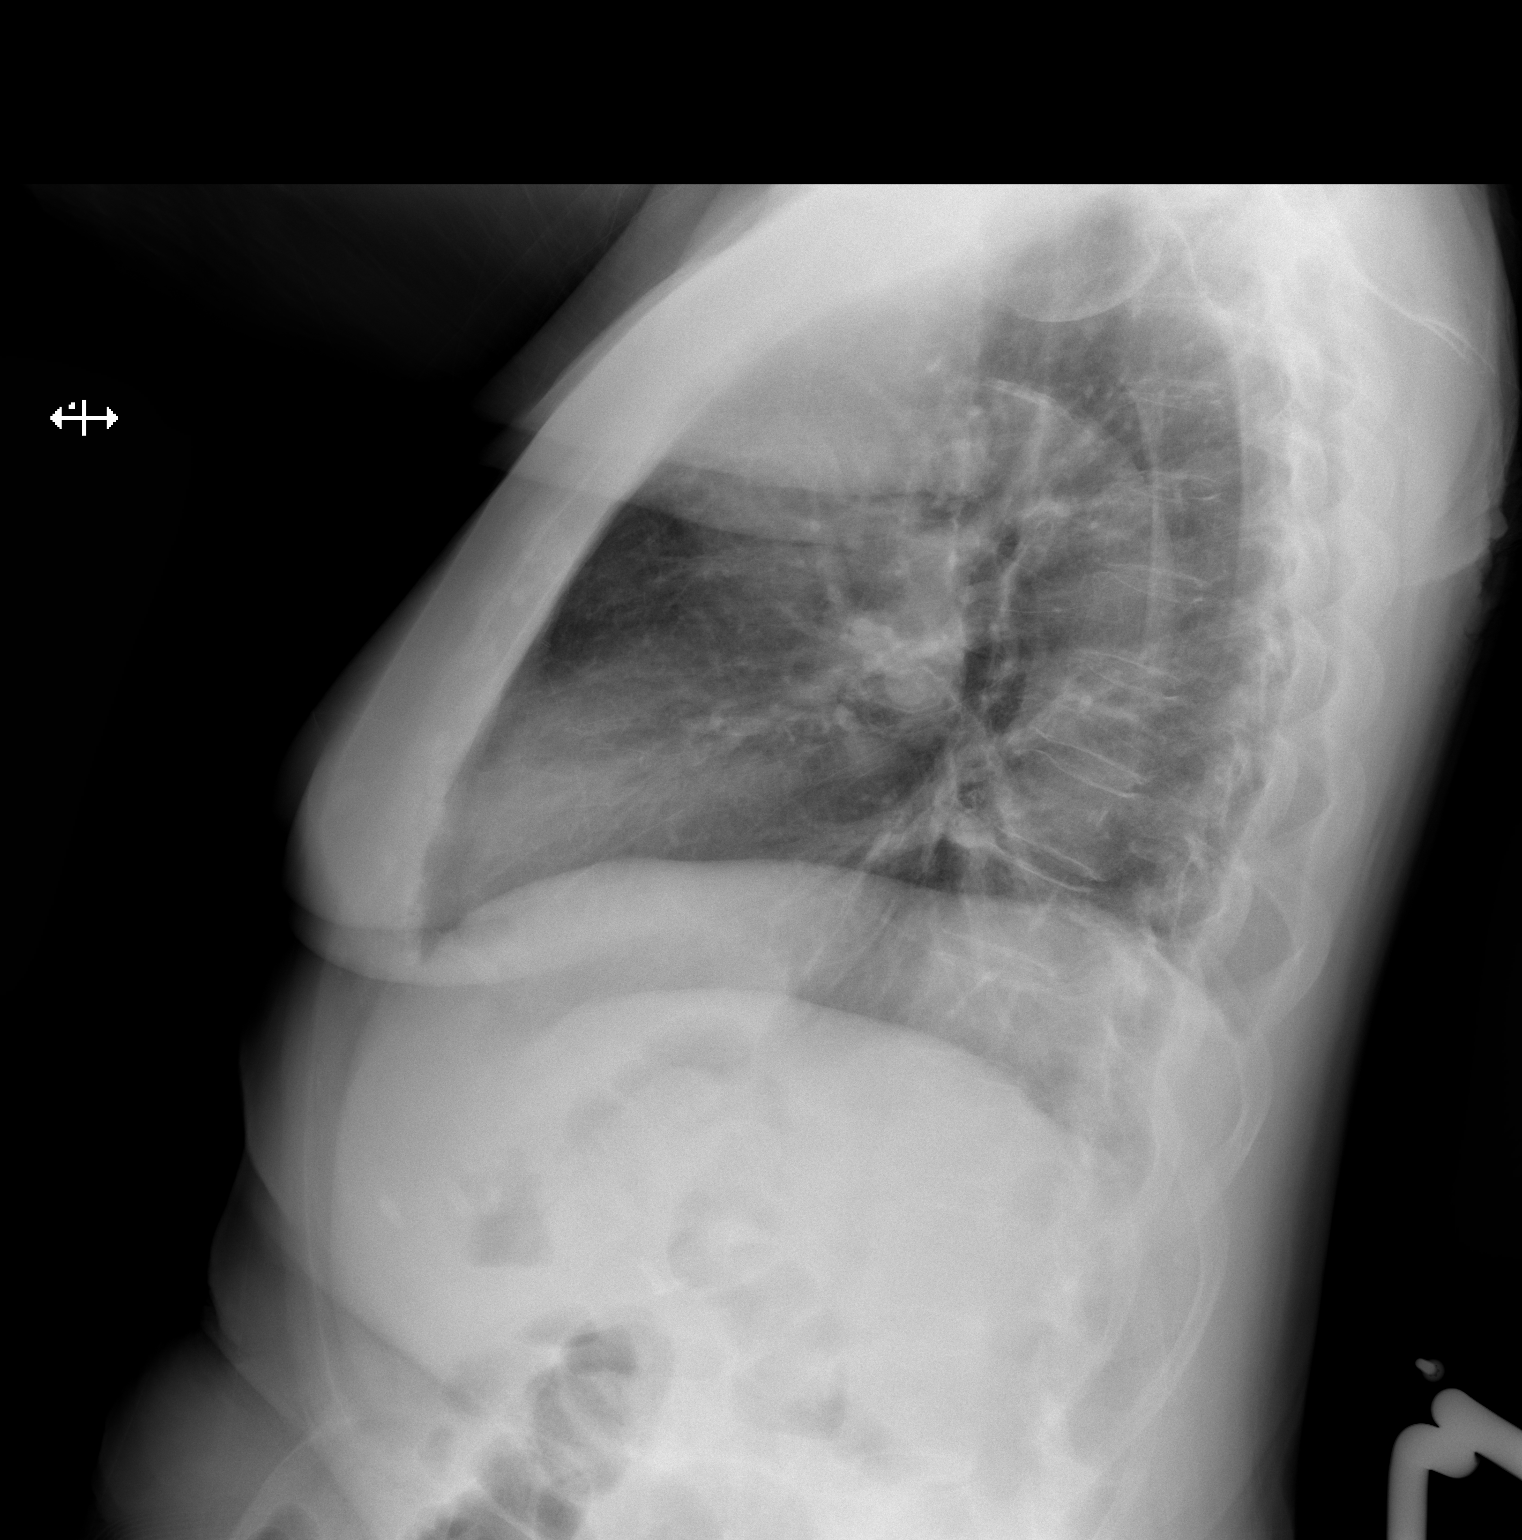

[w chest decub]
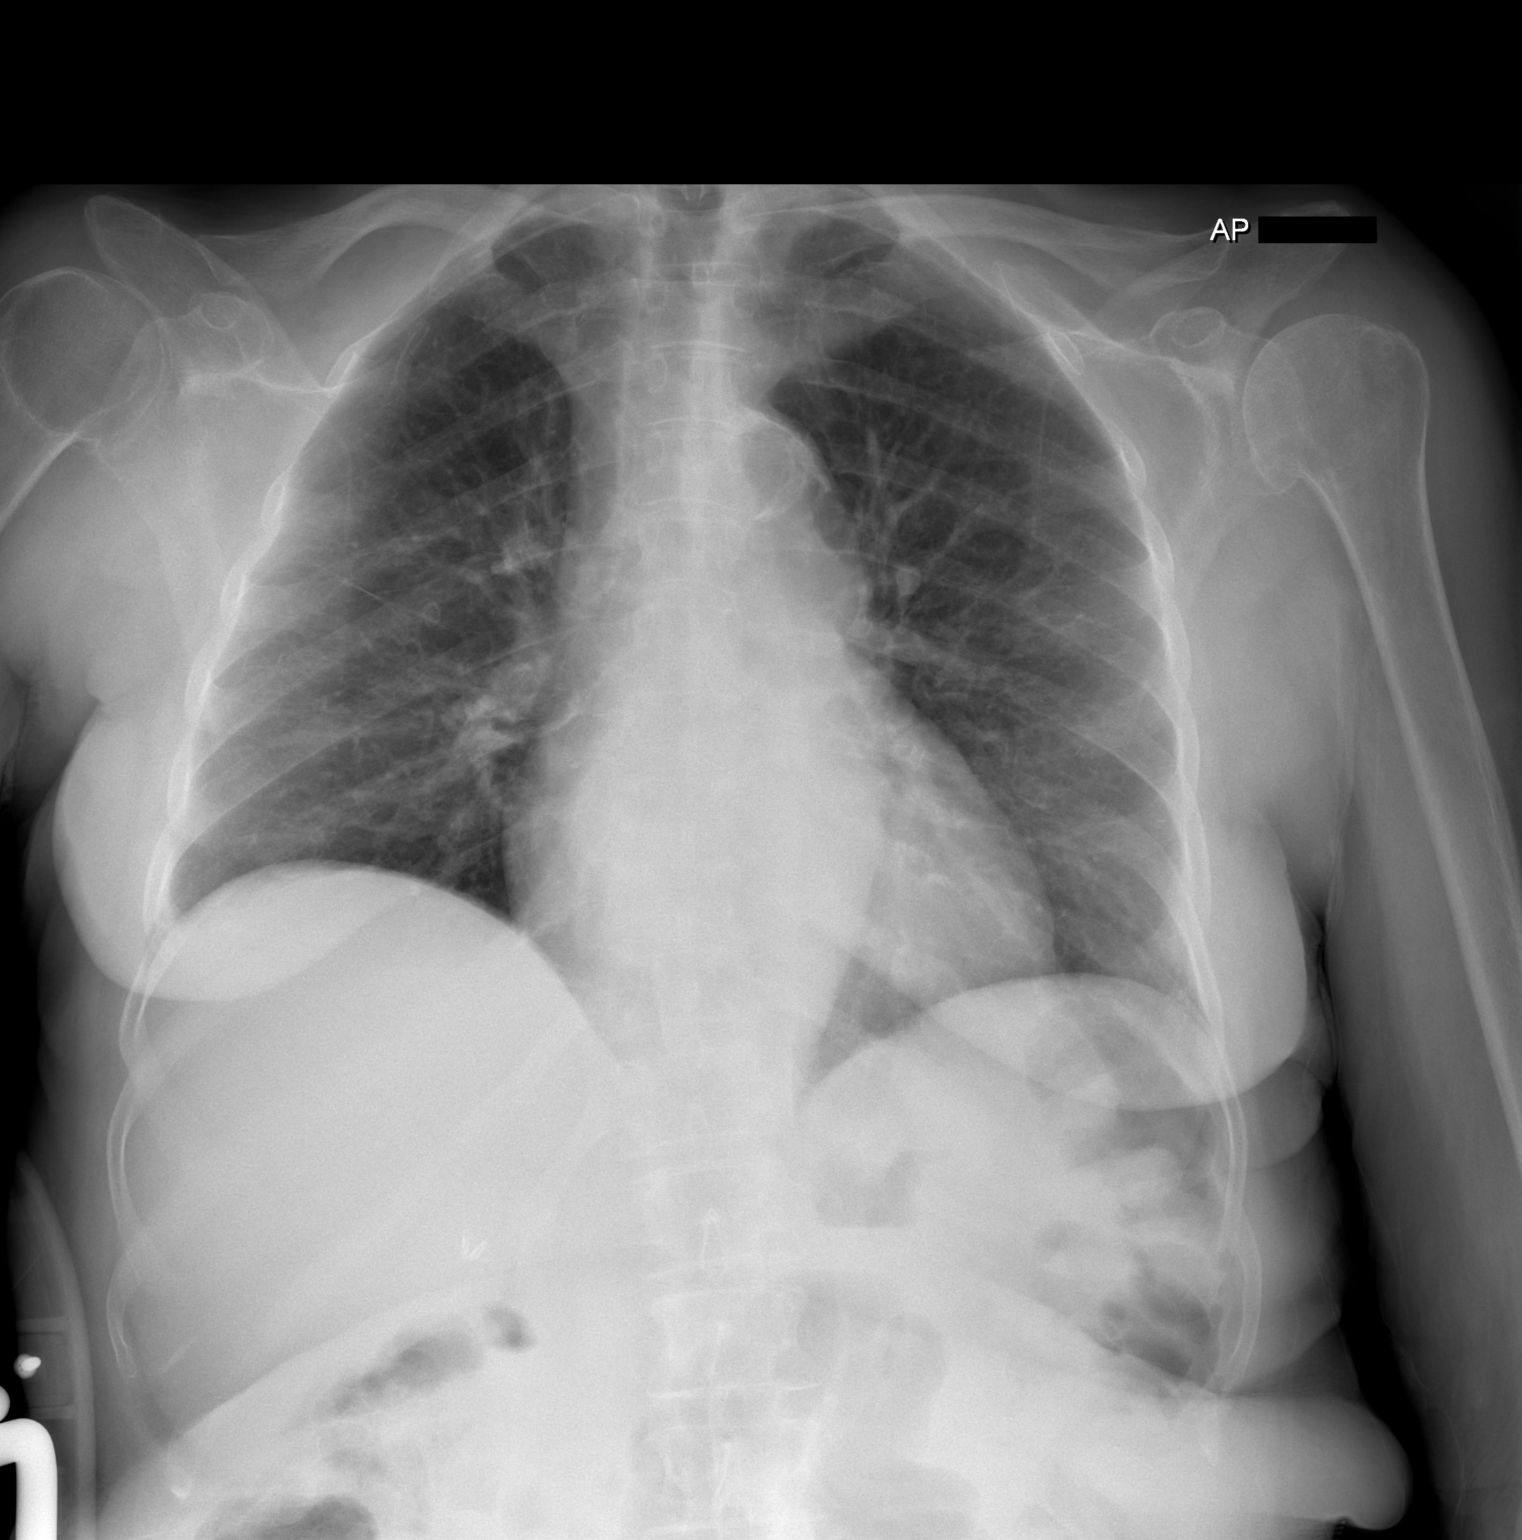

[2 of 2 positions shown; findings below may reference images not displayed]

FINDINGS: Cardiac shadow is stable. Aortic calcifications are again seen.
Lungs are well aerated bilaterally. No focal infiltrate or sizable
effusion is seen. No bony abnormality is noted.
IMPRESSION: No acute abnormality noted.

## 2022-11-18 IMAGING — MG DIGITAL DIAGNOSTIC BILAT W/ TOMO W/ CAD
8 series · 9 of 24 positions shown · non-contrast
Comparison: Previous exam(s).

CLINICAL DATA: Patient presents for right breast pain.

EXAM:
DIGITAL DIAGNOSTIC BILATERAL MAMMOGRAM WITH TOMOSYNTHESIS AND CAD;
ULTRASOUND RIGHT BREAST LIMITED
TECHNIQUE: Bilateral digital diagnostic mammography and breast tomosynthesis
was performed. The images were evaluated with computer-aided
detection.; Targeted ultrasound examination of the right breast was
performed

[L MLO synth-2D]
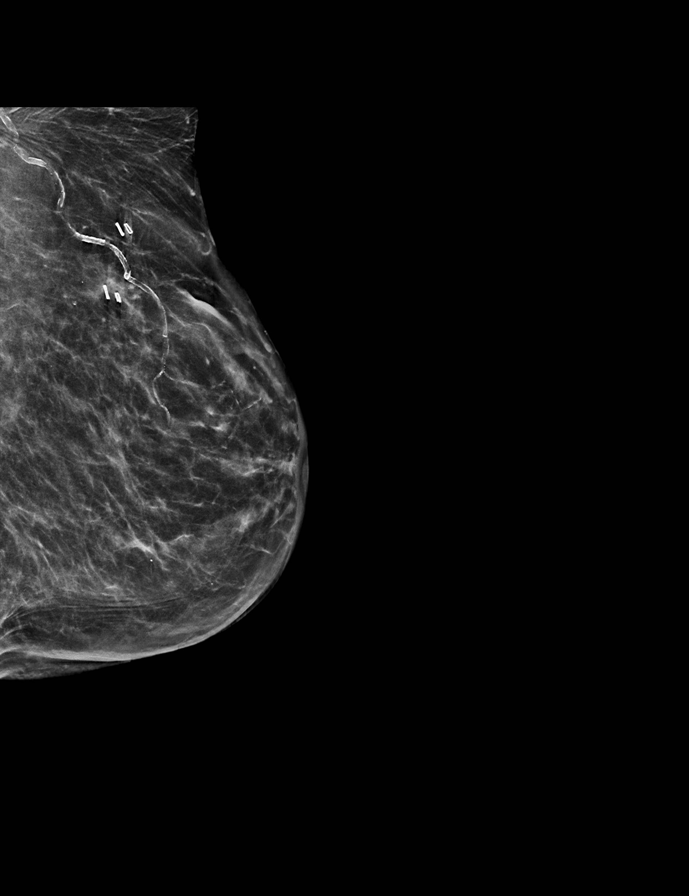

[L CC synth-2D]
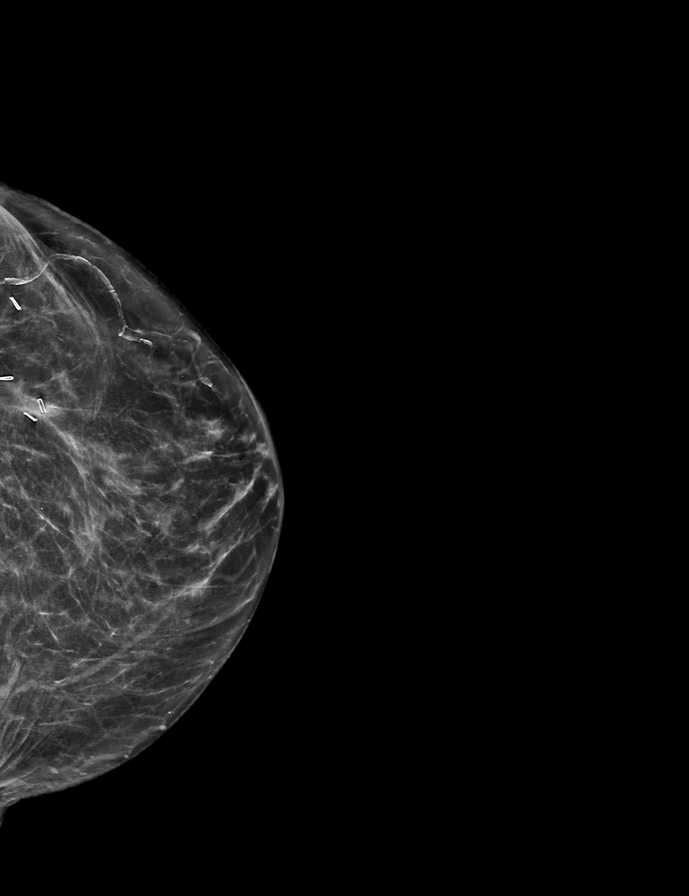

[R MLO synth-2D]
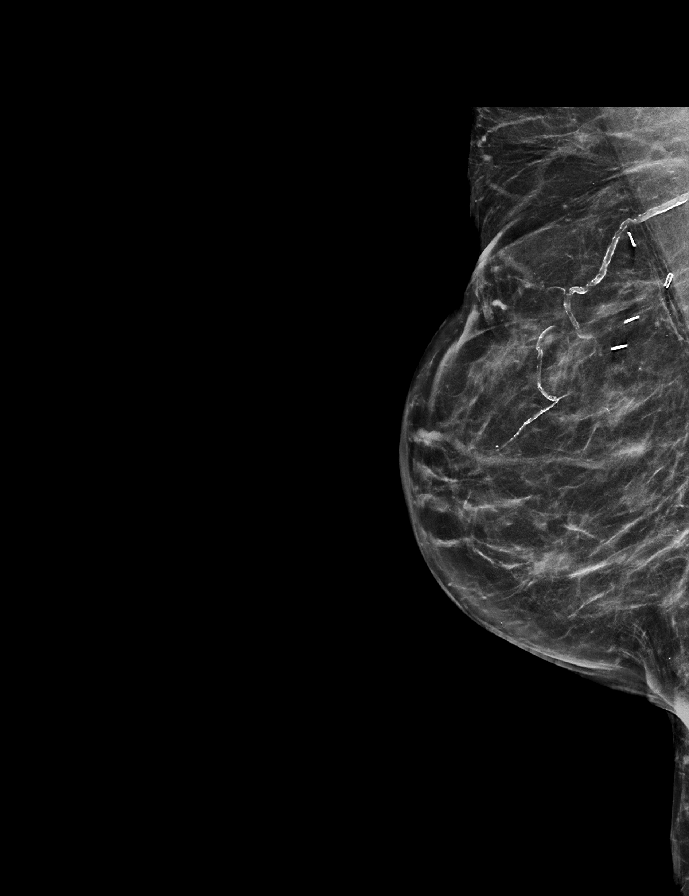

[R CC synth-2D]
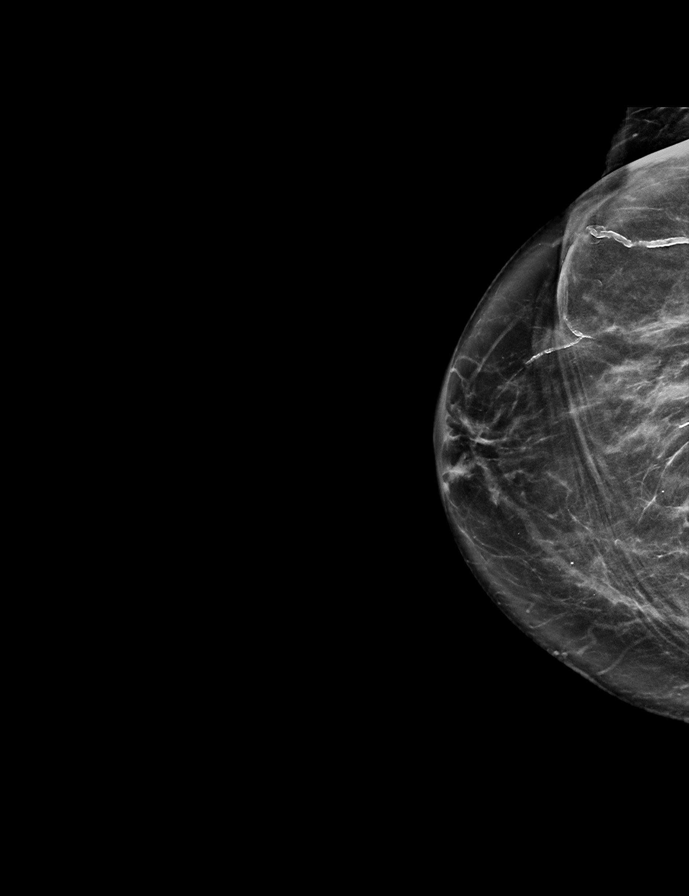

[R MLO tomo · 2 of 61 frames shown]
[frame 20/61]
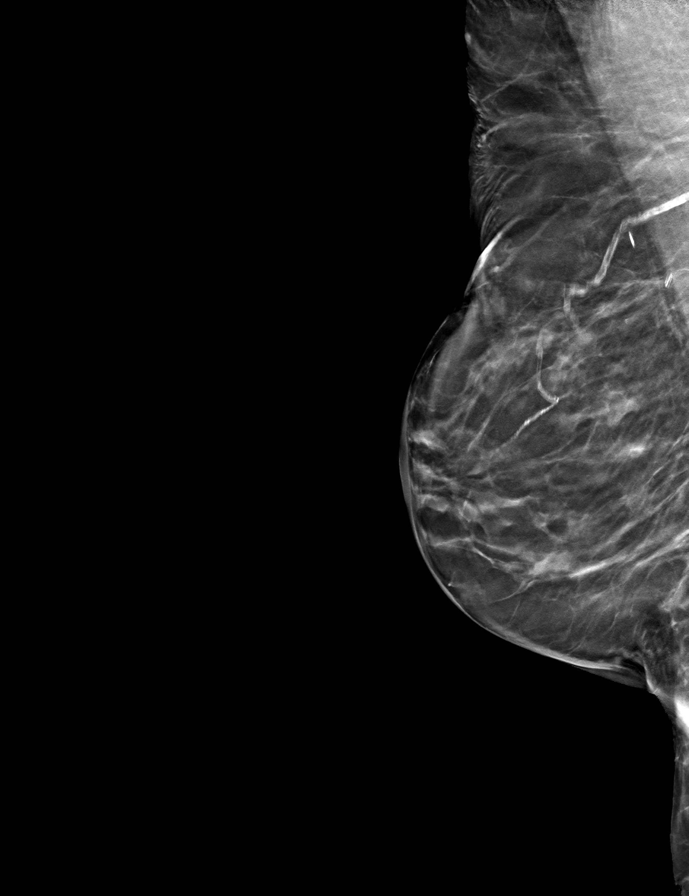
[frame 31/61]
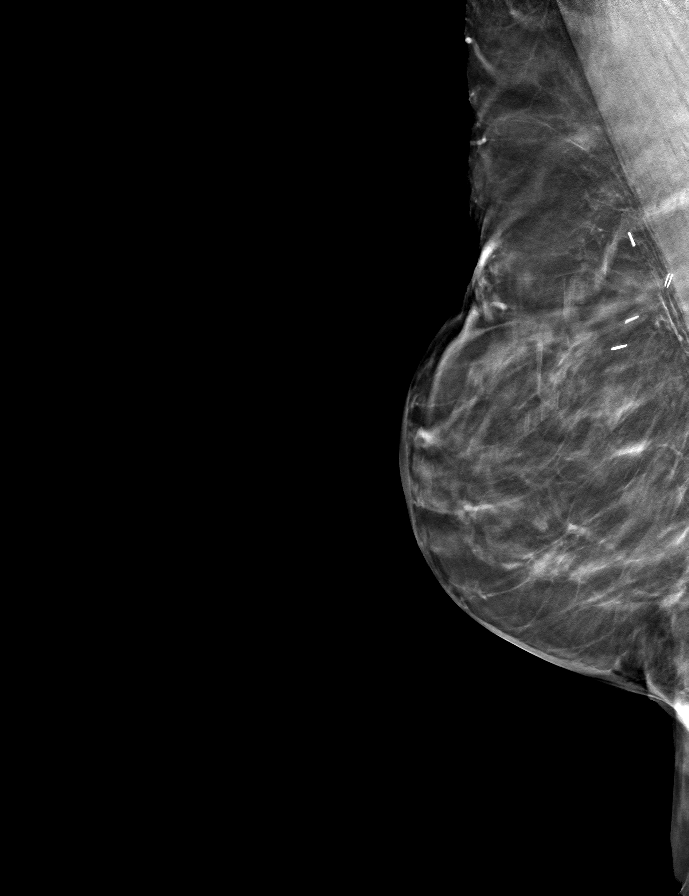

[L MLO tomo · tomo slice 29/57.0]
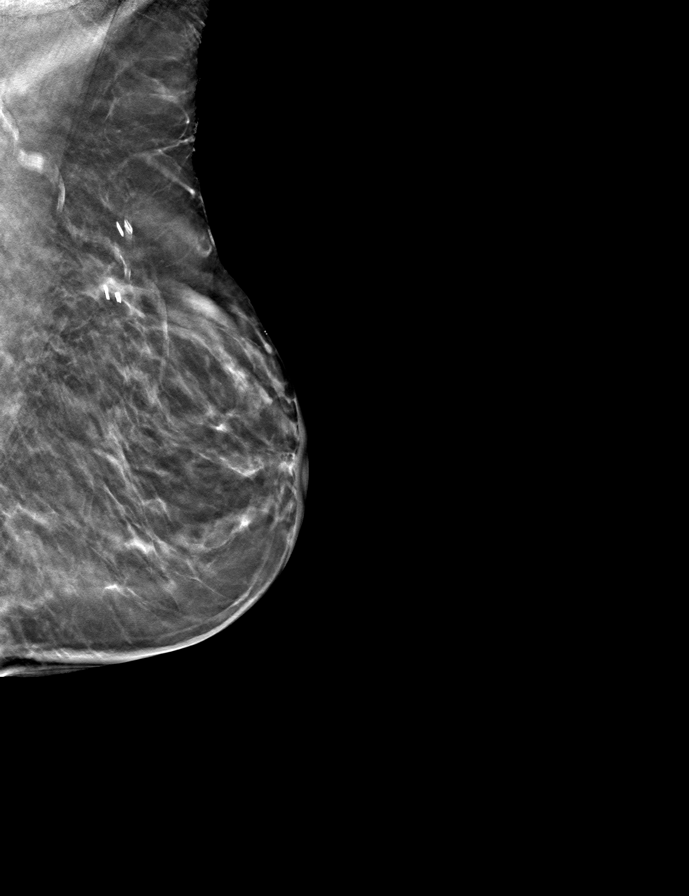

[R CC tomo · tomo slice 32/63.0]
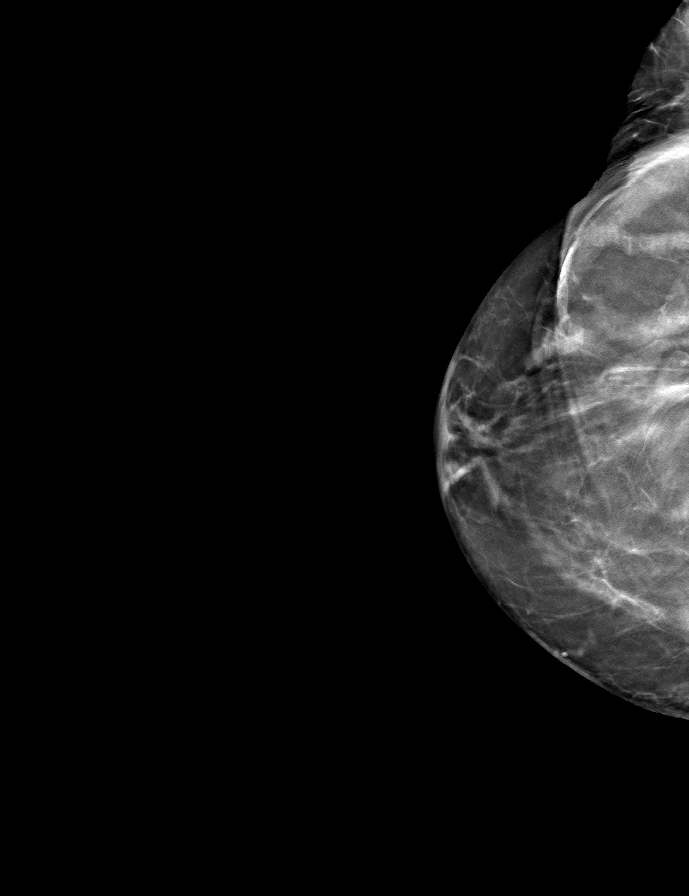

[L CC tomo · tomo slice 29/56.0]
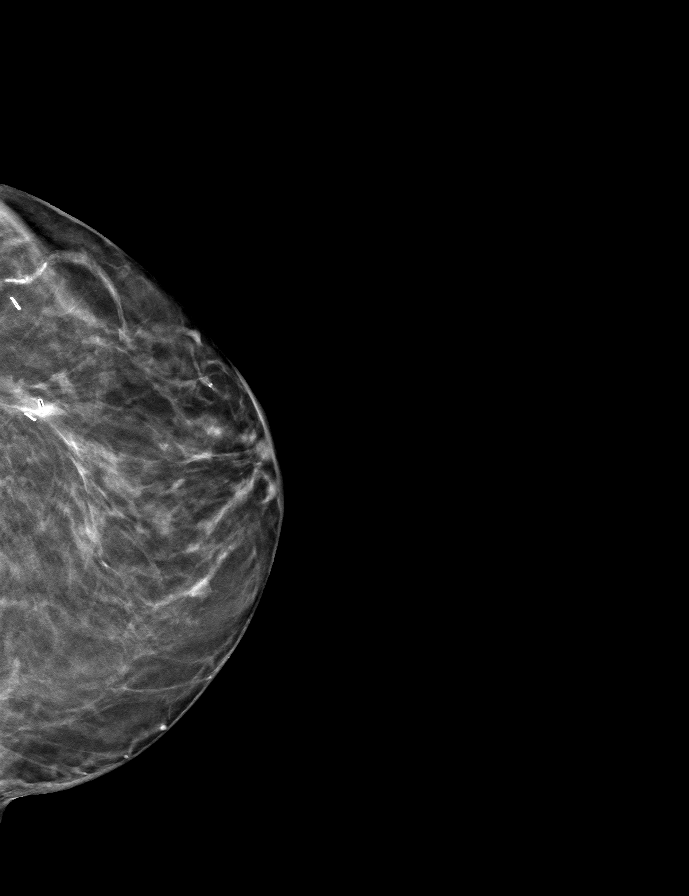

[9 of 24 positions shown; findings below may reference images not displayed]

ACR Breast Density Category c: The breast tissue is heterogeneously
dense, which may obscure small masses.
FINDINGS: No concerning masses, calcifications or nonsurgical distortion
identified within either breast. Bilateral postlumpectomy changes
are demonstrated.

On physical exam, no discrete mass is palpated within the right
breast.

Targeted ultrasound is performed, showing normal tissue without
suspicious mass within the right breast and right axilla.
IMPRESSION: No mammographic evidence for malignancy.

RECOMMENDATION:
Continued clinical evaluation for right breast tenderness.

Screening mammogram in one year.(Code:J9-N-P1Q)

I have discussed the findings and recommendations with the patient.
If applicable, a reminder letter will be sent to the patient
regarding the next appointment.

BI-RADS CATEGORY  2: Benign.

## 2022-11-18 IMAGING — US US BREAST*R* LIMITED INC AXILLA
1 series · 6 of 6 positions shown · non-contrast
Comparison: Previous exam(s).

CLINICAL DATA: Patient presents for right breast pain.

EXAM:
DIGITAL DIAGNOSTIC BILATERAL MAMMOGRAM WITH TOMOSYNTHESIS AND CAD;
ULTRASOUND RIGHT BREAST LIMITED
TECHNIQUE: Bilateral digital diagnostic mammography and breast tomosynthesis
was performed. The images were evaluated with computer-aided
detection.; Targeted ultrasound examination of the right breast was
performed

[Series 1: us breast*right* limited inc axilla · 0.07mm/px · 6 of 6 slices shown]
[im 1/6]
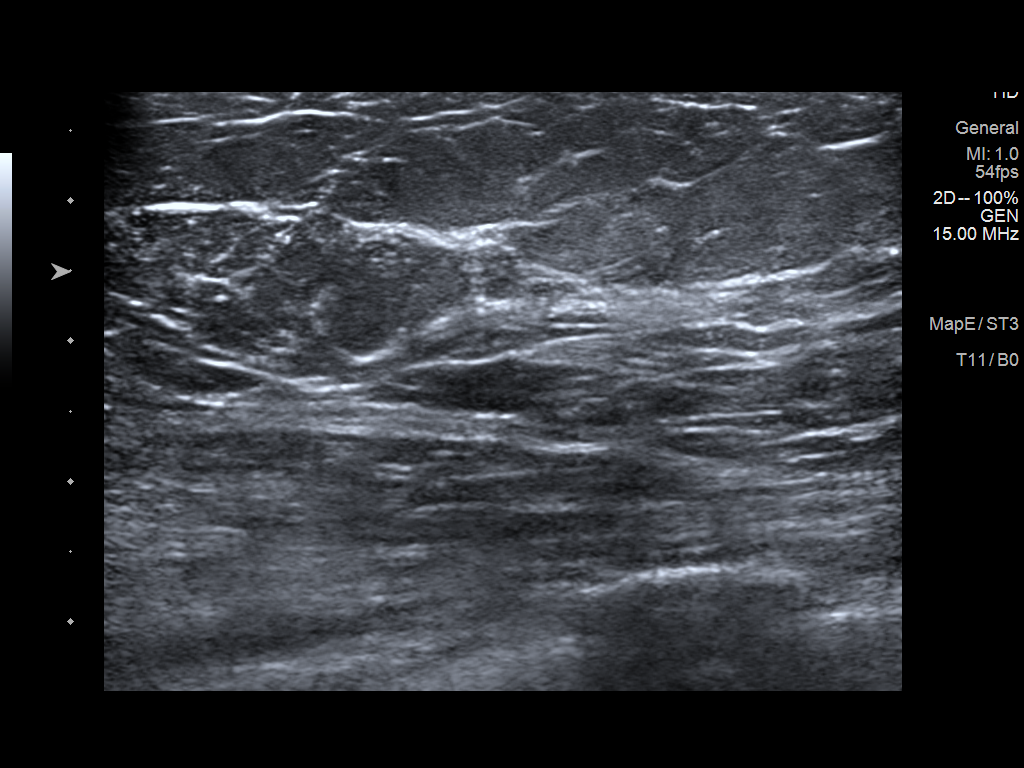
[im 2/6]
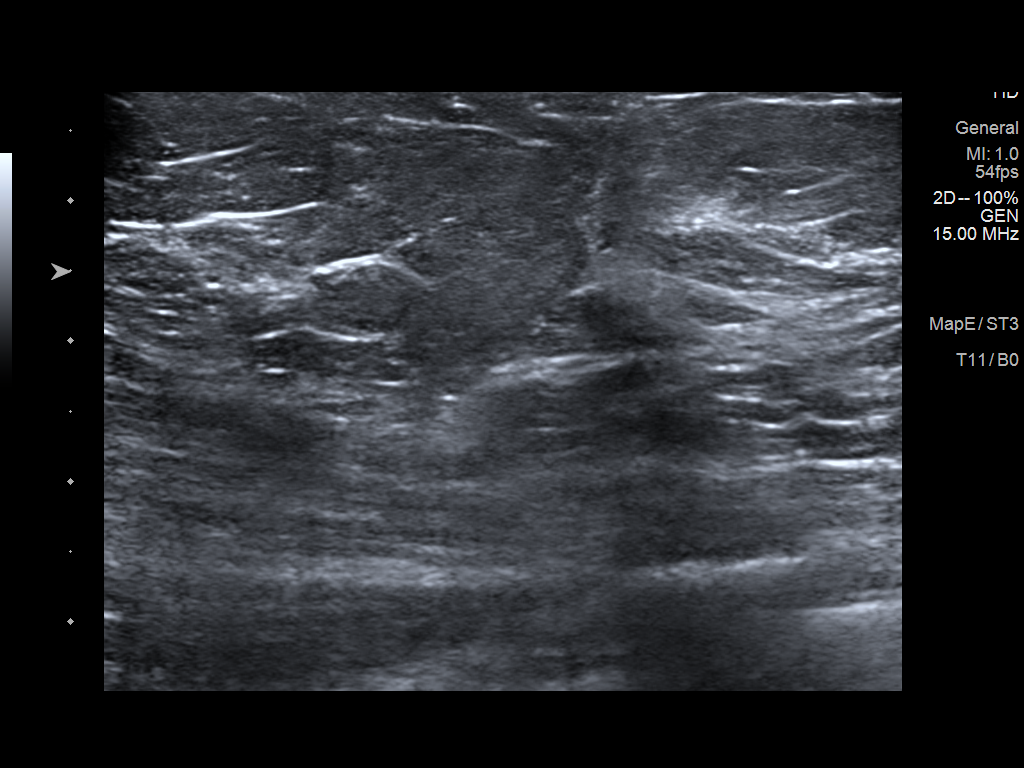
[im 3/6]
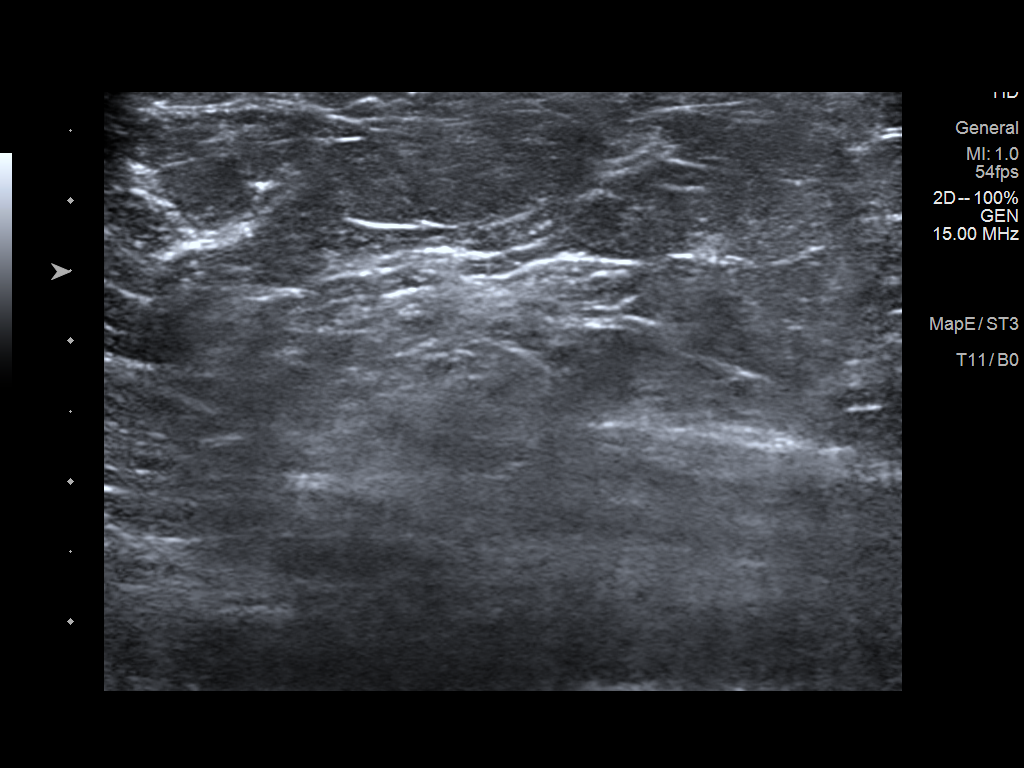
[im 4/6]
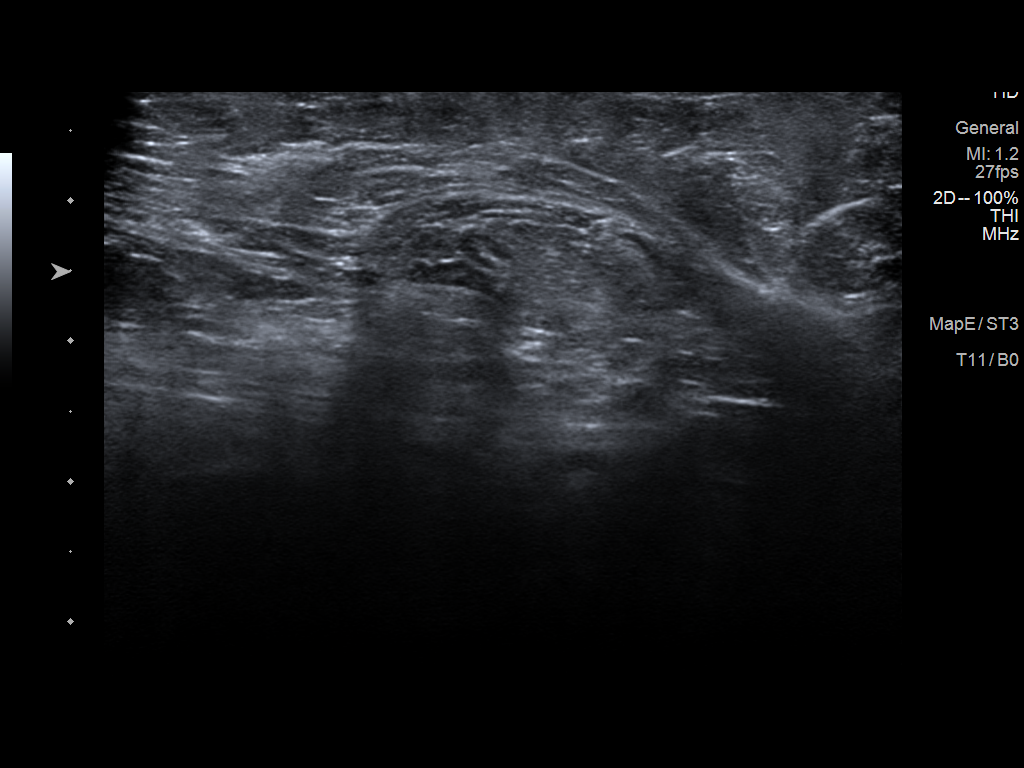
[im 5/6]
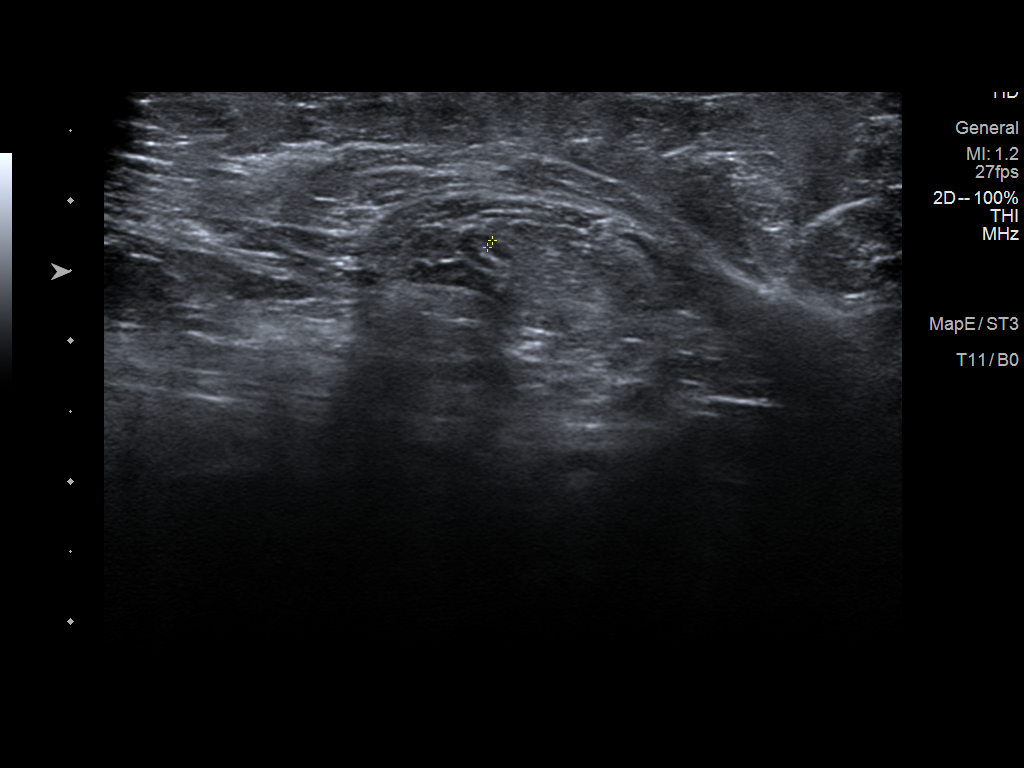
[im 6/6]
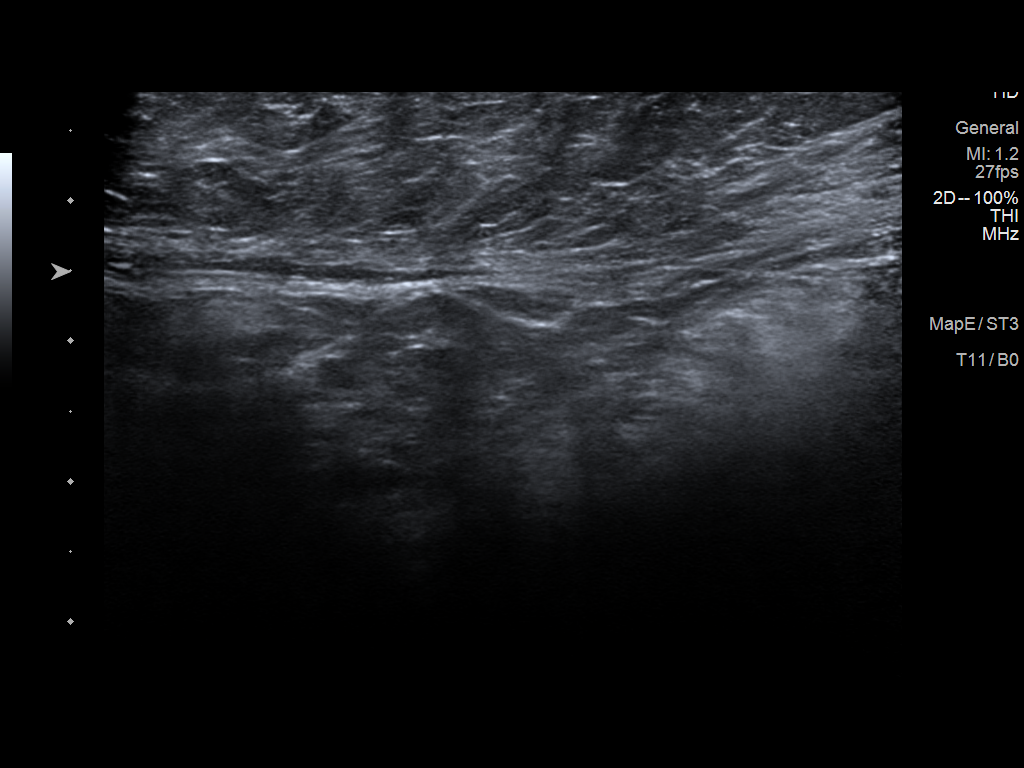

[6 of 6 positions shown; findings below may reference images not displayed]

ACR Breast Density Category c: The breast tissue is heterogeneously
dense, which may obscure small masses.
FINDINGS: No concerning masses, calcifications or nonsurgical distortion
identified within either breast. Bilateral postlumpectomy changes
are demonstrated.

On physical exam, no discrete mass is palpated within the right
breast.

Targeted ultrasound is performed, showing normal tissue without
suspicious mass within the right breast and right axilla.
IMPRESSION: No mammographic evidence for malignancy.

RECOMMENDATION:
Continued clinical evaluation for right breast tenderness.

Screening mammogram in one year.(Code:J9-N-P1Q)

I have discussed the findings and recommendations with the patient.
If applicable, a reminder letter will be sent to the patient
regarding the next appointment.

BI-RADS CATEGORY  2: Benign.

## 2023-02-09 IMAGING — DX DG CHEST 1V PORT
1 series · 1 of 1 positions shown · non-contrast
Comparison: Chest radiograph dated 05/12/2021.

CLINICAL DATA: Altered mental status.

EXAM:
PORTABLE CHEST 1 VIEW

[chest]
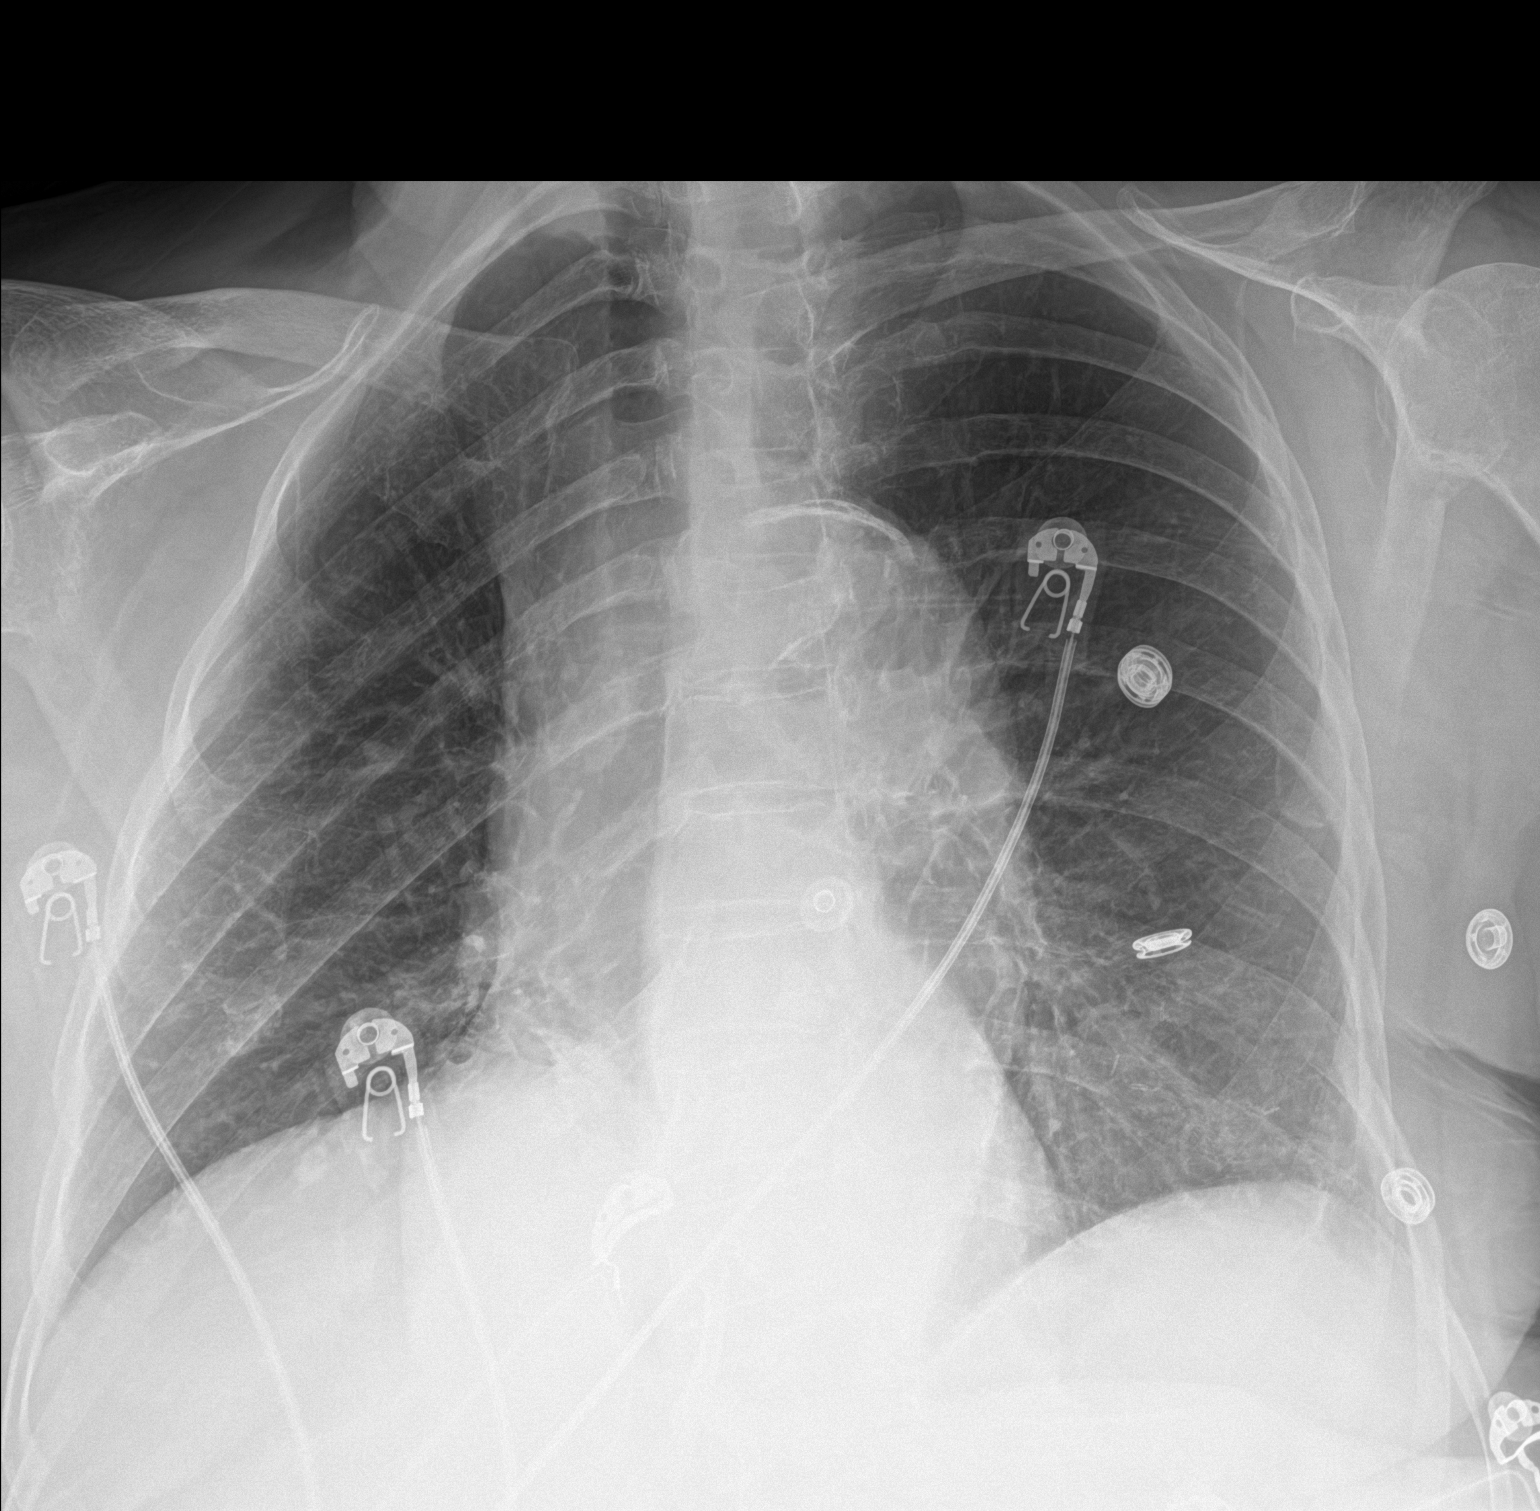

[1 of 1 positions shown; findings below may reference images not displayed]

FINDINGS: The patient is rotated to the right. The heart size and mediastinal
contours are within normal limits. Vascular calcifications are seen
in the aortic arch. both lungs are clear. The visualized skeletal
structures are unremarkable.
IMPRESSION: No active disease.

Aortic Atherosclerosis (3ZGYX-LBZ.Z).

## 2023-02-09 IMAGING — CT CT HEAD W/O CM
4 series · 17 of 47 positions shown, 19 images · non-contrast
Comparison: CT head dated 11/26/2019.

CLINICAL DATA: Altered mental status.



[Series 2: head wo · axial · 0.42mm/px · z∈[+1336,+1446]mm · 7 of 30 slices shown, 9 images]
[im 4/30  brain]
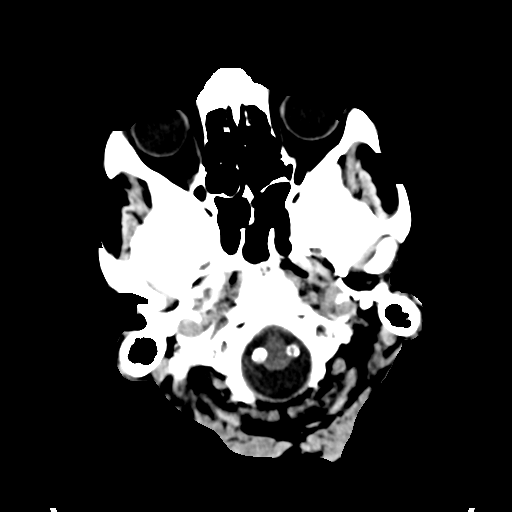
[im 4/30  bone]
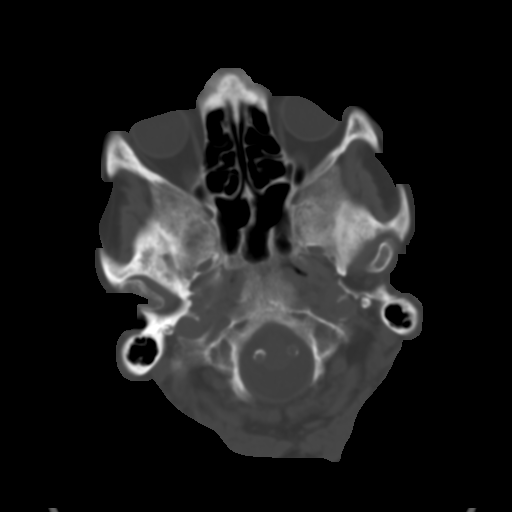
[im 8/30  brain]
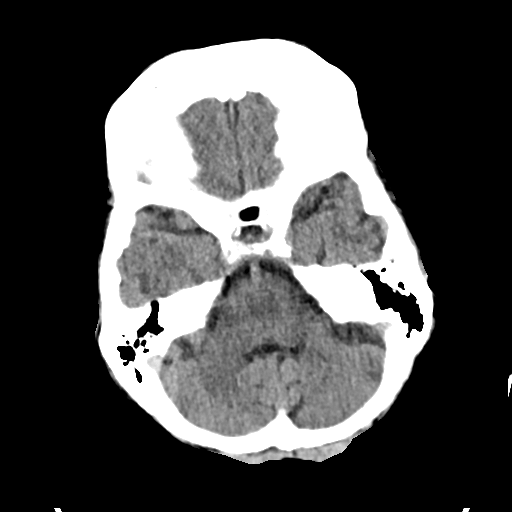
[im 11/30  brain]
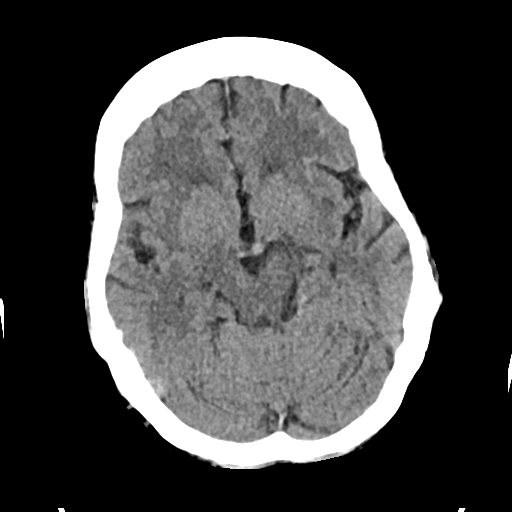
[im 15/30  brain]
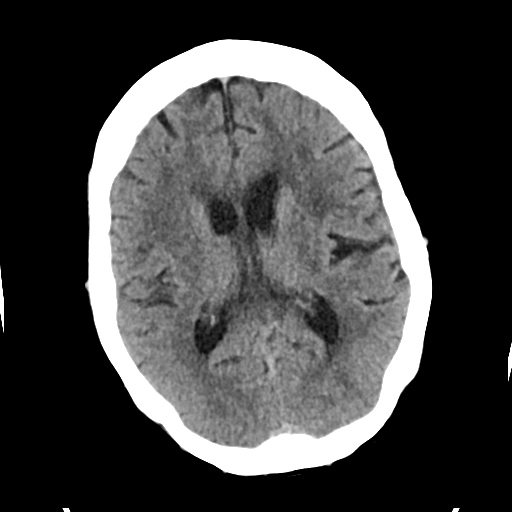
[im 19/30  brain]
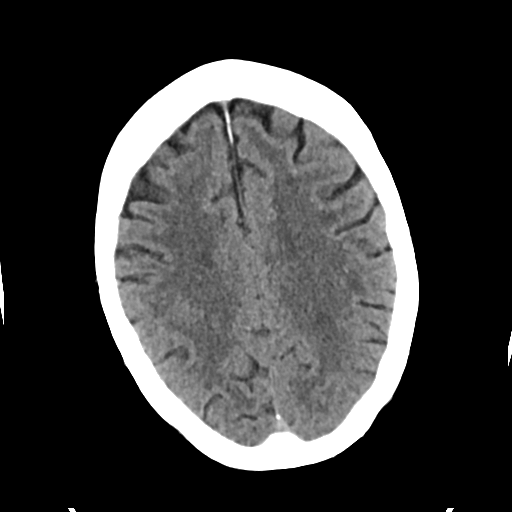
[im 19/30  bone]
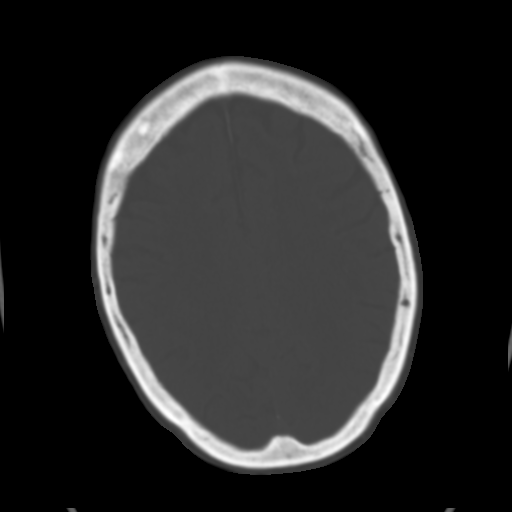
[im 22/30  brain]
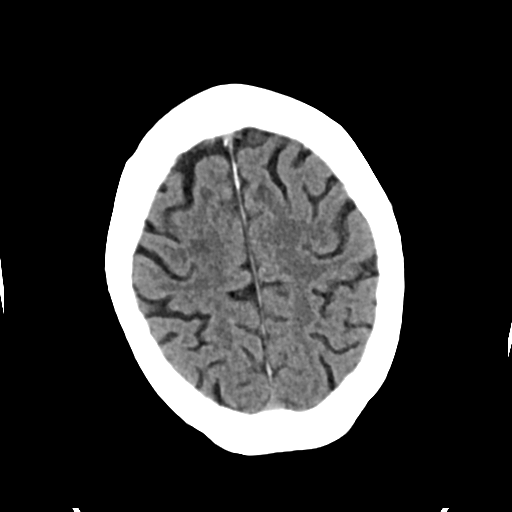
[im 26/30  brain]
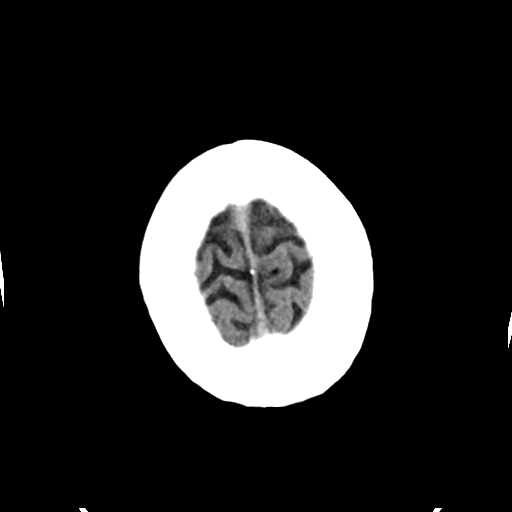

[Series 3: head bone · axial · 0.42mm/px · z∈[+1335,+1387]mm · 4 of 75 slices shown]
[im 8/75  bone]
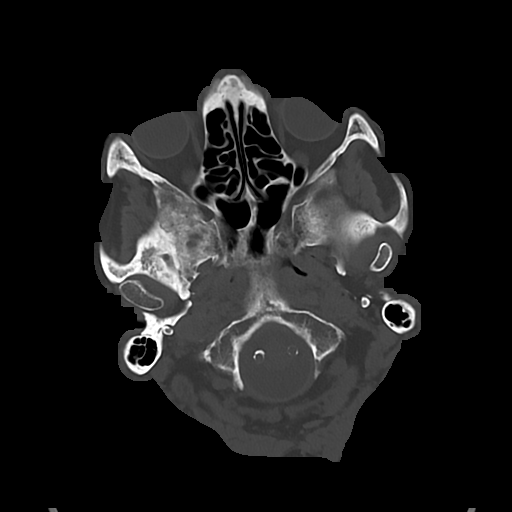
[im 15/75  bone]
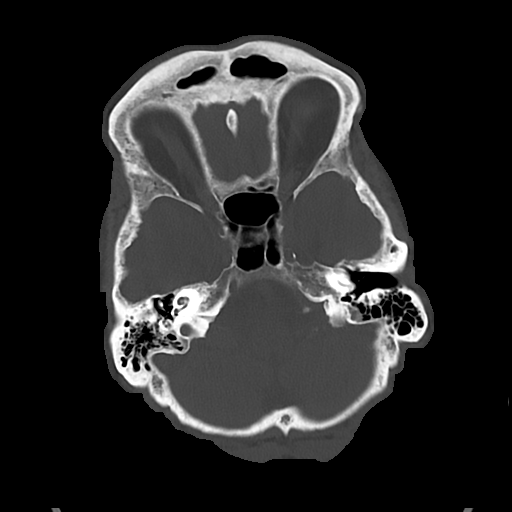
[im 23/75  bone]
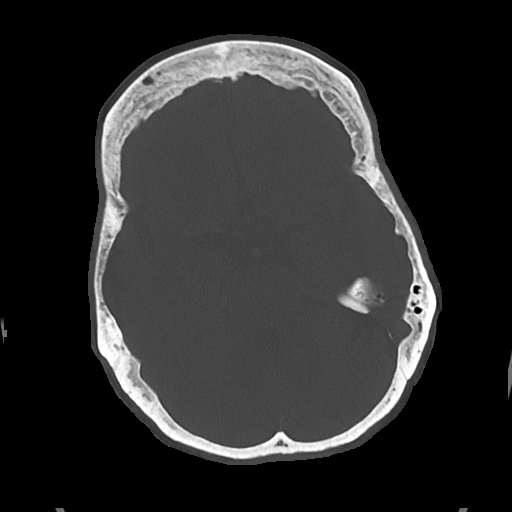
[im 34/75  bone]
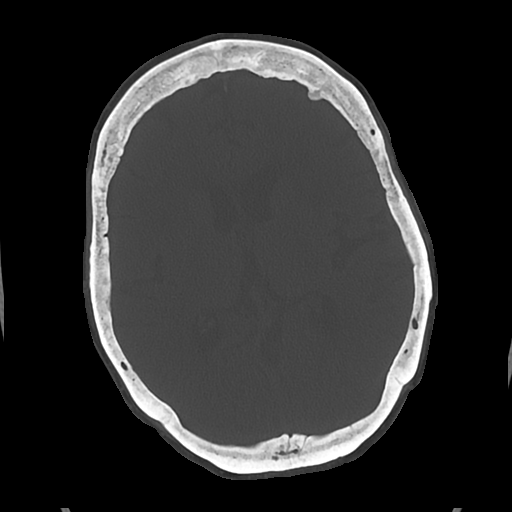

[Series 4: cor soft · coronal · 0.31mm/px · 3 of 66 slices shown]
[im 22/66  brain]
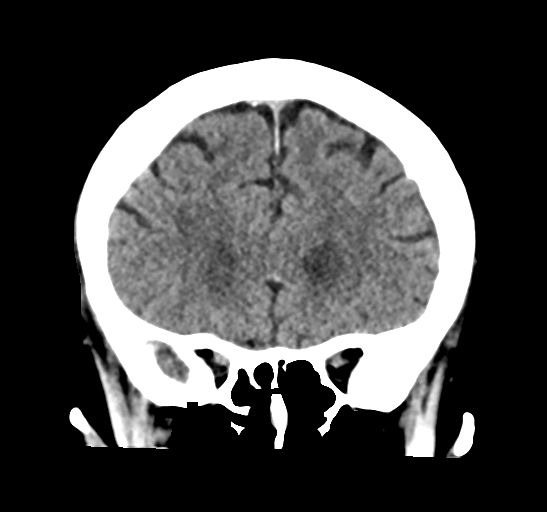
[im 29/66  brain]
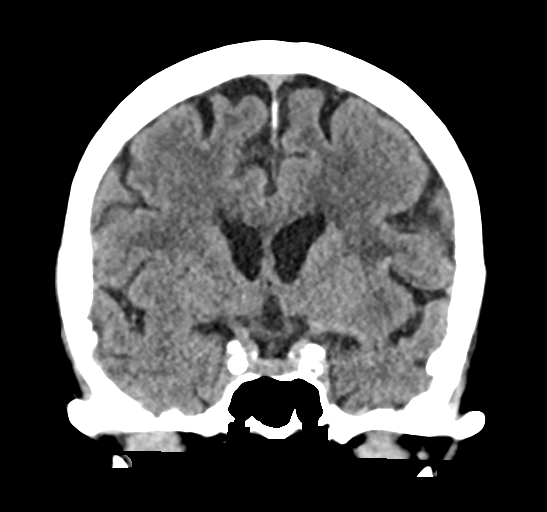
[im 37/66  brain]
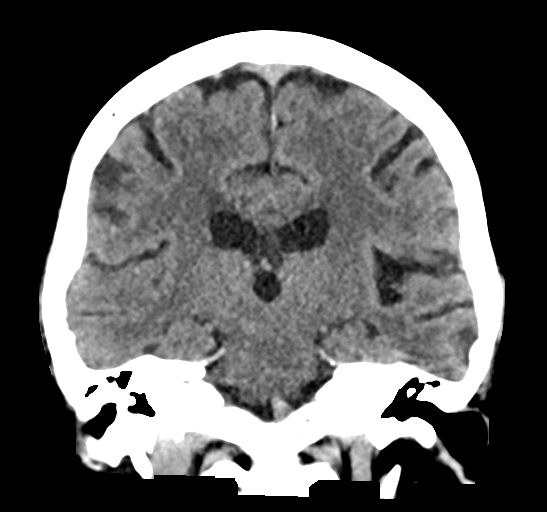

[Series 5: sag soft · sagittal · 0.31mm/px · 3 of 58 slices shown]
[im 20/58  brain]
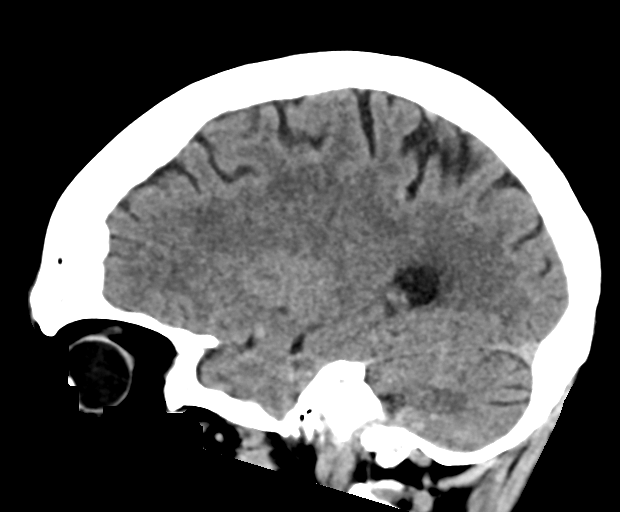
[im 29/58  brain]
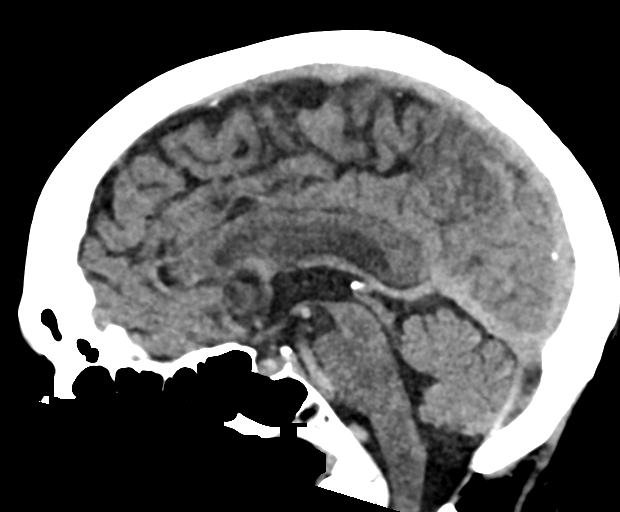
[im 39/58  brain]
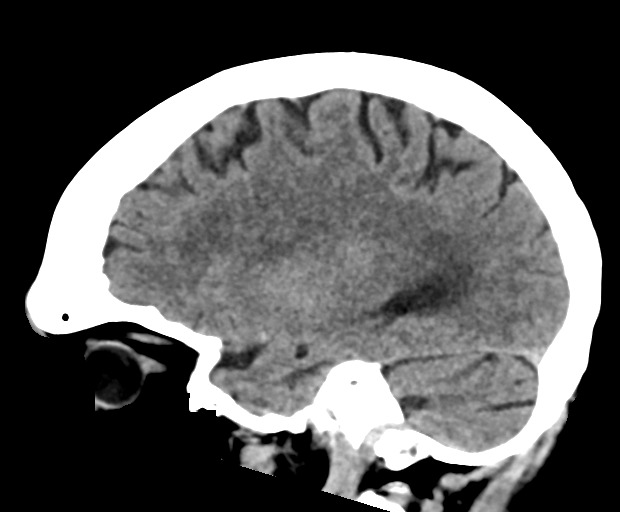

[17 of 47 positions shown; findings below may reference images not displayed]

FINDINGS: Brain: No evidence of acute infarction, hemorrhage, hydrocephalus,
extra-axial collection or mass lesion/mass effect. Periventricular
white matter hypoattenuation likely represents chronic small vessel
ischemic disease.

Vascular: There are vascular calcifications in the carotid siphons.

Skull: Normal. Negative for fracture or focal lesion.

Sinuses/Orbits: No acute finding.

Other: None.
IMPRESSION: No acute intracranial process.

## 2023-02-11 IMAGING — CT CT HEAD W/O CM
4 series · 17 of 47 positions shown, 19 images · non-contrast
Comparison: 08/16/2021

CLINICAL DATA: Altered mental status, migraine



[Series 3: head wo · axial · 0.41mm/px · z∈[-137,-12]mm · 7 of 35 slices shown, 9 images]
[im 5/35  brain]
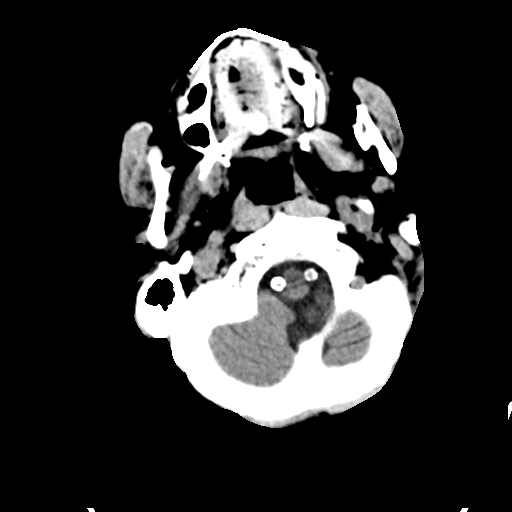
[im 5/35  bone]
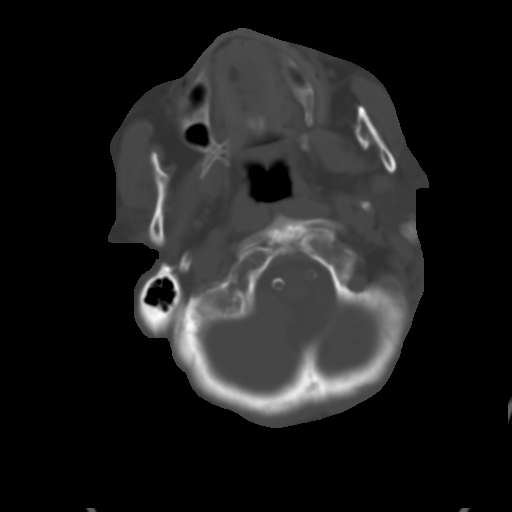
[im 9/35  brain]
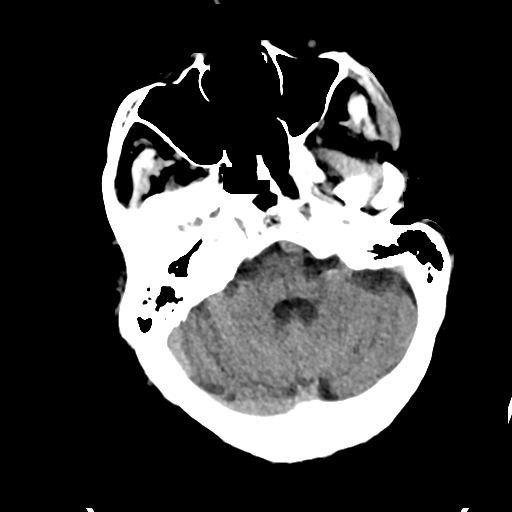
[im 13/35  brain]
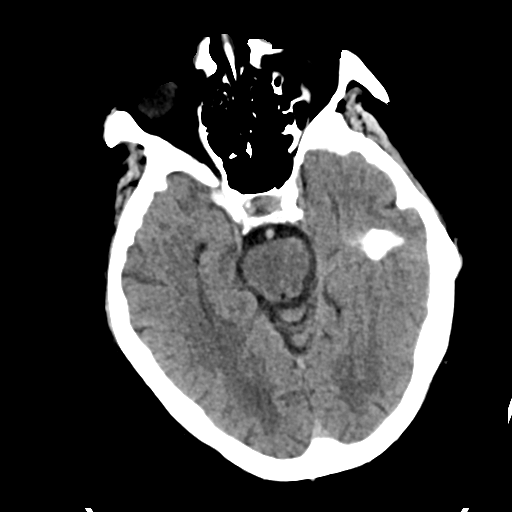
[im 18/35  brain]
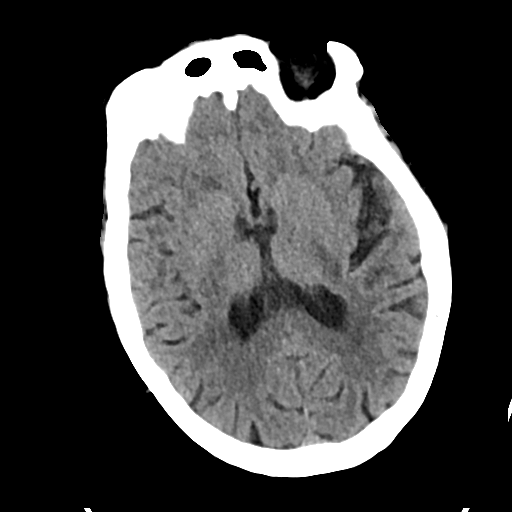
[im 22/35  brain]
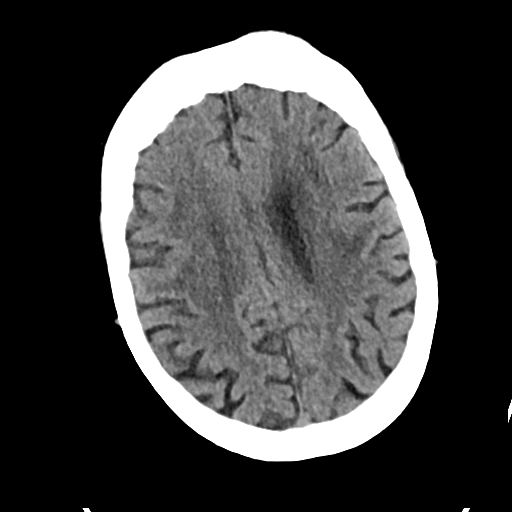
[im 22/35  bone]
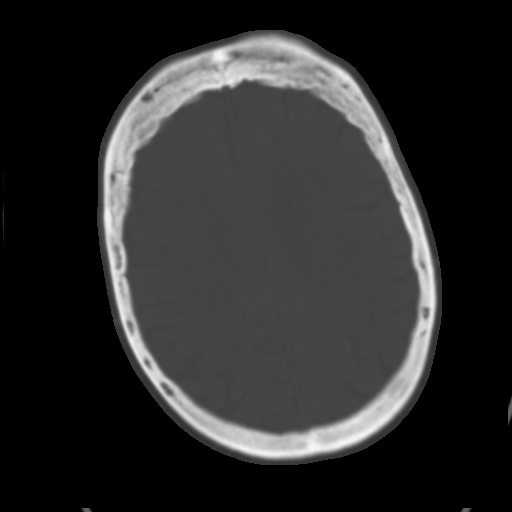
[im 26/35  brain]
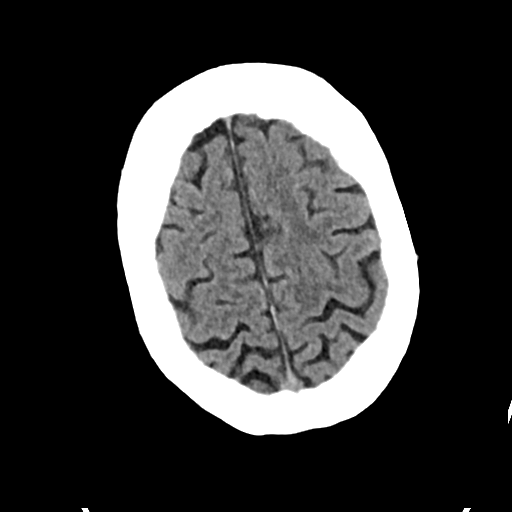
[im 30/35  brain]
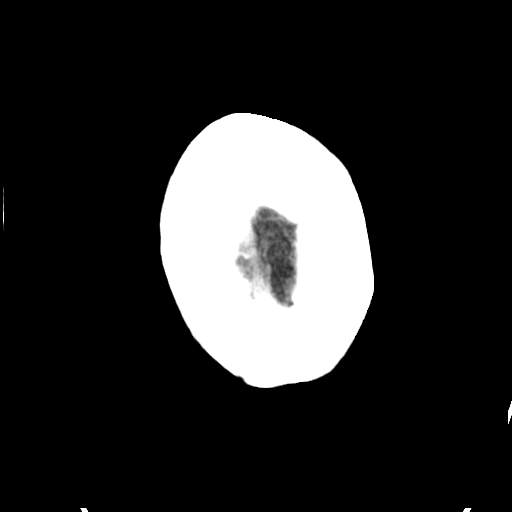

[Series 4: head bone · axial · 0.41mm/px · z∈[-141,-81]mm · 4 of 87 slices shown]
[im 9/87  bone]
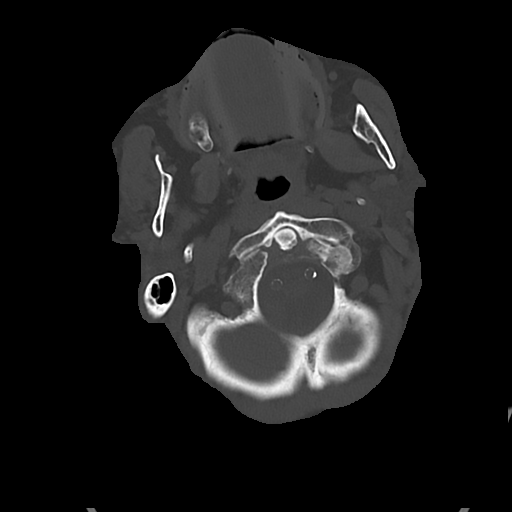
[im 18/87  bone]
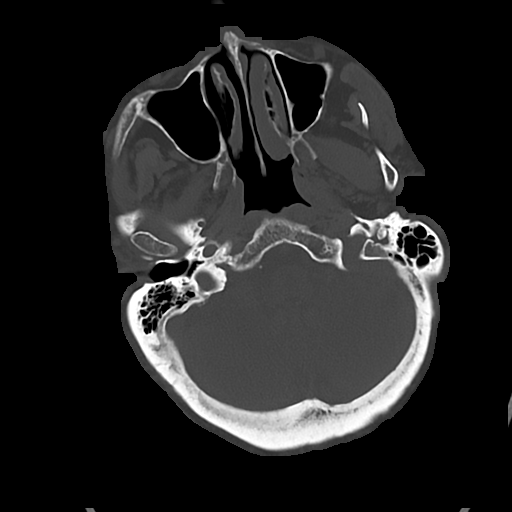
[im 26/87  bone]
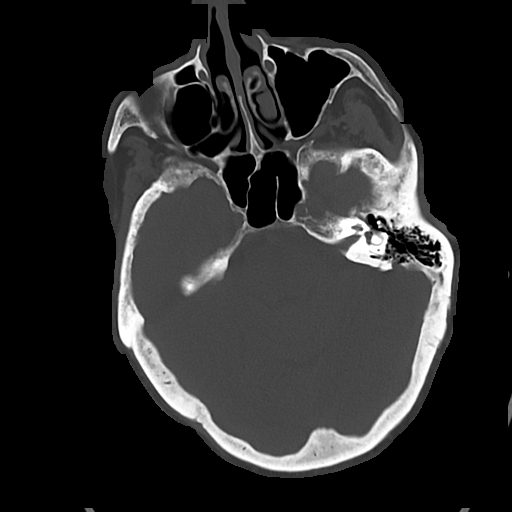
[im 39/87  bone]
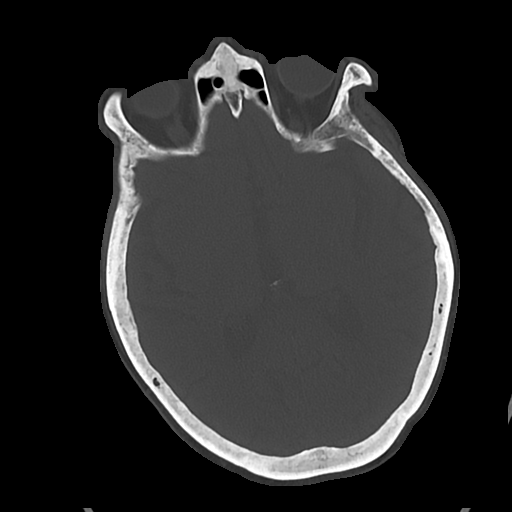

[Series 5: cor soft · coronal · 0.35mm/px · 3 of 68 slices shown]
[im 23/68  brain]
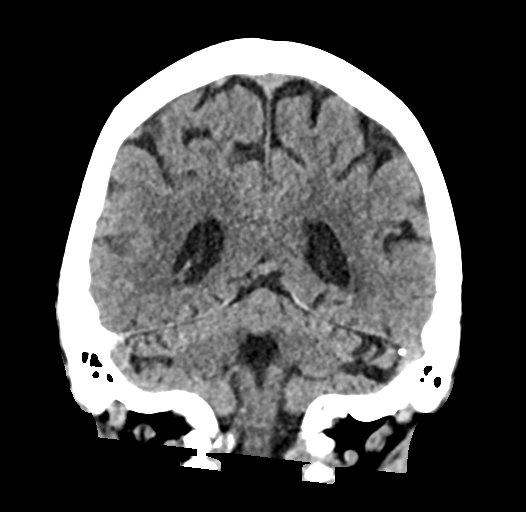
[im 30/68  brain]
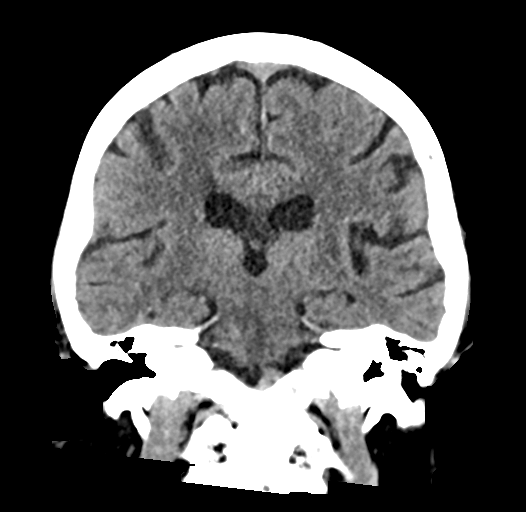
[im 38/68  brain]
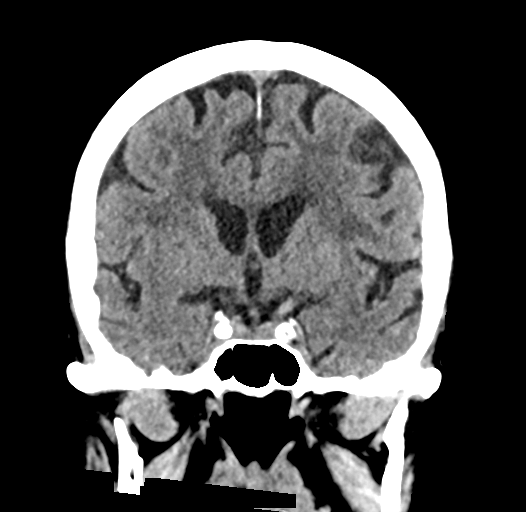

[Series 6: sag soft · sagittal · 0.35mm/px · 3 of 62 slices shown]
[im 22/62  brain]
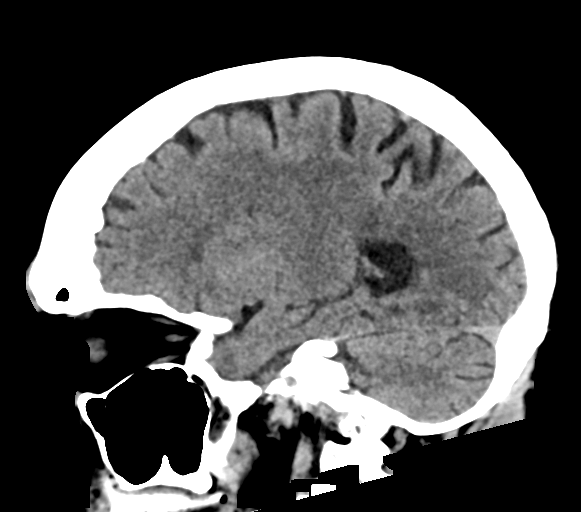
[im 31/62  brain]
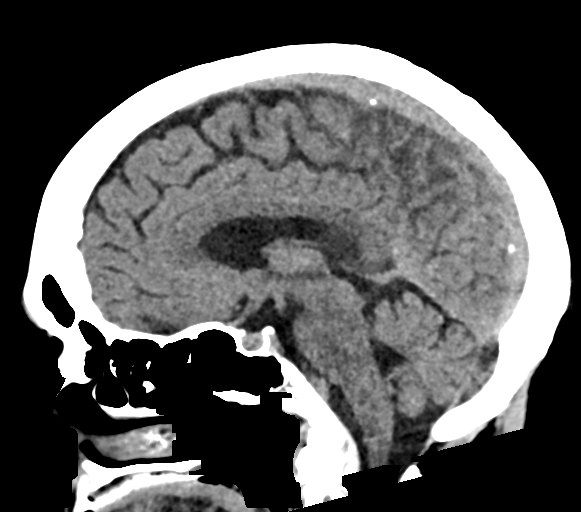
[im 41/62  brain]
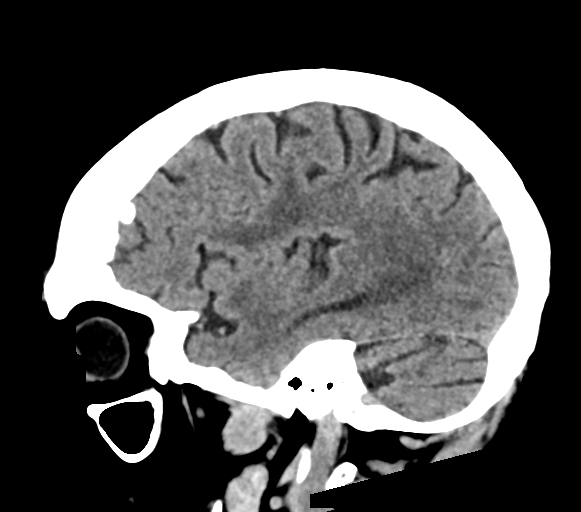

[17 of 47 positions shown; findings below may reference images not displayed]

FINDINGS: Brain: No evidence of acute infarction, hemorrhage, hydrocephalus,
extra-axial collection or mass lesion/mass effect.

Subcortical white matter and periventricular small vessel ischemic
changes.

Vascular: Intracranial atherosclerosis.

Skull: Normal. Negative for fracture or focal lesion.

Sinuses/Orbits: The visualized paranasal sinuses are essentially
clear. The mastoid air cells are unopacified.

Other: None.
IMPRESSION: No acute intracranial abnormality. Small vessel ischemic changes.

No interval change from recent CT.

## 2023-02-11 IMAGING — MR MR HEAD W/O CM
6 of 10 series · 27 of 48 positions shown · non-contrast
Comparison: 02/08/2020 MRI brain

CLINICAL DATA: Altered mental status, mild cognitive impairment,
weakness

EXAM:
MRI HEAD WITHOUT CONTRAST
TECHNIQUE: Multiplanar, multiecho pulse sequences of the brain and surrounding
structures were obtained without intravenous contrast.

[Series 2: DWI · axial · 3.0mm · 0.94mm/px · z∈[-67,+76]mm · 9 of 100 slices shown (1 of 2)]
[im 1/100]
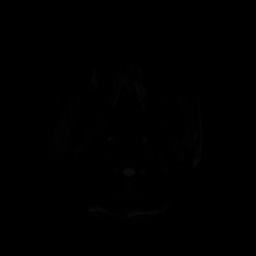
[im 13/100]
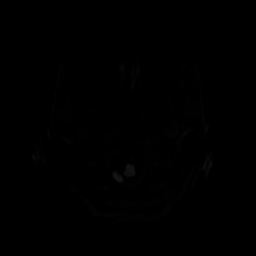
[im 25/100]
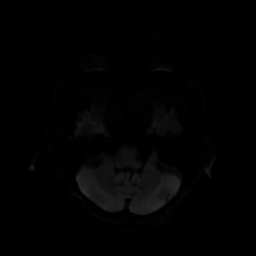
[im 38/100]
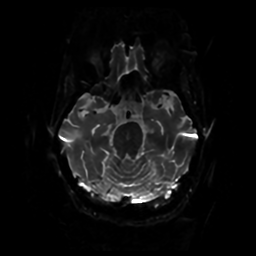
[im 50/100]
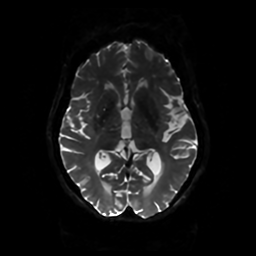
[im 62/100]
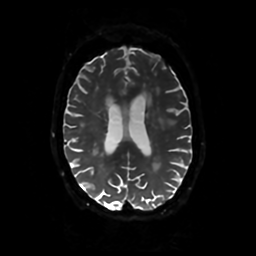
[im 75/100]
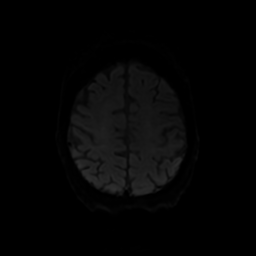
[im 87/100]
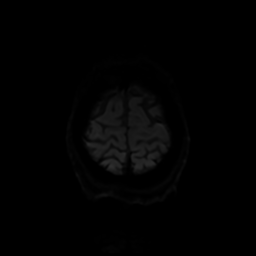
[im 100/100]
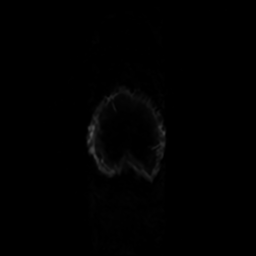

[Series 3: DWI · coronal · 4.0mm · 0.94mm/px · 6 of 74 slices shown (2 of 2)]
[im 1/74]
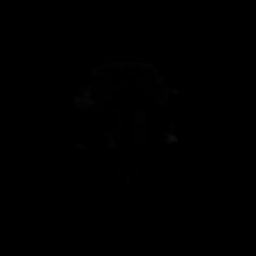
[im 15/74]
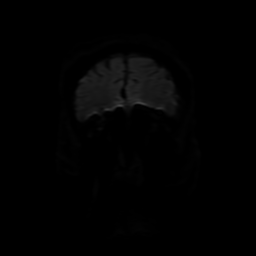
[im 30/74]
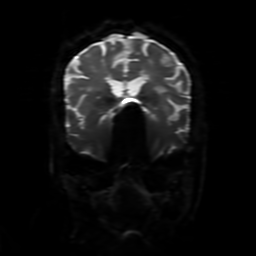
[im 44/74]
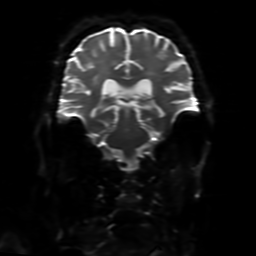
[im 59/74]
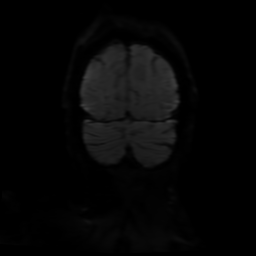
[im 74/74]
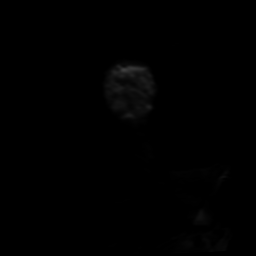

[Series 4: FLAIR · sagittal · 5.0mm · 0.23mm/px · 2 of 24 slices shown (1 of 2)]
[im 1/24]
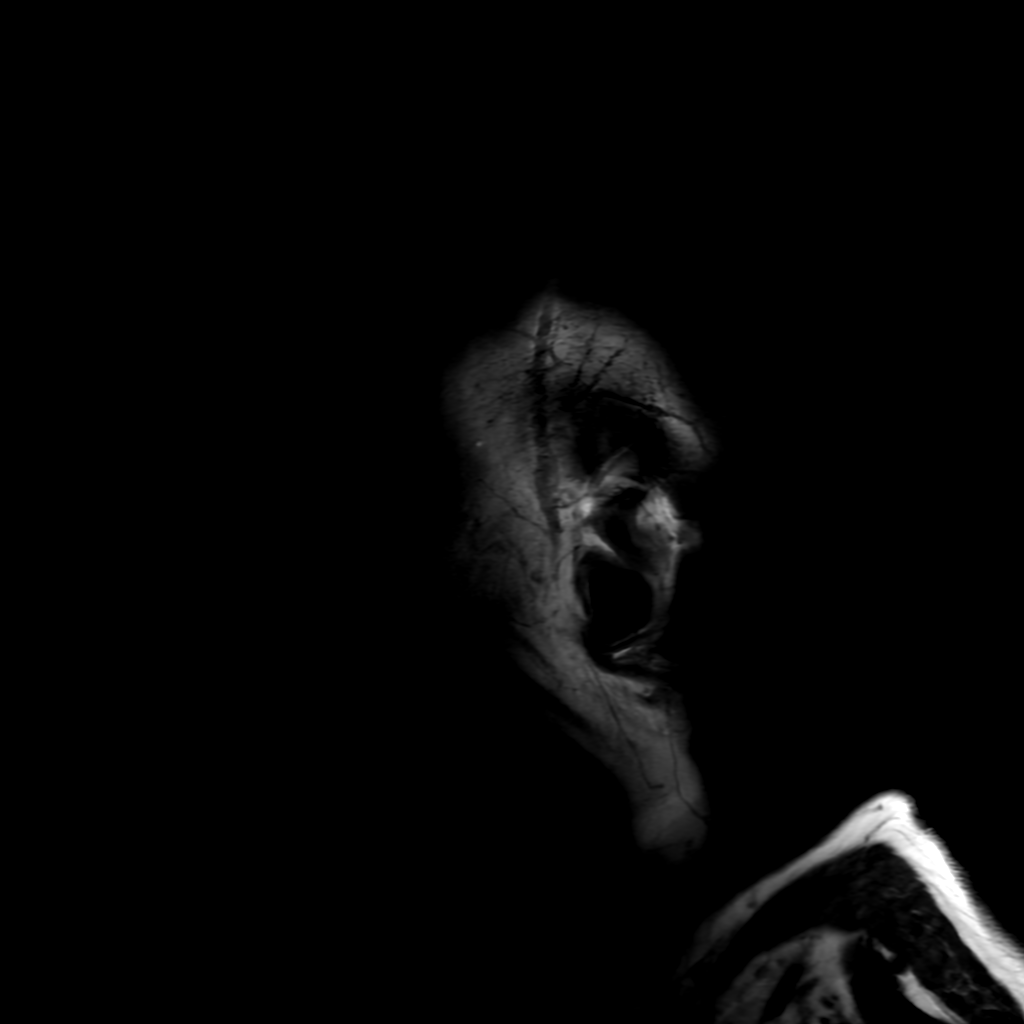
[im 24/24]
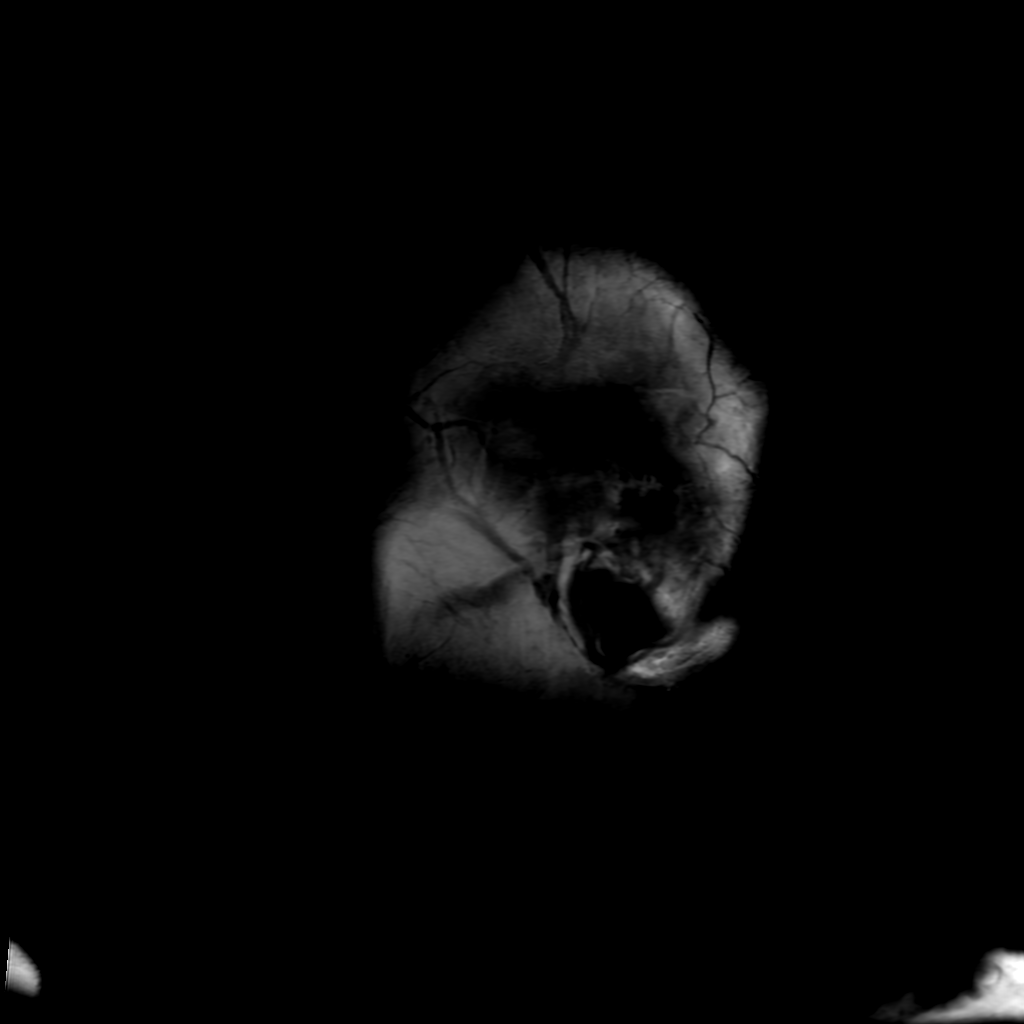

[Series 6: FLAIR · axial · 4.0mm · 0.45mm/px · z∈[-67,+75]mm · 3 of 34 slices shown (2 of 2)]
[im 1/34]
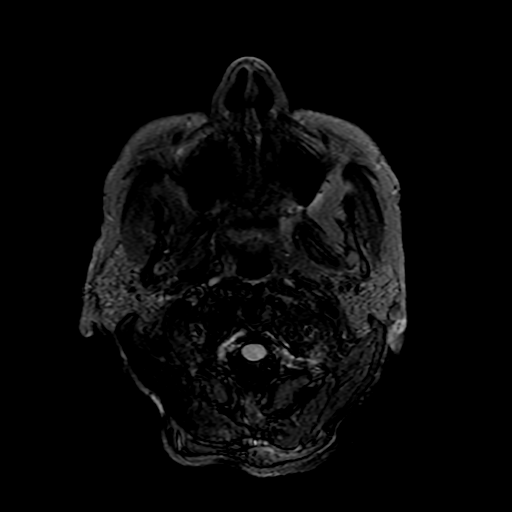
[im 17/34]
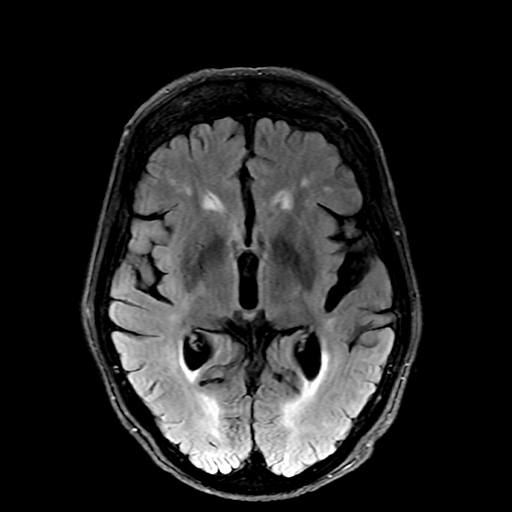
[im 34/34]
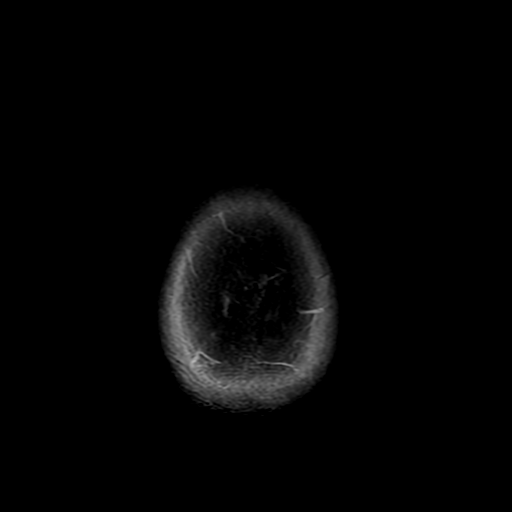

[Series 250: ADC · axial · 3.0mm · 0.94mm/px · z∈[-67,+76]mm · 4 of 50 slices shown (1 of 2)]
[im 1/50]
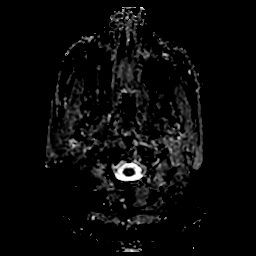
[im 17/50]
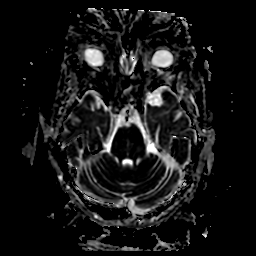
[im 33/50]
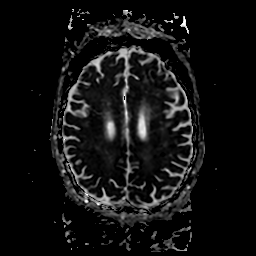
[im 50/50]
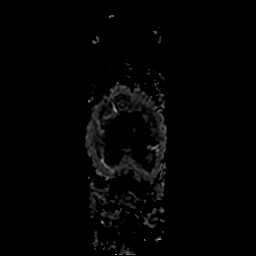

[Series 350: ADC · coronal · 4.0mm · 0.94mm/px · 3 of 37 slices shown (2 of 2)]
[im 1/37]
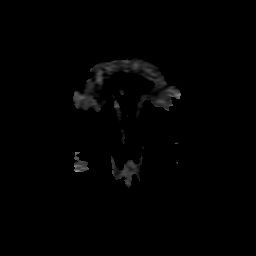
[im 19/37]
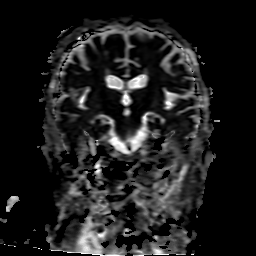
[im 37/37]
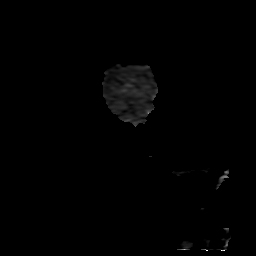

[27 of 48 positions shown; findings below may reference images not displayed]

FINDINGS: Brain: No restricted diffusion to suggest acute or subacute infarct.
No acute hemorrhage, mass, mass effect, or midline shift. No
hydrocephalus or extra-axial collection. No hemosiderin deposition
to suggest remote hemorrhage. Confluent and scattered T2
hyperintense signal in the periventricular white matter and pons,
likely the sequela of moderate chronic small vessel ischemic
disease, which have increased from the prior exam. Degree of global
cerebral volume loss is within normal limits for age and is without
lobar predominance.

Vascular: Normal arterial flow voids.

Skull and upper cervical spine: Normal marrow signal. Degenerative
changes in the cervical spine.

Sinuses/Orbits: No acute finding. Status post bilateral lens
replacements.

Other: The mastoids are well aerated.
IMPRESSION: No acute intracranial process. No evidence of acute or subacute
infarct.

## 2023-12-22 ENCOUNTER — Telehealth: Payer: Self-pay | Admitting: Neurology

## 2023-12-22 NOTE — Telephone Encounter (Signed)
 Cld Pt daughter, Jon ([place]). No answer, LVM for call back.

## 2023-12-22 NOTE — Telephone Encounter (Signed)
 Pt's daughter(not on DPR) is asking for pt to be able to see Dr Rosemarie.  Phone rep reached out to RN for POD 3 and explained request for appointment(pt worsening) .  RN said she will review and see if it is possible for pt to see NP.  RN says she will call back, since the daughter that called is not on [place] she is asking that her sister(Angela Sharron which is [place]) be called at [phone])

## 2023-12-23 ENCOUNTER — Emergency Department (HOSPITAL_COMMUNITY)

## 2023-12-23 ENCOUNTER — Emergency Department (HOSPITAL_COMMUNITY): Admission: EM | Admit: 2023-12-23 | Discharge: 2023-12-23 | Disposition: A | Discharge status: Home / Self Care

## 2023-12-23 ENCOUNTER — Encounter (HOSPITAL_COMMUNITY): Payer: Self-pay

## 2023-12-23 ENCOUNTER — Other Ambulatory Visit: Payer: Self-pay

## 2023-12-23 DIAGNOSIS — Z7901 Long term (current) use of anticoagulants: Secondary | ICD-10-CM | POA: Insufficient documentation

## 2023-12-23 DIAGNOSIS — I5030 Unspecified diastolic (congestive) heart failure: Secondary | ICD-10-CM | POA: Diagnosis not present

## 2023-12-23 DIAGNOSIS — F0390 Unspecified dementia without behavioral disturbance: Secondary | ICD-10-CM | POA: Diagnosis not present

## 2023-12-23 DIAGNOSIS — I2510 Atherosclerotic heart disease of native coronary artery without angina pectoris: Secondary | ICD-10-CM | POA: Diagnosis not present

## 2023-12-23 DIAGNOSIS — Z794 Long term (current) use of insulin: Secondary | ICD-10-CM | POA: Insufficient documentation

## 2023-12-23 DIAGNOSIS — Z7984 Long term (current) use of oral hypoglycemic drugs: Secondary | ICD-10-CM | POA: Insufficient documentation

## 2023-12-23 DIAGNOSIS — W19XXXA Unspecified fall, initial encounter: Secondary | ICD-10-CM | POA: Diagnosis not present

## 2023-12-23 DIAGNOSIS — E1165 Type 2 diabetes mellitus with hyperglycemia: Secondary | ICD-10-CM | POA: Diagnosis not present

## 2023-12-23 DIAGNOSIS — I110 Hypertensive heart disease with heart failure: Secondary | ICD-10-CM | POA: Diagnosis not present

## 2023-12-23 DIAGNOSIS — Z79899 Other long term (current) drug therapy: Secondary | ICD-10-CM | POA: Diagnosis not present

## 2023-12-23 DIAGNOSIS — Z8673 Personal history of transient ischemic attack (TIA), and cerebral infarction without residual deficits: Secondary | ICD-10-CM | POA: Diagnosis not present

## 2023-12-23 DIAGNOSIS — R519 Headache, unspecified: Secondary | ICD-10-CM | POA: Diagnosis present

## 2023-12-23 LAB — URINALYSIS, ROUTINE W REFLEX MICROSCOPIC
Bacteria, UA: NONE SEEN
Bilirubin Urine: NEGATIVE
Glucose, UA: 50 mg/dL — AB
Hgb urine dipstick: NEGATIVE
Ketones, ur: NEGATIVE mg/dL
Leukocytes,Ua: NEGATIVE
Nitrite: NEGATIVE
Protein, ur: 30 mg/dL — AB
Specific Gravity, Urine: 1.008 (ref 1.005–1.030)
pH: 7 (ref 5.0–8.0)

## 2023-12-23 LAB — CBC
HCT: 40 % (ref 36.0–46.0)
Hemoglobin: 12.9 g/dL (ref 12.0–15.0)
MCH: 30.2 pg (ref 26.0–34.0)
MCHC: 32.3 g/dL (ref 30.0–36.0)
MCV: 93.7 fL (ref 80.0–100.0)
Platelets: 170 K/uL (ref 150–400)
RBC: 4.27 MIL/uL (ref 3.87–5.11)
RDW: 13.5 % (ref 11.5–15.5)
WBC: 7.2 K/uL (ref 4.0–10.5)
nRBC: 0 % (ref 0.0–0.2)

## 2023-12-23 LAB — COMPREHENSIVE METABOLIC PANEL WITH GFR
ALT: 16 U/L (ref 0–44)
AST: 24 U/L (ref 15–41)
Albumin: 3.4 g/dL — ABNORMAL LOW (ref 3.5–5.0)
Alkaline Phosphatase: 85 U/L (ref 38–126)
Anion gap: 12 (ref 5–15)
BUN: 16 mg/dL (ref 8–23)
CO2: 27 mmol/L (ref 22–32)
Calcium: 9.4 mg/dL (ref 8.9–10.3)
Chloride: 100 mmol/L (ref 98–111)
Creatinine, Ser: 1.28 mg/dL — ABNORMAL HIGH (ref 0.44–1.00)
GFR, Estimated: 41 mL/min — ABNORMAL LOW (ref >60)
Glucose, Bld: 189 mg/dL — ABNORMAL HIGH (ref 70–99)
Potassium: 4 mmol/L (ref 3.5–5.1)
Sodium: 139 mmol/L (ref 135–145)
Total Bilirubin: 0.8 mg/dL (ref 0.0–1.2)
Total Protein: 7.2 g/dL (ref 6.5–8.1)

## 2023-12-23 LAB — CK: Total CK: 63 U/L (ref 38–234)

## 2023-12-23 MED ORDER — ACETAMINOPHEN 325 MG PO TABS
650.0000 mg | ORAL_TABLET | Freq: Once | ORAL | Status: AC
  Administered 2023-12-23: 650 mg via ORAL
  Filled 2023-12-23: qty 2

## 2023-12-23 NOTE — ED Notes (Signed)
 Patient placed on bedpan.

## 2023-12-23 NOTE — Progress Notes (Signed)
 Orthopedic Tech Progress Note Patient Details:  [name] 1940-11-05 [id number]  Patient ID: [name], female   DOB: 1940-04-23, 83 y.o.   MRN: [id number] Level 2 trauma. Not needed at this time. [name] 12/23/2023, 11:21 AM

## 2023-12-23 NOTE — ED Notes (Signed)
 Pt's bedding changed d/t being soiled from bedpan.

## 2023-12-23 NOTE — ED Triage Notes (Signed)
 Pt bib EMS from home. Pt went to sleep in her recliner last night. Family found her on the floor this morning. No injuries noted. Pt c/o headache. Pt on eliquis . Family reported she is at baseline with dementia.

## 2023-12-23 NOTE — Discharge Instructions (Signed)
 Please return if you develop severe headache, facial droop, lightheadedness, passout, chest pain, shortness of breath, uncontrolled nausea vomiting, stop having bowel movements or any new or worsening symptoms that are concerning to you.

## 2023-12-23 NOTE — ED Provider Notes (Signed)
 [place] EMERGENCY DEPARTMENT AT [facility] Provider Note   CSN: [id number] Arrival date & time: 12/23/23  1107     Patient presents with: [name] is a 83 y.o. female.   This is an 83 year old female presenting emergency department as a trauma activation, fall on Eliquis .  She has a history of dementia and is at her baseline mentation per EMS's report.  Reportedly was asleep in recliner last night and was found on the floor this morning, but does not recall specifically what happened.  She is complaining of a left-sided headache, but no other complaints   Fall       Prior to Admission medications   Medication Sig Start Date End Date Taking? Authorizing Provider  ALPRAZolam  (XANAX ) 0.25 MG tablet Take 1-2 tablets (0.25-0.5 mg total) by mouth 2 (two) times daily as needed for anxiety. 04/17/21   Lanny Callander, MD  amitriptyline  (ELAVIL ) 25 MG tablet Take 1 tablet (25 mg total) by mouth at bedtime. 08/20/21   Ghimire, Donalda HERO, MD  Biotin 89999 MCG TABS Take 1 tablet by mouth every evening.    [provider]  Blood Glucose Monitoring Suppl (FREESTYLE LITE) w/Device KIT use as directed 07/21/20   Shona Terry SAILOR, DO  bumetanide  (BUMEX ) 2 MG tablet Take 1 tablet (2 mg total) by mouth daily. 02/25/22   Ghimire, Kuber, MD  Cholecalciferol  (VITAMIN D3) 50 MCG (2000 UT) capsule Take 2,000 Units by mouth every evening.    [provider]  Coenzyme Q10 (COQ10 PO) Take 1 capsule by mouth every evening.    [provider]  COLCRYS  0.6 MG tablet TAKE 1 TABLET DAILY AS NEEDED FOR GOUT FLARE. Patient taking differently: Take 0.6 mg by mouth daily as needed (gout flare up). 12/27/18   Harden Jerona GAILS, MD  CRANBERRY PO Take 4,200 mg by mouth every evening.    [provider]  ELIQUIS  5 MG TABS tablet Take 5 mg by mouth 2 (two) times daily. 07/13/21   [provider]  febuxostat  (ULORIC ) 40 MG tablet Take 40 mg by mouth daily. 12/31/19    [provider]  fluticasone  (FLONASE ) 50 MCG/ACT nasal spray Place 1 spray into both nostrils daily. 02/26/22   Raenelle Coria, MD  gabapentin  (NEURONTIN ) 300 MG capsule Take 300 mg by mouth at bedtime. 12/18/19   [provider]  glipiZIDE (GLUCOTROL XL) 2.5 MG 24 hr tablet Take 2.5 mg by mouth daily with breakfast.    [provider]  glucose blood (FREESTYLE LITE) test strip Test blood sugars up to 4 times daily 07/21/20   Shona Terry SAILOR, DO  insulin  glargine (LANTUS ) 100 UNIT/ML Solostar Pen Inject 5 Units into the skin daily. Patient taking differently: Inject 25 Units into the skin daily. 07/21/20   Shona Terry SAILOR, DO  Insulin  Pen Needle (PENTIPS) 32G X 4 MM MISC Use as directed 07/21/20   Shona Terry SAILOR, DO  Insulin  Pen Needle 32G X 4 MM MISC 5 Units by Does not apply route in the morning and at bedtime. 07/21/20   Shona Terry SAILOR, DO  insulin  regular (NOVOLIN R) 100 units/mL injection Inject into the skin 3 (three) times daily before meals. Started last week 4/28: Sliding scale before meals: <150 none,  151-199 - 2 units                                                                            200-249 - 4 units                                                                            250-299 - 6 units                                                                            300-349 - 8 units                                                                            350> 10 units    [provider]  JANUVIA 25 MG tablet Take 25 mg by mouth daily. 12/26/19   [provider]  Anselm Oil 500 MG CAPS Take 500 mg by mouth daily.    [provider]  Lancets (FREESTYLE) lancets Test blood sugars up to 4 times daily 07/21/20   Shona Terry SAILOR, DO  memantine  (NAMENDA ) 10 MG tablet Take 1 tablet (10 mg total) by mouth 2 (two) times daily. 10/06/21   Sethi, Pramod S, MD   Methylfol-Algae-B12-Acetylcyst (CEREFOLIN NAC PO) Take 1 tablet by mouth in the morning.    [provider]  metolazone  (ZAROXOLYN ) 2.5 MG tablet Take 2.5 mg by mouth See admin instructions. One tablet by mouth Monday and Thursday 30 min before Bumex  09/21/21   [provider]  metoprolol  succinate (TOPROL -XL) 25 MG 24 hr tablet Take 25 mg by mouth at bedtime.  12/27/19   [provider]  Multiple Vitamin (MULTIVITAMIN WITH MINERALS) TABS tablet Take 1 tablet by mouth daily. 03/03/20   Arlice Reichert, MD  nitroGLYCERIN  (NITROLINGUAL ) 0.4 MG/SPRAY spray Place 1 spray under the tongue every 5 (five) minutes x 3 doses as needed for chest pain.    [provider]  pantoprazole  (PROTONIX ) 40 MG tablet Take 1 tablet (40 mg total) by mouth 2 (two) times daily before a meal. 03/02/20   Dahal, Reichert, MD  potassium chloride  SA (KLOR-CON  M) 20 MEQ tablet Take 20 mEq by mouth daily.    [provider]  pramipexole  (MIRAPEX ) 0.25 MG tablet TAKE 1 TO 2 TABLETS AS INSTRUCTED, 1 TABLET AT 4:00 P.M. AND 2 TABLETS AT BEDTIME Patient  taking differently: See admin instructions. Take one tablet by mouth at 1 4 pm and 2 tablets at bedtime. 09/27/19   Whitfield Raisin, NP  rosuvastatin  (CRESTOR ) 20 MG tablet Take 20 mg by mouth daily.    [provider]    Allergies: Nsaids, Reglan  [metoclopramide ], Ultram [tramadol], Lipitor [atorvastatin], Lyrica [pregabalin], and Urecholine  [bethanechol ]    Review of Systems  Updated Vital Signs BP (!) 162/88 (BP Location: Right Arm)   Pulse 81   Temp (!) 97.3 F (36.3 C) (Oral)   Resp 18   Ht 5' 4 (1.626 m)   Wt 72.6 kg   SpO2 99%   BMI 27.46 kg/m   Physical Exam Vitals and nursing note reviewed.  Constitutional:      General: She is not in acute distress.    Appearance: She is not toxic-appearing.  HENT:     Head: Normocephalic.     Nose: Nose normal.     Mouth/Throat:     Mouth: Mucous membranes are moist.  Eyes:      Conjunctiva/sclera: Conjunctivae normal.     Pupils: Pupils are equal, round, and reactive to light.  Cardiovascular:     Rate and Rhythm: Normal rate and regular rhythm.     Pulses: Normal pulses.  Pulmonary:     Effort: Pulmonary effort is normal.     Breath sounds: Normal breath sounds.  Abdominal:     General: Abdomen is flat. There is no distension.     Palpations: Abdomen is soft.     Tenderness: There is no abdominal tenderness. There is no guarding or rebound.  Musculoskeletal:        General: No tenderness.  Skin:    General: Skin is warm and dry.     Capillary Refill: Capillary refill takes less than 2 seconds.  Neurological:     General: No focal deficit present.     Mental Status: She is alert. Mental status is at baseline.  Psychiatric:        Mood and Affect: Mood normal.        Behavior: Behavior normal.     (all labs ordered are listed, but only abnormal results are displayed) Labs Reviewed  COMPREHENSIVE METABOLIC PANEL WITH GFR - Abnormal; Notable for the following components:      Result Value   Glucose, Bld 189 (*)    Creatinine, Ser 1.28 (*)    Albumin  3.4 (*)    GFR, Estimated 41 (*)    All other components within normal limits  URINALYSIS, ROUTINE W REFLEX MICROSCOPIC - Abnormal; Notable for the following components:   Color, Urine STRAW (*)    Glucose, UA 50 (*)    Protein, ur 30 (*)    All other components within normal limits  CBC  CK    EKG: None  Radiology: CT HEAD WO CONTRAST Result Date: 12/23/2023 CLINICAL DATA:  Provided history: Head trauma, moderate/severe. Polytrauma, blunt. Fall. EXAM: CT HEAD WITHOUT CONTRAST CT CERVICAL SPINE WITHOUT CONTRAST TECHNIQUE: Multidetector CT imaging of the head and cervical spine was performed following the standard protocol without intravenous contrast. Multiplanar CT image reconstructions of the cervical spine were also generated. RADIATION DOSE REDUCTION: This exam was performed according to  the departmental dose-optimization program which includes automated exposure control, adjustment of the mA and/or kV according to patient size and/or use of iterative reconstruction technique. COMPARISON:  Head CT 02/24/2022.  Cervical spine CT 02/24/2022. FINDINGS: CT HEAD FINDINGS Brain: Mild generalized cerebral atrophy. Patchy and  ill-defined hypoattenuation within the cerebral white matter, nonspecific but compatible with moderate chronic small vessel ischemic disease. There is no acute intracranial hemorrhage. No demarcated cortical infarct. No extra-axial fluid collection. No evidence of an intracranial mass. No midline shift. Vascular: No hyperdense vessel.  Atherosclerotic calcifications. Skull: No calvarial fracture or aggressive osseous lesion. Sinuses/Orbits: No mass or acute finding within the imaged orbits. No significant paranasal sinus disease at the imaged levels. CT CERVICAL SPINE FINDINGS Alignment: Levocurvature of the cervical spine. Nonspecific reversal of the expected cervical lordosis. Grade 1 anterolisthesis at C2-C3, C3-C4, C4-C5, C5-C6 and C7-T1. Skull base and vertebrae: The basion-dental and atlanto-dental intervals are maintained.No evidence of acute fracture to the cervical spine. Soft tissues and spinal canal: No prevertebral soft tissue swelling or visible canal hematoma. Disc levels: Cervical spondylosis with multilevel disc space narrowing, disc bulges, uncovertebral hypertrophy and facet arthropathy. Disc space narrowing is greatest at C5-C6, C6-C7 and C7-T1 (advanced at these levels). No appreciable high-grade spinal canal stenosis. Multilevel bony neural foraminal narrowing. Degenerative changes also present at the C1-C2 articulation. Redemonstrated cystic changes within the dens. Upper chest: No consolidation within the imaged lung apices. No visible pneumothorax. Biapical pleuroparenchymal scarring. IMPRESSION: CT head: 1.  No evidence of an acute intracranial abnormality. 2.  Parenchymal atrophy and chronic small vessel ischemic disease. CT cervical spine: 1. No evidence of an acute cervical spine fracture. 2. Nonspecific reversal of the expected cervical lordosis. 3. Levocurvature of the cervical spine. 4. Grade 1 anterolisthesis at C2-C3, C3-C4, C4-C5, C5-C6 and C7-T1. 5. Cervical spondylosis as described. 6. 1.6 cm right thyroid  lobe nodule. Given the patient's age, a non-emergent thyroid  ultrasound may be obtained for further evaluation as clinically appropriate. Reference: J Am Coll Radiol. 2015 Feb;12(2): 143-50. Electronically Signed   By: Rockey Childs D.O.   On: 12/23/2023 12:22   CT CERVICAL SPINE WO CONTRAST Result Date: 12/23/2023 CLINICAL DATA:  Provided history: Head trauma, moderate/severe. Polytrauma, blunt. Fall. EXAM: CT HEAD WITHOUT CONTRAST CT CERVICAL SPINE WITHOUT CONTRAST TECHNIQUE: Multidetector CT imaging of the head and cervical spine was performed following the standard protocol without intravenous contrast. Multiplanar CT image reconstructions of the cervical spine were also generated. RADIATION DOSE REDUCTION: This exam was performed according to the departmental dose-optimization program which includes automated exposure control, adjustment of the mA and/or kV according to patient size and/or use of iterative reconstruction technique. COMPARISON:  Head CT 02/24/2022.  Cervical spine CT 02/24/2022. FINDINGS: CT HEAD FINDINGS Brain: Mild generalized cerebral atrophy. Patchy and ill-defined hypoattenuation within the cerebral white matter, nonspecific but compatible with moderate chronic small vessel ischemic disease. There is no acute intracranial hemorrhage. No demarcated cortical infarct. No extra-axial fluid collection. No evidence of an intracranial mass. No midline shift. Vascular: No hyperdense vessel.  Atherosclerotic calcifications. Skull: No calvarial fracture or aggressive osseous lesion. Sinuses/Orbits: No mass or acute finding within the imaged  orbits. No significant paranasal sinus disease at the imaged levels. CT CERVICAL SPINE FINDINGS Alignment: Levocurvature of the cervical spine. Nonspecific reversal of the expected cervical lordosis. Grade 1 anterolisthesis at C2-C3, C3-C4, C4-C5, C5-C6 and C7-T1. Skull base and vertebrae: The basion-dental and atlanto-dental intervals are maintained.No evidence of acute fracture to the cervical spine. Soft tissues and spinal canal: No prevertebral soft tissue swelling or visible canal hematoma. Disc levels: Cervical spondylosis with multilevel disc space narrowing, disc bulges, uncovertebral hypertrophy and facet arthropathy. Disc space narrowing is greatest at C5-C6, C6-C7 and C7-T1 (advanced at these levels). No appreciable high-grade spinal canal  stenosis. Multilevel bony neural foraminal narrowing. Degenerative changes also present at the C1-C2 articulation. Redemonstrated cystic changes within the dens. Upper chest: No consolidation within the imaged lung apices. No visible pneumothorax. Biapical pleuroparenchymal scarring. IMPRESSION: CT head: 1.  No evidence of an acute intracranial abnormality. 2. Parenchymal atrophy and chronic small vessel ischemic disease. CT cervical spine: 1. No evidence of an acute cervical spine fracture. 2. Nonspecific reversal of the expected cervical lordosis. 3. Levocurvature of the cervical spine. 4. Grade 1 anterolisthesis at C2-C3, C3-C4, C4-C5, C5-C6 and C7-T1. 5. Cervical spondylosis as described. 6. 1.6 cm right thyroid  lobe nodule. Given the patient's age, a non-emergent thyroid  ultrasound may be obtained for further evaluation as clinically appropriate. Reference: J Am Coll Radiol. 2015 Feb;12(2): 143-50. Electronically Signed   By: Rockey Childs D.O.   On: 12/23/2023 12:22   DG Pelvis Portable Result Date: 12/23/2023 EXAM: 1 or 2 VIEW(S) XRAY OF THE PELVIS 12/23/2023 11:21:39 AM COMPARISON: 02/16/20. CLINICAL HISTORY: Trauma. Per chart - Pt bib EMS from home. Pt went  to sleep in her recliner last night. Family found her on the floor this morning. No injuries noted. Pt c/o headache. Pt on eliquis . Family reported she is at baseline with dementia. 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 No acute fracture. No focal osseous lesion. No joint dislocation. SOFT TISSUES: The soft tissues are unremarkable. IMPRESSION: 1. No acute findings. 2. 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 Electronically signed by: Waddell Calk MD 12/23/2023 11:50 AM EDT RP Workstation: [id number]   DG Chest Port 1 View Result Date: 12/23/2023 EXAM: 1 VIEW(S) XRAY OF THE CHEST 12/23/2023 11:21:39 AM COMPARISON: 10/20/2023 CLINICAL HISTORY: Trauma. Per chart - Pt bib EMS from home. Pt went to sleep in her recliner last night. Family found her on the floor this morning. No injuries noted. Pt c/o headache. Pt on eliquis . Family reported she is at baseline with dementia. FINDINGS: LUNGS AND PLEURA: No focal pulmonary opacity. No pulmonary edema. No pleural effusion. No pneumothorax. HEART AND MEDIASTINUM: Normal cardiopericardial silhouette. Calcified aorta. Cardiac leads noted. BONES AND SOFT TISSUES: Degenerative changes of shoulders. Osteopenia. IMPRESSION: 1. No acute cardiopulmonary process. Electronically signed by: Waddell Calk MD 12/23/2023 11:49 AM EDT RP Workstation: [id number]     Procedures   Medications Ordered in the ED  acetaminophen  (TYLENOL ) tablet 650 mg (has no administration in time range)    Clinical Course as of 12/23/23 1344  Fri Dec 23, 2023  1214 CBC No leukocytosis.  No anemia [TY]  1214 Comprehensive metabolic panel(!) Hyperglycemia, does not appear to be in DKA.  Normal electrolytes.  Appears at baseline renal function compared to prior.  No transaminitis to suggest acute hepatic injury [TY]  1215 CK Total: 63 Rhabdo unlikely [TY]  1215 Patient's checks x-Spike and pelvis x-Narvaiz independent reviewed by myself.  No obvious acute traumatic injuries.   No pneumothorax. [TY]  1223 CT HEAD WO CONTRAST I do not appreciate obvious intracranial hemorrhage or traumatic injury on my independent review of images [TY]  1224 CT CERVICAL SPINE WO CONTRAST Do not appreciate obvious cervical fracture on my independent review [TY]  1226 CT HEAD WO CONTRAST CT head:  1.  No evidence of an acute intracranial abnormality. 2. Parenchymal atrophy and chronic small vessel ischemic disease.  CT cervical spine:  1. No evidence of an acute cervical spine fracture. 2. Nonspecific reversal of the expected cervical lordosis. 3. Levocurvature of the cervical spine. 4. Grade 1 anterolisthesis at C2-C3, C3-C4, C4-C5,  C5-C6 and C7-T1. 5. Cervical spondylosis as described. 6. 1.6 cm right thyroid  lobe nodule. Given the patient's age, a non-emergent thyroid  ultrasound may be obtained for further evaluation as clinically appropriate. Reference: J Am Coll Radiol. 2015 Feb;12(2): 143-50.   Electronically Signed   By: Rockey Childs D.O.   On: 12/23/2023 12:22   [TY]  1334 Urinalysis, Routine w reflex microscopic -Urine, Clean Catch(!) No evidence of UTI [TY]  1335 Family at bedside states that patient is essentially at baseline.  Workup today reassuring without acute injury or metabolic process.  Will discharge in stable condition with daughter. [TY]    Clinical Course User Index [TY] Neysa Caron PARAS, DO                                 Medical Decision Making This is an 83 year old female presenting emergency department after suspected fall on Eliquis .  She was found next to her recliner this morning by family members.  Does not recall specifics of how she got there though.  EMS reported stable vitals and route and that patient is at her baseline mentation as she does have a history of dementia.  She is hypertensive 162/88 here.  Physical exam largely reassuring and she has no obvious injuries or tenderness.  No obvious signs of trauma to her head.  She is  having a headache.  Will get screening labs and imaging.  See ED course for further MDM final disposition  Amount and/or Complexity of Data Reviewed Independent Historian: EMS    Details: Reported stable vitals and baseline mentation. External Data Reviewed:     Details: Patient on Eliquis .  Past medical history to include dementia, diabetes, hypertension, hyperlipidemia prior stroke diastolic CHF, CAD Labs: ordered. Decision-making details documented in ED Course. Radiology: ordered and independent interpretation performed. Decision-making details documented in ED Course. ECG/medicine tests: ordered.  Risk OTC drugs. Decision regarding hospitalization. Diagnosis or treatment significantly limited by social determinants of health. Risk Details: Lives at home with family       Final diagnoses:  None    ED Discharge Orders     None          Neysa Caron PARAS, DO 12/23/23 1345

## 2023-12-23 NOTE — ED Notes (Signed)
 Pt provided with clean new brief and chux.
